# Patient Record
Sex: Female | Born: 1950
Health system: Southern US, Community
[De-identification: ages and names within clinical notes are randomized; demographics above are authoritative.]

## PROBLEM LIST (undated history)

## (undated) DIAGNOSIS — Z955 Presence of coronary angioplasty implant and graft: Secondary | ICD-10-CM

## (undated) DIAGNOSIS — Z9289 Personal history of other medical treatment: Secondary | ICD-10-CM

## (undated) DIAGNOSIS — C801 Malignant (primary) neoplasm, unspecified: Secondary | ICD-10-CM

## (undated) DIAGNOSIS — Z8719 Personal history of other diseases of the digestive system: Secondary | ICD-10-CM

## (undated) DIAGNOSIS — G4730 Sleep apnea, unspecified: Secondary | ICD-10-CM

## (undated) DIAGNOSIS — C3490 Malignant neoplasm of unspecified part of unspecified bronchus or lung: Secondary | ICD-10-CM

## (undated) DIAGNOSIS — I509 Heart failure, unspecified: Secondary | ICD-10-CM

## (undated) DIAGNOSIS — K219 Gastro-esophageal reflux disease without esophagitis: Secondary | ICD-10-CM

## (undated) DIAGNOSIS — M1990 Unspecified osteoarthritis, unspecified site: Secondary | ICD-10-CM

## (undated) DIAGNOSIS — E119 Type 2 diabetes mellitus without complications: Secondary | ICD-10-CM

## (undated) DIAGNOSIS — I214 Non-ST elevation (NSTEMI) myocardial infarction: Secondary | ICD-10-CM

## (undated) DIAGNOSIS — I10 Essential (primary) hypertension: Secondary | ICD-10-CM

## (undated) DIAGNOSIS — E7800 Pure hypercholesterolemia, unspecified: Secondary | ICD-10-CM

## (undated) DIAGNOSIS — I480 Paroxysmal atrial fibrillation: Secondary | ICD-10-CM

## (undated) DIAGNOSIS — I2510 Atherosclerotic heart disease of native coronary artery without angina pectoris: Secondary | ICD-10-CM

## (undated) DIAGNOSIS — I739 Peripheral vascular disease, unspecified: Secondary | ICD-10-CM

## (undated) HISTORY — PX: BREAST EXCISIONAL BIOPSY: SUR124

## (undated) HISTORY — PX: CORONARY ANGIOPLASTY WITH STENT PLACEMENT: SHX49

## (undated) HISTORY — DX: Pure hypercholesterolemia, unspecified: E78.00

## (undated) HISTORY — PX: DILATION AND CURETTAGE OF UTERUS: SHX78

## (undated) HISTORY — PX: JOINT REPLACEMENT: SHX530

## (undated) HISTORY — PX: TONSILLECTOMY: SUR1361

## (undated) HISTORY — PX: LAPAROSCOPIC CHOLECYSTECTOMY: SUR755

## (undated) HISTORY — PX: FRACTURE SURGERY: SHX138

## (undated) HISTORY — DX: Essential (primary) hypertension: I10

## (undated) HISTORY — DX: Heart failure, unspecified: I50.9

## (undated) HISTORY — PX: KNEE ARTHROSCOPY: SHX127

---

## 1988-09-27 HISTORY — PX: ABDOMINAL HYSTERECTOMY: SHX81

## 1991-09-28 HISTORY — PX: HAMMER TOE SURGERY: SHX385

## 1998-03-15 ENCOUNTER — Ambulatory Visit (HOSPITAL_COMMUNITY): Admission: RE | Admit: 1998-03-15 | Discharge: 1998-03-15 | Payer: Self-pay | Admitting: Orthopedic Surgery

## 1998-06-11 ENCOUNTER — Ambulatory Visit (HOSPITAL_COMMUNITY): Admission: RE | Admit: 1998-06-11 | Discharge: 1998-06-11 | Payer: Self-pay | Admitting: *Deleted

## 1999-07-14 ENCOUNTER — Encounter: Payer: Self-pay | Admitting: Internal Medicine

## 1999-07-14 ENCOUNTER — Encounter: Admission: RE | Admit: 1999-07-14 | Discharge: 1999-07-14 | Payer: Self-pay | Admitting: Internal Medicine

## 2000-07-18 ENCOUNTER — Encounter: Payer: Self-pay | Admitting: Gynecology

## 2000-07-18 ENCOUNTER — Encounter: Admission: RE | Admit: 2000-07-18 | Discharge: 2000-07-18 | Payer: Self-pay | Admitting: Gynecology

## 2001-07-19 ENCOUNTER — Encounter: Admission: RE | Admit: 2001-07-19 | Discharge: 2001-07-19 | Payer: Self-pay | Admitting: Gynecology

## 2001-07-19 ENCOUNTER — Encounter: Payer: Self-pay | Admitting: Gynecology

## 2001-07-26 ENCOUNTER — Ambulatory Visit (HOSPITAL_COMMUNITY): Admission: RE | Admit: 2001-07-26 | Discharge: 2001-07-26 | Payer: Self-pay | Admitting: Internal Medicine

## 2001-08-22 ENCOUNTER — Other Ambulatory Visit: Admission: RE | Admit: 2001-08-22 | Discharge: 2001-08-22 | Payer: Self-pay | Admitting: Gynecology

## 2001-09-27 HISTORY — PX: TOTAL KNEE ARTHROPLASTY: SHX125

## 2001-12-18 ENCOUNTER — Encounter: Payer: Self-pay | Admitting: Internal Medicine

## 2001-12-18 ENCOUNTER — Encounter: Admission: RE | Admit: 2001-12-18 | Discharge: 2001-12-18 | Payer: Self-pay | Admitting: Internal Medicine

## 2002-02-20 HISTORY — PX: CARDIAC CATHETERIZATION: SHX172

## 2002-04-02 ENCOUNTER — Ambulatory Visit (HOSPITAL_COMMUNITY): Admission: RE | Admit: 2002-04-02 | Discharge: 2002-04-02 | Payer: Self-pay | Admitting: Gastroenterology

## 2002-04-05 ENCOUNTER — Emergency Department (HOSPITAL_COMMUNITY): Admission: EM | Admit: 2002-04-05 | Discharge: 2002-04-06 | Payer: Self-pay | Admitting: Emergency Medicine

## 2002-04-06 ENCOUNTER — Ambulatory Visit (HOSPITAL_COMMUNITY): Admission: RE | Admit: 2002-04-06 | Discharge: 2002-04-06 | Payer: Self-pay | Admitting: Gastroenterology

## 2002-07-20 ENCOUNTER — Encounter: Admission: RE | Admit: 2002-07-20 | Discharge: 2002-07-20 | Payer: Self-pay | Admitting: Gynecology

## 2002-07-20 ENCOUNTER — Encounter: Payer: Self-pay | Admitting: Gynecology

## 2002-08-15 ENCOUNTER — Other Ambulatory Visit: Admission: RE | Admit: 2002-08-15 | Discharge: 2002-08-15 | Payer: Self-pay | Admitting: Gynecology

## 2002-12-05 ENCOUNTER — Encounter: Payer: Self-pay | Admitting: Gastroenterology

## 2002-12-05 ENCOUNTER — Encounter: Admission: RE | Admit: 2002-12-05 | Discharge: 2002-12-05 | Payer: Self-pay | Admitting: Gastroenterology

## 2003-05-13 ENCOUNTER — Ambulatory Visit (HOSPITAL_BASED_OUTPATIENT_CLINIC_OR_DEPARTMENT_OTHER): Admission: RE | Admit: 2003-05-13 | Discharge: 2003-05-13 | Payer: Self-pay | Admitting: Urology

## 2003-05-24 ENCOUNTER — Encounter: Payer: Self-pay | Admitting: Orthopedic Surgery

## 2003-05-28 ENCOUNTER — Inpatient Hospital Stay (HOSPITAL_COMMUNITY): Admission: RE | Admit: 2003-05-28 | Discharge: 2003-06-04 | Payer: Self-pay | Admitting: Orthopedic Surgery

## 2003-05-28 ENCOUNTER — Encounter: Payer: Self-pay | Admitting: Orthopedic Surgery

## 2003-07-22 ENCOUNTER — Ambulatory Visit (HOSPITAL_COMMUNITY): Admission: RE | Admit: 2003-07-22 | Discharge: 2003-07-22 | Payer: Self-pay | Admitting: Orthopedic Surgery

## 2003-07-24 ENCOUNTER — Encounter: Admission: RE | Admit: 2003-07-24 | Discharge: 2003-07-24 | Payer: Self-pay | Admitting: Gynecology

## 2003-09-09 ENCOUNTER — Other Ambulatory Visit: Admission: RE | Admit: 2003-09-09 | Discharge: 2003-09-09 | Payer: Self-pay | Admitting: Gynecology

## 2003-12-31 ENCOUNTER — Inpatient Hospital Stay (HOSPITAL_COMMUNITY): Admission: RE | Admit: 2003-12-31 | Discharge: 2004-01-02 | Payer: Self-pay | Admitting: Orthopedic Surgery

## 2004-08-17 ENCOUNTER — Encounter: Admission: RE | Admit: 2004-08-17 | Discharge: 2004-08-17 | Payer: Self-pay | Admitting: Gastroenterology

## 2004-08-21 ENCOUNTER — Ambulatory Visit (HOSPITAL_COMMUNITY): Admission: RE | Admit: 2004-08-21 | Discharge: 2004-08-21 | Payer: Self-pay | Admitting: Gynecology

## 2004-09-04 ENCOUNTER — Ambulatory Visit (HOSPITAL_COMMUNITY): Admission: RE | Admit: 2004-09-04 | Discharge: 2004-09-04 | Payer: Self-pay | Admitting: Gastroenterology

## 2004-10-06 ENCOUNTER — Inpatient Hospital Stay (HOSPITAL_COMMUNITY): Admission: RE | Admit: 2004-10-06 | Discharge: 2004-10-09 | Payer: Self-pay | Admitting: Orthopedic Surgery

## 2005-01-21 ENCOUNTER — Emergency Department (HOSPITAL_COMMUNITY): Admission: EM | Admit: 2005-01-21 | Discharge: 2005-01-22 | Payer: Self-pay | Admitting: Emergency Medicine

## 2005-09-27 HISTORY — PX: TOTAL KNEE REVISION: SHX996

## 2005-09-28 ENCOUNTER — Encounter: Admission: RE | Admit: 2005-09-28 | Discharge: 2005-09-28 | Payer: Self-pay | Admitting: Gynecology

## 2005-09-28 ENCOUNTER — Other Ambulatory Visit: Admission: RE | Admit: 2005-09-28 | Discharge: 2005-09-28 | Payer: Self-pay | Admitting: Gynecology

## 2006-01-12 ENCOUNTER — Encounter: Admission: RE | Admit: 2006-01-12 | Discharge: 2006-01-12 | Payer: Self-pay | Admitting: Orthopedic Surgery

## 2006-02-01 ENCOUNTER — Encounter (INDEPENDENT_AMBULATORY_CARE_PROVIDER_SITE_OTHER): Payer: Self-pay | Admitting: Specialist

## 2006-02-01 ENCOUNTER — Inpatient Hospital Stay (HOSPITAL_COMMUNITY): Admission: RE | Admit: 2006-02-01 | Discharge: 2006-02-08 | Payer: Self-pay | Admitting: Orthopedic Surgery

## 2006-02-02 ENCOUNTER — Encounter (INDEPENDENT_AMBULATORY_CARE_PROVIDER_SITE_OTHER): Payer: Self-pay | Admitting: Cardiovascular Disease

## 2006-02-04 ENCOUNTER — Ambulatory Visit: Payer: Self-pay | Admitting: Physical Medicine & Rehabilitation

## 2006-10-04 ENCOUNTER — Encounter: Admission: RE | Admit: 2006-10-04 | Discharge: 2006-10-04 | Payer: Self-pay | Admitting: Gynecology

## 2006-10-31 ENCOUNTER — Other Ambulatory Visit: Admission: RE | Admit: 2006-10-31 | Discharge: 2006-10-31 | Payer: Self-pay | Admitting: Gynecology

## 2006-12-02 ENCOUNTER — Emergency Department (HOSPITAL_COMMUNITY): Admission: EM | Admit: 2006-12-02 | Discharge: 2006-12-02 | Payer: Self-pay | Admitting: Emergency Medicine

## 2007-04-10 ENCOUNTER — Encounter: Admission: RE | Admit: 2007-04-10 | Discharge: 2007-04-10 | Payer: Self-pay | Admitting: Orthopedic Surgery

## 2008-01-03 ENCOUNTER — Ambulatory Visit (HOSPITAL_COMMUNITY): Admission: RE | Admit: 2008-01-03 | Discharge: 2008-01-03 | Payer: Self-pay | Admitting: Gynecology

## 2008-02-14 ENCOUNTER — Emergency Department (HOSPITAL_COMMUNITY): Admission: EM | Admit: 2008-02-14 | Discharge: 2008-02-14 | Payer: Self-pay | Admitting: Family Medicine

## 2008-02-15 ENCOUNTER — Ambulatory Visit: Payer: Self-pay | Admitting: *Deleted

## 2008-04-22 ENCOUNTER — Emergency Department (HOSPITAL_COMMUNITY): Admission: EM | Admit: 2008-04-22 | Discharge: 2008-04-22 | Payer: Self-pay | Admitting: Emergency Medicine

## 2008-05-30 ENCOUNTER — Ambulatory Visit: Payer: Self-pay | Admitting: Internal Medicine

## 2008-05-30 LAB — CONVERTED CEMR LAB
Basophils Absolute: 0 10*3/uL (ref 0.0–0.1)
Hemoglobin: 13.1 g/dL (ref 12.0–15.0)
Lymphocytes Relative: 26 % (ref 12–46)
Neutro Abs: 4.2 10*3/uL (ref 1.7–7.7)
Neutrophils Relative %: 64 % (ref 43–77)
Platelets: 230 10*3/uL (ref 150–400)
RDW: 13.4 % (ref 11.5–15.5)
TSH: 1.261 microintl units/mL (ref 0.350–4.50)

## 2008-05-31 ENCOUNTER — Ambulatory Visit: Payer: Self-pay | Admitting: Internal Medicine

## 2008-07-03 ENCOUNTER — Encounter: Payer: Self-pay | Admitting: Internal Medicine

## 2008-07-03 ENCOUNTER — Ambulatory Visit: Payer: Self-pay | Admitting: Internal Medicine

## 2008-10-10 ENCOUNTER — Ambulatory Visit: Payer: Self-pay | Admitting: Internal Medicine

## 2008-10-10 LAB — CONVERTED CEMR LAB
Cholesterol: 201 mg/dL — ABNORMAL HIGH (ref 0–200)
Potassium: 4 meq/L (ref 3.5–5.3)
Sodium: 141 meq/L (ref 135–145)
Total CHOL/HDL Ratio: 4
Triglycerides: 84 mg/dL (ref <150)
VLDL: 17 mg/dL (ref 0–40)

## 2008-10-11 ENCOUNTER — Encounter (INDEPENDENT_AMBULATORY_CARE_PROVIDER_SITE_OTHER): Payer: Self-pay | Admitting: Internal Medicine

## 2008-11-02 ENCOUNTER — Ambulatory Visit: Payer: Self-pay | Admitting: Pulmonary Disease

## 2008-11-02 ENCOUNTER — Inpatient Hospital Stay (HOSPITAL_COMMUNITY): Admission: EM | Admit: 2008-11-02 | Discharge: 2008-11-05 | Payer: Self-pay | Admitting: Emergency Medicine

## 2008-11-04 ENCOUNTER — Encounter (INDEPENDENT_AMBULATORY_CARE_PROVIDER_SITE_OTHER): Payer: Self-pay | Admitting: Cardiology

## 2008-11-04 HISTORY — PX: CARDIAC CATHETERIZATION: SHX172

## 2008-11-06 ENCOUNTER — Ambulatory Visit: Payer: Self-pay | Admitting: Internal Medicine

## 2008-11-06 ENCOUNTER — Telehealth (INDEPENDENT_AMBULATORY_CARE_PROVIDER_SITE_OTHER): Payer: Self-pay | Admitting: *Deleted

## 2008-11-13 ENCOUNTER — Ambulatory Visit: Payer: Self-pay | Admitting: Internal Medicine

## 2008-11-27 ENCOUNTER — Ambulatory Visit: Payer: Self-pay | Admitting: Pulmonary Disease

## 2008-11-27 ENCOUNTER — Telehealth (INDEPENDENT_AMBULATORY_CARE_PROVIDER_SITE_OTHER): Payer: Self-pay | Admitting: *Deleted

## 2008-11-27 DIAGNOSIS — Z794 Long term (current) use of insulin: Secondary | ICD-10-CM

## 2008-11-27 DIAGNOSIS — IMO0001 Reserved for inherently not codable concepts without codable children: Secondary | ICD-10-CM | POA: Insufficient documentation

## 2008-11-27 DIAGNOSIS — E119 Type 2 diabetes mellitus without complications: Secondary | ICD-10-CM

## 2008-11-27 DIAGNOSIS — I10 Essential (primary) hypertension: Secondary | ICD-10-CM | POA: Insufficient documentation

## 2008-11-27 DIAGNOSIS — J984 Other disorders of lung: Secondary | ICD-10-CM | POA: Insufficient documentation

## 2008-12-16 ENCOUNTER — Ambulatory Visit: Payer: Self-pay | Admitting: Cardiology

## 2008-12-20 ENCOUNTER — Encounter: Payer: Self-pay | Admitting: Pulmonary Disease

## 2008-12-25 ENCOUNTER — Ambulatory Visit (HOSPITAL_COMMUNITY): Admission: RE | Admit: 2008-12-25 | Discharge: 2008-12-25 | Payer: Self-pay | Admitting: Pulmonary Disease

## 2009-01-01 ENCOUNTER — Telehealth: Payer: Self-pay | Admitting: Pulmonary Disease

## 2009-01-02 ENCOUNTER — Ambulatory Visit: Payer: Self-pay | Admitting: Pulmonary Disease

## 2009-01-06 ENCOUNTER — Ambulatory Visit (HOSPITAL_COMMUNITY): Admission: RE | Admit: 2009-01-06 | Discharge: 2009-01-06 | Payer: Self-pay | Admitting: Internal Medicine

## 2009-01-08 ENCOUNTER — Ambulatory Visit (HOSPITAL_COMMUNITY): Admission: RE | Admit: 2009-01-08 | Discharge: 2009-01-08 | Payer: Self-pay | Admitting: Pulmonary Disease

## 2009-01-08 ENCOUNTER — Encounter: Payer: Self-pay | Admitting: Pulmonary Disease

## 2009-01-08 ENCOUNTER — Encounter (INDEPENDENT_AMBULATORY_CARE_PROVIDER_SITE_OTHER): Payer: Self-pay | Admitting: Interventional Radiology

## 2009-01-13 ENCOUNTER — Ambulatory Visit: Payer: Self-pay | Admitting: Pulmonary Disease

## 2009-01-13 DIAGNOSIS — C3492 Malignant neoplasm of unspecified part of left bronchus or lung: Secondary | ICD-10-CM | POA: Insufficient documentation

## 2009-01-17 ENCOUNTER — Ambulatory Visit: Admission: RE | Admit: 2009-01-17 | Discharge: 2009-01-17 | Payer: Self-pay | Admitting: Pulmonary Disease

## 2009-01-17 ENCOUNTER — Encounter: Payer: Self-pay | Admitting: Pulmonary Disease

## 2009-01-22 ENCOUNTER — Ambulatory Visit: Payer: Self-pay | Admitting: Thoracic Surgery

## 2009-01-25 DIAGNOSIS — C801 Malignant (primary) neoplasm, unspecified: Secondary | ICD-10-CM

## 2009-01-25 HISTORY — DX: Malignant (primary) neoplasm, unspecified: C80.1

## 2009-01-25 HISTORY — PX: VIDEO ASSISTED THORACOSCOPY (VATS)/ LOBECTOMY: SHX6169

## 2009-02-11 ENCOUNTER — Encounter: Payer: Self-pay | Admitting: Pulmonary Disease

## 2009-02-11 ENCOUNTER — Encounter: Payer: Self-pay | Admitting: Thoracic Surgery

## 2009-02-11 ENCOUNTER — Inpatient Hospital Stay (HOSPITAL_COMMUNITY): Admission: RE | Admit: 2009-02-11 | Discharge: 2009-02-17 | Payer: Self-pay | Admitting: Thoracic Surgery

## 2009-02-11 ENCOUNTER — Ambulatory Visit: Payer: Self-pay | Admitting: Thoracic Surgery

## 2009-02-26 ENCOUNTER — Ambulatory Visit: Payer: Self-pay | Admitting: Thoracic Surgery

## 2009-02-26 ENCOUNTER — Encounter: Admission: RE | Admit: 2009-02-26 | Discharge: 2009-02-26 | Payer: Self-pay | Admitting: Thoracic Surgery

## 2009-03-05 ENCOUNTER — Ambulatory Visit: Payer: Self-pay | Admitting: Thoracic Surgery

## 2009-03-05 ENCOUNTER — Ambulatory Visit: Payer: Self-pay | Admitting: Internal Medicine

## 2009-03-05 ENCOUNTER — Encounter: Admission: RE | Admit: 2009-03-05 | Discharge: 2009-03-05 | Payer: Self-pay | Admitting: Thoracic Surgery

## 2009-03-19 LAB — CBC WITH DIFFERENTIAL/PLATELET
BASO%: 0.5 % (ref 0.0–2.0)
Eosinophils Absolute: 0.3 10*3/uL (ref 0.0–0.5)
MCHC: 35.4 g/dL (ref 31.5–36.0)
MCV: 84.9 fL (ref 79.5–101.0)
MONO#: 0.5 10*3/uL (ref 0.1–0.9)
MONO%: 6.3 % (ref 0.0–14.0)
NEUT#: 5.4 10*3/uL (ref 1.5–6.5)
RBC: 4.47 10*6/uL (ref 3.70–5.45)
RDW: 13.3 % (ref 11.2–14.5)
WBC: 7.6 10*3/uL (ref 3.9–10.3)

## 2009-03-19 LAB — COMPREHENSIVE METABOLIC PANEL
ALT: 17 U/L (ref 0–35)
Albumin: 3.8 g/dL (ref 3.5–5.2)
Alkaline Phosphatase: 96 U/L (ref 39–117)
Glucose, Bld: 194 mg/dL — ABNORMAL HIGH (ref 70–99)
Potassium: 3.8 mEq/L (ref 3.5–5.3)
Sodium: 138 mEq/L (ref 135–145)
Total Protein: 7.8 g/dL (ref 6.0–8.3)

## 2009-03-26 ENCOUNTER — Encounter: Admission: RE | Admit: 2009-03-26 | Discharge: 2009-03-26 | Payer: Self-pay | Admitting: Thoracic Surgery

## 2009-03-26 ENCOUNTER — Ambulatory Visit: Payer: Self-pay | Admitting: Thoracic Surgery

## 2009-05-28 ENCOUNTER — Encounter: Admission: RE | Admit: 2009-05-28 | Discharge: 2009-05-28 | Payer: Self-pay | Admitting: Thoracic Surgery

## 2009-05-28 ENCOUNTER — Ambulatory Visit: Payer: Self-pay | Admitting: Thoracic Surgery

## 2009-05-28 ENCOUNTER — Ambulatory Visit: Payer: Self-pay | Admitting: Internal Medicine

## 2009-05-29 ENCOUNTER — Encounter (INDEPENDENT_AMBULATORY_CARE_PROVIDER_SITE_OTHER): Payer: Self-pay | Admitting: Internal Medicine

## 2009-07-08 ENCOUNTER — Encounter: Admission: RE | Admit: 2009-07-08 | Discharge: 2009-07-08 | Payer: Self-pay | Admitting: Internal Medicine

## 2009-07-09 ENCOUNTER — Ambulatory Visit: Payer: Self-pay | Admitting: Internal Medicine

## 2009-07-09 ENCOUNTER — Encounter: Payer: Self-pay | Admitting: Internal Medicine

## 2009-07-09 ENCOUNTER — Other Ambulatory Visit: Admission: RE | Admit: 2009-07-09 | Discharge: 2009-07-09 | Payer: Self-pay | Admitting: Internal Medicine

## 2009-07-09 LAB — CONVERTED CEMR LAB
CO2: 23 meq/L (ref 19–32)
Calcium: 9 mg/dL (ref 8.4–10.5)
Creatinine, Ser: 0.69 mg/dL (ref 0.40–1.20)
Glucose, Bld: 174 mg/dL — ABNORMAL HIGH (ref 70–99)
Microalb, Ur: 1.24 mg/dL (ref 0.00–1.89)
Sodium: 141 meq/L (ref 135–145)
Total Bilirubin: 0.5 mg/dL (ref 0.3–1.2)
Total Protein: 7.1 g/dL (ref 6.0–8.3)

## 2009-07-10 ENCOUNTER — Ambulatory Visit: Payer: Self-pay | Admitting: Internal Medicine

## 2009-08-30 ENCOUNTER — Emergency Department (HOSPITAL_COMMUNITY): Admission: EM | Admit: 2009-08-30 | Discharge: 2009-08-31 | Payer: Self-pay | Admitting: Emergency Medicine

## 2009-09-09 ENCOUNTER — Emergency Department (HOSPITAL_COMMUNITY): Admission: EM | Admit: 2009-09-09 | Discharge: 2009-09-09 | Payer: Self-pay | Admitting: Emergency Medicine

## 2009-09-10 ENCOUNTER — Ambulatory Visit: Payer: Self-pay | Admitting: Internal Medicine

## 2009-09-12 LAB — CBC WITH DIFFERENTIAL/PLATELET
Basophils Absolute: 0 10*3/uL (ref 0.0–0.1)
Eosinophils Absolute: 0.1 10*3/uL (ref 0.0–0.5)
HGB: 14.2 g/dL (ref 11.6–15.9)
MCV: 83.7 fL (ref 79.5–101.0)
MONO#: 0.4 10*3/uL (ref 0.1–0.9)
NEUT#: 4.9 10*3/uL (ref 1.5–6.5)
RDW: 12.9 % (ref 11.2–14.5)
lymph#: 1.8 10*3/uL (ref 0.9–3.3)

## 2009-09-12 LAB — COMPREHENSIVE METABOLIC PANEL
Albumin: 3.7 g/dL (ref 3.5–5.2)
CO2: 29 mEq/L (ref 19–32)
Glucose, Bld: 213 mg/dL — ABNORMAL HIGH (ref 70–99)
Potassium: 3.5 mEq/L (ref 3.5–5.3)
Sodium: 138 mEq/L (ref 135–145)
Total Protein: 8.6 g/dL — ABNORMAL HIGH (ref 6.0–8.3)

## 2009-09-30 ENCOUNTER — Encounter: Admission: RE | Admit: 2009-09-30 | Discharge: 2009-09-30 | Payer: Self-pay | Admitting: Thoracic Surgery

## 2009-09-30 ENCOUNTER — Ambulatory Visit: Payer: Self-pay | Admitting: Thoracic Surgery

## 2009-10-02 ENCOUNTER — Ambulatory Visit: Payer: Self-pay | Admitting: Internal Medicine

## 2009-10-02 LAB — CONVERTED CEMR LAB
ALT: 16 units/L (ref 0–35)
AST: 18 units/L (ref 0–37)
Albumin: 4.2 g/dL (ref 3.5–5.2)
Alkaline Phosphatase: 105 units/L (ref 39–117)
BUN: 9 mg/dL (ref 6–23)
Calcium: 9.2 mg/dL (ref 8.4–10.5)
Chloride: 96 meq/L (ref 96–112)
Eosinophils Relative: 1 % (ref 0–5)
HCT: 39.9 % (ref 36.0–46.0)
Lymphocytes Relative: 19 % (ref 12–46)
Lymphs Abs: 1.4 10*3/uL (ref 0.7–4.0)
Neutro Abs: 5.4 10*3/uL (ref 1.7–7.7)
Platelets: 240 10*3/uL (ref 150–400)
Potassium: 3.4 meq/L — ABNORMAL LOW (ref 3.5–5.3)
Sodium: 137 meq/L (ref 135–145)
Total Protein: 7.9 g/dL (ref 6.0–8.3)
WBC: 7.3 10*3/uL (ref 4.0–10.5)

## 2009-10-07 ENCOUNTER — Emergency Department (HOSPITAL_COMMUNITY): Admission: EM | Admit: 2009-10-07 | Discharge: 2009-10-07 | Payer: Self-pay | Admitting: Emergency Medicine

## 2009-10-15 ENCOUNTER — Ambulatory Visit: Payer: Self-pay | Admitting: Internal Medicine

## 2009-11-21 ENCOUNTER — Ambulatory Visit: Payer: Self-pay | Admitting: Internal Medicine

## 2009-11-21 LAB — CONVERTED CEMR LAB
CRP: 2.5 mg/dL — ABNORMAL HIGH (ref <0.6)
LDL Cholesterol: 76 mg/dL (ref 0–99)
VLDL: 16 mg/dL (ref 0–40)

## 2010-01-08 ENCOUNTER — Ambulatory Visit (HOSPITAL_COMMUNITY): Admission: RE | Admit: 2010-01-08 | Discharge: 2010-01-08 | Payer: Self-pay | Admitting: Internal Medicine

## 2010-02-24 ENCOUNTER — Ambulatory Visit: Payer: Self-pay | Admitting: Vascular Surgery

## 2010-02-24 ENCOUNTER — Inpatient Hospital Stay (HOSPITAL_COMMUNITY): Admission: EM | Admit: 2010-02-24 | Discharge: 2010-02-25 | Payer: Self-pay | Admitting: Emergency Medicine

## 2010-02-24 ENCOUNTER — Encounter (INDEPENDENT_AMBULATORY_CARE_PROVIDER_SITE_OTHER): Payer: Self-pay | Admitting: Internal Medicine

## 2010-03-11 ENCOUNTER — Ambulatory Visit: Payer: Self-pay | Admitting: Internal Medicine

## 2010-03-13 LAB — COMPREHENSIVE METABOLIC PANEL
ALT: 18 U/L (ref 0–35)
AST: 21 U/L (ref 0–37)
Albumin: 3.6 g/dL (ref 3.5–5.2)
Alkaline Phosphatase: 77 U/L (ref 39–117)
Calcium: 9.6 mg/dL (ref 8.4–10.5)
Chloride: 99 mEq/L (ref 96–112)
Potassium: 3.9 mEq/L (ref 3.5–5.3)
Sodium: 137 mEq/L (ref 135–145)
Total Protein: 7.7 g/dL (ref 6.0–8.3)

## 2010-03-13 LAB — CBC WITH DIFFERENTIAL/PLATELET
BASO%: 0.5 % (ref 0.0–2.0)
EOS%: 2 % (ref 0.0–7.0)
HCT: 39.2 % (ref 34.8–46.6)
HGB: 13.5 g/dL (ref 11.6–15.9)
MCH: 29 pg (ref 25.1–34.0)
MCHC: 34.4 g/dL (ref 31.5–36.0)
MONO#: 0.4 10*3/uL (ref 0.1–0.9)
NEUT%: 64.1 % (ref 38.4–76.8)
RDW: 12.8 % (ref 11.2–14.5)
WBC: 5.9 10*3/uL (ref 3.9–10.3)
lymph#: 1.5 10*3/uL (ref 0.9–3.3)

## 2010-03-18 ENCOUNTER — Ambulatory Visit: Payer: Self-pay | Admitting: Internal Medicine

## 2010-03-18 LAB — CONVERTED CEMR LAB: Microalb, Ur: 1.29 mg/dL (ref 0.00–1.89)

## 2010-04-22 ENCOUNTER — Ambulatory Visit: Payer: Self-pay | Admitting: Internal Medicine

## 2010-09-11 ENCOUNTER — Ambulatory Visit: Payer: Self-pay | Admitting: Internal Medicine

## 2010-09-14 ENCOUNTER — Ambulatory Visit (HOSPITAL_COMMUNITY)
Admission: RE | Admit: 2010-09-14 | Discharge: 2010-09-14 | Payer: Self-pay | Source: Home / Self Care | Attending: Internal Medicine | Admitting: Internal Medicine

## 2010-09-14 LAB — CBC WITH DIFFERENTIAL/PLATELET
BASO%: 0.6 % (ref 0.0–2.0)
Basophils Absolute: 0 10*3/uL (ref 0.0–0.1)
EOS%: 2.4 % (ref 0.0–7.0)
MCH: 29.9 pg (ref 25.1–34.0)
MCHC: 34.6 g/dL (ref 31.5–36.0)
MCV: 86.3 fL (ref 79.5–101.0)
MONO%: 6.6 % (ref 0.0–14.0)
RBC: 4.41 10*6/uL (ref 3.70–5.45)
RDW: 13.1 % (ref 11.2–14.5)
lymph#: 1.3 10*3/uL (ref 0.9–3.3)

## 2010-09-14 LAB — CMP (CANCER CENTER ONLY)
ALT(SGPT): 18 U/L (ref 10–47)
AST: 23 U/L (ref 11–38)
Albumin: 3.4 g/dL (ref 3.3–5.5)
Alkaline Phosphatase: 93 U/L — ABNORMAL HIGH (ref 26–84)
BUN, Bld: 14 mg/dL (ref 7–22)
Potassium: 3.9 mEq/L (ref 3.3–4.7)
Sodium: 134 mEq/L (ref 128–145)

## 2010-09-16 ENCOUNTER — Encounter: Payer: Self-pay | Admitting: Pulmonary Disease

## 2010-09-30 ENCOUNTER — Encounter (INDEPENDENT_AMBULATORY_CARE_PROVIDER_SITE_OTHER): Payer: Self-pay | Admitting: *Deleted

## 2010-09-30 LAB — CONVERTED CEMR LAB
AST: 15 units/L (ref 0–37)
Albumin: 3.9 g/dL (ref 3.5–5.2)
Alkaline Phosphatase: 82 units/L (ref 39–117)
LDL Cholesterol: 70 mg/dL (ref 0–99)
Potassium: 4.2 meq/L (ref 3.5–5.3)
Sodium: 137 meq/L (ref 135–145)
Total Bilirubin: 0.6 mg/dL (ref 0.3–1.2)
Total Protein: 7.4 g/dL (ref 6.0–8.3)
Triglycerides: 70 mg/dL (ref <150)
VLDL: 14 mg/dL (ref 0–40)

## 2010-10-04 ENCOUNTER — Emergency Department (HOSPITAL_COMMUNITY)
Admission: EM | Admit: 2010-10-04 | Discharge: 2010-10-04 | Payer: Self-pay | Source: Home / Self Care | Admitting: Emergency Medicine

## 2010-10-08 ENCOUNTER — Emergency Department (HOSPITAL_COMMUNITY)
Admission: EM | Admit: 2010-10-08 | Discharge: 2010-10-08 | Payer: Self-pay | Source: Home / Self Care | Admitting: Emergency Medicine

## 2010-10-12 LAB — COMPREHENSIVE METABOLIC PANEL
ALT: 17 U/L (ref 0–35)
AST: 22 U/L (ref 0–37)
Albumin: 3.8 g/dL (ref 3.5–5.2)
Alkaline Phosphatase: 93 U/L (ref 39–117)
BUN: 12 mg/dL (ref 6–23)
CO2: 30 mEq/L (ref 19–32)
Calcium: 9.8 mg/dL (ref 8.4–10.5)
Chloride: 97 mEq/L (ref 96–112)
Creatinine, Ser: 0.95 mg/dL (ref 0.4–1.2)
GFR calc Af Amer: >60 mL/min (ref >60)
GFR calc non Af Amer: >60 mL/min (ref >60)
Glucose, Bld: 217 mg/dL — ABNORMAL HIGH (ref 70–99)
Potassium: 3.7 mEq/L (ref 3.5–5.1)
Sodium: 137 mEq/L (ref 135–145)
Total Bilirubin: 0.6 mg/dL (ref 0.3–1.2)
Total Protein: 8.5 g/dL — ABNORMAL HIGH (ref 6.0–8.3)

## 2010-10-12 LAB — CBC
HCT: 39.9 % (ref 36.0–46.0)
HCT: 42.1 % (ref 36.0–46.0)
Hemoglobin: 13.8 g/dL (ref 12.0–15.0)
Hemoglobin: 14.4 g/dL (ref 12.0–15.0)
MCH: 29.7 pg (ref 26.0–34.0)
MCH: 29.4 pg (ref 26.0–34.0)
MCHC: 34.6 g/dL (ref 30.0–36.0)
MCHC: 34.2 g/dL (ref 30.0–36.0)
MCV: 86 fL (ref 78.0–100.0)
MCV: 85.9 fL (ref 78.0–100.0)
Platelets: 224 10*3/uL (ref 150–400)
RBC: 4.64 MIL/uL (ref 3.87–5.11)
RBC: 4.9 MIL/uL (ref 3.87–5.11)
RDW: 12.5 % (ref 11.5–15.5)
WBC: 9.2 10*3/uL (ref 4.0–10.5)

## 2010-10-12 LAB — BASIC METABOLIC PANEL
BUN: 11 mg/dL (ref 6–23)
CO2: 30 mEq/L (ref 19–32)
Calcium: 9.9 mg/dL (ref 8.4–10.5)
Chloride: 97 mEq/L (ref 96–112)
Creatinine, Ser: 0.79 mg/dL (ref 0.4–1.2)
GFR calc Af Amer: >60 mL/min (ref >60)
GFR calc non Af Amer: >60 mL/min (ref >60)
Glucose, Bld: 254 mg/dL — ABNORMAL HIGH (ref 70–99)
Potassium: 3.7 mEq/L (ref 3.5–5.1)
Sodium: 137 mEq/L (ref 135–145)

## 2010-10-12 LAB — POCT CARDIAC MARKERS
CKMB, poc: 1.5 ng/mL (ref 1.0–8.0)
Myoglobin, poc: 225 ng/mL (ref 12–200)
Troponin i, poc: <0.05 ng/mL (ref 0.00–0.09)

## 2010-10-12 LAB — DIFFERENTIAL
Basophils Absolute: 0 10*3/uL (ref 0.0–0.1)
Basophils Relative: 0 % (ref 0–1)
Basophils Relative: 0 % (ref 0–1)
Eosinophils Absolute: 0.1 10*3/uL (ref 0.0–0.7)
Eosinophils Absolute: 0.1 10*3/uL (ref 0.0–0.7)
Eosinophils Relative: 1 % (ref 0–5)
Eosinophils Relative: 1 % (ref 0–5)
Lymphocytes Relative: 19 % (ref 12–46)
Lymphocytes Relative: 16 % (ref 12–46)
Lymphs Abs: 1.7 10*3/uL (ref 0.7–4.0)
Monocytes Absolute: 0.6 10*3/uL (ref 0.1–1.0)
Monocytes Absolute: 0.5 10*3/uL (ref 0.1–1.0)
Monocytes Relative: 6 % (ref 3–12)
Neutro Abs: 6.8 10*3/uL (ref 1.7–7.7)
Neutro Abs: 6.3 10*3/uL (ref 1.7–7.7)
Neutrophils Relative %: 73 % (ref 43–77)
Neutrophils Relative %: 77 % (ref 43–77)

## 2010-10-12 LAB — URINALYSIS, ROUTINE W REFLEX MICROSCOPIC
Bilirubin Urine: NEGATIVE
Hgb urine dipstick: NEGATIVE
Ketones, ur: NEGATIVE mg/dL
Nitrite: NEGATIVE
Protein, ur: NEGATIVE mg/dL
Specific Gravity, Urine: 1.009 (ref 1.005–1.030)
Urine Glucose, Fasting: 100 mg/dL — AB
Urobilinogen, UA: 0.2 mg/dL (ref 0.0–1.0)
pH: 6 (ref 5.0–8.0)

## 2010-10-12 LAB — LIPASE, BLOOD: Lipase: 21 U/L (ref 11–59)

## 2010-10-12 LAB — GLUCOSE, CAPILLARY: Glucose-Capillary: 242 mg/dL — ABNORMAL HIGH (ref 70–99)

## 2010-10-14 ENCOUNTER — Other Ambulatory Visit: Payer: Self-pay | Admitting: Gynecology

## 2010-10-16 ENCOUNTER — Other Ambulatory Visit: Payer: Self-pay | Admitting: Internal Medicine

## 2010-10-16 DIAGNOSIS — C3490 Malignant neoplasm of unspecified part of unspecified bronchus or lung: Secondary | ICD-10-CM

## 2010-10-18 ENCOUNTER — Encounter: Payer: Self-pay | Admitting: Gynecology

## 2010-10-18 ENCOUNTER — Encounter: Payer: Self-pay | Admitting: Internal Medicine

## 2010-11-27 ENCOUNTER — Other Ambulatory Visit (HOSPITAL_COMMUNITY): Payer: Self-pay | Admitting: Gynecology

## 2010-11-27 DIAGNOSIS — Z1231 Encounter for screening mammogram for malignant neoplasm of breast: Secondary | ICD-10-CM

## 2010-12-08 NOTE — Letter (Signed)
Summary: [facility]  [facility]   Imported By: Lester Menominee 09/30/2010 09:28:01  _____________________________________________________________________  External Attachment:    Type:   Image     Comment:   External Document

## 2010-12-13 LAB — COMPREHENSIVE METABOLIC PANEL
AST: 23 U/L (ref 0–37)
Albumin: 3.3 g/dL — ABNORMAL LOW (ref 3.5–5.2)
Alkaline Phosphatase: 85 U/L (ref 39–117)
Chloride: 97 mEq/L (ref 96–112)
GFR calc Af Amer: >60 mL/min (ref >60)
Potassium: 3.6 mEq/L (ref 3.5–5.1)
Total Bilirubin: 0.5 mg/dL (ref 0.3–1.2)

## 2010-12-13 LAB — URINALYSIS, ROUTINE W REFLEX MICROSCOPIC
Bilirubin Urine: NEGATIVE
Hgb urine dipstick: NEGATIVE
Protein, ur: NEGATIVE mg/dL
Specific Gravity, Urine: 1.009 (ref 1.005–1.030)
Urobilinogen, UA: 1 mg/dL (ref 0.0–1.0)

## 2010-12-13 LAB — DIFFERENTIAL
Basophils Absolute: 0 10*3/uL (ref 0.0–0.1)
Basophils Relative: 0 % (ref 0–1)
Eosinophils Absolute: 0.1 10*3/uL (ref 0.0–0.7)
Eosinophils Relative: 1 % (ref 0–5)
Monocytes Absolute: 0.3 10*3/uL (ref 0.1–1.0)

## 2010-12-13 LAB — GLUCOSE, CAPILLARY: Glucose-Capillary: 233 mg/dL — ABNORMAL HIGH (ref 70–99)

## 2010-12-13 LAB — CBC
Hemoglobin: 13.6 g/dL (ref 12.0–15.0)
MCHC: 34.7 g/dL (ref 30.0–36.0)
Platelets: 188 10*3/uL (ref 150–400)
RDW: 13.3 % (ref 11.5–15.5)

## 2010-12-14 LAB — CBC
HCT: 37.5 % (ref 36.0–46.0)
MCHC: 34.1 g/dL (ref 30.0–36.0)
Platelets: 171 10*3/uL (ref 150–400)
RBC: 4.9 MIL/uL (ref 3.87–5.11)
RDW: 13.4 % (ref 11.5–15.5)
WBC: 6.4 10*3/uL (ref 4.0–10.5)

## 2010-12-14 LAB — LIPASE, BLOOD
Lipase: 24 U/L (ref 11–59)
Lipase: 26 U/L (ref 11–59)

## 2010-12-14 LAB — COMPREHENSIVE METABOLIC PANEL
CO2: 30 mEq/L (ref 19–32)
Calcium: 9.1 mg/dL (ref 8.4–10.5)
Chloride: 99 mEq/L (ref 96–112)
Creatinine, Ser: 0.77 mg/dL (ref 0.4–1.2)
GFR calc non Af Amer: >60 mL/min (ref >60)
Glucose, Bld: 302 mg/dL — ABNORMAL HIGH (ref 70–99)
Total Bilirubin: 0.6 mg/dL (ref 0.3–1.2)

## 2010-12-14 LAB — LIPID PANEL
Cholesterol: 131 mg/dL (ref 0–200)
Total CHOL/HDL Ratio: 2.8 RATIO

## 2010-12-14 LAB — GLUCOSE, CAPILLARY
Glucose-Capillary: 209 mg/dL — ABNORMAL HIGH (ref 70–99)
Glucose-Capillary: 219 mg/dL — ABNORMAL HIGH (ref 70–99)
Glucose-Capillary: 224 mg/dL — ABNORMAL HIGH (ref 70–99)
Glucose-Capillary: 241 mg/dL — ABNORMAL HIGH (ref 70–99)
Glucose-Capillary: 245 mg/dL — ABNORMAL HIGH (ref 70–99)

## 2010-12-14 LAB — PROTIME-INR
INR: 1 (ref 0.00–1.49)
Prothrombin Time: 13.1 seconds (ref 11.6–15.2)

## 2010-12-14 LAB — POCT I-STAT, CHEM 8
Calcium, Ion: 1.08 mmol/L — ABNORMAL LOW (ref 1.12–1.32)
Chloride: 100 mEq/L (ref 96–112)
HCT: 41 % (ref 36.0–46.0)
Hemoglobin: 13.9 g/dL (ref 12.0–15.0)
TCO2: 30 mmol/L (ref 0–100)

## 2010-12-14 LAB — DIFFERENTIAL
Basophils Absolute: 0 10*3/uL (ref 0.0–0.1)
Basophils Relative: 1 % (ref 0–1)
Monocytes Relative: 5 % (ref 3–12)
Neutro Abs: 5.4 10*3/uL (ref 1.7–7.7)
Neutrophils Relative %: 76 % (ref 43–77)

## 2010-12-14 LAB — TSH: TSH: 1.83 u[IU]/mL (ref 0.350–4.500)

## 2010-12-14 LAB — CK TOTAL AND CKMB (NOT AT ARMC)
CK, MB: 1 ng/mL (ref 0.3–4.0)
Relative Index: INVALID (ref 0.0–2.5)
Relative Index: INVALID (ref 0.0–2.5)
Total CK: 70 U/L (ref 7–177)
Total CK: 82 U/L (ref 7–177)

## 2010-12-14 LAB — HEMOGLOBIN A1C: Hgb A1c MFr Bld: 8.2 % — ABNORMAL HIGH (ref <5.7)

## 2010-12-14 LAB — POCT CARDIAC MARKERS: Troponin i, poc: <0.05 ng/mL (ref 0.00–0.09)

## 2010-12-14 LAB — TROPONIN I
Troponin I: 0.01 ng/mL (ref 0.00–0.06)
Troponin I: 0.01 ng/mL (ref 0.00–0.06)
Troponin I: 0.02 ng/mL (ref 0.00–0.06)

## 2010-12-29 LAB — POCT CARDIAC MARKERS
CKMB, poc: 1.5 ng/mL (ref 1.0–8.0)
CKMB, poc: 2 ng/mL (ref 1.0–8.0)
Myoglobin, poc: 105 ng/mL (ref 12–200)
Myoglobin, poc: >500 ng/mL (ref 12–200)
Troponin i, poc: 0.09 ng/mL (ref 0.00–0.09)
Troponin i, poc: 0.09 ng/mL (ref 0.00–0.09)
Troponin i, poc: 0.13 ng/mL — ABNORMAL HIGH (ref 0.00–0.09)
Troponin i, poc: 0.15 ng/mL — ABNORMAL HIGH (ref 0.00–0.09)

## 2010-12-29 LAB — DIFFERENTIAL
Basophils Absolute: 0.1 10*3/uL (ref 0.0–0.1)
Eosinophils Relative: 1 % (ref 0–5)
Lymphocytes Relative: 18 % (ref 12–46)
Lymphs Abs: 1.3 10*3/uL (ref 0.7–4.0)
Monocytes Absolute: 0.3 10*3/uL (ref 0.1–1.0)
Monocytes Relative: 4 % (ref 3–12)
Neutro Abs: 5.9 10*3/uL (ref 1.7–7.7)

## 2010-12-29 LAB — CK TOTAL AND CKMB (NOT AT ARMC)
CK, MB: 1.2 ng/mL (ref 0.3–4.0)
Relative Index: INVALID (ref 0.0–2.5)
Total CK: 73 U/L (ref 7–177)

## 2010-12-29 LAB — BASIC METABOLIC PANEL
Calcium: 9.5 mg/dL (ref 8.4–10.5)
GFR calc Af Amer: >60 mL/min (ref >60)
GFR calc non Af Amer: >60 mL/min (ref >60)
Glucose, Bld: 220 mg/dL — ABNORMAL HIGH (ref 70–99)
Potassium: 4.2 mEq/L (ref 3.5–5.1)
Sodium: 134 mEq/L — ABNORMAL LOW (ref 135–145)

## 2010-12-29 LAB — POCT I-STAT, CHEM 8
BUN: 7 mg/dL (ref 6–23)
Calcium, Ion: 1.06 mmol/L — ABNORMAL LOW (ref 1.12–1.32)
Chloride: 101 mEq/L (ref 96–112)
Creatinine, Ser: 0.7 mg/dL (ref 0.4–1.2)
Potassium: 3.9 mEq/L (ref 3.5–5.1)
Sodium: 138 mEq/L (ref 135–145)
TCO2: 28 mmol/L (ref 0–100)

## 2010-12-29 LAB — D-DIMER, QUANTITATIVE: D-Dimer, Quant: 1.48 ug/mL-FEU — ABNORMAL HIGH (ref 0.00–0.48)

## 2010-12-29 LAB — CBC
HCT: 41.9 % (ref 36.0–46.0)
Hemoglobin: 14.4 g/dL (ref 12.0–15.0)
RBC: 4.91 MIL/uL (ref 3.87–5.11)
RDW: 12.7 % (ref 11.5–15.5)

## 2010-12-29 LAB — TROPONIN I: Troponin I: <0.01 ng/mL (ref 0.00–0.06)

## 2011-01-05 LAB — BLOOD GAS, ARTERIAL
Acid-Base Excess: 2.6 mmol/L — ABNORMAL HIGH (ref 0.0–2.0)
FIO2: 0.21 %
O2 Saturation: 98.8 %
pO2, Arterial: 104 mmHg — ABNORMAL HIGH (ref 80.0–100.0)

## 2011-01-05 LAB — URINALYSIS, ROUTINE W REFLEX MICROSCOPIC
Bilirubin Urine: NEGATIVE
Glucose, UA: NEGATIVE mg/dL
Hgb urine dipstick: NEGATIVE
Ketones, ur: NEGATIVE mg/dL
Nitrite: NEGATIVE
Specific Gravity, Urine: 1.014 (ref 1.005–1.030)
pH: 7 (ref 5.0–8.0)

## 2011-01-05 LAB — TYPE AND SCREEN: Antibody Screen: NEGATIVE

## 2011-01-05 LAB — GLUCOSE, CAPILLARY
Glucose-Capillary: 108 mg/dL — ABNORMAL HIGH (ref 70–99)
Glucose-Capillary: 129 mg/dL — ABNORMAL HIGH (ref 70–99)
Glucose-Capillary: 134 mg/dL — ABNORMAL HIGH (ref 70–99)
Glucose-Capillary: 136 mg/dL — ABNORMAL HIGH (ref 70–99)
Glucose-Capillary: 137 mg/dL — ABNORMAL HIGH (ref 70–99)
Glucose-Capillary: 140 mg/dL — ABNORMAL HIGH (ref 70–99)
Glucose-Capillary: 147 mg/dL — ABNORMAL HIGH (ref 70–99)
Glucose-Capillary: 147 mg/dL — ABNORMAL HIGH (ref 70–99)
Glucose-Capillary: 150 mg/dL — ABNORMAL HIGH (ref 70–99)
Glucose-Capillary: 153 mg/dL — ABNORMAL HIGH (ref 70–99)
Glucose-Capillary: 160 mg/dL — ABNORMAL HIGH (ref 70–99)
Glucose-Capillary: 164 mg/dL — ABNORMAL HIGH (ref 70–99)
Glucose-Capillary: 170 mg/dL — ABNORMAL HIGH (ref 70–99)
Glucose-Capillary: 173 mg/dL — ABNORMAL HIGH (ref 70–99)
Glucose-Capillary: 188 mg/dL — ABNORMAL HIGH (ref 70–99)
Glucose-Capillary: 188 mg/dL — ABNORMAL HIGH (ref 70–99)
Glucose-Capillary: 218 mg/dL — ABNORMAL HIGH (ref 70–99)
Glucose-Capillary: 312 mg/dL — ABNORMAL HIGH (ref 70–99)

## 2011-01-05 LAB — COMPREHENSIVE METABOLIC PANEL
ALT: 14 U/L (ref 0–35)
AST: 18 U/L (ref 0–37)
AST: 27 U/L (ref 0–37)
Albumin: 2.9 g/dL — ABNORMAL LOW (ref 3.5–5.2)
Alkaline Phosphatase: 112 U/L (ref 39–117)
CO2: 26 mEq/L (ref 19–32)
Calcium: 8.3 mg/dL — ABNORMAL LOW (ref 8.4–10.5)
Chloride: 101 mEq/L (ref 96–112)
Creatinine, Ser: 0.76 mg/dL (ref 0.4–1.2)
Creatinine, Ser: 0.69 mg/dL (ref 0.4–1.2)
GFR calc Af Amer: >60 mL/min (ref >60)
GFR calc Af Amer: >60 mL/min (ref >60)
GFR calc non Af Amer: >60 mL/min (ref >60)
Potassium: 4 mEq/L (ref 3.5–5.1)
Sodium: 134 mEq/L — ABNORMAL LOW (ref 135–145)
Total Bilirubin: 0.5 mg/dL (ref 0.3–1.2)
Total Protein: 6.3 g/dL (ref 6.0–8.3)

## 2011-01-05 LAB — BASIC METABOLIC PANEL
BUN: 18 mg/dL (ref 6–23)
CO2: 27 mEq/L (ref 19–32)
Calcium: 8.1 mg/dL — ABNORMAL LOW (ref 8.4–10.5)
Calcium: 8.6 mg/dL (ref 8.4–10.5)
Calcium: 8.6 mg/dL (ref 8.4–10.5)
Chloride: 100 mEq/L (ref 96–112)
Creatinine, Ser: 1 mg/dL (ref 0.4–1.2)
Creatinine, Ser: 1.08 mg/dL (ref 0.4–1.2)
GFR calc Af Amer: >60 mL/min (ref >60)
GFR calc Af Amer: >60 mL/min (ref >60)
GFR calc non Af Amer: 57 mL/min — ABNORMAL LOW (ref >60)
GFR calc non Af Amer: >60 mL/min (ref >60)
GFR calc non Af Amer: >60 mL/min (ref >60)
Glucose, Bld: 135 mg/dL — ABNORMAL HIGH (ref 70–99)
Glucose, Bld: 160 mg/dL — ABNORMAL HIGH (ref 70–99)
Potassium: 3.6 mEq/L (ref 3.5–5.1)
Potassium: 3.8 mEq/L (ref 3.5–5.1)
Potassium: 4 mEq/L (ref 3.5–5.1)
Sodium: 137 mEq/L (ref 135–145)

## 2011-01-05 LAB — CARDIAC PANEL(CRET KIN+CKTOT+MB+TROPI)
CK, MB: 2.8 ng/mL (ref 0.3–4.0)
Relative Index: 0.6 (ref 0.0–2.5)
Total CK: 500 U/L — ABNORMAL HIGH (ref 7–177)

## 2011-01-05 LAB — CBC
HCT: 28.5 % — ABNORMAL LOW (ref 36.0–46.0)
HCT: 29.4 % — ABNORMAL LOW (ref 36.0–46.0)
HCT: 38.5 % (ref 36.0–46.0)
Hemoglobin: 10.2 g/dL — ABNORMAL LOW (ref 12.0–15.0)
Hemoglobin: 10.6 g/dL — ABNORMAL LOW (ref 12.0–15.0)
MCHC: 34.3 g/dL (ref 30.0–36.0)
MCHC: 34.9 g/dL (ref 30.0–36.0)
MCV: 86.3 fL (ref 78.0–100.0)
MCV: 86.3 fL (ref 78.0–100.0)
MCV: 86.3 fL (ref 78.0–100.0)
Platelets: 179 10*3/uL (ref 150–400)
Platelets: 179 10*3/uL (ref 150–400)
RBC: 3.3 MIL/uL — ABNORMAL LOW (ref 3.87–5.11)
RBC: 3.41 MIL/uL — ABNORMAL LOW (ref 3.87–5.11)
RBC: 3.45 MIL/uL — ABNORMAL LOW (ref 3.87–5.11)
RBC: 4.46 MIL/uL (ref 3.87–5.11)
RDW: 13.2 % (ref 11.5–15.5)
RDW: 13.5 % (ref 11.5–15.5)
WBC: 9 10*3/uL (ref 4.0–10.5)
WBC: 9.1 10*3/uL (ref 4.0–10.5)
WBC: 7.1 10*3/uL (ref 4.0–10.5)

## 2011-01-05 LAB — POCT I-STAT 3, ART BLOOD GAS (G3+)
O2 Saturation: 99 %
Patient temperature: 98.3
TCO2: 26 mmol/L (ref 0–100)

## 2011-01-05 LAB — PHOSPHORUS: Phosphorus: 3.9 mg/dL (ref 2.3–4.6)

## 2011-01-05 LAB — APTT: aPTT: 32 seconds (ref 24–37)

## 2011-01-06 LAB — GLUCOSE, CAPILLARY: Glucose-Capillary: 157 mg/dL — ABNORMAL HIGH (ref 70–99)

## 2011-01-06 LAB — PROTIME-INR
INR: 1 (ref 0.00–1.49)
Prothrombin Time: 13 seconds (ref 11.6–15.2)

## 2011-01-06 LAB — CBC
Hemoglobin: 14.1 g/dL (ref 12.0–15.0)
MCHC: 34.4 g/dL (ref 30.0–36.0)
MCV: 86.4 fL (ref 78.0–100.0)
RBC: 4.76 MIL/uL (ref 3.87–5.11)
WBC: 7.7 10*3/uL (ref 4.0–10.5)

## 2011-01-07 LAB — GLUCOSE, CAPILLARY: Glucose-Capillary: 173 mg/dL — ABNORMAL HIGH (ref 70–99)

## 2011-01-11 ENCOUNTER — Ambulatory Visit (HOSPITAL_COMMUNITY)
Admission: RE | Admit: 2011-01-11 | Discharge: 2011-01-11 | Disposition: A | Discharge status: Home / Self Care | Payer: Medicare Other | Source: Ambulatory Visit | Attending: Gynecology | Admitting: Gynecology

## 2011-01-11 DIAGNOSIS — Z1231 Encounter for screening mammogram for malignant neoplasm of breast: Secondary | ICD-10-CM | POA: Insufficient documentation

## 2011-01-12 LAB — GLUCOSE, CAPILLARY
Glucose-Capillary: 109 mg/dL — ABNORMAL HIGH (ref 70–99)
Glucose-Capillary: 117 mg/dL — ABNORMAL HIGH (ref 70–99)
Glucose-Capillary: 133 mg/dL — ABNORMAL HIGH (ref 70–99)
Glucose-Capillary: 148 mg/dL — ABNORMAL HIGH (ref 70–99)
Glucose-Capillary: 168 mg/dL — ABNORMAL HIGH (ref 70–99)
Glucose-Capillary: 245 mg/dL — ABNORMAL HIGH (ref 70–99)
Glucose-Capillary: 266 mg/dL — ABNORMAL HIGH (ref 70–99)

## 2011-01-12 LAB — DIFFERENTIAL
Basophils Absolute: 0 10*3/uL (ref 0.0–0.1)
Basophils Relative: 1 % (ref 0–1)
Eosinophils Relative: 2 % (ref 0–5)
Lymphocytes Relative: 24 % (ref 12–46)
Lymphs Abs: 2.2 10*3/uL (ref 0.7–4.0)
Monocytes Relative: 6 % (ref 3–12)
Monocytes Relative: 6 % (ref 3–12)
Neutro Abs: 6.3 10*3/uL (ref 1.7–7.7)
Neutro Abs: 6.8 10*3/uL (ref 1.7–7.7)
Neutrophils Relative %: 69 % (ref 43–77)

## 2011-01-12 LAB — BASIC METABOLIC PANEL
BUN: 9 mg/dL (ref 6–23)
Calcium: 9.3 mg/dL (ref 8.4–10.5)
Calcium: 9.4 mg/dL (ref 8.4–10.5)
Chloride: 104 mEq/L (ref 96–112)
Creatinine, Ser: 0.75 mg/dL (ref 0.4–1.2)
GFR calc Af Amer: >60 mL/min (ref >60)
GFR calc Af Amer: >60 mL/min (ref >60)
GFR calc non Af Amer: >60 mL/min (ref >60)
Glucose, Bld: 108 mg/dL — ABNORMAL HIGH (ref 70–99)
Potassium: 3.6 mEq/L (ref 3.5–5.1)
Potassium: 3.7 mEq/L (ref 3.5–5.1)
Sodium: 137 mEq/L (ref 135–145)
Sodium: 140 mEq/L (ref 135–145)

## 2011-01-12 LAB — HEMOGLOBIN A1C
Hgb A1c MFr Bld: 7.5 % — ABNORMAL HIGH (ref 4.6–6.1)
Mean Plasma Glucose: 169 mg/dL

## 2011-01-12 LAB — POCT CARDIAC MARKERS
CKMB, poc: 1.2 ng/mL (ref 1.0–8.0)
CKMB, poc: 1.4 ng/mL (ref 1.0–8.0)
Myoglobin, poc: 144 ng/mL (ref 12–200)
Myoglobin, poc: 91.2 ng/mL (ref 12–200)

## 2011-01-12 LAB — CARDIAC PANEL(CRET KIN+CKTOT+MB+TROPI)
CK, MB: 1 ng/mL (ref 0.3–4.0)
Relative Index: 1 (ref 0.0–2.5)
Relative Index: INVALID (ref 0.0–2.5)
Total CK: 101 U/L (ref 7–177)
Total CK: 90 U/L (ref 7–177)
Troponin I: <0.01 ng/mL (ref 0.00–0.06)

## 2011-01-12 LAB — CBC
HCT: 33.2 % — ABNORMAL LOW (ref 36.0–46.0)
Hemoglobin: 11.5 g/dL — ABNORMAL LOW (ref 12.0–15.0)
Hemoglobin: 12.8 g/dL (ref 12.0–15.0)
Hemoglobin: 13.8 g/dL (ref 12.0–15.0)
MCHC: 35.2 g/dL (ref 30.0–36.0)
MCV: 85 fL (ref 78.0–100.0)
MCV: 86.9 fL (ref 78.0–100.0)
RBC: 3.82 MIL/uL — ABNORMAL LOW (ref 3.87–5.11)
RBC: 4.3 MIL/uL (ref 3.87–5.11)
RBC: 4.62 MIL/uL (ref 3.87–5.11)
WBC: 8.1 10*3/uL (ref 4.0–10.5)
WBC: 9.2 10*3/uL (ref 4.0–10.5)

## 2011-01-12 LAB — PROTIME-INR: Prothrombin Time: 13.5 seconds (ref 11.6–15.2)

## 2011-01-12 LAB — TSH: TSH: 1.864 u[IU]/mL (ref 0.350–4.500)

## 2011-01-12 LAB — CK TOTAL AND CKMB (NOT AT ARMC): Relative Index: INVALID (ref 0.0–2.5)

## 2011-01-23 ENCOUNTER — Emergency Department (HOSPITAL_COMMUNITY)
Admission: EM | Admit: 2011-01-23 | Discharge: 2011-01-24 | Disposition: A | Discharge status: Home / Self Care | Payer: Medicare Other | Attending: Emergency Medicine | Admitting: Emergency Medicine

## 2011-01-23 ENCOUNTER — Emergency Department (HOSPITAL_COMMUNITY): Payer: Medicare Other

## 2011-01-23 DIAGNOSIS — R1013 Epigastric pain: Secondary | ICD-10-CM | POA: Insufficient documentation

## 2011-01-23 DIAGNOSIS — Z85118 Personal history of other malignant neoplasm of bronchus and lung: Secondary | ICD-10-CM | POA: Insufficient documentation

## 2011-01-23 DIAGNOSIS — E119 Type 2 diabetes mellitus without complications: Secondary | ICD-10-CM | POA: Insufficient documentation

## 2011-01-23 DIAGNOSIS — M129 Arthropathy, unspecified: Secondary | ICD-10-CM | POA: Insufficient documentation

## 2011-01-23 DIAGNOSIS — E7800 Pure hypercholesterolemia, unspecified: Secondary | ICD-10-CM | POA: Insufficient documentation

## 2011-01-23 DIAGNOSIS — I10 Essential (primary) hypertension: Secondary | ICD-10-CM | POA: Insufficient documentation

## 2011-01-23 DIAGNOSIS — R110 Nausea: Secondary | ICD-10-CM | POA: Insufficient documentation

## 2011-01-23 DIAGNOSIS — K219 Gastro-esophageal reflux disease without esophagitis: Secondary | ICD-10-CM | POA: Insufficient documentation

## 2011-01-23 DIAGNOSIS — Z794 Long term (current) use of insulin: Secondary | ICD-10-CM | POA: Insufficient documentation

## 2011-01-23 LAB — COMPREHENSIVE METABOLIC PANEL
ALT: 18 U/L (ref 0–35)
Calcium: 10 mg/dL (ref 8.4–10.5)
Creatinine, Ser: 0.91 mg/dL (ref 0.4–1.2)
Glucose, Bld: 263 mg/dL — ABNORMAL HIGH (ref 70–99)
Sodium: 137 mEq/L (ref 135–145)
Total Protein: 8.3 g/dL (ref 6.0–8.3)

## 2011-01-23 LAB — CBC
HCT: 39.3 % (ref 36.0–46.0)
MCH: 29.2 pg (ref 26.0–34.0)
MCHC: 35.1 g/dL (ref 30.0–36.0)
RDW: 12.5 % (ref 11.5–15.5)

## 2011-01-23 LAB — POCT CARDIAC MARKERS
CKMB, poc: <1 ng/mL — ABNORMAL LOW (ref 1.0–8.0)
Myoglobin, poc: 108 ng/mL (ref 12–200)
Troponin i, poc: <0.05 ng/mL (ref 0.00–0.09)

## 2011-01-23 LAB — DIFFERENTIAL
Basophils Absolute: 0.1 10*3/uL (ref 0.0–0.1)
Basophils Relative: 1 % (ref 0–1)
Eosinophils Relative: 1 % (ref 0–5)
Monocytes Absolute: 0.5 10*3/uL (ref 0.1–1.0)

## 2011-01-24 ENCOUNTER — Emergency Department (HOSPITAL_COMMUNITY): Payer: Medicare Other

## 2011-01-24 LAB — LIPASE, BLOOD: Lipase: 25 U/L (ref 11–59)

## 2011-01-26 ENCOUNTER — Other Ambulatory Visit (HOSPITAL_COMMUNITY): Payer: Self-pay | Admitting: Gastroenterology

## 2011-01-26 DIAGNOSIS — R110 Nausea: Secondary | ICD-10-CM

## 2011-01-26 DIAGNOSIS — K219 Gastro-esophageal reflux disease without esophagitis: Secondary | ICD-10-CM

## 2011-02-09 ENCOUNTER — Encounter (HOSPITAL_COMMUNITY)
Admission: RE | Admit: 2011-02-09 | Discharge: 2011-02-09 | Disposition: A | Discharge status: Home / Self Care | Payer: Medicare Other | Source: Ambulatory Visit | Attending: Gastroenterology | Admitting: Gastroenterology

## 2011-02-09 DIAGNOSIS — K449 Diaphragmatic hernia without obstruction or gangrene: Secondary | ICD-10-CM | POA: Insufficient documentation

## 2011-02-09 DIAGNOSIS — R110 Nausea: Secondary | ICD-10-CM | POA: Insufficient documentation

## 2011-02-09 DIAGNOSIS — K3189 Other diseases of stomach and duodenum: Secondary | ICD-10-CM | POA: Insufficient documentation

## 2011-02-09 DIAGNOSIS — K219 Gastro-esophageal reflux disease without esophagitis: Secondary | ICD-10-CM | POA: Insufficient documentation

## 2011-02-09 DIAGNOSIS — E119 Type 2 diabetes mellitus without complications: Secondary | ICD-10-CM | POA: Insufficient documentation

## 2011-02-09 MED ORDER — TECHNETIUM TC 99M SULFUR COLLOID
2.0000 | Freq: Once | INTRAVENOUS | Status: AC | PRN
  Administered 2011-02-09: 2 via INTRAVENOUS

## 2011-02-09 NOTE — Op Note (Signed)
[name], [name]            ACCOUNT NO.:  [id number]   MEDICAL RECORD NO.:  [id number]          PATIENT TYPE:  INP   LOCATION:  2302                         FACILITY:  [facility]   PHYSICIAN:  Ines Bloomer, M.D. DATE OF BIRTH:  1950/12/12   DATE OF PROCEDURE:  02/11/2009  DATE OF DISCHARGE:                               OPERATIVE REPORT   PREOPERATIVE DIAGNOSIS:  Left upper lobe non-small cell lung cancer.   POSTOPERATIVE DIAGNOSIS:  Left upper lobe non-small cell lung cancer.   OPERATION PERFORMED:  Left VAT, left thoracotomy, left upper lobectomy  with node dissection.   SURGEON:  Ines Bloomer, MD   FIRST ASSISTANT:  Stephanie Acre Dasovich, [facility]   ANESTHESIA:  General.   PROCEDURE IN DETAIL:  After percutaneous insertion of all monitoring  lines, the patient was turned to the left lateral thoracotomy position.  The left lung was deflated.  A Dual-lumen tube was inserted.  A trocar  site was made in the seventh intercostal space at the anterior axillary  line and a trocar was inserted and the lesion could be seen without  difficulty in the posterior segment of the left upper lobe.  The patient  also had multiple, other small lesions on the lung.  We were worried  that this could possibly be cancer.  A posterolateral thoracotomy was  made and dissection was carried down dividing the latissimus and  partially dividing the serratus.  The fifth interspace was entered and a  portion of the sixth rib was taken posteriorly at the [facility]  was  inserted.  The patient was morbidly obese.  We inquired to the deep  place to insert the Jasper.  Her chest cavity however was very  small.  We first dissected the inferior pulmonary ligament and with  electrocautery, taken a 9 L node and then resected.  We did a wedge  resection of the left lower lobeof whitish nodules that were on the  lung.  Frozen sections of these revealed inflammatory tissue.  We then  proceeded with a left  upper lobectomy, dissected down the superior  pulmonary vein and stapling it with 2 applications of the Autosuture, 2-  mm stapler stapling this.  The lingular branches in one staple and the  apical posterior branches in the other staple.  We then started in the  fissure dissecting out the 2 branches of the lingular artery, ligated,  and approximated with 0 silk, clipping them, and dividing them.  We  dissected out some 11 L nodes.  The nodes were very stuck to the  pulmonary artery and required a very meticulous dissection to get the  nodes off  the pulmonary artery.  We dissected up to an anterior  segment, anterior branch, and again dissected an 11 L node off of it and  divided with 0 silk clip and divided it.   Next, we dissected out a 10 L node and I was able to get around the  apical posterior branch, which was very large and we then stapled and  divided that with the Autosuture 2-mm Roticulator stapler.  We checked  all nodes of the bronchus, both the 10 L nodes and then stapled it with  the Autosuture green stapler, which is 4.5 mm.  Bronchial margins were  negative, the tumors and adenocarcinoma.  A 5 node were dissected free  and then a CoSeal was just applied to the staple line.  The 2 chest  tubes were brought in.  One to the trocar site and one through a  separate stab  wound.  Marcaine block was done in the usual fashion.  Chest was closed  in layers with #1 Vicryl and the muscle layer with 2-0 Vicryl, in the  subcutaneous tissues and Dermabond for the skin.  The patient was  returned to the recovery room in stable condition.      Ines Bloomer, M.D.  Electronically Signed     DPB/MEDQ  D:  02/11/2009  T:  02/12/2009  Job:  [id number]

## 2011-02-09 NOTE — Letter (Signed)
September 30, 2009   Lajuana Matte, MD  [phone] [address]  [place], [place] [address]   Re:  SARAANN, ENNEKING                  DOB:  [birth year]   Dear Dr. Arbutus Ped:   The patient came today, now 6 months since her surgery which was a left  upper lobectomy for a stage I cancer.  She is doing well overall and her  recent CT scan showed no evidence of recurrence for cancer.  She will be  followed by you.  Her blood pressure was 150/80, pulse 76, respirations  18, and sats were 95%.  I will see her back again if she has any future  problem.   Sincerely,   Ines Bloomer, M.D.  Electronically Signed   DPB/MEDQ  D:  09/30/2009  T:  10/01/2009  Job:  [id number]

## 2011-02-09 NOTE — Letter (Signed)
March 27, 2009   Lajuana Matte, MD  [phone] [address]  [place], [place] [address]   Re:  [name], MERGEN                  DOB:  1951-06-02   Dear Dr. Arbutus Ped:   The patient came today on 03/26/2009.  Her blood pressure was 168/81,  pulse 83, respirations 18, and sats were 97%.  Her lungs were clear to  auscultation and percussion.  Her incisions were well healed.  Chest x-  ray was stable, showed normal postoperative change.  She has seen Dr.  Arbutus Ped, who is going to send her tumor off for EGFR.  She was a stage  IA and is doing well overall.  I plan to see her back again in 6 weeks  and I will discuss her case with Dr. Arbutus Ped.   Sincerely,   Ines Bloomer, M.D.  Electronically Signed   DPB/MEDQ  D:  03/27/2009  T:  03/27/2009  Job:  [id number]

## 2011-02-09 NOTE — Letter (Signed)
February 26, 2009   Barbaraann Share, MD,FCCP  [address]  [place], [place] [address]   Re:  PORSCHIA, WILLBANKS                  DOB:  03-Mar-1951   Dear Mellody Dance,   I saw the patient back today and removed her chest tube sutures.  She is  doing reasonably well after a left upper lobectomy for a stage IA non-  small cell lung cancer.  She was a T1b, N0, M0.  Her blood pressure was  132/70, pulse 86, respirations 18, and saturations were 97%.  Chest x-  ray showed normal postoperative changes.  I told her to gradually  increase her activities.  We will see her back again in 1 week to check  on the status of her wound.  Overall, she is making a reasonable  progress considering her morbid obesity and preoperative risk factors.   Ines Bloomer, M.D.  Electronically Signed   DPB/MEDQ  D:  02/26/2009  T:  02/27/2009  Job:  [id number]   cc:   Dineen Kid. Reche Dixon, M.D.

## 2011-02-09 NOTE — H&P (Signed)
[name], [name]            ACCOUNT NO.:  [id number]   MEDICAL RECORD NO.:  [id number]          PATIENT TYPE:  INP   LOCATION:  NA                           FACILITY:  [facility]   PHYSICIAN:  Ines Bloomer, M.D. DATE OF BIRTH:  26-Apr-1951   DATE OF ADMISSION:  DATE OF DISCHARGE:                              HISTORY & PHYSICAL   CHIEF COMPLAINT:  Lung mass.   HISTORY OF PRESENT ILLNESS:  A 60 year old female was found have left  upper lung mass that was felt to be infectious, PPD was negative, PET  scan was negative.  She had a lung biopsy, which revealed adenocarcinoma  of the bronchoalveolar features.  She quit smoking many years ago and  has had no hemoptysis fever, chills, excessive sputum, but does have  morbid obesity.   PAST MEDICAL HISTORY:  Significant for,  1. Diabetes mellitus, type 2.  2. Hypertension.  3. Dyslipidemia.   CURRENT MEDICATIONS:  1. She is on Novolin 70/30.  2. Lisinopril/hydrochlorothiazide 20/25 daily.  3. Crestor 5 mg daily.  4. Aspirin.   She has no allergies.   Family history is positive for emphysema and cardiac disease.   SOCIAL HISTORY:  She quit smoking in 1987.  She smoked half pack a day,  does not drink alcohol on a regular basis.   REVIEW OF SYSTEMS:  Her weight is 272 pounds, 5 feet 3 inches.  Her  weight has been stable.  CARDIAC:  No angina, atrial fibrillation.  PULMONARY:  See history of present illness.  No hemoptysis.  GI:  She  has reflux.  GU:  No kidney disease, dysuria, or frequent urination.  Vas:  No claudication, DVT.  NEUROLOGIC:  No dizziness,  blackouts,  seizures.  MUSCULOSKELETAL:  No arthritis or joint pains.  PSYCHIATRIC:  No depression, no nervousness.  ENT:  No change in her eyesight or  hearing.  HEMATOLOGICAL:  No problems with bleeding, clotting disorders,  or anemia.   PHYSICAL EXAMINATION:  GENERAL:  She is an obese Caucasian female in no  acute distress.  VITAL SIGNS:  Her blood pressure is  179/78, pulse 77, respirations 18,  sats were 98%.  HEENT:  Head is atraumatic.  Eyes, pupils equal and reactive to light  and accommodation.  Extraocular movements normal.  Ears, tympanic  membranes were intact.  Nose, no septal deviation.  Throat, uvula is in  midline.  There are no lesions.  NECK:  Supple without thyromegaly.  There is no subclavicular or  axillary adenopathy.  CHEST:  Clear to auscultation and percussion.  HEART:  Regular sinus rhythm.  No murmurs.  ABDOMEN:  Soft.  No hepatosplenomegaly.  EXTREMITIES:  Pulses are 2+, no clubbing or edema.  NEUROLOGIC:  She is oriented x3.  Sensory and motor intact.  Cranial  nerves intact.   Pulmonary function test showed an FVC of 1.80 with an FEV-1 of 1.47, 7%  predicted and DLCO was 70 and corrected.  She has no obstructive  disease.   IMPRESSION:  1. Left upper lobe non-small cell lung cancer.  2. Morbid obesity.  3. Dyslipidemia.  4.  Diabetes mellitus, type 2.  5. Hypertension.   PLAN:  Left VATS, left thoracotomy, resection of left upper lobe or  segmentectomy.      Ines Bloomer, M.D.  Electronically Signed     DPB/MEDQ  D:  02/08/2009  T:  02/09/2009  Job:  [id number]

## 2011-02-09 NOTE — H&P (Signed)
[name], [name]            ACCOUNT NO.:  [id number]   MEDICAL RECORD NO.:  [id number]          PATIENT TYPE:  INP   LOCATION:  2030                         FACILITY:  [facility]   PHYSICIAN:  Della Goo, M.D. DATE OF BIRTH:  1951-08-16   DATE OF ADMISSION:  11/02/2008  DATE OF DISCHARGE:                              HISTORY & PHYSICAL   PRIMARY CARE PHYSICIAN:  [facility].   CHIEF COMPLAINT:  Chest pain, chest tightness.   HISTORY OF PRESENT ILLNESS:  This is a 60 year old female who presents  to the emergency department with complaints of chest pain described as  chest tightness  which she states has been occurring for the past 2  months but has worsened over the past day.  She states that the episodes  have been coming and going and last about 20 minutes at a time.  She  states that at the worst the pain is rated at a 5/10.  She also states  that she has associated symptoms of lightheadedness.  The discomfort is  located in the center of her chest.  She denies having any nausea,  vomiting, diaphoresis.   PAST MEDICAL HISTORY:  Significant for type 2 diabetes mellitus,  hypertension, hyperlipidemia.   PAST SURGICAL HISTORY:  History of a hysterectomy, cholecystectomy, left  total knee replacement, and 3 surgeries on her feet for correction of  hammertoes, 2 surgeries on the right foot and 1 surgery on the left  foot.   CURRENT MEDICATIONS:  Medications include Novolin 70/30 fifteen units  subcu q.a.m. and 20 units subcu q.p.m., lisinopril/hydrochlorothiazide  20/25 1 p.o. daily and aspirin 81 mg 1 p.o. daily   ALLERGIES:  No known drug allergies.   SOCIAL HISTORY:  The patient is a nonsmoker, nondrinker.   FAMILY HISTORY:  Positive for coronary artery disease, hypertension and  diabetes in her brother.  Her mother and sister also have hypertension,  and there is no history of cancer in her family that she knows of.   REVIEW OF SYSTEMS:  Pertinent as mentioned  above.   PHYSICAL EXAMINATION FINDINGS:  GENERAL APPEARANCE:  This is a 58-year-  old obese female in no discomfort or acute distress currently. VITAL  SIGNS:  Initially temperature 98.1, blood pressure 196/80, heart rate  91, respirations 18, O2 saturation 100%.  HEENT: Normocephalic, atraumatic.  Pupils are equally round reactive to  light.  Extraocular movements are intact. Funduscopic benign.  Nares are  patent bilaterally.  Oropharynx is clear.  Neck is supple with full  range of motion.  No thyromegaly, adenopathy, or jugular venous  distention.  CARDIOVASCULAR:  Regular rate and rhythm.  No murmurs, gallops or rubs.  LUNGS: Clear to auscultation bilaterally.  ABDOMEN: Positive bowel sounds, soft, nontender, nondistended.  EXTREMITIES: Without cyanosis, clubbing or edema.  NEUROLOGIC:  Alert and oriented x3.  There are no focal deficits.   LABORATORY STUDIES:  White blood cell count 9.1, hemoglobin 13.8,  hematocrit 39.3 and platelets 221.  Sodium of 137, potassium 3.7,  chloride 99, bicarb 31, BUN 9, creatinine 0.7, and glucose 108. Point of  care cardiac markers with a  myoglobin of 91.2, CK-MB 1.4, and troponin  less than 0.05. Myoglobin point of care markers set number2 myoglobin  144, CK-MB 1.2, and troponin less than 0.05.  Chest x-ray reveals a  density of the left upper lobe which has progressed. A CT scan of the  chest has been performed.  CT scan of the chest reveals a 2x2.3 cm  density which has indistinct margins, worrisome for a possible neoplasm.  No enlarged hilar or mediastinal lymph nodes are seen.   ASSESSMENT:  A 60 year old female being admitted with  1. Chest pain.  2. Lung mass.  3. Type 2 diabetes mellitus.  4. Hypertension/hypertensive urgency.   PLAN:  The patient will be admitted to telemetry area for monitoring.  Cardiac enzymes will be performed.  The patient will be placed on  nitrates, oxygen, aspirin therapy along with DVT and GI prophylaxis.   Sliding scale insulin coverage has also been ordered.  Further workup  will begin in regard to the lung mass that has been seen.      Della Goo, M.D.  Electronically Signed     HJ/MEDQ  D:  11/02/2008  T:  11/03/2008  Job:  [id number]

## 2011-02-09 NOTE — Discharge Summary (Signed)
[name], KRASZEWSKI NO.:  [id number]   MEDICAL RECORD NO.:  [id number]          PATIENT TYPE:  INP   LOCATION:  3306                         FACILITY:  [facility]   PHYSICIAN:  Ines Bloomer, M.D. DATE OF BIRTH:  02-Jan-1951   DATE OF ADMISSION:  02/11/2009  DATE OF DISCHARGE:  02/17/2009                               DISCHARGE SUMMARY   FINAL DIAGNOSES:  1. Left upper lobe non-small cell lung cancer.  2. Adenocarcinoma T1b N0 Mx.   SECONDARY DIAGNOSES:  1. Diabetes mellitus type 2.  2. Hypertension.  3. Dyslipidemia.   IN-HOSPITAL OPERATIONS AND PROCEDURES:  Left video-assisted  thoracoscopic surgery with left thoracotomy, left upper lobectomy with  lymph node dissection.   HISTORY AND PHYSICAL AND HOSPITAL COURSE:  The patient is a 60 year old  female who was found to have a left upper lung mass, it was felt to be  an infectious.  PPD was negative and PET scan was negative.  The patient  had a lung biopsy, which revealed adenocarcinoma with bronchioalveolar  features.  The patient quit smoking many years ago and has had no  hemoptysis, fever, chills, excessive sputum.  She does have diabetes  with hypertension, dyslipidemia.  The patient was seen and evaluated by  Dr. Edwyna Shell.  Dr. Edwyna Shell discussed with the patient undergoing left upper  lobectomy.  He discussed the risks and benefits with the patient.  The  patient acknowledged understanding and agreed to proceed.  Surgery was  scheduled for Feb 11, 2009.  For further details of patient's past  medical history and physical exam, please see dictated H and P.   The patient was taken to the operating room on Feb 11, 2009, where she  underwent the left video-assisted thoracoscopic surgery with left  thoracotomy, left upper lobectomy, and lymph node dissection.  The  patient's pathology report came back showing  adenocarcinoma T1b N0 Mx.  The patient tolerated this procedure was transferred to the Intensive  Care Unit in stable condition.  Postoperatively, the patient was able to  be extubated.  Postextubation, she was noted to be alert and oriented x4  and neuro intact.  She was hemodynamically stable postoperatively.  Postoperatively, daily chest x-rays obtained.  The patient's chest x-  rays were stable.  She had no air leak postoperatively.  Posterior chest  tube was discontinued on postop day 2 with remaining chest tube  discontinued on postop day 3.  Repeat chest x-ray was stable with no  pneumothorax noted.  During this time, the patient was using her  incentive spirometer.  She was able to weaned off oxygen, sating greater  than 90% on room air.  During the patient's postoperative course daily  vital signs were obtained.  The patient was noted to be afebrile.  Blood  pressure was stable postoperatively.  She was in normal sinus rhythm.  The patient did have low urine output, postoperatively on Foley, stayed  in for several extra days to monitor urine out, this improved.  Foley  was discontinued and the patient did not have any difficulty voiding  postremoval of Foley.  Postoperatively, the  patient was up ambulating  with assistance.  She was progressing slowly.  Case Management was  consulted for discharge planning.  Skilled nurse as well as physical  therapy were arranged prior to discharge home.  The patient was  tolerating diet well.  No nausea, vomiting noted.  All incisions were  clean, dry, intact, and healing well.   On postop day #6 Feb 17, 2009, the patient's vitals noted to be stable.  She is sating greater than 90% on room air.  She is diabetic and had  been restarted on her Novolin 70/30.  Her blood sugars were followed  postoperatively and remained stable.  Most recent blood work shows  sodium of 137, potassium 4.0, chloride of 102, bicarbonate 28, BUN of 7,  creatinine 0.64, glucose 160.  White blood cells 9.0, hemoglobin 10.2,  hematocrit 29.4, platelet count 209.  The  patient was felt to be stable  and ready for discharge home today, postop day #6 Feb 17, 2009.   FOLLOWUP APPOINTMENTS:  Followup appointment will be arranged with Dr.  Edwyna Shell in 1 week.  Our office will contact the patient with this  information.  The patient will need to obtain PA and lateral chest x-ray  45 minutes prior to this appointment.   ACTIVITY:  The patient was instructed no driving, she agrees to do so,  no heavy lifting over 10 pounds.  She was told to ambulate 3-4 times per  day, progress as tolerated and continue her breathing exercises.   INCISIONAL CARE:  The patient was told to shower, wash her incisions  using soap and water.  She is to contact the office if she develops any  drainage or openings from any of her incision sites.   DIET:  The patient is to begin on diet to be low-fat, low-salt as well  as diabetic diet.   DISCHARGE MEDICATIONS:  1. Novolin 70/30, 15 units in the a.m. 20 units in the p.m.  2. Lisinopril/hydrochlorothiazide 20/25 mg daily.  3. Aspirin 81 mg daily.  4. Crestor 5 mg at night.  5. Percocet 5/325 1-2 tablets q.4-6 h. p.r.n. pain.      Brandi Blazing, PA      Ines Bloomer, M.D.  Electronically Signed    KMD/MEDQ  D:  02/17/2009  T:  02/18/2009  Job:  [id number]   cc:   Ines Bloomer, M.D.

## 2011-02-09 NOTE — Discharge Summary (Signed)
NAMECERENITY, GOSHORN            ACCOUNT NO.:  [id number]   MEDICAL RECORD NO.:  [id number]          PATIENT TYPE:  INP   LOCATION:  2030                         FACILITY:  [facility]   PHYSICIAN:  Beckey Rutter, MD  DATE OF BIRTH:  09-11-1951   DATE OF ADMISSION:  11/02/2008  DATE OF DISCHARGE:  11/05/2008                               DISCHARGE SUMMARY   PRIMARY CARE PHYSICIAN:  Camillo Flaming. Reche Dixon, MD   CARDIOLOGIST:  Madaline Savage, MD   CHIEF COMPLAINT:  Chest pain and chest tightness.   BRIEF HISTORY OF PRESENT ILLNESS:  A 60 year old female presented with  above complaint.   HOSPITAL COURSE:  During hospital course, the patient was evaluated for  coronary artery disease.  The patient underwent coronary angiogram on  November 04, 2008.  The impression on the procedure is:  1. Selective coronary angiogram by Judkins technique.  2. Retrograde left heart catheterization  3. Left ventricular angiography.  The impression was:  1. Angiographically patent right coronary artery, left main coronary      artery, and circumflex coronary arteries.  2. Left anterior descending coronary artery with 30% area of narrowing      just beyond the diagonal branch.  3. Normal left ventricular systolic function.   The recommendation after the cath is:  1. Reassurance.  2. Follow up with Dr. Elsie Lincoln in 2-3 months.   Left upper lung nodule that was noticed in the chest x-ray.  The chest x-  ray was followed by CT scan, which showing also a nodule there.  Possibility of malignancy was discussed with the patient and in fact,  biopsy by Interventional Radiology was contemplated, but the radiologist  needed PET scan for further evaluation prior to that.  I consulted  Pulmonary for recommendation in regard of the nodule and the plan  currently is to follow up with [facility] in 3-4 weeks.  PPD was implanted yesterday.  Possibility of PET scan as an outpatient  is  considered.   HOSPITAL PROCEDURES:  Chest x-ray done on November 02, 2008, the  impression was density of left upper lung appears progressive.  CT is  recommended for further evaluation.   On August 02, 2009, the patient has CT chest with contrast, the  impression was the left upper lobe density.  Chest radiography  corresponds to the indistinct 2 x 2.3-cm pulmonary density that his  worrisome for a neoplastic disease as it has developed in an area where  smaller nodule was seen in 2008.  Possibility exists that this could  represent a small focal infiltrate, but the location would be  coincidental with previous seen density.   DISCHARGE DIAGNOSES:  1. Chest pain found to have normal coronaries.  2. Lung mass, worrisome for malignancy.  3. Diabetes type 2.  4. Hypertension.  5. Obesity.   DISCHARGE MEDICATIONS:  1. Novolin 70/30 15 units q.a.m., hold if glucose less than 100.  2. Novolin 70/30 20 units subcutaneously in the p.m., hold if glucose      is less than 100.  3. Lisinopril/hydrochlorothiazide 20/25 mg p.o. daily, hold  if blood      pressure less than 100.  4. Aspirin 81 mg daily.   DISCHARGE PLAN:  The patient will be discharged today to follow up with  [facility] in 3-4 weeks to address the left upper lung nodule as  discussed with her.  The patient is aware of the possibility of  malignancy.   The patient had PPD implanted yesterday and the question now is how is  she going to read this PPD.  We are trying to obtain an appointment for  [facility] tomorrow to read the PPD or the other option will be for the  patient to come back to the floor for the PPD to be read.  The work in  regards to the reading PPD is still in progress and will finally  documented in the discharge instruction and conveyed to the patient.  The patient is aware and agreeable to the plan and also is stressed.  She is aware of the possibility of lung malignancy in the left upper   lung.      Beckey Rutter, MD  Electronically Signed     EME/MEDQ  D:  11/05/2008  T:  11/06/2008  Job:  [id number]   cc:   Dineen Kid. Reche Dixon, M.D.

## 2011-02-09 NOTE — Cardiovascular Report (Signed)
NAMEKHALIS, [name] NO.:  [id number]   MEDICAL RECORD NO.:  [id number]          PATIENT TYPE:  INP   LOCATION:  2030                         FACILITY:  [facility]   PHYSICIAN:  Madaline Savage, M.D.DATE OF BIRTH:  1951-05-26   DATE OF PROCEDURE:  11/04/2008  DATE OF DISCHARGE:                            CARDIAC CATHETERIZATION   PROCEDURES PERFORMED:  1. Selective coronary angiography by Judkins technique.  2. Retrograde left heart catheterization.  3. Left ventricular angiography.   COMPLICATIONS:  None.   ENTRY SITE:  Right femoral.   DYE USED:  Omnipaque.   PATIENT PROFILE:  [name] is an obese African American female who  presented to the emergency room with complaints of chest pain that  clinically sounded like they could possibly be anginal that was not  associated with a bump in cardiac enzymes.  She does have risk factors,  however, that includes type 2 diabetes mellitus, hypertension,  hyperlipidemia, previous total knee replacement, cholecystectomy and  hysterectomy.  The patient's entry blood pressure was 190/80.  She was  also found to have a lung mass.  Today's cardiac catheterization was  performed electively to evaluate her coronary arterial status given her  risk factors.   RESULTS:  Pressures:  The left ventricular pressure was 154/1, end-  diastolic pressure 7.  The central aortic pressure was 140/74.  These  were done non-simultaneously, however, the arterial pressures that were  done on a simultaneous basis showed only a 5-mm gradient cross the  aortic valve by pullback technique.   ANGIOGRAPHIC RESULTS:  The patient's left main coronary artery was  rather small with no lesions noted.   The left anterior descending coronary artery courses to the cardiac apex  giving rise to a bifurcating septal perforator branch and a trifurcation  of the LAD at the apex.  The proximal diagonal branch which was long and  about 2-mm in diameter  showed no evidence of any significant disease.  There was noted to be about a 30% area of narrowing in the mid-LAD.   Right coronary artery was a large and dominant vessel with a 4 or 5  normal-appearing branches throughout the course of this vessel including  a sinus node branch, a pulmonary conus branch, a large right ventricular  branch and a large posterior descending branch and a smaller bifurcating  posterolateral branch.  No lesions were seen.   Left ventricular angiography showed vigorous contractility of all wall  segments with ejection fraction estimated at 60%.  Only catheter induced  mitral regurgitation seen.  No spontaneous MR noted.   Angio-Seal closure was performed on the patient due to her obesity and  to lessen the chances of her having a groin complication.   FINAL IMPRESSIONS:  1. Angiographically patent right coronary artery, left main coronary      artery and circumflex coronary artery.  2. Left anterior descending coronary artery with a 30% area of      narrowing just beyond the diagonal branch.  3. Normal left ventricular systolic function.   PLAN:  The patient will be reassured about her coronary anatomy and  an  Angio-Seal closure device was placed which will allow her to ambulate  earlier this evening if she is able to that would improve the back pain  as she complained about during her cardiac cath..           ______________________________  Madaline Savage, M.D.     WHG/MEDQ  D:  11/04/2008  T:  11/05/2008  Job:  [id number]   cc:   Della Goo, M.D.

## 2011-02-09 NOTE — Letter (Signed)
March 05, 2009   Barbaraann Share, MD,FCCP  [address]  [place], [place] [address]   Re:  VIRA, CHAPLIN                  DOB:  July 18, 1951   Dear Mellody Dance,   I saw the patient back today.  Her one area in her incision that we were  worried about is now healing well.  It has been mentioned she is a stage  IA.  I will refer her to Dr. Si Gaul for observation and follow  up.  Her blood pressure was 136/76, pulse 97, respirations 18, and  saturations were 96%.  Lungs were clear to auscultation and percussion.  Chest x-ray was stable.  We will see her back in 3 weeks with a chest x-  ray.   Ines Bloomer, M.D.  Electronically Signed   DPB/MEDQ  D:  03/05/2009  T:  03/06/2009  Job:  [id number]   cc:   Dineen Kid. Reche Dixon, M.D.  Lajuana Matte, MD

## 2011-02-09 NOTE — Letter (Signed)
January 22, 2009   Barbaraann Share, MD,FCCP  [address]  [place], [place] [address]   Re:  [name], Brandi A                  DOB:  1951-07-22   Dear Mellody Dance:   I appreciate the opportunity of seeing the patient.  This is [name] 60-year-  old African American female who was found to have [name] left upper lobe mass  that was first thought to be possible infectious, but her PPD was  negative.  Although, her PET scan was negative, she underwent [name] lung  biopsy, which revealed adenocarcinoma with bronchioalveolar features.  She is referred here for resection.  She quit smoking many years ago and  her main problem is morbid obesity.  She has had no hemoptysis, fevers,  chills, or excessive sputum.   PAST MEDICAL HISTORY:  She has diabetes.   MEDICATIONS:  1. Novolin 70/30.  2. She also has hypertension and takes lisinopril/hydrochlorothiazide      20-25 daily.  3. Crestor 5 mg [name] day for dyslipidemia.  4. Aspirin.   ALLERGIES:  She has no allergies.   FAMILY HISTORY:  She has had family histories positive for emphysema and  cardiac disease.   SOCIAL HISTORY:  She quit smoking in 1987.  She smoked half [name] pack [name]  day.  She does not drink alcohol on [name] regular basis.   REVIEW OF SYSTEMS:  VITAL SIGNS:  Her weight is 272 pounds.  She is 5  feet 3 inches.  GENERAL:  Her weight has been stable.  CARDIAC:  No  angina or atrial fibrillation.  PULMONARY:  See history of present  illness.  No hemoptysis.  GI:  She has reflux.  GU:  No kidney disease,  dysuria, or frequent urination.  VASCULAR:  No claudication, DVT, or  TIAs.  NEUROLOGICAL:  No dizziness, headaches, blackouts, or seizures.  MUSCULOSKELETAL:  Arthritis or joint pain.  PSYCHIATRIC:  No depression  or nervous.  Eye/ENT:  No changes in her eyesight or hearing.  HEMATOLOGIC:  No problems with bleeding, clotting disorders, or anemia.   PHYSICAL EXAMINATION:  GENERAL:  She is an obese Caucasian female, not  in acute distress.  She comes  in today with her family.  VITAL SIGNS:  Her blood pressure is 179/78, pulse 77, respirations 18,  and sats were 98%.  HEAD, EYES, EARS, NOSE, AND THROAT:  Unremarkable.  NECK:  Supple without thyromegaly.  There is no supraclavicular or  axillary adenopathy.  CHEST:  Clear to auscultation and percussion.  HEART:  Regular sinus rhythm.  No murmurs.  ABDOMEN:  Soft.  There is no hepatosplenomegaly.  EXTREMITY:  Pulses are 2+.  There is no clubbing or edema.  NEUROLOGIC:  She is oriented x3.  Sensory and motor intact.   I think, unfortunately, we did not get her pulmonary function test, now  we will obtain them from [facility].  If that is satisfactory,  I will schedule her for [name] VATS left thoracotomy and left upper lobectomy  at [facility].  I have explained the risks to her and she and her  family agreed.   I appreciate the opportunity of seeing the patient.   Sincerely,   Ines Bloomer, M.D.  Electronically Signed   DPB/MEDQ  D:  01/22/2009  T:  01/23/2009  Job:  [id number]

## 2011-02-09 NOTE — Assessment & Plan Note (Signed)
OFFICE VISIT   SHEREESE, BONNIE A  DOB:  1951/01/02                                        May 28, 2009  CHART #:  [id number]   She comes back today.  Her incisions are well healed.  Her blood  pressure was 140/80, pulse 77, and sats were 98%.  Her chest x-ray  showed normal postoperative changes on left.  She will be seeing Dr.  Arbutus Ped in December and I will see her back in 4 months with a chest x-  ray.   Ines Bloomer, M.D.  Electronically Signed   DPB/MEDQ  D:  05/28/2009  T:  05/28/2009  Job:  [id number]

## 2011-02-12 NOTE — Op Note (Signed)
[name], [name]                      ACCOUNT NO.:  [id number]   MEDICAL RECORD NO.:  [id number]                   PATIENT TYPE:  INP   LOCATION:  2887                                 FACILITY:  [facility]   PHYSICIAN:  Burnard Bunting, M.D.                 DATE OF BIRTH:  [birth year]   DATE OF PROCEDURE:  05/28/2003  DATE OF DISCHARGE:                                 OPERATIVE REPORT   PREOPERATIVE DIAGNOSIS:  Left knee arthritis.   POSTOPERATIVE DIAGNOSIS:  Left knee arthritis.   PROCEDURE:  Left total knee arthroplasty.   SURGEON:  Burnard Bunting, M.D.   ASSISTANT:  Jerolyn Shin. Tresa Res, M.D.   ANESTHESIA:  General endotracheal.   ESTIMATED BLOOD LOSS:  75 mL.   DRAINS:  Hemovac x1.   TOURNIQUET TIME:  2 hours at 327 mmHg.   COMPONENTS:  Cemented posterior stabilized size 7 femur, size 7 tibia, size  3 dome patella, and 10 mm flex insert.   DESCRIPTION OF PROCEDURE:  The patient was brought to the operating room  where general endotracheal anesthesia was induced.  Preoperative IV  antibiotics were administered.  Epidural anesthesia had been induced in  preoperative holding.  The left leg including the foot was prepped with  Duraprep solution and draped in a sterile manner.  [name] was used to cover  the operative field. The leg was elevated and exsanguinated with the Esmarch  wrap.  The tourniquet was inflated.  Anterior approach to the knee was  utilized.  The skin and subcutaneous tissue was sharply divided.  Median  parapatellar approach was made to the knee.  Precise location of the  arthrotomy was marked with a #1 Vicryl suture at the superior medial aspect  of the patella.  The lateral patellofemoral ligament was released.  Soft  tissue was then released from the proximal aspect of the distal anterior  femur in order to visualize the anterolateral femoral flare.  Periosteal  sleeve of tissue was released medially back to the semimembranosus bursa.  The distal  femoral cut was then performed at 12 mm.  The adjustable external  rotation alignment guide was then placed and the femur was cut in  approximately 5 degrees of external rotation parallel to the epicondylar  axis of the femur.  At this time, the anterior, posterior, and chamfer cuts  were made.  The femur was noted to be size 5.  At this time, the tibia was  then prepared.  Intramedullary alignment was utilized.  10 mm resection of  the least effected lateral tibial plateau was made.  The collateral  ligaments were protected during the resection.  Posterior retractor was also  placed for protection during the tibial plateau resection.  Following tibial  plateau resection, the box cut was made on the femur.  Trial components were  then placed.  Size 7 tibia, 5 femur with 10 mm spacer.  Good  collateral  ligament stability was noted.  Patella tracked well with no thumbs  technique.  Range of motion was full with no liftoff past 90 degrees of  flexion.  At this time, the patella was prepared.  It measured 22 mm with  calipers.  A 10 mm resection was performed using free-hand technique and the  oscillating saw.  Flatness of the cut was checked with the edge of a flat  block.  A size 3 patella was noted to be surrounded by bone on all sides.  Holes were then drilled for the size 3 patella.  At this time, the trial  patella was placed and the range of motion and stability remained excellent.  Patella tracking was excellent.  The components were removed at this time.  Tibial keel punch was performed.  Pulsatile irrigation was then performed on  all cut bony surfaces.  Cement components were then cemented into position  beginning with the keel tibia which was cut at a 5 degree posterior slope.  The femur was then placed and excess cement was removed.  The patella was  then placed.  Cement was allowed to harden and excess cement was removed.  At this time, the true 10 mm spacer was placed and again  the patient had  about 1 to 2 degrees of hyperextension, full flexion, and excellent  collateral stability with good patella tracking.  The tourniquet was  released.  Bleeding points were controlled using electrocautery.  Incision  was again irrigated.  Hemovac drain was placed.  Parapatellar arthrotomy was  closed with the knee in about 40 degrees of flexion, by first  reapproximating the Vicryl suture placed initially and then using 0 Vicryl  suture to reapproximate the arthrotomy.  The skin and subcutaneous tissue  was closed using interrupted 0 Vicryl, interrupted and inverted 2-0 Vicryl,  and skin staples.  Hemovac drain was placed prior to closure of the  arthrotomy.  The knee was injected with 30 mL of 0.5% Marcaine without  epinephrine.  1 mL of Clonidine and 8 mg of morphine.  The patient was  wrapped in a bulky wrap and a knee immobilizer. She tolerated the procedure  well without immediate complications.                                               Burnard Bunting, M.D.    GSD/MEDQ  D:  05/28/2003  T:  05/28/2003  Job:  [id number]

## 2011-02-12 NOTE — Op Note (Signed)
NAMEMASEY, SCHEIBER            ACCOUNT NO.:  [id number]   MEDICAL RECORD NO.:  [id number]          PATIENT TYPE:  INP   LOCATION:  2550                         FACILITY:  [facility]   PHYSICIAN:  Burnard Bunting, M.D.    DATE OF BIRTH:  1951/09/26   DATE OF PROCEDURE:  10/06/2004  DATE OF DISCHARGE:                                 OPERATIVE REPORT   PREOPERATIVE DIAGNOSIS:  Left knee arthrofibrosis.   POSTOPERATIVE DIAGNOSIS:  Left knee arthrofibrosis.   PROCEDURE:  Left knee open synovectomy with tibial revision and liner  exchange.   SURGEON:  Burnard Bunting, M.D.   ASSISTANT:  Jerolyn Shin. Tresa Res, M.D.   ANESTHESIA:  General endotracheal.   ESTIMATED BLOOD LOSS:  75 cc.   DRAINS:  Hemovac x1.   CULTURES:  Cultures obtained x2.   TOURNIQUET TIME:  74 minutes at 325 mmHg.   Preoperative range of motion 20 to 60. Postoperative range of motion 0 to  105 with gravity, 0 to 115 with manipulation.   PROCEDURE IN DETAIL:  The patient was brought to the operating room where  general endotracheal anesthesia was induced, and preoperative antibiotics  were administered. The left leg was prepped with Duraprep solution to the  foot and draped in a sterile manner. __________ operative field. Leg was  elevated and exsanguinated with Esmarch. Tourniquet was inflated. Prior  anterior incision was utilized. The skin and subcutaneous tissue were  sharply divided. Median parapatellar arthrotomy was made. The suprapatellar  pouch was completely obliterated. Medial and lateral gutters were  established. Total synovectomy was subsequently performed. The cultures were  obtained of fluid and of tissue from intercondylar notch region.  Intraoperative gram stain was negative with no polys in the field. At this  time, the liner was removed. Posterior dissection was performed with Crego  elevator. Care was taken to avoid injury to the articulating surface of the  total knee replacement. All in all, the  liner had minimal wear. The  components were assessed for stability, and they were stable. The knee was  then irrigated with 3 liters of irrigating solution. Following the soft  tissue release, the knee was trialed with a 10-degree flexed liner and found  to come out to full extension and 115 degrees of flexion with good stability  of varus and valgus stressing 0 and 30 degrees. At this time, the tourniquet  was released. Bleeding points encountered controlled using electrocautery.  Hemovac drain was placed.  Incision was closed. The median parapatellar arthrotomy was closed over a  bolster using #1 Vicryl suture. The skin was closed using interrupted 2-0  Vicryl suture and skin staples. Bulky dressing was placed. The patient  tolerated the procedure well without any complications.       GSD/MEDQ  D:  10/06/2004  T:  10/06/2004  Job:  [id number]

## 2011-02-12 NOTE — Op Note (Signed)
NAMELYSETTE, LINDENBAUM            ACCOUNT NO.:  [id number]   MEDICAL RECORD NO.:  [id number]          PATIENT TYPE:  AMB   LOCATION:  ENDO                         FACILITY:  [facility]   PHYSICIAN:  Jordan Hawks. Elnoria Howard, MD    DATE OF BIRTH:  12-29-1950   DATE OF PROCEDURE:  09/04/2004  DATE OF DISCHARGE:                                 OPERATIVE REPORT   PROCEDURE:  Esophagogastroduodenoscopy.   ENDOSCOPISTS:  Jordan Hawks. Elnoria Howard, M.D. and Anselmo Rod, M.D.   INDICATION:  Dysphagia and abnormal esophagogram revealing a cervical  stricture.   EQUIPMENT USED:  Adult Olympus endoscope.   PHYSICAL EXAMINATION:  CARDIAC:  Regular rate and rhythm.  LUNGS:  Clear to auscultation bilaterally.  ABDOMEN:  Soft, nontender, nondistended.   MEDICATIONS:  Versed 7 mg IV and Demerol 60 mg IV.   DESCRIPTION OF PROCEDURE:  After adequate sedation was achieved, the  endoscope was advanced from the oral cavity to the duodenum under direct  visualization without difficulty.  Upon slow withdrawal of the endoscope,  photo documentation of the duodenum and the distal stomach was obtained  which was normal appearing.  Retroflexion revealed a hiatal hernia;  however, there is no evidence of extrinsic masses or abnormalities in the  cardia region. The endoscope was then straightened and then withdrawn into  the esophagus, and the Z line is noted to be normal, and there is no  evidence of any esophagitis.  The endoscope was then slowly withdrawn and at  approximately 15 cm from the incisors, the cervical stenosis was noted.  It  was benign appearing, and there was heme.  The endoscope was passed  initially without any difficulty, and no resistance was encountered.  There  is no evidence of extrinsic compression.  At this time, it was felt that the  patient will be amenable to dilatation with a Savary dilator.  The endoscope  was then advanced into the stomach again, and the scope was exchanged over a  guide  wire leaving the guide wire in the stomach.  A 10-mm  Savary dilator  was then inserted.  There is no resistance of heme noted.  This was advanced  to 11 mm and then subsequently to 12 mm.  At 12 mm, the Savary dilator did  pass with ease;  however, there was note of some minor heme.  A final  dilatation with 12.8 mm was performed, and again the Savary dilator passed  with ease, and no resistance was encountered, and upon withdrawal of both  the dilator and the wire, there was some more evidence of heme on the  dilator.  No further dilatations were performed, and the procedure was  terminated.  The patient tolerated the procedure well.  Post-procedure  palpation of the neck region was negative for any crepitus.   PLAN:  1.  Continue with Nexium.  2.  Return to clinic in two weeks.  3.  Depending on the patient's clinical status, a repeat dilatation may be      performed.  4.  The patient is to maintain a soft diet for the next  24 hours.       PDH/MEDQ  D:  09/04/2004  T:  09/05/2004  Job:  [id number]

## 2011-02-12 NOTE — Op Note (Signed)
[place]  Patient:    [name], [name] Visit Number: [id number] MRN: [id number]          Service Type: END Location: ENDO Attending Physician:  Charna Elizabeth Dictated by:   Anselmo Rod, M.D. Proc. Date: 04/02/02 Admit Date:  04/02/2002 Discharge Date: 04/02/2002   CC:         Velna Hatchet, M.D.   Operative Report  DATE OF BIRTH:  [birth year]  PROCEDURES PERFORMED:  Screening colonoscopy.  ENDOSCOPIST:  Anselmo Rod, M.D.  INSTRUMENTS USED:  Pediatric adjustable Olympus colonoscope.  INDICATIONS FOR PROCEDURE:  The patient is a 60 year old African-American female undergoing screening colonoscopy to rule out colonic polyps, masses, hemorrhoids, etc.  PREPROCEDURE PREPARATION: Informed consent was procured from the patient and the patient was fasted for eight hours prior to the procedure and prepped with a bottle of magnesium citrate and a gallon of NuLytely the night prior to the procedure.  PREPROCEDURE PHYSICAL: The patient had stable vital signs. Neck was supple. Chest was clear to auscultation. S1, S2 regular. Abdomen soft with normal bowel sounds.  DESCRIPTION OF THE PROCEDURE: The patient was placed in the left lateral decubitus position and sedated with 100 mg of Demerol and 10 mg of Versed intravenously. Once the patient was adequately sedated and maintained on low flow oxygen and continuous cardiac monitoring, the Olympus video colonoscope was advanced from the rectum to the cecum without difficulty. There was some residual stool in the cecum.  Multiple washings were done. The appendiceal orifice and the ileocecal valve were clearly visualized and photographed. No masses, polyps, erosions, ulcerations or diverticula were seen. A small internal hemorrhoid was seen on retroflexion into the rectum. The patient tolerated the procedure well without complications.  IMPRESSION:  Normal colonoscopy except for  small non bleeding internal hemorrhoid seen on retroflexion.  RECOMMENDATIONS: 1. Repeat colorectal cancer screening is recommended in the next 10 years    unless the patient develops any abnormal symptoms in the interim. 2. A high fiber diet has been advised along with liberal fluid intake. 3. Outpatient follow up on a p.r.n. basis. Dictated by:   Anselmo Rod, M.D. Attending Physician:  Charna Elizabeth DD:  04/02/02 TD:  04/04/02 Job: [id number]

## 2011-02-12 NOTE — Op Note (Signed)
[name], [name] NO.:  [id number]   MEDICAL RECORD NO.:  [id number]          PATIENT TYPE:  INP   LOCATION:  5036                         FACILITY:  [facility]   PHYSICIAN:  Burnard Bunting, M.D.    DATE OF BIRTH:  05-31-1951   DATE OF PROCEDURE:  02/01/2006  DATE OF DISCHARGE:  02/08/2006                                 OPERATIVE REPORT   PREOPERATIVE DIAGNOSIS:  Left total knee arthrofibrosis and possible loose  tibial component.   POSTOPERATIVE DIAGNOSIS:  Left total knee arthrofibrosis and possible loose  tibial component.   PROCEDURE:  Left total knee arthroplasty revision, with arthrofibrosis  excision.   SURGEON:  Burnard Bunting, M.D.   ASSISTANT:  Jerolyn Shin. Tresa Res, M.D.   ANESTHESIA:  General endotracheal.   ESTIMATED BLOOD LOSS:  300 mL.   DRAINS:  Hemovac times one.   TOURNIQUET TIME:  Two hours at 300 mmHg, followed by tourniquet release for  approximately 30-45 minutes with placing it back up for a second round for  about 15 minutes at 300 mmHg.   SPECIMENS:  Culture specimens sent times two, as well as soft tissue sent  for immediate Gram stain analysis which showed no acute inflammation.   INDICATIONS:  [name] is a 60 year old patient with painful total  knee arthroplasty, loosening suspected based on presence of pain and  possible loosening on bone scan.   PROCEDURE IN DETAIL:  The patient was brought to the operating room, where  general endotracheal anesthesia was induced preoperatively.  Preoperative  antibiotics were administered.  The left knee was prepped prior to the  procedure and draped in a sterile manner.  Operative field was covered with  Ioban.  The leg was elevated and exsanguinated with Esmarch.  The tourniquet  was inflated.  An anterior approach to the knee was then utilized.  The skin  and subcu tissues were sharply divided.  Median parapatellar approach was  made.  The patient had abundant and completely  filling scar tissue in the  medial and lateral gutters, anterior notch, and suprapatellar pouch.  All  this arthrofibrosis was excised.  The patella was not everted but deflected  laterally.  The tibial component was then fully evaluated and found to be  loose with deep bonding between the cement and the tibial base plate  interface.  The tibial component was removed.  Cement was removed.  Specimen  was sent at this time for Gram stain and acute inflammation analysis which  was negative.  Femoral component was then removed using thin oscillating  saws and curved osteotomes.  There was minimal bone loss with removal of the  component.  The patella was well fixed and did not require revision.  At  this time, the cup-binding surfaces were irrigated.  Freshening cuts were  made on the tibia and subsequently on the femur.  Stem tibial tray was then  constructed and placed in a trial fashion.  In a similar manner, stemmed  femur with augments was placed with 10-mm spacer.  The augments on the femur  were subsequently adjusted in order to  raise the joint line.  This allowed  the patient to achieve full extension-flexion to about 100 degrees with no  lift-off and good collateral ligament stability at 0, 30, and 90 degrees of  flexion.  At this time, trial components were removed.  Components were then  put together on the back table.  No augments were necessary on the tibia.  One augment was required on the femur.  At this time, the tourniquet was  released.  Bleeding points were encountered controlled using  electrocautery.  Thorough irrigation of the knee joint was performed.  Tourniquet was then placed back up after being down for about 30 minutes.  Components were then cemented into position with good stability achieved.  Excess cement was removed.  The same range of motion parameters with good  patellar tracking were maintained.  At this time, the tourniquet was again  released.  Bleeding  points were controlled using electrocautery.  The skin  was thoroughly irrigated.  The incision was then closed over a bolster using  #1 Vicryl suture to reapproximate the median parapatellar arthrotomy,  followed by interrupted inverted 0 Vicryl, 2-0 Vicryl, and skin staples.  The patient tolerated the procedure well without immediate complication.  She was transferred to the recovery room in stable condition with palpable  pulses and good dorsiflexion and plantar flexion of the foot.  It should be  noted that Dr. Lenny Pastel assistance was required in this tedious and  difficult case for protection of neurovascular structures and exposure.           ______________________________  G. Dorene Grebe, M.D.     GSD/MEDQ  D:  02/21/2006  T:  02/21/2006  Job:  [id number]

## 2011-02-12 NOTE — Discharge Summary (Signed)
NAME:  [name], [name]                      ACCOUNT NO.:  [id number]   MEDICAL RECORD NO.:  [id number]                   PATIENT TYPE:  INP   LOCATION:  5028                                 FACILITY:  [facility]   PHYSICIAN:  Burnard Bunting, M.D.                 DATE OF BIRTH:  15-Apr-1951   DATE OF ADMISSION:  12/31/2003  DATE OF DISCHARGE:  01/02/2004                                 DISCHARGE SUMMARY   DISCHARGE DIAGNOSIS:  Left knee arthrofibrosis.   OPERATIONS AND NOTABLE PROCEDURES:  Left total knee manipulation under  anesthesia with arthroscopic debridement performed on December 31, 2003.   HOSPITAL COURSE:  [name] is a 60 year old patient with left knee  arthrofibrosis.  She underwent left knee manipulation under anesthesia and  arthroscopic debridement, December 31, 2003.  She tolerated the procedure well.  The patient required IV pain medicine for 2 days postop.  Her CPM was set on  90 degrees and was increased 5 degrees today while she was in the hospital.  Physical therapy was also started.  The patient had an otherwise uneventful  recovery.  Motor and sensory function to the foot were intact after the  procedure.  There were no fractures.  The patient was discharged home in  good condition.   FOLLOWUP:  She will follow up with me in a week for suture removal.   DISCHARGE MEDICATIONS:  Discharge medications include preadmission  medications of:  1. Percocet 1-2 q.3-4 h. p.r.n. pain.  2. Robaxin 500 mg p.o. q.6 h. p.r.n. spasm.   DISPOSITION:  Discharged home in good condition.                                                Burnard Bunting, M.D.    GSD/MEDQ  D:  02/06/2004  T:  02/07/2004  Job:  [id number]

## 2011-02-12 NOTE — Procedures (Signed)
[place]  Patient:    [name], [name] Visit Number: [id number] MRN: [id number]          Service Type: END Location: ENDO Attending Physician:  Charna Elizabeth Dictated by:   Anselmo Rod, M.D. Proc. Date: 04/06/02 Admit Date:  04/06/2002 Discharge Date: 04/06/2002   CC:         Dorothyann Peng, M.D., Internal Medicine   Procedure Report  ESOPHAGOGASTRODUODENOSCOPY REPORT  DATE OF BIRTH:  1951/04/17  PROCEDURE PERFORMED:  Esophagogastroduodenoscopy.  ENDOSCOPIST:  Nicanor Alcon, M.D.  INSTRUMENT USED:  Olympus video panendoscope.  INDICATIONS FOR PROCEDURE:  A 60 year old African-American female with history of acute dysphagia.  The patient had a piece of meat and could not swallow it down.  The patient was seen in the emergency room at [facility] yesterday and she could swallow liquids.  She was asked to come to the hospital today for further evaluation of her dysphagia.  The food bolus seemed to have passed when the patient presented to the hospital.  PREPROCEDURAL PREPARATION:  Informed consent was procured from the patient. The patient fasted for eight hours prior to the procedure.  PREPROCEDURAL PHYSICAL:  VITAL SIGNS:  Stable.  CHEST:  Clear to auscultation, S1 and S2 regular.  ABDOMEN:  Soft with normal bowel sounds.  DESCRIPTION OF PROCEDURE:  The patient was placed in the left lateral decubitus position and sedated with 100 mg of Demerol and 7 mg of Versed intravenously.  Once the patient was adequately sedated and maintained on low flow oxygen and continuous cardiac monitoring. the Olympus video panendoscope was advanced with the mouthpiece over the tongue into the esophagus under direct vision.  The esophagus was widely patent with no esophagitis, stricture, or Barretts mucosa.  On advancing the scope into the stomach, there was evidence of antral gastritis with erosion.  CLO test was done  with antral biopsies.  The proximal small bowel appeared normal.  A small hiatal hernia was seen on retroflexion.  IMPRESSION: 1. Widely patent esophagus. 2. Antral gastritis with erosions.  CLO test done, results pending. 3. Small hiatal hernia on retroflexion. 4. Normal proximal small bowel.  RECOMMENDATIONS: 1. Prevacid 30 mg, 1 p.o. q.d. has been prescribed for the patient, #30 with    3 refills. 2. Avoid very hot or very cold liquids. 3. Possible Barium swallow if esophagus symptoms persist. Dictated by:   Anselmo Rod, M.D. Attending Physician:  Charna Elizabeth DD:  04/07/02 TD:  04/10/02 Job: [id number]

## 2011-02-12 NOTE — Op Note (Signed)
   NAME:  [name], [name]                      ACCOUNT NO.:  [id number]   MEDICAL RECORD NO.:  [id number]                   PATIENT TYPE:  INP   LOCATION:  2887                                 FACILITY:  [facility]   PHYSICIAN:  Judie Petit, M.D.              DATE OF BIRTH:  [birth year]   DATE OF PROCEDURE:  05/28/2003  DATE OF DISCHARGE:                                 OPERATIVE REPORT   INDICATIONS FOR PROCEDURE:  This is a 60 year old patient of Dr. Diamantina Providence, who  presents today for total knee arthroplasty.  Dr. August Saucer and the patient have  requested epidural placement for postoperative pain relief.  The procedure  was discussed in detail with the patient preoperatively.  Risks were  discussed in detail with the patient particularly that of bleeding, nerve  damage, paralysis, infection, spinal, etc. Questions were answered and  consent given.  The procedure was performed in the preoperative holding area  prior to surgery.   DESCRIPTION OF PROCEDURE:  The patient was placed in the sitting position.  The L3-4 interspace was aseptically prepped and draped with Betadine.  A 17  gauge Tuohy needle was used with loss of resistance technique with  preservative-free normal saline. There was no heme or CSF aspiration.  Test  dose was negative with 3 mL of 1.5% Xylocaine with epinephrine 1:200,000.  The catheter was placed 3 cm without difficulty.  The needle was withdrawn  without difficulty.  The catheter was affixed to the patient's back with  Hypafix.  Initial unsuccessful placement at L4-5 area after local 1%  Xylocaine 5 mL total.  The patient was subsequently taken to the operating  room.  The patient after surgery will be placed on a Marcaine and fentanyl  infusion and will be followed by the anesthesia team.                                               Judie Petit, M.D.    CE/MEDQ  D:  05/28/2003  T:  05/28/2003  Job:  [id number]

## 2011-02-12 NOTE — Op Note (Signed)
   [name], [name]                      ACCOUNT NO.:  [id number]   MEDICAL RECORD NO.:  [id number]                   PATIENT TYPE:  AMB   LOCATION:  NESC                                 [facility]ACILITY:  [facility]   PHYSICIAN:  Lindaann Slough, M.D.               DATE OF BIRTH:  03-14-1951   DATE OF PROCEDURE:  05/13/2003  DATE OF DISCHARGE:                                 OPERATIVE REPORT   PREOPERATIVE DIAGNOSES:  Recurrent urinary tract infections, meatal stenosis  and neurogenic bladder.   POSTOPERATIVE DIAGNOSES:  Recurrent urinary tract infections, meatal  stenosis and neurogenic bladder.   PROCEDURES:  1. Cystoscopy.  2. Urethral dilation.   SURGEON:  Lindaann Slough, M.D.   ANESTHESIA:  General.   INDICATIONS:  The patient is a 60 year old female who has been complaining  of frequency and urgency, and urgency incontinence.  She was treated with  antibiotics without any improvement.  She is scheduled today for cystoscopy.   DESCRIPTION OF PROCEDURE:  Under general anesthesia, the patient was prepped  and draped and placed in the dorsal lithotomy position.  A #22 Wappler  cystoscope was inserted in the bladder.  The bladder mucosa is normal.  There is no stone or tumor in the bladder.  The ureteral orifices are in  normal position and shape, with clear efflux.  There is no evidence of  submucosal hemorrhage.  The cystoscope was then removed.  The urethra was  dilated with 32-French.   The patient tolerated the procedure well.  She left the OR in satisfactory  condition to post-anesthesia care unit.                                                Lindaann Slough, M.D.    MN/MEDQ  D:  05/13/2003  T:  05/13/2003  Job:  [id number]

## 2011-02-12 NOTE — Discharge Summary (Signed)
NAMEMADISIN, HASAN NO.:  [id number]   MEDICAL RECORD NO.:  [id number]          PATIENT TYPE:  INP   LOCATION:  5036                         FACILITY:  [facility]   PHYSICIAN:  Burnard Bunting, M.D.    DATE OF BIRTH:  1951/08/24   DATE OF ADMISSION:  02/01/2006  DATE OF DISCHARGE:  02/08/2006                                 DISCHARGE SUMMARY   DISCHARGE DIAGNOSIS:  Painful total knee replacement.   SECONDARY DIAGNOSES:  1.  Diabetes.  2.  Hypertension.  3.  High cholesterol.   PROCEDURES:  Revision total knee replacement for Feb 01, 2006.   HOSPITAL COURSE:  [name] is a 60 year old patient with painful,  loose total knee replacement.  She underwent revision arthroplasty on Feb 01, 2006.  She tolerated the procedure well without immediate complications.  She was transferred to the recovery room in stable condition.  Patient had  some chest pain in the recovery room.  Cardiology consultation was obtained.  Plan was for telemetry monitoring to rule out MI.  She subsequently ruled  out for MI and was transferred to a telemetry bed.  Neurosensory was intact  in the left foot.  She was transfused 2 units of packed red blood cells on  post-op day #2 for hematocrit of 22.7.  A 2-D echo on post-op day #3 showed  EF of 65% with mild LVH.  Patient was started on Coumadin for DVT  prophylaxis and CPM for range of motion and physical therapy with  weightbearing as tolerated.  She had an otherwise unremarkable recovery.  Her incision was intact on post-op day #3.  She was started on anti-  inflammatory to increase her range of motion.  She was discharged to home in  good condition on Feb 08, 2006, with no fevers, intact incision.  CPM was 0  to 60.   DISCHARGE MEDICATIONS:  Vytorin, insulin, hydrochlorothiazide, as well as  Percocet for pain and Coumadin for DVT prophylaxis.           ______________________________  G. Dorene Grebe, M.D.     GSD/MEDQ  D:   03/29/2006  T:  03/30/2006  Job:  [id number]

## 2011-02-12 NOTE — Op Note (Signed)
NAME:  [name], [name]                      ACCOUNT NO.:  [id number]   MEDICAL RECORD NO.:  [id number]                   PATIENT TYPE:  INP   LOCATION:  5028                                 FACILITY:  [facility]   PHYSICIAN:  Burnard Bunting, M.D.                 DATE OF BIRTH:  Oct 28, 1950   DATE OF PROCEDURE:  12/31/2003  DATE OF DISCHARGE:                                 OPERATIVE REPORT   PREOPERATIVE DIAGNOSIS:  Left knee osteofibrosis.   POSTOPERATIVE DIAGNOSIS:  Left knee osteofibrosis.   PROCEDURE:  Left knee arthroscopic debridement and manipulation under  anesthesia.   SURGEON:  Burnard Bunting, M.D.   ASSISTANT:  Jerolyn Shin. Lavender, M.D.   ESTIMATED BLOOD LOSS:  25 mL.   DRAINS:  None.   TOURNIQUET TIME:  None.   FINDINGS:  Examination under anesthesia premanipulation and debridement; 70  degrees of flexion.  The patient lacked 10 degrees of full extension.  She  has good stability to varus and valgus stress at 0 and 30 degrees.   DESCRIPTION OF PROCEDURE:  At this time, the patient's knee was prepped with  Duraprep and draped in the usual sterile fashion.  [name] was used.  Preoperative IV antibiotics were administered.  Anterior and superior  lateral arthroscopic portal was created.  The suprapatellar pouch was then  recreated with the trocar and sleeve.  The second anterior inferior lateral  portal was then established in the space between the patella tendon and the  anterior medial tubercle was then also reestablished.  At this time, the  knee was manipulated into 110 degrees of flexion.  This was done maintaining  a small fulcrum and level arm placing the hand just below the tibial  tubercle.  Her flexion achieved was only 10 degrees less than the  contralateral extremity.  At this time, the scope was placed in the  suprapatellar pouch and there was found to be good restoration of the pouch.  Instruments were removed from the portals after a thorough irrigation.   The  portals were then closed using interrupted inverted 2-0 Vicryl suture and 3-  0 nylon suture.  A solution of Marcaine, morphine, and Clonidine was then  injected into the knee.  A bulky dressing and Ace wrap was placed.  The  patient was then transferred to the recovery room in stable condition.                                               Burnard Bunting, M.D.    GSD/MEDQ  D:  12/31/2003  T:  01/01/2004  Job:  [id number]

## 2011-02-12 NOTE — Discharge Summary (Signed)
NAMELAIBA, FUERTE NO.:  [id number]   MEDICAL RECORD NO.:  [id number]          PATIENT TYPE:  INP   LOCATION:  5039                         FACILITY:  [facility]   PHYSICIAN:  Burnard Bunting, M.D.    DATE OF BIRTH:  1951/04/14   DATE OF ADMISSION:  10/06/2004  DATE OF DISCHARGE:  10/09/2004                                 DISCHARGE SUMMARY   DISCHARGE DIAGNOSIS:  Left knee arthrofibrosis.   OPERATIONS/NOTABLE PROCEDURES:  Left knee arthrofibrosis excision and liner  exchange performed October 06, 2004.   HOSPITAL COURSE:  [name] is a 60 year old patient who is about a  year out from left knee replacement.  Her course has been complicated by  arthrofibrosis.  The patient underwent open arthrofibrosis, excision and  liner exchanged October 06, 2004.  Dorsiflexion and plantar flexion was  intact.  On postoperative day #1, she was started on CPM immediately.  In  the postoperative period, she was started on weightbearing as tolerated and  physical therapy.  She had an unremarkable postoperative recovery.  She was  on CPM 85 degrees on the day of discharge.  Her incision was intact.   The patient was discharged home in good condition, weightbearing as  tolerated, CPM minimum of six hours a day.   DISCHARGE MEDICATIONS:  Admission medication of Percocet one to two q.3-4h.  p.r.n. pain as well as Robaxin 500 mg p.o. q.6h. p.r.n. spasm.   I will see her back in seven days for suture removal.      GSD/MEDQ  D:  12/04/2004  T:  12/06/2004  Job:  [id number]

## 2011-03-10 ENCOUNTER — Other Ambulatory Visit: Payer: Self-pay | Admitting: Internal Medicine

## 2011-03-10 ENCOUNTER — Ambulatory Visit (HOSPITAL_COMMUNITY)
Admission: RE | Admit: 2011-03-10 | Discharge: 2011-03-10 | Disposition: A | Discharge status: Home / Self Care | Payer: Medicare Other | Source: Ambulatory Visit | Attending: Internal Medicine | Admitting: Internal Medicine

## 2011-03-10 ENCOUNTER — Encounter (HOSPITAL_BASED_OUTPATIENT_CLINIC_OR_DEPARTMENT_OTHER): Payer: Medicare Other | Admitting: Internal Medicine

## 2011-03-10 DIAGNOSIS — I517 Cardiomegaly: Secondary | ICD-10-CM | POA: Insufficient documentation

## 2011-03-10 DIAGNOSIS — C3410 Malignant neoplasm of upper lobe, unspecified bronchus or lung: Secondary | ICD-10-CM

## 2011-03-10 DIAGNOSIS — C3490 Malignant neoplasm of unspecified part of unspecified bronchus or lung: Secondary | ICD-10-CM | POA: Insufficient documentation

## 2011-03-10 LAB — CBC WITH DIFFERENTIAL/PLATELET
Basophils Absolute: 0 10*3/uL (ref 0.0–0.1)
Eosinophils Absolute: 0.1 10*3/uL (ref 0.0–0.5)
HCT: 37.9 % (ref 34.8–46.6)
HGB: 13.1 g/dL (ref 11.6–15.9)
MONO#: 0.4 10*3/uL (ref 0.1–0.9)
NEUT#: 3.6 10*3/uL (ref 1.5–6.5)
NEUT%: 66.6 % (ref 38.4–76.8)
RDW: 12.7 % (ref 11.2–14.5)
lymph#: 1.2 10*3/uL (ref 0.9–3.3)

## 2011-03-10 LAB — COMPREHENSIVE METABOLIC PANEL
ALT: 14 U/L (ref 0–35)
BUN: 15 mg/dL (ref 6–23)
CO2: 29 mEq/L (ref 19–32)
Calcium: 9.7 mg/dL (ref 8.4–10.5)
Chloride: 98 mEq/L (ref 96–112)
Creatinine, Ser: 0.81 mg/dL (ref 0.50–1.10)
Glucose, Bld: 237 mg/dL — ABNORMAL HIGH (ref 70–99)

## 2011-03-17 ENCOUNTER — Other Ambulatory Visit: Payer: Self-pay | Admitting: Internal Medicine

## 2011-03-17 ENCOUNTER — Encounter (HOSPITAL_BASED_OUTPATIENT_CLINIC_OR_DEPARTMENT_OTHER): Payer: Medicare Other | Admitting: Internal Medicine

## 2011-03-17 DIAGNOSIS — C3490 Malignant neoplasm of unspecified part of unspecified bronchus or lung: Secondary | ICD-10-CM

## 2011-03-17 DIAGNOSIS — C3410 Malignant neoplasm of upper lobe, unspecified bronchus or lung: Secondary | ICD-10-CM

## 2011-04-28 DIAGNOSIS — I739 Peripheral vascular disease, unspecified: Secondary | ICD-10-CM

## 2011-04-28 HISTORY — DX: Peripheral vascular disease, unspecified: I73.9

## 2011-05-17 ENCOUNTER — Ambulatory Visit (HOSPITAL_COMMUNITY)
Admission: RE | Admit: 2011-05-17 | Discharge: 2011-05-18 | Disposition: A | Discharge status: Home / Self Care | Payer: Medicare Other | Source: Ambulatory Visit | Attending: Cardiovascular Disease | Admitting: Cardiovascular Disease

## 2011-05-17 DIAGNOSIS — Y9210 Unspecified residential institution as the place of occurrence of the external cause: Secondary | ICD-10-CM | POA: Insufficient documentation

## 2011-05-17 DIAGNOSIS — I70219 Atherosclerosis of native arteries of extremities with intermittent claudication, unspecified extremity: Secondary | ICD-10-CM | POA: Insufficient documentation

## 2011-05-17 DIAGNOSIS — E119 Type 2 diabetes mellitus without complications: Secondary | ICD-10-CM | POA: Insufficient documentation

## 2011-05-17 DIAGNOSIS — IMO0002 Reserved for concepts with insufficient information to code with codable children: Secondary | ICD-10-CM | POA: Insufficient documentation

## 2011-05-17 DIAGNOSIS — I10 Essential (primary) hypertension: Secondary | ICD-10-CM | POA: Insufficient documentation

## 2011-05-17 DIAGNOSIS — I2510 Atherosclerotic heart disease of native coronary artery without angina pectoris: Secondary | ICD-10-CM | POA: Insufficient documentation

## 2011-05-17 DIAGNOSIS — E785 Hyperlipidemia, unspecified: Secondary | ICD-10-CM | POA: Insufficient documentation

## 2011-05-17 HISTORY — PX: LOWER EXTREMITY ANGIOGRAM: SHX5955

## 2011-05-17 LAB — GLUCOSE, CAPILLARY
Glucose-Capillary: 219 mg/dL — ABNORMAL HIGH (ref 70–99)
Glucose-Capillary: 188 mg/dL — ABNORMAL HIGH (ref 70–99)
Glucose-Capillary: 198 mg/dL — ABNORMAL HIGH (ref 70–99)
Glucose-Capillary: 284 mg/dL — ABNORMAL HIGH (ref 70–99)

## 2011-05-18 LAB — POCT ACTIVATED CLOTTING TIME
Activated Clotting Time: 215 seconds
Activated Clotting Time: 259 seconds

## 2011-05-18 LAB — CBC
HCT: 36.3 % (ref 36.0–46.0)
Hemoglobin: 12.5 g/dL (ref 12.0–15.0)
MCHC: 34.4 g/dL (ref 30.0–36.0)

## 2011-05-18 LAB — BASIC METABOLIC PANEL
BUN: 10 mg/dL (ref 6–23)
GFR calc non Af Amer: >60 mL/min (ref >60)
Glucose, Bld: 253 mg/dL — ABNORMAL HIGH (ref 70–99)
Potassium: 3.5 mEq/L (ref 3.5–5.1)

## 2011-05-19 NOTE — Procedures (Signed)
NAMENATHIFA, RITTHALER NO.:  [id number]  MEDICAL RECORD NO.:  [id number]  LOCATION:  6526                         FACILITY:  [facility]  PHYSICIAN:  Nanetta Batty, M.D.   DATE OF BIRTH:  03-11-1951  DATE OF PROCEDURE: DATE OF DISCHARGE:                   PERIPHERAL VASCULAR INVASIVE PROCEDURE   HISTORY OF PRESENT ILLNESS:  Ms. Berkery is a 60 year old moderately overweight African American female with multiple cardiac risk factors including hypertension, hyperlipidemia, and some diabetes.  She recently had a negative Myoview, but has been complaining of left lower extremity claudication.  Dopplers in our office suggested high-grade disease in her proximal left SFA.  She is referred by Dr. Royann Shivers for angiography and potential percutaneous intervention for lifestyle-limiting claudication.  PROCEDURE DESCRIPTION:  The patient is brought to the second floor [facility] angiographic suite in the post absorptive state.  She is premedicated p.o. Valium, IV Versed, and fentanyl.  Her right groin was prepped and shaved in usual sterile fashion, and 1% Xylocaine was used for local anesthesia.  A 5-French sheath was inserted into the right femoral artery using standard Seldinger technique.  A 5-French tennis racquet catheter was used for midstream and distal abdominal aortography with bifemoral runoff using bolus-chase digital subtraction step table technique.  Visipaque dye was used for the entirety of the case. Retrograde aortic pressure is monitored during the case.  ANGIOGRAPHIC RESULTS: 1. Abdominal aorta: 1a:  Renal artery - 50% proximal right renal artery stenosis. 1b:  Infrarenal abdominal aorta - normal. 1. Left lower extremity; 2a.  30% segmental ostial/proximal left SFA stenosis. 2b. Tandem 70-80% proximal left SFA stenosis. 2c. a three-vessel runoff. 1. Right lower extremity; 3a. 40-50% segmental mid right SFA and three-vessel runoff.  PROCEDURE  DESCRIPTION:  A contralateral access was obtained with a crossover catheter, end-hole, and 7-French crossover sheath.  The patient received total 9000 units of heparin intravenously with an ACT of 215.  Total contrast used during the case was 215 mL of contrast.  Using a Spartacore wire, the lesion was crossed.  Distal protection was first obtained with a 6-mm Spider distal protection device, then the Spartacore wire which was then withdrawn.  Directional atherectomy was then performed on the proximal left SFA using a LX -Man TurboHawk directional atherectomy device.  Total of  six cuts were performed in two cutting sessions.  Large/copious amount of white fibrous plaque was then obtained.  Final low pressure balloon angioplasty was performed with a 5 x 120 FoxCross balloon 3 atmospheres, resulting in reduction of tandem 70-80% proximal left SFA stenoses, less than 20% residual without obvious dissection.  There was three-vessel runoff at the end of case documented angiographically.  The Spider distal protection device was then captured and withdrawn from the body. The sheath was then pulled back across the bifurcation.  The patient left lab in stable condition.  She will be treated with aspirin and Plavix, gently hydrated, discharged home in the morning.  She will get follow-up Dopplers and ABIs, in the office.  I will see back in the office in one or two weeks' in follow- up.  She left lab in stable condition.     Nanetta Batty, M.D.     JB/MEDQ  D:  05/17/2011  T:  05/18/2011  Job:  [id number]  cc:   Thurmon Fair, MD Joellyn Rued, PA-C  Electronically Signed by Nanetta Batty M.D. on 05/19/2011 04:54:09 PM

## 2011-05-28 NOTE — Discharge Summary (Signed)
[name], [name] NO.:  [id number]  MEDICAL RECORD NO.:  [id number]  LOCATION:  6526                         FACILITY:  [facility]  PHYSICIAN:  Landry Corporal, MD DATE OF BIRTH:  January 12, 1951  DATE OF ADMISSION:  05/17/2011 DATE OF DISCHARGE:  05/18/2011                              DISCHARGE SUMMARY   DISCHARGE DIAGNOSES: 1. Peripheral vascular disease, status post directional atherectomy     and low-pressure balloon angioplasty to the left superficial     femoral artery. 2. Dyslipidemia, treated. 3. Hypertension, treated. 4. Obesity, morbid. 5. History of coronary atherosclerosis. 6. Diabetes mellitus type 2, treated. 7. Skin tear approximately 3 cm in length, the right inguinal crease     lateral to cath site, treatment with bacitracin.  HOSPITAL COURSE:  [name] is a patient 60 year old obese African American female with history of coronary atherosclerosis, hypertension, dyslipidemia, diabetes mellitus type 2, and obesity.  She undergone peripheral arterial Dopplers to her lower extremities.  This revealed a 50-69% stenosis in the right superficial femoral artery especially the lateral distal segment.  Two-vessel runoff in the cath.  On the left side, there was 70-99% stenosis in the superficial femoral artery, especially prominent proximal portion of the vessel.  There was a loose two-vessel runoff.  ABIs were 1.1 on the right and 1.0 on the left.  She presented for lower extremity peripheral angiogram and subsequently went directional atherectomy to left SFA and low-pressure balloon angioplasty.  Currently, she is stable.  Without hematoma, ecchymosis, or erythema to the cath site.  She had been seen by Dr. Herbie Baltimore and stable for discharge home.  DISCHARGE LABS:  WBC 5.8, hemoglobin 12.5, hematocrit 36.3, platelets 164, sodium 137, potassium 3.5, chloride 101, carbon dioxide 28, BUN 10, creatinine 0.61, glucose 253, and calcium  9.3.  STUDIES/PROCEDURES:  Lower extremity arterial angiogram.  Angiographic results, renal arteries showed 50% proximal right renal artery stenosis. Infrarenal abdominal aorta was normal.  Left lower extremity showed 30% segmental ostial/proximal left SFA stenosis.  Tandem 70 to 80% proximal left SFA stenosis.  A three-vessel runoff.  Right lower extremity showed 40-50% segmental mid right SFA and three-vessel runoff.  Directional atherectomy was performed on the proximal left SFA using LX - M TurboHawk directional atherectomy device and low-pressure balloon angioplasty.  DISCHARGE MEDICATIONS: 1. Plavix 75 mg 1 tablet by mouth daily with meal. 2. Crestor 20 mg 1 tablet by mouth daily. 3. Aspirin 81 mg 1 tablet by mouth daily. 4. Dexilant 60 mg 1 capsule by mouth daily. 5. Hydrochlorothiazide/lisinopril 25/20 mg 1 tablet by mouth daily at     bedtime. 6. NovoLog 5 units subcutaneously 3 times a day before meals. 7. Reglan 10 mg 1 tablet by mouth 4 times a day with meals before     bedtime. 8. Vitamin D over-the-counter 1 tablet by mouth daily.  DISPOSITION:  [name] was discharged home in stable condition. Recommend, she is to increase her activity slowly, she may shower and bathe.  No lifting or driving for 3 days.  Recommend, she is to take a low-carbohydrate heart-healthy diet.  If the catheter site becomes red, painful, swollen, or discharges fluid or pus, she is to  call our office immediately.  She is also recommend she apply bacitracin to skin tear in the right groin twice daily and keep it clean.  She will follow up with for lower extremity ultrasound June 08, 2011 at 3 p.m. and then with Dr. [name] in followup on September 17 at 3:45 p.m.    ______________________________ Wilburt Finlay, PA   ______________________________ Landry Corporal, MD    BH/MEDQ  D:  05/18/2011  T:  05/18/2011  Job:  [id number]  cc:   Georganna Skeans, MD  Electronically Signed  by Wilburt Finlay PA on 05/27/2011 12:21:19 PM Electronically Signed by Bryan Lemma MD on 05/28/2011 11:37:52 PM

## 2011-07-26 ENCOUNTER — Telehealth: Payer: Self-pay | Admitting: Pulmonary Disease

## 2011-07-26 NOTE — Telephone Encounter (Signed)
All Dr.Clance Records faxed to [facility] specialty Surgical @ [phone]  07/26/11/km

## 2011-09-09 ENCOUNTER — Other Ambulatory Visit (HOSPITAL_BASED_OUTPATIENT_CLINIC_OR_DEPARTMENT_OTHER): Payer: Medicare Other | Admitting: Lab

## 2011-09-09 ENCOUNTER — Other Ambulatory Visit: Payer: Self-pay | Admitting: Internal Medicine

## 2011-09-09 ENCOUNTER — Ambulatory Visit (HOSPITAL_COMMUNITY)
Admission: RE | Admit: 2011-09-09 | Discharge: 2011-09-09 | Disposition: A | Discharge status: Home / Self Care | Payer: Medicare Other | Source: Ambulatory Visit | Attending: Internal Medicine | Admitting: Internal Medicine

## 2011-09-09 DIAGNOSIS — C3490 Malignant neoplasm of unspecified part of unspecified bronchus or lung: Secondary | ICD-10-CM | POA: Insufficient documentation

## 2011-09-09 DIAGNOSIS — C3410 Malignant neoplasm of upper lobe, unspecified bronchus or lung: Secondary | ICD-10-CM

## 2011-09-09 LAB — CBC WITH DIFFERENTIAL/PLATELET
BASO%: 0.3 % (ref 0.0–2.0)
EOS%: 1.2 % (ref 0.0–7.0)
Eosinophils Absolute: 0.1 10*3/uL (ref 0.0–0.5)
LYMPH%: 26.6 % (ref 14.0–49.7)
MCH: 28 pg (ref 25.1–34.0)
MCHC: 34.3 g/dL (ref 31.5–36.0)
MCV: 81.7 fL (ref 79.5–101.0)
MONO%: 6.3 % (ref 0.0–14.0)
NEUT#: 3.8 10*3/uL (ref 1.5–6.5)
Platelets: 214 10*3/uL (ref 145–400)
RBC: 4.53 10*6/uL (ref 3.70–5.45)
RDW: 12.5 % (ref 11.2–14.5)
nRBC: 0 % (ref 0–0)

## 2011-09-09 LAB — COMPREHENSIVE METABOLIC PANEL
ALT: 11 U/L (ref 0–35)
Alkaline Phosphatase: 81 U/L (ref 39–117)
Creatinine, Ser: 0.72 mg/dL (ref 0.50–1.10)
Sodium: 138 mEq/L (ref 135–145)
Total Bilirubin: 0.5 mg/dL (ref 0.3–1.2)
Total Protein: 7.3 g/dL (ref 6.0–8.3)

## 2011-09-16 ENCOUNTER — Encounter: Payer: Self-pay | Admitting: Internal Medicine

## 2011-09-16 ENCOUNTER — Ambulatory Visit (HOSPITAL_BASED_OUTPATIENT_CLINIC_OR_DEPARTMENT_OTHER): Payer: Medicare Other | Admitting: Internal Medicine

## 2011-09-16 VITALS — BP 132/74 | HR 81 | Temp 96.9°F | Ht 63.5 in | Wt 263.4 lb

## 2011-09-16 DIAGNOSIS — R0602 Shortness of breath: Secondary | ICD-10-CM

## 2011-09-16 DIAGNOSIS — C3490 Malignant neoplasm of unspecified part of unspecified bronchus or lung: Secondary | ICD-10-CM

## 2011-09-16 DIAGNOSIS — Z85118 Personal history of other malignant neoplasm of bronchus and lung: Secondary | ICD-10-CM

## 2011-09-16 NOTE — Progress Notes (Signed)
[facility] OFFICE PROGRESS NOTE  Georganna Skeans, MD, MD [phone] [address] [place] [address]  DIAGNOSIS: :  Stage IA (T1b N0 MX) non-small cell lung cancer, adenocarcinoma diagnosed in February 2010.  PRIOR THERAPY: Status post left upper lobectomy with node dissection under the care of Dr. Edwyna Shell on Feb 11, 2009.  CURRENT THERAPY: Observation.  INTERVAL HISTORY: [name] 60 y.o. female returns to the clinic today for her routine six-month followup visit. The patient has no complaints today except for shortness of breath with exertion. She denied having any significant chest pain, no cough, no hemoptysis. No weight loss or night sweats. No nausea or vomiting. She has repeat CT scan of the chest performed recently and she is here today for evaluation and discussion of her scan results.  MEDICAL HISTORY: Past Medical History  Diagnosis Date  . Hypertension   . Diabetes mellitus   . Hypercholesteremia     ALLERGIES:   has no known allergies.  MEDICATIONS:  Current Outpatient Prescriptions  Medication Sig Dispense Refill  . aspirin 81 MG tablet Take 81 mg by mouth daily.        . Cholecalciferol (VITAMIN D PO) Take 2,000 Units by mouth daily.        . clopidogrel (PLAVIX) 75 MG tablet Take 75 mg by mouth daily.        Marland Kitchen Dexlansoprazole (DEXILANT PO) Take 1 tablet by mouth daily.        . insulin aspart (NOVOLOG) 100 UNIT/ML injection Inject into the skin 3 (three) times daily before meals.        Marland Kitchen lisinopril-hydrochlorothiazide (PRINZIDE,ZESTORETIC) 20-25 MG per tablet Take 1 tablet by mouth daily.        . Metoclopramide HCl (REGLAN PO) Take by mouth daily.        . rosuvastatin (CRESTOR) 20 MG tablet Take 20 mg by mouth daily.          SURGICAL HISTORY:  Past Surgical History  Procedure Date  . Abdominal hysterectomy   . Cholecystectomy   . Left knee replacement     REVIEW OF SYSTEMS:  A comprehensive review of systems was negative except for:  Respiratory: positive for dyspnea on exertion   PHYSICAL EXAMINATION: General appearance: alert, cooperative and no distress Head: Normocephalic, without obvious abnormality, atraumatic Neck: no adenopathy Lymph nodes: Cervical, supraclavicular, and axillary nodes normal. Resp: clear to auscultation bilaterally Cardio: regular rate and rhythm, S1, S2 normal, no murmur, click, rub or gallop GI: soft, non-tender; bowel sounds normal; no masses,  no organomegaly Extremities: extremities normal, atraumatic, no cyanosis or edema Neurologic: Alert and oriented X 3, normal strength and tone. Normal symmetric reflexes. Normal coordination and gait  ECOG PERFORMANCE STATUS: 1 - Symptomatic but completely ambulatory  Blood pressure 132/74, pulse 81, temperature 96.9 F (36.1 C), temperature source Oral, height 5' 3.5" (1.613 m), weight 263 lb 6.4 oz (119.477 kg).  LABORATORY DATA: Lab Results  Component Value Date   WBC 5.8 09/09/2011   HGB 12.7 09/09/2011   HCT 37.0 09/09/2011   MCV 81.7 09/09/2011   PLT 214 09/09/2011      Chemistry      Component Value Date/Time   NA 138 09/09/2011 0958   NA 134 09/14/2010 0939   K 4.0 09/09/2011 0958   K 3.9 09/14/2010 0939   CL 98 09/09/2011 0958   CL 96* 09/14/2010 0939   CO2 30 09/09/2011 0958   CO2 31 09/14/2010 0939   BUN  10 09/09/2011 0958   BUN 14 09/14/2010 0939   CREATININE 0.72 09/09/2011 0958   CREATININE 0.7 09/14/2010 0939      Component Value Date/Time   CALCIUM 9.9 09/09/2011 0958   CALCIUM 9.3 09/14/2010 0939   ALKPHOS 81 09/09/2011 0958   ALKPHOS 93* 09/14/2010 0939   AST 17 09/09/2011 0958   AST 23 09/14/2010 0939   ALT 11 09/09/2011 0958   BILITOT 0.5 09/09/2011 0958   BILITOT 0.60 09/14/2010 0939       RADIOGRAPHIC STUDIES: Ct Chest Wo Contrast  09/09/2011  *RADIOLOGY REPORT*  Clinical Data: Lung cancer.  CT CHEST WITHOUT CONTRAST  Technique:  Multidetector CT imaging of the chest was performed following the  standard protocol without IV contrast.  Comparison: 03/10/2011.  Findings: Low left paratracheal lymph node measures slightly smaller, 10 mm, compared to 13 mm on 03/10/2011.  Hilar regions are difficult to definitively evaluate without IV contrast.  No axillary adenopathy.  Heart is at the upper limits of normal in size.  Small amount of pericardial fluid may be physiologic.  Tiny hiatal hernia.  Postoperative changes of left upper lobectomy.  Lungs are otherwise clear.  No pleural fluid.  Airway is otherwise unremarkable.  Incidental imaging of the upper abdomen shows no acute findings. No worrisome lytic or sclerotic lesions.  IMPRESSION:  1.  Postoperative changes of left upper lobectomy without evidence of recurrent or metastatic disease. 2.  Low left paratracheal lymph node measures slightly smaller on today's exam.  Original Report Authenticated By: Reyes Ivan, M.D.    ASSESSMENT: This is a very pleasant 60 years old [place] American female with history of stage IA non-small cell lung cancer diagnosed in February of 2010, status post left upper lobectomy with lymph node dissection. The patient is doing fine today and she has no evidence for disease recurrence. I discussed the scan results with the patient.  PLAN: I recommended for her continuous observation for now. I would see her back for followup visit in 6 months with repeat CT scan of the chest.   All questions were answered. The patient knows to call the clinic with any problems, questions or concerns. We can certainly see the patient much sooner if necessary.

## 2011-09-28 HISTORY — PX: CATARACT EXTRACTION W/ INTRAOCULAR LENS  IMPLANT, BILATERAL: SHX1307

## 2011-11-04 DIAGNOSIS — Z713 Dietary counseling and surveillance: Secondary | ICD-10-CM | POA: Diagnosis not present

## 2011-11-04 DIAGNOSIS — Z7182 Exercise counseling: Secondary | ICD-10-CM | POA: Diagnosis not present

## 2011-11-04 DIAGNOSIS — E119 Type 2 diabetes mellitus without complications: Secondary | ICD-10-CM | POA: Diagnosis not present

## 2011-11-04 DIAGNOSIS — E669 Obesity, unspecified: Secondary | ICD-10-CM | POA: Diagnosis not present

## 2011-11-08 DIAGNOSIS — E119 Type 2 diabetes mellitus without complications: Secondary | ICD-10-CM | POA: Diagnosis not present

## 2011-11-18 DIAGNOSIS — M25569 Pain in unspecified knee: Secondary | ICD-10-CM | POA: Diagnosis not present

## 2011-11-18 DIAGNOSIS — M25519 Pain in unspecified shoulder: Secondary | ICD-10-CM | POA: Diagnosis not present

## 2011-11-22 ENCOUNTER — Other Ambulatory Visit: Payer: Self-pay

## 2011-11-22 ENCOUNTER — Emergency Department (HOSPITAL_COMMUNITY): Payer: Medicare Other

## 2011-11-22 ENCOUNTER — Emergency Department (HOSPITAL_COMMUNITY)
Admission: EM | Admit: 2011-11-22 | Discharge: 2011-11-23 | Disposition: A | Discharge status: Home / Self Care | Payer: Medicare Other | Attending: Emergency Medicine | Admitting: Emergency Medicine

## 2011-11-22 ENCOUNTER — Encounter (HOSPITAL_COMMUNITY): Payer: Self-pay | Admitting: Emergency Medicine

## 2011-11-22 DIAGNOSIS — R079 Chest pain, unspecified: Secondary | ICD-10-CM | POA: Diagnosis not present

## 2011-11-22 DIAGNOSIS — I10 Essential (primary) hypertension: Secondary | ICD-10-CM | POA: Diagnosis not present

## 2011-11-22 DIAGNOSIS — Z85118 Personal history of other malignant neoplasm of bronchus and lung: Secondary | ICD-10-CM | POA: Diagnosis not present

## 2011-11-22 DIAGNOSIS — Z794 Long term (current) use of insulin: Secondary | ICD-10-CM | POA: Insufficient documentation

## 2011-11-22 DIAGNOSIS — R0602 Shortness of breath: Secondary | ICD-10-CM | POA: Insufficient documentation

## 2011-11-22 DIAGNOSIS — E119 Type 2 diabetes mellitus without complications: Secondary | ICD-10-CM | POA: Insufficient documentation

## 2011-11-22 DIAGNOSIS — R110 Nausea: Secondary | ICD-10-CM | POA: Insufficient documentation

## 2011-11-22 LAB — DIFFERENTIAL
Eosinophils Absolute: 0.1 10*3/uL (ref 0.0–0.7)
Eosinophils Relative: 1 % (ref 0–5)
Lymphocytes Relative: 23 % (ref 12–46)
Lymphs Abs: 2 10*3/uL (ref 0.7–4.0)
Monocytes Absolute: 0.6 10*3/uL (ref 0.1–1.0)
Monocytes Relative: 7 % (ref 3–12)

## 2011-11-22 LAB — CBC
HCT: 39.4 % (ref 36.0–46.0)
Hemoglobin: 13.8 g/dL (ref 12.0–15.0)
MCH: 28.8 pg (ref 26.0–34.0)
MCV: 82.3 fL (ref 78.0–100.0)
Platelets: 256 10*3/uL (ref 150–400)
RBC: 4.79 MIL/uL (ref 3.87–5.11)
WBC: 9 10*3/uL (ref 4.0–10.5)

## 2011-11-22 LAB — POCT I-STAT, CHEM 8
BUN: 17 mg/dL (ref 6–23)
Creatinine, Ser: 0.8 mg/dL (ref 0.50–1.10)
Glucose, Bld: 139 mg/dL — ABNORMAL HIGH (ref 70–99)
Sodium: 140 mEq/L (ref 135–145)
TCO2: 31 mmol/L (ref 0–100)

## 2011-11-22 MED ORDER — GI COCKTAIL ~~LOC~~
30.0000 mL | Freq: Once | ORAL | Status: AC
  Administered 2011-11-22: 30 mL via ORAL
  Filled 2011-11-22: qty 30

## 2011-11-22 MED ORDER — SODIUM CHLORIDE 0.9 % IV SOLN
Freq: Once | INTRAVENOUS | Status: AC
  Administered 2011-11-22: via INTRAVENOUS

## 2011-11-22 NOTE — ED Provider Notes (Signed)
History     CSN: [id number]  Arrival date & time 11/22/11  2233   First MD Initiated Contact with Patient 11/22/11 2254      Chief Complaint  Patient presents with  . Chest Pain  . Nausea    (Consider location/radiation/quality/duration/timing/severity/associated sxs/prior treatment) HPI Comments: 61 year old female with a history of diabetes, hypertension, left lung cancer as well as high cholesterol, acid reflux, hiatal hernia. She presents with a complaint of chest pain which has been going on for "a long time". She states that it happens approximately 2 times a week, it is always at night and first thing in the morning. It seems to get worse when she lays down, has a burning quality and occasionally has radiation to the left shoulder. She does note that she had a cortisone injection in her left shoulder recently and thinks that the pain is from this etiology. She had a cardiac catheterization in 2010 showing no significant obstructions, a stress test in July 2012 was normal per her report and is currently taking Plavix due 2 and obstructive arterial blockage in her left thigh for which she underwent angioplasty in August of last year. Currently her pain is about 5/10, burning, middle of the chest. She does take daily Protonix but this did not help her tonight.  She does endorse having any acid taste in her mouth in the morning, sore throat when she wakes up in the morning. She denies back pain, shortness of breath, cough, fever, nausea or vomiting, swelling in the legs, travel, trauma  Patient is a 61 y.o. female presenting with chest pain. The history is provided by the patient and medical records.  Chest Pain     Past Medical History  Diagnosis Date  . Hypertension   . Diabetes mellitus   . Hypercholesteremia     Past Surgical History  Procedure Date  . Abdominal hysterectomy   . Cholecystectomy   . Left knee replacement   . Lung surgery     Family History  Problem  Relation Age of Onset  . Hypertension Mother   . Stroke Father     History  Substance Use Topics  . Smoking status: Former Smoker -- 1.0 packs/day for 20 years    Types: Cigarettes  . Smokeless tobacco: Not on file  . Alcohol Use: No    OB History    Grav Para Term Preterm Abortions TAB SAB Ect Mult Living                  Review of Systems  Cardiovascular: Positive for chest pain.  All other systems reviewed and are negative.    Allergies  Levaquin and Morphine and related  Home Medications   Current Outpatient Rx  Name Route Sig Dispense Refill  . ASPIRIN 81 MG PO TABS Oral Take 81 mg by mouth daily.      . ATORVASTATIN CALCIUM 80 MG PO TABS Oral Take 80 mg by mouth daily.    Marland Kitchen VITAMIN D PO Oral Take 2,000 Units by mouth daily.      Marland Kitchen CLOPIDOGREL BISULFATE 75 MG PO TABS Oral Take 75 mg by mouth daily.      . INSULIN ASPART 100 UNIT/ML [place] SOLN Subcutaneous Inject 5-10 Units into the skin 3 (three) times daily before meals.     Marland Kitchen LISINOPRIL-HYDROCHLOROTHIAZIDE 20-25 MG PO TABS Oral Take 1 tablet by mouth daily.      Marland Kitchen METOCLOPRAMIDE HCL 10 MG PO TABS Oral Take 10 mg  by mouth 4 (four) times daily as needed. Nausea    . PANTOPRAZOLE SODIUM 40 MG PO TBEC Oral Take 40 mg by mouth daily.    Marland Kitchen FAMOTIDINE 20 MG PO TABS Oral Take 1 tablet (20 mg total) by mouth 2 (two) times daily. 30 tablet 0    BP 171/59  Pulse 61  Temp 98 F (36.7 C)  Resp 16  SpO2 100%  Physical Exam  Nursing note and vitals reviewed. Constitutional: She appears well-developed and well-nourished. No distress.  HENT:  Head: Normocephalic and atraumatic.  Mouth/Throat: Oropharynx is clear and moist. No oropharyngeal exudate.  Eyes: Conjunctivae and EOM are normal. Pupils are equal, round, and reactive to light. Right eye exhibits no discharge. Left eye exhibits no discharge. No scleral icterus.  Neck: Normal range of motion. Neck supple. No JVD present. No thyromegaly present.  Cardiovascular:  Normal rate, regular rhythm, normal heart sounds and intact distal pulses.  Exam reveals no gallop and no friction rub.   No murmur heard. Pulmonary/Chest: Effort normal and breath sounds normal. No respiratory distress. She has no wheezes. She has no rales.  Abdominal: Soft. Bowel sounds are normal. She exhibits no distension and no mass. There is no tenderness.  Musculoskeletal: Normal range of motion. She exhibits no edema and no tenderness.  Lymphadenopathy:    She has no cervical adenopathy.  Neurological: She is alert. Coordination normal.  Skin: Skin is warm and dry. No rash noted. No erythema.  Psychiatric: She has a normal mood and affect. Her behavior is normal.    ED Course  Procedures (including critical care time)  ED ECG REPORT   Date: 11/23/2011   Rate: 63  Rhythm: normal sinus rhythm  QRS Axis: normal  Intervals: normal  ST/T Wave abnormalities: nonspecific ST/T changes  Conduction Disutrbances:none  Narrative Interpretation:   Old EKG Reviewed: unchanged and 01/23/2011, no significant changes   Labs Reviewed  BASIC METABOLIC PANEL - Abnormal; Notable for the following:    Potassium 3.3 (*)    Glucose, Bld 136 (*)    GFR calc non Af Amer 76 (*)    GFR calc Af Amer 88 (*)    All other components within normal limits  POCT I-STAT, CHEM 8 - Abnormal; Notable for the following:    Potassium 3.3 (*)    Glucose, Bld 139 (*)    All other components within normal limits  CBC  DIFFERENTIAL  POCT I-STAT TROPONIN I   Dg Chest 2 View  11/23/2011  *RADIOLOGY REPORT*  Clinical Data: Chest pain and shortness of breath. History of lung cancer.  CHEST - 2 VIEW  Comparison: 01/24/2011  Findings: Two views of the chest demonstrate volume loss and postsurgical changes in left hemithorax.  There is chronic haziness in the left lower lung related to volume loss.  Right lung is clear.  Trachea is midline.  No evidence for pleural effusions. Stable appearance of the heart.   IMPRESSION: Postsurgical changes in left hemithorax with volume loss.  No acute findings.  Original Report Authenticated By: Richarda Overlie, M.D.     1. Chest pain       MDM  Well appearing, vital signs with mild hypertension at 171/59. EKG is unchanged from prior EKG and shows no acute ischemia. Acid reflux likely scenario, treat with GI cocktail while ruling out cardiac disease with laboratory testing.  Laboratory results unremarkable, this includes troponin, blood counts, electrolytes and renal function. Chest x-ray shows no acute findings, personally reviewed the  x-ray and agree with the findings.  Improve significantly with GI cocktail further lending evidence that this is likely a acid reflux phenomenon and not cardiac. Given unchanged and nonischemic EKG and the above findings will start patient on Pepcid and have followup this week.        Vida Roller, MD 11/23/11 [phone]

## 2011-11-22 NOTE — ED Notes (Signed)
Presents to ED with c/o chest pain, "burning pain" which radiates to neck and left arm; c/o nausea-- states "a little".  Denies dizziness.

## 2011-11-23 LAB — POCT I-STAT TROPONIN I

## 2011-11-23 LAB — BASIC METABOLIC PANEL
CO2: 31 mEq/L (ref 19–32)
Chloride: 97 mEq/L (ref 96–112)
Glucose, Bld: 136 mg/dL — ABNORMAL HIGH (ref 70–99)
Potassium: 3.3 mEq/L — ABNORMAL LOW (ref 3.5–5.1)
Sodium: 137 mEq/L (ref 135–145)

## 2011-11-23 MED ORDER — FAMOTIDINE 20 MG PO TABS: 20.0000 mg | ORAL_TABLET | Freq: Two times a day (BID) | ORAL | Status: DC

## 2011-11-23 NOTE — Discharge Instructions (Signed)
Your testing is all been normal, there is no signs of heart attack today. Please start taking Pepcid twice a day, return to the emergency department for severe or worsening pain or difficulty breathing. Please call or Dr. in the morning to arrange an expedited followup and please call your cardiologist to see them later this week.

## 2011-11-26 ENCOUNTER — Emergency Department (HOSPITAL_COMMUNITY): Payer: Medicare Other

## 2011-11-26 ENCOUNTER — Emergency Department (HOSPITAL_COMMUNITY)
Admission: EM | Admit: 2011-11-26 | Discharge: 2011-11-26 | Disposition: A | Discharge status: Home / Self Care | Payer: Medicare Other | Attending: Emergency Medicine | Admitting: Emergency Medicine

## 2011-11-26 ENCOUNTER — Encounter (HOSPITAL_COMMUNITY): Payer: Self-pay | Admitting: Emergency Medicine

## 2011-11-26 DIAGNOSIS — S8000XA Contusion of unspecified knee, initial encounter: Secondary | ICD-10-CM | POA: Diagnosis not present

## 2011-11-26 DIAGNOSIS — R079 Chest pain, unspecified: Secondary | ICD-10-CM | POA: Insufficient documentation

## 2011-11-26 DIAGNOSIS — E119 Type 2 diabetes mellitus without complications: Secondary | ICD-10-CM | POA: Insufficient documentation

## 2011-11-26 DIAGNOSIS — Z85118 Personal history of other malignant neoplasm of bronchus and lung: Secondary | ICD-10-CM | POA: Insufficient documentation

## 2011-11-26 DIAGNOSIS — T1490XA Injury, unspecified, initial encounter: Secondary | ICD-10-CM | POA: Diagnosis not present

## 2011-11-26 DIAGNOSIS — E7800 Pure hypercholesterolemia, unspecified: Secondary | ICD-10-CM | POA: Insufficient documentation

## 2011-11-26 DIAGNOSIS — S298XXA Other specified injuries of thorax, initial encounter: Secondary | ICD-10-CM | POA: Diagnosis not present

## 2011-11-26 DIAGNOSIS — I10 Essential (primary) hypertension: Secondary | ICD-10-CM | POA: Insufficient documentation

## 2011-11-26 DIAGNOSIS — S79919A Unspecified injury of unspecified hip, initial encounter: Secondary | ICD-10-CM | POA: Diagnosis not present

## 2011-11-26 DIAGNOSIS — M25469 Effusion, unspecified knee: Secondary | ICD-10-CM | POA: Insufficient documentation

## 2011-11-26 DIAGNOSIS — Z043 Encounter for examination and observation following other accident: Secondary | ICD-10-CM | POA: Diagnosis not present

## 2011-11-26 DIAGNOSIS — M25569 Pain in unspecified knee: Secondary | ICD-10-CM | POA: Insufficient documentation

## 2011-11-26 DIAGNOSIS — Z7982 Long term (current) use of aspirin: Secondary | ICD-10-CM | POA: Insufficient documentation

## 2011-11-26 DIAGNOSIS — Z79899 Other long term (current) drug therapy: Secondary | ICD-10-CM | POA: Insufficient documentation

## 2011-11-26 DIAGNOSIS — S8990XA Unspecified injury of unspecified lower leg, initial encounter: Secondary | ICD-10-CM | POA: Diagnosis not present

## 2011-11-26 DIAGNOSIS — S79929A Unspecified injury of unspecified thigh, initial encounter: Secondary | ICD-10-CM | POA: Diagnosis not present

## 2011-11-26 DIAGNOSIS — Z794 Long term (current) use of insulin: Secondary | ICD-10-CM | POA: Insufficient documentation

## 2011-11-26 DIAGNOSIS — M25562 Pain in left knee: Secondary | ICD-10-CM

## 2011-11-26 DIAGNOSIS — S7012XA Contusion of left thigh, initial encounter: Secondary | ICD-10-CM

## 2011-11-26 DIAGNOSIS — S7010XA Contusion of unspecified thigh, initial encounter: Secondary | ICD-10-CM | POA: Diagnosis not present

## 2011-11-26 DIAGNOSIS — IMO0002 Reserved for concepts with insufficient information to code with codable children: Secondary | ICD-10-CM | POA: Diagnosis not present

## 2011-11-26 DIAGNOSIS — S8002XA Contusion of left knee, initial encounter: Secondary | ICD-10-CM

## 2011-11-26 HISTORY — DX: Malignant (primary) neoplasm, unspecified: C80.1

## 2011-11-26 HISTORY — DX: Malignant neoplasm of unspecified part of unspecified bronchus or lung: C34.90

## 2011-11-26 MED ORDER — HYDROCODONE-ACETAMINOPHEN 5-500 MG PO TABS: 1.0000 | ORAL_TABLET | Freq: Four times a day (QID) | ORAL | Status: AC | PRN

## 2011-11-26 MED ORDER — FENTANYL CITRATE 0.05 MG/ML IJ SOLN
50.0000 ug | Freq: Once | INTRAMUSCULAR | Status: AC
  Administered 2011-11-26: 50 ug via INTRAVENOUS
  Filled 2011-11-26: qty 2

## 2011-11-26 MED ORDER — ONDANSETRON HCL 4 MG/2ML IJ SOLN: 4.0000 mg | Freq: Once | INTRAMUSCULAR | Status: DC

## 2011-11-26 MED ORDER — ONDANSETRON HCL 4 MG/2ML IJ SOLN
INTRAMUSCULAR | Status: AC
  Administered 2011-11-26: 4 mg
  Filled 2011-11-26: qty 2

## 2011-11-26 NOTE — ED Notes (Signed)
Pt. Assisted w/ toileting, repositioned in the bed. Pt. C/o pain in left knee. Dr. Juleen China notified of pain complaint. No other needs voiced.

## 2011-11-26 NOTE — ED Notes (Signed)
Pt alert and talking with family; no signs of distress. Pt to ambulate to restroom with RN assistance.
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
## 2011-11-26 NOTE — Progress Notes (Signed)
Orthopedic Tech Progress Note Patient Details:  TAWANNA FUNK Dec 23, 1950 [id number]  Other Ortho Devices Type of Ortho Device: Knee Immobilizer Ortho Device Location: (L) LE Ortho Device Interventions: Application   Jennye Moccasin 11/26/2011, 11:11 PM

## 2011-11-26 NOTE — ED Notes (Signed)
Ortho tech paged  

## 2011-11-26 NOTE — ED Notes (Signed)
Family at bedside. 

## 2011-11-26 NOTE — ED Notes (Signed)
PT left in wheelchair; VSS; no signs of distress; A&Ox3; respirations even and unlabored; skin warm and dry.

## 2011-11-26 NOTE — ED Notes (Signed)
Pharmacy tech at bedside; pt resting comforatbly; no signs of distress.

## 2011-11-26 NOTE — ED Notes (Signed)
NO questions at this time 

## 2011-11-29 DIAGNOSIS — M25519 Pain in unspecified shoulder: Secondary | ICD-10-CM | POA: Diagnosis not present
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
## 2011-12-02 DIAGNOSIS — E119 Type 2 diabetes mellitus without complications: Secondary | ICD-10-CM | POA: Diagnosis not present

## 2011-12-02 DIAGNOSIS — E669 Obesity, unspecified: Secondary | ICD-10-CM | POA: Diagnosis not present

## 2011-12-06 DIAGNOSIS — M79609 Pain in unspecified limb: Secondary | ICD-10-CM | POA: Diagnosis not present

## 2011-12-07 DIAGNOSIS — K219 Gastro-esophageal reflux disease without esophagitis: Secondary | ICD-10-CM | POA: Diagnosis not present

## 2011-12-07 DIAGNOSIS — R1013 Epigastric pain: Secondary | ICD-10-CM | POA: Diagnosis not present

## 2011-12-07 DIAGNOSIS — Z1211 Encounter for screening for malignant neoplasm of colon: Secondary | ICD-10-CM | POA: Diagnosis not present

## 2011-12-07 DIAGNOSIS — R7401 Elevation of levels of liver transaminase levels: Secondary | ICD-10-CM | POA: Diagnosis not present

## 2011-12-07 DIAGNOSIS — K5900 Constipation, unspecified: Secondary | ICD-10-CM | POA: Diagnosis not present

## 2011-12-09 ENCOUNTER — Other Ambulatory Visit (HOSPITAL_COMMUNITY): Payer: Self-pay | Admitting: Gynecology

## 2011-12-09 DIAGNOSIS — Z1231 Encounter for screening mammogram for malignant neoplasm of breast: Secondary | ICD-10-CM

## 2011-12-13 DIAGNOSIS — S838X9A Sprain of other specified parts of unspecified knee, initial encounter: Secondary | ICD-10-CM | POA: Diagnosis not present

## 2012-01-07 DIAGNOSIS — I739 Peripheral vascular disease, unspecified: Secondary | ICD-10-CM | POA: Diagnosis not present

## 2012-01-10 DIAGNOSIS — S838X9A Sprain of other specified parts of unspecified knee, initial encounter: Secondary | ICD-10-CM | POA: Diagnosis not present

## 2012-01-12 ENCOUNTER — Ambulatory Visit (HOSPITAL_COMMUNITY)
Admission: RE | Admit: 2012-01-12 | Discharge: 2012-01-12 | Disposition: A | Discharge status: Home / Self Care | Payer: Medicare Other | Source: Ambulatory Visit | Attending: Gynecology | Admitting: Gynecology

## 2012-01-12 DIAGNOSIS — Z1231 Encounter for screening mammogram for malignant neoplasm of breast: Secondary | ICD-10-CM

## 2012-01-21 DIAGNOSIS — E669 Obesity, unspecified: Secondary | ICD-10-CM | POA: Diagnosis not present

## 2012-01-21 DIAGNOSIS — E119 Type 2 diabetes mellitus without complications: Secondary | ICD-10-CM | POA: Diagnosis not present

## 2012-01-30 ENCOUNTER — Emergency Department (HOSPITAL_COMMUNITY)
Admission: EM | Admit: 2012-01-30 | Discharge: 2012-01-31 | Disposition: A | Discharge status: Home / Self Care | Payer: Medicare Other | Attending: Emergency Medicine | Admitting: Emergency Medicine

## 2012-01-30 ENCOUNTER — Encounter (HOSPITAL_COMMUNITY): Payer: Self-pay | Admitting: Emergency Medicine

## 2012-01-30 DIAGNOSIS — Z85118 Personal history of other malignant neoplasm of bronchus and lung: Secondary | ICD-10-CM | POA: Insufficient documentation

## 2012-01-30 DIAGNOSIS — R110 Nausea: Secondary | ICD-10-CM | POA: Insufficient documentation

## 2012-01-30 DIAGNOSIS — R51 Headache: Secondary | ICD-10-CM | POA: Diagnosis not present

## 2012-01-30 DIAGNOSIS — E119 Type 2 diabetes mellitus without complications: Secondary | ICD-10-CM | POA: Insufficient documentation

## 2012-01-30 DIAGNOSIS — Z7982 Long term (current) use of aspirin: Secondary | ICD-10-CM | POA: Insufficient documentation

## 2012-01-30 DIAGNOSIS — I10 Essential (primary) hypertension: Secondary | ICD-10-CM | POA: Insufficient documentation

## 2012-01-30 DIAGNOSIS — Z794 Long term (current) use of insulin: Secondary | ICD-10-CM | POA: Insufficient documentation

## 2012-01-30 DIAGNOSIS — H9209 Otalgia, unspecified ear: Secondary | ICD-10-CM | POA: Insufficient documentation

## 2012-01-30 DIAGNOSIS — J3489 Other specified disorders of nose and nasal sinuses: Secondary | ICD-10-CM | POA: Insufficient documentation

## 2012-01-30 DIAGNOSIS — Z79899 Other long term (current) drug therapy: Secondary | ICD-10-CM | POA: Insufficient documentation

## 2012-01-30 DIAGNOSIS — E7800 Pure hypercholesterolemia, unspecified: Secondary | ICD-10-CM | POA: Insufficient documentation

## 2012-01-30 NOTE — ED Notes (Signed)
Pt alert, nad, c/o h/a, onset was several days ago, resp even unlabored, skin pwd, speech clear, ambulates to triage ,steady gait noted, denies n/v

## 2012-01-31 ENCOUNTER — Emergency Department (HOSPITAL_COMMUNITY): Payer: Medicare Other

## 2012-01-31 DIAGNOSIS — H9209 Otalgia, unspecified ear: Secondary | ICD-10-CM | POA: Diagnosis not present

## 2012-01-31 DIAGNOSIS — Z79899 Other long term (current) drug therapy: Secondary | ICD-10-CM | POA: Diagnosis not present

## 2012-01-31 DIAGNOSIS — I10 Essential (primary) hypertension: Secondary | ICD-10-CM | POA: Diagnosis not present

## 2012-01-31 DIAGNOSIS — E7800 Pure hypercholesterolemia, unspecified: Secondary | ICD-10-CM | POA: Diagnosis not present

## 2012-01-31 DIAGNOSIS — R51 Headache: Secondary | ICD-10-CM | POA: Diagnosis not present

## 2012-01-31 DIAGNOSIS — J3489 Other specified disorders of nose and nasal sinuses: Secondary | ICD-10-CM | POA: Diagnosis not present

## 2012-01-31 DIAGNOSIS — Z794 Long term (current) use of insulin: Secondary | ICD-10-CM | POA: Diagnosis not present

## 2012-01-31 DIAGNOSIS — E119 Type 2 diabetes mellitus without complications: Secondary | ICD-10-CM | POA: Diagnosis not present

## 2012-01-31 DIAGNOSIS — Z7982 Long term (current) use of aspirin: Secondary | ICD-10-CM | POA: Diagnosis not present

## 2012-01-31 DIAGNOSIS — Z85118 Personal history of other malignant neoplasm of bronchus and lung: Secondary | ICD-10-CM | POA: Diagnosis not present

## 2012-01-31 DIAGNOSIS — R110 Nausea: Secondary | ICD-10-CM | POA: Diagnosis not present

## 2012-01-31 LAB — GLUCOSE, CAPILLARY: Glucose-Capillary: 175 mg/dL — ABNORMAL HIGH (ref 70–99)

## 2012-01-31 MED ORDER — HYDROCODONE-ACETAMINOPHEN 5-325 MG PO TABS
1.0000 | ORAL_TABLET | Freq: Once | ORAL | Status: AC
  Administered 2012-01-31: 1 via ORAL
  Filled 2012-01-31: qty 1

## 2012-01-31 MED ORDER — PROCHLORPERAZINE EDISYLATE 5 MG/ML IJ SOLN
10.0000 mg | Freq: Four times a day (QID) | INTRAMUSCULAR | Status: DC | PRN
  Administered 2012-01-31: 10 mg via INTRAMUSCULAR
  Filled 2012-01-31: qty 2

## 2012-01-31 MED ORDER — HYDROCODONE-ACETAMINOPHEN 5-325 MG PO TABS: 1.0000 | ORAL_TABLET | Freq: Four times a day (QID) | ORAL | Status: AC | PRN

## 2012-01-31 MED ORDER — KETOROLAC TROMETHAMINE 30 MG/ML IJ SOLN
30.0000 mg | Freq: Once | INTRAMUSCULAR | Status: AC
  Administered 2012-01-31: 30 mg via INTRAMUSCULAR
  Filled 2012-01-31: qty 1

## 2012-01-31 NOTE — Discharge Instructions (Signed)
General Headache, Without Cause A general headache has no specific cause. These headaches are not life-threatening. They will not lead to other types of headaches. HOME CARE   Make and keep follow-up visits with your doctor.   Only take medicine as told by your doctor.   Try to relax, get a massage, or use your thoughts to control your body (biofeedback).   Apply cold or heat to the head and neck. Apply 3 or 4 times a day or as needed.  Finding out the results of your test Ask when your test results will be ready. Make sure you get your test results. GET HELP RIGHT AWAY IF:   You have problems with medicine.   Your medicine does not help relieve pain.   Your headache changes or becomes worse.   You feel sick to your stomach (nauseous) or throw up (vomit).   You have a temperature by mouth above 102 F (38.9 C), not controlled by medicine.   Your have a stiff neck.   You have vision loss.   You have muscle weakness.   You lose control of your muscles.   You lose balance or have trouble walking.   You feel like you are going to pass out (faint).  MAKE SURE YOU:   Understand these instructions.   Will watch this condition.   Will get help right away if you are not doing well or get worse.  Document Released: 06/22/2008 Document Revised: 09/02/2011 Document Reviewed: 06/22/2008 [facility] Patient Information 2012 [place], [place]. Please make an appointment to follow up Dr. Andrey Campanile this week

## 2012-01-31 NOTE — ED Provider Notes (Signed)
History     CSN: [id number]  Arrival date & time 01/30/12  2153   First MD Initiated Contact with Patient 01/30/12 2355      Chief Complaint  Patient presents with  . Migraine    (Consider location/radiation/quality/duration/timing/severity/associated sxs/prior treatment) HPI Comments: Patient says that for the last in formalin she's been having headaches that last for one to 2 hours and mostly concentrated on the left side occasionally she will have some nausea but not persistently she has no history of migraine headaches she has seen her primary care doctor but has not discussed this with him she also has poorly controlled diabetes with blood sugars running in the 200-300  Patient is a 61 y.o. female presenting with migraine. The history is provided by the patient.  Migraine This is a recurrent problem. The current episode started today. The problem occurs intermittently. The problem has been unchanged. Associated symptoms include headaches. Pertinent negatives include no chills, coughing, fever, nausea, numbness, rash, sore throat, swollen glands or weakness. The symptoms are aggravated by nothing. She has tried nothing for the symptoms.    Past Medical History  Diagnosis Date  . Hypertension   . Diabetes mellitus   . Hypercholesteremia   . Cancer   . Lung cancer     Past Surgical History  Procedure Date  . Abdominal hysterectomy   . Cholecystectomy   . Left knee replacement   . Lung surgery     Family History  Problem Relation Age of Onset  . Hypertension Mother   . Stroke Father     History  Substance Use Topics  . Smoking status: Former Smoker -- 1.0 packs/day for 20 years    Types: Cigarettes  . Smokeless tobacco: Not on file  . Alcohol Use: No    OB History    Grav Para Term Preterm Abortions TAB SAB Ect Mult Living                  Review of Systems  Constitutional: Negative for fever and chills.  HENT: Positive for ear pain and sinus pressure.  Negative for sore throat, rhinorrhea and ear discharge.   Eyes: Negative for photophobia and visual disturbance.  Respiratory: Negative for cough.   Gastrointestinal: Negative for nausea.  Genitourinary: Negative for dysuria.  Skin: Negative for rash.  Neurological: Positive for headaches. Negative for dizziness, weakness and numbness.    Allergies  Levofloxacin and Morphine and related  Home Medications   Current Outpatient Rx  Name Route Sig Dispense Refill  . ASPIRIN 81 MG PO TABS Oral Take 81 mg by mouth daily.      Marland Kitchen VITAMIN D PO Oral Take 2,000 Units by mouth daily.      Marland Kitchen CLOPIDOGREL BISULFATE 75 MG PO TABS Oral Take 75 mg by mouth daily.      . IBUPROFEN 200 MG PO TABS Oral Take 600 mg by mouth every 6 (six) hours as needed. For pain    . INSULIN ISOPHANE HUMAN 100 UNIT/ML [place] SUSP Subcutaneous Inject 5-10 Units into the skin at bedtime. Sliding scale    . INSULIN REGULAR HUMAN 100 UNIT/ML [place] SOLN Subcutaneous Inject 5 Units into the skin 3 (three) times daily before meals.    Marland Kitchen LISINOPRIL-HYDROCHLOROTHIAZIDE 20-25 MG PO TABS Oral Take 1 tablet by mouth daily.      Marland Kitchen METOCLOPRAMIDE HCL 10 MG PO TABS Oral Take 10 mg by mouth 4 (four) times daily as needed. Nausea    . PANTOPRAZOLE  SODIUM 40 MG PO TBEC Oral Take 40 mg by mouth daily.    Marland Kitchen HYDROCODONE-ACETAMINOPHEN 5-325 MG PO TABS Oral Take 1 tablet by mouth every 6 (six) hours as needed for pain. 10 tablet 0    BP 171/55  Pulse 66  Temp 98 F (36.7 C)  Resp 16  SpO2 98%  Physical Exam  Constitutional: She is oriented to person, place, and time. She appears well-developed and well-nourished.  HENT:  Head: Normocephalic.  Right Ear: External ear normal.  Left Ear: External ear normal.  Nose: Left sinus exhibits maxillary sinus tenderness and frontal sinus tenderness.  Mouth/Throat: Oropharynx is clear and moist. No oropharyngeal exudate.  Eyes: Pupils are equal, round, and reactive to light.  Neck: Normal range of  motion.  Cardiovascular: Normal rate.   Pulmonary/Chest: Effort normal.  Abdominal: Soft.  Musculoskeletal: Normal range of motion.  Neurological: She is alert and oriented to person, place, and time. No cranial nerve deficit.  Skin: Skin is warm. No rash noted. No erythema.    ED Course  Procedures (including critical care time)  Labs Reviewed  GLUCOSE, CAPILLARY - Abnormal; Notable for the following:    Glucose-Capillary 175 (*)    All other components within normal limits   Ct Head Wo Contrast  01/31/2012  *RADIOLOGY REPORT*  Clinical Data: Headache for several days; history of diabetes and lung cancer.  CT HEAD WITHOUT CONTRAST  Technique:  Contiguous axial images were obtained from the base of the skull through the vertex without contrast.  Comparison: CT of the head performed 10/08/2010  Findings: There is no evidence of acute infarction, mass lesion, or intra- or extra-axial hemorrhage on CT.  The posterior fossa, including the cerebellum, brainstem and fourth ventricle, is within normal limits.  The third and lateral ventricles, and basal ganglia are unremarkable in appearance.  The cerebral hemispheres are symmetric in appearance, with normal gray- white differentiation.  No mass effect or midline shift is seen.  There is no evidence of fracture; visualized osseous structures are unremarkable in appearance.  The orbits are within normal limits. The paranasal sinuses and mastoid air cells are well-aerated.  No significant soft tissue abnormalities are seen.  IMPRESSION: Unremarkable noncontrast CT of the head.  Original Report Authenticated By: Tonia Ghent, M.D.     1. Headache     Patient did not give any relief from Toradol or Compazine after she received one tablet of Vicodin her headache resolved to a 1/10 her head CT is negative for any metastatic pathology I recommend that she followup with Dr. Andrey Campanile this week for further evaluation of headaches that have been recurrent for  the past 2-3 months  MDM   After exam I think this patient is having left sinusitis with intermittent episodes of congestion causing her discomfort she also describes a paresthesia of the scalp intermittently with pins and needle limits sensation this is most likely neuropathy due to patient's poorly controlled diabetes        Arman Filter, NP 01/31/12 1610  Arman Filter, NP 01/31/12 [phone]

## 2012-01-31 NOTE — ED Provider Notes (Signed)
Medical screening examination/treatment/procedure(s) were performed by non-physician practitioner and as supervising physician I was immediately available for consultation/collaboration.   Flannery Cavallero, MD 01/31/12 0511 

## 2012-01-31 NOTE — ED Notes (Signed)
Patient transported to CT 

## 2012-02-16 ENCOUNTER — Encounter (HOSPITAL_COMMUNITY): Payer: Self-pay | Admitting: *Deleted

## 2012-02-16 ENCOUNTER — Emergency Department (HOSPITAL_COMMUNITY)
Admission: EM | Admit: 2012-02-16 | Discharge: 2012-02-16 | Disposition: A | Discharge status: Home / Self Care | Payer: Medicare Other | Attending: Emergency Medicine | Admitting: Emergency Medicine

## 2012-02-16 DIAGNOSIS — R110 Nausea: Secondary | ICD-10-CM | POA: Insufficient documentation

## 2012-02-16 DIAGNOSIS — Z7982 Long term (current) use of aspirin: Secondary | ICD-10-CM | POA: Insufficient documentation

## 2012-02-16 DIAGNOSIS — E119 Type 2 diabetes mellitus without complications: Secondary | ICD-10-CM | POA: Insufficient documentation

## 2012-02-16 DIAGNOSIS — Z794 Long term (current) use of insulin: Secondary | ICD-10-CM | POA: Insufficient documentation

## 2012-02-16 DIAGNOSIS — Z85118 Personal history of other malignant neoplasm of bronchus and lung: Secondary | ICD-10-CM | POA: Insufficient documentation

## 2012-02-16 DIAGNOSIS — E7800 Pure hypercholesterolemia, unspecified: Secondary | ICD-10-CM | POA: Insufficient documentation

## 2012-02-16 DIAGNOSIS — H8110 Benign paroxysmal vertigo, unspecified ear: Secondary | ICD-10-CM | POA: Diagnosis not present

## 2012-02-16 DIAGNOSIS — Z79899 Other long term (current) drug therapy: Secondary | ICD-10-CM | POA: Insufficient documentation

## 2012-02-16 DIAGNOSIS — I10 Essential (primary) hypertension: Secondary | ICD-10-CM | POA: Diagnosis not present

## 2012-02-16 LAB — DIFFERENTIAL
Basophils Absolute: 0 10*3/uL (ref 0.0–0.1)
Eosinophils Relative: 1 % (ref 0–5)
Lymphocytes Relative: 20 % (ref 12–46)
Lymphs Abs: 1.3 10*3/uL (ref 0.7–4.0)
Neutro Abs: 4.8 10*3/uL (ref 1.7–7.7)

## 2012-02-16 LAB — BASIC METABOLIC PANEL
CO2: 28 mEq/L (ref 19–32)
Chloride: 97 mEq/L (ref 96–112)
Glucose, Bld: 215 mg/dL — ABNORMAL HIGH (ref 70–99)
Potassium: 3.5 mEq/L (ref 3.5–5.1)
Sodium: 136 mEq/L (ref 135–145)

## 2012-02-16 LAB — URINALYSIS, ROUTINE W REFLEX MICROSCOPIC
Glucose, UA: NEGATIVE mg/dL
Hgb urine dipstick: NEGATIVE
Leukocytes, UA: NEGATIVE
Protein, ur: NEGATIVE mg/dL
Specific Gravity, Urine: 1.013 (ref 1.005–1.030)
pH: 6.5 (ref 5.0–8.0)

## 2012-02-16 LAB — CBC
HCT: 36.5 % (ref 36.0–46.0)
MCV: 83.3 fL (ref 78.0–100.0)
Platelets: 219 10*3/uL (ref 150–400)
RBC: 4.38 MIL/uL (ref 3.87–5.11)
RDW: 12.5 % (ref 11.5–15.5)
WBC: 6.6 10*3/uL (ref 4.0–10.5)

## 2012-02-16 MED ORDER — MECLIZINE HCL 25 MG PO TABS: 25.0000 mg | ORAL_TABLET | Freq: Three times a day (TID) | ORAL | Status: AC | PRN

## 2012-02-16 MED ORDER — MECLIZINE HCL 25 MG PO TABS
25.0000 mg | ORAL_TABLET | Freq: Once | ORAL | Status: AC
  Administered 2012-02-16: 25 mg via ORAL
  Filled 2012-02-16: qty 1

## 2012-02-16 NOTE — ED Notes (Signed)
Pt presenting to ed with c/o dizziness pt states onset "for a while' pt states she was seen at the urgent care and was told she had a migraine. Pt states she hasn't been able to follow up with her doctor. Pt with no slurred speech or facial droop noted at this time. Pt denies shortness of breath and chest pain at this time. Pt is alert and oriented at this time.

## 2012-02-16 NOTE — ED Provider Notes (Addendum)
History     CSN: [id number]  Arrival date & time 02/16/12  0905   First MD Initiated Contact with Patient 02/16/12 1018      Chief Complaint  Patient presents with  . Dizziness    (Consider location/radiation/quality/duration/timing/severity/associated sxs/prior treatment) HPI Comments: Patient presents with a complaint of dizziness.  She states that it's been intermittent in nature over the last several months.  It was worse this morning which is why she presents today.  She notes that it seems to be associated with changing positions are specifically when she first seems to get up from lying down or sitting.  It occurred this morning when she was walking away from the bathroom and had to go sit on her bed.  She has never lost consciousness.  She does not feel as though she will black out during any of these episodes.  She has no focal weakness or changes in speech.  No fevers.  She has some occasional nausea but no vomiting.  No chest pain, shortness of breath or palpitations.  Patient denies a room spinning sensation but does note that she sometimes feels slightly off balance.  She walks with a cane at baseline.  Patient is a 61 y.o. female presenting with neurologic complaint. The history is provided by the patient. No language interpreter was used.  Neurologic Problem The primary symptoms include dizziness. Primary symptoms do not include headaches, fever, nausea or vomiting.  Dizziness does not occur with nausea or vomiting.    Past Medical History  Diagnosis Date  . Hypertension   . Diabetes mellitus   . Hypercholesteremia   . Cancer   . Lung cancer     Past Surgical History  Procedure Date  . Abdominal hysterectomy   . Cholecystectomy   . Left knee replacement   . Lung surgery     Family History  Problem Relation Age of Onset  . Hypertension Mother   . Stroke Father     History  Substance Use Topics  . Smoking status: Former Smoker -- 1.0 packs/day for 20 years     Types: Cigarettes  . Smokeless tobacco: Not on file  . Alcohol Use: No    OB History    Grav Para Term Preterm Abortions TAB SAB Ect Mult Living                  Review of Systems  Constitutional: Negative.  Negative for fever and chills.  HENT: Negative.   Eyes: Negative.  Negative for discharge and redness.  Respiratory: Negative.  Negative for cough and shortness of breath.   Cardiovascular: Negative.  Negative for chest pain.  Gastrointestinal: Negative.  Negative for nausea, vomiting, abdominal pain and diarrhea.  Genitourinary: Negative.  Negative for dysuria and vaginal discharge.  Musculoskeletal: Negative.  Negative for back pain.  Skin: Negative.  Negative for color change and rash.  Neurological: Positive for dizziness and light-headedness. Negative for syncope and headaches.  Hematological: Negative.  Negative for adenopathy.  Psychiatric/Behavioral: Negative.  Negative for confusion.  All other systems reviewed and are negative.    Allergies  Levofloxacin and Morphine and related  Home Medications   Current Outpatient Rx  Name Route Sig Dispense Refill  . ASPIRIN 81 MG PO TABS Oral Take 81 mg by mouth daily.      Marland Kitchen VITAMIN D PO Oral Take 2,000 Units by mouth daily.      Marland Kitchen CLOPIDOGREL BISULFATE 75 MG PO TABS Oral Take 75 mg by  mouth daily.      . IBUPROFEN 200 MG PO TABS Oral Take 600 mg by mouth every 6 (six) hours as needed. For pain    . INSULIN ISOPHANE HUMAN 100 UNIT/ML [place] SUSP Subcutaneous Inject 5-10 Units into the skin at bedtime. Sliding scale    . INSULIN REGULAR HUMAN 100 UNIT/ML IJ SOLN Subcutaneous Inject 5 Units into the skin 3 (three) times daily before meals.    Marland Kitchen LISINOPRIL-HYDROCHLOROTHIAZIDE 20-25 MG PO TABS Oral Take 1 tablet by mouth daily.      Marland Kitchen METOCLOPRAMIDE HCL 10 MG PO TABS Oral Take 10 mg by mouth 4 (four) times daily as needed. Nausea    . PANTOPRAZOLE SODIUM 40 MG PO TBEC Oral Take 40 mg by mouth daily.    Marland Kitchen SIMVASTATIN 40 MG  PO TABS Oral Take 40 mg by mouth every evening.      BP 146/72  Pulse 75  Temp(Src) 98.2 F (36.8 C) (Oral)  Resp 17  SpO2 100%  Physical Exam  Nursing note and vitals reviewed. Constitutional: She is oriented to person, place, and time. She appears well-developed and well-nourished.  Non-toxic appearance. She does not have a sickly appearance.  HENT:  Head: Normocephalic and atraumatic.  Eyes: Conjunctivae, EOM and lids are normal. Pupils are equal, round, and reactive to light. No scleral icterus.  Neck: Trachea normal and normal range of motion. Neck supple.  Cardiovascular: Normal rate, regular rhythm and normal heart sounds.   Pulmonary/Chest: Effort normal and breath sounds normal.  Abdominal: Soft. Normal appearance. There is no tenderness. There is no rebound, no guarding and no CVA tenderness.  Musculoskeletal: Normal range of motion.  Neurological: She is alert and oriented to person, place, and time. She has normal strength.       Cranial nerves II through XII are intact.  Visual fields are intact.  Extraocular eye movements are intact but do cause for the dizzy sensation.  No pronator drift.  Normal finger to nose testing bilaterally.  Symmetric strength in her upper extremities and lower extremities.  Patient can ambulate with her cane with no assistance.  Skin: Skin is warm, dry and intact. No rash noted.  Psychiatric: She has a normal mood and affect. Her behavior is normal. Judgment and thought content normal.    ED Course  Procedures (including critical care time)   Labs Reviewed  CBC  DIFFERENTIAL  BASIC METABOLIC PANEL  URINALYSIS, ROUTINE W REFLEX MICROSCOPIC   No results found.   No diagnosis found.   Date: 02/16/2012  Rate: 56  Rhythm: sinus bradycardia  QRS Axis: normal  Intervals: normal  ST/T Wave abnormalities: normal  Conduction Disutrbances:nonspecific intraventricular conduction delay  Narrative Interpretation:   Old EKG Reviewed:  unchanged from 11/22/2011    MDM  Patient presents with symptoms that appear him to be more consistent with possible vertigo given that they're quite positional in nature.  Patient has no neurologic deficits to suggest TIA or stroke.  I will check orthostatic vital signs as she could potentially have dehydration as a cause for her symptoms but this also seems less likely since her symptoms have been intermittent for several months.  Patient has no chest pain, shortness of breath or palpitations to specifically suggest there is an acute cardiac cause but given the patient's history of hypertension, diabetes and hypercholesterolemia I will check an EKG for possible dysrhythmias.  I will give the patient a dose of meclizine to see if this assists with her symptoms  as well.        Nat Christen, MD 02/16/12 1042  Nat Christen, MD 02/16/12 [phone]

## 2012-02-16 NOTE — ED Notes (Signed)
Pt reports dizziness that started this am-pt also reports h/a.  Pt denies one sided weakness, slurred speech at this time.  Pt is A&Ox 4.  Pt's family at bedside reports that when pt called her this am to report that she is dizzy that her head was spinning.

## 2012-02-16 NOTE — Discharge Instructions (Signed)

## 2012-02-28 DIAGNOSIS — E785 Hyperlipidemia, unspecified: Secondary | ICD-10-CM | POA: Diagnosis not present

## 2012-02-28 DIAGNOSIS — E119 Type 2 diabetes mellitus without complications: Secondary | ICD-10-CM | POA: Diagnosis not present

## 2012-02-28 DIAGNOSIS — I10 Essential (primary) hypertension: Secondary | ICD-10-CM | POA: Diagnosis not present

## 2012-04-03 DIAGNOSIS — D128 Benign neoplasm of rectum: Secondary | ICD-10-CM | POA: Diagnosis not present

## 2012-04-03 DIAGNOSIS — K5900 Constipation, unspecified: Secondary | ICD-10-CM | POA: Diagnosis not present

## 2012-04-03 DIAGNOSIS — K6289 Other specified diseases of anus and rectum: Secondary | ICD-10-CM | POA: Diagnosis not present

## 2012-04-03 DIAGNOSIS — K219 Gastro-esophageal reflux disease without esophagitis: Secondary | ICD-10-CM | POA: Diagnosis not present

## 2012-04-03 DIAGNOSIS — R1013 Epigastric pain: Secondary | ICD-10-CM | POA: Diagnosis not present

## 2012-04-03 DIAGNOSIS — Z1211 Encounter for screening for malignant neoplasm of colon: Secondary | ICD-10-CM | POA: Diagnosis not present

## 2012-04-03 DIAGNOSIS — D129 Benign neoplasm of anus and anal canal: Secondary | ICD-10-CM | POA: Diagnosis not present

## 2012-04-04 DIAGNOSIS — Z961 Presence of intraocular lens: Secondary | ICD-10-CM | POA: Diagnosis not present

## 2012-04-04 DIAGNOSIS — E11319 Type 2 diabetes mellitus with unspecified diabetic retinopathy without macular edema: Secondary | ICD-10-CM | POA: Diagnosis not present

## 2012-04-04 DIAGNOSIS — E1139 Type 2 diabetes mellitus with other diabetic ophthalmic complication: Secondary | ICD-10-CM | POA: Diagnosis not present

## 2012-04-06 ENCOUNTER — Encounter (HOSPITAL_COMMUNITY): Payer: Self-pay

## 2012-04-06 ENCOUNTER — Inpatient Hospital Stay (HOSPITAL_COMMUNITY)
Admission: EM | Admit: 2012-04-06 | Discharge: 2012-04-08 | DRG: 281 | Disposition: A | Discharge status: Home / Self Care | Payer: Medicare Other | Attending: Internal Medicine | Admitting: Internal Medicine

## 2012-04-06 DIAGNOSIS — I2510 Atherosclerotic heart disease of native coronary artery without angina pectoris: Secondary | ICD-10-CM | POA: Diagnosis present

## 2012-04-06 DIAGNOSIS — R079 Chest pain, unspecified: Secondary | ICD-10-CM

## 2012-04-06 DIAGNOSIS — Z7982 Long term (current) use of aspirin: Secondary | ICD-10-CM

## 2012-04-06 DIAGNOSIS — Z7902 Long term (current) use of antithrombotics/antiplatelets: Secondary | ICD-10-CM

## 2012-04-06 DIAGNOSIS — C3492 Malignant neoplasm of unspecified part of left bronchus or lung: Secondary | ICD-10-CM | POA: Diagnosis present

## 2012-04-06 DIAGNOSIS — I214 Non-ST elevation (NSTEMI) myocardial infarction: Principal | ICD-10-CM | POA: Diagnosis present

## 2012-04-06 DIAGNOSIS — Z96659 Presence of unspecified artificial knee joint: Secondary | ICD-10-CM

## 2012-04-06 DIAGNOSIS — C3490 Malignant neoplasm of unspecified part of unspecified bronchus or lung: Secondary | ICD-10-CM

## 2012-04-06 DIAGNOSIS — R002 Palpitations: Secondary | ICD-10-CM | POA: Diagnosis not present

## 2012-04-06 DIAGNOSIS — Z85118 Personal history of other malignant neoplasm of bronchus and lung: Secondary | ICD-10-CM

## 2012-04-06 DIAGNOSIS — I255 Ischemic cardiomyopathy: Secondary | ICD-10-CM | POA: Diagnosis present

## 2012-04-06 DIAGNOSIS — I2589 Other forms of chronic ischemic heart disease: Secondary | ICD-10-CM | POA: Diagnosis present

## 2012-04-06 DIAGNOSIS — I4891 Unspecified atrial fibrillation: Secondary | ICD-10-CM | POA: Diagnosis present

## 2012-04-06 DIAGNOSIS — I739 Peripheral vascular disease, unspecified: Secondary | ICD-10-CM | POA: Diagnosis present

## 2012-04-06 DIAGNOSIS — E119 Type 2 diabetes mellitus without complications: Secondary | ICD-10-CM | POA: Diagnosis present

## 2012-04-06 DIAGNOSIS — IMO0001 Reserved for inherently not codable concepts without codable children: Secondary | ICD-10-CM | POA: Diagnosis present

## 2012-04-06 DIAGNOSIS — Z6841 Body Mass Index (BMI) 40.0 and over, adult: Secondary | ICD-10-CM

## 2012-04-06 DIAGNOSIS — J984 Other disorders of lung: Secondary | ICD-10-CM | POA: Diagnosis not present

## 2012-04-06 DIAGNOSIS — I200 Unstable angina: Secondary | ICD-10-CM | POA: Diagnosis present

## 2012-04-06 DIAGNOSIS — Z87891 Personal history of nicotine dependence: Secondary | ICD-10-CM

## 2012-04-06 DIAGNOSIS — Z794 Long term (current) use of insulin: Secondary | ICD-10-CM

## 2012-04-06 DIAGNOSIS — E876 Hypokalemia: Secondary | ICD-10-CM | POA: Diagnosis present

## 2012-04-06 DIAGNOSIS — I480 Paroxysmal atrial fibrillation: Secondary | ICD-10-CM | POA: Diagnosis present

## 2012-04-06 DIAGNOSIS — I10 Essential (primary) hypertension: Secondary | ICD-10-CM | POA: Diagnosis present

## 2012-04-06 HISTORY — DX: Sleep apnea, unspecified: G47.30

## 2012-04-06 HISTORY — DX: Gastro-esophageal reflux disease without esophagitis: K21.9

## 2012-04-06 HISTORY — DX: Peripheral vascular disease, unspecified: I73.9

## 2012-04-06 HISTORY — DX: Personal history of other diseases of the digestive system: Z87.19

## 2012-04-06 HISTORY — DX: Unspecified osteoarthritis, unspecified site: M19.90

## 2012-04-06 MED ORDER — DILTIAZEM HCL 25 MG/5ML IV SOLN
20.0000 mg | Freq: Once | INTRAVENOUS | Status: AC
  Administered 2012-04-07: 20 mg via INTRAVENOUS
  Filled 2012-04-06: qty 5

## 2012-04-06 MED ORDER — SODIUM CHLORIDE 0.9 % IV SOLN
Freq: Once | INTRAVENOUS | Status: AC
  Administered 2012-04-07: 10 mL via INTRAVENOUS

## 2012-04-06 NOTE — ED Provider Notes (Signed)
History     CSN: [id number]  Arrival date & time 04/06/12  2302   First MD Initiated Contact with Patient 04/06/12 2336      Chief Complaint  Patient presents with  . Chest Pain    (Consider location/radiation/quality/duration/timing/severity/associated sxs/prior treatment) HPI This is a 61 year old black female whose been having palpitations for several months and episodes of chest pain which she attributed to indigestion. She was seen by her primary care physician today and was told that her EKG was abnormal and that she would be scheduled for a cardiac cath. About 9:30 PM today she had the sudden onset of a rapid heartbeat and chest pressure. She rated the chest pressure to be about 8/10. There was no radiation. There was no associated shortness of breath or nausea but she did say she was sweaty. EMS was called and they gave her 324 mg of aspirin and one sublingual nitroglycerin. She states the nitroglycerin relieves the pain. EMS reports her initial rhythm was atrial fibrillation with a rate of 145. Her nurse states that she appears to have been going in and out of atrial fibrillation with RVR since arrival.  Past Medical History  Diagnosis Date  . Hypertension   . Diabetes mellitus   . Hypercholesteremia   . Cancer   . Lung cancer     Past Surgical History  Procedure Date  . Abdominal hysterectomy   . Cholecystectomy   . Left knee replacement   . Lung surgery     Family History  Problem Relation Age of Onset  . Hypertension Mother   . Stroke Father     History  Substance Use Topics  . Smoking status: Former Smoker -- 1.0 packs/day for 20 years    Types: Cigarettes  . Smokeless tobacco: Not on file  . Alcohol Use: No    OB History    Grav Para Term Preterm Abortions TAB SAB Ect Mult Living                  Review of Systems  All other systems reviewed and are negative.    Allergies  Levofloxacin and Morphine and related  Home Medications   Current  Outpatient Rx  Name Route Sig Dispense Refill  . ASPIRIN 81 MG PO TABS Oral Take 81 mg by mouth daily.      Marland Kitchen VITAMIN D PO Oral Take 2,000 Units by mouth daily.      Marland Kitchen CLOPIDOGREL BISULFATE 75 MG PO TABS Oral Take 75 mg by mouth daily.      . INSULIN ISOPHANE HUMAN 100 UNIT/ML [place] SUSP Subcutaneous Inject 5-10 Units into the skin at bedtime. Sliding scale    . INSULIN REGULAR HUMAN 100 UNIT/ML IJ SOLN Subcutaneous Inject 5 Units into the skin 3 (three) times daily before meals.    Marland Kitchen LISINOPRIL-HYDROCHLOROTHIAZIDE 20-25 MG PO TABS Oral Take 1 tablet by mouth daily.      Marland Kitchen METOCLOPRAMIDE HCL 10 MG PO TABS Oral Take 10 mg by mouth 4 (four) times daily - after meals and at bedtime. Nausea    . PANTOPRAZOLE SODIUM 40 MG PO TBEC Oral Take 40 mg by mouth daily.    Marland Kitchen SIMVASTATIN 40 MG PO TABS Oral Take 40 mg by mouth every evening.      BP 118/59  Temp 98.6 F (37 C) (Oral)  Resp 24  SpO2 98%  Physical Exam General: Well-developed, well-nourished female in no acute distress; appearance consistent with age of record HENT: normocephalic,  atraumatic Eyes: pupils equal round and reactive to light; extraocular muscles intact Neck: supple Heart: Periods of atrial fibrillation with rapid ventricular response alternating with sinus rhythm on monitor Lungs: clear to auscultation bilaterally Abdomen: soft; nondistended Extremities: No deformity; full range of motion; pulses normal; no edema Neurologic: Awake, alert and oriented; motor function intact in all extremities and symmetric; no facial droop Skin: Warm and dry Psychiatric: Mildly anxious    ED Course  Procedures (including critical care time)     MDM   Nursing notes and vitals signs, including pulse oximetry, reviewed.  Summary of this visit's results, reviewed by myself:  Labs:  Results for orders placed during the hospital encounter of 04/06/12  CBC WITH DIFFERENTIAL      Component Value Range   WBC 7.7  4.0 - 10.5 K/uL    RBC 4.46  3.87 - 5.11 MIL/uL   Hemoglobin 13.0  12.0 - 15.0 g/dL   HCT 40.9  81.1 - 91.4 %   MCV 82.7  78.0 - 100.0 fL   MCH 29.1  26.0 - 34.0 pg   MCHC 35.2  30.0 - 36.0 g/dL   RDW 78.2  95.6 - 21.3 %   Platelets 234  150 - 400 K/uL   Neutrophils Relative 75  43 - 77 %   Neutro Abs 5.8  1.7 - 7.7 K/uL   Lymphocytes Relative 17  12 - 46 %   Lymphs Abs 1.3  0.7 - 4.0 K/uL   Monocytes Relative 7  3 - 12 %   Monocytes Absolute 0.5  0.1 - 1.0 K/uL   Eosinophils Relative 1  0 - 5 %   Eosinophils Absolute 0.1  0.0 - 0.7 K/uL   Basophils Relative 0  0 - 1 %   Basophils Absolute 0.0  0.0 - 0.1 K/uL  POCT I-STAT, CHEM 8      Component Value Range   Sodium 138  135 - 145 mEq/L   Potassium 4.1  3.5 - 5.1 mEq/L   Chloride 100  96 - 112 mEq/L   BUN 16  6 - 23 mg/dL   Creatinine, Ser 0.86  0.50 - 1.10 mg/dL   Glucose, Bld 578 (*) 70 - 99 mg/dL   Calcium, Ion 4.69  6.29 - 1.30 mmol/L   TCO2 28  0 - 100 mmol/L   Hemoglobin 12.9  12.0 - 15.0 g/dL   HCT 52.8  41.3 - 24.4 %  POCT I-STAT TROPONIN I      Component Value Range   Troponin i, poc 0.04  0.00 - 0.08 ng/mL   Comment 3             Imaging Studies: Dg Chest Port 1 View  04/07/2012  *RADIOLOGY REPORT*  Clinical Data: Chest pain  PORTABLE CHEST - 1 VIEW  Comparison: 11/26/2011  Findings: Low lung volumes.  Bilateral central basilar airspace disease left greater than right.  Under aerated lungs.  No pneumothorax.  IMPRESSION: Bilateral airspace disease left greater than right.  Consider pulmonary edema, ARDS, bilateral pneumonia.  Original Report Authenticated By: Donavan Burnet, M.D.       EKG Interpretation:  Date & Time: 04/06/2012 11:13 PM  Rate: 145  Rhythm: sinus tachycardia  QRS Axis: normal  Intervals: normal  ST/T Wave abnormalities: nonspecific ST/T changes  Conduction Disutrbances:nonspecific intraventricular conduction delay  Narrative Interpretation:   Old EKG Reviewed: Previously sinus bradycardia  EKG  Interpretation:  Date & Time: 04/07/2012 12:13 AM  Rate: 88  Rhythm: atrial fibrillation  QRS Axis: normal  Intervals: normal  ST/T Wave abnormalities: T-wave inversions laterally  Conduction Disutrbances:nonspecific intraventricular conduction delay  Narrative Interpretation:   Old EKG Reviewed: Rate is slower     12:15 AM Rate controlled after Cardizem 20 mg IV        Hanley Seamen, MD 04/07/12 1750

## 2012-04-06 NOTE — ED Notes (Signed)
Was at a baseball game and had an acute onset on chest pain/palpitations. Rated 8/10. Described as pressure. Non-radiating. No sob, diaphoresis, N/V, dizziness or headache. Was seen by PCP 1 day ago. ECG unremarkable with 30% blockage of coronary arteries. Was supposed to be called by cardiology tomorrow to schedule cardiac cath. Per EMS, initially in A-fib on monitor. Then converted to ST with PVCs w/ HR 145. BP 156/88. SPO2 100% on 2L. Was given ASA 324mg  and Nitro x 1. Pain decreased and now to 0/10. AAOx4, resp e/u, NAD.

## 2012-04-07 ENCOUNTER — Encounter (HOSPITAL_COMMUNITY)
Admission: EM | Disposition: A | Discharge status: Home / Self Care | Payer: Self-pay | Source: Home / Self Care | Attending: Internal Medicine

## 2012-04-07 ENCOUNTER — Encounter (HOSPITAL_COMMUNITY): Payer: Self-pay | Admitting: Internal Medicine

## 2012-04-07 ENCOUNTER — Emergency Department (HOSPITAL_COMMUNITY): Payer: Medicare Other

## 2012-04-07 DIAGNOSIS — I480 Paroxysmal atrial fibrillation: Secondary | ICD-10-CM

## 2012-04-07 DIAGNOSIS — I739 Peripheral vascular disease, unspecified: Secondary | ICD-10-CM | POA: Diagnosis present

## 2012-04-07 DIAGNOSIS — I200 Unstable angina: Secondary | ICD-10-CM | POA: Diagnosis present

## 2012-04-07 DIAGNOSIS — I10 Essential (primary) hypertension: Secondary | ICD-10-CM

## 2012-04-07 DIAGNOSIS — I214 Non-ST elevation (NSTEMI) myocardial infarction: Secondary | ICD-10-CM | POA: Diagnosis not present

## 2012-04-07 DIAGNOSIS — Z7982 Long term (current) use of aspirin: Secondary | ICD-10-CM | POA: Diagnosis not present

## 2012-04-07 DIAGNOSIS — Z96659 Presence of unspecified artificial knee joint: Secondary | ICD-10-CM | POA: Diagnosis not present

## 2012-04-07 DIAGNOSIS — R079 Chest pain, unspecified: Secondary | ICD-10-CM

## 2012-04-07 DIAGNOSIS — J984 Other disorders of lung: Secondary | ICD-10-CM | POA: Diagnosis not present

## 2012-04-07 DIAGNOSIS — Z87891 Personal history of nicotine dependence: Secondary | ICD-10-CM | POA: Diagnosis not present

## 2012-04-07 DIAGNOSIS — Z85118 Personal history of other malignant neoplasm of bronchus and lung: Secondary | ICD-10-CM | POA: Diagnosis not present

## 2012-04-07 DIAGNOSIS — I4891 Unspecified atrial fibrillation: Secondary | ICD-10-CM | POA: Diagnosis not present

## 2012-04-07 DIAGNOSIS — E119 Type 2 diabetes mellitus without complications: Secondary | ICD-10-CM

## 2012-04-07 DIAGNOSIS — I2510 Atherosclerotic heart disease of native coronary artery without angina pectoris: Secondary | ICD-10-CM | POA: Diagnosis present

## 2012-04-07 DIAGNOSIS — I2589 Other forms of chronic ischemic heart disease: Secondary | ICD-10-CM | POA: Diagnosis present

## 2012-04-07 DIAGNOSIS — C3490 Malignant neoplasm of unspecified part of unspecified bronchus or lung: Secondary | ICD-10-CM

## 2012-04-07 DIAGNOSIS — I4892 Unspecified atrial flutter: Secondary | ICD-10-CM | POA: Diagnosis not present

## 2012-04-07 DIAGNOSIS — Z7902 Long term (current) use of antithrombotics/antiplatelets: Secondary | ICD-10-CM | POA: Diagnosis not present

## 2012-04-07 DIAGNOSIS — Z955 Presence of coronary angioplasty implant and graft: Secondary | ICD-10-CM

## 2012-04-07 DIAGNOSIS — Z6841 Body Mass Index (BMI) 40.0 and over, adult: Secondary | ICD-10-CM | POA: Diagnosis not present

## 2012-04-07 DIAGNOSIS — E876 Hypokalemia: Secondary | ICD-10-CM | POA: Diagnosis present

## 2012-04-07 DIAGNOSIS — Z794 Long term (current) use of insulin: Secondary | ICD-10-CM | POA: Diagnosis not present

## 2012-04-07 HISTORY — PX: CORONARY ANGIOPLASTY WITH STENT PLACEMENT: SHX49

## 2012-04-07 HISTORY — DX: Non-ST elevation (NSTEMI) myocardial infarction: I21.4

## 2012-04-07 HISTORY — DX: Paroxysmal atrial fibrillation: I48.0

## 2012-04-07 HISTORY — PX: LEFT HEART CATHETERIZATION WITH CORONARY ANGIOGRAM: SHX5451

## 2012-04-07 HISTORY — PX: PERCUTANEOUS CORONARY STENT INTERVENTION (PCI-S): SHX5485

## 2012-04-07 HISTORY — DX: Presence of coronary angioplasty implant and graft: Z95.5

## 2012-04-07 LAB — BASIC METABOLIC PANEL
BUN: 12 mg/dL (ref 6–23)
BUN: 12 mg/dL (ref 6–23)
Chloride: 100 mEq/L (ref 96–112)
Chloride: 99 mEq/L (ref 96–112)
Creatinine, Ser: 0.76 mg/dL (ref 0.50–1.10)
GFR calc Af Amer: >90 mL/min (ref >90)
GFR calc Af Amer: >90 mL/min (ref >90)
GFR calc non Af Amer: 89 mL/min — ABNORMAL LOW (ref >90)
Potassium: 2.9 mEq/L — ABNORMAL LOW (ref 3.5–5.1)

## 2012-04-07 LAB — CBC
HCT: 35.8 % — ABNORMAL LOW (ref 36.0–46.0)
Platelets: 229 10*3/uL (ref 150–400)
RBC: 4.34 MIL/uL (ref 3.87–5.11)
RDW: 12.6 % (ref 11.5–15.5)
WBC: 8.2 10*3/uL (ref 4.0–10.5)

## 2012-04-07 LAB — POCT I-STAT, CHEM 8
BUN: 16 mg/dL (ref 6–23)
Calcium, Ion: 1.19 mmol/L (ref 1.13–1.30)
Chloride: 100 mEq/L (ref 96–112)
Creatinine, Ser: 1 mg/dL (ref 0.50–1.10)

## 2012-04-07 LAB — CBC WITH DIFFERENTIAL/PLATELET
Basophils Absolute: 0 10*3/uL (ref 0.0–0.1)
HCT: 36.9 % (ref 36.0–46.0)
Hemoglobin: 13 g/dL (ref 12.0–15.0)
Lymphocytes Relative: 17 % (ref 12–46)
Lymphs Abs: 1.3 10*3/uL (ref 0.7–4.0)
MCV: 82.7 fL (ref 78.0–100.0)
Monocytes Absolute: 0.5 10*3/uL (ref 0.1–1.0)
Neutro Abs: 5.8 10*3/uL (ref 1.7–7.7)
RBC: 4.46 MIL/uL (ref 3.87–5.11)
RDW: 12.9 % (ref 11.5–15.5)
WBC: 7.7 10*3/uL (ref 4.0–10.5)

## 2012-04-07 LAB — POCT I-STAT TROPONIN I: Troponin i, poc: 0.04 ng/mL (ref 0.00–0.08)

## 2012-04-07 LAB — CARDIAC PANEL(CRET KIN+CKTOT+MB+TROPI)
CK, MB: 3.8 ng/mL (ref 0.3–4.0)
Relative Index: 3.6 — ABNORMAL HIGH (ref 0.0–2.5)
Relative Index: 3.9 — ABNORMAL HIGH (ref 0.0–2.5)
Total CK: 107 U/L (ref 7–177)

## 2012-04-07 LAB — PROTIME-INR
INR: 1.01 (ref 0.00–1.49)
Prothrombin Time: 13.5 seconds (ref 11.6–15.2)

## 2012-04-07 LAB — GLUCOSE, CAPILLARY
Glucose-Capillary: 122 mg/dL — ABNORMAL HIGH (ref 70–99)
Glucose-Capillary: 107 mg/dL — ABNORMAL HIGH (ref 70–99)
Glucose-Capillary: 138 mg/dL — ABNORMAL HIGH (ref 70–99)

## 2012-04-07 LAB — PRO B NATRIURETIC PEPTIDE: Pro B Natriuretic peptide (BNP): 1530 pg/mL — ABNORMAL HIGH (ref 0–125)

## 2012-04-07 SURGERY — LEFT HEART CATHETERIZATION WITH CORONARY ANGIOGRAM
Anesthesia: LOCAL

## 2012-04-07 MED ORDER — METOPROLOL TARTRATE 12.5 MG HALF TABLET
12.5000 mg | ORAL_TABLET | Freq: Two times a day (BID) | ORAL | Status: DC
  Administered 2012-04-07 – 2012-04-08 (×2): 12.5 mg via ORAL
  Filled 2012-04-07 (×3): qty 1

## 2012-04-07 MED ORDER — FAMOTIDINE IN NACL 20-0.9 MG/50ML-% IV SOLN
INTRAVENOUS | Status: AC
  Filled 2012-04-07: qty 50

## 2012-04-07 MED ORDER — SODIUM CHLORIDE 0.9 % IV SOLN: INTRAVENOUS | Status: DC

## 2012-04-07 MED ORDER — POTASSIUM CHLORIDE CRYS ER 20 MEQ PO TBCR
40.0000 meq | EXTENDED_RELEASE_TABLET | ORAL | Status: AC
  Administered 2012-04-07: 40 meq via ORAL
  Filled 2012-04-07: qty 2

## 2012-04-07 MED ORDER — METOPROLOL TARTRATE 25 MG PO TABS
25.0000 mg | ORAL_TABLET | Freq: Two times a day (BID) | ORAL | Status: DC
  Administered 2012-04-07 (×2): 25 mg via ORAL
  Filled 2012-04-07 (×3): qty 1

## 2012-04-07 MED ORDER — SODIUM CHLORIDE 0.9 % IV SOLN
INTRAVENOUS | Status: DC
  Administered 2012-04-07: 18:00:00 via INTRAVENOUS

## 2012-04-07 MED ORDER — SODIUM CHLORIDE 0.9 % IV SOLN
INTRAVENOUS | Status: DC
  Administered 2012-04-07: 12:00:00 via INTRAVENOUS

## 2012-04-07 MED ORDER — PANTOPRAZOLE SODIUM 40 MG PO TBEC
40.0000 mg | DELAYED_RELEASE_TABLET | Freq: Every day | ORAL | Status: DC
  Administered 2012-04-07: 40 mg via ORAL
  Filled 2012-04-07: qty 1

## 2012-04-07 MED ORDER — VERAPAMIL HCL 2.5 MG/ML IV SOLN
INTRAVENOUS | Status: AC
  Filled 2012-04-07: qty 2

## 2012-04-07 MED ORDER — ENOXAPARIN SODIUM 40 MG/0.4ML ~~LOC~~ SOLN
40.0000 mg | SUBCUTANEOUS | Status: DC
  Filled 2012-04-07: qty 0.4

## 2012-04-07 MED ORDER — POTASSIUM CHLORIDE 10 MEQ/100ML IV SOLN
10.0000 meq | INTRAVENOUS | Status: DC
  Administered 2012-04-07: 10 meq via INTRAVENOUS
  Filled 2012-04-07 (×4): qty 100

## 2012-04-07 MED ORDER — HEPARIN BOLUS VIA INFUSION
4000.0000 [IU] | Freq: Once | INTRAVENOUS | Status: AC
  Administered 2012-04-07: 4000 [IU] via INTRAVENOUS
  Filled 2012-04-07: qty 4000

## 2012-04-07 MED ORDER — HEPARIN SODIUM (PORCINE) 1000 UNIT/ML IJ SOLN
INTRAMUSCULAR | Status: AC
  Filled 2012-04-07: qty 1

## 2012-04-07 MED ORDER — HEPARIN (PORCINE) IN NACL 2-0.9 UNIT/ML-% IJ SOLN
INTRAMUSCULAR | Status: AC
  Filled 2012-04-07: qty 2000

## 2012-04-07 MED ORDER — INSULIN ASPART 100 UNIT/ML ~~LOC~~ SOLN: 5.0000 [IU] | Freq: Three times a day (TID) | SUBCUTANEOUS | Status: DC

## 2012-04-07 MED ORDER — ONDANSETRON HCL 4 MG/2ML IJ SOLN: 4.0000 mg | Freq: Four times a day (QID) | INTRAMUSCULAR | Status: DC | PRN

## 2012-04-07 MED ORDER — CLOPIDOGREL BISULFATE 300 MG PO TABS
ORAL_TABLET | ORAL | Status: AC
  Filled 2012-04-07: qty 1

## 2012-04-07 MED ORDER — MIDAZOLAM HCL 2 MG/2ML IJ SOLN
INTRAMUSCULAR | Status: AC
  Filled 2012-04-07: qty 2

## 2012-04-07 MED ORDER — SIMVASTATIN 40 MG PO TABS
40.0000 mg | ORAL_TABLET | Freq: Every evening | ORAL | Status: DC
  Filled 2012-04-07 (×2): qty 1

## 2012-04-07 MED ORDER — CLOPIDOGREL BISULFATE 75 MG PO TABS
75.0000 mg | ORAL_TABLET | Freq: Every day | ORAL | Status: DC
  Administered 2012-04-07: 75 mg via ORAL
  Filled 2012-04-07: qty 1

## 2012-04-07 MED ORDER — NITROGLYCERIN 0.2 MG/ML ON CALL CATH LAB
INTRAVENOUS | Status: AC
  Filled 2012-04-07: qty 1

## 2012-04-07 MED ORDER — INSULIN ASPART 100 UNIT/ML ~~LOC~~ SOLN
0.0000 [IU] | SUBCUTANEOUS | Status: DC
  Administered 2012-04-07: 1 [IU] via SUBCUTANEOUS
  Administered 2012-04-07: 5 [IU] via SUBCUTANEOUS

## 2012-04-07 MED ORDER — DIAZEPAM 5 MG PO TABS
5.0000 mg | ORAL_TABLET | ORAL | Status: AC
  Administered 2012-04-07: 5 mg via ORAL
  Filled 2012-04-07: qty 1

## 2012-04-07 MED ORDER — MORPHINE SULFATE 2 MG/ML IJ SOLN: 1.0000 mg | INTRAMUSCULAR | Status: DC | PRN

## 2012-04-07 MED ORDER — ASPIRIN 325 MG PO TABS
325.0000 mg | ORAL_TABLET | Freq: Every day | ORAL | Status: DC
  Administered 2012-04-07: 325 mg via ORAL
  Filled 2012-04-07: qty 1

## 2012-04-07 MED ORDER — METOCLOPRAMIDE HCL 10 MG PO TABS
10.0000 mg | ORAL_TABLET | Freq: Three times a day (TID) | ORAL | Status: DC
  Administered 2012-04-07 – 2012-04-08 (×4): 10 mg via ORAL
  Filled 2012-04-07 (×9): qty 1

## 2012-04-07 MED ORDER — INSULIN ASPART 100 UNIT/ML ~~LOC~~ SOLN
0.0000 [IU] | Freq: Three times a day (TID) | SUBCUTANEOUS | Status: DC
  Administered 2012-04-08: 09:00:00 2 [IU] via SUBCUTANEOUS

## 2012-04-07 MED ORDER — HEPARIN (PORCINE) IN NACL 100-0.45 UNIT/ML-% IJ SOLN
1000.0000 [IU]/h | INTRAMUSCULAR | Status: DC
  Administered 2012-04-07: 1150 [IU]/h via INTRAVENOUS
  Administered 2012-04-07: 1000 [IU]/h via INTRAVENOUS
  Filled 2012-04-07 (×2): qty 250

## 2012-04-07 MED ORDER — LIDOCAINE HCL (PF) 1 % IJ SOLN
INTRAMUSCULAR | Status: AC
  Filled 2012-04-07: qty 30

## 2012-04-07 MED ORDER — FUROSEMIDE 10 MG/ML IJ SOLN
40.0000 mg | Freq: Every day | INTRAMUSCULAR | Status: DC
  Administered 2012-04-07: 40 mg via INTRAVENOUS
  Filled 2012-04-07 (×2): qty 4

## 2012-04-07 MED ORDER — SODIUM CHLORIDE 0.9 % IV SOLN
0.2500 mg/kg/h | INTRAVENOUS | Status: DC
  Filled 2012-04-07: qty 250

## 2012-04-07 MED ORDER — SODIUM CHLORIDE 0.9 % IJ SOLN: 3.0000 mL | Freq: Two times a day (BID) | INTRAMUSCULAR | Status: DC

## 2012-04-07 MED ORDER — FENTANYL CITRATE 0.05 MG/ML IJ SOLN
25.0000 ug | INTRAMUSCULAR | Status: AC | PRN
  Administered 2012-04-07: 20:00:00 25 ug via INTRAVENOUS
  Filled 2012-04-07: qty 2

## 2012-04-07 MED ORDER — ASPIRIN EC 325 MG PO TBEC
325.0000 mg | DELAYED_RELEASE_TABLET | Freq: Every day | ORAL | Status: DC
  Filled 2012-04-07: qty 1

## 2012-04-07 MED ORDER — ASPIRIN 81 MG PO CHEW: 324.0000 mg | CHEWABLE_TABLET | ORAL | Status: DC

## 2012-04-07 MED ORDER — ACETAMINOPHEN 325 MG PO TABS: 650.0000 mg | ORAL_TABLET | Freq: Four times a day (QID) | ORAL | Status: DC | PRN

## 2012-04-07 MED ORDER — BIVALIRUDIN 250 MG IV SOLR
INTRAVENOUS | Status: AC
  Filled 2012-04-07: qty 250

## 2012-04-07 MED ORDER — INSULIN REGULAR HUMAN 100 UNIT/ML IJ SOLN
5.0000 [IU] | Freq: Three times a day (TID) | INTRAMUSCULAR | Status: DC
  Filled 2012-04-07 (×27): qty 0.05

## 2012-04-07 MED ORDER — ACETAMINOPHEN 650 MG RE SUPP: 650.0000 mg | Freq: Four times a day (QID) | RECTAL | Status: DC | PRN

## 2012-04-07 MED ORDER — ACETAMINOPHEN 325 MG PO TABS: 650.0000 mg | ORAL_TABLET | ORAL | Status: DC | PRN

## 2012-04-07 MED ORDER — SODIUM CHLORIDE 0.9 % IV SOLN: 250.0000 mL | INTRAVENOUS | Status: DC | PRN

## 2012-04-07 MED ORDER — SODIUM CHLORIDE 0.9 % IJ SOLN: 3.0000 mL | INTRAMUSCULAR | Status: DC | PRN

## 2012-04-07 MED ORDER — CLOPIDOGREL BISULFATE 75 MG PO TABS
75.0000 mg | ORAL_TABLET | Freq: Every day | ORAL | Status: DC
  Administered 2012-04-08: 09:00:00 75 mg via ORAL
  Filled 2012-04-07: qty 1

## 2012-04-07 MED ORDER — DIAZEPAM 2 MG PO TABS: 2.0000 mg | ORAL_TABLET | ORAL | Status: DC | PRN

## 2012-04-07 NOTE — H&P (Addendum)
PCP:   Georganna Skeans, MD   Cardiologist: Dr. Royann Shivers, [facility] [place] [facility]  Chief Complaint:  Chest pain and palpitations  HPI: [name] is a 61 year old female with history of diabetes, peripheral vascular disease presents to the ER today with the above-mentioned complaint. Patient reports that she's had intermittent chest pain and palpitation for a few weeks, however tonight started experiencing chest pain which he describes as pressure-like which lasted for about 15 minutes, after that she noted some lightheadedness and palpitations and decided to come to the ER. Earlier in the day she had seen Dr. Royann Shivers in his office where she was noted to have an abnormal EKG and tentatively scheduled for a cardiac catheterization tomorrow. In the emergency room to have atrial fibrillation with heart rate of 145 which improved to 80s range with IV diltiazem and has converted to sinus rhythm since,  Allergies:   Allergies  Allergen Reactions  . Levofloxacin Itching  . Morphine And Related Itching      Past Medical History  Diagnosis Date  . Hypertension   . Diabetes mellitus   . Hypercholesteremia   . Cancer   . Lung cancer   . Peripheral vascular disease     Past Surgical History  Procedure Date  . Abdominal hysterectomy   . Cholecystectomy   . Left knee replacement   . Lung surgery     Prior to Admission medications   Medication Sig Start Date End Date Taking? Authorizing Provider  aspirin 81 MG tablet Take 81 mg by mouth daily.     Yes Historical Provider, MD  Cholecalciferol (VITAMIN D PO) Take 2,000 Units by mouth daily.     Yes Historical Provider, MD  clopidogrel (PLAVIX) 75 MG tablet Take 75 mg by mouth daily.     Yes Historical Provider, MD  insulin NPH (HUMULIN N,NOVOLIN N) 100 UNIT/ML injection Inject 5-10 Units into the skin at bedtime. Sliding scale   Yes Historical Provider, MD  insulin regular (NOVOLIN R,HUMULIN R) 100 units/mL injection Inject 5  Units into the skin 3 (three) times daily before meals.   Yes Historical Provider, MD  lisinopril-hydrochlorothiazide (PRINZIDE,ZESTORETIC) 20-25 MG per tablet Take 1 tablet by mouth daily.     Yes Historical Provider, MD  metoCLOPramide (REGLAN) 10 MG tablet Take 10 mg by mouth 4 (four) times daily - after meals and at bedtime. Nausea   Yes Historical Provider, MD  pantoprazole (PROTONIX) 40 MG tablet Take 40 mg by mouth daily.   Yes Historical Provider, MD  simvastatin (ZOCOR) 40 MG tablet Take 40 mg by mouth every evening.   Yes Historical Provider, MD    Social History:  reports that she has quit smoking. Her smoking use included Cigarettes. She has a 20 pack-year smoking history. She does not have any smokeless tobacco history on file. She reports that she does not drink alcohol or use illicit drugs.  Family History  Problem Relation Age of Onset  . Hypertension Mother   . Stroke Father     Review of Systems:  Constitutional: Denies fever, chills, diaphoresis, appetite change and fatigue.  HEENT: Denies photophobia, eye pain, redness, hearing loss, ear pain, congestion, sore throat, rhinorrhea, sneezing, mouth sores, trouble swallowing, neck pain, neck stiffness and tinnitus.   Respiratory: Denies SOB, DOE, cough, chest tightness,  and wheezing.   Cardiovascular: Denies chest pain, palpitations and leg swelling.  Gastrointestinal: Denies nausea, vomiting, abdominal pain, diarrhea, constipation, blood in stool and abdominal distention.  Genitourinary: Denies dysuria, urgency, frequency,  hematuria, flank pain and difficulty urinating.  Musculoskeletal: Denies myalgias, back pain, joint swelling, arthralgias and gait problem.  Skin: Denies pallor, rash and wound.  Neurological: Denies dizziness, seizures, syncope, weakness, light-headedness, numbness and headaches.  Hematological: Denies adenopathy. Easy bruising, personal or family bleeding history  Psychiatric/Behavioral: Denies  suicidal ideation, mood changes, confusion, nervousness, sleep disturbance and agitation   Physical Exam: Blood pressure 118/59, temperature 98.6 F (37 C), temperature source Oral, resp. rate 24, SpO2 98.00%. General exam alert awake oriented x3 in no acute distress  HEENT pupils equal reactive to light oral mucosa moist and pink neck obese unable to appreciate JVD CVS S1-S2 regular rate and rhythm no murmurs rubs or gallops Lungs faint bibasilar crackles Abdomen soft nontender with normal bowel sounds no organomegaly Extremities no edema clubbing or cyanosis Neuro moves all extremities no localizing signs Skin without any  rashes or skin breakdown Musculoskeletal without evidence of synovitis or joint deformities  Labs on Admission:  Results for orders placed during the hospital encounter of 04/06/12 (from the past 48 hour(s))  CBC WITH DIFFERENTIAL     Status: Normal   Collection Time   04/06/12 11:53 PM      Component Value Range Comment   WBC 7.7  4.0 - 10.5 K/uL    RBC 4.46  3.87 - 5.11 MIL/uL    Hemoglobin 13.0  12.0 - 15.0 g/dL    HCT 30.8  65.7 - 84.6 %    MCV 82.7  78.0 - 100.0 fL    MCH 29.1  26.0 - 34.0 pg    MCHC 35.2  30.0 - 36.0 g/dL    RDW 96.2  95.2 - 84.1 %    Platelets 234  150 - 400 K/uL    Neutrophils Relative 75  43 - 77 %    Neutro Abs 5.8  1.7 - 7.7 K/uL    Lymphocytes Relative 17  12 - 46 %    Lymphs Abs 1.3  0.7 - 4.0 K/uL    Monocytes Relative 7  3 - 12 %    Monocytes Absolute 0.5  0.1 - 1.0 K/uL    Eosinophils Relative 1  0 - 5 %    Eosinophils Absolute 0.1  0.0 - 0.7 K/uL    Basophils Relative 0  0 - 1 %    Basophils Absolute 0.0  0.0 - 0.1 K/uL   POCT I-STAT TROPONIN I     Status: Normal   Collection Time   04/07/12 12:03 AM      Component Value Range Comment   Troponin i, poc 0.04  0.00 - 0.08 ng/mL    Comment 3            POCT I-STAT, CHEM 8     Status: Abnormal   Collection Time   04/07/12 12:05 AM      Component Value Range Comment    Sodium 138  135 - 145 mEq/L    Potassium 4.1  3.5 - 5.1 mEq/L    Chloride 100  96 - 112 mEq/L    BUN 16  6 - 23 mg/dL    Creatinine, Ser 3.24  0.50 - 1.10 mg/dL    Glucose, Bld 401 (*) 70 - 99 mg/dL    Calcium, Ion 0.27  2.53 - 1.30 mmol/L    TCO2 28  0 - 100 mmol/L    Hemoglobin 12.9  12.0 - 15.0 g/dL    HCT 66.4  40.3 - 47.4 %  Radiological Exams on Admission: Dg Chest Port 1 View  04/07/2012  *RADIOLOGY REPORT*  Clinical Data: Chest pain  PORTABLE CHEST - 1 VIEW  Comparison: 11/26/2011  Findings: Low lung volumes.  Bilateral central basilar airspace disease left greater than right.  Under aerated lungs.  No pneumothorax.  IMPRESSION: Bilateral airspace disease left greater than right.  Consider pulmonary edema, ARDS, bilateral pneumonia.  Original Report Authenticated By: Donavan Burnet, M.D.    Assessment/Plan 1. New onset A. fib with RVR Now rate controlled, converted to sinus rhythm Admit to telemetry, start PO metoprolol 25mg  BID Chads2 score is 1, start full dose aspirin for now Check 2-D echocardiogram and TSH Cardiology consultation Also advised to have a repeat Sleep study  2.  Chest pain: Could be secondary to demand from A. fib with RVR Was scheduled to have a cardiac cath as outpatient in a.m. Will notify cardiology and keep her n.p.o.  3. CHF: Could be secondary to A. fib with RVR Check stat BNP, Lasix 40 mg IV, followup echocardiogram  4. DIABETES, TYPE 2: Check hemoglobin A1c, resume NovoLog with meals  5. history of lobectomy for non-small cell lung cancer  6. DVT prophylaxis with Lovenox  CODE STATUS full  Time Spent on Admission:  Wei [place] [facility] Pager: [phone] 04/07/2012, 1:47 AM

## 2012-04-07 NOTE — Consult Note (Signed)
Reason for Consult: Chest pain, palpatations   Requesting Physician: Dr Royann Shivers  HPI: This is a 61 y.o. female with a past medical history significant forDM, HTN, dyslipidemia, morbid obesity, and minor CAD by cath in Feb 2010. She also has PVD and had Lt SFA PTA Aug 2012. She was just seen in the office 04/06/12 with complaints of post parandial chest pain and palpitations. She recently had an endoscopy by Dr [name] which was unremarkable. Her EKG reportedly had some subtile changes and she was set up for an OP cath. Last night she developed SSCP and tachycardia. It's reported she was in rapid AF by EMS and initially in the ER. She is now in NSR. Her Troponin is .92. She is currently pain free.  PMHx:  Past Medical History  Diagnosis Date  . Hypertension   . Hypercholesteremia   . Cancer   . Lung cancer   . Peripheral vascular disease   . Dysrhythmia 04/07/2012    atrial fib  . Sleep apnea     " mild form ,does not use cpap "  . Diabetes mellitus     insulin dependent  . GERD (gastroesophageal reflux disease)   . H/O hiatal hernia   . Arthritis    Past Surgical History  Procedure Date  . Abdominal hysterectomy   . Cholecystectomy   . Left knee replacement   . Lung surgery     FAMHx: Family History  Problem Relation Age of Onset  . Hypertension Mother   . Stroke Father   . Coronary artery disease Brother     SOCHx:  reports that she quit smoking about 27 years ago. Her smoking use included Cigarettes. She has a 20 pack-year smoking history. She has never used smokeless tobacco. She reports that she does not drink alcohol or use illicit drugs.  ALLERGIES: Allergies  Allergen Reactions  . Levofloxacin Itching  . Morphine And Related Itching    ROS: Pertinent items are noted in HPI. She denies claudication. Recent polypectomy by Dr [name]  HOME MEDICATIONS: Prescriptions prior to admission  Medication Sig Dispense Refill  . aspirin 81 MG tablet Take 81 mg by mouth  daily.        . Cholecalciferol (VITAMIN D PO) Take 2,000 Units by mouth daily.        . clopidogrel (PLAVIX) 75 MG tablet Take 75 mg by mouth daily.        . insulin NPH (HUMULIN N,NOVOLIN N) 100 UNIT/ML injection Inject 5-10 Units into the skin at bedtime. Sliding scale      . insulin regular (NOVOLIN R,HUMULIN R) 100 units/mL injection Inject 5 Units into the skin 3 (three) times daily before meals.      Marland Kitchen lisinopril-hydrochlorothiazide (PRINZIDE,ZESTORETIC) 20-25 MG per tablet Take 1 tablet by mouth daily.        . metoCLOPramide (REGLAN) 10 MG tablet Take 10 mg by mouth 4 (four) times daily - after meals and at bedtime. Nausea      . pantoprazole (PROTONIX) 40 MG tablet Take 40 mg by mouth daily.      . simvastatin (ZOCOR) 40 MG tablet Take 40 mg by mouth every evening.        HOSPITAL MEDICATIONS: I have reviewed the patient's current medications.  VITALS: Blood pressure 119/53, pulse 60, temperature 98.1 F (36.7 C), temperature source Oral, resp. rate 20, height 5\' 3"  (1.6 m), weight 111.177 kg (245 lb 1.6 oz), SpO2 99.00%.  PHYSICAL EXAM: General appearance: alert, cooperative, no  distress and morbidly obese Neck: no carotid bruit, no JVD, supple, symmetrical, trachea midline and thyroid not enlarged, symmetric, no tenderness/mass/nodules Lungs: clear to auscultation bilaterally Heart: regularly irregular rhythm Abdomen: morbidly obese Extremities: decreased distal pulses Pulses: diminnished Skin: cool and dry Neurologic: Grossly normal  LABS: Results for orders placed during the hospital encounter of 04/06/12 (from the past 48 hour(s))  CBC WITH DIFFERENTIAL     Status: Normal   Collection Time   04/06/12 11:53 PM      Component Value Range Comment   WBC 7.7  4.0 - 10.5 K/uL    RBC 4.46  3.87 - 5.11 MIL/uL    Hemoglobin 13.0  12.0 - 15.0 g/dL    HCT 40.9  81.1 - 91.4 %    MCV 82.7  78.0 - 100.0 fL    MCH 29.1  26.0 - 34.0 pg    MCHC 35.2  30.0 - 36.0 g/dL    RDW  78.2  95.6 - 21.3 %    Platelets 234  150 - 400 K/uL    Neutrophils Relative 75  43 - 77 %    Neutro Abs 5.8  1.7 - 7.7 K/uL    Lymphocytes Relative 17  12 - 46 %    Lymphs Abs 1.3  0.7 - 4.0 K/uL    Monocytes Relative 7  3 - 12 %    Monocytes Absolute 0.5  0.1 - 1.0 K/uL    Eosinophils Relative 1  0 - 5 %    Eosinophils Absolute 0.1  0.0 - 0.7 K/uL    Basophils Relative 0  0 - 1 %    Basophils Absolute 0.0  0.0 - 0.1 K/uL   POCT I-STAT TROPONIN I     Status: Normal   Collection Time   04/07/12 12:03 AM      Component Value Range Comment   Troponin i, poc 0.04  0.00 - 0.08 ng/mL    Comment 3            POCT I-STAT, CHEM 8     Status: Abnormal   Collection Time   04/07/12 12:05 AM      Component Value Range Comment   Sodium 138  135 - 145 mEq/L    Potassium 4.1  3.5 - 5.1 mEq/L    Chloride 100  96 - 112 mEq/L    BUN 16  6 - 23 mg/dL    Creatinine, Ser 0.86  0.50 - 1.10 mg/dL    Glucose, Bld 578 (*) 70 - 99 mg/dL    Calcium, Ion 4.69  6.29 - 1.30 mmol/L    TCO2 28  0 - 100 mmol/L    Hemoglobin 12.9  12.0 - 15.0 g/dL    HCT 52.8  41.3 - 24.4 %   CARDIAC PANEL(CRET KIN+CKTOT+MB+TROPI)     Status: Abnormal   Collection Time   04/07/12  2:25 AM      Component Value Range Comment   Total CK 107  7 - 177 U/L    CK, MB 3.8  0.3 - 4.0 ng/mL    Troponin I 0.92 (*) <0.30 ng/mL    Relative Index 3.6 (*) 0.0 - 2.5   PRO B NATRIURETIC PEPTIDE     Status: Abnormal   Collection Time   04/07/12  2:25 AM      Component Value Range Comment   Pro B Natriuretic peptide (BNP) 1530.0 (*) 0 - 125 pg/mL   GLUCOSE, CAPILLARY  Status: Abnormal   Collection Time   04/07/12  3:32 AM      Component Value Range Comment   Glucose-Capillary 256 (*) 70 - 99 mg/dL   CBC     Status: Abnormal   Collection Time   04/07/12  6:30 AM      Component Value Range Comment   WBC 8.2  4.0 - 10.5 K/uL    RBC 4.34  3.87 - 5.11 MIL/uL    Hemoglobin 12.5  12.0 - 15.0 g/dL    HCT 16.1 (*) 09.6 - 46.0 %    MCV 82.5   78.0 - 100.0 fL    MCH 28.8  26.0 - 34.0 pg    MCHC 34.9  30.0 - 36.0 g/dL    RDW 04.5  40.9 - 81.1 %    Platelets 229  150 - 400 K/uL   BASIC METABOLIC PANEL     Status: Abnormal   Collection Time   04/07/12  6:30 AM      Component Value Range Comment   Sodium 140  135 - 145 mEq/L    Potassium 2.9 (*) 3.5 - 5.1 mEq/L    Chloride 100  96 - 112 mEq/L    CO2 29  19 - 32 mEq/L    Glucose, Bld 137 (*) 70 - 99 mg/dL    BUN 12  6 - 23 mg/dL    Creatinine, Ser 9.14  0.50 - 1.10 mg/dL    Calcium 9.8  8.4 - 78.2 mg/dL    GFR calc non Af Amer 89 (*) >90 mL/min    GFR calc Af Amer >90  >90 mL/min   GLUCOSE, CAPILLARY     Status: Abnormal   Collection Time   04/07/12  6:36 AM      Component Value Range Comment   Glucose-Capillary 122 (*) 70 - 99 mg/dL   GLUCOSE, CAPILLARY     Status: Abnormal   Collection Time   04/07/12  8:09 AM      Component Value Range Comment   Glucose-Capillary 107 (*) 70 - 99 mg/dL    Comment 1 Notify RN      Comment 2 Documented in Chart       IMAGING: Dg Chest Port 1 View  04/07/2012  *RADIOLOGY REPORT*  Clinical Data: Chest pain  PORTABLE CHEST - 1 VIEW  Comparison: 11/26/2011  Findings: Low lung volumes.  Bilateral central basilar airspace disease left greater than right.  Under aerated lungs.  No pneumothorax.  IMPRESSION: Bilateral airspace disease left greater than right.  Consider pulmonary edema, ARDS, bilateral pneumonia.  Original Report Authenticated By: Donavan Burnet, M.D.    IMPRESSION: Principal Problem:  *Chest pain Active Problems:  NSTEMI (non-ST elevated myocardial infarction)  PAF, new this admission  DIABETES, TYPE 2  OBESITY  HYPERTENSION  PVD (peripheral vascular disease), Lt SFA PTA Aug 2012   RECOMMENDATION: Cath today. If no significant change her symptoms could be from PAF. I suspect she has sleep apnea.  Time Spent Directly with Patient: 40 minutes  Annisa Mazzarella K 04/07/2012, 11:38 AM

## 2012-04-07 NOTE — Progress Notes (Signed)
Subjective: Patient denies chest pain, feeling better. No palpitation. She denies Dyspnea.  She follow with D Mohamed for her history of lung cancer. She has appointment every year now.  Note from Claiborne Billings, NP noticed and reviewed.   Objective: Filed Vitals:   04/07/12 0010 04/07/12 0200 04/07/12 0302 04/07/12 0721  BP: 118/59 152/76 148/61 124/63  Pulse:  76 71 64  Temp:  97.5 F (36.4 C)  98.1 F (36.7 C)  TempSrc:  Oral  Oral  Resp: 24 18  20   Height:  5\' 3"  (1.6 m)    Weight:  113.082 kg (249 lb 4.8 oz)  111.177 kg (245 lb 1.6 oz)  SpO2: 98% 100%  99%   Weight change:    General: Alert, awake, oriented x3, in no acute distress.  HEENT: No bruits, no goiter.  Heart: Regular rate and rhythm, without murmurs, rubs, gallops.  Lungs: CTA, bilateral air movement.  Abdomen: Soft, nontender, nondistended, positive bowel sounds.  Neuro: Grossly intact, nonfocal. Extremities; no edema.   Lab Results:  Basename 04/07/12 0630 04/07/12 0005  NA 140 138  K 2.9* 4.1  CL 100 100  CO2 29 --  GLUCOSE 137* 323*  BUN 12 16  CREATININE 0.75 1.00  CALCIUM 9.8 --  MG -- --  PHOS -- --    Basename 04/07/12 0630 04/07/12 0005 04/06/12 2353  WBC 8.2 -- 7.7  NEUTROABS -- -- 5.8  HGB 12.5 12.9 --  HCT 35.8* 38.0 --  MCV 82.5 -- 82.7  PLT 229 -- 234    Basename 04/07/12 0225  CKTOTAL 107  CKMB 3.8  CKMBINDEX --  TROPONINI 0.92*    No results found for this or any previous visit (from the past 240 hour(s)).  Studies/Results: Dg Chest Port 1 View  04/07/2012  *RADIOLOGY REPORT*  Clinical Data: Chest pain  PORTABLE CHEST - 1 VIEW  Comparison: 11/26/2011  Findings: Low lung volumes.  Bilateral central basilar airspace disease left greater than right.  Under aerated lungs.  No pneumothorax.  IMPRESSION: Bilateral airspace disease left greater than right.  Consider pulmonary edema, ARDS, bilateral pneumonia.  Original Report Authenticated By: Donavan Burnet, M.D.    Medications:  I have reviewed the patient's current medications.  1-NSTEMI: Patient with intermittent chest pain, angina, mild increase troponin. Increase troponin could be related to A fib RVR , rate at 145 in ED.                      Continue with Heparin. Patient on aspirin and plavix, will defer to cardio triple anticoagulation. Metoprolol, Simvastatin. For Possible Cath today. Dr                     [name] aware of patient.   2-Afib AVR, Rate controlled now. Patient received one dose IV Cardizem. Continue with metoprolol. Defer to cardio anticoagulation long term. In mean time patient on heparin Gtt.  3-Diabetes: Novolog 5 units before meals.   4 CHF exacerbation: Chest x ray with Bilateral pulmonary edema vs PNA: Patient afebrile, no leukocytosis. Favor Pulmonary edema. Patient denies cough, SOB. Increase BNP at 1530. Continue with 40 mg IV lasix daily. Monitor for fever. Will repeat Chest x ary at some point. ECHO pending evaluate EF and diastolic function.  5-Hypokalemia: Replace with 4 runs 10 meq IV.     LOS: 1 day   REGALADO,BELKYS M.D.  [facility] 04/07/2012, 8:21 AM

## 2012-04-07 NOTE — Progress Notes (Signed)
Report given to receiving RN.

## 2012-04-07 NOTE — Progress Notes (Signed)
Utilization review completed.  

## 2012-04-07 NOTE — Consult Note (Addendum)
Pt. Seen and examined. Agree with the NP/PA-C note as written. 61 yo female who was seen in the office yesterday for chest pain. EKG shows lateral TW flattening. She was scheduled for outpatient cath. Last night, she had more chest pain aid was actually in A-flutter at 150, with subsequent a-fib in the 80's.  Today she is in sinus rhythm. She did have an NSTEMI, which is likely a Type II MI. There is a history of mild non-obstructive CAD by cath in 2010.  I have recommended cardiac catheterization today with possible intervention. She is agreeable to this approach and has provided informed consent. Will plan hopeful right radial approach.  K+ was 2.9 this morning.  Repleting now. Check STAT BMET. If K+ has improved, will be okay for cath this afternoon.  Appreciate [facility] care, will transfer patient to our service.  Chrystie Nose, MD, [facility] Attending Cardiologist [facility]

## 2012-04-07 NOTE — Progress Notes (Signed)
ANTICOAGULATION CONSULT NOTE - Follow-Up Consult  Pharmacy Consult for Heparin Indication: atrial fibrillation, r/o ACS  Allergies  Allergen Reactions  . Levofloxacin Itching  . Morphine And Related Itching   Labs:  Basename 04/07/12 1120 04/07/12 0630 04/07/12 0225 04/07/12 0005 04/06/12 2353  HGB -- 12.5 -- 12.9 --  HCT -- 35.8* -- 38.0 36.9  PLT -- 229 -- -- 234  APTT -- -- -- -- --  LABPROT 13.5 -- -- -- --  INR 1.01 -- -- -- --  HEPARINUNFRC 0.73* -- -- -- --  CREATININE 0.76 0.75 -- 1.00 --  CKTOTAL 108 -- 107 -- --  CKMB 4.2* -- 3.8 -- --  TROPONINI 1.46* -- 0.92* -- --      Assessment: 61 y.o. Female presents with CP/palpitations. Noted to have afib - now converted to sinus rhythm. Was already scheduled PTA for cardiac cath today due to abnormal EKG.  Heparin level = 0.73  Goal of Therapy:  Heparin level 0.3-0.7 units/ml Monitor platelets by anticoagulation protocol: Yes   Plan:  1) Decrease heparin to 1000 units / hr 2) Follow up after cath  Okey Regal, PharmD [phone]  04/07/2012,12:39 PM

## 2012-04-07 NOTE — Progress Notes (Signed)
[facility] progress note Chief complaint. Abnormal cardiac enzymes. History of present illness. This 61 year old female was admitted through the emergency room after experiencing chest pain which lasted about 15 minutes. She is seeing her cardiologist earlier in the day at [facility] and was told she had an abnormal EKG. She was told that she would require a cardiac catheterization. In the emergency room she was noted to be in atrial fibrillation with a rate of 145. Given IV diltiazem and her rate improved to 80 and sinus rhythm. Her first set of cardiac enzymes have resulted showing total CK 107, MB 3.8, troponin elevated 0.92. I did see the patient at the bedside and she is experiencing no chest pain currently. Vital signs. Temperature 97.5, pulse 71, respiration 18, blood pressure 148/61. O2 sats ration 100%. Cardiac. Regular rate and rhythm. Lungs. Clear and equal. Abdomen. Soft without pain. Impression/plan Problem #1 elevated troponin. I did discuss the case with Dr. [name] on-call for [facility]. We agree to start the patient on a heparin drip per pharmacy consult. Cardiology will see the patient later this a.m. We'll also continue her aspirin and Plavix concurrently as per cardiology recommendation. I will repeat an EKG this a.m. at 07:00.

## 2012-04-07 NOTE — Progress Notes (Signed)
ANTICOAGULATION CONSULT NOTE - Initial Consult  Pharmacy Consult for Heparin Indication: atrial fibrillation, r/o ACS  Allergies  Allergen Reactions  . Levofloxacin Itching  . Morphine And Related Itching    Patient Measurements: Height: 5\' 3"  (160 cm) Weight: 249 lb 4.8 oz (113.082 kg) IBW/kg (Calculated) : 52.4  Heparin Dosing Weight: 80 kg  Vital Signs: Temp: 97.5 F (36.4 C) (07/12 0200) Temp src: Oral (07/12 0200) BP: 148/61 mmHg (07/12 0302) Pulse Rate: 71  (07/12 0302)  Labs:  Basename 04/07/12 0225 04/07/12 0005 04/06/12 2353  HGB -- 12.9 13.0  HCT -- 38.0 36.9  PLT -- -- 234  APTT -- -- --  LABPROT -- -- --  INR -- -- --  HEPARINUNFRC -- -- --  CREATININE -- 1.00 --  CKTOTAL 107 -- --  CKMB 3.8 -- --  TROPONINI 0.92* -- --    Estimated Creatinine Clearance: 70.6 ml/min (by C-G formula based on Cr of 1).   Medical History: Past Medical History  Diagnosis Date  . Hypertension   . Diabetes mellitus   . Hypercholesteremia   . Cancer   . Lung cancer   . Peripheral vascular disease     Medications:  Prescriptions prior to admission  Medication Sig Dispense Refill  . aspirin 81 MG tablet Take 81 mg by mouth daily.        . Cholecalciferol (VITAMIN D PO) Take 2,000 Units by mouth daily.        . clopidogrel (PLAVIX) 75 MG tablet Take 75 mg by mouth daily.        . insulin NPH (HUMULIN N,NOVOLIN N) 100 UNIT/ML injection Inject 5-10 Units into the skin at bedtime. Sliding scale      . insulin regular (NOVOLIN R,HUMULIN R) 100 units/mL injection Inject 5 Units into the skin 3 (three) times daily before meals.      Marland Kitchen lisinopril-hydrochlorothiazide (PRINZIDE,ZESTORETIC) 20-25 MG per tablet Take 1 tablet by mouth daily.        . metoCLOPramide (REGLAN) 10 MG tablet Take 10 mg by mouth 4 (four) times daily - after meals and at bedtime. Nausea      . pantoprazole (PROTONIX) 40 MG tablet Take 40 mg by mouth daily.      . simvastatin (ZOCOR) 40 MG tablet Take  40 mg by mouth every evening.        Assessment: 61 y.o. Female presents with CP/palpitations. Noted to have afib - now converted to sinus rhythm. Was already scheduled PTA for cardiac cath today due to abnormal EKG.  Goal of Therapy:  Heparin level 0.3-0.7 units/ml Monitor platelets by anticoagulation protocol: Yes   Plan:  1. Heparin bolus IV 4000 units 2. Heparin gtt at 1150 units/hr 3. F/u 6 hr heparin level or post cath 4. Daily heparin level and CBC 5. Will d/c lovenox  Christoper Fabian, PharmD, BCPS Clinical pharmacist, pager [phone] 04/07/2012,4:32 AM

## 2012-04-07 NOTE — Progress Notes (Signed)
Patient assigned to Dr. Sunnie Nielsen, I did not see or evaluate the patient.  Brandi Baby A, MD 04/07/2012, 7:41 AM

## 2012-04-07 NOTE — Progress Notes (Deleted)
Echocardiogram 2D Echocardiogram has been performed.  [name] 04/07/2012, 10:33 AM

## 2012-04-07 NOTE — Op Note (Signed)
[name] is a 61 y.o. female    [id number] LOCATION:  FACILITY: [facility]  PHYSICIAN: Nanetta Batty, M.D. April 05, 1951   DATE OF PROCEDURE:  04/07/2012  DATE OF DISCHARGE:  [facility]  CARDIAC CATHETERIZATION     History obtained from chart review. 61 y.o. female with a history of DM2, HTN, dyslipidemia, morbid obesity, and minor CAD by cath in Feb 2010. She also has PVD and had Lt SFA PTA Aug 2012. She was just seen in the office 04/06/12 with complaints of post parandial chest pain and palpitations. She recently had an endoscopy by Dr [name] which was unremarkable. Her EKG reportedly had some subtile changes and she was set up for an OP cath. Last night she developed SSCP and tachycardia. It's reported she was in rapid AF by EMS and initially in the ER. She is now in NSR. Her Troponin was .92 and then rose to 1.4. She is currently pain free and referred for [facility] and possible intervention.    PROCEDURE DESCRIPTION:    The patient was brought to the second floor  [facility] Cardiac cath lab in the postabsorptive state. She was  premedicated with Valium 5 mg by mouth, and IV Versed.. Her right groin was prepped and shaved in usual sterile fashion. Xylocaine 1% was used  for local anesthesia. A 5 upgraded to a 6 [place] sheath was inserted into the right common femoral  artery using standard Seldinger technique.    HEMODYNAMICS:    AO SYSTOLIC/AO DIASTOLIC: 150/70   ANGIOGRAPHIC RESULTS:   The patient received an additional 300 mg of by mouth Plavix. Total of 180 cc was used during the entire case. The patient received Angiomax bolus with an ACT documented at 469. Using a 6 [place] XB 3.0 cm LAD guide catheter with side holes along the .014 /300cm  Asahi soft wire and 2 mm x 12 mm long emerge balloon into plasty was performed. The balloon was inflated at 10 atmospheres. Following this a 2.75 mm x 18 mm long Xience expedition drug-eluting stent was then  carefully positioned angiographically and deployed at 14 atmospheres. 200 mcg of intra-coronary nitroglycerin was then administered. Post dilatation was performed with a 3.0 mm x 15 mm long [place] sprinter balloon at 16 atmospheres (3.2 mm) resulting reduction a 99% proximal LAD to 0% residual with excellent flow. There were no hemodynamic or electrocardiographic sequelae. The guidewire and catheter were removed and the sheath was sewn securely in place. The patient left lateral stable condition. Angiomax was turned off.    IMPRESSION:successful PCI and stenting of high-grade proximal LAD stenosis using Xience expedition drug-eluting stent, aspirin, Plavix and Angiomax  The patient did present with paroxysmal atrial fibrillation and had a non-ST segment elevation myocardial infarction. Certainly possible that her PAF was related to ischemia and therefore she may not need long-term Coumadin anticoagulation but rather simply dual antiplatelet therapy.Runell Gess MD, [facility] 04/07/2012 5:16 PM

## 2012-04-07 NOTE — Progress Notes (Signed)
TR BAND REMOVAL  LOCATION:  right radial  DEFLATED PER PROTOCOL:  yes  TIME BAND OFF / DRESSING APPLIED: 2145   SITE UPON ARRIVAL:   Level 0  SITE AFTER BAND REMOVAL:  Level 0  REVERSE ALLEN'S TEST:    positive  CIRCULATION SENSATION AND MOVEMENT:  Within Normal Limits  yes  COMMENTS:    

## 2012-04-07 NOTE — Progress Notes (Signed)
Site area: right groin  Site Prior to Removal:  Level 0  Pressure Applied For 20 MINUTES    Minutes Beginning at 2020  Manual:   yes  Patient Status During Pull:  stablw  Post Pull Groin Site:  Level 0  Post Pull Instructions Given:  yes  Post Pull Pulses Present:  yes  Dressing Applied:  yes  Comments:

## 2012-04-07 NOTE — CV Procedure (Signed)
[facility]     CARDIAC CATHETERIZATION REPORT  [name]   [id number] August 06, 1951  Performing Cardiologist: Chrystie Nose Primary Physician: Georganna Skeans, MD Primary Cardiologist:  Dr. Royann Shivers  Procedures Performed:  Left Heart Catheterization via 5 Fr right femoral artery access  Left Ventriculography, (RAO/LAO) 15 ml/sec for 30 ml total contrast  Indication(s): NSTEMI  History: 61 y.o. female with a history of DM2, HTN, dyslipidemia, morbid obesity, and minor CAD by cath in Feb 2010. She also has PVD and had Lt SFA PTA Aug 2012. She was just seen in the office 04/06/12 with complaints of post parandial chest pain and palpitations. She recently had an endoscopy by Dr [name] which was unremarkable. Her EKG reportedly had some subtile changes and she was set up for an OP cath. Last night she developed SSCP and tachycardia. It's reported she was in rapid AF by EMS and initially in the ER. She is now in NSR. Her Troponin was .92 and then rose to 1.4. She is currently pain free and referred for [facility] and possible intervention.   Consent: The procedure with Risks/Benefits/Alternatives and Indications was reviewed with the patient (and family).  All questions were answered.    Risks / Complications include, but not limited to: Death, MI, CVA/TIA, VF/VT (with defibrillation), Bradycardia (need for temporary pacer placement), contrast induced nephropathy, bleeding / bruising / hematoma / pseudoaneurysm, vascular or coronary injury (with possible emergent CT or Vascular Surgery), adverse medication reactions, infection.    The patient and family voice understanding and agree to proceed.    Risks of procedure as well as the alternatives and risks of each were explained to the (patient/caregiver).  Consent for procedure obtained. Consent for signed by MD and patient with RN witness -- placed on chart.  Procedure: The patient was brought to the 2nd Floor Deep [facility]  Cardiac Catheterization Lab in the fasting state and prepped and draped in the usual sterile fashion for (Right groin/radial) access. A modified Allen's test with plethysmography was performed on the right wrist demonstrating adequate Ulnar Artery collateral flow.   Sterile technique was used including antiseptics, cap, gloves, gown, hand hygiene, mask and sheet.  Skin prep: Chlorhexidine;  Time Out: Verified patient identification, verified procedure, site/side was marked, verified correct patient position, special equipment/implants available, medications/allergies/relevent history reviewed, required imaging and test results available.  Performed  Initially, the right radial artery was identified and accessed with the seldinger technique. A 22F radial sheath was placed and 10 cc of radial cocktail was given.  5000U of heparin was given, however, the catheter was unable to be manipulated into the left ventricle. Multiple catheters and attempts were made, including with deep breathing and change in position. UItimately it was decided to abort the radial approach.   The right femoral head was identified using tactile and fluoroscopic technique.  The right groin was anesthetized with 1% subcutaneous Lidocaine.  The right Common Femoral Artery was accessed using the Modified Seldinger Technique with placement of a antimicrobial bonded/coated single lumen (5 Fr) sheath was placed using the Seldinger technique.  The sheath was aspirated and flushed.    A 5 Fr JL4 Catheter was advanced of over a Standard J wire into the ascending Aorta.  The catheter was used to engage the left coronary artery.  Multiple cineangiographic views of the leftcoronary artery system(s) were performed. A 5 Fr JR4 and Williams Right Catheter was advanced of over a Safety J wire into the ascending Aorta.  The catheter was used to engage the right coronary artery.  Multiple cineangiographic views of the right coronary artery system(s)  were performed. This catheter was then exchanged over the Standard J wire for an angled Pigtail catheter that was advanced across the Aortic Valve.  LV hemodynamics were measured and Left Ventriculography was performed.  LV hemodynamics were then re-sampled, and the catheter was pulled back across the Aortic Valve for measurement of "pull-back" gradient.  The catheter and the wire was removed completely out of the body.  After it was determined that percutaneous intervention was necessary, Dr. [name] was called in for the additional procedure. Please see his note for details.  The patient  was stable before, during and following the procedure.   Patient did tolerate procedure well. There were not complications. EBL: <10 cc  Medications:  Premedication: 5 mg  Valium  Sedation: 2 mg IV Versed  Contrast:  90 cc Omnipaque  Heparin 5000 IU, 10 cc radial cocktail  Hemodynamics:  Central Aortic Pressure / Mean Aortic Pressure: 155/78  LV Pressure / LV End diastolic Pressure:  24  Left Ventriculography:  EF:  40-45%  Wall Motion: Anterior hypokinesis  Coronary Angiographic Data:  Left Main:  There are separate ostia noted for the LAD/LCx.  Left Anterior Descending (LAD):  Very difficult to engage, however, there is a proximal, discrete 99% stenosis after the first septal perforator. The vessel reaches the apex.  1st diagonal (D1):  No significant disease.  Circumflex (LCx):  Mild luminal irregularities  1st obtuse marginal:  Smaller vessel, no obstructive disease  Right Coronary Artery: Dominant vessel with mild luminal irregularities.  posterior descending artery: 30-40% proximal stenosis, small vessel  posterior lateral branch:  No significant obstruction.    Impression: 1.  Severe, discrete proximal stenosis of the LAD - 99%. 2.  Anterior wall hypokinesis, EF 40-45%. 3.  LVEDP = 24 mmHg  Plan: 1.  Discussed with Dr. [name]. Will plan intervention to the proximal LAD. Given  the vessel size and location, we opted for a DES, despite the fact that she has had A-fib on presentation. We suspect this one episode of a-fib may be ischemically mediated. Given her younger age, will plan ASA and Plavix for anticoagulation and determine her burden of a-fib as an outpatient with a monitor.  The case and results was discussed with the patient (and family). The case and results was not discussed with the patient's PCP. The case and results was discussed with the patient's Cardiologist.  Time Spend Directly with Patient:  60 minutes  Chrystie Nose, MD, [facility] Attending Cardiologist [facility]  Royale Lennartz C 04/07/2012, 4:33 PM

## 2012-04-08 DIAGNOSIS — I255 Ischemic cardiomyopathy: Secondary | ICD-10-CM | POA: Diagnosis present

## 2012-04-08 DIAGNOSIS — I2510 Atherosclerotic heart disease of native coronary artery without angina pectoris: Secondary | ICD-10-CM | POA: Diagnosis present

## 2012-04-08 LAB — BASIC METABOLIC PANEL
BUN: 13 mg/dL (ref 6–23)
Calcium: 9.5 mg/dL (ref 8.4–10.5)
Chloride: 100 mEq/L (ref 96–112)
Creatinine, Ser: 0.82 mg/dL (ref 0.50–1.10)
GFR calc Af Amer: 87 mL/min — ABNORMAL LOW (ref >90)
GFR calc non Af Amer: 75 mL/min — ABNORMAL LOW (ref >90)

## 2012-04-08 LAB — MAGNESIUM: Magnesium: 1.6 mg/dL (ref 1.5–2.5)

## 2012-04-08 LAB — CBC
HCT: 36.9 % (ref 36.0–46.0)
MCH: 28.7 pg (ref 26.0–34.0)
MCHC: 34.4 g/dL (ref 30.0–36.0)
MCV: 83.5 fL (ref 78.0–100.0)
Platelets: 209 10*3/uL (ref 150–400)
RDW: 12.9 % (ref 11.5–15.5)
WBC: 6.7 10*3/uL (ref 4.0–10.5)

## 2012-04-08 LAB — GLUCOSE, CAPILLARY: Glucose-Capillary: 156 mg/dL — ABNORMAL HIGH (ref 70–99)

## 2012-04-08 MED ORDER — METOPROLOL TARTRATE 12.5 MG HALF TABLET: 12.5000 mg | ORAL_TABLET | Freq: Two times a day (BID) | ORAL | Status: DC

## 2012-04-08 NOTE — Progress Notes (Signed)
[facility]  DAILY PROGRESS NOTE   Subjective:  Walked length of hallway with PT without angina or dyspnea. Both the radial and femoral access sites are asymptomatic. No further arrhythmia.  Tiny increase in cTnI consistent with small anterior wall NSTEMI.  Objective:  Temp:  [97.5 F (36.4 C)-98.8 F (37.1 C)] 98 F (36.7 C) (07/13 0816) Pulse Rate:  [55-75] 75  (07/13 0816) Resp:  [18-23] 21  (07/13 0816) BP: (119-141)/(40-65) 141/48 mmHg (07/13 0816) SpO2:  [95 %-100 %] 100 % (07/13 0816) Weight change: -1.905 kg (-4 lb 3.2 oz)  Intake/Output from previous day:    Intake/Output from this shift:    Medications: Current Facility-Administered Medications  Medication Dose Route Frequency Provider Last Rate Last Dose  . 0.9 %  sodium chloride infusion   Intravenous Continuous Runell Gess, MD 75 mL/hr at 04/07/12 1745    . acetaminophen (TYLENOL) tablet 650 mg  650 mg Oral Q6H PRN Zannie Cove, MD       Or  . acetaminophen (TYLENOL) suppository 650 mg  650 mg Rectal Q6H PRN Zannie Cove, MD      . acetaminophen (TYLENOL) tablet 650 mg  650 mg Oral Q4H PRN Runell Gess, MD      . aspirin EC tablet 325 mg  325 mg Oral Daily Runell Gess, MD      . bivalirudin (ANGIOMAX) 250 MG injection           . clopidogrel (PLAVIX) 300 MG tablet           . clopidogrel (PLAVIX) tablet 75 mg  75 mg Oral Q breakfast Runell Gess, MD   75 mg at 04/08/12 0840  . diazepam (VALIUM) tablet 2 mg  2 mg Oral Q4H PRN Runell Gess, MD      . diazepam (VALIUM) tablet 5 mg  5 mg Oral On Call Abelino Derrick, PA   5 mg at 04/07/12 1405  . famotidine (PEPCID) 20-0.9 MG/50ML-% IVPB           . fentaNYL (SUBLIMAZE) injection 25 mcg  25 mcg Intravenous Q4H PRN Marykay Lex, MD   25 mcg at 04/07/12 2012  . furosemide (LASIX) injection 40 mg  40 mg Intravenous Daily Eda Paschal [place], [place]   40 mg at 04/07/12 0304  . heparin 1000 UNIT/ML injection           .  heparin 2-0.9 UNIT/ML-% infusion           . insulin aspart (novoLOG) injection 0-9 Units  0-9 Units Subcutaneous TID WC Runell Gess, MD   2 Units at 04/08/12 [phone]  . insulin aspart (novoLOG) injection 5 Units  5 Units Subcutaneous TID AC Zannie Cove, MD      . lidocaine (XYLOCAINE) 1 % injection           . metoCLOPramide (REGLAN) tablet 10 mg  10 mg Oral TID PC & HS Zannie Cove, MD   10 mg at 04/08/12 0843  . metoprolol tartrate (LOPRESSOR) tablet 12.5 mg  12.5 mg Oral BID Eda Paschal Avis, PA   12.5 mg at 04/07/12 2127  . midazolam (VERSED) 2 MG/2ML injection           . nitroGLYCERIN (NTG ON-CALL) 0.2 mg/mL injection           . ondansetron (ZOFRAN) injection 4 mg  4 mg Intravenous Q6H PRN Runell Gess, MD      .  pantoprazole (PROTONIX) EC tablet 40 mg  40 mg Oral Daily Zannie Cove, MD   40 mg at 04/07/12 1029  . potassium chloride SA (K-DUR,KLOR-CON) CR tablet 40 mEq  40 mEq Oral STAT Chrystie Nose, MD   40 mEq at 04/07/12 1216  . simvastatin (ZOCOR) tablet 40 mg  40 mg Oral QPM Zannie Cove, MD      . verapamil (ISOPTIN) 2.5 MG/ML injection           . DISCONTD: 0.9 %  sodium chloride infusion  250 mL Intravenous PRN Abelino Derrick, PA      . DISCONTD: 0.9 %  sodium chloride infusion   Intravenous Continuous Abelino Derrick, [place] 75 mL/hr at 04/07/12 1216    . DISCONTD: aspirin chewable tablet 324 mg  324 mg Oral [address] [place], [place]      . DISCONTD: aspirin tablet 325 mg  325 mg Oral Daily Zannie Cove, MD   325 mg at 04/07/12 1022  . DISCONTD: bivalirudin (ANGIOMAX) 5 mg/mL in sodium chloride 0.9 % 50 mL infusion  0.25 mg/kg/hr Intravenous To Cath Runell Gess, MD      . DISCONTD: clopidogrel (PLAVIX) tablet 75 mg  75 mg Oral Daily Zannie Cove, MD   75 mg at 04/07/12 1022  . DISCONTD: heparin ADULT infusion 100 units/mL (25000 units/250 mL)  1,000 Units/hr Intravenous Continuous Chrystie Nose, MD 10 mL/hr at 04/07/12 1326 1,000 Units/hr at 04/07/12 1326  .  DISCONTD: insulin aspart (novoLOG) injection 0-9 Units  0-9 Units Subcutaneous Q4H Zannie Cove, MD   1 Units at 04/07/12 1221  . DISCONTD: metoprolol tartrate (LOPRESSOR) tablet 25 mg  25 mg Oral BID Zannie Cove, MD   25 mg at 04/07/12 1022  . DISCONTD: morphine 2 MG/ML injection 1 mg  1 mg Intravenous Q1H PRN Runell Gess, MD      . DISCONTD: potassium chloride 10 mEq in 100 mL IVPB  10 mEq Intravenous Q1 Hr x 4 Belkys A Regalado, MD   10 mEq at 04/07/12 1022  . DISCONTD: sodium chloride 0.9 % injection 3 mL  3 mL Intravenous Q12H Abelino Derrick, [place]      . DISCONTD: sodium chloride 0.9 % injection 3 mL  3 mL Intravenous PRN Abelino Derrick, PA        Physical Exam: General appearance: alert, cooperative and no distress Neck: no adenopathy, no carotid bruit, no JVD, supple, symmetrical, trachea midline and thyroid not enlarged, symmetric, no tenderness/mass/nodules Lungs: scar of lobectomy LLL Heart: regular rate and rhythm, S1, S2 normal, no murmur, click, rub or gallop Abdomen: soft, non-tender; bowel sounds normal; no masses,  no organomegaly Extremities: extremities normal, atraumatic, no cyanosis or edema and no hematoma at right radial or right femoral access sites. Pulses: 2+ and symmetric Skin: Skin color, texture, turgor normal. No rashes or lesions Neurologic: Alert and oriented X 3, normal strength and tone. Normal symmetric reflexes. Normal coordination and gait  Lab Results: Results for orders placed during the hospital encounter of 04/06/12 (from the past 48 hour(s))  CBC WITH DIFFERENTIAL     Status: Normal   Collection Time   04/06/12 11:53 PM      Component Value Range Comment   WBC 7.7  4.0 - 10.5 K/uL    RBC 4.46  3.87 - 5.11 MIL/uL    Hemoglobin 13.0  12.0 - 15.0 g/dL    HCT 16.1  09.6 - 04.5 %  MCV 82.7  78.0 - 100.0 fL    MCH 29.1  26.0 - 34.0 pg    MCHC 35.2  30.0 - 36.0 g/dL    RDW 16.1  09.6 - 04.5 %    Platelets 234  150 - 400 K/uL    Neutrophils  Relative 75  43 - 77 %    Neutro Abs 5.8  1.7 - 7.7 K/uL    Lymphocytes Relative 17  12 - 46 %    Lymphs Abs 1.3  0.7 - 4.0 K/uL    Monocytes Relative 7  3 - 12 %    Monocytes Absolute 0.5  0.1 - 1.0 K/uL    Eosinophils Relative 1  0 - 5 %    Eosinophils Absolute 0.1  0.0 - 0.7 K/uL    Basophils Relative 0  0 - 1 %    Basophils Absolute 0.0  0.0 - 0.1 K/uL   POCT I-STAT TROPONIN I     Status: Normal   Collection Time   04/07/12 12:03 AM      Component Value Range Comment   Troponin i, poc 0.04  0.00 - 0.08 ng/mL    Comment 3            POCT I-STAT, CHEM 8     Status: Abnormal   Collection Time   04/07/12 12:05 AM      Component Value Range Comment   Sodium 138  135 - 145 mEq/L    Potassium 4.1  3.5 - 5.1 mEq/L    Chloride 100  96 - 112 mEq/L    BUN 16  6 - 23 mg/dL    Creatinine, Ser 4.09  0.50 - 1.10 mg/dL    Glucose, Bld 811 (*) 70 - 99 mg/dL    Calcium, Ion 9.14  7.82 - 1.30 mmol/L    TCO2 28  0 - 100 mmol/L    Hemoglobin 12.9  12.0 - 15.0 g/dL    HCT 95.6  21.3 - 08.6 %   CARDIAC PANEL(CRET KIN+CKTOT+MB+TROPI)     Status: Abnormal   Collection Time   04/07/12  2:25 AM      Component Value Range Comment   Total CK 107  7 - 177 U/L    CK, MB 3.8  0.3 - 4.0 ng/mL    Troponin I 0.92 (*) <0.30 ng/mL    Relative Index 3.6 (*) 0.0 - 2.5   PRO B NATRIURETIC PEPTIDE     Status: Abnormal   Collection Time   04/07/12  2:25 AM      Component Value Range Comment   Pro B Natriuretic peptide (BNP) 1530.0 (*) 0 - 125 pg/mL   GLUCOSE, CAPILLARY     Status: Abnormal   Collection Time   04/07/12  3:32 AM      Component Value Range Comment   Glucose-Capillary 256 (*) 70 - 99 mg/dL   TSH     Status: Normal   Collection Time   04/07/12  6:30 AM      Component Value Range Comment   TSH 1.906  0.350 - 4.500 uIU/mL   T4, FREE     Status: Normal   Collection Time   04/07/12  6:30 AM      Component Value Range Comment   Free T4 1.41  0.80 - 1.80 ng/dL   CBC     Status: Abnormal    Collection Time   04/07/12  6:30 AM      Component Value  Range Comment   WBC 8.2  4.0 - 10.5 K/uL    RBC 4.34  3.87 - 5.11 MIL/uL    Hemoglobin 12.5  12.0 - 15.0 g/dL    HCT 16.1 (*) 09.6 - 46.0 %    MCV 82.5  78.0 - 100.0 fL    MCH 28.8  26.0 - 34.0 pg    MCHC 34.9  30.0 - 36.0 g/dL    RDW 04.5  40.9 - 81.1 %    Platelets 229  150 - 400 K/uL   BASIC METABOLIC PANEL     Status: Abnormal   Collection Time   04/07/12  6:30 AM      Component Value Range Comment   Sodium 140  135 - 145 mEq/L    Potassium 2.9 (*) 3.5 - 5.1 mEq/L    Chloride 100  96 - 112 mEq/L    CO2 29  19 - 32 mEq/L    Glucose, Bld 137 (*) 70 - 99 mg/dL    BUN 12  6 - 23 mg/dL    Creatinine, Ser 9.14  0.50 - 1.10 mg/dL    Calcium 9.8  8.4 - 78.2 mg/dL    GFR calc non Af Amer 89 (*) >90 mL/min    GFR calc Af Amer >90  >90 mL/min   GLUCOSE, CAPILLARY     Status: Abnormal   Collection Time   04/07/12  6:36 AM      Component Value Range Comment   Glucose-Capillary 122 (*) 70 - 99 mg/dL   GLUCOSE, CAPILLARY     Status: Abnormal   Collection Time   04/07/12  8:09 AM      Component Value Range Comment   Glucose-Capillary 107 (*) 70 - 99 mg/dL    Comment 1 Notify RN      Comment 2 Documented in Chart     CARDIAC PANEL(CRET KIN+CKTOT+MB+TROPI)     Status: Abnormal   Collection Time   04/07/12 11:20 AM      Component Value Range Comment   Total CK 108  7 - 177 U/L    CK, MB 4.2 (*) 0.3 - 4.0 ng/mL    Troponin I 1.46 (*) <0.30 ng/mL    Relative Index 3.9 (*) 0.0 - 2.5   HEPARIN LEVEL (UNFRACTIONATED)     Status: Abnormal   Collection Time   04/07/12 11:20 AM      Component Value Range Comment   Heparin Unfractionated 0.73 (*) 0.30 - 0.70 IU/mL   PROTIME-INR     Status: Normal   Collection Time   04/07/12 11:20 AM      Component Value Range Comment   Prothrombin Time 13.5  11.6 - 15.2 seconds    INR 1.01  0.00 - 1.49   BASIC METABOLIC PANEL     Status: Abnormal   Collection Time   04/07/12 11:20 AM      Component  Value Range Comment   Sodium 140  135 - 145 mEq/L    Potassium 3.2 (*) 3.5 - 5.1 mEq/L    Chloride 99  96 - 112 mEq/L    CO2 28  19 - 32 mEq/L    Glucose, Bld 166 (*) 70 - 99 mg/dL    BUN 12  6 - 23 mg/dL    Creatinine, Ser 9.56  0.50 - 1.10 mg/dL    Calcium 9.8  8.4 - 21.3 mg/dL    GFR calc non Af Amer 89 (*) >90 mL/min  GFR calc Af Amer >90  >90 mL/min   GLUCOSE, CAPILLARY     Status: Abnormal   Collection Time   04/07/12 11:57 AM      Component Value Range Comment   Glucose-Capillary 138 (*) 70 - 99 mg/dL    Comment 1 Notify RN      Comment 2 Documented in Chart     POCT ACTIVATED CLOTTING TIME     Status: Normal   Collection Time   04/07/12  4:33 PM      Component Value Range Comment   Activated Clotting Time 469     GLUCOSE, CAPILLARY     Status: Abnormal   Collection Time   04/07/12  5:27 PM      Component Value Range Comment   Glucose-Capillary 128 (*) 70 - 99 mg/dL   GLUCOSE, CAPILLARY     Status: Abnormal   Collection Time   04/07/12  9:54 PM      Component Value Range Comment   Glucose-Capillary 129 (*) 70 - 99 mg/dL    Comment 1 Documented in Chart      Comment 2 Notify RN     GLUCOSE, CAPILLARY     Status: Abnormal   Collection Time   04/08/12  7:56 AM      Component Value Range Comment   Glucose-Capillary 156 (*) 70 - 99 mg/dL   CBC     Status: Normal   Collection Time   04/08/12  8:10 AM      Component Value Range Comment   WBC 6.7  4.0 - 10.5 K/uL    RBC 4.42  3.87 - 5.11 MIL/uL    Hemoglobin 12.7  12.0 - 15.0 g/dL    HCT 29.5  62.1 - 30.8 %    MCV 83.5  78.0 - 100.0 fL    MCH 28.7  26.0 - 34.0 pg    MCHC 34.4  30.0 - 36.0 g/dL    RDW 65.7  84.6 - 96.2 %    Platelets 209  150 - 400 K/uL     Imaging: Dg Chest Port 1 View  04/07/2012  *RADIOLOGY REPORT*  Clinical Data: Chest pain  PORTABLE CHEST - 1 VIEW  Comparison: 11/26/2011  Findings: Low lung volumes.  Bilateral central basilar airspace disease left greater than right.  Under aerated lungs.  No  pneumothorax.  IMPRESSION: Bilateral airspace disease left greater than right.  Consider pulmonary edema, ARDS, bilateral pneumonia.  Original Report Authenticated By: Donavan Burnet, M.D.    Assessment:  1. Principal Problem: 2.  *NSTEMI (non-ST elevated myocardial infarction) 3. Active Problems: 4.  DIABETES, TYPE 2 5.  OBESITY 6.  HYPERTENSION 7.  PAF, new this admission 8.  Chest pain 9.  PVD (peripheral vascular disease), Lt SFA PTA Aug 2012 10.   Plan:  Clinically well, without symptoms or signs of ongoing coronary insufficiency or CHF. Will DC home today. Follow-up in 1-2 weeks.  Suspect anterior wall motion abnormality and decreased EF are due to ischemic stunning and will resolve. Overall injury very mild by cardiac enzymes. Add back her ACE inhibitor.  Arrhythmia was likely ischemic in etiology and hopefully will not recur. Embolism prophylaxis with ASA.clopidogrel for now. Warfarin/triple therapy risk appears to exceed benefit at this point.  Time Spent Directly with Patient:  45 minutes  Length of Stay:  LOS: 2 days    [name] 04/08/2012, 8:49 AM

## 2012-04-08 NOTE — Progress Notes (Signed)
CARDIAC REHAB PHASE I   PRE:  Rate/Rhythm: 77SR  BP:  Supine:   Sitting: 1133/52  Standing:    SaO2:   MODE:  Ambulation: 140 ft   POST:  Rate/Rhythem: 92SR  BP:  Supine:   Sitting: 141/48  Standing:     SaO2:   0745-0830 Pt walked 140 ft on RA with rolling walker. Uses cane at home. Difficulty standing upright due to knee problems. Walks little at home per pt. CRP 2 NA due to mobility issues. Education completed. Reviewed diabetic diet and counting carbs. Encouraged pt to watch salt. No CP with walk. Back to sitting on side of bed after walk. [name]

## 2012-04-08 NOTE — Discharge Summary (Signed)
Patient ID: [name],  MRN: [id number], DOB/AGE: April 14, 1951 61 y.o.  Admit date: 04/06/2012 Discharge date: 04/08/2012  Primary Care Provider:  Primary Cardiologist: Dr Royann Shivers  Discharge Diagnoses Principal Problem:  *NSTEMI (non-ST elevated myocardial infarction) Active Problems:  CAD (coronary artery disease), LAD DES 04/07/12  PAF, new this admission  Ischemic cardiomyopathy, EF 40-45% cath 04/07/12  ADENOCARCINOMA, LUNG, LEFT VATS, LUL lobectomy May 2010  DIABETES, TYPE 2  OBESITY  HYPERTENSION  PVD (peripheral vascular disease), Lt SFA PTA Aug 2012    Procedures: Cath/ LAD DES 04/07/12   Hospital Course This is a 61 y.o. female with a past medical history significant forDM, HTN, dyslipidemia, morbid obesity, and minor CAD by cath in Feb 2010. She also has PVD and had Lt SFA PTA Aug 2012. She was just seen in the office 04/06/12 with complaints of post parandial chest pain and palpitations. She recently had an endoscopy by Dr [name] which was unremarkable. Her EKG reportedly had some subtile changes and she was set up for an OP cath. On 04/06/12  she developed SSCP and tachycardia. It's reported she was in rapid AF by EMS and initially in the ER. She is now in NSR. Her Troponin is .92. She underwent cardiac cath 04/07/12 which showed a 99% LAD. She underwent elective LAD DES by Dr [name]. She does has an EF of 40-45%. Dr Royann Shivers saw her on the morning of the 13th and felt she could be discharged home. We are hopeful her EF will recover. Dr Royann Shivers feels her PAF may have been ischemic mediated and hopefully will not recurr. He does not think Coumadin is indicated at this time.    Discharge Vitals:  Blood pressure 141/48, pulse 75, temperature 98 F (36.7 C), temperature source Oral, resp. rate 21, height 5\' 3"  (1.6 m), weight 111.177 kg (245 lb 1.6 oz), SpO2 100.00%.    Labs: Results for orders placed during the hospital encounter of 04/06/12 (from the past 48 hour(s))  CBC  WITH DIFFERENTIAL     Status: Normal   Collection Time   04/06/12 11:53 PM      Component Value Range Comment   WBC 7.7  4.0 - 10.5 K/uL    RBC 4.46  3.87 - 5.11 MIL/uL    Hemoglobin 13.0  12.0 - 15.0 g/dL    HCT 40.9  81.1 - 91.4 %    MCV 82.7  78.0 - 100.0 fL    MCH 29.1  26.0 - 34.0 pg    MCHC 35.2  30.0 - 36.0 g/dL    RDW 78.2  95.6 - 21.3 %    Platelets 234  150 - 400 K/uL    Neutrophils Relative 75  43 - 77 %    Neutro Abs 5.8  1.7 - 7.7 K/uL    Lymphocytes Relative 17  12 - 46 %    Lymphs Abs 1.3  0.7 - 4.0 K/uL    Monocytes Relative 7  3 - 12 %    Monocytes Absolute 0.5  0.1 - 1.0 K/uL    Eosinophils Relative 1  0 - 5 %    Eosinophils Absolute 0.1  0.0 - 0.7 K/uL    Basophils Relative 0  0 - 1 %    Basophils Absolute 0.0  0.0 - 0.1 K/uL   POCT I-STAT TROPONIN I     Status: Normal   Collection Time   04/07/12 12:03 AM      Component Value Range Comment  Troponin i, poc 0.04  0.00 - 0.08 ng/mL    Comment 3            POCT I-STAT, CHEM 8     Status: Abnormal   Collection Time   04/07/12 12:05 AM      Component Value Range Comment   Sodium 138  135 - 145 mEq/L    Potassium 4.1  3.5 - 5.1 mEq/L    Chloride 100  96 - 112 mEq/L    BUN 16  6 - 23 mg/dL    Creatinine, Ser 1.61  0.50 - 1.10 mg/dL    Glucose, Bld 096 (*) 70 - 99 mg/dL    Calcium, Ion 0.45  4.09 - 1.30 mmol/L    TCO2 28  0 - 100 mmol/L    Hemoglobin 12.9  12.0 - 15.0 g/dL    HCT 81.1  91.4 - 78.2 %   CARDIAC PANEL(CRET KIN+CKTOT+MB+TROPI)     Status: Abnormal   Collection Time   04/07/12  2:25 AM      Component Value Range Comment   Total CK 107  7 - 177 U/L    CK, MB 3.8  0.3 - 4.0 ng/mL    Troponin I 0.92 (*) <0.30 ng/mL    Relative Index 3.6 (*) 0.0 - 2.5   PRO B NATRIURETIC PEPTIDE     Status: Abnormal   Collection Time   04/07/12  2:25 AM      Component Value Range Comment   Pro B Natriuretic peptide (BNP) 1530.0 (*) 0 - 125 pg/mL   GLUCOSE, CAPILLARY     Status: Abnormal   Collection Time    04/07/12  3:32 AM      Component Value Range Comment   Glucose-Capillary 256 (*) 70 - 99 mg/dL   TSH     Status: Normal   Collection Time   04/07/12  6:30 AM      Component Value Range Comment   TSH 1.906  0.350 - 4.500 uIU/mL   T4, FREE     Status: Normal   Collection Time   04/07/12  6:30 AM      Component Value Range Comment   Free T4 1.41  0.80 - 1.80 ng/dL   CBC     Status: Abnormal   Collection Time   04/07/12  6:30 AM      Component Value Range Comment   WBC 8.2  4.0 - 10.5 K/uL    RBC 4.34  3.87 - 5.11 MIL/uL    Hemoglobin 12.5  12.0 - 15.0 g/dL    HCT 95.6 (*) 21.3 - 46.0 %    MCV 82.5  78.0 - 100.0 fL    MCH 28.8  26.0 - 34.0 pg    MCHC 34.9  30.0 - 36.0 g/dL    RDW 08.6  57.8 - 46.9 %    Platelets 229  150 - 400 K/uL   BASIC METABOLIC PANEL     Status: Abnormal   Collection Time   04/07/12  6:30 AM      Component Value Range Comment   Sodium 140  135 - 145 mEq/L    Potassium 2.9 (*) 3.5 - 5.1 mEq/L    Chloride 100  96 - 112 mEq/L    CO2 29  19 - 32 mEq/L    Glucose, Bld 137 (*) 70 - 99 mg/dL    BUN 12  6 - 23 mg/dL    Creatinine, Ser 6.29  0.50 -  1.10 mg/dL    Calcium 9.8  8.4 - 09.8 mg/dL    GFR calc non Af Amer 89 (*) >90 mL/min    GFR calc Af Amer >90  >90 mL/min   GLUCOSE, CAPILLARY     Status: Abnormal   Collection Time   04/07/12  6:36 AM      Component Value Range Comment   Glucose-Capillary 122 (*) 70 - 99 mg/dL   GLUCOSE, CAPILLARY     Status: Abnormal   Collection Time   04/07/12  8:09 AM      Component Value Range Comment   Glucose-Capillary 107 (*) 70 - 99 mg/dL    Comment 1 Notify RN      Comment 2 Documented in Chart     CARDIAC PANEL(CRET KIN+CKTOT+MB+TROPI)     Status: Abnormal   Collection Time   04/07/12 11:20 AM      Component Value Range Comment   Total CK 108  7 - 177 U/L    CK, MB 4.2 (*) 0.3 - 4.0 ng/mL    Troponin I 1.46 (*) <0.30 ng/mL    Relative Index 3.9 (*) 0.0 - 2.5   HEPARIN LEVEL (UNFRACTIONATED)     Status: Abnormal    Collection Time   04/07/12 11:20 AM      Component Value Range Comment   Heparin Unfractionated 0.73 (*) 0.30 - 0.70 IU/mL   PROTIME-INR     Status: Normal   Collection Time   04/07/12 11:20 AM      Component Value Range Comment   Prothrombin Time 13.5  11.6 - 15.2 seconds    INR 1.01  0.00 - 1.49   BASIC METABOLIC PANEL     Status: Abnormal   Collection Time   04/07/12 11:20 AM      Component Value Range Comment   Sodium 140  135 - 145 mEq/L    Potassium 3.2 (*) 3.5 - 5.1 mEq/L    Chloride 99  96 - 112 mEq/L    CO2 28  19 - 32 mEq/L    Glucose, Bld 166 (*) 70 - 99 mg/dL    BUN 12  6 - 23 mg/dL    Creatinine, Ser 1.19  0.50 - 1.10 mg/dL    Calcium 9.8  8.4 - 14.7 mg/dL    GFR calc non Af Amer 89 (*) >90 mL/min    GFR calc Af Amer >90  >90 mL/min   GLUCOSE, CAPILLARY     Status: Abnormal   Collection Time   04/07/12 11:57 AM      Component Value Range Comment   Glucose-Capillary 138 (*) 70 - 99 mg/dL    Comment 1 Notify RN      Comment 2 Documented in Chart     POCT ACTIVATED CLOTTING TIME     Status: Normal   Collection Time   04/07/12  4:33 PM      Component Value Range Comment   Activated Clotting Time 469     GLUCOSE, CAPILLARY     Status: Abnormal   Collection Time   04/07/12  5:27 PM      Component Value Range Comment   Glucose-Capillary 128 (*) 70 - 99 mg/dL   GLUCOSE, CAPILLARY     Status: Abnormal   Collection Time   04/07/12  9:54 PM      Component Value Range Comment   Glucose-Capillary 129 (*) 70 - 99 mg/dL    Comment 1 Documented in Chart  Comment 2 Notify RN     GLUCOSE, CAPILLARY     Status: Abnormal   Collection Time   04/08/12  7:56 AM      Component Value Range Comment   Glucose-Capillary 156 (*) 70 - 99 mg/dL   CBC     Status: Normal   Collection Time   04/08/12  8:10 AM      Component Value Range Comment   WBC 6.7  4.0 - 10.5 K/uL    RBC 4.42  3.87 - 5.11 MIL/uL    Hemoglobin 12.7  12.0 - 15.0 g/dL    HCT 27.2  53.6 - 64.4 %    MCV 83.5  78.0  - 100.0 fL    MCH 28.7  26.0 - 34.0 pg    MCHC 34.4  30.0 - 36.0 g/dL    RDW 03.4  74.2 - 59.5 %    Platelets 209  150 - 400 K/uL   BASIC METABOLIC PANEL     Status: Abnormal   Collection Time   04/08/12  8:10 AM      Component Value Range Comment   Sodium 138  135 - 145 mEq/L    Potassium 3.5  3.5 - 5.1 mEq/L    Chloride 100  96 - 112 mEq/L    CO2 29  19 - 32 mEq/L    Glucose, Bld 169 (*) 70 - 99 mg/dL    BUN 13  6 - 23 mg/dL    Creatinine, Ser 6.38  0.50 - 1.10 mg/dL    Calcium 9.5  8.4 - 75.6 mg/dL    GFR calc non Af Amer 75 (*) >90 mL/min    GFR calc Af Amer 87 (*) >90 mL/min   MAGNESIUM     Status: Normal   Collection Time   04/08/12  8:10 AM      Component Value Range Comment   Magnesium 1.6  1.5 - 2.5 mg/dL     Disposition:    Discharge Medications:  Medication List  As of 04/08/2012 10:25 AM   ASK your doctor about these medications         aspirin 81 MG tablet   Take 81 mg by mouth daily.      clopidogrel 75 MG tablet   Commonly known as: PLAVIX   Take 75 mg by mouth daily.      insulin NPH 100 UNIT/ML injection   Commonly known as: HUMULIN N,NOVOLIN N   Inject 5-10 Units into the skin at bedtime. Sliding scale      insulin regular 100 units/mL injection   Commonly known as: NOVOLIN R,HUMULIN R   Inject 5 Units into the skin 3 (three) times daily before meals.      lisinopril-hydrochlorothiazide 20-25 MG per tablet   Commonly known as: PRINZIDE,ZESTORETIC   Take 1 tablet by mouth daily.      metoCLOPramide 10 MG tablet   Commonly known as: REGLAN   Take 10 mg by mouth 4 (four) times daily - after meals and at bedtime. Nausea      pantoprazole 40 MG tablet   Commonly known as: PROTONIX   Take 40 mg by mouth daily.      simvastatin 40 MG tablet   Commonly known as: ZOCOR   Take 40 mg by mouth every evening.      VITAMIN D PO   Take 2,000 Units by mouth daily.             Duration of Discharge  Encounter: Greater than 30 minutes including  physician time.  Jolene Provost PA-C 04/08/2012 10:25 AM

## 2012-04-10 MED FILL — Dextrose Inj 5%: INTRAVENOUS | Qty: 50 | Status: AC

## 2012-04-14 ENCOUNTER — Encounter (HOSPITAL_COMMUNITY): Payer: Self-pay

## 2012-04-14 ENCOUNTER — Ambulatory Visit (HOSPITAL_COMMUNITY): Admit: 2012-04-14 | Payer: Self-pay | Admitting: Cardiovascular Disease

## 2012-04-14 DIAGNOSIS — I2510 Atherosclerotic heart disease of native coronary artery without angina pectoris: Secondary | ICD-10-CM | POA: Diagnosis not present

## 2012-04-14 SURGERY — LEFT HEART CATHETERIZATION WITH CORONARY ANGIOGRAM
Anesthesia: LOCAL

## 2012-04-19 DIAGNOSIS — R002 Palpitations: Secondary | ICD-10-CM | POA: Diagnosis not present

## 2012-04-30 ENCOUNTER — Emergency Department (HOSPITAL_COMMUNITY)
Admission: EM | Admit: 2012-04-30 | Discharge: 2012-04-30 | Disposition: A | Discharge status: Home / Self Care | Payer: Medicare Other | Attending: Emergency Medicine | Admitting: Emergency Medicine

## 2012-04-30 ENCOUNTER — Encounter (HOSPITAL_COMMUNITY): Payer: Self-pay | Admitting: *Deleted

## 2012-04-30 ENCOUNTER — Emergency Department (HOSPITAL_COMMUNITY): Payer: Medicare Other

## 2012-04-30 DIAGNOSIS — G4730 Sleep apnea, unspecified: Secondary | ICD-10-CM | POA: Insufficient documentation

## 2012-04-30 DIAGNOSIS — E119 Type 2 diabetes mellitus without complications: Secondary | ICD-10-CM | POA: Insufficient documentation

## 2012-04-30 DIAGNOSIS — Z85118 Personal history of other malignant neoplasm of bronchus and lung: Secondary | ICD-10-CM | POA: Diagnosis not present

## 2012-04-30 DIAGNOSIS — R059 Cough, unspecified: Secondary | ICD-10-CM | POA: Diagnosis not present

## 2012-04-30 DIAGNOSIS — R05 Cough: Secondary | ICD-10-CM | POA: Diagnosis not present

## 2012-04-30 DIAGNOSIS — I252 Old myocardial infarction: Secondary | ICD-10-CM | POA: Insufficient documentation

## 2012-04-30 DIAGNOSIS — Z79899 Other long term (current) drug therapy: Secondary | ICD-10-CM | POA: Insufficient documentation

## 2012-04-30 DIAGNOSIS — Z794 Long term (current) use of insulin: Secondary | ICD-10-CM | POA: Insufficient documentation

## 2012-04-30 DIAGNOSIS — Z9089 Acquired absence of other organs: Secondary | ICD-10-CM | POA: Insufficient documentation

## 2012-04-30 DIAGNOSIS — J40 Bronchitis, not specified as acute or chronic: Secondary | ICD-10-CM

## 2012-04-30 DIAGNOSIS — I517 Cardiomegaly: Secondary | ICD-10-CM | POA: Diagnosis not present

## 2012-04-30 DIAGNOSIS — J209 Acute bronchitis, unspecified: Secondary | ICD-10-CM | POA: Diagnosis not present

## 2012-04-30 DIAGNOSIS — Z8739 Personal history of other diseases of the musculoskeletal system and connective tissue: Secondary | ICD-10-CM | POA: Insufficient documentation

## 2012-04-30 DIAGNOSIS — E7800 Pure hypercholesterolemia, unspecified: Secondary | ICD-10-CM | POA: Diagnosis not present

## 2012-04-30 DIAGNOSIS — R109 Unspecified abdominal pain: Secondary | ICD-10-CM | POA: Diagnosis not present

## 2012-04-30 DIAGNOSIS — Z7982 Long term (current) use of aspirin: Secondary | ICD-10-CM | POA: Diagnosis not present

## 2012-04-30 DIAGNOSIS — I10 Essential (primary) hypertension: Secondary | ICD-10-CM | POA: Diagnosis not present

## 2012-04-30 LAB — GLUCOSE, CAPILLARY: Glucose-Capillary: 252 mg/dL — ABNORMAL HIGH (ref 70–99)

## 2012-04-30 LAB — POCT I-STAT TROPONIN I: Troponin i, poc: 0 ng/mL (ref 0.00–0.08)

## 2012-04-30 LAB — BASIC METABOLIC PANEL
Chloride: 93 mEq/L — ABNORMAL LOW (ref 96–112)
GFR calc non Af Amer: >90 mL/min (ref >90)
Glucose, Bld: 232 mg/dL — ABNORMAL HIGH (ref 70–99)
Potassium: 3.5 mEq/L (ref 3.5–5.1)
Sodium: 132 mEq/L — ABNORMAL LOW (ref 135–145)

## 2012-04-30 LAB — CBC
HCT: 37.1 % (ref 36.0–46.0)
Hemoglobin: 12.7 g/dL (ref 12.0–15.0)
MCH: 28.9 pg (ref 26.0–34.0)
RBC: 4.4 MIL/uL (ref 3.87–5.11)

## 2012-04-30 MED ORDER — GUAIFENESIN ER 1200 MG PO TB12: 1.0000 | ORAL_TABLET | Freq: Two times a day (BID) | ORAL | Status: DC

## 2012-04-30 MED ORDER — IPRATROPIUM BROMIDE 0.02 % IN SOLN
0.5000 mg | Freq: Once | RESPIRATORY_TRACT | Status: AC
  Administered 2012-04-30: 0.5 mg via RESPIRATORY_TRACT
  Filled 2012-04-30: qty 2.5

## 2012-04-30 MED ORDER — ALBUTEROL SULFATE HFA 108 (90 BASE) MCG/ACT IN AERS: 1.0000 | INHALATION_SPRAY | Freq: Four times a day (QID) | RESPIRATORY_TRACT | Status: DC | PRN

## 2012-04-30 MED ORDER — BENZONATATE 100 MG PO CAPS: 100.0000 mg | ORAL_CAPSULE | Freq: Three times a day (TID) | ORAL | Status: AC

## 2012-04-30 MED ORDER — ALBUTEROL SULFATE (5 MG/ML) 0.5% IN NEBU
5.0000 mg | INHALATION_SOLUTION | Freq: Once | RESPIRATORY_TRACT | Status: AC
  Administered 2012-04-30: 5 mg via RESPIRATORY_TRACT
  Filled 2012-04-30 (×2): qty 0.5

## 2012-04-30 NOTE — ED Notes (Signed)
Patient is alert and oriented x3.  She is complaining of a cough with abdominal pain that started last week. Currently she rates her pain at a 5 of 10.  She states she just "feels bad".  She denies any nausea or vomiting.

## 2012-04-30 NOTE — ED Notes (Signed)
Pt is main complaint is generalize weakness onset 1 week ago with coughing and upper abd discomfort. Pt alert, oriented, no N/V/D, no dizziness. Pt denied any other symptoms.

## 2012-04-30 NOTE — ED Provider Notes (Signed)
History     CSN: [id number] Arrival date & time 04/30/12  1040  First MD Initiated Contact with Patient 04/30/12 1041      Chief Complaint  Patient presents with  . Cough  . Abdominal Pain    Patient is a 61 y.o. female presenting with cough. The history is provided by the patient.  Cough This is a new problem. The current episode started more than 1 week ago. The problem occurs constantly. The problem has been gradually worsening. The cough is non-productive. There has been no fever. Associated symptoms include chest pain, sore throat and myalgias. Pertinent negatives include no chills and no shortness of breath. She has tried nothing for the symptoms. The treatment provided no relief. She is not a smoker. Her past medical history does not include pneumonia. Past medical history comments: Pt has history of Lung CA s/p resection in 2010.  No chemo needed..  Pt denies abdominal pain.  States it is hurting in the lower chest.  No vomiting or diarrhea.    History of heart problems.  This does not feel similar.   Past Medical History  Diagnosis Date  . Hypertension   . Hypercholesteremia   . Cancer   . Lung cancer   . Peripheral vascular disease   . Dysrhythmia 04/07/2012    atrial fib  . Sleep apnea     " mild form ,does not use cpap "  . Diabetes mellitus     insulin dependent  . GERD (gastroesophageal reflux disease)   . H/O hiatal hernia   . Arthritis   . MI (myocardial infarction)   . Stented coronary artery     Past Surgical History  Procedure Date  . Abdominal hysterectomy   . Cholecystectomy   . Left knee replacement   . Lung surgery     Family History  Problem Relation Age of Onset  . Hypertension Mother   . Stroke Father   . Coronary artery disease Brother     History  Substance Use Topics  . Smoking status: Former Smoker -- 1.0 packs/day for 20 years    Types: Cigarettes    Quit date: 09/27/1984  . Smokeless tobacco: Never Used  . Alcohol Use: No     OB History    Grav Para Term Preterm Abortions TAB SAB Ect Mult Living                  Review of Systems  Constitutional: Negative for chills.  HENT: Positive for sore throat.   Respiratory: Positive for cough. Negative for shortness of breath.   Cardiovascular: Positive for chest pain.  Gastrointestinal: Negative for abdominal pain.  Musculoskeletal: Positive for myalgias.       Chronic calf pain, negative dvt study in the past  All other systems reviewed and are negative.    Allergies  Levofloxacin and Morphine and related  Home Medications   Current Outpatient Rx  Name Route Sig Dispense Refill  . ASPIRIN 81 MG PO TABS Oral Take 81 mg by mouth daily.      Marland Kitchen VITAMIN D PO Oral Take 2,000 Units by mouth daily.      Marland Kitchen CLOPIDOGREL BISULFATE 75 MG PO TABS Oral Take 75 mg by mouth daily.      . INSULIN ISOPHANE HUMAN 100 UNIT/ML [place] SUSP Subcutaneous Inject 5-10 Units into the skin at bedtime. Sliding scale    . INSULIN REGULAR HUMAN 100 UNIT/ML IJ SOLN Subcutaneous Inject 5 Units into the skin  3 (three) times daily before meals.    Marland Kitchen LISINOPRIL-HYDROCHLOROTHIAZIDE 20-25 MG PO TABS Oral Take 1 tablet by mouth daily.      Marland Kitchen METOCLOPRAMIDE HCL 10 MG PO TABS Oral Take 10 mg by mouth 4 (four) times daily - after meals and at bedtime. Nausea    . METOPROLOL TARTRATE 12.5 MG HALF TABLET Oral Take 0.5 tablets (12.5 mg total) by mouth 2 (two) times daily. 60 tablet 11  . PANTOPRAZOLE SODIUM 40 MG PO TBEC Oral Take 40 mg by mouth daily.    Marland Kitchen SIMVASTATIN 40 MG PO TABS Oral Take 40 mg by mouth every evening.      BP 136/51  Pulse 75  Temp 98.8 F (37.1 C) (Oral)  Resp 18  SpO2 98%  Physical Exam  Nursing note and vitals reviewed. Constitutional: She appears well-developed and well-nourished. No distress.  HENT:  Head: Normocephalic and atraumatic.  Right Ear: External ear normal.  Left Ear: External ear normal.  Eyes: Conjunctivae are normal. Right eye exhibits no discharge.  Left eye exhibits no discharge. No scleral icterus.  Neck: Neck supple. No tracheal deviation present.  Cardiovascular: Normal rate, regular rhythm and intact distal pulses.   Pulmonary/Chest: Effort normal. No stridor. No respiratory distress. She has no wheezes. She has rales (few crackles left base).  Abdominal: Soft. Bowel sounds are normal. She exhibits no distension. There is no tenderness. There is no rebound and no guarding.  Musculoskeletal: She exhibits tenderness (mild ttp bilateral calf muscles, warm no erythema or edema). She exhibits no edema.  Neurological: She is alert. She has normal strength. No sensory deficit. Cranial nerve deficit:  no gross defecits noted. She exhibits normal muscle tone. She displays no seizure activity. Coordination normal.  Skin: Skin is warm and dry. No rash noted.  Psychiatric: She has a normal mood and affect.    ED Course  Procedures (including critical care time)  Rate: 67  Rhythm: normal sinus rhythm  QRS Axis: normal  Intervals: normal  ST/T Wave abnormalities: nonspecific st changes  Conduction Disutrbances:nonspecific intraventricular conduction delay  Narrative Interpretation:   Old EKG Reviewed: none available  Labs Reviewed  BASIC METABOLIC PANEL - Abnormal; Notable for the following:    Sodium 132 (*)     Chloride 93 (*)     Glucose, Bld 232 (*)     All other components within normal limits  GLUCOSE, CAPILLARY - Abnormal; Notable for the following:    Glucose-Capillary 252 (*)     All other components within normal limits  CBC  POCT I-STAT TROPONIN I   Dg Chest 2 View  04/30/2012  *RADIOLOGY REPORT*  Clinical Data: Cough and abdominal pain  CHEST - 2 VIEW  Comparison: 04/07/2012  Findings: Moderate cardiac enlargement.  Postoperative change involving the left hemithorax is noted with associated volume loss. No pleural effusion or edema identified.  No airspace consolidation.  IMPRESSION:  1.  No acute findings. 2.  Cardiac  enlargement 3.  Postop changes involving the left lung, stable from previous exam.  Original Report Authenticated By: Rosealee Albee, M.D.     1. Bronchitis       MDM  Pt's symptoms suggest an infectious nature, likely viral uri. Her symptoms do not sound cardiac in nature. She's not having any shortness of breath or tachypnea.   I doubt pulmonary embolism. The patient will be treated with symptomatic medications. She will followup with her doctor next week if symptoms persist. She is to return  for worsening symptoms        Celene Kras, MD 04/30/12 1315

## 2012-05-01 ENCOUNTER — Ambulatory Visit: Payer: Medicare Other | Admitting: Cardiovascular Disease

## 2012-05-08 DIAGNOSIS — Z7901 Long term (current) use of anticoagulants: Secondary | ICD-10-CM | POA: Diagnosis not present

## 2012-05-08 DIAGNOSIS — I4891 Unspecified atrial fibrillation: Secondary | ICD-10-CM | POA: Diagnosis not present

## 2012-05-15 DIAGNOSIS — I4891 Unspecified atrial fibrillation: Secondary | ICD-10-CM | POA: Diagnosis not present

## 2012-05-15 DIAGNOSIS — I2510 Atherosclerotic heart disease of native coronary artery without angina pectoris: Secondary | ICD-10-CM | POA: Diagnosis not present

## 2012-05-22 DIAGNOSIS — H908 Mixed conductive and sensorineural hearing loss, unspecified: Secondary | ICD-10-CM | POA: Diagnosis not present

## 2012-05-22 DIAGNOSIS — I10 Essential (primary) hypertension: Secondary | ICD-10-CM | POA: Diagnosis not present

## 2012-05-22 DIAGNOSIS — I4891 Unspecified atrial fibrillation: Secondary | ICD-10-CM | POA: Diagnosis not present

## 2012-05-22 DIAGNOSIS — E782 Mixed hyperlipidemia: Secondary | ICD-10-CM | POA: Diagnosis not present

## 2012-05-22 DIAGNOSIS — M159 Polyosteoarthritis, unspecified: Secondary | ICD-10-CM | POA: Diagnosis not present

## 2012-05-22 DIAGNOSIS — K2100 Gastro-esophageal reflux disease with esophagitis, without bleeding: Secondary | ICD-10-CM | POA: Diagnosis not present

## 2012-05-25 DIAGNOSIS — Z7901 Long term (current) use of anticoagulants: Secondary | ICD-10-CM | POA: Diagnosis not present

## 2012-05-25 DIAGNOSIS — I4891 Unspecified atrial fibrillation: Secondary | ICD-10-CM | POA: Diagnosis not present

## 2012-06-01 DIAGNOSIS — R195 Other fecal abnormalities: Secondary | ICD-10-CM | POA: Diagnosis not present

## 2012-06-01 DIAGNOSIS — I10 Essential (primary) hypertension: Secondary | ICD-10-CM | POA: Diagnosis not present

## 2012-06-01 DIAGNOSIS — I4891 Unspecified atrial fibrillation: Secondary | ICD-10-CM | POA: Diagnosis not present

## 2012-06-01 DIAGNOSIS — Z1212 Encounter for screening for malignant neoplasm of rectum: Secondary | ICD-10-CM | POA: Diagnosis not present

## 2012-06-01 DIAGNOSIS — E782 Mixed hyperlipidemia: Secondary | ICD-10-CM | POA: Diagnosis not present

## 2012-06-02 DIAGNOSIS — I70219 Atherosclerosis of native arteries of extremities with intermittent claudication, unspecified extremity: Secondary | ICD-10-CM | POA: Diagnosis not present

## 2012-06-02 DIAGNOSIS — I739 Peripheral vascular disease, unspecified: Secondary | ICD-10-CM | POA: Diagnosis not present

## 2012-06-07 DIAGNOSIS — Z7901 Long term (current) use of anticoagulants: Secondary | ICD-10-CM | POA: Diagnosis not present

## 2012-06-07 DIAGNOSIS — I4891 Unspecified atrial fibrillation: Secondary | ICD-10-CM | POA: Diagnosis not present

## 2012-06-19 DIAGNOSIS — E782 Mixed hyperlipidemia: Secondary | ICD-10-CM | POA: Diagnosis not present

## 2012-06-19 DIAGNOSIS — I10 Essential (primary) hypertension: Secondary | ICD-10-CM | POA: Diagnosis not present

## 2012-06-19 DIAGNOSIS — I4891 Unspecified atrial fibrillation: Secondary | ICD-10-CM | POA: Diagnosis not present

## 2012-06-19 DIAGNOSIS — E1065 Type 1 diabetes mellitus with hyperglycemia: Secondary | ICD-10-CM | POA: Diagnosis not present

## 2012-06-19 DIAGNOSIS — K2100 Gastro-esophageal reflux disease with esophagitis, without bleeding: Secondary | ICD-10-CM | POA: Diagnosis not present

## 2012-06-26 ENCOUNTER — Emergency Department (HOSPITAL_COMMUNITY)
Admission: EM | Admit: 2012-06-26 | Discharge: 2012-06-26 | Disposition: A | Discharge status: Home / Self Care | Payer: Medicare Other | Attending: Emergency Medicine | Admitting: Emergency Medicine

## 2012-06-26 ENCOUNTER — Encounter (HOSPITAL_COMMUNITY): Payer: Self-pay | Admitting: *Deleted

## 2012-06-26 DIAGNOSIS — Z794 Long term (current) use of insulin: Secondary | ICD-10-CM | POA: Diagnosis not present

## 2012-06-26 DIAGNOSIS — I4891 Unspecified atrial fibrillation: Secondary | ICD-10-CM | POA: Diagnosis not present

## 2012-06-26 DIAGNOSIS — Z79899 Other long term (current) drug therapy: Secondary | ICD-10-CM | POA: Diagnosis not present

## 2012-06-26 DIAGNOSIS — I10 Essential (primary) hypertension: Secondary | ICD-10-CM | POA: Insufficient documentation

## 2012-06-26 DIAGNOSIS — K219 Gastro-esophageal reflux disease without esophagitis: Secondary | ICD-10-CM | POA: Insufficient documentation

## 2012-06-26 DIAGNOSIS — E7800 Pure hypercholesterolemia, unspecified: Secondary | ICD-10-CM | POA: Diagnosis not present

## 2012-06-26 DIAGNOSIS — E119 Type 2 diabetes mellitus without complications: Secondary | ICD-10-CM | POA: Diagnosis not present

## 2012-06-26 DIAGNOSIS — G4730 Sleep apnea, unspecified: Secondary | ICD-10-CM | POA: Insufficient documentation

## 2012-06-26 DIAGNOSIS — Z87891 Personal history of nicotine dependence: Secondary | ICD-10-CM | POA: Diagnosis not present

## 2012-06-26 DIAGNOSIS — R6889 Other general symptoms and signs: Secondary | ICD-10-CM | POA: Diagnosis not present

## 2012-06-26 DIAGNOSIS — M129 Arthropathy, unspecified: Secondary | ICD-10-CM | POA: Insufficient documentation

## 2012-06-26 DIAGNOSIS — Z9861 Coronary angioplasty status: Secondary | ICD-10-CM | POA: Insufficient documentation

## 2012-06-26 DIAGNOSIS — Z85118 Personal history of other malignant neoplasm of bronchus and lung: Secondary | ICD-10-CM | POA: Insufficient documentation

## 2012-06-26 DIAGNOSIS — I252 Old myocardial infarction: Secondary | ICD-10-CM | POA: Insufficient documentation

## 2012-06-26 DIAGNOSIS — R002 Palpitations: Secondary | ICD-10-CM | POA: Diagnosis not present

## 2012-06-26 LAB — CBC
Platelets: 189 10*3/uL (ref 150–400)
RDW: 13 % (ref 11.5–15.5)
WBC: 6.2 10*3/uL (ref 4.0–10.5)

## 2012-06-26 LAB — BASIC METABOLIC PANEL
Calcium: 9.9 mg/dL (ref 8.4–10.5)
Chloride: 101 mEq/L (ref 96–112)
Creatinine, Ser: 0.67 mg/dL (ref 0.50–1.10)
GFR calc Af Amer: >90 mL/min (ref >90)

## 2012-06-26 LAB — POCT I-STAT TROPONIN I: Troponin i, poc: 0.02 ng/mL (ref 0.00–0.08)

## 2012-06-26 NOTE — ED Notes (Signed)
Pt states that she was in the bathroom "washing off" when she noticed that it felt like her heart was racing.  She contacted 911 and [facility] arrived to find in SR.  She denies Chest pain or sob.  But she does state that she feels week.  She has a hx of cardiac stents placed here at [facility] July 12th, 2013.  Her CBG PTA was 212.  No distress noted at the present.  Though she denies any symptoms at the present.

## 2012-06-26 NOTE — ED Notes (Signed)
Phlebotomy at the bedside  

## 2012-06-26 NOTE — ED Provider Notes (Signed)
History     CSN: [id number]  Arrival date & time 06/26/12  1239   First MD Initiated Contact with Patient 06/26/12 1312      Chief Complaint  Patient presents with  . Feels like her heart is "racing"     HPI The patient presented to the emergency room today with complaints of palpitations.  The patient felt like her heart was thumping. She states it occurred while she was washing off in the bathroom this morning. It lasted for about 5 minutes. Patient was concerned and called 911. By the time they arrived her symptoms have resolved and they found her to be in a normal sinus rhythm. She states she did have some generalized weakness with it. I actually seen in the emergency room the last week or so for symptoms associated with cough and congestion. She states the symptoms have subsequently resolved. She denies any chest pain or shortness of breath. She has not had any vomiting or diarrhea. Patient does have history of coronary artery disease and had cardiac stents placed back in July. She's not having any symptoms that she feels is similar to that episode. Past Medical History  Diagnosis Date  . Hypertension   . Hypercholesteremia   . Cancer   . Lung cancer   . Peripheral vascular disease   . Dysrhythmia 04/07/2012    atrial fib  . Sleep apnea     " mild form ,does not use cpap "  . Diabetes mellitus     insulin dependent  . GERD (gastroesophageal reflux disease)   . H/O hiatal hernia   . Arthritis   . MI (myocardial infarction)   . Stented coronary artery   . Atrial fibrillation     Past Surgical History  Procedure Date  . Abdominal hysterectomy   . Cholecystectomy   . Left knee replacement   . Lung surgery     Family History  Problem Relation Age of Onset  . Hypertension Mother   . Coronary artery disease Brother     History  Substance Use Topics  . Smoking status: Former Smoker -- 1.0 packs/day for 20 years    Types: Cigarettes    Quit date: 09/27/1984  .  Smokeless tobacco: Never Used  . Alcohol Use: No    OB History    Grav Para Term Preterm Abortions TAB SAB Ect Mult Living                  Review of Systems  All other systems reviewed and are negative.    Allergies  Levofloxacin and Morphine and related  Home Medications   Current Outpatient Rx  Name Route Sig Dispense Refill  . ALBUTEROL SULFATE HFA 108 (90 BASE) MCG/ACT IN AERS Inhalation Inhale 1-2 puffs into the lungs every 6 (six) hours as needed for wheezing. 1 Inhaler 0  . ASPIRIN EC 81 MG PO TBEC Oral Take 81 mg by mouth daily.    Marland Kitchen VITAMIN D PO Oral Take 2,000 Units by mouth daily.      Marland Kitchen CLOPIDOGREL BISULFATE 75 MG PO TABS Oral Take 75 mg by mouth daily.      . GUAIFENESIN ER 1200 MG PO TB12 Oral Take 1 tablet (1,200 mg total) by mouth 2 (two) times daily at 10 AM and 5 PM. 14 each 0  . INSULIN ISOPHANE HUMAN 100 UNIT/ML [place] SUSP Subcutaneous Inject 5-10 Units into the skin at bedtime. Sliding scale    . INSULIN REGULAR HUMAN 100  UNIT/ML IJ SOLN Subcutaneous Inject 5 Units into the skin 3 (three) times daily before meals.    Marland Kitchen LISINOPRIL-HYDROCHLOROTHIAZIDE 20-25 MG PO TABS Oral Take 1 tablet by mouth daily.      Marland Kitchen METOCLOPRAMIDE HCL 10 MG PO TABS Oral Take 10 mg by mouth 4 (four) times daily - after meals and at bedtime. Nausea    . METOPROLOL TARTRATE 25 MG PO TABS Oral Take 25 mg by mouth 2 (two) times daily.    Marland Kitchen PANTOPRAZOLE SODIUM 40 MG PO TBEC Oral Take 40 mg by mouth daily.    Marland Kitchen SIMVASTATIN 40 MG PO TABS Oral Take 40 mg by mouth every evening.      BP 193/72  Pulse 74  Temp 98.5 F (36.9 C) (Oral)  Resp 18  Ht 5\' 3"  (1.6 m)  Wt 247 lb (112.038 kg)  BMI 43.75 kg/m2  SpO2 100%  Physical Exam  Nursing note and vitals reviewed. Constitutional: She appears well-developed and well-nourished. No distress.  HENT:  Head: Normocephalic and atraumatic.  Right Ear: External ear normal.  Left Ear: External ear normal.  Eyes: Conjunctivae normal are normal.  Right eye exhibits no discharge. Left eye exhibits no discharge. No scleral icterus.  Neck: Neck supple. No tracheal deviation present.  Cardiovascular: Normal rate, regular rhythm and intact distal pulses.   Pulmonary/Chest: Effort normal and breath sounds normal. No stridor. No respiratory distress. She has no wheezes. She has no rales.  Abdominal: Soft. Bowel sounds are normal. She exhibits no distension. There is no tenderness. There is no rebound and no guarding.  Musculoskeletal: She exhibits tenderness (mild tenderness in her extremities). She exhibits no edema.  Neurological: She is alert. She has normal strength. No sensory deficit. Cranial nerve deficit:  no gross defecits noted. She exhibits normal muscle tone. She displays no seizure activity. Coordination normal.  Skin: Skin is warm and dry. No rash noted.  Psychiatric: She has a normal mood and affect.    ED Course  Procedures (including critical care time) EKG Rate 76 SINUS RHYTHM ~ normal P axis, V-rate 50- 99 INCOMPLETE RIGHT BUNDLE BRANCH BLOCK ~ QRSd >105, terminal axis(90,270) Nl rate and intervals, no acute st changes Labs Reviewed  BASIC METABOLIC PANEL - Abnormal; Notable for the following:    Glucose, Bld 215 (*)     All other components within normal limits  CBC - Abnormal; Notable for the following:    HCT 35.9 (*)     All other components within normal limits  POCT I-STAT TROPONIN I   No results found.   1. Palpitation       MDM  Pt without complaints in the ED.  No further palpitations.  She states she recently had an outpatient monitor tests.  She is not sure of the results.  At this time there does not appear to be any evidence of an acute emergency medical condition and the patient appears stable for discharge with appropriate outpatient follow up.         Celene Kras, MD 06/26/12 [phone]

## 2012-06-29 DIAGNOSIS — L608 Other nail disorders: Secondary | ICD-10-CM | POA: Diagnosis not present

## 2012-06-29 DIAGNOSIS — M79609 Pain in unspecified limb: Secondary | ICD-10-CM | POA: Diagnosis not present

## 2012-06-29 DIAGNOSIS — B351 Tinea unguium: Secondary | ICD-10-CM | POA: Diagnosis not present

## 2012-06-29 DIAGNOSIS — E109 Type 1 diabetes mellitus without complications: Secondary | ICD-10-CM | POA: Diagnosis not present

## 2012-07-06 ENCOUNTER — Encounter (HOSPITAL_COMMUNITY): Payer: Self-pay | Admitting: Emergency Medicine

## 2012-07-06 ENCOUNTER — Emergency Department (HOSPITAL_COMMUNITY): Payer: Medicare Other

## 2012-07-06 ENCOUNTER — Inpatient Hospital Stay (HOSPITAL_COMMUNITY)
Admission: EM | Admit: 2012-07-06 | Discharge: 2012-07-12 | DRG: 493 | Disposition: A | Discharge status: Skilled Nursing Facility | Payer: Medicare Other | Attending: Family Medicine | Admitting: Family Medicine

## 2012-07-06 DIAGNOSIS — Z96659 Presence of unspecified artificial knee joint: Secondary | ICD-10-CM

## 2012-07-06 DIAGNOSIS — Y9289 Other specified places as the place of occurrence of the external cause: Secondary | ICD-10-CM

## 2012-07-06 DIAGNOSIS — D62 Acute posthemorrhagic anemia: Secondary | ICD-10-CM | POA: Diagnosis not present

## 2012-07-06 DIAGNOSIS — Z9861 Coronary angioplasty status: Secondary | ICD-10-CM

## 2012-07-06 DIAGNOSIS — S82853A Displaced trimalleolar fracture of unspecified lower leg, initial encounter for closed fracture: Principal | ICD-10-CM | POA: Diagnosis present

## 2012-07-06 DIAGNOSIS — K449 Diaphragmatic hernia without obstruction or gangrene: Secondary | ICD-10-CM | POA: Diagnosis present

## 2012-07-06 DIAGNOSIS — K219 Gastro-esophageal reflux disease without esophagitis: Secondary | ICD-10-CM | POA: Diagnosis present

## 2012-07-06 DIAGNOSIS — I255 Ischemic cardiomyopathy: Secondary | ICD-10-CM | POA: Diagnosis present

## 2012-07-06 DIAGNOSIS — E669 Obesity, unspecified: Secondary | ICD-10-CM

## 2012-07-06 DIAGNOSIS — S82852A Displaced trimalleolar fracture of left lower leg, initial encounter for closed fracture: Secondary | ICD-10-CM

## 2012-07-06 DIAGNOSIS — E119 Type 2 diabetes mellitus without complications: Secondary | ICD-10-CM | POA: Diagnosis not present

## 2012-07-06 DIAGNOSIS — C3490 Malignant neoplasm of unspecified part of unspecified bronchus or lung: Secondary | ICD-10-CM

## 2012-07-06 DIAGNOSIS — Z6841 Body Mass Index (BMI) 40.0 and over, adult: Secondary | ICD-10-CM

## 2012-07-06 DIAGNOSIS — S8990XA Unspecified injury of unspecified lower leg, initial encounter: Secondary | ICD-10-CM | POA: Diagnosis not present

## 2012-07-06 DIAGNOSIS — I2510 Atherosclerotic heart disease of native coronary artery without angina pectoris: Secondary | ICD-10-CM | POA: Diagnosis not present

## 2012-07-06 DIAGNOSIS — M25579 Pain in unspecified ankle and joints of unspecified foot: Secondary | ICD-10-CM | POA: Diagnosis not present

## 2012-07-06 DIAGNOSIS — I10 Essential (primary) hypertension: Secondary | ICD-10-CM

## 2012-07-06 DIAGNOSIS — I480 Paroxysmal atrial fibrillation: Secondary | ICD-10-CM | POA: Diagnosis present

## 2012-07-06 DIAGNOSIS — Z7901 Long term (current) use of anticoagulants: Secondary | ICD-10-CM

## 2012-07-06 DIAGNOSIS — W19XXXA Unspecified fall, initial encounter: Secondary | ICD-10-CM | POA: Diagnosis present

## 2012-07-06 DIAGNOSIS — I252 Old myocardial infarction: Secondary | ICD-10-CM

## 2012-07-06 DIAGNOSIS — I4891 Unspecified atrial fibrillation: Secondary | ICD-10-CM | POA: Diagnosis not present

## 2012-07-06 DIAGNOSIS — S99929A Unspecified injury of unspecified foot, initial encounter: Secondary | ICD-10-CM | POA: Diagnosis not present

## 2012-07-06 DIAGNOSIS — R52 Pain, unspecified: Secondary | ICD-10-CM | POA: Diagnosis not present

## 2012-07-06 DIAGNOSIS — I2589 Other forms of chronic ischemic heart disease: Secondary | ICD-10-CM | POA: Diagnosis present

## 2012-07-06 DIAGNOSIS — M25569 Pain in unspecified knee: Secondary | ICD-10-CM | POA: Diagnosis present

## 2012-07-06 DIAGNOSIS — IMO0001 Reserved for inherently not codable concepts without codable children: Secondary | ICD-10-CM | POA: Diagnosis present

## 2012-07-06 HISTORY — DX: Paroxysmal atrial fibrillation: I48.0

## 2012-07-06 HISTORY — DX: Presence of coronary angioplasty implant and graft: Z95.5

## 2012-07-06 HISTORY — DX: Non-ST elevation (NSTEMI) myocardial infarction: I21.4

## 2012-07-06 MED ORDER — OXYCODONE-ACETAMINOPHEN 5-325 MG PO TABS
2.0000 | ORAL_TABLET | Freq: Once | ORAL | Status: AC
  Administered 2012-07-06: 2 via ORAL
  Filled 2012-07-06: qty 2

## 2012-07-06 NOTE — ED Notes (Signed)
Pt fell getting into car and injured left knee and ankle.  Ankle swollen but no deformity.  Pt heard her knee pop. She has had knee replacement surgery on the left knee.

## 2012-07-06 NOTE — ED Notes (Signed)
Benedict, Ortho Tech, notified that patient needed knee immobilizer applied.

## 2012-07-06 NOTE — ED Provider Notes (Signed)
History     CSN: [id number]  Arrival date & time 07/06/12  2157   First MD Initiated Contact with Patient 07/06/12 2213      Chief Complaint  Patient presents with  . Knee Pain  . Ankle Pain   HPI  History provided by the patient. Patient is a 61 year old female with history of hypertension, diabetes, CAD status post coronary stent, atrial fibrillation, and left knee replacement who presents with complaints of left knee and ankle pain. Patient states that she was getting rate had step into her car and had placed her right foot and so the car when her left knee and leg gave out. Patient reports hearing a pop and twisting her ankle. Patient came down onto her side. She denies any head injury or trauma. There was no LOC. Since that time she has had severe pain to her knee and ankle. Pain is worse with any pressure or movement. Patient was transported by EMS. They applied ice to that area but no other treatment was provided. Patient denies any other associated symptoms.    Past Medical History  Diagnosis Date  . Hypertension   . Hypercholesteremia   . Cancer   . Lung cancer   . Peripheral vascular disease   . Dysrhythmia 04/07/2012    atrial fib  . Sleep apnea     " mild form ,does not use cpap "  . Diabetes mellitus     insulin dependent  . GERD (gastroesophageal reflux disease)   . H/O hiatal hernia   . Arthritis   . MI (myocardial infarction)   . Stented coronary artery   . Atrial fibrillation     Past Surgical History  Procedure Date  . Abdominal hysterectomy   . Cholecystectomy   . Left knee replacement   . Lung surgery     Family History  Problem Relation Age of Onset  . Hypertension Mother   . Coronary artery disease Brother     History  Substance Use Topics  . Smoking status: Former Smoker -- 1.0 packs/day for 20 years    Types: Cigarettes    Quit date: 09/27/1984  . Smokeless tobacco: Never Used  . Alcohol Use: No    OB History    Grav Para Term  Preterm Abortions TAB SAB Ect Mult Living                  Review of Systems  HENT: Negative for neck pain.   Musculoskeletal:       Left knee and ankle pain  Neurological: Negative for syncope, weakness, numbness and headaches.  All other systems reviewed and are negative.    Allergies  Levofloxacin and Morphine and related  Home Medications   Current Outpatient Rx  Name Route Sig Dispense Refill  . ALBUTEROL SULFATE HFA 108 (90 BASE) MCG/ACT IN AERS Inhalation Inhale 1-2 puffs into the lungs every 6 (six) hours as needed for wheezing. 1 Inhaler 0  . VITAMIN D 1000 UNITS PO TABS Oral Take 2,000 Units by mouth at bedtime.    . CLOPIDOGREL BISULFATE 75 MG PO TABS Oral Take 75 mg by mouth every morning.     . INSULIN ISOPHANE HUMAN 100 UNIT/ML [place] SUSP Subcutaneous Inject 5-10 Units into the skin at bedtime. Sliding scale    . INSULIN REGULAR HUMAN 100 UNIT/ML IJ SOLN Subcutaneous Inject 5 Units into the skin 3 (three) times daily before meals.    Marland Kitchen LISINOPRIL-HYDROCHLOROTHIAZIDE 20-25 MG PO TABS Oral Take  1 tablet by mouth at bedtime.     Marland Kitchen METOCLOPRAMIDE HCL 10 MG PO TABS Oral Take 10 mg by mouth 4 (four) times daily as needed. Nausea    . METOPROLOL TARTRATE 25 MG PO TABS Oral Take 37.5 mg by mouth 2 (two) times daily.     Marland Kitchen NAPROXEN SODIUM 220 MG PO TABS Oral Take 440 mg by mouth 2 (two) times daily as needed. For pain    . PANTOPRAZOLE SODIUM 40 MG PO TBEC Oral Take 40 mg by mouth every morning.     Marland Kitchen SIMVASTATIN 40 MG PO TABS Oral Take 40 mg by mouth at bedtime.     . WARFARIN SODIUM 5 MG PO TABS Oral Take 5-7.5 mg by mouth daily. Takes 5 mg ( 1 tablet) on Sunday, Tuesday and Thursday and takes 7.5 mg (1.5 tablets) Monday, Wednesday, Friday and saturday      BP 168/56  Pulse 66  Temp 98.3 F (36.8 C) (Oral)  Resp 20  Wt 247 lb (112.038 kg)  SpO2 97%  Physical Exam  Nursing note and vitals reviewed. Constitutional: She is oriented to person, place, and time. She appears  well-developed and well-nourished. No distress.  HENT:  Head: Normocephalic.  Cardiovascular: Normal rate.   Pulmonary/Chest: Effort normal and breath sounds normal. No respiratory distress. She has no wheezes. She has no rales.  Musculoskeletal: She exhibits edema and tenderness.       Swelling and pain around left ankle. No appreciable gross deformity. Normal dorsal pedal pulses, movement and sensation in toes.  Midline surgical scar over left anterior knee consistent with history of prior surgery. There is diffuse tenderness. Exam is limited given body habitus and pain.  Neurological: She is alert and oriented to person, place, and time.  Skin: Skin is warm and dry. No rash noted.  Psychiatric: She has a normal mood and affect. Her behavior is normal.    ED Course  Procedures   Results for orders placed during the hospital encounter of 07/06/12  PROTIME-INR      Component Value Range   Prothrombin Time 19.3 (*) 11.6 - 15.2 seconds   INR 1.69 (*) 0.00 - 1.49  CBC WITH DIFFERENTIAL      Component Value Range   WBC 11.4 (*) 4.0 - 10.5 K/uL   RBC 4.25  3.87 - 5.11 MIL/uL   Hemoglobin 12.2  12.0 - 15.0 g/dL   HCT 16.1 (*) 09.6 - 04.5 %   MCV 81.9  78.0 - 100.0 fL   MCH 28.7  26.0 - 34.0 pg   MCHC 35.1  30.0 - 36.0 g/dL   RDW 40.9  81.1 - 91.4 %   Platelets 228  150 - 400 K/uL   Neutrophils Relative 80 (*) 43 - 77 %   Neutro Abs 9.1 (*) 1.7 - 7.7 K/uL   Lymphocytes Relative 13  12 - 46 %   Lymphs Abs 1.5  0.7 - 4.0 K/uL   Monocytes Relative 6  3 - 12 %   Monocytes Absolute 0.7  0.1 - 1.0 K/uL   Eosinophils Relative 1  0 - 5 %   Eosinophils Absolute 0.1  0.0 - 0.7 K/uL   Basophils Relative 0  0 - 1 %   Basophils Absolute 0.0  0.0 - 0.1 K/uL  BASIC METABOLIC PANEL      Component Value Range   Sodium 134 (*) 135 - 145 mEq/L   Potassium 3.6  3.5 - 5.1 mEq/L  Chloride 96  96 - 112 mEq/L   CO2 29  19 - 32 mEq/L   Glucose, Bld 161 (*) 70 - 99 mg/dL   BUN 12  6 - 23 mg/dL    Creatinine, Ser 1.61  0.50 - 1.10 mg/dL   Calcium 9.6  8.4 - 09.6 mg/dL   GFR calc non Af Amer >90  >90 mL/min   GFR calc Af Amer >90  >90 mL/min  URINALYSIS, ROUTINE W REFLEX MICROSCOPIC      Component Value Range   Color, Urine YELLOW  YELLOW   APPearance CLEAR  CLEAR   Specific Gravity, Urine 1.015  1.005 - 1.030   pH 7.0  5.0 - 8.0   Glucose, UA NEGATIVE  NEGATIVE mg/dL   Hgb urine dipstick NEGATIVE  NEGATIVE   Bilirubin Urine NEGATIVE  NEGATIVE   Ketones, ur NEGATIVE  NEGATIVE mg/dL   Protein, ur NEGATIVE  NEGATIVE mg/dL   Urobilinogen, UA 1.0  0.0 - 1.0 mg/dL   Nitrite NEGATIVE  NEGATIVE   Leukocytes, UA NEGATIVE  NEGATIVE  GLUCOSE, CAPILLARY      Component Value Range   Glucose-Capillary 142 (*) 70 - 99 mg/dL      Dg Ankle Complete Left  07/06/2012  *RADIOLOGY REPORT*  Clinical Data: Post fall, now with left ankle pain and swelling  LEFT ANKLE COMPLETE - 3+ VIEW  Comparison: None.  Findings: There is a minimally displaced trimalleolar fracture. The medial malleolar fracture appears to extend to the tibial talar articular surface.  The ankle mortise appears preserved.  The distal tibial fracture appears to extend to the distal tibiofibular joint.  There is expected adjacent soft tissue swelling and small ankle joint effusion.  Small plantar calcaneal spur.  Minimal enthesopathic change of Achilles tendon insertion site.  IMPRESSION: Minimally displaced trimalleolar fracture.   Original Report Authenticated By: Waynard Reeds, M.D.    Dg Knee Complete 4 Views Left  07/06/2012  *RADIOLOGY REPORT*  Clinical Data: Post fall, now with left-sided knee pain  LEFT KNEE - COMPLETE 4+ VIEW  Comparison: Left knee radiographs - 11/26/2011  Findings: Stable sequela of redo left total knee replacement. Minimal periprosthetic lucency about both the femoral and tibial shaft components is grossly unchanged.  Diffuse osteopenia without definite fracture.  No definite joint effusion.  Regional  soft tissues are normal.  No radiopaque foreign body.  IMPRESSION: 1. No acute findings.  2.  Stable sequela of redo left total knee replacement.  Unchanged periprosthetic lucency, nonspecific but may be indicative of loosening.   Original Report Authenticated By: Waynard Reeds, M.D.      1. Trimalleolar fracture of left ankle   2. Knee pain   3. ADENOCARCINOMA, LUNG, LEFT   4. Atrial fibrillation   5. CAD (coronary artery disease)   6. Type II or unspecified type diabetes mellitus without mention of complication, not stated as uncontrolled       MDM  10:50PM patient seen and evaluated. Patient in moderate discomfort and pain.   Spoke with Attending Physician.  Will consult Ortho.   Spoke with Dr. Cleophas Dunker  on call for Dr. August Saucer. He agrees with plan posterior and strip as well as knee immobilizer.  Recommends hospital admission for medical problems. He will pass information onto Dr. August Saucer to see patient in the morning   Spoke with [facility]. They will see patient and admit for observation with orthopedic consultation the a.m.     Angus Seller, PA  07/07/12 0422 

## 2012-07-06 NOTE — ED Notes (Signed)
ZOX:[id number]<AV> Expected date:<BR> Expected time:<BR> Means of arrival:Ambulance<BR> Comments:<BR> Fall/Ankle &amp; Knee Pain

## 2012-07-07 ENCOUNTER — Encounter (HOSPITAL_COMMUNITY): Payer: Self-pay | Admitting: Internal Medicine

## 2012-07-07 DIAGNOSIS — S82853A Displaced trimalleolar fracture of unspecified lower leg, initial encounter for closed fracture: Secondary | ICD-10-CM | POA: Diagnosis present

## 2012-07-07 DIAGNOSIS — Z01810 Encounter for preprocedural cardiovascular examination: Secondary | ICD-10-CM | POA: Diagnosis not present

## 2012-07-07 DIAGNOSIS — I4891 Unspecified atrial fibrillation: Secondary | ICD-10-CM | POA: Diagnosis not present

## 2012-07-07 DIAGNOSIS — I252 Old myocardial infarction: Secondary | ICD-10-CM | POA: Diagnosis not present

## 2012-07-07 DIAGNOSIS — Z95818 Presence of other cardiac implants and grafts: Secondary | ICD-10-CM | POA: Diagnosis not present

## 2012-07-07 DIAGNOSIS — I2589 Other forms of chronic ischemic heart disease: Secondary | ICD-10-CM

## 2012-07-07 DIAGNOSIS — I2510 Atherosclerotic heart disease of native coronary artery without angina pectoris: Secondary | ICD-10-CM | POA: Diagnosis not present

## 2012-07-07 LAB — CBC WITH DIFFERENTIAL/PLATELET
Basophils Absolute: 0 K/uL (ref 0.0–0.1)
Basophils Relative: 0 % (ref 0–1)
Basophils Relative: 0 % (ref 0–1)
Eosinophils Absolute: 0.1 10*3/uL (ref 0.0–0.7)
Eosinophils Absolute: 0.1 K/uL (ref 0.0–0.7)
Eosinophils Relative: 1 % (ref 0–5)
Eosinophils Relative: 1 % (ref 0–5)
HCT: 34.8 % — ABNORMAL LOW (ref 36.0–46.0)
HCT: 33 % — ABNORMAL LOW (ref 36.0–46.0)
Hemoglobin: 12.2 g/dL (ref 12.0–15.0)
Hemoglobin: 11.5 g/dL — ABNORMAL LOW (ref 12.0–15.0)
Lymphocytes Relative: 21 % (ref 12–46)
Lymphs Abs: 1.5 10*3/uL (ref 0.7–4.0)
Lymphs Abs: 1.9 K/uL (ref 0.7–4.0)
MCH: 28.7 pg (ref 26.0–34.0)
MCH: 28.6 pg (ref 26.0–34.0)
MCHC: 35.1 g/dL (ref 30.0–36.0)
MCHC: 34.8 g/dL (ref 30.0–36.0)
MCV: 81.9 fL (ref 78.0–100.0)
MCV: 82.1 fL (ref 78.0–100.0)
Monocytes Absolute: 0.7 10*3/uL (ref 0.1–1.0)
Monocytes Absolute: 0.6 K/uL (ref 0.1–1.0)
Monocytes Relative: 6 % (ref 3–12)
Monocytes Relative: 6 % (ref 3–12)
Neutro Abs: 6.4 K/uL (ref 1.7–7.7)
Neutrophils Relative %: 71 % (ref 43–77)
Platelets: 222 K/uL (ref 150–400)
RBC: 4.25 MIL/uL (ref 3.87–5.11)
RBC: 4.02 MIL/uL (ref 3.87–5.11)
RDW: 12.9 % (ref 11.5–15.5)
WBC: 9.1 K/uL (ref 4.0–10.5)

## 2012-07-07 LAB — BASIC METABOLIC PANEL
BUN: 12 mg/dL (ref 6–23)
BUN: 11 mg/dL (ref 6–23)
CO2: 30 mEq/L (ref 19–32)
Calcium: 9.6 mg/dL (ref 8.4–10.5)
Chloride: 98 mEq/L (ref 96–112)
Creatinine, Ser: 0.67 mg/dL (ref 0.50–1.10)
GFR calc Af Amer: >90 mL/min (ref >90)
GFR calc non Af Amer: >90 mL/min (ref >90)
Glucose, Bld: 161 mg/dL — ABNORMAL HIGH (ref 70–99)
Glucose, Bld: 182 mg/dL — ABNORMAL HIGH (ref 70–99)
Potassium: 3.6 mEq/L (ref 3.5–5.1)
Potassium: 3.4 mEq/L — ABNORMAL LOW (ref 3.5–5.1)

## 2012-07-07 LAB — URINALYSIS, ROUTINE W REFLEX MICROSCOPIC
Bilirubin Urine: NEGATIVE
Hgb urine dipstick: NEGATIVE
Ketones, ur: NEGATIVE mg/dL
Specific Gravity, Urine: 1.015 (ref 1.005–1.030)
Urobilinogen, UA: 1 mg/dL (ref 0.0–1.0)

## 2012-07-07 LAB — PROTIME-INR
INR: 1.78 — ABNORMAL HIGH (ref 0.00–1.49)
Prothrombin Time: 20.1 seconds — ABNORMAL HIGH (ref 11.6–15.2)

## 2012-07-07 LAB — GLUCOSE, CAPILLARY
Glucose-Capillary: 209 mg/dL — ABNORMAL HIGH (ref 70–99)
Glucose-Capillary: 214 mg/dL — ABNORMAL HIGH (ref 70–99)

## 2012-07-07 MED ORDER — SODIUM CHLORIDE 0.9 % IJ SOLN
3.0000 mL | Freq: Two times a day (BID) | INTRAMUSCULAR | Status: DC
  Administered 2012-07-07 – 2012-07-11 (×3): 3 mL via INTRAVENOUS

## 2012-07-07 MED ORDER — METOCLOPRAMIDE HCL 10 MG PO TABS: 10.0000 mg | ORAL_TABLET | Freq: Four times a day (QID) | ORAL | Status: DC | PRN

## 2012-07-07 MED ORDER — ACETAMINOPHEN 650 MG RE SUPP: 650.0000 mg | Freq: Four times a day (QID) | RECTAL | Status: DC | PRN

## 2012-07-07 MED ORDER — ONDANSETRON HCL 4 MG/2ML IJ SOLN: 4.0000 mg | Freq: Four times a day (QID) | INTRAMUSCULAR | Status: DC | PRN

## 2012-07-07 MED ORDER — METOPROLOL TARTRATE 25 MG PO TABS
37.5000 mg | ORAL_TABLET | Freq: Two times a day (BID) | ORAL | Status: DC
  Administered 2012-07-07: 37.5 mg via ORAL
  Filled 2012-07-07 (×3): qty 1

## 2012-07-07 MED ORDER — HYDROCHLOROTHIAZIDE 25 MG PO TABS
25.0000 mg | ORAL_TABLET | Freq: Every day | ORAL | Status: DC
  Administered 2012-07-07 – 2012-07-11 (×4): 25 mg via ORAL
  Filled 2012-07-07 (×7): qty 1

## 2012-07-07 MED ORDER — SODIUM CHLORIDE 0.9 % IJ SOLN
3.0000 mL | Freq: Two times a day (BID) | INTRAMUSCULAR | Status: DC
  Administered 2012-07-07 – 2012-07-11 (×4): 3 mL via INTRAVENOUS

## 2012-07-07 MED ORDER — ACETAMINOPHEN 325 MG PO TABS
650.0000 mg | ORAL_TABLET | Freq: Four times a day (QID) | ORAL | Status: DC | PRN
  Administered 2012-07-07: 650 mg via ORAL
  Filled 2012-07-07: qty 2

## 2012-07-07 MED ORDER — SODIUM CHLORIDE 0.9 % IV SOLN
250.0000 mL | INTRAVENOUS | Status: DC | PRN
  Administered 2012-07-07: 250 mL via INTRAVENOUS

## 2012-07-07 MED ORDER — LISINOPRIL-HYDROCHLOROTHIAZIDE 20-25 MG PO TABS: 1.0000 | ORAL_TABLET | Freq: Every day | ORAL | Status: DC

## 2012-07-07 MED ORDER — INSULIN NPH (HUMAN) (ISOPHANE) 100 UNIT/ML ~~LOC~~ SUSP
5.0000 [IU] | Freq: Every day | SUBCUTANEOUS | Status: DC
  Administered 2012-07-07 – 2012-07-08 (×2): 10 [IU] via SUBCUTANEOUS
  Administered 2012-07-09 – 2012-07-11 (×3): 5 [IU] via SUBCUTANEOUS
  Filled 2012-07-07: qty 10

## 2012-07-07 MED ORDER — METOPROLOL TARTRATE 50 MG PO TABS
50.0000 mg | ORAL_TABLET | Freq: Two times a day (BID) | ORAL | Status: DC
  Administered 2012-07-07 – 2012-07-12 (×9): 50 mg via ORAL
  Filled 2012-07-07 (×12): qty 1

## 2012-07-07 MED ORDER — SIMVASTATIN 40 MG PO TABS
40.0000 mg | ORAL_TABLET | Freq: Every day | ORAL | Status: DC
Start: 2012-07-07 — End: 2012-07-12
  Administered 2012-07-07 – 2012-07-11 (×4): 40 mg via ORAL
  Filled 2012-07-07 (×6): qty 1

## 2012-07-07 MED ORDER — WARFARIN SODIUM 4 MG PO TABS
8.0000 mg | ORAL_TABLET | Freq: Once | ORAL | Status: DC
  Filled 2012-07-07: qty 2

## 2012-07-07 MED ORDER — INSULIN ASPART 100 UNIT/ML ~~LOC~~ SOLN
0.0000 [IU] | Freq: Three times a day (TID) | SUBCUTANEOUS | Status: DC
  Administered 2012-07-07 (×3): 3 [IU] via SUBCUTANEOUS
  Administered 2012-07-08 (×2): 2 [IU] via SUBCUTANEOUS
  Administered 2012-07-08 – 2012-07-09 (×4): 1 [IU] via SUBCUTANEOUS

## 2012-07-07 MED ORDER — ALBUTEROL SULFATE HFA 108 (90 BASE) MCG/ACT IN AERS: 1.0000 | INHALATION_SPRAY | Freq: Four times a day (QID) | RESPIRATORY_TRACT | Status: DC | PRN

## 2012-07-07 MED ORDER — LISINOPRIL 20 MG PO TABS
20.0000 mg | ORAL_TABLET | Freq: Every day | ORAL | Status: DC
  Administered 2012-07-07 – 2012-07-11 (×4): 20 mg via ORAL
  Filled 2012-07-07 (×6): qty 1

## 2012-07-07 MED ORDER — VITAMIN D3 25 MCG (1000 UNIT) PO TABS
2000.0000 [IU] | ORAL_TABLET | Freq: Every day | ORAL | Status: DC
  Administered 2012-07-07 – 2012-07-11 (×4): 2000 [IU] via ORAL
  Filled 2012-07-07 (×6): qty 2

## 2012-07-07 MED ORDER — SODIUM CHLORIDE 0.9 % IJ SOLN: 3.0000 mL | INTRAMUSCULAR | Status: DC | PRN

## 2012-07-07 MED ORDER — HYDROMORPHONE HCL PF 1 MG/ML IJ SOLN
1.0000 mg | INTRAMUSCULAR | Status: DC | PRN
  Administered 2012-07-07 – 2012-07-09 (×11): 1 mg via INTRAVENOUS
  Filled 2012-07-07 (×11): qty 1

## 2012-07-07 MED ORDER — PANTOPRAZOLE SODIUM 40 MG PO TBEC
40.0000 mg | DELAYED_RELEASE_TABLET | Freq: Every morning | ORAL | Status: DC
  Administered 2012-07-07 – 2012-07-12 (×6): 40 mg via ORAL
  Filled 2012-07-07 (×6): qty 1

## 2012-07-07 MED ORDER — HEPARIN (PORCINE) IN NACL 100-0.45 UNIT/ML-% IJ SOLN
1100.0000 [IU]/h | INTRAMUSCULAR | Status: DC
  Administered 2012-07-07: 1100 [IU]/h via INTRAVENOUS
  Filled 2012-07-07: qty 250

## 2012-07-07 MED ORDER — ONDANSETRON HCL 4 MG PO TABS: 4.0000 mg | ORAL_TABLET | Freq: Four times a day (QID) | ORAL | Status: DC | PRN

## 2012-07-07 MED ORDER — CLOPIDOGREL BISULFATE 75 MG PO TABS
75.0000 mg | ORAL_TABLET | Freq: Every day | ORAL | Status: DC
  Administered 2012-07-07 – 2012-07-12 (×6): 75 mg via ORAL
  Filled 2012-07-07 (×7): qty 1

## 2012-07-07 MED ORDER — WARFARIN - PHARMACIST DOSING INPATIENT: Freq: Every day | Status: DC

## 2012-07-07 MED ORDER — INFLUENZA VIRUS VACC SPLIT PF IM SUSP
0.5000 mL | INTRAMUSCULAR | Status: AC
  Administered 2012-07-08: 0.5 mL via INTRAMUSCULAR
  Filled 2012-07-07: qty 0.5

## 2012-07-07 NOTE — Progress Notes (Signed)
Called by RN, apparently surgery being considered for this weekend. Will stop Coumadin and start Heparin when INR less than 2. Heparin can be stopped 6 hours pre op and resumed when the surgeon feel sits safe. Continue Plavix.   Corine Shelter PA-C 07/07/2012 5:56 PM

## 2012-07-07 NOTE — ED Provider Notes (Signed)
Medical screening examination/treatment/procedure(s) were performed by non-physician practitioner and as supervising physician I was immediately available for consultation/collaboration.    Nelia Shi, MD 07/07/12 [phone]

## 2012-07-07 NOTE — Progress Notes (Signed)
ANTICOAGULATION CONSULT NOTE - Initial Consult  Pharmacy Consult for Warfarin Indication: atrial fibrillation  Allergies  Allergen Reactions  . Levofloxacin Itching  . Morphine And Related Itching    Patient Measurements: Weight: 247 lb (112.038 kg)   Vital Signs: Temp: 98.3 F (36.8 C) (10/10 2211) Temp src: Oral (10/10 2211) BP: 134/54 mmHg (10/11 0255) Pulse Rate: 66  (10/11 0255)  Labs:  Basename 07/06/12 2350  HGB 12.2  HCT 34.8*  PLT 228  APTT --  LABPROT 19.3*  INR 1.69*  HEPARINUNFRC --  CREATININE 0.67  CKTOTAL --  CKMB --  TROPONINI --    The CrCl is unknown because both a height and weight (above a minimum accepted value) are required for this calculation.   Medical History: Past Medical History  Diagnosis Date  . Hypertension   . Hypercholesteremia   . Cancer   . Lung cancer   . Peripheral vascular disease   . Dysrhythmia 04/07/2012    atrial fib  . Sleep apnea     " mild form ,does not use cpap "  . Diabetes mellitus     insulin dependent  . GERD (gastroesophageal reflux disease)   . H/O hiatal hernia   . Arthritis   . MI (myocardial infarction)   . Stented coronary artery   . Atrial fibrillation     Medications:  Scheduled:    . cholecalciferol  2,000 Units Oral QHS  . clopidogrel  75 mg Oral QAC breakfast  . lisinopril  20 mg Oral QHS   And  . hydrochlorothiazide  25 mg Oral QHS  . insulin aspart  0-9 Units Subcutaneous TID WC  . insulin NPH  5-10 Units Subcutaneous QHS  . metoprolol tartrate  37.5 mg Oral BID  . oxyCODONE-acetaminophen  2 tablet Oral Once  . pantoprazole  40 mg Oral q morning - 10a  . simvastatin  40 mg Oral QHS  . sodium chloride  3 mL Intravenous Q12H  . DISCONTD: lisinopril-hydrochlorothiazide  1 tablet Oral QHS   Infusions:    Assessment: 61 yo female c/o left knee and ankle injury.  S/p knee replacement surgery in past.  Pt takes 5mg  Sun/Tue/Thur and 7.5mg  on M/W/Fri/Sat- last dose 10/10 @  1800. Goal of Therapy:  INR 2-3    Plan:   Daily PT/INR  Education  Susanne Greenhouse R 07/07/2012,3:39 AM

## 2012-07-07 NOTE — Progress Notes (Signed)
[facility]             Progress Note   Subjective: Says her knee and ankle are bothering her.  Objective: Vital signs in last 24 hours: Temp:  [98.3 F (36.8 C)-98.9 F (37.2 C)] 98.6 F (37 C) (10/11 1400) Pulse Rate:  [64-71] 68  (10/11 1400) Resp:  [18-20] 18  (10/11 1400) BP: (134-168)/(54-77) 139/77 mmHg (10/11 1400) SpO2:  [93 %-98 %] 93 % (10/11 1400) Weight:  [112.038 kg (247 lb)] 112.038 kg (247 lb) (10/11 0345) Weight change:  Last BM Date: 07/07/12  Intake/Output from previous day: 10/10 0701 - 10/11 0700 In: 10 [I.V.:10] Out: 300 [Urine:300] Total I/O In: 280.2 [P.O.:240; I.V.:40.2] Out: 350 [Urine:350]   Physical Exam: General: Alert, awake, oriented x3, in no acute distress. HEENT: No bruits, no goiter. Heart: Regular rate and rhythm, without murmurs, rubs, gallops. Lungs: Clear to auscultation bilaterally. Abdomen: Soft, nontender, nondistended, positive bowel sounds. Extremities: No clubbing cyanosis or edema with positive pedal pulses. Neuro: Grossly intact, nonfocal.    Lab Results: Basic Metabolic Panel:  Basename 07/07/12 0432 07/06/12 2350  NA 136 134*  K 3.4* 3.6  CL 98 96  CO2 30 29  GLUCOSE 182* 161*  BUN 11 12  CREATININE 0.74 0.67  CALCIUM 9.1 9.6  MG -- --  PHOS -- --   CBC:  Basename 07/07/12 0432 07/06/12 2350  WBC 9.1 11.4*  NEUTROABS 6.4 9.1*  HGB 11.5* 12.2  HCT 33.0* 34.8*  MCV 82.1 81.9  PLT 222 228   CBG:  Basename 07/07/12 1712 07/07/12 1155 07/07/12 0718 07/07/12 0335  GLUCAP 241* 214* 209* 142*   Coagulation:  Basename 07/07/12 0432 07/06/12 2350  LABPROT 20.1* 19.3*  INR 1.78* 1.69*   Urinalysis:  Basename 07/07/12 0159  COLORURINE YELLOW  LABSPEC 1.015  PHURINE 7.0  GLUCOSEU NEGATIVE  HGBUR NEGATIVE  BILIRUBINUR NEGATIVE  KETONESUR NEGATIVE  PROTEINUR NEGATIVE  UROBILINOGEN 1.0  NITRITE NEGATIVE  LEUKOCYTESUR NEGATIVE    Studies/Results: Dg Ankle Complete  Left  07/06/2012  *RADIOLOGY REPORT*  Clinical Data: Post fall, now with left ankle pain and swelling  LEFT ANKLE COMPLETE - 3+ VIEW  Comparison: None.  Findings: There is a minimally displaced trimalleolar fracture. The medial malleolar fracture appears to extend to the tibial talar articular surface.  The ankle mortise appears preserved.  The distal tibial fracture appears to extend to the distal tibiofibular joint.  There is expected adjacent soft tissue swelling and small ankle joint effusion.  Small plantar calcaneal spur.  Minimal enthesopathic change of Achilles tendon insertion site.  IMPRESSION: Minimally displaced trimalleolar fracture.   Original Report Authenticated By: Waynard Reeds, M.D.    Dg Knee Complete 4 Views Left  07/06/2012  *RADIOLOGY REPORT*  Clinical Data: Post fall, now with left-sided knee pain  LEFT KNEE - COMPLETE 4+ VIEW  Comparison: Left knee radiographs - 11/26/2011  Findings: Stable sequela of redo left total knee replacement. Minimal periprosthetic lucency about both the femoral and tibial shaft components is grossly unchanged.  Diffuse osteopenia without definite fracture.  No definite joint effusion.  Regional soft tissues are normal.  No radiopaque foreign body.  IMPRESSION: 1. No acute findings.  2.  Stable sequela of redo left total knee replacement.  Unchanged periprosthetic lucency, nonspecific but may be indicative of loosening.   Original Report Authenticated By: Waynard Reeds, M.D.     Medications: Scheduled Meds:   . cholecalciferol  2,000 Units Oral QHS  .  clopidogrel  75 mg Oral QAC breakfast  . lisinopril  20 mg Oral QHS   And  . hydrochlorothiazide  25 mg Oral QHS  . influenza  inactive virus vaccine  0.5 mL Intramuscular Tomorrow-1000  . insulin aspart  0-9 Units Subcutaneous TID WC  . insulin NPH  5-10 Units Subcutaneous QHS  . metoprolol tartrate  50 mg Oral BID  . oxyCODONE-acetaminophen  2 tablet Oral Once  . pantoprazole  40 mg Oral q  morning - 10a  . simvastatin  40 mg Oral QHS  . sodium chloride  3 mL Intravenous Q12H  . sodium chloride  3 mL Intravenous Q12H  . DISCONTD: lisinopril-hydrochlorothiazide  1 tablet Oral QHS  . DISCONTD: metoprolol tartrate  37.5 mg Oral BID  . DISCONTD: warfarin  8 mg Oral ONCE-1800  . DISCONTD: Warfarin - Pharmacist Dosing Inpatient   Does not apply q1800   Continuous Infusions:   . heparin     PRN Meds:.sodium chloride, acetaminophen, acetaminophen, albuterol, HYDROmorphone (DILAUDID) injection, metoCLOPramide, ondansetron (ZOFRAN) IV, ondansetron, sodium chloride  Assessment/Plan:  Principal Problem:  *Trimalleolar fracture Active Problems:  DIABETES, TYPE 2  PAF,   CAD (coronary artery disease), LAD DES 04/07/12  Ischemic cardiomyopathy, EF 40-45% cath 04/07/12   Ankle Fracture -Plan for OR as per ortho  CAD/AFib -Stable. -Cardiology involved and adjusting anticoagulation.  Time spent coordinating care: 10 minutes   LOS: 1 day   [facility] Pager: [phone] 07/07/2012, 6:59 PM

## 2012-07-07 NOTE — Consult Note (Signed)
Reason for Consult: Left ankle pain Referring Physician: Dr. Nicoletta Ba is an 61 y.o. female.  HPI: [name] is a 61 year old ambulatory female who injured her left ankle yesterday getting out of the car. She felt a twist and pop and reported left ankle pain and knee pain. She was brought to the emergency room where trimalleolar ankle fracture nondisplaced was diagnosed on radiographs the patient was splinted. Notably she had a heart attack in July and had a stent placed at that time`. She is on Plavix and Coumadin for DVT prophylaxis and because of this stent. She denies any other orthopedic complaints other than left knee pain. She denies any groin pain. Denies any numbness and tingling in the left foot.  Past Medical History  Diagnosis Date  . Hypertension   . Hypercholesteremia   . Cancer   . Lung cancer   . Peripheral vascular disease   . Dysrhythmia 04/07/2012    atrial fib  . Sleep apnea     " mild form ,does not use cpap "  . Diabetes mellitus     insulin dependent  . GERD (gastroesophageal reflux disease)   . H/O hiatal hernia   . Arthritis   . MI (myocardial infarction)   . Stented coronary artery   . Atrial fibrillation     Past Surgical History  Procedure Date  . Abdominal hysterectomy   . Cholecystectomy   . Left knee replacement   . Lung surgery     Family History  Problem Relation Age of Onset  . Hypertension Mother   . Coronary artery disease Brother     Social History:  reports that she quit smoking about 27 years ago. Her smoking use included Cigarettes. She has a 20 pack-year smoking history. She has never used smokeless tobacco. She reports that she does not drink alcohol or use illicit drugs.  Allergies:  Allergies  Allergen Reactions  . Levofloxacin Itching  . Morphine And Related Itching    Medications: I have reviewed the patient's current medications.  Results for orders placed during the hospital encounter of 07/06/12 (from the  past 48 hour(s))  PROTIME-INR     Status: Abnormal   Collection Time   07/06/12 11:50 PM      Component Value Range Comment   Prothrombin Time 19.3 (*) 11.6 - 15.2 seconds    INR 1.69 (*) 0.00 - 1.49   CBC WITH DIFFERENTIAL     Status: Abnormal   Collection Time   07/06/12 11:50 PM      Component Value Range Comment   WBC 11.4 (*) 4.0 - 10.5 K/uL    RBC 4.25  3.87 - 5.11 MIL/uL    Hemoglobin 12.2  12.0 - 15.0 g/dL    HCT 19.1 (*) 47.8 - 46.0 %    MCV 81.9  78.0 - 100.0 fL    MCH 28.7  26.0 - 34.0 pg    MCHC 35.1  30.0 - 36.0 g/dL    RDW 29.5  62.1 - 30.8 %    Platelets 228  150 - 400 K/uL    Neutrophils Relative 80 (*) 43 - 77 %    Neutro Abs 9.1 (*) 1.7 - 7.7 K/uL    Lymphocytes Relative 13  12 - 46 %    Lymphs Abs 1.5  0.7 - 4.0 K/uL    Monocytes Relative 6  3 - 12 %    Monocytes Absolute 0.7  0.1 - 1.0 K/uL  Eosinophils Relative 1  0 - 5 %    Eosinophils Absolute 0.1  0.0 - 0.7 K/uL    Basophils Relative 0  0 - 1 %    Basophils Absolute 0.0  0.0 - 0.1 K/uL   BASIC METABOLIC PANEL     Status: Abnormal   Collection Time   07/06/12 11:50 PM      Component Value Range Comment   Sodium 134 (*) 135 - 145 mEq/L    Potassium 3.6  3.5 - 5.1 mEq/L    Chloride 96  96 - 112 mEq/L    CO2 29  19 - 32 mEq/L    Glucose, Bld 161 (*) 70 - 99 mg/dL    BUN 12  6 - 23 mg/dL    Creatinine, Ser 1.47  0.50 - 1.10 mg/dL    Calcium 9.6  8.4 - 82.9 mg/dL    GFR calc non Af Amer >90  >90 mL/min    GFR calc Af Amer >90  >90 mL/min   URINALYSIS, ROUTINE W REFLEX MICROSCOPIC     Status: Normal   Collection Time   07/07/12  1:59 AM      Component Value Range Comment   Color, Urine YELLOW  YELLOW    APPearance CLEAR  CLEAR    Specific Gravity, Urine 1.015  1.005 - 1.030    pH 7.0  5.0 - 8.0    Glucose, UA NEGATIVE  NEGATIVE mg/dL    Hgb urine dipstick NEGATIVE  NEGATIVE    Bilirubin Urine NEGATIVE  NEGATIVE    Ketones, ur NEGATIVE  NEGATIVE mg/dL    Protein, ur NEGATIVE  NEGATIVE mg/dL      Urobilinogen, UA 1.0  0.0 - 1.0 mg/dL    Nitrite NEGATIVE  NEGATIVE    Leukocytes, UA NEGATIVE  NEGATIVE MICROSCOPIC NOT DONE ON URINES WITH NEGATIVE PROTEIN, BLOOD, LEUKOCYTES, NITRITE, OR GLUCOSE <1000 mg/dL.  GLUCOSE, CAPILLARY     Status: Abnormal   Collection Time   07/07/12  3:35 AM      Component Value Range Comment   Glucose-Capillary 142 (*) 70 - 99 mg/dL   BASIC METABOLIC PANEL     Status: Abnormal   Collection Time   07/07/12  4:32 AM      Component Value Range Comment   Sodium 136  135 - 145 mEq/L    Potassium 3.4 (*) 3.5 - 5.1 mEq/L    Chloride 98  96 - 112 mEq/L    CO2 30  19 - 32 mEq/L    Glucose, Bld 182 (*) 70 - 99 mg/dL    BUN 11  6 - 23 mg/dL    Creatinine, Ser 5.62  0.50 - 1.10 mg/dL    Calcium 9.1  8.4 - 13.0 mg/dL    GFR calc non Af Amer 89 (*) >90 mL/min    GFR calc Af Amer >90  >90 mL/min   CBC WITH DIFFERENTIAL     Status: Abnormal   Collection Time   07/07/12  4:32 AM      Component Value Range Comment   WBC 9.1  4.0 - 10.5 K/uL    RBC 4.02  3.87 - 5.11 MIL/uL    Hemoglobin 11.5 (*) 12.0 - 15.0 g/dL    HCT 86.5 (*) 78.4 - 46.0 %    MCV 82.1  78.0 - 100.0 fL    MCH 28.6  26.0 - 34.0 pg    MCHC 34.8  30.0 - 36.0 g/dL  RDW 12.9  11.5 - 15.5 %    Platelets 222  150 - 400 K/uL    Neutrophils Relative 71  43 - 77 %    Neutro Abs 6.4  1.7 - 7.7 K/uL    Lymphocytes Relative 21  12 - 46 %    Lymphs Abs 1.9  0.7 - 4.0 K/uL    Monocytes Relative 6  3 - 12 %    Monocytes Absolute 0.6  0.1 - 1.0 K/uL    Eosinophils Relative 1  0 - 5 %    Eosinophils Absolute 0.1  0.0 - 0.7 K/uL    Basophils Relative 0  0 - 1 %    Basophils Absolute 0.0  0.0 - 0.1 K/uL   PROTIME-INR     Status: Abnormal   Collection Time   07/07/12  4:32 AM      Component Value Range Comment   Prothrombin Time 20.1 (*) 11.6 - 15.2 seconds    INR 1.78 (*) 0.00 - 1.49   GLUCOSE, CAPILLARY     Status: Abnormal   Collection Time   07/07/12  7:18 AM      Component Value Range Comment    Glucose-Capillary 209 (*) 70 - 99 mg/dL     Dg Ankle Complete Left  07/06/2012  *RADIOLOGY REPORT*  Clinical Data: Post fall, now with left ankle pain and swelling  LEFT ANKLE COMPLETE - 3+ VIEW  Comparison: None.  Findings: There is a minimally displaced trimalleolar fracture. The medial malleolar fracture appears to extend to the tibial talar articular surface.  The ankle mortise appears preserved.  The distal tibial fracture appears to extend to the distal tibiofibular joint.  There is expected adjacent soft tissue swelling and small ankle joint effusion.  Small plantar calcaneal spur.  Minimal enthesopathic change of Achilles tendon insertion site.  IMPRESSION: Minimally displaced trimalleolar fracture.   Original Report Authenticated By: Waynard Reeds, M.D.    Dg Knee Complete 4 Views Left  07/06/2012  *RADIOLOGY REPORT*  Clinical Data: Post fall, now with left-sided knee pain  LEFT KNEE - COMPLETE 4+ VIEW  Comparison: Left knee radiographs - 11/26/2011  Findings: Stable sequela of redo left total knee replacement. Minimal periprosthetic lucency about both the femoral and tibial shaft components is grossly unchanged.  Diffuse osteopenia without definite fracture.  No definite joint effusion.  Regional soft tissues are normal.  No radiopaque foreign body.  IMPRESSION: 1. No acute findings.  2.  Stable sequela of redo left total knee replacement.  Unchanged periprosthetic lucency, nonspecific but may be indicative of loosening.   Original Report Authenticated By: Waynard Reeds, M.D.     Review of Systems  Constitutional: Negative.   HENT: Negative.   Eyes: Negative.   Respiratory: Negative.   Cardiovascular: Negative.   Gastrointestinal: Negative.   Genitourinary: Negative.   Musculoskeletal: Positive for joint pain.  Skin: Negative.   Neurological: Negative.   Endo/Heme/Allergies: Negative.   Psychiatric/Behavioral: Negative.    Blood pressure 144/72, pulse 71, temperature 98.9  F (37.2 C), temperature source Oral, resp. rate 20, height 5\' 3"  (1.6 m), weight 112.038 kg (247 lb), SpO2 98.00%. Physical Exam  Constitutional: She appears well-developed.  HENT:  Head: Normocephalic.  Eyes: Pupils are equal, round, and reactive to light.  Neck: Normal range of motion.  Cardiovascular: Normal rate.   Respiratory: Effort normal.   left foot splinted toes perfused and sensate and mobile. Collaterals are stable around the knee. Compartments soft.  The pain with passive range of motion of toes on the left  Assessment/Plan: Impression is minimally displaced trimalleolar ankle fracture on the left the patient who 3 months ago had a heart attack and had a stent placed. She is on Plavix and Coumadin. The fracture itself is minimally displaced and the mortise is maintained. This is present on both the AP and lateral views. She is at high risk for surgical intervention. My inclination at this time is to keep the ankle splinted in its current position and if it stays in this position I think it would heal is to have a functional ankle. If the ankle becomes unstable and the tails begins to shift medially or laterally the fixation will have to be considered. And atypical circumstance without recent heart attack or anticoagulation fixation is typically performed. However, in this particular circumstance where she just recently had a heart attack and because this fracture is minimally displaced nonoperative therapy becomes an option. Plan to allow the patient to eat today consult cardiology for possible surgical intervention recheck radiographs tomorrow n.p.o. after midnight tomorrow.  DEAN,GREGORY SCOTT 07/07/2012, 11:27 AM

## 2012-07-07 NOTE — Progress Notes (Addendum)
ANTICOAGULATION CONSULT NOTE - Follow Up Consult  Pharmacy Consult for Warfarin  Indication: A.Fib  Allergies  Allergen Reactions  . Levofloxacin Itching  . Morphine And Related Itching    Patient Measurements: Height: 5\' 3"  (160 cm) Weight: 247 lb (112.038 kg) IBW/kg (Calculated) : 52.4   Vital Signs: Temp: 98.9 F (37.2 C) (10/11 0351) Temp src: Oral (10/11 0351) BP: 144/72 mmHg (10/11 0923) Pulse Rate: 71  (10/11 0923)  Labs:  Basename 07/07/12 0432 07/06/12 2350  HGB 11.5* 12.2  HCT 33.0* 34.8*  PLT 222 228  APTT -- --  LABPROT 20.1* 19.3*  INR 1.78* 1.69*  HEPARINUNFRC -- --  CREATININE 0.74 0.67  CKTOTAL -- --  CKMB -- --  TROPONINI -- --    Estimated Creatinine Clearance: 87.7 ml/min (by C-G formula based on Cr of 0.74).   Medications:  Scheduled:    . cholecalciferol  2,000 Units Oral QHS  . clopidogrel  75 mg Oral QAC breakfast  . lisinopril  20 mg Oral QHS   And  . hydrochlorothiazide  25 mg Oral QHS  . influenza  inactive virus vaccine  0.5 mL Intramuscular Tomorrow-1000  . insulin aspart  0-9 Units Subcutaneous TID WC  . insulin NPH  5-10 Units Subcutaneous QHS  . metoprolol tartrate  37.5 mg Oral BID  . oxyCODONE-acetaminophen  2 tablet Oral Once  . pantoprazole  40 mg Oral q morning - 10a  . simvastatin  40 mg Oral QHS  . sodium chloride  3 mL Intravenous Q12H  . sodium chloride  3 mL Intravenous Q12H  . DISCONTD: lisinopril-hydrochlorothiazide  1 tablet Oral QHS   Infusions:   PRN: sodium chloride, acetaminophen, acetaminophen, albuterol, HYDROmorphone (DILAUDID) injection, metoCLOPramide, ondansetron (ZOFRAN) IV, ondansetron, sodium chloride  Assessment:  61 yo F on warfarin for hx A.Fib admitted with ankle fracture, continuing on admission.  Per note, ortho recommended conservative management with no surgery  PTA dose = 5 mg on sun/tues/thurs, and 7.5 mg on all other days.    INR on admission low at 1.69, increasing today.  Last  reported dose PTA was 10/10  Will give slightly higher dose tonight given low INR  Goal of Therapy:  INR 2-3 Monitor platelets by anticoagulation protocol: Yes   Plan:  1.) Coumadin 8 mg po x 1 tonight at 1800 2.) Follow Daily PT/INR 3.) Watch CBC    Jerrye Seebeck, Loma Messing PharmD Pager #: [phone] 9:29 AM 07/07/2012

## 2012-07-07 NOTE — H&P (Addendum)
[name] is an 61 y.o. female.   Patient was seen and examined on July 07, 2012. PCP - Dr. Georgianne Fick. Cardiologist - [facility]. Chief Complaint: Fall with left ankle and knee pain. HPI: 61 year-old female with history of CAD status post stenting in July 2013 after having non-ST elevation MI, ischemic cardiomyopathy EF 40-40%, diabetes mellitus2, hyperlipidemia, lung cancer status post lobectomy had a fall yesterday while trying to get in the car. Patient hurt her left knee and ankle and was brought to the ER. Patient denies having lost consciousness or hit her head. In the ER x-rays revealed left trimalleolar fracture. Dr. August Saucer on-call orthopedic surgeon was consulted by the ER physician and at this time was placed on knee immobilizer and has been admitted as patient has difficulty walking and will need further assessment with help of physical therapy. Patient otherwise denies any chest pain shortness of breath or nausea vomiting dizziness focal deficits or palpitations.  Past Medical History  Diagnosis Date  . Hypertension   . Hypercholesteremia   . Cancer   . Lung cancer   . Peripheral vascular disease   . Dysrhythmia 04/07/2012    atrial fib  . Sleep apnea     " mild form ,does not use cpap "  . Diabetes mellitus     insulin dependent  . GERD (gastroesophageal reflux disease)   . H/O hiatal hernia   . Arthritis   . MI (myocardial infarction)   . Stented coronary artery   . Atrial fibrillation     Past Surgical History  Procedure Date  . Abdominal hysterectomy   . Cholecystectomy   . Left knee replacement   . Lung surgery     Family History  Problem Relation Age of Onset  . Hypertension Mother   . Coronary artery disease Brother    Social History:  reports that she quit smoking about 27 years ago. Her smoking use included Cigarettes. She has a 20 pack-year smoking history. She has never used smokeless tobacco. She reports that she  does not drink alcohol or use illicit drugs.  Allergies:  Allergies  Allergen Reactions  . Levofloxacin Itching  . Morphine And Related Itching     (Not in a hospital admission)  Results for orders placed during the hospital encounter of 07/06/12 (from the past 48 hour(s))  PROTIME-INR     Status: Abnormal   Collection Time   07/06/12 11:50 PM      Component Value Range Comment   Prothrombin Time 19.3 (*) 11.6 - 15.2 seconds    INR 1.69 (*) 0.00 - 1.49   CBC WITH DIFFERENTIAL     Status: Abnormal   Collection Time   07/06/12 11:50 PM      Component Value Range Comment   WBC 11.4 (*) 4.0 - 10.5 K/uL    RBC 4.25  3.87 - 5.11 MIL/uL    Hemoglobin 12.2  12.0 - 15.0 g/dL    HCT 96.0 (*) 45.4 - 46.0 %    MCV 81.9  78.0 - 100.0 fL    MCH 28.7  26.0 - 34.0 pg    MCHC 35.1  30.0 - 36.0 g/dL    RDW 09.8  11.9 - 14.7 %    Platelets 228  150 - 400 K/uL    Neutrophils Relative 80 (*) 43 - 77 %    Neutro Abs 9.1 (*) 1.7 - 7.7 K/uL    Lymphocytes Relative 13  12 - 46 %  Lymphs Abs 1.5  0.7 - 4.0 K/uL    Monocytes Relative 6  3 - 12 %    Monocytes Absolute 0.7  0.1 - 1.0 K/uL    Eosinophils Relative 1  0 - 5 %    Eosinophils Absolute 0.1  0.0 - 0.7 K/uL    Basophils Relative 0  0 - 1 %    Basophils Absolute 0.0  0.0 - 0.1 K/uL   BASIC METABOLIC PANEL     Status: Abnormal   Collection Time   07/06/12 11:50 PM      Component Value Range Comment   Sodium 134 (*) 135 - 145 mEq/L    Potassium 3.6  3.5 - 5.1 mEq/L    Chloride 96  96 - 112 mEq/L    CO2 29  19 - 32 mEq/L    Glucose, Bld 161 (*) 70 - 99 mg/dL    BUN 12  6 - 23 mg/dL    Creatinine, Ser 1.61  0.50 - 1.10 mg/dL    Calcium 9.6  8.4 - 09.6 mg/dL    GFR calc non Af Amer >90  >90 mL/min    GFR calc Af Amer >90  >90 mL/min   URINALYSIS, ROUTINE W REFLEX MICROSCOPIC     Status: Normal   Collection Time   07/07/12  1:59 AM      Component Value Range Comment   Color, Urine YELLOW  YELLOW    APPearance CLEAR  CLEAR     Specific Gravity, Urine 1.015  1.005 - 1.030    pH 7.0  5.0 - 8.0    Glucose, UA NEGATIVE  NEGATIVE mg/dL    Hgb urine dipstick NEGATIVE  NEGATIVE    Bilirubin Urine NEGATIVE  NEGATIVE    Ketones, ur NEGATIVE  NEGATIVE mg/dL    Protein, ur NEGATIVE  NEGATIVE mg/dL    Urobilinogen, UA 1.0  0.0 - 1.0 mg/dL    Nitrite NEGATIVE  NEGATIVE    Leukocytes, UA NEGATIVE  NEGATIVE MICROSCOPIC NOT DONE ON URINES WITH NEGATIVE PROTEIN, BLOOD, LEUKOCYTES, NITRITE, OR GLUCOSE <1000 mg/dL.   Dg Ankle Complete Left  07/06/2012  *RADIOLOGY REPORT*  Clinical Data: Post fall, now with left ankle pain and swelling  LEFT ANKLE COMPLETE - 3+ VIEW  Comparison: None.  Findings: There is a minimally displaced trimalleolar fracture. The medial malleolar fracture appears to extend to the tibial talar articular surface.  The ankle mortise appears preserved.  The distal tibial fracture appears to extend to the distal tibiofibular joint.  There is expected adjacent soft tissue swelling and small ankle joint effusion.  Small plantar calcaneal spur.  Minimal enthesopathic change of Achilles tendon insertion site.  IMPRESSION: Minimally displaced trimalleolar fracture.   Original Report Authenticated By: Waynard Reeds, M.D.    Dg Knee Complete 4 Views Left  07/06/2012  *RADIOLOGY REPORT*  Clinical Data: Post fall, now with left-sided knee pain  LEFT KNEE - COMPLETE 4+ VIEW  Comparison: Left knee radiographs - 11/26/2011  Findings: Stable sequela of redo left total knee replacement. Minimal periprosthetic lucency about both the femoral and tibial shaft components is grossly unchanged.  Diffuse osteopenia without definite fracture.  No definite joint effusion.  Regional soft tissues are normal.  No radiopaque foreign body.  IMPRESSION: 1. No acute findings.  2.  Stable sequela of redo left total knee replacement.  Unchanged periprosthetic lucency, nonspecific but may be indicative of loosening.   Original Report Authenticated By:  Waynard Reeds,  M.D.     Review of Systems  Constitutional: Negative.   HENT: Negative.   Eyes: Negative.   Respiratory: Negative.   Cardiovascular: Negative.   Gastrointestinal: Negative.   Genitourinary: Negative.   Musculoskeletal:       Left ankle and knee pain.  Skin: Negative.   Neurological: Negative.   Endo/Heme/Allergies: Negative.   Psychiatric/Behavioral: Negative.     Blood pressure 134/54, pulse 66, temperature 98.3 F (36.8 C), temperature source Oral, resp. rate 18, weight 112.038 kg (247 lb), SpO2 95.00%. Physical Exam  Constitutional: She is oriented to person, place, and time. She appears well-developed and well-nourished. No distress.  HENT:  Head: Normocephalic and atraumatic.  Right Ear: External ear normal.  Left Ear: External ear normal.  Nose: Nose normal.  Mouth/Throat: Oropharynx is clear and moist. No oropharyngeal exudate.  Eyes: Conjunctivae normal are normal. Pupils are equal, round, and reactive to light. Right eye exhibits no discharge. Left eye exhibits no discharge. No scleral icterus.  Neck: Normal range of motion. Neck supple.  Cardiovascular: Normal rate and regular rhythm.   Respiratory: Effort normal and breath sounds normal. No respiratory distress. She has no wheezes. She has no rales.  GI: Soft. Bowel sounds are normal. She exhibits no distension. There is no tenderness. There is no rebound.  Musculoskeletal:       Left leg in knee immobilizer.  Neurological: She is alert and oriented to person, place, and time.  Skin: Skin is warm and dry. She is not diaphoretic.  Psychiatric: Her behavior is normal.     Assessment/Plan #1. Left ankle trimalleolar fracture and left knee pain - further recommendations per orthopedic. As per the ER physician discussed with orthopedic surgery the plan is to be managed conservatively. Physical therapy consulted. #2. CAD status post stenting and ischemic cardiomyopathy - presently denies any chest pain  or shortness of breath. Continue Plavix, beta blockers, statin and ACE inhibitors. #3. Diabetes mellitus type 2 - continue home medications with sliding-scale coverage. #4. Hypertension - continue home medications. #5. Atrial fibrillation presently rate controlled - Coumadin per pharmacy. #6. History of lung cancer status post lobectomy.  CODE STATUS - full code.  Kolson Chovanec N. 07/07/2012, 3:13 AM

## 2012-07-07 NOTE — Progress Notes (Signed)
ANTICOAGULATION CONSULT NOTE - Initial Consult  Pharmacy Consult for Heparin Indication: atrial fibrillation  Allergies  Allergen Reactions  . Levofloxacin Itching  . Morphine And Related Itching    Patient Measurements: Height: 5\' 3"  (160 cm) Weight: 247 lb (112.038 kg) IBW/kg (Calculated) : 52.4  Heparin Dosing Weight: 76kg  Vital Signs: Temp: 98.6 F (37 C) (10/11 1400) Temp src: Oral (10/11 1400) BP: 139/77 mmHg (10/11 1400) Pulse Rate: 68  (10/11 1400)  Labs:  Basename 07/07/12 0432 07/06/12 2350  HGB 11.5* 12.2  HCT 33.0* 34.8*  PLT 222 228  APTT -- --  LABPROT 20.1* 19.3*  INR 1.78* 1.69*  HEPARINUNFRC -- --  CREATININE 0.74 0.67  CKTOTAL -- --  CKMB -- --  TROPONINI -- --    Estimated Creatinine Clearance: 87.7 ml/min (by C-G formula based on Cr of 0.74).   Medical History: Past Medical History  Diagnosis Date  . Hypertension   . Hypercholesteremia   . Cancer   . Lung cancer   . Peripheral vascular disease   . Sleep apnea     " mild form ,does not use cpap "  . Diabetes mellitus     insulin dependent  . GERD (gastroesophageal reflux disease)   . H/O hiatal hernia   . Arthritis   . Non-STEMI (non-ST elevated myocardial infarction) 04/07/2012    See cath results below  . Presence of stent in LAD coronary artery 04/07/2012    Xience Expedition DES 2.75 mm x 18 mm (dilated to 3.0 mm)  . Paroxysmal atrial fibrillation 04/07/2012    On Warfarin  . CAD (coronary artery disease), native coronary artery 04/07/2012    Left Ventriculography:EF:  40-45%, Anterior hypokinesis; 99% LAD lesion @ SP1., 30-40% rPDA; otherwise angiographically normal    Medications:  Scheduled:    . cholecalciferol  2,000 Units Oral QHS  . clopidogrel  75 mg Oral QAC breakfast  . lisinopril  20 mg Oral QHS   And  . hydrochlorothiazide  25 mg Oral QHS  . influenza  inactive virus vaccine  0.5 mL Intramuscular Tomorrow-1000  . insulin aspart  0-9 Units Subcutaneous TID WC    . insulin NPH  5-10 Units Subcutaneous QHS  . metoprolol tartrate  50 mg Oral BID  . oxyCODONE-acetaminophen  2 tablet Oral Once  . pantoprazole  40 mg Oral q morning - 10a  . simvastatin  40 mg Oral QHS  . sodium chloride  3 mL Intravenous Q12H  . sodium chloride  3 mL Intravenous Q12H  . Warfarin - Pharmacist Dosing Inpatient   Does not apply q1800  . DISCONTD: lisinopril-hydrochlorothiazide  1 tablet Oral QHS  . DISCONTD: metoprolol tartrate  37.5 mg Oral BID  . DISCONTD: warfarin  8 mg Oral ONCE-1800    Assessment:  Coumadin now on hold for possible ankle surgery  Order received to begin heparin for INR < 2.0, hold 6hrs prior to surgery  Today's INR = 1.78, therefore will begin heparin without a bolus  Goal of Therapy:  Heparin level 0.3-0.7 units/ml Monitor platelets by anticoagulation protocol: Yes   Plan:   Heparin infusion 1100 units/hr (no bolus)  Check heparin level in 8hrs  Follow up date/time of surgery to determine when to hold heparin  Daily heparin level, CBC  Loralee Pacas, PharmD, BCPS Pager: [phone] 07/07/2012,6:05 PM

## 2012-07-07 NOTE — ED Notes (Signed)
MD at bedside. 

## 2012-07-07 NOTE — Progress Notes (Signed)
PT Cancellation Note  Patient Details Name: [name] MRN: [id number] DOB: 27-Aug-1951   Cancelled Treatment:     Evaluation cancelled today due to conservative vs. surgical approach to ankle fx.  Plan per ortho MD is to consult cardiology for possible surgical intervention and recheck radiographs tomorrow.  Will await decisions.    Javis Abboud,KATHrine E 07/07/2012, 12:04 PM Pager: [phone]

## 2012-07-07 NOTE — Consult Note (Signed)
Reason for Consult: surgical clearance for ankle surgery with NSTEMI in July with DES stent to LAD and PAF on coumadin, plavix.  Referring Physician: Dr. Kennith Gain is an 61 y.o. female.    Chief Complaint: admitted due to fall and fractured ankle   HPI: 61 year-old female with history of CAD status post Drug eluding stenting in July 2013 after having non-ST elevation MI, ischemic cardiomyopathy EF 40-40%, diabetes mellitus2, hyperlipidemia, lung cancer status post lobectomy had a fall yesterday while trying to get in the car. Patient hurt her left knee and ankle and was brought to the ER. Patient denies having lost consciousness or hit her head. In the ER x-rays revealed left trimalleolar fracture. Dr. August Saucer on-call orthopedic surgeon was consulted by the ER physician and at this time was placed on knee immobilizer and has been admitted as patient has difficulty walking and will need further assessment with help of physical therapy. Patient otherwise denied any chest pain shortness of breath or nausea vomiting dizziness focal deficits or palpitations.  She had DES Xience placed to LAD 04/07/12 with admit for NSTEMI.  She also had PAF on admit, thought to be due to ischemia.  Outpatient monitor with frequent  A. Fib (episodes lasting ~1 hr) -->  pt now on coumadin.   INR now 1.78  Her EF was reduced ~40-45% by LV Gram with anterior hypokinesis on Left Ventriculography.  No post MI Echo performed (would anticipate improvement post PCI)  Cardiovascular ROS: positive for - irregular heartbeat and palpitations negative for - chest pain, dyspnea on exertion, edema, loss of consciousness, murmur, orthopnea, paroxysmal nocturnal dyspnea, rapid heart rate or shortness of breath.  No syncope or near syncope.  No melena hematochezia or hematuria.  No TIA or amaurosis fugax symptoms. No claudication. She at baseline walks a little limp due to her recovery from her left knee surgery earlier this  year.   Past Medical History  Diagnosis Date  . Hypertension   . Hypercholesteremia   . Cancer   . Lung cancer   . Peripheral vascular disease   . Sleep apnea     " mild form ,does not use cpap "  . Diabetes mellitus     insulin dependent  . GERD (gastroesophageal reflux disease)   . H/O hiatal hernia   . Arthritis   . Non-STEMI (non-ST elevated myocardial infarction) 04/07/2012    See cath results below  . Presence of stent in LAD coronary artery 04/07/2012    Xience Expedition DES 2.75 mm x 18 mm (dilated to 3.0 mm)  . Paroxysmal atrial fibrillation 04/07/2012    On Warfarin  . CAD (coronary artery disease), native coronary artery 04/07/2012    Left Ventriculography:EF:  40-45%, Anterior hypokinesis; 99% LAD lesion @ SP1., 30-40% rPDA; otherwise angiographically normal    Past Surgical History  Procedure Date  . Abdominal hysterectomy   . Cholecystectomy   . Left knee replacement   . Lung surgery   . Percutaneous coronary angioplasty and stenting 04/07/2012    Xience Expedition DES 2.42mm x 18 mm (dilated to 3.0 mm)    Family History  Problem Relation Age of Onset  . Hypertension Mother   . Coronary artery disease Brother    Social History:  reports that she quit smoking about 27 years ago. Her smoking use included Cigarettes. She has a 20 pack-year smoking history. She has never used smokeless tobacco. She reports that she does not drink alcohol or  use illicit drugs.  Allergies:  Allergies  Allergen Reactions  . Levofloxacin Itching  . Morphine And Related Itching    Medications Prior to Admission  Medication Sig Dispense Refill  . albuterol (PROVENTIL HFA;VENTOLIN HFA) 108 (90 BASE) MCG/ACT inhaler Inhale 1-2 puffs into the lungs every 6 (six) hours as needed for wheezing.  1 Inhaler  0  . cholecalciferol (VITAMIN D) 1000 UNITS tablet Take 2,000 Units by mouth at bedtime.      . clopidogrel (PLAVIX) 75 MG tablet Take 75 mg by mouth every morning.       . insulin  NPH (HUMULIN N,NOVOLIN N) 100 UNIT/ML injection Inject 5-10 Units into the skin at bedtime. Sliding scale      . insulin regular (NOVOLIN R,HUMULIN R) 100 units/mL injection Inject 5 Units into the skin 3 (three) times daily before meals.      Marland Kitchen lisinopril-hydrochlorothiazide (PRINZIDE,ZESTORETIC) 20-25 MG per tablet Take 1 tablet by mouth at bedtime.       . metoCLOPramide (REGLAN) 10 MG tablet Take 10 mg by mouth 4 (four) times daily as needed. Nausea      . metoprolol tartrate (LOPRESSOR) 25 MG tablet Take 37.5 mg by mouth 2 (two) times daily.       . naproxen sodium (ANAPROX) 220 MG tablet Take 440 mg by mouth 2 (two) times daily as needed. For pain      . pantoprazole (PROTONIX) 40 MG tablet Take 40 mg by mouth every morning.       . simvastatin (ZOCOR) 40 MG tablet Take 40 mg by mouth at bedtime.       Marland Kitchen warfarin (COUMADIN) 5 MG tablet Take 5-7.5 mg by mouth daily. Takes 5 mg ( 1 tablet) on Sunday, Tuesday and Thursday and takes 7.5 mg (1.5 tablets) Monday, Wednesday, Friday and saturday        Results for orders placed during the hospital encounter of 07/06/12 (from the past 48 hour(s))  PROTIME-INR     Status: Abnormal   Collection Time   07/06/12 11:50 PM      Component Value Range Comment   Prothrombin Time 19.3 (*) 11.6 - 15.2 seconds    INR 1.69 (*) 0.00 - 1.49   CBC WITH DIFFERENTIAL     Status: Abnormal   Collection Time   07/06/12 11:50 PM      Component Value Range Comment   WBC 11.4 (*) 4.0 - 10.5 K/uL    RBC 4.25  3.87 - 5.11 MIL/uL    Hemoglobin 12.2  12.0 - 15.0 g/dL    HCT 16.1 (*) 09.6 - 46.0 %    MCV 81.9  78.0 - 100.0 fL    MCH 28.7  26.0 - 34.0 pg    MCHC 35.1  30.0 - 36.0 g/dL    RDW 04.5  40.9 - 81.1 %    Platelets 228  150 - 400 K/uL    Neutrophils Relative 80 (*) 43 - 77 %    Neutro Abs 9.1 (*) 1.7 - 7.7 K/uL    Lymphocytes Relative 13  12 - 46 %    Lymphs Abs 1.5  0.7 - 4.0 K/uL    Monocytes Relative 6  3 - 12 %    Monocytes Absolute 0.7  0.1 - 1.0  K/uL    Eosinophils Relative 1  0 - 5 %    Eosinophils Absolute 0.1  0.0 - 0.7 K/uL    Basophils Relative 0  0 - 1 %  Basophils Absolute 0.0  0.0 - 0.1 K/uL   BASIC METABOLIC PANEL     Status: Abnormal   Collection Time   07/06/12 11:50 PM      Component Value Range Comment   Sodium 134 (*) 135 - 145 mEq/L    Potassium 3.6  3.5 - 5.1 mEq/L    Chloride 96  96 - 112 mEq/L    CO2 29  19 - 32 mEq/L    Glucose, Bld 161 (*) 70 - 99 mg/dL    BUN 12  6 - 23 mg/dL    Creatinine, Ser 8.29  0.50 - 1.10 mg/dL    Calcium 9.6  8.4 - 56.2 mg/dL    GFR calc non Af Amer >90  >90 mL/min    GFR calc Af Amer >90  >90 mL/min   URINALYSIS, ROUTINE W REFLEX MICROSCOPIC     Status: Normal   Collection Time   07/07/12  1:59 AM      Component Value Range Comment   Color, Urine YELLOW  YELLOW    APPearance CLEAR  CLEAR    Specific Gravity, Urine 1.015  1.005 - 1.030    pH 7.0  5.0 - 8.0    Glucose, UA NEGATIVE  NEGATIVE mg/dL    Hgb urine dipstick NEGATIVE  NEGATIVE    Bilirubin Urine NEGATIVE  NEGATIVE    Ketones, ur NEGATIVE  NEGATIVE mg/dL    Protein, ur NEGATIVE  NEGATIVE mg/dL    Urobilinogen, UA 1.0  0.0 - 1.0 mg/dL    Nitrite NEGATIVE  NEGATIVE    Leukocytes, UA NEGATIVE  NEGATIVE MICROSCOPIC NOT DONE ON URINES WITH NEGATIVE PROTEIN, BLOOD, LEUKOCYTES, NITRITE, OR GLUCOSE <1000 mg/dL.  GLUCOSE, CAPILLARY     Status: Abnormal   Collection Time   07/07/12  3:35 AM      Component Value Range Comment   Glucose-Capillary 142 (*) 70 - 99 mg/dL   BASIC METABOLIC PANEL     Status: Abnormal   Collection Time   07/07/12  4:32 AM      Component Value Range Comment   Sodium 136  135 - 145 mEq/L    Potassium 3.4 (*) 3.5 - 5.1 mEq/L    Chloride 98  96 - 112 mEq/L    CO2 30  19 - 32 mEq/L    Glucose, Bld 182 (*) 70 - 99 mg/dL    BUN 11  6 - 23 mg/dL    Creatinine, Ser 1.30  0.50 - 1.10 mg/dL    Calcium 9.1  8.4 - 86.5 mg/dL    GFR calc non Af Amer 89 (*) >90 mL/min    GFR calc Af Amer >90  >90  mL/min   CBC WITH DIFFERENTIAL     Status: Abnormal   Collection Time   07/07/12  4:32 AM      Component Value Range Comment   WBC 9.1  4.0 - 10.5 K/uL    RBC 4.02  3.87 - 5.11 MIL/uL    Hemoglobin 11.5 (*) 12.0 - 15.0 g/dL    HCT 78.4 (*) 69.6 - 46.0 %    MCV 82.1  78.0 - 100.0 fL    MCH 28.6  26.0 - 34.0 pg    MCHC 34.8  30.0 - 36.0 g/dL    RDW 29.5  28.4 - 13.2 %    Platelets 222  150 - 400 K/uL    Neutrophils Relative 71  43 - 77 %    Neutro Abs 6.4  1.7 - 7.7 K/uL    Lymphocytes Relative 21  12 - 46 %    Lymphs Abs 1.9  0.7 - 4.0 K/uL    Monocytes Relative 6  3 - 12 %    Monocytes Absolute 0.6  0.1 - 1.0 K/uL    Eosinophils Relative 1  0 - 5 %    Eosinophils Absolute 0.1  0.0 - 0.7 K/uL    Basophils Relative 0  0 - 1 %    Basophils Absolute 0.0  0.0 - 0.1 K/uL   PROTIME-INR     Status: Abnormal   Collection Time   07/07/12  4:32 AM      Component Value Range Comment   Prothrombin Time 20.1 (*) 11.6 - 15.2 seconds    INR 1.78 (*) 0.00 - 1.49   GLUCOSE, CAPILLARY     Status: Abnormal   Collection Time   07/07/12  7:18 AM      Component Value Range Comment   Glucose-Capillary 209 (*) 70 - 99 mg/dL   GLUCOSE, CAPILLARY     Status: Abnormal   Collection Time   07/07/12 11:55 AM      Component Value Range Comment   Glucose-Capillary 214 (*) 70 - 99 mg/dL    Dg Ankle Complete Left  07/06/2012  *RADIOLOGY REPORT*  Clinical Data: Post fall, now with left ankle pain and swelling  LEFT ANKLE COMPLETE - 3+ VIEW  Comparison: None.  Findings: There is a minimally displaced trimalleolar fracture. The medial malleolar fracture appears to extend to the tibial talar articular surface.  The ankle mortise appears preserved.  The distal tibial fracture appears to extend to the distal tibiofibular joint.  There is expected adjacent soft tissue swelling and small ankle joint effusion.  Small plantar calcaneal spur.  Minimal enthesopathic change of Achilles tendon insertion site.  IMPRESSION:  Minimally displaced trimalleolar fracture.   Original Report Authenticated By: Waynard Reeds, M.D.    Dg Knee Complete 4 Views Left  07/06/2012  *RADIOLOGY REPORT*  Clinical Data: Post fall, now with left-sided knee pain  LEFT KNEE - COMPLETE 4+ VIEW  Comparison: Left knee radiographs - 11/26/2011  Findings: Stable sequela of redo left total knee replacement. Minimal periprosthetic lucency about both the femoral and tibial shaft components is grossly unchanged.  Diffuse osteopenia without definite fracture.  No definite joint effusion.  Regional soft tissues are normal.  No radiopaque foreign body.  IMPRESSION: 1. No acute findings.  2.  Stable sequela of redo left total knee replacement.  Unchanged periprosthetic lucency, nonspecific but may be indicative of loosening.   Original Report Authenticated By: Waynard Reeds, M.D.     ROS: General:no colds, no fevers Skin:no rashes HEENT:no blurred vision CV:no chest pain PUL:no DOE, no orthopnea  GI:no diarrhea constipation, no melena GU:no hematuria, no dysuria MS:+ lt. Leg pain, and rt leg tenderness Neuro:no syncope no lightheadedness Endo: stable diabetes   Blood pressure 144/72, pulse 71, temperature 98.9 F (37.2 C), temperature source Oral, resp. rate 20, height 5\' 3"  (1.6 m), weight 112.038 kg (247 lb), SpO2 98.00%. PE: General:alert and oriented, pleasant affect NAD, though has pain at fracture site Skin:warm and dry, brisk capillary refill HEENT:normocephalic, sclera clear Neck:supple, no JVD, no bruits  Heart:S1S2 RRR no murmur gallup or rub Lungs:clear without rales or wheezes Abd:+ BS, soft nontender Ext:lt. Foot with splint and elevated, rt leg tender but no edema Neuro:alert and oriented X 3 MAE, follows commands  Assessment/Plan Principal Problem:  *Trimalleolar fracture Active Problems:  DIABETES, TYPE 2  PAF,   CAD (coronary artery disease), LAD DES 04/07/12  Ischemic cardiomyopathy, EF 40-45% cath  04/07/12  PLAN: check EKG, coumadin has been held, plavix continues.   MD to see for further recommendations,  ? Heparin coumadin crossover.    INGOLD,LAURA R 07/07/2012, 12:51 PM  ATTENDING ATTESTATION:  I have seen and examined the patient along with Nada Boozer, NP.  I have reviewed the chart, notes and new data.  I agree with Laura's findings, examination & recommendations as noted above.  Brief Description: Very pleasant 61 year old woman is a patient of Dr. Royann Shivers with a history of moderate to severe peripheral artery disease as well as recent non-ST elevation MI found to have a 99%  proximal to midLAD stenosis treated with a drug-eluting stent. She is also noted to be in atrial fibrillation at this time, however initial thought for this would be ischemic related were discounted as she continued to have episodic paroxysmal A. fib over the following month on monitor.  He suddenly started on warfarin plus Plavix with aspirin being held.  She's been relatively stable from cardiac standpoint since her MI in discharge. She feels palpitations but does not notice rapid heart rates associated with A. Fib. Unfortunately she was admitted after a fall with a fractured ankle. We're asked to consult for possible preoperative assessment.   Key new complaints: No cardiac complaints simply that her leg/knee and ankle hurts  Key examination changes: Exam is relatively benign with exception of the left knee and ankle brace/cast; no edema heart is regular rate and rhythm with no murmurs or gallops. Lungs are clear. No JVD.  Key new findings / data: Ankle fracture is noted. INR 1.78  For preoperative risk assessment, her major risk factors include: diabetes on insulin  She does have Coronary artery disease, she was recently revascularized and has not had further complaints of angina which is therefore not counted as a risk factor  She had reduced ejection fraction of 40-45% and pulmonary edema at time  of her non-ST elevation MI, she had no signs and symptoms of congestive heart failure and chest x-ray as of August 2013 showed no evidence of pulmonary edema -- making this also difficult to consider the risk factor without a followup evaluation of Ejection Fraction and chest x-ray The potential surgery is Low Risk  She would be a Low-Intermediate Operative Risk patient should she require surgery.  Adverse perioperative risks include: fatal or non-fatal MI, VF/VT (cardiac arrest), Afib (expeciallly since she has known pAF), acute CHF --> these risk are mitigated (reduced by ~1/2 with patients on stable dose of BB (& statin) perioperatively.  Other potential concerns for possible surgery relate to her anticoagulation regimen:  With recent DES stent to LAD -- she needs to remain on at least one Antiplatelet agent   if unable to operate on Plavix   ==>, we would need to hold x 5 days & bridge with Integrilin  On Warfarin for Afib --> this can be held & bridged with either IV Heparin (preferred perioperatively) or Lovenox.  Other option would be to consider converting to a newer anticoagulation agent (Xarelto, Eliquis or Pradaxa -- would defer this choice to Dr. Royann Shivers).  Recommendation:  Would check ECG, 2 D Echo (to assess EF- post PCI/NSTEMI) & CXR to confirm absence of CHF  I have increased her BB dose to 50mg  PO BID.  For now would continue Plavix &  hold Warfarin if Surgery is a real consideration (in which case, continue IV Heparin).  Continue Statin & ACE-I along with BB.  We will be happy to be of further assistance.  For now she is stable from a cardiac standpoint. We will review th ECG & Echocardiogram when completed & provide additional recommendations if needed after review.  Please do not hesitate to contact us over the weekend if issues (Angina, CHF or Afib) occur over the weekend or if the decision is made to proceed to the OR.     Marykay Lex, M.D., M.S. [facility]  [facility] [address] [place], [place]  [address]  [phone]  07/07/2012 2:49 PM

## 2012-07-08 DIAGNOSIS — I2589 Other forms of chronic ischemic heart disease: Secondary | ICD-10-CM | POA: Diagnosis not present

## 2012-07-08 DIAGNOSIS — M25569 Pain in unspecified knee: Secondary | ICD-10-CM | POA: Diagnosis not present

## 2012-07-08 DIAGNOSIS — S82853A Displaced trimalleolar fracture of unspecified lower leg, initial encounter for closed fracture: Secondary | ICD-10-CM | POA: Diagnosis not present

## 2012-07-08 DIAGNOSIS — I4891 Unspecified atrial fibrillation: Secondary | ICD-10-CM | POA: Diagnosis not present

## 2012-07-08 DIAGNOSIS — I2510 Atherosclerotic heart disease of native coronary artery without angina pectoris: Secondary | ICD-10-CM | POA: Diagnosis not present

## 2012-07-08 LAB — GLUCOSE, CAPILLARY
Glucose-Capillary: 178 mg/dL — ABNORMAL HIGH (ref 70–99)
Glucose-Capillary: 127 mg/dL — ABNORMAL HIGH (ref 70–99)
Glucose-Capillary: 175 mg/dL — ABNORMAL HIGH (ref 70–99)
Glucose-Capillary: 237 mg/dL — ABNORMAL HIGH (ref 70–99)

## 2012-07-08 LAB — URINE CULTURE: Culture: NO GROWTH

## 2012-07-08 LAB — CBC
MCH: 28.5 pg (ref 26.0–34.0)
Platelets: 183 10*3/uL (ref 150–400)
RBC: 3.72 MIL/uL — ABNORMAL LOW (ref 3.87–5.11)
RDW: 12.9 % (ref 11.5–15.5)
WBC: 8.3 10*3/uL (ref 4.0–10.5)

## 2012-07-08 LAB — PROTIME-INR: Prothrombin Time: 18.9 seconds — ABNORMAL HIGH (ref 11.6–15.2)

## 2012-07-08 MED ORDER — DIPHENHYDRAMINE HCL 25 MG PO CAPS
25.0000 mg | ORAL_CAPSULE | Freq: Four times a day (QID) | ORAL | Status: DC | PRN
  Administered 2012-07-08: 25 mg via ORAL
  Filled 2012-07-08: qty 1

## 2012-07-08 MED ORDER — HEPARIN (PORCINE) IN NACL 100-0.45 UNIT/ML-% IJ SOLN
1300.0000 [IU]/h | INTRAMUSCULAR | Status: DC
  Filled 2012-07-08: qty 250

## 2012-07-08 MED ORDER — DIPHENHYDRAMINE HCL 50 MG/ML IJ SOLN: 12.5000 mg | Freq: Four times a day (QID) | INTRAMUSCULAR | Status: DC | PRN

## 2012-07-08 MED ORDER — HEPARIN (PORCINE) IN NACL 100-0.45 UNIT/ML-% IJ SOLN
1400.0000 [IU]/h | INTRAMUSCULAR | Status: DC
  Administered 2012-07-08: 1400 [IU]/h via INTRAVENOUS
  Filled 2012-07-08 (×4): qty 250

## 2012-07-08 NOTE — Progress Notes (Signed)
Events noted Cardiology -consult - low risk from cardiac standpoint - echo and cxr pending On plavix and heparin - inr still elevated to 1.6  It will be hard to maintain alignment of this unstable fracture in a cast Although cardiac risk has been stratified as low her wound complication risk is moderate at least due to DM and anti coag status and necessity to be on plavix. TKA also at risk from wound complication on this side  Ok to eat today Recheck xrays am, npo after midnight tonight Recheck inr am

## 2012-07-08 NOTE — Progress Notes (Signed)
ANTICOAGULATION CONSULT NOTE - Initial Consult  Pharmacy Consult for Heparin Indication: atrial fibrillation  Allergies  Allergen Reactions  . Levofloxacin Itching  . Morphine And Related Itching    Patient Measurements: Height: 5\' 3"  (160 cm) Weight: 247 lb (112.038 kg) IBW/kg (Calculated) : 52.4  Heparin Dosing Weight: 76kg  Vital Signs: Temp: 98.9 F (37.2 C) (10/12 1119) Temp src: Oral (10/12 1119) BP: 117/68 mmHg (10/12 1119) Pulse Rate: 74  (10/12 1119)  Labs:  Basename 07/08/12 1231 07/08/12 0424 07/07/12 0432 07/06/12 2350  HGB -- -- 11.5* 12.2  HCT -- -- 33.0* 34.8*  PLT -- -- 222 228  APTT -- -- -- --  LABPROT -- 18.9* 20.1* 19.3*  INR -- 1.64* 1.78* 1.69*  HEPARINUNFRC 0.31 0.13* -- --  CREATININE -- -- 0.74 0.67  CKTOTAL -- -- -- --  CKMB -- -- -- --  TROPONINI -- -- -- --    Estimated Creatinine Clearance: 87.7 ml/min (by C-G formula based on Cr of 0.74).   Medical History: Past Medical History  Diagnosis Date  . Hypertension   . Hypercholesteremia   . Cancer   . Lung cancer   . Peripheral vascular disease   . Sleep apnea     " mild form ,does not use cpap "  . Diabetes mellitus     insulin dependent  . GERD (gastroesophageal reflux disease)   . H/O hiatal hernia   . Arthritis   . Non-STEMI (non-ST elevated myocardial infarction) 04/07/2012    See cath results below  . Presence of stent in LAD coronary artery 04/07/2012    Xience Expedition DES 2.75 mm x 18 mm (dilated to 3.0 mm)  . Paroxysmal atrial fibrillation 04/07/2012    On Warfarin  . CAD (coronary artery disease), native coronary artery 04/07/2012    Left Ventriculography:EF:  40-45%, Anterior hypokinesis; 99% LAD lesion @ SP1., 30-40% rPDA; otherwise angiographically normal    Medications:  Scheduled:     . cholecalciferol  2,000 Units Oral QHS  . clopidogrel  75 mg Oral QAC breakfast  . lisinopril  20 mg Oral QHS   And  . hydrochlorothiazide  25 mg Oral QHS  . influenza   inactive virus vaccine  0.5 mL Intramuscular Tomorrow-1000  . insulin aspart  0-9 Units Subcutaneous TID WC  . insulin NPH  5-10 Units Subcutaneous QHS  . metoprolol tartrate  50 mg Oral BID  . pantoprazole  40 mg Oral q morning - 10a  . simvastatin  40 mg Oral QHS  . sodium chloride  3 mL Intravenous Q12H  . sodium chloride  3 mL Intravenous Q12H  . DISCONTD: metoprolol tartrate  37.5 mg Oral BID  . DISCONTD: warfarin  8 mg Oral ONCE-1800  . DISCONTD: Warfarin - Pharmacist Dosing Inpatient   Does not apply q1800    Assessment:  Coumadin now on hold for possible ankle surgery  Order received to begin heparin for INR < 2.0, hold 6hrs prior to surgery  First heparin level subtherapeutic  No bleeding complications noted  Goal of Therapy:  Heparin level 0.3-0.7 units/ml Monitor platelets by anticoagulation protocol: Yes   Plan:   Increase Heparin infusion to 1300 units/hr   Check heparin level in 6hrs  Follow up date/time of surgery to determine when to hold heparin  Will order daily CBC  Terrilee Files, PharmD 07/08/2012,5:28 AM   Addendum  Initial Heparin level is 0.31 on 1300 units/hour No bleeding reported No issues with infusion reported  A/P: HL tx but on low end tx. Will increase to 1400 units/hour and recheck heparin level in 8 hours   Gwen Her PharmD  [phone] 07/08/2012 1:49 PM

## 2012-07-08 NOTE — Progress Notes (Signed)
Clarified with Dr. Ophelia Charter flu vaccine administration . MD ok for vaccine to be given. Dr. Ophelia Charter also aware pt c/o burning sensation across lt ankle with numbness in lle. Instructed to elevate lle more.

## 2012-07-08 NOTE — Evaluation (Signed)
Physical Therapy Evaluation Patient Details Name: [name] MRN: [id number] DOB: 1951-05-14 Today's Date: 07/08/2012 Time: 1478-2956 PT Time Calculation (min): 29 min  PT Assessment / Plan / Recommendation Clinical Impression  pt s/p fall resulting in trimalleor fx of left ankle; pt in splint; surgery possibly...pending?; will continue to follow , regardless of surgery or not pt will need SNf    PT Assessment  Patient needs continued PT services    Follow Up Recommendations  Post acute inpatient    Does the patient have the potential to tolerate intense rehabilitation   No, Recommend SNF  Barriers to Discharge Decreased caregiver support      Equipment Recommendations  Rolling walker with 5" wheels    Recommendations for Other Services     Frequency Min 3X/week    Precautions / Restrictions Precautions Precautions: Fall;Other (comment) Precaution Comments: left ankle splint;  Required Braces or Orthoses: Knee Immobilizer - Left (not specified) Restrictions LLE Weight Bearing: Non weight bearing   Pertinent Vitals/Pain       Mobility  Bed Mobility Bed Mobility: Supine to Sit Supine to Sit: 1: +2 Total assist;HOB elevated Supine to Sit: Patient Percentage: 60% Details for Bed Mobility Assistance: +2 trunk, LLE, increased time Transfers Transfers: Sit to Stand;Stand to Sit;Stand Pivot Transfers Sit to Stand: 1: +2 Total assist Sit to Stand: Patient Percentage: 40% Stand to Sit: 1: +2 Total assist Stand to Sit: Patient Percentage: 40% Stand Pivot Transfers: 1: +2 Total assist Stand Pivot Transfers: Patient Percentage: 20% Details for Transfer Assistance: pt requiring +2  to maintain NWB, balance; cues for sequence, safety Ambulation/Gait Ambulation/Gait Assistance: Other (comment) (unable)    Shoulder Instructions     Exercises     PT Diagnosis: Difficulty walking  PT Problem List: Decreased strength;Decreased range of motion;Decreased activity  tolerance;Decreased balance;Decreased mobility;Obesity;Decreased knowledge of use of DME PT Treatment Interventions: DME instruction;Gait training;Functional mobility training;Therapeutic activities;Therapeutic exercise;Balance training;Patient/family education   PT Goals Acute Rehab PT Goals PT Goal Formulation: With patient Time For Goal Achievement: 07/22/12 Potential to Achieve Goals: Good Pt will go Supine/Side to Sit: with mod assist PT Goal: Supine/Side to Sit - Progress: Goal set today Pt will go Sit to Supine/Side: with mod assist PT Goal: Sit to Supine/Side - Progress: Goal set today Pt will go Sit to Stand: with mod assist PT Goal: Sit to Stand - Progress: Goal set today Pt will go Stand to Sit: with mod assist PT Goal: Stand to Sit - Progress: Goal set today Pt will Transfer Bed to Chair/Chair to Bed: with mod assist PT Transfer Goal: Bed to Chair/Chair to Bed - Progress: Goal set today  Visit Information  Last PT Received On: 07/08/12 Assistance Needed: +2    Subjective Data  Subjective: I can try to sit up Patient Stated Goal: none stated   Prior Functioning  Home Living Lives With: Son (works days) Type of Home: House Home Access: Level entry Home Layout: One level Home Adaptive Equipment: Bedside commode/3-in-1;Walker - rolling (unsure if RW or SW) Prior Function Level of Independence: Independent Communication Communication: No difficulties    Cognition  Overall Cognitive Status: Appears within functional limits for tasks assessed/performed Arousal/Alertness: Awake/alert Orientation Level: Appears intact for tasks assessed Behavior During Session: [facility] for tasks performed    Extremity/Trunk Assessment Right Upper Extremity Assessment RUE ROM/Strength/Tone: [facility] for tasks assessed Left Upper Extremity Assessment LUE ROM/Strength/Tone: [facility] for tasks assessed Right Lower Extremity Assessment RLE ROM/Strength/Tone:  (pt states her right leg is bad)  Left  Lower Extremity Assessment LLE ROM/Strength/Tone: Deficits;Unable to fully assess;Due to pain;Due to precautions   Balance    End of Session PT - End of Session Equipment Utilized During Treatment: Gait belt;Left knee immobilizer Activity Tolerance: Patient limited by pain;Patient limited by fatigue Patient left: in chair;with call bell/phone within reach Nurse Communication: Need for lift equipment (use maxi sky pad to get pt back to bed)  GP Functional Assessment Tool Used: clinical judgement Functional Limitation: Mobility: Walking and moving around Mobility: Walking and Moving Around Current Status ([id number]): At least 80 percent but less than 100 percent impaired, limited or restricted Mobility: Walking and Moving Around Goal Status [phone]): At least 40 percent but less than 60 percent impaired, limited or restricted   [facility] 07/08/2012, 4:23 PM

## 2012-07-08 NOTE — Progress Notes (Signed)
ANTICOAGULATION CONSULT NOTE - Initial Consult  Pharmacy Consult for Heparin Indication: atrial fibrillation  Allergies  Allergen Reactions  . Levofloxacin Itching  . Morphine And Related Itching    Patient Measurements: Height: 5\' 3"  (160 cm) Weight: 247 lb (112.038 kg) IBW/kg (Calculated) : 52.4  Heparin Dosing Weight: 76kg  Vital Signs: Temp: 98.9 F (37.2 C) (10/11 2204) Temp src: Oral (10/11 2204) BP: 168/77 mmHg (10/11 2204) Pulse Rate: 72  (10/11 2204)  Labs:  Basename 07/08/12 0424 07/07/12 0432 07/06/12 2350  HGB -- 11.5* 12.2  HCT -- 33.0* 34.8*  PLT -- 222 228  APTT -- -- --  LABPROT 18.9* 20.1* 19.3*  INR 1.64* 1.78* 1.69*  HEPARINUNFRC 0.13* -- --  CREATININE -- 0.74 0.67  CKTOTAL -- -- --  CKMB -- -- --  TROPONINI -- -- --    Estimated Creatinine Clearance: 87.7 ml/min (by C-G formula based on Cr of 0.74).   Medical History: Past Medical History  Diagnosis Date  . Hypertension   . Hypercholesteremia   . Cancer   . Lung cancer   . Peripheral vascular disease   . Sleep apnea     " mild form ,does not use cpap "  . Diabetes mellitus     insulin dependent  . GERD (gastroesophageal reflux disease)   . H/O hiatal hernia   . Arthritis   . Non-STEMI (non-ST elevated myocardial infarction) 04/07/2012    See cath results below  . Presence of stent in LAD coronary artery 04/07/2012    Xience Expedition DES 2.75 mm x 18 mm (dilated to 3.0 mm)  . Paroxysmal atrial fibrillation 04/07/2012    On Warfarin  . CAD (coronary artery disease), native coronary artery 04/07/2012    Left Ventriculography:EF:  40-45%, Anterior hypokinesis; 99% LAD lesion @ SP1., 30-40% rPDA; otherwise angiographically normal    Medications:  Scheduled:     . cholecalciferol  2,000 Units Oral QHS  . clopidogrel  75 mg Oral QAC breakfast  . lisinopril  20 mg Oral QHS   And  . hydrochlorothiazide  25 mg Oral QHS  . influenza  inactive virus vaccine  0.5 mL Intramuscular  Tomorrow-1000  . insulin aspart  0-9 Units Subcutaneous TID WC  . insulin NPH  5-10 Units Subcutaneous QHS  . metoprolol tartrate  50 mg Oral BID  . pantoprazole  40 mg Oral q morning - 10a  . simvastatin  40 mg Oral QHS  . sodium chloride  3 mL Intravenous Q12H  . sodium chloride  3 mL Intravenous Q12H  . DISCONTD: metoprolol tartrate  37.5 mg Oral BID  . DISCONTD: warfarin  8 mg Oral ONCE-1800  . DISCONTD: Warfarin - Pharmacist Dosing Inpatient   Does not apply q1800    Assessment:  Coumadin now on hold for possible ankle surgery  Order received to begin heparin for INR < 2.0, hold 6hrs prior to surgery  First heparin level subtherapeutic  No bleeding complications noted  Goal of Therapy:  Heparin level 0.3-0.7 units/ml Monitor platelets by anticoagulation protocol: Yes   Plan:   Increase Heparin infusion to 1300 units/hr   Check heparin level in 6hrs  Follow up date/time of surgery to determine when to hold heparin  Will order daily CBC  Terrilee Files, PharmD 07/08/2012,5:28 AM

## 2012-07-08 NOTE — Progress Notes (Signed)
*  PRELIMINARY RESULTS* Echocardiogram 2D Echocardiogram has been performed.  [name] 07/08/2012, 10:50 AM

## 2012-07-08 NOTE — Progress Notes (Signed)
[facility]             Progress Note   Subjective: Says her left knee and ankle are bothering her.  Objective: Vital signs in last 24 hours: Temp:  [98.6 F (37 C)-99.7 F (37.6 C)] 98.9 F (37.2 C) (10/12 1119) Pulse Rate:  [68-76] 74  (10/12 1119) Resp:  [17-18] 17  (10/12 1119) BP: (117-168)/(68-77) 117/68 mmHg (10/12 1119) SpO2:  [93 %-97 %] 95 % (10/12 1119) Weight change:  Last BM Date: 07/06/12 (stated pt)  Intake/Output from previous day: 10/11 0701 - 10/12 0700 In: 400.2 [P.O.:240; I.V.:160.2] Out: 1150 [Urine:1150]     Physical Exam: General: Alert, awake, oriented x3, in no acute distress. HEENT: No bruits, no goiter. Heart: Regular rate and rhythm, without murmurs, rubs, gallops. Lungs: Clear to auscultation bilaterally. Abdomen: Soft, nontender, nondistended, positive bowel sounds. Extremities: No clubbing cyanosis or edema with positive pedal pulses. Neuro: Grossly intact, nonfocal.    Lab Results: Basic Metabolic Panel:  Basename 07/07/12 0432 07/06/12 2350  NA 136 134*  K 3.4* 3.6  CL 98 96  CO2 30 29  GLUCOSE 182* 161*  BUN 11 12  CREATININE 0.74 0.67  CALCIUM 9.1 9.6  MG -- --  PHOS -- --   CBC:  Basename 07/07/12 0432 07/06/12 2350  WBC 9.1 11.4*  NEUTROABS 6.4 9.1*  HGB 11.5* 12.2  HCT 33.0* 34.8*  MCV 82.1 81.9  PLT 222 228   CBG:  Basename 07/08/12 1221 07/08/12 0806 07/07/12 2158 07/07/12 1712 07/07/12 1155 07/07/12 0718  GLUCAP 127* 178* 211* 241* 214* 209*   Coagulation:  Basename 07/08/12 0424 07/07/12 0432  LABPROT 18.9* 20.1*  INR 1.64* 1.78*   Urinalysis:  Basename 07/07/12 0159  COLORURINE YELLOW  LABSPEC 1.015  PHURINE 7.0  GLUCOSEU NEGATIVE  HGBUR NEGATIVE  BILIRUBINUR NEGATIVE  KETONESUR NEGATIVE  PROTEINUR NEGATIVE  UROBILINOGEN 1.0  NITRITE NEGATIVE  LEUKOCYTESUR NEGATIVE    Studies/Results: Dg Ankle Complete Left  07/06/2012  *RADIOLOGY REPORT*  Clinical Data: Post fall,  now with left ankle pain and swelling  LEFT ANKLE COMPLETE - 3+ VIEW  Comparison: None.  Findings: There is a minimally displaced trimalleolar fracture. The medial malleolar fracture appears to extend to the tibial talar articular surface.  The ankle mortise appears preserved.  The distal tibial fracture appears to extend to the distal tibiofibular joint.  There is expected adjacent soft tissue swelling and small ankle joint effusion.  Small plantar calcaneal spur.  Minimal enthesopathic change of Achilles tendon insertion site.  IMPRESSION: Minimally displaced trimalleolar fracture.   Original Report Authenticated By: Waynard Reeds, M.D.    Dg Knee Complete 4 Views Left  07/06/2012  *RADIOLOGY REPORT*  Clinical Data: Post fall, now with left-sided knee pain  LEFT KNEE - COMPLETE 4+ VIEW  Comparison: Left knee radiographs - 11/26/2011  Findings: Stable sequela of redo left total knee replacement. Minimal periprosthetic lucency about both the femoral and tibial shaft components is grossly unchanged.  Diffuse osteopenia without definite fracture.  No definite joint effusion.  Regional soft tissues are normal.  No radiopaque foreign body.  IMPRESSION: 1. No acute findings.  2.  Stable sequela of redo left total knee replacement.  Unchanged periprosthetic lucency, nonspecific but may be indicative of loosening.   Original Report Authenticated By: Waynard Reeds, M.D.     Medications: Scheduled Meds:    . cholecalciferol  2,000 Units Oral QHS  . clopidogrel  75 mg Oral QAC  breakfast  . lisinopril  20 mg Oral QHS   And  . hydrochlorothiazide  25 mg Oral QHS  . influenza  inactive virus vaccine  0.5 mL Intramuscular Tomorrow-1000  . insulin aspart  0-9 Units Subcutaneous TID WC  . insulin NPH  5-10 Units Subcutaneous QHS  . metoprolol tartrate  50 mg Oral BID  . pantoprazole  40 mg Oral q morning - 10a  . simvastatin  40 mg Oral QHS  . sodium chloride  3 mL Intravenous Q12H  . sodium chloride  3  mL Intravenous Q12H  . DISCONTD: metoprolol tartrate  37.5 mg Oral BID  . DISCONTD: warfarin  8 mg Oral ONCE-1800  . DISCONTD: Warfarin - Pharmacist Dosing Inpatient   Does not apply q1800   Continuous Infusions:    . heparin 1,300 Units/hr (07/08/12 0559)  . DISCONTD: heparin 1,100 Units/hr (07/07/12 1957)   PRN Meds:.sodium chloride, acetaminophen, acetaminophen, albuterol, HYDROmorphone (DILAUDID) injection, metoCLOPramide, ondansetron (ZOFRAN) IV, ondansetron, sodium chloride  Assessment/Plan:  Principal Problem:  *Trimalleolar fracture Active Problems:  DIABETES, TYPE 2  PAF,   CAD (coronary artery disease), LAD DES 04/07/12  Ischemic cardiomyopathy, EF 40-45% cath 04/07/12   Ankle Fracture -Plan for OR as per ortho. -Appears to be scheduled for tomorrow from what I can infer from Dr. Diamantina Providence note.  CAD/AFib -Stable. -Cardiology involved and adjusting anticoagulation. -INR down to 1.6 after stopping coumadin. -Transitioned to a heparin drip. -Needs to remain on plavix given recent DES placement.  Time spent coordinating care: 25 minutes   LOS: 2 days   [facility] Pager: [phone] 07/08/2012, 12:33 PM

## 2012-07-09 ENCOUNTER — Observation Stay (HOSPITAL_COMMUNITY): Payer: Medicare Other

## 2012-07-09 ENCOUNTER — Encounter (HOSPITAL_COMMUNITY)
Admission: EM | Disposition: A | Discharge status: Skilled Nursing Facility | Payer: Self-pay | Source: Home / Self Care | Attending: Internal Medicine

## 2012-07-09 ENCOUNTER — Observation Stay (HOSPITAL_COMMUNITY): Payer: Medicare Other | Admitting: Registered Nurse

## 2012-07-09 ENCOUNTER — Encounter (HOSPITAL_COMMUNITY): Payer: Self-pay | Admitting: Registered Nurse

## 2012-07-09 DIAGNOSIS — Z6841 Body Mass Index (BMI) 40.0 and over, adult: Secondary | ICD-10-CM | POA: Diagnosis not present

## 2012-07-09 DIAGNOSIS — I2510 Atherosclerotic heart disease of native coronary artery without angina pectoris: Secondary | ICD-10-CM | POA: Diagnosis not present

## 2012-07-09 DIAGNOSIS — Z7901 Long term (current) use of anticoagulants: Secondary | ICD-10-CM | POA: Diagnosis not present

## 2012-07-09 DIAGNOSIS — S82853A Displaced trimalleolar fracture of unspecified lower leg, initial encounter for closed fracture: Secondary | ICD-10-CM | POA: Diagnosis not present

## 2012-07-09 DIAGNOSIS — D62 Acute posthemorrhagic anemia: Secondary | ICD-10-CM | POA: Diagnosis not present

## 2012-07-09 DIAGNOSIS — I10 Essential (primary) hypertension: Secondary | ICD-10-CM | POA: Diagnosis not present

## 2012-07-09 DIAGNOSIS — M25569 Pain in unspecified knee: Secondary | ICD-10-CM | POA: Diagnosis present

## 2012-07-09 DIAGNOSIS — E785 Hyperlipidemia, unspecified: Secondary | ICD-10-CM | POA: Diagnosis not present

## 2012-07-09 DIAGNOSIS — R262 Difficulty in walking, not elsewhere classified: Secondary | ICD-10-CM | POA: Diagnosis not present

## 2012-07-09 DIAGNOSIS — S99929A Unspecified injury of unspecified foot, initial encounter: Secondary | ICD-10-CM | POA: Diagnosis not present

## 2012-07-09 DIAGNOSIS — IMO0002 Reserved for concepts with insufficient information to code with codable children: Secondary | ICD-10-CM | POA: Diagnosis not present

## 2012-07-09 DIAGNOSIS — S8990XA Unspecified injury of unspecified lower leg, initial encounter: Secondary | ICD-10-CM | POA: Diagnosis not present

## 2012-07-09 DIAGNOSIS — M25579 Pain in unspecified ankle and joints of unspecified foot: Secondary | ICD-10-CM | POA: Diagnosis not present

## 2012-07-09 DIAGNOSIS — S8253XA Displaced fracture of medial malleolus of unspecified tibia, initial encounter for closed fracture: Secondary | ICD-10-CM | POA: Diagnosis not present

## 2012-07-09 DIAGNOSIS — E119 Type 2 diabetes mellitus without complications: Secondary | ICD-10-CM | POA: Diagnosis not present

## 2012-07-09 DIAGNOSIS — Z9861 Coronary angioplasty status: Secondary | ICD-10-CM | POA: Diagnosis not present

## 2012-07-09 DIAGNOSIS — I4891 Unspecified atrial fibrillation: Secondary | ICD-10-CM | POA: Diagnosis not present

## 2012-07-09 DIAGNOSIS — K219 Gastro-esophageal reflux disease without esophagitis: Secondary | ICD-10-CM | POA: Diagnosis not present

## 2012-07-09 DIAGNOSIS — I252 Old myocardial infarction: Secondary | ICD-10-CM | POA: Diagnosis not present

## 2012-07-09 DIAGNOSIS — IMO0001 Reserved for inherently not codable concepts without codable children: Secondary | ICD-10-CM | POA: Diagnosis not present

## 2012-07-09 DIAGNOSIS — I2589 Other forms of chronic ischemic heart disease: Secondary | ICD-10-CM | POA: Diagnosis present

## 2012-07-09 DIAGNOSIS — Z96659 Presence of unspecified artificial knee joint: Secondary | ICD-10-CM | POA: Diagnosis not present

## 2012-07-09 DIAGNOSIS — C3490 Malignant neoplasm of unspecified part of unspecified bronchus or lung: Secondary | ICD-10-CM | POA: Diagnosis not present

## 2012-07-09 DIAGNOSIS — K449 Diaphragmatic hernia without obstruction or gangrene: Secondary | ICD-10-CM | POA: Diagnosis present

## 2012-07-09 DIAGNOSIS — M6281 Muscle weakness (generalized): Secondary | ICD-10-CM | POA: Diagnosis not present

## 2012-07-09 DIAGNOSIS — S82899A Other fracture of unspecified lower leg, initial encounter for closed fracture: Secondary | ICD-10-CM | POA: Diagnosis not present

## 2012-07-09 DIAGNOSIS — D649 Anemia, unspecified: Secondary | ICD-10-CM | POA: Diagnosis not present

## 2012-07-09 HISTORY — PX: ORIF ANKLE FRACTURE: SHX5408

## 2012-07-09 LAB — CBC
Hemoglobin: 10.7 g/dL — ABNORMAL LOW (ref 12.0–15.0)
MCH: 28.3 pg (ref 26.0–34.0)
RBC: 3.78 MIL/uL — ABNORMAL LOW (ref 3.87–5.11)
WBC: 8.3 10*3/uL (ref 4.0–10.5)

## 2012-07-09 LAB — GLUCOSE, CAPILLARY
Glucose-Capillary: 150 mg/dL — ABNORMAL HIGH (ref 70–99)
Glucose-Capillary: 135 mg/dL — ABNORMAL HIGH (ref 70–99)

## 2012-07-09 LAB — HEPARIN LEVEL (UNFRACTIONATED): Heparin Unfractionated: 0.69 IU/mL (ref 0.30–0.70)

## 2012-07-09 LAB — PROTIME-INR: INR: 1.32 (ref 0.00–1.49)

## 2012-07-09 SURGERY — OPEN REDUCTION INTERNAL FIXATION (ORIF) ANKLE FRACTURE
Anesthesia: General | Site: Ankle | Laterality: Left | Wound class: Clean

## 2012-07-09 MED ORDER — METOCLOPRAMIDE HCL 5 MG/ML IJ SOLN: 5.0000 mg | Freq: Three times a day (TID) | INTRAMUSCULAR | Status: DC | PRN

## 2012-07-09 MED ORDER — HYDROMORPHONE HCL PF 1 MG/ML IJ SOLN
INTRAMUSCULAR | Status: DC | PRN
  Administered 2012-07-09 (×4): 0.5 mg via INTRAVENOUS

## 2012-07-09 MED ORDER — LACTATED RINGERS IV SOLN
INTRAVENOUS | Status: DC | PRN
  Administered 2012-07-09 (×2): via INTRAVENOUS

## 2012-07-09 MED ORDER — NEOSTIGMINE METHYLSULFATE 1 MG/ML IJ SOLN
INTRAMUSCULAR | Status: DC | PRN
  Administered 2012-07-09: 3 mg via INTRAVENOUS

## 2012-07-09 MED ORDER — HEPARIN (PORCINE) IN NACL 100-0.45 UNIT/ML-% IJ SOLN
1400.0000 [IU]/h | INTRAMUSCULAR | Status: AC
  Administered 2012-07-10 – 2012-07-12 (×3): 1400 [IU]/h via INTRAVENOUS
  Filled 2012-07-09 (×6): qty 250

## 2012-07-09 MED ORDER — WARFARIN - PHARMACIST DOSING INPATIENT: Freq: Every day | Status: DC

## 2012-07-09 MED ORDER — OXYCODONE HCL 5 MG PO TABS
5.0000 mg | ORAL_TABLET | ORAL | Status: DC | PRN
  Administered 2012-07-12 (×2): 10 mg via ORAL
  Filled 2012-07-09 (×2): qty 2

## 2012-07-09 MED ORDER — GLYCOPYRROLATE 0.2 MG/ML IJ SOLN
INTRAMUSCULAR | Status: DC | PRN
  Administered 2012-07-09: 0.4 mg via INTRAVENOUS

## 2012-07-09 MED ORDER — THROMBIN 20000 UNITS EX KIT
20000.0000 [IU] | PACK | Freq: Once | CUTANEOUS | Status: AC
  Administered 2012-07-09: 20000 [IU] via TOPICAL
  Filled 2012-07-09: qty 1

## 2012-07-09 MED ORDER — DOCUSATE SODIUM 100 MG PO CAPS
100.0000 mg | ORAL_CAPSULE | Freq: Two times a day (BID) | ORAL | Status: DC
  Administered 2012-07-10 – 2012-07-12 (×5): 100 mg via ORAL
  Filled 2012-07-09 (×7): qty 1

## 2012-07-09 MED ORDER — CEFAZOLIN SODIUM-DEXTROSE 2-3 GM-% IV SOLR
INTRAVENOUS | Status: DC | PRN
  Administered 2012-07-09: 2 g via INTRAVENOUS

## 2012-07-09 MED ORDER — ONDANSETRON HCL 4 MG PO TABS: 4.0000 mg | ORAL_TABLET | Freq: Four times a day (QID) | ORAL | Status: DC | PRN

## 2012-07-09 MED ORDER — DIPHENHYDRAMINE HCL 50 MG/ML IJ SOLN: 12.5000 mg | Freq: Four times a day (QID) | INTRAMUSCULAR | Status: DC | PRN

## 2012-07-09 MED ORDER — HYDROMORPHONE HCL PF 1 MG/ML IJ SOLN
0.2500 mg | INTRAMUSCULAR | Status: DC | PRN
  Administered 2012-07-09 (×2): 0.5 mg via INTRAVENOUS

## 2012-07-09 MED ORDER — METOCLOPRAMIDE HCL 10 MG PO TABS: 5.0000 mg | ORAL_TABLET | Freq: Three times a day (TID) | ORAL | Status: DC | PRN

## 2012-07-09 MED ORDER — SODIUM CHLORIDE 0.9 % IJ SOLN: 9.0000 mL | INTRAMUSCULAR | Status: DC | PRN

## 2012-07-09 MED ORDER — CEFAZOLIN SODIUM 1-5 GM-% IV SOLN
1.0000 g | Freq: Three times a day (TID) | INTRAVENOUS | Status: AC
  Administered 2012-07-10 (×2): 1 g via INTRAVENOUS
  Filled 2012-07-09 (×2): qty 50

## 2012-07-09 MED ORDER — DIPHENHYDRAMINE HCL 12.5 MG/5ML PO ELIX: 12.5000 mg | ORAL_SOLUTION | Freq: Four times a day (QID) | ORAL | Status: DC | PRN

## 2012-07-09 MED ORDER — WARFARIN SODIUM 10 MG PO TABS
10.0000 mg | ORAL_TABLET | Freq: Once | ORAL | Status: AC
  Administered 2012-07-09: 10 mg via ORAL
  Filled 2012-07-09: qty 1

## 2012-07-09 MED ORDER — SUCCINYLCHOLINE CHLORIDE 20 MG/ML IJ SOLN
INTRAMUSCULAR | Status: DC | PRN
  Administered 2012-07-09: 100 mg via INTRAVENOUS

## 2012-07-09 MED ORDER — HYDROMORPHONE HCL PF 1 MG/ML IJ SOLN: 0.5000 mg | INTRAMUSCULAR | Status: DC | PRN

## 2012-07-09 MED ORDER — LIDOCAINE HCL (CARDIAC) 20 MG/ML IV SOLN
INTRAVENOUS | Status: DC | PRN
  Administered 2012-07-09: 30 mg via INTRAVENOUS

## 2012-07-09 MED ORDER — CISATRACURIUM BESYLATE (PF) 10 MG/5ML IV SOLN
INTRAVENOUS | Status: DC | PRN
  Administered 2012-07-09: 6 mg via INTRAVENOUS

## 2012-07-09 MED ORDER — POTASSIUM CHLORIDE IN NACL 20-0.9 MEQ/L-% IV SOLN
INTRAVENOUS | Status: AC
  Administered 2012-07-09: 23:00:00 via INTRAVENOUS
  Filled 2012-07-09 (×2): qty 1000

## 2012-07-09 MED ORDER — NALOXONE HCL 0.4 MG/ML IJ SOLN: 0.4000 mg | INTRAMUSCULAR | Status: DC | PRN

## 2012-07-09 MED ORDER — PROPOFOL 10 MG/ML IV BOLUS
INTRAVENOUS | Status: DC | PRN
  Administered 2012-07-09: 150 mg via INTRAVENOUS

## 2012-07-09 MED ORDER — LACTATED RINGERS IV SOLN
INTRAVENOUS | Status: DC
  Administered 2012-07-11: 03:00:00 via INTRAVENOUS

## 2012-07-09 MED ORDER — METHOCARBAMOL 500 MG PO TABS
500.0000 mg | ORAL_TABLET | Freq: Four times a day (QID) | ORAL | Status: DC | PRN
  Administered 2012-07-12: 500 mg via ORAL
  Filled 2012-07-09: qty 1

## 2012-07-09 MED ORDER — ONDANSETRON HCL 4 MG/2ML IJ SOLN: 4.0000 mg | Freq: Four times a day (QID) | INTRAMUSCULAR | Status: DC | PRN

## 2012-07-09 MED ORDER — HYDROMORPHONE 0.3 MG/ML IV SOLN
INTRAVENOUS | Status: DC
  Administered 2012-07-09: 22:00:00 via INTRAVENOUS
  Administered 2012-07-10: 1.5 mg via INTRAVENOUS
  Administered 2012-07-10: 0.9 mg via INTRAVENOUS
  Administered 2012-07-11: 2.22 mg via INTRAVENOUS
  Administered 2012-07-11: 2.82 mg via INTRAVENOUS
  Administered 2012-07-11: 0.3 mg via INTRAVENOUS
  Administered 2012-07-11: 0.9 mg via INTRAVENOUS
  Administered 2012-07-11: 1.92 mg via INTRAVENOUS
  Administered 2012-07-12: 1.5 mg via INTRAVENOUS
  Administered 2012-07-12: 1.2 mg via INTRAVENOUS
  Administered 2012-07-12: 0.6 mg via INTRAVENOUS
  Filled 2012-07-09: qty 25

## 2012-07-09 MED ORDER — FENTANYL CITRATE 0.05 MG/ML IJ SOLN
INTRAMUSCULAR | Status: DC | PRN
  Administered 2012-07-09 (×5): 50 ug via INTRAVENOUS

## 2012-07-09 MED ORDER — 0.9 % SODIUM CHLORIDE (POUR BTL) OPTIME
TOPICAL | Status: DC | PRN
  Administered 2012-07-09: 5000 mL

## 2012-07-09 MED ORDER — ACETAMINOPHEN 10 MG/ML IV SOLN
INTRAVENOUS | Status: DC | PRN
  Administered 2012-07-09: 1000 mg via INTRAVENOUS

## 2012-07-09 MED ORDER — METHOCARBAMOL 100 MG/ML IJ SOLN
500.0000 mg | Freq: Four times a day (QID) | INTRAVENOUS | Status: DC | PRN
  Filled 2012-07-09: qty 5

## 2012-07-09 SURGICAL SUPPLY — 56 items
BAG SPEC THK2 15X12 ZIP CLS (MISCELLANEOUS) ×1
BAG ZIPLOCK 12X15 (MISCELLANEOUS) ×2 IMPLANT
BANDAGE ELASTIC 4 VELCRO ST LF (GAUZE/BANDAGES/DRESSINGS) ×2 IMPLANT
BANDAGE ELASTIC 6 VELCRO ST LF (GAUZE/BANDAGES/DRESSINGS) ×2 IMPLANT
BIT DRILL 2.7 QC CANN 155 (BIT) ×2 IMPLANT
BIT DRILL 3.5 QC 155 (BIT) ×2 IMPLANT
BIT DRILL QC 2.7 6.3IN  SHORT (BIT) ×1
BIT DRILL QC 2.7 6.3IN SHORT (BIT) ×1 IMPLANT
BNDG CMPR 9X4 STRL LF SNTH (GAUZE/BANDAGES/DRESSINGS)
BNDG ESMARK 4X9 LF (GAUZE/BANDAGES/DRESSINGS) IMPLANT
CLOTH BEACON ORANGE TIMEOUT ST (SAFETY) ×2 IMPLANT
COVER SURGICAL LIGHT HANDLE (MISCELLANEOUS) ×2 IMPLANT
CUFF TOURN SGL QUICK 34 (TOURNIQUET CUFF) ×2
CUFF TRNQT CYL 34X4X40X1 (TOURNIQUET CUFF) ×1 IMPLANT
DECANTER SPIKE VIAL GLASS SM (MISCELLANEOUS) ×2 IMPLANT
DRAPE C-ARM 42X72 X-RAY (DRAPES) ×2 IMPLANT
DRAPE U-SHAPE 47X51 STRL (DRAPES) ×4 IMPLANT
DRSG PAD ABDOMINAL 8X10 ST (GAUZE/BANDAGES/DRESSINGS) ×2 IMPLANT
ELECT REM PT RETURN 9FT ADLT (ELECTROSURGICAL) ×2
ELECTRODE REM PT RTRN 9FT ADLT (ELECTROSURGICAL) ×1 IMPLANT
GAUZE XEROFORM 1X8 LF (GAUZE/BANDAGES/DRESSINGS) IMPLANT
GAUZE XEROFORM 5X9 LF (GAUZE/BANDAGES/DRESSINGS) ×2 IMPLANT
GLOVE SURG ORTHO 8.0 STRL STRW (GLOVE) ×2 IMPLANT
GOWN STRL NON-REIN LRG LVL3 (GOWN DISPOSABLE) ×2 IMPLANT
GUIDE PIN 1.3 (Pin) ×4 IMPLANT
NEEDLE HYPO 22GX1.5 SAFETY (NEEDLE) IMPLANT
NS IRRIG 1000ML POUR BTL (IV SOLUTION) ×2 IMPLANT
PACK LOWER EXTREMITY WL (CUSTOM PROCEDURE TRAY) ×2 IMPLANT
PAD CAST 4YDX4 CTTN HI CHSV (CAST SUPPLIES) ×3 IMPLANT
PADDING CAST ABS 6INX4YD NS (CAST SUPPLIES) ×1
PADDING CAST ABS COTTON 6X4 NS (CAST SUPPLIES) ×1 IMPLANT
PADDING CAST COTTON 4X4 STRL (CAST SUPPLIES) ×6
PADDING CAST COTTON 6X4 STRL (CAST SUPPLIES) ×2 IMPLANT
PIN GUIDE 1.3 (Pin) ×2 IMPLANT
PLATE FIBULA DISTAL 5 HOLE (Plate) ×2 IMPLANT
POSITIONER SURGICAL ARM (MISCELLANEOUS) ×2 IMPLANT
SCREW CANN 4.0 46MM (Screw) ×4 IMPLANT
SCREW LOCK 10X3.5XST NS (Screw) ×2 IMPLANT
SCREW LOCK 12X3.5XST PRLC (Screw) ×3 IMPLANT
SCREW LOCK 3.5X10 (Screw) ×4 IMPLANT
SCREW LOCK 3.5X12 (Screw) ×6 IMPLANT
SCREW NLCK 16X3.5XST CORT PRLC (Screw) ×2 IMPLANT
SCREW NON LOCK 3.5X12 (Screw) ×4 IMPLANT
SCREW NONLOCK 3.5X10 (Screw) ×4 IMPLANT
SCREW NONLOCK 3.5X16 (Screw) ×4 IMPLANT
SCREW NONLOCK 3.5X26 (Screw) ×2 IMPLANT
SPONGE GAUZE 4X4 12PLY (GAUZE/BANDAGES/DRESSINGS) ×2 IMPLANT
SPONGE LAP 4X18 X RAY DECT (DISPOSABLE) ×2 IMPLANT
STOCKINETTE 8 INCH (MISCELLANEOUS) ×2 IMPLANT
SUT ETHILON 3 0 PS 1 (SUTURE) ×12 IMPLANT
SUT ETHILON 4 0 PS 2 18 (SUTURE) ×2 IMPLANT
SUT VIC AB 2-0 CT1 27 (SUTURE) ×6
SUT VIC AB 2-0 CT1 TAPERPNT 27 (SUTURE) ×3 IMPLANT
SYR CONTROL 10ML LL (SYRINGE) IMPLANT
TOWEL OR 17X26 10 PK STRL BLUE (TOWEL DISPOSABLE) ×4 IMPLANT
WATER STERILE IRR 1500ML POUR (IV SOLUTION) IMPLANT

## 2012-07-09 NOTE — Progress Notes (Signed)
inr 1.3 Ankle fracture alignment changed for the worse Plan or today later - will hold heparin Pt needs consent Stay npo

## 2012-07-09 NOTE — Anesthesia Preprocedure Evaluation (Addendum)
Anesthesia Evaluation  Patient identified by MRN, date of birth, ID band Patient awake    Reviewed: Allergy & Precautions, H&P , NPO status , Patient's Chart, lab work & pertinent test results, reviewed documented beta blocker date and time   Airway Mallampati: III TM Distance: >3 FB Neck ROM: full    Dental  (+) Edentulous Upper and Edentulous Lower   Pulmonary sleep apnea ,  Lung CA.  VATS lobectomy 5/10. breath sounds clear to auscultation  Pulmonary exam normal       Cardiovascular hypertension, Pt. on home beta blockers + CAD, + Past MI and + Cardiac Stents + dysrhythmias Atrial Fibrillation Rhythm:regular Rate:Normal  7/13 non-STEMI.  Stent in LAD (DES).  On coumadin for A.F.  EF 40%.  NSR on ECG 10/11.   Neuro/Psych negative neurological ROS  negative psych ROS   GI/Hepatic Neg liver ROS, hiatal hernia, GERD-  Medicated and Controlled,  Endo/Other  diabetes, Well Controlled, Type 2, Insulin Dependent  Renal/GU negative Renal ROS  negative genitourinary   Musculoskeletal   Abdominal   Peds  Hematology negative hematology ROS (+) Blood dyscrasia, anemia , Hgb. 10.7   Anesthesia Other Findings   Reproductive/Obstetrics negative OB ROS                         Anesthesia Physical Anesthesia Plan  ASA: III  Anesthesia Plan: General   Post-op Pain Management:    Induction: Intravenous  Airway Management Planned: Oral ETT  Additional Equipment:   Intra-op Plan:   Post-operative Plan: Extubation in OR  Informed Consent: I have reviewed the patients History and Physical, chart, labs and discussed the procedure including the risks, benefits and alternatives for the proposed anesthesia with the patient or authorized representative who has indicated his/her understanding and acceptance.   Dental Advisory Given  Plan Discussed with: CRNA and Surgeon  Anesthesia Plan Comments:          Anesthesia Quick Evaluation

## 2012-07-09 NOTE — Progress Notes (Signed)
[facility]             Progress Note   Subjective: To the OR today for repair of left ankle fracture.  Objective: Vital signs in last 24 hours: Temp:  [98.9 F (37.2 C)-99.8 F (37.7 C)] 98.9 F (37.2 C) (10/13 0620) Pulse Rate:  [62-78] 62  (10/13 1016) Resp:  [16-18] 18  (10/13 0620) BP: (122-148)/(58-73) 122/58 mmHg (10/13 1016) SpO2:  [94 %-98 %] 94 % (10/13 0620) Weight change:  Last BM Date: 07/06/12  Intake/Output from previous day: 10/12 0701 - 10/13 0700 In: 1158.4 [P.O.:480; I.V.:678.4] Out: 700 [Urine:700]     Physical Exam: General: Alert, awake, oriented x3. HEENT: No bruits, no goiter. Heart: Regular rate and rhythm, without murmurs, rubs, gallops. Lungs: Clear to auscultation bilaterally. Abdomen: Soft, nontender, nondistended, positive bowel sounds. Extremities: No clubbing cyanosis or edema. Neuro: Grossly intact, nonfocal.    Lab Results: Basic Metabolic Panel:  Basename 07/07/12 0432 07/06/12 2350  NA 136 134*  K 3.4* 3.6  CL 98 96  CO2 30 29  GLUCOSE 182* 161*  BUN 11 12  CREATININE 0.74 0.67  CALCIUM 9.1 9.6  MG -- --  PHOS -- --   CBC:  Basename 07/09/12 0557 07/08/12 2214 07/07/12 0432 07/06/12 2350  WBC 8.3 8.3 -- --  NEUTROABS -- -- 6.4 9.1*  HGB 10.7* 10.6* -- --  HCT 31.5* 31.1* -- --  MCV 83.3 83.6 -- --  PLT 189 183 -- --   CBG:  Basename 07/09/12 0819 07/08/12 2131 07/08/12 1731 07/08/12 1221 07/08/12 0806 07/07/12 2158  GLUCAP 150* 237* 175* 127* 178* 211*   Coagulation:  Basename 07/09/12 0557 07/08/12 0424  LABPROT 16.1* 18.9*  INR 1.32 1.64*   Urinalysis:  Basename 07/07/12 0159  COLORURINE YELLOW  LABSPEC 1.015  PHURINE 7.0  GLUCOSEU NEGATIVE  HGBUR NEGATIVE  BILIRUBINUR NEGATIVE  KETONESUR NEGATIVE  PROTEINUR NEGATIVE  UROBILINOGEN 1.0  NITRITE NEGATIVE  LEUKOCYTESUR NEGATIVE    Studies/Results: Dg Ankle Left Port  07/09/2012  *RADIOLOGY REPORT*  Clinical Data: Follow-up  fracture  PORTABLE LEFT ANKLE - 2 VIEW  Comparison: 07/06/2012  Findings: Cast material is now present about the ankle.  Fracture of the lateral and medial malleoli extending to the ankle joint is stable. Alignment has changed and has worsened.  The ankle mortise is now disrupted.  There is medial migration of the tibial plafond with respect to the talar dome.  There is medial displacement of the superior fibular fragment with respect to the inferior fragment.  There is also displacement of the medial malleolar fracture.  IMPRESSION: There is now significant displacement of the fracture site and disruption of the ankle mortise compared with the original exam.   Original Report Authenticated By: Donavan Burnet, M.D.     Medications: Scheduled Meds:    . cholecalciferol  2,000 Units Oral QHS  . clopidogrel  75 mg Oral QAC breakfast  . lisinopril  20 mg Oral QHS   And  . hydrochlorothiazide  25 mg Oral QHS  . influenza  inactive virus vaccine  0.5 mL Intramuscular Tomorrow-1000  . insulin aspart  0-9 Units Subcutaneous TID WC  . insulin NPH  5-10 Units Subcutaneous QHS  . metoprolol tartrate  50 mg Oral BID  . pantoprazole  40 mg Oral q morning - 10a  . simvastatin  40 mg Oral QHS  . sodium chloride  3 mL Intravenous Q12H  . sodium chloride  3 mL Intravenous Q12H  Continuous Infusions:    . heparin 1,400 Units/hr (07/08/12 1656)  . DISCONTD: heparin 1,300 Units/hr (07/08/12 0559)   PRN Meds:.sodium chloride, acetaminophen, acetaminophen, albuterol, diphenhydrAMINE, diphenhydrAMINE, HYDROmorphone (DILAUDID) injection, metoCLOPramide, ondansetron (ZOFRAN) IV, ondansetron, sodium chloride  Assessment/Plan:  Principal Problem:  *Trimalleolar fracture Active Problems:  DIABETES, TYPE 2  PAF,   CAD (coronary artery disease), LAD DES 04/07/12  Ischemic cardiomyopathy, EF 40-45% cath 04/07/12   Ankle Fracture -Plan for OR as per ortho.  CAD/AFib -Stable. -Cardiology involved and  adjusting anticoagulation. -INR down to 1.3 after stopping coumadin. -Transitioned to a heparin drip that will be held prior to OR. -ORTHO: PLEASE LET us KNOW WHEN IT IS OK TO RESTART COUMADIN AFTER SURGERY. -Needs to remain on plavix given recent DES placement.  DM -Check CBGs. -SSI.  Time spent coordinating care: 25 minutes   LOS: 3 days   HERNANDEZ ACOSTA,ESTELA [facility] Pager: [phone] 07/09/2012, 12:05 PM

## 2012-07-09 NOTE — Progress Notes (Signed)
ANTICOAGULATION CONSULT NOTE -Follow Up Consult  Pharmacy Consult for Heparin (to resume 10/14 at 14:00) and Warfarin (to resume tonight 10/13) Indication: atrial fibrillation  Allergies  Allergen Reactions  . Levofloxacin Itching  . Morphine And Related Itching    Patient Measurements: Height: 5\' 3"  (160 cm) Weight: 247 lb (112.038 kg) IBW/kg (Calculated) : 52.4  Heparin Dosing Weight: 76kg  Vital Signs: Temp: 98.6 F (37 C) (10/13 2206) Temp src: Oral (10/13 1725) BP: 132/39 mmHg (10/13 2206) Pulse Rate: 74  (10/13 2206)  Labs:  Basename 07/09/12 0557 07/08/12 2214 07/08/12 1231 07/08/12 0424 07/07/12 0432 07/06/12 2350  HGB 10.7* 10.6* -- -- -- --  HCT 31.5* 31.1* -- -- 33.0* --  PLT 189 183 -- -- 222 --  APTT -- -- -- -- -- --  LABPROT 16.1* -- -- 18.9* 20.1* --  INR 1.32 -- -- 1.64* 1.78* --  HEPARINUNFRC 0.69 0.48 0.31 -- -- --  CREATININE -- -- -- -- 0.74 0.67  CKTOTAL -- -- -- -- -- --  CKMB -- -- -- -- -- --  TROPONINI -- -- -- -- -- --    Estimated Creatinine Clearance: 87.7 ml/min (by C-G formula based on Cr of 0.74).   Medical History: Past Medical History  Diagnosis Date  . Hypertension   . Hypercholesteremia   . Cancer   . Lung cancer   . Peripheral vascular disease   . Sleep apnea     " mild form ,does not use cpap "  . Diabetes mellitus     insulin dependent  . GERD (gastroesophageal reflux disease)   . H/O hiatal hernia   . Arthritis   . Non-STEMI (non-ST elevated myocardial infarction) 04/07/2012    See cath results below  . Presence of stent in LAD coronary artery 04/07/2012    Xience Expedition DES 2.75 mm x 18 mm (dilated to 3.0 mm)  . Paroxysmal atrial fibrillation 04/07/2012    On Warfarin  . CAD (coronary artery disease), native coronary artery 04/07/2012    Left Ventriculography:EF:  40-45%, Anterior hypokinesis; 99% LAD lesion @ SP1., 30-40% rPDA; otherwise angiographically normal    Medications:  Scheduled:     .   ceFAZolin (ANCEF) IV  1 g Intravenous Q8H  . cholecalciferol  2,000 Units Oral QHS  . clopidogrel  75 mg Oral QAC breakfast  . docusate sodium  100 mg Oral BID  . lisinopril  20 mg Oral QHS   And  . hydrochlorothiazide  25 mg Oral QHS  . HYDROmorphone PCA 0.3 mg/mL   Intravenous Q4H  . insulin NPH  5-10 Units Subcutaneous QHS  . metoprolol tartrate  50 mg Oral BID  . pantoprazole  40 mg Oral q morning - 10a  . simvastatin  40 mg Oral QHS  . sodium chloride  3 mL Intravenous Q12H  . sodium chloride  3 mL Intravenous Q12H  . thrombin  20,000 Units Topical Once  . DISCONTD: insulin aspart  0-9 Units Subcutaneous TID WC    Assessment:  Coumadin PTA, on hold for anticipated ankle surgery  Heparin to be held 6hrs prior to surgery, surgery anticipated today  No bleeding complications noted. No issues with infusion or IV site reported  Heparin level was previously therapeutic on 1400 units/hour (0.69)  Pt is also on Plavix - recent non-ST elevation MI (July 2013) and drug-eluting stent  IV heparin was stopped at 11:00am today 10/13 in anticipation of ortho surgery  Per ortho MD, heparin is  to resume at 14:00 tomorrow at original rate - will follow up with MD tomorrow to see if they desire a bolus when it resumes or not   Order to resume warfarin dosing tonight for hx of Afib. Home dose reported as 5mg  on SunTuTh and 7.5mg  on MWFSat - last dose on 07/06/12  Current INR is subtherapeutic as expected  Will resume at with larger-than-usual home warfarin dose tonight.  Goal of Therapy:  Heparin level 0.3-0.7 units/ml Monitor platelets by anticoagulation protocol: Yes   Plan:   Warfarin 10mg  PO x1 tonight  Resume Heparin infusion @ 1400 units/hr TOMORROW at 14:00 (per MD order)  Daily PT/INR  Clarify with MD in am if heparin bolus is desired  Heparin level at 20:00 tomorrow 10/14 (6 hours after heparin is resumed)  Darrol Angel, PharmD Pager: [phone] 07/09/2012 10:20  PM

## 2012-07-09 NOTE — Brief Op Note (Signed)
07/06/2012 - 07/09/2012  8:40 PM  PATIENT:  [name]  61 y.o. female  PRE-OPERATIVE DIAGNOSIS:  trimalleolar fracture left ankle  POST-OPERATIVE DIAGNOSIS:  trimalleolar fracture left ankle  PROCEDURE:  Procedure(s): OPEN REDUCTION INTERNAL FIXATION (ORIF) ANKLE FRACTURE m and l malleoli SURGEON:  Surgeon(s): Cammy Copa, MD  ASSISTANT:   ANESTHESIA:   general  EBL: 25 ml    Total I/O In: -  Out: 600 [Urine:500; Blood:100]  BLOOD ADMINISTERED: none  DRAINS:  LOCAL MEDICATIONS USED:  none  SPECIMEN:  No Specimen  COUNTS:  YES  TOURNIQUET:  * Missing tourniquet times found for documented tourniquets in log:  [id number] *  DICTATION: .Other Dictation: Dictation Number [id number]   PLAN OF CARE: Admit to inpatient   PATIENT DISPOSITION:  PACU - hemodynamically stable

## 2012-07-09 NOTE — Progress Notes (Signed)
ANTICOAGULATION CONSULT NOTE -Follow Up Consult  Pharmacy Consult for Heparin Indication: atrial fibrillation  Allergies  Allergen Reactions  . Levofloxacin Itching  . Morphine And Related Itching    Patient Measurements: Height: 5\' 3"  (160 cm) Weight: 247 lb (112.038 kg) IBW/kg (Calculated) : 52.4  Heparin Dosing Weight: 76kg  Vital Signs: Temp: 98.9 F (37.2 C) (10/13 0620) Temp src: Oral (10/13 0620) BP: 148/70 mmHg (10/13 0620) Pulse Rate: 75  (10/13 0620)  Labs:  Basename 07/09/12 0557 07/08/12 2214 07/08/12 1231 07/08/12 0424 07/07/12 0432 07/06/12 2350  HGB 10.7* 10.6* -- -- -- --  HCT 31.5* 31.1* -- -- 33.0* --  PLT 189 183 -- -- 222 --  APTT -- -- -- -- -- --  LABPROT 16.1* -- -- 18.9* 20.1* --  INR 1.32 -- -- 1.64* 1.78* --  HEPARINUNFRC 0.69 0.48 0.31 -- -- --  CREATININE -- -- -- -- 0.74 0.67  CKTOTAL -- -- -- -- -- --  CKMB -- -- -- -- -- --  TROPONINI -- -- -- -- -- --    Estimated Creatinine Clearance: 87.7 ml/min (by C-G formula based on Cr of 0.74).   Medical History: Past Medical History  Diagnosis Date  . Hypertension   . Hypercholesteremia   . Cancer   . Lung cancer   . Peripheral vascular disease   . Sleep apnea     " mild form ,does not use cpap "  . Diabetes mellitus     insulin dependent  . GERD (gastroesophageal reflux disease)   . H/O hiatal hernia   . Arthritis   . Non-STEMI (non-ST elevated myocardial infarction) 04/07/2012    See cath results below  . Presence of stent in LAD coronary artery 04/07/2012    Xience Expedition DES 2.75 mm x 18 mm (dilated to 3.0 mm)  . Paroxysmal atrial fibrillation 04/07/2012    On Warfarin  . CAD (coronary artery disease), native coronary artery 04/07/2012    Left Ventriculography:EF:  40-45%, Anterior hypokinesis; 99% LAD lesion @ SP1., 30-40% rPDA; otherwise angiographically normal    Medications:  Scheduled:     . cholecalciferol  2,000 Units Oral QHS  . clopidogrel  75 mg Oral QAC  breakfast  . lisinopril  20 mg Oral QHS   And  . hydrochlorothiazide  25 mg Oral QHS  . influenza  inactive virus vaccine  0.5 mL Intramuscular Tomorrow-1000  . insulin aspart  0-9 Units Subcutaneous TID WC  . insulin NPH  5-10 Units Subcutaneous QHS  . metoprolol tartrate  50 mg Oral BID  . pantoprazole  40 mg Oral q morning - 10a  . simvastatin  40 mg Oral QHS  . sodium chloride  3 mL Intravenous Q12H  . sodium chloride  3 mL Intravenous Q12H    Assessment:  Coumadin PTA, on hold for anticipated ankle surgery  Heparin to be held 6hrs prior to surgery, surgery anticipated today  No bleeding complications noted. No issues with infusion or IV site reported  Heparin level therapeutic on 1400 units/hour (0.69)  Pt is also on Plavix - recent non-ST elevation MI (July 2013) and drug-eluting stent  Goal of Therapy:  Heparin level 0.3-0.7 units/ml Monitor platelets by anticoagulation protocol: Yes   Plan:   Continue Heparin infusion @ 1400 units/hr  F/u periop for anticoag orders  Gwen Her PharmD  [phone] 07/09/2012 7:20 AM

## 2012-07-09 NOTE — Preoperative (Signed)
Beta Blockers   Reason not to administer Beta Blockers:Metoprolol 50mg  taken at 1015 07-09-12

## 2012-07-09 NOTE — Progress Notes (Signed)
Pt for surgery R/b discussed with patient Risks - infxn - nonunion malunion oa Could also infect tka - but the ankle is unstable on xray today Cannot control with cast - outcome poor with non op mngmt Bleeding also a risk All ? Answered will proceed

## 2012-07-09 NOTE — Anesthesia Postprocedure Evaluation (Signed)
  Anesthesia Post-op Note  Patient: [name]  Procedure(s) Performed: Procedure(s) (LRB): OPEN REDUCTION INTERNAL FIXATION (ORIF) ANKLE FRACTURE (Left)  Patient Location: PACU  Anesthesia Type: General  Level of Consciousness: awake and alert   Airway and Oxygen Therapy: Patient Spontanous Breathing  Post-op Pain: mild  Post-op Assessment: Post-op Vital signs reviewed, Patient's Cardiovascular Status Stable, Respiratory Function Stable, Patent Airway and No signs of Nausea or vomiting  Post-op Vital Signs: stable  Complications: No apparent anesthesia complications

## 2012-07-09 NOTE — Transfer of Care (Signed)
Immediate Anesthesia Transfer of Care Note  Patient: [name]  Procedure(s) Performed: Procedure(s) (LRB) with comments: OPEN REDUCTION INTERNAL FIXATION (ORIF) ANKLE FRACTURE (Left) - open reduction internal fixation trimalleolar ankle fracture medial malleolous fixation  Patient Location: PACU  Anesthesia Type: General  Level of Consciousness: sedated  Airway & Oxygen Therapy: Patient Spontanous Breathing and Patient connected to face mask  Post-op Assessment: Report given to PACU RN and Post -op Vital signs reviewed and stable  Post vital signs: Reviewed and stable  Complications: No apparent anesthesia complications

## 2012-07-09 NOTE — Progress Notes (Signed)
Dr. August Saucer called unit earlier this am to inform nurse of plans to go to surgery at 1700 today. See order for informed consent. Dr. August Saucer also ordered for RN to stop heparin gtt at 1100 per pharmacy recommendation. Heparin stopped as ordered at 1100 and pharmacy made aware via phone.

## 2012-07-09 NOTE — Progress Notes (Signed)
ANTICOAGULATION CONSULT NOTE -Follow Up Consult  Pharmacy Consult for Heparin Indication: atrial fibrillation  Allergies  Allergen Reactions  . Levofloxacin Itching  . Morphine And Related Itching    Patient Measurements: Height: 5\' 3"  (160 cm) Weight: 247 lb (112.038 kg) IBW/kg (Calculated) : 52.4  Heparin Dosing Weight: 76kg  Vital Signs: Temp: 99.8 F (37.7 C) (10/12 2100) Temp src: Oral (10/12 2100) BP: 140/70 mmHg (10/12 2100) Pulse Rate: 78  (10/12 2100)  Labs:  Basename 07/08/12 2214 07/08/12 1231 07/08/12 0424 07/07/12 0432 07/06/12 2350  HGB 10.6* -- -- 11.5* --  HCT 31.1* -- -- 33.0* 34.8*  PLT 183 -- -- 222 228  APTT -- -- -- -- --  LABPROT -- -- 18.9* 20.1* 19.3*  INR -- -- 1.64* 1.78* 1.69*  HEPARINUNFRC 0.48 0.31 0.13* -- --  CREATININE -- -- -- 0.74 0.67  CKTOTAL -- -- -- -- --  CKMB -- -- -- -- --  TROPONINI -- -- -- -- --    Estimated Creatinine Clearance: 87.7 ml/min (by C-G formula based on Cr of 0.74).   Medical History: Past Medical History  Diagnosis Date  . Hypertension   . Hypercholesteremia   . Cancer   . Lung cancer   . Peripheral vascular disease   . Sleep apnea     " mild form ,does not use cpap "  . Diabetes mellitus     insulin dependent  . GERD (gastroesophageal reflux disease)   . H/O hiatal hernia   . Arthritis   . Non-STEMI (non-ST elevated myocardial infarction) 04/07/2012    See cath results below  . Presence of stent in LAD coronary artery 04/07/2012    Xience Expedition DES 2.75 mm x 18 mm (dilated to 3.0 mm)  . Paroxysmal atrial fibrillation 04/07/2012    On Warfarin  . CAD (coronary artery disease), native coronary artery 04/07/2012    Left Ventriculography:EF:  40-45%, Anterior hypokinesis; 99% LAD lesion @ SP1., 30-40% rPDA; otherwise angiographically normal    Medications:  Scheduled:     . cholecalciferol  2,000 Units Oral QHS  . clopidogrel  75 mg Oral QAC breakfast  . lisinopril  20 mg Oral QHS   And    . hydrochlorothiazide  25 mg Oral QHS  . influenza  inactive virus vaccine  0.5 mL Intramuscular Tomorrow-1000  . insulin aspart  0-9 Units Subcutaneous TID WC  . insulin NPH  5-10 Units Subcutaneous QHS  . metoprolol tartrate  50 mg Oral BID  . pantoprazole  40 mg Oral q morning - 10a  . simvastatin  40 mg Oral QHS  . sodium chloride  3 mL Intravenous Q12H  . sodium chloride  3 mL Intravenous Q12H    Assessment:  Coumadin now on hold for possible ankle surgery  To hold 6hrs prior to surgery  No bleeding complications noted  Heparin level therapeutic (0.48)  PLTC = 183  Goal of Therapy:  Heparin level 0.3-0.7 units/ml Monitor platelets by anticoagulation protocol: Yes   Plan:   Continue Heparin infusion @ 1400 units/hr   F/U AM Heparin Level & CBC  Follow up date/time of surgery to determine when to hold heparin  Terrilee Files, PharmD 07/09/12 @ 01:05

## 2012-07-10 LAB — CBC
HCT: 29.2 % — ABNORMAL LOW (ref 36.0–46.0)
Hemoglobin: 10.1 g/dL — ABNORMAL LOW (ref 12.0–15.0)
MCH: 28.9 pg (ref 26.0–34.0)
MCV: 83.7 fL (ref 78.0–100.0)
RBC: 3.49 MIL/uL — ABNORMAL LOW (ref 3.87–5.11)

## 2012-07-10 LAB — GLUCOSE, CAPILLARY
Glucose-Capillary: 184 mg/dL — ABNORMAL HIGH (ref 70–99)
Glucose-Capillary: 180 mg/dL — ABNORMAL HIGH (ref 70–99)

## 2012-07-10 MED ORDER — SODIUM CHLORIDE 0.9 % IV BOLUS (SEPSIS)
250.0000 mL | Freq: Once | INTRAVENOUS | Status: AC
  Administered 2012-07-10: 250 mL via INTRAVENOUS

## 2012-07-10 MED ORDER — WARFARIN SODIUM 7.5 MG PO TABS
7.5000 mg | ORAL_TABLET | Freq: Once | ORAL | Status: AC
  Administered 2012-07-10: 7.5 mg via ORAL
  Filled 2012-07-10: qty 1

## 2012-07-10 NOTE — Progress Notes (Signed)
MD notified of pt's diastolic bp being in the 30s since surgery. Orders given. Will continue to monitor .Patsey Berthold

## 2012-07-10 NOTE — Op Note (Signed)
NAMEMISHIKA, [name] NO.:  [id number]  MEDICAL RECORD NO.:  [id number]  LOCATION:  1526                         FACILITY:  [facility]  PHYSICIAN:  Burnard Bunting, M.D.    DATE OF BIRTH:  Apr 25, 1951  DATE OF PROCEDURE: DATE OF DISCHARGE:                              OPERATIVE REPORT   PREOPERATIVE DIAGNOSIS:  Left ankle trimalleolar fracture.  POSTOPERATIVE DIAGNOSIS:  Left ankle trimalleolar fracture.  PROCEDURE:  Left ankle trimalleolar fracture, open reduction and internal fixation of medial and lateral malleoli.  SURGEON:  Burnard Bunting, MD  ASSISTANT:  None.  ANESTHESIA:  General endotracheal.  ESTIMATED BLOOD LOSS:  20 mL.  DRAINS:  None.  INDICATIONS:  [name] is a patient with left ankle fracture, presents now for operative management for explanation of risks and benefits.  Ankle fracture was unstable on subsequent radiographs.  Once her cardiologic issues were maximized and her INR returned to normal, she was posted for surgical intervention after being informed of the multiple risks and benefits.  COMPONENTS UTILIZED:  [employer] 5-hole lateral malleolar plate with two 4-0 cannulated screws placed on the medial malleolus, these were [employer] products.  PROCEDURE IN DETAIL:  The patient was brought to the operating room where general endotracheal anesthesia was achieved.  Preoperative antibiotics were administered.  Time-out was called.  Left neck was prescrubbed with alcohol and Betadine, prepped with DuraPrep and draped in a sterile manner.  [name] was used to cover the operative field. Ankle Esmarch was utilized for approximately 50 minutes.  Lateral incision was made.  Skin and subcutaneous tissue were sharply divided. The periosteum was elevated anterior-posterior off the malleolar fracture.  The fracture was reduced under direct visualization.  Good reduction was confirmed in the AP and lateral planes under  fluoroscopy. Lag screw was placed.  Lag screw placement was somewhat difficult due to her large size.  A 5-hole plate was then applied.  Syndesmosis was stable.  Locking screws were placed distally and nonlocking screws were placed proximally again confirmed in the AP and lateral planes under fluoroscopy.  This incision was then thoroughly irrigated, packed with moist sponge.  Attention was then directed towards the medial side. Incision was made.  In general, this fracture was comminuted.  The periosteum was not incised, but the fracture reduction was very acceptable in the AP, lateral and mortise planes.  Two screws were then placed.  These were 46-mm cannulated 4-0 cancellous screws.  Reasonable purchase was obtained.  Placement was again confirmed in the AP and lateral planes under fluoroscopy.  Ankle was stable.  After fixation, thorough irrigation 2 liters in each incision was performed.  Because of her necessity, be placed back on heparin IV drip, postop, a thrombin spray was used in the incision, this was allowed to settle for 4-5 minutes.  Ankle Esmarch was then released.  Minimal bleeding points were present.  Incisions were then closed using interrupted inverted 2-0 Vicryl suture and followed by 3-0 nylon.  Well-padded posterior splint was applied.  The patient tolerated the procedure well without immediate complication.  Transferred to the recovery room in stable condition.     Burnard Bunting, M.D.  GSD/MEDQ  D:  07/09/2012  T:  07/10/2012  Job:  [id number]

## 2012-07-10 NOTE — Progress Notes (Signed)
Pt stable vss Toes left mobile perfused and sensate Plan for continuation of heparin - mobilize with PT nwb lle snf

## 2012-07-10 NOTE — Progress Notes (Signed)
Physical Therapy Treatment Patient Details Name: [name] MRN: [id number] DOB: Nov 14, 1950 Today's Date: 07/10/2012 Time: 1478-2956 PT Time Calculation (min): 10 min  PT Assessment / Plan / Recommendation Comments on Treatment Session  2nd tx to practice scoot transfer again and to assist pt back to bed. Will need SNF.     Follow Up Recommendations  Post acute inpatient     Does the patient have the potential to tolerate intense rehabilitation  No, Recommend SNF  Barriers to Discharge        Equipment Recommendations  Rolling walker with 5" wheels;Wheelchair cushion (measurements);Wheelchair (measurements)    Recommendations for Other Services    Frequency Min 3X/week   Plan Discharge plan remains appropriate    Precautions / Restrictions Precautions Precautions: Fall Restrictions Weight Bearing Restrictions: Yes LLE Weight Bearing: Non weight bearing   Pertinent Vitals/Pain L ankle/leg-unrated    Mobility  Bed Mobility Bed Mobility: Sit to Supine Sit to Supine: 1: +2 Total assist Sit to Supine: Patient Percentage: 60% Details for Bed Mobility Assistance: Increased time. VCs safety, technique, hand placement. Assist for L LE and trunk.  Transfers Transfers: Lateral/Scoot Transfers Lateral/Scoot Transfers: 1: +2 Total assist Lateral Transfers: Patient Percentage: 40% Details for Transfer Assistance: WC>bed. VCs safety, technique, hand placement. Increased time. Assist to weightshift and scoot. Transferred to R side.  Ambulation/Gait Ambulation/Gait Assistance: Not tested (comment)    Exercises     PT Diagnosis:    PT Problem List:   PT Treatment Interventions:     PT Goals Acute Rehab PT Goals Pt will go Supine/Side to Sit: with mod assist PT Goal: Supine/Side to Sit - Progress: Progressing toward goal Pt will go Sit to Supine/Side: with mod assist PT Goal: Sit to Supine/Side - Progress: Progressing toward goal Pt will Transfer Bed to Chair/Chair to  Bed: with mod assist PT Transfer Goal: Bed to Chair/Chair to Bed - Progress: Progressing toward goal  Visit Information  Last PT Received On: 07/10/12 Assistance Needed: +2    Subjective Data  Subjective: "I sat up for awhile" Patient Stated Goal: Less pain. Move better   Cognition  Overall Cognitive Status: Appears within functional limits for tasks assessed/performed Arousal/Alertness: Awake/alert Orientation Level: Appears intact for tasks assessed Behavior During Session: [facility] for tasks performed    Balance     End of Session PT - End of Session Activity Tolerance: Patient limited by pain;Patient limited by fatigue Patient left: in bed;with call bell/phone within reach   GP     Rebeca Alert [facility] 07/10/2012, 3:40 PM [phone]

## 2012-07-10 NOTE — Progress Notes (Signed)
Physical Therapy Treatment Patient Details Name: [name] MRN: [id number] DOB: Oct 18, 1950 Today's Date: 07/10/2012 Time: 8657-8469 PT Time Calculation (min): 39 min  PT Assessment / Plan / Recommendation Comments on Treatment Session  Pt underwent ORIF L ankle on 07/09/12. Remains NWB on L LE. Will need SNF for rehab.     Follow Up Recommendations  Post acute inpatient     Does the patient have the potential to tolerate intense rehabilitation  No, Recommend SNF  Barriers to Discharge        Equipment Recommendations  Rolling walker with 5" wheels;Wheelchair (measurements);Wheelchair cushion (measurements) (wide)    Recommendations for Other Services    Frequency Min 3X/week   Plan Discharge plan remains appropriate    Precautions / Restrictions Precautions Precautions: Fall Restrictions Weight Bearing Restrictions: Yes LLE Weight Bearing: Non weight bearing   Pertinent Vitals/Pain L LE at rest and with mobility-unrated    Mobility  Bed Mobility Bed Mobility: Supine to Sit Supine to Sit: 1: +2 Total assist Supine to Sit: Patient Percentage: 50% Details for Bed Mobility Assistance: Increased time. VCs safety, technique, hand placement. Assist for L LE off bed and trunk to upright. Utilized bedpad for scooting, positioning.  Transfers Transfers: Lateral/Scoot Transfers Lateral/Scoot Transfers: 1: +2 Total assist;With armrests removed;From elevated surface Lateral Transfers: Patient Percentage: 40% Details for Transfer Assistance: VCs safety, technique, hand placement. Increased time. Pt with difficulty with anterior weightshifting and using UEs to to clear bottom to allow for scoot. Assist needed to weightshift and scoot towards R side.   Ambulation/Gait Ambulation/Gait Assistance: Not tested (comment)    Exercises     PT Diagnosis:    PT Problem List:   PT Treatment Interventions:     PT Goals Acute Rehab PT Goals Pt will go Supine/Side to Sit: with mod  assist PT Goal: Supine/Side to Sit - Progress: Progressing toward goal Pt will Transfer Bed to Chair/Chair to Bed: with mod assist PT Transfer Goal: Bed to Chair/Chair to Bed - Progress: Progressing toward goal  Visit Information  Last PT Received On: 07/10/12 Assistance Needed: +2    Subjective Data  Subjective: "It's burning" Patient Stated Goal: Less pain.    Cognition  Overall Cognitive Status: Appears within functional limits for tasks assessed/performed Arousal/Alertness: Awake/alert Orientation Level: Appears intact for tasks assessed Behavior During Session: [facility] for tasks performed    Balance     End of Session PT - End of Session Activity Tolerance: Patient limited by pain;Patient limited by fatigue Patient left: in chair;with call bell/phone within reach   GP     Rebeca Alert [facility] 07/10/2012, 12:35 PM [phone]

## 2012-07-10 NOTE — Clinical Documentation Improvement (Signed)
BMI DOCUMENTATION CLARIFICATION QUERY  THIS DOCUMENT IS NOT A PERMANENT PART OF THE MEDICAL RECORD  TO RESPOND TO THE THIS QUERY, FOLLOW THE INSTRUCTIONS BELOW:  1. If needed, update documentation for the patient's encounter via the notes activity.  2. Access this query again and click edit on the In [employer].  3. After updating, or not, click F2 to complete all highlighted (required) fields concerning your review. Select "additional documentation in the medical record" OR "no additional documentation provided".  4. Click Sign note button.  5. The deficiency will fall out of your In Basket *Please let us know if you are not able to complete this workflow by phone or e-mail (listed below).         07/10/12  Dear Dr. Philip Aspen  [name]  In an effort to better capture your patient's severity of illness, reflect appropriate length of stay and utilization of resources, a review of the patient medical record has revealed the following indicators.    Based on your clinical judgment, please clarify and document in a progress note and/or discharge summary the clinical condition associated with the following supporting information:  In responding to this query please exercise your independent judgment.  The fact that a query is asked, does not imply that any particular answer is desired or expected. Based on your clinical judgment, please document in the progress notes and discharge summary if condition below provides greater specificity regarding the patient's height and weight: -  Morbid Obesity W/ BMI= 43.8   Other condition___________________  Cannot Clinically determine _____________  Risk Factors: Risk Factors: Diabetes Mellitus2, Hyperlipidemia, Sleep Apnea, HTN, GERD  Signs & Symptoms: Weight: 247lbs    Height:  66ft 3 inches    BMI = 43.8  Diagnostics: Lab: Glucose  on 07/06/12= 161   Treatment Medications: (megace, reglan) -Supplements: NS with KCL 20  mEq @ 27ml/ hr   -Medications: Reglan, prn ;    Protonix  every am,    Zocor    Reviewed: additional documentation in the medical record  Thank You,  Andy Gauss RN  Clinical Documentation Specialist:  Pager [phone] E-mail [email or web address]   Health Information Management [place]

## 2012-07-10 NOTE — Progress Notes (Signed)
[facility]             Progress Note   Subjective: To the OR today for repair of left ankle fracture.  Objective: Vital signs in last 24 hours: Temp:  [98.3 F (36.8 C)-99.1 F (37.3 C)] 99 F (37.2 C) (10/14 1000) Pulse Rate:  [73-99] 99  (10/14 1000) Resp:  [14-24] 24  (10/14 1237) BP: (113-149)/(37-87) 135/87 mmHg (10/14 1000) SpO2:  [95 %-100 %] 100 % (10/14 1237) Weight change:  Last BM Date: 07/07/12  Intake/Output from previous day: 10/13 0701 - 10/14 0700 In: 2713.8 [P.O.:120; I.V.:2343.8; IV Piggyback:250] Out: 1480 [Urine:1380; Blood:100] Total I/O In: 495 [P.O.:120; I.V.:375] Out: 0    Physical Exam: General: Alert, awake, oriented x3. HEENT: No bruits, no goiter. Heart: Regular rate and rhythm, without murmurs, rubs, gallops. Lungs: Clear to auscultation bilaterally. Abdomen: Soft, nontender, nondistended, positive bowel sounds. Extremities: No clubbing cyanosis or edema. Neuro: Grossly intact, nonfocal.    Lab Results: Basic Metabolic Panel: No results found for this basename: NA:2,K:2,CL:2,CO2:2,GLUCOSE:2,BUN:2,CREATININE:2,CALCIUM:2,MG:2,PHOS:2 in the last 72 hours CBC:  Basename 07/10/12 0345 07/09/12 0557  WBC 7.6 8.3  NEUTROABS -- --  HGB 10.1* 10.7*  HCT 29.2* 31.5*  MCV 83.7 83.3  PLT 183 189   CBG:  Basename 07/10/12 1156 07/10/12 0741 07/09/12 2228 07/09/12 2114 07/09/12 1552 07/09/12 1219  GLUCAP 181* 98 181* 175* 122* 135*   Coagulation:  Basename 07/10/12 0345 07/09/12 0557  LABPROT 14.8 16.1*  INR 1.18 1.32   Urinalysis: No results found for this basename: COLORURINE:2,APPERANCEUR:2,LABSPEC:2,PHURINE:2,GLUCOSEU:2,HGBUR:2,BILIRUBINUR:2,KETONESUR:2,PROTEINUR:2,UROBILINOGEN:2,NITRITE:2,LEUKOCYTESUR:2 in the last 72 hours  Studies/Results: Dg Ankle Complete Left  07/09/2012  *RADIOLOGY REPORT*  Clinical Data: ORIF of the left ankle fracture  LEFT ANKLE COMPLETE - 3+ VIEW  Comparison: 07/09/2012 at 6:09 a.m.   Findings: The distal fibular fracture has been reduced with a lateral fixation plate and associated screws.  Two screws reduce the medial malleolar fracture fragment.  The fracture fragments are in anatomic alignment.  The ankle mortise is normally spaced and aligned.  No evidence of an operative complication.  IMPRESSION: ORIF of a left ankle fracture as detailed   Original Report Authenticated By: Domenic Moras, M.D.    Dg Ankle Left Port  07/09/2012  *RADIOLOGY REPORT*  Clinical Data: Follow up post ORIF of a left ankle fracture  PORTABLE LEFT ANKLE - 2 VIEW  Comparison: 07/09/2012 at 1952 hours  Findings: Two screws reduce the medial malleolar fracture fragment into near anatomic alignment.  A distal fibular lateral fixation plate and an additional screw maintained the fibular fracture into anatomic alignment.  The ankle mortise is normally spaced and aligned.  The ankle is encased in a plaster posterior splint.  IMPRESSION: Status post ORIF of a left ankle fracture as detailed.  No evidence of an operative complication.   Original Report Authenticated By: Domenic Moras, M.D.    Dg Ankle Left Port  07/09/2012  *RADIOLOGY REPORT*  Clinical Data: Follow-up fracture  PORTABLE LEFT ANKLE - 2 VIEW  Comparison: 07/06/2012  Findings: Cast material is now present about the ankle.  Fracture of the lateral and medial malleoli extending to the ankle joint is stable. Alignment has changed and has worsened.  The ankle mortise is now disrupted.  There is medial migration of the tibial plafond with respect to the talar dome.  There is medial displacement of the superior fibular fragment with respect to the inferior fragment.  There is also displacement of the medial malleolar fracture.  IMPRESSION:  There is now significant displacement of the fracture site and disruption of the ankle mortise compared with the original exam.   Original Report Authenticated By: Donavan Burnet, M.D.    Dg C-arm 1-60 Min-no  Report  07/09/2012  CLINICAL DATA: fracture left ankle   C-ARM 1-60 MINUTES  Fluoroscopy was utilized by the requesting physician.  No radiographic  interpretation.      Medications: Scheduled Meds:    .  ceFAZolin (ANCEF) IV  1 g Intravenous Q8H  . cholecalciferol  2,000 Units Oral QHS  . clopidogrel  75 mg Oral QAC breakfast  . docusate sodium  100 mg Oral BID  . lisinopril  20 mg Oral QHS   And  . hydrochlorothiazide  25 mg Oral QHS  . HYDROmorphone PCA 0.3 mg/mL   Intravenous Q4H  . insulin NPH  5-10 Units Subcutaneous QHS  . metoprolol tartrate  50 mg Oral BID  . pantoprazole  40 mg Oral q morning - 10a  . simvastatin  40 mg Oral QHS  . sodium chloride  250 mL Intravenous Once  . sodium chloride  3 mL Intravenous Q12H  . sodium chloride  3 mL Intravenous Q12H  . thrombin  20,000 Units Topical Once  . warfarin  10 mg Oral Once  . warfarin  7.5 mg Oral ONCE-1800  . Warfarin - Pharmacist Dosing Inpatient   Does not apply q1800  . DISCONTD: insulin aspart  0-9 Units Subcutaneous TID WC   Continuous Infusions:    . 0.9 % NaCl with KCl 20 mEq / L 75 mL/hr at 07/10/12 1200  . heparin    . lactated ringers    . DISCONTD: heparin 1,400 Units/hr (07/08/12 1656)   PRN Meds:.acetaminophen, acetaminophen, albuterol, diphenhydrAMINE, diphenhydrAMINE, diphenhydrAMINE, HYDROmorphone (DILAUDID) injection, HYDROmorphone (DILAUDID) injection, methocarbamol (ROBAXIN) IV, methocarbamol, metoCLOPramide (REGLAN) injection, metoCLOPramide, naloxone, ondansetron (ZOFRAN) IV, ondansetron, oxyCODONE, sodium chloride, sodium chloride, DISCONTD: sodium chloride, DISCONTD: 0.9 % irrigation (POUR BTL), DISCONTD: diphenhydrAMINE DISCONTD:  HYDROmorphone (DILAUDID) injection, DISCONTD: metoCLOPramide, DISCONTD: ondansetron (ZOFRAN) IV, DISCONTD: ondansetron (ZOFRAN) IV, DISCONTD: ondansetron  Assessment/Plan:  Principal Problem:  *Trimalleolar fracture Active Problems:  DIABETES, TYPE 2  PAF,    CAD (coronary artery disease), LAD DES 04/07/12  Ischemic cardiomyopathy, EF 40-45% cath 04/07/12   Ankle Fracture -s/p repair.  CAD/AFib -Stable. -Back on heparin drip as a bridge. -Coumadin  restarted as well. -Pharmacy dosing heparin and coumadin. -Needs to remain on plavix given recent DES placement.  DM -Fair control. -Continue current regimen.  Disposition -Will alert SW that SNF is needed.  Time spent coordinating care: 25 minutes   LOS: 4 days   HERNANDEZ ACOSTA,ESTELA [facility] Pager: [phone] 07/10/2012, 2:37 PM

## 2012-07-10 NOTE — Care Management Note (Signed)
    Page 1 of 1   07/10/2012     2:50:19 PM   CARE MANAGEMENT NOTE 07/10/2012  Patient:  [name], [name]   Account Number:  [id number]  Date Initiated:  07/07/2012  Documentation initiated by:  Lorenda Ishihara  Subjective/Objective Assessment:   61 yo female admitted s/p fall with ankle fx. PTA lived at home with family.     Action/Plan:   SNF for rehab   Anticipated DC Date:  07/12/2012   Anticipated DC Plan:  SKILLED NURSING FACILITY  In-house referral  Clinical Social Worker      DC Planning Services  CM consult      Choice offered to / List presented to:             Status of service:  Completed, signed off Medicare Important Message given?   (If response is "NO", the following Medicare IM given date fields will be blank) Date Medicare IM given:   Date Additional Medicare IM given:    Discharge Disposition:  SKILLED NURSING FACILITY  Per UR Regulation:  Reviewed for med. necessity/level of care/duration of stay  If discussed at Long Length of Stay Meetings, dates discussed:    Comments:  07-09-12 Lorenda Ishihara RN CM To OR for ORIF of ankle

## 2012-07-10 NOTE — Progress Notes (Addendum)
ANTICOAGULATION CONSULT NOTE -Follow Up Consult  Pharmacy Consult for Heparin/Warfarin Indication: atrial fibrillation  Allergies  Allergen Reactions  . Levofloxacin Itching  . Morphine And Related Itching    Patient Measurements: Height: 5\' 3"  (160 cm) Weight: 247 lb (112.038 kg) IBW/kg (Calculated) : 52.4  Heparin Dosing Weight: 76kg  Vital Signs: Temp: 98.5 F (36.9 C) (10/14 0520) Temp src: Oral (10/14 0520) BP: 149/70 mmHg (10/14 0520) Pulse Rate: 82  (10/14 0520)  Labs:  Basename 07/10/12 0345 07/09/12 0557 07/08/12 2214 07/08/12 1231 07/08/12 0424  HGB 10.1* 10.7* -- -- --  HCT 29.2* 31.5* 31.1* -- --  PLT 183 189 183 -- --  APTT -- -- -- -- --  LABPROT 14.8 16.1* -- -- 18.9*  INR 1.18 1.32 -- -- 1.64*  HEPARINUNFRC -- 0.69 0.48 0.31 --  CREATININE -- -- -- -- --  CKTOTAL -- -- -- -- --  CKMB -- -- -- -- --  TROPONINI -- -- -- -- --    Estimated Creatinine Clearance: 87.7 ml/min (by C-G formula based on Cr of 0.74).   Medical History: Past Medical History  Diagnosis Date  . Hypertension   . Hypercholesteremia   . Cancer   . Lung cancer   . Peripheral vascular disease   . Sleep apnea     " mild form ,does not use cpap "  . Diabetes mellitus     insulin dependent  . GERD (gastroesophageal reflux disease)   . H/O hiatal hernia   . Arthritis   . Non-STEMI (non-ST elevated myocardial infarction) 04/07/2012    See cath results below  . Presence of stent in LAD coronary artery 04/07/2012    Xience Expedition DES 2.75 mm x 18 mm (dilated to 3.0 mm)  . Paroxysmal atrial fibrillation 04/07/2012    On Warfarin  . CAD (coronary artery disease), native coronary artery 04/07/2012    Left Ventriculography:EF:  40-45%, Anterior hypokinesis; 99% LAD lesion @ SP1., 30-40% rPDA; otherwise angiographically normal    Assessment:  Coumadin PTA- held for anticipated ankle surgery and patient was bridged with heparin.  Heparin held 6hrs prior to surgery on  10/13.  Heparin level was previously therapeutic on 1400 units/hour (0.69).  No bleeding complications noted. No issues with infusion or IV site reported  Pt is also on Plavix - recent non-ST elevation MI (July 2013) and drug-eluting stent.  Plavix not held for surgery.  Per ortho MD, heparin is to resume at 14:00 today at original rate.  AET: Sun Jul 09, 2012 2049.  Warfarin resumed 10/13 PM for hx of Afib. Home dose reported as 5mg  on SunTuTh and 7.5mg  on MWFSat - last dose on 07/06/12  Current INR is subtherapeutic as expected.  Goal of Therapy:  Heparin level 0.3-0.7 units/ml Monitor platelets by anticoagulation protocol: Yes   Plan:   Warfarin 7.5 mg PO x1 at 1800.  Resume Heparin infusion @ 1400 units/hr today at 14:00 (per MD order).  No bolus d/t recent surgery and Plavix on board.  Daily PT/INR  Heparin level at 20:00 (6 hours after heparin is resumed)  Clance Boll, PharmD, BCPS Pager: [phone] 07/10/2012 7:40 AM   Addendum:  Assessment:  HL=0.31 units/ml after 1400 units/hr x ~ 6 hours.  No problems reported per RN.  Plan:  Continue drip @ current rate.  Recheck HL in am.   Susanne Greenhouse R 07/11/2012.12:32 AM

## 2012-07-11 ENCOUNTER — Encounter (HOSPITAL_COMMUNITY): Payer: Self-pay | Admitting: Orthopedic Surgery

## 2012-07-11 DIAGNOSIS — E669 Obesity, unspecified: Secondary | ICD-10-CM

## 2012-07-11 LAB — CBC
MCV: 84.6 fL (ref 78.0–100.0)
Platelets: 191 10*3/uL (ref 150–400)
RBC: 3.25 MIL/uL — ABNORMAL LOW (ref 3.87–5.11)
WBC: 7.1 10*3/uL (ref 4.0–10.5)

## 2012-07-11 LAB — GLUCOSE, CAPILLARY: Glucose-Capillary: 184 mg/dL — ABNORMAL HIGH (ref 70–99)

## 2012-07-11 LAB — HEPARIN LEVEL (UNFRACTIONATED)
Heparin Unfractionated: 0.31 IU/mL (ref 0.30–0.70)
Heparin Unfractionated: 0.35 IU/mL (ref 0.30–0.70)

## 2012-07-11 MED ORDER — WARFARIN SODIUM 7.5 MG PO TABS
7.5000 mg | ORAL_TABLET | Freq: Once | ORAL | Status: AC
  Administered 2012-07-11: 7.5 mg via ORAL
  Filled 2012-07-11: qty 1

## 2012-07-11 MED ORDER — INSULIN ASPART 100 UNIT/ML ~~LOC~~ SOLN
4.0000 [IU] | Freq: Three times a day (TID) | SUBCUTANEOUS | Status: DC
  Administered 2012-07-11 – 2012-07-12 (×3): 4 [IU] via SUBCUTANEOUS

## 2012-07-11 MED ORDER — INSULIN ASPART 100 UNIT/ML ~~LOC~~ SOLN
0.0000 [IU] | Freq: Three times a day (TID) | SUBCUTANEOUS | Status: DC
  Administered 2012-07-11 – 2012-07-12 (×2): 8 [IU] via SUBCUTANEOUS
  Administered 2012-07-12: 3 [IU] via SUBCUTANEOUS

## 2012-07-11 NOTE — Progress Notes (Signed)
Physical Therapy Treatment Patient Details Name: [name] MRN: [id number] DOB: [birth year] Today's Date: 07/11/2012 Time: 9147-8295 PT Time Calculation (min): 30 min  PT Assessment / Plan / Recommendation Comments on Treatment Session  pt more uncomfortable today in WC, may have been positioning of leg.    Follow Up Recommendations  Post acute inpatient     Does the patient have the potential to tolerate intense rehabilitation  No, Recommend SNF  Barriers to Discharge        Equipment Recommendations  Rolling walker with 5" wheels;Wheelchair cushion (measurements);Wheelchair (measurements)    Recommendations for Other Services OT consult  Frequency Min 3X/week   Plan Discharge plan remains appropriate;Frequency remains appropriate    Precautions / Restrictions Precautions Precautions: Fall Restrictions LLE Weight Bearing: Non weight bearing   Pertinent Vitals/Pain 10/10 L ankle.    Mobility  Bed Mobility Rolling Right: 3: Mod assist;With rail Rolling Left: 3: Mod assist;With rail  Sit to Supine: 1: +2 Total assist Sit to Supine: Patient Percentage: 50%  Transfers Transfers: Lateral/Scoot Transfers Lateral/Scoot Transfers: 1: +2 Total assist Lateral Transfers: Patient Percentage: 30% Details for Transfer Assistance: use of bed pad to assist scooting onto bed from [facility]    Exercises     PT Diagnosis:    PT Problem List:   PT Treatment Interventions:     PT Goals Acute Rehab PT Goals Pt will go Supine/Side to Sit: with mod assist PT Goal: Supine/Side to Sit - Progress: Progressing toward goal Pt will go Sit to Supine/Side: with mod assist PT Goal: Sit to Supine/Side - Progress: Progressing toward goal Pt will Transfer Bed to Chair/Chair to Bed: with mod assist PT Transfer Goal: Bed to Chair/Chair to Bed - Progress: Progressing toward goal  Visit Information  Last PT Received On: 07/11/12 Assistance Needed: +2    Subjective Data  Subjective: my leg  is hurting really bad in this WC   Cognition  Overall Cognitive Status: Appears within functional limits for tasks assessed/performed Arousal/Alertness: Awake/alert Orientation Level: Appears intact for tasks assessed    Balance  Dynamic Sitting Balance Dynamic Sitting - Comments: Pt is able to weight shift to each side, requires support of L leg to abduct legs to use urinal while sitting on edge  End of Session PT - End of Session Activity Tolerance: Patient limited by pain Patient left: in bed;with call bell/phone within reach Nurse Communication: Mobility status   GP     Rada Hay 07/11/2012, 3:21 PM

## 2012-07-11 NOTE — Progress Notes (Addendum)
[facility]             Progress Note   Subjective: Complains of pain of her left knee and ankle.  Objective: Vital signs in last 24 hours: Temp:  [99.7 F (37.6 C)-99.9 F (37.7 C)] 99.8 F (37.7 C) (10/15 1400) Pulse Rate:  [76-90] 76  (10/15 1400) Resp:  [16-20] 19  (10/15 1635) BP: (106-152)/(43-72) 109/43 mmHg (10/15 1400) SpO2:  [96 %-100 %] 99 % (10/15 1400) FiO2 (%):  [51 %-52 %] 52 % (10/15 0413) Weight change:  Last BM Date: 07/07/12  Intake/Output from previous day: 10/14 0701 - 10/15 0700 In: 2701.6 [P.O.:840; I.V.:1811.6; IV Piggyback:50] Out: 1225 [Urine:1225] Total I/O In: 240 [P.O.:240] Out: 1600 [Urine:1600]   Physical Exam: General: Alert, awake, oriented x3. HEENT: No bruits, no goiter. Heart: Regular rate and rhythm, without murmurs, rubs, gallops. Lungs: Clear to auscultation bilaterally. Abdomen: Soft, nontender, nondistended, positive bowel sounds. Extremities: Left leg in dressing. Neuro: Grossly intact, nonfocal.    CBC:  Basename 07/11/12 0403 07/10/12 0345  WBC 7.1 7.6  NEUTROABS -- --  HGB 9.2* 10.1*  HCT 27.5* 29.2*  MCV 84.6 83.7  PLT 191 183   CBG:  Basename 07/11/12 1543 07/11/12 1148 07/11/12 0811 07/10/12 2212 07/10/12 1920 07/10/12 1156  GLUCAP 285* 303* 184* 180* 184* 181*   Coagulation:  Basename 07/11/12 0403 07/10/12 0345  LABPROT 16.9* 14.8  INR 1.41 1.18    Studies/Results: Dg Ankle Complete Left  07/09/2012  *RADIOLOGY REPORT*  Clinical Data: ORIF of the left ankle fracture  LEFT ANKLE COMPLETE - 3+ VIEW  Comparison: 07/09/2012 at 6:09 a.m.  Findings: The distal fibular fracture has been reduced with a lateral fixation plate and associated screws.  Two screws reduce the medial malleolar fracture fragment.  The fracture fragments are in anatomic alignment.  The ankle mortise is normally spaced and aligned.  No evidence of an operative complication.  IMPRESSION: ORIF of a left ankle fracture as  detailed   Original Report Authenticated By: Domenic Moras, M.D.    Dg Ankle Left Port  07/09/2012  *RADIOLOGY REPORT*  Clinical Data: Follow up post ORIF of a left ankle fracture  PORTABLE LEFT ANKLE - 2 VIEW  Comparison: 07/09/2012 at 1952 hours  Findings: Two screws reduce the medial malleolar fracture fragment into near anatomic alignment.  A distal fibular lateral fixation plate and an additional screw maintained the fibular fracture into anatomic alignment.  The ankle mortise is normally spaced and aligned.  The ankle is encased in a plaster posterior splint.  IMPRESSION: Status post ORIF of a left ankle fracture as detailed.  No evidence of an operative complication.   Original Report Authenticated By: Domenic Moras, M.D.    Dg C-arm 1-60 Min-no Report  07/09/2012  CLINICAL DATA: fracture left ankle   C-ARM 1-60 MINUTES  Fluoroscopy was utilized by the requesting physician.  No radiographic  interpretation.      Medications: Scheduled Meds:    . cholecalciferol  2,000 Units Oral QHS  . clopidogrel  75 mg Oral QAC breakfast  . docusate sodium  100 mg Oral BID  . lisinopril  20 mg Oral QHS   And  . hydrochlorothiazide  25 mg Oral QHS  . HYDROmorphone PCA 0.3 mg/mL   Intravenous Q4H  . insulin aspart  0-15 Units Subcutaneous TID WC  . insulin aspart  4 Units Subcutaneous TID WC  . insulin NPH  5-10 Units Subcutaneous QHS  . metoprolol tartrate  50 mg Oral BID  . pantoprazole  40 mg Oral q morning - 10a  . simvastatin  40 mg Oral QHS  . sodium chloride  3 mL Intravenous Q12H  . sodium chloride  3 mL Intravenous Q12H  . warfarin  7.5 mg Oral ONCE-1800  . Warfarin - Pharmacist Dosing Inpatient   Does not apply q1800   Continuous Infusions:    . 0.9 % NaCl with KCl 20 mEq / L 75 mL/hr at 07/10/12 1700  . heparin 1,400 Units/hr (07/11/12 1041)  . DISCONTD: lactated ringers 125 mL/hr at 07/11/12 0253   PRN Meds:.acetaminophen, acetaminophen, albuterol, diphenhydrAMINE,  diphenhydrAMINE, diphenhydrAMINE, HYDROmorphone (DILAUDID) injection, HYDROmorphone (DILAUDID) injection, methocarbamol (ROBAXIN) IV, methocarbamol, metoCLOPramide (REGLAN) injection, metoCLOPramide, naloxone, ondansetron (ZOFRAN) IV, ondansetron, oxyCODONE, sodium chloride, sodium chloride  Assessment/Plan:  Principal Problem:  *Trimalleolar fracture Active Problems:  DIABETES, TYPE 2  PAF,   CAD (coronary artery disease), LAD DES 04/07/12  Ischemic cardiomyopathy, EF 40-45% cath 04/07/12  Morbid obesity   Ankle Fracture -s/p repair.  CAD/AFib -Stable. -Back on heparin drip as a bridge. -Coumadin  restarted as well. -Pharmacy dosing heparin and coumadin. -Needs to remain on plavix given recent DES placement.  DM -Fair control. -One isolated CBG >300. -Continue current regimen. -May need to increase NPH if trend continues to be high.  Disposition -Will alert SW that SNF is needed. -Is medically ready for DC whenever SNF is available.  Time spent coordinating care: 25 minutes   LOS: 5 days   HERNANDEZ ACOSTA,ESTELA [facility] Pager: [phone] 07/11/2012, 6:50 PM

## 2012-07-11 NOTE — Progress Notes (Signed)
Pt stable vss Toe df pf ok Awaiting snf May be best to be off hep with therapeutic on coumadin

## 2012-07-11 NOTE — Progress Notes (Signed)
Physical Therapy Treatment Patient Details Name: [name] MRN: [id number] DOB: [birth year] Today's Date: 07/11/2012 Time:  -     PT Assessment / Plan / Recommendation Comments on Treatment Session  Pt. takes extra time but does try very hard. Pt. was able to scoot into chair. May consider sliding board although pt is short in stauture and may not be able to use LE to push from floor.plan SNF soon    Follow Up Recommendations  Post acute inpatient     Does the patient have the potential to tolerate intense rehabilitation  No, Recommend SNF  Barriers to Discharge        Equipment Recommendations  Rolling walker with 5" wheels;Wheelchair cushion (measurements);Wheelchair (measurements)    Recommendations for Other Services OT consult  Frequency Min 3X/week   Plan Discharge plan remains appropriate;Frequency remains appropriate    Precautions / Restrictions Precautions Precautions: Fall Restrictions LLE Weight Bearing: Non weight bearing   Pertinent Vitals/Pain 4/10 L ankle, has PCA    Mobility  Bed Mobility Rolling Right: 3: Mod assist;With rail Rolling Left: 3: Mod assist;With rail Supine to Sit: 1: +2 Total assist Supine to Sit: Patient Percentage: 40% Details for Bed Mobility Assistance: requires extra time and a lot of weight shifting to get to edge of bed Transfers Transfers: Lateral/Scoot Transfers Lateral/Scoot Transfers: 1: +2 Total assist Lateral Transfers: Patient Percentage: 40% Details for Transfer Assistance: lateral scoot transfe from bed to [facility]    Exercises     PT Diagnosis:    PT Problem List:   PT Treatment Interventions:     PT Goals Acute Rehab PT Goals Pt will go Supine/Side to Sit: with mod assist PT Goal: Supine/Side to Sit - Progress: Progressing toward goal Pt will Transfer Bed to Chair/Chair to Bed: with mod assist PT Transfer Goal: Bed to Chair/Chair to Bed - Progress: Progressing toward goal  Visit Information  Assistance  Needed: +2    Subjective Data  Subjective: I will try   Cognition  Overall Cognitive Status: Appears within functional limits for tasks assessed/performed Arousal/Alertness: Awake/alert Orientation Level: Appears intact for tasks assessed    Balance  Dynamic Sitting Balance Dynamic Sitting - Comments: Pt is able to weight shift to each side, requires support of L leg to abduct legs to use urinal while sitting on edge  End of Session PT - End of Session Activity Tolerance: Patient limited by fatigue;Patient tolerated treatment well Patient left: in chair;with call bell/phone within reach Nurse Communication: Mobility status   GP     Rada Hay 07/11/2012, 3:17 PM

## 2012-07-11 NOTE — Progress Notes (Signed)
ANTICOAGULATION CONSULT NOTE -Follow Up Consult  Pharmacy Consult for Heparin/Warfarin Indication: atrial fibrillation  Allergies  Allergen Reactions  . Levofloxacin Itching  . Morphine And Related Itching    Patient Measurements: Height: 5\' 3"  (160 cm) Weight: 247 lb (112.038 kg) IBW/kg (Calculated) : 52.4  Heparin Dosing Weight: 76kg  Vital Signs: Temp: 99.9 F (37.7 C) (10/15 0530) Temp src: Oral (10/15 0530) BP: 106/62 mmHg (10/15 0530) Pulse Rate: 83  (10/15 0100)  Labs:  Basename 07/11/12 0403 07/10/12 2253 07/10/12 0345 07/09/12 0557  HGB 9.2* -- 10.1* --  HCT 27.5* -- 29.2* 31.5*  PLT 191 -- 183 189  APTT -- -- -- --  LABPROT 16.9* -- 14.8 16.1*  INR 1.41 -- 1.18 1.32  HEPARINUNFRC 0.35 0.31 -- 0.69  CREATININE -- -- -- --  CKTOTAL -- -- -- --  CKMB -- -- -- --  TROPONINI -- -- -- --    Estimated Creatinine Clearance: 87.7 ml/min (by C-G formula based on Cr of 0.74).   Medical History: Past Medical History  Diagnosis Date  . Hypertension   . Hypercholesteremia   . Cancer   . Lung cancer   . Peripheral vascular disease   . Sleep apnea     " mild form ,does not use cpap "  . Diabetes mellitus     insulin dependent  . GERD (gastroesophageal reflux disease)   . H/O hiatal hernia   . Arthritis   . Non-STEMI (non-ST elevated myocardial infarction) 04/07/2012    See cath results below  . Presence of stent in LAD coronary artery 04/07/2012    Xience Expedition DES 2.75 mm x 18 mm (dilated to 3.0 mm)  . Paroxysmal atrial fibrillation 04/07/2012    On Warfarin  . CAD (coronary artery disease), native coronary artery 04/07/2012    Left Ventriculography:EF:  40-45%, Anterior hypokinesis; 99% LAD lesion @ SP1., 30-40% rPDA; otherwise angiographically normal    Assessment:  Coumadin PTA for h/o afib- held starting 10/11 for anticipated ankle surgery and patient was bridged with heparin.  Heparin held 6hrs prior to surgery on 10/13.  Pt is also on Plavix -  recent non-ST elevation MI (July 2013) and drug-eluting stent.  Plavix not held for surgery.  Per ortho MD, heparin was to resume at 14:00 on 10/14 at original rate (1400 units/hr).  AET: Sun Jul 09, 2012 2049.  Start of heparin drip was delayed when RN realized that pt only had one IV line and PCA was running through it.  IV Team placed another line and heparin was started at 15:37 yesterday.  First HL returned at goal.  HL this morning also therapeutic, confirming current rate of 1400 units/hr.  No bleeding complications noted. No issues with infusion or IV site reported.  Warfarin resumed 10/13 PM for hx of Afib. Home dose reported as 5mg  on SunTuTh and 7.5mg  on MWFSat.  Current INR is subtherapeutic as expected, but increasing.  Goal of Therapy:  Heparin level 0.3-0.7 units/ml Monitor platelets by anticoagulation protocol: Yes   Plan:   Repeat Warfarin 7.5 mg PO x1 at 1800.  Continue Heparin infusion @ 1400 units/hr (14 mL/hr).  Daily PT/INR and CBC.  Heparin level in AM.  Clance Boll, PharmD, BCPS Pager: [phone] 07/11/2012 7:21 AM

## 2012-07-11 NOTE — Clinical Social Work Psychosocial (Signed)
     Clinical Social Work Department BRIEF PSYCHOSOCIAL ASSESSMENT 07/11/2012  Patient:  [name], [name]     Account Number:  [id number]     Admit date:  07/06/2012  Clinical Social Worker:  Robin Searing  Date/Time:  07/11/2012 10:59 AM  Referred by:  RN  Date Referred:  07/11/2012 Referred for  SNF Placement   Other Referral:   Interview type:  Patient Other interview type:    PSYCHOSOCIAL DATA Living Status:  ALONE Admitted from facility:   Level of care:   Primary support name:  family Primary support relationship to patient:   Degree of support available:   minimal    CURRENT CONCERNS Current Concerns  Post-Acute Placement   Other Concerns:    SOCIAL WORK ASSESSMENT / PLAN Patient requesting SNF at d/c- she is familiar with SNF and prefers [facility] at d/c-   Assessment/plan status:  Other - See comment Other assessment/ plan:   Will complete FL2 and PASARR for SNF search and placement asap.   Information/referral to community resources:   SNF    PATIENTS/FAMILYS RESPONSE TO PLAN OF CARE: Patient agreeable to SNF search- will proceed and advise on offers

## 2012-07-11 NOTE — Evaluation (Signed)
Occupational Therapy Evaluation Patient Details Name: [name] MRN: [id number] DOB: 1951/06/14 Today's Date: 07/11/2012 Time: 4098-1191 OT Time Calculation (min): 18 min  OT Assessment / Plan / Recommendation Clinical Impression  This 61 year old female sustained a R trimalleolar fx in a fall and is NWB.  She will benefit from skilled OT to increase independence with adls with min A level goals in acute    OT Assessment  Patient needs continued OT Services    Follow Up Recommendations  Skilled nursing facility    Barriers to Discharge      Equipment Recommendations  Rolling walker with 5" wheels;Wheelchair cushion (measurements);Wheelchair (measurements)    Recommendations for Other Services    Frequency  Min 2X/week    Precautions / Restrictions Precautions Precautions: Fall Restrictions Weight Bearing Restrictions: Yes LLE Weight Bearing: Non weight bearing   Pertinent Vitals/Pain RLE not rated; repositioned in bed    ADL  Grooming: Simulated;Set up Where Assessed - Grooming: Supine, head of bed up Upper Body Bathing: Simulated;Set up Where Assessed - Upper Body Bathing: Supported sitting Lower Body Bathing: Simulated;Moderate assistance (with AE) Where Assessed - Lower Body Bathing: Rolling right and/or left Upper Body Dressing: Simulated;Set up Where Assessed - Upper Body Dressing: Supine, head of bed up Lower Body Dressing: Simulated;Moderate assistance (with AE) Where Assessed - Lower Body Dressing: Rolling right and/or left Toilet Transfer: Simulated;+2 Total assistance, pt 30% Toilet Transfer Method:  (lateral scoot chair to bed) Toileting - Clothing Manipulation and Hygiene: Performed;Maximal assistance Where Assessed - Toileting Clothing Manipulation and Hygiene: Rolling right and/or left Transfers/Ambulation Related to ADLs: cotx with PT.  Pt had been uncomfortable sitting up in chair.  ADL Comments: Rolled in bed for hygiene as pt had used female  urinal and had spillage earlier.  Pt has AE from when she had TKA.      OT Diagnosis: Generalized weakness  OT Problem List: Decreased strength;Decreased activity tolerance;Pain;Decreased knowledge of use of DME or AE OT Treatment Interventions: Self-care/ADL training;DME and/or AE instruction;Therapeutic activities;Patient/family education   OT Goals Acute Rehab OT Goals OT Goal Formulation: With patient Time For Goal Achievement: 07/25/12 Potential to Achieve Goals: Good ADL Goals Pt Will Perform Lower Body Bathing: with min assist;with adaptive equipment;Supine, rolling right and/or left ADL Goal: Lower Body Bathing - Progress: Goal set today Pt Will Perform Lower Body Dressing: with min assist;with adaptive equipment;Supine, rolling right and/or left ADL Goal: Lower Body Dressing - Progress: Goal set today Pt Will Transfer to Toilet: with 2+ total assist;Squat pivot transfer;3-in-1;Maintaining weight bearing status;with cueing (comment type and amount) (pt 50%) ADL Goal: Toilet Transfer - Progress: Goal set today Pt Will Perform Toileting - Hygiene: with mod assist;Leaning right and/or left on 3-in-1/toilet ADL Goal: Toileting - Hygiene - Progress: Goal set today  Visit Information  Last OT Received On: 07/11/12 Assistance Needed: +2    Subjective Data  Subjective: I did so much better sitting in the chair yesterday   Prior Functioning     Home Living Lives With: Son Bathroom Shower/Tub: Tub/shower unit Bathroom Toilet: Standard Home Adaptive Equipment: Bedside commode/3-in-1;Walker - rolling Prior Function Level of Independence: Independent Communication Communication: No difficulties         Vision/Perception     Cognition  Overall Cognitive Status: Appears within functional limits for tasks assessed/performed Arousal/Alertness: Awake/alert Orientation Level: Appears intact for tasks assessed    Extremity/Trunk Assessment Right Upper Extremity  Assessment RUE ROM/Strength/Tone: [facility] for tasks assessed Left Upper Extremity Assessment LUE  ROM/Strength/Tone: [facility] for tasks assessed     Mobility Bed Mobility Rolling Right: 3: Mod assist;With rail Rolling Left: 3: Mod assist;With rail  Transfers Details for Transfer Assistance: lateral scoot transfer:  total A x 2, pt 30%     Shoulder Instructions     Exercise     Balance    End of Session OT - End of Session Equipment Utilized During Treatment:  (used pad to scoot from w/c to bed) Activity Tolerance: Patient limited by pain Patient left: in bed;with call bell/phone within reach  GO     Lucetta Baehr 07/11/2012, 1:23 PM Marica Otter, OTR/L [phone] 07/11/2012

## 2012-07-11 NOTE — Clinical Social Work Placement (Addendum)
     Clinical Social Work Department CLINICAL SOCIAL WORK PLACEMENT NOTE 07/12/2012  Patient:  [name], [name]  Account Number:  [id number] Admit date:  07/06/2012  Clinical Social Worker:  Robin Searing  Date/time:  07/11/2012 11:03 AM  Clinical Social Work is seeking post-discharge placement for this patient at the following level of care:   SKILLED NURSING   (*CSW will update this form in Epic as items are completed)   07/11/2012  Patient/family provided with [facility] Department of Clinical Social Works list of facilities offering this level of care within the geographic area requested by the patient (or if unable, by the patients family).  07/11/2012  Patient/family informed of their freedom to choose among providers that offer the needed level of care, that participate in Medicare, Medicaid or managed care program needed by the patient, have an available bed and are willing to accept the patient.  07/11/2012  Patient/family informed of [facility] ownership interest in [facility], as well as of the fact that they are under no obligation to receive care at this facility.  PASARR submitted to EDS on 07/11/2012 PASARR number received from EDS on 07/12/2012  FL2 transmitted to all facilities in geographic area requested by pt/family on  07/11/2012 FL2 transmitted to all facilities within larger geographic area on   Patient informed that his/her managed care company has contracts with or will negotiate with  certain facilities, including the following:     Patient/family informed of bed offers received:  07/12/2012 Patient chooses bed at [facility] Physician recommends and patient chooses bed at    Patient to be transferred to [facility] on  07/12/2012 Patient to be transferred to facility by [facility]  The following physician request were entered in Epic:   Additional Comments:

## 2012-07-12 DIAGNOSIS — E119 Type 2 diabetes mellitus without complications: Secondary | ICD-10-CM | POA: Diagnosis not present

## 2012-07-12 DIAGNOSIS — T8131XA Disruption of external operation (surgical) wound, not elsewhere classified, initial encounter: Secondary | ICD-10-CM | POA: Diagnosis not present

## 2012-07-12 DIAGNOSIS — I2510 Atherosclerotic heart disease of native coronary artery without angina pectoris: Secondary | ICD-10-CM | POA: Diagnosis not present

## 2012-07-12 DIAGNOSIS — K219 Gastro-esophageal reflux disease without esophagitis: Secondary | ICD-10-CM | POA: Diagnosis not present

## 2012-07-12 DIAGNOSIS — I4891 Unspecified atrial fibrillation: Secondary | ICD-10-CM | POA: Diagnosis not present

## 2012-07-12 DIAGNOSIS — E559 Vitamin D deficiency, unspecified: Secondary | ICD-10-CM | POA: Diagnosis not present

## 2012-07-12 DIAGNOSIS — I739 Peripheral vascular disease, unspecified: Secondary | ICD-10-CM | POA: Diagnosis not present

## 2012-07-12 DIAGNOSIS — I10 Essential (primary) hypertension: Secondary | ICD-10-CM | POA: Diagnosis not present

## 2012-07-12 DIAGNOSIS — S8990XA Unspecified injury of unspecified lower leg, initial encounter: Secondary | ICD-10-CM | POA: Diagnosis not present

## 2012-07-12 DIAGNOSIS — D649 Anemia, unspecified: Secondary | ICD-10-CM | POA: Diagnosis not present

## 2012-07-12 DIAGNOSIS — M533 Sacrococcygeal disorders, not elsewhere classified: Secondary | ICD-10-CM | POA: Diagnosis not present

## 2012-07-12 DIAGNOSIS — M6281 Muscle weakness (generalized): Secondary | ICD-10-CM | POA: Diagnosis not present

## 2012-07-12 DIAGNOSIS — Z7901 Long term (current) use of anticoagulants: Secondary | ICD-10-CM | POA: Diagnosis not present

## 2012-07-12 DIAGNOSIS — M25569 Pain in unspecified knee: Secondary | ICD-10-CM | POA: Diagnosis not present

## 2012-07-12 DIAGNOSIS — E039 Hypothyroidism, unspecified: Secondary | ICD-10-CM | POA: Diagnosis not present

## 2012-07-12 DIAGNOSIS — S82899A Other fracture of unspecified lower leg, initial encounter for closed fracture: Secondary | ICD-10-CM | POA: Diagnosis not present

## 2012-07-12 DIAGNOSIS — R262 Difficulty in walking, not elsewhere classified: Secondary | ICD-10-CM | POA: Diagnosis not present

## 2012-07-12 DIAGNOSIS — D62 Acute posthemorrhagic anemia: Secondary | ICD-10-CM | POA: Diagnosis not present

## 2012-07-12 DIAGNOSIS — D689 Coagulation defect, unspecified: Secondary | ICD-10-CM | POA: Diagnosis not present

## 2012-07-12 DIAGNOSIS — Z79899 Other long term (current) drug therapy: Secondary | ICD-10-CM | POA: Diagnosis not present

## 2012-07-12 DIAGNOSIS — IMO0001 Reserved for inherently not codable concepts without codable children: Secondary | ICD-10-CM | POA: Diagnosis not present

## 2012-07-12 DIAGNOSIS — S82853A Displaced trimalleolar fracture of unspecified lower leg, initial encounter for closed fracture: Secondary | ICD-10-CM | POA: Diagnosis not present

## 2012-07-12 DIAGNOSIS — E785 Hyperlipidemia, unspecified: Secondary | ICD-10-CM | POA: Diagnosis not present

## 2012-07-12 DIAGNOSIS — L02619 Cutaneous abscess of unspecified foot: Secondary | ICD-10-CM | POA: Diagnosis not present

## 2012-07-12 LAB — PROTIME-INR: Prothrombin Time: 17 seconds — ABNORMAL HIGH (ref 11.6–15.2)

## 2012-07-12 LAB — CBC
HCT: 26.6 % — ABNORMAL LOW (ref 36.0–46.0)
Hemoglobin: 9 g/dL — ABNORMAL LOW (ref 12.0–15.0)
MCH: 28.4 pg (ref 26.0–34.0)
MCHC: 33.8 g/dL (ref 30.0–36.0)
MCV: 83.9 fL (ref 78.0–100.0)

## 2012-07-12 LAB — GLUCOSE, CAPILLARY: Glucose-Capillary: 280 mg/dL — ABNORMAL HIGH (ref 70–99)

## 2012-07-12 MED ORDER — OXYCODONE HCL 5 MG PO TABS: 5.0000 mg | ORAL_TABLET | ORAL | Status: DC | PRN

## 2012-07-12 MED ORDER — METOPROLOL TARTRATE 25 MG PO TABS: 50.0000 mg | ORAL_TABLET | Freq: Two times a day (BID) | ORAL | Status: DC

## 2012-07-12 MED ORDER — DSS 100 MG PO CAPS: 100.0000 mg | ORAL_CAPSULE | Freq: Two times a day (BID) | ORAL | Status: DC

## 2012-07-12 MED ORDER — ENOXAPARIN SODIUM 120 MG/0.8ML ~~LOC~~ SOLN
110.0000 mg | Freq: Two times a day (BID) | SUBCUTANEOUS | Status: DC
  Filled 2012-07-12: qty 0.8

## 2012-07-12 MED ORDER — WARFARIN SODIUM 10 MG PO TABS
10.0000 mg | ORAL_TABLET | Freq: Once | ORAL | Status: DC
  Filled 2012-07-12: qty 1

## 2012-07-12 MED ORDER — ENOXAPARIN SODIUM 120 MG/0.8ML ~~LOC~~ SOLN
110.0000 mg | Freq: Once | SUBCUTANEOUS | Status: AC
  Administered 2012-07-12: 110 mg via SUBCUTANEOUS
  Filled 2012-07-12: qty 0.8

## 2012-07-12 MED ORDER — ACETAMINOPHEN 325 MG PO TABS: 650.0000 mg | ORAL_TABLET | Freq: Four times a day (QID) | ORAL | Status: DC | PRN

## 2012-07-12 MED ORDER — ENOXAPARIN SODIUM 100 MG/ML ~~LOC~~ SOLN: 110.0000 mg | Freq: Two times a day (BID) | SUBCUTANEOUS | Status: DC

## 2012-07-12 MED ORDER — METHOCARBAMOL 500 MG PO TABS: 500.0000 mg | ORAL_TABLET | Freq: Four times a day (QID) | ORAL | Status: DC | PRN

## 2012-07-12 NOTE — Progress Notes (Addendum)
ANTICOAGULATION CONSULT NOTE -Follow Up Consult  Pharmacy Consult for Heparin/Warfarin Indication: atrial fibrillation  Allergies  Allergen Reactions  . Levofloxacin Itching  . Morphine And Related Itching    Patient Measurements: Height: 5\' 3"  (160 cm) Weight: 247 lb (112.038 kg) IBW/kg (Calculated) : 52.4  Heparin Dosing Weight: 76kg  Vital Signs: Temp: 98.6 F (37 C) (10/16 1031) Temp src: Oral (10/16 1031) BP: 119/68 mmHg (10/16 1031) Pulse Rate: 73  (10/16 1031)  Labs:  Basename 07/12/12 0427 07/11/12 0403 07/10/12 2253 07/10/12 0345  HGB 9.0* 9.2* -- --  HCT 26.6* 27.5* -- 29.2*  PLT 194 191 -- 183  APTT -- -- -- --  LABPROT 17.0* 16.9* -- 14.8  INR 1.42 1.41 -- 1.18  HEPARINUNFRC 0.30 0.35 0.31 --  CREATININE -- -- -- --  CKTOTAL -- -- -- --  CKMB -- -- -- --  TROPONINI -- -- -- --    Estimated Creatinine Clearance: 87.7 ml/min (by C-G formula based on Cr of 0.74).   Medical History: Past Medical History  Diagnosis Date  . Hypertension   . Hypercholesteremia   . Cancer   . Lung cancer   . Peripheral vascular disease   . Sleep apnea     " mild form ,does not use cpap "  . Diabetes mellitus     insulin dependent  . GERD (gastroesophageal reflux disease)   . H/O hiatal hernia   . Arthritis   . Non-STEMI (non-ST elevated myocardial infarction) 04/07/2012    See cath results below  . Presence of stent in LAD coronary artery 04/07/2012    Xience Expedition DES 2.75 mm x 18 mm (dilated to 3.0 mm)  . Paroxysmal atrial fibrillation 04/07/2012    On Warfarin  . CAD (coronary artery disease), native coronary artery 04/07/2012    Left Ventriculography:EF:  40-45%, Anterior hypokinesis; 99% LAD lesion @ SP1., 30-40% rPDA; otherwise angiographically normal    Assessment:  Coumadin PTA for h/o afib- held starting 10/11 for anticipated ankle surgery and patient was bridged with heparin.  Heparin held 6hrs prior to surgery on 10/13.  Pt is also on Plavix -  recent non-ST elevation MI (July 2013) and drug-eluting stent.  Plavix not held for surgery.  Per ortho MD, heparin was to resume at 14:00 on 10/14 at original rate (1400 units/hr).  AET: Sun Jul 09, 2012 2049.  Start of heparin drip was delayed when RN realized that pt only had one IV line and PCA was running through it.  IV Team placed another line and heparin was started at 15:37 10/14.  HL this morning therapeutic, confirming current rate of 1400 units/hr.  No bleeding complications noted and platelets stable, hgb stable from yday. No issues with infusion or IV site reported.  Warfarin resumed 10/13 PM for hx of Afib. Home dose reported as 5mg  on SunTuTh and 7.5mg  on MWFSat.  Current INR is subtherapeutic as expected, but slowly trending up  Goal of Therapy:  Heparin level 0.3-0.7 units/ml Monitor platelets by anticoagulation protocol: Yes   Plan:   Warfarin 10 mg PO x1 at 1800.  Continue Heparin infusion @ 1400 units/hr (14 mL/hr). Awaiting INR >=2.   Daily PT/INR and CBC.  Heparin level in AM.  Juliette Alcide, PharmD, BCPS.   Pager: [phone] 07/12/2012 11:18 AM   Spoke with Dr. Irene Limbo, plan to transition from heparin gtt to SQ LMWH to facilitate d/c to SNF.    Plan: - D/C heparin gtt then one hour after  heparin off, give 1st dose of lovenox 110mg  SQ q12h -Follow CBC at minimum of q72h  Juliette Alcide, PharmD, BCPS.   Pager: [phone] 07/12/2012 1:14 PM

## 2012-07-12 NOTE — Progress Notes (Signed)
Pt stable - oob to chair Splint ok inr 1.4 snf per med F/u next thursday

## 2012-07-12 NOTE — Discharge Summary (Addendum)
Physician Discharge Summary  [name]:[id number] DOB: 1951-03-08 DOA: 07/06/2012  PCP: Georgianne Fick, MD Cardiologist: Dr. Royann Shivers   Orthopedics: Berdine Addison, MD  Admit date: 07/06/2012 Discharge date: 07/12/2012  Recommendations for Outpatient Follow-up:  1. Follow-up SNF for short-term rehabilitation after ankle fracture 2. Dressing, wound care, activity & mobility instructions per Dr. Diamantina Providence office. 3. Continue Lovenox therapeutic dosing until INR >2. Monitor CBC at least every 72 hours (last CBC 10/16 below). 4. Daily INR and adjustment of warfarin dosing for goal INR 2-3. INRs: 10/16 1.42, 10/15 1.41, 10/14 1.18, 10/13 1.32. 5. Continue to monitor blood sugars, adjust insulin as needed. Suggest sliding scale insulin at facility. 6. Will need follow-up with Dr. Erin Hearing office after discharge from SNF for PT/INR management.   Follow-up Information    Follow up with Cammy Copa, MD. In 9 days.   Contact information:   [facility] [address] [place] [address] [phone]       Follow up with [facility], MD. In 2 weeks.   Contact information:   [address] [place] [address] [phone]       Schedule an appointment as soon as possible for a visit with Thurmon Fair, MD. (after discharge from SNF to follow PT/INR)    Contact information:   [address] [place] [address] [phone]         Discharge Diagnoses:  1. Trimalleolar fracture of left ankle 2. Left knee pain 3. DM 4. Post-operative anemia/ABLA 5. CAD s/p stent DES 6. PAF  Discharge Condition: improved Disposition: SNF for short-term rehabilitation  Diet recommendation: diabetic diet  Filed Weights   07/06/12 2211 07/07/12 0345  Weight: 112.038 kg (247 lb) 112.038 kg (247 lb)    History of present illness:  61 year old woman fell getting into car and injured left knee and ankle. Found to  have trimalleolar fracture of left ankle.  Hospital Course:  [name] was admitted and seen by orthopedics. Initially non-operative management was suggested based on comorbities. Cardiology consultation was obtained and patient was cleared for surgery. Recommendations included: increased dose of metoprolol, continuation of Plavix, heparin bridge peri-operatively, and resumption of warfarin when able. 2-d echocardiogram as below. No post-operative complications were noted except for ABLA which did not require blood products.  1. Trimalleolar fracture of left ankle--s/p surgery 10/13 without complication. Discussed with Dr. August Saucer by telephone--clear for discharge from his standpoint, he concurs with plan for Lovenox at this point as bridge until INR therapeutic (as recommended by cardiology).  2. Left knee pain--per orthopedics.  3. DM--labile. Continue NPH, meal coverage as outpatient.  4. Post-operative anemia/ABLA--stable. Follow-up as outpatient.  5. CAD s/p stent DES--asymptomatic. Continue Plavix, metoprolol at 50mg  BID, statin and lisinopril.  6. PAF--on heparin bridge, warfarin per pharmacy. Discussed with pharmacy and Dr. August Saucer, ok to transition to Lovenox until INR >2. No ASA per cardiology. Follow CBC at minimum of q72h   Consultants:  Orthopedics     Cardiology   PT--SNF   OT--SNF  Procedures:  10/12 2-d echocardiogram--Left ventricle: The cavity size was normal. Wall thickness was increased in a pattern of mild LVH. Systolic function was normal. The estimated ejection fraction was in the range of 55% to 60%. Wall motion was normal; there were no regional wall motion abnormalities. There is minimal evidence of diastolic dysfunction by annulus DTI diastolic velocities.  10/13 Left ankle trimalleolar fracture, open reduction and internal fixation of medial and lateral malleoli.  Discharge Instructions  Discharge Orders    Future Appointments: Provider: Department: Dept  Phone: Center:   09/12/2012 9:45 AM Windell Hummingbird [facility] [phone] None   09/12/2012 10:30 AM [facility] [phone] [place]   09/13/2012 11:00 AM Si Gaul, MD [facility] [phone] None     Future Orders Please Complete By Expires   Diet Carb Modified      Discharge instructions      Comments:   Keep follow-up appointment with Dr. August Saucer. Activity and mobility instructions, wound care and dressing instructions, per Dr. August Saucer. Continue Lovenox until INR >2.       Medication List     As of 07/12/2012  1:32 PM    STOP taking these medications         naproxen sodium 220 MG tablet   Commonly known as: ANAPROX      TAKE these medications         acetaminophen 325 MG tablet   Commonly known as: TYLENOL   Take 2 tablets (650 mg total) by mouth every 6 (six) hours as needed (mild pain).      albuterol 108 (90 BASE) MCG/ACT inhaler   Commonly known as: PROVENTIL HFA;VENTOLIN HFA   Inhale 1-2 puffs into the lungs every 6 (six) hours as needed for wheezing.      cholecalciferol 1000 UNITS tablet   Commonly known as: VITAMIN D   Take 2,000 Units by mouth at bedtime.      clopidogrel 75 MG tablet   Commonly known as: PLAVIX   Take 75 mg by mouth every morning.      DSS 100 MG Caps   Take 100 mg by mouth 2 (two) times daily.      enoxaparin 100 MG/ML injection   Commonly known as: LOVENOX   Inject 1.1 mLs (110 mg total) into the skin every 12 (twelve) hours. First dose 10/17 early AM 0600. Follow CBC at least every 72 hours (last CBC was 10/16). Discontinue when INR >2.      insulin NPH 100 UNIT/ML injection   Commonly known as: HUMULIN N,NOVOLIN N   Inject 5-10 Units into the skin at bedtime. Sliding scale      insulin regular 100 units/mL injection   Commonly known as: NOVOLIN R,HUMULIN R   Inject 5 Units into the skin 3 (three) times daily before meals.      lisinopril-hydrochlorothiazide 20-25 MG per tablet   Commonly  known as: PRINZIDE,ZESTORETIC   Take 1 tablet by mouth at bedtime.      methocarbamol 500 MG tablet   Commonly known as: ROBAXIN   Take 1 tablet (500 mg total) by mouth every 6 (six) hours as needed (muscle spasms).      metoCLOPramide 10 MG tablet   Commonly known as: REGLAN   Take 10 mg by mouth 4 (four) times daily as needed. Nausea      metoprolol tartrate 25 MG tablet   Commonly known as: LOPRESSOR   Take 2 tablets (50 mg total) by mouth 2 (two) times daily.      oxyCODONE 5 MG immediate release tablet   Commonly known as: Oxy IR/ROXICODONE   Take 1-2 tablets (5-10 mg total) by mouth every 4 (four) hours as needed (moderate pain).      pantoprazole 40 MG tablet   Commonly known as: PROTONIX   Take 40 mg by mouth every morning.      simvastatin 40 MG tablet   Commonly known as: ZOCOR  Take 40 mg by mouth at bedtime.      warfarin 5 MG tablet   Commonly known as: COUMADIN   Take 5-7.5 mg by mouth daily. Takes 5 mg ( 1 tablet) on Sunday, Tuesday and Thursday and takes 7.5 mg (1.5 tablets) Monday, Wednesday, Friday and saturday        The results of significant diagnostics from this hospitalization (including imaging, microbiology, ancillary and laboratory) are listed below for reference.    Significant Diagnostic Studies: Dg Ankle Complete Left  07-14-2012  *RADIOLOGY REPORT*  Clinical Data: ORIF of the left ankle fracture  LEFT ANKLE COMPLETE - 3+ VIEW  Comparison: Jul 14, 2012 at 6:09 a.m.  Findings: The distal fibular fracture has been reduced with a lateral fixation plate and associated screws.  Two screws reduce the medial malleolar fracture fragment.  The fracture fragments are in anatomic alignment.  The ankle mortise is normally spaced and aligned.  No evidence of an operative complication.  IMPRESSION: ORIF of a left ankle fracture as detailed   Original Report Authenticated By: Domenic Moras, M.D.    Dg Ankle Complete Left  07/06/2012  *RADIOLOGY REPORT*   Clinical Data: Post fall, now with left ankle pain and swelling  LEFT ANKLE COMPLETE - 3+ VIEW  Comparison: None.  Findings: There is a minimally displaced trimalleolar fracture. The medial malleolar fracture appears to extend to the tibial talar articular surface.  The ankle mortise appears preserved.  The distal tibial fracture appears to extend to the distal tibiofibular joint.  There is expected adjacent soft tissue swelling and small ankle joint effusion.  Small plantar calcaneal spur.  Minimal enthesopathic change of Achilles tendon insertion site.  IMPRESSION: Minimally displaced trimalleolar fracture.   Original Report Authenticated By: Waynard Reeds, M.D.    Dg Knee Complete 4 Views Left  07/06/2012  *RADIOLOGY REPORT*  Clinical Data: Post fall, now with left-sided knee pain  LEFT KNEE - COMPLETE 4+ VIEW  Comparison: Left knee radiographs - 11/26/2011  Findings: Stable sequela of redo left total knee replacement. Minimal periprosthetic lucency about both the femoral and tibial shaft components is grossly unchanged.  Diffuse osteopenia without definite fracture.  No definite joint effusion.  Regional soft tissues are normal.  No radiopaque foreign body.  IMPRESSION: 1. No acute findings.  2.  Stable sequela of redo left total knee replacement.  Unchanged periprosthetic lucency, nonspecific but may be indicative of loosening.   Original Report Authenticated By: Waynard Reeds, M.D.    Dg Ankle Left Port  07-14-2012  *RADIOLOGY REPORT*  Clinical Data: Follow up post ORIF of a left ankle fracture  PORTABLE LEFT ANKLE - 2 VIEW  Comparison: 14-Jul-2012 at 1952 hours  Findings: Two screws reduce the medial malleolar fracture fragment into near anatomic alignment.  A distal fibular lateral fixation plate and an additional screw maintained the fibular fracture into anatomic alignment.  The ankle mortise is normally spaced and aligned.  The ankle is encased in a plaster posterior splint.  IMPRESSION: Status  post ORIF of a left ankle fracture as detailed.  No evidence of an operative complication.   Original Report Authenticated By: Domenic Moras, M.D.    Dg Ankle Left Port  07/14/12  *RADIOLOGY REPORT*  Clinical Data: Follow-up fracture  PORTABLE LEFT ANKLE - 2 VIEW  Comparison: 07/06/2012  Findings: Cast material is now present about the ankle.  Fracture of the lateral and medial malleoli extending to the ankle joint is stable. Alignment has  changed and has worsened.  The ankle mortise is now disrupted.  There is medial migration of the tibial plafond with respect to the talar dome.  There is medial displacement of the superior fibular fragment with respect to the inferior fragment.  There is also displacement of the medial malleolar fracture.  IMPRESSION: There is now significant displacement of the fracture site and disruption of the ankle mortise compared with the original exam.   Original Report Authenticated By: Donavan Burnet, M.D.    Microbiology: Recent Results (from the past 240 hour(s))  URINE CULTURE     Status: Normal   Collection Time   07/07/12  1:59 AM      Component Value Range Status Comment   Specimen Description URINE, CATHETERIZED   Final    Special Requests NONE   Final    Culture  Setup Time 07/07/2012 09:03   Final    Colony Count NO GROWTH   Final    Culture NO GROWTH   Final    Report Status 07/08/2012 FINAL   Final   SURGICAL PCR SCREEN     Status: Normal   Collection Time   07/09/12 12:46 PM      Component Value Range Status Comment   MRSA, PCR NEGATIVE  NEGATIVE Final    Staphylococcus aureus NEGATIVE  NEGATIVE Final      Labs: Basic Metabolic Panel:  Lab 07/07/12 1610 07/06/12 2350  NA 136 134*  K 3.4* 3.6  CL 98 96  CO2 30 29  GLUCOSE 182* 161*  BUN 11 12  CREATININE 0.74 0.67  CALCIUM 9.1 9.6  MG -- --  PHOS -- --   CBC:  Lab 07/12/12 0427 07/11/12 0403 07/10/12 0345 07/09/12 0557 07/08/12 2214 07/07/12 0432 07/06/12 2350  WBC 6.2 7.1 7.6  8.3 8.3 -- --  NEUTROABS -- -- -- -- -- 6.4 9.1*  HGB 9.0* 9.2* 10.1* 10.7* 10.6* -- --  HCT 26.6* 27.5* 29.2* 31.5* 31.1* -- --  MCV 83.9 84.6 83.7 83.3 83.6 -- --  PLT 194 191 183 189 183 -- --   CBG:  Lab 07/12/12 1150 07/12/12 0818 07/11/12 2238 07/11/12 1543 07/11/12 1148  GLUCAP 280* 156* 172* 285* 303*     Principal Problem:  *Trimalleolar fracture Active Problems:  DIABETES, TYPE 2  PAF,   CAD (coronary artery disease), LAD DES 04/07/12  Ischemic cardiomyopathy, EF 40-45% cath 04/07/12  Morbid obesity   Time coordinating discharge: 50 minutes  Signed:  Brendia Sacks, MD [facility] 07/12/2012, 1:32 PM

## 2012-07-12 NOTE — Progress Notes (Signed)
[facility] PROGRESS NOTE  RENETTA SUMAN ZOX:[id number] DOB: 1951/04/25 DOA: 07/06/2012 PCP: Georgianne Fick, MD Cardiologist: Dr. Royann Shivers  Orthopedics: Berdine Addison, MD  Assessment/Plan: 1. Trimalleolar fracture of left ankle--s/p surgery 10/13 without complication. Discussed with Dr. August Saucer by telephone--clear for discharge from his standpoint, he concurs with plan for Lovenox at this point as bridge until INR therapeutic. 2. Left knee pain--per orthopedics. 3. DM--labile. Continue NPH, meal coverage as outpatient. 4. Post-operative anemia/ABLA--stable. Follow-up as outpatient. 5. CAD s/p stent DES--asymptomatic. Continue Plavix, metoprolol at 50mg  BID, statin and lisinopril. 6. PAF--on heparin bridge, warfarin per pharmacy. Discussed with pharmacy and Dr. August Saucer, ok to transition to Lovenox until INR >2. No ASA per cardiology. Follow CBC at minimum of q72h  Code Status: full code Family Communication:  Disposition Plan: SNF today  Brendia Sacks, MD  [facility] Team 6 Pager [phone]. If 8PM-8AM, please contact night-coverage at [email or web address], password [facility] 07/12/2012, 12:35 PM  LOS: 6 days   Brief narrative: 61 year old woman fell getting into car and injured left knee and ankle. Found to have trimalleolar fracture of left ankle.  Consultants:  Orthopedics   Cardiology  PT--SNF  OT--SNF  Procedures:  10/12 2-d echocardiogram--Left ventricle: The cavity size was normal. Wall thickness was increased in a pattern of mild LVH. Systolic function was normal. The estimated ejection fraction was in the range of 55% to 60%. Wall motion was normal; there were no regional wall motion abnormalities. There is minimal evidence of diastolic dysfunction by annulus DTI diastolic velocities.  10/13 Left ankle trimalleolar fracture, open reduction and internal fixation of medial and lateral malleoli.  HPI/Subjective: Feels ok, no n/v/chest pain/SOB. No new issues. Some  ankle pain.  Objective: Filed Vitals:   07/12/12 0555 07/12/12 0811 07/12/12 0903 07/12/12 1031  BP: 131/75  155/83 119/68  Pulse: 68  79 73  Temp: 98.6 F (37 C)   98.6 F (37 C)  TempSrc: Oral   Oral  Resp: 20 24  24   Height:      Weight:      SpO2: 100% 96%  97%    Intake/Output Summary (Last 24 hours) at 07/12/12 1235 Last data filed at 07/12/12 1000  Gross per 24 hour  Intake   2441 ml  Output   3750 ml  Net  -1309 ml   Filed Weights   07/06/12 2211 07/07/12 0345  Weight: 112.038 kg (247 lb) 112.038 kg (247 lb)    Exam:  General:  Appears calm and comfortable Cardiovascular: RRR, no m/r/g. No LE edema. Respiratory: CTA bilaterally, no w/r/r. Normal respiratory effort. Musculoskeletal: LLE wrapped. Left knee appears unremarkable, no erythema, edema or pain with palpation. Psychiatric: grossly normal mood and affect, speech fluent and appropriate  Data Reviewed: Basic Metabolic Panel:  Lab 07/07/12 8295 07/06/12 2350  NA 136 134*  K 3.4* 3.6  CL 98 96  CO2 30 29  GLUCOSE 182* 161*  BUN 11 12  CREATININE 0.74 0.67  CALCIUM 9.1 9.6  MG -- --  PHOS -- --   CBC:  Lab 07/12/12 0427 07/11/12 0403 07/10/12 0345 07/09/12 0557 07/08/12 2214 07/07/12 0432 07/06/12 2350  WBC 6.2 7.1 7.6 8.3 8.3 -- --  NEUTROABS -- -- -- -- -- 6.4 9.1*  HGB 9.0* 9.2* 10.1* 10.7* 10.6* -- --  HCT 26.6* 27.5* 29.2* 31.5* 31.1* -- --  MCV 83.9 84.6 83.7 83.3 83.6 -- --  PLT 194 191 183 189 183 -- --   CBG:  Lab 07/12/12 1150 07/12/12  0818 07/11/12 2238 07/11/12 1543 07/11/12 1148  GLUCAP 280* 156* 172* 285* 303*    Recent Results (from the past 240 hour(s))  URINE CULTURE     Status: Normal   Collection Time   07/07/12  1:59 AM      Component Value Range Status Comment   Specimen Description URINE, CATHETERIZED   Final    Special Requests NONE   Final    Culture  Setup Time 07/07/2012 09:03   Final    Colony Count NO GROWTH   Final    Culture NO GROWTH   Final     Report Status 07/08/2012 FINAL   Final   SURGICAL PCR SCREEN     Status: Normal   Collection Time   07/09/12 12:46 PM      Component Value Range Status Comment   MRSA, PCR NEGATIVE  NEGATIVE Final    Staphylococcus aureus NEGATIVE  NEGATIVE Final      Studies: No results found.  Scheduled Meds:   . cholecalciferol  2,000 Units Oral QHS  . clopidogrel  75 mg Oral QAC breakfast  . docusate sodium  100 mg Oral BID  . lisinopril  20 mg Oral QHS   And  . hydrochlorothiazide  25 mg Oral QHS  . insulin aspart  0-15 Units Subcutaneous TID WC  . insulin aspart  4 Units Subcutaneous TID WC  . insulin NPH  5-10 Units Subcutaneous QHS  . metoprolol tartrate  50 mg Oral BID  . pantoprazole  40 mg Oral q morning - 10a  . simvastatin  40 mg Oral QHS  . sodium chloride  3 mL Intravenous Q12H  . sodium chloride  3 mL Intravenous Q12H  . warfarin  10 mg Oral ONCE-1800  . warfarin  7.5 mg Oral ONCE-1800  . Warfarin - Pharmacist Dosing Inpatient   Does not apply q1800  . DISCONTD: HYDROmorphone PCA 0.3 mg/mL   Intravenous Q4H   Continuous Infusions:   . heparin 1,400 Units/hr (07/12/12 0356)  . DISCONTD: lactated ringers 125 mL/hr at 07/11/12 0253    Principal Problem:  *Trimalleolar fracture Active Problems:  DIABETES, TYPE 2  PAF,   CAD (coronary artery disease), LAD DES 04/07/12  Ischemic cardiomyopathy, EF 40-45% cath 04/07/12  Morbid obesity     Brendia Sacks, MD  [facility] Team 6 Pager [phone]. If 8PM-8AM, please contact night-coverage at [email or web address], password [email or web address] 07/12/2012, 12:35 PM  LOS: 6 days

## 2012-07-14 DIAGNOSIS — M25569 Pain in unspecified knee: Secondary | ICD-10-CM | POA: Diagnosis not present

## 2012-07-14 DIAGNOSIS — I4891 Unspecified atrial fibrillation: Secondary | ICD-10-CM | POA: Diagnosis not present

## 2012-07-14 DIAGNOSIS — E119 Type 2 diabetes mellitus without complications: Secondary | ICD-10-CM | POA: Diagnosis not present

## 2012-07-14 DIAGNOSIS — D649 Anemia, unspecified: Secondary | ICD-10-CM | POA: Diagnosis not present

## 2012-07-14 DIAGNOSIS — IMO0001 Reserved for inherently not codable concepts without codable children: Secondary | ICD-10-CM | POA: Diagnosis not present

## 2012-07-14 DIAGNOSIS — I10 Essential (primary) hypertension: Secondary | ICD-10-CM | POA: Diagnosis not present

## 2012-07-19 DIAGNOSIS — S82853A Displaced trimalleolar fracture of unspecified lower leg, initial encounter for closed fracture: Secondary | ICD-10-CM | POA: Diagnosis not present

## 2012-07-19 DIAGNOSIS — D62 Acute posthemorrhagic anemia: Secondary | ICD-10-CM | POA: Diagnosis not present

## 2012-07-19 DIAGNOSIS — E119 Type 2 diabetes mellitus without complications: Secondary | ICD-10-CM | POA: Diagnosis not present

## 2012-07-24 DIAGNOSIS — S82853A Displaced trimalleolar fracture of unspecified lower leg, initial encounter for closed fracture: Secondary | ICD-10-CM | POA: Diagnosis not present

## 2012-07-26 NOTE — Progress Notes (Signed)
I agree.  Marykay Lex, M.D., M.S. [facility] [address] [place], [place]  [address]  [phone] Pager # [phone] 07/26/2012 1:38 PM

## 2012-07-28 DIAGNOSIS — S82853A Displaced trimalleolar fracture of unspecified lower leg, initial encounter for closed fracture: Secondary | ICD-10-CM | POA: Diagnosis not present

## 2012-07-28 DIAGNOSIS — E119 Type 2 diabetes mellitus without complications: Secondary | ICD-10-CM | POA: Diagnosis not present

## 2012-08-03 DIAGNOSIS — E119 Type 2 diabetes mellitus without complications: Secondary | ICD-10-CM | POA: Diagnosis not present

## 2012-08-03 DIAGNOSIS — S82853A Displaced trimalleolar fracture of unspecified lower leg, initial encounter for closed fracture: Secondary | ICD-10-CM | POA: Diagnosis not present

## 2012-08-03 DIAGNOSIS — T8131XA Disruption of external operation (surgical) wound, not elsewhere classified, initial encounter: Secondary | ICD-10-CM | POA: Diagnosis not present

## 2012-08-08 DIAGNOSIS — I2510 Atherosclerotic heart disease of native coronary artery without angina pectoris: Secondary | ICD-10-CM | POA: Diagnosis not present

## 2012-08-08 DIAGNOSIS — I10 Essential (primary) hypertension: Secondary | ICD-10-CM | POA: Diagnosis not present

## 2012-08-09 DIAGNOSIS — S82853A Displaced trimalleolar fracture of unspecified lower leg, initial encounter for closed fracture: Secondary | ICD-10-CM | POA: Diagnosis not present

## 2012-08-09 DIAGNOSIS — I2510 Atherosclerotic heart disease of native coronary artery without angina pectoris: Secondary | ICD-10-CM | POA: Diagnosis not present

## 2012-08-09 DIAGNOSIS — E119 Type 2 diabetes mellitus without complications: Secondary | ICD-10-CM | POA: Diagnosis not present

## 2012-08-09 DIAGNOSIS — I4891 Unspecified atrial fibrillation: Secondary | ICD-10-CM | POA: Diagnosis not present

## 2012-08-11 DIAGNOSIS — E119 Type 2 diabetes mellitus without complications: Secondary | ICD-10-CM | POA: Diagnosis not present

## 2012-08-11 DIAGNOSIS — IMO0001 Reserved for inherently not codable concepts without codable children: Secondary | ICD-10-CM | POA: Diagnosis not present

## 2012-08-11 DIAGNOSIS — I4891 Unspecified atrial fibrillation: Secondary | ICD-10-CM | POA: Diagnosis not present

## 2012-08-11 DIAGNOSIS — M25569 Pain in unspecified knee: Secondary | ICD-10-CM | POA: Diagnosis not present

## 2012-08-15 DIAGNOSIS — L02619 Cutaneous abscess of unspecified foot: Secondary | ICD-10-CM | POA: Diagnosis not present

## 2012-08-15 DIAGNOSIS — Z7901 Long term (current) use of anticoagulants: Secondary | ICD-10-CM | POA: Diagnosis not present

## 2012-08-15 DIAGNOSIS — I739 Peripheral vascular disease, unspecified: Secondary | ICD-10-CM | POA: Diagnosis not present

## 2012-08-15 DIAGNOSIS — L03119 Cellulitis of unspecified part of limb: Secondary | ICD-10-CM | POA: Diagnosis not present

## 2012-08-15 DIAGNOSIS — T8131XA Disruption of external operation (surgical) wound, not elsewhere classified, initial encounter: Secondary | ICD-10-CM | POA: Diagnosis not present

## 2012-08-18 DIAGNOSIS — E119 Type 2 diabetes mellitus without complications: Secondary | ICD-10-CM | POA: Diagnosis not present

## 2012-08-18 DIAGNOSIS — L02619 Cutaneous abscess of unspecified foot: Secondary | ICD-10-CM | POA: Diagnosis not present

## 2012-08-18 DIAGNOSIS — I739 Peripheral vascular disease, unspecified: Secondary | ICD-10-CM | POA: Diagnosis not present

## 2012-08-18 DIAGNOSIS — T8131XA Disruption of external operation (surgical) wound, not elsewhere classified, initial encounter: Secondary | ICD-10-CM | POA: Diagnosis not present

## 2012-08-18 DIAGNOSIS — L03119 Cellulitis of unspecified part of limb: Secondary | ICD-10-CM | POA: Diagnosis not present

## 2012-08-21 DIAGNOSIS — E119 Type 2 diabetes mellitus without complications: Secondary | ICD-10-CM | POA: Diagnosis not present

## 2012-08-21 DIAGNOSIS — L02619 Cutaneous abscess of unspecified foot: Secondary | ICD-10-CM | POA: Diagnosis not present

## 2012-08-27 DIAGNOSIS — E119 Type 2 diabetes mellitus without complications: Secondary | ICD-10-CM | POA: Diagnosis not present

## 2012-08-27 DIAGNOSIS — R262 Difficulty in walking, not elsewhere classified: Secondary | ICD-10-CM | POA: Diagnosis not present

## 2012-08-27 DIAGNOSIS — I2510 Atherosclerotic heart disease of native coronary artery without angina pectoris: Secondary | ICD-10-CM | POA: Diagnosis not present

## 2012-08-27 DIAGNOSIS — I10 Essential (primary) hypertension: Secondary | ICD-10-CM | POA: Diagnosis not present

## 2012-08-27 DIAGNOSIS — IMO0001 Reserved for inherently not codable concepts without codable children: Secondary | ICD-10-CM | POA: Diagnosis not present

## 2012-08-27 DIAGNOSIS — K219 Gastro-esophageal reflux disease without esophagitis: Secondary | ICD-10-CM | POA: Diagnosis not present

## 2012-08-27 DIAGNOSIS — D649 Anemia, unspecified: Secondary | ICD-10-CM | POA: Diagnosis not present

## 2012-08-27 DIAGNOSIS — I4891 Unspecified atrial fibrillation: Secondary | ICD-10-CM | POA: Diagnosis not present

## 2012-08-27 DIAGNOSIS — M6281 Muscle weakness (generalized): Secondary | ICD-10-CM | POA: Diagnosis not present

## 2012-08-27 DIAGNOSIS — T8131XA Disruption of external operation (surgical) wound, not elsewhere classified, initial encounter: Secondary | ICD-10-CM | POA: Diagnosis not present

## 2012-08-27 DIAGNOSIS — E785 Hyperlipidemia, unspecified: Secondary | ICD-10-CM | POA: Diagnosis not present

## 2012-08-28 DIAGNOSIS — I4891 Unspecified atrial fibrillation: Secondary | ICD-10-CM | POA: Diagnosis not present

## 2012-08-28 DIAGNOSIS — I2510 Atherosclerotic heart disease of native coronary artery without angina pectoris: Secondary | ICD-10-CM | POA: Diagnosis not present

## 2012-08-28 DIAGNOSIS — T8131XA Disruption of external operation (surgical) wound, not elsewhere classified, initial encounter: Secondary | ICD-10-CM | POA: Diagnosis not present

## 2012-08-28 DIAGNOSIS — E119 Type 2 diabetes mellitus without complications: Secondary | ICD-10-CM | POA: Diagnosis not present

## 2012-09-04 DIAGNOSIS — M533 Sacrococcygeal disorders, not elsewhere classified: Secondary | ICD-10-CM | POA: Diagnosis not present

## 2012-09-08 DIAGNOSIS — R269 Unspecified abnormalities of gait and mobility: Secondary | ICD-10-CM | POA: Diagnosis not present

## 2012-09-08 DIAGNOSIS — IMO0001 Reserved for inherently not codable concepts without codable children: Secondary | ICD-10-CM | POA: Diagnosis not present

## 2012-09-08 DIAGNOSIS — E119 Type 2 diabetes mellitus without complications: Secondary | ICD-10-CM | POA: Diagnosis not present

## 2012-09-08 DIAGNOSIS — T8189XA Other complications of procedures, not elsewhere classified, initial encounter: Secondary | ICD-10-CM | POA: Diagnosis not present

## 2012-09-08 DIAGNOSIS — I10 Essential (primary) hypertension: Secondary | ICD-10-CM | POA: Diagnosis not present

## 2012-09-08 DIAGNOSIS — I4891 Unspecified atrial fibrillation: Secondary | ICD-10-CM | POA: Diagnosis not present

## 2012-09-09 DIAGNOSIS — I10 Essential (primary) hypertension: Secondary | ICD-10-CM | POA: Diagnosis not present

## 2012-09-09 DIAGNOSIS — I4891 Unspecified atrial fibrillation: Secondary | ICD-10-CM | POA: Diagnosis not present

## 2012-09-09 DIAGNOSIS — T8189XA Other complications of procedures, not elsewhere classified, initial encounter: Secondary | ICD-10-CM | POA: Diagnosis not present

## 2012-09-09 DIAGNOSIS — IMO0001 Reserved for inherently not codable concepts without codable children: Secondary | ICD-10-CM | POA: Diagnosis not present

## 2012-09-09 DIAGNOSIS — E119 Type 2 diabetes mellitus without complications: Secondary | ICD-10-CM | POA: Diagnosis not present

## 2012-09-09 DIAGNOSIS — R269 Unspecified abnormalities of gait and mobility: Secondary | ICD-10-CM | POA: Diagnosis not present

## 2012-09-11 DIAGNOSIS — E119 Type 2 diabetes mellitus without complications: Secondary | ICD-10-CM | POA: Diagnosis not present

## 2012-09-11 DIAGNOSIS — I4891 Unspecified atrial fibrillation: Secondary | ICD-10-CM | POA: Diagnosis not present

## 2012-09-11 DIAGNOSIS — T8189XA Other complications of procedures, not elsewhere classified, initial encounter: Secondary | ICD-10-CM | POA: Diagnosis not present

## 2012-09-11 DIAGNOSIS — E782 Mixed hyperlipidemia: Secondary | ICD-10-CM | POA: Diagnosis not present

## 2012-09-11 DIAGNOSIS — E1065 Type 1 diabetes mellitus with hyperglycemia: Secondary | ICD-10-CM | POA: Diagnosis not present

## 2012-09-11 DIAGNOSIS — IMO0001 Reserved for inherently not codable concepts without codable children: Secondary | ICD-10-CM | POA: Diagnosis not present

## 2012-09-11 DIAGNOSIS — R269 Unspecified abnormalities of gait and mobility: Secondary | ICD-10-CM | POA: Diagnosis not present

## 2012-09-11 DIAGNOSIS — I10 Essential (primary) hypertension: Secondary | ICD-10-CM | POA: Diagnosis not present

## 2012-09-12 ENCOUNTER — Ambulatory Visit (HOSPITAL_COMMUNITY)
Admission: RE | Admit: 2012-09-12 | Discharge: 2012-09-12 | Disposition: A | Discharge status: Home / Self Care | Payer: Medicare Other | Source: Ambulatory Visit | Attending: Internal Medicine | Admitting: Internal Medicine

## 2012-09-12 ENCOUNTER — Other Ambulatory Visit (HOSPITAL_BASED_OUTPATIENT_CLINIC_OR_DEPARTMENT_OTHER): Payer: Medicare Other | Admitting: Lab

## 2012-09-12 DIAGNOSIS — Z9089 Acquired absence of other organs: Secondary | ICD-10-CM | POA: Insufficient documentation

## 2012-09-12 DIAGNOSIS — IMO0001 Reserved for inherently not codable concepts without codable children: Secondary | ICD-10-CM | POA: Diagnosis not present

## 2012-09-12 DIAGNOSIS — M47814 Spondylosis without myelopathy or radiculopathy, thoracic region: Secondary | ICD-10-CM | POA: Insufficient documentation

## 2012-09-12 DIAGNOSIS — R269 Unspecified abnormalities of gait and mobility: Secondary | ICD-10-CM | POA: Diagnosis not present

## 2012-09-12 DIAGNOSIS — T8189XA Other complications of procedures, not elsewhere classified, initial encounter: Secondary | ICD-10-CM | POA: Diagnosis not present

## 2012-09-12 DIAGNOSIS — I4891 Unspecified atrial fibrillation: Secondary | ICD-10-CM | POA: Diagnosis not present

## 2012-09-12 DIAGNOSIS — E119 Type 2 diabetes mellitus without complications: Secondary | ICD-10-CM | POA: Diagnosis not present

## 2012-09-12 DIAGNOSIS — I2510 Atherosclerotic heart disease of native coronary artery without angina pectoris: Secondary | ICD-10-CM | POA: Insufficient documentation

## 2012-09-12 DIAGNOSIS — I10 Essential (primary) hypertension: Secondary | ICD-10-CM | POA: Diagnosis not present

## 2012-09-12 DIAGNOSIS — C3490 Malignant neoplasm of unspecified part of unspecified bronchus or lung: Secondary | ICD-10-CM

## 2012-09-12 LAB — CBC WITH DIFFERENTIAL/PLATELET
BASO%: 0.4 % (ref 0.0–2.0)
MCHC: 34.1 g/dL (ref 31.5–36.0)
MONO#: 0.4 10*3/uL (ref 0.1–0.9)
RBC: 4.21 10*6/uL (ref 3.70–5.45)
WBC: 7.4 10*3/uL (ref 3.9–10.3)
lymph#: 1.2 10*3/uL (ref 0.9–3.3)

## 2012-09-12 LAB — COMPREHENSIVE METABOLIC PANEL (CC13)
ALT: 17 U/L (ref 0–55)
CO2: 30 mEq/L — ABNORMAL HIGH (ref 22–29)
Calcium: 9.9 mg/dL (ref 8.4–10.4)
Chloride: 99 mEq/L (ref 98–107)
Creatinine: 0.9 mg/dL (ref 0.6–1.1)
Sodium: 139 mEq/L (ref 136–145)
Total Protein: 7.4 g/dL (ref 6.4–8.3)

## 2012-09-13 ENCOUNTER — Ambulatory Visit (HOSPITAL_BASED_OUTPATIENT_CLINIC_OR_DEPARTMENT_OTHER): Payer: Medicare Other | Admitting: Internal Medicine

## 2012-09-13 ENCOUNTER — Encounter: Payer: Self-pay | Admitting: Internal Medicine

## 2012-09-13 ENCOUNTER — Telehealth: Payer: Self-pay | Admitting: Internal Medicine

## 2012-09-13 VITALS — BP 139/63 | HR 66 | Temp 96.7°F | Resp 20 | Ht 63.0 in | Wt 249.8 lb

## 2012-09-13 DIAGNOSIS — C3490 Malignant neoplasm of unspecified part of unspecified bronchus or lung: Secondary | ICD-10-CM

## 2012-09-13 DIAGNOSIS — C3410 Malignant neoplasm of upper lobe, unspecified bronchus or lung: Secondary | ICD-10-CM | POA: Diagnosis not present

## 2012-09-13 NOTE — Progress Notes (Signed)
[facility] [phone]   [phone]  OFFICE PROGRESS NOTE  Georgianne Fick, MD [address] [place] [address]  DIAGNOSIS: : Stage IA (T1b N0 MX) non-small cell lung cancer, adenocarcinoma diagnosed in February 2010.   PRIOR THERAPY: Status post left upper lobectomy with node dissection under the care of Dr. Edwyna Shell on Feb 11, 2009.   CURRENT THERAPY: Observation.  INTERVAL HISTORY: [name] 61 y.o. female returns to the clinic today for routine annual followup visit. The patient is doing fine today with no specific complaints. She was at the rehabilitation center for physical therapy after fracture of her ankle. She went home 2 weeks ago. She is feeling much better today with no significant weight loss or night sweats. She denied having any significant chest pain, shortness breath, cough or hemoptysis. She had repeat CT scan of the chest performed recently and she is here for evaluation and discussion of her scan results.  MEDICAL HISTORY: Past Medical History  Diagnosis Date  . Hypertension   . Hypercholesteremia   . Cancer   . Lung cancer   . Peripheral vascular disease   . Sleep apnea     " mild form ,does not use cpap "  . Diabetes mellitus     insulin dependent  . GERD (gastroesophageal reflux disease)   . H/O hiatal hernia   . Arthritis   . Non-STEMI (non-ST elevated myocardial infarction) 04/07/2012    See cath results below  . Presence of stent in LAD coronary artery 04/07/2012    Xience Expedition DES 2.75 mm x 18 mm (dilated to 3.0 mm)  . Paroxysmal atrial fibrillation 04/07/2012    On Warfarin  . CAD (coronary artery disease), native coronary artery 04/07/2012    Left Ventriculography:EF:  40-45%, Anterior hypokinesis; 99% LAD lesion @ SP1., 30-40% rPDA; otherwise angiographically normal    ALLERGIES:  is allergic to levofloxacin and morphine and related.  MEDICATIONS:  Current Outpatient  Prescriptions  Medication Sig Dispense Refill  . albuterol (PROVENTIL HFA;VENTOLIN HFA) 108 (90 BASE) MCG/ACT inhaler Inhale 1-2 puffs into the lungs every 6 (six) hours as needed for wheezing.  1 Inhaler  0  . cholecalciferol (VITAMIN D) 1000 UNITS tablet Take 2,000 Units by mouth at bedtime.      . clopidogrel (PLAVIX) 75 MG tablet Take 75 mg by mouth every morning.       . insulin glargine (LANTUS) 100 UNIT/ML injection Inject 15 Units into the skin at bedtime.      . insulin regular (NOVOLIN R,HUMULIN R) 100 units/mL injection Inject 5 Units into the skin 3 (three) times daily before meals.      Marland Kitchen lisinopril-hydrochlorothiazide (PRINZIDE,ZESTORETIC) 20-25 MG per tablet Take 1 tablet by mouth at bedtime.       . metoprolol tartrate (LOPRESSOR) 25 MG tablet Take 2 tablets (50 mg total) by mouth 2 (two) times daily.      Marland Kitchen oxyCODONE (OXY IR/ROXICODONE) 5 MG immediate release tablet Take 1-2 tablets (5-10 mg total) by mouth every 4 (four) hours as needed (moderate pain).  30 tablet  0  . pantoprazole (PROTONIX) 40 MG tablet Take 40 mg by mouth every morning.       . simvastatin (ZOCOR) 40 MG tablet Take 40 mg by mouth at bedtime.       Marland Kitchen warfarin (COUMADIN) 5 MG tablet Take 5-7.5 mg by mouth daily. Take as directed by [facility]      .  metoCLOPramide (REGLAN) 10 MG tablet Take 10 mg by mouth 4 (four) times daily as needed. Nausea        SURGICAL HISTORY:  Past Surgical History  Procedure Date  . Abdominal hysterectomy   . Cholecystectomy   . Left knee replacement   . Lung surgery   . Percutaneous coronary angioplasty and stenting 04/07/2012    Xience Expedition DES 2.71mm x 18 mm (dilated to 3.0 mm)  . Orif ankle fracture 07/09/2012    Procedure: OPEN REDUCTION INTERNAL FIXATION (ORIF) ANKLE FRACTURE;  Surgeon: Cammy Copa, MD;  Location: [facility];  Service: Orthopedics;  Laterality: Left;  open reduction internal fixation trimalleolar ankle fracture medial  malleolous fixation    REVIEW OF SYSTEMS:  A comprehensive review of systems was negative except for: Constitutional: positive for fatigue   PHYSICAL EXAMINATION: General appearance: alert, cooperative and no distress Head: Normocephalic, without obvious abnormality, atraumatic Neck: no adenopathy Resp: clear to auscultation bilaterally Cardio: regular rate and rhythm, S1, S2 normal, no murmur, click, rub or gallop GI: soft, non-tender; bowel sounds normal; no masses,  no organomegaly Extremities: extremities normal, atraumatic, no cyanosis or edema  ECOG PERFORMANCE STATUS: 1 - Symptomatic but completely ambulatory  Blood pressure 139/63, pulse 66, temperature 96.7 F (35.9 C), temperature source Oral, resp. rate 20, height 5\' 3"  (1.6 m), weight 249 lb 12.8 oz (113.309 kg).  LABORATORY DATA: Lab Results  Component Value Date   WBC 7.4 09/12/2012   HGB 12.1 09/12/2012   HCT 35.6 09/12/2012   MCV 84.7 09/12/2012   PLT 237 09/12/2012      Chemistry      Component Value Date/Time   NA 139 09/12/2012 0959   NA 136 07/07/2012 0432   NA 134 09/14/2010 0939   K 4.0 09/12/2012 0959   K 3.4* 07/07/2012 0432   K 3.9 09/14/2010 0939   CL 99 09/12/2012 0959   CL 98 07/07/2012 0432   CL 96* 09/14/2010 0939   CO2 30* 09/12/2012 0959   CO2 30 07/07/2012 0432   CO2 31 09/14/2010 0939   BUN 9.0 09/12/2012 0959   BUN 11 07/07/2012 0432   BUN 14 09/14/2010 0939   CREATININE 0.9 09/12/2012 0959   CREATININE 0.74 07/07/2012 0432   CREATININE 0.7 09/14/2010 0939      Component Value Date/Time   CALCIUM 9.9 09/12/2012 0959   CALCIUM 9.1 07/07/2012 0432   CALCIUM 9.3 09/14/2010 0939   ALKPHOS 138 09/12/2012 0959   ALKPHOS 81 09/09/2011 0958   ALKPHOS 93* 09/14/2010 0939   AST 18 09/12/2012 0959   AST 17 09/09/2011 0958   AST 23 09/14/2010 0939   ALT 17 09/12/2012 0959   ALT 11 09/09/2011 0958   BILITOT 0.44 09/12/2012 0959   BILITOT 0.5 09/09/2011 0958   BILITOT 0.60  09/14/2010 0939       RADIOGRAPHIC STUDIES: Ct Chest Wo Contrast  09/12/2012  *RADIOLOGY REPORT*  Clinical Data: Restaging lung cancer  CT CHEST WITHOUT CONTRAST  Technique:  Multidetector CT imaging of the chest was performed following the standard protocol without IV contrast.  Comparison: 09/09/2011  Findings:  Lungs/pleura: No pleural effusion identified.  Postop change from left upper lobectomy identified.  Wedge resection surgery been performed in the left lower lobe.  Right lung is clear.  Heart/Mediastinum:  Mild cardiac enlargement.  Prominent calcifications involving the left main, LAD, left circumflex and RCA coronary arteries noted.  Low left paratracheal lymph node measures 1 cm, image 18/series 2.  Stable from previous exam.  Upper abdomen: Normal appearance of the adrenal glands.  Previous cholecystectomy.  No abnormalities identified.  The  Bones/Musculoskeletal:  Mild spondylosis within the thoracic spine. No worrisome lytic or sclerotic bone lesions identified. Postsurgical changes involving the posterior left ribs noted.  IMPRESSION:  1.  Stable CT of the chest.  No evidence of recurrent or metastatic disease. 2.  Stable low left paratracheal lymph node.   Original Report Authenticated By: Signa Kell, M.D.     ASSESSMENT: This is a very pleasant 61 years old African American female with history of stage IA non-small cell lung cancer status post left upper lobectomy with lymph node dissection and has been observation since may of 2010 with no evidence for disease recurrence.  PLAN: I discussed the scan results with the patient today. I recommended for her to continue on observation with repeat CT scan of the chest without contrast in one year.  She was advised to call me immediately if she has any concerning symptoms in the interval.  All questions were answered. The patient knows to call the clinic with any problems, questions or concerns. We can certainly see the patient much  sooner if necessary.

## 2012-09-13 NOTE — Telephone Encounter (Signed)
appts made and printed for pt aom °

## 2012-09-13 NOTE — Patient Instructions (Signed)
Your scan showed no evidence for disease recurrence. Followup in one year with repeat CT scan of the chest 

## 2012-09-15 DIAGNOSIS — I10 Essential (primary) hypertension: Secondary | ICD-10-CM | POA: Diagnosis not present

## 2012-09-15 DIAGNOSIS — T8189XA Other complications of procedures, not elsewhere classified, initial encounter: Secondary | ICD-10-CM | POA: Diagnosis not present

## 2012-09-15 DIAGNOSIS — I4891 Unspecified atrial fibrillation: Secondary | ICD-10-CM | POA: Diagnosis not present

## 2012-09-15 DIAGNOSIS — IMO0001 Reserved for inherently not codable concepts without codable children: Secondary | ICD-10-CM | POA: Diagnosis not present

## 2012-09-15 DIAGNOSIS — E119 Type 2 diabetes mellitus without complications: Secondary | ICD-10-CM | POA: Diagnosis not present

## 2012-09-15 DIAGNOSIS — R269 Unspecified abnormalities of gait and mobility: Secondary | ICD-10-CM | POA: Diagnosis not present

## 2012-09-18 DIAGNOSIS — E119 Type 2 diabetes mellitus without complications: Secondary | ICD-10-CM | POA: Diagnosis not present

## 2012-09-18 DIAGNOSIS — I4891 Unspecified atrial fibrillation: Secondary | ICD-10-CM | POA: Diagnosis not present

## 2012-09-18 DIAGNOSIS — I10 Essential (primary) hypertension: Secondary | ICD-10-CM | POA: Diagnosis not present

## 2012-09-18 DIAGNOSIS — IMO0001 Reserved for inherently not codable concepts without codable children: Secondary | ICD-10-CM | POA: Diagnosis not present

## 2012-09-18 DIAGNOSIS — R269 Unspecified abnormalities of gait and mobility: Secondary | ICD-10-CM | POA: Diagnosis not present

## 2012-09-18 DIAGNOSIS — T8189XA Other complications of procedures, not elsewhere classified, initial encounter: Secondary | ICD-10-CM | POA: Diagnosis not present

## 2012-09-19 DIAGNOSIS — I4891 Unspecified atrial fibrillation: Secondary | ICD-10-CM | POA: Diagnosis not present

## 2012-09-19 DIAGNOSIS — T8189XA Other complications of procedures, not elsewhere classified, initial encounter: Secondary | ICD-10-CM | POA: Diagnosis not present

## 2012-09-19 DIAGNOSIS — I10 Essential (primary) hypertension: Secondary | ICD-10-CM | POA: Diagnosis not present

## 2012-09-19 DIAGNOSIS — E119 Type 2 diabetes mellitus without complications: Secondary | ICD-10-CM | POA: Diagnosis not present

## 2012-09-19 DIAGNOSIS — R269 Unspecified abnormalities of gait and mobility: Secondary | ICD-10-CM | POA: Diagnosis not present

## 2012-09-19 DIAGNOSIS — IMO0001 Reserved for inherently not codable concepts without codable children: Secondary | ICD-10-CM | POA: Diagnosis not present

## 2012-09-21 DIAGNOSIS — R269 Unspecified abnormalities of gait and mobility: Secondary | ICD-10-CM | POA: Diagnosis not present

## 2012-09-21 DIAGNOSIS — IMO0001 Reserved for inherently not codable concepts without codable children: Secondary | ICD-10-CM | POA: Diagnosis not present

## 2012-09-21 DIAGNOSIS — T8189XA Other complications of procedures, not elsewhere classified, initial encounter: Secondary | ICD-10-CM | POA: Diagnosis not present

## 2012-09-21 DIAGNOSIS — I10 Essential (primary) hypertension: Secondary | ICD-10-CM | POA: Diagnosis not present

## 2012-09-21 DIAGNOSIS — I4891 Unspecified atrial fibrillation: Secondary | ICD-10-CM | POA: Diagnosis not present

## 2012-09-21 DIAGNOSIS — E119 Type 2 diabetes mellitus without complications: Secondary | ICD-10-CM | POA: Diagnosis not present

## 2012-09-25 DIAGNOSIS — E119 Type 2 diabetes mellitus without complications: Secondary | ICD-10-CM | POA: Diagnosis not present

## 2012-09-25 DIAGNOSIS — I10 Essential (primary) hypertension: Secondary | ICD-10-CM | POA: Diagnosis not present

## 2012-09-25 DIAGNOSIS — IMO0001 Reserved for inherently not codable concepts without codable children: Secondary | ICD-10-CM | POA: Diagnosis not present

## 2012-09-25 DIAGNOSIS — I4891 Unspecified atrial fibrillation: Secondary | ICD-10-CM | POA: Diagnosis not present

## 2012-09-25 DIAGNOSIS — R269 Unspecified abnormalities of gait and mobility: Secondary | ICD-10-CM | POA: Diagnosis not present

## 2012-09-25 DIAGNOSIS — T8189XA Other complications of procedures, not elsewhere classified, initial encounter: Secondary | ICD-10-CM | POA: Diagnosis not present

## 2012-09-26 DIAGNOSIS — T8189XA Other complications of procedures, not elsewhere classified, initial encounter: Secondary | ICD-10-CM | POA: Diagnosis not present

## 2012-09-26 DIAGNOSIS — I4891 Unspecified atrial fibrillation: Secondary | ICD-10-CM | POA: Diagnosis not present

## 2012-09-26 DIAGNOSIS — R269 Unspecified abnormalities of gait and mobility: Secondary | ICD-10-CM | POA: Diagnosis not present

## 2012-09-26 DIAGNOSIS — I10 Essential (primary) hypertension: Secondary | ICD-10-CM | POA: Diagnosis not present

## 2012-09-26 DIAGNOSIS — E119 Type 2 diabetes mellitus without complications: Secondary | ICD-10-CM | POA: Diagnosis not present

## 2012-09-26 DIAGNOSIS — IMO0001 Reserved for inherently not codable concepts without codable children: Secondary | ICD-10-CM | POA: Diagnosis not present

## 2012-09-29 DIAGNOSIS — I10 Essential (primary) hypertension: Secondary | ICD-10-CM | POA: Diagnosis not present

## 2012-09-29 DIAGNOSIS — E119 Type 2 diabetes mellitus without complications: Secondary | ICD-10-CM | POA: Diagnosis not present

## 2012-09-29 DIAGNOSIS — R269 Unspecified abnormalities of gait and mobility: Secondary | ICD-10-CM | POA: Diagnosis not present

## 2012-09-29 DIAGNOSIS — IMO0001 Reserved for inherently not codable concepts without codable children: Secondary | ICD-10-CM | POA: Diagnosis not present

## 2012-09-29 DIAGNOSIS — T8189XA Other complications of procedures, not elsewhere classified, initial encounter: Secondary | ICD-10-CM | POA: Diagnosis not present

## 2012-09-29 DIAGNOSIS — I4891 Unspecified atrial fibrillation: Secondary | ICD-10-CM | POA: Diagnosis not present

## 2012-10-02 DIAGNOSIS — E1065 Type 1 diabetes mellitus with hyperglycemia: Secondary | ICD-10-CM | POA: Diagnosis not present

## 2012-10-02 DIAGNOSIS — E782 Mixed hyperlipidemia: Secondary | ICD-10-CM | POA: Diagnosis not present

## 2012-10-02 DIAGNOSIS — I4891 Unspecified atrial fibrillation: Secondary | ICD-10-CM | POA: Diagnosis not present

## 2012-10-02 DIAGNOSIS — I10 Essential (primary) hypertension: Secondary | ICD-10-CM | POA: Diagnosis not present

## 2012-10-02 DIAGNOSIS — R269 Unspecified abnormalities of gait and mobility: Secondary | ICD-10-CM | POA: Diagnosis not present

## 2012-10-02 DIAGNOSIS — T8189XA Other complications of procedures, not elsewhere classified, initial encounter: Secondary | ICD-10-CM | POA: Diagnosis not present

## 2012-10-02 DIAGNOSIS — M25569 Pain in unspecified knee: Secondary | ICD-10-CM | POA: Diagnosis not present

## 2012-10-02 DIAGNOSIS — E119 Type 2 diabetes mellitus without complications: Secondary | ICD-10-CM | POA: Diagnosis not present

## 2012-10-02 DIAGNOSIS — IMO0001 Reserved for inherently not codable concepts without codable children: Secondary | ICD-10-CM | POA: Diagnosis not present

## 2012-10-03 DIAGNOSIS — E119 Type 2 diabetes mellitus without complications: Secondary | ICD-10-CM | POA: Diagnosis not present

## 2012-10-03 DIAGNOSIS — I4891 Unspecified atrial fibrillation: Secondary | ICD-10-CM | POA: Diagnosis not present

## 2012-10-03 DIAGNOSIS — I10 Essential (primary) hypertension: Secondary | ICD-10-CM | POA: Diagnosis not present

## 2012-10-03 DIAGNOSIS — T8189XA Other complications of procedures, not elsewhere classified, initial encounter: Secondary | ICD-10-CM | POA: Diagnosis not present

## 2012-10-03 DIAGNOSIS — IMO0001 Reserved for inherently not codable concepts without codable children: Secondary | ICD-10-CM | POA: Diagnosis not present

## 2012-10-03 DIAGNOSIS — R269 Unspecified abnormalities of gait and mobility: Secondary | ICD-10-CM | POA: Diagnosis not present

## 2012-10-04 DIAGNOSIS — T8189XA Other complications of procedures, not elsewhere classified, initial encounter: Secondary | ICD-10-CM | POA: Diagnosis not present

## 2012-10-04 DIAGNOSIS — IMO0001 Reserved for inherently not codable concepts without codable children: Secondary | ICD-10-CM | POA: Diagnosis not present

## 2012-10-04 DIAGNOSIS — E119 Type 2 diabetes mellitus without complications: Secondary | ICD-10-CM | POA: Diagnosis not present

## 2012-10-04 DIAGNOSIS — I10 Essential (primary) hypertension: Secondary | ICD-10-CM | POA: Diagnosis not present

## 2012-10-04 DIAGNOSIS — R269 Unspecified abnormalities of gait and mobility: Secondary | ICD-10-CM | POA: Diagnosis not present

## 2012-10-04 DIAGNOSIS — I4891 Unspecified atrial fibrillation: Secondary | ICD-10-CM | POA: Diagnosis not present

## 2012-10-05 DIAGNOSIS — E119 Type 2 diabetes mellitus without complications: Secondary | ICD-10-CM | POA: Diagnosis not present

## 2012-10-05 DIAGNOSIS — IMO0001 Reserved for inherently not codable concepts without codable children: Secondary | ICD-10-CM | POA: Diagnosis not present

## 2012-10-05 DIAGNOSIS — R269 Unspecified abnormalities of gait and mobility: Secondary | ICD-10-CM | POA: Diagnosis not present

## 2012-10-05 DIAGNOSIS — T8189XA Other complications of procedures, not elsewhere classified, initial encounter: Secondary | ICD-10-CM | POA: Diagnosis not present

## 2012-10-05 DIAGNOSIS — I4891 Unspecified atrial fibrillation: Secondary | ICD-10-CM | POA: Diagnosis not present

## 2012-10-05 DIAGNOSIS — I10 Essential (primary) hypertension: Secondary | ICD-10-CM | POA: Diagnosis not present

## 2012-10-09 DIAGNOSIS — E119 Type 2 diabetes mellitus without complications: Secondary | ICD-10-CM | POA: Diagnosis not present

## 2012-10-09 DIAGNOSIS — R209 Unspecified disturbances of skin sensation: Secondary | ICD-10-CM | POA: Diagnosis not present

## 2012-10-09 DIAGNOSIS — E782 Mixed hyperlipidemia: Secondary | ICD-10-CM | POA: Diagnosis not present

## 2012-10-09 DIAGNOSIS — I10 Essential (primary) hypertension: Secondary | ICD-10-CM | POA: Diagnosis not present

## 2012-10-09 DIAGNOSIS — R269 Unspecified abnormalities of gait and mobility: Secondary | ICD-10-CM | POA: Diagnosis not present

## 2012-10-09 DIAGNOSIS — E1065 Type 1 diabetes mellitus with hyperglycemia: Secondary | ICD-10-CM | POA: Diagnosis not present

## 2012-10-09 DIAGNOSIS — IMO0001 Reserved for inherently not codable concepts without codable children: Secondary | ICD-10-CM | POA: Diagnosis not present

## 2012-10-09 DIAGNOSIS — I4891 Unspecified atrial fibrillation: Secondary | ICD-10-CM | POA: Diagnosis not present

## 2012-10-09 DIAGNOSIS — T8189XA Other complications of procedures, not elsewhere classified, initial encounter: Secondary | ICD-10-CM | POA: Diagnosis not present

## 2012-10-10 DIAGNOSIS — IMO0001 Reserved for inherently not codable concepts without codable children: Secondary | ICD-10-CM | POA: Diagnosis not present

## 2012-10-10 DIAGNOSIS — T8189XA Other complications of procedures, not elsewhere classified, initial encounter: Secondary | ICD-10-CM | POA: Diagnosis not present

## 2012-10-10 DIAGNOSIS — I4891 Unspecified atrial fibrillation: Secondary | ICD-10-CM | POA: Diagnosis not present

## 2012-10-10 DIAGNOSIS — I10 Essential (primary) hypertension: Secondary | ICD-10-CM | POA: Diagnosis not present

## 2012-10-10 DIAGNOSIS — R269 Unspecified abnormalities of gait and mobility: Secondary | ICD-10-CM | POA: Diagnosis not present

## 2012-10-10 DIAGNOSIS — E119 Type 2 diabetes mellitus without complications: Secondary | ICD-10-CM | POA: Diagnosis not present

## 2012-10-11 DIAGNOSIS — I4891 Unspecified atrial fibrillation: Secondary | ICD-10-CM | POA: Diagnosis not present

## 2012-10-11 DIAGNOSIS — T8189XA Other complications of procedures, not elsewhere classified, initial encounter: Secondary | ICD-10-CM | POA: Diagnosis not present

## 2012-10-11 DIAGNOSIS — I10 Essential (primary) hypertension: Secondary | ICD-10-CM | POA: Diagnosis not present

## 2012-10-11 DIAGNOSIS — IMO0001 Reserved for inherently not codable concepts without codable children: Secondary | ICD-10-CM | POA: Diagnosis not present

## 2012-10-11 DIAGNOSIS — R269 Unspecified abnormalities of gait and mobility: Secondary | ICD-10-CM | POA: Diagnosis not present

## 2012-10-11 DIAGNOSIS — E119 Type 2 diabetes mellitus without complications: Secondary | ICD-10-CM | POA: Diagnosis not present

## 2012-10-12 DIAGNOSIS — T8189XA Other complications of procedures, not elsewhere classified, initial encounter: Secondary | ICD-10-CM | POA: Diagnosis not present

## 2012-10-12 DIAGNOSIS — I4891 Unspecified atrial fibrillation: Secondary | ICD-10-CM | POA: Diagnosis not present

## 2012-10-12 DIAGNOSIS — E119 Type 2 diabetes mellitus without complications: Secondary | ICD-10-CM | POA: Diagnosis not present

## 2012-10-12 DIAGNOSIS — IMO0001 Reserved for inherently not codable concepts without codable children: Secondary | ICD-10-CM | POA: Diagnosis not present

## 2012-10-12 DIAGNOSIS — R269 Unspecified abnormalities of gait and mobility: Secondary | ICD-10-CM | POA: Diagnosis not present

## 2012-10-12 DIAGNOSIS — I10 Essential (primary) hypertension: Secondary | ICD-10-CM | POA: Diagnosis not present

## 2012-10-13 DIAGNOSIS — IMO0001 Reserved for inherently not codable concepts without codable children: Secondary | ICD-10-CM | POA: Diagnosis not present

## 2012-10-13 DIAGNOSIS — I10 Essential (primary) hypertension: Secondary | ICD-10-CM | POA: Diagnosis not present

## 2012-10-13 DIAGNOSIS — R269 Unspecified abnormalities of gait and mobility: Secondary | ICD-10-CM | POA: Diagnosis not present

## 2012-10-13 DIAGNOSIS — T8189XA Other complications of procedures, not elsewhere classified, initial encounter: Secondary | ICD-10-CM | POA: Diagnosis not present

## 2012-10-13 DIAGNOSIS — E119 Type 2 diabetes mellitus without complications: Secondary | ICD-10-CM | POA: Diagnosis not present

## 2012-10-13 DIAGNOSIS — I4891 Unspecified atrial fibrillation: Secondary | ICD-10-CM | POA: Diagnosis not present

## 2012-10-16 DIAGNOSIS — IMO0001 Reserved for inherently not codable concepts without codable children: Secondary | ICD-10-CM | POA: Diagnosis not present

## 2012-10-16 DIAGNOSIS — I10 Essential (primary) hypertension: Secondary | ICD-10-CM | POA: Diagnosis not present

## 2012-10-16 DIAGNOSIS — R269 Unspecified abnormalities of gait and mobility: Secondary | ICD-10-CM | POA: Diagnosis not present

## 2012-10-16 DIAGNOSIS — I4891 Unspecified atrial fibrillation: Secondary | ICD-10-CM | POA: Diagnosis not present

## 2012-10-16 DIAGNOSIS — E119 Type 2 diabetes mellitus without complications: Secondary | ICD-10-CM | POA: Diagnosis not present

## 2012-10-16 DIAGNOSIS — T8189XA Other complications of procedures, not elsewhere classified, initial encounter: Secondary | ICD-10-CM | POA: Diagnosis not present

## 2012-10-17 DIAGNOSIS — IMO0001 Reserved for inherently not codable concepts without codable children: Secondary | ICD-10-CM | POA: Diagnosis not present

## 2012-10-17 DIAGNOSIS — R269 Unspecified abnormalities of gait and mobility: Secondary | ICD-10-CM | POA: Diagnosis not present

## 2012-10-17 DIAGNOSIS — I10 Essential (primary) hypertension: Secondary | ICD-10-CM | POA: Diagnosis not present

## 2012-10-17 DIAGNOSIS — T8189XA Other complications of procedures, not elsewhere classified, initial encounter: Secondary | ICD-10-CM | POA: Diagnosis not present

## 2012-10-17 DIAGNOSIS — E119 Type 2 diabetes mellitus without complications: Secondary | ICD-10-CM | POA: Diagnosis not present

## 2012-10-17 DIAGNOSIS — I4891 Unspecified atrial fibrillation: Secondary | ICD-10-CM | POA: Diagnosis not present

## 2012-10-19 DIAGNOSIS — I4891 Unspecified atrial fibrillation: Secondary | ICD-10-CM | POA: Diagnosis not present

## 2012-10-19 DIAGNOSIS — T8189XA Other complications of procedures, not elsewhere classified, initial encounter: Secondary | ICD-10-CM | POA: Diagnosis not present

## 2012-10-19 DIAGNOSIS — R269 Unspecified abnormalities of gait and mobility: Secondary | ICD-10-CM | POA: Diagnosis not present

## 2012-10-19 DIAGNOSIS — E119 Type 2 diabetes mellitus without complications: Secondary | ICD-10-CM | POA: Diagnosis not present

## 2012-10-19 DIAGNOSIS — I10 Essential (primary) hypertension: Secondary | ICD-10-CM | POA: Diagnosis not present

## 2012-10-19 DIAGNOSIS — IMO0001 Reserved for inherently not codable concepts without codable children: Secondary | ICD-10-CM | POA: Diagnosis not present

## 2012-10-23 DIAGNOSIS — T8189XA Other complications of procedures, not elsewhere classified, initial encounter: Secondary | ICD-10-CM | POA: Diagnosis not present

## 2012-10-23 DIAGNOSIS — E119 Type 2 diabetes mellitus without complications: Secondary | ICD-10-CM | POA: Diagnosis not present

## 2012-10-23 DIAGNOSIS — R269 Unspecified abnormalities of gait and mobility: Secondary | ICD-10-CM | POA: Diagnosis not present

## 2012-10-23 DIAGNOSIS — IMO0001 Reserved for inherently not codable concepts without codable children: Secondary | ICD-10-CM | POA: Diagnosis not present

## 2012-10-23 DIAGNOSIS — I4891 Unspecified atrial fibrillation: Secondary | ICD-10-CM | POA: Diagnosis not present

## 2012-10-23 DIAGNOSIS — I10 Essential (primary) hypertension: Secondary | ICD-10-CM | POA: Diagnosis not present

## 2012-10-24 DIAGNOSIS — I10 Essential (primary) hypertension: Secondary | ICD-10-CM | POA: Diagnosis not present

## 2012-10-24 DIAGNOSIS — T8189XA Other complications of procedures, not elsewhere classified, initial encounter: Secondary | ICD-10-CM | POA: Diagnosis not present

## 2012-10-24 DIAGNOSIS — R269 Unspecified abnormalities of gait and mobility: Secondary | ICD-10-CM | POA: Diagnosis not present

## 2012-10-24 DIAGNOSIS — I4891 Unspecified atrial fibrillation: Secondary | ICD-10-CM | POA: Diagnosis not present

## 2012-10-24 DIAGNOSIS — IMO0001 Reserved for inherently not codable concepts without codable children: Secondary | ICD-10-CM | POA: Diagnosis not present

## 2012-10-24 DIAGNOSIS — E119 Type 2 diabetes mellitus without complications: Secondary | ICD-10-CM | POA: Diagnosis not present

## 2012-10-26 DIAGNOSIS — T8189XA Other complications of procedures, not elsewhere classified, initial encounter: Secondary | ICD-10-CM | POA: Diagnosis not present

## 2012-10-26 DIAGNOSIS — I10 Essential (primary) hypertension: Secondary | ICD-10-CM | POA: Diagnosis not present

## 2012-10-26 DIAGNOSIS — I4891 Unspecified atrial fibrillation: Secondary | ICD-10-CM | POA: Diagnosis not present

## 2012-10-26 DIAGNOSIS — R269 Unspecified abnormalities of gait and mobility: Secondary | ICD-10-CM | POA: Diagnosis not present

## 2012-10-26 DIAGNOSIS — E119 Type 2 diabetes mellitus without complications: Secondary | ICD-10-CM | POA: Diagnosis not present

## 2012-10-26 DIAGNOSIS — IMO0001 Reserved for inherently not codable concepts without codable children: Secondary | ICD-10-CM | POA: Diagnosis not present

## 2012-10-27 DIAGNOSIS — E119 Type 2 diabetes mellitus without complications: Secondary | ICD-10-CM | POA: Diagnosis not present

## 2012-10-27 DIAGNOSIS — I10 Essential (primary) hypertension: Secondary | ICD-10-CM | POA: Diagnosis not present

## 2012-10-27 DIAGNOSIS — R269 Unspecified abnormalities of gait and mobility: Secondary | ICD-10-CM | POA: Diagnosis not present

## 2012-10-27 DIAGNOSIS — IMO0001 Reserved for inherently not codable concepts without codable children: Secondary | ICD-10-CM | POA: Diagnosis not present

## 2012-10-27 DIAGNOSIS — T8189XA Other complications of procedures, not elsewhere classified, initial encounter: Secondary | ICD-10-CM | POA: Diagnosis not present

## 2012-10-27 DIAGNOSIS — I4891 Unspecified atrial fibrillation: Secondary | ICD-10-CM | POA: Diagnosis not present

## 2012-10-30 DIAGNOSIS — R269 Unspecified abnormalities of gait and mobility: Secondary | ICD-10-CM | POA: Diagnosis not present

## 2012-10-30 DIAGNOSIS — IMO0001 Reserved for inherently not codable concepts without codable children: Secondary | ICD-10-CM | POA: Diagnosis not present

## 2012-10-30 DIAGNOSIS — I4891 Unspecified atrial fibrillation: Secondary | ICD-10-CM | POA: Diagnosis not present

## 2012-10-30 DIAGNOSIS — E119 Type 2 diabetes mellitus without complications: Secondary | ICD-10-CM | POA: Diagnosis not present

## 2012-10-30 DIAGNOSIS — T8189XA Other complications of procedures, not elsewhere classified, initial encounter: Secondary | ICD-10-CM | POA: Diagnosis not present

## 2012-10-30 DIAGNOSIS — M25569 Pain in unspecified knee: Secondary | ICD-10-CM | POA: Diagnosis not present

## 2012-10-30 DIAGNOSIS — I10 Essential (primary) hypertension: Secondary | ICD-10-CM | POA: Diagnosis not present

## 2012-10-31 DIAGNOSIS — I10 Essential (primary) hypertension: Secondary | ICD-10-CM | POA: Diagnosis not present

## 2012-10-31 DIAGNOSIS — IMO0001 Reserved for inherently not codable concepts without codable children: Secondary | ICD-10-CM | POA: Diagnosis not present

## 2012-10-31 DIAGNOSIS — R269 Unspecified abnormalities of gait and mobility: Secondary | ICD-10-CM | POA: Diagnosis not present

## 2012-10-31 DIAGNOSIS — E119 Type 2 diabetes mellitus without complications: Secondary | ICD-10-CM | POA: Diagnosis not present

## 2012-10-31 DIAGNOSIS — T8189XA Other complications of procedures, not elsewhere classified, initial encounter: Secondary | ICD-10-CM | POA: Diagnosis not present

## 2012-10-31 DIAGNOSIS — I4891 Unspecified atrial fibrillation: Secondary | ICD-10-CM | POA: Diagnosis not present

## 2012-11-02 DIAGNOSIS — T8189XA Other complications of procedures, not elsewhere classified, initial encounter: Secondary | ICD-10-CM | POA: Diagnosis not present

## 2012-11-02 DIAGNOSIS — I10 Essential (primary) hypertension: Secondary | ICD-10-CM | POA: Diagnosis not present

## 2012-11-02 DIAGNOSIS — I4891 Unspecified atrial fibrillation: Secondary | ICD-10-CM | POA: Diagnosis not present

## 2012-11-02 DIAGNOSIS — IMO0001 Reserved for inherently not codable concepts without codable children: Secondary | ICD-10-CM | POA: Diagnosis not present

## 2012-11-02 DIAGNOSIS — E119 Type 2 diabetes mellitus without complications: Secondary | ICD-10-CM | POA: Diagnosis not present

## 2012-11-02 DIAGNOSIS — R269 Unspecified abnormalities of gait and mobility: Secondary | ICD-10-CM | POA: Diagnosis not present

## 2012-11-22 ENCOUNTER — Encounter (HOSPITAL_COMMUNITY): Payer: Self-pay | Admitting: Emergency Medicine

## 2012-11-22 ENCOUNTER — Observation Stay (HOSPITAL_COMMUNITY): Payer: Medicare Other

## 2012-11-22 ENCOUNTER — Observation Stay (HOSPITAL_COMMUNITY)
Admission: EM | Admit: 2012-11-22 | Discharge: 2012-11-24 | Disposition: A | Discharge status: Home / Self Care | Payer: Medicare Other | Attending: Cardiology | Admitting: Cardiology

## 2012-11-22 DIAGNOSIS — I480 Paroxysmal atrial fibrillation: Secondary | ICD-10-CM | POA: Diagnosis present

## 2012-11-22 DIAGNOSIS — Z85118 Personal history of other malignant neoplasm of bronchus and lung: Secondary | ICD-10-CM | POA: Insufficient documentation

## 2012-11-22 DIAGNOSIS — C3492 Malignant neoplasm of unspecified part of left bronchus or lung: Secondary | ICD-10-CM | POA: Diagnosis present

## 2012-11-22 DIAGNOSIS — R002 Palpitations: Secondary | ICD-10-CM | POA: Diagnosis present

## 2012-11-22 DIAGNOSIS — I4891 Unspecified atrial fibrillation: Secondary | ICD-10-CM | POA: Diagnosis not present

## 2012-11-22 DIAGNOSIS — E1169 Type 2 diabetes mellitus with other specified complication: Secondary | ICD-10-CM | POA: Diagnosis present

## 2012-11-22 DIAGNOSIS — R079 Chest pain, unspecified: Secondary | ICD-10-CM | POA: Diagnosis not present

## 2012-11-22 DIAGNOSIS — I2589 Other forms of chronic ischemic heart disease: Secondary | ICD-10-CM | POA: Diagnosis not present

## 2012-11-22 DIAGNOSIS — Z794 Long term (current) use of insulin: Secondary | ICD-10-CM | POA: Diagnosis not present

## 2012-11-22 DIAGNOSIS — M129 Arthropathy, unspecified: Secondary | ICD-10-CM | POA: Insufficient documentation

## 2012-11-22 DIAGNOSIS — I200 Unstable angina: Secondary | ICD-10-CM | POA: Diagnosis present

## 2012-11-22 DIAGNOSIS — Z7901 Long term (current) use of anticoagulants: Secondary | ICD-10-CM

## 2012-11-22 DIAGNOSIS — I10 Essential (primary) hypertension: Secondary | ICD-10-CM | POA: Diagnosis not present

## 2012-11-22 DIAGNOSIS — R0989 Other specified symptoms and signs involving the circulatory and respiratory systems: Secondary | ICD-10-CM | POA: Diagnosis not present

## 2012-11-22 DIAGNOSIS — I252 Old myocardial infarction: Secondary | ICD-10-CM | POA: Diagnosis not present

## 2012-11-22 DIAGNOSIS — I255 Ischemic cardiomyopathy: Secondary | ICD-10-CM | POA: Diagnosis present

## 2012-11-22 DIAGNOSIS — E785 Hyperlipidemia, unspecified: Secondary | ICD-10-CM | POA: Diagnosis present

## 2012-11-22 DIAGNOSIS — I739 Peripheral vascular disease, unspecified: Secondary | ICD-10-CM | POA: Diagnosis not present

## 2012-11-22 DIAGNOSIS — E119 Type 2 diabetes mellitus without complications: Secondary | ICD-10-CM | POA: Diagnosis not present

## 2012-11-22 DIAGNOSIS — I2510 Atherosclerotic heart disease of native coronary artery without angina pectoris: Secondary | ICD-10-CM | POA: Diagnosis present

## 2012-11-22 DIAGNOSIS — R6889 Other general symptoms and signs: Secondary | ICD-10-CM | POA: Diagnosis not present

## 2012-11-22 DIAGNOSIS — IMO0001 Reserved for inherently not codable concepts without codable children: Secondary | ICD-10-CM | POA: Diagnosis present

## 2012-11-22 DIAGNOSIS — E669 Obesity, unspecified: Secondary | ICD-10-CM | POA: Diagnosis present

## 2012-11-22 LAB — CBC WITH DIFFERENTIAL/PLATELET
Basophils Relative: 0 % (ref 0–1)
Eosinophils Relative: 3 % (ref 0–5)
HCT: 34.6 % — ABNORMAL LOW (ref 36.0–46.0)
Hemoglobin: 12 g/dL (ref 12.0–15.0)
Lymphs Abs: 1.8 10*3/uL (ref 0.7–4.0)
MCH: 28.3 pg (ref 26.0–34.0)
MCV: 81.6 fL (ref 78.0–100.0)
Monocytes Relative: 7 % (ref 3–12)
Neutro Abs: 4.8 10*3/uL (ref 1.7–7.7)
Platelets: 234 10*3/uL (ref 150–400)
RBC: 4.24 MIL/uL (ref 3.87–5.11)
WBC: 7.3 10*3/uL (ref 4.0–10.5)

## 2012-11-22 LAB — BASIC METABOLIC PANEL
CO2: 28 mEq/L (ref 19–32)
Chloride: 100 mEq/L (ref 96–112)
Creatinine, Ser: 0.65 mg/dL (ref 0.50–1.10)
Glucose, Bld: 196 mg/dL — ABNORMAL HIGH (ref 70–99)

## 2012-11-22 LAB — POCT I-STAT TROPONIN I: Troponin i, poc: 0.01 ng/mL (ref 0.00–0.08)

## 2012-11-22 LAB — HEMOGLOBIN A1C: Hgb A1c MFr Bld: 8.8 % — ABNORMAL HIGH (ref <5.7)

## 2012-11-22 LAB — GLUCOSE, CAPILLARY
Glucose-Capillary: 229 mg/dL — ABNORMAL HIGH (ref 70–99)
Glucose-Capillary: 208 mg/dL — ABNORMAL HIGH (ref 70–99)

## 2012-11-22 LAB — TROPONIN I
Troponin I: <0.3 ng/mL (ref <0.30)
Troponin I: <0.3 ng/mL (ref <0.30)

## 2012-11-22 LAB — TSH: TSH: 2.511 u[IU]/mL (ref 0.350–4.500)

## 2012-11-22 LAB — PROTIME-INR: INR: 2.23 — ABNORMAL HIGH (ref 0.00–1.49)

## 2012-11-22 MED ORDER — HYDROCHLOROTHIAZIDE 25 MG PO TABS
25.0000 mg | ORAL_TABLET | Freq: Every day | ORAL | Status: DC
  Administered 2012-11-22 – 2012-11-24 (×3): 25 mg via ORAL
  Filled 2012-11-22 (×3): qty 1

## 2012-11-22 MED ORDER — ONDANSETRON HCL 4 MG/2ML IJ SOLN: 4.0000 mg | Freq: Four times a day (QID) | INTRAMUSCULAR | Status: DC | PRN

## 2012-11-22 MED ORDER — METOPROLOL TARTRATE 25 MG PO TABS
37.5000 mg | ORAL_TABLET | Freq: Two times a day (BID) | ORAL | Status: DC
  Administered 2012-11-22: 37.5 mg via ORAL
  Filled 2012-11-22 (×2): qty 1

## 2012-11-22 MED ORDER — PANTOPRAZOLE SODIUM 40 MG PO TBEC
40.0000 mg | DELAYED_RELEASE_TABLET | Freq: Every morning | ORAL | Status: DC
  Administered 2012-11-22 – 2012-11-24 (×3): 40 mg via ORAL
  Filled 2012-11-22 (×3): qty 1

## 2012-11-22 MED ORDER — ONDANSETRON HCL 4 MG PO TABS: 4.0000 mg | ORAL_TABLET | Freq: Four times a day (QID) | ORAL | Status: DC | PRN

## 2012-11-22 MED ORDER — ASPIRIN 81 MG PO CHEW
324.0000 mg | CHEWABLE_TABLET | Freq: Once | ORAL | Status: AC
  Administered 2012-11-22: 324 mg via ORAL
  Filled 2012-11-22: qty 4

## 2012-11-22 MED ORDER — WARFARIN - PHARMACIST DOSING INPATIENT
Freq: Every day | Status: DC
  Administered 2012-11-23: 18:00:00

## 2012-11-22 MED ORDER — ALPRAZOLAM 0.25 MG PO TABS: 0.2500 mg | ORAL_TABLET | Freq: Three times a day (TID) | ORAL | Status: DC | PRN

## 2012-11-22 MED ORDER — NITROGLYCERIN 0.4 MG SL SUBL: 0.4000 mg | SUBLINGUAL_TABLET | SUBLINGUAL | Status: DC | PRN

## 2012-11-22 MED ORDER — SODIUM CHLORIDE 0.9 % IJ SOLN: 3.0000 mL | INTRAMUSCULAR | Status: DC | PRN

## 2012-11-22 MED ORDER — METOCLOPRAMIDE HCL 10 MG PO TABS
10.0000 mg | ORAL_TABLET | Freq: Four times a day (QID) | ORAL | Status: DC | PRN
  Filled 2012-11-22: qty 1

## 2012-11-22 MED ORDER — LISINOPRIL 20 MG PO TABS
20.0000 mg | ORAL_TABLET | Freq: Every day | ORAL | Status: DC
  Administered 2012-11-22 – 2012-11-24 (×3): 20 mg via ORAL
  Filled 2012-11-22 (×3): qty 1

## 2012-11-22 MED ORDER — SODIUM CHLORIDE 0.9 % IV SOLN: 250.0000 mL | INTRAVENOUS | Status: DC | PRN

## 2012-11-22 MED ORDER — WARFARIN SODIUM 7.5 MG PO TABS
7.5000 mg | ORAL_TABLET | Freq: Every day | ORAL | Status: DC
  Administered 2012-11-22 – 2012-11-24 (×3): 7.5 mg via ORAL
  Filled 2012-11-22 (×3): qty 1

## 2012-11-22 MED ORDER — SIMVASTATIN 40 MG PO TABS
40.0000 mg | ORAL_TABLET | Freq: Every day | ORAL | Status: DC
  Administered 2012-11-22 – 2012-11-23 (×2): 40 mg via ORAL
  Filled 2012-11-22 (×3): qty 1

## 2012-11-22 MED ORDER — SODIUM CHLORIDE 0.9 % IJ SOLN
3.0000 mL | Freq: Two times a day (BID) | INTRAMUSCULAR | Status: DC
  Administered 2012-11-22 – 2012-11-24 (×3): 3 mL via INTRAVENOUS

## 2012-11-22 MED ORDER — ZOLPIDEM TARTRATE 5 MG PO TABS: 5.0000 mg | ORAL_TABLET | Freq: Every evening | ORAL | Status: DC | PRN

## 2012-11-22 MED ORDER — TRAMADOL HCL 50 MG PO TABS: 50.0000 mg | ORAL_TABLET | Freq: Four times a day (QID) | ORAL | Status: DC | PRN

## 2012-11-22 MED ORDER — NITROGLYCERIN 0.3 MG SL SUBL: 0.3000 mg | SUBLINGUAL_TABLET | SUBLINGUAL | Status: DC | PRN

## 2012-11-22 MED ORDER — OXYCODONE-ACETAMINOPHEN 5-325 MG PO TABS
1.0000 | ORAL_TABLET | Freq: Four times a day (QID) | ORAL | Status: DC | PRN
Start: 2012-11-22 — End: 2012-11-24
  Administered 2012-11-22 – 2012-11-24 (×4): 1 via ORAL
  Filled 2012-11-22 (×4): qty 1

## 2012-11-22 MED ORDER — LISINOPRIL-HYDROCHLOROTHIAZIDE 20-25 MG PO TABS: 1.0000 | ORAL_TABLET | Freq: Every day | ORAL | Status: DC

## 2012-11-22 MED ORDER — SODIUM CHLORIDE 0.9 % IJ SOLN
3.0000 mL | Freq: Two times a day (BID) | INTRAMUSCULAR | Status: DC
  Administered 2012-11-22 – 2012-11-23 (×2): 3 mL via INTRAVENOUS

## 2012-11-22 MED ORDER — METOPROLOL TARTRATE 50 MG PO TABS
50.0000 mg | ORAL_TABLET | Freq: Two times a day (BID) | ORAL | Status: DC
  Administered 2012-11-22 – 2012-11-24 (×4): 50 mg via ORAL
  Filled 2012-11-22 (×5): qty 1

## 2012-11-22 MED ORDER — CLOPIDOGREL BISULFATE 75 MG PO TABS
75.0000 mg | ORAL_TABLET | Freq: Every morning | ORAL | Status: DC
  Administered 2012-11-22 – 2012-11-24 (×3): 75 mg via ORAL
  Filled 2012-11-22 (×3): qty 1

## 2012-11-22 MED ORDER — INSULIN ASPART 100 UNIT/ML ~~LOC~~ SOLN
0.0000 [IU] | Freq: Three times a day (TID) | SUBCUTANEOUS | Status: DC
  Administered 2012-11-22 (×2): 3 [IU] via SUBCUTANEOUS
  Administered 2012-11-23 (×2): 2 [IU] via SUBCUTANEOUS
  Administered 2012-11-23 – 2012-11-24 (×3): 3 [IU] via SUBCUTANEOUS

## 2012-11-22 NOTE — Consult Note (Signed)
Reason for Consult: Chest pain  Requesting Physician: [facility]  HPI: This is a 62 y.o.AA female  Followed by Dr Royann Shivers with a history of DM2, HTN, DLD, PAF on Coumadin, PVD (Lt SFA PTA 2012), and NSTEMI with LAD DES placement (7/13).  Last night she experienced 5 min of left sided, non radiating, non reproduceable CP.  Pt states that the pain was like a heaviness and not like her CP from her MI last year.  She did have some palpitations at that time.  Pt denies SOB, Diaphoresis, N/V, lightheadedness, or HA. She did not take anything for her pain at home. She called EMS and was transported to [facility] ER. She was admitted by the hospitalist. I stat is negative, EKG showed no acute changes. She is not currently experiencing any CP.  There are no aggrevating/alleviating factors.  She states that there is some LEE that she attributes to her trimaleolar fracture in 10/13.  PMHx:  Past Medical History  Diagnosis Date  . Hypertension   . Hypercholesteremia   . Cancer   . Lung cancer   . Peripheral vascular disease   . Sleep apnea     " mild form ,does not use cpap "  . Diabetes mellitus     insulin dependent  . GERD (gastroesophageal reflux disease)   . H/O hiatal hernia   . Arthritis   . Non-STEMI (non-ST elevated myocardial infarction) 04/07/2012    See cath results below  . Presence of stent in LAD coronary artery 04/07/2012    Xience Expedition DES 2.75 mm x 18 mm (dilated to 3.0 mm)  . Paroxysmal atrial fibrillation 04/07/2012    On Warfarin  . CAD (coronary artery disease), native coronary artery 04/07/2012    Left Ventriculography:EF:  40-45%, Anterior hypokinesis; 99% LAD lesion @ SP1., 30-40% rPDA; otherwise angiographically normal   Past Surgical History  Procedure Laterality Date  . Abdominal hysterectomy    . Cholecystectomy    . Left knee replacement    . Lung surgery    . Percutaneous coronary angioplasty and stenting  04/07/2012    Xience Expedition DES 2.60mm x 18 mm  (dilated to 3.0 mm)  . Orif ankle fracture  07/09/2012    Procedure: OPEN REDUCTION INTERNAL FIXATION (ORIF) ANKLE FRACTURE;  Surgeon: Cammy Copa, MD;  Location: [facility];  Service: Orthopedics;  Laterality: Left;  open reduction internal fixation trimalleolar ankle fracture medial malleolous fixation    FAMHx: Family History  Problem Relation Age of Onset  . Hypertension Mother   . Coronary artery disease Brother     SOCHx:  reports that she quit smoking about 28 years ago. Her smoking use included Cigarettes. She has a 20 pack-year smoking history. She has never used smokeless tobacco. She reports that she does not drink alcohol or use illicit drugs.  ALLERGIES: Allergies  Allergen Reactions  . Levofloxacin Itching  . Morphine And Related Itching    ROS: Pertinent items are noted in HPI. Some edema Lt LE after ankle surgery Oct 2013. ASA recently stopped- continued on Coumadin and Plavix.  HOME MEDICATIONS: Prescriptions prior to admission  Medication Sig Dispense Refill  . albuterol (PROVENTIL HFA;VENTOLIN HFA) 108 (90 BASE) MCG/ACT inhaler Inhale 1-2 puffs into the lungs every 6 (six) hours as needed for wheezing.  1 Inhaler  0  . cholecalciferol (VITAMIN D) 1000 UNITS tablet Take 1,000 Units by mouth at bedtime.       . clopidogrel (PLAVIX) 75 MG tablet Take  75 mg by mouth every morning.       Marland Kitchen ibuprofen (ADVIL,MOTRIN) 200 MG tablet Take 200-400 mg by mouth every 6 (six) hours as needed for pain or fever.      . insulin glargine (LANTUS) 100 UNIT/ML injection Inject 15 Units into the skin at bedtime.      . insulin regular (NOVOLIN R,HUMULIN R) 100 units/mL injection Inject 5 Units into the skin 3 (three) times daily before meals.      Marland Kitchen lisinopril-hydrochlorothiazide (PRINZIDE,ZESTORETIC) 20-25 MG per tablet Take 1 tablet by mouth at bedtime.       . methocarbamol (ROBAXIN) 500 MG tablet Take 500 mg by mouth every 6 (six) hours as needed. Muscle spasms      .  metoCLOPramide (REGLAN) 10 MG tablet Take 10 mg by mouth 4 (four) times daily as needed. Nausea      . metoprolol tartrate (LOPRESSOR) 25 MG tablet Take 37.5 mg by mouth 2 (two) times daily.      Marland Kitchen oxyCODONE-acetaminophen (PERCOCET/ROXICET) 5-325 MG per tablet Take 1 tablet by mouth every 6 (six) hours as needed for pain.      . pantoprazole (PROTONIX) 40 MG tablet Take 40 mg by mouth every morning.       . simvastatin (ZOCOR) 40 MG tablet Take 40 mg by mouth at bedtime.       Marland Kitchen warfarin (COUMADIN) 7.5 MG tablet Take 7.5 mg by mouth daily.        HOSPITAL MEDICATIONS: I have reviewed the patient's current medications.  VITALS: Blood pressure 179/60, pulse 69, temperature 97.8 F (36.6 C), temperature source Oral, resp. rate 18, height 5\' 3"  (1.6 m), weight 116.4 kg (256 lb 9.9 oz), SpO2 100.00%.  PHYSICAL EXAM: General appearance: alert, cooperative, morbidly obese and BMI 45 Neck: no carotid bruit and no JVD Lungs: clear to auscultation bilaterally Heart: regular rate and rhythm Abdomen: obese Extremities: 1+ bilat edema, surgical scar Lt LE Pulses: 2+ and symmetric Skin: Skin color, texture, turgor normal. No rashes or lesions Neurologic: Grossly normal  LABS: Results for orders placed during the hospital encounter of 11/22/12 (from the past 48 hour(s))  CBC WITH DIFFERENTIAL     Status: Abnormal   Collection Time    11/22/12  5:15 AM      Result Value Range   WBC 7.3  4.0 - 10.5 K/uL   Comment: WHITE COUNT CONFIRMED ON SMEAR   RBC 4.24  3.87 - 5.11 MIL/uL   Hemoglobin 12.0  12.0 - 15.0 g/dL   HCT 96.0 (*) 45.4 - 09.8 %   MCV 81.6  78.0 - 100.0 fL   MCH 28.3  26.0 - 34.0 pg   MCHC 34.7  30.0 - 36.0 g/dL   RDW 11.9  14.7 - 82.9 %   Platelets 234  150 - 400 K/uL   Comment: PLATELET COUNT CONFIRMED BY SMEAR   Neutrophils Relative 66  43 - 77 %   Lymphocytes Relative 24  12 - 46 %   Monocytes Relative 7  3 - 12 %   Eosinophils Relative 3  0 - 5 %   Basophils Relative 0  0  - 1 %   Neutro Abs 4.8  1.7 - 7.7 K/uL   Lymphs Abs 1.8  0.7 - 4.0 K/uL   Monocytes Absolute 0.5  0.1 - 1.0 K/uL   Eosinophils Absolute 0.2  0.0 - 0.7 K/uL   Basophils Absolute 0.0  0.0 - 0.1 K/uL   RBC Morphology  POLYCHROMASIA PRESENT    BASIC METABOLIC PANEL     Status: Abnormal   Collection Time    11/22/12  5:15 AM      Result Value Range   Sodium 137  135 - 145 mEq/L   Potassium 4.2  3.5 - 5.1 mEq/L   Comment: HEMOLYSIS AT THIS LEVEL MAY AFFECT RESULT   Chloride 100  96 - 112 mEq/L   CO2 28  19 - 32 mEq/L   Glucose, Bld 196 (*) 70 - 99 mg/dL   BUN 17  6 - 23 mg/dL   Creatinine, Ser 0.98  0.50 - 1.10 mg/dL   Calcium 9.5  8.4 - 11.9 mg/dL   GFR calc non Af Amer >90  >90 mL/min   GFR calc Af Amer >90  >90 mL/min   Comment:            The eGFR has been calculated     using the CKD EPI equation.     This calculation has not been     validated in all clinical     situations.     eGFR's persistently     <90 mL/min signify     possible Chronic Kidney Disease.  POCT I-STAT TROPONIN I     Status: None   Collection Time    11/22/12  5:22 AM      Result Value Range   Troponin i, poc 0.01  0.00 - 0.08 ng/mL   Comment 3            Comment: Due to the release kinetics of cTnI,     a negative result within the first hours     of the onset of symptoms does not rule out     myocardial infarction with certainty.     If myocardial infarction is still suspected,     repeat the test at appropriate intervals.  PROTIME-INR     Status: Abnormal   Collection Time    11/22/12  5:37 AM      Result Value Range   Prothrombin Time 23.7 (*) 11.6 - 15.2 seconds   INR 2.23 (*) 0.00 - 1.49  GLUCOSE, CAPILLARY     Status: Abnormal   Collection Time    11/22/12 11:21 AM      Result Value Range   Glucose-Capillary 229 (*) 70 - 99 mg/dL   Comment 1 Documented in Chart     Comment 2 Notify RN      IMAGING: Dg Chest 2 View  11/22/2012  *RADIOLOGY REPORT*  Clinical Data: Chest pain.  Left leg  pain and swelling.  CHEST - 2 VIEW  Comparison: 04/30/2012  Findings: Slightly shallow inspiration.  Mild cardiac enlargement with mild pulmonary vascular congestion, increasing since the previous study.  Postoperative changes in the left hilum and lower lung.  No blunting of costophrenic angles. No focal consolidation. No pneumothorax.  Mild degenerative changes in the spine.  IMPRESSION: Cardiac enlargement with mild developing pulmonary vascular congestion since previous study.  No focal consolidation or edema.   Original Report Authenticated By: Burman Nieves, M.D.     IMPRESSION: Active Problems:   CAD (coronary artery disease), LAD DES 04/07/12   PAF,    Palpitations   Chronic anticoagulation   ADENOCARCINOMA, LUNG, LEFT VATS, LUL lobectomy May 2010   DIABETES, TYPE 2   HYPERTENSION   PVD (peripheral vascular disease), Lt SFA PTA Aug 2012   ICM, EF 40-45% cath 04/07/12- improved to 50-60% by echo Oct  2013   Morbid obesity   Dyslipidemia   RECOMMENDATION: MD To see- Continue to cycle enzymes. Continue Coumadin unless Troponin turns positive. .Myoview in am. Hold Lantus tonight as she will be NPO.  Time Spent Directly with Patient: 45 minutes  Jamell Laymon K 11/22/2012, 11:24 AM

## 2012-11-22 NOTE — ED Notes (Signed)
MD at bedside. 

## 2012-11-22 NOTE — H&P (Signed)
[facility] hospitalist  Date: 11/22/2012               Patient Name:  [name] MRN: [id number]  DOB: 11/26/1950 Age / Sex: 62 y.o., female   PCP: [facility]              Chief Complaint: palpitations  History of Present Illness: Patient is a 62 y.o. female with a PMHx of coronary artery disease, diabetes, hypertension, hypercholesterolemia, who presents to [facility] for evaluation of acute onset chest discomfort a/w chest palpitations that began 3.30 AM on the morning of admission while the patient was awake and watching TV in bed. Pain is described as a dull chest pain located over mid chest. The pain is rated as a 3/10 in severity at onset and currently rated 0/10. The pain is notably aggravated by None. It is alleviated by none. has associated palpitations. For the pain, the patient tried nothing for pain relief. The whole episode lasted about 5 minutes. She says that she had similar pain and palpitations when she had a heart attack last year.  The patient has experienced similar symptoms previously. Secondary to her constellation of symptoms, [name] presented to [facility] for further evaluation. Otherwise, the patient denies diaphoresis, nausea, near syncope, shortness of breath and syncope.   Current Outpatient Medications:  (Not in a hospital admission)  Allergies: Allergies  Allergen Reactions  . Levofloxacin Itching  . Morphine And Related Itching     Past Medical History: Past Medical History  Diagnosis Date  . Hypertension   . Hypercholesteremia   . Cancer   . Lung cancer   . Peripheral vascular disease   . Sleep apnea     " mild form ,does not use cpap "  . Diabetes mellitus     insulin dependent  . GERD (gastroesophageal reflux disease)   . H/O hiatal hernia   . Arthritis   . Non-STEMI (non-ST elevated myocardial infarction) 04/07/2012    See cath results below  . Presence of stent in LAD coronary artery 04/07/2012    Xience Expedition DES 2.75 mm  x 18 mm (dilated to 3.0 mm)  . Paroxysmal atrial fibrillation 04/07/2012    On Warfarin  . CAD (coronary artery disease), native coronary artery 04/07/2012    Left Ventriculography:EF:  40-45%, Anterior hypokinesis; 99% LAD lesion @ SP1., 30-40% rPDA; otherwise angiographically normal    Past Surgical History: Past Surgical History  Procedure Laterality Date  . Abdominal hysterectomy    . Cholecystectomy    . Left knee replacement    . Lung surgery    . Percutaneous coronary angioplasty and stenting  04/07/2012    Xience Expedition DES 2.78mm x 18 mm (dilated to 3.0 mm)  . Orif ankle fracture  07/09/2012    Procedure: OPEN REDUCTION INTERNAL FIXATION (ORIF) ANKLE FRACTURE;  Surgeon: Cammy Copa, MD;  Location: [facility];  Service: Orthopedics;  Laterality: Left;  open reduction internal fixation trimalleolar ankle fracture medial malleolous fixation    Family History: Family History  Problem Relation Age of Onset  . Hypertension Mother   . Coronary artery disease Brother     Social History: History   Social History  . Marital Status: Single    Spouse Name: N/A    Number of Children: N/A  . Years of Education: N/A   Occupational History  . Not on file.   Social History Main Topics  . Smoking status: Former Smoker -- 1.00 packs/day for 20  years    Types: Cigarettes    Quit date: 09/27/1984  . Smokeless tobacco: Never Used  . Alcohol Use: No  . Drug Use: No  . Sexually Active: Not Currently    Birth Control/ Protection: Post-menopausal   Other Topics Concern  . Not on file   Social History Narrative  . No narrative on file    Review of Systems: Constitutional:  denies fever, chills, diaphoresis, appetite change and fatigue.  HEENT: denies photophobia, eye pain, redness, hearing loss, ear pain, congestion, sore throat, rhinorrhea, sneezing, neck pain, neck stiffness and tinnitus.  Respiratory: denies SOB, DOE, cough, chest tightness, and wheezing.   Cardiovascular: admits to chest pain, palpitations and leg swelling.  Gastrointestinal: denies nausea, vomiting, abdominal pain, diarrhea, constipation, blood in stool.  Genitourinary: denies dysuria, urgency, frequency, hematuria, flank pain and difficulty urinating.  Musculoskeletal: denies  myalgias, back pain, joint swelling, arthralgias and gait problem.   Skin: denies pallor, rash and wound.  Neurological: denies dizziness, seizures, syncope, weakness, light-headedness, numbness and headaches.   Hematological: denies adenopathy, easy bruising, personal or family bleeding history.  Psychiatric/ Behavioral: denies suicidal ideation, mood changes, confusion, nervousness, sleep disturbance and agitation.    Vital Signs: Blood pressure 138/48, pulse 63, temperature 97.5 F (36.4 C), temperature source Oral, resp. rate 14, SpO2 99.00%.  Physical Exam: General: Vital signs reviewed and noted. Well-developed, well-nourished, in no acute distress; alert, appropriate and cooperative throughout examination.  Head: Normocephalic, atraumatic.  Eyes: PERRL, EOMI, No signs of anemia or jaundince.  Nose: Mucous membranes moist, not inflammed, nonerythematous.  Throat: Oropharynx nonerythematous, no exudate appreciated.   Neck: No deformities, masses, or tenderness noted.Supple, No carotid Bruits, no JVD.  Lungs:  Normal respiratory effort. Clear to auscultation BL without crackles or wheezes.  Heart: RRR. S1 and S2 normal without gallop, murmur, or rubs.  Abdomen:  BS normoactive. Soft, Nondistended, non-tender.  No masses or organomegaly.  Extremities: Left ankle swelling(injury related)  Neurologic: A&O X3, CN II - XII are grossly intact. Motor strength is 5/5 in the all 4 extremities, Sensations intact to light touch, Cerebellar signs negative.  Skin: No visible rashes, scars.    Lab results: Basic Metabolic Panel:  Recent Labs  16/10/96 0515  NA 137  K 4.2  CL 100  CO2 28  GLUCOSE  196*  BUN 17  CREATININE 0.65  CALCIUM 9.5   CBC:  Recent Labs  11/22/12 0515  WBC 7.3  NEUTROABS 4.8  HGB 12.0  HCT 34.6*  MCV 81.6  PLT 234    Recent Labs  11/22/12 0537  LABPROT 23.7*  INR 2.23*   Cardiac enzymes have been negative so far  Imaging results:  No results found.   Other results:  EKG (11/22/2012) - Normal Sinus Rhythm , regular rate of approximately 80 bpm, normal axis, ST segments: ST depressions laterally.      Assessment & Plan: Pt is a 62 y.o. yo female with a PMHX of CAD, hypertension hyperlipidemia and diabetes mellitus, who presented to [facility] on 11/22/2012 with symptoms of epigastric area and mid chest chest pain associated with palpitations which lasted about 5 minutes, with EKG showing changes consistent with lateral wall ischemia and initial troponins negative. Therefore, interventions at this time will be focused on risk stratification and observation.    Chest pain associated with palpitations - patient does have known history of coronary artery disease. She also has cardiac risk factors including the following: diabetic  existing CAD  hypertension  hyperlipidemia  former smoker. Admission EKG showed lateral wall changes, and cardiac enzymes have been negative.   Admit to telemetry  Continue to cycle cardiac enzymes.  Continue beta blocker, statin therapy.  Continue morphine, oxygen, PRN nitroglycerin.  Check HgA1c for risk factor stratification.   Coumadin per pharmacy and Plavix  No aspirin at this time due to risk of bleeding from triple anticoagulation therapy  Patient is a patient of [facility] and the need to consult them in the morning.  I have continued all other medications as per home regimen.     DVT PPX - Coumadin with INR goal 2-3  CODE STATUS - Full  CONSULTS PLACED - None  Signed: Lars Mage, MD  11/22/2012, 6:34 AM

## 2012-11-22 NOTE — ED Notes (Signed)
Pt states was watching tv and she felt like her heart beating really fast. Pt states she was really scared and called EMS due to her history. EMS states patient B/P was 202/100, 98% on RA. Pt arrived with no chest pain, n/v, sweating. Pt states she feels comfortable and relaxed. Pt states she had stents placed in July 2013. Family at bedside. CBG 131. NSR on EKG per EMS

## 2012-11-22 NOTE — Evaluation (Signed)
Physical Therapy Evaluation Patient Details Name: [name] MRN: [id number] DOB: 12/23/1950 Today's Date: 11/22/2012 Time: 0865-7846 PT Time Calculation (min): 26 min  PT Assessment / Plan / Recommendation Clinical Impression  Pt. is 62 yo female admitted 2/26 with palpitations. Pt s s/p stent 7/13, L ankle fx 10/13. Pt. will benefit from PT while in acute care.no c/o chest pain for very short walk in room.     PT Assessment  Patient needs continued PT services    Follow Up Recommendations  Outpatient PT (when MD allows as planned PTA.)    Does the patient have the potential to tolerate intense rehabilitation      Barriers to Discharge        Equipment Recommendations  None recommended by PT    Recommendations for Other Services     Frequency Min 3X/week    Precautions / Restrictions Precautions Precautions: Fall   Pertinent Vitals/Pain No c/o     Mobility  Bed Mobility Bed Mobility: Not assessed Details for Bed Mobility Assistance: sitting on edge of bed. Transfers Transfers: Sit to Stand;Stand to Sit Sit to Stand: 5: Supervision;From bed Stand to Sit: To bed;5: Supervision Details for Transfer Assistance: stood at [facility], leans over fairly fr to stand. Ambulation/Gait Ambulation/Gait Assistance: 4: Min guard Ambulation Distance (Feet): 25 Feet Assistive device: Rolling walker Ambulation/Gait Assistance Details: pt lurches to the R when advancing L. Gait Pattern: Step-through pattern;Trunk flexed;Antalgic;Decreased hip/knee flexion - left;Decreased step length - left Gait velocity: decr.    Exercises     PT Diagnosis: Generalized weakness;Difficulty walking  PT Problem List: Decreased activity tolerance;Decreased mobility PT Treatment Interventions: Gait training;Functional mobility training   PT Goals Acute Rehab PT Goals PT Goal Formulation: With patient Time For Goal Achievement: 12/06/12 Potential to Achieve Goals: Good Pt will Ambulate: 51 - 150  feet;with modified independence PT Goal: Ambulate - Progress: Goal set today  Visit Information  Last PT Received On: 11/22/12 Assistance Needed: +1    Subjective Data  Subjective: I was just to start OPPT tomorrow. My knee is what bothers me Patient Stated Goal: to go home   Prior Functioning  Home Living Lives With: Son Available Help at Discharge: Family Type of Home: House Home Access: Level entry Home Layout: One level Bathroom Shower/Tub: Engineer, manufacturing systems: Standard Home Adaptive Equipment: Bedside commode/3-in-1;Walker - rolling;Quad cane Prior Function Level of Independence: Independent with assistive device(s) Driving: Yes Communication Communication: No difficulties    Cognition  Cognition Overall Cognitive Status: Appears within functional limits for tasks assessed/performed Arousal/Alertness: Awake/alert Orientation Level: Appears intact for tasks assessed Behavior During Session: [facility] for tasks performed    Extremity/Trunk Assessment Right Upper Extremity Assessment RUE ROM/Strength/Tone: [facility] for tasks assessed Left Upper Extremity Assessment LUE ROM/Strength/Tone: WFL for tasks assessed Right Lower Extremity Assessment RLE ROM/Strength/Tone: [facility] for tasks assessed Left Lower Extremity Assessment LLE ROM/Strength/Tone: Deficits LLE ROM/Strength/Tone Deficits: decreased Knee flexion, decreased ankle ROM/   Balance Balance Balance Assessed: Yes Static Sitting Balance Static Sitting - Level of Assistance: 7: Independent  End of Session PT - End of Session Activity Tolerance: Patient limited by fatigue Patient left: in chair;with call bell/phone within reach;with family/visitor present Nurse Communication: Mobility status  GP Functional Assessment Tool Used: clinical judgement. Functional Limitation: Mobility: Walking and moving around Mobility: Walking and Moving Around Current Status [phone]): At least 1 percent but less than 20 percent  impaired, limited or restricted Mobility: Walking and Moving Around Goal Status [phone]): 0 percent impaired, limited  or restricted   Rada Hay 11/22/2012, 3:43 [id number]

## 2012-11-22 NOTE — ED Notes (Signed)
MD at bedside. Consult 

## 2012-11-22 NOTE — Consult Note (Signed)
I have seen and evaluated the patient this AM along with Corine Shelter, PA. I agree with his findings, examination as well as impression recommendations.  62 y/o woman well known to [place] - mild NSTEMI in 03/2012 - DES PCI to LAD, & pAF, PAD.  Her MI Sx included prolonged SSCP with palpitations intermittently, but most notably the constant sever "aching discomfort in her chest".  She awoke early this AM with a thumping in her chest that would come & go.  The episode lasted ~5 min.  She had a similar episode this AM -- that was noted on TELE with frequent PVCs.  She has not had any of the SSCP c/w her prior MI angina.    Otherwise has been done well post MI/PCI, with the exception of her L ankle fxr & prolonged recovery.  Her exam is relatively benign with intermittent ectopy.    I am under-whelmed with her Sx as an anginal equivalent, but with her PMH, I feel it prudent to officially r/o MI.  If her Troponin levels are Negative, I would like to see how she does ambulating the halls -- as resting angina should be exacerbated with exertion.  If r/o MI & no Angina with walking -- Lexiscan Myoview in AM  Will increase BB dose back to 50mg  bid from 37.5 bid -- I think she is feeling her frequent PVCs & may have even had a short run of Afib this AM.  Continue Plavix  Continue coumadin as long as she rules out. - will need to hold if abnormal ST.  [facility] will gladly take over as primary service.  Appreciate [facility] assistance with the admission.  Marykay Lex, M.D., M.S. [facility] [address] [place], [place]  [address]  [phone] Pager # [phone] 11/22/2012 11:56 AM

## 2012-11-22 NOTE — ED Provider Notes (Signed)
Medical screening examination/treatment/procedure(s) were performed by non-physician practitioner and as supervising physician I was immediately available for consultation/collaboration.   Lyanne Co, MD 11/22/12 [phone]

## 2012-11-22 NOTE — ED Provider Notes (Signed)
History     CSN: [id number]  Arrival date & time 11/22/12  0422   First MD Initiated Contact with Patient 11/22/12 0424      Chief Complaint  Patient presents with  . Palpitations    (Consider location/radiation/quality/duration/timing/severity/associated sxs/prior treatment) HPI History provided by pt.   Pt developed non-radiating, mid-line chest fullness while lying in bed this morning.  Resolved spontaneously after 5 minutes.   Associated w/ palpitations, described as heart beating heavily.  Denies fever, cough, SOB, diaphoresis, N/V, lightheadedness.  H/o intermittent atrial fibrillation but this feels different.  Feels similar to NSTEMI in 03/2012.  Per prior chart, pt had PCI and stenting of LAD at that time.    Past Medical History  Diagnosis Date  . Hypertension   . Hypercholesteremia   . Cancer   . Lung cancer   . Peripheral vascular disease   . Sleep apnea     " mild form ,does not use cpap "  . Diabetes mellitus     insulin dependent  . GERD (gastroesophageal reflux disease)   . H/O hiatal hernia   . Arthritis   . Non-STEMI (non-ST elevated myocardial infarction) 04/07/2012    See cath results below  . Presence of stent in LAD coronary artery 04/07/2012    Xience Expedition DES 2.75 mm x 18 mm (dilated to 3.0 mm)  . Paroxysmal atrial fibrillation 04/07/2012    On Warfarin  . CAD (coronary artery disease), native coronary artery 04/07/2012    Left Ventriculography:EF:  40-45%, Anterior hypokinesis; 99% LAD lesion @ SP1., 30-40% rPDA; otherwise angiographically normal    Past Surgical History  Procedure Laterality Date  . Abdominal hysterectomy    . Cholecystectomy    . Left knee replacement    . Lung surgery    . Percutaneous coronary angioplasty and stenting  04/07/2012    Xience Expedition DES 2.40mm x 18 mm (dilated to 3.0 mm)  . Orif ankle fracture  07/09/2012    Procedure: OPEN REDUCTION INTERNAL FIXATION (ORIF) ANKLE FRACTURE;  Surgeon: Cammy Copa,  MD;  Location: [facility];  Service: Orthopedics;  Laterality: Left;  open reduction internal fixation trimalleolar ankle fracture medial malleolous fixation    Family History  Problem Relation Age of Onset  . Hypertension Mother   . Coronary artery disease Brother     History  Substance Use Topics  . Smoking status: Former Smoker -- 1.00 packs/day for 20 years    Types: Cigarettes    Quit date: 09/27/1984  . Smokeless tobacco: Never Used  . Alcohol Use: No    OB History   Grav Para Term Preterm Abortions TAB SAB Ect Mult Living                  Review of Systems  All other systems reviewed and are negative.    Allergies  Levofloxacin and Morphine and related  Home Medications   Current Outpatient Rx  Name  Route  Sig  Dispense  Refill  . albuterol (PROVENTIL HFA;VENTOLIN HFA) 108 (90 BASE) MCG/ACT inhaler   Inhalation   Inhale 1-2 puffs into the lungs every 6 (six) hours as needed for wheezing.   1 Inhaler   0   . cholecalciferol (VITAMIN D) 1000 UNITS tablet   Oral   Take 1,000 Units by mouth at bedtime.          . clopidogrel (PLAVIX) 75 MG tablet   Oral   Take 75 mg by mouth every morning.          Marland Kitchen  ibuprofen (ADVIL,MOTRIN) 200 MG tablet   Oral   Take 200-400 mg by mouth every 6 (six) hours as needed for pain or fever.         . insulin glargine (LANTUS) 100 UNIT/ML injection   Subcutaneous   Inject 15 Units into the skin at bedtime.         . insulin regular (NOVOLIN R,HUMULIN R) 100 units/mL injection   Subcutaneous   Inject 5 Units into the skin 3 (three) times daily before meals.         Marland Kitchen lisinopril-hydrochlorothiazide (PRINZIDE,ZESTORETIC) 20-25 MG per tablet   Oral   Take 1 tablet by mouth at bedtime.          . methocarbamol (ROBAXIN) 500 MG tablet   Oral   Take 500 mg by mouth every 6 (six) hours as needed. Muscle spasms         . metoCLOPramide (REGLAN) 10 MG tablet   Oral   Take 10 mg by mouth 4 (four) times daily as  needed. Nausea         . metoprolol tartrate (LOPRESSOR) 25 MG tablet   Oral   Take 37.5 mg by mouth 2 (two) times daily.         Marland Kitchen oxyCODONE-acetaminophen (PERCOCET/ROXICET) 5-325 MG per tablet   Oral   Take 1 tablet by mouth every 6 (six) hours as needed for pain.         . pantoprazole (PROTONIX) 40 MG tablet   Oral   Take 40 mg by mouth every morning.          . simvastatin (ZOCOR) 40 MG tablet   Oral   Take 40 mg by mouth at bedtime.          Marland Kitchen warfarin (COUMADIN) 7.5 MG tablet   Oral   Take 7.5 mg by mouth daily.           BP 173/49  Pulse 75  Temp(Src) 97.5 F (36.4 C) (Oral)  Resp 16  SpO2 100%  Physical Exam  Nursing note and vitals reviewed. Constitutional: She is oriented to person, place, and time. She appears well-developed and well-nourished. No distress.  HENT:  Head: Normocephalic and atraumatic.  Eyes:  Normal appearance  Neck: Normal range of motion.  Cardiovascular: Normal rate, regular rhythm and intact distal pulses.   Pulmonary/Chest: Effort normal and breath sounds normal. No respiratory distress. She exhibits no tenderness.  No pleuritic pain reported  Abdominal: Soft. Bowel sounds are normal. She exhibits no distension. There is no tenderness. There is no guarding.  obese  Musculoskeletal: Normal range of motion.  No peripheral edema.  Tenderness bilateral calves.   Neurological: She is alert and oriented to person, place, and time.  Skin: Skin is warm and dry. No rash noted.  Psychiatric: She has a normal mood and affect. Her behavior is normal.    ED Course  Procedures (including critical care time)   Date: 11/22/2012  Rate: 80  Rhythm: normal sinus rhythm  QRS Axis: normal  Intervals: normal  ST/T Wave abnormalities: normal  Conduction Disutrbances:incomplete RBBB  Narrative Interpretation:   Old EKG Reviewed: unchanged   Labs Reviewed  CBC WITH DIFFERENTIAL - Abnormal; Notable for the following:    HCT 34.6  (*)    All other components within normal limits  BASIC METABOLIC PANEL - Abnormal; Notable for the following:    Glucose, Bld 196 (*)    All other components within normal limits  PROTIME-INR  POCT I-STAT TROPONIN I   No results found.   1. Chest pain       MDM  62yo F w/ h/o CAD, s/p NSTEMI and PCI w/ stenting of LAD in 03/2012, as well as atrial fib, presents w/ chest fullness and palpitations.  Sx similar to those experienced at time of MI.  Currently asx.  EKG changed w/ lateral ST depression.  Troponin neg.  CXR pending.  Pt has received aspirin.    [facility] consulted for admission.  6:02 AM        Otilio Miu, PA-C 11/22/12 0603  Otilio Miu, PA-C 11/22/12 [phone]

## 2012-11-22 NOTE — Progress Notes (Signed)
Utilization Review Completed.[name] T2/26/2014  

## 2012-11-22 NOTE — Progress Notes (Signed)
ANTICOAGULATION CONSULT NOTE - Initial Consult  Pharmacy Consult for Coumadin Indication: atrial fibrillation  Allergies  Allergen Reactions  . Levofloxacin Itching  . Morphine And Related Itching    Patient Measurements: Height: 5\' 3"  (160 cm) Weight: 256 lb 9.9 oz (116.4 kg) IBW/kg (Calculated) : 52.4  Vital Signs: Temp: 97.8 F (36.6 C) (02/26 0745) Temp src: Oral (02/26 0745) BP: 179/60 mmHg (02/26 0935) Pulse Rate: 69 (02/26 0935)  Labs:  Recent Labs  11/22/12 0515 11/22/12 0537  HGB 12.0  --   HCT 34.6*  --   PLT 234  --   LABPROT  --  23.7*  INR  --  2.23*  CREATININE 0.65  --     Estimated Creatinine Clearance: 88.6 ml/min (by C-G formula based on Cr of 0.65).   Medical History: Past Medical History  Diagnosis Date  . Hypertension   . Hypercholesteremia   . Cancer   . Lung cancer   . Peripheral vascular disease   . Sleep apnea     " mild form ,does not use cpap "  . Diabetes mellitus     insulin dependent  . GERD (gastroesophageal reflux disease)   . H/O hiatal hernia   . Arthritis   . Non-STEMI (non-ST elevated myocardial infarction) 04/07/2012    See cath results below  . Presence of stent in LAD coronary artery 04/07/2012    Xience Expedition DES 2.75 mm x 18 mm (dilated to 3.0 mm)  . Paroxysmal atrial fibrillation 04/07/2012    On Warfarin  . CAD (coronary artery disease), native coronary artery 04/07/2012    Left Ventriculography:EF:  40-45%, Anterior hypokinesis; 99% LAD lesion @ SP1., 30-40% rPDA; otherwise angiographically normal   Assessment:   Chronic Coumadin for afib. INR therapeutic on home regimen of 7.5 mg daily. Patient reports INR of 3.0 ~2 weeks ago, skipped 1 dose, then took 3.75 mg the following day, then resumed 7.5 mg daily.  Next INR check planned for 3/5 at [facility].  Also on Plavix.  Goal of Therapy:  INR 2-3 Monitor platelets by anticoagulation protocol: Yes   Plan:   Continue Coumadin 7.5 mg daily.  PT/INR daily for now.  Marya Landry Pager: [phone] 11/22/2012,12:45 PM

## 2012-11-23 ENCOUNTER — Observation Stay (HOSPITAL_COMMUNITY): Payer: Medicare Other

## 2012-11-23 DIAGNOSIS — R079 Chest pain, unspecified: Secondary | ICD-10-CM | POA: Diagnosis not present

## 2012-11-23 LAB — BASIC METABOLIC PANEL
BUN: 14 mg/dL (ref 6–23)
CO2: 30 mEq/L (ref 19–32)
GFR calc non Af Amer: >90 mL/min (ref >90)
Glucose, Bld: 220 mg/dL — ABNORMAL HIGH (ref 70–99)
Potassium: 3.8 mEq/L (ref 3.5–5.1)
Sodium: 136 mEq/L (ref 135–145)

## 2012-11-23 LAB — CBC
Hemoglobin: 11.4 g/dL — ABNORMAL LOW (ref 12.0–15.0)
MCH: 28.4 pg (ref 26.0–34.0)
MCHC: 34.5 g/dL (ref 30.0–36.0)
MCV: 82.1 fL (ref 78.0–100.0)
RBC: 4.02 MIL/uL (ref 3.87–5.11)

## 2012-11-23 LAB — GLUCOSE, CAPILLARY
Glucose-Capillary: 168 mg/dL — ABNORMAL HIGH (ref 70–99)
Glucose-Capillary: 172 mg/dL — ABNORMAL HIGH (ref 70–99)
Glucose-Capillary: 245 mg/dL — ABNORMAL HIGH (ref 70–99)

## 2012-11-23 LAB — PROTIME-INR: Prothrombin Time: 25.6 seconds — ABNORMAL HIGH (ref 11.6–15.2)

## 2012-11-23 MED ORDER — REGADENOSON 0.4 MG/5ML IV SOLN
0.4000 mg | Freq: Once | INTRAVENOUS | Status: AC
  Administered 2012-11-23: 0.4 mg via INTRAVENOUS
  Filled 2012-11-23: qty 5

## 2012-11-23 NOTE — Progress Notes (Signed)
[facility]  Subjective: No further CP.  No SOB   Objective: Vital signs in last 24 hours: Temp:  [98.3 F (36.8 C)-98.6 F (37 C)] 98.3 F (36.8 C) (02/27 1346) Pulse Rate:  [57-98] 71 (02/27 1346) Resp:  [18-20] 20 (02/27 1346) BP: (121-174)/(48-80) 121/63 mmHg (02/27 1346) SpO2:  [98 %-100 %] 98 % (02/27 1346) Last BM Date: 11/21/12  Intake/Output from previous day: 02/26 0701 - 02/27 0700 In: 240 [P.O.:240] Out: -  Intake/Output this shift: Total I/O In: 360 [P.O.:360] Out: 400 [Urine:400]  Medications Current Facility-Administered Medications  Medication Dose Route Frequency Provider Last Rate Last Dose  . 0.9 %  sodium chloride infusion  250 mL Intravenous PRN Lars Mage, MD      . ALPRAZolam Prudy Feeler) tablet 0.25 mg  0.25 mg Oral TID PRN Abelino Derrick, PA      . clopidogrel (PLAVIX) tablet 75 mg  75 mg Oral q morning - 10a Lars Mage, MD   75 mg at 11/23/12 1149  . hydrochlorothiazide (HYDRODIURIL) tablet 25 mg  25 mg Oral Daily Pamella Pert, MD   25 mg at 11/23/12 1150  . insulin aspart (novoLOG) injection 0-9 Units  0-9 Units Subcutaneous TID WC Lars Mage, MD   2 Units at 11/23/12 1158  . lisinopril (PRINIVIL,ZESTRIL) tablet 20 mg  20 mg Oral Daily Pamella Pert, MD   20 mg at 11/23/12 1149  . metoCLOPramide (REGLAN) tablet 10 mg  10 mg Oral QID PRN Lars Mage, MD      . metoprolol (LOPRESSOR) tablet 50 mg  50 mg Oral BID Marykay Lex, MD   50 mg at 11/23/12 1149  . nitroGLYCERIN (NITROSTAT) SL tablet 0.4 mg  0.4 mg Sublingual Q5 min PRN Marykay Lex, MD      . ondansetron [facility]) tablet 4 mg  4 mg Oral Q6H PRN Lars Mage, MD       Or  . ondansetron (ZOFRAN) injection 4 mg  4 mg Intravenous Q6H PRN Lars Mage, MD      . oxyCODONE-acetaminophen (PERCOCET/ROXICET) 5-325 MG per tablet 1 tablet  1 tablet Oral Q6H PRN Lars Mage, MD   1 tablet at 11/23/12 1335  . pantoprazole (PROTONIX) EC tablet 40 mg  40 mg Oral q morning - 10a  Lars Mage, MD   40 mg at 11/23/12 1150  . simvastatin (ZOCOR) tablet 40 mg  40 mg Oral QHS Lars Mage, MD   40 mg at 11/22/12 2220  . sodium chloride 0.9 % injection 3 mL  3 mL Intravenous Q12H Lars Mage, MD   3 mL at 11/23/12 1150  . sodium chloride 0.9 % injection 3 mL  3 mL Intravenous Q12H Lars Mage, MD   3 mL at 11/22/12 1244  . sodium chloride 0.9 % injection 3 mL  3 mL Intravenous PRN Lars Mage, MD      . traMADol (ULTRAM) tablet 50 mg  50 mg Oral Q6H PRN Abelino Derrick, PA      . warfarin (COUMADIN) tablet 7.5 mg  7.5 mg Oral q1800 Dennie Fetters, RPH   7.5 mg at 11/22/12 1726  . Warfarin - Pharmacist Dosing Inpatient   Does not apply q1800 Dennie Fetters, RPH      . zolpidem (AMBIEN) tablet 5 mg  5 mg Oral QHS PRN Abelino Derrick, PA        PE: General appearance: alert, cooperative and no distress Lungs: clear to auscultation  bilaterally Heart: regular rate and rhythm, S1, S2 normal, no murmur, click, rub or gallop Extremities: 1+ LEE Pulses: 2+ and symmetric Skin: Warm and dry Neurologic: Grossly normal  Lab Results:   Recent Labs  11/22/12 0515 11/23/12 0430  WBC 7.3 5.8  HGB 12.0 11.4*  HCT 34.6* 33.0*  PLT 234 206   BMET  Recent Labs  11/22/12 0515 11/23/12 0430  NA 137 136  K 4.2 3.8  CL 100 100  CO2 28 30  GLUCOSE 196* 220*  BUN 17 14  CREATININE 0.65 0.62  CALCIUM 9.5 9.6   PT/INR  Recent Labs  11/22/12 0537 11/23/12 0430  LABPROT 23.7* 25.6*  INR 2.23* 2.47*    Assessment/Plan  Active Problems:   ADENOCARCINOMA, LUNG, LEFT VATS, LUL lobectomy May 2010   DIABETES, TYPE 2   HYPERTENSION   PAF,    PVD (peripheral vascular disease), Lt SFA PTA Aug 2012   CAD (coronary artery disease), LAD DES 04/07/12   ICM, EF 40-45% cath 04/07/12- improved to 50-60% by echo Oct 2013   Morbid obesity   Palpitations   Dyslipidemia   Chronic anticoagulation  Plan:  She is sitting in the chair talking on the phone.  No further CP.  The  patient ruled out for MI.  SP first part of Lexiscan myoview today.  She reported SOB, HA during the test.  Last BP controlled.  Sinus brady on tele.   LOS: 1 day    HAGER, BRYAN 11/23/2012 4:13 PM  I have seen and examined the patient along with HAGER, BRYAN, PA.  I have reviewed the chart, notes and new data.  I agree with PA's note.  Key new complaints: no further chest pain Key examination changes: no arrhythmia or signs of HF Key new findings / data: negative enzymes  PLAN: Will review results of myocardial perfusion study (which is being performed as a two day study).  Thurmon Fair, MD, [facility] [phone] 11/23/2012, 4:30 PM

## 2012-11-23 NOTE — Progress Notes (Signed)
ANTICOAGULATION CONSULT NOTE - Initial Consult  Pharmacy Consult for Coumadin Indication: atrial fibrillation  Allergies  Allergen Reactions  . Levofloxacin Itching  . Morphine And Related Itching    Patient Measurements: Height: 5\' 3"  (160 cm) Weight: 256 lb 9.9 oz (116.4 kg) IBW/kg (Calculated) : 52.4  Vital Signs: Temp: 98.4 F (36.9 C) (02/27 0359) Temp src: Oral (02/27 0359) BP: 149/68 mmHg (02/27 0930) Pulse Rate: 90 (02/27 0930)  Labs:  Recent Labs  11/22/12 0515 11/22/12 0537 11/22/12 1553 11/22/12 2132 11/23/12 0430  HGB 12.0  --   --   --  11.4*  HCT 34.6*  --   --   --  33.0*  PLT 234  --   --   --  206  LABPROT  --  23.7*  --   --  25.6*  INR  --  2.23*  --   --  2.47*  CREATININE 0.65  --   --   --  0.62  TROPONINI  --   --  <0.30 <0.30  --     Estimated Creatinine Clearance: 88.6 ml/min (by C-G formula based on Cr of 0.62).   Medical History: Past Medical History  Diagnosis Date  . Hypertension   . Hypercholesteremia   . Cancer   . Lung cancer   . Peripheral vascular disease   . Sleep apnea     " mild form ,does not use cpap "  . Diabetes mellitus     insulin dependent  . GERD (gastroesophageal reflux disease)   . H/O hiatal hernia   . Arthritis   . Non-STEMI (non-ST elevated myocardial infarction) 04/07/2012    See cath results below  . Presence of stent in LAD coronary artery 04/07/2012    Xience Expedition DES 2.75 mm x 18 mm (dilated to 3.0 mm)  . Paroxysmal atrial fibrillation 04/07/2012    On Warfarin  . CAD (coronary artery disease), native coronary artery 04/07/2012    Left Ventriculography:EF:  40-45%, Anterior hypokinesis; 99% LAD lesion @ SP1., 30-40% rPDA; otherwise angiographically normal   Assessment: 62 YOF on Chronic Coumadin for afib. INR therapeutic on home regimen of 7.5 mg daily. No bleeding note  Goal of Therapy:  INR 2-3 Monitor platelets by anticoagulation protocol: Yes   Plan:   Continue Coumadin 7.5 mg  daily.  PT/INR daily for now.  Bayard Hugger, PharmD, BCPS  Clinical Pharmacist  Pager: [phone]   11/23/2012,9:41 AM

## 2012-11-23 NOTE — Progress Notes (Signed)
Inpatient Diabetes Program Recommendations  AACE/ADA: New Consensus Statement on Inpatient Glycemic Control (2013)  Target Ranges:  Prepandial:   less than 140 mg/dL      Peak postprandial:   less than 180 mg/dL (1-2 hours)      Critically ill patients:  140 - 180 mg/dL  Results for [name], [name] (MRN [id number]) as of 11/23/2012 13:37  Ref. Range 11/22/2012 11:21 11/22/2012 16:29 11/22/2012 21:12 11/23/2012 06:12 11/23/2012 11:44  Glucose-Capillary Latest Range: 70-99 mg/dL 119 (H) 147 (H) 829 (H) 168 (H) 172 (H)   Inpatient Diabetes Program Recommendations Insulin - Basal: Start home dose Lantus 15 units  Thank you  Piedad Climes BSN, RN,CDE Inpatient Diabetes Coordinator [phone] (team pager)

## 2012-11-24 ENCOUNTER — Encounter (HOSPITAL_COMMUNITY): Payer: Medicare Other | Attending: Cardiology

## 2012-11-24 ENCOUNTER — Encounter (HOSPITAL_COMMUNITY): Payer: Medicare Other

## 2012-11-24 DIAGNOSIS — R079 Chest pain, unspecified: Secondary | ICD-10-CM | POA: Insufficient documentation

## 2012-11-24 DIAGNOSIS — R6889 Other general symptoms and signs: Secondary | ICD-10-CM | POA: Insufficient documentation

## 2012-11-24 LAB — PROTIME-INR
INR: 2.37 — ABNORMAL HIGH (ref 0.00–1.49)
Prothrombin Time: 24.8 seconds — ABNORMAL HIGH (ref 11.6–15.2)

## 2012-11-24 LAB — GLUCOSE, CAPILLARY: Glucose-Capillary: 181 mg/dL — ABNORMAL HIGH (ref 70–99)

## 2012-11-24 MED ORDER — TECHNETIUM TC 99M SESTAMIBI - CARDIOLITE: 30.0000 | Freq: Once | INTRAVENOUS | Status: AC | PRN

## 2012-11-24 MED ORDER — NITROGLYCERIN 0.4 MG SL SUBL: 0.4000 mg | SUBLINGUAL_TABLET | SUBLINGUAL | Status: DC | PRN

## 2012-11-24 MED ORDER — METOPROLOL TARTRATE 50 MG PO TABS: 50.0000 mg | ORAL_TABLET | Freq: Two times a day (BID) | ORAL | Status: DC

## 2012-11-24 MED ORDER — TECHNETIUM TC 99M SESTAMIBI - CARDIOLITE
30.0000 | Freq: Once | INTRAVENOUS | Status: AC | PRN
  Administered 2012-11-24: 30 via INTRAVENOUS

## 2012-11-24 NOTE — Progress Notes (Signed)
Pt. Seen and examined. Agree with the NP/PA-C note as written. I personally reviewed [name]'s nuclear stress test (and did her cardiac catheterization in 03/2012 which revealed a single proximal LAD lesion). There is significant bowel artifact (probably since she has known delayed gastric emptying and slow intestinal transit). There is mis-registration and an apical truncation artifact, which I believe was misinterpreted as a large scar at the apex (however, with normal wall motion and preserved LVEF? Which does not make sense).  Her symptoms are entirely atypical, enzymes have been negative and she is compliant on her medications. I do not feel that there is a need for further hospital work-up of her symptoms which have now disappeared. I discussed this with Dr. Royann Shivers over the phone and he agrees. Will plan discharge home today and follow-up with him or an MLP in the office in 7-10 days.  Chrystie Nose, MD, [facility] Attending Cardiologist [facility]

## 2012-11-24 NOTE — Progress Notes (Signed)
[facility]  Subjective: No complaints. Denies further chest pain.  Objective: Vital signs in last 24 hours: Temp:  [97.3 F (36.3 C)-98.3 F (36.8 C)] 97.8 F (36.6 C) (02/28 0500) Pulse Rate:  [54-71] 54 (02/28 0500) Resp:  [18-20] 18 (02/28 0500) BP: (118-157)/(61-95) 118/95 mmHg (02/28 0500) SpO2:  [97 %-98 %] 97 % (02/28 0500) Last BM Date: 11/23/12  Intake/Output from previous day: 02/27 0701 - 02/28 0700 In: 720 [P.O.:720] Out: 2000 [Urine:2000] Intake/Output this shift:    Medications Current Facility-Administered Medications  Medication Dose Route Frequency Provider Last Rate Last Dose  . 0.9 %  sodium chloride infusion  250 mL Intravenous PRN Lars Mage, MD      . ALPRAZolam Prudy Feeler) tablet 0.25 mg  0.25 mg Oral TID PRN Abelino Derrick, PA      . clopidogrel (PLAVIX) tablet 75 mg  75 mg Oral q morning - 10a Lars Mage, MD   75 mg at 11/23/12 1149  . hydrochlorothiazide (HYDRODIURIL) tablet 25 mg  25 mg Oral Daily Pamella Pert, MD   25 mg at 11/23/12 1150  . insulin aspart (novoLOG) injection 0-9 Units  0-9 Units Subcutaneous TID WC Lars Mage, MD   3 Units at 11/24/12 [phone]  . lisinopril (PRINIVIL,ZESTRIL) tablet 20 mg  20 mg Oral Daily Pamella Pert, MD   20 mg at 11/23/12 1149  . metoCLOPramide (REGLAN) tablet 10 mg  10 mg Oral QID PRN Lars Mage, MD      . metoprolol (LOPRESSOR) tablet 50 mg  50 mg Oral BID Marykay Lex, MD   50 mg at 11/23/12 2257  . nitroGLYCERIN (NITROSTAT) SL tablet 0.4 mg  0.4 mg Sublingual Q5 min PRN Marykay Lex, MD      . ondansetron [facility]) tablet 4 mg  4 mg Oral Q6H PRN Lars Mage, MD       Or  . ondansetron (ZOFRAN) injection 4 mg  4 mg Intravenous Q6H PRN Lars Mage, MD      . oxyCODONE-acetaminophen (PERCOCET/ROXICET) 5-325 MG per tablet 1 tablet  1 tablet Oral Q6H PRN Lars Mage, MD   1 tablet at 11/24/12 0017  . pantoprazole (PROTONIX) EC tablet 40 mg  40 mg Oral q morning - 10a Lars Mage, MD    40 mg at 11/23/12 1150  . simvastatin (ZOCOR) tablet 40 mg  40 mg Oral QHS Lars Mage, MD   40 mg at 11/23/12 2258  . sodium chloride 0.9 % injection 3 mL  3 mL Intravenous Q12H Lars Mage, MD   3 mL at 11/23/12 1150  . sodium chloride 0.9 % injection 3 mL  3 mL Intravenous Q12H Lars Mage, MD   3 mL at 11/23/12 2305  . sodium chloride 0.9 % injection 3 mL  3 mL Intravenous PRN Lars Mage, MD      . traMADol (ULTRAM) tablet 50 mg  50 mg Oral Q6H PRN Abelino Derrick, PA      . warfarin (COUMADIN) tablet 7.5 mg  7.5 mg Oral q1800 Dennie Fetters, RPH   7.5 mg at 11/23/12 1742  . Warfarin - Pharmacist Dosing Inpatient   Does not apply q1800 Dennie Fetters, RPH      . zolpidem (AMBIEN) tablet 5 mg  5 mg Oral QHS PRN Abelino Derrick, PA        PE: General appearance: alert, cooperative, no distress and moderately obese Lungs: clear to auscultation bilaterally Heart: regular rate and  rhythm, S1, S2 normal, no murmur, click, rub or gallop Pulses: 2+ and symmetric Skin: warm and dry Neurologic: Grossly normal  Lab Results:   Recent Labs  11/22/12 0515 11/23/12 0430  WBC 7.3 5.8  HGB 12.0 11.4*  HCT 34.6* 33.0*  PLT 234 206   BMET  Recent Labs  11/22/12 0515 11/23/12 0430  NA 137 136  K 4.2 3.8  CL 100 100  CO2 28 30  GLUCOSE 196* 220*  BUN 17 14  CREATININE 0.65 0.62  CALCIUM 9.5 9.6   PT/INR  Recent Labs  11/22/12 0537 11/23/12 0430 11/24/12 0905  LABPROT 23.7* 25.6* 24.8*  INR 2.23* 2.47* 2.37*    Cardiac Panel (last 3 results)  Recent Labs  11/22/12 1553 11/22/12 2132  TROPONINI <0.30 <0.30    Studies/Results: Lexiscan NST - final result is pending.   Assessment/Plan  Active Problems:   ADENOCARCINOMA, LUNG, LEFT VATS, LUL lobectomy May 2010   DIABETES, TYPE 2   HYPERTENSION   PAF,    PVD (peripheral vascular disease), Lt SFA PTA Aug 2012   CAD (coronary artery disease), LAD DES 04/07/12   ICM, EF 40-45% cath 04/07/12- improved to 50-60%  by echo Oct 2013   Morbid obesity   Palpitations   Dyslipidemia   Chronic anticoagulation  Plan: No further chest pain. Awaiting final NST radiology report. Radiologist and cardiologist interpretation to follow. The patient has known CAD. She had a NSTEMI in July 2013 and underwent PCI and stenting to the LAD with a DES. She is on Plavix, a BB, an ACE-I and statin. She is currently in NSR with a HR of 60. BP is stable at 118/95 Will continue to monitor for now.    LOS: 2 days    [name] [name] 11/24/2012 9:58 AM

## 2012-11-24 NOTE — Progress Notes (Signed)
Physical Therapy Treatment Patient Details Name: [name] MRN: [id number] DOB: Apr 30, 1951 Today's Date: 11/24/2012 Time: 4098-1191 PT Time Calculation (min): 19 min  PT Assessment / Plan / Recommendation Comments on Treatment Session  pt needs OPPT to strip down some of her bad habits, strenthen LE and trunk and re-gait train.  She is safe to go home today with son's help as needed.    Follow Up Recommendations  Outpatient PT     Does the patient have the potential to tolerate intense rehabilitation     Barriers to Discharge        Equipment Recommendations  None recommended by PT    Recommendations for Other Services    Frequency Min 3X/week   Plan Discharge plan remains appropriate;Frequency remains appropriate    Precautions / Restrictions Precautions Precautions: Fall Restrictions Weight Bearing Restrictions: No   Pertinent Vitals/Pain     Mobility  Bed Mobility Bed Mobility: Not assessed Transfers Transfers: Sit to Stand;Stand to Sit Sit to Stand: 5: Supervision;From chair/3-in-1 Stand to Sit: 5: Supervision;To chair/3-in-1 Details for Transfer Assistance: safe mobility Ambulation/Gait Ambulation/Gait Assistance: 5: Supervision Ambulation Distance (Feet): 220 Feet Assistive device: Rolling walker Ambulation/Gait Assistance Details: antalgic appearing with moderately large w/shift tR while L LE swings through.  Flexed posture Gait Pattern: Step-through pattern;Trunk flexed;Antalgic;Decreased hip/knee flexion - left;Decreased step length - left Gait velocity: decr.    Exercises     PT Diagnosis:    PT Problem List:   PT Treatment Interventions:     PT Goals Acute Rehab PT Goals Time For Goal Achievement: 12/06/12 Potential to Achieve Goals: Good Pt will Ambulate: 51 - 150 feet;with modified independence PT Goal: Ambulate - Progress: Progressing toward goal  Visit Information  Last PT Received On: 11/24/12 Assistance Needed: +1    Subjective  Data  Subjective: I'll start my OPPT soon.   Cognition  Cognition Arousal/Alertness: Awake/alert Orientation Level: Appears intact for tasks assessed Behavior During Session: [facility] for tasks performed    Balance     End of Session PT - End of Session Activity Tolerance: Patient tolerated treatment well Patient left: in chair;with call bell/phone within reach;with family/visitor present Nurse Communication: Mobility status   GP     Kamarii Carton, Eliseo Gum 11/24/2012, 3:54 PM  11/24/2012  [place] Bing, PT [phone] (pager)

## 2012-11-24 NOTE — Progress Notes (Signed)
ANTICOAGULATION CONSULT NOTE - Follow Up Consult  Pharmacy Consult for Coumadin Indication: atrial fibrillation  Patient Measurements: Height: 5\' 3"  (160 cm) Weight: 256 lb 9.9 oz (116.4 kg) IBW/kg (Calculated) : 52.4  Vital Signs: Temp: 97.8 F (36.6 C) (02/28 0500) Temp src: Oral (02/28 0500) BP: 118/95 mmHg (02/28 0500) Pulse Rate: 54 (02/28 0500)  Labs:  Recent Labs  11/22/12 0515 11/22/12 0537 11/22/12 1553 11/22/12 2132 11/23/12 0430 11/24/12 0905  HGB 12.0  --   --   --  11.4*  --   HCT 34.6*  --   --   --  33.0*  --   PLT 234  --   --   --  206  --   LABPROT  --  23.7*  --   --  25.6* 24.8*  INR  --  2.23*  --   --  2.47* 2.37*  CREATININE 0.65  --   --   --  0.62  --   TROPONINI  --   --  <0.30 <0.30  --   --     Estimated Creatinine Clearance: 88.6 ml/min (by C-G formula based on Cr of 0.62).  Assessment:  INR therapeutic x 3 consecutive days on home Coumadin regimen of 7.5 mg daily. Also on Plavix. Expecting next outpatient INR 11/29/12 at [facility].  Goal of Therapy:  INR 2-3 Monitor platelets by anticoagulation protocol: Yes   Plan:   Continue Coumadin 7.5 mg daily.  Change PT/INR to MWF.  Next 3/3 if still here.  Dennie Fetters, RPh Pager: [phone] 11/24/2012,11:21 AM

## 2012-11-24 NOTE — Care Management Note (Signed)
    Page 1 of 1   11/24/2012     4:21:24 PM   CARE MANAGEMENT NOTE 11/24/2012  Patient:  OPHELIA, SIPE   Account Number:  [id number]  Date Initiated:  11/24/2012  Documentation initiated by:  Walda Hertzog  Subjective/Objective Assessment:   PT ADM WITH CHEST PAIN ON 11/22/12.  PTA, PT RESIDES AT HOME AND IS INDEPENDENT.     Action/Plan:   PT STATES SHE HAD RECENTLY BEEN RECEIVING HHPT, BUT HAS NOW TRANSITIONED TO OUTPT PHYSICAL THERAPY.  PLANS TO DO THIS AT DR MURPHY/WAINER[facility]S OFFICE.  SHE DENIES ANY NEEDS FOR HOME.   Anticipated DC Date:  11/24/2012   Anticipated DC Plan:  HOME/SELF CARE      DC Planning Services  CM consult      Choice offered to / List presented to:             Status of service:  Completed, signed off Medicare Important Message given?   (If response is "NO", the following Medicare IM given date fields will be blank) Date Medicare IM given:   Date Additional Medicare IM given:    Discharge Disposition:  HOME/SELF CARE  Per UR Regulation:  Reviewed for med. necessity/level of care/duration of stay  If discussed at Long Length of Stay Meetings, dates discussed:    Comments:

## 2012-11-24 NOTE — Progress Notes (Signed)
PT Cancellation Note  Patient Details Name: [name] MRN: [id number] DOB: August 25, 1951   Cancelled Treatment:    Reason Eval/Treat Not Completed: Other (comment) (In Nuc med for test)   INGOLD,Savonna Birchmeier 11/24/2012, 9:01 AM [facility] [phone] (pager)

## 2012-11-24 NOTE — Progress Notes (Signed)
Inpatient Diabetes Program Recommendations  AACE/ADA: New Consensus Statement on Inpatient Glycemic Control (2013)  Target Ranges:  Prepandial:   less than 140 mg/dL      Peak postprandial:   less than 180 mg/dL (1-2 hours)      Critically ill patients:  140 - 180 mg/dL  Results for [name], [name] (MRN [id number]) as of 11/24/2012 14:46  Ref. Range 11/23/2012 06:12 11/23/2012 11:44 11/23/2012 16:50 11/23/2012 21:19 11/24/2012 06:09  Glucose-Capillary Latest Range: 70-99 mg/dL 086 (H) 578 (H) 469 (H) 196 (H) 221 (H)   Inpatient Diabetes Program Recommendations Insulin - Basal: Start home dose Lantus 15 units  Thank you  Piedad Climes BSN, RN,CDE Inpatient Diabetes Coordinator [phone] (team pager)

## 2012-11-28 DIAGNOSIS — R079 Chest pain, unspecified: Secondary | ICD-10-CM | POA: Diagnosis present

## 2012-11-28 NOTE — Discharge Summary (Signed)
Physician Discharge Summary  Patient ID: [name] MRN: [id number] DOB/AGE: 63-Mar-1952 63 y.o.  Admit date: 11/22/2012 Discharge date: 11/24/2012  Admission Diagnoses: Chest Pain  Discharge Diagnoses:  Principal Problem:   Chest pain Active Problems:   ADENOCARCINOMA, LUNG, LEFT VATS, LUL lobectomy May 2010   DIABETES, TYPE 2   HYPERTENSION   PAF,    PVD (peripheral vascular disease), Lt SFA PTA Aug 2012   CAD (coronary artery disease), LAD DES 04/07/12   ICM, EF 40-45% cath 04/07/12- improved to 50-60% by echo Oct 2013   Morbid obesity   Palpitations   Dyslipidemia   Chronic anticoagulation   Discharged Condition: stable  Hospital Course: This is a 62 y.o. AA female followed by Dr Royann Shivers with a history of DM2, HTN, DLD, PAF on Coumadin, PVD (Lt SFA PTA 2012), and NSTEMI with LAD DES placement (7/13). She presented to [facility] on 2/26 with complaints of SSCP, accompanied with palpitations. The chest pain was atypical and different from the angina associated with her MI in the past. Initial EKG was normal with no acute changes and initial troponin was negative. On telemetry, she was noted to have frequent PVCs. She was admitted for observation and to rule of MI.  She ultimately ruled out for MI with negative troponins x 3. She then underwent a NST. This was a two day study.  The study suggested a small infarction involving the left ventricular apex with possible surrounding peri infarct ischemia. There was minimal hypokinesia involving the left ventricular apex, extending towards the inferior wall. The EF was estimated to be 70%. Dr. Rennis Golden personally reviewed the films after the radiologist's interpretation. He concluded that there was mis-registration and an apical truncation artifact, which he believed was misinterpreted as a large scar at the apex. Considering that her symptoms were atypical, enzymes had been negative, and her pain had resolved, Dr. Rennis Golden felt that there was no need  for further hospital work-up. He discussed the situation with Dr. Royann Shivers over the phone and he agreed. The patient was last seen by Dr. Rennis Golden who felt that she was stable for discharge home. She was ordered to continue with her home medications, which included Plavix, a BB, an ACE-I and statin. Follow-up has been arranged at [facility] with Nada Boozer, NP.  Consults: None  Significant Diagnostic Studies: NST  RADIOLOGY REPORT*  Clinical Data: Chest pain  MYOCARDIAL IMAGING WITH SPECT (REST AND PHARMACOLOGIC-STRESS - 2  DAY PROTOCOL)  GATED LEFT VENTRICULAR WALL MOTION STUDY  LEFT VENTRICULAR EJECTION FRACTION  Technique: Standard myocardial SPECT imaging was performed after  intravenous injection of 30 mCi Tc-34m sestamibi at rest. On a  different day, intravenous infusion of lexiscan was performed  under supervision of the Cardiology staff. At peak effect of the  drug, 30 mCi Tc-68m sestamibi was injected intravenously and  standard myocardial SPECT imaging was performed. Quantitative  gated imaging was also performed to evaluate left ventricular wall  motion and estimate left ventricular ejection fraction.  Comparison: Chest radiograph - 11/22/2012; chest CT - 09/12/2012  Findings:  Review of the rotational raw images demonstrates significant breast  attenuation artifact on both the acquired stress and rest images.  Significant GI activity is noted on the rest and stress images.  There is mild patient motion artifact, worse on the provided rest  images.  SPECT imaging demonstrates a small area of non perfusion involving  the left ventricular apex. This finding is associated with moderate  amount of adjacent mismatched hypoperfusion  on the provided stress  images worrisome for peri infarct ischemia.  Quantitative gated analysis shows minimal amount of hypokinesia  within the left ventricular apex extending towards the inferior  wall.  The resting left ventricular ejection fraction is  70% with end-  diastolic volume of 95 ml and end-systolic volume of 28 ml.  IMPRESSION:  1. Multifactorial degraded examination secondary to a combination  of breast attenuation, motion and GI activity.  2. Suspected small infarction involving the left ventricular apex  with possible surrounding peri infarct ischemia.  3. Minimal hypokinesia involving the left ventricular apex  extending towards the inferior wall. Ejection fraction - 70%.  Above findings discussed with Dr. Royann Shivers at the time of procedure  completion.  Original Report Authenticated By: Tacey Ruiz, MD    Treatments: See Hospital Course  Discharge Exam: Blood pressure 134/62, pulse 55, temperature 97.8 F (36.6 C), temperature source Oral, resp. rate 18, height 5\' 3"  (1.6 m), weight 256 lb 9.9 oz (116.4 kg), SpO2 99.00%.   Disposition: 01-Home or Self Care      Discharge Orders   Future Appointments Provider Department Dept Phone   09/12/2013 10:00 AM [facility] Only [facility] [phone]   09/12/2013 11:00 AM [facility] [phone]   Patient to arrive 15 minutes prior to appointment time. No solid food 4 hours prior to exam. Liquids and Medicines are okay.   09/17/2013 11:00 AM Si Gaul, MD [facility] [phone]   Future Orders Complete By Expires     Diet - low sodium heart healthy  As directed     Increase activity slowly  As directed         Medication List    TAKE these medications       albuterol 108 (90 BASE) MCG/ACT inhaler  Commonly known as:  PROVENTIL HFA;VENTOLIN HFA  Inhale 1-2 puffs into the lungs every 6 (six) hours as needed for wheezing.     cholecalciferol 1000 UNITS tablet  Commonly known as:  VITAMIN D  Take 1,000 Units by mouth at bedtime.     clopidogrel 75 MG tablet  Commonly known as:  PLAVIX  Take 75 mg by mouth every morning.     ibuprofen 200 MG tablet   Commonly known as:  ADVIL,MOTRIN  Take 200-400 mg by mouth every 6 (six) hours as needed for pain or fever.     insulin glargine 100 UNIT/ML injection  Commonly known as:  LANTUS  Inject 15 Units into the skin at bedtime.     insulin regular 100 units/mL injection  Commonly known as:  NOVOLIN R,HUMULIN R  Inject 5 Units into the skin 3 (three) times daily before meals.     lisinopril-hydrochlorothiazide 20-25 MG per tablet  Commonly known as:  PRINZIDE,ZESTORETIC  Take 1 tablet by mouth at bedtime.     methocarbamol 500 MG tablet  Commonly known as:  ROBAXIN  Take 500 mg by mouth every 6 (six) hours as needed. Muscle spasms     metoCLOPramide 10 MG tablet  Commonly known as:  REGLAN  Take 10 mg by mouth 4 (four) times daily as needed. Nausea     metoprolol 50 MG tablet  Commonly known as:  LOPRESSOR  Take 1 tablet (50 mg total) by mouth 2 (two) times daily.     nitroGLYCERIN 0.4 MG SL tablet  Commonly known as:  NITROSTAT  Place 1 tablet (0.4 mg  total) under the tongue every 5 (five) minutes as needed for chest pain.     oxyCODONE-acetaminophen 5-325 MG per tablet  Commonly known as:  PERCOCET/ROXICET  Take 1 tablet by mouth every 6 (six) hours as needed for pain.     pantoprazole 40 MG tablet  Commonly known as:  PROTONIX  Take 40 mg by mouth every morning.     simvastatin 40 MG tablet  Commonly known as:  ZOCOR  Take 40 mg by mouth at bedtime.     warfarin 7.5 MG tablet  Commonly known as:  COUMADIN  Take 7.5 mg by mouth daily.       Follow-up Information   Follow up with [facility] R, NP On 12/01/2012. (10:30 am )    Contact information:   [address] [place] [address] [phone]       Follow up with [facility] On 12/01/2012. (INR check 11:10 am )    Contact information:   [address] [place] [address] [phone]      Signed: Allayne Butcher, PA-C 11/28/2012, 5:19 PM

## 2012-11-29 DIAGNOSIS — E1065 Type 1 diabetes mellitus with hyperglycemia: Secondary | ICD-10-CM | POA: Diagnosis not present

## 2012-11-29 DIAGNOSIS — I10 Essential (primary) hypertension: Secondary | ICD-10-CM | POA: Diagnosis not present

## 2012-11-29 DIAGNOSIS — K922 Gastrointestinal hemorrhage, unspecified: Secondary | ICD-10-CM | POA: Diagnosis not present

## 2012-11-29 DIAGNOSIS — K625 Hemorrhage of anus and rectum: Secondary | ICD-10-CM | POA: Diagnosis not present

## 2012-11-29 DIAGNOSIS — E782 Mixed hyperlipidemia: Secondary | ICD-10-CM | POA: Diagnosis not present

## 2012-12-15 ENCOUNTER — Ambulatory Visit: Payer: Self-pay | Admitting: Cardiovascular Disease

## 2012-12-15 DIAGNOSIS — I480 Paroxysmal atrial fibrillation: Secondary | ICD-10-CM

## 2012-12-15 DIAGNOSIS — Z7901 Long term (current) use of anticoagulants: Secondary | ICD-10-CM

## 2012-12-19 DIAGNOSIS — S82899A Other fracture of unspecified lower leg, initial encounter for closed fracture: Secondary | ICD-10-CM | POA: Diagnosis not present

## 2012-12-19 DIAGNOSIS — M25569 Pain in unspecified knee: Secondary | ICD-10-CM | POA: Diagnosis not present

## 2012-12-19 DIAGNOSIS — R269 Unspecified abnormalities of gait and mobility: Secondary | ICD-10-CM | POA: Diagnosis not present

## 2012-12-21 DIAGNOSIS — S82899A Other fracture of unspecified lower leg, initial encounter for closed fracture: Secondary | ICD-10-CM | POA: Diagnosis not present

## 2012-12-21 DIAGNOSIS — M25569 Pain in unspecified knee: Secondary | ICD-10-CM | POA: Diagnosis not present

## 2012-12-21 DIAGNOSIS — R269 Unspecified abnormalities of gait and mobility: Secondary | ICD-10-CM | POA: Diagnosis not present

## 2012-12-26 DIAGNOSIS — R269 Unspecified abnormalities of gait and mobility: Secondary | ICD-10-CM | POA: Diagnosis not present

## 2012-12-26 DIAGNOSIS — S82899A Other fracture of unspecified lower leg, initial encounter for closed fracture: Secondary | ICD-10-CM | POA: Diagnosis not present

## 2012-12-26 DIAGNOSIS — M25569 Pain in unspecified knee: Secondary | ICD-10-CM | POA: Diagnosis not present

## 2012-12-28 DIAGNOSIS — S82899A Other fracture of unspecified lower leg, initial encounter for closed fracture: Secondary | ICD-10-CM | POA: Diagnosis not present

## 2012-12-28 DIAGNOSIS — R269 Unspecified abnormalities of gait and mobility: Secondary | ICD-10-CM | POA: Diagnosis not present

## 2012-12-28 DIAGNOSIS — M25569 Pain in unspecified knee: Secondary | ICD-10-CM | POA: Diagnosis not present

## 2012-12-29 DIAGNOSIS — I10 Essential (primary) hypertension: Secondary | ICD-10-CM | POA: Diagnosis not present

## 2012-12-29 DIAGNOSIS — I4891 Unspecified atrial fibrillation: Secondary | ICD-10-CM | POA: Diagnosis not present

## 2012-12-29 DIAGNOSIS — Z7901 Long term (current) use of anticoagulants: Secondary | ICD-10-CM | POA: Diagnosis not present

## 2012-12-29 DIAGNOSIS — I2510 Atherosclerotic heart disease of native coronary artery without angina pectoris: Secondary | ICD-10-CM | POA: Diagnosis not present

## 2012-12-29 DIAGNOSIS — E119 Type 2 diabetes mellitus without complications: Secondary | ICD-10-CM | POA: Diagnosis not present

## 2013-01-01 ENCOUNTER — Ambulatory Visit (INDEPENDENT_AMBULATORY_CARE_PROVIDER_SITE_OTHER): Payer: Medicare Other | Admitting: Gynecology

## 2013-01-01 ENCOUNTER — Encounter: Payer: Self-pay | Admitting: Gynecology

## 2013-01-01 VITALS — BP 126/84 | Ht 63.0 in | Wt 261.0 lb

## 2013-01-01 DIAGNOSIS — Z23 Encounter for immunization: Secondary | ICD-10-CM | POA: Diagnosis not present

## 2013-01-01 DIAGNOSIS — Z780 Asymptomatic menopausal state: Secondary | ICD-10-CM | POA: Diagnosis not present

## 2013-01-01 DIAGNOSIS — N3281 Overactive bladder: Secondary | ICD-10-CM | POA: Insufficient documentation

## 2013-01-01 DIAGNOSIS — N318 Other neuromuscular dysfunction of bladder: Secondary | ICD-10-CM

## 2013-01-01 DIAGNOSIS — M8580 Other specified disorders of bone density and structure, unspecified site: Secondary | ICD-10-CM | POA: Insufficient documentation

## 2013-01-01 DIAGNOSIS — N952 Postmenopausal atrophic vaginitis: Secondary | ICD-10-CM | POA: Insufficient documentation

## 2013-01-01 DIAGNOSIS — M949 Disorder of cartilage, unspecified: Secondary | ICD-10-CM | POA: Diagnosis not present

## 2013-01-01 DIAGNOSIS — M899 Disorder of bone, unspecified: Secondary | ICD-10-CM | POA: Diagnosis not present

## 2013-01-01 NOTE — Progress Notes (Signed)
[name] 1950-12-15 [id number]   History:    62 y.o.  for annual gyn exam new patient to the practice who was under the care of Dr. Nicholas Lose. Patient stated that in 1990 she had a total abdominal hysterectomy for leiomyomatous uteri. In 2010 she had a left upper lung lobectomy as a result of stage I a non-small cell adenocarcinoma and is being followed by the oncologist on a yearly basis with CT scans. Her primary physician Dr. Claiborne Rigg PCP has been treating her for hypertension, dyslipidemia, and type 2 diabetes. Her cardiologist has been treating her for PVD and had Lt SFA PTA Aug 2012.she status post cardiac catheterization by Dr. York Ram July 2013.  Patient denies any prior history of abnormal Pap smears. Her last mammogram April 2013. Last year she had benign colonic polyps removed. Her last bone density study 2012 demonstrating osteopenia.  Patient is morbidly obese. Patient had received her shingles are the Tdap vaccine.    Past medical history,surgical history, family history and social history were all reviewed and documented in the EPIC chart.  Gynecologic History No LMP recorded. Patient has had a hysterectomy. Contraception: status post hysterectomy Last Pap: 2012. Results were: normal Last mammogram: 2013. Results were: normal  Obstetric History OB History   Grav Para Term Preterm Abortions TAB SAB Ect Mult Living   1 1        1      # Outc Date GA Lbr Len/2nd Wgt Sex Del Anes PTL Lv   1 PAR                ROS: A ROS was performed and pertinent positives and negatives are included in the history.  GENERAL: No fevers or chills. HEENT: No change in vision, no earache, sore throat or sinus congestion. NECK: No pain or stiffness. CARDIOVASCULAR: No chest pain or pressure. No palpitations. PULMONARY: No shortness of breath, cough or wheeze. GASTROINTESTINAL: No abdominal pain, nausea, vomiting or diarrhea, melena or bright red blood per rectum. GENITOURINARY:  No urinary frequency, urgency, hesitancy or dysuria. MUSCULOSKELETAL: No joint or muscle pain, no back pain, no recent trauma. DERMATOLOGIC: No rash, no itching, no lesions. ENDOCRINE: No polyuria, polydipsia, no heat or cold intolerance. No recent change in weight. HEMATOLOGICAL: No anemia or easy bruising or bleeding. NEUROLOGIC: No headache, seizures, numbness, tingling or weakness. PSYCHIATRIC: No depression, no loss of interest in normal activity or change in sleep pattern.     Exam: chaperone present  BP 126/84  Ht 5\' 3"  (1.6 m)  Wt 261 lb (118.389 kg)  BMI 46.25 kg/m2  Body mass index is 46.25 kg/(m^2).  General appearance : Well developed well nourished female. No acute distress HEENT: Neck supple, trachea midline, no carotid bruits, no thyroidmegaly Lungs: Clear to auscultation, no rhonchi or wheezes, or rib retractions  Heart: Regular rate and rhythm, no murmurs or gallops Breast:Examined in sitting and supine position were symmetrical in appearance, no palpable masses or tenderness,  no skin retraction, no nipple inversion, no nipple discharge, no skin discoloration, no axillary or supraclavicular lymphadenopathy Abdomen: no palpable masses or tenderness, no rebound or guarding Extremities: no edema or skin discoloration or tenderness  Pelvic:  Bartholin, Urethra, Skene Glands: Within normal limits             Vagina: No gross lesions or discharge, short  Cervix: absent  Uterus absent  Adnexa difficult to assess due to patient's morbid obesity  Anus and perineum  normal   Rectovaginal  normal sphincter tone without palpated masses or tenderness             Hemoccult patient stated this year her primary physician had given Hemoccult card for testing was negative     Assessment/Plan:  61 y.o. female for annual exam postmenopausal overly obese making it difficult to assess rectum accepts and for this reason she will return to the office next week for an ultrasound. All blood  work was drawn by her primary physician. Due to patient's history of osteopenia we will check only vitamin D level. No Pap smear done today the new screening guidelines discussed. She was counseled for the shingles vaccine as well as for the Tdap vaccine. Literature information was provided on both subject. She did receive the Tdap vaccine in a prescription was given to her to go to her local pharmacy for her shingles vaccine. She'll return in the month of May for bone density study. She will schedule her mammogram the next few weeks. We discussed importance of monthly self breast examinations.    Ok Edwards MD, 2:52 PM 01/01/2013

## 2013-01-01 NOTE — Patient Instructions (Addendum)
Shingles Vaccine What You Need to Know WHAT IS SHINGLES?  Shingles is a painful skin rash, often with blisters. It is also called Herpes Zoster or just Zoster.  A shingles rash usually appears on one side of the face or body and lasts from 2 to 4 weeks. Its main symptom is pain, which can be quite severe. Other symptoms of shingles can include fever, headache, chills, and upset stomach. Very rarely, a shingles infection can lead to pneumonia, hearing problems, blindness, brain inflammation (encephalitis), or death.  For about 1 person in 5, severe pain can continue even after the rash clears up. This is called post-herpetic neuralgia.  Shingles is caused by the Varicella Zoster virus. This is the same virus that causes chickenpox. Only someone who has had a case of chickenpox or rarely, has gotten chickenpox vaccine, can get shingles. The virus stays in your body. It can reappear many years later to cause a case of shingles.  You cannot catch shingles from another person with shingles. However, a person who has never had chickenpox (or chickenpox vaccine) could get chickenpox from someone with shingles. This is not very common.  Shingles is far more common in people 50 and older than in younger people. It is also more common in people whose immune systems are weakened because of a disease such as cancer or drugs such as steroids or chemotherapy.  At least 1 million people get shingles per year in the [place]. SHINGLES VACCINE  A vaccine for shingles was licensed in 2006. In clinical trials, the vaccine reduced the risk of shingles by 50%. It can also reduce the pain in people who still get shingles after being vaccinated.  A single dose of shingles vaccine is recommended for adults 60 years of age and older. SOME PEOPLE SHOULD NOT GET SHINGLES VACCINE OR SHOULD WAIT A person should not get shingles vaccine if he or she:  Has ever had a life-threatening allergic reaction to gelatin, the  antibiotic neomycin, or any other component of shingles vaccine. Tell your caregiver if you have any severe allergies.  Has a weakened immune system because of current:  AIDS or another disease that affects the immune system.  Treatment with drugs that affect the immune system, such as prolonged use of high-dose steroids.  Cancer treatment, such as radiation or chemotherapy.  Cancer affecting the bone marrow or lymphatic system, such as leukemia or lymphoma.  Is pregnant, or might be pregnant. Women should not become pregnant until at least 4 weeks after getting shingles vaccine. Someone with a minor illness, such as a cold, may be vaccinated. Anyone with a moderate or severe acute illness should usually wait until he or she recovers before getting the vaccine. This includes anyone with a temperature of 101.3 F (38 C) or higher. WHAT ARE THE RISKS FROM SHINGLES VACCINE?  A vaccine, like any medicine, could possibly cause serious problems, such as severe allergic reactions. However, the risk of a vaccine causing serious harm, or death, is extremely small.  No serious problems have been identified with shingles vaccine. Mild Problems  Redness, soreness, swelling, or itching at the site of the injection (about 1 person in 3).  Headache (about 1 person in 70). Like all vaccines, shingles vaccine is being closely monitored for unusual or severe problems. WHAT IF THERE IS A MODERATE OR SEVERE REACTION? What should I look for? Any unusual condition, such as a severe allergic reaction or a high fever. If a severe allergic reaction   occurred, it would be within a few minutes to an hour after the shot. Signs of a serious allergic reaction can include difficulty breathing, weakness, hoarseness or wheezing, a fast heartbeat, hives, dizziness, paleness, or swelling of the throat. What should I do?  Call your caregiver, or get the person to a caregiver right away.  Tell the caregiver what  happened, the date and time it happened, and when the vaccination was given.  Ask the caregiver to report the reaction by filing a Vaccine Adverse Event Reporting System (VAERS) form. Or, you can file this report through the VAERS web site at www.vaers.hhs.gov or by calling 1-800-822-7967. VAERS does not provide medical advice. HOW CAN I LEARN MORE?  Ask your caregiver. He or she can give you the vaccine package insert or suggest other sources of information.  Contact the Centers for Disease Control and Prevention (CDC):  Call 1-800-232-4636 (1-800-CDC-INFO).  Visit the CDC website at www.cdc.gov/vaccines CDC Shingles Vaccine VIS (07/02/08) Document Released: 07/11/2006 Document Revised: 12/06/2011 Document Reviewed: 07/02/2008 ExitCare Patient Information 2013 [employer]. Tetanus, Diphtheria, Pertussis (Tdap) Vaccine What You Need to Know WHY GET VACCINATED? Tetanus, diphtheria and pertussis can be very serious diseases, even for adolescents and adults. Tdap vaccine can protect us from these diseases. TETANUS (Lockjaw) causes painful muscle tightening and stiffness, usually all over the body.  It can lead to tightening of muscles in the head and neck so you can't open your mouth, swallow, or sometimes even breathe. Tetanus kills about 1 out of 5 people who are infected. DIPHTHERIA can cause a thick coating to form in the back of the throat.  It can lead to breathing problems, paralysis, heart failure, and death. PERTUSSIS (Whooping Cough) causes severe coughing spells, which can cause difficulty breathing, vomiting and disturbed sleep.  It can also lead to weight loss, incontinence, and rib fractures. Up to 2 in 100 adolescents and 5 in 100 adults with pertussis are hospitalized or have complications, which could include pneumonia and death. These diseases are caused by bacteria. Diphtheria and pertussis are spread from person to person through coughing or sneezing. Tetanus enters  the body through cuts, scratches, or wounds. Before vaccines, the [place] saw as many as 200,000 cases a year of diphtheria and pertussis, and hundreds of cases of tetanus. Since vaccination began, tetanus and diphtheria have dropped by about 99% and pertussis by about 80%. TDAP VACCINE Tdap vaccine can protect adolescents and adults from tetanus, diphtheria, and pertussis. One dose of Tdap is routinely given at age 11 or 12. People who did not get Tdap at that age should get it as soon as possible. Tdap is especially important for health care professionals and anyone having close contact with a baby younger than 12 months. Pregnant women should get a dose of Tdap during every pregnancy, to protect the newborn from pertussis. Infants are most at risk for severe, life-threatening complications from pertussis. A similar vaccine, called Td, protects from tetanus and diphtheria, but not pertussis. A Td booster should be given every 10 years. Tdap may be given as one of these boosters if you have not already gotten a dose. Tdap may also be given after a severe cut or burn to prevent tetanus infection. Your doctor can give you more information. Tdap may safely be given at the same time as other vaccines. SOME PEOPLE SHOULD NOT GET THIS VACCINE  If you ever had a life-threatening allergic reaction after a dose of any tetanus, diphtheria, or pertussis containing   vaccine, OR if you have a severe allergy to any part of this vaccine, you should not get Tdap. Tell your doctor if you have any severe allergies.  If you had a coma, or long or multiple seizures within 7 days after a childhood dose of DTP or DTaP, you should not get Tdap, unless a cause other than the vaccine was found. You can still get Td.  Talk to your doctor if you:  have epilepsy or another nervous system problem,  had severe pain or swelling after any vaccine containing diphtheria, tetanus or pertussis,  ever had Guillain-Barr  Syndrome (GBS),  aren't feeling well on the day the shot is scheduled. RISKS OF A VACCINE REACTION With any medicine, including vaccines, there is a chance of side effects. These are usually mild and go away on their own, but serious reactions are also possible. Brief fainting spells can follow a vaccination, leading to injuries from falling. Sitting or lying down for about 15 minutes can help prevent these. Tell your doctor if you feel dizzy or light-headed, or have vision changes or ringing in the ears. Mild problems following Tdap (Did not interfere with activities)  Pain where the shot was given (about 3 in 4 adolescents or 2 in 3 adults)  Redness or swelling where the shot was given (about 1 person in 5)  Mild fever of at least 100.4F (up to about 1 in 25 adolescents or 1 in 100 adults)  Headache (about 3 or 4 people in 10)  Tiredness (about 1 person in 3 or 4)  Nausea, vomiting, diarrhea, stomach ache (up to 1 in 4 adolescents or 1 in 10 adults)  Chills, body aches, sore joints, rash, swollen glands (uncommon) Moderate problems following Tdap (Interfered with activities, but did not require medical attention)  Pain where the shot was given (about 1 in 5 adolescents or 1 in 100 adults)  Redness or swelling where the shot was given (up to about 1 in 16 adolescents or 1 in 25 adults)  Fever over 102F (about 1 in 100 adolescents or 1 in 250 adults)  Headache (about 3 in 20 adolescents or 1 in 10 adults)  Nausea, vomiting, diarrhea, stomach ache (up to 1 or 3 people in 100)  Swelling of the entire arm where the shot was given (up to about 3 in 100). Severe problems following Tdap (Unable to perform usual activities, required medical attention)  Swelling, severe pain, bleeding and redness in the arm where the shot was given (rare). A severe allergic reaction could occur after any vaccine (estimated less than 1 in a million doses). WHAT IF THERE IS A SERIOUS REACTION? What  should I look for?  Look for anything that concerns you, such as signs of a severe allergic reaction, very high fever, or behavior changes. Signs of a severe allergic reaction can include hives, swelling of the face and throat, difficulty breathing, a fast heartbeat, dizziness, and weakness. These would start a few minutes to a few hours after the vaccination. What should I do?  If you think it is a severe allergic reaction or other emergency that can't wait, call 9-1-1 or get the person to the nearest hospital. Otherwise, call your doctor.  Afterward, the reaction should be reported to the "Vaccine Adverse Event Reporting System" (VAERS). Your doctor might file this report, or you can do it yourself through the VAERS web site at www.vaers.hhs.gov, or by calling 1-800-822-7967. VAERS is only for reporting reactions. They do not give   medical advice.  THE NATIONAL VACCINE INJURY COMPENSATION PROGRAM The National Vaccine Injury Compensation Program (VICP) is a federal program that was created to compensate people who may have been injured by certain vaccines. Persons who believe they may have been injured by a vaccine can learn about the program and about filing a claim by calling 1-800-338-2382 or visiting the VICP website at www.hrsa.gov/vaccinecompensation. HOW CAN I LEARN MORE?  Ask your doctor.  Call your local or state health department.  Contact the Centers for Disease Control and Prevention (CDC):  Call 1-800-232-4636 or visit CDC's website at www.cdc.gov/vaccines CDC Tdap Vaccine VIS (02/03/12) Document Released: 03/14/2012 Document Reviewed: 03/14/2012 ExitCare Patient Information 2013 [employer].  

## 2013-01-02 ENCOUNTER — Other Ambulatory Visit: Payer: Self-pay | Admitting: Gynecology

## 2013-01-02 DIAGNOSIS — Z1231 Encounter for screening mammogram for malignant neoplasm of breast: Secondary | ICD-10-CM

## 2013-01-02 DIAGNOSIS — S82899A Other fracture of unspecified lower leg, initial encounter for closed fracture: Secondary | ICD-10-CM | POA: Diagnosis not present

## 2013-01-02 DIAGNOSIS — R269 Unspecified abnormalities of gait and mobility: Secondary | ICD-10-CM | POA: Diagnosis not present

## 2013-01-02 DIAGNOSIS — M25569 Pain in unspecified knee: Secondary | ICD-10-CM | POA: Diagnosis not present

## 2013-01-02 LAB — VITAMIN D 25 HYDROXY (VIT D DEFICIENCY, FRACTURES): Vit D, 25-Hydroxy: 40 ng/mL (ref 30–89)

## 2013-01-05 DIAGNOSIS — M25569 Pain in unspecified knee: Secondary | ICD-10-CM | POA: Diagnosis not present

## 2013-01-05 DIAGNOSIS — S82899A Other fracture of unspecified lower leg, initial encounter for closed fracture: Secondary | ICD-10-CM | POA: Diagnosis not present

## 2013-01-05 DIAGNOSIS — R269 Unspecified abnormalities of gait and mobility: Secondary | ICD-10-CM | POA: Diagnosis not present

## 2013-01-09 ENCOUNTER — Other Ambulatory Visit: Payer: Self-pay | Admitting: Gynecology

## 2013-01-09 DIAGNOSIS — E65 Localized adiposity: Secondary | ICD-10-CM

## 2013-01-09 DIAGNOSIS — N83339 Acquired atrophy of ovary and fallopian tube, unspecified side: Secondary | ICD-10-CM

## 2013-01-11 DIAGNOSIS — M25569 Pain in unspecified knee: Secondary | ICD-10-CM | POA: Diagnosis not present

## 2013-01-11 DIAGNOSIS — R269 Unspecified abnormalities of gait and mobility: Secondary | ICD-10-CM | POA: Diagnosis not present

## 2013-01-11 DIAGNOSIS — S82899A Other fracture of unspecified lower leg, initial encounter for closed fracture: Secondary | ICD-10-CM | POA: Diagnosis not present

## 2013-01-16 ENCOUNTER — Ambulatory Visit (HOSPITAL_COMMUNITY)
Admission: RE | Admit: 2013-01-16 | Discharge: 2013-01-16 | Disposition: A | Discharge status: Home / Self Care | Payer: Medicare Other | Source: Ambulatory Visit | Attending: Gynecology | Admitting: Gynecology

## 2013-01-16 DIAGNOSIS — M25569 Pain in unspecified knee: Secondary | ICD-10-CM | POA: Diagnosis not present

## 2013-01-16 DIAGNOSIS — Z1231 Encounter for screening mammogram for malignant neoplasm of breast: Secondary | ICD-10-CM | POA: Diagnosis not present

## 2013-01-16 DIAGNOSIS — R269 Unspecified abnormalities of gait and mobility: Secondary | ICD-10-CM | POA: Diagnosis not present

## 2013-01-16 DIAGNOSIS — S82899A Other fracture of unspecified lower leg, initial encounter for closed fracture: Secondary | ICD-10-CM | POA: Diagnosis not present

## 2013-01-17 ENCOUNTER — Ambulatory Visit (INDEPENDENT_AMBULATORY_CARE_PROVIDER_SITE_OTHER): Payer: Medicare Other

## 2013-01-17 ENCOUNTER — Ambulatory Visit (INDEPENDENT_AMBULATORY_CARE_PROVIDER_SITE_OTHER): Payer: Medicare Other | Admitting: Gynecology

## 2013-01-17 DIAGNOSIS — N942 Vaginismus: Secondary | ICD-10-CM

## 2013-01-17 DIAGNOSIS — E669 Obesity, unspecified: Secondary | ICD-10-CM

## 2013-01-17 DIAGNOSIS — N83339 Acquired atrophy of ovary and fallopian tube, unspecified side: Secondary | ICD-10-CM

## 2013-01-17 DIAGNOSIS — E65 Localized adiposity: Secondary | ICD-10-CM

## 2013-01-17 DIAGNOSIS — L909 Atrophic disorder of skin, unspecified: Secondary | ICD-10-CM

## 2013-01-17 DIAGNOSIS — L919 Hypertrophic disorder of the skin, unspecified: Secondary | ICD-10-CM | POA: Diagnosis not present

## 2013-01-17 NOTE — Progress Notes (Signed)
Patient presented to the office today to discuss her pelvic ultrasound. Patient was seen as a new patient on 01/01/2013. Due to patient's BMI of 46.25 (weight 261 pounds) her pelvic exam was somewhat limited and she was asked to come in today for further assessment. Of note her recent vitamin D level was normal.  Ultrasound: Uterus and cervix absent from previous hysterectomy. Both right and left ovary were both atrophic.  Patient was reassured the findings of the ultrasound and her vitamin D level. Her primary physician has been drawn her lab work. She still in the process of obtaining shingles vaccine and Tdap vaccine. We will otherwise see her in one year or when necessary. Last Pap smear was normal 2012.

## 2013-01-18 DIAGNOSIS — S82899A Other fracture of unspecified lower leg, initial encounter for closed fracture: Secondary | ICD-10-CM | POA: Diagnosis not present

## 2013-01-18 DIAGNOSIS — R269 Unspecified abnormalities of gait and mobility: Secondary | ICD-10-CM | POA: Diagnosis not present

## 2013-01-18 DIAGNOSIS — M25569 Pain in unspecified knee: Secondary | ICD-10-CM | POA: Diagnosis not present

## 2013-01-25 DIAGNOSIS — M25569 Pain in unspecified knee: Secondary | ICD-10-CM | POA: Diagnosis not present

## 2013-01-25 DIAGNOSIS — R269 Unspecified abnormalities of gait and mobility: Secondary | ICD-10-CM | POA: Diagnosis not present

## 2013-01-25 DIAGNOSIS — S82899A Other fracture of unspecified lower leg, initial encounter for closed fracture: Secondary | ICD-10-CM | POA: Diagnosis not present

## 2013-01-26 DIAGNOSIS — I4891 Unspecified atrial fibrillation: Secondary | ICD-10-CM | POA: Diagnosis not present

## 2013-01-26 DIAGNOSIS — Z7901 Long term (current) use of anticoagulants: Secondary | ICD-10-CM | POA: Diagnosis not present

## 2013-01-30 DIAGNOSIS — M25569 Pain in unspecified knee: Secondary | ICD-10-CM | POA: Diagnosis not present

## 2013-01-30 DIAGNOSIS — R269 Unspecified abnormalities of gait and mobility: Secondary | ICD-10-CM | POA: Diagnosis not present

## 2013-01-30 DIAGNOSIS — S82899A Other fracture of unspecified lower leg, initial encounter for closed fracture: Secondary | ICD-10-CM | POA: Diagnosis not present

## 2013-02-01 ENCOUNTER — Ambulatory Visit (INDEPENDENT_AMBULATORY_CARE_PROVIDER_SITE_OTHER): Payer: Medicare Other

## 2013-02-01 DIAGNOSIS — M949 Disorder of cartilage, unspecified: Secondary | ICD-10-CM | POA: Diagnosis not present

## 2013-02-01 DIAGNOSIS — M899 Disorder of bone, unspecified: Secondary | ICD-10-CM | POA: Diagnosis not present

## 2013-02-01 DIAGNOSIS — Z780 Asymptomatic menopausal state: Secondary | ICD-10-CM

## 2013-02-01 DIAGNOSIS — M8580 Other specified disorders of bone density and structure, unspecified site: Secondary | ICD-10-CM

## 2013-02-23 ENCOUNTER — Ambulatory Visit: Payer: Medicare Other | Admitting: Pharmacist Clinician (PhC)/ Clinical Pharmacy Specialist

## 2013-02-28 ENCOUNTER — Encounter (HOSPITAL_COMMUNITY): Payer: Self-pay | Admitting: Cardiovascular Disease

## 2013-02-28 ENCOUNTER — Other Ambulatory Visit (HOSPITAL_COMMUNITY): Payer: Self-pay | Admitting: Cardiovascular Disease

## 2013-02-28 ENCOUNTER — Ambulatory Visit (INDEPENDENT_AMBULATORY_CARE_PROVIDER_SITE_OTHER): Payer: Medicare Other | Admitting: Pharmacist Clinician (PhC)/ Clinical Pharmacy Specialist

## 2013-02-28 VITALS — BP 134/70 | HR 64

## 2013-02-28 DIAGNOSIS — I480 Paroxysmal atrial fibrillation: Secondary | ICD-10-CM

## 2013-02-28 DIAGNOSIS — I4891 Unspecified atrial fibrillation: Secondary | ICD-10-CM

## 2013-02-28 DIAGNOSIS — Z7901 Long term (current) use of anticoagulants: Secondary | ICD-10-CM | POA: Diagnosis not present

## 2013-02-28 DIAGNOSIS — I739 Peripheral vascular disease, unspecified: Secondary | ICD-10-CM

## 2013-03-14 ENCOUNTER — Other Ambulatory Visit: Payer: Self-pay | Admitting: *Deleted

## 2013-03-14 ENCOUNTER — Other Ambulatory Visit: Payer: Self-pay | Admitting: Pharmacist Clinician (PhC)/ Clinical Pharmacy Specialist

## 2013-03-14 MED ORDER — WARFARIN SODIUM 5 MG PO TABS: ORAL_TABLET | ORAL | Status: DC

## 2013-03-15 ENCOUNTER — Ambulatory Visit (HOSPITAL_COMMUNITY)
Admission: RE | Admit: 2013-03-15 | Discharge: 2013-03-15 | Disposition: A | Discharge status: Home / Self Care | Payer: Medicare Other | Source: Ambulatory Visit | Attending: Cardiovascular Disease | Admitting: Cardiovascular Disease

## 2013-03-15 DIAGNOSIS — I70219 Atherosclerosis of native arteries of extremities with intermittent claudication, unspecified extremity: Secondary | ICD-10-CM | POA: Diagnosis not present

## 2013-03-15 DIAGNOSIS — I739 Peripheral vascular disease, unspecified: Secondary | ICD-10-CM | POA: Diagnosis not present

## 2013-03-15 NOTE — Progress Notes (Signed)
Arterial Duplex Bilateral Lower Completed. Marilynne Halsted, RDMS, RVT

## 2013-03-27 ENCOUNTER — Encounter: Payer: Self-pay | Admitting: *Deleted

## 2013-03-28 ENCOUNTER — Ambulatory Visit: Payer: Medicare Other | Admitting: Pharmacist Clinician (PhC)/ Clinical Pharmacy Specialist

## 2013-03-29 ENCOUNTER — Encounter: Payer: Self-pay | Admitting: Cardiovascular Disease

## 2013-04-02 ENCOUNTER — Ambulatory Visit: Payer: Medicare Other | Admitting: Pharmacist Clinician (PhC)/ Clinical Pharmacy Specialist

## 2013-04-02 ENCOUNTER — Ambulatory Visit: Payer: Medicare Other | Admitting: Cardiology

## 2013-04-10 ENCOUNTER — Encounter: Payer: Self-pay | Admitting: Cardiology

## 2013-04-10 ENCOUNTER — Ambulatory Visit (INDEPENDENT_AMBULATORY_CARE_PROVIDER_SITE_OTHER): Payer: Medicare Other | Admitting: Cardiology

## 2013-04-10 ENCOUNTER — Ambulatory Visit (INDEPENDENT_AMBULATORY_CARE_PROVIDER_SITE_OTHER): Payer: Medicare Other | Admitting: Pharmacist Clinician (PhC)/ Clinical Pharmacy Specialist

## 2013-04-10 VITALS — BP 116/62 | HR 53 | Ht 63.0 in | Wt 266.3 lb

## 2013-04-10 DIAGNOSIS — Z7901 Long term (current) use of anticoagulants: Secondary | ICD-10-CM

## 2013-04-10 DIAGNOSIS — I480 Paroxysmal atrial fibrillation: Secondary | ICD-10-CM

## 2013-04-10 DIAGNOSIS — M1710 Unilateral primary osteoarthritis, unspecified knee: Secondary | ICD-10-CM | POA: Insufficient documentation

## 2013-04-10 DIAGNOSIS — I4891 Unspecified atrial fibrillation: Secondary | ICD-10-CM

## 2013-04-10 DIAGNOSIS — M179 Osteoarthritis of knee, unspecified: Secondary | ICD-10-CM

## 2013-04-10 DIAGNOSIS — I2510 Atherosclerotic heart disease of native coronary artery without angina pectoris: Secondary | ICD-10-CM

## 2013-04-10 DIAGNOSIS — IMO0002 Reserved for concepts with insufficient information to code with codable children: Secondary | ICD-10-CM

## 2013-04-10 DIAGNOSIS — I739 Peripheral vascular disease, unspecified: Secondary | ICD-10-CM | POA: Diagnosis not present

## 2013-04-10 LAB — POCT INR: INR: 3.6

## 2013-04-10 NOTE — Assessment & Plan Note (Signed)
She has ben on Plavix and Coumadin since her PCI 04/07/12

## 2013-04-10 NOTE — Patient Instructions (Addendum)
Your physician has recommended you make the following change in your medication: STOP PLAVIX. START taking 81mg  Aspirin once daily   Your physician wants you to follow-up in: 6 months with Dr. Royann Shivers. You will receive a reminder letter in the mail two months in advance. If you don't receive a letter, please call our office to schedule the follow-up appointment.

## 2013-04-10 NOTE — Progress Notes (Signed)
04/10/2013 [name]   1951-06-15  [id number]  Primary Physicia Brandi Fick, MD Primary Cardiologist: Dr [name]  HPI:  62 y/o followed by Dr [name] with a history of CAD, PVD, DM, obesity, HTN, and DJD (knee). She has an LAD DES placed 04/07/12. She was admitted in Feb 2014 with palpations and chest pain. She had documented PAF and was put on Coumadin. Myoview was intimally read as abnormal but this was over read by Dr [name] who felt the scan was low risk.           She is in the office today for a follow up visit. She denies chest pain or palpitations. Her main complaint is her Rt knee which constantly hurts her. She is followed by Dr [name]. She has had two prior knee replacements and apparently there are no plans for a third. She also mentioned that Dr [name] mentioned stopping her Plavix after a year (she has been on Plavix and Coumadin).    Current Outpatient Prescriptions  Medication Sig Dispense Refill  . albuterol (PROVENTIL HFA;VENTOLIN HFA) 108 (90 BASE) MCG/ACT inhaler Inhale 1-2 puffs into the lungs every 6 (six) hours as needed for wheezing.  1 Inhaler  0  . cholecalciferol (VITAMIN D) 1000 UNITS tablet Take 1,000 Units by mouth at bedtime.       . clopidogrel (PLAVIX) 75 MG tablet Take 75 mg by mouth every morning.       Marland Kitchen ibuprofen (ADVIL,MOTRIN) 200 MG tablet Take 200-400 mg by mouth every 6 (six) hours as needed for pain or fever.      . insulin regular (NOVOLIN R,HUMULIN R) 100 units/mL injection Inject 5 Units into the skin 3 (three) times daily before meals.      Marland Kitchen lisinopril-hydrochlorothiazide (PRINZIDE,ZESTORETIC) 20-25 MG per tablet Take 1 tablet by mouth at bedtime.       . metoCLOPramide (REGLAN) 10 MG tablet Take 10 mg by mouth 4 (four) times daily as needed. Nausea      . metoprolol (LOPRESSOR) 50 MG tablet Take 1 tablet (50 mg total) by mouth 2 (two) times daily.  60 tablet  5  . oxyCODONE-acetaminophen (PERCOCET/ROXICET) 5-325 MG per tablet Take 1  tablet by mouth every 6 (six) hours as needed for pain.      . pantoprazole (PROTONIX) 40 MG tablet Take 40 mg by mouth every morning.       . simvastatin (ZOCOR) 40 MG tablet Take 40 mg by mouth at bedtime.       Marland Kitchen warfarin (COUMADIN) 5 MG tablet Take 1-1.5 tablets by mouth daily as directed  45 tablet  3  . nitroGLYCERIN (NITROSTAT) 0.4 MG SL tablet Place 1 tablet (0.4 mg total) under the tongue every 5 (five) minutes as needed for chest pain.  25 tablet  5   No current facility-administered medications for this visit.    Allergies  Allergen Reactions  . Levofloxacin Itching  . Morphine And Related Itching    History   Social History  . Marital Status: Single    Spouse Name: N/A    Number of Children: N/A  . Years of Education: N/A   Occupational History  . Not on file.   Social History Main Topics  . Smoking status: Former Smoker -- 1.00 packs/day for 20 years    Types: Cigarettes    Quit date: 09/27/1984  . Smokeless tobacco: Never Used  . Alcohol Use: No  . Drug Use: No  . Sexually Active: Not Currently  Birth Control/ Protection: Post-menopausal   Other Topics Concern  . Not on file   Social History Narrative  . No narrative on file     Review of Systems: General: negative for chills, fever, night sweats or weight changes.  Cardiovascular: negative for chest pain, dyspnea on exertion, edema, orthopnea, palpitations, paroxysmal nocturnal dyspnea or shortness of breath Dermatological: negative for rash Respiratory: negative for cough or wheezing Urologic: negative for hematuria Abdominal: negative for nausea, vomiting, diarrhea, bright red blood per rectum, melena, or hematemesis Neurologic: negative for visual changes, syncope, or dizziness All other systems reviewed and are otherwise negative except as noted above.    Blood pressure 116/62, pulse 53, height 5\' 3"  (1.6 m), weight 266 lb 4.8 oz (120.793 kg).  General appearance: alert, cooperative, no  distress and morbidly obese Lungs: clear to auscultation bilaterally Heart: regular rate and rhythm and 1/6 systolic murmur LSB and AOV area  EKG  EKG: normal EKG, normal sinus rhythm, unchanged from previous tracings, sinus bradycardia.  ASSESSMENT AND PLAN:   CAD, LAD DES 04/07/12 - Low risk Myoview March 2014 No angina  PAF,  NSR/SB today in the office  Chronic anticoagulation She has ben on Plavix and Coumadin since her PCI 04/07/12  PVD (peripheral vascular disease), Lt SFA PTA Aug 2012 Recent ABI's 1.0 Rt, 0.93 Lt 03/26/13. No claudication  DJD (degenerative joint disease) of knee Two no plans for surgery per pt. She has had prior knee replacements '04 and '07. She has an unstable knee. She is followed by Dr [name]  I stopped [name]'s Plavix and asked her to start an 81 mg ASA daily. She will follow up with Dr [name] in 6 months. She is bradycardic but has no symptoms of fatigue, syncope, or dizziness so I did not change her beta blocker dose.    [facility] 04/10/2013 2:44 PM

## 2013-04-10 NOTE — Assessment & Plan Note (Signed)
No angina 

## 2013-04-10 NOTE — Assessment & Plan Note (Signed)
NSR/SB today in the office

## 2013-04-10 NOTE — Assessment & Plan Note (Signed)
Two no plans for surgery per pt. She has had prior knee replacements '04 and '07. She has an unstable knee. She is followed by Dr August Saucer

## 2013-04-10 NOTE — Assessment & Plan Note (Signed)
Recent ABI's 1.0 Rt, 0.93 Lt 03/26/13. No claudication

## 2013-04-11 DIAGNOSIS — I10 Essential (primary) hypertension: Secondary | ICD-10-CM | POA: Diagnosis not present

## 2013-04-11 DIAGNOSIS — E1065 Type 1 diabetes mellitus with hyperglycemia: Secondary | ICD-10-CM | POA: Diagnosis not present

## 2013-04-11 DIAGNOSIS — E782 Mixed hyperlipidemia: Secondary | ICD-10-CM | POA: Diagnosis not present

## 2013-04-25 ENCOUNTER — Other Ambulatory Visit: Payer: Self-pay | Admitting: Cardiovascular Disease

## 2013-04-25 NOTE — Telephone Encounter (Signed)
Rx was sent to pharmacy electronically. 

## 2013-04-26 ENCOUNTER — Ambulatory Visit (INDEPENDENT_AMBULATORY_CARE_PROVIDER_SITE_OTHER): Payer: Medicare Other | Admitting: Pharmacist Clinician (PhC)/ Clinical Pharmacy Specialist

## 2013-04-26 DIAGNOSIS — I4891 Unspecified atrial fibrillation: Secondary | ICD-10-CM | POA: Diagnosis not present

## 2013-04-26 DIAGNOSIS — E1065 Type 1 diabetes mellitus with hyperglycemia: Secondary | ICD-10-CM | POA: Diagnosis not present

## 2013-04-26 DIAGNOSIS — I480 Paroxysmal atrial fibrillation: Secondary | ICD-10-CM

## 2013-04-26 DIAGNOSIS — E782 Mixed hyperlipidemia: Secondary | ICD-10-CM | POA: Diagnosis not present

## 2013-04-26 DIAGNOSIS — I10 Essential (primary) hypertension: Secondary | ICD-10-CM | POA: Diagnosis not present

## 2013-04-26 DIAGNOSIS — Z7901 Long term (current) use of anticoagulants: Secondary | ICD-10-CM | POA: Diagnosis not present

## 2013-04-26 LAB — POCT INR: INR: 2.8

## 2013-05-17 ENCOUNTER — Ambulatory Visit: Payer: Medicare Other | Admitting: Pharmacist Clinician (PhC)/ Clinical Pharmacy Specialist

## 2013-05-23 ENCOUNTER — Ambulatory Visit: Payer: Medicare Other | Admitting: Pharmacist Clinician (PhC)/ Clinical Pharmacy Specialist

## 2013-05-29 ENCOUNTER — Other Ambulatory Visit: Payer: Self-pay | Admitting: *Deleted

## 2013-05-29 MED ORDER — WARFARIN SODIUM 7.5 MG PO TABS: ORAL_TABLET | ORAL | Status: DC

## 2013-05-29 NOTE — Telephone Encounter (Signed)
Rx was sent to pharmacy electronically. 

## 2013-06-11 ENCOUNTER — Ambulatory Visit (INDEPENDENT_AMBULATORY_CARE_PROVIDER_SITE_OTHER): Payer: Medicare Other | Admitting: Pharmacist Clinician (PhC)/ Clinical Pharmacy Specialist

## 2013-06-11 VITALS — BP 122/64 | HR 60

## 2013-06-11 DIAGNOSIS — I4891 Unspecified atrial fibrillation: Secondary | ICD-10-CM | POA: Diagnosis not present

## 2013-06-11 DIAGNOSIS — Z7901 Long term (current) use of anticoagulants: Secondary | ICD-10-CM | POA: Diagnosis not present

## 2013-06-11 DIAGNOSIS — I480 Paroxysmal atrial fibrillation: Secondary | ICD-10-CM

## 2013-06-14 DIAGNOSIS — K3184 Gastroparesis: Secondary | ICD-10-CM | POA: Diagnosis not present

## 2013-06-14 DIAGNOSIS — K449 Diaphragmatic hernia without obstruction or gangrene: Secondary | ICD-10-CM | POA: Diagnosis not present

## 2013-06-14 DIAGNOSIS — K219 Gastro-esophageal reflux disease without esophagitis: Secondary | ICD-10-CM | POA: Diagnosis not present

## 2013-06-22 ENCOUNTER — Other Ambulatory Visit: Payer: Self-pay | Admitting: Cardiovascular Disease

## 2013-06-22 NOTE — Telephone Encounter (Signed)
Rx was sent to pharmacy electronically. 

## 2013-06-28 ENCOUNTER — Ambulatory Visit (INDEPENDENT_AMBULATORY_CARE_PROVIDER_SITE_OTHER): Payer: Medicare Other | Admitting: Cardiovascular Disease

## 2013-06-28 ENCOUNTER — Encounter: Payer: Self-pay | Admitting: Cardiovascular Disease

## 2013-06-28 ENCOUNTER — Ambulatory Visit (INDEPENDENT_AMBULATORY_CARE_PROVIDER_SITE_OTHER): Payer: Medicare Other | Admitting: Pharmacist Clinician (PhC)/ Clinical Pharmacy Specialist

## 2013-06-28 VITALS — BP 130/78 | HR 61 | Ht 63.0 in | Wt 260.9 lb

## 2013-06-28 DIAGNOSIS — E119 Type 2 diabetes mellitus without complications: Secondary | ICD-10-CM

## 2013-06-28 DIAGNOSIS — Z7901 Long term (current) use of anticoagulants: Secondary | ICD-10-CM | POA: Diagnosis not present

## 2013-06-28 DIAGNOSIS — I2510 Atherosclerotic heart disease of native coronary artery without angina pectoris: Secondary | ICD-10-CM

## 2013-06-28 DIAGNOSIS — I739 Peripheral vascular disease, unspecified: Secondary | ICD-10-CM

## 2013-06-28 DIAGNOSIS — I4891 Unspecified atrial fibrillation: Secondary | ICD-10-CM | POA: Diagnosis not present

## 2013-06-28 DIAGNOSIS — I255 Ischemic cardiomyopathy: Secondary | ICD-10-CM

## 2013-06-28 DIAGNOSIS — E785 Hyperlipidemia, unspecified: Secondary | ICD-10-CM

## 2013-06-28 DIAGNOSIS — I10 Essential (primary) hypertension: Secondary | ICD-10-CM

## 2013-06-28 DIAGNOSIS — I498 Other specified cardiac arrhythmias: Secondary | ICD-10-CM | POA: Diagnosis not present

## 2013-06-28 DIAGNOSIS — I2589 Other forms of chronic ischemic heart disease: Secondary | ICD-10-CM

## 2013-06-28 DIAGNOSIS — R001 Bradycardia, unspecified: Secondary | ICD-10-CM

## 2013-06-28 DIAGNOSIS — I480 Paroxysmal atrial fibrillation: Secondary | ICD-10-CM

## 2013-06-28 LAB — POCT INR: INR: 2.6

## 2013-06-28 MED ORDER — METOPROLOL TARTRATE 50 MG PO TABS: 50.0000 mg | ORAL_TABLET | Freq: Two times a day (BID) | ORAL | Status: DC

## 2013-06-28 NOTE — Patient Instructions (Addendum)
Your physician recommends that you schedule a follow-up appointment in: One year.  

## 2013-06-29 ENCOUNTER — Ambulatory Visit: Payer: Medicare Other | Admitting: Pharmacist Clinician (PhC)/ Clinical Pharmacy Specialist

## 2013-06-29 ENCOUNTER — Ambulatory Visit: Payer: Medicare Other | Admitting: Cardiovascular Disease

## 2013-07-01 NOTE — Assessment & Plan Note (Addendum)
No current problems with intermittent claudication. Last duplex ultrasound in June showed favorable findings with a right-sided ABI of 1.0, left-sided ABI 0.93

## 2013-07-01 NOTE — Assessment & Plan Note (Signed)
Her most recent lipid profile from a year ago shows a total cholesterol of 158, triglycerides 60, HDL 55, LDL 89, all generally in the favorable range. However since that time her hemoglobin A1c has deteriorated from 8.2% to 8.8%. I think she has had more recent lab tests performed by Dr. Nicholos Johns and we'll try to retreive those

## 2013-07-01 NOTE — Assessment & Plan Note (Signed)
Glycemic control is mediocre and is a major target for improvement and reduction in her risk of future coronary and peripheral vascular events.

## 2013-07-01 NOTE — Assessment & Plan Note (Addendum)
Maintaining sinus rhythm without any symptomatic recurrences. She should remain on lifelong warfarin anticoagulation. Reminded about the potential risk of GI bleeding with nonsteroidal anti-inflammatory drugs. She should use these sparingly.

## 2013-07-01 NOTE — Assessment & Plan Note (Signed)
No current angina or electrocardiographic abnormalities. She is still in the time range for in-stent restenosis is a possibility, although now less probable.

## 2013-07-01 NOTE — Progress Notes (Signed)
Patient ID: [name], female   DOB: 04-16-1951, 62 y.o.   MRN: [id number]        Reason for office visit Followup CAD, PAD, hyperlipidemia, hypertension, paroxysmal atrial fibrillation  [name] is feeling well. It has been 2 years since her left SFA angioplasty, 15 months since her non-ST segment elevation myocardial infarction treated with placement of a drug-eluting stent to the proximal LAD(when she also had atrial fibrillation), one year since her ankle fracture. There have been no clinical episodes of atrial fibrillation, heart failure and she has not had angina pectoris. Her only complaints today are musculoskeletal.   Allergies  Allergen Reactions  . Levofloxacin Itching  . Morphine And Related Itching    Current Outpatient Prescriptions  Medication Sig Dispense Refill  . aspirin EC 81 MG tablet Take 81 mg by mouth daily.      . cholecalciferol (VITAMIN D) 1000 UNITS tablet Take 1,000 Units by mouth at bedtime.       Marland Kitchen ibuprofen (ADVIL,MOTRIN) 200 MG tablet Take 200-400 mg by mouth every 6 (six) hours as needed for pain or fever.      . insulin regular (NOVOLIN R,HUMULIN R) 100 units/mL injection Inject 5 Units into the skin 3 (three) times daily before meals.      Marland Kitchen lisinopril-hydrochlorothiazide (PRINZIDE,ZESTORETIC) 20-25 MG per tablet Take 1 tablet by mouth at bedtime.       . metoCLOPramide (REGLAN) 10 MG tablet Take 10 mg by mouth 4 (four) times daily as needed. Nausea      . metoprolol (LOPRESSOR) 50 MG tablet Take 1 tablet (50 mg total) by mouth 2 (two) times daily.  60 tablet  5  . nitroGLYCERIN (NITROSTAT) 0.4 MG SL tablet Place 1 tablet (0.4 mg total) under the tongue every 5 (five) minutes as needed for chest pain.  25 tablet  5  . omeprazole (PRILOSEC) 40 MG capsule Take 40 mg by mouth daily.      Marland Kitchen oxyCODONE-acetaminophen (PERCOCET/ROXICET) 5-325 MG per tablet Take 1 tablet by mouth every 6 (six) hours as needed for pain.      . simvastatin (ZOCOR) 40 MG  tablet TAKE 1 TABLET BY MOUTH AT BEDTIME  30 tablet  9  . warfarin (COUMADIN) 7.5 MG tablet Take as directed per INR  30 tablet  1  . albuterol (PROVENTIL HFA;VENTOLIN HFA) 108 (90 BASE) MCG/ACT inhaler Inhale 1-2 puffs into the lungs every 6 (six) hours as needed for wheezing.  1 Inhaler  0   No current facility-administered medications for this visit.    Past Medical History  Diagnosis Date  . Hypertension   . Hypercholesteremia   . Cancer 5/10    [facility]   . Lung cancer   . Peripheral vascular disease 8/12    Lt SFA PTA  . Sleep apnea     " mild form ,does not use cpap "  . Diabetes mellitus     insulin dependent  . GERD (gastroesophageal reflux disease)   . H/O hiatal hernia   . Arthritis   . Non-STEMI (non-ST elevated myocardial infarction) 04/07/2012    See cath results below  . Presence of stent in LAD coronary artery 04/07/2012    Xience Expedition DES 2.75 mm x 18 mm (dilated to 3.0 mm)  . Paroxysmal atrial fibrillation 04/07/2012    On Warfarin  . Heart palpitations     monitor 04/19/12-05/10/12- frequent PAF with RVR- coumadin started    Past Surgical History  Procedure Laterality  Date  . Abdominal hysterectomy  1960  . Cholecystectomy    . Left knee replacement    . Lung surgery  5/10  . Percutaneous coronary angioplasty and stenting  04/07/2012    Xience Expedition DES 2.91mm x 18 mm (dilated to 3.0 mm) place to the prox LAD   . Orif ankle fracture  07/09/2012    Procedure: OPEN REDUCTION INTERNAL FIXATION (ORIF) ANKLE FRACTURE;  Surgeon: Cammy Copa, MD;  Location: [facility];  Service: Orthopedics;  Laterality: Left;  open reduction internal fixation trimalleolar ankle fracture medial malleolous fixation  . Cardiac catheterization  11/04/2008    patent RCA, LM, and Circ, nl EF  . Cardiac catheterization  02/20/2002    patent coronaries with the only abnormality being a smooth luminla irregularity in the mid intermediate ramus branch no felt to be  hemodynamically significant, nl LV  . Lower extremity angiogram  05/17/11    directional atherectomy to the prox L SFA using a LX Man TurboHawk, ballooned with a [employer] balloon   . Coronary angioplasty with stent placement  04/07/12    LAD    Family History  Problem Relation Age of Onset  . Hypertension Mother   . Coronary artery disease Brother   . Hypertension Sister     History   Social History  . Marital Status: Single    Spouse Name: N/A    Number of Children: N/A  . Years of Education: N/A   Occupational History  . Not on file.   Social History Main Topics  . Smoking status: Former Smoker -- 1.00 packs/day for 20 years    Types: Cigarettes    Quit date: 09/27/1984  . Smokeless tobacco: Never Used  . Alcohol Use: No  . Drug Use: No  . Sexual Activity: Not Currently    Birth Control/ Protection: Post-menopausal   Other Topics Concern  . Not on file   Social History Narrative  . No narrative on file    Review of systems: The patient specifically denies any chest pain at rest or with exertion, dyspnea at rest or with exertion, orthopnea, paroxysmal nocturnal dyspnea, syncope, palpitations, focal neurological deficits, intermittent claudication, lower extremity edema, unexplained weight gain, cough, hemoptysis or wheezing.  The patient also denies abdominal pain, nausea, vomiting, dysphagia, diarrhea, constipation, polyuria, polydipsia, dysuria, hematuria, frequency, urgency, abnormal bleeding or bruising, fever, chills, unexpected weight changes, mood swings, change in skin or hair texture, change in voice quality, auditory or visual problems, allergic reactions or rashes, new musculoskeletal complaints other than usual "aches and pains".   PHYSICAL EXAM BP 130/78  Pulse 61  Ht 5\' 3"  (1.6 m)  Wt 260 lb 14.4 oz (118.343 kg)  BMI 46.23 kg/m2  General: Alert, oriented x3, no distress Head: no evidence of trauma, PERRL, EOMI, no exophtalmos or lid lag, no  myxedema, no xanthelasma; normal ears, nose and oropharynx Neck: normal jugular venous pulsations and no hepatojugular reflux; brisk carotid pulses without delay and no carotid bruits Chest: clear to auscultation, no signs of consolidation by percussion or palpation, normal fremitus, symmetrical and full respiratory excursions Cardiovascular: normal position and quality of the apical impulse, regular rhythm, normal first and second heart sounds, no murmurs, rubs or gallops Abdomen: no tenderness or distention, no masses by palpation, no abnormal pulsatility or arterial bruits, normal bowel sounds, no hepatosplenomegaly Extremities: no clubbing, cyanosis or edema; 2+ radial, ulnar and brachial pulses bilaterally; 2+ right femoral, 1+ posterior tibial and 1+ dorsalis pedis pulses;  2+ left femoral, 2+ posterior tibial and 1+ dorsalis pedis pulses; no subclavian or femoral bruits Neurological: grossly nonfocal   EKG: Sinus rhythm, borderline criteria for LVH with nonspecific ST segment abnormalities  Lipid Panel     Component Value Date/Time   CHOL 134 09/30/2010 2026   TRIG 70 09/30/2010 2026   HDL 50 09/30/2010 2026   CHOLHDL 2.7 Ratio 09/30/2010 2026   VLDL 14 09/30/2010 2026   LDLCALC 70 09/30/2010 2026    BMET    Component Value Date/Time   NA 136 11/23/2012 0430   NA 139 09/12/2012 0959   NA 134 09/14/2010 0939   K 3.8 11/23/2012 0430   K 4.0 09/12/2012 0959   K 3.9 09/14/2010 0939   CL 100 11/23/2012 0430   CL 99 09/12/2012 0959   CL 96* 09/14/2010 0939   CO2 30 11/23/2012 0430   CO2 30* 09/12/2012 0959   CO2 31 09/14/2010 0939   GLUCOSE 220* 11/23/2012 0430   GLUCOSE 217* 09/12/2012 0959   GLUCOSE 277* 09/14/2010 0939   BUN 14 11/23/2012 0430   BUN 9.0 09/12/2012 0959   BUN 14 09/14/2010 0939   CREATININE 0.62 11/23/2012 0430   CREATININE 0.9 09/12/2012 0959   CREATININE 0.7 09/14/2010 0939   CALCIUM 9.6 11/23/2012 0430   CALCIUM 9.9 09/12/2012 0959   CALCIUM 9.3 09/14/2010 0939    GFRNONAA >90 11/23/2012 0430   GFRAA >90 11/23/2012 0430     ASSESSMENT AND PLAN PVD (peripheral vascular disease), Lt SFA PTA Aug 2012 No current problems with intermittent claudication. Last duplex ultrasound in June showed favorable findings with a right-sided ABI of 1.0, left-sided ABI 0.93  CAD, LAD DES 04/07/12 - Low risk Myoview March 2014 No current angina or electrocardiographic abnormalities. She is still in the time range for in-stent restenosis is a possibility, although now less probable.  Paroxysmal atrial fibrillation Maintaining sinus rhythm without any symptomatic recurrences. She should remain on lifelong warfarin anticoagulation. Reminded about the potential risk of GI bleeding with nonsteroidal anti-inflammatory drugs. She should use these sparingly.  ICM, EF 40-45% cath 04/07/12- improved to 50-60% by echo Oct 2013 Does not have any signs or symptoms of congestive heart failure. NYHA class I, euvolemic.  DIABETES, TYPE 2 Glycemic control is mediocre and is a major target for improvement and reduction in her risk of future coronary and peripheral vascular events.  HYPERTENSION Well-controlled  Dyslipidemia Her most recent lipid profile from a year ago shows a total cholesterol of 158, triglycerides 60, HDL 55, LDL 89, all generally in the favorable range. However since that time her hemoglobin A1c has deteriorated from 8.2% to 8.8%. I think she has had more recent lab tests performed by Dr. Nicholos Johns and we'll try to retreive those  Orders Placed This Encounter  Procedures  . EKG 12-Lead   Meds ordered this encounter  Medications  . metoprolol (LOPRESSOR) 50 MG tablet    Sig: Take 1 tablet (50 mg total) by mouth 2 (two) times daily.    Dispense:  60 tablet    Refill:  5  . omeprazole (PRILOSEC) 40 MG capsule    Sig: Take 40 mg by mouth daily.    Junious Silk, MD, [facility] [phone] office [phone] pager

## 2013-07-01 NOTE — Assessment & Plan Note (Signed)
Well controlled 

## 2013-07-01 NOTE — Assessment & Plan Note (Signed)
Does not have any signs or symptoms of congestive heart failure. NYHA class I, euvolemic.

## 2013-07-08 NOTE — Progress Notes (Signed)
Patient ID: [name], female   DOB: [birth year], 62 y.o.   MRN: [id number]  duplicate

## 2013-07-19 ENCOUNTER — Ambulatory Visit: Payer: Medicare Other | Admitting: Pharmacist Clinician (PhC)/ Clinical Pharmacy Specialist

## 2013-07-20 ENCOUNTER — Ambulatory Visit (INDEPENDENT_AMBULATORY_CARE_PROVIDER_SITE_OTHER): Payer: Medicare Other | Admitting: Pharmacist Clinician (PhC)/ Clinical Pharmacy Specialist

## 2013-07-20 ENCOUNTER — Ambulatory Visit (INDEPENDENT_AMBULATORY_CARE_PROVIDER_SITE_OTHER): Payer: Medicare Other | Admitting: Cardiovascular Disease

## 2013-07-20 ENCOUNTER — Encounter: Payer: Self-pay | Admitting: Cardiovascular Disease

## 2013-07-20 VITALS — BP 130/80 | HR 60 | Ht 63.0 in | Wt 262.5 lb

## 2013-07-20 DIAGNOSIS — I739 Peripheral vascular disease, unspecified: Secondary | ICD-10-CM | POA: Diagnosis not present

## 2013-07-20 DIAGNOSIS — I480 Paroxysmal atrial fibrillation: Secondary | ICD-10-CM

## 2013-07-20 DIAGNOSIS — Z7901 Long term (current) use of anticoagulants: Secondary | ICD-10-CM

## 2013-07-20 DIAGNOSIS — I4891 Unspecified atrial fibrillation: Secondary | ICD-10-CM | POA: Diagnosis not present

## 2013-07-20 NOTE — Assessment & Plan Note (Signed)
Status post left proximal and mid SFA Turbo Hawk atherectomy for claudication with improved symptoms and Dopplers. She did have moderate segmental right SFA stenosis. Her recent arterial Dopplers performed 03/16/13 revealed a right ABI of 1 with mild right SFA disease and a left ABI of 0.93 with a high-frequency signal in the mid left SFA somewhat progressed compared to the prior study. The patient complains of right greater than left lower extremity discomfort lying and sitting but not walking suggesting pseudo-claudication.

## 2013-07-20 NOTE — Progress Notes (Signed)
07/20/2013 [name]   1951/07/02  [id number]  Primary Physician Georgianne Fick, MD Primary Cardiologist: Runell Gess MD Roseanne Reno   HPI:  [name] is a 62 year old severely overweight African female history of PAT and CAD. I stented her LAD 04/07/12 with a drug-eluting stent. Atherectomized her left SFA back in 2012. Her other problems include hypertension, hypokalemia, diabetes and paroxysmal atrial fibrillation.  She denies chest pain or shortness of breath. She does have right greater than left lower extremity discomfort when lying and sitting but not with walking.   Current Outpatient Prescriptions  Medication Sig Dispense Refill  . aspirin EC 81 MG tablet Take 81 mg by mouth daily.      . cholecalciferol (VITAMIN D) 1000 UNITS tablet Take 1,000 Units by mouth at bedtime.       Marland Kitchen ibuprofen (ADVIL,MOTRIN) 200 MG tablet Take 200-400 mg by mouth every 6 (six) hours as needed for pain or fever.      . insulin regular (NOVOLIN R,HUMULIN R) 100 units/mL injection Inject 5 Units into the skin 3 (three) times daily before meals.      Marland Kitchen lisinopril-hydrochlorothiazide (PRINZIDE,ZESTORETIC) 20-25 MG per tablet Take 1 tablet by mouth at bedtime.       . metoCLOPramide (REGLAN) 10 MG tablet Take 5-10 mg by mouth 2 (two) times daily. 5mg  in AM, 10mg  in PM Nausea      . metoprolol (LOPRESSOR) 50 MG tablet Take 1 tablet (50 mg total) by mouth 2 (two) times daily.  60 tablet  5  . nitroGLYCERIN (NITROSTAT) 0.4 MG SL tablet Place 1 tablet (0.4 mg total) under the tongue every 5 (five) minutes as needed for chest pain.  25 tablet  5  . omeprazole (PRILOSEC) 40 MG capsule Take 40 mg by mouth daily.      . simvastatin (ZOCOR) 40 MG tablet TAKE 1 TABLET BY MOUTH AT BEDTIME  30 tablet  9  . warfarin (COUMADIN) 7.5 MG tablet Take as directed per INR  30 tablet  1  . albuterol (PROVENTIL HFA;VENTOLIN HFA) 108 (90 BASE) MCG/ACT inhaler Inhale 1-2 puffs into the lungs  every 6 (six) hours as needed for wheezing.  1 Inhaler  0   No current facility-administered medications for this visit.    Allergies  Allergen Reactions  . Levofloxacin Itching  . Morphine And Related Itching    History   Social History  . Marital Status: Single    Spouse Name: N/A    Number of Children: N/A  . Years of Education: N/A   Occupational History  . Not on file.   Social History Main Topics  . Smoking status: Former Smoker -- 1.00 packs/day for 20 years    Types: Cigarettes    Quit date: 09/27/1984  . Smokeless tobacco: Never Used  . Alcohol Use: No  . Drug Use: No  . Sexual Activity: Not Currently    Birth Control/ Protection: Post-menopausal   Other Topics Concern  . Not on file   Social History Narrative  . No narrative on file     Review of Systems: General: negative for chills, fever, night sweats or weight changes.  Cardiovascular: negative for chest pain, dyspnea on exertion, edema, orthopnea, palpitations, paroxysmal nocturnal dyspnea or shortness of breath Dermatological: negative for rash Respiratory: negative for cough or wheezing Urologic: negative for hematuria Abdominal: negative for nausea, vomiting, diarrhea, bright red blood per rectum, melena, or hematemesis Neurologic: negative for visual changes, syncope, or dizziness  All other systems reviewed and are otherwise negative except as noted above.    Blood pressure 130/80, pulse 60, height 5\' 3"  (1.6 m), weight 262 lb 8 oz (119.069 kg).  General appearance: alert and no distress Neck: no adenopathy, no carotid bruit, no JVD, supple, symmetrical, trachea midline and thyroid not enlarged, symmetric, no tenderness/mass/nodules Lungs: clear to auscultation bilaterally Heart: regular rate and rhythm, S1, S2 normal, no murmur, click, rub or gallop Extremities: extremities normal, atraumatic, no cyanosis or edema  EKG not performed today  ASSESSMENT AND PLAN:   PVD (peripheral  vascular disease), Lt SFA PTA Aug 2012 Status post left proximal and mid SFA Turbo Hawk atherectomy for claudication with improved symptoms and Dopplers. She did have moderate segmental right SFA stenosis. Her recent arterial Dopplers performed 03/16/13 revealed a right ABI of 1 with mild right SFA disease and a left ABI of 0.93 with a high-frequency signal in the mid left SFA somewhat progressed compared to the prior study. The patient complains of right greater than left lower extremity discomfort lying and sitting but not walking suggesting pseudo-claudication.      Runell Gess MD FACP,FACC,FAHA, [facility] 07/20/2013 12:49 PM

## 2013-07-20 NOTE — Patient Instructions (Signed)
Follow up with Dr [name] as needed and follow up with Dr Royann Shivers as previously scheduled.

## 2013-07-30 ENCOUNTER — Other Ambulatory Visit: Payer: Self-pay | Admitting: Cardiovascular Disease

## 2013-07-30 DIAGNOSIS — E11329 Type 2 diabetes mellitus with mild nonproliferative diabetic retinopathy without macular edema: Secondary | ICD-10-CM | POA: Diagnosis not present

## 2013-07-30 DIAGNOSIS — Z961 Presence of intraocular lens: Secondary | ICD-10-CM | POA: Diagnosis not present

## 2013-07-30 DIAGNOSIS — E1139 Type 2 diabetes mellitus with other diabetic ophthalmic complication: Secondary | ICD-10-CM | POA: Diagnosis not present

## 2013-07-30 DIAGNOSIS — H52209 Unspecified astigmatism, unspecified eye: Secondary | ICD-10-CM | POA: Diagnosis not present

## 2013-08-02 DIAGNOSIS — E1065 Type 1 diabetes mellitus with hyperglycemia: Secondary | ICD-10-CM | POA: Diagnosis not present

## 2013-08-02 DIAGNOSIS — E782 Mixed hyperlipidemia: Secondary | ICD-10-CM | POA: Diagnosis not present

## 2013-08-09 DIAGNOSIS — K2100 Gastro-esophageal reflux disease with esophagitis, without bleeding: Secondary | ICD-10-CM | POA: Diagnosis not present

## 2013-08-09 DIAGNOSIS — I4891 Unspecified atrial fibrillation: Secondary | ICD-10-CM | POA: Diagnosis not present

## 2013-08-09 DIAGNOSIS — I10 Essential (primary) hypertension: Secondary | ICD-10-CM | POA: Diagnosis not present

## 2013-08-09 DIAGNOSIS — E1065 Type 1 diabetes mellitus with hyperglycemia: Secondary | ICD-10-CM | POA: Diagnosis not present

## 2013-08-21 ENCOUNTER — Ambulatory Visit (INDEPENDENT_AMBULATORY_CARE_PROVIDER_SITE_OTHER): Payer: Medicare Other | Admitting: Pharmacist Clinician (PhC)/ Clinical Pharmacy Specialist

## 2013-08-21 VITALS — BP 120/66 | HR 56

## 2013-08-21 DIAGNOSIS — I4891 Unspecified atrial fibrillation: Secondary | ICD-10-CM | POA: Diagnosis not present

## 2013-08-21 DIAGNOSIS — Z7901 Long term (current) use of anticoagulants: Secondary | ICD-10-CM | POA: Diagnosis not present

## 2013-08-21 DIAGNOSIS — I480 Paroxysmal atrial fibrillation: Secondary | ICD-10-CM

## 2013-08-21 LAB — POCT INR: INR: 2.1

## 2013-09-05 ENCOUNTER — Other Ambulatory Visit (HOSPITAL_COMMUNITY): Payer: Self-pay | Admitting: Orthopedic Surgery

## 2013-09-05 DIAGNOSIS — M25569 Pain in unspecified knee: Secondary | ICD-10-CM | POA: Diagnosis not present

## 2013-09-05 DIAGNOSIS — Z96652 Presence of left artificial knee joint: Secondary | ICD-10-CM

## 2013-09-12 ENCOUNTER — Ambulatory Visit (HOSPITAL_COMMUNITY)
Admission: RE | Admit: 2013-09-12 | Discharge: 2013-09-12 | Disposition: A | Discharge status: Home / Self Care | Payer: Medicare Other | Source: Ambulatory Visit | Attending: Internal Medicine | Admitting: Internal Medicine

## 2013-09-12 ENCOUNTER — Other Ambulatory Visit (HOSPITAL_BASED_OUTPATIENT_CLINIC_OR_DEPARTMENT_OTHER): Payer: Medicare Other

## 2013-09-12 DIAGNOSIS — C3410 Malignant neoplasm of upper lobe, unspecified bronchus or lung: Secondary | ICD-10-CM | POA: Diagnosis not present

## 2013-09-12 DIAGNOSIS — R918 Other nonspecific abnormal finding of lung field: Secondary | ICD-10-CM | POA: Diagnosis not present

## 2013-09-12 DIAGNOSIS — K449 Diaphragmatic hernia without obstruction or gangrene: Secondary | ICD-10-CM | POA: Insufficient documentation

## 2013-09-12 DIAGNOSIS — C3490 Malignant neoplasm of unspecified part of unspecified bronchus or lung: Secondary | ICD-10-CM

## 2013-09-12 LAB — COMPREHENSIVE METABOLIC PANEL (CC13)
ALT: 15 U/L (ref 0–55)
AST: 20 U/L (ref 5–34)
Alkaline Phosphatase: 98 U/L (ref 40–150)
Creatinine: 0.9 mg/dL (ref 0.6–1.1)
Glucose: 230 mg/dl — ABNORMAL HIGH (ref 70–140)
Potassium: 3.8 mEq/L (ref 3.5–5.1)
Sodium: 139 mEq/L (ref 136–145)
Total Bilirubin: 0.63 mg/dL (ref 0.20–1.20)
Total Protein: 7.7 g/dL (ref 6.4–8.3)

## 2013-09-12 LAB — CBC WITH DIFFERENTIAL/PLATELET
BASO%: 0.5 % (ref 0.0–2.0)
EOS%: 1.9 % (ref 0.0–7.0)
Eosinophils Absolute: 0.1 10*3/uL (ref 0.0–0.5)
HCT: 39.2 % (ref 34.8–46.6)
HGB: 13.4 g/dL (ref 11.6–15.9)
LYMPH%: 20.3 % (ref 14.0–49.7)
MCH: 29 pg (ref 25.1–34.0)
MCHC: 34.1 g/dL (ref 31.5–36.0)
MONO#: 0.5 10*3/uL (ref 0.1–0.9)
NEUT#: 5.5 10*3/uL (ref 1.5–6.5)
NEUT%: 70.8 % (ref 38.4–76.8)
Platelets: 208 10*3/uL (ref 145–400)
RBC: 4.6 10*6/uL (ref 3.70–5.45)
RDW: 13.2 % (ref 11.2–14.5)
WBC: 7.7 10*3/uL (ref 3.9–10.3)
lymph#: 1.6 10*3/uL (ref 0.9–3.3)

## 2013-09-17 ENCOUNTER — Ambulatory Visit (HOSPITAL_BASED_OUTPATIENT_CLINIC_OR_DEPARTMENT_OTHER): Payer: Medicare Other | Admitting: Internal Medicine

## 2013-09-17 ENCOUNTER — Encounter: Payer: Self-pay | Admitting: Internal Medicine

## 2013-09-17 ENCOUNTER — Telehealth: Payer: Self-pay | Admitting: Internal Medicine

## 2013-09-17 ENCOUNTER — Ambulatory Visit (INDEPENDENT_AMBULATORY_CARE_PROVIDER_SITE_OTHER): Payer: Medicare Other | Admitting: Pharmacist Clinician (PhC)/ Clinical Pharmacy Specialist

## 2013-09-17 VITALS — BP 136/63 | HR 76 | Temp 97.4°F | Resp 20 | Ht 63.0 in | Wt 264.1 lb

## 2013-09-17 VITALS — BP 128/62 | HR 60

## 2013-09-17 DIAGNOSIS — C3490 Malignant neoplasm of unspecified part of unspecified bronchus or lung: Secondary | ICD-10-CM

## 2013-09-17 DIAGNOSIS — Z85118 Personal history of other malignant neoplasm of bronchus and lung: Secondary | ICD-10-CM

## 2013-09-17 DIAGNOSIS — I4891 Unspecified atrial fibrillation: Secondary | ICD-10-CM | POA: Diagnosis not present

## 2013-09-17 DIAGNOSIS — Z7901 Long term (current) use of anticoagulants: Secondary | ICD-10-CM | POA: Diagnosis not present

## 2013-09-17 DIAGNOSIS — I480 Paroxysmal atrial fibrillation: Secondary | ICD-10-CM

## 2013-09-17 LAB — POCT INR: INR: 2.1

## 2013-09-17 NOTE — Telephone Encounter (Signed)
Gave pt appt for lab and MD for december 2015 °

## 2013-09-17 NOTE — Patient Instructions (Signed)
Followup visit in one year with repeat CT scan of the chest. 

## 2013-09-17 NOTE — Progress Notes (Signed)
[facility] [phone]   [phone]  OFFICE PROGRESS NOTE  Georgianne Fick, MD [address] [place] [address]  DIAGNOSIS: : Stage IA (T1b N0 MX) non-small cell lung cancer, adenocarcinoma diagnosed in February 2010.   PRIOR THERAPY: Status post left upper lobectomy with node dissection under the care of Dr. Edwyna Shell on Feb 11, 2009.   CURRENT THERAPY: Observation.  INTERVAL HISTORY: [name] 62 y.o. female returns to the clinic today for routine annual followup visit. The patient is doing fine today with no specific complaints. She is feeling better today with no significant weight loss or night sweats. She denied having any significant chest pain, shortness of breath, cough or hemoptysis. She had repeat CT scan of the chest performed recently and she is here for evaluation and discussion of her scan results.  MEDICAL HISTORY: Past Medical History  Diagnosis Date  . Hypertension   . Hypercholesteremia   . Cancer 5/10    LUL Vats   . Lung cancer   . Peripheral vascular disease 8/12    Lt SFA PTA  . Sleep apnea     " mild form ,does not use cpap "  . Diabetes mellitus     insulin dependent  . GERD (gastroesophageal reflux disease)   . H/O hiatal hernia   . Arthritis   . Non-STEMI (non-ST elevated myocardial infarction) 04/07/2012    See cath results below  . Presence of stent in LAD coronary artery 04/07/2012    Xience Expedition DES 2.75 mm x 18 mm (dilated to 3.0 mm)  . Paroxysmal atrial fibrillation 04/07/2012    On Warfarin  . Heart palpitations     monitor 04/19/12-05/10/12- frequent PAF with RVR- coumadin started    ALLERGIES:  is allergic to levofloxacin and morphine and related.  MEDICATIONS:  Current Outpatient Prescriptions  Medication Sig Dispense Refill  . albuterol (PROVENTIL HFA;VENTOLIN HFA) 108 (90 BASE) MCG/ACT inhaler Inhale 1-2 puffs into the lungs every 6 (six) hours as needed for  wheezing.  1 Inhaler  0  . aspirin EC 81 MG tablet Take 81 mg by mouth daily.      . cholecalciferol (VITAMIN D) 1000 UNITS tablet Take 1,000 Units by mouth at bedtime.       Marland Kitchen ibuprofen (ADVIL,MOTRIN) 200 MG tablet Take 200-400 mg by mouth every 6 (six) hours as needed for pain or fever.      . insulin regular (NOVOLIN R,HUMULIN R) 100 units/mL injection Inject 10 Units into the skin 3 (three) times daily before meals.       Marland Kitchen lisinopril-hydrochlorothiazide (PRINZIDE,ZESTORETIC) 20-25 MG per tablet Take 1 tablet by mouth at bedtime.       . metoCLOPramide (REGLAN) 10 MG tablet Take 5-10 mg by mouth 2 (two) times daily. 5mg  in AM, 10mg  in PM Nausea      . metoprolol (LOPRESSOR) 50 MG tablet Take 1 tablet (50 mg total) by mouth 2 (two) times daily.  60 tablet  5  . nitroGLYCERIN (NITROSTAT) 0.4 MG SL tablet Place 1 tablet (0.4 mg total) under the tongue every 5 (five) minutes as needed for chest pain.  25 tablet  5  . omeprazole (PRILOSEC) 40 MG capsule Take 40 mg by mouth daily.      . simvastatin (ZOCOR) 40 MG tablet TAKE 1 TABLET BY MOUTH AT BEDTIME  30 tablet  9  . warfarin (COUMADIN) 7.5 MG tablet Take 1 tablet by mouth daily  or as directed  30 tablet  5   No current facility-administered medications for this visit.    SURGICAL HISTORY:  Past Surgical History  Procedure Laterality Date  . Abdominal hysterectomy  1960  . Cholecystectomy    . Left knee replacement    . Lung surgery  5/10  . Percutaneous coronary angioplasty and stenting  04/07/2012    Xience Expedition DES 2.51mm x 18 mm (dilated to 3.0 mm) place to the prox LAD   . Orif ankle fracture  07/09/2012    Procedure: OPEN REDUCTION INTERNAL FIXATION (ORIF) ANKLE FRACTURE;  Surgeon: Cammy Copa, MD;  Location: [facility];  Service: Orthopedics;  Laterality: Left;  open reduction internal fixation trimalleolar ankle fracture medial malleolous fixation  . Cardiac catheterization  11/04/2008    patent RCA, LM, and Circ, nl EF  .  Cardiac catheterization  02/20/2002    patent coronaries with the only abnormality being a smooth luminla irregularity in the mid intermediate ramus branch no felt to be hemodynamically significant, nl LV  . Lower extremity angiogram  05/17/11    directional atherectomy to the prox L SFA using a LX Man TurboHawk, ballooned with a [employer] balloon   . Coronary angioplasty with stent placement  04/07/12    LAD    REVIEW OF SYSTEMS:  A comprehensive review of systems was negative except for: Constitutional: positive for fatigue   PHYSICAL EXAMINATION: General appearance: alert, cooperative and no distress Head: Normocephalic, without obvious abnormality, atraumatic Neck: no adenopathy Resp: clear to auscultation bilaterally Cardio: regular rate and rhythm, S1, S2 normal, no murmur, click, rub or gallop GI: soft, non-tender; bowel sounds normal; no masses,  no organomegaly Extremities: extremities normal, atraumatic, no cyanosis or edema  ECOG PERFORMANCE STATUS: 1 - Symptomatic but completely ambulatory  Blood pressure 136/63, pulse 76, temperature 97.4 F (36.3 C), temperature source Oral, resp. rate 20, height 5\' 3"  (1.6 m), weight 264 lb 1.6 oz (119.795 kg), SpO2 100.00%.  LABORATORY DATA: Lab Results  Component Value Date   WBC 7.7 09/12/2013   HGB 13.4 09/12/2013   HCT 39.2 09/12/2013   MCV 85.2 09/12/2013   PLT 208 09/12/2013      Chemistry      Component Value Date/Time   NA 139 09/12/2013 1005   NA 136 11/23/2012 0430   NA 134 09/14/2010 0939   K 3.8 09/12/2013 1005   K 3.8 11/23/2012 0430   K 3.9 09/14/2010 0939   CL 100 11/23/2012 0430   CL 99 09/12/2012 0959   CL 96* 09/14/2010 0939   CO2 29 09/12/2013 1005   CO2 30 11/23/2012 0430   CO2 31 09/14/2010 0939   BUN 11.6 09/12/2013 1005   BUN 14 11/23/2012 0430   BUN 14 09/14/2010 0939   CREATININE 0.9 09/12/2013 1005   CREATININE 0.62 11/23/2012 0430   CREATININE 0.7 09/14/2010 0939      Component Value Date/Time     CALCIUM 9.9 09/12/2013 1005   CALCIUM 9.6 11/23/2012 0430   CALCIUM 9.3 09/14/2010 0939   ALKPHOS 98 09/12/2013 1005   ALKPHOS 81 09/09/2011 0958   ALKPHOS 93* 09/14/2010 0939   AST 20 09/12/2013 1005   AST 17 09/09/2011 0958   AST 23 09/14/2010 0939   ALT 15 09/12/2013 1005   ALT 11 09/09/2011 0958   ALT 18 09/14/2010 0939   BILITOT 0.63 09/12/2013 1005   BILITOT 0.5 09/09/2011 0958   BILITOT 0.60 09/14/2010 4782  RADIOGRAPHIC STUDIES: Ct Chest Wo Contrast  09/12/2013   CLINICAL DATA:  Followup lung carcinoma. Prior left upper lobectomy.  EXAM: CT CHEST WITHOUT CONTRAST  TECHNIQUE: Multidetector CT imaging of the chest was performed following the standard protocol without IV contrast.  COMPARISON:  09/12/2012  FINDINGS: Postop changes in the left hemithorax remain stable. No suspicious pulmonary nodules or masses are identified. There is no evidence of acute infiltrate or central endobronchial obstruction.  10 mm mediastinal lymph node in the AP window remains stable. No other sites of lymphadenopathy identified within the thorax. Small hiatal hernia again noted. No evidence of pleural or pericardial effusion.  No evidence of chest wall mass or suspicious bone lesions. Both adrenal glands remain normal in appearance.  IMPRESSION: Stable exam. No evidence of recurrent or metastatic carcinoma within the thorax.   Electronically Signed   By: Myles Rosenthal M.D.   On: 09/12/2013 12:40   ASSESSMENT AND PLAN: This is a very pleasant 62 years old [place] American female with history of stage IA non-small cell lung cancer status post left upper lobectomy with lymph node dissection and has been observation since May of 2010 with no evidence for disease recurrence. I discussed the scan results with the patient today. I recommended for her to continue on observation with repeat CT scan of the chest without contrast in one year.  She was advised to call me immediately if she has any concerning  symptoms in the interval.  All questions were answered. The patient knows to call the clinic with any problems, questions or concerns. We can certainly see the patient much sooner if necessary.

## 2013-09-19 ENCOUNTER — Encounter (HOSPITAL_COMMUNITY)
Admission: RE | Admit: 2013-09-19 | Discharge: 2013-09-19 | Disposition: A | Discharge status: Home / Self Care | Payer: Medicare Other | Source: Ambulatory Visit | Attending: Orthopedic Surgery | Admitting: Orthopedic Surgery

## 2013-09-19 DIAGNOSIS — M25569 Pain in unspecified knee: Secondary | ICD-10-CM | POA: Diagnosis not present

## 2013-09-19 DIAGNOSIS — Z96659 Presence of unspecified artificial knee joint: Secondary | ICD-10-CM | POA: Diagnosis not present

## 2013-09-19 DIAGNOSIS — Z96652 Presence of left artificial knee joint: Secondary | ICD-10-CM

## 2013-09-19 MED ORDER — TECHNETIUM TC 99M MEDRONATE IV KIT
25.0000 | PACK | Freq: Once | INTRAVENOUS | Status: AC | PRN
  Administered 2013-09-19: 25 via INTRAVENOUS

## 2013-10-18 ENCOUNTER — Ambulatory Visit (INDEPENDENT_AMBULATORY_CARE_PROVIDER_SITE_OTHER): Payer: Medicare Other | Admitting: Pharmacist Clinician (PhC)/ Clinical Pharmacy Specialist

## 2013-10-18 VITALS — BP 122/70 | HR 72

## 2013-10-18 DIAGNOSIS — Z7901 Long term (current) use of anticoagulants: Secondary | ICD-10-CM | POA: Diagnosis not present

## 2013-10-18 DIAGNOSIS — Z5181 Encounter for therapeutic drug level monitoring: Secondary | ICD-10-CM

## 2013-10-18 DIAGNOSIS — M25569 Pain in unspecified knee: Secondary | ICD-10-CM | POA: Diagnosis not present

## 2013-10-18 DIAGNOSIS — I4891 Unspecified atrial fibrillation: Secondary | ICD-10-CM

## 2013-10-18 DIAGNOSIS — I480 Paroxysmal atrial fibrillation: Secondary | ICD-10-CM

## 2013-10-18 LAB — POCT INR: INR: 2.4

## 2013-10-22 NOTE — Progress Notes (Signed)
Dr. Alvan Dame could you please put orders in [facility] as patient has pre-op appointment on 10/30/13 at 2 pm! Thank you!

## 2013-10-23 ENCOUNTER — Telehealth: Payer: Self-pay | Admitting: Cardiovascular Disease

## 2013-10-23 ENCOUNTER — Ambulatory Visit (INDEPENDENT_AMBULATORY_CARE_PROVIDER_SITE_OTHER): Payer: Medicare Other | Admitting: Podiatry

## 2013-10-23 ENCOUNTER — Encounter: Payer: Self-pay | Admitting: Podiatry

## 2013-10-23 VITALS — BP 165/63 | HR 68 | Ht 63.0 in | Wt 264.0 lb

## 2013-10-23 DIAGNOSIS — M79673 Pain in unspecified foot: Secondary | ICD-10-CM | POA: Insufficient documentation

## 2013-10-23 DIAGNOSIS — M79609 Pain in unspecified limb: Secondary | ICD-10-CM | POA: Diagnosis not present

## 2013-10-23 DIAGNOSIS — B351 Tinea unguium: Secondary | ICD-10-CM | POA: Insufficient documentation

## 2013-10-23 NOTE — Telephone Encounter (Signed)
Wants to know if she needs to come into the office to see Dr.Croitoru for cardiac clearance .Marland Kitchen Was blast seen in October .Marland Kitchen Please call to let her know , she is having surgery on 11/05/2013...  thanks

## 2013-10-23 NOTE — Telephone Encounter (Signed)
Clearance form was just signed by Dr. Loletha Grayer.  Form faxed to [facility].  Returned call and pt verified x 2.  Pt informed clearance form was just faxed and that appt not needed.  Informed [facility] should contact her about warfarin.  Pt verbalized understanding and agreed w/ plan.

## 2013-10-23 NOTE — Progress Notes (Signed)
Subjective: 63 year old female patients requesting toe nails trimmed. They are thick and painful. She is not able to bend down to reach her nails.  She is having problem with left knee infection and is scheduled for surgery in February.  Objective: Dystrophic nails x 10. No open skin lesions. No edema or erythema noted.  Neurovascular status are within normal. No gross osseous deformities.  Assessment Onychomycosis x 10. Painful toes.  Plan: Debrided all nails. Return as needed.

## 2013-10-23 NOTE — Patient Instructions (Signed)
Seen for hypertrophic nails. All nails and calluses debrided. Return in 3 months or as needed.

## 2013-10-26 ENCOUNTER — Encounter (HOSPITAL_COMMUNITY): Payer: Self-pay | Admitting: Pharmacy Technician

## 2013-10-30 ENCOUNTER — Encounter (HOSPITAL_COMMUNITY)
Admission: RE | Admit: 2013-10-30 | Discharge: 2013-10-30 | Disposition: A | Discharge status: Home / Self Care | Payer: Medicare Other | Source: Ambulatory Visit | Attending: Orthopedic Surgery | Admitting: Orthopedic Surgery

## 2013-10-30 ENCOUNTER — Encounter (HOSPITAL_COMMUNITY): Payer: Self-pay

## 2013-10-30 DIAGNOSIS — Z01812 Encounter for preprocedural laboratory examination: Secondary | ICD-10-CM | POA: Insufficient documentation

## 2013-10-30 LAB — URINE MICROSCOPIC-ADD ON

## 2013-10-30 LAB — BASIC METABOLIC PANEL
BUN: 13 mg/dL (ref 6–23)
CALCIUM: 9.7 mg/dL (ref 8.4–10.5)
CO2: 30 meq/L (ref 19–32)
Chloride: 94 mEq/L — ABNORMAL LOW (ref 96–112)
Creatinine, Ser: 0.85 mg/dL (ref 0.50–1.10)
GFR calc Af Amer: 83 mL/min — ABNORMAL LOW (ref >90)
GFR calc non Af Amer: 71 mL/min — ABNORMAL LOW (ref >90)
Glucose, Bld: 341 mg/dL — ABNORMAL HIGH (ref 70–99)
POTASSIUM: 3.7 meq/L (ref 3.7–5.3)
Sodium: 136 mEq/L — ABNORMAL LOW (ref 137–147)

## 2013-10-30 LAB — URINALYSIS, ROUTINE W REFLEX MICROSCOPIC
Ketones, ur: NEGATIVE mg/dL
Leukocytes, UA: NEGATIVE
NITRITE: NEGATIVE
PH: 5.5 (ref 5.0–8.0)
Protein, ur: 30 mg/dL — AB
Specific Gravity, Urine: 1.028 (ref 1.005–1.030)
Urobilinogen, UA: 1 mg/dL (ref 0.0–1.0)

## 2013-10-30 LAB — CBC
HCT: 39.6 % (ref 36.0–46.0)
HEMOGLOBIN: 13.7 g/dL (ref 12.0–15.0)
MCH: 29.1 pg (ref 26.0–34.0)
MCHC: 34.6 g/dL (ref 30.0–36.0)
MCV: 84.1 fL (ref 78.0–100.0)
PLATELETS: 236 10*3/uL (ref 150–400)
RBC: 4.71 MIL/uL (ref 3.87–5.11)
RDW: 12.6 % (ref 11.5–15.5)
WBC: 7.3 10*3/uL (ref 4.0–10.5)

## 2013-10-30 LAB — ABO/RH: ABO/RH(D): A POS

## 2013-10-30 LAB — PROTIME-INR
INR: 2.4 — ABNORMAL HIGH (ref 0.00–1.49)
PROTHROMBIN TIME: 25.4 s — AB (ref 11.6–15.2)

## 2013-10-30 LAB — APTT: APTT: 43 s — AB (ref 24–37)

## 2013-10-30 LAB — SURGICAL PCR SCREEN
MRSA, PCR: NEGATIVE
Staphylococcus aureus: NEGATIVE

## 2013-10-30 NOTE — Patient Instructions (Addendum)
Tiffane A Freas  10/30/2013                           YOUR PROCEDURE IS SCHEDULED ON: 11/05/13               PLEASE REPORT TO SHORT STAY CENTER AT : 11:00 AM               CALL THIS NUMBER IF ANY PROBLEMS THE DAY OF SURGERY :               [phone]                   NO VISITORS UNDER AGE 64 DUE TO FLU RESTRICTIONS                REMEMBER:   Do not eat food or drink liquids AFTER MIDNIGHT  May have clear liquids UNTIL 6 HOURS BEFORE SURGERY (8:00 AM)  Clear liquids include soda, tea, black coffee, apple or grape juice, broth.   Take these medicines the morning of surgery with A SIP OF WATER: REGLAN / METOPROLOL / PRILOSEC   Do not wear jewelry, make-up   Do not wear lotions, powders, or perfumes.   Do not shave legs or underarms 12 hrs. before surgery (men may shave face)  Do not bring valuables to the hospital.  Contacts, dentures or bridgework may not be worn into surgery.  Leave suitcase in the car. After surgery it may be brought to your room.  For patients admitted to the hospital more than one night, checkout time is 11:00                          The day of discharge.   Patients discharged the day of surgery will not be allowed to drive home                             If going home same day of surgery, must have someone stay with you first                           24 hrs at home and arrange for some one to drive you home from hospital.    Special Instructions:   Please read over the following fact sheets that you were given:               1. [place]               2. INCENTIVE SPIROMETER               3. MRSA INFORMATION                                                X_____________________________________________________________________        Failure to follow these instructions may result in cancellation of your surgery

## 2013-10-31 ENCOUNTER — Telehealth: Payer: Self-pay | Admitting: *Deleted

## 2013-10-31 NOTE — Telephone Encounter (Signed)
Returned call and pt verified x 2.  Pt stated she is having surgery Monday and she is supposed to stop the Coumadin, but didn't know about the ASA.  Stated she went for her pre-op appt yesterday and they told her to call and ask.  Pt informed Dr. Sallyanne Kuster will be notified for further instructions.  Pt verbalized understanding and agreed w/ plan.  Message forwarded to Dr. Sallyanne Kuster.

## 2013-10-31 NOTE — Telephone Encounter (Signed)
Returned call and informed pt per instructions by MD/PA.  Pt verbalized understanding and agreed w/ plan.  

## 2013-10-31 NOTE — Progress Notes (Signed)
Abnormal BMET / UA faxed to Dr. Alvan Dame

## 2013-10-31 NOTE — Telephone Encounter (Signed)
Pt was calling in regards to stopping aspirin for her surgery. She had some questions.  [place]

## 2013-10-31 NOTE — Telephone Encounter (Signed)
Whether or not her aspirin needs to be stopped is entirely up to her surgeon. As far as I'm concerned it is okay to stop it for 5 days if the surgeon requests that.

## 2013-11-01 NOTE — H&P (Signed)
TOTAL KNEE RESECTION ADMISSION H&P  Patient is being admitted for left total knee arthroplasty resection and placement of antibiotic spacer .  Subjective:  Chief Complaint:    Left knee infection / pain.  HPI: [name], 63 y.o. female, has a history of pain and functional disability in the left knee.   The patient reports left knee symptoms including: pain, weakness and stiffness which began 7 year(s) ago following a specific injury (history of left TKA in 2004, 2005 left knee scope to remove scar tissue, 2006 left knee scope for scar tissue, 2007 left TKA revision with scar tissue removal. 2013 bimalleolar ORIF of left ankle - she states the ankle fracture made her left knee pain worse, prior to ORIF she did not have to use a walker/cane). The patient describes the severity of the symptoms as severe (especially getting in and out of chairs).The patient feels that the symptoms are worsening. Symptoms are reported to be located in the left knee and include knee pain, swelling, decreased range of motion, instability, difficulty bearing weight and difficulty ambulating. The patient describes their pain as sharp. Onset of symptoms was gradual with symptoms now occurring constantly. Symptoms are exacerbated by motion at the knee, weight bearing, walking and squatting. Symptoms are relieved by rest and non-opioid analgesics (Advil, BC powder - she tries not to because she is on warfarin). Current treatment includes use of a walker (and cane).   Risks, benefits and expectations were discussed with the patient.  Risks including but not limited to the risk of anesthesia, blood clots, nerve damage, blood vessel damage, failure of the prosthesis, infection and up to and including death.  Patient understand the risks, benefits and expectations and wishes to proceed with surgery.   D/C Plans:   Home with HHPT/SNF  Post-op Meds:    No Rx given  Tranexamic Acid:   Not to be given - CAD  Decadron:    Not  to be given - DM  FYI:    Coumadin with branching Lovenox post-op  Norco post-op   Patient Active Problem List   Diagnosis Date Noted  . Onychomycosis 10/23/2013  . Pain, foot 10/23/2013  . DJD (degenerative joint disease) of knee 04/10/2013  . Vaginal atrophy 01/01/2013  . OAB (overactive bladder) 01/01/2013  . Osteopenia 01/01/2013  . Chest pain 11/28/2012  . Palpitations 11/22/2012  . Dyslipidemia 11/22/2012  . Chronic anticoagulation 11/22/2012  . Morbid obesity 07/11/2012  . Trimalleolar fracture 07/07/2012  . CAD, LAD DES 04/07/12 - Low risk Myoview March 2014 04/08/2012  . ICM, EF 40-45% cath 04/07/12- improved to 50-60% by echo Oct 2013 04/08/2012  . Paroxysmal atrial fibrillation 04/07/2012  . NSTEMI (non-ST elevated myocardial infarction)- 7/13 04/07/2012  . PVD (peripheral vascular disease), Lt SFA PTA Aug 2012 04/07/2012  . ADENOCARCINOMA, LUNG, LEFT VATS, LUL lobectomy May 2010 01/13/2009  . DIABETES, TYPE 2 11/27/2008  . HYPERTENSION 11/27/2008   Past Medical History  Diagnosis Date  . Hypertension   . Hypercholesteremia   . Cancer 5/10    LUL Vats   . Lung cancer   . Peripheral vascular disease 8/12    Lt SFA PTA  . Sleep apnea     " mild form ,does not use cpap "  . Diabetes mellitus     insulin dependent  . GERD (gastroesophageal reflux disease)   . H/O hiatal hernia   . Arthritis   . Non-STEMI (non-ST elevated myocardial infarction) 04/07/2012    See cath  results below  . Presence of stent in LAD coronary artery 04/07/2012    Xience Expedition DES 2.75 mm x 18 mm (dilated to 3.0 mm)  . Paroxysmal atrial fibrillation 04/07/2012    On Warfarin  . Heart palpitations     monitor 04/19/12-05/10/12- frequent PAF with RVR- coumadin started    Past Surgical History  Procedure Laterality Date  . Abdominal hysterectomy  1960  . Cholecystectomy    . Left knee replacement    . Lung surgery  5/10  . Percutaneous coronary angioplasty and stenting  04/07/2012     Xience Expedition DES 2.39mm x 18 mm (dilated to 3.0 mm) place to the prox LAD   . Orif ankle fracture  07/09/2012    Procedure: OPEN REDUCTION INTERNAL FIXATION (ORIF) ANKLE FRACTURE;  Surgeon: Meredith Pel, MD;  Location: [facility];  Service: Orthopedics;  Laterality: Left;  open reduction internal fixation trimalleolar ankle fracture medial malleolous fixation  . Cardiac catheterization  11/04/2008    patent RCA, LM, and Circ, nl EF  . Cardiac catheterization  02/20/2002    patent coronaries with the only abnormality being a smooth luminla irregularity in the mid intermediate ramus branch no felt to be hemodynamically significant, nl LV  . Lower extremity angiogram  05/17/11    directional atherectomy to the prox L SFA using a LX Man TurboHawk, ballooned with a [employer] balloon   . Coronary angioplasty with stent placement  04/07/12    LAD    No prescriptions prior to admission   Allergies  Allergen Reactions  . Levofloxacin Itching  . Morphine And Related Itching    History  Substance Use Topics  . Smoking status: Former Smoker -- 1.00 packs/day for 20 years    Types: Cigarettes    Quit date: 09/27/1984  . Smokeless tobacco: Never Used  . Alcohol Use: No    Family History  Problem Relation Age of Onset  . Hypertension Mother   . Coronary artery disease Brother   . Hypertension Sister      Review of Systems  Constitutional: Negative.   HENT: Negative.   Eyes: Negative.   Respiratory: Negative.   Cardiovascular: Negative.   Gastrointestinal: Positive for heartburn.  Genitourinary: Negative.   Musculoskeletal: Positive for joint pain.  Skin: Negative.   Neurological: Negative.   Endo/Heme/Allergies: Negative.   Psychiatric/Behavioral: Negative.     Objective:  Physical Exam  Constitutional: She is oriented to person, place, and time. She appears well-developed and well-nourished.  HENT:  Head: Normocephalic and atraumatic.  Mouth/Throat: Oropharynx is clear and  moist.  Eyes: Pupils are equal, round, and reactive to light.  Neck: Neck supple. No JVD present. No tracheal deviation present. No thyromegaly present.  Cardiovascular: Normal rate and intact distal pulses.   Respiratory: Effort normal and breath sounds normal.  GI: Soft. There is no tenderness. There is no guarding.  Musculoskeletal:       Left knee: She exhibits decreased range of motion, swelling, effusion, laceration (healed) and bony tenderness. She exhibits no ecchymosis, no deformity, no erythema and normal alignment. Tenderness found.  Lymphadenopathy:    She has no cervical adenopathy.  Neurological: She is alert and oriented to person, place, and time.  Skin: Skin is warm and dry.  Psychiatric: She has a normal mood and affect.     Labs:  Estimated body mass index is 46.79 kg/(m^2) as calculated from the following:   Height as of 09/17/13: 5\' 3"  (1.6 m).  Weight as of 09/17/13: 119.795 kg (264 lb 1.6 oz).   Imaging Review Plain radiographs demonstrate previous total knee arthroplasty of the left knee(s). The overall alignment is neutral. The bone quality appears to be fair for age and reported activity level.  Assessment/Plan:  Infection of left total knee arthroplasty  The patient history, physical examination, clinical judgment of the provider and imaging studies are consistent with infection of the left total knee arthroplasty and resection of the total knee arthroplasty with implantation of antibiotic spacer is deemed medically necessary. The treatment options including medical management, injection therapy arthroscopy and arthroplasty were discussed at length. The risks and benefits of total knee arthroplasty were presented and reviewed. The risks due to aseptic loosening, infection, stiffness, patella tracking problems, thromboembolic complications and other imponderables were discussed. The patient acknowledged the explanation, agreed to proceed with the plan and  consent was signed. Patient is being admitted for inpatient treatment for surgery, pain control, PT, OT, prophylactic antibiotics, VTE prophylaxis, progressive ambulation and ADL's and discharge planning. The patient is planning to be discharged to skilled nursing facility vs home.    [place] P[place] Gennaro Lizotte   PAC  11/01/2013, 3:14 PM

## 2013-11-02 NOTE — Progress Notes (Signed)
RX [facility] called in for pt per [employer] PA

## 2013-11-05 ENCOUNTER — Encounter (HOSPITAL_COMMUNITY)
Admission: RE | Disposition: A | Discharge status: Skilled Nursing Facility | Payer: Self-pay | Source: Ambulatory Visit | Attending: Orthopedic Surgery

## 2013-11-05 ENCOUNTER — Inpatient Hospital Stay (HOSPITAL_COMMUNITY): Payer: Medicare Other | Admitting: *Deleted

## 2013-11-05 ENCOUNTER — Encounter (HOSPITAL_COMMUNITY): Payer: Medicare Other | Admitting: *Deleted

## 2013-11-05 ENCOUNTER — Inpatient Hospital Stay (HOSPITAL_COMMUNITY)
Admission: RE | Admit: 2013-11-05 | Discharge: 2013-11-08 | DRG: 464 | Disposition: A | Discharge status: Skilled Nursing Facility | Payer: Medicare Other | Source: Ambulatory Visit | Attending: Orthopedic Surgery | Admitting: Orthopedic Surgery

## 2013-11-05 ENCOUNTER — Encounter (HOSPITAL_COMMUNITY): Payer: Self-pay

## 2013-11-05 DIAGNOSIS — D500 Iron deficiency anemia secondary to blood loss (chronic): Secondary | ICD-10-CM

## 2013-11-05 DIAGNOSIS — N318 Other neuromuscular dysfunction of bladder: Secondary | ICD-10-CM | POA: Diagnosis present

## 2013-11-05 DIAGNOSIS — N179 Acute kidney failure, unspecified: Secondary | ICD-10-CM | POA: Diagnosis not present

## 2013-11-05 DIAGNOSIS — G4733 Obstructive sleep apnea (adult) (pediatric): Secondary | ICD-10-CM | POA: Diagnosis present

## 2013-11-05 DIAGNOSIS — M899 Disorder of bone, unspecified: Secondary | ICD-10-CM | POA: Diagnosis present

## 2013-11-05 DIAGNOSIS — Z8249 Family history of ischemic heart disease and other diseases of the circulatory system: Secondary | ICD-10-CM

## 2013-11-05 DIAGNOSIS — Z794 Long term (current) use of insulin: Secondary | ICD-10-CM | POA: Diagnosis not present

## 2013-11-05 DIAGNOSIS — Z885 Allergy status to narcotic agent status: Secondary | ICD-10-CM

## 2013-11-05 DIAGNOSIS — M6281 Muscle weakness (generalized): Secondary | ICD-10-CM | POA: Diagnosis not present

## 2013-11-05 DIAGNOSIS — M1710 Unilateral primary osteoarthritis, unspecified knee: Secondary | ICD-10-CM | POA: Diagnosis present

## 2013-11-05 DIAGNOSIS — I2109 ST elevation (STEMI) myocardial infarction involving other coronary artery of anterior wall: Secondary | ICD-10-CM | POA: Diagnosis not present

## 2013-11-05 DIAGNOSIS — Z9861 Coronary angioplasty status: Secondary | ICD-10-CM

## 2013-11-05 DIAGNOSIS — I2510 Atherosclerotic heart disease of native coronary artery without angina pectoris: Secondary | ICD-10-CM

## 2013-11-05 DIAGNOSIS — Z6841 Body Mass Index (BMI) 40.0 and over, adult: Secondary | ICD-10-CM | POA: Diagnosis not present

## 2013-11-05 DIAGNOSIS — Z96659 Presence of unspecified artificial knee joint: Secondary | ICD-10-CM | POA: Diagnosis not present

## 2013-11-05 DIAGNOSIS — E860 Dehydration: Secondary | ICD-10-CM | POA: Diagnosis present

## 2013-11-05 DIAGNOSIS — E785 Hyperlipidemia, unspecified: Secondary | ICD-10-CM | POA: Diagnosis present

## 2013-11-05 DIAGNOSIS — I739 Peripheral vascular disease, unspecified: Secondary | ICD-10-CM | POA: Diagnosis present

## 2013-11-05 DIAGNOSIS — IMO0001 Reserved for inherently not codable concepts without codable children: Secondary | ICD-10-CM | POA: Diagnosis not present

## 2013-11-05 DIAGNOSIS — T8454XA Infection and inflammatory reaction due to internal left knee prosthesis, initial encounter: Secondary | ICD-10-CM

## 2013-11-05 DIAGNOSIS — I10 Essential (primary) hypertension: Secondary | ICD-10-CM

## 2013-11-05 DIAGNOSIS — G4730 Sleep apnea, unspecified: Secondary | ICD-10-CM | POA: Diagnosis not present

## 2013-11-05 DIAGNOSIS — K219 Gastro-esophageal reflux disease without esophagitis: Secondary | ICD-10-CM | POA: Diagnosis present

## 2013-11-05 DIAGNOSIS — Z87891 Personal history of nicotine dependence: Secondary | ICD-10-CM | POA: Diagnosis not present

## 2013-11-05 DIAGNOSIS — S8990XA Unspecified injury of unspecified lower leg, initial encounter: Secondary | ICD-10-CM | POA: Diagnosis not present

## 2013-11-05 DIAGNOSIS — D62 Acute posthemorrhagic anemia: Secondary | ICD-10-CM | POA: Diagnosis not present

## 2013-11-05 DIAGNOSIS — I252 Old myocardial infarction: Secondary | ICD-10-CM

## 2013-11-05 DIAGNOSIS — Z85118 Personal history of other malignant neoplasm of bronchus and lung: Secondary | ICD-10-CM | POA: Diagnosis not present

## 2013-11-05 DIAGNOSIS — T8450XA Infection and inflammatory reaction due to unspecified internal joint prosthesis, initial encounter: Secondary | ICD-10-CM | POA: Diagnosis not present

## 2013-11-05 DIAGNOSIS — E119 Type 2 diabetes mellitus without complications: Secondary | ICD-10-CM | POA: Diagnosis not present

## 2013-11-05 DIAGNOSIS — Z89529 Acquired absence of unspecified knee: Secondary | ICD-10-CM | POA: Diagnosis not present

## 2013-11-05 DIAGNOSIS — M949 Disorder of cartilage, unspecified: Secondary | ICD-10-CM

## 2013-11-05 DIAGNOSIS — Y831 Surgical operation with implant of artificial internal device as the cause of abnormal reaction of the patient, or of later complication, without mention of misadventure at the time of the procedure: Secondary | ICD-10-CM | POA: Diagnosis present

## 2013-11-05 DIAGNOSIS — Z01812 Encounter for preprocedural laboratory examination: Secondary | ICD-10-CM

## 2013-11-05 DIAGNOSIS — Z7901 Long term (current) use of anticoagulants: Secondary | ICD-10-CM | POA: Diagnosis not present

## 2013-11-05 DIAGNOSIS — I959 Hypotension, unspecified: Secondary | ICD-10-CM | POA: Diagnosis not present

## 2013-11-05 DIAGNOSIS — C3490 Malignant neoplasm of unspecified part of unspecified bronchus or lung: Secondary | ICD-10-CM

## 2013-11-05 DIAGNOSIS — E669 Obesity, unspecified: Secondary | ICD-10-CM | POA: Diagnosis present

## 2013-11-05 DIAGNOSIS — Z5181 Encounter for therapeutic drug level monitoring: Secondary | ICD-10-CM | POA: Diagnosis not present

## 2013-11-05 DIAGNOSIS — M2469 Ankylosis, other specified joint: Secondary | ICD-10-CM | POA: Diagnosis not present

## 2013-11-05 DIAGNOSIS — M24669 Ankylosis, unspecified knee: Secondary | ICD-10-CM | POA: Diagnosis not present

## 2013-11-05 DIAGNOSIS — E7800 Pure hypercholesterolemia, unspecified: Secondary | ICD-10-CM | POA: Diagnosis present

## 2013-11-05 DIAGNOSIS — I4891 Unspecified atrial fibrillation: Secondary | ICD-10-CM | POA: Diagnosis present

## 2013-11-05 DIAGNOSIS — S99919A Unspecified injury of unspecified ankle, initial encounter: Secondary | ICD-10-CM | POA: Diagnosis not present

## 2013-11-05 DIAGNOSIS — E1151 Type 2 diabetes mellitus with diabetic peripheral angiopathy without gangrene: Secondary | ICD-10-CM | POA: Diagnosis present

## 2013-11-05 DIAGNOSIS — Z881 Allergy status to other antibiotic agents status: Secondary | ICD-10-CM

## 2013-11-05 DIAGNOSIS — M25569 Pain in unspecified knee: Secondary | ICD-10-CM | POA: Diagnosis not present

## 2013-11-05 DIAGNOSIS — E1165 Type 2 diabetes mellitus with hyperglycemia: Secondary | ICD-10-CM

## 2013-11-05 DIAGNOSIS — K5900 Constipation, unspecified: Secondary | ICD-10-CM | POA: Diagnosis not present

## 2013-11-05 HISTORY — PX: TOTAL KNEE REVISION: SHX996

## 2013-11-05 LAB — GRAM STAIN: GRAM STAIN: NONE SEEN

## 2013-11-05 LAB — PROTIME-INR
INR: 1.04 (ref 0.00–1.49)
PROTHROMBIN TIME: 13.4 s (ref 11.6–15.2)

## 2013-11-05 LAB — GLUCOSE, CAPILLARY
GLUCOSE-CAPILLARY: 227 mg/dL — AB (ref 70–99)
GLUCOSE-CAPILLARY: 316 mg/dL — AB (ref 70–99)
GLUCOSE-CAPILLARY: 317 mg/dL — AB (ref 70–99)
GLUCOSE-CAPILLARY: 309 mg/dL — AB (ref 70–99)
Glucose-Capillary: 215 mg/dL — ABNORMAL HIGH (ref 70–99)

## 2013-11-05 LAB — TYPE AND SCREEN
ABO/RH(D): A POS
ANTIBODY SCREEN: NEGATIVE

## 2013-11-05 SURGERY — TOTAL KNEE REVISION
Anesthesia: General | Site: Knee | Laterality: Left

## 2013-11-05 MED ORDER — HYDROMORPHONE HCL PF 1 MG/ML IJ SOLN
INTRAMUSCULAR | Status: AC
  Filled 2013-11-05: qty 1

## 2013-11-05 MED ORDER — TOBRAMYCIN SULFATE 1.2 G IJ SOLR
INTRAMUSCULAR | Status: AC
  Filled 2013-11-05: qty 6

## 2013-11-05 MED ORDER — VANCOMYCIN HCL 1000 MG IV SOLR
INTRAVENOUS | Status: DC | PRN
  Administered 2013-11-05: 5 g

## 2013-11-05 MED ORDER — VANCOMYCIN HCL 1000 MG IV SOLR
1000.0000 mg | Freq: Two times a day (BID) | INTRAVENOUS | Status: DC
  Administered 2013-11-05: 1000 mg via INTRAVENOUS
  Filled 2013-11-05: qty 1000

## 2013-11-05 MED ORDER — FENTANYL CITRATE 0.05 MG/ML IJ SOLN
INTRAMUSCULAR | Status: DC | PRN
  Administered 2013-11-05: 50 ug via INTRAVENOUS
  Administered 2013-11-05 (×2): 100 ug via INTRAVENOUS

## 2013-11-05 MED ORDER — METOCLOPRAMIDE HCL 5 MG/ML IJ SOLN
5.0000 mg | Freq: Three times a day (TID) | INTRAMUSCULAR | Status: DC | PRN
  Administered 2013-11-06 – 2013-11-07 (×3): 10 mg via INTRAVENOUS
  Filled 2013-11-05 (×3): qty 2

## 2013-11-05 MED ORDER — DEXAMETHASONE SODIUM PHOSPHATE 10 MG/ML IJ SOLN
INTRAMUSCULAR | Status: AC
  Filled 2013-11-05: qty 1

## 2013-11-05 MED ORDER — BUPIVACAINE-EPINEPHRINE PF 0.25-1:200000 % IJ SOLN
INTRAMUSCULAR | Status: AC
  Filled 2013-11-05: qty 30

## 2013-11-05 MED ORDER — FERROUS SULFATE 325 (65 FE) MG PO TABS
325.0000 mg | ORAL_TABLET | Freq: Three times a day (TID) | ORAL | Status: DC
  Administered 2013-11-06 – 2013-11-08 (×3): 325 mg via ORAL
  Filled 2013-11-05 (×10): qty 1

## 2013-11-05 MED ORDER — VANCOMYCIN HCL 10 G IV SOLR
1500.0000 mg | INTRAVENOUS | Status: AC
  Administered 2013-11-05: 1500 mg via INTRAVENOUS
  Filled 2013-11-05: qty 1500

## 2013-11-05 MED ORDER — POLYETHYLENE GLYCOL 3350 17 G PO PACK
17.0000 g | PACK | Freq: Two times a day (BID) | ORAL | Status: DC
  Administered 2013-11-05 – 2013-11-07 (×3): 17 g via ORAL

## 2013-11-05 MED ORDER — MIDAZOLAM HCL 5 MG/5ML IJ SOLN
INTRAMUSCULAR | Status: DC | PRN
  Administered 2013-11-05: 2 mg via INTRAVENOUS

## 2013-11-05 MED ORDER — PHENYLEPHRINE HCL 10 MG/ML IJ SOLN
INTRAMUSCULAR | Status: DC | PRN
  Administered 2013-11-05: 120 ug via INTRAVENOUS

## 2013-11-05 MED ORDER — VANCOMYCIN HCL 1000 MG IV SOLR
1000.0000 mg | INTRAVENOUS | Status: DC
  Filled 2013-11-05: qty 1000

## 2013-11-05 MED ORDER — MIDAZOLAM HCL 2 MG/2ML IJ SOLN
INTRAMUSCULAR | Status: AC
  Filled 2013-11-05: qty 2

## 2013-11-05 MED ORDER — HYDROMORPHONE HCL PF 2 MG/ML IJ SOLN
INTRAMUSCULAR | Status: AC
  Filled 2013-11-05: qty 1

## 2013-11-05 MED ORDER — LISINOPRIL 20 MG PO TABS
20.0000 mg | ORAL_TABLET | Freq: Every day | ORAL | Status: DC
  Administered 2013-11-05: 20 mg via ORAL
  Filled 2013-11-05 (×3): qty 1

## 2013-11-05 MED ORDER — ONDANSETRON HCL 4 MG PO TABS
4.0000 mg | ORAL_TABLET | Freq: Four times a day (QID) | ORAL | Status: DC | PRN
Start: 2013-11-05 — End: 2013-11-08

## 2013-11-05 MED ORDER — INSULIN ASPART 100 UNIT/ML ~~LOC~~ SOLN
0.0000 [IU] | SUBCUTANEOUS | Status: DC
  Administered 2013-11-05: 11 [IU] via SUBCUTANEOUS
  Administered 2013-11-05: 5 [IU] via SUBCUTANEOUS
  Filled 2013-11-05: qty 1

## 2013-11-05 MED ORDER — FENTANYL CITRATE 0.05 MG/ML IJ SOLN
INTRAMUSCULAR | Status: AC
  Filled 2013-11-05: qty 5

## 2013-11-05 MED ORDER — MENTHOL 3 MG MT LOZG: 1.0000 | LOZENGE | OROMUCOSAL | Status: DC | PRN

## 2013-11-05 MED ORDER — WARFARIN SODIUM 10 MG PO TABS
10.0000 mg | ORAL_TABLET | Freq: Once | ORAL | Status: AC
  Administered 2013-11-05: 10 mg via ORAL
  Filled 2013-11-05: qty 1

## 2013-11-05 MED ORDER — CHLORHEXIDINE GLUCONATE 4 % EX LIQD: 60.0000 mL | Freq: Once | CUTANEOUS | Status: DC

## 2013-11-05 MED ORDER — ONDANSETRON HCL 4 MG/2ML IJ SOLN: 4.0000 mg | Freq: Four times a day (QID) | INTRAMUSCULAR | Status: DC | PRN

## 2013-11-05 MED ORDER — KETOROLAC TROMETHAMINE 30 MG/ML IJ SOLN
INTRAMUSCULAR | Status: AC
  Filled 2013-11-05: qty 1

## 2013-11-05 MED ORDER — HYDROCHLOROTHIAZIDE 25 MG PO TABS
25.0000 mg | ORAL_TABLET | Freq: Every day | ORAL | Status: DC
  Administered 2013-11-05 – 2013-11-06 (×2): 25 mg via ORAL
  Filled 2013-11-05 (×3): qty 1

## 2013-11-05 MED ORDER — ALBUTEROL SULFATE (2.5 MG/3ML) 0.083% IN NEBU: 2.5000 mg | INHALATION_SOLUTION | Freq: Four times a day (QID) | RESPIRATORY_TRACT | Status: DC | PRN

## 2013-11-05 MED ORDER — SODIUM CHLORIDE 0.9 % IR SOLN
Status: DC | PRN
  Administered 2013-11-05: 9000 mL

## 2013-11-05 MED ORDER — PANTOPRAZOLE SODIUM 40 MG PO TBEC
80.0000 mg | DELAYED_RELEASE_TABLET | Freq: Every day | ORAL | Status: DC
  Administered 2013-11-06 – 2013-11-08 (×3): 80 mg via ORAL
  Filled 2013-11-05 (×3): qty 2

## 2013-11-05 MED ORDER — PROPOFOL 10 MG/ML IV BOLUS
INTRAVENOUS | Status: AC
  Filled 2013-11-05: qty 20

## 2013-11-05 MED ORDER — LACTATED RINGERS IV SOLN
INTRAVENOUS | Status: DC
  Administered 2013-11-05 (×2): via INTRAVENOUS
  Administered 2013-11-05: 1000 mL via INTRAVENOUS

## 2013-11-05 MED ORDER — HYDROMORPHONE HCL PF 1 MG/ML IJ SOLN
0.5000 mg | INTRAMUSCULAR | Status: DC | PRN
  Administered 2013-11-05: 0.5 mg via INTRAVENOUS
  Filled 2013-11-05: qty 1

## 2013-11-05 MED ORDER — VITAMIN D3 25 MCG (1000 UNIT) PO TABS
1000.0000 [IU] | ORAL_TABLET | Freq: Every day | ORAL | Status: DC
  Administered 2013-11-05 – 2013-11-07 (×3): 1000 [IU] via ORAL
  Filled 2013-11-05 (×4): qty 1

## 2013-11-05 MED ORDER — SODIUM CHLORIDE 0.9 % IR SOLN
Status: DC | PRN
  Administered 2013-11-05: 1000 mL

## 2013-11-05 MED ORDER — ZOLPIDEM TARTRATE 5 MG PO TABS
5.0000 mg | ORAL_TABLET | Freq: Every evening | ORAL | Status: DC | PRN
Start: 2013-11-05 — End: 2013-11-08

## 2013-11-05 MED ORDER — PROPOFOL 10 MG/ML IV BOLUS
INTRAVENOUS | Status: DC | PRN
  Administered 2013-11-05: 200 mg via INTRAVENOUS

## 2013-11-05 MED ORDER — ONDANSETRON HCL 4 MG/2ML IJ SOLN
INTRAMUSCULAR | Status: DC | PRN
  Administered 2013-11-05: 4 mg via INTRAVENOUS

## 2013-11-05 MED ORDER — ALUM & MAG HYDROXIDE-SIMETH 200-200-20 MG/5ML PO SUSP: 30.0000 mL | ORAL | Status: DC | PRN

## 2013-11-05 MED ORDER — PHENYLEPHRINE 40 MCG/ML (10ML) SYRINGE FOR IV PUSH (FOR BLOOD PRESSURE SUPPORT)
PREFILLED_SYRINGE | INTRAVENOUS | Status: AC
  Filled 2013-11-05: qty 10

## 2013-11-05 MED ORDER — PHENOL 1.4 % MT LIQD: 1.0000 | OROMUCOSAL | Status: DC | PRN

## 2013-11-05 MED ORDER — POTASSIUM CHLORIDE 2 MEQ/ML IV SOLN
INTRAVENOUS | Status: DC
  Administered 2013-11-06 (×2): via INTRAVENOUS
  Filled 2013-11-05 (×9): qty 1000

## 2013-11-05 MED ORDER — INSULIN ASPART 100 UNIT/ML ~~LOC~~ SOLN: 0.0000 [IU] | SUBCUTANEOUS | Status: DC

## 2013-11-05 MED ORDER — INSULIN ASPART 100 UNIT/ML ~~LOC~~ SOLN
SUBCUTANEOUS | Status: AC
  Filled 2013-11-05: qty 1

## 2013-11-05 MED ORDER — CELECOXIB 200 MG PO CAPS
200.0000 mg | ORAL_CAPSULE | Freq: Two times a day (BID) | ORAL | Status: DC
  Administered 2013-11-05 – 2013-11-07 (×5): 200 mg via ORAL
  Filled 2013-11-05 (×7): qty 1

## 2013-11-05 MED ORDER — CEFAZOLIN SODIUM-DEXTROSE 2-3 GM-% IV SOLR
INTRAVENOUS | Status: AC
  Filled 2013-11-05: qty 50

## 2013-11-05 MED ORDER — LISINOPRIL-HYDROCHLOROTHIAZIDE 20-25 MG PO TABS: 1.0000 | ORAL_TABLET | Freq: Every day | ORAL | Status: DC

## 2013-11-05 MED ORDER — ENOXAPARIN SODIUM 40 MG/0.4ML ~~LOC~~ SOLN
40.0000 mg | Freq: Two times a day (BID) | SUBCUTANEOUS | Status: DC
  Administered 2013-11-06 (×2): 40 mg via SUBCUTANEOUS
  Filled 2013-11-05 (×5): qty 0.4

## 2013-11-05 MED ORDER — WARFARIN - PHARMACIST DOSING INPATIENT: Freq: Every day | Status: DC

## 2013-11-05 MED ORDER — CHLORHEXIDINE GLUCONATE 4 % EX LIQD
60.0000 mL | Freq: Once | CUTANEOUS | Status: DC
Start: 2013-11-06 — End: 2013-11-05

## 2013-11-05 MED ORDER — SIMVASTATIN 40 MG PO TABS
40.0000 mg | ORAL_TABLET | Freq: Every evening | ORAL | Status: DC
  Administered 2013-11-05 – 2013-11-07 (×3): 40 mg via ORAL
  Filled 2013-11-05 (×4): qty 1

## 2013-11-05 MED ORDER — SODIUM CHLORIDE 0.9 % IV SOLN
2500.0000 mg | INTRAVENOUS | Status: DC
Start: 2013-11-05 — End: 2013-11-05
  Filled 2013-11-05: qty 2500

## 2013-11-05 MED ORDER — VANCOMYCIN HCL IN DEXTROSE 1-5 GM/200ML-% IV SOLN
1000.0000 mg | Freq: Two times a day (BID) | INTRAVENOUS | Status: DC
  Administered 2013-11-06: 1000 mg via INTRAVENOUS
  Filled 2013-11-05 (×2): qty 200

## 2013-11-05 MED ORDER — CEFAZOLIN SODIUM-DEXTROSE 2-3 GM-% IV SOLR
2.0000 g | INTRAVENOUS | Status: AC
  Administered 2013-11-05: 2 g via INTRAVENOUS

## 2013-11-05 MED ORDER — LACTATED RINGERS IV SOLN
INTRAVENOUS | Status: DC
  Administered 2013-11-05: 1000 mL via INTRAVENOUS

## 2013-11-05 MED ORDER — ONDANSETRON HCL 4 MG/2ML IJ SOLN
INTRAMUSCULAR | Status: AC
  Filled 2013-11-05: qty 2

## 2013-11-05 MED ORDER — METOCLOPRAMIDE HCL 10 MG PO TABS: 5.0000 mg | ORAL_TABLET | Freq: Three times a day (TID) | ORAL | Status: DC | PRN

## 2013-11-05 MED ORDER — SODIUM CHLORIDE 0.9 % IJ SOLN
INTRAMUSCULAR | Status: AC
  Filled 2013-11-05: qty 50

## 2013-11-05 MED ORDER — METHOCARBAMOL 100 MG/ML IJ SOLN
500.0000 mg | Freq: Four times a day (QID) | INTRAVENOUS | Status: DC | PRN
  Administered 2013-11-05: 500 mg via INTRAVENOUS
  Filled 2013-11-05: qty 5

## 2013-11-05 MED ORDER — HYDRALAZINE HCL 20 MG/ML IJ SOLN
INTRAMUSCULAR | Status: DC | PRN
  Administered 2013-11-05: 4 mg via INTRAVENOUS

## 2013-11-05 MED ORDER — METOPROLOL TARTRATE 50 MG PO TABS
50.0000 mg | ORAL_TABLET | Freq: Two times a day (BID) | ORAL | Status: DC
  Administered 2013-11-05 – 2013-11-06 (×2): 50 mg via ORAL
  Filled 2013-11-05 (×5): qty 1

## 2013-11-05 MED ORDER — INSULIN GLARGINE 100 UNIT/ML ~~LOC~~ SOLN
20.0000 [IU] | Freq: Every day | SUBCUTANEOUS | Status: DC
  Administered 2013-11-06: 20 [IU] via SUBCUTANEOUS
  Filled 2013-11-05 (×2): qty 0.2

## 2013-11-05 MED ORDER — INSULIN ASPART 100 UNIT/ML ~~LOC~~ SOLN
7.0000 [IU] | Freq: Three times a day (TID) | SUBCUTANEOUS | Status: DC
  Administered 2013-11-06 (×3): 7 [IU] via SUBCUTANEOUS

## 2013-11-05 MED ORDER — HYDROMORPHONE HCL PF 1 MG/ML IJ SOLN
INTRAMUSCULAR | Status: DC | PRN
  Administered 2013-11-05 (×2): 1 mg via INTRAVENOUS

## 2013-11-05 MED ORDER — BISACODYL 10 MG RE SUPP
10.0000 mg | Freq: Every day | RECTAL | Status: DC | PRN
  Administered 2013-11-07: 10 mg via RECTAL
  Filled 2013-11-05: qty 1

## 2013-11-05 MED ORDER — TOBRAMYCIN SULFATE 1.2 G IJ SOLR
INTRAMUSCULAR | Status: DC | PRN
  Administered 2013-11-05: 6 g

## 2013-11-05 MED ORDER — SUCCINYLCHOLINE CHLORIDE 20 MG/ML IJ SOLN
INTRAMUSCULAR | Status: DC | PRN
  Administered 2013-11-05: 100 mg via INTRAVENOUS

## 2013-11-05 MED ORDER — METHOCARBAMOL 500 MG PO TABS
500.0000 mg | ORAL_TABLET | Freq: Four times a day (QID) | ORAL | Status: DC | PRN
  Administered 2013-11-06: 500 mg via ORAL
  Filled 2013-11-05: qty 1

## 2013-11-05 MED ORDER — HYDROCODONE-ACETAMINOPHEN 7.5-325 MG PO TABS
1.0000 | ORAL_TABLET | ORAL | Status: DC
  Administered 2013-11-05: 1 via ORAL
  Administered 2013-11-06 – 2013-11-07 (×4): 2 via ORAL
  Administered 2013-11-08: 1 via ORAL
  Filled 2013-11-05: qty 2
  Filled 2013-11-05: qty 1
  Filled 2013-11-05 (×3): qty 2
  Filled 2013-11-05: qty 1
  Filled 2013-11-05: qty 2

## 2013-11-05 MED ORDER — FLEET ENEMA 7-19 GM/118ML RE ENEM: 1.0000 | ENEMA | Freq: Once | RECTAL | Status: AC | PRN

## 2013-11-05 MED ORDER — ASPIRIN EC 81 MG PO TBEC
81.0000 mg | DELAYED_RELEASE_TABLET | Freq: Every evening | ORAL | Status: DC
  Administered 2013-11-05 – 2013-11-07 (×3): 81 mg via ORAL
  Filled 2013-11-05 (×4): qty 1

## 2013-11-05 MED ORDER — VANCOMYCIN HCL 1000 MG IV SOLR
INTRAVENOUS | Status: AC
  Filled 2013-11-05: qty 5000

## 2013-11-05 MED ORDER — BUPIVACAINE LIPOSOME 1.3 % IJ SUSP
20.0000 mL | Freq: Once | INTRAMUSCULAR | Status: DC
  Filled 2013-11-05: qty 20

## 2013-11-05 MED ORDER — DIPHENHYDRAMINE HCL 25 MG PO CAPS: 25.0000 mg | ORAL_CAPSULE | Freq: Four times a day (QID) | ORAL | Status: DC | PRN

## 2013-11-05 MED ORDER — HYDROMORPHONE HCL PF 1 MG/ML IJ SOLN
0.2500 mg | INTRAMUSCULAR | Status: DC | PRN
  Administered 2013-11-05: 0.5 mg via INTRAVENOUS
  Administered 2013-11-05: 0.25 mg via INTRAVENOUS

## 2013-11-05 MED ORDER — HYDRALAZINE HCL 20 MG/ML IJ SOLN
INTRAMUSCULAR | Status: AC
  Filled 2013-11-05: qty 1

## 2013-11-05 MED ORDER — INSULIN ASPART 100 UNIT/ML ~~LOC~~ SOLN
0.0000 [IU] | Freq: Three times a day (TID) | SUBCUTANEOUS | Status: DC
  Administered 2013-11-06 (×2): 5 [IU] via SUBCUTANEOUS

## 2013-11-05 MED ORDER — DEXAMETHASONE SODIUM PHOSPHATE 10 MG/ML IJ SOLN
INTRAMUSCULAR | Status: DC | PRN
  Administered 2013-11-05: 10 mg via INTRAVENOUS

## 2013-11-05 MED ORDER — ALBUTEROL SULFATE HFA 108 (90 BASE) MCG/ACT IN AERS: 1.0000 | INHALATION_SPRAY | Freq: Four times a day (QID) | RESPIRATORY_TRACT | Status: DC | PRN

## 2013-11-05 MED ORDER — INSULIN ASPART 100 UNIT/ML ~~LOC~~ SOLN: 0.0000 [IU] | Freq: Every day | SUBCUTANEOUS | Status: DC

## 2013-11-05 MED ORDER — INSULIN ASPART 100 UNIT/ML ~~LOC~~ SOLN
0.0000 [IU] | Freq: Three times a day (TID) | SUBCUTANEOUS | Status: DC
  Administered 2013-11-05: 11 [IU] via SUBCUTANEOUS

## 2013-11-05 MED ORDER — VANCOMYCIN HCL 1000 MG IV SOLR
INTRAVENOUS | Status: AC
Start: 2013-11-05 — End: 2013-11-05
  Filled 2013-11-05: qty 5000

## 2013-11-05 MED ORDER — DOCUSATE SODIUM 100 MG PO CAPS
100.0000 mg | ORAL_CAPSULE | Freq: Two times a day (BID) | ORAL | Status: DC
  Administered 2013-11-05 – 2013-11-08 (×5): 100 mg via ORAL

## 2013-11-05 MED ORDER — VANCOMYCIN HCL IN DEXTROSE 1-5 GM/200ML-% IV SOLN
INTRAVENOUS | Status: AC
  Filled 2013-11-05: qty 200

## 2013-11-05 SURGICAL SUPPLY — 74 items
ADH SKN CLS APL DERMABOND .7 (GAUZE/BANDAGES/DRESSINGS) ×1
BAG SPEC THK2 15X12 ZIP CLS (MISCELLANEOUS) ×1
BAG ZIPLOCK 12X15 (MISCELLANEOUS) ×2 IMPLANT
BANDAGE ELASTIC 6 VELCRO ST LF (GAUZE/BANDAGES/DRESSINGS) ×2 IMPLANT
BANDAGE ESMARK 6X9 LF (GAUZE/BANDAGES/DRESSINGS) ×1 IMPLANT
BLADE SAW SGTL 13.0X1.19X90.0M (BLADE) ×2 IMPLANT
BLADE SAW SGTL 81X20 HD (BLADE) ×2 IMPLANT
BNDG CMPR 9X6 STRL LF SNTH (GAUZE/BANDAGES/DRESSINGS) ×1
BNDG ESMARK 6X9 LF (GAUZE/BANDAGES/DRESSINGS) ×2
BOWL SMART MIX CTS (DISPOSABLE) ×4 IMPLANT
BRUSH FEMORAL CANAL (MISCELLANEOUS) ×2 IMPLANT
CEMENT HV SMART SET (Cement) ×10 IMPLANT
CUFF TOURN SGL QUICK 34 (TOURNIQUET CUFF) ×2
CUFF TRNQT CYL 34X4X40X1 (TOURNIQUET CUFF) ×1 IMPLANT
DERMABOND ADVANCED (GAUZE/BANDAGES/DRESSINGS) ×1
DERMABOND ADVANCED .7 DNX12 (GAUZE/BANDAGES/DRESSINGS) ×1 IMPLANT
DRAPE EXTREMITY T 121X128X90 (DRAPE) ×2 IMPLANT
DRAPE POUCH INSTRU U-SHP 10X18 (DRAPES) ×2 IMPLANT
DRAPE U-SHAPE 47X51 STRL (DRAPES) ×2 IMPLANT
DRSG ADAPTIC 3X8 NADH LF (GAUZE/BANDAGES/DRESSINGS) IMPLANT
DRSG AQUACEL AG ADV 3.5X10 (GAUZE/BANDAGES/DRESSINGS) IMPLANT
DRSG TEGADERM 4X4.75 (GAUZE/BANDAGES/DRESSINGS) IMPLANT
DURAPREP 26ML APPLICATOR (WOUND CARE) ×4 IMPLANT
ELECT REM PT RETURN 9FT ADLT (ELECTROSURGICAL) ×2
ELECTRODE REM PT RTRN 9FT ADLT (ELECTROSURGICAL) ×1 IMPLANT
EVACUATOR 1/8 PVC DRAIN (DRAIN) IMPLANT
FACESHIELD LNG OPTICON STERILE (SAFETY) ×12 IMPLANT
GAUZE SPONGE 2X2 8PLY STRL LF (GAUZE/BANDAGES/DRESSINGS) ×1 IMPLANT
GAUZE XEROFORM 5X9 LF (GAUZE/BANDAGES/DRESSINGS) ×2 IMPLANT
GLOVE BIOGEL PI IND STRL 7.0 (GLOVE) ×1 IMPLANT
GLOVE BIOGEL PI IND STRL 7.5 (GLOVE) ×2 IMPLANT
GLOVE BIOGEL PI IND STRL 8 (GLOVE) ×1 IMPLANT
GLOVE BIOGEL PI INDICATOR 7.0 (GLOVE) ×1
GLOVE BIOGEL PI INDICATOR 7.5 (GLOVE) ×2
GLOVE BIOGEL PI INDICATOR 8 (GLOVE) ×1
GLOVE ECLIPSE 8.0 STRL XLNG CF (GLOVE) ×4 IMPLANT
GLOVE ORTHO TXT STRL SZ7.5 (GLOVE) ×6 IMPLANT
GLOVE SURG SS PI 6.5 STRL IVOR (GLOVE) ×4 IMPLANT
GLOVE SURG SS PI 7.0 STRL IVOR (GLOVE) ×2 IMPLANT
GLOVE SURG SS PI 7.5 STRL IVOR (GLOVE) ×2 IMPLANT
GOWN SPEC L3 XXLG W/TWL (GOWN DISPOSABLE) ×6 IMPLANT
GOWN STRL REUS W/TWL LRG LVL3 (GOWN DISPOSABLE) ×2 IMPLANT
HANDPIECE INTERPULSE COAX TIP (DISPOSABLE) ×2
IMMOBILIZER KNEE 20 (SOFTGOODS) ×2 IMPLANT
IV NS IRRIG 3000ML ARTHROMATIC (IV SOLUTION) ×6 IMPLANT
KIT BASIN OR (CUSTOM PROCEDURE TRAY) ×2 IMPLANT
MANIFOLD NEPTUNE II (INSTRUMENTS) ×2 IMPLANT
MOLD CEMENT FEMORAL 65MM ×2 IMPLANT
MOLD CEMENT TIBIAL 70MM (Cement) ×2 IMPLANT
NDL SAFETY ECLIPSE 18X1.5 (NEEDLE) IMPLANT
NEEDLE HYPO 18GX1.5 SHARP (NEEDLE)
NS IRRIG 1000ML POUR BTL (IV SOLUTION) ×2 IMPLANT
PACK TOTAL JOINT (CUSTOM PROCEDURE TRAY) ×2 IMPLANT
PAD ABD 8X10 STRL (GAUZE/BANDAGES/DRESSINGS) ×2 IMPLANT
PADDING CAST COTTON 6X4 STRL (CAST SUPPLIES) ×2 IMPLANT
POSITIONER SURGICAL ARM (MISCELLANEOUS) ×2 IMPLANT
SET HNDPC FAN SPRY TIP SCT (DISPOSABLE) ×1 IMPLANT
SET PAD KNEE POSITIONER (MISCELLANEOUS) ×2 IMPLANT
SPONGE GAUZE 2X2 STER 10/PKG (GAUZE/BANDAGES/DRESSINGS) ×1
SPONGE GAUZE 4X4 12PLY (GAUZE/BANDAGES/DRESSINGS) ×2 IMPLANT
SPONGE LAP 18X18 X RAY DECT (DISPOSABLE) ×2 IMPLANT
STAPLER VISISTAT 35W (STAPLE) IMPLANT
SUCTION FRAZIER 12FR DISP (SUCTIONS) ×2 IMPLANT
SUT MNCRL AB 3-0 PS2 18 (SUTURE) ×2 IMPLANT
SUT VIC AB 1 CT1 36 (SUTURE) ×2 IMPLANT
SUT VIC AB 2-0 CT1 27 (SUTURE) ×6
SUT VIC AB 2-0 CT1 TAPERPNT 27 (SUTURE) ×3 IMPLANT
SUT VLOC 180 0 24IN GS25 (SUTURE) ×2 IMPLANT
SYR 50ML LL SCALE MARK (SYRINGE) IMPLANT
TOWEL OR 17X26 10 PK STRL BLUE (TOWEL DISPOSABLE) ×4 IMPLANT
TOWER CARTRIDGE SMART MIX (DISPOSABLE) ×4 IMPLANT
TRAY FOLEY CATH 14FRSI W/METER (CATHETERS) ×2 IMPLANT
WATER STERILE IRR 1500ML POUR (IV SOLUTION) ×2 IMPLANT
WRAP KNEE MAXI GEL POST OP (GAUZE/BANDAGES/DRESSINGS) ×2 IMPLANT

## 2013-11-05 NOTE — Progress Notes (Signed)
Pt's CBG 309. Tried paging on call PA, but answering service not connecting through. Alerted AC who was able to get in contact with Dr. Rolena Infante. Spoke with Dr. Rolena Infante who said he would contact his PA Jeneen Rinks. Awaiting call back. Pt is stable and will continue to monitor.

## 2013-11-05 NOTE — Transfer of Care (Signed)
Immediate Anesthesia Transfer of Care Note  Patient: [name]  Procedure(s) Performed: Procedure(s): LEFT TOTAL KNEE RESECTION (Left)  Patient Location: PACU  Anesthesia Type:General  Level of Consciousness: awake, sedated and patient cooperative  Airway & Oxygen Therapy: Patient Spontanous Breathing and Patient connected to face mask oxygen  Post-op Assessment: Report given to PACU RN and Post -op Vital signs reviewed and stable  Post vital signs: Reviewed and stable  Complications: No apparent anesthesia complications

## 2013-11-05 NOTE — Progress Notes (Signed)
Call to Dr Landry Dyke RE: CBG. New orders received

## 2013-11-05 NOTE — Interval H&P Note (Signed)
History and Physical Interval Note:  11/05/2013 3:27 PM  [name]  has presented today for surgery, with the diagnosis of left infected total knee  The various methods of treatment have been discussed with the patient and family. After consideration of risks, benefits and other options for treatment, the patient has consented to  Procedure(s): LEFT TOTAL KNEE RESECTION (Left) as a surgical intervention .  The patient's history has been reviewed, patient examined, no change in status, stable for surgery.  I have reviewed the patient's chart and labs.  Questions were answered to the patient's satisfaction.     Mauri Pole

## 2013-11-05 NOTE — Consult Note (Addendum)
Requesting physician: Dr Alvan Dame  Reason for consultation: Management of uncontrolled blood glucose   History of Present Illness: 63 y/o female with Pmhx of type 2 diabetes mellitus on Novolin 10 units 2 times a day with meals (last A1c of 9 about 4 months back as per patient), hypertension, obesity, obstructive sleep apnea, GERD, CAD with history of NSTEMI status post LAD stent in 2013, paroxysmal A. fib on Coumadin, arthritis of the knee status post left knee replacement was admitted to [facility] for symptoms of pain, weakness and stiffness for several years. She had left total knee arthroplasty done in 2004 followed by revision in 2007. Since symptoms were not relieved with pain control and dressed she was admitted for total knee arthroplasty and placement of antibiotic spacer in the left knee given concern for infection of the knee arthroplasty.. Patient had left total knee dissection with placement of antibiotic spacer (vancomycin and tobramycin) this evening.  Patient tolerated surgery well however are blood glucose was elevated above 300 postop. She was given 11 units of aspart in the PACU. Subsequent blood glucose was still about 100 and received another 11 units of aspart after arriving to the floor. Her fingerstick have still been elevated and [facility] hospitalists consulted for management of her blood glucose. Patient reports her blood glucose to be between 200-250 at home. She reports her last A1c of 9 in October 2014. She is not much compliant with her diet. She denies any polyuria or polydipsia. Denies any change in her weight or appetite.  Allergies:   Allergies  Allergen Reactions  . Levofloxacin Itching  . Morphine And Related Itching      Past Medical History  Diagnosis Date  . Hypertension   . Hypercholesteremia   . Cancer 5/10    LUL Vats   . Lung cancer   . Peripheral vascular disease 8/12    Lt SFA PTA  . Sleep apnea     " mild form ,does not use cpap "  .  Diabetes mellitus     insulin dependent  . GERD (gastroesophageal reflux disease)   . H/O hiatal hernia   . Arthritis   . Non-STEMI (non-ST elevated myocardial infarction) 04/07/2012    See cath results below  . Presence of stent in LAD coronary artery 04/07/2012    Xience Expedition DES 2.75 mm x 18 mm (dilated to 3.0 mm)  . Paroxysmal atrial fibrillation 04/07/2012    On Warfarin  . Heart palpitations     monitor 04/19/12-05/10/12- frequent PAF with RVR- coumadin started    Past Surgical History  Procedure Laterality Date  . Abdominal hysterectomy  1960  . Cholecystectomy    . Left knee replacement    . Lung surgery  5/10  . Percutaneous coronary angioplasty and stenting  04/07/2012    Xience Expedition DES 2.7mm x 18 mm (dilated to 3.0 mm) place to the prox LAD   . Orif ankle fracture  07/09/2012    Procedure: OPEN REDUCTION INTERNAL FIXATION (ORIF) ANKLE FRACTURE;  Surgeon: Meredith Pel, MD;  Location: [facility];  Service: Orthopedics;  Laterality: Left;  open reduction internal fixation trimalleolar ankle fracture medial malleolous fixation  . Cardiac catheterization  11/04/2008    patent RCA, LM, and Circ, nl EF  . Cardiac catheterization  02/20/2002    patent coronaries with the only abnormality being a smooth luminla irregularity in the mid intermediate ramus branch no felt to be hemodynamically significant, nl LV  . Lower extremity  angiogram  05/17/11    directional atherectomy to the prox L SFA using a LX Man TurboHawk, ballooned with a [employer] balloon   . Coronary angioplasty with stent placement  04/07/12    LAD    Medications:  Scheduled Meds: . aspirin EC  81 mg Oral QPM  . celecoxib  200 mg Oral Q12H  . docusate sodium  100 mg Oral BID  . [START ON 11/06/2013] enoxaparin (LOVENOX) injection  40 mg Subcutaneous Q12H  . [START ON 11/06/2013] ferrous sulfate  325 mg Oral TID PC  . HYDROcodone-acetaminophen  1-2 tablet Oral Q4H  . HYDROmorphone      . insulin aspart       . insulin aspart      . [START ON 11/06/2013] insulin aspart  0-15 Units Subcutaneous TID WC  . insulin aspart  0-5 Units Subcutaneous QHS  . metoprolol  50 mg Oral BID  . [START ON 11/06/2013] pantoprazole  80 mg Oral Daily  . polyethylene glycol  17 g Oral BID  . simvastatin  40 mg Oral QPM  . vancomycin  1,500 mg Intravenous STAT  . [START ON 11/06/2013] vancomycin  1,000 mg Intravenous Q12H  . Warfarin - Pharmacist Dosing Inpatient   Does not apply q1800   Continuous Infusions: . sodium chloride 0.9 % 1,000 mL with potassium chloride 10 mEq infusion     PRN Meds:.albuterol, alum & mag hydroxide-simeth, bisacodyl, diphenhydrAMINE, HYDROmorphone (DILAUDID) injection, menthol-cetylpyridinium, methocarbamol (ROBAXIN) IV, methocarbamol, metoCLOPramide (REGLAN) injection, metoCLOPramide, ondansetron (ZOFRAN) IV, ondansetron, phenol, sodium phosphate, zolpidem  Social History:  reports that she quit smoking about 29 years ago. Her smoking use included Cigarettes. She has a 20 pack-year smoking history. She has never used smokeless tobacco. She reports that she does not drink alcohol or use illicit drugs.  Family History  Problem Relation Age of Onset  . Hypertension Mother   . Coronary artery disease Brother   . Hypertension Sister     Review of Systems:  Constitutional: Denies fever, chills, diaphoresis, appetite change and fatigue.  HEENT: Denies photophobia, hearing loss, ear pain, congestion, sore throat, rhinorrhea, sneezing, mouth sores, trouble swallowing, neck pain, neck stiffness and tinnitus.   Respiratory: Denies SOB, DOE, cough, chest tightness,  and wheezing.   Cardiovascular: Denies chest pain, palpitations and leg swelling.  Gastrointestinal: Denies nausea, vomiting, abdominal pain, diarrhea, constipation, blood in stool and abdominal distention.  Genitourinary: Denies dysuria, urgency, frequency, hematuria, flank pain and difficulty urinating.  Endocrine: Denies hot or  cold intolerance, denies polyuria, polydipsia. Musculoskeletal: Denies myalgias, back pain, pain in left knee Skin: Denies pallor, rash and wound.  Neurological: Denies dizziness, seizures, syncope, weakness, light-headedness, numbness and headaches.  Psychiatric/Behavioral: Denies  confusion, nervousness, sleep disturbance and agitation   Physical Exam:  Filed Vitals:   11/05/13 1957 11/05/13 2002 11/05/13 2050 11/05/13 2150  BP: 103/61  125/65 121/71  Pulse: 82  80 81  Temp: 97.9 F (36.6 C)  97.3 F (36.3 C) 97.6 F (36.4 C)  TempSrc:   Oral Oral  Resp: 16  20 16   Height:  5\' 3"  (1.6 m)    Weight:  123.3 kg (271 lb 13.2 oz)    SpO2: 99%  100% 100%     Intake/Output Summary (Last 24 hours) at 11/05/13 2236 Last data filed at 11/05/13 2150  Gross per 24 hour  Intake   3250 ml  Output    850 ml  Net   2400 ml    General: middle  aged obese female  in no acute distress. HEENT:no pallor, moist oral mucosa Heart: Regular rate and rhythm, without murmurs, rubs, gallops. Lungs: Clear to auscultation bilaterally. Abdomen: Soft, nontender, nondistended, positive bowel sounds. Extremities: left knee brace, No edema CNS: CNS: AOX3,nonfocal.  Labs on Admission:  CBC:    Component Value Date/Time   WBC 7.3 10/30/2013 1455   WBC 7.7 09/12/2013 1005   HGB 13.7 10/30/2013 1455   HGB 13.4 09/12/2013 1005   HCT 39.6 10/30/2013 1455   HCT 39.2 09/12/2013 1005   PLT 236 10/30/2013 1455   PLT 208 09/12/2013 1005   MCV 84.1 10/30/2013 1455   MCV 85.2 09/12/2013 1005   NEUTROABS 5.5 09/12/2013 1005   NEUTROABS 4.8 11/22/2012 0515   LYMPHSABS 1.6 09/12/2013 1005   LYMPHSABS 1.8 11/22/2012 0515   MONOABS 0.5 09/12/2013 1005   MONOABS 0.5 11/22/2012 0515   EOSABS 0.1 09/12/2013 1005   EOSABS 0.2 11/22/2012 0515   BASOSABS 0.0 09/12/2013 1005   BASOSABS 0.0 11/22/2012 7017    Basic Metabolic Panel:    Component Value Date/Time   NA 136* 10/30/2013 1455   NA 139 09/12/2013 1005   NA 134  09/14/2010 0939   K 3.7 10/30/2013 1455   K 3.8 09/12/2013 1005   K 3.9 09/14/2010 0939   CL 94* 10/30/2013 1455   CL 99 09/12/2012 0959   CL 96* 09/14/2010 0939   CO2 30 10/30/2013 1455   CO2 29 09/12/2013 1005   CO2 31 09/14/2010 0939   BUN 13 10/30/2013 1455   BUN 11.6 09/12/2013 1005   BUN 14 09/14/2010 0939   CREATININE 0.85 10/30/2013 1455   CREATININE 0.9 09/12/2013 1005   CREATININE 0.7 09/14/2010 0939   GLUCOSE 341* 10/30/2013 1455   GLUCOSE 230* 09/12/2013 1005   GLUCOSE 217* 09/12/2012 0959   GLUCOSE 277* 09/14/2010 0939   CALCIUM 9.7 10/30/2013 1455   CALCIUM 9.9 09/12/2013 1005   CALCIUM 9.3 09/14/2010 0939    Radiological Exams on Admission: No results found.  Assessment/Plan  Principal Problem:  Uncontrolled DM Patient received total 22 units aspart in PACU and after arrival to the floor.  check A1C. fsg in 300s post op. . patient is on Novolin 10 units tid with meals. i will place her on lantus 20 units daily with pre meal aspart 7 units tid. Monitor fsg. continue SSI. Check BMET in am.      Left knee spacer, following removal of joint prosthesis on 2/9 Stable post op. Pain control with prn dilaudid and vicodin per primary. Also on robaxin for muscle spasm On empiric IV vancomycin Coumadin for DVT prophylaxis. Bridging with sq lovenox until INR improved.   CAD with hx of NSTEMI stable  continue ASA, metoprolol and statin  paroxysmal Afib  rate controlled on metoprolol. On chr coumadin. Monitor INR  HTN  continue  HCTZ and lisinopril  GERD  continue PPI  DVT prophylaxis  coumadin ,bridging  with sq lovenox. Monitor INR  Diet: diabetic  Time Spent on consult: 60 minutes   [facility]  hospitalists will continue to follow.   Yarnell Kozloski 11/05/2013, 10:36 PM

## 2013-11-05 NOTE — Progress Notes (Signed)
Spoke with [name]. He is to have medical do a consult. Rec'd verbal order to hold the 4 units of insulin at this time. Will continue to monitor.

## 2013-11-05 NOTE — Preoperative (Signed)
Beta Blockers   Reason not to administer Beta Blockers:Metoprolol taken at 0945 11-05-13

## 2013-11-05 NOTE — Progress Notes (Signed)
CBG 317.  Moderate sliding scale ordered by Dr Alvan Dame: pt to receive 11UNITS NOVOLOG-Dr Fortune notifed and ordered 11 UnitsNovolog  to be given subq.

## 2013-11-05 NOTE — Progress Notes (Signed)
CBG 317- Dr Winfred Leeds notified. Ordered to repeat 11 units NOVOLOG subq x1.

## 2013-11-05 NOTE — Anesthesia Preprocedure Evaluation (Addendum)
Anesthesia Evaluation  Patient identified by MRN, date of birth, ID band Patient awake    Reviewed: Allergy & Precautions, H&P , NPO status , Patient's Chart, lab work & pertinent test results, reviewed documented beta blocker date and time   History of Anesthesia Complications (+) MALIGNANT HYPERTHERMIA  Airway Mallampati: III TM Distance: >3 FB Neck ROM: full    Dental no notable dental hx. (+) Teeth Intact and Dental Advisory Given   Pulmonary sleep apnea , former smoker,  Lung Cancer LUL lobectomy.  Mild sleep apnea breath sounds clear to auscultation  Pulmonary exam normal       Cardiovascular hypertension, Pt. on home beta blockers + CAD, + Past MI and + Peripheral Vascular Disease + dysrhythmias Atrial Fibrillation Rhythm:regular Rate:Normal  Palpitations. EF 55% echo 10/13.  Low risk myoview 3/14.  NSTEMI 7/13   Neuro/Psych negative neurological ROS  negative psych ROS   GI/Hepatic negative GI ROS, Neg liver ROS,   Endo/Other  diabetes, Well Controlled, Type 2, Insulin DependentMorbid obesity  Renal/GU negative Renal ROS  negative genitourinary   Musculoskeletal   Abdominal (+) + obese,   Peds  Hematology negative hematology ROS (+)   Anesthesia Other Findings   Reproductive/Obstetrics negative OB ROS                         Anesthesia Physical Anesthesia Plan  ASA: III  Anesthesia Plan: General   Post-op Pain Management:    Induction: Intravenous  Airway Management Planned: Oral ETT  Additional Equipment:   Intra-op Plan:   Post-operative Plan: Extubation in OR  Informed Consent: I have reviewed the patients History and Physical, chart, labs and discussed the procedure including the risks, benefits and alternatives for the proposed anesthesia with the patient or authorized representative who has indicated his/her understanding and acceptance.   Dental Advisory  Given  Plan Discussed with: CRNA and Surgeon  Anesthesia Plan Comments:         Anesthesia Quick Evaluation

## 2013-11-05 NOTE — Anesthesia Postprocedure Evaluation (Signed)
Anesthesia Post Note  Patient: [name]  Procedure(s) Performed: Procedure(s) (LRB): LEFT TOTAL KNEE RESECTION (Left)  Anesthesia type: General  Patient location: PACU  Post pain: Pain level controlled  Post assessment: Post-op Vital signs reviewed  Last Vitals:  Filed Vitals:   11/05/13 2050  BP: 125/65  Pulse: 80  Temp: 36.3 C  Resp: 20    Post vital signs: Reviewed  Level of consciousness: sedated  Complications: No apparent anesthesia complications

## 2013-11-05 NOTE — Progress Notes (Addendum)
ANTIBIOTIC CONSULT NOTE - INITIAL  Pharmacy Consult for Vancomycin  Indication: Possible PJI  Allergies  Allergen Reactions  . Levofloxacin Itching  . Morphine And Related Itching    Patient Measurements:   Adjusted Body Weight:   Vital Signs: Temp: 97.9 F (36.6 C) (02/09 1957) Temp src: Oral (02/09 1131) BP: 103/61 mmHg (02/09 1957) Pulse Rate: 82 (02/09 1957) Intake/Output from previous day:   Intake/Output from this shift: Total I/O In: 600 [I.V.:600] Out: 50 [Urine:50]  Labs: No results found for this basename: WBC, HGB, PLT, LABCREA, CREATININE,  in the last 72 hours The CrCl is unknown because both a height and weight (above a minimum accepted value) are required for this calculation. No results found for this basename: VANCOTROUGH, Corlis Leak, VANCORANDOM, GENTTROUGH, GENTPEAK, GENTRANDOM, TOBRATROUGH, TOBRAPEAK, TOBRARND, AMIKACINPEAK, AMIKACINTROU, AMIKACIN,  in the last 72 hours   Microbiology: Recent Results (from the past 720 hour(s))  SURGICAL PCR SCREEN     Status: None   Collection Time    10/30/13  2:52 PM      Result Value Range Status   MRSA, PCR NEGATIVE  NEGATIVE Final   Staphylococcus aureus NEGATIVE  NEGATIVE Final   Comment:            The Xpert SA Assay (FDA     approved for NASAL specimens     in patients over 35 years of age),     is one component of     a comprehensive surveillance     program.  Test performance has     been validated by [employer] for patients greater     than or equal to 51 year old.     It is not intended     to diagnose infection nor to     guide or monitor treatment.  GRAM STAIN     Status: None   Collection Time    11/05/13  4:15 PM      Result Value Range Status   Specimen Description SYNOVIAL LEFT KNEE   Final   Special Requests NONE   Final   Gram Stain     Final   Value: NO ORGANISMS SEEN     NO WBC SEEN     NO SQUAMOUS EPITHELIAL CELLS SEEN     Gram Stain Report Called to,Read Back By and  Verified With: ANDERSON,C AT 1816 ON [id number] BY POTEAT,S   Report Status 11/05/2013 FINAL   Final    Medical History: Past Medical History  Diagnosis Date  . Hypertension   . Hypercholesteremia   . Cancer 5/10    LUL Vats   . Lung cancer   . Peripheral vascular disease 8/12    Lt SFA PTA  . Sleep apnea     " mild form ,does not use cpap "  . Diabetes mellitus     insulin dependent  . GERD (gastroesophageal reflux disease)   . H/O hiatal hernia   . Arthritis   . Non-STEMI (non-ST elevated myocardial infarction) 04/07/2012    See cath results below  . Presence of stent in LAD coronary artery 04/07/2012    Xience Expedition DES 2.75 mm x 18 mm (dilated to 3.0 mm)  . Paroxysmal atrial fibrillation 04/07/2012    On Warfarin  . Heart palpitations     monitor 04/19/12-05/10/12- frequent PAF with RVR- coumadin started    Assessment: 63 yoF with infected left TKR with arthrofibrosis s/p resection and antibiotic spacer placement 2/9.  Pharmacy consulted to dose vancomycin.  Ancef and vancomycin given preop.    D1 Antibiotics 2/9 >> Ancef >> 2/9  2/9 >> Vancomycin >>  Tm24h:  Renal: SCr 0.85, CrCl ~79 (N 64) WBC: 7.3K  Microbiology: 2/9: Gram Stain Synovial fluid left knee: No organisms seen 2/9: Body fluid collected:  2/9: Anaerobic culture collected:   Last weight = 118kg (2/3) Allergies: Levofloxacin (itching)  Goal of Therapy:  Vancomycin trough level 15-20 mcg/ml  Plan:  Vancomycin 1500 x 1 (to make total 2500mg  today), then 1g IV q12h starting tomorrow.  F/u renal fxn, cultures, clinical course.   Ralene Bathe, PharmD, BCPS 11/05/2013, 8:26 PM  Pager: [phone]

## 2013-11-05 NOTE — Brief Op Note (Signed)
11/05/2013  6:11 PM  PATIENT:  [name]  63 y.o. 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  PROCEDURE:  Procedure(s): LEFT TOTAL KNEE RESECTION (Left), placement of antibiotic spacer (Vanco and Tobra 1:1 with cement X 5)  SURGEON:  Surgeon(s) and Role:    * Mauri Pole, MD - Primary  PHYSICIAN ASSISTANT: Danae Orleans, PA-C  ANESTHESIA:   general  EBL:  Total I/O In: 2000 [I.V.:2000] Out: 800 [Urine:400; Blood:400]  BLOOD ADMINISTERED:none  DRAINS: none   LOCAL MEDICATIONS USED:  NONE  SPECIMEN:  Source of Specimen:  left knee  DISPOSITION OF SPECIMEN:  PATHOLOGY  COUNTS:  YES  TOURNIQUET:   Total Tourniquet Time Documented: Thigh (Left) - 80 minutes Total: Thigh (Left) - 80 minutes   DICTATION: .Other Dictation: Dictation Number [id number]  PLAN OF CARE: Admit to inpatient   PATIENT DISPOSITION:  PACU - hemodynamically stable.   Delay start of Pharmacological VTE agent (>24hrs) due to surgical blood loss or risk of bleeding: no

## 2013-11-05 NOTE — Progress Notes (Signed)
ANTICOAGULATION CONSULT NOTE - Initial Consult  Pharmacy Consult for Warfarin Indication: PAF  Allergies  Allergen Reactions  . Levofloxacin Itching  . Morphine And Related Itching    Patient Measurements: Height: 5\' 3"  (160 cm) Weight: 271 lb 13.2 oz (123.3 kg) IBW/kg (Calculated) : 52.4 Heparin Dosing Weight:   Vital Signs: Temp: 97.9 F (36.6 C) (02/09 1957) Temp src: Oral (02/09 1131) BP: 103/61 mmHg (02/09 1957) Pulse Rate: 82 (02/09 1957)  Labs:  Recent Labs  11/05/13 1140  LABPROT 13.4  INR 1.04    Estimated Creatinine Clearance: 85.3 ml/min (by C-G formula based on Cr of 0.85).   Medical History: Past Medical History  Diagnosis Date  . Hypertension   . Hypercholesteremia   . Cancer 5/10    LUL Vats   . Lung cancer   . Peripheral vascular disease 8/12    Lt SFA PTA  . Sleep apnea     " mild form ,does not use cpap "  . Diabetes mellitus     insulin dependent  . GERD (gastroesophageal reflux disease)   . H/O hiatal hernia   . Arthritis   . Non-STEMI (non-ST elevated myocardial infarction) 04/07/2012    See cath results below  . Presence of stent in LAD coronary artery 04/07/2012    Xience Expedition DES 2.75 mm x 18 mm (dilated to 3.0 mm)  . Paroxysmal atrial fibrillation 04/07/2012    On Warfarin  . Heart palpitations     monitor 04/19/12-05/10/12- frequent PAF with RVR- coumadin started    Assessment: 83 yoF admitted for infected TKA s/p resection and antibiotic spacer placement 2/9.  Pt on chronic coumadin for hx PAF and pharmacy consulted to dose inpatient with lovenox bridge until INR >1.8.  Note pt also on ASA 81mg  and PRN ibuprofen PTA.    Home dose warfarin: 7.5mg  qday except 3.75mg  on M/F.  Last dose 10/30/13. INR today =  1.04.  CBC 2/3: no issues noted.    Goal of Therapy:  INR 2-3 Monitor platelets by anticoagulation protocol: Yes   Plan:  Lovenox 40mg  q 12 h until INR > 1.8 Warfarin 10mg  po x 1 tonight.  Daily PT/INR,  CBC  Ralene Bathe, PharmD, BCPS 11/05/2013, 8:38 PM  Pager: [phone]

## 2013-11-06 ENCOUNTER — Encounter (HOSPITAL_COMMUNITY): Payer: Self-pay | Admitting: Orthopedic Surgery

## 2013-11-06 DIAGNOSIS — T8450XA Infection and inflammatory reaction due to unspecified internal joint prosthesis, initial encounter: Secondary | ICD-10-CM | POA: Diagnosis not present

## 2013-11-06 DIAGNOSIS — D500 Iron deficiency anemia secondary to blood loss (chronic): Secondary | ICD-10-CM | POA: Diagnosis not present

## 2013-11-06 DIAGNOSIS — I10 Essential (primary) hypertension: Secondary | ICD-10-CM | POA: Diagnosis not present

## 2013-11-06 DIAGNOSIS — Z96659 Presence of unspecified artificial knee joint: Secondary | ICD-10-CM

## 2013-11-06 DIAGNOSIS — I2510 Atherosclerotic heart disease of native coronary artery without angina pectoris: Secondary | ICD-10-CM | POA: Diagnosis not present

## 2013-11-06 DIAGNOSIS — Z89529 Acquired absence of unspecified knee: Secondary | ICD-10-CM | POA: Diagnosis not present

## 2013-11-06 DIAGNOSIS — Z5181 Encounter for therapeutic drug level monitoring: Secondary | ICD-10-CM | POA: Diagnosis not present

## 2013-11-06 LAB — GLUCOSE, CAPILLARY
GLUCOSE-CAPILLARY: 222 mg/dL — AB (ref 70–99)
Glucose-Capillary: 103 mg/dL — ABNORMAL HIGH (ref 70–99)
Glucose-Capillary: 224 mg/dL — ABNORMAL HIGH (ref 70–99)
Glucose-Capillary: 226 mg/dL — ABNORMAL HIGH (ref 70–99)
Glucose-Capillary: 241 mg/dL — ABNORMAL HIGH (ref 70–99)
Glucose-Capillary: 244 mg/dL — ABNORMAL HIGH (ref 70–99)

## 2013-11-06 LAB — BASIC METABOLIC PANEL
BUN: 15 mg/dL (ref 6–23)
CHLORIDE: 97 meq/L (ref 96–112)
CO2: 26 meq/L (ref 19–32)
Calcium: 8.8 mg/dL (ref 8.4–10.5)
Creatinine, Ser: 0.8 mg/dL (ref 0.50–1.10)
GFR calc Af Amer: 88 mL/min — ABNORMAL LOW (ref >90)
GFR calc non Af Amer: 76 mL/min — ABNORMAL LOW (ref >90)
Glucose, Bld: 261 mg/dL — ABNORMAL HIGH (ref 70–99)
Potassium: 4.5 mEq/L (ref 3.7–5.3)
SODIUM: 135 meq/L — AB (ref 137–147)

## 2013-11-06 LAB — PROTIME-INR
INR: 1.12 (ref 0.00–1.49)
PROTHROMBIN TIME: 14.2 s (ref 11.6–15.2)

## 2013-11-06 LAB — CBC
HCT: 33.9 % — ABNORMAL LOW (ref 36.0–46.0)
Hemoglobin: 11.7 g/dL — ABNORMAL LOW (ref 12.0–15.0)
MCH: 29.2 pg (ref 26.0–34.0)
MCHC: 34.5 g/dL (ref 30.0–36.0)
MCV: 84.5 fL (ref 78.0–100.0)
Platelets: 203 10*3/uL (ref 150–400)
RBC: 4.01 MIL/uL (ref 3.87–5.11)
RDW: 12.9 % (ref 11.5–15.5)
WBC: 15.7 10*3/uL — AB (ref 4.0–10.5)

## 2013-11-06 LAB — HEMOGLOBIN A1C
Hgb A1c MFr Bld: 8.7 % — ABNORMAL HIGH
Mean Plasma Glucose: 203 mg/dL — ABNORMAL HIGH

## 2013-11-06 MED ORDER — INSULIN ASPART 100 UNIT/ML ~~LOC~~ SOLN
0.0000 [IU] | Freq: Every day | SUBCUTANEOUS | Status: DC
  Administered 2013-11-06: 2 [IU] via SUBCUTANEOUS

## 2013-11-06 MED ORDER — DEXTROSE 5 % IV SOLN
2.0000 g | INTRAVENOUS | Status: DC
  Administered 2013-11-06 – 2013-11-07 (×2): 2 g via INTRAVENOUS
  Filled 2013-11-06 (×3): qty 2

## 2013-11-06 MED ORDER — INSULIN ASPART PROT & ASPART (70-30 MIX) 100 UNIT/ML ~~LOC~~ SUSP
25.0000 [IU] | Freq: Two times a day (BID) | SUBCUTANEOUS | Status: DC
  Administered 2013-11-07 – 2013-11-08 (×3): 25 [IU] via SUBCUTANEOUS
  Filled 2013-11-06 (×2): qty 10

## 2013-11-06 MED ORDER — SODIUM CHLORIDE 0.9 % IJ SOLN
10.0000 mL | INTRAMUSCULAR | Status: DC | PRN
  Administered 2013-11-07: 10 mL

## 2013-11-06 MED ORDER — VANCOMYCIN HCL 10 G IV SOLR
1250.0000 mg | Freq: Two times a day (BID) | INTRAVENOUS | Status: DC
  Administered 2013-11-06: 1250 mg via INTRAVENOUS
  Filled 2013-11-06 (×2): qty 1250

## 2013-11-06 MED ORDER — INSULIN ASPART 100 UNIT/ML ~~LOC~~ SOLN
0.0000 [IU] | Freq: Three times a day (TID) | SUBCUTANEOUS | Status: DC
  Administered 2013-11-07: 3 [IU] via SUBCUTANEOUS
  Administered 2013-11-07: 2 [IU] via SUBCUTANEOUS
  Administered 2013-11-08 (×2): 1 [IU] via SUBCUTANEOUS

## 2013-11-06 MED ORDER — WARFARIN SODIUM 10 MG PO TABS
10.0000 mg | ORAL_TABLET | Freq: Once | ORAL | Status: AC
  Administered 2013-11-06: 10 mg via ORAL
  Filled 2013-11-06: qty 1

## 2013-11-06 NOTE — Op Note (Signed)
[name], [name] NO.:  [id number]  MEDICAL RECORD NO.:  [id number]  LOCATION:  51                         FACILITY:  [facility]  PHYSICIAN:  Pietro Cassis. Alvan Dame, M.D.  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  PROCEDURE:  Resection, left total knee arthroplasty, placement of antibiotic spacer utilizing a 1:1 ratio vancomycin and tobramycin for batch of cement for 5 in total.  SURGEON:  Pietro Cassis. Alvan Dame, MD  ASSISTANT:  Danae Orleans, PA-C.  Note that [name] was present for the entirety of the case from preoperative positioning, perioperative management of the operative extremity, general facilitation of the case and primary wound closure.  ANESTHESIA:  General.  SPECIMENS:  I did send some synovial fluid off for pathology, evaluation of Gram-stain culture pending.  The patient was noted to have elevated sedimentation rate and C-reactive protein in addition to the findings by bone scan of a loose total knee.  DRAINS:  None.  BLOOD LOSS:  Probably about 200 mL.  TOURNIQUET:  Up for 77 minutes at 300 mmHg.  COMPLICATIONS:  None apparent.  INDICATIONS FOR PROCEDURE:  [name] is a 63 year old female was referred for second opinion evaluation of her left total knee.  She had a longstanding history of multiple surgical procedures in the left knee with the most recent procedure performed and revision components, she had persistent pain, postoperative stiffness.  She was seen and evaluated and lab work revealed concerns for infection, bone scan revealed concerns for loosening of the components.  Given all these findings, as well as the  challenges she has had in the postoperative period, the plan and recommendation was to proceed with a resection of her left total knee arthroplasty, placement of antibiotic spacer with the treatment with reimplantation when labs were normalized.  Risks, benefits, and necessity of the procedure were reviewed and consent was obtained for benefit of treatment for this problem.  PROCEDURE IN DETAIL:  The patient was brought to operative theater. Once adequate anesthesia preoperative antibiotics, Ancef 2 g and vancomycin 1 g administered, she was positioned supine.  A left thigh tourniquet placed, left lower extremity was then prepped and draped in sterile fashion.  Time-out was performed identifying the patient, planned procedure, and extremity.  Leg was placed in DeMayo leg holder.  Leg was exsanguinated, tourniquet was elevated to 300 mmHg.  The patient's old incision was excised and extended slightly proximal and distal, soft tissue planes created through a significant amount of adipose tissue.  An arthrotomy was made, without significant purulence identified, culture swabs taken unless. Following initial exposure including a banana peel-type exposure of the proximal tibia as well as extending the incision into the quad tendon proximally, I was able to expose the knee in flexion.  Her initial flexion arc was noted to only be about 30 degrees total.  I was able to flex her knee up, removed the old polyethylene insert.  Following this, we worked first  on the femoral side using thin ACL saw and osteotomes and were able to remove the femoral component without insignificant more bone loss than was already there.  At this point, I worked on the tibia.  The patient's tibia was noted to be significantly internally rotated, and the medial and lateral aspect tibias were fairly eroded.  Nonetheless, fortunately using a thin ACL saw and osteotomes, I was able to remove the tibial component in  total, removing the entire component which had been configured preop for the first procedure.  I then used the [place] osteotomes for the cement removal to remove the remaining cement.  The knee was brought to extension and the patellar button was removed.  At this point, I used 3 L normal saline solution and irrigated the canal with a canal brush irrigator.  During the time that I was doing this, [name] was on the back table making spacer molds measured and selected based on in comparison to the components that were removed.  I mixed 2 batches of cement with 2 g of vancomycin and 2.4 g of tobramycin for the femoral component, one batch of cement with 1 g of vancomycin and one of tobramycin for the tibial component.  Two other batches of cement were mixed with tobramycin and vancomycin in same ratio to cement the components in place.  Following irrigation of the entire knee with another 3 L normal saline solution, pulse lavaged, placed some cement into the proximal tibia in the distal femur and then mixed the other batch of cement in the tibial tray and the femoral components were then cemented on the end of the bone into the femur and proximal tibia and the knee was brought to extension.  We irrigated the knee again as this was settling out.  Once this was completed, I reapproximated the extensor mechanism using a #1 PDS suture in figure-of-eight fashion.  Once this was completed, the subcutaneous layer was reapproximated using 2-0 Vicryl.  The remaining of the wound was closed with 2 staples.  The skin was cleaned, dried, and dressed sterilely using Xeroform and a bulky dressing.  She was then brought to the recovery room in stable condition, tolerating the procedure well.  We will consult Infectious Disease postoperatively. She will be touchdown weightbearing.  The plan is for reimplantation, will be determined based on a return to normal of her sedimentation C- reactive protein.   Most likely, based on lack of identification of significant purulence, this will be considered culture negative infection, treated with vancomycin.  Infectious Disease will be consulted nonetheless.     Pietro Cassis Alvan Dame, M.D.     MDO/MEDQ  D:  11/05/2013  T:  11/06/2013  Job:  [id number]

## 2013-11-06 NOTE — Progress Notes (Signed)
Clinical Social Work Department BRIEF PSYCHOSOCIAL ASSESSMENT 11/06/2013  Patient:  [name], [name]     Account Number:  [id number]     Admit date:  11/05/2013  Clinical Social Worker:  Lacie Scotts  Date/Time:  11/06/2013 01:42 PM  Referred by:  Physician  Date Referred:  11/06/2013 Referred for  SNF Placement   Other Referral:   Interview type:  Patient Other interview type:    PSYCHOSOCIAL DATA Living Status:  FAMILY Admitted from facility:   Level of care:   Primary support name:  Valari Taylor Primary support relationship to patient:  CHILD, ADULT Degree of support available:   unclear    CURRENT CONCERNS Current Concerns  Post-Acute Placement   Other Concerns:    SOCIAL WORK ASSESSMENT / PLAN Pt is a 63 yr old female living at home prior to hospitalization. CSW met with pt to assist with d/c planning. Pt has made prior arrangements to have ST Rehab at [facility] following hospital d/c. CSW has contacted SNF and d/c plan has been confirmed. CSW will continue to follow to assist with d/c planning.   Assessment/plan status:  Psychosocial Support/Ongoing Assessment of Needs Other assessment/ plan:   Information/referral to community resources:   Insurance coverage for SNF placement reviewed.    PATIENT'S/FAMILY'S RESPONSE TO PLAN OF CARE: Pt was hoping to d/c home instead of having rehab at d/c. " I thought I could go home this time and have rehab in 8 wks when come back for my next procedure. PT feels I need SNF now. " Pt is disappointed but willing to accept SNF placement.   Werner Lean LCSW [phone]

## 2013-11-06 NOTE — Evaluation (Signed)
Physical Therapy Evaluation Patient Details Name: [name] MRN: [id number] DOB: 03/19/1951 Today's Date: 11/06/2013 Time: 8676-7209 PT Time Calculation (min): 24 min  PT Assessment / Plan / Recommendation History of Present Illness  63 yo female s/p L knee resection and spacer 11/05/13. Hx of L TKA 2004, 2007 L knee revision with scar tissue removal, L ankle fx, DJD, osteopenia, obesity.   Clinical Impression  On eval, pt required Mod assist of 2 for mobility-able to ambulate ~15 feet with walker. Demonstrates decreased strength, decreased activity tolerance, and impaired gait and balance. Recommend ST rehab at [facility] for continued rehab after hospital stay.     PT Assessment  Patient needs continued PT services    Follow Up Recommendations  SNF    Does the patient have the potential to tolerate intense rehabilitation      Barriers to Discharge        Equipment Recommendations  None recommended by PT    Recommendations for Other Services     Frequency Min 4X/week    Precautions / Restrictions Precautions Precautions: Fall;Knee Required Braces or Orthoses: Knee Immobilizer - Left Knee Immobilizer - Left: On when out of bed or walking Restrictions Weight Bearing Restrictions: Yes LLE Weight Bearing: Partial weight bearing LLE Partial Weight Bearing Percentage or Pounds: 25%   Pertinent Vitals/Pain 5/10 L knee with activity.       Mobility  Bed Mobility Overal bed mobility: Needs Assistance Bed Mobility: Supine to Sit Supine to sit: +2 for safety/equipment;+2 for physical assistance;Mod assist General bed mobility comments: Increased time. Assist for trunk and bil LEs. VCs safety, technique, hand placement Transfers Overall transfer level: Needs assistance Transfers: Sit to/from Stand Sit to Stand: +2 safety/equipment;+2 physical assistance;Mod assist;From elevated surface General transfer comment: Assist to rise, stabilize, control descent. VCs safety,  technique, hand placement. Ambulation/Gait Ambulation/Gait assistance: +2 safety/equipment;+2 physical assistance;Mod assist Ambulation Distance (Feet): 15 Feet Assistive device: Rolling walker (2 wheeled) General Gait Details: VCs safety, technique, posture, adherence to PWB status. Followed with recliner. Fatigues easily. Pt stated pain was "not that bad " with ambulation    Exercises     PT Diagnosis: Difficulty walking;Abnormality of gait;Generalized weakness;Acute pain  PT Problem List: Decreased strength;Decreased range of motion;Decreased activity tolerance;Decreased balance;Decreased mobility;Pain;Decreased knowledge of precautions;Decreased knowledge of use of DME;Obesity PT Treatment Interventions: DME instruction;Gait training;Functional mobility training;Therapeutic activities;Therapeutic exercise;Patient/family education;Balance training     PT Goals(Current goals can be found in the care plan section) Acute Rehab PT Goals Patient Stated Goal: to be done with knee surgeries PT Goal Formulation: With patient Time For Goal Achievement: 11/20/13 Potential to Achieve Goals: Good  Visit Information  Last PT Received On: 11/06/13 Assistance Needed: +2 History of Present Illness: 63 yo female s/p L knee resection and spacer 11/05/13. Hx of L TKA 2004, 2007 L knee revision with scar tissue removal, L ankle fx, DJD, osteopenia, obesity.        Prior [place] expects to be discharged to:: Skilled nursing facility Living Arrangements: Children Prior Function Level of Independence: Independent with assistive device(s) Communication Communication: No difficulties    Cognition  Cognition Arousal/Alertness: Awake/alert Behavior During Therapy: WFL for tasks assessed/performed Overall Cognitive Status: Within Functional Limits for tasks assessed    Extremity/Trunk Assessment Upper Extremity Assessment Upper Extremity Assessment: Generalized  weakness Lower Extremity Assessment Lower Extremity Assessment: Generalized weakness;LLE deficits/detail LLE: Unable to fully assess due to pain;Unable to fully assess due to immobilization Cervical / Trunk Assessment Cervical /  Trunk Assessment: Normal   Balance    End of Session PT - End of Session Activity Tolerance: Patient limited by fatigue;Patient limited by pain Patient left: in chair;with call bell/phone within reach;with nursing/sitter in room Nurse Communication: Mobility status  GP     Weston Anna, MPT Pager: [phone]

## 2013-11-06 NOTE — Progress Notes (Signed)
   Subjective: 1 Day Post-Op Procedure(s) (LRB): LEFT TOTAL KNEE RESECTION (Left)   Patient reports pain as mild, pain controlled. No events throughout the night.   Objective:   VITALS:   Filed Vitals:   11/06/13  BP: 146/62  Pulse: 77  Temp: 97.5 F (36.4 C)   Resp: 16    Neurovascular intact Dorsiflexion/Plantar flexion intact Incision: dressing C/D/I No cellulitis present Compartment soft  LABS  Recent Labs  11/06/13 0410  HGB 11.7*  HCT 33.9*  WBC 15.7*  PLT 203     Recent Labs  11/06/13 0410  NA 135*  K 4.5  BUN 15  CREATININE 0.80  GLUCOSE 261*     Assessment/Plan: 1 Day Post-Op Procedure(s) (LRB): LEFT TOTAL KNEE RESECTION (Left) Foley cath d/c'ed Advance diet Up with therapy D/C IV fluids Discharge to SNF eventually, when ready ID consult placed.  Discussion was had with Dr. Carlyle Basques, who has agreed to assist in patient treatment.  Expected ABLA  Treated with iron and will observe  Morbid Obesity (BMI >40)  Estimated body mass index is 48.16 kg/(m^2) as calculated from the following:   Height as of this encounter: 5\' 3"  (1.6 m).   Weight as of this encounter: 123.3 kg (271 lb 13.2 oz). Patient also counseled that weight may inhibit the healing process Patient counseled that losing weight will help with future health issues      [name]   PAC  11/06/2013, 8:42 AM

## 2013-11-06 NOTE — Progress Notes (Signed)
ANTICOAGULATION CONSULT NOTE - Initial Consult  Pharmacy Consult for Warfarin Indication: PAF  Allergies  Allergen Reactions  . Levofloxacin Itching  . Morphine And Related Itching    Patient Measurements: Height: 5\' 3"  (160 cm) Weight: 271 lb 13.2 oz (123.3 kg) IBW/kg (Calculated) : 52.4   Vital Signs: Temp: 98.4 F (36.9 C) (02/10 0952) Temp src: Oral (02/10 0415) BP: 100/56 mmHg (02/10 0952) Pulse Rate: 76 (02/10 0952)  Labs:  Recent Labs  11/05/13 1140 11/06/13 0410  HGB  --  11.7*  HCT  --  33.9*  PLT  --  203  LABPROT 13.4 14.2  INR 1.04 1.12  CREATININE  --  0.80    Estimated Creatinine Clearance: 90.6 ml/min (by C-G formula based on Cr of 0.8).   Medical History: Past Medical History  Diagnosis Date  . Hypertension   . Hypercholesteremia   . Cancer 5/10    LUL [facility]   . Lung cancer   . Peripheral vascular disease 8/12    Lt SFA PTA  . Sleep apnea     " mild form ,does not use cpap "  . Diabetes mellitus     insulin dependent  . GERD (gastroesophageal reflux disease)   . H/O hiatal hernia   . Arthritis   . Non-STEMI (non-ST elevated myocardial infarction) 04/07/2012    See cath results below  . Presence of stent in LAD coronary artery 04/07/2012    Xience Expedition DES 2.75 mm x 18 mm (dilated to 3.0 mm)  . Paroxysmal atrial fibrillation 04/07/2012    On Warfarin  . Heart palpitations     monitor 04/19/12-05/10/12- frequent PAF with RVR- coumadin started    Assessment: 59 yoF admitted for infected TKA s/p resection and antibiotic spacer placement 2/9.  Pt on chronic coumadin for hx PAF, Held prior to admission for procedure and pharmacy consulted to dose inpatient with lovenox bridge until INR >1.8.  Note pt also on ASA 81mg  and PRN ibuprofen PTA.    Home dose warfarin: 7.5mg  qday except 3.75mg  on M/F: Last dose PTA 10/30/13.  INR today remains low after 1 dose for re-initiation of warfarin  H/H and Plt okay    Goal of Therapy:  INR  2-3 Monitor platelets by anticoagulation protocol: Yes   Plan:   Continue Lovenox 40mg  q 12 h until INR > 1.8  Repeat Warfarin 10mg  po x 1 tonight.   Daily PT/INR, CBC  Abdur Hoglund, Gaye Alken PharmD Pager #: [phone] 1:03 PM 11/06/2013

## 2013-11-06 NOTE — Progress Notes (Signed)
OT Cancellation Note  Patient Details Name: [name] MRN: [id number] DOB: 01-18-1951   Cancelled Treatment:    Reason Eval/Treat Not Completed: Other (comment)  Plan is for pt to have rehab in SNF; OT eval is not needed for admission.  Will defer OT to next venue.    [name] 11/06/2013, 11:49 AM Lesle Chris, OTR/L [phone] 11/06/2013

## 2013-11-06 NOTE — Progress Notes (Signed)
Utilization review completed.  

## 2013-11-06 NOTE — Progress Notes (Signed)
Clinical Social Work Department CLINICAL SOCIAL WORK PLACEMENT NOTE 11/06/2013  Patient:  [name], [name]  Account Number:  [id number] Admit date:  11/05/2013  Clinical Social Worker:  Werner Lean, LCSW  Date/time:  11/06/2013 01:51 PM  Clinical Social Work is seeking post-discharge placement for this patient at the following level of care:   SKILLED NURSING   (*CSW will update this form in Epic as items are completed)     Patient/family provided with [place] Department of Clinical Social Work's list of facilities offering this level of care within the geographic area requested by the patient (or if unable, by the patient's family).  11/06/2013  Patient/family informed of their freedom to choose among providers that offer the needed level of care, that participate in Medicare, Medicaid or managed care program needed by the patient, have an available bed and are willing to accept the patient.    Patient/family informed of [facility]' ownership interest in [facility], as well as of the fact that they are under no obligation to receive care at this facility.  PASARR submitted to [employer] on  PASARR number received from [employer] on 07/11/2012  FL2 transmitted to all facilities in geographic area requested by pt/family on  11/06/2013 FL2 transmitted to all facilities within larger geographic area on 11/06/2013  Patient informed that his/her managed care company has contracts with or will negotiate with  certain facilities, including the following:     Patient/family informed of bed offers received:  11/06/2013 Patient chooses bed at [facility] Physician recommends and patient chooses bed at    Patient to be transferred to [place] on   Patient to be transferred to facility by   The following physician request were entered in Epic:   Additional Comments:  Werner Lean LCSW [phone]

## 2013-11-06 NOTE — Progress Notes (Signed)
Arrived to insert PICC but patient is just getting up in chair.  Explained that it will probably be tomorrow morning before PICC will be placed.  Patient is agreeable

## 2013-11-06 NOTE — Plan of Care (Signed)
Problem: Consults Goal: Diagnosis- Total Joint Replacement Left Total Knee Resection

## 2013-11-06 NOTE — Progress Notes (Signed)
Peripherally Inserted Central Catheter/Midline Placement  The IV Nurse has discussed with the patient and/or persons authorized to consent for the patient, the purpose of this procedure and the potential benefits and risks involved with this procedure.  The benefits include less needle sticks, lab draws from the catheter and patient may be discharged home with the catheter.  Risks include, but not limited to, infection, bleeding, blood clot (thrombus formation), and puncture of an artery; nerve damage and irregular heat beat.  Alternatives to this procedure were also discussed.  PICC/Midline Placement Documentation  PICC / Midline Single Lumen 62/26/33 PICC Right Basilic 44 cm 2 cm (Active)       [name] 11/06/2013, 8:39 PM

## 2013-11-06 NOTE — Progress Notes (Signed)
Physical Therapy Treatment Patient Details Name: [name] MRN: [id number] DOB: [birth year] Today's Date: 11/06/2013 Time: 0630-1601 PT Time Calculation (min): 25 min  PT Assessment / Plan / Recommendation  History of Present Illness 63 yo female s/p L knee resection and spacer 11/05/13. Hx of L TKA 2004, 2007 L knee revision with scar tissue removal, L ankle fx, DJD, osteopenia, obesity.    PT Comments   Progressing slowly with mobility. Pt c/o increased pain L lower leg while sitting in chair-likely due to positioning of LE. Pt also c/o compression stocking feeling as if it is too tight-RN made aware. Assisted pt back to bed and applied ice pack.   Follow Up Recommendations  SNF     Does the patient have the potential to tolerate intense rehabilitation     Barriers to Discharge        Equipment Recommendations  None recommended by PT    Recommendations for Other Services    Frequency Min 4X/week   Progress towards PT Goals Progress towards PT goals: Progressing toward goals  Plan Current plan remains appropriate    Precautions / Restrictions Precautions Precautions: Knee;Fall Required Braces or Orthoses: Knee Immobilizer - Left Knee Immobilizer - Left: On when out of bed or walking Restrictions Weight Bearing Restrictions: Yes LLE Weight Bearing: Partial weight bearing LLE Partial Weight Bearing Percentage or Pounds: 25%   Pertinent Vitals/Pain 7/10 L LE. Repositioned and applied ice pack. Pt reports some relief in pain but still requested PT mention to nursing that she feels compression stockings are too tight.     Mobility  Bed Mobility Overal bed mobility: Needs Assistance Bed Mobility: Supine to Sit Supine to sit: Mod assist General bed mobility comments: Assist for bil LEs onto bed. Increased time.  Transfers Overall transfer level: Needs assistance Transfers: Sit to/from Stand;Stand Pivot Transfers Sit to Stand: +2 safety/equipment;+2 physical  assistance;Mod assist General transfer comment: Assist to rise, stabilize, control descent. VCs safety, technique, hand placement. Ambulation/Gait Ambulation/Gait assistance: +2 safety/equipment;+2 physical assistance;Mod assist Ambulation Distance (Feet): 4 Feet Assistive device: Rolling walker (2 wheeled) Gait Pattern/deviations: Step-to pattern;Antalgic;Decreased stride length;Decreased step length - left General Gait Details: 4 steps backwards and 2 steps sideways to [facility] with walker. VCs safety, technique, adherence to precautions. Increased time.     Exercises     PT Diagnosis: Difficulty walking;Abnormality of gait;Generalized weakness;Acute pain  PT Problem List: Decreased strength;Decreased range of motion;Decreased activity tolerance;Decreased balance;Decreased mobility;Pain;Decreased knowledge of precautions;Decreased knowledge of use of DME;Obesity PT Treatment Interventions: DME instruction;Gait training;Functional mobility training;Therapeutic activities;Therapeutic exercise;Patient/family education;Balance training   PT Goals (current goals can now be found in the care plan section) Acute Rehab PT Goals Patient Stated Goal: to be done with knee surgeries PT Goal Formulation: With patient Time For Goal Achievement: 11/20/13 Potential to Achieve Goals: Good  Visit Information  Last PT Received On: 11/06/13 Assistance Needed: +2 History of Present Illness: 63 yo female s/p L knee resection and spacer 11/05/13. Hx of L TKA 2004, 2007 L knee revision with scar tissue removal, L ankle fx, DJD, osteopenia, obesity.     Subjective Data  Patient Stated Goal: to be done with knee surgeries   Cognition  Cognition Arousal/Alertness: Awake/alert Behavior During Therapy: WFL for tasks assessed/performed Overall Cognitive Status: Within Functional Limits for tasks assessed    Balance     End of Session PT - End of Session Activity Tolerance: Patient limited by pain Patient left:  in bed;with call bell/phone within reach Nurse  Communication: Mobility status   GP     Weston Anna, MPT Pager: [phone]

## 2013-11-06 NOTE — Consult Note (Signed)
[facility] for Infectious Disease  Total days of antibiotics 2        Day 2 vanco        (cefazolin, tobra)               Reason for Consult: chronic left knee pji POD#1 s/p HW removal, and antibiotic spacer    Referring Physician: olin  Principal Problem:   Left knee spacer, following removal of joint prosthesis Active Problems:   Morbid obesity   Type II or unspecified type diabetes mellitus without mention of complication, uncontrolled   Expected blood loss anemia    HPI: [name] is a 63 y.o. female with history of OA of left knee s/p left TKA in 2004, 2005 left knee scope to remove scar tissue, 2006 left knee scope for scar tissue, 2007 left TKA revision with scar tissue removal. 2013 bimalleolar ORIF of left ankle - she states the ankle fracture made her left knee pain worse, prior to ORIF she did not have to use a walker/cane). Over the past year, she has had severe pain to her left knee concerning for loosening of hardware, ? Chronic infection. Due to inflammatory markers being elevated, there was concern for subacute infection. She was admitted for [facility] removal and antibiotic spacer on 11/05/13 for presumed left knee pji. She was started on vancomycin post operatively while OR cultures are pending.     Past Medical History  Diagnosis Date  . Hypertension   . Hypercholesteremia   . Cancer 5/10    LUL Vats   . Lung cancer   . Peripheral vascular disease 8/12    Lt SFA PTA  . Sleep apnea     " mild form ,does not use cpap "  . Diabetes mellitus     insulin dependent  . GERD (gastroesophageal reflux disease)   . H/O hiatal hernia   . Arthritis   . Non-STEMI (non-ST elevated myocardial infarction) 04/07/2012    See cath results below  . Presence of stent in LAD coronary artery 04/07/2012    [employer] Expedition DES 2.75 mm x 18 mm (dilated to 3.0 mm)  . Paroxysmal atrial fibrillation 04/07/2012    On Warfarin  . Heart palpitations     monitor  04/19/12-05/10/12- frequent PAF with RVR- coumadin started    Allergies:  Allergies  Allergen Reactions  . Levofloxacin Itching  . Morphine And Related Itching    MEDICATIONS: . aspirin EC  81 mg Oral QPM  . celecoxib  200 mg Oral Q12H  . cholecalciferol  1,000 Units Oral QHS  . docusate sodium  100 mg Oral BID  . enoxaparin (LOVENOX) injection  40 mg Subcutaneous Q12H  . ferrous sulfate  325 mg Oral TID PC  . hydrochlorothiazide  25 mg Oral Daily  . HYDROcodone-acetaminophen  1-2 tablet Oral Q4H  . insulin aspart  0-15 Units Subcutaneous TID WC  . insulin aspart  0-5 Units Subcutaneous QHS  . insulin aspart  7 Units Subcutaneous TID WC  . insulin glargine  20 Units Subcutaneous QHS  . lisinopril  20 mg Oral Daily  . metoprolol  50 mg Oral BID  . pantoprazole  80 mg Oral Daily  . polyethylene glycol  17 g Oral BID  . simvastatin  40 mg Oral QPM  . vancomycin  1,250 mg Intravenous Q12H  . warfarin  10 mg Oral ONCE-1800  . Warfarin - Pharmacist Dosing Inpatient   Does not apply [phone]  History  Substance Use Topics  . Smoking status: Former Smoker -- 1.00 packs/day for 20 years    Types: Cigarettes    Quit date: 09/27/1984  . Smokeless tobacco: Never Used  . Alcohol Use: No    Family History  Problem Relation Age of Onset  . Hypertension Mother   . Coronary artery disease Brother   . Hypertension Sister      Review of Systems  Constitutional: Negative for fever, chills, diaphoresis, activity change, appetite change, fatigue and unexpected weight change.  HENT: Negative for congestion, sore throat, rhinorrhea, sneezing, trouble swallowing and sinus pressure.  Eyes: Negative for photophobia and visual disturbance.  Respiratory: Negative for cough, chest tightness, shortness of breath, wheezing and stridor.  Cardiovascular: Negative for chest pain, palpitations and leg swelling.  Gastrointestinal: Negative for nausea, vomiting, abdominal pain, diarrhea, constipation,  blood in stool, abdominal distention and anal bleeding.  Genitourinary: Negative for dysuria, hematuria, flank pain and difficulty urinating.  Musculoskeletal: left knee pain Skin: Negative for color change, pallor, rash and wound.  Neurological: Negative for dizziness, tremors, weakness and light-headedness.  Hematological: Negative for adenopathy. Does not bruise/bleed easily.  Psychiatric/Behavioral: Negative for behavioral problems, confusion, sleep disturbance, dysphoric mood, decreased concentration and agitation.     OBJECTIVE: Temp:  [97 F (36.1 C)-98.4 F (36.9 C)] 97.6 F (36.4 C) (02/10 1330) Pulse Rate:  [71-93] 71 (02/10 1330) Resp:  [16-25] 16 (02/10 1330) BP: (100-162)/(43-73) 111/64 mmHg (02/10 1330) SpO2:  [99 %-100 %] 100 % (02/10 1330) Weight:  [271 lb 13.2 oz (123.3 kg)] 271 lb 13.2 oz (123.3 kg) (02/09 2002) Physical Exam  Constitutional:  oriented to person, place, and time. appears well-developed and well-nourished. No distress.  HENT:  Mouth/Throat: Oropharynx is clear and moist. No oropharyngeal exudate.  Cardiovascular: Normal rate, regular rhythm and normal heart sounds. Exam reveals no gallop and no friction rub.  No murmur heard.  Pulmonary/Chest: Effort normal and breath sounds normal. No respiratory distress.  has no wheezes.  Abdominal: Soft. Bowel sounds are normal.  exhibits no distension. There is no tenderness.  Lymphadenopathy: no cervical adenopathy.  Neurological: alert and oriented to person, place, and time.  Ext: left knee is wrapped from surgery Skin: Skin is warm and dry. No rash noted. No erythema.  Psychiatric: a normal mood and affect. behavior is normal.   LABS: Results for orders placed during the hospital encounter of 11/05/13 (from the past 48 hour(s))  PROTIME-INR     Status: None   Collection Time    11/05/13 11:40 AM      Result Value Range   Prothrombin Time 13.4  11.6 - 15.2 seconds   INR 1.04  0.00 - 1.49  GLUCOSE,  CAPILLARY     Status: Abnormal   Collection Time    11/05/13 11:58 AM      Result Value Range   Glucose-Capillary 227 (*) 70 - 99 mg/dL  GLUCOSE, CAPILLARY     Status: Abnormal   Collection Time    11/05/13  1:43 PM      Result Value Range   Glucose-Capillary 215 (*) 70 - 99 mg/dL   Comment 1 Documented in Chart    GRAM STAIN     Status: None   Collection Time    11/05/13  4:15 PM      Result Value Range   Specimen Description SYNOVIAL LEFT KNEE     Special Requests NONE     Gram Stain  Value: NO ORGANISMS SEEN     NO WBC SEEN     NO SQUAMOUS EPITHELIAL CELLS SEEN     Gram Stain Report Called to,Read Back By and Verified With: ANDERSON,C AT 1816 ON 020915 BY POTEAT,S   Report Status 11/05/2013 FINAL    ANAEROBIC CULTURE     Status: None   Collection Time    11/05/13  4:15 PM      Result Value Range   Specimen Description SYNOVIAL LEFT KNEE     Special Requests NONE     Gram Stain       Value: RARE WBC PRESENT, PREDOMINANTLY PMN     NO ORGANISMS SEEN     Performed at [facility]   Culture       Value: NO ANAEROBES ISOLATED; CULTURE IN PROGRESS FOR 5 DAYS     Performed at [facility]   Report Status PENDING    BODY FLUID CULTURE     Status: None   Collection Time    11/05/13  4:15 PM      Result Value Range   Specimen Description SYNOVIAL LEFT KNEE     Special Requests NONE     Gram Stain       Value: RARE WBC PRESENT, PREDOMINANTLY PMN     NO ORGANISMS SEEN     Performed at [facility]   Culture       Value: NO GROWTH     Performed at [facility]   Report Status PENDING    GLUCOSE, CAPILLARY     Status: Abnormal   Collection Time    11/05/13  6:43 PM      Result Value Range   Glucose-Capillary 317 (*) 70 - 99 mg/dL   Comment 1 Notify RN    GLUCOSE, CAPILLARY     Status: Abnormal   Collection Time    11/05/13  7:29 PM      Result Value Range   Glucose-Capillary 316 (*) 70 - 99 mg/dL  GLUCOSE, CAPILLARY     Status:  Abnormal   Collection Time    11/05/13  9:11 PM      Result Value Range   Glucose-Capillary 309 (*) 70 - 99 mg/dL   Comment 1 Documented in Chart     Comment 2 Notify RN    GLUCOSE, CAPILLARY     Status: Abnormal   Collection Time    11/05/13 11:57 PM      Result Value Range   Glucose-Capillary 244 (*) 70 - 99 mg/dL   Comment 1 Documented in Chart     Comment 2 Notify RN    CBC     Status: Abnormal   Collection Time    11/06/13  4:10 AM      Result Value Range   WBC 15.7 (*) 4.0 - 10.5 K/uL   RBC 4.01  3.87 - 5.11 MIL/uL   Hemoglobin 11.7 (*) 12.0 - 15.0 g/dL   HCT 33.9 (*) 36.0 - 46.0 %   MCV 84.5  78.0 - 100.0 fL   MCH 29.2  26.0 - 34.0 pg   MCHC 34.5  30.0 - 36.0 g/dL   RDW 12.9  11.5 - 15.5 %   Platelets 203  150 - 400 K/uL  BASIC METABOLIC PANEL     Status: Abnormal   Collection Time    11/06/13  4:10 AM      Result Value Range   Sodium 135 (*) 137 - 147  mEq/L   Potassium 4.5  3.7 - 5.3 mEq/L   Chloride 97  96 - 112 mEq/L   CO2 26  19 - 32 mEq/L   Glucose, Bld 261 (*) 70 - 99 mg/dL   BUN 15  6 - 23 mg/dL   Creatinine, Ser 0.80  0.50 - 1.10 mg/dL   Calcium 8.8  8.4 - 10.5 mg/dL   GFR calc non Af Amer 76 (*) >90 mL/min   GFR calc Af Amer 88 (*) >90 mL/min   Comment: (NOTE)     The eGFR has been calculated using the CKD EPI equation.     This calculation has not been validated in all clinical situations.     eGFR's persistently <90 mL/min signify possible Chronic Kidney     Disease.  PROTIME-INR     Status: None   Collection Time    11/06/13  4:10 AM      Result Value Range   Prothrombin Time 14.2  11.6 - 15.2 seconds   INR 1.12  0.00 - 1.49  HEMOGLOBIN A1C     Status: Abnormal   Collection Time    11/06/13  4:10 AM      Result Value Range   Hemoglobin A1C 8.7 (*) <5.7 %   Comment: (NOTE)                                                                               According to the ADA Clinical Practice Recommendations for 2011, when     HbA1c is used as  a screening test:      >=6.5%   Diagnostic of Diabetes Mellitus               (if abnormal result is confirmed)     5.7-6.4%   Increased risk of developing Diabetes Mellitus     References:Diagnosis and Classification of Diabetes Mellitus,Diabetes     OXBD,5329,92(EQAST 1):S62-S69 and Standards of Medical Care in             Diabetes - 2011,Diabetes Care,2011,34 (Suppl 1):S11-S61.   Mean Plasma Glucose 203 (*) <117 mg/dL   Comment: Performed at [facility], CAPILLARY     Status: Abnormal   Collection Time    11/06/13  4:11 AM      Result Value Range   Glucose-Capillary 241 (*) 70 - 99 mg/dL   Comment 1 Documented in Chart     Comment 2 Notify RN    GLUCOSE, CAPILLARY     Status: Abnormal   Collection Time    11/06/13  7:25 AM      Result Value Range   Glucose-Capillary 226 (*) 70 - 99 mg/dL  GLUCOSE, CAPILLARY     Status: Abnormal   Collection Time    11/06/13 11:52 AM      Result Value Range   Glucose-Capillary 224 (*) 70 - 99 mg/dL   Comment 1 Notify RN     Comment 2 Documented in Chart      MICRO: 2/9 body fluid culture pending IMAGING: No results found.  HISTORICAL MICRO/IMAGING  Assessment/Plan:  63yo F with presumed chronic left knee pji POD#1 s/p HW removal and antibiotic spacer  placement  - recommend to treat as culture negative pji with vancomycin plus ceftriaxone 2gm IV daily - will check sed rate and crp on morning labs - will get picc line for long term antibiotics in the morning - plan to treat for 6 wks. - will need weekly cbc, bmp, and vanco trough goal of 15-20. If vanco dose changes, then trough should be checked before 4th dose of new dosage change and not wait for weekly blood draw. - will have her follow up in the ID clinic in 4-6 wks.  [facility] [phone]

## 2013-11-06 NOTE — Progress Notes (Signed)
PROGRESS NOTE    [name] ZJQ:[id number] DOB: [birth year] DOA: 11/05/2013 PCP: [name], MD  HPI/Brief narrative 63 year old female with history of type II DM/IDDM, HTN, obesity, OSA, GERD, CAD status post stent, PAF on Coumadin, status post several left knee procedures, admitted by orthopedic service for chronic left knee prosthetic joint infection, status post hardware removal and antibiotic spacer placement and Hospitalists were consulted for assistance in diabetes management.  Assessment/Plan:  Uncontrolled type II DM/IDDM - Patient states that she was on Humalog mix 75/25 in the past which was eventually switched to Lantus which she could not afford. Prior to admission she was only on short acting insulins 3 times a day. Currently placed on Lantus 20 units each bedtime, NovoLog mealtime 7 units 3 times a day and sliding scale insulin and her blood sugars are ranging mostly in the 200s. She will not be able to afford Lantus. Diabetes coordinator input appreciated - Will switch to 70/30 insulin at 25 units twice a day and sensitive SSI. This will need further adjustment. Patient agreeable. Hemoglobin A1c: 8.7  History of CAD - Asymptomatic. Continue aspirin, beta blockers and statins.  Paroxysmal A. Fib - Controlled ventricular rate. Continue beta blockers and anticoagulation with Coumadin  Hypertension - Controlled. Continue HCTZ and lisinopril.   GERD - Continue PPI  Acute blood loss anemia  - Secondary to surgery. Follow CBC     Subjective: Appropriate left knee pain. Denies any other complaints. Was seen working with PT this morning.   Objective: Filed Vitals:   11/06/13 0952 11/06/13 1200 11/06/13 1330 11/06/13 1600  BP: 100/56  111/64   Pulse: 76  71   Temp: 98.4 F (36.9 C)  97.6 F (36.4 C)   TempSrc:      Resp: 16 16 16 16   Height:      Weight:      SpO2: 100% 100% 100% 100%    Intake/Output Summary (Last 24 hours) at 11/06/13 1814 Last  data filed at 11/06/13 1715  Gross per 24 hour  Intake 3265.83 ml  Output   1050 ml  Net 2215.83 ml   Filed Weights   11/05/13 2002  Weight: 123.3 kg (271 lb 13.2 oz)     Exam:  General exam: Middle-aged female sitting up at edge of bed working with physical therapy. Appears in mild pain.  Respiratory system: Clear. No increased work of breathing. Cardiovascular system: S1 & S2 heard, RRR. No JVD, murmurs, gallops, clicks or pedal edema. Gastrointestinal system: Abdomen is nondistended, soft and nontender. Normal bowel sounds heard. Central nervous system: Alert and oriented. No focal neurological deficits. Extremities: Symmetric 5 x 5 power.   Data Reviewed: Basic Metabolic Panel:  Recent Labs Lab 11/06/13 0410  NA 135*  K 4.5  CL 97  CO2 26  GLUCOSE 261*  BUN 15  CREATININE 0.80  CALCIUM 8.8   Liver Function Tests: No results found for this basename: AST, ALT, ALKPHOS, BILITOT, PROT, ALBUMIN,  in the last 168 hours No results found for this basename: LIPASE, AMYLASE,  in the last 168 hours No results found for this basename: AMMONIA,  in the last 168 hours CBC:  Recent Labs Lab 11/06/13 0410  WBC 15.7*  HGB 11.7*  HCT 33.9*  MCV 84.5  PLT 203   Cardiac Enzymes: No results found for this basename: CKTOTAL, CKMB, CKMBINDEX, TROPONINI,  in the last 168 hours BNP (last 3 results) No results found for this basename: PROBNP,  in the last  8760 hours CBG:  Recent Labs Lab 11/05/13 2357 11/06/13 0411 11/06/13 0725 11/06/13 1152 11/06/13 1730  GLUCAP 244* 241* 226* 224* 103*    Recent Results (from the past 240 hour(s))  SURGICAL PCR SCREEN     Status: None   Collection Time    10/30/13  2:52 PM      Result Value Range Status   MRSA, PCR NEGATIVE  NEGATIVE Final   Staphylococcus aureus NEGATIVE  NEGATIVE Final   Comment:            The Xpert SA Assay (FDA     approved for NASAL specimens     in patients over 72 years of age),     is one component  of     a comprehensive surveillance     program.  Test performance has     been validated by [employer] for patients greater     than or equal to 29 year old.     It is not intended     to diagnose infection nor to     guide or monitor treatment.  GRAM STAIN     Status: None   Collection Time    11/05/13  4:15 PM      Result Value Range Status   Specimen Description SYNOVIAL LEFT KNEE   Final   Special Requests NONE   Final   Gram Stain     Final   Value: NO ORGANISMS SEEN     NO WBC SEEN     NO SQUAMOUS EPITHELIAL CELLS SEEN     Gram Stain Report Called to,Read Back By and Verified With: ANDERSON,C AT 1816 ON 020915 BY POTEAT,S   Report Status 11/05/2013 FINAL   Final  ANAEROBIC CULTURE     Status: None   Collection Time    11/05/13  4:15 PM      Result Value Range Status   Specimen Description SYNOVIAL LEFT KNEE   Final   Special Requests NONE   Final   Gram Stain     Final   Value: RARE WBC PRESENT, PREDOMINANTLY PMN     NO ORGANISMS SEEN     Performed at [facility]   Culture     Final   Value: NO ANAEROBES ISOLATED; CULTURE IN PROGRESS FOR 5 DAYS     Performed at [facility]   Report Status PENDING   Incomplete  BODY FLUID CULTURE     Status: None   Collection Time    11/05/13  4:15 PM      Result Value Range Status   Specimen Description SYNOVIAL LEFT KNEE   Final   Special Requests NONE   Final   Gram Stain     Final   Value: RARE WBC PRESENT, PREDOMINANTLY PMN     NO ORGANISMS SEEN     Performed at [facility]   Culture     Final   Value: NO GROWTH     Performed at [facility]   Report Status PENDING   Incomplete       Studies: No results found.      Scheduled Meds: . aspirin EC  81 mg Oral QPM  . cefTRIAXone (ROCEPHIN)  IV  2 g Intravenous Q24H  . celecoxib  200 mg Oral Q12H  . cholecalciferol  1,000 Units Oral QHS  . docusate sodium  100 mg Oral BID  . enoxaparin (LOVENOX) injection  40 mg  Subcutaneous Q12H  . ferrous sulfate  325 mg Oral TID PC  . hydrochlorothiazide  25 mg Oral Daily  . HYDROcodone-acetaminophen  1-2 tablet Oral Q4H  . insulin aspart  0-15 Units Subcutaneous TID WC  . insulin aspart  0-5 Units Subcutaneous QHS  . insulin aspart  7 Units Subcutaneous TID WC  . insulin glargine  20 Units Subcutaneous QHS  . lisinopril  20 mg Oral Daily  . metoprolol  50 mg Oral BID  . pantoprazole  80 mg Oral Daily  . polyethylene glycol  17 g Oral BID  . simvastatin  40 mg Oral QPM  . vancomycin  1,250 mg Intravenous Q12H  . warfarin  10 mg Oral ONCE-1800  . Warfarin - Pharmacist Dosing Inpatient   Does not apply q1800   Continuous Infusions: . sodium chloride 0.9 % 1,000 mL with potassium chloride 10 mEq infusion 100 mL/hr at 11/06/13 0745    Principal Problem:   Left knee spacer, following removal of joint prosthesis Active Problems:   Morbid obesity   Type II or unspecified type diabetes mellitus without mention of complication, uncontrolled   Expected blood loss anemia    Time spent: 30 minutes.    Vernell Leep, MD, FACP, FHM. [facility] Pager [phone]  If 7PM-7AM, please contact night-coverage [email or web address] Password [email or web address] 11/06/2013, 6:14 PM    LOS: 1 day

## 2013-11-06 NOTE — Progress Notes (Signed)
Inpatient Diabetes Program Recommendations  AACE/ADA: New Consensus Statement on Inpatient Glycemic Control (2013)  Target Ranges:  Prepandial:   less than 140 mg/dL      Peak postprandial:   less than 180 mg/dL (1-2 hours)      Critically ill patients:  140 - 180 mg/dL   Reason for Visit: MD Consult Regarding low-cost insulin regimen for discharge  Diabetes history: Type 2 Outpatient Diabetes medications: Regular Insulin 10 units tid with meals Current orders for Inpatient glycemic control: Lantus 20 units at HS, Novolog meal coverage 7 units tid, Novolog Moderate correction  Note: Poor glycemic control as reflected by A1C of 8.8.  Patient's insurance does not seem to cover newer insulins well.  Patient could best afford generic Novolin/ReliOn 70/30 insulin from [facility].  Started on Lantus last night.  Has gotten meal coverage twice today.  So far today has reguired 10 units of additional correction Novolog with CBG's in the 200's.  Could switch patient to 70/30 starting with 25 units bid at breakfast and supper and titrate upwards according to CBG's.  Checked with the Education officer, museum, and [employer] will fill Rx through [employer]; however, she will probably be better off if a friend or family gets her insulin at [facility] so it will be about $25/vial.  Thank you for the consult.  Kailynne Ferrington S. Marcelline Mates, RN, CNS, CDE Inpatient Diabetes Program, team pager [phone]

## 2013-11-06 NOTE — Progress Notes (Signed)
Pharmacy: Brief Note - Vancomycin   Weight updated to 123 kg.  Will increase vancomycin dose to 1250 mg IV q12h, check vancomycin trough at [facility].  Montior renal function.  F/u cultures.   BorgerdingGaye Alken PharmD Pager #: [phone] 1:15 PM 11/06/2013

## 2013-11-07 DIAGNOSIS — I10 Essential (primary) hypertension: Secondary | ICD-10-CM | POA: Diagnosis not present

## 2013-11-07 DIAGNOSIS — E119 Type 2 diabetes mellitus without complications: Secondary | ICD-10-CM | POA: Diagnosis not present

## 2013-11-07 DIAGNOSIS — D500 Iron deficiency anemia secondary to blood loss (chronic): Secondary | ICD-10-CM | POA: Diagnosis not present

## 2013-11-07 DIAGNOSIS — K5900 Constipation, unspecified: Secondary | ICD-10-CM

## 2013-11-07 DIAGNOSIS — Z89529 Acquired absence of unspecified knee: Secondary | ICD-10-CM | POA: Diagnosis not present

## 2013-11-07 DIAGNOSIS — Z5181 Encounter for therapeutic drug level monitoring: Secondary | ICD-10-CM | POA: Diagnosis not present

## 2013-11-07 DIAGNOSIS — T8450XA Infection and inflammatory reaction due to unspecified internal joint prosthesis, initial encounter: Secondary | ICD-10-CM | POA: Diagnosis not present

## 2013-11-07 DIAGNOSIS — Z96659 Presence of unspecified artificial knee joint: Secondary | ICD-10-CM | POA: Diagnosis not present

## 2013-11-07 LAB — CBC
HEMATOCRIT: 26.4 % — AB (ref 36.0–46.0)
HEMOGLOBIN: 8.9 g/dL — AB (ref 12.0–15.0)
MCH: 28.6 pg (ref 26.0–34.0)
MCHC: 33.7 g/dL (ref 30.0–36.0)
MCV: 84.9 fL (ref 78.0–100.0)
Platelets: 154 10*3/uL (ref 150–400)
RBC: 3.11 MIL/uL — ABNORMAL LOW (ref 3.87–5.11)
RDW: 13.1 % (ref 11.5–15.5)
WBC: 8 10*3/uL (ref 4.0–10.5)

## 2013-11-07 LAB — BASIC METABOLIC PANEL
BUN: 20 mg/dL (ref 6–23)
CHLORIDE: 97 meq/L (ref 96–112)
CO2: 25 meq/L (ref 19–32)
Calcium: 8.6 mg/dL (ref 8.4–10.5)
Creatinine, Ser: 1.36 mg/dL — ABNORMAL HIGH (ref 0.50–1.10)
GFR calc Af Amer: 47 mL/min — ABNORMAL LOW (ref >90)
GFR calc non Af Amer: 40 mL/min — ABNORMAL LOW (ref >90)
GLUCOSE: 197 mg/dL — AB (ref 70–99)
Potassium: 3.7 mEq/L (ref 3.7–5.3)
Sodium: 134 mEq/L — ABNORMAL LOW (ref 137–147)

## 2013-11-07 LAB — SEDIMENTATION RATE: SED RATE: 70 mm/h — AB (ref 0–22)

## 2013-11-07 LAB — PROTIME-INR
INR: 1.74 — ABNORMAL HIGH (ref 0.00–1.49)
Prothrombin Time: 19.8 seconds — ABNORMAL HIGH (ref 11.6–15.2)

## 2013-11-07 LAB — VANCOMYCIN, TROUGH: Vancomycin Tr: 31.1 ug/mL (ref 10.0–20.0)

## 2013-11-07 LAB — GLUCOSE, CAPILLARY
GLUCOSE-CAPILLARY: 147 mg/dL — AB (ref 70–99)
Glucose-Capillary: 121 mg/dL — ABNORMAL HIGH (ref 70–99)
Glucose-Capillary: 168 mg/dL — ABNORMAL HIGH (ref 70–99)
Glucose-Capillary: 185 mg/dL — ABNORMAL HIGH (ref 70–99)
Glucose-Capillary: 214 mg/dL — ABNORMAL HIGH (ref 70–99)

## 2013-11-07 LAB — C-REACTIVE PROTEIN: CRP: 5.2 mg/dL — ABNORMAL HIGH (ref <0.60)

## 2013-11-07 MED ORDER — METOCLOPRAMIDE HCL 5 MG PO TABS
5.0000 mg | ORAL_TABLET | Freq: Every day | ORAL | Status: DC
  Administered 2013-11-08: 5 mg via ORAL
  Filled 2013-11-07 (×2): qty 1

## 2013-11-07 MED ORDER — SENNA 8.6 MG PO TABS: 2.0000 | ORAL_TABLET | Freq: Every day | ORAL | Status: DC

## 2013-11-07 MED ORDER — METOPROLOL TARTRATE 25 MG PO TABS
25.0000 mg | ORAL_TABLET | Freq: Two times a day (BID) | ORAL | Status: DC
  Administered 2013-11-07 – 2013-11-08 (×2): 25 mg via ORAL
  Filled 2013-11-07 (×3): qty 1

## 2013-11-07 MED ORDER — SODIUM CHLORIDE 0.9 % IV SOLN
INTRAVENOUS | Status: DC
  Administered 2013-11-07: 18:00:00 via INTRAVENOUS

## 2013-11-07 MED ORDER — WARFARIN SODIUM 2.5 MG PO TABS
2.5000 mg | ORAL_TABLET | Freq: Once | ORAL | Status: AC
  Administered 2013-11-07: 2.5 mg via ORAL
  Filled 2013-11-07: qty 1

## 2013-11-07 MED ORDER — MAGNESIUM CITRATE PO SOLN
1.0000 | Freq: Once | ORAL | Status: AC
  Administered 2013-11-07: 1 via ORAL

## 2013-11-07 NOTE — Progress Notes (Signed)
ANTICOAGULATION CONSULT NOTE - Follow Up Consult  Pharmacy Consult for warfarin Indication: atrial fibrillation  Allergies  Allergen Reactions  . Levofloxacin Itching  . Morphine And Related Itching   Labs:  Recent Labs  11/05/13 1140 11/06/13 0410 11/07/13 0505  HGB  --  11.7* 8.9*  HCT  --  33.9* 26.4*  PLT  --  203 154  LABPROT 13.4 14.2 19.8*  INR 1.04 1.12 1.74*  CREATININE  --  0.80 1.36*    Assessment: 35 yoF admitted for infected TKA s/p resection and antibiotic spacer placement 2/9. Pt on chronic coumadin for hx PAF and pharmacy consulted to dose inpatient with lovenox bridge until INR >1.8. Note pt also on ASA 81mg  and PRN ibuprofen PTA. INR 1.04 day of OR. Pt was reportedly on warfarin 7.5mg  daily except 3.75mg  on Mon/Fri PTA.   INR trended up to 1.74, Hgb down to 8.9, Scr up, ptls dropping but still wnl range. Lovenox 40mg  q12h until INR > 1.8. No bleeding noted.  Goal of Therapy:  INR 2-3 Monitor platelets by anticoagulation protocol: Yes   Plan:   Warfarin 2.5mg  tonight (low dose given large rise in INR)  MD, although comment noted to d/c Lovenox when INR > 1.8, pharmacy will d/c Lovenox today with INR of 1.74 given expected rise, fairly close to goal, and current CBC  Daily PT/INR  Pharmacy will f/u  Vanessa Garrison, PharmD, BCPS Pager: [phone] 8:32 AM Pharmacy #: [phone]

## 2013-11-07 NOTE — Progress Notes (Signed)
[facility]    Date of Admission:  11/05/2013   Total days of antibiotics 3        Day 3 vanco        Day 2 ceftriaxone           ID: [name] is a 63 y.o. female with culture negative PJI s/p HW removal and antibiotic spacer placement Principal Problem:   Left knee spacer, following removal of joint prosthesis Active Problems:   Morbid obesity   Type II or unspecified type diabetes mellitus without mention of complication, uncontrolled   Expected blood loss anemia    Subjective: Afebrile . Reported to have nausea today  24hr: cr elevated this morning and vanco trough supratherapeutic at 31  Medications:  . aspirin EC  81 mg Oral QPM  . cefTRIAXone (ROCEPHIN)  IV  2 g Intravenous Q24H  . celecoxib  200 mg Oral Q12H  . cholecalciferol  1,000 Units Oral QHS  . docusate sodium  100 mg Oral BID  . ferrous sulfate  325 mg Oral TID PC  . hydrochlorothiazide  25 mg Oral Daily  . HYDROcodone-acetaminophen  1-2 tablet Oral Q4H  . insulin aspart  0-5 Units Subcutaneous QHS  . insulin aspart  0-9 Units Subcutaneous TID WC  . insulin aspart protamine- aspart  25 Units Subcutaneous BID WC  . lisinopril  20 mg Oral Daily  . metoprolol  50 mg Oral BID  . pantoprazole  80 mg Oral Daily  . polyethylene glycol  17 g Oral BID  . simvastatin  40 mg Oral QPM  . warfarin  2.5 mg Oral ONCE-1800  . Warfarin - Pharmacist Dosing Inpatient   Does not apply q1800    Objective: Vital signs in last 24 hours: Temp:  [97.6 F (36.4 C)-98 F (36.7 C)] 98 F (36.7 C) (02/11 0533) Pulse Rate:  [71-81] 81 (02/11 0533) Resp:  [15-18] 18 (02/11 0800) BP: (97-152)/(60-72) 97/60 mmHg (02/11 0533) SpO2:  [95 %-100 %] 95 % (02/11 0533) Physical Exam  Constitutional:  oriented to person, place, and time. appears well-developed and well-nourished. No distress.  HENT:  Mouth/Throat: Oropharynx is clear and moist. No oropharyngeal exudate.  Cardiovascular: Normal  rate, regular rhythm and normal heart sounds. Exam reveals no gallop and no friction rub.  No murmur heard.  Pulmonary/Chest: Effort normal and breath sounds normal. No respiratory distress.  has no wheezes.  Abdominal: Soft. Bowel sounds are normal.  exhibits no distension. There is no tenderness.  Lymphadenopathy: no cervical adenopathy.  Neurological: alert and oriented to person, place, and time.  Skin: Skin is warm and dry. No rash noted. No erythema.      Lab Results  Recent Labs  11/06/13 0410 11/07/13 0505  WBC 15.7* 8.0  HGB 11.7* 8.9*  HCT 33.9* 26.4*  NA 135* 134*  K 4.5 3.7  CL 97 97  CO2 26 25  BUN 15 20  CREATININE 0.80 1.36*   Liver Panel No results found for this basename: PROT, ALBUMIN, AST, ALT, ALKPHOS, BILITOT, BILIDIR, IBILI,  in the last 72 hours Sedimentation Rate  Recent Labs  11/07/13 0505  ESRSEDRATE 70*   C-Reactive Protein  Recent Labs  11/07/13 0505  CRP 5.2*    Microbiology: 2/9 or culture: NGTD Studies/Results: No results found.   Assessment/Plan: Culture negative prosthetic joint infection = continue with vancomycin and ceftriaxone  aki = currently vancomycin on hold due to being supratherapeutic, which could also  be causing the aki. Avoid nephrotoxic meds  supratherapeutic vanco level = random level at 31, optimally would have it<20. Pharmacy is monitoring. Continue vanco to be on hold  [facility] for Infectious Diseases Cell: [phone] Pager: [phone]  11/07/2013, 12:24 PM

## 2013-11-07 NOTE — Progress Notes (Signed)
Patient complaining of "feeling yucky", and has been nauseated this am. IV 10mg  Reglan given this morning.   Patient did not eat breakfast, and only ate a small portion of her lunch.   RN called PA to make him aware of patients complaints, and nursing objective assessment.  PA informed RN to keep monitoring patient for now and see if one more dose of reglan helps her, and to try and increase the patients mobility.

## 2013-11-07 NOTE — Care Management Note (Signed)
    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   (If response is "NO", the following Medicare IM given date fields will be blank) Date Medicare IM given:   Date Additional Medicare IM given:    Discharge Disposition:    Per UR Regulation:    If discussed at Long Length of Stay Meetings, dates discussed:    Comments:

## 2013-11-07 NOTE — Progress Notes (Signed)
[facility]   [facility] is providing the following services: Patient declined rw and commode.  Already has at home.   If patient discharges after hours, please call [phone].   Linward Headland 11/07/2013, 11:09 AM

## 2013-11-07 NOTE — Progress Notes (Signed)
ANTIBIOTIC CONSULT NOTE - FOLLOW UP  Pharmacy Consult for Vancomycin Indication: presumed prosthetic joint infection  Allergies  Allergen Reactions  . Levofloxacin Itching  . Morphine And Related Itching    Recent Labs  11/06/13 0410 11/07/13 0505  WBC 15.7* 8.0  HGB 11.7* 8.9*  PLT 203 154  CREATININE 0.80 1.36*   Estimated Creatinine Clearance: 53.3 ml/min (by C-G formula based on Cr of 1.36).  Recent Labs  11/07/13 0755  VANCOTROUGH 31.1*     Assessment: 37 yoF with infected left TKR with arthrofibrosis s/p resection and antibiotic spacer placement 2/9. Pharmacy consulted to dose vancomycin. Ancef and vancomycin given preop.   2/9 Ancef, Vanc x 1 pre-op 2/9 Tobra 6g + Vanc 5g (abx spacer) 2/9 >> Vancomycin >> 2/10 >> Ceftriaxone (ID) >>  Tmax: Afeb WBC: Wnl starting 2/11 Renal: Scr up to 1.36, CG 53, N 47  2/9 Synovial L Knee GS >> No organisms seen 2/9 Synovial L knee >> NGTD  2/9 Synovial L knee (anaerobic) >> NGTD  Today is D3 Vancomycin, D2 Rocephin 2g daily per ID for presumed prosthetic join infection. ID recommended to treat as culture negative PJI, PICC placed, plan for 6 weeks of antibiotics. Scr bumped this AM, vancomycin was discontinued from [facility] and a trough (not at steady state) resulted to be 31.1, supratherapeutic.   Goal of Therapy:  Vancomycin trough level 15-20 mcg/ml  Plan:   Continue to hold Vancomycin  Random vancomycin level in AM and redose from there  F/u plans for discharge   Vanessa [place], PharmD, BCPS Pager: [phone] 9:06 AM Pharmacy #: [id number]

## 2013-11-07 NOTE — Progress Notes (Signed)
CRITICAL VALUE ALERT  Critical value received: Vanc Trough 31.1 (normal 10.1-20.1)  Date of notification:  11/07/2013  Time of notification: 0854  Critical value read back:yes  Nurse who received alert:  Benedetto Goad, RN  MD notified (1st page):  Adrian Prince, PA  Time of first page:  [phone]  MD notified (2nd page):NA  Time of second page:NA  Responding MD:  Adrian Prince, PA  Time MD responded: 0900

## 2013-11-07 NOTE — Progress Notes (Signed)
PT Cancellation Note  Patient Details Name: [name] MRN: [id number] DOB: April 12, 1951   Cancelled Treatment:     Pt declined to participate at this time due to nausea. Will check back later today as schedule permits. Thanks.    Weston Anna, MPT Pager: [phone]

## 2013-11-07 NOTE — Progress Notes (Signed)
PT Cancellation Note  Patient Details Name: [name] MRN: [id number] DOB: 1950/11/19   Cancelled Treatment:    Reason Eval/Treat Not Completed: Pt declined to participate today-not feeling well/up to therapy. Requested PT check back tomorrow.    Weston Anna, MPT Pager: [phone]

## 2013-11-07 NOTE — Progress Notes (Signed)
CSW assisting with d/c planning. [name] is unable to accept pt for [facility]. Pt is aware . CSW has initiated new SNF search and will provide bed offers this afternoon. Pt does not have a secondary insurance and will require more than 20 days at [facility]. Pt will need SNF that will arrange payment plan for copayment days at facility.  Werner Lean LCSW [phone]

## 2013-11-07 NOTE — Progress Notes (Signed)
[facility] PROGRESS NOTE  [name] WGN:[id number] DOB: 08-11-1951 DOA: 11/05/2013 PCP: Georgianne Fick, MD  HPI/Brief narrative  63 year old female with history of type II DM/IDDM, HTN, obesity, OSA, GERD, CAD status post stent, PAF on Coumadin, status post several left knee procedures, admitted by orthopedic service for chronic left knee prosthetic joint infection, status post hardware removal and antibiotic spacer placement and Hospitalists were consulted for assistance in diabetes management.  Assessment/Plan  Uncontrolled type II DM/IDDM  Hemoglobin A1c: 8.7   AKI, likely secondary to dehydration.  Vancomycin level only moderately elevated -  Hold vancomycin and dose by level per pharmacy -  Hold ACEI and HCTZ -  IVF -  further evaluation with renal ultrasound [place] tomorrow if creatinine not improving with IV fluids  CAD stable.  Continue aspirin, beta blockers and statins.   Paroxysmal A. Fib, rate controlled -  Continue beta blockers and anticoagulation with Coumadin, dosed per pharmacy  Hypertension, currently hypotensive -  Hold HCTZ and lisinopril -  Decrease metoprolol and place hold parameter  GERD stable, Continue PPI   Acute blood loss anemia, hgb trending down, likely post-operative - repeat CBC in AM -  tx for Hgb < 7 -  Occult stool -  Continue iron   Diet:  Diabetic Access:  PIV IVF:  Yes  Proph:  Coumadin  Code Status: Full Family Communication: Patient alone Disposition Plan: Pending improvement in nausea, blood pressure, kidney function   Consultants:  Orthopedics, Dr. Charlann Boxer  Procedures:  Left total knee arthroplasty resection with placement of antibiotic spacer 2/9  Antibiotics:  Vancomycin from 2/9   Cefazolin on 2/9 followed by Ceftriaxone on 2/10  HPI/Subjective:  Patient states that she is constipated and has some nausea without vomiting. She denies fevers and chills. Her left knee is still painful particularly  with movement. She denies cough, dysuria  Objective: Filed Vitals:   11/06/13 2122 11/07/13 0533 11/07/13 0800 11/07/13 1200  BP: 152/72 97/60    Pulse: 81 81    Temp: 98 F (36.7 C) 98 F (36.7 C)    TempSrc: Oral Oral    Resp: 16 15 18 16   Height:      Weight:      SpO2: 95% 95%      Intake/Output Summary (Last 24 hours) at 11/07/13 1406 Last data filed at 11/07/13 0900  Gross per 24 hour  Intake    720 ml  Output    401 ml  Net    319 ml   Filed Weights   11/05/13 2002  Weight: 123.3 kg (271 lb 13.2 oz)    Exam:   General:  BF, No acute distress  HEENT:  NCAT, MMM  Cardiovascular:  RRR, nl S1, S2 no mrg, 2+ pulses, warm extremities  Respiratory:  CTAB, no increased WOB  Abdomen:   NABS, soft, NT/ND  MSK:   Normal tone and bulk, left knee with Ace bandage clean, dry, and intact  Neuro:  Grossly intact  Data Reviewed: Basic Metabolic Panel:  Recent Labs Lab 11/06/13 0410 11/07/13 0505  NA 135* 134*  K 4.5 3.7  CL 97 97  CO2 26 25  GLUCOSE 261* 197*  BUN 15 20  CREATININE 0.80 1.36*  CALCIUM 8.8 8.6   Liver Function Tests: No results found for this basename: AST, ALT, ALKPHOS, BILITOT, PROT, ALBUMIN,  in the last 168 hours No results found for this basename: LIPASE, AMYLASE,  in the last 168 hours No results found for this  basename: AMMONIA,  in the last 168 hours CBC:  Recent Labs Lab 11/06/13 0410 11/07/13 0505  WBC 15.7* 8.0  HGB 11.7* 8.9*  HCT 33.9* 26.4*  MCV 84.5 84.9  PLT 203 154   Cardiac Enzymes: No results found for this basename: CKTOTAL, CKMB, CKMBINDEX, TROPONINI,  in the last 168 hours BNP (last 3 results) No results found for this basename: PROBNP,  in the last 8760 hours CBG:  Recent Labs Lab 11/06/13 1152 11/06/13 1730 11/06/13 2117 11/07/13 0718 11/07/13 1212  GLUCAP 224* 103* 222* 185* 214*    Recent Results (from the past 240 hour(s))  SURGICAL PCR SCREEN     Status: None   Collection Time     10/30/13  2:52 PM      Result Value Ref Range Status   MRSA, PCR NEGATIVE  NEGATIVE Final   Staphylococcus aureus NEGATIVE  NEGATIVE Final   Comment:            The Xpert SA Assay (FDA     approved for NASAL specimens     in patients over 54 years of age),     is one component of     a comprehensive surveillance     program.  Test performance has     been validated by [employer] for patients greater     than or equal to 59 year old.     It is not intended     to diagnose infection nor to     guide or monitor treatment.  GRAM STAIN     Status: None   Collection Time    11/05/13  4:15 PM      Result Value Ref Range Status   Specimen Description SYNOVIAL LEFT KNEE   Final   Special Requests NONE   Final   Gram Stain     Final   Value: NO ORGANISMS SEEN     NO WBC SEEN     NO SQUAMOUS EPITHELIAL CELLS SEEN     Gram Stain Report Called to,Read Back By and Verified With: ANDERSON,C AT 1816 ON [id number] BY POTEAT,S   Report Status 11/05/2013 FINAL   Final  ANAEROBIC CULTURE     Status: None   Collection Time    11/05/13  4:15 PM      Result Value Ref Range Status   Specimen Description SYNOVIAL LEFT KNEE   Final   Special Requests NONE   Final   Gram Stain     Final   Value: RARE WBC PRESENT, PREDOMINANTLY PMN     NO ORGANISMS SEEN     Performed at [facility]   Culture     Final   Value: NO ANAEROBES ISOLATED; CULTURE IN PROGRESS FOR 5 DAYS     Performed at [facility]   Report Status PENDING   Incomplete  BODY FLUID CULTURE     Status: None   Collection Time    11/05/13  4:15 PM      Result Value Ref Range Status   Specimen Description SYNOVIAL LEFT KNEE   Final   Special Requests NONE   Final   Gram Stain     Final   Value: RARE WBC PRESENT, PREDOMINANTLY PMN     NO ORGANISMS SEEN     Performed at [facility]   Culture     Final   Value: NO GROWTH 1 DAY     Performed at [facility]   Report Status PENDING   Incomplete      Studies: No results found.  Scheduled Meds: . aspirin EC  81 mg Oral QPM  . cefTRIAXone (ROCEPHIN)  IV  2 g Intravenous Q24H  . celecoxib  200 mg Oral Q12H  . cholecalciferol  1,000 Units Oral QHS  . docusate sodium  100 mg Oral BID  . ferrous sulfate  325 mg Oral TID PC  . hydrochlorothiazide  25 mg Oral Daily  . HYDROcodone-acetaminophen  1-2 tablet Oral Q4H  . insulin aspart  0-5 Units Subcutaneous QHS  . insulin aspart  0-9 Units Subcutaneous TID WC  . insulin aspart protamine- aspart  25 Units Subcutaneous BID WC  . lisinopril  20 mg Oral Daily  . metoprolol  50 mg Oral BID  . pantoprazole  80 mg Oral Daily  . polyethylene glycol  17 g Oral BID  . simvastatin  40 mg Oral QPM  . warfarin  2.5 mg Oral ONCE-1800  . Warfarin - Pharmacist Dosing Inpatient   Does not apply q1800   Continuous Infusions: . sodium chloride      Principal Problem:   Left knee spacer, following removal of joint prosthesis Active Problems:   Morbid obesity   Type II or unspecified type diabetes mellitus without mention of complication, uncontrolled   Expected blood loss anemia    Time spent: 30 min    Richy Spradley, [facility]  [facility] Pager [phone]. If 7PM-7AM, please contact night-coverage at [email or web address], password [facility] 11/07/2013, 2:06 PM  LOS: 2 days

## 2013-11-08 DIAGNOSIS — G4730 Sleep apnea, unspecified: Secondary | ICD-10-CM | POA: Diagnosis present

## 2013-11-08 DIAGNOSIS — Z885 Allergy status to narcotic agent status: Secondary | ICD-10-CM | POA: Diagnosis not present

## 2013-11-08 DIAGNOSIS — I252 Old myocardial infarction: Secondary | ICD-10-CM | POA: Diagnosis not present

## 2013-11-08 DIAGNOSIS — M25569 Pain in unspecified knee: Secondary | ICD-10-CM | POA: Diagnosis not present

## 2013-11-08 DIAGNOSIS — S99919A Unspecified injury of unspecified ankle, initial encounter: Secondary | ICD-10-CM | POA: Diagnosis not present

## 2013-11-08 DIAGNOSIS — N179 Acute kidney failure, unspecified: Secondary | ICD-10-CM | POA: Diagnosis not present

## 2013-11-08 DIAGNOSIS — R262 Difficulty in walking, not elsewhere classified: Secondary | ICD-10-CM | POA: Diagnosis not present

## 2013-11-08 DIAGNOSIS — Z89529 Acquired absence of unspecified knee: Secondary | ICD-10-CM | POA: Diagnosis not present

## 2013-11-08 DIAGNOSIS — Z452 Encounter for adjustment and management of vascular access device: Secondary | ICD-10-CM | POA: Diagnosis not present

## 2013-11-08 DIAGNOSIS — Z79899 Other long term (current) drug therapy: Secondary | ICD-10-CM | POA: Diagnosis not present

## 2013-11-08 DIAGNOSIS — R110 Nausea: Secondary | ICD-10-CM | POA: Diagnosis not present

## 2013-11-08 DIAGNOSIS — M129 Arthropathy, unspecified: Secondary | ICD-10-CM | POA: Diagnosis present

## 2013-11-08 DIAGNOSIS — R918 Other nonspecific abnormal finding of lung field: Secondary | ICD-10-CM | POA: Diagnosis not present

## 2013-11-08 DIAGNOSIS — S8990XA Unspecified injury of unspecified lower leg, initial encounter: Secondary | ICD-10-CM | POA: Diagnosis not present

## 2013-11-08 DIAGNOSIS — I10 Essential (primary) hypertension: Secondary | ICD-10-CM | POA: Diagnosis not present

## 2013-11-08 DIAGNOSIS — I739 Peripheral vascular disease, unspecified: Secondary | ICD-10-CM | POA: Diagnosis present

## 2013-11-08 DIAGNOSIS — D649 Anemia, unspecified: Secondary | ICD-10-CM | POA: Diagnosis not present

## 2013-11-08 DIAGNOSIS — Z01812 Encounter for preprocedural laboratory examination: Secondary | ICD-10-CM | POA: Diagnosis not present

## 2013-11-08 DIAGNOSIS — R002 Palpitations: Secondary | ICD-10-CM | POA: Diagnosis present

## 2013-11-08 DIAGNOSIS — D62 Acute posthemorrhagic anemia: Secondary | ICD-10-CM | POA: Diagnosis not present

## 2013-11-08 DIAGNOSIS — K219 Gastro-esophageal reflux disease without esophagitis: Secondary | ICD-10-CM | POA: Diagnosis present

## 2013-11-08 DIAGNOSIS — E039 Hypothyroidism, unspecified: Secondary | ICD-10-CM | POA: Diagnosis not present

## 2013-11-08 DIAGNOSIS — I4891 Unspecified atrial fibrillation: Secondary | ICD-10-CM | POA: Diagnosis not present

## 2013-11-08 DIAGNOSIS — Z96659 Presence of unspecified artificial knee joint: Secondary | ICD-10-CM | POA: Diagnosis not present

## 2013-11-08 DIAGNOSIS — M009 Pyogenic arthritis, unspecified: Secondary | ICD-10-CM | POA: Diagnosis not present

## 2013-11-08 DIAGNOSIS — Z794 Long term (current) use of insulin: Secondary | ICD-10-CM | POA: Diagnosis not present

## 2013-11-08 DIAGNOSIS — Z85118 Personal history of other malignant neoplasm of bronchus and lung: Secondary | ICD-10-CM | POA: Diagnosis not present

## 2013-11-08 DIAGNOSIS — R700 Elevated erythrocyte sedimentation rate: Secondary | ICD-10-CM | POA: Diagnosis not present

## 2013-11-08 DIAGNOSIS — K5900 Constipation, unspecified: Secondary | ICD-10-CM | POA: Diagnosis not present

## 2013-11-08 DIAGNOSIS — IMO0001 Reserved for inherently not codable concepts without codable children: Secondary | ICD-10-CM | POA: Diagnosis not present

## 2013-11-08 DIAGNOSIS — D500 Iron deficiency anemia secondary to blood loss (chronic): Secondary | ICD-10-CM | POA: Diagnosis not present

## 2013-11-08 DIAGNOSIS — M21869 Other specified acquired deformities of unspecified lower leg: Secondary | ICD-10-CM | POA: Diagnosis not present

## 2013-11-08 DIAGNOSIS — E7800 Pure hypercholesterolemia, unspecified: Secondary | ICD-10-CM | POA: Diagnosis present

## 2013-11-08 DIAGNOSIS — Z5181 Encounter for therapeutic drug level monitoring: Secondary | ICD-10-CM | POA: Diagnosis not present

## 2013-11-08 DIAGNOSIS — Z7901 Long term (current) use of anticoagulants: Secondary | ICD-10-CM | POA: Diagnosis not present

## 2013-11-08 DIAGNOSIS — M6281 Muscle weakness (generalized): Secondary | ICD-10-CM | POA: Diagnosis not present

## 2013-11-08 DIAGNOSIS — Z881 Allergy status to other antibiotic agents status: Secondary | ICD-10-CM | POA: Diagnosis not present

## 2013-11-08 DIAGNOSIS — M869 Osteomyelitis, unspecified: Secondary | ICD-10-CM | POA: Diagnosis not present

## 2013-11-08 DIAGNOSIS — Z8249 Family history of ischemic heart disease and other diseases of the circulatory system: Secondary | ICD-10-CM | POA: Diagnosis not present

## 2013-11-08 DIAGNOSIS — Z87891 Personal history of nicotine dependence: Secondary | ICD-10-CM | POA: Diagnosis not present

## 2013-11-08 DIAGNOSIS — I2109 ST elevation (STEMI) myocardial infarction involving other coronary artery of anterior wall: Secondary | ICD-10-CM | POA: Diagnosis not present

## 2013-11-08 DIAGNOSIS — Z6841 Body Mass Index (BMI) 40.0 and over, adult: Secondary | ICD-10-CM | POA: Diagnosis not present

## 2013-11-08 DIAGNOSIS — I2510 Atherosclerotic heart disease of native coronary artery without angina pectoris: Secondary | ICD-10-CM | POA: Diagnosis not present

## 2013-11-08 DIAGNOSIS — L089 Local infection of the skin and subcutaneous tissue, unspecified: Secondary | ICD-10-CM | POA: Diagnosis not present

## 2013-11-08 DIAGNOSIS — T8450XA Infection and inflammatory reaction due to unspecified internal joint prosthesis, initial encounter: Secondary | ICD-10-CM | POA: Diagnosis not present

## 2013-11-08 DIAGNOSIS — E119 Type 2 diabetes mellitus without complications: Secondary | ICD-10-CM | POA: Diagnosis not present

## 2013-11-08 LAB — BASIC METABOLIC PANEL
BUN: 21 mg/dL (ref 6–23)
CHLORIDE: 98 meq/L (ref 96–112)
CO2: 26 meq/L (ref 19–32)
Calcium: 8.4 mg/dL (ref 8.4–10.5)
Creatinine, Ser: 1.25 mg/dL — ABNORMAL HIGH (ref 0.50–1.10)
GFR calc Af Amer: 52 mL/min — ABNORMAL LOW (ref >90)
GFR calc non Af Amer: 44 mL/min — ABNORMAL LOW (ref >90)
GLUCOSE: 140 mg/dL — AB (ref 70–99)
Potassium: 3.5 mEq/L — ABNORMAL LOW (ref 3.7–5.3)
SODIUM: 133 meq/L — AB (ref 137–147)

## 2013-11-08 LAB — CBC
HEMATOCRIT: 26.1 % — AB (ref 36.0–46.0)
HEMOGLOBIN: 8.9 g/dL — AB (ref 12.0–15.0)
MCH: 28.9 pg (ref 26.0–34.0)
MCHC: 34.1 g/dL (ref 30.0–36.0)
MCV: 84.7 fL (ref 78.0–100.0)
Platelets: 170 10*3/uL (ref 150–400)
RBC: 3.08 MIL/uL — AB (ref 3.87–5.11)
RDW: 13.3 % (ref 11.5–15.5)
WBC: 7.9 10*3/uL (ref 4.0–10.5)

## 2013-11-08 LAB — GLUCOSE, CAPILLARY
GLUCOSE-CAPILLARY: 112 mg/dL — AB (ref 70–99)
GLUCOSE-CAPILLARY: 121 mg/dL — AB (ref 70–99)
Glucose-Capillary: 121 mg/dL — ABNORMAL HIGH (ref 70–99)

## 2013-11-08 LAB — PROTIME-INR
INR: 2 — AB (ref 0.00–1.49)
Prothrombin Time: 22.1 seconds — ABNORMAL HIGH (ref 11.6–15.2)

## 2013-11-08 LAB — OCCULT BLOOD X 1 CARD TO LAB, STOOL: Fecal Occult Bld: POSITIVE — AB

## 2013-11-08 LAB — VANCOMYCIN, RANDOM: Vancomycin Rm: 16.4 ug/mL

## 2013-11-08 MED ORDER — FERROUS SULFATE 325 (65 FE) MG PO TABS: 325.0000 mg | ORAL_TABLET | Freq: Three times a day (TID) | ORAL | Status: DC

## 2013-11-08 MED ORDER — POLYETHYLENE GLYCOL 3350 17 G PO PACK: 17.0000 g | PACK | Freq: Two times a day (BID) | ORAL | Status: DC

## 2013-11-08 MED ORDER — DEXTROSE 5 % IV SOLN: 2.0000 g | INTRAVENOUS | Status: DC

## 2013-11-08 MED ORDER — WARFARIN SODIUM 5 MG PO TABS: 5.0000 mg | ORAL_TABLET | Freq: Once | ORAL | Status: DC

## 2013-11-08 MED ORDER — VANCOMYCIN HCL 10 G IV SOLR: 1250.0000 mg | INTRAVENOUS | Status: DC

## 2013-11-08 MED ORDER — INSULIN ASPART PROT & ASPART (70-30 MIX) 100 UNIT/ML ~~LOC~~ SUSP: 25.0000 [IU] | Freq: Two times a day (BID) | SUBCUTANEOUS | Status: DC

## 2013-11-08 MED ORDER — HYDROCODONE-ACETAMINOPHEN 7.5-325 MG PO TABS: 1.0000 | ORAL_TABLET | ORAL | Status: DC | PRN

## 2013-11-08 MED ORDER — WARFARIN SODIUM 5 MG PO TABS
5.0000 mg | ORAL_TABLET | Freq: Once | ORAL | Status: DC
  Filled 2013-11-08: qty 1

## 2013-11-08 MED ORDER — VANCOMYCIN HCL 10 G IV SOLR
1250.0000 mg | INTRAVENOUS | Status: DC
  Administered 2013-11-08: 1250 mg via INTRAVENOUS
  Filled 2013-11-08: qty 1250

## 2013-11-08 MED ORDER — DSS 100 MG PO CAPS: 100.0000 mg | ORAL_CAPSULE | Freq: Two times a day (BID) | ORAL | Status: DC

## 2013-11-08 MED ORDER — TIZANIDINE HCL 4 MG PO TABS: 4.0000 mg | ORAL_TABLET | Freq: Three times a day (TID) | ORAL | Status: DC | PRN

## 2013-11-08 NOTE — Progress Notes (Signed)
Clinical Social Work Department CLINICAL SOCIAL WORK PLACEMENT NOTE 11/08/2013  Patient:  [name], [name]  Account Number:  [id number] Admit date:  11/05/2013  Clinical Social Worker:  Werner Lean, LCSW  Date/time:  11/06/2013 01:51 PM  Clinical Social Work is seeking post-discharge placement for this patient at the following level of care:   SKILLED NURSING   (*CSW will update this form in Epic as items are completed)     Patient/family provided with [facility] Department of Clinical Social Work's list of facilities offering this level of care within the geographic area requested by the patient (or if unable, by the patient's family).  11/06/2013  Patient/family informed of their freedom to choose among providers that offer the needed level of care, that participate in Medicare, Medicaid or managed care program needed by the patient, have an available bed and are willing to accept the patient.    Patient/family informed of [facility]' ownership interest in [facility], as well as of the fact that they are under no obligation to receive care at this facility.  PASARR submitted to EDS on  PASARR number received from EDS on 07/11/2012  FL2 transmitted to all facilities in geographic area requested by pt/family on  11/06/2013 FL2 transmitted to all facilities within larger geographic area on 11/06/2013  Patient informed that his/her managed care company has contracts with or will negotiate with  certain facilities, including the following:     Patient/family informed of bed offers received:  11/06/2013 Patient chooses bed at [facility] Physician recommends and patient chooses bed at    Patient to be transferred to [facility] on  11/08/2013 Patient to be transferred to facility by [facility]  The following physician request were entered in Epic:   Additional Comments:  Werner Lean LCSW [phone]

## 2013-11-08 NOTE — Progress Notes (Addendum)
[facility] PROGRESS NOTE  Brandi PETTREY OAC:[id number] DOB: 12/01/1950 DOA: 11/05/2013 PCP: Georgianne Fick, MD   Discharge recommendations: Vancomycin 1250mg  IV q24h, last dose given 10AM 2/12 -  Check vancomycin trough level at 9:30AM on 2/13, goal vanc trough of 15-20 -  If vanco dose changes, then trough should be checked before 4th dose of new dosage change and not wait for weekly blood draw. -  F/u in ID clinic in 4-6 weeks (prior to cessation of antibiotics) -  Twice weekly creatinine and once weekly vancomycin troughs or more frequently if needed   Assessment/Plan  Septic arthritis of the left knee s/p removal of hardware 2/9.  Infectious disease recommending 6 weeks of IV therapy via PICC line, through 12/16/13, then stop.   -  Continue vancomycin and ceftriaxone  -  Vancomycin 1250mg  IV q24h, last dose given 10AM 2/12 -  Check vancomycin trough level at 9:30AM on 2/13, goal vanc trough of 15-20 -  If vanco dose changes, then trough should be checked before 4th dose of new dosage change and not wait for weekly blood draw. -  F/u in ID clinic in 4-6 weeks (prior to cessation of antibiotics) -  F/u with orthopedics per their recommendations  Uncontrolled type II DM/IDDM  Hemoglobin A1c: 8.7.  -  Continue 70/30 insulin 25 units BID -  Continue CBG ACHS and titrate insulin dose as needed for hypoglycemia or CBG > 250  AKI, likely secondary to dehydration and possibly vancomycin.  Creatinine trending down with IVF. -  Continue to hold ACEI until creatinine at baseline for at least 1 week -  Repeat Creatinine 2/13 and then twice weekly thereafter  CAD stable.  Continue aspirin, beta blockers and statins.   Paroxysmal A. Fib, rate controlled -  Continue beta blockers and anticoagulation with Coumadin  Hypertension, currently hypotensive -  Hold HCTZ and lisinopril -  Decrease metoprolol and place hold parameter  GERD stable, Continue PPI   Acute blood loss  anemia, likely post-operative, however occult stool was positive -  Continue iron -  Recommend f/u with gastroenterology for colonoscopy within 1 month  Diet:  Diabetic Access:  PIV IVF:  Yes  Proph:  Coumadin  Code Status: Full Family Communication: Patient alone Disposition Plan: Pending improvement in nausea, blood pressure, kidney function   Consultants:  Orthopedics, Dr. Charlann Boxer  Procedures:  Left total knee arthroplasty resection with placement of antibiotic spacer 2/9  Antibiotics:  Vancomycin from 2/9   Cefazolin on 2/9 followed by Ceftriaxone on 2/10  HPI/Subjective:  Patient states that she is no longer constipated and feels better  Objective: Filed Vitals:   11/07/13 1401 11/07/13 1500 11/07/13 2246 11/08/13 0620  BP: 91/54 91/58 136/74 104/63  Pulse: 68 66 94 98  Temp:  97.3 F (36.3 C) 99.8 F (37.7 C) 98.9 F (37.2 C)  TempSrc:  Oral Oral Oral  Resp:   16 16  Height:      Weight:      SpO2:   94% 96%    Intake/Output Summary (Last 24 hours) at 11/08/13 1148 Last data filed at 11/08/13 0900  Gross per 24 hour  Intake   1355 ml  Output    900 ml  Net    455 ml   Filed Weights   11/05/13 2002  Weight: 123.3 kg (271 lb 13.2 oz)    Exam:   General:  BF, No acute distress  HEENT:  NCAT, MMM  Cardiovascular:  RRR, nl S1, S2  no mrg, 2+ pulses, warm extremities  Respiratory:  CTAB, no increased WOB  Abdomen:   NABS, soft, NT/ND  MSK:   Normal tone and bulk, left knee dressing clean, dry, and intact  Neuro:  Grossly intact  Data Reviewed: Basic Metabolic Panel:  Recent Labs Lab 11/06/13 0410 11/07/13 0505 11/08/13 0507  NA 135* 134* 133*  K 4.5 3.7 3.5*  CL 97 97 98  CO2 26 25 26   GLUCOSE 261* 197* 140*  BUN 15 20 21   CREATININE 0.80 1.36* 1.25*  CALCIUM 8.8 8.6 8.4   Liver Function Tests: No results found for this basename: AST, ALT, ALKPHOS, BILITOT, PROT, ALBUMIN,  in the last 168 hours No results found for this  basename: LIPASE, AMYLASE,  in the last 168 hours No results found for this basename: AMMONIA,  in the last 168 hours CBC:  Recent Labs Lab 11/06/13 0410 11/07/13 0505 11/08/13 0507  WBC 15.7* 8.0 7.9  HGB 11.7* 8.9* 8.9*  HCT 33.9* 26.4* 26.1*  MCV 84.5 84.9 84.7  PLT 203 154 170   Cardiac Enzymes: No results found for this basename: CKTOTAL, CKMB, CKMBINDEX, TROPONINI,  in the last 168 hours BNP (last 3 results) No results found for this basename: PROBNP,  in the last 8760 hours CBG:  Recent Labs Lab 11/07/13 1735 11/07/13 2122 11/07/13 2359 11/08/13 0406 11/08/13 0716  GLUCAP 168* 147* 121* 112* 121*    Recent Results (from the past 240 hour(s))  SURGICAL PCR SCREEN     Status: None   Collection Time    10/30/13  2:52 PM      Result Value Ref Range Status   MRSA, PCR NEGATIVE  NEGATIVE Final   Staphylococcus aureus NEGATIVE  NEGATIVE Final   Comment:            The Xpert SA Assay (FDA     approved for NASAL specimens     in patients over 65 years of age),     is one component of     a comprehensive surveillance     program.  Test performance has     been validated by [employer] for patients greater     than or equal to 18 year old.     It is not intended     to diagnose infection nor to     guide or monitor treatment.  GRAM STAIN     Status: None   Collection Time    11/05/13  4:15 PM      Result Value Ref Range Status   Specimen Description SYNOVIAL LEFT KNEE   Final   Special Requests NONE   Final   Gram Stain     Final   Value: NO ORGANISMS SEEN     NO WBC SEEN     NO SQUAMOUS EPITHELIAL CELLS SEEN     Gram Stain Report Called to,Read Back By and Verified With: ANDERSON,C AT 1816 ON [id number] BY POTEAT,S   Report Status 11/05/2013 FINAL   Final  ANAEROBIC CULTURE     Status: None   Collection Time    11/05/13  4:15 PM      Result Value Ref Range Status   Specimen Description SYNOVIAL LEFT KNEE   Final   Special Requests NONE   Final   Gram  Stain     Final   Value: RARE WBC PRESENT, PREDOMINANTLY PMN     NO ORGANISMS SEEN     Performed  at [facility]     Final   Value: NO ANAEROBES ISOLATED; CULTURE IN PROGRESS FOR 5 DAYS     Performed at [facility]   Report Status PENDING   Incomplete  BODY FLUID CULTURE     Status: None   Collection Time    11/05/13  4:15 PM      Result Value Ref Range Status   Specimen Description SYNOVIAL LEFT KNEE   Final   Special Requests NONE   Final   Gram Stain     Final   Value: RARE WBC PRESENT, PREDOMINANTLY PMN     NO ORGANISMS SEEN     Performed at [facility]   Culture     Final   Value: NO GROWTH 1 DAY     Performed at [facility]   Report Status PENDING   Incomplete     Studies: No results found.  Scheduled Meds: . aspirin EC  81 mg Oral QPM  . cefTRIAXone (ROCEPHIN)  IV  2 g Intravenous Q24H  . cholecalciferol  1,000 Units Oral QHS  . docusate sodium  100 mg Oral BID  . ferrous sulfate  325 mg Oral TID PC  . HYDROcodone-acetaminophen  1-2 tablet Oral Q4H  . insulin aspart  0-5 Units Subcutaneous QHS  . insulin aspart  0-9 Units Subcutaneous TID WC  . insulin aspart protamine- aspart  25 Units Subcutaneous BID WC  . metoCLOPramide  5 mg Oral Q breakfast  . metoprolol  25 mg Oral BID  . pantoprazole  80 mg Oral Daily  . polyethylene glycol  17 g Oral BID  . senna  2 tablet Oral QHS  . simvastatin  40 mg Oral QPM  . vancomycin  1,250 mg Intravenous Q24H  . warfarin  5 mg Oral ONCE-1800  . Warfarin - Pharmacist Dosing Inpatient   Does not apply q1800   Continuous Infusions: . sodium chloride 75 mL/hr at 11/07/13 1818    Principal Problem:   Left knee spacer, following removal of joint prosthesis Active Problems:   Morbid obesity   Type II or unspecified type diabetes mellitus without mention of complication, uncontrolled   Expected blood loss anemia   Unspecified constipation    Time spent: 30 min    [name],  [facility]  [facility] Pager [phone]. If 7PM-7AM, please contact night-coverage at [email or web address], password [facility] 11/08/2013, 11:48 AM  LOS: 3 days

## 2013-11-08 NOTE — Progress Notes (Signed)
ANTIBIOTIC CONSULT NOTE - FOLLOW UP  Pharmacy Consult for Vancomycin Indication: presumed prosthetic joint infection  Allergies  Allergen Reactions  . Levofloxacin Itching  . Morphine And Related Itching    Recent Labs  11/06/13 0410 11/07/13 0505 11/08/13 0507  WBC 15.7* 8.0 7.9  HGB 11.7* 8.9* 8.9*  PLT 203 154 170  CREATININE 0.80 1.36* 1.25*   Estimated Creatinine Clearance: 58 ml/min (by C-G formula based on Cr of 1.25).  Recent Labs  11/07/13 0755 11/08/13 0507  VANCOTROUGH 31.1*  --   VANCORANDOM  --  16.4     Assessment: 28 yoF with infected left TKR with arthrofibrosis s/p resection and antibiotic spacer placement 2/9. Pharmacy consulted to dose vancomycin. Ancef and vancomycin given preop.   2/9 Ancef, Vanc x 1 pre-op 2/9 Tobra 6g + Vanc 5g (abx spacer) 2/9 >> Vancomycin >> 2/10 >> Ceftriaxone (ID) >>  Tmax: 99.8 WBC: WNL starting 2/11 Renal: Scr improved to 1.25;  CrCl 58 mL/min , normalized CrCl 52 mL/min/72 kg.  2/9 Synovial L Knee GS >> No organisms seen 2/9 Synovial L knee >> NGTD  2/9 Synovial L knee (anaerobic) >> NGTD  Vancomycin trough therapeutic (16.4) after temporarily holding drug.  Most recent dosage regimen was1250 mg IV q12h, last given on 2/10 at 2046.  Goal of Therapy:  Vancomycin trough level 15-20 mcg/ml  Plan:   Restart vancomycin at 1250 mg IV q24h, next dose today at 10am  Follow labs closely:  check serum creatinine at least twice a week and vancomycin trough at least once a week unless ordered otherwise by ID physician.     Suggest considering discontinuing celecoxib given potential effects on renal function.  Clayburn Pert, PharmD, BCPS Pager: [phone] 11/08/2013  6:39 AM

## 2013-11-08 NOTE — Progress Notes (Signed)
   Subjective: 2 Days Post-Op Procedure(s) (LRB): LEFT TOTAL KNEE RESECTION (Left)   Patient reports pain as mild, pain controlled. Not feeling well today.  She has been passing gas, but no BM as of yet. No events throughout the night.   Objective:   VITALS:   Filed Vitals:   11/08/13 0533  BP: 97/60  Pulse: 81  Temp: 98 F (36.7 C)  Resp: 15    Neurovascular intact Dorsiflexion/Plantar flexion intact Incision: dressing C/D/I No cellulitis present Compartment soft  LABS  Recent Labs  11/06/13 0410 11/07/13 0505  HGB 11.7* 8.9*  HCT 33.9* 26.4*  WBC 15.7* 8.0  PLT 203 154     Recent Labs  11/06/13 0410 11/07/13 0505  NA 135* 134*  K 4.5 3.7  BUN 15 20  CREATININE 0.80 1.36*  GLUCOSE 261* 197*     Assessment/Plan: 2 Days Post-Op Procedure(s) (LRB): LEFT TOTAL KNEE RESECTION (Left) Vanco trough ~30, vanco stopped today and pharmacy watching. Up with therapy Discharge to SNF eventually, when ready  Expected ABLA  Treated with iron and will observe   Morbid Obesity (BMI >40)  Estimated body mass index is 48.16 kg/(m^2) as calculated from the following:      Height as of this encounter: 5\' 3"  (1.6 m).      Weight as of this encounter: 123.3 kg (271 lb 13.2 oz).  Patient also counseled that weight may inhibit the healing process  Patient counseled that losing weight will help with future health issues       West Pugh. Donita Newland   PAC  11/08/2013, 8:42 AM

## 2013-11-08 NOTE — Progress Notes (Signed)
    [facility] for Infectious Disease    Date of Admission:  11/05/2013   Total days of antibiotics 4        Day 4 vanco        Day 3 ceftriaxone           ID: [name] is a 63 y.o. female with culture negative PJI s/p HW removal and antibiotic spacer placement Principal Problem:   Left knee spacer, following removal of joint prosthesis Active Problems:   Morbid obesity   Type II or unspecified type diabetes mellitus without mention of complication, uncontrolled   Expected blood loss anemia   Unspecified constipation    Subjective: Afebrile   24hr: cr improved from yesterday at 1.25 today. vanco random level at 16  Medications:  . aspirin EC  81 mg Oral QPM  . cefTRIAXone (ROCEPHIN)  IV  2 g Intravenous Q24H  . cholecalciferol  1,000 Units Oral QHS  . docusate sodium  100 mg Oral BID  . ferrous sulfate  325 mg Oral TID PC  . HYDROcodone-acetaminophen  1-2 tablet Oral Q4H  . insulin aspart  0-5 Units Subcutaneous QHS  . insulin aspart  0-9 Units Subcutaneous TID WC  . insulin aspart protamine- aspart  25 Units Subcutaneous BID WC  . metoCLOPramide  5 mg Oral Q breakfast  . metoprolol  25 mg Oral BID  . pantoprazole  80 mg Oral Daily  . polyethylene glycol  17 g Oral BID  . senna  2 tablet Oral QHS  . simvastatin  40 mg Oral QPM  . vancomycin  1,250 mg Intravenous Q24H  . warfarin  5 mg Oral ONCE-1800  . Warfarin - Pharmacist Dosing Inpatient   Does not apply q1800    Objective: Vital signs in last 24 hours: Temp:  [97.3 F (36.3 C)-99.8 F (37.7 C)] 98.9 F (37.2 C) (02/12 0620) Pulse Rate:  [57-98] 98 (02/12 0620) Resp:  [16] 16 (02/12 0620) BP: (91-136)/(54-74) 104/63 mmHg (02/12 0620) SpO2:  [94 %-96 %] 96 % (02/12 0620) Did not examine  Lab Results  Recent Labs  11/07/13 0505 11/08/13 0507  WBC 8.0 7.9  HGB 8.9* 8.9*  HCT 26.4* 26.1*  NA 134* 133*  K 3.7 3.5*  CL 97 98  CO2 25 26  BUN 20 21  CREATININE 1.36* 1.25*   Liver  Panel No results found for this basename: PROT, ALBUMIN, AST, ALT, ALKPHOS, BILITOT, BILIDIR, IBILI,  in the last 72 hours Sedimentation Rate  Recent Labs  11/07/13 0505  ESRSEDRATE 70*   C-Reactive Protein  Recent Labs  11/07/13 0505  CRP 5.2*    Microbiology: 2/9 or culture: NGTD Studies/Results: No results found.   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 vanco dosed at once a day to meet criteria of vanco trough 15-20.  aki = currently improving but not back to baseline.  vanco now dosed at once a day. Avoid nephrotoxic meds. Agree with pharm rec that for discharge, she will need twice a week BMP while on vanco. She will need weekly cbc and picc line dressing changes  supratherapeutic vanco level = resolved. Now in therapeutic range.  [name]  [facility] Cell: [phone] Pager: [phone]  11/08/2013, 8:37 AM

## 2013-11-08 NOTE — Progress Notes (Signed)
ANTICOAGULATION CONSULT NOTE - Follow Up Consult  Pharmacy Consult for warfarin Indication: atrial fibrillation  Allergies  Allergen Reactions  . Levofloxacin Itching  . Morphine And Related Itching   Labs:  Recent Labs  11/06/13 0410 11/07/13 0505 11/08/13 0507  HGB 11.7* 8.9* 8.9*  HCT 33.9* 26.4* 26.1*  PLT 203 154 170  LABPROT 14.2 19.8* 22.1*  INR 1.12 1.74* 2.00*  CREATININE 0.80 1.36* 1.25*    Assessment: 43 yoF admitted for infected TKA s/p resection and antibiotic spacer placement 2/9. Pt on chronic Coumadin for hx PAF and pharmacy consulted to dose inpatient with Lovenox bridge until INR >1.8. Note pt also on ASA 81mg  and PRN ibuprofen PTA. INR 1.04 day of OR. Pt was reportedly on warfarin 7.5mg  daily except 3.75mg  on Mon/Fri PTA. 2/12:   INR therapeutic (2.0) after inpatient warfarin doses of 10, 10, 2.5 mg on 2/9 - 2/11.  Hgb stable, pltc improved.  On regular diet  Goal of Therapy:  INR 2-3    Plan:   Warfarin 5 mg PO today.  Continue daily PT/INR while inpatient.  Clayburn Pert, PharmD, BCPS Pager: [phone] 11/08/2013  6:51 AM

## 2013-11-08 NOTE — Progress Notes (Signed)
   Subjective: 3 Days Post-Op Procedure(s) (LRB): LEFT TOTAL KNEE RESECTION (Left)   Patient reports pain as mild, pain controlled.  Feeling much better this morning, nausea resolved after finally having a BM yesterday. No event throughout the night.  Ready to be discharged to SNF when ready.  Objective:   VITALS:   Filed Vitals:   11/08/13 0620  BP: 104/63  Pulse: 98  Temp: 98.9 F (37.2 C)  Resp: 16    Neurovascular intact Dorsiflexion/Plantar flexion intact Incision: dressing C/D/I No cellulitis present Compartment soft  LABS  Recent Labs  11/06/13 0410 11/07/13 0505 11/08/13 0507  HGB 11.7* 8.9* 8.9*  HCT 33.9* 26.4* 26.1*  WBC 15.7* 8.0 7.9  PLT 203 154 170     Recent Labs  11/06/13 0410 11/07/13 0505 11/08/13 0507  NA 135* 134* 133*  K 4.5 3.7 3.5*  BUN 15 20 21   CREATININE 0.80 1.36* 1.25*  GLUCOSE 261* 197* 140*     Assessment/Plan: 3 Days Post-Op Procedure(s) (LRB): LEFT TOTAL KNEE RESECTION (Left) Vanco restarted, now with a trough level of now 16. Up with therapy Discharge to SNF Follow up in 2 weeks at [facility]. Follow up with OLIN,Sarah-Jane Nazario D in 2 weeks.  Contact information:  [facility] [address] [place] [phone]    Expected ABLA  Treated with iron and will observe   Morbid Obesity (BMI >40)  Estimated body mass index is 48.16 kg/(m^2) as calculated from the following:      Height as of this encounter: 5\' 3"  (1.6 m).      Weight as of this encounter: 123.3 kg (271 lb 13.2 oz).  Patient also counseled that weight may inhibit the healing process  Patient counseled that losing weight will help with future health issues        West Pugh. Heavenleigh Petruzzi   PAC  11/08/2013, 8:33 AM

## 2013-11-08 NOTE — Progress Notes (Signed)
Physical Therapy Treatment Patient Details Name: [name] MRN: [id number] DOB: 11-24-1950 Today's Date: 11/08/2013 Time: 7672-0947 PT Time Calculation (min): 18 min  PT Assessment / Plan / Recommendation  History of Present Illness 63 yo female s/p L knee resection and spacer 11/05/13. Hx of L TKA 2004, 2007 L knee revision with scar tissue removal, L ankle fx, DJD, osteopenia, obesity.    PT Comments   Progressing with mobility. Continued to educate pt on PWB status. Recommend ST rehab at [facility]   Follow Up Recommendations  SNF     Does the patient have the potential to tolerate intense rehabilitation     Barriers to Discharge        Equipment Recommendations  None recommended by PT    Recommendations for Other Services    Frequency Min 4X/week   Progress towards PT Goals Progress towards PT goals: Progressing toward goals  Plan Current plan remains appropriate    Precautions / Restrictions Precautions Precautions: Knee;Fall Required Braces or Orthoses: Knee Immobilizer - Left Knee Immobilizer - Left: On when out of bed or walking Restrictions Weight Bearing Restrictions: Yes LLE Weight Bearing: Partial weight bearing LLE Partial Weight Bearing Percentage or Pounds: 25%   Pertinent Vitals/Pain 3/10 L LE    Mobility  Bed Mobility General bed mobility comments: pt sitting in recliner Transfers Overall transfer level: Needs assistance Transfers: Sit to/from Stand Sit to Stand: Mod assist General transfer comment: Assist to rise, stabilize, control descent. VCs safety, technique, hand placement. Ambulation/Gait Ambulation/Gait assistance: Mod assist Ambulation Distance (Feet): 25 Feet Assistive device: Rolling walker (2 wheeled) General Gait Details: VCs safety, technique, sequence, adherence to WB status. slow gait speed. Limited distance to aid pt in maintaining WBing. Followed with recliner.     Exercises     PT Diagnosis:    PT Problem List:   PT  Treatment Interventions:     PT Goals (current goals can now be found in the care plan section)    Visit Information  Last PT Received On: 11/08/13 Assistance Needed: +2 History of Present Illness: 63 yo female s/p L knee resection and spacer 11/05/13. Hx of L TKA 2004, 2007 L knee revision with scar tissue removal, L ankle fx, DJD, osteopenia, obesity.     Subjective Data      Cognition  Cognition Arousal/Alertness: Awake/alert Behavior During Therapy: WFL for tasks assessed/performed Overall Cognitive Status: Within Functional Limits for tasks assessed    Balance     End of Session PT - End of Session Activity Tolerance: Patient tolerated treatment well Patient left: in chair;with call bell/phone within reach   GP     Weston Anna, MPT Pager: [phone]

## 2013-11-08 NOTE — Discharge Summary (Signed)
Physician Discharge Summary  Patient ID: [name] MRN: [id number] DOB/AGE: [birth year] 63 y.o.  Admit date: 11/05/2013 Discharge date:  11/08/2013  Procedures:  Procedure(s) (LRB): LEFT TOTAL KNEE RESECTION (Left)  Attending Physician:  Dr. Durene Romans   Admission Diagnoses:   Left knee infection / pain  Discharge Diagnoses:  Principal Problem:   Left knee spacer, following removal of joint prosthesis Active Problems:   Morbid obesity   Type II or unspecified type diabetes mellitus without mention of complication, uncontrolled   Expected blood loss anemia   Unspecified constipation  Past Medical History  Diagnosis Date  . Hypertension   . Hypercholesteremia   . Cancer 5/10    LUL Vats   . Lung cancer   . Peripheral vascular disease 8/12    Lt SFA PTA  . Sleep apnea     " mild form ,does not use cpap "  . Diabetes mellitus     insulin dependent  . GERD (gastroesophageal reflux disease)   . H/O hiatal hernia   . Arthritis   . Non-STEMI (non-ST elevated myocardial infarction) 04/07/2012    See cath results below  . Presence of stent in LAD coronary artery 04/07/2012    Xience Expedition DES 2.75 mm x 18 mm (dilated to 3.0 mm)  . Paroxysmal atrial fibrillation 04/07/2012    On Warfarin  . Heart palpitations     monitor 04/19/12-05/10/12- frequent PAF with RVR- coumadin started    HPI: [name], 63 y.o. female, has a history of pain and functional disability in the left knee. The patient reports left knee symptoms including: pain, weakness and stiffness which began 7 year(s) ago following a specific injury (history of left TKA in 2004, 2005 left knee scope to remove scar tissue, 2006 left knee scope for scar tissue, 2007 left TKA revision with scar tissue removal. 2013 bimalleolar ORIF of left ankle - she states the ankle fracture made her left knee pain worse, prior to ORIF she did not have to use a walker/cane). The patient describes the severity of the  symptoms as severe (especially getting in and out of chairs).The patient feels that the symptoms are worsening. Symptoms are reported to be located in the left knee and include knee pain, swelling, decreased range of motion, instability, difficulty bearing weight and difficulty ambulating. The patient describes their pain as sharp. Onset of symptoms was gradual with symptoms now occurring constantly. Symptoms are exacerbated by motion at the knee, weight bearing, walking and squatting. Symptoms are relieved by rest and non-opioid analgesics (Advil, BC powder - she tries not to because she is on warfarin). Current treatment includes use of a walker (and cane). Risks, benefits and expectations were discussed with the patient. Risks including but not limited to the risk of anesthesia, blood clots, nerve damage, blood vessel damage, failure of the prosthesis, infection and up to and including death. Patient understand the risks, benefits and expectations and wishes to proceed with surgery.   PCP: Georgianne Fick, MD   Discharged Condition: good  Hospital Course:  Patient underwent the above stated procedure on 11/05/2013. Patient tolerated the procedure well and brought to the recovery room in good condition and subsequently to the floor.  POD #1 BP: 146/62 ; Pulse: 77 ; Temp: 97.5 F (36.4 C) ; Resp: 16  Patient reports pain as mild, pain controlled. No events throughout the night.  Neurovascular intact, dorsiflexion/plantar flexion intact, incision: dressing C/D/I, no cellulitis present and compartment soft.   LABS  Basename    HGB  11.7  HCT  33.9   POD #2  BP: 97/60 ; Pulse: 81 ; Temp: 98 F (36.7 C) ; Resp: 15 Patient reports pain as mild, pain controlled. Not feeling well today. She has been passing gas, but no BM as of yet. No events throughout the night.  Neurovascular intact, dorsiflexion/plantar flexion intact, incision: dressing C/D/I, no cellulitis present and compartment soft.   LABS   Basename    HGB  8.9  HCT  26.4   POD #3  BP: 104/63 ; Pulse: 98 ; Temp: 98.9 F (37.2 C) ; Resp: 16  Patient reports pain as mild, pain controlled. Feeling much better this morning, nausea resolved after finally having a BM yesterday. No event throughout the night. Ready to be discharged to SNF. Neurovascular intact, dorsiflexion/plantar flexion intact, incision: dressing C/D/I, no cellulitis present and compartment soft.   LABS   No new labs  Discharge Exam: General appearance: alert, cooperative and no distress Extremities: Homans sign is negative, no sign of DVT, no edema, redness or tenderness in the calves or thighs and no ulcers, gangrene or trophic changes  Disposition: Skilled nursing facility with follow up in 2 weeks   Follow-up Information   Follow up with Shelda Pal, MD. Schedule an appointment as soon as possible for a visit in 2 weeks.   Specialty:  Orthopedic Surgery   Contact information:   [address] [place] [address] [phone]       Discharge Orders   Future Appointments Provider Department Dept Phone   11/29/2013 12:20 PM Phillips Hay, RPH-CPP [facility] [phone]   09/10/2014 9:45 AM [facility] Medical Oncology [phone]   09/10/2014 10:30 AM [facility] 2 [facility] [phone]   Liquids only 4 hours prior to your exam. Any medications can be taken as usual. Please arrive 15 min prior to your scheduled exam time.   09/17/2014 11:00 AM Si Gaul, MD [facility] Medical Oncology [phone]   Future Orders Complete By Expires   Call MD / Call 911  As directed    Comments:     If you experience chest pain or shortness of breath, CALL 911 and be transported to the hospital emergency room.  If you develope a fever above 101 F, pus (white drainage) or increased drainage or redness at the wound, or calf pain, call your surgeon's  office.   Constipation Prevention  As directed    Comments:     Drink plenty of fluids.  Prune juice may be helpful.  You may use a stool softener, such as Colace (over the counter) 100 mg twice a day.  Use MiraLax (over the counter) for constipation as needed.   Diet - low sodium heart healthy  As directed    Discharge instructions  As directed    Comments:     Daily dressing changes with 4x4 guaze and tape.  Keep the area clean and dry.  Follow up in 2 weeks at [facility]. Call with any questions or concerns. Check BMP at least twice a week and vancomycin trough at least once a week.  Pharmacy to monitor and adjust Vancomycin with  trough to be maintained between 15-20.  Results to be shared with Dr. Judyann Munson at [facility] for Infectious Diseases.   Touch down weight bearing  As directed    Questions:     Laterality:  left  Extremity:  Lower        Medication List    STOP taking these medications       ibuprofen 200 MG tablet  Commonly known as:  ADVIL,MOTRIN     insulin regular 100 units/mL injection  Commonly known as:  NOVOLIN R,HUMULIN R     lisinopril-hydrochlorothiazide 20-25 MG per tablet  Commonly known as:  PRINZIDE,ZESTORETIC      TAKE these medications       albuterol 108 (90 BASE) MCG/ACT inhaler  Commonly known as:  PROVENTIL HFA;VENTOLIN HFA  Inhale 1-2 puffs into the lungs every 6 (six) hours as needed for wheezing.     aspirin EC 81 MG tablet  Take 81 mg by mouth every evening.     cholecalciferol 1000 UNITS tablet  Commonly known as:  VITAMIN D  Take 1,000 Units by mouth at bedtime.     dextrose 5 % SOLN 50 mL with cefTRIAXone 2 G SOLR 2 g  Inject 2 g into the vein daily.     DSS 100 MG Caps  Take 100 mg by mouth 2 (two) times daily.     ferrous sulfate 325 (65 FE) MG tablet  Take 1 tablet (325 mg total) by mouth 3 (three) times daily after meals.     HYDROcodone-acetaminophen 7.5-325 MG per tablet  Commonly known as:   NORCO  Take 1-2 tablets by mouth every 4 (four) hours as needed for moderate pain.     insulin aspart protamine- aspart (70-30) 100 UNIT/ML injection  Commonly known as:  NOVOLOG MIX 70/30  Inject 0.25 mLs (25 Units total) into the skin 2 (two) times daily with a meal.     metoCLOPramide 10 MG tablet  Commonly known as:  REGLAN  Take 5-10 mg by mouth 2 (two) times daily. 5mg  in AM, 10mg  in PM Nausea     metoprolol 50 MG tablet  Commonly known as:  LOPRESSOR  Take 1 tablet (50 mg total) by mouth 2 (two) times daily.     omeprazole 40 MG capsule  Commonly known as:  PRILOSEC  Take 40 mg by mouth daily.     polyethylene glycol packet  Commonly known as:  MIRALAX / GLYCOLAX  Take 17 g by mouth 2 (two) times daily.     simvastatin 40 MG tablet  Commonly known as:  ZOCOR  Take 40 mg by mouth every evening.     sodium chloride 0.9 % SOLN 250 mL with vancomycin 10 G SOLR 1,250 mg  Inject 1,250 mg into the vein daily.     tiZANidine 4 MG tablet  Commonly known as:  ZANAFLEX  Take 1 tablet (4 mg total) by mouth every 8 (eight) hours as needed.     warfarin 5 MG tablet  Commonly known as:  COUMADIN  Take 1 tablet (5 mg total) by mouth one time only at 6 PM.         Signed: Anastasio Auerbach. Carlton Sweaney   PAC  11/08/2013, 9:10 AM

## 2013-11-09 LAB — BODY FLUID CULTURE: Culture: NO GROWTH

## 2013-11-10 LAB — ANAEROBIC CULTURE

## 2013-11-13 ENCOUNTER — Non-Acute Institutional Stay (SKILLED_NURSING_FACILITY): Payer: Medicare Other | Admitting: Internal Medicine

## 2013-11-13 DIAGNOSIS — S80259A Superficial foreign body, unspecified knee, initial encounter: Principal | ICD-10-CM

## 2013-11-13 DIAGNOSIS — S80859A Superficial foreign body, unspecified lower leg, initial encounter: Secondary | ICD-10-CM

## 2013-11-13 DIAGNOSIS — K5900 Constipation, unspecified: Secondary | ICD-10-CM | POA: Diagnosis not present

## 2013-11-13 DIAGNOSIS — L089 Local infection of the skin and subcutaneous tissue, unspecified: Secondary | ICD-10-CM

## 2013-11-13 DIAGNOSIS — D500 Iron deficiency anemia secondary to blood loss (chronic): Secondary | ICD-10-CM | POA: Diagnosis not present

## 2013-11-13 DIAGNOSIS — S90559A Superficial foreign body, unspecified ankle, initial encounter: Secondary | ICD-10-CM

## 2013-11-13 DIAGNOSIS — E119 Type 2 diabetes mellitus without complications: Secondary | ICD-10-CM

## 2013-11-13 DIAGNOSIS — S70359A Superficial foreign body, unspecified thigh, initial encounter: Secondary | ICD-10-CM

## 2013-11-13 DIAGNOSIS — S70259A Superficial foreign body, unspecified hip, initial encounter: Secondary | ICD-10-CM

## 2013-11-14 ENCOUNTER — Other Ambulatory Visit: Payer: Self-pay | Admitting: *Deleted

## 2013-11-14 ENCOUNTER — Non-Acute Institutional Stay (SKILLED_NURSING_FACILITY): Payer: Medicare Other | Admitting: Family

## 2013-11-14 DIAGNOSIS — L089 Local infection of the skin and subcutaneous tissue, unspecified: Secondary | ICD-10-CM

## 2013-11-14 DIAGNOSIS — E119 Type 2 diabetes mellitus without complications: Secondary | ICD-10-CM | POA: Diagnosis not present

## 2013-11-14 DIAGNOSIS — S80859A Superficial foreign body, unspecified lower leg, initial encounter: Secondary | ICD-10-CM

## 2013-11-14 DIAGNOSIS — S70359A Superficial foreign body, unspecified thigh, initial encounter: Secondary | ICD-10-CM

## 2013-11-14 DIAGNOSIS — S70259A Superficial foreign body, unspecified hip, initial encounter: Secondary | ICD-10-CM

## 2013-11-14 DIAGNOSIS — S80259A Superficial foreign body, unspecified knee, initial encounter: Principal | ICD-10-CM

## 2013-11-14 DIAGNOSIS — S90559A Superficial foreign body, unspecified ankle, initial encounter: Secondary | ICD-10-CM

## 2013-11-14 MED ORDER — TRAMADOL HCL 50 MG PO TABS: ORAL_TABLET | ORAL | Status: DC

## 2013-11-14 NOTE — Telephone Encounter (Signed)
[facility] 

## 2013-11-18 DIAGNOSIS — L089 Local infection of the skin and subcutaneous tissue, unspecified: Secondary | ICD-10-CM | POA: Insufficient documentation

## 2013-11-18 DIAGNOSIS — S80259A Superficial foreign body, unspecified knee, initial encounter: Principal | ICD-10-CM

## 2013-11-18 NOTE — Progress Notes (Signed)
HISTORY & PHYSICAL  DATE: 11/13/2013   FACILITY: [facility]  LEVEL OF CARE: SNF (31)  ALLERGIES:  Allergies  Allergen Reactions  . Levofloxacin Itching  . Morphine And Related Itching    CHIEF COMPLAINT:  Manage left knee infection, acute blood loss anemia and diabetes mellitus  HISTORY OF PRESENT ILLNESS: Patient is a 63 year old African American female.  LEFT KNEE INFECTION: The patient was having severe left knee pain, weakness and stiffness along with functional disability. She was diagnosed with a left knee infection and therefore underwent left total knee resection and placement of a left knee spacer. She tolerated the procedure well. Patient currently denies knee pain. She is admitted to this facility for short-term rehabilitation.  ANEMIA: The anemia has been stable. The patient denies fatigue, melena or hematochezia. No complications from the medications currently being used. Last hemoglobins are 11.7, 8.9.  DM:pt's DM remains stable.  Pt denies polyuria, polydipsia, polyphagia, changes in vision or hypoglycemic episodes.  No complications noted from the medication presently being used.  Last hemoglobin A1c is: not Available.  PAST MEDICAL HISTORY :  Past Medical History  Diagnosis Date  . Hypertension   . Hypercholesteremia   . Cancer 5/10    LUL Vats   . Lung cancer   . Peripheral vascular disease 8/12    Lt SFA PTA  . Sleep apnea     " mild form ,does not use cpap "  . Diabetes mellitus     insulin dependent  . GERD (gastroesophageal reflux disease)   . H/O hiatal hernia   . Arthritis   . Non-STEMI (non-ST elevated myocardial infarction) 04/07/2012    See cath results below  . Presence of stent in LAD coronary artery 04/07/2012    Xience Expedition DES 2.75 mm x 18 mm (dilated to 3.0 mm)  . Paroxysmal atrial fibrillation 04/07/2012    On Warfarin  . Heart palpitations     monitor 04/19/12-05/10/12- frequent PAF with RVR- coumadin  started    PAST SURGICAL HISTORY: Past Surgical History  Procedure Laterality Date  . Abdominal hysterectomy  1960  . Cholecystectomy    . Left knee replacement    . Lung surgery  5/10  . Percutaneous coronary angioplasty and stenting  04/07/2012    Xience Expedition DES 2.60mm x 18 mm (dilated to 3.0 mm) place to the prox LAD   . Orif ankle fracture  07/09/2012    Procedure: OPEN REDUCTION INTERNAL FIXATION (ORIF) ANKLE FRACTURE;  Surgeon: Meredith Pel, MD;  Location: [facility];  Service: Orthopedics;  Laterality: Left;  open reduction internal fixation trimalleolar ankle fracture medial malleolous fixation  . Cardiac catheterization  11/04/2008    patent RCA, LM, and Circ, nl EF  . Cardiac catheterization  02/20/2002    patent coronaries with the only abnormality being a smooth luminla irregularity in the mid intermediate ramus branch no felt to be hemodynamically significant, nl LV  . Lower extremity angiogram  05/17/11    directional atherectomy to the prox L SFA using a LX Man TurboHawk, ballooned with a [employer] balloon   . Coronary angioplasty with stent placement  04/07/12    LAD  . Total knee revision Left 11/05/2013    Procedure: LEFT TOTAL KNEE RESECTION;  Surgeon: Mauri Pole, MD;  Location: [facility];  Service: Orthopedics;  Laterality: Left;    SOCIAL HISTORY:  reports that she quit smoking about 29 years ago.  Her smoking use included Cigarettes. She has a 20 pack-year smoking history. She has never used smokeless tobacco. She reports that she does not drink alcohol or use illicit drugs.  FAMILY HISTORY:  Family History  Problem Relation Age of Onset  . Hypertension Mother   . Coronary artery disease Brother   . Hypertension Sister     CURRENT MEDICATIONS: Reviewed per [facility]  REVIEW OF SYSTEMS:  See HPI otherwise 14 point ROS is negative.  PHYSICAL EXAMINATION  VS:  See VS section  GENERAL: no acute distress, morbidly obese body habitus EYES: conjunctivae normal,  sclerae normal, normal eye lids MOUTH/THROAT: lips without lesions,no lesions in the mouth,tongue is without lesions,uvula elevates in midline NECK: supple, trachea midline, no neck masses, no thyroid tenderness, no thyromegaly LYMPHATICS: no LAN in the neck, no supraclavicular LAN RESPIRATORY: breathing is even & unlabored, BS CTAB CARDIAC: RRR, no murmur,no extra heart sounds, left lower extremity +4 edema and right lower extremity +2 edema GI:  ABDOMEN: abdomen soft, normal BS, no masses, no tenderness  LIVER/SPLEEN: no hepatomegaly, no splenomegaly MUSCULOSKELETAL: HEAD: normal to inspection & palpation BACK: no kyphosis, scoliosis or spinal processes tenderness EXTREMITIES: LEFT UPPER EXTREMITY: full range of motion, normal strength & tone RIGHT UPPER EXTREMITY:  full range of motion, normal strength & tone LEFT LOWER EXTREMITY:   range of motion not tested due to surgery, normal strength & tone RIGHT LOWER EXTREMITY:  Minimal range of motion, normal strength & tone PSYCHIATRIC: the patient is alert & oriented to person, affect & behavior appropriate  LABS/RADIOLOGY:  Labs reviewed: Basic Metabolic Panel:  Recent Labs  11/06/13 0410 11/07/13 0505 11/08/13 0507  NA 135* 134* 133*  K 4.5 3.7 3.5*  CL 97 97 98  CO2 26 25 26   GLUCOSE 261* 197* 140*  BUN 15 20 21   CREATININE 0.80 1.36* 1.25*  CALCIUM 8.8 8.6 8.4   Liver Function Tests:  Recent Labs  09/12/13 1005  AST 20  ALT 15  ALKPHOS 98  BILITOT 0.63  PROT 7.7  ALBUMIN 3.2*   CBC:  Recent Labs  11/22/12 0515  09/12/13 1005  11/06/13 0410 11/07/13 0505 11/08/13 0507  WBC 7.3  < > 7.7  < > 15.7* 8.0 7.9  NEUTROABS 4.8  --  5.5  --   --   --   --   HGB 12.0  < > 13.4  < > 11.7* 8.9* 8.9*  HCT 34.6*  < > 39.2  < > 33.9* 26.4* 26.1*  MCV 81.6  < > 85.2  < > 84.5 84.9 84.7  PLT 234  < > 208  < > 203 154 170  < > = values in this interval not displayed.  Cardiac Enzymes:  Recent Labs  11/22/12 1553  11/22/12 2132  TROPONINI <0.30 <0.30   CBG:  Recent Labs  11/08/13 0406 11/08/13 0716 11/08/13 1146  GLUCAP 112* 121* 121*   ASSESSMENT/PLAN:  Left knee infection-status post left total knee resection. Continue rehabilitation. Acute blood loss anemia-continue iron. Recheck hemoglobin level Diabetes mellitus-continue current medications Constipation-well-controlled Hypertension-blood pressure borderline.  Check CBC and BMP  I have reviewed patient's medical records received at admission/from hospitalization.  CPT CODE: 42595  Gayani Y Dasanayaka, [place] [phone]

## 2013-11-21 ENCOUNTER — Encounter: Payer: Self-pay | Admitting: Infectious Diseases

## 2013-11-21 ENCOUNTER — Ambulatory Visit (INDEPENDENT_AMBULATORY_CARE_PROVIDER_SITE_OTHER): Payer: Medicare Other | Admitting: Infectious Diseases

## 2013-11-21 VITALS — BP 146/73 | HR 62 | Temp 97.9°F

## 2013-11-21 DIAGNOSIS — M869 Osteomyelitis, unspecified: Secondary | ICD-10-CM | POA: Diagnosis not present

## 2013-11-21 DIAGNOSIS — E119 Type 2 diabetes mellitus without complications: Secondary | ICD-10-CM

## 2013-11-21 DIAGNOSIS — I2510 Atherosclerotic heart disease of native coronary artery without angina pectoris: Secondary | ICD-10-CM

## 2013-11-21 NOTE — Assessment & Plan Note (Signed)
Appears to be well controlled per pt. PCP/SNF to f/u. Will be key to healing.

## 2013-11-21 NOTE — Progress Notes (Signed)
   Subjective:    Patient ID: [name], female    DOB: 18-Dec-1950, 63 y.o.   MRN: [id number]  HPI 63 y.o. female with history of oesity, DM2,a-fib (on coumadin) and OA of left knee s/p left TKA in 2004, 2005 and 2006 left knee arthroscopy to remove scar tissue, and then 2007 left TKA revision with scar tissue removal.  Over the past year, she has had severe pain to her left knee concerning for loosening of hardware. Due to inflammatory markers being elevated, there was concern for subacute infection. She was admitted to [facility] on 11-05-13 and underwent removal L TKR and antibiotic spacer placement.. She was started on vancomycin and ceftriaxone. Her Cx were (-). Her hospital course was complicated by ARF .She was d/c to SNF on 11-08-13 . Has 5/10 pain in her leg. Has been getting PT. No fever or chills. No problems with PIC, no erythema, no d/c.  FSG have been 105 in AM, 79-90 in the pm.   FHx/Sochx reviewed  Review of Systems  Constitutional: Negative for fever, chills, appetite change and unexpected weight change.  Eyes: Negative for visual disturbance.  Respiratory: Negative for cough and shortness of breath.   Cardiovascular: Positive for leg swelling.  Gastrointestinal: Negative for diarrhea and constipation.  Genitourinary: Negative for difficulty urinating.  Musculoskeletal: Positive for arthralgias.  Neurological: Positive for numbness.  Has ophtho appt in May 2015.      Objective:   Physical Exam  Constitutional: She appears well-developed and well-nourished.  HENT:  Mouth/Throat: No oropharyngeal exudate.  Eyes: EOM are normal. Pupils are equal, round, and reactive to light.  Neck: Neck supple.  Cardiovascular: Normal rate, regular rhythm and normal heart sounds.   Pulmonary/Chest: Effort normal and breath sounds normal.  Abdominal: Soft. Bowel sounds are normal. There is no tenderness.  Musculoskeletal: She exhibits edema.       Arms:      Legs: Lymphadenopathy:   She has no cervical adenopathy.          Assessment & Plan:

## 2013-11-21 NOTE — Assessment & Plan Note (Addendum)
Given her issues with ARF, we need to continue to follow her labs from home health. She appears to be doing well. Will check her ESR and CRP in f/u. She is at day 15 of therapy. Will see her back in 27 days.  Vanco 18.1 (11-18-13) Cr 1.04 (11-19-13)

## 2013-11-22 ENCOUNTER — Other Ambulatory Visit: Payer: Self-pay | Admitting: *Deleted

## 2013-11-22 MED ORDER — OXYCODONE-ACETAMINOPHEN 5-325 MG PO TABS: ORAL_TABLET | ORAL | Status: DC

## 2013-11-22 NOTE — Telephone Encounter (Signed)
[facility] 

## 2013-11-27 ENCOUNTER — Other Ambulatory Visit: Payer: Self-pay | Admitting: *Deleted

## 2013-11-27 MED ORDER — OXYCODONE-ACETAMINOPHEN 5-325 MG PO TABS: ORAL_TABLET | ORAL | Status: DC

## 2013-11-27 NOTE — Telephone Encounter (Signed)
[facility] 

## 2013-11-29 ENCOUNTER — Ambulatory Visit: Payer: Medicare Other | Admitting: Pharmacist Clinician (PhC)/ Clinical Pharmacy Specialist

## 2013-11-30 ENCOUNTER — Non-Acute Institutional Stay (SKILLED_NURSING_FACILITY): Payer: Medicare Other | Admitting: Internal Medicine

## 2013-11-30 DIAGNOSIS — IMO0001 Reserved for inherently not codable concepts without codable children: Secondary | ICD-10-CM

## 2013-11-30 DIAGNOSIS — M009 Pyogenic arthritis, unspecified: Secondary | ICD-10-CM

## 2013-11-30 DIAGNOSIS — I4891 Unspecified atrial fibrillation: Secondary | ICD-10-CM | POA: Diagnosis not present

## 2013-11-30 DIAGNOSIS — E1165 Type 2 diabetes mellitus with hyperglycemia: Principal | ICD-10-CM

## 2013-12-01 NOTE — Progress Notes (Addendum)
Patient ID: [name], female   DOB: 05-06-1951, 63 y.o.   MRN: [id number]                  PROGRESS NOTE  DATE:  11/30/2013    FAC[facility]: [facility]     LEVEL OF CARE:   SNF   Acute Visit   CHIEF COMPLAINT:  Follow up diabetes, AFib, on Coumadin, and infected left total knee prosthesis.    HISTORY OF PRESENT ILLNESS:  This is a patient who had a left total knee replacement in 2004 and then a revision in 2007.  She was developing increasing pain in her knee and due to inflammatory markers being elevated, there was a concern for infection.  She underwent removal of the left total knee replacement and then an antibiotic spacer, and she was started on vanc and Rocephin.  She is in SNF completing a six-week course.  Her cultures were negative.    She has been back to see Dr. Johnnye Sima.  Her sed rate on 11/07/2013 was 70.  Her C-reactive protein was 5.2 on 11/07/2013 and 2.5 on 11/21/2013.    REVIEW OF SYSTEMS:   GENERAL:  The patient feels well.  She is not in pain.  She is not having any fevers.   ENDOCRINE:  CBGs are reviewed.   Yesterday's were 125, 240, 330.  This is a fairly consistent pattern.  She becomes much higher later in the day to over 300 a.c. supper and at h.s.  She currently receives insulin 70/30, 25 U two times a day.    PHYSICAL EXAMINATION:   GENERAL APPEARANCE:  The patient looks well.  She is not running a fever.   MUSCULOSKELETAL:   EXTREMITIES:   LEFT LOWER EXTREMITY:  Her knee is in an immobilizer.    ASSESSMENT/PLAN:  Type 2 diabetes.  This is poorly controlled.  I do not see a recent hemoglobin A1c.  However, I am going to increase her 70/30 to 33 U in the morning and leave 25 U at night.    Septic arthritis.  She is completing vancomycin and Rocephin.  Her BUN and creatinine were last checked on 11/22/2013 at 11 and 1.13, respectively.  Vancomycin level on 11/23/2013 was 19.   Atrial fibrillation.  On Coumadin.  Her INR yesterday was 2.2.

## 2013-12-14 ENCOUNTER — Encounter (HOSPITAL_COMMUNITY): Payer: Self-pay | Admitting: Pharmacy Technician

## 2013-12-17 ENCOUNTER — Telehealth: Payer: Self-pay | Admitting: *Deleted

## 2013-12-17 ENCOUNTER — Ambulatory Visit (INDEPENDENT_AMBULATORY_CARE_PROVIDER_SITE_OTHER): Payer: Medicare Other | Admitting: Infectious Diseases

## 2013-12-17 ENCOUNTER — Encounter: Payer: Self-pay | Admitting: Infectious Diseases

## 2013-12-17 VITALS — BP 163/86 | HR 73 | Temp 98.0°F

## 2013-12-17 DIAGNOSIS — M869 Osteomyelitis, unspecified: Secondary | ICD-10-CM | POA: Diagnosis not present

## 2013-12-17 DIAGNOSIS — I2510 Atherosclerotic heart disease of native coronary artery without angina pectoris: Secondary | ICD-10-CM

## 2013-12-17 NOTE — Telephone Encounter (Signed)
Verbal order per Dr. Johnnye Sima given to [place] to continue IV antibiotics until patient's surgery on 12/31/13. Phone # [phone] Myrtis Hopping

## 2013-12-17 NOTE — Assessment & Plan Note (Signed)
She appears to be doing fairly well. I would not stop her anbx (at least) until she has surgery. She has significant warmth and some tenderness in her knee. I have asked her to let us know when she goes to OR so that we can see her in hospital. Her OR findings will be crucial to helping to determine how much longer to give her anbx.  Will ask her SNF to check ESR and CRP at next lab draw.

## 2013-12-17 NOTE — Progress Notes (Signed)
   Subjective:    Patient ID: [name], female    DOB: [birth year], 63 y.o.   MRN: [id number]  HPI 63 y.o. female with history of oesity, DM2,a-fib (on coumadin) and OA of left knee s/p left TKA in 2004, 2005 and 2006 left knee arthroscopy to remove scar tissue, and then 2007 left TKA revision with scar tissue removal.  Over the past year, she has had severe pain to her left knee concerning for loosening of hardware. Due to inflammatory markers being elevated, there was concern for subacute infection. She was admitted to [facility] on 11-05-13 and underwent removal L TKR and antibiotic spacer placement.. She was started on vancomycin and ceftriaxone. Her Cx were (-). Her hospital course was complicated by ARF. She was d/c to SNF on 11-08-13 . Today feels like her knee is better, not as bed. No fever or chills. Feels like her leg is warm. No d/c from her knee, wound well healed.  No problems with PIC or anbx. Has been having difficulty getting blood back from her [facility]. BUN/Cr normal on 12-10-13 She si scheduled for knee re-implantation on 12-31-13.  Review of Systems     Objective:   Physical Exam  Constitutional: She appears well-developed and well-nourished.  Musculoskeletal:       Arms:      Legs:         Assessment & Plan:

## 2013-12-18 NOTE — Progress Notes (Signed)
    Patient ID: [name], female   DOB: 01/02/1951, 63 y.o.   MRN: [id number]  Date: 11/14/13 Facility: [facility]  Code Status:  Full  Chief Complaint  Patient presents with  . Acute Visit    HPI: NP notified of pt experiencing fever x 1. Pt endorses generalized weakness, nausea, and vomiting; onset >12 hours. Pt denies diarrhea, chills, cough, HA, or CP. Pt reports receiving Acetaminophen to relieve fever. Pt and health care team denies further issues/concerns at present time.       Allergies  Allergen Reactions  . Levofloxacin Itching  . Morphine And Related Itching   Medications Reviewed  DATA REVIEWED  Laboratory Studies:Reviewed     Past Medical History  Diagnosis Date  . Hypertension   . Hypercholesteremia   . Cancer 5/10    LUL Vats   . Lung cancer   . Peripheral vascular disease 8/12    Lt SFA PTA  . Sleep apnea     " mild form ,does not use cpap "  . Diabetes mellitus     insulin dependent  . GERD (gastroesophageal reflux disease)   . H/O hiatal hernia   . Arthritis   . Non-STEMI (non-ST elevated myocardial infarction) 04/07/2012    See cath results below  . Presence of stent in LAD coronary artery 04/07/2012    Xience Expedition DES 2.75 mm x 18 mm (dilated to 3.0 mm)  . Paroxysmal atrial fibrillation 04/07/2012    On Warfarin  . Heart palpitations     monitor 04/19/12-05/10/12- frequent PAF with RVR- coumadin started    Review of Systems  Constitutional: Positive for fever.  Respiratory: Negative.   Cardiovascular: Negative.   Gastrointestinal: Positive for nausea.  Genitourinary: Negative.   Musculoskeletal: Negative.   Neurological: Positive for weakness.      Physical Exam  Constitutional: She is oriented to person, place, and time. She appears well-nourished. No distress.  HENT:  Head: Normocephalic.  Eyes: Pupils are equal, round, and reactive to light.  Neck: No JVD present. No thyromegaly present.  Cardiovascular: Normal  rate and regular rhythm.   Pulmonary/Chest: Effort normal and breath sounds normal.  Abdominal: Soft. Bowel sounds are normal.  Lymphadenopathy:    She has no cervical adenopathy.  Neurological: She is alert and oriented to person, place, and time.    ASSESSMENT/PLAN  Flu PCR Blood Cultures x 2 UA/ C&S CBG now V/S q 4 hours w/ Oxygen sats x 48 hours/ Maintain oxygen sats > 93% w/ supplemental oxygen/room air  Follow up:prn

## 2013-12-19 ENCOUNTER — Ambulatory Visit: Payer: Medicare Other | Admitting: Infectious Diseases

## 2013-12-19 ENCOUNTER — Telehealth: Payer: Self-pay | Admitting: *Deleted

## 2013-12-19 NOTE — Telephone Encounter (Signed)
Labs from 3/24 are resulted.  ESR = 75, CRP = 13.1 Results faxed to Dr. Johnnye Sima. [name], RN

## 2013-12-20 NOTE — H&P (Signed)
[name] is an 63 y.o. female.    Procedure:   Reimplantation of left total knee arthroplasty  Chief Complaint:   S/P resection of left total knee arthroplasty do to infection   HPI:    [name], 63 y.o. female, has a history of pain and functional disability in the left knee. The patient reports left knee symptoms including: pain, weakness and stiffness which began 7 year(s) ago following a specific injury (history of left TKA in 2004 per Dr. Marlou [name], 2005 left knee scope to remove scar tissue, 2006 left knee scope for scar tissue, 2007 left TKA revision with scar tissue removal. 2013 bimalleolar ORIF of left ankle - she states the ankle fracture made her left knee pain worse, prior to ORIF she did not have to use a walker/cane).  She presented to the clinic and with suspicion of infection her total knee arthroplasty was resected and an antibiotic spacer was placed on 11/05/2013.  She subsequently had a PICC line placed and was antibiotic for 8 weeks, Dr. Johnnye [name] was following.  Upon return to the clinic we have discussed a reimplantation of a total knee arthroplasty. Risks, benefits and expectations were discussed with the patient.  Risks including but not limited to the risk of anesthesia, blood clots, nerve damage, blood vessel damage, failure of the prosthesis, infection and up to and including death.  We have also discussed possibility that as the surgery progress that if there is still infection in the knee we may wash out the knee and repeat an antibiotic spacer.  We would also need to continue treatment with the PICC line and antibiotic, only if infection was still found in the knee. Patient understand the risks, benefits and expectations and wishes to proceed with surgery.    PCP:  Brandi Seashore, MD  D/C Plans: Home with HHPT/SNF   Post-op Meds:   No Rx given   Tranexamic Acid:     Not to be given - CAD   Decadron:     Not to be given - DM   FYI:  Coumadin with  branching Lovenox post-op   Norco post-op   PMH: Past Medical History  Diagnosis Date  . Hypertension   . Hypercholesteremia   . Cancer 5/10    LUL Vats   . Lung cancer   . Peripheral vascular disease 8/12    Lt SFA PTA  . Sleep apnea     " mild form ,does not use cpap "  . Diabetes mellitus     insulin dependent  . GERD (gastroesophageal reflux disease)   . H/O hiatal hernia   . Arthritis   . Non-STEMI (non-ST elevated myocardial infarction) 04/07/2012    See cath results below  . Presence of stent in LAD coronary artery 04/07/2012    Xience Expedition DES 2.75 mm x 18 mm (dilated to 3.0 mm)  . Paroxysmal atrial fibrillation 04/07/2012    On Warfarin  . Heart palpitations     monitor 04/19/12-05/10/12- frequent PAF with RVR- coumadin started    PSH: Past Surgical History  Procedure Laterality Date  . Abdominal hysterectomy  1960  . Cholecystectomy    . Left knee replacement    . Lung surgery  5/10  . Percutaneous coronary angioplasty and stenting  04/07/2012    Xience Expedition DES 2.34mm x 18 mm (dilated to 3.0 mm) place to the prox LAD   . Orif ankle fracture  07/09/2012    Procedure: OPEN REDUCTION INTERNAL  FIXATION (ORIF) ANKLE FRACTURE;  Surgeon: Brandi Pel, MD;  Location: [facility];  Service: Orthopedics;  Laterality: Left;  open reduction internal fixation trimalleolar ankle fracture medial malleolous fixation  . Cardiac catheterization  11/04/2008    patent RCA, LM, and Circ, nl EF  . Cardiac catheterization  02/20/2002    patent coronaries with the only abnormality being a smooth luminla irregularity in the mid intermediate ramus branch no felt to be hemodynamically significant, nl LV  . Lower extremity angiogram  05/17/11    directional atherectomy to the prox L SFA using a LX Man TurboHawk, ballooned with a [employer] balloon   . Coronary angioplasty with stent placement  04/07/12    LAD  . Total knee revision Left 11/05/2013    Procedure: LEFT TOTAL KNEE  RESECTION;  Surgeon: Mauri Pole, MD;  Location: [facility];  Service: Orthopedics;  Laterality: Left;    Social History:  reports that she quit smoking about 29 years ago. Her smoking use included Cigarettes. She has a 20 pack-year smoking history. She has never used smokeless tobacco. She reports that she does not drink alcohol or use illicit drugs.  Allergies:  Allergies  Allergen Reactions  . Levofloxacin Itching  . Morphine And Related Itching    Medications: No current facility-administered medications for this encounter.   Current Outpatient Prescriptions  Medication Sig Dispense Refill  . albuterol (PROVENTIL HFA;VENTOLIN HFA) 108 (90 BASE) MCG/ACT inhaler Inhale 1-2 puffs into the lungs every 6 (six) hours as needed for wheezing.  1 Inhaler  0  . aspirin 81 MG chewable tablet Chew 81 mg by mouth daily.      Marland Kitchen aspirin EC 81 MG tablet Take 81 mg by mouth every evening.       . cefTRIAXone (ROCEPHIN) 2 G SOLR injection Inject 2 g into the vein daily. For 6 weeks. Started 11/09/13      . cholecalciferol (VITAMIN D) 1000 UNITS tablet Take 1,000 Units by mouth at bedtime.       . docusate sodium 100 MG CAPS Take 100 mg by mouth 2 (two) times daily.  10 capsule  0  . ferrous sulfate 325 (65 FE) MG tablet Take 1 tablet (325 mg total) by mouth 3 (three) times daily after meals.    3  . Heparin Sodium, Porcine, (HEPARIN LOCK FLUSH IJ) 3 mLs as directed. 100/ml 7ml flush. In picc line after ABT      . HYDROcodone-acetaminophen (NORCO) 7.5-325 MG per tablet Take 1-2 tablets by mouth every 4 (four) hours as needed for moderate pain.      Marland Kitchen insulin aspart protamine- aspart (NOVOLOG MIX 70/30) (70-30) 100 UNIT/ML injection Inject 33 Units into the skin daily with breakfast.      . insulin aspart protamine- aspart (NOVOLOG MIX 70/30) (70-30) 100 UNIT/ML injection Inject 25 Units into the skin daily with supper.      . metoCLOPramide (REGLAN) 10 MG tablet Take 5-10 mg by mouth as directed. 5 mg in  the morning then 10mg  in at bedtime if needed for nausea      . metoprolol (LOPRESSOR) 50 MG tablet Take 1 tablet (50 mg total) by mouth 2 (two) times daily.  60 tablet  5  . omeprazole (PRILOSEC) 40 MG capsule Take 40 mg by mouth daily.      . polyethylene glycol (MIRALAX / GLYCOLAX) packet Take 17 g by mouth 2 (two) times daily.  14 each  0  . simvastatin (ZOCOR) 40  MG tablet Take 40 mg by mouth every evening.      Marland Kitchen tiZANidine (ZANAFLEX) 4 MG tablet Take 1 tablet (4 mg total) by mouth every 8 (eight) hours as needed.  30 tablet  0  . traMADol (ULTRAM) 50 MG tablet Take one tablet by mouth four times daily as needed for pain  120 tablet  5  . VANCOMYCIN HCL IV Inject 1,750 mg into the vein daily. For 6 weeks. Started 11/09/13      . warfarin (COUMADIN) 10 MG tablet Take 10 mg by mouth daily. Taken with 2.5mg  to equal 12.5mg       . warfarin (COUMADIN) 2.5 MG tablet Take 2.5 mg by mouth daily. Taken with 10mg  to equal 12.5mg         Review of Systems  Constitutional: Negative.   HENT: Negative.   Eyes: Negative.   Respiratory: Negative.   Cardiovascular: Negative.   Gastrointestinal: Positive for heartburn.  Genitourinary: Negative.   Musculoskeletal: Positive for joint pain.  Skin: Negative.   Neurological: Negative.   Endo/Heme/Allergies: Negative.   Psychiatric/Behavioral: Negative.       Physical Exam  Constitutional: She is oriented to person, place, and time. She appears well-developed and well-nourished.  HENT:  Head: Normocephalic and atraumatic.  Mouth/Throat: Oropharynx is clear and moist.  Eyes: Pupils are equal, round, and reactive to light.  Neck: Neck supple. No JVD present. No tracheal deviation present. No thyromegaly present.  Cardiovascular: Normal rate, regular rhythm, normal heart sounds and intact distal pulses.   Respiratory: Effort normal and breath sounds normal. No stridor. No respiratory distress. She has no wheezes.  GI: Soft. There is no tenderness.  There is no guarding.  Musculoskeletal:       Left knee: She exhibits decreased range of motion, swelling, laceration (healed) and bony tenderness. She exhibits no ecchymosis, no deformity and no erythema. Tenderness found.  Lymphadenopathy:    She has no cervical adenopathy.  Neurological: She is alert and oriented to person, place, and time.  Skin: Skin is warm and dry.  Psychiatric: She has a normal mood and affect.       Assessment/Plan Assessment:    S/P resection of left total knee arthroplasty do to infection  Plan: Patient will undergo a reimplantation of left total knee arthroplasty on 12/31/2013 per Dr. Alvan Dame at [facility]. Risks benefits and expectations were discussed with the patient. Patient understand risks, benefits and expectations and wishes to proceed.      [place]   PAC  12/20/2013, 3:57 PM

## 2013-12-21 ENCOUNTER — Encounter (HOSPITAL_COMMUNITY)
Admission: RE | Admit: 2013-12-21 | Discharge: 2013-12-21 | Disposition: A | Discharge status: Home / Self Care | Payer: Medicare Other | Source: Ambulatory Visit | Attending: Orthopedic Surgery | Admitting: Orthopedic Surgery

## 2013-12-21 ENCOUNTER — Encounter (HOSPITAL_COMMUNITY): Payer: Self-pay

## 2013-12-21 DIAGNOSIS — Z01812 Encounter for preprocedural laboratory examination: Secondary | ICD-10-CM | POA: Insufficient documentation

## 2013-12-21 LAB — URINALYSIS, ROUTINE W REFLEX MICROSCOPIC
BILIRUBIN URINE: NEGATIVE
Glucose, UA: NEGATIVE mg/dL
HGB URINE DIPSTICK: NEGATIVE
Ketones, ur: NEGATIVE mg/dL
Leukocytes, UA: NEGATIVE
Nitrite: NEGATIVE
Protein, ur: NEGATIVE mg/dL
Specific Gravity, Urine: 1.014 (ref 1.005–1.030)
UROBILINOGEN UA: 0.2 mg/dL (ref 0.0–1.0)
pH: 6.5 (ref 5.0–8.0)

## 2013-12-21 LAB — PROTIME-INR
INR: 1.85 — AB (ref 0.00–1.49)
Prothrombin Time: 20.8 seconds — ABNORMAL HIGH (ref 11.6–15.2)

## 2013-12-21 LAB — CBC
HCT: 35.2 % — ABNORMAL LOW (ref 36.0–46.0)
Hemoglobin: 11.5 g/dL — ABNORMAL LOW (ref 12.0–15.0)
MCH: 28.4 pg (ref 26.0–34.0)
MCHC: 32.7 g/dL (ref 30.0–36.0)
MCV: 86.9 fL (ref 78.0–100.0)
Platelets: 270 10*3/uL (ref 150–400)
RBC: 4.05 MIL/uL (ref 3.87–5.11)
RDW: 13.1 % (ref 11.5–15.5)
WBC: 7.4 10*3/uL (ref 4.0–10.5)

## 2013-12-21 LAB — SURGICAL PCR SCREEN
MRSA, PCR: NEGATIVE
Staphylococcus aureus: NEGATIVE

## 2013-12-21 LAB — BASIC METABOLIC PANEL
BUN: 12 mg/dL (ref 6–23)
CALCIUM: 10.2 mg/dL (ref 8.4–10.5)
CO2: 28 mEq/L (ref 19–32)
Chloride: 97 mEq/L (ref 96–112)
Creatinine, Ser: 0.81 mg/dL (ref 0.50–1.10)
GFR, EST AFRICAN AMERICAN: 87 mL/min — AB (ref >90)
GFR, EST NON AFRICAN AMERICAN: 75 mL/min — AB (ref >90)
Glucose, Bld: 162 mg/dL — ABNORMAL HIGH (ref 70–99)
Potassium: 4.2 mEq/L (ref 3.7–5.3)
Sodium: 135 mEq/L — ABNORMAL LOW (ref 137–147)

## 2013-12-21 LAB — APTT: aPTT: 36 seconds (ref 24–37)

## 2013-12-21 NOTE — Patient Instructions (Signed)
[place]  12/21/2013   Your procedure is scheduled on: 12/31/13  Report to [facility] at 10:30 AM.  Call this number if you have problems the morning of surgery [phone]: [phone]   Remember: please bring inhaler on day of surgery, Please do NOT take insulin on day of surgery.    Do not eat food After Midnight, clear liquids from midnight until 7:05 am on 12/31/13 then nothing.      Take these medicines the morning of surgery with A SIP OF WATER: hydrocodone if needed, prilosec, lopressor   Do not wear jewelry, make-up or nail polish.  Do not wear lotions, powders, or perfumes. You may wear deodorant.  Do not shave 48 hours prior to surgery. Men may shave face and neck.  Do not bring valuables to the hospital.  Contacts, dentures or bridgework may not be worn into surgery.  Leave suitcase in the car. After surgery it may be brought to your room.  For patients admitted to the hospital, checkout time is 11:00 AM the day of discharge.    Please read over the following fact sheets that you were given:[facility] preparing for surgery sheet, MRSA Information, incentive spirometry fact sheet, blood fact sheet  Paulette Blanch, RN  pre op nurse call if needed [phone]    FAILURE TO [place]   Patient Signature: ___________________________________________

## 2013-12-21 NOTE — Progress Notes (Signed)
CT chest 09/12/13 on EPIC, EKG 06/28/13 on EPIC

## 2013-12-26 DIAGNOSIS — M25569 Pain in unspecified knee: Secondary | ICD-10-CM | POA: Diagnosis not present

## 2013-12-26 DIAGNOSIS — E119 Type 2 diabetes mellitus without complications: Secondary | ICD-10-CM | POA: Diagnosis not present

## 2013-12-26 DIAGNOSIS — R262 Difficulty in walking, not elsewhere classified: Secondary | ICD-10-CM | POA: Diagnosis not present

## 2013-12-26 DIAGNOSIS — I2109 ST elevation (STEMI) myocardial infarction involving other coronary artery of anterior wall: Secondary | ICD-10-CM | POA: Diagnosis not present

## 2013-12-26 DIAGNOSIS — I4891 Unspecified atrial fibrillation: Secondary | ICD-10-CM | POA: Diagnosis not present

## 2013-12-26 DIAGNOSIS — D62 Acute posthemorrhagic anemia: Secondary | ICD-10-CM | POA: Diagnosis not present

## 2013-12-26 DIAGNOSIS — M21869 Other specified acquired deformities of unspecified lower leg: Secondary | ICD-10-CM | POA: Diagnosis not present

## 2013-12-26 DIAGNOSIS — R918 Other nonspecific abnormal finding of lung field: Secondary | ICD-10-CM | POA: Diagnosis not present

## 2013-12-26 DIAGNOSIS — M6281 Muscle weakness (generalized): Secondary | ICD-10-CM | POA: Diagnosis not present

## 2013-12-26 DIAGNOSIS — G4730 Sleep apnea, unspecified: Secondary | ICD-10-CM | POA: Diagnosis not present

## 2013-12-26 DIAGNOSIS — Z6841 Body Mass Index (BMI) 40.0 and over, adult: Secondary | ICD-10-CM | POA: Diagnosis not present

## 2013-12-26 DIAGNOSIS — T8450XA Infection and inflammatory reaction due to unspecified internal joint prosthesis, initial encounter: Secondary | ICD-10-CM | POA: Diagnosis not present

## 2013-12-26 DIAGNOSIS — Z96659 Presence of unspecified artificial knee joint: Secondary | ICD-10-CM | POA: Diagnosis not present

## 2013-12-26 DIAGNOSIS — I10 Essential (primary) hypertension: Secondary | ICD-10-CM | POA: Diagnosis not present

## 2013-12-26 DIAGNOSIS — Z452 Encounter for adjustment and management of vascular access device: Secondary | ICD-10-CM | POA: Diagnosis not present

## 2013-12-30 MED ORDER — DEXTROSE 5 % IV SOLN
3.0000 g | INTRAVENOUS | Status: AC
  Administered 2013-12-31: 3 g via INTRAVENOUS
  Filled 2013-12-30: qty 3000

## 2013-12-31 ENCOUNTER — Inpatient Hospital Stay (HOSPITAL_COMMUNITY)
Admission: RE | Admit: 2013-12-31 | Discharge: 2014-01-03 | DRG: 467 | Disposition: A | Discharge status: Skilled Nursing Facility | Payer: Medicare Other | Source: Ambulatory Visit | Attending: Orthopedic Surgery | Admitting: Orthopedic Surgery

## 2013-12-31 ENCOUNTER — Inpatient Hospital Stay (HOSPITAL_COMMUNITY): Payer: Medicare Other | Admitting: Anesthesiology

## 2013-12-31 ENCOUNTER — Inpatient Hospital Stay (HOSPITAL_COMMUNITY): Payer: Medicare Other

## 2013-12-31 ENCOUNTER — Encounter (HOSPITAL_COMMUNITY)
Admission: RE | Disposition: A | Discharge status: Skilled Nursing Facility | Payer: Self-pay | Source: Ambulatory Visit | Attending: Orthopedic Surgery

## 2013-12-31 ENCOUNTER — Encounter (HOSPITAL_COMMUNITY): Payer: Medicare Other | Admitting: Anesthesiology

## 2013-12-31 ENCOUNTER — Encounter (HOSPITAL_COMMUNITY): Payer: Self-pay | Admitting: *Deleted

## 2013-12-31 DIAGNOSIS — G4730 Sleep apnea, unspecified: Secondary | ICD-10-CM | POA: Diagnosis present

## 2013-12-31 DIAGNOSIS — I10 Essential (primary) hypertension: Secondary | ICD-10-CM | POA: Diagnosis present

## 2013-12-31 DIAGNOSIS — R110 Nausea: Secondary | ICD-10-CM | POA: Diagnosis not present

## 2013-12-31 DIAGNOSIS — M129 Arthropathy, unspecified: Secondary | ICD-10-CM | POA: Diagnosis present

## 2013-12-31 DIAGNOSIS — Z881 Allergy status to other antibiotic agents status: Secondary | ICD-10-CM

## 2013-12-31 DIAGNOSIS — Z01812 Encounter for preprocedural laboratory examination: Secondary | ICD-10-CM

## 2013-12-31 DIAGNOSIS — I4891 Unspecified atrial fibrillation: Secondary | ICD-10-CM | POA: Diagnosis present

## 2013-12-31 DIAGNOSIS — Z85118 Personal history of other malignant neoplasm of bronchus and lung: Secondary | ICD-10-CM

## 2013-12-31 DIAGNOSIS — Z6841 Body Mass Index (BMI) 40.0 and over, adult: Secondary | ICD-10-CM

## 2013-12-31 DIAGNOSIS — M25569 Pain in unspecified knee: Secondary | ICD-10-CM | POA: Diagnosis not present

## 2013-12-31 DIAGNOSIS — Y831 Surgical operation with implant of artificial internal device as the cause of abnormal reaction of the patient, or of later complication, without mention of misadventure at the time of the procedure: Secondary | ICD-10-CM | POA: Diagnosis present

## 2013-12-31 DIAGNOSIS — R002 Palpitations: Secondary | ICD-10-CM | POA: Diagnosis present

## 2013-12-31 DIAGNOSIS — M21869 Other specified acquired deformities of unspecified lower leg: Principal | ICD-10-CM | POA: Diagnosis present

## 2013-12-31 DIAGNOSIS — R918 Other nonspecific abnormal finding of lung field: Secondary | ICD-10-CM | POA: Diagnosis not present

## 2013-12-31 DIAGNOSIS — S8990XA Unspecified injury of unspecified lower leg, initial encounter: Secondary | ICD-10-CM | POA: Diagnosis not present

## 2013-12-31 DIAGNOSIS — K219 Gastro-esophageal reflux disease without esophagitis: Secondary | ICD-10-CM | POA: Diagnosis present

## 2013-12-31 DIAGNOSIS — Z885 Allergy status to narcotic agent status: Secondary | ICD-10-CM

## 2013-12-31 DIAGNOSIS — Z471 Aftercare following joint replacement surgery: Secondary | ICD-10-CM | POA: Diagnosis not present

## 2013-12-31 DIAGNOSIS — E7800 Pure hypercholesterolemia, unspecified: Secondary | ICD-10-CM | POA: Diagnosis present

## 2013-12-31 DIAGNOSIS — Z87891 Personal history of nicotine dependence: Secondary | ICD-10-CM

## 2013-12-31 DIAGNOSIS — I2510 Atherosclerotic heart disease of native coronary artery without angina pectoris: Secondary | ICD-10-CM | POA: Diagnosis present

## 2013-12-31 DIAGNOSIS — I252 Old myocardial infarction: Secondary | ICD-10-CM

## 2013-12-31 DIAGNOSIS — R262 Difficulty in walking, not elsewhere classified: Secondary | ICD-10-CM | POA: Diagnosis not present

## 2013-12-31 DIAGNOSIS — Z8249 Family history of ischemic heart disease and other diseases of the circulatory system: Secondary | ICD-10-CM | POA: Diagnosis not present

## 2013-12-31 DIAGNOSIS — Z7982 Long term (current) use of aspirin: Secondary | ICD-10-CM

## 2013-12-31 DIAGNOSIS — D500 Iron deficiency anemia secondary to blood loss (chronic): Secondary | ICD-10-CM | POA: Diagnosis present

## 2013-12-31 DIAGNOSIS — E119 Type 2 diabetes mellitus without complications: Secondary | ICD-10-CM | POA: Diagnosis present

## 2013-12-31 DIAGNOSIS — S99919A Unspecified injury of unspecified ankle, initial encounter: Secondary | ICD-10-CM | POA: Diagnosis not present

## 2013-12-31 DIAGNOSIS — Z452 Encounter for adjustment and management of vascular access device: Secondary | ICD-10-CM | POA: Diagnosis not present

## 2013-12-31 DIAGNOSIS — E669 Obesity, unspecified: Secondary | ICD-10-CM | POA: Diagnosis present

## 2013-12-31 DIAGNOSIS — IMO0001 Reserved for inherently not codable concepts without codable children: Secondary | ICD-10-CM | POA: Diagnosis not present

## 2013-12-31 DIAGNOSIS — Z7901 Long term (current) use of anticoagulants: Secondary | ICD-10-CM | POA: Diagnosis not present

## 2013-12-31 DIAGNOSIS — D62 Acute posthemorrhagic anemia: Secondary | ICD-10-CM | POA: Diagnosis not present

## 2013-12-31 DIAGNOSIS — T8450XA Infection and inflammatory reaction due to unspecified internal joint prosthesis, initial encounter: Secondary | ICD-10-CM | POA: Diagnosis not present

## 2013-12-31 DIAGNOSIS — I739 Peripheral vascular disease, unspecified: Secondary | ICD-10-CM | POA: Diagnosis present

## 2013-12-31 DIAGNOSIS — M6281 Muscle weakness (generalized): Secondary | ICD-10-CM | POA: Diagnosis not present

## 2013-12-31 DIAGNOSIS — Z794 Long term (current) use of insulin: Secondary | ICD-10-CM | POA: Diagnosis not present

## 2013-12-31 DIAGNOSIS — Z96659 Presence of unspecified artificial knee joint: Secondary | ICD-10-CM | POA: Diagnosis not present

## 2013-12-31 DIAGNOSIS — Z79899 Other long term (current) drug therapy: Secondary | ICD-10-CM

## 2013-12-31 HISTORY — PX: TOTAL KNEE REVISION: SHX996

## 2013-12-31 LAB — GLUCOSE, CAPILLARY
GLUCOSE-CAPILLARY: 125 mg/dL — AB (ref 70–99)
Glucose-Capillary: 230 mg/dL — ABNORMAL HIGH (ref 70–99)

## 2013-12-31 LAB — PROTIME-INR
INR: 1.04 (ref 0.00–1.49)
PROTHROMBIN TIME: 13.4 s (ref 11.6–15.2)

## 2013-12-31 SURGERY — TOTAL KNEE REVISION
Anesthesia: General | Site: Knee | Laterality: Left

## 2013-12-31 MED ORDER — INSULIN ASPART 100 UNIT/ML ~~LOC~~ SOLN
SUBCUTANEOUS | Status: AC
  Filled 2013-12-31: qty 1

## 2013-12-31 MED ORDER — HYDROMORPHONE HCL PF 1 MG/ML IJ SOLN
INTRAMUSCULAR | Status: DC | PRN
  Administered 2013-12-31 (×2): 1 mg via INTRAVENOUS

## 2013-12-31 MED ORDER — HYDROMORPHONE HCL PF 2 MG/ML IJ SOLN
INTRAMUSCULAR | Status: AC
Start: 2013-12-31 — End: 2013-12-31
  Filled 2013-12-31: qty 1

## 2013-12-31 MED ORDER — FENTANYL CITRATE 0.05 MG/ML IJ SOLN
INTRAMUSCULAR | Status: AC
  Filled 2013-12-31: qty 2

## 2013-12-31 MED ORDER — FENTANYL CITRATE 0.05 MG/ML IJ SOLN
INTRAMUSCULAR | Status: DC | PRN
  Administered 2013-12-31: 50 ug via INTRAVENOUS
  Administered 2013-12-31 (×2): 100 ug via INTRAVENOUS
  Administered 2013-12-31 (×3): 50 ug via INTRAVENOUS

## 2013-12-31 MED ORDER — PROPOFOL 10 MG/ML IV BOLUS
INTRAVENOUS | Status: DC | PRN
  Administered 2013-12-31: 200 mg via INTRAVENOUS

## 2013-12-31 MED ORDER — PANTOPRAZOLE SODIUM 40 MG PO TBEC
80.0000 mg | DELAYED_RELEASE_TABLET | Freq: Every day | ORAL | Status: DC
  Administered 2014-01-01 – 2014-01-03 (×3): 80 mg via ORAL
  Filled 2013-12-31 (×3): qty 2

## 2013-12-31 MED ORDER — SIMVASTATIN 40 MG PO TABS
40.0000 mg | ORAL_TABLET | Freq: Every evening | ORAL | Status: DC
  Administered 2014-01-01 – 2014-01-03 (×3): 40 mg via ORAL
  Filled 2013-12-31 (×3): qty 1

## 2013-12-31 MED ORDER — LIDOCAINE HCL (CARDIAC) 20 MG/ML IV SOLN
INTRAVENOUS | Status: DC | PRN
  Administered 2013-12-31: 100 mg via INTRAVENOUS

## 2013-12-31 MED ORDER — SODIUM CHLORIDE 0.9 % IV SOLN
1500.0000 mg | Freq: Once | INTRAVENOUS | Status: AC
Start: 2013-12-31 — End: 2013-12-31
  Administered 2013-12-31: 1500 mg via INTRAVENOUS
  Filled 2013-12-31: qty 1500

## 2013-12-31 MED ORDER — ROCURONIUM BROMIDE 100 MG/10ML IV SOLN
INTRAVENOUS | Status: DC | PRN
  Administered 2013-12-31: 20 mg via INTRAVENOUS

## 2013-12-31 MED ORDER — BUPIVACAINE-EPINEPHRINE PF 0.25-1:200000 % IJ SOLN
INTRAMUSCULAR | Status: AC
  Filled 2013-12-31: qty 30

## 2013-12-31 MED ORDER — ALUM & MAG HYDROXIDE-SIMETH 200-200-20 MG/5ML PO SUSP
30.0000 mL | ORAL | Status: DC | PRN
Start: 2013-12-31 — End: 2014-01-04
  Administered 2014-01-02: 30 mL via ORAL
  Filled 2013-12-31 (×2): qty 30

## 2013-12-31 MED ORDER — MEPERIDINE HCL 50 MG/ML IJ SOLN: 6.2500 mg | INTRAMUSCULAR | Status: DC | PRN

## 2013-12-31 MED ORDER — CEFAZOLIN SODIUM-DEXTROSE 2-3 GM-% IV SOLR
2.0000 g | Freq: Four times a day (QID) | INTRAVENOUS | Status: AC
  Administered 2013-12-31 – 2014-01-01 (×2): 2 g via INTRAVENOUS
  Filled 2013-12-31 (×2): qty 50

## 2013-12-31 MED ORDER — INSULIN ASPART PROT & ASPART (70-30 MIX) 100 UNIT/ML ~~LOC~~ SUSP
25.0000 [IU] | Freq: Every day | SUBCUTANEOUS | Status: DC
  Administered 2014-01-02: 25 [IU] via SUBCUTANEOUS
  Administered 2014-01-03: 10 [IU] via SUBCUTANEOUS
  Filled 2013-12-31: qty 10

## 2013-12-31 MED ORDER — GLYCOPYRROLATE 0.2 MG/ML IJ SOLN
INTRAMUSCULAR | Status: DC | PRN
  Administered 2013-12-31: 0.6 mg via INTRAVENOUS

## 2013-12-31 MED ORDER — INSULIN ASPART PROT & ASPART (70-30 MIX) 100 UNIT/ML ~~LOC~~ SUSP
33.0000 [IU] | Freq: Every day | SUBCUTANEOUS | Status: DC
  Administered 2014-01-01 – 2014-01-02 (×2): 33 [IU] via SUBCUTANEOUS

## 2013-12-31 MED ORDER — GLYCOPYRROLATE 0.2 MG/ML IJ SOLN
INTRAMUSCULAR | Status: AC
  Filled 2013-12-31: qty 3

## 2013-12-31 MED ORDER — HYDROMORPHONE HCL PF 1 MG/ML IJ SOLN
0.5000 mg | INTRAMUSCULAR | Status: DC | PRN
  Administered 2013-12-31 – 2014-01-01 (×2): 1 mg via INTRAVENOUS
  Filled 2013-12-31: qty 1

## 2013-12-31 MED ORDER — PHENOL 1.4 % MT LIQD
1.0000 | OROMUCOSAL | Status: DC | PRN
  Filled 2013-12-31: qty 177

## 2013-12-31 MED ORDER — LABETALOL HCL 5 MG/ML IV SOLN
INTRAVENOUS | Status: DC | PRN
  Administered 2013-12-31 (×2): 5 mg via INTRAVENOUS

## 2013-12-31 MED ORDER — ENOXAPARIN SODIUM 40 MG/0.4ML ~~LOC~~ SOLN
40.0000 mg | Freq: Two times a day (BID) | SUBCUTANEOUS | Status: DC
  Administered 2014-01-01 – 2014-01-03 (×5): 40 mg via SUBCUTANEOUS
  Filled 2013-12-31 (×8): qty 0.4

## 2013-12-31 MED ORDER — FERROUS SULFATE 325 (65 FE) MG PO TABS
325.0000 mg | ORAL_TABLET | Freq: Three times a day (TID) | ORAL | Status: DC
  Administered 2014-01-01 – 2014-01-03 (×7): 325 mg via ORAL
  Filled 2013-12-31 (×10): qty 1

## 2013-12-31 MED ORDER — SODIUM CHLORIDE 0.9 % IJ SOLN
INTRAMUSCULAR | Status: AC
  Filled 2013-12-31: qty 50

## 2013-12-31 MED ORDER — LIDOCAINE HCL (CARDIAC) 20 MG/ML IV SOLN
INTRAVENOUS | Status: AC
  Filled 2013-12-31: qty 5

## 2013-12-31 MED ORDER — DIPHENHYDRAMINE HCL 25 MG PO CAPS: 25.0000 mg | ORAL_CAPSULE | Freq: Four times a day (QID) | ORAL | Status: DC | PRN

## 2013-12-31 MED ORDER — POLYETHYLENE GLYCOL 3350 17 G PO PACK
17.0000 g | PACK | Freq: Two times a day (BID) | ORAL | Status: DC
  Administered 2014-01-01 – 2014-01-03 (×4): 17 g via ORAL

## 2013-12-31 MED ORDER — ONDANSETRON HCL 4 MG PO TABS
4.0000 mg | ORAL_TABLET | Freq: Four times a day (QID) | ORAL | Status: DC | PRN
Start: 2013-12-31 — End: 2014-01-04
  Administered 2014-01-03: 4 mg via ORAL
  Filled 2013-12-31 (×2): qty 1

## 2013-12-31 MED ORDER — KETAMINE HCL 10 MG/ML IJ SOLN
INTRAMUSCULAR | Status: AC
  Filled 2013-12-31: qty 1

## 2013-12-31 MED ORDER — MIDAZOLAM HCL 5 MG/5ML IJ SOLN
INTRAMUSCULAR | Status: DC | PRN
  Administered 2013-12-31: 2 mg via INTRAVENOUS

## 2013-12-31 MED ORDER — HYDROCODONE-ACETAMINOPHEN 7.5-325 MG PO TABS
1.0000 | ORAL_TABLET | ORAL | Status: DC
  Administered 2014-01-01: 1 via ORAL
  Administered 2014-01-01 – 2014-01-02 (×3): 2 via ORAL
  Filled 2013-12-31 (×5): qty 2
  Filled 2013-12-31: qty 1

## 2013-12-31 MED ORDER — BUPIVACAINE LIPOSOME 1.3 % IJ SUSP
20.0000 mL | Freq: Once | INTRAMUSCULAR | Status: DC
  Filled 2013-12-31: qty 20

## 2013-12-31 MED ORDER — BUPIVACAINE-EPINEPHRINE PF 0.25-1:200000 % IJ SOLN
INTRAMUSCULAR | Status: DC | PRN
  Administered 2013-12-31: 25 mL

## 2013-12-31 MED ORDER — SUCCINYLCHOLINE CHLORIDE 20 MG/ML IJ SOLN
INTRAMUSCULAR | Status: DC | PRN
  Administered 2013-12-31: 100 mg via INTRAVENOUS

## 2013-12-31 MED ORDER — METOCLOPRAMIDE HCL 10 MG PO TABS: 5.0000 mg | ORAL_TABLET | Freq: Three times a day (TID) | ORAL | Status: DC | PRN

## 2013-12-31 MED ORDER — METHOCARBAMOL 100 MG/ML IJ SOLN
500.0000 mg | Freq: Four times a day (QID) | INTRAVENOUS | Status: DC | PRN
  Administered 2013-12-31: 500 mg via INTRAVENOUS
  Filled 2013-12-31: qty 5

## 2013-12-31 MED ORDER — KETAMINE HCL 10 MG/ML IJ SOLN
INTRAMUSCULAR | Status: DC | PRN
  Administered 2013-12-31: 10 mg via INTRAVENOUS
  Administered 2013-12-31: 5 mg via INTRAVENOUS
  Administered 2013-12-31: 10 mg via INTRAVENOUS

## 2013-12-31 MED ORDER — LABETALOL HCL 5 MG/ML IV SOLN
INTRAVENOUS | Status: AC
  Filled 2013-12-31: qty 4

## 2013-12-31 MED ORDER — SODIUM CHLORIDE 0.9 % IJ SOLN
INTRAMUSCULAR | Status: DC | PRN
  Administered 2013-12-31: 14 mL

## 2013-12-31 MED ORDER — ACETAMINOPHEN 10 MG/ML IV SOLN
1000.0000 mg | Freq: Once | INTRAVENOUS | Status: AC
  Administered 2013-12-31: 1000 mg via INTRAVENOUS
  Filled 2013-12-31: qty 100

## 2013-12-31 MED ORDER — ALBUTEROL SULFATE (2.5 MG/3ML) 0.083% IN NEBU: 3.0000 mL | INHALATION_SOLUTION | Freq: Four times a day (QID) | RESPIRATORY_TRACT | Status: DC | PRN

## 2013-12-31 MED ORDER — ONDANSETRON HCL 4 MG/2ML IJ SOLN
INTRAMUSCULAR | Status: AC
  Filled 2013-12-31: qty 2

## 2013-12-31 MED ORDER — METOCLOPRAMIDE HCL 5 MG/ML IJ SOLN
5.0000 mg | Freq: Three times a day (TID) | INTRAMUSCULAR | Status: DC | PRN
  Administered 2013-12-31 – 2014-01-01 (×2): 10 mg via INTRAVENOUS
  Filled 2013-12-31 (×2): qty 2

## 2013-12-31 MED ORDER — PROMETHAZINE HCL 25 MG/ML IJ SOLN: 6.2500 mg | INTRAMUSCULAR | Status: DC | PRN

## 2013-12-31 MED ORDER — NEOSTIGMINE METHYLSULFATE 1 MG/ML IJ SOLN
INTRAMUSCULAR | Status: AC
  Filled 2013-12-31: qty 10

## 2013-12-31 MED ORDER — ONDANSETRON HCL 4 MG/2ML IJ SOLN
4.0000 mg | Freq: Four times a day (QID) | INTRAMUSCULAR | Status: DC | PRN
Start: 2013-12-31 — End: 2014-01-04
  Administered 2013-12-31 – 2014-01-01 (×2): 4 mg via INTRAVENOUS
  Filled 2013-12-31 (×2): qty 2

## 2013-12-31 MED ORDER — MIDAZOLAM HCL 2 MG/2ML IJ SOLN
INTRAMUSCULAR | Status: AC
  Filled 2013-12-31: qty 2

## 2013-12-31 MED ORDER — ONDANSETRON HCL 4 MG/2ML IJ SOLN
INTRAMUSCULAR | Status: DC | PRN
  Administered 2013-12-31: 4 mg via INTRAVENOUS

## 2013-12-31 MED ORDER — BUPIVACAINE LIPOSOME 1.3 % IJ SUSP
INTRAMUSCULAR | Status: DC | PRN
  Administered 2013-12-31: 20 mL

## 2013-12-31 MED ORDER — EPHEDRINE SULFATE 50 MG/ML IJ SOLN
INTRAMUSCULAR | Status: DC | PRN
  Administered 2013-12-31: 10 mg via INTRAVENOUS

## 2013-12-31 MED ORDER — SODIUM CHLORIDE 0.9 % IR SOLN
Status: DC | PRN
  Administered 2013-12-31: 4000 mL

## 2013-12-31 MED ORDER — KETOROLAC TROMETHAMINE 30 MG/ML IJ SOLN
INTRAMUSCULAR | Status: DC | PRN
  Administered 2013-12-31: 40 mg

## 2013-12-31 MED ORDER — WARFARIN - PHARMACIST DOSING INPATIENT: Freq: Every day | Status: DC

## 2013-12-31 MED ORDER — METHOCARBAMOL 500 MG PO TABS
500.0000 mg | ORAL_TABLET | Freq: Four times a day (QID) | ORAL | Status: DC | PRN
  Administered 2014-01-01 – 2014-01-03 (×4): 500 mg via ORAL
  Filled 2013-12-31 (×4): qty 1

## 2013-12-31 MED ORDER — FENTANYL CITRATE 0.05 MG/ML IJ SOLN
25.0000 ug | INTRAMUSCULAR | Status: DC | PRN
  Administered 2013-12-31: 25 ug via INTRAVENOUS
  Administered 2013-12-31: 50 ug via INTRAVENOUS

## 2013-12-31 MED ORDER — BISACODYL 10 MG RE SUPP: 10.0000 mg | Freq: Every day | RECTAL | Status: DC | PRN

## 2013-12-31 MED ORDER — DOCUSATE SODIUM 100 MG PO CAPS
100.0000 mg | ORAL_CAPSULE | Freq: Two times a day (BID) | ORAL | Status: DC
  Administered 2014-01-01 – 2014-01-03 (×4): 100 mg via ORAL

## 2013-12-31 MED ORDER — MENTHOL 3 MG MT LOZG
1.0000 | LOZENGE | OROMUCOSAL | Status: DC | PRN
  Filled 2013-12-31: qty 9

## 2013-12-31 MED ORDER — POTASSIUM CHLORIDE 2 MEQ/ML IV SOLN
INTRAVENOUS | Status: DC
  Administered 2013-12-31 – 2014-01-02 (×4): via INTRAVENOUS
  Filled 2013-12-31 (×12): qty 1000

## 2013-12-31 MED ORDER — ASPIRIN 81 MG PO CHEW
81.0000 mg | CHEWABLE_TABLET | Freq: Every day | ORAL | Status: DC
  Administered 2014-01-01 – 2014-01-03 (×3): 81 mg via ORAL
  Filled 2013-12-31 (×4): qty 1

## 2013-12-31 MED ORDER — METOPROLOL TARTRATE 50 MG PO TABS
50.0000 mg | ORAL_TABLET | Freq: Two times a day (BID) | ORAL | Status: DC
  Administered 2014-01-01 – 2014-01-03 (×6): 50 mg via ORAL
  Filled 2013-12-31 (×7): qty 1

## 2013-12-31 MED ORDER — INSULIN ASPART 100 UNIT/ML ~~LOC~~ SOLN
0.0000 [IU] | Freq: Three times a day (TID) | SUBCUTANEOUS | Status: DC
  Administered 2013-12-31: 5 [IU] via SUBCUTANEOUS
  Administered 2014-01-01: 8 [IU] via SUBCUTANEOUS
  Administered 2014-01-03: 2 [IU] via SUBCUTANEOUS

## 2013-12-31 MED ORDER — KETOROLAC TROMETHAMINE 30 MG/ML IJ SOLN
INTRAMUSCULAR | Status: AC
  Filled 2013-12-31: qty 1

## 2013-12-31 MED ORDER — PROPOFOL 10 MG/ML IV BOLUS
INTRAVENOUS | Status: AC
  Filled 2013-12-31: qty 20

## 2013-12-31 MED ORDER — NEOSTIGMINE METHYLSULFATE 1 MG/ML IJ SOLN
INTRAMUSCULAR | Status: DC | PRN
  Administered 2013-12-31: 4 mg via INTRAVENOUS

## 2013-12-31 MED ORDER — MAGNESIUM CITRATE PO SOLN: 1.0000 | Freq: Once | ORAL | Status: AC | PRN

## 2013-12-31 MED ORDER — LACTATED RINGERS IV SOLN
INTRAVENOUS | Status: DC
  Administered 2013-12-31: 16:00:00 via INTRAVENOUS
  Administered 2013-12-31 (×2): 1000 mL via INTRAVENOUS

## 2013-12-31 MED ORDER — WARFARIN SODIUM 2.5 MG PO TABS
12.5000 mg | ORAL_TABLET | Freq: Once | ORAL | Status: AC
  Filled 2013-12-31: qty 1

## 2013-12-31 MED ORDER — CELECOXIB 200 MG PO CAPS
200.0000 mg | ORAL_CAPSULE | Freq: Two times a day (BID) | ORAL | Status: DC
  Filled 2013-12-31 (×3): qty 1

## 2013-12-31 SURGICAL SUPPLY — 92 items
ADAPTER BOLT FEMORAL +2/-2 (Knees) ×2 IMPLANT
ADH SKN CLS APL DERMABOND .7 (GAUZE/BANDAGES/DRESSINGS)
ADPR FEM +2/-2 OFST BOLT (Knees) ×1 IMPLANT
ADPR FEM 5D STRL KN PFC SGM (Orthopedic Implant) ×1 IMPLANT
AUG FEM SZ3 4 CMB POST STRL LF (Knees) ×1 IMPLANT
AUG FEM SZ3 4 STRL LF KN LT TI (Knees) ×1 IMPLANT
AUG FEM SZ3 8 CMB POST STRL LF (Knees) ×1 IMPLANT
AUG FEM SZ3 8 DIST STRL KN LT (Knees) ×1 IMPLANT
AUG TIB SZ2 10 REV STP WDG (Knees) ×2 IMPLANT
AUGMENT DIST PFC 8MM (Knees) ×1 IMPLANT
BAG SPEC THK2 15X12 ZIP CLS (MISCELLANEOUS)
BAG ZIPLOCK 12X15 (MISCELLANEOUS) IMPLANT
BANDAGE ELASTIC 6 VELCRO ST LF (GAUZE/BANDAGES/DRESSINGS) ×2 IMPLANT
BANDAGE ESMARK 6X9 LF (GAUZE/BANDAGES/DRESSINGS) ×1 IMPLANT
BLADE SAW SGTL 13.0X1.19X90.0M (BLADE) ×2 IMPLANT
BLADE SAW SGTL 81X20 HD (BLADE) ×2 IMPLANT
BNDG CMPR 9X6 STRL LF SNTH (GAUZE/BANDAGES/DRESSINGS) ×1
BNDG ESMARK 6X9 LF (GAUZE/BANDAGES/DRESSINGS) ×2
BONE CEMENT GENTAMICIN (Cement) ×10 IMPLANT
BOWL SMART MIX CTS (DISPOSABLE) IMPLANT
BRUSH FEMORAL CANAL (MISCELLANEOUS) ×2 IMPLANT
CEMENT BONE GENTAMICIN 40 (Cement) ×5 IMPLANT
CEMENT RESTRICTOR DEPUY SZ 4 (Cement) ×2 IMPLANT
CUFF TOURN SGL QUICK 34 (TOURNIQUET CUFF) ×2
CUFF TRNQT CYL 34X4X40X1 (TOURNIQUET CUFF) ×1 IMPLANT
DERMABOND ADVANCED (GAUZE/BANDAGES/DRESSINGS)
DERMABOND ADVANCED .7 DNX12 (GAUZE/BANDAGES/DRESSINGS) IMPLANT
DISAL AUG PFC 8MM (Knees) ×2 IMPLANT
DISTAL WEDGE PFC 4MM LEFT (Knees) ×2 IMPLANT
DRAPE EXTREMITY T 121X128X90 (DRAPE) ×2 IMPLANT
DRAPE POUCH INSTRU U-SHP 10X18 (DRAPES) ×2 IMPLANT
DRAPE U-SHAPE 47X51 STRL (DRAPES) ×2 IMPLANT
DRSG ADAPTIC 3X8 NADH LF (GAUZE/BANDAGES/DRESSINGS) IMPLANT
DRSG AQUACEL AG ADV 3.5X10 (GAUZE/BANDAGES/DRESSINGS) IMPLANT
DRSG AQUACEL AG ADV 3.5X14 (GAUZE/BANDAGES/DRESSINGS) ×2 IMPLANT
DRSG PAD ABDOMINAL 8X10 ST (GAUZE/BANDAGES/DRESSINGS) IMPLANT
DRSG TEGADERM 4X4.75 (GAUZE/BANDAGES/DRESSINGS) IMPLANT
DURAPREP 26ML APPLICATOR (WOUND CARE) ×4 IMPLANT
ELECT REM PT RETURN 9FT ADLT (ELECTROSURGICAL) ×2
ELECTRODE REM PT RTRN 9FT ADLT (ELECTROSURGICAL) ×1 IMPLANT
EVACUATOR 1/8 PVC DRAIN (DRAIN) IMPLANT
FACESHIELD WRAPAROUND (MASK) ×12 IMPLANT
FEM TC3 PFC SZ3 LEFT (Orthopedic Implant) ×2 IMPLANT
FEMORAL ADAPTER (Orthopedic Implant) ×2 IMPLANT
FEMORAL TC3 PFC SZ3 LEFT (Orthopedic Implant) ×1 IMPLANT
GAUZE SPONGE 2X2 8PLY STRL LF (GAUZE/BANDAGES/DRESSINGS) IMPLANT
GLOVE BIOGEL PI IND STRL 7.5 (GLOVE) ×1 IMPLANT
GLOVE BIOGEL PI IND STRL 8 (GLOVE) ×1 IMPLANT
GLOVE BIOGEL PI INDICATOR 7.5 (GLOVE) ×1
GLOVE BIOGEL PI INDICATOR 8 (GLOVE) ×1
GLOVE ECLIPSE 8.0 STRL XLNG CF (GLOVE) ×2 IMPLANT
GLOVE ORTHO TXT STRL SZ7.5 (GLOVE) ×26 IMPLANT
GOWN SPEC L3 XXLG W/TWL (GOWN DISPOSABLE) ×4 IMPLANT
GOWN STRL REUS W/TWL LRG LVL3 (GOWN DISPOSABLE) ×6 IMPLANT
HANDPIECE INTERPULSE COAX TIP (DISPOSABLE) ×2
IMMOBILIZER KNEE 20 (SOFTGOODS) IMPLANT
INSERT TC3 TIBIAL 3.0 (Knees) ×2 IMPLANT
KIT BASIN OR (CUSTOM PROCEDURE TRAY) ×2 IMPLANT
MANIFOLD NEPTUNE II (INSTRUMENTS) ×2 IMPLANT
NDL SAFETY ECLIPSE 18X1.5 (NEEDLE) ×1 IMPLANT
NEEDLE HYPO 18GX1.5 SHARP (NEEDLE) ×2
NS IRRIG 1000ML POUR BTL (IV SOLUTION) ×2 IMPLANT
PACK TOTAL JOINT (CUSTOM PROCEDURE TRAY) ×2 IMPLANT
PADDING CAST COTTON 6X4 STRL (CAST SUPPLIES) IMPLANT
PATELLA DOME PFC 38MM (Knees) ×2 IMPLANT
POSITIONER SURGICAL ARM (MISCELLANEOUS) ×2 IMPLANT
POST AVE PFC 4MM (Knees) ×2 IMPLANT
POST AVE PFC 8MM (Knees) ×2 IMPLANT
RESTRICTOR CEMENT SZ 5 C-STEM (Cement) ×2 IMPLANT
SET HNDPC FAN SPRY TIP SCT (DISPOSABLE) ×1 IMPLANT
SET PAD KNEE POSITIONER (MISCELLANEOUS) ×2 IMPLANT
SPONGE GAUZE 2X2 STER 10/PKG (GAUZE/BANDAGES/DRESSINGS)
SPONGE GAUZE 4X4 12PLY (GAUZE/BANDAGES/DRESSINGS) IMPLANT
SPONGE LAP 18X18 X RAY DECT (DISPOSABLE) ×2 IMPLANT
STAPLER VISISTAT 35W (STAPLE) IMPLANT
STEM TIBIA PFC 13X60MM (Stem) ×4 IMPLANT
SUCTION FRAZIER 12FR DISP (SUCTIONS) ×2 IMPLANT
SUT MNCRL AB 3-0 PS2 18 (SUTURE) ×2 IMPLANT
SUT VIC AB 1 CT1 36 (SUTURE) ×6 IMPLANT
SUT VIC AB 2-0 CT1 27 (SUTURE) ×6
SUT VIC AB 2-0 CT1 TAPERPNT 27 (SUTURE) ×3 IMPLANT
SUT VLOC 180 0 24IN GS25 (SUTURE) ×2 IMPLANT
SYR 50ML LL SCALE MARK (SYRINGE) ×2 IMPLANT
TOWEL OR 17X26 10 PK STRL BLUE (TOWEL DISPOSABLE) ×2 IMPLANT
TOWER CARTRIDGE SMART MIX (DISPOSABLE) IMPLANT
TRAY FOLEY CATH 14FRSI W/METER (CATHETERS) ×2 IMPLANT
TRAY REVISION SZ 2.5 (Knees) ×2 IMPLANT
TRAY SLEEVE CEM ML (Knees) ×2 IMPLANT
WATER STERILE IRR 1500ML POUR (IV SOLUTION) ×2 IMPLANT
WEDGE DISTAL PFC LEFT 4MM (Knees) ×1 IMPLANT
WEDGE SZ 2.0MM (Knees) ×4 IMPLANT
WRAP KNEE MAXI GEL POST OP (GAUZE/BANDAGES/DRESSINGS) ×2 IMPLANT

## 2013-12-31 NOTE — Progress Notes (Signed)
IV team notified that pt has preexisting PICC line in R arm and OR CRNA had reported as clotted off.  IV team plans to see pt in floor room.

## 2013-12-31 NOTE — Anesthesia Postprocedure Evaluation (Signed)
  Anesthesia Post-op Note  Patient: [name]  Procedure(s) Performed: Procedure(s) (LRB): RE-INPLANTATION LEFT TOTAL KNEE  (Left)  Patient Location: PACU  Anesthesia Type: General  Level of Consciousness: awake and alert   Airway and Oxygen Therapy: Patient Spontanous Breathing  Post-op Pain: mild  Post-op Assessment: Post-op Vital signs reviewed, Patient's Cardiovascular Status Stable, Respiratory Function Stable, Patent Airway and No signs of Nausea or vomiting  Last Vitals:  Filed Vitals:   12/31/13 1930  BP: 110/47  Pulse: 77  Temp: 36.5 C  Resp: 18    Post-op Vital Signs: stable   Complications: No apparent anesthesia complications

## 2013-12-31 NOTE — Interval H&P Note (Signed)
History and Physical Interval Note:  12/31/2013 12:29 PM  [name]  has presented today for surgery, with the diagnosis of STATUS POST RESECTION INFECTED LEFT TOTAL KNEE  The various methods of treatment have been discussed with the patient and family. After consideration of risks, benefits and other options for treatment, the patient has consented to  Procedure(s): RE-INPLANTATION LEFT TOTAL KNEE  (Left) versus repeat Excisional debridement placement of antibiotic spacer as a surgical intervention .  The patient's history has been reviewed, patient examined, no change in status, stable for surgery.  I have reviewed the patient's chart and labs.  Questions were answered to the patient's satisfaction.     Mauri Pole

## 2013-12-31 NOTE — Anesthesia Preprocedure Evaluation (Addendum)
Anesthesia Evaluation  Patient identified by MRN, date of birth, ID band Patient awake    Reviewed: Allergy & Precautions, H&P , NPO status , Patient's Chart, lab work & pertinent test results  History of Anesthesia Complications Negative for: history of anesthetic complications  Airway Mallampati: II TM Distance: >3 FB Neck ROM: Full    Dental no notable dental hx. (+) Edentulous Upper, Edentulous Lower   Pulmonary sleep apnea , former smoker,  breath sounds clear to auscultation  Pulmonary exam normal       Cardiovascular hypertension, + CAD, + Past MI, + Cardiac Stents (2013) and + Peripheral Vascular Disease + dysrhythmias Atrial Fibrillation Rhythm:Regular Rate:Normal     Neuro/Psych negative neurological ROS  negative psych ROS   GI/Hepatic Neg liver ROS, hiatal hernia,   Endo/Other  diabetes, Insulin DependentMorbid obesity  Renal/GU negative Renal ROS  negative genitourinary   Musculoskeletal negative musculoskeletal ROS (+)   Abdominal   Peds negative pediatric ROS (+)  Hematology negative hematology ROS (+)   Anesthesia Other Findings   Reproductive/Obstetrics negative OB ROS                        Anesthesia Physical Anesthesia Plan  ASA: III  Anesthesia Plan: General   Post-op Pain Management:    Induction: Intravenous  Airway Management Planned: Oral ETT  Additional Equipment:   Intra-op Plan:   Post-operative Plan: Extubation in OR  Informed Consent: I have reviewed the patients History and Physical, chart, labs and discussed the procedure including the risks, benefits and alternatives for the proposed anesthesia with the patient or authorized representative who has indicated his/her understanding and acceptance.   Dental advisory given  Plan Discussed with: CRNA  Anesthesia Plan Comments:         Anesthesia Quick Evaluation                                   Anesthesia Evaluation  Patient identified by MRN, date of birth, ID band Patient awake    Reviewed: Allergy & Precautions, H&P , NPO status , Patient's Chart, lab work & pertinent test results, reviewed documented beta blocker date and time   History of Anesthesia Complications (+) MALIGNANT HYPERTHERMIA  Airway Mallampati: III TM Distance: >3 FB Neck ROM: full    Dental no notable dental hx. (+) Teeth Intact and Dental Advisory Given   Pulmonary sleep apnea , former smoker,  Lung Cancer LUL lobectomy.  Mild sleep apnea breath sounds clear to auscultation  Pulmonary exam normal       Cardiovascular hypertension, Pt. on home beta blockers + CAD, + Past MI and + Peripheral Vascular Disease + dysrhythmias Atrial Fibrillation Rhythm:regular Rate:Normal  Palpitations. EF 55% echo 10/13.  Low risk myoview 3/14.  NSTEMI 7/13   Neuro/Psych negative neurological ROS  negative psych ROS   GI/Hepatic negative GI ROS, Neg liver ROS,   Endo/Other  diabetes, Well Controlled, Type 2, Insulin DependentMorbid obesity  Renal/GU negative Renal ROS  negative genitourinary   Musculoskeletal   Abdominal (+) + obese,   Peds  Hematology negative hematology ROS (+)   Anesthesia Other Findings   Reproductive/Obstetrics negative OB ROS                         Anesthesia Physical Anesthesia Plan  ASA: III  Anesthesia Plan: General   Post-op Pain  Management:    Induction: Intravenous  Airway Management Planned: Oral ETT  Additional Equipment:   Intra-op Plan:   Post-operative Plan: Extubation in OR  Informed Consent: I have reviewed the patients History and Physical, chart, labs and discussed the procedure including the risks, benefits and alternatives for the proposed anesthesia with the patient or authorized representative who has indicated his/her understanding and acceptance.   Dental Advisory Given  Plan Discussed with: CRNA and  Surgeon  Anesthesia Plan Comments:         Anesthesia Quick Evaluation

## 2013-12-31 NOTE — Transfer of Care (Signed)
Immediate Anesthesia Transfer of Care Note  Patient: [name]  Procedure(s) Performed: Procedure(s): RE-INPLANTATION LEFT TOTAL KNEE  (Left)  Patient Location: PACU  Anesthesia Type:General  Level of Consciousness: awake, alert , oriented and patient cooperative  Airway & Oxygen Therapy: Patient Spontanous Breathing and Patient connected to face mask oxygen  Post-op Assessment: Report given to PACU RN, Post -op Vital signs reviewed and stable and Patient moving all extremities X 4  Post vital signs: stable  Complications: No apparent anesthesia complications

## 2013-12-31 NOTE — Progress Notes (Signed)
pts measured legs are larger than the largest size of TED'S we have so none are placed

## 2013-12-31 NOTE — Progress Notes (Signed)
ANTICOAGULATION CONSULT NOTE - Initial Consult  Pharmacy Consult for Warfarin  Indication Hx A.Fib, VTE px s/p L TKR  Allergies  Allergen Reactions  . Levofloxacin Itching  . Morphine And Related Itching    Patient Measurements:   Vital Signs: Temp: 98.3 F (36.8 C) (04/06 1835) Temp src: Oral (04/06 1051) BP: 120/41 mmHg (04/06 1835) Pulse Rate: 71 (04/06 1835)  Labs:  Recent Labs  12/31/13 1155  LABPROT 13.4  INR 1.04    The CrCl is unknown because both a height and weight (above a minimum accepted value) are required for this calculation.   Medical History: Past Medical History  Diagnosis Date  . Hypertension   . Hypercholesteremia   . Cancer 5/10    LUL Vats   . Lung cancer   . Peripheral vascular disease 8/12    Lt SFA PTA  . Sleep apnea     " mild form ,does not use cpap "  . Diabetes mellitus     insulin dependent  . GERD (gastroesophageal reflux disease)   . H/O hiatal hernia   . Arthritis   . Non-STEMI (non-ST elevated myocardial infarction) 04/07/2012    See cath results below  . Presence of stent in LAD coronary artery 04/07/2012    Xience Expedition DES 2.75 mm x 18 mm (dilated to 3.0 mm)  . Paroxysmal atrial fibrillation 04/07/2012    On Warfarin  . Heart palpitations     monitor 04/19/12-05/10/12- frequent PAF with RVR- coumadin started    Medications:  Scheduled:  . [START ON 01/01/2014] aspirin  81 mg Oral Daily  .  ceFAZolin (ANCEF) IV  2 g Intravenous Q6H  . celecoxib  200 mg Oral Q12H  . docusate sodium  100 mg Oral BID  . [START ON 01/01/2014] enoxaparin (LOVENOX) injection  40 mg Subcutaneous Q12H  . fentaNYL      . [START ON 01/01/2014] ferrous sulfate  325 mg Oral TID PC  . HYDROcodone-acetaminophen  1-2 tablet Oral Q4H  . insulin aspart      . [START ON 01/01/2014] insulin aspart  0-15 Units Subcutaneous TID WC  . [START ON 01/01/2014] insulin aspart protamine- aspart  25 Units Subcutaneous Q supper  . [START ON 01/01/2014] insulin  aspart protamine- aspart  33 Units Subcutaneous Q breakfast  . metoprolol  50 mg Oral BID  . pantoprazole  80 mg Oral Daily  . polyethylene glycol  17 g Oral BID  . simvastatin  40 mg Oral QPM   Infusions:  . sodium chloride 0.9 % 1,000 mL with potassium chloride 10 mEq infusion     PRN: albuterol, alum & mag hydroxide-simeth, bisacodyl, diphenhydrAMINE, HYDROmorphone (DILAUDID) injection, magnesium citrate, menthol-cetylpyridinium, methocarbamol (ROBAXIN) IV, methocarbamol, metoCLOPramide (REGLAN) injection, metoCLOPramide, ondansetron (ZOFRAN) IV, ondansetron, phenol  Assessment:  63 yo F on chronic anticoagulation with warfarin for A.Fib and VTE px s/p re-inplantation of left total knee on 4/6.    Home warfarin dose = 12.5 mg daily, last dose on 3/31   INR today is subtherapeutic as expected after holding warfarin x 6 days   Pre-op CBC okay   Lovenox 40 mg BID starting 4/7 until INR >/= 1.8   Goal of Therapy:  INR 2-3 Monitor platelets by anticoagulation protocol: Yes   Plan:  1.) warfarin 12.5 mg po x 1 tonight at 1800 2.) Daily PT/INR 3.) Lovenox 40 mg BID until INR >/= 1.8 per MD, pharmacy will f/u 4.) Watch drug interaction with warfarin//aspirin/celebrex - can increase  risk of bleeding   Robey Massmann, Gaye Alken PharmD Pager #: [phone] 7:26 PM 12/31/2013

## 2013-12-31 NOTE — Brief Op Note (Signed)
12/31/2013  4:38 PM  PATIENT:  [name]  63 y.o. female  PRE-OPERATIVE DIAGNOSIS:  STATUS POST RESECTION of an INFECTED LEFT TOTAL KNEE replacement  POST-OPERATIVE DIAGNOSIS:   STATUS POST RESECTION of an INFECTED LEFT TOTAL KNEE replacement  PROCEDURE:  Procedure(s): RE-INPLANTATION LEFT TOTAL KNEE  (Left) Depuy  SURGEON:  Surgeon(s) and Role:    * Mauri Pole, MD - Primary  PHYSICIAN ASSISTANT: Danae Orleans, PA-C  ANESTHESIA:   general  EBL:  Total I/O In: 1000 [I.V.:1000] Out: 1100 [Urine:300; Blood:800]  BLOOD ADMINISTERED:none  DRAINS: none   LOCAL MEDICATIONS USED:  Exparel, marcaine combination  SPECIMEN:  No Specimen  DISPOSITION OF SPECIMEN:  N/A  COUNTS:  YES  TOURNIQUET:   Total Tourniquet Time Documented: Thigh (Left) - 7 minutes Thigh (Left) - -20 minutes Total: Thigh (Left) - -13 minutes   DICTATION: .Other Dictation: Dictation Number [id number]  PLAN OF CARE: Admit to inpatient   PATIENT DISPOSITION:  PACU - hemodynamically stable.   Delay start of Pharmacological VTE agent (>24hrs) due to surgical blood loss or risk of bleeding: no

## 2014-01-01 ENCOUNTER — Inpatient Hospital Stay (HOSPITAL_COMMUNITY): Payer: Medicare Other

## 2014-01-01 DIAGNOSIS — R918 Other nonspecific abnormal finding of lung field: Secondary | ICD-10-CM | POA: Diagnosis not present

## 2014-01-01 DIAGNOSIS — Z452 Encounter for adjustment and management of vascular access device: Secondary | ICD-10-CM | POA: Diagnosis not present

## 2014-01-01 LAB — GLUCOSE, CAPILLARY
Glucose-Capillary: 196 mg/dL — ABNORMAL HIGH (ref 70–99)
Glucose-Capillary: 220 mg/dL — ABNORMAL HIGH (ref 70–99)
Glucose-Capillary: 258 mg/dL — ABNORMAL HIGH (ref 70–99)
Glucose-Capillary: 128 mg/dL — ABNORMAL HIGH (ref 70–99)
Glucose-Capillary: 92 mg/dL (ref 70–99)
Glucose-Capillary: 167 mg/dL — ABNORMAL HIGH (ref 70–99)

## 2014-01-01 LAB — BASIC METABOLIC PANEL
BUN: 14 mg/dL (ref 6–23)
CO2: 26 meq/L (ref 19–32)
Calcium: 9.5 mg/dL (ref 8.4–10.5)
Chloride: 97 mEq/L (ref 96–112)
Creatinine, Ser: 0.96 mg/dL (ref 0.50–1.10)
GFR calc Af Amer: 71 mL/min — ABNORMAL LOW (ref >90)
GFR, EST NON AFRICAN AMERICAN: 61 mL/min — AB (ref >90)
GLUCOSE: 261 mg/dL — AB (ref 70–99)
POTASSIUM: 4.9 meq/L (ref 3.7–5.3)
Sodium: 137 mEq/L (ref 137–147)

## 2014-01-01 LAB — CBC
HEMATOCRIT: 30.6 % — AB (ref 36.0–46.0)
HEMOGLOBIN: 10.2 g/dL — AB (ref 12.0–15.0)
MCH: 28.9 pg (ref 26.0–34.0)
MCHC: 33.3 g/dL (ref 30.0–36.0)
MCV: 86.7 fL (ref 78.0–100.0)
Platelets: 201 10*3/uL (ref 150–400)
RBC: 3.53 MIL/uL — ABNORMAL LOW (ref 3.87–5.11)
RDW: 13.3 % (ref 11.5–15.5)
WBC: 8.8 10*3/uL (ref 4.0–10.5)

## 2014-01-01 LAB — PROTIME-INR
INR: 1.14 (ref 0.00–1.49)
Prothrombin Time: 14.4 seconds (ref 11.6–15.2)

## 2014-01-01 MED ORDER — SODIUM CHLORIDE 0.9 % IJ SOLN: 10.0000 mL | Freq: Two times a day (BID) | INTRAMUSCULAR | Status: DC

## 2014-01-01 MED ORDER — WARFARIN SODIUM 2.5 MG PO TABS
12.5000 mg | ORAL_TABLET | Freq: Once | ORAL | Status: AC
  Administered 2014-01-01: 12.5 mg via ORAL
  Filled 2014-01-01: qty 1

## 2014-01-01 MED ORDER — PROMETHAZINE HCL 25 MG/ML IJ SOLN
12.5000 mg | Freq: Four times a day (QID) | INTRAMUSCULAR | Status: DC | PRN
  Administered 2014-01-01: 25 mg via INTRAVENOUS
  Filled 2014-01-01: qty 1

## 2014-01-01 MED ORDER — SODIUM CHLORIDE 0.9 % IJ SOLN
10.0000 mL | INTRAMUSCULAR | Status: DC | PRN
  Administered 2014-01-01: 10 mL

## 2014-01-01 MED ORDER — ALTEPLASE 2 MG IJ SOLR
2.0000 mg | Freq: Once | INTRAMUSCULAR | Status: AC
  Administered 2014-01-01: 2 mg
  Filled 2014-01-01: qty 2

## 2014-01-01 NOTE — Progress Notes (Signed)
ANTICOAGULATION CONSULT NOTE - Follow up  Pharmacy Consult for Warfarin  Indication Hx A.Fib, VTE px s/p L TKR  Allergies  Allergen Reactions  . Levofloxacin Itching  . Morphine And Related Itching   Patient Measurements: TBW 131.5 kg  Labs:  Recent Labs  12/31/13 1155 01/01/14 0820  HGB  --  10.2*  HCT  --  30.6*  PLT  --  201  LABPROT 13.4 14.4  INR 1.04 1.14  CREATININE  --  0.96   Medications:  Scheduled:  . aspirin  81 mg Oral Daily  . docusate sodium  100 mg Oral BID  . enoxaparin (LOVENOX) injection  40 mg Subcutaneous Q12H  . ferrous sulfate  325 mg Oral TID PC  . HYDROcodone-acetaminophen  1-2 tablet Oral Q4H  . insulin aspart  0-15 Units Subcutaneous TID WC  . insulin aspart protamine- aspart  25 Units Subcutaneous Q supper  . insulin aspart protamine- aspart  33 Units Subcutaneous Q breakfast  . metoprolol  50 mg Oral BID  . pantoprazole  80 mg Oral Daily  . polyethylene glycol  17 g Oral BID  . simvastatin  40 mg Oral QPM  . warfarin  12.5 mg Oral ONCE-1800  . Warfarin - Pharmacist Dosing Inpatient   Does not apply q1800   Assessment:  63 yo F on chronic anticoagulation with warfarin for A.Fib and VTE px s/p re-inplantation of left total knee on 4/6. Home warfarin dose = 12.5 mg daily, last dose on 3/31   INR today 1.14 after 12.5mg  Warfarin  Lovenox 40 mg BID starting 4/7 until INR >/= 1.8   Goal of Therapy:  INR 2-3 Monitor platelets by anticoagulation protocol: Yes   Plan:  1.) warfarin 12.5 mg po x 1 tonight at 1800 2.) Daily PT/INR 3.) Lovenox 40 mg BID until INR >/= 1.8 per MD, pharmacy will f/u 4.) Watch drug interaction with warfarin//aspirin/celebrex - can increase risk of bleeding   Minda Ditto PharmD Pager [phone] 01/01/2014, 11:53 AM

## 2014-01-01 NOTE — Progress Notes (Signed)
Utilization review completed.  

## 2014-01-01 NOTE — Evaluation (Signed)
Physical Therapy Evaluation Patient Details Name: [name] MRN: [id number] DOB: 1951-01-26 Today's Date: 01/01/2014   History of Present Illness  63 yo female s/p L knee resection and spacer 11/05/13, admitted for reimplantation.  Clinical Impression  Pt had difficulty AM with nausea. Did ambulate this PM  X 25'. Pt plans to return to SNF. Pt will bebefit from PT to address problems listed.    Follow Up Recommendations SNF    Equipment Recommendations  None recommended by PT    Recommendations for Other Services       Precautions / Restrictions Precautions Precautions: Fall;Knee Required Braces or Orthoses: Knee Immobilizer - Left Knee Immobilizer - Left: On when out of bed or walking      Mobility  Bed Mobility Overal bed mobility: Needs Assistance;+ 2 for safety/equipment Bed Mobility: Supine to Sit     Supine to sit: Mod assist;+2 for safety/equipment;HOB elevated     General bed mobility comments: extra time to move to edge, use of rail  Transfers Overall transfer level: Needs assistance Equipment used: Rolling walker (2 wheeled) Transfers: Sit to/from Stand Sit to Stand: +2 safety/equipment;+2 physical assistance;Mod assist;From elevated surface         General transfer comment: cues for hand placement,  Ambulation/Gait Ambulation/Gait assistance: +2 safety/equipment;Min assist Ambulation Distance (Feet): 25 Feet Assistive device: Rolling walker (2 wheeled) Gait Pattern/deviations: Step-to pattern;Antalgic;Decreased step length - left;Decreased stance time - left     General Gait Details: extra time to work through pain and nausea  Stairs            Wheelchair Mobility    Modified Rankin (Stroke Patients Only)       Balance                                             Pertinent Vitals/Pain Pain <5 ice applied.    Home Living Family/patient expects to be discharged to:: Skilled nursing facility                      Prior Function Level of Independence: Needs assistance   Gait / Transfers Assistance Needed: was TDWB PTA.           Hand Dominance        Extremity/Trunk Assessment   Upper Extremity Assessment: Overall WFL for tasks assessed           Lower Extremity Assessment: LLE deficits/detail   LLE Deficits / Details: assistance to lift Leg from bed, assist to edge, extra time to work through nausea and pain.     Communication      Cognition Arousal/Alertness: Awake/alert Behavior During Therapy: WFL for tasks assessed/performed Overall Cognitive Status: Within Functional Limits for tasks assessed                      General Comments      Exercises Total Joint Exercises Quad Sets: AAROM;Both;10 reps;Supine      Assessment/Plan    PT Assessment Patient needs continued PT services  PT Diagnosis Difficulty walking;Acute pain   PT Problem List Decreased strength;Decreased range of motion;Decreased activity tolerance;Decreased mobility;Decreased knowledge of precautions;Decreased safety awareness;Decreased knowledge of use of DME  PT Treatment Interventions DME instruction;Gait training;Functional mobility training;Therapeutic activities;Therapeutic exercise;Patient/family education   PT Goals (Current goals can be found in the Care Plan section) Acute Rehab  PT Goals Patient Stated Goal: I want to get this over. PT Goal Formulation: With patient Time For Goal Achievement: 01/08/14 Potential to Achieve Goals: Good    Frequency 7X/week   Barriers to discharge        Co-evaluation               End of Session Equipment Utilized During Treatment: Gait belt Activity Tolerance: Patient limited by fatigue;Treatment limited secondary to medical complications (Comment) (dizziness)             Time: 1401-1440 PT Time Calculation (min): 39 min   Charges:   PT Evaluation $Initial PT Evaluation Tier I: 1 Procedure PT Treatments $Gait  Training: 23-37 mins $Therapeutic Exercise: 8-22 mins   PT G Codes:          Claretha Cooper 01/01/2014, 3:24 PM Tresa Endo PT [phone]

## 2014-01-01 NOTE — Op Note (Signed)
NAMENIESHIA, LARMON NO.:  [id number]  MEDICAL RECORD NO.:  [id number]  LOCATION:  8416                         FACILITY:  [facility]  PHYSICIAN:  Pietro Cassis. Alvan Dame, M.D.  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  PROCEDURE:  Reimplantation/revision left total knee arthroplasty. Components used DePuy TC3 rotating platform system with a size 3 TC3 femur with +2 bolt 5 degree adapter with a 13 x 60 cemented stem, size 8 mm posterior medial augment, size 4 mm posterolateral augment, size 8 mm distal lateral augment, and size 4 mm distal medial augment.  Tibia side was a size 2.5 MBT revision tray with a 29 cemented sleeve, a 10 mm medial and lateral augments and a 13 x 60 cemented stem.  This insert was a size 12.5, TC3 insert and a 38 patellar button utilized.  SURGEON:  Pietro Cassis. Alvan Dame, M.D.  ASSISTANT:  Danae Orleans, PA-C.  Note that Mr. [name] was present for the entirety of the case from perioperative positioning, perioperative management, operative extremity general facilitation, and primary wound closure.  ANESTHESIA:  General.  SPECIMENS:  None.  FINDINGS:  The patient has clear synovial fluid.  No signs of infection. She had an extremely limited range of motion preresection of her 1st knee as well as now there was significant bone loss both from the tibia and the femoral side from her previous surgeries, but no obvious sign of infection.  Thus, we proceed with the planned procedure of reimplantation.  DRAINS:  None.  ESTIMATED BLOOD LOSS:  Probably 200 mL.  TOURNIQUET TIME:  105 minutes; 350 mmHg.  COMPLICATION:  None apparent.  INDICATION OF PROCEDURE:  Ms. Das is a  63 year old female with an index of left total knee replacement had failed and subsequent revision surgery.  She presented to the office for evaluation of persistently painful knee with revision components in place.  She was identified to have infectious process.  The components were removed.  She was treated with 8 weeks of antibiotics and planned reimplantation.  She was followed by infectious disease.  Risks of recurrent infection and potential issues with fusion versus amputation were all discussed and reviewed in the setting of reimplantation.  Consent was obtained for benefit of pain relief and improved function.  PROCEDURE IN DETAIL:  The patient was brought to the operative theater. Once adequate anesthesia, preoperative antibiotics, Ancef, and vancomycin was administered; she was positioned supine with a left thigh tourniquet placed.  Left lower extremity was then prepped and draped in sterile fashion.  The left leg placed in De[employer]o leg holder.  Time-out was performed identifying the patient, planned procedure, and extremity.  The patient's old midline incision was excised and soft tissue planes created.  Median arthrotomy was made encountering clear synovial fluid. The decision was then made to proceed with total knee reimplantation.  Following initial exposure including further scar debridement and debridement including proximal medial peel as well as banana peel  type exposure laterally, the knee was exposed.  The cement and spacer was removed both on the femoral tibial side without complication.  Once this was carried out, I reamed the femoral and tibial canals up to 15 mm in diameter.  I then used a curette to remove remaining fibrous tissue within the canals.  The canals were both irrigated with pulse lavage and normal saline solution 500 mL/h.  Canal.  At this point, attention was directed 1st to  the tibia side.  The extramedullary guide was positioned with 2 degrees  of posterior slope and parallel to the tibial shaft.  Only minimal amount of bone was resected to maintain bone.  There was significant bone loss from previous procedures that she has had as well as removal of the cemented infected components.  We made decision to augment my initial trial.  I placed a size 3 tibial tray in place, with a 29 cemented sleeve, a 5 mm augment onto a broached and prepared proximal tibia.  Based on the alignment rod, the component appeared to be perpendicular in the coronal plane.  Once this was in place, I attended to the femoral side.  I placed an intramedullary rod into the canal and then evaluated the distal cut removing only minimal amount of bone distal medially.  I decided I would use at least a 4-mm asymmetric difference on the lateral distal femur compared to the medial side.  At this point, after revisiting all necessary cuts and again noting significant bone loss in the distal femur, we selected a size 3 femoral component with a 4-mm distal lateral augment, 8 mm posteromedial and 4 mm posterolateral. With this in place and 60 mm cemented stem, we did a trial reduction. We identified and still needed some help with extension and minimal flexion gap appeared to be relatively secure.  For the reasons noted to my trial reductions, final components were selected.  At this point, all trial components were removed from the knee.  Final cement restrictor were placed in the distal femur, proximal tibia, and final preparation of bone was carried out with a repeat irrigation and debridement.  The final components were opened as noted above.  The tibial component and the femoral component were configured on the back table under my direct supervision.  Please note that while the trial femoral and tibial components were placed, the attention was directed to the patella and resected minimal amount of bone and I cleaned up the cut surfaces of the patella,  used a 38 patellar button to cover the cut surface.  Lug holes were drilled for this.  Once the tibial and femoral components were prepared, the 4 batches of cement, gentamicin impregnated cement were mixed and prepared.  The final components were then cemented into place and the knee was brought to extension with a 12.5 insert.  Extruded cement was removed.  The knee was held in extension until the cement fully cured.  The base of the fixation at this point can be predominantly based on cement fixation distal on the femur and proximal tibia.  Once the cement had fully cured, excess cement was removed from that. The knee we selected as 12.5 TC3 insert and meshed with 3 femur, which was then placed into the tibial tray.  The knee was reduced.  At this point, the extensor mechanism was reapproximated using a combination of #1 Vicryl interrupted sutures.  Then a 0 V-Loc suture. The remaining wound was closed with 2-0 Vicryl and  running 3-0 Monocryl. The knee was then cleaned dried and dressed sterilely using Dermabond and Aquacel dressing.  She was then brought to the recovery room in stable condition tolerating the procedure well and extubated.     Pietro Cassis Alvan Dame, M.D.     MDO/MEDQ  D:  12/31/2013  T:  01/01/2014  Job:  [id number]

## 2014-01-01 NOTE — Progress Notes (Signed)
   Subjective: 1 Day Post-Op Procedure(s) (LRB): RE-INPLANTATION LEFT TOTAL KNEE  (Left)   Patient reports pain as mild, pain controlled. Complaining of some nausea this morning.   Objective:   VITALS:   Filed Vitals:   01/01/14 0639  BP: 136/56  Pulse: 102  Temp: 99.6 F (37.6 C)  Resp: 20    Neurovascular intact Dorsiflexion/Plantar flexion intact Incision: dressing C/D/I No cellulitis present Compartment soft  LABS No labs drawn as of yet, difficulty pulling from PICC line.  Will have the PICC line checked and change if needed / draw labs.   Assessment/Plan: 1 Day Post-Op Procedure(s) (LRB): RE-INPLANTATION LEFT TOTAL KNEE  (Left) Added phenergan order for nausea Foley cath d/c'ed Will have ID see the patient while in the hospital. Dr. Alvan Dame would like to continue the patient on antibiotics for another 4-6 weeks. Advance diet Up with therapy D/C IV fluids Discharge to SNF eventually, when ready        West Pugh. Nariyah Osias   PAC  01/01/2014, 7:40 AM

## 2014-01-01 NOTE — Progress Notes (Signed)
Clinical Social Work Department BRIEF PSYCHOSOCIAL ASSESSMENT 01/01/2014  Patient:  [name], [name]     Account Number:  [id number]     Admit date:  12/31/2013  Clinical Social Worker:  Lacie Scotts  Date/Time:  01/01/2014 01:26 PM  Referred by:  RN  Date Referred:  01/01/2014 Referred for  SNF Placement   Other Referral:   Interview type:  Patient Other interview type:    PSYCHOSOCIAL DATA Living Status:  FACILITY Admitted from facility:  [facility] Level of care:  [place] Primary support name:  Lailani Tool Primary support relationship to patient:  CHILD, ADULT Degree of support available:   unclear    CURRENT CONCERNS Current Concerns  Post-Acute Placement   Other Concerns:    SOCIAL WORK ASSESSMENT / PLAN Pt is a 63 yr old female admitted from [facility] for a planned surgery. CSW met with pt to assist with d/c planning. CSW had assisted pt with her placement at [facility] during her last hospitalization. Pt states she plans to return to [facility] following hospital d/c. CSW has contacted SNF and d/c plan has been confirmed. Pt has medicare only. CSW has confirmed with SNF that a medicaid application has been initiated.   Assessment/plan status:  Psychosocial Support/Ongoing Assessment of Needs Other assessment/ plan:   Information/referral to community resources:   None needed at this time.    PATIENT'S/FAMILY'S RESPONSE TO PLAN OF CARE: Pt was feeling poorly this am. Her pain is controlled but she is experiencing some nausea. Pt plans to return to [facility] and complete her rehab at d/c.   Werner Lean LCSW [phone]

## 2014-01-01 NOTE — Progress Notes (Signed)
OT Cancellation Note  Patient Details Name: [name] MRN: [id number] DOB: 01-Oct-1950   Cancelled Treatment:    Reason Eval/Treat Not Completed: Other (comment).  Pt plans snf for rehab and OT eval is not needed for admission.  Will defer OT to that venue.  [name] 01/01/2014, 8:37 AM Lesle Chris, OTR/L [phone] 01/01/2014

## 2014-01-02 ENCOUNTER — Encounter (HOSPITAL_COMMUNITY): Payer: Self-pay | Admitting: Orthopedic Surgery

## 2014-01-02 DIAGNOSIS — Y831 Surgical operation with implant of artificial internal device as the cause of abnormal reaction of the patient, or of later complication, without mention of misadventure at the time of the procedure: Secondary | ICD-10-CM

## 2014-01-02 DIAGNOSIS — Z96659 Presence of unspecified artificial knee joint: Secondary | ICD-10-CM | POA: Diagnosis not present

## 2014-01-02 DIAGNOSIS — T8450XA Infection and inflammatory reaction due to unspecified internal joint prosthesis, initial encounter: Secondary | ICD-10-CM

## 2014-01-02 LAB — PROTIME-INR
INR: 1.14 (ref 0.00–1.49)
Prothrombin Time: 14.4 seconds (ref 11.6–15.2)

## 2014-01-02 LAB — C-REACTIVE PROTEIN: CRP: 12.5 mg/dL — ABNORMAL HIGH (ref <0.60)

## 2014-01-02 LAB — GLUCOSE, CAPILLARY
GLUCOSE-CAPILLARY: 150 mg/dL — AB (ref 70–99)
GLUCOSE-CAPILLARY: 122 mg/dL — AB (ref 70–99)
Glucose-Capillary: 152 mg/dL — ABNORMAL HIGH (ref 70–99)
Glucose-Capillary: 123 mg/dL — ABNORMAL HIGH (ref 70–99)

## 2014-01-02 LAB — BASIC METABOLIC PANEL
BUN: 14 mg/dL (ref 6–23)
CO2: 26 mEq/L (ref 19–32)
Calcium: 9.1 mg/dL (ref 8.4–10.5)
Chloride: 103 mEq/L (ref 96–112)
Creatinine, Ser: 0.85 mg/dL (ref 0.50–1.10)
GFR calc non Af Amer: 71 mL/min — ABNORMAL LOW (ref >90)
GFR, EST AFRICAN AMERICAN: 82 mL/min — AB (ref >90)
Glucose, Bld: 165 mg/dL — ABNORMAL HIGH (ref 70–99)
POTASSIUM: 4.3 meq/L (ref 3.7–5.3)
Sodium: 137 mEq/L (ref 137–147)

## 2014-01-02 LAB — CBC
HCT: 23.9 % — ABNORMAL LOW (ref 36.0–46.0)
Hemoglobin: 7.8 g/dL — ABNORMAL LOW (ref 12.0–15.0)
MCH: 28.9 pg (ref 26.0–34.0)
MCHC: 32.6 g/dL (ref 30.0–36.0)
MCV: 88.5 fL (ref 78.0–100.0)
PLATELETS: 168 10*3/uL (ref 150–400)
RBC: 2.7 MIL/uL — ABNORMAL LOW (ref 3.87–5.11)
RDW: 13.6 % (ref 11.5–15.5)
WBC: 7.6 10*3/uL (ref 4.0–10.5)

## 2014-01-02 LAB — SEDIMENTATION RATE: Sed Rate: 98 mm/hr — ABNORMAL HIGH (ref 0–22)

## 2014-01-02 MED ORDER — SODIUM CHLORIDE 0.9 % IV SOLN
2000.0000 mg | Freq: Once | INTRAVENOUS | Status: AC
  Administered 2014-01-02: 2000 mg via INTRAVENOUS
  Filled 2014-01-02: qty 2000

## 2014-01-02 MED ORDER — WARFARIN SODIUM 7.5 MG PO TABS
15.0000 mg | ORAL_TABLET | Freq: Once | ORAL | Status: AC
  Administered 2014-01-02: 15 mg via ORAL
  Filled 2014-01-02 (×2): qty 2

## 2014-01-02 MED ORDER — SODIUM CHLORIDE 0.9 % IV SOLN
1750.0000 mg | INTRAVENOUS | Status: DC
  Administered 2014-01-03: 1750 mg via INTRAVENOUS
  Filled 2014-01-02: qty 1750

## 2014-01-02 MED ORDER — OXYCODONE HCL 5 MG PO TABS
5.0000 mg | ORAL_TABLET | ORAL | Status: DC
  Administered 2014-01-02 – 2014-01-03 (×6): 5 mg via ORAL
  Filled 2014-01-02 (×2): qty 1
  Filled 2014-01-02: qty 2
  Filled 2014-01-02: qty 1
  Filled 2014-01-02: qty 2
  Filled 2014-01-02: qty 1

## 2014-01-02 MED ORDER — CEFTRIAXONE SODIUM 2 G IJ SOLR
2.0000 g | INTRAMUSCULAR | Status: DC
  Administered 2014-01-02 – 2014-01-03 (×2): 2 g via INTRAVENOUS
  Filled 2014-01-02 (×2): qty 2

## 2014-01-02 NOTE — Progress Notes (Signed)
[facility]  Patient Status: Impatient  [facility] is providing the following services: patient has both rw and commode at home.   If patient discharges after hours, please call [phone].   Linward Headland 01/02/2014, 10:55 AM

## 2014-01-02 NOTE — Progress Notes (Signed)
ANTICOAGULATION CONSULT NOTE - Follow up  Pharmacy Consult for Warfarin  Indication Hx A.Fib: VTE prophylaxis s/p reimplantation L TKR  Allergies  Allergen Reactions  . Levofloxacin Itching  . Morphine And Related Itching   Patient Measurements: TBW 131.5 kg  Labs:  Recent Labs  12/31/13 1155 01/01/14 0820 01/02/14 0450  HGB  --  10.2* 7.8*  HCT  --  30.6* 23.9*  PLT  --  201 168  LABPROT 13.4 14.4 14.4  INR 1.04 1.14 1.14  CREATININE  --  0.96 0.85   Medications:  Scheduled:  . aspirin  81 mg Oral Daily  . docusate sodium  100 mg Oral BID  . enoxaparin (LOVENOX) injection  40 mg Subcutaneous Q12H  . ferrous sulfate  325 mg Oral TID PC  . HYDROcodone-acetaminophen  1-2 tablet Oral Q4H  . insulin aspart  0-15 Units Subcutaneous TID WC  . insulin aspart protamine- aspart  25 Units Subcutaneous Q supper  . insulin aspart protamine- aspart  33 Units Subcutaneous Q breakfast  . metoprolol  50 mg Oral BID  . pantoprazole  80 mg Oral Daily  . polyethylene glycol  17 g Oral BID  . simvastatin  40 mg Oral QPM  . sodium chloride  10-40 mL Intracatheter Q12H  . Warfarin - Pharmacist Dosing Inpatient   Does not apply q1800   Assessment:  63 yo F on chronic anticoagulation with warfarin for A.Fib now s/p re-inplantation of left total knee arthroplasty on 4/6. Warfarin dosage PTA = 12.5 mg daily; last dose was on 3/31.  4/8: POD#2 Inpatient warfarin doses administered: 4/6: -- 4/7: 12.5mg   INR 1.14 (unchanged) Hgb 7.8; no overt bleeding reported Pltc WNL SCr WNL, estimated CrCl ~ 90 mL/min Concomitant ASA noted (h/o NSTEMI) Concomitant celecoxib noted (component of multimodal analgesic regimen)  Goal of Therapy:  INR 2-3 Monitor platelets by anticoagulation protocol: Yes  Plan: 1. Boost with warfarin 15 mg PO x 1 today at 1800 2. Continue Lovenox as ordered by ortho (40 mg SQ BID). 3. PT/INR daily while inpatient.   Clayburn Pert, PharmD, BCPS Pager:  [phone] 01/02/2014  8:41 AM

## 2014-01-02 NOTE — Consult Note (Signed)
[facility]  Total days of antibiotics 2        Day 2 vanco               Reason for Consult: hx of prosthetic joint infection s/p new implant    Referring Physician: olin  Principal Problem:   S/P left TK revision    HPI: [name] is a 63 y.o. female with PMHX of htn, DM, afib, lung ca, PVD who has had long standing problems with left knee. Initially had L-TKA 2004, revised in 2007 but most recently concerned for septic prosthetic joint. She underwent 2 staged revision with L-TKA resection and antibiotic spacer placed on 11/05/13. Cx negative, placed on 8 wks of vancomycin and ceftriaxone until her surgery on 12/31/13 where she had re-implantation/revision of left TKA. Her inflammatory markers were still elevated prior to surgery however, on clinical inspection in the OR it did not appear that she had ongoing infection since she had clear synovial fluid. She does have significant bone loss of both tibia and femoral side from previous surgeries. Due to her inflammatory markers being elevated, Dr. Alvan Dame would like to continue antibiotics for a few weeks post surgery to ensure no smoldering infection and give new implant best chance of successful implantation/retention given there will limited option if this fails. Patient denies fever, chills, nightsweats.  Past Medical History  Diagnosis Date  . Hypertension   . Hypercholesteremia   . Cancer 5/10    LUL Vats   . Lung cancer   . Peripheral vascular disease 8/12    Lt SFA PTA  . Sleep apnea     " mild form ,does not use cpap "  . Diabetes mellitus     insulin dependent  . GERD (gastroesophageal reflux disease)   . H/O hiatal hernia   . Arthritis   . Non-STEMI (non-ST elevated myocardial infarction) 04/07/2012    See cath results below  . Presence of stent in LAD coronary artery 04/07/2012    Xience Expedition DES 2.75 mm x 18 mm (dilated to 3.0 mm)  . Paroxysmal atrial fibrillation 04/07/2012    On  Warfarin  . Heart palpitations     monitor 04/19/12-05/10/12- frequent PAF with RVR- coumadin started    Allergies:  Allergies  Allergen Reactions  . Levofloxacin Itching  . Morphine And Related Itching      MEDICATIONS: . aspirin  81 mg Oral Daily  . docusate sodium  100 mg Oral BID  . enoxaparin (LOVENOX) injection  40 mg Subcutaneous Q12H  . ferrous sulfate  325 mg Oral TID PC  . insulin aspart  0-15 Units Subcutaneous TID WC  . insulin aspart protamine- aspart  25 Units Subcutaneous Q supper  . insulin aspart protamine- aspart  33 Units Subcutaneous Q breakfast  . metoprolol  50 mg Oral BID  . oxyCODONE  5-15 mg Oral Q4H  . pantoprazole  80 mg Oral Daily  . polyethylene glycol  17 g Oral BID  . simvastatin  40 mg Oral QPM  . sodium chloride  10-40 mL Intracatheter Q12H  . [START ON 01/03/2014] vancomycin  1,750 mg Intravenous Q24H  . vancomycin  2,000 mg Intravenous Once  . warfarin  15 mg Oral ONCE-1800  . Warfarin - Pharmacist Dosing Inpatient   Does not apply q1800    History  Substance Use Topics  . Smoking status: Former Smoker -- 1.00 packs/day for 20 years    Types: Cigarettes  Quit date: 09/27/1984  . Smokeless tobacco: Never Used  . Alcohol Use: No    Family History  Problem Relation Age of Onset  . Hypertension Mother   . Coronary artery disease Brother   . Hypertension Sister      Review of Systems  Constitutional: Negative for fever, chills, diaphoresis, activity change, appetite change, fatigue and unexpected weight change.  HENT: Negative for congestion, sore throat, rhinorrhea, sneezing, trouble swallowing and sinus pressure.  Eyes: Negative for photophobia and visual disturbance.  Respiratory: Negative for cough, chest tightness, shortness of breath, wheezing and stridor.  Cardiovascular: Negative for chest pain, palpitations and leg swelling.  Gastrointestinal: Negative for nausea, vomiting, abdominal pain, diarrhea, constipation, blood in  stool, abdominal distention and anal bleeding.  Genitourinary: Negative for dysuria, hematuria, flank pain and difficulty urinating.  Musculoskeletal: Negative for myalgias, back pain, joint swelling, arthralgias and gait problem.  Skin: Negative for color change, pallor, rash and wound.  Neurological: Negative for dizziness, tremors, weakness and light-headedness.  Hematological: Negative for adenopathy. Does not bruise/bleed easily.  Psychiatric/Behavioral: Negative for behavioral problems, confusion, sleep disturbance, dysphoric mood, decreased concentration and agitation.     OBJECTIVE: Temp:  [98.1 F (36.7 C)-99.5 F (37.5 C)] 99.5 F (37.5 C) (04/08 0532) Pulse Rate:  [78-94] 89 (04/08 0532) Resp:  [16-20] 20 (04/08 0532) BP: (114-157)/(58-80) 157/80 mmHg (04/08 0532) SpO2:  [96 %-100 %] 99 % (04/08 0532) Physical Exam  Constitutional:  oriented to person, place, and time. appears well-developed and well-nourished. No distress.  HENT:  Mouth/Throat: Oropharynx is clear and moist. No oropharyngeal exudate.  Cardiovascular: Normal rate, regular rhythm and normal heart sounds. Exam reveals no gallop and no friction rub.  No murmur heard.  Pulmonary/Chest: Effort normal and breath sounds normal. No respiratory distress.  has no wheezes.  Abdominal: Soft. Bowel sounds are normal.  exhibits no distension. There is no tenderness.  Lymphadenopathy: no cervical adenopathy.  Neurological: alert and oriented to person, place, and time.  Skin: Skin is warm and dry. No rash noted. No erythema.  Ext: left knee wrapped from surgery Psychiatric: a normal mood and affect.  behavior is normal.   LABS: Results for orders placed during the hospital encounter of 12/31/13 (from the past 48 hour(s))  GLUCOSE, CAPILLARY     Status: Abnormal   Collection Time    12/31/13 11:39 AM      Result Value Ref Range   Glucose-Capillary 125 (*) 70 - 99 mg/dL   Comment 1 Documented in Chart    TYPE AND  SCREEN     Status: None   Collection Time    12/31/13 11:55 AM      Result Value Ref Range   ABO/RH(D) A POS     Antibody Screen NEG     Sample Expiration 01/03/2014    PROTIME-INR     Status: None   Collection Time    12/31/13 11:55 AM      Result Value Ref Range   Prothrombin Time 13.4  11.6 - 15.2 seconds   INR 1.04  0.00 - 1.49  GLUCOSE, CAPILLARY     Status: Abnormal   Collection Time    12/31/13  3:09 PM      Result Value Ref Range   Glucose-Capillary 196 (*) 70 - 99 mg/dL  GLUCOSE, CAPILLARY     Status: Abnormal   Collection Time    12/31/13  5:09 PM      Result Value Ref Range   Glucose-Capillary 230 (*)  70 - 99 mg/dL   Comment 1 Documented in Chart     Comment 2 Notify RN    GLUCOSE, CAPILLARY     Status: Abnormal   Collection Time    12/31/13  9:57 PM      Result Value Ref Range   Glucose-Capillary 220 (*) 70 - 99 mg/dL  GLUCOSE, CAPILLARY     Status: Abnormal   Collection Time    01/01/14  7:22 AM      Result Value Ref Range   Glucose-Capillary 258 (*) 70 - 99 mg/dL   Comment 1 Notify RN     Comment 2 Documented in Chart    CBC     Status: Abnormal   Collection Time    01/01/14  8:20 AM      Result Value Ref Range   WBC 8.8  4.0 - 10.5 K/uL   RBC 3.53 (*) 3.87 - 5.11 MIL/uL   Hemoglobin 10.2 (*) 12.0 - 15.0 g/dL   HCT 30.6 (*) 36.0 - 46.0 %   MCV 86.7  78.0 - 100.0 fL   MCH 28.9  26.0 - 34.0 pg   MCHC 33.3  30.0 - 36.0 g/dL   RDW 13.3  11.5 - 15.5 %   Platelets 201  150 - 400 K/uL  BASIC METABOLIC PANEL     Status: Abnormal   Collection Time    01/01/14  8:20 AM      Result Value Ref Range   Sodium 137  137 - 147 mEq/L   Potassium 4.9  3.7 - 5.3 mEq/L   Chloride 97  96 - 112 mEq/L   CO2 26  19 - 32 mEq/L   Glucose, Bld 261 (*) 70 - 99 mg/dL   BUN 14  6 - 23 mg/dL   Creatinine, Ser 0.96  0.50 - 1.10 mg/dL   Calcium 9.5  8.4 - 10.5 mg/dL   GFR calc non Af Amer 61 (*) >90 mL/min   GFR calc Af Amer 71 (*) >90 mL/min   Comment: (NOTE)     The  eGFR has been calculated using the CKD EPI equation.     This calculation has not been validated in all clinical situations.     eGFR's persistently <90 mL/min signify possible Chronic Kidney     Disease.  PROTIME-INR     Status: None   Collection Time    01/01/14  8:20 AM      Result Value Ref Range   Prothrombin Time 14.4  11.6 - 15.2 seconds   INR 1.14  0.00 - 1.49  GLUCOSE, CAPILLARY     Status: Abnormal   Collection Time    01/01/14 12:39 PM      Result Value Ref Range   Glucose-Capillary 128 (*) 70 - 99 mg/dL  GLUCOSE, CAPILLARY     Status: None   Collection Time    01/01/14  5:01 PM      Result Value Ref Range   Glucose-Capillary 92  70 - 99 mg/dL   Comment 1 Notify RN     Comment 2 Documented in Chart    GLUCOSE, CAPILLARY     Status: Abnormal   Collection Time    01/01/14  9:23 PM      Result Value Ref Range   Glucose-Capillary 167 (*) 70 - 99 mg/dL   Comment 1 Documented in Chart     Comment 2 Notify RN    CBC     Status: Abnormal  Collection Time    01/02/14  4:50 AM      Result Value Ref Range   WBC 7.6  4.0 - 10.5 K/uL   RBC 2.70 (*) 3.87 - 5.11 MIL/uL   Hemoglobin 7.8 (*) 12.0 - 15.0 g/dL   Comment: DELTA CHECK NOTED     REPEATED TO VERIFY   HCT 23.9 (*) 36.0 - 46.0 %   MCV 88.5  78.0 - 100.0 fL   MCH 28.9  26.0 - 34.0 pg   MCHC 32.6  30.0 - 36.0 g/dL   RDW 13.6  11.5 - 15.5 %   Platelets 168  150 - 400 K/uL  BASIC METABOLIC PANEL     Status: Abnormal   Collection Time    01/02/14  4:50 AM      Result Value Ref Range   Sodium 137  137 - 147 mEq/L   Potassium 4.3  3.7 - 5.3 mEq/L   Chloride 103  96 - 112 mEq/L   CO2 26  19 - 32 mEq/L   Glucose, Bld 165 (*) 70 - 99 mg/dL   BUN 14  6 - 23 mg/dL   Creatinine, Ser 0.85  0.50 - 1.10 mg/dL   Calcium 9.1  8.4 - 10.5 mg/dL   GFR calc non Af Amer 71 (*) >90 mL/min   GFR calc Af Amer 82 (*) >90 mL/min   Comment: (NOTE)     The eGFR has been calculated using the CKD EPI equation.     This calculation has  not been validated in all clinical situations.     eGFR's persistently <90 mL/min signify possible Chronic Kidney     Disease.  PROTIME-INR     Status: None   Collection Time    01/02/14  4:50 AM      Result Value Ref Range   Prothrombin Time 14.4  11.6 - 15.2 seconds   INR 1.14  0.00 - 1.49  GLUCOSE, CAPILLARY     Status: Abnormal   Collection Time    01/02/14  7:15 AM      Result Value Ref Range   Glucose-Capillary 152 (*) 70 - 99 mg/dL   Comment 1 Notify RN     Comment 2 Documented in Chart      MICRO: none IMAGING: Dg Chest Port 1 View  01/01/2014   CLINICAL DATA:  PICC placement  EXAM: PORTABLE CHEST - 1 VIEW  COMPARISON:  12/31/2013  FINDINGS: Right arm PICC tip at the cavoatrial junction, having been withdrawn since yesterday study.  Improvement and bilateral airspace disease since yesterday. Surgical clips left hilar region  IMPRESSION: PICC tip at the cavoatrial junction  Improvement in bilateral airspace disease suggestive of pulmonary edema.   Electronically Signed   By: Franchot Gallo M.D.   On: 01/01/2014 12:12   Dg Chest Port 1 View  01/01/2014   ADDENDUM REPORT: 01/01/2014 11:29  ADDENDUM: It is recommended that the PICC line be withdrawn approximately 3-4 cm and repeat chest radiograph be performed to reconfirm position.   Electronically Signed   By: Sabino Dick M.D.   On: 01/01/2014 11:29   01/01/2014   CLINICAL DATA:  PICC line placement.  EXAM: PORTABLE CHEST - 1 VIEW  COMPARISON:  01/23/2011  FINDINGS: Right-sided PICC line is present with tip overlying the right atrium. Lungs are hypoinflated with mild perihilar opacification compatible with interstitial edema slightly worse. There is likely a small amount a left pleural fluid unchanged. Mild stable cardiomegaly. Remainder the  exam is unchanged.  IMPRESSION: Hypoinflation with perihilar opacification likely mild interstitial edema slightly worse. Possible small amount left pleural fluid.  Right-sided PICC line with tip  overlying the region of the right atrium.  Electronically Signed: By: Marin Olp M.D. On: 12/31/2013 20:11    Assessment/Plan:  63yo F with culture negative prosthetic joint infection s/p resection and antibiotic spacer who is POD#2 from new reimplantation/revision of L-TKA  - will check sed rate and crp - continue on vancomycin and ceftriaxone 2gm IV daily. Will need weekly vancomycin trough (goal 15-20), weekly bmp x 2 wk until she is seen in ID office  [facility] [phone]

## 2014-01-02 NOTE — Progress Notes (Signed)
Physical Therapy Treatment Patient Details Name: [name] MRN: [id number] DOB: [birth year] Today's Date: 01/02/2014    History of Present Illness 63 yo female s/p L knee resection and spacer 11/05/13, admitted for reimplantation.    PT Comments    Pt c/o feeling dizzy while ambulating. BP 97/50, sats 88 RA, HR 100  returned to room via recliner, feet elevated w/ BP158/70, HR 87, sats 100% 2 l.   RN notified.  Follow Up Recommendations        Equipment Recommendations  None recommended by PT    Recommendations for Other Services       Precautions / Restrictions Precautions Precautions: Fall;Knee Required Braces or Orthoses: Knee Immobilizer - Left Knee Immobilizer - Left: On when out of bed or walking    Mobility  Bed Mobility Overal bed mobility: Needs Assistance Bed Mobility: Supine to Sit     Supine to sit: Mod assist     General bed mobility comments: extra time to move to edge, use of rail  Transfers Overall transfer level: Needs assistance Equipment used: Rolling walker (2 wheeled) Transfers: Sit to/from Stand Sit to Stand: +2 safety/equipment;+2 physical assistance;Mod assist;From elevated surface         General transfer comment: cues for hand placement,  Ambulation/Gait Ambulation/Gait assistance: +2 safety/equipment;Mod assist Ambulation Distance (Feet): 25 Feet Assistive device: Rolling walker (2 wheeled) Gait Pattern/deviations: Step-to pattern;Antalgic     General Gait Details: extra time to work through pain and nausea, became dizzy, chair brought up.   Stairs            Wheelchair Mobility    Modified Rankin (Stroke Patients Only)       Balance                                    Cognition Arousal/Alertness: Awake/alert                          Exercises      General Comments        Pertinent Vitals/Pain Pain is mild, VS in PT comments.    Home Living                       Prior Function            PT Goals (current goals can now be found in the care plan section) Progress towards PT goals: Progressing toward goals    Frequency  7X/week    PT Plan Current plan remains appropriate    Co-evaluation             End of Session Equipment Utilized During Treatment: Gait belt Activity Tolerance: Treatment limited secondary to medical complications (Comment) (hypotension) Patient left: in chair;with call bell/phone within reach     Time: 1225-1249 PT Time Calculation (min): 24 min  Charges:  $Gait Training: 23-37 mins                    G Codes:      Claretha Cooper 01/02/2014, 2:31 PM

## 2014-01-02 NOTE — Progress Notes (Signed)
Physical Therapy Treatment Patient Details Name: [name] MRN: [id number] DOB: 1951/01/10 Today's Date: 01/02/2014    History of Present Illness 63 yo female s/p L knee resection and spacer 11/05/13, admitted for reimplantation.    PT Comments    Pt requires extra time for mobilizing, 2 assist to stand and pivot. BP 154/75. RN aware.  Follow Up Recommendations  SNF     Equipment Recommendations  None recommended by PT    Recommendations for Other Services       Precautions / Restrictions Precautions Precautions: Fall;Knee Required Braces or Orthoses: Knee Immobilizer - Left Knee Immobilizer - Left: On when out of bed or walking    Mobility  Bed Mobility Overal bed mobility: Needs Assistance Bed Mobility: Sit to Supine     Supine to sit: Mod assist Sit to supine: +2 for physical assistance;Max assist;+2 for safety/equipment   General bed mobility comments: support of LLE onto bed.  Transfers Overall transfer level: Needs assistance Equipment used: Rolling walker (2 wheeled) Transfers: Sit to/from [facility] Sit to Stand: Total assist Stand pivot transfers: +2 physical assistance;+2 safety/equipment;Max assist       General transfer comment: from recliner without the KI was much more difficult.  Ambulation/Gait Ambulation/Gait assistance: +2 safety/equipment;Mod assist Ambulation Distance (Feet): 25 Feet Assistive device: Rolling walker (2 wheeled) Gait Pattern/deviations: Step-to pattern;Antalgic     General Gait Details: extra time to work through pain and nausea, became dizzy, chair brought up.   Stairs            Wheelchair Mobility    Modified Rankin (Stroke Patients Only)       Balance                                    Cognition Arousal/Alertness: Awake/alert                          Exercises Total Joint Exercises Short Arc QuadSinclair Ship;Left;15 reps;Seated Heel Slides:  AAROM;Left;15 reps;Supine Hip ABduction/ADduction: AAROM;Left;15 reps;Seated Straight Leg Raises: AAROM;Left;15 reps;Supine Long Arc Quad: AAROM;Left;15 reps;Seated Knee Flexion: AAROM;Left;15 reps;Seated Goniometric ROM: 10-50    General Comments        Pertinent Vitals/Pain min c./o pain except with seated flexion.    Home Living                      Prior Function            PT Goals (current goals can now be found in the care plan section) Progress towards PT goals: Progressing toward goals    Frequency  7X/week    PT Plan Current plan remains appropriate    Co-evaluation             End of Session Equipment Utilized During Treatment: Gait belt Activity Tolerance: Patient limited by fatigue Patient left: in bed;with call bell/phone within reach;with family/visitor present     Time: 9485-4627 PT Time Calculation (min): 41 min  Charges:  $Gait Training: 23-37 mins $Therapeutic Exercise: 23-37 mins $Therapeutic Activity: 8-22 mins                    G Codes:      Claretha Cooper 01/02/2014, 4:20 PM

## 2014-01-02 NOTE — Progress Notes (Signed)
   Subjective: 2 Days Post-Op Procedure(s) (LRB): RE-INPLANTATION LEFT TOTAL KNEE  (Left)   Patient reports pain as moderate, pain with movement of the knee.  States that she has worked with PT, but hasn't done much. No events throughout the night.  Objective:   VITALS:   Filed Vitals:   01/02/14 0532  BP: 157/80  Pulse: 89  Temp: 99.5 F (37.5 C)  Resp: 20    Neurovascular intact Dorsiflexion/Plantar flexion intact Incision: dressing C/D/I No cellulitis present Compartment soft  LABS  Recent Labs  01/01/14 0820 01/02/14 0450  HGB 10.2* 7.8*  HCT 30.6* 23.9*  WBC 8.8 7.6  PLT 201 168     Recent Labs  01/01/14 0820 01/02/14 0450  NA 137 137  K 4.9 4.3  BUN 14 14  CREATININE 0.96 0.85  GLUCOSE 261* 165*     Assessment/Plan: 2 Days Post-Op Procedure(s) (LRB): RE-INPLANTATION LEFT TOTAL KNEE  (Left) Foley was maintained do to monitoring of I&O's  Norco was changed to Oxycodone to try to better control pain. ID to see patient for suggesting antibiotic for the patient to remain on for 4-6 weeks. Up with therapy Discharge to SNF eventually, when ready  Expected ABLA  Treated with iron and will observe  Morbid Obesity (BMI >40)  Patient also counseled that weight may inhibit the healing process Patient counseled that losing weight will help with future health issues     [place]. Benino Korinek   PAC  01/02/2014, 9:40 AM

## 2014-01-02 NOTE — Progress Notes (Signed)
I did not take pt's foley cath out due to low hgb of 7.8 this morning from 10.2 yesterday. Thanks Jamila.

## 2014-01-02 NOTE — Care Management Note (Signed)
    Page 1 of 1   01/02/2014     11:00:47 AM   CARE MANAGEMENT NOTE 01/02/2014  Patient:  [name], [name]   Account Number:  [id number]  Date Initiated:  01/02/2014  Documentation initiated by:  [facility]  Subjective/Objective Assessment:   63 year old female admitted s/p Status post resection of infected left total  knee arthroplasty.     Action/Plan:   Will return to [facility] SN[facility] at d/c.   Anticipated DC Date:  01/05/2014   Anticipated DC Plan:  SKILLED NURSING FACILITY  In-house referral  Clinical Social Worker      DC Planning Services  CM consult      Choice offered to / List presented to:             Status of service:  Completed, signed off Medicare Important Message given?  NA - LOS <3 / Initial given by admissions (If response is "NO", the following Medicare IM given date fields will be blank) Date Medicare IM given:   Date Additional Medicare IM given:    Discharge Disposition:  [facility]  Per UR Regulation:  Reviewed for med. necessity/level of care/duration of stay  If discussed at [place] of Stay Meetings, dates discussed:    Comments:

## 2014-01-02 NOTE — Progress Notes (Signed)
ANTIBIOTIC CONSULT NOTE - INITIAL  Pharmacy Consult for vancomycin Indication: Hx of infected TKA, now s/p re-implantation of new prosthesis  Allergies  Allergen Reactions  . Levofloxacin Itching  . Morphine And Related Itching    Patient Measurements:   12/21/13: Ht:   160 cm Wt:  131.5 kg  Vital Signs: Temp: 99.5 F (37.5 C) (04/08 0532) Temp src: Oral (04/08 0532) BP: 157/80 mmHg (04/08 0532) Pulse Rate: 89 (04/08 0532) Intake/Output from previous day: 04/07 0701 - 04/08 0700 In: 3070 [P.O.:360; I.V.:2710] Out: 825 [Urine:825] Intake/Output from this shift:    Labs:  Recent Labs  01/01/14 0820 01/02/14 0450  WBC 8.8 7.6  HGB 10.2* 7.8*  PLT 201 168  CREATININE 0.96 0.85   The CrCl is unknown because both a height and weight (above a minimum accepted value) are required for this calculation. No results found for this basename: VANCOTROUGH, Corlis Leak, VANCORANDOM, GENTTROUGH, GENTPEAK, GENTRANDOM, TOBRATROUGH, TOBRAPEAK, TOBRARND, AMIKACINPEAK, AMIKACINTROU, AMIKACIN,  in the last 72 hours   Microbiology: Recent Results (from the past 720 hour(s))  SURGICAL PCR SCREEN     Status: None   Collection Time    12/21/13 11:26 AM      Result Value Ref Range Status   MRSA, PCR NEGATIVE  NEGATIVE Final   Staphylococcus aureus NEGATIVE  NEGATIVE Final   Comment:            The Xpert SA Assay (FDA     approved for NASAL specimens     in patients over 66 years of age),     is one component of     a comprehensive surveillance     program.  Test performance has     been validated by [employer] for patients greater     than or equal to 51 year old.     It is not intended     to diagnose infection nor to     guide or monitor treatment.    Medical History: Past Medical History  Diagnosis Date  . Hypertension   . Hypercholesteremia   . Cancer 5/10    LUL Vats   . Lung cancer   . Peripheral vascular disease 8/12    Lt SFA PTA  . Sleep apnea     " mild  form ,does not use cpap "  . Diabetes mellitus     insulin dependent  . GERD (gastroesophageal reflux disease)   . H/O hiatal hernia   . Arthritis   . Non-STEMI (non-ST elevated myocardial infarction) 04/07/2012    See cath results below  . Presence of stent in LAD coronary artery 04/07/2012    Xience Expedition DES 2.75 mm x 18 mm (dilated to 3.0 mm)  . Paroxysmal atrial fibrillation 04/07/2012    On Warfarin  . Heart palpitations     monitor 04/19/12-05/10/12- frequent PAF with RVR- coumadin started    Medications:  Scheduled:  . aspirin  81 mg Oral Daily  . docusate sodium  100 mg Oral BID  . enoxaparin (LOVENOX) injection  40 mg Subcutaneous Q12H  . ferrous sulfate  325 mg Oral TID PC  . insulin aspart  0-15 Units Subcutaneous TID WC  . insulin aspart protamine- aspart  25 Units Subcutaneous Q supper  . insulin aspart protamine- aspart  33 Units Subcutaneous Q breakfast  . metoprolol  50 mg Oral BID  . oxyCODONE  5-15 mg Oral Q4H  . pantoprazole  80 mg Oral Daily  .  polyethylene glycol  17 g Oral BID  . simvastatin  40 mg Oral QPM  . sodium chloride  10-40 mL Intracatheter Q12H  . warfarin  15 mg Oral ONCE-1800  . Warfarin - Pharmacist Dosing Inpatient   Does not apply q1800   Infusions:  . sodium chloride 0.9 % 1,000 mL with potassium chloride 10 mEq infusion 100 mL/hr at 01/01/14 2354   PRN: albuterol, alum & mag hydroxide-simeth, bisacodyl, diphenhydrAMINE, HYDROmorphone (DILAUDID) injection, menthol-cetylpyridinium, methocarbamol (ROBAXIN) IV, methocarbamol, metoCLOPramide (REGLAN) injection, metoCLOPramide, ondansetron (ZOFRAN) IV, ondansetron, phenol, promethazine, sodium chloride  Assessment: 63 y/o F with previous infection of L TKA, s/p resection 11/05/13 with subsequent treatment x 8 weeks with IV vancomycin, now s/p reimplantation of new prosthesis 12/31/13.   Orders received to resume IV vancomycin with pharmacy dosing assistance.  Prior vancomycin dosing records  reviewed.  Patient was initially treated with the nomogram-recommended dosage of 2500 mg loading dose then 1250 mg q12h, but this lead to a supratherapeutic vancomycin level and an increased SCr.   A discharge dosage of 1250 mg q24h was recommended with further adjustments post-discharge.  According to records from [place] office and SNF, most recent vancomycin dosage prior to admission was 1750 mg IV q24h.     Serum creatinine today is 0.85.  Estimated normalized CrCl ~ 75 mL/min/72kg  Vancomycin 1500 mg IV x 1 was administered preoperatively on 4/6.  Goal of Therapy:  Appropriate antibiotic dosing for renal function; eradication of infection. Vancomycin trough 15-20   Plan:  1. Vancomycin 2000 mg IV x 1 dose today. 2. Vancomycin 1750 mg IV q24h starting tomorrow.  3. Follow serum creatinine and clinical course. 4. Check vancomycin trough at steady-state and adjust dosage as needed. 5. Await recommendations from ID physician on duration of therapy.  Clayburn Pert, PharmD, BCPS Pager: [phone] 01/02/2014  10:11 AM

## 2014-01-03 DIAGNOSIS — D500 Iron deficiency anemia secondary to blood loss (chronic): Secondary | ICD-10-CM | POA: Diagnosis not present

## 2014-01-03 DIAGNOSIS — K219 Gastro-esophageal reflux disease without esophagitis: Secondary | ICD-10-CM | POA: Diagnosis not present

## 2014-01-03 DIAGNOSIS — IMO0001 Reserved for inherently not codable concepts without codable children: Secondary | ICD-10-CM | POA: Diagnosis not present

## 2014-01-03 DIAGNOSIS — I2510 Atherosclerotic heart disease of native coronary artery without angina pectoris: Secondary | ICD-10-CM | POA: Diagnosis not present

## 2014-01-03 DIAGNOSIS — S99919A Unspecified injury of unspecified ankle, initial encounter: Secondary | ICD-10-CM | POA: Diagnosis not present

## 2014-01-03 DIAGNOSIS — Z471 Aftercare following joint replacement surgery: Secondary | ICD-10-CM | POA: Diagnosis not present

## 2014-01-03 DIAGNOSIS — K5900 Constipation, unspecified: Secondary | ICD-10-CM | POA: Diagnosis not present

## 2014-01-03 DIAGNOSIS — L089 Local infection of the skin and subcutaneous tissue, unspecified: Secondary | ICD-10-CM | POA: Diagnosis not present

## 2014-01-03 DIAGNOSIS — I10 Essential (primary) hypertension: Secondary | ICD-10-CM | POA: Diagnosis not present

## 2014-01-03 DIAGNOSIS — S8990XA Unspecified injury of unspecified lower leg, initial encounter: Secondary | ICD-10-CM | POA: Diagnosis not present

## 2014-01-03 DIAGNOSIS — M6281 Muscle weakness (generalized): Secondary | ICD-10-CM | POA: Diagnosis not present

## 2014-01-03 DIAGNOSIS — M25569 Pain in unspecified knee: Secondary | ICD-10-CM | POA: Diagnosis not present

## 2014-01-03 DIAGNOSIS — R262 Difficulty in walking, not elsewhere classified: Secondary | ICD-10-CM | POA: Diagnosis not present

## 2014-01-03 DIAGNOSIS — T148XXA Other injury of unspecified body region, initial encounter: Secondary | ICD-10-CM | POA: Diagnosis not present

## 2014-01-03 DIAGNOSIS — Z96659 Presence of unspecified artificial knee joint: Secondary | ICD-10-CM | POA: Diagnosis not present

## 2014-01-03 DIAGNOSIS — M869 Osteomyelitis, unspecified: Secondary | ICD-10-CM | POA: Diagnosis not present

## 2014-01-03 DIAGNOSIS — Z5181 Encounter for therapeutic drug level monitoring: Secondary | ICD-10-CM | POA: Diagnosis not present

## 2014-01-03 DIAGNOSIS — D649 Anemia, unspecified: Secondary | ICD-10-CM | POA: Diagnosis not present

## 2014-01-03 DIAGNOSIS — M009 Pyogenic arthritis, unspecified: Secondary | ICD-10-CM | POA: Diagnosis not present

## 2014-01-03 DIAGNOSIS — R710 Precipitous drop in hematocrit: Secondary | ICD-10-CM | POA: Diagnosis not present

## 2014-01-03 DIAGNOSIS — I4891 Unspecified atrial fibrillation: Secondary | ICD-10-CM | POA: Diagnosis not present

## 2014-01-03 LAB — GLUCOSE, CAPILLARY
GLUCOSE-CAPILLARY: 92 mg/dL (ref 70–99)
Glucose-Capillary: 149 mg/dL — ABNORMAL HIGH (ref 70–99)
Glucose-Capillary: 185 mg/dL — ABNORMAL HIGH (ref 70–99)

## 2014-01-03 LAB — BASIC METABOLIC PANEL
BUN: 10 mg/dL (ref 6–23)
CO2: 28 meq/L (ref 19–32)
Calcium: 8.9 mg/dL (ref 8.4–10.5)
Chloride: 104 mEq/L (ref 96–112)
Creatinine, Ser: 0.71 mg/dL (ref 0.50–1.10)
GFR calc Af Amer: >90 mL/min (ref >90)
GFR calc non Af Amer: 89 mL/min — ABNORMAL LOW (ref >90)
Glucose, Bld: 94 mg/dL (ref 70–99)
POTASSIUM: 4 meq/L (ref 3.7–5.3)
SODIUM: 140 meq/L (ref 137–147)

## 2014-01-03 LAB — PREPARE RBC (CROSSMATCH)

## 2014-01-03 LAB — PROTIME-INR
INR: 1.4 (ref 0.00–1.49)
Prothrombin Time: 16.8 seconds — ABNORMAL HIGH (ref 11.6–15.2)

## 2014-01-03 LAB — CBC
HCT: 20.8 % — ABNORMAL LOW (ref 36.0–46.0)
Hemoglobin: 6.7 g/dL — CL (ref 12.0–15.0)
MCH: 28.5 pg (ref 26.0–34.0)
MCHC: 32.2 g/dL (ref 30.0–36.0)
MCV: 88.5 fL (ref 78.0–100.0)
PLATELETS: 169 10*3/uL (ref 150–400)
RBC: 2.35 MIL/uL — AB (ref 3.87–5.11)
RDW: 13.4 % (ref 11.5–15.5)
WBC: 7.8 10*3/uL (ref 4.0–10.5)

## 2014-01-03 MED ORDER — TIZANIDINE HCL 4 MG PO TABS: 4.0000 mg | ORAL_TABLET | Freq: Three times a day (TID) | ORAL | Status: DC | PRN

## 2014-01-03 MED ORDER — WARFARIN SODIUM 5 MG PO TABS: 2.5000 mg | ORAL_TABLET | Freq: Every day | ORAL | Status: DC

## 2014-01-03 MED ORDER — ENOXAPARIN SODIUM 40 MG/0.4ML ~~LOC~~ SOLN: 40.0000 mg | Freq: Two times a day (BID) | SUBCUTANEOUS | Status: DC

## 2014-01-03 MED ORDER — ACETAMINOPHEN 500 MG PO TABS
1000.0000 mg | ORAL_TABLET | Freq: Once | ORAL | Status: AC
  Administered 2014-01-03: 1000 mg via ORAL
  Filled 2014-01-03: qty 2

## 2014-01-03 MED ORDER — VANCOMYCIN HCL 10 G IV SOLR: 1750.0000 mg | INTRAVENOUS | Status: DC

## 2014-01-03 MED ORDER — DEXTROSE 5 % IV SOLN: 2.0000 g | INTRAVENOUS | Status: DC

## 2014-01-03 MED ORDER — WARFARIN SODIUM 2.5 MG PO TABS
12.5000 mg | ORAL_TABLET | Freq: Once | ORAL | Status: AC
  Administered 2014-01-03: 12.5 mg via ORAL
  Filled 2014-01-03: qty 1

## 2014-01-03 MED ORDER — FUROSEMIDE 20 MG PO TABS
10.0000 mg | ORAL_TABLET | Freq: Once | ORAL | Status: AC
  Administered 2014-01-03: 10 mg via ORAL
  Filled 2014-01-03: qty 0.5

## 2014-01-03 MED ORDER — HEPARIN SOD (PORK) LOCK FLUSH 100 UNIT/ML IV SOLN
250.0000 [IU] | INTRAVENOUS | Status: AC | PRN
  Administered 2014-01-03: 20:00:00

## 2014-01-03 MED ORDER — OXYCODONE HCL 5 MG PO TABS
5.0000 mg | ORAL_TABLET | ORAL | Status: DC | PRN
Start: 2014-01-03 — End: 2014-01-04

## 2014-01-03 NOTE — Progress Notes (Signed)
Pt is planning to return to [facility] today. D/C summary has been faxed to SNF. [facility] will arrange transportation for pt when treatment is completed. Pt is aware of pending d/c and is in agreement with d/c plan.  Werner Lean LCSW [phone]

## 2014-01-03 NOTE — Progress Notes (Addendum)
Physical Therapy Treatment Patient Details Name: [name] MRN: [id number] DOB: [birth year] Today's Date: 01/03/2014    History of Present Illness 63 yo female s/p L knee resection and spacer 11/05/13, admitted for reimplantation.    PT Comments    Pt tolerated there ex in bed. Getting 2 units blood, ? DC to snf today.  Follow Up Recommendations  SNF     Equipment Recommendations  None recommended by PT    Recommendations for Other Services       Precautions / Restrictions Precautions Precautions: Fall;Knee Required Braces or Orthoses: Knee Immobilizer - Left Knee Immobilizer - Left: On when out of bed or walking    Mobility  Bed Mobility Overal bed mobility: Needs Assistance Bed Mobility: Rolling Rolling: Mod assist         General bed mobility comments: support of R leg to roll, use of rails.  Transfers                    Ambulation/Gait                 Stairs            Wheelchair Mobility    Modified Rankin (Stroke Patients Only)       Balance                                    Cognition Arousal/Alertness: Awake/alert                          Exercises Total Joint Exercises Ankle Circles/Pumps: AROM;Both;Supine;20 reps Quad Sets: AROM;Both;20 reps;Supine Short Arc Quad: AAROM;Left;20 reps;Supine Heel Slides: AAROM;Left;20 reps;Supine Hip ABduction/ADduction: AAROM;Left;20 reps;Supine Straight Leg Raises: AAROM;Left;20 reps;Supine    General Comments        Pertinent Vitals/Pain Pt reports pain is mostly Lateral posterior Knee L, able to palpate what feels like Hamstring tendon.    Home Living                      Prior Function            PT Goals (current goals can now be found in the care plan section) Progress towards PT goals: Progressing toward goals    Frequency  7X/week    PT Plan Current plan remains appropriate    Co-evaluation             End of  Session           Time: 1117-1150 PT Time Calculation (min): 33 min  Charges:  $Therapeutic Exercise: 8-22 mins $Therapeutic Activity: 8-22 mins                    G Codes:      Claretha Cooper 01/03/2014, 12:16 PM

## 2014-01-03 NOTE — Progress Notes (Signed)
   Subjective: 3 Days Post-Op Procedure(s) (LRB): RE-INPLANTATION LEFT TOTAL KNEE  (Left)   Patient reports pain as mild, pain controlled. H&H is 6.7/20.8. Otherwise no events throughout the night. Discussed her lab results and transfusing blood. She realizes that it is under 7 and ok with receiving blood.  Denies any CP or SOB.  Ready to be discharged to SNF after receiving blood.  Objective:   VITALS:   Filed Vitals:   01/03/14  BP: 156/78  Pulse: 102  Temp: 99.6 F (37.6 C)   Resp: 18    Neurovascular intact Dorsiflexion/Plantar flexion intact Incision: dressing C/D/I No cellulitis present Compartment soft  LABS  Recent Labs  01/01/14 0820 01/02/14 0450 01/03/14 0430  HGB 10.2* 7.8* 6.7*  HCT 30.6* 23.9* 20.8*  WBC 8.8 7.6 7.8  PLT 201 168 169     Recent Labs  01/01/14 0820 01/02/14 0450 01/03/14 0430  NA 137 137 140  K 4.9 4.3 4.0  BUN 14 14 10   CREATININE 0.96 0.85 0.71  GLUCOSE 261* 165* 94     Assessment/Plan: 3 Days Post-Op Procedure(s) (LRB): RE-INPLANTATION LEFT TOTAL KNEE  (Left) ACE bandage removed Advance diet Up with therapy Discharge to SNF Follow up in 2 weeks at [facility]. Follow up with OLIN,Nekesha Font D in 2 weeks.  Contact information:  [facility] [address] [phone]    ABLA  Treated with 2 units of blood.  Morbid Obesity (BMI >40)  Estimated body mass index is 51.38 kg/(m^2) as calculated from the following:   Height as of this encounter: 5\' 3"  (1.6 m).   Weight as of this encounter: 131.543 kg (290 lb). Patient also counseled that weight may inhibit the healing process Patient counseled that losing weight will help with future health issues        West Pugh. Dewanda Fennema   PAC  01/03/2014, 8:03 AM

## 2014-01-03 NOTE — Progress Notes (Signed)
ANTICOAGULATION CONSULT NOTE - Follow up  Pharmacy Consult for Warfarin  Indication Hx A.Fib: VTE prophylaxis s/p reimplantation L TKR  Allergies  Allergen Reactions  . Levofloxacin Itching  . Morphine And Related Itching   Patient Measurements: TBW 131.5 kg  Labs:  Recent Labs  01/01/14 0820 01/02/14 0450 01/03/14 0430  HGB 10.2* 7.8* 6.7*  HCT 30.6* 23.9* 20.8*  PLT 201 168 169  LABPROT 14.4 14.4 16.8*  INR 1.14 1.14 1.40  CREATININE 0.96 0.85 0.71   Medications:  Scheduled:  . aspirin  81 mg Oral Daily  . cefTRIAXone (ROCEPHIN)  IV  2 g Intravenous Q24H  . docusate sodium  100 mg Oral BID  . enoxaparin (LOVENOX) injection  40 mg Subcutaneous Q12H  . ferrous sulfate  325 mg Oral TID PC  . insulin aspart  0-15 Units Subcutaneous TID WC  . insulin aspart protamine- aspart  25 Units Subcutaneous Q supper  . insulin aspart protamine- aspart  33 Units Subcutaneous Q breakfast  . metoprolol  50 mg Oral BID  . oxyCODONE  5-15 mg Oral Q4H  . pantoprazole  80 mg Oral Daily  . polyethylene glycol  17 g Oral BID  . simvastatin  40 mg Oral QPM  . sodium chloride  10-40 mL Intracatheter Q12H  . vancomycin  1,750 mg Intravenous Q24H  . Warfarin - Pharmacist Dosing Inpatient   Does not apply q1800   Assessment:  63 yo F on chronic anticoagulation with warfarin for A.Fib now s/p re-inplantation of left total knee arthroplasty on 4/6. Warfarin dosage PTA = 12.5 mg daily; last dose was on 3/31.  4/9: POD#3 Inpatient warfarin doses administered: 4/6: -- 4/7: 12.5mg  4/8: 15mg   INR 1.4 Hgb 6.7; no overt bleeding reported Pltc WNL SCr WNL, estimated CrCl ~ 90 mL/min Concomitant ASA noted (h/o NSTEMI) Concomitant celecoxib noted (component of multimodal analgesic regimen)  Goal of Therapy:  INR 2-3 Monitor platelets by anticoagulation protocol: Yes  Plan: 1. Warfarin 12.5 mg PO x 1 today at 1800 2. Continue Lovenox as ordered by ortho (40 mg SQ BID). 3. PT/INR daily  while inpatient. 4. Monitor Hgb and bleeding - to get PRBC today 5. Likely discharge to SNF today   Doreene Eland, PharmD, BCPS.   Pager: [phone] 01/03/2014  7:13 AM

## 2014-01-03 NOTE — Progress Notes (Signed)
CRITICAL VALUE ALERT  Critical value received:  HGB 6.7    Date of notification:  01/03/14   Time of notification:  0512  Critical value read back:Yes  Nurse who received alert:  Archie Balboa   MD notified (1st page): Lacie Draft  Time of first page:  0518  Responding MD:  Lacie Draft   Time MD responded:  [phone]

## 2014-01-03 NOTE — Progress Notes (Signed)
Sed rate and crp significantly elevated. May need to extend abtx for 4-6wk rather than just 2 wks. Will decide in follow up appt the need to extend antibiotics.

## 2014-01-03 NOTE — Discharge Summary (Signed)
Physician Discharge Summary  Patient ID: [name] MRN: [id number] DOB/AGE: 05-26-1951 63 y.o.  Admit date: 12/31/2013 Discharge date:  01/03/2014  Procedures:  Procedure(s) (LRB): RE-INPLANTATION LEFT TOTAL KNEE  (Left)  Attending Physician:  Dr. Durene Romans   Admission Diagnoses:   S/P resection of left total knee arthroplasty do to infection   Discharge Diagnoses:  Principal Problem:   S/P left TK revision Active Problems:   Morbid obesity   Expected blood loss anemia  Past Medical History  Diagnosis Date  . Hypertension   . Hypercholesteremia   . Cancer 5/10    LUL Vats   . Lung cancer   . Peripheral vascular disease 8/12    Lt SFA PTA  . Sleep apnea     " mild form ,does not use cpap "  . Diabetes mellitus     insulin dependent  . GERD (gastroesophageal reflux disease)   . H/O hiatal hernia   . Arthritis   . Non-STEMI (non-ST elevated myocardial infarction) 04/07/2012    See cath results below  . Presence of stent in LAD coronary artery 04/07/2012    Xience Expedition DES 2.75 mm x 18 mm (dilated to 3.0 mm)  . Paroxysmal atrial fibrillation 04/07/2012    On Warfarin  . Heart palpitations     monitor 04/19/12-05/10/12- frequent PAF with RVR- coumadin started    HPI: [name], 63 y.o. female, has a history of pain and functional disability in the left knee. The patient reports left knee symptoms including: pain, weakness and stiffness which began 7 year(s) ago following a specific injury (history of left TKA in 2004 per Dr. August Saucer, 2005 left knee scope to remove scar tissue, 2006 left knee scope for scar tissue, 2007 left TKA revision with scar tissue removal. 2013 bimalleolar ORIF of left ankle - she states the ankle fracture made her left knee pain worse, prior to ORIF she did not have to use a walker/cane). She presented to the clinic and with suspicion of infection her total knee arthroplasty was resected and an antibiotic spacer was placed on  11/05/2013. She subsequently had a PICC line placed and was antibiotic for 8 weeks, Dr. Ninetta Lights was following. Upon return to the clinic we have discussed a reimplantation of a total knee arthroplasty. Risks, benefits and expectations were discussed with the patient. Risks including but not limited to the risk of anesthesia, blood clots, nerve damage, blood vessel damage, failure of the prosthesis, infection and up to and including death. We have also discussed possibility that as the surgery progress that if there is still infection in the knee we may wash out the knee and repeat an antibiotic spacer. We would also need to continue treatment with the PICC line and antibiotic, only if infection was still found in the knee. Patient understand the risks, benefits and expectations and wishes to proceed with surgery.   PCP: Georgianne Fick, MD   Discharged Condition: good  Hospital Course:  Patient underwent the above stated procedure on 12/31/2013. Patient tolerated the procedure well and brought to the recovery room in good condition and subsequently to the floor.  POD #1 BP: 136/56 ; Pulse: 102 ; Temp: 99.6 F (37.6 C) ; Resp: 20 Patient reports pain as mild, pain controlled. Complaining of some nausea this morning. Neurovascular intact, dorsiflexion/plantar flexion intact, incision: dressing C/D/I, no cellulitis present and compartment soft.   LABS   Not drawn to do failure of PICC line  POD #2  BP:  157/80 ; Pulse: 89 ; Temp: 99.5 F (37.5 C) ; Resp: 20  Patient reports pain as moderate, pain with movement of the knee. States that she has worked with PT, but hasn't done much. No events throughout the night. Neurovascular intact, dorsiflexion/plantar flexion intact, incision: dressing C/D/I, no cellulitis present and compartment soft.   LABS  Basename    HGB  7.8  HCT  23.9   POD #3  BP: 156/78 ; Pulse: 102 ; Temp: 99.6 F (37.6 C) ; Resp: 18  Patient reports pain as mild, pain controlled.  H&H is 6.7/20.8. Otherwise no events throughout the night. Discussed her lab results and transfusing blood. She realizes that it is under 7 and ok with receiving blood. Denies any CP or SOB. Ready to be discharged to SNF after receiving blood. Neurovascular intact, dorsiflexion/plantar flexion intact, incision: dressing C/D/I, no cellulitis present and compartment soft.   LABS  Basename    HGB  6.7  HCT  20.8   Discharge Exam: General appearance: alert, cooperative and no distress Extremities: Homans sign is negative, no sign of DVT, no edema, redness or tenderness in the calves or thighs and no ulcers, gangrene or trophic changes  Disposition:    Skilled Nursing Facility with follow up in 2 weeks   Follow-up Information   Follow up with Shelda Pal, MD. Schedule an appointment as soon as possible for a visit in 2 weeks.   Specialty:  Orthopedic Surgery   Contact information:   [address] [place] [address] [phone]       Follow up with Judyann Munson, MD. Schedule an appointment as soon as possible for a visit in 2 weeks.   Specialty:  Infectious Diseases   Contact information:   [address] [place] [address] [phone]       Discharge Orders   Future Appointments Provider Department Dept Phone   09/10/2014 9:45 AM [facility] Medical Oncology [phone]   09/10/2014 10:30 AM [facility] [phone]   Liquids only 4 hours prior to your exam. Any medications can be taken as usual. Please arrive 15 min prior to your scheduled exam time.   09/17/2014 11:00 AM Si Gaul, MD [facility] Medical Oncology [phone]   Future Orders Complete By Expires   Call MD / Call 911  As directed    Change dressing  As directed    Constipation Prevention  As directed    Diet - low sodium heart healthy  As directed    Discharge instructions  As  directed    Driving restrictions  As directed    Increase activity slowly as tolerated  As directed    TED hose  As directed    Weight bearing as tolerated  As directed    Questions:     Laterality:     Extremity:          Medication List    STOP taking these medications       cefTRIAXone 2 G Solr injection  Commonly known as:  ROCEPHIN     HYDROcodone-acetaminophen 7.5-325 MG per tablet  Commonly known as:  NORCO     traMADol 50 MG tablet  Commonly known as:  ULTRAM      TAKE these medications       albuterol 108 (90 BASE) MCG/ACT inhaler  Commonly known as:  PROVENTIL HFA;VENTOLIN HFA  Inhale 1-2 puffs  into the lungs every 6 (six) hours as needed for wheezing.     aspirin 81 MG chewable tablet  Chew 81 mg by mouth daily.     cefTRIAXone 2 g in dextrose 5 % 50 mL  Inject 2 g into the vein daily.     cholecalciferol 1000 UNITS tablet  Commonly known as:  VITAMIN D  Take 1,000 Units by mouth at bedtime.     DSS 100 MG Caps  Take 100 mg by mouth 2 (two) times daily.     enoxaparin 40 MG/0.4ML injection  Commonly known as:  LOVENOX  Inject 0.4 mLs (40 mg total) into the skin every 12 (twelve) hours.     ferrous sulfate 325 (65 FE) MG tablet  Take 1 tablet (325 mg total) by mouth 3 (three) times daily after meals.     HEPARIN LOCK FLUSH IJ  3 mLs as directed. 100/ml 3ml flush. In picc line after ABT     insulin aspart protamine- aspart (70-30) 100 UNIT/ML injection  Commonly known as:  NOVOLOG MIX 70/30  Inject 33 Units into the skin daily with breakfast.     insulin aspart protamine- aspart (70-30) 100 UNIT/ML injection  Commonly known as:  NOVOLOG MIX 70/30  Inject 25 Units into the skin daily with supper.     metoCLOPramide 10 MG tablet  Commonly known as:  REGLAN  Take 5-10 mg by mouth as directed. 5 mg in the morning then 10mg  in at bedtime if needed for nausea     metoprolol 50 MG tablet  Commonly known as:  LOPRESSOR  Take 1 tablet (50 mg total)  by mouth 2 (two) times daily.     omeprazole 40 MG capsule  Commonly known as:  PRILOSEC  Take 40 mg by mouth daily.     oxyCODONE 5 MG immediate release tablet  Commonly known as:  Oxy IR/ROXICODONE  Take 1-3 tablets (5-15 mg total) by mouth every 4 (four) hours as needed for severe pain.     polyethylene glycol packet  Commonly known as:  MIRALAX / GLYCOLAX  Take 17 g by mouth 2 (two) times daily.     simvastatin 40 MG tablet  Commonly known as:  ZOCOR  Take 40 mg by mouth every evening.     tiZANidine 4 MG tablet  Commonly known as:  ZANAFLEX  Take 1 tablet (4 mg total) by mouth every 8 (eight) hours as needed.     vancomycin 1,750 mg in sodium chloride 0.9 % 500 mL  Inject 1,750 mg into the vein daily.     VANCOMYCIN HCL IV  Inject 1,750 mg into the vein daily. For 6 weeks. Started 11/09/13     warfarin 5 MG tablet  Commonly known as:  COUMADIN  Take 0.5 tablets (2.5 mg total) by mouth daily. Taken with 10mg  to equal 12.5mg          Signed: Anastasio Auerbach. Antonella Upson   PAC  01/03/2014, 8:48 AM

## 2014-01-04 ENCOUNTER — Other Ambulatory Visit: Payer: Self-pay | Admitting: *Deleted

## 2014-01-04 ENCOUNTER — Non-Acute Institutional Stay (SKILLED_NURSING_FACILITY): Payer: Medicare Other | Admitting: Internal Medicine

## 2014-01-04 DIAGNOSIS — IMO0001 Reserved for inherently not codable concepts without codable children: Secondary | ICD-10-CM

## 2014-01-04 DIAGNOSIS — M009 Pyogenic arthritis, unspecified: Secondary | ICD-10-CM | POA: Diagnosis not present

## 2014-01-04 DIAGNOSIS — D500 Iron deficiency anemia secondary to blood loss (chronic): Secondary | ICD-10-CM | POA: Diagnosis not present

## 2014-01-04 DIAGNOSIS — E1165 Type 2 diabetes mellitus with hyperglycemia: Secondary | ICD-10-CM

## 2014-01-04 LAB — TYPE AND SCREEN
ABO/RH(D): A POS
Antibody Screen: NEGATIVE
UNIT DIVISION: 0
Unit division: 0

## 2014-01-04 MED ORDER — OXYCODONE HCL 5 MG PO TABS: 5.0000 mg | ORAL_TABLET | ORAL | Status: DC | PRN

## 2014-01-04 NOTE — Telephone Encounter (Signed)
Rx faxed to [facility] @ [phone]

## 2014-01-09 ENCOUNTER — Encounter: Payer: Self-pay | Admitting: Infectious Diseases

## 2014-01-11 ENCOUNTER — Non-Acute Institutional Stay (SKILLED_NURSING_FACILITY): Payer: Medicare Other | Admitting: Internal Medicine

## 2014-01-11 DIAGNOSIS — IMO0001 Reserved for inherently not codable concepts without codable children: Secondary | ICD-10-CM

## 2014-01-11 DIAGNOSIS — E1165 Type 2 diabetes mellitus with hyperglycemia: Principal | ICD-10-CM

## 2014-01-13 NOTE — Progress Notes (Signed)
Patient ID: [name], female   DOB: 03-02-1951, 63 y.o.   MRN:                   PROGRESS NOTE  DATE:  01/04/2014   FACILITY: [facility]     LEVEL OF CARE:   SNF   Acute Visit   CHIEF COMPLAINT:  Follow up diabetes, AFib, on Coumadin, and infected left total knee prosthesis.  Admitted to [facility]/6 through 4/9 for reimplantation of the left total knee  HISTORY OF PRESENT ILLNESS:  This is a patient who had a left total knee replacement in 2004 and then a revision in 2007.  She was developing increasing pain in her knee and due to inflammatory markers being elevated, there was a concern for infection.  She underwent removal of the left total knee replacement and then an antibiotic spacer, and she was started on vanc and Rocephin.  She is in SNF completing a six-week course.  Her cultures were negative.    She has now been readmitted for reimplantation of the left total knee prosthesis. There does not seem to have been any major postop issues her hemoglobin dropped to a low of 6.7 at some point and this will need to be rechecked  Discharge medication Albuterol inhaler 2 puffs every 6 hours when necessary Aspirin 81 daily Rocephin 2 g daily Vitamin D 8000 units at bedtime Lovenox 40 mg every 12 Ferrous sulfate 325 3 times daily  Colace 100 twice a day  NovoLog 7030 33 units in the morning 25 units at supper Reglan 5 mg in the morning 10 at bedtime when necessary nausea Lopressor 50 twice a day Prilosec 40 daily Roxicodone every 4 when necessary MiraLax 17 g twice a day Zocor 42 evening Zanaflex 4 mg every 8 hours as needed Vancomycin 1750 daily Coumadin 2.5 daily along with 10 mg the equal 12.5 daily   REVIEW OF SYSTEMS:   GENERAL:  The patient feels well.  She is not in pain.  She is not having any fevers.   ENDOCRINE:  CBGs are reviewed.   Yesterday's were 125, 240, 330.  This is a fairly consistent pattern.  She becomes much higher later in the day to over  300 a.c. supper and at h.s.  She currently receives insulin 70/30, 25 U two times a day.    PHYSICAL EXAMINATION:   GENERAL APPEARANCE:  The patient looks well.  She is not running a fever.   MUSCULOSKELETAL:   EXTREMITIES:   LEFT LOWER EXTREMITY:  Her knee is in an immobilizer.    ASSESSMENT/PLAN:  Type 2 diabetes.  This is poorly controlled.  I do not see a recent hemoglobin A1c.  However, I am going to increase her 70/30 to 33 U in the morning and leave 25 U at night.    Postoperative anemia this will need to be rechecked  Septic arthritis now post reimplantation of her total knee prosthesis. We'll need to see exactly when her antibiotics her supposed to stop

## 2014-01-14 NOTE — Progress Notes (Signed)
Patient ID: [name], female   DOB: 02/02/1951, 63 y.o.   MRN: [id number]                   PROGRESS NOTE  DATE:  01/11/2014    FACILITY: [facility]    LEVEL OF CARE:   SNF   Acute Visit   CHIEF COMPLAINT:  Follow up diabetes.    HISTORY OF PRESENT ILLNESS:  [name] is here recuperating from her left total knee re-implantation.    She is a type 2 diabetic who has been on insulin, but  apparently only a small dose of a short-acting insulin a.c. meals, 5 U.   During one of her hospitalizations, it was noted that her  hemoglobin A1c was 8.7 in February.  It was suggested that she needed long-acting insulin.  However, the cost of Lantus insulin is prohibitive.  Therefore, she was put on 70/30 insulin, 33 U in the morning and 25 U in the evening.    In the facility, however, she has been refusing the morning insulin as her blood sugars are generally in the 90s to 120 range.  As a result, her blood sugars are 200 the rest of the day and I have been asked to talk to her about this.     PHYSICAL EXAMINATION:   GENERAL APPEARANCE:  She generally looks well.    MUSCULOSKELETAL:   EXTREMITIES:   LEFT LOWER EXTREMITY:  She continues to have significant swelling in the left total knee.  However, I do not believe there is active infection here.    ASSESSMENT/PLAN:  Poorly controlled type 2 diabetes.  I have talked to her at some length about the necessity of using 70/30 insulin twice a day.  I will drop the dose down to 25 U to see if we can ensure compliance.   As her hemoglobin A1c in February was 8.7 and seems to have been going up over the years in [facility], I suspect she is going to need much better glucose control.  It is true that the 70/30 insulin is much cheaper than Lantus.  However, we are going to need to make sure that she has twice-daily dosing.    CPT CODE: 11657

## 2014-01-17 ENCOUNTER — Ambulatory Visit (INDEPENDENT_AMBULATORY_CARE_PROVIDER_SITE_OTHER): Payer: Medicare Other | Admitting: Infectious Diseases

## 2014-01-17 ENCOUNTER — Encounter: Payer: Self-pay | Admitting: Infectious Diseases

## 2014-01-17 VITALS — BP 133/74 | HR 76 | Temp 98.2°F | Ht 63.0 in | Wt 283.0 lb

## 2014-01-17 DIAGNOSIS — M869 Osteomyelitis, unspecified: Secondary | ICD-10-CM

## 2014-01-17 DIAGNOSIS — I2510 Atherosclerotic heart disease of native coronary artery without angina pectoris: Secondary | ICD-10-CM | POA: Diagnosis not present

## 2014-01-17 DIAGNOSIS — IMO0001 Reserved for inherently not codable concepts without codable children: Secondary | ICD-10-CM | POA: Diagnosis not present

## 2014-01-17 DIAGNOSIS — E1165 Type 2 diabetes mellitus with hyperglycemia: Secondary | ICD-10-CM

## 2014-01-17 DIAGNOSIS — D649 Anemia, unspecified: Secondary | ICD-10-CM | POA: Diagnosis not present

## 2014-01-17 NOTE — Progress Notes (Signed)
   Subjective:    Patient ID: [name], female    DOB: 20-Apr-1951, 63 y.o.   MRN: [id number]  HPI 63 y.o. female with history of oesity, DM2,a-fib (on coumadin) and OA of left knee s/p left TKA in 2004, 2005 and 2006 left knee arthroscopy to remove scar tissue, and then 2007 left TKA revision with scar tissue removal.  Over 2014, she has had severe pain to her left knee concerning for loosening of hardware. Due to inflammatory markers being elevated, there was concern for subacute infection. She was admitted to [facility] on 11-05-13 and underwent removal L TKR and antibiotic spacer placement.. She was started on vancomycin and ceftriaxone. Her Cx were (-). Her hospital course was complicated by ARF. She was d/c to SNF on 11-08-13. She was placed on 8 wks of vancomycin and ceftriaxone until her re-implantation/revision of left TKA 12/31/13. Her inflammatory markers were still elevated prior to surgery however, on inspection in the OR it did not appear that she had ongoing infection. She does have significant bone loss of both tibia and femoral side from previous surgeries. Due to continued elevation of inflammatory markers, she was continued on her IV anbx post-op.  Today feels like her L knee is getting better. A little sore. PT is going well- Has been bearing wt well but still has pain with 100%.  No f/c.  FSG have been under 200 with few exceptions.   Review of Systems  Constitutional: Negative for fever and chills.  Gastrointestinal: Negative for diarrhea and constipation.  Genitourinary: Negative for difficulty urinating.       Objective:   Physical Exam  Constitutional: She appears well-developed and well-nourished.  Musculoskeletal:       Arms:      Legs:         Assessment & Plan:

## 2014-01-17 NOTE — Assessment & Plan Note (Signed)
Her last h/h is better than in hospital (although still low). 9.6/27.7.  This needs to be addressed by her PCP, will impair wound healing.

## 2014-01-17 NOTE — Assessment & Plan Note (Addendum)
Will check her ESR and CRP at her next blood draw at SNF. I suggested she continue 1 more month of IV anbx (she was not pleased with this).  Will plan on her getting switched to po anbx at the completion of her IV. Her Vanco Tr was 24.6 on 01-15-14, this will be addressed by her home health pharmacy.will plan for her to stop IV anbx 02-16-14 and have her PIC pulled then.   Overall, the outlook for her knee seems less than favorable. Will see her back in 5-6 weeks.  

## 2014-01-17 NOTE — Assessment & Plan Note (Signed)
Good control is key to her healing, she will f/u with her PCP.

## 2014-01-18 ENCOUNTER — Telehealth: Payer: Self-pay | Admitting: *Deleted

## 2014-01-18 NOTE — Telephone Encounter (Signed)
Patient called concerned that she has to remain on the IV antibiotics for another month until 02/16/14. Explained that this in to insure that her infection has resolved. She has a follow up appt 02/26/14 with Dr. Johnnye Sima.

## 2014-01-22 ENCOUNTER — Non-Acute Institutional Stay (SKILLED_NURSING_FACILITY): Payer: Medicare Other | Admitting: Internal Medicine

## 2014-01-22 DIAGNOSIS — M009 Pyogenic arthritis, unspecified: Secondary | ICD-10-CM | POA: Diagnosis not present

## 2014-01-24 NOTE — Progress Notes (Signed)
Patient ID: [name], female   DOB: 1951/07/05, 63 y.o.   MRN: [id number]                  PROGRESS NOTE  DATE:  01/22/2014      FACILITY: [facility]    LEVEL OF CARE:   SNF   Acute Visit   CHIEF COMPLAINT:  Follow up drainage, left knee.    HISTORY OF PRESENT ILLNESS:  This is a patient who had re-implantation of an infected left total knee replacement after a time with an antibiotic spacer.   She has been on vancomycin and Rocephin.  As far as I can see, her cultures were negative.  She went back to see Dr. Johnnye Sima of Infectious Disease.  He noted continued draining from the left knee through a small wound in the mid part of the incision.  This is still draining and he has extended both her vancomycin and Rocephin until 02/16/2014.  Lab work from 01/21/2014 showed a sedimentation rate of 80 and a C.reactive protein of 3.5.  She has also followed up with Orthopaedics within the last week.    PHYSICAL EXAMINATION:   GENERAL APPEARANCE:   The patient is not running a fever.   MUSCULOSKELETAL:   EXTREMITIES:   LEFT LOWER EXTREMITY:  The incision is closed.  However, there is a small draining area roughly 2/3 of the way down the incision towards her foot.  This is draining a sanguineous drainage.  The knee itself is warm, but not overtly tender.   ASSESSMENT/PLAN:  Septic arthritis.  She continues to have elevated inflammatory markers.  Her antibiotics have been extended through 02/16/2014.  I wonder whether she needs an aspirate.  However, she has just seen Orthopaedics.  The overall presentation is somewhat concerning.    CPT CODE: 38882

## 2014-01-25 DIAGNOSIS — K219 Gastro-esophageal reflux disease without esophagitis: Secondary | ICD-10-CM | POA: Diagnosis not present

## 2014-01-25 DIAGNOSIS — I10 Essential (primary) hypertension: Secondary | ICD-10-CM | POA: Diagnosis not present

## 2014-01-25 DIAGNOSIS — I4891 Unspecified atrial fibrillation: Secondary | ICD-10-CM | POA: Diagnosis not present

## 2014-01-25 DIAGNOSIS — K5900 Constipation, unspecified: Secondary | ICD-10-CM | POA: Diagnosis not present

## 2014-01-25 DIAGNOSIS — Z96659 Presence of unspecified artificial knee joint: Secondary | ICD-10-CM | POA: Diagnosis not present

## 2014-01-25 DIAGNOSIS — M6281 Muscle weakness (generalized): Secondary | ICD-10-CM | POA: Diagnosis not present

## 2014-01-25 DIAGNOSIS — R262 Difficulty in walking, not elsewhere classified: Secondary | ICD-10-CM | POA: Diagnosis not present

## 2014-01-25 DIAGNOSIS — Z471 Aftercare following joint replacement surgery: Secondary | ICD-10-CM | POA: Diagnosis not present

## 2014-01-25 DIAGNOSIS — M25569 Pain in unspecified knee: Secondary | ICD-10-CM | POA: Diagnosis not present

## 2014-02-13 ENCOUNTER — Non-Acute Institutional Stay (SKILLED_NURSING_FACILITY): Payer: Medicare Other | Admitting: Internal Medicine

## 2014-02-13 DIAGNOSIS — I4891 Unspecified atrial fibrillation: Secondary | ICD-10-CM

## 2014-02-13 DIAGNOSIS — K219 Gastro-esophageal reflux disease without esophagitis: Secondary | ICD-10-CM | POA: Diagnosis not present

## 2014-02-13 DIAGNOSIS — I480 Paroxysmal atrial fibrillation: Secondary | ICD-10-CM

## 2014-02-13 DIAGNOSIS — M25569 Pain in unspecified knee: Secondary | ICD-10-CM | POA: Diagnosis not present

## 2014-02-13 DIAGNOSIS — Z96659 Presence of unspecified artificial knee joint: Secondary | ICD-10-CM | POA: Diagnosis not present

## 2014-02-13 DIAGNOSIS — I10 Essential (primary) hypertension: Secondary | ICD-10-CM | POA: Diagnosis not present

## 2014-02-13 DIAGNOSIS — K5900 Constipation, unspecified: Secondary | ICD-10-CM

## 2014-02-15 DIAGNOSIS — K219 Gastro-esophageal reflux disease without esophagitis: Secondary | ICD-10-CM | POA: Insufficient documentation

## 2014-02-15 NOTE — Progress Notes (Signed)
Patient ID: [name], female   DOB: [birth year], 63 y.o.   MRN: [id number]                 PROGRESS NOTE  DATE: 02/13/2014        FACILITY: [facility]  LEVEL OF CARE: SNF (31)  Discharge Visit  CHIEF COMPLAINT:  Manage hypertension.    HISTORY OF PRESENT ILLNESS: I was requested by the social worker to perform face-to-face evaluation for discharge:  Patient was admitted to this facility for short-term rehabilitation after the patient's recent hospitalization.  Patient has completed SNF rehabilitation and therapy has cleared the patient for discharge.  Reassessment of ongoing problem(s):  HTN: Pt 's HTN remains stable.  Denies CP, sob, DOE, pedal edema, headaches, dizziness or visual disturbances.  No complications from the medications currently being used.  Last BP :   134/57.    PAST MEDICAL HISTORY : Reviewed.  No changes/see problem list  CURRENT MEDICATIONS: Reviewed per MAR/see medication list  REVIEW OF SYSTEMS:  GENERAL: no change in appetite, no fatigue, no weight changes, no fever, chills or weakness RESPIRATORY: no cough, SOB, DOE, wheezing, hemoptysis CARDIAC: no chest pain, edema or palpitations GI: no abdominal pain, diarrhea, constipation, heart burn, nausea or vomiting  PHYSICAL EXAMINATION  VS:  T 99.2       P 76      RR 20      BP 134/57     POX 98%       WT (Lb) 278       GENERAL: no acute distress, morbidly obese body habitus NECK: supple, trachea midline, no neck masses, no thyroid tenderness, no thyromegaly RESPIRATORY: breathing is even & unlabored, BS CTAB CARDIAC: RRR, no murmur,no extra heart sounds, +3 bilateral lower extremity edema    GI: abdomen soft, normal BS, no masses, no tenderness, no hepatomegaly, no splenomegaly PSYCHIATRIC: the patient is alert & oriented to person, affect & behavior appropriate  LABS/RADIOLOGY: In 12/2013:  BUN 9, creatinine 0.82.    In 11/2013:  TSH 1.48.     ASSESSMENT/PLAN:  Hypertension.  Well controlled.    GERD.  Continue PPI.    Atrial fibrillation.  Rate controlled.    Constipation.  Well controlled.    Acute blood loss anemia.  Continue iron.     Hyperlipidemia.  Continue Zocor.    Status post left total knee revision.  Patient is cleared by Physical Therapy.     I have filled out patient's discharge paperwork and written prescriptions.  Patient will receive home health PT, OT, and RN.   DME provided:   None.    Total discharge time: Less than 30 minutes Discharge time involved coordination of the discharge process with Education officer, museum, nursing staff and therapy department. Medical justification for home health services/DME verified.  CPT CODE: 17711       [name], [place] [phone]

## 2014-02-21 DIAGNOSIS — I4891 Unspecified atrial fibrillation: Secondary | ICD-10-CM | POA: Diagnosis not present

## 2014-02-21 DIAGNOSIS — I10 Essential (primary) hypertension: Secondary | ICD-10-CM | POA: Diagnosis not present

## 2014-02-21 DIAGNOSIS — IMO0002 Reserved for concepts with insufficient information to code with codable children: Secondary | ICD-10-CM | POA: Diagnosis not present

## 2014-02-21 DIAGNOSIS — E1065 Type 1 diabetes mellitus with hyperglycemia: Secondary | ICD-10-CM | POA: Diagnosis not present

## 2014-02-25 DIAGNOSIS — I10 Essential (primary) hypertension: Secondary | ICD-10-CM | POA: Diagnosis not present

## 2014-02-25 DIAGNOSIS — Z471 Aftercare following joint replacement surgery: Secondary | ICD-10-CM | POA: Diagnosis not present

## 2014-02-25 DIAGNOSIS — E119 Type 2 diabetes mellitus without complications: Secondary | ICD-10-CM | POA: Diagnosis not present

## 2014-02-25 DIAGNOSIS — I4891 Unspecified atrial fibrillation: Secondary | ICD-10-CM | POA: Diagnosis not present

## 2014-02-25 DIAGNOSIS — Z794 Long term (current) use of insulin: Secondary | ICD-10-CM | POA: Diagnosis not present

## 2014-02-25 DIAGNOSIS — Z7901 Long term (current) use of anticoagulants: Secondary | ICD-10-CM | POA: Diagnosis not present

## 2014-02-25 DIAGNOSIS — Z96659 Presence of unspecified artificial knee joint: Secondary | ICD-10-CM | POA: Diagnosis not present

## 2014-02-26 ENCOUNTER — Encounter: Payer: Self-pay | Admitting: Infectious Diseases

## 2014-02-26 ENCOUNTER — Ambulatory Visit (INDEPENDENT_AMBULATORY_CARE_PROVIDER_SITE_OTHER): Payer: Medicare Other | Admitting: Infectious Diseases

## 2014-02-26 VITALS — BP 144/72 | HR 77 | Temp 98.3°F | Wt 266.0 lb

## 2014-02-26 DIAGNOSIS — I2510 Atherosclerotic heart disease of native coronary artery without angina pectoris: Secondary | ICD-10-CM | POA: Diagnosis not present

## 2014-02-26 DIAGNOSIS — IMO0001 Reserved for inherently not codable concepts without codable children: Secondary | ICD-10-CM

## 2014-02-26 DIAGNOSIS — M869 Osteomyelitis, unspecified: Secondary | ICD-10-CM

## 2014-02-26 DIAGNOSIS — E1165 Type 2 diabetes mellitus with hyperglycemia: Secondary | ICD-10-CM

## 2014-02-26 MED ORDER — DOXYCYCLINE CALCIUM 50 MG/5ML PO SYRP: 100.0000 mg | ORAL_SOLUTION | Freq: Two times a day (BID) | ORAL | Status: DC

## 2014-02-26 NOTE — Progress Notes (Signed)
   Subjective:    Patient ID: [name], female    DOB: 1951-04-30, 63 y.o.   MRN: [id number]  HPI 63 y.o. female with history of oesity, DM2,a-fib (on coumadin) and OA of left knee s/p left TKA in 2004, 2005 and 2006 left knee arthroscopy to remove scar tissue, and then 2007 left TKA revision with scar tissue removal.  Over 2014, she has had severe pain to her left knee concerning for loosening of hardware. Due to inflammatory markers being elevated, there was concern for subacute infection. She was admitted to [facility] on 11-05-13 and underwent removal L TKR and antibiotic spacer placement.. She was started on vancomycin and ceftriaxone. Her Cx were (-). Her hospital course was complicated by ARF. She was d/c to SNF on 11-08-13. She was placed on 8 wks of vancomycin and ceftriaxone until her re-implantation/revision of left TKA 12/31/13. Her inflammatory markers were still elevated prior to surgery however, on inspection in the OR it did not appear that she had ongoing infection. She does have significant bone loss of both tibia and femoral side from previous surgeries. Due to continued elevation of inflammatory markers, she was continued on her IV anbx post-op. CRP 13.5, ESR 80 on 01-21-14.  She completed her IV anbx 02-15-14, PIC pulled on 02-17-14, and was d/c from SNF on 02-19-14. She continues to have significant soreness of her knee. Is affecting her sleep. Can bear weight but still needs walker. Will start home PT 02-27-14.  Swelling has improved, using ice on her leg. No fever or chills.  Has f/u with dr Alvan Dame in July.    Review of Systems  Gastrointestinal: Positive for constipation. Negative for diarrhea.  Genitourinary: Negative for difficulty urinating.  no yeast infections.      Objective:   Physical Exam  Constitutional: She appears well-developed and well-nourished.  HENT:  Mouth/Throat: No oropharyngeal exudate.  Eyes: EOM are normal. Pupils are equal, round, and reactive to light.    Cardiovascular: Normal rate, regular rhythm and normal heart sounds.   Pulmonary/Chest: Effort normal and breath sounds normal.  Abdominal: Soft. Bowel sounds are normal. She exhibits no distension. There is no tenderness.  Musculoskeletal:       Arms:      Legs:         Assessment & Plan:

## 2014-02-26 NOTE — Assessment & Plan Note (Addendum)
Will check her ESR and CRP. Will start her on oral anbx (she wants small pills or liquid).  Will see her back in ~ 2 months to see how she is tolerating po anbx, f/u her inflammatory markers and see what her ortho eval showed.  I doubt that she has a DVT in her RUE- she is on coumadin. Will hold on checking u/s.  Ask her to check in with her PCP regarding starting doxy with her coumadin.  

## 2014-02-26 NOTE — Assessment & Plan Note (Signed)
She states that her FSG have been better since her insulin has been changed.

## 2014-02-26 NOTE — Addendum Note (Signed)
Addended by: Dolan Amen D on: 02/26/2014 04:36 PM   Modules accepted: Orders

## 2014-02-27 ENCOUNTER — Ambulatory Visit (INDEPENDENT_AMBULATORY_CARE_PROVIDER_SITE_OTHER): Payer: Medicare Other | Admitting: Pharmacist Clinician (PhC)/ Clinical Pharmacy Specialist

## 2014-02-27 DIAGNOSIS — Z7901 Long term (current) use of anticoagulants: Secondary | ICD-10-CM | POA: Diagnosis not present

## 2014-02-27 DIAGNOSIS — I4891 Unspecified atrial fibrillation: Secondary | ICD-10-CM

## 2014-02-27 DIAGNOSIS — I480 Paroxysmal atrial fibrillation: Secondary | ICD-10-CM

## 2014-02-27 LAB — SEDIMENTATION RATE: Sed Rate: 61 mm/hr — ABNORMAL HIGH (ref 0–22)

## 2014-02-27 LAB — C-REACTIVE PROTEIN: CRP: 1.1 mg/dL — ABNORMAL HIGH (ref <0.60)

## 2014-02-27 LAB — POCT INR: INR: 5.3

## 2014-02-27 MED ORDER — WARFARIN SODIUM 7.5 MG PO TABS: ORAL_TABLET | ORAL | Status: DC

## 2014-02-28 ENCOUNTER — Other Ambulatory Visit: Payer: Self-pay | Admitting: Gynecology

## 2014-02-28 DIAGNOSIS — Z794 Long term (current) use of insulin: Secondary | ICD-10-CM | POA: Diagnosis not present

## 2014-02-28 DIAGNOSIS — E119 Type 2 diabetes mellitus without complications: Secondary | ICD-10-CM | POA: Diagnosis not present

## 2014-02-28 DIAGNOSIS — I4891 Unspecified atrial fibrillation: Secondary | ICD-10-CM | POA: Diagnosis not present

## 2014-02-28 DIAGNOSIS — Z471 Aftercare following joint replacement surgery: Secondary | ICD-10-CM | POA: Diagnosis not present

## 2014-02-28 DIAGNOSIS — I10 Essential (primary) hypertension: Secondary | ICD-10-CM | POA: Diagnosis not present

## 2014-02-28 DIAGNOSIS — Z1231 Encounter for screening mammogram for malignant neoplasm of breast: Secondary | ICD-10-CM

## 2014-02-28 DIAGNOSIS — Z96659 Presence of unspecified artificial knee joint: Secondary | ICD-10-CM | POA: Diagnosis not present

## 2014-03-01 DIAGNOSIS — I10 Essential (primary) hypertension: Secondary | ICD-10-CM | POA: Diagnosis not present

## 2014-03-01 DIAGNOSIS — Z794 Long term (current) use of insulin: Secondary | ICD-10-CM | POA: Diagnosis not present

## 2014-03-01 DIAGNOSIS — E119 Type 2 diabetes mellitus without complications: Secondary | ICD-10-CM | POA: Diagnosis not present

## 2014-03-01 DIAGNOSIS — Z96659 Presence of unspecified artificial knee joint: Secondary | ICD-10-CM | POA: Diagnosis not present

## 2014-03-01 DIAGNOSIS — Z471 Aftercare following joint replacement surgery: Secondary | ICD-10-CM | POA: Diagnosis not present

## 2014-03-01 DIAGNOSIS — I4891 Unspecified atrial fibrillation: Secondary | ICD-10-CM | POA: Diagnosis not present

## 2014-03-04 DIAGNOSIS — Z794 Long term (current) use of insulin: Secondary | ICD-10-CM | POA: Diagnosis not present

## 2014-03-04 DIAGNOSIS — I4891 Unspecified atrial fibrillation: Secondary | ICD-10-CM | POA: Diagnosis not present

## 2014-03-04 DIAGNOSIS — Z96659 Presence of unspecified artificial knee joint: Secondary | ICD-10-CM | POA: Diagnosis not present

## 2014-03-04 DIAGNOSIS — E119 Type 2 diabetes mellitus without complications: Secondary | ICD-10-CM | POA: Diagnosis not present

## 2014-03-04 DIAGNOSIS — I10 Essential (primary) hypertension: Secondary | ICD-10-CM | POA: Diagnosis not present

## 2014-03-04 DIAGNOSIS — Z471 Aftercare following joint replacement surgery: Secondary | ICD-10-CM | POA: Diagnosis not present

## 2014-03-05 DIAGNOSIS — Z471 Aftercare following joint replacement surgery: Secondary | ICD-10-CM | POA: Diagnosis not present

## 2014-03-05 DIAGNOSIS — Z794 Long term (current) use of insulin: Secondary | ICD-10-CM | POA: Diagnosis not present

## 2014-03-05 DIAGNOSIS — I4891 Unspecified atrial fibrillation: Secondary | ICD-10-CM | POA: Diagnosis not present

## 2014-03-05 DIAGNOSIS — Z96659 Presence of unspecified artificial knee joint: Secondary | ICD-10-CM | POA: Diagnosis not present

## 2014-03-05 DIAGNOSIS — E119 Type 2 diabetes mellitus without complications: Secondary | ICD-10-CM | POA: Diagnosis not present

## 2014-03-05 DIAGNOSIS — I10 Essential (primary) hypertension: Secondary | ICD-10-CM | POA: Diagnosis not present

## 2014-03-06 ENCOUNTER — Ambulatory Visit (INDEPENDENT_AMBULATORY_CARE_PROVIDER_SITE_OTHER): Payer: Medicare Other | Admitting: Pharmacist Clinician (PhC)/ Clinical Pharmacy Specialist

## 2014-03-06 DIAGNOSIS — Z7901 Long term (current) use of anticoagulants: Secondary | ICD-10-CM | POA: Diagnosis not present

## 2014-03-06 DIAGNOSIS — Z471 Aftercare following joint replacement surgery: Secondary | ICD-10-CM | POA: Diagnosis not present

## 2014-03-06 DIAGNOSIS — Z96659 Presence of unspecified artificial knee joint: Secondary | ICD-10-CM | POA: Diagnosis not present

## 2014-03-06 DIAGNOSIS — I480 Paroxysmal atrial fibrillation: Secondary | ICD-10-CM

## 2014-03-06 DIAGNOSIS — I10 Essential (primary) hypertension: Secondary | ICD-10-CM | POA: Diagnosis not present

## 2014-03-06 DIAGNOSIS — I4891 Unspecified atrial fibrillation: Secondary | ICD-10-CM

## 2014-03-06 DIAGNOSIS — Z794 Long term (current) use of insulin: Secondary | ICD-10-CM | POA: Diagnosis not present

## 2014-03-06 DIAGNOSIS — E119 Type 2 diabetes mellitus without complications: Secondary | ICD-10-CM | POA: Diagnosis not present

## 2014-03-06 LAB — POCT INR: INR: 1.6

## 2014-03-07 DIAGNOSIS — Z471 Aftercare following joint replacement surgery: Secondary | ICD-10-CM | POA: Diagnosis not present

## 2014-03-07 DIAGNOSIS — Z794 Long term (current) use of insulin: Secondary | ICD-10-CM | POA: Diagnosis not present

## 2014-03-07 DIAGNOSIS — I10 Essential (primary) hypertension: Secondary | ICD-10-CM | POA: Diagnosis not present

## 2014-03-07 DIAGNOSIS — E119 Type 2 diabetes mellitus without complications: Secondary | ICD-10-CM | POA: Diagnosis not present

## 2014-03-07 DIAGNOSIS — I4891 Unspecified atrial fibrillation: Secondary | ICD-10-CM | POA: Diagnosis not present

## 2014-03-07 DIAGNOSIS — Z96659 Presence of unspecified artificial knee joint: Secondary | ICD-10-CM | POA: Diagnosis not present

## 2014-03-08 DIAGNOSIS — Z471 Aftercare following joint replacement surgery: Secondary | ICD-10-CM | POA: Diagnosis not present

## 2014-03-08 DIAGNOSIS — E119 Type 2 diabetes mellitus without complications: Secondary | ICD-10-CM | POA: Diagnosis not present

## 2014-03-08 DIAGNOSIS — Z794 Long term (current) use of insulin: Secondary | ICD-10-CM | POA: Diagnosis not present

## 2014-03-08 DIAGNOSIS — I10 Essential (primary) hypertension: Secondary | ICD-10-CM | POA: Diagnosis not present

## 2014-03-08 DIAGNOSIS — I4891 Unspecified atrial fibrillation: Secondary | ICD-10-CM | POA: Diagnosis not present

## 2014-03-08 DIAGNOSIS — Z96659 Presence of unspecified artificial knee joint: Secondary | ICD-10-CM | POA: Diagnosis not present

## 2014-03-11 ENCOUNTER — Ambulatory Visit (HOSPITAL_COMMUNITY): Payer: Medicare Other

## 2014-03-11 DIAGNOSIS — E119 Type 2 diabetes mellitus without complications: Secondary | ICD-10-CM | POA: Diagnosis not present

## 2014-03-11 DIAGNOSIS — I4891 Unspecified atrial fibrillation: Secondary | ICD-10-CM | POA: Diagnosis not present

## 2014-03-11 DIAGNOSIS — Z96659 Presence of unspecified artificial knee joint: Secondary | ICD-10-CM | POA: Diagnosis not present

## 2014-03-11 DIAGNOSIS — Z794 Long term (current) use of insulin: Secondary | ICD-10-CM | POA: Diagnosis not present

## 2014-03-11 DIAGNOSIS — I10 Essential (primary) hypertension: Secondary | ICD-10-CM | POA: Diagnosis not present

## 2014-03-11 DIAGNOSIS — Z471 Aftercare following joint replacement surgery: Secondary | ICD-10-CM | POA: Diagnosis not present

## 2014-03-12 DIAGNOSIS — Z471 Aftercare following joint replacement surgery: Secondary | ICD-10-CM | POA: Diagnosis not present

## 2014-03-12 DIAGNOSIS — I10 Essential (primary) hypertension: Secondary | ICD-10-CM | POA: Diagnosis not present

## 2014-03-12 DIAGNOSIS — E119 Type 2 diabetes mellitus without complications: Secondary | ICD-10-CM | POA: Diagnosis not present

## 2014-03-12 DIAGNOSIS — I4891 Unspecified atrial fibrillation: Secondary | ICD-10-CM | POA: Diagnosis not present

## 2014-03-12 DIAGNOSIS — Z96659 Presence of unspecified artificial knee joint: Secondary | ICD-10-CM | POA: Diagnosis not present

## 2014-03-12 DIAGNOSIS — Z794 Long term (current) use of insulin: Secondary | ICD-10-CM | POA: Diagnosis not present

## 2014-03-13 DIAGNOSIS — Z794 Long term (current) use of insulin: Secondary | ICD-10-CM | POA: Diagnosis not present

## 2014-03-13 DIAGNOSIS — E119 Type 2 diabetes mellitus without complications: Secondary | ICD-10-CM | POA: Diagnosis not present

## 2014-03-13 DIAGNOSIS — I10 Essential (primary) hypertension: Secondary | ICD-10-CM | POA: Diagnosis not present

## 2014-03-13 DIAGNOSIS — I4891 Unspecified atrial fibrillation: Secondary | ICD-10-CM | POA: Diagnosis not present

## 2014-03-13 DIAGNOSIS — Z471 Aftercare following joint replacement surgery: Secondary | ICD-10-CM | POA: Diagnosis not present

## 2014-03-13 DIAGNOSIS — Z96659 Presence of unspecified artificial knee joint: Secondary | ICD-10-CM | POA: Diagnosis not present

## 2014-03-14 DIAGNOSIS — Z471 Aftercare following joint replacement surgery: Secondary | ICD-10-CM | POA: Diagnosis not present

## 2014-03-14 DIAGNOSIS — Z794 Long term (current) use of insulin: Secondary | ICD-10-CM | POA: Diagnosis not present

## 2014-03-14 DIAGNOSIS — I4891 Unspecified atrial fibrillation: Secondary | ICD-10-CM | POA: Diagnosis not present

## 2014-03-14 DIAGNOSIS — Z96659 Presence of unspecified artificial knee joint: Secondary | ICD-10-CM | POA: Diagnosis not present

## 2014-03-14 DIAGNOSIS — I10 Essential (primary) hypertension: Secondary | ICD-10-CM | POA: Diagnosis not present

## 2014-03-14 DIAGNOSIS — E119 Type 2 diabetes mellitus without complications: Secondary | ICD-10-CM | POA: Diagnosis not present

## 2014-03-18 DIAGNOSIS — Z471 Aftercare following joint replacement surgery: Secondary | ICD-10-CM | POA: Diagnosis not present

## 2014-03-18 DIAGNOSIS — Z96659 Presence of unspecified artificial knee joint: Secondary | ICD-10-CM | POA: Diagnosis not present

## 2014-03-18 DIAGNOSIS — I4891 Unspecified atrial fibrillation: Secondary | ICD-10-CM | POA: Diagnosis not present

## 2014-03-18 DIAGNOSIS — Z794 Long term (current) use of insulin: Secondary | ICD-10-CM | POA: Diagnosis not present

## 2014-03-18 DIAGNOSIS — E119 Type 2 diabetes mellitus without complications: Secondary | ICD-10-CM | POA: Diagnosis not present

## 2014-03-18 DIAGNOSIS — I10 Essential (primary) hypertension: Secondary | ICD-10-CM | POA: Diagnosis not present

## 2014-03-19 DIAGNOSIS — I4891 Unspecified atrial fibrillation: Secondary | ICD-10-CM | POA: Diagnosis not present

## 2014-03-19 DIAGNOSIS — Z96659 Presence of unspecified artificial knee joint: Secondary | ICD-10-CM | POA: Diagnosis not present

## 2014-03-19 DIAGNOSIS — Z471 Aftercare following joint replacement surgery: Secondary | ICD-10-CM | POA: Diagnosis not present

## 2014-03-19 DIAGNOSIS — E119 Type 2 diabetes mellitus without complications: Secondary | ICD-10-CM | POA: Diagnosis not present

## 2014-03-19 DIAGNOSIS — I10 Essential (primary) hypertension: Secondary | ICD-10-CM | POA: Diagnosis not present

## 2014-03-19 DIAGNOSIS — Z794 Long term (current) use of insulin: Secondary | ICD-10-CM | POA: Diagnosis not present

## 2014-03-20 ENCOUNTER — Ambulatory Visit (INDEPENDENT_AMBULATORY_CARE_PROVIDER_SITE_OTHER): Payer: Medicare Other | Admitting: Pharmacist Clinician (PhC)/ Clinical Pharmacy Specialist

## 2014-03-20 DIAGNOSIS — I4891 Unspecified atrial fibrillation: Secondary | ICD-10-CM | POA: Diagnosis not present

## 2014-03-20 DIAGNOSIS — Z7901 Long term (current) use of anticoagulants: Secondary | ICD-10-CM | POA: Diagnosis not present

## 2014-03-20 DIAGNOSIS — I480 Paroxysmal atrial fibrillation: Secondary | ICD-10-CM

## 2014-03-20 DIAGNOSIS — Z96659 Presence of unspecified artificial knee joint: Secondary | ICD-10-CM | POA: Diagnosis not present

## 2014-03-20 DIAGNOSIS — I10 Essential (primary) hypertension: Secondary | ICD-10-CM | POA: Diagnosis not present

## 2014-03-20 DIAGNOSIS — Z471 Aftercare following joint replacement surgery: Secondary | ICD-10-CM | POA: Diagnosis not present

## 2014-03-20 DIAGNOSIS — E119 Type 2 diabetes mellitus without complications: Secondary | ICD-10-CM | POA: Diagnosis not present

## 2014-03-20 DIAGNOSIS — Z794 Long term (current) use of insulin: Secondary | ICD-10-CM | POA: Diagnosis not present

## 2014-03-20 LAB — POCT INR: INR: 2.5

## 2014-03-21 DIAGNOSIS — Z471 Aftercare following joint replacement surgery: Secondary | ICD-10-CM | POA: Diagnosis not present

## 2014-03-21 DIAGNOSIS — Z96659 Presence of unspecified artificial knee joint: Secondary | ICD-10-CM | POA: Diagnosis not present

## 2014-03-21 DIAGNOSIS — E119 Type 2 diabetes mellitus without complications: Secondary | ICD-10-CM | POA: Diagnosis not present

## 2014-03-21 DIAGNOSIS — I4891 Unspecified atrial fibrillation: Secondary | ICD-10-CM | POA: Diagnosis not present

## 2014-03-21 DIAGNOSIS — Z794 Long term (current) use of insulin: Secondary | ICD-10-CM | POA: Diagnosis not present

## 2014-03-21 DIAGNOSIS — I10 Essential (primary) hypertension: Secondary | ICD-10-CM | POA: Diagnosis not present

## 2014-03-27 DIAGNOSIS — E119 Type 2 diabetes mellitus without complications: Secondary | ICD-10-CM | POA: Diagnosis not present

## 2014-03-27 DIAGNOSIS — I4891 Unspecified atrial fibrillation: Secondary | ICD-10-CM | POA: Diagnosis not present

## 2014-03-27 DIAGNOSIS — Z471 Aftercare following joint replacement surgery: Secondary | ICD-10-CM | POA: Diagnosis not present

## 2014-03-27 DIAGNOSIS — Z96659 Presence of unspecified artificial knee joint: Secondary | ICD-10-CM | POA: Diagnosis not present

## 2014-03-27 DIAGNOSIS — I10 Essential (primary) hypertension: Secondary | ICD-10-CM | POA: Diagnosis not present

## 2014-03-27 DIAGNOSIS — Z794 Long term (current) use of insulin: Secondary | ICD-10-CM | POA: Diagnosis not present

## 2014-04-09 ENCOUNTER — Ambulatory Visit (HOSPITAL_COMMUNITY): Payer: Medicare Other

## 2014-04-12 ENCOUNTER — Ambulatory Visit (HOSPITAL_COMMUNITY)
Admission: RE | Admit: 2014-04-12 | Discharge: 2014-04-12 | Disposition: A | Discharge status: Home / Self Care | Payer: Medicare Other | Source: Ambulatory Visit | Attending: Gynecology | Admitting: Gynecology

## 2014-04-12 DIAGNOSIS — Z1231 Encounter for screening mammogram for malignant neoplasm of breast: Secondary | ICD-10-CM | POA: Diagnosis not present

## 2014-04-12 DIAGNOSIS — E1139 Type 2 diabetes mellitus with other diabetic ophthalmic complication: Secondary | ICD-10-CM | POA: Diagnosis not present

## 2014-04-12 DIAGNOSIS — E11329 Type 2 diabetes mellitus with mild nonproliferative diabetic retinopathy without macular edema: Secondary | ICD-10-CM | POA: Diagnosis not present

## 2014-04-15 DIAGNOSIS — M25569 Pain in unspecified knee: Secondary | ICD-10-CM | POA: Diagnosis not present

## 2014-04-17 ENCOUNTER — Ambulatory Visit: Payer: Medicare Other | Admitting: Pharmacist Clinician (PhC)/ Clinical Pharmacy Specialist

## 2014-04-19 ENCOUNTER — Ambulatory Visit (INDEPENDENT_AMBULATORY_CARE_PROVIDER_SITE_OTHER): Payer: Medicare Other | Admitting: Pharmacist Clinician (PhC)/ Clinical Pharmacy Specialist

## 2014-04-19 DIAGNOSIS — Z7901 Long term (current) use of anticoagulants: Secondary | ICD-10-CM

## 2014-04-19 DIAGNOSIS — I4891 Unspecified atrial fibrillation: Secondary | ICD-10-CM

## 2014-04-19 DIAGNOSIS — I480 Paroxysmal atrial fibrillation: Secondary | ICD-10-CM

## 2014-04-19 DIAGNOSIS — M25569 Pain in unspecified knee: Secondary | ICD-10-CM | POA: Diagnosis not present

## 2014-04-19 LAB — POCT INR: INR: 2.4

## 2014-04-23 DIAGNOSIS — M25569 Pain in unspecified knee: Secondary | ICD-10-CM | POA: Diagnosis not present

## 2014-04-25 DIAGNOSIS — M25569 Pain in unspecified knee: Secondary | ICD-10-CM | POA: Diagnosis not present

## 2014-04-30 ENCOUNTER — Ambulatory Visit (HOSPITAL_COMMUNITY)
Admission: RE | Admit: 2014-04-30 | Discharge: 2014-04-30 | Disposition: A | Discharge status: Home / Self Care | Payer: Medicare Other | Source: Ambulatory Visit | Attending: Internal Medicine | Admitting: Internal Medicine

## 2014-04-30 ENCOUNTER — Other Ambulatory Visit (HOSPITAL_COMMUNITY): Payer: Self-pay | Admitting: Orthopedic Surgery

## 2014-04-30 DIAGNOSIS — Z96659 Presence of unspecified artificial knee joint: Secondary | ICD-10-CM

## 2014-04-30 NOTE — Progress Notes (Signed)
Left Lower Extremity Venous Duplex Completed. No evidence for DVT or SVT. °Brianna L Mazza,RVT °

## 2014-05-07 DIAGNOSIS — M25569 Pain in unspecified knee: Secondary | ICD-10-CM | POA: Diagnosis not present

## 2014-05-08 ENCOUNTER — Telehealth (HOSPITAL_COMMUNITY): Payer: Self-pay | Admitting: *Deleted

## 2014-05-09 ENCOUNTER — Other Ambulatory Visit: Payer: Self-pay | Admitting: Cardiovascular Disease

## 2014-05-09 NOTE — Telephone Encounter (Signed)
RX E-SENT

## 2014-05-10 DIAGNOSIS — M25569 Pain in unspecified knee: Secondary | ICD-10-CM | POA: Diagnosis not present

## 2014-05-13 DIAGNOSIS — M25569 Pain in unspecified knee: Secondary | ICD-10-CM | POA: Diagnosis not present

## 2014-05-15 ENCOUNTER — Ambulatory Visit: Payer: Medicare Other | Admitting: Infectious Diseases

## 2014-05-16 ENCOUNTER — Ambulatory Visit: Payer: Medicare Other | Admitting: Infectious Diseases

## 2014-05-17 ENCOUNTER — Ambulatory Visit: Payer: Medicare Other | Admitting: Pharmacist Clinician (PhC)/ Clinical Pharmacy Specialist

## 2014-05-17 ENCOUNTER — Encounter: Payer: Self-pay | Admitting: Cardiology

## 2014-05-17 ENCOUNTER — Ambulatory Visit (INDEPENDENT_AMBULATORY_CARE_PROVIDER_SITE_OTHER): Payer: Medicare Other | Admitting: Pharmacist Clinician (PhC)/ Clinical Pharmacy Specialist

## 2014-05-17 ENCOUNTER — Ambulatory Visit (INDEPENDENT_AMBULATORY_CARE_PROVIDER_SITE_OTHER): Payer: Medicare Other | Admitting: Cardiology

## 2014-05-17 VITALS — BP 138/78 | HR 70 | Ht 63.0 in | Wt 260.1 lb

## 2014-05-17 DIAGNOSIS — I480 Paroxysmal atrial fibrillation: Secondary | ICD-10-CM

## 2014-05-17 DIAGNOSIS — R1013 Epigastric pain: Secondary | ICD-10-CM | POA: Diagnosis not present

## 2014-05-17 DIAGNOSIS — I4891 Unspecified atrial fibrillation: Secondary | ICD-10-CM

## 2014-05-17 DIAGNOSIS — R9431 Abnormal electrocardiogram [ECG] [EKG]: Secondary | ICD-10-CM | POA: Diagnosis not present

## 2014-05-17 DIAGNOSIS — Z7901 Long term (current) use of anticoagulants: Secondary | ICD-10-CM

## 2014-05-17 DIAGNOSIS — R079 Chest pain, unspecified: Secondary | ICD-10-CM

## 2014-05-17 DIAGNOSIS — K3189 Other diseases of stomach and duodenum: Secondary | ICD-10-CM | POA: Diagnosis not present

## 2014-05-17 DIAGNOSIS — K30 Functional dyspepsia: Secondary | ICD-10-CM

## 2014-05-17 DIAGNOSIS — I2510 Atherosclerotic heart disease of native coronary artery without angina pectoris: Secondary | ICD-10-CM

## 2014-05-17 DIAGNOSIS — M25569 Pain in unspecified knee: Secondary | ICD-10-CM | POA: Diagnosis not present

## 2014-05-17 LAB — POCT INR: INR: 3.5

## 2014-05-17 MED ORDER — PANTOPRAZOLE SODIUM 40 MG PO TBEC: 40.0000 mg | DELAYED_RELEASE_TABLET | Freq: Every day | ORAL | Status: DC

## 2014-05-17 NOTE — Patient Instructions (Signed)
Your physician has requested that you have a lexiscan myoview. For further information please visit [email or web address]. Please follow instruction sheet, as given.   STOP PRILOSEC START PROTONIX (PANTOPRAZOLE ) 40 MG ONCE A DAY IN MORNING.  Keep appointment with DR Croitoru , if test is abnormal we will schedule a earlier appointment

## 2014-05-17 NOTE — Progress Notes (Signed)
05/17/2014 [name]   28-Jun-1951  [id number]  Primary Physicia Georgianne Fick, MD Primary Cardiologist: Margo Aye  HPI:  The patient is a 63 year old African American female, followed by Dr. [name], who presents to clinic today for evaluation of chest pain. She has a known history of CAD, status post PCI plus drug-eluting stenting to her LAD on 04/07/2012. She also has PVD, status post left proximal and mid SFA [place] atherectomy   in 2012. Her other problems include hypertension, obesity, diabetes, GERD and paroxysmal atrial fibrillation for which she takes warfarin. Her INRs are followed in our clinic.  She notes her recent symptoms started roughly 1 month ago. There is some similarity in her discomfort compared to her angina in 2013, however symptoms are not as severe. Described as substernal to left chest discomfort. Pressure like. Not worse with exertion. Worse after eating fried foods. No assocaited dsypnea, diaphoresis, palpitations, dizziness, syncope/near syncope. She does note associated belching. 2/10 in maximum intensity. Last for 1 min or so before spontaneously resolving. Compliant with medications. On Prilosec for GERD.  Her EKG shows notable Twave changes compared to prior EKG in 2014. She had notable Twave flattening in the lateral leads. BP and HR both are stable.    Current Outpatient Prescriptions  Medication Sig Dispense Refill  . aspirin 81 MG chewable tablet Chew 81 mg by mouth daily.      . cholecalciferol (VITAMIN D) 1000 UNITS tablet Take 1,000 Units by mouth at bedtime.       Marland Kitchen doxycycline (VIBRAMYCIN) 50 MG/5ML SYRP Take 10 mLs (100 mg total) by mouth 2 (two) times daily.  400 mL  6  . insulin aspart protamine- aspart (NOVOLOG MIX 70/30) (70-30) 100 UNIT/ML injection 25 UNITS IN AM AND 25 UNITS INEVENING      . lisinopril-hydrochlorothiazide (PRINZIDE,ZESTORETIC) 20-25 MG per tablet Take 1 tablet by mouth daily.      . metoCLOPramide (REGLAN) 10 MG  tablet Take 5-10 mg by mouth as directed. 5 mg in the morning then 10mg  in at bedtime if needed for nausea      . metoprolol (LOPRESSOR) 50 MG tablet Take 1 tablet (50 mg total) by mouth 2 (two) times daily.  60 tablet  5  . omeprazole (PRILOSEC) 40 MG capsule Take 40 mg by mouth daily.      . simvastatin (ZOCOR) 40 MG tablet Take 40 mg by mouth every evening.      . warfarin (COUMADIN) 7.5 MG tablet TAKE 1 TO 1.5 TABLETS BY MOUTH DAILY AS DIRECTED  40 tablet  3  . albuterol (PROVENTIL HFA;VENTOLIN HFA) 108 (90 BASE) MCG/ACT inhaler Inhale 1-2 puffs into the lungs every 6 (six) hours as needed for wheezing.  1 Inhaler  0   No current facility-administered medications for this visit.    Allergies  Allergen Reactions  . Levofloxacin Itching  . Morphine And Related Itching    History   Social History  . Marital Status: Single    Spouse Name: N/A    Number of Children: N/A  . Years of Education: N/A   Occupational History  . Not on file.   Social History Main Topics  . Smoking status: Former Smoker -- 1.00 packs/day for 20 years    Types: Cigarettes    Quit date: 09/27/1984  . Smokeless tobacco: Never Used  . Alcohol Use: No  . Drug Use: No  . Sexual Activity: Not on file   Other Topics Concern  . Not on  file   Social History Narrative  . No narrative on file     Review of Systems: General: negative for chills, fever, night sweats or weight changes.  Cardiovascular: negative for chest pain, dyspnea on exertion, edema, orthopnea, palpitations, paroxysmal nocturnal dyspnea or shortness of breath Dermatological: negative for rash Respiratory: negative for cough or wheezing Urologic: negative for hematuria Abdominal: negative for nausea, vomiting, diarrhea, bright red blood per rectum, melena, or hematemesis Neurologic: negative for visual changes, syncope, or dizziness All other systems reviewed and are otherwise negative except as noted above.    Blood pressure  138/78, pulse 70, height 5\' 3"  (1.6 m), weight 260 lb 1.6 oz (117.981 kg).  General appearance: alert, cooperative, no distress and morbidly obese Neck: no carotid bruit, no JVD Lungs: clear to auscultation bilaterally Heart: regular rate and rhythm, S1, S2 normal, no murmur, click, rub or gallop Extremities: no LEE Pulses: 2+ and symmetric Skin: Skin color, texture, turgor normal. No rashes or lesions Neurologic: Grossly normal  EKG NSR with nonspecific ST and T wave abnormality (notable Twave flattening in V4-V5. New compared to prior EKG.   ASSESSMENT AND PLAN:   1. Chest pain/ indigestion: she is endorsing mixed typical and atypical features. She has a known history of CAD status post PCI to the LAD in 2013. She also has other multiple cardiac risk factors including obesity, hypertension, hyperlipidemia and diabetes. Given her history, we will risk stratify with a  Lexiscan NST to r/o ischemia.  Several components of her symptoms also mimic possible GI etiologies, particularly GERD. She takes Prilosec daily. I have recommended that we switch her to Protonix, 40 mg daily.    2. HTN: Well controlled. Continue lisinopril-hydrochlorothiazide, metoprolol  3. Hyperlipidemia: Continue statin therapy with simvastatin  4. Diabetes: Management per PCP  PLAN: F/U post stress test to review results and to reevaluate symptoms. Change PPI to Protonix, 40 mg daily.    [name], BRITTAINYPA-C 05/17/2014 9:07 AM

## 2014-05-20 ENCOUNTER — Encounter: Payer: Self-pay | Admitting: Infectious Diseases

## 2014-05-20 ENCOUNTER — Ambulatory Visit (INDEPENDENT_AMBULATORY_CARE_PROVIDER_SITE_OTHER): Payer: Medicare Other | Admitting: Infectious Diseases

## 2014-05-20 VITALS — BP 124/72 | HR 57 | Temp 98.0°F | Ht 63.0 in | Wt 258.0 lb

## 2014-05-20 DIAGNOSIS — M869 Osteomyelitis, unspecified: Secondary | ICD-10-CM | POA: Diagnosis not present

## 2014-05-20 DIAGNOSIS — I2510 Atherosclerotic heart disease of native coronary artery without angina pectoris: Secondary | ICD-10-CM

## 2014-05-20 DIAGNOSIS — Z23 Encounter for immunization: Secondary | ICD-10-CM | POA: Diagnosis not present

## 2014-05-20 DIAGNOSIS — Z7901 Long term (current) use of anticoagulants: Secondary | ICD-10-CM | POA: Diagnosis not present

## 2014-05-20 DIAGNOSIS — M25569 Pain in unspecified knee: Secondary | ICD-10-CM | POA: Diagnosis not present

## 2014-05-20 MED ORDER — DOXYCYCLINE HYCLATE 100 MG PO CAPS: 100.0000 mg | ORAL_CAPSULE | Freq: Two times a day (BID) | ORAL | Status: DC

## 2014-05-20 NOTE — Assessment & Plan Note (Signed)
Reminded pt that she needs to alert her PCP that she is on anbx and coumadin and will need closer monitoring.

## 2014-05-20 NOTE — Progress Notes (Signed)
   Subjective:    Patient ID: [name], female    DOB: May 15, 1951, 63 y.o.   MRN: [id number]  HPI 63 y.o. female with history of oesity, DM2,a-fib (on coumadin) and OA of left knee s/p left TKA in 2004, 2005 and 2006 left knee arthroscopy to remove scar tissue, and then 2007 left TKA revision with scar tissue removal.  Over 2014, she has had severe pain to her left knee concerning for loosening of hardware. Due to inflammatory markers being elevated, there was concern for subacute infection. She was admitted to [facility] on 11-05-13 and underwent removal L TKR and antibiotic spacer placement.. She was started on vancomycin and ceftriaxone. Her Cx were (-). Her hospital course was complicated by ARF. She was d/c to SNF on 11-08-13. She was placed on 8 wks of vancomycin and ceftriaxone until her re-implantation/revision of left TKA 12/31/13. Her inflammatory markers were still elevated prior to surgery however, on inspection in the OR it did not appear that she had ongoing infection. She does have significant bone loss of both tibia and femoral side from previous surgeries. Due to continued elevation of inflammatory markers, she was continued on her IV anbx post-op.  CRP 13.5, ESR 80 on 01-21-14.  She completed her IV anbx 02-15-14, PIC pulled on 02-17-14, and was d/c from SNF on 02-19-14.  She returned to ID clinic on 02-26-14, was started on oral doxy. Her ESR was 61 and her CRP was 1.1.  She has since not been on her doxy as she was not able to get the liquid due to cost. States that she called to get pill but got no return call.  Has been able to bear wt. Still using a walker. No swelling unless left "hanging down all day". Still in PT. No fever or chills.  Has appt with Dr Alvan Dame 05-22-14.   Review of Systems  Constitutional: Negative for fever, chills, appetite change and unexpected weight change.  HENT: Negative for nosebleeds.   Gastrointestinal: Negative for diarrhea and constipation.  Genitourinary:  Negative for difficulty urinating.  Hematological: Does not bruise/bleed easily.       Objective:   Physical Exam  Constitutional: She appears well-developed and well-nourished.  HENT:  Mouth/Throat: No oropharyngeal exudate.  Eyes: EOM are normal. Pupils are equal, round, and reactive to light.  Neck: Neck supple.  Cardiovascular: Normal rate, regular rhythm and normal heart sounds.   Pulmonary/Chest: Effort normal and breath sounds normal.  Abdominal: Soft. Bowel sounds are normal. There is no tenderness.  Musculoskeletal:       Legs: Lymphadenopathy:    She has no cervical adenopathy.          Assessment & Plan:

## 2014-05-21 ENCOUNTER — Telehealth (HOSPITAL_COMMUNITY): Payer: Self-pay

## 2014-05-21 LAB — C-REACTIVE PROTEIN: CRP: 1.1 mg/dL — AB (ref <0.60)

## 2014-05-21 LAB — SEDIMENTATION RATE: SED RATE: 58 mm/h — AB (ref 0–22)

## 2014-05-21 NOTE — Telephone Encounter (Signed)
Encounter complete. 

## 2014-05-22 ENCOUNTER — Ambulatory Visit (HOSPITAL_COMMUNITY)
Admission: RE | Admit: 2014-05-22 | Discharge: 2014-05-22 | Disposition: A | Discharge status: Home / Self Care | Payer: Medicare Other | Source: Ambulatory Visit | Attending: Cardiovascular Disease | Admitting: Cardiovascular Disease

## 2014-05-22 DIAGNOSIS — R079 Chest pain, unspecified: Secondary | ICD-10-CM | POA: Insufficient documentation

## 2014-05-22 DIAGNOSIS — R9431 Abnormal electrocardiogram [ECG] [EKG]: Secondary | ICD-10-CM | POA: Diagnosis not present

## 2014-05-22 DIAGNOSIS — M25569 Pain in unspecified knee: Secondary | ICD-10-CM | POA: Diagnosis not present

## 2014-05-22 DIAGNOSIS — Z96659 Presence of unspecified artificial knee joint: Secondary | ICD-10-CM | POA: Diagnosis not present

## 2014-05-22 MED ORDER — AMINOPHYLLINE 25 MG/ML IV SOLN
75.0000 mg | Freq: Once | INTRAVENOUS | Status: AC
  Administered 2014-05-22: 75 mg via INTRAVENOUS

## 2014-05-22 MED ORDER — REGADENOSON 0.4 MG/5ML IV SOLN
0.4000 mg | Freq: Once | INTRAVENOUS | Status: AC
  Administered 2014-05-22: 0.4 mg via INTRAVENOUS

## 2014-05-22 MED ORDER — TECHNETIUM TC 99M SESTAMIBI GENERIC - CARDIOLITE
30.5000 | Freq: Once | INTRAVENOUS | Status: AC | PRN
Start: 2014-05-22 — End: 2014-05-22
  Administered 2014-05-22: 31 via INTRAVENOUS

## 2014-05-22 NOTE — Procedures (Addendum)
[facility] [address] [place] [address] [phone]  Cardiology Nuclear Med Study  [name] is a 63 y.o. female     MRN : [id number]     DOB: 1951-09-25  Procedure Date: 05/22/2014  Nuclear Med Background Indication for Stress Test:  Evaluation for Ischemia and Abnormal EKG History:  CAD;MI-03/2012;STENT/PTCA-03/2012;PAF;Last NUC MPI on 11/24/2012-nonischemic;EF=70%;ECHO on 07/08/2012-LVEF=55-60% Cardiac Risk Factors: Family History - CAD, History of Smoking, Hypertension, IDDM Type 2, Lipids, Obesity and PVD  Symptoms:  Chest Pain, Palpitations and indigestion   Nuclear Pre-Procedure Caffeine/Decaff Intake:  7:00pm NPO After: 5:00am   IV Site: R Forearm  IV 0.9% NS with Angio Cath:  22g  Chest Size (in):  n/a IV Started by: Rolene Course, RN  Height: 5\' 3"  (1.6 m)  Cup Size: D  BMI:  Body mass index is 46.07 kg/(m^2). Weight:  260 lb (117.935 kg)   Tech Comments:  n/a    Nuclear Med Study 1 or 2 day study: 2 day  Stress Test Type:  [place] Provider:  Sanda Klein, MD   Resting Radionuclide: Technetium 12m Sestamibi  Resting Radionuclide Dose: 30.8 mCi   Stress Radionuclide:  Technetium 104m Sestamibi  Stress Radionuclide Dose: 30.5 mCi           Stress Protocol Rest HR: 56 Stress HR: 96  Rest BP: 162/78 Stress BP: 162/73  Exercise Time (min): n/a METS: n/a   Predicted Max HR: 156 bpm % Max HR: 61.54 bpm Rate Pressure Product: 16416  Dose of Adenosine (mg):  n/a Dose of Lexiscan: 0.4 mg  Dose of Atropine (mg): n/a Dose of Dobutamine: n/a mcg/kg/min (at max HR)  Stress Test Technologist: Leane Para, CCT Nuclear Technologist: Imagene Riches, CNMT   Rest Procedure:  Myocardial perfusion imaging was performed at rest 45 minutes following the intravenous administration of Technetium 68m Sestamibi. Stress Procedure:  The patient received IV Lexiscan 0.4 mg over 15-seconds.   Technetium 45m Sestamibi injected at 30-seconds. Patient had SOB and nausea and 75 mg of Aminophylline IV was administered. There were no significant changes with Lexiscan.  Quantitative spect images were obtained after a 45 minute delay.  Transient Ischemic Dilatation (Normal <1.22):  0.82  QGS EDV:  69 ml QGS ESV:  23 ml LV Ejection Fraction: 67%      Rest ECG: NSR-LVH with approximately 1 mm horizontal ST depression at baseline  Stress ECG: Significant ST abnormalities consistent with ischemia. Additional 2 mm ST depression is seen, downsloping  QPS Raw Data Images:  Moderate breast attenuation. Adequate image quality. Possible processing artifact Stress Images:  Markedly reduced uptake in the entire apex, more extensive in the lateral segment Rest Images:  Marked improvement in the apical abnormality, with moderate residual defect Subtraction (SDS):  These findings are consistent with ischemia. LV Wall Motion:  NL LV Function; NL Wall Motion  Impression Exercise Capacity:  Lexiscan with no exercise. BP Response:  Hypertensive blood pressure response. Clinical Symptoms:  No significant symptoms noted. ECG Impression:  Significant ST abnormalities consistent with ischemia. Comparison with Prior Nuclear Study: Compared to 2013, the apical ischemia is a new finding   Overall Impression:  High risk stress nuclear study suggestive of apical ischemia, possibly a component of nontransmural scar.   Sanda Klein, MD  05/23/2014 12:47 PM

## 2014-05-23 ENCOUNTER — Ambulatory Visit (HOSPITAL_COMMUNITY)
Admission: RE | Admit: 2014-05-23 | Discharge: 2014-05-23 | Disposition: A | Discharge status: Home / Self Care | Payer: Medicare Other | Source: Ambulatory Visit | Attending: Cardiovascular Disease | Admitting: Cardiovascular Disease

## 2014-05-23 ENCOUNTER — Telehealth: Payer: Self-pay | Admitting: *Deleted

## 2014-05-23 ENCOUNTER — Encounter (HOSPITAL_COMMUNITY): Payer: Self-pay | Admitting: Pharmacy Technician

## 2014-05-23 ENCOUNTER — Other Ambulatory Visit: Payer: Self-pay | Admitting: *Deleted

## 2014-05-23 DIAGNOSIS — Z01818 Encounter for other preprocedural examination: Secondary | ICD-10-CM

## 2014-05-23 DIAGNOSIS — R9439 Abnormal result of other cardiovascular function study: Secondary | ICD-10-CM | POA: Diagnosis not present

## 2014-05-23 DIAGNOSIS — R079 Chest pain, unspecified: Secondary | ICD-10-CM

## 2014-05-23 DIAGNOSIS — D689 Coagulation defect, unspecified: Secondary | ICD-10-CM | POA: Diagnosis not present

## 2014-05-23 DIAGNOSIS — R5383 Other fatigue: Secondary | ICD-10-CM | POA: Diagnosis not present

## 2014-05-23 DIAGNOSIS — Z79899 Other long term (current) drug therapy: Secondary | ICD-10-CM

## 2014-05-23 DIAGNOSIS — R5381 Other malaise: Secondary | ICD-10-CM

## 2014-05-23 MED ORDER — TECHNETIUM TC 99M SESTAMIBI GENERIC - CARDIOLITE
30.8000 | Freq: Once | INTRAVENOUS | Status: AC | PRN
  Administered 2014-05-23: 30.8 via INTRAVENOUS

## 2014-05-23 NOTE — Progress Notes (Signed)
High risk nuclear stress test. Scheduled for cardiac cath on Monday, August 31. Instructed to stop warfarin today. Check INR on arrival on Monday. This procedure has been reviewed with the patient and informed consent has been obtained. Sanda Klein, MD, [facility] [phone] office [phone] pager

## 2014-05-23 NOTE — Telephone Encounter (Signed)
Ms [name]'s nuclear study was abnormal and Dr C wanted patient to proceed with LHC by Dr [name] next week.  Dr C called patient and explained procedure.  Patient agreeable to proceed.

## 2014-05-24 LAB — CBC
HCT: 39.2 % (ref 36.0–46.0)
Hemoglobin: 13.2 g/dL (ref 12.0–15.0)
MCH: 28.3 pg (ref 26.0–34.0)
MCHC: 33.7 g/dL (ref 30.0–36.0)
MCV: 83.9 fL (ref 78.0–100.0)
PLATELETS: 249 10*3/uL (ref 150–400)
RBC: 4.67 MIL/uL (ref 3.87–5.11)
RDW: 14.3 % (ref 11.5–15.5)
WBC: 8.6 10*3/uL (ref 4.0–10.5)

## 2014-05-25 LAB — BASIC METABOLIC PANEL
BUN: 11 mg/dL (ref 6–23)
CO2: 30 mEq/L (ref 19–32)
Calcium: 9.7 mg/dL (ref 8.4–10.5)
Chloride: 98 mEq/L (ref 96–112)
Creat: 0.91 mg/dL (ref 0.50–1.10)
GLUCOSE: 258 mg/dL — AB (ref 70–99)
Potassium: 4 mEq/L (ref 3.5–5.3)
Sodium: 139 mEq/L (ref 135–145)

## 2014-05-25 LAB — PROTIME-INR
INR: 2.22 — AB (ref <1.50)
Prothrombin Time: 24.6 seconds — ABNORMAL HIGH (ref 11.6–15.2)

## 2014-05-25 LAB — TSH: TSH: 1.125 u[IU]/mL (ref 0.350–4.500)

## 2014-05-25 LAB — APTT: aPTT: 40 seconds — ABNORMAL HIGH (ref 24–37)

## 2014-05-27 ENCOUNTER — Ambulatory Visit (HOSPITAL_COMMUNITY)
Admission: RE | Admit: 2014-05-27 | Discharge: 2014-05-28 | Disposition: A | Discharge status: Home / Self Care | Payer: Medicare Other | Source: Ambulatory Visit | Attending: Cardiovascular Disease | Admitting: Cardiovascular Disease

## 2014-05-27 ENCOUNTER — Encounter (HOSPITAL_COMMUNITY)
Admission: RE | Disposition: A | Discharge status: Home / Self Care | Payer: Medicare Other | Source: Ambulatory Visit | Attending: Cardiovascular Disease

## 2014-05-27 DIAGNOSIS — I10 Essential (primary) hypertension: Secondary | ICD-10-CM | POA: Diagnosis not present

## 2014-05-27 DIAGNOSIS — Z955 Presence of coronary angioplasty implant and graft: Secondary | ICD-10-CM

## 2014-05-27 DIAGNOSIS — Y849 Medical procedure, unspecified as the cause of abnormal reaction of the patient, or of later complication, without mention of misadventure at the time of the procedure: Secondary | ICD-10-CM | POA: Diagnosis not present

## 2014-05-27 DIAGNOSIS — Z794 Long term (current) use of insulin: Secondary | ICD-10-CM | POA: Insufficient documentation

## 2014-05-27 DIAGNOSIS — E669 Obesity, unspecified: Secondary | ICD-10-CM | POA: Diagnosis not present

## 2014-05-27 DIAGNOSIS — I25110 Atherosclerotic heart disease of native coronary artery with unstable angina pectoris: Secondary | ICD-10-CM

## 2014-05-27 DIAGNOSIS — E119 Type 2 diabetes mellitus without complications: Secondary | ICD-10-CM | POA: Insufficient documentation

## 2014-05-27 DIAGNOSIS — Z7982 Long term (current) use of aspirin: Secondary | ICD-10-CM | POA: Diagnosis not present

## 2014-05-27 DIAGNOSIS — Z7901 Long term (current) use of anticoagulants: Secondary | ICD-10-CM

## 2014-05-27 DIAGNOSIS — Z01818 Encounter for other preprocedural examination: Secondary | ICD-10-CM

## 2014-05-27 DIAGNOSIS — R072 Precordial pain: Secondary | ICD-10-CM

## 2014-05-27 DIAGNOSIS — Z9861 Coronary angioplasty status: Secondary | ICD-10-CM | POA: Insufficient documentation

## 2014-05-27 DIAGNOSIS — K219 Gastro-esophageal reflux disease without esophagitis: Secondary | ICD-10-CM | POA: Diagnosis not present

## 2014-05-27 DIAGNOSIS — Z87891 Personal history of nicotine dependence: Secondary | ICD-10-CM | POA: Insufficient documentation

## 2014-05-27 DIAGNOSIS — E785 Hyperlipidemia, unspecified: Secondary | ICD-10-CM

## 2014-05-27 DIAGNOSIS — I2510 Atherosclerotic heart disease of native coronary artery without angina pectoris: Secondary | ICD-10-CM | POA: Diagnosis present

## 2014-05-27 DIAGNOSIS — I4891 Unspecified atrial fibrillation: Secondary | ICD-10-CM | POA: Diagnosis not present

## 2014-05-27 DIAGNOSIS — I255 Ischemic cardiomyopathy: Secondary | ICD-10-CM

## 2014-05-27 HISTORY — PX: PERCUTANEOUS CORONARY STENT INTERVENTION (PCI-S): SHX5485

## 2014-05-27 HISTORY — PX: LEFT HEART CATHETERIZATION WITH CORONARY ANGIOGRAM: SHX5451

## 2014-05-27 LAB — GLUCOSE, CAPILLARY
Glucose-Capillary: 197 mg/dL — ABNORMAL HIGH (ref 70–99)
Glucose-Capillary: 168 mg/dL — ABNORMAL HIGH (ref 70–99)
Glucose-Capillary: 217 mg/dL — ABNORMAL HIGH (ref 70–99)

## 2014-05-27 LAB — PROTIME-INR
INR: 1.12 (ref 0.00–1.49)
PROTHROMBIN TIME: 14.4 s (ref 11.6–15.2)

## 2014-05-27 LAB — POCT ACTIVATED CLOTTING TIME: Activated Clotting Time: 410 s

## 2014-05-27 SURGERY — LEFT HEART CATHETERIZATION WITH CORONARY ANGIOGRAM
Anesthesia: LOCAL

## 2014-05-27 MED ORDER — ONDANSETRON HCL 4 MG/2ML IJ SOLN
4.0000 mg | Freq: Four times a day (QID) | INTRAMUSCULAR | Status: DC | PRN
  Administered 2014-05-27: 4 mg via INTRAVENOUS
  Filled 2014-05-27: qty 2

## 2014-05-27 MED ORDER — VITAMIN D3 25 MCG (1000 UNIT) PO TABS
1000.0000 [IU] | ORAL_TABLET | Freq: Every day | ORAL | Status: DC
  Filled 2014-05-27 (×2): qty 1

## 2014-05-27 MED ORDER — HYDROCHLOROTHIAZIDE 25 MG PO TABS
25.0000 mg | ORAL_TABLET | Freq: Every day | ORAL | Status: DC
  Filled 2014-05-27: qty 1

## 2014-05-27 MED ORDER — SODIUM CHLORIDE 0.9 % IV SOLN
INTRAVENOUS | Status: DC
  Administered 2014-05-27: 14:00:00 via INTRAVENOUS

## 2014-05-27 MED ORDER — METOCLOPRAMIDE HCL 5 MG PO TABS
5.0000 mg | ORAL_TABLET | Freq: Two times a day (BID) | ORAL | Status: DC | PRN
  Administered 2014-05-28: 5 mg via ORAL
  Filled 2014-05-27: qty 2

## 2014-05-27 MED ORDER — ASPIRIN 81 MG PO CHEW
CHEWABLE_TABLET | ORAL | Status: AC
  Administered 2014-05-27: 81 mg via ORAL
  Filled 2014-05-27: qty 1

## 2014-05-27 MED ORDER — MIDAZOLAM HCL 2 MG/2ML IJ SOLN
INTRAMUSCULAR | Status: AC
  Filled 2014-05-27: qty 2

## 2014-05-27 MED ORDER — LISINOPRIL-HYDROCHLOROTHIAZIDE 20-25 MG PO TABS: 1.0000 | ORAL_TABLET | Freq: Every day | ORAL | Status: DC

## 2014-05-27 MED ORDER — CLOPIDOGREL BISULFATE 300 MG PO TABS
ORAL_TABLET | ORAL | Status: AC
  Filled 2014-05-27: qty 2

## 2014-05-27 MED ORDER — FENTANYL CITRATE 0.05 MG/ML IJ SOLN
INTRAMUSCULAR | Status: AC
  Filled 2014-05-27: qty 2

## 2014-05-27 MED ORDER — PROMETHAZINE HCL 25 MG/ML IJ SOLN
12.5000 mg | INTRAMUSCULAR | Status: DC | PRN
  Administered 2014-05-27: 23:00:00 12.5 mg via INTRAVENOUS
  Filled 2014-05-27: qty 1

## 2014-05-27 MED ORDER — HEPARIN (PORCINE) IN NACL 2-0.9 UNIT/ML-% IJ SOLN
INTRAMUSCULAR | Status: AC
  Filled 2014-05-27: qty 1500

## 2014-05-27 MED ORDER — HEPARIN SODIUM (PORCINE) 1000 UNIT/ML IJ SOLN
INTRAMUSCULAR | Status: AC
  Filled 2014-05-27: qty 1

## 2014-05-27 MED ORDER — BIVALIRUDIN 250 MG IV SOLR
INTRAVENOUS | Status: AC
  Filled 2014-05-27: qty 250

## 2014-05-27 MED ORDER — PRASUGREL HCL 10 MG PO TABS
ORAL_TABLET | ORAL | Status: AC
  Filled 2014-05-27: qty 6

## 2014-05-27 MED ORDER — LIDOCAINE HCL (PF) 1 % IJ SOLN
INTRAMUSCULAR | Status: AC
  Filled 2014-05-27: qty 30

## 2014-05-27 MED ORDER — SODIUM CHLORIDE 0.9 % IV SOLN: INTRAVENOUS | Status: AC

## 2014-05-27 MED ORDER — SIMVASTATIN 40 MG PO TABS
40.0000 mg | ORAL_TABLET | Freq: Every evening | ORAL | Status: DC
  Filled 2014-05-27 (×2): qty 1

## 2014-05-27 MED ORDER — LISINOPRIL 20 MG PO TABS
20.0000 mg | ORAL_TABLET | Freq: Every day | ORAL | Status: DC
  Filled 2014-05-27: qty 1

## 2014-05-27 MED ORDER — SODIUM CHLORIDE 0.9 % IV SOLN: 0.2500 mg/kg/h | INTRAVENOUS | Status: AC

## 2014-05-27 MED ORDER — CLOPIDOGREL BISULFATE 75 MG PO TABS
ORAL_TABLET | ORAL | Status: AC
  Filled 2014-05-27: qty 1

## 2014-05-27 MED ORDER — ACETAMINOPHEN 325 MG PO TABS: 650.0000 mg | ORAL_TABLET | ORAL | Status: DC | PRN

## 2014-05-27 MED ORDER — SODIUM CHLORIDE 0.9 % IJ SOLN: 3.0000 mL | INTRAMUSCULAR | Status: DC | PRN

## 2014-05-27 MED ORDER — NITROGLYCERIN 1 MG/10 ML FOR IR/CATH LAB
INTRA_ARTERIAL | Status: AC
  Filled 2014-05-27: qty 10

## 2014-05-27 MED ORDER — CLOPIDOGREL BISULFATE 75 MG PO TABS
75.0000 mg | ORAL_TABLET | Freq: Every day | ORAL | Status: DC
  Administered 2014-05-28: 75 mg via ORAL
  Filled 2014-05-27: qty 1

## 2014-05-27 MED ORDER — METOPROLOL TARTRATE 50 MG PO TABS
50.0000 mg | ORAL_TABLET | Freq: Two times a day (BID) | ORAL | Status: DC
  Administered 2014-05-27 – 2014-05-28 (×2): 50 mg via ORAL
  Filled 2014-05-27: qty 1
  Filled 2014-05-27: qty 2
  Filled 2014-05-27 (×2): qty 1

## 2014-05-27 MED ORDER — INSULIN ASPART PROT & ASPART (70-30 MIX) 100 UNIT/ML ~~LOC~~ SUSP
25.0000 [IU] | Freq: Two times a day (BID) | SUBCUTANEOUS | Status: DC
  Administered 2014-05-28: 25 [IU] via SUBCUTANEOUS
  Filled 2014-05-27: qty 10

## 2014-05-27 MED ORDER — ASPIRIN 81 MG PO CHEW: 81.0000 mg | CHEWABLE_TABLET | Freq: Every day | ORAL | Status: DC

## 2014-05-27 MED ORDER — ASPIRIN 81 MG PO CHEW
81.0000 mg | CHEWABLE_TABLET | ORAL | Status: AC
  Administered 2014-05-27: 81 mg via ORAL

## 2014-05-27 MED ORDER — PANTOPRAZOLE SODIUM 40 MG PO TBEC
40.0000 mg | DELAYED_RELEASE_TABLET | Freq: Every day | ORAL | Status: DC
  Administered 2014-05-28: 40 mg via ORAL
  Filled 2014-05-27: qty 1

## 2014-05-27 MED ORDER — PANTOPRAZOLE SODIUM 40 MG IV SOLR
40.0000 mg | Freq: Once | INTRAVENOUS | Status: AC
  Administered 2014-05-27: 40 mg via INTRAVENOUS
  Filled 2014-05-27: qty 40

## 2014-05-27 MED ORDER — HYDRALAZINE HCL 20 MG/ML IJ SOLN
10.0000 mg | INTRAMUSCULAR | Status: DC | PRN
  Administered 2014-05-27: 19:00:00 10 mg via INTRAVENOUS
  Filled 2014-05-27: qty 1

## 2014-05-27 NOTE — H&P (View-Only) (Signed)
05/17/2014 [name]   28-Jun-1951  [id number]  Primary Physicia Georgianne Fick, MD Primary Cardiologist: Margo Aye  HPI:  The patient is a 63 year old African American female, followed by Dr. [name], who presents to clinic today for evaluation of chest pain. She has a known history of CAD, status post PCI plus drug-eluting stenting to her LAD on 04/07/2012. She also has PVD, status post left proximal and mid SFA Turbo Hawk atherectomy   in 2012. Her other problems include hypertension, obesity, diabetes, GERD and paroxysmal atrial fibrillation for which she takes warfarin. Her INRs are followed in our clinic.  She notes her recent symptoms started roughly 1 month ago. There is some similarity in her discomfort compared to her angina in 2013, however symptoms are not as severe. Described as substernal to left chest discomfort. Pressure like. Not worse with exertion. Worse after eating fried foods. No assocaited dsypnea, diaphoresis, palpitations, dizziness, syncope/near syncope. She does note associated belching. 2/10 in maximum intensity. Last for 1 min or so before spontaneously resolving. Compliant with medications. On Prilosec for GERD.  Her EKG shows notable Twave changes compared to prior EKG in 2014. She had notable Twave flattening in the lateral leads. BP and HR both are stable.    Current Outpatient Prescriptions  Medication Sig Dispense Refill  . aspirin 81 MG chewable tablet Chew 81 mg by mouth daily.      . cholecalciferol (VITAMIN D) 1000 UNITS tablet Take 1,000 Units by mouth at bedtime.       Marland Kitchen doxycycline (VIBRAMYCIN) 50 MG/5ML SYRP Take 10 mLs (100 mg total) by mouth 2 (two) times daily.  400 mL  6  . insulin aspart protamine- aspart (NOVOLOG MIX 70/30) (70-30) 100 UNIT/ML injection 25 UNITS IN AM AND 25 UNITS INEVENING      . lisinopril-hydrochlorothiazide (PRINZIDE,ZESTORETIC) 20-25 MG per tablet Take 1 tablet by mouth daily.      . metoCLOPramide (REGLAN) 10 MG  tablet Take 5-10 mg by mouth as directed. 5 mg in the morning then 10mg  in at bedtime if needed for nausea      . metoprolol (LOPRESSOR) 50 MG tablet Take 1 tablet (50 mg total) by mouth 2 (two) times daily.  60 tablet  5  . omeprazole (PRILOSEC) 40 MG capsule Take 40 mg by mouth daily.      . simvastatin (ZOCOR) 40 MG tablet Take 40 mg by mouth every evening.      . warfarin (COUMADIN) 7.5 MG tablet TAKE 1 TO 1.5 TABLETS BY MOUTH DAILY AS DIRECTED  40 tablet  3  . albuterol (PROVENTIL HFA;VENTOLIN HFA) 108 (90 BASE) MCG/ACT inhaler Inhale 1-2 puffs into the lungs every 6 (six) hours as needed for wheezing.  1 Inhaler  0   No current facility-administered medications for this visit.    Allergies  Allergen Reactions  . Levofloxacin Itching  . Morphine And Related Itching    History   Social History  . Marital Status: Single    Spouse Name: N/A    Number of Children: N/A  . Years of Education: N/A   Occupational History  . Not on file.   Social History Main Topics  . Smoking status: Former Smoker -- 1.00 packs/day for 20 years    Types: Cigarettes    Quit date: 09/27/1984  . Smokeless tobacco: Never Used  . Alcohol Use: No  . Drug Use: No  . Sexual Activity: Not on file   Other Topics Concern  . Not on  file   Social History Narrative  . No narrative on file     Review of Systems: General: negative for chills, fever, night sweats or weight changes.  Cardiovascular: negative for chest pain, dyspnea on exertion, edema, orthopnea, palpitations, paroxysmal nocturnal dyspnea or shortness of breath Dermatological: negative for rash Respiratory: negative for cough or wheezing Urologic: negative for hematuria Abdominal: negative for nausea, vomiting, diarrhea, bright red blood per rectum, melena, or hematemesis Neurologic: negative for visual changes, syncope, or dizziness All other systems reviewed and are otherwise negative except as noted above.    Blood pressure  138/78, pulse 70, height 5\' 3"  (1.6 m), weight 260 lb 1.6 oz (117.981 kg).  General appearance: alert, cooperative, no distress and morbidly obese Neck: no carotid bruit, no JVD Lungs: clear to auscultation bilaterally Heart: regular rate and rhythm, S1, S2 normal, no murmur, click, rub or gallop Extremities: no LEE Pulses: 2+ and symmetric Skin: Skin color, texture, turgor normal. No rashes or lesions Neurologic: Grossly normal  EKG NSR with nonspecific ST and T wave abnormality (notable Twave flattening in V4-V5. New compared to prior EKG.   ASSESSMENT AND PLAN:   1. Chest pain/ indigestion: she is endorsing mixed typical and atypical features. She has a known history of CAD status post PCI to the LAD in 2013. She also has other multiple cardiac risk factors including obesity, hypertension, hyperlipidemia and diabetes. Given her history, we will risk stratify with a  Lexiscan NST to r/o ischemia.  Several components of her symptoms also mimic possible GI etiologies, particularly GERD. She takes Prilosec daily. I have recommended that we switch her to Protonix, 40 mg daily.    2. HTN: Well controlled. Continue lisinopril-hydrochlorothiazide, metoprolol  3. Hyperlipidemia: Continue statin therapy with simvastatin  4. Diabetes: Management per PCP  PLAN: F/U post stress test to review results and to reevaluate symptoms. Change PPI to Protonix, 40 mg daily.    [name], BRITTAINYPA-C 05/17/2014 9:07 AM

## 2014-05-27 NOTE — Progress Notes (Signed)
Patient's blood pressure elevated, she did not take any medications this morning. 1850 noted that heart rate and blood pressure elevated and given metoprolol 50mg  po and hydralazine 10mg  IV. Monitored and noted good effect from medications at 1935 Blood pressure 110/25 and heart rate 81. Patient remained in sinus, no episodes of atrial fibrillation noted. 2000 blood pressure 105/33 and heart rate 70's in sinus rhythm.

## 2014-05-27 NOTE — CV Procedure (Addendum)
[name] is a 63 y.o. female    [id number] LOCATION:  FACILITY: [facility]  PHYSICIAN: Quay Burow, M.D. [birth year]   DATE OF PROCEDURE:  05/27/2014  DATE OF DISCHARGE:     CARDIAC CATHETERIZATION / Intervention    History obtained from chart review.The patient is a 63 year old African American female, followed by Dr. [name], who presents to clinic today for evaluation of chest pain. She has a known history of CAD, status post PCI plus drug-eluting stenting to her LAD on 04/07/2012. She also has PVD, status post left proximal and mid SFA Turbo Hawk atherectomy  in 2012. Her other problems include hypertension, obesity, diabetes, GERD and paroxysmal atrial fibrillation for which she takes warfarin. Her INRs are followed in our clinic.  She notes her recent symptoms started roughly 1 month ago. There is some similarity in her discomfort compared to her angina in 2013, however symptoms are not as severe. Described as substernal to left chest discomfort. Pressure like. Not worse with exertion. Worse after eating fried foods. No assocaited dsypnea, diaphoresis, palpitations, dizziness, syncope/near syncope. She does note associated belching. 2/10 in maximum intensity. Last for 1 min or so before spontaneously resolving. Compliant with medications. On Prilosec for GERD.  Her EKG shows notable Twave changes compared to prior EKG in 2014. She had notable Twave flattening in the lateral leads. BP and HR both are stable.    PROCEDURE DESCRIPTION:   The patient was brought to the second floor [facility] cath lab in the postabsorptive state. She was premedicated with Valium 5 mg by mouth, IV Versed and fentanyl. Her right groinwas prepped and shaved in usual sterile fashion. Xylocaine 1% was used for local anesthesia. A 6 French sheath was inserted into the right common femoral artery using standard Seldinger technique.5 French right and left Judkins diagnostic catheters (JL 4.0 for  circumflex and JL 3.5 for LAD, double-barreled left main), and pigtail catheters were used for selective coronary angiography, subselective left internal memory artery angiography and left ventriculography. Visipaque dye was used for the entirety of the case (180 cc administered pacing). Retrograde aortic, left ventricular and pullback pressures were recorded.   HEMODYNAMICS:    AO SYSTOLIC/AO DIASTOLIC: 768/11   LV SYSTOLIC/LV DIASTOLIC: 572/62  ANGIOGRAPHIC RESULTS:   1. Left main; normal  2. LAD; 99% "in-stent restenosis within the proximal LAD stent. 3. Left circumflex; 50-60% mid AV groove stenosis. The circumflex also gave off a ramus intermedius branch..  4. Right coronary artery; dominant and free of significant disease 5.LIMA was subselectively visualized a widely patent. It was suitable for use during coronary artery bypass grafting if necessary 6. Left ventriculography; RAO left ventriculogram was performed using  25 mL of Visipaque dye at 12 mL/second. The overall LVEF estimated  60 %  Without wall motion abnormalities  IMPRESSION:[name] has high-grade "in-stent restenosis and pilot in the proximal LAD stent. We'll proceed with endoscopic PTCA plus or minus restenting. We will use Angiomax and dual antibiotic therapy. The patient was unable to take Plavix because of the size of the pill and therefore we will do her with Effient  with the intent to treat her with Plavix afterwards.   Procedure description: The patient received Angiomax bolus and ACT of 410. Using a 6 [place] XB 3.0 cm LAD guide catheter with side holes along with a 0.14/190 cm long [place] wire guidewire and a 2 mm x 15 mm long angiography limb predilatation was performed. Following this, a 3 mm x 15  mm and is felt atherotomy balloon was then used to dilate within the stented segment at 12 atmospheres. There did appear to be a small proximal edge dissection with some moderate disease in this area which was  stented with a 3 mm x 12 mm long Xience Xpedition drug-eluting stent at 16 atmospheres (3.26 L). The final result reduction of a 9 he 9% in-stent restenosis to 0% residual with excellent flow. It should be noted that with balloon inflation the patient did have ST segment depression.  Final impression: Successful endoscopic angioplasty and drug-eluting stenting of proximal LAD with Angiomax and the length of therapy. The Angiomax will be continued for 4 hours at reduced dose. She be removed in several hours impressionably held. The patient will be hydrated overnight and discharged home in the morning on aspirin and Plavix.   [name] MD, [facility] 05/27/2014 4:33 PM

## 2014-05-27 NOTE — Interval H&P Note (Signed)
Cath Lab Visit (complete for each Cath Lab visit)  Clinical Evaluation Leading to the Procedure:   ACS: No.  Non-ACS:    Anginal Classification: CCS III  Anti-ischemic medical therapy: Minimal Therapy (1 class of medications)  Non-Invasive Test Results: High-risk stress test findings: cardiac mortality >3%/year  Prior CABG: No previous CABG      History and Physical Interval Note:  05/27/2014 2:57 PM  [name]  has presented today for surgery, with the diagnosis of abnormal nuc  The various methods of treatment have been discussed with the patient and family. After consideration of risks, benefits and other options for treatment, the patient has consented to  Procedure(s): LEFT HEART CATHETERIZATION WITH CORONARY ANGIOGRAM (N/A) as a surgical intervention .  The patient's history has been reviewed, patient examined, no change in status, stable for surgery.  I have reviewed the patient's chart and labs.  Questions were answered to the patient's satisfaction.     [name]

## 2014-05-28 ENCOUNTER — Telehealth: Payer: Self-pay | Admitting: Cardiovascular Disease

## 2014-05-28 ENCOUNTER — Encounter (HOSPITAL_COMMUNITY): Payer: Self-pay | Admitting: *Deleted

## 2014-05-28 DIAGNOSIS — I200 Unstable angina: Secondary | ICD-10-CM

## 2014-05-28 DIAGNOSIS — K219 Gastro-esophageal reflux disease without esophagitis: Secondary | ICD-10-CM | POA: Diagnosis not present

## 2014-05-28 DIAGNOSIS — I10 Essential (primary) hypertension: Secondary | ICD-10-CM

## 2014-05-28 DIAGNOSIS — Z01818 Encounter for other preprocedural examination: Secondary | ICD-10-CM

## 2014-05-28 DIAGNOSIS — E785 Hyperlipidemia, unspecified: Secondary | ICD-10-CM | POA: Diagnosis not present

## 2014-05-28 DIAGNOSIS — Z7901 Long term (current) use of anticoagulants: Secondary | ICD-10-CM

## 2014-05-28 DIAGNOSIS — E669 Obesity, unspecified: Secondary | ICD-10-CM | POA: Diagnosis not present

## 2014-05-28 DIAGNOSIS — E119 Type 2 diabetes mellitus without complications: Secondary | ICD-10-CM | POA: Diagnosis not present

## 2014-05-28 DIAGNOSIS — I2510 Atherosclerotic heart disease of native coronary artery without angina pectoris: Secondary | ICD-10-CM | POA: Diagnosis not present

## 2014-05-28 LAB — BASIC METABOLIC PANEL
ANION GAP: 14 (ref 5–15)
BUN: 15 mg/dL (ref 6–23)
CALCIUM: 9.3 mg/dL (ref 8.4–10.5)
CO2: 24 meq/L (ref 19–32)
CREATININE: 0.9 mg/dL (ref 0.50–1.10)
Chloride: 100 mEq/L (ref 96–112)
GFR calc Af Amer: 77 mL/min — ABNORMAL LOW (ref >90)
GFR, EST NON AFRICAN AMERICAN: 66 mL/min — AB (ref >90)
Glucose, Bld: 276 mg/dL — ABNORMAL HIGH (ref 70–99)
Potassium: 4.2 mEq/L (ref 3.7–5.3)
Sodium: 138 mEq/L (ref 137–147)

## 2014-05-28 LAB — CBC
HEMATOCRIT: 33.6 % — AB (ref 36.0–46.0)
Hemoglobin: 11.4 g/dL — ABNORMAL LOW (ref 12.0–15.0)
MCH: 28 pg (ref 26.0–34.0)
MCHC: 33.9 g/dL (ref 30.0–36.0)
MCV: 82.6 fL (ref 78.0–100.0)
PLATELETS: 199 10*3/uL (ref 150–400)
RBC: 4.07 MIL/uL (ref 3.87–5.11)
RDW: 13.2 % (ref 11.5–15.5)
WBC: 8.6 10*3/uL (ref 4.0–10.5)

## 2014-05-28 LAB — GLUCOSE, CAPILLARY
GLUCOSE-CAPILLARY: 197 mg/dL — AB (ref 70–99)
Glucose-Capillary: 125 mg/dL — ABNORMAL HIGH (ref 70–99)

## 2014-05-28 MED ORDER — CLOPIDOGREL BISULFATE 75 MG PO TABS: 75.0000 mg | ORAL_TABLET | Freq: Every day | ORAL | Status: DC

## 2014-05-28 MED FILL — Sodium Chloride IV Soln 0.9%: INTRAVENOUS | Qty: 50 | Status: AC

## 2014-05-28 MED FILL — Clopidogrel Bisulfate Tab 300 MG (Base Equiv): ORAL | Qty: 2 | Status: AC

## 2014-05-28 NOTE — Progress Notes (Signed)
CARDIAC REHAB PHASE I   PRE:  Rate/Rhythm: 109 ST  BP:  Supine:   Sitting: 153/54  Standing:    SaO2:   MODE:  Ambulation: 100 ft   POST:  Rate/Rhythm: 142 ST  Quickly to 120 with rest  BP:  Supine:   Sitting: 192/54,    141/40 after lying in bed for few minutes  Standing:    SaO2:  0805-0900 Pt walked 100 ft on RA with rolling walker independently. Gait slow and steady with pt leaning forward. Only walks short distances due to knee surgeries. Heart rate and BP elevated with activity but decreased with rest. Told pt about CRP 2 and she stated she would like to try program. Will refer to [place]. Discussed carb counting as pt not doing that but does have some knowledge of how to do. Gave diabetic diet. Discussed NTG use and plavix. RN aware of elevated HR and BP with activity. To bed after walk.   Graylon Good, RN BSN  05/28/2014 8:53 AM

## 2014-05-28 NOTE — Discharge Summary (Signed)
Physician Discharge Summary      Patient ID: [name] MRN: [id number] DOB/AGE: [birth year] 63 y.o.  Admit date: 05/27/2014 Discharge date: 05/28/2014  Admission Diagnoses:  CAD,  Discharge Diagnoses:  Active Problems:   CAD, LAD DES 04/07/12 - Low risk Myoview March 2014   HTN   HLD   DM    Obesity  Discharged Condition: stable  Hospital Course:   The patient is a 63 year old Obese African American female, followed by Dr. [name], who has a known history of CAD, status post PCI plus drug-eluting stenting to her LAD on 04/07/2012. She also has PVD, status post left proximal and mid SFA Turbo Hawk atherectomy in 2012. Her other problems include hypertension, diabetes, GERD and paroxysmal atrial fibrillation for which she takes warfarin. Her INRs are followed in our clinic.   She notes her recent symptoms started roughly 1 month ago. There is some similarity in her discomfort compared to her angina in 2013, however symptoms are not as severe. Described as substernal to left chest discomfort. Pressure like. Not worse with exertion. Worse after eating fried foods. No assocaited dsypnea, diaphoresis, palpitations, dizziness, syncope/near syncope. She does note associated belching. 2/10 in maximum intensity. Last for 1 min or so before spontaneously resolving. Compliant with medications. On Prilosec for GERD.  Her EKG shows notable Twave changes compared to prior EKG in 2014. She had notable Twave flattening in the lateral leads. BP and HR both are stable.  She under went nuclear stress testing which was high risk and suggestive of apical ischemia, possibly a component of nontransmural scar.  She was scheduled for, and underwent, left heart cath and successful angioplasty and drug-eluting stenting of proximal LAD.   She is on ASA and plavix.  She ambulated with cardiac rehab using a rolling walker.  Restart coumadin The patient was seen by Dr. Eldridge Dace who felt she was stable for DC  home.    Consults: None  Significant Diagnostic Studies: CARDIAC CATHETERIZATION / Intervention  History obtained from chart review.The patient is a 63 year old African American female, followed by Dr. [name], who presents to clinic today for evaluation of chest pain. She has a known history of CAD, status post PCI plus drug-eluting stenting to her LAD on 04/07/2012. She also has PVD, status post left proximal and mid SFA Turbo Hawk atherectomy  in 2012. Her other problems include hypertension, obesity, diabetes, GERD and paroxysmal atrial fibrillation for which she takes warfarin. Her INRs are followed in our clinic.  She notes her recent symptoms started roughly 1 month ago. There is some similarity in her discomfort compared to her angina in 2013, however symptoms are not as severe. Described as substernal to left chest discomfort. Pressure like. Not worse with exertion. Worse after eating fried foods. No assocaited dsypnea, diaphoresis, palpitations, dizziness, syncope/near syncope. She does note associated belching. 2/10 in maximum intensity. Last for 1 min or so before spontaneously resolving. Compliant with medications. On Prilosec for GERD.  Her EKG shows notable Twave changes compared to prior EKG in 2014. She had notable Twave flattening in the lateral leads. BP and HR both are stable.  PROCEDURE DESCRIPTION:  The patient was brought to the second floor [facility] cath lab in the postabsorptive state. She was premedicated with Valium 5 mg by mouth, IV Versed and fentanyl. Her right groinwas prepped and shaved in usual sterile fashion. Xylocaine 1% was used for local anesthesia. A 6 French sheath was inserted into the right common femoral artery  using standard Seldinger technique.5 French right and left Judkins diagnostic catheters (JL 4.0 for circumflex and JL 3.5 for LAD, double-barreled left main), and pigtail catheters were used for selective coronary angiography, subselective left  internal memory artery angiography and left ventriculography. Visipaque dye was used for the entirety of the case (180 cc administered pacing). Retrograde aortic, left ventricular and pullback pressures were recorded.  HEMODYNAMICS:  AO SYSTOLIC/AO DIASTOLIC: 190/80  LV SYSTOLIC/LV DIASTOLIC: 198/17  ANGIOGRAPHIC RESULTS:  1. Left main; normal  2. LAD; 99% "in-stent restenosis within the proximal LAD stent.  3. Left circumflex; 50-60% mid AV groove stenosis. The circumflex also gave off a ramus intermedius branch..  4. Right coronary artery; dominant and free of significant disease  5.LIMA was subselectively visualized a widely patent. It was suitable for use during coronary artery bypass grafting if necessary  6. Left ventriculography; RAO left ventriculogram was performed using  25 mL of Visipaque dye at 12 mL/second. The overall LVEF estimated  60 % Without wall motion abnormalities  IMPRESSION:Ms. Imani has high-grade "in-stent restenosis and pilot in the proximal LAD stent. We'll proceed with endoscopic PTCA plus or minus restenting. We will use Angiomax and dual antibiotic therapy. The patient was unable to take Plavix because of the size of the pill and therefore we will do her with Effient with the intent to treat her with Plavix afterwards.  Procedure description: The patient received Angiomax bolus and ACT of 410. Using a 6 [place] XB 3.0 cm LAD guide catheter with side holes along with a 0.14/190 cm long [place]ater wire guidewire and a 2 mm x 15 mm long angiography limb predilatation was performed. Following this, a 3 mm x 15 mm and is felt atherotomy balloon was then used to dilate within the stented segment at 12 atmospheres. There did appear to be a small proximal edge dissection with some moderate disease in this area which was stented with a 3 mm x 12 mm long Xience Xpedition drug-eluting stent at 16 atmospheres (3.26 L). The final result reduction of a 9 he 9% in-stent restenosis to  0% residual with excellent flow. It should be noted that with balloon inflation the patient did have ST segment depression.   Final impression: Successful endoscopic angioplasty and drug-eluting stenting of proximal LAD with Angiomax and the length of therapy. The Angiomax will be continued for 4 hours at reduced dose. She be removed in several hours impressionably held. The patient will be hydrated overnight and discharged home in the morning on aspirin and Plavix.  Runell Gess MD, [facility]  05/27/2014  Treatments: See above  Discharge Exam: Blood pressure 139/48, pulse 113, temperature 98.4 F (36.9 C), temperature source Oral, resp. rate 18, height 5\' 3"  (1.6 m), weight 257 lb 8 oz (116.8 kg), SpO2 95.00%. General appearance: alert, cooperative and no distress Resp: clear to auscultation bilaterally Cardio: regular rate and rhythm, S1, S2 normal, no murmur, click, rub or gallop Extremities: No LEE Pulses: 2+ and symmetric Skin: Warm and dry.  No ecchymosis, hematoma or tenderness in right groin Neurologic: Grossly normal  Disposition: 03-Skilled Nursing Facility      Discharge Instructions   Amb Referral to Cardiac Rehabilitation    Complete by:  As directed      Diet - low sodium heart healthy    Complete by:  As directed      Discharge instructions    Complete by:  As directed   No lifting more than a half gallon of milk or  driving for three days.     Increase activity slowly    Complete by:  As directed             Medication List         aspirin 81 MG chewable tablet  Chew 81 mg by mouth daily.     cholecalciferol 1000 UNITS tablet  Commonly known as:  VITAMIN D  Take 1,000 Units by mouth at bedtime.     clopidogrel 75 MG tablet  Commonly known as:  PLAVIX  Take 1 tablet (75 mg total) by mouth daily with breakfast.     doxycycline 100 MG capsule  Commonly known as:  VIBRAMYCIN  Take 1 capsule (100 mg total) by mouth 2 (two) times daily.     insulin  aspart protamine- aspart (70-30) 100 UNIT/ML injection  Commonly known as:  NOVOLOG MIX 70/30  Inject 25 Units into the skin 2 (two) times daily.     lisinopril-hydrochlorothiazide 20-25 MG per tablet  Commonly known as:  PRINZIDE,ZESTORETIC  Take 1 tablet by mouth daily.     metoCLOPramide 10 MG tablet  Commonly known as:  REGLAN  Take 5-10 mg by mouth 2 (two) times daily as needed (for nausea). 5 mg in the morning then 10mg  in at bedtime     metoprolol 50 MG tablet  Commonly known as:  LOPRESSOR  Take 1 tablet (50 mg total) by mouth 2 (two) times daily.     pantoprazole 40 MG tablet  Commonly known as:  PROTONIX  Take 1 tablet (40 mg total) by mouth daily.     simvastatin 40 MG tablet  Commonly known as:  ZOCOR  Take 40 mg by mouth every evening.     warfarin 7.5 MG tablet  Commonly known as:  COUMADIN  Take 3.75-7.5 mg by mouth daily. Taking 7.5 mg daily except take 3.75 mg on Monday       Follow-up Information   Follow up with Runell Gess, MD.   Specialty:  Cardiology   Contact information:   [address] [place] [address] [phone]       Follow up with Phillips Hay, RPH-CPP On 05/31/2014. (INR check.  The office will call you with the time.)    Contact information:   [address] [place] [address] [phone]      Greater than 30 minutes was spent completing the patient's discharge.   SignedWilburt Finlay, PA-C 05/28/2014, 9:53 AM  I have examined the patient and reviewed assessment and plan and discussed with patient.  Agree with above as stated.  Right groin stable.  1+ right DP pulse.  Stressed importance of plavix.  Would stop aspirin when she is therapeutic on her coumadin.  Taylr Meuth S.

## 2014-05-28 NOTE — Care Management Note (Addendum)
  Page 1 of 1   05/28/2014     10:21:09 AM CARE MANAGEMENT NOTE 05/28/2014  Patient:  PARYS, ELENBAAS   Account Number:  [id number]  Date Initiated:  05/28/2014  Documentation initiated by:  Daniil Labarge  Subjective/Objective Assessment:   CAD     Action/Plan:   CM to follow for dispositon needs   Anticipated DC Date:  05/28/2014   Anticipated DC Plan:  HOME/SELF CARE         Choice offered to / List presented to:             Status of service:  Completed, signed off Medicare Important Message given?   (If response is "NO", the following Medicare IM given date fields will be blank) Date Medicare IM given:   Medicare IM given by:   Date Additional Medicare IM given:   Additional Medicare IM given by:    Discharge Disposition:  HOME/SELF CARE  Per UR Regulation:    If discussed at Long Length of Stay Meetings, dates discussed:    Comments:  Abbi Mancini RN, BSN, MSHL, CCM  Nurse - Case Manager,  (Unit [phone]  05/28/2014 Med review:  Plavix Dispo:  home / self care.

## 2014-05-28 NOTE — Telephone Encounter (Signed)
Returned call to patient. Informed her plavix was sent to pharmacy this morning. She also stated she needs an INR check on Friday 9/4 - appmt scheduled for 2:10.   Patient needs post-cath f/up with Dr. [name] or extender - message forwarded to scheduler

## 2014-05-28 NOTE — Telephone Encounter (Signed)
Patient states that she has just had a stent placed and wants to know if she should be on Plavix?  If so, can we call the pharmacy?

## 2014-05-28 NOTE — Progress Notes (Signed)
Site area: right groin  Site Prior to Removal:  Level 0  Pressure Applied For 20 MINUTES    Minutes Beginning at 22:35  Manual:   Yes.    Patient Status During Pull:  AXOX3  Post Pull Groin Site:  Level 0  Post Pull Instructions Given:  Yes.    Post Pull Pulses Present:  Yes.    Dressing Applied:  Yes.    Comments:  Pt tolerated removal of sheath without complications nor distress, will continue to monitor patient

## 2014-05-31 ENCOUNTER — Ambulatory Visit (INDEPENDENT_AMBULATORY_CARE_PROVIDER_SITE_OTHER): Payer: Medicare Other | Admitting: Pharmacist Clinician (PhC)/ Clinical Pharmacy Specialist

## 2014-05-31 DIAGNOSIS — Z7901 Long term (current) use of anticoagulants: Secondary | ICD-10-CM | POA: Diagnosis not present

## 2014-05-31 DIAGNOSIS — I4891 Unspecified atrial fibrillation: Secondary | ICD-10-CM | POA: Diagnosis not present

## 2014-05-31 DIAGNOSIS — I480 Paroxysmal atrial fibrillation: Secondary | ICD-10-CM

## 2014-05-31 LAB — POCT INR: INR: 1.3

## 2014-06-07 ENCOUNTER — Ambulatory Visit (INDEPENDENT_AMBULATORY_CARE_PROVIDER_SITE_OTHER): Payer: Medicare Other | Admitting: Pharmacist Clinician (PhC)/ Clinical Pharmacy Specialist

## 2014-06-07 DIAGNOSIS — Z7901 Long term (current) use of anticoagulants: Secondary | ICD-10-CM | POA: Diagnosis not present

## 2014-06-07 DIAGNOSIS — I480 Paroxysmal atrial fibrillation: Secondary | ICD-10-CM

## 2014-06-07 DIAGNOSIS — I4891 Unspecified atrial fibrillation: Secondary | ICD-10-CM | POA: Diagnosis not present

## 2014-06-07 LAB — POCT INR: INR: 2.4

## 2014-06-21 ENCOUNTER — Ambulatory Visit (INDEPENDENT_AMBULATORY_CARE_PROVIDER_SITE_OTHER): Payer: Medicare Other | Admitting: Physician Assistant

## 2014-06-21 ENCOUNTER — Encounter: Payer: Self-pay | Admitting: Physician Assistant

## 2014-06-21 VITALS — BP 140/70 | HR 52 | Ht 63.0 in | Wt 257.7 lb

## 2014-06-21 DIAGNOSIS — I255 Ischemic cardiomyopathy: Secondary | ICD-10-CM

## 2014-06-21 DIAGNOSIS — I4891 Unspecified atrial fibrillation: Secondary | ICD-10-CM

## 2014-06-21 DIAGNOSIS — I2510 Atherosclerotic heart disease of native coronary artery without angina pectoris: Secondary | ICD-10-CM

## 2014-06-21 DIAGNOSIS — I2589 Other forms of chronic ischemic heart disease: Secondary | ICD-10-CM

## 2014-06-21 DIAGNOSIS — I10 Essential (primary) hypertension: Secondary | ICD-10-CM

## 2014-06-21 DIAGNOSIS — I480 Paroxysmal atrial fibrillation: Secondary | ICD-10-CM

## 2014-06-21 DIAGNOSIS — I739 Peripheral vascular disease, unspecified: Secondary | ICD-10-CM

## 2014-06-21 DIAGNOSIS — Z7901 Long term (current) use of anticoagulants: Secondary | ICD-10-CM

## 2014-06-21 DIAGNOSIS — E119 Type 2 diabetes mellitus without complications: Secondary | ICD-10-CM

## 2014-06-21 DIAGNOSIS — E785 Hyperlipidemia, unspecified: Secondary | ICD-10-CM

## 2014-06-21 NOTE — Assessment & Plan Note (Signed)
Appears euvolemic  

## 2014-06-21 NOTE — Progress Notes (Signed)
Date:  06/21/2014   ID:  [name], DOB 1951/01/10, MRN [id number]  PCP:  [name], MD  Primary Cardiologist:  Croitoru/Berry(PAD)    History of Present Illness: [name] is a 63 y.o. female The patient is a 63 year old Obese African American female, followed by Dr. [name], who has a known history of CAD, status post PCI plus drug-eluting stenting to her LAD on 04/07/2012. She also has PVD, status post left proximal and mid SFA Turbo Hawk atherectomy in 2012. Her other problems include hypertension, diabetes, GERD and paroxysmal atrial fibrillation for which she takes warfarin. Her INRs are followed in our clinic.   She under went nuclear stress testing which was high risk and suggestive of apical ischemia, possibly a component of nontransmural scar. She was scheduled for, and underwent, left heart cath on 05/27/2014 and subsequent successful angioplasty and drug-eluting stenting of proximal LAD. She is on ASA and plavix, Coumadin  He presents today for posthospital evaluation.  She reports doing well and currently denies nausea, vomiting, fever, chest pain, shortness of breath, orthopnea, dizziness, PND, cough, congestion, abdominal pain, hematochezia, melena, lower extremity edema, claudication.  Wt Readings from Last 3 Encounters:  06/21/14 257 lb 11.2 oz (116.892 kg)  05/28/14 257 lb 8 oz (116.8 kg)  05/28/14 257 lb 8 oz (116.8 kg)     Past Medical History  Diagnosis Date  . Hypertension   . Hypercholesteremia   . Cancer 5/10    [facility]ats   . Lung cancer   . Peripheral vascular disease 8/12    Lt SFA PTA  . Sleep apnea     " mild form ,does not use cpap "  . Diabetes mellitus     insulin dependent  . GERD (gastroesophageal reflux disease)   . H/O hiatal hernia   . Arthritis   . Non-STEMI (non-ST elevated myocardial infarction) 04/07/2012    See cath results below  . Presence of stent in LAD coronary artery 04/07/2012    Xience Expedition DES 2.75  mm x 18 mm (dilated to 3.0 mm)  . Paroxysmal atrial fibrillation 04/07/2012    On Warfarin  . Heart palpitations     monitor 04/19/12-05/10/12- frequent PAF with RVR- coumadin started    Current Outpatient Prescriptions  Medication Sig Dispense Refill  . aspirin 81 MG chewable tablet Chew 81 mg by mouth daily.      . cholecalciferol (VITAMIN D) 1000 UNITS tablet Take 1,000 Units by mouth at bedtime.       . clopidogrel (PLAVIX) 75 MG tablet Take 1 tablet (75 mg total) by mouth daily with breakfast.  30 tablet  11  . doxycycline (VIBRAMYCIN) 100 MG capsule Take 1 capsule (100 mg total) by mouth 2 (two) times daily.  120 capsule  3  . insulin NPH-regular Human (NOVOLIN 70/30) (70-30) 100 UNIT/ML injection Inject 25 Units into the skin 2 (two) times daily with a meal.      . lisinopril-hydrochlorothiazide (PRINZIDE,ZESTORETIC) 20-25 MG per tablet Take 1 tablet by mouth daily.      . metoCLOPramide (REGLAN) 10 MG tablet Take 5-10 mg by mouth 2 (two) times daily as needed (for nausea). 5 mg in the morning then 10mg  in at bedtime      . metoprolol (LOPRESSOR) 50 MG tablet Take 1 tablet (50 mg total) by mouth 2 (two) times daily.  60 tablet  5  . pantoprazole (PROTONIX) 40 MG tablet Take 1 tablet (40 mg total) by mouth daily.  30 tablet  11  . simvastatin (ZOCOR) 40 MG tablet Take 40 mg by mouth every evening.      . warfarin (COUMADIN) 7.5 MG tablet Take 3.75-7.5 mg by mouth daily. Taking 7.5 mg daily except take 3.75 mg on Monday       No current facility-administered medications for this visit.    Allergies:    Allergies  Allergen Reactions  . Levofloxacin Itching  . Morphine And Related Itching    Social History:  The patient  reports that she quit smoking about 29 years ago. Her smoking use included Cigarettes. She has a 20 pack-year smoking history. She has never used smokeless tobacco. She reports that she does not drink alcohol or use illicit drugs.   Family history:   Family History    Problem Relation Age of Onset  . Hypertension Mother   . Coronary artery disease Brother   . Hypertension Sister     ROS:  Please see the history of present illness.  All other systems reviewed and negative.   PHYSICAL EXAM: VS:  BP 140/70  Pulse 52  Ht 5\' 3"  (1.6 m)  Wt 257 lb 11.2 oz (116.892 kg)  BMI 45.66 kg/m2 Morbidly obese, well developed, in no acute distress HEENT: Pupils are equal round react to light accommodation extraocular movements are intact.  Neck: no JVDNo cervical lymphadenopathy. Cardiac: Regular rate and rhythm without murmurs rubs or gallops. Lungs:  clear to auscultation bilaterally, no wheezing, rhonchi or rales Ext: Mild Left greater than right lower extremity edema.  2+ radial and 1+ posterior tibial pulses. Skin: warm and dry Neuro:  Grossly normal  EKG:  Sinus bradycardia rate 52 beats per minute   ASSESSMENT AND PLAN:  Problem List Items Addressed This Visit   DIABETES, TYPE 2 (Chronic)   Relevant Medications      insulin NPH-regular Human (NOVOLIN 70/30) (70-30) 100 UNIT/ML injection   HYPERTENSION (Chronic)     No changes at this time. Continue Lopressor, lisinopril, HCTZ    Paroxysmal atrial fibrillation (Chronic)     Maintaining sinus bradycardia    PVD (peripheral vascular disease), Lt SFA PTA Aug 2012 (Chronic)     Not complaining of claudication. However, she is not very active    CAD, LAD DES 04/07/12 - Low risk Myoview March 2014 - Primary (Chronic)     She is on aspirin, Plavix, Coumadin. Coumadin but will be rechecked next Friday if she is therapeutic we'll discontinue aspirin. That will have been one month with triple therapy. She is not steady on her feet would not recommend continued physical therapy.    Relevant Orders      EKG 12-Lead   Morbid obesity (Chronic)     Offered referral to register dietitian. Contact information given    Relevant Medications      insulin NPH-regular Human (NOVOLIN 70/30) (70-30) 100 UNIT/ML  injection   Dyslipidemia (Chronic)     On a statin    Chronic anticoagulation (Chronic)     INR check scheduled for next Friday    ICM, EF 40-45% cath 04/07/12- improved to 50-60% by echo Oct 2013     Appears euvolemic.

## 2014-06-21 NOTE — Assessment & Plan Note (Signed)
Not complaining of claudication. However, she is not very active

## 2014-06-21 NOTE — Patient Instructions (Signed)
1.  Stop aspirin if INR is therapeutic next Friday when checked by Erasmo Downer. 2.  Follow up with Dr. [name] in 3 months.

## 2014-06-21 NOTE — Assessment & Plan Note (Signed)
She is on aspirin, Plavix, Coumadin. Coumadin but will be rechecked next Friday if she is therapeutic we'll discontinue aspirin. That will have been one month with triple therapy. She is not steady on her feet would not recommend continued physical therapy.

## 2014-06-21 NOTE — Assessment & Plan Note (Signed)
Offered referral to register dietitian. Contact information given

## 2014-06-21 NOTE — Assessment & Plan Note (Signed)
On a statin

## 2014-06-21 NOTE — Assessment & Plan Note (Signed)
INR check scheduled for next Friday

## 2014-06-21 NOTE — Assessment & Plan Note (Signed)
No changes at this time. Continue Lopressor, lisinopril, HCTZ

## 2014-06-21 NOTE — Assessment & Plan Note (Signed)
Maintaining sinus bradycardia

## 2014-06-28 ENCOUNTER — Ambulatory Visit (INDEPENDENT_AMBULATORY_CARE_PROVIDER_SITE_OTHER): Payer: Commercial Managed Care - HMO | Admitting: Pharmacist Clinician (PhC)/ Clinical Pharmacy Specialist

## 2014-06-28 DIAGNOSIS — Z7901 Long term (current) use of anticoagulants: Secondary | ICD-10-CM

## 2014-06-28 DIAGNOSIS — I480 Paroxysmal atrial fibrillation: Secondary | ICD-10-CM

## 2014-06-28 LAB — POCT INR: INR: 2.8

## 2014-07-01 DIAGNOSIS — E1065 Type 1 diabetes mellitus with hyperglycemia: Secondary | ICD-10-CM | POA: Diagnosis not present

## 2014-07-07 ENCOUNTER — Other Ambulatory Visit: Payer: Self-pay | Admitting: Cardiovascular Disease

## 2014-07-08 NOTE — Telephone Encounter (Signed)
Rx was sent to pharmacy electronically. 

## 2014-07-22 ENCOUNTER — Ambulatory Visit (INDEPENDENT_AMBULATORY_CARE_PROVIDER_SITE_OTHER): Payer: Commercial Managed Care - HMO | Admitting: Pharmacist Clinician (PhC)/ Clinical Pharmacy Specialist

## 2014-07-22 ENCOUNTER — Encounter: Payer: Self-pay | Admitting: Cardiovascular Disease

## 2014-07-22 ENCOUNTER — Ambulatory Visit (INDEPENDENT_AMBULATORY_CARE_PROVIDER_SITE_OTHER): Payer: Commercial Managed Care - HMO | Admitting: Cardiovascular Disease

## 2014-07-22 VITALS — BP 122/54 | HR 64 | Resp 20 | Ht 63.0 in | Wt 262.2 lb

## 2014-07-22 DIAGNOSIS — E782 Mixed hyperlipidemia: Secondary | ICD-10-CM

## 2014-07-22 DIAGNOSIS — I2510 Atherosclerotic heart disease of native coronary artery without angina pectoris: Secondary | ICD-10-CM

## 2014-07-22 DIAGNOSIS — I214 Non-ST elevation (NSTEMI) myocardial infarction: Secondary | ICD-10-CM

## 2014-07-22 DIAGNOSIS — I739 Peripheral vascular disease, unspecified: Secondary | ICD-10-CM

## 2014-07-22 DIAGNOSIS — I480 Paroxysmal atrial fibrillation: Secondary | ICD-10-CM

## 2014-07-22 DIAGNOSIS — I10 Essential (primary) hypertension: Secondary | ICD-10-CM

## 2014-07-22 DIAGNOSIS — R5383 Other fatigue: Secondary | ICD-10-CM

## 2014-07-22 DIAGNOSIS — Z7901 Long term (current) use of anticoagulants: Secondary | ICD-10-CM

## 2014-07-22 LAB — POCT INR: INR: 3.8

## 2014-07-22 MED ORDER — METOPROLOL TARTRATE 25 MG PO TABS
25.0000 mg | ORAL_TABLET | Freq: Two times a day (BID) | ORAL | Status: DC
Start: 2014-07-22 — End: 2015-06-25

## 2014-07-22 NOTE — Patient Instructions (Signed)
Cancel appointment with Dr. [name] in January 2016.  DECREASE Metoprolol Tartrate to 25mg  twice a day.  Your physician recommends that you return for lab work in: [place].  Dr. Sallyanne Kuster recommends that you schedule a follow-up appointment in: 6 months.

## 2014-07-23 ENCOUNTER — Encounter: Payer: Self-pay | Admitting: Cardiovascular Disease

## 2014-07-23 LAB — CBC
HCT: 36.3 % (ref 36.0–46.0)
Hemoglobin: 12.1 g/dL (ref 12.0–15.0)
MCH: 29.1 pg (ref 26.0–34.0)
MCHC: 33.3 g/dL (ref 30.0–36.0)
MCV: 87.3 fL (ref 78.0–100.0)
Platelets: 250 10*3/uL (ref 150–400)
RBC: 4.16 MIL/uL (ref 3.87–5.11)
RDW: 13.9 % (ref 11.5–15.5)
WBC: 7.4 10*3/uL (ref 4.0–10.5)

## 2014-07-23 LAB — LIPID PANEL
Cholesterol: 135 mg/dL (ref 0–200)
HDL: 46 mg/dL (ref >39)
LDL CALC: 72 mg/dL (ref 0–99)
Total CHOL/HDL Ratio: 2.9 Ratio
Triglycerides: 83 mg/dL (ref <150)
VLDL: 17 mg/dL (ref 0–40)

## 2014-07-23 NOTE — Telephone Encounter (Signed)
Patient saw Gaspar Bidding, [place] on 06/21/14 and Dr. Sallyanne Kuster on 07/22/14

## 2014-07-23 NOTE — Progress Notes (Signed)
Patient ID: [name], female   DOB: Jul 05, 1951, 63 y.o.   MRN: [id number]     Reason for office visit CAD, PAD, hypertension, hyperlipidemia  [name] returns for follow-up for extensive history of coronary prefer to prescribe disease. In 2013 she received a drug-eluting stent to the LAD artery. In July she began developing angina pectoris and a nuclear stress test led to repeat cardiac catheterization in August 2015, demonstrating high-grade in-stent restenosis treated with balloon angioplasty and placement of a new 312 mm drug-eluting Xience expedition stents to the distal edge of the previous stent. Incidental note made of a 50-60 percent mid AV groove left circumflex stenosis She has been angina free since. She has normal left ventricular systolic function and no congestive heart failure.  In addition she has history of PAD status post atherectomy of the left superficial femoral artery and paroxysmal atrial fibrillation on chronic warfarin anticoagulation. Additional comorbid conditions include hypertension, obesity, type 2 diabetes mellitus and gastroesophageal reflux disease. She has a history of left upper lobe lobectomy for adenocarcinoma of the lung with curative intent February 2010.  She is currently on clopidogrel and warfarin but is no longer taking aspirin. No overt bleeding problems. Hemoglobin checked today 12.1.  Her major complaint is fatigue. She denies shortness of breath or angina pectoris also has problems with chronic back pain. She rarely has ankle edema. Overall class II functional status  Glycemic control has deteriorated, most recent hemoglobin A1c was 7.7% her blood pressure is well controlled and she is tolerating simvastatin. Excellent lipid profile.  Allergies  Allergen Reactions  . Levofloxacin Itching  . Morphine And Related Itching    Current Outpatient Prescriptions  Medication Sig Dispense Refill  . cholecalciferol (VITAMIN D) 1000 UNITS  tablet Take 1,000 Units by mouth at bedtime.       . clopidogrel (PLAVIX) 75 MG tablet Take 1 tablet (75 mg total) by mouth daily with breakfast.  30 tablet  11  . insulin NPH-regular Human (NOVOLIN 70/30) (70-30) 100 UNIT/ML injection Inject 25 Units into the skin 2 (two) times daily with a meal.      . lisinopril-hydrochlorothiazide (PRINZIDE,ZESTORETIC) 20-25 MG per tablet Take 1 tablet by mouth daily.      . metoCLOPramide (REGLAN) 10 MG tablet Take 5-10 mg by mouth 2 (two) times daily as needed (for nausea). 5 mg in the morning then 10mg  in at bedtime      . metoprolol (LOPRESSOR) 25 MG tablet Take 1 tablet (25 mg total) by mouth 2 (two) times daily.  60 tablet  10  . pantoprazole (PROTONIX) 40 MG tablet Take 1 tablet (40 mg total) by mouth daily.  30 tablet  11  . simvastatin (ZOCOR) 40 MG tablet Take 40 mg by mouth every evening.      . warfarin (COUMADIN) 7.5 MG tablet Take 3.75-7.5 mg by mouth daily. Taking 7.5 mg daily except take 3.75 mg on Monday       No current facility-administered medications for this visit.    Past Medical History  Diagnosis Date  . Hypertension   . Hypercholesteremia   . Cancer 5/10    LUL Vats   . Lung cancer   . Peripheral vascular disease 8/12    Lt SFA PTA  . Sleep apnea     " mild form ,does not use cpap "  . Diabetes mellitus     insulin dependent  . GERD (gastroesophageal reflux disease)   . H/O hiatal hernia   .  Arthritis   . Non-STEMI (non-ST elevated myocardial infarction) 04/07/2012    See cath results below  . Presence of stent in LAD coronary artery 04/07/2012    Xience Expedition DES 2.75 mm x 18 mm (dilated to 3.0 mm)  . Paroxysmal atrial fibrillation 04/07/2012    On Warfarin  . Heart palpitations     monitor 04/19/12-05/10/12- frequent PAF with RVR- coumadin started    Past Surgical History  Procedure Laterality Date  . Abdominal hysterectomy  1960  . Cholecystectomy    . Lung surgery  5/10  . Percutaneous coronary angioplasty  and stenting  04/07/2012    Xience Expedition DES 2.79mm x 18 mm (dilated to 3.0 mm) place to the prox LAD   . Orif ankle fracture  07/09/2012    Procedure: OPEN REDUCTION INTERNAL FIXATION (ORIF) ANKLE FRACTURE;  Surgeon: Meredith Pel, MD;  Location: [facility];  Service: Orthopedics;  Laterality: Left;  open reduction internal fixation trimalleolar ankle fracture medial malleolous fixation  . Cardiac catheterization  11/04/2008    patent RCA, LM, and Circ, nl EF  . Cardiac catheterization  02/20/2002    patent coronaries with the only abnormality being a smooth luminla irregularity in the mid intermediate ramus branch no felt to be hemodynamically significant, nl LV  . Lower extremity angiogram  05/17/11    directional atherectomy to the prox L SFA using a LX Man TurboHawk, ballooned with a [employer] balloon   . Coronary angioplasty with stent placement  04/07/12    LAD  . Total knee revision Left 11/05/2013    Procedure: LEFT TOTAL KNEE RESECTION;  Surgeon: Mauri Pole, MD;  Location: [facility];  Service: Orthopedics;  Laterality: Left;  . Joint replacement      left knee 2003  . Foot surgery Bilateral 1993    with screws, on screw removed  . Knee arthroscopy Bilateral     left x2, right x1  . Total knee revision Left 2007    opened in 2006 and cleaned out, 2007 revision  . Total knee revision Left 12/31/2013    Procedure: RE-INPLANTATION LEFT TOTAL KNEE ;  Surgeon: Mauri Pole, MD;  Location: [facility];  Service: Orthopedics;  Laterality: Left;  . Cardiac catheterization  05/27/14    DES to proximal LAD    Family History  Problem Relation Age of Onset  . Hypertension Mother   . Coronary artery disease Brother   . Hypertension Sister     History   Social History  . Marital Status: Single    Spouse Name: N/A    Number of Children: N/A  . Years of Education: N/A   Occupational History  . Not on file.   Social History Main Topics  . Smoking status: Former Smoker -- 1.00  packs/day for 20 years    Types: Cigarettes    Quit date: 09/27/1984  . Smokeless tobacco: Never Used  . Alcohol Use: No  . Drug Use: No  . Sexual Activity: Not on file   Other Topics Concern  . Not on file   Social History Narrative  . No narrative on file    Review of systems: Fatigue, class II shortness of breath on exertion. The patient specifically denies any chest pain at rest or with exertion, dyspnea at rest or with exertion, orthopnea, paroxysmal nocturnal dyspnea, syncope, palpitations, focal neurological deficits, intermittent claudication, lower extremity edema, unexplained weight gain, cough, hemoptysis or wheezing.  The patient also denies abdominal pain, nausea,  vomiting, dysphagia, diarrhea, constipation, polyuria, polydipsia, dysuria, hematuria, frequency, urgency, abnormal bleeding or bruising, fever, chills, unexpected weight changes, mood swings, change in skin or hair texture, change in voice quality, auditory or visual problems, allergic reactions or rashes, new musculoskeletal complaints other than usual "aches and pains".   PHYSICAL EXAM BP 122/54  Pulse 64  Ht 5\' 3"  (1.6 m)  Wt 262 lb 3.2 oz (118.933 kg)  BMI 46.46 kg/m2  General: Alert, oriented x3, no distress Head: no evidence of trauma, PERRL, EOMI, no exophtalmos or lid lag, no myxedema, no xanthelasma; normal ears, nose and oropharynx Neck: normal jugular venous pulsations and no hepatojugular reflux; brisk carotid pulses without delay and no carotid bruits Chest: clear to auscultation, no signs of consolidation by percussion or palpation, normal fremitus, symmetrical and full respiratory excursions Cardiovascular: normal position and quality of the apical impulse, regular rhythm, normal first and second heart sounds, no murmurs, rubs or gallops Abdomen: no tenderness or distention, no masses by palpation, no abnormal pulsatility or arterial bruits, normal bowel sounds, no  hepatosplenomegaly Extremities: no clubbing, cyanosis or edema; 2+ radial, ulnar and brachial pulses bilaterally; 2+ right femoral, posterior tibial and dorsalis pedis pulses; 2+ left femoral, posterior tibial and dorsalis pedis pulses; no subclavian or femoral bruits Neurological: grossly nonfocal   Lipid Panel     Component Value Date/Time   CHOL 135 07/22/2014 1435   TRIG 83 07/22/2014 1435   HDL 46 07/22/2014 1435   CHOLHDL 2.9 07/22/2014 1435   VLDL 17 07/22/2014 1435   LDLCALC 72 07/22/2014 1435   LDLDIRECT 143* 10/10/2008 2042    BMET    Component Value Date/Time   NA 138 05/28/2014 0419   NA 139 09/12/2013 1005   NA 134 09/14/2010 0939   K 4.2 05/28/2014 0419   K 3.8 09/12/2013 1005   K 3.9 09/14/2010 0939   CL 100 05/28/2014 0419   CL 99 09/12/2012 0959   CL 96* 09/14/2010 0939   CO2 24 05/28/2014 0419   CO2 29 09/12/2013 1005   CO2 31 09/14/2010 0939   GLUCOSE 276* 05/28/2014 0419   GLUCOSE 230* 09/12/2013 1005   GLUCOSE 217* 09/12/2012 0959   GLUCOSE 277* 09/14/2010 0939   BUN 15 05/28/2014 0419   BUN 11.6 09/12/2013 1005   BUN 14 09/14/2010 0939   CREATININE 0.90 05/28/2014 0419   CREATININE 0.91 05/23/2014 1515   CREATININE 0.9 09/12/2013 1005   CALCIUM 9.3 05/28/2014 0419   CALCIUM 9.9 09/12/2013 1005   CALCIUM 9.3 09/14/2010 0939   GFRNONAA 66* 05/28/2014 0419   GFRAA 77* 05/28/2014 0419     ASSESSMENT AND PLAN  [name] is at substantial risk for recurrent in-stent restenosis in the LAD artery in the setting of uncontrolled diabetes mellitus, previous restenosis and a relatively small caliber vessel. Aggressive risk factor modification is in order. Her lipid profile is excellent, but glucose control needs to improve. Hemoglobin checked today because of her complaints of fatigue was essentially normal. She'll continue on combination clopidogrel and warfarin for at least 12 months following the stent. Cannot exclude beta blocker-related fatigue, but I believe this medication  is important for her. We'll reduce the dose in half.  Patient Instructions   DECREASE Metoprolol Tartrate to 25mg  twice a day.  Dr. Sallyanne Kuster recommends that you schedule a follow-up appointment in: 6 months.        Orders Placed This Encounter  Procedures  . CBC  . Lipid panel   Meds ordered this  encounter  Medications  . metoprolol (LOPRESSOR) 25 MG tablet    Sig: Take 1 tablet (25 mg total) by mouth 2 (two) times daily.    Dispense:  60 tablet    Refill:  [address]  Sanda Klein, MD, [facility] [phone] office [phone] pager

## 2014-07-29 ENCOUNTER — Encounter: Payer: Self-pay | Admitting: Cardiovascular Disease

## 2014-08-05 ENCOUNTER — Ambulatory Visit (INDEPENDENT_AMBULATORY_CARE_PROVIDER_SITE_OTHER): Payer: Commercial Managed Care - HMO | Admitting: Pharmacist Clinician (PhC)/ Clinical Pharmacy Specialist

## 2014-08-05 DIAGNOSIS — Z7901 Long term (current) use of anticoagulants: Secondary | ICD-10-CM

## 2014-08-05 DIAGNOSIS — I480 Paroxysmal atrial fibrillation: Secondary | ICD-10-CM

## 2014-08-05 LAB — POCT INR: INR: 3.6

## 2014-08-21 ENCOUNTER — Telehealth: Payer: Self-pay | Admitting: Cardiovascular Disease

## 2014-08-21 NOTE — Telephone Encounter (Signed)
Close encounter 

## 2014-08-26 ENCOUNTER — Ambulatory Visit: Payer: Commercial Managed Care - HMO | Admitting: Pharmacist Clinician (PhC)/ Clinical Pharmacy Specialist

## 2014-08-28 ENCOUNTER — Other Ambulatory Visit: Payer: Self-pay | Admitting: Cardiovascular Disease

## 2014-08-30 ENCOUNTER — Ambulatory Visit (INDEPENDENT_AMBULATORY_CARE_PROVIDER_SITE_OTHER): Payer: Commercial Managed Care - HMO | Admitting: Pharmacist Clinician (PhC)/ Clinical Pharmacy Specialist

## 2014-08-30 DIAGNOSIS — I480 Paroxysmal atrial fibrillation: Secondary | ICD-10-CM

## 2014-08-30 DIAGNOSIS — Z7901 Long term (current) use of anticoagulants: Secondary | ICD-10-CM

## 2014-08-30 LAB — POCT INR: INR: 2.6

## 2014-09-05 ENCOUNTER — Encounter (HOSPITAL_COMMUNITY): Payer: Self-pay | Admitting: Cardiovascular Disease

## 2014-09-10 ENCOUNTER — Other Ambulatory Visit (HOSPITAL_BASED_OUTPATIENT_CLINIC_OR_DEPARTMENT_OTHER): Payer: Commercial Managed Care - HMO

## 2014-09-10 ENCOUNTER — Ambulatory Visit (HOSPITAL_COMMUNITY)
Admission: RE | Admit: 2014-09-10 | Discharge: 2014-09-10 | Disposition: A | Discharge status: Home / Self Care | Payer: Commercial Managed Care - HMO | Source: Ambulatory Visit | Attending: Internal Medicine | Admitting: Internal Medicine

## 2014-09-10 ENCOUNTER — Encounter (HOSPITAL_COMMUNITY): Payer: Self-pay

## 2014-09-10 DIAGNOSIS — Z48813 Encounter for surgical aftercare following surgery on the respiratory system: Secondary | ICD-10-CM | POA: Diagnosis not present

## 2014-09-10 DIAGNOSIS — Z85118 Personal history of other malignant neoplasm of bronchus and lung: Secondary | ICD-10-CM | POA: Diagnosis not present

## 2014-09-10 DIAGNOSIS — C3490 Malignant neoplasm of unspecified part of unspecified bronchus or lung: Secondary | ICD-10-CM

## 2014-09-10 LAB — COMPREHENSIVE METABOLIC PANEL (CC13)
ALT: 15 U/L (ref 0–55)
AST: 15 U/L (ref 5–34)
Albumin: 3.3 g/dL — ABNORMAL LOW (ref 3.5–5.0)
Alkaline Phosphatase: 118 U/L (ref 40–150)
Anion Gap: 12 mEq/L — ABNORMAL HIGH (ref 3–11)
BUN: 15.6 mg/dL (ref 7.0–26.0)
CO2: 27 mEq/L (ref 22–29)
Calcium: 9.8 mg/dL (ref 8.4–10.4)
Chloride: 99 mEq/L (ref 98–109)
Creatinine: 1 mg/dL (ref 0.6–1.1)
EGFR: 67 mL/min/{1.73_m2} — AB (ref >90)
GLUCOSE: 191 mg/dL — AB (ref 70–140)
POTASSIUM: 4.1 meq/L (ref 3.5–5.1)
Sodium: 139 mEq/L (ref 136–145)
Total Bilirubin: 0.44 mg/dL (ref 0.20–1.20)
Total Protein: 7.8 g/dL (ref 6.4–8.3)

## 2014-09-10 LAB — CBC WITH DIFFERENTIAL/PLATELET
BASO%: 0.4 % (ref 0.0–2.0)
Basophils Absolute: 0 10*3/uL (ref 0.0–0.1)
EOS ABS: 0.2 10*3/uL (ref 0.0–0.5)
EOS%: 2.7 % (ref 0.0–7.0)
HCT: 39 % (ref 34.8–46.6)
HGB: 12.8 g/dL (ref 11.6–15.9)
LYMPH%: 22 % (ref 14.0–49.7)
MCH: 28.7 pg (ref 25.1–34.0)
MCHC: 32.9 g/dL (ref 31.5–36.0)
MCV: 87.1 fL (ref 79.5–101.0)
MONO#: 0.5 10*3/uL (ref 0.1–0.9)
MONO%: 7.1 % (ref 0.0–14.0)
NEUT%: 67.8 % (ref 38.4–76.8)
NEUTROS ABS: 4.9 10*3/uL (ref 1.5–6.5)
Platelets: 243 10*3/uL (ref 145–400)
RBC: 4.47 10*6/uL (ref 3.70–5.45)
RDW: 13.2 % (ref 11.2–14.5)
WBC: 7.2 10*3/uL (ref 3.9–10.3)
lymph#: 1.6 10*3/uL (ref 0.9–3.3)

## 2014-09-17 ENCOUNTER — Encounter: Payer: Self-pay | Admitting: Internal Medicine

## 2014-09-17 ENCOUNTER — Telehealth: Payer: Self-pay | Admitting: Internal Medicine

## 2014-09-17 ENCOUNTER — Ambulatory Visit (HOSPITAL_BASED_OUTPATIENT_CLINIC_OR_DEPARTMENT_OTHER): Payer: Commercial Managed Care - HMO | Admitting: Internal Medicine

## 2014-09-17 VITALS — BP 127/58 | HR 71 | Temp 97.9°F | Resp 18 | Ht 63.0 in | Wt 268.5 lb

## 2014-09-17 DIAGNOSIS — Z85118 Personal history of other malignant neoplasm of bronchus and lung: Secondary | ICD-10-CM

## 2014-09-17 DIAGNOSIS — C3492 Malignant neoplasm of unspecified part of left bronchus or lung: Secondary | ICD-10-CM

## 2014-09-17 NOTE — Telephone Encounter (Signed)
gv adn printed appt sched and avs for pt for DEC 2016

## 2014-09-17 NOTE — Progress Notes (Signed)
[place] Telephone:(336) [phone]   Fax:(336) [place] [address]  DIAGNOSIS: : Stage IA (T1b N0 MX) non-small cell lung cancer, adenocarcinoma diagnosed in February 2010.   PRIOR THERAPY: Status post left upper lobectomy with node dissection under the care of Dr. Arlyce Dice on Feb 11, 2009.   CURRENT THERAPY: Observation.  INTERVAL HISTORY: [name] 63 y.o. female returns to the clinic today for routine annual followup visit. The patient is doing fine today with no specific complaints. She had a cardiac stent placed a few months ago and she is currently on Coumadin and Plavix. She is feeling better today with no significant weight loss or night sweats. She denied having any significant chest pain, shortness of breath, cough or hemoptysis. She had repeat CT scan of the chest performed recently and she is here for evaluation and discussion of her scan results.  MEDICAL HISTORY: Past Medical History  Diagnosis Date  . Hypertension   . Hypercholesteremia   . Cancer 5/10    LUL Vats   . Lung cancer   . Peripheral vascular disease 8/12    Lt SFA PTA  . Sleep apnea     " mild form ,does not use cpap "  . Diabetes mellitus     insulin dependent  . GERD (gastroesophageal reflux disease)   . H/O hiatal hernia   . Arthritis   . Non-STEMI (non-ST elevated myocardial infarction) 04/07/2012    See cath results below  . Presence of stent in LAD coronary artery 04/07/2012    Xience Expedition DES 2.75 mm x 18 mm (dilated to 3.0 mm)  . Paroxysmal atrial fibrillation 04/07/2012    On Warfarin  . Heart palpitations     monitor 04/19/12-05/10/12- frequent PAF with RVR- coumadin started    ALLERGIES:  is allergic to levofloxacin and morphine and related.  MEDICATIONS:  Current Outpatient Prescriptions  Medication Sig Dispense Refill  . cholecalciferol (VITAMIN D) 1000 UNITS  tablet Take 1,000 Units by mouth at bedtime.     . clopidogrel (PLAVIX) 75 MG tablet Take 1 tablet (75 mg total) by mouth daily with breakfast. 30 tablet 11  . insulin NPH-regular Human (NOVOLIN 70/30) (70-30) 100 UNIT/ML injection Inject 25 Units into the skin 2 (two) times daily with a meal.    . lisinopril-hydrochlorothiazide (PRINZIDE,ZESTORETIC) 20-25 MG per tablet Take 1 tablet by mouth daily.    . metoCLOPramide (REGLAN) 10 MG tablet Take 5-10 mg by mouth 2 (two) times daily as needed (for nausea). 5 mg in the morning then 10mg  in at bedtime    . metoprolol (LOPRESSOR) 25 MG tablet Take 1 tablet (25 mg total) by mouth 2 (two) times daily. 60 tablet 10  . pantoprazole (PROTONIX) 40 MG tablet Take 1 tablet (40 mg total) by mouth daily. 30 tablet 11  . simvastatin (ZOCOR) 40 MG tablet Take 40 mg by mouth every evening.    . warfarin (COUMADIN) 7.5 MG tablet Take 3.75-7.5 mg by mouth daily. Taking 7.5 mg daily except take 3.75 mg on Monday and Friday     No current facility-administered medications for this visit.    SURGICAL HISTORY:  Past Surgical History  Procedure Laterality Date  . Abdominal hysterectomy  1960  . Cholecystectomy    . Lung surgery  5/10  . Percutaneous coronary angioplasty and stenting  04/07/2012    Xience Expedition DES 2.43mm x  18 mm (dilated to 3.0 mm) place to the prox LAD   . Orif ankle fracture  07/09/2012    Procedure: OPEN REDUCTION INTERNAL FIXATION (ORIF) ANKLE FRACTURE;  Surgeon: Meredith Pel, MD;  Location: [facility];  Service: Orthopedics;  Laterality: Left;  open reduction internal fixation trimalleolar ankle fracture medial malleolous fixation  . Cardiac catheterization  11/04/2008    patent RCA, LM, and Circ, nl EF  . Cardiac catheterization  02/20/2002    patent coronaries with the only abnormality being a smooth luminla irregularity in the mid intermediate ramus branch no felt to be hemodynamically significant, nl LV  . Lower extremity angiogram   05/17/11    directional atherectomy to the prox L SFA using a LX Man TurboHawk, ballooned with a [employer] balloon   . Coronary angioplasty with stent placement  04/07/12    LAD  . Total knee revision Left 11/05/2013    Procedure: LEFT TOTAL KNEE RESECTION;  Surgeon: Mauri Pole, MD;  Location: [facility];  Service: Orthopedics;  Laterality: Left;  . Joint replacement      left knee 2003  . Foot surgery Bilateral 1993    with screws, on screw removed  . Knee arthroscopy Bilateral     left x2, right x1  . Total knee revision Left 2007    opened in 2006 and cleaned out, 2007 revision  . Total knee revision Left 12/31/2013    Procedure: RE-INPLANTATION LEFT TOTAL KNEE ;  Surgeon: Mauri Pole, MD;  Location: [facility];  Service: Orthopedics;  Laterality: Left;  . Cardiac catheterization  05/27/14    DES to proximal LAD  . Left heart catheterization with coronary angiogram N/A 04/07/2012    Procedure: LEFT HEART CATHETERIZATION WITH CORONARY ANGIOGRAM;  Surgeon: [name], MD;  Location: [facility] CATH LAB;  Service: Cardiovascular;  Laterality: N/A;  . Percutaneous coronary stent intervention (pci-s) N/A 04/07/2012    Procedure: PERCUTANEOUS CORONARY STENT INTERVENTION (PCI-S);  Surgeon: [name], MD;  Location[facility];  Service: Cardiovascular;  Laterality: N/A;  . Left heart catheterization with coronary angiogram N/A 05/27/2014    Procedure: LEFT HEART CATHETERIZATION WITH CORONARY ANGIOGRAM;  Surgeon: [name], MD;  Location: [facility] CATH LAB;  Service: Cardiovascular;  Laterality: N/A;  . Percutaneous coronary stent intervention (pci-s)  05/27/2014    Procedure: PERCUTANEOUS CORONARY STENT INTERVENTION (PCI-S);  Surgeon: [name], MD;  Location: [facility] CATH LAB;  Service: Cardiovascular;;  prox lad    REVIEW OF SYSTEMS:  A comprehensive review of systems was negative except for: Constitutional: positive for fatigue   PHYSICAL EXAMINATION: General appearance: alert, cooperative  and no distress Head: Normocephalic, without obvious abnormality, atraumatic Neck: no adenopathy Resp: clear to auscultation bilaterally Cardio: regular rate and rhythm, S1, S2 normal, no murmur, click, rub or gallop GI: soft, non-tender; bowel sounds normal; no masses,  no organomegaly Extremities: extremities normal, atraumatic, no cyanosis or edema  ECOG PERFORMANCE STATUS: 1 - Symptomatic but completely ambulatory  Blood pressure 127/58, pulse 71, temperature 97.9 F (36.6 C), temperature source Oral, resp. rate 18, height 5\' 3"  (1.6 m), weight 268 lb 8 oz (121.791 kg), SpO2 97 %.  LABORATORY DATA: Lab Results  Component Value Date   WBC 7.2 09/10/2014   HGB 12.8 09/10/2014   HCT 39.0 09/10/2014   MCV 87.1 09/10/2014   PLT 243 09/10/2014      Chemistry      Component Value Date/Time   NA 139  09/10/2014 0958   NA 138 05/28/2014 0419   NA 134 09/14/2010 0939   K 4.1 09/10/2014 0958   K 4.2 05/28/2014 0419   K 3.9 09/14/2010 0939   CL 100 05/28/2014 0419   CL 99 09/12/2012 0959   CL 96* 09/14/2010 0939   CO2 27 09/10/2014 0958   CO2 24 05/28/2014 0419   CO2 31 09/14/2010 0939   BUN 15.6 09/10/2014 0958   BUN 15 05/28/2014 0419   BUN 14 09/14/2010 0939   CREATININE 1.0 09/10/2014 0958   CREATININE 0.90 05/28/2014 0419   CREATININE 0.91 05/23/2014 1515      Component Value Date/Time   CALCIUM 9.8 09/10/2014 0958   CALCIUM 9.3 05/28/2014 0419   CALCIUM 9.3 09/14/2010 0939   ALKPHOS 118 09/10/2014 0958   ALKPHOS 81 09/09/2011 0958   ALKPHOS 93* 09/14/2010 0939   AST 15 09/10/2014 0958   AST 17 09/09/2011 0958   AST 23 09/14/2010 0939   ALT 15 09/10/2014 0958   ALT 11 09/09/2011 0958   ALT 18 09/14/2010 0939   BILITOT 0.44 09/10/2014 0958   BILITOT 0.5 09/09/2011 0958   BILITOT 0.60 09/14/2010 0939       RADIOGRAPHIC STUDIES: Ct Chest Wo Contrast  09/10/2014   CLINICAL DATA:  Lung cancer diagnosed 2010.  Partial lung resection.  EXAM: CT CHEST WITHOUT  CONTRAST  TECHNIQUE: Multidetector CT imaging of the chest was performed following the standard protocol without IV contrast.  COMPARISON:  09/12/2013  FINDINGS: Stable postoperative changes in the left upper hemithorax. Scarring noted in the left lung base. No suspicious pulmonary nodules. No pleural effusions.  Heart is mildly enlarged. Dense coronary artery calcifications noted. Cardiac stent noted in the left anterior descending coronary artery. Aorta is normal caliber.  Stable AP window lymph node measuring 11 mm compared with 10 mm previously. No new or enlarging mediastinal, hilar or axillary lymph nodes. Chest wall soft tissues are unremarkable. Imaging into the upper abdomen shows no acute findings. Small hiatal hernia.  No acute bony abnormality or focal bone lesion.  IMPRESSION: Stable exam. No acute findings. No evidence of recurrent or metastatic disease.   Electronically Signed   By: Rolm Baptise M.D.   On: 09/10/2014 11:22   ASSESSMENT AND PLAN: This is a very pleasant 63 years old [place] American female with history of stage IA non-small cell lung cancer status post left upper lobectomy with lymph node dissection and has been observation since May of 2010. The recent CT scan of the chest showed no evidence for disease recurrence or metastatic disease. I discussed the scan results with the patient today. I recommended for her to continue on observation with repeat CT scan of the chest without contrast in one year.  She was advised to call me immediately if she has any concerning symptoms in the interval.  All questions were answered. The patient knows to call the clinic with any problems, questions or concerns. We can certainly see the patient much sooner if necessary.  Disclaimer: This note was dictated with voice recognition software. Similar sounding words can inadvertently be transcribed and may be missed upon review.

## 2014-09-25 ENCOUNTER — Ambulatory Visit (INDEPENDENT_AMBULATORY_CARE_PROVIDER_SITE_OTHER): Payer: Commercial Managed Care - HMO | Admitting: Pharmacist Clinician (PhC)/ Clinical Pharmacy Specialist

## 2014-09-25 DIAGNOSIS — Z7901 Long term (current) use of anticoagulants: Secondary | ICD-10-CM

## 2014-09-25 DIAGNOSIS — I480 Paroxysmal atrial fibrillation: Secondary | ICD-10-CM

## 2014-09-25 LAB — POCT INR: INR: 2.3

## 2014-10-04 DIAGNOSIS — E1065 Type 1 diabetes mellitus with hyperglycemia: Secondary | ICD-10-CM | POA: Diagnosis not present

## 2014-10-04 DIAGNOSIS — M159 Polyosteoarthritis, unspecified: Secondary | ICD-10-CM | POA: Diagnosis not present

## 2014-10-04 DIAGNOSIS — I482 Chronic atrial fibrillation: Secondary | ICD-10-CM | POA: Diagnosis not present

## 2014-10-23 ENCOUNTER — Ambulatory Visit: Payer: Commercial Managed Care - HMO | Admitting: Pharmacist Clinician (PhC)/ Clinical Pharmacy Specialist

## 2014-10-30 ENCOUNTER — Ambulatory Visit (INDEPENDENT_AMBULATORY_CARE_PROVIDER_SITE_OTHER): Payer: Commercial Managed Care - HMO | Admitting: Pharmacist Clinician (PhC)/ Clinical Pharmacy Specialist

## 2014-10-30 DIAGNOSIS — I480 Paroxysmal atrial fibrillation: Secondary | ICD-10-CM

## 2014-10-30 DIAGNOSIS — Z7901 Long term (current) use of anticoagulants: Secondary | ICD-10-CM

## 2014-10-30 LAB — POCT INR: INR: 2.3

## 2014-11-15 DIAGNOSIS — H52203 Unspecified astigmatism, bilateral: Secondary | ICD-10-CM | POA: Diagnosis not present

## 2014-11-15 DIAGNOSIS — Z961 Presence of intraocular lens: Secondary | ICD-10-CM | POA: Diagnosis not present

## 2014-11-15 DIAGNOSIS — E11349 Type 2 diabetes mellitus with severe nonproliferative diabetic retinopathy without macular edema: Secondary | ICD-10-CM | POA: Diagnosis not present

## 2014-11-28 DIAGNOSIS — E1065 Type 1 diabetes mellitus with hyperglycemia: Secondary | ICD-10-CM | POA: Diagnosis not present

## 2014-11-29 ENCOUNTER — Telehealth: Payer: Self-pay | Admitting: Internal Medicine

## 2014-11-29 NOTE — Telephone Encounter (Signed)
[place] F/U FROM 12/26 TO 12/27. S/W PT SHE IS AWARE - OTHER APPTS REMAIN THE SAME.

## 2014-12-11 ENCOUNTER — Ambulatory Visit (INDEPENDENT_AMBULATORY_CARE_PROVIDER_SITE_OTHER): Payer: Commercial Managed Care - HMO | Admitting: Pharmacist Clinician (PhC)/ Clinical Pharmacy Specialist

## 2014-12-11 DIAGNOSIS — Z7901 Long term (current) use of anticoagulants: Secondary | ICD-10-CM | POA: Diagnosis not present

## 2014-12-11 DIAGNOSIS — I480 Paroxysmal atrial fibrillation: Secondary | ICD-10-CM

## 2014-12-11 LAB — POCT INR: INR: 2.2

## 2014-12-26 ENCOUNTER — Other Ambulatory Visit: Payer: Self-pay | Admitting: Cardiovascular Disease

## 2015-01-06 ENCOUNTER — Other Ambulatory Visit: Payer: Self-pay | Admitting: Cardiovascular Disease

## 2015-01-20 ENCOUNTER — Ambulatory Visit (INDEPENDENT_AMBULATORY_CARE_PROVIDER_SITE_OTHER): Payer: Commercial Managed Care - HMO | Admitting: Pharmacist Clinician (PhC)/ Clinical Pharmacy Specialist

## 2015-01-20 ENCOUNTER — Ambulatory Visit (INDEPENDENT_AMBULATORY_CARE_PROVIDER_SITE_OTHER): Payer: Commercial Managed Care - HMO | Admitting: Cardiovascular Disease

## 2015-01-20 ENCOUNTER — Encounter: Payer: Self-pay | Admitting: Cardiovascular Disease

## 2015-01-20 VITALS — BP 120/60 | HR 62 | Ht 63.5 in | Wt 276.9 lb

## 2015-01-20 DIAGNOSIS — I480 Paroxysmal atrial fibrillation: Secondary | ICD-10-CM | POA: Diagnosis not present

## 2015-01-20 DIAGNOSIS — E785 Hyperlipidemia, unspecified: Secondary | ICD-10-CM

## 2015-01-20 DIAGNOSIS — I739 Peripheral vascular disease, unspecified: Secondary | ICD-10-CM | POA: Diagnosis not present

## 2015-01-20 DIAGNOSIS — Z7901 Long term (current) use of anticoagulants: Secondary | ICD-10-CM

## 2015-01-20 DIAGNOSIS — I2510 Atherosclerotic heart disease of native coronary artery without angina pectoris: Secondary | ICD-10-CM | POA: Diagnosis not present

## 2015-01-20 DIAGNOSIS — I10 Essential (primary) hypertension: Secondary | ICD-10-CM | POA: Diagnosis not present

## 2015-01-20 LAB — POCT INR: INR: 2.6

## 2015-01-20 NOTE — Progress Notes (Signed)
Patient ID: [name], female   DOB: May 04, 1951, 64 y.o.   MRN: [id number] .     Cardiology Office Note   Date:  01/20/2015   ID:  [name], DOB Oct 13, 1950, MRN [id number]  PCP:  [name], MD  Cardiologist:   Brandi Klein, MD   Chief Complaint  Patient presents with  . ROV 6 months    patient c/o shortness of breath on exertion, left ankle swelling.      History of Present Illness: [name] is a 64 y.o. female who presents for CAD, PAD, hypertension, hyperlipidemia  [name] returns for follow-up for extensive history of CAD and PAD. In 2013 she received a drug-eluting stent to the LAD artery. In [id number] she had high-grade in-stent restenosis treated with balloon angioplasty and placement of a new 312 mm drug-eluting Xience expedition stent to the distal edge of the previous stent. Incidental note made of a 50-60 percent mid AV groove left circumflex stenosis She has been angina free since. She has normal left ventricular systolic function and no congestive heart failure.  In addition she has history of PAD status post atherectomy of the left superficial femoral artery and paroxysmal atrial fibrillation (on chronic warfarin anticoagulation). Additional comorbid conditions include hypertension, obesity, type 2 diabetes mellitus and gastroesophageal reflux disease. She has a history of left upper lobe lobectomy for adenocarcinoma of the lung with curative intent February 2010.  She is currently on clopidogrel and warfarin but is no longer taking aspirin. No overt bleeding problems. She was switched to Protonix due to the risk of omeprazole interaction with clopidogrel, but has no relief of her symptoms from Protonix and wants to switch back to omeprazole  She denies shortness of breath or angina pectoris. Mostly has problems with chronic knee and back pain. She rarely has ankle edema. Overall class II functional status.  Glycemic control has  deteriorated, most recent hemoglobin A1c was 7.7% her blood pressure is well controlled and she is tolerating simvastatin. Excellent lipid profile.    Past Medical History  Diagnosis Date  . Hypertension   . Hypercholesteremia   . Cancer 5/10    LUL Vats   . Lung cancer   . Peripheral vascular disease 8/12    Lt SFA PTA  . Sleep apnea     " mild form ,does not use cpap "  . Diabetes mellitus     insulin dependent  . GERD (gastroesophageal reflux disease)   . H/O hiatal hernia   . Arthritis   . Non-STEMI (non-ST elevated myocardial infarction) 04/07/2012    See cath results below  . Presence of stent in LAD coronary artery 04/07/2012    Xience Expedition DES 2.75 mm x 18 mm (dilated to 3.0 mm)  . Paroxysmal atrial fibrillation 04/07/2012    On Warfarin  . Heart palpitations     monitor 04/19/12-05/10/12- frequent PAF with RVR- coumadin started    Past Surgical History  Procedure Laterality Date  . Abdominal hysterectomy  1960  . Cholecystectomy    . Lung surgery  5/10  . Percutaneous coronary angioplasty and stenting  04/07/2012    Xience Expedition DES 2.47m x 18 mm (dilated to 3.0 mm) place to the prox LAD   . Orif ankle fracture  07/09/2012    Procedure: OPEN REDUCTION INTERNAL FIXATION (ORIF) ANKLE FRACTURE;  Surgeon: GMeredith Pel MD;  Location: [facility];  Service: Orthopedics;  Laterality: Left;  open reduction internal fixation trimalleolar ankle fracture medial malleolous  fixation  . Cardiac catheterization  11/04/2008    patent RCA, LM, and Circ, nl EF  . Cardiac catheterization  02/20/2002    patent coronaries with the only abnormality being a smooth luminla irregularity in the mid intermediate ramus branch no felt to be hemodynamically significant, nl LV  . Lower extremity angiogram  05/17/11    directional atherectomy to the prox L SFA using a LX Man TurboHawk, ballooned with a [employer] balloon   . Coronary angioplasty with stent placement  04/07/12    LAD  .  Total knee revision Left 11/05/2013    Procedure: LEFT TOTAL KNEE RESECTION;  Surgeon: Mauri Pole, MD;  Location: [facility];  Service: Orthopedics;  Laterality: Left;  . Joint replacement      left knee 2003  . Foot surgery Bilateral 1993    with screws, on screw removed  . Knee arthroscopy Bilateral     left x2, right x1  . Total knee revision Left 2007    opened in 2006 and cleaned out, 2007 revision  . Total knee revision Left 12/31/2013    Procedure: RE-INPLANTATION LEFT TOTAL KNEE ;  Surgeon: Mauri Pole, MD;  Location: [facility];  Service: Orthopedics;  Laterality: Left;  . Cardiac catheterization  05/27/14    DES to proximal LAD  . Left heart catheterization with coronary angiogram N/A 04/07/2012    Procedure: LEFT HEART CATHETERIZATION WITH CORONARY ANGIOGRAM;  Surgeon: [name], MD;  Location: [facility] CATH LAB;  Service: Cardiovascular;  Laterality: N/A;  . Percutaneous coronary stent intervention (pci-s) N/A 04/07/2012    Procedure: PERCUTANEOUS CORONARY STENT INTERVENTION (PCI-S);  Surgeon: [name], MD;  Location: [facility] CATH LAB;  Service: Cardiovascular;  Laterality: N/A;  . Left heart catheterization with coronary angiogram N/A 05/27/2014    Procedure: LEFT HEART CATHETERIZATION WITH CORONARY ANGIOGRAM;  Surgeon: [name], MD;  Location: [facility] CATH LAB;  Service: Cardiovascular;  Laterality: N/A;  . Percutaneous coronary stent intervention (pci-s)  05/27/2014    Procedure: PERCUTANEOUS CORONARY STENT INTERVENTION (PCI-S);  Surgeon: [name], MD;  Location: [facility] CATH LAB;  Service: Cardiovascular;;  prox lad     Current Outpatient Prescriptions  Medication Sig Dispense Refill  . cholecalciferol (VITAMIN D) 1000 UNITS tablet Take 1,000 Units by mouth at bedtime.     . clopidogrel (PLAVIX) 75 MG tablet Take 1 tablet (75 mg total) by mouth daily with breakfast. 30 tablet 11  . insulin NPH-regular Human (NOVOLIN 70/30) (70-30) 100 UNIT/ML injection Inject 25 Units  into the skin 2 (two) times daily with a meal.    . lisinopril-hydrochlorothiazide (PRINZIDE,ZESTORETIC) 20-25 MG per tablet Take 1 tablet by mouth daily.    . metoCLOPramide (REGLAN) 10 MG tablet Take 5-10 mg by mouth 2 (two) times daily as needed (for nausea). 5 mg in the morning then '10mg'$  in at bedtime    . metoprolol (LOPRESSOR) 25 MG tablet Take 1 tablet (25 mg total) by mouth 2 (two) times daily. 60 tablet 10  . pantoprazole (PROTONIX) 40 MG tablet Take 1 tablet (40 mg total) by mouth daily. 30 tablet 11  . simvastatin (ZOCOR) 40 MG tablet Take 40 mg by mouth every evening.    . warfarin (COUMADIN) 7.5 MG tablet Take 1 tablet by mouth daily or as directed by coumadin clinic (Patient taking differently: Take 1 tablet by mouth daily or as directed by coumadin clinic. Takes 1 tablet (7.5 mg total) by mouth daily except for Mondays  and Fridays she takes 0.5 tablet (3.25 mg total).) 30 tablet 3   No current facility-administered medications for this visit.    Allergies:   Levofloxacin and Morphine and related    Social History:  The patient  reports that she quit smoking about 30 years ago. Her smoking use included Cigarettes. She has a 20 pack-year smoking history. She has never used smokeless tobacco. She reports that she does not drink alcohol or use illicit drugs.   Family History:  The patient's family history includes Coronary artery disease in her brother; Hypertension in her mother and sister.    ROS:  Please see the history of present illness.    Otherwise, review of systems positive for knee pain, fatigue, class II shortness of breath on exertion. The patient specifically denies any chest pain at rest or with exertion, dyspnea at rest or with exertion, orthopnea, paroxysmal nocturnal dyspnea, syncope, palpitations, focal neurological deficits, intermittent claudication, lower extremity edema, unexplained weight gain, cough, hemoptysis or wheezing.  The patient also denies abdominal  pain, nausea, vomiting, dysphagia, diarrhea, constipation, polyuria, polydipsia, dysuria, hematuria, frequency, urgency, abnormal bleeding or bruising, fever, chills, unexpected weight changes, mood swings, change in skin or hair texture, change in voice quality, auditory or visual problems, allergic reactions or rashes, new musculoskeletal complaints other than usual "aches and pains". All other systems are reviewed and negative.    PHYSICAL EXAM: VS:  BP 120/60 mmHg  Pulse 62  Ht 5' 3.5" (1.613 m)  Wt 276 lb 14.4 oz (125.601 kg)  BMI 48.28 kg/m2 , BMI Body mass index is 48.28 kg/(m^2).  General: Alert, oriented x3, no distress Head: no evidence of trauma, PERRL, EOMI, no exophtalmos or lid lag, no myxedema, no xanthelasma; normal ears, nose and oropharynx Neck: normal jugular venous pulsations and no hepatojugular reflux; brisk carotid pulses without delay and no carotid bruits Chest: clear to auscultation, no signs of consolidation by percussion or palpation, normal fremitus, symmetrical and full respiratory excursions Cardiovascular: normal position and quality of the apical impulse, regular rhythm, normal first and second heart sounds, no murmurs, rubs or gallops Abdomen: no tenderness or distention, no masses by palpation, no abnormal pulsatility or arterial bruits, normal bowel sounds, no hepatosplenomegaly Extremities: no clubbing, cyanosis or edema; 2+ radial, ulnar and brachial pulses bilaterally; 2+ right femoral, posterior tibial and dorsalis pedis pulses; 2+ left femoral, posterior tibial and dorsalis pedis pulses; no subclavian or femoral bruits Neurological: grossly nonfocal Psych: euthymic mood, full affect   EKG:  EKG is ordered today. The ekg ordered today demonstrates NSR, borderline LVH, nonspecific lateral T wave changes   Recent Labs: 05/23/2014: TSH 1.125 09/10/2014: ALT 15; BUN 15.6; Creatinine 1.0; Hemoglobin 12.8; Platelets 243; Potassium 4.1; Sodium 139     Lipid Panel    Component Value Date/Time   CHOL 135 07/22/2014 1435   TRIG 83 07/22/2014 1435   HDL 46 07/22/2014 1435   CHOLHDL 2.9 07/22/2014 1435   VLDL 17 07/22/2014 1435   LDLCALC 72 07/22/2014 1435   LDLDIRECT 143* 10/10/2008 2042      Wt Readings from Last 3 Encounters:  01/20/15 276 lb 14.4 oz (125.601 kg)  09/17/14 268 lb 8 oz (121.791 kg)  07/22/14 262 lb 3.2 oz (118.933 kg)     ASSESSMENT AND PLAN:  1.  CAD s/p LAD DES August 2016 - substantial risk for recurrent in-stent restenosis in the LAD artery in the setting of uncontrolled diabetes mellitus, previous restenosis and a relatively small caliber vessel.  Plan to stop clopidogrel in August  2. Hyperlipidemia - lipid profile is excellent, but DM/glucose control needs to improve.   3. Beta blocker-related fatigue, improved after we reduced the dose in half.  4. PAD (followed by Dr. [name]) - asymptomatic (but sedentary due to knee arthritis, possibly covering up claudication)  5. Paroxysmal atrial fibrillation  On warfarin   Current medicines are reviewed at length with the patient today.  The patient has concerns regarding medicines. No relief from pantoprazole. Over 6 months have passed since DES. OK to switch back to omeprazole.  The following changes have been made:  Omeprazole 40 mg daily instead of protonix  Labs/ tests ordered today include:  No orders of the defined types were placed in this encounter.    Patient Instructions  Dr Sallyanne Kuster recommends that you schedule a follow-up appointment in 5 months. You will receive a reminder letter in the mail two months in advance. If you don't receive a letter, please call our office to schedule the follow-up appointment.     Brandi Spray, MD  01/20/2015 2:20 PM    Brandi Klein, MD, [facility] [phone] office [phone] pager

## 2015-01-20 NOTE — Patient Instructions (Signed)
Dr Sallyanne Kuster recommends that you schedule a follow-up appointment in 5 months. You will receive a reminder letter in the mail two months in advance. If you don't receive a letter, please call our office to schedule the follow-up appointment.

## 2015-02-28 DIAGNOSIS — E1065 Type 1 diabetes mellitus with hyperglycemia: Secondary | ICD-10-CM | POA: Diagnosis not present

## 2015-03-05 ENCOUNTER — Ambulatory Visit (INDEPENDENT_AMBULATORY_CARE_PROVIDER_SITE_OTHER): Payer: Commercial Managed Care - HMO | Admitting: Pharmacist Clinician (PhC)/ Clinical Pharmacy Specialist

## 2015-03-05 DIAGNOSIS — I480 Paroxysmal atrial fibrillation: Secondary | ICD-10-CM

## 2015-03-05 DIAGNOSIS — Z7901 Long term (current) use of anticoagulants: Secondary | ICD-10-CM | POA: Diagnosis not present

## 2015-03-05 LAB — POCT INR: INR: 2.9

## 2015-03-11 ENCOUNTER — Telehealth: Payer: Self-pay | Admitting: *Deleted

## 2015-03-11 NOTE — Telephone Encounter (Signed)
-----   Message from Rockne Menghini, [place] sent at 03/05/2015 11:32 AM EDT ----- [name] was in office for INR today.  Is complaining about swelling in her L ankle.  States has been a problem for several weeks, improves overnight but worse in day.  Is obviously swollen, although not warm to touch or tight.  Does not elevate feet during day.   Please call patient with advice

## 2015-03-11 NOTE — Telephone Encounter (Signed)
LM to see how her swelling is doing.  Any better or worse,decreasing salt in diet,keeping feet elevated during the day?  Asked that she call to let us know how she is dong.

## 2015-03-17 ENCOUNTER — Other Ambulatory Visit: Payer: Self-pay | Admitting: Gynecology

## 2015-03-17 DIAGNOSIS — Z1231 Encounter for screening mammogram for malignant neoplasm of breast: Secondary | ICD-10-CM

## 2015-04-14 ENCOUNTER — Ambulatory Visit (HOSPITAL_COMMUNITY): Payer: Commercial Managed Care - HMO

## 2015-04-16 ENCOUNTER — Ambulatory Visit (HOSPITAL_COMMUNITY)
Admission: RE | Admit: 2015-04-16 | Discharge: 2015-04-16 | Disposition: A | Discharge status: Home / Self Care | Payer: Commercial Managed Care - HMO | Source: Ambulatory Visit | Attending: Gynecology | Admitting: Gynecology

## 2015-04-16 ENCOUNTER — Other Ambulatory Visit: Payer: Self-pay | Admitting: Gynecology

## 2015-04-16 ENCOUNTER — Ambulatory Visit: Payer: Commercial Managed Care - HMO | Admitting: Pharmacist Clinician (PhC)/ Clinical Pharmacy Specialist

## 2015-04-16 DIAGNOSIS — Z1231 Encounter for screening mammogram for malignant neoplasm of breast: Secondary | ICD-10-CM | POA: Diagnosis not present

## 2015-04-24 ENCOUNTER — Other Ambulatory Visit: Payer: Self-pay | Admitting: Cardiovascular Disease

## 2015-04-24 NOTE — Telephone Encounter (Signed)
REFILL 

## 2015-04-28 ENCOUNTER — Other Ambulatory Visit: Payer: Self-pay | Admitting: Cardiovascular Disease

## 2015-04-29 NOTE — Telephone Encounter (Signed)
Rx(s) sent to pharmacy electronically.  

## 2015-04-30 ENCOUNTER — Ambulatory Visit (INDEPENDENT_AMBULATORY_CARE_PROVIDER_SITE_OTHER): Payer: Commercial Managed Care - HMO | Admitting: Pharmacist Clinician (PhC)/ Clinical Pharmacy Specialist

## 2015-04-30 DIAGNOSIS — Z7901 Long term (current) use of anticoagulants: Secondary | ICD-10-CM

## 2015-04-30 DIAGNOSIS — I480 Paroxysmal atrial fibrillation: Secondary | ICD-10-CM

## 2015-04-30 LAB — POCT INR: INR: 3.9

## 2015-05-01 ENCOUNTER — Other Ambulatory Visit: Payer: Self-pay | Admitting: Cardiovascular Disease

## 2015-05-01 DIAGNOSIS — R1310 Dysphagia, unspecified: Secondary | ICD-10-CM | POA: Diagnosis not present

## 2015-05-01 DIAGNOSIS — K219 Gastro-esophageal reflux disease without esophagitis: Secondary | ICD-10-CM | POA: Diagnosis not present

## 2015-05-02 ENCOUNTER — Other Ambulatory Visit: Payer: Self-pay | Admitting: Gastroenterology

## 2015-05-02 DIAGNOSIS — R1310 Dysphagia, unspecified: Secondary | ICD-10-CM

## 2015-05-07 ENCOUNTER — Ambulatory Visit
Admission: RE | Admit: 2015-05-07 | Discharge: 2015-05-07 | Disposition: A | Discharge status: Home / Self Care | Payer: Commercial Managed Care - HMO | Source: Ambulatory Visit | Attending: Gastroenterology | Admitting: Gastroenterology

## 2015-05-07 DIAGNOSIS — K219 Gastro-esophageal reflux disease without esophagitis: Secondary | ICD-10-CM | POA: Diagnosis not present

## 2015-05-07 DIAGNOSIS — R1310 Dysphagia, unspecified: Secondary | ICD-10-CM

## 2015-05-07 DIAGNOSIS — K449 Diaphragmatic hernia without obstruction or gangrene: Secondary | ICD-10-CM | POA: Diagnosis not present

## 2015-05-07 DIAGNOSIS — K222 Esophageal obstruction: Secondary | ICD-10-CM | POA: Diagnosis not present

## 2015-05-12 ENCOUNTER — Encounter: Payer: Self-pay | Admitting: Pharmacist Clinician (PhC)/ Clinical Pharmacy Specialist

## 2015-05-12 ENCOUNTER — Other Ambulatory Visit: Payer: Self-pay | Admitting: Cardiology

## 2015-05-12 ENCOUNTER — Other Ambulatory Visit: Payer: Self-pay | Admitting: Gastroenterology

## 2015-05-12 ENCOUNTER — Telehealth: Payer: Self-pay | Admitting: Cardiovascular Disease

## 2015-05-12 NOTE — Telephone Encounter (Signed)
Letter faxed to [place] at [phone]

## 2015-05-12 NOTE — Telephone Encounter (Signed)
Rx(s) sent to pharmacy electronically.  

## 2015-05-12 NOTE — Telephone Encounter (Signed)
[name] is calling  from Dr. Ulyses Amor office to get clearance for her coumadin , she is having endoscopy dilation on 05/23/15. Needs clearance to be off coumadin for 5 days. Please Call  Thanks

## 2015-05-13 NOTE — Telephone Encounter (Signed)
Fax failed.   Letter routed to Dr. Benson Norway via [facility]

## 2015-05-16 ENCOUNTER — Ambulatory Visit: Payer: Commercial Managed Care - HMO | Admitting: Pharmacist Clinician (PhC)/ Clinical Pharmacy Specialist

## 2015-05-19 ENCOUNTER — Ambulatory Visit: Payer: Commercial Managed Care - HMO | Admitting: Pharmacist Clinician (PhC)/ Clinical Pharmacy Specialist

## 2015-05-22 NOTE — H&P (Signed)
[name] HPI: This 64 year old black female presents to the office for evaluation of difficulty swallowing. This has been an issue when she tries to swallows pills. Her symptoms started when she was recovering in a Nursing home from an infection in her left knee after a repeat TKR. She has been crushing her pills and putting them in applesauce. Since then her symptoms worsened 3 weeks ago. She has a sensation like something is stuck in her throat. She is currently taking Pantoprazole for acid reflux. She was taking Metoclopramide for gastroparesis but has been out of it for a month. She has not used any recent NSAID's because she is on Plavix and Coumadin [after she had revision of a recalled cardiac stent done last fall]. She has 1 BM every other day with no obvious blood or mucus in the stool. She has good appetite and her weight has been stable. She denies having any abdominal pain, nausea, vomiting, acid reflux or odynophagia. She denies having a family history of colon cancer.  Past Medical History  Diagnosis Date  . Hypertension   . Hypercholesteremia   . Cancer 5/10    LUL Vats   . Lung cancer   . Peripheral vascular disease 8/12    Lt SFA PTA  . Sleep apnea     " mild form ,does not use cpap "  . Diabetes mellitus     insulin dependent  . GERD (gastroesophageal reflux disease)   . H/O hiatal hernia   . Arthritis   . Non-STEMI (non-ST elevated myocardial infarction) 04/07/2012    See cath results below  . Presence of stent in LAD coronary artery 04/07/2012    Xience Expedition DES 2.75 mm x 18 mm (dilated to 3.0 mm)  . Paroxysmal atrial fibrillation 04/07/2012    On Warfarin  . Heart palpitations     monitor 04/19/12-05/10/12- frequent PAF with RVR- coumadin started    Past Surgical History  Procedure Laterality Date  . Abdominal hysterectomy  1960  . Cholecystectomy    . Lung surgery  5/10  . Percutaneous coronary angioplasty and stenting  04/07/2012    Xience  Expedition DES 2.13m x 18 mm (dilated to 3.0 mm) place to the prox LAD   . Orif ankle fracture  07/09/2012    Procedure: OPEN REDUCTION INTERNAL FIXATION (ORIF) ANKLE FRACTURE;  Surgeon: GMeredith Pel MD;  Location: [facility];  Service: Orthopedics;  Laterality: Left;  open reduction internal fixation trimalleolar ankle fracture medial malleolous fixation  . Cardiac catheterization  11/04/2008    patent RCA, LM, and Circ, nl EF  . Cardiac catheterization  02/20/2002    patent coronaries with the only abnormality being a smooth luminla irregularity in the mid intermediate ramus branch no felt to be hemodynamically significant, nl LV  . Lower extremity angiogram  05/17/11    directional atherectomy to the prox L SFA using a LX Man TurboHawk, ballooned with a F[employer] Technologiesballoon   . Coronary angioplasty with stent placement  04/07/12    LAD  . Total knee revision Left 11/05/2013    Procedure: LEFT TOTAL KNEE RESECTION;  Surgeon: MMauri Pole MD;  Location: [facility];  Service: Orthopedics;  Laterality: Left;  . Joint replacement      left knee 2003  . Foot surgery Bilateral 1993    with screws, on screw removed  . Knee arthroscopy Bilateral     left x2, right x1  . Total knee revision Left  2007    opened in 2006 and cleaned out, 2007 revision  . Total knee revision Left 12/31/2013    Procedure: RE-INPLANTATION LEFT TOTAL KNEE ;  Surgeon: Mauri Pole, MD;  Location: [facility];  Service: Orthopedics;  Laterality: Left;  . Cardiac catheterization  05/27/14    DES to proximal LAD  . Left heart catheterization with coronary angiogram N/A 04/07/2012    Procedure: LEFT HEART CATHETERIZATION WITH CORONARY ANGIOGRAM;  Surgeon: [name], MD;  Location: [facility] CATH LAB;  Service: Cardiovascular;  Laterality: N/A;  . Percutaneous coronary stent intervention (pci-s) N/A 04/07/2012    Procedure: PERCUTANEOUS CORONARY STENT INTERVENTION (PCI-S);  Surgeon: [name], MD;  Location: [facility] CATH LAB;  Service:  Cardiovascular;  Laterality: N/A;  . Left heart catheterization with coronary angiogram N/A 05/27/2014    Procedure: LEFT HEART CATHETERIZATION WITH CORONARY ANGIOGRAM;  Surgeon: [name], MD;  Location: [facility] CATH LAB;  Service: Cardiovascular;  Laterality: N/A;  . Percutaneous coronary stent intervention (pci-s)  05/27/2014    Procedure: PERCUTANEOUS CORONARY STENT INTERVENTION (PCI-S);  Surgeon: [name], MD;  Location: [facility] CATH LAB;  Service: Cardiovascular;;  prox lad    Family History  Problem Relation Age of Onset  . Hypertension Mother   . Coronary artery disease Brother   . Hypertension Sister     Social History:  reports that she quit smoking about 30 years ago. Her smoking use included Cigarettes. She has a 20 pack-year smoking history. She has never used smokeless tobacco. She reports that she does not drink alcohol or use illicit drugs.  Allergies:  Allergies  Allergen Reactions  . Levofloxacin Itching  . Morphine And Related Itching    Medications: Scheduled: Continuous:  No results found for this or any previous visit (from the past 24 hour(s)).   No results found.  ROS:  As stated above in the HPI otherwise negative.  There were no vitals taken for this visit.    PE: Gen: NAD, Alert and Oriented HEENT:  [place]/AT, EOMI Neck: Supple, no LAD Lungs: CTA Bilaterally CV: RRR without M/G/R ABM: Soft, NTND, +BS Ext: No C/C/E  Assessment/Plan: 1) Dysphagia - EGD with dilation.  Caitlin Ainley D 05/22/2015, 7:39 AM

## 2015-05-23 ENCOUNTER — Ambulatory Visit (HOSPITAL_COMMUNITY)
Admission: RE | Admit: 2015-05-23 | Discharge: 2015-05-23 | Disposition: A | Discharge status: Home / Self Care | Payer: Commercial Managed Care - HMO | Source: Ambulatory Visit | Attending: Gastroenterology | Admitting: Gastroenterology

## 2015-05-23 ENCOUNTER — Encounter (HOSPITAL_COMMUNITY)
Admission: RE | Disposition: A | Discharge status: Home / Self Care | Payer: Self-pay | Source: Ambulatory Visit | Attending: Gastroenterology

## 2015-05-23 ENCOUNTER — Encounter (HOSPITAL_COMMUNITY): Payer: Self-pay | Admitting: *Deleted

## 2015-05-23 DIAGNOSIS — Z7901 Long term (current) use of anticoagulants: Secondary | ICD-10-CM | POA: Diagnosis not present

## 2015-05-23 DIAGNOSIS — I10 Essential (primary) hypertension: Secondary | ICD-10-CM | POA: Diagnosis not present

## 2015-05-23 DIAGNOSIS — Q394 Esophageal web: Secondary | ICD-10-CM | POA: Diagnosis not present

## 2015-05-23 DIAGNOSIS — Z955 Presence of coronary angioplasty implant and graft: Secondary | ICD-10-CM | POA: Insufficient documentation

## 2015-05-23 DIAGNOSIS — K3184 Gastroparesis: Secondary | ICD-10-CM | POA: Insufficient documentation

## 2015-05-23 DIAGNOSIS — K222 Esophageal obstruction: Secondary | ICD-10-CM | POA: Insufficient documentation

## 2015-05-23 DIAGNOSIS — K219 Gastro-esophageal reflux disease without esophagitis: Secondary | ICD-10-CM | POA: Insufficient documentation

## 2015-05-23 DIAGNOSIS — Z96652 Presence of left artificial knee joint: Secondary | ICD-10-CM | POA: Diagnosis not present

## 2015-05-23 DIAGNOSIS — Z7902 Long term (current) use of antithrombotics/antiplatelets: Secondary | ICD-10-CM | POA: Insufficient documentation

## 2015-05-23 DIAGNOSIS — E780 Pure hypercholesterolemia: Secondary | ICD-10-CM | POA: Insufficient documentation

## 2015-05-23 DIAGNOSIS — Z79899 Other long term (current) drug therapy: Secondary | ICD-10-CM | POA: Diagnosis not present

## 2015-05-23 DIAGNOSIS — I480 Paroxysmal atrial fibrillation: Secondary | ICD-10-CM | POA: Diagnosis not present

## 2015-05-23 DIAGNOSIS — Z87891 Personal history of nicotine dependence: Secondary | ICD-10-CM | POA: Diagnosis not present

## 2015-05-23 DIAGNOSIS — I739 Peripheral vascular disease, unspecified: Secondary | ICD-10-CM | POA: Insufficient documentation

## 2015-05-23 DIAGNOSIS — I252 Old myocardial infarction: Secondary | ICD-10-CM | POA: Insufficient documentation

## 2015-05-23 DIAGNOSIS — E1143 Type 2 diabetes mellitus with diabetic autonomic (poly)neuropathy: Secondary | ICD-10-CM | POA: Diagnosis not present

## 2015-05-23 DIAGNOSIS — Z794 Long term (current) use of insulin: Secondary | ICD-10-CM | POA: Diagnosis not present

## 2015-05-23 DIAGNOSIS — G4730 Sleep apnea, unspecified: Secondary | ICD-10-CM | POA: Diagnosis not present

## 2015-05-23 DIAGNOSIS — R1310 Dysphagia, unspecified: Secondary | ICD-10-CM | POA: Diagnosis not present

## 2015-05-23 DIAGNOSIS — M1990 Unspecified osteoarthritis, unspecified site: Secondary | ICD-10-CM | POA: Diagnosis not present

## 2015-05-23 HISTORY — PX: ESOPHAGOGASTRODUODENOSCOPY: SHX5428

## 2015-05-23 LAB — GLUCOSE, CAPILLARY: Glucose-Capillary: 106 mg/dL — ABNORMAL HIGH (ref 65–99)

## 2015-05-23 SURGERY — EGD (ESOPHAGOGASTRODUODENOSCOPY)
Anesthesia: Moderate Sedation

## 2015-05-23 MED ORDER — BUTAMBEN-TETRACAINE-BENZOCAINE 2-2-14 % EX AERO
INHALATION_SPRAY | CUTANEOUS | Status: DC | PRN
  Administered 2015-05-23: 2 via TOPICAL

## 2015-05-23 MED ORDER — DIPHENHYDRAMINE HCL 50 MG/ML IJ SOLN
INTRAMUSCULAR | Status: AC
  Filled 2015-05-23: qty 1

## 2015-05-23 MED ORDER — FENTANYL CITRATE (PF) 100 MCG/2ML IJ SOLN
INTRAMUSCULAR | Status: DC | PRN
  Administered 2015-05-23 (×2): 25 ug via INTRAVENOUS

## 2015-05-23 MED ORDER — MIDAZOLAM HCL 10 MG/2ML IJ SOLN
INTRAMUSCULAR | Status: DC | PRN
  Administered 2015-05-23: 1 mg via INTRAVENOUS
  Administered 2015-05-23 (×2): 2 mg via INTRAVENOUS

## 2015-05-23 MED ORDER — MIDAZOLAM HCL 5 MG/ML IJ SOLN
INTRAMUSCULAR | Status: AC
  Filled 2015-05-23: qty 2

## 2015-05-23 MED ORDER — FENTANYL CITRATE (PF) 100 MCG/2ML IJ SOLN
INTRAMUSCULAR | Status: AC
  Filled 2015-05-23: qty 2

## 2015-05-23 MED ORDER — SODIUM CHLORIDE 0.9 % IV SOLN
INTRAVENOUS | Status: DC
Start: 2015-05-23 — End: 2015-05-23
  Administered 2015-05-23: 500 mL via INTRAVENOUS

## 2015-05-23 NOTE — Op Note (Signed)
[facility] [place], [address]   ENDOSCOPY PROCEDURE REPORT  PATIENT: Brandi, [name]  MR#: [id number] BIRTHDATE: 09/15/1951 , 41  yrs. old GENDER: female ENDOSCOPIST:Ladarian Bonczek Benson Norway, MD REFERRED BY: PROCEDURE DATE:  Jun 12, 2015 PROCEDURE:   EGD, diagnostic ASA CLASS:    Class III INDICATIONS: dysphagia. MEDICATION: Versed 5 mg IV and Fentanyl 50 mcg IV TOPICAL ANESTHETIC:   none  DESCRIPTION OF PROCEDURE:   After the risks and benefits of the procedure were explained, informed consent was obtained.  The Pentax Gastroscope [id number]  endoscope was introduced through the mouth  and advanced to the second portion of the duodenum .  The instrument was slowly withdrawn as the mucosa was fully examined. Estimated blood loss is zero unless otherwise noted in this procedure report.   FINDINGS: Upon initial entry a cervical web was identified. Mild/moderate resistance was encountered, but the endoscope was able to pass through the stricture.  In the distal esophagus there were two areas of discrete stenosis.  One was at the GE junction and the other was 2-3 cm proximal.  No abnormalities noted in the gastric or duodenal lumens.  Dilation was not performed as the patient did not hold her Plavix, however, the cervical web was dilated with passage of the endoscope.  Close examination of the area at the end of the procedure did not reveal any signifcant bleeding.    Retroflexed views revealed no abnormalities.    The scope was then withdrawn from the patient and the procedure completed.  COMPLICATIONS: There were no immediate complications.  ENDOSCOPIC IMPRESSION: 1) Cervical esophageal web s/p dilation with passage of the endoscope. 2) Two nonobstructing distal esophageal strictures. RECOMMENDATIONS: 1) Repeat EGD with dilation off of Plavix and coumadin. _______________________________ eSignedCarol Ada, MD Jun 12, 2015 8:41 AM   cc: CPT  CODES: ICD CODES:  The ICD and CPT codes recommended by this software are interpretations from the data that the clinical staff has captured with the software.  The verification of the translation of this report to the ICD and CPT codes and modifiers is the sole responsibility of the health care institution and practicing physician where this report was generated.  [employer]. will not be held responsible for the validity of the ICD and CPT codes included on this report.  AMA assumes no liability for data contained or not contained herein. CPT is a Designer, television/film set of the [employer].

## 2015-05-23 NOTE — Discharge Instructions (Signed)
Esophagogastroduodenoscopy °Care After °Refer to this sheet in the next few weeks. These instructions provide you with information on caring for yourself after your procedure. Your caregiver may also give you more specific instructions. Your treatment has been planned according to current medical practices, but problems sometimes occur. Call your caregiver if you have any problems or questions after your procedure.  °HOME CARE INSTRUCTIONS °· Do not eat or drink anything until the numbing medicine (local anesthetic) has worn off and your gag reflex has returned. You will know that the local anesthetic has worn off when you can swallow comfortably. °· Do not drive for 12 hours after the procedure or as directed by your caregiver. °· Only take medicines as directed by your caregiver. °SEEK MEDICAL CARE IF:  °· You cannot stop coughing. °· You are not urinating at all or less than usual. °SEEK IMMEDIATE MEDICAL CARE IF: °· You have difficulty swallowing. °· You cannot eat or drink. °· You have worsening throat or chest pain. °· You have dizziness, lightheadedness, or you faint. °· You have nausea or vomiting. °· You have chills. °· You have a fever. °· You have severe abdominal pain. °· You have black, tarry, or bloody stools. °Document Released: 08/30/2012 Document Reviewed: 08/30/2012 °Exit[employer]® Patient Information ©2015 [employer]. This information is not intended to replace advice given to you by your health care provider. Make sure you discuss any questions you have with your health care provider. ° °

## 2015-05-27 ENCOUNTER — Encounter (HOSPITAL_COMMUNITY): Payer: Self-pay | Admitting: Gastroenterology

## 2015-05-30 ENCOUNTER — Ambulatory Visit: Payer: Commercial Managed Care - HMO | Admitting: Pharmacist Clinician (PhC)/ Clinical Pharmacy Specialist

## 2015-06-03 DIAGNOSIS — E1065 Type 1 diabetes mellitus with hyperglycemia: Secondary | ICD-10-CM | POA: Diagnosis not present

## 2015-06-04 DIAGNOSIS — E782 Mixed hyperlipidemia: Secondary | ICD-10-CM | POA: Diagnosis not present

## 2015-06-04 DIAGNOSIS — E1065 Type 1 diabetes mellitus with hyperglycemia: Secondary | ICD-10-CM | POA: Diagnosis not present

## 2015-06-04 DIAGNOSIS — I10 Essential (primary) hypertension: Secondary | ICD-10-CM | POA: Diagnosis not present

## 2015-06-06 ENCOUNTER — Other Ambulatory Visit: Payer: Self-pay | Admitting: Gastroenterology

## 2015-06-10 ENCOUNTER — Encounter (HOSPITAL_COMMUNITY): Payer: Self-pay | Admitting: *Deleted

## 2015-06-12 NOTE — H&P (View-Only) (Signed)
[name] HPI: This 64 year old black female presents to the office for evaluation of difficulty swallowing. This has been an issue when she tries to swallows pills. Her symptoms started when she was recovering in a Nursing home from an infection in her left knee after a repeat TKR. She has been crushing her pills and putting them in applesauce. Since then her symptoms worsened 3 weeks ago. She has a sensation [name] something is stuck in her throat. She is currently taking Pantoprazole for acid reflux. She was taking Metoclopramide for gastroparesis but has been out of it for a month. She has not used any recent NSAID's because she is on Plavix and Coumadin [after she had revision of a recalled cardiac stent done last fall]. She has 1 BM every other day with no obvious blood or mucus in the stool. She has good appetite and her weight has been stable. She denies having any abdominal pain, nausea, vomiting, acid reflux or odynophagia. She denies having a family history of colon cancer.  Past Medical History  Diagnosis Date  . Hypertension   . Hypercholesteremia   . Cancer 5/10    LUL Vats   . Lung cancer   . Peripheral vascular disease 8/12    Lt SFA PTA  . Sleep apnea     " mild form ,does not use cpap "  . Diabetes mellitus     insulin dependent  . GERD (gastroesophageal reflux disease)   . H/O hiatal hernia   . Arthritis   . Non-STEMI (non-ST elevated myocardial infarction) 04/07/2012    See cath results below  . Presence of stent in LAD coronary artery 04/07/2012    Xience Expedition DES 2.75 mm x 18 mm (dilated to 3.0 mm)  . Paroxysmal atrial fibrillation 04/07/2012    On Warfarin  . Heart palpitations     monitor 04/19/12-05/10/12- frequent PAF with RVR- coumadin started    Past Surgical History  Procedure Laterality Date  . Abdominal hysterectomy  1960  . Cholecystectomy    . Lung surgery  5/10  . Percutaneous coronary angioplasty and stenting  04/07/2012    Xience  Expedition DES 2.38m x 18 mm (dilated to 3.0 mm) place to the prox LAD   . Orif ankle fracture  07/09/2012    Procedure: OPEN REDUCTION INTERNAL FIXATION (ORIF) ANKLE FRACTURE;  Surgeon: GMeredith Pel MD;  Location: [facility];  Service: Orthopedics;  Laterality: Left;  open reduction internal fixation trimalleolar ankle fracture medial malleolous fixation  . Cardiac catheterization  11/04/2008    patent RCA, LM, and Circ, nl EF  . Cardiac catheterization  02/20/2002    patent coronaries with the only abnormality being a smooth luminla irregularity in the mid intermediate ramus branch no felt to be hemodynamically significant, nl LV  . Lower extremity angiogram  05/17/11    directional atherectomy to the prox L SFA using a LX Man TurboHawk, ballooned with a F[employer]ilent Technologiesballoon   . Coronary angioplasty with stent placement  04/07/12    LAD  . Total knee revision Left 11/05/2013    Procedure: LEFT TOTAL KNEE RESECTION;  Surgeon: MMauri Pole MD;  Location: [facility];  Service: Orthopedics;  Laterality: Left;  . Joint replacement      left knee 2003  . Foot surgery Bilateral 1993    with screws, on screw removed  . Knee arthroscopy Bilateral     left x2, right x1  . Total knee revision Left  2007    opened in 2006 and cleaned out, 2007 revision  . Total knee revision Left 12/31/2013    Procedure: RE-INPLANTATION LEFT TOTAL KNEE ;  Surgeon: Mauri Pole, MD;  Location: [facility];  Service: Orthopedics;  Laterality: Left;  . Cardiac catheterization  05/27/14    DES to proximal LAD  . Left heart catheterization with coronary angiogram N/A 04/07/2012    Procedure: LEFT HEART CATHETERIZATION WITH CORONARY ANGIOGRAM;  Surgeon: [name], MD;  Location: [facility] CATH LAB;  Service: Cardiovascular;  Laterality: N/A;  . Percutaneous coronary stent intervention (pci-s) N/A 04/07/2012    Procedure: PERCUTANEOUS CORONARY STENT INTERVENTION (PCI-S);  Surgeon: [name], MD;  Location: [facility] CATH LAB;  Service:  Cardiovascular;  Laterality: N/A;  . Left heart catheterization with coronary angiogram N/A 05/27/2014    Procedure: LEFT HEART CATHETERIZATION WITH CORONARY ANGIOGRAM;  Surgeon: [name], MD;  Location: [facility] CATH LAB;  Service: Cardiovascular;  Laterality: N/A;  . Percutaneous coronary stent intervention (pci-s)  05/27/2014    Procedure: PERCUTANEOUS CORONARY STENT INTERVENTION (PCI-S);  Surgeon: [name], MD;  Location: [facility] CATH LAB;  Service: Cardiovascular;;  prox lad    Family History  Problem Relation Age of Onset  . Hypertension Mother   . Coronary artery disease Brother   . Hypertension Sister     Social History:  reports that she quit smoking about 30 years ago. Her smoking use included Cigarettes. She has a 20 pack-year smoking history. She has never used smokeless tobacco. She reports that she does not drink alcohol or use illicit drugs.  Allergies:  Allergies  Allergen Reactions  . Levofloxacin Itching  . Morphine And Related Itching    Medications: Scheduled: Continuous:  No results found for this or any previous visit (from the past 24 hour(s)).   No results found.  ROS:  As stated above in the HPI otherwise negative.  There were no vitals taken for this visit.    PE: Gen: NAD, Alert and Oriented HEENT:  [place]/AT, EOMI Neck: Supple, no LAD Lungs: CTA Bilaterally CV: RRR without M/G/R ABM: Soft, NTND, +BS Ext: No C/C/E  Assessment/Plan: 1) Dysphagia - EGD with dilation.  [name] D 05/22/2015, 7:39 AM

## 2015-06-12 NOTE — Interval H&P Note (Signed)
History and Physical Interval Note:  06/12/2015 11:04 AM  [name]  has presented today for surgery, with the diagnosis of dysphagia  The various methods of treatment have been discussed with the patient and family. After consideration of risks, benefits and other options for treatment, the patient has consented to  Procedure(s): ESOPHAGOGASTRODUODENOSCOPY (EGD) WITH PROPOFOL (N/A) as a surgical intervention .  The patient's history has been reviewed, patient examined, no change in status, stable for surgery.  I have reviewed the patient's chart and labs.  Questions were answered to the patient's satisfaction.     [name]

## 2015-06-13 ENCOUNTER — Encounter (HOSPITAL_COMMUNITY): Payer: Self-pay | Admitting: *Deleted

## 2015-06-13 ENCOUNTER — Ambulatory Visit (HOSPITAL_COMMUNITY): Payer: Commercial Managed Care - HMO | Admitting: Anesthesiology

## 2015-06-13 ENCOUNTER — Encounter (HOSPITAL_COMMUNITY)
Admission: RE | Disposition: A | Discharge status: Home / Self Care | Payer: Self-pay | Source: Ambulatory Visit | Attending: Gastroenterology

## 2015-06-13 ENCOUNTER — Ambulatory Visit (HOSPITAL_COMMUNITY)
Admission: RE | Admit: 2015-06-13 | Discharge: 2015-06-13 | Disposition: A | Discharge status: Home / Self Care | Payer: Commercial Managed Care - HMO | Source: Ambulatory Visit | Attending: Gastroenterology | Admitting: Gastroenterology

## 2015-06-13 DIAGNOSIS — Z794 Long term (current) use of insulin: Secondary | ICD-10-CM | POA: Diagnosis not present

## 2015-06-13 DIAGNOSIS — Z7901 Long term (current) use of anticoagulants: Secondary | ICD-10-CM | POA: Diagnosis not present

## 2015-06-13 DIAGNOSIS — Z87891 Personal history of nicotine dependence: Secondary | ICD-10-CM | POA: Diagnosis not present

## 2015-06-13 DIAGNOSIS — I2510 Atherosclerotic heart disease of native coronary artery without angina pectoris: Secondary | ICD-10-CM | POA: Insufficient documentation

## 2015-06-13 DIAGNOSIS — R1310 Dysphagia, unspecified: Secondary | ICD-10-CM | POA: Diagnosis not present

## 2015-06-13 DIAGNOSIS — E1151 Type 2 diabetes mellitus with diabetic peripheral angiopathy without gangrene: Secondary | ICD-10-CM | POA: Diagnosis not present

## 2015-06-13 DIAGNOSIS — K209 Esophagitis, unspecified: Secondary | ICD-10-CM | POA: Diagnosis not present

## 2015-06-13 DIAGNOSIS — K449 Diaphragmatic hernia without obstruction or gangrene: Secondary | ICD-10-CM | POA: Insufficient documentation

## 2015-06-13 DIAGNOSIS — K222 Esophageal obstruction: Secondary | ICD-10-CM | POA: Insufficient documentation

## 2015-06-13 DIAGNOSIS — Z955 Presence of coronary angioplasty implant and graft: Secondary | ICD-10-CM | POA: Diagnosis not present

## 2015-06-13 DIAGNOSIS — E780 Pure hypercholesterolemia: Secondary | ICD-10-CM | POA: Diagnosis not present

## 2015-06-13 DIAGNOSIS — I10 Essential (primary) hypertension: Secondary | ICD-10-CM | POA: Insufficient documentation

## 2015-06-13 DIAGNOSIS — M1990 Unspecified osteoarthritis, unspecified site: Secondary | ICD-10-CM | POA: Insufficient documentation

## 2015-06-13 DIAGNOSIS — Q394 Esophageal web: Secondary | ICD-10-CM | POA: Diagnosis not present

## 2015-06-13 DIAGNOSIS — Z7902 Long term (current) use of antithrombotics/antiplatelets: Secondary | ICD-10-CM | POA: Insufficient documentation

## 2015-06-13 DIAGNOSIS — Z96652 Presence of left artificial knee joint: Secondary | ICD-10-CM | POA: Diagnosis not present

## 2015-06-13 DIAGNOSIS — I252 Old myocardial infarction: Secondary | ICD-10-CM | POA: Insufficient documentation

## 2015-06-13 DIAGNOSIS — G4730 Sleep apnea, unspecified: Secondary | ICD-10-CM | POA: Diagnosis not present

## 2015-06-13 DIAGNOSIS — I480 Paroxysmal atrial fibrillation: Secondary | ICD-10-CM | POA: Diagnosis not present

## 2015-06-13 DIAGNOSIS — K219 Gastro-esophageal reflux disease without esophagitis: Secondary | ICD-10-CM | POA: Insufficient documentation

## 2015-06-13 DIAGNOSIS — K3184 Gastroparesis: Secondary | ICD-10-CM | POA: Insufficient documentation

## 2015-06-13 HISTORY — PX: ESOPHAGOGASTRODUODENOSCOPY (EGD) WITH PROPOFOL: SHX5813

## 2015-06-13 HISTORY — PX: SAVORY DILATION: SHX5439

## 2015-06-13 SURGERY — ESOPHAGOGASTRODUODENOSCOPY (EGD) WITH PROPOFOL
Anesthesia: Monitor Anesthesia Care

## 2015-06-13 MED ORDER — PROPOFOL 10 MG/ML IV BOLUS
INTRAVENOUS | Status: DC | PRN
  Administered 2015-06-13: 20 mg via INTRAVENOUS
  Administered 2015-06-13: 30 mg via INTRAVENOUS

## 2015-06-13 MED ORDER — PROPOFOL 10 MG/ML IV BOLUS
INTRAVENOUS | Status: AC
  Filled 2015-06-13: qty 20

## 2015-06-13 MED ORDER — PROPOFOL 10 MG/ML IV BOLUS
INTRAVENOUS | Status: AC
Start: 2015-06-13 — End: 2015-06-13
  Filled 2015-06-13: qty 20

## 2015-06-13 MED ORDER — LACTATED RINGERS IV SOLN
INTRAVENOUS | Status: DC
  Administered 2015-06-13: 1000 mL via INTRAVENOUS

## 2015-06-13 MED ORDER — SODIUM CHLORIDE 0.9 % IV SOLN: INTRAVENOUS | Status: DC

## 2015-06-13 MED ORDER — PROPOFOL INFUSION 10 MG/ML OPTIME
INTRAVENOUS | Status: DC | PRN
  Administered 2015-06-13: 150 ug/kg/min via INTRAVENOUS

## 2015-06-13 SURGICAL SUPPLY — 14 items

## 2015-06-13 NOTE — Transfer of Care (Signed)
Immediate Anesthesia Transfer of Care Note  Patient: [name]  Procedure(s) Performed: Procedure(s): ESOPHAGOGASTRODUODENOSCOPY (EGD) WITH PROPOFOL (N/A) SAVORY DILATION (N/A)  Patient Location: PACU and Endoscopy Unit  Anesthesia Type:MAC  Level of Consciousness: awake, alert  and patient cooperative  Airway & Oxygen Therapy: Patient Spontanous Breathing and Patient connected to nasal cannula oxygen  Post-op Assessment: Report given to RN and Post -op Vital signs reviewed and stable  Post vital signs: Reviewed and stable  Last Vitals:  Filed Vitals:   06/13/15 1115  BP: 219/81  Temp: 36.4 C  Resp: 17    Complications: No apparent anesthesia complications

## 2015-06-13 NOTE — Anesthesia Preprocedure Evaluation (Addendum)
Anesthesia Evaluation  Patient identified by MRN, date of birth, ID band Patient awake    Reviewed: Allergy & Precautions, NPO status , Patient's Chart, lab work & pertinent test results  History of Anesthesia Complications Negative for: history of anesthetic complications  Airway Mallampati: III  TM Distance: >3 FB Neck ROM: Full    Dental no notable dental hx. (+) Dental Advisory Given   Pulmonary neg pulmonary ROS, sleep apnea , former smoker,    Pulmonary exam normal breath sounds clear to auscultation       Cardiovascular hypertension, Pt. on medications + CAD, + Past MI and + Peripheral Vascular Disease  Normal cardiovascular exam Rhythm:Regular Rate:Normal  Clearance from cardiology   Neuro/Psych negative neurological ROS  negative psych ROS   GI/Hepatic Neg liver ROS, GERD  Medicated and Controlled,  Endo/Other  diabetesMorbid obesity  Renal/GU negative Renal ROS  negative genitourinary   Musculoskeletal  (+) Arthritis ,   Abdominal (+) + obese,   Peds negative pediatric ROS (+)  Hematology negative hematology ROS (+) anemia ,   Anesthesia Other Findings   Reproductive/Obstetrics negative OB ROS                            Anesthesia Physical Anesthesia Plan  ASA: III  Anesthesia Plan: MAC   Post-op Pain Management:    Induction: Intravenous  Airway Management Planned: Nasal Cannula  Additional Equipment:   Intra-op Plan:   Post-operative Plan:   Informed Consent:   Plan Discussed with: CRNA  Anesthesia Plan Comments:         Anesthesia Quick Evaluation

## 2015-06-13 NOTE — Anesthesia Postprocedure Evaluation (Signed)
  Anesthesia Post-op Note  Patient: [name]  Procedure(s) Performed: Procedure(s) (LRB): ESOPHAGOGASTRODUODENOSCOPY (EGD) WITH PROPOFOL (N/A) SAVORY DILATION (N/A)  Patient Location: PACU  Anesthesia Type: MAC  Level of Consciousness: awake and alert   Airway and Oxygen Therapy: Patient Spontanous Breathing  Post-op Pain: mild  Post-op Assessment: Post-op Vital signs reviewed, Patient's Cardiovascular Status Stable, Respiratory Function Stable, Patent Airway and No signs of Nausea or vomiting  Last Vitals:  Filed Vitals:   06/13/15 1240  BP:   Pulse: 68  Temp:   Resp: 18    Post-op Vital Signs: stable   Complications: No apparent anesthesia complications

## 2015-06-13 NOTE — Interval H&P Note (Signed)
History and Physical Interval Note:  06/13/2015 11:41 AM  [name]  has presented today for surgery, with the diagnosis of dysphagia  The various methods of treatment have been discussed with the patient and family. After consideration of risks, benefits and other options for treatment, the patient has consented to  Procedure(s): ESOPHAGOGASTRODUODENOSCOPY (EGD) WITH PROPOFOL (N/A) as a surgical intervention .  The patient's history has been reviewed, patient examined, no change in status, stable for surgery.  I have reviewed the patient's chart and labs.  Questions were answered to the patient's satisfaction.     Mare Ludtke D

## 2015-06-13 NOTE — Discharge Instructions (Signed)
Esophagogastroduodenoscopy °Care After °Refer to this sheet in the next few weeks. These instructions provide you with information on caring for yourself after your procedure. Your caregiver may also give you more specific instructions. Your treatment has been planned according to current medical practices, but problems sometimes occur. Call your caregiver if you have any problems or questions after your procedure.  °HOME CARE INSTRUCTIONS °· Do not eat or drink anything until the numbing medicine (local anesthetic) has worn off and your gag reflex has returned. You will know that the local anesthetic has worn off when you can swallow comfortably. °· Do not drive for 12 hours after the procedure or as directed by your caregiver. °· Only take medicines as directed by your caregiver. °SEEK MEDICAL CARE IF:  °· You cannot stop coughing. °· You are not urinating at all or less than usual. °SEEK IMMEDIATE MEDICAL CARE IF: °· You have difficulty swallowing. °· You cannot eat or drink. °· You have worsening throat or chest pain. °· You have dizziness, lightheadedness, or you faint. °· You have nausea or vomiting. °· You have chills. °· You have a fever. °· You have severe abdominal pain. °· You have black, tarry, or bloody stools. °Document Released: 08/30/2012 Document Reviewed: 08/30/2012 °Exit[employer]® Patient Information ©2015 [employer]. This information is not intended to replace advice given to you by your health care provider. Make sure you discuss any questions you have with your health care provider. ° °

## 2015-06-13 NOTE — Op Note (Signed)
[facility] [place], [id number]   ENDOSCOPY PROCEDURE REPORT  PATIENT: [name], [name]  MR#: [id number] BIRTHDATE: [birth year] , 23  yrs. old GENDER: female ENDOSCOPIST:Patrick Benson Norway, MD REFERRED BY: PROCEDURE DATE:  07-05-2015 PROCEDURE:   EGD w/ Savary dilation and EGD w/ biopsy ASA CLASS:    Class III INDICATIONS:  Esophageal stricture MEDICATION: Monitored anesthesia care TOPICAL ANESTHETIC:   none  DESCRIPTION OF PROCEDURE:   After the risks and benefits of the procedure were explained, informed consent was obtained.  The endoscope was introduced through the mouth  and advanced to the second portion of the duodenum .  The instrument was slowly withdrawn as the mucosa was fully examined. Estimated blood loss is zero unless otherwise noted in this procedure report.   FINDINGS: The cervical esophageal web was again encountered.  Mild resistance was noted with passage of the endoscope.  In the distal esophagus there was evidence of a benign stricture.  Concentric rings were also found during this procedure.  A 3 cm hiatal hernia was also identified.  The gastric lumen and duodenal lumen were normal.  Over a guidwire a one time 15 mm Savary dilation was performed.  A relook endoscopy revealed the expected mucosal tears in the distal esophagus and the cervical esophagus.  Cold biopsies of the mid to upper esophagus were obtained to evaluate for EoE. Retroflexed views revealed no abnormalities.    The scope was then withdrawn from the patient and the procedure completed.  COMPLICATIONS: There were no immediate complications.  ENDOSCOPIC IMPRESSION: 1) Cervical web. 2) distal esophageal stricture. 3) ? EoE. 4) Hiatal hernia.  RECOMMENDATIONS: 1) Follow up in the office in one month. 2) Resume ASA and Plavix tomorrow.   _______________________________ eSignedCarol Ada, MD 07/05/2015 12:12 PM     cc:  CPT CODES: ICD  CODES:  The ICD and CPT codes recommended by this software are interpretations from the data that the clinical staff has captured with the software.  The verification of the translation of this report to the ICD and CPT codes and modifiers is the sole responsibility of the health care institution and practicing physician where this report was generated.  [employer] will not be held responsible for the validity of the ICD and CPT codes included on this report.  AMA assumes no liability for data contained or not contained herein. CPT is a Designer, television/film set of the [employer].  PATIENT NAME:  [name], [name] MR#: [id number]

## 2015-06-16 ENCOUNTER — Encounter (HOSPITAL_COMMUNITY): Payer: Self-pay | Admitting: Gastroenterology

## 2015-06-16 DIAGNOSIS — R1310 Dysphagia, unspecified: Secondary | ICD-10-CM | POA: Diagnosis not present

## 2015-06-16 DIAGNOSIS — K222 Esophageal obstruction: Secondary | ICD-10-CM | POA: Diagnosis not present

## 2015-06-16 LAB — GLUCOSE, CAPILLARY: GLUCOSE-CAPILLARY: 219 mg/dL — AB (ref 65–99)

## 2015-06-25 ENCOUNTER — Other Ambulatory Visit: Payer: Self-pay | Admitting: Cardiovascular Disease

## 2015-06-25 NOTE — Telephone Encounter (Signed)
Rx(s) sent to pharmacy electronically.  

## 2015-06-26 ENCOUNTER — Ambulatory Visit (INDEPENDENT_AMBULATORY_CARE_PROVIDER_SITE_OTHER): Payer: Commercial Managed Care - HMO | Admitting: Pharmacist Clinician (PhC)/ Clinical Pharmacy Specialist

## 2015-06-26 ENCOUNTER — Encounter: Payer: Self-pay | Admitting: Cardiovascular Disease

## 2015-06-26 ENCOUNTER — Ambulatory Visit (INDEPENDENT_AMBULATORY_CARE_PROVIDER_SITE_OTHER): Payer: Commercial Managed Care - HMO | Admitting: Cardiovascular Disease

## 2015-06-26 ENCOUNTER — Other Ambulatory Visit: Payer: Self-pay | Admitting: Cardiovascular Disease

## 2015-06-26 VITALS — BP 116/60 | HR 68 | Resp 16 | Ht 63.0 in | Wt 271.0 lb

## 2015-06-26 DIAGNOSIS — I5032 Chronic diastolic (congestive) heart failure: Secondary | ICD-10-CM

## 2015-06-26 DIAGNOSIS — I2510 Atherosclerotic heart disease of native coronary artery without angina pectoris: Secondary | ICD-10-CM

## 2015-06-26 DIAGNOSIS — I480 Paroxysmal atrial fibrillation: Secondary | ICD-10-CM

## 2015-06-26 DIAGNOSIS — Z7901 Long term (current) use of anticoagulants: Secondary | ICD-10-CM | POA: Diagnosis not present

## 2015-06-26 DIAGNOSIS — I739 Peripheral vascular disease, unspecified: Secondary | ICD-10-CM | POA: Diagnosis not present

## 2015-06-26 DIAGNOSIS — I255 Ischemic cardiomyopathy: Secondary | ICD-10-CM | POA: Diagnosis not present

## 2015-06-26 LAB — POCT INR: INR: 2.3

## 2015-06-26 NOTE — Patient Instructions (Signed)
Your physician has recommended you make the following change in your medication: STOP CLOPIDOGREL (PLAVIX).  Dr. Sallyanne Kuster recommends that you schedule a follow-up appointment in: 6 MONTHS

## 2015-06-26 NOTE — Progress Notes (Signed)
Patient ID: [name], female   DOB: 10-03-1950, 64 y.o.   MRN: [id number]     Cardiology Office Note   Date:  06/28/2015   ID:  [name], DOB Feb 06, 1951, MRN [id number]  PCP:  [name], MD  Cardiologist:   Sanda Klein, MD   Chief Complaint  Patient presents with  . 5 MONTHS  . Edema    ANKLES      History of Present Illness: [name] is a 64 y.o. female who presents for CAD and PAD, ischemic cardiomyopathy and paroxysmal atrial fibrillation  In 2013 she received a drug-eluting stent to the LAD artery. In [id number] she had high-grade in-stent restenosis treated with balloon angioplasty and placement of a new 312 mm drug-eluting Xience expedition stent to the distal edge of the previous stent. Incidental note made of a 50-60 percent mid AV groove left circumflex stenosis She has been angina free since. She has normal left ventricular systolic function and no congestive heart failure.  In addition she has history of PAD status post atherectomy of the left superficial femoral artery and paroxysmal atrial fibrillation (on chronic warfarin anticoagulation). Additional comorbid conditions include hypertension, obesity, type 2 diabetes mellitus and gastroesophageal reflux disease. She has a history of left upper lobe lobectomy for adenocarcinoma of the lung with curative intent February 2010.  She is currently on clopidogrel and warfarin but is no longer taking aspirin. No overt bleeding problems.   EGD (Dr. Benson Norway) earlier this month showed inflammatory esophagitis (noneosinophilic), stricture (which was dilated) and cervical web.  She denies shortness of breath or angina pectoris. Mostly has problems with chronic knee and back pain. She rarely has ankle edema. Overall class II functional status.  Her blood pressure is well controlled and she is tolerating simvastatin. Excellent lipid profile October 2015. Not sure what her most recent hemoglobin A1c  was.   Past Medical History  Diagnosis Date  . Hypertension   . Hypercholesteremia   . Peripheral vascular disease 8/12    Lt SFA PTA  . H/O hiatal hernia   . Arthritis   . Non-STEMI (non-ST elevated myocardial infarction) 04/07/2012    See cath results below  . Presence of stent in LAD coronary artery 04/07/2012    Xience Expedition DES 2.75 mm x 18 mm (dilated to 3.0 mm)  . Paroxysmal atrial fibrillation 04/07/2012    On Warfarin  . Heart palpitations     monitor 04/19/12-05/10/12- frequent PAF with RVR- coumadin started  . Sleep apnea   . Diabetes mellitus     insulin dependent  . GERD (gastroesophageal reflux disease)     tx meds  . Cancer 5/10    LUL Vats   . Lung cancer     2010, surgery 18% left lung no radiation or chemo  . Transfusion history     last april 2015 s/p knee surgery    Past Surgical History  Procedure Laterality Date  . Abdominal hysterectomy  1960  . Cholecystectomy    . Lung surgery  5/10  . Percutaneous coronary angioplasty and stenting  04/07/2012    Xience Expedition DES 2.41m x 18 mm (dilated to 3.0 mm) place to the prox LAD   . Orif ankle fracture  07/09/2012    Procedure: OPEN REDUCTION INTERNAL FIXATION (ORIF) ANKLE FRACTURE;  Surgeon: GMeredith Pel MD;  Location: [facility];  Service: Orthopedics;  Laterality: Left;  open reduction internal fixation trimalleolar ankle fracture medial malleolous fixation  . Cardiac catheterization  11/04/2008    patent RCA, LM, and Circ, nl EF  . Cardiac catheterization  02/20/2002    patent coronaries with the only abnormality being a smooth luminla irregularity in the mid intermediate ramus branch no felt to be hemodynamically significant, nl LV  . Lower extremity angiogram  05/17/11    directional atherectomy to the prox L SFA using a LX Man TurboHawk, ballooned with a [employer] balloon   . Coronary angioplasty with stent placement  04/07/12    LAD  . Total knee revision Left 11/05/2013    Procedure: LEFT  TOTAL KNEE RESECTION;  Surgeon: Mauri Pole, MD;  Location: [facility];  Service: Orthopedics;  Laterality: Left;  . Joint replacement      left knee 2003  . Foot surgery Bilateral 1993    with screws, on screw removed  . Knee arthroscopy Bilateral     left x2, right x1  . Total knee revision Left 2007    opened in 2006 and cleaned out, 2007 revision  . Total knee revision Left 12/31/2013    Procedure: RE-INPLANTATION LEFT TOTAL KNEE ;  Surgeon: Mauri Pole, MD;  Location: [facility];  Service: Orthopedics;  Laterality: Left;  . Cardiac catheterization  05/27/14    DES to proximal LAD  . Left heart catheterization with coronary angiogram N/A 04/07/2012    Procedure: LEFT HEART CATHETERIZATION WITH CORONARY ANGIOGRAM;  Surgeon: [name], MD;  Location: [facility] CATH LAB;  Service: Cardiovascular;  Laterality: N/A;  . Percutaneous coronary stent intervention (pci-s) N/A 04/07/2012    Procedure: PERCUTANEOUS CORONARY STENT INTERVENTION (PCI-S);  Surgeon: [name], MD;  Location: [facility] CATH LAB;  Service: Cardiovascular;  Laterality: N/A;  . Left heart catheterization with coronary angiogram N/A 05/27/2014    Procedure: LEFT HEART CATHETERIZATION WITH CORONARY ANGIOGRAM;  Surgeon: [name], MD;  Location: [facility] CATH LAB;  Service: Cardiovascular;  Laterality: N/A;  . Percutaneous coronary stent intervention (pci-s)  05/27/2014    Procedure: PERCUTANEOUS CORONARY STENT INTERVENTION (PCI-S);  Surgeon: [name], MD;  Location: [facility] CATH LAB;  Service: Cardiovascular;;  prox lad  . Esophagogastroduodenoscopy N/A 05/23/2015    Procedure: ESOPHAGOGASTRODUODENOSCOPY (EGD);  Surgeon: Carol Ada, MD;  Location: [facility];  Service: Endoscopy;  Laterality: N/A;  . Tonsillectomy    . Eye surgery      catraactes 2013  . Esophagogastroduodenoscopy (egd) with propofol N/A 06/13/2015    Procedure: ESOPHAGOGASTRODUODENOSCOPY (EGD) WITH PROPOFOL;  Surgeon: Carol Ada, MD;  Location: [facility];   Service: Endoscopy;  Laterality: N/A;  . Savory dilation N/A 06/13/2015    Procedure: SAVORY DILATION;  Surgeon: Carol Ada, MD;  Location: [facility] ENDOSCOPY;  Service: Endoscopy;  Laterality: N/A;     Current Outpatient Prescriptions  Medication Sig Dispense Refill  . cholecalciferol (VITAMIN D) 1000 UNITS tablet Take 1,000 Units by mouth at bedtime.     . insulin NPH-regular Human (NOVOLIN 70/30) (70-30) 100 UNIT/ML injection Inject 25 Units into the skin 2 (two) times daily with a meal.    . lisinopril-hydrochlorothiazide (PRINZIDE,ZESTORETIC) 20-25 MG per tablet Take 1 tablet by mouth daily.    . metoCLOPramide (REGLAN) 10 MG tablet Take 5-10 mg by mouth 2 (two) times daily as needed (for nausea). 5 mg in the morning then '10mg'$  in at bedtime    . metoprolol tartrate (LOPRESSOR) 25 MG tablet TAKE 1 TABLET BY MOUTH TWICE A DAY 60 tablet 7  . pantoprazole (PROTONIX) 40 MG tablet TAKE 1 TABLET BY  MOUTH EVERY DAY 30 tablet 8  . simvastatin (ZOCOR) 40 MG tablet TAKE 1 TABLET BY MOUTH AT BEDTIME 30 tablet 2  . warfarin (COUMADIN) 7.5 MG tablet Take 1 tablet by mouth daily or as directed by coumadin clinic (Patient taking differently: Take 3.75-7.5 mg by mouth daily. Takes 1 tablet (7.5 mg total) by mouth daily except for Mondays and Fridays she takes 0.5 tablet (3.75 mg total)) 30 tablet 3   No current facility-administered medications for this visit.    Allergies:   Levofloxacin and Morphine and related    Social History:  The patient  reports that she quit smoking about 30 years ago. Her smoking use included Cigarettes. She has a 20 pack-year smoking history. She has never used smokeless tobacco. She reports that she does not drink alcohol or use illicit drugs.   Family History:  The patient's family history includes Coronary artery disease in her brother; Hypertension in her mother and sister.    ROS:  Please see the history of present illness.    Otherwise, review of systems positive for  none.   All other systems are reviewed and negative.    PHYSICAL EXAM: VS:  BP 116/60 mmHg  Pulse 68  Ht '5\' 3"'$  (1.6 m)  Wt 271 lb (122.925 kg)  BMI 48.02 kg/m2 , BMI Body mass index is 48.02 kg/(m^2).  General: Alert, oriented x3, no distress Head: no evidence of trauma, PERRL, EOMI, no exophtalmos or lid lag, no myxedema, no xanthelasma; normal ears, nose and oropharynx Neck: normal jugular venous pulsations and no hepatojugular reflux; brisk carotid pulses without delay and no carotid bruits Chest: clear to auscultation, no signs of consolidation by percussion or palpation, normal fremitus, symmetrical and full respiratory excursions Cardiovascular: normal position and quality of the apical impulse, regular rhythm, normal first and second heart sounds, no murmurs, rubs or gallops Abdomen: no tenderness or distention, no masses by palpation, no abnormal pulsatility or arterial bruits, normal bowel sounds, no hepatosplenomegaly Extremities: no clubbing, cyanosis or edema; 2+ radial, ulnar and brachial pulses bilaterally; 2+ right femoral, posterior tibial and dorsalis pedis pulses; 2+ left femoral, posterior tibial and dorsalis pedis pulses; no subclavian or femoral bruits Neurological: grossly nonfocal Psych: euthymic mood, full affect   EKG:  EKG is not ordered today.   Recent Labs: 09/10/2014: ALT 15; BUN 15.6; Creatinine 1.0; HGB 12.8; Platelets 243; Potassium 4.1; Sodium 139    Lipid Panel    Component Value Date/Time   CHOL 135 07/22/2014 1435   TRIG 83 07/22/2014 1435   HDL 46 07/22/2014 1435   CHOLHDL 2.9 07/22/2014 1435   VLDL 17 07/22/2014 1435   LDLCALC 72 07/22/2014 1435   LDLDIRECT 143* 10/10/2008 2042      Wt Readings from Last 3 Encounters:  06/26/15 271 lb (122.925 kg)  05/23/15 276 lb (125.193 kg)  01/20/15 276 lb 14.4 oz (125.601 kg)      ASSESSMENT AND PLAN:  1. CAD s/p LAD DES August 2016 - no symptoms of in-stent restenosis in the LAD artery in  the setting of uncontrolled diabetes mellitus, despite previous restenosis and a relatively small caliber vessel. Stop clopidogrel.  2. Hyperlipidemia - lipid profile is excellent, but DM/glucose control needs to improve. Has labs planned for Oct 12 with Dr. Ashby Dawes  3. Beta blocker-related fatigue, improved after we reduced the dose in half.  4. PAD (followed by Dr. [name]) - asymptomatic (but sedentary due to knee arthritis, possibly covering up claudication)  5. Paroxysmal  atrial fibrillation On warfarin, no clinically apparent events   Current medicines are reviewed at length with the patient today.  The patient does not have concerns regarding medicines.  The following changes have been made:  Stop clopidogrel  Labs/ tests ordered today include:  No orders of the defined types were placed in this encounter.    Patient Instructions  Your physician has recommended you make the following change in your medication: STOP CLOPIDOGREL (PLAVIX).  Dr. Sallyanne Kuster recommends that you schedule a follow-up appointment in: [place], CROITORU,MIHAI, MD  06/28/2015 3:51 PM    Sanda Klein, MD, [place] [phone] office [phone] pager

## 2015-06-26 NOTE — Telephone Encounter (Signed)
Metoprolol tartrate refilled 06/25/15

## 2015-06-28 ENCOUNTER — Encounter: Payer: Self-pay | Admitting: Cardiovascular Disease

## 2015-06-28 DIAGNOSIS — I5032 Chronic diastolic (congestive) heart failure: Secondary | ICD-10-CM | POA: Insufficient documentation

## 2015-07-09 DIAGNOSIS — I10 Essential (primary) hypertension: Secondary | ICD-10-CM | POA: Diagnosis not present

## 2015-07-09 DIAGNOSIS — E782 Mixed hyperlipidemia: Secondary | ICD-10-CM | POA: Diagnosis not present

## 2015-07-09 DIAGNOSIS — E1065 Type 1 diabetes mellitus with hyperglycemia: Secondary | ICD-10-CM | POA: Diagnosis not present

## 2015-07-16 ENCOUNTER — Ambulatory Visit: Payer: Commercial Managed Care - HMO | Admitting: Pharmacist Clinician (PhC)/ Clinical Pharmacy Specialist

## 2015-07-16 DIAGNOSIS — E1065 Type 1 diabetes mellitus with hyperglycemia: Secondary | ICD-10-CM | POA: Diagnosis not present

## 2015-07-16 DIAGNOSIS — Z23 Encounter for immunization: Secondary | ICD-10-CM | POA: Diagnosis not present

## 2015-07-16 DIAGNOSIS — I10 Essential (primary) hypertension: Secondary | ICD-10-CM | POA: Diagnosis not present

## 2015-07-16 DIAGNOSIS — E782 Mixed hyperlipidemia: Secondary | ICD-10-CM | POA: Diagnosis not present

## 2015-07-22 ENCOUNTER — Encounter: Payer: Self-pay | Admitting: Cardiovascular Disease

## 2015-08-13 ENCOUNTER — Telehealth: Payer: Self-pay | Admitting: Pharmacist Clinician (PhC)/ Clinical Pharmacy Specialist

## 2015-08-13 NOTE — Telephone Encounter (Signed)
Patient called--appointment for 08/18/15 at 3:20 pm

## 2015-08-18 ENCOUNTER — Ambulatory Visit (INDEPENDENT_AMBULATORY_CARE_PROVIDER_SITE_OTHER): Payer: Commercial Managed Care - HMO | Admitting: Pharmacist Clinician (PhC)/ Clinical Pharmacy Specialist

## 2015-08-18 DIAGNOSIS — Z7901 Long term (current) use of anticoagulants: Secondary | ICD-10-CM

## 2015-08-18 DIAGNOSIS — I480 Paroxysmal atrial fibrillation: Secondary | ICD-10-CM

## 2015-08-18 LAB — POCT INR: INR: 3.4

## 2015-09-02 ENCOUNTER — Ambulatory Visit: Payer: Commercial Managed Care - HMO | Admitting: Pharmacist Clinician (PhC)/ Clinical Pharmacy Specialist

## 2015-09-05 DIAGNOSIS — E1065 Type 1 diabetes mellitus with hyperglycemia: Secondary | ICD-10-CM | POA: Diagnosis not present

## 2015-09-15 ENCOUNTER — Other Ambulatory Visit (HOSPITAL_BASED_OUTPATIENT_CLINIC_OR_DEPARTMENT_OTHER): Payer: Commercial Managed Care - HMO

## 2015-09-15 ENCOUNTER — Other Ambulatory Visit: Payer: Self-pay | Admitting: Internal Medicine

## 2015-09-15 ENCOUNTER — Ambulatory Visit (HOSPITAL_COMMUNITY)
Admission: RE | Admit: 2015-09-15 | Discharge: 2015-09-15 | Disposition: A | Discharge status: Home / Self Care | Payer: Commercial Managed Care - HMO | Source: Ambulatory Visit | Attending: Internal Medicine | Admitting: Internal Medicine

## 2015-09-15 DIAGNOSIS — Z9889 Other specified postprocedural states: Secondary | ICD-10-CM | POA: Insufficient documentation

## 2015-09-15 DIAGNOSIS — R590 Localized enlarged lymph nodes: Secondary | ICD-10-CM | POA: Diagnosis not present

## 2015-09-15 DIAGNOSIS — C3492 Malignant neoplasm of unspecified part of left bronchus or lung: Secondary | ICD-10-CM | POA: Diagnosis not present

## 2015-09-15 LAB — COMPREHENSIVE METABOLIC PANEL
ALBUMIN: 3.4 g/dL — AB (ref 3.5–5.0)
ALK PHOS: 98 U/L (ref 40–150)
ALT: 13 U/L (ref 0–55)
ANION GAP: 10 meq/L (ref 3–11)
AST: 16 U/L (ref 5–34)
BILIRUBIN TOTAL: 0.6 mg/dL (ref 0.20–1.20)
BUN: 16.1 mg/dL (ref 7.0–26.0)
CO2: 27 meq/L (ref 22–29)
Calcium: 9.9 mg/dL (ref 8.4–10.4)
Chloride: 100 mEq/L (ref 98–109)
Creatinine: 1.2 mg/dL — ABNORMAL HIGH (ref 0.6–1.1)
EGFR: 57 mL/min/{1.73_m2} — AB (ref >90)
GLUCOSE: 206 mg/dL — AB (ref 70–140)
POTASSIUM: 4 meq/L (ref 3.5–5.1)
SODIUM: 137 meq/L (ref 136–145)
TOTAL PROTEIN: 7.8 g/dL (ref 6.4–8.3)

## 2015-09-15 LAB — CBC WITH DIFFERENTIAL/PLATELET
BASO%: 0.6 % (ref 0.0–2.0)
BASOS ABS: 0 10*3/uL (ref 0.0–0.1)
EOS%: 2.1 % (ref 0.0–7.0)
Eosinophils Absolute: 0.2 10*3/uL (ref 0.0–0.5)
HCT: 38.8 % (ref 34.8–46.6)
HEMOGLOBIN: 13 g/dL (ref 11.6–15.9)
LYMPH#: 1.4 10*3/uL (ref 0.9–3.3)
LYMPH%: 20.3 % (ref 14.0–49.7)
MCH: 28.7 pg (ref 25.1–34.0)
MCHC: 33.5 g/dL (ref 31.5–36.0)
MCV: 85.7 fL (ref 79.5–101.0)
MONO#: 0.6 10*3/uL (ref 0.1–0.9)
MONO%: 8.5 % (ref 0.0–14.0)
NEUT%: 68.5 % (ref 38.4–76.8)
NEUTROS ABS: 4.9 10*3/uL (ref 1.5–6.5)
NRBC: 0 % (ref 0–0)
Platelets: 164 10*3/uL (ref 145–400)
RBC: 4.53 10*6/uL (ref 3.70–5.45)
RDW: 12.9 % (ref 11.2–14.5)
WBC: 7.1 10*3/uL (ref 3.9–10.3)

## 2015-09-17 ENCOUNTER — Ambulatory Visit: Payer: Commercial Managed Care - HMO | Admitting: Pharmacist Clinician (PhC)/ Clinical Pharmacy Specialist

## 2015-09-22 ENCOUNTER — Ambulatory Visit: Payer: Commercial Managed Care - HMO | Admitting: Internal Medicine

## 2015-09-23 ENCOUNTER — Encounter: Payer: Self-pay | Admitting: Internal Medicine

## 2015-09-23 ENCOUNTER — Ambulatory Visit (HOSPITAL_BASED_OUTPATIENT_CLINIC_OR_DEPARTMENT_OTHER): Payer: Commercial Managed Care - HMO | Admitting: Internal Medicine

## 2015-09-23 VITALS — BP 148/54 | HR 70 | Temp 98.3°F | Resp 18 | Ht 63.0 in | Wt 267.8 lb

## 2015-09-23 DIAGNOSIS — C3492 Malignant neoplasm of unspecified part of left bronchus or lung: Secondary | ICD-10-CM

## 2015-09-23 DIAGNOSIS — Z85118 Personal history of other malignant neoplasm of bronchus and lung: Secondary | ICD-10-CM

## 2015-09-23 NOTE — Progress Notes (Signed)
[name] [phone]   Fax:([phone]) [place], [place] [address]  DIAGNOSIS: : Stage IA (T1b N0 MX) non-small cell lung cancer, adenocarcinoma diagnosed in February 2010.   PRIOR THERAPY: Status post left upper lobectomy with node dissection under the care of Dr. Arlyce Dice on Feb 11, 2009.   CURRENT THERAPY: Observation.  INTERVAL HISTORY: [name] 64 y.o. female returns to the clinic today for routine annual followup visit. The patient is doing fine today with no specific complaints. She is feeling better today with no significant weight loss or night sweats. She denied having any significant chest pain, shortness of breath, cough or hemoptysis. She had repeat CT scan of the chest performed recently and she is here for evaluation and discussion of her scan results.  MEDICAL HISTORY: Past Medical History  Diagnosis Date  . Hypertension   . Hypercholesteremia   . Peripheral vascular disease ([place]) 8/12    Lt SFA PTA  . H/O hiatal hernia   . Arthritis   . Non-STEMI (non-ST elevated myocardial infarction) ([place]) 04/07/2012    See cath results below  . Presence of stent in LAD coronary artery 04/07/2012    Xience Expedition DES 2.75 mm x 18 mm (dilated to 3.0 mm)  . Paroxysmal atrial fibrillation ([place]) 04/07/2012    On Warfarin  . Heart palpitations     monitor 04/19/12-05/10/12- frequent PAF with RVR- coumadin started  . Sleep apnea   . Diabetes mellitus     insulin dependent  . GERD (gastroesophageal reflux disease)     tx meds  . Cancer ([place]) 5/10    LUL Vats   . Lung cancer ([place])     2010, surgery 18% left lung no radiation or chemo  . Transfusion history     last april 2015 s/p knee surgery    ALLERGIES:  is allergic to levofloxacin and morphine and related.  MEDICATIONS:  Current Outpatient Prescriptions  Medication Sig Dispense Refill  .  cholecalciferol (VITAMIN D) 1000 UNITS tablet Take 1,000 Units by mouth at bedtime.     . insulin NPH-regular Human (NOVOLIN 70/30) (70-30) 100 UNIT/ML injection Inject 25 Units into the skin 2 (two) times daily with a meal.    . lisinopril-hydrochlorothiazide (PRINZIDE,ZESTORETIC) 20-25 MG per tablet Take 1 tablet by mouth daily.    . metoCLOPramide (REGLAN) 10 MG tablet Take 5-10 mg by mouth 2 (two) times daily as needed (for nausea). 5 mg in the morning then '10mg'$  in at bedtime    . metoprolol tartrate (LOPRESSOR) 25 MG tablet TAKE 1 TABLET BY MOUTH TWICE A DAY 60 tablet 7  . pantoprazole (PROTONIX) 40 MG tablet TAKE 1 TABLET BY MOUTH EVERY DAY 30 tablet 8  . simvastatin (ZOCOR) 40 MG tablet TAKE 1 TABLET BY MOUTH AT BEDTIME 30 tablet 2  . warfarin (COUMADIN) 7.5 MG tablet Take 1 tablet by mouth daily or as directed by coumadin clinic (Patient taking differently: Take 3.75-7.5 mg by mouth daily. Takes 1 tablet (7.5 mg total) by mouth daily except for Mondays and Fridays she takes 0.5 tablet (3.75 mg total)) 30 tablet 3   No current facility-administered medications for this visit.    SURGICAL HISTORY:  Past Surgical History  Procedure Laterality Date  . Abdominal hysterectomy  1960  . Cholecystectomy    . Lung surgery  5/10  . Percutaneous coronary angioplasty and stenting  04/07/2012  Xience Expedition DES 2.67m x 18 mm (dilated to 3.0 mm) place to the prox LAD   . Orif ankle fracture  07/09/2012    Procedure: OPEN REDUCTION INTERNAL FIXATION (ORIF) ANKLE FRACTURE;  Surgeon: GMeredith Pel MD;  [facility]: [facility];  Service: Orthopedics;  Laterality: Left;  open reduction internal fixation trimalleolar ankle fracture medial malleolous fixation  . Cardiac catheterization  11/04/2008    patent RCA, LM, and Circ, nl EF  . Cardiac catheterization  02/20/2002    patent coronaries with the only abnormality being a smooth luminla irregularity in the mid intermediate ramus branch no felt to be  hemodynamically significant, nl LV  . Lower extremity angiogram  05/17/11    directional atherectomy to the prox L SFA using a LX Man TurboHawk, ballooned with a FAgilent Technologiesballoon   . Coronary angioplasty with stent placement  04/07/12    LAD  . Total knee revision Left 11/05/2013    Procedure: LEFT TOTAL KNEE RESECTION;  Surgeon: MMauri Pole MD;  [facility];  Service: Orthopedics;  Laterality: Left;  . Joint replacement      left knee 2003  . Foot surgery Bilateral 1993    with screws, on screw removed  . Knee arthroscopy Bilateral     left x2, right x1  . Total knee revision Left 2007    opened in 2006 and cleaned out, 2007 revision  . Total knee revision Left 12/31/2013    Procedure: RE-INPLANTATION LEFT TOTAL KNEE ;  Surgeon: MMauri Pole MD;  [facility];  Service: Orthopedics;  Laterality: Left;  . Cardiac catheterization  05/27/14    DES to proximal LAD  . Left heart catheterization with coronary angiogram N/A 04/07/2012    Procedure: LEFT HEART CATHETERIZATION WITH CORONARY ANGIOGRAM;  Surgeon: JLorretta Harp MD;  [facility];  Service: Cardiovascular;  Laterality: N/A;  . Percutaneous coronary stent intervention (pci-s) N/A 04/07/2012    Procedure: PERCUTANEOUS CORONARY STENT INTERVENTION (PCI-S);  Surgeon: JLorretta Harp MD;  [facility];  Service: Cardiovascular;  Laterality: N/A;  . Left heart catheterization with coronary angiogram N/A 05/27/2014    Procedure: LEFT HEART CATHETERIZATION WITH CORONARY ANGIOGRAM;  Surgeon: JLorretta Harp MD;  [facility];  Service: Cardiovascular;  Laterality: N/A;  . Percutaneous coronary stent intervention (pci-s)  05/27/2014    Procedure: PERCUTANEOUS CORONARY STENT INTERVENTION (PCI-S);  Surgeon: JLorretta Harp MD;  [facility];  Service: Cardiovascular;;  prox lad  . Esophagogastroduodenoscopy N/A 05/23/2015    Procedure: ESOPHAGOGASTRODUODENOSCOPY (EGD);  Surgeon: PCarol Ada MD;   [facility];  Service: Endoscopy;  Laterality: N/A;  . Tonsillectomy    . Eye surgery      catraactes 2013  . Esophagogastroduodenoscopy (egd) with propofol N/A 06/13/2015    Procedure: ESOPHAGOGASTRODUODENOSCOPY (EGD) WITH PROPOFOL;  Surgeon: PCarol Ada MD;  [facility];  Service: Endoscopy;  Laterality: N/A;  . Savory dilation N/A 06/13/2015    Procedure: SAVORY DILATION;  Surgeon: PCarol Ada MD;  [facility];  Service: Endoscopy;  Laterality: N/A;    REVIEW OF SYSTEMS:  A comprehensive review of systems was negative except for: Constitutional: positive for fatigue   PHYSICAL EXAMINATION: General appearance: alert, cooperative and no distress Head: Normocephalic, without obvious abnormality, atraumatic Neck: no adenopathy Resp: clear to auscultation bilaterally Cardio: regular rate and rhythm, S1, S2 normal, no murmur, click, rub or gallop GI: soft, non-tender; bowel sounds normal; no masses,  no organomegaly Extremities: extremities normal, atraumatic, no cyanosis or edema  ECOG PERFORMANCE STATUS: 1 - Symptomatic but completely ambulatory  Blood pressure 148/54, pulse 70, temperature 98.3 F (36.8 C), temperature source Oral, resp. rate 18, height '5\' 3"'$  (1.6 m), weight 267 lb 12.8 oz (121.473 kg), SpO2 100 %.  LABORATORY DATA: Lab Results  Component Value Date   WBC 7.1 09/15/2015   HGB 13.0 09/15/2015   HCT 38.8 09/15/2015   MCV 85.7 09/15/2015   PLT 164 09/15/2015      Chemistry      Component Value Date/Time   NA 137 09/15/2015 0959   NA 138 05/28/2014 0419   NA 134 09/14/2010 0939   K 4.0 09/15/2015 0959   K 4.2 05/28/2014 0419   K 3.9 09/14/2010 0939   CL 100 05/28/2014 0419   CL 99 09/12/2012 0959   CL 96* 09/14/2010 0939   CO2 27 09/15/2015 0959   CO2 24 05/28/2014 0419   CO2 31 09/14/2010 0939   BUN 16.1 09/15/2015 0959   BUN 15 05/28/2014 0419   BUN 14 09/14/2010 0939   CREATININE 1.2* 09/15/2015 0959    CREATININE 0.90 05/28/2014 0419   CREATININE 0.91 05/23/2014 1515      Component Value Date/Time   CALCIUM 9.9 09/15/2015 0959   CALCIUM 9.3 05/28/2014 0419   CALCIUM 9.3 09/14/2010 0939   ALKPHOS 98 09/15/2015 0959   ALKPHOS 81 09/09/2011 0958   ALKPHOS 93* 09/14/2010 0939   AST 16 09/15/2015 0959   AST 17 09/09/2011 0958   AST 23 09/14/2010 0939   ALT 13 09/15/2015 0959   ALT 11 09/09/2011 0958   ALT 18 09/14/2010 0939   BILITOT 0.60 09/15/2015 0959   BILITOT 0.5 09/09/2011 0958   BILITOT 0.60 09/14/2010 0939       RADIOGRAPHIC STUDIES: Ct Chest Wo Contrast  09/15/2015  CLINICAL DATA:  Left lung cancer diagnosed 2010, status post surgery EXAM: CT CHEST WITHOUT CONTRAST TECHNIQUE: Multidetector CT imaging of the chest was performed following the standard protocol without IV contrast. COMPARISON:  CT chest dated 09/10/2014 FINDINGS: Mediastinum/Nodes: The heart is normal in size. No pericardial effusion. Coronary atherosclerosis.  Coronary stents in the LAD. Small mediastinal lymph nodes, including a 10 mm short axis AP window node, previously 11 mm. Visualized thyroid is unremarkable. Lungs/Pleura: Status post left upper lobectomy. No suspicious pulmonary nodules. No focal consolidation. Postsurgical changes/ scarring at the left lung base. No pleural effusion or pneumothorax. Upper abdomen: Visualized upper abdomen is notable for cholecystectomy clips, vascular calcifications, and a tiny hiatal hernia. Musculoskeletal: Mild degenerative changes of the visualized thoracolumbar spine. Postsurgical changes from left thoracotomy. IMPRESSION: Postsurgical changes in the left hemithorax from a left upper lobectomy. Small mediastinal lymph nodes measuring up to 10 mm short axis, stable versus mildly decreased. No evidence of recurrent or metastatic disease in the chest. Electronically Signed   By: Julian Hy M.D.   On: 09/15/2015 11:51   ASSESSMENT AND PLAN: This is a very pleasant 64  years old [place] American female with history of stage IA non-small cell lung cancer status post left upper lobectomy with lymph node dissection and has been observation since May of 2010. The recent CT scan of the chest showed no evidence for disease recurrence or metastatic disease. I discussed the scan results with the patient today. It has been almost 7 years since her diagnosis. I recommended for the patient to continue routine follow-up visit with her primary care physician at  this point. I would see her on as needed basis in the future. She was advised to call me immediately if she has any concerning symptoms in the interval.  All questions were answered. The patient knows to call the clinic with any problems, questions or concerns. We can certainly see the patient much sooner if necessary.  Disclaimer: This note was dictated with voice recognition software. Similar sounding words can inadvertently be transcribed and may be missed upon review.

## 2015-09-30 ENCOUNTER — Ambulatory Visit (INDEPENDENT_AMBULATORY_CARE_PROVIDER_SITE_OTHER): Payer: Commercial Managed Care - HMO | Admitting: Pharmacist Clinician (PhC)/ Clinical Pharmacy Specialist

## 2015-09-30 DIAGNOSIS — I480 Paroxysmal atrial fibrillation: Secondary | ICD-10-CM

## 2015-09-30 DIAGNOSIS — Z7901 Long term (current) use of anticoagulants: Secondary | ICD-10-CM | POA: Diagnosis not present

## 2015-09-30 LAB — POCT INR: INR: 2.8

## 2015-11-03 ENCOUNTER — Ambulatory Visit (INDEPENDENT_AMBULATORY_CARE_PROVIDER_SITE_OTHER): Payer: Commercial Managed Care - HMO | Admitting: Pharmacist Clinician (PhC)/ Clinical Pharmacy Specialist

## 2015-11-03 DIAGNOSIS — I480 Paroxysmal atrial fibrillation: Secondary | ICD-10-CM

## 2015-11-03 DIAGNOSIS — Z7901 Long term (current) use of anticoagulants: Secondary | ICD-10-CM | POA: Diagnosis not present

## 2015-11-03 LAB — POCT INR: INR: 3.4

## 2015-11-04 DIAGNOSIS — I10 Essential (primary) hypertension: Secondary | ICD-10-CM | POA: Diagnosis not present

## 2015-11-04 DIAGNOSIS — E1065 Type 1 diabetes mellitus with hyperglycemia: Secondary | ICD-10-CM | POA: Diagnosis not present

## 2015-11-04 DIAGNOSIS — E782 Mixed hyperlipidemia: Secondary | ICD-10-CM | POA: Diagnosis not present

## 2015-11-04 DIAGNOSIS — I482 Chronic atrial fibrillation: Secondary | ICD-10-CM | POA: Diagnosis not present

## 2015-11-24 ENCOUNTER — Ambulatory Visit (INDEPENDENT_AMBULATORY_CARE_PROVIDER_SITE_OTHER): Payer: Commercial Managed Care - HMO | Admitting: Pharmacist Clinician (PhC)/ Clinical Pharmacy Specialist

## 2015-11-24 DIAGNOSIS — I480 Paroxysmal atrial fibrillation: Secondary | ICD-10-CM | POA: Diagnosis not present

## 2015-11-24 DIAGNOSIS — Z7901 Long term (current) use of anticoagulants: Secondary | ICD-10-CM | POA: Diagnosis not present

## 2015-11-24 LAB — POCT INR: INR: 3

## 2015-12-08 DIAGNOSIS — E1065 Type 1 diabetes mellitus with hyperglycemia: Secondary | ICD-10-CM | POA: Diagnosis not present

## 2015-12-16 DIAGNOSIS — I482 Chronic atrial fibrillation: Secondary | ICD-10-CM | POA: Diagnosis not present

## 2015-12-16 DIAGNOSIS — E1065 Type 1 diabetes mellitus with hyperglycemia: Secondary | ICD-10-CM | POA: Diagnosis not present

## 2015-12-16 DIAGNOSIS — I10 Essential (primary) hypertension: Secondary | ICD-10-CM | POA: Diagnosis not present

## 2015-12-16 DIAGNOSIS — Z0000 Encounter for general adult medical examination without abnormal findings: Secondary | ICD-10-CM | POA: Diagnosis not present

## 2015-12-16 DIAGNOSIS — E782 Mixed hyperlipidemia: Secondary | ICD-10-CM | POA: Diagnosis not present

## 2015-12-23 DIAGNOSIS — Z23 Encounter for immunization: Secondary | ICD-10-CM | POA: Diagnosis not present

## 2015-12-23 DIAGNOSIS — E782 Mixed hyperlipidemia: Secondary | ICD-10-CM | POA: Diagnosis not present

## 2015-12-23 DIAGNOSIS — I482 Chronic atrial fibrillation: Secondary | ICD-10-CM | POA: Diagnosis not present

## 2015-12-23 DIAGNOSIS — E1065 Type 1 diabetes mellitus with hyperglycemia: Secondary | ICD-10-CM | POA: Diagnosis not present

## 2015-12-23 DIAGNOSIS — I10 Essential (primary) hypertension: Secondary | ICD-10-CM | POA: Diagnosis not present

## 2015-12-24 ENCOUNTER — Ambulatory Visit (INDEPENDENT_AMBULATORY_CARE_PROVIDER_SITE_OTHER): Payer: Commercial Managed Care - HMO | Admitting: Cardiovascular Disease

## 2015-12-24 ENCOUNTER — Encounter: Payer: Self-pay | Admitting: Cardiovascular Disease

## 2015-12-24 ENCOUNTER — Ambulatory Visit (INDEPENDENT_AMBULATORY_CARE_PROVIDER_SITE_OTHER): Payer: Commercial Managed Care - HMO | Admitting: *Deleted

## 2015-12-24 VITALS — BP 120/64 | HR 64 | Ht 63.0 in | Wt 262.0 lb

## 2015-12-24 DIAGNOSIS — I2510 Atherosclerotic heart disease of native coronary artery without angina pectoris: Secondary | ICD-10-CM

## 2015-12-24 DIAGNOSIS — I5032 Chronic diastolic (congestive) heart failure: Secondary | ICD-10-CM

## 2015-12-24 DIAGNOSIS — I480 Paroxysmal atrial fibrillation: Secondary | ICD-10-CM | POA: Diagnosis not present

## 2015-12-24 DIAGNOSIS — I739 Peripheral vascular disease, unspecified: Secondary | ICD-10-CM | POA: Diagnosis not present

## 2015-12-24 DIAGNOSIS — Z7901 Long term (current) use of anticoagulants: Secondary | ICD-10-CM | POA: Diagnosis not present

## 2015-12-24 DIAGNOSIS — I255 Ischemic cardiomyopathy: Secondary | ICD-10-CM | POA: Diagnosis not present

## 2015-12-24 DIAGNOSIS — E1151 Type 2 diabetes mellitus with diabetic peripheral angiopathy without gangrene: Secondary | ICD-10-CM

## 2015-12-24 DIAGNOSIS — I10 Essential (primary) hypertension: Secondary | ICD-10-CM

## 2015-12-24 DIAGNOSIS — E785 Hyperlipidemia, unspecified: Secondary | ICD-10-CM

## 2015-12-24 LAB — POCT INR: INR: 2.8

## 2015-12-24 NOTE — Patient Instructions (Signed)
Medication Instructions:  NO MEDICATION CHANGES  Labwork: NONE  Testing/Procedures: NONE  Follow-Up: IN ONE YEAR WITH DR CROITORU  Any Other Special Instructions Will Be Listed Below (If Applicable).     If you need a refill on your cardiac medications before your next appointment, please call your pharmacy.

## 2015-12-24 NOTE — Progress Notes (Signed)
Patient ID: [name], female   DOB: [birth year], 65 y.o.   MRN: [id number]    Cardiology Office Note    Date:  12/25/2015   ID:  [name], DOB [birth year], MRN [id number]  PCP:  Georgianne Fick, MD  Cardiologist:   Thurmon Fair, MD   Chief Complaint  Patient presents with  . Follow-up    no chest pain, no shortness of breath, has edema in Left ankle, no lightheaded or dizziness    History of Present Illness:  [name] is a 65 y.o. female with CAD and PAD, ischemic cardiomyopathy and paroxysmal atrial fibrillation.  She feels well. She denies any chest pain at rest exertion, dyspnea at rest or with exertion, orthopnea, paroxysmal nocturnal dyspnea, syncope, palpitations, focal neurological deficits, intermittent claudication, lower extremity edema, unexplained weight gain, cough, hemoptysis or wheezing. Activity is limited by left knee pain. She uses a cane.  Labs checked very recently with Dr. Nicholos Johns. Told her lipid profile was very good, unfortunately hemoglobin A1c was elevated at 8.9%. Today INR 2.8. No bleeding problems on chronic warfarin anticoagulation.  In 2013 she received a drug-eluting stent to the LAD artery. In [id number] she had high-grade in-stent restenosis treated with balloon angioplasty and placement of a new 312 mm drug-eluting Xience expedition stent to the distal edge of the previous stent. Incidental note made of a 50-60 percent mid AV groove left circumflex stenosis She has been angina free since. She has normal left ventricular systolic function and no congestive heart failure.  In addition she has history of PAD status post atherectomy of the left superficial femoral artery and paroxysmal atrial fibrillation (on chronic warfarin anticoagulation). Additional comorbid conditions include hypertension, obesity, type 2 diabetes mellitus and gastroesophageal reflux disease. She has a history of left upper lobe lobectomy for adenocarcinoma  of the lung with curative intent February 2010.  She has a history of inflammatory esophagitis (noneosinophilic), stricture (which was dilated) and cervical web. (Dr. Elnoria Howard, EGD 2016)  Past Medical History  Diagnosis Date  . Hypertension   . Hypercholesteremia   . Peripheral vascular disease ([facility]) 8/12    Lt SFA PTA  . H/O hiatal hernia   . Arthritis   . Non-STEMI (non-ST elevated myocardial infarction) ([facility]) 04/07/2012    See cath results below  . Presence of stent in LAD coronary artery 04/07/2012    Xience Expedition DES 2.75 mm x 18 mm (dilated to 3.0 mm)  . Paroxysmal atrial fibrillation ([facility]) 04/07/2012    On Warfarin  . Heart palpitations     monitor 04/19/12-05/10/12- frequent PAF with RVR- coumadin started  . Sleep apnea   . Diabetes mellitus     insulin dependent  . GERD (gastroesophageal reflux disease)     tx meds  . Cancer ([facility]) 5/10    LUL Vats   . Lung cancer ([facility])     2010, surgery 18% left lung no radiation or chemo  . Transfusion history     last april 2015 s/p knee surgery    Past Surgical History  Procedure Laterality Date  . Abdominal hysterectomy  1960  . Cholecystectomy    . Lung surgery  5/10  . Percutaneous coronary angioplasty and stenting  04/07/2012    Xience Expedition DES 2.68mm x 18 mm (dilated to 3.0 mm) place to the prox LAD   . Orif ankle fracture  07/09/2012    Procedure: OPEN REDUCTION INTERNAL FIXATION (ORIF) ANKLE FRACTURE;  Surgeon: Cammy Copa, MD;  Location: [facility];  Service: Orthopedics;  Laterality: Left;  open reduction internal fixation trimalleolar ankle fracture medial malleolous fixation  . Cardiac catheterization  11/04/2008    patent RCA, LM, and Circ, nl EF  . Cardiac catheterization  02/20/2002    patent coronaries with the only abnormality being a smooth luminla irregularity in the mid intermediate ramus branch no felt to be hemodynamically significant, nl LV  . Lower extremity angiogram  05/17/11    directional  atherectomy to the prox L SFA using a LX Man TurboHawk, ballooned with a [employer] balloon   . Coronary angioplasty with stent placement  04/07/12    LAD  . Total knee revision Left 11/05/2013    Procedure: LEFT TOTAL KNEE RESECTION;  Surgeon: Shelda Pal, MD;  [facility];  Service: Orthopedics;  Laterality: Left;  . Joint replacement      left knee 2003  . Foot surgery Bilateral 1993    with screws, on screw removed  . Knee arthroscopy Bilateral     left x2, right x1  . Total knee revision Left 2007    opened in 2006 and cleaned out, 2007 revision  . Total knee revision Left 12/31/2013    Procedure: RE-INPLANTATION LEFT TOTAL KNEE ;  Surgeon: Shelda Pal, MD;  [facility]: [facility];  Service: Orthopedics;  Laterality: Left;  . Cardiac catheterization  05/27/14    DES to proximal LAD  . Left heart catheterization with coronary angiogram N/A 04/07/2012    Procedure: LEFT HEART CATHETERIZATION WITH CORONARY ANGIOGRAM;  Surgeon: Runell Gess, MD;  Location: [facility] CATH LAB;  Service: Cardiovascular;  Laterality: N/A;  . Percutaneous coronary stent intervention (pci-s) N/A 04/07/2012    Procedure: PERCUTANEOUS CORONARY STENT INTERVENTION (PCI-S);  Surgeon: Runell Gess, MD;  Location: [facility] CATH LAB;  Service: Cardiovascular;  Laterality: N/A;  . Left heart catheterization with coronary angiogram N/A 05/27/2014    Procedure: LEFT HEART CATHETERIZATION WITH CORONARY ANGIOGRAM;  Surgeon: Runell Gess, MD;  Location: [facility] CATH LAB;  Service: Cardiovascular;  Laterality: N/A;  . Percutaneous coronary stent intervention (pci-s)  05/27/2014    Procedure: PERCUTANEOUS CORONARY STENT INTERVENTION (PCI-S);  Surgeon: Runell Gess, MD;  Location: [facility] CATH LAB;  Service: Cardiovascular;;  prox lad  . Esophagogastroduodenoscopy N/A 05/23/2015    Procedure: ESOPHAGOGASTRODUODENOSCOPY (EGD);  Surgeon: Jeani Hawking, MD;  Location: [facility];  Service: Endoscopy;  Laterality: N/A;  . Tonsillectomy    .  Eye surgery      catraactes 2013  . Esophagogastroduodenoscopy (egd) with propofol N/A 06/13/2015    Procedure: ESOPHAGOGASTRODUODENOSCOPY (EGD) WITH PROPOFOL;  Surgeon: Jeani Hawking, MD;  [facility];  Service: Endoscopy;  Laterality: N/A;  . Savory dilation N/A 06/13/2015    Procedure: SAVORY DILATION;  Surgeon: Jeani Hawking, MD;  [facility];  Service: Endoscopy;  Laterality: N/A;    Current Medications: Outpatient Prescriptions Prior to Visit  Medication Sig Dispense Refill  . cholecalciferol (VITAMIN D) 1000 UNITS tablet Take 1,000 Units by mouth at bedtime.     . insulin NPH-regular Human (NOVOLIN 70/30) (70-30) 100 UNIT/ML injection Inject into the skin 2 (two) times daily with a meal. Take 32 units in the morning 28 units in the evening    . lisinopril-hydrochlorothiazide (PRINZIDE,ZESTORETIC) 20-25 MG per tablet Take 1 tablet by mouth daily.    . metoCLOPramide (REGLAN) 10 MG tablet Take 5-10 mg by mouth 2 (two) times daily as needed (for nausea). 5 mg in the morning then 10mg   in at bedtime    . metoprolol tartrate (LOPRESSOR) 25 MG tablet TAKE 1 TABLET BY MOUTH TWICE A DAY 60 tablet 7  . pantoprazole (PROTONIX) 40 MG tablet TAKE 1 TABLET BY MOUTH EVERY DAY 30 tablet 8  . simvastatin (ZOCOR) 40 MG tablet TAKE 1 TABLET BY MOUTH AT BEDTIME 30 tablet 2  . warfarin (COUMADIN) 7.5 MG tablet Take 1 tablet by mouth daily or as directed by coumadin clinic (Patient taking differently: Take 3.75-7.5 mg by mouth daily. Takes 1 tablet (7.5 mg total) by mouth daily except for Mondays and Fridays she takes 0.5 tablet (3.75 mg total)) 30 tablet 3   No facility-administered medications prior to visit.     Allergies:   Levofloxacin and Morphine and related   Social History   Social History  . Marital Status: Single    Spouse Name: N/A  . Number of Children: N/A  . Years of Education: N/A   Social History Main Topics  . Smoking status: Former Smoker -- 1.00 packs/day for  20 years    Types: Cigarettes    Quit date: 09/27/1984  . Smokeless tobacco: Never Used  . Alcohol Use: No  . Drug Use: No  . Sexual Activity: Not Asked   Other Topics Concern  . None   Social History Narrative     Family History:  The patient's family history includes Coronary artery disease in her brother; Hypertension in her mother and sister.   ROS:   Please see the history of present illness.    ROS All other systems reviewed and are negative.   PHYSICAL EXAM:   VS:  BP 120/64 mmHg  Pulse 64  Ht 5\' 3"  (1.6 m)  Wt 118.842 kg (262 lb)  BMI 46.42 kg/m2   GEN: Well nourished, well developed, in no acute distress HEENT: normal Neck: no JVD, carotid bruits, or masses Cardiac: RRR; no murmurs, rubs, or gallops,no edema  Respiratory:  clear to auscultation bilaterally, normal work of breathing GI: soft, nontender, nondistended, + BS MS: no deformity or atrophy Skin: warm and dry, no rash Neuro:  Alert and Oriented x 3, Strength and sensation are intact Psych: euthymic mood, full affect  Wt Readings from Last 3 Encounters:  12/24/15 118.842 kg (262 lb)  09/23/15 121.473 kg (267 lb 12.8 oz)  06/26/15 122.925 kg (271 lb)      Studies/Labs Reviewed:   EKG:  EKG is ordered today.  The ekg ordered today demonstrates NSR, RBBB, leftward axis  Recent Labs: 09/15/2015: ALT 13; BUN 16.1; Creatinine 1.2*; HGB 13.0; Platelets 164; Potassium 4.0; Sodium 137   Lipid Panel    Component Value Date/Time   CHOL 135 07/22/2014 1435   TRIG 83 07/22/2014 1435   HDL 46 07/22/2014 1435   CHOLHDL 2.9 07/22/2014 1435   VLDL 17 07/22/2014 1435   LDLCALC 72 07/22/2014 1435   LDLDIRECT 143* 10/10/2008 2042    ASSESSMENT:    1. Chronic diastolic heart failure, NYHA class 2 (HCC)   2. ICM, EF 40-45% cath 04/07/12- improved to 50-60% by echo Oct 2013   3. Coronary artery disease involving native coronary artery of native heart without angina pectoris   4. PVD (peripheral vascular  disease), Lt SFA PTA Aug 2012   5. Essential hypertension   6. DM (diabetes mellitus), type 2 with peripheral vascular complications (HCC)   7. Dyslipidemia   8. Morbid obesity due to excess calories (HCC)   9. Paroxysmal atrial fibrillation  PLAN:  In order of problems listed above:  1. CHF: Appears euvolemic. Limited by knee problems, not dyspnea. 2. Ischemic CMP, improved 3. CAD: asymptomatic. 4. PAD: no claudication at her level of activity 5. HTN: well controlled 6. DM, poorly controlled:  A1c 8.9% 7. HLP:  She reports a good lipid profile, will get the results 8. Obesity: discussed calorie restriction, avoiding carbs with high glycemic index. 9. PAF: no clinical events; on warfarin without bleeding complications, INR 2.8 today. CHADSVasc 6 (age, gender, DM, CHF, CAD/PAD, HTN).    Medication Adjustments/Labs and Tests Ordered: Current medicines are reviewed at length with the patient today.  Concerns regarding medicines are outlined above.  Medication changes, Labs and Tests ordered today are listed in the Patient Instructions below. Patient Instructions  Medication Instructions:  NO MEDICATION CHANGES  Labwork: NONE  Testing/Procedures: NONE  Follow-Up: IN ONE YEAR WITH DR Trea Carnegie  Any Other Special Instructions Will Be Listed Below (If Applicable).     If you need a refill on your cardiac medications before your next appointment, please call your pharmacy.       Joie Bimler, MD  12/25/2015 6:23 PM    [facility] [address] [place], [place], [place]  [address] Phone: [phone]; Fax: [phone]

## 2016-01-05 ENCOUNTER — Emergency Department (HOSPITAL_COMMUNITY): Payer: Commercial Managed Care - HMO

## 2016-01-05 ENCOUNTER — Encounter (HOSPITAL_COMMUNITY): Payer: Self-pay | Admitting: Emergency Medicine

## 2016-01-05 ENCOUNTER — Observation Stay (HOSPITAL_COMMUNITY)
Admission: EM | Admit: 2016-01-05 | Discharge: 2016-01-06 | Disposition: A | Discharge status: Home / Self Care | Payer: Commercial Managed Care - HMO | Attending: Internal Medicine | Admitting: Internal Medicine

## 2016-01-05 DIAGNOSIS — K219 Gastro-esophageal reflux disease without esophagitis: Secondary | ICD-10-CM | POA: Diagnosis not present

## 2016-01-05 DIAGNOSIS — Z7984 Long term (current) use of oral hypoglycemic drugs: Secondary | ICD-10-CM | POA: Diagnosis not present

## 2016-01-05 DIAGNOSIS — I739 Peripheral vascular disease, unspecified: Secondary | ICD-10-CM | POA: Diagnosis not present

## 2016-01-05 DIAGNOSIS — I129 Hypertensive chronic kidney disease with stage 1 through stage 4 chronic kidney disease, or unspecified chronic kidney disease: Secondary | ICD-10-CM | POA: Insufficient documentation

## 2016-01-05 DIAGNOSIS — Z85118 Personal history of other malignant neoplasm of bronchus and lung: Secondary | ICD-10-CM | POA: Insufficient documentation

## 2016-01-05 DIAGNOSIS — E1122 Type 2 diabetes mellitus with diabetic chronic kidney disease: Secondary | ICD-10-CM | POA: Diagnosis not present

## 2016-01-05 DIAGNOSIS — Z79899 Other long term (current) drug therapy: Secondary | ICD-10-CM | POA: Diagnosis not present

## 2016-01-05 DIAGNOSIS — E7800 Pure hypercholesterolemia, unspecified: Secondary | ICD-10-CM | POA: Diagnosis not present

## 2016-01-05 DIAGNOSIS — Z87891 Personal history of nicotine dependence: Secondary | ICD-10-CM | POA: Diagnosis not present

## 2016-01-05 DIAGNOSIS — G4730 Sleep apnea, unspecified: Secondary | ICD-10-CM | POA: Diagnosis not present

## 2016-01-05 DIAGNOSIS — I252 Old myocardial infarction: Secondary | ICD-10-CM | POA: Insufficient documentation

## 2016-01-05 DIAGNOSIS — Z7901 Long term (current) use of anticoagulants: Secondary | ICD-10-CM | POA: Diagnosis not present

## 2016-01-05 DIAGNOSIS — Z96652 Presence of left artificial knee joint: Secondary | ICD-10-CM | POA: Insufficient documentation

## 2016-01-05 DIAGNOSIS — Z794 Long term (current) use of insulin: Secondary | ICD-10-CM | POA: Insufficient documentation

## 2016-01-05 DIAGNOSIS — I480 Paroxysmal atrial fibrillation: Secondary | ICD-10-CM | POA: Diagnosis present

## 2016-01-05 DIAGNOSIS — R079 Chest pain, unspecified: Secondary | ICD-10-CM | POA: Diagnosis not present

## 2016-01-05 DIAGNOSIS — E1169 Type 2 diabetes mellitus with other specified complication: Secondary | ICD-10-CM | POA: Diagnosis present

## 2016-01-05 DIAGNOSIS — E785 Hyperlipidemia, unspecified: Secondary | ICD-10-CM | POA: Diagnosis present

## 2016-01-05 DIAGNOSIS — I10 Essential (primary) hypertension: Secondary | ICD-10-CM | POA: Diagnosis present

## 2016-01-05 DIAGNOSIS — R0789 Other chest pain: Secondary | ICD-10-CM | POA: Diagnosis not present

## 2016-01-05 DIAGNOSIS — N183 Chronic kidney disease, stage 3 unspecified: Secondary | ICD-10-CM | POA: Diagnosis present

## 2016-01-05 DIAGNOSIS — I2510 Atherosclerotic heart disease of native coronary artery without angina pectoris: Secondary | ICD-10-CM | POA: Diagnosis not present

## 2016-01-05 DIAGNOSIS — E119 Type 2 diabetes mellitus without complications: Secondary | ICD-10-CM

## 2016-01-05 DIAGNOSIS — I517 Cardiomegaly: Secondary | ICD-10-CM | POA: Diagnosis not present

## 2016-01-05 DIAGNOSIS — Z955 Presence of coronary angioplasty implant and graft: Secondary | ICD-10-CM | POA: Insufficient documentation

## 2016-01-05 DIAGNOSIS — IMO0001 Reserved for inherently not codable concepts without codable children: Secondary | ICD-10-CM

## 2016-01-05 DIAGNOSIS — I4510 Unspecified right bundle-branch block: Secondary | ICD-10-CM | POA: Diagnosis not present

## 2016-01-05 DIAGNOSIS — I255 Ischemic cardiomyopathy: Secondary | ICD-10-CM | POA: Insufficient documentation

## 2016-01-05 DIAGNOSIS — Z9049 Acquired absence of other specified parts of digestive tract: Secondary | ICD-10-CM | POA: Insufficient documentation

## 2016-01-05 LAB — CBC
HEMATOCRIT: 39.9 % (ref 36.0–46.0)
HEMOGLOBIN: 13.7 g/dL (ref 12.0–15.0)
MCH: 28.7 pg (ref 26.0–34.0)
MCHC: 34.3 g/dL (ref 30.0–36.0)
MCV: 83.6 fL (ref 78.0–100.0)
Platelets: 237 10*3/uL (ref 150–400)
RBC: 4.77 MIL/uL (ref 3.87–5.11)
RDW: 12.6 % (ref 11.5–15.5)
WBC: 9.3 10*3/uL (ref 4.0–10.5)

## 2016-01-05 LAB — BASIC METABOLIC PANEL
Anion gap: 10 (ref 5–15)
BUN: 15 mg/dL (ref 6–20)
CALCIUM: 10.2 mg/dL (ref 8.9–10.3)
CO2: 29 mmol/L (ref 22–32)
Chloride: 99 mmol/L — ABNORMAL LOW (ref 101–111)
Creatinine, Ser: 1.28 mg/dL — ABNORMAL HIGH (ref 0.44–1.00)
GFR calc Af Amer: 49 mL/min — ABNORMAL LOW (ref >60)
GFR, EST NON AFRICAN AMERICAN: 43 mL/min — AB (ref >60)
GLUCOSE: 182 mg/dL — AB (ref 65–99)
Potassium: 4 mmol/L (ref 3.5–5.1)
Sodium: 138 mmol/L (ref 135–145)

## 2016-01-05 LAB — I-STAT TROPONIN, ED: TROPONIN I, POC: 0 ng/mL (ref 0.00–0.08)

## 2016-01-05 MED ORDER — ASPIRIN 81 MG PO CHEW
324.0000 mg | CHEWABLE_TABLET | Freq: Once | ORAL | Status: AC
  Administered 2016-01-06: 324 mg via ORAL
  Filled 2016-01-05: qty 4

## 2016-01-05 NOTE — ED Provider Notes (Signed)
CSN: [id number]     Arrival date & time 01/05/16  2051 History   First MD Initiated Contact with Patient 01/05/16 2313     Chief Complaint  Patient presents with  . Chest Pain     (Consider location/radiation/quality/duration/timing/severity/associated sxs/prior Treatment) HPI Comments: Patient with PMH of NSTEMI, stent in LAD (2013), HTN, DM, HL, presents to the ED with a chief complaint of CP.  She states that the pain started early this afternoon.  The pain started at rest. She reports that it lasted several minutes, then went away. She has had several episodes of this intermittent pain all day. Last episode was approximately half an hour ago. She denies any associated shortness breath or radiating pain. She states that she has felt slightly nauseated, but denies any diaphoresis.  There is no exertional component. She is currently pain free.  The history is provided by the patient. No language interpreter was used.    Past Medical History  Diagnosis Date  . Hypertension   . Hypercholesteremia   . Peripheral vascular disease ([place]) 8/12    Lt SFA PTA  . H/O hiatal hernia   . Arthritis   . Non-STEMI (non-ST elevated myocardial infarction) ([place]) 04/07/2012    See cath results below  . Presence of stent in LAD coronary artery 04/07/2012    Xience Expedition DES 2.75 mm x 18 mm (dilated to 3.0 mm)  . Paroxysmal atrial fibrillation ([place]) 04/07/2012    On Warfarin  . Heart palpitations     monitor 04/19/12-05/10/12- frequent PAF with RVR- coumadin started  . Sleep apnea   . Diabetes mellitus     insulin dependent  . GERD (gastroesophageal reflux disease)     tx meds  . Cancer ([place]) 5/10    LUL Vats   . Lung cancer ([place])     2010, surgery 18% left lung no radiation or chemo  . Transfusion history     last april 2015 s/p knee surgery   Past Surgical History  Procedure Laterality Date  . Abdominal hysterectomy  1960  . Cholecystectomy    . Lung surgery  5/10  . Percutaneous  coronary angioplasty and stenting  04/07/2012    Xience Expedition DES 2.25m x 18 mm (dilated to 3.0 mm) place to the prox LAD   . Orif ankle fracture  07/09/2012    Procedure: OPEN REDUCTION INTERNAL FIXATION (ORIF) ANKLE FRACTURE;  Surgeon: GMeredith Pel MD;  Location: [facility];  Service: Orthopedics;  Laterality: Left;  open reduction internal fixation trimalleolar ankle fracture medial malleolous fixation  . Cardiac catheterization  11/04/2008    patent RCA, LM, and Circ, nl EF  . Cardiac catheterization  02/20/2002    patent coronaries with the only abnormality being a smooth luminla irregularity in the mid intermediate ramus branch no felt to be hemodynamically significant, nl LV  . Lower extremity angiogram  05/17/11    directional atherectomy to the prox L SFA using a LX Man TurboHawk, ballooned with a F[employer] Technologiesballoon   . Coronary angioplasty with stent placement  04/07/12    LAD  . Total knee revision Left 11/05/2013    Procedure: LEFT TOTAL KNEE RESECTION;  Surgeon: MMauri Pole MD;  Location: [facility];  Service: Orthopedics;  Laterality: Left;  . Joint replacement      left knee 2003  . Foot surgery Bilateral 1993    with screws, on screw removed  . Knee arthroscopy Bilateral     left x2,  right x1  . Total knee revision Left 2007    opened in 2006 and cleaned out, 2007 revision  . Total knee revision Left 12/31/2013    Procedure: RE-INPLANTATION LEFT TOTAL KNEE ;  Surgeon: Mauri Pole, MD;  Location: [facility];  Service: Orthopedics;  Laterality: Left;  . Cardiac catheterization  05/27/14    DES to proximal LAD  . Left heart catheterization with coronary angiogram N/A 04/07/2012    Procedure: LEFT HEART CATHETERIZATION WITH CORONARY ANGIOGRAM;  Surgeon: [name], MD;  Location: [facility] CATH LAB;  Service: Cardiovascular;  Laterality: N/A;  . Percutaneous coronary stent intervention (pci-s) N/A 04/07/2012    Procedure: PERCUTANEOUS CORONARY STENT INTERVENTION (PCI-S);  Surgeon:  [name], MD;  Location: [facility] CATH LAB;  Service: Cardiovascular;  Laterality: N/A;  . Left heart catheterization with coronary angiogram N/A 05/27/2014    Procedure: LEFT HEART CATHETERIZATION WITH CORONARY ANGIOGRAM;  Surgeon: [name], MD;  Location: [facility] CATH LAB;  Service: Cardiovascular;  Laterality: N/A;  . Percutaneous coronary stent intervention (pci-s)  05/27/2014    Procedure: PERCUTANEOUS CORONARY STENT INTERVENTION (PCI-S);  Surgeon: [name], MD;  Location: [facility] CATH LAB;  Service: Cardiovascular;;  prox lad  . Esophagogastroduodenoscopy N/A 05/23/2015    Procedure: ESOPHAGOGASTRODUODENOSCOPY (EGD);  Surgeon: Carol Ada, MD;  Location: [facility];  Service: Endoscopy;  Laterality: N/A;  . Tonsillectomy    . Eye surgery      catraactes 2013  . Esophagogastroduodenoscopy (egd) with propofol N/A 06/13/2015    Procedure: ESOPHAGOGASTRODUODENOSCOPY (EGD) WITH PROPOFOL;  Surgeon: Carol Ada, MD;  Location: [facility];  Service: Endoscopy;  Laterality: N/A;  . Savory dilation N/A 06/13/2015    Procedure: SAVORY DILATION;  Surgeon: Carol Ada, MD;  Location: [facility];  Service: Endoscopy;  Laterality: N/A;   Family History  Problem Relation Age of Onset  . Hypertension Mother   . Coronary artery disease Brother   . Hypertension Sister    Social History  Substance Use Topics  . Smoking status: Former Smoker -- 1.00 packs/day for 20 years    Types: Cigarettes    Quit date: 09/27/1984  . Smokeless tobacco: Never Used  . Alcohol Use: No   OB History    Gravida Para Term Preterm AB TAB SAB Ectopic Multiple Living   '1 1        1     '$ Review of Systems  Constitutional: Negative for fever and chills.  Respiratory: Negative for shortness of breath.   Cardiovascular: Negative for chest pain.  Gastrointestinal: Negative for nausea, vomiting, diarrhea and constipation.  Genitourinary: Negative for dysuria.  All other systems reviewed and are  negative.     Allergies  Levofloxacin and Morphine and related  Home Medications   Prior to Admission medications   Medication Sig Start Date End Date Taking? Authorizing Provider  cholecalciferol (VITAMIN D) 1000 UNITS tablet Take 1,000 Units by mouth at bedtime.    Yes Historical Provider, MD  glimepiride (AMARYL) 2 MG tablet Take 1 tablet by mouth daily.  12/23/15  Yes Historical Provider, MD  insulin NPH-regular Human (NOVOLIN 70/30) (70-30) 100 UNIT/ML injection Inject 28-32 Units into the skin 2 (two) times daily with a meal. Take 32 units in the morning 28 units in the evening   Yes Historical Provider, MD  lisinopril-hydrochlorothiazide (PRINZIDE,ZESTORETIC) 20-25 MG per tablet Take 1 tablet by mouth daily.   Yes Historical Provider, MD  metoCLOPramide (REGLAN) 10 MG tablet Take 10 mg by mouth  2 (two) times daily. 5 mg in the morning then '10mg'$  in at bedtime   Yes Historical Provider, MD  metoprolol tartrate (LOPRESSOR) 25 MG tablet TAKE 1 TABLET BY MOUTH TWICE A DAY 06/25/15  Yes Mihai Croitoru, MD  pantoprazole (PROTONIX) 40 MG tablet TAKE 1 TABLET BY MOUTH EVERY DAY 05/12/15  Yes Mihai Croitoru, MD  simvastatin (ZOCOR) 40 MG tablet TAKE 1 TABLET BY MOUTH AT BEDTIME 04/24/15  Yes Mihai Croitoru, MD  warfarin (COUMADIN) 7.5 MG tablet Take 1 tablet by mouth daily or as directed by coumadin clinic Patient taking differently: Take 3.75-7.5 mg by mouth daily. Takes 1 tablet (7.5 mg total) by mouth daily except for Mondays Wednesday and Fridays she takes 0.5 tablet (3.75 mg total) 12/29/14  Yes Mihai Croitoru, MD   BP 192/69 mmHg  Pulse 63  Temp(Src) 98.2 F (36.8 C) (Oral)  Resp 15  Ht '5\' 3"'$  (1.6 m)  Wt 118.842 kg  BMI 46.42 kg/m2  SpO2 100% Physical Exam  Constitutional: She is oriented to person, place, and time. She appears well-developed and well-nourished.  HENT:  Head: Normocephalic and atraumatic.  Eyes: Conjunctivae and EOM are normal. Pupils are equal, round, and reactive  to light.  Neck: Normal range of motion. Neck supple.  Cardiovascular: Normal rate and regular rhythm.  Exam reveals no gallop and no friction rub.   No murmur heard. Pulmonary/Chest: Effort normal and breath sounds normal. No respiratory distress. She has no wheezes. She has no rales. She exhibits no tenderness.  Abdominal: Soft. Bowel sounds are normal. She exhibits no distension and no mass. There is no tenderness. There is no rebound and no guarding.  Musculoskeletal: Normal range of motion. She exhibits no edema or tenderness.  Neurological: She is alert and oriented to person, place, and time.  Skin: Skin is warm and dry.  Psychiatric: She has a normal mood and affect. Her behavior is normal. Judgment and thought content normal.  Nursing note and vitals reviewed.   ED Course  Procedures (including critical care time) Labs Review Labs Reviewed  BASIC METABOLIC PANEL - Abnormal; Notable for the following:    Chloride 99 (*)    Glucose, Bld 182 (*)    Creatinine, Ser 1.28 (*)    GFR calc non Af Amer 43 (*)    GFR calc Af Amer 49 (*)    All other components within normal limits  CBC  PROTIME-INR  I-STAT TROPOININ, ED    Imaging Review Dg Chest 2 View  01/05/2016  CLINICAL DATA:  Chest pain radiating to the back, onset tonight. EXAM: CHEST  2 VIEW COMPARISON:  01/01/2014 FINDINGS: There is unchanged moderate cardiomegaly. The lungs are clear. There is no pleural effusion. Hilar and mediastinal contours are unremarkable and unchanged. IMPRESSION: Stable cardiomegaly.  No acute cardiopulmonary findings. Electronically Signed   By: Andreas [place] M.D.   On: 01/05/2016 21:25   I have personally reviewed and evaluated these images and lab results as part of my medical decision-making.   EKG Interpretation   Date/Time:  Monday January 05 2016 21:08:47 EDT Ventricular Rate:  54 PR Interval:  152 QRS Duration: 168 QT Interval:  459 QTC Calculation: 435 R Axis:     Text  Interpretation:  Sinus rhythm Right bundle branch block Left  ventricular hypertrophy When compared with ECG of 05/28/2014, Right bundle  branch block has replaced Incomplete right bundle branch block Confirmed  by [facility]  MD, DAVID ([id number]) on 01/05/2016 11:00:49 PM  MDM   Final diagnoses:  Chest pain, unspecified chest pain type    Patient with intermittent episodes of central CP tonight.  Hx of NSTEMI and stent in LAD.  No exertional or reproducible symptoms.  Chest pain free now.  Patient discussed with Dr. Roxanne Mins, who agrees with plan for observation admission for chest pain rule out.   12:43 AM Appreciate Dr. Myna Hidalgo for admitting the patient to obs.   Montine Circle, PA-C 05/10/87 [id number]  Delora Fuel, MD 97/47/18 5501

## 2016-01-05 NOTE — ED Notes (Signed)
Patients call bell not working. Facilities was notified.

## 2016-01-05 NOTE — ED Notes (Signed)
Pt from home with complaints of left sided chest pain that radiates to her back. Pt states that rest makes the pain feel better. Pt states she has a history of high cholesterol, hypertension, and DM. Pt states she has a family history of cardiac problems. Pt states that she had a stent placed 2013 that was replaced in 2015

## 2016-01-06 ENCOUNTER — Encounter (HOSPITAL_COMMUNITY): Payer: Self-pay | Admitting: Family Medicine

## 2016-01-06 ENCOUNTER — Other Ambulatory Visit: Payer: Self-pay

## 2016-01-06 ENCOUNTER — Other Ambulatory Visit: Payer: Self-pay | Admitting: Physician Assistant

## 2016-01-06 DIAGNOSIS — Z794 Long term (current) use of insulin: Secondary | ICD-10-CM

## 2016-01-06 DIAGNOSIS — K219 Gastro-esophageal reflux disease without esophagitis: Secondary | ICD-10-CM | POA: Diagnosis not present

## 2016-01-06 DIAGNOSIS — I2510 Atherosclerotic heart disease of native coronary artery without angina pectoris: Secondary | ICD-10-CM

## 2016-01-06 DIAGNOSIS — I480 Paroxysmal atrial fibrillation: Secondary | ICD-10-CM

## 2016-01-06 DIAGNOSIS — E119 Type 2 diabetes mellitus without complications: Secondary | ICD-10-CM

## 2016-01-06 DIAGNOSIS — I10 Essential (primary) hypertension: Secondary | ICD-10-CM

## 2016-01-06 DIAGNOSIS — R079 Chest pain, unspecified: Principal | ICD-10-CM

## 2016-01-06 DIAGNOSIS — E785 Hyperlipidemia, unspecified: Secondary | ICD-10-CM | POA: Diagnosis not present

## 2016-01-06 DIAGNOSIS — N183 Chronic kidney disease, stage 3 unspecified: Secondary | ICD-10-CM | POA: Diagnosis present

## 2016-01-06 LAB — TROPONIN I
Troponin I: <0.03 ng/mL (ref <0.031)
Troponin I: <0.03 ng/mL (ref <0.031)

## 2016-01-06 LAB — GLUCOSE, CAPILLARY
GLUCOSE-CAPILLARY: 161 mg/dL — AB (ref 65–99)
GLUCOSE-CAPILLARY: 195 mg/dL — AB (ref 65–99)
Glucose-Capillary: 138 mg/dL — ABNORMAL HIGH (ref 65–99)
Glucose-Capillary: 184 mg/dL — ABNORMAL HIGH (ref 65–99)

## 2016-01-06 LAB — PROTIME-INR
INR: 2.94 — ABNORMAL HIGH (ref 0.00–1.49)
INR: 3.1 — ABNORMAL HIGH (ref 0.00–1.49)
Prothrombin Time: 29.2 seconds — ABNORMAL HIGH (ref 11.6–15.2)
Prothrombin Time: 30.4 seconds — ABNORMAL HIGH (ref 11.6–15.2)

## 2016-01-06 LAB — LIPID PANEL
CHOL/HDL RATIO: 2.8 ratio
CHOLESTEROL: 119 mg/dL (ref 0–200)
HDL: 43 mg/dL (ref >40)
LDL Cholesterol: 58 mg/dL (ref 0–99)
TRIGLYCERIDES: 90 mg/dL (ref <150)
VLDL: 18 mg/dL (ref 0–40)

## 2016-01-06 MED ORDER — LISINOPRIL 20 MG PO TABS
20.0000 mg | ORAL_TABLET | Freq: Every day | ORAL | Status: DC
  Administered 2016-01-06: 20 mg via ORAL
  Filled 2016-01-06: qty 1

## 2016-01-06 MED ORDER — SODIUM CHLORIDE 0.9 % IV SOLN: 250.0000 mL | INTRAVENOUS | Status: DC | PRN

## 2016-01-06 MED ORDER — NITROGLYCERIN 0.4 MG SL SUBL: 0.4000 mg | SUBLINGUAL_TABLET | SUBLINGUAL | Status: DC | PRN

## 2016-01-06 MED ORDER — ASPIRIN EC 81 MG PO TBEC: 81.0000 mg | DELAYED_RELEASE_TABLET | Freq: Every day | ORAL | Status: DC

## 2016-01-06 MED ORDER — SODIUM CHLORIDE 0.9% FLUSH: 3.0000 mL | INTRAVENOUS | Status: DC | PRN

## 2016-01-06 MED ORDER — SODIUM CHLORIDE 0.9% FLUSH
3.0000 mL | Freq: Two times a day (BID) | INTRAVENOUS | Status: DC
  Administered 2016-01-06 (×2): 3 mL via INTRAVENOUS

## 2016-01-06 MED ORDER — METOPROLOL TARTRATE 1 MG/ML IV SOLN
5.0000 mg | Freq: Once | INTRAVENOUS | Status: AC
  Administered 2016-01-06: 5 mg via INTRAVENOUS
  Filled 2016-01-06: qty 5

## 2016-01-06 MED ORDER — ASPIRIN 81 MG PO TBEC: 81.0000 mg | DELAYED_RELEASE_TABLET | Freq: Every day | ORAL | Status: DC

## 2016-01-06 MED ORDER — HYDROCHLOROTHIAZIDE 25 MG PO TABS
25.0000 mg | ORAL_TABLET | Freq: Every day | ORAL | Status: DC
  Administered 2016-01-06: 25 mg via ORAL
  Filled 2016-01-06: qty 1

## 2016-01-06 MED ORDER — ENOXAPARIN SODIUM 40 MG/0.4ML ~~LOC~~ SOLN: 40.0000 mg | SUBCUTANEOUS | Status: DC

## 2016-01-06 MED ORDER — LISINOPRIL-HYDROCHLOROTHIAZIDE 20-25 MG PO TABS: 1.0000 | ORAL_TABLET | Freq: Every day | ORAL | Status: DC

## 2016-01-06 MED ORDER — METOCLOPRAMIDE HCL 10 MG PO TABS
10.0000 mg | ORAL_TABLET | Freq: Two times a day (BID) | ORAL | Status: DC
  Administered 2016-01-06 (×2): 10 mg via ORAL
  Filled 2016-01-06 (×3): qty 1

## 2016-01-06 MED ORDER — PANTOPRAZOLE SODIUM 40 MG PO TBEC
40.0000 mg | DELAYED_RELEASE_TABLET | Freq: Every day | ORAL | Status: DC
  Administered 2016-01-06: 40 mg via ORAL
  Filled 2016-01-06: qty 1

## 2016-01-06 MED ORDER — INSULIN ASPART 100 UNIT/ML ~~LOC~~ SOLN
0.0000 [IU] | Freq: Three times a day (TID) | SUBCUTANEOUS | Status: DC
  Administered 2016-01-06 (×3): 3 [IU] via SUBCUTANEOUS

## 2016-01-06 MED ORDER — SIMVASTATIN 40 MG PO TABS
40.0000 mg | ORAL_TABLET | Freq: Every day | ORAL | Status: DC
  Administered 2016-01-06: 40 mg via ORAL
  Filled 2016-01-06 (×2): qty 1

## 2016-01-06 MED ORDER — METOPROLOL TARTRATE 25 MG PO TABS
25.0000 mg | ORAL_TABLET | Freq: Two times a day (BID) | ORAL | Status: DC
  Administered 2016-01-06: 25 mg via ORAL
  Filled 2016-01-06: qty 1

## 2016-01-06 MED ORDER — VITAMIN D3 25 MCG (1000 UNIT) PO TABS
1000.0000 [IU] | ORAL_TABLET | Freq: Every day | ORAL | Status: DC
  Filled 2016-01-06 (×2): qty 1

## 2016-01-06 MED ORDER — ACETAMINOPHEN 325 MG PO TABS: 650.0000 mg | ORAL_TABLET | ORAL | Status: DC | PRN

## 2016-01-06 MED ORDER — INSULIN GLARGINE 100 UNIT/ML ~~LOC~~ SOLN
20.0000 [IU] | Freq: Every day | SUBCUTANEOUS | Status: DC
  Filled 2016-01-06: qty 0.2

## 2016-01-06 MED ORDER — ONDANSETRON HCL 4 MG/2ML IJ SOLN: 4.0000 mg | Freq: Four times a day (QID) | INTRAMUSCULAR | Status: DC | PRN

## 2016-01-06 NOTE — Progress Notes (Signed)
Patient seen and examined. Admitted after midnight secondary to CP. Patient with heart score of 5-6 and similar presentation in the past when she was found to have restenosis of DES. Currently CP free, troponin neg X 2. Her EKG is demonstrating RBBB which is new from last EKG recorded. Please refer to H&P written by Dr. Myna Hidalgo for further info/details on admission. Cardiology has been consulted and will see patient this morning.  Barton Dubois [phone]

## 2016-01-06 NOTE — Discharge Summary (Signed)
Physician Discharge Summary  ERICHA [name] IOX:[id number] DOB: 11-05-1950 DOA: 01/05/2016  PCP: Brandi Seashore, MD  Admit date: 01/05/2016 Discharge date: 01/06/2016  Time spent: 35 minutes  Recommendations for Outpatient Follow-up:  Repeat BMET to follow electrolytes and renal function Reassess BP and adjust medications as needed  Outpatient follow up with cardiology for Myoview.   Discharge Diagnoses:  Principal Problem:   Chest pain Active Problems:   Insulin dependent diabetes mellitus (HCC)   Essential hypertension   Paroxysmal atrial fibrillation   CAD s/p LAD DES 2013 and 2015   Dyslipidemia   GERD (gastroesophageal reflux disease)   CKD (chronic kidney disease) stage 3, GFR 30-59 ml/min   Discharge Condition: stable and improved. Will discharge home with follow with PCP in 1-2 weeks and outpatient follow up with cardiology.  Diet recommendation: heart healthy diet and low modified carbohydrates   Filed Weights   01/05/16 2101 01/06/16 0149  Weight: 118.842 kg (262 lb) 116.348 kg (256 lb 8 oz)    History of present illness:  As per H&P written by Dr. Myna [name] on 01/06/16 65 y.o. female with PMH of CAD with stent, paroxysmal atrial fibrillation on Coumadin, hypertension, insulin-dependent diabetes mellitus, hyperlipidemia, and GERD who presents to the ED with chest pain. Patient reports pain in her usual state until earlier in the night of her presentation when she developed pain in her right chest while seated on her couch watching TV. The pain subsided spontaneously after approximately 10 minutes and later recurred. Again, there was spontaneous resolution but she decided to come into the ED for evaluation. She describes the pain as burning in character, moderate in intensity, onset at rest, resolving spontaneously, and radiating from the right chest through to her back. She reports the symptoms as very similar to those that she has attributed to GERD, but also notes that  she thought she was experiencing GERD the last time she had a non-STEMI. There has been no fevers, chills, palpitations, dyspnea, nausea, or diaphoresis. She has not attempted any interventions for her symptoms prior to coming to the ED.  In ED, patient was found to be afebrile, saturating well on room air, and with vital signs stable. EKG features a sinus rhythm with right bundle branch block. Chest x-ray is notable for stable cardiomegaly but negative for acute cardiopulmonary disease. BMP features a serum creatinine of 1.28 which appears stable from prior measurement. CBC is within the normal limits and initial troponin is undetectable. Patient was given a 324 mg aspirin chew in the emergency department, remained hemodynamically stable and chest pain-free  Hospital Course:  1. Chest pain  - No pain since her arrival  - troponin X 3; no abnormalities suggesting ischemia on telemetry -EKG with RBBB, partially new from before and w/o other abnormalities -per cardiology rec's continue medication management and pursuit outpatient myoview -patient discharge in stable condition   2. CAD - DES placed to LAD in 2013; critical in-stent stenosis noted in July 2015 and tx with angioplasty and additional DES  - Has been free of CP since the procedure in July 2015  - Continue home-dose Lopressor BID, Zocor qHS, lisinopril  - Control DM and HTN  -outpatient follow up with cardiology service   3. Type II DM  -continue home hypoglycemic regimen and outpatient follow up with PCP for further medication adjustments   4. Hypertension - BP is stable at discharge - Continue home-dose lisinopril-HCTZ and metoprolol BID  -advise to follow heart healthy diet   5.  Hyperlipidemia  - Last LDL 72, HDL 46 (October 2015)  - Continue home-dose Zocor 40 mg qHS  6. Paroxysmal atrial fibrillation  - Currently in sinus rhythm  - INR is therapeutic  - CHADS-VASc 5 (age, gender, HTN, DM, CAD/PAD)  -  Continue AC with warfarin -rate control with metoprolol   7. CKD stage -stable and at baseline -monitor trend as an outpatient   8. GERD -continue PPI  Procedures:  See below for x-ray reports   Consultations:  Cardiology   Discharge Exam: Filed Vitals:   01/06/16 0614 01/06/16 1323  BP: 146/45 123/43  Pulse: 58 66  Temp: 97.7 F (36.5 C) 97.8 F (36.6 C)  Resp: 18 20    General: afebrile, no further CP, no SOB, no nausea or any other acute complaints Cardiovascular: S1 and S2, no rubs, no gallops Respiratory: CTA bilaterally Abd: soft, NT, ND, positive BS Extremities: trace edema, no cyanosis   Discharge Instructions   Discharge Instructions    Diet - low sodium heart healthy    Complete by:  As directed      Discharge instructions    Complete by:  As directed   Take medications as prescribed Maintain adequate hydration Outpatient follow up with cardiology as instructed for further evaluation of CP Arrange follow up with PCP in 1-2 weeks (hospital follow up visit) Follow heart healthy diet          Current Discharge Medication List    START taking these medications   Details  aspirin EC 81 MG EC tablet Take 1 tablet (81 mg total) by mouth daily.      CONTINUE these medications which have NOT CHANGED   Details  cholecalciferol (VITAMIN D) 1000 UNITS tablet Take 1,000 Units by mouth at bedtime.     glimepiride (AMARYL) 2 MG tablet Take 1 tablet by mouth daily.     insulin NPH-regular Human (NOVOLIN 70/30) (70-30) 100 UNIT/ML injection Inject 28-32 Units into the skin 2 (two) times daily with a meal. Take 32 units in the morning 28 units in the evening    lisinopril-hydrochlorothiazide (PRINZIDE,ZESTORETIC) 20-25 MG per tablet Take 1 tablet by mouth daily.    metoCLOPramide (REGLAN) 10 MG tablet Take 10 mg by mouth 2 (two) times daily. 5 mg in the morning then '10mg'$  in at bedtime    metoprolol tartrate (LOPRESSOR) 25 MG tablet TAKE 1 TABLET BY MOUTH  TWICE A DAY Qty: 60 tablet, Refills: 7    pantoprazole (PROTONIX) 40 MG tablet TAKE 1 TABLET BY MOUTH EVERY DAY Qty: 30 tablet, Refills: 8    simvastatin (ZOCOR) 40 MG tablet TAKE 1 TABLET BY MOUTH AT BEDTIME Qty: 30 tablet, Refills: 2    warfarin (COUMADIN) 7.5 MG tablet Take 1 tablet by mouth daily or as directed by coumadin clinic Qty: 30 tablet, Refills: 3       Allergies  Allergen Reactions  . Levofloxacin Itching  . Morphine And Related Itching   Follow-up Information    Follow up with [facility] On 01/22/2016.   Specialty:  Cardiology   Why:  Stress test at 12:30 pm, see instructions.   Contact information:   [address] [place] [phone]      Follow up with [facility] On 01/23/2016.   Specialty:  Cardiology   Why:  Get the second half of the stress test at 12:30 pm.    Contact information:   [place]  [place] [phone]      Follow up with [facility] On 01/22/2016.   Specialty:  Cardiology   Why:  Coumadin check at 1:30 pm   Contact information:   [address] [place] [phone]      Follow up with Sanda Klein, MD On 01/30/2016.   Specialty:  Cardiology   Why:  Please arrive at 9:15 am for a 9:30 appointment.   Contact information:   [address] [place] [address] [phone]       Follow up with [facility], [place]. Schedule an appointment as soon as possible for a visit in 1 week.   Specialty:  Internal Medicine   Contact information:   [address] [place] [address] [phone]       The results of significant diagnostics from this hospitalization (including imaging, microbiology, ancillary and laboratory) are listed below for reference.    Significant Diagnostic Studies: Dg Chest 2 View  01/05/2016  CLINICAL DATA:  Chest pain  radiating to the back, onset tonight. EXAM: CHEST  2 VIEW COMPARISON:  01/01/2014 FINDINGS: There is unchanged moderate cardiomegaly. The lungs are clear. There is no pleural effusion. Hilar and mediastinal contours are unremarkable and unchanged. IMPRESSION: Stable cardiomegaly.  No acute cardiopulmonary findings. Electronically Signed   By: Andreas [place] M.D.   On: 01/05/2016 21:25   Labs: Basic Metabolic Panel:  Recent Labs Lab 01/05/16 2124  NA 138  K 4.0  CL 99*  CO2 29  GLUCOSE 182*  BUN 15  CREATININE 1.28*  CALCIUM 10.2   CBC:  Recent Labs Lab 01/05/16 2124  WBC 9.3  HGB 13.7  HCT 39.9  MCV 83.6  PLT 237   Cardiac Enzymes:  Recent Labs Lab 01/06/16 0122 01/06/16 0648 01/06/16 1305  TROPONINI <0.03 <0.03 <0.03   CBG:  Recent Labs Lab 01/06/16 0214 01/06/16 0727 01/06/16 1202 01/06/16 1719  GLUCAP 138* 184* 161* 195*     Signed:  Barton Dubois MD.  [facility] 01/06/2016, 5:38 PM

## 2016-01-06 NOTE — Consult Note (Signed)
CARDIOLOGY CONSULT NOTE   Patient ID: [name] MRN: [id number] DOB/AGE: 02-10-1951 65 y.o.  Admit date: 01/05/2016  Primary Physician   Georgianne Fick, MD Primary Cardiologist   Dr Royann Shivers Reason for Consultation   Chest pain  XBM:[id number] A Buenafe is a 65 y.o. year old female with a history of CAD (LAD stent 2015)  and PAD, ischemic cardiomyopathy and paroxysmal atrial fibrillation on coumadin.   03/29, seen by Dr Royann Shivers and was doing well from a cardiac standpoint. Her ECG was reviewed and showed a right bundle branch block and left axis that were new from 2015.  Yesterday, about 6 PM, [name] was sitting watching TV when she had sudden onset of left lower chest pain below her left breast that radiated across the xiphoid area. It reached a 5/10. She describes it as an ache. It was uncomfortable but not severe. She had not eaten since breakfast. She denies shortness of breath, nausea, vomiting, or diaphoresis. When it did not resolve, she became worried about it and came to the emergency room. In the emergency room, her pain resolved as they were doing the ECG, without further intervention. She did not receive nitroglycerin.  Her blood pressure was elevated and she got IV metoprolol plus her home medications. Her chest pain has not returned.   This pain did not remind her of her previous angina. In 2015, when she got a stent to her LAD, she had chest tightness and burning in the mid sternal area which started with exertion and got worse with increased exertion. It was decreased by rest. She also had a lightheaded feeling and diaphoresis. She has not been having these symptoms recently and had none of them with the episode of pain yesterday which started at rest.  She has had no palpitations and does not think she has had any atrial fibrillation. Her mobility is very poor because of musculoskeletal issues and she does not walk or exert herself very much at  all.   Past Medical History  Diagnosis Date  . Hypertension   . Hypercholesteremia   . Peripheral vascular disease (HCC) 8/12    Lt SFA PTA  . H/O hiatal hernia   . Arthritis   . Non-STEMI (non-ST elevated myocardial infarction) (HCC) 04/07/2012    See cath results below  . Presence of stent in LAD coronary artery 04/07/2012    Xience Expedition DES 2.75 mm x 18 mm (dilated to 3.0 mm)  . Paroxysmal atrial fibrillation (HCC) 04/07/2012    On Warfarin  . Heart palpitations     monitor 04/19/12-05/10/12- frequent PAF with RVR- coumadin started  . Sleep apnea   . Diabetes mellitus     insulin dependent  . GERD (gastroesophageal reflux disease)     tx meds  . Cancer (HCC) 5/10    LUL Vats   . Lung cancer (HCC)     2010, surgery 18% left lung no radiation or chemo  . Transfusion history     last april 2015 s/p knee surgery    Past Surgical History  Procedure Laterality Date  . Abdominal hysterectomy  1960  . Cholecystectomy    . Lung surgery  5/10  . Percutaneous coronary angioplasty and stenting  04/07/2012    Xience Expedition DES 2.70mm x 18 mm (dilated to 3.0 mm) to the prox LAD   . Orif ankle fracture  07/09/2012    Procedure: OPEN REDUCTION INTERNAL FIXATION (ORIF) ANKLE FRACTURE;  Surgeon: Corrie Mckusick  August Saucer, MD;  Location: [facility];  Service: Orthopedics;  Laterality: Left;  open reduction internal fixation trimalleolar ankle fracture medial malleolous fixation  . Cardiac catheterization  11/04/2008    patent RCA, LM, and Circ, nl EF  . Cardiac catheterization  02/20/2002    patent coronaries with the only abnormality being a smooth luminla irregularity in the mid intermediate ramus branch no felt to be hemodynamically significant, nl LV  . Lower extremity angiogram  05/17/11    directional atherectomy to the prox L SFA using a LX Man Turbo[employer]awk, ballooned with a [employer] balloon   . Total knee revision Left 11/05/2013    Procedure: LEFT TOTAL KNEE RESECTION;  Surgeon: Shelda Pal, MD;  Location: [facility];  Service: Orthopedics;  Laterality: Left;  . Joint replacement Left 2003    Knee  . Foot surgery Bilateral 1993    with screws, on screw removed  . Knee arthroscopy Bilateral     left x2, right x1  . Total knee revision Left 2007    opened in 2006 and cleaned out, 2007 revision  . Total knee revision Left 12/31/2013    Procedure: RE-INPLANTATION LEFT TOTAL KNEE ;  Surgeon: Shelda Pal, MD;  Location: [facility];  Service: Orthopedics;  Laterality: Left;  . Left heart catheterization with coronary angiogram N/A 04/07/2012    Procedure: LEFT HEART CATHETERIZATION WITH CORONARY ANGIOGRAM;  Surgeon: Runell Gess, MD;  Location: [facility] CATH LAB;  Service: Cardiovascular;  Laterality: N/A;  . Percutaneous coronary stent intervention (pci-s) N/A 04/07/2012    Procedure: PERCUTANEOUS CORONARY STENT INTERVENTION (PCI-S);  Surgeon: Runell Gess, MD;  Location: [facility] CATH LAB;  Service: Cardiovascular;  Laterality: N/A;  . Left heart catheterization with coronary angiogram N/A 05/27/2014    Procedure: LEFT HEART CATHETERIZATION WITH CORONARY ANGIOGRAM;  Surgeon: Runell Gess, MD; LAD 99% ISR, CFX 50-60%, RCA (dominant) no sig dz, EF 60%  . Percutaneous coronary stent intervention (pci-s)  05/27/2014    Procedure: PERCUTANEOUS CORONARY STENT INTERVENTION (PCI-S);  Surgeon: Runell Gess, MD; 3 mm x 12 mm long Xience Xpedition DES to the proximal LAD  . Esophagogastroduodenoscopy N/A 05/23/2015    Procedure: ESOPHAGOGASTRODUODENOSCOPY (EGD);  Surgeon: Jeani Hawking, MD;  Location: [facility];  Service: Endoscopy;  Laterality: N/A;  . Tonsillectomy    . Eye surgery      catraactes 2013  . Esophagogastroduodenoscopy (egd) with propofol N/A 06/13/2015    Procedure: ESOPHAGOGASTRODUODENOSCOPY (EGD) WITH PROPOFOL;  Surgeon: Jeani Hawking, MD;  Location: [facility];  Service: Endoscopy;  Laterality: N/A;  . Savory dilation N/A 06/13/2015    Procedure: SAVORY DILATION;  Surgeon:  Jeani Hawking, MD;  Location: [facility];  Service: Endoscopy;  Laterality: N/A;    Allergies  Allergen Reactions  . Levofloxacin Itching  . Morphine And Related Itching    I have reviewed the patient's current medications . [START ON 01/07/2016] aspirin EC  81 mg Oral Daily  . cholecalciferol  1,000 Units Oral QHS  . lisinopril  20 mg Oral Daily   And  . hydrochlorothiazide  25 mg Oral Daily  . insulin aspart  0-15 Units Subcutaneous TID WC  . insulin glargine  20 Units Subcutaneous QHS  . metoCLOPramide  10 mg Oral BID  . metoprolol tartrate  25 mg Oral BID  . pantoprazole  40 mg Oral Daily  . simvastatin  40 mg Oral QHS  . sodium chloride flush  3 mL Intravenous Q12H  sodium chloride, acetaminophen, nitroGLYCERIN, ondansetron (ZOFRAN) IV, sodium chloride flush  Prior to Admission medications   Medication Sig Start Date End Date Taking? Authorizing Provider  cholecalciferol (VITAMIN D) 1000 UNITS tablet Take 1,000 Units by mouth at bedtime.    Yes Historical Provider, MD  glimepiride (AMARYL) 2 MG tablet Take 1 tablet by mouth daily.  12/23/15  Yes Historical Provider, MD  insulin NPH-regular Human (NOVOLIN 70/30) (70-30) 100 UNIT/ML injection Inject 28-32 Units into the skin 2 (two) times daily with a meal. Take 32 units in the morning 28 units in the evening   Yes Historical Provider, MD  lisinopril-hydrochlorothiazide (PRINZIDE,ZESTORETIC) 20-25 MG per tablet Take 1 tablet by mouth daily.   Yes Historical Provider, MD  metoCLOPramide (REGLAN) 10 MG tablet Take 10 mg by mouth 2 (two) times daily. 5 mg in the morning then 10mg  in at bedtime   Yes Historical Provider, MD  metoprolol tartrate (LOPRESSOR) 25 MG tablet TAKE 1 TABLET BY MOUTH TWICE A DAY 06/25/15  Yes Mihai Croitoru, MD  pantoprazole (PROTONIX) 40 MG tablet TAKE 1 TABLET BY MOUTH EVERY DAY 05/12/15  Yes Mihai Croitoru, MD  simvastatin (ZOCOR) 40 MG tablet TAKE 1 TABLET BY MOUTH AT BEDTIME 04/24/15  Yes Mihai Croitoru,  MD  warfarin (COUMADIN) 7.5 MG tablet Take 1 tablet by mouth daily or as directed by coumadin clinic Patient taking differently: Take 3.75-7.5 mg by mouth daily. Takes 1 tablet (7.5 mg total) by mouth daily except for Mondays Wednesday and Fridays she takes 0.5 tablet (3.75 mg total) 12/29/14  Yes Thurmon Fair, MD     Social History   Social History  . Marital Status: Single    Spouse Name: N/A  . Number of Children: N/A  . Years of Education: N/A   Occupational History  . Retired    Social History Main Topics  . Smoking status: Former Smoker -- 1.00 packs/day for 20 years    Types: Cigarettes    Quit date: 09/27/1984  . Smokeless tobacco: Never Used  . Alcohol Use: No  . Drug Use: No  . Sexual Activity: Not on file   Other Topics Concern  . Not on file   Social History Narrative   Patient lives alone.    Family Status  Relation Status Death Age  . Mother Deceased   . Father Deceased    Family History  Problem Relation Age of Onset  . Hypertension Mother   . Coronary artery disease Brother   . Hypertension Sister      ROS:  Full 14 point review of systems complete and found to be negative unless listed above.  Physical Exam: Blood pressure 146/45, pulse 58, temperature 97.7 F (36.5 C), temperature source Oral, resp. rate 18, height 5\' 3"  (1.6 m), weight 256 lb 8 oz (116.348 kg), SpO2 100 %.  General: Well developed, well nourished, female in no acute distress Head: Eyes PERRLA, No xanthomas.   Normocephalic and atraumatic, oropharynx without edema or exudate. Dentition:  good  Lungs: few rales, good air exchange  Heart: HRRR S1 S2, no rub/gallop, no murmur. pulses are 2+  both upper extremities, palpable in both lower extrem.   Neck: No carotid bruits. No lymphadenopathy.  JVD not elevated but difficult to assess secondary to body habitus . Abdomen: Bowel sounds present, abdomen soft and non-tender without masses or hernias noted. Msk:  No spine or cva  tenderness. No weakness, no joint deformities or effusions. Extremities: No clubbing or cyanosis.  trace  edema.  Neuro: Alert and oriented X 3. No focal deficits noted. Psych:  Good affect, responds appropriately Skin: No rashes or lesions noted.  Labs:   Lab Results  Component Value Date   WBC 9.3 01/05/2016   HGB 13.7 01/05/2016   HCT 39.9 01/05/2016   MCV 83.6 01/05/2016   PLT 237 01/05/2016    Recent Labs  01/06/16 0455  INR 3.10*     Recent Labs Lab 01/05/16 2124  NA 138  K 4.0  CL 99*  CO2 29  BUN 15  CREATININE 1.28*  CALCIUM 10.2  GLUCOSE 182*    Recent Labs  01/06/16 0122 01/06/16 0648  TROPONINI <0.03 <0.03    Recent Labs  01/05/16 2132  TROPIPOC 0.00   No results found for: BNP Lab Results  Component Value Date   CHOL 119 01/06/2016   HDL 43 01/06/2016   LDLCALC 58 01/06/2016   TRIG 90 01/06/2016   Echo:  07/08/2012 - Left ventricle: The cavity size was normal. Wall thickness was increased in a pattern of mild LVH. Systolic function was normal. The estimated ejection fraction was in the range of 55% to 60%. Wall motion was normal; there were no regional wall motion abnormalities. There is minimal evidence of diastolic dysfunction by annulus DTI diastolic velocities. - Right ventricle: The cavity size was mildly dilated.   ECG:   01/06/2016 Sinus rhythm, right bundle branch block was described in Dr. Erin Hearing note from 03/29   Radiology:  Dg Chest 2 View 01/05/2016  CLINICAL DATA:  Chest pain radiating to the back, onset tonight. EXAM: CHEST  2 VIEW COMPARISON:  01/01/2014 FINDINGS: There is unchanged moderate cardiomegaly. The lungs are clear. There is no pleural effusion. Hilar and mediastinal contours are unremarkable and unchanged. IMPRESSION: Stable cardiomegaly.  No acute cardiopulmonary findings. Electronically Signed   By: Ellery Plunk M.D.   On: 01/05/2016 21:25    ASSESSMENT AND PLAN:   The patient was  seen today by Dr. Antoine Poche, the patient evaluated and the data reviewed.   Principal Problem: 1.  Chest pain, moderate risk for cardiac etiology - Her cardiac enzymes have been negative so far - Her pain does not remind her of her previous angina. - She has not been having angina recently although her activity level is admittedly very poor. - Her ECG is unchanged from a recent ECG performed by Dr. Royann Shivers - INR is therapeutic so no cath for several days unless vitamin K given  - M.D. advise on Myoview and if that should be done as an inpatient or an outpatient  - Would not be able to get anything today, the study would have to be a 2 day study regardless so will resume diet   Otherwise, per Internal Medicine. With her history of PAF, would keep on telemetry Active Problems:   Insulin dependent diabetes mellitus (HCC)   Essential hypertension   Paroxysmal atrial fibrillation   CAD s/p LAD DES 2013 and 2015   Dyslipidemia   GERD (gastroesophageal reflux disease)   CKD (chronic kidney disease) stage 3, GFR 30-59 ml/min   Signed: Leanna Battles 01/06/2016 11:11 AM Beeper [phone]  Co-Sign MD  History and all data above reviewed.  Patient examined.  I agree with the findings as above.  The patient has had chest pain that is right sided and under her breast.  She says to me that it is different than her previous angina.  No objective evidence of ischemia.  She does  have a past history of CAD.   The patient exam reveals COR:RRR  ,  Lungs: Clear  ,  Abd: Positive bowel sounds, no rebound no guarding, Ext no edema   .  All available labs, radiology testing, previous records reviewed. Agree with documented assessment and plan. Chest pain:  I think that the pretest probability of her pain being obstructive CAD is relatively low.  OK to discharge with out patient Lexiscan Myoview.  We will arrange.    Fayrene Fearing Gary Gabrielsen  2:16 PM  01/06/2016

## 2016-01-06 NOTE — Discharge Instructions (Signed)
You have a Stress Test scheduled at [place] at the [facility] office on 04/27 at 12:30 pm  No food after midnight before. Can have clear liquids such as water or broth up to 4 hours before. No caffeine/decaf products 24hr before, including meds such as Excedrin or Goody-Powders. Call if there are any questions. OK to take am meds with a sip of water. Arrive about 15 min early for paperwork. Wear comfortable clothes and shoes.  Do NOT take: Beta blockers such as metoprolol/lopressor/Toprol XL or calcium channel blockers such as cardizem/Diltiazem or verapmil/Calan for 24 hours before the test.   Remove nitroglycerin patches and do not take nitrate preparations such as Imdur/isosorbide. No Persantine/Theophylline or Aggrenox meds should be used within 24 hours of the test. Discuss with MD what to do about diabetes meds if you take these.  You will be prepped for the stress portion of the test. With the stress (medical or treadmill), a small amount of radioactive tracer will be injected.  You will get a second set of pictures after a waiting period.   The next day, 04/28 come back to the [facility] office at 12:30 pm, you can have clear liquids that morning. When you arrive in the lab, the technician will inject a small amount of radioactive tracer. After a waiting period, resting pictures will be obtained.   The whole test will take several hours.

## 2016-01-06 NOTE — Progress Notes (Signed)
ANTICOAGULATION CONSULT NOTE - Initial Consult  Pharmacy Consult for Warfarin Indication: atrial fibrillation  Allergies  Allergen Reactions  . Levofloxacin Itching  . Morphine And Related Itching    Patient Measurements: Height: '5\' 3"'$  (160 cm) Weight: 262 lb (118.842 kg) IBW/kg (Calculated) : 52.4   Vital Signs: Temp: 98.2 F (36.8 C) (04/10 2212) Temp Source: Oral (04/10 2212) BP: 188/74 mmHg (04/10 2330) Pulse Rate: 76 (04/10 2330)  Labs:  Recent Labs  01/05/16 2124 01/05/16 2335  HGB 13.7  --   HCT 39.9  --   PLT 237  --   LABPROT  --  29.2*  INR  --  2.94*  CREATININE 1.28*  --     Estimated Creatinine Clearance: 53.9 mL/min (by C-G formula based on Cr of 1.28).   Medical History: Past Medical History  Diagnosis Date  . Hypertension   . Hypercholesteremia   . Peripheral vascular disease ([employer]) 8/12    Lt SFA PTA  . H/O hiatal hernia   . Arthritis   . Non-STEMI (non-ST elevated myocardial infarction) ([place]) 04/07/2012    See cath results below  . Presence of stent in LAD coronary artery 04/07/2012    Xience Expedition DES 2.75 mm x 18 mm (dilated to 3.0 mm)  . Paroxysmal atrial fibrillation ([place]) 04/07/2012    On Warfarin  . Heart palpitations     monitor 04/19/12-05/10/12- frequent PAF with RVR- coumadin started  . Sleep apnea   . Diabetes mellitus     insulin dependent  . GERD (gastroesophageal reflux disease)     tx meds  . Cancer ([place]) 5/10    LUL Vats   . Lung cancer ([place])     2010, surgery 18% left lung no radiation or chemo  . Transfusion history     last april 2015 s/p knee surgery    Medications:   (Not in a hospital admission) Scheduled:  . [START ON 01/07/2016] aspirin EC  81 mg Oral Daily  . cholecalciferol  1,000 Units Oral QHS  . insulin aspart  0-15 Units Subcutaneous TID WC  . insulin glargine  20 Units Subcutaneous QHS  . lisinopril-hydrochlorothiazide  1 tablet Oral Daily  . metoCLOPramide  10 mg Oral BID  . metoprolol  5  mg Intravenous Once  . metoprolol tartrate  25 mg Oral BID  . pantoprazole  40 mg Oral Daily  . simvastatin  40 mg Oral QHS  . sodium chloride flush  3 mL Intravenous Q12H   Infusions:  . sodium chloride      Assessment: 9 yoF with hx of coronary stent c/o chest discomfort.  Pt on chronic warfarin for A-fib.  HD 7.5 mg daily except 3.75 mg M/W/F. INR=2.94 on admission.  Goal of Therapy:  INR 2-3    Plan:   Pharmacy d/c'd Lovenox for DVT prophylaxis, since INR therapeutic  Daily PT/INR  Education  Dorrene German 01/06/2016,1:28 AM

## 2016-01-06 NOTE — Progress Notes (Signed)
[facility] for Warfarin Indication: atrial fibrillation  Allergies  Allergen Reactions  . Levofloxacin Itching  . Morphine And Related Itching   Patient Measurements: Height: '5\' 3"'$  (160 cm) Weight: 256 lb 8 oz (116.348 kg) IBW/kg (Calculated) : 52.4   Vital Signs: Temp: 97.7 F (36.5 C) (04/11 0614) Temp Source: Oral (04/11 0614) BP: 146/45 mmHg (04/11 0614) Pulse Rate: 58 (04/11 0614)  Labs:  Recent Labs  01/05/16 2124 01/05/16 2335 01/06/16 0122 01/06/16 0455 01/06/16 0648  HGB 13.7  --   --   --   --   HCT 39.9  --   --   --   --   PLT 237  --   --   --   --   LABPROT  --  29.2*  --  30.4*  --   INR  --  2.94*  --  3.10*  --   CREATININE 1.28*  --   --   --   --   TROPONINI  --   --  <0.03  --  <0.03   Estimated Creatinine Clearance: 53.2 mL/min (by C-G formula based on Cr of 1.28).  Medical History: Past Medical History  Diagnosis Date  . Hypertension   . Hypercholesteremia   . Peripheral vascular disease ([place]) 8/12    Lt SFA PTA  . H/O hiatal hernia   . Arthritis   . Non-STEMI (non-ST elevated myocardial infarction) ([place]) 04/07/2012    See cath results below  . Presence of stent in LAD coronary artery 04/07/2012    Xience Expedition DES 2.75 mm x 18 mm (dilated to 3.0 mm)  . Paroxysmal atrial fibrillation ([place]) 04/07/2012    On Warfarin  . Heart palpitations     monitor 04/19/12-05/10/12- frequent PAF with RVR- coumadin started  . Sleep apnea   . Diabetes mellitus     insulin dependent  . GERD (gastroesophageal reflux disease)     tx meds  . Cancer ([place]) 5/10    LUL Vats   . Lung cancer ([place])     2010, surgery 18% left lung no radiation or chemo  . Transfusion history     last april 2015 s/p knee surgery   Scheduled:  . [START ON 01/07/2016] aspirin EC  81 mg Oral Daily  . cholecalciferol  1,000 Units Oral QHS  . lisinopril  20 mg Oral Daily   And  . hydrochlorothiazide  25 mg Oral Daily  . insulin aspart   0-15 Units Subcutaneous TID WC  . insulin glargine  20 Units Subcutaneous QHS  . metoCLOPramide  10 mg Oral BID  . metoprolol tartrate  25 mg Oral BID  . pantoprazole  40 mg Oral Daily  . simvastatin  40 mg Oral QHS  . sodium chloride flush  3 mL Intravenous Q12H   Assessment: 67 yoF with hx of coronary stent c/o chest discomfort.  Pt on chronic warfarin for A-fib.  HD 7.5 mg daily except 3.75 mg M/W/F. INR=2.94 on admission.  - Pharmacy d/c'd Lovenox for DVT prophylaxis, since INR therapeutic - CHO modified diet - Admit CBC wnl  Today, 01/06/2016 INR this am increased to 3.10, last dose Warfarin at home 4/10  Goal of Therapy:  INR 2-3   Plan:   Daily PT/INR  No Warfarin today  Minda Ditto PharmD Pager [phone] 01/06/2016, 12:45 PM

## 2016-01-06 NOTE — H&P (Signed)
[facility] History and Physical  BUFFY EHLER AST:[id number] DOB: 27-May-1951 DOA: 01/05/2016  Referring physician: ED physician PCP: [name], MD  Specialists: Dr. Orene Desanctis (cardiology), Dr. Julien Nordmann (oncology)   Chief Complaint:  Chest pain   HPI: [name] is a 65 y.o. female with PMH of CAD with stent, paroxysmal atrial fibrillation on Coumadin, hypertension, insulin-dependent diabetes mellitus, hyperlipidemia, and GERD who presents to the ED with chest pain. Patient reports pain in her usual state until earlier in the night of her presentation when she developed pain in her right chest while seated on her couch watching TV. The pain subsided spontaneously after approximately 10 minutes and later recurred. Again, there was spontaneous resolution but she decided to come into the ED for evaluation. She describes the pain as burning in character, moderate in intensity, onset at rest, resolving spontaneously, and radiating from the right chest through to her back. She reports the symptoms as very similar to those that she has attributed to GERD, but also notes that she thought she was experiencing GERD the last time she had a non-STEMI. There has been no fevers, chills, palpitations, dyspnea, nausea, or diaphoresis. She has not attempted any interventions for her symptoms prior to coming to the ED.  In ED, patient was found to be afebrile, saturating well on room air, and with vital signs stable. EKG features a sinus rhythm with right bundle branch block. Chest x-ray is notable for stable cardiomegaly but negative for acute cardiopulmonary disease. BMP features a serum creatinine of 1.28 which appears stable from prior measurement. CBC is within the normal limits and initial troponin is undetectable. Patient was given a 324 mg aspirin chew in the emergency department, remained hemodynamically stable and chest pain-free, and will be admitted to the hospital for ongoing  evaluation and management of chest pain concerning for possible unstable angina.  Where does patient live?   At home    Can patient participate in ADLs?  Yes      Review of Systems:   General: no fevers, chills, sweats, weight change, poor appetite, or fatigue HEENT: no blurry vision, hearing changes or sore throat Pulm: no dyspnea, cough, or wheeze CV: no palpitations. CP as described in HPI.  Abd: no nausea, vomiting, abdominal pain, diarrhea, or constipation GU: no dysuria, hematuria, increased urinary frequency, or urgency  Ext: no leg edema Neuro: no focal weakness, numbness, or tingling, no vision change or hearing loss Skin: no rash, no wounds MSK: No muscle spasm, no deformity, no red, hot, or swollen joint Heme: No easy bruising or bleeding Travel history: No recent long distant travel    Allergy:  Allergies  Allergen Reactions  . Levofloxacin Itching  . Morphine And Related Itching    Past Medical History  Diagnosis Date  . Hypertension   . Hypercholesteremia   . Peripheral vascular disease ([place]) 8/12    Lt SFA PTA  . H/O hiatal hernia   . Arthritis   . Non-STEMI (non-ST elevated myocardial infarction) ([place]) 04/07/2012    See cath results below  . Presence of stent in LAD coronary artery 04/07/2012    Xience Expedition DES 2.75 mm x 18 mm (dilated to 3.0 mm)  . Paroxysmal atrial fibrillation ([place]) 04/07/2012    On Warfarin  . Heart palpitations     monitor 04/19/12-05/10/12- frequent PAF with RVR- coumadin started  . Sleep apnea   . Diabetes mellitus     insulin dependent  . GERD (gastroesophageal reflux disease)  tx meds  . Cancer ([place]) 5/10    LUL Vats   . Lung cancer ([place])     2010, surgery 18% left lung no radiation or chemo  . Transfusion history     last april 2015 s/p knee surgery    Past Surgical History  Procedure Laterality Date  . Abdominal hysterectomy  1960  . Cholecystectomy    . Lung surgery  5/10  . Percutaneous coronary angioplasty  and stenting  04/07/2012    Xience Expedition DES 2.11m x 18 mm (dilated to 3.0 mm) place to the prox LAD   . Orif ankle fracture  07/09/2012    Procedure: OPEN REDUCTION INTERNAL FIXATION (ORIF) ANKLE FRACTURE;  Surgeon: GMeredith Pel MD;  Location: [facility];  Service: Orthopedics;  Laterality: Left;  open reduction internal fixation trimalleolar ankle fracture medial malleolous fixation  . Cardiac catheterization  11/04/2008    patent RCA, LM, and Circ, nl EF  . Cardiac catheterization  02/20/2002    patent coronaries with the only abnormality being a smooth luminla irregularity in the mid intermediate ramus branch no felt to be hemodynamically significant, nl LV  . Lower extremity angiogram  05/17/11    directional atherectomy to the prox L SFA using a LX Man TurboHawk, ballooned with a FAgilent Technologiesballoon   . Coronary angioplasty with stent placement  04/07/12    LAD  . Total knee revision Left 11/05/2013    Procedure: LEFT TOTAL KNEE RESECTION;  Surgeon: MMauri Pole MD;  Location: [facility];  Service: Orthopedics;  Laterality: Left;  . Joint replacement      left knee 2003  . Foot surgery Bilateral 1993    with screws, on screw removed  . Knee arthroscopy Bilateral     left x2, right x1  . Total knee revision Left 2007    opened in 2006 and cleaned out, 2007 revision  . Total knee revision Left 12/31/2013    Procedure: RE-INPLANTATION LEFT TOTAL KNEE ;  Surgeon: MMauri Pole MD;  Location: [facility];  Service: Orthopedics;  Laterality: Left;  . Cardiac catheterization  05/27/14    DES to proximal LAD  . Left heart catheterization with coronary angiogram N/A 04/07/2012    Procedure: LEFT HEART CATHETERIZATION WITH CORONARY ANGIOGRAM;  Surgeon: JLorretta Harp MD;  [facility];  Service: Cardiovascular;  Laterality: N/A;  . Percutaneous coronary stent intervention (pci-s) N/A 04/07/2012    Procedure: PERCUTANEOUS CORONARY STENT INTERVENTION (PCI-S);  Surgeon: JLorretta Harp MD;   [facility];  Service: Cardiovascular;  Laterality: N/A;  . Left heart catheterization with coronary angiogram N/A 05/27/2014    Procedure: LEFT HEART CATHETERIZATION WITH CORONARY ANGIOGRAM;  Surgeon: JLorretta Harp MD;  [facility];  Service: Cardiovascular;  Laterality: N/A;  . Percutaneous coronary stent intervention (pci-s)  05/27/2014    Procedure: PERCUTANEOUS CORONARY STENT INTERVENTION (PCI-S);  Surgeon: JLorretta Harp MD;  [facility];  Service: Cardiovascular;;  prox lad  . Esophagogastroduodenoscopy N/A 05/23/2015    Procedure: ESOPHAGOGASTRODUODENOSCOPY (EGD);  Surgeon: PCarol Ada MD;  [facility];  Service: Endoscopy;  Laterality: N/A;  . Tonsillectomy    . Eye surgery      catraactes 2013  . Esophagogastroduodenoscopy (egd) with propofol N/A 06/13/2015    Procedure: ESOPHAGOGASTRODUODENOSCOPY (EGD) WITH PROPOFOL;  Surgeon: PCarol Ada MD;  [facility];  Service: Endoscopy;  Laterality: N/A;  . Savory dilation N/A 06/13/2015    Procedure:  SAVORY DILATION;  Surgeon: Carol Ada, MD;  [facility];  Service: Endoscopy;  Laterality: N/A;    Social History:  reports that she quit smoking about 31 years ago. Her smoking use included Cigarettes. She has a 20 pack-year smoking history. She has never used smokeless tobacco. She reports that she does not drink alcohol or use illicit drugs.  Family History:  Family History  Problem Relation Age of Onset  . Hypertension Mother   . Coronary artery disease Brother   . Hypertension Sister      Prior to Admission medications   Medication Sig Start Date End Date Taking? Authorizing Provider  cholecalciferol (VITAMIN D) 1000 UNITS tablet Take 1,000 Units by mouth at bedtime.    Yes Historical Provider, MD  glimepiride (AMARYL) 2 MG tablet Take 1 tablet by mouth daily.  12/23/15  Yes Historical Provider, MD  insulin NPH-regular Human (NOVOLIN 70/30) (70-30) 100 UNIT/ML  injection Inject 28-32 Units into the skin 2 (two) times daily with a meal. Take 32 units in the morning 28 units in the evening   Yes Historical Provider, MD  lisinopril-hydrochlorothiazide (PRINZIDE,ZESTORETIC) 20-25 MG per tablet Take 1 tablet by mouth daily.   Yes Historical Provider, MD  metoCLOPramide (REGLAN) 10 MG tablet Take 10 mg by mouth 2 (two) times daily. 5 mg in the morning then '10mg'$  in at bedtime   Yes Historical Provider, MD  metoprolol tartrate (LOPRESSOR) 25 MG tablet TAKE 1 TABLET BY MOUTH TWICE A DAY 06/25/15  Yes Mihai Croitoru, MD  pantoprazole (PROTONIX) 40 MG tablet TAKE 1 TABLET BY MOUTH EVERY DAY 05/12/15  Yes Mihai Croitoru, MD  simvastatin (ZOCOR) 40 MG tablet TAKE 1 TABLET BY MOUTH AT BEDTIME 04/24/15  Yes Mihai Croitoru, MD  warfarin (COUMADIN) 7.5 MG tablet Take 1 tablet by mouth daily or as directed by coumadin clinic Patient taking differently: Take 3.75-7.5 mg by mouth daily. Takes 1 tablet (7.5 mg total) by mouth daily except for Mondays Wednesday and Fridays she takes 0.5 tablet (3.75 mg total) 12/29/14  Yes Sanda Klein, MD    Physical Exam: Filed Vitals:   01/05/16 2212 01/05/16 2230 01/05/16 2300 01/05/16 2330  BP: 192/69 176/63 172/68 188/74  Pulse: 63 58 52 76  Temp: 98.2 F (36.8 C)     TempSrc: Oral     Resp: '15 16 18 29  '$ Height:      Weight:      SpO2: 100% 100% 99% 100%   General: Not in acute distress HEENT:       Eyes: PERRL, EOMI, no scleral icterus or conjunctival pallor.       ENT: No discharge from the ears or nose, no pharyngeal ulcers.        Neck: No JVD, no bruit, no appreciable mass Heme: No cervical adenopathy, no pallor Cardiac: S1/S2, RRR, No murmurs, No gallops or rubs. Pulm: Good air movement bilaterally. No rales, wheezing, rhonchi or rubs. Abd: Soft, nondistended, nontender, no rebound pain or gaurding, BS present. Ext: No LE edema bilaterally. 2+DP/PT pulse bilaterally. Musculoskeletal: No gross deformity, no red, hot,  swollen joints   Skin: No rashes or wounds on exposed surfaces  Neuro: Alert, oriented X3, cranial nerves II-XII grossly intact. No focal findings Psych: Patient is not overtly psychotic, appropriate mood and affect.  Labs on Admission:  Basic Metabolic Panel:  Recent Labs Lab 01/05/16 2124  NA 138  K 4.0  CL 99*  CO2 29  GLUCOSE 182*  BUN 15  CREATININE  1.28*  CALCIUM 10.2   Liver Function Tests: No results for input(s): AST, ALT, ALKPHOS, BILITOT, PROT, ALBUMIN in the last 168 hours. No results for input(s): LIPASE, AMYLASE in the last 168 hours. No results for input(s): AMMONIA in the last 168 hours. CBC:  Recent Labs Lab 01/05/16 2124  WBC 9.3  HGB 13.7  HCT 39.9  MCV 83.6  PLT 237   Cardiac Enzymes: No results for input(s): CKTOTAL, CKMB, CKMBINDEX, TROPONINI in the last 168 hours.  BNP (last 3 results) No results for input(s): BNP in the last 8760 hours.  ProBNP (last 3 results) No results for input(s): PROBNP in the last 8760 hours.  CBG: No results for input(s): GLUCAP in the last 168 hours.  Radiological Exams on Admission: Dg Chest 2 View  01/05/2016  CLINICAL DATA:  Chest pain radiating to the back, onset tonight. EXAM: CHEST  2 VIEW COMPARISON:  01/01/2014 FINDINGS: There is unchanged moderate cardiomegaly. The lungs are clear. There is no pleural effusion. Hilar and mediastinal contours are unremarkable and unchanged. IMPRESSION: Stable cardiomegaly.  No acute cardiopulmonary findings. Electronically Signed   By: Andreas Newport M.D.   On: 01/05/2016 21:25    EKG: Independently reviewed.  Abnormal findings:  Sinus rhythm, RBBB   Assessment/Plan  1. Chest pain  - No pain since her arrival  - Initial troponin 0.00, initial EKG without acute ischemic changes  - ASA 324 mg given in ED; given Lopressor 5 mg IVP and Zocor 40 mg at time of admission  - Monitor on telemetry with serial troponin measurements overnight; repeat EKG in am  - Further  management will be dependent on overnight events    2. CAD - DES placed to LAD in 2013; critical in-stent stenosis noted in July 2015 and tx with angioplasty and additional DES   - Has been free of CP since the procedure in July 2015  - Continue home-dose Lopressor BID, Zocor qHS, lisinopril  - Control DM and HTN as below    3. Type II DM  - Managed with Novolin 70/30 (32 units qAM, 28 units qPM), and glimepiride at home  - A1c was 8.7% in February 2015, reflecting poor control at that time  - Hold home medications while admitted  - Check CBG with meals and qHS - Start with reduced-dose basal coverage, and moderate-intensity SSI correctional, adjust prn  - Update A1c, pending  - Carb-modified diet when appropriate   4. Hypertension - BP is above goal currently  - Given '5mg'$  IVP Lopressor now in setting of potential unstable angina  - Continue home-dose lisinopril-HCTZ, metoprolol BID    5. Hyperlipidemia  - LDL 72, HDL 46 in October 2015  - Continue home-dose Zocor 40 mg qHS - Update fasting lipid panel in am   6. Paroxysmal atrial fibrillation  - Currently in sinus rhythm  - INR is therapeutic at 2.94 on admission  - CHADS-VASc 5 (age, gender, HTN, DM, CAD/PAD)  - Continue AC with warfarin - Continue home-dose Lopressor BID  - Monitor on telemetry    DVT ppx: SQ Lovenox     Code Status: Full code Family Communication:  Yes, patient's husband and children at bed side Disposition Plan: Admit to inpatient   Date of Service 01/06/2016    Vianne Bulls, MD [facility] Pager [phone]  If 7PM-7AM, please contact night-coverage [email or web address] Password [email or web address] 01/06/2016, 1:06 AM

## 2016-01-07 LAB — HEMOGLOBIN A1C
Hgb A1c MFr Bld: 9.2 % — ABNORMAL HIGH (ref 4.8–5.6)
MEAN PLASMA GLUCOSE: 217 mg/dL

## 2016-01-15 DIAGNOSIS — E1065 Type 1 diabetes mellitus with hyperglycemia: Secondary | ICD-10-CM | POA: Diagnosis not present

## 2016-01-20 ENCOUNTER — Telehealth (HOSPITAL_COMMUNITY): Payer: Self-pay

## 2016-01-20 DIAGNOSIS — Z780 Asymptomatic menopausal state: Secondary | ICD-10-CM | POA: Diagnosis not present

## 2016-01-20 DIAGNOSIS — E1065 Type 1 diabetes mellitus with hyperglycemia: Secondary | ICD-10-CM | POA: Diagnosis not present

## 2016-01-20 DIAGNOSIS — I10 Essential (primary) hypertension: Secondary | ICD-10-CM | POA: Diagnosis not present

## 2016-01-20 DIAGNOSIS — E782 Mixed hyperlipidemia: Secondary | ICD-10-CM | POA: Diagnosis not present

## 2016-01-20 NOTE — Telephone Encounter (Signed)
Encounter complete. 

## 2016-01-22 ENCOUNTER — Ambulatory Visit (HOSPITAL_COMMUNITY)
Admission: RE | Admit: 2016-01-22 | Discharge: 2016-01-22 | Disposition: A | Discharge status: Home / Self Care | Payer: Commercial Managed Care - HMO | Source: Ambulatory Visit | Attending: Physician Assistant | Admitting: Physician Assistant

## 2016-01-22 ENCOUNTER — Ambulatory Visit (INDEPENDENT_AMBULATORY_CARE_PROVIDER_SITE_OTHER): Payer: Commercial Managed Care - HMO | Admitting: Pharmacist

## 2016-01-22 DIAGNOSIS — I739 Peripheral vascular disease, unspecified: Secondary | ICD-10-CM | POA: Insufficient documentation

## 2016-01-22 DIAGNOSIS — Z7901 Long term (current) use of anticoagulants: Secondary | ICD-10-CM

## 2016-01-22 DIAGNOSIS — R079 Chest pain, unspecified: Secondary | ICD-10-CM | POA: Diagnosis not present

## 2016-01-22 DIAGNOSIS — K219 Gastro-esophageal reflux disease without esophagitis: Secondary | ICD-10-CM | POA: Insufficient documentation

## 2016-01-22 DIAGNOSIS — Z8249 Family history of ischemic heart disease and other diseases of the circulatory system: Secondary | ICD-10-CM | POA: Insufficient documentation

## 2016-01-22 DIAGNOSIS — E119 Type 2 diabetes mellitus without complications: Secondary | ICD-10-CM | POA: Diagnosis not present

## 2016-01-22 DIAGNOSIS — Z87891 Personal history of nicotine dependence: Secondary | ICD-10-CM | POA: Diagnosis not present

## 2016-01-22 DIAGNOSIS — I10 Essential (primary) hypertension: Secondary | ICD-10-CM | POA: Insufficient documentation

## 2016-01-22 DIAGNOSIS — Z6841 Body Mass Index (BMI) 40.0 and over, adult: Secondary | ICD-10-CM | POA: Diagnosis not present

## 2016-01-22 DIAGNOSIS — R9439 Abnormal result of other cardiovascular function study: Secondary | ICD-10-CM | POA: Insufficient documentation

## 2016-01-22 DIAGNOSIS — E669 Obesity, unspecified: Secondary | ICD-10-CM | POA: Insufficient documentation

## 2016-01-22 DIAGNOSIS — I480 Paroxysmal atrial fibrillation: Secondary | ICD-10-CM | POA: Diagnosis not present

## 2016-01-22 LAB — POCT INR: INR: 2.7

## 2016-01-22 MED ORDER — AMINOPHYLLINE 25 MG/ML IV SOLN
100.0000 mg | Freq: Once | INTRAVENOUS | Status: AC
  Administered 2016-01-22: 100 mg via INTRAVENOUS

## 2016-01-22 MED ORDER — REGADENOSON 0.4 MG/5ML IV SOLN
0.4000 mg | Freq: Once | INTRAVENOUS | Status: AC
Start: 2016-01-22 — End: 2016-01-22
  Administered 2016-01-22: 0.4 mg via INTRAVENOUS

## 2016-01-22 MED ORDER — TECHNETIUM TC 99M SESTAMIBI GENERIC - CARDIOLITE
30.3000 | Freq: Once | INTRAVENOUS | Status: AC | PRN
  Administered 2016-01-22: 30.3 via INTRAVENOUS

## 2016-01-23 ENCOUNTER — Ambulatory Visit (HOSPITAL_COMMUNITY)
Admit: 2016-01-23 | Discharge: 2016-01-23 | Disposition: A | Discharge status: Home / Self Care | Payer: Commercial Managed Care - HMO | Attending: Urology | Admitting: Urology

## 2016-01-23 LAB — MYOCARDIAL PERFUSION IMAGING
CHL CUP NUCLEAR SDS: 5
CHL CUP NUCLEAR SRS: 17
CHL CUP NUCLEAR SSS: 20
CHL CUP RESTING HR STRESS: 59 {beats}/min
LV sys vol: 30 mL
LVDIAVOL: 76 mL (ref 46–106)
Peak HR: 97 {beats}/min
TID: 1.28

## 2016-01-23 MED ORDER — TECHNETIUM TC 99M SESTAMIBI GENERIC - CARDIOLITE
30.1000 | Freq: Once | INTRAVENOUS | Status: AC | PRN
  Administered 2016-01-23: 30.1 via INTRAVENOUS

## 2016-01-25 ENCOUNTER — Other Ambulatory Visit: Payer: Self-pay | Admitting: Cardiovascular Disease

## 2016-01-26 NOTE — Progress Notes (Signed)
Yes, I will talk to her on the 5th. Thanks (I read the study, btw) MCr

## 2016-01-27 ENCOUNTER — Other Ambulatory Visit: Payer: Self-pay | Admitting: Cardiovascular Disease

## 2016-01-27 NOTE — Telephone Encounter (Signed)
REFILL 

## 2016-01-28 NOTE — Telephone Encounter (Signed)
Rx Refill

## 2016-01-30 ENCOUNTER — Encounter: Payer: Self-pay | Admitting: *Deleted

## 2016-01-30 ENCOUNTER — Encounter: Payer: Self-pay | Admitting: Cardiovascular Disease

## 2016-01-30 ENCOUNTER — Ambulatory Visit (INDEPENDENT_AMBULATORY_CARE_PROVIDER_SITE_OTHER): Payer: Commercial Managed Care - HMO | Admitting: Cardiovascular Disease

## 2016-01-30 VITALS — BP 145/62 | HR 62 | Ht 63.0 in | Wt 260.0 lb

## 2016-01-30 DIAGNOSIS — I739 Peripheral vascular disease, unspecified: Secondary | ICD-10-CM

## 2016-01-30 DIAGNOSIS — I10 Essential (primary) hypertension: Secondary | ICD-10-CM

## 2016-01-30 DIAGNOSIS — R931 Abnormal findings on diagnostic imaging of heart and coronary circulation: Secondary | ICD-10-CM

## 2016-01-30 DIAGNOSIS — I2510 Atherosclerotic heart disease of native coronary artery without angina pectoris: Secondary | ICD-10-CM

## 2016-01-30 DIAGNOSIS — E1151 Type 2 diabetes mellitus with diabetic peripheral angiopathy without gangrene: Secondary | ICD-10-CM

## 2016-01-30 DIAGNOSIS — R5383 Other fatigue: Secondary | ICD-10-CM

## 2016-01-30 DIAGNOSIS — I5032 Chronic diastolic (congestive) heart failure: Secondary | ICD-10-CM

## 2016-01-30 DIAGNOSIS — R9439 Abnormal result of other cardiovascular function study: Secondary | ICD-10-CM | POA: Diagnosis not present

## 2016-01-30 DIAGNOSIS — I480 Paroxysmal atrial fibrillation: Secondary | ICD-10-CM

## 2016-01-30 DIAGNOSIS — D689 Coagulation defect, unspecified: Secondary | ICD-10-CM

## 2016-01-30 DIAGNOSIS — Z01812 Encounter for preprocedural laboratory examination: Secondary | ICD-10-CM

## 2016-01-30 DIAGNOSIS — E785 Hyperlipidemia, unspecified: Secondary | ICD-10-CM

## 2016-01-30 LAB — CBC
HEMATOCRIT: 38.8 % (ref 35.0–45.0)
Hemoglobin: 13 g/dL (ref 11.7–15.5)
MCH: 29.1 pg (ref 27.0–33.0)
MCHC: 33.5 g/dL (ref 32.0–36.0)
MCV: 87 fL (ref 80.0–100.0)
MPV: 10.9 fL (ref 7.5–12.5)
Platelets: 260 10*3/uL (ref 140–400)
RBC: 4.46 MIL/uL (ref 3.80–5.10)
RDW: 13.5 % (ref 11.0–15.0)
WBC: 8 10*3/uL (ref 3.8–10.8)

## 2016-01-30 LAB — APTT: aPTT: 40 seconds — ABNORMAL HIGH (ref 24–37)

## 2016-01-30 LAB — PROTIME-INR
INR: 2.04 — ABNORMAL HIGH (ref <1.50)
Prothrombin Time: 23.3 seconds — ABNORMAL HIGH (ref 11.6–15.2)

## 2016-01-30 LAB — BASIC METABOLIC PANEL
BUN: 10 mg/dL (ref 7–25)
CALCIUM: 9.8 mg/dL (ref 8.6–10.4)
CHLORIDE: 98 mmol/L (ref 98–110)
CO2: 25 mmol/L (ref 20–31)
CREATININE: 1.03 mg/dL — AB (ref 0.50–0.99)
GLUCOSE: 275 mg/dL — AB (ref 65–99)
Potassium: 4.1 mmol/L (ref 3.5–5.3)
Sodium: 136 mmol/L (ref 135–146)

## 2016-01-30 NOTE — Patient Instructions (Signed)
Your physician has requested that you have a cardiac catheterization with Dr Glenetta Hew on Thursday, May 11th. Cardiac catheterization is used to diagnose and/or treat various heart conditions. Doctors may recommend this procedure for a number of different reasons. The most common reason is to evaluate chest pain. Chest pain can be a symptom of coronary artery disease (CAD), and cardiac catheterization can show whether plaque is narrowing or blocking your heart's arteries. This procedure is also used to evaluate the valves, as well as measure the blood flow and oxygen levels in different parts of your heart. For further information please visit [email or web address].   Following your catheterization, you will not be allowed to drive for 3 days. No lifting, pushing, or pulling greater that 10 pounds is allowed for 1 week.  You will be required to have the following tests prior to the procedure:  1. Blood work-the blood work can be done no more than 7 days prior to the procedure. It can be done at any [facility] lab. There is one downstairs on the first floor of this building and one in the [facility] building [phone]).  2. Chest Xray-the chest xray order has already been placed at the [facility].   MEDICATION INSTRUCTIONS: 1. Take Warfarin tomorrow, then HOLD 2. DO NOT TAKE Lisinipril-HCT the DAY OF THE PROCEDURE 3. The morning of the procedure, take your diabetes medications at half dose

## 2016-01-30 NOTE — Progress Notes (Signed)
Patient ID: [name], female   DOB: 09-05-1951, 65 y.o.   MRN: [id number]  Patient ID: [name], female   DOB: September 28, 1950, 65 y.o.   MRN: [id number]    Cardiology Office Note    Date:  01/30/2016   ID:  [name], DOB 1950-11-27, MRN [id number]  PCP:  [name], MD  Cardiologist:   Sanda Klein, MD   Chief Complaint  Patient presents with  . Follow-up    follow up from a stress test    History of Present Illness:  [name] is a 65 y.o. female with CAD and PAD, ischemic cardiomyopathy and paroxysmal atrial fibrillation.  After her March 29 office visit, she went to the emergency room with complaints of chest pain on April 10. She was briefly hospitalized. Her chest discomfort was quite different from her previous angina and her cardiac enzymes are normal. She was scheduled for outpatient nuclear study which was performed on April 27. The images are rather poor quality, but nevertheless it is a very abnormal test. The study suggests severe resting ischemia (possibly a component of infarction) in the entire anteroapical distribution. The images actually look remarkably similar to the study she had done before her last LAD percutaneous revascularization in 2015. She has not had any recurrence of the chest discomfort.  Labs checked very recently with Dr. Ashby Dawes. Told her lipid profile was very good, unfortunately hemoglobin A1c was elevated at 8.9%. Today INR 2.8. No bleeding problems on chronic warfarin anticoagulation.  In 2013 she received a drug-eluting stent to the LAD artery. In July 2015 she had high-grade in-stent restenosis treated with balloon angioplasty and placement of a new 312 mm drug-eluting Xience expedition stent to the distal edge of the previous stent. Incidental note made of a 50-60 percent mid AV groove left circumflex stenosis She has been angina free since. She has normal left ventricular systolic function and no congestive  heart failure.  In addition she has history of PAD status post atherectomy of the left superficial femoral artery and paroxysmal atrial fibrillation (on chronic warfarin anticoagulation). Additional comorbid conditions include hypertension, obesity, type 2 diabetes mellitus and gastroesophageal reflux disease. She has a history of left upper lobe lobectomy for adenocarcinoma of the lung with curative intent February 2010. She has a history of inflammatory esophagitis (noneosinophilic), stricture (which was dilated) and cervical web. (Dr. Benson Norway, EGD 2016)  Labs checked very recently with Dr. Ashby Dawes. Told her lipid profile was very good, unfortunately hemoglobin A1c was elevated at 8.9%. Today INR 2.8. No bleeding problems on chronic warfarin anticoagulation.  Past Medical History  Diagnosis Date  . Hypertension   . Hypercholesteremia   . Peripheral vascular disease ([place]) 8/12    Lt SFA PTA  . H/O hiatal hernia   . Arthritis   . Non-STEMI (non-ST elevated myocardial infarction) ([place]) 04/07/2012    See cath results below  . Presence of stent in LAD coronary artery 04/07/2012    Xience Expedition DES 2.75 mm x 18 mm (dilated to 3.0 mm)  . Paroxysmal atrial fibrillation ([place]) 04/07/2012    On Warfarin  . Heart palpitations     monitor 04/19/12-05/10/12- frequent PAF with RVR- coumadin started  . Sleep apnea   . Diabetes mellitus     insulin dependent  . GERD (gastroesophageal reflux disease)     tx meds  . Cancer ([place]) 5/10    [facility]   . Lung cancer ([place])     2010, surgery 18%  left lung no radiation or chemo  . Transfusion history     last april 2015 s/p knee surgery    Past Surgical History  Procedure Laterality Date  . Abdominal hysterectomy  1960  . Cholecystectomy    . Lung surgery  5/10  . Percutaneous coronary angioplasty and stenting  04/07/2012    Xience Expedition DES 2.48m x 18 mm (dilated to 3.0 mm) to the prox LAD   . Orif ankle fracture  07/09/2012    Procedure:  OPEN REDUCTION INTERNAL FIXATION (ORIF) ANKLE FRACTURE;  Surgeon: GMeredith Pel MD;  Location: [facility];  Service: Orthopedics;  Laterality: Left;  open reduction internal fixation trimalleolar ankle fracture medial malleolous fixation  . Cardiac catheterization  11/04/2008    patent RCA, LM, and Circ, nl EF  . Cardiac catheterization  02/20/2002    patent coronaries with the only abnormality being a smooth luminla irregularity in the mid intermediate ramus branch no felt to be hemodynamically significant, nl LV  . Lower extremity angiogram  05/17/11    directional atherectomy to the prox L SFA using a LX Man TurboHawk, ballooned with a FAgilent Technologiesballoon   . Total knee revision Left 11/05/2013    Procedure: LEFT TOTAL KNEE RESECTION;  Surgeon: MMauri Pole MD;  Location: [facility];  Service: Orthopedics;  Laterality: Left;  . Joint replacement Left 2003    Knee  . Foot surgery Bilateral 1993    with screws, on screw removed  . Knee arthroscopy Bilateral     left x2, right x1  . Total knee revision Left 2007    opened in 2006 and cleaned out, 2007 revision  . Total knee revision Left 12/31/2013    Procedure: RE-INPLANTATION LEFT TOTAL KNEE ;  Surgeon: MMauri Pole MD;  Location: [facility];  Service: Orthopedics;  Laterality: Left;  . Left heart catheterization with coronary angiogram N/A 04/07/2012    Procedure: LEFT HEART CATHETERIZATION WITH CORONARY ANGIOGRAM;  Surgeon: JLorretta Harp MD;  [facility];  Service: Cardiovascular;  Laterality: N/A;  . Percutaneous coronary stent intervention (pci-s) N/A 04/07/2012    Procedure: PERCUTANEOUS CORONARY STENT INTERVENTION (PCI-S);  Surgeon: JLorretta Harp MD;  [facility];  Service: Cardiovascular;  Laterality: N/A;  . Left heart catheterization with coronary angiogram N/A 05/27/2014    Procedure: LEFT HEART CATHETERIZATION WITH CORONARY ANGIOGRAM;  Surgeon: JLorretta Harp MD; LAD 99% ISR, CFX 50-60%, RCA (dominant) no sig  dz, EF 60%  . Percutaneous coronary stent intervention (pci-s)  05/27/2014    Procedure: PERCUTANEOUS CORONARY STENT INTERVENTION (PCI-S);  Surgeon: JLorretta Harp MD; 3 mm x 12 mm long Xience Xpedition DES to the proximal LAD  . Esophagogastroduodenoscopy N/A 05/23/2015    Procedure: ESOPHAGOGASTRODUODENOSCOPY (EGD);  Surgeon: PCarol Ada MD;  [facility];  Service: Endoscopy;  Laterality: N/A;  . Tonsillectomy    . Eye surgery      catraactes 2013  . Esophagogastroduodenoscopy (egd) with propofol N/A 06/13/2015    Procedure: ESOPHAGOGASTRODUODENOSCOPY (EGD) WITH PROPOFOL;  Surgeon: PCarol Ada MD;  [facility];  Service: Endoscopy;  Laterality: N/A;  . Savory dilation N/A 06/13/2015    Procedure: SAVORY DILATION;  Surgeon: PCarol Ada MD;  Location: [facility];  Service: Endoscopy;  Laterality: N/A;    Current Medications: Outpatient Prescriptions Prior to Visit  Medication Sig Dispense Refill  . aspirin EC 81 MG EC tablet Take 1 tablet (81 mg total) by mouth daily.    .Marland Kitchen  cholecalciferol (VITAMIN D) 1000 UNITS tablet Take 1,000 Units by mouth at bedtime.     Marland Kitchen glimepiride (AMARYL) 2 MG tablet Take 1 tablet by mouth daily.     . insulin NPH-regular Human (NOVOLIN 70/30) (70-30) 100 UNIT/ML injection Inject 28-32 Units into the skin 2 (two) times daily with a meal. Take 32 units in the morning 28 units in the evening    . lisinopril-hydrochlorothiazide (PRINZIDE,ZESTORETIC) 20-25 MG per tablet Take 1 tablet by mouth daily.    . metoCLOPramide (REGLAN) 10 MG tablet Take 10 mg by mouth 2 (two) times daily. 5 mg in the morning then '10mg'$  in at bedtime    . metoprolol tartrate (LOPRESSOR) 25 MG tablet TAKE 1 TABLET BY MOUTH TWICE A DAY 60 tablet 7  . pantoprazole (PROTONIX) 40 MG tablet TAKE 1 TABLET BY MOUTH EVERY DAY 30 tablet 8  . simvastatin (ZOCOR) 40 MG tablet TAKE 1 TABLET BY MOUTH AT BEDTIME 30 tablet 2  . warfarin (COUMADIN) 7.5 MG tablet TAKE 1 TABLET BY  MOUTH EVERY DAY OR AS DIRECTED BY COUMADIN CLINIC 90 tablet 1  . metoprolol tartrate (LOPRESSOR) 25 MG tablet TAKE 1 TABLET BY MOUTH TWICE A DAY (Patient not taking: Reported on 01/30/2016) 60 tablet 1  . warfarin (COUMADIN) 7.5 MG tablet Take 1 tablet by mouth daily or as directed by coumadin clinic (Patient not taking: Reported on 01/30/2016) 30 tablet 3   No facility-administered medications prior to visit.     Allergies:   Levofloxacin and Morphine and related   Social History   Social History  . Marital Status: Single    Spouse Name: N/A  . Number of Children: N/A  . Years of Education: N/A   Occupational History  . Retired    Social History Main Topics  . Smoking status: Former Smoker -- 1.00 packs/day for 20 years    Types: Cigarettes    Quit date: 09/27/1984  . Smokeless tobacco: Never Used  . Alcohol Use: No  . Drug Use: No  . Sexual Activity: Not Asked   Other Topics Concern  . None   Social History Narrative   Patient lives alone.     Family History:  The patient's family history includes Coronary artery disease in her brother; Hypertension in her mother and sister.   ROS:   Please see the history of present illness.    ROS All other systems reviewed and are negative.   PHYSICAL EXAM:   VS:  BP 145/62 mmHg  Pulse 62  Ht '5\' 3"'$  (1.6 m)  Wt 117.935 kg (260 lb)  BMI 46.07 kg/m2   GEN: Well nourished, well developed, in no acute distress HEENT: normal Neck: no JVD, carotid bruits, or masses Cardiac: RRR; no murmurs, rubs, or gallops,no edema  Respiratory:  clear to auscultation bilaterally, normal work of breathing GI: soft, nontender, nondistended, + BS MS: no deformity or atrophy Skin: warm and dry, no rash Neuro:  Alert and Oriented x 3, Strength and sensation are intact Psych: euthymic mood, full affect  Wt Readings from Last 3 Encounters:  01/30/16 117.935 kg (260 lb)  01/22/16 118.842 kg (262 lb)  01/06/16 116.348 kg (256 lb 8 oz)       Studies/Labs Reviewed:   EKG:  EKG is ordered today.  The ekg ordered today demonstrates NSR, RBBB, leftward axis  Recent Labs: 09/15/2015: ALT 13 01/05/2016: BUN 15; Creatinine, Ser 1.28*; Hemoglobin 13.7; Platelets 237; Potassium 4.0; Sodium 138   Lipid Panel  Component Value Date/Time   CHOL 119 01/06/2016 0455   TRIG 90 01/06/2016 0455   HDL 43 01/06/2016 0455   CHOLHDL 2.8 01/06/2016 0455   VLDL 18 01/06/2016 0455   LDLCALC 58 01/06/2016 0455   LDLDIRECT 143* 10/10/2008 2042    ASSESSMENT:    1. Abnormal nuclear cardiac imaging test   2. Coronary artery disease involving native coronary artery of native heart without angina pectoris   3. Pre-procedural laboratory examination   4. Blood clotting disorder (HCC)   5. Other fatigue   6. Chronic diastolic heart failure, NYHA class 2 ([place])   7. PVD (peripheral vascular disease), Lt SFA PTA Aug 2012   8. Essential hypertension   9. DM (diabetes mellitus), type 2 with peripheral vascular complications ([place])   10. Dyslipidemia   11. Morbid obesity due to excess calories ([place])   12. Paroxysmal atrial fibrillation      PLAN:  In order of problems listed above:  CAD: asymptomatic now, but with a markedly abnormal nuclear stress test. I think she should undergo coronary angiography, there is a very high likelihood that she has again severe LAD in-stent restenosis, possibly even a vessel occlusion. Since she is on warfarin, will delay the procedure until next week. She does not have a history of stroke and will not probably require "bridging". She should hold her lisinopril-hydrochlorothiazide on the day of the procedure.CHF: Appears euvolemic. Limited by knee problems, not dyspnea.   1. CHF: Appears euvolemic. Limited by knee problems, not dyspnea. 2. PAD: no claudication at her level of activity 3. HTN: well controlled 4. DM, poorly controlled:  A1c 8.9% 5. HLP:  She reports a good lipid profile, will get the  results 6. Obesity: discussed calorie restriction, avoiding carbs with high glycemic index. 7. PAF: no clinical events; on warfarin without bleeding complications, INR 2.8 today. CHADSVasc 6 (age, gender, DM, CHF, CAD/PAD, HTN).    Medication Adjustments/Labs and Tests Ordered: Current medicines are reviewed at length with the patient today.  Concerns regarding medicines are outlined above.  Medication changes, Labs and Tests ordered today are listed in the Patient Instructions below. Patient Instructions  Your physician has requested that you have a cardiac catheterization with Dr Glenetta Hew on Thursday, May 11th. Cardiac catheterization is used to diagnose and/or treat various heart conditions. Doctors may recommend this procedure for a number of different reasons. The most common reason is to evaluate chest pain. Chest pain can be a symptom of coronary artery disease (CAD), and cardiac catheterization can show whether plaque is narrowing or blocking your heart's arteries. This procedure is also used to evaluate the valves, as well as measure the blood flow and oxygen levels in different parts of your heart. For further information please visit [email or web address].   Following your catheterization, you will not be allowed to drive for 3 days. No lifting, pushing, or pulling greater that 10 pounds is allowed for 1 week.  You will be required to have the following tests prior to the procedure:  1. Blood work-the blood work can be done no more than 7 days prior to the procedure. It can be done at any [facility] lab. There is one downstairs on the first floor of this building and one in the [facility] building [phone]).  2. Chest Xray-the chest xray order has already been placed at the [facility].   MEDICATION INSTRUCTIONS: 1. Take Warfarin tomorrow, then HOLD 2. DO NOT TAKE  Lisinipril-HCT the DAY OF THE PROCEDURE 3. The morning of the  procedure, take your diabetes medications at half dose     Mikael Spray, MD  01/30/2016 12:58 PM    [facility] [address], [place], [place]  [address] Phone: [phone]; Fax: [phone]

## 2016-01-31 LAB — TSH: TSH: 1.79 mIU/L

## 2016-02-04 ENCOUNTER — Other Ambulatory Visit: Payer: Self-pay | Admitting: Cardiology

## 2016-02-04 DIAGNOSIS — R9439 Abnormal result of other cardiovascular function study: Secondary | ICD-10-CM

## 2016-02-05 ENCOUNTER — Encounter (HOSPITAL_COMMUNITY)
Admission: RE | Disposition: A | Discharge status: Home / Self Care | Payer: Self-pay | Source: Ambulatory Visit | Attending: Cardiology

## 2016-02-05 ENCOUNTER — Ambulatory Visit (HOSPITAL_COMMUNITY)
Admission: RE | Admit: 2016-02-05 | Discharge: 2016-02-06 | Disposition: A | Discharge status: Home / Self Care | Payer: Commercial Managed Care - HMO | Source: Ambulatory Visit | Attending: Cardiology | Admitting: Cardiology

## 2016-02-05 ENCOUNTER — Encounter (HOSPITAL_COMMUNITY): Payer: Self-pay | Admitting: Cardiology

## 2016-02-05 DIAGNOSIS — E785 Hyperlipidemia, unspecified: Secondary | ICD-10-CM | POA: Diagnosis not present

## 2016-02-05 DIAGNOSIS — I252 Old myocardial infarction: Secondary | ICD-10-CM | POA: Diagnosis not present

## 2016-02-05 DIAGNOSIS — Z6841 Body Mass Index (BMI) 40.0 and over, adult: Secondary | ICD-10-CM | POA: Insufficient documentation

## 2016-02-05 DIAGNOSIS — E1151 Type 2 diabetes mellitus with diabetic peripheral angiopathy without gangrene: Secondary | ICD-10-CM | POA: Diagnosis not present

## 2016-02-05 DIAGNOSIS — Z7902 Long term (current) use of antithrombotics/antiplatelets: Secondary | ICD-10-CM | POA: Diagnosis not present

## 2016-02-05 DIAGNOSIS — Z85118 Personal history of other malignant neoplasm of bronchus and lung: Secondary | ICD-10-CM | POA: Insufficient documentation

## 2016-02-05 DIAGNOSIS — Z79899 Other long term (current) drug therapy: Secondary | ICD-10-CM | POA: Diagnosis not present

## 2016-02-05 DIAGNOSIS — Z7982 Long term (current) use of aspirin: Secondary | ICD-10-CM | POA: Diagnosis not present

## 2016-02-05 DIAGNOSIS — I255 Ischemic cardiomyopathy: Secondary | ICD-10-CM | POA: Diagnosis not present

## 2016-02-05 DIAGNOSIS — Y838 Other surgical procedures as the cause of abnormal reaction of the patient, or of later complication, without mention of misadventure at the time of the procedure: Secondary | ICD-10-CM | POA: Insufficient documentation

## 2016-02-05 DIAGNOSIS — Z955 Presence of coronary angioplasty implant and graft: Secondary | ICD-10-CM

## 2016-02-05 DIAGNOSIS — I110 Hypertensive heart disease with heart failure: Secondary | ICD-10-CM | POA: Diagnosis not present

## 2016-02-05 DIAGNOSIS — I480 Paroxysmal atrial fibrillation: Secondary | ICD-10-CM | POA: Diagnosis present

## 2016-02-05 DIAGNOSIS — Z9861 Coronary angioplasty status: Secondary | ICD-10-CM

## 2016-02-05 DIAGNOSIS — Z7901 Long term (current) use of anticoagulants: Secondary | ICD-10-CM | POA: Insufficient documentation

## 2016-02-05 DIAGNOSIS — T82858A Stenosis of vascular prosthetic devices, implants and grafts, initial encounter: Secondary | ICD-10-CM | POA: Diagnosis not present

## 2016-02-05 DIAGNOSIS — Z87891 Personal history of nicotine dependence: Secondary | ICD-10-CM | POA: Insufficient documentation

## 2016-02-05 DIAGNOSIS — I5032 Chronic diastolic (congestive) heart failure: Secondary | ICD-10-CM | POA: Diagnosis not present

## 2016-02-05 DIAGNOSIS — I2510 Atherosclerotic heart disease of native coronary artery without angina pectoris: Secondary | ICD-10-CM | POA: Diagnosis not present

## 2016-02-05 DIAGNOSIS — I25118 Atherosclerotic heart disease of native coronary artery with other forms of angina pectoris: Secondary | ICD-10-CM | POA: Diagnosis not present

## 2016-02-05 DIAGNOSIS — R9439 Abnormal result of other cardiovascular function study: Secondary | ICD-10-CM | POA: Diagnosis present

## 2016-02-05 DIAGNOSIS — Z794 Long term (current) use of insulin: Secondary | ICD-10-CM | POA: Insufficient documentation

## 2016-02-05 DIAGNOSIS — R079 Chest pain, unspecified: Secondary | ICD-10-CM | POA: Diagnosis present

## 2016-02-05 DIAGNOSIS — I10 Essential (primary) hypertension: Secondary | ICD-10-CM | POA: Diagnosis present

## 2016-02-05 HISTORY — PX: CARDIAC CATHETERIZATION: SHX172

## 2016-02-05 HISTORY — DX: Personal history of other medical treatment: Z92.89

## 2016-02-05 HISTORY — DX: Type 2 diabetes mellitus without complications: E11.9

## 2016-02-05 HISTORY — DX: Atherosclerotic heart disease of native coronary artery without angina pectoris: I25.10

## 2016-02-05 LAB — GLUCOSE, CAPILLARY
GLUCOSE-CAPILLARY: 213 mg/dL — AB (ref 65–99)
GLUCOSE-CAPILLARY: 222 mg/dL — AB (ref 65–99)
GLUCOSE-CAPILLARY: 153 mg/dL — AB (ref 65–99)
Glucose-Capillary: 214 mg/dL — ABNORMAL HIGH (ref 65–99)
Glucose-Capillary: 209 mg/dL — ABNORMAL HIGH (ref 65–99)

## 2016-02-05 LAB — POCT ACTIVATED CLOTTING TIME: Activated Clotting Time: 513 seconds

## 2016-02-05 LAB — PROTIME-INR
INR: 1.26 (ref 0.00–1.49)
PROTHROMBIN TIME: 16 s — AB (ref 11.6–15.2)

## 2016-02-05 SURGERY — LEFT HEART CATH AND CORONARY ANGIOGRAPHY

## 2016-02-05 MED ORDER — HYDRALAZINE HCL 20 MG/ML IJ SOLN
INTRAMUSCULAR | Status: AC
  Filled 2016-02-05: qty 1

## 2016-02-05 MED ORDER — WARFARIN SODIUM 7.5 MG PO TABS
7.5000 mg | ORAL_TABLET | Freq: Once | ORAL | Status: AC
  Administered 2016-02-05: 18:00:00 7.5 mg via ORAL
  Filled 2016-02-05: qty 1

## 2016-02-05 MED ORDER — FAMOTIDINE IN NACL 20-0.9 MG/50ML-% IV SOLN
INTRAVENOUS | Status: DC | PRN
  Administered 2016-02-05: 20 mg via INTRAVENOUS

## 2016-02-05 MED ORDER — FENTANYL CITRATE (PF) 100 MCG/2ML IJ SOLN
INTRAMUSCULAR | Status: DC | PRN
  Administered 2016-02-05 (×2): 25 ug via INTRAVENOUS

## 2016-02-05 MED ORDER — LISINOPRIL-HYDROCHLOROTHIAZIDE 20-25 MG PO TABS: 1.0000 | ORAL_TABLET | Freq: Every day | ORAL | Status: DC

## 2016-02-05 MED ORDER — LISINOPRIL 10 MG PO TABS
20.0000 mg | ORAL_TABLET | Freq: Every day | ORAL | Status: DC
  Administered 2016-02-05 – 2016-02-06 (×2): 20 mg via ORAL
  Filled 2016-02-05 (×2): qty 2

## 2016-02-05 MED ORDER — SODIUM CHLORIDE 0.9 % WEIGHT BASED INFUSION: 3.0000 mL/kg/h | INTRAVENOUS | Status: AC

## 2016-02-05 MED ORDER — BIVALIRUDIN 250 MG IV SOLR
1.7500 mg/kg/h | INTRAVENOUS | Status: AC
  Filled 2016-02-05: qty 250

## 2016-02-05 MED ORDER — SODIUM CHLORIDE 0.9% FLUSH: 3.0000 mL | INTRAVENOUS | Status: DC | PRN

## 2016-02-05 MED ORDER — LABETALOL HCL 5 MG/ML IV SOLN
INTRAVENOUS | Status: DC | PRN
  Administered 2016-02-05: 10 mg via INTRAVENOUS

## 2016-02-05 MED ORDER — SODIUM CHLORIDE 0.9 % IV SOLN
250.0000 mg | INTRAVENOUS | Status: DC | PRN
  Administered 2016-02-05 (×2): 1.75 mg/kg/h via INTRAVENOUS

## 2016-02-05 MED ORDER — BIVALIRUDIN BOLUS VIA INFUSION - CUPID
INTRAVENOUS | Status: DC | PRN
  Administered 2016-02-05: 88.425 mg via INTRAVENOUS

## 2016-02-05 MED ORDER — ACETAMINOPHEN 325 MG PO TABS: 650.0000 mg | ORAL_TABLET | ORAL | Status: DC | PRN

## 2016-02-05 MED ORDER — IOPAMIDOL (ISOVUE-370) INJECTION 76%
INTRAVENOUS | Status: AC
  Filled 2016-02-05: qty 50

## 2016-02-05 MED ORDER — SIMVASTATIN 20 MG PO TABS
40.0000 mg | ORAL_TABLET | Freq: Every day | ORAL | Status: DC
  Administered 2016-02-05: 40 mg via ORAL
  Filled 2016-02-05: qty 2

## 2016-02-05 MED ORDER — HYDROCHLOROTHIAZIDE 25 MG PO TABS
25.0000 mg | ORAL_TABLET | Freq: Every day | ORAL | Status: DC
  Administered 2016-02-05 – 2016-02-06 (×2): 25 mg via ORAL
  Filled 2016-02-05 (×2): qty 1

## 2016-02-05 MED ORDER — HEPARIN SODIUM (PORCINE) 1000 UNIT/ML IJ SOLN
INTRAMUSCULAR | Status: AC
  Filled 2016-02-05: qty 1

## 2016-02-05 MED ORDER — CLOPIDOGREL BISULFATE 75 MG PO TABS
75.0000 mg | ORAL_TABLET | Freq: Every day | ORAL | Status: DC
  Administered 2016-02-06: 75 mg via ORAL
  Filled 2016-02-05: qty 1

## 2016-02-05 MED ORDER — SODIUM CHLORIDE 0.9 % WEIGHT BASED INFUSION: 1.0000 mL/kg/h | INTRAVENOUS | Status: DC

## 2016-02-05 MED ORDER — CLOPIDOGREL BISULFATE 300 MG PO TABS
ORAL_TABLET | ORAL | Status: DC | PRN
Start: 2016-02-05 — End: 2016-02-05
  Administered 2016-02-05: 600 mg via ORAL

## 2016-02-05 MED ORDER — MIDAZOLAM HCL 2 MG/2ML IJ SOLN
INTRAMUSCULAR | Status: DC | PRN
  Administered 2016-02-05: 1 mg via INTRAVENOUS

## 2016-02-05 MED ORDER — METOCLOPRAMIDE HCL 10 MG PO TABS
10.0000 mg | ORAL_TABLET | Freq: Two times a day (BID) | ORAL | Status: DC
  Administered 2016-02-05 – 2016-02-06 (×3): 10 mg via ORAL
  Filled 2016-02-05 (×3): qty 1

## 2016-02-05 MED ORDER — LIDOCAINE HCL (PF) 1 % IJ SOLN
INTRAMUSCULAR | Status: AC
  Filled 2016-02-05: qty 30

## 2016-02-05 MED ORDER — PANTOPRAZOLE SODIUM 40 MG PO TBEC
40.0000 mg | DELAYED_RELEASE_TABLET | Freq: Every day | ORAL | Status: DC
  Administered 2016-02-05 – 2016-02-06 (×2): 40 mg via ORAL
  Filled 2016-02-05 (×2): qty 1

## 2016-02-05 MED ORDER — ASPIRIN 81 MG PO CHEW
81.0000 mg | CHEWABLE_TABLET | ORAL | Status: AC
  Administered 2016-02-05: 81 mg via ORAL

## 2016-02-05 MED ORDER — INSULIN ASPART PROT & ASPART (70-30 MIX) 100 UNIT/ML ~~LOC~~ SUSP
20.0000 [IU] | Freq: Two times a day (BID) | SUBCUTANEOUS | Status: DC
  Administered 2016-02-05 – 2016-02-06 (×2): 20 [IU] via SUBCUTANEOUS
  Filled 2016-02-05: qty 10

## 2016-02-05 MED ORDER — FENTANYL CITRATE (PF) 100 MCG/2ML IJ SOLN
INTRAMUSCULAR | Status: AC
  Filled 2016-02-05: qty 2

## 2016-02-05 MED ORDER — HYDRALAZINE HCL 20 MG/ML IJ SOLN
INTRAMUSCULAR | Status: DC | PRN
  Administered 2016-02-05: 10 mg via INTRAVENOUS

## 2016-02-05 MED ORDER — SODIUM CHLORIDE 0.9 % IV SOLN: 250.0000 mL | INTRAVENOUS | Status: DC | PRN

## 2016-02-05 MED ORDER — ONDANSETRON HCL 4 MG/2ML IJ SOLN: 4.0000 mg | Freq: Four times a day (QID) | INTRAMUSCULAR | Status: DC | PRN

## 2016-02-05 MED ORDER — IOPAMIDOL (ISOVUE-370) INJECTION 76%
INTRAVENOUS | Status: AC
  Filled 2016-02-05: qty 100

## 2016-02-05 MED ORDER — MIDAZOLAM HCL 2 MG/2ML IJ SOLN
INTRAMUSCULAR | Status: AC
  Filled 2016-02-05: qty 2

## 2016-02-05 MED ORDER — ASPIRIN 81 MG PO CHEW
CHEWABLE_TABLET | ORAL | Status: AC
  Administered 2016-02-05: 81 mg via ORAL
  Filled 2016-02-05: qty 1

## 2016-02-05 MED ORDER — SODIUM CHLORIDE 0.9 % WEIGHT BASED INFUSION
3.0000 mL/kg/h | INTRAVENOUS | Status: DC
  Administered 2016-02-05: 3 mL/kg/h via INTRAVENOUS

## 2016-02-05 MED ORDER — LABETALOL HCL 5 MG/ML IV SOLN
INTRAVENOUS | Status: AC
  Filled 2016-02-05: qty 4

## 2016-02-05 MED ORDER — WARFARIN - PHYSICIAN DOSING INPATIENT
Freq: Every day | Status: DC
  Administered 2016-02-05: 18:00:00

## 2016-02-05 MED ORDER — LIDOCAINE HCL (PF) 1 % IJ SOLN
INTRAMUSCULAR | Status: DC | PRN
  Administered 2016-02-05: 2 mL via SUBCUTANEOUS

## 2016-02-05 MED ORDER — FAMOTIDINE IN NACL 20-0.9 MG/50ML-% IV SOLN
INTRAVENOUS | Status: AC
  Filled 2016-02-05: qty 50

## 2016-02-05 MED ORDER — SODIUM CHLORIDE 0.9% FLUSH
3.0000 mL | Freq: Two times a day (BID) | INTRAVENOUS | Status: DC
  Administered 2016-02-05 (×2): 3 mL via INTRAVENOUS

## 2016-02-05 MED ORDER — METOPROLOL TARTRATE 25 MG PO TABS
25.0000 mg | ORAL_TABLET | Freq: Two times a day (BID) | ORAL | Status: DC
  Administered 2016-02-05 – 2016-02-06 (×2): 25 mg via ORAL
  Filled 2016-02-05 (×2): qty 1

## 2016-02-05 MED ORDER — BIVALIRUDIN 250 MG IV SOLR
INTRAVENOUS | Status: AC
  Filled 2016-02-05: qty 250

## 2016-02-05 MED ORDER — HEPARIN SODIUM (PORCINE) 1000 UNIT/ML IJ SOLN
INTRAMUSCULAR | Status: DC | PRN
  Administered 2016-02-05: 6000 [IU] via INTRAVENOUS

## 2016-02-05 MED ORDER — CLOPIDOGREL BISULFATE 300 MG PO TABS
ORAL_TABLET | ORAL | Status: AC
  Filled 2016-02-05: qty 2

## 2016-02-05 MED ORDER — HEPARIN (PORCINE) IN NACL 2-0.9 UNIT/ML-% IJ SOLN
INTRAMUSCULAR | Status: DC | PRN
  Administered 2016-02-05: 08:00:00 via INTRA_ARTERIAL

## 2016-02-05 MED ORDER — INSULIN ASPART 100 UNIT/ML ~~LOC~~ SOLN
0.0000 [IU] | Freq: Three times a day (TID) | SUBCUTANEOUS | Status: DC
  Administered 2016-02-05 (×2): 5 [IU] via SUBCUTANEOUS

## 2016-02-05 MED ORDER — VERAPAMIL HCL 2.5 MG/ML IV SOLN
INTRAVENOUS | Status: AC
  Filled 2016-02-05: qty 2

## 2016-02-05 MED ORDER — HEPARIN (PORCINE) IN NACL 2-0.9 UNIT/ML-% IJ SOLN
INTRAMUSCULAR | Status: DC | PRN
  Administered 2016-02-05: 1000 mL

## 2016-02-05 MED ORDER — HEPARIN (PORCINE) IN NACL 2-0.9 UNIT/ML-% IJ SOLN
INTRAMUSCULAR | Status: AC
  Filled 2016-02-05: qty 1000

## 2016-02-05 MED ORDER — ASPIRIN EC 81 MG PO TBEC
81.0000 mg | DELAYED_RELEASE_TABLET | Freq: Every day | ORAL | Status: DC
  Administered 2016-02-06: 81 mg via ORAL
  Filled 2016-02-05: qty 1

## 2016-02-05 MED ORDER — ANGIOPLASTY BOOK
Freq: Once | Status: AC
  Administered 2016-02-05: 22:00:00
  Filled 2016-02-05: qty 1

## 2016-02-05 MED ORDER — IOPAMIDOL (ISOVUE-370) INJECTION 76%
INTRAVENOUS | Status: DC | PRN
  Administered 2016-02-05: 180 mL via INTRA_ARTERIAL

## 2016-02-05 MED ORDER — GLIMEPIRIDE 4 MG PO TABS
2.0000 mg | ORAL_TABLET | Freq: Every day | ORAL | Status: DC
  Administered 2016-02-06: 2 mg via ORAL
  Filled 2016-02-05: qty 1

## 2016-02-05 SURGICAL SUPPLY — 22 items
BALLN ANGIOSCULPT RX 2.5X15 (BALLOONS) ×2
BALLN TREK RX 2.5X15 (BALLOONS) ×2
BALLN ~~LOC~~ EUPHORA RX 3.0X27 (BALLOONS) ×2
BALLN ~~LOC~~ EUPHORA RX 3.25X15 (BALLOONS) ×2
BALLOON ANGIOSCULPT RX 2.5X15 (BALLOONS) ×1 IMPLANT
BALLOON TREK RX 2.5X15 (BALLOONS) ×1 IMPLANT
BALLOON ~~LOC~~ EUPHORA RX 3.0X27 (BALLOONS) ×1 IMPLANT
BALLOON ~~LOC~~ EUPHORA RX 3.25X15 (BALLOONS) ×1 IMPLANT
CATH INFINITI 5FR MULTPACK ANG (CATHETERS) ×2 IMPLANT
CATH VISTA GUIDE 6FR JL4 (CATHETERS) ×2 IMPLANT
CATH VISTA GUIDE 6FR XBLAD4 (CATHETERS) ×2 IMPLANT
DEVICE RAD COMP TR BAND LRG (VASCULAR PRODUCTS) ×2 IMPLANT
ELECT DEFIB PAD ADLT CADENCE (PAD) ×2 IMPLANT
GLIDESHEATH SLEND A-KIT 6F 22G (SHEATH) ×2 IMPLANT
KIT ENCORE 26 ADVANTAGE (KITS) ×2 IMPLANT
KIT HEART LEFT (KITS) ×2 IMPLANT
PACK CARDIAC CATHETERIZATION (CUSTOM PROCEDURE TRAY) ×2 IMPLANT
STENT SYNERGY DES 2.75X32 (Permanent Stent) ×2 IMPLANT
TRANSDUCER W/STOPCOCK (MISCELLANEOUS) ×2 IMPLANT
TUBING CIL FLEX 10 FLL-RA (TUBING) ×2 IMPLANT
WIRE RUNTHROUGH .014X180CM (WIRE) ×2 IMPLANT
WIRE SAFE-T 1.5MM-J .035X260CM (WIRE) ×2 IMPLANT

## 2016-02-05 NOTE — Interval H&P Note (Signed)
History and Physical Interval Note:  02/05/2016 7:28 AM  [name]  has presented today for surgery, with the diagnosis of abnormal nuc with known LAD disease s/p PCI. The various methods of treatment have been discussed with the patient and family. After consideration of risks, benefits and other options for treatment, the patient has consented to  Procedure(s): Left Heart Cath and Coronary Angiography (N/A) with possible Percutaneous Coronary Intervention as a surgical intervention .  The patient's history has been reviewed, patient examined, no change in status, stable for surgery.  I have reviewed the patient's chart and labs.  Questions were answered to the patient's satisfaction.     Cath Lab Visit (complete for each Cath Lab visit)  Clinical Evaluation Leading to the Procedure:   ACS: No.  Non-ACS:    Anginal Classification: CCS II  Anti-ischemic medical therapy: Minimal Therapy (1 class of medications)  Non-Invasive Test Results: Intermediate-risk stress test findings: cardiac mortality 1-3%/year  Prior CABG: No previous CABG  Ischemic Symptoms? CCS II (Slight limitation of ordinary activity) Anti-ischemic Medical Therapy? Minimal Therapy (1 class of medications) Non-invasive Test Results? Intermediate-risk stress test findings: cardiac mortality 1-3%/year Prior CABG? No Previous CABG   Patient Information:   1-2V CAD, no prox LAD  U (5)  Indication: 16; Score: 5   Patient Information:   CTO of 1 vessel, no other CAD  U (4)  Indication: 26; Score: 4   Patient Information:   1V CAD with prox LAD  U (6)  Indication: 32; Score: 6   Patient Information:   2V-CAD with prox LAD  A (7)  Indication: 38; Score: 7   Patient Information:   3V-CAD without LMCA  A (7)  Indication: 44; Score: 7   Patient Information:   3V-CAD without LMCA With Abnormal LV systolic function  A (9)  Indication: 48; Score: 9   Patient Information:    LMCA-CAD  A (9)  Indication: 49; Score: 9   Patient Information:   2V-CAD with prox LAD PCI  A (7)  Indication: 62; Score: 7   Patient Information:   2V-CAD with prox LAD CABG  A (8)  Indication: 62; Score: 8   Patient Information:   3V-CAD without LMCA With Low CAD burden(i.e., 3 focal stenoses, low SYNTAX score) PCI  A (7)  Indication: 63; Score: 7   Patient Information:   3V-CAD without LMCA With Low CAD burden(i.e., 3 focal stenoses, low SYNTAX score) CABG  A (9)  Indication: 63; Score: 9   Patient Information:   3V-CAD without LMCA E06c - Intermediate-high CAD burden (i.e., multiple diffuse lesions, presence of CTO, or high SYNTAX score) PCI  U (4)  Indication: 64; Score: 4   Patient Information:   3V-CAD without LMCA E06c - Intermediate-high CAD burden (i.e., multiple diffuse lesions, presence of CTO, or high SYNTAX score) CABG  A (9)  Indication: 64; Score: 9   Patient Information:   LMCA-CAD With Isolated LMCA stenosis  PCI  U (6)  Indication: 65; Score: 6   Patient Information:   LMCA-CAD With Isolated LMCA stenosis  CABG  A (9)  Indication: 65; Score: 9   Patient Information:   LMCA-CAD Additional CAD, low CAD burden (i.e., 1- to 2-vessel additional involvement, low SYNTAX score) PCI  U (5)  Indication: 66; Score: 5   Patient Information:   LMCA-CAD Additional CAD, low CAD burden (i.e., 1- to 2-vessel additional involvement, low SYNTAX score) CABG  A (9)  Indication: 66; Score:  9   Patient Information:   LMCA-CAD Additional CAD, intermediate-high CAD burden (i.e., 3-vessel involvement, presence of CTO, or high SYNTAX score) PCI  I (3)  Indication: 67; Score: 3   Patient Information:   LMCA-CAD Additional CAD, intermediate-high CAD burden (i.e., 3-vessel involvement, presence of CTO, or high SYNTAX score) CABG  A (9)  Indication: 67; Score: 9   Yuval Nolet W

## 2016-02-05 NOTE — Progress Notes (Signed)
TR BAND REMOVAL  LOCATION:    left radial  DEFLATED PER PROTOCOL:    Yes.    TIME BAND OFF / DRESSING APPLIED:    1330   SITE UPON ARRIVAL:    Level 0  SITE AFTER BAND REMOVAL:    Level 0  CIRCULATION SENSATION AND MOVEMENT:    Within Normal Limits   Yes.    COMMENTS:   Tolerated procedure well , post TRB instructions given

## 2016-02-05 NOTE — Discharge Planning (Signed)
No review due to OIB status.

## 2016-02-05 NOTE — Research (Signed)
LEADERS FREE II RESEARCH Informed Consent   Subject Name: [name]  Subject met inclusion and exclusion criteria.  The informed consent form, study requirements and expectations were reviewed with the subject and questions and concerns were addressed prior to the signing of the consent form.  The subject verbalized understanding of the trial requirements.  The subject agreed to participate in the LEADERS FREE II trial and signed the informed consent.  The informed consent was obtained prior to performance of any protocol-specific procedures for the subject.  A copy of the signed informed consent was given to the subject and a copy was placed in the subject's medical record.  Desmond Dike H 02/05/2016, 7:00 AM

## 2016-02-05 NOTE — H&P (View-Only) (Signed)
Patient ID: [name], female   DOB: November 04, 1950, 65 y.o.   MRN: [id number]  Patient ID: [name], female   DOB: May 15, 1951, 65 y.o.   MRN: [id number]    Cardiology Office Note    Date:  01/30/2016   ID:  [name], DOB [birth year], MRN [id number]  PCP:  [name], MD  Cardiologist:   Sanda Klein, MD   Chief Complaint  Patient presents with  . Follow-up    follow up from a stress test    History of Present Illness:  [name] is a 65 y.o. female with CAD and PAD, ischemic cardiomyopathy and paroxysmal atrial fibrillation.  After her March 29 office visit, she went to the emergency room with complaints of chest pain on April 10. She was briefly hospitalized. Her chest discomfort was quite different from her previous angina and her cardiac enzymes are normal. She was scheduled for outpatient nuclear study which was performed on April 27. The images are rather poor quality, but nevertheless it is a very abnormal test. The study suggests severe resting ischemia (possibly a component of infarction) in the entire anteroapical distribution. The images actually look remarkably similar to the study she had done before her last LAD percutaneous revascularization in 2015. She has not had any recurrence of the chest discomfort.  Labs checked very recently with Dr. Ashby Dawes. Told her lipid profile was very good, unfortunately hemoglobin A1c was elevated at 8.9%. Today INR 2.8. No bleeding problems on chronic warfarin anticoagulation.  In 2013 she received a drug-eluting stent to the LAD artery. In July 2015 she had high-grade in-stent restenosis treated with balloon angioplasty and placement of a new 312 mm drug-eluting Xience expedition stent to the distal edge of the previous stent. Incidental note made of a 50-60 percent mid AV groove left circumflex stenosis She has been angina free since. She has normal left ventricular systolic function and no congestive  heart failure.  In addition she has history of PAD status post atherectomy of the left superficial femoral artery and paroxysmal atrial fibrillation (on chronic warfarin anticoagulation). Additional comorbid conditions include hypertension, obesity, type 2 diabetes mellitus and gastroesophageal reflux disease. She has a history of left upper lobe lobectomy for adenocarcinoma of the lung with curative intent February 2010. She has a history of inflammatory esophagitis (noneosinophilic), stricture (which was dilated) and cervical web. (Dr. Benson Norway, EGD 2016)  Labs checked very recently with Dr. Ashby Dawes. Told her lipid profile was very good, unfortunately hemoglobin A1c was elevated at 8.9%. Today INR 2.8. No bleeding problems on chronic warfarin anticoagulation.  Past Medical History  Diagnosis Date  . Hypertension   . Hypercholesteremia   . Peripheral vascular disease ([place]) 8/12    Lt SFA PTA  . H/O hiatal hernia   . Arthritis   . Non-STEMI (non-ST elevated myocardial infarction) ([place]) 04/07/2012    See cath results below  . Presence of stent in LAD coronary artery 04/07/2012    Xience Expedition DES 2.75 mm x 18 mm (dilated to 3.0 mm)  . Paroxysmal atrial fibrillation ([place]) 04/07/2012    On Warfarin  . Heart palpitations     monitor 04/19/12-05/10/12- frequent PAF with RVR- coumadin started  . Sleep apnea   . Diabetes mellitus     insulin dependent  . GERD (gastroesophageal reflux disease)     tx meds  . Cancer ([place]) 5/10    [facility]   . Lung cancer ([place])     2010, surgery 18%  left lung no radiation or chemo  . Transfusion history     last april 2015 s/p knee surgery    Past Surgical History  Procedure Laterality Date  . Abdominal hysterectomy  1960  . Cholecystectomy    . Lung surgery  5/10  . Percutaneous coronary angioplasty and stenting  04/07/2012    Xience Expedition DES 2.21m x 18 mm (dilated to 3.0 mm) to the prox LAD   . Orif ankle fracture  07/09/2012    Procedure:  OPEN REDUCTION INTERNAL FIXATION (ORIF) ANKLE FRACTURE;  Surgeon: GMeredith Pel MD;  Location: [facility];  Service: Orthopedics;  Laterality: Left;  open reduction internal fixation trimalleolar ankle fracture medial malleolous fixation  . Cardiac catheterization  11/04/2008    patent RCA, LM, and Circ, nl EF  . Cardiac catheterization  02/20/2002    patent coronaries with the only abnormality being a smooth luminla irregularity in the mid intermediate ramus branch no felt to be hemodynamically significant, nl LV  . Lower extremity angiogram  05/17/11    directional atherectomy to the prox L SFA using a LX Man TurboHawk, ballooned with a FAgilent Technologiesballoon   . Total knee revision Left 11/05/2013    Procedure: LEFT TOTAL KNEE RESECTION;  Surgeon: MMauri Pole MD;  Location: [facility];  Service: Orthopedics;  Laterality: Left;  . Joint replacement Left 2003    Knee  . Foot surgery Bilateral 1993    with screws, on screw removed  . Knee arthroscopy Bilateral     left x2, right x1  . Total knee revision Left 2007    opened in 2006 and cleaned out, 2007 revision  . Total knee revision Left 12/31/2013    Procedure: RE-INPLANTATION LEFT TOTAL KNEE ;  Surgeon: MMauri Pole MD;  [facility]: [facility];  Service: Orthopedics;  Laterality: Left;  . Left heart catheterization with coronary angiogram N/A 04/07/2012    Procedure: LEFT HEART CATHETERIZATION WITH CORONARY ANGIOGRAM;  Surgeon: JLorretta Harp MD;  [facility];  Service: Cardiovascular;  Laterality: N/A;  . Percutaneous coronary stent intervention (pci-s) N/A 04/07/2012    Procedure: PERCUTANEOUS CORONARY STENT INTERVENTION (PCI-S);  Surgeon: JLorretta Harp MD;  [facility];  Service: Cardiovascular;  Laterality: N/A;  . Left heart catheterization with coronary angiogram N/A 05/27/2014    Procedure: LEFT HEART CATHETERIZATION WITH CORONARY ANGIOGRAM;  Surgeon: JLorretta Harp MD; LAD 99% ISR, CFX 50-60%, RCA (dominant) no sig  dz, EF 60%  . Percutaneous coronary stent intervention (pci-s)  05/27/2014    Procedure: PERCUTANEOUS CORONARY STENT INTERVENTION (PCI-S);  Surgeon: JLorretta Harp MD; 3 mm x 12 mm long Xience Xpedition DES to the proximal LAD  . Esophagogastroduodenoscopy N/A 05/23/2015    Procedure: ESOPHAGOGASTRODUODENOSCOPY (EGD);  Surgeon: PCarol Ada MD;  [facility];  Service: Endoscopy;  Laterality: N/A;  . Tonsillectomy    . Eye surgery      catraactes 2013  . Esophagogastroduodenoscopy (egd) with propofol N/A 06/13/2015    Procedure: ESOPHAGOGASTRODUODENOSCOPY (EGD) WITH PROPOFOL;  Surgeon: PCarol Ada MD;  [facility];  Service: Endoscopy;  Laterality: N/A;  . Savory dilation N/A 06/13/2015    Procedure: SAVORY DILATION;  Surgeon: PCarol Ada MD;  [facility];  Service: Endoscopy;  Laterality: N/A;    Current Medications: Outpatient Prescriptions Prior to Visit  Medication Sig Dispense Refill  . aspirin EC 81 MG EC tablet Take 1 tablet (81 mg total) by mouth daily.    .Marland Kitchen  cholecalciferol (VITAMIN D) 1000 UNITS tablet Take 1,000 Units by mouth at bedtime.     Marland Kitchen glimepiride (AMARYL) 2 MG tablet Take 1 tablet by mouth daily.     . insulin NPH-regular Human (NOVOLIN 70/30) (70-30) 100 UNIT/ML injection Inject 28-32 Units into the skin 2 (two) times daily with a meal. Take 32 units in the morning 28 units in the evening    . lisinopril-hydrochlorothiazide (PRINZIDE,ZESTORETIC) 20-25 MG per tablet Take 1 tablet by mouth daily.    . metoCLOPramide (REGLAN) 10 MG tablet Take 10 mg by mouth 2 (two) times daily. 5 mg in the morning then '10mg'$  in at bedtime    . metoprolol tartrate (LOPRESSOR) 25 MG tablet TAKE 1 TABLET BY MOUTH TWICE A DAY 60 tablet 7  . pantoprazole (PROTONIX) 40 MG tablet TAKE 1 TABLET BY MOUTH EVERY DAY 30 tablet 8  . simvastatin (ZOCOR) 40 MG tablet TAKE 1 TABLET BY MOUTH AT BEDTIME 30 tablet 2  . warfarin (COUMADIN) 7.5 MG tablet TAKE 1 TABLET BY  MOUTH EVERY DAY OR AS DIRECTED BY COUMADIN CLINIC 90 tablet 1  . metoprolol tartrate (LOPRESSOR) 25 MG tablet TAKE 1 TABLET BY MOUTH TWICE A DAY (Patient not taking: Reported on 01/30/2016) 60 tablet 1  . warfarin (COUMADIN) 7.5 MG tablet Take 1 tablet by mouth daily or as directed by coumadin clinic (Patient not taking: Reported on 01/30/2016) 30 tablet 3   No facility-administered medications prior to visit.     Allergies:   Levofloxacin and Morphine and related   Social History   Social History  . Marital Status: Single    Spouse Name: N/A  . Number of Children: N/A  . Years of Education: N/A   Occupational History  . Retired    Social History Main Topics  . Smoking status: Former Smoker -- 1.00 packs/day for 20 years    Types: Cigarettes    Quit date: 09/27/1984  . Smokeless tobacco: Never Used  . Alcohol Use: No  . Drug Use: No  . Sexual Activity: Not Asked   Other Topics Concern  . None   Social History Narrative   Patient lives alone.     Family History:  The patient's family history includes Coronary artery disease in her brother; Hypertension in her mother and sister.   ROS:   Please see the history of present illness.    ROS All other systems reviewed and are negative.   PHYSICAL EXAM:   VS:  BP 145/62 mmHg  Pulse 62  Ht '5\' 3"'$  (1.6 m)  Wt 117.935 kg (260 lb)  BMI 46.07 kg/m2   GEN: Well nourished, well developed, in no acute distress HEENT: normal Neck: no JVD, carotid bruits, or masses Cardiac: RRR; no murmurs, rubs, or gallops,no edema  Respiratory:  clear to auscultation bilaterally, normal work of breathing GI: soft, nontender, nondistended, + BS MS: no deformity or atrophy Skin: warm and dry, no rash Neuro:  Alert and Oriented x 3, Strength and sensation are intact Psych: euthymic mood, full affect  Wt Readings from Last 3 Encounters:  01/30/16 117.935 kg (260 lb)  01/22/16 118.842 kg (262 lb)  01/06/16 116.348 kg (256 lb 8 oz)       Studies/Labs Reviewed:   EKG:  EKG is ordered today.  The ekg ordered today demonstrates NSR, RBBB, leftward axis  Recent Labs: 09/15/2015: ALT 13 01/05/2016: BUN 15; Creatinine, Ser 1.28*; Hemoglobin 13.7; Platelets 237; Potassium 4.0; Sodium 138   Lipid Panel  Component Value Date/Time   CHOL 119 01/06/2016 0455   TRIG 90 01/06/2016 0455   HDL 43 01/06/2016 0455   CHOLHDL 2.8 01/06/2016 0455   VLDL 18 01/06/2016 0455   LDLCALC 58 01/06/2016 0455   LDLDIRECT 143* 10/10/2008 2042    ASSESSMENT:    1. Abnormal nuclear cardiac imaging test   2. Coronary artery disease involving native coronary artery of native heart without angina pectoris   3. Pre-procedural laboratory examination   4. Blood clotting disorder (HCC)   5. Other fatigue   6. Chronic diastolic heart failure, NYHA class 2 ([place])   7. PVD (peripheral vascular disease), Lt SFA PTA Aug 2012   8. Essential hypertension   9. DM (diabetes mellitus), type 2 with peripheral vascular complications ([place])   10. Dyslipidemia   11. Morbid obesity due to excess calories ([place])   12. Paroxysmal atrial fibrillation      PLAN:  In order of problems listed above:  CAD: asymptomatic now, but with a markedly abnormal nuclear stress test. I think she should undergo coronary angiography, there is a very high likelihood that she has again severe LAD in-stent restenosis, possibly even a vessel occlusion. Since she is on warfarin, will delay the procedure until next week. She does not have a history of stroke and will not probably require "bridging". She should hold her lisinopril-hydrochlorothiazide on the day of the procedure.CHF: Appears euvolemic. Limited by knee problems, not dyspnea.   1. CHF: Appears euvolemic. Limited by knee problems, not dyspnea. 2. PAD: no claudication at her level of activity 3. HTN: well controlled 4. DM, poorly controlled:  A1c 8.9% 5. HLP:  She reports a good lipid profile, will get the  results 6. Obesity: discussed calorie restriction, avoiding carbs with high glycemic index. 7. PAF: no clinical events; on warfarin without bleeding complications, INR 2.8 today. CHADSVasc 6 (age, gender, DM, CHF, CAD/PAD, HTN).    Medication Adjustments/Labs and Tests Ordered: Current medicines are reviewed at length with the patient today.  Concerns regarding medicines are outlined above.  Medication changes, Labs and Tests ordered today are listed in the Patient Instructions below. Patient Instructions  Your physician has requested that you have a cardiac catheterization with Dr Glenetta Hew on Thursday, May 11th. Cardiac catheterization is used to diagnose and/or treat various heart conditions. Doctors may recommend this procedure for a number of different reasons. The most common reason is to evaluate chest pain. Chest pain can be a symptom of coronary artery disease (CAD), and cardiac catheterization can show whether plaque is narrowing or blocking your heart's arteries. This procedure is also used to evaluate the valves, as well as measure the blood flow and oxygen levels in different parts of your heart. For further information please visit [email or web address].   Following your catheterization, you will not be allowed to drive for 3 days. No lifting, pushing, or pulling greater that 10 pounds is allowed for 1 week.  You will be required to have the following tests prior to the procedure:  1. Blood work-the blood work can be done no more than 7 days prior to the procedure. It can be done at any [facility] lab. There is one downstairs on the first floor of this building and one in the [facility] building [phone]).  2. Chest Xray-the chest xray order has already been placed at the [place].   MEDICATION INSTRUCTIONS: 1. Take Warfarin tomorrow, then HOLD 2. DO NOT TAKE  Lisinipril-HCT the DAY OF THE PROCEDURE 3. The morning of the  procedure, take your diabetes medications at half dose     Mikael Spray, MD  01/30/2016 12:58 PM    [facility] [address], [place], [place]  [address] Phone: [phone]; Fax: [phone]

## 2016-02-06 DIAGNOSIS — I25118 Atherosclerotic heart disease of native coronary artery with other forms of angina pectoris: Secondary | ICD-10-CM

## 2016-02-06 DIAGNOSIS — I110 Hypertensive heart disease with heart failure: Secondary | ICD-10-CM | POA: Diagnosis not present

## 2016-02-06 DIAGNOSIS — T82858A Stenosis of vascular prosthetic devices, implants and grafts, initial encounter: Secondary | ICD-10-CM | POA: Diagnosis not present

## 2016-02-06 DIAGNOSIS — R931 Abnormal findings on diagnostic imaging of heart and coronary circulation: Secondary | ICD-10-CM | POA: Diagnosis not present

## 2016-02-06 DIAGNOSIS — I480 Paroxysmal atrial fibrillation: Secondary | ICD-10-CM | POA: Diagnosis not present

## 2016-02-06 DIAGNOSIS — I5032 Chronic diastolic (congestive) heart failure: Secondary | ICD-10-CM | POA: Diagnosis not present

## 2016-02-06 DIAGNOSIS — Z7902 Long term (current) use of antithrombotics/antiplatelets: Secondary | ICD-10-CM | POA: Diagnosis not present

## 2016-02-06 DIAGNOSIS — I2510 Atherosclerotic heart disease of native coronary artery without angina pectoris: Secondary | ICD-10-CM | POA: Diagnosis not present

## 2016-02-06 DIAGNOSIS — Z7901 Long term (current) use of anticoagulants: Secondary | ICD-10-CM | POA: Diagnosis not present

## 2016-02-06 DIAGNOSIS — Z79899 Other long term (current) drug therapy: Secondary | ICD-10-CM | POA: Diagnosis not present

## 2016-02-06 DIAGNOSIS — Z7982 Long term (current) use of aspirin: Secondary | ICD-10-CM | POA: Diagnosis not present

## 2016-02-06 LAB — BASIC METABOLIC PANEL
ANION GAP: 11 (ref 5–15)
BUN: 9 mg/dL (ref 6–20)
CO2: 26 mmol/L (ref 22–32)
Calcium: 9.2 mg/dL (ref 8.9–10.3)
Chloride: 103 mmol/L (ref 101–111)
Creatinine, Ser: 1.01 mg/dL — ABNORMAL HIGH (ref 0.44–1.00)
GFR calc Af Amer: >60 mL/min (ref >60)
GFR, EST NON AFRICAN AMERICAN: 57 mL/min — AB (ref >60)
Glucose, Bld: 102 mg/dL — ABNORMAL HIGH (ref 65–99)
POTASSIUM: 3.6 mmol/L (ref 3.5–5.1)
Sodium: 140 mmol/L (ref 135–145)

## 2016-02-06 LAB — GLUCOSE, CAPILLARY
GLUCOSE-CAPILLARY: 98 mg/dL (ref 65–99)
GLUCOSE-CAPILLARY: 198 mg/dL — AB (ref 65–99)

## 2016-02-06 LAB — CBC
HEMATOCRIT: 32.8 % — AB (ref 36.0–46.0)
HEMOGLOBIN: 10.6 g/dL — AB (ref 12.0–15.0)
MCH: 27.6 pg (ref 26.0–34.0)
MCHC: 32.3 g/dL (ref 30.0–36.0)
MCV: 85.4 fL (ref 78.0–100.0)
Platelets: 195 10*3/uL (ref 150–400)
RBC: 3.84 MIL/uL — ABNORMAL LOW (ref 3.87–5.11)
RDW: 13 % (ref 11.5–15.5)
WBC: 7.8 10*3/uL (ref 4.0–10.5)

## 2016-02-06 LAB — PROTIME-INR
INR: 1.36 (ref 0.00–1.49)
PROTHROMBIN TIME: 16.9 s — AB (ref 11.6–15.2)

## 2016-02-06 MED ORDER — CLOPIDOGREL BISULFATE 75 MG PO TABS: 75.0000 mg | ORAL_TABLET | Freq: Every day | ORAL | Status: DC

## 2016-02-06 NOTE — Progress Notes (Signed)
CARDIAC REHAB PHASE I   PRE:  Rate/Rhythm: 75 SR  BP:  Supine: 134/42  Sitting:   Standing:    SaO2:   MODE:  Ambulation: 100 ft   POST:  Rate/Rhythm: 114 ST  BP:  Supine:   Sitting: 133/37  Standing:    SaO2: 98%RA [id number] Pt walked 100 ft with her cane with slow steady gait. Pt is limited in mobility due to her knee. Has had 5 surgeries. No CP. Education completed with pt who voiced understanding. Reviewed NTG use, encouraged walking as tolerated with knee, plavix, carb counting and heart healthy diets, and CRP 2. Will refer to [place] and pt will consider.   Graylon Good, RN BSN  02/06/2016 8:44 AM

## 2016-02-06 NOTE — Discharge Summary (Signed)
Discharge Summary    Patient ID: [name],  MRN: [id number], DOB/AGE: 1951/08/07 65 y.o.  Admit date: 02/05/2016 Discharge date: 02/06/2016  Primary Care Provider: [facility] Primary Cardiologist: Dr. Sallyanne Kuster  Discharge Diagnoses    Principal Problem:   Abnormal nuclear stress test Active Problems:   Essential hypertension   Paroxysmal atrial fibrillation   CAD s/p LAD DES 2013 and 2015   ICM, EF 40-45% cath 04/07/12- improved to 50-60% by echo Oct 2013   DM (diabetes mellitus), type 2 with peripheral vascular complications (HCC)   Chronic diastolic heart failure, NYHA class 2 (HCC)   Chest pain with high risk for cardiac etiology   CAD S/P percutaneous coronary angioplasty   Allergies Allergies  Allergen Reactions  . Levofloxacin Itching  . Morphine And Related Itching    Diagnostic Studies/Procedures    Procedures    Coronary Stent Intervention   Left Heart Cath and Coronary Angiography    Conclusion    1. Ost - Proximal LAD lesion, 80% and 99% in-stent restenosis. Post intervention with scoring balloon angioplasty followed by a Synergy DES stent covering the entire stented segment., there is a 0% residual stenosis. The lesion was previously treated with a 2 overlapping DES stents in 2013 and 2015. 2. Prox Cx lesion, 55% stenosed. Stable 3. Ramus lesion, 20% stenosed. 4. The left ventricular systolic function is normal.   The patient has now had 2 episodes of in-stent restenosis in a DES stent. My recommendation would be to continue Plavix lifelong if possible. She is on Coumadin for Afib. Would be OK to stop ASA after ~1 month & continue Plavix.       History of Present Illness   [name] is a 65 y.o. female with CAD and PAD (status post left SFA atherectomy), ischemic cardiomyopathy and paroxysmal atrial fibrillation. She initially had PCI with a Xience DES 2.75 mm x 18 mm in 2013, followed by relook catheterization in August  2015 showing severe in-stent restenosis with upstream lesion as well. The ISR was treated with Cutting Balloon PTCA, and the upstream lesion was treated with a second Xience DES 3.0 mm x 12 mm. She also has a roughly 50-60% mid circumflex lesion.  She was doing well as of March 2017, however on April 10, she presented to the emergency room with chest pain that was somewhat atypical for her previous anginal equivalent. However she was evaluated with a nuclear stress test which showed stents of LAD distribution possible infarct with peri-infarct ischemia. This was read as at least intermediate risk. She is now referred for invasive evaluation to assess for occlusion of the LAD.   Hospital Course     The patient presented to [facility] on [facility]/05/16 for the planned procedure, performed by Dr. Ellyn Hack. She was found to have 80-90% ISR of the previously placed proximal LAD stent. She underwent successful scoring balloon angioplasty, followed by a synergy DES covering the entire stented segment.  There was also a stable 55% proximal circumflex lesion and a 20% Ramus lesion, both treated medically. LVF was normal. She tolerated the procedure well and left th cath lab in stable condition. She was placed on triple therapy with ASA, Plavix and Coumadin. She was also continued on simvastatin, lisinopril and metoprolol. She was monitored overnight and had no post cath complications. She had no recurrent CP. Vital signs, renal function and cath site remained stable. She ambulated w/o difficulty. She was last seen and examined by Dr. Acie Fredrickson  who determined she was stable for discharge home. She will f/u with Dr. Sallyanne Kuster. Plan is to continue triple therapy with ASA, Plavix and Coumadin x 1 month. After 1 month, ASA can be discontinued.   Consultants: none.   Discharge Vitals Blood pressure 133/37, pulse 82, temperature 97.3 F (36.3 C), temperature source Oral, resp. rate 20, height '5\' 3"'$  (1.6 m), weight 268 lb 1.3 oz (121.6  kg), SpO2 98 %.  Filed Weights   02/05/16 0556 02/06/16 0443  Weight: 260 lb (117.935 kg) 268 lb 1.3 oz (121.6 kg)    Labs & Radiologic Studies    CBC  Recent Labs  02/06/16 0449  WBC 7.8  HGB 10.6*  HCT 32.8*  MCV 85.4  PLT 161   Basic Metabolic Panel  Recent Labs  02/06/16 0449  NA 140  K 3.6  CL 103  CO2 26  GLUCOSE 102*  BUN 9  CREATININE 1.01*  CALCIUM 9.2   Liver Function Tests No results for input(s): AST, ALT, ALKPHOS, BILITOT, PROT, ALBUMIN in the last 72 hours. No results for input(s): LIPASE, AMYLASE in the last 72 hours. Cardiac Enzymes No results for input(s): CKTOTAL, CKMB, CKMBINDEX, TROPONINI in the last 72 hours. BNP Invalid input(s): POCBNP D-Dimer No results for input(s): DDIMER in the last 72 hours. Hemoglobin A1C No results for input(s): HGBA1C in the last 72 hours. Fasting Lipid Panel No results for input(s): CHOL, HDL, LDLCALC, TRIG, CHOLHDL, LDLDIRECT in the last 72 hours. Thyroid Function Tests No results for input(s): TSH, T4TOTAL, T3FREE, THYROIDAB in the last 72 hours.  Invalid input(s): FREET3 _____________  No results found. Disposition   Pt is being discharged home today in good condition.  Follow-up Plans & Appointments    Follow-up Information    Follow up with Sanda Klein, MD.   Specialty:  Cardiology   Why:  our office will call you with a follow-up appointment    Contact information:   [address] [place] [address] [phone]      Discharge Instructions    Amb Referral to Cardiac Rehabilitation    Complete by:  As directed   Diagnosis:  Coronary Stents     Diet - low sodium heart healthy    Complete by:  As directed      Increase activity slowly    Complete by:  As directed            Discharge Medications   Current Discharge Medication List    START taking these medications   Details  clopidogrel (PLAVIX) 75 MG tablet Take 1 tablet (75 mg total) by mouth daily with  breakfast. Qty: 30 tablet, Refills: 5      CONTINUE these medications which have NOT CHANGED   Details  aspirin EC 81 MG EC tablet Take 1 tablet (81 mg total) by mouth daily.    cholecalciferol (VITAMIN D) 1000 UNITS tablet Take 1,000 Units by mouth at bedtime.     glimepiride (AMARYL) 2 MG tablet Take 1 tablet by mouth daily.     insulin NPH-regular Human (NOVOLIN 70/30) (70-30) 100 UNIT/ML injection Inject 30-34 Units into the skin 2 (two) times daily with a meal. Take 34 units in the morning 30 units in the evening    lisinopril-hydrochlorothiazide (PRINZIDE,ZESTORETIC) 20-25 MG per tablet Take 1 tablet by mouth daily.    metoCLOPramide (REGLAN) 10 MG tablet Take 10 mg by mouth 2 (two) times daily.     metoprolol tartrate (LOPRESSOR) 25 MG tablet  TAKE 1 TABLET BY MOUTH TWICE A DAY Qty: 60 tablet, Refills: 7    pantoprazole (PROTONIX) 40 MG tablet TAKE 1 TABLET BY MOUTH EVERY DAY Qty: 30 tablet, Refills: 8    simvastatin (ZOCOR) 40 MG tablet TAKE 1 TABLET BY MOUTH AT BEDTIME Qty: 30 tablet, Refills: 2    !! warfarin (COUMADIN) 7.5 MG tablet Take 3.75-7.5 mg by mouth daily. 3.'75mg'$  on Monday, Wednesday, Friday.  7.'5mg'$  all other days.    !! warfarin (COUMADIN) 7.5 MG tablet TAKE 1 TABLET BY MOUTH EVERY DAY OR AS DIRECTED BY COUMADIN CLINIC Qty: 90 tablet, Refills: 1     !! - Potential duplicate medications found. Please discuss with provider.       Aspirin prescribed at discharge?  Yes High Intensity Statin Prescribed? (Lipitor 40-'80mg'$  or Crestor 20-'40mg'$ ): Yes Beta Blocker Prescribed? Yes For EF <40%, was ACEI/ARB Prescribed? Yes ADP Receptor Inhibitor Prescribed? (i.e. Plavix etc.-Includes Medically Managed Patients): Yes For EF <40%, Aldosterone Inhibitor Prescribed? No:  Was EF assessed during THIS hospitalization? Yes Was Cardiac Rehab II ordered? (Included Medically managed Patients): No:    Outstanding Labs/Studies   None.   Duration of Discharge Encounter    Greater than 30 minutes including physician time.  Signed, Lyda Jester PA-C 02/06/2016, 9:43 AM  Attending Note:   The patient was seen and examined.  Agree with assessment and plan as noted above.  Changes made to the above note as needed.  Pt is doing well s/p PCI. No angina. Continue current meds. Will follow up with Dr. Marin Olp, Brooke Bonito., MD, [facility] 02/06/2016, 6:51 PM [address]  [place] Pager [phone]

## 2016-02-17 ENCOUNTER — Other Ambulatory Visit: Payer: Self-pay | Admitting: Cardiovascular Disease

## 2016-02-17 NOTE — Telephone Encounter (Signed)
Rx(s) sent to pharmacy electronically.  

## 2016-02-25 ENCOUNTER — Other Ambulatory Visit: Payer: Self-pay | Admitting: Cardiovascular Disease

## 2016-02-25 NOTE — Telephone Encounter (Signed)
Pantoprazole Refilled #30 tablets with 8 refills on 02/17/2016

## 2016-02-26 ENCOUNTER — Ambulatory Visit (INDEPENDENT_AMBULATORY_CARE_PROVIDER_SITE_OTHER): Payer: Commercial Managed Care - HMO | Admitting: Physician Assistant

## 2016-02-26 ENCOUNTER — Ambulatory Visit (INDEPENDENT_AMBULATORY_CARE_PROVIDER_SITE_OTHER): Payer: Commercial Managed Care - HMO | Admitting: Pharmacist Clinician (PhC)/ Clinical Pharmacy Specialist

## 2016-02-26 ENCOUNTER — Encounter: Payer: Self-pay | Admitting: Physician Assistant

## 2016-02-26 VITALS — BP 142/84 | HR 60 | Ht 63.0 in | Wt 258.0 lb

## 2016-02-26 DIAGNOSIS — I2510 Atherosclerotic heart disease of native coronary artery without angina pectoris: Secondary | ICD-10-CM

## 2016-02-26 DIAGNOSIS — I480 Paroxysmal atrial fibrillation: Secondary | ICD-10-CM | POA: Diagnosis not present

## 2016-02-26 DIAGNOSIS — R0989 Other specified symptoms and signs involving the circulatory and respiratory systems: Secondary | ICD-10-CM

## 2016-02-26 DIAGNOSIS — Z9861 Coronary angioplasty status: Secondary | ICD-10-CM

## 2016-02-26 DIAGNOSIS — I255 Ischemic cardiomyopathy: Secondary | ICD-10-CM | POA: Diagnosis not present

## 2016-02-26 DIAGNOSIS — Z7901 Long term (current) use of anticoagulants: Secondary | ICD-10-CM | POA: Diagnosis not present

## 2016-02-26 LAB — POCT INR: INR: 2.4

## 2016-02-26 NOTE — Progress Notes (Signed)
Cardiology Office Note   Date:  02/26/2016   ID:  [name], DOB 1951/03/09, MRN [id number]  PCP:  [name], MD  Cardiologist:  Dr Juanetta Snow, PA-C   No chief complaint on file.   History of Present Illness: [name] is a 65 y.o. female with a history of DES LAD 2013 & 2015, DM, HTN, HLD, PAD w/ L-SFA PTA, PAF on coumadin, Lung CA.  D/c 05/12 after admit for abnl MV>>cath>>DES LAD for ISR, EF NL.  [name] presents for post hospital follow-up  Since discharge from the hospital, she has not had any chest pain. She is not having any bleeding issues on the combination of baby aspirin, Plavix and Coumadin. She is aware that she is supposed to stop the aspirin a month after the stent was inserted, on June 12.  Her blood pressure is up slightly today and she does not check it at home, so she does not know if it runs up at home or not. She is not having any GI issues. She does not have headaches or blurred vision. She does not know she has ever had a test on her carotid arteries, but she does not remember anything.   Past Medical History  Diagnosis Date  . Hypertension   . Hypercholesteremia   . Peripheral vascular disease ([place]) 8/12    Lt SFA PTA  . H/O hiatal hernia   . Non-STEMI (non-ST elevated myocardial infarction) ([place]) 04/07/2012    See cath results below  . Presence of stent in LAD coronary artery 04/07/2012    Xience Expedition DES 2.75 mm x 18 mm (dilated to 3.0 mm)  . Paroxysmal atrial fibrillation ([place]) 04/07/2012    On Warfarin  . Heart palpitations     monitor 04/19/12-05/10/12- frequent PAF with RVR- coumadin started  . Sleep apnea   . GERD (gastroesophageal reflux disease)     tx meds  . History of blood transfusion "several"    last april 2015 s/p knee surgery (02/05/2016)  . Coronary artery disease   . Type II diabetes mellitus ([facility])     insulin dependent  . Arthritis     "knees" (02/05/2016)  . Cancer  ([place]) 5/10    [facility] Vats   . Lung cancer ([place])     2010, surgery 18% left lung no radiation or chemo    Past Surgical History  Procedure Laterality Date  . Video assisted thoracoscopy (vats)/ lobectomy  01/2009  . Coronary angioplasty with stent placement  04/07/2012    Xience Expedition DES 2.11m x 18 mm (dilated to 3.0 mm) to the prox LAD   . Orif ankle fracture  07/09/2012    Procedure: OPEN REDUCTION INTERNAL FIXATION (ORIF) ANKLE FRACTURE;  Surgeon: GMeredith Pel MD;  Location: [facility];  Service: Orthopedics;  Laterality: Left;  open reduction internal fixation trimalleolar ankle fracture medial malleolous fixation  . Lower extremity angiogram  05/17/11    directional atherectomy to the prox L SFA using a LX Man TurboHawk, ballooned with a F[employer] Technologiesballoon   . Total knee revision Left 11/05/2013    Procedure: LEFT TOTAL KNEE RESECTION;  Surgeon: MMauri Pole MD;  Location: [facility];  Service: Orthopedics;  Laterality: Left;  . Joint replacement    . Hammer toe surgery Bilateral 1993    with screws, on screw removed  . Knee arthroscopy Bilateral     left x2, right x1  . Total knee revision  Left 2007    opened in 2006 and cleaned out, 2007 revision  . Total knee revision Left 12/31/2013    Procedure: RE-INPLANTATION LEFT TOTAL KNEE ;  Surgeon: Mauri Pole, MD;  Location: [facility];  Service: Orthopedics;  Laterality: Left;  . Left heart catheterization with coronary angiogram N/A 04/07/2012    Procedure: LEFT HEART CATHETERIZATION WITH CORONARY ANGIOGRAM;  Surgeon: [name], MD;  Location: [facility];  Service: Cardiovascular;  Laterality: N/A;  . Percutaneous coronary stent intervention (pci-s) N/A 04/07/2012    Procedure: PERCUTANEOUS CORONARY STENT INTERVENTION (PCI-S);  Surgeon: [name], MD;  Location: [facility] CATH LAB;  Service: Cardiovascular;  Laterality: N/A;  . Left heart catheterization with coronary angiogram N/A 05/27/2014    Procedure: LEFT HEART  CATHETERIZATION WITH CORONARY ANGIOGRAM;  Surgeon: [name], MD; LAD 99% ISR, CFX 50-60%, RCA (dominant) no sig dz, EF 60%  . Percutaneous coronary stent intervention (pci-s)  05/27/2014    Procedure: PERCUTANEOUS CORONARY STENT INTERVENTION (PCI-S);  Surgeon: [name], MD; 3 mm x 12 mm long Xience Xpedition DES to the proximal LAD  . Esophagogastroduodenoscopy N/A 05/23/2015    Procedure: ESOPHAGOGASTRODUODENOSCOPY (EGD);  Surgeon: Carol Ada, MD;  Location: [facility];  Service: Endoscopy;  Laterality: N/A;  . Tonsillectomy    . Cataract extraction w/ intraocular lens  implant, bilateral  2013  . Esophagogastroduodenoscopy (egd) with propofol N/A 06/13/2015    Procedure: ESOPHAGOGASTRODUODENOSCOPY (EGD) WITH PROPOFOL;  Surgeon: Carol Ada, MD;  Location: [facility];  Service: Endoscopy;  Laterality: N/A;  . Savory dilation N/A 06/13/2015    Procedure: SAVORY DILATION;  Surgeon: Carol Ada, MD;  Location: [facility];  Service: Endoscopy;  Laterality: N/A;  . Cardiac catheterization  11/04/2008    patent RCA, LM, and Circ, nl EF  . Cardiac catheterization  02/20/2002    patent coronaries with the only abnormality being a smooth luminla irregularity in the mid intermediate ramus branch no felt to be hemodynamically significant, nl LV  . Cardiac catheterization N/A 02/05/2016    Procedure: Left Heart Cath and Coronary Angiography;  Surgeon: Leonie Man, MD;  Location: [facility];  Service: Cardiovascular;  Laterality: N/A;  . Cardiac catheterization N/A 02/05/2016    Procedure: Coronary Stent Intervention;  Surgeon: Leonie Man, MD;  Location: [place] [facility];  Service: Cardiovascular;  Laterality: N/A;  . Coronary angioplasty with stent placement  2013; 2015  . Total knee arthroplasty Left 2003  . Laparoscopic cholecystectomy    . Fracture surgery    . Abdominal hysterectomy  1990  . Dilation and curettage of uterus  <1990    Current Outpatient  Prescriptions  Medication Sig Dispense Refill  . aspirin EC 81 MG EC tablet Take 1 tablet (81 mg total) by mouth daily.    . cholecalciferol (VITAMIN D) 1000 UNITS tablet Take 1,000 Units by mouth at bedtime.     . clopidogrel (PLAVIX) 75 MG tablet Take 1 tablet (75 mg total) by mouth daily with breakfast. 30 tablet 5  . glimepiride (AMARYL) 2 MG tablet Take 1 tablet by mouth daily.     . insulin NPH-regular Human (NOVOLIN 70/30) (70-30) 100 UNIT/ML injection Inject 30-34 Units into the skin 2 (two) times daily with a meal. Take 34 units in the morning 30 units in the evening    . lisinopril-hydrochlorothiazide (PRINZIDE,ZESTORETIC) 20-25 MG per tablet Take 1 tablet by mouth daily.    . metoCLOPramide (REGLAN) 10 MG tablet Take  10 mg by mouth 2 (two) times daily.     . metoprolol tartrate (LOPRESSOR) 25 MG tablet TAKE 1 TABLET BY MOUTH TWICE A DAY 60 tablet 7  . pantoprazole (PROTONIX) 40 MG tablet TAKE 1 TABLET BY MOUTH EVERY DAY 30 tablet 8  . simvastatin (ZOCOR) 40 MG tablet TAKE 1 TABLET BY MOUTH AT BEDTIME 30 tablet 2  . warfarin (COUMADIN) 7.5 MG tablet TAKE 1 TABLET BY MOUTH EVERY DAY OR AS DIRECTED BY COUMADIN CLINIC (Patient not taking: Reported on 02/03/2016) 90 tablet 1  . warfarin (COUMADIN) 7.5 MG tablet Take 3.75-7.5 mg by mouth daily. 3.'75mg'$  on Monday, Wednesday, Friday.  7.'5mg'$  all other days.     No current facility-administered medications for this visit.    Allergies:   Levofloxacin and Morphine and related    Social History:  The patient  reports that she quit smoking about 31 years ago. Her smoking use included Cigarettes. She has a 20 pack-year smoking history. She has never used smokeless tobacco. She reports that she does not drink alcohol or use illicit drugs.   Family History:  The patient's family history includes Coronary artery disease in her brother; Hypertension in her mother and sister.    ROS:  Please see the history of present illness. All other systems are  reviewed and negative.    PHYSICAL EXAM: VS:  BP 142/84 mmHg  Pulse 60  Ht '5\' 3"'$  (1.6 m)  Wt 258 lb (117.028 kg)  BMI 45.71 kg/m2 , BMI Body mass index is 45.71 kg/(m^2). GEN: Well nourished, well developed, female in no acute distress HEENT: normal for age  Neck: no JVD, right carotid bruit, no masses Cardiac: RRR; faint murmur, no rubs, or gallops Respiratory:  clear to auscultation bilaterally, normal work of breathing GI: soft, nontender, nondistended, + BS MS: no deformity or atrophy; trace edema; distal pulses are 2+ in all 4 extremities  Skin: warm and dry, no rash Neuro:  Strength and sensation are intact Psych: euthymic mood, full affect   EKG:  EKG is ordered today. The ekg ordered today demonstrates sinus rhythm, right bundle branch block which is old.  CATH: 1. Ost - Proximal LAD lesion, 80% and 99% in-stent restenosis. Post intervention with scoring balloon angioplasty followed by a 2.75x32 mm Synergy DES covering the entire stented segment., there is a 0% residual stenosis. The lesion was previously treated with a 2 overlapping DES stents in 2013 and 2015. 2. Prox Cx lesion, 55% stenosed. Stable 3. Ramus lesion, 20% stenosed. 4. The left ventricular systolic function is normal. The patient has now had 2 episodes of in-stent restenosis in a DES stent. My recommendation would be to continue Plavix lifelong if possible. She is on Coumadin for Afib. Would be OK to stop ASA after ~1 month & continue Plavix.  Recent Labs: 09/15/2015: ALT 13 01/30/2016: TSH 1.79 02/06/2016: BUN 9; Creatinine, Ser 1.01*; Hemoglobin 10.6*; Platelets 195; Potassium 3.6; Sodium 140    Lipid Panel    Component Value Date/Time   CHOL 119 01/06/2016 0455   TRIG 90 01/06/2016 0455   HDL 43 01/06/2016 0455   CHOLHDL 2.8 01/06/2016 0455   VLDL 18 01/06/2016 0455   LDLCALC 58 01/06/2016 0455   LDLDIRECT 143* 10/10/2008 2042     Wt Readings from Last 3 Encounters:  02/26/16 258 lb  (117.028 kg)  02/06/16 268 lb 1.3 oz (121.6 kg)  01/30/16 260 lb (117.935 kg)     Other studies Reviewed: Additional studies/ records that  were reviewed today include: Hospital records, cath report and other available data.  ASSESSMENT AND PLAN:  1.  CAD: She has had no ischemic symptoms and has done well since she went home the hospital. It was reinforced that she is to stop the aspirin after 30 days, but continue the Plavix. She is on an ACE inhibitor, beta blocker, and a statin. Continue current medical therapy.  2. PAF on Coumadin: Her Coumadin is followed by the Coumadin clinic here and is therapeutic. She is currently maintaining sinus rhythm is new is having no palpitations. Continue current doses of beta blocker.   3. Chronic anticoagulation: CHADS2VASC=5, continue Coumadin per Coumadin clinic  4. Right carotid bruit: We will check carotid Dopplers and follow up on the results.   Current medicines are reviewed at length with the patient today.  The patient does not have concerns regarding medicines.  The following changes have been made:  Stop aspirin at 30 days  Labs/ tests ordered today include:   Orders Placed This Encounter  Procedures  . EKG 12-Lead     Disposition:   FU with Dr Sallyanne Kuster  Signed, Rosaria Ferries, PA-C  02/26/2016 5:29 PM    [facility] Phone: [phone]; Fax: [phone]  This note was written with the assistance of speech recognition software. Please excuse any transcriptional errors.

## 2016-02-26 NOTE — Patient Instructions (Signed)
Medication Instructions: Rosaria Ferries, PA-C, has recommended making the following medication changes: 1. STOP Aspirin on June 11th 2. CONTINUE Clopidogrel (Plavix)  Labwork: NONE ORDERED  Testing/Procedures: 1. Carotid Doppler - Your physician has requested that you have a carotid duplex. This test is an ultrasound of the carotid arteries in your neck. It looks at blood flow through these arteries that supply the brain with blood. Allow one hour for this exam. There are no restrictions or special instructions.  Follow-up: Suanne Marker recommends that you schedule a follow-up appointment in 3 months with Dr Sallyanne Kuster.  If you need a refill on your cardiac medications before your next appointment, please call your pharmacy.

## 2016-03-08 ENCOUNTER — Ambulatory Visit (HOSPITAL_COMMUNITY)
Admission: RE | Admit: 2016-03-08 | Discharge: 2016-03-08 | Disposition: A | Discharge status: Home / Self Care | Payer: Commercial Managed Care - HMO | Source: Ambulatory Visit | Attending: Cardiovascular Disease | Admitting: Cardiovascular Disease

## 2016-03-08 DIAGNOSIS — I2510 Atherosclerotic heart disease of native coronary artery without angina pectoris: Secondary | ICD-10-CM | POA: Insufficient documentation

## 2016-03-08 DIAGNOSIS — I10 Essential (primary) hypertension: Secondary | ICD-10-CM | POA: Insufficient documentation

## 2016-03-08 DIAGNOSIS — I6523 Occlusion and stenosis of bilateral carotid arteries: Secondary | ICD-10-CM | POA: Insufficient documentation

## 2016-03-08 DIAGNOSIS — K219 Gastro-esophageal reflux disease without esophagitis: Secondary | ICD-10-CM | POA: Insufficient documentation

## 2016-03-08 DIAGNOSIS — E7800 Pure hypercholesterolemia, unspecified: Secondary | ICD-10-CM | POA: Diagnosis not present

## 2016-03-08 DIAGNOSIS — E119 Type 2 diabetes mellitus without complications: Secondary | ICD-10-CM | POA: Diagnosis not present

## 2016-03-08 DIAGNOSIS — R0989 Other specified symptoms and signs involving the circulatory and respiratory systems: Secondary | ICD-10-CM | POA: Diagnosis not present

## 2016-03-08 DIAGNOSIS — G4730 Sleep apnea, unspecified: Secondary | ICD-10-CM | POA: Diagnosis not present

## 2016-03-09 DIAGNOSIS — E1065 Type 1 diabetes mellitus with hyperglycemia: Secondary | ICD-10-CM | POA: Diagnosis not present

## 2016-03-24 ENCOUNTER — Other Ambulatory Visit: Payer: Self-pay | Admitting: Cardiovascular Disease

## 2016-03-24 NOTE — Telephone Encounter (Signed)
Rx(s) sent to pharmacy electronically.  

## 2016-03-25 ENCOUNTER — Other Ambulatory Visit: Payer: Self-pay | Admitting: Orthopedic Surgery

## 2016-03-25 ENCOUNTER — Other Ambulatory Visit: Payer: Self-pay | Admitting: Internal Medicine

## 2016-03-25 DIAGNOSIS — Z1231 Encounter for screening mammogram for malignant neoplasm of breast: Secondary | ICD-10-CM

## 2016-04-19 ENCOUNTER — Ambulatory Visit
Admission: RE | Admit: 2016-04-19 | Discharge: 2016-04-19 | Disposition: A | Discharge status: Home / Self Care | Payer: Commercial Managed Care - HMO | Source: Ambulatory Visit | Attending: Orthopedic Surgery | Admitting: Orthopedic Surgery

## 2016-04-19 DIAGNOSIS — Z1231 Encounter for screening mammogram for malignant neoplasm of breast: Secondary | ICD-10-CM | POA: Diagnosis not present

## 2016-05-08 ENCOUNTER — Other Ambulatory Visit: Payer: Self-pay | Admitting: Cardiovascular Disease

## 2016-05-10 ENCOUNTER — Other Ambulatory Visit: Payer: Self-pay

## 2016-05-10 ENCOUNTER — Other Ambulatory Visit: Payer: Self-pay | Admitting: Cardiovascular Disease

## 2016-05-10 MED ORDER — CLOPIDOGREL BISULFATE 75 MG PO TABS: 75.0000 mg | ORAL_TABLET | Freq: Every day | ORAL | 2 refills | Status: DC

## 2016-05-11 ENCOUNTER — Telehealth: Payer: Self-pay | Admitting: Pharmacist

## 2016-05-11 NOTE — Telephone Encounter (Signed)
Calling to schedule Coumadin check.

## 2016-05-28 ENCOUNTER — Ambulatory Visit (INDEPENDENT_AMBULATORY_CARE_PROVIDER_SITE_OTHER): Payer: Commercial Managed Care - HMO | Admitting: Cardiovascular Disease

## 2016-05-28 ENCOUNTER — Encounter: Payer: Self-pay | Admitting: Cardiovascular Disease

## 2016-05-28 ENCOUNTER — Ambulatory Visit (INDEPENDENT_AMBULATORY_CARE_PROVIDER_SITE_OTHER): Payer: Commercial Managed Care - HMO | Admitting: Pharmacist

## 2016-05-28 VITALS — BP 137/57 | HR 67 | Ht 63.0 in | Wt 258.1 lb

## 2016-05-28 DIAGNOSIS — I5032 Chronic diastolic (congestive) heart failure: Secondary | ICD-10-CM

## 2016-05-28 DIAGNOSIS — E1151 Type 2 diabetes mellitus with diabetic peripheral angiopathy without gangrene: Secondary | ICD-10-CM

## 2016-05-28 DIAGNOSIS — I480 Paroxysmal atrial fibrillation: Secondary | ICD-10-CM | POA: Diagnosis not present

## 2016-05-28 DIAGNOSIS — I739 Peripheral vascular disease, unspecified: Secondary | ICD-10-CM

## 2016-05-28 DIAGNOSIS — I10 Essential (primary) hypertension: Secondary | ICD-10-CM | POA: Diagnosis not present

## 2016-05-28 DIAGNOSIS — I25118 Atherosclerotic heart disease of native coronary artery with other forms of angina pectoris: Secondary | ICD-10-CM

## 2016-05-28 DIAGNOSIS — E785 Hyperlipidemia, unspecified: Secondary | ICD-10-CM

## 2016-05-28 DIAGNOSIS — Z7901 Long term (current) use of anticoagulants: Secondary | ICD-10-CM

## 2016-05-28 LAB — POCT INR: INR: 2.5

## 2016-05-28 NOTE — Progress Notes (Signed)
Patient ID: [name], female   DOB: 09-05-1951, 65 y.o.   MRN: [id number]  Patient ID: [name], female   DOB: 01-16-1951, 65 y.o.   MRN: [id number]    Cardiology Office Note    Date:  05/28/2016   ID:  [name], DOB 10-24-1950, MRN [id number]  PCP:  [name], MD  Cardiologist:   Sanda Klein, MD   Chief Complaint  Patient presents with  . Follow-up    3 month check up, pt has no complaints    History of Present Illness:  [name] is a 65 y.o. female with CAD and PAD, ischemic cardiomyopathy and paroxysmal atrial fibrillation.  She is now almost 4 months status post her most recent percutaneous intervention when she received a drug-eluting stent in the proximal LAD artery, where there were 2 older stents with in-stent restenosis. She therefore has a fairly lengthy segment of "stent sandwich" in that vessel.  Since the procedure she has not had problems with chest pain at rest or with exertion or any issues with exertional dyspnea. She has an occasional left calf cramp but this occurs when she lies in bed, not when she walks. She is unable to walk fast due to orthopedic problems and uses a cane. Has not had recent palpitations or focal neurological complaints. Denies any bleeding issues.    Post-Intervention Diagram     Implants     Permanent Stent  Stent Synergy Des [id number] - [id number] - Implanted    Inventory item: Stent Synergy Des 2.75x32 Model/Cat number: [id number]  Manufacturer: [employer] Lot number: [id number]  Device identifier: [id number] Device identifier type: GS1       In 2013 she received a drug-eluting stent to the LAD artery. In July 2015 she had high-grade in-stent restenosis treated with balloon angioplasty and placement of a new 312 mm drug-eluting Xience expedition stent to the distal edge of the previous stent. Incidental note made of a 50-60 percent mid AV groove left circumflex stenosis. In  April 2017 she presented with chest pain and a nuclear study showed reversible ischemia after which she received another drug-eluting stent covering the 2 previous stents in the proximal LAD. She has normal left ventricular systolic function and no congestive heart failure.  In addition she has history of PAD status post atherectomy of the left superficial femoral artery and paroxysmal atrial fibrillation (on chronic warfarin anticoagulation). Additional comorbid conditions include hypertension, obesity, type 2 diabetes mellitus and gastroesophageal reflux disease. She has a history of left upper lobe lobectomy for adenocarcinoma of the lung with curative intent February 2010. She has a history of inflammatory esophagitis (noneosinophilic), stricture (which was dilated) and cervical web. (Dr. Benson Norway, EGD 2016)    Past Medical History:  Diagnosis Date  . Arthritis    "knees" (02/05/2016)  . Cancer ([place]) 5/10   [facility]   . Coronary artery disease   . GERD (gastroesophageal reflux disease)    tx meds  . H/O hiatal hernia   . Heart palpitations    monitor 04/19/12-05/10/12- frequent PAF with RVR- coumadin started  . History of blood transfusion "several"   last april 2015 s/p knee surgery (02/05/2016)  . Hypercholesteremia   . Hypertension   . Lung cancer ([place])    2010, surgery 18% left lung no radiation or chemo  . Non-STEMI (non-ST elevated myocardial infarction) ([place]) 04/07/2012   See cath results below  . Paroxysmal atrial fibrillation ([place]) 04/07/2012   On Warfarin  .  Peripheral vascular disease ([place]) 8/12   Lt SFA PTA  . Presence of stent in LAD coronary artery 04/07/2012   Xience Expedition DES 2.75 mm x 18 mm (dilated to 3.0 mm)  . Sleep apnea   . Type II diabetes mellitus (HCC)    insulin dependent    Past Surgical History:  Procedure Laterality Date  . ABDOMINAL HYSTERECTOMY  1990  . CARDIAC CATHETERIZATION  11/04/2008   patent RCA, LM, and Circ, nl EF  . CARDIAC CATHETERIZATION   02/20/2002   patent coronaries with the only abnormality being a smooth luminla irregularity in the mid intermediate ramus branch no felt to be hemodynamically significant, nl LV  . CARDIAC CATHETERIZATION N/A 02/05/2016   Procedure: Left Heart Cath and Coronary Angiography;  Surgeon: Leonie Man, MD;  Location: [facility];  Service: Cardiovascular;  Laterality: N/A;  . CARDIAC CATHETERIZATION N/A 02/05/2016   Procedure: Coronary Stent Intervention;  Surgeon: Leonie Man, MD;  Location: [place] [facility];  Service: Cardiovascular;  Laterality: N/A;  . CATARACT EXTRACTION W/ INTRAOCULAR LENS  IMPLANT, BILATERAL  2013  . CORONARY ANGIOPLASTY WITH STENT PLACEMENT  04/07/2012   Xience Expedition DES 2.13m x 18 mm (dilated to 3.0 mm) to the prox LAD   . CORONARY ANGIOPLASTY WITH STENT PLACEMENT  2013; 2015  . DILATION AND CURETTAGE OF UTERUS  <1990  . ESOPHAGOGASTRODUODENOSCOPY N/A 05/23/2015   Procedure: ESOPHAGOGASTRODUODENOSCOPY (EGD);  Surgeon: PCarol Ada MD;  [facility];  Service: Endoscopy;  Laterality: N/A;  . ESOPHAGOGASTRODUODENOSCOPY (EGD) WITH PROPOFOL N/A 06/13/2015   Procedure: ESOPHAGOGASTRODUODENOSCOPY (EGD) WITH PROPOFOL;  Surgeon: PCarol Ada MD;  [facility]: [facility];  Service: Endoscopy;  Laterality: N/A;  . FRACTURE SURGERY    . HAMMER TOE SURGERY Bilateral 1993   with screws, on screw removed  . JOINT REPLACEMENT    . KNEE ARTHROSCOPY Bilateral    left x2, right x1  . LAPAROSCOPIC CHOLECYSTECTOMY    . LEFT HEART CATHETERIZATION WITH CORONARY ANGIOGRAM N/A 04/07/2012   Procedure: LEFT HEART CATHETERIZATION WITH CORONARY ANGIOGRAM;  Surgeon: JLorretta Harp MD;  [facility];  Service: Cardiovascular;  Laterality: N/A;  . LEFT HEART CATHETERIZATION WITH CORONARY ANGIOGRAM N/A 05/27/2014   Procedure: LEFT HEART CATHETERIZATION WITH CORONARY ANGIOGRAM;  Surgeon: JLorretta Harp MD; LAD 99% ISR, CFX 50-60%, RCA (dominant) no sig dz, EF  60%  . LOWER EXTREMITY ANGIOGRAM  05/17/11   directional atherectomy to the prox L SFA using a LX Man TurboHawk, ballooned with a FAgilent Technologiesballoon   . ORIF ANKLE FRACTURE  07/09/2012   Procedure: OPEN REDUCTION INTERNAL FIXATION (ORIF) ANKLE FRACTURE;  Surgeon: GMeredith Pel MD;  Location: [facility];  Service: Orthopedics;  Laterality: Left;  open reduction internal fixation trimalleolar ankle fracture medial malleolous fixation  . PERCUTANEOUS CORONARY STENT INTERVENTION (PCI-S) N/A 04/07/2012   Procedure: PERCUTANEOUS CORONARY STENT INTERVENTION (PCI-S);  Surgeon: JLorretta Harp MD;  [facility];  Service: Cardiovascular;  Laterality: N/A;  . PERCUTANEOUS CORONARY STENT INTERVENTION (PCI-S)  05/27/2014   Procedure: PERCUTANEOUS CORONARY STENT INTERVENTION (PCI-S);  Surgeon: JLorretta Harp MD; 3 mm x 12 mm long Xience Xpedition DES to the proximal LAD  . SAVORY DILATION N/A 06/13/2015   Procedure: SAVORY DILATION;  Surgeon: PCarol Ada MD;  [facility];  Service: Endoscopy;  Laterality: N/A;  . TONSILLECTOMY    . TOTAL KNEE ARTHROPLASTY Left 2003  . TOTAL KNEE REVISION Left 11/05/2013   Procedure:  LEFT TOTAL KNEE RESECTION;  Surgeon: Mauri Pole, MD;  Location: [facility];  Service: Orthopedics;  Laterality: Left;  . TOTAL KNEE REVISION Left 2007   opened in 2006 and cleaned out, 2007 revision  . TOTAL KNEE REVISION Left 12/31/2013   Procedure: RE-INPLANTATION LEFT TOTAL KNEE ;  Surgeon: Mauri Pole, MD;  Location: [facility];  Service: Orthopedics;  Laterality: Left;  Marland Kitchen VIDEO ASSISTED THORACOSCOPY (VATS)/ LOBECTOMY  01/2009    Current Medications: Outpatient Medications Prior to Visit  Medication Sig Dispense Refill  . cholecalciferol (VITAMIN D) 1000 UNITS tablet Take 1,000 Units by mouth at bedtime.     . clopidogrel (PLAVIX) 75 MG tablet Take 1 tablet (75 mg total) by mouth daily. 90 tablet 2  . glimepiride (AMARYL) 2 MG tablet Take 1 tablet by mouth daily.       . insulin NPH-regular Human (NOVOLIN 70/30) (70-30) 100 UNIT/ML injection Inject 30-34 Units into the skin 2 (two) times daily with a meal. Take 34 units in the morning 30 units in the evening    . lisinopril-hydrochlorothiazide (PRINZIDE,ZESTORETIC) 20-25 MG per tablet Take 1 tablet by mouth daily.    . metoCLOPramide (REGLAN) 10 MG tablet Take 10 mg by mouth 2 (two) times daily.     . metoprolol tartrate (LOPRESSOR) 25 MG tablet TAKE 1 TABLET BY MOUTH TWICE A DAY 60 tablet 7  . pantoprazole (PROTONIX) 40 MG tablet TAKE 1 TABLET BY MOUTH EVERY DAY 30 tablet 8  . simvastatin (ZOCOR) 40 MG tablet TAKE 1 TABLET BY MOUTH AT BEDTIME 30 tablet 2  . warfarin (COUMADIN) 7.5 MG tablet TAKE 1 TABLET BY MOUTH EVERY DAY OR AS DIRECTED BY COUMADIN CLINIC 90 tablet 0  . aspirin EC 81 MG EC tablet Take 1 tablet (81 mg total) by mouth daily. (Patient not taking: Reported on 05/28/2016)    . simvastatin (ZOCOR) 40 MG tablet Take 1 tablet (40 mg total) by mouth at bedtime. (Patient not taking: Reported on 05/28/2016) 90 tablet 2   No facility-administered medications prior to visit.      Allergies:   Levofloxacin and Morphine and related   Social History   Social History  . Marital status: Single    Spouse name: N/A  . Number of children: N/A  . Years of education: N/A   Occupational History  . Retired    Social History Main Topics  . Smoking status: Former Smoker    Packs/day: 1.00    Years: 20.00    Types: Cigarettes    Quit date: 09/27/1984  . Smokeless tobacco: Never Used  . Alcohol use No  . Drug use: No  . Sexual activity: No   Other Topics Concern  . None   Social History Narrative   Patient lives alone.     Family History:  The patient's family history includes Coronary artery disease in her brother; Hypertension in her mother and sister.   ROS:   Please see the history of present illness.    ROS All other systems reviewed and are negative.   PHYSICAL EXAM:   VS:  BP (!) 137/57  (BP Location: Left Arm, Patient Position: Sitting, Cuff Size: Large)   Pulse 67   Ht '5\' 3"'$  (1.6 m)   Wt 258 lb 1.6 oz (117.1 kg)   SpO2 97%   BMI 45.72 kg/m    GEN: Well nourished, well developed, in no acute distress  HEENT: normal  Neck: no JVD, carotid bruits, or masses Cardiac:  RRR; no murmurs, rubs, or gallops,no edema  Respiratory:  clear to auscultation bilaterally, normal work of breathing GI: soft, nontender, nondistended, + BS MS: no deformity or atrophy  Skin: warm and dry, no rash Neuro:  Alert and Oriented x 3, Strength and sensation are intact Psych: euthymic mood, full affect  Wt Readings from Last 3 Encounters:  05/28/16 258 lb 1.6 oz (117.1 kg)  02/26/16 258 lb (117 kg)  02/06/16 268 lb 1.3 oz (121.6 kg)      Studies/Labs Reviewed:   EKG:  EKG is not ordered today.    Recent Labs: 09/15/2015: ALT 13 01/30/2016: TSH 1.79 02/06/2016: BUN 9; Creatinine, Ser 1.01; Hemoglobin 10.6; Platelets 195; Potassium 3.6; Sodium 140   Lipid Panel    Component Value Date/Time   CHOL 119 01/06/2016 0455   TRIG 90 01/06/2016 0455   HDL 43 01/06/2016 0455   CHOLHDL 2.8 01/06/2016 0455   VLDL 18 01/06/2016 0455   LDLCALC 58 01/06/2016 0455   LDLDIRECT 143 (H) 10/10/2008 2042    ASSESSMENT:    1. Coronary artery disease involving native coronary artery with other forms of angina pectoris ([place])   2. Chronic diastolic heart failure, NYHA class 2 ([place])   3. Essential hypertension   4. DM (diabetes mellitus), type 2 with peripheral vascular complications ([place])   5. Dyslipidemia   6. Morbid obesity due to excess calories (HCC)   7. Paroxysmal atrial fibrillation   8. PVD (peripheral vascular disease), Lt SFA PTA Aug 2012      PLAN:  In order of problems listed above:  1. CAD: asymptomatic now. She has had 3 separate percutaneous interventions the proximal LAD, twice complicated by restenosis. The likelihood of another episode of restenosis is high and she understands  that if this occurs she will be best suited for bypass surgery with single vessel LIMA to LAD. It is possible that this can be performed without cardiac pulmonary bypass. Interestingly, her previous pattern of restenosis has been a little delayed, occurring 18-24 months after the index procedure. 2. CHF: Appears euvolemic. Limited by knee problems, not dyspnea. 3. HTN: well controlled 4. DM, poorly controlled:  A1c 8.5% per her report, which is an improvement 5. HLP:  good lipid profile. 6. Obesity: discussed calorie restriction, avoiding carbs with high glycemic index. 7. PAF: no clinical events; on warfarin without bleeding complications, INR therapeutic today. CHADSVasc 6 (age, gender, DM, CHF, CAD/PAD, HTN). 8. PAD: No clear evidence of intermittent claudication, although ischemia may be masked by her sedentary lifestyle.    Medication Adjustments/Labs and Tests Ordered: Current medicines are reviewed at length with the patient today.  Concerns regarding medicines are outlined above.  Medication changes, Labs and Tests ordered today are listed in the Patient Instructions below. Patient Instructions  Dr Sallyanne Kuster recommends that you schedule a follow-up appointment in MAY 2018. You will receive a reminder letter in the mail two months in advance. If you don't receive a letter, please call our office to schedule the follow-up appointment.  If you need a refill on your cardiac medications before your next appointment, please call your pharmacy.      Signed, Sanda Klein, MD  05/28/2016 2:00 PM    [address], [place], [place]  [address] Phone: [phone]; Fax: [phone]

## 2016-05-28 NOTE — Patient Instructions (Signed)
Dr Sallyanne Kuster recommends that you schedule a follow-up appointment in MAY 2018. You will receive a reminder letter in the mail two months in advance. If you don't receive a letter, please call our office to schedule the follow-up appointment.  If you need a refill on your cardiac medications before your next appointment, please call your pharmacy.

## 2016-06-01 ENCOUNTER — Other Ambulatory Visit: Payer: Self-pay

## 2016-06-01 MED ORDER — PANTOPRAZOLE SODIUM 40 MG PO TBEC: 40.0000 mg | DELAYED_RELEASE_TABLET | Freq: Every day | ORAL | 3 refills | Status: DC

## 2016-06-09 DIAGNOSIS — E1065 Type 1 diabetes mellitus with hyperglycemia: Secondary | ICD-10-CM | POA: Diagnosis not present

## 2016-06-21 ENCOUNTER — Other Ambulatory Visit: Payer: Self-pay | Admitting: Pharmacist

## 2016-06-21 NOTE — Patient Outreach (Signed)
Outreach call to [employer] regarding her request for follow up from the [facility] Medication Adherence Campaign. Called to speak with patient, but patient does not answer and no voicemail picks up.  Harlow Asa, PharmD Clinical Pharmacist [facility] [phone]

## 2016-07-01 DIAGNOSIS — E782 Mixed hyperlipidemia: Secondary | ICD-10-CM | POA: Diagnosis not present

## 2016-07-01 DIAGNOSIS — E1065 Type 1 diabetes mellitus with hyperglycemia: Secondary | ICD-10-CM | POA: Diagnosis not present

## 2016-07-06 DIAGNOSIS — I10 Essential (primary) hypertension: Secondary | ICD-10-CM | POA: Diagnosis not present

## 2016-07-06 DIAGNOSIS — I482 Chronic atrial fibrillation: Secondary | ICD-10-CM | POA: Diagnosis not present

## 2016-07-06 DIAGNOSIS — Z23 Encounter for immunization: Secondary | ICD-10-CM | POA: Diagnosis not present

## 2016-07-06 DIAGNOSIS — E1065 Type 1 diabetes mellitus with hyperglycemia: Secondary | ICD-10-CM | POA: Diagnosis not present

## 2016-07-06 DIAGNOSIS — E782 Mixed hyperlipidemia: Secondary | ICD-10-CM | POA: Diagnosis not present

## 2016-07-09 ENCOUNTER — Ambulatory Visit (INDEPENDENT_AMBULATORY_CARE_PROVIDER_SITE_OTHER): Payer: Commercial Managed Care - HMO | Admitting: Pharmacist Clinician (PhC)/ Clinical Pharmacy Specialist

## 2016-07-09 DIAGNOSIS — I480 Paroxysmal atrial fibrillation: Secondary | ICD-10-CM | POA: Diagnosis not present

## 2016-07-09 DIAGNOSIS — Z7901 Long term (current) use of anticoagulants: Secondary | ICD-10-CM | POA: Diagnosis not present

## 2016-07-09 LAB — POCT INR: INR: 1.8

## 2016-07-22 ENCOUNTER — Other Ambulatory Visit: Payer: Self-pay | Admitting: Cardiovascular Disease

## 2016-08-06 ENCOUNTER — Ambulatory Visit (INDEPENDENT_AMBULATORY_CARE_PROVIDER_SITE_OTHER): Payer: Commercial Managed Care - HMO | Admitting: Pharmacist

## 2016-08-06 DIAGNOSIS — Z7901 Long term (current) use of anticoagulants: Secondary | ICD-10-CM

## 2016-08-06 DIAGNOSIS — I480 Paroxysmal atrial fibrillation: Secondary | ICD-10-CM | POA: Diagnosis not present

## 2016-08-06 LAB — POCT INR: INR: 2

## 2016-08-25 ENCOUNTER — Other Ambulatory Visit: Payer: Self-pay | Admitting: Cardiovascular Disease

## 2016-09-08 DIAGNOSIS — E1065 Type 1 diabetes mellitus with hyperglycemia: Secondary | ICD-10-CM | POA: Diagnosis not present

## 2016-09-30 ENCOUNTER — Ambulatory Visit (INDEPENDENT_AMBULATORY_CARE_PROVIDER_SITE_OTHER): Payer: Commercial Managed Care - HMO | Admitting: Pharmacist Clinician (PhC)/ Clinical Pharmacy Specialist

## 2016-09-30 DIAGNOSIS — I480 Paroxysmal atrial fibrillation: Secondary | ICD-10-CM | POA: Diagnosis not present

## 2016-09-30 DIAGNOSIS — Z7901 Long term (current) use of anticoagulants: Secondary | ICD-10-CM | POA: Diagnosis not present

## 2016-09-30 LAB — POCT INR: INR: 2.3

## 2016-10-19 DIAGNOSIS — E782 Mixed hyperlipidemia: Secondary | ICD-10-CM | POA: Diagnosis not present

## 2016-10-19 DIAGNOSIS — E1065 Type 1 diabetes mellitus with hyperglycemia: Secondary | ICD-10-CM | POA: Diagnosis not present

## 2016-10-22 ENCOUNTER — Other Ambulatory Visit: Payer: Self-pay | Admitting: Cardiovascular Disease

## 2016-10-22 NOTE — Telephone Encounter (Signed)
Rx(s) sent to pharmacy electronically.  

## 2016-10-26 DIAGNOSIS — E1065 Type 1 diabetes mellitus with hyperglycemia: Secondary | ICD-10-CM | POA: Diagnosis not present

## 2016-10-26 DIAGNOSIS — E782 Mixed hyperlipidemia: Secondary | ICD-10-CM | POA: Diagnosis not present

## 2016-10-26 DIAGNOSIS — I10 Essential (primary) hypertension: Secondary | ICD-10-CM | POA: Diagnosis not present

## 2016-10-29 DIAGNOSIS — E113493 Type 2 diabetes mellitus with severe nonproliferative diabetic retinopathy without macular edema, bilateral: Secondary | ICD-10-CM | POA: Diagnosis not present

## 2016-10-29 DIAGNOSIS — E11349 Type 2 diabetes mellitus with severe nonproliferative diabetic retinopathy without macular edema: Secondary | ICD-10-CM | POA: Diagnosis not present

## 2016-10-29 DIAGNOSIS — H26493 Other secondary cataract, bilateral: Secondary | ICD-10-CM | POA: Diagnosis not present

## 2016-10-29 DIAGNOSIS — H52203 Unspecified astigmatism, bilateral: Secondary | ICD-10-CM | POA: Diagnosis not present

## 2016-11-08 ENCOUNTER — Ambulatory Visit (INDEPENDENT_AMBULATORY_CARE_PROVIDER_SITE_OTHER): Payer: Medicare HMO | Admitting: Pharmacist

## 2016-11-08 DIAGNOSIS — Z7901 Long term (current) use of anticoagulants: Secondary | ICD-10-CM | POA: Diagnosis not present

## 2016-11-08 DIAGNOSIS — I480 Paroxysmal atrial fibrillation: Secondary | ICD-10-CM

## 2016-11-08 LAB — POCT INR: INR: 2.3

## 2016-11-22 ENCOUNTER — Other Ambulatory Visit: Payer: Self-pay | Admitting: Cardiovascular Disease

## 2016-12-10 DIAGNOSIS — E1065 Type 1 diabetes mellitus with hyperglycemia: Secondary | ICD-10-CM | POA: Diagnosis not present

## 2016-12-27 ENCOUNTER — Ambulatory Visit (INDEPENDENT_AMBULATORY_CARE_PROVIDER_SITE_OTHER): Payer: Medicare HMO | Admitting: Pharmacist Clinician (PhC)/ Clinical Pharmacy Specialist

## 2016-12-27 DIAGNOSIS — Z7901 Long term (current) use of anticoagulants: Secondary | ICD-10-CM

## 2016-12-27 DIAGNOSIS — I480 Paroxysmal atrial fibrillation: Secondary | ICD-10-CM | POA: Diagnosis not present

## 2016-12-27 LAB — POCT INR: INR: 2.4

## 2017-02-04 ENCOUNTER — Other Ambulatory Visit: Payer: Self-pay | Admitting: Cardiovascular Disease

## 2017-02-04 NOTE — Telephone Encounter (Signed)
REFILL 

## 2017-02-08 ENCOUNTER — Ambulatory Visit (INDEPENDENT_AMBULATORY_CARE_PROVIDER_SITE_OTHER): Payer: Medicare HMO | Admitting: Pharmacist Clinician (PhC)/ Clinical Pharmacy Specialist

## 2017-02-08 DIAGNOSIS — Z7901 Long term (current) use of anticoagulants: Secondary | ICD-10-CM

## 2017-02-08 DIAGNOSIS — I480 Paroxysmal atrial fibrillation: Secondary | ICD-10-CM

## 2017-02-08 LAB — POCT INR: INR: 2.5

## 2017-02-09 ENCOUNTER — Encounter: Payer: Self-pay | Admitting: Gynecology

## 2017-02-15 DIAGNOSIS — Z0000 Encounter for general adult medical examination without abnormal findings: Secondary | ICD-10-CM | POA: Diagnosis not present

## 2017-02-15 DIAGNOSIS — E1065 Type 1 diabetes mellitus with hyperglycemia: Secondary | ICD-10-CM | POA: Diagnosis not present

## 2017-02-15 DIAGNOSIS — E782 Mixed hyperlipidemia: Secondary | ICD-10-CM | POA: Diagnosis not present

## 2017-02-15 DIAGNOSIS — Z23 Encounter for immunization: Secondary | ICD-10-CM | POA: Diagnosis not present

## 2017-02-17 ENCOUNTER — Other Ambulatory Visit: Payer: Self-pay | Admitting: Cardiovascular Disease

## 2017-02-18 ENCOUNTER — Other Ambulatory Visit: Payer: Self-pay | Admitting: Cardiovascular Disease

## 2017-02-18 NOTE — Telephone Encounter (Signed)
Rx(s) sent to pharmacy electronically.  

## 2017-02-20 NOTE — Progress Notes (Signed)
Patient ID: [name], female   DOB: 24-Jul-1951, 66 y.o.   MRN: [id number]  Patient ID: VICTORIAN GUNN, female   DOB: 04-Sep-1951, 66 y.o.   MRN: [id number]    Cardiology Office Note    Date:  02/22/2017   ID:  DHYANA BASTONE, DOB 09/12/1951, MRN [id number]  PCP:  [name], MD  Cardiologist:   Sanda Klein, MD   Chief Complaint  Patient presents with  . Follow-up    pt c/o swelling in ankle    History of Present Illness:  [name] is a 66 y.o. female with CAD and PAD, ischemic cardiomyopathy, CHF and paroxysmal atrial fibrillation.  She is now 12 months status post her most recent percutaneous intervention when she received a drug-eluting stent in the proximal LAD artery, where there were 2 older stents with in-stent restenosis. She therefore has a fairly lengthy segment of "stent sandwich" in that vessel.  Since the procedure she has not had problems with chest pain at rest or with exertion or any issues with exertional dyspnea. She has an occasional left calf cramp but this occurs when she lies in bed, not when she walks. She is unable to walk fast due to orthopedic problems and uses a cane. She has had 5 surgeries on the left knee and has significant arthritis on the right as well. Has not had recent palpitations or focal neurological complaints. Denies any bleeding issues.  CATH 02/05/2016   Post-Intervention Diagram     Implants     Permanent Stent  Stent Synergy Des 2.75x32 - [id number] - Implanted    Inventory item: Stent Synergy Des 2.75x32 Model/Cat number: [id number]  Manufacturer: [employer] Lot number: [id number]  Device identifier: [id number] Device identifier type: GS1       In 2013 she received a drug-eluting stent to the LAD artery. In July 2015 she had high-grade in-stent restenosis treated with balloon angioplasty and placement of a new 312 mm drug-eluting Xience expedition stent to the distal edge of the  previous stent. Incidental note made of a 50-60 percent mid AV groove left circumflex stenosis. In April 2017 she presented with chest pain and a nuclear study showed reversible ischemia after which she received another drug-eluting stent covering the 2 previous stents in the proximal LAD. She has normal left ventricular systolic function and no congestive heart failure.  In addition she has history of PAD status post atherectomy of the left superficial femoral artery and paroxysmal atrial fibrillation (on chronic warfarin anticoagulation). Additional comorbid conditions include hypertension, obesity, type 2 diabetes mellitus and gastroesophageal reflux disease. She has a history of left upper lobe lobectomy for adenocarcinoma of the lung with curative intent February 2010. She has a history of inflammatory esophagitis (noneosinophilic), stricture (which was dilated) and cervical web. (Dr. Benson Norway, EGD 2016)    Past Medical History:  Diagnosis Date  . Arthritis    "knees" (02/05/2016)  . Cancer ([place]) 5/10   [facility]   . Coronary artery disease   . GERD (gastroesophageal reflux disease)    tx meds  . H/O hiatal hernia   . Heart palpitations    monitor 04/19/12-05/10/12- frequent PAF with RVR- coumadin started  . History of blood transfusion "several"   last april 2015 s/p knee surgery (02/05/2016)  . Hypercholesteremia   . Hypertension   . Lung cancer ([place])    2010, surgery 18% left lung no radiation or chemo  . Non-STEMI (non-ST elevated myocardial infarction) ([place])  04/07/2012   See cath results below  . Paroxysmal atrial fibrillation ([place]) 04/07/2012   On Warfarin  . Peripheral vascular disease ([place]) 8/12   Lt SFA PTA  . Presence of stent in LAD coronary artery 04/07/2012   Xience Expedition DES 2.75 mm x 18 mm (dilated to 3.0 mm)  . Sleep apnea   . Type II diabetes mellitus (HCC)    insulin dependent    Past Surgical History:  Procedure Laterality Date  . ABDOMINAL HYSTERECTOMY  1990    . CARDIAC CATHETERIZATION  11/04/2008   patent RCA, LM, and Circ, nl EF  . CARDIAC CATHETERIZATION  02/20/2002   patent coronaries with the only abnormality being a smooth luminla irregularity in the mid intermediate ramus branch no felt to be hemodynamically significant, nl LV  . CARDIAC CATHETERIZATION N/A 02/05/2016   Procedure: Left Heart Cath and Coronary Angiography;  Surgeon: Leonie Man, MD;  Location: [place] [facility];  Service: Cardiovascular;  Laterality: N/A;  . CARDIAC CATHETERIZATION N/A 02/05/2016   Procedure: Coronary Stent Intervention;  Surgeon: Leonie Man, MD;  Location: [place] [facility];  Service: Cardiovascular;  Laterality: N/A;  . CATARACT EXTRACTION W/ INTRAOCULAR LENS  IMPLANT, BILATERAL  2013  . CORONARY ANGIOPLASTY WITH STENT PLACEMENT  04/07/2012   Xience Expedition DES 2.62mm x 18 mm (dilated to 3.0 mm) to the prox LAD   . CORONARY ANGIOPLASTY WITH STENT PLACEMENT  2013; 2015  . DILATION AND CURETTAGE OF UTERUS  <1990  . ESOPHAGOGASTRODUODENOSCOPY N/A 05/23/2015   Procedure: ESOPHAGOGASTRODUODENOSCOPY (EGD);  Surgeon: Carol Ada, MD;  Location: [facility];  Service: Endoscopy;  Laterality: N/A;  . ESOPHAGOGASTRODUODENOSCOPY (EGD) WITH PROPOFOL N/A 06/13/2015   Procedure: ESOPHAGOGASTRODUODENOSCOPY (EGD) WITH PROPOFOL;  Surgeon: Carol Ada, MD;  Location: [facility];  Service: Endoscopy;  Laterality: N/A;  . FRACTURE SURGERY    . HAMMER TOE SURGERY Bilateral 1993   with screws, on screw removed  . JOINT REPLACEMENT    . KNEE ARTHROSCOPY Bilateral    left x2, right x1  . LAPAROSCOPIC CHOLECYSTECTOMY    . LEFT HEART CATHETERIZATION WITH CORONARY ANGIOGRAM N/A 04/07/2012   Procedure: LEFT HEART CATHETERIZATION WITH CORONARY ANGIOGRAM;  Surgeon: [name], MD;  Location: [facility];  Service: Cardiovascular;  Laterality: N/A;  . LEFT HEART CATHETERIZATION WITH CORONARY ANGIOGRAM N/A 05/27/2014   Procedure: LEFT HEART CATHETERIZATION WITH  CORONARY ANGIOGRAM;  Surgeon: [name], MD; LAD 99% ISR, CFX 50-60%, RCA (dominant) no sig dz, EF 60%  . LOWER EXTREMITY ANGIOGRAM  05/17/11   directional atherectomy to the prox L SFA using a LX Man TurboHawk, ballooned with a [employer] balloon   . ORIF ANKLE FRACTURE  07/09/2012   Procedure: OPEN REDUCTION INTERNAL FIXATION (ORIF) ANKLE FRACTURE;  Surgeon: Meredith Pel, MD;  Location: [facility];  Service: Orthopedics;  Laterality: Left;  open reduction internal fixation trimalleolar ankle fracture medial malleolous fixation  . PERCUTANEOUS CORONARY STENT INTERVENTION (PCI-S) N/A 04/07/2012   Procedure: PERCUTANEOUS CORONARY STENT INTERVENTION (PCI-S);  Surgeon: [name], MD;  Location: [facility] CATH [facility];  Service: Cardiovascular;  Laterality: N/A;  . PERCUTANEOUS CORONARY STENT INTERVENTION (PCI-S)  05/27/2014   Procedure: PERCUTANEOUS CORONARY STENT INTERVENTION (PCI-S);  Surgeon: [name], MD; 3 mm x 12 mm long Xience Xpedition DES to the proximal LAD  . SAVORY DILATION N/A 06/13/2015   Procedure: SAVORY DILATION;  Surgeon: Carol Ada, MD;  Location: [facility];  Service: Endoscopy;  Laterality: N/A;  .  TONSILLECTOMY    . TOTAL KNEE ARTHROPLASTY Left 2003  . TOTAL KNEE REVISION Left 11/05/2013   Procedure: LEFT TOTAL KNEE RESECTION;  Surgeon: Mauri Pole, MD;  Location: [facility];  Service: Orthopedics;  Laterality: Left;  . TOTAL KNEE REVISION Left 2007   opened in 2006 and cleaned out, 2007 revision  . TOTAL KNEE REVISION Left 12/31/2013   Procedure: RE-INPLANTATION LEFT TOTAL KNEE ;  Surgeon: Mauri Pole, MD;  Location: [facility];  Service: Orthopedics;  Laterality: Left;  Marland Kitchen VIDEO ASSISTED THORACOSCOPY (VATS)/ LOBECTOMY  01/2009    Current Medications: Outpatient Medications Prior to Visit  Medication Sig Dispense Refill  . cholecalciferol (VITAMIN D) 1000 UNITS tablet Take 1,000 Units by mouth at bedtime.     . clopidogrel (PLAVIX) 75 MG tablet TAKE 1 TABLET BY MOUTH  DAILY 90 tablet 0  . glimepiride (AMARYL) 2 MG tablet Take 1 tablet by mouth daily.     . insulin NPH-regular Human (NOVOLIN 70/30) (70-30) 100 UNIT/ML injection Inject 30-34 Units into the skin 2 (two) times daily with a meal. Take 34 units in the morning 30 units in the evening    . lisinopril-hydrochlorothiazide (PRINZIDE,ZESTORETIC) 20-25 MG per tablet Take 1 tablet by mouth daily.    . metoCLOPramide (REGLAN) 10 MG tablet Take 10 mg by mouth 2 (two) times daily.     . metoprolol tartrate (LOPRESSOR) 25 MG tablet TAKE 1 TABLET BY MOUTH TWICE A DAY 60 tablet 7  . pantoprazole (PROTONIX) 40 MG tablet Take 1 tablet (40 mg total) by mouth daily. 90 tablet 3  . simvastatin (ZOCOR) 40 MG tablet TAKE 1 TABLET BY MOUTH AT BEDTIME. 90 tablet 2  . warfarin (COUMADIN) 7.5 MG tablet Take 1/2 to 1 tablet daily as directed by coumadin clinic 90 tablet 0   No facility-administered medications prior to visit.      Allergies:   Levofloxacin and Morphine and related   Social History   Social History  . Marital status: Single    Spouse name: N/A  . Number of children: N/A  . Years of education: N/A   Occupational History  . Retired    Social History Main Topics  . Smoking status: Former Smoker    Packs/day: 1.00    Years: 20.00    Types: Cigarettes    Quit date: 09/27/1984  . Smokeless tobacco: Never Used  . Alcohol use No  . Drug use: No  . Sexual activity: No   Other Topics Concern  . Not on file   Social History Narrative   Patient lives alone.     Family History:  The patient's family history includes Coronary artery disease in her brother; Hypertension in her mother and sister.   ROS:   Please see the history of present illness.    ROS All other systems reviewed and are negative.   PHYSICAL EXAM:   VS:  BP 110/80   Pulse 68   Ht 5\' 3"  (1.6 m)   Wt 269 lb (122 kg)   BMI 47.65 kg/m    GEN: Well nourished, well developed, in no acute distress  HEENT: normal  Neck: no JVD,  carotid bruits, or masses Cardiac: RRR; no murmurs, rubs, or gallops,no edema  Respiratory:  clear to auscultation bilaterally, normal work of breathing GI: soft, nontender, nondistended, + BS MS: no deformity or atrophy  Skin: warm and dry, no rash Neuro:  Alert and Oriented x 3, Strength and sensation are intact Psych: euthymic  mood, full affect  Wt Readings from Last 3 Encounters:  02/22/17 269 lb (122 kg)  05/28/16 258 lb 1.6 oz (117.1 kg)  02/26/16 258 lb (117 kg)      Studies/Labs Reviewed:   EKG:  EKG is ordered today. It shows sinus rhythm with right bundle branch block and no evidence of acute ischemic repolarization abnormalities. QTC 467 ms  Recent Labs: 10/19/2016 Potassium 3.9, creatinine 1.0, normal LFTs, normal TSH, hemoglobin 10.6 (02/06/2016  Lipid Panel    Component Value Date/Time   CHOL 119 01/06/2016 0455   TRIG 90 01/06/2016 0455   HDL 43 01/06/2016 0455   CHOLHDL 2.8 01/06/2016 0455   VLDL 18 01/06/2016 0455   LDLCALC 58 01/06/2016 0455   LDLDIRECT 143 (H) 10/10/2008 2042   10/19/2016 LDL 64, HDL 49, total cholesterol 130, triglycerides 83   ASSESSMENT:    1. Coronary artery disease of native artery of native heart with stable angina pectoris ([place])   2. Chronic combined systolic and diastolic heart failure ([place])   3. Essential hypertension   4. DM (diabetes mellitus), type 2 with peripheral vascular complications ([place])   5. Dyslipidemia   6. Morbid obesity (HCC)   7. Paroxysmal atrial fibrillation   8. PVD (peripheral vascular disease), Lt SFA PTA Aug 2012      PLAN:  In order of problems listed above:  1. CAD: asymptomatic now. She has had 3 separate percutaneous interventions the proximal LAD, twice complicated by restenosis. The likelihood of another episode of restenosis is high and she understands that if this occurs she will be best suited for bypass surgery with single vessel LIMA to LAD. It is possible that this can be performed  without cardiac pulmonary bypass. Interestingly, her previous pattern of restenosis has been a little delayed, occurring 18-24 months after the index procedure. Plan to continue clopidogrel indefinitely 2. CHF: Appears euvolemic. Limited by knee problems, not dyspnea. 3. HTN: well controlled 4. DM, poorly controlled:  A1c 7.7% , which is an improvement, but not quite at target yet 5. HLP:  good lipid profile. Continue statin 6. Obesity: discussed calorie restriction, avoiding carbs with high glycemic index. 7. PAF: no clinical events; on warfarin without bleeding complications, INR therapeutic without interruption since October 2017, in range over 90% of the time.. CHADSVasc 6 (age, gender, DM, CHF, CAD/PAD, HTN). 8. PAD: No clear evidence of intermittent claudication, although ischemia may be masked by her sedentary lifestyle.    Medication Adjustments/Labs and Tests Ordered: Current medicines are reviewed at length with the patient today.  Concerns regarding medicines are outlined above.  Medication changes, Labs and Tests ordered today are listed in the Patient Instructions below. Patient Instructions  Dr Sallyanne Kuster recommends that you schedule a follow-up appointment in 12 months. You will receive a reminder letter in the mail two months in advance. If you don't receive a letter, please call our office to schedule the follow-up appointment.  If you need a refill on your cardiac medications before your next appointment, please call your pharmacy.    Signed, Sanda Klein, MD  02/22/2017 9:07 AM    [facility] [place], [place],   [address] Phone: [phone]; Fax: [phone]

## 2017-02-22 ENCOUNTER — Encounter: Payer: Self-pay | Admitting: Cardiovascular Disease

## 2017-02-22 ENCOUNTER — Ambulatory Visit (INDEPENDENT_AMBULATORY_CARE_PROVIDER_SITE_OTHER): Payer: Medicare HMO | Admitting: Cardiovascular Disease

## 2017-02-22 VITALS — BP 110/80 | HR 68 | Ht 63.0 in | Wt 269.0 lb

## 2017-02-22 DIAGNOSIS — I739 Peripheral vascular disease, unspecified: Secondary | ICD-10-CM

## 2017-02-22 DIAGNOSIS — I25118 Atherosclerotic heart disease of native coronary artery with other forms of angina pectoris: Secondary | ICD-10-CM | POA: Diagnosis not present

## 2017-02-22 DIAGNOSIS — I2510 Atherosclerotic heart disease of native coronary artery without angina pectoris: Secondary | ICD-10-CM | POA: Diagnosis not present

## 2017-02-22 DIAGNOSIS — I10 Essential (primary) hypertension: Secondary | ICD-10-CM | POA: Diagnosis not present

## 2017-02-22 DIAGNOSIS — I5042 Chronic combined systolic (congestive) and diastolic (congestive) heart failure: Secondary | ICD-10-CM | POA: Diagnosis not present

## 2017-02-22 DIAGNOSIS — I480 Paroxysmal atrial fibrillation: Secondary | ICD-10-CM

## 2017-02-22 DIAGNOSIS — I482 Chronic atrial fibrillation: Secondary | ICD-10-CM | POA: Diagnosis not present

## 2017-02-22 DIAGNOSIS — E1151 Type 2 diabetes mellitus with diabetic peripheral angiopathy without gangrene: Secondary | ICD-10-CM

## 2017-02-22 DIAGNOSIS — I700 Atherosclerosis of aorta: Secondary | ICD-10-CM | POA: Diagnosis not present

## 2017-02-22 DIAGNOSIS — E785 Hyperlipidemia, unspecified: Secondary | ICD-10-CM | POA: Diagnosis not present

## 2017-02-22 DIAGNOSIS — E782 Mixed hyperlipidemia: Secondary | ICD-10-CM | POA: Diagnosis not present

## 2017-02-22 DIAGNOSIS — E1065 Type 1 diabetes mellitus with hyperglycemia: Secondary | ICD-10-CM | POA: Diagnosis not present

## 2017-02-22 DIAGNOSIS — Z85118 Personal history of other malignant neoplasm of bronchus and lung: Secondary | ICD-10-CM | POA: Diagnosis not present

## 2017-02-22 NOTE — Patient Instructions (Signed)
Dr Croitoru recommends that you schedule a follow-up appointment in 12 months. You will receive a reminder letter in the mail two months in advance. If you don't receive a letter, please call our office to schedule the follow-up appointment.  If you need a refill on your cardiac medications before your next appointment, please call your pharmacy. 

## 2017-02-24 ENCOUNTER — Other Ambulatory Visit: Payer: Self-pay | Admitting: Cardiovascular Disease

## 2017-02-24 NOTE — Telephone Encounter (Signed)
REFILL 

## 2017-03-01 DIAGNOSIS — E1065 Type 1 diabetes mellitus with hyperglycemia: Secondary | ICD-10-CM | POA: Diagnosis not present

## 2017-03-10 ENCOUNTER — Other Ambulatory Visit: Payer: Self-pay | Admitting: Internal Medicine

## 2017-03-10 DIAGNOSIS — Z1231 Encounter for screening mammogram for malignant neoplasm of breast: Secondary | ICD-10-CM

## 2017-03-11 DIAGNOSIS — E1065 Type 1 diabetes mellitus with hyperglycemia: Secondary | ICD-10-CM | POA: Diagnosis not present

## 2017-03-29 ENCOUNTER — Ambulatory Visit (INDEPENDENT_AMBULATORY_CARE_PROVIDER_SITE_OTHER): Payer: Medicare HMO | Admitting: Pharmacist

## 2017-03-29 DIAGNOSIS — Z7901 Long term (current) use of anticoagulants: Secondary | ICD-10-CM | POA: Diagnosis not present

## 2017-03-29 DIAGNOSIS — I480 Paroxysmal atrial fibrillation: Secondary | ICD-10-CM

## 2017-03-29 LAB — POCT INR: INR: 2.4

## 2017-04-20 ENCOUNTER — Ambulatory Visit
Admission: RE | Admit: 2017-04-20 | Discharge: 2017-04-20 | Disposition: A | Discharge status: Home / Self Care | Payer: Medicare HMO | Source: Ambulatory Visit | Attending: Internal Medicine | Admitting: Internal Medicine

## 2017-04-20 DIAGNOSIS — Z1231 Encounter for screening mammogram for malignant neoplasm of breast: Secondary | ICD-10-CM | POA: Diagnosis not present

## 2017-05-02 DIAGNOSIS — E113493 Type 2 diabetes mellitus with severe nonproliferative diabetic retinopathy without macular edema, bilateral: Secondary | ICD-10-CM | POA: Diagnosis not present

## 2017-05-05 ENCOUNTER — Other Ambulatory Visit: Payer: Self-pay | Admitting: Cardiovascular Disease

## 2017-05-08 ENCOUNTER — Other Ambulatory Visit: Payer: Self-pay | Admitting: Cardiovascular Disease

## 2017-05-22 ENCOUNTER — Other Ambulatory Visit: Payer: Self-pay | Admitting: Cardiovascular Disease

## 2017-06-10 DIAGNOSIS — E1065 Type 1 diabetes mellitus with hyperglycemia: Secondary | ICD-10-CM | POA: Diagnosis not present

## 2017-06-15 DIAGNOSIS — E1065 Type 1 diabetes mellitus with hyperglycemia: Secondary | ICD-10-CM | POA: Diagnosis not present

## 2017-06-15 DIAGNOSIS — I482 Chronic atrial fibrillation: Secondary | ICD-10-CM | POA: Diagnosis not present

## 2017-06-29 ENCOUNTER — Ambulatory Visit (INDEPENDENT_AMBULATORY_CARE_PROVIDER_SITE_OTHER): Payer: Medicare HMO | Admitting: Pharmacist

## 2017-06-29 ENCOUNTER — Ambulatory Visit (INDEPENDENT_AMBULATORY_CARE_PROVIDER_SITE_OTHER): Payer: Medicare HMO | Admitting: Podiatry

## 2017-06-29 ENCOUNTER — Encounter: Payer: Self-pay | Admitting: Podiatry

## 2017-06-29 DIAGNOSIS — E1065 Type 1 diabetes mellitus with hyperglycemia: Secondary | ICD-10-CM | POA: Diagnosis not present

## 2017-06-29 DIAGNOSIS — N183 Chronic kidney disease, stage 3 (moderate): Secondary | ICD-10-CM | POA: Diagnosis not present

## 2017-06-29 DIAGNOSIS — L600 Ingrowing nail: Secondary | ICD-10-CM

## 2017-06-29 DIAGNOSIS — B351 Tinea unguium: Secondary | ICD-10-CM

## 2017-06-29 DIAGNOSIS — M79672 Pain in left foot: Secondary | ICD-10-CM | POA: Diagnosis not present

## 2017-06-29 DIAGNOSIS — Z7901 Long term (current) use of anticoagulants: Secondary | ICD-10-CM | POA: Diagnosis not present

## 2017-06-29 DIAGNOSIS — I129 Hypertensive chronic kidney disease with stage 1 through stage 4 chronic kidney disease, or unspecified chronic kidney disease: Secondary | ICD-10-CM | POA: Diagnosis not present

## 2017-06-29 DIAGNOSIS — M79671 Pain in right foot: Secondary | ICD-10-CM

## 2017-06-29 DIAGNOSIS — I480 Paroxysmal atrial fibrillation: Secondary | ICD-10-CM

## 2017-06-29 DIAGNOSIS — E1059 Type 1 diabetes mellitus with other circulatory complications: Secondary | ICD-10-CM | POA: Diagnosis not present

## 2017-06-29 LAB — POCT INR: INR: 2.7

## 2017-06-29 NOTE — Progress Notes (Signed)
SUBJECTIVE: 66 y.o. year old female presents complaining of painful toes from thick and ingrown nails. Her last visit to this office was January 2015 for routine foot care.  HPI: Stated that left knee surgery resulted in infection, had stent put in twice, and had to be in nursing home for 4 months. Last A1c yesterday was 8.5.  REVIEW OF SYSTEMS: Constitutional: negative for chills, fatigue, fevers, night sweats and weight loss Ears, nose, mouth, throat, and face: negative Respiratory: negative Cardiovascular: negative, Recent history of stent put in twice. Musculoskeletal:Stiffness of left knee following the knee surgery and walks with cane.  OBJECTIVE: DERMATOLOGIC EXAMINATION: Nails: Thick dystrophic nails x 10. Ingrown hallucal nails both great toe nails without infection.   VASCULAR EXAMINATION OF LOWER LIMBS: All pedal pulses are palpable with normal pulsation.  No edema or erythema noted. Temperature gradient from tibial crest to dorsum of foot is within normal bilateral.  NEUROLOGIC EXAMINATION OF THE LOWER LIMBS: All epicritic and tactile sensations grossly intact. Sharp and Dull discriminatory sensations at the plantar ball of hallux is intact bilateral.   MUSCULOSKELETAL EXAMINATION: No gross deformities.  ASSESSMENT: Ingrown hallucal nails symptomatic. Mycotic nails x 10. Pain with ambulation. Diabetic not controlled.  PLAN: Reviewed findings and available treatment options. All nails debrided. Return in 3 months or sooner if needed.

## 2017-06-29 NOTE — Patient Instructions (Signed)
Seen for hypertrophic nails. All nails debrided. Return in 3 months or as needed.  

## 2017-08-02 DIAGNOSIS — Z23 Encounter for immunization: Secondary | ICD-10-CM | POA: Diagnosis not present

## 2017-08-09 ENCOUNTER — Encounter (HOSPITAL_COMMUNITY): Payer: Self-pay

## 2017-08-09 ENCOUNTER — Other Ambulatory Visit: Payer: Self-pay

## 2017-08-09 ENCOUNTER — Inpatient Hospital Stay (HOSPITAL_COMMUNITY)
Admission: EM | Admit: 2017-08-09 | Discharge: 2017-08-23 | DRG: 234 | Disposition: A | Discharge status: Skilled Nursing Facility | Payer: Medicare HMO | Attending: Thoracic Surgery (Cardiothoracic Vascular Surgery) | Admitting: Thoracic Surgery (Cardiothoracic Vascular Surgery)

## 2017-08-09 ENCOUNTER — Emergency Department (HOSPITAL_COMMUNITY): Payer: Medicare HMO

## 2017-08-09 DIAGNOSIS — E871 Hypo-osmolality and hyponatremia: Secondary | ICD-10-CM | POA: Diagnosis present

## 2017-08-09 DIAGNOSIS — I259 Chronic ischemic heart disease, unspecified: Secondary | ICD-10-CM | POA: Diagnosis not present

## 2017-08-09 DIAGNOSIS — I10 Essential (primary) hypertension: Secondary | ICD-10-CM | POA: Diagnosis present

## 2017-08-09 DIAGNOSIS — T148XXA Other injury of unspecified body region, initial encounter: Secondary | ICD-10-CM

## 2017-08-09 DIAGNOSIS — E1122 Type 2 diabetes mellitus with diabetic chronic kidney disease: Secondary | ICD-10-CM | POA: Diagnosis present

## 2017-08-09 DIAGNOSIS — Z961 Presence of intraocular lens: Secondary | ICD-10-CM | POA: Diagnosis present

## 2017-08-09 DIAGNOSIS — E1151 Type 2 diabetes mellitus with diabetic peripheral angiopathy without gangrene: Secondary | ICD-10-CM | POA: Diagnosis present

## 2017-08-09 DIAGNOSIS — I454 Nonspecific intraventricular block: Secondary | ICD-10-CM | POA: Diagnosis present

## 2017-08-09 DIAGNOSIS — Z85118 Personal history of other malignant neoplasm of bronchus and lung: Secondary | ICD-10-CM

## 2017-08-09 DIAGNOSIS — N183 Chronic kidney disease, stage 3 unspecified: Secondary | ICD-10-CM | POA: Diagnosis present

## 2017-08-09 DIAGNOSIS — I208 Other forms of angina pectoris: Secondary | ICD-10-CM | POA: Diagnosis not present

## 2017-08-09 DIAGNOSIS — Z6841 Body Mass Index (BMI) 40.0 and over, adult: Secondary | ICD-10-CM

## 2017-08-09 DIAGNOSIS — Z780 Asymptomatic menopausal state: Secondary | ICD-10-CM

## 2017-08-09 DIAGNOSIS — Z79899 Other long term (current) drug therapy: Secondary | ICD-10-CM

## 2017-08-09 DIAGNOSIS — R41841 Cognitive communication deficit: Secondary | ICD-10-CM | POA: Diagnosis not present

## 2017-08-09 DIAGNOSIS — R918 Other nonspecific abnormal finding of lung field: Secondary | ICD-10-CM | POA: Diagnosis not present

## 2017-08-09 DIAGNOSIS — J9811 Atelectasis: Secondary | ICD-10-CM | POA: Diagnosis not present

## 2017-08-09 DIAGNOSIS — Y831 Surgical operation with implant of artificial internal device as the cause of abnormal reaction of the patient, or of later complication, without mention of misadventure at the time of the procedure: Secondary | ICD-10-CM | POA: Diagnosis present

## 2017-08-09 DIAGNOSIS — I130 Hypertensive heart and chronic kidney disease with heart failure and stage 1 through stage 4 chronic kidney disease, or unspecified chronic kidney disease: Secondary | ICD-10-CM | POA: Diagnosis present

## 2017-08-09 DIAGNOSIS — E785 Hyperlipidemia, unspecified: Secondary | ICD-10-CM | POA: Diagnosis present

## 2017-08-09 DIAGNOSIS — I2510 Atherosclerotic heart disease of native coronary artery without angina pectoris: Secondary | ICD-10-CM

## 2017-08-09 DIAGNOSIS — E1143 Type 2 diabetes mellitus with diabetic autonomic (poly)neuropathy: Secondary | ICD-10-CM | POA: Diagnosis present

## 2017-08-09 DIAGNOSIS — R002 Palpitations: Secondary | ICD-10-CM | POA: Diagnosis present

## 2017-08-09 DIAGNOSIS — Z951 Presence of aortocoronary bypass graft: Secondary | ICD-10-CM | POA: Diagnosis not present

## 2017-08-09 DIAGNOSIS — Y840 Cardiac catheterization as the cause of abnormal reaction of the patient, or of later complication, without mention of misadventure at the time of the procedure: Secondary | ICD-10-CM | POA: Diagnosis not present

## 2017-08-09 DIAGNOSIS — D62 Acute posthemorrhagic anemia: Secondary | ICD-10-CM | POA: Diagnosis not present

## 2017-08-09 DIAGNOSIS — S299XXA Unspecified injury of thorax, initial encounter: Secondary | ICD-10-CM | POA: Diagnosis not present

## 2017-08-09 DIAGNOSIS — R079 Chest pain, unspecified: Secondary | ICD-10-CM | POA: Diagnosis not present

## 2017-08-09 DIAGNOSIS — E8881 Metabolic syndrome: Secondary | ICD-10-CM | POA: Diagnosis present

## 2017-08-09 DIAGNOSIS — K3184 Gastroparesis: Secondary | ICD-10-CM | POA: Diagnosis present

## 2017-08-09 DIAGNOSIS — R072 Precordial pain: Secondary | ICD-10-CM

## 2017-08-09 DIAGNOSIS — Z4682 Encounter for fitting and adjustment of non-vascular catheter: Secondary | ICD-10-CM | POA: Diagnosis not present

## 2017-08-09 DIAGNOSIS — I088 Other rheumatic multiple valve diseases: Secondary | ICD-10-CM | POA: Diagnosis not present

## 2017-08-09 DIAGNOSIS — I25118 Atherosclerotic heart disease of native coronary artery with other forms of angina pectoris: Secondary | ICD-10-CM

## 2017-08-09 DIAGNOSIS — T82855A Stenosis of coronary artery stent, initial encounter: Secondary | ICD-10-CM | POA: Diagnosis present

## 2017-08-09 DIAGNOSIS — I5042 Chronic combined systolic (congestive) and diastolic (congestive) heart failure: Secondary | ICD-10-CM | POA: Diagnosis present

## 2017-08-09 DIAGNOSIS — Z881 Allergy status to other antibiotic agents status: Secondary | ICD-10-CM

## 2017-08-09 DIAGNOSIS — Z87891 Personal history of nicotine dependence: Secondary | ICD-10-CM

## 2017-08-09 DIAGNOSIS — R000 Tachycardia, unspecified: Secondary | ICD-10-CM | POA: Diagnosis present

## 2017-08-09 DIAGNOSIS — R262 Difficulty in walking, not elsewhere classified: Secondary | ICD-10-CM | POA: Diagnosis not present

## 2017-08-09 DIAGNOSIS — Z48812 Encounter for surgical aftercare following surgery on the circulatory system: Secondary | ICD-10-CM | POA: Diagnosis not present

## 2017-08-09 DIAGNOSIS — I25110 Atherosclerotic heart disease of native coronary artery with unstable angina pectoris: Secondary | ICD-10-CM | POA: Diagnosis present

## 2017-08-09 DIAGNOSIS — Z9841 Cataract extraction status, right eye: Secondary | ICD-10-CM

## 2017-08-09 DIAGNOSIS — I200 Unstable angina: Secondary | ICD-10-CM

## 2017-08-09 DIAGNOSIS — N3281 Overactive bladder: Secondary | ICD-10-CM | POA: Diagnosis present

## 2017-08-09 DIAGNOSIS — Z7902 Long term (current) use of antithrombotics/antiplatelets: Secondary | ICD-10-CM

## 2017-08-09 DIAGNOSIS — I480 Paroxysmal atrial fibrillation: Secondary | ICD-10-CM | POA: Diagnosis present

## 2017-08-09 DIAGNOSIS — Z8249 Family history of ischemic heart disease and other diseases of the circulatory system: Secondary | ICD-10-CM

## 2017-08-09 DIAGNOSIS — R0789 Other chest pain: Secondary | ICD-10-CM | POA: Diagnosis present

## 2017-08-09 DIAGNOSIS — M1712 Unilateral primary osteoarthritis, left knee: Secondary | ICD-10-CM | POA: Diagnosis present

## 2017-08-09 DIAGNOSIS — M6281 Muscle weakness (generalized): Secondary | ICD-10-CM | POA: Diagnosis not present

## 2017-08-09 DIAGNOSIS — Z794 Long term (current) use of insulin: Secondary | ICD-10-CM

## 2017-08-09 DIAGNOSIS — E1169 Type 2 diabetes mellitus with other specified complication: Secondary | ICD-10-CM | POA: Diagnosis present

## 2017-08-09 DIAGNOSIS — K219 Gastro-esophageal reflux disease without esophagitis: Secondary | ICD-10-CM | POA: Diagnosis present

## 2017-08-09 DIAGNOSIS — Z90710 Acquired absence of both cervix and uterus: Secondary | ICD-10-CM

## 2017-08-09 DIAGNOSIS — Z7901 Long term (current) use of anticoagulants: Secondary | ICD-10-CM | POA: Diagnosis not present

## 2017-08-09 DIAGNOSIS — R600 Localized edema: Secondary | ICD-10-CM | POA: Diagnosis not present

## 2017-08-09 DIAGNOSIS — E8770 Fluid overload, unspecified: Secondary | ICD-10-CM | POA: Diagnosis not present

## 2017-08-09 DIAGNOSIS — I252 Old myocardial infarction: Secondary | ICD-10-CM

## 2017-08-09 DIAGNOSIS — G4733 Obstructive sleep apnea (adult) (pediatric): Secondary | ICD-10-CM | POA: Diagnosis present

## 2017-08-09 DIAGNOSIS — Z885 Allergy status to narcotic agent status: Secondary | ICD-10-CM

## 2017-08-09 DIAGNOSIS — R791 Abnormal coagulation profile: Secondary | ICD-10-CM | POA: Diagnosis present

## 2017-08-09 DIAGNOSIS — Z01810 Encounter for preprocedural cardiovascular examination: Secondary | ICD-10-CM | POA: Diagnosis not present

## 2017-08-09 DIAGNOSIS — Z96652 Presence of left artificial knee joint: Secondary | ICD-10-CM | POA: Diagnosis present

## 2017-08-09 DIAGNOSIS — Z9842 Cataract extraction status, left eye: Secondary | ICD-10-CM

## 2017-08-09 DIAGNOSIS — E119 Type 2 diabetes mellitus without complications: Secondary | ICD-10-CM | POA: Diagnosis not present

## 2017-08-09 LAB — CBC
HEMATOCRIT: 38.3 % (ref 36.0–46.0)
HEMOGLOBIN: 12.7 g/dL (ref 12.0–15.0)
MCH: 28.7 pg (ref 26.0–34.0)
MCHC: 33.2 g/dL (ref 30.0–36.0)
MCV: 86.5 fL (ref 78.0–100.0)
Platelets: 275 10*3/uL (ref 150–400)
RBC: 4.43 MIL/uL (ref 3.87–5.11)
RDW: 13.3 % (ref 11.5–15.5)
WBC: 8.4 10*3/uL (ref 4.0–10.5)

## 2017-08-09 LAB — BASIC METABOLIC PANEL
Anion gap: 7 (ref 5–15)
BUN: 16 mg/dL (ref 6–20)
CHLORIDE: 99 mmol/L — AB (ref 101–111)
CO2: 27 mmol/L (ref 22–32)
Calcium: 9.6 mg/dL (ref 8.9–10.3)
Creatinine, Ser: 1.1 mg/dL — ABNORMAL HIGH (ref 0.44–1.00)
GFR calc Af Amer: 59 mL/min — ABNORMAL LOW (ref >60)
GFR, EST NON AFRICAN AMERICAN: 51 mL/min — AB (ref >60)
GLUCOSE: 256 mg/dL — AB (ref 65–99)
POTASSIUM: 4.4 mmol/L (ref 3.5–5.1)
SODIUM: 133 mmol/L — AB (ref 135–145)

## 2017-08-09 LAB — I-STAT TROPONIN, ED: Troponin i, poc: 0 ng/mL (ref 0.00–0.08)

## 2017-08-09 LAB — CBG MONITORING, ED: GLUCOSE-CAPILLARY: 169 mg/dL — AB (ref 65–99)

## 2017-08-09 LAB — PROTIME-INR
INR: 2.38
PROTHROMBIN TIME: 25.8 s — AB (ref 11.4–15.2)

## 2017-08-09 MED ORDER — ASPIRIN 81 MG PO CHEW
324.0000 mg | CHEWABLE_TABLET | Freq: Once | ORAL | Status: AC
  Administered 2017-08-09: 324 mg via ORAL
  Filled 2017-08-09: qty 4

## 2017-08-09 MED ORDER — NITROGLYCERIN 0.4 MG SL SUBL
0.4000 mg | SUBLINGUAL_TABLET | Freq: Once | SUBLINGUAL | Status: AC
  Administered 2017-08-09: 0.4 mg via SUBLINGUAL
  Filled 2017-08-09: qty 1

## 2017-08-09 NOTE — ED Provider Notes (Signed)
[facility] EMERGENCY DEPARTMENT Provider Note   CSN: [id number] Arrival date & time: 08/09/17  1315     History   Chief Complaint Chief Complaint  Patient presents with  . Chest Pain    HPI [name] is a 66 y.o. female.  HPI 66 year old female who presents with chest pain.  She has a history of coronary artery disease status post stenting complicated by 2 episodes of in-stent stenosis last in May 2017.  She is on lifelong Plavix and Coumadin therapy.  She also has a history of paroxysmal atrial fibrillation, diabetes, retention, hyperlipidemia and obesity.  States onset of chest pain while at lying down at 11:30 AM.  States that it felt like an indigestion pain, that is very hard to describe.  States that it was in the center of her chest, very nagging in nature.  Associated with mild nausea but denies vomiting, shortness of breath, dizziness, diaphoresis, abdominal pain or diarrhea.  States that this feels similar to when she has had her re-in-stent stenosis in the past.  Pain not changed with activity, eating, or change in positions.  No other alleviating or aggravating factors. Past Medical History:  Diagnosis Date  . Arthritis    "knees" (02/05/2016)  . Cancer ([place]) 5/10   LUL Vats   . Coronary artery disease   . GERD (gastroesophageal reflux disease)    tx meds  . H/O hiatal hernia   . Heart palpitations    monitor 04/19/12-05/10/12- frequent PAF with RVR- coumadin started  . History of blood transfusion "several"   last april 2015 s/p knee surgery (02/05/2016)  . Hypercholesteremia   . Hypertension   . Lung cancer ([place])    2010, surgery 18% left lung no radiation or chemo  . Non-STEMI (non-ST elevated myocardial infarction) ([place]) 04/07/2012   See cath results below  . Paroxysmal atrial fibrillation ([place]) 04/07/2012   On Warfarin  . Peripheral vascular disease ([place]) 8/12   Lt SFA PTA  . Presence of stent in LAD coronary artery 04/07/2012   Xience Expedition DES 2.75 mm x 18 mm (dilated to 3.0 mm)  . Sleep apnea   . Type II diabetes mellitus (HCC)    insulin dependent    Patient Active Problem List   Diagnosis Date Noted  . Abnormal nuclear stress test 02/05/2016  . Chest pain with high risk for cardiac etiology 02/05/2016  . CAD S/P percutaneous coronary angioplasty 02/05/2016  . GERD (gastroesophageal reflux disease) 01/06/2016  . CKD (chronic kidney disease) stage 3, GFR 30-59 ml/min (HCC) 01/06/2016  . Chronic combined systolic and diastolic heart failure ([place]) 06/28/2015  . Esophageal reflux 02/15/2014  . Anemia 01/17/2014  . S/P left TK revision 12/31/2013  . Knee osteomyelitis, left ([employer]) 11/21/2013  . Foreign body of knee with infection 11/18/2013  . Unspecified constipation 11/07/2013  . Expected blood loss anemia 11/06/2013  . Left knee spacer, following removal of joint prosthesis 11/05/2013  . DM (diabetes mellitus), type 2 with peripheral vascular complications ([place]) 40/98/1191  . Onychomycosis 10/23/2013  . Pain, foot 10/23/2013  . DJD (degenerative joint disease) of knee 04/10/2013  . Vaginal atrophy 01/01/2013  . OAB (overactive bladder) 01/01/2013  . Osteopenia 01/01/2013  . Chest pain 11/28/2012  . Palpitations 11/22/2012  . Dyslipidemia 11/22/2012  . Chronic anticoagulation 11/22/2012  . Morbid obesity ([place]) 07/11/2012  . Trimalleolar fracture 07/07/2012  . CAD s/p LAD DES 2013, 2015 and 2017 04/08/2012  . ICM, EF 40-45% cath 04/07/12-  improved to 50-60% by echo Oct 2013 04/08/2012  . Paroxysmal atrial fibrillation 04/07/2012  . NSTEMI (non-ST elevated myocardial infarction)- 7/13 04/07/2012  . PVD (peripheral vascular disease), Lt SFA PTA Aug 2012 04/07/2012  . Cancer of left lung parenchyma ([place]) 01/13/2009  . Insulin dependent diabetes mellitus ([place]) 11/27/2008  . Essential hypertension 11/27/2008    Past Surgical History:  Procedure Laterality Date  . ABDOMINAL HYSTERECTOMY  1990  .  BREAST EXCISIONAL BIOPSY Right   . CARDIAC CATHETERIZATION  11/04/2008   patent RCA, LM, and Circ, nl EF  . CARDIAC CATHETERIZATION  02/20/2002   patent coronaries with the only abnormality being a smooth luminla irregularity in the mid intermediate ramus branch no felt to be hemodynamically significant, nl LV  . CATARACT EXTRACTION W/ INTRAOCULAR LENS  IMPLANT, BILATERAL  2013  . CORONARY ANGIOPLASTY WITH STENT PLACEMENT  04/07/2012   Xience Expedition DES 2.69mm x 18 mm (dilated to 3.0 mm) to the prox LAD   . CORONARY ANGIOPLASTY WITH STENT PLACEMENT  2013; 2015  . DILATION AND CURETTAGE OF UTERUS  <1990  . FRACTURE SURGERY    . HAMMER TOE SURGERY Bilateral 1993   with screws, on screw removed  . JOINT REPLACEMENT    . KNEE ARTHROSCOPY Bilateral    left x2, right x1  . LAPAROSCOPIC CHOLECYSTECTOMY    . LOWER EXTREMITY ANGIOGRAM  05/17/11   directional atherectomy to the prox L SFA using a LX Man TurboHawk, ballooned with a [employer] balloon   . TONSILLECTOMY    . TOTAL KNEE ARTHROPLASTY Left 2003  . TOTAL KNEE REVISION Left 2007   opened in 2006 and cleaned out, 2007 revision  . VIDEO ASSISTED THORACOSCOPY (VATS)/ LOBECTOMY  01/2009    OB History    Gravida Para Term Preterm AB Living   1 1       1    SAB TAB Ectopic Multiple Live Births                   Home Medications    Prior to Admission medications   Medication Sig Start Date End Date Taking? Authorizing Provider  cholecalciferol (VITAMIN D) 1000 UNITS tablet Take 1,000 Units by mouth at bedtime.    Yes [provider]  clopidogrel (PLAVIX) 75 MG tablet TAKE 1 TABLET BY MOUTH EVERY DAY Patient taking differently: TAKE 75 mg TABLET BY MOUTH EVERY DAY 02/24/17  Yes Croitoru, Mihai, MD  glimepiride (AMARYL) 2 MG tablet Take 2 mg daily by mouth.  12/23/15  Yes [provider]  insulin NPH-regular Human (NOVOLIN 70/30) (70-30) 100 UNIT/ML injection Inject 30-34 Units 2 (two) times daily with a meal into the  skin. Take 42 units in the morning 20 units in the evening   Yes [provider]  lisinopril-hydrochlorothiazide (PRINZIDE,ZESTORETIC) 20-25 MG per tablet Take 1 tablet by mouth daily.   Yes [provider]  metoprolol tartrate (LOPRESSOR) 25 MG tablet Take 1 tablet (25 mg total) by mouth 2 (two) times daily. 05/05/17  Yes Croitoru, Mihai, MD  pantoprazole (PROTONIX) 40 MG tablet Take 1 tablet (40 mg total) by mouth daily. 06/01/16  Yes Croitoru, Mihai, MD  simvastatin (ZOCOR) 40 MG tablet TAKE 1 TABLET BY MOUTH AT BEDTIME. Patient taking differently: TAKE 40 mg TABLET BY MOUTH AT BEDTIME. 02/18/17  Yes Croitoru, Mihai, MD  warfarin (COUMADIN) 7.5 MG tablet TAKE 1/2 TO 1 TABLET DAILY AS DIRECTED BY COUMADIN CLINIC Patient taking differently: Take 3.75 on monday, wednesday and  Friday. Take 7.5 on tuesday thursday saturday and Sunday 05/23/17  Yes Croitoru, Mihai, MD  metoCLOPramide (REGLAN) 10 MG tablet Take 10 mg by mouth 2 (two) times daily.     [provider]    Family History Family History  Problem Relation Age of Onset  . Hypertension Mother   . Coronary artery disease Brother   . Hypertension Sister     Social History Social History   Tobacco Use  . Smoking status: Former Smoker    Packs/day: 1.00    Years: 20.00    Pack years: 20.00    Types: Cigarettes    Last attempt to quit: 09/27/1984    Years since quitting: 32.8  . Smokeless tobacco: Never Used  Substance Use Topics  . Alcohol use: No  . Drug use: No     Allergies   Levofloxacin and Morphine and related   Review of Systems Review of Systems  Constitutional: Negative for fever.  Respiratory: Negative for cough.   Cardiovascular: Positive for chest pain.  Gastrointestinal: Negative for abdominal pain.  All other systems reviewed and are negative.    Physical Exam Updated Vital Signs BP (!) 128/56   Pulse 74   Temp 98.4 F (36.9 C) (Oral)   Resp (!) 23   SpO2 100%   Physical  Exam Physical Exam  Nursing note and vitals reviewed. Constitutional: Well developed, well nourished, non-toxic, and in no acute distress Head: Normocephalic and atraumatic.  Mouth/Throat: Oropharynx is clear and moist.  Neck: Normal range of motion. Neck supple.  Cardiovascular: Normal rate and regular rhythm.   Pulmonary/Chest: Effort normal and breath sounds normal.  Abdominal: Soft. There is no tenderness. There is no rebound and no guarding.  Musculoskeletal: Normal range of motion.  Neurological: Alert, no facial droop, fluent speech, moves all extremities symmetrically Skin: Skin is warm and dry.  Psychiatric: Cooperative   ED Treatments / Results  Labs (all labs ordered are listed, but only abnormal results are displayed) Labs Reviewed  BASIC METABOLIC PANEL - Abnormal; Notable for the following components:      Result Value   Sodium 133 (*)    Chloride 99 (*)    Glucose, Bld 256 (*)    Creatinine, Ser 1.10 (*)    GFR calc non Af Amer 51 (*)    GFR calc Af Amer 59 (*)    All other components within normal limits  PROTIME-INR - Abnormal; Notable for the following components:   Prothrombin Time 25.8 (*)    All other components within normal limits  CBC  I-STAT TROPONIN, ED    EKG  EKG Interpretation  Date/Time:  Tuesday August 09 2017 13:19:19 EST Ventricular Rate:  69 PR Interval:  158 QRS Duration: 162 QT Interval:  446 QTC Calculation: 477 R Axis:   -2 Text Interpretation:  Normal sinus rhythm Right bundle branch block Minimal voltage criteria for LVH, may be normal variant Abnormal ECG similar EKG to previous  Confirmed by Brantley Stage [id number]) on 08/09/2017 7:15:04 PM       Radiology Dg Chest 2 View  Result Date: 08/09/2017 CLINICAL DATA:  Chest pain EXAM: CHEST  2 VIEW COMPARISON:  01/05/2016 FINDINGS: Low lung volumes. Mild cardiomegaly with central congestion. Mild increased opacity at the left lung base may partially relate to overlying soft tissue.  Coronary stent. Aortic atherosclerosis. No pleural effusion. Surgical clips in the abdomen. IMPRESSION: 1. Low lung volumes with hazy opacity at the left lung base, likely augmented by  overlying soft tissue. 2. Cardiomegaly with mild central congestion. Electronically Signed   By: Donavan Foil M.D.   On: 08/09/2017 14:32    Procedures Procedures (including critical care time)  Medications Ordered in ED Medications  aspirin chewable tablet 324 mg (324 mg Oral Given 08/09/17 2039)  nitroGLYCERIN (NITROSTAT) SL tablet 0.4 mg (0.4 mg Sublingual Given 08/09/17 2039)     Initial Impression / Assessment and Plan / ED Course  I have reviewed the triage vital signs and the nursing notes.  Pertinent labs & imaging results that were available during my care of the patient were reviewed by me and considered in my medical decision making (see chart for details).       I reviewed the available records in [facility] and also Care Everywhere, updated the chart. Last cath 01/2016 report copy and pasted below:  Procedures    Coronary Stent Intervention   Left Heart Cath and Coronary Angiography    Conclusion    1. Ost - Proximal LAD lesion, 80% and 99% in-stent restenosis. Post intervention with scoring balloon angioplasty followed by a Synergy DES stent covering the entire stented segment., there is a 0% residual stenosis. The lesion was previously treated with a 2 overlapping DES stents in 2013 and 2015. 2. Prox Cx lesion, 55% stenosed. Stable 3. Ramus lesion, 20% stenosed. 4. The left ventricular systolic function is normal.   The patient has now had 2 episodes of in-stent restenosis in a DES stent. My recommendation would be to continue Plavix lifelong if possible. She is on Coumadin for Afib. Would be OK to stop ASA after ~1 month & continue Plavix.    Presents with indigestion and nonspecific chest pain.  However this is similar to when she has had her in-stent restenosis in the past.  Did  receive aspirin and nitroglycerin in ED, and states that chest pain has gone but still feels indigestion that she cannot explain.  EKG is not acutely ischemic.  Troponin x1 is normal.  She has a therapeutic INR.  Chest x-ray visualized and shows no acute cardiopulmonary processes.  Concern for underlying ACS given her symptoms and history.  Low suspicion for other serious causes such as dissection or PE.  Did discuss with Dr. Lorin Mercy who will admit for ongoing management.   Final Clinical Impressions(s) / ED Diagnoses   Final diagnoses:  Precordial chest pain    ED Discharge Orders    None       Forde Dandy, MD 08/09/17 2139

## 2017-08-09 NOTE — ED Notes (Signed)
Pt states that she feels a little better. States that it never really was chest pain, just indigestion feeling, but the nitro did help her feel better.

## 2017-08-09 NOTE — ED Notes (Signed)
Patient denies pain and is resting comfortably.  

## 2017-08-09 NOTE — ED Triage Notes (Signed)
Pt reports central non-radiating CP that began ~30 min PTA. Pt endorses nausea. Denies sob, vomiting, dizziness, diaphoresis. A&Ox4 in triage. NAD

## 2017-08-09 NOTE — H&P (Signed)
History and Physical    [name] QPY:[id number] DOB: 04-Feb-1951 DOA: 08/09/2017  PCP: Brandi Seashore, MD Consultants:  [facility] - cardiology; [facility] - GI Patient coming from:  Home - lives with son; [name]: son, [phone]  Chief Complaint: chest pain  HPI: [name] is a 66 y.o. female with medical history significant of IDDM; OSA not on CPAP; CAD; PVD; afib on Coumadin; remote lung cancer; HTN; and HLD presenting with chest pain starting at about 12pm today.  She was sitting down when it started.  Substernal in nature.  The pain lasted about 2 minutes.  It resolved spontaneously.  After that she felt like she indigestion.  No SOB, diaphoresis.  She has had similar symptoms before when she had a clogged artery.  Last saw cardiology in May of this year.  Last cath was in 2017  -Ost - Proximal LAD lesion, 80% and 99% in-stent restenosis. Post intervention with scoring balloon angioplasty followed by a Synergy DES stent covering the entire stented segment., there is a 0% residual stenosis. The lesion was previously treated with a 2 overlapping DES stents in 2013 and 2015. -Prox Cx lesion, 55% stenosed. Stable -Ramus lesion, 20% stenosed. -The left ventricular systolic function is normal. .  ED Course:  ASA and NTG in ER, no further chest pain but ongoing indigestion feeling.  Negative EKG and troponin.  Therapeutic INR.  CXR negative.    Review of Systems: As per HPI; otherwise review of systems reviewed and negative.   Ambulatory Status:  Ambulates with cane or a walker  Past Medical History:  Diagnosis Date  . Arthritis    "knees" (02/05/2016)  . Cancer ([place]) 5/10   LUL Vats   . Coronary artery disease   . GERD (gastroesophageal reflux disease)    tx meds  . H/O hiatal hernia   . Heart palpitations    monitor 04/19/12-05/10/12- frequent PAF with RVR- coumadin started  . History of blood transfusion "several"   last april 2015 s/p knee surgery (02/05/2016)  .  Hypercholesteremia   . Hypertension   . Lung cancer ([place])    2010, surgery 18% left lung no radiation or chemo  . Non-STEMI (non-ST elevated myocardial infarction) ([place]) 04/07/2012   See cath results below  . Paroxysmal atrial fibrillation ([place]) 04/07/2012   On Warfarin  . Peripheral vascular disease ([place]) 8/12   Lt SFA PTA  . Presence of stent in LAD coronary artery 04/07/2012   Xience Expedition DES 2.75 mm x 18 mm (dilated to 3.0 mm)  . Sleep apnea    does not wear CPAP  . Type II diabetes mellitus (HCC)    insulin dependent    Past Surgical History:  Procedure Laterality Date  . ABDOMINAL HYSTERECTOMY  1990  . BREAST EXCISIONAL BIOPSY Right   . CARDIAC CATHETERIZATION  11/04/2008   patent RCA, LM, and Circ, nl EF  . CARDIAC CATHETERIZATION  02/20/2002   patent coronaries with the only abnormality being a smooth luminla irregularity in the mid intermediate ramus branch no felt to be hemodynamically significant, nl LV  . CATARACT EXTRACTION W/ INTRAOCULAR LENS  IMPLANT, BILATERAL  2013  . CORONARY ANGIOPLASTY WITH STENT PLACEMENT  04/07/2012   Xience Expedition DES 2.32mm x 18 mm (dilated to 3.0 mm) to the prox LAD   . CORONARY ANGIOPLASTY WITH STENT PLACEMENT  2013; 2015  . DILATION AND CURETTAGE OF UTERUS  <1990  . FRACTURE SURGERY    . HAMMER TOE SURGERY Bilateral  1993   with screws, on screw removed  . JOINT REPLACEMENT    . KNEE ARTHROSCOPY Bilateral    left x2, right x1  . LAPAROSCOPIC CHOLECYSTECTOMY    . LOWER EXTREMITY ANGIOGRAM  05/17/11   directional atherectomy to the prox L SFA using a LX Man TurboHawk, ballooned with a [employer] balloon   . TONSILLECTOMY    . TOTAL KNEE ARTHROPLASTY Left 2003  . TOTAL KNEE REVISION Left 2007   opened in 2006 and cleaned out, 2007 revision  . VIDEO ASSISTED THORACOSCOPY (VATS)/ LOBECTOMY  01/2009    Social History   Socioeconomic History  . Marital status: Single    Spouse name: Not on file  . Number of children: Not on file    . Years of education: Not on file  . Highest education level: Not on file  Social Needs  . Financial resource strain: Not on file  . Food insecurity - worry: Not on file  . Food insecurity - inability: Not on file  . Transportation needs - medical: Not on file  . Transportation needs - non-medical: Not on file  Occupational History  . Occupation: Retired  Tobacco Use  . Smoking status: Former Smoker    Packs/day: 1.00    Years: 20.00    Pack years: 20.00    Types: Cigarettes    Last attempt to quit: 09/27/1984    Years since quitting: 32.8  . Smokeless tobacco: Never Used  Substance and Sexual Activity  . Alcohol use: No  . Drug use: No  . Sexual activity: No    Birth control/protection: Post-menopausal  Other Topics Concern  . Not on file  Social History Narrative   Patient lives alone.    Allergies  Allergen Reactions  . Levofloxacin Itching  . Morphine And Related Itching    Family History  Problem Relation Age of Onset  . Hypertension Mother   . Coronary artery disease Brother   . Hypertension Sister     Prior to Admission medications   Medication Sig Start Date End Date Taking? Authorizing Provider  cholecalciferol (VITAMIN D) 1000 UNITS tablet Take 1,000 Units by mouth at bedtime.    Yes [provider]  clopidogrel (PLAVIX) 75 MG tablet TAKE 1 TABLET BY MOUTH EVERY DAY Patient taking differently: TAKE 75 mg TABLET BY MOUTH EVERY DAY 02/24/17  Yes Croitoru, Mihai, MD  glimepiride (AMARYL) 2 MG tablet Take 2 mg daily by mouth.  12/23/15  Yes [provider]  insulin NPH-regular Human (NOVOLIN 70/30) (70-30) 100 UNIT/ML injection Inject 30-34 Units 2 (two) times daily with a meal into the skin. Take 42 units in the morning 20 units in the evening   Yes [provider]  lisinopril-hydrochlorothiazide (PRINZIDE,ZESTORETIC) 20-25 MG per tablet Take 1 tablet by mouth daily.   Yes [provider]  metoprolol tartrate (LOPRESSOR) 25  MG tablet Take 1 tablet (25 mg total) by mouth 2 (two) times daily. 05/05/17  Yes Croitoru, Mihai, MD  pantoprazole (PROTONIX) 40 MG tablet Take 1 tablet (40 mg total) by mouth daily. 06/01/16  Yes Croitoru, Mihai, MD  simvastatin (ZOCOR) 40 MG tablet TAKE 1 TABLET BY MOUTH AT BEDTIME. Patient taking differently: TAKE 40 mg TABLET BY MOUTH AT BEDTIME. 02/18/17  Yes Croitoru, Mihai, MD  warfarin (COUMADIN) 7.5 MG tablet TAKE 1/2 TO 1 TABLET DAILY AS DIRECTED BY COUMADIN CLINIC Patient taking differently: Take 3.75 on monday, wednesday and Friday. Take 7.5 on tuesday thursday saturday and Sunday 05/23/17  Yes Croitoru, Mihai, MD  metoCLOPramide (REGLAN) 10 MG tablet Take 10 mg by mouth 2 (two) times daily.     [provider]    Physical Exam: Vitals:   08/09/17 2130 08/09/17 2145 08/09/17 2200 08/09/17 2240  BP: (!) 119/49 (!) 108/48 (!) 111/45 133/60  Pulse: 72 69 71 72  Resp:      Temp:      TempSrc:      SpO2: 100% 100% 100% 100%     General:  Appears calm and comfortable and is NAD Eyes:  PERRL, EOMI, normal lids, iris ENT:  grossly normal hearing, lips & tongue, mmm; appropriate dentition Neck:  no LAD, masses or thyromegaly; no carotid bruits Cardiovascular:  RRR, no m/r/g. No LE edema.  Respiratory:   CTA bilaterally with no wheezes/rales/rhonchi.  Normal respiratory effort. Abdomen:  soft, NT, ND, NABS Back:   normal alignment, no CVAT Skin:  no rash or induration seen on limited exam Musculoskeletal:  grossly normal tone BUE/BLE, good ROM, no bony abnormality Lower extremity:  No LE edema.  Limited foot exam with no ulcerations.  2+ distal pulses. Psychiatric:  grossly normal mood and affect, speech fluent and appropriate, AOx3 Neurologic:  CN 2-12 grossly intact, moves all extremities in coordinated fashion, sensation intact    Radiological Exams on Admission: Dg Chest 2 View  Result Date: 08/09/2017 CLINICAL DATA:  Chest pain EXAM: CHEST  2 VIEW COMPARISON:   01/05/2016 FINDINGS: Low lung volumes. Mild cardiomegaly with central congestion. Mild increased opacity at the left lung base may partially relate to overlying soft tissue. Coronary stent. Aortic atherosclerosis. No pleural effusion. Surgical clips in the abdomen. IMPRESSION: 1. Low lung volumes with hazy opacity at the left lung base, likely augmented by overlying soft tissue. 2. Cardiomegaly with mild central congestion. Electronically Signed   By: Donavan Foil M.D.   On: 08/09/2017 14:32    EKG: Independently reviewed.  NSR with rate 69; RBBB and LVH with no evidence of acute ischemia   Labs on Admission: I have personally reviewed the available labs and imaging studies at the time of the admission.  Pertinent labs:   Glucose 256, 169 BUN 16/Creatinine 1.10/GFR 29; prior 9/1.01/>60 in 5/17 Troponin 0.00 Normal CBC INR 2.38   Assessment/Plan Principal Problem:   Chest pain Active Problems:   Essential hypertension   Paroxysmal atrial fibrillation   CAD s/p LAD DES 2013, 2015 and 2017   Dyslipidemia   Chronic anticoagulation   DM (diabetes mellitus), type 2 with peripheral vascular complications (HCC)   CKD (chronic kidney disease) stage 3, GFR 30-59 ml/min (HCC)   Chest pain -Patient with substernal chest pain that has resolved but she continues to feel a sensation of indigestion; this is consistent with prior episode of CAD requiring intervention. -1/3 typical symptoms suggestive of noncardiac chest pain.  -CXR unremarkable.   -Initial cardiac troponin negative.  -EKG not indicative of acute ischemia.   -GRACE score is 108; which predicts an in-hospital death rate of 1%.  -Will plan to place in observation status on telemetry to rule out ACS by overnight observation.  -cycle troponin q6h x 3 and repeat EKG in AM -Start ASA 81 mg  Daily along with her daily Plavix -Risk factor stratification with HgbA1c and FLP; will also check TSH -Cardiology consultation in AM (request  for consultation placed via [employer] message) - NPO for possible stress test  -Will plan to start Heparin drip if enzymes are positive and/or chest pain recurs  HTN -Takes Zestoretic and lopressor at home -Patient with good control while in the ER -Continue home meds  HLD -Change Zocor to Lipitor -Lipids were checked in 4/17 (TC 119, HDL 43, LDL 58, TG 90)  -Repeat lipid panel  DM -Last A1c was 9.2 in 4/17 -Patient is on 70/30; will hold and cover with Lantus since patient will be NPO after midnight -Will cover with moderate-scale SSI   PAF on Coumadin -Rate controlled with Lopressor -On Coumadin, will have pharmacy to dose -INR is appropriate  CKD -Appears to be relatively stable -Will follow with BMP in AM    DVT prophylaxis: Coumadin Code Status:  Full - confirmed with patient/family Family Communication: Son and another family member/friend present throughout evaluation  Disposition Plan:  Home once clinically improved Consults called: Cardiology via [place] message  Admission status: It is my clinical opinion that referral for OBSERVATION is reasonable and necessary in this patient based on the above information provided. The aforementioned taken together are felt to place the patient at high risk for further clinical deterioration. However it is anticipated that the patient may be medically stable for discharge from the hospital within 24 to 48 hours.    Karmen Bongo MD [facility]  If note is complete, please contact covering daytime or nighttime physician. [email or web address] Password [email or web address]  08/09/2017, 11:40 PM

## 2017-08-10 DIAGNOSIS — Z7901 Long term (current) use of anticoagulants: Secondary | ICD-10-CM | POA: Diagnosis not present

## 2017-08-10 DIAGNOSIS — R079 Chest pain, unspecified: Secondary | ICD-10-CM | POA: Diagnosis not present

## 2017-08-10 DIAGNOSIS — E1151 Type 2 diabetes mellitus with diabetic peripheral angiopathy without gangrene: Secondary | ICD-10-CM

## 2017-08-10 DIAGNOSIS — N183 Chronic kidney disease, stage 3 (moderate): Secondary | ICD-10-CM | POA: Diagnosis not present

## 2017-08-10 DIAGNOSIS — I480 Paroxysmal atrial fibrillation: Secondary | ICD-10-CM | POA: Diagnosis not present

## 2017-08-10 DIAGNOSIS — I200 Unstable angina: Secondary | ICD-10-CM | POA: Diagnosis not present

## 2017-08-10 DIAGNOSIS — I10 Essential (primary) hypertension: Secondary | ICD-10-CM | POA: Diagnosis not present

## 2017-08-10 DIAGNOSIS — E785 Hyperlipidemia, unspecified: Secondary | ICD-10-CM | POA: Diagnosis not present

## 2017-08-10 DIAGNOSIS — I259 Chronic ischemic heart disease, unspecified: Secondary | ICD-10-CM | POA: Diagnosis not present

## 2017-08-10 LAB — GLUCOSE, CAPILLARY
GLUCOSE-CAPILLARY: 209 mg/dL — AB (ref 65–99)
GLUCOSE-CAPILLARY: 151 mg/dL — AB (ref 65–99)

## 2017-08-10 LAB — CBG MONITORING, ED
GLUCOSE-CAPILLARY: 284 mg/dL — AB (ref 65–99)
GLUCOSE-CAPILLARY: 133 mg/dL — AB (ref 65–99)
Glucose-Capillary: 188 mg/dL — ABNORMAL HIGH (ref 65–99)

## 2017-08-10 LAB — TSH: TSH: 1.992 u[IU]/mL (ref 0.350–4.500)

## 2017-08-10 LAB — TROPONIN I
Troponin I: <0.03 ng/mL (ref <0.03)
Troponin I: <0.03 ng/mL (ref <0.03)

## 2017-08-10 LAB — LIPID PANEL
CHOLESTEROL: 133 mg/dL (ref 0–200)
HDL: 46 mg/dL (ref >40)
LDL Cholesterol: 71 mg/dL (ref 0–99)
TRIGLYCERIDES: 78 mg/dL (ref <150)
Total CHOL/HDL Ratio: 2.9 RATIO
VLDL: 16 mg/dL (ref 0–40)

## 2017-08-10 LAB — HEMOGLOBIN A1C
Hgb A1c MFr Bld: 8.1 % — ABNORMAL HIGH (ref 4.8–5.6)
Mean Plasma Glucose: 185.77 mg/dL

## 2017-08-10 LAB — PROTIME-INR
INR: 1.86
PROTHROMBIN TIME: 21.3 s — AB (ref 11.4–15.2)

## 2017-08-10 MED ORDER — HEPARIN (PORCINE) IN NACL 100-0.45 UNIT/ML-% IJ SOLN
800.0000 [IU]/h | INTRAMUSCULAR | Status: DC
  Administered 2017-08-10: 800 [IU]/h via INTRAVENOUS
  Filled 2017-08-10: qty 250

## 2017-08-10 MED ORDER — INSULIN GLARGINE 100 UNIT/ML ~~LOC~~ SOLN
20.0000 [IU] | Freq: Every day | SUBCUTANEOUS | Status: DC
  Filled 2017-08-10 (×2): qty 0.2

## 2017-08-10 MED ORDER — WARFARIN SODIUM 7.5 MG PO TABS: 7.5000 mg | ORAL_TABLET | ORAL | Status: DC

## 2017-08-10 MED ORDER — ONDANSETRON HCL 4 MG/2ML IJ SOLN: 4.0000 mg | Freq: Four times a day (QID) | INTRAMUSCULAR | Status: DC | PRN

## 2017-08-10 MED ORDER — HYDROCHLOROTHIAZIDE 25 MG PO TABS
25.0000 mg | ORAL_TABLET | Freq: Every day | ORAL | Status: DC
  Administered 2017-08-10 – 2017-08-16 (×7): 25 mg via ORAL
  Filled 2017-08-10 (×7): qty 1

## 2017-08-10 MED ORDER — PANTOPRAZOLE SODIUM 40 MG PO TBEC
40.0000 mg | DELAYED_RELEASE_TABLET | Freq: Every day | ORAL | Status: DC
  Administered 2017-08-10 – 2017-08-16 (×7): 40 mg via ORAL
  Filled 2017-08-10 (×7): qty 1

## 2017-08-10 MED ORDER — LISINOPRIL 20 MG PO TABS
20.0000 mg | ORAL_TABLET | Freq: Every day | ORAL | Status: DC
  Administered 2017-08-10 – 2017-08-16 (×7): 20 mg via ORAL
  Filled 2017-08-10 (×7): qty 1

## 2017-08-10 MED ORDER — SODIUM CHLORIDE 0.9 % WEIGHT BASED INFUSION
3.0000 mL/kg/h | INTRAVENOUS | Status: DC
  Administered 2017-08-11: 3 mL/kg/h via INTRAVENOUS

## 2017-08-10 MED ORDER — SODIUM CHLORIDE 0.9 % IV SOLN: 250.0000 mL | INTRAVENOUS | Status: DC | PRN

## 2017-08-10 MED ORDER — ACETAMINOPHEN 325 MG PO TABS: 650.0000 mg | ORAL_TABLET | ORAL | Status: DC | PRN

## 2017-08-10 MED ORDER — CLOPIDOGREL BISULFATE 75 MG PO TABS
75.0000 mg | ORAL_TABLET | Freq: Every day | ORAL | Status: DC
  Administered 2017-08-10: 75 mg via ORAL
  Filled 2017-08-10: qty 1

## 2017-08-10 MED ORDER — INSULIN ASPART 100 UNIT/ML ~~LOC~~ SOLN
0.0000 [IU] | Freq: Three times a day (TID) | SUBCUTANEOUS | Status: DC
Start: 2017-08-10 — End: 2017-08-17
  Administered 2017-08-10: 2 [IU] via SUBCUTANEOUS
  Administered 2017-08-10: 3 [IU] via SUBCUTANEOUS
  Administered 2017-08-10 – 2017-08-11 (×2): 5 [IU] via SUBCUTANEOUS
  Administered 2017-08-11: 3 [IU] via SUBCUTANEOUS
  Administered 2017-08-12: 2 [IU] via SUBCUTANEOUS
  Administered 2017-08-12 (×2): 5 [IU] via SUBCUTANEOUS
  Administered 2017-08-13 (×2): 3 [IU] via SUBCUTANEOUS
  Administered 2017-08-13: 8 [IU] via SUBCUTANEOUS
  Administered 2017-08-14 (×2): 5 [IU] via SUBCUTANEOUS
  Administered 2017-08-14 – 2017-08-15 (×2): 3 [IU] via SUBCUTANEOUS
  Administered 2017-08-15 (×2): 5 [IU] via SUBCUTANEOUS
  Administered 2017-08-16: 3 [IU] via SUBCUTANEOUS
  Administered 2017-08-16: 5 [IU] via SUBCUTANEOUS
  Administered 2017-08-16: 3 [IU] via SUBCUTANEOUS
  Filled 2017-08-10 (×2): qty 1

## 2017-08-10 MED ORDER — WARFARIN SODIUM 7.5 MG PO TABS: 3.7500 mg | ORAL_TABLET | ORAL | Status: DC

## 2017-08-10 MED ORDER — WARFARIN - PHARMACIST DOSING INPATIENT: Freq: Every day | Status: DC

## 2017-08-10 MED ORDER — METOPROLOL TARTRATE 25 MG PO TABS
25.0000 mg | ORAL_TABLET | Freq: Two times a day (BID) | ORAL | Status: DC
  Administered 2017-08-10 – 2017-08-16 (×14): 25 mg via ORAL
  Filled 2017-08-10 (×14): qty 1

## 2017-08-10 MED ORDER — SODIUM CHLORIDE 0.9 % WEIGHT BASED INFUSION
1.0000 mL/kg/h | INTRAVENOUS | Status: DC
  Administered 2017-08-11 (×2): 1 mL/kg/h via INTRAVENOUS

## 2017-08-10 MED ORDER — ATORVASTATIN CALCIUM 40 MG PO TABS
40.0000 mg | ORAL_TABLET | Freq: Every day | ORAL | Status: DC
  Administered 2017-08-10 – 2017-08-20 (×10): 40 mg via ORAL
  Filled 2017-08-10 (×11): qty 1

## 2017-08-10 MED ORDER — ASPIRIN 81 MG PO CHEW
81.0000 mg | CHEWABLE_TABLET | ORAL | Status: AC
  Administered 2017-08-11: 81 mg via ORAL
  Filled 2017-08-10: qty 1

## 2017-08-10 MED ORDER — SODIUM CHLORIDE 0.9% FLUSH
3.0000 mL | Freq: Two times a day (BID) | INTRAVENOUS | Status: DC
  Administered 2017-08-10 – 2017-08-11 (×2): 3 mL via INTRAVENOUS

## 2017-08-10 MED ORDER — INSULIN ASPART 100 UNIT/ML ~~LOC~~ SOLN
0.0000 [IU] | Freq: Every day | SUBCUTANEOUS | Status: DC
  Administered 2017-08-10: 3 [IU] via SUBCUTANEOUS
  Administered 2017-08-11 – 2017-08-15 (×2): 2 [IU] via SUBCUTANEOUS
  Filled 2017-08-10: qty 1

## 2017-08-10 MED ORDER — GI COCKTAIL ~~LOC~~: 30.0000 mL | Freq: Four times a day (QID) | ORAL | Status: DC | PRN

## 2017-08-10 MED ORDER — SODIUM CHLORIDE 0.9% FLUSH: 3.0000 mL | INTRAVENOUS | Status: DC | PRN

## 2017-08-10 MED ORDER — LISINOPRIL-HYDROCHLOROTHIAZIDE 20-25 MG PO TABS: 1.0000 | ORAL_TABLET | Freq: Every day | ORAL | Status: DC

## 2017-08-10 MED ORDER — ASPIRIN EC 81 MG PO TBEC
81.0000 mg | DELAYED_RELEASE_TABLET | Freq: Every day | ORAL | Status: DC
  Administered 2017-08-10 – 2017-08-16 (×6): 81 mg via ORAL
  Filled 2017-08-10 (×6): qty 1

## 2017-08-10 NOTE — ED Notes (Signed)
CBG information did not transfer - CBG 284

## 2017-08-10 NOTE — ED Notes (Signed)
Pt to go to [facility] room 8.

## 2017-08-10 NOTE — ED Notes (Signed)
Pt reports she does not take Lantus, she will talk to the provider in the morning about starting it.

## 2017-08-10 NOTE — Consult Note (Signed)
Cardiology Consult    Patient ID: [name] MRN: [id number], DOB/AGE: [birth year]   Admit date: 08/09/2017 Date of Consult: 08/10/2017  Primary Physician: [name], MD Primary Cardiologist: Dr. Sallyanne Kuster Requesting Provider: Dr. Eliseo Squires  Reason for Consult: Chest pain  Patient Profile   [name] is a 66 y.o. female with significant PMH of CAD s/p LAD stent with 2 episodes of re-stenosis, HTN, HLD, T2DM, GERD with esophageal stricture presenting to [facility] with chest pain.  Past Medical History   Past Medical History:  Diagnosis Date  . Arthritis    "knees" (02/05/2016)  . Cancer ([place]) 5/10   LUL Vats   . Coronary artery disease   . GERD (gastroesophageal reflux disease)    tx meds  . H/O hiatal hernia   . Heart palpitations    monitor 04/19/12-05/10/12- frequent PAF with RVR- coumadin started  . History of blood transfusion "several"   last april 2015 s/p knee surgery (02/05/2016)  . Hypercholesteremia   . Hypertension   . Lung cancer ([place])    2010, surgery 18% left lung no radiation or chemo  . Non-STEMI (non-ST elevated myocardial infarction) ([place]) 04/07/2012   See cath results below  . Paroxysmal atrial fibrillation ([place]) 04/07/2012   On Warfarin  . Peripheral vascular disease ([place]) 8/12   Lt SFA PTA  . Presence of stent in LAD coronary artery 04/07/2012   Xience Expedition DES 2.75 mm x 18 mm (dilated to 3.0 mm)  . Sleep apnea    does not wear CPAP  . Type II diabetes mellitus (HCC)    insulin dependent    Past Surgical History:  Procedure Laterality Date  . ABDOMINAL HYSTERECTOMY  1990  . BREAST EXCISIONAL BIOPSY Right   . CARDIAC CATHETERIZATION  11/04/2008   patent RCA, LM, and Circ, nl EF  . CARDIAC CATHETERIZATION  02/20/2002   patent coronaries with the only abnormality being a smooth luminla irregularity in the mid intermediate ramus branch no felt to be hemodynamically significant, nl LV  . CATARACT EXTRACTION W/ INTRAOCULAR LENS   IMPLANT, BILATERAL  2013  . CORONARY ANGIOPLASTY WITH STENT PLACEMENT  04/07/2012   Xience Expedition DES 2.53mm x 18 mm (dilated to 3.0 mm) to the prox LAD   . CORONARY ANGIOPLASTY WITH STENT PLACEMENT  2013; 2015  . DILATION AND CURETTAGE OF UTERUS  <1990  . FRACTURE SURGERY    . HAMMER TOE SURGERY Bilateral 1993   with screws, on screw removed  . JOINT REPLACEMENT    . KNEE ARTHROSCOPY Bilateral    left x2, right x1  . LAPAROSCOPIC CHOLECYSTECTOMY    . LOWER EXTREMITY ANGIOGRAM  05/17/11   directional atherectomy to the prox L SFA using a LX Man TurboHawk, ballooned with a [employer] balloon   . TONSILLECTOMY    . TOTAL KNEE ARTHROPLASTY Left 2003  . TOTAL KNEE REVISION Left 2007   opened in 2006 and cleaned out, 2007 revision  . VIDEO ASSISTED THORACOSCOPY (VATS)/ LOBECTOMY  01/2009     Allergies  Allergies  Allergen Reactions  . Levofloxacin Itching  . Morphine And Related Itching    History of Present Illness    [name] is a 66 y.o. female with significant PMH of CAD s/p LAD stent with 2 episodes of re-stenosis, HTN, HLD, T2DM, h/o lung cancer s/p partial LLL resection, GERD with esophageal stricture presenting to [facility] with chest pain. Patient states that she has had months of substernal discomfort mostly following  meals, however yesterday it was a more intense and associated with some radiation to both right and left chest, and some palpitations. She states that yesterday she did have some nausea but attributed it to being out of Reglan for her gastroparesis. Patient endorses the discomfort as an indigestion and also endorses uncontrolled GERD symptoms. The discomfort is not relieved with anything at home and not associated with worsening with exertion, shortness of breath, diaphoresis, nausea (except for yesterday), vomiting, dizziness, syncope. By the time patient presented to the ED, she states the pain had improved; and it further improved with ASA and nitro. She  reports recurrence of it during my evaluation with no associated symptoms and just pin-point, substernal discomfort.   Patient states that this discomfort is similar to one prior re-stenosis of her LAD stent. She reports compliance with her medicines. She does report occasional palpitations but states these are usually well controlled with her metoprolol. She does report left ankle swelling which resolves with leg elevation. She denies hematochezia, melena, hematuria.   Patient is mobility limited due to her L knee pain for which she has had multiple surgeries.  Patient is a prior 15 pack year smoker but quit 32 years ago. Her father died at age of 61 from MI.  Inpatient Medications    . aspirin EC  81 mg Oral Daily  . atorvastatin  40 mg Oral q1800  . clopidogrel  75 mg Oral Daily  . lisinopril  20 mg Oral Daily   And  . hydrochlorothiazide  25 mg Oral Daily  . insulin aspart  0-15 Units Subcutaneous TID WC  . insulin aspart  0-5 Units Subcutaneous QHS  . insulin glargine  20 Units Subcutaneous Daily  . metoprolol tartrate  25 mg Oral BID  . pantoprazole  40 mg Oral Daily  . [START ON 08/11/2017] warfarin  7.5 mg Oral Q T,Th,S,Su-1800   And  . warfarin  3.75 mg Oral Q M,W,F-1800  . Warfarin - Pharmacist Dosing Inpatient   Does not apply q1800   Outpatient Medications    Prior to Admission medications   Medication Sig Start Date End Date Taking? Authorizing Provider  cholecalciferol (VITAMIN D) 1000 UNITS tablet Take 1,000 Units by mouth at bedtime.    Yes [provider]  clopidogrel (PLAVIX) 75 MG tablet TAKE 1 TABLET BY MOUTH EVERY DAY Patient taking differently: TAKE 75 mg TABLET BY MOUTH EVERY DAY 02/24/17  Yes Croitoru, Mihai, MD  glimepiride (AMARYL) 2 MG tablet Take 2 mg daily by mouth.  12/23/15  Yes [provider]  insulin NPH-regular Human (NOVOLIN 70/30) (70-30) 100 UNIT/ML injection Inject 30-34 Units 2 (two) times daily with a meal into the skin. Take  42 units in the morning 20 units in the evening   Yes [provider]  lisinopril-hydrochlorothiazide (PRINZIDE,ZESTORETIC) 20-25 MG per tablet Take 1 tablet by mouth daily.   Yes [provider]  metoprolol tartrate (LOPRESSOR) 25 MG tablet Take 1 tablet (25 mg total) by mouth 2 (two) times daily. 05/05/17  Yes Croitoru, Mihai, MD  pantoprazole (PROTONIX) 40 MG tablet Take 1 tablet (40 mg total) by mouth daily. 06/01/16  Yes Croitoru, Mihai, MD  simvastatin (ZOCOR) 40 MG tablet TAKE 1 TABLET BY MOUTH AT BEDTIME. Patient taking differently: TAKE 40 mg TABLET BY MOUTH AT BEDTIME. 02/18/17  Yes Croitoru, Mihai, MD  warfarin (COUMADIN) 7.5 MG tablet TAKE 1/2 TO 1 TABLET DAILY AS DIRECTED BY COUMADIN CLINIC Patient taking differently: Take 3.75 on  monday, wednesday and Friday. Take 7.5 on tuesday thursday saturday and Sunday 05/23/17  Yes Croitoru, Dani Gobble, MD   Family History   Family History  Problem Relation Age of Onset  . Hypertension Mother   . Coronary artery disease Brother   . Hypertension Sister    Social History    Social History   Socioeconomic History  . Marital status: Single    Spouse name: Not on file  . Number of children: Not on file  . Years of education: Not on file  . Highest education level: Not on file  Social Needs  . Financial resource strain: Not on file  . Food insecurity - worry: Not on file  . Food insecurity - inability: Not on file  . Transportation needs - medical: Not on file  . Transportation needs - non-medical: Not on file  Occupational History  . Occupation: Retired  Tobacco Use  . Smoking status: Former Smoker    Packs/day: 1.00    Years: 20.00    Pack years: 20.00    Types: Cigarettes    Last attempt to quit: 09/27/1984    Years since quitting: 32.8  . Smokeless tobacco: Never Used  Substance and Sexual Activity  . Alcohol use: No  . Drug use: No  . Sexual activity: No    Birth control/protection: Post-menopausal  Other  Topics Concern  . Not on file  Social History Narrative   Patient lives alone.     Review of Systems   All other systems reviewed and are otherwise negative except as noted above.  Physical Exam    Blood pressure 134/68, pulse 68, temperature 98.4 F (36.9 C), temperature source Oral, resp. rate (!) 22, SpO2 100 %.  General: Pleasant, NAD, sitting up in recliner Psych: Normal mood and affect. Neuro: Alert and oriented X 3. Moves all extremities spontaneously. CN 2-12 grossly intact HEENT: Normal, moist mucous membranes  Neck: Supple, no JVD. Lungs:  Resp regular and unlabored, CTAB with no rales, wheezes or rhonchi. Heart: RRR, no murmurs, rubs or gallops. Abdomen: Soft, non-tender, non-distended, +BS.  Extremities: No clubbing, cyanosis. Mild dependent edema in bil LEs; no calf tenderness or erythema. PT 2+ and equal bilaterally.  Labs    Troponin [facility] of Care Test) Recent Labs    08/09/17 1354  TROPIPOC 0.00   No results for input(s): CKTOTAL, CKMB, TROPONINI in the last 72 hours. Lab Results  Component Value Date   WBC 8.4 08/09/2017   HGB 12.7 08/09/2017   HCT 38.3 08/09/2017   MCV 86.5 08/09/2017   PLT 275 08/09/2017    Recent Labs  Lab 08/09/17 1341  NA 133*  K 4.4  CL 99*  CO2 27  BUN 16  CREATININE 1.10*  CALCIUM 9.6  GLUCOSE 256*   Lab Results  Component Value Date   CHOL 119 01/06/2016   HDL 43 01/06/2016   LDLCALC 58 01/06/2016   TRIG 90 01/06/2016   Lab Results  Component Value Date   DDIMER (H) 08/31/2009    1.48        AT THE INHOUSE ESTABLISHED CUTOFF VALUE OF 0.48 ug/mL FEU, THIS ASSAY HAS BEEN DOCUMENTED IN THE LITERATURE TO HAVE A SENSITIVITY AND NEGATIVE PREDICTIVE VALUE OF AT LEAST 98 TO 99%.  THE TEST RESULT SHOULD BE CORRELATED WITH AN ASSESSMENT OF THE CLINICAL PROBABILITY OF DVT / VTE.     Radiology Studies    Dg Chest 2 View  Result Date: 08/09/2017 CLINICAL DATA:  Chest  pain EXAM: CHEST  2 VIEW COMPARISON:   01/05/2016 FINDINGS: Low lung volumes. Mild cardiomegaly with central congestion. Mild increased opacity at the left lung base may partially relate to overlying soft tissue. Coronary stent. Aortic atherosclerosis. No pleural effusion. Surgical clips in the abdomen. IMPRESSION: 1. Low lung volumes with hazy opacity at the left lung base, likely augmented by overlying soft tissue. 2. Cardiomegaly with mild central congestion. Electronically Signed   By: Donavan Foil M.D.   On: 08/09/2017 14:32    ECG & Cardiac Imaging   LHC 02/05/2016: 1. Ost - Proximal LAD lesion, 80% and 99% in-stent restenosis. Post intervention with scoring balloon angioplasty followed by a Synergy DES stent covering the entire stented segment., there is a 0% residual stenosis. The lesion was previously treated with a 2 overlapping DES stents in 2013 and 2015. 2. Prox Cx lesion, 55% stenosed. Stable 3. Ramus lesion, 20% stenosed. 4. The left ventricular systolic function is normal Recommendation for lifelong Plavix due to 2 episodes of instent restenosis.  EKG, personally reviewed - SR, RBBB, no acute ST or T wave changes concerning for ischemia.  Telemetry, personally reviewed - SR  Assessment & Plan   Chest pain: Patient with significant CAD history, RF. She received 324mg  ASA on arrival to ED and nitro with improvement in her symptoms. Initial troponin in ED was negative and no further ones phlebotomy has not been able to draw further labs yet. EKG is nonischemic. Notably, patient also has significant GI history of cervical esophageal web and stricture with self-reported uncontrolled GERD. HEART score of 5, TIMI score of 5. Will pursue cath in this high risk patient - scheduled for tomorrow.  CAD s/p prox LAD stent with prior in-stent restenosis: On plavix 75mg  daily - lifelong therapy; lisinopril 20mg  daily, metoprolol 25mg  BID  PAF: In sinus rhythm currently. On warfarin for anticoagulation; will change to heparin when  INR <2 for cath - pharmacy dosing.  HTN: Continue current regimen.  T2DM: Per pt, A1c 8.5 in Oct 2018. Poorly controlled on home regimen of mixed insulin.  CKD stage 2/3: Stable. Continue monitoring with cath.  Signed, Alphonzo Grieve, MD 08/10/2017, 10:28 AM   Patient examined chart reviewed Discussed care with resident and patient. Likely recurrent angina. Needs cath And plan would be for off pump LIMA to LAD if she has restenosis of multiple LAD stents again. Hold coumadin Continue plavix for now but will need to hold as well and check P2Y if surgery needed. Have called lab and put on For Dr Martinique at 1:30 tomorrow pending INR. Caths have been done radially and good pulse. Continue medical Rx no acute ECG changes troponin negative Exam with obese elderly black female soft SEM clear lungs and trace LE edema.   Jenkins Rouge

## 2017-08-10 NOTE — Progress Notes (Signed)
[name]  ZHG:[id number] DOB: Mar 21, 1951 DOA: 08/09/2017 PCP: [name], MD   Outpatient Specialists:     Brief Narrative:  [name] is a 66 y.o. female with medical history significant of IDDM; OSA not on CPAP; CAD; PVD; afib on Coumadin; remote lung cancer; HTN; and HLD presenting with chest pain starting at about 12pm today.  She was sitting down when it started.  Substernal in nature.  The pain lasted about 2 minutes.  It resolved spontaneously.  After that she felt like she indigestion.  No SOB, diaphoresis.  She has had similar symptoms before when she had a clogged artery.  Last saw cardiology in May of this year.  Last cath was in 2017  -Ost - Proximal LAD lesion, 80% and 99% in-stent restenosis. Post intervention with scoring balloon angioplasty followed by a Synergy DES stent covering the entire stented segment., there is a 0% residual stenosis. The lesion was previously treated with a 2 overlapping DES stents in 2013 and 2015. -Prox Cx lesion, 55% stenosed. Stable -Ramus lesion, 20% stenosed. -The left ventricular systolic function is normal. .     Assessment & Plan:   Principal Problem:   Chest pain Active Problems:   Essential hypertension   Paroxysmal atrial fibrillation   CAD s/p LAD DES 2013, 2015 and 2017   Dyslipidemia   Chronic anticoagulation   DM (diabetes mellitus), type 2 with peripheral vascular complications (HCC)   CKD (chronic kidney disease) stage 3, GFR 30-59 ml/min (HCC)   Chest pain -cardiology consult: plan for cath in the AM  HTN -Takes Zestoretic and lopressor at home -Patient with good control while in the ER -Continue home meds  HLD -Change Zocor to Lipitor -LDL:71  DM -Last A1c was 9.2 in 4/17 -Patient is on 70/30; will hold and cover with Lantus since patient will be NPO after midnight -Will cover with moderate-scale SSI   PAF on Coumadin -Rate controlled with  Lopressor -coumadin to heparin when INR <2  CKD -Appears to be relatively stable -Will follow with BMP in AM      DVT prophylaxis:  Coumadin to heparin  Code Status: Full Code   Family Communication:   Disposition Plan:     Consultants:   cards    Subjective: No SOB  Objective: Vitals:   08/10/17 1115 08/10/17 1208 08/10/17 1230 08/10/17 1240  BP: (!) 159/66 (!) 149/55    Pulse: 77  64 60  Resp: 13 17 15 19   Temp:      TempSrc:      SpO2: 100%  100% 100%   No intake or output data in the 24 hours ending 08/10/17 1314 There were no vitals filed for this visit.  Examination:  General exam: Appears calm and comfortable  Respiratory system: Clear to auscultation. Respiratory effort normal. Cardiovascular system: S1 & S2 heard, RRR. No JVD, murmurs, rubs, gallops or clicks. No pedal edema. Gastrointestinal system: Abdomen is nondistended, soft and nontender. No organomegaly or masses felt. Normal bowel sounds heard. Central nervous system: Alert and oriented. No focal neurological deficits. Extremities: Symmetric 5 x 5 power. Skin: No rashes, lesions or ulcers Psychiatry: Judgement and insight appear normal. Mood & affect appropriate.     Data Reviewed: I have personally reviewed following labs and imaging studies  CBC: Recent Labs  Lab 08/09/17 1341  WBC 8.4  HGB 12.7  HCT 38.3  MCV 86.5  PLT 196   Basic Metabolic Panel: Recent  Labs  Lab 08/09/17 1341  NA 133*  K 4.4  CL 99*  CO2 27  GLUCOSE 256*  BUN 16  CREATININE 1.10*  CALCIUM 9.6   GFR: CrCl cannot be calculated (Unknown ideal weight.). Liver Function Tests: No results for input(s): AST, ALT, ALKPHOS, BILITOT, PROT, ALBUMIN in the last 168 hours. No results for input(s): LIPASE, AMYLASE in the last 168 hours. No results for input(s): AMMONIA in the last 168 hours. Coagulation Profile: Recent Labs  Lab 08/09/17 1341  INR 2.38   Cardiac Enzymes: Recent Labs  Lab  08/10/17 1141  TROPONINI <0.03   BNP (last 3 results) No results for input(s): PROBNP in the last 8760 hours. HbA1C: Recent Labs    08/10/17 1141  HGBA1C 8.1*   CBG: Recent Labs  Lab 08/09/17 2221 08/10/17 0242 08/10/17 0852  GLUCAP 169* 284* 133*   Lipid Profile: Recent Labs    08/10/17 1141  CHOL 133  HDL 46  LDLCALC 71  TRIG 78  CHOLHDL 2.9   Thyroid Function Tests: Recent Labs    08/10/17 1141  TSH 1.992   Anemia Panel: No results for input(s): VITAMINB12, FOLATE, FERRITIN, TIBC, IRON, RETICCTPCT in the last 72 hours. Urine analysis:    Component Value Date/Time   COLORURINE YELLOW 12/21/2013 1129   APPEARANCEUR CLEAR 12/21/2013 1129   LABSPEC 1.014 12/21/2013 1129   PHURINE 6.5 12/21/2013 1129   GLUCOSEU NEGATIVE 12/21/2013 1129   HGBUR NEGATIVE 12/21/2013 1129   BILIRUBINUR NEGATIVE 12/21/2013 1129   KETONESUR NEGATIVE 12/21/2013 1129   PROTEINUR NEGATIVE 12/21/2013 1129   UROBILINOGEN 0.2 12/21/2013 1129   NITRITE NEGATIVE 12/21/2013 1129   LEUKOCYTESUR NEGATIVE 12/21/2013 1129    )No results found for this or any previous visit (from the past 240 hour(s)).    Anti-infectives (From admission, onward)   None       Radiology Studies: Dg Chest 2 View  Result Date: 08/09/2017 CLINICAL DATA:  Chest pain EXAM: CHEST  2 VIEW COMPARISON:  01/05/2016 FINDINGS: Low lung volumes. Mild cardiomegaly with central congestion. Mild increased opacity at the left lung base may partially relate to overlying soft tissue. Coronary stent. Aortic atherosclerosis. No pleural effusion. Surgical clips in the abdomen. IMPRESSION: 1. Low lung volumes with hazy opacity at the left lung base, likely augmented by overlying soft tissue. 2. Cardiomegaly with mild central congestion. Electronically Signed   By: Donavan Foil M.D.   On: 08/09/2017 14:32        Scheduled Meds: . aspirin EC  81 mg Oral Daily  . atorvastatin  40 mg Oral q1800  . clopidogrel  75 mg Oral  Daily  . lisinopril  20 mg Oral Daily   And  . hydrochlorothiazide  25 mg Oral Daily  . insulin aspart  0-15 Units Subcutaneous TID WC  . insulin aspart  0-5 Units Subcutaneous QHS  . insulin glargine  20 Units Subcutaneous Daily  . metoprolol tartrate  25 mg Oral BID  . pantoprazole  40 mg Oral Daily   Continuous Infusions:   LOS: 0 days    Time spent: 25 min    [place], DO [facility] Pager [phone]  If 7PM-7AM, please contact night-coverage [email or web address] Password [email or web address] 08/10/2017, 1:14 PM

## 2017-08-10 NOTE — Progress Notes (Signed)
ANTICOAGULATION CONSULT NOTE -Follow up  Pharmacy Consult for heparin Indication: atrial fibrillation  Allergies  Allergen Reactions  . Levofloxacin Itching  . Morphine And Related Itching    Patient Measurements: Height: 5\' 3"  (160 cm) Weight: 274 lb 8 oz (124.5 kg) IBW/kg (Calculated) : 52.4  Heparin dosing weight:  83.2 kg  Vital Signs: Temp: 97.6 F (36.4 C) (11/14 1635) Temp Source: Oral (11/14 1635) BP: 135/52 (11/14 1635) Pulse Rate: 64 (11/14 1635)  Labs: Recent Labs    08/09/17 1341 08/10/17 1141 08/10/17 1629  HGB 12.7  --   --   HCT 38.3  --   --   PLT 275  --   --   LABPROT 25.8*  --  21.3*  INR 2.38  --  1.86  CREATININE 1.10*  --   --   TROPONINI  --  <0.03 <0.03    Estimated Creatinine Clearance: 63.6 mL/min (A) (by C-G formula based on SCr of 1.1 mg/dL (H)).  Assessment: 66 y.o. female admitted with chest pain, h/o Afib. Plan was to continue warfarin, but now cards would like to start heparin when INR <2.  INR 1.86 this evening, thus we will start IV heparin bridge now.  CBC this AM was within normal limits. No bleeding noted  Goal of Therapy:  INR 2-3 Monitor platelets by anticoagulation protocol: Yes   Plan:  INR is < 2.0 thus we will start heparin tonight. Heparin drip 800 units/hr. No bolus.  Heparin level 6-8 hours after start of IV heparin drip. Daily heparin level, INR and CBC.   Nicole Cella, RPh Clinical Pharmacist Pager: [phone]-[phone] [phone] p-[phone] phone [phone] or [phone] Main pharmacy [phone] 08/10/2017 6:41 PM

## 2017-08-10 NOTE — Progress Notes (Signed)
ANTICOAGULATION CONSULT NOTE - Initial Consult  Pharmacy Consult for Coumadin Indication: atrial fibrillation  Allergies  Allergen Reactions  . Levofloxacin Itching  . Morphine And Related Itching    Patient Measurements:    Vital Signs: BP: 138/53 (11/14 0715) Pulse Rate: 66 (11/14 0715)  Labs: Recent Labs    08/09/17 1341  HGB 12.7  HCT 38.3  PLT 275  LABPROT 25.8*  INR 2.38  CREATININE 1.10*    CrCl cannot be calculated (Unknown ideal weight.).   Medical History: Past Medical History:  Diagnosis Date  . Arthritis    "knees" (02/05/2016)  . Cancer ([place]) 5/10   LUL Vats   . Coronary artery disease   . GERD (gastroesophageal reflux disease)    tx meds  . H/O hiatal hernia   . Heart palpitations    monitor 04/19/12-05/10/12- frequent PAF with RVR- coumadin started  . History of blood transfusion "several"   last april 2015 s/p knee surgery (02/05/2016)  . Hypercholesteremia   . Hypertension   . Lung cancer ([place])    2010, surgery 18% left lung no radiation or chemo  . Non-STEMI (non-ST elevated myocardial infarction) ([place]) 04/07/2012   See cath results below  . Paroxysmal atrial fibrillation ([place]) 04/07/2012   On Warfarin  . Peripheral vascular disease ([place]) 8/12   Lt SFA PTA  . Presence of stent in LAD coronary artery 04/07/2012   Xience Expedition DES 2.75 mm x 18 mm (dilated to 3.0 mm)  . Sleep apnea    does not wear CPAP  . Type II diabetes mellitus (HCC)    insulin dependent    Medications:  No current facility-administered medications on file prior to encounter.    Current Outpatient Medications on File Prior to Encounter  Medication Sig Dispense Refill  . cholecalciferol (VITAMIN D) 1000 UNITS tablet Take 1,000 Units by mouth at bedtime.     . clopidogrel (PLAVIX) 75 MG tablet TAKE 1 TABLET BY MOUTH EVERY DAY (Patient taking differently: TAKE 75 mg TABLET BY MOUTH EVERY DAY) 90 tablet 3  . glimepiride (AMARYL) 2 MG tablet Take 2 mg daily by  mouth.     . insulin NPH-regular Human (NOVOLIN 70/30) (70-30) 100 UNIT/ML injection Inject 30-34 Units 2 (two) times daily with a meal into the skin. Take 42 units in the morning 20 units in the evening    . lisinopril-hydrochlorothiazide (PRINZIDE,ZESTORETIC) 20-25 MG per tablet Take 1 tablet by mouth daily.    . metoprolol tartrate (LOPRESSOR) 25 MG tablet Take 1 tablet (25 mg total) by mouth 2 (two) times daily. 60 tablet 11  . pantoprazole (PROTONIX) 40 MG tablet Take 1 tablet (40 mg total) by mouth daily. 90 tablet 3  . simvastatin (ZOCOR) 40 MG tablet TAKE 1 TABLET BY MOUTH AT BEDTIME. (Patient taking differently: TAKE 40 mg TABLET BY MOUTH AT BEDTIME.) 90 tablet 2  . warfarin (COUMADIN) 7.5 MG tablet TAKE 1/2 TO 1 TABLET DAILY AS DIRECTED BY COUMADIN CLINIC (Patient taking differently: Take 3.75 on monday, wednesday and Friday. Take 7.5 on tuesday thursday saturday and Sunday) 90 tablet 0    Assessment: 66 y.o. female admitted with chest pain, h/o Afib, to continue Coumadin Goal of Therapy:  INR 2-3 Monitor platelets by anticoagulation protocol: Yes   Plan:  Continue home Coumadin regimen  [name], Bronson Curb 08/10/2017,7:57 AM

## 2017-08-10 NOTE — ED Notes (Signed)
Pt given sandwich, crackers and drink. Pt to be NPO after midnight. Pt aware.

## 2017-08-10 NOTE — Progress Notes (Signed)
ANTICOAGULATION CONSULT NOTE - Initial Consult  Pharmacy Consult for heparin Indication: atrial fibrillation  Allergies  Allergen Reactions  . Levofloxacin Itching  . Morphine And Related Itching    Patient Measurements:    Vital Signs: BP: 150/74 (11/14 1100) Pulse Rate: 73 (11/14 1100)  Labs: Recent Labs    08/09/17 1341  HGB 12.7  HCT 38.3  PLT 275  LABPROT 25.8*  INR 2.38  CREATININE 1.10*    CrCl cannot be calculated (Unknown ideal weight.).  Assessment: 66 y.o. female admitted with chest pain, h/o Afib. Plan was to continue warfarin, but now cards would like to start heparin when INR <2.  INR 2.38. Hgb 12.7, plts 275- no bleeding noted.  Goal of Therapy:  INR 2-3 Monitor platelets by anticoagulation protocol: Yes   Plan:  INR at 1800 tonight  Start heparin when INR <2  Kema Santaella D. Cephas Revard, PharmD, BCPS Clinical Pharmacist Clinical Phone for 08/10/2017 until 3:30pm: [phone] If after 3:30pm, please call main pharmacy at [phone] 08/10/2017 11:19 AM

## 2017-08-11 ENCOUNTER — Encounter (HOSPITAL_COMMUNITY)
Admission: EM | Disposition: A | Discharge status: Skilled Nursing Facility | Payer: Self-pay | Source: Home / Self Care | Attending: Thoracic Surgery (Cardiothoracic Vascular Surgery)

## 2017-08-11 DIAGNOSIS — T82855A Stenosis of coronary artery stent, initial encounter: Secondary | ICD-10-CM | POA: Diagnosis present

## 2017-08-11 DIAGNOSIS — I5042 Chronic combined systolic (congestive) and diastolic (congestive) heart failure: Secondary | ICD-10-CM | POA: Diagnosis present

## 2017-08-11 DIAGNOSIS — K3184 Gastroparesis: Secondary | ICD-10-CM | POA: Diagnosis present

## 2017-08-11 DIAGNOSIS — I25110 Atherosclerotic heart disease of native coronary artery with unstable angina pectoris: Principal | ICD-10-CM

## 2017-08-11 DIAGNOSIS — Z01810 Encounter for preprocedural cardiovascular examination: Secondary | ICD-10-CM | POA: Diagnosis not present

## 2017-08-11 DIAGNOSIS — N3281 Overactive bladder: Secondary | ICD-10-CM | POA: Diagnosis present

## 2017-08-11 DIAGNOSIS — E871 Hypo-osmolality and hyponatremia: Secondary | ICD-10-CM | POA: Diagnosis present

## 2017-08-11 DIAGNOSIS — E1151 Type 2 diabetes mellitus with diabetic peripheral angiopathy without gangrene: Secondary | ICD-10-CM | POA: Diagnosis present

## 2017-08-11 DIAGNOSIS — R000 Tachycardia, unspecified: Secondary | ICD-10-CM | POA: Diagnosis present

## 2017-08-11 DIAGNOSIS — R0789 Other chest pain: Secondary | ICD-10-CM | POA: Diagnosis present

## 2017-08-11 DIAGNOSIS — I480 Paroxysmal atrial fibrillation: Secondary | ICD-10-CM | POA: Diagnosis present

## 2017-08-11 DIAGNOSIS — J9811 Atelectasis: Secondary | ICD-10-CM | POA: Diagnosis not present

## 2017-08-11 DIAGNOSIS — Y831 Surgical operation with implant of artificial internal device as the cause of abnormal reaction of the patient, or of later complication, without mention of misadventure at the time of the procedure: Secondary | ICD-10-CM | POA: Diagnosis present

## 2017-08-11 DIAGNOSIS — K219 Gastro-esophageal reflux disease without esophagitis: Secondary | ICD-10-CM | POA: Diagnosis present

## 2017-08-11 DIAGNOSIS — I25118 Atherosclerotic heart disease of native coronary artery with other forms of angina pectoris: Secondary | ICD-10-CM | POA: Diagnosis not present

## 2017-08-11 DIAGNOSIS — E1143 Type 2 diabetes mellitus with diabetic autonomic (poly)neuropathy: Secondary | ICD-10-CM | POA: Diagnosis present

## 2017-08-11 DIAGNOSIS — Z6841 Body Mass Index (BMI) 40.0 and over, adult: Secondary | ICD-10-CM | POA: Diagnosis not present

## 2017-08-11 DIAGNOSIS — E8770 Fluid overload, unspecified: Secondary | ICD-10-CM | POA: Diagnosis not present

## 2017-08-11 DIAGNOSIS — R079 Chest pain, unspecified: Secondary | ICD-10-CM

## 2017-08-11 DIAGNOSIS — E8881 Metabolic syndrome: Secondary | ICD-10-CM | POA: Diagnosis present

## 2017-08-11 DIAGNOSIS — I200 Unstable angina: Secondary | ICD-10-CM | POA: Diagnosis not present

## 2017-08-11 DIAGNOSIS — R791 Abnormal coagulation profile: Secondary | ICD-10-CM | POA: Diagnosis present

## 2017-08-11 DIAGNOSIS — R002 Palpitations: Secondary | ICD-10-CM | POA: Diagnosis present

## 2017-08-11 DIAGNOSIS — I130 Hypertensive heart and chronic kidney disease with heart failure and stage 1 through stage 4 chronic kidney disease, or unspecified chronic kidney disease: Secondary | ICD-10-CM | POA: Diagnosis present

## 2017-08-11 DIAGNOSIS — E785 Hyperlipidemia, unspecified: Secondary | ICD-10-CM | POA: Diagnosis present

## 2017-08-11 DIAGNOSIS — T148XXA Other injury of unspecified body region, initial encounter: Secondary | ICD-10-CM | POA: Diagnosis not present

## 2017-08-11 DIAGNOSIS — E1122 Type 2 diabetes mellitus with diabetic chronic kidney disease: Secondary | ICD-10-CM | POA: Diagnosis present

## 2017-08-11 DIAGNOSIS — N183 Chronic kidney disease, stage 3 (moderate): Secondary | ICD-10-CM | POA: Diagnosis present

## 2017-08-11 DIAGNOSIS — I10 Essential (primary) hypertension: Secondary | ICD-10-CM | POA: Diagnosis not present

## 2017-08-11 DIAGNOSIS — I208 Other forms of angina pectoris: Secondary | ICD-10-CM | POA: Diagnosis not present

## 2017-08-11 DIAGNOSIS — D62 Acute posthemorrhagic anemia: Secondary | ICD-10-CM | POA: Diagnosis not present

## 2017-08-11 DIAGNOSIS — I2510 Atherosclerotic heart disease of native coronary artery without angina pectoris: Secondary | ICD-10-CM | POA: Diagnosis not present

## 2017-08-11 DIAGNOSIS — I454 Nonspecific intraventricular block: Secondary | ICD-10-CM | POA: Diagnosis present

## 2017-08-11 DIAGNOSIS — R072 Precordial pain: Secondary | ICD-10-CM | POA: Diagnosis not present

## 2017-08-11 DIAGNOSIS — Z7901 Long term (current) use of anticoagulants: Secondary | ICD-10-CM | POA: Diagnosis not present

## 2017-08-11 HISTORY — PX: LEFT HEART CATH AND CORONARY ANGIOGRAPHY: CATH118249

## 2017-08-11 LAB — CBC
HCT: 32.6 % — ABNORMAL LOW (ref 36.0–46.0)
Hemoglobin: 10.7 g/dL — ABNORMAL LOW (ref 12.0–15.0)
MCH: 28.1 pg (ref 26.0–34.0)
MCHC: 32.8 g/dL (ref 30.0–36.0)
MCV: 85.6 fL (ref 78.0–100.0)
Platelets: 217 10*3/uL (ref 150–400)
RBC: 3.81 MIL/uL — AB (ref 3.87–5.11)
RDW: 13.2 % (ref 11.5–15.5)
WBC: 8.9 10*3/uL (ref 4.0–10.5)

## 2017-08-11 LAB — BASIC METABOLIC PANEL
ANION GAP: 8 (ref 5–15)
BUN: 19 mg/dL (ref 6–20)
CALCIUM: 8.6 mg/dL — AB (ref 8.9–10.3)
CO2: 24 mmol/L (ref 22–32)
Chloride: 101 mmol/L (ref 101–111)
Creatinine, Ser: 1.15 mg/dL — ABNORMAL HIGH (ref 0.44–1.00)
GFR calc Af Amer: 56 mL/min — ABNORMAL LOW (ref >60)
GFR, EST NON AFRICAN AMERICAN: 48 mL/min — AB (ref >60)
Glucose, Bld: 219 mg/dL — ABNORMAL HIGH (ref 65–99)
POTASSIUM: 4.1 mmol/L (ref 3.5–5.1)
SODIUM: 133 mmol/L — AB (ref 135–145)

## 2017-08-11 LAB — HEPARIN LEVEL (UNFRACTIONATED)
HEPARIN UNFRACTIONATED: 0.16 [IU]/mL — AB (ref 0.30–0.70)
Heparin Unfractionated: <0.1 IU/mL — ABNORMAL LOW (ref 0.30–0.70)

## 2017-08-11 LAB — GLUCOSE, CAPILLARY
GLUCOSE-CAPILLARY: 213 mg/dL — AB (ref 65–99)
GLUCOSE-CAPILLARY: 111 mg/dL — AB (ref 65–99)
Glucose-Capillary: 165 mg/dL — ABNORMAL HIGH (ref 65–99)
Glucose-Capillary: 213 mg/dL — ABNORMAL HIGH (ref 65–99)

## 2017-08-11 LAB — PROTIME-INR
INR: 2.1
INR: 1.81
PROTHROMBIN TIME: 23.4 s — AB (ref 11.4–15.2)
PROTHROMBIN TIME: 20.8 s — AB (ref 11.4–15.2)

## 2017-08-11 SURGERY — LEFT HEART CATH AND CORONARY ANGIOGRAPHY
Anesthesia: LOCAL

## 2017-08-11 MED ORDER — CLOPIDOGREL BISULFATE 75 MG PO TABS
75.0000 mg | ORAL_TABLET | ORAL | Status: AC
  Administered 2017-08-11: 75 mg via ORAL
  Filled 2017-08-11: qty 1

## 2017-08-11 MED ORDER — IOPAMIDOL (ISOVUE-370) INJECTION 76%
INTRAVENOUS | Status: DC | PRN
  Administered 2017-08-11: 85 mL via INTRA_ARTERIAL

## 2017-08-11 MED ORDER — VERAPAMIL HCL 2.5 MG/ML IV SOLN
INTRAVENOUS | Status: DC | PRN
  Administered 2017-08-11: 10 mL via INTRA_ARTERIAL

## 2017-08-11 MED ORDER — INSULIN GLARGINE 100 UNIT/ML ~~LOC~~ SOLN
10.0000 [IU] | Freq: Every day | SUBCUTANEOUS | Status: DC
  Administered 2017-08-12 – 2017-08-16 (×3): 10 [IU] via SUBCUTANEOUS
  Filled 2017-08-11 (×5): qty 0.1

## 2017-08-11 MED ORDER — LIDOCAINE HCL (PF) 1 % IJ SOLN
INTRAMUSCULAR | Status: DC | PRN
  Administered 2017-08-11: 2 mL via INTRADERMAL

## 2017-08-11 MED ORDER — HEPARIN (PORCINE) IN NACL 100-0.45 UNIT/ML-% IJ SOLN
1100.0000 [IU]/h | INTRAMUSCULAR | Status: DC
  Administered 2017-08-12: 800 [IU]/h via INTRAVENOUS
  Administered 2017-08-12: 1000 [IU]/h via INTRAVENOUS
  Administered 2017-08-13 – 2017-08-16 (×4): 1100 [IU]/h via INTRAVENOUS
  Filled 2017-08-11 (×5): qty 250

## 2017-08-11 MED ORDER — MIDAZOLAM HCL 2 MG/2ML IJ SOLN
INTRAMUSCULAR | Status: AC
  Filled 2017-08-11: qty 2

## 2017-08-11 MED ORDER — SODIUM CHLORIDE 0.9 % IV SOLN: 250.0000 mL | INTRAVENOUS | Status: DC | PRN

## 2017-08-11 MED ORDER — HEPARIN SODIUM (PORCINE) 1000 UNIT/ML IJ SOLN
INTRAMUSCULAR | Status: DC | PRN
  Administered 2017-08-11: 6000 [IU] via INTRAVENOUS

## 2017-08-11 MED ORDER — SODIUM CHLORIDE 0.9% FLUSH
3.0000 mL | Freq: Two times a day (BID) | INTRAVENOUS | Status: DC
Start: 2017-08-12 — End: 2017-08-17
  Administered 2017-08-12 – 2017-08-16 (×7): 3 mL via INTRAVENOUS

## 2017-08-11 MED ORDER — IOPAMIDOL (ISOVUE-370) INJECTION 76%
INTRAVENOUS | Status: AC
  Filled 2017-08-11: qty 100

## 2017-08-11 MED ORDER — SODIUM CHLORIDE 0.9% FLUSH: 3.0000 mL | INTRAVENOUS | Status: DC | PRN

## 2017-08-11 MED ORDER — HEPARIN SODIUM (PORCINE) 1000 UNIT/ML IJ SOLN
INTRAMUSCULAR | Status: AC
  Filled 2017-08-11: qty 1

## 2017-08-11 MED ORDER — HEPARIN (PORCINE) IN NACL 2-0.9 UNIT/ML-% IJ SOLN
INTRAMUSCULAR | Status: AC | PRN
  Administered 2017-08-11: 1000 mL

## 2017-08-11 MED ORDER — VERAPAMIL HCL 2.5 MG/ML IV SOLN
INTRAVENOUS | Status: AC
  Filled 2017-08-11: qty 2

## 2017-08-11 MED ORDER — SODIUM CHLORIDE 0.9 % WEIGHT BASED INFUSION: 1.0000 mL/kg/h | INTRAVENOUS | Status: AC

## 2017-08-11 MED ORDER — HEPARIN (PORCINE) IN NACL 2-0.9 UNIT/ML-% IJ SOLN
INTRAMUSCULAR | Status: AC
  Filled 2017-08-11: qty 1000

## 2017-08-11 MED ORDER — FENTANYL CITRATE (PF) 100 MCG/2ML IJ SOLN
INTRAMUSCULAR | Status: DC | PRN
  Administered 2017-08-11: 25 ug via INTRAVENOUS

## 2017-08-11 MED ORDER — MIDAZOLAM HCL 2 MG/2ML IJ SOLN
INTRAMUSCULAR | Status: DC | PRN
  Administered 2017-08-11: 1 mg via INTRAVENOUS

## 2017-08-11 MED ORDER — LIDOCAINE HCL (PF) 1 % IJ SOLN
INTRAMUSCULAR | Status: AC
  Filled 2017-08-11: qty 30

## 2017-08-11 MED ORDER — FENTANYL CITRATE (PF) 100 MCG/2ML IJ SOLN
INTRAMUSCULAR | Status: AC
  Filled 2017-08-11: qty 2

## 2017-08-11 SURGICAL SUPPLY — 10 items

## 2017-08-11 NOTE — Progress Notes (Signed)
PROGRESS NOTE    [name]  HOZ:[id number] DOB: 07/12/1951 DOA: 08/09/2017 PCP: [name], MD   Outpatient Specialists:     Brief Narrative:  [name] is a 66 y.o. female with medical history significant of IDDM; OSA not on CPAP; CAD; PVD; afib on Coumadin; remote lung cancer; HTN; and HLD presenting with chest pain starting at about 12pm today.  She was sitting down when it started.  Substernal in nature.  The pain lasted about 2 minutes.  It resolved spontaneously.  After that she felt like she indigestion.  No SOB, diaphoresis.  She has had similar symptoms before when she had a clogged artery.  Last saw cardiology in May of this year. Last cath was in 2017  -Ost - Proximal LAD lesion, 80% and 99% in-stent restenosis. Post intervention with scoring balloon angioplasty followed by a Synergy DES stent covering the entire stented segment., there is a 0% residual stenosis. The lesion was previously treated with a 2 overlapping DES stents in 2013 and 2015. -Prox Cx lesion, 55% stenosed. Stable -Ramus lesion, 20% stenosed. -The left ventricular systolic function is normal. .  Once INR acceptable, plan is for heart cath   Assessment & Plan:   Principal Problem:   Chest pain Active Problems:   Essential hypertension   Paroxysmal atrial fibrillation   CAD s/p LAD DES 2013, 2015 and 2017   Dyslipidemia   Chronic anticoagulation   DM (diabetes mellitus), type 2 with peripheral vascular complications (HCC)   CKD (chronic kidney disease) stage 3, GFR 30-59 ml/min (HCC)   Chest pain -cardiology consult: plan for cath once INR stable  HTN -Takes Zestoretic and lopressor at home -Continue home meds  HLD -Change Zocor to Lipitor -LDL:71  DM -Last A1c was 9.2 in 4/17 -change 70/30 to lantus while here and NPO  PAF on Coumadin -Rate controlled with Lopressor -coumadin to heparin when INR <2  CKD -Appears to be relatively stable -Will follow  with BMP in AM      DVT prophylaxis:  Coumadin to heparin once INR < 2  Code Status: Full Code   Family Communication:   Disposition Plan:  After cath   Consultants:   cards    Subjective: No overnight events-- INR > 2 today  Objective: Vitals:   08/10/17 1932 08/11/17 0015 08/11/17 0531 08/11/17 0700  BP: (!) 122/46 (!) 116/40 (!) 158/54 (!) 149/45  Pulse: 74 74 87 73  Resp: 18 20 18    Temp: 97.9 F (36.6 C) 97.7 F (36.5 C) 98.1 F (36.7 C) 98 F (36.7 C)  TempSrc: Oral Oral Oral Oral  SpO2: 100% 95% 100% 97%  Weight:   125.2 kg (276 lb 1.6 oz)   Height:        Intake/Output Summary (Last 24 hours) at 08/11/2017 1151 Last data filed at 08/11/2017 0932 Gross per 24 hour  Intake 536.47 ml  Output 1250 ml  Net -713.53 ml   Filed Weights   08/10/17 1635 08/11/17 0531  Weight: 124.5 kg (274 lb 8 oz) 125.2 kg (276 lb 1.6 oz)    Examination:  General exam: in bed, NAD Respiratory system: Clear to auscultation. Respiratory effort normal. Cardiovascular system: rrr Gastrointestinal system: +BS, soft Central nervous system: alert Extremities: moves all 4 ext Skin: no rashes Psychiatry: mood normal    Data Reviewed: I have personally reviewed following labs and imaging studies  CBC: Recent Labs  Lab 08/09/17 1341 08/11/17 0228  WBC 8.4 8.9  HGB 12.7 10.7*  HCT 38.3 32.6*  MCV 86.5 85.6  PLT 275 509   Basic Metabolic Panel: Recent Labs  Lab 08/09/17 1341 08/11/17 0228  NA 133* 133*  K 4.4 4.1  CL 99* 101  CO2 27 24  GLUCOSE 256* 219*  BUN 16 19  CREATININE 1.10* 1.15*  CALCIUM 9.6 8.6*   GFR: Estimated Creatinine Clearance: 61.1 mL/min (A) (by C-G formula based on SCr of 1.15 mg/dL (H)). Liver Function Tests: No results for input(s): AST, ALT, ALKPHOS, BILITOT, PROT, ALBUMIN in the last 168 hours. No results for input(s): LIPASE, AMYLASE in the last 168 hours. No results for input(s): AMMONIA in the last 168  hours. Coagulation Profile: Recent Labs  Lab 08/09/17 1341 08/10/17 1629 08/11/17 0228  INR 2.38 1.86 2.10   Cardiac Enzymes: Recent Labs  Lab 08/10/17 1141 08/10/17 1629 08/10/17 2216  TROPONINI <0.03 <0.03 <0.03   BNP (last 3 results) No results for input(s): PROBNP in the last 8760 hours. HbA1C: Recent Labs    08/10/17 1141  HGBA1C 8.1*   CBG: Recent Labs  Lab 08/10/17 0852 08/10/17 1359 08/10/17 1635 08/10/17 2235 08/11/17 0719  GLUCAP 133* 188* 209* 151* 213*   Lipid Profile: Recent Labs    08/10/17 1141  CHOL 133  HDL 46  LDLCALC 71  TRIG 78  CHOLHDL 2.9   Thyroid Function Tests: Recent Labs    08/10/17 1141  TSH 1.992   Anemia Panel: No results for input(s): VITAMINB12, FOLATE, FERRITIN, TIBC, IRON, RETICCTPCT in the last 72 hours. Urine analysis:    Component Value Date/Time   COLORURINE YELLOW 12/21/2013 1129   APPEARANCEUR CLEAR 12/21/2013 1129   LABSPEC 1.014 12/21/2013 1129   PHURINE 6.5 12/21/2013 1129   GLUCOSEU NEGATIVE 12/21/2013 1129   HGBUR NEGATIVE 12/21/2013 1129   BILIRUBINUR NEGATIVE 12/21/2013 1129   KETONESUR NEGATIVE 12/21/2013 1129   PROTEINUR NEGATIVE 12/21/2013 1129   UROBILINOGEN 0.2 12/21/2013 1129   NITRITE NEGATIVE 12/21/2013 1129   LEUKOCYTESUR NEGATIVE 12/21/2013 1129    )No results found for this or any previous visit (from the past 240 hour(s)).    Anti-infectives (From admission, onward)   None       Radiology Studies: Dg Chest 2 View  Result Date: 08/09/2017 CLINICAL DATA:  Chest pain EXAM: CHEST  2 VIEW COMPARISON:  01/05/2016 FINDINGS: Low lung volumes. Mild cardiomegaly with central congestion. Mild increased opacity at the left lung base may partially relate to overlying soft tissue. Coronary stent. Aortic atherosclerosis. No pleural effusion. Surgical clips in the abdomen. IMPRESSION: 1. Low lung volumes with hazy opacity at the left lung base, likely augmented by overlying soft tissue. 2.  Cardiomegaly with mild central congestion. Electronically Signed   By: Donavan Foil M.D.   On: 08/09/2017 14:32        Scheduled Meds: . aspirin EC  81 mg Oral Daily  . atorvastatin  40 mg Oral q1800  . clopidogrel  75 mg Oral Daily  . lisinopril  20 mg Oral Daily   And  . hydrochlorothiazide  25 mg Oral Daily  . insulin aspart  0-15 Units Subcutaneous TID WC  . insulin aspart  0-5 Units Subcutaneous QHS  . [START ON 08/12/2017] insulin glargine  10 Units Subcutaneous QHS  . metoprolol tartrate  25 mg Oral BID  . pantoprazole  40 mg Oral Daily  . sodium chloride flush  3 mL Intravenous Q12H   Continuous Infusions: . sodium chloride    . sodium  chloride 1 mL/kg/hr (08/11/17 0732)  . heparin 800 Units/hr (08/10/17 2135)     LOS: 0 days    Time spent: 25 min    [place], DO [facility] Pager [phone]  If 7PM-7AM, please contact night-coverage [email or web address] Password [email or web address] 08/11/2017, 11:51 AM

## 2017-08-11 NOTE — Progress Notes (Signed)
Progress Note  Patient Name: [name] Date of Encounter: 08/11/2017  Primary Cardiologist:  Dr. Sallyanne Kuster  Subjective   No chest pain  Inpatient Medications    Scheduled Meds: . aspirin EC  81 mg Oral Daily  . atorvastatin  40 mg Oral q1800  . clopidogrel  75 mg Oral Daily  . lisinopril  20 mg Oral Daily   And  . hydrochlorothiazide  25 mg Oral Daily  . insulin aspart  0-15 Units Subcutaneous TID WC  . insulin aspart  0-5 Units Subcutaneous QHS  . insulin glargine  20 Units Subcutaneous Daily  . metoprolol tartrate  25 mg Oral BID  . pantoprazole  40 mg Oral Daily  . sodium chloride flush  3 mL Intravenous Q12H   Continuous Infusions: . sodium chloride    . sodium chloride 1 mL/kg/hr (08/11/17 0732)  . heparin 800 Units/hr (08/10/17 2135)   PRN Meds: sodium chloride, acetaminophen, gi cocktail, ondansetron (ZOFRAN) IV, sodium chloride flush   Vital Signs    Vitals:   08/10/17 1635 08/10/17 1932 08/11/17 0015 08/11/17 0531  BP: (!) 135/52 (!) 122/46 (!) 116/40 (!) 158/54  Pulse: 64 74 74 87  Resp: 18 18 20 18   Temp: 97.6 F (36.4 C) 97.9 F (36.6 C) 97.7 F (36.5 C) 98.1 F (36.7 C)  TempSrc: Oral Oral Oral Oral  SpO2: 100% 100% 95% 100%  Weight: 274 lb 8 oz (124.5 kg)   276 lb 1.6 oz (125.2 kg)  Height: 5\' 3"  (1.6 m)       Intake/Output Summary (Last 24 hours) at 08/11/2017 0742 Last data filed at 08/11/2017 0543 Gross per 24 hour  Intake 533.47 ml  Output 700 ml  Net -166.53 ml   Filed Weights   08/10/17 1635 08/11/17 0531  Weight: 274 lb 8 oz (124.5 kg) 276 lb 1.6 oz (125.2 kg)    Telemetry    NSR 08/11/2017  - Personally Reviewed  ECG    N/a today  Physical Exam  BP (!) 149/45 (BP Location: Left Arm)   Pulse 73   Temp 98 F (36.7 C) (Oral)   Resp 18   Ht 5\' 3"  (1.6 m)   Wt 276 lb 1.6 oz (125.2 kg)   SpO2 97%   BMI 48.91 kg/m   GEN: Obese black female Neck: No JVD Cardiac: RRR, no murmurs, rubs, or gallops.    Respiratory: Clear to auscultation bilaterally. GI: Soft, nontender, non-distended  MS: No edema; No deformity. Neuro:  Nonfocal  Psych: Normal affect   Labs    Chemistry Recent Labs  Lab 08/09/17 1341 08/11/17 0228  NA 133* 133*  K 4.4 4.1  CL 99* 101  CO2 27 24  GLUCOSE 256* 219*  BUN 16 19  CREATININE 1.10* 1.15*  CALCIUM 9.6 8.6*  GFRNONAA 51* 48*  GFRAA 59* 56*  ANIONGAP 7 8     Hematology Recent Labs  Lab 08/09/17 1341 08/11/17 0228  WBC 8.4 8.9  RBC 4.43 3.81*  HGB 12.7 10.7*  HCT 38.3 32.6*  MCV 86.5 85.6  MCH 28.7 28.1  MCHC 33.2 32.8  RDW 13.3 13.2  PLT 275 217    Cardiac Enzymes Recent Labs  Lab 08/10/17 1141 08/10/17 1629 08/10/17 2216  TROPONINI <0.03 <0.03 <0.03    Recent Labs  Lab 08/09/17 1354  TROPIPOC 0.00     BNPNo results for input(s): BNP, PROBNP in the last 168 hours.   DDimer No results for input(s): DDIMER in  the last 168 hours.   Radiology    Dg Chest 2 View  Result Date: 08/09/2017 CLINICAL DATA:  Chest pain EXAM: CHEST  2 VIEW COMPARISON:  01/05/2016 FINDINGS: Low lung volumes. Mild cardiomegaly with central congestion. Mild increased opacity at the left lung base may partially relate to overlying soft tissue. Coronary stent. Aortic atherosclerosis. No pleural effusion. Surgical clips in the abdomen. IMPRESSION: 1. Low lung volumes with hazy opacity at the left lung base, likely augmented by overlying soft tissue. 2. Cardiomegaly with mild central congestion. Electronically Signed   By: Donavan Foil M.D.   On: 08/09/2017 14:32    Cardiac Studies   LHC 02/05/2016: 1. Ost - Proximal LAD lesion, 80% and 99% in-stent restenosis. Post intervention with scoring balloon angioplasty followed by a Synergy DES stent covering the entire stented segment., there is a 0% residual stenosis. The lesion was previously treated with a 2 overlapping DES stents in 2013 and 2015. 2. Prox Cx lesion, 55% stenosed. Stable 3. Ramus lesion,  20% stenosed. 4. The left ventricular systolic function is normal Recommendation for lifelong Plavix due to 2 episodes of instent restenosis.  Patient Profile     [name] is a 66 y.o. female with significant PMH of CAD s/p LAD stent with 2 episodes of re-stenosis, HTN, HLD, T2DM, GERD with esophageal stricture presenting to [facility] with chest pain similar to prior episodes of stent re-stenosis; she has had negative troponins and an unchanged EKG from priors.  Assessment & Plan    Chest pain: Troponins negative x3. Cath today; Cr. 1.15, INR 2.1, hgb 10.7. Will repeat INR prior to cath. She should be able to be done radially  Hold plavix this am in case she needs LIMA  CAD s/p prox LAD stent with prior in-stent restenosis: On plavix 75mg  daily - lifelong therapy; lisinopril 20mg  daily, metoprolol 25mg  BID  PAF: In sinus rhythm currently. On warfarin for anticoagulation; will change to heparin when INR <2 for cath - pharmacy dosing.  HTN: Continue current regimen.  T2DM: A1c 8.1 this admission. On insulin.  CKD stage 2/3: Stable.  For questions or updates, please contact [place] Please consult [email or web address] for contact info under Cardiology/STEMI.   Signed, Alphonzo Grieve, MD  08/11/2017, 7:42 AM

## 2017-08-11 NOTE — Interval H&P Note (Signed)
History and Physical Interval Note:  08/11/2017 3:41 PM  [name]  has presented today for surgery, with the diagnosis of cp  The various methods of treatment have been discussed with the patient and family. After consideration of risks, benefits and other options for treatment, the patient has consented to  Procedure(s): LEFT HEART CATH AND CORONARY ANGIOGRAPHY (N/A) as a surgical intervention .  The patient's history has been reviewed, patient examined, no change in status, stable for surgery.  I have reviewed the patient's chart and labs.  Questions were answered to the patient's satisfaction.    Cath Lab Visit (complete for each Cath Lab visit)  Clinical Evaluation Leading to the Procedure:   ACS: Yes.    Non-ACS:    Anginal Classification: CCS III  Anti-ischemic medical therapy: Minimal Therapy (1 class of medications)  Non-Invasive Test Results: No non-invasive testing performed  Prior CABG: No previous CABG       [name] [facility] 08/11/2017 3:41 PM

## 2017-08-11 NOTE — Progress Notes (Signed)
Chaplain Note:  Consult for addressing Advance Directive. Pt. Indicated she waiting to be taken for a Cardiac Cath. And requested someone return tomorrow to discuss and assist.  I have passed on this referral to a chaplain working tomorrow for follow up.   [name] [phone]

## 2017-08-11 NOTE — Consult Note (Signed)
   [facility] CM Inpatient Consult   08/11/2017  [name] Apr 01, 1951 [id number]   Met with the patient who is in the [place].  Noted that the [facility] Pharmacist has attempted outreaches regarding medication adherence follow up.  Patient states her primary care provider is Dr. [name]. This provider is listed to provide the post hospital follow up needs.   She states she and her son [name] live together.  She states she does not cook and eats out all of the time because she has to use a walker and she doesn't do anything in the kitchen because of her knees.    She denies issues with getting to appointments as she states she drives herself.  She denies any current medication needs or issues. Patient did accept the brochure, 24 hour nurse advise line magnet,  and contact information.   For questions, please contact:  Natividad Brood, RN BSN [facility] Liaison  [phone] business mobile phone Toll free office [phone]

## 2017-08-11 NOTE — Progress Notes (Signed)
ANTICOAGULATION CONSULT NOTE - Initial Consult  Pharmacy Consult for Heparin Indication: ACS, Restart 8 hours post sheath  Allergies  Allergen Reactions  . Levofloxacin Itching  . Morphine And Related Itching    Patient Measurements: Height: 5\' 3"  (160 cm) Weight: 276 lb 1.6 oz (125.2 kg) IBW/kg (Calculated) : 52.4 Heparin Dosing Weight: 83.2 kg  Vital Signs: Temp: 98.2 F (36.8 C) (11/15 1955) Temp Source: Oral (11/15 1955) BP: 138/53 (11/15 2202) Pulse Rate: 73 (11/15 2202)  Labs: Recent Labs    08/09/17 1341 08/10/17 1141 08/10/17 1629 08/10/17 2216 08/11/17 0228 08/11/17 1322  HGB 12.7  --   --   --  10.7*  --   HCT 38.3  --   --   --  32.6*  --   PLT 275  --   --   --  217  --   LABPROT 25.8*  --  21.3*  --  23.4* 20.8*  INR 2.38  --  1.86  --  2.10 1.81  HEPARINUNFRC  --   --   --   --  <0.10* 0.16*  CREATININE 1.10*  --   --   --  1.15*  --   TROPONINI  --  <0.03 <0.03 <0.03  --   --     Estimated Creatinine Clearance: 61.1 mL/min (A) (by C-G formula based on SCr of 1.15 mg/dL (H)).   Medical History: Past Medical History:  Diagnosis Date  . Arthritis    "knees" (02/05/2016)  . Cancer ([place]) 5/10   [facility]   . Coronary artery disease   . GERD (gastroesophageal reflux disease)    tx meds  . H/O hiatal hernia   . Heart palpitations    monitor 04/19/12-05/10/12- frequent PAF with RVR- coumadin started  . History of blood transfusion "several"   last april 2015 s/p knee surgery (02/05/2016)  . Hypercholesteremia   . Hypertension   . Lung cancer ([place])    2010, surgery 18% left lung no radiation or chemo  . Non-STEMI (non-ST elevated myocardial infarction) ([place]) 04/07/2012   See cath results below  . Paroxysmal atrial fibrillation ([place]) 04/07/2012   On Warfarin  . Peripheral vascular disease ([place]) 8/12   Lt SFA PTA  . Presence of stent in LAD coronary artery 04/07/2012   Xience Expedition DES 2.75 mm x 18 mm (dilated to 3.0 mm)  . Sleep apnea    does not wear CPAP  . Type II diabetes mellitus (HCC)    insulin dependent    Assessment: 66 year old female s/p cardiac cath with plan for possible CABG to restart IV heparin 8 hours post-sheath removal. Sheath was removed at 16:22 PM. TR band in place but down to 1cc of air. Will plan to resume IV heparin at 00:30PM (~8hrs) post sheath. Discussed with RN to communicate with pharmacy if any bleeding.   Goal of Therapy:  Heparin level 0.3-0.7 units/ml Monitor platelets by anticoagulation protocol: Yes   Plan:  Restart IV Heparin at 800 units/hr at 00:30 AM - no bolus with recent procedure.  Recheck Heparin level 6 hours post restart.  Daily heparin level and CBC while on therapy.   Sloan Leiter, PharmD, BCPS, BCCCP Clinical Pharmacist Clinical phone 08/11/2017 until 11PM [phone] After hours, please call #[phone] 08/11/2017,10:18 PM

## 2017-08-11 NOTE — Progress Notes (Signed)
Thank you, Elliot Dally

## 2017-08-11 NOTE — H&P (View-Only) (Signed)
Progress Note  Patient Name: [name] Date of Encounter: 08/11/2017  Primary Cardiologist:  Dr. Sallyanne Kuster  Subjective   No chest pain  Inpatient Medications    Scheduled Meds: . aspirin EC  81 mg Oral Daily  . atorvastatin  40 mg Oral q1800  . clopidogrel  75 mg Oral Daily  . lisinopril  20 mg Oral Daily   And  . hydrochlorothiazide  25 mg Oral Daily  . insulin aspart  0-15 Units Subcutaneous TID WC  . insulin aspart  0-5 Units Subcutaneous QHS  . insulin glargine  20 Units Subcutaneous Daily  . metoprolol tartrate  25 mg Oral BID  . pantoprazole  40 mg Oral Daily  . sodium chloride flush  3 mL Intravenous Q12H   Continuous Infusions: . sodium chloride    . sodium chloride 1 mL/kg/hr (08/11/17 0732)  . heparin 800 Units/hr (08/10/17 2135)   PRN Meds: sodium chloride, acetaminophen, gi cocktail, ondansetron (ZOFRAN) IV, sodium chloride flush   Vital Signs    Vitals:   08/10/17 1635 08/10/17 1932 08/11/17 0015 08/11/17 0531  BP: (!) 135/52 (!) 122/46 (!) 116/40 (!) 158/54  Pulse: 64 74 74 87  Resp: 18 18 20 18   Temp: 97.6 F (36.4 C) 97.9 F (36.6 C) 97.7 F (36.5 C) 98.1 F (36.7 C)  TempSrc: Oral Oral Oral Oral  SpO2: 100% 100% 95% 100%  Weight: 274 lb 8 oz (124.5 kg)   276 lb 1.6 oz (125.2 kg)  Height: 5\' 3"  (1.6 m)       Intake/Output Summary (Last 24 hours) at 08/11/2017 0742 Last data filed at 08/11/2017 0543 Gross per 24 hour  Intake 533.47 ml  Output 700 ml  Net -166.53 ml   Filed Weights   08/10/17 1635 08/11/17 0531  Weight: 274 lb 8 oz (124.5 kg) 276 lb 1.6 oz (125.2 kg)    Telemetry    NSR 08/11/2017  - Personally Reviewed  ECG    N/a today  Physical Exam  BP (!) 149/45 (BP Location: Left Arm)   Pulse 73   Temp 98 F (36.7 C) (Oral)   Resp 18   Ht 5\' 3"  (1.6 m)   Wt 276 lb 1.6 oz (125.2 kg)   SpO2 97%   BMI 48.91 kg/m   GEN: Obese black female Neck: No JVD Cardiac: RRR, no murmurs, rubs, or gallops.    Respiratory: Clear to auscultation bilaterally. GI: Soft, nontender, non-distended  MS: No edema; No deformity. Neuro:  Nonfocal  Psych: Normal affect   Labs    Chemistry Recent Labs  Lab 08/09/17 1341 08/11/17 0228  NA 133* 133*  K 4.4 4.1  CL 99* 101  CO2 27 24  GLUCOSE 256* 219*  BUN 16 19  CREATININE 1.10* 1.15*  CALCIUM 9.6 8.6*  GFRNONAA 51* 48*  GFRAA 59* 56*  ANIONGAP 7 8     Hematology Recent Labs  Lab 08/09/17 1341 08/11/17 0228  WBC 8.4 8.9  RBC 4.43 3.81*  HGB 12.7 10.7*  HCT 38.3 32.6*  MCV 86.5 85.6  MCH 28.7 28.1  MCHC 33.2 32.8  RDW 13.3 13.2  PLT 275 217    Cardiac Enzymes Recent Labs  Lab 08/10/17 1141 08/10/17 1629 08/10/17 2216  TROPONINI <0.03 <0.03 <0.03    Recent Labs  Lab 08/09/17 1354  TROPIPOC 0.00     BNPNo results for input(s): BNP, PROBNP in the last 168 hours.   DDimer No results for input(s): DDIMER in  the last 168 hours.   Radiology    Dg Chest 2 View  Result Date: 08/09/2017 CLINICAL DATA:  Chest pain EXAM: CHEST  2 VIEW COMPARISON:  01/05/2016 FINDINGS: Low lung volumes. Mild cardiomegaly with central congestion. Mild increased opacity at the left lung base may partially relate to overlying soft tissue. Coronary stent. Aortic atherosclerosis. No pleural effusion. Surgical clips in the abdomen. IMPRESSION: 1. Low lung volumes with hazy opacity at the left lung base, likely augmented by overlying soft tissue. 2. Cardiomegaly with mild central congestion. Electronically Signed   By: Donavan Foil M.D.   On: 08/09/2017 14:32    Cardiac Studies   LHC 02/05/2016: 1. Ost - Proximal LAD lesion, 80% and 99% in-stent restenosis. Post intervention with scoring balloon angioplasty followed by a Synergy DES stent covering the entire stented segment., there is a 0% residual stenosis. The lesion was previously treated with a 2 overlapping DES stents in 2013 and 2015. 2. Prox Cx lesion, 55% stenosed. Stable 3. Ramus lesion,  20% stenosed. 4. The left ventricular systolic function is normal Recommendation for lifelong Plavix due to 2 episodes of instent restenosis.  Patient Profile     [name] is a 66 y.o. female with significant PMH of CAD s/p LAD stent with 2 episodes of re-stenosis, HTN, HLD, T2DM, GERD with esophageal stricture presenting to [facility] with chest pain similar to prior episodes of stent re-stenosis; she has had negative troponins and an unchanged EKG from priors.  Assessment & Plan    Chest pain: Troponins negative x3. Cath today; Cr. 1.15, INR 2.1, hgb 10.7. Will repeat INR prior to cath. She should be able to be done radially  Hold plavix this am in case she needs LIMA  CAD s/p prox LAD stent with prior in-stent restenosis: On plavix 75mg  daily - lifelong therapy; lisinopril 20mg  daily, metoprolol 25mg  BID  PAF: In sinus rhythm currently. On warfarin for anticoagulation; will change to heparin when INR <2 for cath - pharmacy dosing.  HTN: Continue current regimen.  T2DM: A1c 8.1 this admission. On insulin.  CKD stage 2/3: Stable.  For questions or updates, please contact [place] Please consult [email or web address] for contact info under Cardiology/STEMI.   Signed, Alphonzo Grieve, MD  08/11/2017, 7:42 AM

## 2017-08-11 NOTE — Progress Notes (Signed)
Inpatient Diabetes Program Recommendations  AACE/ADA: New Consensus Statement on Inpatient Glycemic Control (2015)  Target Ranges:  Prepandial:   less than 140 mg/dL      Peak postprandial:   less than 180 mg/dL (1-2 hours)      Critically ill patients:  140 - 180 mg/dL   Results for VINETTA, BRACH (MRN [id number]) as of 08/11/2017 10:13  Ref. Range 08/10/2017 08:52 08/10/2017 13:59 08/10/2017 16:35 08/10/2017 22:35 08/11/2017 07:19  Glucose-Capillary Latest Ref Range: 65 - 99 mg/dL 133 (H) 188 (H) 209 (H) 151 (H) 213 (H)   Review of Glycemic Control  Diabetes history: DM2 Outpatient Diabetes medications: 70/30 42 units QAM, 70/30 20 units QPM, Amaryl 2 mg daily Current orders for Inpatient glycemic control: Lantus 20 units daily, Novolog 0-15 units TID with meals, Novolog 0-5 units QHS  Inpatient Diabetes Program Recommendations: Insulin - Basal: In reviewing chart, noted patient has NOT received any Lantus since being admitted. Fasting glucose 213 mg/dl today. Please consider decreasing Lantus to 10 units and change administration time to bedtime.   NOTE: Patient is currently NPO for heart cath this afternoon. Fasting glucose 213 mg/dl this morning. No basal insulin has been given since admitted. Talked with Ander Purpura, RN regarding current insulin orders. Informed [name], RN that text page would be sent to Dr. Eliseo Squires to ask to decrease Lantus to 10 units and to change administration time to bedtime.  Thanks, Barnie Alderman, RN, MSN, CDE Diabetes Coordinator Inpatient Diabetes Program [phone] (Team Pager from 8am to 5pm)

## 2017-08-12 ENCOUNTER — Encounter (HOSPITAL_COMMUNITY): Payer: Self-pay | Admitting: Cardiology

## 2017-08-12 ENCOUNTER — Inpatient Hospital Stay (HOSPITAL_COMMUNITY): Payer: Medicare HMO

## 2017-08-12 DIAGNOSIS — I2510 Atherosclerotic heart disease of native coronary artery without angina pectoris: Secondary | ICD-10-CM

## 2017-08-12 DIAGNOSIS — R072 Precordial pain: Secondary | ICD-10-CM

## 2017-08-12 DIAGNOSIS — T148XXA Other injury of unspecified body region, initial encounter: Secondary | ICD-10-CM

## 2017-08-12 DIAGNOSIS — T82855A Stenosis of coronary artery stent, initial encounter: Secondary | ICD-10-CM

## 2017-08-12 DIAGNOSIS — I200 Unstable angina: Secondary | ICD-10-CM

## 2017-08-12 LAB — HEPARIN LEVEL (UNFRACTIONATED)
Heparin Unfractionated: 0.17 IU/mL — ABNORMAL LOW (ref 0.30–0.70)
Heparin Unfractionated: 0.24 IU/mL — ABNORMAL LOW (ref 0.30–0.70)

## 2017-08-12 LAB — PULMONARY FUNCTION TEST
DL/VA % pred: 113 %
DL/VA: 5.32 ml/min/mmHg/L
DLCO cor % pred: 61 %
DLCO cor: 14.13 ml/min/mmHg
DLCO unc % pred: 56 %
DLCO unc: 12.97 ml/min/mmHg
FEF 25-75 Post: 0.69 L/sec
FEF 25-75 Pre: 1.34 L/sec
FEF2575-%Change-Post: -48 %
FEF2575-%Pred-Post: 40 %
FEF2575-%Pred-Pre: 78 %
FEV1-%Change-Post: -17 %
FEV1-%Pred-Post: 60 %
FEV1-%Pred-Pre: 73 %
FEV1-Post: 1.09 L
FEV1-Pre: 1.32 L
FEV1FVC-%Change-Post: -5 %
FEV1FVC-%Pred-Pre: 107 %
FEV6-%Change-Post: -12 %
FEV6-%Pred-Post: 61 %
FEV6-%Pred-Pre: 70 %
FEV6-Post: 1.38 L
FEV6-Pre: 1.57 L
FEV6FVC-%Pred-Post: 104 %
FEV6FVC-%Pred-Pre: 104 %
FVC-%Change-Post: -12 %
FVC-%Pred-Post: 59 %
FVC-%Pred-Pre: 67 %
FVC-Post: 1.38 L
FVC-Pre: 1.57 L
Post FEV1/FVC ratio: 79 %
Post FEV6/FVC ratio: 100 %
Pre FEV1/FVC ratio: 84 %
Pre FEV6/FVC Ratio: 100 %

## 2017-08-12 LAB — CBC
HCT: 32.8 % — ABNORMAL LOW (ref 36.0–46.0)
HEMOGLOBIN: 11 g/dL — AB (ref 12.0–15.0)
MCH: 28.5 pg (ref 26.0–34.0)
MCHC: 33.5 g/dL (ref 30.0–36.0)
MCV: 85 fL (ref 78.0–100.0)
Platelets: 217 10*3/uL (ref 150–400)
RBC: 3.86 MIL/uL — AB (ref 3.87–5.11)
RDW: 13.1 % (ref 11.5–15.5)
WBC: 8.2 10*3/uL (ref 4.0–10.5)

## 2017-08-12 LAB — GLUCOSE, CAPILLARY
GLUCOSE-CAPILLARY: 138 mg/dL — AB (ref 65–99)
GLUCOSE-CAPILLARY: 250 mg/dL — AB (ref 65–99)
GLUCOSE-CAPILLARY: 134 mg/dL — AB (ref 65–99)
Glucose-Capillary: 236 mg/dL — ABNORMAL HIGH (ref 65–99)
Glucose-Capillary: 175 mg/dL — ABNORMAL HIGH (ref 65–99)

## 2017-08-12 LAB — PROTIME-INR
INR: 1.59
Prothrombin Time: 18.8 seconds — ABNORMAL HIGH (ref 11.4–15.2)

## 2017-08-12 MED ORDER — ALBUTEROL SULFATE (2.5 MG/3ML) 0.083% IN NEBU
2.5000 mg | INHALATION_SOLUTION | Freq: Once | RESPIRATORY_TRACT | Status: AC
Start: 2017-08-12 — End: 2017-08-12
  Administered 2017-08-12: 2.5 mg via RESPIRATORY_TRACT

## 2017-08-12 NOTE — Progress Notes (Signed)
Responding to [place] for Advanced Directive for this patient.  Patient is asking if she can wait/postpone completing right now.  Her surgery/procedure has been postponed until next week and she would like to wait through the weekend before completing HCPOA.  I am happy to assist patient with the completion of AD and once it is completed, please place consult or page [place] and we will provide notary.    08/12/17 1314  Clinical Encounter Type  Visited With Patient  Visit Type Follow-up;Psychological support;Spiritual support  Spiritual Encounters  Spiritual Needs Literature

## 2017-08-12 NOTE — Progress Notes (Signed)
PROGRESS NOTE  [name] QMV:[id number] DOB: 1950-11-06 DOA: 08/09/2017 PCP: [name], MD  HPI/Recap of past 24 hours: Brandi A Farringtonis a 66 y.o.femalewith significant PMH of CAD s/p LAD stent with 2 episodes of re-stenosis, HTN, HLD, T2DM, GERD with esophageal stricture presents with chest pain similar to prior episodes of stent re-stenosis. Negative troponins and unchanged EKG from priors. Cardiology consulted, s/p left cath with coronary angiography 08/11/17. Cardiology recommends CTS evaluation for possible CABG. Holding off coumadin and plavix due to possible surgical intervention on Monday or Tuesday.  This morning patient had no complaints. However early this afternoon patient complained of swelling in her right upper arm that appears larger. Possible hematoma at site of previous venous puncture. Duplex U/S ordered to r/o DVT. Currently on heparin drip and will continue.    Assessment/Plan: Principal Problem:   Chest pain Active Problems:   Essential hypertension   Paroxysmal atrial fibrillation   Unstable angina (HCC)   CAD s/p LAD DES 2013, 2015 and 2017   Dyslipidemia   Chronic anticoagulation   DM (diabetes mellitus), type 2 with peripheral vascular complications (HCC)   CKD (chronic kidney disease) stage 3, GFR 30-59 ml/min (HCC)   Chest pain r/o ACS -ACS ruled in -cardiology following -s/p left cath and coronary angiography 08/11/17 -Single vessel obstructive CAD with severe in stent restenosis in the proximal LAD -Normal LV function -Normal LVEDP -patient has recurrent restenosis in a large proximal LAD which has been stented multiple times in the past. -Cardiology recommends CTS to evaluate for CABG.  -Will hold Plavix and coumadin. Resume IV heparin. -Cardio will consult CT surgery.  CAD s/p prox LAD stent with recurrent in-stent restenosis: -hold plavix 75mg  daily and coumadin due to possible surgical intervention CABG -continue  lisinopril 20mg  daily, metoprolol 25mg  BID  Paroxismal A-fib -sinus rhythm currently -previously on warfarin for anticoagulation; changed to heparin due to possible surgical intervention on Monday or Tuesday  Subtherapeutic INR -INR 1.59 -continue heparin drip for possible CABG on Monday or tuesday  Hyponatremia -mild Na+ 133 -BMP am  HTN: -Stable -Continue lisinopril, HCTZ, metoprolol  T2DM: -A1c 8.1 this admission. - insulin.  CKD stage 2/3: Stable.   Code Status: Full  Family Communication: No family members at bedside  Disposition Plan: Will stay another midnight to continue close monitoring and heparin infusion with anticipation of possible CABG on Monday.   Consultants:  Cardiology  Procedures:  Left cardiac cath through right radial access  Antimicrobials:  none  DVT prophylaxis:  heparin drip   Objective: Vitals:   08/12/17 0043 08/12/17 0100 08/12/17 0135 08/12/17 0419  BP: (!) 137/50 (!) 137/58 (!) 143/57 (!) 147/70  Pulse: 67 64 68 79  Resp: 18   18  Temp: 98.2 F (36.8 C)   98.4 F (36.9 C)  TempSrc: Oral   Oral  SpO2: 100%   100%  Weight:    126.6 kg (279 lb 1.6 oz)  Height:        Intake/Output Summary (Last 24 hours) at 08/12/2017 0721 Last data filed at 08/12/2017 0600 Gross per 24 hour  Intake 1869.27 ml  Output 2350 ml  Net -480.73 ml   Filed Weights   08/10/17 1635 08/11/17 0531 08/12/17 0419  Weight: 124.5 kg (274 lb 8 oz) 125.2 kg (276 lb 1.6 oz) 126.6 kg (279 lb 1.6 oz)    Exam:   General:  66 yo AAF WD WN in NAD   Cardiovascular: RRR with no rubs or  gallops   Respiratory: CTA with no wheezes or rales  Abdomen: soft obese NT ND with NBS x 4 quadrants  Musculoskeletal: moves all limbs. Right upper arm hematoma from previous venous puncture site. Right radial access appears clean with no drainage noted  Skin: bruising noted from venous puncture sites and hematoma as stated above  Psychiatry: Mood is  anxious   Data Reviewed: CBC: Recent Labs  Lab 08/09/17 1341 08/11/17 0228 08/12/17 0450  WBC 8.4 8.9 8.2  HGB 12.7 10.7* 11.0*  HCT 38.3 32.6* 32.8*  MCV 86.5 85.6 85.0  PLT 275 217 749   Basic Metabolic Panel: Recent Labs  Lab 08/09/17 1341 08/11/17 0228  NA 133* 133*  K 4.4 4.1  CL 99* 101  CO2 27 24  GLUCOSE 256* 219*  BUN 16 19  CREATININE 1.10* 1.15*  CALCIUM 9.6 8.6*   GFR: Estimated Creatinine Clearance: 61.5 mL/min (A) (by C-G formula based on SCr of 1.15 mg/dL (H)). Liver Function Tests: No results for input(s): AST, ALT, ALKPHOS, BILITOT, PROT, ALBUMIN in the last 168 hours. No results for input(s): LIPASE, AMYLASE in the last 168 hours. No results for input(s): AMMONIA in the last 168 hours. Coagulation Profile: Recent Labs  Lab 08/09/17 1341 08/10/17 1629 08/11/17 0228 08/11/17 1322 08/12/17 0450  INR 2.38 1.86 2.10 1.81 1.59   Cardiac Enzymes: Recent Labs  Lab 08/10/17 1141 08/10/17 1629 08/10/17 2216  TROPONINI <0.03 <0.03 <0.03   BNP (last 3 results) No results for input(s): PROBNP in the last 8760 hours. HbA1C: Recent Labs    08/10/17 1141  HGBA1C 8.1*   CBG: Recent Labs  Lab 08/11/17 0719 08/11/17 1203 08/11/17 1709 08/11/17 2135 08/12/17 0710  GLUCAP 213* 165* 111* 213* 138*   Lipid Profile: Recent Labs    08/10/17 1141  CHOL 133  HDL 46  LDLCALC 71  TRIG 78  CHOLHDL 2.9   Thyroid Function Tests: Recent Labs    08/10/17 1141  TSH 1.992   Anemia Panel: No results for input(s): VITAMINB12, FOLATE, FERRITIN, TIBC, IRON, RETICCTPCT in the last 72 hours. Urine analysis:    Component Value Date/Time   COLORURINE YELLOW 12/21/2013 1129   APPEARANCEUR CLEAR 12/21/2013 1129   LABSPEC 1.014 12/21/2013 1129   PHURINE 6.5 12/21/2013 1129   GLUCOSEU NEGATIVE 12/21/2013 1129   HGBUR NEGATIVE 12/21/2013 1129   BILIRUBINUR NEGATIVE 12/21/2013 1129   KETONESUR NEGATIVE 12/21/2013 1129   PROTEINUR NEGATIVE  12/21/2013 1129   UROBILINOGEN 0.2 12/21/2013 1129   NITRITE NEGATIVE 12/21/2013 1129   LEUKOCYTESUR NEGATIVE 12/21/2013 1129   Sepsis Labs: @LABRCNTIP (procalcitonin:4,lacticidven:4)  )No results found for this or any previous visit (from the past 240 hour(s)).    Studies: No results found.  Scheduled Meds: . aspirin EC  81 mg Oral Daily  . atorvastatin  40 mg Oral q1800  . lisinopril  20 mg Oral Daily   And  . hydrochlorothiazide  25 mg Oral Daily  . insulin aspart  0-15 Units Subcutaneous TID WC  . insulin aspart  0-5 Units Subcutaneous QHS  . insulin glargine  10 Units Subcutaneous QHS  . metoprolol tartrate  25 mg Oral BID  . pantoprazole  40 mg Oral Daily  . sodium chloride flush  3 mL Intravenous Q12H    Continuous Infusions: . sodium chloride    . heparin 800 Units/hr (08/12/17 0216)     LOS: 1 day     Kayleen Memos, MD [facility] Pager [phone]  If 7PM-7AM, please  contact night-coverage [email or web address] Password [email or web address] 08/12/2017, 7:21 AM

## 2017-08-12 NOTE — Progress Notes (Signed)
1 Day Post-Op Procedure(s) (LRB): LEFT HEART CATH AND CORONARY ANGIOGRAPHY (N/A) Subjective: Patient examined, coronary angiogram images personally reviewed and discussed with patient Very nice 66 year old morbidly obese diabetic female with history of PCI to the LAD on 3 separate occasions.  Now with severe in-stent stenosis and recurrent angina.  LV function appears to be preserved by echocardiogram is pending.  Patient is status post left thoracotomy for wedge.  Resection of lung cancer by Dr. Arlyce Dice in 2010.  Her PFTs show adequate pulmonary reserve for sternotomy.  CT scan of chest and echocardiogram are pending.  Would recommend Plavix washout, keeping patient in hospital on heparin.  P2 Y 12 platelet assay has been ordered.  Objective: Vital signs in last 24 hours: Temp:  [97.6 F (36.4 C)-98.4 F (36.9 C)] 98.3 F (36.8 C) (11/16 2035) Pulse Rate:  [64-81] 73 (11/16 2035) Cardiac Rhythm: Normal sinus rhythm (11/16 2007) Resp:  [18] 18 (11/16 2035) BP: (133-157)/(45-74) 138/47 (11/16 2035) SpO2:  [99 %-100 %] 99 % (11/16 2035) Weight:  [279 lb 1.6 oz (126.6 kg)] 279 lb 1.6 oz (126.6 kg) (11/16 0419)  Hemodynamic parameters for last 24 hours:    Intake/Output from previous day: 11/15 0701 - 11/16 0700 In: 1869.3 [P.O.:940; I.V.:929.3] Out: 2350 [Urine:2350] Intake/Output this shift: Total I/O In: -  Out: 300 [Urine:300]       Exam    General- alert and comfortable   Lungs- clear without rales, wheezes   Cor- regular rate and rhythm, no murmur , gallop   Abdomen- soft, non-tender   Extremities - warm, non-tender, minimal edema   Neuro- oriented, appropriate, no focal weakness   Lab Results: Recent Labs    08/11/17 0228 08/12/17 0450  WBC 8.9 8.2  HGB 10.7* 11.0*  HCT 32.6* 32.8*  PLT 217 217   BMET:  Recent Labs    08/11/17 0228  NA 133*  K 4.1  CL 101  CO2 24  GLUCOSE 219*  BUN 19  CREATININE 1.15*  CALCIUM 8.6*    PT/INR:  Recent Labs   08/12/17 0450  LABPROT 18.8*  INR 1.59   ABG    Component Value Date/Time   PHART 7.316 (L) 02/12/2009 0421   HCO3 25.0 (H) 02/12/2009 0421   TCO2 28 04/07/2012 0005   ACIDBASEDEF 2.0 02/12/2009 0421   O2SAT 99.0 02/12/2009 0421   CBG (last 3)  Recent Labs    08/12/17 1142 08/12/17 1628 08/12/17 2111  GLUCAP 236* 134* 175*    Assessment/Plan: S/P Procedure(s) (LRB): LEFT HEART CATH AND CORONARY ANGIOGRAPHY (N/A) Recurrent stenosis of the LAD after multiple PCI.  No other significant CAD.  LV function appears adequate.  Will evaluate pulmonary status since patient  had previous left thoracotomy and wedge resection by Dr. Arlyce Dice for early stage cancer in 2010.  Plavix has been stopped and platelet function test ordered.  Will follow.   LOS: 1 day    Tharon Aquas Trigt III 08/12/2017

## 2017-08-12 NOTE — Progress Notes (Addendum)
[place] for Heparin Indication: ACS, awaiting CVTS workup  Allergies  Allergen Reactions  . Levofloxacin Itching  . Morphine And Related Itching    Patient Measurements: Height: 5\' 3"  (160 cm) Weight: 279 lb 1.6 oz (126.6 kg) IBW/kg (Calculated) : 52.4 Heparin Dosing Weight: 83.2 kg  Vital Signs: Temp: 98.1 F (36.7 C) (11/16 0842) Temp Source: Oral (11/16 0842) BP: 140/45 (11/16 0952) Pulse Rate: 81 (11/16 0952)  Labs: Recent Labs    08/09/17 1341 08/10/17 1141 08/10/17 1629 08/10/17 2216 08/11/17 0228 08/11/17 1322 08/12/17 0450 08/12/17 0920  HGB 12.7  --   --   --  10.7*  --  11.0*  --   HCT 38.3  --   --   --  32.6*  --  32.8*  --   PLT 275  --   --   --  217  --  217  --   LABPROT 25.8*  --  21.3*  --  23.4* 20.8* 18.8*  --   INR 2.38  --  1.86  --  2.10 1.81 1.59  --   HEPARINUNFRC  --   --   --   --  <0.10* 0.16*  --  0.17*  CREATININE 1.10*  --   --   --  1.15*  --   --   --   TROPONINI  --  <0.03 <0.03 <0.03  --   --   --   --     Estimated Creatinine Clearance: 61.5 mL/min (A) (by C-G formula based on SCr of 1.15 mg/dL (H)).  Assessment: 66 year old female s/p cardiac cath with plan for possible CABG- IV heparin restarted earlier this morning. Heparin level low at 0.17 units/mL. CBC is stable.  Per RN, patient with bruise on upper R arm from IV site that patient thinks has gotten bigger- does feel hard, but is warm- MD aware and getting ultrasound. Will continue to follow for changes in size/pain or clinical status  Home warfarin on hold as is home Plavix in anticipation of procedures. INR today 1.59.  Goal of Therapy:  Heparin level 0.3-0.7 units/ml Monitor platelets by anticoagulation protocol: Yes   Plan:  Increase heparin to 1000 units/hr Heparin level in 6 hours Daily heparin level and CBC Follow for surgery plans  Laqueena Hinchey D. Keslee Harrington, PharmD, BCPS Clinical Pharmacist Clinical Phone for 08/12/2017  until 3:30pm: [phone] If after 3:30pm, please call main pharmacy at [phone] 08/12/2017 10:33 AM

## 2017-08-12 NOTE — Progress Notes (Signed)
[place] for Heparin Indication: ACS, awaiting CVTS workup  Allergies  Allergen Reactions  . Levofloxacin Itching  . Morphine And Related Itching    Patient Measurements: Height: 5\' 3"  (160 cm) Weight: 279 lb 1.6 oz (126.6 kg) IBW/kg (Calculated) : 52.4 Heparin Dosing Weight: 83.2 kg  Vital Signs: Temp: 97.6 F (36.4 C) (11/16 1245) Temp Source: Oral (11/16 1245) BP: 133/49 (11/16 1245) Pulse Rate: 69 (11/16 1245)  Labs: Recent Labs    08/10/17 1141  08/10/17 1629 08/10/17 2216  08/11/17 0228 08/11/17 1322 08/12/17 0450 08/12/17 0920 08/12/17 1457  HGB  --   --   --   --   --  10.7*  --  11.0*  --   --   HCT  --   --   --   --   --  32.6*  --  32.8*  --   --   PLT  --   --   --   --   --  217  --  217  --   --   LABPROT  --    < > 21.3*  --   --  23.4* 20.8* 18.8*  --   --   INR  --    < > 1.86  --   --  2.10 1.81 1.59  --   --   HEPARINUNFRC  --   --   --   --    < > <0.10* 0.16*  --  0.17* 0.24*  CREATININE  --   --   --   --   --  1.15*  --   --   --   --   TROPONINI <0.03  --  <0.03 <0.03  --   --   --   --   --   --    < > = values in this interval not displayed.    Estimated Creatinine Clearance: 61.5 mL/min (A) (by C-G formula based on SCr of 1.15 mg/dL (H)).  Assessment: 66 year old female s/p cardiac cath with plan for possible CABG- IV heparin restarted earlier this morning.  Heparin level this PM is 0.24 but this level was drawn early is only a 4 hour level (not 6 hour as ordered).   Per RN, patient with bruise on upper R arm from IV site that patient thinks has gotten bigger- does feel hard, but is warm- MD aware and suspects hematoma but getting ultrasound. Per discussion with RN this PM, no increased bruising and no over bleeding noted. No issues with the infusion.  Home warfarin on hold as is home Plavix in anticipation of procedures. INR today 1.59.  Goal of Therapy:  Heparin level 0.3-0.7  units/ml Monitor platelets by anticoagulation protocol: Yes   Plan:  Increase heparin to 1100 units/hr Repeat Heparin level in 6 hours Daily heparin level and CBC Follow for surgery plans  Sloan Leiter, PharmD, BCPS, BCCCP Clinical Pharmacist Clinical phone 08/12/2017 until 11PM- #[phone] After hours, please call [phone] 08/12/2017 4:58 PM

## 2017-08-12 NOTE — Progress Notes (Signed)
Paged doctor regarding hematoma on right arm.  Pt stated thinks it has gotten bigger.  Marked area for monitoring.   Also called pharmacist- patient is on heparin drip.  Per [name] continue to monitor.   Doctor called back- place order for U/S bilateral upper extremity.

## 2017-08-12 NOTE — Progress Notes (Signed)
Progress Note  Patient Name: [name] Date of Encounter: 08/12/2017  Primary Cardiologist:  Dr. Sallyanne Kuster  Subjective   No chest pain cath sight feels fine   Inpatient Medications    Scheduled Meds: . aspirin EC  81 mg Oral Daily  . atorvastatin  40 mg Oral q1800  . lisinopril  20 mg Oral Daily   And  . hydrochlorothiazide  25 mg Oral Daily  . insulin aspart  0-15 Units Subcutaneous TID WC  . insulin aspart  0-5 Units Subcutaneous QHS  . insulin glargine  10 Units Subcutaneous QHS  . metoprolol tartrate  25 mg Oral BID  . pantoprazole  40 mg Oral Daily  . sodium chloride flush  3 mL Intravenous Q12H   Continuous Infusions: . sodium chloride    . heparin 800 Units/hr (08/12/17 0216)   PRN Meds: sodium chloride, acetaminophen, gi cocktail, ondansetron (ZOFRAN) IV, sodium chloride flush   Vital Signs    Vitals:   08/12/17 0043 08/12/17 0100 08/12/17 0135 08/12/17 0419  BP: (!) 137/50 (!) 137/58 (!) 143/57 (!) 147/70  Pulse: 67 64 68 79  Resp: 18   18  Temp: 98.2 F (36.8 C)   98.4 F (36.9 C)  TempSrc: Oral   Oral  SpO2: 100%   100%  Weight:    279 lb 1.6 oz (126.6 kg)  Height:        Intake/Output Summary (Last 24 hours) at 08/12/2017 0824 Last data filed at 08/12/2017 0600 Gross per 24 hour  Intake 1869.27 ml  Output 2150 ml  Net -280.73 ml   Filed Weights   08/10/17 1635 08/11/17 0531 08/12/17 0419  Weight: 274 lb 8 oz (124.5 kg) 276 lb 1.6 oz (125.2 kg) 279 lb 1.6 oz (126.6 kg)    Telemetry    NSR 08/12/2017  - Personally Reviewed  ECG    N/a today  Physical Exam  BP (!) 147/70 (BP Location: Left Arm)   Pulse 79   Temp 98.4 F (36.9 C) (Oral)   Resp 18   Ht 5\' 3"  (1.6 m)   Wt 279 lb 1.6 oz (126.6 kg)   SpO2 100%   BMI 49.44 kg/m   GEN: Obese black female Neck: No JVD Cardiac: RRR, no murmurs, rubs, or gallops.  Respiratory: Clear to auscultation bilaterally. GI: Soft, nontender, non-distended  MS: No edema; No  deformity. Neuro:  Nonfocal  Psych: Normal affect  Right radial cath sight A no pain or hematoma   Labs    Chemistry Recent Labs  Lab 08/09/17 1341 08/11/17 0228  NA 133* 133*  K 4.4 4.1  CL 99* 101  CO2 27 24  GLUCOSE 256* 219*  BUN 16 19  CREATININE 1.10* 1.15*  CALCIUM 9.6 8.6*  GFRNONAA 51* 48*  GFRAA 59* 56*  ANIONGAP 7 8     Hematology Recent Labs  Lab 08/09/17 1341 08/11/17 0228 08/12/17 0450  WBC 8.4 8.9 8.2  RBC 4.43 3.81* 3.86*  HGB 12.7 10.7* 11.0*  HCT 38.3 32.6* 32.8*  MCV 86.5 85.6 85.0  MCH 28.7 28.1 28.5  MCHC 33.2 32.8 33.5  RDW 13.3 13.2 13.1  PLT 275 217 217    Cardiac Enzymes Recent Labs  Lab 08/10/17 1141 08/10/17 1629 08/10/17 2216  TROPONINI <0.03 <0.03 <0.03    Recent Labs  Lab 08/09/17 1354  TROPIPOC 0.00     BNPNo results for input(s): BNP, PROBNP in the last 168 hours.   DDimer No results for  input(s): DDIMER in the last 168 hours.   Radiology    No results found.  Cardiac Studies   [facility]HC 02/05/2016: 1. Ost - Proximal LAD lesion, 80% and 99% in-stent restenosis. Post intervention with scoring balloon angioplasty followed by a Synergy DES stent covering the entire stented segment., there is a 0% residual stenosis. The lesion was previously treated with a 2 overlapping DES stents in 2013 and 2015. 2. Prox Cx lesion, 55% stenosed. Stable 3. Ramus lesion, 20% stenosed. 4. The left ventricular systolic function is normal Recommendation for lifelong Plavix due to 2 episodes of instent restenosis.  Patient Profile     [name] is a 66 y.o. female with significant PMH of CAD s/p LAD stent with 2 episodes of re-stenosis, HTN, HLD, T2DM, GERD with esophageal stricture presenting [facility] with chest pain similar to prior episodes of stent re-stenosis; she has had negative troponins and an unchanged EKG from priors.  Assessment & Plan    Chest pain: Troponins negative x3. Cath today; Cr. 1.15, INR 2.1, hgb 10.7. Will  repeat INR prior to cath. She should be able to be done radially  Hold plavix this am in case she needs LIMA  CAD s/p prox LAD stent with prior in-stent restenosis: CAth with instent restenosis needs off pump LIMA to LAD  CVTS to see Plavix held Given issues with coumadin as well would be nice To keep in hospital and operate on Monday or Tuesday   PAF: In sinus rhythm currently. On warfarin for anticoagulation; Given CAD and PAF will Keep on heparin   HTN: Continue current regimen.  T2DM: A1c 8.1 this admission. On insulin.  CKD stage 2/3: Stable.  For questions or updates, please contact [place] Please consult [email or web address] for contact info under Cardiology/STEMI.   Signed, Jenkins Rouge, MD  08/12/2017, 8:24 AM

## 2017-08-12 NOTE — Plan of Care (Signed)
  Progressing Pain Managment: General experience of comfort will improve 08/12/2017 0248 - Progressing by Ruben Im, RN Safety: Ability to remain free from injury will improve 08/12/2017 0248 - Progressing by Ruben Im, RN Skin Integrity: Risk for impaired skin integrity will decrease 08/12/2017 0248 - Progressing by Ruben Im, RN

## 2017-08-13 ENCOUNTER — Inpatient Hospital Stay (HOSPITAL_COMMUNITY): Payer: Medicare HMO

## 2017-08-13 LAB — BLOOD GAS, ARTERIAL
Acid-Base Excess: 1.7 mmol/L (ref 0.0–2.0)
Bicarbonate: 25.8 mmol/L (ref 20.0–28.0)
Drawn by: 518311
FIO2: 21
O2 Saturation: 92.1 %
Patient temperature: 98.6
pCO2 arterial: 41 mmHg (ref 32.0–48.0)
pH, Arterial: 7.415 (ref 7.350–7.450)
pO2, Arterial: 59.8 mmHg — ABNORMAL LOW (ref 83.0–108.0)

## 2017-08-13 LAB — CBC
HCT: 32.6 % — ABNORMAL LOW (ref 36.0–46.0)
HEMOGLOBIN: 10.8 g/dL — AB (ref 12.0–15.0)
MCH: 28.2 pg (ref 26.0–34.0)
MCHC: 33.1 g/dL (ref 30.0–36.0)
MCV: 85.1 fL (ref 78.0–100.0)
Platelets: 216 10*3/uL (ref 150–400)
RBC: 3.83 MIL/uL — ABNORMAL LOW (ref 3.87–5.11)
RDW: 13.1 % (ref 11.5–15.5)
WBC: 8.2 10*3/uL (ref 4.0–10.5)

## 2017-08-13 LAB — COMPREHENSIVE METABOLIC PANEL
ALT: 15 U/L (ref 14–54)
AST: 17 U/L (ref 15–41)
Albumin: 2.9 g/dL — ABNORMAL LOW (ref 3.5–5.0)
Alkaline Phosphatase: 74 U/L (ref 38–126)
Anion gap: 7 (ref 5–15)
BUN: 12 mg/dL (ref 6–20)
CO2: 25 mmol/L (ref 22–32)
Calcium: 9 mg/dL (ref 8.9–10.3)
Chloride: 104 mmol/L (ref 101–111)
Creatinine, Ser: 0.98 mg/dL (ref 0.44–1.00)
GFR calc Af Amer: >60 mL/min (ref >60)
GFR calc non Af Amer: 58 mL/min — ABNORMAL LOW (ref >60)
Glucose, Bld: 202 mg/dL — ABNORMAL HIGH (ref 65–99)
Potassium: 3.7 mmol/L (ref 3.5–5.1)
Sodium: 136 mmol/L (ref 135–145)
Total Bilirubin: 0.5 mg/dL (ref 0.3–1.2)
Total Protein: 6.6 g/dL (ref 6.5–8.1)

## 2017-08-13 LAB — URINALYSIS, ROUTINE W REFLEX MICROSCOPIC
Bilirubin Urine: NEGATIVE
Glucose, UA: 150 mg/dL — AB
Ketones, ur: NEGATIVE mg/dL
Leukocytes, UA: NEGATIVE
Nitrite: NEGATIVE
Protein, ur: NEGATIVE mg/dL
Specific Gravity, Urine: 1.013 (ref 1.005–1.030)
Squamous Epithelial / LPF: NONE SEEN
pH: 6 (ref 5.0–8.0)

## 2017-08-13 LAB — HEPARIN LEVEL (UNFRACTIONATED)
HEPARIN UNFRACTIONATED: 0.5 [IU]/mL (ref 0.30–0.70)
HEPARIN UNFRACTIONATED: 0.49 [IU]/mL (ref 0.30–0.70)

## 2017-08-13 LAB — SURGICAL PCR SCREEN
MRSA, PCR: NEGATIVE
Staphylococcus aureus: NEGATIVE

## 2017-08-13 LAB — GLUCOSE, CAPILLARY
GLUCOSE-CAPILLARY: 184 mg/dL — AB (ref 65–99)
GLUCOSE-CAPILLARY: 75 mg/dL (ref 65–99)
Glucose-Capillary: 169 mg/dL — ABNORMAL HIGH (ref 65–99)
Glucose-Capillary: 277 mg/dL — ABNORMAL HIGH (ref 65–99)
Glucose-Capillary: 101 mg/dL — ABNORMAL HIGH (ref 65–99)

## 2017-08-13 LAB — PROTIME-INR
INR: 1.4
Prothrombin Time: 17 seconds — ABNORMAL HIGH (ref 11.4–15.2)

## 2017-08-13 LAB — PLATELET INHIBITION P2Y12: Platelet Function  P2Y12: 216 [PRU] (ref 194–418)

## 2017-08-13 LAB — BRAIN NATRIURETIC PEPTIDE: B NATRIURETIC PEPTIDE 5: 81.8 pg/mL (ref 0.0–100.0)

## 2017-08-13 LAB — HEMOGLOBIN A1C
Hgb A1c MFr Bld: 7.9 % — ABNORMAL HIGH (ref 4.8–5.6)
Mean Plasma Glucose: 180.03 mg/dL

## 2017-08-13 LAB — TSH: TSH: 1.744 u[IU]/mL (ref 0.350–4.500)

## 2017-08-13 MED ORDER — ALUM & MAG HYDROXIDE-SIMETH 200-200-20 MG/5ML PO SUSP
15.0000 mL | Freq: Four times a day (QID) | ORAL | Status: DC | PRN
  Administered 2017-08-13: 15 mL via ORAL
  Filled 2017-08-13: qty 30

## 2017-08-13 NOTE — Progress Notes (Signed)
[place] for Heparin Indication: ACS, awaiting CABG  Allergies  Allergen Reactions  . Levofloxacin Itching  . Morphine And Related Itching   Patient Measurements: Height: 5\' 3"  (160 cm) Weight: 276 lb 12.8 oz (125.6 kg) IBW/kg (Calculated) : 52.4 Heparin Dosing Weight: 83.2 kg  Vital Signs: Temp: 98.2 F (36.8 C) (11/17 1154) Temp Source: Oral (11/17 1154) BP: 157/73 (11/17 1154) Pulse Rate: 84 (11/17 1154)  Labs: Recent Labs    08/10/17 1629 08/10/17 2216  08/11/17 0228 08/11/17 1322 08/12/17 0450  08/12/17 1457 08/13/17 0331 08/13/17 1306  HGB  --   --    < > 10.7*  --  11.0*  --   --  10.8*  --   HCT  --   --   --  32.6*  --  32.8*  --   --  32.6*  --   PLT  --   --   --  217  --  217  --   --  216  --   LABPROT 21.3*  --   --  23.4* 20.8* 18.8*  --   --  17.0*  --   INR 1.86  --   --  2.10 1.81 1.59  --   --  1.40  --   HEPARINUNFRC  --   --    < > <0.10* 0.16*  --    < > 0.24* 0.50 0.49  CREATININE  --   --   --  1.15*  --   --   --   --  0.98  --   TROPONINI <0.03 <0.03  --   --   --   --   --   --   --   --    < > = values in this interval not displayed.    Estimated Creatinine Clearance: 71.8 mL/min (by C-G formula based on SCr of 0.98 mg/dL).  Assessment: 66 year old female s/p cardiac cath with plan for possible CABG- IV heparin resumed per pharmacy.  Heparin level remains therapeutic. CBC stable. No bleeding documented.  PTA warfarin and Plavix on hold. INR this AM is 1.4.   Goal of Therapy:  Heparin level 0.3-0.7 units/ml Monitor platelets by anticoagulation protocol: Yes   Plan:  Continue heparin gtt at 1100 units/hr Daily heparin level and CBC Monitor for s/s bleeding Possibly CABG early next week Warfarin and Plavix on hold   Elicia Lamp, PharmD, BCPS Clinical Pharmacist Rx Phone # for today: [phone] After 3:30PM, please call Main Rx: [phone] 08/13/2017 1:45 PM

## 2017-08-13 NOTE — Progress Notes (Signed)
[id number] Helped pt pivot to [facility]. Stated she has bad knees. Pt barely able to move legs to get to [facility]. Needs PT consult. Pt stated she uses wheelchair when shopping and only walks in house. Discussed sternal precautions. It is going to be hard for pt to adhere to sternal precautions with her bad knees. Need PT to help evaluate so we can get baseline prior to surgery. Gave IS and she correctly demonstrated 1000 ml. Gave [facility] booklet, care guide and wrote down how to view pre op video. Discussed with pt that she will need someone with her 24/7 first week home. Graylon Good RN BSN 08/13/2017 3:21 PM

## 2017-08-13 NOTE — Progress Notes (Signed)
Pt is stable in AM shift, vitals stable, denies CP and SOB, heparin continue @11cc /hr, surgical PCR and UA sent pending from yesterday, plan surgery early next week, pt is aware about treatment plan, will continue to monitor

## 2017-08-13 NOTE — Progress Notes (Signed)
PROGRESS NOTE  [name] DTO:[id number] DOB: [birth year] DOA: 08/09/2017 PCP: [name], MD  HPI/Recap of past 24 hours: Brandi A Farringtonis a 66 y.o.femalewith significant PMH of CAD s/p LAD stent with 2 episodes of re-stenosis, HTN, HLD, T2DM, GERD with esophageal stricture presents with chest pain similar to prior episodes of stent re-stenosis. Negative troponins and unchanged EKG from priors. Cardiology consulted, s/p left cath with coronary angiography 08/11/17. Cardiology recommends CTS evaluation for possible CABG. Holding off coumadin and plavix due to possible surgical intervention on Monday or Tuesday.  This morning patient had no complaints. Denies chest pain, dyspnea or palpitations. Currently on heparin drip and will continue. Possible CABG planned early next week.    Assessment/Plan: Principal Problem:   Chest pain Active Problems:   Essential hypertension   Paroxysmal atrial fibrillation   Unstable angina (HCC)   CAD s/p LAD DES 2013, 2015 and 2017   Dyslipidemia   Chronic anticoagulation   DM (diabetes mellitus), type 2 with peripheral vascular complications (HCC)   CKD (chronic kidney disease) stage 3, GFR 30-59 ml/min (HCC)   Chest pain 2/2 ACS -ACS ruled in -cardiology following -s/p left cath and coronary angiography 08/11/17 -Single vessel obstructive CAD with severe in stent restenosis in the proximal LAD -Normal LV function -Normal LVEDP -patient has recurrent restenosis in a large proximal LAD which has been stented multiple times in the past. -Cardiology recommends CTS to evaluate for CABG.  -Will hold Plavix and coumadin.  -Resume IV heparin. Pharmacy team management appreciated  CAD s/p prox LAD stent with recurrent in-stent restenosis: -hold plavix 75mg  daily and coumadin due to possible surgical intervention CABG early next week -continue lisinopril 20mg  daily, metoprolol 25mg  BID  Paroxismal A-fib -sinus rhythm  currently -previously on warfarin for anticoagulation; changed to heparin due to possible surgical intervention on Monday, Tuesday, or wednesday  Subtherapeutic INR -INR 1.59 -continue heparin drip for possible CABG early next week  Hyponatremia -resolved Na+ 136 -BMP am on Monday 08/15/17  HTN: -Stable -Continue lisinopril, HCTZ, metoprolol  T2DM: -A1c 7.9 (08/13/17) - insulin SS with hypoglycemia protocol  CKD stage 2 -Stable. -cr 0.98   Code Status: Full  Family Communication: No family members at bedside. Has a son who works in the [place] at [facility].  Disposition Plan: Will stay another midnight to continue close monitoring and heparin infusion with anticipation of possible CABG on Monday.   Consultants:  Cardiology  Procedures:  Left cardiac cath through right radial access  Antimicrobials:  none  DVT prophylaxis:  heparin drip   Objective: Vitals:   08/12/17 1700 08/12/17 2035 08/13/17 0611 08/13/17 1154  BP: (!) 140/48 (!) 138/47 (!) 137/49 (!) 157/73  Pulse: 68 73 71 84  Resp: 18 18 18 18   Temp: 97.7 F (36.5 C) 98.3 F (36.8 C) 98.1 F (36.7 C) 98.2 F (36.8 C)  TempSrc: Oral Oral Oral Oral  SpO2: 100% 99% 98% 100%  Weight:   125.6 kg (276 lb 12.8 oz)   Height:        Intake/Output Summary (Last 24 hours) at 08/13/2017 1337 Last data filed at 08/13/2017 0910 Gross per 24 hour  Intake 429.4 ml  Output 550 ml  Net -120.6 ml   Filed Weights   08/11/17 0531 08/12/17 0419 08/13/17 0611  Weight: 125.2 kg (276 lb 1.6 oz) 126.6 kg (279 lb 1.6 oz) 125.6 kg (276 lb 12.8 oz)    Exam:   General:  66 yo AAF WD  WN in NAD   Cardiovascular: RRR with no rubs or gallops   Respiratory: CTA with no wheezes or rales  Abdomen: soft obese NT ND with NBS x 4 quadrants  Musculoskeletal: moves all limbs. Right upper arm hematoma from previous venous puncture site. Right radial access appears clean with no drainage noted  Skin: bruising  noted from venous puncture sites and hematoma as stated above  Psychiatry: Mood is anxious   Data Reviewed: CBC: Recent Labs  Lab 08/09/17 1341 08/11/17 0228 08/12/17 0450 08/13/17 0331  WBC 8.4 8.9 8.2 8.2  HGB 12.7 10.7* 11.0* 10.8*  HCT 38.3 32.6* 32.8* 32.6*  MCV 86.5 85.6 85.0 85.1  PLT 275 217 217 767   Basic Metabolic Panel: Recent Labs  Lab 08/09/17 1341 08/11/17 0228 08/13/17 0331  NA 133* 133* 136  K 4.4 4.1 3.7  CL 99* 101 104  CO2 27 24 25   GLUCOSE 256* 219* 202*  BUN 16 19 12   CREATININE 1.10* 1.15* 0.98  CALCIUM 9.6 8.6* 9.0   GFR: Estimated Creatinine Clearance: 71.8 mL/min (by C-G formula based on SCr of 0.98 mg/dL). Liver Function Tests: Recent Labs  Lab 08/13/17 0331  AST 17  ALT 15  ALKPHOS 74  BILITOT 0.5  PROT 6.6  ALBUMIN 2.9*   No results for input(s): LIPASE, AMYLASE in the last 168 hours. No results for input(s): AMMONIA in the last 168 hours. Coagulation Profile: Recent Labs  Lab 08/10/17 1629 08/11/17 0228 08/11/17 1322 08/12/17 0450 08/13/17 0331  INR 1.86 2.10 1.81 1.59 1.40   Cardiac Enzymes: Recent Labs  Lab 08/10/17 1141 08/10/17 1629 08/10/17 2216  TROPONINI <0.03 <0.03 <0.03   BNP (last 3 results) No results for input(s): PROBNP in the last 8760 hours. HbA1C: Recent Labs    08/13/17 0331  HGBA1C 7.9*   CBG: Recent Labs  Lab 08/12/17 1142 08/12/17 1628 08/12/17 2111 08/13/17 0722 08/13/17 1124  GLUCAP 236* 134* 175* 169* 184*   Lipid Profile: No results for input(s): CHOL, HDL, LDLCALC, TRIG, CHOLHDL, LDLDIRECT in the last 72 hours. Thyroid Function Tests: Recent Labs    08/13/17 0331  TSH 1.744   Anemia Panel: No results for input(s): VITAMINB12, FOLATE, FERRITIN, TIBC, IRON, RETICCTPCT in the last 72 hours. Urine analysis:    Component Value Date/Time   COLORURINE YELLOW 12/21/2013 1129   APPEARANCEUR CLEAR 12/21/2013 1129   LABSPEC 1.014 12/21/2013 1129   PHURINE 6.5 12/21/2013  1129   GLUCOSEU NEGATIVE 12/21/2013 1129   HGBUR NEGATIVE 12/21/2013 1129   BILIRUBINUR NEGATIVE 12/21/2013 1129   KETONESUR NEGATIVE 12/21/2013 1129   PROTEINUR NEGATIVE 12/21/2013 1129   UROBILINOGEN 0.2 12/21/2013 1129   NITRITE NEGATIVE 12/21/2013 1129   LEUKOCYTESUR NEGATIVE 12/21/2013 1129   Sepsis Labs: @LABRCNTIP (procalcitonin:4,lacticidven:4)  )No results found for this or any previous visit (from the past 240 hour(s)).    Studies: Dg Chest 2 View  Result Date: 08/13/2017 CLINICAL DATA:  Chest pain EXAM: CHEST  2 VIEW COMPARISON:  08/09/2017 FINDINGS: Cardiomegaly with vascular congestion mild interstitial prominence could reflect mild interstitial edema. No visible effusions or acute bony abnormality. Surgical changes in the left hilum. Left cardiac stent noted. IMPRESSION: Cardiomegaly with vascular congestion and interstitial prominence, possibly interstitial edema. Electronically Signed   By: Rolm Baptise M.D.   On: 08/13/2017 11:01   [place] Rt Upper Extrem Ltd Soft Tissue Non Vascular  Result Date: 08/13/2017 CLINICAL DATA:  Hematoma. EXAM: ULTRASOUND RIGHT UPPER EXTREMITY LIMITED TECHNIQUE: Ultrasound examination of the upper extremity  soft tissues was performed in the area of clinical concern. COMPARISON:  None. FINDINGS: Edema within the right upper arm surrounding site of prior venous puncture. No discrete hematoma. No drainable fluid collection. IMPRESSION: Edema within the right upper arm without discrete hematoma. Electronically Signed   By: Titus Dubin M.D.   On: 08/13/2017 09:37    Scheduled Meds: . aspirin EC  81 mg Oral Daily  . atorvastatin  40 mg Oral q1800  . lisinopril  20 mg Oral Daily   And  . hydrochlorothiazide  25 mg Oral Daily  . insulin aspart  0-15 Units Subcutaneous TID WC  . insulin aspart  0-5 Units Subcutaneous QHS  . insulin glargine  10 Units Subcutaneous QHS  . metoprolol tartrate  25 mg Oral BID  . pantoprazole  40 mg Oral Daily  .  sodium chloride flush  3 mL Intravenous Q12H    Continuous Infusions: . sodium chloride    . heparin 1,100 Units/hr (08/13/17 1218)     LOS: 2 days     Kayleen Memos, MD [facility] Pager [phone]  If 7PM-7AM, please contact night-coverage [email or web address] Password [email or web address] 08/13/2017, 1:37 PM

## 2017-08-13 NOTE — Progress Notes (Signed)
2 Days Post-Op Procedure(s) (LRB): LEFT HEART CATH AND CORONARY ANGIOGRAPHY (N/A) Subjective:  P2 Y12 assay shows plt fx depressed - plavix on hold. Repeat p2y12  in 48 hrs Earliest opening in OR schedule next wed She needs LIMA to LAD- no vein avail for conduit Since she has had L thoracotomy in past wil need CARDIAC CTA to document patency of LIMA prior to surgery  Objective: Vital signs in last 24 hours: Temp:  [97.7 F (36.5 C)-98.3 F (36.8 C)] 98.2 F (36.8 C) (11/17 1154) Pulse Rate:  [68-84] 84 (11/17 1154) Cardiac Rhythm: Normal sinus rhythm (11/17 0759) Resp:  [18] 18 (11/17 1154) BP: (137-157)/(47-73) 157/73 (11/17 1154) SpO2:  [98 %-100 %] 100 % (11/17 1154) Weight:  [276 lb 12.8 oz (125.6 kg)] 276 lb 12.8 oz (125.6 kg) (11/17 8413)  Hemodynamic parameters for last 24 hours:    Intake/Output from previous day: 11/16 0701 - 11/17 0700 In: 909.4 [P.O.:840; I.V.:69.4] Out: 1050 [Urine:1050] Intake/Output this shift: Total I/O In: 240 [P.O.:240] Out: -   Resting comfortably  Lab Results: Recent Labs    08/12/17 0450 08/13/17 0331  WBC 8.2 8.2  HGB 11.0* 10.8*  HCT 32.8* 32.6*  PLT 217 216   BMET:  Recent Labs    08/11/17 0228 08/13/17 0331  NA 133* 136  K 4.1 3.7  CL 101 104  CO2 24 25  GLUCOSE 219* 202*  BUN 19 12  CREATININE 1.15* 0.98  CALCIUM 8.6* 9.0    PT/INR:  Recent Labs    08/13/17 0331  LABPROT 17.0*  INR 1.40   ABG    Component Value Date/Time   PHART 7.415 08/13/2017 0514   HCO3 25.8 08/13/2017 0514   TCO2 28 04/07/2012 0005   ACIDBASEDEF 2.0 02/12/2009 0421   O2SAT 92.1 08/13/2017 0514   CBG (last 3)  Recent Labs    08/12/17 2111 08/13/17 0722 08/13/17 1124  GLUCAP 175* 169* 184*    Assessment/Plan: S/P Procedure(s) (LRB): LEFT HEART CATH AND CORONARY ANGIOGRAPHY (N/A) Need cardiac CT next week Will recheck plt function assay monday   LOS: 2 days    Tharon Aquas Trigt III 08/13/2017

## 2017-08-13 NOTE — Progress Notes (Signed)
[place] for Heparin Indication: ACS, awaiting CVTS workup  Allergies  Allergen Reactions  . Levofloxacin Itching  . Morphine And Related Itching   Patient Measurements: Height: 5\' 3"  (160 cm) Weight: 279 lb 1.6 oz (126.6 kg) IBW/kg (Calculated) : 52.4 Heparin Dosing Weight: 83.2 kg  Vital Signs: Temp: 98.3 F (36.8 C) (11/16 2035) Temp Source: Oral (11/16 2035) BP: 138/47 (11/16 2035) Pulse Rate: 73 (11/16 2035)  Labs: Recent Labs    08/10/17 1141  08/10/17 1629 08/10/17 2216  08/11/17 0228 08/11/17 1322 08/12/17 0450 08/12/17 0920 08/12/17 1457 08/13/17 0331  HGB  --   --   --   --    < > 10.7*  --  11.0*  --   --  10.8*  HCT  --   --   --   --   --  32.6*  --  32.8*  --   --  32.6*  PLT  --   --   --   --   --  217  --  217  --   --  216  LABPROT  --    < > 21.3*  --   --  23.4* 20.8* 18.8*  --   --  17.0*  INR  --    < > 1.86  --   --  2.10 1.81 1.59  --   --  1.40  HEPARINUNFRC  --   --   --   --    < > <0.10* 0.16*  --  0.17* 0.24* 0.50  CREATININE  --   --   --   --   --  1.15*  --   --   --   --   --   TROPONINI <0.03  --  <0.03 <0.03  --   --   --   --   --   --   --    < > = values in this interval not displayed.    Estimated Creatinine Clearance: 61.5 mL/min (A) (by C-G formula based on SCr of 1.15 mg/dL (H)).  Assessment: 66 year old female s/p cardiac cath with plan for possible CABG- IV heparin resumed per pharmacy.   Heparin level is therapeutic at 0.5. No new overt s/s bleeding per RN.   Noted heparin infusion rate was never charted as increased from 1000 units/hr > 1100 units/hr (rate change due at 1700 on 11/16). Spoke with nightshift RN who states heparin gtt has been running at 1100 units/hr overnight. RN to update MAR.   PTA warfarin and Plavix on hold. INR this AM is 1.4.   Goal of Therapy:  Heparin level 0.3-0.7 units/ml Monitor platelets by anticoagulation protocol: Yes   Plan:  Continue  heparin gtt at 1100 units/hr Confirm heparin level in 6 hrs Daily heparin level and CBC Monitor for s/s bleeding   Lavonda Jumbo, PharmD Clinical Pharmacist 08/13/17 4:30 AM

## 2017-08-14 ENCOUNTER — Inpatient Hospital Stay (HOSPITAL_COMMUNITY): Payer: Medicare HMO

## 2017-08-14 LAB — HEPARIN LEVEL (UNFRACTIONATED): Heparin Unfractionated: 0.36 IU/mL (ref 0.30–0.70)

## 2017-08-14 LAB — GLUCOSE, CAPILLARY
GLUCOSE-CAPILLARY: 152 mg/dL — AB (ref 65–99)
GLUCOSE-CAPILLARY: 220 mg/dL — AB (ref 65–99)
GLUCOSE-CAPILLARY: 210 mg/dL — AB (ref 65–99)
Glucose-Capillary: 219 mg/dL — ABNORMAL HIGH (ref 65–99)
Glucose-Capillary: 132 mg/dL — ABNORMAL HIGH (ref 65–99)

## 2017-08-14 LAB — CBC
HEMATOCRIT: 30.7 % — AB (ref 36.0–46.0)
Hemoglobin: 10.1 g/dL — ABNORMAL LOW (ref 12.0–15.0)
MCH: 28 pg (ref 26.0–34.0)
MCHC: 32.9 g/dL (ref 30.0–36.0)
MCV: 85 fL (ref 78.0–100.0)
PLATELETS: 207 10*3/uL (ref 150–400)
RBC: 3.61 MIL/uL — ABNORMAL LOW (ref 3.87–5.11)
RDW: 13 % (ref 11.5–15.5)
WBC: 8.7 10*3/uL (ref 4.0–10.5)

## 2017-08-14 LAB — PROTIME-INR
INR: 1.22
Prothrombin Time: 15.3 seconds — ABNORMAL HIGH (ref 11.4–15.2)

## 2017-08-14 NOTE — Progress Notes (Signed)
[place] for Heparin Indication: ACS, awaiting CABG  Allergies  Allergen Reactions  . Levofloxacin Itching  . Morphine And Related Itching   Patient Measurements: Height: 5\' 3"  (160 cm) Weight: 276 lb 6.4 oz (125.4 kg) IBW/kg (Calculated) : 52.4 Heparin Dosing Weight: 83.2 kg  Vital Signs: Temp: 98.1 F (36.7 C) (11/18 0547) Temp Source: Oral (11/18 0547) Pulse Rate: 63 (11/18 0547)  Labs: Recent Labs    08/12/17 0450  08/13/17 0331 08/13/17 1306 08/14/17 0519  HGB 11.0*  --  10.8*  --  10.1*  HCT 32.8*  --  32.6*  --  30.7*  PLT 217  --  216  --  207  LABPROT 18.8*  --  17.0*  --  15.3*  INR 1.59  --  1.40  --  1.22  HEPARINUNFRC  --    < > 0.50 0.49 0.36  CREATININE  --   --  0.98  --   --    < > = values in this interval not displayed.    Estimated Creatinine Clearance: 71.8 mL/min (by C-G formula based on SCr of 0.98 mg/dL).  Assessment: 67 year old female s/p cardiac cath with plan for possible CABG- IV heparin resumed per pharmacy.  Heparin level remains therapeutic. CBC stable. No bleeding documented.  PTA warfarin and Plavix on hold. INR this AM is 1.22.   Goal of Therapy:  Heparin level 0.3-0.7 units/ml Monitor platelets by anticoagulation protocol: Yes   Plan:  Continue heparin gtt at 1100 units/hr Daily heparin level and CBC Monitor for s/s bleeding Possible CABG next week Warfarin and Plavix on hold   Elicia Lamp, PharmD, BCPS Clinical Pharmacist Rx Phone # for today: [phone] After 3:30PM, please call Main Rx: #[id number] 08/14/2017 10:40 AM

## 2017-08-14 NOTE — H&P (View-Only) (Signed)
[address]       [place] [address]             [phone]        [name] [facility] Medical Record #[id number] Date of Birth: 1951-08-28  Referring:Brandi Zarazua [place] MD Primary Care: Georgianne Fick, MD  Chief Complaint:    Chief Complaint  Patient presents with  . Chest Pain  Patient examined, most recent coronary angiogram images personally reviewed and counseled with patient  History of Present Illness:     [name] is a 66 y.o. female with medical history significant of IDDM; OSA not on CPAP; CAD; PVD; afib on Coumadin; remote lung cancer; HTN; and HLD presenting with chest pain starting at about 12pm today.  She was sitting down when it started.  Substernal in nature.  The pain lasted about 2 minutes.  It resolved spontaneously.  After that she felt like she indigestion.  No SOB, diaphoresis.  She has had similar symptoms before when she had restenosed artery.  Last saw cardiology in May of this year.   Last cath prior to this admission was 2017 -Ost - Proximal LAD lesion, 80% and 99% in-stent restenosis. Post intervention with scoring balloon angioplasty followed by a Synergy DES stent covering the entire stented segment., there is a 0% residual stenosis. The lesion was previously treated with a 2 overlapping DES stents in 2013 and 2015. -Prox Cx lesion, 50% stenosed. Stable -Ramus lesion, 20% stenosed. -The left ventricular systolic function was normal  The patient has been on long-term Plavix.  She has had LAD procedures in 2013, 2015, 2017 and now returns with 90% in-stent stenosis.  With injected nitroglycerin the distal LAD shows adequate size and improved filling.  In 2010 the patient had left thoracotomy and wedge resection of lingular and left lower lobe small adenocarcinoma stage I which further therapy was not performed.  Lymph nodes were negative.  Last CT scan of chest was 2017 without evidence of recurrence, no abnormal  mediastinal adenopathy, with post surgical changes at the left lung base  Patient has had left total knee replacement with several revisions and uses a rolling walker to get around her home.  Echocardiogram is pending but LV function is normal by ventriculogram and LVEDP is 14  PFTs have been performed showing adequate mechanics and diffusion capacity for sternotomy.  Room air PO2 60, PCO2 41, pH 7.4  Initial P2 Y 12 assay is 216, hemoglobin A1c 7.9, creatinine 0.9, hemoglobin 10.8  Current Activity/ Functional Status: Functional status is limited by her obesity and left knee arthritis   Zubrod Score: At the time of surgery this patient's most appropriate activity status/level should be described as: []     0    Normal activity, no symptoms []     1    Restricted in physical strenuous activity but ambulatory, able to do out light work [x]     2    Ambulatory and capable of self care, unable to do work activities, up and about                 more than 50%  Of the time                            []     3    Only limited self care, in bed greater than 50% of waking hours []     4  Completely disabled, no self care, confined to bed or chair []     5    Moribund  Past Medical History:  Diagnosis Date  . Arthritis    "knees" (02/05/2016)  . Cancer (HCC) 5/10   LUL Vats   . Coronary artery disease   . GERD (gastroesophageal reflux disease)    tx meds  . H/O hiatal hernia   . Heart palpitations    monitor 04/19/12-05/10/12- frequent PAF with RVR- coumadin started  . History of blood transfusion "several"   last april 2015 s/p knee surgery (02/05/2016)  . Hypercholesteremia   . Hypertension   . Lung cancer (HCC)    2010, surgery 18% left lung no radiation or chemo  . Non-STEMI (non-ST elevated myocardial infarction) (HCC) 04/07/2012   See cath results below  . Paroxysmal atrial fibrillation (HCC) 04/07/2012   On Warfarin  . Peripheral vascular disease (HCC) 8/12   Lt SFA PTA  . Presence  of stent in LAD coronary artery 04/07/2012   Xience Expedition DES 2.75 mm x 18 mm (dilated to 3.0 mm)  . Sleep apnea    does not wear CPAP  . Type II diabetes mellitus (HCC)    insulin dependent    Past Surgical History:  Procedure Laterality Date  . ABDOMINAL HYSTERECTOMY  1990  . BREAST EXCISIONAL BIOPSY Right   . CARDIAC CATHETERIZATION  11/04/2008   patent RCA, LM, and Circ, nl EF  . CARDIAC CATHETERIZATION  02/20/2002   patent coronaries with the only abnormality being a smooth luminla irregularity in the mid intermediate ramus branch no felt to be hemodynamically significant, nl LV  . CATARACT EXTRACTION W/ INTRAOCULAR LENS  IMPLANT, BILATERAL  2013  . CORONARY ANGIOPLASTY WITH STENT PLACEMENT  04/07/2012   Xience Expedition DES 2.85mm x 18 mm (dilated to 3.0 mm) to the prox LAD   . CORONARY ANGIOPLASTY WITH STENT PLACEMENT  2013; 2015  . Coronary Stent Intervention N/A 02/05/2016   Performed by Marykay Lex, MD at [facility] INVASIVE CV LAB  . DILATION AND CURETTAGE OF UTERUS  <1990  . ESOPHAGOGASTRODUODENOSCOPY (EGD) N/A 05/23/2015   Performed by Jeani Hawking, MD at [facility] ENDOSCOPY  . ESOPHAGOGASTRODUODENOSCOPY (EGD) WITH PROPOFOL N/A 06/13/2015   Performed by Jeani Hawking, MD at [facility] ENDOSCOPY  . FRACTURE SURGERY    . HAMMER TOE SURGERY Bilateral 1993   with screws, on screw removed  . JOINT REPLACEMENT    . KNEE ARTHROSCOPY Bilateral    left x2, right x1  . LAPAROSCOPIC CHOLECYSTECTOMY    . LEFT HEART CATH AND CORONARY ANGIOGRAPHY N/A 08/11/2017   Performed by [name], Chais Fehringer M, MD at [facility] INVASIVE CV LAB  . Left Heart Cath and Coronary Angiography N/A 02/05/2016   Performed by Marykay Lex, MD at [facility] INVASIVE CV LAB  . LEFT HEART CATHETERIZATION WITH CORONARY ANGIOGRAM N/A 05/27/2014   Performed by Runell Gess, MD at [facility] CATH LAB  . LEFT HEART CATHETERIZATION WITH CORONARY ANGIOGRAM N/A 04/07/2012   Performed by Runell Gess, MD at [facility] CATH LAB  . LEFT TOTAL KNEE RESECTION  Left 11/05/2013   Performed by Shelda Pal, MD at [facility] ORS  . LOWER EXTREMITY ANGIOGRAM  05/17/11   directional atherectomy to the prox L SFA using a LX Man TurboHawk, ballooned with a [employer] balloon   . OPEN REDUCTION INTERNAL FIXATION (ORIF) ANKLE FRACTURE Left 07/09/2012   Performed by Cammy Copa, MD at [facility] ORS  . PERCUTANEOUS CORONARY  STENT INTERVENTION (PCI-S)  05/27/2014   Performed by Runell Gess, MD at [facility] CATH LAB  . PERCUTANEOUS CORONARY STENT INTERVENTION (PCI-S) N/A 04/07/2012   Performed by Runell Gess, MD at [facility] CATH LAB  . RE-INPLANTATION LEFT TOTAL KNEE Left 12/31/2013   Performed by Shelda Pal, MD at [facility] ORS  . SAVORY DILATION N/A 06/13/2015   Performed by Jeani Hawking, MD at [facility] ENDOSCOPY  . TONSILLECTOMY    . TOTAL KNEE ARTHROPLASTY Left 2003  . TOTAL KNEE REVISION Left 2007   opened in 2006 and cleaned out, 2007 revision  . VIDEO ASSISTED THORACOSCOPY (VATS)/ LOBECTOMY  01/2009    Social History   Tobacco Use  Smoking Status Former Smoker  . Packs/day: 1.00  . Years: 20.00  . Pack years: 20.00  . Types: Cigarettes  . Last attempt to quit: 09/27/1984  . Years since quitting: 32.9  Smokeless Tobacco Never Used    Social History   Substance and Sexual Activity  Alcohol Use No    Social History   Socioeconomic History  . Marital status: Single    Spouse name: Not on file  . Number of children: Not on file  . Years of education: Not on file  . Highest education level: Not on file  Social Needs  . Financial resource strain: Not on file  . Food insecurity - worry: Not on file  . Food insecurity - inability: Not on file  . Transportation needs - medical: Not on file  . Transportation needs - non-medical: Not on file  Occupational History  . Occupation: Retired  Tobacco Use  . Smoking status: Former Smoker    Packs/day: 1.00    Years: 20.00    Pack years: 20.00    Types: Cigarettes    Last attempt to quit: 09/27/1984    Years  since quitting: 32.9  . Smokeless tobacco: Never Used  Substance and Sexual Activity  . Alcohol use: No  . Drug use: No  . Sexual activity: No    Birth control/protection: Post-menopausal  Other Topics Concern  . Not on file  Social History Narrative   Patient lives alone.    Allergies  Allergen Reactions  . Levofloxacin Itching  . Morphine And Related Itching    Current Facility-Administered Medications  Medication Dose Route Frequency Provider Last Rate Last Dose  . 0.9 %  sodium chloride infusion  250 mL Intravenous PRN [name], Thereasa Iannello M, MD      . acetaminophen (TYLENOL) tablet 650 mg  650 mg Oral Q4H PRN Jonah Blue, MD      . alum & mag hydroxide-simeth (MAALOX/MYLANTA) 200-200-20 MG/5ML suspension 15 mL  15 mL Oral Q6H PRN Dow Adolph N, DO   15 mL at 08/13/17 2342  . aspirin EC tablet 81 mg  81 mg Oral Daily Jonah Blue, MD   81 mg at 08/14/17 2952  . atorvastatin (LIPITOR) tablet 40 mg  40 mg Oral q1800 Jonah Blue, MD   40 mg at 08/13/17 1715  . gi cocktail (Maalox,Lidocaine,Donnatal)  30 mL Oral QID PRN Jonah Blue, MD      . heparin ADULT infusion 100 units/mL (25000 units/211mL sodium chloride 0.45%)  1,100 Units/hr Intravenous Continuous Quenton Fetter, RPH 11 mL/hr at 08/14/17 0929 1,100 Units/hr at 08/14/17 0929  . lisinopril (PRINIVIL,ZESTRIL) tablet 20 mg  20 mg Oral Daily Jonah Blue, MD   20 mg at 08/14/17 8413   And  . hydrochlorothiazide (HYDRODIURIL) tablet 25 mg  25 mg Oral Daily Jonah Blue, MD   25 mg at 08/14/17 7829  . insulin aspart (novoLOG) injection 0-15 Units  0-15 Units Subcutaneous TID WC Jonah Blue, MD   5 Units at 08/14/17 1151  . insulin aspart (novoLOG) injection 0-5 Units  0-5 Units Subcutaneous QHS Jonah Blue, MD   2 Units at 08/11/17 2245  . insulin glargine (LANTUS) injection 10 Units  10 Units Subcutaneous QHS Marlin Canary U, DO   10 Units at 08/12/17 2221  . metoprolol tartrate (LOPRESSOR) tablet 25 mg   25 mg Oral BID Jonah Blue, MD   25 mg at 08/14/17 5621  . ondansetron (ZOFRAN) injection 4 mg  4 mg Intravenous Q6H PRN Jonah Blue, MD      . pantoprazole (PROTONIX) EC tablet 40 mg  40 mg Oral Daily Jonah Blue, MD   40 mg at 08/14/17 3086  . sodium chloride flush (NS) 0.9 % injection 3 mL  3 mL Intravenous Q12H [name], Deborra Phegley M, MD   3 mL at 08/14/17 5784  . sodium chloride flush (NS) 0.9 % injection 3 mL  3 mL Intravenous PRN [name], Thinh Cuccaro M, MD        Medications Prior to Admission  Medication Sig Dispense Refill Last Dose  . cholecalciferol (VITAMIN D) 1000 UNITS tablet Take 1,000 Units by mouth at bedtime.    08/08/2017 at Unknown time  . clopidogrel (PLAVIX) 75 MG tablet TAKE 1 TABLET BY MOUTH EVERY DAY (Patient taking differently: TAKE 75 mg TABLET BY MOUTH EVERY DAY) 90 tablet 3 08/09/2017 at 0915  . glimepiride (AMARYL) 2 MG tablet Take 2 mg daily by mouth.    08/09/2017 at Unknown time  . insulin NPH-regular Human (NOVOLIN 70/30) (70-30) 100 UNIT/ML injection Inject 30-34 Units 2 (two) times daily with a meal into the skin. Take 42 units in the morning 20 units in the evening   08/08/2017 at Unknown time  . lisinopril-hydrochlorothiazide (PRINZIDE,ZESTORETIC) 20-25 MG per tablet Take 1 tablet by mouth daily.   08/09/2017 at Unknown time  . metoprolol tartrate (LOPRESSOR) 25 MG tablet Take 1 tablet (25 mg total) by mouth 2 (two) times daily. 60 tablet 11 08/09/2017 at Unknown time  . pantoprazole (PROTONIX) 40 MG tablet Take 1 tablet (40 mg total) by mouth daily. 90 tablet 3 08/09/2017 at Unknown time  . simvastatin (ZOCOR) 40 MG tablet TAKE 1 TABLET BY MOUTH AT BEDTIME. (Patient taking differently: TAKE 40 mg TABLET BY MOUTH AT BEDTIME.) 90 tablet 2 08/08/2017 at Unknown time  . warfarin (COUMADIN) 7.5 MG tablet TAKE 1/2 TO 1 TABLET DAILY AS DIRECTED BY COUMADIN CLINIC (Patient taking differently: Take 3.75 on monday, wednesday and Friday. Take 7.5 on tuesday thursday  saturday and Sunday) 90 tablet 0 08/08/2017 at 1945    Family History  Problem Relation Age of Onset  . Hypertension Mother   . Coronary artery disease Brother   . Hypertension Sister      Review of Systems:       Cardiac Review of Systems: Y or N  Chest Pain yes]  Resting SOB [no   ] Exertional SOB  [yes]  Orthopnea [no]   Pedal Edema [yes]    Palpitations [yes] Syncope  [yes]   Presyncope [yes]  General Review of Systems: [Y] = yes [  ]=no Constitional: recent weight change [  ]; anorexia [  ]; fatigue [yes]; nausea [  ]; night sweats [  ]; fever [  ]; or chills [  ]  Dental: poor dentition[  ]; Last Dentist visit: One year  Eye : blurred vision [  ]; diplopia [   ]; vision changes [mild from diabetes];  Amaurosis fugax[  ]; Resp: cough [  ];  wheezing[  ];  hemoptysis[  ]; shortness of breath[yes]; paroxysmal nocturnal dyspnea[  ]; dyspnea on exertion[  ]; or orthopnea[  ];  GI:  gallstones[  ], vomiting[  ];  dysphagia[  ]; melena[  ];  hematochezia [  ]; heartburn[yes GERD history of dysphagia];   Hx of  Colonoscopy[  ]; GU: kidney stones [  ]; hematuria[  ];   dysuria [  ];  nocturia[  ];  history of     obstruction [  ]; urinary frequency [yes]             Skin: rash, swelling[  ];, hair loss[  ];  peripheral edema[  ];  or itching[  ]; Musculosketetal: myalgias[  ];  joint swelling[  ];  joint erythema[  ];  joint pain[yes left knee chronic arthritis];  back pain[  ];  Heme/Lymph: bruising[  ];  bleeding[  ];  anemia[  ];  Neuro: TIA[  ];  headaches[  ];  stroke[  ];  vertigo[  ];  seizures[  ];   paresthesias[  ];  difficulty walking[  ]; no stroke history carotid Dopplers pending  Psych:depression[  ]; anxiety[  ];  Endocrine: diabetes[yes];  thyroid dysfunction[  ];  Immunizations: Flu [  ]; Pneumococcal[  ];  Other: Right-hand-dominant  Physical Exam: BP (!) 118/43 (BP Location: Left Arm)   Pulse 61   Temp  97.8 F (36.6 C) (Oral)   Resp 20   Ht 5\' 3"  (1.6 m)   Wt 276 lb 6.4 oz (125.4 kg)   SpO2 98%   BMI 48.96 kg/m         Exam    General- alert and comfortable    Neck-no JVD mass or bruit   Lungs- clear without rales, wheezes   Cor- regular rate and rhythm, no murmur , gallop   Abdomen- soft, non-tender   Extremities - warm, non-tender, mild bilateral edema, large surgical scar over left knee   Neuro- oriented, appropriate, no focal weakness   Diagnostic Studies & Laboratory data:     Recent Radiology Findings:   Dg Chest 2 View  Result Date: 08/13/2017 CLINICAL DATA:  Chest pain EXAM: CHEST  2 VIEW COMPARISON:  08/09/2017 FINDINGS: Cardiomegaly with vascular congestion mild interstitial prominence could reflect mild interstitial edema. No visible effusions or acute bony abnormality. Surgical changes in the left hilum. Left cardiac stent noted. IMPRESSION: Cardiomegaly with vascular congestion and interstitial prominence, possibly interstitial edema. Electronically Signed   By: Charlett Nose M.D.   On: 08/13/2017 11:01   [place] Rt Upper Extrem Ltd Soft Tissue Non Vascular  Result Date: 08/13/2017 CLINICAL DATA:  Hematoma. EXAM: ULTRASOUND RIGHT UPPER EXTREMITY LIMITED TECHNIQUE: Ultrasound examination of the upper extremity soft tissues was performed in the area of clinical concern. COMPARISON:  None. FINDINGS: Edema within the right upper arm surrounding site of prior venous puncture. No discrete hematoma. No drainable fluid collection. IMPRESSION: Edema within the right upper arm without discrete hematoma. Electronically Signed   By: Obie Dredge M.D.   On: 08/13/2017 09:37     I have independently reviewed the above radiologic studies.  Recent Lab Findings: Lab Results  Component Value Date   WBC 8.7 08/14/2017   HGB 10.1 (L) 08/14/2017  HCT 30.7 (L) 08/14/2017   PLT 207 08/14/2017   GLUCOSE 202 (H) 08/13/2017   CHOL 133 08/10/2017   TRIG 78 08/10/2017   HDL 46  08/10/2017   LDLDIRECT 143 (H) 10/10/2008   LDLCALC 71 08/10/2017   ALT 15 08/13/2017   AST 17 08/13/2017   NA 136 08/13/2017   K 3.7 08/13/2017   CL 104 08/13/2017   CREATININE 0.98 08/13/2017   BUN 12 08/13/2017   CO2 25 08/13/2017   TSH 1.744 08/13/2017   INR 1.22 08/14/2017   HGBA1C 7.9 (H) 08/13/2017      Assessment / Plan:        P2 Y12 assay shows plt fx depressed - plavix on hold. Repeat p2y12  11-19 hrs Coumadin on hold for chronic PAF, INR 1.4 Earliest opening in OR schedule next wed 11-21 She needs LIMA to LAD- no vein avail for conduit Since she has had L thoracotomy in past wil need CARDIAC CTA to document patency of LIMA prior to surgery  Physical therapy to assess patient's mobility prior to surgery       @ME1 @ 08/14/2017 12:25 PM

## 2017-08-14 NOTE — Plan of Care (Signed)
  Coping: Level of anxiety will decrease 08/14/2017 2336 - Progressing by Ruben Im, RN

## 2017-08-14 NOTE — Plan of Care (Signed)
  Progressing Clinical Measurements: Ability to maintain clinical measurements within normal limits will improve 08/14/2017 0031 - Progressing by Ruben Im, RN Diagnostic test results will improve 08/14/2017 0031 - Progressing by Ruben Im, RN Cardiovascular complication will be avoided 08/14/2017 0031 - Progressing by Ruben Im, RN

## 2017-08-14 NOTE — Evaluation (Addendum)
Physical Therapy Evaluation Patient Details Name: [name] MRN: [id number] DOB: [birth year] Today's Date: 08/14/2017   History of Present Illness  Pt is a 66 y.o. female with medical history significant of IDDM; OSA not on CPAP; CAD; PVD; afib on Coumadin; remote lung cancer; HTN; HLD; and s/p L TKA. She was admitted with chest pain. Pt is tentatively scheduled to undergo CABG 08-17-17.    Clinical Impression  Pt admitted with above diagnosis. Pt currently with functional limitations due to the deficits listed below (see PT Problem List). On eval, pt required min guard assist transfers and ambulation 200 feet with RW. Max HR 102 during mobility. Pt will benefit from skilled PT to increase their independence and safety with mobility to allow discharge to the venue listed below.  Post-surgical goals established. Follow up recommendations to be established after surgery (CABG).     Follow Up Recommendations Other (comment)(TBD following CABG)    Equipment Recommendations  None recommended by PT    Recommendations for Other Services       Precautions / Restrictions Precautions Precautions: None      Mobility  Bed Mobility Overal bed mobility: Modified Independent             General bed mobility comments: +rail, increased time and effort  Transfers Overall transfer level: Needs assistance Equipment used: Rolling walker (2 wheeled) Transfers: Sit to/from Stand Sit to Stand: Min guard         General transfer comment: min guard assist for safety. Pt utilized rocking technique EOB for momentum to power up to stance. Increased time to stabilize initial standing balance.  Ambulation/Gait Ambulation/Gait assistance: Min guard Ambulation Distance (Feet): 200 Feet Assistive device: Rolling walker (2 wheeled) Gait Pattern/deviations: Step-through pattern;Antalgic;Trunk flexed;Wide base of support Gait velocity: decreased Gait velocity interpretation: Below normal  speed for age/gender General Gait Details: Wide BOS and flexed trunk due to body habitus. Max HR 102 during ambulation.  Stairs            Wheelchair Mobility    Modified Rankin (Stroke Patients Only)       Balance Overall balance assessment: Needs assistance Sitting-balance support: No upper extremity supported;Feet supported Sitting balance-Leahy Scale: Good     Standing balance support: Bilateral upper extremity supported;During functional activity Standing balance-Leahy Scale: Fair Standing balance comment: RW needed for ambulation.                             Pertinent Vitals/Pain Pain Assessment: 0-10 Pain Score: 10-Worst pain ever Pain Location: L knee Pain Descriptors / Indicators: Sore Pain Intervention(s): Monitored during session(Pt reports pain 'eased off' with ambulation.)    Home Living Family/patient expects to be discharged to:: Private residence Living Arrangements: Children(son) Available Help at Discharge: Family;Available 24 hours/day Type of Home: House Home Access: Level entry     Home Layout: One level Home Equipment: Bedside commode;Walker - 2 wheels;Cane - quad;Shower seat;Grab bars - tub/shower      Prior Function Level of Independence: Needs assistance   Gait / Transfers Assistance Needed: Ambulates mod I with RW. Drives herself to MD appts.  ADL's / Homemaking Assistance Needed: Mod I with self care. Assist for IADLs. Son does cooking and cleaning.        Hand Dominance        Extremity/Trunk Assessment   Upper Extremity Assessment Upper Extremity Assessment: Overall WFL for tasks assessed    Lower Extremity Assessment Lower Extremity  Assessment: Overall WFL for tasks assessed    Cervical / Trunk Assessment Cervical / Trunk Assessment: Normal  Communication   Communication: No difficulties  Cognition Arousal/Alertness: Awake/alert Behavior During Therapy: WFL for tasks assessed/performed Overall  Cognitive Status: Within Functional Limits for tasks assessed                                        General Comments      Exercises     Assessment/Plan    PT Assessment Patient needs continued PT services  PT Problem List Decreased mobility;Obesity;Cardiopulmonary status limiting activity;Decreased activity tolerance;Decreased balance;Pain       PT Treatment Interventions Gait training;Therapeutic activities;Therapeutic exercise;Patient/family education;Balance training;Functional mobility training    PT Goals (Current goals can be found in the Care Plan section)  Acute Rehab PT Goals Patient Stated Goal: not stated PT Goal Formulation: With patient Time For Goal Achievement: 08/28/17 Potential to Achieve Goals: Good    Frequency Min 3X/week   Barriers to discharge        Co-evaluation               AM-PAC PT "6 Clicks" Daily Activity  Outcome Measure Difficulty turning over in bed (including adjusting bedclothes, sheets and blankets)?: A Little Difficulty moving from lying on back to sitting on the side of the bed? : A Little Difficulty sitting down on and standing up from a chair with arms (e.g., wheelchair, bedside commode, etc,.)?: A Lot Help needed moving to and from a bed to chair (including a wheelchair)?: None Help needed walking in hospital room?: None Help needed climbing 3-5 steps with a railing? : A Lot 6 Click Score: 18    End of Session Equipment Utilized During Treatment: Gait belt Activity Tolerance: Patient tolerated treatment well Patient left: in bed;with call bell/phone within reach Nurse Communication: Mobility status PT Visit Diagnosis: Difficulty in walking, not elsewhere classified (R26.2);Pain Pain - Right/Left: Left Pain - part of body: Knee    Time: 1275-1700 PT Time Calculation (min) (ACUTE ONLY): 16 min   Charges:   PT Evaluation $PT Eval Moderate Complexity: 1 Mod     PT G Codes:        Lorrin Goodell,  PT  Office # [phone] Pager [phone]   Lorriane Shire 08/14/2017, 3:42 PM

## 2017-08-14 NOTE — Consult Note (Signed)
[address]       [place] [address]             [phone]        [name] [facility] Medical Record #[id number] Date of Birth: 1951-08-28  Referring:Karington Zarazua [place] MD Primary Care: Georgianne Fick, MD  Chief Complaint:    Chief Complaint  Patient presents with  . Chest Pain  Patient examined, most recent coronary angiogram images personally reviewed and counseled with patient  History of Present Illness:     [name] is a 66 y.o. female with medical history significant of IDDM; OSA not on CPAP; CAD; PVD; afib on Coumadin; remote lung cancer; HTN; and HLD presenting with chest pain starting at about 12pm today.  She was sitting down when it started.  Substernal in nature.  The pain lasted about 2 minutes.  It resolved spontaneously.  After that she felt like she indigestion.  No SOB, diaphoresis.  She has had similar symptoms before when she had restenosed artery.  Last saw cardiology in May of this year.   Last cath prior to this admission was 2017 -Ost - Proximal LAD lesion, 80% and 99% in-stent restenosis. Post intervention with scoring balloon angioplasty followed by a Synergy DES stent covering the entire stented segment., there is a 0% residual stenosis. The lesion was previously treated with a 2 overlapping DES stents in 2013 and 2015. -Prox Cx lesion, 50% stenosed. Stable -Ramus lesion, 20% stenosed. -The left ventricular systolic function was normal  The patient has been on long-term Plavix.  She has had LAD procedures in 2013, 2015, 2017 and now returns with 90% in-stent stenosis.  With injected nitroglycerin the distal LAD shows adequate size and improved filling.  In 2010 the patient had left thoracotomy and wedge resection of lingular and left lower lobe small adenocarcinoma stage I which further therapy was not performed.  Lymph nodes were negative.  Last CT scan of chest was 2017 without evidence of recurrence, no abnormal  mediastinal adenopathy, with post surgical changes at the left lung base  Patient has had left total knee replacement with several revisions and uses a rolling walker to get around her home.  Echocardiogram is pending but LV function is normal by ventriculogram and LVEDP is 14  PFTs have been performed showing adequate mechanics and diffusion capacity for sternotomy.  Room air PO2 60, PCO2 41, pH 7.4  Initial P2 Y 12 assay is 216, hemoglobin A1c 7.9, creatinine 0.9, hemoglobin 10.8  Current Activity/ Functional Status: Functional status is limited by her obesity and left knee arthritis   Zubrod Score: At the time of surgery this patient's most appropriate activity status/level should be described as: []     0    Normal activity, no symptoms []     1    Restricted in physical strenuous activity but ambulatory, able to do out light work [x]     2    Ambulatory and capable of self care, unable to do work activities, up and about                 more than 50%  Of the time                            []     3    Only limited self care, in bed greater than 50% of waking hours []     4  Completely disabled, no self care, confined to bed or chair []     5    Moribund  Past Medical History:  Diagnosis Date  . Arthritis    "knees" (02/05/2016)  . Cancer (HCC) 5/10   LUL Vats   . Coronary artery disease   . GERD (gastroesophageal reflux disease)    tx meds  . H/O hiatal hernia   . Heart palpitations    monitor 04/19/12-05/10/12- frequent PAF with RVR- coumadin started  . History of blood transfusion "several"   last april 2015 s/p knee surgery (02/05/2016)  . Hypercholesteremia   . Hypertension   . Lung cancer (HCC)    2010, surgery 18% left lung no radiation or chemo  . Non-STEMI (non-ST elevated myocardial infarction) (HCC) 04/07/2012   See cath results below  . Paroxysmal atrial fibrillation (HCC) 04/07/2012   On Warfarin  . Peripheral vascular disease (HCC) 8/12   Lt SFA PTA  . Presence  of stent in LAD coronary artery 04/07/2012   Xience Expedition DES 2.75 mm x 18 mm (dilated to 3.0 mm)  . Sleep apnea    does not wear CPAP  . Type II diabetes mellitus (HCC)    insulin dependent    Past Surgical History:  Procedure Laterality Date  . ABDOMINAL HYSTERECTOMY  1990  . BREAST EXCISIONAL BIOPSY Right   . CARDIAC CATHETERIZATION  11/04/2008   patent RCA, LM, and Circ, nl EF  . CARDIAC CATHETERIZATION  02/20/2002   patent coronaries with the only abnormality being a smooth luminla irregularity in the mid intermediate ramus branch no felt to be hemodynamically significant, nl LV  . CATARACT EXTRACTION W/ INTRAOCULAR LENS  IMPLANT, BILATERAL  2013  . CORONARY ANGIOPLASTY WITH STENT PLACEMENT  04/07/2012   Xience Expedition DES 2.85mm x 18 mm (dilated to 3.0 mm) to the prox LAD   . CORONARY ANGIOPLASTY WITH STENT PLACEMENT  2013; 2015  . Coronary Stent Intervention N/A 02/05/2016   Performed by Marykay Lex, MD at [facility] INVASIVE CV LAB  . DILATION AND CURETTAGE OF UTERUS  <1990  . ESOPHAGOGASTRODUODENOSCOPY (EGD) N/A 05/23/2015   Performed by Jeani Hawking, MD at [facility] ENDOSCOPY  . ESOPHAGOGASTRODUODENOSCOPY (EGD) WITH PROPOFOL N/A 06/13/2015   Performed by Jeani Hawking, MD at [facility] ENDOSCOPY  . FRACTURE SURGERY    . HAMMER TOE SURGERY Bilateral 1993   with screws, on screw removed  . JOINT REPLACEMENT    . KNEE ARTHROSCOPY Bilateral    left x2, right x1  . LAPAROSCOPIC CHOLECYSTECTOMY    . LEFT HEART CATH AND CORONARY ANGIOGRAPHY N/A 08/11/2017   Performed by [place], Chais Fehringer M, MD at [facility] INVASIVE CV LAB  . Left Heart Cath and Coronary Angiography N/A 02/05/2016   Performed by Marykay Lex, MD at [facility] INVASIVE CV LAB  . LEFT HEART CATHETERIZATION WITH CORONARY ANGIOGRAM N/A 05/27/2014   Performed by Runell Gess, MD at [facility] CATH LAB  . LEFT HEART CATHETERIZATION WITH CORONARY ANGIOGRAM N/A 04/07/2012   Performed by Runell Gess, MD at [facility] CATH LAB  . LEFT TOTAL KNEE RESECTION  Left 11/05/2013   Performed by Shelda Pal, MD at [facility] ORS  . LOWER EXTREMITY ANGIOGRAM  05/17/11   directional atherectomy to the prox L SFA using a LX Man TurboHawk, ballooned with a [employer] balloon   . OPEN REDUCTION INTERNAL FIXATION (ORIF) ANKLE FRACTURE Left 07/09/2012   Performed by Cammy Copa, MD at [facility] ORS  . PERCUTANEOUS CORONARY  STENT INTERVENTION (PCI-S)  05/27/2014   Performed by Runell Gess, MD at [facility] CATH LAB  . PERCUTANEOUS CORONARY STENT INTERVENTION (PCI-S) N/A 04/07/2012   Performed by Runell Gess, MD at [facility] CATH LAB  . RE-INPLANTATION LEFT TOTAL KNEE Left 12/31/2013   Performed by Shelda Pal, MD at [facility] ORS  . SAVORY DILATION N/A 06/13/2015   Performed by Jeani Hawking, MD at [facility] ENDOSCOPY  . TONSILLECTOMY    . TOTAL KNEE ARTHROPLASTY Left 2003  . TOTAL KNEE REVISION Left 2007   opened in 2006 and cleaned out, 2007 revision  . VIDEO ASSISTED THORACOSCOPY (VATS)/ LOBECTOMY  01/2009    Social History   Tobacco Use  Smoking Status Former Smoker  . Packs/day: 1.00  . Years: 20.00  . Pack years: 20.00  . Types: Cigarettes  . Last attempt to quit: 09/27/1984  . Years since quitting: 32.9  Smokeless Tobacco Never Used    Social History   Substance and Sexual Activity  Alcohol Use No    Social History   Socioeconomic History  . Marital status: Single    Spouse name: Not on file  . Number of children: Not on file  . Years of education: Not on file  . Highest education level: Not on file  Social Needs  . Financial resource strain: Not on file  . Food insecurity - worry: Not on file  . Food insecurity - inability: Not on file  . Transportation needs - medical: Not on file  . Transportation needs - non-medical: Not on file  Occupational History  . Occupation: Retired  Tobacco Use  . Smoking status: Former Smoker    Packs/day: 1.00    Years: 20.00    Pack years: 20.00    Types: Cigarettes    Last attempt to quit: 09/27/1984    Years  since quitting: 32.9  . Smokeless tobacco: Never Used  Substance and Sexual Activity  . Alcohol use: No  . Drug use: No  . Sexual activity: No    Birth control/protection: Post-menopausal  Other Topics Concern  . Not on file  Social History Narrative   Patient lives alone.    Allergies  Allergen Reactions  . Levofloxacin Itching  . Morphine And Related Itching    Current Facility-Administered Medications  Medication Dose Route Frequency Provider Last Rate Last Dose  . 0.9 %  sodium chloride infusion  250 mL Intravenous PRN [place], Thereasa Iannello M, MD      . acetaminophen (TYLENOL) tablet 650 mg  650 mg Oral Q4H PRN Jonah Blue, MD      . alum & mag hydroxide-simeth (MAALOX/MYLANTA) 200-200-20 MG/5ML suspension 15 mL  15 mL Oral Q6H PRN Dow Adolph N, DO   15 mL at 08/13/17 2342  . aspirin EC tablet 81 mg  81 mg Oral Daily Jonah Blue, MD   81 mg at 08/14/17 2952  . atorvastatin (LIPITOR) tablet 40 mg  40 mg Oral q1800 Jonah Blue, MD   40 mg at 08/13/17 1715  . gi cocktail (Maalox,Lidocaine,Donnatal)  30 mL Oral QID PRN Jonah Blue, MD      . heparin ADULT infusion 100 units/mL (25000 units/211mL sodium chloride 0.45%)  1,100 Units/hr Intravenous Continuous Quenton Fetter, RPH 11 mL/hr at 08/14/17 0929 1,100 Units/hr at 08/14/17 0929  . lisinopril (PRINIVIL,ZESTRIL) tablet 20 mg  20 mg Oral Daily Jonah Blue, MD   20 mg at 08/14/17 8413   And  . hydrochlorothiazide (HYDRODIURIL) tablet 25 mg  25 mg Oral Daily Jonah Blue, MD   25 mg at 08/14/17 7829  . insulin aspart (novoLOG) injection 0-15 Units  0-15 Units Subcutaneous TID WC Jonah Blue, MD   5 Units at 08/14/17 1151  . insulin aspart (novoLOG) injection 0-5 Units  0-5 Units Subcutaneous QHS Jonah Blue, MD   2 Units at 08/11/17 2245  . insulin glargine (LANTUS) injection 10 Units  10 Units Subcutaneous QHS Marlin Canary U, DO   10 Units at 08/12/17 2221  . metoprolol tartrate (LOPRESSOR) tablet 25 mg   25 mg Oral BID Jonah Blue, MD   25 mg at 08/14/17 5621  . ondansetron (ZOFRAN) injection 4 mg  4 mg Intravenous Q6H PRN Jonah Blue, MD      . pantoprazole (PROTONIX) EC tablet 40 mg  40 mg Oral Daily Jonah Blue, MD   40 mg at 08/14/17 3086  . sodium chloride flush (NS) 0.9 % injection 3 mL  3 mL Intravenous Q12H [place], Deborra Phegley M, MD   3 mL at 08/14/17 5784  . sodium chloride flush (NS) 0.9 % injection 3 mL  3 mL Intravenous PRN [place], Thinh Cuccaro M, MD        Medications Prior to Admission  Medication Sig Dispense Refill Last Dose  . cholecalciferol (VITAMIN D) 1000 UNITS tablet Take 1,000 Units by mouth at bedtime.    08/08/2017 at Unknown time  . clopidogrel (PLAVIX) 75 MG tablet TAKE 1 TABLET BY MOUTH EVERY DAY (Patient taking differently: TAKE 75 mg TABLET BY MOUTH EVERY DAY) 90 tablet 3 08/09/2017 at 0915  . glimepiride (AMARYL) 2 MG tablet Take 2 mg daily by mouth.    08/09/2017 at Unknown time  . insulin NPH-regular Human (NOVOLIN 70/30) (70-30) 100 UNIT/ML injection Inject 30-34 Units 2 (two) times daily with a meal into the skin. Take 42 units in the morning 20 units in the evening   08/08/2017 at Unknown time  . lisinopril-hydrochlorothiazide (PRINZIDE,ZESTORETIC) 20-25 MG per tablet Take 1 tablet by mouth daily.   08/09/2017 at Unknown time  . metoprolol tartrate (LOPRESSOR) 25 MG tablet Take 1 tablet (25 mg total) by mouth 2 (two) times daily. 60 tablet 11 08/09/2017 at Unknown time  . pantoprazole (PROTONIX) 40 MG tablet Take 1 tablet (40 mg total) by mouth daily. 90 tablet 3 08/09/2017 at Unknown time  . simvastatin (ZOCOR) 40 MG tablet TAKE 1 TABLET BY MOUTH AT BEDTIME. (Patient taking differently: TAKE 40 mg TABLET BY MOUTH AT BEDTIME.) 90 tablet 2 08/08/2017 at Unknown time  . warfarin (COUMADIN) 7.5 MG tablet TAKE 1/2 TO 1 TABLET DAILY AS DIRECTED BY COUMADIN CLINIC (Patient taking differently: Take 3.75 on monday, wednesday and Friday. Take 7.5 on tuesday thursday  saturday and Sunday) 90 tablet 0 08/08/2017 at 1945    Family History  Problem Relation Age of Onset  . Hypertension Mother   . Coronary artery disease Brother   . Hypertension Sister      Review of Systems:       Cardiac Review of Systems: Y or N  Chest Pain yes]  Resting SOB [no   ] Exertional SOB  [yes]  Orthopnea [no]   Pedal Edema [yes]    Palpitations [yes] Syncope  [yes]   Presyncope [yes]  General Review of Systems: [Y] = yes [  ]=no Constitional: recent weight change [  ]; anorexia [  ]; fatigue [yes]; nausea [  ]; night sweats [  ]; fever [  ]; or chills [  ]  Dental: poor dentition[  ]; Last Dentist visit: One year  Eye : blurred vision [  ]; diplopia [   ]; vision changes [mild from diabetes];  Amaurosis fugax[  ]; Resp: cough [  ];  wheezing[  ];  hemoptysis[  ]; shortness of breath[yes]; paroxysmal nocturnal dyspnea[  ]; dyspnea on exertion[  ]; or orthopnea[  ];  GI:  gallstones[  ], vomiting[  ];  dysphagia[  ]; melena[  ];  hematochezia [  ]; heartburn[yes GERD history of dysphagia];   Hx of  Colonoscopy[  ]; GU: kidney stones [  ]; hematuria[  ];   dysuria [  ];  nocturia[  ];  history of     obstruction [  ]; urinary frequency [yes]             Skin: rash, swelling[  ];, hair loss[  ];  peripheral edema[  ];  or itching[  ]; Musculosketetal: myalgias[  ];  joint swelling[  ];  joint erythema[  ];  joint pain[yes left knee chronic arthritis];  back pain[  ];  Heme/Lymph: bruising[  ];  bleeding[  ];  anemia[  ];  Neuro: TIA[  ];  headaches[  ];  stroke[  ];  vertigo[  ];  seizures[  ];   paresthesias[  ];  difficulty walking[  ]; no stroke history carotid Dopplers pending  Psych:depression[  ]; anxiety[  ];  Endocrine: diabetes[yes];  thyroid dysfunction[  ];  Immunizations: Flu [  ]; Pneumococcal[  ];  Other: Right-hand-dominant  Physical Exam: BP (!) 118/43 (BP Location: Left Arm)   Pulse 61   Temp  97.8 F (36.6 C) (Oral)   Resp 20   Ht 5\' 3"  (1.6 m)   Wt 276 lb 6.4 oz (125.4 kg)   SpO2 98%   BMI 48.96 kg/m         Exam    General- alert and comfortable    Neck-no JVD mass or bruit   Lungs- clear without rales, wheezes   Cor- regular rate and rhythm, no murmur , gallop   Abdomen- soft, non-tender   Extremities - warm, non-tender, mild bilateral edema, large surgical scar over left knee   Neuro- oriented, appropriate, no focal weakness   Diagnostic Studies & Laboratory data:     Recent Radiology Findings:   Dg Chest 2 View  Result Date: 08/13/2017 CLINICAL DATA:  Chest pain EXAM: CHEST  2 VIEW COMPARISON:  08/09/2017 FINDINGS: Cardiomegaly with vascular congestion mild interstitial prominence could reflect mild interstitial edema. No visible effusions or acute bony abnormality. Surgical changes in the left hilum. Left cardiac stent noted. IMPRESSION: Cardiomegaly with vascular congestion and interstitial prominence, possibly interstitial edema. Electronically Signed   By: Charlett Nose M.D.   On: 08/13/2017 11:01   [place] Rt Upper Extrem Ltd Soft Tissue Non Vascular  Result Date: 08/13/2017 CLINICAL DATA:  Hematoma. EXAM: ULTRASOUND RIGHT UPPER EXTREMITY LIMITED TECHNIQUE: Ultrasound examination of the upper extremity soft tissues was performed in the area of clinical concern. COMPARISON:  None. FINDINGS: Edema within the right upper arm surrounding site of prior venous puncture. No discrete hematoma. No drainable fluid collection. IMPRESSION: Edema within the right upper arm without discrete hematoma. Electronically Signed   By: Obie Dredge M.D.   On: 08/13/2017 09:37     I have independently reviewed the above radiologic studies.  Recent Lab Findings: Lab Results  Component Value Date   WBC 8.7 08/14/2017   HGB 10.1 (L) 08/14/2017  HCT 30.7 (L) 08/14/2017   PLT 207 08/14/2017   GLUCOSE 202 (H) 08/13/2017   CHOL 133 08/10/2017   TRIG 78 08/10/2017   HDL 46  08/10/2017   LDLDIRECT 143 (H) 10/10/2008   LDLCALC 71 08/10/2017   ALT 15 08/13/2017   AST 17 08/13/2017   NA 136 08/13/2017   K 3.7 08/13/2017   CL 104 08/13/2017   CREATININE 0.98 08/13/2017   BUN 12 08/13/2017   CO2 25 08/13/2017   TSH 1.744 08/13/2017   INR 1.22 08/14/2017   HGBA1C 7.9 (H) 08/13/2017      Assessment / Plan:        P2 Y12 assay shows plt fx depressed - plavix on hold. Repeat p2y12  11-19 hrs Coumadin on hold for chronic PAF, INR 1.4 Earliest opening in OR schedule next wed 11-21 She needs LIMA to LAD- no vein avail for conduit Since she has had L thoracotomy in past wil need CARDIAC CTA to document patency of LIMA prior to surgery  Physical therapy to assess patient's mobility prior to surgery       @ME1 @ 08/14/2017 12:25 PM

## 2017-08-14 NOTE — Progress Notes (Signed)
PROGRESS NOTE  ANALYNN [name] ELF:[id number] DOB: July 19, 1951 DOA: 08/09/2017 PCP: [name], MD  HPI/Recap of past 24 hours: Brandi A Farringtonis a 66 y.o.femalewith significant PMH of CAD s/p LAD stent with 2 episodes of re-stenosis, HTN, HLD, T2DM, GERD with esophageal stricture presents with chest pain similar to prior episodes of stent re-stenosis. Negative troponins and unchanged EKG from priors. Cardiology consulted, s/p left cath with coronary angiography 08/11/17. Cardiology recommends CTS evaluation for possible CABG. Holding off coumadin and plavix due to possible surgical intervention on Monday or Tuesday.  Currently on heparin drip and will continue. Possible CABG planned on Wednesday 08/17/17. Patient seen and examined at her bedside. No acute events overnight. Denies any chest pain, dyspnea or palpitations.     Assessment/Plan: Principal Problem:   Chest pain Active Problems:   Essential hypertension   Paroxysmal atrial fibrillation   Unstable angina (HCC)   CAD s/p LAD DES 2013, 2015 and 2017   Dyslipidemia   Chronic anticoagulation   DM (diabetes mellitus), type 2 with peripheral vascular complications (HCC)   CKD (chronic kidney disease) stage 3, GFR 30-59 ml/min (HCC)   Chest pain 2/2 ACS -ACS ruled in -cardiology following -s/p left cath and coronary angiography 08/11/17 -Single vessel obstructive CAD with severe in stent restenosis in the proximal LAD -Normal LV function -Normal LVEDP -patient has recurrent restenosis in a large proximal LAD which has been stented multiple times in the past. -possible CABG on Wednesday 08/17/17 -continue to hold Plavix and coumadin.  -continue IV heparin. Pharmacy team management appreciated  CAD s/p prox LAD stent with recurrent in-stent restenosis: -hold plavix 75mg  daily and coumadin due to possible surgical intervention CABG 08/17/17 -continue lisinopril 20mg  daily, metoprolol 25mg  BID, lipitor 40 mg  daily  Paroxismal A-fib -sinus rhythm currently -previously on warfarin for anticoagulation; changed to heparin due to possible surgical intervention on wednesday  Subtherapeutic INR -previous on coumadin which is being held. -continue heparin drip for possible CABG   Chronic normocytic anemia -stable -hg 10.1 -cbc am  Hyponatremia -resolved Na+ 136 -BMP am on Monday 08/15/17  HTN: -Stable -Continue lisinopril, HCTZ, metoprolol  T2DM: -A1c 7.9 (08/13/17) - insulin SS with hypoglycemia protocol  CKD stage 2 -Stable. -cr 0.98  GERD -stable -protonix -GI cocktail prn   Code Status: Full  Family Communication: No family members at bedside. Has a son who works in the [employer] at [employer].  Disposition Plan: Will stay another midnight to continue close monitoring and heparin infusion with anticipation of possible CABG on Wednesday 08/17/17.   Consultants:  Cardiology  Procedures:  Left cardiac cath through right radial access  Antimicrobials:  none  DVT prophylaxis:  heparin drip   Objective: Vitals:   08/13/17 1154 08/13/17 1700 08/13/17 1932 08/14/17 0547  BP: (!) 157/73 130/70 (!) 125/51   Pulse: 84 86 67 63  Resp: 18 18 18 18   Temp: 98.2 F (36.8 C)  98.5 F (36.9 C) 98.1 F (36.7 C)  TempSrc: Oral  Oral Oral  SpO2: 100% 100% 100% 99%  Weight:    125.4 kg (276 lb 6.4 oz)  Height:        Intake/Output Summary (Last 24 hours) at 08/14/2017 0738 Last data filed at 08/14/2017 0500 Gross per 24 hour  Intake 1189.13 ml  Output 2575 ml  Net -1385.87 ml   Filed Weights   08/12/17 0419 08/13/17 0611 08/14/17 0547  Weight: 126.6 kg (279 lb 1.6 oz) 125.6 kg (276 lb 12.8  oz) 125.4 kg (276 lb 6.4 oz)    Exam:   General:  66 yo AAF WD WN in NAD   Cardiovascular: RRR with no rubs or gallops   Respiratory: CTA with no wheezes or rales  Abdomen: soft obese NT ND with NBS x 4 quadrants  Musculoskeletal: moves all limbs. Right  upper arm hematoma from previous venous puncture site. Right radial access appears clean with no drainage noted  Skin: bruising noted from venous puncture sites and hematoma as stated above  Psychiatry: Mood is appropriate for condition and setting  Data Reviewed: CBC: Recent Labs  Lab 08/09/17 1341 08/11/17 0228 08/12/17 0450 08/13/17 0331 08/14/17 0519  WBC 8.4 8.9 8.2 8.2 8.7  HGB 12.7 10.7* 11.0* 10.8* 10.1*  HCT 38.3 32.6* 32.8* 32.6* 30.7*  MCV 86.5 85.6 85.0 85.1 85.0  PLT 275 217 217 216 025   Basic Metabolic Panel: Recent Labs  Lab 08/09/17 1341 08/11/17 0228 08/13/17 0331  NA 133* 133* 136  K 4.4 4.1 3.7  CL 99* 101 104  CO2 27 24 25   GLUCOSE 256* 219* 202*  BUN 16 19 12   CREATININE 1.10* 1.15* 0.98  CALCIUM 9.6 8.6* 9.0   GFR: Estimated Creatinine Clearance: 71.8 mL/min (by C-G formula based on SCr of 0.98 mg/dL). Liver Function Tests: Recent Labs  Lab 08/13/17 0331  AST 17  ALT 15  ALKPHOS 74  BILITOT 0.5  PROT 6.6  ALBUMIN 2.9*   No results for input(s): LIPASE, AMYLASE in the last 168 hours. No results for input(s): AMMONIA in the last 168 hours. Coagulation Profile: Recent Labs  Lab 08/11/17 0228 08/11/17 1322 08/12/17 0450 08/13/17 0331 08/14/17 0519  INR 2.10 1.81 1.59 1.40 1.22   Cardiac Enzymes: Recent Labs  Lab 08/10/17 1141 08/10/17 1629 08/10/17 2216  TROPONINI <0.03 <0.03 <0.03   BNP (last 3 results) No results for input(s): PROBNP in the last 8760 hours. HbA1C: Recent Labs    08/13/17 0331  HGBA1C 7.9*   CBG: Recent Labs  Lab 08/13/17 1625 08/13/17 2106 08/13/17 2157 08/14/17 0206 08/14/17 0723  GLUCAP 277* 75 101* 219* 152*   Lipid Profile: No results for input(s): CHOL, HDL, LDLCALC, TRIG, CHOLHDL, LDLDIRECT in the last 72 hours. Thyroid Function Tests: Recent Labs    08/13/17 0331  TSH 1.744   Anemia Panel: No results for input(s): VITAMINB12, FOLATE, FERRITIN, TIBC, IRON, RETICCTPCT in the last  72 hours. Urine analysis:    Component Value Date/Time   COLORURINE YELLOW 08/13/2017 [place] 08/13/2017 1744   LABSPEC 1.013 08/13/2017 1744   PHURINE 6.0 08/13/2017 1744   GLUCOSEU 150 (A) 08/13/2017 1744   HGBUR SMALL (A) 08/13/2017 1744   BILIRUBINUR NEGATIVE 08/13/2017 1744   KETONESUR NEGATIVE 08/13/2017 1744   PROTEINUR NEGATIVE 08/13/2017 1744   UROBILINOGEN 0.2 12/21/2013 1129   NITRITE NEGATIVE 08/13/2017 1744   LEUKOCYTESUR NEGATIVE 08/13/2017 1744   Sepsis Labs: @LABRCNTIP (procalcitonin:4,lacticidven:4)  ) Recent Results (from the past 240 hour(s))  Surgical PCR screen     Status: None   Collection Time: 08/13/17  4:08 PM  Result Value Ref Range Status   MRSA, PCR NEGATIVE NEGATIVE Final   Staphylococcus aureus NEGATIVE NEGATIVE Final    Comment: (NOTE) The Xpert SA Assay (FDA approved for NASAL specimens in patients 32 years of age and older), is one component of a comprehensive surveillance program. It is not intended to diagnose infection nor to guide or monitor treatment.  Studies: Dg Chest 2 View  Result Date: 08/13/2017 CLINICAL DATA:  Chest pain EXAM: CHEST  2 VIEW COMPARISON:  08/09/2017 FINDINGS: Cardiomegaly with vascular congestion mild interstitial prominence could reflect mild interstitial edema. No visible effusions or acute bony abnormality. Surgical changes in the left hilum. Left cardiac stent noted. IMPRESSION: Cardiomegaly with vascular congestion and interstitial prominence, possibly interstitial edema. Electronically Signed   By: Rolm Baptise M.D.   On: 08/13/2017 11:01    Scheduled Meds: . aspirin EC  81 mg Oral Daily  . atorvastatin  40 mg Oral q1800  . lisinopril  20 mg Oral Daily   And  . hydrochlorothiazide  25 mg Oral Daily  . insulin aspart  0-15 Units Subcutaneous TID WC  . insulin aspart  0-5 Units Subcutaneous QHS  . insulin glargine  10 Units Subcutaneous QHS  . metoprolol tartrate  25 mg Oral BID    . pantoprazole  40 mg Oral Daily  . sodium chloride flush  3 mL Intravenous Q12H    Continuous Infusions: . sodium chloride    . heparin 1,100 Units/hr (08/13/17 1218)     LOS: 3 days     Kayleen Memos, MD [facility] Pager [phone]  If 7PM-7AM, please contact night-coverage [email or web address] Password [facility] 08/14/2017, 7:38 AM

## 2017-08-14 NOTE — Progress Notes (Signed)
IV team tried to give her 18 gaze needle for cardiac CTA but they were not successful to give exactly in the place where it supposed to go for the procedure, Pt is stable, vitals stable, IV heparin continue @11 , will continue to monitor

## 2017-08-15 ENCOUNTER — Inpatient Hospital Stay (HOSPITAL_COMMUNITY): Payer: Medicare HMO

## 2017-08-15 ENCOUNTER — Encounter (HOSPITAL_COMMUNITY): Payer: Medicare HMO

## 2017-08-15 DIAGNOSIS — I2510 Atherosclerotic heart disease of native coronary artery without angina pectoris: Secondary | ICD-10-CM

## 2017-08-15 LAB — GLUCOSE, CAPILLARY
GLUCOSE-CAPILLARY: 177 mg/dL — AB (ref 65–99)
GLUCOSE-CAPILLARY: 213 mg/dL — AB (ref 65–99)
Glucose-Capillary: 229 mg/dL — ABNORMAL HIGH (ref 65–99)
Glucose-Capillary: 213 mg/dL — ABNORMAL HIGH (ref 65–99)

## 2017-08-15 LAB — PROTIME-INR
INR: 1.11
Prothrombin Time: 14.2 seconds (ref 11.4–15.2)

## 2017-08-15 LAB — BASIC METABOLIC PANEL
ANION GAP: 6 (ref 5–15)
BUN: 15 mg/dL (ref 6–20)
CALCIUM: 9.4 mg/dL (ref 8.9–10.3)
CO2: 26 mmol/L (ref 22–32)
Chloride: 104 mmol/L (ref 101–111)
Creatinine, Ser: 0.98 mg/dL (ref 0.44–1.00)
GFR calc Af Amer: >60 mL/min (ref >60)
GFR, EST NON AFRICAN AMERICAN: 58 mL/min — AB (ref >60)
GLUCOSE: 188 mg/dL — AB (ref 65–99)
POTASSIUM: 4.1 mmol/L (ref 3.5–5.1)
Sodium: 136 mmol/L (ref 135–145)

## 2017-08-15 LAB — CBC
HEMATOCRIT: 32.9 % — AB (ref 36.0–46.0)
Hemoglobin: 10.8 g/dL — ABNORMAL LOW (ref 12.0–15.0)
MCH: 28.2 pg (ref 26.0–34.0)
MCHC: 32.8 g/dL (ref 30.0–36.0)
MCV: 85.9 fL (ref 78.0–100.0)
PLATELETS: 231 10*3/uL (ref 150–400)
RBC: 3.83 MIL/uL — ABNORMAL LOW (ref 3.87–5.11)
RDW: 13.4 % (ref 11.5–15.5)
WBC: 9 10*3/uL (ref 4.0–10.5)

## 2017-08-15 LAB — ECHOCARDIOGRAM COMPLETE
Height: 63 in
Weight: 4427.2 oz

## 2017-08-15 LAB — HEPARIN LEVEL (UNFRACTIONATED): Heparin Unfractionated: 0.56 IU/mL (ref 0.30–0.70)

## 2017-08-15 LAB — PLATELET INHIBITION P2Y12: Platelet Function  P2Y12: 268 [PRU] (ref 194–418)

## 2017-08-15 MED ORDER — SENNOSIDES-DOCUSATE SODIUM 8.6-50 MG PO TABS
1.0000 | ORAL_TABLET | Freq: Two times a day (BID) | ORAL | Status: DC
  Administered 2017-08-15 – 2017-08-16 (×3): 1 via ORAL
  Filled 2017-08-15 (×3): qty 1

## 2017-08-15 MED ORDER — IOPAMIDOL (ISOVUE-370) INJECTION 76%
INTRAVENOUS | Status: AC
  Administered 2017-08-15: 80 mL via INTRAVENOUS
  Filled 2017-08-15: qty 100

## 2017-08-15 NOTE — Plan of Care (Signed)
  Progressing Clinical Measurements: Ability to maintain clinical measurements within normal limits will improve 08/15/2017 0936 - Progressing by Harlin Heys, RN Will remain free from infection 08/15/2017 0936 - Progressing by Harlin Heys, RN Diagnostic test results will improve 08/15/2017 0936 - Progressing by Harlin Heys, RN Respiratory complications will improve 08/15/2017 0936 - Progressing by Harlin Heys, RN Cardiovascular complication will be avoided 08/15/2017 0936 - Progressing by Harlin Heys, RN Coping: Level of anxiety will decrease 08/15/2017 0936 - Progressing by Harlin Heys, RN Elimination: Will not experience complications related to bowel motility 08/15/2017 0936 - Progressing by Harlin Heys, RN Will not experience complications related to urinary retention 08/15/2017 0936 - Progressing by Harlin Heys, RN Pain Managment: General experience of comfort will improve 08/15/2017 0936 - Progressing by Harlin Heys, RN Safety: Ability to remain free from injury will improve 08/15/2017 0936 - Progressing by Harlin Heys, RN Skin Integrity: Risk for impaired skin integrity will decrease 08/15/2017 0936 - Progressing by Harlin Heys, RN Spiritual Needs Ability to function at adequate level 08/15/2017 0936 - Progressing by Harlin Heys, RN

## 2017-08-15 NOTE — Progress Notes (Signed)
PROGRESS NOTE  [name]:[id number] DOB: 03/27/1951 DOA: 08/09/2017 PCP: [name], MD  HPI/Recap of past 24 hours: Brandi A Farringtonis a 66 y.o.femalewith significant PMH of CAD s/p LAD stent with 2 episodes of re-stenosis, HTN, HLD, T2DM, GERD with esophageal stricture presents with chest pain similar to prior episodes of stent re-stenosis. Negative troponins and unchanged EKG from priors. Cardiology consulted, s/p left cath with coronary angiography 08/11/17. Cardiology recommends CTS evaluation for possible CABG. Holding off coumadin and plavix due to possible surgical intervention on Wednesday 08/17/17.  Currently on heparin drip and will continue. No acute events overnight. PT evaluated on 08/14/16 with no needs so far for PT in the outpatient setting.  This morning, the patient has no complaints. She denies chest pain, palpitations or dyspnea. Also denies weakness or dizziness when she ambulates.  Assessment/Plan: Principal Problem:   Chest pain Active Problems:   Essential hypertension   Paroxysmal atrial fibrillation   Unstable angina (HCC)   CAD s/p LAD DES 2013, 2015 and 2017   Dyslipidemia   Chronic anticoagulation   DM (diabetes mellitus), type 2 with peripheral vascular complications (HCC)   CKD (chronic kidney disease) stage 3, GFR 30-59 ml/min (HCC)   Chest pain 2/2 ACS -ACS ruled in -cardiology following -s/p left cath and coronary angiography 08/11/17 -Single vessel obstructive CAD with severe in stent restenosis in the proximal LAD -Normal LV function. Normal LVEDP -patient has recurrent restenosis in a large proximal LAD which has been stented multiple times in the past. -possible CABG on Wednesday 08/17/17 -continue to hold Plavix and coumadin.  -continue IV heparin. Pharmacy team management appreciated  CAD s/p prox LAD stent with recurrent in-stent restenosis: -hold plavix 75mg  daily and coumadin due to possible surgical  intervention CABG 08/17/17 -continue lisinopril 20mg  daily, metoprolol 25mg  BID, lipitor 40 mg daily  Paroxismal A-fib -sinus rhythm currently -previously on warfarin for anticoagulation; changed to heparin due to possible surgical intervention on wednesday  Subtherapeutic INR -previous on coumadin which is being held. -continue heparin drip for possible CABG   Chronic normocytic anemia -stable -hg 10.8 -cbc am  Hyponatremia -resolved  -Na+ 136 -BMP am on Monday 08/15/17  HTN: -Stable -Continue lisinopril, HCTZ, metoprolol  T2DM: -A1c 7.9 (08/13/17) - insulin SS with hypoglycemia protocol  CKD stage 2 -Stable. -cr 0.98  GERD -stable -protonix -GI cocktail prn   Code Status: Full  Family Communication: No family members at bedside. Has a son who works in the [place] at [employer].  Disposition Plan: Will stay another midnight to continue close monitoring and heparin infusion with anticipation of possible CABG on Wednesday 08/17/17.   Consultants:  Cardiology  Procedures:  Left cardiac cath through right radial access  Antimicrobials:  none  DVT prophylaxis:  heparin drip   Objective: Vitals:   08/14/17 1157 08/14/17 2000 08/14/17 2200 08/15/17 0538  BP: (!) 118/43 (!) 129/33 (!) 145/45 (!) 143/50  Pulse: 61 60  (!) 59  Resp: 20 18  18   Temp: 97.8 F (36.6 C) 98.4 F (36.9 C)  98.5 F (36.9 C)  TempSrc: Oral Oral  Oral  SpO2: 98% 100%  100%  Weight:    125.5 kg (276 lb 11.2 oz)  Height:        Intake/Output Summary (Last 24 hours) at 08/15/2017 0743 Last data filed at 08/15/2017 0539 Gross per 24 hour  Intake 985 ml  Output 1500 ml  Net -515 ml   Filed Weights   08/13/17  0611 08/14/17 0547 08/15/17 0538  Weight: 125.6 kg (276 lb 12.8 oz) 125.4 kg (276 lb 6.4 oz) 125.5 kg (276 lb 11.2 oz)    Exam:   General:  66 yo AAF WD WN in NAD   Cardiovascular: RRR with no rubs or gallops   Respiratory: CTA with no wheezes or  rales  Abdomen: soft obese NT ND with NBS x 4 quadrants  Musculoskeletal: moves all limbs. Some bruising noted in upper arms from venous punctures.  Skin: bruising noted from venous puncture sites and hematoma as stated above  Psychiatry: Mood is appropriate for condition and setting  Data Reviewed: CBC: Recent Labs  Lab 08/11/17 0228 08/12/17 0450 08/13/17 0331 08/14/17 0519 08/15/17 0540  WBC 8.9 8.2 8.2 8.7 9.0  HGB 10.7* 11.0* 10.8* 10.1* 10.8*  HCT 32.6* 32.8* 32.6* 30.7* 32.9*  MCV 85.6 85.0 85.1 85.0 85.9  PLT 217 217 216 207 415   Basic Metabolic Panel: Recent Labs  Lab 08/09/17 1341 08/11/17 0228 08/13/17 0331 08/15/17 0540  NA 133* 133* 136 136  K 4.4 4.1 3.7 4.1  CL 99* 101 104 104  CO2 27 24 25 26   GLUCOSE 256* 219* 202* 188*  BUN 16 19 12 15   CREATININE 1.10* 1.15* 0.98 0.98  CALCIUM 9.6 8.6* 9.0 9.4   GFR: Estimated Creatinine Clearance: 71.8 mL/min (by C-G formula based on SCr of 0.98 mg/dL). Liver Function Tests: Recent Labs  Lab 08/13/17 0331  AST 17  ALT 15  ALKPHOS 74  BILITOT 0.5  PROT 6.6  ALBUMIN 2.9*   No results for input(s): LIPASE, AMYLASE in the last 168 hours. No results for input(s): AMMONIA in the last 168 hours. Coagulation Profile: Recent Labs  Lab 08/11/17 1322 08/12/17 0450 08/13/17 0331 08/14/17 0519 08/15/17 0540  INR 1.81 1.59 1.40 1.22 1.11   Cardiac Enzymes: Recent Labs  Lab 08/10/17 1141 08/10/17 1629 08/10/17 2216  TROPONINI <0.03 <0.03 <0.03   BNP (last 3 results) No results for input(s): PROBNP in the last 8760 hours. HbA1C: Recent Labs    08/13/17 0331  HGBA1C 7.9*   CBG: Recent Labs  Lab 08/14/17 0206 08/14/17 0723 08/14/17 1127 08/14/17 1628 08/14/17 2109  GLUCAP 219* 152* 220* 210* 132*   Lipid Profile: No results for input(s): CHOL, HDL, LDLCALC, TRIG, CHOLHDL, LDLDIRECT in the last 72 hours. Thyroid Function Tests: Recent Labs    08/13/17 0331  TSH 1.744   Anemia  Panel: No results for input(s): VITAMINB12, FOLATE, FERRITIN, TIBC, IRON, RETICCTPCT in the last 72 hours. Urine analysis:    Component Value Date/Time   COLORURINE YELLOW 08/13/2017 [place] 08/13/2017 1744   LABSPEC 1.013 08/13/2017 1744   PHURINE 6.0 08/13/2017 1744   GLUCOSEU 150 (A) 08/13/2017 1744   HGBUR SMALL (A) 08/13/2017 1744   BILIRUBINUR NEGATIVE 08/13/2017 1744   KETONESUR NEGATIVE 08/13/2017 1744   PROTEINUR NEGATIVE 08/13/2017 1744   UROBILINOGEN 0.2 12/21/2013 1129   NITRITE NEGATIVE 08/13/2017 1744   LEUKOCYTESUR NEGATIVE 08/13/2017 1744   Sepsis Labs: @LABRCNTIP (procalcitonin:4,lacticidven:4)  ) Recent Results (from the past 240 hour(s))  Surgical PCR screen     Status: None   Collection Time: 08/13/17  4:08 PM  Result Value Ref Range Status   MRSA, PCR NEGATIVE NEGATIVE Final   Staphylococcus aureus NEGATIVE NEGATIVE Final    Comment: (NOTE) The Xpert SA Assay (FDA approved for NASAL specimens in patients 56 years of age and older), is one component of a comprehensive surveillance program.  It is not intended to diagnose infection nor to guide or monitor treatment.       Studies: No results found.  Scheduled Meds: . aspirin EC  81 mg Oral Daily  . atorvastatin  40 mg Oral q1800  . lisinopril  20 mg Oral Daily   And  . hydrochlorothiazide  25 mg Oral Daily  . insulin aspart  0-15 Units Subcutaneous TID WC  . insulin aspart  0-5 Units Subcutaneous QHS  . insulin glargine  10 Units Subcutaneous QHS  . metoprolol tartrate  25 mg Oral BID  . pantoprazole  40 mg Oral Daily  . sodium chloride flush  3 mL Intravenous Q12H    Continuous Infusions: . sodium chloride    . heparin 1,100 Units/hr (08/14/17 0929)     LOS: 4 days     Kayleen Memos, MD [facility] Pager [phone]  If 7PM-7AM, please contact night-coverage [email or web address] Password [email or web address] 08/15/2017, 7:43 AM

## 2017-08-15 NOTE — Care Management Important Message (Signed)
Important Message  Patient Details  Name: [name] MRN: [id number] Date of Birth: [birth year]   Medicare Important Message Given:  Yes    Hina Gupta [address] 08/15/2017, 12:46 PM

## 2017-08-15 NOTE — Progress Notes (Signed)
[facility] for Heparin Indication: ACS, awaiting CABG  Allergies  Allergen Reactions  . Levofloxacin Itching  . Morphine And Related Itching   Patient Measurements: Height: 5\' 3"  (160 cm) Weight: 276 lb 11.2 oz (125.5 kg) IBW/kg (Calculated) : 52.4 Heparin Dosing Weight: 83.2 kg  Vital Signs: Temp: 98.5 F (36.9 C) (11/19 0538) Temp Source: Oral (11/19 0538) BP: 143/50 (11/19 0538) Pulse Rate: 59 (11/19 0538)  Labs: Recent Labs    08/13/17 0331 08/13/17 1306 08/14/17 0519 08/15/17 0540  HGB 10.8*  --  10.1* 10.8*  HCT 32.6*  --  30.7* 32.9*  PLT 216  --  207 231  LABPROT 17.0*  --  15.3* 14.2  INR 1.40  --  1.22 1.11  HEPARINUNFRC 0.50 0.49 0.36 0.56  CREATININE 0.98  --   --  0.98    Estimated Creatinine Clearance: 71.8 mL/min (by C-G formula based on SCr of 0.98 mg/dL).  Assessment: 66 year old female s/p cardiac cath with plan for possible CABG- IV heparin resumed per pharmacy.  Heparin level remains therapeutic. CBC stable. No bleeding documented.  PTA warfarin and Plavix on hold. INR this AM is 1.11  Goal of Therapy:  Heparin level 0.3-0.7 units/ml Monitor platelets by anticoagulation protocol: Yes   Plan:  Continue heparin gtt at 1100 units/hr Daily heparin level and CBC Monitor for s/s bleeding Possible CABG next week Warfarin and Plavix on hold  Thank you Anette Guarneri, PharmD [phone] 08/15/2017 8:11 AM

## 2017-08-15 NOTE — Progress Notes (Signed)
  Echocardiogram 2D Echocardiogram has been performed.  [name] F 08/15/2017, 4:03 PM

## 2017-08-15 NOTE — Progress Notes (Signed)
Inpatient Diabetes Program Recommendations  AACE/ADA: New Consensus Statement on Inpatient Glycemic Control (2015)  Target Ranges:  Prepandial:   less than 140 mg/dL      Peak postprandial:   less than 180 mg/dL (1-2 hours)      Critically ill patients:  140 - 180 mg/dL  Results for NYLA, CREASON (MRN [id number]) as of 08/15/2017 08:55  Ref. Range 08/14/2017 07:23 08/14/2017 11:27 08/14/2017 16:28 08/14/2017 21:09 08/15/2017 07:57  Glucose-Capillary Latest Ref Range: 65 - 99 mg/dL 152 (H) 220 (H) 210 (H) 132 (H) 177 (H)    Review of Glycemic Control Diabetes history: DM2 Outpatient Diabetes medications: 70/30 42 units QAM, 70/30 20 units QPM, Amaryl 2 mg daily Current orders for Inpatient glycemic control: Lantus 10 units QHS, Novolog 0-15 units TID with meals, Novolog 0-5 units QHS  Inpatient Diabetes Program Recommendations: Insulin - Meal Coverage: Please consider ordering Novolog 3 units TID with meals for meal coverage if patient east at least 50% of meals.  Thanks, Barnie Alderman, RN, MSN, CDE Diabetes Coordinator Inpatient Diabetes Program [phone] (Team Pager from 8am to 5pm)

## 2017-08-15 NOTE — Progress Notes (Signed)
CARDIAC REHAB PHASE I   PRE:  Rate/Rhythm: 67 SR  BP:  Supine:   Sitting: 136/52  Standing:    SaO2: 96%RA  MODE:  Ambulation: 200 ft   POST:  Rate/Rhythm: 106 ST  BP:  Supine: 156/52  Sitting:   Standing:    SaO2: 99%RA 1428-1510 Got pt to rock to stand and needed some assistance. Once up, pt able to walk 200 ft with rolling walker and asst x 2. Back to bed for ECHO. No CP.    Graylon Good, RN BSN  08/15/2017 3:05 PM

## 2017-08-16 ENCOUNTER — Encounter (HOSPITAL_COMMUNITY): Payer: Self-pay | Admitting: Certified Registered Nurse Anesthetist

## 2017-08-16 ENCOUNTER — Inpatient Hospital Stay (HOSPITAL_COMMUNITY): Payer: Medicare HMO

## 2017-08-16 DIAGNOSIS — Z01810 Encounter for preprocedural cardiovascular examination: Secondary | ICD-10-CM

## 2017-08-16 DIAGNOSIS — I208 Other forms of angina pectoris: Secondary | ICD-10-CM

## 2017-08-16 LAB — BASIC METABOLIC PANEL
Anion gap: 7 (ref 5–15)
BUN: 17 mg/dL (ref 6–20)
CO2: 24 mmol/L (ref 22–32)
Calcium: 9.1 mg/dL (ref 8.9–10.3)
Chloride: 103 mmol/L (ref 101–111)
Creatinine, Ser: 1.03 mg/dL — ABNORMAL HIGH (ref 0.44–1.00)
GFR calc Af Amer: >60 mL/min (ref >60)
GFR calc non Af Amer: 55 mL/min — ABNORMAL LOW (ref >60)
Glucose, Bld: 199 mg/dL — ABNORMAL HIGH (ref 65–99)
POTASSIUM: 4 mmol/L (ref 3.5–5.1)
SODIUM: 134 mmol/L — AB (ref 135–145)

## 2017-08-16 LAB — CBC
HEMATOCRIT: 31 % — AB (ref 36.0–46.0)
HEMOGLOBIN: 10 g/dL — AB (ref 12.0–15.0)
MCH: 27.6 pg (ref 26.0–34.0)
MCHC: 32.3 g/dL (ref 30.0–36.0)
MCV: 85.6 fL (ref 78.0–100.0)
Platelets: 210 10*3/uL (ref 150–400)
RBC: 3.62 MIL/uL — ABNORMAL LOW (ref 3.87–5.11)
RDW: 13.1 % (ref 11.5–15.5)
WBC: 9.2 10*3/uL (ref 4.0–10.5)

## 2017-08-16 LAB — GLUCOSE, CAPILLARY
GLUCOSE-CAPILLARY: 176 mg/dL — AB (ref 65–99)
GLUCOSE-CAPILLARY: 163 mg/dL — AB (ref 65–99)
Glucose-Capillary: 193 mg/dL — ABNORMAL HIGH (ref 65–99)
Glucose-Capillary: 247 mg/dL — ABNORMAL HIGH (ref 65–99)

## 2017-08-16 LAB — PROTIME-INR
INR: 1.11
Prothrombin Time: 14.2 seconds (ref 11.4–15.2)

## 2017-08-16 LAB — HEPARIN LEVEL (UNFRACTIONATED): HEPARIN UNFRACTIONATED: 0.44 [IU]/mL (ref 0.30–0.70)

## 2017-08-16 MED ORDER — PERFLUTREN LIPID MICROSPHERE
INTRAVENOUS | Status: AC
  Filled 2017-08-16: qty 10

## 2017-08-16 MED ORDER — SODIUM CHLORIDE 0.9 % IV SOLN
INTRAVENOUS | Status: DC
  Administered 2017-08-16 (×2): via INTRAVENOUS

## 2017-08-16 MED ORDER — DIAZEPAM 5 MG PO TABS
5.0000 mg | ORAL_TABLET | Freq: Once | ORAL | Status: AC
  Administered 2017-08-17: 5 mg via ORAL
  Filled 2017-08-16: qty 1

## 2017-08-16 MED ORDER — EPINEPHRINE PF 1 MG/ML IJ SOLN
0.0000 ug/min | INTRAVENOUS | Status: DC
  Filled 2017-08-16: qty 4

## 2017-08-16 MED ORDER — CHLORHEXIDINE GLUCONATE CLOTH 2 % EX PADS
6.0000 | MEDICATED_PAD | Freq: Once | CUTANEOUS | Status: AC
  Administered 2017-08-17: 6 via TOPICAL

## 2017-08-16 MED ORDER — DEXMEDETOMIDINE HCL IN NACL 400 MCG/100ML IV SOLN
0.1000 ug/kg/h | INTRAVENOUS | Status: DC
  Filled 2017-08-16: qty 100

## 2017-08-16 MED ORDER — TRANEXAMIC ACID (OHS) BOLUS VIA INFUSION
15.0000 mg/kg | INTRAVENOUS | Status: AC
  Administered 2017-08-17: 1881 mg via INTRAVENOUS
  Filled 2017-08-16: qty 1881

## 2017-08-16 MED ORDER — TRANEXAMIC ACID 1000 MG/10ML IV SOLN
1.5000 mg/kg/h | INTRAVENOUS | Status: DC
  Filled 2017-08-16: qty 25

## 2017-08-16 MED ORDER — NITROGLYCERIN IN D5W 200-5 MCG/ML-% IV SOLN
2.0000 ug/min | INTRAVENOUS | Status: AC
  Administered 2017-08-17: 16.6 ug/min via INTRAVENOUS
  Filled 2017-08-16: qty 250

## 2017-08-16 MED ORDER — DOPAMINE-DEXTROSE 3.2-5 MG/ML-% IV SOLN
0.0000 ug/kg/min | INTRAVENOUS | Status: DC
  Filled 2017-08-16: qty 250

## 2017-08-16 MED ORDER — CHLORHEXIDINE GLUCONATE CLOTH 2 % EX PADS
6.0000 | MEDICATED_PAD | Freq: Once | CUTANEOUS | Status: AC
  Administered 2017-08-16: 6 via TOPICAL

## 2017-08-16 MED ORDER — MAGNESIUM SULFATE 50 % IJ SOLN
40.0000 meq | INTRAMUSCULAR | Status: DC
  Filled 2017-08-16: qty 9.85

## 2017-08-16 MED ORDER — TEMAZEPAM 15 MG PO CAPS: 15.0000 mg | ORAL_CAPSULE | Freq: Once | ORAL | Status: DC | PRN

## 2017-08-16 MED ORDER — CHLORHEXIDINE GLUCONATE 0.12 % MT SOLN
15.0000 mL | Freq: Once | OROMUCOSAL | Status: AC
  Administered 2017-08-17: 15 mL via OROMUCOSAL
  Filled 2017-08-16: qty 15

## 2017-08-16 MED ORDER — DEXTROSE 5 % IV SOLN
1.5000 g | INTRAVENOUS | Status: AC
  Administered 2017-08-17: 1.5 g via INTRAVENOUS
  Filled 2017-08-16: qty 1.5

## 2017-08-16 MED ORDER — HEPARIN SODIUM (PORCINE) 1000 UNIT/ML IJ SOLN
INTRAMUSCULAR | Status: DC
  Filled 2017-08-16: qty 30

## 2017-08-16 MED ORDER — PLASMA-LYTE 148 IV SOLN
INTRAVENOUS | Status: DC
  Filled 2017-08-16: qty 2.5

## 2017-08-16 MED ORDER — SODIUM CHLORIDE 0.9 % IV SOLN
INTRAVENOUS | Status: DC
  Filled 2017-08-16: qty 1

## 2017-08-16 MED ORDER — TRANEXAMIC ACID (OHS) PUMP PRIME SOLUTION
2.0000 mg/kg | INTRAVENOUS | Status: DC
  Filled 2017-08-16: qty 2.51

## 2017-08-16 MED ORDER — VANCOMYCIN HCL 10 G IV SOLR
1250.0000 mg | INTRAVENOUS | Status: AC
  Administered 2017-08-17: 1250 mg via INTRAVENOUS
  Filled 2017-08-16: qty 1250

## 2017-08-16 MED ORDER — BISACODYL 5 MG PO TBEC
5.0000 mg | DELAYED_RELEASE_TABLET | Freq: Once | ORAL | Status: AC
  Administered 2017-08-16: 5 mg via ORAL
  Filled 2017-08-16: qty 1

## 2017-08-16 MED ORDER — DEXTROSE 5 % IV SOLN
750.0000 mg | INTRAVENOUS | Status: DC
  Filled 2017-08-16: qty 750

## 2017-08-16 MED ORDER — METOPROLOL TARTRATE 12.5 MG HALF TABLET
12.5000 mg | ORAL_TABLET | Freq: Once | ORAL | Status: AC
  Administered 2017-08-17: 12.5 mg via ORAL
  Filled 2017-08-16: qty 1

## 2017-08-16 MED ORDER — PHENYLEPHRINE HCL 10 MG/ML IJ SOLN
30.0000 ug/min | INTRAMUSCULAR | Status: DC
  Filled 2017-08-16: qty 2

## 2017-08-16 MED ORDER — POTASSIUM CHLORIDE 2 MEQ/ML IV SOLN
80.0000 meq | INTRAVENOUS | Status: DC
Start: 2017-08-17 — End: 2017-08-17
  Filled 2017-08-16: qty 40

## 2017-08-16 NOTE — Progress Notes (Signed)
5 Days Post-Op Procedure(s) (LRB): LEFT HEART CATH AND CORONARY ANGIOGRAPHY (N/A) Subjective: No complaints  Objective: Vital signs in last 24 hours: Temp:  [98 F (36.7 C)-98.1 F (36.7 C)] 98 F (36.7 C) (11/20 0530) Pulse Rate:  [57-69] 57 (11/20 0530) Cardiac Rhythm: Normal sinus rhythm;Bundle branch block (11/20 0701) Resp:  [18] 18 (11/20 0530) BP: (130-148)/(61-68) 143/64 (11/20 0530) SpO2:  [98 %-100 %] 100 % (11/20 0530) Weight:  [276 lb 8 oz (125.4 kg)] 276 lb 8 oz (125.4 kg) (11/20 0530)  Hemodynamic parameters for last 24 hours:    Intake/Output from previous day: 11/19 0701 - 11/20 0700 In: 360 [P.O.:360] Out: 1050 [Urine:1050] Intake/Output this shift: No intake/output data recorded.  General appearance: morbidly obese Neurologic: intact Heart: regular rate and rhythm Lungs: diminished breath sounds bibasilar Abdomen: normal findings: soft, non-tender  Lab Results: Recent Labs    08/15/17 0540 08/16/17 0517  WBC 9.0 9.2  HGB 10.8* 10.0*  HCT 32.9* 31.0*  PLT 231 210   BMET:  Recent Labs    08/15/17 0540 08/16/17 0517  NA 136 134*  K 4.1 4.0  CL 104 103  CO2 26 24  GLUCOSE 188* 199*  BUN 15 17  CREATININE 0.98 1.03*  CALCIUM 9.4 9.1    PT/INR:  Recent Labs    08/16/17 0517  LABPROT 14.2  INR 1.11   ABG    Component Value Date/Time   PHART 7.415 08/13/2017 0514   HCO3 25.8 08/13/2017 0514   TCO2 28 04/07/2012 0005   ACIDBASEDEF 2.0 02/12/2009 0421   O2SAT 92.1 08/13/2017 0514   CBG (last 3)  Recent Labs    08/15/17 1709 08/15/17 2033 08/16/17 0801  GLUCAP 213* 213* 193*    Assessment/Plan: S/P Procedure(s) (LRB): LEFT HEART CATH AND CORONARY ANGIOGRAPHY (N/A)  I reviewed Mrs. Rashid's chart and cath images and obtained history and exam 66 yo woman with known CAD who has had 3 prior interventions on LAD most recently in 2017. Now has recurrent angina with a restenosis. No hemodynamically significant stenosis in RCA  or circumflex. Hx also significant for morbid obesity, sleep apnea, hx of lung cancer resected in 2010, hypertension, hypercholesterolemia, paroxysmal atrial fib and type II DM.  She needs a LIMA to LAD for relief of angina.  I discussed the general nature of the procedure, the need for general anesthesia, and the incisions to be used with Mrs. Magdalen Spatz. We will attempt to do off pump but given body habitus will have a low threshold to go on bypass. We discussed the expected hospital stay, overall recovery and short and long term outcomes. I informed her of the indications, risks, benefits and alternatives. She understands the risks include but are not limited to death, stroke, MI, DVT/PE, bleeding, possible need for transfusion, infections, as well as other organ system dysfunction including respiratory, renal, or GI complications.   She accepts the risks and agrees to proceed.   For CABG in AM    LOS: 5 days    Melrose Nakayama 08/16/2017

## 2017-08-16 NOTE — Progress Notes (Signed)
[place] for Heparin Indication: ACS, awaiting CABG  Allergies  Allergen Reactions  . Levofloxacin Itching  . Morphine And Related Itching   Patient Measurements: Height: 5\' 3"  (160 cm) Weight: 276 lb 8 oz (125.4 kg) IBW/kg (Calculated) : 52.4 Heparin Dosing Weight: 83.2 kg  Vital Signs: Temp: 98 F (36.7 C) (11/20 0530) Temp Source: Oral (11/20 0530) BP: 143/64 (11/20 0530) Pulse Rate: 57 (11/20 0530)  Labs: Recent Labs    08/14/17 0519 08/15/17 0540 08/16/17 0517  HGB 10.1* 10.8* 10.0*  HCT 30.7* 32.9* 31.0*  PLT 207 231 210  LABPROT 15.3* 14.2 14.2  INR 1.22 1.11 1.11  HEPARINUNFRC 0.36 0.56 0.44  CREATININE  --  0.98 1.03*    Estimated Creatinine Clearance: 68.3 mL/min (A) (by C-G formula based on SCr of 1.03 mg/dL (H)).  Assessment: 66 year old female s/p cardiac cath with plan for possible CABG- IV heparin resumed per pharmacy.  Heparin level remains therapeutic. CBC stable. No bleeding documented.  PTA warfarin and Plavix on hold. INR this AM is 1.11  Goal of Therapy:  Heparin level 0.3-0.7 units/ml Monitor platelets by anticoagulation protocol: Yes   Plan:  Continue heparin gtt at 1100 units/hr Daily heparin level and CBC Monitor for s/s bleeding Possible CABG Wednesday Warfarin and Plavix on hold  Thank you Anette Guarneri, PharmD [phone] 08/16/2017 8:48 AM

## 2017-08-16 NOTE — Progress Notes (Signed)
Inpatient Diabetes Program Recommendations  AACE/ADA: New Consensus Statement on Inpatient Glycemic Control (2015)  Target Ranges:  Prepandial:   less than 140 mg/dL      Peak postprandial:   less than 180 mg/dL (1-2 hours)      Critically ill patients:  140 - 180 mg/dL   Results for [name], [name] (MRN [id number]) as of 08/16/2017 07:46  Ref. Range 08/16/2017 05:17  Glucose Latest Ref Range: 65 - 99 mg/dL 199 (H)   Results for [name], [name] (MRN [id number]) as of 08/16/2017 07:46  Ref. Range 08/15/2017 07:57 08/15/2017 11:40 08/15/2017 17:09 08/15/2017 20:33  Glucose-Capillary Latest Ref Range: 65 - 99 mg/dL 177 (H) 229 (H) 213 (H) 213 (H)    Review of Glycemic Control  Diabetes history:DM2 Outpatient Diabetes medications:70/30 42 units QAM, 70/30 20 units QPM, Amaryl 2 mg daily Current orders for Inpatient glycemic control:Lantus 10 units QHS, Novolog 0-15 units TID with meals, Novolog 0-5 units QHS  Inpatient Diabetes Program Recommendations: Insulin - Meal Coverage: Post prandial glucose is consistently elevated. Please consider ordering Novolog 3 units TID with meals for meal coverage if patient east at least 50% of meals  Thanks, Barnie Alderman, RN, MSN, CDE Diabetes Coordinator Inpatient Diabetes Program [phone] (Team Pager from 8am to 5pm)

## 2017-08-16 NOTE — Progress Notes (Signed)
Physical Therapy Treatment Patient Details Name: [name] MRN: [id number] DOB: Feb 10, 1951 Today's Date: 08/16/2017    History of Present Illness Pt is a 66 y.o. female with medical history significant of IDDM; OSA not on CPAP; CAD; PVD; afib on Coumadin; remote lung cancer; HTN; HLD; and s/p L TKA. She was admitted with chest pain. Pt is tentatively scheduled to undergo CABG 08-17-17.      PT Comments    Pt sitting with nursing upon arrival. Pt motivated for therapy. Educated pt on upcoming sternal precautions after scheduled surgery. Pt verbalized understanding. Pt able to perform proper sit to stand from elevated surface using momentum to help with power up. VC for erect posture during gait. Pt reported increased pain in leg at end of session. Assisted pt with pericare after using restroom ~67min. Pt will continue to benefit to PT to help increase strength and independence with mobility.  Follow Up Recommendations  Other (comment)(TBD following CABG)     Equipment Recommendations  None recommended by PT    Recommendations for Other Services       Precautions / Restrictions Precautions Precautions: None Restrictions Weight Bearing Restrictions: No    Mobility  Bed Mobility Overal bed mobility: Needs Assistance Bed Mobility: Sit to Sidelying;Sidelying to Sit;Rolling Rolling: Min assist(vc for proper technique to follow upcoming sternal precautions) Sidelying to sit: Mod assist     Sit to sidelying: Mod assist General bed mobility comments: mod A to get BLE onto bed and neutral positioning once in bed. Min A for rolling supine to sidelying. Mod A for sidelying to sitting without using BUE VC for proper technique  Transfers Overall transfer level: Needs assistance Equipment used: Rolling walker (2 wheeled) Transfers: Sit to/from Stand Sit to Stand: Min guard         General transfer comment: min guard A for safety from elevated surface. Pt relies on momentum to  power up into standing. Mod A for sit to stand from lower surface VC to try to stand without BUE. Pt unable to stand from [facility] without using BUE min guard for safety  Ambulation/Gait Ambulation/Gait assistance: Min guard Ambulation Distance (Feet): 320 Feet Assistive device: Rolling walker (2 wheeled) Gait Pattern/deviations: Step-through pattern;Antalgic;Trunk flexed;Wide base of support Gait velocity: decreased   General Gait Details: Vc for upright posture and to avoid heavy WB through BUE when using RW.   Stairs            Wheelchair Mobility    Modified Rankin (Stroke Patients Only)       Balance Overall balance assessment: Needs assistance Sitting-balance support: No upper extremity supported;Feet supported Sitting balance-Leahy Scale: Good Sitting balance - Comments: pt able to sit EOB without UE support   Standing balance support: Bilateral upper extremity supported;During functional activity Standing balance-Leahy Scale: Fair Standing balance comment: RW needed for ambulation.                            Cognition Arousal/Alertness: Awake/alert Behavior During Therapy: WFL for tasks assessed/performed Overall Cognitive Status: Within Functional Limits for tasks assessed                                        Exercises      General Comments        Pertinent Vitals/Pain Pain Assessment: 0-10 Pain Score: 10-Worst pain ever Pain  Location: L knee Pain Descriptors / Indicators: Sore Pain Intervention(s): Monitored during session;Repositioned    Home Living                      Prior Function            PT Goals (current goals can now be found in the care plan section) Acute Rehab PT Goals Patient Stated Goal: not discussed    Frequency    Min 3X/week      PT Plan Current plan remains appropriate    Co-evaluation              AM-PAC PT "6 Clicks" Daily Activity  Outcome Measure  Difficulty  turning over in bed (including adjusting bedclothes, sheets and blankets)?: Unable Difficulty moving from lying on back to sitting on the side of the bed? : Unable Difficulty sitting down on and standing up from a chair with arms (e.g., wheelchair, bedside commode, etc,.)?: Unable Help needed moving to and from a bed to chair (including a wheelchair)?: A Little Help needed walking in hospital room?: A Little Help needed climbing 3-5 steps with a railing? : A Lot 6 Click Score: 11    End of Session Equipment Utilized During Treatment: Gait belt Activity Tolerance: Patient tolerated treatment well Patient left: in chair;with call bell/phone within reach Nurse Communication: Mobility status PT Visit Diagnosis: Difficulty in walking, not elsewhere classified (R26.2);Pain Pain - part of body: Knee     Time: 4540-9811 PT Time Calculation (min) (ACUTE ONLY): 38 min  Charges:   $Gait Training   $Therapeutic Activity (23-16min)                   G Codes:  Functional Assessment Tool Used: AM-PAC 6 Clicks Basic Mobility    Fransisca Connors, SPTA    Fransisca Connors 08/16/2017, 11:35 AM

## 2017-08-16 NOTE — Progress Notes (Signed)
Pre-op Cardiac Surgery  Carotid Findings: Bilateral 1-39% ICA stenosis, antegrade vertebral flow.   Upper Extremity Right Left  Brachial Pressures 163, tri 164, Tri  Radial Waveforms tri tri  Ulnar Waveforms tri tri  Palmar Arch (Allen's Test) Waveform decreases greater than 50% with radial and ulnar compression. Waveform increases greater than 50% with radial compression and decreases greater than 50% with ulnar compression.    Lower  Extremity Right Left  Dorsalis Pedis 145, mono 255, mono  Posterior Tibial 255, mono 255, mono  Ankle/Brachial Indices 0.88 noncompressible    Lita Mains- RDMS, RVT 1:16 PM  08/16/2017

## 2017-08-16 NOTE — Anesthesia Preprocedure Evaluation (Addendum)
Anesthesia Evaluation  Patient identified by MRN, date of birth, ID band Patient awake    Reviewed: Allergy & Precautions, NPO status , Patient's Chart, lab work & pertinent test results  Airway Mallampati: II  TM Distance: >3 FB Neck ROM: Full    Dental  (+) Dental Advisory Given   Pulmonary sleep apnea , former smoker,    breath sounds clear to auscultation       Cardiovascular hypertension, Pt. on medications and Pt. on home beta blockers + angina + CAD, + Past MI, + Cardiac Stents and + Peripheral Vascular Disease  + dysrhythmias Atrial Fibrillation  Rhythm:Regular Rate:Normal     Neuro/Psych negative neurological ROS     GI/Hepatic Neg liver ROS, GERD  ,  Endo/Other  diabetes, Type 2, Insulin DependentMorbid obesity  Renal/GU Renal disease     Musculoskeletal  (+) Arthritis ,   Abdominal   Peds  Hematology  (+) anemia ,   Anesthesia Other Findings   Reproductive/Obstetrics                            Lab Results  Component Value Date   WBC 9.2 08/16/2017   HGB 10.0 (L) 08/16/2017   HCT 31.0 (L) 08/16/2017   MCV 85.6 08/16/2017   PLT 210 08/16/2017   Lab Results  Component Value Date   CREATININE 1.03 (H) 08/16/2017   BUN 17 08/16/2017   NA 134 (L) 08/16/2017   K 4.0 08/16/2017   CL 103 08/16/2017   CO2 24 08/16/2017    Anesthesia Physical Anesthesia Plan  ASA: IV  Anesthesia Plan: General   Post-op Pain Management:    Induction: Intravenous  PONV Risk Score and Plan: 3 and Dexamethasone, Ondansetron and Treatment may vary due to age or medical condition  Airway Management Planned: Oral ETT  Additional Equipment: Arterial line, CVP, PA Cath, TEE and Ultrasound Guidance Line Placement  Intra-op Plan:   Post-operative Plan: Post-operative intubation/ventilation  Informed Consent: I have reviewed the patients History and Physical, chart, labs and discussed the  procedure including the risks, benefits and alternatives for the proposed anesthesia with the patient or authorized representative who has indicated his/her understanding and acceptance.   Dental advisory given  Plan Discussed with: CRNA  Anesthesia Plan Comments:        Anesthesia Quick Evaluation

## 2017-08-16 NOTE — Progress Notes (Addendum)
Pt slept well during the night, Vitals stable, no any sign of SOB and distress noted, no any complain of pain, IV heparin continue @ 11 cc/hr, will continue to monitor the patient.

## 2017-08-16 NOTE — Progress Notes (Signed)
PROGRESS NOTE  [name] NTI:[id number] DOB: December 07, 1950 DOA: 08/09/2017 PCP: [name], MD  HPI/Recap of past 24 hours: Brandi A Farringtonis a 66 y.o.femalewith significant PMH of CAD s/p LAD stent with 2 episodes of re-stenosis, HTN, HLD, T2DM, GERD with esophageal stricture presents with chest pain similar to prior episodes of stent re-stenosis. Negative troponins and unchanged EKG from priors. Cardiology consulted, s/p left cath with coronary angiography 08/11/17. Cardiology recommends CTS evaluation for possible CABG. Holding off coumadin and plavix due to possible surgical intervention on Wednesday 08/17/17.  Currently on heparin drip and will continue. No acute events overnight. PT evaluated on 08/14/16 with no needs so far for PT in the outpatient setting.  Pt seen and examined at her bedside. Denies chest pain, dyspnea or palpitations. Admits to lower extremity edema bilaterally which is chronic. Will attempt to keep her legs elevated. On heparin drip.   Assessment/Plan: Principal Problem:   Chest pain Active Problems:   Essential hypertension   Paroxysmal atrial fibrillation   Unstable angina (HCC)   CAD s/p LAD DES 2013, 2015 and 2017   Dyslipidemia   Chronic anticoagulation   DM (diabetes mellitus), type 2 with peripheral vascular complications (HCC)   CKD (chronic kidney disease) stage 3, GFR 30-59 ml/min (HCC)   Chest pain 2/2 ACS -ACS ruled in -cardiology following -s/p left cath and coronary angiography 08/11/17 -Single vessel obstructive CAD with severe in stent restenosis in the proximal LAD -Normal LV function. Normal LVEDP -patient has recurrent restenosis in a large proximal LAD which has been stented multiple times in the past. -possible CABG on Wednesday 08/17/17 -continue to hold Plavix and coumadin.  -continue IV heparin. Pharmacy team management appreciated  CAD s/p prox LAD stent with recurrent in-stent restenosis: -hold plavix  75mg  daily and coumadin due to possible surgical intervention CABG 08/17/17 -continue lisinopril 20mg  daily, metoprolol 25mg  BID, lipitor 40 mg daily  Paroxismal A-fib -sinus rhythm currently -previously on warfarin for anticoagulation; changed to heparin due to possible surgical intervention on wednesday  Subtherapeutic INR -previous on coumadin which is being held. -continue heparin drip for possible CABG   Chronic normocytic anemia -stable -hg 10.8 -cbc am  Hyponatremia -resolved  -Na+ 136 -BMP am on Monday 08/15/17  HTN: -Stable -Continue lisinopril, HCTZ, metoprolol  T2DM: -A1c 7.9 (08/13/17) - insulin SS with hypoglycemia protocol  CKD stage 2 -Stable. -cr 0.98  GERD -stable -protonix -GI cocktail prn   Code Status: Full  Family Communication: No family members at bedside. Has a son who works in the [place] at [facility].  Disposition Plan: Will stay another midnight to continue close monitoring and heparin infusion with anticipation of possible CABG on Wednesday 08/17/17.   Consultants:  Cardiology  Procedures:  Left cardiac cath through right radial access  Antimicrobials:  none  DVT prophylaxis:  heparin drip   Objective: Vitals:   08/15/17 0538 08/15/17 1223 08/15/17 2056 08/16/17 0530  BP: (!) 143/50 (!) 148/68 130/61 (!) 143/64  Pulse: (!) 59 60 69 (!) 57  Resp: 18 18 18 18   Temp: 98.5 F (36.9 C) 98 F (36.7 C) 98.1 F (36.7 C) 98 F (36.7 C)  TempSrc: Oral Oral Oral Oral  SpO2: 100% 98% 100% 100%  Weight: 125.5 kg (276 lb 11.2 oz)   125.4 kg (276 lb 8 oz)  Height:        Intake/Output Summary (Last 24 hours) at 08/16/2017 0723 Last data filed at 08/16/2017 0543 Gross per 24 hour  Intake 360 ml  Output 1050 ml  Net -690 ml   Filed Weights   08/14/17 0547 08/15/17 0538 08/16/17 0530  Weight: 125.4 kg (276 lb 6.4 oz) 125.5 kg (276 lb 11.2 oz) 125.4 kg (276 lb 8 oz)    Exam:   General:  67 yo AAF WD WN in  NAD   Cardiovascular: RRR with no rubs or gallops   Respiratory: CTA with no wheezes or rales  Abdomen: soft obese NT ND with NBS x 4 quadrants  Musculoskeletal: moves all limbs. Some bruising noted in upper arms from venous punctures. 1+ pitting edema LE bilaterally.  Skin: bruising noted from venous puncture sites and hematoma as stated above  Psychiatry: Mood is appropriate for condition and setting  Data Reviewed: CBC: Recent Labs  Lab 08/12/17 0450 08/13/17 0331 08/14/17 0519 08/15/17 0540 08/16/17 0517  WBC 8.2 8.2 8.7 9.0 9.2  HGB 11.0* 10.8* 10.1* 10.8* 10.0*  HCT 32.8* 32.6* 30.7* 32.9* 31.0*  MCV 85.0 85.1 85.0 85.9 85.6  PLT 217 216 207 231 308   Basic Metabolic Panel: Recent Labs  Lab 08/09/17 1341 08/11/17 0228 08/13/17 0331 08/15/17 0540 08/16/17 0517  NA 133* 133* 136 136 134*  K 4.4 4.1 3.7 4.1 4.0  CL 99* 101 104 104 103  CO2 27 24 25 26 24   GLUCOSE 256* 219* 202* 188* 199*  BUN 16 19 12 15 17   CREATININE 1.10* 1.15* 0.98 0.98 1.03*  CALCIUM 9.6 8.6* 9.0 9.4 9.1   GFR: Estimated Creatinine Clearance: 68.3 mL/min (A) (by C-G formula based on SCr of 1.03 mg/dL (H)). Liver Function Tests: Recent Labs  Lab 08/13/17 0331  AST 17  ALT 15  ALKPHOS 74  BILITOT 0.5  PROT 6.6  ALBUMIN 2.9*   No results for input(s): LIPASE, AMYLASE in the last 168 hours. No results for input(s): AMMONIA in the last 168 hours. Coagulation Profile: Recent Labs  Lab 08/12/17 0450 08/13/17 0331 08/14/17 0519 08/15/17 0540 08/16/17 0517  INR 1.59 1.40 1.22 1.11 1.11   Cardiac Enzymes: Recent Labs  Lab 08/10/17 1141 08/10/17 1629 08/10/17 2216  TROPONINI <0.03 <0.03 <0.03   BNP (last 3 results) No results for input(s): PROBNP in the last 8760 hours. HbA1C: No results for input(s): HGBA1C in the last 72 hours. CBG: Recent Labs  Lab 08/14/17 2109 08/15/17 0757 08/15/17 1140 08/15/17 1709 08/15/17 2033  GLUCAP 132* 177* 229* 213* 213*   Lipid  Profile: No results for input(s): CHOL, HDL, LDLCALC, TRIG, CHOLHDL, LDLDIRECT in the last 72 hours. Thyroid Function Tests: No results for input(s): TSH, T4TOTAL, FREET4, T3FREE, THYROIDAB in the last 72 hours. Anemia Panel: No results for input(s): VITAMINB12, FOLATE, FERRITIN, TIBC, IRON, RETICCTPCT in the last 72 hours. Urine analysis:    Component Value Date/Time   COLORURINE YELLOW 08/13/2017 [place] 08/13/2017 1744   LABSPEC 1.013 08/13/2017 1744   PHURINE 6.0 08/13/2017 1744   GLUCOSEU 150 (A) 08/13/2017 1744   HGBUR SMALL (A) 08/13/2017 1744   BILIRUBINUR NEGATIVE 08/13/2017 1744   KETONESUR NEGATIVE 08/13/2017 1744   PROTEINUR NEGATIVE 08/13/2017 1744   UROBILINOGEN 0.2 12/21/2013 1129   NITRITE NEGATIVE 08/13/2017 1744   LEUKOCYTESUR NEGATIVE 08/13/2017 1744   Sepsis Labs: @LABRCNTIP (procalcitonin:4,lacticidven:4)  ) Recent Results (from the past 240 hour(s))  Surgical PCR screen     Status: None   Collection Time: 08/13/17  4:08 PM  Result Value Ref Range Status   MRSA, PCR NEGATIVE NEGATIVE Final  Staphylococcus aureus NEGATIVE NEGATIVE Final    Comment: (NOTE) The Xpert SA Assay (FDA approved for NASAL specimens in patients 33 years of age and older), is one component of a comprehensive surveillance program. It is not intended to diagnose infection nor to guide or monitor treatment.       Studies: Ct Coronary Morph W/cta Cor W/score W/ca W/cm &/or Wo/cm  Result Date: 08/15/2017 EXAM: OVER-READ INTERPRETATION  CT CHEST The following report is an over-read performed by radiologist Dr. Rebekah Chesterfield [facility] [address] [facility] [place] on 08/15/2017. This over-read does not include interpretation of cardiac or coronary anatomy or pathology. The calcium score and cardiac CTA interpretation by the cardiologist is attached. COMPARISON:  Chest CT 09/15/2015. FINDINGS: Aortic atherosclerosis (mild), without evidence of aneurysm or dissection. Status post  left upper lobectomy. Compensatory hyperexpansion of the left lower lobe. Mild linear scarring in the anterior aspect of the left lower lobe. 2 mm right lower lobe pulmonary nodule (axial image 50 of series 13). No suspicious appearing pulmonary nodules or masses. No acute consolidative airspace disease. No pleural effusions. No pneumothorax. Small hiatal hernia. No lymphadenopathy noted in mediastinal are hilar nodal stations. Visualized portions of the upper abdomen are unremarkable. There are no aggressive appearing lytic or blastic lesions noted in the visualized portions of the skeleton. IMPRESSION: 1.  Aortic Atherosclerosis (ICD10-I70.0). 2. Status post left upper lobectomy. 3. 2 mm right lower lobe nodule, highly nonspecific but statistically likely benign. No follow-up needed if patient is low-risk. Non-contrast chest CT can be considered in 12 months if patient is high-risk. This recommendation follows the consensus statement: Guidelines for Management of Incidental Pulmonary Nodules Detected on CT Images: From the Fleischner Society 2017; Radiology 2017; 284:228-243. Electronically Signed   By: Vinnie Langton M.D.   On: 08/15/2017 13:26    Scheduled Meds: . aspirin EC  81 mg Oral Daily  . atorvastatin  40 mg Oral q1800  . lisinopril  20 mg Oral Daily   And  . hydrochlorothiazide  25 mg Oral Daily  . insulin aspart  0-15 Units Subcutaneous TID WC  . insulin aspart  0-5 Units Subcutaneous QHS  . insulin glargine  10 Units Subcutaneous QHS  . metoprolol tartrate  25 mg Oral BID  . pantoprazole  40 mg Oral Daily  . senna-docusate  1 tablet Oral BID  . sodium chloride flush  3 mL Intravenous Q12H    Continuous Infusions: . sodium chloride    . heparin 1,100 Units/hr (08/16/17 0543)     LOS: 5 days     Kayleen Memos, MD [facility] Pager [phone]  If 7PM-7AM, please contact night-coverage [email or web address] Password [email or web address] 08/16/2017, 7:23 AM

## 2017-08-17 ENCOUNTER — Inpatient Hospital Stay (HOSPITAL_COMMUNITY): Payer: Medicare HMO

## 2017-08-17 ENCOUNTER — Inpatient Hospital Stay (HOSPITAL_COMMUNITY): Payer: Medicare HMO | Admitting: Certified Registered Nurse Anesthetist

## 2017-08-17 ENCOUNTER — Inpatient Hospital Stay (HOSPITAL_COMMUNITY)
Admission: EM | Disposition: A | Discharge status: Skilled Nursing Facility | Payer: Self-pay | Source: Home / Self Care | Attending: Thoracic Surgery (Cardiothoracic Vascular Surgery)

## 2017-08-17 DIAGNOSIS — I2510 Atherosclerotic heart disease of native coronary artery without angina pectoris: Secondary | ICD-10-CM

## 2017-08-17 DIAGNOSIS — Z951 Presence of aortocoronary bypass graft: Secondary | ICD-10-CM

## 2017-08-17 HISTORY — PX: TEE WITHOUT CARDIOVERSION: SHX5443

## 2017-08-17 HISTORY — PX: CORONARY ARTERY BYPASS GRAFT: SHX141

## 2017-08-17 LAB — BASIC METABOLIC PANEL
ANION GAP: 3 — AB (ref 5–15)
BUN: 13 mg/dL (ref 6–20)
CHLORIDE: 105 mmol/L (ref 101–111)
CO2: 27 mmol/L (ref 22–32)
Calcium: 9.1 mg/dL (ref 8.9–10.3)
Creatinine, Ser: 0.94 mg/dL (ref 0.44–1.00)
GFR calc non Af Amer: >60 mL/min (ref >60)
GLUCOSE: 263 mg/dL — AB (ref 65–99)
POTASSIUM: 3.8 mmol/L (ref 3.5–5.1)
Sodium: 135 mmol/L (ref 135–145)

## 2017-08-17 LAB — POCT I-STAT, CHEM 8
BUN: 13 mg/dL (ref 6–20)
BUN: 11 mg/dL (ref 6–20)
BUN: 9 mg/dL (ref 6–20)
BUN: 9 mg/dL (ref 6–20)
CALCIUM ION: 1.31 mmol/L (ref 1.15–1.40)
CALCIUM ION: 1.07 mmol/L — AB (ref 1.15–1.40)
CALCIUM ION: 1.3 mmol/L (ref 1.15–1.40)
CALCIUM ION: 1.22 mmol/L (ref 1.15–1.40)
CHLORIDE: 103 mmol/L (ref 101–111)
CHLORIDE: 105 mmol/L (ref 101–111)
CREATININE: 0.6 mg/dL (ref 0.44–1.00)
CREATININE: 0.6 mg/dL (ref 0.44–1.00)
Chloride: 101 mmol/L (ref 101–111)
Chloride: 107 mmol/L (ref 101–111)
Creatinine, Ser: 0.7 mg/dL (ref 0.44–1.00)
Creatinine, Ser: 0.5 mg/dL (ref 0.44–1.00)
GLUCOSE: 143 mg/dL — AB (ref 65–99)
Glucose, Bld: 187 mg/dL — ABNORMAL HIGH (ref 65–99)
Glucose, Bld: 143 mg/dL — ABNORMAL HIGH (ref 65–99)
Glucose, Bld: 167 mg/dL — ABNORMAL HIGH (ref 65–99)
HCT: 30 % — ABNORMAL LOW (ref 36.0–46.0)
HEMATOCRIT: 22 % — AB (ref 36.0–46.0)
HEMATOCRIT: 28 % — AB (ref 36.0–46.0)
HEMATOCRIT: 27 % — AB (ref 36.0–46.0)
HEMOGLOBIN: 7.5 g/dL — AB (ref 12.0–15.0)
HEMOGLOBIN: 9.5 g/dL — AB (ref 12.0–15.0)
Hemoglobin: 10.2 g/dL — ABNORMAL LOW (ref 12.0–15.0)
Hemoglobin: 9.2 g/dL — ABNORMAL LOW (ref 12.0–15.0)
POTASSIUM: 3.9 mmol/L (ref 3.5–5.1)
POTASSIUM: 3.7 mmol/L (ref 3.5–5.1)
Potassium: 3.6 mmol/L (ref 3.5–5.1)
Potassium: 4.6 mmol/L (ref 3.5–5.1)
SODIUM: 139 mmol/L (ref 135–145)
SODIUM: 142 mmol/L (ref 135–145)
Sodium: 140 mmol/L (ref 135–145)
Sodium: 138 mmol/L (ref 135–145)
TCO2: 28 mmol/L (ref 22–32)
TCO2: 27 mmol/L (ref 22–32)
TCO2: 28 mmol/L (ref 22–32)
TCO2: 24 mmol/L (ref 22–32)

## 2017-08-17 LAB — CBC
HCT: 28.3 % — ABNORMAL LOW (ref 36.0–46.0)
HEMATOCRIT: 32.4 % — AB (ref 36.0–46.0)
HEMATOCRIT: 28.2 % — AB (ref 36.0–46.0)
HEMOGLOBIN: 10.6 g/dL — AB (ref 12.0–15.0)
HEMOGLOBIN: 9.5 g/dL — AB (ref 12.0–15.0)
Hemoglobin: 9.4 g/dL — ABNORMAL LOW (ref 12.0–15.0)
MCH: 28.3 pg (ref 26.0–34.0)
MCH: 28.4 pg (ref 26.0–34.0)
MCH: 28.2 pg (ref 26.0–34.0)
MCHC: 32.7 g/dL (ref 30.0–36.0)
MCHC: 33.6 g/dL (ref 30.0–36.0)
MCHC: 33.3 g/dL (ref 30.0–36.0)
MCV: 86.4 fL (ref 78.0–100.0)
MCV: 84.5 fL (ref 78.0–100.0)
MCV: 84.7 fL (ref 78.0–100.0)
PLATELETS: 203 10*3/uL (ref 150–400)
PLATELETS: 164 10*3/uL (ref 150–400)
Platelets: 150 10*3/uL (ref 150–400)
RBC: 3.75 MIL/uL — AB (ref 3.87–5.11)
RBC: 3.35 MIL/uL — ABNORMAL LOW (ref 3.87–5.11)
RBC: 3.33 MIL/uL — ABNORMAL LOW (ref 3.87–5.11)
RDW: 13.6 % (ref 11.5–15.5)
RDW: 13.2 % (ref 11.5–15.5)
RDW: 13.1 % (ref 11.5–15.5)
WBC: 9.1 10*3/uL (ref 4.0–10.5)
WBC: 12.3 10*3/uL — ABNORMAL HIGH (ref 4.0–10.5)
WBC: 11.8 10*3/uL — ABNORMAL HIGH (ref 4.0–10.5)

## 2017-08-17 LAB — POCT I-STAT 3, ART BLOOD GAS (G3+)
ACID-BASE DEFICIT: 2 mmol/L (ref 0.0–2.0)
Acid-Base Excess: 4 mmol/L — ABNORMAL HIGH (ref 0.0–2.0)
Acid-base deficit: 1 mmol/L (ref 0.0–2.0)
Acid-base deficit: 2 mmol/L (ref 0.0–2.0)
BICARBONATE: 24 mmol/L (ref 20.0–28.0)
BICARBONATE: 22.9 mmol/L (ref 20.0–28.0)
BICARBONATE: 23.7 mmol/L (ref 20.0–28.0)
Bicarbonate: 26.9 mmol/L (ref 20.0–28.0)
O2 SAT: 100 %
O2 SAT: 100 %
O2 Saturation: 99 %
O2 Saturation: 100 %
PCO2 ART: 32.9 mmHg (ref 32.0–48.0)
PCO2 ART: 35.6 mmHg (ref 32.0–48.0)
PCO2 ART: 34.8 mmHg (ref 32.0–48.0)
PH ART: 7.52 — AB (ref 7.350–7.450)
PH ART: 7.422 (ref 7.350–7.450)
PO2 ART: 135 mmHg — AB (ref 83.0–108.0)
PO2 ART: 180 mmHg — AB (ref 83.0–108.0)
PO2 ART: 195 mmHg — AB (ref 83.0–108.0)
Patient temperature: 34.5
Patient temperature: 36
Patient temperature: 36
TCO2: 28 mmol/L (ref 22–32)
TCO2: 25 mmol/L (ref 22–32)
TCO2: 24 mmol/L (ref 22–32)
TCO2: 25 mmol/L (ref 22–32)
pCO2 arterial: 41.2 mmHg (ref 32.0–48.0)
pH, Arterial: 7.426 (ref 7.350–7.450)
pH, Arterial: 7.363 (ref 7.350–7.450)
pO2, Arterial: 363 mmHg — ABNORMAL HIGH (ref 83.0–108.0)

## 2017-08-17 LAB — POCT ACTIVATED CLOTTING TIME: ACTIVATED CLOTTING TIME: 252 s

## 2017-08-17 LAB — GLUCOSE, CAPILLARY
GLUCOSE-CAPILLARY: 191 mg/dL — AB (ref 65–99)
GLUCOSE-CAPILLARY: 91 mg/dL (ref 65–99)
GLUCOSE-CAPILLARY: 75 mg/dL (ref 65–99)
Glucose-Capillary: 99 mg/dL (ref 65–99)
Glucose-Capillary: 84 mg/dL (ref 65–99)
Glucose-Capillary: 126 mg/dL — ABNORMAL HIGH (ref 65–99)
Glucose-Capillary: 145 mg/dL — ABNORMAL HIGH (ref 65–99)
Glucose-Capillary: 156 mg/dL — ABNORMAL HIGH (ref 65–99)
Glucose-Capillary: 159 mg/dL — ABNORMAL HIGH (ref 65–99)
Glucose-Capillary: 128 mg/dL — ABNORMAL HIGH (ref 65–99)

## 2017-08-17 LAB — POCT I-STAT 4, (NA,K, GLUC, HGB,HCT)
Glucose, Bld: 170 mg/dL — ABNORMAL HIGH (ref 65–99)
Glucose, Bld: 121 mg/dL — ABNORMAL HIGH (ref 65–99)
HEMATOCRIT: 26 % — AB (ref 36.0–46.0)
HEMATOCRIT: 27 % — AB (ref 36.0–46.0)
HEMOGLOBIN: 8.8 g/dL — AB (ref 12.0–15.0)
HEMOGLOBIN: 9.2 g/dL — AB (ref 12.0–15.0)
Potassium: 3.7 mmol/L (ref 3.5–5.1)
Potassium: 3.8 mmol/L (ref 3.5–5.1)
SODIUM: 141 mmol/L (ref 135–145)
Sodium: 143 mmol/L (ref 135–145)

## 2017-08-17 LAB — HEMOGLOBIN AND HEMATOCRIT, BLOOD
HEMATOCRIT: 23 % — AB (ref 36.0–46.0)
HEMOGLOBIN: 7.9 g/dL — AB (ref 12.0–15.0)

## 2017-08-17 LAB — MAGNESIUM: MAGNESIUM: 2.7 mg/dL — AB (ref 1.7–2.4)

## 2017-08-17 LAB — PROTIME-INR
INR: 1.04
INR: 1.34
PROTHROMBIN TIME: 16.4 s — AB (ref 11.4–15.2)
Prothrombin Time: 13.5 seconds (ref 11.4–15.2)

## 2017-08-17 LAB — CREATININE, SERUM
Creatinine, Ser: 0.8 mg/dL (ref 0.44–1.00)
GFR calc Af Amer: >60 mL/min
GFR calc non Af Amer: >60 mL/min

## 2017-08-17 LAB — PLATELET COUNT: Platelets: 189 10*3/uL (ref 150–400)

## 2017-08-17 LAB — HEPARIN LEVEL (UNFRACTIONATED): Heparin Unfractionated: 0.31 IU/mL (ref 0.30–0.70)

## 2017-08-17 LAB — APTT: aPTT: 33 seconds (ref 24–36)

## 2017-08-17 SURGERY — CORONARY ARTERY BYPASS GRAFTING (CABG)
Anesthesia: General | Site: Chest

## 2017-08-17 MED ORDER — DEXTROSE 5 % IV SOLN
1.5000 g | Freq: Two times a day (BID) | INTRAVENOUS | Status: AC
  Administered 2017-08-17 – 2017-08-19 (×4): 1.5 g via INTRAVENOUS
  Filled 2017-08-17 (×4): qty 1.5

## 2017-08-17 MED ORDER — PLASMA-LYTE 148 IV SOLN
INTRAVENOUS | Status: AC | PRN
  Administered 2017-08-17: 500 mL via INTRAVENOUS

## 2017-08-17 MED ORDER — ROCURONIUM BROMIDE 100 MG/10ML IV SOLN
INTRAVENOUS | Status: DC | PRN
  Administered 2017-08-17: 60 mg via INTRAVENOUS
  Administered 2017-08-17: 40 mg via INTRAVENOUS
  Administered 2017-08-17: 50 mg via INTRAVENOUS

## 2017-08-17 MED ORDER — MIDAZOLAM HCL 10 MG/2ML IJ SOLN
INTRAMUSCULAR | Status: AC
  Filled 2017-08-17: qty 2

## 2017-08-17 MED ORDER — PROPOFOL 10 MG/ML IV BOLUS
INTRAVENOUS | Status: AC
  Filled 2017-08-17: qty 20

## 2017-08-17 MED ORDER — FENTANYL CITRATE (PF) 250 MCG/5ML IJ SOLN
INTRAMUSCULAR | Status: AC
  Filled 2017-08-17: qty 25

## 2017-08-17 MED ORDER — ACETAMINOPHEN 500 MG PO TABS
1000.0000 mg | ORAL_TABLET | Freq: Four times a day (QID) | ORAL | Status: AC
  Administered 2017-08-18 – 2017-08-22 (×15): 1000 mg via ORAL
  Filled 2017-08-17 (×17): qty 2

## 2017-08-17 MED ORDER — PROPOFOL 10 MG/ML IV BOLUS
INTRAVENOUS | Status: DC | PRN
  Administered 2017-08-17: 100 mg via INTRAVENOUS

## 2017-08-17 MED ORDER — VANCOMYCIN HCL IN DEXTROSE 1-5 GM/200ML-% IV SOLN
1000.0000 mg | Freq: Once | INTRAVENOUS | Status: AC
  Administered 2017-08-17: 1000 mg via INTRAVENOUS
  Filled 2017-08-17: qty 200

## 2017-08-17 MED ORDER — 0.9 % SODIUM CHLORIDE (POUR BTL) OPTIME
TOPICAL | Status: DC | PRN
  Administered 2017-08-17: 6000 mL

## 2017-08-17 MED ORDER — PROTAMINE SULFATE 10 MG/ML IV SOLN
INTRAVENOUS | Status: DC | PRN
  Administered 2017-08-17: 350 mg via INTRAVENOUS

## 2017-08-17 MED ORDER — FENTANYL CITRATE (PF) 250 MCG/5ML IJ SOLN
INTRAMUSCULAR | Status: DC | PRN
  Administered 2017-08-17: 400 ug via INTRAVENOUS
  Administered 2017-08-17: 50 ug via INTRAVENOUS
  Administered 2017-08-17: 100 ug via INTRAVENOUS
  Administered 2017-08-17: 150 ug via INTRAVENOUS
  Administered 2017-08-17 (×2): 50 ug via INTRAVENOUS
  Administered 2017-08-17 (×2): 100 ug via INTRAVENOUS
  Administered 2017-08-17: 250 ug via INTRAVENOUS

## 2017-08-17 MED ORDER — ASPIRIN EC 325 MG PO TBEC
325.0000 mg | DELAYED_RELEASE_TABLET | Freq: Every day | ORAL | Status: DC
  Administered 2017-08-18 – 2017-08-19 (×2): 325 mg via ORAL
  Filled 2017-08-17 (×2): qty 1

## 2017-08-17 MED ORDER — SODIUM CHLORIDE 0.9 % IV SOLN
INTRAVENOUS | Status: DC
  Administered 2017-08-17: 13:00:00 via INTRAVENOUS

## 2017-08-17 MED ORDER — CHLORHEXIDINE GLUCONATE 0.12 % MT SOLN
15.0000 mL | OROMUCOSAL | Status: AC
  Administered 2017-08-17: 15 mL via OROMUCOSAL

## 2017-08-17 MED ORDER — LACTATED RINGERS IV SOLN
INTRAVENOUS | Status: DC | PRN
  Administered 2017-08-17 (×2): via INTRAVENOUS

## 2017-08-17 MED ORDER — SODIUM CHLORIDE 0.9% FLUSH: 3.0000 mL | INTRAVENOUS | Status: DC | PRN

## 2017-08-17 MED ORDER — FENTANYL CITRATE (PF) 100 MCG/2ML IJ SOLN
50.0000 ug | INTRAMUSCULAR | Status: DC | PRN
  Administered 2017-08-18 – 2017-08-19 (×3): 50 ug via INTRAVENOUS
  Filled 2017-08-17 (×2): qty 2

## 2017-08-17 MED ORDER — PANTOPRAZOLE SODIUM 40 MG PO TBEC
40.0000 mg | DELAYED_RELEASE_TABLET | Freq: Every day | ORAL | Status: DC
  Administered 2017-08-19 – 2017-08-20 (×2): 40 mg via ORAL
  Filled 2017-08-17 (×3): qty 1

## 2017-08-17 MED ORDER — SODIUM CHLORIDE 0.9 % IV SOLN
0.0000 ug/min | INTRAVENOUS | Status: DC
  Administered 2017-08-17: 5 ug/min via INTRAVENOUS
  Administered 2017-08-18: 30 ug/min via INTRAVENOUS
  Filled 2017-08-17 (×2): qty 2

## 2017-08-17 MED ORDER — TRAMADOL HCL 50 MG PO TABS: 50.0000 mg | ORAL_TABLET | ORAL | Status: DC | PRN

## 2017-08-17 MED ORDER — BISACODYL 5 MG PO TBEC
10.0000 mg | DELAYED_RELEASE_TABLET | Freq: Every day | ORAL | Status: DC
  Administered 2017-08-18 – 2017-08-23 (×6): 10 mg via ORAL
  Filled 2017-08-17 (×6): qty 2

## 2017-08-17 MED ORDER — SODIUM CHLORIDE 0.9 % IV SOLN
INTRAVENOUS | Status: DC | PRN
  Administered 2017-08-17: 0.2 ug/kg/h via INTRAVENOUS

## 2017-08-17 MED ORDER — HEPARIN SODIUM (PORCINE) 1000 UNIT/ML IJ SOLN
INTRAMUSCULAR | Status: DC | PRN
  Administered 2017-08-17: 26 mL via INTRAVENOUS
  Administered 2017-08-17: 12 mL via INTRAVENOUS

## 2017-08-17 MED ORDER — LACTATED RINGERS IV SOLN: INTRAVENOUS | Status: DC

## 2017-08-17 MED ORDER — METOPROLOL TARTRATE 25 MG/10 ML ORAL SUSPENSION: 12.5000 mg | Freq: Two times a day (BID) | ORAL | Status: DC

## 2017-08-17 MED ORDER — LACTATED RINGERS IV SOLN
INTRAVENOUS | Status: DC | PRN
  Administered 2017-08-17: 08:00:00 via INTRAVENOUS

## 2017-08-17 MED ORDER — ACETAMINOPHEN 160 MG/5ML PO SOLN: 650.0000 mg | Freq: Once | ORAL | Status: AC

## 2017-08-17 MED ORDER — INSULIN REGULAR BOLUS VIA INFUSION
0.0000 [IU] | Freq: Three times a day (TID) | INTRAVENOUS | Status: DC
  Filled 2017-08-17: qty 10

## 2017-08-17 MED ORDER — SODIUM CHLORIDE 0.9 % IV SOLN
INTRAVENOUS | Status: DC | PRN
  Administered 2017-08-17: 1.5 mg/kg/h via INTRAVENOUS

## 2017-08-17 MED ORDER — OXYCODONE HCL 5 MG PO TABS
5.0000 mg | ORAL_TABLET | ORAL | Status: DC | PRN
  Administered 2017-08-19 – 2017-08-23 (×7): 10 mg via ORAL
  Filled 2017-08-17 (×7): qty 2

## 2017-08-17 MED ORDER — SODIUM CHLORIDE 0.9% FLUSH
3.0000 mL | Freq: Two times a day (BID) | INTRAVENOUS | Status: DC
  Administered 2017-08-18 – 2017-08-20 (×4): 3 mL via INTRAVENOUS

## 2017-08-17 MED ORDER — ONDANSETRON HCL 4 MG/2ML IJ SOLN
4.0000 mg | Freq: Four times a day (QID) | INTRAMUSCULAR | Status: DC | PRN
  Administered 2017-08-18 – 2017-08-19 (×2): 4 mg via INTRAVENOUS
  Filled 2017-08-17 (×3): qty 2

## 2017-08-17 MED ORDER — MIDAZOLAM HCL 2 MG/2ML IJ SOLN
2.0000 mg | INTRAMUSCULAR | Status: DC | PRN
Start: 2017-08-17 — End: 2017-08-18

## 2017-08-17 MED ORDER — METOCLOPRAMIDE HCL 5 MG/ML IJ SOLN
10.0000 mg | Freq: Four times a day (QID) | INTRAMUSCULAR | Status: AC
  Administered 2017-08-17 – 2017-08-18 (×4): 10 mg via INTRAVENOUS
  Filled 2017-08-17 (×3): qty 2

## 2017-08-17 MED ORDER — METOPROLOL TARTRATE 12.5 MG HALF TABLET
12.5000 mg | ORAL_TABLET | Freq: Two times a day (BID) | ORAL | Status: DC
  Filled 2017-08-17: qty 1

## 2017-08-17 MED ORDER — ASPIRIN 81 MG PO CHEW: 324.0000 mg | CHEWABLE_TABLET | Freq: Every day | ORAL | Status: DC

## 2017-08-17 MED ORDER — ALBUMIN HUMAN 5 % IV SOLN
250.0000 mL | INTRAVENOUS | Status: AC | PRN
  Administered 2017-08-17 – 2017-08-18 (×4): 250 mL via INTRAVENOUS
  Filled 2017-08-17 (×2): qty 250

## 2017-08-17 MED ORDER — LIDOCAINE HCL (CARDIAC) 20 MG/ML IV SOLN
INTRAVENOUS | Status: DC | PRN
Start: 2017-08-17 — End: 2017-08-17
  Administered 2017-08-17: 100 mg via INTRAVENOUS

## 2017-08-17 MED ORDER — MIDAZOLAM HCL 5 MG/5ML IJ SOLN
INTRAMUSCULAR | Status: DC | PRN
  Administered 2017-08-17: 1 mg via INTRAVENOUS
  Administered 2017-08-17: 3 mg via INTRAVENOUS
  Administered 2017-08-17 (×2): 1 mg via INTRAVENOUS

## 2017-08-17 MED ORDER — LACTATED RINGERS IV SOLN: 500.0000 mL | Freq: Once | INTRAVENOUS | Status: DC | PRN

## 2017-08-17 MED ORDER — ACETAMINOPHEN 160 MG/5ML PO SOLN
1000.0000 mg | Freq: Four times a day (QID) | ORAL | Status: DC
  Administered 2017-08-18: 1000 mg
  Filled 2017-08-17: qty 40.6

## 2017-08-17 MED ORDER — PHENYLEPHRINE HCL 10 MG/ML IJ SOLN
INTRAMUSCULAR | Status: DC | PRN
  Administered 2017-08-17: 20 ug/min via INTRAVENOUS

## 2017-08-17 MED ORDER — ACETAMINOPHEN 650 MG RE SUPP
650.0000 mg | Freq: Once | RECTAL | Status: AC
  Administered 2017-08-17: 650 mg via RECTAL

## 2017-08-17 MED ORDER — SODIUM CHLORIDE 0.45 % IV SOLN
INTRAVENOUS | Status: DC | PRN
  Administered 2017-08-17: 13:00:00 via INTRAVENOUS

## 2017-08-17 MED ORDER — PHENYLEPHRINE HCL 10 MG/ML IJ SOLN
INTRAMUSCULAR | Status: DC | PRN
  Administered 2017-08-17: 120 ug via INTRAVENOUS

## 2017-08-17 MED ORDER — TRANEXAMIC ACID 1000 MG/10ML IV SOLN
1.5000 mg/kg/h | INTRAVENOUS | Status: DC
  Filled 2017-08-17: qty 10

## 2017-08-17 MED ORDER — BISACODYL 10 MG RE SUPP: 10.0000 mg | Freq: Every day | RECTAL | Status: DC

## 2017-08-17 MED ORDER — MAGNESIUM SULFATE 4 GM/100ML IV SOLN
4.0000 g | Freq: Once | INTRAVENOUS | Status: AC
  Administered 2017-08-17: 4 g via INTRAVENOUS
  Filled 2017-08-17: qty 100

## 2017-08-17 MED ORDER — SODIUM CHLORIDE 0.9 % IV SOLN
INTRAVENOUS | Status: DC | PRN
  Administered 2017-08-17: 1 [IU]/h via INTRAVENOUS

## 2017-08-17 MED ORDER — METOPROLOL TARTRATE 5 MG/5ML IV SOLN: 2.5000 mg | INTRAVENOUS | Status: DC | PRN

## 2017-08-17 MED ORDER — SODIUM CHLORIDE 0.9 % IJ SOLN
OROMUCOSAL | Status: DC | PRN
  Administered 2017-08-17 (×3): via TOPICAL

## 2017-08-17 MED ORDER — POTASSIUM CHLORIDE 10 MEQ/50ML IV SOLN
10.0000 meq | INTRAVENOUS | Status: AC
  Administered 2017-08-17 (×3): 10 meq via INTRAVENOUS

## 2017-08-17 MED ORDER — SODIUM CHLORIDE 0.9 % IV SOLN
250.0000 mL | INTRAVENOUS | Status: DC
  Administered 2017-08-18: 250 mL via INTRAVENOUS

## 2017-08-17 MED ORDER — DOCUSATE SODIUM 100 MG PO CAPS
200.0000 mg | ORAL_CAPSULE | Freq: Every day | ORAL | Status: DC
  Administered 2017-08-18 – 2017-08-23 (×6): 200 mg via ORAL
  Filled 2017-08-17 (×6): qty 2

## 2017-08-17 MED ORDER — HEMOSTATIC AGENTS (NO CHARGE) OPTIME
TOPICAL | Status: DC | PRN
  Administered 2017-08-17: 1 via TOPICAL

## 2017-08-17 MED ORDER — DEXMEDETOMIDINE HCL 200 MCG/2ML IV SOLN
0.0000 ug/kg/h | INTRAVENOUS | Status: DC
  Administered 2017-08-17: 0.5 ug/kg/h via INTRAVENOUS
  Filled 2017-08-17: qty 2

## 2017-08-17 MED ORDER — SODIUM CHLORIDE 0.9 % IV SOLN: INTRAVENOUS | Status: DC

## 2017-08-17 MED ORDER — NITROGLYCERIN IN D5W 200-5 MCG/ML-% IV SOLN: 0.0000 ug/min | INTRAVENOUS | Status: DC

## 2017-08-17 MED ORDER — FAMOTIDINE IN NACL 20-0.9 MG/50ML-% IV SOLN
20.0000 mg | Freq: Two times a day (BID) | INTRAVENOUS | Status: DC
  Administered 2017-08-17: 20 mg via INTRAVENOUS

## 2017-08-17 SURGICAL SUPPLY — 103 items
BAG DECANTER FOR FLEXI CONT (MISCELLANEOUS) ×3 IMPLANT
BANDAGE ACE 4X5 VEL STRL LF (GAUZE/BANDAGES/DRESSINGS) IMPLANT
BANDAGE ACE 6X5 VEL STRL LF (GAUZE/BANDAGES/DRESSINGS) IMPLANT
BASKET HEART (ORDER IN 25'S) (MISCELLANEOUS) ×1
BASKET HEART (ORDER IN 25S) (MISCELLANEOUS) ×2 IMPLANT
BLADE NEEDLE 3 SS STRL (BLADE) ×3 IMPLANT
BLADE STERNUM SYSTEM 6 (BLADE) ×3 IMPLANT
BNDG GAUZE ELAST 4 BULKY (GAUZE/BANDAGES/DRESSINGS) IMPLANT
CANISTER SUCT 3000ML PPV (MISCELLANEOUS) ×3 IMPLANT
CANNULA EZ GLIDE AORTIC 21FR (CANNULA) ×3 IMPLANT
CATH CPB KIT HENDRICKSON (MISCELLANEOUS) ×3 IMPLANT
CATH ROBINSON RED A/P 18FR (CATHETERS) ×3 IMPLANT
CATH THORACIC 36FR (CATHETERS) ×3 IMPLANT
CATH THORACIC 36FR RT ANG (CATHETERS) ×3 IMPLANT
CLIP VESOCCLUDE MED 24/CT (CLIP) IMPLANT
CLIP VESOCCLUDE SM WIDE 24/CT (CLIP) ×6 IMPLANT
CRADLE DONUT ADULT HEAD (MISCELLANEOUS) ×3 IMPLANT
DRAPE CARDIOVASCULAR INCISE (DRAPES) ×3
DRAPE SLUSH/WARMER DISC (DRAPES) ×3 IMPLANT
DRAPE SRG 135X102X78XABS (DRAPES) ×2 IMPLANT
DRSG AQUACEL AG ADV 3.5X14 (GAUZE/BANDAGES/DRESSINGS) ×3 IMPLANT
DRSG COVADERM 4X14 (GAUZE/BANDAGES/DRESSINGS) ×3 IMPLANT
ELECT REM PT RETURN 9FT ADLT (ELECTROSURGICAL) ×6
ELECTRODE REM PT RTRN 9FT ADLT (ELECTROSURGICAL) ×4 IMPLANT
FELT TEFLON 1X6 (MISCELLANEOUS) ×3 IMPLANT
GAUZE SPONGE 4X4 12PLY STRL (GAUZE/BANDAGES/DRESSINGS) ×3 IMPLANT
GAUZE SPONGE 4X4 12PLY STRL LF (GAUZE/BANDAGES/DRESSINGS) ×3 IMPLANT
GLOVE BIO SURGEON STRL SZ 6.5 (GLOVE) ×15 IMPLANT
GLOVE BIOGEL PI IND STRL 6 (GLOVE) ×4 IMPLANT
GLOVE BIOGEL PI IND STRL 6.5 (GLOVE) ×4 IMPLANT
GLOVE BIOGEL PI IND STRL 7.0 (GLOVE) ×8 IMPLANT
GLOVE BIOGEL PI INDICATOR 6 (GLOVE) ×2
GLOVE BIOGEL PI INDICATOR 6.5 (GLOVE) ×2
GLOVE BIOGEL PI INDICATOR 7.0 (GLOVE) ×4
GLOVE SURG SIGNA 7.5 PF LTX (GLOVE) ×6 IMPLANT
GOWN STRL REUS W/ TWL LRG LVL3 (GOWN DISPOSABLE) ×10 IMPLANT
GOWN STRL REUS W/ TWL XL LVL3 (GOWN DISPOSABLE) ×4 IMPLANT
GOWN STRL REUS W/TWL LRG LVL3 (GOWN DISPOSABLE) ×15
GOWN STRL REUS W/TWL XL LVL3 (GOWN DISPOSABLE) ×6
HEMOSTAT POWDER SURGIFOAM 1G (HEMOSTASIS) ×9 IMPLANT
HEMOSTAT SURGICEL 2X14 (HEMOSTASIS) ×3 IMPLANT
INSERT FOGARTY XLG (MISCELLANEOUS) IMPLANT
KIT BASIN OR (CUSTOM PROCEDURE TRAY) ×3 IMPLANT
KIT ROOM TURNOVER OR (KITS) ×3 IMPLANT
KIT SUCTION CATH 14FR (SUCTIONS) ×6 IMPLANT
KIT VASOVIEW HEMOPRO VH 3000 (KITS) IMPLANT
MARKER GRAFT CORONARY BYPASS (MISCELLANEOUS) ×9 IMPLANT
NS IRRIG 1000ML POUR BTL (IV SOLUTION) ×18 IMPLANT
PACK OPEN HEART (CUSTOM PROCEDURE TRAY) ×3 IMPLANT
PAD ARMBOARD 7.5X6 YLW CONV (MISCELLANEOUS) ×6 IMPLANT
PAD ELECT DEFIB RADIOL ZOLL (MISCELLANEOUS) ×3 IMPLANT
PENCIL BUTTON HOLSTER BLD 10FT (ELECTRODE) ×3 IMPLANT
PUNCH AORTIC ROTATE 4.0MM (MISCELLANEOUS) IMPLANT
PUNCH AORTIC ROTATE 4.5MM 8IN (MISCELLANEOUS) IMPLANT
PUNCH AORTIC ROTATE 5MM 8IN (MISCELLANEOUS) IMPLANT
SET CARDIOPLEGIA MPS 5001102 (MISCELLANEOUS) ×3 IMPLANT
SPONGE LAP 4X18 X RAY DECT (DISPOSABLE) ×3 IMPLANT
SUT BONE WAX W31G (SUTURE) ×3 IMPLANT
SUT ETHIBOND 2 0 SH (SUTURE) ×12
SUT ETHIBOND 2 0 SH 36X2 (SUTURE) ×8 IMPLANT
SUT MNCRL AB 4-0 PS2 18 (SUTURE) IMPLANT
SUT PROLENE 3 0 SH DA (SUTURE) ×3 IMPLANT
SUT PROLENE 4 0 RB 1 (SUTURE) ×6
SUT PROLENE 4 0 SH DA (SUTURE) IMPLANT
SUT PROLENE 4-0 RB1 .5 CRCL 36 (SUTURE) ×4 IMPLANT
SUT PROLENE 5 0 C 1 36 (SUTURE) ×12 IMPLANT
SUT PROLENE 6 0 C 1 30 (SUTURE) ×9 IMPLANT
SUT PROLENE 7 0 BV1 MDA (SUTURE) ×3 IMPLANT
SUT PROLENE 8 0 BV175 6 (SUTURE) ×6 IMPLANT
SUT SILK  1 MH (SUTURE) ×3
SUT SILK 1 MH (SUTURE) ×6 IMPLANT
SUT SILK 1 TIES 10X30 (SUTURE) ×3 IMPLANT
SUT SILK 2 0 SH CR/8 (SUTURE) ×6 IMPLANT
SUT SILK 2 0 TIES 10X30 (SUTURE) ×3 IMPLANT
SUT SILK 2 0 TIES 17X18 (SUTURE) ×3
SUT SILK 2-0 18XBRD TIE BLK (SUTURE) ×2 IMPLANT
SUT SILK 3 0 SH CR/8 (SUTURE) ×3 IMPLANT
SUT SILK 4 0 TIE 10X30 (SUTURE) ×6 IMPLANT
SUT STEEL 6MS V (SUTURE) ×3 IMPLANT
SUT STEEL STERNAL CCS#1 18IN (SUTURE) IMPLANT
SUT STEEL SZ 6 DBL 3X14 BALL (SUTURE) ×3 IMPLANT
SUT TEM PAC WIRE 2 0 SH (SUTURE) ×9 IMPLANT
SUT VIC AB 1 CTX 36 (SUTURE) ×3
SUT VIC AB 1 CTX36XBRD ANBCTR (SUTURE) ×2 IMPLANT
SUT VIC AB 2-0 CT1 27 (SUTURE)
SUT VIC AB 2-0 CT1 TAPERPNT 27 (SUTURE) IMPLANT
SUT VIC AB 2-0 CTX 27 (SUTURE) ×6 IMPLANT
SUT VIC AB 3-0 SH 27 (SUTURE)
SUT VIC AB 3-0 SH 27X BRD (SUTURE) IMPLANT
SUT VIC AB 3-0 X1 27 (SUTURE) ×6 IMPLANT
SUT VICRYL 4-0 PS2 18IN ABS (SUTURE) IMPLANT
SUTURE E-PAK OPEN HEART (SUTURE) IMPLANT
SYSTEM SAHARA CHEST DRAIN ATS (WOUND CARE) ×3 IMPLANT
TAPE CLOTH SURG 4X10 WHT LF (GAUZE/BANDAGES/DRESSINGS) ×3 IMPLANT
TOWEL GREEN STERILE (TOWEL DISPOSABLE) ×3 IMPLANT
TOWEL GREEN STERILE FF (TOWEL DISPOSABLE) IMPLANT
TOWEL OR 17X24 6PK STRL BLUE (TOWEL DISPOSABLE) ×3 IMPLANT
TOWEL OR 17X26 10 PK STRL BLUE (TOWEL DISPOSABLE) ×3 IMPLANT
TRAY FOLEY SILVER 16FR TEMP (SET/KITS/TRAYS/PACK) ×3 IMPLANT
TUBE FEEDING 8FR 16IN STR KANG (MISCELLANEOUS) ×3 IMPLANT
TUBING INSUFFLATION (TUBING) IMPLANT
UNDERPAD 30X30 (UNDERPADS AND DIAPERS) ×6 IMPLANT
WATER STERILE IRR 1000ML POUR (IV SOLUTION) ×6 IMPLANT

## 2017-08-17 NOTE — Anesthesia Procedure Notes (Signed)
Arterial Line Insertion Start/End11/21/2018 7:55 AM, 08/17/2017 8:05 AM Performed by: Gwenlyn Hottinger T, Immunologist, CRNA  Patient location: Pre-op. Preanesthetic checklist: patient identified, IV checked, site marked, risks and benefits discussed, surgical consent, monitors and equipment checked and pre-op evaluation Lidocaine 1% used for infiltration and patient sedated Left, radial was placed Catheter size: 20 G Hand hygiene performed  and maximum sterile barriers used   Attempts: 1 Procedure performed without using ultrasound guided technique. Following insertion, dressing applied and Biopatch. Post procedure assessment: normal and unchanged  Patient tolerated the procedure well with no immediate complications.

## 2017-08-17 NOTE — Transfer of Care (Signed)
Immediate Anesthesia Transfer of Care Note  Patient: [name]  Procedure(s) Performed: CORONARY ARTERY BYPASS GRAFTING (CABG) TIMES 1 USING LEFT INTERNAL MAMMARY ARTERY (N/A Chest) TRANSESOPHAGEAL ECHOCARDIOGRAM (TEE) (N/A )  Patient Location: ICU  Anesthesia Type:General  Level of Consciousness: Patient remains intubated per anesthesia plan  Airway & Oxygen Therapy: Patient remains intubated per anesthesia plan and Patient placed on Ventilator (see vital sign flow sheet for setting)  Post-op Assessment: Report given to RN and Post -op Vital signs reviewed and stable  Post vital signs: Reviewed and stable  Last Vitals:  Vitals:   08/17/17 0557 08/17/17 1305  BP: (!) 156/54   Pulse: 84   Resp: 18   Temp: 36.7 C   SpO2: 100% 100%    Last Pain:  Vitals:   08/17/17 0557  TempSrc: Oral  PainSc:       Patients Stated Pain Goal: 0 (87/56/43 3295)  Complications: No apparent anesthesia complications

## 2017-08-17 NOTE — Op Note (Signed)
NAMETALULAH, SCHIRMER NO.:  [id number]  MEDICAL RECORD NO.:  [id number]  LOCATION:  2H13C                        FACILITY:  [facility]  PHYSICIAN:  Revonda Standard. Roxan Hockey, M.D.DATE OF BIRTH:  1951-06-23  DATE OF PROCEDURE:  08/17/2017 DATE OF DISCHARGE:                              OPERATIVE REPORT   PREOPERATIVE DIAGNOSIS:  Severe single-vessel coronary artery disease with in-stent restenosis.  POSTOPERATIVE DIAGNOSIS:  Severe single-vessel coronary artery disease with in-stent restenosis.  PROCEDURES:   Median sternotomy, extracorporeal circulation, Coronary artery bypass grafting x 1  Left internal mammary artery to LAD.  SURGEON:  Revonda Standard. Roxan Hockey, MD.  ASSISTANTEllwood Handler, PA.  ANESTHESIA:  General.  FINDINGS:  Morbid obesity.  Left mammary relatively adherent to the chest wall, but good quality with excellent flow when divided distally. Distal LAD was a good target.  Transesophageal echocardiography showed preserved left ventricular wall motion with no significant valvular abnormalities.  CLINICAL NOTE:  Mrs. Zuba is a morbidly obese 66 year old woman with a history of coronary artery disease.  She has had 3 previous interventions to her LAD, most recently in 2017.  She now presents with recurrent angina and has in-stent restenoses.  She was referred for coronary artery bypass grafting with plan for left internal mammary artery to LAD for relief of the angina.  The indications, risks, benefits, and alternatives were discussed in detail with the patient and she understood and accepted the risks and agreed to proceed.  OPERATIVE NOTE:  Ms. Medellin was brought to the preoperative holding area on August 17, 2017.  Anesthesia placed a Swan-Ganz catheter and an arterial blood pressure monitoring line.  She was taken to the operating room, anesthetized, and intubated.  Intravenous antibiotics were administered.  A Foley catheter was  placed.  The chest, abdomen, and legs were prepped and draped in usual sterile fashion.  After performing a time-out, a median sternotomy was performed and the left internal mammary artery was harvested using standard technique. This was difficult due to the patient's body habitus as well as the mammary being relatively adherent to the chest wall.  The dose of heparin for potential off-pump grafting, 12000 units, was given prior to dividing the distal end of the left mammary.  There was excellent flow through the graft.  A sternal retractor was placed and gently opened over time.  The mediastinal fat was dissected off the pericardium.  The pericardium was opened.  On inspecting the heart, attempting to do a bypass off pump would have been difficult and decision was made to do this with the heart arrested on cardiopulmonary bypass.  The remainder of the full heparin dose was given.  After confirming adequate anticoagulation with ACT measurement, the aorta was cannulated via concentric 2-0 Ethibond pledgeted pursestring sutures.  A dual-stage venous cannula was placed via pursestring suture in the right atrial appendage.  Cardiopulmonary bypass was initiated. Flows were maintained per protocol.  The patient was maintained at normothermia.  The LAD was inspected and anastomotic site was chosen. The LIMA was prepared.  The cardioplegia cannula was placed in the ascending aorta and a temperature probe was placed in the myocardial septum.  The aorta was crossclamped.  Cardiac arrest then was achieved with 1 L of cold blood antegrade cardioplegia.  There was a rapid diastolic arrest and septal cooling to 10 degrees Celsius.  The left internal mammary artery then was anastomosed end-to-side to the LAD with a running 8-0 Prolene suture.  At the completion of anastomosis, the bulldog clamp was removed.  There was good hemostasis. The mammary pedicle was tacked to the epicardial surface of the  heart with 6-0 Prolene sutures.  Both the mammary artery and LAD were good quality vessels at the site of the anastomosis.  The patient was placed in Trendelenburg position.  Lidocaine was administered.  The aortic crossclamp was removed.  The total crossclamp time was 15 minutes.  She required a single defibrillation with 10 joules and then was in heart block thereafter.  Epicardial pacing wires were placed on the right ventricle and right atrium, DDD pacing was initiated.  She then was weaned from cardiopulmonary bypass on the first attempt without difficulty.  The total bypass time was 35 minutes.  Post-bypass transesophageal echocardiography was unchanged from the prebypass study with preserved left ventricular wall motion with no significant valvular pathology.  A test dose of protamine was administered and it was well tolerated.  The atrial and aortic cannulae were removed.  The remainder of the protamine was administered without incident.  The chest was irrigated with warm saline.  Hemostasis was achieved.  The pericardium was reapproximated over the ascending aorta and heart with interrupted 3- 0 silk sutures.  It came together easily without tension.  Left pleural mediastinal chest tubes were placed through separate subcostal incisions.  The sternum was closed with a combination of single and double heavy gauge stainless steel wires.  The pectoralis fascia, subcutaneous tissue, and skin were closed in a standard fashion.  All sponge, needle, and instrument counts were correct at the end of the procedure.  The patient was taken from the operating room to the Surgical Intensive Care Unit intubated and in good condition.     Revonda Standard Roxan Hockey, M.D.     SCH/MEDQ  D:  08/17/2017  T:  08/17/2017  Job:  [id number]

## 2017-08-17 NOTE — Anesthesia Procedure Notes (Signed)
Central Venous Catheter Insertion Performed by: Effie Berkshire, MD, anesthesiologist Start/End11/21/2018 8:00 AM, 08/17/2017 8:05 AM Patient location: Pre-op. Preanesthetic checklist: patient identified, IV checked, site marked, risks and benefits discussed, surgical consent, monitors and equipment checked, pre-op evaluation, timeout performed and anesthesia consent Hand hygiene performed  and maximum sterile barriers used  PA cath was placed.Swan type:thermodilution Procedure performed without using ultrasound guided technique. Attempts: 1 Patient tolerated the procedure well with no immediate complications.

## 2017-08-17 NOTE — Anesthesia Procedure Notes (Signed)
Central Venous Catheter Insertion Performed by: Effie Berkshire, MD, anesthesiologist Start/End11/21/2018 7:50 AM, 08/17/2017 8:00 AM Patient location: Pre-op. Preanesthetic checklist: patient identified, IV checked, site marked, risks and benefits discussed, surgical consent, monitors and equipment checked, pre-op evaluation, timeout performed and anesthesia consent Position: Trendelenburg Lidocaine 1% used for infiltration and patient sedated Hand hygiene performed , maximum sterile barriers used  and Seldinger technique used Catheter size: 9 Fr Total catheter length 10. Central line was placed.Sheath introducer Swan type:thermodilution PA Cath depth:50 Procedure performed using ultrasound guided technique. Ultrasound Notes:anatomy identified, needle tip was noted to be adjacent to the nerve/plexus identified, no ultrasound evidence of intravascular and/or intraneural injection and image(s) printed for medical record Attempts: 1 Following insertion, line sutured and dressing applied. Post procedure assessment: blood return through all ports, free fluid flow and no air  Patient tolerated the procedure well with no immediate complications.

## 2017-08-17 NOTE — Brief Op Note (Addendum)
08/09/2017 - 08/17/2017  5:35 PM  PATIENT:  [name]  66 y.o. female  PRE-OPERATIVE DIAGNOSIS:  CAD  POST-OPERATIVE DIAGNOSIS:  CAD  PROCEDURE:  Procedure(s):  CORONARY ARTERY BYPASS GRAFTING x 1 -LIMA to LAD  TRANSESOPHAGEAL ECHOCARDIOGRAM (TEE) (N/A)  SURGEON:  Surgeon(s) and Role:    * Melrose Nakayama, MD - Primary  PHYSICIAN ASSISTANT: Erin Barrett PA-C   ANESTHESIA:   general  EBL:  600 ml  BLOOD ADMINISTERED:CELLSAVER  DRAINS: Left Pleural Chest Tube, Mediastinal Chest drains   LOCAL MEDICATIONS USED:  NONE  SPECIMEN:  No Specimen  DISPOSITION OF SPECIMEN:  N/A  COUNTS:  YES  TOURNIQUET:  * No tourniquets in log *  DICTATION: .Dragon Dictation  PLAN OF CARE: Admit to inpatient   PATIENT DISPOSITION:  ICU - intubated and hemodynamically stable.   Delay start of Pharmacological VTE agent (>24hrs) due to surgical blood loss or risk of bleeding: yes

## 2017-08-17 NOTE — Anesthesia Postprocedure Evaluation (Addendum)
Anesthesia Post Note  Patient: [name]  Procedure(s) Performed: CORONARY ARTERY BYPASS GRAFTING (CABG) TIMES 1 USING LEFT INTERNAL MAMMARY ARTERY (N/A Chest) TRANSESOPHAGEAL ECHOCARDIOGRAM (TEE) (N/A )     Patient location during evaluation: SICU Anesthesia Type: General Level of consciousness: sedated Pain management: pain level controlled Vital Signs Assessment: post-procedure vital signs reviewed and stable Respiratory status: patient remains intubated per anesthesia plan Cardiovascular status: stable Postop Assessment: no apparent nausea or vomiting Anesthetic complications: no    Last Vitals:  Vitals:   08/17/17 1530 08/17/17 1545  BP:    Pulse: 88 88  Resp: 12 (!) 34  Temp: (!) 35.6 C (!) 35.8 C  SpO2: 100% 100%    Last Pain:  Vitals:   08/17/17 1400  TempSrc: Core  PainSc:                  Tiajuana Amass

## 2017-08-17 NOTE — Progress Notes (Signed)
  Echocardiogram Echocardiogram Transesophageal has been performed.  Bobbye Charleston 08/17/2017, 10:02 AM

## 2017-08-17 NOTE — Progress Notes (Signed)
RT note-starting wean at this time.

## 2017-08-17 NOTE — Anesthesia Procedure Notes (Signed)
Procedure Name: Intubation Date/Time: 08/17/2017 8:57 AM Performed by: Tajia Szeliga T, CRNA Pre-anesthesia Checklist: Patient identified, Emergency Drugs available, Suction available and Patient being monitored Patient Re-evaluated:Patient Re-evaluated prior to induction Oxygen Delivery Method: Circle system utilized Preoxygenation: Pre-oxygenation with 100% oxygen Induction Type: IV induction Ventilation: Mask ventilation without difficulty and Oral airway inserted - appropriate to patient size Laryngoscope Size: Miller and 2 Grade View: Grade I Tube type: Subglottic suction tube Tube size: 7.5 mm Number of attempts: 1 Airway Equipment and Method: Patient positioned with wedge pillow and Stylet Placement Confirmation: ETT inserted through vocal cords under direct vision,  positive ETCO2 and breath sounds checked- equal and bilateral Secured at: 21 cm Tube secured with: Tape Dental Injury: Teeth and Oropharynx as per pre-operative assessment

## 2017-08-17 NOTE — Procedures (Signed)
Extubation Procedure Note  Patient Details:   Name: [name] DOB: December 20, 1950 MRN: [id number]   Airway Documentation:     Evaluation  O2 sats: stable throughout Complications: No apparent complications Patient did tolerate procedure well. Bilateral Breath Sounds: Clear, Diminished   Yes  4l/min Terry sp02 100% Incentive spirometer performed.  Revonda Standard 08/17/2017, 6:04 PM

## 2017-08-17 NOTE — Addendum Note (Signed)
Addendum  created 08/17/17 1647 by Suzette Battiest, MD   Diagnosis association updated

## 2017-08-17 NOTE — Interval H&P Note (Signed)
History and Physical Interval Note:  08/17/2017 8:17 AM  [name]  has presented today for surgery, with the diagnosis of CAD  The various methods of treatment have been discussed with the patient and family. After consideration of risks, benefits and other options for treatment, the patient has consented to  Procedure(s): CORONARY ARTERY BYPASS GRAFTING (CABG) (N/A) TRANSESOPHAGEAL ECHOCARDIOGRAM (TEE) (N/A) as a surgical intervention .  The patient's history has been reviewed, patient examined, no change in status, stable for surgery.  I have reviewed the patient's chart and labs.  Questions were answered to the patient's satisfaction.     Melrose Nakayama

## 2017-08-17 NOTE — Progress Notes (Signed)
Patient ID: [name], female   DOB: 1951-09-06, 66 y.o.   MRN: [id number]  TCTS Evening Rounds:   Hemodynamically stable  CI = 2.6  Extubated   Urine output good  CT output low  CBC    Component Value Date/Time   WBC 12.3 (H) 08/17/2017 1300   RBC 3.35 (L) 08/17/2017 1300   HGB 9.5 (L) 08/17/2017 1300   HGB 13.0 09/15/2015 0958   HCT 28.3 (L) 08/17/2017 1300   HCT 38.8 09/15/2015 0958   PLT 164 08/17/2017 1300   PLT 164 09/15/2015 0958   MCV 84.5 08/17/2017 1300   MCV 85.7 09/15/2015 0958   MCH 28.4 08/17/2017 1300   MCHC 33.6 08/17/2017 1300   RDW 13.2 08/17/2017 1300   RDW 12.9 09/15/2015 0958   LYMPHSABS 1.4 09/15/2015 0958   MONOABS 0.6 09/15/2015 0958   EOSABS 0.2 09/15/2015 0958   BASOSABS 0.0 09/15/2015 0958     BMET    Component Value Date/Time   NA 142 08/17/2017 1118   NA 137 09/15/2015 0959   K 3.6 08/17/2017 1118   K 4.0 09/15/2015 0959   CL 101 08/17/2017 1118   CL 99 09/12/2012 0959   CO2 27 08/17/2017 0501   CO2 27 09/15/2015 0959   GLUCOSE 143 (H) 08/17/2017 1118   GLUCOSE 206 (H) 09/15/2015 0959   GLUCOSE 217 (H) 09/12/2012 0959   BUN 9 08/17/2017 1118   BUN 16.1 09/15/2015 0959   CREATININE 0.50 08/17/2017 1118   CREATININE 1.03 (H) 01/30/2016 1058   CREATININE 1.2 (H) 09/15/2015 0959   CALCIUM 9.1 08/17/2017 0501   CALCIUM 9.9 09/15/2015 0959   GFRNONAA >60 08/17/2017 0501   GFRAA >60 08/17/2017 0501     A/P:  Stable postop course. Continue current plans

## 2017-08-18 ENCOUNTER — Inpatient Hospital Stay (HOSPITAL_COMMUNITY): Payer: Medicare HMO

## 2017-08-18 LAB — GLUCOSE, CAPILLARY
GLUCOSE-CAPILLARY: 119 mg/dL — AB (ref 65–99)
GLUCOSE-CAPILLARY: 123 mg/dL — AB (ref 65–99)
GLUCOSE-CAPILLARY: 125 mg/dL — AB (ref 65–99)
GLUCOSE-CAPILLARY: 126 mg/dL — AB (ref 65–99)
GLUCOSE-CAPILLARY: 127 mg/dL — AB (ref 65–99)
GLUCOSE-CAPILLARY: 143 mg/dL — AB (ref 65–99)
Glucose-Capillary: 117 mg/dL — ABNORMAL HIGH (ref 65–99)
Glucose-Capillary: 121 mg/dL — ABNORMAL HIGH (ref 65–99)
Glucose-Capillary: 128 mg/dL — ABNORMAL HIGH (ref 65–99)
Glucose-Capillary: 127 mg/dL — ABNORMAL HIGH (ref 65–99)
Glucose-Capillary: 161 mg/dL — ABNORMAL HIGH (ref 65–99)
Glucose-Capillary: 127 mg/dL — ABNORMAL HIGH (ref 65–99)
Glucose-Capillary: 167 mg/dL — ABNORMAL HIGH (ref 65–99)

## 2017-08-18 LAB — BASIC METABOLIC PANEL
ANION GAP: 6 (ref 5–15)
BUN: 11 mg/dL (ref 6–20)
CHLORIDE: 109 mmol/L (ref 101–111)
CO2: 22 mmol/L (ref 22–32)
Calcium: 8.6 mg/dL — ABNORMAL LOW (ref 8.9–10.3)
Creatinine, Ser: 1.04 mg/dL — ABNORMAL HIGH (ref 0.44–1.00)
GFR calc Af Amer: >60 mL/min (ref >60)
GFR, EST NON AFRICAN AMERICAN: 54 mL/min — AB (ref >60)
GLUCOSE: 120 mg/dL — AB (ref 65–99)
POTASSIUM: 4.3 mmol/L (ref 3.5–5.1)
Sodium: 137 mmol/L (ref 135–145)

## 2017-08-18 LAB — MAGNESIUM
Magnesium: 2.4 mg/dL (ref 1.7–2.4)
Magnesium: 2.5 mg/dL — ABNORMAL HIGH (ref 1.7–2.4)

## 2017-08-18 LAB — CBC
HCT: 25.8 % — ABNORMAL LOW (ref 36.0–46.0)
HEMATOCRIT: 26.9 % — AB (ref 36.0–46.0)
HEMOGLOBIN: 8.9 g/dL — AB (ref 12.0–15.0)
HEMOGLOBIN: 8.4 g/dL — AB (ref 12.0–15.0)
MCH: 28.5 pg (ref 26.0–34.0)
MCH: 28.1 pg (ref 26.0–34.0)
MCHC: 33.1 g/dL (ref 30.0–36.0)
MCHC: 32.6 g/dL (ref 30.0–36.0)
MCV: 86.2 fL (ref 78.0–100.0)
MCV: 86.3 fL (ref 78.0–100.0)
PLATELETS: 184 10*3/uL (ref 150–400)
PLATELETS: 152 10*3/uL (ref 150–400)
RBC: 3.12 MIL/uL — AB (ref 3.87–5.11)
RBC: 2.99 MIL/uL — AB (ref 3.87–5.11)
RDW: 14 % (ref 11.5–15.5)
RDW: 14.1 % (ref 11.5–15.5)
WBC: 15.6 10*3/uL — AB (ref 4.0–10.5)
WBC: 11.3 10*3/uL — AB (ref 4.0–10.5)

## 2017-08-18 LAB — CREATININE, SERUM
Creatinine, Ser: 1.21 mg/dL — ABNORMAL HIGH (ref 0.44–1.00)
GFR calc non Af Amer: 45 mL/min — ABNORMAL LOW (ref >60)
GFR, EST AFRICAN AMERICAN: 52 mL/min — AB (ref >60)

## 2017-08-18 MED ORDER — METOPROLOL TARTRATE 25 MG PO TABS
25.0000 mg | ORAL_TABLET | Freq: Two times a day (BID) | ORAL | Status: DC
  Administered 2017-08-19 – 2017-08-23 (×9): 25 mg via ORAL
  Filled 2017-08-18 (×10): qty 1

## 2017-08-18 MED ORDER — CHLORHEXIDINE GLUCONATE CLOTH 2 % EX PADS
6.0000 | MEDICATED_PAD | Freq: Every day | CUTANEOUS | Status: DC
  Administered 2017-08-18 – 2017-08-22 (×5): 6 via TOPICAL

## 2017-08-18 MED ORDER — ENOXAPARIN SODIUM 30 MG/0.3ML ~~LOC~~ SOLN: 30.0000 mg | Freq: Every day | SUBCUTANEOUS | Status: DC

## 2017-08-18 MED ORDER — FUROSEMIDE 10 MG/ML IJ SOLN
40.0000 mg | Freq: Once | INTRAMUSCULAR | Status: AC
  Administered 2017-08-18: 40 mg via INTRAVENOUS
  Filled 2017-08-18: qty 4

## 2017-08-18 MED ORDER — INSULIN ASPART 100 UNIT/ML ~~LOC~~ SOLN
0.0000 [IU] | SUBCUTANEOUS | Status: DC
  Administered 2017-08-18: 2 [IU] via SUBCUTANEOUS
  Administered 2017-08-18: 4 [IU] via SUBCUTANEOUS
  Administered 2017-08-18 – 2017-08-19 (×3): 2 [IU] via SUBCUTANEOUS
  Administered 2017-08-19: 4 [IU] via SUBCUTANEOUS

## 2017-08-18 MED ORDER — INSULIN DETEMIR 100 UNIT/ML ~~LOC~~ SOLN
20.0000 [IU] | Freq: Two times a day (BID) | SUBCUTANEOUS | Status: DC
  Administered 2017-08-18 – 2017-08-22 (×10): 20 [IU] via SUBCUTANEOUS
  Filled 2017-08-18 (×13): qty 0.2

## 2017-08-18 NOTE — Progress Notes (Signed)
TCTS BRIEF SICU PROGRESS NOTE  1 Day Post-Op  S/P Procedure(s) (LRB): CORONARY ARTERY BYPASS GRAFTING (CABG) TIMES 1 USING LEFT INTERNAL MAMMARY ARTERY (N/A) TRANSESOPHAGEAL ECHOCARDIOGRAM (TEE) (N/A)   Stable day NSR w/ stable BP although somewhat hypertensive Breathing comfortably on room air UOP adequate  Plan: Increase metoprolol to preop dose.  Consider restarting ACE-I tomorrow  Rexene Alberts, MD 08/18/2017 6:06 PM

## 2017-08-18 NOTE — Progress Notes (Signed)
Called Dr. Cyndia Bent, discussed pt's low UOP and high PAP in setting of low BP on 54mcg Neo. CI >2, pt resting comfortably, no acute distress or respiratory issues. H&H stable this AM. Given one-time order of 40mg  IV Lasix.  Will administer and continue to monitor closely.  Henreitta Leber, RN 08/18/17

## 2017-08-18 NOTE — Progress Notes (Signed)
[address]       [place] [address]             [phone]        CARDIOTHORACIC SURGERY PROGRESS NOTE   R1 Day Post-Op Procedure(s) (LRB): CORONARY ARTERY BYPASS GRAFTING (CABG) TIMES 1 USING LEFT INTERNAL MAMMARY ARTERY (N/A) TRANSESOPHAGEAL ECHOCARDIOGRAM (TEE) (N/A)  Subjective: Feels pretty well.  Mild soreness in chest.  Objective: Vital signs: BP Readings from Last 1 Encounters:  08/18/17 (!) 113/48   Pulse Readings from Last 1 Encounters:  08/18/17 86   Resp Readings from Last 1 Encounters:  08/18/17 (!) 25   Temp Readings from Last 1 Encounters:  08/18/17 99 F (37.2 C)    Hemodynamics: PAP: (23-70)/(10-27) 66/24 CO:  [3.3 L/min-5.9 L/min] 5.9 L/min CI:  [1.5 L/min/m2-2.6 L/min/m2] 2.6 L/min/m2  Physical Exam:  Rhythm:   sinus  Breath sounds: clear  Heart sounds:  RRR   Incisions:  Dressings dry, intact  Abdomen:  Soft, non-distended, non-tender  Extremities:  Warm, well-perfused  Chest tubes:  low volume thin serosanguinous output, no air leak     Intake/Output from previous day: 11/21 0701 - 11/22 0700 In: 5068 [P.O.:70; I.V.:3147; Blood:251; IV Piggyback:1600] Out: 0354 [Urine:2130; Blood:1200; Chest Tube:610] Intake/Output this shift: Total I/O In: 236.3 [P.O.:120; I.V.:66.3; IV Piggyback:50] Out: 60 [Urine:60]  Lab Results:  CBC: Recent Labs    08/17/17 1835 08/17/17 1847 08/18/17 0446  WBC 11.8*  --  15.6*  HGB 9.4* 9.2* 8.9*  HCT 28.2* 27.0* 26.9*  PLT 150  --  184    BMET:  Recent Labs    08/17/17 0501  08/17/17 1847 08/18/17 0446  NA 135   < > 138 137  K 3.8   < > 4.6 4.3  CL 105   < > 107 109  CO2 27  --   --  22  GLUCOSE 263*   < > 143* 120*  BUN 13   < > 9 11  CREATININE 0.94   < > 0.60 1.04*  CALCIUM 9.1  --   --  8.6*   < > = values in this interval not displayed.     PT/INR:   Recent Labs    08/17/17 1300  LABPROT 16.4*  INR 1.34    CBG (last 3)  Recent Labs     08/18/17 0700 08/18/17 0756 08/18/17 0928  GLUCAP 127* 161* 143*    ABG    Component Value Date/Time   PHART 7.363 08/17/2017 1843   PCO2ART 41.2 08/17/2017 1843   PO2ART 195.0 (H) 08/17/2017 1843   HCO3 23.7 08/17/2017 1843   TCO2 24 08/17/2017 1847   ACIDBASEDEF 2.0 08/17/2017 1843   O2SAT 100.0 08/17/2017 1843    CXR: PORTABLE CHEST 1 VIEW  COMPARISON:  08/18/2007  FINDINGS: Endotracheal tube has been removed. NG tube is been removed. Swan-Ganz catheter remains in the main pulmonary artery. Left chest tube in place.  Negative for pneumothorax  Mild bibasilar atelectasis unchanged.  Progression of vascular congestion and mild edema.  IMPRESSION: Progression of vascular congestion and mild edema  Bibasilar atelectasis unchanged. No pneumothorax. Endotracheal tube and NG tube removed.   Electronically Signed   By: Franchot Gallo M.D.   On: 08/18/2017 09:06  EKG: NSR w/out acute ischemic changes, RBBB (old)   Assessment/Plan: S/P Procedure(s) (LRB): CORONARY ARTERY BYPASS GRAFTING (CABG) TIMES 1 USING LEFT INTERNAL MAMMARY ARTERY (N/A) TRANSESOPHAGEAL ECHOCARDIOGRAM (TEE) (N/A)  Overall doing well  POD1 Maintaining NSR w/ stable hemodynamics on low dose Neo for BP support Breathing comfortably w/ O2 sats 96-100% on RA Expected post op acute blood loss anemia, stable Expected post op volume excess, weight reportedly 12 lbs > preop Type II diabetes mellitus, excellent glycemic control on insulin drip   Mobilize  D/C lines and tubes  Hold diuretics until BP improves  Lovenox for DVT prophylaxsis  Add levemir and wean insulin drip   Rexene Alberts, MD 08/18/2017 10:04 AM

## 2017-08-19 ENCOUNTER — Inpatient Hospital Stay (HOSPITAL_COMMUNITY): Payer: Medicare HMO

## 2017-08-19 ENCOUNTER — Encounter (HOSPITAL_COMMUNITY): Payer: Self-pay | Admitting: Thoracic Surgery (Cardiothoracic Vascular Surgery)

## 2017-08-19 LAB — GLUCOSE, CAPILLARY
GLUCOSE-CAPILLARY: 144 mg/dL — AB (ref 65–99)
GLUCOSE-CAPILLARY: 151 mg/dL — AB (ref 65–99)
GLUCOSE-CAPILLARY: 165 mg/dL — AB (ref 65–99)
GLUCOSE-CAPILLARY: 172 mg/dL — AB (ref 65–99)
Glucose-Capillary: 168 mg/dL — ABNORMAL HIGH (ref 65–99)
Glucose-Capillary: 190 mg/dL — ABNORMAL HIGH (ref 65–99)

## 2017-08-19 LAB — CBC
HEMATOCRIT: 25.5 % — AB (ref 36.0–46.0)
HEMOGLOBIN: 8.4 g/dL — AB (ref 12.0–15.0)
MCH: 28.7 pg (ref 26.0–34.0)
MCHC: 32.9 g/dL (ref 30.0–36.0)
MCV: 87 fL (ref 78.0–100.0)
Platelets: 147 10*3/uL — ABNORMAL LOW (ref 150–400)
RBC: 2.93 MIL/uL — AB (ref 3.87–5.11)
RDW: 14.1 % (ref 11.5–15.5)
WBC: 11.5 10*3/uL — ABNORMAL HIGH (ref 4.0–10.5)

## 2017-08-19 LAB — BASIC METABOLIC PANEL
ANION GAP: 3 — AB (ref 5–15)
BUN: 14 mg/dL (ref 6–20)
CALCIUM: 8.6 mg/dL — AB (ref 8.9–10.3)
CO2: 26 mmol/L (ref 22–32)
Chloride: 107 mmol/L (ref 101–111)
Creatinine, Ser: 1.22 mg/dL — ABNORMAL HIGH (ref 0.44–1.00)
GFR, EST AFRICAN AMERICAN: 52 mL/min — AB (ref >60)
GFR, EST NON AFRICAN AMERICAN: 45 mL/min — AB (ref >60)
Glucose, Bld: 146 mg/dL — ABNORMAL HIGH (ref 65–99)
POTASSIUM: 4.1 mmol/L (ref 3.5–5.1)
SODIUM: 136 mmol/L (ref 135–145)

## 2017-08-19 MED ORDER — WARFARIN SODIUM 5 MG PO TABS
5.0000 mg | ORAL_TABLET | Freq: Once | ORAL | Status: AC
  Administered 2017-08-19: 5 mg via ORAL
  Filled 2017-08-19: qty 1

## 2017-08-19 MED ORDER — FUROSEMIDE 40 MG PO TABS
40.0000 mg | ORAL_TABLET | Freq: Every day | ORAL | Status: DC
Start: 2017-08-19 — End: 2017-08-23
  Administered 2017-08-19 – 2017-08-23 (×5): 40 mg via ORAL
  Filled 2017-08-19 (×5): qty 1

## 2017-08-19 MED ORDER — INSULIN ASPART 100 UNIT/ML ~~LOC~~ SOLN: 0.0000 [IU] | Freq: Every day | SUBCUTANEOUS | Status: DC

## 2017-08-19 MED ORDER — MAGNESIUM SULFATE 4 GM/100ML IV SOLN
INTRAVENOUS | Status: AC
  Filled 2017-08-19: qty 100

## 2017-08-19 MED ORDER — WARFARIN - PHYSICIAN DOSING INPATIENT
Freq: Every day | Status: DC
  Administered 2017-08-20 – 2017-08-23 (×3)

## 2017-08-19 MED ORDER — POTASSIUM CHLORIDE CRYS ER 10 MEQ PO TBCR
20.0000 meq | EXTENDED_RELEASE_TABLET | Freq: Every day | ORAL | Status: DC
  Administered 2017-08-19 – 2017-08-23 (×5): 20 meq via ORAL
  Filled 2017-08-19 (×11): qty 2

## 2017-08-19 MED ORDER — GLIMEPIRIDE 4 MG PO TABS
2.0000 mg | ORAL_TABLET | Freq: Every day | ORAL | Status: DC
  Administered 2017-08-19 – 2017-08-23 (×5): 2 mg via ORAL
  Filled 2017-08-19 (×5): qty 1

## 2017-08-19 MED ORDER — INSULIN ASPART 100 UNIT/ML ~~LOC~~ SOLN
0.0000 [IU] | Freq: Three times a day (TID) | SUBCUTANEOUS | Status: DC
Start: 2017-08-19 — End: 2017-08-23
  Administered 2017-08-19 – 2017-08-21 (×5): 4 [IU] via SUBCUTANEOUS
  Administered 2017-08-21: 7 [IU] via SUBCUTANEOUS
  Administered 2017-08-23: 4 [IU] via SUBCUTANEOUS

## 2017-08-19 NOTE — Clinical Social Work Note (Signed)
Clinical Social Work Assessment  Patient Details  Name: [name] MRN: [id number] Date of Birth: 01-25-1951  Date of referral:  08/19/17               Reason for consult:  Facility Placement                Permission sought to share information with:  Facility Sport and exercise psychologist, Family Supports Permission granted to share information::  Yes, Verbal Permission Granted  Name::        Agency::  SNF  Relationship::  brother  Contact Information:     Housing/Transportation Living arrangements for the past 2 months:  Single Family Home Source of Information:  Patient Patient Interpreter Needed:  None Criminal Activity/Legal Involvement Pertinent to Current Situation/Hospitalization:  No - Comment as needed Significant Relationships:  Adult Children Lives with:  Adult Children Do you feel safe going back to the place where you live?  No Need for family participation in patient care:  No (Coment)  Care giving concerns:  Pt lives at home with son but son works during the day- has no other supports that could be with her during they day at [place].   Social Worker assessment / plan:  CSW spoke with pt and pt brother at bedside concerning PT recommendations for SNF.  Pt has been to SNF in the past for long lengths of time (last stay 2015) and is familiar with SNF and referral process.  Employment status:  Retired Nurse, adult PT Recommendations:  [place] / Referral to community resources:  [place]  Patient/Family's Response to care:  Pt agreeable to SNF stay while she is in recovery.  Patient/Family's Understanding of and Emotional Response to Diagnosis, Current Treatment, and Prognosis:  No questions or concerns hopeful that she will only need short time at SNF prior to return home.  Emotional Assessment Appearance:  Appears stated age Attitude/Demeanor/Rapport:    Affect (typically observed):   Appropriate, Pleasant Orientation:  Oriented to Self, Oriented to Situation, Oriented to Place, Oriented to  Time Alcohol / Substance use:  Not Applicable Psych involvement (Current and /or in the community):  No (Comment)  Discharge Needs  Concerns to be addressed:  Care Coordination Readmission within the last 30 days:  No Current discharge risk:  Physical Impairment Barriers to Discharge:  Continued Medical Work up   Jorge Ny, LCSW 08/19/2017, 2:24 PM

## 2017-08-19 NOTE — Progress Notes (Signed)
Attempt Rt PICC insertion unsuccessful.  Difficulty obtaining vein access; obtain after 4 attempts by two PICC nurses.  Unable to thread PICC centrally meeting resistance about the area of distal subclavian.  Different positions and techniques attempted without success.  If PICC still desired may consider IR placement.

## 2017-08-19 NOTE — Progress Notes (Signed)
TCTS BRIEF SICU PROGRESS NOTE  2 Days Post-Op  S/P Procedure(s) (LRB): CORONARY ARTERY BYPASS GRAFTING (CABG) TIMES 1 USING LEFT INTERNAL MAMMARY ARTERY (N/A) TRANSESOPHAGEAL ECHOCARDIOGRAM (TEE) (N/A)   Stable day NSR w/ stable BP Breathing comfortably w/ O2 sats 95-100% on RA  Plan: Continue current plan  Rexene Alberts, MD 08/19/2017 6:01 PM

## 2017-08-19 NOTE — Progress Notes (Signed)
Instructed patient on use of flutter valve. Left by bedside. Will continue to monitor use.

## 2017-08-19 NOTE — Progress Notes (Signed)
Physical Therapy Treatment Patient Details Name: [name] MRN: [id number] DOB: May 11, 1951 Today's Date: 08/19/2017    History of Present Illness Pt is a 66 y.o. female with medical history significant of IDDM; OSA not on CPAP; CAD; PVD; afib on Coumadin; remote lung cancer; HTN; HLD; and s/p L TKA. She was admitted with chest pain. Pt underwent CABG 08-17-17.      PT Comments    Pt now s/p CABG with decr in mobility post op. Goals re-set. Mobility currently poor. Recommend ST-SNF.   Follow Up Recommendations  SNF     Equipment Recommendations  Rolling walker with 5" wheels    Recommendations for Other Services       Precautions / Restrictions Precautions Precautions: Sternal    Mobility  Bed Mobility Overal bed mobility: Needs Assistance Bed Mobility: Sit to Supine       Sit to supine: +2 for physical assistance;Total assist   General bed mobility comments: Assist to lower trunk and to bring legs up into the bed  Transfers Overall transfer level: Needs assistance Equipment used: None(EVA walker) Transfers: Sit to/from Stand Sit to Stand: +2 physical assistance;Max assist         General transfer comment: Assist to bring up hips. Used rocking momentum. Took two attempts.  Ambulation/Gait Ambulation/Gait assistance: +2 physical assistance;Mod assist Ambulation Distance (Feet): 2 Feet Assistive device: None(EVA walker) Gait Pattern/deviations: Step-to pattern;Decreased step length - right;Decreased step length - left;Shuffle;Trunk flexed Gait velocity: decr Gait velocity interpretation: Below normal speed for age/gender General Gait Details: Assist for balance and support. pt in flexed posture   Stairs            Wheelchair Mobility    Modified Rankin (Stroke Patients Only)       Balance Overall balance assessment: Needs assistance Sitting-balance support: Bilateral upper extremity supported;Feet supported Sitting balance-Leahy Scale:  Poor Sitting balance - Comments: UE support   Standing balance support: Bilateral upper extremity supported Standing balance-Leahy Scale: Zero Standing balance comment: Eva walker and +2 assist                            Cognition Arousal/Alertness: Awake/alert Behavior During Therapy: WFL for tasks assessed/performed Overall Cognitive Status: Within Functional Limits for tasks assessed                                        Exercises      General Comments        Pertinent Vitals/Pain Pain Assessment: Faces Faces Pain Scale: Hurts little more Pain Location: incisional Pain Descriptors / Indicators: Grimacing;Guarding Pain Intervention(s): Limited activity within patient's tolerance;Monitored during session    Home Living Family/patient expects to be discharged to:: Private residence Living Arrangements: Children Available Help at Discharge: Family;Available 24 hours/day Type of Home: House Home Access: Level entry   Home Layout: One level Home Equipment: Bedside commode;Walker - 2 wheels;Cane - quad;Shower seat;Grab bars - tub/shower      Prior Function Level of Independence: Needs assistance  Gait / Transfers Assistance Needed: Ambulates mod I with RW. Drives herself to MD appts. ADL's / Homemaking Assistance Needed: Mod I with self care. Assist for IADLs. Son does cooking and cleaning.     PT Goals (current goals can now be found in the care plan section) Acute Rehab PT Goals PT Goal Formulation: With patient Time  For Goal Achievement: 09/02/17 Potential to Achieve Goals: Fair Progress towards PT goals: Goals downgraded-see care plan    Frequency    Min 3X/week      PT Plan Discharge plan needs to be updated    Co-evaluation              AM-PAC PT "6 Clicks" Daily Activity  Outcome Measure  Difficulty turning over in bed (including adjusting bedclothes, sheets and blankets)?: Unable Difficulty moving from lying on  back to sitting on the side of the bed? : Unable Difficulty sitting down on and standing up from a chair with arms (e.g., wheelchair, bedside commode, etc,.)?: Unable Help needed moving to and from a bed to chair (including a wheelchair)?: Total Help needed walking in hospital room?: Total Help needed climbing 3-5 steps with a railing? : Total 6 Click Score: 6    End of Session   Activity Tolerance: Patient limited by fatigue Patient left: in bed;with call bell/phone within reach;with nursing/sitter in room Nurse Communication: Mobility status PT Visit Diagnosis: Other abnormalities of gait and mobility (R26.89);Muscle weakness (generalized) (M62.81);Difficulty in walking, not elsewhere classified (R26.2)     Time: 3500-9381 PT Time Calculation (min) (ACUTE ONLY): 21 min  Charges:                       G Codes:       [facility] PT [place] 08/19/2017, 10:59 AM

## 2017-08-19 NOTE — NC FL2 (Signed)
[place] LEVEL OF CARE SCREENING TOOL     IDENTIFICATION  Patient Name: [name] Birthdate: 1950-12-31 Sex: female Admission Date (Current Location): 08/09/2017  [facility] and [place] Number:  Herbalist and Address:  [employer]. [facility], [place] [address], [place], [place] [address]      Provider Number: [id number]  Attending Physician Name and Address:  Melrose Nakayama, MD  Relative Name and Phone Number:       Current Level of Care: Hospital Recommended Level of Care: [place] Prior Approval Number:    Date Approved/Denied:   PASRR Number: [id number]  Discharge Plan: SNF    Current Diagnoses: Patient Active Problem List   Diagnosis Date Noted  . S/P CABG x 1 08/17/2017  . Abnormal nuclear stress test 02/05/2016  . Chest pain with high risk for cardiac etiology 02/05/2016  . CAD S/P percutaneous coronary angioplasty 02/05/2016  . GERD (gastroesophageal reflux disease) 01/06/2016  . CKD (chronic kidney disease) stage 3, GFR 30-59 ml/min (HCC) 01/06/2016  . Chronic combined systolic and diastolic heart failure ([place]) 06/28/2015  . Esophageal reflux 02/15/2014  . Anemia 01/17/2014  . S/P left TK revision 12/31/2013  . Knee osteomyelitis, left ([place]) 11/21/2013  . Foreign body of knee with infection 11/18/2013  . Unspecified constipation 11/07/2013  . Expected blood loss anemia 11/06/2013  . Left knee spacer, following removal of joint prosthesis 11/05/2013  . DM (diabetes mellitus), type 2 with peripheral vascular complications ([place]) 36/64/4034  . Onychomycosis 10/23/2013  . Pain, foot 10/23/2013  . DJD (degenerative joint disease) of knee 04/10/2013  . Vaginal atrophy 01/01/2013  . OAB (overactive bladder) 01/01/2013  . Osteopenia 01/01/2013  . Chest pain 11/28/2012  . Palpitations 11/22/2012  . Dyslipidemia 11/22/2012  . Chronic anticoagulation 11/22/2012  . Morbid obesity ([place]) 07/11/2012   . Trimalleolar fracture 07/07/2012  . CAD s/p LAD DES 2013, 2015 and 2017 04/08/2012  . ICM, EF 40-45% cath 04/07/12- improved to 50-60% by echo Oct 2013 04/08/2012  . Paroxysmal atrial fibrillation 04/07/2012  . NSTEMI (non-ST elevated myocardial infarction)- 7/13 04/07/2012  . Unstable angina ([place]) 04/07/2012  . PVD (peripheral vascular disease), Lt SFA PTA Aug 2012 04/07/2012  . Cancer of left lung parenchyma ([place]) 01/13/2009  . Insulin dependent diabetes mellitus ([place]) 11/27/2008  . Essential hypertension 11/27/2008    Orientation RESPIRATION BLADDER Height & Weight     Self, Time, Place, Situation  Normal Incontinent, External catheter Weight: 282 lb 13.6 oz (128.3 kg) Height:  5\' 3"  (160 cm)  BEHAVIORAL SYMPTOMS/MOOD NEUROLOGICAL BOWEL NUTRITION STATUS      Continent Diet(carb modified)  AMBULATORY STATUS COMMUNICATION OF NEEDS Skin   Extensive Assist Verbally Surgical wounds(chest wound silver hydrofiber dressing)                       Personal Care Assistance Level of Assistance  Bathing, Dressing Bathing Assistance: Maximum assistance   Dressing Assistance: Maximum assistance     Functional Limitations Info             SPECIAL CARE FACTORS FREQUENCY  PT (By licensed PT), OT (By licensed OT)     PT Frequency: 5/wk OT Frequency: 5/wk            Contractures      Additional Factors Info  Code Status, Allergies, Insulin Sliding Scale Code Status Info: FULL Allergies Info: Levofloxacin, Morphine And Related   Insulin Sliding Scale Info: 6/day  Current Medications (08/19/2017):  This is the current hospital active medication list Current Facility-Administered Medications  Medication Dose Route Frequency Provider Last Rate Last Dose  . acetaminophen (TYLENOL) tablet 1,000 mg  1,000 mg Oral Q6H Barrett, Erin R, PA-C   1,000 mg at 08/19/17 1159  . aspirin EC tablet 325 mg  325 mg Oral Daily Barrett, Erin R, PA-C   325 mg at 08/19/17 0859  .  atorvastatin (LIPITOR) tablet 40 mg  40 mg Oral q1800 Karmen Bongo, MD   40 mg at 08/18/17 1738  . bisacodyl (DULCOLAX) EC tablet 10 mg  10 mg Oral Daily Barrett, Erin R, PA-C   10 mg at 08/19/17 0858  . Chlorhexidine Gluconate Cloth 2 % PADS 6 each  6 each Topical Daily Melrose Nakayama, MD   6 each at 08/19/17 [phone]  . docusate sodium (COLACE) capsule 200 mg  200 mg Oral Daily Barrett, Erin R, PA-C   200 mg at 08/19/17 0857  . fentaNYL (SUBLIMAZE) injection 50-100 mcg  50-100 mcg Intravenous Q1H PRN Barrett, Erin R, PA-C   50 mcg at 08/19/17 0357  . furosemide (LASIX) tablet 40 mg  40 mg Oral Daily Melrose Nakayama, MD   40 mg at 08/19/17 0858  . glimepiride (AMARYL) tablet 2 mg  2 mg Oral Daily Melrose Nakayama, MD   2 mg at 08/19/17 0900  . insulin aspart (novoLOG) injection 0-20 Units  0-20 Units Subcutaneous TID WC Melrose Nakayama, MD   4 Units at 08/19/17 1159  . insulin aspart (novoLOG) injection 0-5 Units  0-5 Units Subcutaneous QHS Melrose Nakayama, MD      . insulin detemir (LEVEMIR) injection 20 Units  20 Units Subcutaneous BID Rexene Alberts, MD   20 Units at 08/19/17 0900  . lactated ringers infusion   Intravenous Continuous Barrett, Erin R, PA-C 20 mL/hr at 08/19/17 1400    . magnesium sulfate 4 GM/100ML IVPB           . metoprolol tartrate (LOPRESSOR) injection 2.5-5 mg  2.5-5 mg Intravenous Q2H PRN Barrett, Erin R, PA-C      . metoprolol tartrate (LOPRESSOR) tablet 25 mg  25 mg Oral BID Rexene Alberts, MD   25 mg at 08/19/17 0859  . ondansetron (ZOFRAN) injection 4 mg  4 mg Intravenous Q6H PRN Barrett, Erin R, PA-C   4 mg at 08/19/17 0347  . oxyCODONE (Oxy IR/ROXICODONE) immediate release tablet 5-10 mg  5-10 mg Oral Q3H PRN Barrett, Erin R, PA-C   10 mg at 08/19/17 0754  . pantoprazole (PROTONIX) EC tablet 40 mg  40 mg Oral Daily Barrett, Erin R, PA-C   40 mg at 08/19/17 0900  . potassium chloride (K-DUR) CR tablet 20 mEq  20 mEq Oral Daily  Melrose Nakayama, MD   20 mEq at 08/19/17 0900  . sodium chloride flush (NS) 0.9 % injection 3 mL  3 mL Intravenous Q12H Barrett, Erin R, PA-C   3 mL at 08/18/17 2106  . sodium chloride flush (NS) 0.9 % injection 3 mL  3 mL Intravenous PRN Barrett, Erin R, PA-C      . traMADol (ULTRAM) tablet 50-100 mg  50-100 mg Oral Q4H PRN Barrett, Erin R, PA-C      . warfarin (COUMADIN) tablet 5 mg  5 mg Oral ONCE-1800 Melrose Nakayama, MD      . Warfarin - Physician Dosing Inpatient   Does not apply [id number] Melrose Nakayama, MD  Discharge Medications: Please see discharge summary for a list of discharge medications.  Relevant Imaging Results:  Relevant Lab Results:   Additional Information SS: [id number]  Jorge Ny, LCSW

## 2017-08-19 NOTE — Plan of Care (Signed)
IV team unable to place midline IV, discussed with Dr. Roxan Hockey earlier about inserting PICC

## 2017-08-19 NOTE — Plan of Care (Signed)
  Progressing Clinical Measurements: Ability to maintain clinical measurements within normal limits will improve 08/19/2017 1015 - Progressing by Hillard Danker, RN Will remain free from infection 08/19/2017 1015 - Progressing by Hillard Danker, RN Diagnostic test results will improve 08/19/2017 1015 - Progressing by Hillard Danker, RN Respiratory complications will improve 08/19/2017 1015 - Progressing by Hillard Danker, RN Cardiovascular complication will be avoided 08/19/2017 1015 - Progressing by Hillard Danker, RN Elimination: Will not experience complications related to bowel motility 08/19/2017 1015 - Progressing by Hillard Danker, RN Will not experience complications related to urinary retention 08/19/2017 1015 - Progressing by Hillard Danker, RN Pain Managment: General experience of comfort will improve 08/19/2017 1015 - Progressing by Hillard Danker, RN Skin Integrity: Risk for impaired skin integrity will decrease 08/19/2017 1015 - Progressing by Hillard Danker, RN Spiritual Needs Ability to function at adequate level 08/19/2017 1015 - Progressing by Hillard Danker, RN Cardiac: Hemodynamic stability will improve 08/19/2017 1015 - Progressing by Hillard Danker, RN Respiratory: Respiratory status will improve 08/19/2017 1015 - Progressing by Hillard Danker, RN Urinary Elimination: Ability to achieve and maintain adequate renal perfusion and functioning will improve 08/19/2017 1015 - Progressing by Hillard Danker, RN

## 2017-08-19 NOTE — Progress Notes (Signed)
2 Days Post-Op Procedure(s) (LRB): CORONARY ARTERY BYPASS GRAFTING (CABG) TIMES 1 USING LEFT INTERNAL MAMMARY ARTERY (N/A) TRANSESOPHAGEAL ECHOCARDIOGRAM (TEE) (N/A) Subjective: Some nausea, "sore"  Objective: Vital signs in last 24 hours: Temp:  [98.2 F (36.8 C)-99.2 F (37.3 C)] 98.7 F (37.1 C) (11/23 0412) Pulse Rate:  [81-105] 105 (11/23 0700) Cardiac Rhythm: Normal sinus rhythm;Sinus tachycardia (11/23 0400) Resp:  [19-29] 23 (11/23 0700) BP: (110-135)/(41-68) 116/55 (11/23 0700) SpO2:  [92 %-100 %] 97 % (11/23 0700) Arterial Line BP: (107-163)/(45-55) 150/53 (11/22 1700) Weight:  [282 lb 13.6 oz (128.3 kg)] 282 lb 13.6 oz (128.3 kg) (11/23 0630)  Hemodynamic parameters for last 24 hours: PAP: (59-66)/(21-24) 59/21  Intake/Output from previous day: 11/22 0701 - 11/23 0700 In: 1161 [P.O.:360; I.V.:701; IV Piggyback:100] Out: 590 [Urine:530; Chest Tube:60] Intake/Output this shift: No intake/output data recorded.  General appearance: alert, cooperative and no distress Neurologic: intact Heart: tachy,regular Lungs: diminished breath sounds bibasilar Abdomen: normal findings: soft, non-tender  Lab Results: Recent Labs    08/18/17 1752 08/19/17 0200  WBC 11.3* 11.5*  HGB 8.4* 8.4*  HCT 25.8* 25.5*  PLT 152 147*   BMET:  Recent Labs    08/18/17 0446 08/18/17 1752 08/19/17 0200  NA 137  --  136  K 4.3  --  4.1  CL 109  --  107  CO2 22  --  26  GLUCOSE 120*  --  146*  BUN 11  --  14  CREATININE 1.04* 1.21* 1.22*  CALCIUM 8.6*  --  8.6*    PT/INR:  Recent Labs    08/17/17 1300  LABPROT 16.4*  INR 1.34   ABG    Component Value Date/Time   PHART 7.363 08/17/2017 1843   HCO3 23.7 08/17/2017 1843   TCO2 24 08/17/2017 1847   ACIDBASEDEF 2.0 08/17/2017 1843   O2SAT 100.0 08/17/2017 1843   CBG (last 3)  Recent Labs    08/18/17 2023 08/18/17 2359 08/19/17 0410  GLUCAP 167* 144* 151*    Assessment/Plan: S/P Procedure(s) (LRB): CORONARY  ARTERY BYPASS GRAFTING (CABG) TIMES 1 USING LEFT INTERNAL MAMMARY ARTERY (N/A) TRANSESOPHAGEAL ECHOCARDIOGRAM (TEE) (N/A) POD # 2 CABG x 1  CV- mildly tachy- lopressor dose increased yesterday- has not had it yet today  Warfarin- Was on 3.75 alternating with 7.5 mg at home, will start 5 mg daily  RESP_ atelectasis- IS, will add flutter  RENAL- creatinine stable  Weight up a little postop- will give PO lasix  Dc Foley  ENDO- CBG reasonably well controlled  Restart amaryl, change to AC/HS  Deconditioning and lack of mobility is a major issue. PT consulted  Limited IV access- will see if IV team can place a midline     LOS: 8 days    Melrose Nakayama 08/19/2017

## 2017-08-20 ENCOUNTER — Inpatient Hospital Stay (HOSPITAL_COMMUNITY): Payer: Medicare HMO

## 2017-08-20 DIAGNOSIS — Z951 Presence of aortocoronary bypass graft: Secondary | ICD-10-CM

## 2017-08-20 LAB — TYPE AND SCREEN
ABO/RH(D): A POS
Antibody Screen: NEGATIVE
UNIT DIVISION: 0
Unit division: 0
Unit division: 0
Unit division: 0

## 2017-08-20 LAB — BASIC METABOLIC PANEL
Anion gap: 6 (ref 5–15)
BUN: 23 mg/dL — AB (ref 6–20)
CHLORIDE: 107 mmol/L (ref 101–111)
CO2: 23 mmol/L (ref 22–32)
Calcium: 8.6 mg/dL — ABNORMAL LOW (ref 8.9–10.3)
Creatinine, Ser: 1.5 mg/dL — ABNORMAL HIGH (ref 0.44–1.00)
GFR calc non Af Amer: 35 mL/min — ABNORMAL LOW (ref >60)
GFR, EST AFRICAN AMERICAN: 40 mL/min — AB (ref >60)
GLUCOSE: 196 mg/dL — AB (ref 65–99)
POTASSIUM: 4.9 mmol/L (ref 3.5–5.1)
SODIUM: 136 mmol/L (ref 135–145)

## 2017-08-20 LAB — GLUCOSE, CAPILLARY
GLUCOSE-CAPILLARY: 145 mg/dL — AB (ref 65–99)
GLUCOSE-CAPILLARY: 172 mg/dL — AB (ref 65–99)
GLUCOSE-CAPILLARY: 115 mg/dL — AB (ref 65–99)
Glucose-Capillary: 200 mg/dL — ABNORMAL HIGH (ref 65–99)
Glucose-Capillary: 128 mg/dL — ABNORMAL HIGH (ref 65–99)
Glucose-Capillary: 162 mg/dL — ABNORMAL HIGH (ref 65–99)

## 2017-08-20 LAB — PROTIME-INR
INR: 1.24
Prothrombin Time: 15.5 seconds — ABNORMAL HIGH (ref 11.4–15.2)

## 2017-08-20 LAB — CBC
HEMATOCRIT: 26.2 % — AB (ref 36.0–46.0)
Hemoglobin: 8.5 g/dL — ABNORMAL LOW (ref 12.0–15.0)
MCH: 28.6 pg (ref 26.0–34.0)
MCHC: 32.4 g/dL (ref 30.0–36.0)
MCV: 88.2 fL (ref 78.0–100.0)
PLATELETS: 172 10*3/uL (ref 150–400)
RBC: 2.97 MIL/uL — ABNORMAL LOW (ref 3.87–5.11)
RDW: 14.6 % (ref 11.5–15.5)
WBC: 11.9 10*3/uL — AB (ref 4.0–10.5)

## 2017-08-20 LAB — BPAM RBC
Blood Product Expiration Date: 201812102359
Blood Product Expiration Date: 201812122359
Blood Product Expiration Date: 201812122359
Blood Product Expiration Date: 201812122359
ISSUE DATE / TIME: 201811210908
ISSUE DATE / TIME: 201811231020
UNIT TYPE AND RH: 6200
UNIT TYPE AND RH: 6200
UNIT TYPE AND RH: 6200
Unit Type and Rh: 6200

## 2017-08-20 MED ORDER — MOVING RIGHT ALONG BOOK
Freq: Once | Status: AC
  Administered 2017-08-20: 10:00:00
  Filled 2017-08-20: qty 1

## 2017-08-20 MED ORDER — ASPIRIN EC 81 MG PO TBEC
81.0000 mg | DELAYED_RELEASE_TABLET | Freq: Every day | ORAL | Status: DC
  Administered 2017-08-20 – 2017-08-23 (×4): 81 mg via ORAL
  Filled 2017-08-20 (×4): qty 1

## 2017-08-20 MED ORDER — SODIUM CHLORIDE 0.9 % IV SOLN: 250.0000 mL | INTRAVENOUS | Status: DC | PRN

## 2017-08-20 MED ORDER — SODIUM CHLORIDE 0.9% FLUSH
3.0000 mL | Freq: Two times a day (BID) | INTRAVENOUS | Status: DC
  Administered 2017-08-20 – 2017-08-21 (×3): 3 mL via INTRAVENOUS

## 2017-08-20 MED ORDER — WARFARIN SODIUM 5 MG PO TABS
5.0000 mg | ORAL_TABLET | Freq: Every day | ORAL | Status: DC
  Administered 2017-08-20 – 2017-08-23 (×4): 5 mg via ORAL
  Filled 2017-08-20 (×4): qty 1

## 2017-08-20 MED ORDER — SODIUM CHLORIDE 0.9% FLUSH: 3.0000 mL | INTRAVENOUS | Status: DC | PRN

## 2017-08-20 NOTE — Progress Notes (Signed)
      [address]       [place], [address]             [phone]        CARDIOTHORACIC SURGERY PROGRESS NOTE   R3 Days Post-Op Procedure(s) (LRB): CORONARY ARTERY BYPASS GRAFTING (CABG) TIMES 1 USING LEFT INTERNAL MAMMARY ARTERY (N/A) TRANSESOPHAGEAL ECHOCARDIOGRAM (TEE) (N/A)  Subjective: No complaints  Objective: Vital signs: BP Readings from Last 1 Encounters:  08/20/17 (!) 89/44   Pulse Readings from Last 1 Encounters:  08/20/17 96   Resp Readings from Last 1 Encounters:  08/20/17 (!) 23   Temp Readings from Last 1 Encounters:  08/20/17 98.5 F (36.9 C)    Hemodynamics:    Physical Exam:  Rhythm:   sinus  Breath sounds: clear  Heart sounds:  RRR  Incisions:  Dressing dry, intact  Abdomen:  Soft, non-distended, non-tender  Extremities:  Warm, well-perfused    Intake/Output from previous day: 11/23 0701 - 11/24 0700 In: 1430 [P.O.:900; I.V.:480; IV Piggyback:50] Out: 227 [Urine:226; Stool:1] Intake/Output this shift: Total I/O In: 20 [I.V.:20] Out: 0   Lab Results:  CBC: Recent Labs    08/19/17 0200 08/20/17 0403  WBC 11.5* 11.9*  HGB 8.4* 8.5*  HCT 25.5* 26.2*  PLT 147* 172    BMET:  Recent Labs    08/19/17 0200 08/20/17 0403  NA 136 136  K 4.1 4.9  CL 107 107  CO2 26 23  GLUCOSE 146* 196*  BUN 14 23*  CREATININE 1.22* 1.50*  CALCIUM 8.6* 8.6*     PT/INR:   Recent Labs    08/20/17 0403  LABPROT 15.5*  INR 1.24    CBG (last 3)  Recent Labs    08/19/17 1628 08/19/17 2018 08/20/17 0738  GLUCAP 190* 172* 200*    ABG    Component Value Date/Time   PHART 7.363 08/17/2017 1843   PCO2ART 41.2 08/17/2017 1843   PO2ART 195.0 (H) 08/17/2017 1843   HCO3 23.7 08/17/2017 1843   TCO2 24 08/17/2017 1847   ACIDBASEDEF 2.0 08/17/2017 1843   O2SAT 100.0 08/17/2017 1843    CXR: PORTABLE CHEST 1 VIEW  COMPARISON:  08/19/2017  FINDINGS: The patient is rotated to the left which limits assessment of  the mediastinal contours. Sequelae of CABG are again identified. A right jugular sheath remains in place. Pulmonary vascular congestion is again noted with worsening opacity in the right lung base. Left basilar opacity may have also mildly increased. There is likely a small persistent left pleural effusion. No pneumothorax is identified.  IMPRESSION: Increasing basilar predominant lung opacities which may reflect worsening edema and atelectasis.   Electronically Signed   By: Logan Bores M.D.   On: 08/20/2017 07:21   Assessment/Plan: S/P Procedure(s) (LRB): CORONARY ARTERY BYPASS GRAFTING (CABG) TIMES 1 USING LEFT INTERNAL MAMMARY ARTERY (N/A) TRANSESOPHAGEAL ECHOCARDIOGRAM (TEE) (N/A)  Doing well POD3 Maintaining NSR w/ stable BP Breathing comfortably on room air Will likely need short term SNF placement at D/C since patient does not have anyone who can help her at home PT to assist w/ mobility Transfer 4E Anticipate likely ready for D/C soon  Rexene Alberts, MD 08/20/2017 9:34 AM

## 2017-08-20 NOTE — Progress Notes (Signed)
Pacing wire removed per MD order. Patient tolerated very well vi   08/20/17 1515 08/20/17 1530 08/20/17 1545  Vitals  BP 130/60 113/63 123/60  MAP (mmHg) 77 77 79  Pulse Rate 88 88 91  ECG Heart Rate 89 86 89  Resp (!) 23 (!) 22 (!) 21  Oxygen Therapy  SpO2 100 % 99 % 100 %     08/20/17 1600 08/20/17 1615  Vitals  BP 122/65 127/60  MAP (mmHg) 79 74  Pulse Rate 87 88  ECG Heart Rate 87 87  Resp 19 18  Oxygen Therapy  SpO2 100 % 99 %  tal signs stable.

## 2017-08-21 LAB — CBC
HCT: 24.7 % — ABNORMAL LOW (ref 36.0–46.0)
Hemoglobin: 8.1 g/dL — ABNORMAL LOW (ref 12.0–15.0)
MCH: 28.9 pg (ref 26.0–34.0)
MCHC: 32.8 g/dL (ref 30.0–36.0)
MCV: 88.2 fL (ref 78.0–100.0)
PLATELETS: 197 10*3/uL (ref 150–400)
RBC: 2.8 MIL/uL — ABNORMAL LOW (ref 3.87–5.11)
RDW: 14.5 % (ref 11.5–15.5)
WBC: 10.3 10*3/uL (ref 4.0–10.5)

## 2017-08-21 LAB — BASIC METABOLIC PANEL
ANION GAP: 7 (ref 5–15)
BUN: 34 mg/dL — AB (ref 6–20)
CALCIUM: 8.6 mg/dL — AB (ref 8.9–10.3)
CO2: 23 mmol/L (ref 22–32)
CREATININE: 1.32 mg/dL — AB (ref 0.44–1.00)
Chloride: 105 mmol/L (ref 101–111)
GFR calc Af Amer: 47 mL/min — ABNORMAL LOW (ref >60)
GFR, EST NON AFRICAN AMERICAN: 41 mL/min — AB (ref >60)
GLUCOSE: 120 mg/dL — AB (ref 65–99)
Potassium: 4.7 mmol/L (ref 3.5–5.1)
Sodium: 135 mmol/L (ref 135–145)

## 2017-08-21 LAB — GLUCOSE, CAPILLARY
GLUCOSE-CAPILLARY: 246 mg/dL — AB (ref 65–99)
Glucose-Capillary: 96 mg/dL (ref 65–99)
Glucose-Capillary: 137 mg/dL — ABNORMAL HIGH (ref 65–99)

## 2017-08-21 LAB — PROTIME-INR
INR: 1.27
Prothrombin Time: 15.8 seconds — ABNORMAL HIGH (ref 11.4–15.2)

## 2017-08-21 MED ORDER — LISINOPRIL-HYDROCHLOROTHIAZIDE 20-25 MG PO TABS: 1.0000 | ORAL_TABLET | Freq: Every day | ORAL | Status: DC

## 2017-08-21 MED ORDER — SIMVASTATIN 40 MG PO TABS
40.0000 mg | ORAL_TABLET | Freq: Every day | ORAL | Status: DC
  Administered 2017-08-21 – 2017-08-22 (×2): 40 mg via ORAL
  Filled 2017-08-21 (×2): qty 1

## 2017-08-21 MED ORDER — HYDROCHLOROTHIAZIDE 25 MG PO TABS
25.0000 mg | ORAL_TABLET | Freq: Every day | ORAL | Status: DC
  Administered 2017-08-21 – 2017-08-23 (×3): 25 mg via ORAL
  Filled 2017-08-21 (×3): qty 1

## 2017-08-21 MED ORDER — LISINOPRIL 10 MG PO TABS
20.0000 mg | ORAL_TABLET | Freq: Every day | ORAL | Status: DC
  Administered 2017-08-21 – 2017-08-23 (×3): 20 mg via ORAL
  Filled 2017-08-21: qty 2
  Filled 2017-08-21: qty 1
  Filled 2017-08-21: qty 2

## 2017-08-21 MED ORDER — PANTOPRAZOLE SODIUM 40 MG PO TBEC
40.0000 mg | DELAYED_RELEASE_TABLET | Freq: Every day | ORAL | Status: DC
  Administered 2017-08-21 – 2017-08-23 (×3): 40 mg via ORAL
  Filled 2017-08-21 (×2): qty 1

## 2017-08-21 NOTE — Progress Notes (Signed)
Late entry Received patient in room 4E11  By chair @ 1:00pm      From Unit 2 Heart, alert and oriented, B/P  127/58  Placed on tele box 4E11  NSR,  O2 sats 100% on room air,  Complaints with surgical pain and oxycodone given.  Family in room with patient  At this time.  Mervyn Skeeters, RN

## 2017-08-21 NOTE — Progress Notes (Signed)
      [address]       [place], [address]             [phone]        CARDIOTHORACIC SURGERY PROGRESS NOTE   R4 Days Post-Op Procedure(s) (LRB): CORONARY ARTERY BYPASS GRAFTING (CABG) TIMES 1 USING LEFT INTERNAL MAMMARY ARTERY (N/A) TRANSESOPHAGEAL ECHOCARDIOGRAM (TEE) (N/A)  Subjective: No complaints except mild soreness in chest.  No SOB.  Good appetite.  No BM since surgery.  Nobody has come to take her for a walk in last 12 hours  Objective: Vital signs: BP Readings from Last 1 Encounters:  08/21/17 (!) 132/55   Pulse Readings from Last 1 Encounters:  08/21/17 94   Resp Readings from Last 1 Encounters:  08/21/17 (!) 23   Temp Readings from Last 1 Encounters:  08/20/17 98.5 F (36.9 C) (Oral)    Hemodynamics:    Physical Exam:  Rhythm:   sinus  Breath sounds: clear  Heart sounds:  RRR  Incisions:  Clean and dry  Abdomen:  Soft, non-distended, non-tender  Extremities:  Warm, well-perfused    Intake/Output from previous day: 11/24 0701 - 11/25 0700 In: 500 [P.O.:120; I.V.:380] Out: 450 [Urine:450] Intake/Output this shift: No intake/output data recorded.  Lab Results:  CBC: Recent Labs    08/20/17 0403 08/21/17 0409  WBC 11.9* 10.3  HGB 8.5* 8.1*  HCT 26.2* 24.7*  PLT 172 197    BMET:  Recent Labs    08/20/17 0403 08/21/17 0409  NA 136 135  K 4.9 4.7  CL 107 105  CO2 23 23  GLUCOSE 196* 120*  BUN 23* 34*  CREATININE 1.50* 1.32*  CALCIUM 8.6* 8.6*     PT/INR:   Recent Labs    08/21/17 0409  LABPROT 15.8*  INR 1.27    CBG (last 3)  Recent Labs    08/20/17 2034 08/20/17 2144 08/21/17 0823  GLUCAP 128* 162* 96    ABG    Component Value Date/Time   PHART 7.363 08/17/2017 1843   PCO2ART 41.2 08/17/2017 1843   PO2ART 195.0 (H) 08/17/2017 1843   HCO3 23.7 08/17/2017 1843   TCO2 24 08/17/2017 1847   ACIDBASEDEF 2.0 08/17/2017 1843   O2SAT 100.0 08/17/2017 1843     CXR: n/a  Assessment/Plan: S/P Procedure(s) (LRB): CORONARY ARTERY BYPASS GRAFTING (CABG) TIMES 1 USING LEFT INTERNAL MAMMARY ARTERY (N/A) TRANSESOPHAGEAL ECHOCARDIOGRAM (TEE) (N/A)  Clinically stable Waiting for bed to transfer Needs to ambulate Will need SNF placement for D/C Potentially could be ready for D/C tomorrow but patient remains in ICU and has not had any D/C planning or education  Rexene Alberts, MD 08/21/2017 10:06 AM

## 2017-08-22 ENCOUNTER — Other Ambulatory Visit: Payer: Self-pay | Admitting: *Deleted

## 2017-08-22 ENCOUNTER — Other Ambulatory Visit: Payer: Self-pay | Admitting: Pharmacist Clinician (PhC)/ Clinical Pharmacy Specialist

## 2017-08-22 LAB — PROTIME-INR
INR: 1.24
PROTHROMBIN TIME: 15.5 s — AB (ref 11.4–15.2)

## 2017-08-22 LAB — GLUCOSE, CAPILLARY
GLUCOSE-CAPILLARY: 76 mg/dL (ref 65–99)
GLUCOSE-CAPILLARY: 95 mg/dL (ref 65–99)
GLUCOSE-CAPILLARY: 108 mg/dL — AB (ref 65–99)
Glucose-Capillary: 178 mg/dL — ABNORMAL HIGH (ref 65–99)
Glucose-Capillary: 97 mg/dL (ref 65–99)

## 2017-08-22 MED ORDER — SIMVASTATIN 40 MG PO TABS: 40.0000 mg | ORAL_TABLET | Freq: Every day | ORAL | 0 refills | Status: DC

## 2017-08-22 MED ORDER — WARFARIN SODIUM 5 MG PO TABS: ORAL_TABLET | ORAL | 1 refills | Status: DC

## 2017-08-22 MED FILL — Magnesium Sulfate Inj 50%: INTRAMUSCULAR | Qty: 10 | Status: AC

## 2017-08-22 MED FILL — Mannitol IV Soln 20%: INTRAVENOUS | Qty: 500 | Status: AC

## 2017-08-22 MED FILL — Potassium Chloride Inj 2 mEq/ML: INTRAVENOUS | Qty: 20 | Status: AC

## 2017-08-22 MED FILL — Sodium Chloride IV Soln 0.9%: INTRAVENOUS | Qty: 2000 | Status: AC

## 2017-08-22 MED FILL — Heparin Sodium (Porcine) Inj 1000 Unit/ML: INTRAMUSCULAR | Qty: 30 | Status: AC

## 2017-08-22 MED FILL — Heparin Sodium (Porcine) Inj 1000 Unit/ML: INTRAMUSCULAR | Qty: 10 | Status: AC

## 2017-08-22 MED FILL — Sodium Bicarbonate IV Soln 8.4%: INTRAVENOUS | Qty: 50 | Status: AC

## 2017-08-22 MED FILL — Heparin Sodium (Porcine) Inj 1000 Unit/ML: INTRAMUSCULAR | Qty: 2500 | Status: AC

## 2017-08-22 MED FILL — Electrolyte-R (PH 7.4) Solution: INTRAVENOUS | Qty: 3000 | Status: AC

## 2017-08-22 MED FILL — Dexmedetomidine HCl in NaCl 0.9% IV Soln 400 MCG/100ML: INTRAVENOUS | Qty: 100 | Status: AC

## 2017-08-22 MED FILL — Cefuroxime Sodium For Inj 750 MG: INTRAMUSCULAR | Qty: 1 | Status: AC

## 2017-08-22 MED FILL — Lidocaine HCl IV Inj 20 MG/ML: INTRAVENOUS | Qty: 5 | Status: AC

## 2017-08-22 NOTE — Care Management Important Message (Signed)
Important Message  Patient Details  Name: [name] MRN: [id number] Date of Birth: [birth year]   Medicare Important Message Given:  Yes    Mikko Lewellen Abena 08/22/2017, 10:04 AM

## 2017-08-22 NOTE — Progress Notes (Addendum)
Patient without pacing wires, per patient/ chart they were removed on Saturday 11/24 .  will monitor patient. Sybel Standish, Bettina Gavia rN

## 2017-08-22 NOTE — Progress Notes (Signed)
CARDIAC REHAB PHASE I   PRE:  Rate/Rhythm: 86 SR  BP:  Supine:   Sitting: 111/69  Standing:    SaO2: 100%RA  MODE:  Ambulation: BSC ft   POST:  Rate/Rhythm: 101 ST  BP:  Supine:   Sitting: 105/62  Standing:    SaO2: 97%RA 1318-1402 Pt encouraged to walk. Pt willing. Pt had purewick but it had dislodged and pt's gown and legs wet. Helped pt get cleaned up and to [facility]. Pt exhausted after using BSC and getting cleaned up. She c/o thigh hurting. We assisted back to recliner and notified RN that [place] out. We stood twice which took much effort and pt afraid she is going to fall.  Call light within reach. Encouraged IS.   Graylon Good, RN BSN  08/22/2017 1:55 PM

## 2017-08-22 NOTE — Progress Notes (Signed)
Ed completed. Will refer pt to [place]. Voiced understanding.  [place] CES, ACSM 3:37 PM 08/22/2017

## 2017-08-22 NOTE — Progress Notes (Addendum)
      [address]       [place] [address]             [phone]      5 Days Post-Op Procedure(s) (LRB): CORONARY ARTERY BYPASS GRAFTING (CABG) TIMES 1 USING LEFT INTERNAL MAMMARY ARTERY (N/A) TRANSESOPHAGEAL ECHOCARDIOGRAM (TEE) (N/A)   Subjective:  No new complaints.  Continues to have pain at incision site.  No BM yet  Objective: Vital signs in last 24 hours: Temp:  [98.4 F (36.9 C)-98.8 F (37.1 C)] 98.6 F (37 C) (11/26 0505) Pulse Rate:  [88-100] 89 (11/26 0505) Cardiac Rhythm: Normal sinus rhythm;Bundle branch block (11/26 0700) Resp:  [21-28] 21 (11/26 0505) BP: (112-157)/(54-58) 112/54 (11/26 0505) SpO2:  [98 %-100 %] 99 % (11/26 0505)  Intake/Output from previous day: 11/25 0701 - 11/26 0700 In: 560 [P.O.:480; I.V.:80] Out: 175 [Urine:175]  General appearance: alert, cooperative and no distress Heart: regular rate and rhythm Lungs: clear to auscultation bilaterally Abdomen: soft, non-tender; bowel sounds normal; no masses,  no organomegaly Extremities: edema trace Wound: clean and dry  Lab Results: Recent Labs    08/20/17 0403 08/21/17 0409  WBC 11.9* 10.3  HGB 8.5* 8.1*  HCT 26.2* 24.7*  PLT 172 197   BMET:  Recent Labs    08/20/17 0403 08/21/17 0409  NA 136 135  K 4.9 4.7  CL 107 105  CO2 23 23  GLUCOSE 196* 120*  BUN 23* 34*  CREATININE 1.50* 1.32*  CALCIUM 8.6* 8.6*    PT/INR:  Recent Labs    08/21/17 0409  LABPROT 15.8*  INR 1.27   ABG    Component Value Date/Time   PHART 7.363 08/17/2017 1843   HCO3 23.7 08/17/2017 1843   TCO2 24 08/17/2017 1847   ACIDBASEDEF 2.0 08/17/2017 1843   O2SAT 100.0 08/17/2017 1843   CBG (last 3)  Recent Labs    08/21/17 1622 08/21/17 2218 08/22/17 0601  GLUCAP 246* 137* 76    Assessment/Plan: S/P Procedure(s) (LRB): CORONARY ARTERY BYPASS GRAFTING (CABG) TIMES 1 USING LEFT INTERNAL MAMMARY ARTERY (N/A) TRANSESOPHAGEAL ECHOCARDIOGRAM (TEE) (N/A)  1. CV- Sinus  Tach, BP ranges from 110s-150s- continue Lopressor, Lisinopril/HCTZ...  2. Pulm- no acute issues, off oxygen, continue IS 3. INR- not checked today, will put orders in continue coumadin per home dosing 4. Renal- creatinine stable, weight is inaccurate today, minimal lower extremity edema, continue Lasix 5. DM- sugars are mostly controlled, isolated sugar above 200, continue current regimen 6. Deconditioning- patient needs SNF 7. Dispo- patient stable, d/c EPW today, continue coumadin per home regimen, to SNF once bed available   LOS: 11 days    Ellwood Handler 08/22/2017 Patient seen and examined, agree with above Ready for SNF when bed available  Remo Lipps C. Roxan Hockey, MD [facility] [phone]

## 2017-08-22 NOTE — Care Management Note (Signed)
Case Management Note Marvetta Gibbons RN, BSN Unit 4E-Case Manager [phone]  Patient Details  Name: JAIRA CANADY MRN: [id number] Date of Birth: Apr 30, 1951  Subjective/Objective:   Pt admitted with chest pain-  s/p CABGx1 on 11/21                Action/Plan: PTA pt lived at home- per PT eval SNF recommended- CSW consulted for SNF placement pending insurance auth.   Expected Discharge Date:                  Expected Discharge Plan:  Skilled Nursing Facility  In-House Referral:  Clinical Social Work  Discharge planning Services  CM Consult  Post Acute Care Choice:    Choice offered to:     DME Arranged:    DME Agency:     HH Arranged:    [place] Agency:     Status of Service:  In process, will continue to follow  If discussed at Long Length of Stay Meetings, dates discussed:    Discharge Disposition: skilled facility   Additional Comments:  Dawayne Patricia, RN 08/22/2017, 11:44 AM

## 2017-08-22 NOTE — Discharge Summary (Signed)
Physician Discharge Summary  Patient ID: [name] MRN: [id number] DOB/AGE: [birth year] 66 y.o.  Admit date: 08/09/2017 Discharge date: 08/23/2017  Admission Diagnoses:  Discharge Diagnoses:  Principal Problem:   Chest pain Active Problems:   Essential hypertension   Paroxysmal atrial fibrillation   Unstable angina (HCC)   CAD s/p LAD DES 2013, 2015 and 2017   Dyslipidemia   Chronic anticoagulation   DM (diabetes mellitus), type 2 with peripheral vascular complications (HCC)   CKD (chronic kidney disease) stage 3, GFR 30-59 ml/min (HCC)   S/P CABG x 1  Patient Active Problem List   Diagnosis Date Noted  . S/P CABG x 1 08/17/2017  . Abnormal nuclear stress test 02/05/2016  . Chest pain with high risk for cardiac etiology 02/05/2016  . CAD S/P percutaneous coronary angioplasty 02/05/2016  . GERD (gastroesophageal reflux disease) 01/06/2016  . CKD (chronic kidney disease) stage 3, GFR 30-59 ml/min (HCC) 01/06/2016  . Chronic combined systolic and diastolic heart failure ([place]) 06/28/2015  . Esophageal reflux 02/15/2014  . Anemia 01/17/2014  . S/P left TK revision 12/31/2013  . Knee osteomyelitis, left ([place]) 11/21/2013  . Foreign body of knee with infection 11/18/2013  . Unspecified constipation 11/07/2013  . Expected blood loss anemia 11/06/2013  . Left knee spacer, following removal of joint prosthesis 11/05/2013  . DM (diabetes mellitus), type 2 with peripheral vascular complications ([place]) 82/70/7867  . Onychomycosis 10/23/2013  . Pain, foot 10/23/2013  . DJD (degenerative joint disease) of knee 04/10/2013  . Vaginal atrophy 01/01/2013  . OAB (overactive bladder) 01/01/2013  . Osteopenia 01/01/2013  . Chest pain 11/28/2012  . Palpitations 11/22/2012  . Dyslipidemia 11/22/2012  . Chronic anticoagulation 11/22/2012  . Morbid obesity ([place]) 07/11/2012  . Trimalleolar fracture 07/07/2012  . CAD s/p LAD DES 2013, 2015 and 2017 04/08/2012  . ICM, EF 40-45% cath  04/07/12- improved to 50-60% by echo Oct 2013 04/08/2012  . Paroxysmal atrial fibrillation 04/07/2012  . NSTEMI (non-ST elevated myocardial infarction)- 7/13 04/07/2012  . Unstable angina ([place]) 04/07/2012  . PVD (peripheral vascular disease), Lt SFA PTA Aug 2012 04/07/2012  . Cancer of left lung parenchyma ([place]) 01/13/2009  . Insulin dependent diabetes mellitus ([place]) 11/27/2008  . Essential hypertension 11/27/2008   History of Present Illness:    at time of consultation: Brandi A Farringtonis a 66 y.o.femalewith medical history significant ofIDDM; OSA not on CPAP; CAD; PVD; afib on Coumadin; remote lung cancer; HTN; and HLD presenting with chest pain starting atabout 12pm today. She was sitting down when it started. Substernal in nature. The pain lasted about 2 minutes. It resolved spontaneously. After that she felt like she indigestion. No SOB, diaphoresis. She has had similar symptomsbefore when she had restenosed artery. Last saw cardiology in May of this year.  Last cath prior to this admission was 2017 -Ost - Proximal LAD lesion, 80% and 99% in-stent restenosis. Post intervention with scoring balloon angioplasty followed by a Synergy DES stent covering the entire stented segment., there is a 0% residual stenosis. The lesion was previously treated with a 2 overlapping DES stents in 2013 and 2015. -Prox Cx lesion, 50% stenosed. Stable -[place] lesion, 20% stenosed. -The left ventricular systolic function was normal  The patient has been on long-term Plavix.  She has had LAD procedures in 2013, 2015, 2017 and now returns with 90% in-stent stenosis.  With injected nitroglycerin the distal LAD shows adequate size and improved filling.  In 2010 the patient had left thoracotomy and wedge resection  of lingular and left lower lobe small adenocarcinoma stage I which further therapy was not performed.  Lymph nodes were negative.  Last CT scan of chest was 2017 without evidence of  recurrence, no abnormal mediastinal adenopathy, with post surgical changes at the left lung base  Patient has had left total knee replacement with several revisions and uses a rolling walker to get around her home.  Echocardiogram is pending but LV function is normal by ventriculogram and LVEDP is 14  PFTs have been performed showing adequate mechanics and diffusion capacity for sternotomy.  Room air PO2 60, PCO2 41, pH 7.4  Initial P2 Y 12 assay is 216, hemoglobin A1c 7.9, creatinine 0.9, hemoglobin 10.8   Discharged Condition: good  Hospital Course: The patient presented to the emergency department with chest pain and was felt to require admission for further evaluation and treatment due to her significant previous history of coronary artery disease with previous stents.  Cardiac catheterization was done on 08/11/2017.    Prox Cx lesion is 50% stenosed.  Mid RCA lesion is 25% stenosed.  Ost LAD to Prox LAD lesion is 90% stenosed.  The left ventricular systolic function is normal.  LV end diastolic pressure is normal.  The left ventricular ejection fraction is 55-65% by visual estimate.   1. Single vessel obstructive CAD with severe in stent restenosis in the proximal LAD 2. Normal LV function 3. Normal LVEDPThis revealed the following:  Due to these findings cardiothoracic surgical consultation was obtained and recommendations were made for coronary artery bypass grafting. A plavix washout period was recommended.  The patient remained medically stable.  On 08/17/2017 the patient was taken to the operating room where she underwent the below described procedure by Dr. Roxan Hockey.  She tolerated it well and was taken to the surgical intensive care unit in stable condition.  Postoperative hospital course:  Overall the patient has progressed nicely.  He has remained hemodynamically stable in normal sinus rhythm.  He was weaned from the ventilator without difficulties using  standard protocols.  All routine lines, monitors and drainage devices have been discontinued in the standard fashion. The patient is a poorly controlled diabetic blood sugars have been managed aggressively with usual protocols initially with the [place] with transition to sliding scale and insulin dosing.  The patient has been restarted on Coumadin.  PT and INR have been monitored daily. PT and INR as of 08/23/2017 are 15.4 and 1.23. She will be sent on Coumadin 5 mg daily and her PT and INR need to be checked by the facility on Thursday 08/25/2017. Oxygen has been weaned and she maintains good saturations on room air.  Physical therapy is assisting with activity progression and she will require skilled nursing facility at time of discharge for ongoing rehabilitation.  Patient does have some postoperative volume overload but is responding well to diuretics.  Most recent creatinine is 1.32 with a BUN of 34 on 08/21/2017. Her potassium on this date is 4.7 and she has been given oral potassium. I will not give her a potassium supplementation,but recommend the SNF check a potassium in a few days and if it is 4 or less, would give potassium 20 meq daily and recheck.  All incisions are noted to be healing well without she is tolerating diet.  Per Jadene Pierini PA-C and Dr. Roxan Hockey,  the patient is felt to be stable for discharge to SNF today.   Consults: cardiology  Significant Diagnostic Studies: angiography:   Treatments: surgery:  DATE OF PROCEDURE:  08/17/2017 DATE OF DISCHARGE:                              OPERATIVE REPORT   PREOPERATIVE DIAGNOSIS:  Severe single-vessel coronary artery disease with in-stent restenosis.  POSTOPERATIVE DIAGNOSIS:  Severe single-vessel coronary artery disease with in-stent restenosis.  PROCEDURES:  Median sternotomy, extracorporeal circulation, coronary artery bypass grafting x1 with left internal mammary artery to LAD.  SURGEON:  Revonda Standard.  Roxan Hockey, MD.  ASSISTANTEllwood Handler, PA.  ANESTHESIA:  General.   Discharge Exam: Blood pressure (!) 119/58, pulse 97, temperature 99.1 F (37.3 C), temperature source Oral, resp. rate (!) 23, height 5\' 3"  (1.6 m), weight 253 lb 8 oz (115 kg), SpO2 100 %.  General appearance: alert, cooperative and no distress Heart: regular rate and rhythm Lungs: clear to auscultation bilaterally Abdomen: benign Extremities: + edema Wound: incis healing well   Disposition: To SNF  Discharge Instructions    Amb Referral to Cardiac Rehabilitation   Complete by:  As directed    Diagnosis:  CABG   CABG X ___:  1     Allergies as of 08/23/2017      Reactions   Levofloxacin Itching   Morphine And Related Itching      Medication List    STOP taking these medications   clopidogrel 75 MG tablet Commonly known as:  PLAVIX     TAKE these medications   aspirin 81 MG EC tablet Take 1 tablet (81 mg total) by mouth daily. Start taking on:  08/24/2017   cholecalciferol 1000 units tablet Commonly known as:  VITAMIN D Take 1,000 Units by mouth at bedtime.   furosemide 40 MG tablet Commonly known as:  LASIX Take 1 tablet (40 mg total) by mouth daily. Start taking on:  08/24/2017   glimepiride 2 MG tablet Commonly known as:  AMARYL Take 2 mg daily by mouth.   insulin NPH-regular Human (70-30) 100 UNIT/ML injection Commonly known as:  NOVOLIN 70/30 Inject 30-34 Units into the skin 2 (two) times daily with a meal. Prior to surgery, she took 42 units in the morning 20 units in the evening. As of 08/23/2017, she is on 20 units in am and 20 units in the pm. Her Insulin gradually needs to be increased to pre surgery doses as glucose levels allow. What changed:  additional instructions   lisinopril-hydrochlorothiazide 20-25 MG tablet Commonly known as:  PRINZIDE,ZESTORETIC Take 1 tablet by mouth daily.   metoprolol tartrate 25 MG tablet Commonly known as:  LOPRESSOR Take 1 tablet  (25 mg total) by mouth 2 (two) times daily.   oxyCODONE 5 MG immediate release tablet Commonly known as:  Oxy IR/ROXICODONE Take 5 mg by mouth every 4-6 hours PRN severe pain   pantoprazole 40 MG tablet Commonly known as:  PROTONIX TAKE 1 TABLET BY MOUTH EVERY DAY   simvastatin 40 MG tablet Commonly known as:  ZOCOR Take 1 tablet (40 mg total) by mouth at bedtime. What changed:    how much to take  how to take this  when to take this   warfarin 5 MG tablet Commonly known as:  COUMADIN Take as directed. If you are unsure how to take this medication, talk to your nurse or doctor. Original instructions:  Take 5 mg by mouth or as directed.     The patient has been discharged on:   1.Beta Blocker:  Yes [  y  ]                              No   [   ]                              If No, reason:  2.Ace Inhibitor/ARB: Yes [ y  ]                                     No  [    ]                                     If No, reason:  3.Statin:   Yes Blue.Reese   ]                  No  [   ]                  If No, reason:  4.Ecasa:  Yes  Blue.Reese   ]                  No   [   ]                  If No, reason:   Follow-up Information    Melrose Nakayama, MD. Go to.   Specialty:  Cardiothoracic Surgery Why:  Appointment to see Dr. Roxan Hockey on 09/13/2017 at 11 AM.  Please obtain a chest x-ray at [facility] at 10:30 AM.  [facility] imaging is located in the same office complex on the first floor. Contact information: [place] [address] [phone]        SNF Follow up.   Why:  Please draw a PT and INR on THURSDAY 08/25/2017 and call or fax results to Dr. Victorino December office. INR goal 2-2.5       Barrett, Evelene Croon, PA-C. Go on 09/05/2017.   Specialties:  Cardiology, Radiology Why:  Appointment time is at 1:30 pm Contact information: [address] [place] [address] [phone]        [name], MD. Call.    Specialty:  Internal Medicine Why:  for a follow up appointment regarding further diabetes management and HGA1C 7.9 Contact information: [address] [place] [address] [phone]            Signed: Nani Skillern PA-C 08/23/2017, 4:28 PM

## 2017-08-22 NOTE — Progress Notes (Signed)
Physical Therapy Treatment Patient Details Name: [name] MRN: [id number] DOB: [birth year] Today's Date: 08/22/2017    History of Present Illness Pt is a 66 y.o. female with medical history significant of IDDM; OSA not on CPAP; CAD; PVD; afib on Coumadin; remote lung cancer; HTN; HLD; and s/p L TKA. She was admitted with chest pain. Pt underwent CABG 08-17-17.      PT Comments    Pt making slow, steady progress. Transition from sit to stand very difficult do to body habitus.   Follow Up Recommendations  SNF     Equipment Recommendations  Rolling walker with 5" wheels    Recommendations for Other Services       Precautions / Restrictions Precautions Precautions: Sternal    Mobility  Bed Mobility               General bed mobility comments: Pt sitting up on EOB  Transfers Overall transfer level: Needs assistance Equipment used: Rolling walker (2 wheeled) Transfers: Sit to/from Stand Sit to Stand: +2 physical assistance;Mod assist         General transfer comment: Assist to bring hips up using bed pad under hips. Pt comes up flexed at the hips to almost 90 degrees and then brings trunk more upright.  Ambulation/Gait Ambulation/Gait assistance: Min assist;+2 safety/equipment Ambulation Distance (Feet): 40 Feet(10' x 1, 40' x 1) Assistive device: Rolling walker (2 wheeled) Gait Pattern/deviations: Decreased step length - right;Decreased step length - left;Shuffle;Trunk flexed;Step-through pattern;Wide base of support Gait velocity: decr Gait velocity interpretation: Below normal speed for age/gender General Gait Details: Assist for balance and support. Close chair follow. Pt in flexed posture leaning over walker. Verbal cues to stand more erect. HR to 120's.    Stairs            Wheelchair Mobility    Modified Rankin (Stroke Patients Only)       Balance Overall balance assessment: Needs assistance Sitting-balance support: Feet supported;No  upper extremity supported Sitting balance-Leahy Scale: Fair     Standing balance support: Bilateral upper extremity supported Standing balance-Leahy Scale: Poor Standing balance comment: walker and min assist for static standing                            Cognition Arousal/Alertness: Awake/alert Behavior During Therapy: WFL for tasks assessed/performed Overall Cognitive Status: Within Functional Limits for tasks assessed                                        Exercises      General Comments        Pertinent Vitals/Pain Pain Assessment: Faces Faces Pain Scale: Hurts even more Pain Location: incisional and kneees Pain Descriptors / Indicators: Grimacing;Guarding Pain Intervention(s): Limited activity within patient's tolerance;Monitored during session    Home Living                      Prior Function            PT Goals (current goals can now be found in the care plan section) Progress towards PT goals: Progressing toward goals    Frequency    Min 3X/week      PT Plan Current plan remains appropriate    Co-evaluation              AM-PAC PT "6 Clicks"  Daily Activity  Outcome Measure  Difficulty turning over in bed (including adjusting bedclothes, sheets and blankets)?: Unable Difficulty moving from lying on back to sitting on the side of the bed? : Unable Difficulty sitting down on and standing up from a chair with arms (e.g., wheelchair, bedside commode, etc,.)?: Unable Help needed moving to and from a bed to chair (including a wheelchair)?: A Lot Help needed walking in hospital room?: A Lot Help needed climbing 3-5 steps with a railing? : Total 6 Click Score: 8    End of Session Equipment Utilized During Treatment: Gait belt Activity Tolerance: Patient limited by fatigue Patient left: with call bell/phone within reach;in chair Nurse Communication: Mobility status PT Visit Diagnosis: Other abnormalities of  gait and mobility (R26.89);Muscle weakness (generalized) (M62.81);Difficulty in walking, not elsewhere classified (R26.2);Pain Pain - Right/Left: Left Pain - part of body: Knee(chest incision)     Time: 9485-4627 PT Time Calculation (min) (ACUTE ONLY): 25 min  Charges:  $Gait Training: 23-37 mins                    G Codes:       [facility] PT [place] 08/22/2017, 10:05 AM

## 2017-08-23 ENCOUNTER — Other Ambulatory Visit: Payer: Self-pay | Admitting: Cardiovascular Disease

## 2017-08-23 DIAGNOSIS — I5033 Acute on chronic diastolic (congestive) heart failure: Secondary | ICD-10-CM | POA: Diagnosis not present

## 2017-08-23 DIAGNOSIS — Z951 Presence of aortocoronary bypass graft: Secondary | ICD-10-CM | POA: Diagnosis not present

## 2017-08-23 DIAGNOSIS — I200 Unstable angina: Secondary | ICD-10-CM | POA: Diagnosis not present

## 2017-08-23 DIAGNOSIS — E785 Hyperlipidemia, unspecified: Secondary | ICD-10-CM | POA: Diagnosis not present

## 2017-08-23 DIAGNOSIS — I10 Essential (primary) hypertension: Secondary | ICD-10-CM | POA: Diagnosis not present

## 2017-08-23 DIAGNOSIS — I4891 Unspecified atrial fibrillation: Secondary | ICD-10-CM | POA: Diagnosis not present

## 2017-08-23 DIAGNOSIS — E119 Type 2 diabetes mellitus without complications: Secondary | ICD-10-CM | POA: Diagnosis not present

## 2017-08-23 DIAGNOSIS — I2510 Atherosclerotic heart disease of native coronary artery without angina pectoris: Secondary | ICD-10-CM | POA: Diagnosis not present

## 2017-08-23 DIAGNOSIS — S299XXA Unspecified injury of thorax, initial encounter: Secondary | ICD-10-CM | POA: Diagnosis not present

## 2017-08-23 DIAGNOSIS — I480 Paroxysmal atrial fibrillation: Secondary | ICD-10-CM | POA: Diagnosis not present

## 2017-08-23 DIAGNOSIS — Z79899 Other long term (current) drug therapy: Secondary | ICD-10-CM | POA: Diagnosis not present

## 2017-08-23 DIAGNOSIS — M6281 Muscle weakness (generalized): Secondary | ICD-10-CM | POA: Diagnosis not present

## 2017-08-23 DIAGNOSIS — R262 Difficulty in walking, not elsewhere classified: Secondary | ICD-10-CM | POA: Diagnosis not present

## 2017-08-23 DIAGNOSIS — R079 Chest pain, unspecified: Secondary | ICD-10-CM | POA: Diagnosis not present

## 2017-08-23 DIAGNOSIS — Z48812 Encounter for surgical aftercare following surgery on the circulatory system: Secondary | ICD-10-CM | POA: Diagnosis not present

## 2017-08-23 DIAGNOSIS — Y840 Cardiac catheterization as the cause of abnormal reaction of the patient, or of later complication, without mention of misadventure at the time of the procedure: Secondary | ICD-10-CM | POA: Diagnosis not present

## 2017-08-23 DIAGNOSIS — R41841 Cognitive communication deficit: Secondary | ICD-10-CM | POA: Diagnosis not present

## 2017-08-23 LAB — GLUCOSE, CAPILLARY
GLUCOSE-CAPILLARY: 170 mg/dL — AB (ref 65–99)
GLUCOSE-CAPILLARY: 117 mg/dL — AB (ref 65–99)
Glucose-Capillary: 83 mg/dL (ref 65–99)

## 2017-08-23 LAB — PROTIME-INR
INR: 1.23
Prothrombin Time: 15.4 seconds — ABNORMAL HIGH (ref 11.4–15.2)

## 2017-08-23 MED ORDER — WARFARIN SODIUM 5 MG PO TABS: ORAL_TABLET | ORAL | 1 refills | Status: DC

## 2017-08-23 MED ORDER — ASPIRIN 81 MG PO TBEC: 81.0000 mg | DELAYED_RELEASE_TABLET | Freq: Every day | ORAL | Status: DC

## 2017-08-23 MED ORDER — OXYCODONE HCL 5 MG PO TABS: ORAL_TABLET | ORAL | 0 refills | Status: DC

## 2017-08-23 MED ORDER — FUROSEMIDE 40 MG PO TABS: 40.0000 mg | ORAL_TABLET | Freq: Every day | ORAL | Status: DC

## 2017-08-23 MED ORDER — INSULIN NPH ISOPHANE & REGULAR (70-30) 100 UNIT/ML ~~LOC~~ SUSP: 30.0000 [IU] | Freq: Two times a day (BID) | SUBCUTANEOUS | 11 refills | Status: DC

## 2017-08-23 NOTE — Progress Notes (Signed)
Pt discharged with all her belongings via [facility]. AVS given and report called to Facility by day shift nurse. Isac Caddy. RN

## 2017-08-23 NOTE — Discharge Instructions (Signed)
Coronary Artery Bypass Grafting, Care After °These instructions give you information on caring for yourself after your procedure. Your doctor may also give you more specific instructions. Call your doctor if you have any problems or questions after your procedure. °Follow these instructions at home: °· Only take medicine as told by your doctor. Take medicines exactly as told. Do not stop taking medicines or start any new medicines without talking to your doctor first. °· Take your pulse as told by your doctor. °· Do deep breathing as told by your doctor. Use your breathing device (incentive spirometer), if given, to practice deep breathing several times a day. Support your chest with a pillow or your arms when you take deep breaths or cough. °· Keep the area clean, dry, and protected where the surgery cuts (incisions) were made. Remove bandages (dressings) only as told by your doctor. If strips were applied to surgical area, do not take them off. They fall off on their own. °· Check the surgery area daily for puffiness (swelling), redness, or leaking fluid. °· If surgery cuts were made in your legs: °? Avoid crossing your legs. °? Avoid sitting for long periods of time. Change positions every 30 minutes. °? Raise your legs when you are sitting. Place them on pillows. °· Wear stockings that help keep blood clots from forming in your legs (compression stockings). °· Only take sponge baths until your doctor says it is okay to take showers. Pat the surgery area dry. Do not rub the surgery area with a washcloth or towel. Do not bathe, swim, or use a hot tub until your doctor says it is okay. °· Eat foods that are high in fiber. These include raw fruits and vegetables, whole grains, beans, and nuts. Choose lean meats. Avoid canned, processed, and fried foods. °· Drink enough fluids to keep your pee (urine) clear or pale yellow. °· Weigh yourself every day. °· Rest and limit activity as told by your doctor. You may be told  to: °? Stop any activity if you have chest pain, shortness of breath, changes in heartbeat, or dizziness. Get help right away if this happens. °? Move around often for short amounts of time or take short walks as told by your doctor. Gradually become more active. You may need help to strengthen your muscles and build endurance. °? Avoid lifting, pushing, or pulling anything heavier than 10 pounds (4.5 kg) for at least 6 weeks after surgery. °· Do not drive until your doctor says it is okay. °· Ask your doctor when you can go back to work. °· Ask your doctor when you can begin sexual activity again. °· Follow up with your doctor as told. °Contact a doctor if: °· You have puffiness, redness, more pain, or fluid draining from the incision site. °· You have a fever. °· You have puffiness in your ankles or legs. °· You have pain in your legs. °· You gain 2 or more pounds (0.9 kg) a day. °· You feel sick to your stomach (nauseous) or throw up (vomit). °· You have watery poop (diarrhea). °Get help right away if: °· You have chest pain that goes to your jaw or arms. °· You have shortness of breath. °· You have a fast or irregular heartbeat. °· You notice a "clicking" in your breastbone when you move. °· You have numbness or weakness in your arms or legs. °· You feel dizzy or light-headed. °This information is not intended to replace advice given to you by   your health care provider. Make sure you discuss any questions you have with your health care provider. Document Released: 09/18/2013 Document Revised: 02/19/2016 Document Reviewed: 02/20/2013 Elsevier Interactive Patient Education  2017 [employer].  -----------------------------------------------------------------------------------------------------------------------------------------------------------------------------------   Information on my medicine - Coumadin   (Warfarin)  This medication education was reviewed with me or my healthcare representative as  part of my discharge preparation.   Why was Coumadin prescribed for you? Coumadin was prescribed for you because you have a blood clot or a medical condition that can cause an increased risk of forming blood clots. Blood clots can cause serious health problems by blocking the flow of blood to the heart, lung, or brain. Coumadin can prevent harmful blood clots from forming. As a reminder your indication for Coumadin is:   Stroke Prevention Because Of Atrial Fibrillation  What test will check on my response to Coumadin? While on Coumadin (warfarin) you will need to have an INR test regularly to ensure that your dose is keeping you in the desired range. The INR (international normalized ratio) number is calculated from the result of the laboratory test called prothrombin time (PT).  If an INR APPOINTMENT HAS NOT ALREADY BEEN MADE FOR YOU please schedule an appointment to have this lab work done by your health care provider within 7 days. Your INR goal is usually a number between:  2 to 3 or your provider may give you a more narrow range like 2-2.5.  Ask your health care provider during an office visit what your goal INR is.  What  do you need to  know  About  COUMADIN? Take Coumadin (warfarin) exactly as prescribed by your healthcare provider about the same time each day.  DO NOT stop taking without talking to the doctor who prescribed the medication.  Stopping without other blood clot prevention medication to take the place of Coumadin may increase your risk of developing a new clot or stroke.  Get refills before you run out.  What do you do if you miss a dose? If you miss a dose, take it as soon as you remember on the same day then continue your regularly scheduled regimen the next day.  Do not take two doses of Coumadin at the same time.  Important Safety Information A possible side effect of Coumadin (Warfarin) is an increased risk of bleeding. You should call your healthcare provider right away  if you experience any of the following: ? Bleeding from an injury or your nose that does not stop. ? Unusual colored urine (red or dark brown) or unusual colored stools (red or black). ? Unusual bruising for unknown reasons. ? A serious fall or if you hit your head (even if there is no bleeding).  Some foods or medicines interact with Coumadin (warfarin) and might alter your response to warfarin. To help avoid this: ? Eat a balanced diet, maintaining a consistent amount of Vitamin K. ? Notify your provider about major diet changes you plan to make. ? Avoid alcohol or limit your intake to 1 drink for women and 2 drinks for men per day. (1 drink is 5 oz. wine, 12 oz. beer, or 1.5 oz. liquor.)  Make sure that ANY health care provider who prescribes medication for you knows that you are taking Coumadin (warfarin).  Also make sure the healthcare provider who is monitoring your Coumadin knows when you have started a new medication including herbals and non-prescription products.  Coumadin (Warfarin)  Major Drug Interactions  Increased Warfarin  Effect Decreased Warfarin Effect  Alcohol (large quantities) Antibiotics (esp. Septra/Bactrim, Flagyl, Cipro) Amiodarone (Cordarone) Aspirin (ASA) Cimetidine (Tagamet) Megestrol (Megace) NSAIDs (ibuprofen, naproxen, etc.) Piroxicam (Feldene) Propafenone (Rythmol SR) Propranolol (Inderal) Isoniazid (INH) Posaconazole (Noxafil) Barbiturates (Phenobarbital) Carbamazepine (Tegretol) Chlordiazepoxide (Librium) Cholestyramine (Questran) Griseofulvin Oral Contraceptives Rifampin Sucralfate (Carafate) Vitamin K   Coumadin (Warfarin) Major Herbal Interactions  Increased Warfarin Effect Decreased Warfarin Effect  Garlic Ginseng Ginkgo biloba Coenzyme Q10 Green tea St. Johns wort    Coumadin (Warfarin) FOOD Interactions  Eat a consistent number of servings per week of foods HIGH in Vitamin K (1 serving =  cup)  Collards (cooked, or boiled &  drained) Kale (cooked, or boiled & drained) Mustard greens (cooked, or boiled & drained) Parsley *serving size only =  cup Spinach (cooked, or boiled & drained) Swiss chard (cooked, or boiled & drained) Turnip greens (cooked, or boiled & drained)  Eat a consistent number of servings per week of foods MEDIUM-HIGH in Vitamin K (1 serving = 1 cup)  Asparagus (cooked, or boiled & drained) Broccoli (cooked, boiled & drained, or raw & chopped) Brussel sprouts (cooked, or boiled & drained) *serving size only =  cup Lettuce, raw (green leaf, endive, romaine) Spinach, raw Turnip greens, raw & chopped   These websites have more information on Coumadin (warfarin):  FailFactory.se; VeganReport.com.au;

## 2017-08-23 NOTE — Progress Notes (Addendum)
[id number]       [place] [id number]             [phone]      6 Days Post-Op Procedure(s) (LRB): CORONARY ARTERY BYPASS GRAFTING (CABG) TIMES 1 USING LEFT INTERNAL MAMMARY ARTERY (N/A) TRANSESOPHAGEAL ECHOCARDIOGRAM (TEE) (N/A) Subjective: Feels well, had a BM  Objective: Vital signs in last 24 hours: Temp:  [98.3 F (36.8 C)-99.1 F (37.3 C)] 99.1 F (37.3 C) (11/27 0618) Pulse Rate:  [87-105] 102 (11/27 0618) Cardiac Rhythm: Normal sinus rhythm;Bundle branch block (11/26 1940) Resp:  [20-30] 26 (11/27 0618) BP: (105-149)/(55-67) 139/67 (11/27 0618) SpO2:  [97 %-100 %] 97 % (11/27 0618) Weight:  [253 lb 8 oz (115 kg)] 253 lb 8 oz (115 kg) (11/27 0618)  Hemodynamic parameters for last 24 hours:    Intake/Output from previous day: 11/26 0701 - 11/27 0700 In: 840 [P.O.:840] Out: 1300 [Urine:1300] Intake/Output this shift: No intake/output data recorded.  General appearance: alert, cooperative and no distress Heart: regular rate and rhythm Lungs: clear to auscultation bilaterally Abdomen: benign Extremities: + edema Wound: incis healing well  Lab Results: Recent Labs    08/21/17 0409  WBC 10.3  HGB 8.1*  HCT 24.7*  PLT 197   BMET:  Recent Labs    08/21/17 0409  NA 135  K 4.7  CL 105  CO2 23  GLUCOSE 120*  BUN 34*  CREATININE 1.32*  CALCIUM 8.6*    PT/INR:  Recent Labs    08/22/17 1156  LABPROT 15.5*  INR 1.24   ABG    Component Value Date/Time   PHART 7.363 08/17/2017 1843   HCO3 23.7 08/17/2017 1843   TCO2 24 08/17/2017 1847   ACIDBASEDEF 2.0 08/17/2017 1843   O2SAT 100.0 08/17/2017 1843   CBG (last 3)  Recent Labs    08/22/17 1654 08/22/17 2141 08/23/17 0638  GLUCAP 95 108* 83    Meds Scheduled Meds: . aspirin EC  81 mg Oral Daily  . bisacodyl  10 mg Oral Daily  . Chlorhexidine Gluconate Cloth  6 each Topical Daily  . docusate sodium  200 mg Oral Daily  . furosemide  40 mg Oral Daily  .  glimepiride  2 mg Oral Daily  . hydrochlorothiazide  25 mg Oral Daily  . insulin aspart  0-20 Units Subcutaneous TID WC  . insulin aspart  0-5 Units Subcutaneous QHS  . insulin detemir  20 Units Subcutaneous BID  . lisinopril  20 mg Oral Daily  . metoprolol tartrate  25 mg Oral BID  . pantoprazole  40 mg Oral Daily  . potassium chloride  20 mEq Oral Daily  . simvastatin  40 mg Oral QHS  . sodium chloride flush  3 mL Intravenous Q12H  . warfarin  5 mg Oral q1800  . Warfarin - Physician Dosing Inpatient   Does not apply q1800   Continuous Infusions: . sodium chloride    . lactated ringers 10 mL/hr at 08/21/17 0400   PRN Meds:.sodium chloride, metoprolol tartrate, ondansetron (ZOFRAN) IV, oxyCODONE, sodium chloride flush, traMADol  Xrays No results found.  Assessment/Plan: S/P Procedure(s) (LRB): CORONARY ARTERY BYPASS GRAFTING (CABG) TIMES 1 USING LEFT INTERNAL MAMMARY ARTERY (N/A) TRANSESOPHAGEAL ECHOCARDIOGRAM (TEE) (N/A)  1 steady progress conts 2 inr 1.2 cont coumadin 3 SR/ST- cont current meds- deconditioning affects HR 4 BP control reasonable on current meds with some variability- cont to monitor 5 BS control pretty good on current RX, severe  metabolic syndrome 6 SNF when bed available  LOS: 12 days    John Giovanni 08/23/2017 Patient seen and examined, agree with above Ready for SNF  Wilber Fini C. Roxan Hockey, MD [facility] [phone]

## 2017-08-23 NOTE — Care Management Note (Signed)
Case Management Note Marvetta Gibbons RN, BSN Unit 4E-Case Manager [phone]  Patient Details  Name: [name] MRN: [id number] Date of Birth: [birth year]  Subjective/Objective:   Pt admitted with chest pain-  s/p CABGx1 on 11/21                Action/Plan: PTA pt lived at home- per PT eval SNF recommended- CSW consulted for SNF placement pending insurance auth.   Expected Discharge Date:  08/23/17               Expected Discharge Plan:  [place]  In-House Referral:  Clinical Social Work  Discharge planning Services  CM Consult  Post Acute Care Choice:    Choice offered to:     DME Arranged:    DME Agency:     HH Arranged:    [place] Agency:     Status of Service:  Completed, signed off  If discussed at [employer], dates discussed:    Discharge Disposition: skilled facility   Additional Comments:  08/23/17- [place]- per CSW pt has insurance auth for SNF today- plan to d/c to SNF- CSW following for placement needs  Dawayne Patricia, RN 08/23/2017, 4:48 PM

## 2017-08-23 NOTE — Telephone Encounter (Signed)
REFILL 

## 2017-08-23 NOTE — Progress Notes (Signed)
Clinical Social Worker facilitated patient discharge including contacting patient family and facility to confirm patient discharge plans.  Clinical information faxed to facility and family agreeable with plan.  CSW arranged ambulance transport via [place] to [facility] .  RN Lattie Haw to call [phone] (pt will go in room 107) for  report prior to discharge.  Clinical Social Worker will sign off for now as social work intervention is no longer needed. Please consult [place] again if new need arises.  Rhea Pink, MSW, [place]

## 2017-08-23 NOTE — Progress Notes (Signed)
Removed CT sutures and applied benzoin and 1/2 " steri strips.  Pt tolerated procedure well. Pt given signs and symptoms of infection. Payton Emerald, RN

## 2017-08-23 NOTE — Progress Notes (Signed)
Called report to nurse Cloyd Stagers at [facility] for admission to [facility].  Vital signs stable, patient is comfortable and all needs are met, awaiting [facility] transport.

## 2017-08-23 NOTE — Progress Notes (Signed)
Removed right IJ introducer and applied petroleum dressing after holding pressure. Payton Emerald, RN

## 2017-08-24 DIAGNOSIS — E785 Hyperlipidemia, unspecified: Secondary | ICD-10-CM | POA: Diagnosis not present

## 2017-08-24 DIAGNOSIS — I4891 Unspecified atrial fibrillation: Secondary | ICD-10-CM | POA: Diagnosis not present

## 2017-08-24 DIAGNOSIS — E119 Type 2 diabetes mellitus without complications: Secondary | ICD-10-CM | POA: Diagnosis not present

## 2017-08-24 DIAGNOSIS — I10 Essential (primary) hypertension: Secondary | ICD-10-CM | POA: Diagnosis not present

## 2017-08-24 DIAGNOSIS — I2510 Atherosclerotic heart disease of native coronary artery without angina pectoris: Secondary | ICD-10-CM | POA: Diagnosis not present

## 2017-08-25 ENCOUNTER — Encounter: Payer: Self-pay | Admitting: *Deleted

## 2017-08-25 ENCOUNTER — Other Ambulatory Visit: Payer: Self-pay

## 2017-08-25 ENCOUNTER — Telehealth (HOSPITAL_COMMUNITY): Payer: Self-pay

## 2017-08-25 DIAGNOSIS — Z951 Presence of aortocoronary bypass graft: Secondary | ICD-10-CM

## 2017-08-25 NOTE — Telephone Encounter (Signed)
Patients insurance is active and benefits verified through [facility] - $10.00 co-pay, no deductible, out of pocket amount of $5,900/$347.72 has been met, no co-insurance, and no pre-authorization is required. Passport/reference [id number]  Patient will be contacted and scheduled after their follow up appt with the Cardiologist office upon review by the RN Navigator.

## 2017-08-26 ENCOUNTER — Other Ambulatory Visit: Payer: Self-pay | Admitting: *Deleted

## 2017-08-26 ENCOUNTER — Encounter: Payer: Self-pay | Admitting: *Deleted

## 2017-08-26 NOTE — Patient Outreach (Signed)
[facility]) Care Management  08/26/2017  [name] [birth year] [id number]   CSW was able to make contact with patient today to perform the initial assessment, as well as assess and assist with social work needs and services, when CSW met with patient at [facility] where patient currently resides to receive short-term rehabilitative services.  CSW introduced self, explained role and types of services provided through [facility] Care Management [facility] Care Management).  CSW further explained to patient that CSW works with Charlesetta Shanks, [facility], also with [facility] Care Management. CSW then explained the reason for the visit, indicating that [name] thought that patient would benefit from social work services and resources to assist with discharge planning needs and services from the skilled nursing facility.  CSW obtained two HIPAA compliant identifiers from patient, which included patient's name and date of birth. Patient reported that she plans to return home to live with her son, [name] at time of discharge from [facility].  Patient does not have a tentative discharge date yet, but reports she will more than likely be at the facility for an additional three weeks.  Patient is interested in having a home health aide assist her with bathing and meal preparation, for a brief period of time, upon returning home.  Patient reported that Mr. Tack works during the day so she is home alone in the mornings and afternoons.  Patient believes she will need the home health aide for about a month, just while she continues to get stronger and more mobile. CSW explained to patient that patient is entitled to receive a home health aide as long as there is a skilled need in the home.  For example, when home health nursing, physical therapy and occupational therapy is arranged for patient at time of discharge, CSW will request that  patient also receive an order to have home health aide services.  Patient voiced understanding and was agreeable to this plan.  Patient will decide on a home health agency of choice and report these findings to CSW within the next three weeks.  Patient is not interested in receiving the home health aide long-term, nor is patient interested in receiving a private agency sitter list. Patient reported that she already has durable medical equipment in the home, which includes all of the following: Grab Bars in [employer] No additional durable medical equipment has been identified at this time.  CSW agreed to follow-up with patient in two weeks to assess and assist with discharge planning needs and services. [facility] CM Care Plan Problem One     Most Recent Value  Care Plan Problem One  Level of care issues.  Role Documenting the Problem One  Clinical Social Worker  Care Plan for Problem One  Active  [facility] Long Term Goal   Patient will have home health services and durable medical equipment in place, within the next 45 days.  [facility] Long Term Goal Start Date  08/26/17  Interventions for Problem One Long Term Goal  CSW will assist patient and discharge planning coordinator at the skilled facility with arranging home health services and durable medical equipment for patient in the home.  [facility] CM Short Term Goal #1   Patient will decide on a home health agency of choice, with regards to home care services, within the next three weeks.  [facility] CM Short Term Goal #1 Start Date  08/26/17  Interventions for Short Term  Goal #1  CSW will provide patient with a list of home health agencies offering home care services.  [facility] CM Short Term Goal #2   Patient will decide on an agency of choice, with regards to durable medical equipment, within the next three weeks.  [facility] CM Short Term Goal #2 Start Date  08/26/17  Interventions for Short Term Goal #2  CSW will provide patient with a list of agencies  offering durable medical equipment.     Danford Bad, BSW, MSW, LCSW  Licensed Restaurant manager, [employer] [facility]  [place] [address] [place], [place] [address] Physical Address-[address], [place], [place] [address] Toll Free Main # [phone] Fax # [phone] Cell # [phone]  Office # [phone] [email or web address]

## 2017-08-30 ENCOUNTER — Encounter: Payer: Self-pay | Admitting: Physician Assistant

## 2017-08-30 ENCOUNTER — Ambulatory Visit (INDEPENDENT_AMBULATORY_CARE_PROVIDER_SITE_OTHER): Payer: Medicare HMO | Admitting: Physician Assistant

## 2017-08-30 VITALS — BP 108/61 | HR 82 | Ht 63.0 in | Wt 273.2 lb

## 2017-08-30 DIAGNOSIS — I5033 Acute on chronic diastolic (congestive) heart failure: Secondary | ICD-10-CM | POA: Diagnosis not present

## 2017-08-30 DIAGNOSIS — Z951 Presence of aortocoronary bypass graft: Secondary | ICD-10-CM

## 2017-08-30 DIAGNOSIS — I200 Unstable angina: Secondary | ICD-10-CM

## 2017-08-30 DIAGNOSIS — I480 Paroxysmal atrial fibrillation: Secondary | ICD-10-CM

## 2017-08-30 NOTE — Patient Instructions (Signed)
Medication Instructions:  INCREASE LASIX 80MG  DAILY X 1 WEEK THEN BACK TO 40MG  DAILY If you need a refill on your cardiac medications before your next appointment, please call your pharmacy.  LAB: BMET TODAY AND AT THE FACILITY ON 09-08-17 AND FAX LAB RESULTS TO [phone].  Follow-Up: Your physician wants you to follow-up in: [place], PA-C.  Thank you for choosing [facility]!!

## 2017-08-30 NOTE — Progress Notes (Signed)
Cardiology Office Note   Date:  08/30/2017   ID:  [name], DOB 1951/04/09, MRN [id number]  PCP:  [name], MD  Cardiologist: Dr. Sallyanne Kuster, 02/22/2017 Rosaria Ferries, PA-C   Chief Complaint  Patient presents with  . Follow-up    History of Present Illness: [name] is a 66 y.o. female with a history of LAD stent with 2 episodes of re-stenosis, HTN, HLD, T2DM, GERD with esophageal stricture, lung CA s/p LUL VATS, HH, PAF on Coumadin, NSTEMI 2013, OSA not on CPAP  Admitted 11/13- 08/23/2017 for chest pain, LAD ISR at cath>>CABG w/ LIMA-LAD. She went to [facility] for rehab.  [name] presents for cardiology follow up.  She is working with rehab. She is very sore. She is trying to walk as much as she can. She struggles to get up, has to have assistance. She uses a walker to get to a wheelchair.   She has continued to have LE edema. The L leg is much more edematous than the R.  She is not on a low Na diet. Has not been weighed since arrival at [facility].   She denies PND, has orthopnea. She has significant DOE, has not changed since admission.   She is still having soreness in her L chest, especially when she coughs. She is not wearing a bra, PT is ordering her a sternal pad. She coughs when she talks a lot. Seldom coughs at night.   They are following her coumadin. The last reading was 1.7.   Past Medical History:  Diagnosis Date  . Arthritis    "knees" (02/05/2016)  . Cancer ([place]) 5/10   LUL Vats   . Coronary artery disease   . GERD (gastroesophageal reflux disease)    tx meds  . H/O hiatal hernia   . History of blood transfusion "several"   last april 2015 s/p knee surgery (02/05/2016)  . Hypercholesteremia   . Hypertension   . Lung cancer ([place])    2010, surgery 18% left lung no radiation or chemo  . Non-STEMI (non-ST elevated myocardial infarction) ([place]) 04/07/2012   See cath results below  . Paroxysmal atrial  fibrillation ([place]) 04/07/2012   On Warfarin  . Peripheral vascular disease ([place]) 8/12   Lt SFA PTA  . Presence of stent in LAD coronary artery 04/07/2012   Xience Expedition DES 2.75 mm x 18 mm (dilated to 3.0 mm)  . Sleep apnea    does not wear CPAP  . Type II diabetes mellitus (HCC)    insulin dependent    Past Surgical History:  Procedure Laterality Date  . ABDOMINAL HYSTERECTOMY  1990  . BREAST EXCISIONAL BIOPSY Right   . CARDIAC CATHETERIZATION  11/04/2008   patent RCA, LM, and Circ, nl EF  . CARDIAC CATHETERIZATION  02/20/2002   patent coronaries with the only abnormality being a smooth luminla irregularity in the mid intermediate ramus branch no felt to be hemodynamically significant, nl LV  . CARDIAC CATHETERIZATION N/A 02/05/2016   Procedure: Left Heart Cath and Coronary Angiography;  Surgeon: Leonie Man, MD;  Location: [facility] CV LAB;  Service: Cardiovascular;  Laterality: N/A;  . CARDIAC CATHETERIZATION N/A 02/05/2016   Procedure: Coronary Stent Intervention;  Surgeon: Leonie Man, MD;  Location: [facility] CV LAB;  Service: Cardiovascular;  Laterality: N/A;  . CATARACT EXTRACTION W/ INTRAOCULAR LENS  IMPLANT, BILATERAL  2013  . CORONARY ANGIOPLASTY WITH STENT PLACEMENT  04/07/2012   Xience [employer]  DES 2.75mm x 18 mm (dilated to 3.0 mm) to the prox LAD   . CORONARY ANGIOPLASTY WITH STENT PLACEMENT  2013; 2015  . CORONARY ARTERY BYPASS GRAFT N/A 08/17/2017   Procedure: CORONARY ARTERY BYPASS GRAFTING (CABG) TIMES 1 USING LEFT INTERNAL MAMMARY ARTERY;  Surgeon: Melrose Nakayama, MD;  Location: [facility];  Service: Open Heart Surgery;  Laterality: N/A;  . DILATION AND CURETTAGE OF UTERUS  <1990  . ESOPHAGOGASTRODUODENOSCOPY N/A 05/23/2015   Procedure: ESOPHAGOGASTRODUODENOSCOPY (EGD);  Surgeon: Carol Ada, MD;  Location: [facility];  Service: Endoscopy;  Laterality: N/A;  . ESOPHAGOGASTRODUODENOSCOPY (EGD) WITH PROPOFOL N/A 06/13/2015   Procedure:  ESOPHAGOGASTRODUODENOSCOPY (EGD) WITH PROPOFOL;  Surgeon: Carol Ada, MD;  Location: [facility];  Service: Endoscopy;  Laterality: N/A;  . FRACTURE SURGERY    . HAMMER TOE SURGERY Bilateral 1993   with screws, on screw removed  . JOINT REPLACEMENT    . KNEE ARTHROSCOPY Bilateral    left x2, right x1  . LAPAROSCOPIC CHOLECYSTECTOMY    . LEFT HEART CATH AND CORONARY ANGIOGRAPHY N/A 08/11/2017   Procedure: LEFT HEART CATH AND CORONARY ANGIOGRAPHY;  Surgeon: Martinique, Peter M, MD;  Location: [facility] CV LAB;  Service: Cardiovascular;  Laterality: N/A;  . LEFT HEART CATHETERIZATION WITH CORONARY ANGIOGRAM N/A 04/07/2012   Procedure: LEFT HEART CATHETERIZATION WITH CORONARY ANGIOGRAM;  Surgeon: [name], MD;  Location: [facility] CATH LAB;  Service: Cardiovascular;  Laterality: N/A;  . LEFT HEART CATHETERIZATION WITH CORONARY ANGIOGRAM N/A 05/27/2014   Procedure: LEFT HEART CATHETERIZATION WITH CORONARY ANGIOGRAM;  Surgeon: [name], MD; LAD 99% ISR, CFX 50-60%, RCA (dominant) no sig dz, EF 60%  . LOWER EXTREMITY ANGIOGRAM  05/17/11   directional atherectomy to the prox L SFA using a LX Man TurboHawk, ballooned with a [employer] balloon   . ORIF ANKLE FRACTURE  07/09/2012   Procedure: OPEN REDUCTION INTERNAL FIXATION (ORIF) ANKLE FRACTURE;  Surgeon: Meredith Pel, MD;  [facility];  Service: Orthopedics;  Laterality: Left;  open reduction internal fixation trimalleolar ankle fracture medial malleolous fixation  . PERCUTANEOUS CORONARY STENT INTERVENTION (PCI-S) N/A 04/07/2012   Procedure: PERCUTANEOUS CORONARY STENT INTERVENTION (PCI-S);  Surgeon: [name], MD;  Location: [facility] CATH LAB;  Service: Cardiovascular;  Laterality: N/A;  . PERCUTANEOUS CORONARY STENT INTERVENTION (PCI-S)  05/27/2014   Procedure: PERCUTANEOUS CORONARY STENT INTERVENTION (PCI-S);  Surgeon: [name], MD; 3 mm x 12 mm long Xience Xpedition DES to the proximal LAD  . SAVORY DILATION N/A 06/13/2015     Procedure: SAVORY DILATION;  Surgeon: Carol Ada, MD;  [facility];  Service: Endoscopy;  Laterality: N/A;  . TEE WITHOUT CARDIOVERSION N/A 08/17/2017   Procedure: TRANSESOPHAGEAL ECHOCARDIOGRAM (TEE);  Surgeon: Melrose Nakayama, MD;  Location: [facility];  Service: Open Heart Surgery;  Laterality: N/A;  . TONSILLECTOMY    . TOTAL KNEE ARTHROPLASTY Left 2003  . TOTAL KNEE REVISION Left 11/05/2013   Procedure: LEFT TOTAL KNEE RESECTION;  Surgeon: Mauri Pole, MD;  [facility];  Service: Orthopedics;  Laterality: Left;  . TOTAL KNEE REVISION Left 2007   opened in 2006 and cleaned out, 2007 revision  . TOTAL KNEE REVISION Left 12/31/2013   Procedure: RE-INPLANTATION LEFT TOTAL KNEE ;  Surgeon: Mauri Pole, MD;  [facility];  Service: Orthopedics;  Laterality: Left;  Marland Kitchen VIDEO ASSISTED THORACOSCOPY (VATS)/ LOBECTOMY  01/2009    Current Outpatient Medications  Medication Sig Dispense Refill  . furosemide (LASIX) 40  MG tablet Take 40 mg by mouth daily.    Marland Kitchen glimepiride (AMARYL) 2 MG tablet Take 2 mg by mouth daily with breakfast.    . insulin NPH-regular Human (NOVOLIN 70/30) (70-30) 100 UNIT/ML injection Inject 30-34 Units into the skin 2 (two) times daily with a meal. Prior to surgery, she took 42 units in the morning 20 units in the evening. As of 08/23/2017, she is on 20 units in am and 20 units in the pm. Her Insulin gradually needs to be increased to pre surgery doses as glucose levels allow. 10 mL 11  . lisinopril-hydrochlorothiazide (PRINZIDE,ZESTORETIC) 20-25 MG per tablet Take 1 tablet by mouth daily.    . metoprolol tartrate (LOPRESSOR) 25 MG tablet Take 1 tablet (25 mg total) by mouth 2 (two) times daily. 60 tablet 11  . oxyCODONE (OXY IR/ROXICODONE) 5 MG immediate release tablet Take 5 mg by mouth every 4-6 hours PRN severe pain 30 tablet 0  . pantoprazole (PROTONIX) 40 MG tablet TAKE 1 TABLET BY MOUTH EVERY DAY 90 tablet 2  . simvastatin (ZOCOR) 40 MG tablet  Take 40 mg by mouth daily.    . Vitamin D, Cholecalciferol, 1000 units TABS Take 1 tablet by mouth every morning.    . warfarin (COUMADIN) 5 MG tablet Take 5 mg by mouth or as directed. 90 tablet 1   No current facility-administered medications for this visit.     Allergies:   Levofloxacin and Morphine and related    Social History:  The patient  reports that she quit smoking about 32 years ago. Her smoking use included cigarettes. She has a 20.00 pack-year smoking history. she has never used smokeless tobacco. She reports that she does not drink alcohol or use drugs.   Family History:  The patient's family history includes Coronary artery disease in her brother; Hypertension in her mother and sister.    ROS:  Please see the history of present illness. All other systems are reviewed and negative.    PHYSICAL EXAM: VS:  BP 108/61   Pulse 82   Ht 5\' 3"  (1.6 m)   Wt 273 lb 3.2 oz (123.9 kg)   BMI 48.40 kg/m  , BMI Body mass index is 48.4 kg/m. GEN: Well nourished, well developed, female in no acute distress  HEENT: normal for age  Neck: JVD at 10 cm, no carotid bruit, no masses Cardiac: RRR; soft murmur, no rubs, or gallops Respiratory:  Decreased BS bases bilaterally with a few rales, normal work of breathing GI: soft, nontender, nondistended, + BS MS: no deformity or atrophy; 2+ L, 1+ R LE edema; distal pulses are 2+ in all 4 extremities   Skin: warm and dry, no rash; Incision is healing well.  Neuro:  Strength and sensation are intact Psych: euthymic mood, full affect   EKG:  EKG is ordered today. The ekg ordered today demonstrates sinus rhythm, heart rate 82, right bundle branch block is old, no new ischemic changes  Cardiac catheterization, 08/11/2017.    Prox Cx lesion is 50% stenosed.  Mid RCA lesion is 25% stenosed.  Ost LAD to Prox LAD lesion is 90% stenosed.  The left ventricular systolic function is normal.  LV end diastolic pressure is normal.  The left  ventricular ejection fraction is 55-65% by visual estimate. 1. Single vessel obstructive CAD with severe in stent restenosis in the proximal LAD 2. Normal LV function 3. Normal LVEDPThis revealed the following: Due to these findings cardiothoracic surgical consultation was obtained and  recommendations were made for coronary artery bypass grafting. A plavix washout period was recommended.   SURGERY: 08/17/2017 POSTOPERATIVE DIAGNOSIS: Severe single-vessel coronary artery disease with in-stent restenosis. PROCEDURES: Median sternotomy, extracorporeal circulation, coronary artery bypass grafting x1 with left internal mammary artery to LAD.  ECHO: 08/15/2017 - Left ventricle: The cavity size was normal. Systolic function was   normal. The estimated ejection fraction was in the range of 55%   to 60%. Wall motion was normal; there were no regional wall   motion abnormalities. Features are consistent with a pseudonormal   left ventricular filling pattern, with concomitant abnormal   relaxation and increased filling pressure (grade 2 diastolic   dysfunction). - Left atrium: The atrium was mildly dilated.  Recent Labs: 08/13/2017: ALT 15; B Natriuretic Peptide 81.8; TSH 1.744 08/18/2017: Magnesium 2.5 08/21/2017: BUN 34; Creatinine, Ser 1.32; Hemoglobin 8.1; Platelets 197; Potassium 4.7; Sodium 135    Lipid Panel    Component Value Date/Time   CHOL 133 08/10/2017 1141   TRIG 78 08/10/2017 1141   HDL 46 08/10/2017 1141   CHOLHDL 2.9 08/10/2017 1141   VLDL 16 08/10/2017 1141   LDLCALC 71 08/10/2017 1141   LDLDIRECT 143 (H) 10/10/2008 2042     Wt Readings from Last 3 Encounters:  08/30/17 273 lb 3.2 oz (123.9 kg)  08/23/17 253 lb 8 oz (115 kg)  02/22/17 269 lb (122 kg)     Other studies Reviewed: Additional studies/ records that were reviewed today include: Office notes, hospital records and testing.  ASSESSMENT AND PLAN:  1.  Unstable anginal pain: s/p CABG w/ LIMA-LAD.   She is not on aspirin because of the Coumadin.  She is on a beta-blocker and a statin.  Continue current therapy.  Her blood pressure is well controlled.  A sports bra might help some of her chest pain, it is okay to wear one.  Her incisions are healing well.  2.  Acute on chronic diastolic CHF: Her weight is up 20 pounds since discharge.  She has significant volume overload by exam.  We will increase her Lasix to 80 mg daily for 1 week and then back to 40 mg daily.  [name] was given instructions on a low-sodium diet and daily weights.  She is to get a BMET in 1 week at the facility.  3.  Hypertension: Her blood pressures on the low side of normal today.  I do not wish to have her on a high dose of Lasix and the HCTZ 25 mg.  I am also concerned about her kidney function.  Therefore, I will hold the lisinopril HCTZ for now.  If her renal function remains stable when her volume status is improved, restart it.   Current medicines are reviewed at length with the patient today.  The patient does not have concerns regarding medicines.  The following changes have been made: Increase Lasix, hold lisinopril HCTZ  Labs/ tests ordered today include:   Orders Placed This Encounter  Procedures  . Basic metabolic panel  . EKG 12-Lead     Disposition:   FU with Dr. Sallyanne Kuster  Signed, Rosaria Ferries, PA-C  08/30/2017 6:01 PM    [place] Phone: [phone]; Fax: [phone]  This note was written with the assistance of speech recognition software. Please excuse any transcriptional errors.

## 2017-08-31 LAB — BASIC METABOLIC PANEL
BUN / CREAT RATIO: 16 (ref 12–28)
BUN: 22 mg/dL (ref 8–27)
CHLORIDE: 98 mmol/L (ref 96–106)
CO2: 25 mmol/L (ref 20–29)
Calcium: 9.5 mg/dL (ref 8.7–10.3)
Creatinine, Ser: 1.4 mg/dL — ABNORMAL HIGH (ref 0.57–1.00)
GFR calc non Af Amer: 39 mL/min/{1.73_m2} — ABNORMAL LOW (ref >59)
GFR, EST AFRICAN AMERICAN: 45 mL/min/{1.73_m2} — AB (ref >59)
GLUCOSE: 187 mg/dL — AB (ref 65–99)
POTASSIUM: 5.3 mmol/L — AB (ref 3.5–5.2)
SODIUM: 138 mmol/L (ref 134–144)

## 2017-09-05 ENCOUNTER — Ambulatory Visit: Payer: Medicare HMO | Admitting: Physician Assistant

## 2017-09-06 ENCOUNTER — Telehealth: Payer: Self-pay | Admitting: Pharmacist

## 2017-09-06 NOTE — Patient Outreach (Signed)
[facility]) Care Management  [place]   09/06/2017  [name] [birth year] [id number]  Subjective: 66 y.o. year old female referred to [facility] for Transitions Of Care (Pharmacy telephone outreach)   PMH s/f: arthritis, coronary artery disease s/p stent to LAD, h/o NSTEMI and CABG, peripheral vascular disease, CKD III, gastroesophageal reflux disease, hypercholesterolemia, hypertension, remote lung cancer, paroxysmal afib, sleep apnea, type 2 diabetes, morbid obesity  Patient with [facility] Advantage insurance  Patient was recently discharged from the hospital after admission for chest pain, found to have significant in-stent restenosis, normal LV function. During this hospitalization, patient underwent plavix and coumadin washout and had a CABG. She was discharged to skilled nursing facility for rehabilitation ([facility]).   Today, patient gives verbal consent to speak with me over the phone. She identifies herself with two HIPAA-identifiers. She reports 100% adherence to prescribed medications and is very knowledgeable about her prescribed regimen. She reports her furosemide was increased to 80 mg daily x1 week. Notes good diuresis with this dose. She reports that she has stopped taking clopidogrel and was instructed to start back on aspirin instead. She denies taking brillinta or effient. She is currently on coumadin 5 mg with last INR 12/10 at 2.2 per patient report. No s/sx bleeding. Patient verbalizes understanding of bleeding signs to watch for and s/sx of stroke. She is currently on Novolin 70/30 20 units BID and sugars are "doing good". Denies any hypoglycemia. Verbalizes appropriate management of hypoglycemia. Post-operative pain from CABG is lessening and she takes 0-1 oxycodone 5 mg tablets daily. Denies any issues with constipation.    Objective:  Last cathprior to this admission was 2017 -Ost - Proximal LAD lesion, 80%  and 99% in-stent restenosis. Post intervention with scoring balloon angioplasty followed by a Synergy DES stent covering the entire stented segment., there is a 0% residual stenosis. The lesion was previously treated with a 2 overlapping DES stents in 2013 and 2015. -Prox Cx lesion, 50% stenosed. Stable -Ramus lesion, 20% stenosed. -The left ventricular systolicfunction was normal  Cath this admission on 08/11/2017 revealed:   Prox Cx lesion is 50% stenosed.  Mid RCA lesion is 25% stenosed.  Ost LAD to Prox LAD lesion is 90% stenosed.  The left ventricular systolic function is normal.  LV end diastolic pressure is normal.  The left ventricular ejection fraction is 55-65% by visual estimate.  P2Y12 level found to be 216 on plavix  Lipid Panel     Component Value Date/Time   CHOL 133 08/10/2017 1141   TRIG 78 08/10/2017 1141   HDL 46 08/10/2017 1141   CHOLHDL 2.9 08/10/2017 1141   VLDL 16 08/10/2017 1141   LDLCALC 71 08/10/2017 1141   LDLDIRECT 143 (H) 10/10/2008 2042    Encounter Medications: Outpatient Encounter Medications as of 09/06/2017  Medication Sig Note  . aspirin EC 81 MG tablet Take 81 mg by mouth daily.   . furosemide (LASIX) 40 MG tablet Take 40 mg by mouth daily. 09/06/2017: "upped it to 80 mg daily x1 week"   . glimepiride (AMARYL) 2 MG tablet Take 2 mg by mouth daily with breakfast.   . insulin NPH-regular Human (NOVOLIN 70/30) (70-30) 100 UNIT/ML injection Inject 30-34 Units into the skin 2 (two) times daily with a meal. Prior to surgery, she took 42 units in the morning 20 units in the evening. As of 08/23/2017, she is on 20 units in am and 20 units in the pm.  Her Insulin gradually needs to be increased to pre surgery doses as glucose levels allow. (Patient taking differently: Inject 30-34 Units into the skin 2 (two) times daily with a meal. 20 units under the skin twice daily with breakfast and supper.) 09/06/2017: Patient states she is on 20 units BID at SNF    . lisinopril-hydrochlorothiazide (PRINZIDE,ZESTORETIC) 20-25 MG per tablet Take 1 tablet by mouth daily.   . metoprolol tartrate (LOPRESSOR) 25 MG tablet Take 1 tablet (25 mg total) by mouth 2 (two) times daily.   Marland Kitchen oxyCODONE (OXY IR/ROXICODONE) 5 MG immediate release tablet Take 5 mg by mouth every 4-6 hours PRN severe pain   . pantoprazole (PROTONIX) 40 MG tablet TAKE 1 TABLET BY MOUTH EVERY DAY   . simvastatin (ZOCOR) 40 MG tablet Take 40 mg by mouth daily.   . Vitamin D, Cholecalciferol, 1000 units TABS Take 1 tablet by mouth every morning.   . warfarin (COUMADIN) 5 MG tablet Take 5 mg by mouth or as directed.    No facility-administered encounter medications on file as of 09/06/2017.     Functional Status: In your present state of health, do you have any difficulty performing the following activities: 08/26/2017 08/10/2017  Hearing? N N  Vision? N N  Difficulty concentrating or making decisions? N N  Walking or climbing stairs? Y N  Dressing or bathing? N N  Doing errands, shopping? N N  Preparing Food and eating ? Y -  Using the Toilet? N -  In the past six months, have you accidently leaked urine? Y -  Do you have problems with loss of bowel control? N -  Managing your Medications? N -  Managing your Finances? N -  Housekeeping or managing your Housekeeping? Y -  Some recent data might be hidden    Fall/Depression Screening: Fall Risk  08/26/2017 09/17/2014 02/26/2014  Falls in the past year? Yes No No  Number falls in past yr: 1 - -  Injury with Fall? No - -  Risk for fall due to : History of fall(s);Impaired balance/gait;Impaired mobility Impaired mobility;Impaired balance/gait Impaired mobility  Follow up Education provided;Falls prevention discussed - -   PHQ 2/9 Scores 08/26/2017 02/26/2014 01/17/2014 12/17/2013 11/21/2013  PHQ - 2 Score 0 0 0 0 0    ASSESSMENT: Date Discharged from Hospital: 08/23/2017 Date Medication Reconciliation Performed:  09/06/2017  Medications Discontinued at Discharge:   Clopidogrel 75 mg daily  New/Changed Medications at Discharge:   Aspirin 81 mg daily  Furosemide 40 mg daily  Oxycodone 5 mg every 4-6 hours as needed for severe pain  Warfarin 5 mg daily  Novolin 70/30 - dose adjusted to 30-34 units twice daily with meals   Patient was recently discharged from hospital and all medications have been reviewed  Drugs sorted by system:  Cardiovascular: aspirin, simvastatin, metoprolol tartrate, lisinopril - HCTZ, furosemide, warfarin  Gastrointestinal: pantoprazole  Endocrine: glimepiride, Novolin 70/30  Pain: oxycodone  Miscellaneous: vitamin D  Duplications in therapy: none Gaps in therapy: P2Y12 inhibitor (borderline non-responder to plavix), high intensity statin Medications to avoid in the elderly: glimepiride + insulin Drug interactions: none clinically significant Other issues noted: discharge summary instructs patient to start aspirin 81 mg daily but at f/u visit 12/4 with cardiology it is noted that she is not on aspirin because of the coumadin.    PLAN: -Instructed patient to take new medications as prescribed and discontinue old medications as prescribed -Counseled on s/sx of stroke and proper hypoglycemia management as  reinforcement -Will send inbasket message to cards providers Rosaria Ferries, PA-C and Dr. Sallyanne Kuster regarding aspirin clarification, P2Y12 inhibitor clarification, and recommendation for high-intensity statin -No concern for hypoglycemia at this time with Novolin 70/30 and glimepiride. Will route this note to primary care provider to alert them of potential need to d/c the sulfonylurea in the future  Carlean Jews, Pharm.D., BCPS PGY2 Ambulatory Care Pharmacy Resident Phone: [phone]

## 2017-09-07 NOTE — Telephone Encounter (Signed)
As reviewed - restart ASA 81 mg, no other antiplatelet at this time, switch to atorvastatin 80 mg daily (and I will defer to PCP on her diabetes meds, but would love to have her on SGLT-2 inhibitor or GLP-1 agonist instead). MCr

## 2017-09-08 ENCOUNTER — Other Ambulatory Visit: Payer: Self-pay | Admitting: *Deleted

## 2017-09-08 NOTE — Patient Outreach (Signed)
[facility]) Care Management  09/08/2017  [name] 12/16/1950 [id number]   CSW was able to meet with patient today at [facility] [place] where patient currently resides to receive short-term rehabilitative services, to perform a routine visit.  Patient reported that she is scheduled to be discharged home today; however, she has filed an appeal with her insurance company [employer]) and is currently awaiting a response. Patient does not feel that she is ready for discharge and believes that she would greatly benefit from another week of therapies (both physical and occupational).  Patient reported that home health services and durable medical equipment have already been arranged for her through [facility].  Patient agreed to contact CSW as soon as she receives word from the appeal process.  Otherwise, CSW will plan to follow-up with patient again in one week to assess and assist with social work needs and services. [facility] CM Care Plan Problem One     Most Recent Value  Care Plan Problem One  Level of care issues.  Role Documenting the Problem One  Clinical Social Worker  Care Plan for Problem One  Active  [facility] Long Term Goal   Patient will have home health services and durable medical equipment in place, within the next 45 days.  [facility] Long Term Goal Start Date  08/26/17  Interventions for Problem One Long Term Goal  CSW will assist patient and discharge planning coordinator at the skilled facility with arranging home health services and durable medical equipment for patient in the home.  [facility] CM Short Term Goal #1   Patient will decide on a home health agency of choice, with regards to home care services, within the next three weeks.  [facility] CM Short Term Goal #1 Start Date  08/26/17  [facility] CM Short Term Goal #1 Met Date  09/08/17  Interventions for Short Term Goal #1  CSW will provide patient with a list of home health agencies offering home care  services.  [facility] CM Short Term Goal #2   Patient will decide on an agency of choice, with regards to durable medical equipment, within the next three weeks.  [facility] CM Short Term Goal #2 Start Date  08/26/17  [facility] CM Short Term Goal #2 Met Date  09/08/17  Interventions for Short Term Goal #2  CSW will provide patient with a list of agencies offering durable medical equipment.     Nat Christen, BSW, MSW, LCSW  Licensed Education officer, [facility]  [place] [address], [place], [place] [address] Physical Address-[address], [place], [place] [address] Toll Free Main # [phone] Fax # [phone] Cell # [phone]  Office # [phone] [email or web address]

## 2017-09-09 ENCOUNTER — Ambulatory Visit: Payer: Self-pay | Admitting: *Deleted

## 2017-09-12 ENCOUNTER — Other Ambulatory Visit: Payer: Self-pay | Admitting: *Deleted

## 2017-09-12 DIAGNOSIS — E1065 Type 1 diabetes mellitus with hyperglycemia: Secondary | ICD-10-CM | POA: Diagnosis not present

## 2017-09-12 DIAGNOSIS — Z951 Presence of aortocoronary bypass graft: Secondary | ICD-10-CM

## 2017-09-13 ENCOUNTER — Encounter: Payer: Self-pay | Admitting: Thoracic Surgery (Cardiothoracic Vascular Surgery)

## 2017-09-13 ENCOUNTER — Other Ambulatory Visit: Payer: Self-pay

## 2017-09-13 ENCOUNTER — Ambulatory Visit (INDEPENDENT_AMBULATORY_CARE_PROVIDER_SITE_OTHER): Payer: Self-pay | Admitting: Thoracic Surgery (Cardiothoracic Vascular Surgery)

## 2017-09-13 ENCOUNTER — Ambulatory Visit
Admission: RE | Admit: 2017-09-13 | Discharge: 2017-09-13 | Disposition: A | Discharge status: Home / Self Care | Payer: Medicare HMO | Source: Ambulatory Visit | Attending: Thoracic Surgery (Cardiothoracic Vascular Surgery) | Admitting: Thoracic Surgery (Cardiothoracic Vascular Surgery)

## 2017-09-13 VITALS — BP 128/65 | HR 78 | Resp 18 | Ht 63.0 in | Wt 271.2 lb

## 2017-09-13 DIAGNOSIS — Z951 Presence of aortocoronary bypass graft: Secondary | ICD-10-CM

## 2017-09-13 DIAGNOSIS — R0602 Shortness of breath: Secondary | ICD-10-CM | POA: Diagnosis not present

## 2017-09-13 NOTE — Progress Notes (Signed)
[address]       [place] [address]             [phone]     HPI: [name] returns for scheduled postoperative follow-up visit  She is a 66 year old woman with a history of coronary disease, paroxysmal atrial fibrillation, reflux, arthritis with multiple operations, wedge resection for left upper lobe cancer, morbid obesity, and type 2 diabetes.  She presented with unstable angina and was found to have an in-stent restenosis of the proximal LAD stent.  She underwent CABG x1 with LIMA to LAD on November 21.  She did not have any major complications postoperatively but her progress was slow due to her morbid obesity and limited mobility.  She was discharged to a SNF on 08/23/2017.  She now is back at home.  She is not having nearly as much pain and soreness in her sternum as she was previously.  [name] of cardiology had increased her Lasix to 80 mg for a week and now she is back on 40 mg.  Her swelling improved with the 80 mg dose and has remained stable since going back down to 40 mg.  She is only walking 2-3 minutes at a time a couple of times a day, essentially from the bedroom to her living room.  Past Medical History:  Diagnosis Date  . Arthritis    "knees" (02/05/2016)  . Cancer ([place]) 5/10   LUL Vats   . Coronary artery disease   . GERD (gastroesophageal reflux disease)    tx meds  . H/O hiatal hernia   . History of blood transfusion "several"   last april 2015 s/p knee surgery (02/05/2016)  . Hypercholesteremia   . Hypertension   . Lung cancer ([facility])    2010, surgery 18% left lung no radiation or chemo  . Non-STEMI (non-ST elevated myocardial infarction) ([facility]) 04/07/2012   See cath results below  . Paroxysmal atrial fibrillation ([place]) 04/07/2012   On Warfarin  . Peripheral vascular disease ([place]) 8/12   Lt SFA PTA  . Presence of stent in LAD coronary artery 04/07/2012   Xience Expedition DES 2.75 mm x 18 mm (dilated to 3.0 mm)  . Sleep  apnea    does not wear CPAP  . Type II diabetes mellitus ([facility])    insulin dependent    Current Outpatient Medications  Medication Sig Dispense Refill  . aspirin EC 81 MG tablet Take 81 mg by mouth daily.    . furosemide (LASIX) 40 MG tablet Take 40 mg by mouth daily.    Marland Kitchen glimepiride (AMARYL) 2 MG tablet Take 2 mg by mouth daily with breakfast.    . insulin NPH-regular Human (NOVOLIN 70/30) (70-30) 100 UNIT/ML injection Inject 30-34 Units into the skin 2 (two) times daily with a meal. Prior to surgery, she took 42 units in the morning 20 units in the evening. As of 08/23/2017, she is on 20 units in am and 20 units in the pm. Her Insulin gradually needs to be increased to pre surgery doses as glucose levels allow. (Patient taking differently: Inject 30-34 Units into the skin 2 (two) times daily with a meal. 20 units under the skin twice daily with breakfast and supper.) 10 mL 11  . lisinopril-hydrochlorothiazide (PRINZIDE,ZESTORETIC) 20-25 MG per tablet Take 1 tablet by mouth daily.    . metoprolol tartrate (LOPRESSOR) 25 MG tablet Take 1 tablet (25 mg total) by mouth 2 (two) times daily. [place]  tablet 11  . oxyCODONE (OXY IR/ROXICODONE) 5 MG immediate release tablet Take 5 mg by mouth every 4-6 hours PRN severe pain 30 tablet 0  . pantoprazole (PROTONIX) 40 MG tablet TAKE 1 TABLET BY MOUTH EVERY DAY 90 tablet 2  . simvastatin (ZOCOR) 40 MG tablet Take 40 mg by mouth daily.    . Vitamin D, Cholecalciferol, 1000 units TABS Take 1 tablet by mouth every morning.    . warfarin (COUMADIN) 5 MG tablet Take 5 mg by mouth or as directed. 90 tablet 1   No current facility-administered medications for this visit.     Physical Exam BP 128/65   Pulse 78   Resp 18   Ht 5\' 3"  (1.6 m)   Wt 271 lb 3.2 oz (123 kg)   SpO2 97% Comment: on RA  BMI 48.04 kg/m  Morbidly obese woman in no acute distress Alert and oriented x3 with no focal deficits Lungs clear bilaterally Cardiac regular rate and rhythm normal  S1-S2 Sternum stable, incision well-healed Chronic venous stasis changes in legs with 1+ edema  Diagnostic Tests: CHEST  2 VIEW  COMPARISON:  Single-view of the chest 08/20/2017 08/19/2017. PA and lateral chest 08/09/2017. CT chest 08/15/2017.  FINDINGS: Six intact median sternotomy wires are unchanged. There is marked cardiomegaly with vascular congestion. No consolidative process, pneumothorax or effusion. No acute bony abnormality.  IMPRESSION: Cardiomegaly and pulmonary vascular congestion. No acute abnormality.   Electronically Signed   By: Inge Rise M.D.   On: 09/13/2017 10:55 I personally reviewed the chest x-ray and concur with the findings noted above.  Impression: [name] is a 66 year old woman with numerous medical problems including morbid obesity, type 2 diabetes, paroxysmal atrial fibrillation, degenerative joint disease with multiple joint replacements, deconditioning, and coronary artery disease.  She underwent coronary bypass grafting x1 with a LIMA to LAD for an in-stent restenosis of a proximal LAD stent on 08/17/2017.  She is now about a month out from surgery and is doing well.  She is very happy with how she feels at this point.  She had a rough first couple of weeks.  She says her pain is much better she has not had any recurrent angina.  Her swelling is better as well.  She denies any shortness of breath.  Her mobility is still extremely limited.  I encouraged her to gradually start increasing her mobility adding 1-2 minutes at a time to her walking and doing that at least twice a day.  She should not lift anything over 10 pounds for another 2 weeks.  Plan: Return in 1 month with PA and lateral chest x-ray to check on progress  Melrose Nakayama, MD [facility] [phone]

## 2017-09-14 ENCOUNTER — Encounter: Payer: Self-pay | Admitting: *Deleted

## 2017-09-14 ENCOUNTER — Other Ambulatory Visit: Payer: Self-pay | Admitting: *Deleted

## 2017-09-14 ENCOUNTER — Ambulatory Visit: Payer: Self-pay | Admitting: *Deleted

## 2017-09-14 DIAGNOSIS — I2510 Atherosclerotic heart disease of native coronary artery without angina pectoris: Secondary | ICD-10-CM | POA: Diagnosis not present

## 2017-09-14 DIAGNOSIS — G4733 Obstructive sleep apnea (adult) (pediatric): Secondary | ICD-10-CM | POA: Diagnosis not present

## 2017-09-14 DIAGNOSIS — E1151 Type 2 diabetes mellitus with diabetic peripheral angiopathy without gangrene: Secondary | ICD-10-CM | POA: Diagnosis not present

## 2017-09-14 DIAGNOSIS — N183 Chronic kidney disease, stage 3 (moderate): Secondary | ICD-10-CM | POA: Diagnosis not present

## 2017-09-14 DIAGNOSIS — I5042 Chronic combined systolic (congestive) and diastolic (congestive) heart failure: Secondary | ICD-10-CM | POA: Diagnosis not present

## 2017-09-14 DIAGNOSIS — I480 Paroxysmal atrial fibrillation: Secondary | ICD-10-CM | POA: Diagnosis not present

## 2017-09-14 DIAGNOSIS — I130 Hypertensive heart and chronic kidney disease with heart failure and stage 1 through stage 4 chronic kidney disease, or unspecified chronic kidney disease: Secondary | ICD-10-CM | POA: Diagnosis not present

## 2017-09-14 DIAGNOSIS — D631 Anemia in chronic kidney disease: Secondary | ICD-10-CM | POA: Diagnosis not present

## 2017-09-14 DIAGNOSIS — E1122 Type 2 diabetes mellitus with diabetic chronic kidney disease: Secondary | ICD-10-CM | POA: Diagnosis not present

## 2017-09-14 NOTE — Patient Outreach (Signed)
[facility]) Care Management  09/14/2017  [name] 1950-10-06 [id number]   CSW was able to make contact with patient today via phone to confirm that she was discharged from the skilled nursing facility last Friday, September 09, 2017, prior to [place] driving out to [facility], [place] where she was receiving short-term rehabilitative services.  Patient reported that she is transitioning back home very well and that her home health nursing and physical therapy services have already started through [facility].  Patient was unable to identify any social work specific needs at present; therefore, CSW was able to confirm that patient has the correct contact information for CSW, encouraging patient to contact CSW directly if social work needs arise in the near future. CSW will perform a case closure on patient, as all goals of treatment have been met from social work standpoint and no additional social work needs have been identified at this time.  CSW will notify patient's RNCM with [place] [facility], of CSW's plans to close patient's case.  CSW will fax an update to patient's Primary Care Physician, Dr. [name] to ensure that they are aware of CSW's involvement with patient's plan of care.  CSW will submit a case closure request to Alycia Rossetti, Care Management Assistant with [place] [facility], in the form of an [employer].   THN CM Care Plan Problem One     Most Recent Value  Care Plan Problem One  Level of care issues.  Role Documenting the Problem One  Clinical Social Worker  Care Plan for Problem One  Active  [facility] Long Term Goal   Patient will have home health services and durable medical equipment in place, within the next 45 days.  THN Long Term Goal Start Date  08/26/17  THN Long Term Goal Met Date  09/14/17  Interventions for Problem One Long Term Goal  CSW will assist patient and  discharge planning coordinator at the skilled facility with arranging home health services and durable medical equipment for patient in the home.  THN CM Short Term Goal #1   Patient will decide on a home health agency of choice, with regards to home care services, within the next three weeks.  THN CM Short Term Goal #1 Start Date  08/26/17  [facility] CM Short Term Goal #1 Met Date  09/08/17  Interventions for Short Term Goal #1  CSW will provide patient with a list of home health agencies offering home care services.  THN CM Short Term Goal #2   Patient will decide on an agency of choice, with regards to durable medical equipment, within the next three weeks.  THN CM Short Term Goal #2 Start Date  08/26/17  [facility] CM Short Term Goal #2 Met Date  09/08/17  Interventions for Short Term Goal #2  CSW will provide patient with a list of agencies offering durable medical equipment.      Nat Christen, BSW, MSW, LCSW  Licensed Education officer, [facility]  [place] [address] [place], [place] [address] Physical Address-[address], [place], [place] [address] Toll Free Main # [phone] Fax # [phone] Cell # [phone]  Office # [phone] [email or web address]

## 2017-09-14 NOTE — Progress Notes (Signed)
Thank you, Betsy Pries

## 2017-09-15 ENCOUNTER — Other Ambulatory Visit: Payer: Self-pay | Admitting: *Deleted

## 2017-09-15 DIAGNOSIS — G4733 Obstructive sleep apnea (adult) (pediatric): Secondary | ICD-10-CM | POA: Diagnosis not present

## 2017-09-15 DIAGNOSIS — D631 Anemia in chronic kidney disease: Secondary | ICD-10-CM | POA: Diagnosis not present

## 2017-09-15 DIAGNOSIS — E1122 Type 2 diabetes mellitus with diabetic chronic kidney disease: Secondary | ICD-10-CM | POA: Diagnosis not present

## 2017-09-15 DIAGNOSIS — I5042 Chronic combined systolic (congestive) and diastolic (congestive) heart failure: Secondary | ICD-10-CM | POA: Diagnosis not present

## 2017-09-15 DIAGNOSIS — I130 Hypertensive heart and chronic kidney disease with heart failure and stage 1 through stage 4 chronic kidney disease, or unspecified chronic kidney disease: Secondary | ICD-10-CM | POA: Diagnosis not present

## 2017-09-15 DIAGNOSIS — E1151 Type 2 diabetes mellitus with diabetic peripheral angiopathy without gangrene: Secondary | ICD-10-CM | POA: Diagnosis not present

## 2017-09-15 DIAGNOSIS — I2510 Atherosclerotic heart disease of native coronary artery without angina pectoris: Secondary | ICD-10-CM | POA: Diagnosis not present

## 2017-09-15 DIAGNOSIS — I480 Paroxysmal atrial fibrillation: Secondary | ICD-10-CM | POA: Diagnosis not present

## 2017-09-15 DIAGNOSIS — N183 Chronic kidney disease, stage 3 (moderate): Secondary | ICD-10-CM | POA: Diagnosis not present

## 2017-09-15 NOTE — Patient Outreach (Signed)
[facility]) Care Management  09/15/2017  [name] 09/01/1951 [id number]   Referral received from LCSW as member was recently discharged from rehab facility after CABG.  She was admitted to hospital on 11/13 for chest pain, had open heart surgery on 11/21.  She was discharged to rehab on 11/27 and discharged home on 12/14.  Per chart, she also has history of atrial fibrillation, peripheral vascular disease, hypertension, diabetes, heart failure, and GERD.    Call placed to member for transition of care, identity verified.  This care manager introduces self and purpose of call.  She report she has been doing well.  Has the support of her family as well as home health for nursing and PT.  She report she has been compliant with medications and activity restrictions.  Patient was recently discharged from hospital and all medications have been reviewed.  She has already had follow up appointments with cardiologist and cardiac surgeon, has not seen primary MD yet.  She denies any urgent concerns at this time, provided with this care manager's contact information.  Advised to contact with questions.  Will follow up next week telephonically, initial home visit scheduled for within the next 2 weeks.  [facility] CM Care Plan Problem One     Most Recent Value  Care Plan Problem One  Risk of readmission related to complications of CABG as evidenced by recent surgery requiring short state at rehab facility  Role Documenting the Problem One  Care Management [place] for Problem One  Active  [facility] Long Term Goal   Member will not be readmitted to acute care facility within 31 days of discharge  [facility] Long Term Goal Start Date  09/15/17  Interventions for Problem One Long Term Goal  Discussed with member the importance of following discharge instructions, including follow up appointments, medications, diet, and home health involvement, to decrease the risk of readmission  THN CM  Short Term Goal #1   Member will report compliance with heart healthy diet over the next 4 weeks  THN CM Short Term Goal #1 Start Date  09/15/17  Interventions for Short Term Goal #1  Member educated on foods appropriate for heart health diet, such as foods low in salt and saturated fats.  THN CM Short Term Goal #2   Memer will keep and attend appointment with cardiologist within the next 2 weeks  THN CM Short Term Goal #2 Start Date  09/15/17  Interventions for Short Term Goal #2  Verified that member has appointment post facility discharge.  Reminded of date/time and importance of attending appointment.  Verified member has transportation.     [name], [place], MSN [place] [phone]

## 2017-09-16 ENCOUNTER — Ambulatory Visit: Payer: Self-pay | Admitting: *Deleted

## 2017-09-22 ENCOUNTER — Encounter: Payer: Self-pay | Admitting: *Deleted

## 2017-09-23 ENCOUNTER — Emergency Department (HOSPITAL_COMMUNITY): Payer: Medicare HMO

## 2017-09-23 ENCOUNTER — Encounter (HOSPITAL_COMMUNITY): Payer: Self-pay | Admitting: Emergency Medicine

## 2017-09-23 ENCOUNTER — Telehealth: Payer: Self-pay | Admitting: Cardiovascular Disease

## 2017-09-23 ENCOUNTER — Other Ambulatory Visit: Payer: Self-pay | Admitting: Cardiovascular Disease

## 2017-09-23 ENCOUNTER — Emergency Department (HOSPITAL_COMMUNITY)
Admission: EM | Admit: 2017-09-23 | Discharge: 2017-09-23 | Disposition: A | Discharge status: Home / Self Care | Payer: Medicare HMO | Attending: Emergency Medicine | Admitting: Emergency Medicine

## 2017-09-23 ENCOUNTER — Other Ambulatory Visit: Payer: Self-pay | Admitting: *Deleted

## 2017-09-23 DIAGNOSIS — I252 Old myocardial infarction: Secondary | ICD-10-CM | POA: Diagnosis not present

## 2017-09-23 DIAGNOSIS — E1151 Type 2 diabetes mellitus with diabetic peripheral angiopathy without gangrene: Secondary | ICD-10-CM | POA: Diagnosis not present

## 2017-09-23 DIAGNOSIS — Z955 Presence of coronary angioplasty implant and graft: Secondary | ICD-10-CM | POA: Insufficient documentation

## 2017-09-23 DIAGNOSIS — I130 Hypertensive heart and chronic kidney disease with heart failure and stage 1 through stage 4 chronic kidney disease, or unspecified chronic kidney disease: Secondary | ICD-10-CM | POA: Insufficient documentation

## 2017-09-23 DIAGNOSIS — N183 Chronic kidney disease, stage 3 (moderate): Secondary | ICD-10-CM | POA: Diagnosis not present

## 2017-09-23 DIAGNOSIS — R000 Tachycardia, unspecified: Secondary | ICD-10-CM | POA: Diagnosis not present

## 2017-09-23 DIAGNOSIS — E1122 Type 2 diabetes mellitus with diabetic chronic kidney disease: Secondary | ICD-10-CM | POA: Insufficient documentation

## 2017-09-23 DIAGNOSIS — Z96653 Presence of artificial knee joint, bilateral: Secondary | ICD-10-CM | POA: Insufficient documentation

## 2017-09-23 DIAGNOSIS — I5042 Chronic combined systolic (congestive) and diastolic (congestive) heart failure: Secondary | ICD-10-CM | POA: Insufficient documentation

## 2017-09-23 DIAGNOSIS — I480 Paroxysmal atrial fibrillation: Secondary | ICD-10-CM | POA: Diagnosis not present

## 2017-09-23 DIAGNOSIS — D631 Anemia in chronic kidney disease: Secondary | ICD-10-CM | POA: Diagnosis not present

## 2017-09-23 DIAGNOSIS — Z87891 Personal history of nicotine dependence: Secondary | ICD-10-CM | POA: Diagnosis not present

## 2017-09-23 DIAGNOSIS — Z794 Long term (current) use of insulin: Secondary | ICD-10-CM | POA: Insufficient documentation

## 2017-09-23 DIAGNOSIS — R030 Elevated blood-pressure reading, without diagnosis of hypertension: Secondary | ICD-10-CM | POA: Diagnosis not present

## 2017-09-23 DIAGNOSIS — Z951 Presence of aortocoronary bypass graft: Secondary | ICD-10-CM | POA: Diagnosis not present

## 2017-09-23 DIAGNOSIS — I2510 Atherosclerotic heart disease of native coronary artery without angina pectoris: Secondary | ICD-10-CM | POA: Insufficient documentation

## 2017-09-23 DIAGNOSIS — Z7901 Long term (current) use of anticoagulants: Secondary | ICD-10-CM | POA: Diagnosis not present

## 2017-09-23 DIAGNOSIS — Z85118 Personal history of other malignant neoplasm of bronchus and lung: Secondary | ICD-10-CM | POA: Insufficient documentation

## 2017-09-23 DIAGNOSIS — Z7982 Long term (current) use of aspirin: Secondary | ICD-10-CM | POA: Diagnosis not present

## 2017-09-23 DIAGNOSIS — G4733 Obstructive sleep apnea (adult) (pediatric): Secondary | ICD-10-CM | POA: Diagnosis not present

## 2017-09-23 LAB — BASIC METABOLIC PANEL
Anion gap: 11 (ref 5–15)
BUN: 20 mg/dL (ref 6–20)
CALCIUM: 9.3 mg/dL (ref 8.9–10.3)
CO2: 27 mmol/L (ref 22–32)
CREATININE: 1.2 mg/dL — AB (ref 0.44–1.00)
Chloride: 95 mmol/L — ABNORMAL LOW (ref 101–111)
GFR, EST AFRICAN AMERICAN: 53 mL/min — AB (ref >60)
GFR, EST NON AFRICAN AMERICAN: 46 mL/min — AB (ref >60)
Glucose, Bld: 240 mg/dL — ABNORMAL HIGH (ref 65–99)
Potassium: 3.5 mmol/L (ref 3.5–5.1)
SODIUM: 133 mmol/L — AB (ref 135–145)

## 2017-09-23 LAB — URINALYSIS, ROUTINE W REFLEX MICROSCOPIC
BILIRUBIN URINE: NEGATIVE
GLUCOSE, UA: NEGATIVE mg/dL
KETONES UR: NEGATIVE mg/dL
LEUKOCYTES UA: NEGATIVE
Nitrite: NEGATIVE
PH: 6 (ref 5.0–8.0)
PROTEIN: NEGATIVE mg/dL
Specific Gravity, Urine: 1.01 (ref 1.005–1.030)
WBC UA: NONE SEEN WBC/hpf (ref 0–5)

## 2017-09-23 LAB — CBC WITH DIFFERENTIAL/PLATELET
BASOS PCT: 0 %
Basophils Absolute: 0 10*3/uL (ref 0.0–0.1)
EOS ABS: 0.1 10*3/uL (ref 0.0–0.7)
EOS PCT: 1 %
HCT: 32.7 % — ABNORMAL LOW (ref 36.0–46.0)
Hemoglobin: 10.3 g/dL — ABNORMAL LOW (ref 12.0–15.0)
LYMPHS ABS: 1.3 10*3/uL (ref 0.7–4.0)
Lymphocytes Relative: 12 %
MCH: 27 pg (ref 26.0–34.0)
MCHC: 31.5 g/dL (ref 30.0–36.0)
MCV: 85.6 fL (ref 78.0–100.0)
MONOS PCT: 6 %
Monocytes Absolute: 0.6 10*3/uL (ref 0.1–1.0)
NEUTROS PCT: 81 %
Neutro Abs: 8.3 10*3/uL — ABNORMAL HIGH (ref 1.7–7.7)
PLATELETS: 290 10*3/uL (ref 150–400)
RBC: 3.82 MIL/uL — ABNORMAL LOW (ref 3.87–5.11)
RDW: 14.1 % (ref 11.5–15.5)
WBC: 10.2 10*3/uL (ref 4.0–10.5)

## 2017-09-23 LAB — TROPONIN I

## 2017-09-23 LAB — BRAIN NATRIURETIC PEPTIDE: B NATRIURETIC PEPTIDE 5: 136.1 pg/mL — AB (ref 0.0–100.0)

## 2017-09-23 MED ORDER — OXYCODONE-ACETAMINOPHEN 5-325 MG PO TABS
1.0000 | ORAL_TABLET | Freq: Once | ORAL | Status: AC
  Administered 2017-09-23: 1 via ORAL
  Filled 2017-09-23: qty 1

## 2017-09-23 NOTE — ED Notes (Signed)
ED Provider at bedside. 

## 2017-09-23 NOTE — Telephone Encounter (Signed)
New Message  [place] from [facility] call to inform pts heart rate is 114. Please call back to discuss

## 2017-09-23 NOTE — Patient Outreach (Signed)
[facility]) Care Management  09/23/2017  [name] 09-Jan-1951 [id number]   Weekly transition of care call placed to member. She report she is doing well, denies any chest pain or discomfort.  Denies any signs/symptoms of infection.  Report she has been taking medications as prescribed, took last Lasix today.  State she does not have any more refills.  She has appointment with cardiologist next week, but advised to contact office today to request refill.  Verbalizes understanding.  State she has not started weighing herself, report she was not told to perform daily weights.  Does not have a scale, but state she will purchase one this weekend.  State she has continued to be active with home health and watching her sodium intake.  Denies any urgent concerns at this time, home visit scheduled for within the next 2 weeks.  Will have covering care manager follow up telephonically next week.   THN CM Care Plan Problem One     Most Recent Value  Care Plan Problem One  Risk of readmission related to complications of CABG as evidenced by recent surgery requiring short state at rehab facility  Role Documenting the Problem One  Care Management [place] for Problem One  Active  [facility] Long Term Goal   Member will not be readmitted to acute care facility within 31 days of discharge  [facility] Long Term Goal Start Date  09/15/17  Interventions for Problem One Long Term Goal  Complications of open heart surgery discussed.  Educated on appropriate care plan for CAD and heart failure.  Educated on importance of weighing self to monitor fluid daily status  THN CM Short Term Goal #1   Member will report compliance with heart healthy diet over the next 4 weeks  THN CM Short Term Goal #1 Start Date  09/15/17  Interventions for Short Term Goal #1  Member educated on effect of foods high in sodium and fat/cholesterol on the heart.    THN CM Short Term Goal #2   Memer will keep and attend  appointment with cardiologist within the next 2 weeks  THN CM Short Term Goal #2 Start Date  09/15/17  Interventions for Short Term Goal #2  Confirmed member has transportation to appointment.  Edcuated on importance of follow up with cardiology to continue to develop plan of care for chronic health conditions     [name], Therapist, sports, MSN [place] Manager [phone]

## 2017-09-23 NOTE — Telephone Encounter (Signed)
Pt calling requesting a refill on furosemide. Pt would like for medication to be sent to [facility]. Please address

## 2017-09-23 NOTE — ED Notes (Signed)
Pt verbalizes understanding of d/c instructions. Pt taken out to lobby in wheelchair with all belongings.

## 2017-09-23 NOTE — ED Triage Notes (Signed)
Pt arrives via [facility] from home, hx of recent bipass 5 wks ago, home health nurse today found pts hr to be 130. Ems reports pt had initial HR of 130 with them, bp of 216/110, en route pts HR decreased to 60s-70s and bp 136/50. Pt denies pain or sob. Pt a/ox4, nad.

## 2017-09-23 NOTE — ED Notes (Signed)
Unable to obtain all labs with iv start

## 2017-09-23 NOTE — ED Provider Notes (Signed)
[facility] EMERGENCY DEPARTMENT Provider Note   CSN: [id number] Arrival date & time: 09/23/17  1759     History   Chief Complaint Chief Complaint  Patient presents with  . Tachycardia    HPI [name] is a 66 y.o. female.  The history is provided by the patient, the EMS personnel and medical records.  Illness  This is a new problem. The current episode started 1 to 2 hours ago. Episode frequency: Single episode. The problem has been resolved. Pertinent negatives include no chest pain, no abdominal pain, no headaches and no shortness of breath. Nothing aggravates the symptoms. Nothing relieves the symptoms. She has tried nothing (Nothing needed) for the symptoms.    Past Medical History:  Diagnosis Date  . Arthritis    "knees" (02/05/2016)  . Cancer ([place]) 5/10   LUL Vats   . Coronary artery disease   . GERD (gastroesophageal reflux disease)    tx meds  . H/O hiatal hernia   . History of blood transfusion "several"   last april 2015 s/p knee surgery (02/05/2016)  . Hypercholesteremia   . Hypertension   . Lung cancer ([place])    2010, surgery 18% left lung no radiation or chemo  . Non-STEMI (non-ST elevated myocardial infarction) ([place]) 04/07/2012   See cath results below  . Paroxysmal atrial fibrillation ([place]) 04/07/2012   On Warfarin  . Peripheral vascular disease ([place]) 8/12   Lt SFA PTA  . Presence of stent in LAD coronary artery 04/07/2012   Xience Expedition DES 2.75 mm x 18 mm (dilated to 3.0 mm)  . Sleep apnea    does not wear CPAP  . Type II diabetes mellitus (HCC)    insulin dependent    Patient Active Problem List   Diagnosis Date Noted  . S/P CABG x 1 08/17/2017  . Abnormal nuclear stress test 02/05/2016  . Chest pain with high risk for cardiac etiology 02/05/2016  . CAD S/P percutaneous coronary angioplasty 02/05/2016  . GERD (gastroesophageal reflux disease) 01/06/2016  . CKD (chronic kidney disease) stage 3, GFR 30-59 ml/min  (HCC) 01/06/2016  . Chronic combined systolic and diastolic heart failure ([place]) 06/28/2015  . Esophageal reflux 02/15/2014  . Anemia 01/17/2014  . S/P left TK revision 12/31/2013  . Knee osteomyelitis, left ([place]) 11/21/2013  . Foreign body of knee with infection 11/18/2013  . Unspecified constipation 11/07/2013  . Expected blood loss anemia 11/06/2013  . Left knee spacer, following removal of joint prosthesis 11/05/2013  . DM (diabetes mellitus), type 2 with peripheral vascular complications ([place]) 32/95/1884  . Onychomycosis 10/23/2013  . Pain, foot 10/23/2013  . DJD (degenerative joint disease) of knee 04/10/2013  . Vaginal atrophy 01/01/2013  . OAB (overactive bladder) 01/01/2013  . Osteopenia 01/01/2013  . Chest pain 11/28/2012  . Palpitations 11/22/2012  . Dyslipidemia 11/22/2012  . Chronic anticoagulation 11/22/2012  . Morbid obesity ([place]) 07/11/2012  . Trimalleolar fracture 07/07/2012  . CAD s/p LAD DES 2013, 2015 and 2017 04/08/2012  . ICM, EF 40-45% cath 04/07/12- improved to 50-60% by echo Oct 2013 04/08/2012  . Paroxysmal atrial fibrillation 04/07/2012  . NSTEMI (non-ST elevated myocardial infarction)- 7/13 04/07/2012  . Unstable angina ([place]) 04/07/2012  . PVD (peripheral vascular disease), Lt SFA PTA Aug 2012 04/07/2012  . Cancer of left lung parenchyma ([place]) 01/13/2009  . Insulin dependent diabetes mellitus ([place]) 11/27/2008  . Essential hypertension 11/27/2008    Past Surgical History:  Procedure Laterality Date  . ABDOMINAL HYSTERECTOMY  1990  . BREAST EXCISIONAL BIOPSY Right   . CARDIAC CATHETERIZATION  11/04/2008   patent RCA, LM, and Circ, nl EF  . CARDIAC CATHETERIZATION  02/20/2002   patent coronaries with the only abnormality being a smooth luminla irregularity in the mid intermediate ramus branch no felt to be hemodynamically significant, nl LV  . CARDIAC CATHETERIZATION N/A 02/05/2016   Procedure: Left Heart Cath and Coronary Angiography;  Surgeon: Leonie Man, MD;  Location: [facility];  Service: Cardiovascular;  Laterality: N/A;  . CARDIAC CATHETERIZATION N/A 02/05/2016   Procedure: Coronary Stent Intervention;  Surgeon: Leonie Man, MD;  Location: [facility];  Service: Cardiovascular;  Laterality: N/A;  . CATARACT EXTRACTION W/ INTRAOCULAR LENS  IMPLANT, BILATERAL  2013  . CORONARY ANGIOPLASTY WITH STENT PLACEMENT  04/07/2012   Xience Expedition DES 2.80mm x 18 mm (dilated to 3.0 mm) to the prox LAD   . CORONARY ANGIOPLASTY WITH STENT PLACEMENT  2013; 2015  . CORONARY ARTERY BYPASS GRAFT N/A 08/17/2017   Procedure: CORONARY ARTERY BYPASS GRAFTING (CABG) TIMES 1 USING LEFT INTERNAL MAMMARY ARTERY;  Surgeon: Melrose Nakayama, MD;  Location: [facility];  Service: Open Heart Surgery;  Laterality: N/A;  . DILATION AND CURETTAGE OF UTERUS  <1990  . ESOPHAGOGASTRODUODENOSCOPY N/A 05/23/2015   Procedure: ESOPHAGOGASTRODUODENOSCOPY (EGD);  Surgeon: Carol Ada, MD;  Location: [facility];  Service: Endoscopy;  Laterality: N/A;  . ESOPHAGOGASTRODUODENOSCOPY (EGD) WITH PROPOFOL N/A 06/13/2015   Procedure: ESOPHAGOGASTRODUODENOSCOPY (EGD) WITH PROPOFOL;  Surgeon: Carol Ada, MD;  Location: [facility];  Service: Endoscopy;  Laterality: N/A;  . FRACTURE SURGERY    . HAMMER TOE SURGERY Bilateral 1993   with screws, on screw removed  . JOINT REPLACEMENT    . KNEE ARTHROSCOPY Bilateral    left x2, right x1  . LAPAROSCOPIC CHOLECYSTECTOMY    . LEFT HEART CATH AND CORONARY ANGIOGRAPHY N/A 08/11/2017   Procedure: LEFT HEART CATH AND CORONARY ANGIOGRAPHY;  Surgeon: Martinique, Peter M, MD;  Location: [facility];  Service: Cardiovascular;  Laterality: N/A;  . LEFT HEART CATHETERIZATION WITH CORONARY ANGIOGRAM N/A 04/07/2012   Procedure: LEFT HEART CATHETERIZATION WITH CORONARY ANGIOGRAM;  Surgeon: [name], MD;  Location: [facility] CATH LAB;  Service: Cardiovascular;  Laterality: N/A;  . LEFT HEART CATHETERIZATION WITH CORONARY  ANGIOGRAM N/A 05/27/2014   Procedure: LEFT HEART CATHETERIZATION WITH CORONARY ANGIOGRAM;  Surgeon: [name], MD; LAD 99% ISR, CFX 50-60%, RCA (dominant) no sig dz, EF 60%  . LOWER EXTREMITY ANGIOGRAM  05/17/11   directional atherectomy to the prox L SFA using a LX Man TurboHawk, ballooned with a [employer] balloon   . ORIF ANKLE FRACTURE  07/09/2012   Procedure: OPEN REDUCTION INTERNAL FIXATION (ORIF) ANKLE FRACTURE;  Surgeon: Meredith Pel, MD;  Location: [facility];  Service: Orthopedics;  Laterality: Left;  open reduction internal fixation trimalleolar ankle fracture medial malleolous fixation  . PERCUTANEOUS CORONARY STENT INTERVENTION (PCI-S) N/A 04/07/2012   Procedure: PERCUTANEOUS CORONARY STENT INTERVENTION (PCI-S);  Surgeon: [name], MD;  Location[facility] C[facility] LAB;  Service: Cardiovascular;  Laterality: N/A;  . PERCUTANEOUS CORONARY STENT INTERVENTION (PCI-S)  05/27/2014   Procedure: PERCUTANEOUS CORONARY STENT INTERVENTION (PCI-S);  Surgeon: [name], MD; 3 mm x 12 mm long Xience Xpedition DES to the proximal LAD  . SAVORY DILATION N/A 06/13/2015   Procedure: SAVORY DILATION;  Surgeon: Carol Ada, MD;  Location[facility];  Service: Endoscopy;  Laterality: N/A;  . TEE  WITHOUT CARDIOVERSION N/A 08/17/2017   Procedure: TRANSESOPHAGEAL ECHOCARDIOGRAM (TEE);  Surgeon: Melrose Nakayama, MD;  Location: [facility];  Service: Open Heart Surgery;  Laterality: N/A;  . TONSILLECTOMY    . TOTAL KNEE ARTHROPLASTY Left 2003  . TOTAL KNEE REVISION Left 11/05/2013   Procedure: LEFT TOTAL KNEE RESECTION;  Surgeon: Mauri Pole, MD;  Location: [facility];  Service: Orthopedics;  Laterality: Left;  . TOTAL KNEE REVISION Left 2007   opened in 2006 and cleaned out, 2007 revision  . TOTAL KNEE REVISION Left 12/31/2013   Procedure: RE-INPLANTATION LEFT TOTAL KNEE ;  Surgeon: Mauri Pole, MD;  Location: [facility];  Service: Orthopedics;  Laterality: Left;  Mar[facility] Kitchen VIDEO ASSISTED THORACOSCOPY  (VATS)/ LOBECTOMY  01/2009    OB History    Gravida Para Term Preterm AB Living   1 1       1    SAB TAB Ectopic Multiple Live Births                   Home Medications    Prior to Admission medications   Medication Sig Start Date End Date Taking? Authorizing Provider  aspirin EC 81 MG tablet Take 81 mg by mouth daily.   Yes [provider]  furosemide (LASIX) 40 MG tablet Take 40 mg by mouth daily.   Yes [provider]  glimepiride (AMARYL) 2 MG tablet Take 2 mg by mouth daily with breakfast.   Yes [provider]  insulin NPH-regular Human (NOVOLIN 70/30) (70-30) 100 UNIT/ML injection Inject 30-34 Units into the skin 2 (two) times daily with a meal. Prior to surgery, she took 42 units in the morning 20 units in the evening. As of 08/23/2017, she is on 20 units in am and 20 units in the pm. Her Insulin gradually needs to be increased to pre surgery doses as glucose levels allow. Patient taking differently: Inject 20 Units into the skin 2 (two) times daily before a meal.  08/23/17  Yes Tacy Dura, Donielle M, PA-C  lisinopril-hydrochlorothiazide (PRINZIDE,ZESTORETIC) 20-25 MG per tablet Take 1 tablet by mouth daily.   Yes [provider]  metoprolol tartrate (LOPRESSOR) 25 MG tablet Take 1 tablet (25 mg total) by mouth 2 (two) times daily. 05/05/17  Yes Croitoru, Mihai, MD  pantoprazole (PROTONIX) 40 MG tablet TAKE 1 TABLET BY MOUTH EVERY DAY Patient taking differently: Take 40 mg by mouth once a day 08/23/17  Yes Croitoru, Mihai, MD  simvastatin (ZOCOR) 40 MG tablet Take 40 mg by mouth at bedtime.    Yes [provider]  Vitamin D, Cholecalciferol, 1000 units TABS Take 1,000 Units by mouth every morning.    Yes [provider]  warfarin (COUMADIN) 7.5 MG tablet Take 3.75-7.5 mg by mouth See admin instructions. 7.5 mg in the evening on Sun/Tues/Thurs/Sat and 3.75 mg on Mon/Wed/Fri   Yes [provider]  oxyCODONE (OXY  IR/ROXICODONE) 5 MG immediate release tablet Take 5 mg by mouth every 4-6 hours PRN severe pain Patient not taking: Reported on 09/23/2017 08/23/17   Nani Skillern, PA-C  warfarin (COUMADIN) 5 MG tablet Take 5 mg by mouth or as directed. Patient not taking: Reported on 09/23/2017 08/23/17   Nani Skillern, PA-C    Family History Family History  Problem Relation Age of Onset  . Hypertension Mother   . Coronary artery disease Brother   . Hypertension Sister     Social History Social History   Tobacco Use  . Smoking  status: Former Smoker    Packs/day: 1.00    Years: 20.00    Pack years: 20.00    Types: Cigarettes    Last attempt to quit: 09/27/1984    Years since quitting: 33.0  . Smokeless tobacco: Never Used  Substance Use Topics  . Alcohol use: No  . Drug use: No     Allergies   Levofloxacin and Morphine and related   Review of Systems Review of Systems  Constitutional: Negative for chills and fever.  HENT: Negative for ear pain and sore throat.   Eyes: Negative for pain and visual disturbance.  Respiratory: Negative for cough, shortness of breath and wheezing.   Cardiovascular: Negative for chest pain, palpitations and leg swelling.  Gastrointestinal: Negative for abdominal pain and vomiting.  Genitourinary: Negative for dysuria.       Right groin pain  Musculoskeletal: Negative for arthralgias and back pain.  Skin: Negative for rash.  Neurological: Negative for seizures, syncope and headaches.  All other systems reviewed and are negative.    Physical Exam Updated Vital Signs BP (!) 114/54   Pulse 89   Resp (!) 24   SpO2 100%   Physical Exam  Constitutional: She is oriented to person, place, and time. She appears well-developed and well-nourished. No distress.  HENT:  Head: Normocephalic and atraumatic.  Eyes: Conjunctivae are normal.  Neck: Neck supple.  Cardiovascular: Normal rate and regular rhythm.  No murmur  heard. Pulmonary/Chest: Effort normal and breath sounds normal. No respiratory distress.  Abdominal: Soft. There is no tenderness.  Musculoskeletal: Normal range of motion. She exhibits no edema.  Neurological: She is alert and oriented to person, place, and time.  Skin: Skin is warm and dry.  Psychiatric: She has a normal mood and affect.  Nursing note and vitals reviewed.    ED Treatments / Results  Labs (all labs ordered are listed, but only abnormal results are displayed) Labs Reviewed  CBC WITH DIFFERENTIAL/PLATELET - Abnormal; Notable for the following components:      Result Value   RBC 3.82 (*)    Hemoglobin 10.3 (*)    HCT 32.7 (*)    Neutro Abs 8.3 (*)    All other components within normal limits  BASIC METABOLIC PANEL - Abnormal; Notable for the following components:   Sodium 133 (*)    Chloride 95 (*)    Glucose, Bld 240 (*)    Creatinine, Ser 1.20 (*)    GFR calc non Af Amer 46 (*)    GFR calc Af Amer 53 (*)    All other components within normal limits  BRAIN NATRIURETIC PEPTIDE - Abnormal; Notable for the following components:   B Natriuretic Peptide 136.1 (*)    All other components within normal limits  TROPONIN I  URINALYSIS, ROUTINE W REFLEX MICROSCOPIC    EKG  EKG Interpretation  Date/Time:  Friday September 23 2017 18:35:02 EST Ventricular Rate:  88 PR Interval:    QRS Duration: 166 QT Interval:  421 QTC Calculation: 510 R Axis:   -32 Text Interpretation:  Sinus rhythm IVCD, consider atypical RBBB LVH with IVCD and secondary repol abnrm Prolonged QT interval IVCD new from 12/4 Confirmed by Isla Pence ([id number]) on 09/23/2017 6:49:28 PM       Radiology Dg Chest Portable 1 View  Result Date: 09/23/2017 CLINICAL DATA:  66 year old female with tachycardia. EXAM: PORTABLE CHEST 1 VIEW COMPARISON:  Chest radiograph dated 09/13/2017 FINDINGS: There is shallow inspiration with bibasilar atelectasis. No focal consolidation,  pleural effusion, or  pneumothorax. There is mild cardiomegaly with mild vascular congestion. Median sternotomy wires and CABG vascular clips. Coronary vascular stent noted. No acute osseous pathology. IMPRESSION: 1. Mild cardiomegaly and vascular congestion. 2. No focal consolidation. Electronically Signed   By: Anner Crete M.D.   On: 09/23/2017 19:27    Procedures Procedures (including critical care time)  Medications Ordered in ED Medications  oxyCODONE-acetaminophen (PERCOCET/ROXICET) 5-325 MG per tablet 1 tablet (1 tablet Oral Given 09/23/17 2140)     Initial Impression / Assessment and Plan / ED Course  I have reviewed the triage vital signs and the nursing notes.  Pertinent labs & imaging results that were available during my care of the patient were reviewed by me and considered in my medical decision making (see chart for details).     Pt with h/o CAD (s/p CABD 5wks ago), HTN, HLD, DM2, GERD presents with tachcycardia. Pt says she had a home health nurse visit today, and as part of her assessment, she noted the Pt to be tachycardic to 114bpm; the nurse called the Pt's doctor's office & told them she was sending the Pt to the ED. When EMS arrived, the Pt's HR had reportedly increased to 130bpm, but then returned to normal w/o intervention; Pt says she felt "funny" during the episode, but denies HA, lightheadedness/dizziness, pre-syncope, CP, SOB, N/V. Says she has been in her normal state of health, and was told at two post-op appointments that she is healing well.  VS & exam as above. EKG: NSR @ 88bpm w/LVH, IVCD; prolonged QTc (514ms) and RSR' morphology in I & aVL no present on tracing from 12/4. CXR w/mild cardiomegaly & vascular congestion. Labs remarkable for BNP 136, undetectable troponin.  Discussed the case with CTS (Dr. Cyndia Bent) who said the Pt was suitable for d/c home.  Explained all results to the Pt. Will discharge the Pt home. Recommending follow-up with CTS. ED return precautions  provided. Pt acknowledged understanding of, and concurrence with the plan. All questions answered to her satisfaction. In stable condition at the time of discharge.  Final Clinical Impressions(s) / ED Diagnoses   Final diagnoses:  Tachycardia    ED Discharge Orders    None       Jenny Reichmann, MD 09/23/17 [id number]    Isla Pence, MD 09/23/17 2230

## 2017-09-23 NOTE — Telephone Encounter (Signed)
Returned call regarding tachycardia. Spoke w Colletta [place], [facility] who voices she is at patient home for nurse visit today. She obtained reading of HR 114.  Did not report a BP, however states that in addition to the fast heart rate, the patient feels weak, is having some right sided chest discomfort, and experiencing visual changes.  She advised the patient to go to ED, and is arranging transport. I stated affirmation that ED visit was best recommendation. Informed her I would relay information to Dr. Sallyanne Kuster.

## 2017-09-25 ENCOUNTER — Encounter: Payer: Self-pay | Admitting: *Deleted

## 2017-09-26 DIAGNOSIS — G4733 Obstructive sleep apnea (adult) (pediatric): Secondary | ICD-10-CM | POA: Diagnosis not present

## 2017-09-26 DIAGNOSIS — D631 Anemia in chronic kidney disease: Secondary | ICD-10-CM | POA: Diagnosis not present

## 2017-09-26 DIAGNOSIS — I130 Hypertensive heart and chronic kidney disease with heart failure and stage 1 through stage 4 chronic kidney disease, or unspecified chronic kidney disease: Secondary | ICD-10-CM | POA: Diagnosis not present

## 2017-09-26 DIAGNOSIS — E1151 Type 2 diabetes mellitus with diabetic peripheral angiopathy without gangrene: Secondary | ICD-10-CM | POA: Diagnosis not present

## 2017-09-26 DIAGNOSIS — I5042 Chronic combined systolic (congestive) and diastolic (congestive) heart failure: Secondary | ICD-10-CM | POA: Diagnosis not present

## 2017-09-26 DIAGNOSIS — N183 Chronic kidney disease, stage 3 (moderate): Secondary | ICD-10-CM | POA: Diagnosis not present

## 2017-09-26 DIAGNOSIS — I480 Paroxysmal atrial fibrillation: Secondary | ICD-10-CM | POA: Diagnosis not present

## 2017-09-26 DIAGNOSIS — E1122 Type 2 diabetes mellitus with diabetic chronic kidney disease: Secondary | ICD-10-CM | POA: Diagnosis not present

## 2017-09-26 DIAGNOSIS — I2510 Atherosclerotic heart disease of native coronary artery without angina pectoris: Secondary | ICD-10-CM | POA: Diagnosis not present

## 2017-09-27 DIAGNOSIS — I480 Paroxysmal atrial fibrillation: Secondary | ICD-10-CM | POA: Diagnosis not present

## 2017-09-27 DIAGNOSIS — N183 Chronic kidney disease, stage 3 (moderate): Secondary | ICD-10-CM | POA: Diagnosis not present

## 2017-09-27 DIAGNOSIS — I2510 Atherosclerotic heart disease of native coronary artery without angina pectoris: Secondary | ICD-10-CM | POA: Diagnosis not present

## 2017-09-27 DIAGNOSIS — D631 Anemia in chronic kidney disease: Secondary | ICD-10-CM | POA: Diagnosis not present

## 2017-09-27 DIAGNOSIS — G4733 Obstructive sleep apnea (adult) (pediatric): Secondary | ICD-10-CM | POA: Diagnosis not present

## 2017-09-27 DIAGNOSIS — I5042 Chronic combined systolic (congestive) and diastolic (congestive) heart failure: Secondary | ICD-10-CM | POA: Diagnosis not present

## 2017-09-27 DIAGNOSIS — E1122 Type 2 diabetes mellitus with diabetic chronic kidney disease: Secondary | ICD-10-CM | POA: Diagnosis not present

## 2017-09-27 DIAGNOSIS — E1151 Type 2 diabetes mellitus with diabetic peripheral angiopathy without gangrene: Secondary | ICD-10-CM | POA: Diagnosis not present

## 2017-09-27 DIAGNOSIS — I130 Hypertensive heart and chronic kidney disease with heart failure and stage 1 through stage 4 chronic kidney disease, or unspecified chronic kidney disease: Secondary | ICD-10-CM | POA: Diagnosis not present

## 2017-09-28 ENCOUNTER — Other Ambulatory Visit: Payer: Self-pay | Admitting: *Deleted

## 2017-09-28 DIAGNOSIS — N183 Chronic kidney disease, stage 3 (moderate): Secondary | ICD-10-CM | POA: Diagnosis not present

## 2017-09-28 DIAGNOSIS — E1151 Type 2 diabetes mellitus with diabetic peripheral angiopathy without gangrene: Secondary | ICD-10-CM | POA: Diagnosis not present

## 2017-09-28 DIAGNOSIS — I5042 Chronic combined systolic (congestive) and diastolic (congestive) heart failure: Secondary | ICD-10-CM | POA: Diagnosis not present

## 2017-09-28 DIAGNOSIS — E1122 Type 2 diabetes mellitus with diabetic chronic kidney disease: Secondary | ICD-10-CM | POA: Diagnosis not present

## 2017-09-28 DIAGNOSIS — I130 Hypertensive heart and chronic kidney disease with heart failure and stage 1 through stage 4 chronic kidney disease, or unspecified chronic kidney disease: Secondary | ICD-10-CM | POA: Diagnosis not present

## 2017-09-28 DIAGNOSIS — I480 Paroxysmal atrial fibrillation: Secondary | ICD-10-CM | POA: Diagnosis not present

## 2017-09-28 DIAGNOSIS — G4733 Obstructive sleep apnea (adult) (pediatric): Secondary | ICD-10-CM | POA: Diagnosis not present

## 2017-09-28 DIAGNOSIS — D631 Anemia in chronic kidney disease: Secondary | ICD-10-CM | POA: Diagnosis not present

## 2017-09-28 DIAGNOSIS — I2510 Atherosclerotic heart disease of native coronary artery without angina pectoris: Secondary | ICD-10-CM | POA: Diagnosis not present

## 2017-09-28 NOTE — Patient Outreach (Signed)
[name]) Care Management  09/28/2017  [name] 07-21-1951 [id number]    COVERING [name]  RN spoke with pt today and introduced case Freight forwarder as covering for [employer]. RN inquired on her recent ED visit on 12/28 as pt states it was tachycardia with no additional encountered symptoms. Also inquired on weights as pt states she will obtain with her paycheck tomorrow. RN strongly encouraged adherent and educated pt on the HF zones and the importance of daily weights related to fluid retention. Pt denies any swelling with no breathing issues and states she has an appointment with her CAD provider on 1/4.  Also discussed tachycardia and verified no additional symptom however if encountered pt is aware of what to do by contact her provider with such symptoms duration and intensity of her symptoms. Plan of care reviewed with goal and interventions adjusted accordingly. Will update [name],RN  accordingly on this pt.   [name], RN Care Management Coordinator [facility] Office [phone]

## 2017-09-29 ENCOUNTER — Ambulatory Visit: Payer: Self-pay | Admitting: *Deleted

## 2017-09-29 DIAGNOSIS — E1151 Type 2 diabetes mellitus with diabetic peripheral angiopathy without gangrene: Secondary | ICD-10-CM | POA: Diagnosis not present

## 2017-09-29 DIAGNOSIS — E1122 Type 2 diabetes mellitus with diabetic chronic kidney disease: Secondary | ICD-10-CM | POA: Diagnosis not present

## 2017-09-29 DIAGNOSIS — N183 Chronic kidney disease, stage 3 (moderate): Secondary | ICD-10-CM | POA: Diagnosis not present

## 2017-09-29 DIAGNOSIS — D631 Anemia in chronic kidney disease: Secondary | ICD-10-CM | POA: Diagnosis not present

## 2017-09-29 DIAGNOSIS — I5042 Chronic combined systolic (congestive) and diastolic (congestive) heart failure: Secondary | ICD-10-CM | POA: Diagnosis not present

## 2017-09-29 DIAGNOSIS — I130 Hypertensive heart and chronic kidney disease with heart failure and stage 1 through stage 4 chronic kidney disease, or unspecified chronic kidney disease: Secondary | ICD-10-CM | POA: Diagnosis not present

## 2017-09-29 DIAGNOSIS — I2510 Atherosclerotic heart disease of native coronary artery without angina pectoris: Secondary | ICD-10-CM | POA: Diagnosis not present

## 2017-09-29 DIAGNOSIS — G4733 Obstructive sleep apnea (adult) (pediatric): Secondary | ICD-10-CM | POA: Diagnosis not present

## 2017-09-29 DIAGNOSIS — I480 Paroxysmal atrial fibrillation: Secondary | ICD-10-CM | POA: Diagnosis not present

## 2017-09-30 ENCOUNTER — Ambulatory Visit: Payer: Medicare HMO | Admitting: Physician Assistant

## 2017-09-30 ENCOUNTER — Ambulatory Visit (INDEPENDENT_AMBULATORY_CARE_PROVIDER_SITE_OTHER): Payer: Medicare HMO | Admitting: Pharmacist Clinician (PhC)/ Clinical Pharmacy Specialist

## 2017-09-30 ENCOUNTER — Encounter: Payer: Self-pay | Admitting: Physician Assistant

## 2017-09-30 VITALS — BP 118/62 | HR 72 | Ht 63.0 in | Wt 270.4 lb

## 2017-09-30 DIAGNOSIS — I5042 Chronic combined systolic (congestive) and diastolic (congestive) heart failure: Secondary | ICD-10-CM | POA: Diagnosis not present

## 2017-09-30 DIAGNOSIS — I2510 Atherosclerotic heart disease of native coronary artery without angina pectoris: Secondary | ICD-10-CM

## 2017-09-30 DIAGNOSIS — Z7901 Long term (current) use of anticoagulants: Secondary | ICD-10-CM | POA: Diagnosis not present

## 2017-09-30 DIAGNOSIS — I480 Paroxysmal atrial fibrillation: Secondary | ICD-10-CM

## 2017-09-30 DIAGNOSIS — N183 Chronic kidney disease, stage 3 unspecified: Secondary | ICD-10-CM

## 2017-09-30 DIAGNOSIS — Z9861 Coronary angioplasty status: Secondary | ICD-10-CM

## 2017-09-30 DIAGNOSIS — D631 Anemia in chronic kidney disease: Secondary | ICD-10-CM | POA: Diagnosis not present

## 2017-09-30 DIAGNOSIS — I130 Hypertensive heart and chronic kidney disease with heart failure and stage 1 through stage 4 chronic kidney disease, or unspecified chronic kidney disease: Secondary | ICD-10-CM | POA: Diagnosis not present

## 2017-09-30 DIAGNOSIS — I5032 Chronic diastolic (congestive) heart failure: Secondary | ICD-10-CM

## 2017-09-30 DIAGNOSIS — K219 Gastro-esophageal reflux disease without esophagitis: Secondary | ICD-10-CM | POA: Diagnosis not present

## 2017-09-30 DIAGNOSIS — G4733 Obstructive sleep apnea (adult) (pediatric): Secondary | ICD-10-CM | POA: Diagnosis not present

## 2017-09-30 DIAGNOSIS — E1151 Type 2 diabetes mellitus with diabetic peripheral angiopathy without gangrene: Secondary | ICD-10-CM | POA: Diagnosis not present

## 2017-09-30 DIAGNOSIS — E1122 Type 2 diabetes mellitus with diabetic chronic kidney disease: Secondary | ICD-10-CM | POA: Diagnosis not present

## 2017-09-30 MED ORDER — POTASSIUM CHLORIDE ER 10 MEQ PO TBCR: 10.0000 meq | EXTENDED_RELEASE_TABLET | Freq: Every day | ORAL | 3 refills | Status: DC

## 2017-09-30 MED ORDER — FUROSEMIDE 40 MG PO TABS: 40.0000 mg | ORAL_TABLET | Freq: Every day | ORAL | 3 refills | Status: DC

## 2017-09-30 NOTE — Progress Notes (Signed)
Cardiology Office Note   Date:  10/02/2017   ID:  [name], DOB Nov 17, 1950, MRN [id number]  PCP:  Brandi Seashore, MD  Cardiologist: Dr. Sallyanne [name], 02/22/2017 Brandi Ferries, PA-C 08/30/2017  No chief complaint on file.   History of Present Illness: [name] is a 67 y.o. female with a history of  LAD stent with 2 episodes of re-stenosis>>CABG w/ LIMA-LAD 07/2017, HTN, HLD, T2DM, GERD with esophageal stricture, lung CA s/p LUL VATS, HH, PAF on Coumadin, NSTEMI 2013, OSA not on CPAP, RBBB  12/04 office visit, patient with volume overload.  Lasix increased to 80 mg daily for 1 week, BMET in 1 week, lisinopril HCTZ held, weight 273 pounds 12/18 TCTS visit, symptomatically improved, weight 271 pounds, follow-up in 1 month with an x-ray 12/28 ER visit for tachycardia, heart rate 114 and possibly 130 bpm, HR normalized prior to arrival in the ER, ECG sinus rhythm with a right bundle which is old, CXR w/ mild vasc congestion  [name] presents for cardiology follow up.  Pt HHRN was with her on 12/28 and noted her HR to be 111 and then 114. No symptoms. RN called the office and then called EMS as requested. HR was 130 by FD and EMS suggested tx. HR 137 by EMS, but down to 90 by arrival at the ER. HR remained good throughout the ER stay.  She has ordered a scale, is willing to use it once she gets it. Believes she will be able to manage brief wt gains with extra rx. Was d/c'd from rehab SNF with Lasix x 14 days, ran out 12/30.   Since then, no problems. She worked with PT today and was walking, HR 101 after that. No sx. Minimal LE edema, chronic orthopnea, denies PND  Pleased to learn her weight is down another lb.   She is concerned about the Protonix, side effects from long-term use, wonders if she can use something else.   She has a hx of esophageal stricture, takes all rx w/ applesauce.     Past Medical History:  Diagnosis Date  . Arthritis    "knees"  (02/05/2016)  . Cancer ([place]) 5/10   LUL Vats   . Coronary artery disease   . GERD (gastroesophageal reflux disease)    tx meds  . H/O hiatal hernia   . History of blood transfusion "several"   last april 2015 s/p knee surgery (02/05/2016)  . Hypercholesteremia   . Hypertension   . Lung cancer ([place])    2010, surgery 18% left lung no radiation or chemo  . Non-STEMI (non-ST elevated myocardial infarction) ([place]) 04/07/2012   See cath results below  . Paroxysmal atrial fibrillation ([place]) 04/07/2012   On Warfarin  . Peripheral vascular disease ([place]) 8/12   Lt SFA PTA  . Presence of stent in LAD coronary artery 04/07/2012   Xience Expedition DES 2.75 mm x 18 mm (dilated to 3.0 mm)  . Sleep apnea    does not wear CPAP  . Type II diabetes mellitus ([facility])    insulin dependent    Past Surgical History:  Procedure Laterality Date  . ABDOMINAL HYSTERECTOMY  1990  . BREAST EXCISIONAL BIOPSY Right   . CARDIAC CATHETERIZATION  11/04/2008   patent RCA, LM, and Circ, nl EF  . CARDIAC CATHETERIZATION  02/20/2002   patent coronaries with the only abnormality being a smooth luminla irregularity in the mid intermediate ramus branch no felt to be hemodynamically significant, nl  LV  . CARDIAC CATHETERIZATION N/A 02/05/2016   Procedure: Left Heart Cath and Coronary Angiography;  Surgeon: Leonie Man, MD;  Location: [facility];  Service: Cardiovascular;  Laterality: N/A;  . CARDIAC CATHETERIZATION N/A 02/05/2016   Procedure: Coronary Stent Intervention;  Surgeon: Leonie Man, MD;  Location: [facility];  Service: Cardiovascular;  Laterality: N/A;  . CATARACT EXTRACTION W/ INTRAOCULAR LENS  IMPLANT, BILATERAL  2013  . CORONARY ANGIOPLASTY WITH STENT PLACEMENT  04/07/2012   Xience Expedition DES 2.41mm x 18 mm (dilated to 3.0 mm) to the prox LAD   . CORONARY ANGIOPLASTY WITH STENT PLACEMENT  2013; 2015  . CORONARY ARTERY BYPASS GRAFT N/A 08/17/2017   Procedure: CORONARY ARTERY BYPASS  GRAFTING (CABG) TIMES 1 USING LEFT INTERNAL MAMMARY ARTERY;  Surgeon: Melrose Nakayama, MD;  Location: [facility];  Service: Open Heart Surgery;  Laterality: N/A;  . DILATION AND CURETTAGE OF UTERUS  <1990  . ESOPHAGOGASTRODUODENOSCOPY N/A 05/23/2015   Procedure: ESOPHAGOGASTRODUODENOSCOPY (EGD);  Surgeon: Carol Ada, MD;  Location: [facility];  Service: Endoscopy;  Laterality: N/A;  . ESOPHAGOGASTRODUODENOSCOPY (EGD) WITH PROPOFOL N/A 06/13/2015   Procedure: ESOPHAGOGASTRODUODENOSCOPY (EGD) WITH PROPOFOL;  Surgeon: Carol Ada, MD;  Location: [facility];  Service: Endoscopy;  Laterality: N/A;  . FRACTURE SURGERY    . HAMMER TOE SURGERY Bilateral 1993   with screws, on screw removed  . JOINT REPLACEMENT    . KNEE ARTHROSCOPY Bilateral    left x2, right x1  . LAPAROSCOPIC CHOLECYSTECTOMY    . LEFT HEART CATH AND CORONARY ANGIOGRAPHY N/A 08/11/2017   Procedure: LEFT HEART CATH AND CORONARY ANGIOGRAPHY;  Surgeon: Martinique, Peter M, MD;  Location: [facility];  Service: Cardiovascular;  Laterality: N/A;  . LEFT HEART CATHETERIZATION WITH CORONARY ANGIOGRAM N/A 04/07/2012   Procedure: LEFT HEART CATHETERIZATION WITH CORONARY ANGIOGRAM;  Surgeon: [name], MD;  Location: [facility];  Service: Cardiovascular;  Laterality: N/A;  . LEFT HEART CATHETERIZATION WITH CORONARY ANGIOGRAM N/A 05/27/2014   Procedure: LEFT HEART CATHETERIZATION WITH CORONARY ANGIOGRAM;  Surgeon: [name], MD; LAD 99% ISR, CFX 50-60%, RCA (dominant) no sig dz, EF 60%  . LOWER EXTREMITY ANGIOGRAM  05/17/11   directional atherectomy to the prox L SFA using a LX Man TurboHawk, ballooned with a [employer] balloon   . ORIF ANKLE FRACTURE  07/09/2012   Procedure: OPEN REDUCTION INTERNAL FIXATION (ORIF) ANKLE FRACTURE;  Surgeon: Meredith Pel, MD;  Location: [facility];  Service: Orthopedics;  Laterality: Left;  open reduction internal fixation trimalleolar ankle fracture medial malleolous fixation  .  PERCUTANEOUS CORONARY STENT INTERVENTION (PCI-S) N/A 04/07/2012   Procedure: PERCUTANEOUS CORONARY STENT INTERVENTION (PCI-S);  Surgeon: [name], MD;  Location: [facility];  Service: Cardiovascular;  Laterality: N/A;  . PERCUTANEOUS CORONARY STENT INTERVENTION (PCI-S)  05/27/2014   Procedure: PERCUTANEOUS CORONARY STENT INTERVENTION (PCI-S);  Surgeon: [name], MD; 3 mm x 12 mm long Xience Xpedition DES to the proximal LAD  . SAVORY DILATION N/A 06/13/2015   Procedure: SAVORY DILATION;  Surgeon: Carol Ada, MD;  Location: [facility];  Service: Endoscopy;  Laterality: N/A;  . TEE WITHOUT CARDIOVERSION N/A 08/17/2017   Procedure: TRANSESOPHAGEAL ECHOCARDIOGRAM (TEE);  Surgeon: Melrose Nakayama, MD;  Location: [facility];  Service: Open Heart Surgery;  Laterality: N/A;  . TONSILLECTOMY    . TOTAL KNEE ARTHROPLASTY Left 2003  . TOTAL KNEE REVISION Left 11/05/2013   Procedure: LEFT TOTAL KNEE RESECTION;  Surgeon: Mauri Pole, MD;  Location: [facility];  Service: Orthopedics;  Laterality: Left;  . TOTAL KNEE REVISION Left 2007   opened in 2006 and cleaned out, 2007 revision  . TOTAL KNEE REVISION Left 12/31/2013   Procedure: RE-INPLANTATION LEFT TOTAL KNEE ;  Surgeon: Mauri Pole, MD;  Location: [facility];  Service: Orthopedics;  Laterality: Left;  Marland Kitchen VIDEO ASSISTED THORACOSCOPY (VATS)/ LOBECTOMY  01/2009    Current Outpatient Medications  Medication Sig Dispense Refill  . aspirin EC 81 MG tablet Take 81 mg by mouth daily.    . furosemide (LASIX) 40 MG tablet Take 40 mg by mouth daily.    Marland Kitchen glimepiride (AMARYL) 2 MG tablet Take 2 mg by mouth daily with breakfast.    . insulin NPH-regular Human (NOVOLIN 70/30) (70-30) 100 UNIT/ML injection Inject 30-34 Units into the skin 2 (two) times daily with a meal. Prior to surgery, she took 42 units in the morning 20 units in the evening. As of 08/23/2017, she is on 20 units in am and 20 units in the pm. Her Insulin gradually needs to be increased  to pre surgery doses as glucose levels allow. (Patient taking differently: Inject 20 Units into the skin 2 (two) times daily before a meal. ) 10 mL 11  . lisinopril-hydrochlorothiazide (PRINZIDE,ZESTORETIC) 20-25 MG per tablet Take 1 tablet by mouth daily.    . metoprolol tartrate (LOPRESSOR) 25 MG tablet Take 1 tablet (25 mg total) by mouth 2 (two) times daily. 60 tablet 11  . oxyCODONE (OXY IR/ROXICODONE) 5 MG immediate release tablet Take 5 mg by mouth every 4-6 hours PRN severe pain 30 tablet 0  . pantoprazole (PROTONIX) 40 MG tablet TAKE 1 TABLET BY MOUTH EVERY DAY (Patient taking differently: Take 40 mg by mouth once a day) 90 tablet 2  . simvastatin (ZOCOR) 40 MG tablet Take 40 mg by mouth at bedtime.     . Vitamin D, Cholecalciferol, 1000 units TABS Take 1,000 Units by mouth every morning.     . warfarin (COUMADIN) 5 MG tablet Take 5 mg by mouth or as directed. 90 tablet 1  . warfarin (COUMADIN) 7.5 MG tablet Take 3.75-7.5 mg by mouth See admin instructions. 7.5 mg in the evening on Sun/Tues/Thurs/Sat and 3.75 mg on Mon/Wed/Fri     No current facility-administered medications for this visit.     Allergies:   Levofloxacin and Morphine and related    Social History:  The patient  reports that she quit smoking about 33 years ago. Her smoking use included cigarettes. She has a 20.00 pack-year smoking history. she has never used smokeless tobacco. She reports that she does not drink alcohol or use drugs.   Family History:  The patient's family history includes Coronary artery disease in her brother; Hypertension in her mother and sister.    ROS:  Please see the history of present illness. All other systems are reviewed and negative.    PHYSICAL EXAM: VS:  BP 118/62   Pulse 72   Ht 5\' 3"  (1.6 m)   Wt 270 lb 6.4 oz (122.7 kg)   BMI 47.90 kg/m  , BMI Body mass index is 47.9 kg/m. GEN: Well nourished, obese, female in no acute distress  HEENT: normal for age  Neck: no JVD seen,  difficult to assess 2nd body habitus, no carotid bruit, no masses Cardiac: RRR; soft murmur, no rubs, or gallops Respiratory:  Decreased BS bases bilaterally, normal work of breathing GI: soft, nontender, nondistended, +  BS MS: no deformity or atrophy; trace edema; distal pulses are 2+ in all 4 extremities   Skin: warm and dry, no rash Neuro:  Strength and sensation are intact Psych: euthymic mood, full affect   EKG:  EKG is not ordered today.  Cardiac catheterization, 08/11/2017.   Prox Cx lesion is 50% stenosed.  Mid RCA lesion is 25% stenosed.  Ost LAD to Prox LAD lesion is 90% stenosed.  The left ventricular systolic function is normal.  LV end diastolic pressure is normal.  The left ventricular ejection fraction is 55-65% by visual estimate. 1. Single vessel obstructive CAD with severe in stent restenosis in the proximal LAD 2. Normal LV function 3. Normal LVEDPThis revealed the following: Due to these findings cardiothoracic surgical consultation was obtained and recommendations were made for coronary artery bypass grafting.A plavix washout period was recommended.  SURGERY: 08/17/2017 POSTOPERATIVE DIAGNOSIS: Severe single-vessel coronary artery disease with in-stent restenosis. PROCEDURES: Median sternotomy, extracorporeal circulation, coronary artery bypass grafting x1 with left internal mammary artery to LAD.  ECHO: 08/15/2017 - Left ventricle: The cavity size was normal. Systolic function was normal. The estimated ejection fraction was in the range of 55% to 60%. Wall motion was normal; there were no regional wall motion abnormalities. Features are consistent with a pseudonormal left ventricular filling pattern, with concomitant abnormal relaxation and increased filling pressure (grade 2 diastolic dysfunction).   Recent Labs: 08/13/2017: ALT 15; TSH 1.744 08/18/2017: Magnesium 2.5 09/23/2017: B Natriuretic Peptide 136.1; BUN 20;  Creatinine, Ser 1.20; Hemoglobin 10.3; Platelets 290; Potassium 3.5; Sodium 133    Lipid Panel    Component Value Date/Time   CHOL 133 08/10/2017 1141   TRIG 78 08/10/2017 1141   HDL 46 08/10/2017 1141   CHOLHDL 2.9 08/10/2017 1141   VLDL 16 08/10/2017 1141   LDLCALC 71 08/10/2017 1141   LDLDIRECT 143 (H) 10/10/2008 2042     Wt Readings from Last 3 Encounters:  09/30/17 270 lb 6.4 oz (122.7 kg)  09/13/17 271 lb 3.2 oz (123 kg)  08/30/17 273 lb 3.2 oz (123.9 kg)     Other studies Reviewed: Additional studies/ records that were reviewed today include: office notes, hospital records and testing.  ASSESSMENT AND PLAN:  1.  Chronic diastolic CHF: EF normal w/ grade 2 dd on 07/2017 echo. Pt believes she can largely manage her volume w/ dietary compliance and lisinopril/HCTZ. Will have her take Lasix and K+ on M-W-F, take other days as needed.   2. GERD: OK to try Pepcid or Tagamet instead of Protonix. If they work, f/u w/ PCP for rx.  3. CKD III: Pt GFR has been < 60 for over a year. Follow BMET on current Lasix dose, check at next appt.   4. CAD: No ongoing ischemic sx, continue ASA, BB, statin, ACE   5. Tachycardia: rare episodes and HR not that high. If she continues to have episodes once her volume is stabilized, consider event monitor.    Current medicines are reviewed at length with the patient today.  The patient has concerns regarding medicines. Concerns were addressed.  The following changes have been made:  See above for Lasix, Kdur and H2 blocker instructions  Labs/ tests ordered today include:   Orders Placed This Encounter  Procedures  . Basic metabolic panel     Disposition:   FU with Dr. Sallyanne [name]  Signed, Brandi Ferries, PA-C  10/02/2017 4:17 PM    [place] Phone: [phone]; Fax: [phone]  This note was written with the assistance of speech recognition software. Please excuse any transcriptional errors.

## 2017-09-30 NOTE — Patient Instructions (Signed)
Description   Continue with 1/2 tablet each Monday, Wednesday and Friday, 1 tablet all other days, repeat INR in 4 weeks.

## 2017-09-30 NOTE — Patient Instructions (Addendum)
MEDICATIONS INSTRUCTIONS  FUROSEMIDE 40 MG  AND K-DUR ( POTASSIUM ) 10 MEQ  Each  by mouth together on Monday - Wednesday- Fridays  Okay take an additional furosemide and potassium tablets if gain 3 lbs in one day or 5 lbs in one week.      MAY TRY PEPCID UP TO 400 MG A DAY  OR TAGAMET UP TO 300 MG A DAY AS THE PHARMACIST TO GENERIC EQUIVALENT    Diet instructions  salt( sodium intake) 2,000 mg daily  Water ,liquids  -- 1 to 1 and 1/2 quarts daily   Labs  OKAY TO HAVE LABS WHEN YOU SEE PULM. BMP    Your physician recommends that you schedule a follow-up appointment in [place].    If you need a refill on your cardiac medications before your next appointment, please call your pharmacy.

## 2017-10-02 ENCOUNTER — Ambulatory Visit (HOSPITAL_COMMUNITY)
Admission: EM | Admit: 2017-10-02 | Discharge: 2017-10-02 | Disposition: A | Discharge status: Home / Self Care | Payer: Medicare HMO | Attending: Internal Medicine | Admitting: Internal Medicine

## 2017-10-02 ENCOUNTER — Other Ambulatory Visit: Payer: Self-pay

## 2017-10-02 ENCOUNTER — Encounter (HOSPITAL_COMMUNITY): Payer: Self-pay | Admitting: *Deleted

## 2017-10-02 DIAGNOSIS — H60501 Unspecified acute noninfective otitis externa, right ear: Secondary | ICD-10-CM | POA: Diagnosis not present

## 2017-10-02 MED ORDER — NEOMYCIN-POLYMYXIN-HC 3.5-10000-1 OT SUSP: 3.0000 [drp] | Freq: Four times a day (QID) | OTIC | 0 refills | Status: AC

## 2017-10-02 NOTE — ED Triage Notes (Signed)
C/O right ear pain x approx 1 wk.  Denies fevers/

## 2017-10-02 NOTE — Discharge Instructions (Signed)
Mild redness and discomfort to your ear concerning for otitis externa therefore we will try ear drops, 4 times a day. If symptoms have resolved in 7 days may stop drops. If no improvement at all of symptoms or if worsening in the next 3- 5 days please visit your primary care provider for a recheck.

## 2017-10-02 NOTE — ED Provider Notes (Signed)
[place]    CSN: [id number] Arrival date & time: 10/02/17  1544     History   Chief Complaint Chief Complaint  Patient presents with  . Otalgia    HPI [name] is a 67 y.o. female.   [name] presents with complaints of right ear pain which has been present for approximately 1 week. The pain is external, pain if she touches around ear as well as shooting pain internally. Without fevers. Without runny nose, cough, previous similar illness or known ill contacts. Has not taken any medications for symptoms. Does have extensive medical history which includes htn, afib, diabetes, coronary artery disease and bypass surgery, and MI. She is on coumadin. Rates pain 5/10. Denies change or loss of hearing.    ROS per HPI.       Past Medical History:  Diagnosis Date  . Arthritis    "knees" (02/05/2016)  . Cancer ([place]) 5/10   LUL Vats   . Coronary artery disease   . GERD (gastroesophageal reflux disease)    tx meds  . H/O hiatal hernia   . History of blood transfusion "several"   last april 2015 s/p knee surgery (02/05/2016)  . Hypercholesteremia   . Hypertension   . Lung cancer ([place])    2010, surgery 18% left lung no radiation or chemo  . Non-STEMI (non-ST elevated myocardial infarction) ([place]) 04/07/2012   See cath results below  . Paroxysmal atrial fibrillation ([place]) 04/07/2012   On Warfarin  . Peripheral vascular disease ([place]) 8/12   Lt SFA PTA  . Presence of stent in LAD coronary artery 04/07/2012   Xience Expedition DES 2.75 mm x 18 mm (dilated to 3.0 mm)  . Sleep apnea    does not wear CPAP  . Type II diabetes mellitus (HCC)    insulin dependent    Patient Active Problem List   Diagnosis Date Noted  . S/P CABG x 1 08/17/2017  . Abnormal nuclear stress test 02/05/2016  . Chest pain with high risk for cardiac etiology 02/05/2016  . CAD S/P percutaneous coronary angioplasty 02/05/2016  . GERD (gastroesophageal reflux disease) 01/06/2016  . CKD  (chronic kidney disease) stage 3, GFR 30-59 ml/min (HCC) 01/06/2016  . Chronic combined systolic and diastolic heart failure ([place]) 06/28/2015  . Esophageal reflux 02/15/2014  . Anemia 01/17/2014  . S/P left TK revision 12/31/2013  . Knee osteomyelitis, left ([place]) 11/21/2013  . Foreign body of knee with infection 11/18/2013  . Unspecified constipation 11/07/2013  . Expected blood loss anemia 11/06/2013  . Left knee spacer, following removal of joint prosthesis 11/05/2013  . DM (diabetes mellitus), type 2 with peripheral vascular complications ([place]) 78/29/5621  . Onychomycosis 10/23/2013  . Pain, foot 10/23/2013  . DJD (degenerative joint disease) of knee 04/10/2013  . Vaginal atrophy 01/01/2013  . OAB (overactive bladder) 01/01/2013  . Osteopenia 01/01/2013  . Chest pain 11/28/2012  . Palpitations 11/22/2012  . Dyslipidemia 11/22/2012  . Chronic anticoagulation 11/22/2012  . Morbid obesity ([place]) 07/11/2012  . Trimalleolar fracture 07/07/2012  . CAD s/p LAD DES 2013, 2015 and 2017 04/08/2012  . ICM, EF 40-45% cath 04/07/12- improved to 50-60% by echo Oct 2013 04/08/2012  . Paroxysmal atrial fibrillation 04/07/2012  . NSTEMI (non-ST elevated myocardial infarction)- 7/13 04/07/2012  . Unstable angina ([place]) 04/07/2012  . PVD (peripheral vascular disease), Lt SFA PTA Aug 2012 04/07/2012  . Cancer of left lung parenchyma ([place]) 01/13/2009  . Insulin dependent diabetes mellitus ([place]) 11/27/2008  .  Essential hypertension 11/27/2008    Past Surgical History:  Procedure Laterality Date  . ABDOMINAL HYSTERECTOMY  1990  . BREAST EXCISIONAL BIOPSY Right   . CARDIAC CATHETERIZATION  11/04/2008   patent RCA, LM, and Circ, nl EF  . CARDIAC CATHETERIZATION  02/20/2002   patent coronaries with the only abnormality being a smooth luminla irregularity in the mid intermediate ramus branch no felt to be hemodynamically significant, nl LV  . CARDIAC CATHETERIZATION N/A 02/05/2016   Procedure: Left Heart  Cath and Coronary Angiography;  Surgeon: Leonie Man, MD;  Location: [place] [facility];  Service: Cardiovascular;  Laterality: N/A;  . CARDIAC CATHETERIZATION N/A 02/05/2016   Procedure: Coronary Stent Intervention;  Surgeon: Leonie Man, MD;  Location: [place] [facility];  Service: Cardiovascular;  Laterality: N/A;  . CATARACT EXTRACTION W/ INTRAOCULAR LENS  IMPLANT, BILATERAL  2013  . CORONARY ANGIOPLASTY WITH STENT PLACEMENT  04/07/2012   Xience Expedition DES 2.1mm x 18 mm (dilated to 3.0 mm) to the prox LAD   . CORONARY ANGIOPLASTY WITH STENT PLACEMENT  2013; 2015  . CORONARY ARTERY BYPASS GRAFT N/A 08/17/2017   Procedure: CORONARY ARTERY BYPASS GRAFTING (CABG) TIMES 1 USING LEFT INTERNAL MAMMARY ARTERY;  Surgeon: Melrose Nakayama, MD;  Location: [place];  Service: Open Heart Surgery;  Laterality: N/A;  . DILATION AND CURETTAGE OF UTERUS  <1990  . ESOPHAGOGASTRODUODENOSCOPY N/A 05/23/2015   Procedure: ESOPHAGOGASTRODUODENOSCOPY (EGD);  Surgeon: Carol Ada, MD;  Location: [facility];  Service: Endoscopy;  Laterality: N/A;  . ESOPHAGOGASTRODUODENOSCOPY (EGD) WITH PROPOFOL N/A 06/13/2015   Procedure: ESOPHAGOGASTRODUODENOSCOPY (EGD) WITH PROPOFOL;  Surgeon: Carol Ada, MD;  [facility];  Service: Endoscopy;  Laterality: N/A;  . FRACTURE SURGERY    . HAMMER TOE SURGERY Bilateral 1993   with screws, on screw removed  . JOINT REPLACEMENT    . KNEE ARTHROSCOPY Bilateral    left x2, right x1  . LAPAROSCOPIC CHOLECYSTECTOMY    . LEFT HEART CATH AND CORONARY ANGIOGRAPHY N/A 08/11/2017   Procedure: LEFT HEART CATH AND CORONARY ANGIOGRAPHY;  Surgeon: Martinique, Peter M, MD;  Location: [place] [facility];  Service: Cardiovascular;  Laterality: N/A;  . LEFT HEART CATHETERIZATION WITH CORONARY ANGIOGRAM N/A 04/07/2012   Procedure: LEFT HEART CATHETERIZATION WITH CORONARY ANGIOGRAM;  Surgeon: [name], MD;  Location: [facility];  Service: Cardiovascular;  Laterality: N/A;    . LEFT HEART CATHETERIZATION WITH CORONARY ANGIOGRAM N/A 05/27/2014   Procedure: LEFT HEART CATHETERIZATION WITH CORONARY ANGIOGRAM;  Surgeon: [name], MD; LAD 99% ISR, CFX 50-60%, RCA (dominant) no sig dz, EF 60%  . LOWER EXTREMITY ANGIOGRAM  05/17/11   directional atherectomy to the prox L SFA using a LX Man TurboHawk, ballooned with a [employer] balloon   . ORIF ANKLE FRACTURE  07/09/2012   Procedure: OPEN REDUCTION INTERNAL FIXATION (ORIF) ANKLE FRACTURE;  Surgeon: Meredith Pel, MD;  [facility];  Service: Orthopedics;  Laterality: Left;  open reduction internal fixation trimalleolar ankle fracture medial malleolous fixation  . PERCUTANEOUS CORONARY STENT INTERVENTION (PCI-S) N/A 04/07/2012   Procedure: PERCUTANEOUS CORONARY STENT INTERVENTION (PCI-S);  Surgeon: [name], MD;  Location: [facility] C[facility] LAB;  Service: Cardiovascular;  Laterality: N/A;  . PERCUTANEOUS CORONARY STENT INTERVENTION (PCI-S)  05/27/2014   Procedure: PERCUTANEOUS CORONARY STENT INTERVENTION (PCI-S);  Surgeon: [name], MD; 3 mm x 12 mm long Xience Xpedition DES to the proximal LAD  . SAVORY DILATION N/A 06/13/2015   Procedure: SAVORY  DILATION;  Surgeon: Carol Ada, MD;  [facility];  Service: Endoscopy;  Laterality: N/A;  . TEE WITHOUT CARDIOVERSION N/A 08/17/2017   Procedure: TRANSESOPHAGEAL ECHOCARDIOGRAM (TEE);  Surgeon: Melrose Nakayama, MD;  Location: [place];  Service: Open Heart Surgery;  Laterality: N/A;  . TONSILLECTOMY    . TOTAL KNEE ARTHROPLASTY Left 2003  . TOTAL KNEE REVISION Left 11/05/2013   Procedure: LEFT TOTAL KNEE RESECTION;  Surgeon: Mauri Pole, MD;  [facility];  Service: Orthopedics;  Laterality: Left;  . TOTAL KNEE REVISION Left 2007   opened in 2006 and cleaned out, 2007 revision  . TOTAL KNEE REVISION Left 12/31/2013   Procedure: RE-INPLANTATION LEFT TOTAL KNEE ;  Surgeon: Mauri Pole, MD;  [facility];  Service: Orthopedics;  Laterality:  Left;  Marland Kitchen VIDEO ASSISTED THORACOSCOPY (VATS)/ LOBECTOMY  01/2009    OB History    Gravida Para Term Preterm AB Living   1 1       1    SAB TAB Ectopic Multiple Live Births                   Home Medications    Prior to Admission medications   Medication Sig Start Date End Date Taking? Authorizing Provider  aspirin EC 81 MG tablet Take 81 mg by mouth daily.   Yes [provider]  furosemide (LASIX) 40 MG tablet Take 1 tablet (40 mg total) by mouth daily. 09/30/17  Yes Barrett, Evelene Croon, PA-C  glimepiride (AMARYL) 2 MG tablet Take 2 mg by mouth daily with breakfast.   Yes [provider]  insulin NPH-regular Human (NOVOLIN 70/30) (70-30) 100 UNIT/ML injection Inject 30-34 Units into the skin 2 (two) times daily with a meal. Prior to surgery, she took 42 units in the morning 20 units in the evening. As of 08/23/2017, she is on 20 units in am and 20 units in the pm. Her Insulin gradually needs to be increased to pre surgery doses as glucose levels allow. Patient taking differently: Inject 20 Units into the skin 2 (two) times daily before a meal.  08/23/17  Yes Tacy Dura, Donielle M, PA-C  lisinopril-hydrochlorothiazide (PRINZIDE,ZESTORETIC) 20-25 MG per tablet Take 1 tablet by mouth daily.   Yes [provider]  metoprolol tartrate (LOPRESSOR) 25 MG tablet Take 1 tablet (25 mg total) by mouth 2 (two) times daily. 05/05/17  Yes Croitoru, Mihai, MD  pantoprazole (PROTONIX) 40 MG tablet TAKE 1 TABLET BY MOUTH EVERY DAY Patient taking differently: Take 40 mg by mouth once a day 08/23/17  Yes Croitoru, Mihai, MD  potassium chloride (K-DUR) 10 MEQ tablet Take 1 tablet (10 mEq total) by mouth daily. 09/30/17 12/29/17 Yes Barrett, Evelene Croon, PA-C  simvastatin (ZOCOR) 40 MG tablet Take 40 mg by mouth at bedtime.    Yes [provider]  Vitamin D, Cholecalciferol, 1000 units TABS Take 1,000 Units by mouth every morning.    Yes [provider]  warfarin (COUMADIN) 7.5  MG tablet Take 7.5 mg by mouth daily. 7.5 mg in the evening on Sun/Tues/Thurs/Sat and 3.75 mg on Mon/Wed/Fri   Yes [provider]  neomycin-polymyxin-hydrocortisone (CORTISPORIN) 3.5-10000-1 OTIC suspension Place 3 drops into the right ear 4 (four) times daily for 10 days. 10/02/17 10/12/17  Zigmund Gottron, NP    Family History Family History  Problem Relation Age of Onset  . Hypertension Mother   . Coronary artery disease Brother   . Hypertension Sister  Social History Social History   Tobacco Use  . Smoking status: Former Smoker    Packs/day: 1.00    Years: 20.00    Pack years: 20.00    Types: Cigarettes    Last attempt to quit: 09/27/1984    Years since quitting: 33.0  . Smokeless tobacco: Never Used  Substance Use Topics  . Alcohol use: No  . Drug use: No     Allergies   Levofloxacin and Morphine and related   Review of Systems Review of Systems   Physical Exam Triage Vital Signs ED Triage Vitals  Enc Vitals Group     BP 10/02/17 1657 (!) 136/54     Pulse Rate 10/02/17 1657 80     Resp 10/02/17 1657 20     Temp 10/02/17 1657 98.4 F (36.9 C)     Temp Source 10/02/17 1657 Oral     SpO2 10/02/17 1657 100 %     Weight --      Height --      Head Circumference --      Peak Flow --      Pain Score 10/02/17 1658 5     Pain Loc --      Pain Edu? --      Excl. in [place]? --    No data found.  Updated Vital Signs BP (!) 136/54   Pulse 80   Temp 98.4 F (36.9 C) (Oral)   Resp 20   SpO2 100%   Visual Acuity Right Eye Distance:   Left Eye Distance:   Bilateral Distance:    Right Eye Near:   Left Eye Near:    Bilateral Near:     Physical Exam  Constitutional: She is oriented to person, place, and time. She appears well-developed and well-nourished. No distress.  HENT:  Head: Normocephalic and atraumatic.  Right Ear: Tympanic membrane and external ear normal. There is tenderness.  Left Ear: Tympanic membrane, external ear and ear canal  normal.  Nose: Nose normal.  Mouth/Throat: Uvula is midline, oropharynx is clear and moist and mucous membranes are normal. No tonsillar exudate.  Mild redness to right ear canal with pain with manipulation of pinna to internal ear; without pinna redness swelling or lesion  Eyes: Conjunctivae and EOM are normal. Pupils are equal, round, and reactive to light.  Cardiovascular: Normal rate, regular rhythm and normal heart sounds.  Pulmonary/Chest: Effort normal and breath sounds normal.  Neurological: She is alert and oriented to person, place, and time.  Skin: Skin is warm and dry.     UC Treatments / Results  Labs (all labs ordered are listed, but only abnormal results are displayed) Labs Reviewed - No data to display  EKG  EKG Interpretation None       Radiology No results found.  Procedures Procedures (including critical care time)  Medications Ordered in UC Medications - No data to display   Initial Impression / Assessment and Plan / UC Course  I have reviewed the triage vital signs and the nursing notes.  Pertinent labs & imaging results that were available during my care of the patient were reviewed by me and considered in my medical decision making (see chart for details).     Concern for mild otitis externa, cortisporin drops initiated at this time. Follow up with PCP in the next 5 days if symptoms do not improve or if worsen. Patient verbalized understanding and agreeable to plan.    Final Clinical Impressions(s) / UC Diagnoses  Final diagnoses:  Acute otitis externa of right ear, unspecified type    ED Discharge Orders        Ordered    neomycin-polymyxin-hydrocortisone (CORTISPORIN) 3.5-10000-1 OTIC suspension  4 times daily     10/02/17 1737       Controlled Substance Prescriptions [place] Controlled Substance Registry consulted? Not Applicable   Zigmund Gottron, NP 10/02/17 1751    Zigmund Gottron, NP 10/02/17 1751

## 2017-10-03 ENCOUNTER — Other Ambulatory Visit: Payer: Self-pay | Admitting: *Deleted

## 2017-10-03 ENCOUNTER — Encounter: Payer: Self-pay | Admitting: *Deleted

## 2017-10-03 DIAGNOSIS — I130 Hypertensive heart and chronic kidney disease with heart failure and stage 1 through stage 4 chronic kidney disease, or unspecified chronic kidney disease: Secondary | ICD-10-CM | POA: Diagnosis not present

## 2017-10-03 DIAGNOSIS — E1151 Type 2 diabetes mellitus with diabetic peripheral angiopathy without gangrene: Secondary | ICD-10-CM | POA: Diagnosis not present

## 2017-10-03 DIAGNOSIS — I2510 Atherosclerotic heart disease of native coronary artery without angina pectoris: Secondary | ICD-10-CM | POA: Diagnosis not present

## 2017-10-03 DIAGNOSIS — E1122 Type 2 diabetes mellitus with diabetic chronic kidney disease: Secondary | ICD-10-CM | POA: Diagnosis not present

## 2017-10-03 DIAGNOSIS — N183 Chronic kidney disease, stage 3 (moderate): Secondary | ICD-10-CM | POA: Diagnosis not present

## 2017-10-03 DIAGNOSIS — D631 Anemia in chronic kidney disease: Secondary | ICD-10-CM | POA: Diagnosis not present

## 2017-10-03 DIAGNOSIS — I5042 Chronic combined systolic (congestive) and diastolic (congestive) heart failure: Secondary | ICD-10-CM | POA: Diagnosis not present

## 2017-10-03 DIAGNOSIS — G4733 Obstructive sleep apnea (adult) (pediatric): Secondary | ICD-10-CM | POA: Diagnosis not present

## 2017-10-03 DIAGNOSIS — I480 Paroxysmal atrial fibrillation: Secondary | ICD-10-CM | POA: Diagnosis not present

## 2017-10-03 NOTE — Progress Notes (Signed)
Thanks MCr 

## 2017-10-03 NOTE — Patient Outreach (Addendum)
[facility]) Care Management   10/03/2017  [name] 10/01/1950 [id number]  [name] is an 67 y.o. female  Subjective:   Member alert and oriented x3, denies pain or discomfort at this time.  She does report some soreness at the incision site, no signs of infection.  Report she was seen in the urgent care yesterday for ear pain, state she was digging in it with a pen top due to itching.  Has not obtained medication prescribed for ear pain due to the cost, state she had to pay $45 copay for urgent care.    Objective:   Review of Systems  Constitutional: Negative.   HENT: Negative.   Eyes: Negative.   Respiratory: Negative.   Cardiovascular: Negative.   Gastrointestinal: Negative.   Genitourinary: Negative.   Musculoskeletal: Negative.   Skin: Negative.   Neurological: Negative.   Endo/Heme/Allergies: Negative.   Psychiatric/Behavioral: Negative.     Physical Exam  Constitutional: She is oriented to person, place, and time. She appears well-developed and well-nourished.  Neck: Normal range of motion.  Cardiovascular: Normal rate, regular rhythm and normal heart sounds.  Respiratory: Effort normal and breath sounds normal.  GI: Soft. Bowel sounds are normal.  Musculoskeletal: Normal range of motion.  Neurological: She is alert and oriented to person, place, and time.  Skin: Skin is warm and dry.   BP 122/76 (BP Location: Left Arm, Patient Position: Sitting, Cuff Size: Normal)   Pulse 64   Resp 18   Ht 1.6 m (5' 3")   Wt 268 lb 12.8 oz (121.9 kg)   SpO2 98%   BMI 47.62 kg/m   Encounter Medications:   Outpatient Encounter Medications as of 10/03/2017  Medication Sig Note  . aspirin EC 81 MG tablet Take 81 mg by mouth daily.   . furosemide (LASIX) 40 MG tablet Take 1 tablet (40 mg total) by mouth daily.   Marland Kitchen glimepiride (AMARYL) 2 MG tablet Take 2 mg by mouth daily with breakfast.   . insulin NPH-regular Human (NOVOLIN 70/30) (70-30) 100  UNIT/ML injection Inject 30-34 Units into the skin 2 (two) times daily with a meal. Prior to surgery, she took 42 units in the morning 20 units in the evening. As of 08/23/2017, she is on 20 units in am and 20 units in the pm. Her Insulin gradually needs to be increased to pre surgery doses as glucose levels allow. (Patient taking differently: Inject 20 Units into the skin 2 (two) times daily before a meal. ) 09/23/2017: Units endorsed by the patient  . lisinopril-hydrochlorothiazide (PRINZIDE,ZESTORETIC) 20-25 MG per tablet Take 1 tablet by mouth daily.   . metoprolol tartrate (LOPRESSOR) 25 MG tablet Take 1 tablet (25 mg total) by mouth 2 (two) times daily.   . pantoprazole (PROTONIX) 40 MG tablet TAKE 1 TABLET BY MOUTH EVERY DAY (Patient taking differently: Take 40 mg by mouth once a day)   . potassium chloride (K-DUR) 10 MEQ tablet Take 1 tablet (10 mEq total) by mouth daily.   . simvastatin (ZOCOR) 40 MG tablet Take 40 mg by mouth at bedtime.    . Vitamin D, Cholecalciferol, 1000 units TABS Take 1,000 Units by mouth every morning.    . warfarin (COUMADIN) 7.5 MG tablet Take 7.5 mg by mouth daily. 7.5 mg in the evening on Sun/Tues/Thurs/Sat and 3.75 mg on Mon/Wed/Fri 09/23/2017: Regimen endorsed by the patient (she even verified the color of the tablet)  . neomycin-polymyxin-hydrocortisone (CORTISPORIN) 3.5-10000-1 OTIC suspension Place  3 drops into the right ear 4 (four) times daily for 10 days. (Patient not taking: Reported on 10/03/2017)    No facility-administered encounter medications on file as of 10/03/2017.     Functional Status:   In your present state of health, do you have any difficulty performing the following activities: 10/03/2017 08/26/2017  Hearing? N N  Vision? N N  Difficulty concentrating or making decisions? N N  Walking or climbing stairs? Y Y  Dressing or bathing? N N  Doing errands, shopping? Y N  Comment Family helps -  Preparing Food and eating ? Y Y  Using the Toilet?  N N  In the past six months, have you accidently leaked urine? N Y  Do you have problems with loss of bowel control? N N  Managing your Medications? N N  Managing your Finances? N N  Housekeeping or managing your Housekeeping? Y Y  Some recent data might be hidden    Fall/Depression Screening:    Fall Risk  10/03/2017 08/26/2017 09/17/2014  Falls in the past year? No Yes No  Number falls in past yr: - 1 -  Injury with Fall? - No -  Risk for fall due to : - History of fall(s);Impaired balance/gait;Impaired mobility Impaired mobility;Impaired balance/gait  Follow up - Education provided;Falls prevention discussed -   PHQ 2/9 Scores 10/03/2017 08/26/2017 02/26/2014 01/17/2014 12/17/2013 11/21/2013  PHQ - 2 Score 0 0 0 0 0 0    Assessment:    Met with member at scheduled time.  Assessment complete, consent signed.  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  State she is still recovering well, not back to 100% yet.  She is familiar with her extensive cardiac history as she has had multiple stents in the past 5 years, had to have bypass surgery due to stent failures.    She has scale, started weighing herself yesterday.  She has pulse oximeter to monitor oxygen level and heart rate.  Will have family order blood pressure monitor.  Medication reviewed, state she is having problems swallowing potassium pills.  She has tried to allow them to dissolve in applesauce, however tablets are coated and does not dissolve completely.  She will try again, if unable to swallow will request liquid.  Next appointment with surgeon on 1/22, primary MD next week.  Denies any concerns at this time, provided with this care manager's contact information.  Advised to contact with questions.  Plan:   Will follow up telephonically next week.  THN CM Care Plan Problem One     Most Recent Value  Care Plan Problem One  Risk of readmission related to complications of CABG as evidenced by recent surgery  requiring short state at rehab facility  Role Documenting the Problem One  Care Management [place] for Problem One  Active  [facility] Long Term Goal   Member will not be readmitted to acute care facility within 31 days of discharge  [facility] Long Term Goal Start Date  09/15/17  Interventions for Problem One Long Term Goal  Member educated on signs/symptoms of tachycardia/atrial fibrillation.  Reviewed a-fib action plan with member.  THN CM Short Term Goal #1   Member will report compliance with heart healthy diet over the next 4 weeks  THN CM Short Term Goal #1 Start Date  09/15/17  Interventions for Short Term Goal #1  Provided member with handout of foods high in sodium and appropriate foods for  low sodium diet.    THN CM Short Term Goal #2   Memer will keep and attend appointment with cardiologist within the next 2 weeks  THN CM Short Term Goal #2 Start Date  09/15/17  [facility] CM Short Term Goal #2 Met Date  10/04/17  Interventions for Short Term Goal #2  Will remind pt on her upcoing CAD appointment on 1/4 and again verirfy transportation resource,  [facility] CM Short Term Goal #3  Member will weigh and record readings daily over the next 4 weeks  THN CM Short Term Goal #3 Start Date  10/03/17  Interventions for Short Tern Goal #3  Provided member with [facility] calendar tool book including weight logs.  Verified member has working scale.     [name], [place], MSN [place] [phone]

## 2017-10-04 ENCOUNTER — Telehealth: Payer: Self-pay | Admitting: Physician Assistant

## 2017-10-04 MED ORDER — POTASSIUM CHLORIDE ER 10 MEQ PO CPCR: 10.0000 meq | ORAL_CAPSULE | Freq: Every day | ORAL | 6 refills | Status: DC

## 2017-10-04 NOTE — Telephone Encounter (Signed)
UNABLE TO SWALLOW TABLET -  WE WILL E-SENT  IN CAPSULE  FORM IN STEAD 30 DAY SUPPLY  X 6 REFILLS PER PATIENT.

## 2017-10-04 NOTE — Telephone Encounter (Signed)
Attempt to return call, continues to ring without answer or going to VM

## 2017-10-04 NOTE — Telephone Encounter (Signed)
Pt c/o medication issue:  1. Name of Medication: potassium chloride (K-DUR) 10 MEQ tablet  2. How are you currently taking this medication (dosage and times per day)? Take 1 tablet (10 mEq total) by mouth daily.  3. Are you having a reaction (difficulty breathing--STAT)? no  4. What is your medication issue? Pt verbalized that she can not swallow the pill and want something else

## 2017-10-05 ENCOUNTER — Ambulatory Visit: Payer: Medicare HMO | Admitting: Podiatry

## 2017-10-05 DIAGNOSIS — I2510 Atherosclerotic heart disease of native coronary artery without angina pectoris: Secondary | ICD-10-CM | POA: Diagnosis not present

## 2017-10-05 DIAGNOSIS — I5042 Chronic combined systolic (congestive) and diastolic (congestive) heart failure: Secondary | ICD-10-CM | POA: Diagnosis not present

## 2017-10-05 DIAGNOSIS — E1122 Type 2 diabetes mellitus with diabetic chronic kidney disease: Secondary | ICD-10-CM | POA: Diagnosis not present

## 2017-10-05 DIAGNOSIS — I130 Hypertensive heart and chronic kidney disease with heart failure and stage 1 through stage 4 chronic kidney disease, or unspecified chronic kidney disease: Secondary | ICD-10-CM | POA: Diagnosis not present

## 2017-10-05 DIAGNOSIS — I480 Paroxysmal atrial fibrillation: Secondary | ICD-10-CM | POA: Diagnosis not present

## 2017-10-05 DIAGNOSIS — R2689 Other abnormalities of gait and mobility: Secondary | ICD-10-CM | POA: Diagnosis not present

## 2017-10-05 DIAGNOSIS — E1151 Type 2 diabetes mellitus with diabetic peripheral angiopathy without gangrene: Secondary | ICD-10-CM | POA: Diagnosis not present

## 2017-10-05 DIAGNOSIS — N183 Chronic kidney disease, stage 3 (moderate): Secondary | ICD-10-CM | POA: Diagnosis not present

## 2017-10-05 DIAGNOSIS — G4733 Obstructive sleep apnea (adult) (pediatric): Secondary | ICD-10-CM | POA: Diagnosis not present

## 2017-10-05 DIAGNOSIS — D631 Anemia in chronic kidney disease: Secondary | ICD-10-CM | POA: Diagnosis not present

## 2017-10-06 DIAGNOSIS — I2510 Atherosclerotic heart disease of native coronary artery without angina pectoris: Secondary | ICD-10-CM | POA: Diagnosis not present

## 2017-10-06 DIAGNOSIS — E1151 Type 2 diabetes mellitus with diabetic peripheral angiopathy without gangrene: Secondary | ICD-10-CM | POA: Diagnosis not present

## 2017-10-06 DIAGNOSIS — I5042 Chronic combined systolic (congestive) and diastolic (congestive) heart failure: Secondary | ICD-10-CM | POA: Diagnosis not present

## 2017-10-06 DIAGNOSIS — N183 Chronic kidney disease, stage 3 (moderate): Secondary | ICD-10-CM | POA: Diagnosis not present

## 2017-10-06 DIAGNOSIS — D631 Anemia in chronic kidney disease: Secondary | ICD-10-CM | POA: Diagnosis not present

## 2017-10-06 DIAGNOSIS — I130 Hypertensive heart and chronic kidney disease with heart failure and stage 1 through stage 4 chronic kidney disease, or unspecified chronic kidney disease: Secondary | ICD-10-CM | POA: Diagnosis not present

## 2017-10-06 DIAGNOSIS — G4733 Obstructive sleep apnea (adult) (pediatric): Secondary | ICD-10-CM | POA: Diagnosis not present

## 2017-10-06 DIAGNOSIS — E1122 Type 2 diabetes mellitus with diabetic chronic kidney disease: Secondary | ICD-10-CM | POA: Diagnosis not present

## 2017-10-06 DIAGNOSIS — I480 Paroxysmal atrial fibrillation: Secondary | ICD-10-CM | POA: Diagnosis not present

## 2017-10-07 DIAGNOSIS — N183 Chronic kidney disease, stage 3 (moderate): Secondary | ICD-10-CM | POA: Diagnosis not present

## 2017-10-07 DIAGNOSIS — I5042 Chronic combined systolic (congestive) and diastolic (congestive) heart failure: Secondary | ICD-10-CM | POA: Diagnosis not present

## 2017-10-07 DIAGNOSIS — E1122 Type 2 diabetes mellitus with diabetic chronic kidney disease: Secondary | ICD-10-CM | POA: Diagnosis not present

## 2017-10-07 DIAGNOSIS — I130 Hypertensive heart and chronic kidney disease with heart failure and stage 1 through stage 4 chronic kidney disease, or unspecified chronic kidney disease: Secondary | ICD-10-CM | POA: Diagnosis not present

## 2017-10-07 DIAGNOSIS — D631 Anemia in chronic kidney disease: Secondary | ICD-10-CM | POA: Diagnosis not present

## 2017-10-07 DIAGNOSIS — G4733 Obstructive sleep apnea (adult) (pediatric): Secondary | ICD-10-CM | POA: Diagnosis not present

## 2017-10-07 DIAGNOSIS — I480 Paroxysmal atrial fibrillation: Secondary | ICD-10-CM | POA: Diagnosis not present

## 2017-10-07 DIAGNOSIS — E1151 Type 2 diabetes mellitus with diabetic peripheral angiopathy without gangrene: Secondary | ICD-10-CM | POA: Diagnosis not present

## 2017-10-07 DIAGNOSIS — I2510 Atherosclerotic heart disease of native coronary artery without angina pectoris: Secondary | ICD-10-CM | POA: Diagnosis not present

## 2017-10-11 ENCOUNTER — Other Ambulatory Visit: Payer: Self-pay | Admitting: *Deleted

## 2017-10-11 DIAGNOSIS — E1122 Type 2 diabetes mellitus with diabetic chronic kidney disease: Secondary | ICD-10-CM | POA: Diagnosis not present

## 2017-10-11 DIAGNOSIS — I5042 Chronic combined systolic (congestive) and diastolic (congestive) heart failure: Secondary | ICD-10-CM | POA: Diagnosis not present

## 2017-10-11 DIAGNOSIS — I130 Hypertensive heart and chronic kidney disease with heart failure and stage 1 through stage 4 chronic kidney disease, or unspecified chronic kidney disease: Secondary | ICD-10-CM | POA: Diagnosis not present

## 2017-10-11 DIAGNOSIS — D631 Anemia in chronic kidney disease: Secondary | ICD-10-CM | POA: Diagnosis not present

## 2017-10-11 DIAGNOSIS — I480 Paroxysmal atrial fibrillation: Secondary | ICD-10-CM | POA: Diagnosis not present

## 2017-10-11 DIAGNOSIS — G4733 Obstructive sleep apnea (adult) (pediatric): Secondary | ICD-10-CM | POA: Diagnosis not present

## 2017-10-11 DIAGNOSIS — I2510 Atherosclerotic heart disease of native coronary artery without angina pectoris: Secondary | ICD-10-CM | POA: Diagnosis not present

## 2017-10-11 DIAGNOSIS — N183 Chronic kidney disease, stage 3 (moderate): Secondary | ICD-10-CM | POA: Diagnosis not present

## 2017-10-11 DIAGNOSIS — E1151 Type 2 diabetes mellitus with diabetic peripheral angiopathy without gangrene: Secondary | ICD-10-CM | POA: Diagnosis not present

## 2017-10-11 NOTE — Patient Outreach (Signed)
[place] [facility]) Care Management  10/11/2017  [name] Jan 19, 1951 [id number]   Weekly transition of care call placed to member.  She denies any chest pain or discomfort.  Report she has been "doing good."  State she now has potassium capsules where she can open and mix with applesauce rather than swallowing tablet.  Remains compliant with medications and weights, denies concerns or shortness of breath.  Denies any urgent concerns at this time, will follow up next week.   THN CM Care Plan Problem One     Most Recent Value  Care Plan Problem One  Risk of readmission related to complications of CABG as evidenced by recent surgery requiring short state at rehab facility  Role Documenting the Problem One  Care Management [place] for Problem One  Active  [facility] Long Term Goal   Member will not be readmitted to acute care facility within 31 days of discharge  [facility] Long Term Goal Start Date  09/15/17  Interventions for Problem One Long Term Goal  Verified member has follow up appointment with surgeon next week. Confirmed she has transportation.  Advised of importance attending appointment.  THN CM Short Term Goal #1   Member will report compliance with heart healthy diet over the next 4 weeks  THN CM Short Term Goal #1 Start Date  09/15/17  [facility] CM Short Term Goal #1 Met Date  10/11/17  Interventions for Short Term Goal #1  Provided member with handout of foods high in sodium and appropriate foods for low sodium diet.    THN CM Short Term Goal #2   Memer will keep and attend appointment with cardiologist within the next 2 weeks  THN CM Short Term Goal #2 Start Date  09/15/17  [facility] CM Short Term Goal #2 Met Date  10/04/17  Interventions for Short Term Goal #2  Will remind pt on her upcoing CAD appointment on 1/4 and again verirfy transportation resource,  [facility] CM Short Term Goal #3  Member will weigh and record readings daily over the next 4 weeks  THN CM Short Term  Goal #3 Start Date  10/03/17  Interventions for Short Tern Goal #3  Provided member with [facility] calendar tool book including weight logs.  Verified member has working scale.     [name], [place], MSN [place] [phone]

## 2017-10-12 DIAGNOSIS — I2510 Atherosclerotic heart disease of native coronary artery without angina pectoris: Secondary | ICD-10-CM | POA: Diagnosis not present

## 2017-10-12 DIAGNOSIS — I10 Essential (primary) hypertension: Secondary | ICD-10-CM | POA: Diagnosis not present

## 2017-10-12 DIAGNOSIS — E1059 Type 1 diabetes mellitus with other circulatory complications: Secondary | ICD-10-CM | POA: Diagnosis not present

## 2017-10-12 DIAGNOSIS — G4733 Obstructive sleep apnea (adult) (pediatric): Secondary | ICD-10-CM | POA: Diagnosis not present

## 2017-10-12 DIAGNOSIS — I130 Hypertensive heart and chronic kidney disease with heart failure and stage 1 through stage 4 chronic kidney disease, or unspecified chronic kidney disease: Secondary | ICD-10-CM | POA: Diagnosis not present

## 2017-10-12 DIAGNOSIS — N183 Chronic kidney disease, stage 3 (moderate): Secondary | ICD-10-CM | POA: Diagnosis not present

## 2017-10-12 DIAGNOSIS — I5042 Chronic combined systolic (congestive) and diastolic (congestive) heart failure: Secondary | ICD-10-CM | POA: Diagnosis not present

## 2017-10-12 DIAGNOSIS — I480 Paroxysmal atrial fibrillation: Secondary | ICD-10-CM | POA: Diagnosis not present

## 2017-10-12 DIAGNOSIS — E1122 Type 2 diabetes mellitus with diabetic chronic kidney disease: Secondary | ICD-10-CM | POA: Diagnosis not present

## 2017-10-12 DIAGNOSIS — D631 Anemia in chronic kidney disease: Secondary | ICD-10-CM | POA: Diagnosis not present

## 2017-10-12 DIAGNOSIS — E1151 Type 2 diabetes mellitus with diabetic peripheral angiopathy without gangrene: Secondary | ICD-10-CM | POA: Diagnosis not present

## 2017-10-12 DIAGNOSIS — E1065 Type 1 diabetes mellitus with hyperglycemia: Secondary | ICD-10-CM | POA: Diagnosis not present

## 2017-10-13 ENCOUNTER — Telehealth: Payer: Self-pay | Admitting: Cardiovascular Disease

## 2017-10-13 DIAGNOSIS — I480 Paroxysmal atrial fibrillation: Secondary | ICD-10-CM | POA: Diagnosis not present

## 2017-10-13 DIAGNOSIS — E1122 Type 2 diabetes mellitus with diabetic chronic kidney disease: Secondary | ICD-10-CM | POA: Diagnosis not present

## 2017-10-13 DIAGNOSIS — E1151 Type 2 diabetes mellitus with diabetic peripheral angiopathy without gangrene: Secondary | ICD-10-CM | POA: Diagnosis not present

## 2017-10-13 DIAGNOSIS — I130 Hypertensive heart and chronic kidney disease with heart failure and stage 1 through stage 4 chronic kidney disease, or unspecified chronic kidney disease: Secondary | ICD-10-CM | POA: Diagnosis not present

## 2017-10-13 DIAGNOSIS — I2510 Atherosclerotic heart disease of native coronary artery without angina pectoris: Secondary | ICD-10-CM | POA: Diagnosis not present

## 2017-10-13 DIAGNOSIS — I5042 Chronic combined systolic (congestive) and diastolic (congestive) heart failure: Secondary | ICD-10-CM | POA: Diagnosis not present

## 2017-10-13 DIAGNOSIS — G4733 Obstructive sleep apnea (adult) (pediatric): Secondary | ICD-10-CM | POA: Diagnosis not present

## 2017-10-13 DIAGNOSIS — N183 Chronic kidney disease, stage 3 (moderate): Secondary | ICD-10-CM | POA: Diagnosis not present

## 2017-10-13 DIAGNOSIS — D631 Anemia in chronic kidney disease: Secondary | ICD-10-CM | POA: Diagnosis not present

## 2017-10-13 NOTE — Telephone Encounter (Signed)
Spoke with Tillie Rung PT and patients heart rate went up to 109-134 and irregular when you go up moving around. After resting down to 50's. Patient asymptomatic and was unaware HR elevated. Will forward to Joshua who saw patient 09/30/17 for review

## 2017-10-13 NOTE — Telephone Encounter (Signed)
°  New Prob  Patient c/o Palpitations:  High priority if patient c/o lightheadedness, shortness of breath, or chest pain  1) How long have you had palpitations/irregular HR/ Afib? Last 10 minutes  2) Are you having the symptoms now? Yes  3) Are you currently experiencing lightheadedness, SOB or CP? No  4) Do you have a history of afib (atrial fibrillation) or irregular heart rhythm? Has a history of A-Fib  5) Have you checked your BP or HR? (document readings if available):   BP: 158/82 HR: 109-137 and irregular taken at 1:20PM  6) Are you experiencing any other symptoms? No

## 2017-10-17 ENCOUNTER — Other Ambulatory Visit: Payer: Self-pay | Admitting: Thoracic Surgery (Cardiothoracic Vascular Surgery)

## 2017-10-17 DIAGNOSIS — E1151 Type 2 diabetes mellitus with diabetic peripheral angiopathy without gangrene: Secondary | ICD-10-CM | POA: Diagnosis not present

## 2017-10-17 DIAGNOSIS — E1122 Type 2 diabetes mellitus with diabetic chronic kidney disease: Secondary | ICD-10-CM | POA: Diagnosis not present

## 2017-10-17 DIAGNOSIS — N183 Chronic kidney disease, stage 3 (moderate): Secondary | ICD-10-CM | POA: Diagnosis not present

## 2017-10-17 DIAGNOSIS — I130 Hypertensive heart and chronic kidney disease with heart failure and stage 1 through stage 4 chronic kidney disease, or unspecified chronic kidney disease: Secondary | ICD-10-CM | POA: Diagnosis not present

## 2017-10-17 DIAGNOSIS — Z951 Presence of aortocoronary bypass graft: Secondary | ICD-10-CM

## 2017-10-17 DIAGNOSIS — I2510 Atherosclerotic heart disease of native coronary artery without angina pectoris: Secondary | ICD-10-CM | POA: Diagnosis not present

## 2017-10-17 DIAGNOSIS — I5042 Chronic combined systolic (congestive) and diastolic (congestive) heart failure: Secondary | ICD-10-CM | POA: Diagnosis not present

## 2017-10-17 DIAGNOSIS — G4733 Obstructive sleep apnea (adult) (pediatric): Secondary | ICD-10-CM | POA: Diagnosis not present

## 2017-10-17 DIAGNOSIS — I480 Paroxysmal atrial fibrillation: Secondary | ICD-10-CM | POA: Diagnosis not present

## 2017-10-17 DIAGNOSIS — D631 Anemia in chronic kidney disease: Secondary | ICD-10-CM | POA: Diagnosis not present

## 2017-10-17 NOTE — Telephone Encounter (Signed)
Please call her and see if she is continuing to have frequent episodes of tachycardia. If so, she will need a 2 week event monitor to see her afib burden or if there is anything else going on.  Thanks

## 2017-10-18 ENCOUNTER — Ambulatory Visit
Admission: RE | Admit: 2017-10-18 | Discharge: 2017-10-18 | Disposition: A | Discharge status: Home / Self Care | Payer: Medicare HMO | Source: Ambulatory Visit | Attending: Thoracic Surgery (Cardiothoracic Vascular Surgery) | Admitting: Thoracic Surgery (Cardiothoracic Vascular Surgery)

## 2017-10-18 ENCOUNTER — Ambulatory Visit (INDEPENDENT_AMBULATORY_CARE_PROVIDER_SITE_OTHER): Payer: Self-pay | Admitting: Thoracic Surgery (Cardiothoracic Vascular Surgery)

## 2017-10-18 VITALS — BP 121/66 | HR 85 | Resp 20 | Ht 63.0 in | Wt 267.0 lb

## 2017-10-18 DIAGNOSIS — Z951 Presence of aortocoronary bypass graft: Secondary | ICD-10-CM

## 2017-10-18 DIAGNOSIS — R0602 Shortness of breath: Secondary | ICD-10-CM | POA: Diagnosis not present

## 2017-10-18 MED ORDER — METOPROLOL TARTRATE 25 MG PO TABS: 25.0000 mg | ORAL_TABLET | Freq: Three times a day (TID) | ORAL | 11 refills | Status: DC

## 2017-10-18 NOTE — Patient Instructions (Signed)
Increase lopressor to 25 mg 3 times daily

## 2017-10-18 NOTE — Progress Notes (Signed)
[address]       [place] [address]             [phone]       HPI: [name] returns for a scheduled follow-up visit  [name] is a 68 year old woman with a past history of morbid obesity, lung cancer, paroxysmal atrial fibrillation, coronary disease, non-ST elevation MI, peripheral arterial disease, hypertension, hyperlipidemia, arthritis and type 2 diabetes.  She underwent coronary bypass grafting x1 with a left internal mammary artery to LAD on 08/17/2017.  I last saw her in the office on December 18.  At that time she was doing reasonably well but still had very limited mobility mainly due to arthritis in her knees.  She was seen in the emergency room on 12/28 with tachycardia.  By the time she got there she was in sinus rhythm.  She has had a couple of other episodes of palpitations and says on one occasion she was told to take an extra dose of Lopressor.  It resolved after that.  She has some pain in the sternal area but is not taking any narcotics for that.  Past Medical History:  Diagnosis Date  . Arthritis    "knees" (02/05/2016)  . Cancer ([place]) 5/10   LUL Vats   . Coronary artery disease   . GERD (gastroesophageal reflux disease)    tx meds  . H/O hiatal hernia   . History of blood transfusion "several"   last april 2015 s/p knee surgery (02/05/2016)  . Hypercholesteremia   . Hypertension   . Lung cancer ([place])    2010, surgery 18% left lung no radiation or chemo  . Non-STEMI (non-ST elevated myocardial infarction) ([place]) 04/07/2012   See cath results below  . Paroxysmal atrial fibrillation ([place]) 04/07/2012   On Warfarin  . Peripheral vascular disease ([place]) 8/12   Lt SFA PTA  . Presence of stent in LAD coronary artery 04/07/2012   Xience Expedition DES 2.75 mm x 18 mm (dilated to 3.0 mm)  . Sleep apnea    does not wear CPAP  . Type II diabetes mellitus (HCC)    insulin dependent    Current Outpatient Medications  Medication  Sig Dispense Refill  . aspirin EC 81 MG tablet Take 81 mg by mouth daily.    . furosemide (LASIX) 40 MG tablet Take 1 tablet (40 mg total) by mouth daily. 90 tablet 3  . glimepiride (AMARYL) 2 MG tablet Take 2 mg by mouth daily with breakfast.    . insulin NPH-regular Human (NOVOLIN 70/30) (70-30) 100 UNIT/ML injection Inject 30-34 Units into the skin 2 (two) times daily with a meal. Prior to surgery, she took 42 units in the morning 20 units in the evening. As of 08/23/2017, she is on 20 units in am and 20 units in the pm. Her Insulin gradually needs to be increased to pre surgery doses as glucose levels allow. (Patient taking differently: Inject 20 Units into the skin 2 (two) times daily before a meal. ) 10 mL 11  . lisinopril-hydrochlorothiazide (PRINZIDE,ZESTORETIC) 20-25 MG per tablet Take 1 tablet by mouth daily.    . metoprolol tartrate (LOPRESSOR) 25 MG tablet Take 1 tablet (25 mg total) by mouth 3 (three) times daily. 60 tablet 11  . pantoprazole (PROTONIX) 40 MG tablet TAKE 1 TABLET BY MOUTH EVERY DAY (Patient taking differently: Take 40 mg by mouth once a day) 90 tablet 2  . potassium chloride (MICRO-K)  10 MEQ CR capsule Take 1 capsule (10 mEq total) by mouth daily. 30 capsule 6  . simvastatin (ZOCOR) 40 MG tablet Take 40 mg by mouth at bedtime.     . Vitamin D, Cholecalciferol, 1000 units TABS Take 1,000 Units by mouth every morning.     . warfarin (COUMADIN) 7.5 MG tablet Take 7.5 mg by mouth daily. 7.5 mg in the evening on Sun/Tues/Thurs/Sat and 3.75 mg on Mon/Wed/Fri     No current facility-administered medications for this visit.     Physical Exam BP 121/66   Pulse 85   Resp 20   Ht 5\' 3"  (1.6 m)   Wt 267 lb (121.1 kg)   SpO2 98% Comment: RA  BMI 47.30 kg/m  Morbidly obese 67 year old woman in no acute distress Alert and oriented x3 with no focal deficits Lungs diminished breath sounds both bases Cardiac regular rate and rhythm Sternal incision well-healed, sternum  stable  Diagnostic Tests: CHEST  2 VIEW  COMPARISON:  Portable chest x-ray of 09/23/2017  FINDINGS: The lungs are poorly aerated and there is cardiomegaly present with probable pulmonary vascular congestion. No definite pleural effusion is seen. Median sternotomy sutures are present and there appears to be a coronary artery stent noted. No bony abnormality is seen.  IMPRESSION: Poor inspiration with cardiomegaly and probable mild pulmonary vascular congestion.   Electronically Signed   By: Ivar Drape M.D.   On: 10/18/2017 11:46 I personally reviewed the chest x-ray images and concur with the findings noted above  Impression: [name] is a 68 year old woman with multiple medical problems who underwent coronary bypass grafting x1 with a mammary to LAD on 08/17/2017.  She is now about 2 months out from surgery.  She seems to be recovering well from the surgery.  Her activity is still extremely limited due to arthritis in her knees.  She has been having some palpitations recently.  She is on 25 of Lopressor twice daily.  She does have a history of paroxysmal atrial fibrillation.  I recommended she increase the Lopressor to 3 times daily until she sees Dr. Sallyanne Kuster on February 8.  There are no restrictions on her activities from a surgical standpoint  Plan: Follow up with Dr. Recardo Evangelist  I will be happy to see her back anytime in the future if I can be of any further assistance with her care  Melrose Nakayama, MD [facility] [phone]

## 2017-10-18 NOTE — Telephone Encounter (Signed)
Spoke with [name] she had an appt with Dr Roxan Hockey today and she stated he has increase her Metoprolol 75 mg(3 tablets) daily until f/u appt with Kerin Ransom on February 6th, advised pt to monitor blood pressure and heart rate and keep appt with Lurena Joiner

## 2017-10-19 ENCOUNTER — Other Ambulatory Visit: Payer: Self-pay | Admitting: *Deleted

## 2017-10-19 DIAGNOSIS — D631 Anemia in chronic kidney disease: Secondary | ICD-10-CM | POA: Diagnosis not present

## 2017-10-19 DIAGNOSIS — I2510 Atherosclerotic heart disease of native coronary artery without angina pectoris: Secondary | ICD-10-CM | POA: Diagnosis not present

## 2017-10-19 DIAGNOSIS — E1065 Type 1 diabetes mellitus with hyperglycemia: Secondary | ICD-10-CM | POA: Diagnosis not present

## 2017-10-19 DIAGNOSIS — G4733 Obstructive sleep apnea (adult) (pediatric): Secondary | ICD-10-CM | POA: Diagnosis not present

## 2017-10-19 DIAGNOSIS — I480 Paroxysmal atrial fibrillation: Secondary | ICD-10-CM | POA: Diagnosis not present

## 2017-10-19 DIAGNOSIS — E1151 Type 2 diabetes mellitus with diabetic peripheral angiopathy without gangrene: Secondary | ICD-10-CM | POA: Diagnosis not present

## 2017-10-19 DIAGNOSIS — I10 Essential (primary) hypertension: Secondary | ICD-10-CM | POA: Diagnosis not present

## 2017-10-19 DIAGNOSIS — I5042 Chronic combined systolic (congestive) and diastolic (congestive) heart failure: Secondary | ICD-10-CM | POA: Diagnosis not present

## 2017-10-19 DIAGNOSIS — R918 Other nonspecific abnormal finding of lung field: Secondary | ICD-10-CM | POA: Diagnosis not present

## 2017-10-19 DIAGNOSIS — E782 Mixed hyperlipidemia: Secondary | ICD-10-CM | POA: Diagnosis not present

## 2017-10-19 DIAGNOSIS — E1122 Type 2 diabetes mellitus with diabetic chronic kidney disease: Secondary | ICD-10-CM | POA: Diagnosis not present

## 2017-10-19 DIAGNOSIS — I130 Hypertensive heart and chronic kidney disease with heart failure and stage 1 through stage 4 chronic kidney disease, or unspecified chronic kidney disease: Secondary | ICD-10-CM | POA: Diagnosis not present

## 2017-10-19 DIAGNOSIS — N183 Chronic kidney disease, stage 3 (moderate): Secondary | ICD-10-CM | POA: Diagnosis not present

## 2017-10-19 DIAGNOSIS — I482 Chronic atrial fibrillation: Secondary | ICD-10-CM | POA: Diagnosis not present

## 2017-10-19 NOTE — Patient Outreach (Signed)
[facility]) Care Management  10/19/2017  BRIDEY BROOKOVER Mar 24, 1951 [id number]   Weekly transition of care call placed to member, no answer.  Unable to leave a message as phone continues to ring.  Will await call back, if no call back will follow up next week.  [name], [place], MSN [place] [phone]

## 2017-10-21 ENCOUNTER — Institutional Professional Consult (permissible substitution): Payer: Medicare HMO | Admitting: Emergency Medicine

## 2017-10-24 ENCOUNTER — Telehealth (HOSPITAL_COMMUNITY): Payer: Self-pay

## 2017-10-24 NOTE — Telephone Encounter (Signed)
Called to speak with patient in regards to Cardiac Rehab - Scheduled orientation on 12/01/2017 at 8:45am. Patient will attend the 11:15am exc class.

## 2017-10-26 DIAGNOSIS — I5042 Chronic combined systolic (congestive) and diastolic (congestive) heart failure: Secondary | ICD-10-CM | POA: Diagnosis not present

## 2017-10-26 DIAGNOSIS — E1151 Type 2 diabetes mellitus with diabetic peripheral angiopathy without gangrene: Secondary | ICD-10-CM | POA: Diagnosis not present

## 2017-10-26 DIAGNOSIS — N183 Chronic kidney disease, stage 3 (moderate): Secondary | ICD-10-CM | POA: Diagnosis not present

## 2017-10-26 DIAGNOSIS — G4733 Obstructive sleep apnea (adult) (pediatric): Secondary | ICD-10-CM | POA: Diagnosis not present

## 2017-10-26 DIAGNOSIS — I130 Hypertensive heart and chronic kidney disease with heart failure and stage 1 through stage 4 chronic kidney disease, or unspecified chronic kidney disease: Secondary | ICD-10-CM | POA: Diagnosis not present

## 2017-10-26 DIAGNOSIS — I2510 Atherosclerotic heart disease of native coronary artery without angina pectoris: Secondary | ICD-10-CM | POA: Diagnosis not present

## 2017-10-26 DIAGNOSIS — D631 Anemia in chronic kidney disease: Secondary | ICD-10-CM | POA: Diagnosis not present

## 2017-10-26 DIAGNOSIS — E1122 Type 2 diabetes mellitus with diabetic chronic kidney disease: Secondary | ICD-10-CM | POA: Diagnosis not present

## 2017-10-26 DIAGNOSIS — I480 Paroxysmal atrial fibrillation: Secondary | ICD-10-CM | POA: Diagnosis not present

## 2017-10-28 ENCOUNTER — Other Ambulatory Visit: Payer: Self-pay | Admitting: *Deleted

## 2017-10-28 DIAGNOSIS — I480 Paroxysmal atrial fibrillation: Secondary | ICD-10-CM | POA: Diagnosis not present

## 2017-10-28 DIAGNOSIS — I130 Hypertensive heart and chronic kidney disease with heart failure and stage 1 through stage 4 chronic kidney disease, or unspecified chronic kidney disease: Secondary | ICD-10-CM | POA: Diagnosis not present

## 2017-10-28 DIAGNOSIS — E1122 Type 2 diabetes mellitus with diabetic chronic kidney disease: Secondary | ICD-10-CM | POA: Diagnosis not present

## 2017-10-28 DIAGNOSIS — E1151 Type 2 diabetes mellitus with diabetic peripheral angiopathy without gangrene: Secondary | ICD-10-CM | POA: Diagnosis not present

## 2017-10-28 DIAGNOSIS — N183 Chronic kidney disease, stage 3 (moderate): Secondary | ICD-10-CM | POA: Diagnosis not present

## 2017-10-28 DIAGNOSIS — I2510 Atherosclerotic heart disease of native coronary artery without angina pectoris: Secondary | ICD-10-CM | POA: Diagnosis not present

## 2017-10-28 DIAGNOSIS — I5042 Chronic combined systolic (congestive) and diastolic (congestive) heart failure: Secondary | ICD-10-CM | POA: Diagnosis not present

## 2017-10-28 DIAGNOSIS — G4733 Obstructive sleep apnea (adult) (pediatric): Secondary | ICD-10-CM | POA: Diagnosis not present

## 2017-10-28 DIAGNOSIS — D631 Anemia in chronic kidney disease: Secondary | ICD-10-CM | POA: Diagnosis not present

## 2017-10-28 NOTE — Patient Outreach (Signed)
[facility]) Care Management  10/28/2017  IVIANA BLASINGAME [birth year] [id number]   Call placed to member to follow up on current health status and to complete transition of care program, no answer.  Unable to leave a message, phone continues to ring.  Will follow up next week.  [name], [place], MSN [place] [phone]

## 2017-10-31 ENCOUNTER — Ambulatory Visit: Payer: Medicare HMO | Admitting: Cardiology

## 2017-10-31 ENCOUNTER — Ambulatory Visit (INDEPENDENT_AMBULATORY_CARE_PROVIDER_SITE_OTHER): Payer: Medicare HMO | Admitting: Pharmacist Clinician (PhC)/ Clinical Pharmacy Specialist

## 2017-10-31 ENCOUNTER — Encounter: Payer: Self-pay | Admitting: Cardiology

## 2017-10-31 VITALS — BP 142/70 | HR 74 | Ht 63.0 in | Wt 272.0 lb

## 2017-10-31 DIAGNOSIS — Z951 Presence of aortocoronary bypass graft: Secondary | ICD-10-CM | POA: Diagnosis not present

## 2017-10-31 DIAGNOSIS — Z7901 Long term (current) use of anticoagulants: Secondary | ICD-10-CM

## 2017-10-31 DIAGNOSIS — I480 Paroxysmal atrial fibrillation: Secondary | ICD-10-CM

## 2017-10-31 DIAGNOSIS — E119 Type 2 diabetes mellitus without complications: Secondary | ICD-10-CM | POA: Diagnosis not present

## 2017-10-31 DIAGNOSIS — IMO0001 Reserved for inherently not codable concepts without codable children: Secondary | ICD-10-CM

## 2017-10-31 DIAGNOSIS — Z794 Long term (current) use of insulin: Secondary | ICD-10-CM

## 2017-10-31 DIAGNOSIS — I4510 Unspecified right bundle-branch block: Secondary | ICD-10-CM

## 2017-10-31 LAB — POCT INR: INR: 2.6

## 2017-10-31 NOTE — Assessment & Plan Note (Signed)
BMI 48. I discussed considering a sleep study but she tells me she had one 10 yrs ago and was told she only had "mild" sleep apnea.

## 2017-10-31 NOTE — Assessment & Plan Note (Signed)
CABG x 1 08/17/17 secondary to LAD ISR

## 2017-10-31 NOTE — Progress Notes (Signed)
Thanks MCr 

## 2017-10-31 NOTE — Assessment & Plan Note (Signed)
Some recurrent episodes by her history since CABG, NSR today.

## 2017-10-31 NOTE — Progress Notes (Signed)
10/31/2017 [name]   1951/01/18  [id number]  Primary Physician [name], MD Primary Cardiologist: Dr Sallyanne Kuster  HPI:  Pleasant 67 y/o obese AA female with a history of CAD, s/p prior LAD PCI. She ultimately had CABG x 1 with an LIMA-LAD on 08/17/17 for ISR. Her EF was 55-60% by echo. She has a history of PAF and has had a couple episodes of tachycardia we presume is secondary to PAF although the rhythm has not been documented. Her beta blocker was increased for this reason a few weeks ago and she is seen today for follow up. She tells me she has been doing well since her beat blocker was increased.    Current Outpatient Medications  Medication Sig Dispense Refill  . aspirin EC 81 MG tablet Take 81 mg by mouth daily.    . furosemide (LASIX) 40 MG tablet Take 1 tablet (40 mg total) by mouth daily. 90 tablet 3  . glimepiride (AMARYL) 2 MG tablet Take 2 mg by mouth daily with breakfast.    . insulin NPH-regular Human (NOVOLIN 70/30) (70-30) 100 UNIT/ML injection Inject 30-34 Units into the skin 2 (two) times daily with a meal. Prior to surgery, she took 42 units in the morning 20 units in the evening. As of 08/23/2017, she is on 20 units in am and 20 units in the pm. Her Insulin gradually needs to be increased to pre surgery doses as glucose levels allow. (Patient taking differently: Inject 20 Units into the skin 2 (two) times daily before a meal. ) 10 mL 11  . lisinopril-hydrochlorothiazide (PRINZIDE,ZESTORETIC) 20-25 MG per tablet Take 1 tablet by mouth daily.    . metoprolol tartrate (LOPRESSOR) 25 MG tablet Take 1 tablet (25 mg total) by mouth 3 (three) times daily. 60 tablet 11  . pantoprazole (PROTONIX) 40 MG tablet TAKE 1 TABLET BY MOUTH EVERY DAY (Patient taking differently: Take 40 mg by mouth once a day) 90 tablet 2  . potassium chloride (MICRO-K) 10 MEQ CR capsule Take 1 capsule (10 mEq total) by mouth daily. 30 capsule 6  . simvastatin (ZOCOR) 40 MG tablet Take 40  mg by mouth at bedtime.     . Vitamin D, Cholecalciferol, 1000 units TABS Take 1,000 Units by mouth every morning.     . warfarin (COUMADIN) 7.5 MG tablet Take 7.5 mg by mouth daily. 7.5 mg in the evening on Sun/Tues/Thurs/Sat and 3.75 mg on Mon/Wed/Fri     No current facility-administered medications for this visit.     Allergies  Allergen Reactions  . Levofloxacin Itching  . Morphine And Related Itching    Past Medical History:  Diagnosis Date  . Arthritis    "knees" (02/05/2016)  . Cancer ([place]) 5/10   LUL Vats   . Coronary artery disease   . GERD (gastroesophageal reflux disease)    tx meds  . H/O hiatal hernia   . History of blood transfusion "several"   last april 2015 s/p knee surgery (02/05/2016)  . Hypercholesteremia   . Hypertension   . Lung cancer ([place])    2010, surgery 18% left lung no radiation or chemo  . Non-STEMI (non-ST elevated myocardial infarction) ([place]) 04/07/2012   See cath results below  . Paroxysmal atrial fibrillation ([place]) 04/07/2012   On Warfarin  . Peripheral vascular disease ([place]) 8/12   Lt SFA PTA  . Presence of stent in LAD coronary artery 04/07/2012   Xience Expedition DES 2.75 mm x 18 mm (dilated to  3.0 mm)  . Sleep apnea    does not wear CPAP  . Type II diabetes mellitus (HCC)    insulin dependent    Social History   Socioeconomic History  . Marital status: Single    Spouse name: Not on file  . Number of children: Not on file  . Years of education: Not on file  . Highest education level: Not on file  Social Needs  . Financial resource strain: Not on file  . Food insecurity - worry: Not on file  . Food insecurity - inability: Not on file  . Transportation needs - medical: Not on file  . Transportation needs - non-medical: Not on file  Occupational History  . Occupation: Retired  Tobacco Use  . Smoking status: Former Smoker    Packs/day: 1.00    Years: 20.00    Pack years: 20.00    Types: Cigarettes    Last attempt to quit:  09/27/1984    Years since quitting: 33.1  . Smokeless tobacco: Never Used  Substance and Sexual Activity  . Alcohol use: No  . Drug use: No  . Sexual activity: Not on file  Other Topics Concern  . Not on file  Social History Narrative   Patient lives alone.     Family History  Problem Relation Age of Onset  . Hypertension Mother   . Coronary artery disease Brother   . Hypertension Sister      Review of Systems: General: negative for chills, fever, night sweats or weight changes.  Cardiovascular: negative for chest pain, dyspnea on exertion, edema, orthopnea, palpitations, paroxysmal nocturnal dyspnea or shortness of breath Dermatological: negative for rash Respiratory: negative for cough or wheezing Urologic: negative for hematuria Abdominal: negative for nausea, vomiting, diarrhea, bright red blood per rectum, melena, or hematemesis Neurologic: negative for visual changes, syncope, or dizziness All other systems reviewed and are otherwise negative except as noted above.    Blood pressure (!) 142/70, pulse 74, height 5\' 3"  (1.6 m), weight 272 lb (123.4 kg).  General appearance: alert, cooperative, no distress and morbidly obese Neck: no carotid bruit and no JVD Lungs: clear to auscultation bilaterally Heart: regular rate and rhythm Extremities: no edema Skin: Skin color, texture, turgor normal. No rashes or lesions Neurologic: Grossly normal  EKG NSR, RBBB  ASSESSMENT AND PLAN:   Paroxysmal atrial fibrillation Some recurrent episodes by her history since CABG, NSR today.  S/P CABG x 1 CABG x 1 08/17/17 secondary to LAD ISR  Chronic anticoagulation Coumadin rx  Morbid obesity BMI 48. I discussed considering a sleep study but she tells me she had one 10 yrs ago and was told she only had "mild" sleep apnea.   Insulin dependent diabetes mellitus ([place]) .  RBBB .   PLAN  Continue current beta blocker dose. I told her this may be secondary to her CABG and may  calm down over the next few months.   I suspect she has sleep apnea but she did not seem interested in pursuing this.   I reviewed labs done at her PCP recently- lytes WNL. Her TSH was normal in Nov 2018.   She'll keep her follow up with Dr Sallyanne Kuster in April.   Kerin Ransom PA-C 10/31/2017 9:57 AM

## 2017-10-31 NOTE — Assessment & Plan Note (Signed)
Coumadin rx

## 2017-10-31 NOTE — Patient Instructions (Addendum)
Your physician recommends that you continue on your current medications as directed. Please refer to the Current Medication list given to you today.  Your physician recommends that you schedule a follow-up appointment as scheduled with Dr. Sallyanne Kuster - April 15 @ 2:20pm  Happy Birthday!

## 2017-11-01 ENCOUNTER — Other Ambulatory Visit: Payer: Self-pay | Admitting: *Deleted

## 2017-11-01 NOTE — Patient Outreach (Signed)
[facility]) Care Management  11/01/2017  AZRIELLA MATTIA Aug 21, 1951 [id number]   Weekly transition of care call placed to member, she report she is doing "ok."  Denies any chest pain or discomfort.  Report some medication changes, denies having any palpitations/atrial fibrillation episodes since changes.  Report compliance with medications and daily weights.  Still have not gotten a blood pressure machine for self monitoring.  She report she will start cardiac rehab in March.  Agrees to home visit next week.  If no further needs for community nurse, will transition to health coach.  THN CM Care Plan Problem One     Most Recent Value  Care Plan Problem One  Risk of readmission related to complications of CABG as evidenced by recent surgery requiring short state at rehab facility  Role Documenting the Problem One  Care Management [place] for Problem One  Not Active  [facility] Long Term Goal   Member will not be readmitted to acute care facility within 31 days of discharge  [facility] Long Term Goal Start Date  09/15/17  [facility] Long Term Goal Met Date  11/01/17  Interventions for Problem One Long Term Goal  Verified member has follow up appointment with surgeon next week. Confirmed she has transportation.  Advised of importance attending appointment.  THN CM Short Term Goal #3  Member will weigh and record readings daily over the next 4 weeks  THN CM Short Term Goal #3 Start Date  10/03/17  [facility] CM Short Term Goal #3 Met Date  11/01/17  Interventions for Short [place] Goal #3  Provided member with [facility] calendar tool book including weight logs.  Verified member has working scale.     [name], [place], MSN [place] [phone]

## 2017-11-02 ENCOUNTER — Ambulatory Visit: Payer: Medicare HMO | Admitting: Cardiology

## 2017-11-03 DIAGNOSIS — I2510 Atherosclerotic heart disease of native coronary artery without angina pectoris: Secondary | ICD-10-CM | POA: Diagnosis not present

## 2017-11-03 DIAGNOSIS — E1122 Type 2 diabetes mellitus with diabetic chronic kidney disease: Secondary | ICD-10-CM | POA: Diagnosis not present

## 2017-11-03 DIAGNOSIS — E1151 Type 2 diabetes mellitus with diabetic peripheral angiopathy without gangrene: Secondary | ICD-10-CM | POA: Diagnosis not present

## 2017-11-03 DIAGNOSIS — I5042 Chronic combined systolic (congestive) and diastolic (congestive) heart failure: Secondary | ICD-10-CM | POA: Diagnosis not present

## 2017-11-03 DIAGNOSIS — D631 Anemia in chronic kidney disease: Secondary | ICD-10-CM | POA: Diagnosis not present

## 2017-11-03 DIAGNOSIS — I130 Hypertensive heart and chronic kidney disease with heart failure and stage 1 through stage 4 chronic kidney disease, or unspecified chronic kidney disease: Secondary | ICD-10-CM | POA: Diagnosis not present

## 2017-11-03 DIAGNOSIS — I480 Paroxysmal atrial fibrillation: Secondary | ICD-10-CM | POA: Diagnosis not present

## 2017-11-03 DIAGNOSIS — N183 Chronic kidney disease, stage 3 (moderate): Secondary | ICD-10-CM | POA: Diagnosis not present

## 2017-11-03 DIAGNOSIS — G4733 Obstructive sleep apnea (adult) (pediatric): Secondary | ICD-10-CM | POA: Diagnosis not present

## 2017-11-04 ENCOUNTER — Ambulatory Visit: Payer: Self-pay | Admitting: *Deleted

## 2017-11-04 DIAGNOSIS — E1151 Type 2 diabetes mellitus with diabetic peripheral angiopathy without gangrene: Secondary | ICD-10-CM | POA: Diagnosis not present

## 2017-11-04 DIAGNOSIS — I5042 Chronic combined systolic (congestive) and diastolic (congestive) heart failure: Secondary | ICD-10-CM | POA: Diagnosis not present

## 2017-11-04 DIAGNOSIS — G4733 Obstructive sleep apnea (adult) (pediatric): Secondary | ICD-10-CM | POA: Diagnosis not present

## 2017-11-04 DIAGNOSIS — D631 Anemia in chronic kidney disease: Secondary | ICD-10-CM | POA: Diagnosis not present

## 2017-11-04 DIAGNOSIS — I480 Paroxysmal atrial fibrillation: Secondary | ICD-10-CM | POA: Diagnosis not present

## 2017-11-04 DIAGNOSIS — E1122 Type 2 diabetes mellitus with diabetic chronic kidney disease: Secondary | ICD-10-CM | POA: Diagnosis not present

## 2017-11-04 DIAGNOSIS — N183 Chronic kidney disease, stage 3 (moderate): Secondary | ICD-10-CM | POA: Diagnosis not present

## 2017-11-04 DIAGNOSIS — I2510 Atherosclerotic heart disease of native coronary artery without angina pectoris: Secondary | ICD-10-CM | POA: Diagnosis not present

## 2017-11-04 DIAGNOSIS — I130 Hypertensive heart and chronic kidney disease with heart failure and stage 1 through stage 4 chronic kidney disease, or unspecified chronic kidney disease: Secondary | ICD-10-CM | POA: Diagnosis not present

## 2017-11-07 DIAGNOSIS — N183 Chronic kidney disease, stage 3 (moderate): Secondary | ICD-10-CM | POA: Diagnosis not present

## 2017-11-07 DIAGNOSIS — I480 Paroxysmal atrial fibrillation: Secondary | ICD-10-CM | POA: Diagnosis not present

## 2017-11-07 DIAGNOSIS — I5042 Chronic combined systolic (congestive) and diastolic (congestive) heart failure: Secondary | ICD-10-CM | POA: Diagnosis not present

## 2017-11-07 DIAGNOSIS — E1151 Type 2 diabetes mellitus with diabetic peripheral angiopathy without gangrene: Secondary | ICD-10-CM | POA: Diagnosis not present

## 2017-11-07 DIAGNOSIS — I130 Hypertensive heart and chronic kidney disease with heart failure and stage 1 through stage 4 chronic kidney disease, or unspecified chronic kidney disease: Secondary | ICD-10-CM | POA: Diagnosis not present

## 2017-11-07 DIAGNOSIS — G4733 Obstructive sleep apnea (adult) (pediatric): Secondary | ICD-10-CM | POA: Diagnosis not present

## 2017-11-07 DIAGNOSIS — D631 Anemia in chronic kidney disease: Secondary | ICD-10-CM | POA: Diagnosis not present

## 2017-11-07 DIAGNOSIS — I2510 Atherosclerotic heart disease of native coronary artery without angina pectoris: Secondary | ICD-10-CM | POA: Diagnosis not present

## 2017-11-07 DIAGNOSIS — E1122 Type 2 diabetes mellitus with diabetic chronic kidney disease: Secondary | ICD-10-CM | POA: Diagnosis not present

## 2017-11-09 DIAGNOSIS — I2510 Atherosclerotic heart disease of native coronary artery without angina pectoris: Secondary | ICD-10-CM | POA: Diagnosis not present

## 2017-11-09 DIAGNOSIS — G4733 Obstructive sleep apnea (adult) (pediatric): Secondary | ICD-10-CM | POA: Diagnosis not present

## 2017-11-09 DIAGNOSIS — E1151 Type 2 diabetes mellitus with diabetic peripheral angiopathy without gangrene: Secondary | ICD-10-CM | POA: Diagnosis not present

## 2017-11-09 DIAGNOSIS — I130 Hypertensive heart and chronic kidney disease with heart failure and stage 1 through stage 4 chronic kidney disease, or unspecified chronic kidney disease: Secondary | ICD-10-CM | POA: Diagnosis not present

## 2017-11-09 DIAGNOSIS — D631 Anemia in chronic kidney disease: Secondary | ICD-10-CM | POA: Diagnosis not present

## 2017-11-09 DIAGNOSIS — N183 Chronic kidney disease, stage 3 (moderate): Secondary | ICD-10-CM | POA: Diagnosis not present

## 2017-11-09 DIAGNOSIS — E1122 Type 2 diabetes mellitus with diabetic chronic kidney disease: Secondary | ICD-10-CM | POA: Diagnosis not present

## 2017-11-09 DIAGNOSIS — I480 Paroxysmal atrial fibrillation: Secondary | ICD-10-CM | POA: Diagnosis not present

## 2017-11-09 DIAGNOSIS — I5042 Chronic combined systolic (congestive) and diastolic (congestive) heart failure: Secondary | ICD-10-CM | POA: Diagnosis not present

## 2017-11-10 ENCOUNTER — Other Ambulatory Visit: Payer: Self-pay | Admitting: *Deleted

## 2017-11-10 NOTE — Patient Outreach (Signed)
[facility]) Care Management   11/10/2017  [name] Oct 17, 1950 [id number]  [name] is an 67 y.o. female  Subjective:   Member alert and oriented x3, denies chest pain or discomfort.  Report compliance with medications.  State she was told by cardiology to only take Potassium and Lasix on Monday/Wednesday/Friday.  Scheduled to start cardiac rehab on 3/7.    Objective:   Review of Systems  Constitutional: Negative.   HENT: Negative.   Eyes: Negative.   Respiratory: Negative.   Cardiovascular: Negative.   Gastrointestinal: Negative.   Genitourinary: Negative.   Musculoskeletal: Negative.   Skin: Negative.   Neurological: Negative.   Endo/Heme/Allergies: Negative.   Psychiatric/Behavioral: Negative.     Physical Exam  Constitutional: She is oriented to person, place, and time. She appears well-developed and well-nourished.  Cardiovascular: Normal heart sounds.  Irregular  Musculoskeletal: Normal range of motion.  Neurological: She is alert and oriented to person, place, and time.  Skin: Skin is warm and dry.   BP (!) 136/58   Pulse 60   Resp 18   Wt 268 lb 6.4 oz (121.7 kg)   SpO2 98%   BMI 47.54 kg/m   Encounter Medications:   Outpatient Encounter Medications as of 11/10/2017  Medication Sig Note  . aspirin EC 81 MG tablet Take 81 mg by mouth daily.   . furosemide (LASIX) 40 MG tablet Take 1 tablet (40 mg total) by mouth daily.   Marland Kitchen glimepiride (AMARYL) 2 MG tablet Take 2 mg by mouth daily with breakfast.   . insulin NPH-regular Human (NOVOLIN 70/30) (70-30) 100 UNIT/ML injection Inject 30-34 Units into the skin 2 (two) times daily with a meal. Prior to surgery, she took 42 units in the morning 20 units in the evening. As of 08/23/2017, she is on 20 units in am and 20 units in the pm. Her Insulin gradually needs to be increased to pre surgery doses as glucose levels allow. (Patient taking differently: Inject 20 Units into the skin 2  (two) times daily before a meal. ) 09/23/2017: Units endorsed by the patient  . lisinopril-hydrochlorothiazide (PRINZIDE,ZESTORETIC) 20-25 MG per tablet Take 1 tablet by mouth daily.   . metoprolol tartrate (LOPRESSOR) 25 MG tablet Take 1 tablet (25 mg total) by mouth 3 (three) times daily.   . pantoprazole (PROTONIX) 40 MG tablet TAKE 1 TABLET BY MOUTH EVERY DAY (Patient taking differently: Take 40 mg by mouth once a day)   . potassium chloride (MICRO-K) 10 MEQ CR capsule Take 1 capsule (10 mEq total) by mouth daily.   . simvastatin (ZOCOR) 40 MG tablet Take 40 mg by mouth at bedtime.    . Vitamin D, Cholecalciferol, 1000 units TABS Take 1,000 Units by mouth every morning.    . warfarin (COUMADIN) 7.5 MG tablet Take 7.5 mg by mouth daily. 7.5 mg in the evening on Sun/Tues/Thurs/Sat and 3.75 mg on Mon/Wed/Fri 09/23/2017: Regimen endorsed by the patient (she even verified the color of the tablet)   No facility-administered encounter medications on file as of 11/10/2017.     Functional Status:   In your present state of health, do you have any difficulty performing the following activities: 10/03/2017 08/26/2017  Hearing? N N  Vision? N N  Difficulty concentrating or making decisions? N N  Walking or climbing stairs? Y Y  Dressing or bathing? N N  Doing errands, shopping? Y N  Comment Family helps -  Preparing Food and eating ? Tempie Donning  Using the Toilet? N N  In the past six months, have you accidently leaked urine? N Y  Do you have problems with loss of bowel control? N N  Managing your Medications? N N  Managing your Finances? N N  Housekeeping or managing your Housekeeping? Y Y  Some recent data might be hidden    Fall/Depression Screening:    Fall Risk  10/03/2017 08/26/2017 09/17/2014  Falls in the past year? No Yes No  Number falls in past yr: - 1 -  Injury with Fall? - No -  Risk for fall due to : - History of fall(s);Impaired balance/gait;Impaired mobility Impaired mobility;Impaired  balance/gait  Follow up - Education provided;Falls prevention discussed -   PHQ 2/9 Scores 10/03/2017 08/26/2017 02/26/2014 01/17/2014 12/17/2013 11/21/2013  PHQ - 2 Score 0 0 0 0 0 0    Assessment:    Met with member at scheduled time.  She continues to improve, no signs/symptoms of complications of surgery.  Denies any palpitations of increased heart rate.  Home health PT and nursing completed their programs this week.  She reports daily weights, denies obtaining blood pressure machine to record blood pressure as well as heart rate.  Aware that this care manager will provide.  This care manager discussed member progress, inquired about involvement with health coach.  She agrees.  Denies any urgent concerns at this time.  Plan:   Will provide member with blood pressure monitor and follow up within 2 weeks to confirm understanding of use.  If remain stable at that time, will place referral to Health Coach.  THN CM Care Plan Problem One     Most Recent Value  Care Plan Problem One  Risk for ED visit related to A-fib as evidenced by history of irregular heart rate  Role Documenting the Problem One  Care Management [place] for Problem One  Active  THN CM Short Term Goal #1   Member will have blood pressure monitor within the next 2 weeks  THN CM Short Term Goal #1 Start Date  11/10/17  Interventions for Short Term Goal #1  This care manager will provide member with blood pressure monitor.  Advised of importance of monitoring blood pressure and heart rate on a regular basis     [name], Therapist, sports, MSN [place] Manager [phone]

## 2017-11-23 ENCOUNTER — Encounter: Payer: Self-pay | Admitting: Emergency Medicine

## 2017-11-23 ENCOUNTER — Ambulatory Visit: Payer: Medicare HMO | Admitting: Emergency Medicine

## 2017-11-23 ENCOUNTER — Other Ambulatory Visit: Payer: Self-pay | Admitting: *Deleted

## 2017-11-23 DIAGNOSIS — R911 Solitary pulmonary nodule: Secondary | ICD-10-CM | POA: Diagnosis not present

## 2017-11-23 DIAGNOSIS — R918 Other nonspecific abnormal finding of lung field: Secondary | ICD-10-CM | POA: Insufficient documentation

## 2017-11-23 NOTE — Patient Outreach (Signed)
[facility]) Care Management  11/23/2017  [name] [birth year] [id number]   Blood pressure monitor delivered to member for continued self monitoring.  She now has blood pressure monitor, glucose meter, and scale.  Continues to deny any further community needs.  She agrees to transition to Engineer, maintenance.    Referral placed to health coach, discipline closure letter sent to primary MD.   [facility] [name] Care Plan Problem One     Most Recent Value  Care Plan Problem One  Risk for ED visit related to A-fib as evidenced by history of irregular heart rate  Role Documenting the Problem One  Care Management [name] for Problem One  Not Active  [name] Short Term Goal #1   Member will have blood pressure monitor within the next 2 weeks  [name] Short Term Goal #1 Start Date  11/10/17  [name] [name] Short Term Goal #1 Met Date  11/23/17  Interventions for Short Term Goal #1  This care manager will provide member with blood pressure monitor.  Advised of importance of monitoring blood pressure and heart rate on a regular basis      [name], Therapist, sports, MSN [name] Manager [phone]

## 2017-11-23 NOTE — Patient Instructions (Addendum)
We should repeat your CT scan of the chest without contrast in November 2019 to look for interval change in your small pulmonary nodule.  Follow with Dr Lamonte Sakai in November after the CT scan to review the results together.

## 2017-11-23 NOTE — Progress Notes (Signed)
Subjective:    Patient ID: [name], female    DOB: [birth year], 67 y.o.   MRN: [id number]  HPI 67 year old former smoker (20 pk-yrs) gt with a history of obesity, coronary disease, GERD with a hiatal hernia, atrial fibrillation, untreated obstructive sleep apnea, diabetes.  She has a history of left upper lobe lobectomy 2010 for stage I a non-small cell lung cancer, has been seen by Dr Gwenette Greet before, surgery by Dr Arlyce Dice. She underwent single vessel CABG in 07/2017 w Dr Roxan Hockey.   She underwent a cardiac CT prior to her CABG 07/2017 that I reviewed.  The lung windows show some bibasilar atelectasis and a 2 mm right lower lobe nodule.     Review of Systems  Constitutional: Negative for fever and unexpected weight change.  HENT: Negative for congestion, dental problem, ear pain, nosebleeds, postnasal drip, rhinorrhea, sinus pressure, sneezing, sore throat and trouble swallowing.   Eyes: Negative for redness and itching.  Respiratory: Positive for shortness of breath. Negative for cough, chest tightness and wheezing.   Cardiovascular: Positive for palpitations. Negative for leg swelling.  Gastrointestinal: Negative for nausea and vomiting.  Genitourinary: Negative for dysuria.  Musculoskeletal: Negative for joint swelling.  Skin: Negative for rash.  Neurological: Negative for headaches.  Hematological: Does not bruise/bleed easily.  Psychiatric/Behavioral: Negative for dysphoric mood. The patient is not nervous/anxious.    Past Medical History:  Diagnosis Date  . Arthritis    "knees" (02/05/2016)  . Cancer ([place]) 5/10   LUL Vats   . Coronary artery disease   . GERD (gastroesophageal reflux disease)    tx meds  . H/O hiatal hernia   . History of blood transfusion "several"   last april 2015 s/p knee surgery (02/05/2016)  . Hypercholesteremia   . Hypertension   . Lung cancer ([place])    2010, surgery 18% left lung no radiation or chemo  . Non-STEMI (non-ST elevated  myocardial infarction) ([address] [place]) 04/07/2012   See cath results below  . Paroxysmal atrial fibrillation ([place]) 04/07/2012   On Warfarin  . Peripheral vascular disease ([place]) 8/12   Lt SFA PTA  . Presence of stent in LAD coronary artery 04/07/2012   [employer]ience [employer]ition DES 2.75 mm x 18 mm (dilated to 3.0 mm)  . Sleep apnea    does not wear CPAP  . Type II diabetes mellitus ([facility])    insulin dependent     Family History  Problem Relation Age of Onset  . Hypertension Mother   . Coronary artery disease Brother   . Hypertension Sister      Social History   Socioeconomic History  . Marital status: Single    Spouse name: Not on file  . Number of children: Not on file  . Years of education: Not on file  . Highest education level: Not on file  Social Needs  . Financial resource strain: Not on file  . Food insecurity - worry: Not on file  . Food insecurity - inability: Not on file  . Transportation needs - medical: Not on file  . Transportation needs - non-medical: Not on file  Occupational History  . Occupation: Retired  Tobacco Use  . Smoking status: Former Smoker    Packs/day: 1.00    Years: 20.00    Pack years: 20.00    Types: Cigarettes    Last attempt to quit: 09/27/1984    Years since quitting: 33.1  . Smokeless tobacco: Never Used  Substance and Sexual Activity  . Alcohol  use: No  . Drug use: No  . Sexual activity: Not on file  Other Topics Concern  . Not on file  Social History Narrative   Patient lives alone.  worked Orthoptist, the [employer] [place]   Allergies  Allergen Reactions  . Levofloxacin Itching  . Morphine And Related Itching     Outpatient Medications Prior to Visit  Medication Sig Dispense Refill  . aspirin EC 81 MG tablet Take 81 mg by mouth daily.    . furosemide (LASIX) 40 MG tablet Take 1 tablet (40 mg total) by mouth daily. 90 tablet 3  . glimepiride (AMARYL) 2 MG tablet Take 2 mg by mouth daily with breakfast.    . insulin  NPH-regular Human (NOVOLIN 70/30) (70-30) 100 UNIT/ML injection Inject 30-34 Units into the skin 2 (two) times daily with a meal. Prior to surgery, she took 42 units in the morning 20 units in the evening. As of 08/23/2017, she is on 20 units in am and 20 units in the pm. Her Insulin gradually needs to be increased to pre surgery doses as glucose levels allow. (Patient taking differently: Inject 20 Units into the skin 2 (two) times daily before a meal. ) 10 mL 11  . lisinopril-hydrochlorothiazide (PRINZIDE,ZESTORETIC) 20-25 MG per tablet Take 1 tablet by mouth daily.    . metoprolol tartrate (LOPRESSOR) 25 MG tablet Take 1 tablet (25 mg total) by mouth 3 (three) times daily. 60 tablet 11  . pantoprazole (PROTONIX) 40 MG tablet TAKE 1 TABLET BY MOUTH EVERY DAY (Patient taking differently: Take 40 mg by mouth once a day) 90 tablet 2  . potassium chloride (MICRO-K) 10 MEQ CR capsule Take 1 capsule (10 mEq total) by mouth daily. 30 capsule 6  . simvastatin (ZOCOR) 40 MG tablet Take 40 mg by mouth at bedtime.     . Vitamin D, Cholecalciferol, 1000 units TABS Take 1,000 Units by mouth every morning.     . warfarin (COUMADIN) 7.5 MG tablet Take 7.5 mg by mouth daily. 7.5 mg in the evening on Sun/Tues/Thurs/Sat and 3.75 mg on Mon/Wed/Fri     No facility-administered medications prior to visit.         Objective:   Physical Exam Vitals:   11/23/17 1526  BP: 132/86  Pulse: 68  SpO2: 100%  Weight: 277 lb (125.6 kg)  Height: 5\' 3"  (1.6 m)   Gen: Pleasant, obese woman, in no distress,  normal affect  ENT: No lesions,  mouth clear,  oropharynx clear, no postnasal drip  Neck: No JVD, no stridor  Lungs: No use of accessory muscles, clear bilaterally  Cardiovascular: RRR, heart sounds normal, no murmur or gallops, no peripheral edema  Musculoskeletal: No deformities, no cyanosis or clubbing  Neuro: alert, non focal  Skin: Warm, no lesions or rash      Assessment & Plan:  Pulmonary  nodule/lesion, solitary 2 mm right lower lobe nodule without calcification.  Because she stopped smoking over 30 years ago an argument can be made to defer serial CT scans.  I am not comfortable with this plan since she has had a history of a stage I a lung cancer before.  I believe she needs serial follow-up.  We will follow her annually for interval change.  Next scan to be done  November 2019.  We should repeat your CT scan of the chest without contrast in November 2019 to look for interval change in your small pulmonary nodule.  Follow with Dr Lamonte Sakai  in November after the CT scan to review the results together.   Baltazar Apo, MD, PhD 11/23/2017, 4:10 PM [facility] [phone] or if no answer [phone]

## 2017-11-23 NOTE — Assessment & Plan Note (Signed)
2 mm right lower lobe nodule without calcification.  Because she stopped smoking over 30 years ago an argument can be made to defer serial CT scans.  I am not comfortable with this plan since she has had a history of a stage I a lung cancer before.  I believe she needs serial follow-up.  We will follow her annually for interval change.  Next scan to be done  November 2019.  We should repeat your CT scan of the chest without contrast in November 2019 to look for interval change in your small pulmonary nodule.  Follow with Dr Lamonte Sakai in November after the CT scan to review the results together.

## 2017-11-24 ENCOUNTER — Encounter: Payer: Self-pay | Admitting: *Deleted

## 2017-11-28 ENCOUNTER — Other Ambulatory Visit: Payer: Self-pay | Admitting: Cardiovascular Disease

## 2017-11-29 ENCOUNTER — Other Ambulatory Visit: Payer: Self-pay | Admitting: *Deleted

## 2017-11-29 NOTE — Patient Outreach (Signed)
[facility] Care Management  11/29/2017  ZAN TRISKA 1951/07/01 [id number]   RN Health Coach Introductory Call  Referral Date:  11/23/2017 Referral Source:  Transfer from [facility] Reason for Referral:  Continued Disease Management Education Insurance:  [facility] Medicare   Outreach Attempt:  Successful telephone outreach to patient for introductory call.  HIPAA verified with patient.  RN Health Coach introduced self and explained Health Coach role.  Patient verbally agreed to monthly telephone outreaches.  Plan:  RN Health Coach will make outreach attempt for initial telephone assessment within the next 10 business days.  [place] [phone] [email or web address]

## 2017-11-30 ENCOUNTER — Telehealth (HOSPITAL_COMMUNITY): Payer: Self-pay

## 2017-11-30 NOTE — Telephone Encounter (Signed)
Cardiac Rehab Medication Review by a Pharmacist  Does the patient  feel that his/her medications are working for him/her?  yes  Has the patient been experiencing any side effects to the medications prescribed?  no  Does the patient measure his/her own blood pressure or blood glucose at home?  yes   Does the patient have any problems obtaining medications due to transportation or finances?   no  Understanding of regimen: good Understanding of indications: good Potential of compliance: good    Pharmacist comments: Patient was aware of medication indications and timing throughout the day. She reports taking her BP at home. Coumadin clinic monitors her warfarin regimen.    [name] Damiana Berrian 11/30/2017 5:43 PM

## 2017-12-01 ENCOUNTER — Encounter (HOSPITAL_COMMUNITY): Payer: Self-pay

## 2017-12-01 ENCOUNTER — Encounter (HOSPITAL_COMMUNITY)
Admission: RE | Admit: 2017-12-01 | Discharge: 2017-12-01 | Disposition: A | Discharge status: Home / Self Care | Payer: Medicare HMO | Source: Ambulatory Visit | Attending: Cardiovascular Disease | Admitting: Cardiovascular Disease

## 2017-12-01 VITALS — Ht 63.0 in | Wt 272.9 lb

## 2017-12-01 DIAGNOSIS — Z951 Presence of aortocoronary bypass graft: Secondary | ICD-10-CM

## 2017-12-01 DIAGNOSIS — Z48812 Encounter for surgical aftercare following surgery on the circulatory system: Secondary | ICD-10-CM | POA: Diagnosis not present

## 2017-12-01 NOTE — Progress Notes (Signed)
Cardiac Individual Treatment Plan  Patient Details  Name: [name] MRN: [id number] Date of Birth: 1951/04/05 Referring Provider:     CARDIAC REHAB PHASE II ORIENTATION from 12/01/2017 in [facility]  Referring Provider  Croitoru, Dani Gobble MD      Initial Encounter Date:    [place] from 12/01/2017 in [facility]  Date  12/01/17  Referring Provider  Croitoru, Mihai MD      Visit Diagnosis: S/P CABG x 1  Patient's Home Medications on Admission:  Current Outpatient Medications:  .  aspirin EC 81 MG tablet, Take 81 mg by mouth daily., Disp: , Rfl:  .  furosemide (LASIX) 40 MG tablet, Take 1 tablet (40 mg total) by mouth daily., Disp: 90 tablet, Rfl: 3 .  glimepiride (AMARYL) 2 MG tablet, Take 2 mg by mouth daily with breakfast., Disp: , Rfl:  .  insulin NPH-regular Human (NOVOLIN 70/30) (70-30) 100 UNIT/ML injection, Inject 30-34 Units into the skin 2 (two) times daily with a meal. Prior to surgery, she took 42 units in the morning 20 units in the evening. As of 08/23/2017, she is on 20 units in am and 20 units in the pm. Her Insulin gradually needs to be increased to pre surgery doses as glucose levels allow. (Patient taking differently: Inject 20 Units into the skin 2 (two) times daily before a meal. ), Disp: 10 mL, Rfl: 11 .  lisinopril-hydrochlorothiazide (PRINZIDE,ZESTORETIC) 20-25 MG per tablet, Take 1 tablet by mouth daily., Disp: , Rfl:  .  metoprolol tartrate (LOPRESSOR) 25 MG tablet, Take 1 tablet (25 mg total) by mouth 3 (three) times daily., Disp: 60 tablet, Rfl: 11 .  pantoprazole (PROTONIX) 40 MG tablet, TAKE 1 TABLET BY MOUTH EVERY DAY (Patient taking differently: Take 40 mg by mouth once a day), Disp: 90 tablet, Rfl: 2 .  potassium chloride (MICRO-K) 10 MEQ CR capsule, Take 1 capsule (10 mEq total) by mouth daily., Disp: 30 capsule, Rfl: 6 .  simvastatin (ZOCOR) 40 MG tablet, Take 40 mg  by mouth at bedtime. , Disp: , Rfl:  .  Vitamin D, Cholecalciferol, 1000 units TABS, Take 1,000 Units by mouth every morning. , Disp: , Rfl:  .  warfarin (COUMADIN) 7.5 MG tablet, Take 7.5 mg by mouth daily. 7.5 mg in the evening on Sun/Tues/Thurs/Sat and 3.75 mg on Mon/Wed/Fri, Disp: , Rfl:  .  warfarin (COUMADIN) 7.5 MG tablet, TAKE 1/2 TO 1 TABLET DAILY AS DIRECTED BY COUMADIN CLINIC, Disp: 90 tablet, Rfl: 0  Past Medical History: Past Medical History:  Diagnosis Date  . Arthritis    "knees" (02/05/2016)  . Cancer ([place]) 5/10   LUL [facility]ats   . Coronary artery disease   . GERD (gastroesophageal reflux disease)    tx meds  . H/O hiatal hernia   . History of blood transfusion "several"   last april 2015 s/p knee surgery (02/05/2016)  . Hypercholesteremia   . Hypertension   . Lung cancer ([place])    2010, surgery 18% left lung no radiation or chemo  . Non-STEMI (non-ST elevated myocardial infarction) ([place]) 04/07/2012   See cath results below  . Paroxysmal atrial fibrillation ([place]) 04/07/2012   On Warfarin  . Peripheral vascular disease ([place]) 8/12   Lt SFA PTA  . Presence of stent in LAD coronary artery 04/07/2012   [employer]ition DES 2.75 mm x 18 mm (dilated to 3.0 mm)  . Sleep apnea  does not wear CPAP  . Type II diabetes mellitus ([facility])    insulin dependent    Tobacco Use: Social History   Tobacco Use  Smoking Status Former Smoker  . Packs/day: 1.00  . Years: 20.00  . Pack years: 20.00  . Types: Cigarettes  . Last attempt to quit: 09/27/1984  . Years since quitting: 33.2  Smokeless Tobacco Never Used    Labs: Recent Review Flowsheet Data    Labs for ITP Cardiac and Pulmonary Rehab Latest Ref Rng & Units 08/17/2017 08/17/2017 08/17/2017 08/17/2017 08/17/2017   Cholestrol 0 - 200 mg/dL - - - - -   LDLCALC 0 - 99 mg/dL - - - - -   LDLDIRECT mg/dL - - - - -   HDL >40 mg/dL - - - - -   Trlycerides <150 mg/dL - - - - -   Hemoglobin A1c 4.8 - 5.6 % - - - - -   PHART 7.350 -  7.450 7.520(H) 7.426 7.422 7.363 -   PCO2ART 32.0 - 48.0 mmHg 32.9 35.6 34.8 41.2 -   HCO3 20.0 - 28.0 mmol/L 26.9 24.0 22.9 23.7 -   TCO2 22 - 32 mmol/L 28 25 24 25 24    ACIDBASEDEF 0.0 - 2.0 mmol/L - 1.0 2.0 2.0 -   O2SAT % 100.0 99.0 100.0 100.0 -      Capillary Blood Glucose: Lab Results  Component Value Date   GLUCAP 117 (H) 08/23/2017   GLUCAP 170 (H) 08/23/2017   GLUCAP 83 08/23/2017   GLUCAP 108 (H) 08/22/2017   GLUCAP 95 08/22/2017     Exercise Target Goals: Date: 12/01/17  Exercise Program Goal: Individual exercise prescription set using results from initial 6 min walk test and THRR while considering  patient's activity barriers and safety.   Exercise Prescription Goal: Initial exercise prescription builds to 30-45 minutes a day of aerobic activity, 2-3 days per week.  Home exercise guidelines will be given to patient during program as part of exercise prescription that the participant will acknowledge.  Activity Barriers & Risk Stratification: Activity Barriers & Cardiac Risk Stratification - 12/01/17 1149      Activity Barriers & Cardiac Risk Stratification   Activity Barriers  Arthritis;Joint Problems;Muscular Weakness;Deconditioning;Left Knee Replacement;Other (comment)    Comments  Severe Knee Pain and Severe Deconditioning     Cardiac Risk Stratification  High       6 Minute Walk: 6 Minute Walk    Row Name 12/01/17 1141         6 Minute Walk   Phase  Initial     Distance  151 feet     Walk Time  2.25 minutes     # of Rest Breaks  1     MPH  0.29     METS  -0.27     RPE  15     Perceived Dyspnea   3     VO2 Peak  -0.94     Symptoms  Yes (comment)     Comments  WheelChair and Gait Belt was used to assist pt with walk. Pt walked for a total of 2 mins and 25 secs. Pt took 1 rest break for 35 seconds. Pt rated dyspnea 3/4 and had fatigued.      Resting HR  68 bpm     Resting BP  108/62     Resting Oxygen Saturation   99 %     Exercise Oxygen  Saturation  during 6 min walk  100 %     Max Ex. HR  96 bpm     Max Ex. BP  118/64     2 Minute Post BP  116/58        Oxygen Initial Assessment:   Oxygen Re-Evaluation:   Oxygen Discharge (Final Oxygen Re-Evaluation):   Initial Exercise Prescription: Initial Exercise Prescription - 12/01/17 1100      Date of Initial Exercise RX and Referring Provider   Date  12/01/17    Referring Provider  Croitoru, Mihai MD      NuStep   Level  1    SPM  70    Minutes  30    METs  1      Prescription Details   Frequency (times per week)  3x    Duration  Progress to 30 minutes of continuous aerobic without signs/symptoms of physical distress      Intensity   THRR 40-80% of Max Heartrate  61-122    Ratings of Perceived Exertion  11-13    Perceived Dyspnea  0-4      Progression   Progression  Continue progressive overload as per policy without signs/symptoms or physical distress.      Resistance Training   Training Prescription  Yes    Weight  2lbs    Reps  10-15       Perform Capillary Blood Glucose checks as needed.  Exercise Prescription Changes:   Exercise Comments:   Exercise Goals and Review: Exercise Goals    Row Name 12/01/17 1155             Exercise Goals   Increase Physical Activity  Yes       Intervention  Provide advice, education, support and counseling about physical activity/exercise needs.;Develop an individualized exercise prescription for aerobic and resistive training based on initial evaluation findings, risk stratification, comorbidities and participant's personal goals.       Expected Outcomes  Short Term: Attend rehab on a regular basis to increase amount of physical activity.;Long Term: Add in home exercise to make exercise part of routine and to increase amount of physical activity.;Long Term: Exercising regularly at least 3-5 days a week.       Increase Strength and Stamina  Yes       Intervention  Provide advice, education, support and  counseling about physical activity/exercise needs.;Develop an individualized exercise prescription for aerobic and resistive training based on initial evaluation findings, risk stratification, comorbidities and participant's personal goals.       Expected Outcomes  Short Term: Increase workloads from initial exercise prescription for resistance, speed, and METs.;Short Term: Perform resistance training exercises routinely during rehab and add in resistance training at home;Long Term: Improve cardiorespiratory fitness, muscular endurance and strength as measured by increased METs and functional capacity (6MWT)       Able to understand and use rate of perceived exertion (RPE) scale  Yes       Intervention  Provide education and explanation on how to use RPE scale       Expected Outcomes  Short Term: Able to use RPE daily in rehab to express subjective intensity level;Long Term:  Able to use RPE to guide intensity level when exercising independently       Able to understand and use Dyspnea scale  Yes       Intervention  Provide education and explanation on how to use Dyspnea scale       Expected Outcomes  Short Term: Able to use  Dyspnea scale daily in rehab to express subjective sense of shortness of breath during exertion;Long Term: Able to use Dyspnea scale to guide intensity level when exercising independently       Knowledge and understanding of Target Heart Rate Range (THRR)  Yes       Intervention  Provide education and explanation of THRR including how the numbers were predicted and where they are located for reference       Expected Outcomes  Short Term: Able to state/look up THRR;Short Term: Able to use daily as guideline for intensity in rehab;Long Term: Able to use THRR to govern intensity when exercising independently       Able to check pulse independently  Yes       Intervention  Provide education and demonstration on how to check pulse in carotid and radial arteries.;Review the importance of  being able to check your own pulse for safety during independent exercise       Expected Outcomes  Short Term: Able to explain why pulse checking is important during independent exercise;Long Term: Able to check pulse independently and accurately       Understanding of Exercise Prescription  Yes       Intervention  Provide education, explanation, and written materials on patient's individual exercise prescription       Expected Outcomes  Short Term: Able to explain program exercise prescription;Long Term: Able to explain home exercise prescription to exercise independently          Exercise Goals Re-Evaluation :    Discharge Exercise Prescription (Final Exercise Prescription Changes):   Nutrition:  Target Goals: Understanding of nutrition guidelines, daily intake of sodium 1500mg , cholesterol 200mg , calories 30% from fat and 7% or less from saturated fats, daily to have 5 or more servings of fruits and vegetables.  Biometrics: Pre Biometrics - 12/01/17 1150      Pre Biometrics   Height  5\' 3"  (1.6 m)    Weight  272 lb 14.9 oz (123.8 kg)    Waist Circumference  48.5 inches    Hip Circumference  59 inches    Waist to Hip Ratio  0.82 %    BMI (Calculated)  48.36    Triceps Skinfold  25 mm    % Body Fat  54.8 %    Grip Strength  24 kg    Flexibility  0 in    Single Leg Stand  0 seconds        Nutrition Therapy Plan and Nutrition Goals:   Nutrition Assessments:   Nutrition Goals Re-Evaluation:   Nutrition Goals Re-Evaluation:   Nutrition Goals Discharge (Final Nutrition Goals Re-Evaluation):   Psychosocial: Target Goals: Acknowledge presence or absence of significant depression and/or stress, maximize coping skills, provide positive support system. Participant is able to verbalize types and ability to use techniques and skills needed for reducing stress and depression.  Initial Review & Psychosocial Screening: Initial Psych Review & Screening - 12/01/17 1308       Initial Review   Current issues with  None Identified      Family Dynamics   Good Support System?  Yes    Comments  Recently loss of brother - MI      Barriers   Psychosocial barriers to participate in program  The patient should benefit from training in stress management and relaxation.      Screening Interventions   Interventions  Encouraged to exercise    Expected Outcomes  Short Term goal: Identification and  review with participant of any Quality of Life or Depression concerns found by scoring the questionnaire.;Long Term goal: The participant improves quality of Life and PHQ9 Scores as seen by post scores and/or verbalization of changes       Quality of Life Scores: Quality of Life - 12/01/17 1151      Quality of Life Scores   Health/Function Pre  25.2 %    Socioeconomic Pre  29.29 %    Psych/Spiritual Pre  30 %    Family Pre  30 %    GLOBAL Pre  27.67 %      Scores of 19 and below usually indicate a poorer quality of life in these areas.  A difference of  2-3 points is a clinically meaningful difference.  A difference of 2-3 points in the total score of the Quality of Life Index has been associated with significant improvement in overall quality of life, self-image, physical symptoms, and general health in studies assessing change in quality of life.  PHQ-9: Recent Review Flowsheet Data    Depression screen [facility] 2/9 10/03/2017 08/26/2017 02/26/2014 01/17/2014 12/17/2013   Decreased Interest 0 0 0 0 0   Down, Depressed, Hopeless 0 0 0 0 0   PHQ - 2 Score 0 0 0 0 0     Interpretation of Total Score  Total Score Depression Severity:  1-4 = Minimal depression, 5-9 = Mild depression, 10-14 = Moderate depression, 15-19 = Moderately severe depression, 20-27 = Severe depression   Psychosocial Evaluation and Intervention:   Psychosocial Re-Evaluation:   Psychosocial Discharge (Final Psychosocial Re-Evaluation):   Vocational Rehabilitation: Provide vocational rehab  assistance to qualifying candidates.   Vocational Rehab Evaluation & Intervention: Vocational Rehab - 12/01/17 1311      Initial Vocational Rehab Evaluation & Intervention   Assessment shows need for Vocational Rehabilitation  No Pt is retired       Education: Education Goals: Education classes will be provided on a weekly basis, covering required topics. Participant will state understanding/return demonstration of topics presented.  Learning Barriers/Preferences: Learning Barriers/Preferences - 12/01/17 1150      Learning Barriers/Preferences   Learning Barriers  Sight    Learning Preferences  Verbal Instruction;Video;Written Material;Skilled Demonstration       Education Topics: Count Your Pulse:  -Group instruction provided by verbal instruction, demonstration, patient participation and written materials to support subject.  Instructors address importance of being able to find your pulse and how to count your pulse when at home without a heart monitor.  Patients get hands on experience counting their pulse with staff help and individually.   Heart Attack, Angina, and Risk Factor Modification:  -Group instruction provided by verbal instruction, video, and written materials to support subject.  Instructors address signs and symptoms of angina and heart attacks.    Also discuss risk factors for heart disease and how to make changes to improve heart health risk factors.   Functional Fitness:  -Group instruction provided by verbal instruction, demonstration, patient participation, and written materials to support subject.  Instructors address safety measures for doing things around the house.  Discuss how to get up and down off the floor, how to pick things up properly, how to safely get out of a chair without assistance, and balance training.   Meditation and Mindfulness:  -Group instruction provided by verbal instruction, patient participation, and written materials to support  subject.  Instructor addresses importance of mindfulness and meditation practice to help reduce stress and improve awareness.  Instructor also leads participants through a meditation exercise.    Stretching for Flexibility and Mobility:  -Group instruction provided by verbal instruction, patient participation, and written materials to support subject.  Instructors lead participants through series of stretches that are designed to increase flexibility thus improving mobility.  These stretches are additional exercise for major muscle groups that are typically performed during regular warm up and cool down.   Hands Only CPR:  -Group verbal, video, and participation provides a basic overview of AHA guidelines for community CPR. Role-play of emergencies allow participants the opportunity to practice calling for help and chest compression technique with discussion of AED use.   Hypertension: -Group verbal and written instruction that provides a basic overview of hypertension including the most recent diagnostic guidelines, risk factor reduction with self-care instructions and medication management.    Nutrition I class: Heart Healthy Eating:  -Group instruction provided by PowerPoint slides, verbal discussion, and written materials to support subject matter. The instructor gives an explanation and review of the Therapeutic Lifestyle Changes diet recommendations, which includes a discussion on lipid goals, dietary fat, sodium, fiber, plant stanol/sterol esters, sugar, and the components of a well-balanced, healthy diet.   Nutrition II class: Lifestyle Skills:  -Group instruction provided by PowerPoint slides, verbal discussion, and written materials to support subject matter. The instructor gives an explanation and review of label reading, grocery shopping for heart health, heart healthy recipe modifications, and ways to make healthier choices when eating out.   Diabetes Question & Answer:  -Group  instruction provided by PowerPoint slides, verbal discussion, and written materials to support subject matter. The instructor gives an explanation and review of diabetes co-morbidities, pre- and post-prandial blood glucose goals, pre-exercise blood glucose goals, signs, symptoms, and treatment of hypoglycemia and hyperglycemia, and foot care basics.   Diabetes Blitz:  -Group instruction provided by PowerPoint slides, verbal discussion, and written materials to support subject matter. The instructor gives an explanation and review of the physiology behind type 1 and type 2 diabetes, diabetes medications and rational behind using different medications, pre- and post-prandial blood glucose recommendations and Hemoglobin A1c goals, diabetes diet, and exercise including blood glucose guidelines for exercising safely.    Portion Distortion:  -Group instruction provided by PowerPoint slides, verbal discussion, written materials, and food models to support subject matter. The instructor gives an explanation of serving size versus portion size, changes in portions sizes over the last 20 years, and what consists of a serving from each food group.   Stress Management:  -Group instruction provided by verbal instruction, video, and written materials to support subject matter.  Instructors review role of stress in heart disease and how to cope with stress positively.     Exercising on Your Own:  -Group instruction provided by verbal instruction, power point, and written materials to support subject.  Instructors discuss benefits of exercise, components of exercise, frequency and intensity of exercise, and end points for exercise.  Also discuss use of nitroglycerin and activating EMS.  Review options of places to exercise outside of rehab.  Review guidelines for sex with heart disease.   Cardiac Drugs I:  -Group instruction provided by verbal instruction and written materials to support subject.  Instructor  reviews cardiac drug classes: antiplatelets, anticoagulants, beta blockers, and statins.  Instructor discusses reasons, side effects, and lifestyle considerations for each drug class.   Cardiac Drugs II:  -Group instruction provided by verbal instruction and written materials to support subject.  Instructor reviews cardiac drug classes:  angiotensin converting enzyme inhibitors (ACE-I), angiotensin II receptor blockers (ARBs), nitrates, and calcium channel blockers.  Instructor discusses reasons, side effects, and lifestyle considerations for each drug class.   Anatomy and Physiology of the Circulatory System:  Group verbal and written instruction and models provide basic cardiac anatomy and physiology, with the coronary electrical and arterial systems. Review of: AMI, Angina, Valve disease, Heart Failure, Peripheral Artery Disease, Cardiac Arrhythmia, Pacemakers, and the ICD.   Other Education:  -Group or individual verbal, written, or video instructions that support the educational goals of the cardiac rehab program.   Holiday Eating Survival Tips:  -Group instruction provided by PowerPoint slides, verbal discussion, and written materials to support subject matter. The instructor gives patients tips, tricks, and techniques to help them not only survive but enjoy the holidays despite the onslaught of food that accompanies the holidays.   Knowledge Questionnaire Score: Knowledge Questionnaire Score - 12/01/17 1151      Knowledge Questionnaire Score   Pre Score  22/24       Core Components/Risk Factors/Patient Goals at Admission: Personal Goals and Risk Factors at Admission - 12/01/17 1138      Core Components/Risk Factors/Patient Goals on Admission    Weight Management  Yes;Obesity    Intervention  Weight Management: Develop a combined nutrition and exercise program designed to reach desired caloric intake, while maintaining appropriate intake of nutrient and fiber, sodium and fats,  and appropriate energy expenditure required for the weight goal.;Weight Management: Provide education and appropriate resources to help participant work on and attain dietary goals.;Weight Management/Obesity: Establish reasonable short term and long term weight goals.;Obesity: Provide education and appropriate resources to help participant work on and attain dietary goals.    Admit Weight  272 lb 14.9 oz (123.8 kg)    Goal Weight: Short Term  265 lb (120.2 kg)    Goal Weight: Long Term  257 lb (116.6 kg)    Expected Outcomes  Short Term: Continue to assess and modify interventions until short term weight is achieved;Long Term: Adherence to nutrition and physical activity/exercise program aimed toward attainment of established weight goal;Weight Loss: Understanding of general recommendations for a balanced deficit meal plan, which promotes 1-2 lb weight loss per week and includes a negative energy balance of [phone] kcal/d;Understanding recommendations for meals to include 15-35% energy as protein, 25-35% energy from fat, 35-60% energy from carbohydrates, less than 200mg  of dietary cholesterol, 20-35 gm of total fiber daily;Understanding of distribution of calorie intake throughout the day with the consumption of 4-5 meals/snacks    Diabetes  Yes    Intervention  Provide education about signs/symptoms and action to take for hypo/hyperglycemia.;Provide education about proper nutrition, including hydration, and aerobic/resistive exercise prescription along with prescribed medications to achieve blood glucose in normal ranges: Fasting glucose 65-99 mg/dL    Expected Outcomes  Short Term: Participant verbalizes understanding of the signs/symptoms and immediate care of hyper/hypoglycemia, proper foot care and importance of medication, aerobic/resistive exercise and nutrition plan for blood glucose control.;Long Term: Attainment of HbA1C < 7%.    Hypertension  Yes    Intervention  Monitor prescription use  compliance.;Provide education on lifestyle modifcations including regular physical activity/exercise, weight management, moderate sodium restriction and increased consumption of fresh fruit, vegetables, and low fat dairy, alcohol moderation, and smoking cessation.    Expected Outcomes  Long Term: Maintenance of blood pressure at goal levels.;Short Term: Continued assessment and intervention until BP is < 140/47mm HG in hypertensive participants. < 130/43mm HG in hypertensive  participants with diabetes, heart failure or chronic kidney disease.    Lipids  Yes    Intervention  Provide education and support for participant on nutrition & aerobic/resistive exercise along with prescribed medications to achieve LDL 70mg , HDL >40mg .    Expected Outcomes  Short Term: Participant states understanding of desired cholesterol values and is compliant with medications prescribed. Participant is following exercise prescription and nutrition guidelines.;Long Term: Cholesterol controlled with medications as prescribed, with individualized exercise RX and with personalized nutrition plan. Value goals: LDL < 70mg , HDL > 40 mg.       Core Components/Risk Factors/Patient Goals Review:    Core Components/Risk Factors/Patient Goals at Discharge (Final Review):    ITP Comments: ITP Comments    Row Name 12/01/17 1138           ITP Comments  Dr. Fransico Him, Medical Director           Comments:  Patient attended orientation from [phone] to 1032 to review rules and guidelines for program. Completed 6 minute walk test, Intitial ITP, and exercise prescription.  VSS. Telemetry-SR with negative QRS and BBB.   It is difficult for pt to ambulate.  Pt has minimal mobility due to severe deconditioning and repeated orthopedic surgeries.  Pt does not move her left leg due to left knee discomfort.  Pt encouraged to do chair exercise at home to increase strength and endurance.  Pt tolerated the walk test fair. Pt went minimal  distance with use of gait belt, chair and rehab staff.  Pt took 2 seated rest breaks during 151 feet.  Pt is motivated to increase activity and admits she has not done all she could to help herself.  Brief Psychosocial Assessment reveals no immediate barriers to participating in cardiac rehab.  Pt does not drive and depends upon family to get her to her appointments.  Pt recently loss her brother to MI/cardiac arrest. Pt is dealing with this day by day but understands he is is in a better place. Pt is looking forward to participating in cardiac rehab.  Cherre Huger, BSN Cardiac and Training and development officer

## 2017-12-01 NOTE — Progress Notes (Signed)
[name] 67 y.o. female DOB: Apr 12, 1951 MRN: [id number]      Nutrition Note  Dx: s/p CABG x 1 Past Medical History:  Diagnosis Date  . Arthritis    "knees" (02/05/2016)  . Cancer ([place]) 5/10   LUL Vats   . Coronary artery disease   . GERD (gastroesophageal reflux disease)    tx meds  . H/O hiatal hernia   . History of blood transfusion "several"   last april 2015 s/p knee surgery (02/05/2016)  . Hypercholesteremia   . Hypertension   . Lung cancer ([place])    2010, surgery 18% left lung no radiation or chemo  . Non-STEMI (non-ST elevated myocardial infarction) ([place]) 04/07/2012   See cath results below  . Paroxysmal atrial fibrillation ([place]) 04/07/2012   On Warfarin  . Peripheral vascular disease ([place]) 8/12   Lt SFA PTA  . Presence of stent in LAD coronary artery 04/07/2012   Xience Expedition DES 2.75 mm x 18 mm (dilated to 3.0 mm)  . Sleep apnea    does not wear CPAP  . Type II diabetes mellitus ([facility])    insulin dependent   Meds reviewed. Coumadin, Glimepiride, Novolin 70/30 noted  HT: Ht Readings from Last 1 Encounters:  11/23/17 5\' 3"  (1.6 m)    WT: Wt Readings from Last 3 Encounters:  11/23/17 277 lb (125.6 kg)  11/10/17 268 lb 6.4 oz (121.7 kg)  10/31/17 272 lb (123.4 kg)     BMI 49.1   Current tobacco use? No   Labs:  Lipid Panel     Component Value Date/Time   CHOL 133 08/10/2017 1141   TRIG 78 08/10/2017 1141   HDL 46 08/10/2017 1141   CHOLHDL 2.9 08/10/2017 1141   VLDL 16 08/10/2017 1141   LDLCALC 71 08/10/2017 1141   LDLDIRECT 143 (H) 10/10/2008 2042    Lab Results  Component Value Date   HGBA1C 7.9 (H) 08/13/2017   CBG (last 3)  No results for input(s): GLUCAP in the last 72 hours.  Nutrition Note Spoke with pt. Nutrition plan and goals reviewed with pt. Pt is not following the Therapeutic Lifestyle Changes diet at this time. Pt wants to lose wt. Pt has not been actively trying to lose wt. Wt loss tips reviewed. Pt is diabetic. Last A1c  indicates blood glucose not optimally controlled. Pt reports A1c recheck in January 2019 her A1c was 8.4. Pt checks CBG's 3-5 times a day. Per discussion, barriers to blood glucose control include pt drinks regular sodas daily, eats out 2 meals/day, and reports not always taking insulin as prescribed. Pt states she gives herself insulin based on what her fasting CBG is in the morning. Pt with dx of CHF. Pt eats out 2 meals/day every day because "I don't cook." Pt expressed understanding of the information reviewed. Pt aware of nutrition education classes offered.  Nutrition Diagnosis ? Food-and nutrition-related knowledge deficit related to lack of exposure to information as related to diagnosis of: ? CVD ? DM ? Obesity related to excessive energy intake as evidenced by a BMI of 49.1  Nutrition Intervention ? Pt's individual nutrition plan and goals reviewed with pt. ? Pt given handouts for: ? Nutrition I class ? Nutrition II class ? Diabetes Blitz Class  Nutrition Goal(s):  ? Pt to identify and limit food sources of saturated fat, trans fat, and sodium ? Pt to identify food quantities necessary to achieve weight loss of 6-24 lb (2.7-10.9 kg) at graduation from cardiac rehab. ?  Improved blood glucose control as evidenced by pt's A1c trending from 7.9 toward less than 7.0.  Plan:  Pt to attend nutrition classes ? Nutrition I ? Nutrition II ? Portion Distortion ? Diabetes Blitz ? Diabetes Q & A Will provide client-centered nutrition education as part of interdisciplinary care.   Monitor and evaluate progress toward nutrition goal with team.  Derek Mound, M.Ed, RD, LDN, CDE 12/01/2017 11:07 AM

## 2017-12-05 ENCOUNTER — Encounter (HOSPITAL_COMMUNITY): Payer: Medicare HMO

## 2017-12-05 ENCOUNTER — Telehealth (HOSPITAL_COMMUNITY): Payer: Self-pay | Admitting: Internal Medicine

## 2017-12-05 DIAGNOSIS — E1065 Type 1 diabetes mellitus with hyperglycemia: Secondary | ICD-10-CM | POA: Diagnosis not present

## 2017-12-06 ENCOUNTER — Other Ambulatory Visit: Payer: Self-pay | Admitting: *Deleted

## 2017-12-06 ENCOUNTER — Encounter: Payer: Self-pay | Admitting: *Deleted

## 2017-12-06 NOTE — Patient Outreach (Signed)
[facility]) Care Management  [facility] Care Manager  12/06/2017   [name] Feb 20, 1951 [id number]   RN Health Coach Initial Assessment   Referral Date:  11/23/2017 Referral Source:  Transfer from [facility] Reason for Referral:  Continued Disease Management Education Insurance:  [employer]   Outreach Attempt:   Successful telephone outreach to patient for initial telephone assessment.  HIPAA verified with patient.  Patient completed initial telephone assessment.  Social:  Lives at home with son.  Reports being independent with ADLs and dependent on son and sister for IADLs.  States she ambulates with a walker at all times.  Patient reports not driving since surgery but plans to start driving next week.  Currently her brother transports her to her medical appointments.  Denies any falls in the last year.  DME in the home include: rolling walker, Rolator, upper and lower dentures, quad cane, glasses, blood pressure cuff, CBG meter, bedside commode, grab bars in shower, scale, pulse oximeter, and a wheelchair.    Conditions:  Per chart review and discussion with patient, PMH include:  Diabetes, lung cancer, atrial fibrillation, peripheral vascular disease, peripheral angioplasty dyslipidemia, degenerative joint disease, left know osteoarthritis, left knee osteomyelitis, unstable angina, coronary artery disease, coronary artery disease, left total knee revision, congestive heart failure, GERD, renal insufficiency, hypertension, and left upper lobectomy.  Patient with recent Coronary Artery Bypass Grafting in November 2018.  States incisions are completely healed.  Patient stating she is do to start her outpatient Cardiac Rehab on tomorrow, 12/07/2017.  States she monitors her blood pressure about every other day with ranges of 120/60's.  Monitors her weight about 5x a week, with ranges of 270-271.  Did not weight this morning.  Patient encouraged to weigh daily.  Reports  monitoring blood sugars about 4-5 times a day, with fasting ranges less than 150.  Report a couple of hypoglycemic episodes of 70-90's.  Patient does report CBG readings greater than 200's on a daily basis.  Last Hgb A1C 8.4 10/12/2017 and patient stating goal is below 7.  Verbalizes seeing a nutritionist on 12/01/2017 to discuss diet and exercise.  Medications:   Patient reports taking about 11 medications that she manages herself.  Denies any difficulties managing medications.  Encounter Medications:  Outpatient Encounter Medications as of 12/06/2017  Medication Sig Note  . aspirin EC 81 MG tablet Take 81 mg by mouth daily.   . furosemide (LASIX) 40 MG tablet Take 1 tablet (40 mg total) by mouth daily. 11/10/2017: Taking Monday/Wednesday/Friday  . glimepiride (AMARYL) 2 MG tablet Take 2 mg by mouth daily with breakfast.   . insulin NPH-regular Human (NOVOLIN 70/30) (70-30) 100 UNIT/ML injection Inject 30-34 Units into the skin 2 (two) times daily with a meal. Prior to surgery, she took 42 units in the morning 20 units in the evening. As of 08/23/2017, she is on 20 units in am and 20 units in the pm. Her Insulin gradually needs to be increased to pre surgery doses as glucose levels allow. (Patient taking differently: Inject 20 Units into the skin 2 (two) times daily before a meal. ) 09/23/2017: Units endorsed by the patient  . lisinopril-hydrochlorothiazide (PRINZIDE,ZESTORETIC) 20-25 MG per tablet Take 1 tablet by mouth daily.   . metoprolol tartrate (LOPRESSOR) 25 MG tablet Take 1 tablet (25 mg total) by mouth 3 (three) times daily.   . pantoprazole (PROTONIX) 40 MG tablet TAKE 1 TABLET BY MOUTH EVERY DAY (Patient taking differently: Take 40 mg by mouth  once a day)   . potassium chloride (MICRO-K) 10 MEQ CR capsule Take 1 capsule (10 mEq total) by mouth daily. 11/10/2017: Taking Monday/Wednesday/Friday  . simvastatin (ZOCOR) 40 MG tablet Take 40 mg by mouth at bedtime.    . Vitamin D, Cholecalciferol,  1000 units TABS Take 1,000 Units by mouth every morning.    . warfarin (COUMADIN) 7.5 MG tablet Take 7.5 mg by mouth daily. 7.5 mg in the evening on Sun/Tues/Thurs/Sat and 3.75 mg on Mon/Wed/Fri 09/23/2017: Regimen endorsed by the patient (she even verified the color of the tablet)  . warfarin (COUMADIN) 7.5 MG tablet TAKE 1/2 TO 1 TABLET DAILY AS DIRECTED BY COUMADIN CLINIC    No facility-administered encounter medications on file as of 12/06/2017.     Functional Status:  In your present state of health, do you have any difficulty performing the following activities: 12/06/2017 10/03/2017  Hearing? N N  Vision? N N  Difficulty concentrating or making decisions? N N  Walking or climbing stairs? Y Y  Dressing or bathing? N N  Doing errands, shopping? Y Y  Comment - Family helps  Preparing Food and eating ? Y Y  Using the Toilet? N N  In the past six months, have you accidently leaked urine? Y N  Do you have problems with loss of bowel control? N N  Managing your Medications? N N  Managing your Finances? N N  Housekeeping or managing your Housekeeping? Y Y  Some recent data might be hidden    Fall/Depression Screening: Fall Risk  12/06/2017 10/03/2017 08/26/2017  Falls in the past year? No No Yes  Number falls in past yr: - - 1  Injury with Fall? - - No  Risk for fall due to : - - History of fall(s);Impaired balance/gait;Impaired mobility  Follow up - - Education provided;Falls prevention discussed   PHQ 2/9 Scores 12/06/2017 10/03/2017 08/26/2017 02/26/2014 01/17/2014 12/17/2013 11/21/2013  PHQ - 2 Score 0 0 0 0 0 0 0    THN CM Care Plan Problem One     Most Recent Value  Care Plan Problem One  Knowledge deficiet related to self care management of diabetes  Role Documenting the Problem One  Health Coach  Care Plan for Problem One  Active  THN Long Term Goal   Patient will verbalize reduction in Hgb A1C by 0.5 points in the next 90 days.  THN Long Term Goal Start Date  12/06/17   Interventions for Problem One Long Term Goal  Current care plan reviewed and discussed with patient, patient encouraged to keep and attend medical appointments, medication compliance encouraged, encouraged patient to review Living Well with Diabetes Booklet, encouraged patient to document CBG readings, encouraged patient to utilize 2019 Calendar booklet to document daily weights, CBG readings, pulse readings, and blood pressure readings,  encouraged patient to participate in Outpatient Cardiac Rehab for stregnthening and weight loss, discussed healthier meal options for patient  THN CM Short Term Goal #1   Patient will verbalize a weight loss of 2 pounds in the next 30 days.  THN CM Short Term Goal #1 Start Date  12/06/17  Interventions for Short Term Goal #1  Patient encouraged to weigh self daily, encouraged participation in Outpatient Cardiac Rehab, discussed healtier meal options, encouraged patient to cook more than eating out for meals, encouraged patient to reduce the use of sodas  THN CM Short Term Goal #2   Patient will report eating less fried foods in the next 30 days.  THN CM Short Term Goal #2 Start Date  12/06/17  Interventions for Short Term Goal #2  Reviewed heart healthy low sodium diabetic diet with patient, discussed healthier meal options with patient, encouraged patient to review material from recent visit to nutritionist, encouraged patient to review Living Well with Diabetes and Living Better with Heart Failure booklets      Appointments:  States last seen Dr. Nicholos Johns on 10/19/2017.  Has scheduled follow up appointments 01/18/2018 for lab work and follow up appointment the following week May 2019.  Has scheduled appointment with Dr. Royann Shivers, Cardiologist on 01/09/2018.  Patient stating she see her eye doctor on 01/09/2018 also.  Advanced Directives:  Denies having an Advance Directive in place and does not wish to create one at this time.   Consent:  [facility] services reviewed and  discussed with patient.  Patient verbally agrees to monthly telephone outreaches.  Plan: Patient will utilize 2019 Calendar Booklet to record blood pressure, blood sugar, pulse, and daily weight readings. Patient will verify what educational material she already has, i.e. Diabetes booklet, heart failure booklet. RN Health Coach will send primary MD barriers letter. RN Health Coach will route initial assessment note to primary care provider. RN Health Coach will make next monthly outreach to patient in the month of April.  Rhae Lerner RN [facility] Care Management  RN Health Coach [phone] [email or web address]

## 2017-12-07 ENCOUNTER — Encounter (HOSPITAL_COMMUNITY)
Admission: RE | Admit: 2017-12-07 | Discharge: 2017-12-07 | Disposition: A | Discharge status: Home / Self Care | Payer: Medicare HMO | Source: Ambulatory Visit | Attending: Cardiovascular Disease | Admitting: Cardiovascular Disease

## 2017-12-07 DIAGNOSIS — Z951 Presence of aortocoronary bypass graft: Secondary | ICD-10-CM | POA: Diagnosis not present

## 2017-12-07 DIAGNOSIS — Z48812 Encounter for surgical aftercare following surgery on the circulatory system: Secondary | ICD-10-CM | POA: Diagnosis not present

## 2017-12-07 LAB — GLUCOSE, CAPILLARY
GLUCOSE-CAPILLARY: 118 mg/dL — AB (ref 65–99)
Glucose-Capillary: 136 mg/dL — ABNORMAL HIGH (ref 65–99)

## 2017-12-07 NOTE — Progress Notes (Signed)
Daily Session Note  Patient Details  Name: Ivorie A Weyland MRN: [id number] Date of Birth: 05/25/1951 Referring Provider:     CARDIAC REHAB PHASE II ORIENTATION from 12/01/2017 in [facility]  Referring Provider  Croitoru, Mihai MD      Encounter Date: 12/07/2017  Check In: Session Check In - 12/07/17 1156      Check-In   Location  [facility]    Staff Present  Amber Fair, MS, ACSM RCEP, Exercise Physiologist;Joann Rion, RN, BSN;Maria Whitaker, RN, BSN;Tyara Nevels, MS,ACSM CEP, Exercise Physiologist    Supervising physician immediately available to respond to emergencies  [facility] immediately available    Physician(s)  Dr. Vega     Medication changes reported      No    Fall or balance concerns reported     No    Tobacco Cessation  No Change    Warm-up and Cool-down  Performed as group-led instruction    Resistance Training Performed  No    VAD Patient?  No      Pain Assessment   Currently in Pain?  No/denies       Capillary Blood Glucose: Results for orders placed or performed during the hospital encounter of 12/07/17 (from the past 24 hour(s))  Glucose, capillary     Status: Abnormal   Collection Time: 12/07/17 11:20 AM  Result Value Ref Range   Glucose-Capillary 118 (H) 65 - 99 mg/dL      Social History   Tobacco Use  Smoking Status Former Smoker  . Packs/day: 1.00  . Years: 20.00  . Pack years: 20.00  . Types: Cigarettes  . Last attempt to quit: 09/27/1984  . Years since quitting: 33.2  Smokeless Tobacco Never Used    Goals Met:  No report of cardiac concerns or symptoms  Goals Unmet:  Not Applicable  Comments: [place] started cardiac rehab today.  Pt tolerated light exercise . Neva is extremely deconditioned and has poor posture. Patient uses a rolling walker for stability . VSS, telemetry-Sinus Rhtyhm, asymptomatic.  Medication list reconciled. Pt denies barriers to medicaiton compliance.   PSYCHOSOCIAL ASSESSMENT:  PHQ-0. Pt exhibits positive coping skills, hopeful outlook with supportive family. No psychosocial needs identified at this time, no psychosocial interventions necessary.    Pt enjoys going to church.   Pt oriented to exercise equipment and routine.    Understanding verbalized. Lulamae is only able to walk short distances as she does not do a lot of walking at home. Ambar will use the nustep only as Casi has problems with her knees.Will continue to monitor the patient throughout  the program.Maria Whitaker, RN,BSN 12/07/2017 5:12 PM    Dr. Traci Turner is Medical Director for Cardiac Rehab at [facility]. 

## 2017-12-09 ENCOUNTER — Encounter (HOSPITAL_COMMUNITY)
Admission: RE | Admit: 2017-12-09 | Discharge: 2017-12-09 | Disposition: A | Discharge status: Home / Self Care | Payer: Medicare HMO | Source: Ambulatory Visit | Attending: Cardiovascular Disease | Admitting: Cardiovascular Disease

## 2017-12-09 DIAGNOSIS — Z951 Presence of aortocoronary bypass graft: Secondary | ICD-10-CM | POA: Diagnosis not present

## 2017-12-09 DIAGNOSIS — Z48812 Encounter for surgical aftercare following surgery on the circulatory system: Secondary | ICD-10-CM | POA: Diagnosis not present

## 2017-12-09 LAB — GLUCOSE, CAPILLARY
GLUCOSE-CAPILLARY: 160 mg/dL — AB (ref 65–99)
GLUCOSE-CAPILLARY: 214 mg/dL — AB (ref 65–99)

## 2017-12-12 ENCOUNTER — Ambulatory Visit (INDEPENDENT_AMBULATORY_CARE_PROVIDER_SITE_OTHER): Payer: Medicare HMO | Admitting: Pharmacist

## 2017-12-12 ENCOUNTER — Encounter (HOSPITAL_COMMUNITY)
Admission: RE | Admit: 2017-12-12 | Discharge: 2017-12-12 | Disposition: A | Discharge status: Home / Self Care | Payer: Medicare HMO | Source: Ambulatory Visit | Attending: Cardiovascular Disease | Admitting: Cardiovascular Disease

## 2017-12-12 DIAGNOSIS — Z48812 Encounter for surgical aftercare following surgery on the circulatory system: Secondary | ICD-10-CM | POA: Diagnosis not present

## 2017-12-12 DIAGNOSIS — I480 Paroxysmal atrial fibrillation: Secondary | ICD-10-CM | POA: Diagnosis not present

## 2017-12-12 DIAGNOSIS — Z951 Presence of aortocoronary bypass graft: Secondary | ICD-10-CM | POA: Diagnosis not present

## 2017-12-12 DIAGNOSIS — Z7901 Long term (current) use of anticoagulants: Secondary | ICD-10-CM | POA: Diagnosis not present

## 2017-12-12 LAB — GLUCOSE, CAPILLARY
GLUCOSE-CAPILLARY: 147 mg/dL — AB (ref 65–99)
GLUCOSE-CAPILLARY: 142 mg/dL — AB (ref 65–99)

## 2017-12-12 LAB — POCT INR: INR: 1.9

## 2017-12-13 DIAGNOSIS — E1065 Type 1 diabetes mellitus with hyperglycemia: Secondary | ICD-10-CM | POA: Diagnosis not present

## 2017-12-14 ENCOUNTER — Encounter (HOSPITAL_COMMUNITY)
Admission: RE | Admit: 2017-12-14 | Discharge: 2017-12-14 | Disposition: A | Discharge status: Home / Self Care | Payer: Medicare HMO | Source: Ambulatory Visit | Attending: Cardiovascular Disease | Admitting: Cardiovascular Disease

## 2017-12-14 DIAGNOSIS — Z951 Presence of aortocoronary bypass graft: Secondary | ICD-10-CM

## 2017-12-14 DIAGNOSIS — Z48812 Encounter for surgical aftercare following surgery on the circulatory system: Secondary | ICD-10-CM | POA: Diagnosis not present

## 2017-12-14 LAB — GLUCOSE, CAPILLARY
GLUCOSE-CAPILLARY: 181 mg/dL — AB (ref 65–99)
GLUCOSE-CAPILLARY: 198 mg/dL — AB (ref 65–99)

## 2017-12-15 NOTE — Progress Notes (Signed)
Cardiac Individual Treatment Plan  Patient Details  Name: [name] MRN: [id number] Date of Birth: 05/26/1951 Referring Provider:     CARDIAC REHAB PHASE II ORIENTATION from 12/01/2017 in [facility]  Referring Provider  Croitoru, Dani Gobble MD      Initial Encounter Date:    [facility] from 12/01/2017 in [facility]  Date  12/01/17  Referring Provider  Croitoru, Mihai MD      Visit Diagnosis: S/P CABG x 1  Patient's Home Medications on Admission:  Current Outpatient Medications:  .  aspirin EC 81 MG tablet, Take 81 mg by mouth daily., Disp: , Rfl:  .  furosemide (LASIX) 40 MG tablet, Take 1 tablet (40 mg total) by mouth daily., Disp: 90 tablet, Rfl: 3 .  glimepiride (AMARYL) 2 MG tablet, Take 2 mg by mouth daily with breakfast., Disp: , Rfl:  .  insulin NPH-regular Human (NOVOLIN 70/30) (70-30) 100 UNIT/ML injection, Inject 30-34 Units into the skin 2 (two) times daily with a meal. Prior to surgery, she took 42 units in the morning 20 units in the evening. As of 08/23/2017, she is on 20 units in am and 20 units in the pm. Her Insulin gradually needs to be increased to pre surgery doses as glucose levels allow. (Patient taking differently: Inject 20 Units into the skin 2 (two) times daily before a meal. ), Disp: 10 mL, Rfl: 11 .  lisinopril-hydrochlorothiazide (PRINZIDE,ZESTORETIC) 20-25 MG per tablet, Take 1 tablet by mouth daily., Disp: , Rfl:  .  metoprolol tartrate (LOPRESSOR) 25 MG tablet, Take 1 tablet (25 mg total) by mouth 3 (three) times daily., Disp: 60 tablet, Rfl: 11 .  pantoprazole (PROTONIX) 40 MG tablet, TAKE 1 TABLET BY MOUTH EVERY DAY (Patient taking differently: Take 40 mg by mouth once a day), Disp: 90 tablet, Rfl: 2 .  potassium chloride (MICRO-K) 10 MEQ CR capsule, Take 1 capsule (10 mEq total) by mouth daily., Disp: 30 capsule, Rfl: 6 .  simvastatin (ZOCOR) 40 MG tablet, Take 40 mg  by mouth at bedtime. , Disp: , Rfl:  .  Vitamin D, Cholecalciferol, 1000 units TABS, Take 1,000 Units by mouth every morning. , Disp: , Rfl:  .  warfarin (COUMADIN) 7.5 MG tablet, Take 7.5 mg by mouth daily. 7.5 mg in the evening on Sun/Tues/Thurs/Sat and 3.75 mg on Mon/Wed/Fri, Disp: , Rfl:  .  warfarin (COUMADIN) 7.5 MG tablet, TAKE 1/2 TO 1 TABLET DAILY AS DIRECTED BY COUMADIN CLINIC, Disp: 90 tablet, Rfl: 0  Past Medical History: Past Medical History:  Diagnosis Date  . Arthritis    "knees" (02/05/2016)  . Cancer ([place]) 5/10   L[facility]   . Coronary artery disease   . GERD (gastroesophageal reflux disease)    tx meds  . H/O hiatal hernia   . History of blood transfusion "several"   last april 2015 s/p knee surgery (02/05/2016)  . Hypercholesteremia   . Hypertension   . Lung cancer ([place])    2010, surgery 18% left lung no radiation or chemo  . Non-STEMI (non-ST elevated myocardial infarction) ([place]) 04/07/2012   See cath results below  . Paroxysmal atrial fibrillation ([place]) 04/07/2012   On Warfarin  . Peripheral vascular disease ([place]) 8/12   Lt SFA PTA  . Presence of stent in LAD coronary artery 04/07/2012   Xience Expedition DES 2.75 mm x 18 mm (dilated to 3.0 mm)  . Sleep apnea  does not wear CPAP  . Type II diabetes mellitus ([facility])    insulin dependent    Tobacco Use: Social History   Tobacco Use  Smoking Status Former Smoker  . Packs/day: 1.00  . Years: 20.00  . Pack years: 20.00  . Types: Cigarettes  . Last attempt to quit: 09/27/1984  . Years since quitting: 33.2  Smokeless Tobacco Never Used    Labs: Recent Review Flowsheet Data    Labs for ITP Cardiac and Pulmonary Rehab Latest Ref Rng & Units 08/17/2017 08/17/2017 08/17/2017 08/17/2017 08/17/2017   Cholestrol 0 - 200 mg/dL - - - - -   LDLCALC 0 - 99 mg/dL - - - - -   LDLDIRECT mg/dL - - - - -   HDL >40 mg/dL - - - - -   Trlycerides <150 mg/dL - - - - -   Hemoglobin A1c 4.8 - 5.6 % - - - - -   PHART 7.350 -  7.450 7.520(H) 7.426 7.422 7.363 -   PCO2ART 32.0 - 48.0 mmHg 32.9 35.6 34.8 41.2 -   HCO3 20.0 - 28.0 mmol/L 26.9 24.0 22.9 23.7 -   TCO2 22 - 32 mmol/L 28 25 24 25 24    ACIDBASEDEF 0.0 - 2.0 mmol/L - 1.0 2.0 2.0 -   O2SAT % 100.0 99.0 100.0 100.0 -      Capillary Blood Glucose: Lab Results  Component Value Date   GLUCAP 198 (H) 12/14/2017   GLUCAP 181 (H) 12/14/2017   GLUCAP 142 (H) 12/12/2017   GLUCAP 147 (H) 12/12/2017   GLUCAP 214 (H) 12/09/2017     Exercise Target Goals:    Exercise Program Goal: Individual exercise prescription set using results from initial 6 min walk test and THRR while considering  patient's activity barriers and safety.   Exercise Prescription Goal: Initial exercise prescription builds to 30-45 minutes a day of aerobic activity, 2-3 days per week.  Home exercise guidelines will be given to patient during program as part of exercise prescription that the participant will acknowledge.  Activity Barriers & Risk Stratification: Activity Barriers & Cardiac Risk Stratification - 12/01/17 1149      Activity Barriers & Cardiac Risk Stratification   Activity Barriers  Arthritis;Joint Problems;Muscular Weakness;Deconditioning;Left Knee Replacement;Other (comment)    Comments  Severe Knee Pain and Severe Deconditioning     Cardiac Risk Stratification  High       6 Minute Walk: 6 Minute Walk    Row Name 12/01/17 1141         6 Minute Walk   Phase  Initial     Distance  151 feet     Walk Time  2.25 minutes     # of Rest Breaks  1     MPH  0.29     METS  -0.27     RPE  15     Perceived Dyspnea   3     VO2 Peak  -0.94     Symptoms  Yes (comment)     Comments  WheelChair and Gait Belt was used to assist pt with walk. Pt walked for a total of 2 mins and 25 secs. Pt took 1 rest break for 35 seconds. Pt rated dyspnea 3/4 and had fatigued.      Resting HR  68 bpm     Resting BP  108/62     Resting Oxygen Saturation   99 %     Exercise Oxygen  Saturation  during 6 min  walk  100 %     Max Ex. HR  96 bpm     Max Ex. BP  118/64     2 Minute Post BP  116/58        Oxygen Initial Assessment:   Oxygen Re-Evaluation:   Oxygen Discharge (Final Oxygen Re-Evaluation):   Initial Exercise Prescription: Initial Exercise Prescription - 12/01/17 1100      Date of Initial Exercise RX and Referring Provider   Date  12/01/17    Referring Provider  Croitoru, Mihai MD      NuStep   Level  1    SPM  70    Minutes  30    METs  1      Prescription Details   Frequency (times per week)  3x    Duration  Progress to 30 minutes of continuous aerobic without signs/symptoms of physical distress      Intensity   THRR 40-80% of Max Heartrate  61-122    Ratings of Perceived Exertion  11-13    Perceived Dyspnea  0-4      Progression   Progression  Continue progressive overload as per policy without signs/symptoms or physical distress.      Resistance Training   Training Prescription  Yes    Weight  2lbs    Reps  10-15       Perform Capillary Blood Glucose checks as needed.  Exercise Prescription Changes: Exercise Prescription Changes    Row Name 12/07/17 1440 12/14/17 1600           Response to Exercise   Blood Pressure (Admit)  114/80  118/62      Blood Pressure (Exercise)  122/70  122/84      Blood Pressure (Exit)  122/60  120/68      Heart Rate (Admit)  74 bpm  94 bpm      Heart Rate (Exercise)  87 bpm  109 bpm      Heart Rate (Exit)  74 bpm  90 bpm      Rating of Perceived Exertion (Exercise)  13  11      Perceived Dyspnea (Exercise)  0  0      Symptoms  None  -      Comments  Pt oriented to exercise equipment   -      Duration  Progress to 30 minutes of  aerobic without signs/symptoms of physical distress  Continue with 30 min of aerobic exercise without signs/symptoms of physical distress.      Intensity  THRR New  THRR unchanged        Progression   Progression  Continue to progress workloads to maintain  intensity without signs/symptoms of physical distress.  Continue to progress workloads to maintain intensity without signs/symptoms of physical distress.      Average METs  1.8  1.5        Resistance Training   Training Prescription  No  Yes      Weight  -  2lbs      Reps  -  10-15      Time  -  10 Minutes        Interval Training   Interval Training  No  No        NuStep   Level  1  1      SPM  70  75      Minutes  30  30      METs  1.8  1.5         Exercise Comments: Exercise Comments    Row Name 12/07/17 1442 12/14/17 1639         Exercise Comments  Pt off to a good start with exercise. Pt was oriented to exercise equipment.   Pt is tolerating exercise workloads depsite severe limitations due to knee problems. Will continue to work with pt on improving functional fitness and improving stamina.         Exercise Goals and Review: Exercise Goals    Row Name 12/01/17 1155             Exercise Goals   Increase Physical Activity  Yes       Intervention  Provide advice, education, support and counseling about physical activity/exercise needs.;Develop an individualized exercise prescription for aerobic and resistive training based on initial evaluation findings, risk stratification, comorbidities and participant's personal goals.       Expected Outcomes  Short Term: Attend rehab on a regular basis to increase amount of physical activity.;Long Term: Add in home exercise to make exercise part of routine and to increase amount of physical activity.;Long Term: Exercising regularly at least 3-5 days a week.       Increase Strength and Stamina  Yes       Intervention  Provide advice, education, support and counseling about physical activity/exercise needs.;Develop an individualized exercise prescription for aerobic and resistive training based on initial evaluation findings, risk stratification, comorbidities and participant's personal goals.       Expected Outcomes  Short Term:  Increase workloads from initial exercise prescription for resistance, speed, and METs.;Short Term: Perform resistance training exercises routinely during rehab and add in resistance training at home;Long Term: Improve cardiorespiratory fitness, muscular endurance and strength as measured by increased METs and functional capacity (6MWT)       Able to understand and use rate of perceived exertion (RPE) scale  Yes       Intervention  Provide education and explanation on how to use RPE scale       Expected Outcomes  Short Term: Able to use RPE daily in rehab to express subjective intensity level;Long Term:  Able to use RPE to guide intensity level when exercising independently       Able to understand and use Dyspnea scale  Yes       Intervention  Provide education and explanation on how to use Dyspnea scale       Expected Outcomes  Short Term: Able to use Dyspnea scale daily in rehab to express subjective sense of shortness of breath during exertion;Long Term: Able to use Dyspnea scale to guide intensity level when exercising independently       Knowledge and understanding of Target Heart Rate Range (THRR)  Yes       Intervention  Provide education and explanation of THRR including how the numbers were predicted and where they are located for reference       Expected Outcomes  Short Term: Able to state/look up THRR;Short Term: Able to use daily as guideline for intensity in rehab;Long Term: Able to use THRR to govern intensity when exercising independently       Able to check pulse independently  Yes       Intervention  Provide education and demonstration on how to check pulse in carotid and radial arteries.;Review the importance of being able to check your own pulse for safety during independent exercise       Expected Outcomes  Short Term: Able to explain why pulse checking is important during independent exercise;Long Term: Able to check pulse independently and accurately       Understanding of Exercise  Prescription  Yes       Intervention  Provide education, explanation, and written materials on patient's individual exercise prescription       Expected Outcomes  Short Term: Able to explain program exercise prescription;Long Term: Able to explain home exercise prescription to exercise independently          Exercise Goals Re-Evaluation : Exercise Goals Re-Evaluation    Row Name 12/14/17 1644             Exercise Goal Re-Evaluation   Exercise Goals Review  Increase Physical Activity;Able to understand and use rate of perceived exertion (RPE) scale;Understanding of Exercise Prescription;Knowledge and understanding of Target Heart Rate Range (THRR);Increase Strength and Stamina;Able to check pulse independently       Comments  Pt is severely limited by chronic knee pain and deconditioned. However, pt is consistenly coming to exercise at rehab and puts forth great effort. Will continue to work with pt to improve strength and stamina. Pt is tolerating workloads well and is able to exercise on Nustep for 30 minutes with minimal difficullty.        Expected Outcomes  Will follow up with pt about plans for exercise at home. Will suggest to pt chair exercises.  Pt will continue to increase cardiorespiratory fitness. Will continue to monitor and progress pt as tolerated.            Discharge Exercise Prescription (Final Exercise Prescription Changes): Exercise Prescription Changes - 12/14/17 1600      Response to Exercise   Blood Pressure (Admit)  118/62    Blood Pressure (Exercise)  122/84    Blood Pressure (Exit)  120/68    Heart Rate (Admit)  94 bpm    Heart Rate (Exercise)  109 bpm    Heart Rate (Exit)  90 bpm    Rating of Perceived Exertion (Exercise)  11    Perceived Dyspnea (Exercise)  0    Duration  Continue with 30 min of aerobic exercise without signs/symptoms of physical distress.    Intensity  THRR unchanged      Progression   Progression  Continue to progress workloads to  maintain intensity without signs/symptoms of physical distress.    Average METs  1.5      Resistance Training   Training Prescription  Yes    Weight  2lbs    Reps  10-15    Time  10 Minutes      Interval Training   Interval Training  No      NuStep   Level  1    SPM  75    Minutes  30    METs  1.5       Nutrition:  Target Goals: Understanding of nutrition guidelines, daily intake of sodium 1500mg , cholesterol 200mg , calories 30% from fat and 7% or less from saturated fats, daily to have 5 or more servings of fruits and vegetables.  Biometrics: Pre Biometrics - 12/01/17 1150      Pre Biometrics   Height  5\' 3"  (1.6 m)    Weight  272 lb 14.9 oz (123.8 kg)    Waist Circumference  48.5 inches    Hip Circumference  59 inches    Waist to Hip Ratio  0.82 %    BMI (Calculated)  48.36  Triceps Skinfold  25 mm    % Body Fat  54.8 %    Grip Strength  24 kg    Flexibility  0 in    Single Leg Stand  0 seconds        Nutrition Therapy Plan and Nutrition Goals: Nutrition Therapy & Goals - 12/01/17 1346      Nutrition Therapy   Diet  Carb Modified, Heart Healthy    Drug/Food Interactions  Coumadin/Vit K      Personal Nutrition Goals   Nutrition Goal  Pt to identify and limit food sources of saturated fat, trans fat, and sodium    Personal Goal #2  Pt to identify food quantities necessary to achieve weight loss of 6-24 lb (2.7-10.9 kg) at graduation from cardiac rehab.    Personal Goal #3  Improved blood glucose control as evidenced by pt's A1c trending from 7.9 toward less than 7.0.      Intervention Plan   Intervention  Prescribe, educate and counsel regarding individualized specific dietary modifications aiming towards targeted core components such as weight, hypertension, lipid management, diabetes, heart failure and other comorbidities.    Expected Outcomes  Short Term Goal: Understand basic principles of dietary content, such as calories, fat, sodium, cholesterol and  nutrients.;Long Term Goal: Adherence to prescribed nutrition plan.       Nutrition Assessments: Nutrition Assessments - 12/01/17 1346      MEDFICTS Scores   Pre Score  93       Nutrition Goals Re-Evaluation:   Nutrition Goals Re-Evaluation:   Nutrition Goals Discharge (Final Nutrition Goals Re-Evaluation):   Psychosocial: Target Goals: Acknowledge presence or absence of significant depression and/or stress, maximize coping skills, provide positive support system. Participant is able to verbalize types and ability to use techniques and skills needed for reducing stress and depression.  Initial Review & Psychosocial Screening: Initial Psych Review & Screening - 12/01/17 1308      Initial Review   Current issues with  None Identified      Family Dynamics   Good Support System?  Yes    Comments  Recently loss of brother - MI      Barriers   Psychosocial barriers to participate in program  The patient should benefit from training in stress management and relaxation.      Screening Interventions   Interventions  Encouraged to exercise    Expected Outcomes  Short Term goal: Identification and review with participant of any Quality of Life or Depression concerns found by scoring the questionnaire.;Long Term goal: The participant improves quality of Life and PHQ9 Scores as seen by post scores and/or verbalization of changes       Quality of Life Scores: Quality of Life - 12/01/17 1151      Quality of Life Scores   Health/Function Pre  25.2 %    Socioeconomic Pre  29.29 %    Psych/Spiritual Pre  30 %    Family Pre  30 %    GLOBAL Pre  27.67 %      Scores of 19 and below usually indicate a poorer quality of life in these areas.  A difference of  2-3 points is a clinically meaningful difference.  A difference of 2-3 points in the total score of the Quality of Life Index has been associated with significant improvement in overall quality of life, self-image, physical  symptoms, and general health in studies assessing change in quality of life.  PHQ-9: Recent Review Flowsheet Data  Depression screen [facility] 2/9 12/06/2017 10/03/2017 08/26/2017 02/26/2014 01/17/2014   Decreased Interest 0 0 0 0 0   Down, Depressed, Hopeless 0 0 0 0 0   PHQ - 2 Score 0 0 0 0 0     Interpretation of Total Score  Total Score Depression Severity:  1-4 = Minimal depression, 5-9 = Mild depression, 10-14 = Moderate depression, 15-19 = Moderately severe depression, 20-27 = Severe depression   Psychosocial Evaluation and Intervention:   Psychosocial Re-Evaluation: Psychosocial Re-Evaluation    [place] Name 12/15/17 1330             Psychosocial Re-Evaluation   Current issues with  None Identified       Interventions  Encouraged to attend Cardiac Rehabilitation for the exercise;Stress management education       Continue Psychosocial Services   No Follow up required          Psychosocial Discharge (Final Psychosocial Re-Evaluation): Psychosocial Re-Evaluation - 12/15/17 1330      Psychosocial Re-Evaluation   Current issues with  None Identified    Interventions  Encouraged to attend Cardiac Rehabilitation for the exercise;Stress management education    Continue Psychosocial Services   No Follow up required       Vocational Rehabilitation: Provide vocational rehab assistance to qualifying candidates.   Vocational Rehab Evaluation & Intervention: Vocational Rehab - 12/01/17 1311      Initial Vocational Rehab Evaluation & Intervention   Assessment shows need for Vocational Rehabilitation  No Pt is retired       Education: Education Goals: Education classes will be provided on a weekly basis, covering required topics. Participant will state understanding/return demonstration of topics presented.  Learning Barriers/Preferences: Learning Barriers/Preferences - 12/01/17 1150      Learning Barriers/Preferences   Learning Barriers  Sight    Learning Preferences  Verbal  Instruction;Video;Written Material;Skilled Demonstration       Education Topics: Count Your Pulse:  -Group instruction provided by verbal instruction, demonstration, patient participation and written materials to support subject.  Instructors address importance of being able to find your pulse and how to count your pulse when at home without a heart monitor.  Patients get hands on experience counting their pulse with staff help and individually.   Heart Attack, Angina, and Risk Factor Modification:  -Group instruction provided by verbal instruction, video, and written materials to support subject.  Instructors address signs and symptoms of angina and heart attacks.    Also discuss risk factors for heart disease and how to make changes to improve heart health risk factors.   Functional Fitness:  -Group instruction provided by verbal instruction, demonstration, patient participation, and written materials to support subject.  Instructors address safety measures for doing things around the house.  Discuss how to get up and down off the floor, how to pick things up properly, how to safely get out of a chair without assistance, and balance training.   Meditation and Mindfulness:  -Group instruction provided by verbal instruction, patient participation, and written materials to support subject.  Instructor addresses importance of mindfulness and meditation practice to help reduce stress and improve awareness.  Instructor also leads participants through a meditation exercise.    CARDIAC REHAB PHASE II EXERCISE from 12/14/2017 in [facility]  Date  12/14/17  Educator  Jeanella Craze  Instruction Review Code  2- Demonstrated Understanding      Stretching for Flexibility and Mobility:  -Group instruction provided by verbal instruction, patient participation,  and written materials to support subject.  Instructors lead participants through series of stretches that are  designed to increase flexibility thus improving mobility.  These stretches are additional exercise for major muscle groups that are typically performed during regular warm up and cool down.   Hands Only CPR:  -Group verbal, video, and participation provides a basic overview of AHA guidelines for community CPR. Role-play of emergencies allow participants the opportunity to practice calling for help and chest compression technique with discussion of AED use.   Hypertension: -Group verbal and written instruction that provides a basic overview of hypertension including the most recent diagnostic guidelines, risk factor reduction with self-care instructions and medication management.    Nutrition I class: Heart Healthy Eating:  -Group instruction provided by PowerPoint slides, verbal discussion, and written materials to support subject matter. The instructor gives an explanation and review of the Therapeutic Lifestyle Changes diet recommendations, which includes a discussion on lipid goals, dietary fat, sodium, fiber, plant stanol/sterol esters, sugar, and the components of a well-balanced, healthy diet.   Nutrition II class: Lifestyle Skills:  -Group instruction provided by PowerPoint slides, verbal discussion, and written materials to support subject matter. The instructor gives an explanation and review of label reading, grocery shopping for heart health, heart healthy recipe modifications, and ways to make healthier choices when eating out.   Diabetes Question & Answer:  -Group instruction provided by PowerPoint slides, verbal discussion, and written materials to support subject matter. The instructor gives an explanation and review of diabetes co-morbidities, pre- and post-prandial blood glucose goals, pre-exercise blood glucose goals, signs, symptoms, and treatment of hypoglycemia and hyperglycemia, and foot care basics.   Diabetes Blitz:  -Group instruction provided by PowerPoint slides,  verbal discussion, and written materials to support subject matter. The instructor gives an explanation and review of the physiology behind type 1 and type 2 diabetes, diabetes medications and rational behind using different medications, pre- and post-prandial blood glucose recommendations and Hemoglobin A1c goals, diabetes diet, and exercise including blood glucose guidelines for exercising safely.    Portion Distortion:  -Group instruction provided by PowerPoint slides, verbal discussion, written materials, and food models to support subject matter. The instructor gives an explanation of serving size versus portion size, changes in portions sizes over the last 20 years, and what consists of a serving from each food group.   Stress Management:  -Group instruction provided by verbal instruction, video, and written materials to support subject matter.  Instructors review role of stress in heart disease and how to cope with stress positively.     Exercising on Your Own:  -Group instruction provided by verbal instruction, power point, and written materials to support subject.  Instructors discuss benefits of exercise, components of exercise, frequency and intensity of exercise, and end points for exercise.  Also discuss use of nitroglycerin and activating EMS.  Review options of places to exercise outside of rehab.  Review guidelines for sex with heart disease.   Cardiac Drugs I:  -Group instruction provided by verbal instruction and written materials to support subject.  Instructor reviews cardiac drug classes: antiplatelets, anticoagulants, beta blockers, and statins.  Instructor discusses reasons, side effects, and lifestyle considerations for each drug class.   Cardiac Drugs II:  -Group instruction provided by verbal instruction and written materials to support subject.  Instructor reviews cardiac drug classes: angiotensin converting enzyme inhibitors (ACE-I), angiotensin II receptor blockers  (ARBs), nitrates, and calcium channel blockers.  Instructor discusses reasons, side effects, and lifestyle considerations  for each drug class.   Anatomy and Physiology of the Circulatory System:  Group verbal and written instruction and models provide basic cardiac anatomy and physiology, with the coronary electrical and arterial systems. Review of: AMI, Angina, Valve disease, Heart Failure, Peripheral Artery Disease, Cardiac Arrhythmia, Pacemakers, and the ICD.   Other Education:  -Group or individual verbal, written, or video instructions that support the educational goals of the cardiac rehab program.   CARDIAC REHAB PHASE II EXERCISE from 12/14/2017 in [facility]  Date  12/07/17  Educator  CVA video  Instruction Review Code  2- Demonstrated Understanding      Holiday Eating Survival Tips:  -Group instruction provided by PowerPoint slides, verbal discussion, and written materials to support subject matter. The instructor gives patients tips, tricks, and techniques to help them not only survive but enjoy the holidays despite the onslaught of food that accompanies the holidays.   Knowledge Questionnaire Score: Knowledge Questionnaire Score - 12/01/17 1151      Knowledge Questionnaire Score   Pre Score  22/24       Core Components/Risk Factors/Patient Goals at Admission: Personal Goals and Risk Factors at Admission - 12/01/17 1138      Core Components/Risk Factors/Patient Goals on Admission    Weight Management  Yes;Obesity    Intervention  Weight Management: Develop a combined nutrition and exercise program designed to reach desired caloric intake, while maintaining appropriate intake of nutrient and fiber, sodium and fats, and appropriate energy expenditure required for the weight goal.;Weight Management: Provide education and appropriate resources to help participant work on and attain dietary goals.;Weight Management/Obesity: Establish reasonable  short term and long term weight goals.;Obesity: Provide education and appropriate resources to help participant work on and attain dietary goals.    Admit Weight  272 lb 14.9 oz (123.8 kg)    Goal Weight: Short Term  265 lb (120.2 kg)    Goal Weight: Long Term  257 lb (116.6 kg)    Expected Outcomes  Short Term: Continue to assess and modify interventions until short term weight is achieved;Long Term: Adherence to nutrition and physical activity/exercise program aimed toward attainment of established weight goal;Weight Loss: Understanding of general recommendations for a balanced deficit meal plan, which promotes 1-2 lb weight loss per week and includes a negative energy balance of [id number] kcal/d;Understanding recommendations for meals to include 15-35% energy as protein, 25-35% energy from fat, 35-60% energy from carbohydrates, less than 200mg  of dietary cholesterol, 20-35 gm of total fiber daily;Understanding of distribution of calorie intake throughout the day with the consumption of 4-5 meals/snacks    Diabetes  Yes    Intervention  Provide education about signs/symptoms and action to take for hypo/hyperglycemia.;Provide education about proper nutrition, including hydration, and aerobic/resistive exercise prescription along with prescribed medications to achieve blood glucose in normal ranges: Fasting glucose 65-99 mg/dL    Expected Outcomes  Short Term: Participant verbalizes understanding of the signs/symptoms and immediate care of hyper/hypoglycemia, proper foot care and importance of medication, aerobic/resistive exercise and nutrition plan for blood glucose control.;Long Term: Attainment of HbA1C < 7%.    Hypertension  Yes    Intervention  Monitor prescription use compliance.;Provide education on lifestyle modifcations including regular physical activity/exercise, weight management, moderate sodium restriction and increased consumption of fresh fruit, vegetables, and low fat dairy, alcohol  moderation, and smoking cessation.    Expected Outcomes  Long Term: Maintenance of blood pressure at goal levels.;Short Term: Continued assessment and intervention until BP  is < 140/35mm HG in hypertensive participants. < 130/87mm HG in hypertensive participants with diabetes, heart failure or chronic kidney disease.    Lipids  Yes    Intervention  Provide education and support for participant on nutrition & aerobic/resistive exercise along with prescribed medications to achieve LDL 70mg , HDL >40mg .    Expected Outcomes  Short Term: Participant states understanding of desired cholesterol values and is compliant with medications prescribed. Participant is following exercise prescription and nutrition guidelines.;Long Term: Cholesterol controlled with medications as prescribed, with individualized exercise RX and with personalized nutrition plan. Value goals: LDL < 70mg , HDL > 40 mg.       Core Components/Risk Factors/Patient Goals Review:  Goals and Risk Factor Review    Row Name 12/15/17 1324             Core Components/Risk Factors/Patient Goals Review   Personal Goals Review  Weight Management/Obesity;Lipids;Hypertension;Diabetes       Review  - Joselin is off to a good start to exercise. Due to [place]'s decondtioning and inability to walk long distances. Latrenda is only exercising on the nustep at this time       Expected Outcomes  Kyrstin will continue to take her medications as presribed and come to exercise at cardiac rehab for exercise.          Core Components/Risk Factors/Patient Goals at Discharge (Final Review):  Goals and Risk Factor Review - 12/15/17 1324      Core Components/Risk Factors/Patient Goals Review   Personal Goals Review  Weight Management/Obesity;Lipids;Hypertension;Diabetes    Review  -- Daesha is off to a good start to exercise. Due to [place]'s decondtioning and inability to walk long distances. Carmelina is only exercising on the nustep at this time    Expected Outcomes   Quintessa will continue to take her medications as presribed and come to exercise at cardiac rehab for exercise.       ITP Comments: ITP Comments    Row Name 12/01/17 1138 12/15/17 1322         ITP Comments  Dr. Fransico Him, Medical Director   30 day ITP Review. Patient with good attendance and participation in phase 2 cardiac rehab. [place] is off to a good slow start to exercise.         Comments: See ITP comments.Barnet Pall, RN,BSN 12/15/2017 1:34 PM

## 2017-12-16 ENCOUNTER — Encounter (HOSPITAL_COMMUNITY)
Admission: RE | Admit: 2017-12-16 | Discharge: 2017-12-16 | Disposition: A | Discharge status: Home / Self Care | Payer: Medicare HMO | Source: Ambulatory Visit | Attending: Cardiovascular Disease | Admitting: Cardiovascular Disease

## 2017-12-16 DIAGNOSIS — Z48812 Encounter for surgical aftercare following surgery on the circulatory system: Secondary | ICD-10-CM | POA: Diagnosis not present

## 2017-12-16 DIAGNOSIS — Z951 Presence of aortocoronary bypass graft: Secondary | ICD-10-CM

## 2017-12-19 ENCOUNTER — Encounter (HOSPITAL_COMMUNITY)
Admission: RE | Admit: 2017-12-19 | Discharge: 2017-12-19 | Disposition: A | Discharge status: Home / Self Care | Payer: Medicare HMO | Source: Ambulatory Visit | Attending: Cardiovascular Disease | Admitting: Cardiovascular Disease

## 2017-12-19 DIAGNOSIS — Z48812 Encounter for surgical aftercare following surgery on the circulatory system: Secondary | ICD-10-CM | POA: Diagnosis not present

## 2017-12-19 DIAGNOSIS — Z951 Presence of aortocoronary bypass graft: Secondary | ICD-10-CM | POA: Diagnosis not present

## 2017-12-19 NOTE — Progress Notes (Addendum)
[name] 67 y.o. female DOB: [birth year] MRN: [id number]      Nutrition Note  Dx: s/p CABG x 1  Meds reviewed. Coumadin, Glimepiride, Novolin 70/30 noted  Labs:   Lab Results  Component Value Date   HGBA1C 7.9 (H) 08/13/2017   Nutrition Note Spoke with pt. Nutrition plan survey reviewed with pt. Pt is not following the Therapeutic Lifestyle Changes diet at this time. Pt eats out daily and is not interested in cooking at home. Pt is diabetic. Last A1c indicates blood glucose not optimally controlled. Pt continues to check CBG's 3-5 times a day. Per discussion, pt is no longer drinking regular sodas due to giving up caffeine for [place]. Pt is now drinking diet gingerale. Barriers to blood glucose control include pt  eats out 2 meals/day and pt reports not always taking insulin as prescribed. Pt is on Coumadin and is aware of the need for consistent vitamin K intake. Pt states she avoids leafy greens because "I know I won't consistently eat greens." Pt states she does not add sodium to food. Pt educated re: sodium content of restaurant foods independent of adding salt. Pt expressed understanding of the information reviewed. Pt aware of nutrition education classes offered.  Nutrition Diagnosis ? Food-and nutrition-related knowledge deficit related to lack of exposure to information as related to diagnosis of: ? CVD ? DM ? Obesity related to excessive energy intake as evidenced by a BMI of 49.1  Nutrition Intervention ? Pt's individual nutrition plan reviewed with pt. ? Benefits of adopting Heart Healthy diet discussed when [employer] reviewed.    Nutrition Goal(s):  ? Pt to identify and limit food sources of saturated fat, trans fat, and sodium ? Pt to identify food quantities necessary to achieve weight loss of 6-24 lb (2.7-10.9 kg) at graduation from cardiac rehab. ? Improved blood glucose control as evidenced by pt's A1c trending from 7.9 toward less than 7.0.  Plan:  Pt to attend  nutrition classes ? Nutrition I ? Nutrition II ? Portion Distortion ? Diabetes Blitz ? Diabetes Q & A Will provide client-centered nutrition education as part of interdisciplinary care.   Monitor and evaluate progress toward nutrition goal with team.  Derek Mound, M.Ed, RD, LDN, CDE 12/19/2017 12:02 PM

## 2017-12-21 ENCOUNTER — Encounter (HOSPITAL_COMMUNITY)
Admission: RE | Admit: 2017-12-21 | Discharge: 2017-12-21 | Disposition: A | Discharge status: Home / Self Care | Payer: Medicare HMO | Source: Ambulatory Visit | Attending: Cardiovascular Disease | Admitting: Cardiovascular Disease

## 2017-12-21 DIAGNOSIS — Z951 Presence of aortocoronary bypass graft: Secondary | ICD-10-CM | POA: Diagnosis not present

## 2017-12-21 DIAGNOSIS — Z48812 Encounter for surgical aftercare following surgery on the circulatory system: Secondary | ICD-10-CM | POA: Diagnosis not present

## 2017-12-23 ENCOUNTER — Encounter (HOSPITAL_COMMUNITY)
Admission: RE | Admit: 2017-12-23 | Discharge: 2017-12-23 | Disposition: A | Discharge status: Home / Self Care | Payer: Medicare HMO | Source: Ambulatory Visit | Attending: Cardiovascular Disease | Admitting: Cardiovascular Disease

## 2017-12-23 DIAGNOSIS — Z48812 Encounter for surgical aftercare following surgery on the circulatory system: Secondary | ICD-10-CM | POA: Diagnosis not present

## 2017-12-23 DIAGNOSIS — Z951 Presence of aortocoronary bypass graft: Secondary | ICD-10-CM | POA: Diagnosis not present

## 2017-12-26 ENCOUNTER — Encounter (HOSPITAL_COMMUNITY)
Admission: RE | Admit: 2017-12-26 | Discharge: 2017-12-26 | Disposition: A | Discharge status: Home / Self Care | Payer: Medicare HMO | Source: Ambulatory Visit | Attending: Cardiovascular Disease | Admitting: Cardiovascular Disease

## 2017-12-26 DIAGNOSIS — Z951 Presence of aortocoronary bypass graft: Secondary | ICD-10-CM | POA: Diagnosis not present

## 2017-12-26 DIAGNOSIS — Z48812 Encounter for surgical aftercare following surgery on the circulatory system: Secondary | ICD-10-CM | POA: Diagnosis not present

## 2017-12-26 NOTE — Progress Notes (Signed)
I have reviewed a Home Exercise Prescription with [name] . [name] is not currently exercising at home.  The patient was advised to do chair exercises 2-3 days a week for 15 minutes 2x a day.  [name] and I discussed how to progress their exercise prescription. The patient stated that they understand the exercise prescription. We reviewed exercise guidelines, target heart rate during exercise, weather, and endpoints for exercise.  Patient is encouraged to come to me with any questions. I will continue to follow up with the patient to assist them with progression and safety.    Brandi Lair MS, ACSM CEP 12/26/2017 4:30 PM

## 2017-12-27 ENCOUNTER — Ambulatory Visit (INDEPENDENT_AMBULATORY_CARE_PROVIDER_SITE_OTHER): Payer: Medicare HMO | Admitting: Pharmacist

## 2017-12-27 DIAGNOSIS — I480 Paroxysmal atrial fibrillation: Secondary | ICD-10-CM | POA: Diagnosis not present

## 2017-12-27 DIAGNOSIS — Z7901 Long term (current) use of anticoagulants: Secondary | ICD-10-CM

## 2017-12-27 LAB — POCT INR: INR: 2.1

## 2017-12-28 ENCOUNTER — Encounter (HOSPITAL_COMMUNITY)
Admission: RE | Admit: 2017-12-28 | Discharge: 2017-12-28 | Disposition: A | Discharge status: Home / Self Care | Payer: Medicare HMO | Source: Ambulatory Visit | Attending: Cardiovascular Disease | Admitting: Cardiovascular Disease

## 2017-12-28 DIAGNOSIS — Z951 Presence of aortocoronary bypass graft: Secondary | ICD-10-CM

## 2017-12-28 DIAGNOSIS — Z48812 Encounter for surgical aftercare following surgery on the circulatory system: Secondary | ICD-10-CM | POA: Diagnosis not present

## 2017-12-29 ENCOUNTER — Other Ambulatory Visit: Payer: Self-pay | Admitting: Cardiovascular Disease

## 2017-12-30 ENCOUNTER — Encounter (HOSPITAL_COMMUNITY)
Admission: RE | Admit: 2017-12-30 | Discharge: 2017-12-30 | Disposition: A | Discharge status: Home / Self Care | Payer: Medicare HMO | Source: Ambulatory Visit | Attending: Cardiovascular Disease | Admitting: Cardiovascular Disease

## 2017-12-30 DIAGNOSIS — Z951 Presence of aortocoronary bypass graft: Secondary | ICD-10-CM | POA: Diagnosis not present

## 2017-12-30 DIAGNOSIS — Z48812 Encounter for surgical aftercare following surgery on the circulatory system: Secondary | ICD-10-CM | POA: Diagnosis not present

## 2018-01-02 ENCOUNTER — Encounter (HOSPITAL_COMMUNITY)
Admission: RE | Admit: 2018-01-02 | Discharge: 2018-01-02 | Disposition: A | Discharge status: Home / Self Care | Payer: Medicare HMO | Source: Ambulatory Visit | Attending: Cardiovascular Disease | Admitting: Cardiovascular Disease

## 2018-01-02 DIAGNOSIS — Z951 Presence of aortocoronary bypass graft: Secondary | ICD-10-CM

## 2018-01-02 DIAGNOSIS — Z48812 Encounter for surgical aftercare following surgery on the circulatory system: Secondary | ICD-10-CM | POA: Diagnosis not present

## 2018-01-04 ENCOUNTER — Encounter (HOSPITAL_COMMUNITY)
Admission: RE | Admit: 2018-01-04 | Discharge: 2018-01-04 | Disposition: A | Discharge status: Home / Self Care | Payer: Medicare HMO | Source: Ambulatory Visit | Attending: Cardiovascular Disease | Admitting: Cardiovascular Disease

## 2018-01-04 DIAGNOSIS — Z951 Presence of aortocoronary bypass graft: Secondary | ICD-10-CM

## 2018-01-04 DIAGNOSIS — Z48812 Encounter for surgical aftercare following surgery on the circulatory system: Secondary | ICD-10-CM | POA: Diagnosis not present

## 2018-01-06 ENCOUNTER — Encounter (HOSPITAL_COMMUNITY)
Admission: RE | Admit: 2018-01-06 | Discharge: 2018-01-06 | Disposition: A | Discharge status: Home / Self Care | Payer: Medicare HMO | Source: Ambulatory Visit | Attending: Cardiovascular Disease | Admitting: Cardiovascular Disease

## 2018-01-06 DIAGNOSIS — Z951 Presence of aortocoronary bypass graft: Secondary | ICD-10-CM

## 2018-01-06 DIAGNOSIS — Z48812 Encounter for surgical aftercare following surgery on the circulatory system: Secondary | ICD-10-CM | POA: Diagnosis not present

## 2018-01-09 ENCOUNTER — Encounter (HOSPITAL_COMMUNITY)
Admission: RE | Admit: 2018-01-09 | Discharge: 2018-01-09 | Disposition: A | Discharge status: Home / Self Care | Payer: Medicare HMO | Source: Ambulatory Visit | Attending: Cardiovascular Disease | Admitting: Cardiovascular Disease

## 2018-01-09 ENCOUNTER — Ambulatory Visit: Payer: Medicare HMO | Admitting: Cardiovascular Disease

## 2018-01-09 ENCOUNTER — Encounter: Payer: Self-pay | Admitting: Cardiovascular Disease

## 2018-01-09 VITALS — BP 130/60 | HR 72 | Ht 63.0 in | Wt 276.0 lb

## 2018-01-09 DIAGNOSIS — E7800 Pure hypercholesterolemia, unspecified: Secondary | ICD-10-CM | POA: Diagnosis not present

## 2018-01-09 DIAGNOSIS — I480 Paroxysmal atrial fibrillation: Secondary | ICD-10-CM

## 2018-01-09 DIAGNOSIS — E1169 Type 2 diabetes mellitus with other specified complication: Secondary | ICD-10-CM

## 2018-01-09 DIAGNOSIS — I10 Essential (primary) hypertension: Secondary | ICD-10-CM | POA: Diagnosis not present

## 2018-01-09 DIAGNOSIS — I2510 Atherosclerotic heart disease of native coronary artery without angina pectoris: Secondary | ICD-10-CM

## 2018-01-09 DIAGNOSIS — I739 Peripheral vascular disease, unspecified: Secondary | ICD-10-CM

## 2018-01-09 DIAGNOSIS — Z951 Presence of aortocoronary bypass graft: Secondary | ICD-10-CM

## 2018-01-09 DIAGNOSIS — E669 Obesity, unspecified: Secondary | ICD-10-CM

## 2018-01-09 DIAGNOSIS — Z48812 Encounter for surgical aftercare following surgery on the circulatory system: Secondary | ICD-10-CM | POA: Diagnosis not present

## 2018-01-09 DIAGNOSIS — I5032 Chronic diastolic (congestive) heart failure: Secondary | ICD-10-CM | POA: Diagnosis not present

## 2018-01-09 NOTE — Patient Instructions (Signed)
Dr Croitoru recommends that you schedule a follow-up appointment in 6 months. You will receive a reminder letter in the mail two months in advance. If you don't receive a letter, please call our office to schedule the follow-up appointment.  If you need a refill on your cardiac medications before your next appointment, please call your pharmacy. 

## 2018-01-09 NOTE — Progress Notes (Signed)
Patient ID: [name], female   DOB: 06-17-1951, 66 y.o.   MRN: [id number]  Patient ID: [name], female   DOB: Sep 14, 1951, 66 y.o.   MRN: [id number]    Cardiology Office Note    Date:  01/11/2018   ID:  [name], DOB [birth year], MRN [id number]  PCP:  [name], MD  Cardiologist:   Sanda Klein, MD   Chief Complaint  Patient presents with  . Follow-up    3 months  . Shortness of Breath  . Edema    Legs.    History of Present Illness:  [name] is a 67 y.o. female with CAD and PAD, ischemic cardiomyopathy, CHF and paroxysmal atrial fibrillation.  She is now roughly 5 months following single-vessel LIMA to LAD bypass for recurrent in-stent restenosis multiple PCI.  She has a history of recurrent paroxysmal atrial fibrillation, diabetes mellitus requiring insulin, morbid obesity.  She is still actively engaged in cardiac rehab and generally feels well.  She has no cardiovascular complaints.  The patient specifically denies any chest pain at rest or with exertion, dyspnea at rest or with exertion, orthopnea, paroxysmal nocturnal dyspnea, syncope, palpitations, focal neurological deficits, intermittent claudication, lower extremity edema, unexplained weight gain, cough, hemoptysis or wheezing.  Unfortunately, glycemic control remains difficult.  Her last hemoglobin A1c was 8.5%.  She has an appointment with Dr. Ashby Dawes on May 2.   In 2013 she received a drug-eluting stent to the LAD artery. In July 2015 she had high-grade in-stent restenosis treated with balloon angioplasty and placement of a new 312 mm drug-eluting Xience expedition stent to the distal edge of the previous stent. Incidental note made of a 50-60% mid AV groove left circumflex stenosis. In April 2017 she presented with chest pain and a nuclear study showed reversible ischemia after which she received another drug-eluting stent covering the 2 previous stents in the proximal  LAD. In November 2018 she returned again with in-stent restenosis and was referred for single-vessel LIMA to LAD bypass surgery.She has normal left ventricular systolic function and no congestive heart failure.  In addition she has history of PAD status post atherectomy of the left superficial femoral artery and paroxysmal atrial fibrillation (on chronic warfarin anticoagulation). Additional comorbid conditions include hypertension, obesity, type 2 diabetes mellitus and gastroesophageal reflux disease. She has a history of left upper lobe lobectomy for adenocarcinoma of the lung with curative intent February 2010. She has a history of inflammatory esophagitis (noneosinophilic), stricture (which was dilated) and cervical web. (Dr. Benson Norway, EGD 2016)    Past Medical History:  Diagnosis Date  . Arthritis    "knees" (02/05/2016)  . Cancer ([place]) 5/10   LUL Vats   . Coronary artery disease   . GERD (gastroesophageal reflux disease)    tx meds  . H/O hiatal hernia   . History of blood transfusion "several"   last april 2015 s/p knee surgery (02/05/2016)  . Hypercholesteremia   . Hypertension   . Lung cancer ([place])    2010, surgery 18% left lung no radiation or chemo  . Non-STEMI (non-ST elevated myocardial infarction) ([place]) 04/07/2012   See cath results below  . Paroxysmal atrial fibrillation ([place]) 04/07/2012   On Warfarin  . Peripheral vascular disease ([place]) 8/12   Lt SFA PTA  . Presence of stent in LAD coronary artery 04/07/2012   Xience Expedition DES 2.75 mm x 18 mm (dilated to 3.0 mm)  . Sleep apnea    does not wear  CPAP  . Type II diabetes mellitus (HCC)    insulin dependent    Past Surgical History:  Procedure Laterality Date  . ABDOMINAL HYSTERECTOMY  1990  . BREAST EXCISIONAL BIOPSY Right   . CARDIAC CATHETERIZATION  11/04/2008   patent RCA, LM, and Circ, nl EF  . CARDIAC CATHETERIZATION  02/20/2002   patent coronaries with the only abnormality being a smooth luminla irregularity in  the mid intermediate ramus branch no felt to be hemodynamically significant, nl LV  . CARDIAC CATHETERIZATION N/A 02/05/2016   Procedure: Left Heart Cath and Coronary Angiography;  Surgeon: Leonie Man, MD;  Location: [facility];  Service: Cardiovascular;  Laterality: N/A;  . CARDIAC CATHETERIZATION N/A 02/05/2016   Procedure: Coronary Stent Intervention;  Surgeon: Leonie Man, MD;  Location: [facility];  Service: Cardiovascular;  Laterality: N/A;  . CATARACT EXTRACTION W/ INTRAOCULAR LENS  IMPLANT, BILATERAL  2013  . CORONARY ANGIOPLASTY WITH STENT PLACEMENT  04/07/2012   Xience Expedition DES 2.55mm x 18 mm (dilated to 3.0 mm) to the prox LAD   . CORONARY ANGIOPLASTY WITH STENT PLACEMENT  2013; 2015  . CORONARY ARTERY BYPASS GRAFT N/A 08/17/2017   Procedure: CORONARY ARTERY BYPASS GRAFTING (CABG) TIMES 1 USING LEFT INTERNAL MAMMARY ARTERY;  Surgeon: Melrose Nakayama, MD;  Location: [facility];  Service: Open Heart Surgery;  Laterality: N/A;  . DILATION AND CURETTAGE OF UTERUS  <1990  . ESOPHAGOGASTRODUODENOSCOPY N/A 05/23/2015   Procedure: ESOPHAGOGASTRODUODENOSCOPY (EGD);  Surgeon: Carol Ada, MD;  Location: [facility];  Service: Endoscopy;  Laterality: N/A;  . ESOPHAGOGASTRODUODENOSCOPY (EGD) WITH PROPOFOL N/A 06/13/2015   Procedure: ESOPHAGOGASTRODUODENOSCOPY (EGD) WITH PROPOFOL;  Surgeon: Carol Ada, MD;  Location: [facility];  Service: Endoscopy;  Laterality: N/A;  . FRACTURE SURGERY    . HAMMER TOE SURGERY Bilateral 1993   with screws, on screw removed  . JOINT REPLACEMENT    . KNEE ARTHROSCOPY Bilateral    left x2, right x1  . LAPAROSCOPIC CHOLECYSTECTOMY    . LEFT HEART CATH AND CORONARY ANGIOGRAPHY N/A 08/11/2017   Procedure: LEFT HEART CATH AND CORONARY ANGIOGRAPHY;  Surgeon: Martinique, Peter M, MD;  Location: [facility];  Service: Cardiovascular;  Laterality: N/A;  . LEFT HEART CATHETERIZATION WITH CORONARY ANGIOGRAM N/A 04/07/2012   Procedure:  LEFT HEART CATHETERIZATION WITH CORONARY ANGIOGRAM;  Surgeon: [name], MD;  Location: [facility];  Service: Cardiovascular;  Laterality: N/A;  . LEFT HEART CATHETERIZATION WITH CORONARY ANGIOGRAM N/A 05/27/2014   Procedure: LEFT HEART CATHETERIZATION WITH CORONARY ANGIOGRAM;  Surgeon: [name], MD; LAD 99% ISR, CFX 50-60%, RCA (dominant) no sig dz, EF 60%  . LOWER EXTREMITY ANGIOGRAM  05/17/11   directional atherectomy to the prox L SFA using a LX Man TurboHawk, ballooned with a [employer] balloon   . ORIF ANKLE FRACTURE  07/09/2012   Procedure: OPEN REDUCTION INTERNAL FIXATION (ORIF) ANKLE FRACTURE;  Surgeon: Meredith Pel, MD;  Location: [facility];  Service: Orthopedics;  Laterality: Left;  open reduction internal fixation trimalleolar ankle fracture medial malleolous fixation  . PERCUTANEOUS CORONARY STENT INTERVENTION (PCI-S) N/A 04/07/2012   Procedure: PERCUTANEOUS CORONARY STENT INTERVENTION (PCI-S);  Surgeon: [name], MD;  Location: [facility];  Service: Cardiovascular;  Laterality: N/A;  . PERCUTANEOUS CORONARY STENT INTERVENTION (PCI-S)  05/27/2014   Procedure: PERCUTANEOUS CORONARY STENT INTERVENTION (PCI-S);  Surgeon: [name], MD; 3 mm x 12 mm long Xience Xpedition DES to the proximal LAD  .  SAVORY DILATION N/A 06/13/2015   Procedure: SAVORY DILATION;  Surgeon: Carol Ada, MD;  [facility];  Service: Endoscopy;  Laterality: N/A;  . TEE WITHOUT CARDIOVERSION N/A 08/17/2017   Procedure: TRANSESOPHAGEAL ECHOCARDIOGRAM (TEE);  Surgeon: Melrose Nakayama, MD;  Location: [facility];  Service: Open Heart Surgery;  Laterality: N/A;  . TONSILLECTOMY    . TOTAL KNEE ARTHROPLASTY Left 2003  . TOTAL KNEE REVISION Left 11/05/2013   Procedure: LEFT TOTAL KNEE RESECTION;  Surgeon: Mauri Pole, MD;  Location: [facility];  Service: Orthopedics;  Laterality: Left;  . TOTAL KNEE REVISION Left 2007   opened in 2006 and cleaned out, 2007 revision  . TOTAL KNEE  REVISION Left 12/31/2013   Procedure: RE-INPLANTATION LEFT TOTAL KNEE ;  Surgeon: Mauri Pole, MD;  Location: [facility];  Service: Orthopedics;  Laterality: Left;  [facility] VIDEO ASSISTED THORACOSCOPY (VATS)/ LOBECTOMY  01/2009    Current Medications: Outpatient Medications Prior to Visit  Medication Sig Dispense Refill  . aspirin EC 81 MG tablet Take 81 mg by mouth daily.    . furosemide (LASIX) 40 MG tablet Take 1 tablet (40 mg total) by mouth daily. (Patient taking differently: Take 40 mg by mouth 3 (three) times a week. Take 40 mg Monday, Wednesday, and Friday each week.) 90 tablet 3  . glimepiride (AMARYL) 2 MG tablet Take 2 mg by mouth daily with breakfast.    . insulin NPH-regular Human (NOVOLIN 70/30) (70-30) 100 UNIT/ML injection Inject 30-34 Units into the skin 2 (two) times daily with a meal. Prior to surgery, she took 42 units in the morning 20 units in the evening. As of 08/23/2017, she is on 20 units in am and 20 units in the pm. Her Insulin gradually needs to be increased to pre surgery doses as glucose levels allow. (Patient taking differently: Inject 20 Units into the skin 2 (two) times daily before a meal. ) 10 mL 11  . lisinopril-hydrochlorothiazide (PRINZIDE,ZESTORETIC) 20-25 MG per tablet Take 1 tablet by mouth daily.    . metoprolol tartrate (LOPRESSOR) 25 MG tablet Take 1 tablet (25 mg total) by mouth 3 (three) times daily. 60 tablet 11  . pantoprazole (PROTONIX) 40 MG tablet TAKE 1 TABLET BY MOUTH EVERY DAY (Patient taking differently: Take 40 mg by mouth once a day) 90 tablet 2  . potassium chloride (MICRO-K) 10 MEQ CR capsule Take 1 capsule (10 mEq total) by mouth daily. 30 capsule 6  . simvastatin (ZOCOR) 40 MG tablet TAKE 1 TABLET BY MOUTH EVERYDAY AT BEDTIME 90 tablet 0  . Vitamin D, Cholecalciferol, 1000 units TABS Take 1,000 Units by mouth every morning.     . warfarin (COUMADIN) 7.5 MG tablet Take 7.5 mg by mouth daily. 7.5 mg in the evening on Sun/Tues/Thurs/Sat and 3.75 mg  on Mon/Wed/Fri    . warfarin (COUMADIN) 7.5 MG tablet TAKE 1/2 TO 1 TABLET DAILY AS DIRECTED BY COUMADIN CLINIC 90 tablet 0   No facility-administered medications prior to visit.      Allergies:   Levofloxacin and Morphine and related   Social History   Socioeconomic History  . Marital status: Single    Spouse name: Not on file  . Number of children: Not on file  . Years of education: Not on file  . Highest education level: Not on file  Occupational History  . Occupation: Retired  Scientific laboratory technician  . Financial resource strain: Not on file  . Food insecurity:    Worry: Not on  file    Inability: Not on file  . Transportation needs:    Medical: Not on file    Non-medical: Not on file  Tobacco Use  . Smoking status: Former Smoker    Packs/day: 1.00    Years: 20.00    Pack years: 20.00    Types: Cigarettes    Last attempt to quit: 09/27/1984    Years since quitting: 33.3  . Smokeless tobacco: Never Used  Substance and Sexual Activity  . Alcohol use: No  . Drug use: No  . Sexual activity: Not on file  Lifestyle  . Physical activity:    Days per week: Not on file    Minutes per session: Not on file  . Stress: Not on file  Relationships  . Social connections:    Talks on phone: Not on file    Gets together: Not on file    Attends religious service: Not on file    Active member of club or organization: Not on file    Attends meetings of clubs or organizations: Not on file    Relationship status: Not on file  Other Topics Concern  . Not on file  Social History Narrative   Patient lives alone.     Family History:  The patient's family history includes Coronary artery disease in her brother; Hypertension in her mother and sister.   ROS:   Please see the history of present illness.    ROS All other systems reviewed and are negative.   PHYSICAL EXAM:   VS:  BP 130/60 (BP Location: Left Arm, Patient Position: Sitting, Cuff Size: Large)   Pulse 72   Ht 5\' 3"  (1.6 m)    Wt 276 lb (125.2 kg)   BMI 48.89 kg/m     General: Alert, oriented x3, no distress, morbid obesity limits her exam Head: no evidence of trauma, PERRL, EOMI, no exophtalmos or lid lag, no myxedema, no xanthelasma; normal ears, nose and oropharynx Neck: normal jugular venous pulsations and no hepatojugular reflux; brisk carotid pulses without delay and no carotid bruits Chest: clear to auscultation, no signs of consolidation by percussion or palpation, normal fremitus, symmetrical and full respiratory excursions Cardiovascular: normal position and quality of the apical impulse, regular rhythm, normal first and widely split second heart sounds, no murmurs, rubs or gallops Abdomen: no tenderness or distention, no masses by palpation, no abnormal pulsatility or arterial bruits, normal bowel sounds, no hepatosplenomegaly Extremities: no clubbing, cyanosis or edema; 2+ radial, ulnar and brachial pulses bilaterally; 2+ right femoral, posterior tibial and dorsalis pedis pulses; 2+ left femoral, posterior tibial and dorsalis pedis pulses; no subclavian or femoral bruits Neurological: grossly nonfocal Psych: Normal mood and affect   Wt Readings from Last 3 Encounters:  01/09/18 276 lb (125.2 kg)  12/01/17 272 lb 14.9 oz (123.8 kg)  11/23/17 277 lb (125.6 kg)      Studies/Labs Reviewed:   EKG:  EKG is ordered today. It shows sinus rhythm with old right bundle branch block and lateral T wave inversion, unchanged from previous tracings, QTC 487 ms  Recent Labs: 10/19/2016 Potassium 3.9, creatinine 1.0, normal LFTs, normal TSH, hemoglobin 10.6 (02/06/2016  Lipid Panel    Component Value Date/Time   CHOL 133 08/10/2017 1141   TRIG 78 08/10/2017 1141   HDL 46 08/10/2017 1141   CHOLHDL 2.9 08/10/2017 1141   VLDL 16 08/10/2017 1141   LDLCALC 71 08/10/2017 1141   LDLDIRECT 143 (H) 10/10/2008 2042   10/19/2016 LDL 64,  HDL 49, total cholesterol 130, triglycerides 83   ASSESSMENT:    1.  Coronary artery disease involving native coronary artery of native heart without angina pectoris   2. Chronic diastolic heart failure ([place])   3. Essential hypertension   4. Diabetes mellitus type 2 in obese (HCC)   5. Paroxysmal atrial fibrillation ([place])   6. Hypercholesterolemia   7. Morbid obesity ([place])   8. PAD (peripheral artery disease) (HCC)      PLAN:  In order of problems listed above:  1. CAD: Asymptomatic 5 months after single-vessel LIMA bypass to the LAD.  Engaged in cardiac rehab. 2. CHF: Appears euvolemic.  Not have exercise limiting dyspnea.  Her knee problems are a bigger issue. 3. HTN: Good control 4. DM, poorly controlled: Glycemic control has actually deteriorated.  I wonder if she would be felt to be a good candidate for SGLT2 inhibitors like Jardiance.  I suggested she discuss that with Dr. Ashby Dawes at their upcoming appointment in a few days. 5. HLP: LDL at target, generally favorable lipid profile 6. Obesity: Obviously this has a lot to do with her poor glycemic control and insulin resistance.  I think SGLT2 inhibitors might assist with weight loss. 7. PAF: She has not had any clinically evident episodes since her bypass procedure.  Appropriately anticoagulated and usually in target range on warfarin.  CHADSVasc 6 (age, gender, DM, CHF, CAD/PAD, HTN). 8. PAD: Denies intermittent claudication, possibly because her knee problems are more severe than the vascular problems.    Medication Adjustments/Labs and Tests Ordered: Current medicines are reviewed at length with the patient today.  Concerns regarding medicines are outlined above.  Medication changes, Labs and Tests ordered today are listed in the Patient Instructions below. Patient Instructions  Dr Sallyanne Kuster recommends that you schedule a follow-up appointment in 6 months. You will receive a reminder letter in the mail two months in advance. If you don't receive a letter, please call our office to schedule the  follow-up appointment.  If you need a refill on your cardiac medications before your next appointment, please call your pharmacy.    Signed, Sanda Klein, MD  01/11/2018 4:39 PM    [address], [place], [place]  [address] Phone: [phone]; Fax: [phone]

## 2018-01-10 ENCOUNTER — Other Ambulatory Visit: Payer: Self-pay | Admitting: *Deleted

## 2018-01-10 ENCOUNTER — Encounter: Payer: Self-pay | Admitting: *Deleted

## 2018-01-10 NOTE — Patient Outreach (Signed)
[facility]) Care Management  01/10/2018  [name] Aug 18, 1951 [id number]   RN Health Coach Monthly Outreach  Referral Date:  11/23/2017 Referral Source:  Transfer from [facility] Nurse Reason for Referral:  Continued Disease Management Education Insurance:  [facility] Medicare   Outreach Attempt:  Successful telephone outreach to patient for monthly follow up.  HIPAA verified with patient.  Patient stating she is doing well.  Denies any sick days.  Reports this mornings fasting blood sugar was 135 with morning ranges of 100-130's.  Does report some blood sugars in 200's pending what she has eaten.  Denies any hypoglycemic episodes.  Reports she is attending Outpatient Cardiac Rehab without any difficulties.  Appointments:  Patient reports last seeing her primary care provider, Dr. Ashby [name] on 1/23/219 and has scheduled follow up appointment on 01/26/2018 with labs scheduled the week prior.  Plan:  RN Health Coach will make next monthly telephone outreach to patient in the month of May.   [place] [phone] [name]

## 2018-01-11 ENCOUNTER — Encounter (HOSPITAL_COMMUNITY)
Admission: RE | Admit: 2018-01-11 | Discharge: 2018-01-11 | Disposition: A | Discharge status: Home / Self Care | Payer: Medicare HMO | Source: Ambulatory Visit | Attending: Cardiovascular Disease | Admitting: Cardiovascular Disease

## 2018-01-11 DIAGNOSIS — Z951 Presence of aortocoronary bypass graft: Secondary | ICD-10-CM | POA: Diagnosis not present

## 2018-01-11 DIAGNOSIS — Z48812 Encounter for surgical aftercare following surgery on the circulatory system: Secondary | ICD-10-CM | POA: Diagnosis not present

## 2018-01-11 DIAGNOSIS — E669 Obesity, unspecified: Secondary | ICD-10-CM

## 2018-01-11 DIAGNOSIS — E1169 Type 2 diabetes mellitus with other specified complication: Secondary | ICD-10-CM | POA: Insufficient documentation

## 2018-01-11 DIAGNOSIS — E7800 Pure hypercholesterolemia, unspecified: Secondary | ICD-10-CM | POA: Insufficient documentation

## 2018-01-12 ENCOUNTER — Encounter (HOSPITAL_COMMUNITY): Payer: Self-pay

## 2018-01-12 NOTE — Progress Notes (Signed)
Cardiac Individual Treatment Plan  Patient Details  Name: [name] MRN: [id number] Date of Birth: [birth year] Referring Provider:     CARDIAC REHAB PHASE II ORIENTATION from 12/01/2017 in [facility]  Referring Provider  Croitoru, Dani Gobble MD      Initial Encounter Date:    Long from 12/01/2017 in [place]  Date  12/01/17  Referring Provider  Croitoru, Mihai MD      Visit Diagnosis: S/P CABG x 1  Patient's Home Medications on Admission:  Current Outpatient Medications:  .  aspirin EC 81 MG tablet, Take 81 mg by mouth daily., Disp: , Rfl:  .  furosemide (LASIX) 40 MG tablet, Take 1 tablet (40 mg total) by mouth daily. (Patient taking differently: Take 40 mg by mouth 3 (three) times a week. Take 40 mg Monday, Wednesday, and Friday each week.), Disp: 90 tablet, Rfl: 3 .  glimepiride (AMARYL) 2 MG tablet, Take 2 mg by mouth daily with breakfast., Disp: , Rfl:  .  insulin NPH-regular Human (NOVOLIN 70/30) (70-30) 100 UNIT/ML injection, Inject 30-34 Units into the skin 2 (two) times daily with a meal. Prior to surgery, she took 42 units in the morning 20 units in the evening. As of 08/23/2017, she is on 20 units in am and 20 units in the pm. Her Insulin gradually needs to be increased to pre surgery doses as glucose levels allow. (Patient taking differently: Inject 20 Units into the skin 2 (two) times daily before a meal. ), Disp: 10 mL, Rfl: 11 .  lisinopril-hydrochlorothiazide (PRINZIDE,ZESTORETIC) 20-25 MG per tablet, Take 1 tablet by mouth daily., Disp: , Rfl:  .  metoprolol tartrate (LOPRESSOR) 25 MG tablet, Take 1 tablet (25 mg total) by mouth 3 (three) times daily., Disp: 60 tablet, Rfl: 11 .  pantoprazole (PROTONIX) 40 MG tablet, TAKE 1 TABLET BY MOUTH EVERY DAY (Patient taking differently: Take 40 mg by mouth once a day), Disp: 90 tablet, Rfl: 2 .  potassium chloride (MICRO-K) 10 MEQ CR  capsule, Take 1 capsule (10 mEq total) by mouth daily., Disp: 30 capsule, Rfl: 6 .  simvastatin (ZOCOR) 40 MG tablet, TAKE 1 TABLET BY MOUTH EVERYDAY AT BEDTIME, Disp: 90 tablet, Rfl: 0 .  Vitamin D, Cholecalciferol, 1000 units TABS, Take 1,000 Units by mouth every morning. , Disp: , Rfl:  .  warfarin (COUMADIN) 7.5 MG tablet, Take 7.5 mg by mouth daily. 7.5 mg in the evening on Sun/Tues/Thurs/Sat and 3.75 mg on Mon/Wed/Fri, Disp: , Rfl:  .  warfarin (COUMADIN) 7.5 MG tablet, TAKE 1/2 TO 1 TABLET DAILY AS DIRECTED BY COUMADIN CLINIC, Disp: 90 tablet, Rfl: 0  Past Medical History: Past Medical History:  Diagnosis Date  . Arthritis    "knees" (02/05/2016)  . Cancer ([place]) 5/10   [facility]   . Coronary artery disease   . GERD (gastroesophageal reflux disease)    tx meds  . H/O hiatal hernia   . History of blood transfusion "several"   last april 2015 s/p knee surgery (02/05/2016)  . Hypercholesteremia   . Hypertension   . Lung cancer ([place])    2010, surgery 18% left lung no radiation or chemo  . Non-STEMI (non-ST elevated myocardial infarction) ([place]) 04/07/2012   See cath results below  . Paroxysmal atrial fibrillation ([place]) 04/07/2012   On Warfarin  . Peripheral vascular disease ([place]) 8/12   Lt SFA PTA  . Presence of stent in LAD  coronary artery 04/07/2012   Xience Expedition DES 2.75 mm x 18 mm (dilated to 3.0 mm)  . Sleep apnea    does not wear CPAP  . Type II diabetes mellitus ([facility])    insulin dependent    Tobacco Use: Social History   Tobacco Use  Smoking Status Former Smoker  . Packs/day: 1.00  . Years: 20.00  . Pack years: 20.00  . Types: Cigarettes  . Last attempt to quit: 09/27/1984  . Years since quitting: 33.3  Smokeless Tobacco Never Used    Labs: Recent Chemical engineer    Labs for ITP Cardiac and Pulmonary Rehab Latest Ref Rng & Units 08/17/2017 08/17/2017 08/17/2017 08/17/2017 08/17/2017   Cholestrol 0 - 200 mg/dL - - - - -   LDLCALC 0 - 99 mg/dL - - - -  -   LDLDIRECT mg/dL - - - - -   HDL >40 mg/dL - - - - -   Trlycerides <150 mg/dL - - - - -   Hemoglobin A1c 4.8 - 5.6 % - - - - -   PHART 7.350 - 7.450 7.520(H) 7.426 7.422 7.363 -   PCO2ART 32.0 - 48.0 mmHg 32.9 35.6 34.8 41.2 -   HCO3 20.0 - 28.0 mmol/L 26.9 24.0 22.9 23.7 -   TCO2 22 - 32 mmol/L _0 ACIDBASEDEF 0.0 - 2.0 mmol/L - 1.0 2.0 2.0 -   O2SAT % 100.0 99.0 100.0 100.0 -      Capillary Blood Glucose: Lab Results  Component Value Date   GLUCAP 198 (H) 12/14/2017   GLUCAP 181 (H) 12/14/2017   GLUCAP 142 (H) 12/12/2017   GLUCAP 147 (H) 12/12/2017   GLUCAP 214 (H) 12/09/2017     Exercise Target Goals:    Exercise Program Goal: Individual exercise prescription set using results from initial 6 min walk test and THRR while considering  patient's activity barriers and safety.   Exercise Prescription Goal: Initial exercise prescription builds to 30-45 minutes a day of aerobic activity, 2-3 days per week.  Home exercise guidelines will be given to patient during program as part of exercise prescription that the participant will acknowledge.  Activity Barriers & Risk Stratification: Activity Barriers & Cardiac Risk Stratification - 12/01/17 1149      Activity Barriers & Cardiac Risk Stratification   Activity Barriers  Arthritis;Joint Problems;Muscular Weakness;Deconditioning;Left Knee Replacement;Other (comment)    Comments  Severe Knee Pain and Severe Deconditioning     Cardiac Risk Stratification  High       6 Minute Walk: 6 Minute Walk    Row Name 12/01/17 1141         6 Minute Walk   Phase  Initial     Distance  151 feet     Walk Time  2.25 minutes     # of Rest Breaks  1     MPH  0.29     METS  -0.27     RPE  15     Perceived Dyspnea   3     VO2 Peak  -0.94     Symptoms  Yes (comment)     Comments  WheelChair and Gait Belt was used to assist pt with walk. Pt walked for a total of 2 mins and 25 secs. Pt took 1 rest break for 35 seconds. Pt  rated dyspnea 3/4 and had fatigued.      Resting HR  68 bpm     Resting BP  108/62     Resting Oxygen Saturation   99 %     Exercise Oxygen Saturation  during 6 min walk  100 %     Max Ex. HR  96 bpm     Max Ex. BP  118/64     2 Minute Post BP  116/58        Oxygen Initial Assessment:   Oxygen Re-Evaluation:   Oxygen Discharge (Final Oxygen Re-Evaluation):   Initial Exercise Prescription: Initial Exercise Prescription - 12/01/17 1100      Date of Initial Exercise RX and Referring Provider   Date  12/01/17    Referring Provider  Croitoru, Mihai MD      NuStep   Level  1    SPM  70    Minutes  30    METs  1      Prescription Details   Frequency (times per week)  3x    Duration  Progress to 30 minutes of continuous aerobic without signs/symptoms of physical distress      Intensity   THRR 40-80% of Max Heartrate  61-122    Ratings of Perceived Exertion  11-13    Perceived Dyspnea  0-4      Progression   Progression  Continue progressive overload as per policy without signs/symptoms or physical distress.      Resistance Training   Training Prescription  Yes    Weight  2lbs    Reps  10-15       Perform Capillary Blood Glucose checks as needed.  Exercise Prescription Changes: Exercise Prescription Changes    Row Name 12/07/17 1440 12/14/17 1600 12/28/17 0926 01/09/18 1712       Response to Exercise   Blood Pressure (Admit)  114/80  118/62  132/78  122/60    Blood Pressure (Exercise)  122/70  122/84  140/80  128/70    Blood Pressure (Exit)  122/60  120/68  124/68  128/62    Heart Rate (Admit)  74 bpm  94 bpm  75 bpm  71 bpm    Heart Rate (Exercise)  87 bpm  109 bpm  91 bpm  87 bpm    Heart Rate (Exit)  74 bpm  90 bpm  75 bpm  71 bpm    Rating of Perceived Exertion (Exercise)  13  11  11  12     Perceived Dyspnea (Exercise)  0  0  0  0    Symptoms  None  -  None  None     Comments  Pt oriented to exercise equipment   -  -  -    Duration  Progress to 30  minutes of  aerobic without signs/symptoms of physical distress  Continue with 30 min of aerobic exercise without signs/symptoms of physical distress.  Continue with 30 min of aerobic exercise without signs/symptoms of physical distress.  Continue with 30 min of aerobic exercise without signs/symptoms of physical distress.    Intensity  THRR New  THRR unchanged  THRR unchanged  THRR unchanged      Progression   Progression  Continue to progress workloads to maintain intensity without signs/symptoms of physical distress.  Continue to progress workloads to maintain intensity without signs/symptoms of physical distress.  Continue to progress workloads to maintain intensity without signs/symptoms of physical distress.  Continue to progress workloads to maintain intensity without signs/symptoms of physical distress.    Average METs  1.8  1.5  1.9  1.8  Resistance Training   Training Prescription  No  Yes  No  Yes    Weight  -  2lbs  -  3lbs    Reps  -  10-15  -  10-15    Time  -  10 Minutes  -  10 Minutes      Interval Training   Interval Training  No  No  No  No      NuStep   Level  1  1  3  3     SPM  70  75  80  80    Minutes  30  30  30  30     METs  1.8  1.5  1.9  1.8      Home Exercise Plan   Plans to continue exercise at  -  -  Home (comment) Chair Exercises  Home (comment) Chair Exercises     Frequency  -  -  Add 2 additional days to program exercise sessions.  Add 2 additional days to program exercise sessions.    Initial Home Exercises Provided  -  -  12/26/17  12/26/17       Exercise Comments: Exercise Comments    Row Name 12/07/17 1442 12/14/17 1639 12/26/17 1635 01/11/18 1714     Exercise Comments  Pt off to a good start with exercise. Pt was oriented to exercise equipment.   Pt is tolerating exercise workloads depsite severe limitations due to knee problems. Will continue to work with pt on improving functional fitness and improving stamina.  Reviewed HEP with pt. Pt  encouraged start chair exercises 2-3x a week. Will continue to monitor and follow pt.   Reviewed METs and Goals with pt. Pt has major orthopedic limitations and chronic knee pain. Despite these limitations, pt put forth effort with exercise and does what she can. Will continue to work with pt to increase average on Nustep.        Exercise Goals and Review: Exercise Goals    Row Name 12/01/17 1155             Exercise Goals   Increase Physical Activity  Yes       Intervention  Provide advice, education, support and counseling about physical activity/exercise needs.;Develop an individualized exercise prescription for aerobic and resistive training based on initial evaluation findings, risk stratification, comorbidities and participant's personal goals.       Expected Outcomes  Short Term: Attend rehab on a regular basis to increase amount of physical activity.;Long Term: Add in home exercise to make exercise part of routine and to increase amount of physical activity.;Long Term: Exercising regularly at least 3-5 days a week.       Increase Strength and Stamina  Yes       Intervention  Provide advice, education, support and counseling about physical activity/exercise needs.;Develop an individualized exercise prescription for aerobic and resistive training based on initial evaluation findings, risk stratification, comorbidities and participant's personal goals.       Expected Outcomes  Short Term: Increase workloads from initial exercise prescription for resistance, speed, and METs.;Short Term: Perform resistance training exercises routinely during rehab and add in resistance training at home;Long Term: Improve cardiorespiratory fitness, muscular endurance and strength as measured by increased METs and functional capacity (6MWT)       Able to understand and use rate of perceived exertion (RPE) scale  Yes       Intervention  Provide education and explanation on how to use RPE  scale       Expected  Outcomes  Short Term: Able to use RPE daily in rehab to express subjective intensity level;Long Term:  Able to use RPE to guide intensity level when exercising independently       Able to understand and use Dyspnea scale  Yes       Intervention  Provide education and explanation on how to use Dyspnea scale       Expected Outcomes  Short Term: Able to use Dyspnea scale daily in rehab to express subjective sense of shortness of breath during exertion;Long Term: Able to use Dyspnea scale to guide intensity level when exercising independently       Knowledge and understanding of Target Heart Rate Range (THRR)  Yes       Intervention  Provide education and explanation of THRR including how the numbers were predicted and where they are located for reference       Expected Outcomes  Short Term: Able to state/look up THRR;Short Term: Able to use daily as guideline for intensity in rehab;Long Term: Able to use THRR to govern intensity when exercising independently       Able to check pulse independently  Yes       Intervention  Provide education and demonstration on how to check pulse in carotid and radial arteries.;Review the importance of being able to check your own pulse for safety during independent exercise       Expected Outcomes  Short Term: Able to explain why pulse checking is important during independent exercise;Long Term: Able to check pulse independently and accurately       Understanding of Exercise Prescription  Yes       Intervention  Provide education, explanation, and written materials on patient's individual exercise prescription       Expected Outcomes  Short Term: Able to explain program exercise prescription;Long Term: Able to explain home exercise prescription to exercise independently          Exercise Goals Re-Evaluation : Exercise Goals Re-Evaluation    Row Name 12/14/17 1644 12/26/17 1637 01/11/18 1715         Exercise Goal Re-Evaluation   Exercise Goals Review  Increase  Physical Activity;Able to understand and use rate of perceived exertion (RPE) scale;Understanding of Exercise Prescription;Knowledge and understanding of Target Heart Rate Range (THRR);Increase Strength and Stamina;Able to check pulse independently  Increase Physical Activity;Able to understand and use rate of perceived exertion (RPE) scale;Knowledge and understanding of Target Heart Rate Range (THRR);Understanding of Exercise Prescription;Increase Strength and Stamina;Able to check pulse independently  Increase Physical Activity;Increase Strength and Stamina;Understanding of Exercise Prescription     Comments  Pt is severely limited by chronic knee pain and deconditioned. However, pt is consistenly coming to exercise at rehab and puts forth great effort. Will continue to work with pt to improve strength and stamina. Pt is tolerating workloads well and is able to exercise on Nustep for 30 minutes with minimal difficullty.   Reviewed HEP with pt. Reviewed RPE Scale, THRR, endpoints of exercises, weather conditions, importance of warmup and cool down.   Despite pt's limitations with knees, pt states the exercise is helping her. Pt has increased to 3lbs weights. Pt is still averaging about 1.8 METs. Will work with pt to try and improve that to 2.0 METs.      Expected Outcomes  Will follow up with pt about plans for exercise at home. Will suggest to pt chair exercises.  Pt will continue to  increase cardiorespiratory fitness. Will continue to monitor and progress pt as tolerated.   Pt will begin to do chair exercises 2-3x a week in addition to exericse at Cardiac Rehab. Pt will continue to monitor and follow up with pt.  Pt has been doing chair exercises 2 days a week in addition to cardiac rehab. Will continue to improve cardiovascular strength and funtional mobility.          Discharge Exercise Prescription (Final Exercise Prescription Changes): Exercise Prescription Changes - 01/09/18 1712      Response to  Exercise   Blood Pressure (Admit)  122/60    Blood Pressure (Exercise)  128/70    Blood Pressure (Exit)  128/62    Heart Rate (Admit)  71 bpm    Heart Rate (Exercise)  87 bpm    Heart Rate (Exit)  71 bpm    Rating of Perceived Exertion (Exercise)  12    Perceived Dyspnea (Exercise)  0    Symptoms  None     Duration  Continue with 30 min of aerobic exercise without signs/symptoms of physical distress.    Intensity  THRR unchanged      Progression   Progression  Continue to progress workloads to maintain intensity without signs/symptoms of physical distress.    Average METs  1.8      Resistance Training   Training Prescription  Yes    Weight  3lbs    Reps  10-15    Time  10 Minutes      Interval Training   Interval Training  No      NuStep   Level  3    SPM  80    Minutes  30    METs  1.8      Home Exercise Plan   Plans to continue exercise at  Home (comment) Chair Exercises     Frequency  Add 2 additional days to program exercise sessions.    Initial Home Exercises Provided  12/26/17       Nutrition:  Target Goals: Understanding of nutrition guidelines, daily intake of sodium 1500mg , cholesterol 200mg , calories 30% from fat and 7% or less from saturated fats, daily to have 5 or more servings of fruits and vegetables.  Biometrics: Pre Biometrics - 12/01/17 1150      Pre Biometrics   Height  5\' 3"  (1.6 m)    Weight  272 lb 14.9 oz (123.8 kg)    Waist Circumference  48.5 inches    Hip Circumference  59 inches    Waist to Hip Ratio  0.82 %    BMI (Calculated)  48.36    Triceps Skinfold  25 mm    % Body Fat  54.8 %    Grip Strength  24 kg    Flexibility  0 in    Single Leg Stand  0 seconds        Nutrition Therapy Plan and Nutrition Goals: Nutrition Therapy & Goals - 12/01/17 1346      Nutrition Therapy   Diet  Carb Modified, Heart Healthy    Drug/Food Interactions  Coumadin/Vit K      Personal Nutrition Goals   Nutrition Goal  Pt to identify and limit  food sources of saturated fat, trans fat, and sodium    Personal Goal #2  Pt to identify food quantities necessary to achieve weight loss of 6-24 lb (2.7-10.9 kg) at graduation from cardiac rehab.    Personal Goal #3  Improved blood glucose  control as evidenced by pt's A1c trending from 7.9 toward less than 7.0.      Intervention Plan   Intervention  Prescribe, educate and counsel regarding individualized specific dietary modifications aiming towards targeted core components such as weight, hypertension, lipid management, diabetes, heart failure and other comorbidities.    Expected Outcomes  Short Term Goal: Understand basic principles of dietary content, such as calories, fat, sodium, cholesterol and nutrients.;Long Term Goal: Adherence to prescribed nutrition plan.       Nutrition Assessments: Nutrition Assessments - 12/01/17 1346      MEDFICTS Scores   Pre Score  93       Nutrition Goals Re-Evaluation:   Nutrition Goals Re-Evaluation:   Nutrition Goals Discharge (Final Nutrition Goals Re-Evaluation):   Psychosocial: Target Goals: Acknowledge presence or absence of significant depression and/or stress, maximize coping skills, provide positive support system. Participant is able to verbalize types and ability to use techniques and skills needed for reducing stress and depression.  Initial Review & Psychosocial Screening: Initial Psych Review & Screening - 12/01/17 1308      Initial Review   Current issues with  None Identified      Family Dynamics   Good Support System?  Yes    Comments  Recently loss of brother - MI      Barriers   Psychosocial barriers to participate in program  The patient should benefit from training in stress management and relaxation.      Screening Interventions   Interventions  Encouraged to exercise    Expected Outcomes  Short Term goal: Identification and review with participant of any Quality of Life or Depression concerns found by scoring the  questionnaire.;Long Term goal: The participant improves quality of Life and PHQ9 Scores as seen by post scores and/or verbalization of changes       Quality of Life Scores: Quality of Life - 12/01/17 1151      Quality of Life Scores   Health/Function Pre  25.2 %    Socioeconomic Pre  29.29 %    Psych/Spiritual Pre  30 %    Family Pre  30 %    GLOBAL Pre  27.67 %      Scores of 19 and below usually indicate a poorer quality of life in these areas.  A difference of  2-3 points is a clinically meaningful difference.  A difference of 2-3 points in the total score of the Quality of Life Index has been associated with significant improvement in overall quality of life, [name]-image, physical symptoms, and general health in studies assessing change in quality of life.  PHQ-9: Recent Review Flowsheet Data    Depression screen [facility] 2/9 12/06/2017 10/03/2017 08/26/2017 02/26/2014 01/17/2014   Decreased Interest 0 0 0 0 0   Down, Depressed, Hopeless 0 0 0 0 0   PHQ - 2 Score 0 0 0 0 0     Interpretation of Total Score  Total Score Depression Severity:  1-4 = Minimal depression, 5-9 = Mild depression, 10-14 = Moderate depression, 15-19 = Moderately severe depression, 20-27 = Severe depression   Psychosocial Evaluation and Intervention:   Psychosocial Re-Evaluation: Psychosocial Re-Evaluation    Row Name 12/15/17 1330 01/12/18 0748           Psychosocial Re-Evaluation   Current issues with  None Identified  None Identified      Comments  -  No psychosocial needs identified. No intervention necessary.       Expected Outcomes  -  Pt will continue to have a positive outlook with good coping skills.       Interventions  Encouraged to attend Cardiac Rehabilitation for the exercise;Stress management education  Encouraged to attend Cardiac Rehabilitation for the exercise;Stress management education      Continue Psychosocial Services   No Follow up required  No Follow up required          Psychosocial Discharge (Final Psychosocial Re-Evaluation): Psychosocial Re-Evaluation - 01/12/18 0748      Psychosocial Re-Evaluation   Current issues with  None Identified    Comments  No psychosocial needs identified. No intervention necessary.     Expected Outcomes  Pt will continue to have a positive outlook with good coping skills.     Interventions  Encouraged to attend Cardiac Rehabilitation for the exercise;Stress management education    Continue Psychosocial Services   No Follow up required       Vocational Rehabilitation: Provide vocational rehab assistance to qualifying candidates.   Vocational Rehab Evaluation & Intervention: Vocational Rehab - 12/01/17 1311      Initial Vocational Rehab Evaluation & Intervention   Assessment shows need for Vocational Rehabilitation  No Pt is retired       Education: Education Goals: Education classes will be provided on a weekly basis, covering required topics. Participant will state understanding/return demonstration of topics presented.  Learning Barriers/Preferences: Learning Barriers/Preferences - 12/01/17 1150      Learning Barriers/Preferences   Learning Barriers  Sight    Learning Preferences  Verbal Instruction;Video;Written Material;Skilled Demonstration       Education Topics: Count Your Pulse:  -Group instruction provided by verbal instruction, demonstration, patient participation and written materials to support subject.  Instructors address importance of being able to find your pulse and how to count your pulse when at home without a heart monitor.  Patients get hands on experience counting their pulse with staff help and individually.   CARDIAC REHAB PHASE II EXERCISE from 01/11/2018 in [place]  Date  01/06/18  Instruction Review Code  1- Verbalizes Understanding      Heart Attack, Angina, and Risk Factor Modification:  -Group instruction provided by verbal instruction,  video, and written materials to support subject.  Instructors address signs and symptoms of angina and heart attacks.    Also discuss risk factors for heart disease and how to make changes to improve heart health risk factors.   Functional Fitness:  -Group instruction provided by verbal instruction, demonstration, patient participation, and written materials to support subject.  Instructors address safety measures for doing things around the house.  Discuss how to get up and down off the floor, how to pick things up properly, how to safely get out of a chair without assistance, and balance training.   Meditation and Mindfulness:  -Group instruction provided by verbal instruction, patient participation, and written materials to support subject.  Instructor addresses importance of mindfulness and meditation practice to help reduce stress and improve awareness.  Instructor also leads participants through a meditation exercise.    CARDIAC REHAB PHASE II EXERCISE from 01/11/2018 in [facility]  Date  12/14/17  Educator  Jeanella Craze  Instruction Review Code  2- Demonstrated Understanding      Stretching for Flexibility and Mobility:  -Group instruction provided by verbal instruction, patient participation, and written materials to support subject.  Instructors lead participants through series of stretches that are designed to increase flexibility thus improving mobility.  These  stretches are additional exercise for major muscle groups that are typically performed during regular warm up and cool down.   Hands Only CPR:  -Group verbal, video, and participation provides a basic overview of AHA guidelines for community CPR. Role-play of emergencies allow participants the opportunity to practice calling for help and chest compression technique with discussion of AED use.   Hypertension: -Group verbal and written instruction that provides a basic overview of hypertension  including the most recent diagnostic guidelines, risk factor reduction with [name]-care instructions and medication management.    Nutrition I class: Heart Healthy Eating:  -Group instruction provided by PowerPoint slides, verbal discussion, and written materials to support subject matter. The instructor gives an explanation and review of the Therapeutic Lifestyle Changes diet recommendations, which includes a discussion on lipid goals, dietary fat, sodium, fiber, plant stanol/sterol esters, sugar, and the components of a well-balanced, healthy diet.   CARDIAC REHAB PHASE II EXERCISE from 01/11/2018 in [place]  Date  12/19/17  Educator  RD      Nutrition II class: Lifestyle Skills:  -Group instruction provided by PowerPoint slides, verbal discussion, and written materials to support subject matter. The instructor gives an explanation and review of label reading, grocery shopping for heart health, heart healthy recipe modifications, and ways to make healthier choices when eating out.   CARDIAC REHAB PHASE II EXERCISE from 01/11/2018 in [place]  Date  12/19/17  Educator  RD      Diabetes Question & Answer:  -Group instruction provided by PowerPoint slides, verbal discussion, and written materials to support subject matter. The instructor gives an explanation and review of diabetes co-morbidities, pre- and post-prandial blood glucose goals, pre-exercise blood glucose goals, signs, symptoms, and treatment of hypoglycemia and hyperglycemia, and foot care basics.   CARDIAC REHAB PHASE II EXERCISE from 01/11/2018 in [place]  Date  12/30/17  Educator  RD  Instruction Review Code  2- Demonstrated Understanding      Diabetes Blitz:  -Group instruction provided by PowerPoint slides, verbal discussion, and written materials to support subject matter. The instructor gives an explanation and review of  the physiology behind type 1 and type 2 diabetes, diabetes medications and rational behind using different medications, pre- and post-prandial blood glucose recommendations and Hemoglobin A1c goals, diabetes diet, and exercise including blood glucose guidelines for exercising safely.    CARDIAC REHAB PHASE II EXERCISE from 01/11/2018 in [place]  Date  12/19/17  Educator  RD      Portion Distortion:  -Group instruction provided by PowerPoint slides, verbal discussion, written materials, and food models to support subject matter. The instructor gives an explanation of serving size versus portion size, changes in portions sizes over the last 20 years, and what consists of a serving from each food group.   CARDIAC REHAB PHASE II EXERCISE from 01/11/2018 in [place]  Date  12/21/17  Educator  RD  Instruction Review Code  2- Demonstrated Understanding      Stress Management:  -Group instruction provided by verbal instruction, video, and written materials to support subject matter.  Instructors review role of stress in heart disease and how to cope with stress positively.     CARDIAC REHAB PHASE II EXERCISE from 01/11/2018 in [place]  Date  12/28/17  Instruction Review Code  2- Demonstrated Understanding  Exercising on Your Own:  -Group instruction provided by verbal instruction, power point, and written materials to support subject.  Instructors discuss benefits of exercise, components of exercise, frequency and intensity of exercise, and end points for exercise.  Also discuss use of nitroglycerin and activating EMS.  Review options of places to exercise outside of rehab.  Review guidelines for sex with heart disease.   Cardiac Drugs I:  -Group instruction provided by verbal instruction and written materials to support subject.  Instructor reviews cardiac drug classes: antiplatelets,  anticoagulants, beta blockers, and statins.  Instructor discusses reasons, side effects, and lifestyle considerations for each drug class.   Cardiac Drugs II:  -Group instruction provided by verbal instruction and written materials to support subject.  Instructor reviews cardiac drug classes: angiotensin converting enzyme inhibitors (ACE-I), angiotensin II receptor blockers (ARBs), nitrates, and calcium channel blockers.  Instructor discusses reasons, side effects, and lifestyle considerations for each drug class.   CARDIAC REHAB PHASE II EXERCISE from 01/11/2018 in [place]  Date  01/04/18  Instruction Review Code  2- Demonstrated Understanding      Anatomy and Physiology of the Circulatory System:  Group verbal and written instruction and models provide basic cardiac anatomy and physiology, with the coronary electrical and arterial systems. Review of: AMI, Angina, Valve disease, Heart Failure, Peripheral Artery Disease, Cardiac Arrhythmia, Pacemakers, and the ICD.   CARDIAC REHAB PHASE II EXERCISE from 01/11/2018 in [place]  Date  01/11/18  Educator  Barnet Pall  Instruction Review Code  2- Demonstrated Understanding      Other Education:  -Group or individual verbal, written, or video instructions that support the educational goals of the cardiac rehab program.   CARDIAC REHAB PHASE II EXERCISE from 01/11/2018 in [place]  Date  12/07/17  Educator  [facility] video  Instruction Review Code  2- Demonstrated Understanding      Holiday Eating Survival Tips:  -Group instruction provided by PowerPoint slides, verbal discussion, and written materials to support subject matter. The instructor gives patients tips, tricks, and techniques to help them not only survive but enjoy the holidays despite the onslaught of food that accompanies the holidays.   Knowledge Questionnaire Score: Knowledge  Questionnaire Score - 12/01/17 1151      Knowledge Questionnaire Score   Pre Score  22/24       Core Components/Risk Factors/Patient Goals at Admission: Personal Goals and Risk Factors at Admission - 12/01/17 1138      Core Components/Risk Factors/Patient Goals on Admission    Weight Management  Yes;Obesity    Intervention  Weight Management: Develop a combined nutrition and exercise program designed to reach desired caloric intake, while maintaining appropriate intake of nutrient and fiber, sodium and fats, and appropriate energy expenditure required for the weight goal.;Weight Management: Provide education and appropriate resources to help participant work on and attain dietary goals.;Weight Management/Obesity: Establish reasonable short term and long term weight goals.;Obesity: Provide education and appropriate resources to help participant work on and attain dietary goals.    Admit Weight  272 lb 14.9 oz (123.8 kg)    Goal Weight: Short Term  265 lb (120.2 kg)    Goal Weight: Long Term  257 lb (116.6 kg)    Expected Outcomes  Short Term: Continue to assess and modify interventions until short term weight is achieved;Long Term: Adherence to nutrition and physical activity/exercise program aimed toward attainment of established weight goal;Weight Loss:  Understanding of general recommendations for a balanced deficit meal plan, which promotes 1-2 lb weight loss per week and includes a negative energy balance of [phone] kcal/d;Understanding recommendations for meals to include 15-35% energy as protein, 25-35% energy from fat, 35-60% energy from carbohydrates, less than 200mg  of dietary cholesterol, 20-35 gm of total fiber daily;Understanding of distribution of calorie intake throughout the day with the consumption of 4-5 meals/snacks    Diabetes  Yes    Intervention  Provide education about signs/symptoms and action to take for hypo/hyperglycemia.;Provide education about proper nutrition, including  hydration, and aerobic/resistive exercise prescription along with prescribed medications to achieve blood glucose in normal ranges: Fasting glucose 65-99 mg/dL    Expected Outcomes  Short Term: Participant verbalizes understanding of the signs/symptoms and immediate care of hyper/hypoglycemia, proper foot care and importance of medication, aerobic/resistive exercise and nutrition plan for blood glucose control.;Long Term: Attainment of HbA1C < 7%.    Hypertension  Yes    Intervention  Monitor prescription use compliance.;Provide education on lifestyle modifcations including regular physical activity/exercise, weight management, moderate sodium restriction and increased consumption of fresh fruit, vegetables, and low fat dairy, alcohol moderation, and smoking cessation.    Expected Outcomes  Long Term: Maintenance of blood pressure at goal levels.;Short Term: Continued assessment and intervention until BP is < 140/40mm HG in hypertensive participants. < 130/39mm HG in hypertensive participants with diabetes, heart failure or chronic kidney disease.    Lipids  Yes    Intervention  Provide education and support for participant on nutrition & aerobic/resistive exercise along with prescribed medications to achieve LDL 70mg , HDL >40mg .    Expected Outcomes  Short Term: Participant states understanding of desired cholesterol values and is compliant with medications prescribed. Participant is following exercise prescription and nutrition guidelines.;Long Term: Cholesterol controlled with medications as prescribed, with individualized exercise RX and with personalized nutrition plan. Value goals: LDL < 70mg , HDL > 40 mg.       Core Components/Risk Factors/Patient Goals Review:  Goals and Risk Factor Review    Row Name 12/15/17 1324 01/12/18 0746           Core Components/Risk Factors/Patient Goals Review   Personal Goals Review  Weight Management/Obesity;Lipids;Hypertension;Diabetes  Weight  Management/Obesity;Lipids;Hypertension;Diabetes      Review  - Vassie is off to a good start to exercise. Due to Elaiza's decondtioning and inability to walk long distances. Carolynn is only exercising on the nustep at this time  Pt with multiple CAD RF. Pt continues to exercise despite mobility limitations.  She attends nutrtion and RF modification opportunities.       Expected Outcomes  Keeley will continue to take her medications as presribed and come to exercise at cardiac rehab for exercise.  Bethania will continue to participate in CR Program and RF modification opportunities.          Core Components/Risk Factors/Patient Goals at Discharge (Final Review):  Goals and Risk Factor Review - 01/12/18 0746      Core Components/Risk Factors/Patient Goals Review   Personal Goals Review  Weight Management/Obesity;Lipids;Hypertension;Diabetes    Review  Pt with multiple CAD RF. Pt continues to exercise despite mobility limitations.  She attends nutrtion and RF modification opportunities.     Expected Outcomes  Delena will continue to participate in CR Program and RF modification opportunities.        ITP Comments: ITP Comments    Row Name 12/01/17 1138 12/15/17 1322 01/12/18 0745       ITP Comments  Dr. Fransico Him, Medical Director   30 day ITP Review. Patient with good attendance and participation in phase 2 cardiac rehab. Kirstin is off to a good slow start to exercise.  30 Day ITP Review. Pt continues to attend CR Program.  Although Jullianna's mobility limits her at times, she is motivated to exercise.         Comments: See ITP Comments.

## 2018-01-13 ENCOUNTER — Encounter (HOSPITAL_COMMUNITY)
Admission: RE | Admit: 2018-01-13 | Discharge: 2018-01-13 | Disposition: A | Discharge status: Home / Self Care | Payer: Medicare HMO | Source: Ambulatory Visit | Attending: Cardiovascular Disease | Admitting: Cardiovascular Disease

## 2018-01-13 DIAGNOSIS — Z48812 Encounter for surgical aftercare following surgery on the circulatory system: Secondary | ICD-10-CM | POA: Diagnosis not present

## 2018-01-13 DIAGNOSIS — Z951 Presence of aortocoronary bypass graft: Secondary | ICD-10-CM

## 2018-01-16 ENCOUNTER — Encounter (HOSPITAL_COMMUNITY)
Admission: RE | Admit: 2018-01-16 | Discharge: 2018-01-16 | Disposition: A | Discharge status: Home / Self Care | Payer: Medicare HMO | Source: Ambulatory Visit | Attending: Cardiovascular Disease | Admitting: Cardiovascular Disease

## 2018-01-16 DIAGNOSIS — Z951 Presence of aortocoronary bypass graft: Secondary | ICD-10-CM

## 2018-01-16 DIAGNOSIS — Z48812 Encounter for surgical aftercare following surgery on the circulatory system: Secondary | ICD-10-CM | POA: Diagnosis not present

## 2018-01-18 ENCOUNTER — Encounter (HOSPITAL_COMMUNITY)
Admission: RE | Admit: 2018-01-18 | Discharge: 2018-01-18 | Disposition: A | Discharge status: Home / Self Care | Payer: Medicare HMO | Source: Ambulatory Visit | Attending: Cardiovascular Disease | Admitting: Cardiovascular Disease

## 2018-01-18 DIAGNOSIS — Z48812 Encounter for surgical aftercare following surgery on the circulatory system: Secondary | ICD-10-CM | POA: Diagnosis not present

## 2018-01-18 DIAGNOSIS — Z951 Presence of aortocoronary bypass graft: Secondary | ICD-10-CM | POA: Diagnosis not present

## 2018-01-19 DIAGNOSIS — E1065 Type 1 diabetes mellitus with hyperglycemia: Secondary | ICD-10-CM | POA: Diagnosis not present

## 2018-01-19 DIAGNOSIS — E782 Mixed hyperlipidemia: Secondary | ICD-10-CM | POA: Diagnosis not present

## 2018-01-20 ENCOUNTER — Encounter (HOSPITAL_COMMUNITY): Admission: RE | Admit: 2018-01-20 | Payer: Medicare HMO | Source: Ambulatory Visit

## 2018-01-23 ENCOUNTER — Encounter (HOSPITAL_COMMUNITY)
Admission: RE | Admit: 2018-01-23 | Discharge: 2018-01-23 | Disposition: A | Discharge status: Home / Self Care | Payer: Medicare HMO | Source: Ambulatory Visit | Attending: Cardiovascular Disease | Admitting: Cardiovascular Disease

## 2018-01-23 DIAGNOSIS — Z951 Presence of aortocoronary bypass graft: Secondary | ICD-10-CM

## 2018-01-23 DIAGNOSIS — Z48812 Encounter for surgical aftercare following surgery on the circulatory system: Secondary | ICD-10-CM | POA: Diagnosis not present

## 2018-01-25 ENCOUNTER — Encounter (HOSPITAL_COMMUNITY)
Admission: RE | Admit: 2018-01-25 | Discharge: 2018-01-25 | Disposition: A | Discharge status: Home / Self Care | Payer: Medicare HMO | Source: Ambulatory Visit | Attending: Cardiovascular Disease | Admitting: Cardiovascular Disease

## 2018-01-25 DIAGNOSIS — Z48812 Encounter for surgical aftercare following surgery on the circulatory system: Secondary | ICD-10-CM | POA: Insufficient documentation

## 2018-01-25 DIAGNOSIS — Z951 Presence of aortocoronary bypass graft: Secondary | ICD-10-CM | POA: Diagnosis not present

## 2018-01-26 DIAGNOSIS — I10 Essential (primary) hypertension: Secondary | ICD-10-CM | POA: Diagnosis not present

## 2018-01-26 DIAGNOSIS — E782 Mixed hyperlipidemia: Secondary | ICD-10-CM | POA: Diagnosis not present

## 2018-01-26 DIAGNOSIS — E1065 Type 1 diabetes mellitus with hyperglycemia: Secondary | ICD-10-CM | POA: Diagnosis not present

## 2018-01-27 ENCOUNTER — Encounter (HOSPITAL_COMMUNITY)
Admission: RE | Admit: 2018-01-27 | Discharge: 2018-01-27 | Disposition: A | Discharge status: Home / Self Care | Payer: Medicare HMO | Source: Ambulatory Visit | Attending: Cardiovascular Disease | Admitting: Cardiovascular Disease

## 2018-01-27 DIAGNOSIS — Z951 Presence of aortocoronary bypass graft: Secondary | ICD-10-CM | POA: Diagnosis not present

## 2018-01-27 DIAGNOSIS — Z48812 Encounter for surgical aftercare following surgery on the circulatory system: Secondary | ICD-10-CM | POA: Diagnosis not present

## 2018-01-30 ENCOUNTER — Encounter (HOSPITAL_COMMUNITY)
Admission: RE | Admit: 2018-01-30 | Discharge: 2018-01-30 | Disposition: A | Discharge status: Home / Self Care | Payer: Medicare HMO | Source: Ambulatory Visit | Attending: Cardiovascular Disease | Admitting: Cardiovascular Disease

## 2018-01-30 DIAGNOSIS — Z951 Presence of aortocoronary bypass graft: Secondary | ICD-10-CM | POA: Diagnosis not present

## 2018-01-30 DIAGNOSIS — Z48812 Encounter for surgical aftercare following surgery on the circulatory system: Secondary | ICD-10-CM | POA: Diagnosis not present

## 2018-02-01 ENCOUNTER — Encounter (HOSPITAL_COMMUNITY)
Admission: RE | Admit: 2018-02-01 | Discharge: 2018-02-01 | Disposition: A | Discharge status: Home / Self Care | Payer: Medicare HMO | Source: Ambulatory Visit | Attending: Cardiovascular Disease | Admitting: Cardiovascular Disease

## 2018-02-01 DIAGNOSIS — Z951 Presence of aortocoronary bypass graft: Secondary | ICD-10-CM | POA: Diagnosis not present

## 2018-02-01 DIAGNOSIS — Z48812 Encounter for surgical aftercare following surgery on the circulatory system: Secondary | ICD-10-CM | POA: Diagnosis not present

## 2018-02-03 ENCOUNTER — Encounter (HOSPITAL_COMMUNITY)
Admission: RE | Admit: 2018-02-03 | Discharge: 2018-02-03 | Disposition: A | Discharge status: Home / Self Care | Payer: Medicare HMO | Source: Ambulatory Visit | Attending: Cardiovascular Disease | Admitting: Cardiovascular Disease

## 2018-02-03 DIAGNOSIS — Z951 Presence of aortocoronary bypass graft: Secondary | ICD-10-CM

## 2018-02-03 DIAGNOSIS — Z48812 Encounter for surgical aftercare following surgery on the circulatory system: Secondary | ICD-10-CM | POA: Diagnosis not present

## 2018-02-06 ENCOUNTER — Encounter (HOSPITAL_COMMUNITY): Payer: Medicare HMO

## 2018-02-08 ENCOUNTER — Encounter (HOSPITAL_COMMUNITY)
Admission: RE | Admit: 2018-02-08 | Discharge: 2018-02-08 | Disposition: A | Discharge status: Home / Self Care | Payer: Medicare HMO | Source: Ambulatory Visit | Attending: Cardiovascular Disease | Admitting: Cardiovascular Disease

## 2018-02-08 DIAGNOSIS — Z48812 Encounter for surgical aftercare following surgery on the circulatory system: Secondary | ICD-10-CM | POA: Diagnosis not present

## 2018-02-08 DIAGNOSIS — Z951 Presence of aortocoronary bypass graft: Secondary | ICD-10-CM

## 2018-02-09 ENCOUNTER — Encounter (HOSPITAL_COMMUNITY): Payer: Self-pay

## 2018-02-09 NOTE — Progress Notes (Signed)
Cardiac Individual Treatment Plan  Patient Details  Name: [name] MRN: [id number] Date of Birth: [birth year] Referring Provider:     CARDIAC REHAB PHASE II ORIENTATION from 12/01/2017 in [facility]  Referring Provider  Croitoru, Dani Gobble MD      Initial Encounter Date:    Long from 12/01/2017 in [place]  Date  12/01/17  Referring Provider  Croitoru, Mihai MD      Visit Diagnosis: S/P CABG x 1  Patient's Home Medications on Admission:  Current Outpatient Medications:  .  aspirin EC 81 MG tablet, Take 81 mg by mouth daily., Disp: , Rfl:  .  furosemide (LASIX) 40 MG tablet, Take 1 tablet (40 mg total) by mouth daily. (Patient taking differently: Take 40 mg by mouth 3 (three) times a week. Take 40 mg Monday, Wednesday, and Friday each week.), Disp: 90 tablet, Rfl: 3 .  glimepiride (AMARYL) 2 MG tablet, Take 2 mg by mouth daily with breakfast., Disp: , Rfl:  .  insulin NPH-regular Human (NOVOLIN 70/30) (70-30) 100 UNIT/ML injection, Inject 30-34 Units into the skin 2 (two) times daily with a meal. Prior to surgery, she took 42 units in the morning 20 units in the evening. As of 08/23/2017, she is on 20 units in am and 20 units in the pm. Her Insulin gradually needs to be increased to pre surgery doses as glucose levels allow. (Patient taking differently: Inject 20 Units into the skin 2 (two) times daily before a meal. ), Disp: 10 mL, Rfl: 11 .  lisinopril-hydrochlorothiazide (PRINZIDE,ZESTORETIC) 20-25 MG per tablet, Take 1 tablet by mouth daily., Disp: , Rfl:  .  metoprolol tartrate (LOPRESSOR) 25 MG tablet, Take 1 tablet (25 mg total) by mouth 3 (three) times daily., Disp: 60 tablet, Rfl: 11 .  pantoprazole (PROTONIX) 40 MG tablet, TAKE 1 TABLET BY MOUTH EVERY DAY (Patient taking differently: Take 40 mg by mouth once a day), Disp: 90 tablet, Rfl: 2 .  potassium chloride (MICRO-K) 10 MEQ CR  capsule, Take 1 capsule (10 mEq total) by mouth daily., Disp: 30 capsule, Rfl: 6 .  simvastatin (ZOCOR) 40 MG tablet, TAKE 1 TABLET BY MOUTH EVERYDAY AT BEDTIME, Disp: 90 tablet, Rfl: 0 .  Vitamin D, Cholecalciferol, 1000 units TABS, Take 1,000 Units by mouth every morning. , Disp: , Rfl:  .  warfarin (COUMADIN) 7.5 MG tablet, Take 7.5 mg by mouth daily. 7.5 mg in the evening on Sun/Tues/Thurs/Sat and 3.75 mg on Mon/Wed/Fri, Disp: , Rfl:  .  warfarin (COUMADIN) 7.5 MG tablet, TAKE 1/2 TO 1 TABLET DAILY AS DIRECTED BY COUMADIN CLINIC, Disp: 90 tablet, Rfl: 0  Past Medical History: Past Medical History:  Diagnosis Date  . Arthritis    "knees" (02/05/2016)  . Cancer ([place]) 5/10   [facility]   . Coronary artery disease   . GERD (gastroesophageal reflux disease)    tx meds  . H/O hiatal hernia   . History of blood transfusion "several"   last april 2015 s/p knee surgery (02/05/2016)  . Hypercholesteremia   . Hypertension   . Lung cancer ([place])    2010, surgery 18% left lung no radiation or chemo  . Non-STEMI (non-ST elevated myocardial infarction) ([place]) 04/07/2012   See cath results below  . Paroxysmal atrial fibrillation ([place]) 04/07/2012   On Warfarin  . Peripheral vascular disease ([place]) 8/12   Lt SFA PTA  . Presence of stent in LAD  coronary artery 04/07/2012   Xience Expedition DES 2.75 mm x 18 mm (dilated to 3.0 mm)  . Sleep apnea    does not wear CPAP  . Type II diabetes mellitus ([facility])    insulin dependent    Tobacco Use: Social History   Tobacco Use  Smoking Status Former Smoker  . Packs/day: 1.00  . Years: 20.00  . Pack years: 20.00  . Types: Cigarettes  . Last attempt to quit: 09/27/1984  . Years since quitting: 33.3  Smokeless Tobacco Never Used    Labs: Recent Chemical engineer    Labs for ITP Cardiac and Pulmonary Rehab Latest Ref Rng & Units 08/17/2017 08/17/2017 08/17/2017 08/17/2017 08/17/2017   Cholestrol 0 - 200 mg/dL - - - - -   LDLCALC 0 - 99 mg/dL - - - -  -   LDLDIRECT mg/dL - - - - -   HDL >40 mg/dL - - - - -   Trlycerides <150 mg/dL - - - - -   Hemoglobin A1c 4.8 - 5.6 % - - - - -   PHART 7.350 - 7.450 7.520(H) 7.426 7.422 7.363 -   PCO2ART 32.0 - 48.0 mmHg 32.9 35.6 34.8 41.2 -   HCO3 20.0 - 28.0 mmol/L 26.9 24.0 22.9 23.7 -   TCO2 22 - 32 mmol/L _0 ACIDBASEDEF 0.0 - 2.0 mmol/L - 1.0 2.0 2.0 -   O2SAT % 100.0 99.0 100.0 100.0 -      Capillary Blood Glucose: Lab Results  Component Value Date   GLUCAP 198 (H) 12/14/2017   GLUCAP 181 (H) 12/14/2017   GLUCAP 142 (H) 12/12/2017   GLUCAP 147 (H) 12/12/2017   GLUCAP 214 (H) 12/09/2017     Exercise Target Goals:    Exercise Program Goal: Individual exercise prescription set using results from initial 6 min walk test and THRR while considering  patient's activity barriers and safety.   Exercise Prescription Goal: Initial exercise prescription builds to 30-45 minutes a day of aerobic activity, 2-3 days per week.  Home exercise guidelines will be given to patient during program as part of exercise prescription that the participant will acknowledge.  Activity Barriers & Risk Stratification: Activity Barriers & Cardiac Risk Stratification - 12/01/17 1149      Activity Barriers & Cardiac Risk Stratification   Activity Barriers  Arthritis;Joint Problems;Muscular Weakness;Deconditioning;Left Knee Replacement;Other (comment)    Comments  Severe Knee Pain and Severe Deconditioning     Cardiac Risk Stratification  High       6 Minute Walk: 6 Minute Walk    Row Name 12/01/17 1141         6 Minute Walk   Phase  Initial     Distance  151 feet     Walk Time  2.25 minutes     # of Rest Breaks  1     MPH  0.29     METS  -0.27     RPE  15     Perceived Dyspnea   3     VO2 Peak  -0.94     Symptoms  Yes (comment)     Comments  WheelChair and Gait Belt was used to assist pt with walk. Pt walked for a total of 2 mins and 25 secs. Pt took 1 rest break for 35 seconds. Pt  rated dyspnea 3/4 and had fatigued.      Resting HR  68 bpm     Resting BP  108/62     Resting Oxygen Saturation   99 %     Exercise Oxygen Saturation  during 6 min walk  100 %     Max Ex. HR  96 bpm     Max Ex. BP  118/64     2 Minute Post BP  116/58        Oxygen Initial Assessment:   Oxygen Re-Evaluation:   Oxygen Discharge (Final Oxygen Re-Evaluation):   Initial Exercise Prescription: Initial Exercise Prescription - 12/01/17 1100      Date of Initial Exercise RX and Referring Provider   Date  12/01/17    Referring Provider  Croitoru, Mihai MD      NuStep   Level  1    SPM  70    Minutes  30    METs  1      Prescription Details   Frequency (times per week)  3x    Duration  Progress to 30 minutes of continuous aerobic without signs/symptoms of physical distress      Intensity   THRR 40-80% of Max Heartrate  61-122    Ratings of Perceived Exertion  11-13    Perceived Dyspnea  0-4      Progression   Progression  Continue progressive overload as per policy without signs/symptoms or physical distress.      Resistance Training   Training Prescription  Yes    Weight  2lbs    Reps  10-15       Perform Capillary Blood Glucose checks as needed.  Exercise Prescription Changes: Exercise Prescription Changes    Row Name 12/07/17 1440 12/14/17 1600 12/28/17 0926 01/09/18 1712 02/01/18 0730     Response to Exercise   Blood Pressure (Admit)  114/80  118/62  132/78  122/60  122/70   Blood Pressure (Exercise)  122/70  122/84  140/80  128/70  128/72   Blood Pressure (Exit)  122/60  120/68  124/68  128/62  118/60   Heart Rate (Admit)  74 bpm  94 bpm  75 bpm  71 bpm  65 bpm   Heart Rate (Exercise)  87 bpm  109 bpm  91 bpm  87 bpm  78 bpm   Heart Rate (Exit)  74 bpm  90 bpm  75 bpm  71 bpm  62 bpm   Rating of Perceived Exertion (Exercise)  _0 Perceived Dyspnea (Exercise)  0  0  0  0  0   Symptoms  None  -  None  None   None   Comments  Pt oriented to  exercise equipment   -  -  -  -   Duration  Progress to 30 minutes of  aerobic without signs/symptoms of physical distress  Continue with 30 min of aerobic exercise without signs/symptoms of physical distress.  Continue with 30 min of aerobic exercise without signs/symptoms of physical distress.  Continue with 30 min of aerobic exercise without signs/symptoms of physical distress.  Continue with 30 min of aerobic exercise without signs/symptoms of physical distress.   Intensity  THRR New  THRR unchanged  THRR unchanged  THRR unchanged  THRR unchanged     Progression   Progression  Continue to progress workloads to maintain intensity without signs/symptoms of physical distress.  Continue to progress workloads to maintain intensity without signs/symptoms of physical distress.  Continue to progress workloads to maintain intensity without signs/symptoms of physical distress.  Continue  to progress workloads to maintain intensity without signs/symptoms of physical distress.  Continue to progress workloads to maintain intensity without signs/symptoms of physical distress.   Average METs  1.8  1.5  1.9  1.8  1.8     Resistance Training   Training Prescription  No  Yes  No  Yes  No   Weight  -  2lbs  -  3lbs  -   Reps  -  10-15  -  10-15  -   Time  -  10 Minutes  -  10 Minutes  -     Interval Training   Interval Training  No  No  No  No  No     NuStep   Level  _0 SPM  70  75  80  80  80   Minutes  _1 METs  1.8  1.5  1.9  1.8  1.8     Home Exercise Plan   Plans to continue exercise at  -  -  Home (comment) Chair Exercises  Home (comment) Chair Exercises   Home (comment) Chair Exercises   Frequency  -  -  Add 2 additional days to program exercise sessions.  Add 2 additional days to program exercise sessions.  Add 2 additional days to program exercise sessions.   Initial Home Exercises Provided  -  -  12/26/17  12/26/17  12/26/17   Row Name 02/08/18 1352              Response to Exercise   Blood Pressure (Admit)  120/70       Blood Pressure (Exercise)  114/60       Blood Pressure (Exit)  106/60       Heart Rate (Admit)  77 bpm       Heart Rate (Exercise)  80 bpm       Heart Rate (Exit)  61 bpm       Rating of Perceived Exertion (Exercise)  12       Perceived Dyspnea (Exercise)  0       Symptoms  None       Duration  Continue with 30 min of aerobic exercise without signs/symptoms of physical distress.       Intensity  THRR unchanged         Progression   Progression  Continue to progress workloads to maintain intensity without signs/symptoms of physical distress.       Average METs  1.8         Resistance Training   Training Prescription  No         Interval Training   Interval Training  No         NuStep   Level  3       SPM  85       Minutes  30       METs  1.8         Home Exercise Plan   Plans to continue exercise at  Home (comment) Chair Exercises       Frequency  Add 2 additional days to program exercise sessions.       Initial Home Exercises Provided  12/26/17          Exercise Comments: Exercise Comments    Row Name 12/07/17 1442 12/14/17 1639 12/26/17 1635 01/11/18 1714 02/09/18 1354   Exercise  Comments  Pt off to a good start with exercise. Pt was oriented to exercise equipment.   Pt is tolerating exercise workloads depsite severe limitations due to knee problems. Will continue to work with pt on improving functional fitness and improving stamina.  Reviewed HEP with pt. Pt encouraged start chair exercises 2-3x a week. Will continue to monitor and follow pt.   Reviewed METs and Goals with pt. Pt has major orthopedic limitations and chronic knee pain. Despite these limitations, pt put forth effort with exercise and does what she can. Will continue to work with pt to increase average on Nustep.   Reviewed METs and Goals with pt. Pt is still averaging 1.8 METs on Nustep, but functional mobility has greatly increased. Will continue  to work with pt to increase strength and stamina.       Exercise Goals and Review: Exercise Goals    Row Name 12/01/17 1155             Exercise Goals   Increase Physical Activity  Yes       Intervention  Provide advice, education, support and counseling about physical activity/exercise needs.;Develop an individualized exercise prescription for aerobic and resistive training based on initial evaluation findings, risk stratification, comorbidities and participant's personal goals.       Expected Outcomes  Short Term: Attend rehab on a regular basis to increase amount of physical activity.;Long Term: Add in home exercise to make exercise part of routine and to increase amount of physical activity.;Long Term: Exercising regularly at least 3-5 days a week.       Increase Strength and Stamina  Yes       Intervention  Provide advice, education, support and counseling about physical activity/exercise needs.;Develop an individualized exercise prescription for aerobic and resistive training based on initial evaluation findings, risk stratification, comorbidities and participant's personal goals.       Expected Outcomes  Short Term: Increase workloads from initial exercise prescription for resistance, speed, and METs.;Short Term: Perform resistance training exercises routinely during rehab and add in resistance training at home;Long Term: Improve cardiorespiratory fitness, muscular endurance and strength as measured by increased METs and functional capacity (6MWT)       Able to understand and use rate of perceived exertion (RPE) scale  Yes       Intervention  Provide education and explanation on how to use RPE scale       Expected Outcomes  Short Term: Able to use RPE daily in rehab to express subjective intensity level;Long Term:  Able to use RPE to guide intensity level when exercising independently       Able to understand and use Dyspnea scale  Yes       Intervention  Provide education and  explanation on how to use Dyspnea scale       Expected Outcomes  Short Term: Able to use Dyspnea scale daily in rehab to express subjective sense of shortness of breath during exertion;Long Term: Able to use Dyspnea scale to guide intensity level when exercising independently       Knowledge and understanding of Target Heart Rate Range (THRR)  Yes       Intervention  Provide education and explanation of THRR including how the numbers were predicted and where they are located for reference       Expected Outcomes  Short Term: Able to state/look up THRR;Short Term: Able to use daily as guideline for intensity in rehab;Long Term: Able to use THRR to govern  intensity when exercising independently       Able to check pulse independently  Yes       Intervention  Provide education and demonstration on how to check pulse in carotid and radial arteries.;Review the importance of being able to check your own pulse for safety during independent exercise       Expected Outcomes  Short Term: Able to explain why pulse checking is important during independent exercise;Long Term: Able to check pulse independently and accurately       Understanding of Exercise Prescription  Yes       Intervention  Provide education, explanation, and written materials on patient's individual exercise prescription       Expected Outcomes  Short Term: Able to explain program exercise prescription;Long Term: Able to explain home exercise prescription to exercise independently          Exercise Goals Re-Evaluation : Exercise Goals Re-Evaluation    Row Name 12/14/17 1644 12/26/17 1637 01/11/18 1715 02/09/18 1356       Exercise Goal Re-Evaluation   Exercise Goals Review  Increase Physical Activity;Able to understand and use rate of perceived exertion (RPE) scale;Understanding of Exercise Prescription;Knowledge and understanding of Target Heart Rate Range (THRR);Increase Strength and Stamina;Able to check pulse independently  Increase  Physical Activity;Able to understand and use rate of perceived exertion (RPE) scale;Knowledge and understanding of Target Heart Rate Range (THRR);Understanding of Exercise Prescription;Increase Strength and Stamina;Able to check pulse independently  Increase Physical Activity;Increase Strength and Stamina;Understanding of Exercise Prescription  Increase Physical Activity;Increase Strength and Stamina;Understanding of Exercise Prescription    Comments  Pt is severely limited by chronic knee pain and deconditioned. However, pt is consistenly coming to exercise at rehab and puts forth great effort. Will continue to work with pt to improve strength and stamina. Pt is tolerating workloads well and is able to exercise on Nustep for 30 minutes with minimal difficullty.   Reviewed HEP with pt. Reviewed RPE Scale, THRR, endpoints of exercises, weather conditions, importance of warmup and cool down.   Despite pt's limitations with knees, pt states the exercise is helping her. Pt has increased to 3lbs weights. Pt is still averaging about 1.8 METs. Will work with pt to try and improve that to 2.0 METs.   Pt has not shown an increase in MET level, but is showing that exercise is helping with her functional mobility. Pt is now able to drive herself to rehab regain her independence since increasing her strength. Will work with pt to continue to progress.     Expected Outcomes  Will follow up with pt about plans for exercise at home. Will suggest to pt chair exercises.  Pt will continue to increase cardiorespiratory fitness. Will continue to monitor and progress pt as tolerated.   Pt will begin to do chair exercises 2-3x a week in addition to exericse at Cardiac Rehab. Pt will continue to monitor and follow up with pt.  Pt has been doing chair exercises 2 days a week in addition to cardiac rehab. Will continue to improve cardiovascular strength and funtional mobility.   Pt will continue to do chair exercises 2 days a week. Pt is  considering participating in water aerobics at [facility] again through [employer]. This was highly suggested due to pt's major orthopedic limitations. Will continue to monitor and follow up with pt.         Discharge Exercise Prescription (Final Exercise Prescription Changes): Exercise Prescription Changes - 02/08/18 1352  Response to Exercise   Blood Pressure (Admit)  120/70    Blood Pressure (Exercise)  114/60    Blood Pressure (Exit)  106/60    Heart Rate (Admit)  77 bpm    Heart Rate (Exercise)  80 bpm    Heart Rate (Exit)  61 bpm    Rating of Perceived Exertion (Exercise)  12    Perceived Dyspnea (Exercise)  0    Symptoms  None    Duration  Continue with 30 min of aerobic exercise without signs/symptoms of physical distress.    Intensity  THRR unchanged      Progression   Progression  Continue to progress workloads to maintain intensity without signs/symptoms of physical distress.    Average METs  1.8      Resistance Training   Training Prescription  No      Interval Training   Interval Training  No      NuStep   Level  3    SPM  85    Minutes  30    METs  1.8      Home Exercise Plan   Plans to continue exercise at  Home (comment) Chair Exercises    Frequency  Add 2 additional days to program exercise sessions.    Initial Home Exercises Provided  12/26/17       Nutrition:  Target Goals: Understanding of nutrition guidelines, daily intake of sodium <1546m, cholesterol <201m calories 30% from fat and 7% or less from saturated fats, daily to have 5 or more servings of fruits and vegetables.  Biometrics: Pre Biometrics - 12/01/17 1150      Pre Biometrics   Height  _0  (1.6 m)    Weight  272 lb 14.9 oz (123.8 kg)    Waist Circumference  48.5 inches    Hip Circumference  59 inches    Waist to Hip Ratio  0.82 %    BMI (Calculated)  48.36    Triceps Skinfold  25 mm    % Body Fat  54.8 %    Grip Strength  24 kg    Flexibility  0 in    Single  Leg Stand  0 seconds        Nutrition Therapy Plan and Nutrition Goals: Nutrition Therapy & Goals - 12/01/17 1346      Nutrition Therapy   Diet  Carb Modified, Heart Healthy    Drug/Food Interactions  Coumadin/Vit K      Personal Nutrition Goals   Nutrition Goal  Pt to identify and limit food sources of saturated fat, trans fat, and sodium    Personal Goal #2  Pt to identify food quantities necessary to achieve weight loss of 6-24 lb (2.7-10.9 kg) at graduation from cardiac rehab.    Personal Goal #3  Improved blood glucose control as evidenced by pt's A1c trending from 7.9 toward less than 7.0.      Intervention Plan   Intervention  Prescribe, educate and counsel regarding individualized specific dietary modifications aiming towards targeted core components such as weight, hypertension, lipid management, diabetes, heart failure and other comorbidities.    Expected Outcomes  Short Term Goal: Understand basic principles of dietary content, such as calories, fat, sodium, cholesterol and nutrients.;Long Term Goal: Adherence to prescribed nutrition plan.       Nutrition Assessments: Nutrition Assessments - 12/01/17 1346      MEDFICTS Scores   Pre Score  93       Nutrition Goals Re-Evaluation:  Nutrition Goals Re-Evaluation:   Nutrition Goals Discharge (Final Nutrition Goals Re-Evaluation):   Psychosocial: Target Goals: Acknowledge presence or absence of significant depression and/or stress, maximize coping skills, provide positive support system. Participant is able to verbalize types and ability to use techniques and skills needed for reducing stress and depression.  Initial Review & Psychosocial Screening: Initial Psych Review & Screening - 12/01/17 1308      Initial Review   Current issues with  None Identified      Family Dynamics   Good Support System?  Yes    Comments  Recently loss of brother - [place]      Barriers   Psychosocial barriers to participate in  program  The patient should benefit from training in stress management and relaxation.      Screening Interventions   Interventions  Encouraged to exercise    Expected Outcomes  Short Term goal: Identification and review with participant of any Quality of Life or Depression concerns found by scoring the questionnaire.;Long Term goal: The participant improves quality of Life and PHQ9 Scores as seen by post scores and/or verbalization of changes       Quality of Life Scores: Quality of Life - 12/01/17 1151      Quality of Life Scores   Health/Function Pre  25.2 %    Socioeconomic Pre  29.29 %    Psych/Spiritual Pre  30 %    Family Pre  30 %    GLOBAL Pre  27.67 %      Scores of 19 and below usually indicate a poorer quality of life in these areas.  A difference of  2-3 points is a clinically meaningful difference.  A difference of 2-3 points in the total score of the Quality of Life Index has been associated with significant improvement in overall quality of life, [name]-image, physical symptoms, and general health in studies assessing change in quality of life.  PHQ-9: Recent Review Flowsheet Data    Depression screen [facility] 2/9 12/06/2017 10/03/2017 08/26/2017 02/26/2014 01/17/2014   Decreased Interest 0 0 0 0 0   Down, Depressed, Hopeless 0 0 0 0 0   PHQ - 2 Score 0 0 0 0 0     Interpretation of Total Score  Total Score Depression Severity:  1-4 = Minimal depression, 5-9 = Mild depression, 10-14 = Moderate depression, 15-19 = Moderately severe depression, 20-27 = Severe depression   Psychosocial Evaluation and Intervention:   Psychosocial Re-Evaluation: Psychosocial Re-Evaluation    [place] Name 12/15/17 1330 01/12/18 0748 02/09/18 1415         Psychosocial Re-Evaluation   Current issues with  None Identified  None Identified  None Identified     Comments  -  No psychosocial needs identified. No intervention necessary.   No psychosocial needs identified. No intervention necessary.       Expected Outcomes  -  Pt will continue to have a positive outlook with good coping skills.   Pt will continue to have a positive outlook with good coping skills.      Interventions  Encouraged to attend Cardiac Rehabilitation for the exercise;Stress management education  Encouraged to attend Cardiac Rehabilitation for the exercise;Stress management education  Encouraged to attend Cardiac Rehabilitation for the exercise;Stress management education     Continue Psychosocial Services   No Follow up required  No Follow up required  No Follow up required        Psychosocial Discharge (Final Psychosocial Re-Evaluation): Psychosocial Re-Evaluation - 02/09/18  1415      Psychosocial Re-Evaluation   Current issues with  None Identified    Comments  No psychosocial needs identified. No intervention necessary.     Expected Outcomes  Pt will continue to have a positive outlook with good coping skills.     Interventions  Encouraged to attend Cardiac Rehabilitation for the exercise;Stress management education    Continue Psychosocial Services   No Follow up required       Vocational Rehabilitation: Provide vocational rehab assistance to qualifying candidates.   Vocational Rehab Evaluation & Intervention: Vocational Rehab - 12/01/17 1311      Initial Vocational Rehab Evaluation & Intervention   Assessment shows need for Vocational Rehabilitation  No Pt is retired       Education: Education Goals: Education classes will be provided on a weekly basis, covering required topics. Participant will state understanding/return demonstration of topics presented.  Learning Barriers/Preferences: Learning Barriers/Preferences - 12/01/17 1150      Learning Barriers/Preferences   Learning Barriers  Sight    Learning Preferences  Verbal Instruction;Video;Written Material;Skilled Demonstration       Education Topics: Count Your Pulse:  -Group instruction provided by verbal instruction, demonstration,  patient participation and written materials to support subject.  Instructors address importance of being able to find your pulse and how to count your pulse when at home without a heart monitor.  Patients get hands on experience counting their pulse with staff help and individually.   CARDIAC REHAB PHASE II EXERCISE from 02/08/2018 in [place]  Date  01/06/18  Instruction Review Code  1- Verbalizes Understanding      Heart Attack, Angina, and Risk Factor Modification:  -Group instruction provided by verbal instruction, video, and written materials to support subject.  Instructors address signs and symptoms of angina and heart attacks.    Also discuss risk factors for heart disease and how to make changes to improve heart health risk factors.   CARDIAC REHAB PHASE II EXERCISE from 02/08/2018 in [place]  Date  01/18/18  Instruction Review Code  2- Demonstrated Understanding      Functional Fitness:  -Group instruction provided by verbal instruction, demonstration, patient participation, and written materials to support subject.  Instructors address safety measures for doing things around the house.  Discuss how to get up and down off the floor, how to pick things up properly, how to safely get out of a chair without assistance, and balance training.   Meditation and Mindfulness:  -Group instruction provided by verbal instruction, patient participation, and written materials to support subject.  Instructor addresses importance of mindfulness and meditation practice to help reduce stress and improve awareness.  Instructor also leads participants through a meditation exercise.    CARDIAC REHAB PHASE II EXERCISE from 02/08/2018 in [place]  Date  02/08/18  Educator  Jeanella Craze  Instruction Review Code  2- Demonstrated Understanding      Stretching for Flexibility and Mobility:  -Group  instruction provided by verbal instruction, patient participation, and written materials to support subject.  Instructors lead participants through series of stretches that are designed to increase flexibility thus improving mobility.  These stretches are additional exercise for major muscle groups that are typically performed during regular warm up and cool down.   Hands Only CPR:  -Group verbal, video, and participation provides a basic overview of AHA guidelines for community CPR. Role-play of emergencies allow participants the  opportunity to practice calling for help and chest compression technique with discussion of AED use.   Hypertension: -Group verbal and written instruction that provides a basic overview of hypertension including the most recent diagnostic guidelines, risk factor reduction with [name]-care instructions and medication management.    Nutrition I class: Heart Healthy Eating:  -Group instruction provided by PowerPoint slides, verbal discussion, and written materials to support subject matter. The instructor gives an explanation and review of the Therapeutic Lifestyle Changes diet recommendations, which includes a discussion on lipid goals, dietary fat, sodium, fiber, plant stanol/sterol esters, sugar, and the components of a well-balanced, healthy diet.   CARDIAC REHAB PHASE II EXERCISE from 02/08/2018 in [place]  Date  12/19/17  Educator  RD      Nutrition II class: Lifestyle Skills:  -Group instruction provided by PowerPoint slides, verbal discussion, and written materials to support subject matter. The instructor gives an explanation and review of label reading, grocery shopping for heart health, heart healthy recipe modifications, and ways to make healthier choices when eating out.   CARDIAC REHAB PHASE II EXERCISE from 02/08/2018 in [place]  Date  12/19/17  Educator  RD      Diabetes Question &  Answer:  -Group instruction provided by PowerPoint slides, verbal discussion, and written materials to support subject matter. The instructor gives an explanation and review of diabetes co-morbidities, pre- and post-prandial blood glucose goals, pre-exercise blood glucose goals, signs, symptoms, and treatment of hypoglycemia and hyperglycemia, and foot care basics.   CARDIAC REHAB PHASE II EXERCISE from 02/08/2018 in [place]  Date  02/03/18  Educator  RD  Instruction Review Code  2- Demonstrated Understanding      Diabetes Blitz:  -Group instruction provided by PowerPoint slides, verbal discussion, and written materials to support subject matter. The instructor gives an explanation and review of the physiology behind type 1 and type 2 diabetes, diabetes medications and rational behind using different medications, pre- and post-prandial blood glucose recommendations and Hemoglobin A1c goals, diabetes diet, and exercise including blood glucose guidelines for exercising safely.    CARDIAC REHAB PHASE II EXERCISE from 02/08/2018 in [place]  Date  12/19/17  Educator  RD      Portion Distortion:  -Group instruction provided by PowerPoint slides, verbal discussion, written materials, and food models to support subject matter. The instructor gives an explanation of serving size versus portion size, changes in portions sizes over the last 20 years, and what consists of a serving from each food group.   CARDIAC REHAB PHASE II EXERCISE from 02/08/2018 in [place]  Date  12/21/17  Educator  RD  Instruction Review Code  2- Demonstrated Understanding      Stress Management:  -Group instruction provided by verbal instruction, video, and written materials to support subject matter.  Instructors review role of stress in heart disease and how to cope with stress positively.     CARDIAC REHAB PHASE II  EXERCISE from 02/08/2018 in [place]  Date  12/28/17  Instruction Review Code  2- Demonstrated Understanding      Exercising on Your Own:  -Group instruction provided by verbal instruction, power point, and written materials to support subject.  Instructors discuss benefits of exercise, components of exercise, frequency and intensity of exercise, and end points for exercise.  Also discuss use of nitroglycerin and activating  EMS.  Review options of places to exercise outside of rehab.  Review guidelines for sex with heart disease.   Cardiac Drugs I:  -Group instruction provided by verbal instruction and written materials to support subject.  Instructor reviews cardiac drug classes: antiplatelets, anticoagulants, beta blockers, and statins.  Instructor discusses reasons, side effects, and lifestyle considerations for each drug class.   CARDIAC REHAB PHASE II EXERCISE from 02/08/2018 in [place]  Date  02/01/18  Instruction Review Code  2- Demonstrated Understanding      Cardiac Drugs II:  -Group instruction provided by verbal instruction and written materials to support subject.  Instructor reviews cardiac drug classes: angiotensin converting enzyme inhibitors (ACE-I), angiotensin II receptor blockers (ARBs), nitrates, and calcium channel blockers.  Instructor discusses reasons, side effects, and lifestyle considerations for each drug class.   CARDIAC REHAB PHASE II EXERCISE from 02/08/2018 in [place]  Date  01/04/18  Instruction Review Code  2- Demonstrated Understanding      Anatomy and Physiology of the Circulatory System:  Group verbal and written instruction and models provide basic cardiac anatomy and physiology, with the coronary electrical and arterial systems. Review of: AMI, Angina, Valve disease, Heart Failure, Peripheral Artery Disease, Cardiac Arrhythmia, Pacemakers, and the  ICD.   CARDIAC REHAB PHASE II EXERCISE from 02/08/2018 in [place]  Date  01/11/18  Educator  Barnet Pall  Instruction Review Code  2- Demonstrated Understanding      Other Education:  -Group or individual verbal, written, or video instructions that support the educational goals of the cardiac rehab program.   CARDIAC REHAB PHASE II EXERCISE from 02/08/2018 in [place]  Date  12/07/17  Educator  CV[facility] video  Instruction Review Code  2- Demonstrated Understanding      Holiday Eating Survival Tips:  -Group instruction provided by PowerPoint slides, verbal discussion, and written materials to support subject matter. The instructor gives patients tips, tricks, and techniques to help them not only survive but enjoy the holidays despite the onslaught of food that accompanies the holidays.   Knowledge Questionnaire Score: Knowledge Questionnaire Score - 12/01/17 1151      Knowledge Questionnaire Score   Pre Score  22/24       Core Components/Risk Factors/Patient Goals at Admission: Personal Goals and Risk Factors at Admission - 12/01/17 1138      Core Components/Risk Factors/Patient Goals on Admission    Weight Management  Yes;Obesity    Intervention  Weight Management: Develop a combined nutrition and exercise program designed to reach desired caloric intake, while maintaining appropriate intake of nutrient and fiber, sodium and fats, and appropriate energy expenditure required for the weight goal.;Weight Management: Provide education and appropriate resources to help participant work on and attain dietary goals.;Weight Management/Obesity: Establish reasonable short term and long term weight goals.;Obesity: Provide education and appropriate resources to help participant work on and attain dietary goals.    Admit Weight  272 lb 14.9 oz (123.8 kg)    Goal Weight: Short Term  265 lb (120.2 kg)    Goal Weight: Long Term   257 lb (116.6 kg)    Expected Outcomes  Short Term: Continue to assess and modify interventions until short term weight is achieved;Long Term: Adherence to nutrition and physical activity/exercise program aimed toward attainment of established weight goal;Weight Loss: Understanding of general recommendations for a balanced deficit meal plan, which promotes 1-2 lb weight  loss per week and includes a negative energy balance of [phone] kcal/d;Understanding recommendations for meals to include 15-35% energy as protein, 25-35% energy from fat, 35-60% energy from carbohydrates, less than 221m of dietary cholesterol, 20-35 gm of total fiber daily;Understanding of distribution of calorie intake throughout the day with the consumption of 4-5 meals/snacks    Diabetes  Yes    Intervention  Provide education about signs/symptoms and action to take for hypo/hyperglycemia.;Provide education about proper nutrition, including hydration, and aerobic/resistive exercise prescription along with prescribed medications to achieve blood glucose in normal ranges: Fasting glucose 65-99 mg/dL    Expected Outcomes  Short Term: Participant verbalizes understanding of the signs/symptoms and immediate care of hyper/hypoglycemia, proper foot care and importance of medication, aerobic/resistive exercise and nutrition plan for blood glucose control.;Long Term: Attainment of HbA1C < 7%.    Hypertension  Yes    Intervention  Monitor prescription use compliance.;Provide education on lifestyle modifcations including regular physical activity/exercise, weight management, moderate sodium restriction and increased consumption of fresh fruit, vegetables, and low fat dairy, alcohol moderation, and smoking cessation.    Expected Outcomes  Long Term: Maintenance of blood pressure at goal levels.;Short Term: Continued assessment and intervention until BP is < 140/985mHG in hypertensive participants. < 130/8011mG in hypertensive participants with  diabetes, heart failure or chronic kidney disease.    Lipids  Yes    Intervention  Provide education and support for participant on nutrition & aerobic/resistive exercise along with prescribed medications to achieve LDL <85m38mDL >40mg85m Expected Outcomes  Short Term: Participant states understanding of desired cholesterol values and is compliant with medications prescribed. Participant is following exercise prescription and nutrition guidelines.;Long Term: Cholesterol controlled with medications as prescribed, with individualized exercise RX and with personalized nutrition plan. Value goals: LDL < 85mg,80m > 40 mg.       Core Components/Risk Factors/Patient Goals Review:  Goals and Risk Factor Review    Row Name 12/15/17 1324 01/12/18 0746 02/09/18 1414         Core Components/Risk Factors/Patient Goals Review   Personal Goals Review  Weight Management/Obesity;Lipids;Hypertension;Diabetes  Weight Management/Obesity;Lipids;Hypertension;Diabetes  Weight Management/Obesity;Lipids;Hypertension;Diabetes     Review  - Shanin Rakelf to a good start to exercise. Due to Taisia's decondtioning and inability to walk long distances. Fanchon Shawnettaly exercising on the nustep at this time  Pt with multiple CAD RF. Pt continues to exercise despite mobility limitations.  She attends nutrtion and RF modification opportunities.   Pt with multiple CAD RF. Pt continues to exercise despite mobility limitations.  She has great attendance for exercise and educational opportunities.  Mazella's vitals have remained stable.      Expected Outcomes  Shannyn will continue to take her medications as presribed and come to exercise at cardiac rehab for exercise.  Maybelle Saylorcontinue to participate in CR Program and RF modification opportunities.   Nayellie Lillyonacontinue to participate in CR Program and RF modification opportunities.         Core Components/Risk Factors/Patient Goals at Discharge (Final Review):  Goals and Risk Factor  Review - 02/09/18 1414      Core Components/Risk Factors/Patient Goals Review   Personal Goals Review  Weight Management/Obesity;Lipids;Hypertension;Diabetes    Review  Pt with multiple CAD RF. Pt continues to exercise despite mobility limitations.  She has great attendance for exercise and educational opportunities.  Ayline's vitals have remained stable.     Expected Outcomes  Shellie will continue to participate  in CR Program and RF modification opportunities.        ITP Comments: ITP Comments    Row Name 12/01/17 1138 12/15/17 1322 01/12/18 0745 02/09/18 1413     ITP Comments  Dr. Fransico Him, Medical Director   30 day ITP Review. Patient with good attendance and participation in phase 2 cardiac rehab. Takeria is off to a good slow start to exercise.  30 Day ITP Review. Pt continues to attend CR Program.  Although Myriam's mobility limits her at times, she is motivated to exercise.   30 Day ITP Review. Daylee's mobility continues to limit her, but she is motivated to exercise. She has remained consistent with her average MET level.        Comments: See ITP Comments.

## 2018-02-10 ENCOUNTER — Encounter (HOSPITAL_COMMUNITY)
Admission: RE | Admit: 2018-02-10 | Discharge: 2018-02-10 | Disposition: A | Discharge status: Home / Self Care | Payer: Medicare HMO | Source: Ambulatory Visit | Attending: Cardiovascular Disease | Admitting: Cardiovascular Disease

## 2018-02-10 DIAGNOSIS — Z48812 Encounter for surgical aftercare following surgery on the circulatory system: Secondary | ICD-10-CM | POA: Diagnosis not present

## 2018-02-10 DIAGNOSIS — Z951 Presence of aortocoronary bypass graft: Secondary | ICD-10-CM | POA: Diagnosis not present

## 2018-02-13 ENCOUNTER — Encounter (HOSPITAL_COMMUNITY)
Admission: RE | Admit: 2018-02-13 | Discharge: 2018-02-13 | Disposition: A | Discharge status: Home / Self Care | Payer: Medicare HMO | Source: Ambulatory Visit | Attending: Cardiovascular Disease | Admitting: Cardiovascular Disease

## 2018-02-13 DIAGNOSIS — Z951 Presence of aortocoronary bypass graft: Secondary | ICD-10-CM

## 2018-02-13 DIAGNOSIS — Z48812 Encounter for surgical aftercare following surgery on the circulatory system: Secondary | ICD-10-CM | POA: Diagnosis not present

## 2018-02-14 ENCOUNTER — Other Ambulatory Visit: Payer: Self-pay | Admitting: *Deleted

## 2018-02-14 ENCOUNTER — Encounter: Payer: Self-pay | Admitting: *Deleted

## 2018-02-14 NOTE — Patient Outreach (Signed)
[facility]) Care Management  02/14/2018  [name] Dec 13, 1950 [id number]   RN Health Coach Monthly Outreach  Referral Date:  11/23/2017 Referral Source:  Transfer from [facility] Nurse Reason for Referral:  Continued Disease Management Education Insurance:  [facility] Medicare   Outreach Attempt:  Successful telephone outreach to patient for monthly follow up. HIPAA verified with patient.  Patient states she is doing ok.  A little discouraged with Hgb A1C not decreasing.  A1C maintained at 8.4 on 01/19/2018.  Reports her morning dose of insulin was increased and she can see a reduction in her fasting morning blood sugars.  This mornings blood sugar was 146 with fasting ranges of 80-140's.  Patient reports about 2 hypoglycemic episodes in the last few weeks.  Denies any sick days.  Appointments:  Patient reports seeing primary care provider on 01/26/2018 and will have follow up appointment in August 2019.  Verbalizes she still needs to arrange this follow up appointment.  Patient encouraged to arrange appointment.  Plan:  RN Health Coach will make next monthly outreach to patient in the month of June.   [place] [phone] [name]

## 2018-02-15 ENCOUNTER — Encounter (HOSPITAL_COMMUNITY)
Admission: RE | Admit: 2018-02-15 | Discharge: 2018-02-15 | Disposition: A | Discharge status: Home / Self Care | Payer: Medicare HMO | Source: Ambulatory Visit | Attending: Cardiovascular Disease | Admitting: Cardiovascular Disease

## 2018-02-15 DIAGNOSIS — Z48812 Encounter for surgical aftercare following surgery on the circulatory system: Secondary | ICD-10-CM | POA: Diagnosis not present

## 2018-02-15 DIAGNOSIS — Z951 Presence of aortocoronary bypass graft: Secondary | ICD-10-CM

## 2018-02-17 ENCOUNTER — Encounter (HOSPITAL_COMMUNITY)
Admission: RE | Admit: 2018-02-17 | Discharge: 2018-02-17 | Disposition: A | Discharge status: Home / Self Care | Payer: Medicare HMO | Source: Ambulatory Visit | Attending: Cardiovascular Disease | Admitting: Cardiovascular Disease

## 2018-02-17 ENCOUNTER — Ambulatory Visit (INDEPENDENT_AMBULATORY_CARE_PROVIDER_SITE_OTHER): Payer: Medicare HMO | Admitting: Pharmacist Clinician (PhC)/ Clinical Pharmacy Specialist

## 2018-02-17 DIAGNOSIS — Z48812 Encounter for surgical aftercare following surgery on the circulatory system: Secondary | ICD-10-CM | POA: Diagnosis not present

## 2018-02-17 DIAGNOSIS — Z7901 Long term (current) use of anticoagulants: Secondary | ICD-10-CM | POA: Diagnosis not present

## 2018-02-17 DIAGNOSIS — Z951 Presence of aortocoronary bypass graft: Secondary | ICD-10-CM | POA: Diagnosis not present

## 2018-02-17 DIAGNOSIS — I480 Paroxysmal atrial fibrillation: Secondary | ICD-10-CM

## 2018-02-17 LAB — POCT INR: INR: 2.9 (ref 2.0–3.0)

## 2018-02-21 ENCOUNTER — Other Ambulatory Visit: Payer: Self-pay | Admitting: Cardiovascular Disease

## 2018-02-22 ENCOUNTER — Encounter (HOSPITAL_COMMUNITY)
Admission: RE | Admit: 2018-02-22 | Discharge: 2018-02-22 | Disposition: A | Discharge status: Home / Self Care | Payer: Medicare HMO | Source: Ambulatory Visit | Attending: Cardiovascular Disease | Admitting: Cardiovascular Disease

## 2018-02-22 DIAGNOSIS — Z951 Presence of aortocoronary bypass graft: Secondary | ICD-10-CM

## 2018-02-22 DIAGNOSIS — Z48812 Encounter for surgical aftercare following surgery on the circulatory system: Secondary | ICD-10-CM | POA: Diagnosis not present

## 2018-02-24 ENCOUNTER — Encounter (HOSPITAL_COMMUNITY)
Admission: RE | Admit: 2018-02-24 | Discharge: 2018-02-24 | Disposition: A | Discharge status: Home / Self Care | Payer: Medicare HMO | Source: Ambulatory Visit | Attending: Cardiovascular Disease | Admitting: Cardiovascular Disease

## 2018-02-24 DIAGNOSIS — Z951 Presence of aortocoronary bypass graft: Secondary | ICD-10-CM

## 2018-02-24 DIAGNOSIS — Z48812 Encounter for surgical aftercare following surgery on the circulatory system: Secondary | ICD-10-CM | POA: Diagnosis not present

## 2018-02-27 ENCOUNTER — Encounter (HOSPITAL_COMMUNITY)
Admission: RE | Admit: 2018-02-27 | Discharge: 2018-02-27 | Disposition: A | Discharge status: Home / Self Care | Payer: Medicare HMO | Source: Ambulatory Visit | Attending: Cardiovascular Disease | Admitting: Cardiovascular Disease

## 2018-02-27 DIAGNOSIS — Z48812 Encounter for surgical aftercare following surgery on the circulatory system: Secondary | ICD-10-CM | POA: Insufficient documentation

## 2018-02-27 DIAGNOSIS — Z951 Presence of aortocoronary bypass graft: Secondary | ICD-10-CM | POA: Diagnosis not present

## 2018-03-01 ENCOUNTER — Encounter (HOSPITAL_COMMUNITY): Payer: Medicare HMO

## 2018-03-03 ENCOUNTER — Encounter (HOSPITAL_COMMUNITY)
Admission: RE | Admit: 2018-03-03 | Discharge: 2018-03-03 | Disposition: A | Discharge status: Home / Self Care | Payer: Medicare HMO | Source: Ambulatory Visit | Attending: Cardiovascular Disease | Admitting: Cardiovascular Disease

## 2018-03-03 DIAGNOSIS — Z951 Presence of aortocoronary bypass graft: Secondary | ICD-10-CM | POA: Diagnosis not present

## 2018-03-03 DIAGNOSIS — Z48812 Encounter for surgical aftercare following surgery on the circulatory system: Secondary | ICD-10-CM | POA: Diagnosis not present

## 2018-03-06 ENCOUNTER — Encounter (HOSPITAL_COMMUNITY): Payer: Self-pay

## 2018-03-06 ENCOUNTER — Encounter (HOSPITAL_COMMUNITY)
Admission: RE | Admit: 2018-03-06 | Discharge: 2018-03-06 | Disposition: A | Discharge status: Home / Self Care | Payer: Medicare HMO | Source: Ambulatory Visit | Attending: Cardiovascular Disease | Admitting: Cardiovascular Disease

## 2018-03-06 VITALS — Ht 63.0 in | Wt 277.1 lb

## 2018-03-06 DIAGNOSIS — Z48812 Encounter for surgical aftercare following surgery on the circulatory system: Secondary | ICD-10-CM | POA: Diagnosis not present

## 2018-03-06 DIAGNOSIS — Z951 Presence of aortocoronary bypass graft: Secondary | ICD-10-CM | POA: Diagnosis not present

## 2018-03-07 DIAGNOSIS — E1065 Type 1 diabetes mellitus with hyperglycemia: Secondary | ICD-10-CM | POA: Diagnosis not present

## 2018-03-08 ENCOUNTER — Encounter (HOSPITAL_COMMUNITY): Payer: Medicare HMO

## 2018-03-09 ENCOUNTER — Ambulatory Visit: Payer: Self-pay | Admitting: *Deleted

## 2018-03-10 ENCOUNTER — Other Ambulatory Visit: Payer: Self-pay | Admitting: *Deleted

## 2018-03-10 NOTE — Patient Outreach (Signed)
[facility] [facility]) Care Management  03/10/2018  [name] Dec 31, 1950 [id number]   RN Health Coach Monthly Outreach  Referral Date:11/23/2017 Referral Source:Transfer from [facility] Nurse Reason for Referral:Continued Disease Management Education Insurance:[employer]   Outreach Attempt:  Outreach attempt #1 to patient for monthly follow up. No answer. RN Health Coach left HIPAA compliant voicemail message along with contact information.  Plan:  RN Health Coach will send unsuccessful outreach letter to patient.  RN Health Coach will make another outreach attempt to patient within 3-4 business days if no return call back from patient.  [name] [phone] [name]

## 2018-03-13 ENCOUNTER — Other Ambulatory Visit: Payer: Self-pay | Admitting: Internal Medicine

## 2018-03-13 DIAGNOSIS — Z1231 Encounter for screening mammogram for malignant neoplasm of breast: Secondary | ICD-10-CM

## 2018-03-14 ENCOUNTER — Other Ambulatory Visit: Payer: Self-pay | Admitting: Gastroenterology

## 2018-03-14 DIAGNOSIS — R1310 Dysphagia, unspecified: Secondary | ICD-10-CM

## 2018-03-14 DIAGNOSIS — Z86010 Personal history of colonic polyps: Secondary | ICD-10-CM | POA: Diagnosis not present

## 2018-03-14 DIAGNOSIS — K219 Gastro-esophageal reflux disease without esophagitis: Secondary | ICD-10-CM | POA: Diagnosis not present

## 2018-03-15 ENCOUNTER — Other Ambulatory Visit: Payer: Self-pay | Admitting: *Deleted

## 2018-03-15 NOTE — Progress Notes (Signed)
Discharge Progress Report  Patient Details  Name: [name] MRN: [id number] Date of Birth: Feb 01, 1951 Referring Provider:     CARDIAC REHAB PHASE II ORIENTATION from 12/01/2017 in [place]  Referring Provider  Croitoru, Mihai MD       Number of Visits: 2  Reason for Discharge:  Patient reached a stable level of exercise. Patient has met program and personal goals.  Smoking History:  Social History   Tobacco Use  Smoking Status Former Smoker  . Packs/day: 1.00  . Years: 20.00  . Pack years: 20.00  . Types: Cigarettes  . Last attempt to quit: 09/27/1984  . Years since quitting: 33.4  Smokeless Tobacco Never Used    Diagnosis:  S/P CABG x 1  ADL UCSD:   Initial Exercise Prescription: Initial Exercise Prescription - 12/01/17 1100      Date of Initial Exercise RX and Referring Provider   Date  12/01/17    Referring Provider  Croitoru, Mihai MD      NuStep   Level  1    SPM  70    Minutes  30    METs  1      Prescription Details   Frequency (times per week)  3x    Duration  Progress to 30 minutes of continuous aerobic without signs/symptoms of physical distress      Intensity   THRR 40-80% of Max Heartrate  61-122    Ratings of Perceived Exertion  11-13    Perceived Dyspnea  0-4      Progression   Progression  Continue progressive overload as per policy without signs/symptoms or physical distress.      Resistance Training   Training Prescription  Yes    Weight  2lbs    Reps  10-15       Discharge Exercise Prescription (Final Exercise Prescription Changes): Exercise Prescription Changes - 03/06/18 1300      Response to Exercise   Blood Pressure (Admit)  114/72    Blood Pressure (Exercise)  124/60    Blood Pressure (Exit)  120/60    Heart Rate (Admit)  64 bpm    Heart Rate (Exercise)  97 bpm    Heart Rate (Exit)  64 bpm    Rating of Perceived Exertion (Exercise)  12    Perceived Dyspnea (Exercise)  0     Symptoms  None     Duration  Continue with 30 min of aerobic exercise without signs/symptoms of physical distress.    Intensity  THRR unchanged      Progression   Progression  Continue to progress workloads to maintain intensity without signs/symptoms of physical distress.    Average METs  1.9      Resistance Training   Training Prescription  Yes    Weight  3lbs    Reps  10-15    Time  10 Minutes      Interval Training   Interval Training  No      NuStep   Level  3    SPM  95    Minutes  15    METs  1.9      Track   Laps  2    Minutes  15    METs  1.35      Home Exercise Plan   Plans to continue exercise at  [employer] (comment)    Frequency  Add 3 additional days to program exercise sessions.    Initial  Home Exercises Provided  12/26/17       Functional Capacity: 6 Minute Walk    Row Name 12/01/17 1141 03/08/18 1445       6 Minute Walk   Phase  Initial  Discharge    Distance  151 feet  301 feet    Distance % Change  -  99.34 %    Distance Feet Change  -  150 ft    Walk Time  2.25 minutes  6 minutes    # of Rest Breaks  1  0    MPH  0.29  0.57    METS  -0.27  -0.2    RPE  15  12    Perceived Dyspnea   3  2    VO2 Peak  -0.94  -0.7    Symptoms  Yes (comment)  Yes (comment)    Comments  WheelChair and Gait Belt was used to assist pt with walk. Pt walked for a total of 2 mins and 25 secs. Pt took 1 rest break for 35 seconds. Pt rated dyspnea 3/4 and had fatigued.   Knee Pain +5, Moderate SOB +2, 1 minute rest break at 3 minutes.     Resting HR  68 bpm  66 bpm    Resting BP  108/62  112/60    Resting Oxygen Saturation   99 %  -    Exercise Oxygen Saturation  during 6 min walk  100 %  -    Max Ex. HR  96 bpm  97 bpm    Max Ex. BP  118/64  138/70    2 Minute Post BP  116/58  108/70       Psychological, QOL, Others - Outcomes: PHQ 2/9: Depression screen [facility] 2/9 03/06/2018 12/06/2017 10/03/2017 08/26/2017 02/26/2014  Decreased Interest 0 0 0 0 0  Down,  Depressed, Hopeless 0 0 0 0 0  PHQ - 2 Score 0 0 0 0 0  Some recent data might be hidden    Quality of Life: Quality of Life - 03/06/18 1643      Quality of Life Scores   Health/Function Pre  25.2 %    Health/Function Post  25.77 %    Health/Function % Change  2.26 %    Socioeconomic Pre  29.29 %    Socioeconomic Post  28.21 %    Socioeconomic % Change   -3.69 %    Psych/Spiritual Pre  30 %    Psych/Spiritual Post  28 %    Psych/Spiritual % Change  -6.67 %    Family Pre  30 %    Family Post  26.88 %    Family % Change  -10.4 %    GLOBAL Pre  27.67 %    GLOBAL Post  26.85 %    GLOBAL % Change  -2.96 %       Personal Goals: Goals established at orientation with interventions provided to work toward goal. Personal Goals and Risk Factors at Admission - 12/01/17 1138      Core Components/Risk Factors/Patient Goals on Admission    Weight Management  Yes;Obesity    Intervention  Weight Management: Develop a combined nutrition and exercise program designed to reach desired caloric intake, while maintaining appropriate intake of nutrient and fiber, sodium and fats, and appropriate energy expenditure required for the weight goal.;Weight Management: Provide education and appropriate resources to help participant work on and attain dietary goals.;Weight Management/Obesity: Establish reasonable short term and long term  weight goals.;Obesity: Provide education and appropriate resources to help participant work on and attain dietary goals.    Admit Weight  272 lb 14.9 oz (123.8 kg)    Goal Weight: Short Term  265 lb (120.2 kg)    Goal Weight: Long Term  257 lb (116.6 kg)    Expected Outcomes  Short Term: Continue to assess and modify interventions until short term weight is achieved;Long Term: Adherence to nutrition and physical activity/exercise program aimed toward attainment of established weight goal;Weight Loss: Understanding of general recommendations for a balanced deficit meal plan,  which promotes 1-2 lb weight loss per week and includes a negative energy balance of [phone] kcal/d;Understanding recommendations for meals to include 15-35% energy as protein, 25-35% energy from fat, 35-60% energy from carbohydrates, less than 240m of dietary cholesterol, 20-35 gm of total fiber daily;Understanding of distribution of calorie intake throughout the day with the consumption of 4-5 meals/snacks    Diabetes  Yes    Intervention  Provide education about signs/symptoms and action to take for hypo/hyperglycemia.;Provide education about proper nutrition, including hydration, and aerobic/resistive exercise prescription along with prescribed medications to achieve blood glucose in normal ranges: Fasting glucose 65-99 mg/dL    Expected Outcomes  Short Term: Participant verbalizes understanding of the signs/symptoms and immediate care of hyper/hypoglycemia, proper foot care and importance of medication, aerobic/resistive exercise and nutrition plan for blood glucose control.;Long Term: Attainment of HbA1C < 7%.    Hypertension  Yes    Intervention  Monitor prescription use compliance.;Provide education on lifestyle modifcations including regular physical activity/exercise, weight management, moderate sodium restriction and increased consumption of fresh fruit, vegetables, and low fat dairy, alcohol moderation, and smoking cessation.    Expected Outcomes  Long Term: Maintenance of blood pressure at goal levels.;Short Term: Continued assessment and intervention until BP is < 140/918mHG in hypertensive participants. < 130/8024mG in hypertensive participants with diabetes, heart failure or chronic kidney disease.    Lipids  Yes    Intervention  Provide education and support for participant on nutrition & aerobic/resistive exercise along with prescribed medications to achieve LDL <68m4mDL >40mg4m Expected Outcomes  Short Term: Participant states understanding of desired cholesterol values and is  compliant with medications prescribed. Participant is following exercise prescription and nutrition guidelines.;Long Term: Cholesterol controlled with medications as prescribed, with individualized exercise RX and with personalized nutrition plan. Value goals: LDL < 68mg,68m > 40 mg.        Personal Goals Discharge: Goals and Risk Factor Review    Row Name 12/15/17 1324 01/12/18 0746 02/09/18 1414 03/06/18 1412       Core Components/Risk Factors/Patient Goals Review   Personal Goals Review  Weight Management/Obesity;Lipids;Hypertension;Diabetes  Weight Management/Obesity;Lipids;Hypertension;Diabetes  Weight Management/Obesity;Lipids;Hypertension;Diabetes  Weight Management/Obesity;Lipids;Hypertension;Diabetes    Review  - Shallen Meilyf to a good start to exercise. Due to Xzandria's decondtioning and inability to walk long distances. Wandalene Latyshaly exercising on the nustep at this time  Pt with multiple CAD RF. Pt continues to exercise despite mobility limitations.  She attends nutrtion and RF modification opportunities.   Pt with multiple CAD RF. Pt continues to exercise despite mobility limitations.  She has great attendance for exercise and educational opportunities.  Exie's vitals have remained stable.   Pt with multiple CAD RF. Pt exercise ability has improved throughout the program that is visible with her ability to walk.  [facility]     Expected Outcomes  Lakashia will continue to take her medications as presribed and come to exercise at cardiac rehab for exercise.  Iolanda will continue to participate in CR Program and RF modification opportunities.   Sharee will continue to participate in CR Program and RF modification opportunities.   Virdia will continue to participate in exercise and RF modification opportunities.        Exercise Goals and Review: Exercise Goals    Row Name 12/01/17 1155             Exercise Goals   Increase  Physical Activity  Yes       Intervention  Provide advice, education, support and counseling about physical activity/exercise needs.;Develop an individualized exercise prescription for aerobic and resistive training based on initial evaluation findings, risk stratification, comorbidities and participant's personal goals.       Expected Outcomes  Short Term: Attend rehab on a regular basis to increase amount of physical activity.;Long Term: Add in home exercise to make exercise part of routine and to increase amount of physical activity.;Long Term: Exercising regularly at least 3-5 days a week.       Increase Strength and Stamina  Yes       Intervention  Provide advice, education, support and counseling about physical activity/exercise needs.;Develop an individualized exercise prescription for aerobic and resistive training based on initial evaluation findings, risk stratification, comorbidities and participant's personal goals.       Expected Outcomes  Short Term: Increase workloads from initial exercise prescription for resistance, speed, and METs.;Short Term: Perform resistance training exercises routinely during rehab and add in resistance training at home;Long Term: Improve cardiorespiratory fitness, muscular endurance and strength as measured by increased METs and functional capacity (6MWT)       Able to understand and use rate of perceived exertion (RPE) scale  Yes       Intervention  Provide education and explanation on how to use RPE scale       Expected Outcomes  Short Term: Able to use RPE daily in rehab to express subjective intensity level;Long Term:  Able to use RPE to guide intensity level when exercising independently       Able to understand and use Dyspnea scale  Yes       Intervention  Provide education and explanation on how to use Dyspnea scale       Expected Outcomes  Short Term: Able to use Dyspnea scale daily in rehab to express subjective sense of shortness of breath during  exertion;Long Term: Able to use Dyspnea scale to guide intensity level when exercising independently       Knowledge and understanding of Target Heart Rate Range (THRR)  Yes       Intervention  Provide education and explanation of THRR including how the numbers were predicted and where they are located for reference       Expected Outcomes  Short Term: Able to state/look up THRR;Short Term: Able to use daily as guideline for intensity in rehab;Long Term: Able to use THRR to govern intensity when exercising independently       Able to check pulse independently  Yes       Intervention  Provide education and demonstration on how to check pulse in carotid and radial arteries.;Review the importance of being able to check your own pulse for safety during independent exercise       Expected Outcomes  Short Term: Able to explain why pulse checking  is important during independent exercise;Long Term: Able to check pulse independently and accurately       Understanding of Exercise Prescription  Yes       Intervention  Provide education, explanation, and written materials on patient's individual exercise prescription       Expected Outcomes  Short Term: Able to explain program exercise prescription;Long Term: Able to explain home exercise prescription to exercise independently          Nutrition & Weight - Outcomes: Pre Biometrics - 12/01/17 1150      Pre Biometrics   Height  _0  (1.6 m)    Weight  272 lb 14.9 oz (123.8 kg)    Waist Circumference  48.5 inches    Hip Circumference  59 inches    Waist to Hip Ratio  0.82 %    BMI (Calculated)  48.36    Triceps Skinfold  25 mm    % Body Fat  54.8 %    Grip Strength  24 kg    Flexibility  0 in    Single Leg Stand  0 seconds      Post Biometrics - 03/06/18 1645       Post  Biometrics   Height  _1  (1.6 m)    Weight  277 lb 1.9 oz (125.7 kg)    Waist Circumference  48.5 inches    Hip Circumference  60 inches    Waist to Hip Ratio  0.81 %     BMI (Calculated)  49.1    Triceps Skinfold  26 mm    % Body Fat  55.4 %    Grip Strength  26 kg    Flexibility  0 in    Single Leg Stand  0 seconds       Nutrition: Nutrition Therapy & Goals - 12/01/17 1346      Nutrition Therapy   Diet  Carb Modified, Heart Healthy    Drug/Food Interactions  Coumadin/Vit K      Personal Nutrition Goals   Nutrition Goal  Pt to identify and limit food sources of saturated fat, trans fat, and sodium    Personal Goal #2  Pt to identify food quantities necessary to achieve weight loss of 6-24 lb (2.7-10.9 kg) at graduation from cardiac rehab.    Personal Goal #3  Improved blood glucose control as evidenced by pt's A1c trending from 7.9 toward less than 7.0.      Intervention Plan   Intervention  Prescribe, educate and counsel regarding individualized specific dietary modifications aiming towards targeted core components such as weight, hypertension, lipid management, diabetes, heart failure and other comorbidities.    Expected Outcomes  Short Term Goal: Understand basic principles of dietary content, such as calories, fat, sodium, cholesterol and nutrients.;Long Term Goal: Adherence to prescribed nutrition plan.       Nutrition Discharge: Nutrition Assessments - 03/10/18 1129      MEDFICTS Scores   Pre Score  93    Post Score  60    Score Difference  -33       Education Questionnaire Score: Knowledge Questionnaire Score - 03/06/18 1643      Knowledge Questionnaire Score   Pre Score  22/24    Post Score  24/24       Goals reviewed with patient; copy given to patient.

## 2018-03-15 NOTE — Patient Outreach (Signed)
[facility]) Care Management  03/15/2018  BEKAH IGOE [birth year] [id number]   RN Health Coach Monthly Outreach  Referral Date:11/23/2017 Referral Source:Transfer from [facility] Nurse Reason for Referral:Continued Disease Management Education Insurance:[employer]   Outreach Attempt:  Outreach attempt #2 to patient for monthly follow up. No answer and unable to leave voicemail message due to voicemail not picking up.  Plan:  RN Health Coach will make another outreach attempt to patient within 3-4 business days if no return call back from patient.  [place] [phone] [email or web address]

## 2018-03-17 ENCOUNTER — Other Ambulatory Visit: Payer: Self-pay | Admitting: *Deleted

## 2018-03-17 DIAGNOSIS — E1065 Type 1 diabetes mellitus with hyperglycemia: Secondary | ICD-10-CM | POA: Diagnosis not present

## 2018-03-17 NOTE — Patient Outreach (Signed)
[facility]) Care Management  03/17/2018  ANALYSE ANGST [birth year] [id number]   RN Health Coach Monthly Outreach  Referral Date:11/23/2017 Referral Source:Transfer from [facility] Nurse Reason for Referral:Continued Disease Management Education Insurance:[employer]   Outreach Attempt:  Outreach attempt #3 to patient for monthly follow up. No answer. RN Health Coach left HIPAA compliant voicemail message along with contact information.  Plan:  RN Health Coach will close case if no return call from patient within 10 day time period from Unsuccessful Letter being mailed to patient.  [place] [phone] [email or web address]

## 2018-03-21 DIAGNOSIS — H26493 Other secondary cataract, bilateral: Secondary | ICD-10-CM | POA: Diagnosis not present

## 2018-03-21 DIAGNOSIS — H52202 Unspecified astigmatism, left eye: Secondary | ICD-10-CM | POA: Diagnosis not present

## 2018-03-21 DIAGNOSIS — E113493 Type 2 diabetes mellitus with severe nonproliferative diabetic retinopathy without macular edema, bilateral: Secondary | ICD-10-CM | POA: Diagnosis not present

## 2018-03-22 ENCOUNTER — Ambulatory Visit
Admission: RE | Admit: 2018-03-22 | Discharge: 2018-03-22 | Disposition: A | Discharge status: Home / Self Care | Payer: Medicare HMO | Source: Ambulatory Visit | Attending: Gastroenterology | Admitting: Gastroenterology

## 2018-03-22 DIAGNOSIS — R1310 Dysphagia, unspecified: Secondary | ICD-10-CM

## 2018-03-22 DIAGNOSIS — K449 Diaphragmatic hernia without obstruction or gangrene: Secondary | ICD-10-CM | POA: Diagnosis not present

## 2018-03-23 ENCOUNTER — Telehealth: Payer: Self-pay | Admitting: Cardiovascular Disease

## 2018-03-23 ENCOUNTER — Telehealth: Payer: Self-pay | Admitting: *Deleted

## 2018-03-23 ENCOUNTER — Other Ambulatory Visit: Payer: Self-pay | Admitting: Gastroenterology

## 2018-03-23 NOTE — Telephone Encounter (Signed)
   [facility] Pre-operative Risk Assessment    Request for surgical clearance:  1. What type of surgery is being performed? endoscopy   2. When is this surgery scheduled? March 31, 2018   3. What type of clearance is required (medical clearance vs. Pharmacy clearance to hold med vs. Both)? Pharmacy   4. Are there any medications that need to be held prior to surgery and how long? Warfarin - 5 days   5. Practice name and name of physician performing surgery? Dr. Juanita Craver   6. What is your office phone number [phone]    7.   What is your office fax number (unknown - call came to DOD and MD spoke with office staff)  8.   Anesthesia type (None, local, MAC, general) ? Not specified    [name] 03/23/2018, 8:44 AM  _________________________________________________________________   (provider comments below)

## 2018-03-23 NOTE — Telephone Encounter (Signed)
Error

## 2018-03-23 NOTE — Telephone Encounter (Signed)
   Primary Cardiologist: Sanda Klein, MD  Chart reviewed as part of pre-operative protocol coverage. Patient was contacted 03/23/2018 in reference to pre-operative risk assessment for pending surgery as outlined below.  [name] was last seen on 12/2017 by Dr. Sallyanne Kuster. H/o CAD (prior stenting, single vessel CABG 07/2017), PAD s/p LE intervention, ICM, CHF (?diastolic - EF 08-02% in 2336), PAF, DM, lung CA, inflammatory esophagitis, HLD. Will route to pharm for input on Coumadin then plan to call pt.  Charlie Pitter, PA-C 03/23/2018, 3:20 PM

## 2018-03-24 NOTE — Telephone Encounter (Signed)
Pt takes warfarin for afib with CHADS2VASc score of 6 (age, sex, HTN, CHF, DM, CAD). Ok to hold warfarin for 5 days prior to procedure per protocol.

## 2018-03-24 NOTE — Telephone Encounter (Signed)
   Primary Cardiologist: Sanda Klein, MD  Chart reviewed as part of pre-operative protocol coverage by Melina Copa, PAC. Patient was contacted 03/23/2018 in reference to pre-operative risk assessment for pending surgery as outlined below.  [name] was last seen on 12/2017 by Dr. Sallyanne Kuster. H/o CAD (prior stenting, single vessel CABG 07/2017), PAD s/p LE intervention, ICM, CHF (?diastolic - EF 24-46% in 9507), PAF, DM, lung CA, inflammatory esophagitis, HLD.   Pt takes warfarin for afib with CHADS2VASc score of 6 (age, sex, HTN, CHF, DM, CAD). Ok to hold warfarin for 5 days prior to procedure per protocol.   Now that the pharmacist as weighed in, will route this to the requesting physician and removed from the preop pool.  Rosaria Ferries, PA-C 03/24/2018 9:19 AM Beeper [phone]     Dr Juanita Craver Phone Fax  [phone]

## 2018-03-27 DIAGNOSIS — R52 Pain, unspecified: Secondary | ICD-10-CM | POA: Diagnosis not present

## 2018-03-27 DIAGNOSIS — M19042 Primary osteoarthritis, left hand: Secondary | ICD-10-CM | POA: Diagnosis not present

## 2018-03-31 ENCOUNTER — Other Ambulatory Visit: Payer: Self-pay | Admitting: *Deleted

## 2018-03-31 NOTE — Patient Outreach (Signed)
[facility]) Care Management  03/31/2018  [name] [birth year] [id number]   RN Health Coach Monthly Outreach  Referral Date:11/23/2017 Referral Source:Transfer from [facility] Nurse Reason for Referral:Continued Disease Management Education Insurance:[employer]   Outreach Attempt: Multiple attempts to establish contact with patient without success. No response from letter mailed to patient. Case is being closed at this time.   Plan: RN Health Coach will close case at this time due to inability to maintain contact with patient. RN Health Coach will send MD case closure letter. RN Health Coach will send patient case closure letter.  [place] [phone] [email or web address]

## 2018-04-01 ENCOUNTER — Other Ambulatory Visit: Payer: Self-pay | Admitting: Cardiovascular Disease

## 2018-04-03 NOTE — Telephone Encounter (Signed)
Rx sent to pharmacy   

## 2018-04-04 ENCOUNTER — Ambulatory Visit: Payer: Medicare HMO | Admitting: Podiatry

## 2018-04-04 DIAGNOSIS — M79671 Pain in right foot: Secondary | ICD-10-CM

## 2018-04-04 DIAGNOSIS — L600 Ingrowing nail: Secondary | ICD-10-CM | POA: Diagnosis not present

## 2018-04-04 DIAGNOSIS — B351 Tinea unguium: Secondary | ICD-10-CM

## 2018-04-04 DIAGNOSIS — M79672 Pain in left foot: Secondary | ICD-10-CM | POA: Diagnosis not present

## 2018-04-04 NOTE — Patient Instructions (Signed)
Seen for hypertrophic nails. All nails debrided. Return in 3 months or as needed.  

## 2018-04-05 ENCOUNTER — Ambulatory Visit (INDEPENDENT_AMBULATORY_CARE_PROVIDER_SITE_OTHER): Payer: Medicare HMO | Admitting: Pharmacist Clinician (PhC)/ Clinical Pharmacy Specialist

## 2018-04-05 ENCOUNTER — Encounter: Payer: Self-pay | Admitting: Podiatry

## 2018-04-05 DIAGNOSIS — I480 Paroxysmal atrial fibrillation: Secondary | ICD-10-CM

## 2018-04-05 DIAGNOSIS — Z7901 Long term (current) use of anticoagulants: Secondary | ICD-10-CM

## 2018-04-05 LAB — POCT INR: INR: 1.2 — AB (ref 2.0–3.0)

## 2018-04-05 NOTE — Patient Instructions (Signed)
Description   Trake 1 tablet Friday, 1.5 tablets Saturday and Sunday then resume previous dose.  Repeat INR in 2 weeks

## 2018-04-05 NOTE — Progress Notes (Signed)
Subjective: 67 y.o. year old female patient presents complaining of painful nails. Patient requests toe nails trimmed. Patient has severe painful knee problem and unable to bend down to reach to her feet.  Objective: Dermatologic: Thick yellow deformed nails x 10. Severe deformed and ingrown nails on both great toes. Vascular: Pedal pulses are all palpable. Orthopedic: Contracted lesser digits bilateral. Neurologic: All epicritic and tactile sensations grossly intact.  Assessment: Dystrophic mycotic nails x 10. Ingrown hallucal nails painful.  Treatment: All mycotic nails debrided.  Return in 3 months or as needed.

## 2018-04-07 ENCOUNTER — Other Ambulatory Visit: Payer: Self-pay

## 2018-04-07 ENCOUNTER — Encounter (HOSPITAL_COMMUNITY): Payer: Self-pay | Admitting: *Deleted

## 2018-04-07 ENCOUNTER — Encounter (HOSPITAL_COMMUNITY)
Admission: RE | Disposition: A | Discharge status: Home / Self Care | Payer: Self-pay | Source: Ambulatory Visit | Attending: Gastroenterology

## 2018-04-07 ENCOUNTER — Ambulatory Visit (HOSPITAL_COMMUNITY)
Admission: RE | Admit: 2018-04-07 | Discharge: 2018-04-07 | Disposition: A | Discharge status: Home / Self Care | Payer: Medicare HMO | Source: Ambulatory Visit | Attending: Gastroenterology | Admitting: Gastroenterology

## 2018-04-07 DIAGNOSIS — E1151 Type 2 diabetes mellitus with diabetic peripheral angiopathy without gangrene: Secondary | ICD-10-CM | POA: Insufficient documentation

## 2018-04-07 DIAGNOSIS — K222 Esophageal obstruction: Secondary | ICD-10-CM | POA: Insufficient documentation

## 2018-04-07 DIAGNOSIS — I252 Old myocardial infarction: Secondary | ICD-10-CM | POA: Diagnosis not present

## 2018-04-07 DIAGNOSIS — I480 Paroxysmal atrial fibrillation: Secondary | ICD-10-CM | POA: Diagnosis not present

## 2018-04-07 DIAGNOSIS — Z885 Allergy status to narcotic agent status: Secondary | ICD-10-CM | POA: Insufficient documentation

## 2018-04-07 DIAGNOSIS — Z955 Presence of coronary angioplasty implant and graft: Secondary | ICD-10-CM | POA: Diagnosis not present

## 2018-04-07 DIAGNOSIS — Z881 Allergy status to other antibiotic agents status: Secondary | ICD-10-CM | POA: Insufficient documentation

## 2018-04-07 DIAGNOSIS — G4730 Sleep apnea, unspecified: Secondary | ICD-10-CM | POA: Diagnosis not present

## 2018-04-07 DIAGNOSIS — Z961 Presence of intraocular lens: Secondary | ICD-10-CM | POA: Diagnosis not present

## 2018-04-07 DIAGNOSIS — Z9841 Cataract extraction status, right eye: Secondary | ICD-10-CM | POA: Diagnosis not present

## 2018-04-07 DIAGNOSIS — Z87891 Personal history of nicotine dependence: Secondary | ICD-10-CM | POA: Diagnosis not present

## 2018-04-07 DIAGNOSIS — Z951 Presence of aortocoronary bypass graft: Secondary | ICD-10-CM | POA: Insufficient documentation

## 2018-04-07 DIAGNOSIS — Z794 Long term (current) use of insulin: Secondary | ICD-10-CM | POA: Insufficient documentation

## 2018-04-07 DIAGNOSIS — I10 Essential (primary) hypertension: Secondary | ICD-10-CM | POA: Insufficient documentation

## 2018-04-07 DIAGNOSIS — I2510 Atherosclerotic heart disease of native coronary artery without angina pectoris: Secondary | ICD-10-CM | POA: Diagnosis not present

## 2018-04-07 DIAGNOSIS — Z85118 Personal history of other malignant neoplasm of bronchus and lung: Secondary | ICD-10-CM | POA: Insufficient documentation

## 2018-04-07 DIAGNOSIS — R1310 Dysphagia, unspecified: Secondary | ICD-10-CM | POA: Diagnosis not present

## 2018-04-07 DIAGNOSIS — K449 Diaphragmatic hernia without obstruction or gangrene: Secondary | ICD-10-CM | POA: Insufficient documentation

## 2018-04-07 DIAGNOSIS — Z9842 Cataract extraction status, left eye: Secondary | ICD-10-CM | POA: Diagnosis not present

## 2018-04-07 HISTORY — PX: SAVORY DILATION: SHX5439

## 2018-04-07 HISTORY — PX: ESOPHAGOGASTRODUODENOSCOPY (EGD) WITH PROPOFOL: SHX5813

## 2018-04-07 LAB — GLUCOSE, CAPILLARY: Glucose-Capillary: 193 mg/dL — ABNORMAL HIGH (ref 70–99)

## 2018-04-07 SURGERY — ESOPHAGOGASTRODUODENOSCOPY (EGD) WITH PROPOFOL
Anesthesia: Moderate Sedation

## 2018-04-07 MED ORDER — FENTANYL CITRATE (PF) 100 MCG/2ML IJ SOLN
INTRAMUSCULAR | Status: AC
  Filled 2018-04-07: qty 2

## 2018-04-07 MED ORDER — MIDAZOLAM HCL 10 MG/2ML IJ SOLN
INTRAMUSCULAR | Status: DC | PRN
  Administered 2018-04-07 (×5): 2 mg via INTRAVENOUS

## 2018-04-07 MED ORDER — MIDAZOLAM HCL 5 MG/ML IJ SOLN
INTRAMUSCULAR | Status: AC
  Filled 2018-04-07: qty 2

## 2018-04-07 MED ORDER — BUTAMBEN-TETRACAINE-BENZOCAINE 2-2-14 % EX AERO
INHALATION_SPRAY | CUTANEOUS | Status: DC | PRN
  Administered 2018-04-07: 2 via TOPICAL

## 2018-04-07 MED ORDER — FENTANYL CITRATE (PF) 100 MCG/2ML IJ SOLN
INTRAMUSCULAR | Status: DC | PRN
  Administered 2018-04-07 (×4): 25 ug via INTRAVENOUS

## 2018-04-07 MED ORDER — SODIUM CHLORIDE 0.9 % IV SOLN
INTRAVENOUS | Status: DC
  Administered 2018-04-07: 500 mL via INTRAVENOUS

## 2018-04-07 SURGICAL SUPPLY — 14 items

## 2018-04-07 NOTE — H&P (Signed)
[name] HPI: This 67 year old black female presents to the office for a history of difficulty swallowing. This has been an issue when she tries to swallows pills, solids and liquids. She has a sensation of the pills and solids stuck in her throat. She has 1 BM per day with no obvious blood or mucus in the stool. She has nausea in the mornings. She denies any vomiting. She takes Pantoprazole for acid reflux with some relief. She denies any odynophagia. She has good appetite and her weight is stable. She denies any complaints of abdominal pain. She denies having a family history of colon cancer, celiac sprue, or IBD. Her last colonoscopy was done 04/03/12 that revealed diverticula in the sigmoid colon and a hyperplastic polyp was removed.   Past Medical History:  Diagnosis Date  . Arthritis    "knees" (02/05/2016)  . Cancer ([place]) 5/10   [facility] V[facility]   . Coronary artery disease   . GERD (gastroesophageal reflux disease)    tx meds  . H/O hiatal hernia   . History of blood transfusion "several"   last april 2015 s/p knee surgery (02/05/2016)  . Hypercholesteremia   . Hypertension   . Lung cancer ([place])    2010, surgery 18% left lung no radiation or chemo  . Non-STEMI (non-ST elevated myocardial infarction) ([place]) 04/07/2012   See cath results below  . Paroxysmal atrial fibrillation ([place]) 04/07/2012   On Warfarin  . Peripheral vascular disease ([place]) 8/12   Lt SFA PTA  . Presence of stent in LAD coronary artery 04/07/2012   Xience Expedition DES 2.75 mm x 18 mm (dilated to 3.0 mm)  . Sleep apnea    does not wear CPAP  . Type II diabetes mellitus (HCC)    insulin dependent    Past Surgical History:  Procedure Laterality Date  . ABDOMINAL HYSTERECTOMY  1990  . BREAST EXCISIONAL BIOPSY Right   . CARDIAC CATHETERIZATION  11/04/2008   patent RCA, LM, and Circ, nl EF  . CARDIAC CATHETERIZATION  02/20/2002   patent coronaries with the only abnormality being a smooth luminla irregularity in  the mid intermediate ramus branch no felt to be hemodynamically significant, nl LV  . CARDIAC CATHETERIZATION N/A 02/05/2016   Procedure: Left Heart Cath and Coronary Angiography;  Surgeon: Leonie Man, MD;  Location: [facility];  Service: Cardiovascular;  Laterality: N/A;  . CARDIAC CATHETERIZATION N/A 02/05/2016   Procedure: Coronary Stent Intervention;  Surgeon: Leonie Man, MD;  Location: [facility];  Service: Cardiovascular;  Laterality: N/A;  . CATARACT EXTRACTION W/ INTRAOCULAR LENS  IMPLANT, BILATERAL  2013  . CORONARY ANGIOPLASTY WITH STENT PLACEMENT  04/07/2012   Xience Expedition DES 2.27mm x 18 mm (dilated to 3.0 mm) to the prox LAD   . CORONARY ANGIOPLASTY WITH STENT PLACEMENT  2013; 2015  . CORONARY ARTERY BYPASS GRAFT N/A 08/17/2017   Procedure: CORONARY ARTERY BYPASS GRAFTING (CABG) TIMES 1 USING LEFT INTERNAL MAMMARY ARTERY;  Surgeon: Melrose Nakayama, MD;  Location: [facility];  Service: Open Heart Surgery;  Laterality: N/A;  . DILATION AND CURETTAGE OF UTERUS  <1990  . ESOPHAGOGASTRODUODENOSCOPY N/A 05/23/2015   Procedure: ESOPHAGOGASTRODUODENOSCOPY (EGD);  Surgeon: Carol Ada, MD;  Location: [facility];  Service: Endoscopy;  Laterality: N/A;  . ESOPHAGOGASTRODUODENOSCOPY (EGD) WITH PROPOFOL N/A 06/13/2015   Procedure: ESOPHAGOGASTRODUODENOSCOPY (EGD) WITH PROPOFOL;  Surgeon: Carol Ada, MD;  Location: [facility];  Service: Endoscopy;  Laterality: N/A;  . FRACTURE  SURGERY    . HAMMER TOE SURGERY Bilateral 1993   with screws, on screw removed  . JOINT REPLACEMENT    . KNEE ARTHROSCOPY Bilateral    left x2, right x1  . LAPAROSCOPIC CHOLECYSTECTOMY    . LEFT HEART CATH AND CORONARY ANGIOGRAPHY N/A 08/11/2017   Procedure: LEFT HEART CATH AND CORONARY ANGIOGRAPHY;  Surgeon: Martinique, Peter M, MD;  Location: [facility];  Service: Cardiovascular;  Laterality: N/A;  . LEFT HEART CATHETERIZATION WITH CORONARY ANGIOGRAM N/A 04/07/2012   Procedure:  LEFT HEART CATHETERIZATION WITH CORONARY ANGIOGRAM;  Surgeon: [name], MD;  Location: [facility];  Service: Cardiovascular;  Laterality: N/A;  . LEFT HEART CATHETERIZATION WITH CORONARY ANGIOGRAM N/A 05/27/2014   Procedure: LEFT HEART CATHETERIZATION WITH CORONARY ANGIOGRAM;  Surgeon: [name], MD; LAD 99% ISR, CFX 50-60%, RCA (dominant) no sig dz, EF 60%  . LOWER EXTREMITY ANGIOGRAM  05/17/11   directional atherectomy to the prox L SFA using a LX Man TurboHawk, ballooned with a [employer] balloon   . ORIF ANKLE FRACTURE  07/09/2012   Procedure: OPEN REDUCTION INTERNAL FIXATION (ORIF) ANKLE FRACTURE;  Surgeon: Meredith Pel, MD;  Location: [facility];  Service: Orthopedics;  Laterality: Left;  open reduction internal fixation trimalleolar ankle fracture medial malleolous fixation  . PERCUTANEOUS CORONARY STENT INTERVENTION (PCI-S) N/A 04/07/2012   Procedure: PERCUTANEOUS CORONARY STENT INTERVENTION (PCI-S);  Surgeon: [name], MD;  Location: [facility];  Service: Cardiovascular;  Laterality: N/A;  . PERCUTANEOUS CORONARY STENT INTERVENTION (PCI-S)  05/27/2014   Procedure: PERCUTANEOUS CORONARY STENT INTERVENTION (PCI-S);  Surgeon: [name], MD; 3 mm x 12 mm long Xience Xpedition DES to the proximal LAD  . SAVORY DILATION N/A 06/13/2015   Procedure: SAVORY DILATION;  Surgeon: Carol Ada, MD;  [facility];  Service: Endoscopy;  Laterality: N/A;  . TEE WITHOUT CARDIOVERSION N/A 08/17/2017   Procedure: TRANSESOPHAGEAL ECHOCARDIOGRAM (TEE);  Surgeon: Melrose Nakayama, MD;  Location: [facility];  Service: Open Heart Surgery;  Laterality: N/A;  . TONSILLECTOMY    . TOTAL KNEE ARTHROPLASTY Left 2003  . TOTAL KNEE REVISION Left 11/05/2013   Procedure: LEFT TOTAL KNEE RESECTION;  Surgeon: Mauri Pole, MD;  Location: [facility];  Service: Orthopedics;  Laterality: Left;  . TOTAL KNEE REVISION Left 2007   opened in 2006 and cleaned out, 2007 revision  . TOTAL KNEE  REVISION Left 12/31/2013   Procedure: RE-INPLANTATION LEFT TOTAL KNEE ;  Surgeon: Mauri Pole, MD;  Location: [facility];  Service: Orthopedics;  Laterality: Left;  Marland Kitchen VIDEO ASSISTED THORACOSCOPY (VATS)/ LOBECTOMY  01/2009    Family History  Problem Relation Age of Onset  . Hypertension Mother   . Coronary artery disease Brother   . Hypertension Sister     Social History:  reports that she quit smoking about 33 years ago. Her smoking use included cigarettes. She has a 20.00 pack-year smoking history. She has never used smokeless tobacco. She reports that she does not drink alcohol or use drugs.  Allergies:  Allergies  Allergen Reactions  . Levofloxacin Itching  . Morphine And Related Itching    Pt reports oxycodone history with no allergy.    Medications:  Scheduled:  Continuous: . sodium chloride 500 mL (04/07/18 1039)    Results for orders placed or performed during the hospital encounter of 04/07/18 (from the past 24 hour(s))  Glucose, capillary     Status: Abnormal   Collection Time: 04/07/18 10:38  AM  Result Value Ref Range   Glucose-Capillary 193 (H) 70 - 99 mg/dL     No results found.  ROS:  As stated above in the HPI otherwise negative.  Blood pressure (!) 176/54, pulse 72, temperature 98.3 F (36.8 C), temperature source Oral, resp. rate 20, height 5\' 3"  (1.6 m), weight 125.6 kg (277 lb), SpO2 98 %.    PE: Gen: NAD, Alert and Oriented HEENT:  [place]/AT, EOMI Neck: Supple, no LAD Lungs: CTA Bilaterally CV: RRR without M/G/R ABM: Soft, NTND, +BS Ext: No C/C/E  Assessment/Plan: 1) Dysphagia - EGD with dilation.  Dontrae Morini D 04/07/2018, 10:57 AM

## 2018-04-07 NOTE — Op Note (Addendum)
[facility] Patient Name: [name] Procedure Date: 04/07/2018 MRN: [id number] Attending MD: Carol Ada , MD Date of Birth: 01/14/1951 CSN: [id number] Age: 67 Admit Type: Outpatient Procedure:                Upper GI endoscopy Indications:              Dysphagia Providers:                Carol Ada, MD, Kingsley Plan, RN, Elspeth Cho Tech., Technician Referring MD:              Medicines:                Fentanyl 100 micrograms IV, Midazolam 10 mg IV Complications:            No immediate complications. Estimated Blood Loss:     Estimated blood loss was minimal. Procedure:                Pre-Anesthesia Assessment:                           - Prior to the procedure, a History and Physical                            was performed, and patient medications and                            allergies were reviewed. The patient's tolerance of                            previous anesthesia was also reviewed. The risks                            and benefits of the procedure and the sedation                            options and risks were discussed with the patient.                            All questions were answered, and informed consent                            was obtained. Prior Anticoagulants: The patient has                            taken Coumadin (warfarin), last dose was 5 days                            prior to procedure. ASA Grade Assessment: III - A                            patient with severe systemic disease. After  reviewing the risks and benefits, the patient was                            deemed in satisfactory condition to undergo the                            procedure.                           - Sedation was administered by an endoscopy nurse.                            The sedation level attained was moderate.                           After obtaining informed consent, the  endoscope was                            passed under direct vision. Throughout the                            procedure, the patient's blood pressure, pulse, and                            oxygen saturations were monitored continuously. The                            [id number]) scope was introduced through the                            mouth, and advanced to the second part of duodenum.                            The upper GI endoscopy was accomplished without                            difficulty. The patient tolerated the procedure                            well. Scope In: Scope Out: Findings:      Two benign-appearing, intrinsic mild stenoses were found at the cervical       esophagus and the distal esophagus. The narrowest stenosis measured 1.4       cm (inner diameter) x less than one cm (in length). The stenoses were       traversed. A guidewire was placed and the scope was withdrawn. Dilation       was performed with a Savary dilator with no resistance at 15 mm and 16       mm. The dilation site was examined following endoscope reinsertion and       showed complete resolution of luminal narrowing, however, the distal       esophageal mucosal breaks were 2 cm in the distal esophagus. Estimated       blood loss was minimal.      A 2 cm hiatal hernia was present.  The stomach was normal.      The examined duodenum was normal. Impression:               - Benign-appearing esophageal stenoses. Dilated.                           - 2 cm hiatal hernia.                           - Normal stomach.                           - Normal examined duodenum.                           - No specimens collected. Moderate Sedation:      Moderate (conscious) sedation was administered by the endoscopy nurse       and supervised by the endoscopist. The following parameters were       monitored: oxygen saturation, heart rate, blood pressure, and response       to care. Recommendation:            - Patient has a contact number available for                            emergencies. The signs and symptoms of potential                            delayed complications were discussed with the                            patient. Return to normal activities tomorrow.                            Written discharge instructions were provided to the                            patient.                           - Resume previous diet.                           - Continue present medications.                           - Repeat upper endoscopy PRN for dysphagia.                           - Restart coumadin. Procedure Code(s):        --- Professional ---                           [phone], Esophagogastroduodenoscopy, flexible,                            transoral; with insertion of guide wire followed by  passage of dilator(s) through esophagus over guide                            wire Diagnosis Code(s):        --- Professional ---                           K22.2, Esophageal obstruction                           K44.9, Diaphragmatic hernia without obstruction or                            gangrene                           R13.10, Dysphagia, unspecified CPT copyright 2017 [employer]. All rights reserved. The codes documented in this report are preliminary and upon coder review may  be revised to meet current compliance requirements. Carol Ada, MD Carol Ada, MD 04/07/2018 11:41:48 AM This report has been signed electronically. Number of Addenda: 0

## 2018-04-07 NOTE — Discharge Instructions (Signed)

## 2018-04-24 ENCOUNTER — Ambulatory Visit
Admission: RE | Admit: 2018-04-24 | Discharge: 2018-04-24 | Disposition: A | Discharge status: Home / Self Care | Payer: Medicare HMO | Source: Ambulatory Visit | Attending: Internal Medicine | Admitting: Internal Medicine

## 2018-04-24 DIAGNOSIS — Z1231 Encounter for screening mammogram for malignant neoplasm of breast: Secondary | ICD-10-CM | POA: Diagnosis not present

## 2018-04-26 ENCOUNTER — Telehealth: Payer: Self-pay | Admitting: Pharmacist Clinician (PhC)/ Clinical Pharmacy Specialist

## 2018-04-26 ENCOUNTER — Ambulatory Visit (INDEPENDENT_AMBULATORY_CARE_PROVIDER_SITE_OTHER): Payer: Medicare HMO | Admitting: Pharmacist Clinician (PhC)/ Clinical Pharmacy Specialist

## 2018-04-26 DIAGNOSIS — I480 Paroxysmal atrial fibrillation: Secondary | ICD-10-CM | POA: Diagnosis not present

## 2018-04-26 DIAGNOSIS — Z7901 Long term (current) use of anticoagulants: Secondary | ICD-10-CM | POA: Diagnosis not present

## 2018-04-26 LAB — POCT INR: INR: 2.1 (ref 2.0–3.0)

## 2018-04-26 NOTE — Telephone Encounter (Signed)
Patient complaining about increased swelling in her ankles recently.  She currently takes lasix 40 mg three times weekly and states it is not enough.    Reviewed her diet - she admits to snacking on crackers and eating heavy salad dressings [facility]) regularly.     Advised her to take Lasix 40 mg daily for 4 days (with potassium) and stop eating salty snacks.  She is to call next week to let Dr. Loletha Grayer know if she has improved or not.

## 2018-04-27 ENCOUNTER — Other Ambulatory Visit: Payer: Self-pay | Admitting: Cardiovascular Disease

## 2018-04-27 NOTE — Telephone Encounter (Signed)
Rx request sent to pharmacy.  

## 2018-05-09 DIAGNOSIS — I10 Essential (primary) hypertension: Secondary | ICD-10-CM | POA: Diagnosis not present

## 2018-05-09 DIAGNOSIS — E1065 Type 1 diabetes mellitus with hyperglycemia: Secondary | ICD-10-CM | POA: Diagnosis not present

## 2018-05-09 DIAGNOSIS — Z0000 Encounter for general adult medical examination without abnormal findings: Secondary | ICD-10-CM | POA: Diagnosis not present

## 2018-05-09 DIAGNOSIS — E782 Mixed hyperlipidemia: Secondary | ICD-10-CM | POA: Diagnosis not present

## 2018-05-15 DIAGNOSIS — E782 Mixed hyperlipidemia: Secondary | ICD-10-CM | POA: Diagnosis not present

## 2018-05-15 DIAGNOSIS — I2510 Atherosclerotic heart disease of native coronary artery without angina pectoris: Secondary | ICD-10-CM | POA: Diagnosis not present

## 2018-05-15 DIAGNOSIS — Z0000 Encounter for general adult medical examination without abnormal findings: Secondary | ICD-10-CM | POA: Diagnosis not present

## 2018-05-15 DIAGNOSIS — Z85118 Personal history of other malignant neoplasm of bronchus and lung: Secondary | ICD-10-CM | POA: Diagnosis not present

## 2018-05-15 DIAGNOSIS — D500 Iron deficiency anemia secondary to blood loss (chronic): Secondary | ICD-10-CM | POA: Diagnosis not present

## 2018-05-15 DIAGNOSIS — E1059 Type 1 diabetes mellitus with other circulatory complications: Secondary | ICD-10-CM | POA: Diagnosis not present

## 2018-05-15 DIAGNOSIS — I482 Chronic atrial fibrillation: Secondary | ICD-10-CM | POA: Diagnosis not present

## 2018-05-15 DIAGNOSIS — I129 Hypertensive chronic kidney disease with stage 1 through stage 4 chronic kidney disease, or unspecified chronic kidney disease: Secondary | ICD-10-CM | POA: Diagnosis not present

## 2018-05-15 DIAGNOSIS — D638 Anemia in other chronic diseases classified elsewhere: Secondary | ICD-10-CM | POA: Diagnosis not present

## 2018-05-15 DIAGNOSIS — E1065 Type 1 diabetes mellitus with hyperglycemia: Secondary | ICD-10-CM | POA: Diagnosis not present

## 2018-05-15 DIAGNOSIS — I5032 Chronic diastolic (congestive) heart failure: Secondary | ICD-10-CM | POA: Diagnosis not present

## 2018-05-21 IMAGING — CR DG CHEST 2V
2 series · 2 of 2 positions shown · non-contrast
Comparison: Single-view of the chest 08/20/2017 08/19/2017. PA and
lateral chest 08/09/2017. CT chest 08/15/2017.

CLINICAL DATA: Status post CABG 08/17/2017.  Shortness of breath.

EXAM:
CHEST  2 VIEW

[w chest lat]
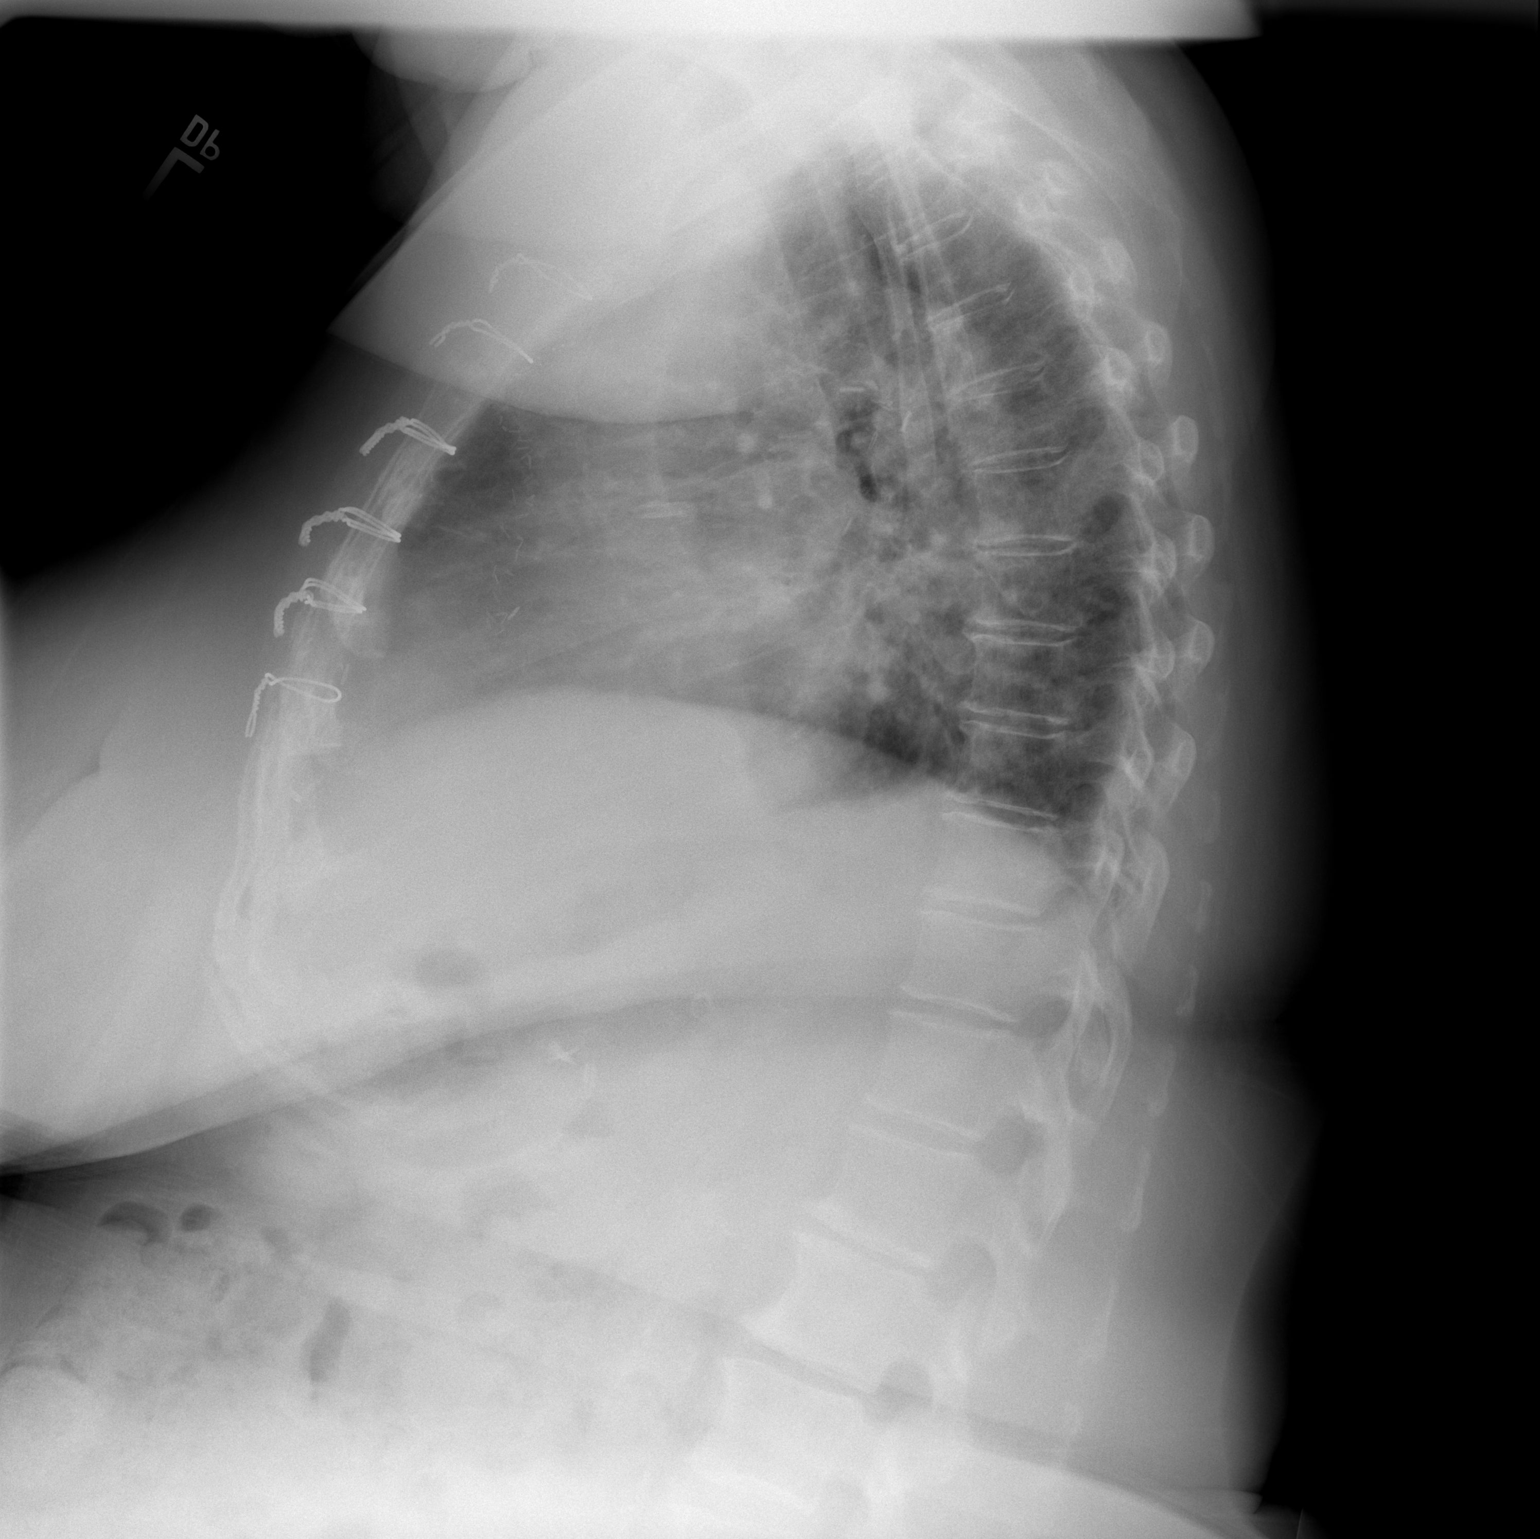

[w chest ap]
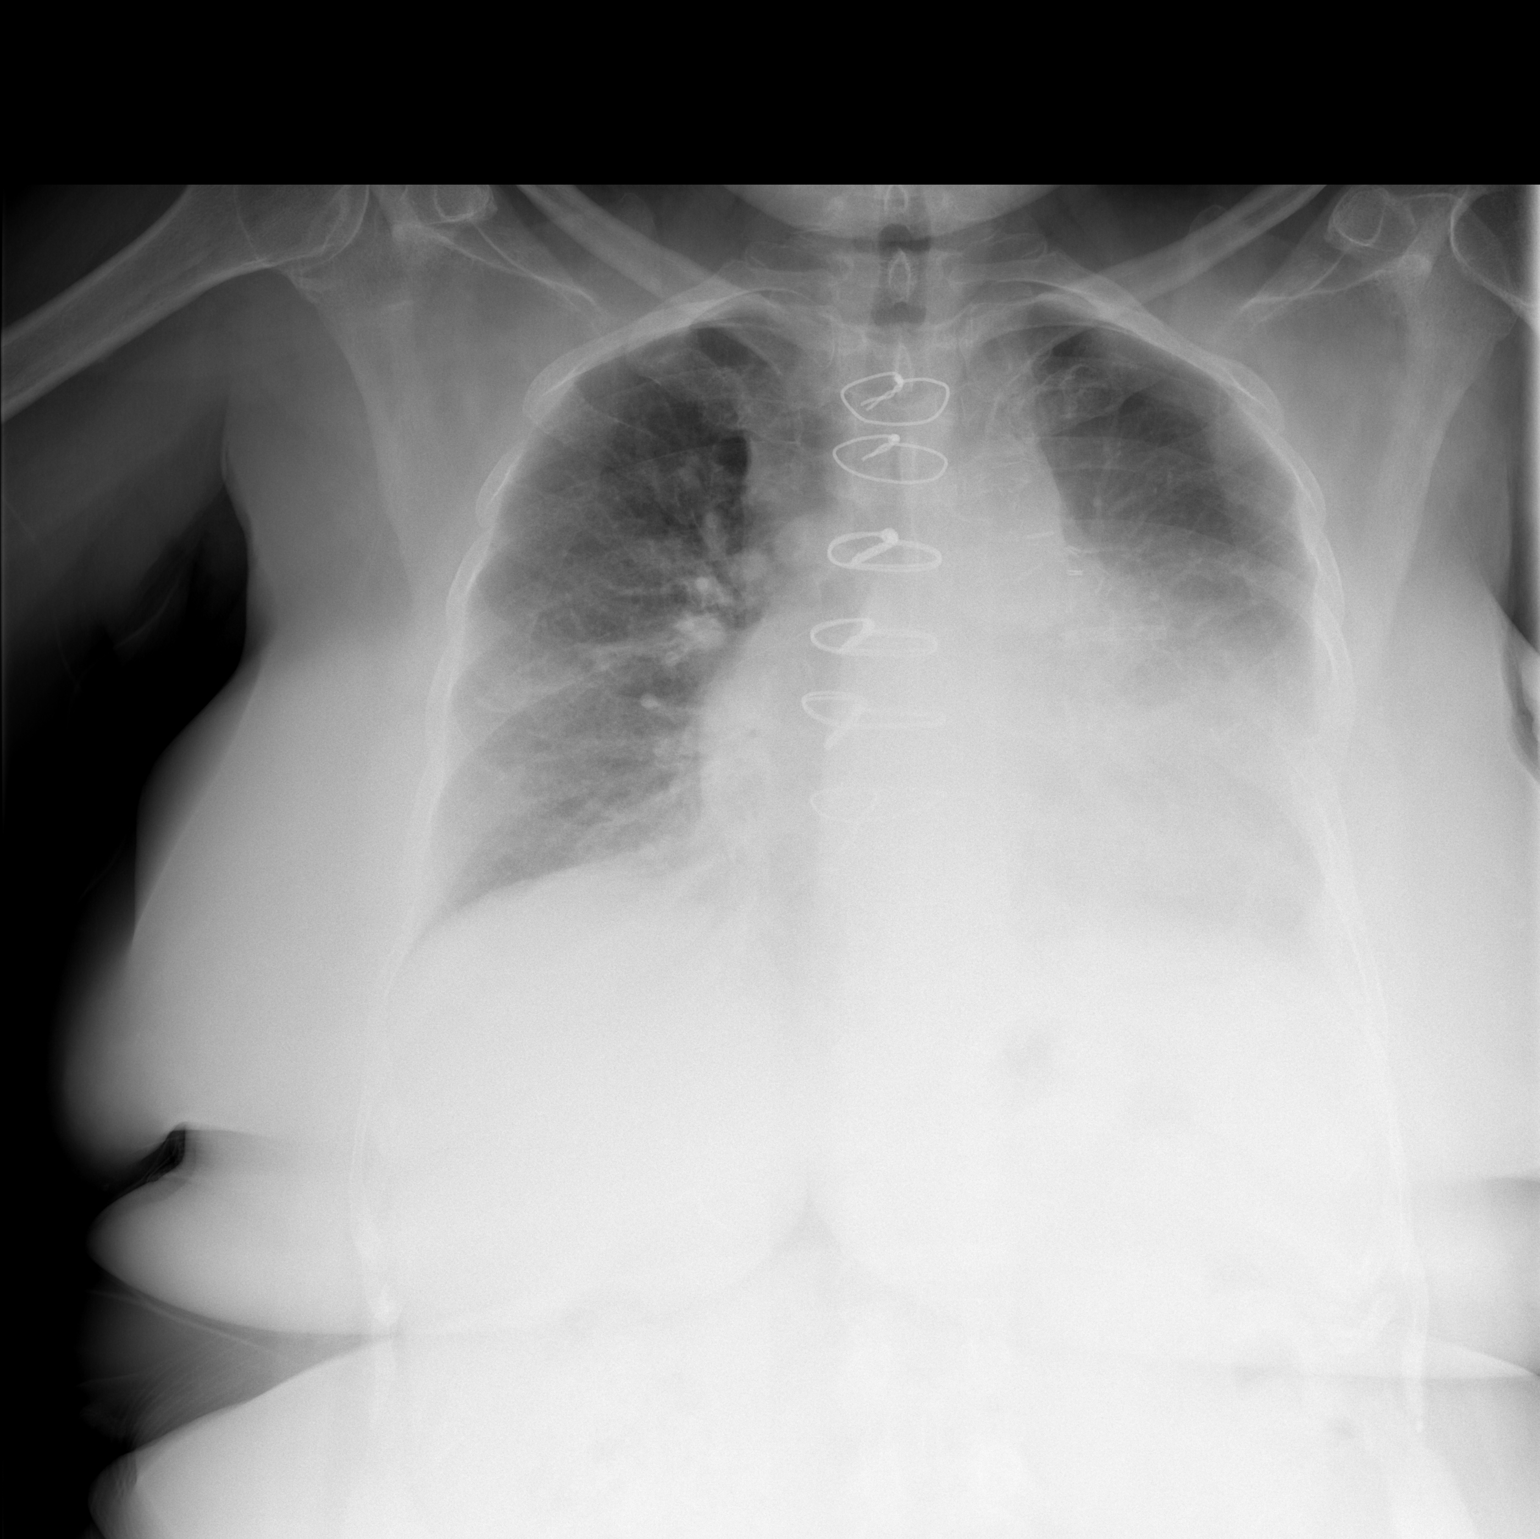

[2 of 2 positions shown; findings below may reference images not displayed]

FINDINGS: Six intact median sternotomy wires are unchanged. There is marked
cardiomegaly with vascular congestion. No consolidative process,
pneumothorax or effusion. No acute bony abnormality.
IMPRESSION: Cardiomegaly and pulmonary vascular congestion. No acute
abnormality.

## 2018-05-26 ENCOUNTER — Ambulatory Visit (INDEPENDENT_AMBULATORY_CARE_PROVIDER_SITE_OTHER): Payer: Medicare HMO | Admitting: Pharmacist

## 2018-05-26 DIAGNOSIS — I480 Paroxysmal atrial fibrillation: Secondary | ICD-10-CM

## 2018-05-26 DIAGNOSIS — Z7901 Long term (current) use of anticoagulants: Secondary | ICD-10-CM | POA: Diagnosis not present

## 2018-05-26 LAB — POCT INR: INR: 1.7 — AB (ref 2.0–3.0)

## 2018-06-13 DIAGNOSIS — E1065 Type 1 diabetes mellitus with hyperglycemia: Secondary | ICD-10-CM | POA: Diagnosis not present

## 2018-06-16 ENCOUNTER — Ambulatory Visit (INDEPENDENT_AMBULATORY_CARE_PROVIDER_SITE_OTHER): Payer: Medicare HMO | Admitting: Pharmacist Clinician (PhC)/ Clinical Pharmacy Specialist

## 2018-06-16 DIAGNOSIS — Z7901 Long term (current) use of anticoagulants: Secondary | ICD-10-CM

## 2018-06-16 DIAGNOSIS — I480 Paroxysmal atrial fibrillation: Secondary | ICD-10-CM

## 2018-06-16 LAB — POCT INR: INR: 1.9 — AB (ref 2.0–3.0)

## 2018-06-27 DIAGNOSIS — H04123 Dry eye syndrome of bilateral lacrimal glands: Secondary | ICD-10-CM | POA: Diagnosis not present

## 2018-06-27 DIAGNOSIS — H0100B Unspecified blepharitis left eye, upper and lower eyelids: Secondary | ICD-10-CM | POA: Diagnosis not present

## 2018-06-27 DIAGNOSIS — H0100A Unspecified blepharitis right eye, upper and lower eyelids: Secondary | ICD-10-CM | POA: Diagnosis not present

## 2018-07-05 ENCOUNTER — Ambulatory Visit: Payer: Medicare HMO | Admitting: Podiatry

## 2018-07-08 ENCOUNTER — Other Ambulatory Visit: Payer: Self-pay | Admitting: Cardiovascular Disease

## 2018-07-10 ENCOUNTER — Encounter: Payer: Self-pay | Admitting: Cardiovascular Disease

## 2018-07-10 ENCOUNTER — Ambulatory Visit: Payer: Medicare HMO | Admitting: Cardiovascular Disease

## 2018-07-10 ENCOUNTER — Ambulatory Visit (INDEPENDENT_AMBULATORY_CARE_PROVIDER_SITE_OTHER): Payer: Medicare HMO | Admitting: Pharmacist

## 2018-07-10 VITALS — BP 128/58 | HR 74 | Ht 63.0 in | Wt 289.4 lb

## 2018-07-10 DIAGNOSIS — I739 Peripheral vascular disease, unspecified: Secondary | ICD-10-CM

## 2018-07-10 DIAGNOSIS — N183 Chronic kidney disease, stage 3 unspecified: Secondary | ICD-10-CM

## 2018-07-10 DIAGNOSIS — I480 Paroxysmal atrial fibrillation: Secondary | ICD-10-CM

## 2018-07-10 DIAGNOSIS — E669 Obesity, unspecified: Secondary | ICD-10-CM | POA: Diagnosis not present

## 2018-07-10 DIAGNOSIS — E7800 Pure hypercholesterolemia, unspecified: Secondary | ICD-10-CM

## 2018-07-10 DIAGNOSIS — E1169 Type 2 diabetes mellitus with other specified complication: Secondary | ICD-10-CM

## 2018-07-10 DIAGNOSIS — I2510 Atherosclerotic heart disease of native coronary artery without angina pectoris: Secondary | ICD-10-CM

## 2018-07-10 DIAGNOSIS — Z7901 Long term (current) use of anticoagulants: Secondary | ICD-10-CM

## 2018-07-10 DIAGNOSIS — I10 Essential (primary) hypertension: Secondary | ICD-10-CM | POA: Diagnosis not present

## 2018-07-10 DIAGNOSIS — I5032 Chronic diastolic (congestive) heart failure: Secondary | ICD-10-CM | POA: Diagnosis not present

## 2018-07-10 LAB — POCT INR: INR: 2.5 (ref 2.0–3.0)

## 2018-07-10 NOTE — Progress Notes (Signed)
Patient ID: [name], female   DOB: 1951-01-25, 67 y.o.   MRN: [id number]  Patient ID: [name], female   DOB: 09/04/1951, 67 y.o.   MRN: [id number]    Cardiology Office Note    Date:  07/15/2018   ID:  [name], DOB 1951/03/27, MRN [id number]  PCP:  [name], MD  Cardiologist:   Sanda Klein, MD   Chief Complaint  Patient presents with  . Coronary Artery Disease    History of Present Illness:  [name] is a 67 y.o. female with CAD and PAD, ischemic cardiomyopathy, CHF and paroxysmal atrial fibrillation.  She is now roughly 11 months following single-vessel LIMA to LAD bypass for recurrent in-stent restenosis multiple PCI.  She has a history of recurrent paroxysmal atrial fibrillation, diabetes mellitus requiring insulin, morbid obesity.    She generally feels well and does not have any cardiovascular complaints.  She seems to be walking better after cardiac rehab, although she still has a lot of knee pain.  She had labs with Dr. Ashby Dawes but I do not yet have a copy of them.  They were "good".  The patient specifically denies any chest pain at rest exertion, dyspnea at rest or with exertion, orthopnea, paroxysmal nocturnal dyspnea, syncope, palpitations, focal neurological deficits, intermittent claudication, lower extremity edema, unexplained weight gain, cough, hemoptysis or wheezing.  In 2013 she received a drug-eluting stent to the LAD artery. In July 2015 she had high-grade in-stent restenosis treated with balloon angioplasty and placement of a new 312 mm drug-eluting Xience expedition stent to the distal edge of the previous stent. Incidental note made of a 50-60% mid AV groove left circumflex stenosis. In April 2017 she presented with chest pain and a nuclear study showed reversible ischemia after which she received another drug-eluting stent covering the 2 previous stents in the proximal LAD. In November 2018 she returned again  with in-stent restenosis and was referred for single-vessel LIMA to LAD bypass surgery.She has normal left ventricular systolic function and no congestive heart failure.  In addition she has history of PAD status post atherectomy of the left superficial femoral artery and paroxysmal atrial fibrillation (on chronic warfarin anticoagulation). Additional comorbid conditions include hypertension, obesity, type 2 diabetes mellitus and gastroesophageal reflux disease. She has a history of left upper lobe lobectomy for adenocarcinoma of the lung with curative intent February 2010. She has a history of inflammatory esophagitis (noneosinophilic), stricture (which was dilated) and cervical web. (Dr. Benson Norway, EGD 2016 and re-dilated July 2019).   Past Medical History:  Diagnosis Date  . Arthritis    "knees" (02/05/2016)  . Cancer ([place]) 5/10   [facility] Vats   . Coronary artery disease   . GERD (gastroesophageal reflux disease)    tx meds  . H/O hiatal hernia   . History of blood transfusion "several"   last april 2015 s/p knee surgery (02/05/2016)  . Hypercholesteremia   . Hypertension   . Lung cancer ([place])    2010, surgery 18% left lung no radiation or chemo  . Non-STEMI (non-ST elevated myocardial infarction) ([place]) 04/07/2012   See cath results below  . Paroxysmal atrial fibrillation ([place]) 04/07/2012   On Warfarin  . Peripheral vascular disease ([place]) 8/12   Lt SFA PTA  . Presence of stent in LAD coronary artery 04/07/2012   Xience Expedition DES 2.75 mm x 18 mm (dilated to 3.0 mm)  . Sleep apnea    does not wear CPAP  . Type  II diabetes mellitus ([place])    insulin dependent    Past Surgical History:  Procedure Laterality Date  . ABDOMINAL HYSTERECTOMY  1990  . BREAST EXCISIONAL BIOPSY Right   . CARDIAC CATHETERIZATION  11/04/2008   patent RCA, LM, and Circ, nl EF  . CARDIAC CATHETERIZATION  02/20/2002   patent coronaries with the only abnormality being a smooth luminla irregularity in the mid  intermediate ramus branch no felt to be hemodynamically significant, nl LV  . CARDIAC CATHETERIZATION N/A 02/05/2016   Procedure: Left Heart Cath and Coronary Angiography;  Surgeon: Leonie Man, MD;  Location: [facility];  Service: Cardiovascular;  Laterality: N/A;  . CARDIAC CATHETERIZATION N/A 02/05/2016   Procedure: Coronary Stent Intervention;  Surgeon: Leonie Man, MD;  Location: [facility];  Service: Cardiovascular;  Laterality: N/A;  . CATARACT EXTRACTION W/ INTRAOCULAR LENS  IMPLANT, BILATERAL  2013  . CORONARY ANGIOPLASTY WITH STENT PLACEMENT  04/07/2012   Xience Expedition DES 2.57mm x 18 mm (dilated to 3.0 mm) to the prox LAD   . CORONARY ANGIOPLASTY WITH STENT PLACEMENT  2013; 2015  . CORONARY ARTERY BYPASS GRAFT N/A 08/17/2017   Procedure: CORONARY ARTERY BYPASS GRAFTING (CABG) TIMES 1 USING LEFT INTERNAL MAMMARY ARTERY;  Surgeon: Melrose Nakayama, MD;  Location: [facility];  Service: Open Heart Surgery;  Laterality: N/A;  . DILATION AND CURETTAGE OF UTERUS  <1990  . ESOPHAGOGASTRODUODENOSCOPY N/A 05/23/2015   Procedure: ESOPHAGOGASTRODUODENOSCOPY (EGD);  Surgeon: Carol Ada, MD;  Location: [facility];  Service: Endoscopy;  Laterality: N/A;  . ESOPHAGOGASTRODUODENOSCOPY (EGD) WITH PROPOFOL N/A 06/13/2015   Procedure: ESOPHAGOGASTRODUODENOSCOPY (EGD) WITH PROPOFOL;  Surgeon: Carol Ada, MD;  Location: [facility];  Service: Endoscopy;  Laterality: N/A;  . ESOPHAGOGASTRODUODENOSCOPY (EGD) WITH PROPOFOL N/A 04/07/2018   Procedure: ESOPHAGOGASTRODUODENOSCOPY (EGD) WITH PROPOFOL;  Surgeon: Carol Ada, MD;  Location: [facility];  Service: Endoscopy;  Laterality: N/A;  fluoroscopy is needed  . FRACTURE SURGERY    . HAMMER TOE SURGERY Bilateral 1993   with screws, on screw removed  . JOINT REPLACEMENT    . KNEE ARTHROSCOPY Bilateral    left x2, right x1  . LAPAROSCOPIC CHOLECYSTECTOMY    . LEFT HEART CATH AND CORONARY ANGIOGRAPHY N/A 08/11/2017    Procedure: LEFT HEART CATH AND CORONARY ANGIOGRAPHY;  Surgeon: Martinique, Peter M, MD;  Location: [facility];  Service: Cardiovascular;  Laterality: N/A;  . LEFT HEART CATHETERIZATION WITH CORONARY ANGIOGRAM N/A 04/07/2012   Procedure: LEFT HEART CATHETERIZATION WITH CORONARY ANGIOGRAM;  Surgeon: [name], MD;  Location: [facility];  Service: Cardiovascular;  Laterality: N/A;  . LEFT HEART CATHETERIZATION WITH CORONARY ANGIOGRAM N/A 05/27/2014   Procedure: LEFT HEART CATHETERIZATION WITH CORONARY ANGIOGRAM;  Surgeon: [name], MD; LAD 99% ISR, CFX 50-60%, RCA (dominant) no sig dz, EF 60%  . LOWER EXTREMITY ANGIOGRAM  05/17/11   directional atherectomy to the prox L SFA using a LX Man TurboHawk, ballooned with a [employer] balloon   . ORIF ANKLE FRACTURE  07/09/2012   Procedure: OPEN REDUCTION INTERNAL FIXATION (ORIF) ANKLE FRACTURE;  Surgeon: Meredith Pel, MD;  Location: [facility];  Service: Orthopedics;  Laterality: Left;  open reduction internal fixation trimalleolar ankle fracture medial malleolous fixation  . PERCUTANEOUS CORONARY STENT INTERVENTION (PCI-S) N/A 04/07/2012   Procedure: PERCUTANEOUS CORONARY STENT INTERVENTION (PCI-S);  Surgeon: [name], MD;  Location: [facility] C[facility];  Service: Cardiovascular;  Laterality: N/A;  . PERCUTANEOUS CORONARY STENT INTERVENTION (PCI-S)  05/27/2014   Procedure: PERCUTANEOUS CORONARY STENT INTERVENTION (PCI-S);  Surgeon: [name], MD; 3 mm x 12 mm long Xience Xpedition DES to the proximal LAD  . SAVORY DILATION N/A 06/13/2015   Procedure: SAVORY DILATION;  Surgeon: Carol Ada, MD;  [facility];  Service: Endoscopy;  Laterality: N/A;  . SAVORY DILATION N/A 04/07/2018   Procedure: SAVORY DILATION;  Surgeon: Carol Ada, MD;  [facility];  Service: Endoscopy;  Laterality: N/A;  . TEE WITHOUT CARDIOVERSION N/A 08/17/2017   Procedure: TRANSESOPHAGEAL ECHOCARDIOGRAM (TEE);  Surgeon: Melrose Nakayama,  MD;  Location: [facility];  Service: Open Heart Surgery;  Laterality: N/A;  . TONSILLECTOMY    . TOTAL KNEE ARTHROPLASTY Left 2003  . TOTAL KNEE REVISION Left 11/05/2013   Procedure: LEFT TOTAL KNEE RESECTION;  Surgeon: Mauri Pole, MD;  [facility];  Service: Orthopedics;  Laterality: Left;  . TOTAL KNEE REVISION Left 2007   opened in 2006 and cleaned out, 2007 revision  . TOTAL KNEE REVISION Left 12/31/2013   Procedure: RE-INPLANTATION LEFT TOTAL KNEE ;  Surgeon: Mauri Pole, MD;  Location: [facility];  Service: Orthopedics;  Laterality: Left;  Marland Kitchen VIDEO ASSISTED THORACOSCOPY (VATS)/ LOBECTOMY  01/2009    Current Medications: Outpatient Medications Prior to Visit  Medication Sig Dispense Refill  . aspirin EC 81 MG tablet Take 81 mg by mouth daily.    . fluticasone (FLONASE) 50 MCG/ACT nasal spray Place 2 sprays into both nostrils daily.     . furosemide (LASIX) 40 MG tablet Take 1 tablet (40 mg total) by mouth daily. (Patient taking differently: Take 40 mg by mouth 3 (three) times a week. Take 40 mg Monday, Wednesday, and Friday each week.) 90 tablet 3  . glimepiride (AMARYL) 2 MG tablet Take 2 mg by mouth daily with breakfast.    . insulin NPH-regular Human (NOVOLIN 70/30) (70-30) 100 UNIT/ML injection Inject 30-34 Units into the skin 2 (two) times daily with a meal. Prior to surgery, she took 42 units in the morning 20 units in the evening. As of 08/23/2017, she is on 20 units in am and 20 units in the pm. Her Insulin gradually needs to be increased to pre surgery doses as glucose levels allow. (Patient taking differently: Inject 20 Units into the skin 2 (two) times daily before a meal. ) 10 mL 11  . lisinopril-hydrochlorothiazide (PRINZIDE,ZESTORETIC) 20-25 MG per tablet Take 1 tablet by mouth daily.    . metoprolol tartrate (LOPRESSOR) 25 MG tablet Take 1 tablet (25 mg total) by mouth 3 (three) times daily. 60 tablet 11  . metoprolol tartrate (LOPRESSOR) 25 MG tablet TAKE 1 TABLET BY MOUTH  TWICE A DAY 180 tablet 3  . NONFORMULARY OR COMPOUNDED ITEM Med Name: Anti-Inflammatory Cream: Diclofenac 3%/ Baclofen 2%/ Cyclobenzaprine 2%/ Lidocaine 2%    . pantoprazole (PROTONIX) 40 MG tablet TAKE 1 TABLET BY MOUTH EVERY DAY (Patient taking differently: Take 40 mg by mouth once a day) 90 tablet 2  . potassium chloride (MICRO-K) 10 MEQ CR capsule Take 1 capsule (10 mEq total) by mouth daily. 30 capsule 6  . simvastatin (ZOCOR) 40 MG tablet TAKE 1 TABLET BY MOUTH EVERYDAY AT BEDTIME 90 tablet 0  . Vitamin D, Cholecalciferol, 1000 units TABS Take 1,000 Units by mouth every morning.     . warfarin (COUMADIN) 7.5 MG tablet Take 7.5 mg by mouth daily. 7.5 mg in the evening on Sun/Tues/Thurs/Sat and 3.75 mg on Mon/Wed/Fri    .  cephALEXin (KEFLEX) 500 MG capsule Take 500 mg by mouth 3 (three) times daily.  0   No facility-administered medications prior to visit.      Allergies:   Levofloxacin and Morphine and related   Social History   Socioeconomic History  . Marital status: Single    Spouse name: Not on file  . Number of children: Not on file  . Years of education: Not on file  . Highest education level: Not on file  Occupational History  . Occupation: Retired  Scientific laboratory technician  . Financial resource strain: Not on file  . Food insecurity:    Worry: Not on file    Inability: Not on file  . Transportation needs:    Medical: Not on file    Non-medical: Not on file  Tobacco Use  . Smoking status: Former Smoker    Packs/day: 1.00    Years: 20.00    Pack years: 20.00    Types: Cigarettes    Last attempt to quit: 09/27/1984    Years since quitting: 33.8  . Smokeless tobacco: Never Used  Substance and Sexual Activity  . Alcohol use: No  . Drug use: No  . Sexual activity: Not on file  Lifestyle  . Physical activity:    Days per week: Not on file    Minutes per session: Not on file  . Stress: Not on file  Relationships  . Social connections:    Talks on phone: Not on file    Gets  together: Not on file    Attends religious service: Not on file    Active member of club or organization: Not on file    Attends meetings of clubs or organizations: Not on file    Relationship status: Not on file  Other Topics Concern  . Not on file  Social History Narrative   Patient lives alone.     Family History:  The patient's family history includes Coronary artery disease in her brother; Hypertension in her mother and sister.   ROS:   Please see the history of present illness.    ROS all other systems are reviewed and are negative   PHYSICAL EXAM:   VS:  BP (!) 128/58   Pulse 74   Ht 5\' 3"  (1.6 m)   Wt 289 lb 6.4 oz (131.3 kg)   BMI 51.26 kg/m      General: Alert, oriented x3, no distress, super obesity limits her exam Head: no evidence of trauma, PERRL, EOMI, no exophtalmos or lid lag, no myxedema, no xanthelasma; normal ears, nose and oropharynx Neck: normal jugular venous pulsations and no hepatojugular reflux; brisk carotid pulses without delay and no carotid bruits Chest: clear to auscultation, no signs of consolidation by percussion or palpation, normal fremitus, symmetrical and full respiratory excursions Cardiovascular: normal position and quality of the apical impulse, regular rhythm, normal first and second heart sounds, no murmurs, rubs or gallops Abdomen: no tenderness or distention, no masses by palpation, no abnormal pulsatility or arterial bruits, normal bowel sounds, no hepatosplenomegaly Extremities: no clubbing, cyanosis or edema; 2+ radial, ulnar and brachial pulses bilaterally; 2+ right femoral, posterior tibial and dorsalis pedis pulses; 2+ left femoral, posterior tibial and dorsalis pedis pulses; no subclavian or femoral bruits Neurological: grossly nonfocal Psych: Normal mood and affect    Wt Readings from Last 3 Encounters:  07/10/18 289 lb 6.4 oz (131.3 kg)  04/07/18 277 lb (125.6 kg)  03/06/18 277 lb 1.9 oz (125.7 kg)  Studies/Labs  Reviewed:   EKG:  EKG is ordered today.  Shows normal sinus rhythm with old right bundle branch block, probably LVH with repolarization changes, QRS 160 ms, QTC 472 ms  Recent Labs: 10/19/2016 Potassium 3.9, creatinine 1.0, normal LFTs, normal TSH, hemoglobin 10.6 (02/06/2016 BMET    Component Value Date/Time   NA 133 (L) 09/23/2017 2016   NA 138 08/30/2017 1607   NA 137 09/15/2015 0959   K 3.5 09/23/2017 2016   K 4.0 09/15/2015 0959   CL 95 (L) 09/23/2017 2016   CL 99 09/12/2012 0959   CO2 27 09/23/2017 2016   CO2 27 09/15/2015 0959   GLUCOSE 240 (H) 09/23/2017 2016   GLUCOSE 206 (H) 09/15/2015 0959   GLUCOSE 217 (H) 09/12/2012 0959   BUN 20 09/23/2017 2016   BUN 22 08/30/2017 1607   BUN 16.1 09/15/2015 0959   CREATININE 1.20 (H) 09/23/2017 2016   CREATININE 1.03 (H) 01/30/2016 1058   CREATININE 1.2 (H) 09/15/2015 0959   CALCIUM 9.3 09/23/2017 2016   CALCIUM 9.9 09/15/2015 0959   GFRNONAA 46 (L) 09/23/2017 2016   GFRAA 53 (L) 09/23/2017 2016    Lipid Panel    Component Value Date/Time   CHOL 133 08/10/2017 1141   TRIG 78 08/10/2017 1141   HDL 46 08/10/2017 1141   CHOLHDL 2.9 08/10/2017 1141   VLDL 16 08/10/2017 1141   LDLCALC 71 08/10/2017 1141   LDLDIRECT 143 (H) 10/10/2008 2042   10/19/2016 LDL 64, HDL 49, total cholesterol 130, triglycerides 83   ASSESSMENT:    1. Chronic diastolic heart failure (HCC)   2. Paroxysmal atrial fibrillation ([place])   3. Coronary artery disease involving native coronary artery of native heart without angina pectoris   4. Essential hypertension   5. Diabetes mellitus type 2 in obese ([place])   6. Hypercholesterolemia   7. Super obese   8. PAD (peripheral artery disease) ([place])   9. CKD (chronic kidney disease) stage 3, GFR 30-59 ml/min (HCC)      PLAN:  In order of problems listed above:  1. CHF: Clinically euvolemic, without overt evidence of volume overload, although her exam is very challenging. 2. PAF: No clinically  evident episodes of atrial fibrillation in the last several months.  This diagnosis precedes the bypass procedure.  CHADSVasc 6 (age, gender, DM, CHF, CAD/PAD, HTN). 3. CAD s/p CABG: Asymptomatic approximately 1 year after single-vessel LIMA bypass to the LAD.  Has completed cardiac rehab.  Discussed ways to continue exercising.  She is planning to go to the pool or Silver sneakers. 4. HTN: Excellent control 5. DM: Followed by Dr. Ashby Dawes, she is on a sulfonylurea and I still think it might be a good idea to consider SGLT2 inhibitors for their positive cardiovascular profile. 6. HLP: LDL at target under 70 on statin 7. Obesity: Obviously this has a lot to do with her poor glycemic control and insulin resistance.  I think SGLT2 inhibitors might assist with weight loss. 8. PAD: Per the level of activity that she can perform she does not develop claudication 9. CKD 3: Request updated labs   Medication Adjustments/Labs and Tests Ordered: Current medicines are reviewed at length with the patient today.  Concerns regarding medicines are outlined above.  Medication changes, Labs and Tests ordered today are listed in the Patient Instructions below. Patient Instructions  Medication Instructions:  Dr Sallyanne Kuster recommends that you continue on your current medications as directed. Please refer to the Current Medication list given to  you today.  If you need a refill on your cardiac medications before your next appointment, please call your pharmacy.   Lab work: NONE ORDERED  If you have labs (blood work) drawn today and your tests are completely normal, you will receive your results only by: Marland Kitchen MyChart Message (if you have MyChart) OR . A paper copy in the mail If you have any lab test that is abnormal or we need to change your treatment, we will call you to review the results.  Testing/Procedures: NONE ORDERED  Follow-Up: At [facility], you and your health needs are our priority.  As part of  our continuing mission to provide you with exceptional heart care, we have created designated Provider Care Teams.  These Care Teams include your primary Cardiologist (physician) and Advanced Practice Providers (APPs -  Physician Assistants and Nurse Practitioners) who all work together to provide you with the care you need, when you need it. You will need a follow up appointment in 12 months.  Please call our office 2 months in advance to schedule this appointment.  You may see Sanda Klein, MD or one of the following Advanced Practice Providers on your designated Care Team: [place], [place] . Fabian Sharp, PA-C    Signed, Sanda Klein, MD  07/15/2018 12:21 PM    [facility] [address], [place], [place]  [address] Phone: [phone]; Fax: [phone]

## 2018-07-10 NOTE — Patient Instructions (Signed)
Medication Instructions:  Dr Sallyanne Kuster recommends that you continue on your current medications as directed. Please refer to the Current Medication list given to you today.  If you need a refill on your cardiac medications before your next appointment, please call your pharmacy.   Lab work: NONE ORDERED  If you have labs (blood work) drawn today and your tests are completely normal, you will receive your results only by: Marland Kitchen MyChart Message (if you have MyChart) OR . A paper copy in the mail If you have any lab test that is abnormal or we need to change your treatment, we will call you to review the results.  Testing/Procedures: NONE ORDERED  Follow-Up: At [facility], you and your health needs are our priority.  As part of our continuing mission to provide you with exceptional heart care, we have created designated Provider Care Teams.  These Care Teams include your primary Cardiologist (physician) and Advanced Practice Providers (APPs -  Physician Assistants and Nurse Practitioners) who all work together to provide you with the care you need, when you need it. You will need a follow up appointment in 12 months.  Please call our office 2 months in advance to schedule this appointment.  You may see Sanda Klein, MD or one of the following Advanced Practice Providers on your designated Care Team: [place], [place] . Fabian Sharp, PA-C

## 2018-07-15 ENCOUNTER — Encounter: Payer: Self-pay | Admitting: Cardiovascular Disease

## 2018-07-22 ENCOUNTER — Other Ambulatory Visit: Payer: Self-pay | Admitting: Cardiovascular Disease

## 2018-07-24 NOTE — Telephone Encounter (Signed)
Rx request sent to pharmacy.  

## 2018-07-25 ENCOUNTER — Other Ambulatory Visit: Payer: Self-pay | Admitting: Cardiovascular Disease

## 2018-08-03 ENCOUNTER — Ambulatory Visit (INDEPENDENT_AMBULATORY_CARE_PROVIDER_SITE_OTHER)
Admission: RE | Admit: 2018-08-03 | Discharge: 2018-08-03 | Disposition: A | Discharge status: Home / Self Care | Payer: Medicare HMO | Source: Ambulatory Visit | Attending: Emergency Medicine | Admitting: Emergency Medicine

## 2018-08-03 DIAGNOSIS — R911 Solitary pulmonary nodule: Secondary | ICD-10-CM | POA: Diagnosis not present

## 2018-08-03 DIAGNOSIS — R918 Other nonspecific abnormal finding of lung field: Secondary | ICD-10-CM | POA: Diagnosis not present

## 2018-08-15 ENCOUNTER — Ambulatory Visit (INDEPENDENT_AMBULATORY_CARE_PROVIDER_SITE_OTHER): Payer: Medicare HMO | Admitting: Pharmacist

## 2018-08-15 DIAGNOSIS — E1065 Type 1 diabetes mellitus with hyperglycemia: Secondary | ICD-10-CM | POA: Diagnosis not present

## 2018-08-15 DIAGNOSIS — Z7901 Long term (current) use of anticoagulants: Secondary | ICD-10-CM

## 2018-08-15 DIAGNOSIS — D500 Iron deficiency anemia secondary to blood loss (chronic): Secondary | ICD-10-CM | POA: Diagnosis not present

## 2018-08-15 DIAGNOSIS — I2510 Atherosclerotic heart disease of native coronary artery without angina pectoris: Secondary | ICD-10-CM | POA: Diagnosis not present

## 2018-08-15 DIAGNOSIS — E10319 Type 1 diabetes mellitus with unspecified diabetic retinopathy without macular edema: Secondary | ICD-10-CM | POA: Diagnosis not present

## 2018-08-15 DIAGNOSIS — I480 Paroxysmal atrial fibrillation: Secondary | ICD-10-CM | POA: Diagnosis not present

## 2018-08-15 LAB — POCT INR: INR: 2.4 (ref 2.0–3.0)

## 2018-08-21 DIAGNOSIS — I5032 Chronic diastolic (congestive) heart failure: Secondary | ICD-10-CM | POA: Diagnosis not present

## 2018-08-21 DIAGNOSIS — I2510 Atherosclerotic heart disease of native coronary artery without angina pectoris: Secondary | ICD-10-CM | POA: Diagnosis not present

## 2018-08-21 DIAGNOSIS — I129 Hypertensive chronic kidney disease with stage 1 through stage 4 chronic kidney disease, or unspecified chronic kidney disease: Secondary | ICD-10-CM | POA: Diagnosis not present

## 2018-08-21 DIAGNOSIS — E1065 Type 1 diabetes mellitus with hyperglycemia: Secondary | ICD-10-CM | POA: Diagnosis not present

## 2018-08-21 DIAGNOSIS — D509 Iron deficiency anemia, unspecified: Secondary | ICD-10-CM | POA: Diagnosis not present

## 2018-08-21 DIAGNOSIS — E782 Mixed hyperlipidemia: Secondary | ICD-10-CM | POA: Diagnosis not present

## 2018-08-29 ENCOUNTER — Encounter: Payer: Self-pay | Admitting: Emergency Medicine

## 2018-08-29 ENCOUNTER — Ambulatory Visit: Payer: Medicare HMO | Admitting: Emergency Medicine

## 2018-08-29 DIAGNOSIS — R911 Solitary pulmonary nodule: Secondary | ICD-10-CM | POA: Diagnosis not present

## 2018-08-29 NOTE — Progress Notes (Signed)
Subjective:    Patient ID: [name], female    DOB: Jun 23, 1951, 67 y.o.   MRN: [id number]  HPI 67 year old former smoker (20 pk-yrs) gt with a history of obesity, coronary disease, GERD with a hiatal hernia, atrial fibrillation, untreated obstructive sleep apnea, diabetes.  She has a history of left upper lobe lobectomy 2010 for stage I a non-small cell lung cancer, has been seen by Dr Gwenette Greet before, surgery by Dr Arlyce Dice. She underwent single vessel CABG in 07/2017 w Dr Roxan Hockey.   She underwent a cardiac CT prior to her CABG 07/2017 that I reviewed.  The lung windows show some bibasilar atelectasis and a 2 mm right lower lobe nodule.    ROV 08/29/18 --this is a follow-up visit for patient with history of tobacco use, obesity, coronary disease/CABG, GERD, held hernia, A. fib, untreated sleep apnea, diabetes as outlined above.  Importantly she has a history of a left upper lobe lobectomy in 2010 for stage Ia non-small cell lung cancer.  She is noted to have a noncalcified 2 mm right lower lobe nodule on cardiac CT scan.  Given her history of non-small cell lung cancer I recommended follow-up scanning.  This was done on 08/03/2018 and I have reviewed.  The 2 mm right lower lobe nodule is completely unchanged in size or appearance there are no other nodules seen.  There was some bilateral lower lobe atelectasis. She has stable exertional SOB. She benefited from cardiac rehab.    Review of Systems  Constitutional: Negative for fever and unexpected weight change.  HENT: Negative for congestion, dental problem, ear pain, nosebleeds, postnasal drip, rhinorrhea, sinus pressure, sneezing, sore throat and trouble swallowing.   Eyes: Negative for redness and itching.  Respiratory: Positive for shortness of breath. Negative for cough, chest tightness and wheezing.   Cardiovascular: Positive for palpitations. Negative for leg swelling.  Gastrointestinal: Negative for nausea and vomiting.    Genitourinary: Negative for dysuria.  Musculoskeletal: Negative for joint swelling.  Skin: Negative for rash.  Neurological: Negative for headaches.  Hematological: Does not bruise/bleed easily.  Psychiatric/Behavioral: Negative for dysphoric mood. The patient is not nervous/anxious.    Past Medical History:  Diagnosis Date  . Arthritis    "knees" (02/05/2016)  . Cancer ([place]) 5/10   [facility]   . Coronary artery disease   . GERD (gastroesophageal reflux disease)    tx meds  . H/O hiatal hernia   . History of blood transfusion "several"   last april 2015 s/p knee surgery (02/05/2016)  . Hypercholesteremia   . Hypertension   . Lung cancer ([place])    2010, surgery 18% left lung no radiation or chemo  . Non-STEMI (non-ST elevated myocardial infarction) ([place]) 04/07/2012   See cath results below  . Paroxysmal atrial fibrillation ([place]) 04/07/2012   On Warfarin  . Peripheral vascular disease ([place]) 8/12   Lt SFA PTA  . Presence of stent in LAD coronary artery 04/07/2012   Xience [employer]ition DES 2.75 mm x 18 mm (dilated to 3.0 mm)  . Sleep apnea    does not wear CPAP  . Type II diabetes mellitus ([facility])    insulin dependent     Family History  Problem Relation Age of Onset  . Hypertension Mother   . Coronary artery disease Brother   . Hypertension Sister      Social History   Socioeconomic History  . Marital status: Single    Spouse name: Not on file  . Number of  children: Not on file  . Years of education: Not on file  . Highest education level: Not on file  Occupational History  . Occupation: Retired  Scientific laboratory technician  . Financial resource strain: Not on file  . Food insecurity:    Worry: Not on file    Inability: Not on file  . Transportation needs:    Medical: Not on file    Non-medical: Not on file  Tobacco Use  . Smoking status: Former Smoker    Packs/day: 1.00    Years: 20.00    Pack years: 20.00    Types: Cigarettes    Last attempt to quit: 09/27/1984    Years  since quitting: 33.9  . Smokeless tobacco: Never Used  Substance and Sexual Activity  . Alcohol use: No  . Drug use: No  . Sexual activity: Not on file  Lifestyle  . Physical activity:    Days per week: Not on file    Minutes per session: Not on file  . Stress: Not on file  Relationships  . Social connections:    Talks on phone: Not on file    Gets together: Not on file    Attends religious service: Not on file    Active member of club or organization: Not on file    Attends meetings of clubs or organizations: Not on file    Relationship status: Not on file  . Intimate partner violence:    Fear of current or ex partner: Not on file    Emotionally abused: Not on file    Physically abused: Not on file    Forced sexual activity: Not on file  Other Topics Concern  . Not on file  Social History Narrative   Patient lives alone.  worked Orthoptist, the [employer] [place]   Allergies  Allergen Reactions  . Levofloxacin Itching  . Morphine And Related Itching    Pt reports oxycodone history with no allergy.     Outpatient Medications Prior to Visit  Medication Sig Dispense Refill  . aspirin EC 81 MG tablet Take 81 mg by mouth daily.    . Fe Fum-FePoly-Vit C-Vit B3 (INTEGRA) 62.5-62.5-40-3 MG CAPS Take 1 capsule by mouth daily.    . furosemide (LASIX) 40 MG tablet Take 1 tablet (40 mg total) by mouth daily. (Patient taking differently: Take 40 mg by mouth 3 (three) times a week. Take 40 mg Monday, Wednesday, and Friday each week.) 90 tablet 3  . glimepiride (AMARYL) 2 MG tablet Take 4 mg by mouth daily with breakfast.     . insulin NPH-regular Human (NOVOLIN 70/30) (70-30) 100 UNIT/ML injection Inject 30-34 Units into the skin 2 (two) times daily with a meal. Prior to surgery, she took 42 units in the morning 20 units in the evening. As of 08/23/2017, she is on 20 units in am and 20 units in the pm. Her Insulin gradually needs to be increased to pre surgery doses as  glucose levels allow. (Patient taking differently: Inject 20 Units into the skin 2 (two) times daily before a meal. 50 qam, 22 qpm) 10 mL 11  . lisinopril-hydrochlorothiazide (PRINZIDE,ZESTORETIC) 20-25 MG per tablet Take 1 tablet by mouth daily.    . metoprolol tartrate (LOPRESSOR) 25 MG tablet TAKE 1 TABLET BY MOUTH TWICE A DAY 180 tablet 3  . pantoprazole (PROTONIX) 40 MG tablet Take 1 tablet (40 mg total) by mouth daily. Take 40 mg by mouth once a day 90 tablet 1  .  potassium chloride (MICRO-K) 10 MEQ CR capsule Take 1 capsule (10 mEq total) by mouth daily. 30 capsule 6  . simvastatin (ZOCOR) 40 MG tablet TAKE 1 TABLET BY MOUTH EVERYDAY AT BEDTIME 90 tablet 0  . Vitamin D, Cholecalciferol, 1000 units TABS Take 1,000 Units by mouth every morning.     . metoprolol tartrate (LOPRESSOR) 25 MG tablet Take 1 tablet (25 mg total) by mouth 3 (three) times daily. 60 tablet 11  . warfarin (COUMADIN) 7.5 MG tablet TAKE 1/2 TO 1 TABLET DAILY AS DIRECTED BY COUMADIN CLINIC (Patient taking differently: Take 1/2 on M/W/F, 1 tablet T/T/S/S) 90 tablet 0  . fluticasone (FLONASE) 50 MCG/ACT nasal spray Place 2 sprays into both nostrils daily.     . NONFORMULARY OR COMPOUNDED ITEM Med Name: Anti-Inflammatory Cream: Diclofenac 3%/ Baclofen 2%/ Cyclobenzaprine 2%/ Lidocaine 2%    . warfarin (COUMADIN) 7.5 MG tablet Take 7.5 mg by mouth daily. 7.5 mg in the evening on Sun/Tues/Thurs/Sat and 3.75 mg on Mon/Wed/Fri     No facility-administered medications prior to visit.         Objective:   Physical Exam Vitals:   08/29/18 1128  BP: (!) 146/62  Pulse: 74  SpO2: 99%  Weight: 290 lb (131.5 kg)  Height: 5\' 3"  (1.6 m)   Gen: Pleasant, obese woman, in no distress,  normal affect  ENT: No lesions,  mouth clear,  oropharynx clear, no postnasal drip  Neck: No JVD, no stridor  Lungs: No use of accessory muscles, clear bilaterally  Cardiovascular: RRR, heart sounds normal, no murmur or gallops, no peripheral  edema  Musculoskeletal: No deformities, no cyanosis or clubbing  Neuro: alert, non focal  Skin: Warm, no lesions or rash      Assessment & Plan:  Pulmonary nodule/lesion, solitary 2 mm pulmonary nodule which I decided to follow despite its small size given her history of non-small cell lung cancer.  The nodules completely unchanged on the most recent CT I think we can forego any further scans unless she clinically changes.  She will already be screened with annual chest x-rays given her history of lung cancer.  Baltazar Apo, MD, PhD 08/29/2018, 11:50 AM [facility] [phone] or if no answer [phone]

## 2018-08-29 NOTE — Assessment & Plan Note (Signed)
2 mm pulmonary nodule which I decided to follow despite its small size given her history of non-small cell lung cancer.  The nodules completely unchanged on the most recent CT I think we can forego any further scans unless she clinically changes.  She will already be screened with annual chest x-rays given her history of lung cancer.

## 2018-08-29 NOTE — Patient Instructions (Signed)
Your CT scan of the chest shows that your very small right lower lobe pulmonary nodule is unchanged in size or appearance.  This is good news.  There is no evidence for recurrent lung cancer. Follow-up with Dr. Lamonte Sakai in 1 year or sooner if you have any changes in your breathing or other problems.

## 2018-08-31 ENCOUNTER — Ambulatory Visit: Payer: Medicare HMO | Admitting: Podiatry

## 2018-09-05 ENCOUNTER — Ambulatory Visit: Payer: Medicare HMO | Admitting: Podiatry

## 2018-09-05 ENCOUNTER — Encounter: Payer: Self-pay | Admitting: Podiatry

## 2018-09-05 VITALS — BP 134/71

## 2018-09-05 DIAGNOSIS — M79674 Pain in right toe(s): Secondary | ICD-10-CM | POA: Diagnosis not present

## 2018-09-05 DIAGNOSIS — E1151 Type 2 diabetes mellitus with diabetic peripheral angiopathy without gangrene: Secondary | ICD-10-CM | POA: Diagnosis not present

## 2018-09-05 DIAGNOSIS — B351 Tinea unguium: Secondary | ICD-10-CM

## 2018-09-05 DIAGNOSIS — M79675 Pain in left toe(s): Secondary | ICD-10-CM | POA: Diagnosis not present

## 2018-09-05 DIAGNOSIS — L84 Corns and callosities: Secondary | ICD-10-CM | POA: Diagnosis not present

## 2018-09-05 NOTE — Patient Instructions (Addendum)
Diabetes and Foot Care Diabetes may cause you to have problems because of poor blood supply (circulation) to your feet and legs. This may cause the skin on your feet to become thinner, break easier, and heal more slowly. Your skin may become dry, and the skin may peel and crack. You may also have nerve damage in your legs and feet causing decreased feeling in them. You may not notice minor injuries to your feet that could lead to infections or more serious problems. Taking care of your feet is one of the most important things you can do for yourself. Follow these instructions at home:  Wear shoes at all times, even in the house. Do not go barefoot. Bare feet are easily injured.  Check your feet daily for blisters, cuts, and redness. If you cannot see the bottom of your feet, use a mirror or ask someone for help.  Wash your feet with warm water (do not use hot water) and mild soap. Then pat your feet and the areas between your toes until they are completely dry. Do not soak your feet as this can dry your skin.  Apply a moisturizing lotion or petroleum jelly (that does not contain alcohol and is unscented) to the skin on your feet and to dry, brittle toenails. Do not apply lotion between your toes.  Trim your toenails straight across. Do not dig under them or around the cuticle. File the edges of your nails with an emery board or nail file.  Do not cut corns or calluses or try to remove them with medicine.  Wear clean socks or stockings every day. Make sure they are not too tight. Do not wear knee-high stockings since they may decrease blood flow to your legs.  Wear shoes that fit properly and have enough cushioning. To break in new shoes, wear them for just a few hours a day. This prevents you from injuring your feet. Always look in your shoes before you put them on to be sure there are no objects inside.  Do not cross your legs. This may decrease the blood flow to your feet.  If you find a  minor scrape, cut, or break in the skin on your feet, keep it and the skin around it clean and dry. These areas may be cleansed with mild soap and water. Do not cleanse the area with peroxide, alcohol, or iodine.  When you remove an adhesive bandage, be sure not to damage the skin around it.  If you have a wound, look at it several times a day to make sure it is healing.  Do not use heating pads or hot water bottles. They may burn your skin. If you have lost feeling in your feet or legs, you may not know it is happening until it is too late.  Make sure your health care provider performs a complete foot exam at least annually or more often if you have foot problems. Report any cuts, sores, or bruises to your health care provider immediately. Contact a health care provider if:  You have an injury that is not healing.  You have cuts or breaks in the skin.  You have an ingrown nail.  You notice redness on your legs or feet.  You feel burning or tingling in your legs or feet.  You have pain or cramps in your legs and feet.  Your legs or feet are numb.  Your feet always feel cold. Get help right away if:  There is increasing   redness, swelling, or pain in or around a wound.  There is a red line that goes up your leg.  Pus is coming from a wound.  You develop a fever or as directed by your health care provider.  You notice a bad smell coming from an ulcer or wound. This information is not intended to replace advice given to you by your health care provider. Make sure you discuss any questions you have with your health care provider. Document Released: 09/10/2000 Document Revised: 02/19/2016 Document Reviewed: 02/20/2013 Elsevier Interactive Patient Education  2017 [place] are small areas of thickened skin that occur on the top, sides, or tip of a toe. They contain a cone-shaped core with a point that can press on a nerve below. This causes pain.  Calluses are areas of thickened skin that can occur anywhere on the body including hands, fingers, palms, soles of the feet, and heels.Calluses are usually larger than corns. What are the causes? Corns and calluses are caused by rubbing (friction) or pressure, such as from shoes that are too tight or do not fit properly. What increases the risk? Corns are more likely to develop in people who have toe deformities, such as hammer toes. Since calluses can occur with friction to any area of the skin, calluses are more likely to develop in people who: Work with their hands. Wear shoes that fit poorly, shoes that are too tight, or shoes that are high-heeled. Have toes deformities.  What are the signs or symptoms? Symptoms of a corn or callus include: A hard growth on the skin. Pain or tenderness under the skin. Redness and swelling. Increased discomfort while wearing tight-fitting shoes.  How is this diagnosed? Corns and calluses may be diagnosed with a medical history and physical exam. How is this treated? Corns and calluses may be treated with: Removing the cause of the friction or pressure. This may include: Changing your shoes. Wearing shoe inserts (orthotics) or other protective layers in your shoes, such as a corn pad. Wearing gloves. Medicines to help soften skin in the hardened, thickened areas. Reducing the size of the corn or callus by removing the dead layers of skin. Antibiotic medicines to treat infection. Surgery, if a toe deformity is the cause.  Follow these instructions at home: Take medicines only as directed by your health care provider. If you were prescribed an antibiotic, finish all of it even if you start to feel better. Wear shoes that fit well. Avoid wearing high-heeled shoes and shoes that are too tight or too loose. Wear any padding, protective layers, gloves, or orthotics as directed by your health care provider. Soak your hands or feet and then use a file  or pumice stone to soften your corn or callus. Do this as directed by your health care provider. Check your corn or callus every day for signs of infection. Watch for: Redness, swelling, or pain. Fluid, blood, or pus. Contact a health care provider if: Your symptoms do not improve with treatment. You have increased redness, swelling, or pain at the site of your corn or callus. You have fluid, blood, or pus coming from your corn or callus. You have new symptoms. This information is not intended to replace advice given to you by your health care provider. Make sure you discuss any questions you have with your health care provider. Document Released: 06/19/2004 Document Revised: 04/02/2016 Document Reviewed: 09/09/2014 Elsevier Interactive Patient Education  [employer].

## 2018-09-12 DIAGNOSIS — E1065 Type 1 diabetes mellitus with hyperglycemia: Secondary | ICD-10-CM | POA: Diagnosis not present

## 2018-09-26 ENCOUNTER — Telehealth: Payer: Self-pay

## 2018-09-26 NOTE — Telephone Encounter (Signed)
Called to let pt know that the appt for 1/10 needs to be rescheduled due to pharm d being summoned to a meeting however the pt doesn't have a voicemail set up.

## 2018-10-04 ENCOUNTER — Other Ambulatory Visit: Payer: Self-pay | Admitting: Cardiovascular Disease

## 2018-10-07 ENCOUNTER — Encounter: Payer: Self-pay | Admitting: Podiatry

## 2018-10-07 NOTE — [place] Notes (Signed)
Subjective: [name] presents today referred by [name], MD with cc of painful, discolored, thick toenails which interfere with daily activities. Duration has been for a couple of months.   Pain is aggravated when wearing enclosed shoe gear. Pain is relieved with periodic professional debridement. She has had podiatric care in the past with Dr. Caffie Pinto in [facility].  Past Medical History:  Diagnosis Date  . Arthritis    "knees" (02/05/2016)  . Cancer ([place]) 5/10   LUL Vats   . Coronary artery disease   . GERD (gastroesophageal reflux disease)    tx meds  . H/O hiatal hernia   . History of blood transfusion "several"   last april 2015 s/p knee surgery (02/05/2016)  . Hypercholesteremia   . Hypertension   . Lung cancer ([place])    2010, surgery 18% left lung no radiation or chemo  . Non-STEMI (non-ST elevated myocardial infarction) ([place]) 04/07/2012   See cath results below  . Paroxysmal atrial fibrillation ([place]) 04/07/2012   On Warfarin  . Peripheral vascular disease ([place]) 8/12   Lt SFA PTA  . Presence of stent in LAD coronary artery 04/07/2012   Xience Expedition DES 2.75 mm x 18 mm (dilated to 3.0 mm)  . Sleep apnea    does not wear CPAP  . Type II diabetes mellitus (HCC)    insulin dependent     Patient Active Problem List   Diagnosis Date Noted  . Hypercholesterolemia 01/11/2018  . Diabetes mellitus type 2 in obese ([place]) 01/11/2018  . Pulmonary nodule/lesion, solitary 11/23/2017  . RBBB 10/31/2017  . S/P CABG x 1 08/17/2017  . Abnormal nuclear stress test 02/05/2016  . Chest pain with high risk for cardiac etiology 02/05/2016  . CAD S/P percutaneous coronary angioplasty 02/05/2016  . GERD (gastroesophageal reflux disease) 01/06/2016  . CKD (chronic kidney disease) stage 3, GFR 30-59 ml/min (HCC) 01/06/2016  . Chronic diastolic heart failure ([place]) 06/28/2015  . Esophageal reflux 02/15/2014  . Anemia 01/17/2014  . S/P left TK revision 12/31/2013  . Knee  osteomyelitis, left ([place]) 11/21/2013  . Foreign body of knee with infection 11/18/2013  . Unspecified constipation 11/07/2013  . Expected blood loss anemia 11/06/2013  . Left knee spacer, following removal of joint prosthesis 11/05/2013  . DM (diabetes mellitus), type 2 with peripheral vascular complications ([place]) 16/06/9603  . Onychomycosis 10/23/2013  . Pain, foot 10/23/2013  . DJD (degenerative joint disease) of knee 04/10/2013  . Vaginal atrophy 01/01/2013  . OAB (overactive bladder) 01/01/2013  . Osteopenia 01/01/2013  . Chest pain 11/28/2012  . Palpitations 11/22/2012  . Dyslipidemia 11/22/2012  . Chronic anticoagulation 11/22/2012  . Super obese 07/11/2012  . Trimalleolar fracture 07/07/2012  . Coronary artery disease involving native coronary artery of native heart without angina pectoris 04/08/2012  . ICM, EF 40-45% cath 04/07/12- improved to 50-60% by echo Oct 2013 04/08/2012  . Paroxysmal atrial fibrillation ([place]) 04/07/2012  . NSTEMI (non-ST elevated myocardial infarction)- 7/13 04/07/2012  . Unstable angina ([place]) 04/07/2012  . PVD (peripheral vascular disease), Lt SFA PTA Aug 2012 04/07/2012  . Cancer of left lung parenchyma ([place]) 01/13/2009  . Insulin dependent diabetes mellitus ([place]) 11/27/2008  . Essential hypertension 11/27/2008     Past Surgical History:  Procedure Laterality Date  . ABDOMINAL HYSTERECTOMY  1990  . BREAST EXCISIONAL BIOPSY Right   . CARDIAC CATHETERIZATION  11/04/2008   patent RCA, LM, and Circ, nl EF  . CARDIAC CATHETERIZATION  02/20/2002   patent coronaries  with the only abnormality being a smooth luminla irregularity in the mid intermediate ramus branch no felt to be hemodynamically significant, nl LV  . CARDIAC CATHETERIZATION N/A 02/05/2016   Procedure: Left Heart Cath and Coronary Angiography;  Surgeon: Leonie Man, MD;  Location: [facility];  Service: Cardiovascular;  Laterality: N/A;  . CARDIAC CATHETERIZATION N/A 02/05/2016    Procedure: Coronary Stent Intervention;  Surgeon: Leonie Man, MD;  Location: [facility];  Service: Cardiovascular;  Laterality: N/A;  . CATARACT EXTRACTION W/ INTRAOCULAR LENS  IMPLANT, BILATERAL  2013  . CORONARY ANGIOPLASTY WITH STENT PLACEMENT  04/07/2012   Xience Expedition DES 2.7mm x 18 mm (dilated to 3.0 mm) to the prox LAD   . CORONARY ANGIOPLASTY WITH STENT PLACEMENT  2013; 2015  . CORONARY ARTERY BYPASS GRAFT N/A 08/17/2017   Procedure: CORONARY ARTERY BYPASS GRAFTING (CABG) TIMES 1 USING LEFT INTERNAL MAMMARY ARTERY;  Surgeon: Melrose Nakayama, MD;  Location: [facility];  Service: Open Heart Surgery;  Laterality: N/A;  . DILATION AND CURETTAGE OF UTERUS  <1990  . ESOPHAGOGASTRODUODENOSCOPY N/A 05/23/2015   Procedure: ESOPHAGOGASTRODUODENOSCOPY (EGD);  Surgeon: Carol Ada, MD;  Location: [facility];  Service: Endoscopy;  Laterality: N/A;  . ESOPHAGOGASTRODUODENOSCOPY (EGD) WITH PROPOFOL N/A 06/13/2015   Procedure: ESOPHAGOGASTRODUODENOSCOPY (EGD) WITH PROPOFOL;  Surgeon: Carol Ada, MD;  Location: [facility];  Service: Endoscopy;  Laterality: N/A;  . ESOPHAGOGASTRODUODENOSCOPY (EGD) WITH PROPOFOL N/A 04/07/2018   Procedure: ESOPHAGOGASTRODUODENOSCOPY (EGD) WITH PROPOFOL;  Surgeon: Carol Ada, MD;  Location: [facility];  Service: Endoscopy;  Laterality: N/A;  fluoroscopy is needed  . FRACTURE SURGERY    . HAMMER TOE SURGERY Bilateral 1993   with screws, on screw removed  . JOINT REPLACEMENT    . KNEE ARTHROSCOPY Bilateral    left x2, right x1  . LAPAROSCOPIC CHOLECYSTECTOMY    . LEFT HEART CATH AND CORONARY ANGIOGRAPHY N/A 08/11/2017   Procedure: LEFT HEART CATH AND CORONARY ANGIOGRAPHY;  Surgeon: Martinique, Peter M, MD;  Location: [facility];  Service: Cardiovascular;  Laterality: N/A;  . LEFT HEART CATHETERIZATION WITH CORONARY ANGIOGRAM N/A 04/07/2012   Procedure: LEFT HEART CATHETERIZATION WITH CORONARY ANGIOGRAM;  Surgeon: [name], MD;   Location: [facility];  Service: Cardiovascular;  Laterality: N/A;  . LEFT HEART CATHETERIZATION WITH CORONARY ANGIOGRAM N/A 05/27/2014   Procedure: LEFT HEART CATHETERIZATION WITH CORONARY ANGIOGRAM;  Surgeon: [name], MD; LAD 99% ISR, CFX 50-60%, RCA (dominant) no sig dz, EF 60%  . LOWER EXTREMITY ANGIOGRAM  05/17/11   directional atherectomy to the prox L SFA using a LX Man TurboHawk, ballooned with a [employer] balloon   . ORIF ANKLE FRACTURE  07/09/2012   Procedure: OPEN REDUCTION INTERNAL FIXATION (ORIF) ANKLE FRACTURE;  Surgeon: Meredith Pel, MD;  Location: [facility];  Service: Orthopedics;  Laterality: Left;  open reduction internal fixation trimalleolar ankle fracture medial malleolous fixation  . PERCUTANEOUS CORONARY STENT INTERVENTION (PCI-S) N/A 04/07/2012   Procedure: PERCUTANEOUS CORONARY STENT INTERVENTION (PCI-S);  Surgeon: [name], MD;  Location: [facility];  Service: Cardiovascular;  Laterality: N/A;  . PERCUTANEOUS CORONARY STENT INTERVENTION (PCI-S)  05/27/2014   Procedure: PERCUTANEOUS CORONARY STENT INTERVENTION (PCI-S);  Surgeon: [name], MD; 3 mm x 12 mm long Xience Xpedition DES to the proximal LAD  . SAVORY DILATION N/A 06/13/2015   Procedure: SAVORY DILATION;  Surgeon: Carol Ada, MD;  Location: [facility];  Service: Endoscopy;  Laterality: N/A;  .  SAVORY DILATION N/A 04/07/2018   Procedure: SAVORY DILATION;  Surgeon: Carol Ada, MD;  Location: [facility];  Service: Endoscopy;  Laterality: N/A;  . TEE WITHOUT CARDIOVERSION N/A 08/17/2017   Procedure: TRANSESOPHAGEAL ECHOCARDIOGRAM (TEE);  Surgeon: Melrose Nakayama, MD;  Location: [facility];  Service: Open Heart Surgery;  Laterality: N/A;  . TONSILLECTOMY    . TOTAL KNEE ARTHROPLASTY Left 2003  . TOTAL KNEE REVISION Left 11/05/2013   Procedure: LEFT TOTAL KNEE RESECTION;  Surgeon: Mauri Pole, MD;  Location: [facility];  Service: Orthopedics;  Laterality: Left;  . TOTAL KNEE REVISION Left  2007   opened in 2006 and cleaned out, 2007 revision  . TOTAL KNEE REVISION Left 12/31/2013   Procedure: RE-INPLANTATION LEFT TOTAL KNEE ;  Surgeon: Mauri Pole, MD;  Location: [facility];  Service: Orthopedics;  Laterality: Left;  Marland Kitchen VIDEO ASSISTED THORACOSCOPY (VATS)/ LOBECTOMY  01/2009      Current Outpatient Medications:  .  aspirin EC 81 MG tablet, Take 81 mg by mouth daily., Disp: , Rfl:  .  Fe Fum-FePoly-Vit C-Vit B3 (INTEGRA) 62.5-62.5-40-3 MG CAPS, Take 1 capsule by mouth daily., Disp: , Rfl:  .  furosemide (LASIX) 40 MG tablet, Take 1 tablet (40 mg total) by mouth daily. (Patient taking differently: Take 40 mg by mouth 3 (three) times a week. Take 40 mg Monday, Wednesday, and Friday each week.), Disp: 90 tablet, Rfl: 3 .  glimepiride (AMARYL) 2 MG tablet, Take 4 mg by mouth daily with breakfast. , Disp: , Rfl:  .  insulin NPH-regular Human (NOVOLIN 70/30) (70-30) 100 UNIT/ML injection, Inject 30-34 Units into the skin 2 (two) times daily with a meal. Prior to surgery, she took 42 units in the morning 20 units in the evening. As of 08/23/2017, she is on 20 units in am and 20 units in the pm. Her Insulin gradually needs to be increased to pre surgery doses as glucose levels allow. (Patient taking differently: Inject 20 Units into the skin 2 (two) times daily before a meal. 50 qam, 22 qpm), Disp: 10 mL, Rfl: 11 .  lisinopril-hydrochlorothiazide (PRINZIDE,ZESTORETIC) 20-25 MG per tablet, Take 1 tablet by mouth daily., Disp: , Rfl:  .  metoprolol tartrate (LOPRESSOR) 25 MG tablet, TAKE 1 TABLET BY MOUTH TWICE A DAY, Disp: 180 tablet, Rfl: 3 .  pantoprazole (PROTONIX) 40 MG tablet, Take 1 tablet (40 mg total) by mouth daily. Take 40 mg by mouth once a day, Disp: 90 tablet, Rfl: 1 .  potassium chloride (MICRO-K) 10 MEQ CR capsule, Take 1 capsule (10 mEq total) by mouth daily., Disp: 30 capsule, Rfl: 6 .  Vitamin D, Cholecalciferol, 1000 units TABS, Take 1,000 Units by mouth every morning. , Disp: ,  Rfl:  .  warfarin (COUMADIN) 7.5 MG tablet, TAKE 1/2 TO 1 TABLET DAILY AS DIRECTED BY COUMADIN CLINIC (Patient taking differently: Take 1/2 on M/W/F, 1 tablet T/T/S/S), Disp: 90 tablet, Rfl: 0 .  simvastatin (ZOCOR) 40 MG tablet, TAKE 1 TABLET BY MOUTH EVERYDAY AT BEDTIME, Disp: 90 tablet, Rfl: 2   Allergies  Allergen Reactions  . Levofloxacin Itching  . Morphine And Related Itching    Pt reports oxycodone history with no allergy.     Social History   Occupational History  . Occupation: Retired  Tobacco Use  . Smoking status: Former Smoker    Packs/day: 1.00    Years: 20.00    Pack years: 20.00    Types: Cigarettes    Last attempt to  quit: 09/27/1984    Years since quitting: 34.0  . Smokeless tobacco: Never Used  Substance and Sexual Activity  . Alcohol use: No  . Drug use: No  . Sexual activity: Not on file     Family History  Problem Relation Age of Onset  . Hypertension Mother   . Coronary artery disease Brother   . Hypertension Sister      Immunization History  Administered Date(s) Administered  . Influenza Split 07/08/2012  . Influenza, High Dose Seasonal PF 07/28/2018  . Influenza,inj,Quad PF,6+ Mos 05/20/2014  . Influenza-Unspecified 06/27/2013  . Pneumococcal-Unspecified 11/26/2015  . Tdap 01/01/2013     Review of systems: Positive Findings in bold print.  Constitutional:  chills, fatigue, fever, sweats, weight change Communication: Optometrist, sign Ecologist, hand writing, iPad/Android device Head: headaches, head injury Eyes: changes in vision, eye pain, glaucoma, cataracts, macular degeneration, diplopia, glare,  light sensitivity, eyeglasses or contacts, blindness Ears nose mouth throat: Hard of hearing, ringing in ears, deaf, sign language,  vertigo,   nosebleeds,  rhinitis,  cold sores, snoring, swollen glands Cardiovascular: HTN, edema, arrhythmia, pacemaker in place, defibrillator in place,  chest pain/tightness, chronic anticoagulation,  blood clot, heart failure Peripheral Vascular: leg cramps, varicose veins, blood clots, lymphedema Respiratory:  difficulty breathing, denies congestion, SOB, wheezing, cough, emphysema Gastrointestinal: change in appetite or weight, abdominal pain, constipation, diarrhea, nausea, vomiting, vomiting blood, change in bowel habits, abdominal pain, jaundice, rectal bleeding, hemorrhoids, Genitourinary:  nocturia,  pain on urination,  blood in urine, Foley catheter, urinary urgency Musculoskeletal: uses mobility aid,  cramping, stiff joints, painful joints, decreased joint motion, fractures, OA, gout Skin: +changes in toenails, color change, dryness, itching, mole changes,  rash  Neurological: headaches, numbness in feet, paresthesias in feet, burning in feet, fainting,  seizures, change in speech. denies headaches, memory problems/poor historian, cerebral palsy, weakness, paralysis Endocrine: diabetes, hypothyroidism, hyperthyroidism,  goiter, dry mouth, flushing, heat intolerance,  cold intolerance,  excessive thirst, denies polyuria,  nocturia Hematological:  easy bleeding, excessive bleeding, easy bruising, enlarged lymph nodes, on long term blood thinner, history of past transusions Allergy/immunological:  hives, eczema, frequent infections, multiple drug allergies, seasonal allergies, transplant recipient Psychiatric:  anxiety, depression, mood disorder, suicidal ideations, hallucinations   Objective: Vascular Examination: Capillary refill time immediate x 10 digits Dorsalis pedis faintly palpable b/l Posterior tibial pulses present nonpalpable b/l No digital hair x 10 digits Skin temperature gradient WNL b/l  Dermatological Examination: Skin with normal turgor, texture and tone b/l  Toenails 1-5 b/l discolored, thick, dystrophic with subungual debris and pain with palpation to nailbeds due to thickness of nails.  Pincer nail deformity noted left great toe with no erythema, no edema, no  drainage.  Hyperkeratotic lesion noted submetatarsal head 5 b/l with no erythema, no flocculence, no drainage  Well healed surgical scar medial and lateral aspect of the left ankle.   Musculoskeletal: Muscle strength 5/5 to all LE muscle groups  Neurological: Sensation intact with 10 gram monofilament Vibratory sensation intact.  Assessment: 1. Painful onychomycosis toenails 1-5 b/l  2. NIDDM   Plan: 1. Discussed onychomycosis and treatment options.  Literature dispensed on today. 2. Toenails 1-5 b/l were debrided in length and girth without iatrogenic bleeding. 3. Hyperkeratotic lesions debrided submet head 5 b/l 4. Patient to continue soft, supportive shoe gear 5. Patient to report any pedal injuries to medical professional immediately. 6. Follow up 3 months. Patient/POA to call should there be a concern in the interim.

## 2018-10-23 ENCOUNTER — Other Ambulatory Visit: Payer: Self-pay | Admitting: Physician Assistant

## 2018-10-31 DIAGNOSIS — Z961 Presence of intraocular lens: Secondary | ICD-10-CM | POA: Diagnosis not present

## 2018-10-31 DIAGNOSIS — E113411 Type 2 diabetes mellitus with severe nonproliferative diabetic retinopathy with macular edema, right eye: Secondary | ICD-10-CM | POA: Diagnosis not present

## 2018-10-31 DIAGNOSIS — E113492 Type 2 diabetes mellitus with severe nonproliferative diabetic retinopathy without macular edema, left eye: Secondary | ICD-10-CM | POA: Diagnosis not present

## 2018-10-31 DIAGNOSIS — H26493 Other secondary cataract, bilateral: Secondary | ICD-10-CM | POA: Diagnosis not present

## 2018-11-03 ENCOUNTER — Ambulatory Visit (INDEPENDENT_AMBULATORY_CARE_PROVIDER_SITE_OTHER): Payer: Medicare HMO | Admitting: Pharmacist Clinician (PhC)/ Clinical Pharmacy Specialist

## 2018-11-03 DIAGNOSIS — I480 Paroxysmal atrial fibrillation: Secondary | ICD-10-CM | POA: Diagnosis not present

## 2018-11-03 DIAGNOSIS — Z7901 Long term (current) use of anticoagulants: Secondary | ICD-10-CM

## 2018-11-03 LAB — POCT INR: INR: 2.4 (ref 2.0–3.0)

## 2018-11-27 ENCOUNTER — Ambulatory Visit: Payer: Medicare HMO | Admitting: Podiatry

## 2018-11-27 ENCOUNTER — Ambulatory Visit (INDEPENDENT_AMBULATORY_CARE_PROVIDER_SITE_OTHER): Payer: Medicare HMO

## 2018-11-27 DIAGNOSIS — B351 Tinea unguium: Secondary | ICD-10-CM

## 2018-11-27 DIAGNOSIS — M7662 Achilles tendinitis, left leg: Secondary | ICD-10-CM

## 2018-11-27 DIAGNOSIS — M79674 Pain in right toe(s): Secondary | ICD-10-CM | POA: Diagnosis not present

## 2018-11-27 DIAGNOSIS — M25472 Effusion, left ankle: Secondary | ICD-10-CM

## 2018-11-27 DIAGNOSIS — M216X2 Other acquired deformities of left foot: Secondary | ICD-10-CM

## 2018-11-27 DIAGNOSIS — M79675 Pain in left toe(s): Secondary | ICD-10-CM | POA: Diagnosis not present

## 2018-11-27 IMAGING — RF DG ESOPHAGUS
5 series · 14 of 19 positions shown · non-contrast
Comparison: None.

CLINICAL DATA: Dysphagia.  History of esophageal dilatation

EXAM:
ESOPHOGRAM/BARIUM SWALLOW
TECHNIQUE: Single contrast examination was performed using  thin barium.
FLUOROSCOPY TIME:  Fluoroscopy Time:  1 minutes 36 second
Radiation Exposure Index (if provided by the fluoroscopic device):
Number of Acquired Spot Images: 0

[Series 1: sequence · 0.28mm/px · 3 of 34 frames shown (1 of 4)]
[frame 6/34]
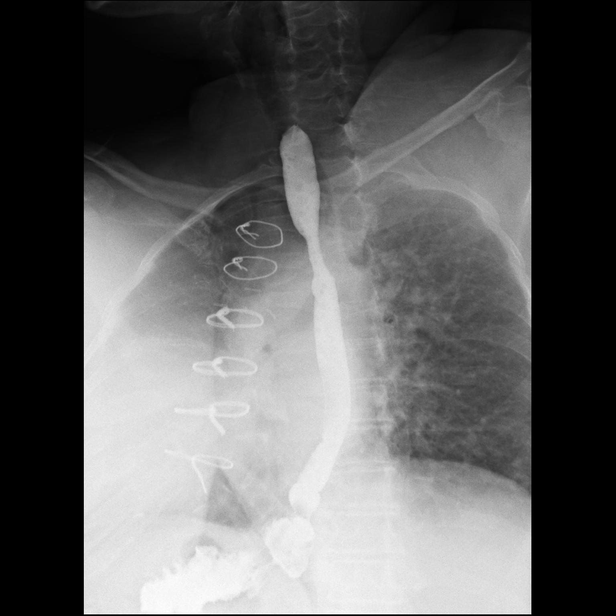
[frame 18/34]
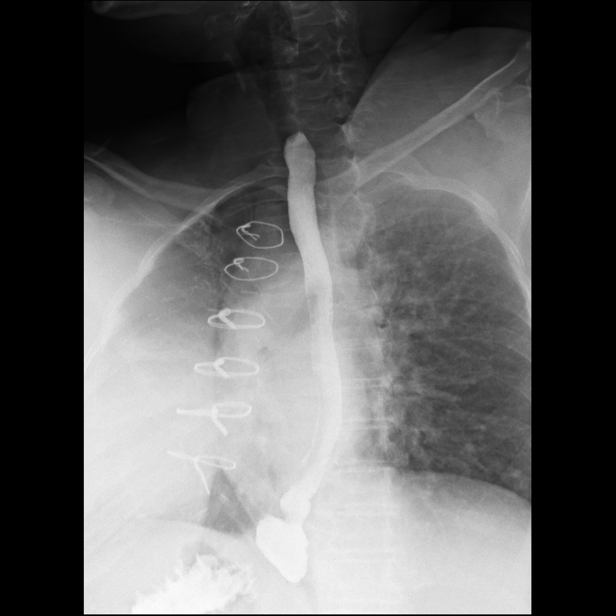
[frame 29/34]
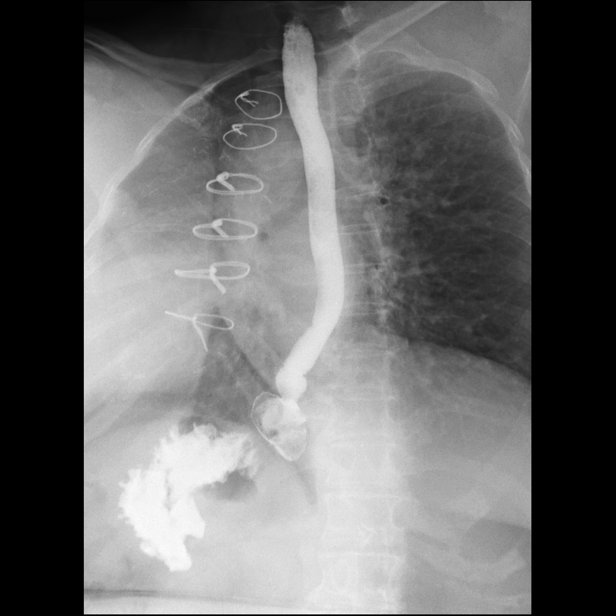

[Series 2: sequence · 0.31mm/px · 3 of 26 frames shown (2 of 4)]
[frame 4/26]
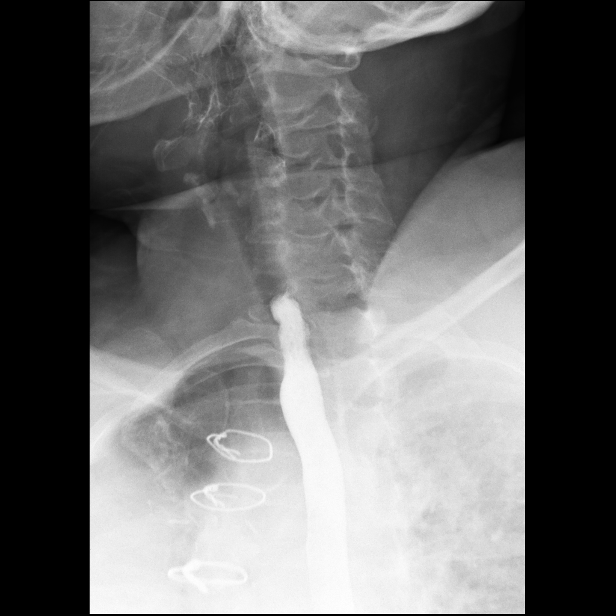
[frame 14/26]
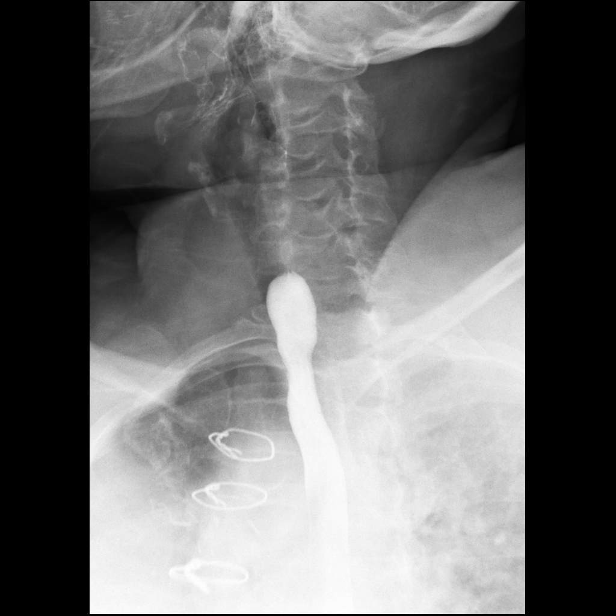
[frame 23/26]
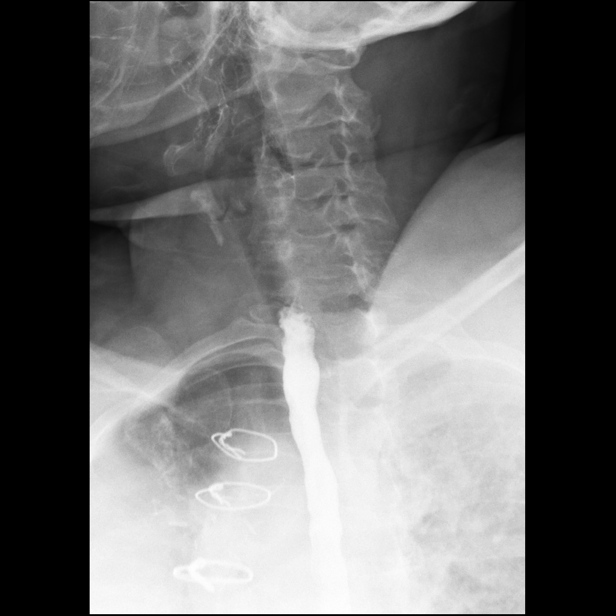

[Series 3: sequence · 0.31mm/px · 3 of 29 frames shown (3 of 4)]
[frame 5/29]
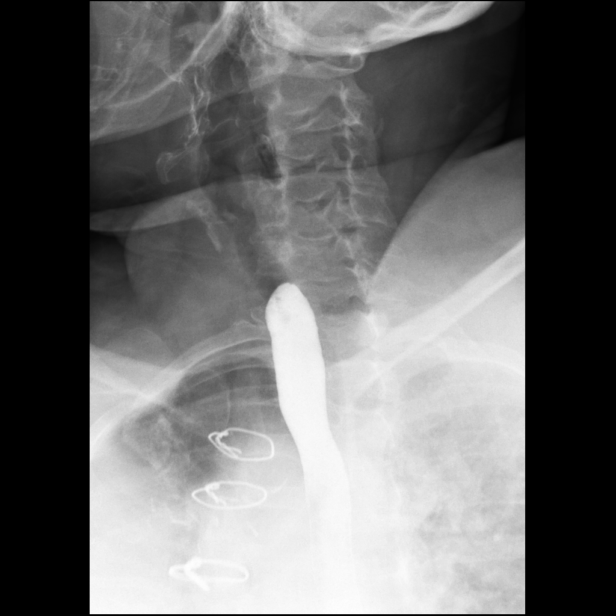
[frame 15/29]
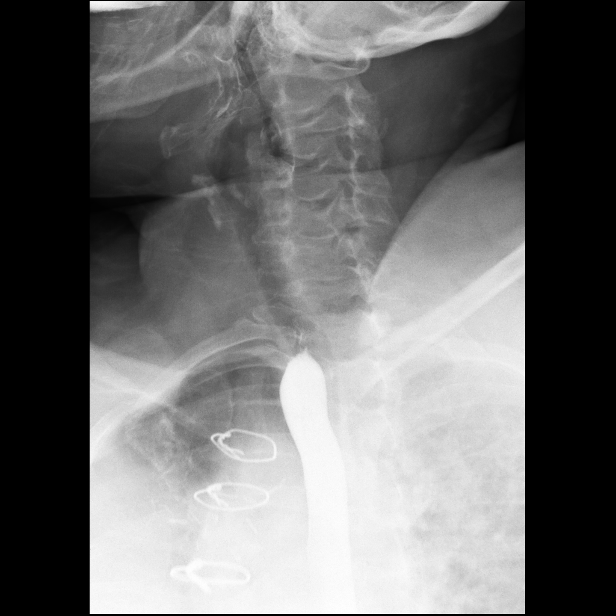
[frame 25/29]
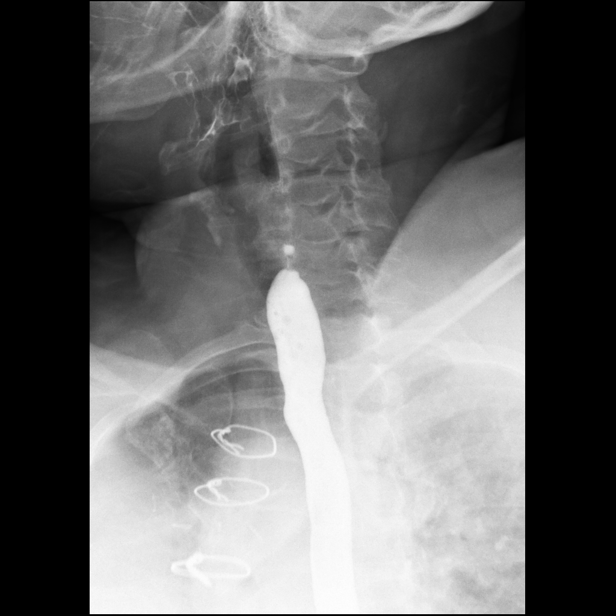

[Series 4: sequence · 0.31mm/px · 3 of 14 frames shown (4 of 4)]
[frame 3/14]
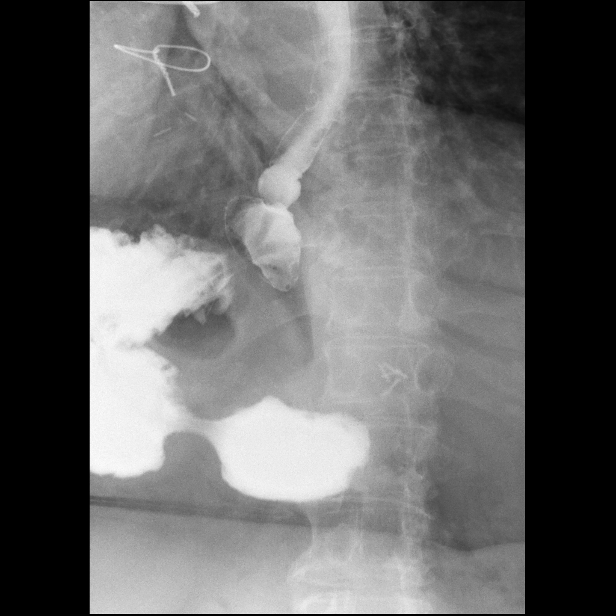
[frame 12/14]
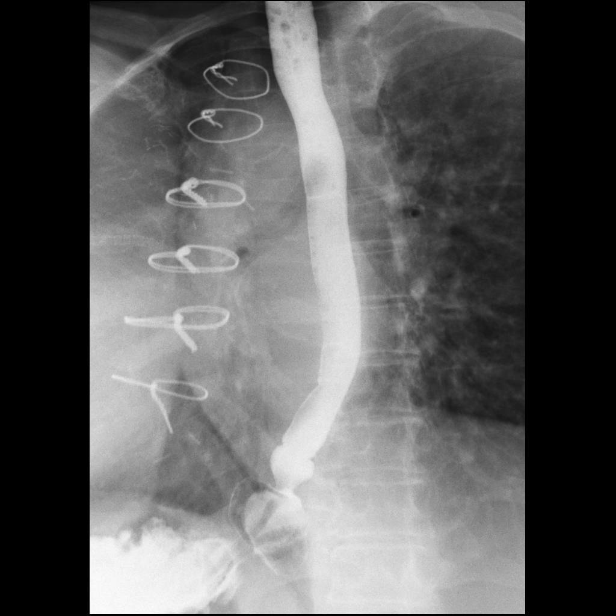
[frame 13/14]
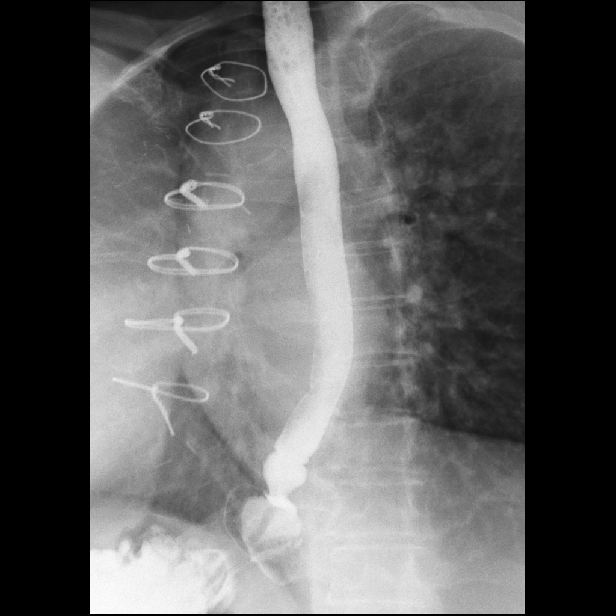

[Series 5: one shot · 2 of 3 slices shown]
[im 1/3]
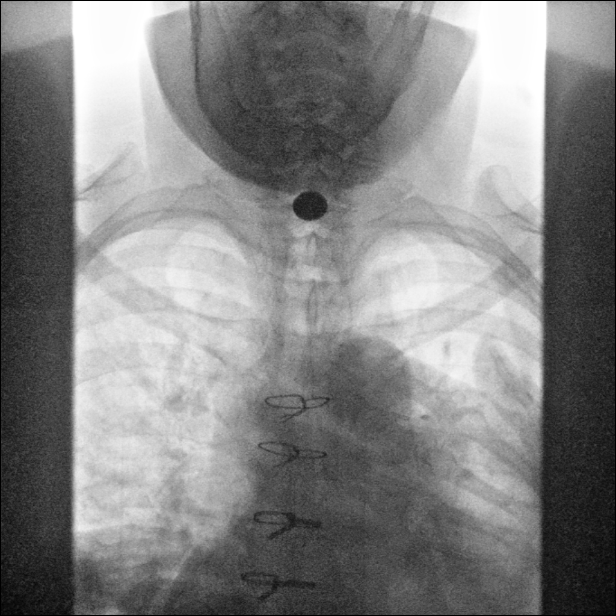
[im 3/3]
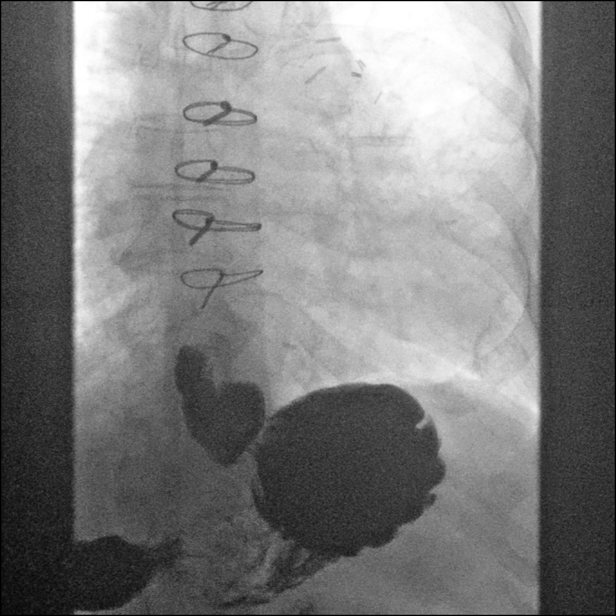

[14 of 19 positions shown; findings below may reference images not displayed]

FINDINGS: Single contrast study was performed given the patient's limited
mobility.

Severe focal stricture proximal esophagus at the C5-6 level. This
appears benign. Barium tablet did not pass through this area

Mild decrease in esophageal motility. Small hiatal hernia. Mild
stricture at the GE junction. Mild to moderate gastroesophageal
reflux with the patient supine
IMPRESSION: Severe focal stricture in the proximal esophagus at the C5-6 level
appears benign. Barium tablet did not pass through the stricture.

Small hiatal hernia with mild stricture at the GE junction.
Mild-to-moderate gastroesophageal reflux.

## 2018-11-27 NOTE — Patient Instructions (Addendum)
Achilles Tendinitis  Achilles tendinitis is inflammation of the tough, cord-like band that attaches the lower leg muscles to the heel bone (Achilles tendon). This is usually caused by overusing the tendon and the ankle joint. Achilles tendinitis usually gets better over time with treatment and caring for yourself at home. It can take weeks or months to heal completely. What are the causes? This condition may be caused by:  A sudden increase in exercise or activity, such as running.  Doing the same exercises or activities (such as jumping) over and over.  Not warming up calf muscles before exercising.  Exercising in shoes that are worn out or not made for exercise.  Having arthritis or a bone growth (spur) on the back of the heel bone. This can rub against the tendon and hurt it.  Age-related wear and tear. Tendons become less flexible with age and more likely to be injured. What are the signs or symptoms? Common symptoms of this condition include:  Pain in the Achilles tendon or in the back of the leg, just above the heel. The pain usually gets worse with exercise.  Stiffness or soreness in the back of the leg, especially in the morning.  Swelling of the skin over the Achilles tendon.  Thickening of the tendon.  Bone spurs at the bottom of the Achilles tendon, near the heel.  Trouble standing on tiptoe. How is this diagnosed? This condition is diagnosed based on your symptoms and a physical exam. You may have tests, including:  X-rays.  MRI. How is this treated? The goal of treatment is to relieve symptoms and help your injury heal. Treatment may include:  Decreasing or stopping activities that caused the tendinitis. This may mean switching to low-impact exercises like biking or swimming.  Icing the injured area.  Doing physical therapy, including strengthening and stretching exercises.  NSAIDs to help relieve pain and swelling.  Using supportive shoes, wraps, heel  lifts, or a walking boot (air cast).  Surgery. This may be done if your symptoms do not improve after 6 months.  Using high-energy shock wave impulses to stimulate the healing process (extracorporeal shock wave therapy). This is rare.  Injection of medicines to help relieve inflammation (corticosteroids). This is rare. Follow these instructions at home: If you have an air cast:  Wear the cast as told by your health care provider. Remove it only as told by your health care provider.  Loosen the cast if your toes tingle, become numb, or turn cold and blue. Activity  Gradually return to your normal activities once your health care provider approves. Do not do activities that cause pain. ? Consider doing low-impact exercises, like cycling or swimming.  If you have an air cast, ask your health care provider when it is safe for you to drive.  If physical therapy was prescribed, do exercises as told by your health care provider or physical therapist. Managing pain, stiffness, and swelling   Raise (elevate) your foot above the level of your heart while you are sitting or lying down.  Move your toes often to avoid stiffness and to lessen swelling.  If directed, put ice on the injured area: ? Put ice in a plastic bag. ? Place a towel between your skin and the bag. ? Leave the ice on for 20 minutes, 2-3 times a day General instructions  If directed, wrap your foot with an elastic bandage or other wrap. This can help keep your tendon from moving too much while it  heals. Your health care provider will show you how to wrap your foot correctly.  Wear supportive shoes or heel lifts only as told by your health care provider.  Take over-the-counter and prescription medicines only as told by your health care provider.  Keep all follow-up visits as told by your health care provider. This is important. Contact a health care provider if:  You have symptoms that gets worse.  You have pain that  does not get better with medicine.  You develop new, unexplained symptoms.  You develop warmth and swelling in your foot.  You have a fever. Get help right away if:  You have a sudden popping sound or sensation in your Achilles tendon followed by severe pain.  You cannot move your toes or foot.  You cannot put any weight on your foot. Summary  Achilles tendinitis is inflammation of the tough, cord-like band that attaches the lower leg muscles to the heel bone (Achilles tendon).  This condition is usually caused by overusing the tendon and the ankle joint. It can also be caused by arthritis or normal aging.  The most common symptoms of this condition include pain, swelling, or stiffness in the Achilles tendon or in the back of the leg.  This condition is usually treated with rest, NSAIDs, and physical therapy. This information is not intended to replace advice given to you by your health care provider. Make sure you discuss any questions you have with your health care provider. Document Released: 06/23/2005 Document Revised: 08/02/2016 Document Reviewed: 08/02/2016 Elsevier Interactive Patient Education  2019 [employer].

## 2018-11-28 ENCOUNTER — Encounter: Payer: Self-pay | Admitting: Podiatry

## 2018-11-28 NOTE — Progress Notes (Signed)
Subjective: [name] presents today for preventative diabetic foot care. She is seen for  painful, elongated toenails which interfere with daily activities. When elongated, pain is aggravated when wearing enclosed shoe gear and is relieved with periodic professional debridement.   Today, she relates new concern of painful left ankle. She has h/o ankle fx left LE with ORIF. She denies any h/o falls/trauma to the extremity.  Past Medical History:  Diagnosis Date  . Arthritis    "knees" (02/05/2016)  . Cancer ([place]) 5/10   L[facility] Vats   . Coronary artery disease   . GERD (gastroesophageal reflux disease)    tx meds  . H/O hiatal hernia   . History of blood transfusion "several"   last april 2015 s/p knee surgery (02/05/2016)  . Hypercholesteremia   . Hypertension   . Lung cancer ([place])    2010, surgery 18% left lung no radiation or chemo  . Non-STEMI (non-ST elevated myocardial infarction) ([place]) 04/07/2012   See cath results below  . Paroxysmal atrial fibrillation ([place]) 04/07/2012   On Warfarin  . Peripheral vascular disease ([place]) 8/12   Lt SFA PTA  . Presence of stent in LAD coronary artery 04/07/2012   Xience Expedition DES 2.75 mm x 18 mm (dilated to 3.0 mm)  . Sleep apnea    does not wear CPAP  . Type II diabetes mellitus (HCC)    insulin dependent     Patient Active Problem List   Diagnosis Date Noted  . Hypercholesterolemia 01/11/2018  . Diabetes mellitus type 2 in obese ([place]) 01/11/2018  . Pulmonary nodule/lesion, solitary 11/23/2017  . RBBB 10/31/2017  . S/P CABG x 1 08/17/2017  . Abnormal nuclear stress test 02/05/2016  . Chest pain with high risk for cardiac etiology 02/05/2016  . CAD S/P percutaneous coronary angioplasty 02/05/2016  . GERD (gastroesophageal reflux disease) 01/06/2016  . CKD (chronic kidney disease) stage 3, GFR 30-59 ml/min (HCC) 01/06/2016  . Chronic diastolic heart failure ([place]) 06/28/2015  . Esophageal reflux 02/15/2014  . Anemia 01/17/2014   . S/P left TK revision 12/31/2013  . Knee osteomyelitis, left ([place]) 11/21/2013  . Foreign body of knee with infection 11/18/2013  . Unspecified constipation 11/07/2013  . Expected blood loss anemia 11/06/2013  . Left knee spacer, following removal of joint prosthesis 11/05/2013  . DM (diabetes mellitus), type 2 with peripheral vascular complications ([place]) 17/61/6073  . Onychomycosis 10/23/2013  . Pain, foot 10/23/2013  . DJD (degenerative joint disease) of knee 04/10/2013  . Vaginal atrophy 01/01/2013  . OAB (overactive bladder) 01/01/2013  . Osteopenia 01/01/2013  . Chest pain 11/28/2012  . Palpitations 11/22/2012  . Dyslipidemia 11/22/2012  . Chronic anticoagulation 11/22/2012  . Super obese 07/11/2012  . Trimalleolar fracture 07/07/2012  . Coronary artery disease involving native coronary artery of native heart without angina pectoris 04/08/2012  . ICM, EF 40-45% cath 04/07/12- improved to 50-60% by echo Oct 2013 04/08/2012  . Paroxysmal atrial fibrillation ([place]) 04/07/2012  . NSTEMI (non-ST elevated myocardial infarction)- 7/13 04/07/2012  . Unstable angina ([place]) 04/07/2012  . PVD (peripheral vascular disease), Lt SFA PTA Aug 2012 04/07/2012  . Cancer of left lung parenchyma ([place]) 01/13/2009  . Insulin dependent diabetes mellitus ([place]) 11/27/2008  . Essential hypertension 11/27/2008     Past Surgical History:  Procedure Laterality Date  . ABDOMINAL HYSTERECTOMY  1990  . BREAST EXCISIONAL BIOPSY Right   . CARDIAC CATHETERIZATION  11/04/2008   patent RCA, LM, and Circ, nl EF  . CARDIAC  CATHETERIZATION  02/20/2002   patent coronaries with the only abnormality being a smooth luminla irregularity in the mid intermediate ramus branch no felt to be hemodynamically significant, nl LV  . CARDIAC CATHETERIZATION N/A 02/05/2016   Procedure: Left Heart Cath and Coronary Angiography;  Surgeon: Leonie Man, MD;  Location: [facility];  Service: Cardiovascular;  Laterality: N/A;   . CARDIAC CATHETERIZATION N/A 02/05/2016   Procedure: Coronary Stent Intervention;  Surgeon: Leonie Man, MD;  Location: [facility];  Service: Cardiovascular;  Laterality: N/A;  . CATARACT EXTRACTION W/ INTRAOCULAR LENS  IMPLANT, BILATERAL  2013  . CORONARY ANGIOPLASTY WITH STENT PLACEMENT  04/07/2012   Xience Expedition DES 2.9mm x 18 mm (dilated to 3.0 mm) to the prox LAD   . CORONARY ANGIOPLASTY WITH STENT PLACEMENT  2013; 2015  . CORONARY ARTERY BYPASS GRAFT N/A 08/17/2017   Procedure: CORONARY ARTERY BYPASS GRAFTING (CABG) TIMES 1 USING LEFT INTERNAL MAMMARY ARTERY;  Surgeon: Melrose Nakayama, MD;  Location: [facility];  Service: Open Heart Surgery;  Laterality: N/A;  . DILATION AND CURETTAGE OF UTERUS  <1990  . ESOPHAGOGASTRODUODENOSCOPY N/A 05/23/2015   Procedure: ESOPHAGOGASTRODUODENOSCOPY (EGD);  Surgeon: Carol Ada, MD;  Location: [facility];  Service: Endoscopy;  Laterality: N/A;  . ESOPHAGOGASTRODUODENOSCOPY (EGD) WITH PROPOFOL N/A 06/13/2015   Procedure: ESOPHAGOGASTRODUODENOSCOPY (EGD) WITH PROPOFOL;  Surgeon: Carol Ada, MD;  Location: [facility];  Service: Endoscopy;  Laterality: N/A;  . ESOPHAGOGASTRODUODENOSCOPY (EGD) WITH PROPOFOL N/A 04/07/2018   Procedure: ESOPHAGOGASTRODUODENOSCOPY (EGD) WITH PROPOFOL;  Surgeon: Carol Ada, MD;  Location: [facility];  Service: Endoscopy;  Laterality: N/A;  fluoroscopy is needed  . FRACTURE SURGERY    . HAMMER TOE SURGERY Bilateral 1993   with screws, on screw removed  . JOINT REPLACEMENT    . KNEE ARTHROSCOPY Bilateral    left x2, right x1  . LAPAROSCOPIC CHOLECYSTECTOMY    . LEFT HEART CATH AND CORONARY ANGIOGRAPHY N/A 08/11/2017   Procedure: LEFT HEART CATH AND CORONARY ANGIOGRAPHY;  Surgeon: Martinique, Peter M, MD;  Location: [facility];  Service: Cardiovascular;  Laterality: N/A;  . LEFT HEART CATHETERIZATION WITH CORONARY ANGIOGRAM N/A 04/07/2012   Procedure: LEFT HEART CATHETERIZATION WITH CORONARY  ANGIOGRAM;  Surgeon: [name], MD;  Location: [facility];  Service: Cardiovascular;  Laterality: N/A;  . LEFT HEART CATHETERIZATION WITH CORONARY ANGIOGRAM N/A 05/27/2014   Procedure: LEFT HEART CATHETERIZATION WITH CORONARY ANGIOGRAM;  Surgeon: [name], MD; LAD 99% ISR, CFX 50-60%, RCA (dominant) no sig dz, EF 60%  . LOWER EXTREMITY ANGIOGRAM  05/17/11   directional atherectomy to the prox L SFA using a LX Man TurboHawk, ballooned with a [employer] balloon   . ORIF ANKLE FRACTURE  07/09/2012   Procedure: OPEN REDUCTION INTERNAL FIXATION (ORIF) ANKLE FRACTURE;  Surgeon: Meredith Pel, MD;  Location: [facility];  Service: Orthopedics;  Laterality: Left;  open reduction internal fixation trimalleolar ankle fracture medial malleolous fixation  . PERCUTANEOUS CORONARY STENT INTERVENTION (PCI-S) N/A 04/07/2012   Procedure: PERCUTANEOUS CORONARY STENT INTERVENTION (PCI-S);  Surgeon: [name], MD;  Location: [facility];  Service: Cardiovascular;  Laterality: N/A;  . PERCUTANEOUS CORONARY STENT INTERVENTION (PCI-S)  05/27/2014   Procedure: PERCUTANEOUS CORONARY STENT INTERVENTION (PCI-S);  Surgeon: [name], MD; 3 mm x 12 mm long Xience Xpedition DES to the proximal LAD  . SAVORY DILATION N/A 06/13/2015   Procedure: SAVORY DILATION;  Surgeon: Carol Ada, MD;  Location: [facility];  Service: Endoscopy;  Laterality: N/A;  . SAVORY DILATION N/A 04/07/2018   Procedure: SAVORY DILATION;  Surgeon: Carol Ada, MD;  Location: [facility];  Service: Endoscopy;  Laterality: N/A;  . TEE WITHOUT CARDIOVERSION N/A 08/17/2017   Procedure: TRANSESOPHAGEAL ECHOCARDIOGRAM (TEE);  Surgeon: Melrose Nakayama, MD;  Location: [facility];  Service: Open Heart Surgery;  Laterality: N/A;  . TONSILLECTOMY    . TOTAL KNEE ARTHROPLASTY Left 2003  . TOTAL KNEE REVISION Left 11/05/2013   Procedure: LEFT TOTAL KNEE RESECTION;  Surgeon: Mauri Pole, MD;  Location: [facility];  Service: Orthopedics;   Laterality: Left;  . TOTAL KNEE REVISION Left 2007   opened in 2006 and cleaned out, 2007 revision  . TOTAL KNEE REVISION Left 12/31/2013   Procedure: RE-INPLANTATION LEFT TOTAL KNEE ;  Surgeon: Mauri Pole, MD;  Location: [facility];  Service: Orthopedics;  Laterality: Left;  Marland Kitchen VIDEO ASSISTED THORACOSCOPY (VATS)/ LOBECTOMY  01/2009      Current Outpatient Medications:  .  aspirin EC 81 MG tablet, Take 81 mg by mouth daily., Disp: , Rfl:  .  Fe Fum-FePoly-Vit C-Vit B3 (INTEGRA) 62.5-62.5-40-3 MG CAPS, Take 1 capsule by mouth daily., Disp: , Rfl:  .  furosemide (LASIX) 40 MG tablet, Take 1 tablet (40 mg total) by mouth daily. (Patient taking differently: Take 40 mg by mouth 3 (three) times a week. Take 40 mg Monday, Wednesday, and Friday each week.), Disp: 90 tablet, Rfl: 3 .  glimepiride (AMARYL) 2 MG tablet, Take 4 mg by mouth daily with breakfast. , Disp: , Rfl:  .  insulin NPH-regular Human (NOVOLIN 70/30) (70-30) 100 UNIT/ML injection, Inject 30-34 Units into the skin 2 (two) times daily with a meal. Prior to surgery, she took 42 units in the morning 20 units in the evening. As of 08/23/2017, she is on 20 units in am and 20 units in the pm. Her Insulin gradually needs to be increased to pre surgery doses as glucose levels allow. (Patient taking differently: Inject 20 Units into the skin 2 (two) times daily before a meal. 50 qam, 22 qpm), Disp: 10 mL, Rfl: 11 .  lisinopril-hydrochlorothiazide (PRINZIDE,ZESTORETIC) 20-25 MG per tablet, Take 1 tablet by mouth daily., Disp: , Rfl:  .  metoprolol tartrate (LOPRESSOR) 25 MG tablet, TAKE 1 TABLET BY MOUTH TWICE A DAY, Disp: 180 tablet, Rfl: 3 .  pantoprazole (PROTONIX) 40 MG tablet, Take 1 tablet (40 mg total) by mouth daily. Take 40 mg by mouth once a day, Disp: 90 tablet, Rfl: 1 .  potassium chloride (MICRO-K) 10 MEQ CR capsule, TAKE 1 CAPSULE BY MOUTH EVERY DAY, Disp: 90 capsule, Rfl: 1 .  simvastatin (ZOCOR) 40 MG tablet, TAKE 1 TABLET BY MOUTH  EVERYDAY AT BEDTIME, Disp: 90 tablet, Rfl: 2 .  Vitamin D, Cholecalciferol, 1000 units TABS, Take 1,000 Units by mouth every morning. , Disp: , Rfl:  .  warfarin (COUMADIN) 7.5 MG tablet, TAKE 1/2 TO 1 TABLET DAILY AS DIRECTED BY COUMADIN CLINIC (Patient taking differently: Take 1/2 on M/W/F, 1 tablet T/T/S/S), Disp: 90 tablet, Rfl: 0   Allergies  Allergen Reactions  . Levofloxacin Itching  . Morphine And Related Itching    Pt reports oxycodone history with no allergy.     Social History   Occupational History  . Occupation: Retired  Tobacco Use  . Smoking status: Former Smoker    Packs/day: 1.00    Years: 20.00    Pack years: 20.00    Types: Cigarettes  Last attempt to quit: 09/27/1984    Years since quitting: 34.1  . Smokeless tobacco: Never Used  Substance and Sexual Activity  . Alcohol use: No  . Drug use: No  . Sexual activity: Not on file     Family History  Problem Relation Age of Onset  . Hypertension Mother   . Coronary artery disease Brother   . Hypertension Sister      Immunization History  Administered Date(s) Administered  . Influenza Split 07/08/2012  . Influenza, High Dose Seasonal PF 07/28/2018  . Influenza,inj,Quad PF,6+ Mos 05/20/2014  . Influenza-Unspecified 06/27/2013  . Pneumococcal-Unspecified 11/26/2015  . Tdap 01/01/2013     Review of systems: Positive Findings in bold print.  Constitutional:  chills, fatigue, fever, sweats, weight change Communication: Optometrist, sign Ecologist, hand writing, iPad/Android device Head: headaches, head injury Eyes: changes in vision, eye pain, glaucoma, cataracts, macular degeneration, diplopia, glare,  light sensitivity, eyeglasses or contacts, blindness Ears nose mouth throat: Hard of hearing, ringing in ears, deaf, sign language,  vertigo,   nosebleeds,  rhinitis,  cold sores, snoring, swollen glands Cardiovascular: HTN, edema, arrhythmia, pacemaker in place, defibrillator in place,  chest  pain/tightness, chronic anticoagulation, blood clot, heart failure Peripheral Vascular: leg cramps, varicose veins, blood clots, lymphedema Respiratory:  difficulty breathing, denies congestion, SOB, wheezing, cough, emphysema Gastrointestinal: change in appetite or weight, abdominal pain, constipation, diarrhea, nausea, vomiting, vomiting blood, change in bowel habits, abdominal pain, jaundice, rectal bleeding, hemorrhoids, Genitourinary:  nocturia,  pain on urination,  blood in urine, Foley catheter, urinary urgency Musculoskeletal: uses mobility aid,  cramping, stiff joints, painful joints, decreased joint motion, fracture(s), OA, gout Skin: +changes in toenails, color change, dryness, itching, mole changes,  rash  Neurological: headaches, numbness in feet, paresthesias in feet, burning in feet, fainting,  seizures, change in speech. denies headaches, memory problems/poor historian, cerebral palsy, weakness, paralysis Endocrine: diabetes, hypothyroidism, hyperthyroidism,  goiter, dry mouth, flushing, heat intolerance,  cold intolerance,  excessive thirst, denies polyuria,  nocturia Hematological:  easy bleeding, excessive bleeding, easy bruising, enlarged lymph nodes, on long term blood thinner, history of past transusions Allergy/immunological:  hives, eczema, frequent infections, multiple drug allergies, seasonal allergies, transplant recipient Psychiatric:  anxiety, depression, mood disorder, suicidal ideations, hallucinations   Objective: Vascular Examination: Capillary refill time immediate x 10 digits Dorsalis pedis faintly palpable b/l Posterior tibial pulses present nonpalpable b/l No digital hair x 10 digits Skin temperature gradient WNL b/l Edema noted left ankle, nonpitting in nature  Dermatological Examination: Skin with normal turgor, texture and tone b/l  Toenails 1-5 b/l discolored, thick, dystrophic with subungual debris and pain with palpation to nailbeds due to thickness  of nails.  Well-healed surgical scars noted on the medial and lateral aspect of the left ankle from prior ORIF.  Pincer nail deformity noted left great toe with no erythema, no edema, no drainage.  Hyperkeratotic lesion noted submetatarsal head 5 b/l with no erythema, no flocculence, no drainage  Well healed surgical scar medial and lateral aspect of the left ankle.   Musculoskeletal: Muscle strength 5/5 to all LE muscle groups  Moderate equinus noted left lower extremity with tenderness with medial to lateral palpation of Achilles tendon.  Neurological: Sensation intact with 10 gram monofilament  Vibratory sensation intact.  X-ray left ankle reveals evidence of ORIF.  She has 2 screws transversing the tibial malleolus.  She has a plate noted on the fibular shaft of the malleolus.  There is one screw noted to possibly be out  of place.  Also noted plantar and posterior calcaneal spurs.  Assessment: 1. Painful onychomycosis toenails 1-5 b/l  2. Achilles tendinitis left lower extremity 3. Equinus deformity left lower extremity 4. NIDDM   Plan: 1. X-rays taken left lower extremity reviewed with patient.  Patient did want a copy of the x-rays to take to her former Psychologist, sport and exercise. 2. Toenails 1-5 b/l were debrided in length and girth without iatrogenic bleeding. 3. Dispensed night splint for wear when in bed. 4. Referred to physical therapy for evaluation and treatment equinus and Achilles tendinitis left lower extremity.  I will defer any anti-inflammatories due to her use of blood thinners. 5. Patient to continue soft, supportive shoe gear. 6. Patient to report any pedal injuries to medical professional immediately. 7. Follow up 3 months for routine diabetic foot care.  Follow-up 3 weeks for Achilles tendinitis to check progress night splint therapy and physical therapy. 8. Patient/POA to call should there be a concern in the interim.

## 2018-11-29 ENCOUNTER — Telehealth: Payer: Self-pay | Admitting: *Deleted

## 2018-11-29 DIAGNOSIS — M7662 Achilles tendinitis, left leg: Secondary | ICD-10-CM

## 2018-11-29 DIAGNOSIS — M25472 Effusion, left ankle: Secondary | ICD-10-CM

## 2018-11-29 NOTE — Telephone Encounter (Signed)
Referral to [facility] delivered.

## 2018-11-30 ENCOUNTER — Ambulatory Visit (INDEPENDENT_AMBULATORY_CARE_PROVIDER_SITE_OTHER): Payer: Medicare HMO | Admitting: Orthopedic Surgery

## 2018-11-30 ENCOUNTER — Encounter (INDEPENDENT_AMBULATORY_CARE_PROVIDER_SITE_OTHER): Payer: Self-pay | Admitting: Orthopedic Surgery

## 2018-11-30 DIAGNOSIS — M25572 Pain in left ankle and joints of left foot: Secondary | ICD-10-CM | POA: Diagnosis not present

## 2018-11-30 NOTE — Progress Notes (Signed)
Office Visit Note   Patient: [name]           Date of Birth: March 07, 1951           MRN: [id number] Visit Date: 11/30/2018 Requested by: [name], [address], [place] [id number] PCP: [name], MD  Subjective: Chief Complaint  Patient presents with  . Left Ankle - Pain    HPI: [name] is a patient with left ankle and foot pain.  She had previous bimalleolar ankle fracture treated in 2013.  She saw the podiatrist to was concerned about some of the hardware placement.  She is having lateral sided pain only but also has significant Achilles tendinitis.  She has been ambulating with a walker.  She has gone to the foot center and was advised that she has some spurring and tendinitis.  I reviewed her outside x-rays and the hardware is in perfect position.  The screw that the podiatrist is talking about is actually a traversing screw perpendicular to the plate which actually acts as a lag screw for the oblique fracture.  The plate is a stabilizing force.  There are 0 issues with hardware placement.  The fracture has healed there is no evidence of screw loosening or malposition.              ROS: All systems reviewed are negative as they relate to the chief complaint within the history of present illness.  Patient denies  fevers or chills.   Assessment & Plan: Visit Diagnoses:  1. Pain in left ankle and joints of left foot     Plan: Impression is left ankle pain with significant Achilles tendinitis.  I think that is the main issue here.  Her hemoglobin A1c is 8.4 and thus no real surgical interventions indicated at this time for that ankle.  I would not recommend hardware removal at this time based on examination.  Follow-up with me as needed.  Follow-Up Instructions: Return if symptoms worsen or fail to improve.   Orders:  No orders of the defined types were placed in this encounter.  No orders of the defined types were placed in this  encounter.     Procedures: No procedures performed   Clinical Data: No additional findings.  Objective: Vital Signs: There were no vitals taken for this visit.  Physical Exam:   Constitutional: Patient appears well-developed HEENT:  Head: Normocephalic Eyes:EOM are normal Neck: Normal range of motion Cardiovascular: Normal rate Pulmonary/chest: Effort normal Neurologic: Patient is alert Skin: Skin is warm Psychiatric: Patient has normal mood and affect    Ortho Exam: Ortho exam demonstrates tight Achilles tendon on the left.  She has palpable pedal pulses.  Well-healed surgical incisions without erythema warmth.  She does have some diminished ankle dorsiflexion and plantarflexion consistent with mild arthritis in the tibiotalar joint.  Palpable intact nontender anterior to posterior tib peroneal tendons with mild tenderness on the Achilles tendon.  Specialty Comments:  No specialty comments available.  Imaging: No results found.   PMFS History: Patient Active Problem List   Diagnosis Date Noted  . Hypercholesterolemia 01/11/2018  . Diabetes mellitus type 2 in obese ([place]) 01/11/2018  . Pulmonary nodule/lesion, solitary 11/23/2017  . RBBB 10/31/2017  . S/P CABG x 1 08/17/2017  . Abnormal nuclear stress test 02/05/2016  . Chest pain with high risk for cardiac etiology 02/05/2016  . CAD S/P percutaneous coronary angioplasty 02/05/2016  . GERD (gastroesophageal reflux disease) 01/06/2016  . CKD (  chronic kidney disease) stage 3, GFR 30-59 ml/min (HCC) 01/06/2016  . Chronic diastolic heart failure ([place]) 06/28/2015  . Esophageal reflux 02/15/2014  . Anemia 01/17/2014  . S/P left TK revision 12/31/2013  . Knee osteomyelitis, left ([place]) 11/21/2013  . Foreign body of knee with infection 11/18/2013  . Unspecified constipation 11/07/2013  . Expected blood loss anemia 11/06/2013  . Left knee spacer, following removal of joint prosthesis 11/05/2013  . DM (diabetes  mellitus), type 2 with peripheral vascular complications ([place]) 50/05/3817  . Onychomycosis 10/23/2013  . Pain, foot 10/23/2013  . DJD (degenerative joint disease) of knee 04/10/2013  . Vaginal atrophy 01/01/2013  . OAB (overactive bladder) 01/01/2013  . Osteopenia 01/01/2013  . Chest pain 11/28/2012  . Palpitations 11/22/2012  . Dyslipidemia 11/22/2012  . Chronic anticoagulation 11/22/2012  . Super obese 07/11/2012  . Trimalleolar fracture 07/07/2012  . Coronary artery disease involving native coronary artery of native heart without angina pectoris 04/08/2012  . ICM, EF 40-45% cath 04/07/12- improved to 50-60% by echo Oct 2013 04/08/2012  . Paroxysmal atrial fibrillation ([place]) 04/07/2012  . NSTEMI (non-ST elevated myocardial infarction)- 7/13 04/07/2012  . Unstable angina ([place]) 04/07/2012  . PVD (peripheral vascular disease), Lt SFA PTA Aug 2012 04/07/2012  . Cancer of left lung parenchyma ([place]) 01/13/2009  . Insulin dependent diabetes mellitus ([place]) 11/27/2008  . Essential hypertension 11/27/2008   Past Medical History:  Diagnosis Date  . Arthritis    "knees" (02/05/2016)  . Cancer ([place]) 5/10   LUL Vats   . Coronary artery disease   . GERD (gastroesophageal reflux disease)    tx meds  . H/O hiatal hernia   . History of blood transfusion "several"   last april 2015 s/p knee surgery (02/05/2016)  . Hypercholesteremia   . Hypertension   . Lung cancer ([place])    2010, surgery 18% left lung no radiation or chemo  . Non-STEMI (non-ST elevated myocardial infarction) ([place]) 04/07/2012   See cath results below  . Paroxysmal atrial fibrillation ([place]) 04/07/2012   On Warfarin  . Peripheral vascular disease ([place]) 8/12   Lt SFA PTA  . Presence of stent in LAD coronary artery 04/07/2012   Xience Expedition DES 2.75 mm x 18 mm (dilated to 3.0 mm)  . Sleep apnea    does not wear CPAP  . Type II diabetes mellitus (HCC)    insulin dependent    Family History  Problem Relation Age of Onset    . Hypertension Mother   . Coronary artery disease Brother   . Hypertension Sister     Past Surgical History:  Procedure Laterality Date  . ABDOMINAL HYSTERECTOMY  1990  . BREAST EXCISIONAL BIOPSY Right   . CARDIAC CATHETERIZATION  11/04/2008   patent RCA, LM, and Circ, nl EF  . CARDIAC CATHETERIZATION  02/20/2002   patent coronaries with the only abnormality being a smooth luminla irregularity in the mid intermediate ramus branch no felt to be hemodynamically significant, nl LV  . CARDIAC CATHETERIZATION N/A 02/05/2016   Procedure: Left Heart Cath and Coronary Angiography;  Surgeon: Leonie Man, MD;  Location: [facility];  Service: Cardiovascular;  Laterality: N/A;  . CARDIAC CATHETERIZATION N/A 02/05/2016   Procedure: Coronary Stent Intervention;  Surgeon: Leonie Man, MD;  Location: [facility];  Service: Cardiovascular;  Laterality: N/A;  . CATARACT EXTRACTION W/ INTRAOCULAR LENS  IMPLANT, BILATERAL  2013  . CORONARY ANGIOPLASTY WITH STENT PLACEMENT  04/07/2012   Xience Expedition DES  2.11mm x 18 mm (dilated to 3.0 mm) to the prox LAD   . CORONARY ANGIOPLASTY WITH STENT PLACEMENT  2013; 2015  . CORONARY ARTERY BYPASS GRAFT N/A 08/17/2017   Procedure: CORONARY ARTERY BYPASS GRAFTING (CABG) TIMES 1 USING LEFT INTERNAL MAMMARY ARTERY;  Surgeon: Melrose Nakayama, MD;  Location: [facility];  Service: Open Heart Surgery;  Laterality: N/A;  . DILATION AND CURETTAGE OF UTERUS  <1990  . ESOPHAGOGASTRODUODENOSCOPY N/A 05/23/2015   Procedure: ESOPHAGOGASTRODUODENOSCOPY (EGD);  Surgeon: Carol Ada, MD;  Location: [facility];  Service: Endoscopy;  Laterality: N/A;  . ESOPHAGOGASTRODUODENOSCOPY (EGD) WITH PROPOFOL N/A 06/13/2015   Procedure: ESOPHAGOGASTRODUODENOSCOPY (EGD) WITH PROPOFOL;  Surgeon: Carol Ada, MD;  Location: [facility];  Service: Endoscopy;  Laterality: N/A;  . ESOPHAGOGASTRODUODENOSCOPY (EGD) WITH PROPOFOL N/A 04/07/2018   Procedure:  ESOPHAGOGASTRODUODENOSCOPY (EGD) WITH PROPOFOL;  Surgeon: Carol Ada, MD;  [facility];  Service: Endoscopy;  Laterality: N/A;  fluoroscopy is needed  . FRACTURE SURGERY    . HAMMER TOE SURGERY Bilateral 1993   with screws, on screw removed  . JOINT REPLACEMENT    . KNEE ARTHROSCOPY Bilateral    left x2, right x1  . LAPAROSCOPIC CHOLECYSTECTOMY    . LEFT HEART CATH AND CORONARY ANGIOGRAPHY N/A 08/11/2017   Procedure: LEFT HEART CATH AND CORONARY ANGIOGRAPHY;  Surgeon: Martinique, Peter M, MD;  Location: [facility];  Service: Cardiovascular;  Laterality: N/A;  . LEFT HEART CATHETERIZATION WITH CORONARY ANGIOGRAM N/A 04/07/2012   Procedure: LEFT HEART CATHETERIZATION WITH CORONARY ANGIOGRAM;  Surgeon: [name], MD;  Location: [facility];  Service: Cardiovascular;  Laterality: N/A;  . LEFT HEART CATHETERIZATION WITH CORONARY ANGIOGRAM N/A 05/27/2014   Procedure: LEFT HEART CATHETERIZATION WITH CORONARY ANGIOGRAM;  Surgeon: [name], MD; LAD 99% ISR, CFX 50-60%, RCA (dominant) no sig dz, EF 60%  . LOWER EXTREMITY ANGIOGRAM  05/17/11   directional atherectomy to the prox L SFA using a LX Man TurboHawk, ballooned with a [employer] balloon   . ORIF ANKLE FRACTURE  07/09/2012   Procedure: OPEN REDUCTION INTERNAL FIXATION (ORIF) ANKLE FRACTURE;  Surgeon: Meredith Pel, MD;  [facility];  Service: Orthopedics;  Laterality: Left;  open reduction internal fixation trimalleolar ankle fracture medial malleolous fixation  . PERCUTANEOUS CORONARY STENT INTERVENTION (PCI-S) N/A 04/07/2012   Procedure: PERCUTANEOUS CORONARY STENT INTERVENTION (PCI-S);  Surgeon: [name], MD;  Location: [facility];  Service: Cardiovascular;  Laterality: N/A;  . PERCUTANEOUS CORONARY STENT INTERVENTION (PCI-S)  05/27/2014   Procedure: PERCUTANEOUS CORONARY STENT INTERVENTION (PCI-S);  Surgeon: [name], MD; 3 mm x 12 mm long Xience Xpedition DES to the proximal LAD  . SAVORY  DILATION N/A 06/13/2015   Procedure: SAVORY DILATION;  Surgeon: Carol Ada, MD;  [facility];  Service: Endoscopy;  Laterality: N/A;  . SAVORY DILATION N/A 04/07/2018   Procedure: SAVORY DILATION;  Surgeon: Carol Ada, MD;  [facility];  Service: Endoscopy;  Laterality: N/A;  . TEE WITHOUT CARDIOVERSION N/A 08/17/2017   Procedure: TRANSESOPHAGEAL ECHOCARDIOGRAM (TEE);  Surgeon: Melrose Nakayama, MD;  Location: [facility];  Service: Open Heart Surgery;  Laterality: N/A;  . TONSILLECTOMY    . TOTAL KNEE ARTHROPLASTY Left 2003  . TOTAL KNEE REVISION Left 11/05/2013   Procedure: LEFT TOTAL KNEE RESECTION;  Surgeon: Mauri Pole, MD;  [facility];  Service: Orthopedics;  Laterality: Left;  . TOTAL KNEE REVISION Left 2007   opened in 2006 and cleaned out,  2007 revision  . TOTAL KNEE REVISION Left 12/31/2013   Procedure: RE-INPLANTATION LEFT TOTAL KNEE ;  Surgeon: Mauri Pole, MD;  [facility];  Service: Orthopedics;  Laterality: Left;  Marland Kitchen VIDEO ASSISTED THORACOSCOPY (VATS)/ LOBECTOMY  01/2009   Social History   Occupational History  . Occupation: Retired  Tobacco Use  . Smoking status: Former Smoker    Packs/day: 1.00    Years: 20.00    Pack years: 20.00    Types: Cigarettes    Last attempt to quit: 09/27/1984    Years since quitting: 34.1  . Smokeless tobacco: Never Used  Substance and Sexual Activity  . Alcohol use: No  . Drug use: No  . Sexual activity: Not on file

## 2018-12-01 DIAGNOSIS — I2510 Atherosclerotic heart disease of native coronary artery without angina pectoris: Secondary | ICD-10-CM | POA: Diagnosis not present

## 2018-12-01 DIAGNOSIS — I129 Hypertensive chronic kidney disease with stage 1 through stage 4 chronic kidney disease, or unspecified chronic kidney disease: Secondary | ICD-10-CM | POA: Diagnosis not present

## 2018-12-01 DIAGNOSIS — E1065 Type 1 diabetes mellitus with hyperglycemia: Secondary | ICD-10-CM | POA: Diagnosis not present

## 2018-12-01 DIAGNOSIS — I5032 Chronic diastolic (congestive) heart failure: Secondary | ICD-10-CM | POA: Diagnosis not present

## 2018-12-01 DIAGNOSIS — E782 Mixed hyperlipidemia: Secondary | ICD-10-CM | POA: Diagnosis not present

## 2018-12-04 DIAGNOSIS — D509 Iron deficiency anemia, unspecified: Secondary | ICD-10-CM | POA: Diagnosis not present

## 2018-12-04 DIAGNOSIS — E1065 Type 1 diabetes mellitus with hyperglycemia: Secondary | ICD-10-CM | POA: Diagnosis not present

## 2018-12-04 DIAGNOSIS — Z794 Long term (current) use of insulin: Secondary | ICD-10-CM | POA: Diagnosis not present

## 2018-12-04 DIAGNOSIS — E1059 Type 1 diabetes mellitus with other circulatory complications: Secondary | ICD-10-CM | POA: Diagnosis not present

## 2018-12-05 ENCOUNTER — Ambulatory Visit: Payer: Medicare HMO | Admitting: Podiatry

## 2018-12-09 ENCOUNTER — Other Ambulatory Visit: Payer: Self-pay | Admitting: Cardiovascular Disease

## 2018-12-12 DIAGNOSIS — E1065 Type 1 diabetes mellitus with hyperglycemia: Secondary | ICD-10-CM | POA: Diagnosis not present

## 2018-12-21 ENCOUNTER — Ambulatory Visit: Payer: Medicare HMO | Admitting: Podiatry

## 2019-01-04 ENCOUNTER — Telehealth: Payer: Self-pay | Admitting: Pharmacist

## 2019-01-04 NOTE — Telephone Encounter (Signed)
LMOM at daughter cellphone.  Patient to call back and re-schedule INR check for [facility] drive-up.

## 2019-01-04 NOTE — Telephone Encounter (Signed)
Calling to change appt for INR @[facility]  to drive -up clinic at [facility] location. Unable to leave message.

## 2019-01-08 ENCOUNTER — Telehealth: Payer: Self-pay

## 2019-01-08 NOTE — Telephone Encounter (Signed)

## 2019-01-09 ENCOUNTER — Ambulatory Visit (INDEPENDENT_AMBULATORY_CARE_PROVIDER_SITE_OTHER): Payer: Medicare HMO

## 2019-01-09 ENCOUNTER — Other Ambulatory Visit: Payer: Self-pay

## 2019-01-09 DIAGNOSIS — I480 Paroxysmal atrial fibrillation: Secondary | ICD-10-CM | POA: Diagnosis not present

## 2019-01-09 DIAGNOSIS — Z7901 Long term (current) use of anticoagulants: Secondary | ICD-10-CM | POA: Diagnosis not present

## 2019-01-09 LAB — POCT INR: INR: 3.8 — AB (ref 2.0–3.0)

## 2019-01-09 NOTE — Patient Instructions (Signed)
Description   Called spoke with pt, advised to skip today's dosage of Coumadin, then resume same dosage 1 tablet daily except 1/2 tablet each Mondays, Wednesdays and Fridays.  Repeat INR in 3 weeks , pt usually a 6 weeker.

## 2019-01-18 DIAGNOSIS — H34811 Central retinal vein occlusion, right eye, with macular edema: Secondary | ICD-10-CM | POA: Diagnosis not present

## 2019-01-18 DIAGNOSIS — H35033 Hypertensive retinopathy, bilateral: Secondary | ICD-10-CM | POA: Diagnosis not present

## 2019-01-18 DIAGNOSIS — H26221 Cataract secondary to ocular disorders (degenerative) (inflammatory), right eye: Secondary | ICD-10-CM | POA: Diagnosis not present

## 2019-01-18 DIAGNOSIS — E113292 Type 2 diabetes mellitus with mild nonproliferative diabetic retinopathy without macular edema, left eye: Secondary | ICD-10-CM | POA: Diagnosis not present

## 2019-01-18 DIAGNOSIS — E113311 Type 2 diabetes mellitus with moderate nonproliferative diabetic retinopathy with macular edema, right eye: Secondary | ICD-10-CM | POA: Diagnosis not present

## 2019-01-18 DIAGNOSIS — Z961 Presence of intraocular lens: Secondary | ICD-10-CM | POA: Diagnosis not present

## 2019-01-18 DIAGNOSIS — H35011 Changes in retinal vascular appearance, right eye: Secondary | ICD-10-CM | POA: Diagnosis not present

## 2019-01-22 ENCOUNTER — Other Ambulatory Visit: Payer: Self-pay

## 2019-01-22 MED ORDER — PANTOPRAZOLE SODIUM 40 MG PO TBEC: 40.0000 mg | DELAYED_RELEASE_TABLET | Freq: Every day | ORAL | 1 refills | Status: DC

## 2019-01-25 DIAGNOSIS — K3184 Gastroparesis: Secondary | ICD-10-CM | POA: Diagnosis not present

## 2019-01-25 DIAGNOSIS — K5730 Diverticulosis of large intestine without perforation or abscess without bleeding: Secondary | ICD-10-CM | POA: Diagnosis not present

## 2019-01-25 DIAGNOSIS — D509 Iron deficiency anemia, unspecified: Secondary | ICD-10-CM | POA: Diagnosis not present

## 2019-01-25 DIAGNOSIS — K449 Diaphragmatic hernia without obstruction or gangrene: Secondary | ICD-10-CM | POA: Diagnosis not present

## 2019-01-25 DIAGNOSIS — R140 Abdominal distension (gaseous): Secondary | ICD-10-CM | POA: Diagnosis not present

## 2019-01-25 DIAGNOSIS — K219 Gastro-esophageal reflux disease without esophagitis: Secondary | ICD-10-CM | POA: Diagnosis not present

## 2019-01-25 DIAGNOSIS — R1033 Periumbilical pain: Secondary | ICD-10-CM | POA: Diagnosis not present

## 2019-01-29 ENCOUNTER — Telehealth: Payer: Self-pay

## 2019-01-29 NOTE — Telephone Encounter (Signed)

## 2019-01-30 ENCOUNTER — Other Ambulatory Visit: Payer: Self-pay

## 2019-01-30 ENCOUNTER — Ambulatory Visit (INDEPENDENT_AMBULATORY_CARE_PROVIDER_SITE_OTHER): Payer: Medicare HMO | Admitting: Pharmacist

## 2019-01-30 DIAGNOSIS — I480 Paroxysmal atrial fibrillation: Secondary | ICD-10-CM | POA: Diagnosis not present

## 2019-01-30 DIAGNOSIS — Z7901 Long term (current) use of anticoagulants: Secondary | ICD-10-CM | POA: Diagnosis not present

## 2019-01-30 LAB — POCT INR: INR: 3.6 — AB (ref 2.0–3.0)

## 2019-02-16 ENCOUNTER — Telehealth: Payer: Self-pay

## 2019-02-16 NOTE — Telephone Encounter (Signed)

## 2019-02-20 ENCOUNTER — Other Ambulatory Visit: Payer: Self-pay

## 2019-02-20 ENCOUNTER — Ambulatory Visit (INDEPENDENT_AMBULATORY_CARE_PROVIDER_SITE_OTHER): Payer: Medicare HMO | Admitting: Pharmacist

## 2019-02-20 DIAGNOSIS — Z7901 Long term (current) use of anticoagulants: Secondary | ICD-10-CM

## 2019-02-20 DIAGNOSIS — I480 Paroxysmal atrial fibrillation: Secondary | ICD-10-CM | POA: Diagnosis not present

## 2019-02-20 LAB — POCT INR: INR: 2.5 (ref 2.0–3.0)

## 2019-03-07 ENCOUNTER — Other Ambulatory Visit: Payer: Self-pay | Admitting: Cardiovascular Disease

## 2019-03-12 DIAGNOSIS — H3561 Retinal hemorrhage, right eye: Secondary | ICD-10-CM | POA: Diagnosis not present

## 2019-03-12 DIAGNOSIS — H34811 Central retinal vein occlusion, right eye, with macular edema: Secondary | ICD-10-CM | POA: Diagnosis not present

## 2019-03-12 DIAGNOSIS — Z961 Presence of intraocular lens: Secondary | ICD-10-CM | POA: Diagnosis not present

## 2019-03-12 DIAGNOSIS — E113311 Type 2 diabetes mellitus with moderate nonproliferative diabetic retinopathy with macular edema, right eye: Secondary | ICD-10-CM | POA: Diagnosis not present

## 2019-03-12 DIAGNOSIS — H26221 Cataract secondary to ocular disorders (degenerative) (inflammatory), right eye: Secondary | ICD-10-CM | POA: Diagnosis not present

## 2019-03-12 DIAGNOSIS — H35033 Hypertensive retinopathy, bilateral: Secondary | ICD-10-CM | POA: Diagnosis not present

## 2019-03-12 DIAGNOSIS — E113292 Type 2 diabetes mellitus with mild nonproliferative diabetic retinopathy without macular edema, left eye: Secondary | ICD-10-CM | POA: Diagnosis not present

## 2019-03-12 DIAGNOSIS — H35371 Puckering of macula, right eye: Secondary | ICD-10-CM | POA: Diagnosis not present

## 2019-03-13 ENCOUNTER — Telehealth: Payer: Self-pay

## 2019-03-13 NOTE — Telephone Encounter (Signed)

## 2019-03-14 DIAGNOSIS — E1065 Type 1 diabetes mellitus with hyperglycemia: Secondary | ICD-10-CM | POA: Diagnosis not present

## 2019-03-15 ENCOUNTER — Other Ambulatory Visit: Payer: Self-pay | Admitting: Internal Medicine

## 2019-03-15 DIAGNOSIS — Z1231 Encounter for screening mammogram for malignant neoplasm of breast: Secondary | ICD-10-CM

## 2019-03-16 ENCOUNTER — Other Ambulatory Visit: Payer: Self-pay | Admitting: *Deleted

## 2019-03-16 ENCOUNTER — Other Ambulatory Visit: Payer: Self-pay

## 2019-03-16 MED ORDER — WARFARIN SODIUM 7.5 MG PO TABS: ORAL_TABLET | ORAL | 0 refills | Status: DC

## 2019-03-16 MED ORDER — PANTOPRAZOLE SODIUM 40 MG PO TBEC: 40.0000 mg | DELAYED_RELEASE_TABLET | Freq: Every day | ORAL | 1 refills | Status: DC

## 2019-03-16 MED ORDER — METOPROLOL TARTRATE 25 MG PO TABS: 25.0000 mg | ORAL_TABLET | Freq: Two times a day (BID) | ORAL | 1 refills | Status: DC

## 2019-03-16 MED ORDER — SIMVASTATIN 40 MG PO TABS: 40.0000 mg | ORAL_TABLET | Freq: Every day | ORAL | 1 refills | Status: DC

## 2019-03-22 ENCOUNTER — Other Ambulatory Visit: Payer: Self-pay

## 2019-03-22 ENCOUNTER — Telehealth: Payer: Self-pay

## 2019-03-22 MED ORDER — PANTOPRAZOLE SODIUM 40 MG PO TBEC: 40.0000 mg | DELAYED_RELEASE_TABLET | Freq: Every day | ORAL | 1 refills | Status: DC

## 2019-03-22 MED ORDER — WARFARIN SODIUM 7.5 MG PO TABS: ORAL_TABLET | ORAL | 0 refills | Status: DC

## 2019-03-22 MED ORDER — SIMVASTATIN 40 MG PO TABS: 40.0000 mg | ORAL_TABLET | Freq: Every day | ORAL | 1 refills | Status: DC

## 2019-03-22 MED ORDER — METOPROLOL TARTRATE 25 MG PO TABS: 25.0000 mg | ORAL_TABLET | Freq: Two times a day (BID) | ORAL | 1 refills | Status: DC

## 2019-03-22 NOTE — Telephone Encounter (Signed)

## 2019-03-23 ENCOUNTER — Ambulatory Visit (INDEPENDENT_AMBULATORY_CARE_PROVIDER_SITE_OTHER): Payer: Medicare HMO | Admitting: *Deleted

## 2019-03-23 ENCOUNTER — Other Ambulatory Visit: Payer: Self-pay

## 2019-03-23 DIAGNOSIS — Z7901 Long term (current) use of anticoagulants: Secondary | ICD-10-CM | POA: Diagnosis not present

## 2019-03-23 DIAGNOSIS — I480 Paroxysmal atrial fibrillation: Secondary | ICD-10-CM | POA: Diagnosis not present

## 2019-03-23 LAB — POCT INR: INR: 2.2 (ref 2.0–3.0)

## 2019-03-23 NOTE — Patient Instructions (Signed)
Description   Continue taking 1 tablet daily except 1/2 tablet each Mondays, Wednesdays and Fridays. Repeat INR in 4 weeks.

## 2019-04-18 ENCOUNTER — Telehealth: Payer: Self-pay

## 2019-04-18 NOTE — Telephone Encounter (Signed)
Unable to lmom for prescreen no vmbox set up

## 2019-04-21 ENCOUNTER — Other Ambulatory Visit: Payer: Self-pay | Admitting: Cardiovascular Disease

## 2019-05-01 ENCOUNTER — Ambulatory Visit: Payer: Medicare HMO

## 2019-05-09 ENCOUNTER — Other Ambulatory Visit: Payer: Self-pay

## 2019-05-09 ENCOUNTER — Ambulatory Visit
Admission: RE | Admit: 2019-05-09 | Discharge: 2019-05-09 | Disposition: A | Discharge status: Home / Self Care | Payer: Medicare HMO | Source: Ambulatory Visit | Attending: Internal Medicine | Admitting: Internal Medicine

## 2019-05-09 DIAGNOSIS — Z1231 Encounter for screening mammogram for malignant neoplasm of breast: Secondary | ICD-10-CM | POA: Diagnosis not present

## 2019-05-10 ENCOUNTER — Ambulatory Visit (INDEPENDENT_AMBULATORY_CARE_PROVIDER_SITE_OTHER): Payer: Medicare HMO | Admitting: Pharmacist

## 2019-05-10 DIAGNOSIS — Z7901 Long term (current) use of anticoagulants: Secondary | ICD-10-CM

## 2019-05-10 DIAGNOSIS — I480 Paroxysmal atrial fibrillation: Secondary | ICD-10-CM

## 2019-05-10 LAB — POCT INR: INR: 3 (ref 2.0–3.0)

## 2019-05-29 DIAGNOSIS — Z23 Encounter for immunization: Secondary | ICD-10-CM | POA: Diagnosis not present

## 2019-05-29 DIAGNOSIS — I10 Essential (primary) hypertension: Secondary | ICD-10-CM | POA: Diagnosis not present

## 2019-05-29 DIAGNOSIS — Z7189 Other specified counseling: Secondary | ICD-10-CM | POA: Diagnosis not present

## 2019-05-29 DIAGNOSIS — E1059 Type 1 diabetes mellitus with other circulatory complications: Secondary | ICD-10-CM | POA: Diagnosis not present

## 2019-05-29 DIAGNOSIS — D509 Iron deficiency anemia, unspecified: Secondary | ICD-10-CM | POA: Diagnosis not present

## 2019-05-29 DIAGNOSIS — I129 Hypertensive chronic kidney disease with stage 1 through stage 4 chronic kidney disease, or unspecified chronic kidney disease: Secondary | ICD-10-CM | POA: Diagnosis not present

## 2019-05-29 DIAGNOSIS — Z0000 Encounter for general adult medical examination without abnormal findings: Secondary | ICD-10-CM | POA: Diagnosis not present

## 2019-05-29 DIAGNOSIS — E1065 Type 1 diabetes mellitus with hyperglycemia: Secondary | ICD-10-CM | POA: Diagnosis not present

## 2019-06-01 DIAGNOSIS — I482 Chronic atrial fibrillation, unspecified: Secondary | ICD-10-CM | POA: Diagnosis not present

## 2019-06-01 DIAGNOSIS — E10319 Type 1 diabetes mellitus with unspecified diabetic retinopathy without macular edema: Secondary | ICD-10-CM | POA: Diagnosis not present

## 2019-06-01 DIAGNOSIS — E1065 Type 1 diabetes mellitus with hyperglycemia: Secondary | ICD-10-CM | POA: Diagnosis not present

## 2019-06-01 DIAGNOSIS — I130 Hypertensive heart and chronic kidney disease with heart failure and stage 1 through stage 4 chronic kidney disease, or unspecified chronic kidney disease: Secondary | ICD-10-CM | POA: Diagnosis not present

## 2019-06-01 DIAGNOSIS — I700 Atherosclerosis of aorta: Secondary | ICD-10-CM | POA: Diagnosis not present

## 2019-06-01 DIAGNOSIS — I2510 Atherosclerotic heart disease of native coronary artery without angina pectoris: Secondary | ICD-10-CM | POA: Diagnosis not present

## 2019-06-01 DIAGNOSIS — I5032 Chronic diastolic (congestive) heart failure: Secondary | ICD-10-CM | POA: Diagnosis not present

## 2019-06-01 DIAGNOSIS — Z0000 Encounter for general adult medical examination without abnormal findings: Secondary | ICD-10-CM | POA: Diagnosis not present

## 2019-06-14 DIAGNOSIS — E1065 Type 1 diabetes mellitus with hyperglycemia: Secondary | ICD-10-CM | POA: Diagnosis not present

## 2019-07-05 ENCOUNTER — Ambulatory Visit (INDEPENDENT_AMBULATORY_CARE_PROVIDER_SITE_OTHER): Payer: Medicare HMO | Admitting: Pharmacist

## 2019-07-05 ENCOUNTER — Other Ambulatory Visit: Payer: Self-pay

## 2019-07-05 DIAGNOSIS — I480 Paroxysmal atrial fibrillation: Secondary | ICD-10-CM | POA: Diagnosis not present

## 2019-07-05 DIAGNOSIS — Z7901 Long term (current) use of anticoagulants: Secondary | ICD-10-CM

## 2019-07-05 LAB — POCT INR: INR: 2.6 (ref 2.0–3.0)

## 2019-07-17 ENCOUNTER — Ambulatory Visit: Payer: Medicare HMO | Admitting: Cardiovascular Disease

## 2019-07-19 ENCOUNTER — Telehealth: Payer: Self-pay | Admitting: Emergency Medicine

## 2019-07-19 DIAGNOSIS — Z20828 Contact with and (suspected) exposure to other viral communicable diseases: Secondary | ICD-10-CM | POA: Diagnosis not present

## 2019-07-19 NOTE — Telephone Encounter (Signed)
Call returned to patient, she states she usually gets a yearly CT. I  Made her aware it appears she has only been seen twice so did she have it done yearly prior to seeing [place]. She reports no she thought she was supposed to get it done yearly. I made her aware of RB documentation for last OV however she still requests to get CT. She denies any symptoms and states she would rather have one to be sure. I made her aware RB is not in clinic but I would get the message to [place] for his recommendations.   RB please advise if this patient needs to repeat her CT.

## 2019-07-20 NOTE — Telephone Encounter (Signed)
Called and spoke w/ pt with RB's recommendations. Pt verbalized understanding and inquired if she still needed a regular ROV. According to last office notes from 08/29/2018, pt needs a 1 year f/u appt. Pt agreed to scheduling an appt for 09/04/2019 at 2:30 PM with RB for a yearly f/u. Appt has been scheduled. Nothing further needed at this time.

## 2019-07-20 NOTE — Telephone Encounter (Signed)
Based on the size of her nodule (22mm) we don't usually follow serial scans. By our official criteria she shouldn't need one. Her lung cancer was 10 years ago, and she quit smoking over 30 years ago - so she is in the clear unless something changes.

## 2019-07-28 ENCOUNTER — Other Ambulatory Visit: Payer: Self-pay | Admitting: Cardiovascular Disease

## 2019-08-09 ENCOUNTER — Other Ambulatory Visit: Payer: Self-pay | Admitting: Cardiovascular Disease

## 2019-08-20 ENCOUNTER — Other Ambulatory Visit: Payer: Self-pay | Admitting: Cardiovascular Disease

## 2019-08-20 MED ORDER — METOPROLOL TARTRATE 25 MG PO TABS: 25.0000 mg | ORAL_TABLET | Freq: Two times a day (BID) | ORAL | 0 refills | Status: DC

## 2019-08-20 NOTE — Telephone Encounter (Signed)
Pt's medication was sent to [facility] order pharmacy as requested. Confirmation received.

## 2019-09-03 ENCOUNTER — Other Ambulatory Visit: Payer: Self-pay

## 2019-09-03 ENCOUNTER — Ambulatory Visit (INDEPENDENT_AMBULATORY_CARE_PROVIDER_SITE_OTHER): Payer: Medicare HMO | Admitting: Pharmacist

## 2019-09-03 DIAGNOSIS — I480 Paroxysmal atrial fibrillation: Secondary | ICD-10-CM | POA: Diagnosis not present

## 2019-09-03 DIAGNOSIS — Z7901 Long term (current) use of anticoagulants: Secondary | ICD-10-CM | POA: Diagnosis not present

## 2019-09-03 LAB — POCT INR: INR: 2.6 (ref 2.0–3.0)

## 2019-09-04 ENCOUNTER — Ambulatory Visit: Payer: Medicare HMO | Admitting: Emergency Medicine

## 2019-09-04 DIAGNOSIS — H26493 Other secondary cataract, bilateral: Secondary | ICD-10-CM | POA: Diagnosis not present

## 2019-09-04 DIAGNOSIS — H524 Presbyopia: Secondary | ICD-10-CM | POA: Diagnosis not present

## 2019-09-04 DIAGNOSIS — E113493 Type 2 diabetes mellitus with severe nonproliferative diabetic retinopathy without macular edema, bilateral: Secondary | ICD-10-CM | POA: Diagnosis not present

## 2019-09-13 DIAGNOSIS — E1065 Type 1 diabetes mellitus with hyperglycemia: Secondary | ICD-10-CM | POA: Diagnosis not present

## 2019-10-09 ENCOUNTER — Ambulatory Visit: Payer: HMO | Admitting: Emergency Medicine

## 2019-10-09 ENCOUNTER — Other Ambulatory Visit: Payer: Self-pay

## 2019-10-09 ENCOUNTER — Encounter: Payer: Self-pay | Admitting: Emergency Medicine

## 2019-10-09 DIAGNOSIS — R911 Solitary pulmonary nodule: Secondary | ICD-10-CM | POA: Diagnosis not present

## 2019-10-09 NOTE — Assessment & Plan Note (Signed)
2 mm nodule that was stable on a 1 year interval film in 2019.  Based on size criteria she should not need any repeat scans unless there is a clinical change.  We held off on a repeat CT this year.  She is outside the window for lung cancer screening having stopped smoking over 30 years ago.  I reassured her about this.  We will continue to follow annually to ensure no clinical change or indication to repeat her scan.

## 2019-10-09 NOTE — Progress Notes (Signed)
Subjective:    Patient ID: [name], female    DOB: Feb 17, 1951, 69 y.o.   MRN: [id number]  HPI 69 year old former smoker (20 pk-yrs) gt with a history of obesity, coronary disease, GERD with a hiatal hernia, atrial fibrillation, untreated obstructive sleep apnea, diabetes.  She has a history of left upper lobe lobectomy 2010 for stage I a non-small cell lung cancer, has been seen by Dr Gwenette Greet before, surgery by Dr Arlyce Dice. She underwent single vessel CABG in 07/2017 w Dr Roxan Hockey.   She underwent a cardiac CT prior to her CABG 07/2017 that I reviewed.  The lung windows show some bibasilar atelectasis and a 2 mm right lower lobe nodule.    ROV 08/29/18 --this is a follow-up visit for patient with history of tobacco use, obesity, coronary disease/CABG, GERD, held hernia, A. fib, untreated sleep apnea, diabetes as outlined above.  Importantly she has a history of a left upper lobe lobectomy in 2010 for stage Ia non-small cell lung cancer.  She is noted to have a noncalcified 2 mm right lower lobe nodule on cardiac CT scan.  Given her history of non-small cell lung cancer I recommended follow-up scanning.  This was done on 08/03/2018 and I have reviewed.  The 2 mm right lower lobe nodule is completely unchanged in size or appearance there are no other nodules seen.  There was some bilateral lower lobe atelectasis. She has stable exertional SOB. She benefited from cardiac rehab.   ROV 10/09/19 --69 year old woman with a history of CAD/CABG, GERD, hiatal hernia, atrial fibrillation, untreated OSA, diabetes.  She underwent left upper lobe lobectomy in 2010 for stage Ia non-small cell lung cancer, has 2 mm right lower lobe nodule that was stable on serial scanning, last 08/03/2018.  She has restriction, possible mixed obstruction on pulmonary function testing, last 2018. Not on any BD meds. She is somewhat limited, uses a rolling walker, but not by dyspnea. She does not have frequent cough, denies any  fevers, sweats or wt loss.    Review of Systems  Constitutional: Negative for fever and unexpected weight change.  HENT: Negative for congestion, dental problem, ear pain, nosebleeds, postnasal drip, rhinorrhea, sinus pressure, sneezing, sore throat and trouble swallowing.   Eyes: Negative for redness and itching.  Respiratory: Positive for shortness of breath. Negative for cough, chest tightness and wheezing.   Cardiovascular: Positive for palpitations. Negative for leg swelling.  Gastrointestinal: Negative for nausea and vomiting.  Genitourinary: Negative for dysuria.  Musculoskeletal: Negative for joint swelling.  Skin: Negative for rash.  Neurological: Negative for headaches.  Hematological: Does not bruise/bleed easily.  Psychiatric/Behavioral: Negative for dysphoric mood. The patient is not nervous/anxious.    Past Medical History:  Diagnosis Date  . Arthritis    "knees" (02/05/2016)  . Cancer ([place]) 5/10   LUL Vats   . Coronary artery disease   . GERD (gastroesophageal reflux disease)    tx meds  . H/O hiatal hernia   . History of blood transfusion "several"   last april 2015 s/p knee surgery (02/05/2016)  . Hypercholesteremia   . Hypertension   . Lung cancer ([place])    2010, surgery 18% left lung no radiation or chemo  . Non-STEMI (non-ST elevated myocardial infarction) ([place]) 04/07/2012   See cath results below  . Paroxysmal atrial fibrillation ([place]) 04/07/2012   On Warfarin  . Peripheral vascular disease ([place]) 8/12   Lt SFA PTA  . Presence of stent in LAD coronary artery 04/07/2012  Xience Expedition DES 2.75 mm x 18 mm (dilated to 3.0 mm)  . Sleep apnea    does not wear CPAP  . Type II diabetes mellitus (HCC)    insulin dependent     Family History  Problem Relation Age of Onset  . Hypertension Mother   . Coronary artery disease Brother   . Hypertension Sister      Social History   Socioeconomic History  . Marital status: Single    Spouse name: Not on file   . Number of children: Not on file  . Years of education: Not on file  . Highest education level: Not on file  Occupational History  . Occupation: Retired  Tobacco Use  . Smoking status: Former Smoker    Packs/day: 1.00    Years: 20.00    Pack years: 20.00    Types: Cigarettes    Quit date: 09/27/1984    Years since quitting: 35.0  . Smokeless tobacco: Never Used  Substance and Sexual Activity  . Alcohol use: No  . Drug use: No  . Sexual activity: Not on file  Other Topics Concern  . Not on file  Social History Narrative   Patient lives alone.   Social Determinants of Health   Financial Resource Strain:   . Difficulty of Paying Living Expenses: Not on file  Food Insecurity:   . Worried About Charity fundraiser in the Last Year: Not on file  . Ran Out of Food in the Last Year: Not on file  Transportation Needs:   . Lack of Transportation (Medical): Not on file  . Lack of Transportation (Non-Medical): Not on file  Physical Activity:   . Days of Exercise per Week: Not on file  . Minutes of Exercise per Session: Not on file  Stress:   . Feeling of Stress : Not on file  Social Connections:   . Frequency of Communication with Friends and Family: Not on file  . Frequency of Social Gatherings with Friends and Family: Not on file  . Attends Religious Services: Not on file  . Active Member of Clubs or Organizations: Not on file  . Attends Archivist Meetings: Not on file  . Marital Status: Not on file  Intimate Partner Violence:   . Fear of Current or Ex-Partner: Not on file  . Emotionally Abused: Not on file  . Physically Abused: Not on file  . 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 Levofloxacin Itching  . Morphine And Related Itching    Pt reports oxycodone history with no allergy.     Outpatient Medications Prior to Visit  Medication Sig Dispense Refill  . aspirin EC 81 MG  tablet Take 81 mg by mouth daily.    Marland Kitchen glimepiride (AMARYL) 2 MG tablet Take 4 mg by mouth daily with breakfast.     . insulin NPH-regular Human (NOVOLIN 70/30) (70-30) 100 UNIT/ML injection Inject 30-34 Units into the skin 2 (two) times daily with a meal. Prior to surgery, she took 42 units in the morning 20 units in the evening. As of 08/23/2017, she is on 20 units in am and 20 units in the pm. Her Insulin gradually needs to be increased to pre surgery doses as glucose levels allow. (Patient taking differently: Inject 20 Units into the skin 2 (two) times daily before a meal. 50 qam, 22 qpm) 10 mL 11  . lisinopril-hydrochlorothiazide (  PRINZIDE,ZESTORETIC) 20-25 MG per tablet Take 1 tablet by mouth daily.    . metoCLOPramide (REGLAN) 10 MG tablet Take 10 mg by mouth 2 (two) times daily. Per patient    . metoprolol tartrate (LOPRESSOR) 25 MG tablet Take 1 tablet (25 mg total) by mouth 2 (two) times daily. Please keep upcoming appt in January with Dr. Sallyanne Kuster before anymore refills. Thank you 180 tablet 0  . pantoprazole (PROTONIX) 40 MG tablet TAKE 1 TABLET EVERY DAY 90 tablet 1  . simvastatin (ZOCOR) 40 MG tablet Take 1 tablet (40 mg total) by mouth daily at 6 PM. 90 tablet 1  . Vitamin D, Cholecalciferol, 1000 units TABS Take 1,000 Units by mouth every morning.     . warfarin (COUMADIN) 7.5 MG tablet TAKE 1/2 TO 1 TABLET DAILY AS DIRECTED BY COUMADIN CLINIC 90 tablet 0  . Fe Fum-FePoly-Vit C-Vit B3 (INTEGRA) 62.5-62.5-40-3 MG CAPS Take 1 capsule by mouth daily.    . furosemide (LASIX) 40 MG tablet Take 1 tablet (40 mg total) by mouth daily. (Patient taking differently: Take 40 mg by mouth 3 (three) times a week. Take 40 mg Monday, Wednesday, and Friday each week.) 90 tablet 3  . potassium chloride (MICRO-K) 10 MEQ CR capsule TAKE 1 CAPSULE BY MOUTH EVERY DAY 90 capsule 1   No facility-administered medications prior to visit.        Objective:   Physical Exam Vitals:   10/09/19 1143  BP:  122/80  Pulse: 68  SpO2: 99%  Weight: 290 lb (131.5 kg)  Height: 5\' 3"  (1.6 m)   Gen: Pleasant, obese woman, in no distress,  normal affect, in a wheelchair  ENT: No lesions,  mouth clear,  oropharynx clear, no postnasal drip  Neck: No JVD, no stridor  Lungs: No use of accessory muscles, clear bilaterally  Cardiovascular: RRR, heart sounds normal, no murmur or gallops, no peripheral edema  Musculoskeletal: No deformities, no cyanosis or clubbing  Neuro: alert, non focal  Skin: Warm, no lesions or rash      Assessment & Plan:  Pulmonary nodule/lesion, solitary 2 mm nodule that was stable on a 1 year interval film in 2019.  Based on size criteria she should not need any repeat scans unless there is a clinical change.  We held off on a repeat CT this year.  She is outside the window for lung cancer screening having stopped smoking over 30 years ago.  I reassured her about this.  We will continue to follow annually to ensure no clinical change or indication to repeat her scan.  Baltazar Apo, MD, PhD 10/09/2019, 12:00 PM [facility] [phone] or if no answer [phone]

## 2019-10-09 NOTE — Patient Instructions (Signed)
We can hold off on repeating your CT scan of the chest at this time.  You should not need any formal lung cancer screening unless you begin to experience new symptoms like shortness of breath, persistent cough, persistent chest discomfort or pain.  Please call us if you develop any such symptoms. Follow with Dr. Lamonte Sakai in 12 months or sooner if you have any problems.

## 2019-10-12 DIAGNOSIS — E1065 Type 1 diabetes mellitus with hyperglycemia: Secondary | ICD-10-CM | POA: Diagnosis not present

## 2019-10-19 DIAGNOSIS — E10319 Type 1 diabetes mellitus with unspecified diabetic retinopathy without macular edema: Secondary | ICD-10-CM | POA: Diagnosis not present

## 2019-10-19 DIAGNOSIS — I5032 Chronic diastolic (congestive) heart failure: Secondary | ICD-10-CM | POA: Diagnosis not present

## 2019-10-19 DIAGNOSIS — E1065 Type 1 diabetes mellitus with hyperglycemia: Secondary | ICD-10-CM | POA: Diagnosis not present

## 2019-10-19 DIAGNOSIS — E782 Mixed hyperlipidemia: Secondary | ICD-10-CM | POA: Diagnosis not present

## 2019-10-23 ENCOUNTER — Other Ambulatory Visit: Payer: Self-pay | Admitting: *Deleted

## 2019-10-23 NOTE — Patient Outreach (Signed)
  [facility]) Care Management Chronic Special Needs Program    10/23/2019  Name: [name], DOB: 07/11/1951  MRN: [id number]   [name] is enrolled in a chronic special needs plan for Diabetes. Attempted to reach [name] at contact number to complete initial telephone assessment.  No answer and unable to leave voice mail as recording states the voice mailbox has not been set up.  Plan: Will attempt another outreach within one week.  Kelli Churn RN, CCM, [place] Management Coordinator [facility] Care Management [phone]

## 2019-10-24 ENCOUNTER — Ambulatory Visit (INDEPENDENT_AMBULATORY_CARE_PROVIDER_SITE_OTHER): Payer: HMO | Admitting: Pharmacist

## 2019-10-24 ENCOUNTER — Ambulatory Visit (INDEPENDENT_AMBULATORY_CARE_PROVIDER_SITE_OTHER): Payer: HMO | Admitting: Cardiovascular Disease

## 2019-10-24 ENCOUNTER — Other Ambulatory Visit: Payer: Self-pay

## 2019-10-24 VITALS — BP 150/86 | HR 91 | Temp 97.2°F | Ht 63.0 in | Wt 300.4 lb

## 2019-10-24 DIAGNOSIS — I2510 Atherosclerotic heart disease of native coronary artery without angina pectoris: Secondary | ICD-10-CM | POA: Diagnosis not present

## 2019-10-24 DIAGNOSIS — E669 Obesity, unspecified: Secondary | ICD-10-CM | POA: Diagnosis not present

## 2019-10-24 DIAGNOSIS — E7800 Pure hypercholesterolemia, unspecified: Secondary | ICD-10-CM

## 2019-10-24 DIAGNOSIS — I5033 Acute on chronic diastolic (congestive) heart failure: Secondary | ICD-10-CM

## 2019-10-24 DIAGNOSIS — I10 Essential (primary) hypertension: Secondary | ICD-10-CM

## 2019-10-24 DIAGNOSIS — I480 Paroxysmal atrial fibrillation: Secondary | ICD-10-CM

## 2019-10-24 DIAGNOSIS — E1169 Type 2 diabetes mellitus with other specified complication: Secondary | ICD-10-CM | POA: Diagnosis not present

## 2019-10-24 DIAGNOSIS — Z7901 Long term (current) use of anticoagulants: Secondary | ICD-10-CM

## 2019-10-24 DIAGNOSIS — I739 Peripheral vascular disease, unspecified: Secondary | ICD-10-CM

## 2019-10-24 DIAGNOSIS — N1831 Chronic kidney disease, stage 3a: Secondary | ICD-10-CM

## 2019-10-24 LAB — POCT INR: INR: 3.4 — AB (ref 2.0–3.0)

## 2019-10-24 MED ORDER — FUROSEMIDE 40 MG PO TABS: 40.0000 mg | ORAL_TABLET | Freq: Every day | ORAL | 3 refills | Status: DC

## 2019-10-24 MED ORDER — SPIRONOLACTONE 25 MG PO TABS: 25.0000 mg | ORAL_TABLET | Freq: Every day | ORAL | 3 refills | Status: DC

## 2019-10-24 NOTE — Progress Notes (Signed)
Patient ID: [name], female   DOB: [birth year], 69 y.o.   MRN: [id number]  Patient ID: [name], female   DOB: 1951-03-18, 69 y.o.   MRN: [id number]    Cardiology Office Note    Date:  10/26/2019   ID:  DENESIA DONELAN, DOB 09-01-1951, MRN [id number]  PCP:  [name], MD  Cardiologist:   Sanda Klein, MD   Chief Complaint  Patient presents with  . Congestive Heart Failure    History of Present Illness:  [name] is a 69 y.o. female with CAD and PAD, ischemic cardiomyopathy, CHF and paroxysmal atrial fibrillation.  She is now roughly 18 months following single-vessel LIMA to LAD bypass for recurrent in-stent restenosis multiple PCI.  She has a history of recurrent paroxysmal atrial fibrillation, diabetes mellitus requiring insulin, morbid obesity.    Recently she has had a little more problem with shortness of breath and has NYHA functional class III symptoms.  She becomes dyspneic dressing.  She has bilateral edema, little more prominent on the left.  Her weight is about 10 pounds higher than her average weight over the last year.  It sounds like she sometimes has orthopnea.  She denies angina and has not had dizziness or syncope.  Her blood pressure is higher than usual recently.  She reports that she had labs with Dr. Ashby Dawes recently and that her hemoglobin A1c had increased to 8.7%, but her cholesterol was good.  She remembers that the HDL and LDL were 49 and 55 (although she is not sure which was which).  She is doing her best to avoid salty foods, but is eating prepared meals more frequently recently, in the setting of the pandemic.  In 2013 she received a drug-eluting stent to the LAD artery. In July 2015 she had high-grade in-stent restenosis treated with balloon angioplasty and placement of a new 312 mm drug-eluting Xience expedition stent to the distal edge of the previous stent. Incidental note made of a 50-60% mid AV groove left  circumflex stenosis. In April 2017 she presented with chest pain and a nuclear study showed reversible ischemia after which she received another drug-eluting stent covering the 2 previous stents in the proximal LAD. In November 2018 she returned again with in-stent restenosis and was referred for single-vessel LIMA to LAD bypass surgery.She has normal left ventricular systolic function and no congestive heart failure.  In addition she has history of PAD status post atherectomy of the left superficial femoral artery and paroxysmal atrial fibrillation (on chronic warfarin anticoagulation). Additional comorbid conditions include hypertension, obesity, type 2 diabetes mellitus and gastroesophageal reflux disease. She has a history of left upper lobe lobectomy for adenocarcinoma of the lung with curative intent February 2010. She has a history of inflammatory esophagitis (noneosinophilic), stricture (which was dilated) and cervical web. (Dr. Benson Norway, EGD 2016 and re-dilated July 2019).   Past Medical History:  Diagnosis Date  . Arthritis    "knees" (02/05/2016)  . Cancer ([place]) 5/10   LUL Vats   . Coronary artery disease   . GERD (gastroesophageal reflux disease)    tx meds  . H/O hiatal hernia   . History of blood transfusion "several"   last april 2015 s/p knee surgery (02/05/2016)  . Hypercholesteremia   . Hypertension   . Lung cancer ([place])    2010, surgery 18% left lung no radiation or chemo  . Non-STEMI (non-ST elevated myocardial infarction) ([place]) 04/07/2012   See cath results below  .  Paroxysmal atrial fibrillation ([place]) 04/07/2012   On Warfarin  . Peripheral vascular disease ([place]) 8/12   Lt SFA PTA  . Presence of stent in LAD coronary artery 04/07/2012   Xience Expedition DES 2.75 mm x 18 mm (dilated to 3.0 mm)  . Sleep apnea    does not wear CPAP  . Type II diabetes mellitus (HCC)    insulin dependent    Past Surgical History:  Procedure Laterality Date  . ABDOMINAL HYSTERECTOMY  1990   . BREAST EXCISIONAL BIOPSY Right   . CARDIAC CATHETERIZATION  11/04/2008   patent RCA, LM, and Circ, nl EF  . CARDIAC CATHETERIZATION  02/20/2002   patent coronaries with the only abnormality being a smooth luminla irregularity in the mid intermediate ramus branch no felt to be hemodynamically significant, nl LV  . CARDIAC CATHETERIZATION N/A 02/05/2016   Procedure: Left Heart Cath and Coronary Angiography;  Surgeon: Leonie Man, MD;  Location: [facility];  Service: Cardiovascular;  Laterality: N/A;  . CARDIAC CATHETERIZATION N/A 02/05/2016   Procedure: Coronary Stent Intervention;  Surgeon: Leonie Man, MD;  Location: [facility];  Service: Cardiovascular;  Laterality: N/A;  . CATARACT EXTRACTION W/ INTRAOCULAR LENS  IMPLANT, BILATERAL  2013  . CORONARY ANGIOPLASTY WITH STENT PLACEMENT  04/07/2012   Xience Expedition DES 2.82mm x 18 mm (dilated to 3.0 mm) to the prox LAD   . CORONARY ANGIOPLASTY WITH STENT PLACEMENT  2013; 2015  . CORONARY ARTERY BYPASS GRAFT N/A 08/17/2017   Procedure: CORONARY ARTERY BYPASS GRAFTING (CABG) TIMES 1 USING LEFT INTERNAL MAMMARY ARTERY;  Surgeon: Melrose Nakayama, MD;  Location: [facility];  Service: Open Heart Surgery;  Laterality: N/A;  . DILATION AND CURETTAGE OF UTERUS  <1990  . ESOPHAGOGASTRODUODENOSCOPY N/A 05/23/2015   Procedure: ESOPHAGOGASTRODUODENOSCOPY (EGD);  Surgeon: Carol Ada, MD;  Location: [facility];  Service: Endoscopy;  Laterality: N/A;  . ESOPHAGOGASTRODUODENOSCOPY (EGD) WITH PROPOFOL N/A 06/13/2015   Procedure: ESOPHAGOGASTRODUODENOSCOPY (EGD) WITH PROPOFOL;  Surgeon: Carol Ada, MD;  [facility];  Service: Endoscopy;  Laterality: N/A;  . ESOPHAGOGASTRODUODENOSCOPY (EGD) WITH PROPOFOL N/A 04/07/2018   Procedure: ESOPHAGOGASTRODUODENOSCOPY (EGD) WITH PROPOFOL;  Surgeon: Carol Ada, MD;  [facility];  Service: Endoscopy;  Laterality: N/A;  fluoroscopy is needed  . FRACTURE SURGERY    . HAMMER  TOE SURGERY Bilateral 1993   with screws, on screw removed  . JOINT REPLACEMENT    . KNEE ARTHROSCOPY Bilateral    left x2, right x1  . LAPAROSCOPIC CHOLECYSTECTOMY    . LEFT HEART CATH AND CORONARY ANGIOGRAPHY N/A 08/11/2017   Procedure: LEFT HEART CATH AND CORONARY ANGIOGRAPHY;  Surgeon: Martinique, Peter M, MD;  Location: [facility];  Service: Cardiovascular;  Laterality: N/A;  . LEFT HEART CATHETERIZATION WITH CORONARY ANGIOGRAM N/A 04/07/2012   Procedure: LEFT HEART CATHETERIZATION WITH CORONARY ANGIOGRAM;  Surgeon: [name], MD;  Location: [facility] C[facility] LAB;  Service: Cardiovascular;  Laterality: N/A;  . LEFT HEART CATHETERIZATION WITH CORONARY ANGIOGRAM N/A 05/27/2014   Procedure: LEFT HEART CATHETERIZATION WITH CORONARY ANGIOGRAM;  Surgeon: [name], MD; LAD 99% ISR, CFX 50-60%, RCA (dominant) no sig dz, EF 60%  . LOWER EXTREMITY ANGIOGRAM  05/17/11   directional atherectomy to the prox L SFA using a LX Man TurboHawk, ballooned with a [employer] balloon   . ORIF ANKLE FRACTURE  07/09/2012   Procedure: OPEN REDUCTION INTERNAL FIXATION (ORIF) ANKLE FRACTURE;  Surgeon: Meredith Pel, MD;  Location: [facility];  Service: Orthopedics;  Laterality: Left;  open reduction internal fixation trimalleolar ankle fracture medial malleolous fixation  . PERCUTANEOUS CORONARY STENT INTERVENTION (PCI-S) N/A 04/07/2012   Procedure: PERCUTANEOUS CORONARY STENT INTERVENTION (PCI-S);  Surgeon: [name], MD;  Location: [facility] CATH LAB;  Service: Cardiovascular;  Laterality: N/A;  . PERCUTANEOUS CORONARY STENT INTERVENTION (PCI-S)  05/27/2014   Procedure: PERCUTANEOUS CORONARY STENT INTERVENTION (PCI-S);  Surgeon: [name], MD; 3 mm x 12 mm long Xience Xpedition DES to the proximal LAD  . SAVORY DILATION N/A 06/13/2015   Procedure: SAVORY DILATION;  Surgeon: Carol Ada, MD;  [facility];  Service: Endoscopy;  Laterality: N/A;  . SAVORY DILATION N/A 04/07/2018   Procedure:  SAVORY DILATION;  Surgeon: Carol Ada, MD;  [facility];  Service: Endoscopy;  Laterality: N/A;  . TEE WITHOUT CARDIOVERSION N/A 08/17/2017   Procedure: TRANSESOPHAGEAL ECHOCARDIOGRAM (TEE);  Surgeon: Melrose Nakayama, MD;  Location: [facility];  Service: Open Heart Surgery;  Laterality: N/A;  . TONSILLECTOMY    . TOTAL KNEE ARTHROPLASTY Left 2003  . TOTAL KNEE REVISION Left 11/05/2013   Procedure: LEFT TOTAL KNEE RESECTION;  Surgeon: Mauri Pole, MD;  [facility];  Service: Orthopedics;  Laterality: Left;  . TOTAL KNEE REVISION Left 2007   opened in 2006 and cleaned out, 2007 revision  . TOTAL KNEE REVISION Left 12/31/2013   Procedure: RE-INPLANTATION LEFT TOTAL KNEE ;  Surgeon: Mauri Pole, MD;  [facility];  Service: Orthopedics;  Laterality: Left;  Marland Kitchen VIDEO ASSISTED THORACOSCOPY (VATS)/ LOBECTOMY  01/2009    Current Medications: Outpatient Medications Prior to Visit  Medication Sig Dispense Refill  . glimepiride (AMARYL) 2 MG tablet Take 4 mg by mouth daily with breakfast.     . insulin NPH-regular Human (NOVOLIN 70/30) (70-30) 100 UNIT/ML injection Inject 30-34 Units into the skin 2 (two) times daily with a meal. Prior to surgery, she took 42 units in the morning 20 units in the evening. As of 08/23/2017, she is on 20 units in am and 20 units in the pm. Her Insulin gradually needs to be increased to pre surgery doses as glucose levels allow. (Patient taking differently: Inject 20 Units into the skin 2 (two) times daily before a meal. 50 qam, 22 qpm) 10 mL 11  . lisinopril-hydrochlorothiazide (PRINZIDE,ZESTORETIC) 20-25 MG per tablet Take 1 tablet by mouth daily.    . metoCLOPramide (REGLAN) 10 MG tablet Take 10 mg by mouth 2 (two) times daily. Per patient    . metoprolol tartrate (LOPRESSOR) 25 MG tablet Take 1 tablet (25 mg total) by mouth 2 (two) times daily. Please keep upcoming appt in January with Dr. Sallyanne Kuster before anymore refills. Thank you 180 tablet 0  .  pantoprazole (PROTONIX) 40 MG tablet TAKE 1 TABLET EVERY DAY 90 tablet 1  . simvastatin (ZOCOR) 40 MG tablet Take 1 tablet (40 mg total) by mouth daily at 6 PM. 90 tablet 1  . Vitamin D, Cholecalciferol, 1000 units TABS Take 1,000 Units by mouth every morning.     . warfarin (COUMADIN) 7.5 MG tablet TAKE 1/2 TO 1 TABLET DAILY AS DIRECTED BY COUMADIN CLINIC 90 tablet 0  . aspirin EC 81 MG tablet Take 81 mg by mouth daily.     No facility-administered medications prior to visit.     Allergies:   Levofloxacin and Morphine and related   Social History   Socioeconomic History  . Marital status: Single    Spouse name:  Not on file  . Number of children: Not on file  . Years of education: Not on file  . Highest education level: Not on file  Occupational History  . Occupation: Retired  Tobacco Use  . Smoking status: Former Smoker    Packs/day: 1.00    Years: 20.00    Pack years: 20.00    Types: Cigarettes    Quit date: 09/27/1984    Years since quitting: 35.1  . Smokeless tobacco: Never Used  Substance and Sexual Activity  . Alcohol use: No  . Drug use: No  . Sexual activity: Not on file  Other Topics Concern  . Not on file  Social History Narrative   Patient lives alone.   Social Determinants of Health   Financial Resource Strain:   . Difficulty of Paying Living Expenses: Not on file  Food Insecurity:   . Worried About Charity fundraiser in the Last Year: Not on file  . Ran Out of Food in the Last Year: Not on file  Transportation Needs:   . Lack of Transportation (Medical): Not on file  . Lack of Transportation (Non-Medical): Not on file  Physical Activity:   . Days of Exercise per Week: Not on file  . Minutes of Exercise per Session: Not on file  Stress:   . Feeling of Stress : Not on file  Social Connections:   . Frequency of Communication with Friends and Family: Not on file  . Frequency of Social Gatherings with Friends and Family: Not on file  . Attends Religious  Services: Not on file  . Active Member of Clubs or Organizations: Not on file  . Attends Archivist Meetings: Not on file  . Marital Status: Not on file     Family History:  The patient's family history includes Coronary artery disease in her brother; Hypertension in her mother and sister.   ROS:   Please see the history of present illness.    All other systems are reviewed and are negative.   PHYSICAL EXAM:   VS:  BP (!) 150/86   Pulse 91   Temp (!) 97.2 F (36.2 C)   Ht 5\' 3"  (1.6 m)   Wt (!) 300 lb 6.4 oz (136.3 kg)   SpO2 99%   BMI 53.21 kg/m      General: Alert, oriented x3, no distress, she is super obese, which seriously limits the physical exam Head: no evidence of trauma, PERRL, EOMI, no exophtalmos or lid lag, no myxedema, no xanthelasma; normal ears, nose and oropharynx Neck: I cannot evaluate her jugular venous pulsations and her hepatojugular reflux; brisk carotid pulses without delay and no carotid bruits Chest: clear to auscultation, no signs of consolidation by percussion or palpation, normal fremitus, symmetrical and full respiratory excursions Cardiovascular: normal position and quality of the apical impulse, regular rhythm, normal first and second heart sounds, no murmurs, rubs or gallops Abdomen: no tenderness or distention, no masses by palpation, no abnormal pulsatility or arterial bruits, normal bowel sounds, no hepatosplenomegaly Extremities: no clubbing, cyanosis, she has 2+ ankle swelling a little more prominent on the left than on the right; 2+ radial, ulnar and brachial pulses bilaterally; 2+ right femoral, posterior tibial and dorsalis pedis pulses; 2+ left femoral, posterior tibial and dorsalis pedis pulses; no subclavian or femoral bruits Neurological: grossly nonfocal Psych: Normal mood and affect    Wt Readings from Last 3 Encounters:  10/24/19 (!) 300 lb 6.4 oz (136.3 kg)  10/09/19 290  lb (131.5 kg)  08/29/18 290 lb (131.5 kg)       Studies/Labs Reviewed:   EKG:  EKG is ordered today.  There appears to be an underlying regular rhythm with occasional irregularity.  It probably represents sinus rhythm with occasional PACs (but I am not entirely sure this is not atrial flutter with variable AV block), old right bundle branch block and left anterior fascicular block.  QRS 152 ms, QTC 484 ms.  Recent Labs: 10/19/2016 Potassium 3.9, creatinine 1.0, normal LFTs, normal TSH, hemoglobin 10.6 (02/06/2016 BMET    Component Value Date/Time   NA 133 (L) 09/23/2017 2016   NA 138 08/30/2017 1607   NA 137 09/15/2015 0959   K 3.5 09/23/2017 2016   K 4.0 09/15/2015 0959   CL 95 (L) 09/23/2017 2016   CL 99 09/12/2012 0959   CO2 27 09/23/2017 2016   CO2 27 09/15/2015 0959   GLUCOSE 240 (H) 09/23/2017 2016   GLUCOSE 206 (H) 09/15/2015 0959   GLUCOSE 217 (H) 09/12/2012 0959   BUN 20 09/23/2017 2016   BUN 22 08/30/2017 1607   BUN 16.1 09/15/2015 0959   CREATININE 1.20 (H) 09/23/2017 2016   CREATININE 1.03 (H) 01/30/2016 1058   CREATININE 1.2 (H) 09/15/2015 0959   CALCIUM 9.3 09/23/2017 2016   CALCIUM 9.9 09/15/2015 0959   GFRNONAA 46 (L) 09/23/2017 2016   GFRAA 53 (L) 09/23/2017 2016    Lipid Panel    Component Value Date/Time   CHOL 133 08/10/2017 1141   TRIG 78 08/10/2017 1141   HDL 46 08/10/2017 1141   CHOLHDL 2.9 08/10/2017 1141   VLDL 16 08/10/2017 1141   LDLCALC 71 08/10/2017 1141   LDLDIRECT 143 (H) 10/10/2008 2042   10/19/2016 LDL 64, HDL 49, total cholesterol 130, triglycerides 83   ASSESSMENT:    1. Acute on chronic diastolic (congestive) heart failure ([place])   2. PAF (paroxysmal atrial fibrillation) ([place])   3. Coronary artery disease involving native coronary artery of native heart without angina pectoris   4. Essential hypertension   5. Diabetes mellitus type 2 in obese ([place])   6. Hypercholesterolemia   7. Super obese   8. PAD (peripheral artery disease) (HCC)   9. Stage 3a chronic kidney  disease      PLAN:  In order of problems listed above:  1. CHF: She has signs and symptoms of heart failure exacerbation/hypervolemia.  We will add a loop diuretic and spironolactone and check labs in a few weeks.  Target weight 290 pounds or less. 2. PAF: A little difficult to tell what her rhythm is today.  I think it sinus rhythm with PACs but cannot entirely exclude atrial flutter.  Regardless it is well rate controlled and she is not aware of any palpitations.  In the past the arrhythmia did not appear to be the cause of heart failure exacerbation.  This diagnosis precedes the bypass procedure.  CHADSVasc 6 (age, gender, DM, CHF, CAD/PAD, HTN). 3. Anticoagulation: On warfarin with PT/INR usually very well controlled in therapeutic range, mildly supratherapeutic today.  Stop aspirin to reduce bleeding risk. 4. CAD s/p CABG: She does not have angina, roughly 1-1/2 years after single-vessel LIMA bypass to the LAD.  She is very sedentary.   5. HTN: Previously well controlled on the current medications, now hypertensive.  Should improve with diuresis and spironolactone. 6. DM: We will get her most recent labs from Dr. Ashby Dawes.  I still think she would be an excellent candidate for  SGLT2 inhibitors if he agrees. 7. HLP: If she recalls correctly that her LDL was somewhere in the 49-55 range, it is under excellent control and would continue current statin dose. 8. Obesity: Super obese, this is associated with many of her other comorbid conditions. 9. PAD: No claudication for her activity level. 10. CKD 3: Request labs from Dr. Ashby Dawes.  Recheck renal function parameters and potassium on spironolactone and furosemide.  The most recent creatinine that I have on file was not bad at all at 1.09 and potassium was 4.3   Medication Adjustments/Labs and Tests Ordered: Current medicines are reviewed at length with the patient today.  Concerns regarding medicines are outlined above.  Medication  changes, Labs and Tests ordered today are listed in the Patient Instructions below. Patient Instructions  Medication Instructions:  STOP the Aspirin START Furosemide 40 mg once daily START Spironolactone 25 mg once daily  *If you need a refill on your cardiac medications before your next appointment, please call your pharmacy*  Lab Work: Your provider would like for you to return in one month to have the following labs drawn: BNP and BMET. You do not need an appointment for the lab. Once in our office lobby there is a podium where you can sign in and ring the doorbell to alert [place] that you are here. The lab is open from 8:00 am to 4:30 pm; closed for lunch from 12:45pm-1:45pm.  If you have labs (blood work) drawn today and your tests are completely normal, you will receive your results only by: Marland Kitchen MyChart Message (if you have MyChart) OR . A paper copy in the mail If you have any lab test that is abnormal or we need to change your treatment, we will call you to review the results.  Testing/Procedures: None ordered  Follow-Up: At [facility], you and your health needs are our priority.  As part of our continuing mission to provide you with exceptional heart care, we have created designated Provider Care Teams.  These Care Teams include your primary Cardiologist (physician) and Advanced Practice Providers (APPs -  Physician Assistants and Nurse Practitioners) who all work together to provide you with the care you need, when you need it.  Your next appointment:   1 month(s)  The format for your next appointment:   In Person  Provider:   You may see Sanda Klein, MD or one of the following Advanced Practice Providers on your designated Care Team:    Almyra Deforest, PA-C  Fabian Sharp, [place] or   Roby Lofts, PA-C     Signed, Sanda Klein, MD  10/26/2019 6:29 PM    [address], [place], [place]  [address] Phone: [phone]; Fax: [phone]

## 2019-10-24 NOTE — Patient Instructions (Signed)
Medication Instructions:  STOP the Aspirin START Furosemide 40 mg once daily START Spironolactone 25 mg once daily  *If you need a refill on your cardiac medications before your next appointment, please call your pharmacy*  Lab Work: Your provider would like for you to return in one month to have the following labs drawn: BNP and BMET. You do not need an appointment for the lab. Once in our office lobby there is a podium where you can sign in and ring the doorbell to alert [place] that you are here. The lab is open from 8:00 am to 4:30 pm; closed for lunch from 12:45pm-1:45pm.  If you have labs (blood work) drawn today and your tests are completely normal, you will receive your results only by: Marland Kitchen MyChart Message (if you have MyChart) OR . A paper copy in the mail If you have any lab test that is abnormal or we need to change your treatment, we will call you to review the results.  Testing/Procedures: None ordered  Follow-Up: At [facility], you and your health needs are our priority.  As part of our continuing mission to provide you with exceptional heart care, we have created designated Provider Care Teams.  These Care Teams include your primary Cardiologist (physician) and Advanced Practice Providers (APPs -  Physician Assistants and Nurse Practitioners) who all work together to provide you with the care you need, when you need it.  Your next appointment:   1 month(s)  The format for your next appointment:   In Person  Provider:   You may see Sanda Klein, MD or one of the following Advanced Practice Providers on your designated Care Team:    Almyra Deforest, PA-C  Fabian Sharp, PA-C or   Roby Lofts, [place]

## 2019-10-26 ENCOUNTER — Encounter: Payer: Self-pay | Admitting: Cardiovascular Disease

## 2019-10-30 ENCOUNTER — Ambulatory Visit: Payer: Self-pay | Admitting: *Deleted

## 2019-10-31 ENCOUNTER — Ambulatory Visit: Payer: Self-pay | Admitting: *Deleted

## 2019-10-31 ENCOUNTER — Other Ambulatory Visit: Payer: Self-pay | Admitting: *Deleted

## 2019-10-31 NOTE — Patient Outreach (Signed)
  [facility]) Care Management Chronic Special Needs Program    10/31/2019  Name: [name], DOB: [birth year]  MRN: [id number]   [name] is enrolled in a chronic special needs plan for Diabetes. Second attempt to reach [name] at contact number to complete initial telephone assessment.  No answer and unable to leave voice mail as recording states the voice mailbox has not been set up.  Plan: Will attempt third outreach within one week.  Kelli Churn RN, CCM, [place] Management Coordinator [facility] Care Management [phone]

## 2019-11-07 ENCOUNTER — Other Ambulatory Visit: Payer: Self-pay | Admitting: *Deleted

## 2019-11-07 ENCOUNTER — Ambulatory Visit: Payer: Self-pay | Admitting: *Deleted

## 2019-11-07 NOTE — Patient Outreach (Addendum)
  [facility]) Care Management Chronic Special Needs Program   11/07/2019  Name: JIALI LINNEY, DOB: 05-01-1951  MRN: [id number]  The client was discussed in today's interdisciplinary care team meeting.  The following issues were discussed:  Key risk triggers/risk stratification and Issues/barriers to care  Participants present:  Thea Silversmith, MSN, RN, CCM  Melissa Sandlin RN,BSN,CCM, CDCES  Kelli Churn, RN, CCM, CDCES Quinn Plowman RN, BSN, CCM  Maryella Shivers, MD  Bary Castilla, RN, BSN, MS, CCM Coralie Carpen, MD [place] Team: Babs Bertin, RN; Teresa Pelton, RN, BSN  Recommendations:  Per Dr. Nyra Capes' suggestion, secure e-mail sent to [employer] Concierge requesting assistance in contacting client so that initial assessment may be completed.   Plan: Develop individualized care plan using available data and send unsuccessful outreach letter to client if unable to successfully contact client.   Follow-up: RNCM will follow up as scheduled as per [place] tier level  Kelli Churn RN, CCM, [place] Management Coordinator [place] Management [phone]

## 2019-11-07 NOTE — Patient Outreach (Signed)
  [facility]) Care Management Chronic Special Needs Program    11/07/2019  Name: CHARLAYNE VULTAGGIO, DOB: Apr 26, 1951  MRN: [id number]   Ms. Juliet Vasbinder is enrolled in a chronic special needs plan for Diabetes. Third attempt to reach Mrs. Schweiger at contact number to complete initial telephone assessment.  No answer and unable to leave voice mail as recording states the voice mailbox has not been set up.  Plan: Will contact client's  [employer] concierge to elicit help in contacting the client. If unsuccessful in reaching client, will develop care plan based on available data.   Kelli Churn RN, CCM, [place] Management Coordinator [facility] Care Management [phone]

## 2019-11-09 ENCOUNTER — Other Ambulatory Visit: Payer: Self-pay | Admitting: *Deleted

## 2019-11-09 NOTE — Patient Outreach (Signed)
  [place] [facility]) Care Management Chronic Special Needs Program    11/09/2019  Name: [name], DOB: 1951-08-30  MRN: [id number]   [name] is enrolled in a chronic special needs plan for Diabetes. Received reply secure e-mail from [place] in response to a secure e-mail sent to her by this RNCM on 11/07/19.  Loma Sousa states she made 3 attempts to reach the member and was unsuccessful.   Plan: Will proceed with developing client's individualized care plan base on available data.  Kelli Churn RN, CCM, [place] Management Coordinator [facility] Care Management [phone]

## 2019-11-14 ENCOUNTER — Other Ambulatory Visit: Payer: Self-pay | Admitting: *Deleted

## 2019-11-14 ENCOUNTER — Encounter: Payer: Self-pay | Admitting: *Deleted

## 2019-11-14 NOTE — Patient Outreach (Signed)
  [place] [facility]) Care Management Chronic Special Needs Program  11/14/2019  Name: [name] DOB: 03/22/1951  MRN: [id number]  [name] is enrolled in a chronic special needs plan for Diabetes. Client has not responded to three outreach attempts. [facility] [employer] Concierge also made 3 attempts to reach client and was unsuccessful.   The client's individualized care plan was developed based upon the completed health risk assessment. Plan:   . Send unsuccessful outreach letter with a copy of individualized care plan to client . Send individualized care plan to provider  Chronic care management coordinator will attempt outreach in 3 Months. Referrals were made to social worker for stress management, transportation, meal assistance and AD/IADL assistance and to pharmacist for medication reconciliation, assisting client with getting medication from pharmacy. Will mail educational material on heart failure, diabetes and blood pressure, how to use a walker and cane, and heart disease in people with diabetes to client's home address..  Will also mail a spiral bound [employer] C-[employer] calendar to client's home address.    Kelli Churn RN, CCM, [place] Management Coordinator [facility] Care Management [phone]

## 2019-11-16 ENCOUNTER — Other Ambulatory Visit: Payer: Self-pay

## 2019-11-16 ENCOUNTER — Ambulatory Visit: Payer: HMO | Admitting: Physician Assistant

## 2019-11-16 ENCOUNTER — Encounter (INDEPENDENT_AMBULATORY_CARE_PROVIDER_SITE_OTHER): Payer: Self-pay

## 2019-11-16 ENCOUNTER — Ambulatory Visit (INDEPENDENT_AMBULATORY_CARE_PROVIDER_SITE_OTHER): Payer: HMO | Admitting: Pharmacist Clinician (PhC)/ Clinical Pharmacy Specialist

## 2019-11-16 DIAGNOSIS — Z7901 Long term (current) use of anticoagulants: Secondary | ICD-10-CM | POA: Diagnosis not present

## 2019-11-16 DIAGNOSIS — I5032 Chronic diastolic (congestive) heart failure: Secondary | ICD-10-CM | POA: Diagnosis not present

## 2019-11-16 DIAGNOSIS — I480 Paroxysmal atrial fibrillation: Secondary | ICD-10-CM

## 2019-11-16 LAB — POCT INR: INR: 3.4 — AB (ref 2.0–3.0)

## 2019-11-17 LAB — BASIC METABOLIC PANEL
BUN/Creatinine Ratio: 10 — ABNORMAL LOW (ref 12–28)
BUN: 12 mg/dL (ref 8–27)
CO2: 21 mmol/L (ref 20–29)
Calcium: 9.9 mg/dL (ref 8.7–10.3)
Chloride: 97 mmol/L (ref 96–106)
Creatinine, Ser: 1.21 mg/dL — ABNORMAL HIGH (ref 0.57–1.00)
GFR calc Af Amer: 52 mL/min/{1.73_m2} — ABNORMAL LOW (ref >59)
GFR calc non Af Amer: 45 mL/min/{1.73_m2} — ABNORMAL LOW (ref >59)
Glucose: 204 mg/dL — ABNORMAL HIGH (ref 65–99)
Potassium: 4.2 mmol/L (ref 3.5–5.2)
Sodium: 137 mmol/L (ref 134–144)

## 2019-11-17 LAB — BRAIN NATRIURETIC PEPTIDE: BNP: 79.6 pg/mL (ref 0.0–100.0)

## 2019-11-19 ENCOUNTER — Telehealth: Payer: Self-pay | Admitting: *Deleted

## 2019-11-19 NOTE — Telephone Encounter (Signed)
Attempted to reach the patient. Voicemail has not been set up.

## 2019-11-19 NOTE — Telephone Encounter (Signed)
Patient made aware of results and verbalized understanding.  She would like to know if she still needs to follow up this month or if she can come in later. She would also like to know if she should continue the Furosemide 40 mg daily and Spironolactone 25 mg daily. She currently denies any shortness of breath or edema. She does not weight over self on a regular basis so was unsure of her current weight.

## 2019-11-19 NOTE — Telephone Encounter (Signed)
He feels like her breathing improved after taking those two medications?  If so yes she should continue taking the furosemide and spironolactone.

## 2019-11-19 NOTE — Telephone Encounter (Signed)
-----   Message from Sanda Klein, MD sent at 11/17/2019  6:03 PM EST ----- No evidence of heart failure exacerbation/fluid overload by BNP

## 2019-11-22 NOTE — Telephone Encounter (Signed)
Attempted to reach the patient. Voicemail has not been set up.

## 2019-11-28 NOTE — Telephone Encounter (Signed)
Patient made aware and verbalized her understanding. 

## 2019-12-07 ENCOUNTER — Other Ambulatory Visit: Payer: Self-pay

## 2019-12-07 ENCOUNTER — Encounter (INDEPENDENT_AMBULATORY_CARE_PROVIDER_SITE_OTHER): Payer: Self-pay

## 2019-12-07 ENCOUNTER — Ambulatory Visit (INDEPENDENT_AMBULATORY_CARE_PROVIDER_SITE_OTHER): Payer: HMO | Admitting: Pharmacist

## 2019-12-07 DIAGNOSIS — I480 Paroxysmal atrial fibrillation: Secondary | ICD-10-CM | POA: Diagnosis not present

## 2019-12-07 DIAGNOSIS — Z7901 Long term (current) use of anticoagulants: Secondary | ICD-10-CM

## 2019-12-07 LAB — POCT INR: INR: 3.5 — AB (ref 2.0–3.0)

## 2019-12-28 ENCOUNTER — Other Ambulatory Visit: Payer: Self-pay

## 2019-12-28 ENCOUNTER — Ambulatory Visit (INDEPENDENT_AMBULATORY_CARE_PROVIDER_SITE_OTHER): Payer: HMO | Admitting: Pharmacist Clinician (PhC)/ Clinical Pharmacy Specialist

## 2019-12-28 DIAGNOSIS — I480 Paroxysmal atrial fibrillation: Secondary | ICD-10-CM | POA: Diagnosis not present

## 2019-12-28 DIAGNOSIS — Z7901 Long term (current) use of anticoagulants: Secondary | ICD-10-CM | POA: Diagnosis not present

## 2019-12-28 LAB — POCT INR: INR: 2.8 (ref 2.0–3.0)

## 2020-01-14 ENCOUNTER — Other Ambulatory Visit: Payer: Self-pay | Admitting: Cardiovascular Disease

## 2020-01-14 NOTE — Telephone Encounter (Signed)
*  STAT* If patient is at the pharmacy, call can be transferred to refill team.   1. Which medications need to be refilled? (please list name of each medication and dose if known) simvastatin (ZOCOR) 40 MG tablet  2. Which pharmacy/location (including street and city if local pharmacy) is medication to be sent to? [facility]  3. Do they need a 30 day or 90 day supply? 90 day supply

## 2020-01-16 ENCOUNTER — Other Ambulatory Visit: Payer: Self-pay | Admitting: Cardiovascular Disease

## 2020-01-17 MED ORDER — SIMVASTATIN 40 MG PO TABS: 40.0000 mg | ORAL_TABLET | Freq: Every day | ORAL | 2 refills | Status: DC

## 2020-01-17 NOTE — Telephone Encounter (Signed)
Rx(s) sent to pharmacy electronically.  

## 2020-01-21 ENCOUNTER — Other Ambulatory Visit: Payer: Self-pay | Admitting: Cardiovascular Disease

## 2020-01-21 MED ORDER — SIMVASTATIN 40 MG PO TABS: 40.0000 mg | ORAL_TABLET | Freq: Every day | ORAL | 1 refills | Status: DC

## 2020-01-21 NOTE — Telephone Encounter (Signed)
New Message    *STAT* If patient is at the pharmacy, call can be transferred to refill team.   1. Which medications need to be refilled? (please list name of each medication and dose if known) simvastatin (ZOCOR) 40 MG tablet  2. Which pharmacy/location (including street and city if local pharmacy) is medication to be sent to? [id number] - [place], [place] [address]  3. Do they need a 30 day or 90 day supply? Emergency supply, Pt says she has 2 pills left and has not gotten her mail order yet     Please call pt when the script has been sent

## 2020-01-21 NOTE — Telephone Encounter (Signed)
Rx request sent to pharmacy.  Pt informed and verbalized thanks.

## 2020-01-30 ENCOUNTER — Ambulatory Visit (INDEPENDENT_AMBULATORY_CARE_PROVIDER_SITE_OTHER): Payer: HMO | Admitting: *Deleted

## 2020-01-30 ENCOUNTER — Other Ambulatory Visit: Payer: Self-pay

## 2020-01-30 DIAGNOSIS — Z5181 Encounter for therapeutic drug level monitoring: Secondary | ICD-10-CM | POA: Diagnosis not present

## 2020-01-30 DIAGNOSIS — I480 Paroxysmal atrial fibrillation: Secondary | ICD-10-CM

## 2020-01-30 DIAGNOSIS — Z7901 Long term (current) use of anticoagulants: Secondary | ICD-10-CM | POA: Diagnosis not present

## 2020-01-30 LAB — POCT INR: INR: 2.5 (ref 2.0–3.0)

## 2020-01-30 NOTE — Patient Instructions (Signed)
Description   Continue with 1/2 tablet daily except 1 tablet each Tuesday,and Thursday.  Repeat INR in 5 weeks

## 2020-01-31 ENCOUNTER — Ambulatory Visit: Payer: Self-pay

## 2020-02-01 ENCOUNTER — Other Ambulatory Visit: Payer: Self-pay | Admitting: Cardiovascular Disease

## 2020-02-01 ENCOUNTER — Other Ambulatory Visit: Payer: Self-pay

## 2020-02-01 NOTE — Patient Outreach (Signed)
  [place] [facility]) Care Management Chronic Special Needs Program    02/01/2020  Name: [name], DOB: 08/28/1951  MRN: [id number]   [name] is enrolled in a chronic special needs plan for Diabetes. Telephone call to client for assessment follow up. Unable to reach or leave message. Message states client's voice mailbox is not set up.   PLAN; RNCM will attempt 2nd telephone outreach to client within 2 weeks.   Quinn Plowman RN,BSN,CCM [facility] Care Management [phone]

## 2020-02-04 ENCOUNTER — Ambulatory Visit: Payer: HMO

## 2020-02-05 ENCOUNTER — Ambulatory Visit: Payer: Self-pay

## 2020-02-06 ENCOUNTER — Other Ambulatory Visit: Payer: Self-pay

## 2020-02-06 NOTE — Patient Outreach (Signed)
  [place] [facility]) Care Management Chronic Special Needs Program    02/06/2020  Name: ZETTA STONEMAN, DOB: 1951-08-08  MRN: [id number]   Ms. Caylee Vlachos is enrolled in a chronic special needs plan for Diabetes. Second telephone call to client for assessment follow up. Unable to reach client or leave voice message. Mailbox is not set up.   PLAN; RNCM will attempt 3rd telephone outreach to patient within 2 weeks.   Quinn Plowman RN,BSN,CCM [facility] Care Management [phone]

## 2020-02-07 ENCOUNTER — Other Ambulatory Visit: Payer: Self-pay

## 2020-02-07 ENCOUNTER — Ambulatory Visit: Payer: Self-pay

## 2020-02-07 NOTE — Patient Outreach (Signed)
[place] [facility]) Care Management Chronic Special Needs Program  02/07/2020  Name: [name] DOB: June 12, 1951  MRN: [id number]  [name] is enrolled in a chronic special needs plan for  Reviewed and updated care plan diabetes. Third telephone call to client for assessment follow up. Unable to reach client or leave voice mail due to voice mail box not being set up. Email sent to [employer] concierge to request assistance with engaging client. Individualized care plan updated based on available data.    Goals Addressed            This Visit's Progress   . "I need help with warfarin injections, hard to get to MD office and would like nurse to come to my home" (pt-stated)   On track    Please contact your assigned RN case manager [phone] if assistance is needed with transportation to doctor office and/ or nurse follow up at home.     . Client understands the importance of follow-up with providers by attending scheduled visits   On track    Most recent primary care provider visit 10/19/19, 05/31/20 Cardiology visit 10/24/19 Pulmonary  10/09/19 Continue to maintain and keep follow up visits with your providers    . Client will have ADL/IADL needs addressed in next 3 months   On track    Please contact your assigned RN case manager if assistance is still needed with eating, walking meal preparation, housekeeping, shopping and errands, transportation and money management    . Client will report no worsening of symptoms related to heart disease within the next 3 months   On track    Mailed Emmi educational articles "Heart Failure, Adult" Take your medication as prescribed.  Call your doctor if you have questions Keep your follow up visits with your doctor.     . Client will use Assistive Devices as needed and verbalize understanding of device use   On track    Continue to use your cane/ walker for ambulation.  For safety when using your cane/ walker in your home  make sure walking areas are clear of clutter and removable rugs and areas are well lit.      . Client will verbalize knowledge of self management of Hypertension as evidences by BP reading of 140/90 or less; or as defined by provider   On track    Most recent blood pressure reading at cardiology appointment on 10/24/19 was 150/ 80 Continue to take your medication as prescribed.  RN case manager will send client education article: About high blood pressure Call your doctor if you have questions.      Marland Kitchen HEMOGLOBIN A1C < 7.0       Most recent Hgb A1C= 8.7% Review quarterly mailings on diabetes self management education Diabetes self management actions:  Glucose monitoring per provider recommendation  Check feet daily  Visit provider every 3-6 months as directed  Hbg A1C level every 3-6 months.  Eye Exam yearly  Carbohydrate controlled meal planning  Taking diabetes medication as prescribed by provider  Physical activity     . Maintain timely refills of diabetic medication as prescribed within the year .   On track    Take medications as prescribed.  Contact your assigned RN case manager if you have difficulty obtaining your medications Review of medical record indicates client maintains timely refills of diabetic medications     . Obtain annual  Lipid Profile, LDL-C   On track    lipid  profile was completed on 10/12/19, The goal for LDL is less than 70 mg/ dl as you are at high risk for complications try to avoid saturated fats, trans-fats, and eat more fiber. RN case manager will send client education article: Heart healthy diet.     . Obtain Annual Eye (retinal)  Exam    On track     Continue to schedule your eye exam yearlyM Most recent eye exam 09/04/19 Also, per K[employer] ([employer] -point of care tool) Client also saw her ophthalmologist on 10/31/18 and a retinal specialist on 03/12/19    . Obtain Annual Foot Exam   On track    diabetic foot exam was completed  06/01/19 It is important that your doctor check your feet at least 1 time per year.  Diabetes foot care - Check feet daily at home (look for skin color changes, cuts, sores or cracks in the skin, swelling of feet or ankles, ingrown or fungal toenails, corn or calluses). Report these findings to your doctor - Wash feet with soap and water, dry feet well especially between toes - Moisturize your feet but not between the toes - Always wear shoes that protect your whole feet.      . Obtain annual screen for micro albuminuria (urine) , nephropathy (kidney problems)   On track    Microalbumin/creatinine ratio was completed on 05/29/19   Attend yearly physicals and follow up visits with your providers and complete labs as recommended.     Illa Level Hemoglobin A1C at least 2 times per year   On track    Hgb A1c completed 08/18/20 and 05/31/20 Continue to keep your follow up appointments with your provider and have lab work completed as recommended.     . Visit Primary Care Provider or Endocrinologist at least 2 times per year    On track    Per chart review, client completed visits with primary care provider on 12/04/18 and 06/01/19 Continue to see your primary care provider as recommended.        Plan: RNCM will send client and primary care provider updated individualized care plan.  Education articles sent to client  Chronic care management coordinator will outreach in:  3 Months     Quinn Plowman RN,BSN,CCM [place] Management [phone]    .

## 2020-02-12 ENCOUNTER — Ambulatory Visit: Payer: Self-pay

## 2020-03-05 ENCOUNTER — Other Ambulatory Visit: Payer: Self-pay | Admitting: Pharmacist Clinician (PhC)/ Clinical Pharmacy Specialist

## 2020-03-12 DIAGNOSIS — E782 Mixed hyperlipidemia: Secondary | ICD-10-CM | POA: Diagnosis not present

## 2020-03-12 DIAGNOSIS — E10319 Type 1 diabetes mellitus with unspecified diabetic retinopathy without macular edema: Secondary | ICD-10-CM | POA: Diagnosis not present

## 2020-03-12 DIAGNOSIS — E1065 Type 1 diabetes mellitus with hyperglycemia: Secondary | ICD-10-CM | POA: Diagnosis not present

## 2020-03-14 ENCOUNTER — Other Ambulatory Visit: Payer: Self-pay

## 2020-03-14 ENCOUNTER — Ambulatory Visit (INDEPENDENT_AMBULATORY_CARE_PROVIDER_SITE_OTHER): Payer: HMO | Admitting: Pharmacist

## 2020-03-14 DIAGNOSIS — Z7901 Long term (current) use of anticoagulants: Secondary | ICD-10-CM

## 2020-03-14 DIAGNOSIS — I480 Paroxysmal atrial fibrillation: Secondary | ICD-10-CM

## 2020-03-14 LAB — POCT INR: INR: 2.6 (ref 2.0–3.0)

## 2020-03-19 DIAGNOSIS — I2510 Atherosclerotic heart disease of native coronary artery without angina pectoris: Secondary | ICD-10-CM | POA: Diagnosis not present

## 2020-03-19 DIAGNOSIS — E1065 Type 1 diabetes mellitus with hyperglycemia: Secondary | ICD-10-CM | POA: Diagnosis not present

## 2020-03-19 DIAGNOSIS — E782 Mixed hyperlipidemia: Secondary | ICD-10-CM | POA: Diagnosis not present

## 2020-03-19 DIAGNOSIS — I130 Hypertensive heart and chronic kidney disease with heart failure and stage 1 through stage 4 chronic kidney disease, or unspecified chronic kidney disease: Secondary | ICD-10-CM | POA: Diagnosis not present

## 2020-03-19 DIAGNOSIS — I700 Atherosclerosis of aorta: Secondary | ICD-10-CM | POA: Diagnosis not present

## 2020-03-19 DIAGNOSIS — I129 Hypertensive chronic kidney disease with stage 1 through stage 4 chronic kidney disease, or unspecified chronic kidney disease: Secondary | ICD-10-CM | POA: Diagnosis not present

## 2020-03-19 DIAGNOSIS — E10319 Type 1 diabetes mellitus with unspecified diabetic retinopathy without macular edema: Secondary | ICD-10-CM | POA: Diagnosis not present

## 2020-03-19 DIAGNOSIS — I5032 Chronic diastolic (congestive) heart failure: Secondary | ICD-10-CM | POA: Diagnosis not present

## 2020-03-19 DIAGNOSIS — E1059 Type 1 diabetes mellitus with other circulatory complications: Secondary | ICD-10-CM | POA: Diagnosis not present

## 2020-03-19 DIAGNOSIS — I482 Chronic atrial fibrillation, unspecified: Secondary | ICD-10-CM | POA: Diagnosis not present

## 2020-03-27 ENCOUNTER — Other Ambulatory Visit: Payer: Self-pay | Admitting: Internal Medicine

## 2020-03-27 DIAGNOSIS — Z1231 Encounter for screening mammogram for malignant neoplasm of breast: Secondary | ICD-10-CM

## 2020-03-28 ENCOUNTER — Encounter: Payer: Self-pay | Admitting: Cardiovascular Disease

## 2020-03-28 ENCOUNTER — Ambulatory Visit: Payer: HMO | Admitting: Cardiovascular Disease

## 2020-03-28 ENCOUNTER — Other Ambulatory Visit: Payer: Self-pay

## 2020-03-28 VITALS — BP 161/85 | HR 115 | Ht 63.0 in | Wt 290.0 lb

## 2020-03-28 DIAGNOSIS — I480 Paroxysmal atrial fibrillation: Secondary | ICD-10-CM | POA: Diagnosis not present

## 2020-03-28 DIAGNOSIS — E7800 Pure hypercholesterolemia, unspecified: Secondary | ICD-10-CM | POA: Diagnosis not present

## 2020-03-28 DIAGNOSIS — Z7901 Long term (current) use of anticoagulants: Secondary | ICD-10-CM | POA: Diagnosis not present

## 2020-03-28 DIAGNOSIS — I10 Essential (primary) hypertension: Secondary | ICD-10-CM | POA: Diagnosis not present

## 2020-03-28 DIAGNOSIS — N1831 Chronic kidney disease, stage 3a: Secondary | ICD-10-CM

## 2020-03-28 DIAGNOSIS — I5032 Chronic diastolic (congestive) heart failure: Secondary | ICD-10-CM

## 2020-03-28 DIAGNOSIS — E669 Obesity, unspecified: Secondary | ICD-10-CM

## 2020-03-28 DIAGNOSIS — E1151 Type 2 diabetes mellitus with diabetic peripheral angiopathy without gangrene: Secondary | ICD-10-CM | POA: Diagnosis not present

## 2020-03-28 DIAGNOSIS — I739 Peripheral vascular disease, unspecified: Secondary | ICD-10-CM

## 2020-03-28 DIAGNOSIS — I2510 Atherosclerotic heart disease of native coronary artery without angina pectoris: Secondary | ICD-10-CM

## 2020-03-28 MED ORDER — METOPROLOL TARTRATE 50 MG PO TABS: 50.0000 mg | ORAL_TABLET | Freq: Two times a day (BID) | ORAL | Status: DC

## 2020-03-28 MED ORDER — SIMVASTATIN 40 MG PO TABS: 40.0000 mg | ORAL_TABLET | Freq: Every day | ORAL | 0 refills | Status: DC

## 2020-03-28 MED ORDER — METOPROLOL TARTRATE 50 MG PO TABS: 50.0000 mg | ORAL_TABLET | Freq: Two times a day (BID) | ORAL | 1 refills | Status: DC

## 2020-03-28 NOTE — Patient Instructions (Signed)
Medication Instructions:  METOPROLOL TARTRATE: Increase the Metoprolol to 50 mg in the morning and 25 mg in the evening. Prescription has been sent for Metoprolol 50 mg twice daily but for now please take 50 mg in the morning and half a tablet (25 mg) in the evening.   *If you need a refill on your cardiac medications before your next appointment, please call your pharmacy*   Lab Work: None ordered If you have labs (blood work) drawn today and your tests are completely normal, you will receive your results only by: Marland Kitchen MyChart Message (if you have MyChart) OR . A paper copy in the mail If you have any lab test that is abnormal or we need to change your treatment, we will call you to review the results.   Testing/Procedures: None ordered   Follow-Up: At [facility], you and your health needs are our priority.  As part of our continuing mission to provide you with exceptional heart care, we have created designated Provider Care Teams.  These Care Teams include your primary Cardiologist (physician) and Advanced Practice Providers (APPs -  Physician Assistants and Nurse Practitioners) who all work together to provide you with the care you need, when you need it.  We recommend signing up for the patient portal called "MyChart".  Sign up information is provided on this After Visit Summary.  MyChart is used to connect with patients for Virtual Visits (Telemedicine).  Patients are able to view lab/test results, encounter notes, upcoming appointments, etc.  Non-urgent messages can be sent to your provider as well.   To learn more about what you can do with MyChart, go to [email or web address].    Your next appointment:   Follow up with an APP in 6 weeks Follow up with Dr. Sallyanne Kuster in 6 months

## 2020-03-28 NOTE — Progress Notes (Signed)
Patient ID: [name], female   DOB: [birth year], 69 y.o.   MRN: [id number]  Patient ID: [name], female   DOB: December 31, 1950, 69 y.o.   MRN: [id number]    Cardiology Office Note    Date:  03/28/2020   ID:  [name], DOB December 22, 1950, MRN [id number]  PCP:  [name], MD  Cardiologist:   Sanda Klein, MD   No chief complaint on file.   History of Present Illness:  [name] is a 69 y.o. female with CAD s/p CABG and PAD, chronic diastolic CHF and paroxysmal atrial fibrillation.  She is now roughly 2-1/2 years following single-vessel LIMA to LAD bypass for recurrent in-stent restenosis multiple PCI.  She has a history of recurrent paroxysmal atrial fibrillation, diabetes mellitus requiring insulin, morbid obesity.  After increasing diuretic dose she has lost the 10 pounds that she has gained and has less problems with dyspnea and edema.  She always has a little swelling in the left ankle where she has had multiple surgeries.  Back to baseline weight of 290 pounds.  Was a little tachycardic when she came in today, after getting up to weigh on the scales but heart rate normalized after a few minutes of sitting down.  She continues to have a lot of difficulty ambulating due to her knee problems and was in a wheelchair today.  ECG is a little hard to read but I believe she has atrial flutter with variable AV block.  She denies angina, orthopnea, PND, dizziness or syncope or palpitations.  She has not had any falls.  Most recent labs show hemoglobin A1c of 8.7% in January, but favorable lipid profile in June (total cholesterol 121, HDL 40, triglycerides 102)   In 2013 she received a drug-eluting stent to the LAD artery. In July 2015 she had high-grade in-stent restenosis treated with balloon angioplasty and placement of a new 312 mm drug-eluting Xience expedition stent to the distal edge of the previous stent. Incidental note made of a 50-60% mid AV groove left  circumflex stenosis. In April 2017 she presented with chest pain and a nuclear study showed reversible ischemia after which she received another drug-eluting stent covering the 2 previous stents in the proximal LAD. In November 2018 she returned again with in-stent restenosis and was referred for single-vessel LIMA to LAD bypass surgery.She has normal left ventricular systolic function and no congestive heart failure.  In addition she has history of PAD status post atherectomy of the left superficial femoral artery and paroxysmal atrial fibrillation (on chronic warfarin anticoagulation). Additional comorbid conditions include hypertension, obesity, type 2 diabetes mellitus and gastroesophageal reflux disease. She has a history of left upper lobe lobectomy for adenocarcinoma of the lung with curative intent February 2010. She has a history of inflammatory esophagitis (noneosinophilic), stricture (which was dilated) and cervical web. (Dr. Benson Norway, EGD 2016 and re-dilated July 2019).   Past Medical History:  Diagnosis Date  . Arthritis    "knees" (02/05/2016)  . Cancer ([place]) 5/10   [facility] V[facility]   . Coronary artery disease   . GERD (gastroesophageal reflux disease)    tx meds  . H/O hiatal hernia   . History of blood transfusion "several"   last april 2015 s/p knee surgery (02/05/2016)  . Hypercholesteremia   . Hypertension   . Lung cancer ([facility])    2010, surgery 18% left lung no radiation or chemo  . Non-STEMI (non-ST elevated myocardial infarction) ([place]) 04/07/2012   See cath  results below  . Paroxysmal atrial fibrillation ([place]) 04/07/2012   On Warfarin  . Peripheral vascular disease ([employer] [place]) 8/12   Lt SFA PTA  . Presence of stent in LAD coronary artery 04/07/2012   Xience Expedition DES 2.75 mm x 18 mm (dilated to 3.0 mm)  . Sleep apnea    does not wear CPAP  . Type II diabetes mellitus ([facility])    insulin dependent    Past Surgical History:  Procedure Laterality Date  . ABDOMINAL HYSTERECTOMY  1990   . BREAST EXCISIONAL BIOPSY Right   . CARDIAC CATHETERIZATION  11/04/2008   patent RCA, LM, and Circ, nl EF  . CARDIAC CATHETERIZATION  02/20/2002   patent coronaries with the only abnormality being a smooth luminla irregularity in the mid intermediate ramus branch no felt to be hemodynamically significant, nl LV  . CARDIAC CATHETERIZATION N/A 02/05/2016   Procedure: Left Heart Cath and Coronary Angiography;  Surgeon: Leonie Man, MD;  Location: [facility];  Service: Cardiovascular;  Laterality: N/A;  . CARDIAC CATHETERIZATION N/A 02/05/2016   Procedure: Coronary Stent Intervention;  Surgeon: Leonie Man, MD;  Location: [facility];  Service: Cardiovascular;  Laterality: N/A;  . CATARACT EXTRACTION W/ INTRAOCULAR LENS  IMPLANT, BILATERAL  2013  . CORONARY ANGIOPLASTY WITH STENT PLACEMENT  04/07/2012   Xience Expedition DES 2.26mm x 18 mm (dilated to 3.0 mm) to the prox LAD   . CORONARY ANGIOPLASTY WITH STENT PLACEMENT  2013; 2015  . CORONARY ARTERY BYPASS GRAFT N/A 08/17/2017   Procedure: CORONARY ARTERY BYPASS GRAFTING (CABG) TIMES 1 USING LEFT INTERNAL MAMMARY ARTERY;  Surgeon: Melrose Nakayama, MD;  Location: [facility];  Service: Open Heart Surgery;  Laterality: N/A;  . DILATION AND CURETTAGE OF UTERUS  <1990  . ESOPHAGOGASTRODUODENOSCOPY N/A 05/23/2015   Procedure: ESOPHAGOGASTRODUODENOSCOPY (EGD);  Surgeon: Carol Ada, MD;  Location: [facility];  Service: Endoscopy;  Laterality: N/A;  . ESOPHAGOGASTRODUODENOSCOPY (EGD) WITH PROPOFOL N/A 06/13/2015   Procedure: ESOPHAGOGASTRODUODENOSCOPY (EGD) WITH PROPOFOL;  Surgeon: Carol Ada, MD;  Location: [facility];  Service: Endoscopy;  Laterality: N/A;  . ESOPHAGOGASTRODUODENOSCOPY (EGD) WITH PROPOFOL N/A 04/07/2018   Procedure: ESOPHAGOGASTRODUODENOSCOPY (EGD) WITH PROPOFOL;  Surgeon: Carol Ada, MD;  [facility];  Service: Endoscopy;  Laterality: N/A;  fluoroscopy is needed  . FRACTURE SURGERY    . HAMMER  TOE SURGERY Bilateral 1993   with screws, on screw removed  . JOINT REPLACEMENT    . KNEE ARTHROSCOPY Bilateral    left x2, right x1  . LAPAROSCOPIC CHOLECYSTECTOMY    . LEFT HEART CATH AND CORONARY ANGIOGRAPHY N/A 08/11/2017   Procedure: LEFT HEART CATH AND CORONARY ANGIOGRAPHY;  Surgeon: Martinique, Peter M, MD;  Location: [facility];  Service: Cardiovascular;  Laterality: N/A;  . LEFT HEART CATHETERIZATION WITH CORONARY ANGIOGRAM N/A 04/07/2012   Procedure: LEFT HEART CATHETERIZATION WITH CORONARY ANGIOGRAM;  Surgeon: [name], MD;  Location: [facility];  Service: Cardiovascular;  Laterality: N/A;  . LEFT HEART CATHETERIZATION WITH CORONARY ANGIOGRAM N/A 05/27/2014   Procedure: LEFT HEART CATHETERIZATION WITH CORONARY ANGIOGRAM;  Surgeon: [name], MD; LAD 99% ISR, CFX 50-60%, RCA (dominant) no sig dz, EF 60%  . LOWER EXTREMITY ANGIOGRAM  05/17/11   directional atherectomy to the prox L SFA using a LX Man TurboHawk, ballooned with a [employer] balloon   . ORIF ANKLE FRACTURE  07/09/2012   Procedure: OPEN REDUCTION INTERNAL FIXATION (ORIF) ANKLE FRACTURE;  Surgeon: Meredith Pel,  MD;  [facility];  Service: Orthopedics;  Laterality: Left;  open reduction internal fixation trimalleolar ankle fracture medial malleolous fixation  . PERCUTANEOUS CORONARY STENT INTERVENTION (PCI-S) N/A 04/07/2012   Procedure: PERCUTANEOUS CORONARY STENT INTERVENTION (PCI-S);  Surgeon: [name], MD;  Location: [facility];  Service: Cardiovascular;  Laterality: N/A;  . PERCUTANEOUS CORONARY STENT INTERVENTION (PCI-S)  05/27/2014   Procedure: PERCUTANEOUS CORONARY STENT INTERVENTION (PCI-S);  Surgeon: [name], MD; 3 mm x 12 mm long Xience Xpedition DES to the proximal LAD  . SAVORY DILATION N/A 06/13/2015   Procedure: SAVORY DILATION;  Surgeon: Carol Ada, MD;  [facility];  Service: Endoscopy;  Laterality: N/A;  . SAVORY DILATION N/A 04/07/2018   Procedure:  SAVORY DILATION;  Surgeon: Carol Ada, MD;  [facility];  Service: Endoscopy;  Laterality: N/A;  . TEE WITHOUT CARDIOVERSION N/A 08/17/2017   Procedure: TRANSESOPHAGEAL ECHOCARDIOGRAM (TEE);  Surgeon: Melrose Nakayama, MD;  Location: [facility];  Service: Open Heart Surgery;  Laterality: N/A;  . TONSILLECTOMY    . TOTAL KNEE ARTHROPLASTY Left 2003  . TOTAL KNEE REVISION Left 11/05/2013   Procedure: LEFT TOTAL KNEE RESECTION;  Surgeon: Mauri Pole, MD;  [facility];  Service: Orthopedics;  Laterality: Left;  . TOTAL KNEE REVISION Left 2007   opened in 2006 and cleaned out, 2007 revision  . TOTAL KNEE REVISION Left 12/31/2013   Procedure: RE-INPLANTATION LEFT TOTAL KNEE ;  Surgeon: Mauri Pole, MD;  [facility];  Service: Orthopedics;  Laterality: Left;  Marland Kitchen VIDEO ASSISTED THORACOSCOPY (VATS)/ LOBECTOMY  01/2009    Current Medications: Outpatient Medications Prior to Visit  Medication Sig Dispense Refill  . furosemide (LASIX) 40 MG tablet TAKE 1 TABLET BY MOUTH EVERY DAY 90 tablet 2  . glimepiride (AMARYL) 2 MG tablet Take 4 mg by mouth daily with breakfast.     . insulin NPH-regular Human (NOVOLIN 70/30) (70-30) 100 UNIT/ML injection Inject 30-34 Units into the skin 2 (two) times daily with a meal. Prior to surgery, she took 42 units in the morning 20 units in the evening. As of 08/23/2017, she is on 20 units in am and 20 units in the pm. Her Insulin gradually needs to be increased to pre surgery doses as glucose levels allow. (Patient taking differently: Inject 20 Units into the skin 2 (two) times daily before a meal. 50 qam, 22 qpm) 10 mL 11  . lisinopril-hydrochlorothiazide (PRINZIDE,ZESTORETIC) 20-25 MG per tablet Take 1 tablet by mouth daily.    . metoCLOPramide (REGLAN) 10 MG tablet Take 10 mg by mouth 2 (two) times daily. Per patient    . metoprolol tartrate (LOPRESSOR) 25 MG tablet Take 1 tablet (25 mg total) by mouth 2 (two) times daily. Please keep upcoming appt  in January with Dr. Sallyanne Kuster before anymore refills. Thank you 180 tablet 0  . pantoprazole (PROTONIX) 40 MG tablet TAKE 1 TABLET BY MOUTH DAILY. 90 tablet 1  . simvastatin (ZOCOR) 40 MG tablet Take 1 tablet (40 mg total) by mouth daily at 6 PM. 30 tablet 1  . spironolactone (ALDACTONE) 25 MG tablet TAKE 1 TABLET BY MOUTH EVERY DAY 90 tablet 2  . Vitamin D, Cholecalciferol, 1000 units TABS Take 1,000 Units by mouth every morning.     . warfarin (COUMADIN) 7.5 MG tablet TAKE 1/2 TO 1 TABLET DAILY AS DIRECTED BY COUMADIN CLINIC 90 tablet 0   No facility-administered medications prior to visit.     Allergies:  Levofloxacin and Morphine and related   Social History   Socioeconomic History  . Marital status: Single    Spouse name: Not on file  . Number of children: Not on file  . Years of education: Not on file  . Highest education level: Not on file  Occupational History  . Occupation: Retired  Tobacco Use  . Smoking status: Former Smoker    Packs/day: 1.00    Years: 20.00    Pack years: 20.00    Types: Cigarettes    Quit date: 09/27/1984    Years since quitting: 35.5  . Smokeless tobacco: Never Used  Vaping Use  . Vaping Use: Never used  Substance and Sexual Activity  . Alcohol use: No  . Drug use: No  . Sexual activity: Not on file  Other Topics Concern  . Not on file  Social History Narrative   Patient lives alone.   Social Determinants of Health   Financial Resource Strain:   . Difficulty of Paying Living Expenses:   Food Insecurity:   . Worried About Charity fundraiser in the Last Year:   . Arboriculturist in the Last Year:   Transportation Needs: Unmet Transportation Needs  . Lack of Transportation (Medical): Yes  . Lack of Transportation (Non-Medical): Yes  Physical Activity:   . Days of Exercise per Week:   . Minutes of Exercise per Session:   Stress: Stress Concern Present  . Feeling of Stress : Very much  Social Connections:   . Frequency of  Communication with Friends and Family:   . Frequency of Social Gatherings with Friends and Family:   . Attends Religious Services:   . Active Member of Clubs or Organizations:   . Attends Archivist Meetings:   Marland Kitchen Marital Status:      Family History:  The patient's family history includes Coronary artery disease in her brother; Hypertension in her mother and sister.   ROS:   Please see the history of present illness.    All other systems are reviewed and are negative.  PHYSICAL EXAM:   VS:  BP (!) 161/85   Pulse (!) 115   Ht 5\' 3"  (1.6 m)   Wt 290 lb (131.5 kg)   SpO2 98%   BMI 51.37 kg/m      General: Alert, oriented x3, no distress, super obese Head: no evidence of trauma, PERRL, EOMI, no exophtalmos or lid lag, no myxedema, no xanthelasma; normal ears, nose and oropharynx Neck: cannot evaluate jugular venous pulsations and hepatojugular reflux; brisk carotid pulses without delay and no carotid bruits Chest: clear to auscultation, no signs of consolidation by percussion or palpation, normal fremitus, symmetrical and full respiratory excursions Cardiovascular: normal position and quality of the apical impulse, regular rhythm, normal first and widely split second heart sounds, no murmurs, rubs or gallops Abdomen: no tenderness or distention, no masses by palpation, no abnormal pulsatility or arterial bruits, normal bowel sounds, no hepatosplenomegaly Extremities: no clubbing, cyanosis or edema R foot; 1-2 edema L ankle, scars of left ankle and L knee surgery; 2+ radial, ulnar and brachial pulses bilaterally; 2+ right femoral, posterior tibial and dorsalis pedis pulses; 2+ left femoral, posterior tibial and dorsalis pedis pulses; no subclavian or femoral bruits Neurological: grossly nonfocal Psych: Normal mood and affect    Wt Readings from Last 3 Encounters:  03/28/20 290 lb (131.5 kg)  10/24/19 (!) 300 lb 6.4 oz (136.3 kg)  10/09/19 290 lb (131.5 kg)  Studies/Labs Reviewed:   EKG:  EKG is ordered today.  It probably represents atrial flutter with variable AV block, old RBBB (QRS 162 ms, QTC appropriately prolonged 492 ms), no ischemic changes.  Recent Labs: 10/19/2016 Potassium 3.9, creatinine 1.0, normal LFTs, normal TSH, hemoglobin 10.6 (02/06/2016 BMET    Component Value Date/Time   NA 137 11/16/2019 1552   NA 137 09/15/2015 0959   K 4.2 11/16/2019 1552   K 4.0 09/15/2015 0959   CL 97 11/16/2019 1552   CL 99 09/12/2012 0959   CO2 21 11/16/2019 1552   CO2 27 09/15/2015 0959   GLUCOSE 204 (H) 11/16/2019 1552   GLUCOSE 240 (H) 09/23/2017 2016   GLUCOSE 206 (H) 09/15/2015 0959   GLUCOSE 217 (H) 09/12/2012 0959   BUN 12 11/16/2019 1552   BUN 16.1 09/15/2015 0959   CREATININE 1.21 (H) 11/16/2019 1552   CREATININE 1.03 (H) 01/30/2016 1058   CREATININE 1.2 (H) 09/15/2015 0959   CALCIUM 9.9 11/16/2019 1552   CALCIUM 9.9 09/15/2015 0959   GFRNONAA 45 (L) 11/16/2019 1552   GFRAA 52 (L) 11/16/2019 1552    Lipid Panel    Component Value Date/Time   CHOL 133 08/10/2017 1141   TRIG 78 08/10/2017 1141   HDL 46 08/10/2017 1141   CHOLHDL 2.9 08/10/2017 1141   VLDL 16 08/10/2017 1141   LDLCALC 71 08/10/2017 1141   LDLDIRECT 143 (H) 10/10/2008 2042   10/19/2016 LDL 64, HDL 49, total cholesterol 130, triglycerides 83   ASSESSMENT:    No diagnosis found.   PLAN:  In order of problems listed above:  1. CHF: Improved volume status after increased dose of diuretics.  Back to target weight 290 pounds or less.  Preserved LV systolic function. 2. PAF: Current rhythm appears to be some type of atypical atrial flutter with variable block.  It sounds like rate control is poor during physical activity.  We will increase the morning dose of beta-blocker to 50 mg.  This diagnosis precedes the bypass procedure.  CHADSVasc 6 (age, gender, DM, CHF, CAD/PAD, HTN). 3. Anticoagulation: On warfarin monitored by her primary care provider.   Aspirin has been stopped to reduce bleeding risk. 4. CAD s/p CABG: Had single-vessel LIMA bypass to the LAD due to recurrent in-stent restenosis.  Off aspirin.  On statin.  Angina free. 5. HTN: Blood pressure still little high.  Increasing the dose of beta-blocker. 6. DM: Labs monitored by Dr. Ashby Dawes.  Would suggest switching to an SGLT2 inhibitor such as Jardiance or Wilder Glade if he agrees. 7. HLP: On statin with acceptable lipid parameters. 8. Obesity: Super obese, this is associated with many of her other comorbid conditions and significantly worsens her prognosis. 9. PAD: remote PTA L SFA 2012. No claudication, but this could be masked by the fact that she is so sedentary. 10. CKD 3: Stable, at baseline GFR around 50.   Medication Adjustments/Labs and Tests Ordered: Current medicines are reviewed at length with the patient today.  Concerns regarding medicines are outlined above.  Medication changes, Labs and Tests ordered today are listed in the Patient Instructions below. There are no Patient Instructions on file for this visit.   Signed, Sanda Klein, MD  03/28/2020 11:42 AM    [facility] [address], [place], [place]  [address] Phone: [phone]; Fax: [phone]

## 2020-03-30 ENCOUNTER — Other Ambulatory Visit: Payer: Self-pay | Admitting: Cardiovascular Disease

## 2020-03-31 ENCOUNTER — Encounter: Payer: Self-pay | Admitting: Cardiovascular Disease

## 2020-04-01 ENCOUNTER — Telehealth: Payer: Self-pay | Admitting: Cardiovascular Disease

## 2020-04-01 ENCOUNTER — Other Ambulatory Visit: Payer: Self-pay | Admitting: Cardiovascular Disease

## 2020-04-01 MED ORDER — SIMVASTATIN 40 MG PO TABS: 40.0000 mg | ORAL_TABLET | Freq: Every day | ORAL | 3 refills | Status: DC

## 2020-04-01 NOTE — Telephone Encounter (Signed)
[facility] has been made aware to fill the Metoprolol Tartrate 50 mg twice daily.

## 2020-04-01 NOTE — Telephone Encounter (Signed)
New Message  Pt c/o medication issue:  1. Name of Medication: metoprolol tartrate (LOPRESSOR) 50 MG tablet  2. How are you currently taking this medication (dosage and times per day)? 50 mg twice daily  3. Are you having a reaction (difficulty breathing--STAT)? no  4. What is your medication issue? Idelle Jo from [facility] is calling in to confirm dosage amount for patient. Please give a call back to confirm.

## 2020-04-01 NOTE — Telephone Encounter (Signed)
New Message   *STAT* If patient is at the pharmacy, call can be transferred to refill team.   1. Which medications need to be refilled? (please list name of each medication and dose if known) simvastatin (ZOCOR) 40 MG tablet  2. Which pharmacy/location (including street and city if local pharmacy) is medication to be sent to? [facility] - [place], [place]  3. Do they need a 30 day or 90 day supply? 90 day

## 2020-04-02 NOTE — Telephone Encounter (Signed)
Ardelle Lesches, RN 21 hours ago (1:34 PM)     [facility] has been made aware to fill the Metoprolol Tartrate 50 mg twice daily.      Pierre called back to confirm Metoprolol dosage. I have confirmed patient should be on Metoprolol Tartrate 50 mg twice daily

## 2020-04-02 NOTE — Telephone Encounter (Signed)
Follow Up  [name] is calling in from [facility] to confirm medication dosage again. Please give a call back .

## 2020-04-21 ENCOUNTER — Other Ambulatory Visit: Payer: Self-pay

## 2020-05-01 ENCOUNTER — Ambulatory Visit: Payer: Self-pay

## 2020-05-07 ENCOUNTER — Other Ambulatory Visit: Payer: Self-pay

## 2020-05-07 NOTE — Patient Outreach (Signed)
[place] [facility]) Care Management Chronic Special Needs Program  05/07/2020  Name: WINSLOW VERRILL DOB: May 16, 1951  MRN: [id number]  Ms. Devanee Pomplun is enrolled in a chronic special needs plan for Diabetes.  Telephone call to client for CSNP assessment follow up. HIPAA verified. Client reports she is doing good.  She states she has a follow up visit with her cardiologist on 05/09/20 and recently saw her primary care provider who adjusted her insulin. Client states her blood sugars have ranged from 100's to 300's. She denies any low blood sugars. Client reports she has seen some improvement in her blood sugars since her doctor adjusted her insulin.  Client states she takes her medications as prescribed and is able to afford them at this time. Client states her brother takes her to her appointments. She reports having assistance with her ADL's/ IADL's.      Goals Addressed              This Visit's Progress   .  COMPLETED: "I need help with warfarin injections, hard to get to MD office and would like nurse to come to my home" (pt-stated)   On track     Client reports she is now taking oral warfarin and her brother takes her to her doctors appointments. She denies any further needs.     .  COMPLETED: Client understands the importance of follow-up with providers by attending scheduled visits   On track     Most recent primary care provider visit 10/19/19, and 03/19/20 Cardiology visit 04/04/20 Pulmonary  10/09/19 Continue to maintain and keep follow up visits with your providers    .  Client verbalize knowledge of Heart Failure disease self management skills in 6 months.   On track     RN case manager will send client education article: Low salt diet and Heart failure ED.  Review [employer] calendar sent in the mail for heart failure action plan It is important to weigh daily and write down your weights. Pay attention to your body if you have any shortness of breath,  swelling in your feet, ankles, legs or your waistband gets tight. Report these symptoms to your doctor as soon as possible.  Follow a low salt meal plan.     .  COMPLETED: Client will have ADL/IADL needs addressed in next 3 months   On track     Client reports she has assistance with her daily care needs.     .  Client will report no worsening of symptoms related to heart disease within the next 3 months   On track     Mailed Emmi educational articles: Heart disease in Diabetics . Notify provider for symptoms of chest pain, sweating, nausea/vomiting, irregular heartbeat, palpitations, rapid heart rate, shortness of breath or dizziness or fainting. . Call 911 for severe symptoms of chest pain or shortness of breath. Client reported no signs or symptoms of cardiac issues.  Client reports her next follow up visit with her cardiologist is 05/09/20      .  Client will use Assistive Devices as needed and verbalize understanding of device use   On track     Client reports using a rollator device for ambulation . Make sure there is good lighting throughout your home . Make sure walkways are clear of clutter, cords and throw rugs . Make sure furniture is secure, sturdy and does not swivel . Grab bars are suggested near the toilet, tub and shower .  Turn on the lights when you go into a dark area.  Use night- lights . Keep items that you use often in easy to reach places.     .  Client will verbalize knowledge of self management of Hypertension as evidences by BP reading of 140/90 or less; or as defined by provider   On track     Most recent blood pressure reading at cardiology appointment on 03/28/20 was 161/85 Continue to check your blood pressure regularly and report elevated blood pressures to your doctor.  Contact to take your blood pressure medication as prescribed.  Plan to eat low salt and heart healthy meals with  fruits, vegetables, whole grains, lean protein and limit fat and sugars.  RN  case manager will send client education articles: Low Salt diet, Heart healthy diet, and High blood pressure in adults.       .  client will verbalize management of hypoglycemic blood sugars in 6 months.        Regular exercise is often an effective way to control your blood sugar.  Discuss this with your doctor at your next appointment to make sure this is safe for you.  Take your medication as directed.  Follow your diabetes eating plan Check your blood sugars regularly or as recommended by your doctor. Minette Brine your doctor if you: (having ongoing difficulty maintaining normal blood sugar levels, develop blurred vision, urinate frequently, feel excessive thirst, Get frequent headaches. )     .  HEMOGLOBIN A1C < 7        Most recent Hgb A1C= 8.7% Continue to review quarterly mailings on diabetes self management education Re-discussed Diabetes self management actions:  Glucose monitoring per provider recommendation  Check feet daily  Visit provider every 3-6 months as directed  Hbg A1C level every 3-6 months.  Eye Exam yearly  Carbohydrate controlled meal planning  Taking diabetes medication as prescribed by provider  Physical activity     .  Maintain timely refills of diabetic medication as prescribed within the year .   On track     Client reports she maintains timely refills of her medications and she is able to afford her medications at this time.  Continue to take your medicines as prescribed  Contact your doctor if you have questions.  Contact your RN case manager if you are not able to obtain your medications.     .  COMPLETED: Obtain annual  Lipid Profile, LDL-C   On track     lipid profile was completed on 03/12/20     .  Obtain Annual Eye (retinal)  Exam    On track     Most recent eye exam 09/04/19 Diabetes can affect your vision. Plan to have a dilated eye exam every year.     .  Obtain Annual Foot Exam   On track     diabetic foot exam was completed  06/01/19 . Check your feet and in-between toes daily for cuts, bruises, redness, blisters or sores. If you can't reach them, use a mirror.  . Wash feet with soap and water, dry feet well especially between toes. Don't use too much lotion.  Wear shoes that are not too tight and don't walk barefoot    .  Obtain annual screen for micro albuminuria (urine) , nephropathy (kidney problems)   On track     Microalbumin/creatinine ratio was completed on 05/29/19  Continue to attend yearly physicals and follow up visits with your providers and  complete labs as recommended.     Illa Level Hemoglobin A1C at least 2 times per year   On track     Hgb A1c completed 08/19/19 and 10/12/19 Continue to keep your follow up appointments with your provider and have lab work completed as recommended.     .  Visit Primary Care Provider or Endocrinologist at least 2 times per year    On track     Most recent primary care provider visit 10/19/19, and 03/19/20 Client reports next follow up visit with her primary care provider is October 2021 Continue to maintain and keep follow up visits with your provider      Assessment:  Client verbalizes management of her medication and affordability. She reports having her transportation needs and daily care needs met. Client denies being overwhelmed with her health care at this time. Client tier level changed to 2.   Plan:  Send successful outreach letter with a copy of their individualized care plan, Send individual care plan to provider and Send educational material  Chronic care management coordinator will outreach in:  6 Months   Quinn Plowman RN,BSN,CCM [place] [facility] [phone]    .

## 2020-05-09 ENCOUNTER — Ambulatory Visit (INDEPENDENT_AMBULATORY_CARE_PROVIDER_SITE_OTHER): Payer: HMO

## 2020-05-09 ENCOUNTER — Telehealth: Payer: Self-pay | Admitting: Cardiovascular Disease

## 2020-05-09 ENCOUNTER — Ambulatory Visit: Payer: HMO | Admitting: Adult Health

## 2020-05-09 ENCOUNTER — Other Ambulatory Visit: Payer: Self-pay

## 2020-05-09 DIAGNOSIS — Z7901 Long term (current) use of anticoagulants: Secondary | ICD-10-CM

## 2020-05-09 DIAGNOSIS — Z5181 Encounter for therapeutic drug level monitoring: Secondary | ICD-10-CM | POA: Diagnosis not present

## 2020-05-09 DIAGNOSIS — I480 Paroxysmal atrial fibrillation: Secondary | ICD-10-CM | POA: Diagnosis not present

## 2020-05-09 LAB — POCT INR: INR: 2.4 (ref 2.0–3.0)

## 2020-05-09 NOTE — Patient Instructions (Signed)
Continue with 1/2 tablet daily except 1 tablet each Tuesday,and Thursday.  Repeat INR in 6 weeks

## 2020-05-09 NOTE — Telephone Encounter (Signed)
Error

## 2020-05-13 ENCOUNTER — Ambulatory Visit
Admission: RE | Admit: 2020-05-13 | Discharge: 2020-05-13 | Disposition: A | Discharge status: Home / Self Care | Payer: HMO | Source: Ambulatory Visit | Attending: Internal Medicine | Admitting: Internal Medicine

## 2020-05-13 ENCOUNTER — Other Ambulatory Visit: Payer: Self-pay

## 2020-05-13 DIAGNOSIS — Z1231 Encounter for screening mammogram for malignant neoplasm of breast: Secondary | ICD-10-CM | POA: Diagnosis not present

## 2020-05-20 NOTE — Progress Notes (Signed)
Virtual Visit via Telephone Note   This visit type was conducted due to national recommendations for restrictions regarding the COVID-19 Pandemic (e.g. social distancing) in an effort to limit this patient's exposure and mitigate transmission in our community.  Due to her co-morbid illnesses, this patient is at least at moderate risk for complications without adequate follow up.  This format is felt to be most appropriate for this patient at this time.  The patient did not have access to video technology/had technical difficulties with video requiring transitioning to audio format only (telephone).  All issues noted in this document were discussed and addressed.  No physical exam could be performed with this format.  Please refer to the patient's chart for her  consent to telehealth for [facility]  Evaluation Performed:  Follow-up visit  This visit type was conducted due to national recommendations for restrictions regarding the COVID-19 Pandemic (e.g. social distancing).  This format is felt to be most appropriate for this patient at this time.  All issues noted in this document were discussed and addressed.  No physical exam was performed (except for noted visual exam findings with Video Visits).  Please refer to the patient's chart (MyChart message for video visits and phone note for telephone visits) for the patient's consent to telehealth for [facility]  Date:  05/21/2020   ID:  [name], DOB 08/16/1951, MRN [id number]  Patient Location:  [address] [place] [address]   Provider location:     [facility] [address] Office [phone] Fax [phone]   PCP:  Georgianne Fick, MD  Cardiologist:  Thurmon Fair, MD  Electrophysiologist:  None   Chief Complaint: Follow-up CHF/CAD  History of Present Illness:    [name] is a 69 y.o. female who presents via audio/video conferencing for a  telehealth visit today.  Patient verified DOB and address.  She has a PMH of CABG (LIMA-LAD 11/18), PAD, chronic diastolic CHF, and paroxysmal atrial fibrillation.  Her PMH also includes type 2 diabetes on insulin, and morbid obesity.  She underwent CABG November 2018 for recurrent in-stent restenosis with multiple PCI.  (PCI with DES to LAD 2013, 2015 DES to distal LAD, 4/17 received DES in proximal LAD, 11/18 again with 3 stenosis and was referred for single-vessel CABG).  She is also status post left superficial femoral artery arthrectomy.  She was last seen by Dr. Royann Shivers on 03/28/2020.  During that time she had diuresed around 10 pounds and was having less dyspnea and edema.  She is back to her baseline weight of 290 pounds.  She was slightly tachycardic after getting up to weigh and ambulating to the exam room.  Her heart rate returned to normal after resting for a few minutes.  She complained of difficulty ambulating due to knee problems and used a wheelchair.  EKG showed atrial flutter with variable AV block.  She denied angina, orthopnea, PND, presyncope/syncope and denied falls.  She is seen virtually today in follow-up and states she feels well.  She states she does not regularly check her blood pressure.  She has been taking all her medications as prescribed.  She has been somewhat limited in her physical activity due to her knee pain.  She does have some left ankle swelling but she states this not any worse than it has been.  She does try to do some activity in her wheelchair including leg extensions and pointing her  toes.  I did recommend that she incorporate upper body exercises such as arm rotations, bicep curls, and arm raises.  She has been maintaining a low-salt diet and her weight has been stable.  Her blood pressure slightly elevated today at 145/82.  I will have her keep blood pressure log and follow-up in 1 month for further evaluation.  I will give her the salty 6 diet sheet.  Today  she denies chest pain, shortness of breath, lower extremity edema, fatigue, palpitations, melena, hematuria, hemoptysis, diaphoresis, weakness, presyncope, syncope, orthopnea, and PND.  The patient does not symptoms concerning for COVID-19 infection (fever, chills, cough, or new SHORTNESS OF BREATH).    Prior CV studies:   The following studies were reviewed today:  EKG 03/28/20 Atrial flutter with 2 1 AV block 84 bpm  Echocardiogram 08/15/2017 Study Conclusions   - Left ventricle: The cavity size was normal. Systolic function was  normal. The estimated ejection fraction was in the range of 55%  to 60%. Wall motion was normal; there were no regional wall  motion abnormalities. Features are consistent with a pseudonormal  left ventricular filling pattern, with concomitant abnormal  relaxation and increased filling pressure (grade 2 diastolic  dysfunction).  - Left atrium: The atrium was mildly dilated.   Past Medical History:  Diagnosis Date   Arthritis    "knees" (02/05/2016)   Cancer (HCC) 5/10   [facility] Vats    CHF (congestive heart failure) (HCC)    Coronary artery disease    GERD (gastroesophageal reflux disease)    tx meds   H/O hiatal hernia    History of blood transfusion "several"   last april 2015 s/p knee surgery (02/05/2016)   Hypercholesteremia    Hypertension    Lung cancer (HCC)    2010, surgery 18% left lung no radiation or chemo   Non-STEMI (non-ST elevated myocardial infarction) (HCC) 04/07/2012   See cath results below   Paroxysmal atrial fibrillation (HCC) 04/07/2012   On Warfarin   Peripheral vascular disease (HCC) 8/12   Lt SFA PTA   Presence of stent in LAD coronary artery 04/07/2012   Xience Expedition DES 2.75 mm x 18 mm (dilated to 3.0 mm)   Sleep apnea    does not wear CPAP   Type II diabetes mellitus (HCC)    insulin dependent   Past Surgical History:  Procedure Laterality Date   ABDOMINAL HYSTERECTOMY  1990    BREAST EXCISIONAL BIOPSY Right    CARDIAC CATHETERIZATION  11/04/2008   patent RCA, LM, and Circ, nl EF   CARDIAC CATHETERIZATION  02/20/2002   patent coronaries with the only abnormality being a smooth luminla irregularity in the mid intermediate ramus branch no felt to be hemodynamically significant, nl LV   CARDIAC CATHETERIZATION N/A 02/05/2016   Procedure: Left Heart Cath and Coronary Angiography;  Surgeon: Marykay Lex, MD;  Location: [facility] INVASIVE CV LAB;  Service: Cardiovascular;  Laterality: N/A;   CARDIAC CATHETERIZATION N/A 02/05/2016   Procedure: Coronary Stent Intervention;  Surgeon: Marykay Lex, MD;  Location: [facility] INVASIVE CV LAB;  Service: Cardiovascular;  Laterality: N/A;   CATARACT EXTRACTION W/ INTRAOCULAR LENS  IMPLANT, BILATERAL  2013   CORONARY ANGIOPLASTY WITH STENT PLACEMENT  04/07/2012   Xience Expedition DES 2.80mm x 18 mm (dilated to 3.0 mm) to the prox LAD    CORONARY ANGIOPLASTY WITH STENT PLACEMENT  2013; 2015   CORONARY ARTERY BYPASS GRAFT N/A 08/17/2017   Procedure: CORONARY ARTERY BYPASS GRAFTING (CABG)  TIMES 1 USING LEFT INTERNAL MAMMARY ARTERY;  Surgeon: Loreli Slot, MD;  Location: [facility] OR;  Service: Open Heart Surgery;  Laterality: N/A;   DILATION AND CURETTAGE OF UTERUS  <1990   ESOPHAGOGASTRODUODENOSCOPY N/A 05/23/2015   Procedure: ESOPHAGOGASTRODUODENOSCOPY (EGD);  Surgeon: Jeani Hawking, MD;  Location: [facility];  Service: Endoscopy;  Laterality: N/A;   ESOPHAGOGASTRODUODENOSCOPY (EGD) WITH PROPOFOL N/A 06/13/2015   Procedure: ESOPHAGOGASTRODUODENOSCOPY (EGD) WITH PROPOFOL;  Surgeon: Jeani Hawking, MD;  Location: [facility] ENDOSCOPY;  Service: Endoscopy;  Laterality: N/A;   ESOPHAGOGASTRODUODENOSCOPY (EGD) WITH PROPOFOL N/A 04/07/2018   Procedure: ESOPHAGOGASTRODUODENOSCOPY (EGD) WITH PROPOFOL;  Surgeon: Jeani Hawking, MD;  Location: [facility] ENDOSCOPY;  Service: Endoscopy;  Laterality: N/A;  fluoroscopy is needed   FRACTURE SURGERY     HAMMER TOE  SURGERY Bilateral 1993   with screws, on screw removed   JOINT REPLACEMENT     KNEE ARTHROSCOPY Bilateral    left x2, right x1   LAPAROSCOPIC CHOLECYSTECTOMY     LEFT HEART CATH AND CORONARY ANGIOGRAPHY N/A 08/11/2017   Procedure: LEFT HEART CATH AND CORONARY ANGIOGRAPHY;  Surgeon: Swaziland, Peter M, MD;  Location: [facility] INVASIVE CV LAB;  Service: Cardiovascular;  Laterality: N/A;   LEFT HEART CATHETERIZATION WITH CORONARY ANGIOGRAM N/A 04/07/2012   Procedure: LEFT HEART CATHETERIZATION WITH CORONARY ANGIOGRAM;  Surgeon: Runell Gess, MD;  Location: [facility] CATH LAB;  Service: Cardiovascular;  Laterality: N/A;   LEFT HEART CATHETERIZATION WITH CORONARY ANGIOGRAM N/A 05/27/2014   Procedure: LEFT HEART CATHETERIZATION WITH CORONARY ANGIOGRAM;  Surgeon: Runell Gess, MD; LAD 99% ISR, CFX 50-60%, RCA (dominant) no sig dz, EF 60%   LOWER EXTREMITY ANGIOGRAM  05/17/11   directional atherectomy to the prox L SFA using a LX Man TurboHawk, ballooned with a [place] balloon    ORIF ANKLE FRACTURE  07/09/2012   Procedure: OPEN REDUCTION INTERNAL FIXATION (ORIF) ANKLE FRACTURE;  Surgeon: Cammy Copa, MD;  Location: [facility] ORS;  Service: Orthopedics;  Laterality: Left;  open reduction internal fixation trimalleolar ankle fracture medial malleolous fixation   PERCUTANEOUS CORONARY STENT INTERVENTION (PCI-S) N/A 04/07/2012   Procedure: PERCUTANEOUS CORONARY STENT INTERVENTION (PCI-S);  Surgeon: Runell Gess, MD;  Location: [facility] CATH LAB;  Service: Cardiovascular;  Laterality: N/A;   PERCUTANEOUS CORONARY STENT INTERVENTION (PCI-S)  05/27/2014   Procedure: PERCUTANEOUS CORONARY STENT INTERVENTION (PCI-S);  Surgeon: Runell Gess, MD; 3 mm x 12 mm long Xience Xpedition DES to the proximal LAD   SAVORY DILATION N/A 06/13/2015   Procedure: SAVORY DILATION;  Surgeon: Jeani Hawking, MD;  Location: [facility];  Service: Endoscopy;  Laterality: N/A;   SAVORY DILATION N/A 04/07/2018   Procedure: SAVORY  DILATION;  Surgeon: Jeani Hawking, MD;  Location: [facility];  Service: Endoscopy;  Laterality: N/A;   TEE WITHOUT CARDIOVERSION N/A 08/17/2017   Procedure: TRANSESOPHAGEAL ECHOCARDIOGRAM (TEE);  Surgeon: Loreli Slot, MD;  Location: [facility] OR;  Service: Open Heart Surgery;  Laterality: N/A;   TONSILLECTOMY     TOTAL KNEE ARTHROPLASTY Left 2003   TOTAL KNEE REVISION Left 11/05/2013   Procedure: LEFT TOTAL KNEE RESECTION;  Surgeon: Shelda Pal, MD;  Location: [facility];  Service: Orthopedics;  Laterality: Left;   TOTAL KNEE REVISION Left 2007   opened in 2006 and cleaned out, 2007 revision   TOTAL KNEE REVISION Left 12/31/2013   Procedure: RE-INPLANTATION LEFT TOTAL KNEE ;  Surgeon: Shelda Pal, MD;  Location: [facility];  Service: Orthopedics;  Laterality: Left;   VIDEO ASSISTED THORACOSCOPY (VATS)/  LOBECTOMY  01/2009     Current Meds  Medication Sig   furosemide (LASIX) 40 MG tablet Take 40 mg by mouth as needed.   insulin NPH-regular Human (70-30) 100 UNIT/ML injection Inject into the skin. 60 units in the morning and 20 units at bedtme   lisinopril-hydrochlorothiazide (PRINZIDE,ZESTORETIC) 20-25 MG per tablet Take 1 tablet by mouth daily.   metoCLOPramide (REGLAN) 10 MG tablet Take 10 mg by mouth 2 (two) times daily. Per patient   metoprolol tartrate (LOPRESSOR) 50 MG tablet 50 mg in the morning and 25 mg at bedtime   pantoprazole (PROTONIX) 40 MG tablet TAKE 1 TABLET BY MOUTH DAILY.   simvastatin (ZOCOR) 40 MG tablet Take 1 tablet (40 mg total) by mouth daily at 6 PM.   spironolactone (ALDACTONE) 25 MG tablet Take 25 mg by mouth as needed.   Vitamin D, Cholecalciferol, 1000 units TABS Take 1,000 Units by mouth every morning.    warfarin (COUMADIN) 7.5 MG tablet TAKE 1/2 TO 1 TABLET DAILY AS DIRECTED BY COUMADIN CLINIC     Allergies:   Levofloxacin and Morphine and related   Social History   Tobacco Use   Smoking status: Former Smoker    Packs/day: 1.00     Years: 20.00    Pack years: 20.00    Types: Cigarettes    Quit date: 09/27/1984    Years since quitting: 35.6   Smokeless tobacco: Never Used  Vaping Use   Vaping Use: Never used  Substance Use Topics   Alcohol use: No   Drug use: No     Family Hx: The patient's family history includes Coronary artery disease in her brother; Hypertension in her mother and sister.  ROS:   Please see the history of present illness.     All other systems reviewed and are negative.   Labs/Other Tests and Data Reviewed:    Recent Labs: 11/16/2019: BNP 79.6; BUN 12; Creatinine, Ser 1.21; Potassium 4.2; Sodium 137   Recent Lipid Panel Lab Results  Component Value Date/Time   CHOL 133 08/10/2017 11:41 AM   TRIG 78 08/10/2017 11:41 AM   HDL 46 08/10/2017 11:41 AM   CHOLHDL 2.9 08/10/2017 11:41 AM   LDLCALC 71 08/10/2017 11:41 AM   LDLDIRECT 143 (H) 10/10/2008 08:42 PM    Wt Readings from Last 3 Encounters:  05/21/20 290 lb (131.5 kg)  03/28/20 290 lb (131.5 kg)  10/24/19 (!) 300 lb 6.4 oz (136.3 kg)     Exam:    Vital Signs:  BP (!) 145/82    Pulse 81    Ht 5\' 3"  (1.6 m)    Wt 290 lb (131.5 kg)    BMI 51.37 kg/m    Well nourished, well developed female in no  acute distress.   ASSESSMENT & PLAN:    1.  Chronic diastolic CHF-euvolemic today.  Weight today 290 pounds.  No increased activity intolerance or DOE. Continue furosemide, metoprolol, Prinzide, spironolactone Heart healthy low-sodium diet-salty 6 given Increase physical activity as tolerated  PAF-heart rate today 81 bpm.  Better heart rate control at home. Continue metoprolol, warfarin-no aspirin. Heart healthy low-sodium diet-salty 6 given Increase physical activity as tolerated Avoid triggers caffeine, chocolate, EtOH etc.  Coronary artery disease status post CABG-no chest pain today.  Single-vessel CABG 11/18 Continue metoprolol, simvastatin, Prinzide, Heart healthy low-sodium diet-salty 6 given Increase physical  activity as tolerated   Essential hypertension-BP today 145/82.  Has not been checking regularly at home. Continue metoprolol, spironolactone, Prinzide  Heart healthy low-sodium diet-salty 6 given-salty 6 given Increase physical activity as tolerated Keep blood pressure log  Disposition: Follow-up with Dr. Salena Saner or myself in 1 month for blood pressure reevaluation.  COVID-19 Education: The signs and symptoms of COVID-19 were discussed with the patient and how to seek care for testing (follow up with PCP or arrange E-visit).  The importance of social distancing was discussed today.  Patient Risk:   After full review of this patients clinical status, I feel that they are at least moderate risk at this time.  Time:   Today, I have spent 12 minutes minutes with the patient with telehealth technology discussing greater than 20 minutes reviewing patient's chart, labs, medications, and cardiac studies.     Medication Adjustments/Labs and Tests Ordered: Current medicines are reviewed at length with the patient today.  Concerns regarding medicines are outlined above.   Tests Ordered: No orders of the defined types were placed in this encounter.  Medication Changes: No orders of the defined types were placed in this encounter.   Disposition:  in 1 month(s)  Signed, Thomasene Ripple. Sennie Borden NP-C    05/01/2019 11:58 AM    [facility] [address] Office [phone] Fax [phone]

## 2020-05-21 ENCOUNTER — Encounter: Payer: Self-pay | Admitting: General Practice

## 2020-05-21 ENCOUNTER — Telehealth (INDEPENDENT_AMBULATORY_CARE_PROVIDER_SITE_OTHER): Payer: HMO | Admitting: General Practice

## 2020-05-21 VITALS — BP 145/82 | HR 81 | Ht 63.0 in | Wt 290.0 lb

## 2020-05-21 DIAGNOSIS — I480 Paroxysmal atrial fibrillation: Secondary | ICD-10-CM | POA: Diagnosis not present

## 2020-05-21 DIAGNOSIS — I10 Essential (primary) hypertension: Secondary | ICD-10-CM | POA: Diagnosis not present

## 2020-05-21 DIAGNOSIS — I5032 Chronic diastolic (congestive) heart failure: Secondary | ICD-10-CM

## 2020-05-21 DIAGNOSIS — I2510 Atherosclerotic heart disease of native coronary artery without angina pectoris: Secondary | ICD-10-CM

## 2020-05-21 NOTE — Progress Notes (Signed)
Thanks, Denyse Amass.  if needed we can increase lisinopril to 40.  Unfortunately, there is no 40/25 lisinop/HCT combo, though. Will need separate Rx for lisinop 40 and HCT 25.

## 2020-05-21 NOTE — Patient Instructions (Signed)
Medication Instructions:  Continue current medications  *If you need a refill on your cardiac medications before your next appointment, please call your pharmacy*   Lab Work: None Ordered   Testing/Procedures: None Ordered   Follow-Up: At [facility], you and your health needs are our priority.  As part of our continuing mission to provide you with exceptional heart care, we have created designated Provider Care Teams.  These Care Teams include your primary Cardiologist (physician) and Advanced Practice Providers (APPs -  Physician Assistants and Nurse Practitioners) who all work together to provide you with the care you need, when you need it.  We recommend signing up for the patient portal called "MyChart".  Sign up information is provided on this After Visit Summary.  MyChart is used to connect with patients for Virtual Visits (Telemedicine).  Patients are able to view lab/test results, encounter notes, upcoming appointments, etc.  Non-urgent messages can be sent to your provider as well.   To learn more about what you can do with MyChart, go to [email or web address].    Your next appointment:   1 Month  The format for your next appointment:   In Person  Provider:   Avanell Shackleton or Dr Sallyanne Kuster   Other Instructions Keep daily blood pressure log and increase physical activites

## 2020-06-12 ENCOUNTER — Telehealth: Payer: Self-pay | Admitting: Cardiovascular Disease

## 2020-06-12 NOTE — Telephone Encounter (Signed)
*  STAT* If patient is at the pharmacy, call can be transferred to refill team.   1. Which medications need to be refilled? (please list name of each medication and dose if known) pantoprazole (PROTONIX) 40 MG tablet / warfarin (COUMADIN) 7.5 MG tablet  2. Which pharmacy/location (including street and city if local pharmacy) is medication to be sent to? [facility] - [place], [place]  3. Do they need a 30 day or 90 day supply? [place]

## 2020-06-13 ENCOUNTER — Other Ambulatory Visit: Payer: Self-pay

## 2020-06-13 MED ORDER — PANTOPRAZOLE SODIUM 40 MG PO TBEC
40.0000 mg | DELAYED_RELEASE_TABLET | Freq: Every day | ORAL | 3 refills | Status: DC
Start: 2020-06-13 — End: 2021-06-17

## 2020-06-13 MED ORDER — WARFARIN SODIUM 7.5 MG PO TABS: ORAL_TABLET | ORAL | 0 refills | Status: DC

## 2020-06-18 NOTE — Progress Notes (Signed)
Cardiology Clinic Note   Patient Name: CARRISSA TAITANO Date of Encounter: 06/20/2020  Primary Care Provider:  [name], MD Primary Cardiologist:  Sanda Klein, MD  Patient Profile    [name] is a 69 y.o. female who presents for a follow-up evaluation of her CHF and CAD.  Past Medical History    Past Medical History:  Diagnosis Date  . Arthritis    "knees" (02/05/2016)  . Cancer ([place]) 5/10   [facility] V[facility]   . CHF (congestive heart failure) ([place])   . Coronary artery disease   . GERD (gastroesophageal reflux disease)    tx meds  . H/O hiatal hernia   . History of blood transfusion "several"   last april 2015 s/p knee surgery (02/05/2016)  . Hypercholesteremia   . Hypertension   . Lung cancer ([place])    2010, surgery 18% left lung no radiation or chemo  . Non-STEMI (non-ST elevated myocardial infarction) ([place]) 04/07/2012   See cath results below  . Paroxysmal atrial fibrillation ([place]) 04/07/2012   On Warfarin  . Peripheral vascular disease ([place]) 8/12   Lt SFA PTA  . Presence of stent in LAD coronary artery 04/07/2012   Xience Expedition DES 2.75 mm x 18 mm (dilated to 3.0 mm)  . Sleep apnea    does not wear CPAP  . Type II diabetes mellitus (HCC)    insulin dependent   Past Surgical History:  Procedure Laterality Date  . ABDOMINAL HYSTERECTOMY  1990  . BREAST EXCISIONAL BIOPSY Right   . CARDIAC CATHETERIZATION  11/04/2008   patent RCA, LM, and Circ, nl EF  . CARDIAC CATHETERIZATION  02/20/2002   patent coronaries with the only abnormality being a smooth luminla irregularity in the mid intermediate ramus branch no felt to be hemodynamically significant, nl LV  . CARDIAC CATHETERIZATION N/A 02/05/2016   Procedure: Left Heart Cath and Coronary Angiography;  Surgeon: Leonie Man, MD;  Location: [facility];  Service: Cardiovascular;  Laterality: N/A;  . CARDIAC CATHETERIZATION N/A 02/05/2016   Procedure: Coronary Stent Intervention;  Surgeon:  Leonie Man, MD;  Location: [facility];  Service: Cardiovascular;  Laterality: N/A;  . CATARACT EXTRACTION W/ INTRAOCULAR LENS  IMPLANT, BILATERAL  2013  . CORONARY ANGIOPLASTY WITH STENT PLACEMENT  04/07/2012   Xience Expedition DES 2.28mm x 18 mm (dilated to 3.0 mm) to the prox LAD   . CORONARY ANGIOPLASTY WITH STENT PLACEMENT  2013; 2015  . CORONARY ARTERY BYPASS GRAFT N/A 08/17/2017   Procedure: CORONARY ARTERY BYPASS GRAFTING (CABG) TIMES 1 USING LEFT INTERNAL MAMMARY ARTERY;  Surgeon: Melrose Nakayama, MD;  Location: [facility];  Service: Open Heart Surgery;  Laterality: N/A;  . DILATION AND CURETTAGE OF UTERUS  <1990  . ESOPHAGOGASTRODUODENOSCOPY N/A 05/23/2015   Procedure: ESOPHAGOGASTRODUODENOSCOPY (EGD);  Surgeon: Carol Ada, MD;  Location: [facility];  Service: Endoscopy;  Laterality: N/A;  . ESOPHAGOGASTRODUODENOSCOPY (EGD) WITH PROPOFOL N/A 06/13/2015   Procedure: ESOPHAGOGASTRODUODENOSCOPY (EGD) WITH PROPOFOL;  Surgeon: Carol Ada, MD;  [facility];  Service: Endoscopy;  Laterality: N/A;  . ESOPHAGOGASTRODUODENOSCOPY (EGD) WITH PROPOFOL N/A 04/07/2018   Procedure: ESOPHAGOGASTRODUODENOSCOPY (EGD) WITH PROPOFOL;  Surgeon: Carol Ada, MD;  [facility];  Service: Endoscopy;  Laterality: N/A;  fluoroscopy is needed  . FRACTURE SURGERY    . HAMMER TOE SURGERY Bilateral 1993   with screws, on screw removed  . JOINT REPLACEMENT    . KNEE ARTHROSCOPY Bilateral    left x2,  right x1  . LAPAROSCOPIC CHOLECYSTECTOMY    . LEFT HEART CATH AND CORONARY ANGIOGRAPHY N/A 08/11/2017   Procedure: LEFT HEART CATH AND CORONARY ANGIOGRAPHY;  Surgeon: Martinique, Peter M, MD;  Location: [facility];  Service: Cardiovascular;  Laterality: N/A;  . LEFT HEART CATHETERIZATION WITH CORONARY ANGIOGRAM N/A 04/07/2012   Procedure: LEFT HEART CATHETERIZATION WITH CORONARY ANGIOGRAM;  Surgeon: [name], MD;  Location: [facility] LAB;  Service: Cardiovascular;   Laterality: N/A;  . LEFT HEART CATHETERIZATION WITH CORONARY ANGIOGRAM N/A 05/27/2014   Procedure: LEFT HEART CATHETERIZATION WITH CORONARY ANGIOGRAM;  Surgeon: [name], MD; LAD 99% ISR, CFX 50-60%, RCA (dominant) no sig dz, EF 60%  . LOWER EXTREMITY ANGIOGRAM  05/17/11   directional atherectomy to the prox L SFA using a LX Man TurboHawk, ballooned with a [employer] balloon   . ORIF ANKLE FRACTURE  07/09/2012   Procedure: OPEN REDUCTION INTERNAL FIXATION (ORIF) ANKLE FRACTURE;  Surgeon: Meredith Pel, MD;  [facility];  Service: Orthopedics;  Laterality: Left;  open reduction internal fixation trimalleolar ankle fracture medial malleolous fixation  . PERCUTANEOUS CORONARY STENT INTERVENTION (PCI-S) N/A 04/07/2012   Procedure: PERCUTANEOUS CORONARY STENT INTERVENTION (PCI-S);  Surgeon: [name], MD;  [facility] CATH LAB;  Service: Cardiovascular;  Laterality: N/A;  . PERCUTANEOUS CORONARY STENT INTERVENTION (PCI-S)  05/27/2014   Procedure: PERCUTANEOUS CORONARY STENT INTERVENTION (PCI-S);  Surgeon: [name], MD; 3 mm x 12 mm long Xience Xpedition DES to the proximal LAD  . SAVORY DILATION N/A 06/13/2015   Procedure: SAVORY DILATION;  Surgeon: Carol Ada, MD;  [facility];  Service: Endoscopy;  Laterality: N/A;  . SAVORY DILATION N/A 04/07/2018   Procedure: SAVORY DILATION;  Surgeon: Carol Ada, MD;  [facility];  Service: Endoscopy;  Laterality: N/A;  . TEE WITHOUT CARDIOVERSION N/A 08/17/2017   Procedure: TRANSESOPHAGEAL ECHOCARDIOGRAM (TEE);  Surgeon: Melrose Nakayama, MD;  Location: [facility];  Service: Open Heart Surgery;  Laterality: N/A;  . TONSILLECTOMY    . TOTAL KNEE ARTHROPLASTY Left 2003  . TOTAL KNEE REVISION Left 11/05/2013   Procedure: LEFT TOTAL KNEE RESECTION;  Surgeon: Mauri Pole, MD;  [facility];  Service: Orthopedics;  Laterality: Left;  . TOTAL KNEE REVISION Left 2007   opened in 2006 and cleaned out, 2007  revision  . TOTAL KNEE REVISION Left 12/31/2013   Procedure: RE-INPLANTATION LEFT TOTAL KNEE ;  Surgeon: Mauri Pole, MD;  [facility];  Service: Orthopedics;  Laterality: Left;  Marland Kitchen VIDEO ASSISTED THORACOSCOPY (VATS)/ LOBECTOMY  01/2009    Allergies  Allergies  Allergen Reactions  . Levofloxacin Itching  . Morphine And Related Itching    Pt reports oxycodone history with no allergy.    History of Present Illness    Ms. Lewis PMH of CABG (LIMA-LAD 11/18), PAD, chronic diastolic CHF, and paroxysmal atrial fibrillation.  Her PMH also includes type 2 diabetes on insulin, and morbid obesity.  She underwent CABG November 2018 for recurrent in-stent restenosis with multiple PCI.  (PCI with DES to LAD 2013, 2015 DES to distal LAD, 4/17 received DES in proximal LAD, 11/18 again with 3 stenosis and was referred for single-vessel CABG).  She is also status post left superficial femoral artery arthrectomy.  She was last seen by Dr. Sallyanne Kuster on 03/28/2020.  During that time she had diuresed around 10 pounds and was having less dyspnea and edema.  She is back to her baseline weight of  290 pounds.  She was slightly tachycardic after getting up to weigh and ambulating to the exam room.  Her heart rate returned to normal after resting for a few minutes.  She complained of difficulty ambulating due to knee problems and used a wheelchair.  EKG showed atrial flutter with variable AV block.  She denied angina, orthopnea, PND, presyncope/syncope and denied falls.  She was seen virtually 05/21/2020 in follow-up and stated she felt well.  She stated she did not regularly check her blood pressure.  She had been taking all her medications as prescribed.  She had been somewhat limited in her physical activity due to her knee pain.  She did have some left ankle swelling but she stated it was not any worse than it had been.  She did try to do some activity in her wheelchair including leg extensions and pointing her  toes.  I recommended that she incorporate upper body exercises such as arm rotations, bicep curls, and arm raises.  She had been maintaining a low-salt diet and her weight had been stable.  Her blood pressure was slightly elevated  at 145/82.  I recommended she  keep a blood pressure log and follow-up in 1 month for further evaluation.  I  gave her the salty 6 diet sheet.  She presents to the clinic today for follow-up evaluation states she feels well today.  She has increased her physical activity and is now doing upper arm exercises as well as light walking.  She states that she does notice some increased heart rate with increased physical activity.  She monitor her blood pressure since we spoke last virtually.  Her blood pressures are well controlled on 120s-130s over 70s.  I will give her the salty 6 diet sheet, have her maintain increase physical activity, and have her follow-up in 6 months.  Today she denies chest pain, shortness of breath, lower extremity edema, fatigue, palpitations, melena, hematuria, hemoptysis, diaphoresis, weakness, presyncope, syncope, orthopnea, and PND.  Home Medications    Prior to Admission medications   Medication Sig Start Date End Date Taking? Authorizing Provider  furosemide (LASIX) 40 MG tablet Take 40 mg by mouth as needed.    [provider]  insulin NPH-regular Human (70-30) 100 UNIT/ML injection Inject into the skin. 60 units in the morning and 20 units at bedtme    [provider]  lisinopril-hydrochlorothiazide (PRINZIDE,ZESTORETIC) 20-25 MG per tablet Take 1 tablet by mouth daily.    [provider]  metoCLOPramide (REGLAN) 10 MG tablet Take 10 mg by mouth 2 (two) times daily. Per patient    [provider]  metoprolol tartrate (LOPRESSOR) 50 MG tablet 50 mg in the morning and 25 mg at bedtime    [provider]  pantoprazole (PROTONIX) 40 MG tablet Take 1 tablet (40 mg total) by mouth daily. 06/13/20    Croitoru, Mihai, MD  simvastatin (ZOCOR) 40 MG tablet Take 1 tablet (40 mg total) by mouth daily at 6 PM. 04/01/20   Croitoru, Mihai, MD  spironolactone (ALDACTONE) 25 MG tablet Take 25 mg by mouth as needed.    [provider]  Vitamin D, Cholecalciferol, 1000 units TABS Take 1,000 Units by mouth every morning.     [provider]  warfarin (COUMADIN) 7.5 MG tablet TAKE 1/2 TO 1 TABLET DAILY AS DIRECTED BY COUMADIN CLINIC 06/13/20   Croitoru, Dani Gobble, MD    Family History    Family History  Problem Relation Age of Onset  .  Hypertension Mother   . Coronary artery disease Brother   . Hypertension Sister    She indicated that her mother is deceased. She indicated that her father is deceased. She indicated that the status of her sister is unknown. She indicated that the status of her brother is unknown.  Social History    Social History   Socioeconomic History  . Marital status: Single    Spouse name: Not on file  . Number of children: Not on file  . Years of education: Not on file  . Highest education level: Not on file  Occupational History  . Occupation: Retired  Tobacco Use  . Smoking status: Former Smoker    Packs/day: 1.00    Years: 20.00    Pack years: 20.00    Types: Cigarettes    Quit date: 09/27/1984    Years since quitting: 35.7  . Smokeless tobacco: Never Used  Vaping Use  . Vaping Use: Never used  Substance and Sexual Activity  . Alcohol use: No  . Drug use: No  . Sexual activity: Not on file  Other Topics Concern  . Not on file  Social History Narrative   Patient lives alone.   Social Determinants of Health   Financial Resource Strain:   . Difficulty of Paying Living Expenses: Not on file  Food Insecurity: No Food Insecurity  . Worried About Charity fundraiser in the Last Year: Never true  . Ran Out of Food in the Last Year: Never true  Transportation Needs: No Transportation Needs  . Lack of Transportation (Medical): No  . Lack of  Transportation (Non-Medical): No  Physical Activity:   . Days of Exercise per Week: Not on file  . Minutes of Exercise per Session: Not on file  Stress: Stress Concern Present  . Feeling of Stress : Very much  Social Connections:   . Frequency of Communication with Friends and Family: Not on file  . Frequency of Social Gatherings with Friends and Family: Not on file  . Attends Religious Services: Not on file  . Active Member of Clubs or Organizations: Not on file  . Attends Archivist Meetings: Not on file  . Marital Status: Not on file  Intimate Partner Violence:   . Fear of Current or Ex-Partner: Not on file  . Emotionally Abused: Not on file  . Physically Abused: Not on file  . Sexually Abused: Not on file     Review of Systems    General:  No chills, fever, night sweats or weight changes.  Cardiovascular:  No chest pain, dyspnea on exertion, edema, orthopnea, palpitations, paroxysmal nocturnal dyspnea. Dermatological: No rash, lesions/masses Respiratory: No cough, dyspnea Urologic: No hematuria, dysuria Abdominal:   No nausea, vomiting, diarrhea, bright red blood per rectum, melena, or hematemesis Neurologic:  No visual changes, wkns, changes in mental status. All other systems reviewed and are otherwise negative except as noted above.  Physical Exam    VS:  BP 132/80 (BP Location: Left Arm, Patient Position: Sitting, Cuff Size: Large)   Pulse 86   Temp (!) 97 F (36.1 C) (Tympanic)   Resp 16   Ht 5\' 3"  (1.6 m)   Wt 292 lb (132.5 kg)   SpO2 98%   BMI 51.73 kg/m  , BMI Body mass index is 51.73 kg/m. GEN: Well nourished, well developed, in no acute distress. HEENT: normal. Neck: Supple, no JVD, carotid bruits, or masses. Cardiac: RRR, no murmurs, rubs, or gallops. No clubbing, cyanosis,  edema.  Radials/DP/PT 2+ and equal bilaterally.  Respiratory:  Respirations regular and unlabored, clear to auscultation bilaterally. GI: Soft, nontender, nondistended, BS  + x 4. MS: no deformity or atrophy. Skin: warm and dry, no rash. Neuro:  Strength and sensation are intact. Psych: Normal affect.  Accessory Clinical Findings    Recent Labs: 11/16/2019: BNP 79.6; BUN 12; Creatinine, Ser 1.21; Potassium 4.2; Sodium 137   Recent Lipid Panel    Component Value Date/Time   CHOL 133 08/10/2017 1141   TRIG 78 08/10/2017 1141   HDL 46 08/10/2017 1141   CHOLHDL 2.9 08/10/2017 1141   VLDL 16 08/10/2017 1141   LDLCALC 71 08/10/2017 1141   LDLDIRECT 143 (H) 10/10/2008 2042    ECG personally reviewed by me today-none today.  EKG 03/28/2020 Atrial flutter with 2 1 AV block 84 bpm  Echocardiogram 08/15/2017 Study Conclusions   - Left ventricle: The cavity size was normal. Systolic function was  normal. The estimated ejection fraction was in the range of 55%  to 60%. Wall motion was normal; there were no regional wall  motion abnormalities. Features are consistent with a pseudonormal  left ventricular filling pattern, with concomitant abnormal  relaxation and increased filling pressure (grade 2 diastolic  dysfunction).  - Left atrium: The atrium was mildly dilated.   Assessment & Plan   1.  Essential hypertension-BP today  132/80.  Has not been checking regularly at home. Continue metoprolol, spironolactone, Prinzide Heart healthy low-sodium diet-reviewed salty 6 given-salty 6 given Increase physical activity as tolerated Continue blood pressure log  Chronic diastolic CHF-continues to be euvolemic today.  Weight today 292 pounds.  No increased activity intolerance or DOE. Continue furosemide, metoprolol, Prinzide, spironolactone Heart healthy low-sodium diet-salty 6 given Increase physical activity as tolerated  PAF-heart rate today  86 bpm.  Better heart rate control at home. Continue metoprolol, warfarin-no aspirin. Heart healthy low-sodium diet-salty 6 given Increase physical activity as tolerated Avoid triggers caffeine,  chocolate, EtOH etc.  Coronary artery disease status post CABG-no chest pain today.  Single-vessel CABG 11/18 Continue metoprolol, simvastatin, Prinzide, Heart healthy low-sodium diet-salty 6 given Increase physical activity as tolerated  Disposition: Follow-up with Dr. Sallyanne Kuster or me in 6 months.  Jossie Ng. Tiawanna Luchsinger NP-C    06/20/2020, 11:11 AM [address] Office [phone] Fax [phone]  Notice: This dictation was prepared with Dragon dictation along with smaller phrase technology. Any transcriptional errors that result from this process are unintentional and may not be corrected upon review.

## 2020-06-20 ENCOUNTER — Encounter: Payer: Self-pay | Admitting: General Practice

## 2020-06-20 ENCOUNTER — Ambulatory Visit: Payer: HMO | Admitting: General Practice

## 2020-06-20 ENCOUNTER — Ambulatory Visit (INDEPENDENT_AMBULATORY_CARE_PROVIDER_SITE_OTHER): Payer: HMO

## 2020-06-20 ENCOUNTER — Other Ambulatory Visit: Payer: Self-pay

## 2020-06-20 VITALS — BP 132/80 | HR 86 | Temp 97.0°F | Resp 16 | Ht 63.0 in | Wt 292.0 lb

## 2020-06-20 DIAGNOSIS — I2510 Atherosclerotic heart disease of native coronary artery without angina pectoris: Secondary | ICD-10-CM

## 2020-06-20 DIAGNOSIS — Z5181 Encounter for therapeutic drug level monitoring: Secondary | ICD-10-CM

## 2020-06-20 DIAGNOSIS — I480 Paroxysmal atrial fibrillation: Secondary | ICD-10-CM

## 2020-06-20 DIAGNOSIS — I5032 Chronic diastolic (congestive) heart failure: Secondary | ICD-10-CM | POA: Diagnosis not present

## 2020-06-20 DIAGNOSIS — I10 Essential (primary) hypertension: Secondary | ICD-10-CM

## 2020-06-20 DIAGNOSIS — Z7901 Long term (current) use of anticoagulants: Secondary | ICD-10-CM

## 2020-06-20 LAB — POCT INR: INR: 2.4 (ref 2.0–3.0)

## 2020-06-20 NOTE — Patient Instructions (Signed)
Medication Instructions:  The current medical regimen is effective;  continue present plan and medications as directed. Please refer to the Current Medication list given to you today. *If you need a refill on your cardiac medications before your next appointment, please call your pharmacy*  Lab Work:   Testing/Procedures:  NONE    NONE  Special Instructions TAKE AND LOG YOUR BLOOD PRESSURE AT LEAST TWICE A WEEK  PLEASE READ AND FOLLOW SALTY 6-ATTACHED  PLEASE CONTINUE PHYSICAL ACTIVITY AND CHAIR EXERCISES  Follow-Up: Your next appointment:  6 month(s) Virtual Visit   with Sanda Klein, MD -[place], FNP-C  At [facility], you and your health needs are our priority.  As part of our continuing mission to provide you with exceptional heart care, we have created designated Provider Care Teams.  These Care Teams include your primary Cardiologist (physician) and Advanced Practice Providers (APPs -  Physician Assistants and Nurse Practitioners) who all work together to provide you with the care you need, when you need it.

## 2020-06-20 NOTE — Patient Instructions (Signed)
Continue with 1/2 tablet daily except 1 tablet each Tuesday,and Thursday.  Repeat INR in 6 weeks

## 2020-07-02 DIAGNOSIS — E1065 Type 1 diabetes mellitus with hyperglycemia: Secondary | ICD-10-CM | POA: Diagnosis not present

## 2020-07-02 DIAGNOSIS — I129 Hypertensive chronic kidney disease with stage 1 through stage 4 chronic kidney disease, or unspecified chronic kidney disease: Secondary | ICD-10-CM | POA: Diagnosis not present

## 2020-07-02 DIAGNOSIS — I2510 Atherosclerotic heart disease of native coronary artery without angina pectoris: Secondary | ICD-10-CM | POA: Diagnosis not present

## 2020-07-02 DIAGNOSIS — Z23 Encounter for immunization: Secondary | ICD-10-CM | POA: Diagnosis not present

## 2020-07-02 DIAGNOSIS — I700 Atherosclerosis of aorta: Secondary | ICD-10-CM | POA: Diagnosis not present

## 2020-07-02 DIAGNOSIS — E1059 Type 1 diabetes mellitus with other circulatory complications: Secondary | ICD-10-CM | POA: Diagnosis not present

## 2020-07-02 DIAGNOSIS — Z0000 Encounter for general adult medical examination without abnormal findings: Secondary | ICD-10-CM | POA: Diagnosis not present

## 2020-07-08 DIAGNOSIS — N1831 Chronic kidney disease, stage 3a: Secondary | ICD-10-CM | POA: Diagnosis not present

## 2020-07-08 DIAGNOSIS — I4821 Permanent atrial fibrillation: Secondary | ICD-10-CM | POA: Diagnosis not present

## 2020-07-08 DIAGNOSIS — E782 Mixed hyperlipidemia: Secondary | ICD-10-CM | POA: Diagnosis not present

## 2020-07-08 DIAGNOSIS — I2510 Atherosclerotic heart disease of native coronary artery without angina pectoris: Secondary | ICD-10-CM | POA: Diagnosis not present

## 2020-07-08 DIAGNOSIS — I129 Hypertensive chronic kidney disease with stage 1 through stage 4 chronic kidney disease, or unspecified chronic kidney disease: Secondary | ICD-10-CM | POA: Diagnosis not present

## 2020-07-08 DIAGNOSIS — E10319 Type 1 diabetes mellitus with unspecified diabetic retinopathy without macular edema: Secondary | ICD-10-CM | POA: Diagnosis not present

## 2020-07-08 DIAGNOSIS — I700 Atherosclerosis of aorta: Secondary | ICD-10-CM | POA: Diagnosis not present

## 2020-07-08 DIAGNOSIS — I5032 Chronic diastolic (congestive) heart failure: Secondary | ICD-10-CM | POA: Diagnosis not present

## 2020-07-08 DIAGNOSIS — I130 Hypertensive heart and chronic kidney disease with heart failure and stage 1 through stage 4 chronic kidney disease, or unspecified chronic kidney disease: Secondary | ICD-10-CM | POA: Diagnosis not present

## 2020-07-08 DIAGNOSIS — Z0000 Encounter for general adult medical examination without abnormal findings: Secondary | ICD-10-CM | POA: Diagnosis not present

## 2020-07-08 DIAGNOSIS — E1065 Type 1 diabetes mellitus with hyperglycemia: Secondary | ICD-10-CM | POA: Diagnosis not present

## 2020-07-23 DIAGNOSIS — Z961 Presence of intraocular lens: Secondary | ICD-10-CM | POA: Diagnosis not present

## 2020-07-23 DIAGNOSIS — H26493 Other secondary cataract, bilateral: Secondary | ICD-10-CM | POA: Diagnosis not present

## 2020-07-23 DIAGNOSIS — E113393 Type 2 diabetes mellitus with moderate nonproliferative diabetic retinopathy without macular edema, bilateral: Secondary | ICD-10-CM | POA: Diagnosis not present

## 2020-07-23 DIAGNOSIS — H524 Presbyopia: Secondary | ICD-10-CM | POA: Diagnosis not present

## 2020-08-01 ENCOUNTER — Ambulatory Visit (INDEPENDENT_AMBULATORY_CARE_PROVIDER_SITE_OTHER): Payer: HMO

## 2020-08-01 ENCOUNTER — Other Ambulatory Visit: Payer: Self-pay

## 2020-08-01 DIAGNOSIS — Z7901 Long term (current) use of anticoagulants: Secondary | ICD-10-CM | POA: Diagnosis not present

## 2020-08-01 DIAGNOSIS — Z5181 Encounter for therapeutic drug level monitoring: Secondary | ICD-10-CM | POA: Diagnosis not present

## 2020-08-01 DIAGNOSIS — I480 Paroxysmal atrial fibrillation: Secondary | ICD-10-CM | POA: Diagnosis not present

## 2020-08-01 LAB — POCT INR: INR: 2.4 (ref 2.0–3.0)

## 2020-08-01 NOTE — Patient Instructions (Signed)
Continue with 1/2 tablet daily except 1 tablet each Tuesday,and Thursday.  Repeat INR in 6 weeks

## 2020-08-11 ENCOUNTER — Other Ambulatory Visit: Payer: Self-pay

## 2020-08-11 ENCOUNTER — Other Ambulatory Visit: Payer: Self-pay | Admitting: *Deleted

## 2020-08-11 MED ORDER — METOPROLOL TARTRATE 50 MG PO TABS: ORAL_TABLET | ORAL | 3 refills | Status: DC

## 2020-08-11 NOTE — Patient Outreach (Signed)
  [facility] [employer] [employer]) Care Management Chronic Special Needs Program    08/11/2020  Name: [name], DOB: 22-Feb-1951  MRN: [id number]   [name] is enrolled in a chronic special needs plan for Heart Failure  [facility] will continue to provide services for this member through 09/26/20.  The [employer] care management team will assume care 09/27/2020.   Quinn Plowman RN,BSN,CCM Chronic Care Management Coordinator [facility] [phone]  .

## 2020-08-13 ENCOUNTER — Other Ambulatory Visit: Payer: Self-pay | Admitting: Cardiovascular Disease

## 2020-08-13 NOTE — Telephone Encounter (Signed)
°*  STAT* If patient is at the pharmacy, call can be transferred to refill team.   1. Which medications need to be refilled? (please list name of each medication and dose if known)  metoprolol tartrate (LOPRESSOR) 50 MG tablet  2. Which pharmacy/location (including street and city if local pharmacy) is medication to be sent to? [facility] - [place], [place]  3. Do they need a 30 day or 90 day supply?  90 day   [facility] called in to verify the dose of this medication. They want to verify if the quantity of of tablets represents the dose decrease to the 50 mg tablet in the morning and 25 mg tablet at bedtime. [facility] can be reached at [phone] reference number [id number]

## 2020-08-14 MED ORDER — METOPROLOL TARTRATE 50 MG PO TABS: ORAL_TABLET | ORAL | 3 refills | Status: DC

## 2020-08-14 NOTE — Telephone Encounter (Signed)
Can you tell, please?

## 2020-08-14 NOTE — Telephone Encounter (Signed)
Spoke with the pharmacist at [facility]. The patient's prescription refill for Metoprolol Tartrate 50 mg in the AM and 25 mg in the PM was sent in for too many tablets a month (180 tablets). They were calling for clarification.   They will send the patient 135 tablets for one and a half tablets a month (3 month supply).

## 2020-08-28 ENCOUNTER — Ambulatory Visit: Payer: HMO | Attending: Internal Medicine

## 2020-08-28 DIAGNOSIS — Z23 Encounter for immunization: Secondary | ICD-10-CM

## 2020-08-28 NOTE — Progress Notes (Signed)
   Covid-19 Vaccination Clinic  Name:  [name]    MRN: [id number] DOB: 14-Jun-1951  08/28/2020  [name] was observed post Covid-19 immunization for 15 minutes without incident. She was provided with Vaccine Information Sheet and instruction to access the V-Safe system.   [name] was instructed to call 911 with any severe reactions post vaccine: [place] Kitchen Difficulty breathing  . Swelling of face and throat  . A fast heartbeat  . A bad rash all over body  . Dizziness and weakness   Immunizations Administered    Name Date Dose VIS Date Route   Pfizer COVID-19 Vaccine 08/28/2020  1:37 PM 0.3 mL 07/16/2020 Intramuscular   Manufacturer: [place]   Lot: [id number]   [place]: [id number]

## 2020-09-03 ENCOUNTER — Telehealth: Payer: Self-pay | Admitting: Cardiovascular Disease

## 2020-09-03 NOTE — Telephone Encounter (Signed)
Advised patient, verbalized understanding  

## 2020-09-03 NOTE — Telephone Encounter (Signed)
Spoke with patient regarding indigestion. Patient stated she has been having indigestion for about 1 week with reflux when she belches. Indigestion occurs about 30 minutes after she eats and last about 30-60 minutes Confirmed she is taking her Pantoprazole  Denies any shortness of breath but does have "warm" feeling in both arms above elbow at times.  Advised patient to contact her gastroenterologist and will forward to Dr Sallyanne Kuster for review

## 2020-09-03 NOTE — Telephone Encounter (Signed)
Thanks. Meal-related symptoms are not likely to be cardiac in origin.

## 2020-09-03 NOTE — Telephone Encounter (Signed)
Patient states for the past week she has been experiencing indigestion every time she eats. She states the occurrences are always a few hours after eating, but she has not experienced any additional symptoms. Please call.

## 2020-09-04 DIAGNOSIS — H26492 Other secondary cataract, left eye: Secondary | ICD-10-CM | POA: Diagnosis not present

## 2020-09-15 ENCOUNTER — Other Ambulatory Visit: Payer: Self-pay

## 2020-09-15 ENCOUNTER — Ambulatory Visit (INDEPENDENT_AMBULATORY_CARE_PROVIDER_SITE_OTHER): Payer: HMO

## 2020-09-15 DIAGNOSIS — Z7901 Long term (current) use of anticoagulants: Secondary | ICD-10-CM | POA: Diagnosis not present

## 2020-09-15 DIAGNOSIS — Z5181 Encounter for therapeutic drug level monitoring: Secondary | ICD-10-CM | POA: Diagnosis not present

## 2020-09-15 DIAGNOSIS — I480 Paroxysmal atrial fibrillation: Secondary | ICD-10-CM | POA: Diagnosis not present

## 2020-09-15 LAB — POCT INR: INR: 2.8 (ref 2.0–3.0)

## 2020-09-15 NOTE — Patient Instructions (Signed)
Continue with 1/2 tablet daily except 1 tablet each Tuesday,and Thursday.  Repeat INR in 6 weeks

## 2020-09-23 ENCOUNTER — Telehealth: Payer: Self-pay | Admitting: Cardiovascular Disease

## 2020-09-23 NOTE — Telephone Encounter (Signed)
°*  STAT* If patient is at the pharmacy, call can be transferred to refill team.   1. Which medications need to be refilled? (please list name of each medication and dose if known)  warfarin (COUMADIN) 7.5 MG tablet  2. Which pharmacy/location (including street and city if local pharmacy) is medication to be sent to? [facility] ([facility] - [place], [place]  3. Do they need a 30 day or 90 day supply? [place]

## 2020-09-24 MED ORDER — WARFARIN SODIUM 7.5 MG PO TABS: ORAL_TABLET | ORAL | 0 refills | Status: DC

## 2020-09-30 ENCOUNTER — Other Ambulatory Visit: Payer: Self-pay

## 2020-10-27 ENCOUNTER — Ambulatory Visit (INDEPENDENT_AMBULATORY_CARE_PROVIDER_SITE_OTHER): Payer: HMO

## 2020-10-27 ENCOUNTER — Other Ambulatory Visit: Payer: Self-pay

## 2020-10-27 DIAGNOSIS — Z7901 Long term (current) use of anticoagulants: Secondary | ICD-10-CM

## 2020-10-27 DIAGNOSIS — Z5181 Encounter for therapeutic drug level monitoring: Secondary | ICD-10-CM

## 2020-10-27 DIAGNOSIS — I480 Paroxysmal atrial fibrillation: Secondary | ICD-10-CM

## 2020-10-27 LAB — POCT INR: INR: 2.4 (ref 2.0–3.0)

## 2020-10-27 NOTE — Patient Instructions (Signed)
Continue with 1/2 tablet daily except 1 tablet each Tuesday,and Thursday.  Repeat INR in 6 weeks

## 2020-10-28 ENCOUNTER — Ambulatory Visit: Payer: HMO

## 2020-11-11 DIAGNOSIS — I5032 Chronic diastolic (congestive) heart failure: Secondary | ICD-10-CM | POA: Diagnosis not present

## 2020-11-11 DIAGNOSIS — I130 Hypertensive heart and chronic kidney disease with heart failure and stage 1 through stage 4 chronic kidney disease, or unspecified chronic kidney disease: Secondary | ICD-10-CM | POA: Diagnosis not present

## 2020-11-11 DIAGNOSIS — E1065 Type 1 diabetes mellitus with hyperglycemia: Secondary | ICD-10-CM | POA: Diagnosis not present

## 2020-11-11 DIAGNOSIS — E1059 Type 1 diabetes mellitus with other circulatory complications: Secondary | ICD-10-CM | POA: Diagnosis not present

## 2020-11-18 DIAGNOSIS — E1065 Type 1 diabetes mellitus with hyperglycemia: Secondary | ICD-10-CM | POA: Diagnosis not present

## 2020-11-18 DIAGNOSIS — E782 Mixed hyperlipidemia: Secondary | ICD-10-CM | POA: Diagnosis not present

## 2020-11-18 DIAGNOSIS — I700 Atherosclerosis of aorta: Secondary | ICD-10-CM | POA: Diagnosis not present

## 2020-12-05 ENCOUNTER — Ambulatory Visit: Payer: HMO | Admitting: Emergency Medicine

## 2020-12-05 ENCOUNTER — Other Ambulatory Visit: Payer: Self-pay

## 2020-12-05 ENCOUNTER — Encounter: Payer: Self-pay | Admitting: Emergency Medicine

## 2020-12-05 DIAGNOSIS — R911 Solitary pulmonary nodule: Secondary | ICD-10-CM

## 2020-12-05 DIAGNOSIS — J449 Chronic obstructive pulmonary disease, unspecified: Secondary | ICD-10-CM | POA: Diagnosis not present

## 2020-12-05 NOTE — Assessment & Plan Note (Signed)
2 mm right lower lobe pulmonary nodule that was stable after 1 year.  Last scan 2019.  Probably benign.  She is concerned and wants to follow for interval change.  I will order 1 more CT scan to confirm stable.  If so then we will not need to do any further.

## 2020-12-05 NOTE — Assessment & Plan Note (Signed)
Probable mild obstruction superimposed on restriction.  She is willing to do a therapeutic trial of albuterol to see if she gets benefit.  If so we could consider starting scheduled bronchodilator therapy at some point in the future.

## 2020-12-05 NOTE — Patient Instructions (Signed)
We will repeat your CT scan of the chest to ensure that your pulmonary nodule is stable. We will do a trial of albuterol to see if you benefit.  Try using 2 puffs if needed for shortness of breath.  You could also try using 2 puffs prior to exertion.  Keep track of whether the medication helps you.  If so then we might consider changing to a longer acting inhaler. Follow Dr. Lamonte Sakai next available after your CT scan so that we can review the results together

## 2020-12-05 NOTE — Progress Notes (Signed)
Subjective:    Patient ID: [name], female    DOB: Oct 14, 1950, 70 y.o.   MRN: [id number]  HPI  ROV 10/09/19 --70 year old woman with a history of CAD/CABG, GERD, hiatal hernia, atrial fibrillation, untreated OSA, diabetes.  She underwent left upper lobe lobectomy in 2010 for stage Ia non-small cell lung cancer, has 2 mm right lower lobe nodule that was stable on serial scanning, last 08/03/2018.  She has restriction, possible mixed obstruction on pulmonary function testing, last 2018. Not on any BD meds. She is somewhat limited, uses a rolling walker, but not by dyspnea. She does not have frequent cough, denies any fevers, sweats or wt loss.   ROV 12/05/20 --[name] is 6 who has a history of left upper lobectomy (2010) for stage I non-small cell lung cancer.  Probable COPD with mixed obstruction and restriction by pulmonary function testing.  Past medical history significant for CAD/CABG, GERD, hiatal hernia, A. fib, diabetes and untreated OSA We have been followed a 2 mm solitary pulmonary nodule in the right lower lobe, stable after 1 year and deemed benign.   She has exertional SOB with walking, no cough, no wheeze. She does have increased HR w exertion. No pred or flares. She doesn't believe she can tolerate CPAP, notes that her OSA was mild.    Review of Systems  Constitutional: Negative for fever and unexpected weight change.  HENT: Negative for congestion, dental problem, ear pain, nosebleeds, postnasal drip, rhinorrhea, sinus pressure, sneezing, sore throat and trouble swallowing.   Eyes: Negative for redness and itching.  Respiratory: Positive for shortness of breath. Negative for cough, chest tightness and wheezing.   Cardiovascular: Positive for palpitations. Negative for leg swelling.  Gastrointestinal: Negative for nausea and vomiting.  Genitourinary: Negative for dysuria.  Musculoskeletal: Negative for joint swelling.  Skin: Negative for rash.  Neurological:  Negative for headaches.  Hematological: Does not bruise/bleed easily.  Psychiatric/Behavioral: Negative for dysphoric mood. The patient is not nervous/anxious.    Past Medical History:  Diagnosis Date  . Arthritis    "knees" (02/05/2016)  . Cancer ([place]) 5/10   LUL Vats   . CHF (congestive heart failure) ([place])   . Coronary artery disease   . GERD (gastroesophageal reflux disease)    tx meds  . H/O hiatal hernia   . History of blood transfusion "several"   last april 2015 s/p knee surgery (02/05/2016)  . Hypercholesteremia   . Hypertension   . Lung cancer ([place])    2010, surgery 18% left lung no radiation or chemo  . Non-STEMI (non-ST elevated myocardial infarction) ([place]) 04/07/2012   See cath results below  . Paroxysmal atrial fibrillation ([place]) 04/07/2012   On Warfarin  . Peripheral vascular disease ([place]) 8/12   Lt SFA PTA  . Presence of stent in LAD coronary artery 04/07/2012   Xience Expedition DES 2.75 mm x 18 mm (dilated to 3.0 mm)  . Sleep apnea    does not wear CPAP  . Type II diabetes mellitus (HCC)    insulin dependent     Family History  Problem Relation Age of Onset  . Hypertension Mother   . Coronary artery disease Brother   . Hypertension Sister      Social History   Socioeconomic History  . Marital status: Single    Spouse name: Not on file  . Number of children: Not on file  . Years of education: Not on file  . Highest education level: Not on file  Occupational History  . Occupation: Retired  Tobacco Use  . Smoking status: Former Smoker    Packs/day: 1.00    Years: 20.00    Pack years: 20.00    Types: Cigarettes    Quit date: 09/27/1984    Years since quitting: 36.2  . Smokeless tobacco: Never Used  Vaping Use  . Vaping Use: Never used  Substance and Sexual Activity  . Alcohol use: No  . Drug use: No  . Sexual activity: Not on file  Other Topics Concern  . Not on file  Social History Narrative   Patient lives alone.   Social Determinants  of Health   Financial Resource Strain: Not on file  Food Insecurity: No Food Insecurity  . Worried About Charity fundraiser in the Last Year: Never true  . Ran Out of Food in the Last Year: Never true  Transportation Needs: No Transportation Needs  . Lack of Transportation (Medical): No  . Lack of Transportation (Non-Medical): No  Physical Activity: Not on file  Stress: Not on file  Social Connections: Not on file  Intimate Partner Violence: Not on file  worked telephone company, the [employer] [place]   Allergies  Allergen Reactions  . Levofloxacin Itching  . Morphine And Related Itching    Pt reports oxycodone history with no allergy.     Outpatient Medications Prior to Visit  Medication Sig Dispense Refill  . furosemide (LASIX) 40 MG tablet Take 40 mg by mouth as needed.    . insulin NPH-regular Human (70-30) 100 UNIT/ML injection Inject into the skin. 60 units in the morning and 20 units at bedtme    . lisinopril-hydrochlorothiazide (PRINZIDE,ZESTORETIC) 20-25 MG per tablet Take 1 tablet by mouth daily.    . metoCLOPramide (REGLAN) 10 MG tablet Take 10 mg by mouth 2 (two) times daily. Per patient    . metoprolol tartrate (LOPRESSOR) 50 MG tablet 50 mg in the morning and 25 mg at bedtime 180 tablet 3  . pantoprazole (PROTONIX) 40 MG tablet Take 1 tablet (40 mg total) by mouth daily. 90 tablet 3  . simvastatin (ZOCOR) 40 MG tablet Take 1 tablet (40 mg total) by mouth daily at 6 PM. 90 tablet 3  . spironolactone (ALDACTONE) 25 MG tablet Take 25 mg by mouth as needed.    . Vitamin D, Cholecalciferol, 1000 units TABS Take 1,000 Units by mouth every morning.     . warfarin (COUMADIN) 7.5 MG tablet TAKE 1/2 TO 1 TABLET DAILY AS DIRECTED BY COUMADIN CLINIC 90 tablet 0   No facility-administered medications prior to visit.        Objective:   Physical Exam Vitals:   12/05/20 1153  BP: 110/74  Pulse: 85  Temp: (!) 97.3 F (36.3 C)  TempSrc: Temporal  SpO2: 100%   Weight: 298 lb 4.8 oz (135.3 kg)  Height: 5\' 3"  (1.6 m)   Gen: Pleasant, obese woman, in no distress,  normal affect, in a wheelchair  ENT: No lesions,  mouth clear,  oropharynx clear, no postnasal drip  Neck: No JVD, no stridor  Lungs: No use of accessory muscles, clear bilaterally  Cardiovascular: RRR, heart sounds normal, no murmur or gallops, no peripheral edema  Musculoskeletal: No deformities, no cyanosis or clubbing  Neuro: alert, non focal  Skin: Warm, no lesions or rash      Assessment & Plan:  Pulmonary nodule/lesion, solitary 2 mm right lower lobe pulmonary nodule that was stable after 1 year.  Last  scan 2019.  Probably benign.  She is concerned and wants to follow for interval change.  I will order 1 more CT scan to confirm stable.  If so then we will not need to do any further.  COPD (chronic obstructive pulmonary disease) (HCC) Probable mild obstruction superimposed on restriction.  She is willing to do a therapeutic trial of albuterol to see if she gets benefit.  If so we could consider starting scheduled bronchodilator therapy at some point in the future.  Baltazar Apo, MD, PhD 12/05/2020, 12:38 PM  Pulmonary and Critical Care [phone] or if no answer [phone]

## 2020-12-12 ENCOUNTER — Ambulatory Visit (INDEPENDENT_AMBULATORY_CARE_PROVIDER_SITE_OTHER): Payer: HMO

## 2020-12-12 ENCOUNTER — Other Ambulatory Visit: Payer: Self-pay

## 2020-12-12 DIAGNOSIS — I480 Paroxysmal atrial fibrillation: Secondary | ICD-10-CM | POA: Diagnosis not present

## 2020-12-12 DIAGNOSIS — Z7901 Long term (current) use of anticoagulants: Secondary | ICD-10-CM | POA: Diagnosis not present

## 2020-12-12 DIAGNOSIS — Z5181 Encounter for therapeutic drug level monitoring: Secondary | ICD-10-CM

## 2020-12-12 LAB — POCT INR: INR: 2.5 (ref 2.0–3.0)

## 2020-12-12 NOTE — Patient Instructions (Signed)
Continue with 1/2 tablet daily except 1 tablet each Tuesday,and Thursday.  Repeat INR in 6 weeks

## 2020-12-18 ENCOUNTER — Other Ambulatory Visit: Payer: Self-pay

## 2020-12-18 ENCOUNTER — Telehealth (INDEPENDENT_AMBULATORY_CARE_PROVIDER_SITE_OTHER): Payer: HMO | Admitting: Cardiovascular Disease

## 2020-12-18 VITALS — BP 113/75 | HR 77 | Ht 63.0 in | Wt 293.0 lb

## 2020-12-18 DIAGNOSIS — I739 Peripheral vascular disease, unspecified: Secondary | ICD-10-CM

## 2020-12-18 DIAGNOSIS — I10 Essential (primary) hypertension: Secondary | ICD-10-CM | POA: Diagnosis not present

## 2020-12-18 DIAGNOSIS — I2510 Atherosclerotic heart disease of native coronary artery without angina pectoris: Secondary | ICD-10-CM | POA: Diagnosis not present

## 2020-12-18 DIAGNOSIS — I5032 Chronic diastolic (congestive) heart failure: Secondary | ICD-10-CM | POA: Diagnosis not present

## 2020-12-18 DIAGNOSIS — Z7901 Long term (current) use of anticoagulants: Secondary | ICD-10-CM

## 2020-12-18 DIAGNOSIS — I480 Paroxysmal atrial fibrillation: Secondary | ICD-10-CM

## 2020-12-18 DIAGNOSIS — E1151 Type 2 diabetes mellitus with diabetic peripheral angiopathy without gangrene: Secondary | ICD-10-CM | POA: Diagnosis not present

## 2020-12-18 MED ORDER — WARFARIN SODIUM 7.5 MG PO TABS: ORAL_TABLET | ORAL | 0 refills | Status: DC

## 2020-12-18 MED ORDER — FUROSEMIDE 40 MG PO TABS: 40.0000 mg | ORAL_TABLET | ORAL | 0 refills | Status: DC | PRN

## 2020-12-18 MED ORDER — SPIRONOLACTONE 25 MG PO TABS: 25.0000 mg | ORAL_TABLET | Freq: Every day | ORAL | 0 refills | Status: DC | PRN

## 2020-12-18 MED ORDER — FUROSEMIDE 40 MG PO TABS: 40.0000 mg | ORAL_TABLET | Freq: Every day | ORAL | 0 refills | Status: DC | PRN

## 2020-12-18 MED ORDER — SPIRONOLACTONE 25 MG PO TABS: 25.0000 mg | ORAL_TABLET | ORAL | 0 refills | Status: DC | PRN

## 2020-12-18 NOTE — Patient Instructions (Signed)

## 2020-12-18 NOTE — Progress Notes (Signed)
Cardiology telemedicine note   Patient Name: [name] Date of Encounter: 12/18/2020  Primary Care Provider:  [name], MD Primary Cardiologist:  Sanda Klein, MD  Patient Profile    [name] is a 70 y.o. female who presents for a follow-up evaluation of her CHF and CAD. We tried unsuccessfully to establish a video link and performed a telephone consultation. Total visit duration 21 minutes.  Past Medical History    Past Medical History:  Diagnosis Date  . Arthritis    "knees" (02/05/2016)  . Cancer ([place]) 5/10   LUL Vats   . CHF (congestive heart failure) ([place])   . Coronary artery disease   . GERD (gastroesophageal reflux disease)    tx meds  . H/O hiatal hernia   . History of blood transfusion "several"   last april 2015 s/p knee surgery (02/05/2016)  . Hypercholesteremia   . Hypertension   . Lung cancer ([place])    2010, surgery 18% left lung no radiation or chemo  . Non-STEMI (non-ST elevated myocardial infarction) ([place]) 04/07/2012   See cath results below  . Paroxysmal atrial fibrillation ([place]) 04/07/2012   On Warfarin  . Peripheral vascular disease ([place]) 8/12   Lt SFA PTA  . Presence of stent in LAD coronary artery 04/07/2012   Xience Expedition DES 2.75 mm x 18 mm (dilated to 3.0 mm)  . Sleep apnea    does not wear CPAP  . Type II diabetes mellitus (HCC)    insulin dependent   Past Surgical History:  Procedure Laterality Date  . ABDOMINAL HYSTERECTOMY  1990  . BREAST EXCISIONAL BIOPSY Right   . CARDIAC CATHETERIZATION  11/04/2008   patent RCA, LM, and Circ, nl EF  . CARDIAC CATHETERIZATION  02/20/2002   patent coronaries with the only abnormality being a smooth luminla irregularity in the mid intermediate ramus branch no felt to be hemodynamically significant, nl LV  . CARDIAC CATHETERIZATION N/A 02/05/2016   Procedure: Left Heart Cath and Coronary Angiography;  Surgeon: Leonie Man, MD;  Location: [facility];  Service:  Cardiovascular;  Laterality: N/A;  . CARDIAC CATHETERIZATION N/A 02/05/2016   Procedure: Coronary Stent Intervention;  Surgeon: Leonie Man, MD;  Location: [facility];  Service: Cardiovascular;  Laterality: N/A;  . CATARACT EXTRACTION W/ INTRAOCULAR LENS  IMPLANT, BILATERAL  2013  . CORONARY ANGIOPLASTY WITH STENT PLACEMENT  04/07/2012   Xience Expedition DES 2.10mm x 18 mm (dilated to 3.0 mm) to the prox LAD   . CORONARY ANGIOPLASTY WITH STENT PLACEMENT  2013; 2015  . CORONARY ARTERY BYPASS GRAFT N/A 08/17/2017   Procedure: CORONARY ARTERY BYPASS GRAFTING (CABG) TIMES 1 USING LEFT INTERNAL MAMMARY ARTERY;  Surgeon: Melrose Nakayama, MD;  Location: [facility];  Service: Open Heart Surgery;  Laterality: N/A;  . DILATION AND CURETTAGE OF UTERUS  <1990  . ESOPHAGOGASTRODUODENOSCOPY N/A 05/23/2015   Procedure: ESOPHAGOGASTRODUODENOSCOPY (EGD);  Surgeon: Carol Ada, MD;  Location: [facility];  Service: Endoscopy;  Laterality: N/A;  . ESOPHAGOGASTRODUODENOSCOPY (EGD) WITH PROPOFOL N/A 06/13/2015   Procedure: ESOPHAGOGASTRODUODENOSCOPY (EGD) WITH PROPOFOL;  Surgeon: Carol Ada, MD;  Location: [facility];  Service: Endoscopy;  Laterality: N/A;  . ESOPHAGOGASTRODUODENOSCOPY (EGD) WITH PROPOFOL N/A 04/07/2018   Procedure: ESOPHAGOGASTRODUODENOSCOPY (EGD) WITH PROPOFOL;  Surgeon: Carol Ada, MD;  [facility];  Service: Endoscopy;  Laterality: N/A;  fluoroscopy is needed  . FRACTURE SURGERY    . HAMMER TOE SURGERY Bilateral 1993   with screws, on  screw removed  . JOINT REPLACEMENT    . KNEE ARTHROSCOPY Bilateral    left x2, right x1  . LAPAROSCOPIC CHOLECYSTECTOMY    . LEFT HEART CATH AND CORONARY ANGIOGRAPHY N/A 08/11/2017   Procedure: LEFT HEART CATH AND CORONARY ANGIOGRAPHY;  Surgeon: Martinique, Peter M, MD;  Location: [facility];  Service: Cardiovascular;  Laterality: N/A;  . LEFT HEART CATHETERIZATION WITH CORONARY ANGIOGRAM N/A 04/07/2012   Procedure: LEFT HEART  CATHETERIZATION WITH CORONARY ANGIOGRAM;  Surgeon: [name], MD;  Location: [facility] C[facility];  Service: Cardiovascular;  Laterality: N/A;  . LEFT HEART CATHETERIZATION WITH CORONARY ANGIOGRAM N/A 05/27/2014   Procedure: LEFT HEART CATHETERIZATION WITH CORONARY ANGIOGRAM;  Surgeon: [name], MD; LAD 99% ISR, CFX 50-60%, RCA (dominant) no sig dz, EF 60%  . LOWER EXTREMITY ANGIOGRAM  05/17/11   directional atherectomy to the prox L SFA using a LX Man TurboHawk, ballooned with a [employer] balloon   . ORIF ANKLE FRACTURE  07/09/2012   Procedure: OPEN REDUCTION INTERNAL FIXATION (ORIF) ANKLE FRACTURE;  Surgeon: Meredith Pel, MD;  [facility];  Service: Orthopedics;  Laterality: Left;  open reduction internal fixation trimalleolar ankle fracture medial malleolous fixation  . PERCUTANEOUS CORONARY STENT INTERVENTION (PCI-S) N/A 04/07/2012   Procedure: PERCUTANEOUS CORONARY STENT INTERVENTION (PCI-S);  Surgeon: [name], MD;  Location: [facility] CATH LAB;  Service: Cardiovascular;  Laterality: N/A;  . PERCUTANEOUS CORONARY STENT INTERVENTION (PCI-S)  05/27/2014   Procedure: PERCUTANEOUS CORONARY STENT INTERVENTION (PCI-S);  Surgeon: [name], MD; 3 mm x 12 mm long Xience Xpedition DES to the proximal LAD  . SAVORY DILATION N/A 06/13/2015   Procedure: SAVORY DILATION;  Surgeon: Carol Ada, MD;  [facility];  Service: Endoscopy;  Laterality: N/A;  . SAVORY DILATION N/A 04/07/2018   Procedure: SAVORY DILATION;  Surgeon: Carol Ada, MD;  [facility];  Service: Endoscopy;  Laterality: N/A;  . TEE WITHOUT CARDIOVERSION N/A 08/17/2017   Procedure: TRANSESOPHAGEAL ECHOCARDIOGRAM (TEE);  Surgeon: Melrose Nakayama, MD;  Location: [facility];  Service: Open Heart Surgery;  Laterality: N/A;  . TONSILLECTOMY    . TOTAL KNEE ARTHROPLASTY Left 2003  . TOTAL KNEE REVISION Left 11/05/2013   Procedure: LEFT TOTAL KNEE RESECTION;  Surgeon: Mauri Pole, MD;  [facility];  Service: Orthopedics;  Laterality: Left;  . TOTAL KNEE REVISION Left 2007   opened in 2006 and cleaned out, 2007 revision  . TOTAL KNEE REVISION Left 12/31/2013   Procedure: RE-INPLANTATION LEFT TOTAL KNEE ;  Surgeon: Mauri Pole, MD;  [facility];  Service: Orthopedics;  Laterality: Left;  Marland Kitchen VIDEO ASSISTED THORACOSCOPY (VATS)/ LOBECTOMY  01/2009    Allergies  Allergies  Allergen Reactions  . Levofloxacin Itching  . Morphine And Related Itching    Pt reports oxycodone history with no allergy.    History of Present Illness    Ms. Eutsler PMH of CABG (LIMA-LAD 11/18), PAD, chronic diastolic CHF, and paroxysmal atrial fibrillation.  Her PMH also includes type 2 diabetes on insulin, and morbid obesity.  She underwent CABG November 2018 for recurrent in-stent restenosis with multiple PCI.  (PCI with DES to LAD 2013, 2015 DES to distal LAD, 4/17 received DES in proximal LAD, 11/18 again with 3 stenosis and was referred for single-vessel CABG).  She is also status post left superficial femoral artery atherectomy.    She was last seen in our office she has been doing generally well.  Her  weight has been very stable at around 290 pounds and she rarely has to take diuretics.  Her most recent labs showed improved lipid profile with LDL of 46 and an HDL of 46.  Glucose control is a little better to come with hemoglobin A1c decreasing from 9.2% down to 8.7%.  Activity continues to be limited by her serious knee problems.  Even with this she does develop dyspnea exertion when walking in her house.  She recently saw a pulmonary specialist who prescribed albuterol, but she is still waiting to hear from insurance whether this will be approved but has not started it.  She is morbidly obese.  She does not have orthopnea, PND or significant lower extremity edema at this time (she always has a little bit of swelling in one ankle that has previously been injured).  She does not have chest  discomfort at rest or with activity.  She denies dizziness, palpitations or syncope.  She is compliant with warfarin anticoagulation and prothrombin time monitoring and has not had falls, injuries or serious bleeding complications.   Home Medications    Prior to Admission medications   Medication Sig Start Date End Date Taking? Authorizing Provider  furosemide (LASIX) 40 MG tablet Take 40 mg by mouth as needed.    [provider]  insulin NPH-regular Human (70-30) 100 UNIT/ML injection Inject into the skin. 60 units in the morning and 20 units at bedtme    [provider]  lisinopril-hydrochlorothiazide (PRINZIDE,ZESTORETIC) 20-25 MG per tablet Take 1 tablet by mouth daily.    [provider]  metoCLOPramide (REGLAN) 10 MG tablet Take 10 mg by mouth 2 (two) times daily. Per patient    [provider]  metoprolol tartrate (LOPRESSOR) 50 MG tablet 50 mg in the morning and 25 mg at bedtime    [provider]  pantoprazole (PROTONIX) 40 MG tablet Take 1 tablet (40 mg total) by mouth daily. 06/13/20   Croitoru, Mihai, MD  simvastatin (ZOCOR) 40 MG tablet Take 1 tablet (40 mg total) by mouth daily at 6 PM. 04/01/20   Croitoru, Mihai, MD  spironolactone (ALDACTONE) 25 MG tablet Take 25 mg by mouth as needed.    [provider]  Vitamin D, Cholecalciferol, 1000 units TABS Take 1,000 Units by mouth every morning.     [provider]  warfarin (COUMADIN) 7.5 MG tablet TAKE 1/2 TO 1 TABLET DAILY AS DIRECTED BY COUMADIN CLINIC 06/13/20   Croitoru, Dani Gobble, MD    Family History    Family History  Problem Relation Age of Onset  . Hypertension Mother   . Coronary artery disease Brother   . Hypertension Sister    She indicated that her mother is deceased. She indicated that her father is deceased. She indicated that the status of her sister is unknown. She indicated that the status of her brother is unknown.  Social History    Social History    Socioeconomic History  . Marital status: Single    Spouse name: Not on file  . Number of children: Not on file  . Years of education: Not on file  . Highest education level: Not on file  Occupational History  . Occupation: Retired  Tobacco Use  . Smoking status: Former Smoker    Packs/day: 1.00    Years: 20.00    Pack years: 20.00    Types: Cigarettes    Quit date: 09/27/1984    Years since quitting: 36.2  . Smokeless tobacco: Never Used  Vaping Use  . Vaping Use: Never used  Substance and Sexual Activity  . Alcohol use: No  . Drug use: No  . Sexual activity: Not on file  Other Topics Concern  . Not on file  Social History Narrative   Patient lives alone.   Social Determinants of Health   Financial Resource Strain: Not on file  Food Insecurity: No Food Insecurity  . Worried About Charity fundraiser in the Last Year: Never true  . Ran Out of Food in the Last Year: Never true  Transportation Needs: No Transportation Needs  . Lack of Transportation (Medical): No  . Lack of Transportation (Non-Medical): No  Physical Activity: Not on file  Stress: Not on file  Social Connections: Not on file  Intimate Partner Violence: Not on file     Review of Systems    General:  No chills, fever, night sweats or weight changes.  Cardiovascular:  No chest pain, dyspnea on exertion, edema, orthopnea, palpitations, paroxysmal nocturnal dyspnea. Dermatological: No rash, lesions/masses Respiratory: No cough, dyspnea Urologic: No hematuria, dysuria Abdominal:   No nausea, vomiting, diarrhea, bright red blood per rectum, melena, or hematemesis Neurologic:  No visual changes, wkns, changes in mental status. All other systems reviewed and are otherwise negative except as noted above.  Physical Exam    VS:  BP 113/75   Pulse 77   Ht 5\' 3"  (1.6 m)   Wt 293 lb (132.9 kg)   BMI 51.90 kg/m  , BMI Body mass index is 51.9 kg/m.  Vital signs reviewed unable to examine. Accessory  Clinical Findings    Recent Labs: No results found for requested labs within last 8760 hours.   Labs from Dr. Ashby Dawes from 11/11/2020 Hemoglobin 12.0, creatinine 1.23 (GFR 44), hemoglobin A1c 8.7%, normal liver function tests except for mildly elevated alkaline phosphatase Total cholesterol 107, HDL 46, LDL 46, triglycerides 70  Recent Lipid Panel    Component Value Date/Time   CHOL 133 08/10/2017 1141   TRIG 78 08/10/2017 1141   HDL 46 08/10/2017 1141   CHOLHDL 2.9 08/10/2017 1141   VLDL 16 08/10/2017 1141   LDLCALC 71 08/10/2017 1141   LDLDIRECT 143 (H) 10/10/2008 2042    ECG personally reviewed by me today-none today.  EKG 03/28/2020 Atrial flutter with 2 1 AV block 84 bpm  Echocardiogram 08/15/2017 Study Conclusions   - Left ventricle: The cavity size was normal. Systolic function was  normal. The estimated ejection fraction was in the range of 55%  to 60%. Wall motion was normal; there were no regional wall  motion abnormalities. Features are consistent with a pseudonormal  left ventricular filling pattern, with concomitant abnormal  relaxation and increased filling pressure (grade 2 diastolic  dysfunction).  - Left atrium: The atrium was mildly dilated.   Assessment & Plan    1. Chronic diastolic CHF: As far as I can tell based on a phone conversation she is euvolemic and her weight is unchanged from her baseline.  Instructed her to use her diuretic prescription as needed if her weight exceeds 295 pounds. 2.  Paroxysmal/persistent AFib: Sometimes presents with an atypical atrial flutter with variable degrees of AV block, seems to have good ventricular rate control.  CHA2DS2-VASc 6 (age, gender, diabetes, heart failure, CAD/PAD, hypertension) 3.  Anticoagulation: INR is consistently within normal range at home over the last 12 months. 4. Coronary artery disease status post CABG: Asymptomatic.  Other than hyperglycemia and superobesity, her risk factors  all appear  to be reasonably well controlled. 5. DM: Recent improvement in glycemic control, but not yet at goal. 6. PAD: Denies claudication but this could well be masked by her sedentary lifestyle. 7.  Essential hypertension: Good blood pressure control, no changes in medications.  Patient Instructions  Medication Instructions:  No changes *If you need a refill on your cardiac medications before your next appointment, please call your pharmacy*   Lab Work: None ordered If you have labs (blood work) drawn today and your tests are completely normal, you will receive your results only by: Marland Kitchen MyChart Message (if you have MyChart) OR . A paper copy in the mail If you have any lab test that is abnormal or we need to change your treatment, we will call you to review the results.   Testing/Procedures: None ordered   Follow-Up: At [facility], you and your health needs are our priority.  As part of our continuing mission to provide you with exceptional heart care, we have created designated Provider Care Teams.  These Care Teams include your primary Cardiologist (physician) and Advanced Practice Providers (APPs -  Physician Assistants and Nurse Practitioners) who all work together to provide you with the care you need, when you need it.  We recommend signing up for the patient portal called "MyChart".  Sign up information is provided on this After Visit Summary.  MyChart is used to connect with patients for Virtual Visits (Telemedicine).  Patients are able to view lab/test results, encounter notes, upcoming appointments, etc.  Non-urgent messages can be sent to your provider as well.   To learn more about what you can do with MyChart, go to [email or web address].    Your next appointment:   12 month(s)  The format for your next appointment:   In Person  Provider:   You may see Sanda Klein, MD or one of the following Advanced Practice Providers on your designated Care Team:    Almyra Deforest, PA-C  Fabian Sharp, PA-C or   Roby Lofts, [place]

## 2020-12-19 ENCOUNTER — Telehealth: Payer: Self-pay | Admitting: Emergency Medicine

## 2020-12-19 DIAGNOSIS — R911 Solitary pulmonary nodule: Secondary | ICD-10-CM

## 2020-12-19 NOTE — Telephone Encounter (Signed)
At 12/05/20 OV pt was advised that we would be scheduling a follow up CT chest.  This CT chest was never ordered.. New order placed for CT chest. Routing to [facility] for scheduling.  Thanks!

## 2020-12-19 NOTE — Telephone Encounter (Signed)
Pt aware of appt.

## 2020-12-29 ENCOUNTER — Other Ambulatory Visit: Payer: Self-pay

## 2020-12-29 ENCOUNTER — Ambulatory Visit (INDEPENDENT_AMBULATORY_CARE_PROVIDER_SITE_OTHER)
Admission: RE | Admit: 2020-12-29 | Discharge: 2020-12-29 | Disposition: A | Discharge status: Home / Self Care | Payer: HMO | Source: Ambulatory Visit | Attending: Emergency Medicine | Admitting: Emergency Medicine

## 2020-12-29 DIAGNOSIS — R911 Solitary pulmonary nodule: Secondary | ICD-10-CM

## 2020-12-29 DIAGNOSIS — C3490 Malignant neoplasm of unspecified part of unspecified bronchus or lung: Secondary | ICD-10-CM | POA: Diagnosis not present

## 2020-12-29 DIAGNOSIS — R918 Other nonspecific abnormal finding of lung field: Secondary | ICD-10-CM | POA: Diagnosis not present

## 2020-12-29 DIAGNOSIS — J929 Pleural plaque without asbestos: Secondary | ICD-10-CM | POA: Diagnosis not present

## 2021-01-01 DIAGNOSIS — Z86010 Personal history of colonic polyps: Secondary | ICD-10-CM | POA: Diagnosis not present

## 2021-01-01 DIAGNOSIS — K219 Gastro-esophageal reflux disease without esophagitis: Secondary | ICD-10-CM | POA: Diagnosis not present

## 2021-01-01 DIAGNOSIS — K3184 Gastroparesis: Secondary | ICD-10-CM | POA: Diagnosis not present

## 2021-01-19 ENCOUNTER — Telehealth: Payer: Self-pay | Admitting: Emergency Medicine

## 2021-01-19 DIAGNOSIS — R911 Solitary pulmonary nodule: Secondary | ICD-10-CM

## 2021-01-19 NOTE — Telephone Encounter (Signed)
Called and spoke with patient. She verbalized understanding of results. CT scan has been ordered. Recall placed. Nothing further needed at time of call.

## 2021-01-19 NOTE — Telephone Encounter (Signed)
Please let her know that I reviewed her CT. No significant change in her previously identified pulmonary nodule. There was a new small nodule seen that will need to be followed. She needs a repeat Ct chest in 6 months (super D CT chest) and then an OV to review .

## 2021-01-19 NOTE — Telephone Encounter (Signed)
Pt is calling about her CT results:   RB please advise. Thanks     CLINICAL DATA:  Follow-up pulmonary nodule. Short of breath. History of lung cancer diagnosed 2010 with LEFT lobectomy.  EXAM: CT CHEST WITHOUT CONTRAST  TECHNIQUE: Multidetector CT imaging of the chest was performed following the standard protocol without IV contrast.  COMPARISON:  CT 08/03/2018  FINDINGS: Cardiovascular: Post CABG  Mediastinum/Nodes: No axillary or supraclavicular adenopathy. No mediastinal or hilar adenopathy. No pericardial fluid. Esophagus normal.  Lungs/Pleura: Small nodule identified in the RIGHT lower lobe on comparison CT measures 3 mm (image 71/3). This is slightly larger than 2 mm on comparison exam however this may relate to slice selection of this very small nodule.  3 mm nodule in the RIGHT peripheral upper lobe (image 31/3) is not clearly identified on comparison exam.  Postsurgical change consistent with LEFT lower lobectomy. Expected mild pleural thickening following surgery. No suspicious nodularity.  Upper Abdomen: Limited view of the liver, kidneys, pancreas are unremarkable. Normal adrenal glands.  Musculoskeletal: No aggressive osseous lesion.  IMPRESSION: 1. Essentially stable small RIGHT lobe pulmonary nodule compared to CT 08/03/2018. 2. New small 3 mm nodule in the RIGHT upper lobe. Recommend follow-up CT in 3-6 months in patient with prior lung cancer. 3. Stable postsurgical change in the LEFT hemithorax.

## 2021-01-21 ENCOUNTER — Ambulatory Visit (INDEPENDENT_AMBULATORY_CARE_PROVIDER_SITE_OTHER): Payer: HMO

## 2021-01-21 ENCOUNTER — Other Ambulatory Visit: Payer: Self-pay

## 2021-01-21 DIAGNOSIS — Z5181 Encounter for therapeutic drug level monitoring: Secondary | ICD-10-CM

## 2021-01-21 DIAGNOSIS — I480 Paroxysmal atrial fibrillation: Secondary | ICD-10-CM | POA: Diagnosis not present

## 2021-01-21 DIAGNOSIS — Z7901 Long term (current) use of anticoagulants: Secondary | ICD-10-CM

## 2021-01-21 LAB — POCT INR: INR: 2 (ref 2.0–3.0)

## 2021-01-21 NOTE — Patient Instructions (Signed)
Continue with 1/2 tablet daily except 1 tablet each Tuesday and Thursday.  Repeat INR in 6 weeks

## 2021-01-24 DIAGNOSIS — N183 Chronic kidney disease, stage 3 unspecified: Secondary | ICD-10-CM | POA: Diagnosis not present

## 2021-01-24 DIAGNOSIS — I130 Hypertensive heart and chronic kidney disease with heart failure and stage 1 through stage 4 chronic kidney disease, or unspecified chronic kidney disease: Secondary | ICD-10-CM | POA: Diagnosis not present

## 2021-01-24 DIAGNOSIS — I5032 Chronic diastolic (congestive) heart failure: Secondary | ICD-10-CM | POA: Diagnosis not present

## 2021-01-24 DIAGNOSIS — E1122 Type 2 diabetes mellitus with diabetic chronic kidney disease: Secondary | ICD-10-CM | POA: Diagnosis not present

## 2021-02-24 DIAGNOSIS — E782 Mixed hyperlipidemia: Secondary | ICD-10-CM | POA: Diagnosis not present

## 2021-02-24 DIAGNOSIS — I700 Atherosclerosis of aorta: Secondary | ICD-10-CM | POA: Diagnosis not present

## 2021-02-24 DIAGNOSIS — I4821 Permanent atrial fibrillation: Secondary | ICD-10-CM | POA: Diagnosis not present

## 2021-02-24 DIAGNOSIS — N1831 Chronic kidney disease, stage 3a: Secondary | ICD-10-CM | POA: Diagnosis not present

## 2021-02-24 DIAGNOSIS — E1065 Type 1 diabetes mellitus with hyperglycemia: Secondary | ICD-10-CM | POA: Diagnosis not present

## 2021-03-04 ENCOUNTER — Other Ambulatory Visit: Payer: Self-pay

## 2021-03-04 ENCOUNTER — Ambulatory Visit (INDEPENDENT_AMBULATORY_CARE_PROVIDER_SITE_OTHER): Payer: HMO

## 2021-03-04 DIAGNOSIS — Z7901 Long term (current) use of anticoagulants: Secondary | ICD-10-CM

## 2021-03-04 DIAGNOSIS — I480 Paroxysmal atrial fibrillation: Secondary | ICD-10-CM | POA: Diagnosis not present

## 2021-03-04 DIAGNOSIS — Z5181 Encounter for therapeutic drug level monitoring: Secondary | ICD-10-CM

## 2021-03-04 LAB — POCT INR: INR: 1.7 — AB (ref 2.0–3.0)

## 2021-03-04 NOTE — Patient Instructions (Signed)
Take 1.5 tablets today only and then Continue with 1/2 tablet daily except 1 tablet each Tuesday and Thursday.  Repeat INR in 6 weeks

## 2021-03-26 DIAGNOSIS — E1122 Type 2 diabetes mellitus with diabetic chronic kidney disease: Secondary | ICD-10-CM | POA: Diagnosis not present

## 2021-03-26 DIAGNOSIS — I130 Hypertensive heart and chronic kidney disease with heart failure and stage 1 through stage 4 chronic kidney disease, or unspecified chronic kidney disease: Secondary | ICD-10-CM | POA: Diagnosis not present

## 2021-03-26 DIAGNOSIS — N183 Chronic kidney disease, stage 3 unspecified: Secondary | ICD-10-CM | POA: Diagnosis not present

## 2021-03-26 DIAGNOSIS — I5032 Chronic diastolic (congestive) heart failure: Secondary | ICD-10-CM | POA: Diagnosis not present

## 2021-04-03 ENCOUNTER — Other Ambulatory Visit: Payer: Self-pay | Admitting: Internal Medicine

## 2021-04-03 DIAGNOSIS — Z1231 Encounter for screening mammogram for malignant neoplasm of breast: Secondary | ICD-10-CM

## 2021-04-15 ENCOUNTER — Other Ambulatory Visit: Payer: Self-pay

## 2021-04-15 ENCOUNTER — Ambulatory Visit (INDEPENDENT_AMBULATORY_CARE_PROVIDER_SITE_OTHER): Payer: HMO

## 2021-04-15 DIAGNOSIS — I480 Paroxysmal atrial fibrillation: Secondary | ICD-10-CM | POA: Diagnosis not present

## 2021-04-15 DIAGNOSIS — Z7901 Long term (current) use of anticoagulants: Secondary | ICD-10-CM

## 2021-04-15 DIAGNOSIS — Z5181 Encounter for therapeutic drug level monitoring: Secondary | ICD-10-CM | POA: Diagnosis not present

## 2021-04-15 LAB — POCT INR: INR: 2 (ref 2.0–3.0)

## 2021-04-15 NOTE — Patient Instructions (Addendum)
Continue with 1/2 tablet daily except 1 tablet each Tuesday and Thursday.  Repeat INR in 5 weeks

## 2021-05-04 ENCOUNTER — Telehealth: Payer: Self-pay | Admitting: Emergency Medicine

## 2021-05-05 NOTE — Telephone Encounter (Signed)
I checked pt's chart and RB put in order for CT to be done in Oct.  I called pt & made her aware we will be calling her next month to set up CT for Oct & will make her appt with RB.  She stated ok.  Nothing further needed.

## 2021-05-07 DIAGNOSIS — L03116 Cellulitis of left lower limb: Secondary | ICD-10-CM | POA: Diagnosis not present

## 2021-05-08 DIAGNOSIS — M722 Plantar fascial fibromatosis: Secondary | ICD-10-CM | POA: Diagnosis not present

## 2021-05-08 DIAGNOSIS — M2041 Other hammer toe(s) (acquired), right foot: Secondary | ICD-10-CM | POA: Diagnosis not present

## 2021-05-08 DIAGNOSIS — M71572 Other bursitis, not elsewhere classified, left ankle and foot: Secondary | ICD-10-CM | POA: Diagnosis not present

## 2021-05-08 DIAGNOSIS — M71571 Other bursitis, not elsewhere classified, right ankle and foot: Secondary | ICD-10-CM | POA: Diagnosis not present

## 2021-05-08 DIAGNOSIS — M79671 Pain in right foot: Secondary | ICD-10-CM | POA: Diagnosis not present

## 2021-05-08 DIAGNOSIS — L603 Nail dystrophy: Secondary | ICD-10-CM | POA: Diagnosis not present

## 2021-05-08 DIAGNOSIS — M79672 Pain in left foot: Secondary | ICD-10-CM | POA: Diagnosis not present

## 2021-05-08 DIAGNOSIS — I739 Peripheral vascular disease, unspecified: Secondary | ICD-10-CM | POA: Diagnosis not present

## 2021-05-08 DIAGNOSIS — E1151 Type 2 diabetes mellitus with diabetic peripheral angiopathy without gangrene: Secondary | ICD-10-CM | POA: Diagnosis not present

## 2021-05-12 ENCOUNTER — Other Ambulatory Visit: Payer: Self-pay

## 2021-05-12 MED ORDER — METOPROLOL TARTRATE 50 MG PO TABS: ORAL_TABLET | ORAL | 3 refills | Status: DC

## 2021-05-13 DIAGNOSIS — L03116 Cellulitis of left lower limb: Secondary | ICD-10-CM | POA: Diagnosis not present

## 2021-05-13 DIAGNOSIS — I130 Hypertensive heart and chronic kidney disease with heart failure and stage 1 through stage 4 chronic kidney disease, or unspecified chronic kidney disease: Secondary | ICD-10-CM | POA: Diagnosis not present

## 2021-05-13 DIAGNOSIS — R600 Localized edema: Secondary | ICD-10-CM | POA: Diagnosis not present

## 2021-05-13 DIAGNOSIS — N1831 Chronic kidney disease, stage 3a: Secondary | ICD-10-CM | POA: Diagnosis not present

## 2021-05-13 DIAGNOSIS — E1065 Type 1 diabetes mellitus with hyperglycemia: Secondary | ICD-10-CM | POA: Diagnosis not present

## 2021-05-22 DIAGNOSIS — M7661 Achilles tendinitis, right leg: Secondary | ICD-10-CM | POA: Diagnosis not present

## 2021-05-22 DIAGNOSIS — M722 Plantar fascial fibromatosis: Secondary | ICD-10-CM | POA: Diagnosis not present

## 2021-05-26 ENCOUNTER — Other Ambulatory Visit: Payer: Self-pay

## 2021-05-26 MED ORDER — WARFARIN SODIUM 7.5 MG PO TABS: ORAL_TABLET | ORAL | 0 refills | Status: DC

## 2021-05-27 ENCOUNTER — Ambulatory Visit: Payer: HMO

## 2021-05-27 ENCOUNTER — Telehealth: Payer: Self-pay | Admitting: Cardiovascular Disease

## 2021-05-27 DIAGNOSIS — I5032 Chronic diastolic (congestive) heart failure: Secondary | ICD-10-CM | POA: Diagnosis not present

## 2021-05-27 DIAGNOSIS — I130 Hypertensive heart and chronic kidney disease with heart failure and stage 1 through stage 4 chronic kidney disease, or unspecified chronic kidney disease: Secondary | ICD-10-CM | POA: Diagnosis not present

## 2021-05-27 DIAGNOSIS — N183 Chronic kidney disease, stage 3 unspecified: Secondary | ICD-10-CM | POA: Diagnosis not present

## 2021-05-27 DIAGNOSIS — E1122 Type 2 diabetes mellitus with diabetic chronic kidney disease: Secondary | ICD-10-CM | POA: Diagnosis not present

## 2021-05-27 MED ORDER — SIMVASTATIN 40 MG PO TABS: 40.0000 mg | ORAL_TABLET | Freq: Every day | ORAL | 0 refills | Status: DC

## 2021-05-27 NOTE — Telephone Encounter (Signed)
*  STAT* If patient is at the pharmacy, call can be transferred to refill team.   1. Which medications need to be refilled? (please list name of each medication and dose if known) simvastatin (ZOCOR) 40 MG tablet  2. Which pharmacy/location (including street and city if local pharmacy) is medication to be sent to? [facility] - [place], [address]  3. Do they need a 30 day or 90 day supply? 90ds

## 2021-06-09 DIAGNOSIS — I130 Hypertensive heart and chronic kidney disease with heart failure and stage 1 through stage 4 chronic kidney disease, or unspecified chronic kidney disease: Secondary | ICD-10-CM | POA: Diagnosis not present

## 2021-06-09 DIAGNOSIS — E782 Mixed hyperlipidemia: Secondary | ICD-10-CM | POA: Diagnosis not present

## 2021-06-09 DIAGNOSIS — I5032 Chronic diastolic (congestive) heart failure: Secondary | ICD-10-CM | POA: Diagnosis not present

## 2021-06-09 DIAGNOSIS — N1831 Chronic kidney disease, stage 3a: Secondary | ICD-10-CM | POA: Diagnosis not present

## 2021-06-09 DIAGNOSIS — R600 Localized edema: Secondary | ICD-10-CM | POA: Diagnosis not present

## 2021-06-09 DIAGNOSIS — E1065 Type 1 diabetes mellitus with hyperglycemia: Secondary | ICD-10-CM | POA: Diagnosis not present

## 2021-06-09 DIAGNOSIS — I2510 Atherosclerotic heart disease of native coronary artery without angina pectoris: Secondary | ICD-10-CM | POA: Diagnosis not present

## 2021-06-10 ENCOUNTER — Other Ambulatory Visit: Payer: Self-pay

## 2021-06-10 ENCOUNTER — Ambulatory Visit: Payer: HMO

## 2021-06-10 DIAGNOSIS — I480 Paroxysmal atrial fibrillation: Secondary | ICD-10-CM | POA: Diagnosis not present

## 2021-06-10 DIAGNOSIS — Z7901 Long term (current) use of anticoagulants: Secondary | ICD-10-CM

## 2021-06-10 DIAGNOSIS — Z5181 Encounter for therapeutic drug level monitoring: Secondary | ICD-10-CM | POA: Diagnosis not present

## 2021-06-10 LAB — POCT INR: INR: 1.9 — AB (ref 2.0–3.0)

## 2021-06-10 NOTE — Patient Instructions (Signed)
Take 1 tablet tonight only and then Continue with 1/2 tablet daily except 1 tablet each Tuesday and Thursday.  Repeat INR in 4 weeks

## 2021-06-17 ENCOUNTER — Telehealth: Payer: Self-pay | Admitting: Cardiovascular Disease

## 2021-06-17 MED ORDER — PANTOPRAZOLE SODIUM 40 MG PO TBEC: 40.0000 mg | DELAYED_RELEASE_TABLET | Freq: Every day | ORAL | 3 refills | Status: DC

## 2021-06-17 NOTE — Telephone Encounter (Signed)
Refills has been sent to the pharmacy. 

## 2021-06-17 NOTE — Telephone Encounter (Signed)
*  STAT* If patient is at the pharmacy, call can be transferred to refill team.   1. Which medications need to be refilled? (please list name of each medication and dose if known) pantoprazole (PROTONIX) 40 MG tablet  2. Which pharmacy/location (including street and city if local pharmacy) is medication to be sent to? [facility] - [place], [place]  3. Do they need a 30 day or 90 day supply? 90 day   Patient has 2 tablets left.

## 2021-06-24 ENCOUNTER — Ambulatory Visit: Payer: HMO

## 2021-06-29 ENCOUNTER — Ambulatory Visit: Payer: HMO

## 2021-07-03 NOTE — Telephone Encounter (Signed)
*  STAT* If patient is at the pharmacy, call can be transferred to refill team.   1. Which medications need to be refilled? (please list name of each medication and dose if known)  simvastatin (ZOCOR) 40 MG tablet  2. Which pharmacy/location (including street and city if local pharmacy) is medication to be sent to?  [id number] - [place], [place] - [place]  3. Do they need a 30 day or 90 day supply? 90 day supply

## 2021-07-07 ENCOUNTER — Ambulatory Visit
Admission: RE | Admit: 2021-07-07 | Discharge: 2021-07-07 | Disposition: A | Discharge status: Home / Self Care | Payer: HMO | Source: Ambulatory Visit | Attending: Emergency Medicine | Admitting: Emergency Medicine

## 2021-07-07 ENCOUNTER — Telehealth: Payer: Self-pay | Admitting: Cardiovascular Disease

## 2021-07-07 DIAGNOSIS — I700 Atherosclerosis of aorta: Secondary | ICD-10-CM | POA: Diagnosis not present

## 2021-07-07 DIAGNOSIS — R911 Solitary pulmonary nodule: Secondary | ICD-10-CM | POA: Diagnosis not present

## 2021-07-07 DIAGNOSIS — R918 Other nonspecific abnormal finding of lung field: Secondary | ICD-10-CM | POA: Diagnosis not present

## 2021-07-07 NOTE — Telephone Encounter (Signed)
*  STAT* If patient is at the pharmacy, call can be transferred to refill team.   1. Which medications need to be refilled? (please list name of each medication and dose if known) new prescription to local pharmacy for Simvastatin  2. Which pharmacy/location (including street and city if local pharmacy) is medication to be sent to? [facility], [place]  3. Do they need a 30 day or 90 day supply? 90 days and refills

## 2021-07-09 ENCOUNTER — Encounter: Payer: Self-pay | Admitting: Emergency Medicine

## 2021-07-09 ENCOUNTER — Ambulatory Visit: Payer: HMO | Admitting: Emergency Medicine

## 2021-07-09 ENCOUNTER — Other Ambulatory Visit: Payer: Self-pay

## 2021-07-09 VITALS — BP 142/67 | HR 82 | Temp 98.6°F | Resp 16 | Ht 63.0 in

## 2021-07-09 DIAGNOSIS — J449 Chronic obstructive pulmonary disease, unspecified: Secondary | ICD-10-CM

## 2021-07-09 DIAGNOSIS — R911 Solitary pulmonary nodule: Secondary | ICD-10-CM | POA: Diagnosis not present

## 2021-07-09 MED ORDER — SIMVASTATIN 40 MG PO TABS: 40.0000 mg | ORAL_TABLET | Freq: Every day | ORAL | 3 refills | Status: DC

## 2021-07-09 MED ORDER — SIMVASTATIN 40 MG PO TABS: 40.0000 mg | ORAL_TABLET | Freq: Every day | ORAL | 0 refills | Status: DC

## 2021-07-09 NOTE — Telephone Encounter (Signed)
Spoke to patient . 30 day supply sent to local pharmacy  patient is out   And 90 day supply sent to [facility] for refills   Patient was seen 11/2020 televisit  RN schedule patient for 12 moth visit for 12/04/21

## 2021-07-09 NOTE — Assessment & Plan Note (Signed)
Plan to repeat her super D CT scan of the chest in 06/2022 to ensure interval stability given her history of prior non-small cell lung cancer.  Both of her pulmonary nodules, right upper lobe and right lower lobe are stable in size and appearance.

## 2021-07-09 NOTE — Assessment & Plan Note (Signed)
Probable mild COPD.  Unclear how much obstruction is actually contributing to her functional capacity.  She wants to work on weight loss and exercise before trying bronchodilator therapy.

## 2021-07-09 NOTE — Progress Notes (Signed)
Subjective:    Patient ID: [name], female    DOB: 02/01/1951, 70 y.o.   MRN: [id number]  HPI 70 year old former smoker (20 pk-yrs) gt with a history of obesity, coronary disease, GERD with a hiatal hernia, atrial fibrillation, untreated obstructive sleep apnea, diabetes.  She has a history of left upper lobe lobectomy 2010 for stage I a non-small cell lung cancer, has been seen by Dr Gwenette Greet before, surgery by Dr Arlyce Dice. She underwent single vessel CABG in 07/2017 w Dr Roxan Hockey.     ROV 07/09/21 --follow-up visit 70 year old woman with history of CAD/CABG, GERD with hiatal hernia, A. fib, OSA (untreated), diabetes.  She also has a history of a left upper lobe lobectomy (2010) for a non-small cell lung cancer.  Also with mild COPD.  I had planned for her try albuterol to see if she would get benefit and her last visit in March 2022 - she never received it.  She had a small 2 mm right lower lobe pulmonary nodule that was followed for 2 years and deemed benign.  She had a CT scan of the chest 12/29/20 that confirmed this but also identified a new 3 mm right upper lobe nodule.  She returns after follow-up.  CT scan of the chest on 07/07/2021 and reviewed by me, shows that her right lower lobe nodule remains unchanged.  Likewise the 3 mm right upper lobe nodule is unchanged   Review of Systems  Constitutional:  Negative for fever and unexpected weight change.  HENT:  Negative for congestion, dental problem, ear pain, nosebleeds, postnasal drip, rhinorrhea, sinus pressure, sneezing, sore throat and trouble swallowing.   Eyes:  Negative for redness and itching.  Respiratory:  Positive for shortness of breath. Negative for cough, chest tightness and wheezing.   Cardiovascular:  Positive for palpitations. Negative for leg swelling.  Gastrointestinal:  Negative for nausea and vomiting.  Genitourinary:  Negative for dysuria.  Musculoskeletal:  Negative for joint swelling.  Skin:  Negative for  rash.  Neurological:  Negative for headaches.  Hematological:  Does not bruise/bleed easily.  Psychiatric/Behavioral:  Negative for dysphoric mood. The patient is not nervous/anxious.   Past Medical History:  Diagnosis Date   Arthritis    "knees" (02/05/2016)   Cancer ([place]) 5/10   LUL Vats    CHF (congestive heart failure) (HCC)    Coronary artery disease    GERD (gastroesophageal reflux disease)    tx meds   H/O hiatal hernia    History of blood transfusion "several"   last april 2015 s/p knee surgery (02/05/2016)   Hypercholesteremia    Hypertension    Lung cancer ([place])    2010, surgery 18% left lung no radiation or chemo   Non-STEMI (non-ST elevated myocardial infarction) ([place]) 04/07/2012   See cath results below   Paroxysmal atrial fibrillation ([place]) 04/07/2012   On Warfarin   Peripheral vascular disease ([place]) 8/12   Lt SFA PTA   Presence of stent in LAD coronary artery 04/07/2012   Xience Expedition DES 2.75 mm x 18 mm (dilated to 3.0 mm)   Sleep apnea    does not wear CPAP   Type II diabetes mellitus (HCC)    insulin dependent     Family History  Problem Relation Age of Onset   Hypertension Mother    Coronary artery disease Brother    Hypertension Sister      Social History   Socioeconomic History   Marital status: Single  Spouse name: Not on file   Number of children: Not on file   Years of education: Not on file   Highest education level: Not on file  Occupational History   Occupation: Retired  Tobacco Use   Smoking status: Former    Packs/day: 1.00    Years: 20.00    Pack years: 20.00    Types: Cigarettes    Quit date: 09/27/1984    Years since quitting: 36.8   Smokeless tobacco: Never  Vaping Use   Vaping Use: Never used  Substance and Sexual Activity   Alcohol use: No   Drug use: No   Sexual activity: Not on file  Other Topics Concern   Not on file  Social History Narrative   Patient lives alone.   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     Outpatient Medications Prior to Visit  Medication Sig Dispense Refill   Blood Glucose Monitoring Suppl ([place]) w/Device KIT      Continuous Blood Gluc Receiver (FREESTYLE LIBRE 2 READER) DEVI See admin instructions.     furosemide (LASIX) 40 MG tablet Take 1 tablet (40 mg total) by mouth daily as needed for edema (For a weight over 295 lbs). 90 tablet 0   insulin NPH-regular Human (70-30) 100 UNIT/ML injection Inject into the skin. 60 units in the morning and 20 units at bedtme     Lancets (ONETOUCH DELICA PLUS [id number]) MISC Apply topically.     lisinopril-hydrochlorothiazide (PRINZIDE,ZESTORETIC) 20-25 MG per tablet Take 1 tablet by mouth daily.     metoCLOPramide (REGLAN) 10 MG tablet Take 10 mg by mouth 2 (two) times daily. Per patient     metoprolol tartrate (LOPRESSOR) 50 MG tablet 50 mg in the morning and 25 mg at bedtime 180 tablet 3   ONETOUCH VERIO test strip 1 each 2 (two) times daily.     pantoprazole (PROTONIX) 40 MG tablet Take 1 tablet (40 mg total) by mouth daily. 90 tablet 3   simvastatin (ZOCOR) 40 MG tablet Take 1 tablet (40 mg total) by mouth daily at 6 PM. 30 tablet 0   spironolactone (ALDACTONE) 25 MG tablet Take 1 tablet (25 mg total) by mouth daily as needed (For a weight over 295 lbs). 90 tablet 0   Vitamin D, Cholecalciferol, 1000 units TABS Take 1,000 Units by mouth every morning.      warfarin (COUMADIN) 7.5 MG tablet TAKE 1/2 TO 1 TABLET DAILY AS DIRECTED BY COUMADIN CLINIC 90 tablet 0   No facility-administered  medications prior to visit.        Objective:   Physical Exam Vitals:   07/09/21 1144  BP: (!) 142/67  Pulse: 82  Resp: 16  Temp: 98.6 F (37 C)  TempSrc: Oral  SpO2: 100%  Height: _0  (1.6 m)   Gen: Pleasant, obese woman, in no distress,  normal affect, in a wheelchair  ENT: No lesions,  mouth clear,  oropharynx clear, no postnasal drip  Neck: No JVD, no stridor  Lungs: No use of  accessory muscles, clear bilaterally  Cardiovascular: RRR, heart sounds normal, no murmur or gallops, no peripheral edema  Musculoskeletal: No deformities, no cyanosis or clubbing  Neuro: alert, non focal  Skin: Warm, no lesions or rash      Assessment & Plan:  Pulmonary nodule/lesion, solitary Plan to repeat her super D CT scan of the chest in 06/2022 to ensure interval stability given her history of prior non-small cell lung cancer.  Both of her pulmonary nodules, right upper lobe and right lower lobe are stable in size and appearance.  COPD (chronic obstructive pulmonary disease) (HCC) Probable mild COPD.  Unclear how much obstruction is actually contributing to her functional capacity.  She wants to work on weight loss and exercise before trying bronchodilator therapy.  Baltazar Apo, MD, PhD 07/09/2021, 12:09 PM [facility] [phone] or if no answer [phone]

## 2021-07-09 NOTE — Patient Instructions (Signed)
We reviewed the CT scan of the chest from 06/2021.  Her pulmonary nodules are stable.  Good news. We will plan to repeat your CT scan of the chest in 06/2022 to ensure stability. Please call our office if you develop any difficulty with breathing or any other respiratory symptoms. Follow Dr. Lamonte Sakai in October 2023 to review the results of your scan.  Call sooner if you have any problems.

## 2021-07-10 ENCOUNTER — Ambulatory Visit: Payer: HMO

## 2021-07-10 DIAGNOSIS — Z7901 Long term (current) use of anticoagulants: Secondary | ICD-10-CM

## 2021-07-10 DIAGNOSIS — I480 Paroxysmal atrial fibrillation: Secondary | ICD-10-CM | POA: Diagnosis not present

## 2021-07-10 DIAGNOSIS — Z5181 Encounter for therapeutic drug level monitoring: Secondary | ICD-10-CM

## 2021-07-10 LAB — POCT INR: INR: 2.1 (ref 2.0–3.0)

## 2021-07-10 NOTE — Patient Instructions (Signed)
Continue with 1/2 tablet daily except 1 tablet each Tuesday and Thursday.  Repeat INR in 7 weeks

## 2021-07-14 ENCOUNTER — Emergency Department (HOSPITAL_COMMUNITY): Payer: HMO

## 2021-07-14 ENCOUNTER — Observation Stay (HOSPITAL_COMMUNITY)
Admission: EM | Admit: 2021-07-14 | Discharge: 2021-07-17 | Disposition: A | Discharge status: Home / Self Care | Payer: HMO | Attending: Cardiology | Admitting: Cardiology

## 2021-07-14 DIAGNOSIS — Z23 Encounter for immunization: Secondary | ICD-10-CM | POA: Insufficient documentation

## 2021-07-14 DIAGNOSIS — I480 Paroxysmal atrial fibrillation: Secondary | ICD-10-CM | POA: Diagnosis not present

## 2021-07-14 DIAGNOSIS — E1122 Type 2 diabetes mellitus with diabetic chronic kidney disease: Secondary | ICD-10-CM | POA: Diagnosis not present

## 2021-07-14 DIAGNOSIS — Z794 Long term (current) use of insulin: Secondary | ICD-10-CM | POA: Insufficient documentation

## 2021-07-14 DIAGNOSIS — Z87891 Personal history of nicotine dependence: Secondary | ICD-10-CM | POA: Diagnosis not present

## 2021-07-14 DIAGNOSIS — I5032 Chronic diastolic (congestive) heart failure: Secondary | ICD-10-CM | POA: Diagnosis not present

## 2021-07-14 DIAGNOSIS — I2510 Atherosclerotic heart disease of native coronary artery without angina pectoris: Secondary | ICD-10-CM | POA: Diagnosis not present

## 2021-07-14 DIAGNOSIS — I4892 Unspecified atrial flutter: Principal | ICD-10-CM

## 2021-07-14 DIAGNOSIS — Z85118 Personal history of other malignant neoplasm of bronchus and lung: Secondary | ICD-10-CM | POA: Diagnosis not present

## 2021-07-14 DIAGNOSIS — R000 Tachycardia, unspecified: Secondary | ICD-10-CM | POA: Diagnosis not present

## 2021-07-14 DIAGNOSIS — Z20822 Contact with and (suspected) exposure to covid-19: Secondary | ICD-10-CM | POA: Diagnosis not present

## 2021-07-14 DIAGNOSIS — I484 Atypical atrial flutter: Secondary | ICD-10-CM | POA: Diagnosis not present

## 2021-07-14 DIAGNOSIS — Z96652 Presence of left artificial knee joint: Secondary | ICD-10-CM | POA: Diagnosis not present

## 2021-07-14 DIAGNOSIS — R002 Palpitations: Secondary | ICD-10-CM | POA: Diagnosis not present

## 2021-07-14 DIAGNOSIS — R079 Chest pain, unspecified: Secondary | ICD-10-CM | POA: Diagnosis present

## 2021-07-14 DIAGNOSIS — Z7901 Long term (current) use of anticoagulants: Secondary | ICD-10-CM | POA: Insufficient documentation

## 2021-07-14 DIAGNOSIS — E119 Type 2 diabetes mellitus without complications: Secondary | ICD-10-CM | POA: Diagnosis not present

## 2021-07-14 DIAGNOSIS — Z79899 Other long term (current) drug therapy: Secondary | ICD-10-CM | POA: Insufficient documentation

## 2021-07-14 DIAGNOSIS — Z951 Presence of aortocoronary bypass graft: Secondary | ICD-10-CM | POA: Insufficient documentation

## 2021-07-14 DIAGNOSIS — I517 Cardiomegaly: Secondary | ICD-10-CM | POA: Diagnosis not present

## 2021-07-14 DIAGNOSIS — I130 Hypertensive heart and chronic kidney disease with heart failure and stage 1 through stage 4 chronic kidney disease, or unspecified chronic kidney disease: Secondary | ICD-10-CM | POA: Diagnosis not present

## 2021-07-14 DIAGNOSIS — N183 Chronic kidney disease, stage 3 unspecified: Secondary | ICD-10-CM | POA: Insufficient documentation

## 2021-07-14 LAB — TROPONIN I (HIGH SENSITIVITY)
Troponin I (High Sensitivity): 7 ng/L (ref <18)
Troponin I (High Sensitivity): 10 ng/L (ref <18)

## 2021-07-14 LAB — CBC
HCT: 41.5 % (ref 36.0–46.0)
Hemoglobin: 13 g/dL (ref 12.0–15.0)
MCH: 28.4 pg (ref 26.0–34.0)
MCHC: 31.3 g/dL (ref 30.0–36.0)
MCV: 90.6 fL (ref 80.0–100.0)
Platelets: 219 10*3/uL (ref 150–400)
RBC: 4.58 MIL/uL (ref 3.87–5.11)
RDW: 13.2 % (ref 11.5–15.5)
WBC: 8.3 10*3/uL (ref 4.0–10.5)
nRBC: 0 % (ref 0.0–0.2)

## 2021-07-14 LAB — RESP PANEL BY RT-PCR (FLU A&B, COVID) ARPGX2
Influenza A by PCR: NEGATIVE
Influenza B by PCR: NEGATIVE
SARS Coronavirus 2 by RT PCR: NEGATIVE

## 2021-07-14 LAB — BASIC METABOLIC PANEL
Anion gap: 13 (ref 5–15)
BUN: 16 mg/dL (ref 8–23)
CO2: 24 mmol/L (ref 22–32)
Calcium: 10 mg/dL (ref 8.9–10.3)
Chloride: 99 mmol/L (ref 98–111)
Creatinine, Ser: 1.15 mg/dL — ABNORMAL HIGH (ref 0.44–1.00)
GFR, Estimated: 51 mL/min — ABNORMAL LOW (ref >60)
Glucose, Bld: 214 mg/dL — ABNORMAL HIGH (ref 70–99)
Potassium: 3.5 mmol/L (ref 3.5–5.1)
Sodium: 136 mmol/L (ref 135–145)

## 2021-07-14 LAB — D-DIMER, QUANTITATIVE: D-Dimer, Quant: 0.34 ug/mL-FEU (ref 0.00–0.50)

## 2021-07-14 LAB — PROTIME-INR
INR: 1.5 — ABNORMAL HIGH (ref 0.8–1.2)
Prothrombin Time: 18.1 seconds — ABNORMAL HIGH (ref 11.4–15.2)

## 2021-07-14 LAB — BRAIN NATRIURETIC PEPTIDE: B Natriuretic Peptide: 163.5 pg/mL — ABNORMAL HIGH (ref 0.0–100.0)

## 2021-07-14 LAB — TSH: TSH: 2.181 u[IU]/mL (ref 0.350–4.500)

## 2021-07-14 LAB — CBG MONITORING, ED: Glucose-Capillary: 284 mg/dL — ABNORMAL HIGH (ref 70–99)

## 2021-07-14 MED ORDER — INSULIN ASPART PROT & ASPART (70-30 MIX) 100 UNIT/ML ~~LOC~~ SUSP
62.0000 [IU] | Freq: Every day | SUBCUTANEOUS | Status: DC
  Administered 2021-07-15 – 2021-07-17 (×2): 62 [IU] via SUBCUTANEOUS
  Filled 2021-07-14 (×2): qty 10

## 2021-07-14 MED ORDER — ONDANSETRON HCL 4 MG/2ML IJ SOLN: 4.0000 mg | Freq: Four times a day (QID) | INTRAMUSCULAR | Status: DC | PRN

## 2021-07-14 MED ORDER — ALPRAZOLAM 0.25 MG PO TABS: 0.2500 mg | ORAL_TABLET | Freq: Two times a day (BID) | ORAL | Status: DC | PRN

## 2021-07-14 MED ORDER — PANTOPRAZOLE SODIUM 40 MG PO TBEC: 40.0000 mg | DELAYED_RELEASE_TABLET | Freq: Every day | ORAL | Status: DC

## 2021-07-14 MED ORDER — SODIUM CHLORIDE 0.9 % IV BOLUS
1000.0000 mL | Freq: Once | INTRAVENOUS | Status: AC
  Administered 2021-07-14: 1000 mL via INTRAVENOUS

## 2021-07-14 MED ORDER — INSULIN ASPART PROT & ASPART (70-30 MIX) 100 UNIT/ML ~~LOC~~ SUSP: 62.0000 [IU] | Freq: Every day | SUBCUTANEOUS | Status: DC

## 2021-07-14 MED ORDER — NITROGLYCERIN 0.4 MG SL SUBL
0.4000 mg | SUBLINGUAL_TABLET | SUBLINGUAL | Status: DC | PRN
Start: 2021-07-14 — End: 2021-07-17

## 2021-07-14 MED ORDER — ACETAMINOPHEN 325 MG PO TABS: 650.0000 mg | ORAL_TABLET | ORAL | Status: DC | PRN

## 2021-07-14 MED ORDER — SIMVASTATIN 20 MG PO TABS
40.0000 mg | ORAL_TABLET | Freq: Every day | ORAL | Status: DC
  Administered 2021-07-15 – 2021-07-16 (×2): 40 mg via ORAL
  Filled 2021-07-14 (×2): qty 2

## 2021-07-14 MED ORDER — METOPROLOL TARTRATE 50 MG PO TABS
50.0000 mg | ORAL_TABLET | Freq: Every morning | ORAL | Status: DC
  Administered 2021-07-15 – 2021-07-17 (×3): 50 mg via ORAL
  Filled 2021-07-14 (×2): qty 1
  Filled 2021-07-14: qty 2

## 2021-07-14 MED ORDER — WARFARIN - PHARMACIST DOSING INPATIENT: Freq: Every day | Status: DC

## 2021-07-14 MED ORDER — PANTOPRAZOLE 2 MG/ML SUSPENSION
40.0000 mg | Freq: Every day | ORAL | Status: DC
  Administered 2021-07-14 – 2021-07-17 (×4): 40 mg via ORAL
  Filled 2021-07-14 (×4): qty 20

## 2021-07-14 MED ORDER — ENOXAPARIN SODIUM 40 MG/0.4ML IJ SOSY
40.0000 mg | PREFILLED_SYRINGE | INTRAMUSCULAR | Status: DC
  Administered 2021-07-14: 40 mg via SUBCUTANEOUS
  Filled 2021-07-14: qty 0.4

## 2021-07-14 MED ORDER — INSULIN ASPART PROT & ASPART (70-30 MIX) 100 UNIT/ML ~~LOC~~ SUSP
20.0000 [IU] | Freq: Every day | SUBCUTANEOUS | Status: DC
  Administered 2021-07-16: 20 [IU] via SUBCUTANEOUS
  Filled 2021-07-14: qty 10

## 2021-07-14 MED ORDER — METOCLOPRAMIDE HCL 5 MG PO TABS
10.0000 mg | ORAL_TABLET | Freq: Two times a day (BID) | ORAL | Status: DC
  Administered 2021-07-14 – 2021-07-17 (×6): 10 mg via ORAL
  Filled 2021-07-14: qty 2
  Filled 2021-07-14: qty 1
  Filled 2021-07-14 (×2): qty 2
  Filled 2021-07-14: qty 1
  Filled 2021-07-14: qty 2

## 2021-07-14 MED ORDER — ZOLPIDEM TARTRATE 5 MG PO TABS: 5.0000 mg | ORAL_TABLET | Freq: Every evening | ORAL | Status: DC | PRN

## 2021-07-14 MED ORDER — FUROSEMIDE 20 MG PO TABS
80.0000 mg | ORAL_TABLET | Freq: Once | ORAL | Status: AC
  Administered 2021-07-14: 80 mg via ORAL
  Filled 2021-07-14: qty 4

## 2021-07-14 MED ORDER — INSULIN ASPART 100 UNIT/ML IJ SOLN
0.0000 [IU] | Freq: Three times a day (TID) | INTRAMUSCULAR | Status: DC
  Administered 2021-07-15: 5 [IU] via SUBCUTANEOUS
  Administered 2021-07-16 – 2021-07-17 (×3): 3 [IU] via SUBCUTANEOUS

## 2021-07-14 MED ORDER — LISINOPRIL 20 MG PO TABS
20.0000 mg | ORAL_TABLET | Freq: Once | ORAL | Status: AC
  Administered 2021-07-14: 20 mg via ORAL
  Filled 2021-07-14: qty 1

## 2021-07-14 MED ORDER — SPIRONOLACTONE 25 MG PO TABS
25.0000 mg | ORAL_TABLET | Freq: Every day | ORAL | Status: DC | PRN
  Administered 2021-07-15: 25 mg via ORAL
  Filled 2021-07-14: qty 1

## 2021-07-14 MED ORDER — SODIUM CHLORIDE 0.9 % IV SOLN: 250.0000 mL | INTRAVENOUS | Status: DC | PRN

## 2021-07-14 MED ORDER — VITAMIN D 25 MCG (1000 UNIT) PO TABS
1000.0000 [IU] | ORAL_TABLET | ORAL | Status: DC
  Administered 2021-07-15 – 2021-07-17 (×3): 1000 [IU] via ORAL
  Filled 2021-07-14 (×3): qty 1

## 2021-07-14 MED ORDER — METOPROLOL TARTRATE 25 MG PO TABS
50.0000 mg | ORAL_TABLET | Freq: Once | ORAL | Status: AC
  Administered 2021-07-14: 50 mg via ORAL
  Filled 2021-07-14: qty 2

## 2021-07-14 MED ORDER — SODIUM CHLORIDE 0.9% FLUSH
3.0000 mL | Freq: Two times a day (BID) | INTRAVENOUS | Status: DC
  Administered 2021-07-14 – 2021-07-17 (×3): 3 mL via INTRAVENOUS

## 2021-07-14 MED ORDER — WARFARIN SODIUM 10 MG PO TABS
10.0000 mg | ORAL_TABLET | Freq: Once | ORAL | Status: AC
  Administered 2021-07-14: 10 mg via ORAL
  Filled 2021-07-14: qty 1

## 2021-07-14 MED ORDER — INSULIN ASPART 100 UNIT/ML IJ SOLN
0.0000 [IU] | Freq: Every day | INTRAMUSCULAR | Status: DC
  Administered 2021-07-14: 3 [IU] via SUBCUTANEOUS
  Administered 2021-07-15: 2 [IU] via SUBCUTANEOUS
  Administered 2021-07-16: 3 [IU] via SUBCUTANEOUS

## 2021-07-14 MED ORDER — HYDROCHLOROTHIAZIDE 25 MG PO TABS
25.0000 mg | ORAL_TABLET | Freq: Every day | ORAL | Status: DC
  Administered 2021-07-14 – 2021-07-17 (×4): 25 mg via ORAL
  Filled 2021-07-14 (×4): qty 1

## 2021-07-14 MED ORDER — SODIUM CHLORIDE 0.9% FLUSH: 3.0000 mL | INTRAVENOUS | Status: DC | PRN

## 2021-07-14 NOTE — Progress Notes (Signed)
ANTICOAGULATION CONSULT NOTE - Initial Consult  Pharmacy Consult for warfarin dosing Indication: atrial fibrillation   Allergies  Allergen Reactions   Levofloxacin Itching   Morphine And Related Itching    Pt reports oxycodone history with no allergy.    Vital Signs: Temp: 98 F (36.7 C) (10/18 1223) Temp Source: Oral (10/18 1223) BP: 129/61 (10/18 2100) Pulse Rate: 96 (10/18 2100)  Labs: Recent Labs    07/14/21 0710 07/14/21 1322 07/14/21 1410  HGB 13.0  --   --   HCT 41.5  --   --   PLT 219  --   --   LABPROT  --   --  18.1*  INR  --   --  1.5*  CREATININE 1.15*  --   --   TROPONINIHS 7 10  --     CrCl cannot be calculated (Unknown ideal weight.).   Medical History: Past Medical History:  Diagnosis Date   Arthritis    "knees" (02/05/2016)   Cancer ([place]) 5/10   LUL Vats    CHF (congestive heart failure) (HCC)    Coronary artery disease    GERD (gastroesophageal reflux disease)    tx meds   H/O hiatal hernia    History of blood transfusion "several"   last april 2015 s/p knee surgery (02/05/2016)   Hypercholesteremia    Hypertension    Lung cancer ([place])    2010, surgery 18% left lung no radiation or chemo   Non-STEMI (non-ST elevated myocardial infarction) ([facility]) 04/07/2012   See cath results below   Paroxysmal atrial fibrillation ([facility]) 04/07/2012   On Warfarin   Peripheral vascular disease ([facility]) 8/12   Lt SFA PTA   Presence of stent in LAD coronary artery 04/07/2012   Xience Expedition DES 2.75 mm x 18 mm (dilated to 3.0 mm)   Sleep apnea    does not wear CPAP   Type II diabetes mellitus (HCC)    insulin dependent    Medications:  (Not in a hospital admission)  Scheduled:   hydrochlorothiazide  25 mg Oral Daily   Infusions:   Assessment: Patient presents with tachycardia (HR up to 121) but no chest pain. On warfarin PTA for a.fib. 7.5 mg T and Th, 3.75 mg all other days. Last dose 10/18 at 2000. No missed doses in past week, no dietary changes,  and no medications started that would affect INR. Subtherapeutic INR of 1.5 today. CBC WNL, no signs of bleeding.   Goal of Therapy:  INR 2-3 Monitor platelets by anticoagulation protocol: Yes   Plan:  Give warfarin 10mg  today (~1.5x home dose) Daily INR to determine subsequent doses  Donald Pore 07/14/2021,9:15 PM

## 2021-07-14 NOTE — H&P (Signed)
Cardiology Admission History and Physical:   Patient ID: [name] MRN: [id number]; DOB: 12-02-1950   Admission date: 07/14/2021  PCP:  [name], MD   [facility] Providers Cardiologist:  Sanda Klein, MD        Chief Complaint:  Atrial fibrillation/Atrial flutter, presents with palpitations  Patient Profile:   [name] is a 70 y.o. female with history of CAD s/p CABG, PAD, HFpEF, PAF who is being seen 07/14/2021 for the evaluation of atrial fibrillation.  History of Present Illness:   [name] has a PMH of CABG (LIMA-LAD 11/18), PAD, chronic diastolic CHF, and paroxysmal atrial fibrillation.  Her PMH also includes type 2 diabetes on insulin, and morbid obesity. She underwent CABG November 2018 for recurrent in-stent restenosis with multiple PCI.  (PCI with DES to LAD 2013, 2015 DES to distal LAD, 4/17 received DES in proximal LAD, 11/18 again with 3 stenosis and was referred for single-vessel CABG).  She is also status post left superficial femoral artery atherectomy.    She woke up early this morning to go check on the furnace which was not working and noticed that she felt sick with palpitations.  Denies chest pain.  Used her pulse oximeter to check her heart rate and noted the pulse was 240 beats a minute.  We discussed inaccuracies of pulse oximeter in irregular rhythm, and she agrees that a few minutes later she took her heart rate again and noticed it was in the 120s.  It stayed sustained in the 120s and she continued to have palpitations therefore she presented to the ER early this morning.  She has a known history of paroxysmal/persistent atrial fibrillation with atypical atrial flutter at times and variable AV block.  She overall has good rate control.  CHA2DS2-VASc is 78 (age, gender, diabetes, heart failure, CAD, hypertension).  She is anticoagulated with warfarin.  Some mild fluctuations in her INR over the last 12 months, however she notes  no change in her diet or medications to cause this.  Today her INR is 1.5 on Friday it was 2.1.  She has not taken her home medications this morning due to being in the ER and had just received them not too long ago.  She also feels she has not received her evening medications.  This may be contributing to less than optimal rate control.  Telemetry shows coarse atrial fibrillation versus atypical atrial flutter.  EKG shows the same with right bundle branch block.  Vital signs reviewed, blood pressure stable.  Heart rate around 100-105.  Pertinent laboratory studies include sodium 136, potassium 3.5, creatinine 1.15, glucose 214, troponin 7, 10, BNP 163, D-dimer negative, INR 1.5, TSH 2.1, hemoglobin 13, platelets 219,000, white blood cell count 8300.  Past Medical History:  Diagnosis Date   Arthritis    "knees" (02/05/2016)   Cancer ([place]) 5/10   LUL Vats    CHF (congestive heart failure) ([place])    Coronary artery disease    GERD (gastroesophageal reflux disease)    tx meds   H/O hiatal hernia    History of blood transfusion "several"   last april 2015 s/p knee surgery (02/05/2016)   Hypercholesteremia    Hypertension    Lung cancer ([place])    2010, surgery 18% left lung no radiation or chemo   Non-STEMI (non-ST elevated myocardial infarction) ([place]) 04/07/2012   See cath results below   Paroxysmal atrial fibrillation ([place]) 04/07/2012   On Warfarin   Peripheral vascular disease ([place])  8/12   Lt SFA PTA   Presence of stent in LAD coronary artery 04/07/2012   Xience Expedition DES 2.75 mm x 18 mm (dilated to 3.0 mm)   Sleep apnea    does not wear CPAP   Type II diabetes mellitus (HCC)    insulin dependent    Past Surgical History:  Procedure Laterality Date   ABDOMINAL HYSTERECTOMY  1990   BREAST EXCISIONAL BIOPSY Right    CARDIAC CATHETERIZATION  11/04/2008   patent RCA, LM, and Circ, nl EF   CARDIAC CATHETERIZATION  02/20/2002   patent coronaries with the only abnormality being a  smooth luminla irregularity in the mid intermediate ramus branch no felt to be hemodynamically significant, nl LV   CARDIAC CATHETERIZATION N/A 02/05/2016   Procedure: Left Heart Cath and Coronary Angiography;  Surgeon: Leonie Man, MD;  Location: [place] [facility];  Service: Cardiovascular;  Laterality: N/A;   CARDIAC CATHETERIZATION N/A 02/05/2016   Procedure: Coronary Stent Intervention;  Surgeon: Leonie Man, MD;  Location: [place] [facility];  Service: Cardiovascular;  Laterality: N/A;   CATARACT EXTRACTION W/ INTRAOCULAR LENS  IMPLANT, BILATERAL  2013   CORONARY ANGIOPLASTY WITH STENT PLACEMENT  04/07/2012   Xience Expedition DES 2.36m x 18 mm (dilated to 3.0 mm) to the prox LAD    CORONARY ANGIOPLASTY WITH STENT PLACEMENT  2013; 2015   CORONARY ARTERY BYPASS GRAFT N/A 08/17/2017   Procedure: CORONARY ARTERY BYPASS GRAFTING (CABG) TIMES 1 USING LEFT INTERNAL MAMMARY ARTERY;  Surgeon: HMelrose Nakayama MD;  [facility]  Service: Open Heart Surgery;  Laterality: N/A;   DILATION AND CURETTAGE OF UTERUS  <1990   ESOPHAGOGASTRODUODENOSCOPY N/A 05/23/2015   Procedure: ESOPHAGOGASTRODUODENOSCOPY (EGD);  Surgeon: PCarol Ada MD;  [facility];  Service: Endoscopy;  Laterality: N/A;   ESOPHAGOGASTRODUODENOSCOPY (EGD) WITH PROPOFOL N/A 06/13/2015   Procedure: ESOPHAGOGASTRODUODENOSCOPY (EGD) WITH PROPOFOL;  Surgeon: PCarol Ada MD;  [facility];  Service: Endoscopy;  Laterality: N/A;   ESOPHAGOGASTRODUODENOSCOPY (EGD) WITH PROPOFOL N/A 04/07/2018   Procedure: ESOPHAGOGASTRODUODENOSCOPY (EGD) WITH PROPOFOL;  Surgeon: HCarol Ada MD;  [facility];  Service: Endoscopy;  Laterality: N/A;  fluoroscopy is needed   FRACTURE SURGERY     HAMMER TOE SURGERY Bilateral 1993   with screws, on screw removed   JOINT REPLACEMENT     KNEE ARTHROSCOPY Bilateral    left x2, right x1   LAPAROSCOPIC CHOLECYSTECTOMY     LEFT HEART CATH AND CORONARY ANGIOGRAPHY N/A  08/11/2017   Procedure: LEFT HEART CATH AND CORONARY ANGIOGRAPHY;  Surgeon: JMartinique Peter M, MD;  [facility];  Service: Cardiovascular;  Laterality: N/A;   LEFT HEART CATHETERIZATION WITH CORONARY ANGIOGRAM N/A 04/07/2012   Procedure: LEFT HEART CATHETERIZATION WITH CORONARY ANGIOGRAM;  Surgeon: JLorretta Harp MD;  [facility];  Service: Cardiovascular;  Laterality: N/A;   LEFT HEART CATHETERIZATION WITH CORONARY ANGIOGRAM N/A 05/27/2014   Procedure: LEFT HEART CATHETERIZATION WITH CORONARY ANGIOGRAM;  Surgeon: JLorretta Harp MD; LAD 99% ISR, CFX 50-60%, RCA (dominant) no sig dz, EF 60%   LOWER EXTREMITY ANGIOGRAM  05/17/11   directional atherectomy to the prox L SFA using a LX Man TurboHawk, ballooned with a [place] balloon    ORIF ANKLE FRACTURE  07/09/2012   Procedure: OPEN REDUCTION INTERNAL FIXATION (ORIF) ANKLE FRACTURE;  Surgeon: GMeredith Pel MD;  [facility];  Service: Orthopedics;  Laterality: Left;  open reduction internal fixation trimalleolar ankle fracture  medial malleolous fixation   PERCUTANEOUS CORONARY STENT INTERVENTION (PCI-S) N/A 04/07/2012   Procedure: PERCUTANEOUS CORONARY STENT INTERVENTION (PCI-S);  Surgeon: [name], MD;  [facility];  Service: Cardiovascular;  Laterality: N/A;   PERCUTANEOUS CORONARY STENT INTERVENTION (PCI-S)  05/27/2014   Procedure: PERCUTANEOUS CORONARY STENT INTERVENTION (PCI-S);  Surgeon: [name], MD; 3 mm x 12 mm long Xience Xpedition DES to the proximal LAD   SAVORY DILATION N/A 06/13/2015   Procedure: SAVORY DILATION;  Surgeon: Carol Ada, MD;  [facility];  Service: Endoscopy;  Laterality: N/A;   SAVORY DILATION N/A 04/07/2018   Procedure: SAVORY DILATION;  Surgeon: Carol Ada, MD;  [facility];  Service: Endoscopy;  Laterality: N/A;   TEE WITHOUT CARDIOVERSION N/A 08/17/2017   Procedure: TRANSESOPHAGEAL ECHOCARDIOGRAM (TEE);  Surgeon: Melrose Nakayama, MD;  Location: [place];  Service: Open Heart Surgery;  Laterality: N/A;   TONSILLECTOMY     TOTAL KNEE ARTHROPLASTY Left 2003   TOTAL KNEE REVISION Left 11/05/2013   Procedure: LEFT TOTAL KNEE RESECTION;  Surgeon: Mauri Pole, MD;  [facility];  Service: Orthopedics;  Laterality: Left;   TOTAL KNEE REVISION Left 2007   opened in 2006 and cleaned out, 2007 revision   TOTAL KNEE REVISION Left 12/31/2013   Procedure: RE-INPLANTATION LEFT TOTAL KNEE ;  Surgeon: Mauri Pole, MD;  [facility];  Service: Orthopedics;  Laterality: Left;   VIDEO ASSISTED THORACOSCOPY (VATS)/ LOBECTOMY  01/2009     Medications Prior to Admission: Prior to Admission medications   Medication Sig Start Date End Date Taking? Authorizing Provider  furosemide (LASIX) 40 MG tablet Take 1 tablet (40 mg total) by mouth daily as needed for edema (For a weight over 295 lbs). 12/18/20  Yes Croitoru, Mihai, MD  insulin NPH-regular Human (70-30) 100 UNIT/ML injection Inject 20-62 Units into the skin 2 (two) times daily with a meal. 62 units in the morning and 20 units at bedtme   Yes [provider]  lisinopril-hydrochlorothiazide (PRINZIDE,ZESTORETIC) 20-25 MG per tablet Take 1 tablet by mouth daily.   Yes [provider]  metoCLOPramide (REGLAN) 10 MG tablet Take 10 mg by mouth 2 (two) times daily. Per patient   Yes [provider]  metoprolol tartrate (LOPRESSOR) 50 MG tablet 50 mg in the morning and 25 mg at bedtime 05/12/21  Yes Croitoru, Mihai, MD  pantoprazole (PROTONIX) 40 MG tablet Take 1 tablet (40 mg total) by mouth daily. 06/17/21  Yes Croitoru, Mihai, MD  simvastatin (ZOCOR) 40 MG tablet Take 1 tablet (40 mg total) by mouth daily at 6 PM. 07/09/21  Yes Croitoru, Mihai, MD  spironolactone (ALDACTONE) 25 MG tablet Take 1 tablet (25 mg total) by mouth daily as needed (For a weight over 295 lbs). 12/18/20  Yes Croitoru, Mihai, MD  Vitamin D, Cholecalciferol, 1000 units TABS Take  1,000 Units by mouth every morning.    Yes [provider]  warfarin (COUMADIN) 7.5 MG tablet TAKE 1/2 TO 1 TABLET DAILY AS DIRECTED BY COUMADIN CLINIC Patient taking differently: Take 3.75-7.5 mg by mouth as directed. TAKE 1 TABLET (7.5 MG) ON (TUES & THURS) & TAKE 1/2 TABLET (3.75 MG) ALL OTHER DAYS AS DIRECTED BY COUMADIN CLINIC 05/26/21  Yes Cleaver, Jossie Ng, NP  Blood Glucose Monitoring Suppl ([place]) w/Device KIT  07/23/20   [provider]  Continuous Blood Gluc Receiver (FREESTYLE LIBRE 2 READER) [place] See admin instructions. 11/18/20   [provider]  Lancets (ONETOUCH DELICA PLUS [id number]) MISC Apply topically. 08/13/20   [provider]  Glory Rosebush VERIO test strip 1 each 2 (two) times daily. 10/11/20   [provider]  simvastatin (ZOCOR) 40 MG tablet Take 1 tablet (40 mg total) by mouth at bedtime. 07/09/21 10/07/21  Croitoru, Dani Gobble, MD     Allergies:    Allergies  Allergen Reactions   Levofloxacin Itching   Morphine And Related Itching    Pt reports oxycodone history with no allergy.    Social History:   Social History   Socioeconomic History   Marital status: Single    Spouse name: Not on file   Number of children: Not on file   Years of education: Not on file   Highest education level: Not on file  Occupational History   Occupation: Retired  Tobacco Use   Smoking status: Former    Packs/day: 1.00    Years: 20.00    Pack years: 20.00    Types: Cigarettes    Quit date: 09/27/1984    Years since quitting: 36.8   Smokeless tobacco: Never  Vaping Use   Vaping Use: Never used  Substance and Sexual Activity   Alcohol use: No   Drug use: No   Sexual activity: Not on file  Other Topics Concern   Not on file  Social History Narrative   Patient lives alone.   Social Determinants of Health   Financial Resource Strain: Not on file  Food Insecurity: Not on file  Transportation Needs: Not on file   Physical Activity: Not on file  Stress: Not on file  Social Connections: Not on file  Intimate Partner Violence: Not on file    Family History:   The patient's family history includes Coronary artery disease in her brother; Hypertension in her mother and sister.    ROS:  Please see the history of present illness.  All other ROS reviewed and negative.     Physical Exam/Data:   Vitals:   07/14/21 1900 07/14/21 1930 07/14/21 2030 07/14/21 2100  BP: (!) 151/58 (!) 111/54 (!) 122/59 129/61  Pulse: 85 (!) 102 95 96  Resp: (!) 28 (!) 21 (!) 28 (!) 26  Temp:      TempSrc:      SpO2: 100% 99% 98% 100%    Intake/Output Summary (Last 24 hours) at 07/14/2021 2133 Last data filed at 07/14/2021 1620 Gross per 24 hour  Intake 1000 ml  Output --  Net 1000 ml   Last 3 Weights 12/18/2020 12/05/2020 06/20/2020  Weight (lbs) 293 lb 298 lb 4.8 oz 292 lb  Weight (kg) 132.904 kg 135.308 kg 132.45 kg     There is no height or weight on file to calculate BMI.  Constitutional: No acute distress Eyes: sclera non-icteric, normal conjunctiva and lids ENMT: normal dentition, moist mucous membranes Cardiovascular: Irregular rhythm, mildly tachycardic rate, no murmur. S1 and S2 normal. No jugular venous distention.  Respiratory: clear to auscultation bilaterally GI : normal bowel sounds, soft and nontender. No distention.   MSK: extremities warm, well perfused. No edema.  Warm extremities NEURO: grossly nonfocal exam, moves all extremities. PSYCH: alert and oriented x 3, normal mood and affect.     EKG:  The ECG that was done today was personally reviewed and demonstrates atrial flutter with variable A-V block and right bundle branch block.  Relevant CV Studies: None this admission  Laboratory Data:  High Sensitivity Troponin:   Recent Labs  Lab  07/14/21 0710 07/14/21 1322  TROPONINIHS 7 10      Chemistry Recent Labs  Lab 07/14/21 0710  NA 136  K 3.5  CL 99  CO2 24  GLUCOSE 214*   BUN 16  CREATININE 1.15*  CALCIUM 10.0  GFRNONAA 51*  ANIONGAP 13    No results for input(s): PROT, ALBUMIN, AST, ALT, ALKPHOS, BILITOT in the last 168 hours. Lipids No results for input(s): CHOL, TRIG, HDL, LABVLDL, LDLCALC, CHOLHDL in the last 168 hours. Hematology Recent Labs  Lab 07/14/21 0710  WBC 8.3  RBC 4.58  HGB 13.0  HCT 41.5  MCV 90.6  MCH 28.4  MCHC 31.3  RDW 13.2  PLT 219   Thyroid  Recent Labs  Lab 07/14/21 1410  TSH 2.181   BNP Recent Labs  Lab 07/14/21 1410  BNP 163.5*    DDimer  Recent Labs  Lab 07/14/21 1610  DDIMER 0.34     Radiology/Studies:  DG Chest 2 View  Result Date: 07/14/2021 CLINICAL DATA:  70 year old female with tachycardia. EXAM: CHEST - 2 VIEW COMPARISON:  Recent chest CT 07/07/2021 and earlier. FINDINGS: Semi upright AP and lateral views of the chest. Prior sternotomy, CABG. Stable cardiomegaly and mediastinal contours. Stable chronically somewhat low lung volumes and increased pulmonary interstitial markings. Previous left upper lobectomy. No pneumothorax, pleural effusion, consolidation, or acute pulmonary opacity identified. No acute osseous abnormality identified. Negative visible bowel gas pattern. IMPRESSION: No acute cardiopulmonary abnormality. Chronic cardiomegaly, prior left upper lobectomy. Electronically Signed   By: Brandi Ann M.D.   On: 07/14/2021 07:40     Assessment and Plan:   Active Problems:   Atrial flutter ([place])  She appears to be in atrial flutter with variable AV conduction and relatively good rate control with a heart rate around 100.  Patient was offered to be discharged from the ER and follow-up closely in cardiology clinic, however patient feels more comfortable staying for further assessment.  We discussed resuming her home medications, and evaluating her symptoms in the morning.  Could consider increasing the dose of her metoprolol, currently she takes 50 mg in the morning and 25 mg at night.  Could  also consider the addition of diltiazem.  She has had an erratic schedule of home medications today therefore we will just resume these and administer tonight and evaluate rate control in the morning.  We have briefly discussed TEE and cardioversion, however with reasonably good rate control and subtherapeutic INR, she may do better as an outpatient DCCV in 3 to 4 weeks.  No strong indication to initiate heparin, would just resume her warfarin at home dose and continue her other home medications.  BNP mildly elevated, this may reflect why she went into atrial flutter if she has mild decompensation of her heart failure.  I will give her a one-time dose of furosemide 80 mg p.o. and monitor for response in the morning, if more aggressive diuresis felt to be necessary, can consider IV doses tomorrow.  She normally takes 40 mg of Lasix daily. No weight has been obtained thus far this admission.  Her dry weight is somewhere between 290 pounds and 295 pounds.  She denies any heart failure symptoms, and currently does not appear decompensated. Risk Assessment/Risk Scores:         CHA2DS2-VASc Score = 6   This indicates a 9.7% annual risk of stroke. The patient's score is based upon: CHF History: 1 HTN History: 1 Diabetes History: 1 Stroke History: 0 Vascular Disease  History: 1 Age Score: 1 Gender Score: 1     Severity of Illness: The appropriate patient status for this patient is OBSERVATION. Observation status is judged to be reasonable and necessary in order to provide the required intensity of service to ensure the patient's safety. The patient's presenting symptoms, physical exam findings, and initial radiographic and laboratory data in the context of their medical condition is felt to place them at decreased risk for further clinical deterioration. Furthermore, it is anticipated that the patient will be medically stable for discharge from the hospital within 2 midnights of admission.    For  questions or updates, please contact [place] Please consult [email or web address] for contact info under     Signed, Elouise Munroe, MD  07/14/2021 9:33 PM

## 2021-07-14 NOTE — ED Provider Notes (Signed)
[facility] Provider Note   CSN: [id number] Arrival date & time: 07/14/21  7616     History Chief Complaint  Patient presents with   Palpitations    [name] is a 70 y.o. female.  70 year old female who presents emergency department with a chief complaint of racing heart.  Patient states that she had onset of racing heart about an hour before she arrived at the emergency department.  She had palpitations as well.  She states that this has resolved however she appears to have continued elevated heart rate here in the emergency department.  She denies fevers, chills, nausea, vomiting, chest pain, shortness of breath, urinary symptoms, abdominal pain or diarrhea.  She denies any increased exertional dyspnea.  She has chronic peripheral edema.  The history is provided by the patient. No language interpreter was used.  Palpitations Palpitations quality:  Fast Onset quality:  Sudden Duration:  1 hour Timing:  Constant Progression:  Resolved Chronicity:  New Context: not anxiety, not appetite suppressants, not blood loss, not bronchodilators, not caffeine, not dehydration, not exercise, not hyperventilation, not illicit drugs, not nicotine and not stimulant use   Relieved by:  Nothing Worsened by:  Nothing Ineffective treatments:  None tried Associated symptoms: no back pain, no chest pain, no chest pressure, no cough, no diaphoresis, no dizziness, no hemoptysis, no leg pain, no lower extremity edema, no malaise/fatigue, no nausea, no near-syncope, no numbness, no orthopnea, no PND, no shortness of breath, no syncope, no vomiting and no weakness   Risk factors: hx of atrial fibrillation       Past Medical History:  Diagnosis Date   Arthritis    "knees" (02/05/2016)   Cancer ([place]) 5/10   LUL Vats    CHF (congestive heart failure) (HCC)    Coronary artery disease    GERD (gastroesophageal reflux disease)    tx meds   H/O hiatal  hernia    History of blood transfusion "several"   last april 2015 s/p knee surgery (02/05/2016)   Hypercholesteremia    Hypertension    Lung cancer ([place])    2010, surgery 18% left lung no radiation or chemo   Non-STEMI (non-ST elevated myocardial infarction) ([place]) 04/07/2012   See cath results below   Paroxysmal atrial fibrillation ([place]) 04/07/2012   On Warfarin   Peripheral vascular disease ([place]) 8/12   Lt SFA PTA   Presence of stent in LAD coronary artery 04/07/2012   Xience Expedition DES 2.75 mm x 18 mm (dilated to 3.0 mm)   Sleep apnea    does not wear CPAP   Type II diabetes mellitus (HCC)    insulin dependent    Patient Active Problem List   Diagnosis Date Noted   COPD (chronic obstructive pulmonary disease) ([place]) 12/05/2020   Hypercholesterolemia 01/11/2018   Diabetes mellitus type 2 in obese ([place]) 01/11/2018   Pulmonary nodule/lesion, solitary 11/23/2017   RBBB 10/31/2017   S/P CABG x 1 08/17/2017   Abnormal nuclear stress test 02/05/2016   Chest pain with high risk for cardiac etiology 02/05/2016   CAD S/P percutaneous coronary angioplasty 02/05/2016   GERD (gastroesophageal reflux disease) 01/06/2016   CKD (chronic kidney disease) stage 3, GFR 30-59 ml/min (HCC) 01/06/2016   Chronic diastolic heart failure ([place]) 06/28/2015   Esophageal reflux 02/15/2014   Anemia 01/17/2014   S/P left TK revision 12/31/2013   Knee osteomyelitis, left ([place]) 11/21/2013   Foreign body of knee with infection 11/18/2013  Unspecified constipation 11/07/2013   Expected blood loss anemia 11/06/2013   Left knee spacer, following removal of joint prosthesis 11/05/2013   DM (diabetes mellitus), type 2 with peripheral vascular complications ([place]) 34/19/3790   Onychomycosis 10/23/2013   Pain, foot 10/23/2013   DJD (degenerative joint disease) of knee 04/10/2013   Vaginal atrophy 01/01/2013   OAB (overactive bladder) 01/01/2013   Osteopenia 01/01/2013   Chest pain 11/28/2012    Palpitations 11/22/2012   Dyslipidemia 11/22/2012   Chronic anticoagulation 11/22/2012   Super obese 07/11/2012   Trimalleolar fracture 07/07/2012   Coronary artery disease involving native coronary artery of native heart without angina pectoris 04/08/2012   ICM, EF 40-45% cath 04/07/12- improved to 50-60% by echo Oct 2013 04/08/2012   Paroxysmal atrial fibrillation ([place]) 04/07/2012   NSTEMI (non-ST elevated myocardial infarction)- 7/13 04/07/2012   Unstable angina ([facility]) 04/07/2012   PVD (peripheral vascular disease), Lt SFA PTA Aug 2012 04/07/2012   Cancer of left lung parenchyma ([place]) 01/13/2009   Insulin dependent diabetes mellitus ([place]) 11/27/2008   Essential hypertension 11/27/2008    Past Surgical History:  Procedure Laterality Date   ABDOMINAL HYSTERECTOMY  1990   BREAST EXCISIONAL BIOPSY Right    CARDIAC CATHETERIZATION  11/04/2008   patent RCA, LM, and Circ, nl EF   CARDIAC CATHETERIZATION  02/20/2002   patent coronaries with the only abnormality being a smooth luminla irregularity in the mid intermediate ramus branch no felt to be hemodynamically significant, nl LV   CARDIAC CATHETERIZATION N/A 02/05/2016   Procedure: Left Heart Cath and Coronary Angiography;  Surgeon: Leonie Man, MD;  Location: [facility];  Service: Cardiovascular;  Laterality: N/A;   CARDIAC CATHETERIZATION N/A 02/05/2016   Procedure: Coronary Stent Intervention;  Surgeon: Leonie Man, MD;  Location: [facility];  Service: Cardiovascular;  Laterality: N/A;   CATARACT EXTRACTION W/ INTRAOCULAR LENS  IMPLANT, BILATERAL  2013   CORONARY ANGIOPLASTY WITH STENT PLACEMENT  04/07/2012   Xience Expedition DES 2.74m x 18 mm (dilated to 3.0 mm) to the prox LAD    CORONARY ANGIOPLASTY WITH STENT PLACEMENT  2013; 2015   CORONARY ARTERY BYPASS GRAFT N/A 08/17/2017   Procedure: CORONARY ARTERY BYPASS GRAFTING (CABG) TIMES 1 USING LEFT INTERNAL MAMMARY ARTERY;  Surgeon: HMelrose Nakayama MD;   Location: [facility]  Service: Open Heart Surgery;  Laterality: N/A;   DILATION AND CURETTAGE OF UTERUS  <1990   ESOPHAGOGASTRODUODENOSCOPY N/A 05/23/2015   Procedure: ESOPHAGOGASTRODUODENOSCOPY (EGD);  Surgeon: PCarol Ada MD;  [facility];  Service: Endoscopy;  Laterality: N/A;   ESOPHAGOGASTRODUODENOSCOPY (EGD) WITH PROPOFOL N/A 06/13/2015   Procedure: ESOPHAGOGASTRODUODENOSCOPY (EGD) WITH PROPOFOL;  Surgeon: PCarol Ada MD;  [facility];  Service: Endoscopy;  Laterality: N/A;   ESOPHAGOGASTRODUODENOSCOPY (EGD) WITH PROPOFOL N/A 04/07/2018   Procedure: ESOPHAGOGASTRODUODENOSCOPY (EGD) WITH PROPOFOL;  Surgeon: HCarol Ada MD;  [facility];  Service: Endoscopy;  Laterality: N/A;  fluoroscopy is needed   FRACTURE SURGERY     HAMMER TOE SURGERY Bilateral 1993   with screws, on screw removed   JOINT REPLACEMENT     KNEE ARTHROSCOPY Bilateral    left x2, right x1   LAPAROSCOPIC CHOLECYSTECTOMY     LEFT HEART CATH AND CORONARY ANGIOGRAPHY N/A 08/11/2017   Procedure: LEFT HEART CATH AND CORONARY ANGIOGRAPHY;  Surgeon: JMartinique Peter M, MD;  [facility];  Service: Cardiovascular;  Laterality: N/A;   LEFT HEART CATHETERIZATION WITH CORONARY ANGIOGRAM N/A 04/07/2012   Procedure:  LEFT HEART CATHETERIZATION WITH CORONARY ANGIOGRAM;  Surgeon: [name], MD;  Location: [facility] CATH LAB;  Service: Cardiovascular;  Laterality: N/A;   LEFT HEART CATHETERIZATION WITH CORONARY ANGIOGRAM N/A 05/27/2014   Procedure: LEFT HEART CATHETERIZATION WITH CORONARY ANGIOGRAM;  Surgeon: [name], MD; LAD 99% ISR, CFX 50-60%, RCA (dominant) no sig dz, EF 60%   LOWER EXTREMITY ANGIOGRAM  05/17/11   directional atherectomy to the prox L SFA using a LX Man TurboHawk, ballooned with a [place] balloon    ORIF ANKLE FRACTURE  07/09/2012   Procedure: OPEN REDUCTION INTERNAL FIXATION (ORIF) ANKLE FRACTURE;  Surgeon: Meredith Pel, MD;  [facility];  Service:  Orthopedics;  Laterality: Left;  open reduction internal fixation trimalleolar ankle fracture medial malleolous fixation   PERCUTANEOUS CORONARY STENT INTERVENTION (PCI-S) N/A 04/07/2012   Procedure: PERCUTANEOUS CORONARY STENT INTERVENTION (PCI-S);  Surgeon: [name], MD;  [facility];  Service: Cardiovascular;  Laterality: N/A;   PERCUTANEOUS CORONARY STENT INTERVENTION (PCI-S)  05/27/2014   Procedure: PERCUTANEOUS CORONARY STENT INTERVENTION (PCI-S);  Surgeon: [name], MD; 3 mm x 12 mm long Xience Xpedition DES to the proximal LAD   SAVORY DILATION N/A 06/13/2015   Procedure: SAVORY DILATION;  Surgeon: Carol Ada, MD;  [facility];  Service: Endoscopy;  Laterality: N/A;   SAVORY DILATION N/A 04/07/2018   Procedure: SAVORY DILATION;  Surgeon: Carol Ada, MD;  [facility];  Service: Endoscopy;  Laterality: N/A;   TEE WITHOUT CARDIOVERSION N/A 08/17/2017   Procedure: TRANSESOPHAGEAL ECHOCARDIOGRAM (TEE);  Surgeon: Melrose Nakayama, MD;  Location: [facility];  Service: Open Heart Surgery;  Laterality: N/A;   TONSILLECTOMY     TOTAL KNEE ARTHROPLASTY Left 2003   TOTAL KNEE REVISION Left 11/05/2013   Procedure: LEFT TOTAL KNEE RESECTION;  Surgeon: Mauri Pole, MD;  [facility];  Service: Orthopedics;  Laterality: Left;   TOTAL KNEE REVISION Left 2007   opened in 2006 and cleaned out, 2007 revision   TOTAL KNEE REVISION Left 12/31/2013   Procedure: RE-INPLANTATION LEFT TOTAL KNEE ;  Surgeon: Mauri Pole, MD;  [facility];  Service: Orthopedics;  Laterality: Left;   VIDEO ASSISTED THORACOSCOPY (VATS)/ LOBECTOMY  01/2009     OB History     Gravida  1   Para  1   Term      Preterm      AB      Living  1      SAB      IAB      Ectopic      Multiple      Live Births              Family History  Problem Relation Age of Onset   Hypertension Mother    Coronary artery disease Brother    Hypertension Sister      Social History   Tobacco Use   Smoking status: Former    Packs/day: 1.00    Years: 20.00    Pack years: 20.00    Types: Cigarettes    Quit date: 09/27/1984    Years since quitting: 36.8   Smokeless tobacco: Never  Vaping Use   Vaping Use: Never used  Substance Use Topics   Alcohol use: No   Drug use: No    Home Medications Prior to Admission medications   Medication Sig Start Date End Date Taking? Authorizing Provider  furosemide (LASIX) 40 MG tablet Take 1  tablet (40 mg total) by mouth daily as needed for edema (For a weight over 295 lbs). 12/18/20  Yes Croitoru, Mihai, MD  insulin NPH-regular Human (70-30) 100 UNIT/ML injection Inject 20-62 Units into the skin 2 (two) times daily with a meal. 62 units in the morning and 20 units at bedtme   Yes [provider]  lisinopril-hydrochlorothiazide (PRINZIDE,ZESTORETIC) 20-25 MG per tablet Take 1 tablet by mouth daily.   Yes [provider]  metoCLOPramide (REGLAN) 10 MG tablet Take 10 mg by mouth 2 (two) times daily. Per patient   Yes [provider]  metoprolol tartrate (LOPRESSOR) 50 MG tablet 50 mg in the morning and 25 mg at bedtime 05/12/21  Yes Croitoru, Mihai, MD  pantoprazole (PROTONIX) 40 MG tablet Take 1 tablet (40 mg total) by mouth daily. 06/17/21  Yes Croitoru, Mihai, MD  simvastatin (ZOCOR) 40 MG tablet Take 1 tablet (40 mg total) by mouth daily at 6 PM. 07/09/21  Yes Croitoru, Mihai, MD  spironolactone (ALDACTONE) 25 MG tablet Take 1 tablet (25 mg total) by mouth daily as needed (For a weight over 295 lbs). 12/18/20  Yes Croitoru, Mihai, MD  Vitamin D, Cholecalciferol, 1000 units TABS Take 1,000 Units by mouth every morning.    Yes [provider]  warfarin (COUMADIN) 7.5 MG tablet TAKE 1/2 TO 1 TABLET DAILY AS DIRECTED BY COUMADIN CLINIC Patient taking differently: Take 3.75-7.5 mg by mouth as directed. TAKE 1 TABLET (7.5 MG) ON (TUES & THURS) & TAKE 1/2 TABLET (3.75 MG) ALL OTHER DAYS AS  DIRECTED BY COUMADIN CLINIC 05/26/21  Yes Cleaver, Jossie Ng, NP  Blood Glucose Monitoring Suppl ([place]) w/Device KIT  07/23/20   [provider]  Continuous Blood Gluc Receiver (FREESTYLE LIBRE 2 READER) [place] See admin instructions. 11/18/20   [provider]  Lancets (ONETOUCH DELICA PLUS [id number]) [place] Apply topically. 08/13/20   [provider]  Glory Rosebush VERIO test strip 1 each 2 (two) times daily. 10/11/20   [provider]  simvastatin (ZOCOR) 40 MG tablet Take 1 tablet (40 mg total) by mouth at bedtime. 07/09/21 10/07/21  Croitoru, Mihai, MD    Allergies    Levofloxacin and Morphine and related  Review of Systems   Review of Systems  Constitutional:  Negative for diaphoresis and malaise/fatigue.  Respiratory:  Negative for cough, hemoptysis and shortness of breath.   Cardiovascular:  Positive for palpitations. Negative for chest pain, orthopnea, syncope, PND and near-syncope.  Gastrointestinal:  Negative for nausea and vomiting.  Musculoskeletal:  Negative for back pain.  Neurological:  Negative for dizziness, weakness and numbness.  Ten systems reviewed and are negative for acute change, except as noted in the HPI.   Physical Exam Updated Vital Signs BP (!) 168/88   Pulse (!) 121   Temp 98 F (36.7 C) (Oral)   Resp (!) 23   SpO2 100%   Physical Exam Vitals and nursing note reviewed.  Constitutional:      General: She is not in acute distress.    Appearance: She is well-developed. She is not diaphoretic.  HENT:     Head: Normocephalic and atraumatic.     Right Ear: External ear normal.     Left Ear: External ear normal.     Nose: Nose normal.     Mouth/Throat:     Mouth: Mucous membranes are moist.  Eyes:     General: No scleral icterus.    Conjunctiva/sclera: Conjunctivae normal.  Cardiovascular:  Rate and Rhythm: Regular rhythm. Tachycardia present.     Heart sounds: Normal heart sounds. No murmur heard.    No friction rub. No gallop.  Pulmonary:     Effort: Pulmonary effort is normal. No respiratory distress.     Breath sounds: Normal breath sounds.  Abdominal:     General: Bowel sounds are normal. There is no distension.     Palpations: Abdomen is soft. There is no mass.     Tenderness: There is no abdominal tenderness. There is no guarding.  Musculoskeletal:     Cervical back: Normal range of motion.     Right lower leg: Edema present.     Left lower leg: Edema present.     Comments: Bilateral peripheral edema, mild erythema, ulcer on the right medial lower extremity, scaly skin, mild erythema, mild tenderness  Skin:    General: Skin is warm and dry.  Neurological:     Mental Status: She is alert and oriented to person, place, and time.  Psychiatric:        Behavior: Behavior normal.    ED Results / Procedures / Treatments   Labs (all labs ordered are listed, but only abnormal results are displayed) Labs Reviewed  BASIC METABOLIC PANEL - Abnormal; Notable for the following components:      Result Value   Glucose, Bld 214 (*)    Creatinine, Ser 1.15 (*)    GFR, Estimated 51 (*)    All other components within normal limits  CBC  TSH  BRAIN NATRIURETIC PEPTIDE  PROTIME-INR  TROPONIN I (HIGH SENSITIVITY)  TROPONIN I (HIGH SENSITIVITY)    EKG None  Radiology DG Chest 2 View  Result Date: 07/14/2021 CLINICAL DATA:  70 year old female with tachycardia. EXAM: CHEST - 2 VIEW COMPARISON:  Recent chest CT 07/07/2021 and earlier. FINDINGS: Semi upright AP and lateral views of the chest. Prior sternotomy, CABG. Stable cardiomegaly and mediastinal contours. Stable chronically somewhat low lung volumes and increased pulmonary interstitial markings. Previous left upper lobectomy. No pneumothorax, pleural effusion, consolidation, or acute pulmonary opacity identified. No acute osseous abnormality identified. Negative visible bowel gas pattern. IMPRESSION: No acute cardiopulmonary  abnormality. Chronic cardiomegaly, prior left upper lobectomy. Electronically Signed   By: Genevie Ann M.D.   On: 07/14/2021 07:40    Procedures Procedures   Medications Ordered in ED Medications  sodium chloride 0.9 % bolus 1,000 mL (1,000 mLs Intravenous New Bag/Given 07/14/21 1416)    ED Course  I have reviewed the triage vital signs and the nursing notes.  Pertinent labs & imaging results that were available during my care of the patient were reviewed by me and considered in my medical decision making (see chart for details).    MDM Rules/Calculators/A&P                          Patient here with palpitations and racing heart. The differential diagnosis for palpitations includes cardiac arrhythmias, PVC/PAC, ACS, Cardiomyopathy, CHF, MVP, pericarditis, valvular disease, Panic/Anxiety, Somatic disorder, ETOH, Caffeine,  Stimulant use, medication side effect, Anemia, Hyperthyroidism, pulmonary embolism.  I reviewed labs ordered in triage that includes Cbc, troponin x2 WNL BMP- mild hyperglycemia and elevated creatinine PT/INR subtherapeutic  EKG shows sinus tachycardia  I have ordered a BNP, D-Dimer and TSH which are currently pending.   CXR shows no acute abnormalities  Fluids ordered.  Sign out at shift change to Franchot Heidelberg, PA-C Final Clinical Impression(s) / ED Diagnoses Final diagnoses:  None    Rx / DC Orders ED Discharge Orders     None        Margarita Mail, PA-C 07/14/21 1614    Valarie Merino, MD 07/19/21 [phone]

## 2021-07-14 NOTE — ED Notes (Signed)
Pt ambulated in the hallway with walker.  Denies SOB. HR remained about 120 bpm.

## 2021-07-14 NOTE — ED Triage Notes (Signed)
Pt reports her heart has been racing for an hour. No symptoms or chest pain at this time.

## 2021-07-14 NOTE — ED Provider Notes (Signed)
  Physical Exam  BP (!) 156/79   Pulse (!) 102   Temp 98 F (36.7 C) (Oral)   Resp (!) 25   SpO2 100%   Physical Exam Vitals and nursing note reviewed.  Constitutional:      General: She is not in acute distress.    Appearance: She is well-developed. She is obese.  HENT:     Head: Normocephalic and atraumatic.  Eyes:     Extraocular Movements: Extraocular movements intact.  Cardiovascular:     Rate and Rhythm: Tachycardia present. Rhythm irregular.     Comments: Tachycardic and irregularly irregular.  Pulmonary:     Effort: Pulmonary effort is normal.  Abdominal:     General: There is no distension.  Musculoskeletal:        General: Normal range of motion.     Cervical back: Normal range of motion.  Skin:    General: Skin is warm.     Findings: No rash.  Neurological:     Mental Status: She is alert and oriented to person, place, and time.    ED Course/Procedures     Procedures  MDM   Pt signed out to me by A Harris, PA-C.  Please see previous notes for further history.  A brief, patient presented for evaluation of 1 hour of palpitations prior to arrival.  Symptoms resolved before evaluation.  She remains tachycardic, however states she feels fine.  No dizziness, lightheadedness, chest pain, shortness of breath, nausea, vomiting.  Lab work so far shows that she is subtherapeutic on her INR.  She does have a history of A. fib with RVR.  Labs interpreted by me, shows mildly elevated BNP but not far from baseline.  Dimer negative.  TSH is normal.  Troponin is negative x2.  Chest x-ray viewed and independently turbid by me, no pneumonia pneumothorax, effusion.  On reevaluation, patient states she is feeling well.  Heart rate in the low 100s, however when she talks it goes up to 120.  However then she states she has not taken her metoprolol or any of her blood pressure medications today.  Will give her home medications and reassess.  On reassessment, patient remains  tachycardic.  Will call for admission.  Discussed with hospitalist, who recommends cardiology consult.  Patient ambulated, heart rate remained in the 120s with ambulation.  At rest, heart rate slightly improved in the upper 90s, however patient still does not feel comfortable going home considering her high heart rate.  Discussed with Dr. Margaretann Loveless from cardiology, they will admit the pt.       Franchot Heidelberg, PA-C 07/14/21 2005    Regan Lemming, MD 07/14/21 2012

## 2021-07-15 ENCOUNTER — Observation Stay (HOSPITAL_BASED_OUTPATIENT_CLINIC_OR_DEPARTMENT_OTHER): Payer: HMO

## 2021-07-15 ENCOUNTER — Other Ambulatory Visit (HOSPITAL_COMMUNITY): Payer: HMO

## 2021-07-15 ENCOUNTER — Other Ambulatory Visit: Payer: Self-pay

## 2021-07-15 DIAGNOSIS — I429 Cardiomyopathy, unspecified: Secondary | ICD-10-CM | POA: Diagnosis not present

## 2021-07-15 DIAGNOSIS — I484 Atypical atrial flutter: Secondary | ICD-10-CM | POA: Diagnosis not present

## 2021-07-15 DIAGNOSIS — I4891 Unspecified atrial fibrillation: Secondary | ICD-10-CM | POA: Diagnosis not present

## 2021-07-15 LAB — ECHOCARDIOGRAM COMPLETE
AR max vel: 2.3 cm2
AV Area VTI: 2.5 cm2
AV Area mean vel: 2.02 cm2
AV Mean grad: 3 mmHg
AV Peak grad: 4.9 mmHg
Ao pk vel: 1.11 m/s
Height: 63 in
S' Lateral: 3.2 cm
Weight: 4576 oz

## 2021-07-15 LAB — CBG MONITORING, ED
Glucose-Capillary: 183 mg/dL — ABNORMAL HIGH (ref 70–99)
Glucose-Capillary: 219 mg/dL — ABNORMAL HIGH (ref 70–99)

## 2021-07-15 LAB — COMPREHENSIVE METABOLIC PANEL
ALT: 26 U/L (ref 0–44)
AST: 25 U/L (ref 15–41)
Albumin: 2.9 g/dL — ABNORMAL LOW (ref 3.5–5.0)
Alkaline Phosphatase: 169 U/L — ABNORMAL HIGH (ref 38–126)
Anion gap: 11 (ref 5–15)
BUN: 14 mg/dL (ref 8–23)
CO2: 24 mmol/L (ref 22–32)
Calcium: 9.5 mg/dL (ref 8.9–10.3)
Chloride: 99 mmol/L (ref 98–111)
Creatinine, Ser: 1.1 mg/dL — ABNORMAL HIGH (ref 0.44–1.00)
GFR, Estimated: 54 mL/min — ABNORMAL LOW (ref >60)
Glucose, Bld: 195 mg/dL — ABNORMAL HIGH (ref 70–99)
Potassium: 3.9 mmol/L (ref 3.5–5.1)
Sodium: 134 mmol/L — ABNORMAL LOW (ref 135–145)
Total Bilirubin: 1.1 mg/dL (ref 0.3–1.2)
Total Protein: 7.1 g/dL (ref 6.5–8.1)

## 2021-07-15 LAB — CBC
HCT: 34.6 % — ABNORMAL LOW (ref 36.0–46.0)
Hemoglobin: 10.9 g/dL — ABNORMAL LOW (ref 12.0–15.0)
MCH: 27.9 pg (ref 26.0–34.0)
MCHC: 31.5 g/dL (ref 30.0–36.0)
MCV: 88.7 fL (ref 80.0–100.0)
Platelets: 226 10*3/uL (ref 150–400)
RBC: 3.9 MIL/uL (ref 3.87–5.11)
RDW: 13.4 % (ref 11.5–15.5)
WBC: 8.6 10*3/uL (ref 4.0–10.5)
nRBC: 0 % (ref 0.0–0.2)

## 2021-07-15 LAB — GLUCOSE, CAPILLARY
Glucose-Capillary: 100 mg/dL — ABNORMAL HIGH (ref 70–99)
Glucose-Capillary: 240 mg/dL — ABNORMAL HIGH (ref 70–99)

## 2021-07-15 LAB — LIPID PANEL
Cholesterol: 115 mg/dL (ref 0–200)
HDL: 44 mg/dL (ref >40)
LDL Cholesterol: 59 mg/dL (ref 0–99)
Total CHOL/HDL Ratio: 2.6 RATIO
Triglycerides: 59 mg/dL (ref <150)
VLDL: 12 mg/dL (ref 0–40)

## 2021-07-15 LAB — PROTIME-INR
INR: 1.7 — ABNORMAL HIGH (ref 0.8–1.2)
Prothrombin Time: 19.9 seconds — ABNORMAL HIGH (ref 11.4–15.2)

## 2021-07-15 MED ORDER — WARFARIN SODIUM 7.5 MG PO TABS
7.5000 mg | ORAL_TABLET | Freq: Once | ORAL | Status: AC
  Administered 2021-07-15: 7.5 mg via ORAL
  Filled 2021-07-15 (×2): qty 1

## 2021-07-15 MED ORDER — INFLUENZA VAC A&B SA ADJ QUAD 0.5 ML IM PRSY
0.5000 mL | PREFILLED_SYRINGE | INTRAMUSCULAR | Status: AC
  Administered 2021-07-17: 0.5 mL via INTRAMUSCULAR
  Filled 2021-07-15 (×2): qty 0.5

## 2021-07-15 MED ORDER — SODIUM CHLORIDE 0.9 % IV SOLN: INTRAVENOUS | Status: DC

## 2021-07-15 MED ORDER — PERFLUTREN LIPID MICROSPHERE
1.0000 mL | INTRAVENOUS | Status: AC | PRN
  Administered 2021-07-15: 4 mL via INTRAVENOUS
  Filled 2021-07-15: qty 10

## 2021-07-15 MED ORDER — ENOXAPARIN SODIUM 120 MG/0.8ML IJ SOSY
120.0000 mg | PREFILLED_SYRINGE | Freq: Two times a day (BID) | INTRAMUSCULAR | Status: DC
  Administered 2021-07-15 – 2021-07-17 (×3): 120 mg via SUBCUTANEOUS
  Filled 2021-07-15 (×9): qty 0.8

## 2021-07-15 NOTE — Progress Notes (Signed)
  Echocardiogram 2D Echocardiogram has been performed.  [name] 07/15/2021, 10:31 AM

## 2021-07-15 NOTE — ED Notes (Signed)
Lunch ordered 

## 2021-07-15 NOTE — Progress Notes (Signed)
ANTICOAGULATION CONSULT NOTE - Initial Consult  Pharmacy Consult for warfarin + therapeutic enoxaparin  Indication: atrial fibrillation   Allergies  Allergen Reactions   Levofloxacin Itching   Morphine And Related Itching    Pt reports oxycodone history with no allergy.    Vital Signs: Temp: 98.7 F (37.1 C) (10/19 0600) Temp Source: Oral (10/19 0600) BP: 112/52 (10/19 1030) Pulse Rate: 104 (10/19 1030)  Labs: Recent Labs    07/14/21 0710 07/14/21 1322 07/14/21 1410 07/15/21 0348  HGB 13.0  --   --  10.9*  HCT 41.5  --   --  34.6*  PLT 219  --   --  226  LABPROT  --   --  18.1* 19.9*  INR  --   --  1.5* 1.7*  CREATININE 1.15*  --   --  1.10*  TROPONINIHS 7 10  --   --      Estimated Creatinine Clearance: 61.7 mL/min (A) (by C-G formula based on SCr of 1.1 mg/dL (H)).   Medical History: Past Medical History:  Diagnosis Date   Arthritis    "knees" (02/05/2016)   Cancer ([place]) 5/10   LUL Vats    CHF (congestive heart failure) ([place])    Coronary artery disease    GERD (gastroesophageal reflux disease)    tx meds   H/O hiatal hernia    History of blood transfusion "several"   last april 2015 s/p knee surgery (02/05/2016)   Hypercholesteremia    Hypertension    Lung cancer ([place])    2010, surgery 18% left lung no radiation or chemo   Non-STEMI (non-ST elevated myocardial infarction) ([place]) 04/07/2012   See cath results below   Paroxysmal atrial fibrillation ([place]) 04/07/2012   On Warfarin   Peripheral vascular disease ([place]) 8/12   Lt SFA PTA   Presence of stent in LAD coronary artery 04/07/2012   Xience Expedition DES 2.75 mm x 18 mm (dilated to 3.0 mm)   Sleep apnea    does not wear CPAP   Type II diabetes mellitus (HCC)    insulin dependent    Medications:  (Not in a hospital admission) Scheduled:   cholecalciferol  1,000 Units Oral BH-q7a   enoxaparin (LOVENOX) injection  120 mg Subcutaneous BID   hydrochlorothiazide  25 mg Oral Daily   insulin aspart   0-15 Units Subcutaneous TID WC   insulin aspart  0-5 Units Subcutaneous QHS   insulin aspart protamine- aspart  20 Units Subcutaneous Q supper   insulin aspart protamine- aspart  62 Units Subcutaneous Q breakfast   metoCLOPramide  10 mg Oral BID   metoprolol tartrate  50 mg Oral q morning   pantoprazole sodium  40 mg Oral Daily   simvastatin  40 mg Oral q1800   sodium chloride flush  3 mL Intravenous Q12H   Warfarin - Pharmacist Dosing Inpatient   Does not apply q1600   Infusions:   sodium chloride      Assessment: Patient presents with tachycardia (HR up to 121) but no chest pain. On warfarin PTA for a.fib. 7.5 mg T and Th, 3.75 mg all other days. Last dose 10/18 at 2000.   Contacted by Dr Harrell Gave with Cardiology service to convert enoxaparin 40mg  SQ to therapeutic dosing for bridging, INR remains subtherapeutic at 1.7. Plan to do TEE-CV tomorrow. CBC wnl. No s/sx of bleeding.  Goal of Therapy: INR 2-3 Monitor platelets by anticoagulation protocol: Yes   Plan: Give warfarin 7.5mg  today x1 dose  to give the 10mg  from yesterday time to catch up and to avoid overshooting.  Start lovenox 120mg  SubQ BID for bridging. F/u daily INR and CBC  Joetta Manners, PharmD, [facility] Emergency Medicine Clinical Pharmacist ED RPh Phone: [place]: [phone]

## 2021-07-15 NOTE — Care Management Obs Status (Signed)
[facility] NOTIFICATION   Patient Details  Name: [name] MRN: [id number] Date of Birth: 03-23-1951   Medicare Observation Status Notification Given:  Yes    Carles Collet, RN 07/15/2021, 5:14 PM

## 2021-07-15 NOTE — ED Notes (Signed)
Breakfast Order placed ?

## 2021-07-15 NOTE — Progress Notes (Signed)
Progress Note  Patient Name: [name] Date of Encounter: 07/15/2021  [facility] Cardiologist: Sanda Klein, MD   Subjective   Remains in atrial flutter, rates 80s-120s. She is asymptomatic. We discussed options for management, see below.  Inpatient Medications    Scheduled Meds:  cholecalciferol  1,000 Units Oral BH-q7a   enoxaparin (LOVENOX) injection  120 mg Subcutaneous BID   hydrochlorothiazide  25 mg Oral Daily   insulin aspart  0-15 Units Subcutaneous TID WC   insulin aspart  0-5 Units Subcutaneous QHS   insulin aspart protamine- aspart  20 Units Subcutaneous Q supper   insulin aspart protamine- aspart  62 Units Subcutaneous Q breakfast   metoCLOPramide  10 mg Oral BID   metoprolol tartrate  50 mg Oral q morning   pantoprazole sodium  40 mg Oral Daily   simvastatin  40 mg Oral q1800   sodium chloride flush  3 mL Intravenous Q12H   Warfarin - Pharmacist Dosing Inpatient   Does not apply q1600   Continuous Infusions:  sodium chloride     PRN Meds: sodium chloride, acetaminophen, ALPRAZolam, nitroGLYCERIN, ondansetron (ZOFRAN) IV, sodium chloride flush, spironolactone, zolpidem   Vital Signs    Vitals:   07/15/21 0819 07/15/21 0900 07/15/21 1000 07/15/21 1030  BP: 129/62 (!) 111/57 108/63 (!) 112/52  Pulse: (!) 124 (!) 124 (!) 102 (!) 104  Resp: (!) 22 (!) 25 (!) 28 (!) 26  Temp:      TempSrc:      SpO2: 96% 97% 100% 98%  Weight: 129.7 kg     Height: 5\' 3"  (1.6 m)       Intake/Output Summary (Last 24 hours) at 07/15/2021 1235 Last data filed at 07/14/2021 1620 Gross per 24 hour  Intake 1000 ml  Output --  Net 1000 ml   Last 3 Weights 07/15/2021 12/18/2020 12/05/2020  Weight (lbs) 286 lb 293 lb 298 lb 4.8 oz  Weight (kg) 129.729 kg 132.904 kg 135.308 kg      Telemetry    Rates 100s-brief 130s - Personally Reviewed  ECG    Atrial flutter, RBBB - Personally Reviewed  Physical Exam   GEN: No acute distress.   Neck: No JVD  apparent but difficult body habitus Cardiac: RRR, no murmurs, rubs, or gallops.  Respiratory: Clear to auscultation bilaterally. GI: Soft, nontender, non-distended  MS: Chronic LE skin changes, mild bilateral LE edema; No deformity. Neuro:  Nonfocal  Psych: Normal affect   Labs    High Sensitivity Troponin:   Recent Labs  Lab 07/14/21 0710 07/14/21 1322  TROPONINIHS 7 10     Chemistry Recent Labs  Lab 07/14/21 0710 07/15/21 0348  NA 136 134*  K 3.5 3.9  CL 99 99  CO2 24 24  GLUCOSE 214* 195*  BUN 16 14  CREATININE 1.15* 1.10*  CALCIUM 10.0 9.5  PROT  --  7.1  ALBUMIN  --  2.9*  AST  --  25  ALT  --  26  ALKPHOS  --  169*  BILITOT  --  1.1  GFRNONAA 51* 54*  ANIONGAP 13 11    Lipids  Recent Labs  Lab 07/15/21 0348  CHOL 115  TRIG 59  HDL 44  LDLCALC 59  CHOLHDL 2.6    Hematology Recent Labs  Lab 07/14/21 0710 07/15/21 0348  WBC 8.3 8.6  RBC 4.58 3.90  HGB 13.0 10.9*  HCT 41.5 34.6*  MCV 90.6 88.7  MCH 28.4 27.9  MCHC 31.3  31.5  RDW 13.2 13.4  PLT 219 226   Thyroid  Recent Labs  Lab 07/14/21 1410  TSH 2.181    BNP Recent Labs  Lab 07/14/21 1410  BNP 163.5*    DDimer  Recent Labs  Lab 07/14/21 1610  DDIMER 0.34     Radiology    DG Chest 2 View  Result Date: 07/14/2021 CLINICAL DATA:  70 year old female with tachycardia. EXAM: CHEST - 2 VIEW COMPARISON:  Recent chest CT 07/07/2021 and earlier. FINDINGS: Semi upright AP and lateral views of the chest. Prior sternotomy, CABG. Stable cardiomegaly and mediastinal contours. Stable chronically somewhat low lung volumes and increased pulmonary interstitial markings. Previous left upper lobectomy. No pneumothorax, pleural effusion, consolidation, or acute pulmonary opacity identified. No acute osseous abnormality identified. Negative visible bowel gas pattern. IMPRESSION: No acute cardiopulmonary abnormality. Chronic cardiomegaly, prior left upper lobectomy. Electronically Signed   By: Genevie Ann M.D.   On: 07/14/2021 07:40    Cardiac Studies   Echo pending today  Patient Profile     70 y.o. female with history of CAD s/p CABG, PAD, HFpEF, PAF who presented with atrial flutter  Assessment & Plan    Atrial flutter -chadsvasc=6 -subtherapeutic INR on presentation, INR 1.7 today -we discussed options. I personally reviewed her echo (formal read pending); technically challenging, but definity images suggest that EF slightly reduced compared to prior -we discussed rate control, close INR monitoring, and outpatient cardioversion vs. Inpatient TEE-CV with bridging with lovenox. After shared decision making, we will proceed with TEE-CV tomorrow -she has had esophageal dilation in the past, not in years, and no issues with food getting stuck. Had TEE for CABG in 2018 without issue -appreciate pharmacy assistance, will dose with lovenox to bridge INR -NPO at midnight  Shared Decision Making/Informed Consent The risks [stroke, cardiac arrhythmias rarely resulting in the need for a temporary or permanent pacemaker, skin irritation or burns, esophageal damage, perforation (1:10,000 risk), bleeding, pharyngeal hematoma as well as other potential complications associated with conscious sedation including aspiration, arrhythmia, respiratory failure and death], benefits (treatment guidance, restoration of normal sinus rhythm, diagnostic support) and alternatives of a transesophageal echocardiogram guided cardioversion were discussed in detail with Ms. Goodness and she is willing to proceed.   For questions or updates, please contact [place] Please consult [email or web address] for contact info under        Signed, Buford Dresser, MD  07/15/2021, 12:36 PM

## 2021-07-16 ENCOUNTER — Encounter (HOSPITAL_COMMUNITY)
Admission: EM | Disposition: A | Discharge status: Home / Self Care | Payer: Self-pay | Source: Home / Self Care | Attending: Emergency Medicine

## 2021-07-16 ENCOUNTER — Encounter (HOSPITAL_COMMUNITY): Payer: Self-pay | Admitting: Internal Medicine

## 2021-07-16 ENCOUNTER — Observation Stay (HOSPITAL_COMMUNITY): Payer: HMO | Admitting: Anesthesiology

## 2021-07-16 ENCOUNTER — Observation Stay (HOSPITAL_BASED_OUTPATIENT_CLINIC_OR_DEPARTMENT_OTHER): Payer: HMO

## 2021-07-16 DIAGNOSIS — I361 Nonrheumatic tricuspid (valve) insufficiency: Secondary | ICD-10-CM

## 2021-07-16 DIAGNOSIS — I130 Hypertensive heart and chronic kidney disease with heart failure and stage 1 through stage 4 chronic kidney disease, or unspecified chronic kidney disease: Secondary | ICD-10-CM | POA: Diagnosis not present

## 2021-07-16 DIAGNOSIS — Z951 Presence of aortocoronary bypass graft: Secondary | ICD-10-CM | POA: Diagnosis not present

## 2021-07-16 DIAGNOSIS — N183 Chronic kidney disease, stage 3 unspecified: Secondary | ICD-10-CM | POA: Diagnosis not present

## 2021-07-16 DIAGNOSIS — Z96652 Presence of left artificial knee joint: Secondary | ICD-10-CM | POA: Diagnosis not present

## 2021-07-16 DIAGNOSIS — Z794 Long term (current) use of insulin: Secondary | ICD-10-CM | POA: Diagnosis not present

## 2021-07-16 DIAGNOSIS — I484 Atypical atrial flutter: Secondary | ICD-10-CM | POA: Diagnosis not present

## 2021-07-16 DIAGNOSIS — Z23 Encounter for immunization: Secondary | ICD-10-CM | POA: Diagnosis not present

## 2021-07-16 DIAGNOSIS — I429 Cardiomyopathy, unspecified: Secondary | ICD-10-CM | POA: Diagnosis not present

## 2021-07-16 DIAGNOSIS — E119 Type 2 diabetes mellitus without complications: Secondary | ICD-10-CM | POA: Diagnosis not present

## 2021-07-16 DIAGNOSIS — I4892 Unspecified atrial flutter: Secondary | ICD-10-CM

## 2021-07-16 DIAGNOSIS — I2510 Atherosclerotic heart disease of native coronary artery without angina pectoris: Secondary | ICD-10-CM | POA: Diagnosis not present

## 2021-07-16 DIAGNOSIS — I5032 Chronic diastolic (congestive) heart failure: Secondary | ICD-10-CM | POA: Diagnosis not present

## 2021-07-16 DIAGNOSIS — I480 Paroxysmal atrial fibrillation: Secondary | ICD-10-CM | POA: Diagnosis not present

## 2021-07-16 DIAGNOSIS — Z20822 Contact with and (suspected) exposure to covid-19: Secondary | ICD-10-CM | POA: Diagnosis not present

## 2021-07-16 DIAGNOSIS — J449 Chronic obstructive pulmonary disease, unspecified: Secondary | ICD-10-CM | POA: Diagnosis not present

## 2021-07-16 DIAGNOSIS — I081 Rheumatic disorders of both mitral and tricuspid valves: Secondary | ICD-10-CM | POA: Diagnosis not present

## 2021-07-16 HISTORY — PX: TEE WITHOUT CARDIOVERSION: SHX5443

## 2021-07-16 HISTORY — PX: CARDIOVERSION: SHX1299

## 2021-07-16 LAB — CBC
HCT: 33.1 % — ABNORMAL LOW (ref 36.0–46.0)
Hemoglobin: 10.9 g/dL — ABNORMAL LOW (ref 12.0–15.0)
MCH: 28.7 pg (ref 26.0–34.0)
MCHC: 32.9 g/dL (ref 30.0–36.0)
MCV: 87.1 fL (ref 80.0–100.0)
Platelets: 226 10*3/uL (ref 150–400)
RBC: 3.8 MIL/uL — ABNORMAL LOW (ref 3.87–5.11)
RDW: 13.4 % (ref 11.5–15.5)
WBC: 8.5 10*3/uL (ref 4.0–10.5)
nRBC: 0 % (ref 0.0–0.2)

## 2021-07-16 LAB — PROTIME-INR
INR: 2 — ABNORMAL HIGH (ref 0.8–1.2)
Prothrombin Time: 22.8 seconds — ABNORMAL HIGH (ref 11.4–15.2)

## 2021-07-16 LAB — HEMOGLOBIN A1C
Hgb A1c MFr Bld: 8.9 % — ABNORMAL HIGH (ref 4.8–5.6)
Mean Plasma Glucose: 208.73 mg/dL

## 2021-07-16 LAB — GLUCOSE, CAPILLARY
Glucose-Capillary: 163 mg/dL — ABNORMAL HIGH (ref 70–99)
Glucose-Capillary: 172 mg/dL — ABNORMAL HIGH (ref 70–99)
Glucose-Capillary: 197 mg/dL — ABNORMAL HIGH (ref 70–99)
Glucose-Capillary: 195 mg/dL — ABNORMAL HIGH (ref 70–99)
Glucose-Capillary: 260 mg/dL — ABNORMAL HIGH (ref 70–99)

## 2021-07-16 SURGERY — ECHOCARDIOGRAM, TRANSESOPHAGEAL
Anesthesia: Monitor Anesthesia Care

## 2021-07-16 MED ORDER — WARFARIN SODIUM 2.5 MG PO TABS
3.7500 mg | ORAL_TABLET | Freq: Once | ORAL | Status: AC
  Administered 2021-07-16: 3.75 mg via ORAL
  Filled 2021-07-16 (×2): qty 1

## 2021-07-16 MED ORDER — PROPOFOL 10 MG/ML IV BOLUS
INTRAVENOUS | Status: DC | PRN
  Administered 2021-07-16: 20 mg via INTRAVENOUS

## 2021-07-16 MED ORDER — HYDROMORPHONE HCL 1 MG/ML IJ SOLN: 0.2500 mg | INTRAMUSCULAR | Status: DC | PRN

## 2021-07-16 MED ORDER — PROPOFOL 500 MG/50ML IV EMUL
INTRAVENOUS | Status: DC | PRN
  Administered 2021-07-16: 100 ug/kg/min via INTRAVENOUS

## 2021-07-16 MED ORDER — OXYCODONE HCL 5 MG/5ML PO SOLN: 5.0000 mg | Freq: Once | ORAL | Status: DC | PRN

## 2021-07-16 MED ORDER — OXYCODONE HCL 5 MG PO TABS: 5.0000 mg | ORAL_TABLET | Freq: Once | ORAL | Status: DC | PRN

## 2021-07-16 MED ORDER — PROMETHAZINE HCL 25 MG/ML IJ SOLN: 6.2500 mg | INTRAMUSCULAR | Status: DC | PRN

## 2021-07-16 MED ORDER — BUTAMBEN-TETRACAINE-BENZOCAINE 2-2-14 % EX AERO
INHALATION_SPRAY | CUTANEOUS | Status: DC | PRN
  Administered 2021-07-16: 2 via TOPICAL

## 2021-07-16 MED ORDER — PERFLUTREN LIPID MICROSPHERE
INTRAVENOUS | Status: DC | PRN
  Administered 2021-07-16: 1.5 mL via INTRAVENOUS

## 2021-07-16 NOTE — Progress Notes (Signed)
  Echocardiogram Echocardiogram Transesophageal has been performed.  [name] 07/16/2021, 1:54 PM

## 2021-07-16 NOTE — Transfer of Care (Signed)
Immediate Anesthesia Transfer of Care Note  Patient: [name]  Procedure(s) Performed: TRANSESOPHAGEAL ECHOCARDIOGRAM (TEE) CARDIOVERSION  Patient Location: PACU  Anesthesia Type:MAC  Level of Consciousness: awake, alert  and oriented  Airway & Oxygen Therapy: Patient Spontanous Breathing and Patient connected to nasal cannula oxygen  Post-op Assessment: Report given to RN and Post -op Vital signs reviewed and stable  Post vital signs: Reviewed and stable  Last Vitals:  Vitals Value Taken Time  BP 102/42 07/16/21 1338  Temp    Pulse 78 07/16/21 1340  Resp 22 07/16/21 1340  SpO2 100 % 07/16/21 1340  Vitals shown include unvalidated device data.  Last Pain:  Vitals:   07/16/21 1214  TempSrc: Temporal  PainSc: 0-No pain      Patients Stated Pain Goal: 2 (16/10/96 0454)  Complications: No notable events documented.

## 2021-07-16 NOTE — Progress Notes (Signed)
[place] for warfarin + therapeutic enoxaparin  Indication: atrial fibrillation   Allergies  Allergen Reactions   Levofloxacin Itching   Morphine And Related Itching    Pt reports oxycodone history with no allergy.    Vital Signs: Temp: 98.7 F (37.1 C) (10/20 0747) Temp Source: Oral (10/20 0747) BP: 107/57 (10/20 0747) Pulse Rate: 90 (10/20 0747)  Labs: Recent Labs    07/14/21 0710 07/14/21 1322 07/14/21 1410 07/15/21 0348 07/16/21 0408  HGB 13.0  --   --  10.9* 10.9*  HCT 41.5  --   --  34.6* 33.1*  PLT 219  --   --  226 226  LABPROT  --   --  18.1* 19.9* 22.8*  INR  --   --  1.5* 1.7* 2.0*  CREATININE 1.15*  --   --  1.10*  --   TROPONINIHS 7 10  --   --   --      Estimated Creatinine Clearance: 62.8 mL/min (A) (by C-G formula based on SCr of 1.1 mg/dL (H)).   Medical History: Past Medical History:  Diagnosis Date   Arthritis    "knees" (02/05/2016)   Cancer ([place]) 5/10   [facility]    CHF (congestive heart failure) (HCC)    Coronary artery disease    GERD (gastroesophageal reflux disease)    tx meds   H/O hiatal hernia    History of blood transfusion "several"   last april 2015 s/p knee surgery (02/05/2016)   Hypercholesteremia    Hypertension    Lung cancer ([place])    2010, surgery 18% left lung no radiation or chemo   Non-STEMI (non-ST elevated myocardial infarction) ([place]) 04/07/2012   See cath results below   Paroxysmal atrial fibrillation ([place]) 04/07/2012   On Warfarin   Peripheral vascular disease ([place]) 8/12   Lt SFA PTA   Presence of stent in LAD coronary artery 04/07/2012   Xience Expedition DES 2.75 mm x 18 mm (dilated to 3.0 mm)   Sleep apnea    does not wear CPAP   Type II diabetes mellitus (HCC)    insulin dependent    Medications:  Medications Prior to Admission  Medication Sig Dispense Refill Last Dose   furosemide (LASIX) 40 MG tablet Take 1 tablet (40 mg total) by mouth daily as needed for edema  (For a weight over 295 lbs). 90 tablet 0 unknown   insulin NPH-regular Human (70-30) 100 UNIT/ML injection Inject 20-62 Units into the skin 2 (two) times daily with a meal. 62 units in the morning and 20 units at bedtme   07/13/2021   lisinopril-hydrochlorothiazide (PRINZIDE,ZESTORETIC) 20-25 MG per tablet Take 1 tablet by mouth daily.   07/13/2021   metoCLOPramide (REGLAN) 10 MG tablet Take 10 mg by mouth 2 (two) times daily. Per patient   07/13/2021   metoprolol tartrate (LOPRESSOR) 50 MG tablet 50 mg in the morning and 25 mg at bedtime 180 tablet 3 07/13/2021 at 2000   pantoprazole (PROTONIX) 40 MG tablet Take 1 tablet (40 mg total) by mouth daily. 90 tablet 3 07/13/2021   simvastatin (ZOCOR) 40 MG tablet Take 1 tablet (40 mg total) by mouth daily at 6 PM. 30 tablet 0 07/13/2021   spironolactone (ALDACTONE) 25 MG tablet Take 1 tablet (25 mg total) by mouth daily as needed (For a weight over 295 lbs). 90 tablet 0 Past Month   Vitamin D, Cholecalciferol, 1000 units TABS Take 1,000 Units by mouth every  morning.    07/13/2021   warfarin (COUMADIN) 7.5 MG tablet TAKE 1/2 TO 1 TABLET DAILY AS DIRECTED BY COUMADIN CLINIC (Patient taking differently: Take 3.75-7.5 mg by mouth as directed. TAKE 1 TABLET (7.5 MG) ON (TUES & THURS) & TAKE 1/2 TABLET (3.75 MG) ALL OTHER DAYS AS DIRECTED BY COUMADIN CLINIC) 90 tablet 0 07/13/2021 at 2000   Blood Glucose Monitoring Suppl ([place]) w/Device KIT       Continuous Blood Gluc Receiver (FREESTYLE LIBRE 2 READER) DEVI See admin instructions.      Lancets (ONETOUCH DELICA PLUS [id number]) MISC Apply topically.      ONETOUCH VERIO test strip 1 each 2 (two) times daily.      simvastatin (ZOCOR) 40 MG tablet Take 1 tablet (40 mg total) by mouth at bedtime. 90 tablet 3    Scheduled:   cholecalciferol  1,000 Units Oral BH-q7a   enoxaparin (LOVENOX) injection  120 mg Subcutaneous BID   hydrochlorothiazide  25 mg Oral Daily   influenza vaccine adjuvanted   0.5 mL Intramuscular Tomorrow-1000   insulin aspart  0-15 Units Subcutaneous TID WC   insulin aspart  0-5 Units Subcutaneous QHS   insulin aspart protamine- aspart  20 Units Subcutaneous Q supper   insulin aspart protamine- aspart  62 Units Subcutaneous Q breakfast   metoCLOPramide  10 mg Oral BID   metoprolol tartrate  50 mg Oral q morning   pantoprazole sodium  40 mg Oral Daily   simvastatin  40 mg Oral q1800   sodium chloride flush  3 mL Intravenous Q12H   Warfarin - Pharmacist Dosing Inpatient   Does not apply q1600   Infusions:   sodium chloride     sodium chloride 20 mL/hr at 07/15/21 1608    Assessment: Patient presents with tachycardia (HR up to 121) but no chest pain. On warfarin PTA for a.fib. 7.5 mg T and Th, 3.75 mg all other days. Last dose 10/18 at 2000. INR was 1.5 at admission. Plan for TEE-CV today Pharmacy dosing lovenox and warfarin.  -INR= 2.0 (trend up), hg= 10.9  Goal of Therapy: INR 2-3 Monitor platelets by anticoagulation protocol: Yes   Plan:  Give warfarin 3.75m  With DCCV today, would consider lovenox until 10/21 Daily PT/INR  AHildred Laser PharmD Clinical Pharmacist **Pharmacist phone directory can now be found on [email or web address] (PW [email or web address]).  Listed under [place]

## 2021-07-16 NOTE — Progress Notes (Signed)
Progress Note  Patient Name: [name] Date of Encounter: 07/16/2021  [facility] Cardiologist: Sanda Klein, MD   Subjective   Remains in atrial flutter, rates 80s-120s. She is NPO for TEE-CV early this afternoon. INR 2 today, being bridged with lovenox.  Inpatient Medications    Scheduled Meds:  cholecalciferol  1,000 Units Oral BH-q7a   enoxaparin (LOVENOX) injection  120 mg Subcutaneous BID   hydrochlorothiazide  25 mg Oral Daily   influenza vaccine adjuvanted  0.5 mL Intramuscular Tomorrow-1000   insulin aspart  0-15 Units Subcutaneous TID WC   insulin aspart  0-5 Units Subcutaneous QHS   insulin aspart protamine- aspart  20 Units Subcutaneous Q supper   insulin aspart protamine- aspart  62 Units Subcutaneous Q breakfast   metoCLOPramide  10 mg Oral BID   metoprolol tartrate  50 mg Oral q morning   pantoprazole sodium  40 mg Oral Daily   simvastatin  40 mg Oral q1800   sodium chloride flush  3 mL Intravenous Q12H   warfarin  3.75 mg Oral ONCE-1600   Warfarin - Pharmacist Dosing Inpatient   Does not apply q1600   Continuous Infusions:  sodium chloride     sodium chloride 20 mL/hr at 07/15/21 1608   PRN Meds: sodium chloride, acetaminophen, ALPRAZolam, nitroGLYCERIN, ondansetron (ZOFRAN) IV, sodium chloride flush, spironolactone, zolpidem   Vital Signs    Vitals:   07/15/21 1532 07/15/21 2146 07/16/21 0500 07/16/21 0747  BP: 128/63 (!) 149/81 (!) 101/52 (!) 107/57  Pulse: 82 99  90  Resp: 20 (!) 24 18 20   Temp: (!) 97.5 F (36.4 C) 98.8 F (37.1 C) 98.8 F (37.1 C) 98.7 F (37.1 C)  TempSrc: Oral Oral Oral Oral  SpO2: 100% 99% 100% 99%  Weight:   133.4 kg   Height:        Intake/Output Summary (Last 24 hours) at 07/16/2021 1149 Last data filed at 07/16/2021 1100 Gross per 24 hour  Intake 809.43 ml  Output 1250 ml  Net -440.57 ml   Last 3 Weights 07/16/2021 07/15/2021 07/15/2021  Weight (lbs) 294 lb 1.5 oz 290 lb 286 lb  Weight (kg)  133.4 kg 131.543 kg 129.729 kg      Telemetry    Rates 80s-brief 130s - Personally Reviewed  ECG    Atrial flutter, RBBB - Personally Reviewed  Physical Exam   GEN: Well nourished, well developed in no acute distress NECK: No JVD appreciated but difficult body habitus CARDIAC: largely regular rhythm, normal S1 and S2, no rubs or gallops. No murmur. VASCULAR: Radial pulses 2+ bilaterally.  RESPIRATORY:  Clear to auscultation without rales, wheezing or rhonchi  ABDOMEN: Soft, non-tender, non-distended MUSCULOSKELETAL:  Moves all 4 limbs independently SKIN: Warm and dry, no pitting edema, chronic venous stasis skin changes NEUROLOGIC:  No focal neuro deficits noted. PSYCHIATRIC:  Normal affect    Labs    High Sensitivity Troponin:   Recent Labs  Lab 07/14/21 0710 07/14/21 1322  TROPONINIHS 7 10     Chemistry Recent Labs  Lab 07/14/21 0710 07/15/21 0348  NA 136 134*  K 3.5 3.9  CL 99 99  CO2 24 24  GLUCOSE 214* 195*  BUN 16 14  CREATININE 1.15* 1.10*  CALCIUM 10.0 9.5  PROT  --  7.1  ALBUMIN  --  2.9*  AST  --  25  ALT  --  26  ALKPHOS  --  169*  BILITOT  --  1.1  GFRNONAA 51*  54*  ANIONGAP 13 11    Lipids  Recent Labs  Lab 07/15/21 0348  CHOL 115  TRIG 59  HDL 44  LDLCALC 59  CHOLHDL 2.6    Hematology Recent Labs  Lab 07/14/21 0710 07/15/21 0348 07/16/21 0408  WBC 8.3 8.6 8.5  RBC 4.58 3.90 3.80*  HGB 13.0 10.9* 10.9*  HCT 41.5 34.6* 33.1*  MCV 90.6 88.7 87.1  MCH 28.4 27.9 28.7  MCHC 31.3 31.5 32.9  RDW 13.2 13.4 13.4  PLT 219 226 226   Thyroid  Recent Labs  Lab 07/14/21 1410  TSH 2.181    BNP Recent Labs  Lab 07/14/21 1410  BNP 163.5*    DDimer  Recent Labs  Lab 07/14/21 1610  DDIMER 0.34     Radiology    ECHOCARDIOGRAM COMPLETE  Result Date: 07/15/2021    ECHOCARDIOGRAM REPORT   Patient Name:   [name] Date of Exam: 07/15/2021 Medical Rec #:  [id number]          Height:       63.0 in Accession #:     [id number]         Weight:       293.0 lb Date of Birth:  1951/06/29           BSA:          2.274 m Patient Age:    70 years           BP:           103/69 mmHg Patient Gender: F                  HR:           119 bpm. Exam Location:  Inpatient Procedure: 2D Echo, Cardiac Doppler and Color Doppler Indications:    Atrial fibrillation  History:        Patient has prior history of Echocardiogram examinations, most                 recent 08/15/2017. CAD, Prior CABG, Arrythmias:Atrial                 Fibrillation; Risk Factors:Hypertension, Diabetes and                 Dyslipidemia. PAD. HFpEF.  Sonographer:    Clayton Lefort RDCS (AE) Referring Phys: 1993 [facility] G BARRETT  Sonographer Comments: Technically difficult study due to poor echo windows and patient is morbidly obese. Image acquisition challenging due to patient body habitus. IMPRESSIONS  1. Left ventricular ejection fraction, by estimation, is 55 to 60%. The left ventricle has normal function. The left ventricle has no regional wall motion abnormalities. There is moderate asymmetric left ventricular hypertrophy of the septal segment. Left ventricular diastolic parameters are indeterminate.  2. Right ventricular systolic function is mildly reduced. The right ventricular size is mildly enlarged.  3. Right atrial size was mildly dilated.  4. The mitral valve is normal in structure. No evidence of mitral valve regurgitation. No evidence of mitral stenosis.  5. Tricuspid valve regurgitation is moderate.  6. The aortic valve is normal in structure. Aortic valve regurgitation is not visualized. Mild aortic valve sclerosis is present, with no evidence of aortic valve stenosis.  7. The inferior vena cava is normal in size with greater than 50% respiratory variability, suggesting right atrial pressure of 3 mmHg. FINDINGS  Left Ventricle: Left ventricular ejection fraction, by estimation, is 55 to 60%. The left ventricle has  normal function. The left ventricle has no  regional wall motion abnormalities. The left ventricular internal cavity size was normal in size. There is  moderate asymmetric left ventricular hypertrophy of the septal segment. Left ventricular diastolic parameters are indeterminate. Right Ventricle: The right ventricular size is mildly enlarged. No increase in right ventricular wall thickness. Right ventricular systolic function is mildly reduced. Left Atrium: Left atrial size was normal in size. Right Atrium: Right atrial size was mildly dilated. Pericardium: There is no evidence of pericardial effusion. Mitral Valve: The mitral valve is normal in structure. Mild mitral annular calcification. No evidence of mitral valve regurgitation. No evidence of mitral valve stenosis. Tricuspid Valve: The tricuspid valve is normal in structure. Tricuspid valve regurgitation is moderate . No evidence of tricuspid stenosis. Aortic Valve: The aortic valve is normal in structure. Aortic valve regurgitation is not visualized. Mild aortic valve sclerosis is present, with no evidence of aortic valve stenosis. Aortic valve mean gradient measures 3.0 mmHg. Aortic valve peak gradient measures 4.9 mmHg. Aortic valve area, by VTI measures 2.50 cm. Pulmonic Valve: The pulmonic valve was normal in structure. Pulmonic valve regurgitation is not visualized. No evidence of pulmonic stenosis. Aorta: The aortic root is normal in size and structure. Venous: The inferior vena cava is normal in size with greater than 50% respiratory variability, suggesting right atrial pressure of 3 mmHg. IAS/Shunts: No atrial level shunt detected by color flow Doppler.  LEFT VENTRICLE PLAX 2D LVIDd:         4.50 cm LVIDs:         3.20 cm LV PW:         1.10 cm LV IVS:        1.50 cm LVOT diam:     2.00 cm LV SV:         44 LV SV Index:   19 LVOT Area:     3.14 cm  RIGHT VENTRICLE            IVC RV Basal diam:  4.50 cm    IVC diam: 2.20 cm RV Mid diam:    3.10 cm RV S prime:     7.35 cm/s TAPSE (M-mode): 0.6  cm LEFT ATRIUM           Index        RIGHT ATRIUM           Index LA diam:      4.00 cm 1.76 cm/m   RA Area:     22.30 cm LA Vol (A2C): 27.4 ml 12.05 ml/m  RA Volume:   77.00 ml  33.86 ml/m LA Vol (A4C): 44.7 ml 19.65 ml/m  AORTIC VALVE AV Area (Vmax):    2.30 cm AV Area (Vmean):   2.02 cm AV Area (VTI):     2.50 cm AV Vmax:           111.00 cm/s AV Vmean:          79.100 cm/s AV VTI:            0.176 m AV Peak Grad:      4.9 mmHg AV Mean Grad:      3.0 mmHg LVOT Vmax:         81.40 cm/s LVOT Vmean:        50.900 cm/s LVOT VTI:          0.140 m LVOT/AV VTI ratio: 0.80  AORTA Ao Root diam: 2.90 cm Ao Asc diam:  2.70 cm TRICUSPID VALVE  TR Peak grad:   47.6 mmHg TR Vmax:        345.00 cm/s  SHUNTS Systemic VTI:  0.14 m Systemic Diam: 2.00 cm Godfrey Pick Tobb DO Electronically signed by Berniece Salines DO Signature Date/Time: 07/15/2021/4:46:33 PM    Final     Cardiac Studies   Echo 07/15/21 1. Left ventricular ejection fraction, by estimation, is 55 to 60%. The  left ventricle has normal function. The left ventricle has no regional  wall motion abnormalities. There is moderate asymmetric left ventricular  hypertrophy of the septal segment.  Left ventricular diastolic parameters are indeterminate.   2. Right ventricular systolic function is mildly reduced. The right  ventricular size is mildly enlarged.   3. Right atrial size was mildly dilated.   4. The mitral valve is normal in structure. No evidence of mitral valve  regurgitation. No evidence of mitral stenosis.   5. Tricuspid valve regurgitation is moderate.   6. The aortic valve is normal in structure. Aortic valve regurgitation is  not visualized. Mild aortic valve sclerosis is present, with no evidence  of aortic valve stenosis.   7. The inferior vena cava is normal in size with greater than 50%  respiratory variability, suggesting right atrial pressure of 3 mmHg.   Patient Profile     70 y.o. female with history of CAD s/p CABG, PAD,  HFpEF, PAF who presented with atrial flutter  Assessment & Plan    Atrial flutter -chadsvasc=6 -subtherapeutic INR on presentation, INR 2.0  today -we discussed options. I personally reviewed her echo; to me, EF appears mildly reduced -we discussed rate control, close INR monitoring, and outpatient cardioversion vs. Inpatient TEE-CV with bridging with lovenox. After shared decision making, we will proceed with TEE-CV, scheduled for today -she has had esophageal dilation in the past, not in years, and no issues with food getting stuck. Had TEE for CABG in 2018 without issue -appreciate pharmacy assistance, dosing with lovenox to bridge INR -continue metoprolol  Type II diabetes on insulin Hyperlipidemia -A1c 8.9 this admission -Continue current insulin regimen, CBGS 100-284. Currently NPO -on reglan -cholesterol controlled on simvastatin -no aspirin as she is on coumadin  Hypertension -on lisinopril, metoprolol, HCTZ, spironolactone at home  For questions or updates, please contact [place] Please consult [email or web address] for contact info under     Signed, Buford Dresser, MD  07/16/2021, 11:49 AM

## 2021-07-16 NOTE — Anesthesia Postprocedure Evaluation (Signed)
Anesthesia Post Note  Patient: [name]  Procedure(s) Performed: TRANSESOPHAGEAL ECHOCARDIOGRAM (TEE) CARDIOVERSION     Patient location during evaluation: PACU Anesthesia Type: MAC Level of consciousness: awake and alert Pain management: pain level controlled Vital Signs Assessment: post-procedure vital signs reviewed and stable Respiratory status: spontaneous breathing, nonlabored ventilation and respiratory function stable Cardiovascular status: blood pressure returned to baseline and stable Postop Assessment: no apparent nausea or vomiting Anesthetic complications: no   No notable events documented.  Last Vitals:  Vitals:   07/16/21 1350 07/16/21 1405  BP: (!) 102/47 (!) 102/48  Pulse: 80 87  Resp: 19 (!) 22  Temp:  37.1 C  SpO2: 98% 98%    Last Pain:  Vitals:   07/16/21 1350  TempSrc:   PainSc: 0-No pain                 Lynda Rainwater

## 2021-07-16 NOTE — Anesthesia Preprocedure Evaluation (Signed)
Anesthesia Evaluation  Patient identified by MRN, date of birth, ID band Patient awake    Reviewed: Allergy & Precautions, NPO status , Patient's Chart, lab work & pertinent test results  Airway Mallampati: II  TM Distance: >3 FB Neck ROM: Full    Dental  (+) Dental Advisory Given   Pulmonary sleep apnea , COPD, former smoker,    breath sounds clear to auscultation       Cardiovascular hypertension, Pt. on medications and Pt. on home beta blockers + angina + CAD, + Past MI, + Cardiac Stents and + Peripheral Vascular Disease  + dysrhythmias Atrial Fibrillation  Rhythm:Regular Rate:Normal     Neuro/Psych negative neurological ROS     GI/Hepatic Neg liver ROS, GERD  ,  Endo/Other  diabetes, Type 2, Insulin DependentMorbid obesity  Renal/GU Renal disease     Musculoskeletal  (+) Arthritis ,   Abdominal   Peds  Hematology  (+) anemia ,   Anesthesia Other Findings   Reproductive/Obstetrics                             Lab Results  Component Value Date   WBC 8.5 07/16/2021   HGB 10.9 (L) 07/16/2021   HCT 33.1 (L) 07/16/2021   MCV 87.1 07/16/2021   PLT 226 07/16/2021   Lab Results  Component Value Date   CREATININE 1.10 (H) 07/15/2021   BUN 14 07/15/2021   NA 134 (L) 07/15/2021   K 3.9 07/15/2021   CL 99 07/15/2021   CO2 24 07/15/2021    Anesthesia Physical  Anesthesia Plan  ASA: IV  Anesthesia Plan: MAC   Post-op Pain Management:    Induction: Intravenous  PONV Risk Score and Plan: 2 and Ondansetron, Treatment may vary due to age or medical condition and Midazolam  Airway Management Planned: Nasal Cannula  Additional Equipment:   Intra-op Plan:   Post-operative Plan:   Informed Consent: I have reviewed the patients History and Physical, chart, labs and discussed the procedure including the risks, benefits and alternatives for the proposed anesthesia with the patient  or authorized representative who has indicated his/her understanding and acceptance.     Dental advisory given  Plan Discussed with: CRNA  Anesthesia Plan Comments:         Anesthesia Quick Evaluation

## 2021-07-16 NOTE — CV Procedure (Signed)
   TRANSESOPHAGEAL ECHOCARDIOGRAM GUIDED DIRECT CURRENT CARDIOVERSION  NAME:  [name]    MRN: [id number] DOB:  Jan 28, 1951    ADMIT DATE: 07/14/2021  INDICATIONS: Symptomatic atrial flutter  PROCEDURE:   Informed consent was obtained prior to the procedure. The risks, benefits and alternatives for the procedure were discussed and the patient comprehended these risks.  Risks include, but are not limited to, cough, sore throat, vomiting, nausea, somnolence, esophageal and stomach trauma or perforation, bleeding, low blood pressure, aspiration, pneumonia, infection, trauma to the teeth and death.    After a procedural time-out, the oropharynx was anesthetized and the patient was sedated by the anesthesia service. The transesophageal probe was inserted in the esophagus and stomach without difficulty and multiple views were obtained. Anesthesia was monitored by Dr. Ermalene Postin and team.   COMPLICATIONS:    Complications: No complications Patient tolerated procedure well.  KEY FINDINGS:  Full Report to follow.   CARDIOVERSION:     Indications:  Symptomatic Atrial Flutter  Procedure Details:  Once the TEE was complete, the patient had the defibrillator pads placed in the anterior and posterior position. Once an appropriate level of sedation was confirmed, the patient was cardioverted x 1 with 200J of biphasic synchronized energy.  The patient converted to NSR.  There were no apparent complications.  The patient had normal neuro status and respiratory status post procedure with vitals stable as recorded elsewhere.  Adequate airway was maintained throughout and vital signs monitored per protocol.  Rudean Haskell, MD [place]  1:34 PM

## 2021-07-17 ENCOUNTER — Other Ambulatory Visit (HOSPITAL_COMMUNITY): Payer: Self-pay

## 2021-07-17 ENCOUNTER — Encounter (HOSPITAL_COMMUNITY): Payer: Self-pay | Admitting: Internal Medicine

## 2021-07-17 DIAGNOSIS — Z20822 Contact with and (suspected) exposure to covid-19: Secondary | ICD-10-CM | POA: Diagnosis not present

## 2021-07-17 DIAGNOSIS — I484 Atypical atrial flutter: Secondary | ICD-10-CM | POA: Diagnosis not present

## 2021-07-17 DIAGNOSIS — I480 Paroxysmal atrial fibrillation: Secondary | ICD-10-CM | POA: Diagnosis not present

## 2021-07-17 DIAGNOSIS — N183 Chronic kidney disease, stage 3 unspecified: Secondary | ICD-10-CM | POA: Diagnosis not present

## 2021-07-17 DIAGNOSIS — I4892 Unspecified atrial flutter: Secondary | ICD-10-CM | POA: Diagnosis not present

## 2021-07-17 DIAGNOSIS — I5032 Chronic diastolic (congestive) heart failure: Secondary | ICD-10-CM | POA: Diagnosis not present

## 2021-07-17 DIAGNOSIS — I130 Hypertensive heart and chronic kidney disease with heart failure and stage 1 through stage 4 chronic kidney disease, or unspecified chronic kidney disease: Secondary | ICD-10-CM | POA: Diagnosis not present

## 2021-07-17 DIAGNOSIS — I2510 Atherosclerotic heart disease of native coronary artery without angina pectoris: Secondary | ICD-10-CM | POA: Diagnosis not present

## 2021-07-17 DIAGNOSIS — Z794 Long term (current) use of insulin: Secondary | ICD-10-CM | POA: Diagnosis not present

## 2021-07-17 DIAGNOSIS — E119 Type 2 diabetes mellitus without complications: Secondary | ICD-10-CM | POA: Diagnosis not present

## 2021-07-17 DIAGNOSIS — Z23 Encounter for immunization: Secondary | ICD-10-CM | POA: Diagnosis not present

## 2021-07-17 DIAGNOSIS — Z96652 Presence of left artificial knee joint: Secondary | ICD-10-CM | POA: Diagnosis not present

## 2021-07-17 DIAGNOSIS — I429 Cardiomyopathy, unspecified: Secondary | ICD-10-CM | POA: Diagnosis not present

## 2021-07-17 DIAGNOSIS — Z951 Presence of aortocoronary bypass graft: Secondary | ICD-10-CM | POA: Diagnosis not present

## 2021-07-17 LAB — CBC
HCT: 34.3 % — ABNORMAL LOW (ref 36.0–46.0)
Hemoglobin: 10.9 g/dL — ABNORMAL LOW (ref 12.0–15.0)
MCH: 28.1 pg (ref 26.0–34.0)
MCHC: 31.8 g/dL (ref 30.0–36.0)
MCV: 88.4 fL (ref 80.0–100.0)
Platelets: 234 10*3/uL (ref 150–400)
RBC: 3.88 MIL/uL (ref 3.87–5.11)
RDW: 13.5 % (ref 11.5–15.5)
WBC: 8.9 10*3/uL (ref 4.0–10.5)
nRBC: 0 % (ref 0.0–0.2)

## 2021-07-17 LAB — PROTIME-INR
INR: 2.3 — ABNORMAL HIGH (ref 0.8–1.2)
Prothrombin Time: 25 seconds — ABNORMAL HIGH (ref 11.4–15.2)

## 2021-07-17 LAB — GLUCOSE, CAPILLARY: Glucose-Capillary: 158 mg/dL — ABNORMAL HIGH (ref 70–99)

## 2021-07-17 MED ORDER — METOPROLOL TARTRATE 25 MG PO TABS
ORAL_TABLET | ORAL | 6 refills | Status: DC
Start: 2021-07-17 — End: 2022-11-09

## 2021-07-17 MED ORDER — METOPROLOL SUCCINATE ER 50 MG PO TB24
50.0000 mg | ORAL_TABLET | Freq: Every day | ORAL | 3 refills | Status: DC
  Filled 2021-07-17: qty 90, 90d supply, fill #0

## 2021-07-17 MED ORDER — WARFARIN SODIUM 2.5 MG PO TABS
3.7500 mg | ORAL_TABLET | Freq: Once | ORAL | Status: DC
  Filled 2021-07-17: qty 1

## 2021-07-17 NOTE — Progress Notes (Signed)
[place] for warfarin + therapeutic enoxaparin  Indication: atrial fibrillation   Allergies  Allergen Reactions   Levofloxacin Itching   Morphine And Related Itching    Pt reports oxycodone history with no allergy.    Vital Signs: Temp: 99.3 F (37.4 C) (10/21 0519) Temp Source: Oral (10/21 0519) BP: 119/49 (10/21 0519) Pulse Rate: 88 (10/21 0519)  Labs: Recent Labs    07/14/21 1322 07/14/21 1410 07/15/21 0348 07/16/21 0408 07/17/21 0133  HGB  --    < > 10.9* 10.9* 10.9*  HCT  --   --  34.6* 33.1* 34.3*  PLT  --   --  226 226 234  LABPROT  --    < > 19.9* 22.8* 25.0*  INR  --    < > 1.7* 2.0* 2.3*  CREATININE  --   --  1.10*  --   --   TROPONINIHS 10  --   --   --   --    < > = values in this interval not displayed.     Estimated Creatinine Clearance: 63 mL/min (A) (by C-G formula based on SCr of 1.1 mg/dL (H)).   Medical History: Past Medical History:  Diagnosis Date   Arthritis    "knees" (02/05/2016)   Cancer ([place]) 5/10   [facility] [place]    CHF (congestive heart failure) (HCC)    Coronary artery disease    GERD (gastroesophageal reflux disease)    tx meds   H/O hiatal hernia    History of blood transfusion "several"   last april 2015 s/p knee surgery (02/05/2016)   Hypercholesteremia    Hypertension    Lung cancer ([place])    2010, surgery 18% left lung no radiation or chemo   Non-STEMI (non-ST elevated myocardial infarction) ([place]) 04/07/2012   See cath results below   Paroxysmal atrial fibrillation ([place]) 04/07/2012   On Warfarin   Peripheral vascular disease ([place]) 8/12   Lt SFA PTA   Presence of stent in LAD coronary artery 04/07/2012   [employer] DES 2.75 mm x 18 mm (dilated to 3.0 mm)   Sleep apnea    does not wear CPAP   Type II diabetes mellitus (HCC)    insulin dependent    Medications:  Medications Prior to Admission  Medication Sig Dispense Refill Last Dose   furosemide (LASIX) 40 MG tablet Take 1  tablet (40 mg total) by mouth daily as needed for edema (For a weight over 295 lbs). 90 tablet 0 unknown   insulin NPH-regular Human (70-30) 100 UNIT/ML injection Inject 20-62 Units into the skin 2 (two) times daily with a meal. 62 units in the morning and 20 units at bedtme   07/13/2021   lisinopril-hydrochlorothiazide (PRINZIDE,ZESTORETIC) 20-25 MG per tablet Take 1 tablet by mouth daily.   07/13/2021   metoCLOPramide (REGLAN) 10 MG tablet Take 10 mg by mouth 2 (two) times daily. Per patient   07/13/2021   metoprolol tartrate (LOPRESSOR) 50 MG tablet 50 mg in the morning and 25 mg at bedtime 180 tablet 3 07/13/2021 at 2000   pantoprazole (PROTONIX) 40 MG tablet Take 1 tablet (40 mg total) by mouth daily. 90 tablet 3 07/13/2021   simvastatin (ZOCOR) 40 MG tablet Take 1 tablet (40 mg total) by mouth daily at 6 PM. 30 tablet 0 07/13/2021   spironolactone (ALDACTONE) 25 MG tablet Take 1 tablet (25 mg total) by mouth daily as needed (For a weight over 295 lbs). [place]  tablet 0 Past Month   Vitamin D, Cholecalciferol, 1000 units TABS Take 1,000 Units by mouth every morning.    07/13/2021   warfarin (COUMADIN) 7.5 MG tablet TAKE 1/2 TO 1 TABLET DAILY AS DIRECTED BY COUMADIN CLINIC (Patient taking differently: Take 3.75-7.5 mg by mouth as directed. TAKE 1 TABLET (7.5 MG) ON (TUES & THURS) & TAKE 1/2 TABLET (3.75 MG) ALL OTHER DAYS AS DIRECTED BY COUMADIN CLINIC) 90 tablet 0 07/13/2021 at 2000   Blood Glucose Monitoring Suppl ([place]) w/Device KIT       Continuous Blood Gluc Receiver (FREESTYLE LIBRE 2 READER) DEVI See admin instructions.      Lancets (ONETOUCH DELICA PLUS [id number]) MISC Apply topically.      ONETOUCH VERIO test strip 1 each 2 (two) times daily.      simvastatin (ZOCOR) 40 MG tablet Take 1 tablet (40 mg total) by mouth at bedtime. 90 tablet 3    Scheduled:   cholecalciferol  1,000 Units Oral BH-q7a   enoxaparin (LOVENOX) injection  120 mg Subcutaneous BID    hydrochlorothiazide  25 mg Oral Daily   influenza vaccine adjuvanted  0.5 mL Intramuscular Tomorrow-1000   insulin aspart  0-15 Units Subcutaneous TID WC   insulin aspart  0-5 Units Subcutaneous QHS   insulin aspart protamine- aspart  20 Units Subcutaneous Q supper   insulin aspart protamine- aspart  62 Units Subcutaneous Q breakfast   metoCLOPramide  10 mg Oral BID   metoprolol tartrate  50 mg Oral q morning   pantoprazole sodium  40 mg Oral Daily   simvastatin  40 mg Oral q1800   sodium chloride flush  3 mL Intravenous Q12H   Warfarin - Pharmacist Dosing Inpatient   Does not apply q1600   Infusions:   sodium chloride      Assessment: Patient presents with tachycardia (HR up to 121) but no chest pain. On warfarin PTA for a.fib. 7.5 mg T and Th, 3.75 mg all other days. Last dose 10/18 at 2000. INR was 1.5 at admission. She is now s/p TEE-CV 10/20 Pharmacy dosing lovenox and warfarin.  -INR= 2.3 (trend up), hg= 10.9  Home warfarin dose: 7.5 mg TuTh; 3.75 mg all other days  Goal of Therapy: INR 2-3 Monitor platelets by anticoagulation protocol: Yes   Plan:  -Give warfarin 3.10m  -Would discontinue lovenox -INR was 2.1 at the last Clinic visit on 10/14. Would discharge on her home regimen  AHildred Laser PharmD Clinical Pharmacist **Pharmacist phone directory can now be found on [email or web address] ([email or web address]).  Listed under [place]

## 2021-07-17 NOTE — Discharge Summary (Signed)
Discharge Summary    Patient ID: [name] MRN: [id number]; DOB: 1951/04/06  Admit date: 07/14/2021 Discharge date: 07/17/2021  PCP:  [name], MD   [facility] Providers Cardiologist:  Sanda Klein, MD   {   Discharge Diagnoses    Active Problems:   Atrial flutter [facility])   Chest pain with moderate risk for cardiac etiology  CAD s/p CABG   PAD   Chronic diastolic CHF   DM    Diagnostic Studies/Procedures    Echo 07/15/2021 1. Left ventricular ejection fraction, by estimation, is 55 to 60%. The  left ventricle has normal function. The left ventricle has no regional  wall motion abnormalities. There is moderate asymmetric left ventricular  hypertrophy of the septal segment.  Left ventricular diastolic parameters are indeterminate.   2. Right ventricular systolic function is mildly reduced. The right  ventricular size is mildly enlarged.   3. Right atrial size was mildly dilated.   4. The mitral valve is normal in structure. No evidence of mitral valve  regurgitation. No evidence of mitral stenosis.   5. Tricuspid valve regurgitation is moderate.   6. The aortic valve is normal in structure. Aortic valve regurgitation is  not visualized. Mild aortic valve sclerosis is present, with no evidence  of aortic valve stenosis.   7. The inferior vena cava is normal in size with greater than 50%  respiratory variability, suggesting right atrial pressure of 3 mmHg.   TEE with cardioversion  1. Left ventricular ejection fraction, by estimation, is 55 to 60%. The  left ventricle has normal function.   2. Right ventricular systolic function is normal. The right ventricular  size is normal.   3. Left atrial size was moderately dilated. No left atrial/left atrial  appendage thrombus was detected.   4. The mitral valve is normal in structure. Trivial mitral valve  regurgitation.   5. Tricuspid valve regurgitation is moderate.   6. The aortic valve is  tricuspid. Aortic valve regurgitation is not  visualized.   7. Limited study performed: history of esophageal stricture s/p prior  dilation. Slight resistance when attempting gastric imaging so this was  deferred.   Once the TEE was complete, the patient had the defibrillator pads placed in the anterior and posterior position. Once an appropriate level of sedation was confirmed, the patient was cardioverted x 1 with 200J of biphasic synchronized energy.  The patient converted to NSR.  There were no apparent complications.  The patient had normal neuro status and respiratory status post procedure with vitals stable as recorded elsewhere.  Adequate airway was maintained throughout and vital signs monitored per protocol.   History of Present Illness     [name] is a 70 y.o. female with PMH of CAD, PAD, chronic diastolic CHF, paroxysmal atrial fibrillation, type 2 diabetes on insulin, and morbid obesity presented for palpitations.   She underwent CABG November 2018 for recurrent in-stent restenosis with multiple PCI.  (PCI with DES to LAD 2013, 2015 DES to distal LAD, 4/17 received DES in proximal LAD, 11/18 again with 3 stenosis and was referred for single-vessel CABG).  She is also status post left superficial femoral artery atherectomy.    She woke up early morning of 10/18 to go check on the furnace which was not working and noticed that she felt sick with palpitations.  Denied chest pain.  Used her pulse oximeter to check her heart rate and noted the pulse was 240 beats a minute.  We  discussed inaccuracies of pulse oximeter in irregular rhythm, and she agrees that a few minutes later she took her heart rate again and noticed it was in the 120s.  It stayed sustained in the 120s and she continued to have palpitations therefore she presented to the ER..     She also feels she has not received her evening medications.  This may be contributing to less than optimal rate control.   Telemetry  shows coarse atrial fibrillation versus atypical atrial flutter.  EKG shows the same with right bundle branch block.   Sodium 136, potassium 3.5, creatinine 1.15, glucose 214, troponin 7, 10, BNP 163, D-dimer negative, INR 1.5, TSH 2.1, hemoglobin 13, platelets 219,000, white blood cell count 8300.   Hospital Course     Consultants: None   Atrial flutter  -She appears to be in atrial flutter with variable AV conduction and relatively good rate control with a heart rate around 100s. INR was 1.5 on admit. She was bridged with Lovenox. Normal EF by echo. HR remained stable on home does of metoprolol at 57m daily. She underwent successful TEE guided cardioversion. Maintain sinus rhythm overnight. INR improved to 2.3. Hgb 10.9 (stable). Discontinued Lovenox and plan to resume home dose of warfarin. Will change  metoprolol tartrate 572mdaily to Toprol XL 5065md at discharge. HR stable at 80s. Advised to use metoprolol tartrate 28m59mN for elevated heart rate.   2. Chronic diastolic CHF -BNP mildly elevated, this may reflect why she went into atrial flutter if she has mild decompensation of her heart failure. She was given po lasix 80mg34m( home dose of lasix  40mg 30mRN). Echo with normal LVEF at 55-60% and intermediate diastolic parameter. Moderate TR. Continued home HCTZ.   3. DM - Treated with SSI while admitted.  - Resume home meds - HgbA1c 8.9  Did the patient have an acute coronary syndrome (MI, NSTEMI, STEMI, etc) this admission?:  No                               Did the patient have a percutaneous coronary intervention (stent / angioplasty)?:  No.    Discharge Vitals Blood pressure (!) 119/49, pulse 88, temperature 99.3 F (37.4 C), temperature source Oral, resp. rate (!) 23, height _0  (1.6 m), weight 134.2 kg, SpO2 98 %.  Filed Weights   07/15/21 1418 07/16/21 0500 07/17/21 0519  Weight: 131.5 kg 133.4 kg 134.2 kg   Physical Exam Constitutional:      Appearance: Normal  appearance.  HENT:     Nose: Nose normal.  Eyes:     Extraocular Movements: Extraocular movements intact.     Pupils: Pupils are equal, round, and reactive to light.  Cardiovascular:     Rate and Rhythm: Normal rate and regular rhythm.  Pulmonary:     Effort: Pulmonary effort is normal.     Breath sounds: Normal breath sounds.  Abdominal:     General: Abdomen is flat.     Palpations: Abdomen is soft.  Musculoskeletal:        General: Normal range of motion.     Cervical back: Normal range of motion and neck supple.  Skin:    General: Skin is warm and dry.  Neurological:     General: No focal deficit present.     Mental Status: She is alert and oriented to person, place, and time.  Psychiatric:  Mood and Affect: Mood normal.        Behavior: Behavior normal.    Labs & Radiologic Studies    CBC Recent Labs    07/16/21 0408 07/17/21 0133  WBC 8.5 8.9  HGB 10.9* 10.9*  HCT 33.1* 34.3*  MCV 87.1 88.4  PLT 226 732   Basic Metabolic Panel Recent Labs    07/15/21 0348  NA 134*  K 3.9  CL 99  CO2 24  GLUCOSE 195*  BUN 14  CREATININE 1.10*  CALCIUM 9.5   Liver Function Tests Recent Labs    07/15/21 0348  AST 25  ALT 26  ALKPHOS 169*  BILITOT 1.1  PROT 7.1  ALBUMIN 2.9*    High Sensitivity Troponin:   Recent Labs  Lab 07/14/21 0710 07/14/21 1322  TROPONINIHS 7 10   D-Dimer Recent Labs    07/14/21 1610  DDIMER 0.34   Hemoglobin A1C Recent Labs    07/16/21 0408  HGBA1C 8.9*   Fasting Lipid Panel Recent Labs    07/15/21 0348  CHOL 115  HDL 44  LDLCALC 59  TRIG 59  CHOLHDL 2.6   Thyroid Function Tests Recent Labs    07/14/21 1410  TSH 2.181   _____________  DG Chest 2 View  Result Date: 07/14/2021 CLINICAL DATA:  70 year old female with tachycardia. EXAM: CHEST - 2 VIEW COMPARISON:  Recent chest CT 07/07/2021 and earlier. FINDINGS: Semi upright AP and lateral views of the chest. Prior sternotomy, CABG. Stable cardiomegaly  and mediastinal contours. Stable chronically somewhat low lung volumes and increased pulmonary interstitial markings. Previous left upper lobectomy. No pneumothorax, pleural effusion, consolidation, or acute pulmonary opacity identified. No acute osseous abnormality identified. Negative visible bowel gas pattern. IMPRESSION: No acute cardiopulmonary abnormality. Chronic cardiomegaly, prior left upper lobectomy. Electronically Signed   By: Genevie Ann M.D.   On: 07/14/2021 07:40   ECHOCARDIOGRAM COMPLETE  Result Date: 07/15/2021    ECHOCARDIOGRAM REPORT   Patient Name:   [name] Date of Exam: 07/15/2021 Medical Rec #:  [id number]          Height:       63.0 in Accession #:    [id number]         Weight:       293.0 lb Date of Birth:  [birth year]           BSA:          2.274 m Patient Age:    15 years           BP:           103/69 mmHg Patient Gender: F                  HR:           119 bpm. Exam Location:  Inpatient Procedure: 2D Echo, Cardiac Doppler and Color Doppler Indications:    Atrial fibrillation  History:        Patient has prior history of Echocardiogram examinations, most                 recent 08/15/2017. CAD, Prior CABG, Arrythmias:Atrial                 Fibrillation; Risk Factors:Hypertension, Diabetes and                 Dyslipidemia. PAD. HFpEF.  Sonographer:    Clayton Lefort RDCS (AE) Referring Phys: [id number] [facility] G BARRETT  Sonographer Comments: Technically difficult  study due to poor echo windows and patient is morbidly obese. Image acquisition challenging due to patient body habitus. IMPRESSIONS  1. Left ventricular ejection fraction, by estimation, is 55 to 60%. The left ventricle has normal function. The left ventricle has no regional wall motion abnormalities. There is moderate asymmetric left ventricular hypertrophy of the septal segment. Left ventricular diastolic parameters are indeterminate.  2. Right ventricular systolic function is mildly reduced. The right ventricular size is  mildly enlarged.  3. Right atrial size was mildly dilated.  4. The mitral valve is normal in structure. No evidence of mitral valve regurgitation. No evidence of mitral stenosis.  5. Tricuspid valve regurgitation is moderate.  6. The aortic valve is normal in structure. Aortic valve regurgitation is not visualized. Mild aortic valve sclerosis is present, with no evidence of aortic valve stenosis.  7. The inferior vena cava is normal in size with greater than 50% respiratory variability, suggesting right atrial pressure of 3 mmHg. FINDINGS  Left Ventricle: Left ventricular ejection fraction, by estimation, is 55 to 60%. The left ventricle has normal function. The left ventricle has no regional wall motion abnormalities. The left ventricular internal cavity size was normal in size. There is  moderate asymmetric left ventricular hypertrophy of the septal segment. Left ventricular diastolic parameters are indeterminate. Right Ventricle: The right ventricular size is mildly enlarged. No increase in right ventricular wall thickness. Right ventricular systolic function is mildly reduced. Left Atrium: Left atrial size was normal in size. Right Atrium: Right atrial size was mildly dilated. Pericardium: There is no evidence of pericardial effusion. Mitral Valve: The mitral valve is normal in structure. Mild mitral annular calcification. No evidence of mitral valve regurgitation. No evidence of mitral valve stenosis. Tricuspid Valve: The tricuspid valve is normal in structure. Tricuspid valve regurgitation is moderate . No evidence of tricuspid stenosis. Aortic Valve: The aortic valve is normal in structure. Aortic valve regurgitation is not visualized. Mild aortic valve sclerosis is present, with no evidence of aortic valve stenosis. Aortic valve mean gradient measures 3.0 mmHg. Aortic valve peak gradient measures 4.9 mmHg. Aortic valve area, by VTI measures 2.50 cm. Pulmonic Valve: The pulmonic valve was normal in  structure. Pulmonic valve regurgitation is not visualized. No evidence of pulmonic stenosis. Aorta: The aortic root is normal in size and structure. Venous: The inferior vena cava is normal in size with greater than 50% respiratory variability, suggesting right atrial pressure of 3 mmHg. IAS/Shunts: No atrial level shunt detected by color flow Doppler.  LEFT VENTRICLE PLAX 2D LVIDd:         4.50 cm LVIDs:         3.20 cm LV PW:         1.10 cm LV IVS:        1.50 cm LVOT diam:     2.00 cm LV SV:         44 LV SV Index:   19 LVOT Area:     3.14 cm  RIGHT VENTRICLE            IVC RV Basal diam:  4.50 cm    IVC diam: 2.20 cm RV Mid diam:    3.10 cm RV S prime:     7.35 cm/s TAPSE (M-mode): 0.6 cm LEFT ATRIUM           Index        RIGHT ATRIUM           Index LA diam:  4.00 cm 1.76 cm/m   RA Area:     22.30 cm LA Vol (A2C): 27.4 ml 12.05 ml/m  RA Volume:   77.00 ml  33.86 ml/m LA Vol (A4C): 44.7 ml 19.65 ml/m  AORTIC VALVE AV Area (Vmax):    2.30 cm AV Area (Vmean):   2.02 cm AV Area (VTI):     2.50 cm AV Vmax:           111.00 cm/s AV Vmean:          79.100 cm/s AV VTI:            0.176 m AV Peak Grad:      4.9 mmHg AV Mean Grad:      3.0 mmHg LVOT Vmax:         81.40 cm/s LVOT Vmean:        50.900 cm/s LVOT VTI:          0.140 m LVOT/AV VTI ratio: 0.80  AORTA Ao Root diam: 2.90 cm Ao Asc diam:  2.70 cm TRICUSPID VALVE TR Peak grad:   47.6 mmHg TR Vmax:        345.00 cm/s  SHUNTS Systemic VTI:  0.14 m Systemic Diam: 2.00 cm Kardie Tobb DO Electronically signed by Berniece Salines DO Signature Date/Time: 07/15/2021/4:46:33 PM    Final    ECHO TEE  Result Date: 07/16/2021    TRANSESOPHOGEAL ECHO REPORT   Patient Name:   [name] Date of Exam: 07/16/2021 Medical Rec #:  [id number]          Height:       63.0 in Accession #:    [id number]         Weight:       294.1 lb Date of Birth:  [birth year]           BSA:          2.278 m Patient Age:    19 years           BP:           102/42 mmHg Patient  Gender: F                  HR:           118 bpm. Exam Location:  Inpatient Procedure: Transesophageal Echo, Cardiac Doppler and Color Doppler Indications:     Atrial flutter  History:         Patient has prior history of Echocardiogram examinations, most                  recent 07/15/2021. CAD, Prior CABG, Arrythmias:Atrial                  Fibrillation and Atrial Flutter; Risk Factors:Hypertension,                  Diabetes and Dyslipidemia. PAD. HFpEF.  Sonographer:     Clayton Lefort RDCS (AE) Referring Phys:  [id number] BRIDGETTE CHRISTOPHER Diagnosing Phys: Rudean Haskell MD PROCEDURE: After discussion of the risks and benefits of a TEE, an informed consent was obtained from the patient. The transesophogeal probe was passed without difficulty through the esophogus of the patient. Local oropharyngeal anesthetic was provided with Cetacaine. Sedation performed by different physician. The patient was monitored while under deep sedation. Anesthestetic sedation was provided intravenously by Anesthesiology: 296.17m of Propofol. The patient developed no complications during the procedure. A successful direct current cardioversion was performed at 200 joules with  1 attempt. IMPRESSIONS  1. Left ventricular ejection fraction, by estimation, is 55 to 60%. The left ventricle has normal function.  2. Right ventricular systolic function is normal. The right ventricular size is normal.  3. Left atrial size was moderately dilated. No left atrial/left atrial appendage thrombus was detected.  4. The mitral valve is normal in structure. Trivial mitral valve regurgitation.  5. Tricuspid valve regurgitation is moderate.  6. The aortic valve is tricuspid. Aortic valve regurgitation is not visualized.  7. Limited study performed: history of esophageal stricture s/p prior dilation. Slight resistance when attempting gastric imaging so this was deferred. FINDINGS  Left Ventricle: Left ventricular ejection fraction, by estimation, is 55  to 60%. The left ventricle has normal function. The left ventricular internal cavity size was normal in size. Right Ventricle: The right ventricular size is normal. Right vetricular wall thickness was not assessed. Right ventricular systolic function is normal. Left Atrium: Left atrial size was moderately dilated. No left atrial/left atrial appendage thrombus was detected. Right Atrium: Right atrial size was normal in size. Pericardium: There is no evidence of pericardial effusion. Mitral Valve: The mitral valve is normal in structure. Trivial mitral valve regurgitation. Tricuspid Valve: The tricuspid valve is normal in structure. Tricuspid valve regurgitation is moderate. Aortic Valve: The aortic valve is tricuspid. Aortic valve regurgitation is not visualized. Pulmonic Valve: The pulmonic valve was grossly normal. Pulmonic valve regurgitation is trivial. Aorta: The aortic root, ascending aorta, aortic arch and descending aorta are all structurally normal, with no evidence of dilitation or obstruction. IAS/Shunts: There is left bowing of the interatrial septum, suggestive of elevated right atrial pressure. The atrial septum is grossly normal.  TRICUSPID VALVE TR Peak grad:   53.3 mmHg TR Vmax:        365.00 cm/s Rudean Haskell MD Electronically signed by Rudean Haskell MD Signature Date/Time: 07/16/2021/9:10:50 PM    Final    CT Super D Chest Wo Contrast  Result Date: 07/08/2021 CLINICAL DATA:  Lung nodules. EXAM: CT CHEST WITHOUT CONTRAST TECHNIQUE: Multidetector CT imaging of the chest was performed using thin slice collimation for electromagnetic bronchoscopy planning purposes, without intravenous contrast. COMPARISON:  12/29/2020 and 08/03/2018. FINDINGS: Cardiovascular: Atherosclerotic calcification of the aorta and aortic valve. Pulmonic trunk and heart are enlarged. No pericardial effusion. Mediastinum/Nodes: Mediastinal lymph nodes measure up to 11 mm in the low left paratracheal station,  unchanged. Hilar regions are difficult to definitively evaluate without IV contrast. No axillary adenopathy. Esophagus is grossly unremarkable. Lungs/Pleura: Image quality is degraded by expiratory phase imaging and respiratory motion, creating increased density in the lungs bilaterally. 3 mm peripheral right lower lobe nodule (8/81), unchanged and benign. Left upper lobectomy. Additional postoperative scarring in the left lower lobe. No pleural fluid. Airway is otherwise unremarkable. Upper Abdomen: Liver margin may be slightly irregular. Cholecystectomy. Visualized portions of the adrenal glands, kidneys, spleen, pancreas, stomach and bowel are unremarkable with the exception of a tiny hiatal hernia. Musculoskeletal: Degenerative changes in the spine. Left thoracotomy changes. No worrisome lytic or sclerotic lesions. IMPRESSION: 1. Left upper lobectomy. No evidence of recurrent or metastatic disease. 2. Liver margin appears slightly irregular, raising suspicion for cirrhosis. 3.  Aortic atherosclerosis (ICD10-I70.0). 4. Enlarged pulmonic trunk, indicative of pulmonary arterial hypertension. Electronically Signed   By: Lorin Picket M.D.   On: 07/08/2021 13:39   Disposition   Pt is being discharged home today in good condition.  Follow-up Plans & Appointments     Follow-up Information  [facility] Follow up on 07/23/2021.   Specialty: Cardiology Why: _0 :30pm for coumadin managment / INR check Contact information: [place] [phone]        Deberah Pelton, NP Follow up on 08/04/2021.   Specialty: Cardiology Why: _1  for hospital follow up with Dr. Victorino December PA Contact information: [address] [place] [address] [phone]                Discharge Instructions     Diet - low sodium heart healthy   Complete by: As directed    Increase activity slowly   Complete by: As directed         Discharge Medications   Allergies as of 07/17/2021       Reactions   Levofloxacin Itching   Morphine And Related Itching   Pt reports oxycodone history with no allergy.        Medication List     TAKE these medications    FreeStyle Libre 2 Reader Kerrin Mo See admin instructions.   furosemide 40 MG tablet Commonly known as: LASIX Take 1 tablet (40 mg total) by mouth daily as needed for edema (For a weight over 295 lbs).   insulin NPH-regular Human (70-30) 100 UNIT/ML injection Inject 20-62 Units into the skin 2 (two) times daily with a meal. 62 units in the morning and 20 units at bedtme   lisinopril-hydrochlorothiazide 20-25 MG tablet Commonly known as: ZESTORETIC Take 1 tablet by mouth daily.   metoCLOPramide 10 MG tablet Commonly known as: REGLAN Take 10 mg by mouth 2 (two) times daily. Per patient   metoprolol succinate 50 MG 24 hr tablet Commonly known as: Toprol XL Take 1 tablet (50 mg total) by mouth daily.   metoprolol tartrate 25 MG tablet Commonly known as: LOPRESSOR Take 1 table as needed for heart rate above 120bpm. What changed:  medication strength additional instructions   OneTouch Delica Plus [id number] Misc Apply topically.   OneTouch Verio Flex System w/Device Kit   OneTouch Verio test strip Generic drug: glucose blood 1 each 2 (two) times daily.   pantoprazole 40 MG tablet Commonly known as: PROTONIX Take 1 tablet (40 mg total) by mouth daily.   simvastatin 40 MG tablet Commonly known as: ZOCOR Take 1 tablet (40 mg total) by mouth daily at 6 PM.   simvastatin 40 MG tablet Commonly known as: ZOCOR Take 1 tablet (40 mg total) by mouth at bedtime.   spironolactone 25 MG tablet Commonly known as: ALDACTONE Take 1 tablet (25 mg total) by mouth daily as needed (For a weight over 295 lbs).   Vitamin D (Cholecalciferol) 25 MCG (1000 UT) Tabs Take 1,000 Units by mouth every morning.   warfarin 7.5 MG tablet Commonly known as:  COUMADIN Take as directed. If you are unsure how to take this medication, talk to your nurse or doctor. Original instructions: TAKE 1/2 TO 1 TABLET DAILY AS DIRECTED BY COUMADIN CLINIC What changed:  how much to take how to take this when to take this additional instructions           Outstanding Labs/Studies   None  Duration of Discharge Encounter   Greater than 30 minutes including physician time.  Jarrett Soho, PA 07/17/2021, 10:06 AM

## 2021-07-21 ENCOUNTER — Telehealth: Payer: Self-pay | Admitting: Cardiovascular Disease

## 2021-07-21 NOTE — Telephone Encounter (Signed)
Pharmacy reached out about the two prescriptions of the metoprolol listed for the patient and would like to clarify. Per Dr. Loletha Grayer:   Both prescriptions are correct and both should be filled.  There is a prescription for metoprolol succinate 50 mg daily to be taken as a scheduled medication.  There is also a prescription for metoprolol tartrate 25 mg that only needs to be taken <<as needed>> for excessively fast heart rates (over 120 bpm)

## 2021-07-23 ENCOUNTER — Ambulatory Visit: Payer: HMO

## 2021-07-23 ENCOUNTER — Telehealth: Payer: Self-pay | Admitting: Cardiovascular Disease

## 2021-07-23 NOTE — Telephone Encounter (Signed)
Spoke with mail order pharmacy. Advised that metoprolol succiante 50mg  QD is correct (was changed from tartrate) and that meto tartrate is used PRN now  Patient's Rx were sent to a local pharmacy on 10/21 after hospital discharge

## 2021-07-23 NOTE — Telephone Encounter (Signed)
Pt c/o medication issue:  1. Name of Medication: metoprolol succinate (TOPROL XL) 50 MG 24 hr tablet  2. How are you currently taking this medication (dosage and times per day)? 1 tablet in the morning, half a tablet in afternoon  3. Are you having a reaction (difficulty breathing--STAT)? no  4. What is your medication issue? Melissa from [facility] calling to clarify the patient's current prescription. She states they have on file for the patient to take one 50 mg tablet in the morning and a half tablet in the afternoon. She says they also received a 25 mg prescription from another provider with instructions to take one 25 mg tablet daily.  Ref [id number], Phone: [phone]

## 2021-07-27 ENCOUNTER — Other Ambulatory Visit: Payer: Self-pay

## 2021-07-27 ENCOUNTER — Ambulatory Visit
Admission: RE | Admit: 2021-07-27 | Discharge: 2021-07-27 | Disposition: A | Discharge status: Home / Self Care | Payer: HMO | Source: Ambulatory Visit | Attending: Internal Medicine | Admitting: Internal Medicine

## 2021-07-27 DIAGNOSIS — Z1231 Encounter for screening mammogram for malignant neoplasm of breast: Secondary | ICD-10-CM | POA: Diagnosis not present

## 2021-07-28 ENCOUNTER — Ambulatory Visit: Payer: HMO

## 2021-07-31 ENCOUNTER — Other Ambulatory Visit: Payer: Self-pay | Admitting: Cardiovascular Disease

## 2021-08-03 NOTE — Progress Notes (Signed)
Cardiology Clinic Note   Patient Name: [name] Date of Encounter: 08/04/2021  Primary Care Provider:  [name], MD Primary Cardiologist:  Sanda Klein, MD  Patient Profile    [name] 70 year old female presents the clinic today for follow-up evaluation of her tachycardia  Past Medical History    Past Medical History:  Diagnosis Date   Arthritis    "knees" (02/05/2016)   Cancer ([place]) 5/10   LUL Vats    CHF (congestive heart failure) ([place])    Coronary artery disease    GERD (gastroesophageal reflux disease)    tx meds   H/O hiatal hernia    History of blood transfusion "several"   last april 2015 s/p knee surgery (02/05/2016)   Hypercholesteremia    Hypertension    Lung cancer ([place])    2010, surgery 18% left lung no radiation or chemo   Non-STEMI (non-ST elevated myocardial infarction) ([place]) 04/07/2012   See cath results below   Paroxysmal atrial fibrillation ([place]) 04/07/2012   On Warfarin   Peripheral vascular disease ([place]) 8/12   Lt SFA PTA   Presence of stent in LAD coronary artery 04/07/2012   Xience Expedition DES 2.75 mm x 18 mm (dilated to 3.0 mm)   Sleep apnea    does not wear CPAP   Type II diabetes mellitus (HCC)    insulin dependent   Past Surgical History:  Procedure Laterality Date   ABDOMINAL HYSTERECTOMY  1990   BREAST EXCISIONAL BIOPSY Right    CARDIAC CATHETERIZATION  11/04/2008   patent RCA, LM, and Circ, nl EF   CARDIAC CATHETERIZATION  02/20/2002   patent coronaries with the only abnormality being a smooth luminla irregularity in the mid intermediate ramus branch no felt to be hemodynamically significant, nl LV   CARDIAC CATHETERIZATION N/A 02/05/2016   Procedure: Left Heart Cath and Coronary Angiography;  Surgeon: Leonie Man, MD;  Location: [place] [facility];  Service: Cardiovascular;  Laterality: N/A;   CARDIAC CATHETERIZATION N/A 02/05/2016   Procedure: Coronary Stent Intervention;  Surgeon: Leonie Man,  MD;  Location: [place] [facility];  Service: Cardiovascular;  Laterality: N/A;   CARDIOVERSION N/A 07/16/2021   Procedure: CARDIOVERSION;  Surgeon: Werner Lean, MD;  Location: [facility] [facility];  Service: Cardiovascular;  Laterality: N/A;   CATARACT EXTRACTION W/ INTRAOCULAR LENS  IMPLANT, BILATERAL  2013   CORONARY ANGIOPLASTY WITH STENT PLACEMENT  04/07/2012   Xience Expedition DES 2.63m x 18 mm (dilated to 3.0 mm) to the prox LAD    CORONARY ANGIOPLASTY WITH STENT PLACEMENT  2013; 2015   CORONARY ARTERY BYPASS GRAFT N/A 08/17/2017   Procedure: CORONARY ARTERY BYPASS GRAFTING (CABG) TIMES 1 USING LEFT INTERNAL MAMMARY ARTERY;  Surgeon: HMelrose Nakayama MD;  [place]  Service: Open Heart Surgery;  Laterality: N/A;   DILATION AND CURETTAGE OF UTERUS  <1990   ESOPHAGOGASTRODUODENOSCOPY N/A 05/23/2015   Procedure: ESOPHAGOGASTRODUODENOSCOPY (EGD);  Surgeon: PCarol Ada MD;  [facility];  Service: Endoscopy;  Laterality: N/A;   ESOPHAGOGASTRODUODENOSCOPY (EGD) WITH PROPOFOL N/A 06/13/2015   Procedure: ESOPHAGOGASTRODUODENOSCOPY (EGD) WITH PROPOFOL;  Surgeon: PCarol Ada MD;  [facility];  Service: Endoscopy;  Laterality: N/A;   ESOPHAGOGASTRODUODENOSCOPY (EGD) WITH PROPOFOL N/A 04/07/2018   Procedure: ESOPHAGOGASTRODUODENOSCOPY (EGD) WITH PROPOFOL;  Surgeon: HCarol Ada MD;  [facility];  Service: Endoscopy;  Laterality: N/A;  fluoroscopy is needed   FRACTURE SURGERY     HAMMER TOE SURGERY Bilateral 1993  with screws, on screw removed   JOINT REPLACEMENT     KNEE ARTHROSCOPY Bilateral    left x2, right x1   LAPAROSCOPIC CHOLECYSTECTOMY     LEFT HEART CATH AND CORONARY ANGIOGRAPHY N/A 08/11/2017   Procedure: LEFT HEART CATH AND CORONARY ANGIOGRAPHY;  Surgeon: Martinique, Peter M, MD;  Location: [facility];  Service: Cardiovascular;  Laterality: N/A;   LEFT HEART CATHETERIZATION WITH CORONARY ANGIOGRAM N/A 04/07/2012   Procedure: LEFT  HEART CATHETERIZATION WITH CORONARY ANGIOGRAM;  Surgeon: [name], MD;  Location: [facility];  Service: Cardiovascular;  Laterality: N/A;   LEFT HEART CATHETERIZATION WITH CORONARY ANGIOGRAM N/A 05/27/2014   Procedure: LEFT HEART CATHETERIZATION WITH CORONARY ANGIOGRAM;  Surgeon: [name], MD; LAD 99% ISR, CFX 50-60%, RCA (dominant) no sig dz, EF 60%   LOWER EXTREMITY ANGIOGRAM  05/17/11   directional atherectomy to the prox L SFA using a LX Man TurboHawk, ballooned with a [place] balloon    ORIF ANKLE FRACTURE  07/09/2012   Procedure: OPEN REDUCTION INTERNAL FIXATION (ORIF) ANKLE FRACTURE;  Surgeon: Meredith Pel, MD;  [facility];  Service: Orthopedics;  Laterality: Left;  open reduction internal fixation trimalleolar ankle fracture medial malleolous fixation   PERCUTANEOUS CORONARY STENT INTERVENTION (PCI-S) N/A 04/07/2012   Procedure: PERCUTANEOUS CORONARY STENT INTERVENTION (PCI-S);  Surgeon: [name], MD;  Location: [facility] CATH LAB;  Service: Cardiovascular;  Laterality: N/A;   PERCUTANEOUS CORONARY STENT INTERVENTION (PCI-S)  05/27/2014   Procedure: PERCUTANEOUS CORONARY STENT INTERVENTION (PCI-S);  Surgeon: [name], MD; 3 mm x 12 mm long Xience Xpedition DES to the proximal LAD   SAVORY DILATION N/A 06/13/2015   Procedure: SAVORY DILATION;  Surgeon: Carol Ada, MD;  [facility];  Service: Endoscopy;  Laterality: N/A;   SAVORY DILATION N/A 04/07/2018   Procedure: SAVORY DILATION;  Surgeon: Carol Ada, MD;  [facility];  Service: Endoscopy;  Laterality: N/A;   TEE WITHOUT CARDIOVERSION N/A 08/17/2017   Procedure: TRANSESOPHAGEAL ECHOCARDIOGRAM (TEE);  Surgeon: Melrose Nakayama, MD;  Location: [place];  Service: Open Heart Surgery;  Laterality: N/A;   TEE WITHOUT CARDIOVERSION N/A 07/16/2021   Procedure: TRANSESOPHAGEAL ECHOCARDIOGRAM (TEE);  Surgeon: Werner Lean, MD;  Location: [facility] ENDOSCOPY;  Service:  Cardiovascular;  Laterality: N/A;   TONSILLECTOMY     TOTAL KNEE ARTHROPLASTY Left 2003   TOTAL KNEE REVISION Left 11/05/2013   Procedure: LEFT TOTAL KNEE RESECTION;  Surgeon: Mauri Pole, MD;  [facility];  Service: Orthopedics;  Laterality: Left;   TOTAL KNEE REVISION Left 2007   opened in 2006 and cleaned out, 2007 revision   TOTAL KNEE REVISION Left 12/31/2013   Procedure: RE-INPLANTATION LEFT TOTAL KNEE ;  Surgeon: Mauri Pole, MD;  [facility];  Service: Orthopedics;  Laterality: Left;   VIDEO ASSISTED THORACOSCOPY (VATS)/ LOBECTOMY  01/2009    Allergies  Allergies  Allergen Reactions   Levofloxacin Itching   Morphine And Related Itching    Pt reports oxycodone history with no allergy.    History of Present Illness    [name] has a PMH of coronary artery disease, peripheral arterial disease, chronic diastolic CHF, paroxysmal atrial fibrillation, type 2 diabetes on insulin, palpitations, and morbid obesity.  She underwent CABG 11/18 for recurrent in-stent restenosis with multiple PCI.  Her PCI with DES to LAD 2013, DES to distal LAD 2015, 4/17 received DES to proximal LAD, 11/18 was noted to have 3 stenosis and  referred for single-vessel CABG.  She also underwent left superficial femoral artery arthrectomy.  She woke up early on 10/18 to check on her furnace which was not working.  She noticed that she felt sick and had palpitations.  She denied chest pain.  She checked her pulse using her pulse oximeter and noted that her pulse was in the 240 bpm range.  She checked her pulse again a few minutes later and noticed that her pulse was 120s.  It continued to sustain in the 120s and she presented to the emergency department for evaluation.  Her EKG showed atrial fibrillation versus atypical atrial flutter and the same pattern was noted on her telemetry.  She reported that she had not taken her/received her medications the previous evening.  The  discrepancies/inaccuracies with pulse oximetry and the importance of medication compliance were discussed.  Her INR was checked and noted to be 1.5 on admission.  She was bridged with Lovenox.  Her echocardiogram showed normal EF.  She remained stable on her metoprolol 50 mg daily.  She underwent successful TEE and cardioversion.  She maintained sinus rhythm overnight and her INR improved to 2.3 and her hemoglobin was noted to be 10.9.  Her Lovenox was discontinued with plan to continue her home warfarin.  Her metoprolol tartrate was changed to metoprolol succinate.  At discharge her heart rate was stable in the 80s.  She was advised to use an extra metoprolol tartrate 25 mg as needed.  Her BNP was mildly elevated which was felt to be a possible cause of her converting to atrial flutter and her mild decompensation.  She was given p.o. Lasix 80 mg and given home dose of 40 mg as needed.  Her HCTZ was continued.  She received sliding scale insulin during her hospital admission.  She was asked to resume her home medication regimen at discharge.  She presents to the clinic today for follow-up evaluation states she feels tired this morning.  She is not used to getting up this early.  She reports that she has not been taking her furosemide or spironolactone daily.  Her weight is stable today.  I recommended that she take her spironolactone and furosemide 3 days/week, Monday Wednesday Friday.  Her EKG today shows normal sinus rhythm 84 bpm.  Her blood pressure is well controlled at 134/66.  She reports compliance with her warfarin and denies bleeding issues.  She reports that she has a appointment with the wound center for continued evaluation and treatment of her lower extremities.  I will give her the salty 6 diet sheet, give her the [place] support stocking sheet, have her increase her physical activity as tolerated, and plan follow-up in 3 to 4 months.  Today she denies chest pain, shortness of breath, lower  extremity edema, fatigue, palpitations, melena, hematuria, hemoptysis, diaphoresis, weakness, presyncope, syncope, orthopnea, and PND.   Home Medications    Prior to Admission medications   Medication Sig Start Date End Date Taking? Authorizing Provider  Blood Glucose Monitoring Suppl ([place]) w/Device KIT  07/23/20   [provider]  Continuous Blood Gluc Receiver (FREESTYLE LIBRE 2 READER) [place] See admin instructions. 11/18/20   [provider]  furosemide (LASIX) 40 MG tablet Take 1 tablet (40 mg total) by mouth daily as needed for edema (For a weight over 295 lbs). 12/18/20   Croitoru, Mihai, MD  insulin NPH-regular Human (70-30) 100 UNIT/ML injection Inject 20-62 Units into the skin 2 (two) times daily with  a meal. 62 units in the morning and 20 units at bedtme    [provider]  Lancets (ONETOUCH DELICA PLUS [id number]) [place] Apply topically. 08/13/20   [provider]  lisinopril-hydrochlorothiazide (PRINZIDE,ZESTORETIC) 20-25 MG per tablet Take 1 tablet by mouth daily.    [provider]  metoCLOPramide (REGLAN) 10 MG tablet Take 10 mg by mouth 2 (two) times daily. Per patient    [provider]  metoprolol succinate (TOPROL XL) 50 MG 24 hr tablet Take 1 tablet (50 mg total) by mouth daily. 07/17/21 07/17/22  Leanor Kail, PA  metoprolol tartrate (LOPRESSOR) 25 MG tablet Take 1 table as needed for heart rate above 120bpm. 07/17/21   Bhagat, Bhavinkumar, PA  ONETOUCH VERIO test strip 1 each 2 (two) times daily. 10/11/20   [provider]  pantoprazole (PROTONIX) 40 MG tablet Take 1 tablet (40 mg total) by mouth daily. 06/17/21   Croitoru, Mihai, MD  simvastatin (ZOCOR) 40 MG tablet TAKE 1 TABLET BY MOUTH DAILY AT 6 PM. 07/31/21   Croitoru, Dani Gobble, MD  spironolactone (ALDACTONE) 25 MG tablet Take 1 tablet (25 mg total) by mouth daily as needed (For a weight over 295 lbs). 12/18/20   Croitoru, Mihai, MD   Vitamin D, Cholecalciferol, 1000 units TABS Take 1,000 Units by mouth every morning.     [provider]  warfarin (COUMADIN) 7.5 MG tablet TAKE 1/2 TO 1 TABLET DAILY AS DIRECTED BY COUMADIN CLINIC Patient taking differently: Take 3.75-7.5 mg by mouth as directed. TAKE 1 TABLET (7.5 MG) ON (TUES & THURS) & TAKE 1/2 TABLET (3.75 MG) ALL OTHER DAYS AS DIRECTED BY COUMADIN CLINIC 05/26/21   Deberah Pelton, NP    Family History    Family History  Problem Relation Age of Onset   Hypertension Mother    Coronary artery disease Brother    Hypertension Sister    She indicated that her mother is deceased. She indicated that her father is deceased. She indicated that the status of her sister is unknown. She indicated that the status of her brother is unknown.  Social History    Social History   Socioeconomic History   Marital status: Single    Spouse name: Not on file   Number of children: Not on file   Years of education: Not on file   Highest education level: Not on file  Occupational History   Occupation: Retired  Tobacco Use   Smoking status: Former    Packs/day: 1.00    Years: 20.00    Pack years: 20.00    Types: Cigarettes    Quit date: 09/27/1984    Years since quitting: 36.8   Smokeless tobacco: Never  Vaping Use   Vaping Use: Never used  Substance and Sexual Activity   Alcohol use: No   Drug use: No   Sexual activity: Not on file  Other Topics Concern   Not on file  Social History Narrative   Patient lives alone.   Social Determinants of Health   Financial Resource Strain: Not on file  Food Insecurity: Not on file  Transportation Needs: Not on file  Physical Activity: Not on file  Stress: Not on file  Social Connections: Not on file  Intimate Partner Violence: Not on file     Review of Systems    General:  No chills, fever, night sweats or weight changes.  Cardiovascular:  No chest pain, dyspnea on exertion, edema, orthopnea, palpitations,  paroxysmal nocturnal dyspnea. Dermatological: No rash,  lesions/masses Respiratory: No cough, dyspnea Urologic: No hematuria, dysuria Abdominal:   No nausea, vomiting, diarrhea, bright red blood per rectum, melena, or hematemesis Neurologic:  No visual changes, wkns, changes in mental status. All other systems reviewed and are otherwise negative except as noted above.  Physical Exam    VS:  BP 134/66 (BP Location: Left Arm, Patient Position: Sitting, Cuff Size: Large)   Pulse 86   Ht $R'5\' 3"'bc$  (1.6 m)   Wt 282 lb 9.6 oz (128.2 kg)   SpO2 96%   BMI 50.06 kg/m  , BMI Body mass index is 50.06 kg/m. GEN: Well nourished, well developed, in no acute distress. HEENT: normal. Neck: Supple, no JVD, carotid bruits, or masses. Cardiac: RRR, no murmurs, rubs, or gallops. No clubbing, cyanosis, edema.  Radials/DP/PT 2+ and equal bilaterally.  Respiratory:  Respirations regular and unlabored, clear to auscultation bilaterally. GI: Soft, nontender, nondistended, BS + x 4. MS: no deformity or atrophy. Skin: warm and dry, no rash.  1 cm x 1 cm blister left anterior shin Neuro:  Strength and sensation are intact. Psych: Normal affect.  Accessory Clinical Findings    Recent Labs: 07/14/2021: B Natriuretic Peptide 163.5; TSH 2.181 07/15/2021: ALT 26; BUN 14; Creatinine, Ser 1.10; Potassium 3.9; Sodium 134 07/17/2021: Hemoglobin 10.9; Platelets 234   Recent Lipid Panel    Component Value Date/Time   CHOL 115 07/15/2021 0348   TRIG 59 07/15/2021 0348   HDL 44 07/15/2021 0348   CHOLHDL 2.6 07/15/2021 0348   VLDL 12 07/15/2021 0348   LDLCALC 59 07/15/2021 0348   LDLDIRECT 143 (H) 10/10/2008 2042    ECG personally reviewed by me today- normal sinus rhythm left axis deviation right bundle branch block LVH 84 BPM  Assessment & Plan   1.  Atrial flutter-EKG today shows normal sinus rhythm left axis deviation right bundle branch block LVH 84 BPM.  Reports compliance with warfarin and denies  bleeding issues.  Recently admitted to the hospital on 07/15/2021 and discharged on 07/17/2021.  She was noted to be in atrial flutter versus coarse atrial fibrillation on telemetry and EKG.  She underwent TEE and DCCV.  She converted to sinus rhythm. Continue metoprolol, warfarin Heart healthy low-sodium diet-salty 6 given Increase physical activity as tolerated Avoid triggers caffeine, chocolate, EtOH, dehydration etc.  Chronic diastolic CHF-euvolemic today.  No increased DOE or activity intolerance.  Weight stable.  1 cm x 1 cm anterior left shin blister, nondraining Continue metoprolol, furosemide, spironolactone, furosimide Heart healthy low-sodium diet-salty 6 given Increase physical activity as tolerated Follow-up with wound care-has made an appointment. Lower extremity support stockings-[place] support stocking sheet given  Essential hypertension-BP today 134/66.  Well-controlled at home. Continue metoprolol, lisinopril, HCTZ, spironolactone Heart healthy low-sodium diet-salty 6 given Increase physical activity as tolerated  Diabetes mellitus type 2-A1c 8.9 on 07/16/2021 Continue insulin Heart healthy low-sodium carb modified diet Increase physical activity as tolerated Follows with PCP  Disposition: Follow-up with Dr. Sallyanne Kuster in 3-4 months.  Jossie Ng. Stelios Kirby NP-C    08/04/2021, 8:28 AM [place] [address] Office [phone] Fax [phone]  Notice: This dictation was prepared with Dragon dictation along with smaller phrase technology. Any transcriptional errors that result from this process are unintentional and may not be corrected upon review.  I spent 14 minutes examining this patient, reviewing medications, and using patient centered shared decision making involving her cardiac care.  Prior to her visit I spent greater than 20 minutes reviewing  her past medical history,  medications, and prior cardiac tests.

## 2021-08-04 ENCOUNTER — Other Ambulatory Visit: Payer: Self-pay

## 2021-08-04 ENCOUNTER — Encounter: Payer: Self-pay | Admitting: General Practice

## 2021-08-04 ENCOUNTER — Ambulatory Visit (INDEPENDENT_AMBULATORY_CARE_PROVIDER_SITE_OTHER): Payer: HMO | Admitting: *Deleted

## 2021-08-04 ENCOUNTER — Ambulatory Visit: Payer: HMO | Admitting: General Practice

## 2021-08-04 VITALS — BP 134/66 | HR 86 | Ht 63.0 in | Wt 282.6 lb

## 2021-08-04 DIAGNOSIS — I10 Essential (primary) hypertension: Secondary | ICD-10-CM

## 2021-08-04 DIAGNOSIS — I480 Paroxysmal atrial fibrillation: Secondary | ICD-10-CM

## 2021-08-04 DIAGNOSIS — E1151 Type 2 diabetes mellitus with diabetic peripheral angiopathy without gangrene: Secondary | ICD-10-CM

## 2021-08-04 DIAGNOSIS — Z7901 Long term (current) use of anticoagulants: Secondary | ICD-10-CM | POA: Diagnosis not present

## 2021-08-04 DIAGNOSIS — I5032 Chronic diastolic (congestive) heart failure: Secondary | ICD-10-CM | POA: Diagnosis not present

## 2021-08-04 LAB — POCT INR: INR: 1.7 — AB (ref 2.0–3.0)

## 2021-08-04 MED ORDER — FUROSEMIDE 40 MG PO TABS: 40.0000 mg | ORAL_TABLET | ORAL | 3 refills | Status: DC

## 2021-08-04 MED ORDER — SPIRONOLACTONE 25 MG PO TABS: 25.0000 mg | ORAL_TABLET | ORAL | 6 refills | Status: DC

## 2021-08-04 MED ORDER — SPIRONOLACTONE 25 MG PO TABS: 25.0000 mg | ORAL_TABLET | ORAL | 3 refills | Status: DC

## 2021-08-04 MED ORDER — FUROSEMIDE 40 MG PO TABS: 40.0000 mg | ORAL_TABLET | ORAL | 6 refills | Status: DC

## 2021-08-04 NOTE — Patient Instructions (Signed)
Medication Instructions:  TAKE SPIONOLACTONE AND FUROSEMIDE Monday, [facility] AND FRIDAY  *If you need a refill on your cardiac medications before your next appointment, please call your pharmacy*  Lab Work:   Testing/Procedures:  none    none  Special Instructions PLEASE SEE HEART HEALTHY DIET-ATTACHED   Please try to avoid these triggers: Do not use any products that have nicotine or tobacco in them. These include cigarettes, e-cigarettes, and chewing tobacco. If you need help quitting, ask your doctor. Eat heart-healthy foods. Talk with your doctor about the right eating plan for you. Exercise regularly as told by your doctor. Stay hydrated Do not drink alcohol, Caffeine or chocolate. Lose weight if you are overweight. Do not use drugs, including cannabis PLEASE PURCHASE AND WEAR COMPRESSION STOCKINGS DAILY AND TAKE OFF AT BEDTIME. Compression stockings are elastic socks that squeeze the legs. They help to increase blood flow to the legs and to decrease swelling in the legs from fluid retention, and reduce the chance of developing blood clots in the lower legs. Please put on in the AM when dressing and off at night when dressing for bed.  LET THEM KNOW THAT YOU NEED KNEE HIGH'S WITH COMPRESSION OF 15-20 mmhg.  [employer]  [place] ([place] [phone]); [place], [place] [address]; [phone]  EMAIL   [email or web address].  PLEASE MAKE SURE TO ELEVATE YOUR FEET & LEGS WHILE SITTING, THIS WILL HELP WITH THE SWELLING ALSO.   Follow-Up: Your next appointment:  3-4 month(s) In Person with Sanda Klein, MD  OR IF UNAVAILABLE JESSE CLEAVER, FNP-C   At [facility], you and your health needs are our priority.  As part of our continuing mission to provide you with exceptional heart care, we have created designated Provider Care Teams.  These Care Teams include your primary Cardiologist (physician) and Advanced Practice Providers (APPs -  Physician Assistants and Nurse  Practitioners) who all work together to provide you with the care you need, when you need it.  We recommend signing up for the patient portal called "MyChart".  Sign up information is provided on this After Visit Summary.  MyChart is used to connect with patients for Virtual Visits (Telemedicine).  Patients are able to view lab/test results, encounter notes, upcoming appointments, etc.  Non-urgent messages can be sent to your provider as well.   To learn more about what you can do with MyChart, go to [email or web address].            Heart-Healthy Eating Plan Heart-healthy meal planning includes: Eating less unhealthy fats. Eating more healthy fats. Making other changes in your diet. Talk with your doctor or a diet specialist (dietitian) to create an eating plan that is right for you. What is my plan? Your doctor may recommend an eating plan that includes: Total fat: ______% or less of total calories a day. Saturated fat: ______% or less of total calories a day. Cholesterol: less than _________mg a day. What are tips for following this plan? Cooking Avoid frying your food. Try to bake, boil, grill, or broil it instead. You can also reduce fat by: Removing the skin from poultry. Removing all visible fats from meats. Steaming vegetables in water or broth. Meal planning  At meals, divide your plate into four equal parts: Fill one-half of your plate with vegetables and green salads. Fill one-fourth of your plate with whole grains. Fill one-fourth of your plate with lean protein foods. Eat 4-5 servings of vegetables per day. A serving of vegetables  is: 1 cup of raw or cooked vegetables. 2 cups of raw leafy greens. Eat 4-5 servings of fruit per day. A serving of fruit is: 1 medium whole fruit.  cup of dried fruit.  cup of fresh, frozen, or canned fruit.  cup of 100% fruit juice. Eat more foods that have soluble fiber. These are apples, broccoli, carrots, beans, peas, and barley.  Try to get 20-30 g of fiber per day. Eat 4-5 servings of nuts, legumes, and seeds per week: 1 serving of dried beans or legumes equals  cup after being cooked. 1 serving of nuts is  cup. 1 serving of seeds equals 1 tablespoon. General information Eat more home-cooked food. Eat less restaurant, buffet, and fast food. Limit or avoid alcohol. Limit foods that are high in starch and sugar. Avoid fried foods. Lose weight if you are overweight. Keep track of how much salt (sodium) you eat. This is important if you have high blood pressure. Ask your doctor to tell you more about this. Try to add vegetarian meals each week. Fats Choose healthy fats. These include olive oil and canola oil, flaxseeds, walnuts, almonds, and seeds. Eat more omega-3 fats. These include salmon, mackerel, sardines, tuna, flaxseed oil, and ground flaxseeds. Try to eat fish at least 2 times each week. Check food labels. Avoid foods with trans fats or high amounts of saturated fat. Limit saturated fats. These are often found in animal products, such as meats, butter, and cream. These are also found in plant foods, such as palm oil, palm kernel oil, and coconut oil. Avoid foods with partially hydrogenated oils in them. These have trans fats. Examples are stick margarine, some tub margarines, cookies, crackers, and other baked goods. What foods can I eat? Fruits All fresh, canned (in natural juice), or frozen fruits. Vegetables Fresh or frozen vegetables (raw, steamed, roasted, or grilled). Green salads. Grains Most grains. Choose whole wheat and whole grains most of the time. Rice and pasta, including brown rice and pastas made with whole wheat. Meats and other proteins Lean, well-trimmed beef, veal, pork, and lamb. Chicken and [place] without skin. All fish and shellfish. Wild duck, rabbit, pheasant, and venison. Egg whites or low-cholesterol egg substitutes. Dried beans, peas, lentils, and tofu. Seeds and most  nuts. Dairy Low-fat or nonfat cheeses, including ricotta and mozzarella. Skim or 1% milk that is liquid, powdered, or evaporated. Buttermilk that is made with low-fat milk. Nonfat or low-fat yogurt. Fats and oils Non-hydrogenated (trans-free) margarines. Vegetable oils, including soybean, sesame, sunflower, olive, peanut, safflower, corn, canola, and cottonseed. Salad dressings or mayonnaise made with a vegetable oil. Beverages Mineral water. Coffee and tea. Diet carbonated beverages. Sweets and desserts Sherbet, gelatin, and fruit ice. Small amounts of dark chocolate. Limit all sweets and desserts. Seasonings and condiments All seasonings and condiments. The items listed above may not be a complete list of foods and drinks you can eat. Contact a dietitian for more options. What foods should I avoid? Fruits Canned fruit in heavy syrup. Fruit in cream or butter sauce. Fried fruit. Limit coconut. Vegetables Vegetables cooked in cheese, cream, or butter sauce. Fried vegetables. Grains Breads that are made with saturated or trans fats, oils, or whole milk. Croissants. Sweet rolls. Donuts. High-fat crackers, such as cheese crackers. Meats and other proteins Fatty meats, such as hot dogs, ribs, sausage, bacon, rib-eye roast or steak. High-fat deli meats, such as salami and bologna. Caviar. Domestic duck and goose. Organ meats, such as liver. Dairy Cream, sour cream, cream  cheese, and creamed cottage cheese. Whole-milk cheeses. Whole or 2% milk that is liquid, evaporated, or condensed. Whole buttermilk. Cream sauce or high-fat cheese sauce. Yogurt that is made from whole milk. Fats and oils Meat fat, or shortening. Cocoa butter, hydrogenated oils, palm oil, coconut oil, palm kernel oil. Solid fats and shortenings, including bacon fat, salt pork, lard, and butter. Nondairy cream substitutes. Salad dressings with cheese or sour cream. Beverages Regular sodas and juice drinks with added  sugar. Sweets and desserts Frosting. Pudding. Cookies. Cakes. Pies. Milk chocolate or white chocolate. Buttered syrups. Full-fat ice cream or ice cream drinks. The items listed above may not be a complete list of foods and drinks to avoid. Contact a dietitian for more information. Summary Heart-healthy meal planning includes eating less unhealthy fats, eating more healthy fats, and making other changes in your diet. Eat a balanced diet. This includes fruits and vegetables, low-fat or nonfat dairy, lean protein, nuts and legumes, whole grains, and heart-healthy oils and fats. This information is not intended to replace advice given to you by your health care provider. Make sure you discuss any questions you have with your health care provider. Document Revised: 01/22/2021 Document Reviewed: 01/22/2021 Elsevier Patient Education  2022 [employer].

## 2021-08-04 NOTE — Addendum Note (Signed)
Addended by: Waylan Rocher on: 08/04/2021 08:50 AM   Modules accepted: Orders

## 2021-08-04 NOTE — Patient Instructions (Addendum)
Description   -Take 1 tablet of warfarin today and 1 tablet of warfarin tomorrow.   -Then continue to take warfarin 1/2 a tablet daily except for 1 tablet on Tuesday and Thursdays. Recheck INR in 3 weeks. [facility] [phone]

## 2021-08-05 ENCOUNTER — Encounter (HOSPITAL_BASED_OUTPATIENT_CLINIC_OR_DEPARTMENT_OTHER): Payer: HMO | Attending: Internal Medicine | Admitting: Physician Assistant

## 2021-08-05 DIAGNOSIS — Z7901 Long term (current) use of anticoagulants: Secondary | ICD-10-CM | POA: Insufficient documentation

## 2021-08-05 DIAGNOSIS — L97822 Non-pressure chronic ulcer of other part of left lower leg with fat layer exposed: Secondary | ICD-10-CM | POA: Diagnosis not present

## 2021-08-05 DIAGNOSIS — L97812 Non-pressure chronic ulcer of other part of right lower leg with fat layer exposed: Secondary | ICD-10-CM | POA: Diagnosis not present

## 2021-08-05 DIAGNOSIS — I10 Essential (primary) hypertension: Secondary | ICD-10-CM | POA: Insufficient documentation

## 2021-08-05 DIAGNOSIS — I890 Lymphedema, not elsewhere classified: Secondary | ICD-10-CM | POA: Diagnosis not present

## 2021-08-05 DIAGNOSIS — I872 Venous insufficiency (chronic) (peripheral): Secondary | ICD-10-CM | POA: Insufficient documentation

## 2021-08-05 DIAGNOSIS — Z79899 Other long term (current) drug therapy: Secondary | ICD-10-CM | POA: Insufficient documentation

## 2021-08-05 DIAGNOSIS — E1151 Type 2 diabetes mellitus with diabetic peripheral angiopathy without gangrene: Secondary | ICD-10-CM | POA: Insufficient documentation

## 2021-08-05 DIAGNOSIS — E11622 Type 2 diabetes mellitus with other skin ulcer: Secondary | ICD-10-CM | POA: Insufficient documentation

## 2021-08-05 NOTE — Progress Notes (Signed)
Etiology: Wound Location: Left, Anterior Lower Leg Wound Open Wounding Event:  Blister Status: Date Acquired: 06/23/2021 Comorbid Cataracts, Sleep Apnea, Arrhythmia, Congestive Heart Failure, Weeks Of Treatment: 0 History: Coronary Artery Disease, Hypertension, Peripheral Venous Clustered Wound: No Disease, Type II Diabetes, Osteoarthritis, Neuropathy Photos Wound Measurements Length: (cm) 4.5 Width: (cm) 2.5 Depth: (cm) 0.1 Area: (cm) 8.836 Volume: (cm) 0.884 % Reduction in Area: 0% % Reduction in Volume: 0% Epithelialization: None Tunneling: No Undermining: No Wound Description Classification: Full Thickness Without Exposed Support Structures Wound Margin: Distinct, outline attached Exudate Amount: Medium Exudate Type: Serous Exudate Color: amber Foul Odor After Cleansing: No Slough/Fibrino Yes Wound Bed Granulation Amount: Large (67-100%) Exposed Structure Granulation Quality: Red Fascia Exposed: No Necrotic Amount: Small (1-33%) Fat Layer (Subcutaneous Tissue) Exposed: Yes Necrotic Quality: Adherent Slough Tendon Exposed: No Muscle Exposed: No Joint Exposed: No Bone Exposed: No Treatment Notes Wound #1 (Lower Leg) Wound Laterality: Left, Anterior Cleanser Soap and Water Discharge Instruction: May shower and wash wound with dial antibacterial soap and water prior to dressing change. Wound Cleanser Discharge Instruction: Cleanse the wound with wound cleanser prior to applying a clean dressing using gauze sponges, not tissue or cotton balls. Peri-Wound Care Topical Primary Dressing KerraCel Ag Gelling Fiber Dressing, 4x5 in (silver alginate) Discharge Instruction: Apply silver alginate to wound bed as instructed Secondary Dressing ABD Pad, 5x9 Discharge Instruction: Apply over primary dressing as directed. Secured With Compression Wrap Unnaboot w/Calamine, 4x10 (in/yd) Discharge Instruction: Apply Unnaboot as directed. Compression Stockings Add-Ons Electronic Signature(s) Signed: 08/05/2021 5:39:20 PM By: Lorrin Jackson Entered By:  Lorrin Jackson on 08/05/2021 08:35:45 -------------------------------------------------------------------------------- Wound Assessment Details Patient Name: Date of Service: Brandi Morgans A. 08/05/2021 7:30 A M Medical Record Number: [id number] Patient Account Number: [id number] Date of Birth/Sex: Treating RN: [birth year] (70 y.o. Female) Lorrin Jackson Primary Care Tudor Chandley: [name] Other Clinician: Referring Lavenia Stumpo: Treating Sheldon Sem/Extender: Sharalyn Ink in Treatment: 0 Wound Status Wound Number: 2 Primary Lymphedema Etiology: Wound Location: Right, Medial Lower Leg Wound Open Wounding Event: Blister Status: Date Acquired: 06/23/2021 Comorbid Cataracts, Sleep Apnea, Arrhythmia, Congestive Heart Failure, Weeks Of Treatment: 0 History: Coronary Artery Disease, Hypertension, Peripheral Venous Clustered Wound: No Disease, Type II Diabetes, Osteoarthritis, Neuropathy Photos Wound Measurements Length: (cm) 0.7 Width: (cm) 0.5 Depth: (cm) 0.1 Area: (cm) 0.275 Volume: (cm) 0.027 % Reduction in Area: 0% % Reduction in Volume: 0% Epithelialization: None Tunneling: No Undermining: No Wound Description Classification: Full Thickness Without Exposed Support Structures Wound Margin: Distinct, outline attached Exudate Amount: Medium Exudate Type: Serous Exudate Color: amber Foul Odor After Cleansing: No Slough/Fibrino No Wound Bed Granulation Amount: Large (67-100%) Exposed Structure Granulation Quality: Red Fascia Exposed: No Necrotic Amount: None Present (0%) Fat Layer (Subcutaneous Tissue) Exposed: Yes Tendon Exposed: No Muscle Exposed: No Joint Exposed: No Bone Exposed: No Treatment Notes Wound #2 (Lower Leg) Wound Laterality: Right, Medial Cleanser Soap and Water Discharge Instruction: May shower and wash wound with dial antibacterial soap and water prior to dressing change. Wound Cleanser Discharge Instruction: Cleanse the wound with  wound cleanser prior to applying a clean dressing using gauze sponges, not tissue or cotton balls. Peri-Wound Care Topical Primary Dressing KerraCel Ag Gelling Fiber Dressing, 4x5 in (silver alginate) Discharge Instruction: Apply silver alginate to wound bed as instructed Secondary Dressing ABD Pad, 5x9 Discharge Instruction: Apply over primary dressing as directed. Secured With Compression Wrap Unnaboot w/Calamine, 4x10 (in/yd) Discharge Instruction: Apply Unnaboot as directed. Compression Stockings Add-Ons Electronic Signature(s) Signed: 08/05/2021 5:39:20 PM By: Onnie Boer,  Etiology: Wound Location: Left, Anterior Lower Leg Wound Open Wounding Event:  Blister Status: Date Acquired: 06/23/2021 Comorbid Cataracts, Sleep Apnea, Arrhythmia, Congestive Heart Failure, Weeks Of Treatment: 0 History: Coronary Artery Disease, Hypertension, Peripheral Venous Clustered Wound: No Disease, Type II Diabetes, Osteoarthritis, Neuropathy Photos Wound Measurements Length: (cm) 4.5 Width: (cm) 2.5 Depth: (cm) 0.1 Area: (cm) 8.836 Volume: (cm) 0.884 % Reduction in Area: 0% % Reduction in Volume: 0% Epithelialization: None Tunneling: No Undermining: No Wound Description Classification: Full Thickness Without Exposed Support Structures Wound Margin: Distinct, outline attached Exudate Amount: Medium Exudate Type: Serous Exudate Color: amber Foul Odor After Cleansing: No Slough/Fibrino Yes Wound Bed Granulation Amount: Large (67-100%) Exposed Structure Granulation Quality: Red Fascia Exposed: No Necrotic Amount: Small (1-33%) Fat Layer (Subcutaneous Tissue) Exposed: Yes Necrotic Quality: Adherent Slough Tendon Exposed: No Muscle Exposed: No Joint Exposed: No Bone Exposed: No Treatment Notes Wound #1 (Lower Leg) Wound Laterality: Left, Anterior Cleanser Soap and Water Discharge Instruction: May shower and wash wound with dial antibacterial soap and water prior to dressing change. Wound Cleanser Discharge Instruction: Cleanse the wound with wound cleanser prior to applying a clean dressing using gauze sponges, not tissue or cotton balls. Peri-Wound Care Topical Primary Dressing KerraCel Ag Gelling Fiber Dressing, 4x5 in (silver alginate) Discharge Instruction: Apply silver alginate to wound bed as instructed Secondary Dressing ABD Pad, 5x9 Discharge Instruction: Apply over primary dressing as directed. Secured With Compression Wrap Unnaboot w/Calamine, 4x10 (in/yd) Discharge Instruction: Apply Unnaboot as directed. Compression Stockings Add-Ons Electronic Signature(s) Signed: 08/05/2021 5:39:20 PM By: Lorrin Jackson Entered By:  Lorrin Jackson on 08/05/2021 08:35:45 -------------------------------------------------------------------------------- Wound Assessment Details Patient Name: Date of Service: Brandi Morgans A. 08/05/2021 7:30 A M Medical Record Number: [id number] Patient Account Number: [id number] Date of Birth/Sex: Treating RN: [birth year] (70 y.o. Female) Lorrin Jackson Primary Care Tudor Chandley: [name] Other Clinician: Referring Lavenia Stumpo: Treating Sheldon Sem/Extender: Sharalyn Ink in Treatment: 0 Wound Status Wound Number: 2 Primary Lymphedema Etiology: Wound Location: Right, Medial Lower Leg Wound Open Wounding Event: Blister Status: Date Acquired: 06/23/2021 Comorbid Cataracts, Sleep Apnea, Arrhythmia, Congestive Heart Failure, Weeks Of Treatment: 0 History: Coronary Artery Disease, Hypertension, Peripheral Venous Clustered Wound: No Disease, Type II Diabetes, Osteoarthritis, Neuropathy Photos Wound Measurements Length: (cm) 0.7 Width: (cm) 0.5 Depth: (cm) 0.1 Area: (cm) 0.275 Volume: (cm) 0.027 % Reduction in Area: 0% % Reduction in Volume: 0% Epithelialization: None Tunneling: No Undermining: No Wound Description Classification: Full Thickness Without Exposed Support Structures Wound Margin: Distinct, outline attached Exudate Amount: Medium Exudate Type: Serous Exudate Color: amber Foul Odor After Cleansing: No Slough/Fibrino No Wound Bed Granulation Amount: Large (67-100%) Exposed Structure Granulation Quality: Red Fascia Exposed: No Necrotic Amount: None Present (0%) Fat Layer (Subcutaneous Tissue) Exposed: Yes Tendon Exposed: No Muscle Exposed: No Joint Exposed: No Bone Exposed: No Treatment Notes Wound #2 (Lower Leg) Wound Laterality: Right, Medial Cleanser Soap and Water Discharge Instruction: May shower and wash wound with dial antibacterial soap and water prior to dressing change. Wound Cleanser Discharge Instruction: Cleanse the wound with  wound cleanser prior to applying a clean dressing using gauze sponges, not tissue or cotton balls. Peri-Wound Care Topical Primary Dressing KerraCel Ag Gelling Fiber Dressing, 4x5 in (silver alginate) Discharge Instruction: Apply silver alginate to wound bed as instructed Secondary Dressing ABD Pad, 5x9 Discharge Instruction: Apply over primary dressing as directed. Secured With Compression Wrap Unnaboot w/Calamine, 4x10 (in/yd) Discharge Instruction: Apply Unnaboot as directed. Compression Stockings Add-Ons Electronic Signature(s) Signed: 08/05/2021 5:39:20 PM By: Onnie Boer,  Assessment Pulses: Dorsalis Pedis Palpable: [Left:No Yes] [Right:No Yes] Notes Bilat ABI's Non Compressible Electronic Signature(s) Signed: 08/05/2021 5:39:20 PM By: Lorrin Jackson Entered By: Lorrin Jackson on 08/05/2021 08:36:48 -------------------------------------------------------------------------------- Multi-Disciplinary Care Plan Details Patient Name: Date of Service: Brandi Morgans A. 08/05/2021 7:30 A M Medical Record Number: [id number] Patient Account Number: [id number] Date of Birth/Sex: Treating RN: [birth year] (70 y.o. Female) Lorrin Jackson Primary Care Darron Stuck: [name] Other Clinician: Referring Oreoluwa Aigner: Treating Caralina Nop/Extender: Sharalyn Ink in Treatment: 0 Active Inactive Venous Leg Ulcer Nursing Diagnoses: Actual venous Insuffiency (use after diagnosis is confirmed) Goals: Patient will maintain optimal edema control Date Initiated: 08/05/2021 Target Resolution Date: 09/02/2021 Goal Status: Active Interventions: Assess peripheral edema status every visit. Compression as ordered Provide education on venous  insufficiency Treatment Activities: Therapeutic compression applied : 08/05/2021 Notes: Wound/Skin Impairment Nursing Diagnoses: Impaired tissue integrity Goals: Patient/caregiver will verbalize understanding of skin care regimen Date Initiated: 08/05/2021 Target Resolution Date: 09/02/2021 Goal Status: Active Ulcer/skin breakdown will have a volume reduction of 30% by week 4 Date Initiated: 08/05/2021 Target Resolution Date: 09/02/2021 Goal Status: Active Interventions: Assess patient/caregiver ability to obtain necessary supplies Assess patient/caregiver ability to perform ulcer/skin care regimen upon admission and as needed Assess ulceration(s) every visit Provide education on ulcer and skin care Treatment Activities: Topical wound management initiated : 08/05/2021 Notes: Electronic Signature(s) Signed: 08/05/2021 5:39:20 PM By: Lorrin Jackson Entered By: Lorrin Jackson on 08/05/2021 08:38:40 -------------------------------------------------------------------------------- Pain Assessment Details Patient Name: Date of Service: Brandi Morgans A. 08/05/2021 7:30 A M Medical Record Number: [id number] Patient Account Number: [id number] Date of Birth/Sex: Treating RN: [birth year] (70 y.o. Female) Lorrin Jackson Primary Care Loyce Flaming: [name] Other Clinician: Referring Guy Seese: Treating Aryaa Bunting/Extender: Sharalyn Ink in Treatment: 0 Active Problems Location of Pain Severity and Description of Pain Patient Has Paino Yes Site Locations Pain Location: Pain in Ulcers With Dressing Change: Yes Duration of the Pain. Constant / Intermittento Intermittent Rate the pain. Current Pain Level: 4 Character of Pain Describe the Pain: Burning, Tender Pain Management and Medication Current Pain Management: Medication: Yes Cold Application: No Rest: Yes Massage: No Activity: No T.E.N.S.: No Heat Application: No Leg drop or elevation: No Is the Current Pain  Management Adequate: Adequate How does your wound impact your activities of daily livingo Sleep: No Bathing: No Appetite: No Relationship With Others: No Bladder Continence: No Emotions: No Bowel Continence: No Work: No Toileting: No Drive: No Dressing: No Hobbies: No Electronic Signature(s) Signed: 08/05/2021 5:39:20 PM By: Lorrin Jackson Entered By: Lorrin Jackson on 08/05/2021 08:37:35 -------------------------------------------------------------------------------- Patient/Caregiver Education Details Patient Name: Date of Service: Brandi Darrin Luis A. 11/9/2022andnbsp7:30 A M Medical Record Number: [id number] Patient Account Number: [id number] Date of Birth/Gender: Treating RN: [birth year] (70 y.o. Female) Lorrin Jackson Primary Care Physician: [name] Other Clinician: Referring Physician: Treating Physician/Extender: Sharalyn Ink in Treatment: 0 Education Assessment Education Provided To: Patient Education Topics Provided Venous: Methods: Demonstration, Explain/Verbal, Printed Responses: State content correctly Wound/Skin Impairment: Methods: Demonstration, Explain/Verbal, Printed Responses: State content correctly Electronic Signature(s) Signed: 08/05/2021 5:39:20 PM By: Lorrin Jackson Entered By: Lorrin Jackson on 08/05/2021 08:39:05 -------------------------------------------------------------------------------- Wound Assessment Details Patient Name: Date of Service: Brandi Morgans A. 08/05/2021 7:30 A M Medical Record Number: [id number] Patient Account Number: [id number] Date of Birth/Sex: Treating RN: Dec 29, 1950 (70 y.o. Female) Lorrin Jackson Primary Care Laynee Lockamy: [name] Other Clinician: Referring Autumnrose Yore: Treating Aunisty Reali/Extender: Sharalyn Ink in Treatment: 0 Wound Status Wound Number: 1 Primary Lymphedema  Assessment Pulses: Dorsalis Pedis Palpable: [Left:No Yes] [Right:No Yes] Notes Bilat ABI's Non Compressible Electronic Signature(s) Signed: 08/05/2021 5:39:20 PM By: Lorrin Jackson Entered By: Lorrin Jackson on 08/05/2021 08:36:48 -------------------------------------------------------------------------------- Multi-Disciplinary Care Plan Details Patient Name: Date of Service: Brandi Morgans A. 08/05/2021 7:30 A M Medical Record Number: [id number] Patient Account Number: [id number] Date of Birth/Sex: Treating RN: [birth year] (70 y.o. Female) Lorrin Jackson Primary Care Darron Stuck: [name] Other Clinician: Referring Oreoluwa Aigner: Treating Caralina Nop/Extender: Sharalyn Ink in Treatment: 0 Active Inactive Venous Leg Ulcer Nursing Diagnoses: Actual venous Insuffiency (use after diagnosis is confirmed) Goals: Patient will maintain optimal edema control Date Initiated: 08/05/2021 Target Resolution Date: 09/02/2021 Goal Status: Active Interventions: Assess peripheral edema status every visit. Compression as ordered Provide education on venous  insufficiency Treatment Activities: Therapeutic compression applied : 08/05/2021 Notes: Wound/Skin Impairment Nursing Diagnoses: Impaired tissue integrity Goals: Patient/caregiver will verbalize understanding of skin care regimen Date Initiated: 08/05/2021 Target Resolution Date: 09/02/2021 Goal Status: Active Ulcer/skin breakdown will have a volume reduction of 30% by week 4 Date Initiated: 08/05/2021 Target Resolution Date: 09/02/2021 Goal Status: Active Interventions: Assess patient/caregiver ability to obtain necessary supplies Assess patient/caregiver ability to perform ulcer/skin care regimen upon admission and as needed Assess ulceration(s) every visit Provide education on ulcer and skin care Treatment Activities: Topical wound management initiated : 08/05/2021 Notes: Electronic Signature(s) Signed: 08/05/2021 5:39:20 PM By: Lorrin Jackson Entered By: Lorrin Jackson on 08/05/2021 08:38:40 -------------------------------------------------------------------------------- Pain Assessment Details Patient Name: Date of Service: Brandi Morgans A. 08/05/2021 7:30 A M Medical Record Number: [id number] Patient Account Number: [id number] Date of Birth/Sex: Treating RN: [birth year] (70 y.o. Female) Lorrin Jackson Primary Care Loyce Flaming: [name] Other Clinician: Referring Guy Seese: Treating Aryaa Bunting/Extender: Sharalyn Ink in Treatment: 0 Active Problems Location of Pain Severity and Description of Pain Patient Has Paino Yes Site Locations Pain Location: Pain in Ulcers With Dressing Change: Yes Duration of the Pain. Constant / Intermittento Intermittent Rate the pain. Current Pain Level: 4 Character of Pain Describe the Pain: Burning, Tender Pain Management and Medication Current Pain Management: Medication: Yes Cold Application: No Rest: Yes Massage: No Activity: No T.E.N.S.: No Heat Application: No Leg drop or elevation: No Is the Current Pain  Management Adequate: Adequate How does your wound impact your activities of daily livingo Sleep: No Bathing: No Appetite: No Relationship With Others: No Bladder Continence: No Emotions: No Bowel Continence: No Work: No Toileting: No Drive: No Dressing: No Hobbies: No Electronic Signature(s) Signed: 08/05/2021 5:39:20 PM By: Lorrin Jackson Entered By: Lorrin Jackson on 08/05/2021 08:37:35 -------------------------------------------------------------------------------- Patient/Caregiver Education Details Patient Name: Date of Service: Brandi Darrin Luis A. 11/9/2022andnbsp7:30 A M Medical Record Number: [id number] Patient Account Number: [id number] Date of Birth/Gender: Treating RN: [birth year] (70 y.o. Female) Lorrin Jackson Primary Care Physician: [name] Other Clinician: Referring Physician: Treating Physician/Extender: Sharalyn Ink in Treatment: 0 Education Assessment Education Provided To: Patient Education Topics Provided Venous: Methods: Demonstration, Explain/Verbal, Printed Responses: State content correctly Wound/Skin Impairment: Methods: Demonstration, Explain/Verbal, Printed Responses: State content correctly Electronic Signature(s) Signed: 08/05/2021 5:39:20 PM By: Lorrin Jackson Entered By: Lorrin Jackson on 08/05/2021 08:39:05 -------------------------------------------------------------------------------- Wound Assessment Details Patient Name: Date of Service: Brandi Morgans A. 08/05/2021 7:30 A M Medical Record Number: [id number] Patient Account Number: [id number] Date of Birth/Sex: Treating RN: Dec 29, 1950 (70 y.o. Female) Lorrin Jackson Primary Care Laynee Lockamy: [name] Other Clinician: Referring Autumnrose Yore: Treating Aunisty Reali/Extender: Sharalyn Ink in Treatment: 0 Wound Status Wound Number: 1 Primary Lymphedema  Assessment Pulses: Dorsalis Pedis Palpable: [Left:No Yes] [Right:No Yes] Notes Bilat ABI's Non Compressible Electronic Signature(s) Signed: 08/05/2021 5:39:20 PM By: Lorrin Jackson Entered By: Lorrin Jackson on 08/05/2021 08:36:48 -------------------------------------------------------------------------------- Multi-Disciplinary Care Plan Details Patient Name: Date of Service: Brandi Morgans A. 08/05/2021 7:30 A M Medical Record Number: [id number] Patient Account Number: [id number] Date of Birth/Sex: Treating RN: [birth year] (70 y.o. Female) Lorrin Jackson Primary Care Darron Stuck: [name] Other Clinician: Referring Oreoluwa Aigner: Treating Caralina Nop/Extender: Sharalyn Ink in Treatment: 0 Active Inactive Venous Leg Ulcer Nursing Diagnoses: Actual venous Insuffiency (use after diagnosis is confirmed) Goals: Patient will maintain optimal edema control Date Initiated: 08/05/2021 Target Resolution Date: 09/02/2021 Goal Status: Active Interventions: Assess peripheral edema status every visit. Compression as ordered Provide education on venous  insufficiency Treatment Activities: Therapeutic compression applied : 08/05/2021 Notes: Wound/Skin Impairment Nursing Diagnoses: Impaired tissue integrity Goals: Patient/caregiver will verbalize understanding of skin care regimen Date Initiated: 08/05/2021 Target Resolution Date: 09/02/2021 Goal Status: Active Ulcer/skin breakdown will have a volume reduction of 30% by week 4 Date Initiated: 08/05/2021 Target Resolution Date: 09/02/2021 Goal Status: Active Interventions: Assess patient/caregiver ability to obtain necessary supplies Assess patient/caregiver ability to perform ulcer/skin care regimen upon admission and as needed Assess ulceration(s) every visit Provide education on ulcer and skin care Treatment Activities: Topical wound management initiated : 08/05/2021 Notes: Electronic Signature(s) Signed: 08/05/2021 5:39:20 PM By: Lorrin Jackson Entered By: Lorrin Jackson on 08/05/2021 08:38:40 -------------------------------------------------------------------------------- Pain Assessment Details Patient Name: Date of Service: Brandi Morgans A. 08/05/2021 7:30 A M Medical Record Number: [id number] Patient Account Number: [id number] Date of Birth/Sex: Treating RN: [birth year] (70 y.o. Female) Lorrin Jackson Primary Care Loyce Flaming: [name] Other Clinician: Referring Guy Seese: Treating Aryaa Bunting/Extender: Sharalyn Ink in Treatment: 0 Active Problems Location of Pain Severity and Description of Pain Patient Has Paino Yes Site Locations Pain Location: Pain in Ulcers With Dressing Change: Yes Duration of the Pain. Constant / Intermittento Intermittent Rate the pain. Current Pain Level: 4 Character of Pain Describe the Pain: Burning, Tender Pain Management and Medication Current Pain Management: Medication: Yes Cold Application: No Rest: Yes Massage: No Activity: No T.E.N.S.: No Heat Application: No Leg drop or elevation: No Is the Current Pain  Management Adequate: Adequate How does your wound impact your activities of daily livingo Sleep: No Bathing: No Appetite: No Relationship With Others: No Bladder Continence: No Emotions: No Bowel Continence: No Work: No Toileting: No Drive: No Dressing: No Hobbies: No Electronic Signature(s) Signed: 08/05/2021 5:39:20 PM By: Lorrin Jackson Entered By: Lorrin Jackson on 08/05/2021 08:37:35 -------------------------------------------------------------------------------- Patient/Caregiver Education Details Patient Name: Date of Service: Brandi Darrin Luis A. 11/9/2022andnbsp7:30 A M Medical Record Number: [id number] Patient Account Number: [id number] Date of Birth/Gender: Treating RN: [birth year] (70 y.o. Female) Lorrin Jackson Primary Care Physician: [name] Other Clinician: Referring Physician: Treating Physician/Extender: Sharalyn Ink in Treatment: 0 Education Assessment Education Provided To: Patient Education Topics Provided Venous: Methods: Demonstration, Explain/Verbal, Printed Responses: State content correctly Wound/Skin Impairment: Methods: Demonstration, Explain/Verbal, Printed Responses: State content correctly Electronic Signature(s) Signed: 08/05/2021 5:39:20 PM By: Lorrin Jackson Entered By: Lorrin Jackson on 08/05/2021 08:39:05 -------------------------------------------------------------------------------- Wound Assessment Details Patient Name: Date of Service: Brandi Morgans A. 08/05/2021 7:30 A M Medical Record Number: [id number] Patient Account Number: [id number] Date of Birth/Sex: Treating RN: Dec 29, 1950 (70 y.o. Female) Lorrin Jackson Primary Care Laynee Lockamy: [name] Other Clinician: Referring Autumnrose Yore: Treating Aunisty Reali/Extender: Sharalyn Ink in Treatment: 0 Wound Status Wound Number: 1 Primary Lymphedema

## 2021-08-05 NOTE — Progress Notes (Signed)
[name], [name] ([id number]) Visit Report for 08/05/2021 Abuse/Suicide Risk Screen Details Patient Name: Date of Service: [name], [name] 08/05/2021 7:30 A M Medical Record Number: [id number] Patient Account Number: [id number] Date of Birth/Sex: Treating RN: 1951-06-14 (70 y.o. Female) Antonieta Iba Primary Care [name]: Georgianne Fick Other Clinician: Referring Satomi Buda: Treating Earmon Sherrow/Extender: Skeet Simmer in Treatment: 0 Abuse/Suicide Risk Screen Items Answer ABUSE RISK SCREEN: Has anyone close to you tried to hurt or harm you recentlyo No Do you feel uncomfortable with anyone in your familyo No Has anyone forced you do things that you didnt want to doo No Electronic Signature(s) Signed: 08/05/2021 5:39:20 PM By: Antonieta Iba Entered By: Antonieta Iba on 08/05/2021 08:21:30 -------------------------------------------------------------------------------- Activities of Daily Living Details Patient Name: Date of Service: [name], [name] 08/05/2021 7:30 A M Medical Record Number: [id number] Patient Account Number: [id number] Date of Birth/Sex: Treating RN: 1951/02/27 (70 y.o. Female) Antonieta Iba Primary Care Eniyah Eastmond: Georgianne Fick Other Clinician: Referring Harsha Yusko: Treating Devon Pretty/Extender: Skeet Simmer in Treatment: 0 Activities of Daily Living Items Answer Activities of Daily Living (Please select one for each item) Drive Automobile Not Able T Medications ake Completely Able Use T elephone Completely Able Care for Appearance Completely Able Use T oilet Completely Able Bath / Shower Need Assistance Dress Self Completely Able Feed Self Completely Able Walk Need Assistance Get In / Out Bed Completely Able Housework Need Assistance Prepare Meals Completely Able Handle Money Completely Able Shop for Self Completely Able Electronic Signature(s) Signed: 08/05/2021 5:39:20 PM By: Antonieta Iba Entered By: Antonieta Iba on 08/05/2021 08:22:15 -------------------------------------------------------------------------------- Education Screening Details Patient Name: Date of Service: Brandi Huff A. 08/05/2021 7:30 A M Medical Record Number: [id number] Patient Account Number: [id number] Date of Birth/Sex: Treating RN: 05/24/1951 (70 y.o. Female) Antonieta Iba Primary Care Vallerie Hentz: Georgianne Fick Other Clinician: Referring Mansour Balboa: Treating Matthews Franks/Extender: Skeet Simmer in Treatment: 0 Primary Learner Assessed: Patient Learning Preferences/Education Level/Primary Language Learning Preference: Explanation, Demonstration, Printed Material Highest Education Level: High School Preferred Language: English Cognitive Barrier Language Barrier: No Translator Needed: No Memory Deficit: No Emotional Barrier: No Cultural/Religious Beliefs Affecting Medical Care: No Physical Barrier Impaired Vision: Yes Glasses Impaired Hearing: No Decreased Hand dexterity: No Knowledge/Comprehension Knowledge Level: Medium Comprehension Level: High Ability to understand written instructions: High Ability to understand verbal instructions: High Motivation Anxiety Level: Calm Cooperation: Cooperative Education Importance: Acknowledges Need Interest in Health Problems: Asks Questions Perception: Coherent Willingness to Engage in Self-Management High Activities: Readiness to Engage in Self-Management High Activities: Electronic Signature(s) Signed: 08/05/2021 5:39:20 PM By: Antonieta Iba Entered By: Antonieta Iba on 08/05/2021 08:22:54 -------------------------------------------------------------------------------- Fall Risk Assessment Details Patient Name: Date of Service: Brandi Huff A. 08/05/2021 7:30 A M Medical Record Number: [id number] Patient Account Number: [id number] Date of Birth/Sex: Treating RN: [birth year] (70 y.o. Female) Antonieta Iba Primary Care Ajia Chadderdon:  Georgianne Fick Other Clinician: Referring Recia Sons: Treating Myrl Bynum/Extender: Skeet Simmer in Treatment: 0 Fall Risk Assessment Items Have you had 2 or more falls in the last 12 monthso 0 No Have you had any fall that resulted in injury in the last 12 monthso 0 No FALLS RISK SCREEN History of falling - immediate or within 3 months 0 No Secondary diagnosis (Do you have 2 or more medical diagnoseso) 15 Yes Ambulatory aid None/bed rest/wheelchair/nurse 0 No Crutches/cane/walker 15 Yes Furniture 0 No Intravenous therapy Access/Saline/Heparin Lock 0 No Gait/Transferring Normal/ bed rest/ wheelchair 0 Yes Weak (short steps with or  without shuffle, stooped but able to lift head while walking, may seek 0 No support from furniture) Impaired (short steps with shuffle, may have difficulty arising from chair, head down, impaired 0 No balance) Mental Status Oriented to own ability 0 Yes Electronic Signature(s) Signed: 08/05/2021 5:39:20 PM By: Antonieta Iba Entered By: Antonieta Iba on 08/05/2021 08:23:13 -------------------------------------------------------------------------------- Foot Assessment Details Patient Name: Date of Service: Brandi Huff A. 08/05/2021 7:30 A M Medical Record Number: [id number] Patient Account Number: [id number] Date of Birth/Sex: Treating RN: 1951-01-31 (70 y.o. Female) Antonieta Iba Primary Care Krystall Kruckenberg: Georgianne Fick Other Clinician: Referring Clarissa Laird: Treating Jaryah Aracena/Extender: Skeet Simmer in Treatment: 0 Foot Assessment Items Site Locations + = Sensation present, - = Sensation absent, C = Callus, U = Ulcer R = Redness, W = Warmth, M = Maceration, PU = Pre-ulcerative lesion F = Fissure, S = Swelling, D = Dryness Assessment Right: Left: Other Deformity: No No Prior Foot Ulcer: No No Prior Amputation: No No Charcot Joint: No No Ambulatory Status: Ambulatory With Help Assistance Device: Walker Gait:  Steady Electronic Signature(s) Signed: 08/05/2021 5:39:20 PM By: Antonieta Iba Entered By: Antonieta Iba on 08/05/2021 08:24:50 -------------------------------------------------------------------------------- Nutrition Risk Screening Details Patient Name: Date of Service: [name], [name] 08/05/2021 7:30 A M Medical Record Number: [id number] Patient Account Number: [id number] Date of Birth/Sex: Treating RN: 06-17-1951 (70 y.o. Female) Antonieta Iba Primary Care Tag Wurtz: Georgianne Fick Other Clinician: Referring Lauris Keepers: Treating Elisandro Jarrett/Extender: Skeet Simmer in Treatment: 0 Height (in): 63 Weight (lbs): 282 Body Mass Index (BMI): 49.9 Nutrition Risk Screening Items Score Screening NUTRITION RISK SCREEN: I have an illness or condition that made me change the kind and/or amount of food I eat 0 No I eat fewer than two meals per day 0 No I eat few fruits and vegetables, or milk products 0 No I have three or more drinks of beer, liquor or wine almost every day 0 No I have tooth or mouth problems that make it hard for me to eat 0 No I don't always have enough money to buy the food I need 0 No I eat alone most of the time 0 No I take three or more different prescribed or over-the-counter drugs a day 1 Yes Without wanting to, I have lost or gained 10 pounds in the last six months 0 No I am not always physically able to shop, cook and/or feed myself 0 No Nutrition Protocols Good Risk Protocol 0 No interventions needed Moderate Risk Protocol High Risk Proctocol Risk Level: Good Risk Score: 1 Electronic Signature(s) Signed: 08/05/2021 5:39:20 PM By: Antonieta Iba Entered By: Antonieta Iba on 08/05/2021 52:84:13

## 2021-08-11 ENCOUNTER — Other Ambulatory Visit: Payer: Self-pay

## 2021-08-11 DIAGNOSIS — I739 Peripheral vascular disease, unspecified: Secondary | ICD-10-CM

## 2021-08-12 ENCOUNTER — Other Ambulatory Visit: Payer: Self-pay

## 2021-08-12 ENCOUNTER — Encounter (HOSPITAL_BASED_OUTPATIENT_CLINIC_OR_DEPARTMENT_OTHER): Payer: HMO | Admitting: Physician Assistant

## 2021-08-12 DIAGNOSIS — I890 Lymphedema, not elsewhere classified: Secondary | ICD-10-CM | POA: Diagnosis not present

## 2021-08-12 DIAGNOSIS — I10 Essential (primary) hypertension: Secondary | ICD-10-CM | POA: Diagnosis not present

## 2021-08-12 DIAGNOSIS — L97822 Non-pressure chronic ulcer of other part of left lower leg with fat layer exposed: Secondary | ICD-10-CM | POA: Diagnosis not present

## 2021-08-12 DIAGNOSIS — L97812 Non-pressure chronic ulcer of other part of right lower leg with fat layer exposed: Secondary | ICD-10-CM | POA: Diagnosis not present

## 2021-08-12 NOTE — Progress Notes (Signed)
NUHA, DEGNER ([id number]) Visit Report for 08/12/2021 Chief Complaint Document Details Patient Name: Date of Service: ANGELES, [name] 08/12/2021 8:30 A M Medical Record Number: [id number] Patient Account Number: [id number] Date of Birth/Sex: Treating RN: [birth year] (70 y.o. Elam Dutch Primary Care Provider: [name] Other Clinician: Referring Provider: Treating Provider/Extender: [name], [name] in Treatment: 1 Information Obtained from: Patient Chief Complaint Bilateral LE ULcers Electronic Signature(s) Signed: 08/12/2021 9:28:07 AM By: Worthy Keeler PA-C Entered By: Worthy Keeler on 08/12/2021 09:28:07 -------------------------------------------------------------------------------- HPI Details Patient Name: Date of Service: Brandi Morgans A. 08/12/2021 8:30 A M Medical Record Number: [id number] Patient Account Number: [id number] Date of Birth/Sex: Treating RN: [birth year] (70 y.o. Elam Dutch Primary Care Provider: [name] Other Clinician: Referring Provider: Treating Provider/Extender: Angelica Pou in Treatment: 1 History of Present Illness HPI Description: 08/05/2021 upon evaluation today patient has bilateral wounds on her lower extremities. She has been tolerating the dressing changes without complication at home but unfortunately is not really seeing signs of improvement like she would like to see. She does have what appears to be bilateral lymphedema due to chronic venous insufficiency most likely. She is on fluid pills and takes them regularly. She really does not sit with her legs elevated much she has a recliner but again does not use that often. I think she should be using this more to get her feet up. Subsequently she tells me this has been going on for several weeks to even months. She finally decided to make a referral herself to come in to be seen. Patient  does have a history of diabetes mellitus type 2, some evidence of potential peripheral vascular disease as she is noncompressible with her ABIs in the office today, hypertension, and she is on long-term anticoagulant therapy. 08/12/2021 upon evaluation today patient's wounds appear to be doing well in regard to her legs currently. Actually very pleased with where things stand and I do not see any signs of active infection which is great news. No fevers, chills, nausea, vomiting, or diarrhea. Electronic Signature(s) Signed: 08/12/2021 10:31:51 AM By: Worthy Keeler PA-C Entered By: Worthy Keeler on 08/12/2021 10:31:50 -------------------------------------------------------------------------------- Physical Exam Details Patient Name: Date of Service: [name], [name] 08/12/2021 8:30 A M Medical Record Number: [id number] Patient Account Number: [id number] Date of Birth/Sex: Treating RN: [birth year] (70 y.o. Elam Dutch Primary Care Provider: [name] Other Clinician: Referring Provider: Treating Provider/Extender: [name], [name] in Treatment: 1 Constitutional Obese and well-hydrated in no acute distress. Respiratory normal breathing without difficulty. Psychiatric this patient is able to make decisions and demonstrates good insight into disease process. Alert and Oriented x 3. pleasant and cooperative. Notes Patient's wounds currently showed signs of good granulation epithelization at this point. I do not see any evidence of active infection systemically which is great news and overall I am extremely pleased with where we stand. I do believe that we are headed in the appropriate direction as far as the patient's wounds are concerned overall. We do need to see about ordering her some Velcro compression wraps she is getting need this ongoing and I discussed that in detail with her today as well. Electronic Signature(s) Signed: 08/12/2021  10:32:25 AM By: Worthy Keeler PA-C Entered By: Worthy Keeler on 08/12/2021 10:32:25 -------------------------------------------------------------------------------- Physician Orders Details Patient Name: Date of Service: Brandi Morgans A. 08/12/2021 8:30 A M Medical  Record Number: [id number] Patient Account Number: [id number] Date of Birth/Sex: Treating RN: [birth year] (70 y.o. Elam Dutch Primary Care Provider: [name] Other Clinician: Referring Provider: Treating Provider/Extender: Angelica Pou in Treatment: 1 Verbal / Phone Orders: No Diagnosis Coding Follow-up Appointments ppointment in 1 week. - with Margarita Grizzle Return A Bathing/ Shower/ Hygiene May shower with protection but do not get wound dressing(s) wet. - Can use cast protector bag, can find it at [facility] or [facility]. Edema Control - Lymphedema / SCD / Other Elevate legs to the level of the heart or above for 30 minutes daily and/or when sitting, a frequency of: - Use your recliner, do not sit with legs hanging down. Avoid standing for long periods of time. Moisturize legs daily. Additional Orders / Instructions Follow Nutritious Diet - Monitor your blood sugar regularly Wound Treatment Wound #1 - Lower Leg Wound Laterality: Left, Anterior Cleanser: Soap and Water 1 x Per Week/15 Days Discharge Instructions: May shower and wash wound with dial antibacterial soap and water prior to dressing change. Cleanser: Wound Cleanser 1 x Per Week/15 Days Discharge Instructions: Cleanse the wound with wound cleanser prior to applying a clean dressing using gauze sponges, not tissue or cotton balls. Secondary Dressing: ABD Pad, 5x9 1 x Per Week/15 Days Discharge Instructions: Apply over primary dressing as directed. Compression Wrap: Unnaboot w/Calamine, 4x10 (in/yd) 1 x Per Week/15 Days Discharge Instructions: Apply Unnaboot as directed. Compression Stockings: Circaid Juxta Lite  Compression Wrap (DME) Left Leg Compression Amount: 30-40 mmHG Discharge Instructions: Apply Circaid Juxta Lite Compression Wrap daily as instructed. Apply first thing in the morning, remove at night before bed. Wound #2 - Lower Leg Wound Laterality: Right, Medial Cleanser: Soap and Water 1 x Per Week/15 Days Discharge Instructions: May shower and wash wound with dial antibacterial soap and water prior to dressing change. Cleanser: Wound Cleanser 1 x Per Week/15 Days Discharge Instructions: Cleanse the wound with wound cleanser prior to applying a clean dressing using gauze sponges, not tissue or cotton balls. Compression Wrap: Unnaboot w/Calamine, 4x10 (in/yd) 1 x Per Week/15 Days Discharge Instructions: Apply Unnaboot as directed. Compression Stockings: Circaid Juxta Lite Compression Wrap (DME) Right Leg Compression Amount: 30-40 mmHG Discharge Instructions: Apply Circaid Juxta Lite Compression Wrap daily as instructed. Apply first thing in the morning, remove at night before bed. Electronic Signature(s) Signed: 08/12/2021 4:59:49 PM By: Worthy Keeler PA-C Signed: 08/12/2021 5:55:37 PM By: Baruch Gouty RN, BSN Entered By: Baruch Gouty on 08/12/2021 09:25:41 -------------------------------------------------------------------------------- Problem List Details Patient Name: Date of Service: Brandi Morgans A. 08/12/2021 8:30 A M Medical Record Number: [id number] Patient Account Number: [id number] Date of Birth/Sex: Treating RN: 1950/11/16 (70 y.o. Martyn Malay, Linda Primary Care Provider: [name] Other Clinician: Referring Provider: Treating Provider/Extender: Angelica Pou in Treatment: 1 Active Problems ICD-10 Encounter Code Description Active Date MDM Diagnosis I89.0 Lymphedema, not elsewhere classified 08/05/2021 No Yes I87.2 Venous insufficiency (chronic) (peripheral) 08/05/2021 No Yes L97.822 Non-pressure chronic ulcer of  other part of left lower leg with fat layer exposed11/05/2021 No Yes L97.812 Non-pressure chronic ulcer of other part of right lower leg with fat layer 08/05/2021 No Yes exposed E11.622 Type 2 diabetes mellitus with other skin ulcer 08/05/2021 No Yes I73.89 Other specified peripheral vascular diseases 08/05/2021 No Yes I10 Essential (primary) hypertension 08/05/2021 No Yes Z79.01 Long term (current) use of anticoagulants 08/05/2021 No Yes Inactive Problems Resolved Problems Electronic Signature(s) Signed: 08/12/2021 9:27:46 AM By: Worthy Keeler  PA-C Entered By: Worthy Keeler on 08/12/2021 09:27:46 -------------------------------------------------------------------------------- Progress Note Details Patient Name: Date of Service: [name], [name] 08/12/2021 8:30 A M Medical Record Number: [id number] Patient Account Number: [id number] Date of Birth/Sex: Treating RN: May 11, 1951 (70 y.o. Elam Dutch Primary Care Provider: [name] Other Clinician: Referring Provider: Treating Provider/Extender: Angelica Pou in Treatment: 1 Subjective Chief Complaint Information obtained from Patient Bilateral LE ULcers History of Present Illness (HPI) 08/05/2021 upon evaluation today patient has bilateral wounds on her lower extremities. She has been tolerating the dressing changes without complication at home but unfortunately is not really seeing signs of improvement like she would like to see. She does have what appears to be bilateral lymphedema due to chronic venous insufficiency most likely. She is on fluid pills and takes them regularly. She really does not sit with her legs elevated much she has a recliner but again does not use that often. I think she should be using this more to get her feet up. Subsequently she tells me this has been going on for several weeks to even months. She finally decided to make a referral herself to come in to be  seen. Patient does have a history of diabetes mellitus type 2, some evidence of potential peripheral vascular disease as she is noncompressible with her ABIs in the office today, hypertension, and she is on long-term anticoagulant therapy. 08/12/2021 upon evaluation today patient's wounds appear to be doing well in regard to her legs currently. Actually very pleased with where things stand and I do not see any signs of active infection which is great news. No fevers, chills, nausea, vomiting, or diarrhea. Objective Constitutional Obese and well-hydrated in no acute distress. Vitals Time Taken: 8:37 AM, Height: 63 in, Source: Stated, Weight: 282 lbs, Source: Stated, BMI: 49.9, Temperature: 98.0 F, Pulse: 93 bpm, Respiratory Rate: 20 breaths/min, Blood Pressure: 168/85 mmHg, Capillary Blood Glucose: 181 mg/dl. General Notes: glucose per pt report this am Respiratory normal breathing without difficulty. Psychiatric this patient is able to make decisions and demonstrates good insight into disease process. Alert and Oriented x 3. pleasant and cooperative. General Notes: Patient's wounds currently showed signs of good granulation epithelization at this point. I do not see any evidence of active infection systemically which is great news and overall I am extremely pleased with where we stand. I do believe that we are headed in the appropriate direction as far as the patient's wounds are concerned overall. We do need to see about ordering her some Velcro compression wraps she is getting need this ongoing and I discussed that in detail with her today as well. Integumentary (Hair, Skin) Wound #1 status is Open. Original cause of wound was Blister. The date acquired was: 06/23/2021. The wound has been in treatment 1 weeks. The wound is located on the Left,Anterior Lower Leg. The wound measures 0.1cm length x 0.1cm width x 0.1cm depth; 0.008cm^2 area and 0.001cm^3 volume. There is Fat Layer (Subcutaneous  Tissue) exposed. There is no tunneling or undermining noted. There is a small amount of serous drainage noted. The wound margin is distinct with the outline attached to the wound base. There is small (1-33%) red granulation within the wound bed. There is no necrotic tissue within the wound bed. Wound #2 status is Open. Original cause of wound was Blister. The date acquired was: 06/23/2021. The wound has been in treatment 1 weeks. The wound is located on the Right,Medial Lower Leg. The wound  measures 0.1cm length x 0.1cm width x 0.1cm depth; 0.008cm^2 area and 0.001cm^3 volume. There is Fat Layer (Subcutaneous Tissue) exposed. There is no tunneling or undermining noted. There is a small amount of serous drainage noted. The wound margin is distinct with the outline attached to the wound base. There is small (1-33%) red granulation within the wound bed. There is no necrotic tissue within the wound bed. Assessment Active Problems ICD-10 Lymphedema, not elsewhere classified Venous insufficiency (chronic) (peripheral) Non-pressure chronic ulcer of other part of left lower leg with fat layer exposed Non-pressure chronic ulcer of other part of right lower leg with fat layer exposed Type 2 diabetes mellitus with other skin ulcer Other specified peripheral vascular diseases Essential (primary) hypertension Long term (current) use of anticoagulants Procedures Wound #1 Pre-procedure diagnosis of Wound #1 is a Lymphedema located on the Left,Anterior Lower Leg . There was a Haematologist Compression Therapy Procedure by Baruch Gouty, RN. Post procedure Diagnosis Wound #1: Same as Pre-Procedure Wound #2 Pre-procedure diagnosis of Wound #2 is a Lymphedema located on the Right,Medial Lower Leg . There was a Haematologist Compression Therapy Procedure by Baruch Gouty, RN. Post procedure Diagnosis Wound #2: Same as Pre-Procedure Plan Follow-up Appointments: Return Appointment in 1 week. - with Glynn Octave  Shower/ Hygiene: May shower with protection but do not get wound dressing(s) wet. - Can use cast protector bag, can find it at [facility] or [facility]. Edema Control - Lymphedema / SCD / Other: Elevate legs to the level of the heart or above for 30 minutes daily and/or when sitting, a frequency of: - Use your recliner, do not sit with legs hanging down. Avoid standing for long periods of time. Moisturize legs daily. Additional Orders / Instructions: Follow Nutritious Diet - Monitor your blood sugar regularly WOUND #1: - Lower Leg Wound Laterality: Left, Anterior Cleanser: Soap and Water 1 x Per Week/15 Days Discharge Instructions: May shower and wash wound with dial antibacterial soap and water prior to dressing change. Cleanser: Wound Cleanser 1 x Per Week/15 Days Discharge Instructions: Cleanse the wound with wound cleanser prior to applying a clean dressing using gauze sponges, not tissue or cotton balls. Secondary Dressing: ABD Pad, 5x9 1 x Per Week/15 Days Discharge Instructions: Apply over primary dressing as directed. Com pression Wrap: Unnaboot w/Calamine, 4x10 (in/yd) 1 x Per Week/15 Days Discharge Instructions: Apply Unnaboot as directed. Com pression Stockings: Circaid Juxta Lite Compression Wrap (DME) Compression Amount: 30-40 mmHg (left) Discharge Instructions: Apply Circaid Juxta Lite Compression Wrap daily as instructed. Apply first thing in the morning, remove at night before bed. WOUND #2: - Lower Leg Wound Laterality: Right, Medial Cleanser: Soap and Water 1 x Per Week/15 Days Discharge Instructions: May shower and wash wound with dial antibacterial soap and water prior to dressing change. Cleanser: Wound Cleanser 1 x Per Week/15 Days Discharge Instructions: Cleanse the wound with wound cleanser prior to applying a clean dressing using gauze sponges, not tissue or cotton balls. Com pression Wrap: Unnaboot w/Calamine, 4x10 (in/yd) 1 x Per Week/15 Days Discharge Instructions:  Apply Unnaboot as directed. Com pression Stockings: Circaid Juxta Lite Compression Wrap (DME) Compression Amount: 30-40 mmHg (right) Discharge Instructions: Apply Circaid Juxta Lite Compression Wrap daily as instructed. Apply first thing in the morning, remove at night before bed. 1. Would recommend currently that we going to continue with the wound care measures as before and the patient is in agreement with the plan this includes the use of the Unna boot wraps although I  do not think we need any dressings as things seem to be doing quite well. 2. I am going to recommend we have the patient continue to monitor for any signs of worsening or infection. Obviously if anything changes we need to know as soon as possible. Otherwise my plan is good to be for [place] to probably discharge her next week with the Velcro compression wraps. We will see patient back for reevaluation in 1 week here in the clinic. If anything worsens or changes patient will contact our office for additional recommendations. Electronic Signature(s) Signed: 08/12/2021 10:33:07 AM By: Worthy Keeler PA-C Entered By: Worthy Keeler on 08/12/2021 10:33:07 -------------------------------------------------------------------------------- SuperBill Details Patient Name: Date of Service: Brandi Morgans A. 08/12/2021 Medical Record Number: [id number] Patient Account Number: [id number] Date of Birth/Sex: Treating RN: August 28, 1951 (70 y.o. Martyn Malay, Linda Primary Care Provider: [name] Other Clinician: Referring Provider: Treating Provider/Extender: [name], [name] in Treatment: 1 Diagnosis Coding ICD-10 Codes Code Description I89.0 Lymphedema, not elsewhere classified I87.2 Venous insufficiency (chronic) (peripheral) L97.822 Non-pressure chronic ulcer of other part of left lower leg with fat layer exposed L97.812 Non-pressure chronic ulcer of other part of right lower leg with fat layer  exposed E11.622 Type 2 diabetes mellitus with other skin ulcer I73.89 Other specified peripheral vascular diseases I10 Essential (primary) hypertension Z79.01 Long term (current) use of anticoagulants Facility Procedures CPT4 Code: [id number] Description: 29580 - APPLY UNNA BOOT/PROFO BILATERAL Modifier: Quantity: 1 Physician Procedures : CPT4 Code Description Modifier [id number] - WC PHYS LEVEL 4 - EST PT ICD-10 Diagnosis Description I89.0 Lymphedema, not elsewhere classified I87.2 Venous insufficiency (chronic) (peripheral) L97.822 Non-pressure chronic ulcer of other part of left  lower leg with fat layer exposed L97.812 Non-pressure chronic ulcer of other part of right lower leg with fat layer exposed Quantity: 1 Electronic Signature(s) Signed: 08/12/2021 10:33:27 AM By: Worthy Keeler PA-C Entered By: Worthy Keeler on 08/12/2021 10:33:25

## 2021-08-12 NOTE — Progress Notes (Signed)
[name], [name] ([id number]) Visit Report for 08/12/2021 Arrival Information Details Patient Name: Date of Service: [name], [name] 08/12/2021 8:30 A M Medical Record Number: [id number] Patient Account Number: [id number] Date of Birth/Sex: Treating RN: May 07, 1951 (70 y.o. [name] Primary Care Sherril Shipman: [name] Other Clinician: Referring Kada Friesen: Treating Cheridan Kibler/Extender: Angelica Pou in Treatment: 1 Visit Information History Since Last Visit Added or deleted any medications: No Patient Arrived: Wheel Chair Any new allergies or adverse reactions: No Arrival Time: 08:31 Had a fall or experienced change in No Accompanied By: son activities of daily living that may affect Transfer Assistance: Manual risk of falls: Patient Identification Verified: Yes Signs or symptoms of abuse/neglect since last visito No Secondary Verification Process Completed: Yes Hospitalized since last visit: No Patient Requires Transmission-Based Precautions: No Implantable device outside of the clinic excluding No Patient Has Alerts: Yes cellular tissue based products placed in the center Patient Alerts: Patient on Blood Thinner since last visit: ABI's Bilat =March ARB Has Dressing in Place as Prescribed: Yes Has Compression in Place as Prescribed: Yes Pain Present Now: Yes Electronic Signature(s) Signed: 08/12/2021 5:55:37 PM By: Baruch Gouty RN, BSN Entered By: Baruch Gouty on 08/12/2021 08:37:33 -------------------------------------------------------------------------------- Compression Therapy Details Patient Name: Date of Service: Brandi Morgans A. 08/12/2021 8:30 A M Medical Record Number: [id number] Patient Account Number: [id number] Date of Birth/Sex: Treating RN: 1951/04/23 (70 y.o. [name] Primary Care Laquetta Racey: [name] Other Clinician: Referring Katia Hannen: Treating Dawnyel Leven/Extender: [name], [name] in Treatment: 1 Compression Therapy Performed for Wound Assessment: Wound #1 Left,Anterior Lower Leg Performed By: Clinician Baruch Gouty, RN Compression Type: Rolena Infante Post Procedure Diagnosis Same as Pre-procedure Electronic Signature(s) Signed: 08/12/2021 5:55:37 PM By: Baruch Gouty RN, BSN Entered By: Baruch Gouty on 08/12/2021 09:21:55 -------------------------------------------------------------------------------- Compression Therapy Details Patient Name: Date of Service: Brandi Morgans A. 08/12/2021 8:30 A M Medical Record Number: [id number] Patient Account Number: [id number] Date of Birth/Sex: Treating RN: 1951/07/28 (70 y.o. [name] Primary Care Lakia Gritton: [name] Other Clinician: Referring Babatunde Seago: Treating Jemari Hallum/Extender: [name], [name] in Treatment: 1 Compression Therapy Performed for Wound Assessment: Wound #2 Right,Medial Lower Leg Performed By: Clinician Baruch Gouty, RN Compression Type: Rolena Infante Post Procedure Diagnosis Same as Pre-procedure Electronic Signature(s) Signed: 08/12/2021 5:55:37 PM By: Baruch Gouty RN, BSN Entered By: Baruch Gouty on 08/12/2021 09:21:55 -------------------------------------------------------------------------------- Encounter Discharge Information Details Patient Name: Date of Service: Brandi Morgans A. 08/12/2021 8:30 A M Medical Record Number: [id number] Patient Account Number: [id number] Date of Birth/Sex: Treating RN: 10-20-1950 (70 y.o. [name] Primary Care Latese Dufault: [name] Other Clinician: Referring Brooklynne Pereida: Treating Shaunte Weissinger/Extender: Angelica Pou in Treatment: 1 Encounter Discharge Information Items Discharge Condition: Stable Ambulatory Status: Wheelchair Discharge Destination: Home Transportation: Private Auto Accompanied By: son Schedule  Follow-up Appointment: Yes Clinical Summary of Care: Patient Declined Electronic Signature(s) Signed: 08/12/2021 5:55:37 PM By: Baruch Gouty RN, BSN Entered By: Baruch Gouty on 08/12/2021 09:51:58 -------------------------------------------------------------------------------- Lower Extremity Assessment Details Patient Name: Date of Service: Brandi Morgans A. 08/12/2021 8:30 A M Medical Record Number: [id number] Patient Account Number: [id number] Date of Birth/Sex: Treating RN: 11-25-1950 (70 y.o. [name] Primary Care Suriya Kovarik: [name] Other Clinician: Referring Kaylee Trivett: Treating Mayeli Bornhorst/Extender: [name], [name] in Treatment: 1 Edema Assessment Assessed: [Left: No] [Right: No] Edema: [Left: Yes] [Right: Yes] Calf Left: Right: Point of Measurement: 29 cm From  Medial Instep 46 cm 48.5 cm Ankle Left: Right: Point of Measurement: 8 cm From Medial Instep 28.5 cm 27 cm Knee To Floor Left: Right: From Medial Instep 36 cm 36 cm Vascular Assessment Pulses: Dorsalis Pedis Palpable: [Left:No] [Right:Yes] Electronic Signature(s) Signed: 08/12/2021 5:55:37 PM By: Baruch Gouty RN, BSN Entered By: Baruch Gouty on 08/12/2021 09:12:40 -------------------------------------------------------------------------------- Multi-Disciplinary Care Plan Details Patient Name: Date of Service: Brandi Morgans A. 08/12/2021 8:30 A M Medical Record Number: [id number] Patient Account Number: [id number] Date of Birth/Sex: Treating RN: May 15, 1951 (70 y.o. [name] Primary Care Starling Jessie: [name] Other Clinician: Referring Jacci Ruberg: Treating Nahjae Hoeg/Extender: Angelica Pou in Treatment: 1 Active Inactive Venous Leg Ulcer Nursing Diagnoses: Actual venous Insuffiency (use after diagnosis is confirmed) Goals: Patient will maintain optimal edema control Date Initiated:  08/05/2021 Target Resolution Date: 09/02/2021 Goal Status: Active Interventions: Assess peripheral edema status every visit. Compression as ordered Provide education on venous insufficiency Treatment Activities: Therapeutic compression applied : 08/05/2021 Notes: Wound/Skin Impairment Nursing Diagnoses: Impaired tissue integrity Goals: Patient/caregiver will verbalize understanding of skin care regimen Date Initiated: 08/05/2021 Target Resolution Date: 09/02/2021 Goal Status: Active Ulcer/skin breakdown will have a volume reduction of 30% by week 4 Date Initiated: 08/05/2021 Target Resolution Date: 09/02/2021 Goal Status: Active Interventions: Assess patient/caregiver ability to obtain necessary supplies Assess patient/caregiver ability to perform ulcer/skin care regimen upon admission and as needed Assess ulceration(s) every visit Provide education on ulcer and skin care Treatment Activities: Topical wound management initiated : 08/05/2021 Notes: Electronic Signature(s) Signed: 08/12/2021 5:55:37 PM By: Baruch Gouty RN, BSN Entered By: Baruch Gouty on 08/12/2021 09:03:26 -------------------------------------------------------------------------------- Pain Assessment Details Patient Name: Date of Service: Brandi Morgans A. 08/12/2021 8:30 A M Medical Record Number: [id number] Patient Account Number: [id number] Date of Birth/Sex: Treating RN: August 29, 1951 (70 y.o. [name] Primary Care Taylia Berber: [name] Other Clinician: Referring Tyrann Donaho: Treating Harris Penton/Extender: [name], [name] in Treatment: 1 Active Problems Location of Pain Severity and Description of Pain Patient Has Paino Yes Site Locations Pain Location: Generalized Pain With Dressing Change: No Duration of the Pain. Constant / Intermittento Intermittent Rate the pain. Current Pain Level: 5 Character of Pain Describe the Pain: Aching Pain Management and  Medication Current Pain Management: Notes reports chronic pain in bilateral knees Electronic Signature(s) Signed: 08/12/2021 5:55:37 PM By: Baruch Gouty RN, BSN Entered By: Baruch Gouty on 08/12/2021 08:38:57 -------------------------------------------------------------------------------- Patient/Caregiver Education Details Patient Name: Date of Service: Brandi Morgans A. 11/16/2022andnbsp8:30 A M Medical Record Number: [id number] Patient Account Number: [id number] Date of Birth/Gender: Treating RN: Dec 18, 1950 (70 y.o. [name] Primary Care Physician: [name] Other Clinician: Referring Physician: Treating Physician/Extender: Angelica Pou in Treatment: 1 Education Assessment Education Provided To: Patient Education Topics Provided Venous: Methods: Explain/Verbal Responses: Reinforcements needed, State content correctly Wound/Skin Impairment: Methods: Explain/Verbal Responses: Reinforcements needed, State content correctly Electronic Signature(s) Signed: 08/12/2021 5:55:37 PM By: Baruch Gouty RN, BSN Entered By: Baruch Gouty on 08/12/2021 09:03:48 -------------------------------------------------------------------------------- Wound Assessment Details Patient Name: Date of Service: Brandi Morgans A. 08/12/2021 8:30 A M Medical Record Number: [id number] Patient Account Number: [id number] Date of Birth/Sex: Treating RN: 1950-10-27 (70 y.o. [name] Primary Care Timoth Schara: [name] Other Clinician: Referring Sanora Cunanan: Treating Maymie Brunke/Extender: [name], [name] in Treatment: 1 Wound Status Wound Number: 1 Primary Lymphedema Etiology: Wound Location: Left, Anterior Lower Leg Wound Open Wounding Event: Blister Status: Date Acquired: 06/23/2021 Comorbid Cataracts,  [name], [name] ([id number]) Visit Report for 08/12/2021 Arrival Information Details Patient Name: Date of Service: [name], [name] 08/12/2021 8:30 A M Medical Record Number: [id number] Patient Account Number: [id number] Date of Birth/Sex: Treating RN: May 07, 1951 (70 y.o. [name] Primary Care Sherril Shipman: [name] Other Clinician: Referring Kada Friesen: Treating Cheridan Kibler/Extender: Angelica Pou in Treatment: 1 Visit Information History Since Last Visit Added or deleted any medications: No Patient Arrived: Wheel Chair Any new allergies or adverse reactions: No Arrival Time: 08:31 Had a fall or experienced change in No Accompanied By: son activities of daily living that may affect Transfer Assistance: Manual risk of falls: Patient Identification Verified: Yes Signs or symptoms of abuse/neglect since last visito No Secondary Verification Process Completed: Yes Hospitalized since last visit: No Patient Requires Transmission-Based Precautions: No Implantable device outside of the clinic excluding No Patient Has Alerts: Yes cellular tissue based products placed in the center Patient Alerts: Patient on Blood Thinner since last visit: ABI's Bilat =March ARB Has Dressing in Place as Prescribed: Yes Has Compression in Place as Prescribed: Yes Pain Present Now: Yes Electronic Signature(s) Signed: 08/12/2021 5:55:37 PM By: Baruch Gouty RN, BSN Entered By: Baruch Gouty on 08/12/2021 08:37:33 -------------------------------------------------------------------------------- Compression Therapy Details Patient Name: Date of Service: Brandi Morgans A. 08/12/2021 8:30 A M Medical Record Number: [id number] Patient Account Number: [id number] Date of Birth/Sex: Treating RN: 1951/04/23 (70 y.o. [name] Primary Care Laquetta Racey: [name] Other Clinician: Referring Katia Hannen: Treating Dawnyel Leven/Extender: [name], [name] in Treatment: 1 Compression Therapy Performed for Wound Assessment: Wound #1 Left,Anterior Lower Leg Performed By: Clinician Baruch Gouty, RN Compression Type: Rolena Infante Post Procedure Diagnosis Same as Pre-procedure Electronic Signature(s) Signed: 08/12/2021 5:55:37 PM By: Baruch Gouty RN, BSN Entered By: Baruch Gouty on 08/12/2021 09:21:55 -------------------------------------------------------------------------------- Compression Therapy Details Patient Name: Date of Service: Brandi Morgans A. 08/12/2021 8:30 A M Medical Record Number: [id number] Patient Account Number: [id number] Date of Birth/Sex: Treating RN: 1951/07/28 (70 y.o. [name] Primary Care Lakia Gritton: [name] Other Clinician: Referring Babatunde Seago: Treating Jemari Hallum/Extender: [name], [name] in Treatment: 1 Compression Therapy Performed for Wound Assessment: Wound #2 Right,Medial Lower Leg Performed By: Clinician Baruch Gouty, RN Compression Type: Rolena Infante Post Procedure Diagnosis Same as Pre-procedure Electronic Signature(s) Signed: 08/12/2021 5:55:37 PM By: Baruch Gouty RN, BSN Entered By: Baruch Gouty on 08/12/2021 09:21:55 -------------------------------------------------------------------------------- Encounter Discharge Information Details Patient Name: Date of Service: Brandi Morgans A. 08/12/2021 8:30 A M Medical Record Number: [id number] Patient Account Number: [id number] Date of Birth/Sex: Treating RN: 10-20-1950 (70 y.o. [name] Primary Care Latese Dufault: [name] Other Clinician: Referring Brooklynne Pereida: Treating Shaunte Weissinger/Extender: Angelica Pou in Treatment: 1 Encounter Discharge Information Items Discharge Condition: Stable Ambulatory Status: Wheelchair Discharge Destination: Home Transportation: Private Auto Accompanied By: son Schedule  Follow-up Appointment: Yes Clinical Summary of Care: Patient Declined Electronic Signature(s) Signed: 08/12/2021 5:55:37 PM By: Baruch Gouty RN, BSN Entered By: Baruch Gouty on 08/12/2021 09:51:58 -------------------------------------------------------------------------------- Lower Extremity Assessment Details Patient Name: Date of Service: Brandi Morgans A. 08/12/2021 8:30 A M Medical Record Number: [id number] Patient Account Number: [id number] Date of Birth/Sex: Treating RN: 11-25-1950 (70 y.o. [name] Primary Care Suriya Kovarik: [name] Other Clinician: Referring Kaylee Trivett: Treating Mayeli Bornhorst/Extender: [name], [name] in Treatment: 1 Edema Assessment Assessed: [Left: No] [Right: No] Edema: [Left: Yes] [Right: Yes] Calf Left: Right: Point of Measurement: 29 cm From  Medial Instep 46 cm 48.5 cm Ankle Left: Right: Point of Measurement: 8 cm From Medial Instep 28.5 cm 27 cm Knee To Floor Left: Right: From Medial Instep 36 cm 36 cm Vascular Assessment Pulses: Dorsalis Pedis Palpable: [Left:No] [Right:Yes] Electronic Signature(s) Signed: 08/12/2021 5:55:37 PM By: Baruch Gouty RN, BSN Entered By: Baruch Gouty on 08/12/2021 09:12:40 -------------------------------------------------------------------------------- Multi-Disciplinary Care Plan Details Patient Name: Date of Service: Brandi Morgans A. 08/12/2021 8:30 A M Medical Record Number: [id number] Patient Account Number: [id number] Date of Birth/Sex: Treating RN: May 15, 1951 (70 y.o. [name] Primary Care Starling Jessie: [name] Other Clinician: Referring Jacci Ruberg: Treating Nahjae Hoeg/Extender: Angelica Pou in Treatment: 1 Active Inactive Venous Leg Ulcer Nursing Diagnoses: Actual venous Insuffiency (use after diagnosis is confirmed) Goals: Patient will maintain optimal edema control Date Initiated:  08/05/2021 Target Resolution Date: 09/02/2021 Goal Status: Active Interventions: Assess peripheral edema status every visit. Compression as ordered Provide education on venous insufficiency Treatment Activities: Therapeutic compression applied : 08/05/2021 Notes: Wound/Skin Impairment Nursing Diagnoses: Impaired tissue integrity Goals: Patient/caregiver will verbalize understanding of skin care regimen Date Initiated: 08/05/2021 Target Resolution Date: 09/02/2021 Goal Status: Active Ulcer/skin breakdown will have a volume reduction of 30% by week 4 Date Initiated: 08/05/2021 Target Resolution Date: 09/02/2021 Goal Status: Active Interventions: Assess patient/caregiver ability to obtain necessary supplies Assess patient/caregiver ability to perform ulcer/skin care regimen upon admission and as needed Assess ulceration(s) every visit Provide education on ulcer and skin care Treatment Activities: Topical wound management initiated : 08/05/2021 Notes: Electronic Signature(s) Signed: 08/12/2021 5:55:37 PM By: Baruch Gouty RN, BSN Entered By: Baruch Gouty on 08/12/2021 09:03:26 -------------------------------------------------------------------------------- Pain Assessment Details Patient Name: Date of Service: Brandi Morgans A. 08/12/2021 8:30 A M Medical Record Number: [id number] Patient Account Number: [id number] Date of Birth/Sex: Treating RN: August 29, 1951 (70 y.o. [name] Primary Care Taylia Berber: [name] Other Clinician: Referring Tyrann Donaho: Treating Harris Penton/Extender: [name], [name] in Treatment: 1 Active Problems Location of Pain Severity and Description of Pain Patient Has Paino Yes Site Locations Pain Location: Generalized Pain With Dressing Change: No Duration of the Pain. Constant / Intermittento Intermittent Rate the pain. Current Pain Level: 5 Character of Pain Describe the Pain: Aching Pain Management and  Medication Current Pain Management: Notes reports chronic pain in bilateral knees Electronic Signature(s) Signed: 08/12/2021 5:55:37 PM By: Baruch Gouty RN, BSN Entered By: Baruch Gouty on 08/12/2021 08:38:57 -------------------------------------------------------------------------------- Patient/Caregiver Education Details Patient Name: Date of Service: Brandi Morgans A. 11/16/2022andnbsp8:30 A M Medical Record Number: [id number] Patient Account Number: [id number] Date of Birth/Gender: Treating RN: Dec 18, 1950 (70 y.o. [name] Primary Care Physician: [name] Other Clinician: Referring Physician: Treating Physician/Extender: Angelica Pou in Treatment: 1 Education Assessment Education Provided To: Patient Education Topics Provided Venous: Methods: Explain/Verbal Responses: Reinforcements needed, State content correctly Wound/Skin Impairment: Methods: Explain/Verbal Responses: Reinforcements needed, State content correctly Electronic Signature(s) Signed: 08/12/2021 5:55:37 PM By: Baruch Gouty RN, BSN Entered By: Baruch Gouty on 08/12/2021 09:03:48 -------------------------------------------------------------------------------- Wound Assessment Details Patient Name: Date of Service: Brandi Morgans A. 08/12/2021 8:30 A M Medical Record Number: [id number] Patient Account Number: [id number] Date of Birth/Sex: Treating RN: 1950-10-27 (70 y.o. [name] Primary Care Timoth Schara: [name] Other Clinician: Referring Sanora Cunanan: Treating Maymie Brunke/Extender: [name], [name] in Treatment: 1 Wound Status Wound Number: 1 Primary Lymphedema Etiology: Wound Location: Left, Anterior Lower Leg Wound Open Wounding Event: Blister Status: Date Acquired: 06/23/2021 Comorbid Cataracts,

## 2021-08-13 NOTE — Progress Notes (Signed)
or changes patient will contact our office for additional recommendations. Electronic Signature(s) Signed: 08/05/2021 4:46:14 PM By: Worthy Keeler PA-C Entered By: Worthy Keeler on 08/05/2021 16:46:14 -------------------------------------------------------------------------------- HxROS Details Patient Name: Date of Service: Brandi Morgans A. 08/05/2021 7:30 A M Medical Record Number: [id number] Patient Account Number: [id number] Date of Birth/Sex: Treating RN: 05-Feb-1951 (70 y.o. [name] Primary Care [name]: [name] Other Clinician: Referring [name]: Treating [name]/Extender: [name], [name] in Treatment: 0 Information Obtained From Patient Eyes Complaints and Symptoms: Positive for: Glasses / Contacts Medical History: Positive for: Cataracts - Removed Ear/Nose/Mouth/Throat Complaints and Symptoms: Negative for: Chronic sinus problems or rhinitis Gastrointestinal Complaints and Symptoms: Negative for: Frequent diarrhea; Nausea;  Vomiting Genitourinary Complaints and Symptoms: Negative for: Frequent urination Integumentary (Skin) Complaints and Symptoms: Positive for: Wounds - Bilat Legs Psychiatric Complaints and Symptoms: Negative for: Claustrophobia; Suicidal Hematologic/Lymphatic Respiratory Medical History: Positive for: Sleep Apnea Past Medical History Notes: Lung Cancer-surgery 2010 Cardiovascular Medical History: Positive for: Arrhythmia; Congestive Heart Failure; Coronary Artery Disease; Hypertension; Peripheral Venous Disease Endocrine Medical History: Positive for: Type II Diabetes Time with diabetes: Since 1996 Treated with: Insulin Blood sugar tested every day: Yes Tested : Has CGM, doesn't wear Immunological Musculoskeletal Medical History: Positive for: Osteoarthritis Neurologic Medical History: Positive for: Neuropathy Oncologic Medical History: Past Medical History Notes: Lung Cancer, no radiation or chemo HBO Extended History Items Eyes: Cataracts Immunizations Pneumococcal Vaccine: Received Pneumococcal Vaccination: Yes Received Pneumococcal Vaccination On or After 60th Birthday: Yes Implantable Devices None Family and Social History Cancer: Yes - Siblings; Diabetes: Yes - Siblings; Heart Disease: Yes - Mother,Father; Hereditary Spherocytosis: No; Hypertension: Yes - Father,Mother; Kidney Disease: No; Lung Disease: No; Seizures: No; Stroke: No; Thyroid Problems: No; Tuberculosis: No; Former smoker - Quit 1986; Marital Status - Single; Alcohol Use: Never; Drug Use: No History; Caffeine Use: Moderate; Financial Concerns: No; Food, Clothing or Shelter Needs: No; Support System Lacking: No; Transportation Concerns: No Electronic Signature(s) Signed: 08/05/2021 5:39:20 PM By: Lorrin Jackson Signed: 08/13/2021 5:50:22 PM By: Worthy Keeler PA-C Entered By: Lorrin Jackson on 08/05/2021  08:21:21 -------------------------------------------------------------------------------- SuperBill Details Patient Name: Date of Service: Brandi Morgans A. 08/05/2021 Medical Record Number: [id number] Patient Account Number: [id number] Date of Birth/Sex: Treating RN: 08/04/1951 (70 y.o. [name] Primary Care [name]: [name] Other Clinician: Referring [name]: Treating [name]/Extender: [name], [name] in Treatment: 0 Diagnosis Coding ICD-10 Codes Code Description I89.0 Lymphedema, not elsewhere classified I87.2 Venous insufficiency (chronic) (peripheral) L97.822 Non-pressure chronic ulcer of other part of left lower leg with fat layer exposed L97.812 Non-pressure chronic ulcer of other part of right lower leg with fat layer exposed E11.622 Type 2 diabetes mellitus with other skin ulcer I73.89 Other specified peripheral vascular diseases I10 Essential (primary) hypertension Z79.01 Long term (current) use of anticoagulants Facility Procedures CPT4 Code: 22297989 Description: [phone] - WOUND CARE VISIT-LEV 5 EST PT Modifier: 25 Quantity: 1 CPT4 Code: 17408144 Description: 81856 - APPLY UNNA BOOT/PROFO BILATERAL ICD-10 Diagnosis Description I89.0 Lymphedema, not elsewhere classified Modifier: Quantity: 1 Physician Procedures : CPT4 Code Description Modifier [id number] - WC PHYS LEVEL 4 - NEW PT ICD-10 Diagnosis Description I89.0 Lymphedema, not elsewhere classified I87.2 Venous insufficiency (chronic) (peripheral) L97.822 Non-pressure chronic ulcer of other part of left  lower leg with fat layer exposed L97.812 Non-pressure chronic ulcer of other part of right lower leg with fat layer exposed Quantity: 1 Electronic Signature(s) Signed: 08/05/2021 4:46:42 PM By: Melburn Hake,  or changes patient will contact our office for additional recommendations. Electronic Signature(s) Signed: 08/05/2021 4:46:14 PM By: Worthy Keeler PA-C Entered By: Worthy Keeler on 08/05/2021 16:46:14 -------------------------------------------------------------------------------- HxROS Details Patient Name: Date of Service: Brandi Morgans A. 08/05/2021 7:30 A M Medical Record Number: [id number] Patient Account Number: [id number] Date of Birth/Sex: Treating RN: 05-Feb-1951 (70 y.o. [name] Primary Care [name]: [name] Other Clinician: Referring [name]: Treating [name]/Extender: [name], [name] in Treatment: 0 Information Obtained From Patient Eyes Complaints and Symptoms: Positive for: Glasses / Contacts Medical History: Positive for: Cataracts - Removed Ear/Nose/Mouth/Throat Complaints and Symptoms: Negative for: Chronic sinus problems or rhinitis Gastrointestinal Complaints and Symptoms: Negative for: Frequent diarrhea; Nausea;  Vomiting Genitourinary Complaints and Symptoms: Negative for: Frequent urination Integumentary (Skin) Complaints and Symptoms: Positive for: Wounds - Bilat Legs Psychiatric Complaints and Symptoms: Negative for: Claustrophobia; Suicidal Hematologic/Lymphatic Respiratory Medical History: Positive for: Sleep Apnea Past Medical History Notes: Lung Cancer-surgery 2010 Cardiovascular Medical History: Positive for: Arrhythmia; Congestive Heart Failure; Coronary Artery Disease; Hypertension; Peripheral Venous Disease Endocrine Medical History: Positive for: Type II Diabetes Time with diabetes: Since 1996 Treated with: Insulin Blood sugar tested every day: Yes Tested : Has CGM, doesn't wear Immunological Musculoskeletal Medical History: Positive for: Osteoarthritis Neurologic Medical History: Positive for: Neuropathy Oncologic Medical History: Past Medical History Notes: Lung Cancer, no radiation or chemo HBO Extended History Items Eyes: Cataracts Immunizations Pneumococcal Vaccine: Received Pneumococcal Vaccination: Yes Received Pneumococcal Vaccination On or After 60th Birthday: Yes Implantable Devices None Family and Social History Cancer: Yes - Siblings; Diabetes: Yes - Siblings; Heart Disease: Yes - Mother,Father; Hereditary Spherocytosis: No; Hypertension: Yes - Father,Mother; Kidney Disease: No; Lung Disease: No; Seizures: No; Stroke: No; Thyroid Problems: No; Tuberculosis: No; Former smoker - Quit 1986; Marital Status - Single; Alcohol Use: Never; Drug Use: No History; Caffeine Use: Moderate; Financial Concerns: No; Food, Clothing or Shelter Needs: No; Support System Lacking: No; Transportation Concerns: No Electronic Signature(s) Signed: 08/05/2021 5:39:20 PM By: Lorrin Jackson Signed: 08/13/2021 5:50:22 PM By: Worthy Keeler PA-C Entered By: Lorrin Jackson on 08/05/2021  08:21:21 -------------------------------------------------------------------------------- SuperBill Details Patient Name: Date of Service: Brandi Morgans A. 08/05/2021 Medical Record Number: [id number] Patient Account Number: [id number] Date of Birth/Sex: Treating RN: 08/04/1951 (70 y.o. [name] Primary Care [name]: [name] Other Clinician: Referring [name]: Treating [name]/Extender: [name], [name] in Treatment: 0 Diagnosis Coding ICD-10 Codes Code Description I89.0 Lymphedema, not elsewhere classified I87.2 Venous insufficiency (chronic) (peripheral) L97.822 Non-pressure chronic ulcer of other part of left lower leg with fat layer exposed L97.812 Non-pressure chronic ulcer of other part of right lower leg with fat layer exposed E11.622 Type 2 diabetes mellitus with other skin ulcer I73.89 Other specified peripheral vascular diseases I10 Essential (primary) hypertension Z79.01 Long term (current) use of anticoagulants Facility Procedures CPT4 Code: 22297989 Description: [phone] - WOUND CARE VISIT-LEV 5 EST PT Modifier: 25 Quantity: 1 CPT4 Code: [id number] Description: 81856 - APPLY UNNA BOOT/PROFO BILATERAL ICD-10 Diagnosis Description I89.0 Lymphedema, not elsewhere classified Modifier: Quantity: 1 Physician Procedures : CPT4 Code Description Modifier [id number] - WC PHYS LEVEL 4 - NEW PT ICD-10 Diagnosis Description I89.0 Lymphedema, not elsewhere classified I87.2 Venous insufficiency (chronic) (peripheral) L97.822 Non-pressure chronic ulcer of other part of left  lower leg with fat layer exposed L97.812 Non-pressure chronic ulcer of other part of right lower leg with fat layer exposed Quantity: 1 Electronic Signature(s) Signed: 08/05/2021 4:46:42 PM By: Melburn Hake,  [name], [name] ([id number]) Visit Report for 08/05/2021 Chief Complaint Document Details Patient Name: Date of Service: [name], [name] 08/05/2021 7:30 A M Medical Record Number: [id number] Patient Account Number: [id number] Date of Birth/Sex: Treating RN: 05/11/1951 (70 y.o. [name]: [name] Other Clinician: Referring [name]: Treating [name]/Extender: [name], [name] in Treatment: 0 Information Obtained from: Patient Chief Complaint Bilateral LE ULcers Electronic Signature(s) Signed: 08/05/2021 8:53:56 AM By: Worthy Keeler PA-C Entered By: Worthy Keeler on 08/05/2021 08:53:55 -------------------------------------------------------------------------------- HPI Details Patient Name: Date of Service: Brandi Morgans A. 08/05/2021 7:30 A M Medical Record Number: [id number] Patient Account Number: [id number] Date of Birth/Sex: Treating RN: 04/10/1951 (70 y.o. [name]: [name] Other Clinician: Referring [name]: Treating [name]/Extender: Angelica Pou in Treatment: 0 History of Present Illness HPI Description: 08/05/2021 upon evaluation today patient has bilateral wounds on her lower extremities. She has been tolerating the dressing changes without complication at home but unfortunately is not really seeing signs of improvement like she would like to see. She does have what appears to be bilateral lymphedema due to chronic venous insufficiency most likely. She is on fluid pills and takes them regularly. She really does not sit with her legs elevated much she has a recliner but again does not use that often. I think she should be using this more to get her feet up. Subsequently she tells me this has been going on for several weeks to even months. She finally decided to make a referral herself to come in to be seen. Patient does  have a history of diabetes mellitus type 2, some evidence of potential peripheral vascular disease as she is noncompressible with her ABIs in the office today, hypertension, and she is on long-term anticoagulant therapy. Electronic Signature(s) Signed: 08/05/2021 4:44:25 PM By: Worthy Keeler PA-C Entered By: Worthy Keeler on 08/05/2021 16:44:25 -------------------------------------------------------------------------------- Physical Exam Details Patient Name: Date of Service: [name], [name] 08/05/2021 7:30 A M Medical Record Number: [id number] Patient Account Number: [id number] Date of Birth/Sex: Treating RN: 1951-01-08 (70 y.o. [name]: Other Clinician: [name] Referring [name]: Treating [name]/Extender: [name], [name] in Treatment: 0 Constitutional patient is hypertensive.. pulse regular and within target range for patient.Marland Kitchen respirations regular, non-labored and within target range for patient.Marland Kitchen temperature within target range for patient.. Well-nourished and well-hydrated in no acute distress. Eyes conjunctiva clear no eyelid edema noted. pupils equal round and reactive to light and accommodation. Ears, Nose, Mouth, and Throat no gross abnormality of ear auricles or external auditory canals. normal hearing noted during conversation. mucus membranes moist. Respiratory normal breathing without difficulty. Cardiovascular 1+ dorsalis pedis/posterior tibialis pulses. 1+ pitting edema of the bilateral lower extremities. Musculoskeletal normal gait and posture. no significant deformity or arthritic changes, no loss or range of motion, no clubbing. Psychiatric this patient is able to make decisions and demonstrates good insight into disease process. Alert and Oriented x 3. pleasant and cooperative. Notes Upon inspection patient's wound bed actually showed signs of fairly good granulation and some areas  there are blisters and others and overall I think that she does need to have this appropriately managed I think compression therapy is going to be key. Nonetheless we do need to send her for formal arterial testing due to the fact that again her screening test here in the clinic was 9 compressible not  [name], [name] ([id number]) Visit Report for 08/05/2021 Chief Complaint Document Details Patient Name: Date of Service: [name], [name] 08/05/2021 7:30 A M Medical Record Number: [id number] Patient Account Number: [id number] Date of Birth/Sex: Treating RN: 05/11/1951 (70 y.o. [name]: [name] Other Clinician: Referring [name]: Treating [name]/Extender: [name], [name] in Treatment: 0 Information Obtained from: Patient Chief Complaint Bilateral LE ULcers Electronic Signature(s) Signed: 08/05/2021 8:53:56 AM By: Worthy Keeler PA-C Entered By: Worthy Keeler on 08/05/2021 08:53:55 -------------------------------------------------------------------------------- HPI Details Patient Name: Date of Service: Brandi Morgans A. 08/05/2021 7:30 A M Medical Record Number: [id number] Patient Account Number: [id number] Date of Birth/Sex: Treating RN: 04/10/1951 (70 y.o. [name]: [name] Other Clinician: Referring [name]: Treating [name]/Extender: Angelica Pou in Treatment: 0 History of Present Illness HPI Description: 08/05/2021 upon evaluation today patient has bilateral wounds on her lower extremities. She has been tolerating the dressing changes without complication at home but unfortunately is not really seeing signs of improvement like she would like to see. She does have what appears to be bilateral lymphedema due to chronic venous insufficiency most likely. She is on fluid pills and takes them regularly. She really does not sit with her legs elevated much she has a recliner but again does not use that often. I think she should be using this more to get her feet up. Subsequently she tells me this has been going on for several weeks to even months. She finally decided to make a referral herself to come in to be seen. Patient does  have a history of diabetes mellitus type 2, some evidence of potential peripheral vascular disease as she is noncompressible with her ABIs in the office today, hypertension, and she is on long-term anticoagulant therapy. Electronic Signature(s) Signed: 08/05/2021 4:44:25 PM By: Worthy Keeler PA-C Entered By: Worthy Keeler on 08/05/2021 16:44:25 -------------------------------------------------------------------------------- Physical Exam Details Patient Name: Date of Service: [name], [name] 08/05/2021 7:30 A M Medical Record Number: [id number] Patient Account Number: [id number] Date of Birth/Sex: Treating RN: 1951-01-08 (70 y.o. [name]: Other Clinician: [name] Referring [name]: Treating [name]/Extender: [name], [name] in Treatment: 0 Constitutional patient is hypertensive.. pulse regular and within target range for patient.Marland Kitchen respirations regular, non-labored and within target range for patient.Marland Kitchen temperature within target range for patient.. Well-nourished and well-hydrated in no acute distress. Eyes conjunctiva clear no eyelid edema noted. pupils equal round and reactive to light and accommodation. Ears, Nose, Mouth, and Throat no gross abnormality of ear auricles or external auditory canals. normal hearing noted during conversation. mucus membranes moist. Respiratory normal breathing without difficulty. Cardiovascular 1+ dorsalis pedis/posterior tibialis pulses. 1+ pitting edema of the bilateral lower extremities. Musculoskeletal normal gait and posture. no significant deformity or arthritic changes, no loss or range of motion, no clubbing. Psychiatric this patient is able to make decisions and demonstrates good insight into disease process. Alert and Oriented x 3. pleasant and cooperative. Notes Upon inspection patient's wound bed actually showed signs of fairly good granulation and some areas  there are blisters and others and overall I think that she does need to have this appropriately managed I think compression therapy is going to be key. Nonetheless we do need to send her for formal arterial testing due to the fact that again her screening test here in the clinic was 9 compressible not  [name], [name] ([id number]) Visit Report for 08/05/2021 Chief Complaint Document Details Patient Name: Date of Service: [name], [name] 08/05/2021 7:30 A M Medical Record Number: [id number] Patient Account Number: [id number] Date of Birth/Sex: Treating RN: 05/11/1951 (70 y.o. [name]: [name] Other Clinician: Referring [name]: Treating [name]/Extender: [name], [name] in Treatment: 0 Information Obtained from: Patient Chief Complaint Bilateral LE ULcers Electronic Signature(s) Signed: 08/05/2021 8:53:56 AM By: Worthy Keeler PA-C Entered By: Worthy Keeler on 08/05/2021 08:53:55 -------------------------------------------------------------------------------- HPI Details Patient Name: Date of Service: Brandi Morgans A. 08/05/2021 7:30 A M Medical Record Number: [id number] Patient Account Number: [id number] Date of Birth/Sex: Treating RN: 04/10/1951 (70 y.o. [name]: [name] Other Clinician: Referring [name]: Treating [name]/Extender: Angelica Pou in Treatment: 0 History of Present Illness HPI Description: 08/05/2021 upon evaluation today patient has bilateral wounds on her lower extremities. She has been tolerating the dressing changes without complication at home but unfortunately is not really seeing signs of improvement like she would like to see. She does have what appears to be bilateral lymphedema due to chronic venous insufficiency most likely. She is on fluid pills and takes them regularly. She really does not sit with her legs elevated much she has a recliner but again does not use that often. I think she should be using this more to get her feet up. Subsequently she tells me this has been going on for several weeks to even months. She finally decided to make a referral herself to come in to be seen. Patient does  have a history of diabetes mellitus type 2, some evidence of potential peripheral vascular disease as she is noncompressible with her ABIs in the office today, hypertension, and she is on long-term anticoagulant therapy. Electronic Signature(s) Signed: 08/05/2021 4:44:25 PM By: Worthy Keeler PA-C Entered By: Worthy Keeler on 08/05/2021 16:44:25 -------------------------------------------------------------------------------- Physical Exam Details Patient Name: Date of Service: [name], [name] 08/05/2021 7:30 A M Medical Record Number: [id number] Patient Account Number: [id number] Date of Birth/Sex: Treating RN: 1951-01-08 (70 y.o. [name]: Other Clinician: [name] Referring [name]: Treating [name]/Extender: [name], [name] in Treatment: 0 Constitutional patient is hypertensive.. pulse regular and within target range for patient.Marland Kitchen respirations regular, non-labored and within target range for patient.Marland Kitchen temperature within target range for patient.. Well-nourished and well-hydrated in no acute distress. Eyes conjunctiva clear no eyelid edema noted. pupils equal round and reactive to light and accommodation. Ears, Nose, Mouth, and Throat no gross abnormality of ear auricles or external auditory canals. normal hearing noted during conversation. mucus membranes moist. Respiratory normal breathing without difficulty. Cardiovascular 1+ dorsalis pedis/posterior tibialis pulses. 1+ pitting edema of the bilateral lower extremities. Musculoskeletal normal gait and posture. no significant deformity or arthritic changes, no loss or range of motion, no clubbing. Psychiatric this patient is able to make decisions and demonstrates good insight into disease process. Alert and Oriented x 3. pleasant and cooperative. Notes Upon inspection patient's wound bed actually showed signs of fairly good granulation and some areas  there are blisters and others and overall I think that she does need to have this appropriately managed I think compression therapy is going to be key. Nonetheless we do need to send her for formal arterial testing due to the fact that again her screening test here in the clinic was 9 compressible not  [name], [name] ([id number]) Visit Report for 08/05/2021 Chief Complaint Document Details Patient Name: Date of Service: [name], [name] 08/05/2021 7:30 A M Medical Record Number: [id number] Patient Account Number: [id number] Date of Birth/Sex: Treating RN: 05/11/1951 (70 y.o. [name]: [name] Other Clinician: Referring [name]: Treating [name]/Extender: [name], [name] in Treatment: 0 Information Obtained from: Patient Chief Complaint Bilateral LE ULcers Electronic Signature(s) Signed: 08/05/2021 8:53:56 AM By: Worthy Keeler PA-C Entered By: Worthy Keeler on 08/05/2021 08:53:55 -------------------------------------------------------------------------------- HPI Details Patient Name: Date of Service: Brandi Morgans A. 08/05/2021 7:30 A M Medical Record Number: [id number] Patient Account Number: [id number] Date of Birth/Sex: Treating RN: 04/10/1951 (70 y.o. [name]: [name] Other Clinician: Referring [name]: Treating [name]/Extender: Angelica Pou in Treatment: 0 History of Present Illness HPI Description: 08/05/2021 upon evaluation today patient has bilateral wounds on her lower extremities. She has been tolerating the dressing changes without complication at home but unfortunately is not really seeing signs of improvement like she would like to see. She does have what appears to be bilateral lymphedema due to chronic venous insufficiency most likely. She is on fluid pills and takes them regularly. She really does not sit with her legs elevated much she has a recliner but again does not use that often. I think she should be using this more to get her feet up. Subsequently she tells me this has been going on for several weeks to even months. She finally decided to make a referral herself to come in to be seen. Patient does  have a history of diabetes mellitus type 2, some evidence of potential peripheral vascular disease as she is noncompressible with her ABIs in the office today, hypertension, and she is on long-term anticoagulant therapy. Electronic Signature(s) Signed: 08/05/2021 4:44:25 PM By: Worthy Keeler PA-C Entered By: Worthy Keeler on 08/05/2021 16:44:25 -------------------------------------------------------------------------------- Physical Exam Details Patient Name: Date of Service: [name], [name] 08/05/2021 7:30 A M Medical Record Number: [id number] Patient Account Number: [id number] Date of Birth/Sex: Treating RN: 1951-01-08 (70 y.o. [name]: Other Clinician: [name] Referring [name]: Treating [name]/Extender: [name], [name] in Treatment: 0 Constitutional patient is hypertensive.. pulse regular and within target range for patient.Marland Kitchen respirations regular, non-labored and within target range for patient.Marland Kitchen temperature within target range for patient.. Well-nourished and well-hydrated in no acute distress. Eyes conjunctiva clear no eyelid edema noted. pupils equal round and reactive to light and accommodation. Ears, Nose, Mouth, and Throat no gross abnormality of ear auricles or external auditory canals. normal hearing noted during conversation. mucus membranes moist. Respiratory normal breathing without difficulty. Cardiovascular 1+ dorsalis pedis/posterior tibialis pulses. 1+ pitting edema of the bilateral lower extremities. Musculoskeletal normal gait and posture. no significant deformity or arthritic changes, no loss or range of motion, no clubbing. Psychiatric this patient is able to make decisions and demonstrates good insight into disease process. Alert and Oriented x 3. pleasant and cooperative. Notes Upon inspection patient's wound bed actually showed signs of fairly good granulation and some areas  there are blisters and others and overall I think that she does need to have this appropriately managed I think compression therapy is going to be key. Nonetheless we do need to send her for formal arterial testing due to the fact that again her screening test here in the clinic was 9 compressible not  [name], [name] ([id number]) Visit Report for 08/05/2021 Chief Complaint Document Details Patient Name: Date of Service: [name], [name] 08/05/2021 7:30 A M Medical Record Number: [id number] Patient Account Number: [id number] Date of Birth/Sex: Treating RN: 05/11/1951 (70 y.o. [name]: [name] Other Clinician: Referring [name]: Treating [name]/Extender: [name], [name] in Treatment: 0 Information Obtained from: Patient Chief Complaint Bilateral LE ULcers Electronic Signature(s) Signed: 08/05/2021 8:53:56 AM By: Worthy Keeler PA-C Entered By: Worthy Keeler on 08/05/2021 08:53:55 -------------------------------------------------------------------------------- HPI Details Patient Name: Date of Service: Brandi Morgans A. 08/05/2021 7:30 A M Medical Record Number: [id number] Patient Account Number: [id number] Date of Birth/Sex: Treating RN: 04/10/1951 (70 y.o. [name]: [name] Other Clinician: Referring [name]: Treating [name]/Extender: Angelica Pou in Treatment: 0 History of Present Illness HPI Description: 08/05/2021 upon evaluation today patient has bilateral wounds on her lower extremities. She has been tolerating the dressing changes without complication at home but unfortunately is not really seeing signs of improvement like she would like to see. She does have what appears to be bilateral lymphedema due to chronic venous insufficiency most likely. She is on fluid pills and takes them regularly. She really does not sit with her legs elevated much she has a recliner but again does not use that often. I think she should be using this more to get her feet up. Subsequently she tells me this has been going on for several weeks to even months. She finally decided to make a referral herself to come in to be seen. Patient does  have a history of diabetes mellitus type 2, some evidence of potential peripheral vascular disease as she is noncompressible with her ABIs in the office today, hypertension, and she is on long-term anticoagulant therapy. Electronic Signature(s) Signed: 08/05/2021 4:44:25 PM By: Worthy Keeler PA-C Entered By: Worthy Keeler on 08/05/2021 16:44:25 -------------------------------------------------------------------------------- Physical Exam Details Patient Name: Date of Service: [name], [name] 08/05/2021 7:30 A M Medical Record Number: [id number] Patient Account Number: [id number] Date of Birth/Sex: Treating RN: 1951-01-08 (70 y.o. [name]: Other Clinician: [name] Referring [name]: Treating [name]/Extender: [name], [name] in Treatment: 0 Constitutional patient is hypertensive.. pulse regular and within target range for patient.Marland Kitchen respirations regular, non-labored and within target range for patient.Marland Kitchen temperature within target range for patient.. Well-nourished and well-hydrated in no acute distress. Eyes conjunctiva clear no eyelid edema noted. pupils equal round and reactive to light and accommodation. Ears, Nose, Mouth, and Throat no gross abnormality of ear auricles or external auditory canals. normal hearing noted during conversation. mucus membranes moist. Respiratory normal breathing without difficulty. Cardiovascular 1+ dorsalis pedis/posterior tibialis pulses. 1+ pitting edema of the bilateral lower extremities. Musculoskeletal normal gait and posture. no significant deformity or arthritic changes, no loss or range of motion, no clubbing. Psychiatric this patient is able to make decisions and demonstrates good insight into disease process. Alert and Oriented x 3. pleasant and cooperative. Notes Upon inspection patient's wound bed actually showed signs of fairly good granulation and some areas  there are blisters and others and overall I think that she does need to have this appropriately managed I think compression therapy is going to be key. Nonetheless we do need to send her for formal arterial testing due to the fact that again her screening test here in the clinic was 9 compressible not

## 2021-08-19 ENCOUNTER — Encounter (HOSPITAL_BASED_OUTPATIENT_CLINIC_OR_DEPARTMENT_OTHER): Payer: HMO | Admitting: Physician Assistant

## 2021-08-19 ENCOUNTER — Other Ambulatory Visit: Payer: Self-pay

## 2021-08-19 DIAGNOSIS — L97812 Non-pressure chronic ulcer of other part of right lower leg with fat layer exposed: Secondary | ICD-10-CM | POA: Diagnosis not present

## 2021-08-19 DIAGNOSIS — I890 Lymphedema, not elsewhere classified: Secondary | ICD-10-CM | POA: Diagnosis not present

## 2021-08-19 DIAGNOSIS — I872 Venous insufficiency (chronic) (peripheral): Secondary | ICD-10-CM | POA: Diagnosis not present

## 2021-08-19 DIAGNOSIS — L97822 Non-pressure chronic ulcer of other part of left lower leg with fat layer exposed: Secondary | ICD-10-CM | POA: Diagnosis not present

## 2021-08-19 NOTE — Progress Notes (Signed)
Simple Wound Cleansing - one wound []  - 0 Complex Wound Cleansing - multiple wounds X- 1 5 Wound Imaging (photographs - any number of wounds) []  - 0 Wound Tracing (instead of photographs) []  - 0 Simple Wound Measurement - one wound []  - 0 Complex Wound Measurement - multiple wounds INTERVENTIONS - Wound Dressings []  - 0 Small Wound Dressing one or multiple wounds []  - 0 Medium Wound Dressing one or multiple wounds []  - 0 Large Wound Dressing one or multiple wounds []  - 0 Application of Medications - topical []  - 0 Application of Medications - injection INTERVENTIONS - Miscellaneous []  - 0 External ear exam []  - 0 Specimen Collection (cultures, biopsies, blood, body fluids, etc.) []  - 0 Specimen(s) / Culture(s) sent or taken to Lab for analysis []  - 0 Patient Transfer (multiple staff / Civil Service fast streamer / Similar devices) []  - 0 Simple Staple / Suture removal (25 or less) []  - 0 Complex Staple / Suture removal (26 or more) []  - 0 Hypo / Hyperglycemic Management (close monitor of Blood Glucose) []  - 0 Ankle / Brachial Index (ABI) - do not check if billed separately X- 1 5 Vital Signs Has the patient been seen at the hospital within the last three years: Yes Total Score: 65 Level Of Care: New/Established - Level 2 Electronic Signature(s) Signed: 08/19/2021 4:59:20 PM By: Lorrin Jackson Entered By: Lorrin Jackson on 08/19/2021 10:08:54 -------------------------------------------------------------------------------- Encounter Discharge Information Details Patient Name: Date of Service: Brandi Morgans A. 08/19/2021 8:45 A M Medical Record Number: [id number] Patient Account Number: [id number] Date of Birth/Sex: Treating RN: 03-25-1951 (70 y.o. [name] Primary  Care Arval Brandstetter: [name] Other Clinician: Referring Reiko Vinje: Treating Damascus Feldpausch/Extender: Angelica Pou in Treatment: 2 Encounter Discharge Information Items Discharge Condition: Stable Ambulatory Status: Wheelchair Discharge Destination: Home Transportation: Private Auto Schedule Follow-up Appointment: No Clinical Summary of Care: Provided on 08/19/2021 Form Type Recipient Paper Patient Patient Electronic Signature(s) Signed: 08/19/2021 12:44:17 PM By: Lorrin Jackson Entered By: Lorrin Jackson on 08/19/2021 12:44:16 -------------------------------------------------------------------------------- Lower Extremity Assessment Details Patient Name: Date of Service: [name], [name] 08/19/2021 8:45 A M Medical Record Number: [id number] Patient Account Number: [id number] Date of Birth/Sex: Treating RN: 05-Jan-1951 (70 y.o. [name] Primary Care Bush Murdoch: [name] Other Clinician: Referring Alfonzo Arca: Treating Kattleya Kuhnert/Extender: [name], [name] in Treatment: 2 Edema Assessment Assessed: [Left: Yes] [Right: Yes] Edema: [Left: Yes] [Right: Yes] Calf Left: Right: Point of Measurement: 29 cm From Medial Instep 46.5 cm 48.5 cm Ankle Left: Right: Point of Measurement: 8 cm From Medial Instep 25.5 cm 27 cm Vascular Assessment Pulses: Dorsalis Pedis Palpable: [Left:Yes] [Right:Yes] Electronic Signature(s) Signed: 08/19/2021 4:59:20 PM By: Lorrin Jackson Entered By: Lorrin Jackson on 08/19/2021 09:23:06 -------------------------------------------------------------------------------- Multi-Disciplinary Care Plan Details Patient Name: Date of Service: Brandi Morgans A. 08/19/2021 8:45 A M Medical Record Number: [id number] Patient Account Number: [id number] Date of Birth/Sex: Treating RN: 11/12/1950 (70 y.o. [name] Primary Care Caylea Foronda: [name] Other Clinician: Referring  Lakecia Deschamps: Treating Joshuajames Moehring/Extender: [name], [name] in Treatment: 2 Active Inactive Electronic Signature(s) Signed: 08/19/2021 4:59:20 PM By: Lorrin Jackson Entered By: Lorrin Jackson on 08/19/2021 10:09:44 -------------------------------------------------------------------------------- Pain Assessment Details Patient Name: Date of Service: Brandi Morgans A. 08/19/2021 8:45 A M Medical Record Number: [id number] Patient Account Number: [id number] Date of Birth/Sex: Treating RN: 01-02-1951 (70 y.o. [name] Primary Care Madinah Quarry: [name] Other Clinician: Referring Antonino Nienhuis: Treating Terryn Redner/Extender: Joaquim Lai  [name], [name] ([id number]) Visit Report for 08/19/2021 Arrival Information Details Patient Name: Date of Service: [name], [name] 08/19/2021 8:45 A M Medical Record Number: [id number] Patient Account Number: [id number] Date of Birth/Sex: Treating RN: 05-04-1951 (70 y.o. [name] Primary Care Ysabelle Goodroe: [name] Other Clinician: Referring Kawana Hegel: Treating Emanuelle Bastos/Extender: Angelica Pou in Treatment: 2 Visit Information History Since Last Visit Added or deleted any medications: No Patient Arrived: Wheel Chair Any new allergies or adverse reactions: No Arrival Time: 09:09 Had a fall or experienced change in No Transfer Assistance: Manual activities of daily living that may affect Patient Identification Verified: Yes risk of falls: Secondary Verification Process Completed: Yes Signs or symptoms of abuse/neglect since last visito No Patient Requires Transmission-Based Precautions: No Hospitalized since last visit: No Patient Has Alerts: Yes Implantable device outside of the clinic excluding No Patient Alerts: Patient on Blood Thinner cellular tissue based products placed in the center ABI's Bilat =McPherson since last visit: Has Dressing in Place as Prescribed: Yes Has Compression in Place as Prescribed: Yes Pain Present Now: No Electronic Signature(s) Signed: 08/19/2021 4:59:20 PM By: Lorrin Jackson Entered By: Lorrin Jackson on 08/19/2021 09:15:41 -------------------------------------------------------------------------------- Clinic Level of Care Assessment Details Patient Name: Date of Service: [name], [name] 08/19/2021 8:45 A M Medical Record Number: [id number] Patient Account Number: [id number] Date of Birth/Sex: Treating RN: 14-Apr-1951 (70 y.o. [name] Primary Care Laderrick Wilk: [name] Other Clinician: Referring Pennelope Basque: Treating Abyan Cadman/Extender: Angelica Pou in Treatment: 2 Clinic Level of Care Assessment Items TOOL 4 Quantity Score X- 1 0 Use when only an EandM is performed on FOLLOW-UP visit ASSESSMENTS - Nursing Assessment / Reassessment X- 1 10 Reassessment of Co-morbidities (includes updates in patient status) X- 1 5 Reassessment of Adherence to Treatment Plan ASSESSMENTS - Wound and Skin A ssessment / Reassessment []  - 0 Simple Wound Assessment / Reassessment - one wound X- 2 5 Complex Wound Assessment / Reassessment - multiple wounds []  - 0 Dermatologic / Skin Assessment (not related to wound area) ASSESSMENTS - Focused Assessment []  - 0 Circumferential Edema Measurements - multi extremities []  - 0 Nutritional Assessment / Counseling / Intervention []  - 0 Lower Extremity Assessment (monofilament, tuning fork, pulses) []  - 0 Peripheral Arterial Disease Assessment (using hand held doppler) ASSESSMENTS - Ostomy and/or Continence Assessment and Care []  - 0 Incontinence Assessment and Management []  - 0 Ostomy Care Assessment and Management (repouching, etc.) PROCESS - Coordination of Care []  - 0 Simple Patient / Family Education for ongoing care X- 1 20 Complex (extensive) Patient / Family Education for ongoing care []  - 0 Staff obtains Programmer, systems, Records, T Results / Process Orders est []  - 0 Staff telephones HHA, Nursing Homes / Clarify orders / etc []  - 0 Routine Transfer to another Facility (non-emergent condition) []  - 0 Routine Hospital Admission (non-emergent condition) []  - 0 New Admissions / Biomedical engineer / Ordering NPWT Apligraf, etc. , []  - 0 Emergency Hospital Admission (emergent condition) X- 1 10 Simple Discharge Coordination []  - 0 Complex (extensive) Discharge Coordination PROCESS - Special Needs []  - 0 Pediatric / Minor Patient Management []  - 0 Isolation Patient Management []  - 0 Hearing / Language / Visual special needs []  - 0 Assessment of Community assistance  (transportation, D/C planning, etc.) []  - 0 Additional assistance / Altered mentation []  - 0 Support Surface(s) Assessment (bed, cushion, seat, etc.) INTERVENTIONS - Wound Cleansing / Measurement []  - 0  Simple Wound Cleansing - one wound []  - 0 Complex Wound Cleansing - multiple wounds X- 1 5 Wound Imaging (photographs - any number of wounds) []  - 0 Wound Tracing (instead of photographs) []  - 0 Simple Wound Measurement - one wound []  - 0 Complex Wound Measurement - multiple wounds INTERVENTIONS - Wound Dressings []  - 0 Small Wound Dressing one or multiple wounds []  - 0 Medium Wound Dressing one or multiple wounds []  - 0 Large Wound Dressing one or multiple wounds []  - 0 Application of Medications - topical []  - 0 Application of Medications - injection INTERVENTIONS - Miscellaneous []  - 0 External ear exam []  - 0 Specimen Collection (cultures, biopsies, blood, body fluids, etc.) []  - 0 Specimen(s) / Culture(s) sent or taken to Lab for analysis []  - 0 Patient Transfer (multiple staff / Civil Service fast streamer / Similar devices) []  - 0 Simple Staple / Suture removal (25 or less) []  - 0 Complex Staple / Suture removal (26 or more) []  - 0 Hypo / Hyperglycemic Management (close monitor of Blood Glucose) []  - 0 Ankle / Brachial Index (ABI) - do not check if billed separately X- 1 5 Vital Signs Has the patient been seen at the hospital within the last three years: Yes Total Score: 65 Level Of Care: New/Established - Level 2 Electronic Signature(s) Signed: 08/19/2021 4:59:20 PM By: Lorrin Jackson Entered By: Lorrin Jackson on 08/19/2021 10:08:54 -------------------------------------------------------------------------------- Encounter Discharge Information Details Patient Name: Date of Service: Brandi Morgans A. 08/19/2021 8:45 A M Medical Record Number: [id number] Patient Account Number: [id number] Date of Birth/Sex: Treating RN: 03-25-1951 (70 y.o. [name] Primary  Care Arval Brandstetter: [name] Other Clinician: Referring Reiko Vinje: Treating Damascus Feldpausch/Extender: Angelica Pou in Treatment: 2 Encounter Discharge Information Items Discharge Condition: Stable Ambulatory Status: Wheelchair Discharge Destination: Home Transportation: Private Auto Schedule Follow-up Appointment: No Clinical Summary of Care: Provided on 08/19/2021 Form Type Recipient Paper Patient Patient Electronic Signature(s) Signed: 08/19/2021 12:44:17 PM By: Lorrin Jackson Entered By: Lorrin Jackson on 08/19/2021 12:44:16 -------------------------------------------------------------------------------- Lower Extremity Assessment Details Patient Name: Date of Service: [name], [name] 08/19/2021 8:45 A M Medical Record Number: [id number] Patient Account Number: [id number] Date of Birth/Sex: Treating RN: 05-Jan-1951 (70 y.o. [name] Primary Care Bush Murdoch: [name] Other Clinician: Referring Alfonzo Arca: Treating Kattleya Kuhnert/Extender: [name], [name] in Treatment: 2 Edema Assessment Assessed: [Left: Yes] [Right: Yes] Edema: [Left: Yes] [Right: Yes] Calf Left: Right: Point of Measurement: 29 cm From Medial Instep 46.5 cm 48.5 cm Ankle Left: Right: Point of Measurement: 8 cm From Medial Instep 25.5 cm 27 cm Vascular Assessment Pulses: Dorsalis Pedis Palpable: [Left:Yes] [Right:Yes] Electronic Signature(s) Signed: 08/19/2021 4:59:20 PM By: Lorrin Jackson Entered By: Lorrin Jackson on 08/19/2021 09:23:06 -------------------------------------------------------------------------------- Multi-Disciplinary Care Plan Details Patient Name: Date of Service: Brandi Morgans A. 08/19/2021 8:45 A M Medical Record Number: [id number] Patient Account Number: [id number] Date of Birth/Sex: Treating RN: 11/12/1950 (70 y.o. [name] Primary Care Caylea Foronda: [name] Other Clinician: Referring  Lakecia Deschamps: Treating Joshuajames Moehring/Extender: [name], [name] in Treatment: 2 Active Inactive Electronic Signature(s) Signed: 08/19/2021 4:59:20 PM By: Lorrin Jackson Entered By: Lorrin Jackson on 08/19/2021 10:09:44 -------------------------------------------------------------------------------- Pain Assessment Details Patient Name: Date of Service: Brandi Morgans A. 08/19/2021 8:45 A M Medical Record Number: [id number] Patient Account Number: [id number] Date of Birth/Sex: Treating RN: 01-02-1951 (70 y.o. [name] Primary Care Madinah Quarry: [name] Other Clinician: Referring Antonino Nienhuis: Treating Terryn Redner/Extender: Joaquim Lai

## 2021-08-19 NOTE — Progress Notes (Addendum)
[name], [name] ([id number]) Visit Report for 08/19/2021 Chief Complaint Document Details Patient Name: Date of Service: JOURY, ALLCORN A. 08/19/2021 8:45 A M Medical Record Number: [id number] Patient Account Number: [id number] Date of Birth/Sex: Treating RN: [birth year] (70 y.o. Elam Dutch Primary Care Provider: [name] Other Clinician: Referring Provider: Treating Provider/Extender: [name], [name] in Treatment: 2 Information Obtained from: Patient Chief Complaint Bilateral LE ULcers Electronic Signature(s) Signed: 08/19/2021 9:11:18 AM By: Worthy Keeler PA-C Entered By: Worthy Keeler on 08/19/2021 09:11:18 -------------------------------------------------------------------------------- HPI Details Patient Name: Date of Service: [name] A. 08/19/2021 8:45 A M Medical Record Number: [id number] Patient Account Number: [id number] Date of Birth/Sex: Treating RN: May 16, 1951 (70 y.o. Elam Dutch Primary Care Provider: [name] Other Clinician: Referring Provider: Treating Provider/Extender: Angelica Pou in Treatment: 2 History of Present Illness HPI Description: 08/05/2021 upon evaluation today patient has bilateral wounds on her lower extremities. She has been tolerating the dressing changes without complication at home but unfortunately is not really seeing signs of improvement like she would like to see. She does have what appears to be bilateral lymphedema due to chronic venous insufficiency most likely. She is on fluid pills and takes them regularly. She really does not sit with her legs elevated much she has a recliner but again does not use that often. I think she should be using this more to get her feet up. Subsequently she tells me this has been going on for several weeks to even months. She finally decided to make a referral herself to come in to be seen. Patient  does have a history of diabetes mellitus type 2, some evidence of potential peripheral vascular disease as she is noncompressible with her ABIs in the office today, hypertension, and she is on long-term anticoagulant therapy. 08/12/2021 upon evaluation today patient's wounds appear to be doing well in regard to her legs currently. Actually very pleased with where things stand and I do not see any signs of active infection which is great news. No fevers, chills, nausea, vomiting, or diarrhea. 08/19/2021 upon evaluation today patient appears to be doing well with regard to her legs. Everything actually appears to be healing quite nicely with doing extremely well. Fortunately there does not appear to be any signs of active infection which is great news. Electronic Signature(s) Signed: 08/19/2021 9:49:04 AM By: Worthy Keeler PA-C Entered By: Worthy Keeler on 08/19/2021 09:49:04 -------------------------------------------------------------------------------- Physical Exam Details Patient Name: Date of Service: [name] A. 08/19/2021 8:45 A M Medical Record Number: [id number] Patient Account Number: [id number] Date of Birth/Sex: Treating RN: 1951-02-26 (70 y.o. Elam Dutch Primary Care Provider: [name] Other Clinician: Referring Provider: Treating Provider/Extender: [name], [name] in Treatment: 2 Constitutional Well-nourished and well-hydrated in no acute distress. Respiratory normal breathing without difficulty. Psychiatric this patient is able to make decisions and demonstrates good insight into disease process. Alert and Oriented x 3. pleasant and cooperative. Notes Patient's wound bed showed signs of good epithelization there does not appear to be anything open on either leg which is great news she does have her juxta lites with her today. Electronic Signature(s) Signed: 08/19/2021 9:49:17 AM By: Worthy Keeler PA-C Entered By:  Worthy Keeler on 08/19/2021 09:49:17 -------------------------------------------------------------------------------- Physician Orders Details Patient Name: Date of Service: [name] A. 08/19/2021 8:45 A M Medical Record Number: [id number] Patient Account Number: [id number] Date of Birth/Sex:  Treating RN: 10-Oct-1950 (70 y.o. [name] Primary Care Provider: [name] Other Clinician: Referring Provider: Treating Provider/Extender: Angelica Pou in Treatment: 2 Verbal / Phone Orders: No Diagnosis Coding ICD-10 Coding Code Description I89.0 Lymphedema, not elsewhere classified I87.2 Venous insufficiency (chronic) (peripheral) L97.822 Non-pressure chronic ulcer of other part of left lower leg with fat layer exposed L97.812 Non-pressure chronic ulcer of other part of right lower leg with fat layer exposed E11.622 Type 2 diabetes mellitus with other skin ulcer I73.89 Other specified peripheral vascular diseases I10 Essential (primary) hypertension Z79.01 Long term (current) use of anticoagulants Discharge From [facility] Services Discharge from [place] - No further follow up needed, call if any issues. Edema Control - Lymphedema / SCD / Other Other Edema Control Orders/Instructions: - JuxtaLite Compression Wraps to both legs: apply first thing in morning and remove at bedtime. Additional Orders / Instructions Other: - Apply moisturizing lotion to legs in evening after removing JuxtaLites Electronic Signature(s) Signed: 08/19/2021 4:59:20 PM By: Lorrin Jackson Signed: 08/19/2021 5:33:12 PM By: Worthy Keeler PA-C Entered By: Lorrin Jackson on 08/19/2021 09:46:42 -------------------------------------------------------------------------------- Problem List Details Patient Name: Date of Service: [name] A. 08/19/2021 8:45 A M Medical Record Number: [id number] Patient Account Number: [id number] Date of  Birth/Sex: Treating RN: 11-18-1950 (70 y.o. [name] Primary Care Provider: [name] Other Clinician: Referring Provider: Treating Provider/Extender: Angelica Pou in Treatment: 2 Active Problems ICD-10 Encounter Code Description Active Date MDM Diagnosis I89.0 Lymphedema, not elsewhere classified 08/05/2021 No Yes I87.2 Venous insufficiency (chronic) (peripheral) 08/05/2021 No Yes L97.822 Non-pressure chronic ulcer of other part of left lower leg with fat layer exposed11/05/2021 No Yes L97.812 Non-pressure chronic ulcer of other part of right lower leg with fat layer 08/05/2021 No Yes exposed E11.622 Type 2 diabetes mellitus with other skin ulcer 08/05/2021 No Yes I73.89 Other specified peripheral vascular diseases 08/05/2021 No Yes I10 Essential (primary) hypertension 08/05/2021 No Yes Z79.01 Long term (current) use of anticoagulants 08/05/2021 No Yes Inactive Problems Resolved Problems Electronic Signature(s) Signed: 08/19/2021 9:11:13 AM By: Worthy Keeler PA-C Entered By: Worthy Keeler on 08/19/2021 09:11:12 -------------------------------------------------------------------------------- Progress Note Details Patient Name: Date of Service: [name] A. 08/19/2021 8:45 A M Medical Record Number: [id number] Patient Account Number: [id number] Date of Birth/Sex: Treating RN: 10-Sep-1951 (70 y.o. Elam Dutch Primary Care Provider: [name] Other Clinician: Referring Provider: Treating Provider/Extender: Angelica Pou in Treatment: 2 Subjective Chief Complaint Information obtained from Patient Bilateral LE ULcers History of Present Illness (HPI) 08/05/2021 upon evaluation today patient has bilateral wounds on her lower extremities. She has been tolerating the dressing changes without complication at home but unfortunately is not really seeing signs of improvement like she would  like to see. She does have what appears to be bilateral lymphedema due to chronic venous insufficiency most likely. She is on fluid pills and takes them regularly. She really does not sit with her legs elevated much she has a recliner but again does not use that often. I think she should be using this more to get her feet up. Subsequently she tells me this has been going on for several weeks to even months. She finally decided to make a referral herself to come in to be seen. Patient does have a history of diabetes mellitus type 2, some evidence of potential peripheral vascular disease as she is noncompressible with her ABIs in the office today,  hypertension, and she is on long-term anticoagulant therapy. 08/12/2021 upon evaluation today patient's wounds appear to be doing well in regard to her legs currently. Actually very pleased with where things stand and I do not see any signs of active infection which is great news. No fevers, chills, nausea, vomiting, or diarrhea. 08/19/2021 upon evaluation today patient appears to be doing well with regard to her legs. Everything actually appears to be healing quite nicely with doing extremely well. Fortunately there does not appear to be any signs of active infection which is great news. Objective Constitutional Well-nourished and well-hydrated in no acute distress. Vitals Time Taken: 9:15 AM, Height: 63 in, Weight: 282 lbs, BMI: 49.9, Temperature: 98.1 F, Pulse: 92 bpm, Respiratory Rate: 18 breaths/min, Blood Pressure: 136/78 mmHg. General Notes: Patient states she hasn't checked glucose this morning. Respiratory normal breathing without difficulty. Psychiatric this patient is able to make decisions and demonstrates good insight into disease process. Alert and Oriented x 3. pleasant and cooperative. General Notes: Patient's wound bed showed signs of good epithelization there does not appear to be anything open on either leg which is great news she  does have her juxta lites with her today. Integumentary (Hair, Skin) Wound #1 status is Healed - Epithelialized. Original cause of wound was Blister. The date acquired was: 06/23/2021. The wound has been in treatment 2 weeks. The wound is located on the Left,Anterior Lower Leg. The wound measures 0cm length x 0cm width x 0cm depth; 0cm^2 area and 0cm^3 volume. Wound #2 status is Healed - Epithelialized. Original cause of wound was Blister. The date acquired was: 06/23/2021. The wound has been in treatment 2 weeks. The wound is located on the Right,Medial Lower Leg. The wound measures 0cm length x 0cm width x 0cm depth; 0cm^2 area and 0cm^3 volume. Assessment Active Problems ICD-10 Lymphedema, not elsewhere classified Venous insufficiency (chronic) (peripheral) Non-pressure chronic ulcer of other part of left lower leg with fat layer exposed Non-pressure chronic ulcer of other part of right lower leg with fat layer exposed Type 2 diabetes mellitus with other skin ulcer Other specified peripheral vascular diseases Essential (primary) hypertension Long term (current) use of anticoagulants Plan Discharge From [facility] Services: Discharge from [place] - No further follow up needed, call if any issues. Edema Control - Lymphedema / SCD / Other: Other Edema Control Orders/Instructions: - JuxtaLite Compression Wraps to both legs: apply first thing in morning and remove at bedtime. Additional Orders / Instructions: Other: - Apply moisturizing lotion to legs in evening after removing JuxtaLites 1. Would recommend currently that we go ahead and initiate a continuation of treatment with the compression and she does have a juxta lites at this time for that which is perfect. 2. I am also can recommend that we have the patient continue to monitor for any signs of worsening or infection. I will see if she has any openings or draining areas she should let me know soon as possible. We will see the  patient back for follow-up visit as needed. Electronic Signature(s) Signed: 08/19/2021 9:51:37 AM By: Worthy Keeler PA-C Entered By: Worthy Keeler on 08/19/2021 09:51:37 -------------------------------------------------------------------------------- SuperBill Details Patient Name: Date of Service: [name] A. 08/19/2021 Medical Record Number: [id number] Patient Account Number: [id number] Date of Birth/Sex: Treating RN: 08-29-1951 (70 y.o. Elam Dutch Primary Care Provider: [name] Other Clinician: Referring Provider: Treating Provider/Extender: [name], [name] in Treatment: 2 Diagnosis Coding ICD-10 Codes Code Description I89.0 Lymphedema,  not elsewhere classified I87.2 Venous insufficiency (chronic) (peripheral) L97.822 Non-pressure chronic ulcer of other part of left lower leg with fat layer exposed L97.812 Non-pressure chronic ulcer of other part of right lower leg with fat layer exposed E11.622 Type 2 diabetes mellitus with other skin ulcer I73.89 Other specified peripheral vascular diseases I10 Essential (primary) hypertension Z79.01 Long term (current) use of anticoagulants Facility Procedures CPT4 Code: [id number] Description: [phone] - WOUND CARE VISIT-LEV 2 EST PT Modifier: Quantity: 1 Physician Procedures : CPT4 Code Description Modifier [id number] - WC PHYS LEVEL 3 - EST PT ICD-10 Diagnosis Description I89.0 Lymphedema, not elsewhere classified I87.2 Venous insufficiency (chronic) (peripheral) L97.822 Non-pressure chronic ulcer of other part of left  lower leg with fat layer exposed L97.812 Non-pressure chronic ulcer of other part of right lower leg with fat layer exposed Quantity: 1 Electronic Signature(s) Signed: 08/19/2021 4:59:20 PM By: Lorrin Jackson Signed: 08/19/2021 5:33:12 PM By: Worthy Keeler PA-C Previous Signature: 08/19/2021 9:52:02 AM Version By: Worthy Keeler PA-C Entered By: Lorrin Jackson on 08/19/2021 10:09:05

## 2021-09-02 ENCOUNTER — Ambulatory Visit (INDEPENDENT_AMBULATORY_CARE_PROVIDER_SITE_OTHER): Payer: HMO

## 2021-09-02 ENCOUNTER — Other Ambulatory Visit: Payer: Self-pay

## 2021-09-02 DIAGNOSIS — I480 Paroxysmal atrial fibrillation: Secondary | ICD-10-CM | POA: Diagnosis not present

## 2021-09-02 DIAGNOSIS — Z5181 Encounter for therapeutic drug level monitoring: Secondary | ICD-10-CM

## 2021-09-02 DIAGNOSIS — Z7901 Long term (current) use of anticoagulants: Secondary | ICD-10-CM | POA: Diagnosis not present

## 2021-09-02 LAB — POCT INR: INR: 1.7 — AB (ref 2.0–3.0)

## 2021-09-02 NOTE — Patient Instructions (Signed)
-  Take 1 tablet of warfarin today  -Then continue to take warfarin 1/2 a tablet daily except for 1 tablet on Tuesday and Thursdays. Recheck INR in 3 weeks. [facility] [phone]

## 2021-09-04 ENCOUNTER — Encounter (HOSPITAL_BASED_OUTPATIENT_CLINIC_OR_DEPARTMENT_OTHER): Payer: HMO | Admitting: Internal Medicine

## 2021-09-07 ENCOUNTER — Encounter: Payer: HMO | Admitting: Vascular Surgery

## 2021-09-07 ENCOUNTER — Encounter (HOSPITAL_COMMUNITY): Payer: HMO

## 2021-09-23 ENCOUNTER — Other Ambulatory Visit: Payer: Self-pay

## 2021-09-23 ENCOUNTER — Ambulatory Visit (INDEPENDENT_AMBULATORY_CARE_PROVIDER_SITE_OTHER): Payer: HMO

## 2021-09-23 DIAGNOSIS — Z7901 Long term (current) use of anticoagulants: Secondary | ICD-10-CM | POA: Diagnosis not present

## 2021-09-23 DIAGNOSIS — I480 Paroxysmal atrial fibrillation: Secondary | ICD-10-CM

## 2021-09-23 LAB — POCT INR: INR: 1.6 — AB (ref 2.0–3.0)

## 2021-09-23 NOTE — Patient Instructions (Signed)
Description   -Take 1 tablet of warfarin today  -Then START taking to take warfarin 1/2 a tablet daily except for 1 tablet on Sundays, Tuesdays and Thursdays. Recheck INR in 2 weeks. [facility] [phone]

## 2021-09-25 ENCOUNTER — Ambulatory Visit (HOSPITAL_COMMUNITY): Payer: HMO

## 2021-09-25 ENCOUNTER — Encounter: Payer: HMO | Admitting: Vascular Surgery

## 2021-09-29 DIAGNOSIS — H43813 Vitreous degeneration, bilateral: Secondary | ICD-10-CM | POA: Diagnosis not present

## 2021-09-29 DIAGNOSIS — H26491 Other secondary cataract, right eye: Secondary | ICD-10-CM | POA: Diagnosis not present

## 2021-09-29 DIAGNOSIS — H524 Presbyopia: Secondary | ICD-10-CM | POA: Diagnosis not present

## 2021-09-29 DIAGNOSIS — E113393 Type 2 diabetes mellitus with moderate nonproliferative diabetic retinopathy without macular edema, bilateral: Secondary | ICD-10-CM | POA: Diagnosis not present

## 2021-09-30 DIAGNOSIS — R5383 Other fatigue: Secondary | ICD-10-CM | POA: Diagnosis not present

## 2021-09-30 DIAGNOSIS — I10 Essential (primary) hypertension: Secondary | ICD-10-CM | POA: Diagnosis not present

## 2021-09-30 DIAGNOSIS — E1065 Type 1 diabetes mellitus with hyperglycemia: Secondary | ICD-10-CM | POA: Diagnosis not present

## 2021-09-30 DIAGNOSIS — Z0000 Encounter for general adult medical examination without abnormal findings: Secondary | ICD-10-CM | POA: Diagnosis not present

## 2021-09-30 DIAGNOSIS — E782 Mixed hyperlipidemia: Secondary | ICD-10-CM | POA: Diagnosis not present

## 2021-10-01 ENCOUNTER — Telehealth: Payer: Self-pay | Admitting: Cardiovascular Disease

## 2021-10-01 NOTE — Telephone Encounter (Signed)
°*  STAT* If patient is at the pharmacy, call can be transferred to refill team.   1. Which medications need to be refilled? (please list name of each medication and dose if known) metoprolol tartrate (LOPRESSOR) 25 MG tablet  2. Which pharmacy/location (including street and city if local pharmacy) is medication to be sent to?[facility] ([place] - [place], [place]  3. Do they need a 30 day or 90 day supply? 90 ds

## 2021-10-07 ENCOUNTER — Other Ambulatory Visit: Payer: Self-pay

## 2021-10-07 ENCOUNTER — Ambulatory Visit (INDEPENDENT_AMBULATORY_CARE_PROVIDER_SITE_OTHER): Payer: HMO

## 2021-10-07 ENCOUNTER — Telehealth: Payer: Self-pay | Admitting: Cardiovascular Disease

## 2021-10-07 DIAGNOSIS — Z0000 Encounter for general adult medical examination without abnormal findings: Secondary | ICD-10-CM | POA: Diagnosis not present

## 2021-10-07 DIAGNOSIS — Z7901 Long term (current) use of anticoagulants: Secondary | ICD-10-CM | POA: Diagnosis not present

## 2021-10-07 DIAGNOSIS — I129 Hypertensive chronic kidney disease with stage 1 through stage 4 chronic kidney disease, or unspecified chronic kidney disease: Secondary | ICD-10-CM | POA: Diagnosis not present

## 2021-10-07 DIAGNOSIS — E10319 Type 1 diabetes mellitus with unspecified diabetic retinopathy without macular edema: Secondary | ICD-10-CM | POA: Diagnosis not present

## 2021-10-07 DIAGNOSIS — D692 Other nonthrombocytopenic purpura: Secondary | ICD-10-CM | POA: Diagnosis not present

## 2021-10-07 DIAGNOSIS — Z5181 Encounter for therapeutic drug level monitoring: Secondary | ICD-10-CM

## 2021-10-07 DIAGNOSIS — I480 Paroxysmal atrial fibrillation: Secondary | ICD-10-CM | POA: Diagnosis not present

## 2021-10-07 DIAGNOSIS — I130 Hypertensive heart and chronic kidney disease with heart failure and stage 1 through stage 4 chronic kidney disease, or unspecified chronic kidney disease: Secondary | ICD-10-CM | POA: Diagnosis not present

## 2021-10-07 DIAGNOSIS — N1831 Chronic kidney disease, stage 3a: Secondary | ICD-10-CM | POA: Diagnosis not present

## 2021-10-07 DIAGNOSIS — D6869 Other thrombophilia: Secondary | ICD-10-CM | POA: Diagnosis not present

## 2021-10-07 DIAGNOSIS — I700 Atherosclerosis of aorta: Secondary | ICD-10-CM | POA: Diagnosis not present

## 2021-10-07 DIAGNOSIS — I2510 Atherosclerotic heart disease of native coronary artery without angina pectoris: Secondary | ICD-10-CM | POA: Diagnosis not present

## 2021-10-07 DIAGNOSIS — I4821 Permanent atrial fibrillation: Secondary | ICD-10-CM | POA: Diagnosis not present

## 2021-10-07 DIAGNOSIS — I10 Essential (primary) hypertension: Secondary | ICD-10-CM | POA: Diagnosis not present

## 2021-10-07 LAB — POCT INR: INR: 1.7 — AB (ref 2.0–3.0)

## 2021-10-07 MED ORDER — METOPROLOL SUCCINATE ER 50 MG PO TB24: 50.0000 mg | ORAL_TABLET | Freq: Every day | ORAL | 3 refills | Status: DC

## 2021-10-07 NOTE — Patient Instructions (Signed)
-  Take 1 tablet of warfarin tonight  -Then START taking 1  tablet daily except for 0.5 tablet on Monday and Friday. Recheck INR in 2 weeks. [facility] [phone]

## 2021-10-07 NOTE — Telephone Encounter (Signed)
° ° °*  STAT* If patient is at the pharmacy, call can be transferred to refill team.   1. Which medications need to be refilled? (please list name of each medication and dose if known) metoprolol succinate (TOPROL XL) 50 MG 24 hr tablet  2. Which pharmacy/location (including street and city if local pharmacy) is medication to be sent to? [id number] - [place], [place] - [place]  3. Do they need a 30 day or 90 day supply? 30 days  Pt is running our of meds, she requested 30 days to send to her local pharmacy the 60 days supplies to [facility] ([place], [place]

## 2021-10-08 ENCOUNTER — Telehealth: Payer: Self-pay | Admitting: Cardiovascular Disease

## 2021-10-08 MED ORDER — METOPROLOL SUCCINATE ER 50 MG PO TB24: 50.0000 mg | ORAL_TABLET | Freq: Every day | ORAL | 0 refills | Status: DC

## 2021-10-08 NOTE — Telephone Encounter (Signed)
Melissa from [facility] states they were told the patient takes 2 tablets daily and so they will need 180 tablets for a 90 day supply. Phone: [phone] Ref # [id number]

## 2021-10-08 NOTE — Telephone Encounter (Signed)
Pt c/o medication issue:  1. Name of Medication: metoprolol tartrate (LOPRESSOR) 25 MG tablet  2. How are you currently taking this medication (dosage and times per day)? Take 1 table as needed for heart rate above 120bpm.  3. Are you having a reaction (difficulty breathing--STAT)? no  4. What is your medication issue? Calling to verify dosage and verbal change

## 2021-10-08 NOTE — Telephone Encounter (Signed)
Spoke with Jerene Canny and clarified metoprolol tartrate order that was sent to a local pharmacy and the metoprolol succinate order that was sent to [facility] (ref # [id number]).

## 2021-10-08 NOTE — Telephone Encounter (Signed)
Spoke to [place] advised patient takes Toprol XL 50 mg daily.90 day refill phoned in. Spoke to patient she stated she will need Toprol XL 50  30 day [facility] sent in.

## 2021-10-21 ENCOUNTER — Other Ambulatory Visit: Payer: Self-pay

## 2021-10-21 ENCOUNTER — Ambulatory Visit (INDEPENDENT_AMBULATORY_CARE_PROVIDER_SITE_OTHER): Payer: HMO

## 2021-10-21 DIAGNOSIS — I480 Paroxysmal atrial fibrillation: Secondary | ICD-10-CM | POA: Diagnosis not present

## 2021-10-21 DIAGNOSIS — Z7901 Long term (current) use of anticoagulants: Secondary | ICD-10-CM

## 2021-10-21 DIAGNOSIS — Z5181 Encounter for therapeutic drug level monitoring: Secondary | ICD-10-CM | POA: Diagnosis not present

## 2021-10-21 LAB — POCT INR: INR: 1.9 — AB (ref 2.0–3.0)

## 2021-10-21 NOTE — Progress Notes (Signed)
Office Note     CC:  Nonhealing BLE ulcerations Requesting Provider:  [name], MD  HPI: [name] is a 71 y.o. (1951-09-22) female with history of CAD, PAD, chronic diastolic CHF, paroxysmal atrial fibrillation, type 2 diabetes on insulin, and morbid obesity presenting at the request of .[name], MD for bilateral lower extremity ulcerations on her calves.  [place] native, and graduate of [employer], [name] worked in Scientist, research (medical) for years.  She is now retired.  Over the last year, her health is declined quite a bit.  She is now spending the majority of her time in a wheelchair due to morbid obesity with a BMI of 50.  Over the last several months, she is appreciated thin skin on her bilateral calves.  The wounds that develop heel, however has intermittent pain.  He spends most of her day in her wheelchair, watching TV in the dependent position.  She has been followed in the wound care center for her bilateral lower extremity wounds.  Terica denies history of claudication, rest pain, tissue loss at the toes.  The pt is  on a statin for cholesterol management.  The pt is not on a daily aspirin.   Other AC:  warfarin The pt is  on medication for hypertension.   The pt is  diabetic.  Tobacco hx:  former  Past Medical History:  Diagnosis Date   Arthritis    "knees" (02/05/2016)   Cancer ([place]) 5/10   LUL Vats    CHF (congestive heart failure) (HCC)    Coronary artery disease    GERD (gastroesophageal reflux disease)    tx meds   H/O hiatal hernia    History of blood transfusion "several"   last april 2015 s/p knee surgery (02/05/2016)   Hypercholesteremia    Hypertension    Lung cancer ([place])    2010, surgery 18% left lung no radiation or chemo   Non-STEMI (non-ST elevated myocardial infarction) ([place]) 04/07/2012   See cath results below   Paroxysmal atrial fibrillation ([place]) 04/07/2012   On Warfarin   Peripheral vascular disease ([place]) 8/12   Lt SFA PTA    Presence of stent in LAD coronary artery 04/07/2012   Xience [employer]ition DES 2.75 mm x 18 mm (dilated to 3.0 mm)   Sleep apnea    does not wear CPAP   Type II diabetes mellitus (HCC)    insulin dependent    Past Surgical History:  Procedure Laterality Date   ABDOMINAL HYSTERECTOMY  1990   BREAST EXCISIONAL BIOPSY Right    CARDIAC CATHETERIZATION  11/04/2008   patent RCA, LM, and Circ, nl EF   CARDIAC CATHETERIZATION  02/20/2002   patent coronaries with the only abnormality being a smooth luminla irregularity in the mid intermediate ramus branch no felt to be hemodynamically significant, nl LV   CARDIAC CATHETERIZATION N/A 02/05/2016   Procedure: Left Heart Cath and Coronary Angiography;  Surgeon: Leonie Man, MD;  Location: [facility];  Service: Cardiovascular;  Laterality: N/A;   CARDIAC CATHETERIZATION N/A 02/05/2016   Procedure: Coronary Stent Intervention;  Surgeon: Leonie Man, MD;  Location: [facility];  Service: Cardiovascular;  Laterality: N/A;   CARDIOVERSION N/A 07/16/2021   Procedure: CARDIOVERSION;  Surgeon: Werner Lean, MD;  Location: [facility];  Service: Cardiovascular;  Laterality: N/A;   CATARACT EXTRACTION W/ INTRAOCULAR LENS  IMPLANT, BILATERAL  2013   CORONARY ANGIOPLASTY WITH STENT PLACEMENT  04/07/2012   Xience [employer]  DES 2.35m x 18 mm (dilated to 3.0 mm) to the prox LAD    CORONARY ANGIOPLASTY WITH STENT PLACEMENT  2013; 2015   CORONARY ARTERY BYPASS GRAFT N/A 08/17/2017   Procedure: CORONARY ARTERY BYPASS GRAFTING (CABG) TIMES 1 USING LEFT INTERNAL MAMMARY ARTERY;  Surgeon: HMelrose Nakayama MD;  [facility]  Service: Open Heart Surgery;  Laterality: N/A;   DILATION AND CURETTAGE OF UTERUS  <1990   ESOPHAGOGASTRODUODENOSCOPY N/A 05/23/2015   Procedure: ESOPHAGOGASTRODUODENOSCOPY (EGD);  Surgeon: PCarol Ada MD;  [facility];  Service: Endoscopy;  Laterality: N/A;   ESOPHAGOGASTRODUODENOSCOPY (EGD) WITH  PROPOFOL N/A 06/13/2015   Procedure: ESOPHAGOGASTRODUODENOSCOPY (EGD) WITH PROPOFOL;  Surgeon: PCarol Ada MD;  [facility];  Service: Endoscopy;  Laterality: N/A;   ESOPHAGOGASTRODUODENOSCOPY (EGD) WITH PROPOFOL N/A 04/07/2018   Procedure: ESOPHAGOGASTRODUODENOSCOPY (EGD) WITH PROPOFOL;  Surgeon: HCarol Ada MD;  [facility];  Service: Endoscopy;  Laterality: N/A;  fluoroscopy is needed   FRACTURE SURGERY     HAMMER TOE SURGERY Bilateral 1993   with screws, on screw removed   JOINT REPLACEMENT     KNEE ARTHROSCOPY Bilateral    left x2, right x1   LAPAROSCOPIC CHOLECYSTECTOMY     LEFT HEART CATH AND CORONARY ANGIOGRAPHY N/A 08/11/2017   Procedure: LEFT HEART CATH AND CORONARY ANGIOGRAPHY;  Surgeon: JMartinique Peter M, MD;  [facility];  Service: Cardiovascular;  Laterality: N/A;   LEFT HEART CATHETERIZATION WITH CORONARY ANGIOGRAM N/A 04/07/2012   Procedure: LEFT HEART CATHETERIZATION WITH CORONARY ANGIOGRAM;  Surgeon: JLorretta Harp MD;  [facility];  Service: Cardiovascular;  Laterality: N/A;   LEFT HEART CATHETERIZATION WITH CORONARY ANGIOGRAM N/A 05/27/2014   Procedure: LEFT HEART CATHETERIZATION WITH CORONARY ANGIOGRAM;  Surgeon: JLorretta Harp MD; LAD 99% ISR, CFX 50-60%, RCA (dominant) no sig dz, EF 60%   LOWER EXTREMITY ANGIOGRAM  05/17/11   directional atherectomy to the prox L SFA using a LX Man TurboHawk, ballooned with a [place] balloon    ORIF ANKLE FRACTURE  07/09/2012   Procedure: OPEN REDUCTION INTERNAL FIXATION (ORIF) ANKLE FRACTURE;  Surgeon: GMeredith Pel MD;  [facility];  Service: Orthopedics;  Laterality: Left;  open reduction internal fixation trimalleolar ankle fracture medial malleolous fixation   PERCUTANEOUS CORONARY STENT INTERVENTION (PCI-S) N/A 04/07/2012   Procedure: PERCUTANEOUS CORONARY STENT INTERVENTION (PCI-S);  Surgeon: JLorretta Harp MD;  [facility];  Service: Cardiovascular;   Laterality: N/A;   PERCUTANEOUS CORONARY STENT INTERVENTION (PCI-S)  05/27/2014   Procedure: PERCUTANEOUS CORONARY STENT INTERVENTION (PCI-S);  Surgeon: JLorretta Harp MD; 3 mm x 12 mm long Xience Xpedition DES to the proximal LAD   SAVORY DILATION N/A 06/13/2015   Procedure: SAVORY DILATION;  Surgeon: PCarol Ada MD;  [facility];  Service: Endoscopy;  Laterality: N/A;   SAVORY DILATION N/A 04/07/2018   Procedure: SAVORY DILATION;  Surgeon: HCarol Ada MD;  [facility];  Service: Endoscopy;  Laterality: N/A;   TEE WITHOUT CARDIOVERSION N/A 08/17/2017   Procedure: TRANSESOPHAGEAL ECHOCARDIOGRAM (TEE);  Surgeon: HMelrose Nakayama MD;  [facility]  Service: Open Heart Surgery;  Laterality: N/A;   TEE WITHOUT CARDIOVERSION N/A 07/16/2021   Procedure: TRANSESOPHAGEAL ECHOCARDIOGRAM (TEE);  Surgeon: CWerner Lean MD;  [facility];  Service: Cardiovascular;  Laterality: N/A;   TONSILLECTOMY     TOTAL KNEE ARTHROPLASTY Left 2003   TOTAL KNEE REVISION Left 11/05/2013   Procedure: LEFT TOTAL KNEE RESECTION;  Surgeon: Mauri Pole, MD;  [facility];  Service: Orthopedics;  Laterality: Left;   TOTAL KNEE REVISION Left 2007   opened in 2006 and cleaned out, 2007 revision   TOTAL KNEE REVISION Left 12/31/2013   Procedure: RE-INPLANTATION LEFT TOTAL KNEE ;  Surgeon: Mauri Pole, MD;  [facility];  Service: Orthopedics;  Laterality: Left;   VIDEO ASSISTED THORACOSCOPY (VATS)/ LOBECTOMY  01/2009    Social History   Socioeconomic History   Marital status: Single    Spouse name: Not on file   Number of children: Not on file   Years of education: Not on file   Highest education level: Not on file  Occupational History   Occupation: Retired  Tobacco Use   Smoking status: Former    Packs/day: 1.00    Years: 20.00    Pack years: 20.00    Types: Cigarettes    Quit date: 09/27/1984    Years since quitting: 37.0   Smokeless tobacco:  Never  Vaping Use   Vaping Use: Never used  Substance and Sexual Activity   Alcohol use: No   Drug use: No   Sexual activity: Not on file  Other Topics Concern   Not on file  Social History Narrative   Patient lives alone.   Social Determinants of Health   Financial Resource Strain: Not on file  Food Insecurity: Not on file  Transportation Needs: Not on file  Physical Activity: Not on file  Stress: Not on file  Social Connections: Not on file  Intimate Partner Violence: Not on file    Family History  Problem Relation Age of Onset   Hypertension Mother    Coronary artery disease Brother    Hypertension Sister     Current Outpatient Medications  Medication Sig Dispense Refill   Blood Glucose Monitoring Suppl ([place]) w/Device KIT      Continuous Blood Gluc Receiver (FREESTYLE LIBRE 2 READER) DEVI See admin instructions.     furosemide (LASIX) 40 MG tablet Take 1 tablet (40 mg total) by mouth 3 (three) times a week. TAKE Monday Wednesday AND FRIDAY 36 tablet 3   insulin NPH-regular Human (70-30) 100 UNIT/ML injection Inject 20-62 Units into the skin 2 (two) times daily with a meal. 62 units in the morning and 20 units at bedtme     Lancets (ONETOUCH DELICA PLUS [id number]) MISC Apply topically.     lisinopril-hydrochlorothiazide (PRINZIDE,ZESTORETIC) 20-25 MG per tablet Take 1 tablet by mouth daily.     metoCLOPramide (REGLAN) 10 MG tablet Take 10 mg by mouth 2 (two) times daily. Per patient     metoprolol succinate (TOPROL XL) 50 MG 24 hr tablet Take 1 tablet (50 mg total) by mouth daily. 30 tablet 0   metoprolol tartrate (LOPRESSOR) 25 MG tablet Take 1 table as needed for heart rate above 120bpm. 30 tablet 6   ONETOUCH VERIO test strip 1 each 2 (two) times daily.     pantoprazole (PROTONIX) 40 MG tablet Take 1 tablet (40 mg total) by mouth daily. 90 tablet 3   simvastatin (ZOCOR) 40 MG tablet TAKE 1 TABLET BY MOUTH DAILY AT 6 PM. 30 tablet 3    spironolactone (ALDACTONE) 25 MG tablet Take 1 tablet (25 mg total) by mouth 3 (three) times a week. TAKE Monday Wednesday AND Friday 36 tablet 3   Vitamin D, Cholecalciferol, 1000 units TABS Take 1,000 Units by mouth every morning.      warfarin (COUMADIN) 7.5  MG tablet TAKE 1/2 TO 1 TABLET DAILY AS DIRECTED BY COUMADIN CLINIC (Patient taking differently: Take 3.75-7.5 mg by mouth as directed. TAKE 1 TABLET (7.5 MG) ON (TUES & THURS) & TAKE 1/2 TABLET (3.75 MG) ALL OTHER DAYS AS DIRECTED BY COUMADIN CLINIC) 90 tablet 0   No current facility-administered medications for this visit.    Allergies  Allergen Reactions   Levofloxacin Itching   Morphine And Related Itching    Pt reports oxycodone history with no allergy.     REVIEW OF SYSTEMS:   [X]  denotes positive finding, [ ]  denotes negative finding Cardiac  Comments:  Chest pain or chest pressure:    Shortness of breath upon exertion:    Short of breath when lying flat:    Irregular heart rhythm:        Vascular    Pain in calf, thigh, or hip brought on by ambulation:    Pain in feet at night that wakes you up from your sleep:     Blood clot in your veins:    Leg swelling:         Pulmonary    Oxygen at home:    Productive cough:     Wheezing:         Neurologic    Sudden weakness in arms or legs:     Sudden numbness in arms or legs:     Sudden onset of difficulty speaking or slurred speech:    Temporary loss of vision in one eye:     Problems with dizziness:         Gastrointestinal    Blood in stool:     Vomited blood:         Genitourinary    Burning when urinating:     Blood in urine:        Psychiatric    Major depression:         Hematologic    Bleeding problems:    Problems with blood clotting too easily:        Skin    Rashes or ulcers:        Constitutional    Fever or chills:      PHYSICAL EXAMINATION:  There were no vitals filed for this visit.  General:  WDWN in NAD; vital signs  documented above Gait: Not observed HENT: WNL, normocephalic Pulmonary: normal non-labored breathing , without wheezing Cardiac: regular HR, Abdomen: soft, NT, no masses Skin: without rashes, lipodermatosclerosis appreciated on bilateral lower extremities Vascular Exam/Pulses:  Right Left  Radial 2+ (normal) 2+ (normal)  Ulnar 2+ (normal) 2+ (normal)  Femoral    Popliteal    DP absent absent  PT absent absent   Extremities: without ischemic changes, without Gangrene , without cellulitis; with open wounds small, punctate, 3 mm, superficial Musculoskeletal: no muscle wasting or atrophy  Neurologic: A&O X 3;  No focal weakness or paresthesias are detected Psychiatric:  The pt has Normal affect.   Non-Invasive Vascular Imaging:   ABI Findings:  +---------+------------------+-----+----------+--------+   Right     Rt Pressure (mmHg) Index Waveform   Comment    +---------+------------------+-----+----------+--------+   Brachial  174                                            +---------+------------------+-----+----------+--------+   PTA       255  1.39  monophasic            +---------+------------------+-----+----------+--------+   DP        255                1.39  monophasic            +---------+------------------+-----+----------+--------+   Great Toe 80                 0.44                        +---------+------------------+-----+----------+--------+   +---------+------------------+-----+----------+-------+   Left      Lt Pressure (mmHg) Index Waveform   Comment   +---------+------------------+-----+----------+-------+   Brachial  183                                           +---------+------------------+-----+----------+-------+   PTA       255                1.39  monophasic           +---------+------------------+-----+----------+-------+   DP        255                1.39  monophasic           +---------+------------------+-----+----------+-------+    Great Toe 80                 0.44                       +---------+------------------+-----+----------+-------+      ASSESSMENT/PLAN: AKIYAH EPPOLITO is a nonambulatory 71 y.o. female presenting with bilateral lower extremity heaviness and discoloration in the calves consistent with bilateral lower extremity chronic venous insufficiency.  ABIs were reviewed, and while there is peripheral arterial disease, the toe pressure is sufficient to heal wounds. There is also an element of lipedema.  There was a small 3 x 3 mm wound appreciated, however it was healing appropriately.  She has not had an issue with wounds healing up to this point, and was more concerned about the paraesthesias in the feet which is likely neuropathy.  The majority of her ulcerations occur from picking at dead skin that develops on her calves.  She is unable to reach her feet for necessary hygiene due to her obesity.  With mixed arterial venous disease, and lipedema from morbid obesity, Louellen would be best served with elevation and compression.  She has compression stockings that were given to her by the wound care center, but due to her current size she is having difficulty putting them on.  She lives with her son, who works third shift. She is going to work on coordinating placing her stockings on when he arrives home from work, and then removing prior to him leaving.  We also discussed that she should spend more time in a recliner than in the dependent position of her wheelchair, as this should help with some of the swelling.  Unfortunately she has lipodermatosclerosis and therefore there is a component of her lower leg swelling that is fibrotic and will not improve.  I had a long discussion with Cordie regarding follow-up in my clinic.  I told her I am happy to see her in 3 months for further assessment, however he could  also follow-up with me should new, nonhealing ulcerations occur.  Manreet chose to follow-up as  needed  Broadus John, MD [facility] [phone]

## 2021-10-21 NOTE — Patient Instructions (Signed)
-  Take 1.5 tablets of warfarin tonight  -Then START taking 1 tablet daily except for 0.5 tablet on Monday. Recheck INR in 4 weeks. [facility]in Clinic [phone]

## 2021-10-23 ENCOUNTER — Ambulatory Visit (HOSPITAL_COMMUNITY)
Admission: RE | Admit: 2021-10-23 | Discharge: 2021-10-23 | Disposition: A | Discharge status: Home / Self Care | Payer: HMO | Source: Ambulatory Visit | Attending: Vascular Surgery | Admitting: Vascular Surgery

## 2021-10-23 ENCOUNTER — Encounter: Payer: Self-pay | Admitting: Vascular Surgery

## 2021-10-23 ENCOUNTER — Other Ambulatory Visit: Payer: Self-pay

## 2021-10-23 ENCOUNTER — Ambulatory Visit: Payer: HMO | Admitting: Vascular Surgery

## 2021-10-23 VITALS — BP 134/70 | HR 70 | Temp 97.7°F | Resp 20 | Ht 63.0 in | Wt 282.0 lb

## 2021-10-23 DIAGNOSIS — I739 Peripheral vascular disease, unspecified: Secondary | ICD-10-CM | POA: Diagnosis not present

## 2021-10-23 DIAGNOSIS — I872 Venous insufficiency (chronic) (peripheral): Secondary | ICD-10-CM

## 2021-11-03 ENCOUNTER — Other Ambulatory Visit: Payer: Self-pay | Admitting: Cardiovascular Disease

## 2021-11-18 ENCOUNTER — Other Ambulatory Visit: Payer: Self-pay

## 2021-11-18 ENCOUNTER — Ambulatory Visit (INDEPENDENT_AMBULATORY_CARE_PROVIDER_SITE_OTHER): Payer: HMO

## 2021-11-18 DIAGNOSIS — Z5181 Encounter for therapeutic drug level monitoring: Secondary | ICD-10-CM | POA: Diagnosis not present

## 2021-11-18 DIAGNOSIS — I480 Paroxysmal atrial fibrillation: Secondary | ICD-10-CM

## 2021-11-18 DIAGNOSIS — Z7901 Long term (current) use of anticoagulants: Secondary | ICD-10-CM

## 2021-11-18 LAB — POCT INR: INR: 2.5 (ref 2.0–3.0)

## 2021-11-18 NOTE — Patient Instructions (Signed)
Continue 1 tablet daily except for 0.5 tablet on Monday. Recheck INR in 6 weeks. Coumadin Clinic [phone]

## 2021-11-24 DIAGNOSIS — E1165 Type 2 diabetes mellitus with hyperglycemia: Secondary | ICD-10-CM | POA: Diagnosis not present

## 2021-11-24 DIAGNOSIS — I129 Hypertensive chronic kidney disease with stage 1 through stage 4 chronic kidney disease, or unspecified chronic kidney disease: Secondary | ICD-10-CM | POA: Diagnosis not present

## 2021-11-24 DIAGNOSIS — E1169 Type 2 diabetes mellitus with other specified complication: Secondary | ICD-10-CM | POA: Diagnosis not present

## 2021-11-24 DIAGNOSIS — Z87891 Personal history of nicotine dependence: Secondary | ICD-10-CM | POA: Diagnosis not present

## 2021-11-24 DIAGNOSIS — I700 Atherosclerosis of aorta: Secondary | ICD-10-CM | POA: Diagnosis not present

## 2021-11-24 DIAGNOSIS — N1832 Chronic kidney disease, stage 3b: Secondary | ICD-10-CM | POA: Diagnosis not present

## 2021-11-24 DIAGNOSIS — Z794 Long term (current) use of insulin: Secondary | ICD-10-CM | POA: Diagnosis not present

## 2021-11-24 DIAGNOSIS — Z6841 Body Mass Index (BMI) 40.0 and over, adult: Secondary | ICD-10-CM | POA: Diagnosis not present

## 2021-11-24 DIAGNOSIS — E1122 Type 2 diabetes mellitus with diabetic chronic kidney disease: Secondary | ICD-10-CM | POA: Diagnosis not present

## 2021-12-04 ENCOUNTER — Ambulatory Visit (INDEPENDENT_AMBULATORY_CARE_PROVIDER_SITE_OTHER): Payer: HMO | Admitting: Cardiovascular Disease

## 2021-12-04 ENCOUNTER — Other Ambulatory Visit: Payer: Self-pay

## 2021-12-04 ENCOUNTER — Encounter: Payer: Self-pay | Admitting: Cardiovascular Disease

## 2021-12-04 VITALS — BP 114/60 | HR 77 | Ht 63.5 in | Wt 281.0 lb

## 2021-12-04 DIAGNOSIS — I5032 Chronic diastolic (congestive) heart failure: Secondary | ICD-10-CM | POA: Diagnosis not present

## 2021-12-04 DIAGNOSIS — E1151 Type 2 diabetes mellitus with diabetic peripheral angiopathy without gangrene: Secondary | ICD-10-CM

## 2021-12-04 DIAGNOSIS — I10 Essential (primary) hypertension: Secondary | ICD-10-CM | POA: Diagnosis not present

## 2021-12-04 DIAGNOSIS — I2510 Atherosclerotic heart disease of native coronary artery without angina pectoris: Secondary | ICD-10-CM

## 2021-12-04 DIAGNOSIS — I480 Paroxysmal atrial fibrillation: Secondary | ICD-10-CM

## 2021-12-04 DIAGNOSIS — I739 Peripheral vascular disease, unspecified: Secondary | ICD-10-CM

## 2021-12-04 DIAGNOSIS — D6869 Other thrombophilia: Secondary | ICD-10-CM

## 2021-12-04 MED ORDER — WARFARIN SODIUM 7.5 MG PO TABS: ORAL_TABLET | ORAL | 0 refills | Status: DC

## 2021-12-04 NOTE — Progress Notes (Signed)
Cardiology Office note   Patient Name: [name] Date of Encounter: 12/04/2021  Primary Care Provider:  [name], MD Primary Cardiologist:  Sanda Klein, MD  Patient Profile    [name] is a 71 y.o. female who presents for a follow-up evaluation of her CHF, CAD and atrial fibrillation  Past Medical History    Past Medical History:  Diagnosis Date   Arthritis    "knees" (02/05/2016)   Cancer ([place]) 5/10   LUL Vats    CHF (congestive heart failure) ([place])    Coronary artery disease    GERD (gastroesophageal reflux disease)    tx meds   H/O hiatal hernia    History of blood transfusion "several"   last april 2015 s/p knee surgery (02/05/2016)   Hypercholesteremia    Hypertension    Lung cancer ([place])    2010, surgery 18% left lung no radiation or chemo   Non-STEMI (non-ST elevated myocardial infarction) ([place]) 04/07/2012   See cath results below   Paroxysmal atrial fibrillation ([place]) 04/07/2012   On Warfarin   Peripheral vascular disease ([place]) 8/12   Lt SFA PTA   Presence of stent in LAD coronary artery 04/07/2012   Xience Expedition DES 2.75 mm x 18 mm (dilated to 3.0 mm)   Sleep apnea    does not wear CPAP   Type II diabetes mellitus ([place])    insulin dependent   Past Surgical History:  Procedure Laterality Date   ABDOMINAL HYSTERECTOMY  1990   BREAST EXCISIONAL BIOPSY Right    CARDIAC CATHETERIZATION  11/04/2008   patent RCA, LM, and Circ, nl EF   CARDIAC CATHETERIZATION  02/20/2002   patent coronaries with the only abnormality being a smooth luminla irregularity in the mid intermediate ramus branch no felt to be hemodynamically significant, nl LV   CARDIAC CATHETERIZATION N/A 02/05/2016   Procedure: Left Heart Cath and Coronary Angiography;  Surgeon: Leonie Man, MD;  Location: [facility];  Service: Cardiovascular;  Laterality: N/A;   CARDIAC CATHETERIZATION N/A 02/05/2016   Procedure: Coronary Stent Intervention;  Surgeon: Leonie Man, MD;  Location: [facility];  Service: Cardiovascular;  Laterality: N/A;   CARDIOVERSION N/A 07/16/2021   Procedure: CARDIOVERSION;  Surgeon: Werner Lean, MD;  Location: [facility];  Service: Cardiovascular;  Laterality: N/A;   CATARACT EXTRACTION W/ INTRAOCULAR LENS  IMPLANT, BILATERAL  2013   CORONARY ANGIOPLASTY WITH STENT PLACEMENT  04/07/2012   Xience Expedition DES 2.73mm x 18 mm (dilated to 3.0 mm) to the prox LAD    CORONARY ANGIOPLASTY WITH STENT PLACEMENT  2013; 2015   CORONARY ARTERY BYPASS GRAFT N/A 08/17/2017   Procedure: CORONARY ARTERY BYPASS GRAFTING (CABG) TIMES 1 USING LEFT INTERNAL MAMMARY ARTERY;  Surgeon: Melrose Nakayama, MD;  Location: [place] [facility];  Service: Open Heart Surgery;  Laterality: N/A;   DILATION AND CURETTAGE OF UTERUS  <1990   ESOPHAGOGASTRODUODENOSCOPY N/A 05/23/2015   Procedure: ESOPHAGOGASTRODUODENOSCOPY (EGD);  Surgeon: Carol Ada, MD;  Location: [facility];  Service: Endoscopy;  Laterality: N/A;   ESOPHAGOGASTRODUODENOSCOPY (EGD) WITH PROPOFOL N/A 06/13/2015   Procedure: ESOPHAGOGASTRODUODENOSCOPY (EGD) WITH PROPOFOL;  Surgeon: Carol Ada, MD;  Location: [facility];  Service: Endoscopy;  Laterality: N/A;   ESOPHAGOGASTRODUODENOSCOPY (EGD) WITH PROPOFOL N/A 04/07/2018   Procedure: ESOPHAGOGASTRODUODENOSCOPY (EGD) WITH PROPOFOL;  Surgeon: Carol Ada, MD;  Location: [facility];  Service: Endoscopy;  Laterality: N/A;  fluoroscopy is needed   FRACTURE SURGERY     HAMMER  TOE SURGERY Bilateral 1993   with screws, on screw removed   JOINT REPLACEMENT     KNEE ARTHROSCOPY Bilateral    left x2, right x1   LAPAROSCOPIC CHOLECYSTECTOMY     LEFT HEART CATH AND CORONARY ANGIOGRAPHY N/A 08/11/2017   Procedure: LEFT HEART CATH AND CORONARY ANGIOGRAPHY;  Surgeon: Martinique, Peter M, MD;  Location: [facility];  Service: Cardiovascular;  Laterality: N/A;   LEFT HEART CATHETERIZATION WITH CORONARY ANGIOGRAM N/A 04/07/2012    Procedure: LEFT HEART CATHETERIZATION WITH CORONARY ANGIOGRAM;  Surgeon: [name], MD;  Location: [facility];  Service: Cardiovascular;  Laterality: N/A;   LEFT HEART CATHETERIZATION WITH CORONARY ANGIOGRAM N/A 05/27/2014   Procedure: LEFT HEART CATHETERIZATION WITH CORONARY ANGIOGRAM;  Surgeon: [name], MD; LAD 99% ISR, CFX 50-60%, RCA (dominant) no sig dz, EF 60%   LOWER EXTREMITY ANGIOGRAM  05/17/11   directional atherectomy to the prox L SFA using a LX Man TurboHawk, ballooned with a [place] balloon    ORIF ANKLE FRACTURE  07/09/2012   Procedure: OPEN REDUCTION INTERNAL FIXATION (ORIF) ANKLE FRACTURE;  Surgeon: Meredith Pel, MD;  Location: [facility];  Service: Orthopedics;  Laterality: Left;  open reduction internal fixation trimalleolar ankle fracture medial malleolous fixation   PERCUTANEOUS CORONARY STENT INTERVENTION (PCI-S) N/A 04/07/2012   Procedure: PERCUTANEOUS CORONARY STENT INTERVENTION (PCI-S);  Surgeon: [name], MD;  Location: [facility] LAB;  Service: Cardiovascular;  Laterality: N/A;   PERCUTANEOUS CORONARY STENT INTERVENTION (PCI-S)  05/27/2014   Procedure: PERCUTANEOUS CORONARY STENT INTERVENTION (PCI-S);  Surgeon: [name], MD; 3 mm x 12 mm long Xience Xpedition DES to the proximal LAD   SAVORY DILATION N/A 06/13/2015   Procedure: SAVORY DILATION;  Surgeon: Carol Ada, MD;  [facility];  Service: Endoscopy;  Laterality: N/A;   SAVORY DILATION N/A 04/07/2018   Procedure: SAVORY DILATION;  Surgeon: Carol Ada, MD;  [facility];  Service: Endoscopy;  Laterality: N/A;   TEE WITHOUT CARDIOVERSION N/A 08/17/2017   Procedure: TRANSESOPHAGEAL ECHOCARDIOGRAM (TEE);  Surgeon: Melrose Nakayama, MD;  Location: [place] [facility];  Service: Open Heart Surgery;  Laterality: N/A;   TEE WITHOUT CARDIOVERSION N/A 07/16/2021   Procedure: TRANSESOPHAGEAL ECHOCARDIOGRAM (TEE);  Surgeon: Werner Lean, MD;  Location: [facility]COPY;   Service: Cardiovascular;  Laterality: N/A;   TONSILLECTOMY     TOTAL KNEE ARTHROPLASTY Left 2003   TOTAL KNEE REVISION Left 11/05/2013   Procedure: LEFT TOTAL KNEE RESECTION;  Surgeon: Mauri Pole, MD;  Location: [facility];  Service: Orthopedics;  Laterality: Left;   TOTAL KNEE REVISION Left 2007   opened in 2006 and cleaned out, 2007 revision   TOTAL KNEE REVISION Left 12/31/2013   Procedure: RE-INPLANTATION LEFT TOTAL KNEE ;  Surgeon: Mauri Pole, MD;  Location: [facility];  Service: Orthopedics;  Laterality: Left;   VIDEO ASSISTED THORACOSCOPY (VATS)/ LOBECTOMY  01/2009    Allergies  Allergies  Allergen Reactions   Levofloxacin Itching   Morphine And Related Itching    Pt reports oxycodone history with no allergy.    History of Present Illness    Ms. Grupe PMH of CABG (LIMA-LAD 11/18), PAD, chronic diastolic CHF, and paroxysmal atrial fibrillation.  Her PMH also includes type 2 diabetes on insulin, peripheral venous insufficiency, bilateral knee degenerative joint disease and morbid obesity.  She was hospitalized in October 2022 with rapid palpitations and was found to have atrial fibrillation/atypical atrial flutter with rapid ventricular response.  Initially  her warfarin anticoagulation was subtherapeutic and she was managed with rate control.  She eventually underwent TEE guided cardioversion and has successfully maintained sinus rhythm since then.  BNP was elevated that she was discharged on daily doses of furosemide, which we have gradually cut back since.  She is currently taking furosemide and spironolactone 3 days a week only.  INR was staying subtherapeutic for a while recently, she did not realize that coleslaw has vitamin K.  2 weeks ago her INR was therapeutic at 2.5.  She has chronic problems with lower extremity edema and has bilateral venous reflux.  T in the morning she has less edema, although this never completely resolved.  The edema is always worse at the end of the  day.  He does not have orthopnea or PND.  Her weight today is unchanged from January and is about 10 pounds less than it was when she was hospitalized with atrial fibrillation last fall.  She does not have angina at rest at rest or with activity.  She uses a walker around the house and today is in a wheelchair.  She denies palpitations, dizziness, syncope, falls, injuries or bleeding problems.  She has not had any focal neurological events.   She underwent CABG November 2018 for recurrent in-stent restenosis with multiple PCI.  (PCI with DES to LAD 2013, 2015 DES to distal LAD, 4/17 received DES in proximal LAD, 11/18 again with 3 stenosis and was referred for single-vessel CABG).  She is also status post left superficial femoral artery atherectomy.  Hospitalized with symptomatic atrial fibrillation rapid ventricular response in October 2022 when she underwent TEE guided cardioversion.   Home Medications    Prior to Admission medications   Medication Sig Start Date End Date Taking? Authorizing Provider  furosemide (LASIX) 40 MG tablet Take 40 mg by mouth as needed.    [provider]  insulin NPH-regular Human (70-30) 100 UNIT/ML injection Inject into the skin. 60 units in the morning and 20 units at bedtme    [provider]  lisinopril-hydrochlorothiazide (PRINZIDE,ZESTORETIC) 20-25 MG per tablet Take 1 tablet by mouth daily.    [provider]  metoCLOPramide (REGLAN) 10 MG tablet Take 10 mg by mouth 2 (two) times daily. Per patient    [provider]  metoprolol tartrate (LOPRESSOR) 50 MG tablet 50 mg in the morning and 25 mg at bedtime    [provider]  pantoprazole (PROTONIX) 40 MG tablet Take 1 tablet (40 mg total) by mouth daily. 06/13/20   Staphanie Harbison, MD  simvastatin (ZOCOR) 40 MG tablet Take 1 tablet (40 mg total) by mouth daily at 6 PM. 04/01/20   Ermine Spofford, MD  spironolactone (ALDACTONE) 25 MG tablet Take 25 mg by mouth as needed.     [provider]  Vitamin D, Cholecalciferol, 1000 units TABS Take 1,000 Units by mouth every morning.     [provider]  warfarin (COUMADIN) 7.5 MG tablet TAKE 1/2 TO 1 TABLET DAILY AS DIRECTED BY COUMADIN CLINIC 06/13/20   Rether Rison, MD    Family History    Family History  Problem Relation Age of Onset   Hypertension Mother    Coronary artery disease Brother    Hypertension Sister    She indicated that her mother is deceased. She indicated that her father is deceased. She indicated that the status of her sister is unknown. She indicated that the status of her brother is unknown.  Social History  Social History   Socioeconomic History   Marital status: Single    Spouse name: Not on file   Number of children: Not on file   Years of education: Not on file   Highest education level: Not on file  Occupational History   Occupation: Retired  Tobacco Use   Smoking status: Former    Packs/day: 1.00    Years: 20.00    Pack years: 20.00    Types: Cigarettes    Quit date: 09/27/1984    Years since quitting: 37.2   Smokeless tobacco: Never  Vaping Use   Vaping Use: Never used  Substance and Sexual Activity   Alcohol use: No   Drug use: No   Sexual activity: Not on file  Other Topics Concern   Not on file  Social History Narrative   Patient lives alone.   Social Determinants of Health   Financial Resource Strain: Not on file  Food Insecurity: Not on file  Transportation Needs: Not on file  Physical Activity: Not on file  Stress: Not on file  Social Connections: Not on file  Intimate Partner Violence: Not on file     Review of Systems    General:  No chills, fever, night sweats or weight changes.  Cardiovascular:  No chest pain, dyspnea on exertion, edema, orthopnea, palpitations, paroxysmal nocturnal dyspnea. Dermatological: No rash, lesions/masses Respiratory: No cough, dyspnea Urologic: No hematuria, dysuria Abdominal:   No nausea,  vomiting, diarrhea, bright red blood per rectum, melena, or hematemesis Neurologic:  No visual changes, wkns, changes in mental status. All other systems reviewed and are otherwise negative except as noted above.  Physical Exam    VS:  BP 114/60    Pulse 77    Ht 5' 3.5" (1.613 m)    Wt 281 lb (127.5 kg)    SpO2 99%    BMI 49.00 kg/m  , BMI Body mass index is 49 kg/m.   General: Alert, oriented x3, no distress, in a wheelchair.  Morbidly obese Head: no evidence of trauma, PERRL, EOMI, no exophtalmos or lid lag, no myxedema, no xanthelasma; normal ears, nose and oropharynx Neck: normal jugular venous pulsations and no hepatojugular reflux; brisk carotid pulses without delay and no carotid bruits Chest: clear to auscultation, no signs of consolidation by percussion or palpation, normal fremitus, symmetrical and full respiratory excursions Cardiovascular: normal position and quality of the apical impulse, regular rhythm, normal first and split second heart sounds, no murmurs, rubs or gallops Abdomen: no tenderness or distention, no masses by palpation, no abnormal pulsatility or arterial bruits, normal bowel sounds, no hepatosplenomegaly Extremities: no clubbing, cyanosis; symmetrical 2+ ankle and calf edema; 2+ radial, ulnar and brachial pulses bilaterally; 2+ right femoral, posterior tibial and dorsalis pedis pulses; 2+ left femoral, posterior tibial and dorsalis pedis pulses; no subclavian or femoral bruits Neurological: grossly nonfocal Psych: Normal mood and affect  Accessory Clinical Findings    Recent Labs: 07/14/2021: B Natriuretic Peptide 163.5; TSH 2.181 07/15/2021: ALT 26; BUN 14; Creatinine, Ser 1.10; Potassium 3.9; Sodium 134 07/17/2021: Hemoglobin 10.9; Platelets 234  Hemoglobin A1c 8.9%  Recent Lipid Panel    Component Value Date/Time   CHOL 115 07/15/2021 0348   TRIG 59 07/15/2021 0348   HDL 44 07/15/2021 0348   CHOLHDL 2.6 07/15/2021 0348   VLDL 12 07/15/2021 0348    LDLCALC 59 07/15/2021 0348   LDLDIRECT 143 (H) 10/10/2008 2042    ECG personally reviewed by me today-ordered today, shows normal sinus rhythm  and old right bundle branch block, no acute ischemic abnormalities  Echo 07/15/2021 1. Left ventricular ejection fraction, by estimation, is 55 to 60%. The  left ventricle has normal function. The left ventricle has no regional  wall motion abnormalities. There is moderate asymmetric left ventricular  hypertrophy of the septal segment.  Left ventricular diastolic parameters are indeterminate.   2. Right ventricular systolic function is mildly reduced. The right  ventricular size is mildly enlarged.   3. Right atrial size was mildly dilated.   4. The mitral valve is normal in structure. No evidence of mitral valve  regurgitation. No evidence of mitral stenosis.   5. Tricuspid valve regurgitation is moderate.   6. The aortic valve is normal in structure. Aortic valve regurgitation is  not visualized. Mild aortic valve sclerosis is present, with no evidence  of aortic valve stenosis.   7. The inferior vena cava is normal in size with greater than 50%  respiratory variability, suggesting right atrial pressure of 3 mmHg.    TEE with cardioversion  1. Left ventricular ejection fraction, by estimation, is 55 to 60%. The  left ventricle has normal function.   2. Right ventricular systolic function is normal. The right ventricular  size is normal.   3. Left atrial size was moderately dilated. No left atrial/left atrial  appendage thrombus was detected.   4. The mitral valve is normal in structure. Trivial mitral valve  regurgitation.   5. Tricuspid valve regurgitation is moderate.   6. The aortic valve is tricuspid. Aortic valve regurgitation is not  visualized.   7. Limited study performed: history of esophageal stricture s/p prior  dilation. Slight resistance when attempting gastric imaging so this was  deferred.    Once the TEE was complete,  the patient had the defibrillator pads placed in the anterior and posterior position. Once an appropriate level of sedation was confirmed, the patient was cardioverted x 1 with 200J of biphasic synchronized energy.  The patient converted to NSR.  There were no apparent complications.  The patient had normal neuro status and respiratory status post procedure with vitals stable as recorded elsewhere.  Adequate airway was maintained throughout and vital signs monitored per protocol.  Assessment & Plan   1. Chronic diastolic heart failure (HCC)   2. Paroxysmal atrial fibrillation ([place])   3. Acquired thrombophilia ([place])   4. Coronary artery disease involving native coronary artery of native heart without angina pectoris   5. DM (diabetes mellitus), type 2 with peripheral vascular complications ([place])   6. PAD (peripheral artery disease) ([place])   7. Essential hypertension   8. Morbid obesity ([place])      1. Chronic diastolic CHF: She has chronic edema due to peripheral venous insufficiency, but otherwise, as far as I can tell she is euvolemic.  Her physical exam is limited by morbid obesity.  Functional status is hard to assess since her knee problems make her very sedentary.  Weight has been steady at about 280 pounds over the last 3 months.  Advised her to call if she gains more than 5 pounds from the current weight (that is if she exceeds 285 pounds equivalent on our office scale). 2.  Paroxysmal/persistent AFib: Sometimes presents with an atypical atrial flutter with variable degrees of AV block, seems to have good ventricular rate control.  Usually with good ventricular rate control, but required cardioversion in October 2022.  She has a standing prescription for metoprolol succinate plus an additional "as needed" prescription for  metoprolol tartrate for rapid heartbeats.  Would be concerned if she has protracted heart rates lasting more than 24 hours.  CHA2DS2-VASc 6 (age, gender, diabetes, heart  failure, CAD/PAD, hypertension) 3.  Anticoagulation: INR in therapeutic range.  No bleeding problems. 4. Coronary artery disease status post CABG: He does not have angina pectoris.  Other than hyperglycemia and morbid obesity, her risk factors all appear to be reasonably well controlled. 5. DM: Glycemic control has deteriorated, most recent hemoglobin A1c 8.9%. 6. PAD: Denies claudication but this could well be masked by her sedentary lifestyle.  Attempted to check ABIs in January of this year, but her arteries are noncompressible.  TBI showed improvement from previous tests. 7.  Essential hypertension: Well-controlled.  No change in medications.  Patient Instructions  Medication Instructions:  No changes *If you need a refill on your cardiac medications before your next appointment, please call your pharmacy*   Lab Work: None ordered If you have labs (blood work) drawn today and your tests are completely normal, you will receive your results only by: [place] (if you have MyChart) OR A paper copy in the mail If you have any lab test that is abnormal or we need to change your treatment, we will call you to review the results.   Testing/Procedures: None ordered   Follow-Up: At [facility], you and your health needs are our priority.  As part of our continuing mission to provide you with exceptional heart care, we have created designated Provider Care Teams.  These Care Teams include your primary Cardiologist (physician) and Advanced Practice Providers (APPs -  Physician Assistants and Nurse Practitioners) who all work together to provide you with the care you need, when you need it.  We recommend signing up for the patient portal called "MyChart".  Sign up information is provided on this After Visit Summary.  MyChart is used to connect with patients for Virtual Visits (Telemedicine).  Patients are able to view lab/test results, encounter notes, upcoming appointments, etc.   Non-urgent messages can be sent to your provider as well.   To learn more about what you can do with MyChart, go to [email or web address].    Your next appointment:   6 month(s)  The format for your next appointment:   In Person  Provider:   Sanda Klein, MD {

## 2021-12-04 NOTE — Patient Instructions (Signed)

## 2022-01-01 ENCOUNTER — Ambulatory Visit (INDEPENDENT_AMBULATORY_CARE_PROVIDER_SITE_OTHER): Payer: HMO

## 2022-01-01 DIAGNOSIS — Z7901 Long term (current) use of anticoagulants: Secondary | ICD-10-CM | POA: Diagnosis not present

## 2022-01-01 DIAGNOSIS — I480 Paroxysmal atrial fibrillation: Secondary | ICD-10-CM | POA: Diagnosis not present

## 2022-01-01 DIAGNOSIS — Z5181 Encounter for therapeutic drug level monitoring: Secondary | ICD-10-CM | POA: Diagnosis not present

## 2022-01-01 LAB — POCT INR: INR: 3.4 — AB (ref 2.0–3.0)

## 2022-01-01 NOTE — Patient Instructions (Signed)
HOLD TONIGHT ONLY and then Continue 1 tablet daily except for 0.5 tablet on Monday. Recheck INR in 4 weeks. [facility] [phone] ?

## 2022-01-29 ENCOUNTER — Ambulatory Visit (INDEPENDENT_AMBULATORY_CARE_PROVIDER_SITE_OTHER): Payer: HMO

## 2022-01-29 DIAGNOSIS — Z7901 Long term (current) use of anticoagulants: Secondary | ICD-10-CM

## 2022-01-29 DIAGNOSIS — I480 Paroxysmal atrial fibrillation: Secondary | ICD-10-CM | POA: Diagnosis not present

## 2022-01-29 DIAGNOSIS — Z5181 Encounter for therapeutic drug level monitoring: Secondary | ICD-10-CM | POA: Diagnosis not present

## 2022-01-29 LAB — POCT INR: INR: 3.3 — AB (ref 2.0–3.0)

## 2022-01-29 NOTE — Patient Instructions (Signed)
HOLD TODAY ONLY and then decrease to 1 tablet daily except for 0.5 tablet on Monday and Friday. Recheck INR in 5 weeks. [facility] [phone] ?

## 2022-03-03 ENCOUNTER — Ambulatory Visit (INDEPENDENT_AMBULATORY_CARE_PROVIDER_SITE_OTHER): Payer: HMO

## 2022-03-03 DIAGNOSIS — I480 Paroxysmal atrial fibrillation: Secondary | ICD-10-CM | POA: Diagnosis not present

## 2022-03-03 DIAGNOSIS — Z7901 Long term (current) use of anticoagulants: Secondary | ICD-10-CM

## 2022-03-03 LAB — POCT INR: INR: 2.6 (ref 2.0–3.0)

## 2022-03-03 NOTE — Patient Instructions (Addendum)
Description   Continue taking 1 tablet daily except for 0.5 tablet on Monday and Friday. Recheck INR in 6 weeks.  [facility] [phone]

## 2022-03-11 DIAGNOSIS — I10 Essential (primary) hypertension: Secondary | ICD-10-CM | POA: Diagnosis not present

## 2022-03-11 DIAGNOSIS — I130 Hypertensive heart and chronic kidney disease with heart failure and stage 1 through stage 4 chronic kidney disease, or unspecified chronic kidney disease: Secondary | ICD-10-CM | POA: Diagnosis not present

## 2022-03-11 DIAGNOSIS — I2510 Atherosclerotic heart disease of native coronary artery without angina pectoris: Secondary | ICD-10-CM | POA: Diagnosis not present

## 2022-03-11 DIAGNOSIS — E10319 Type 1 diabetes mellitus with unspecified diabetic retinopathy without macular edema: Secondary | ICD-10-CM | POA: Diagnosis not present

## 2022-03-11 DIAGNOSIS — N1831 Chronic kidney disease, stage 3a: Secondary | ICD-10-CM | POA: Diagnosis not present

## 2022-03-11 DIAGNOSIS — I129 Hypertensive chronic kidney disease with stage 1 through stage 4 chronic kidney disease, or unspecified chronic kidney disease: Secondary | ICD-10-CM | POA: Diagnosis not present

## 2022-03-11 DIAGNOSIS — E782 Mixed hyperlipidemia: Secondary | ICD-10-CM | POA: Diagnosis not present

## 2022-03-17 DIAGNOSIS — E10319 Type 1 diabetes mellitus with unspecified diabetic retinopathy without macular edema: Secondary | ICD-10-CM | POA: Diagnosis not present

## 2022-03-17 DIAGNOSIS — I10 Essential (primary) hypertension: Secondary | ICD-10-CM | POA: Diagnosis not present

## 2022-03-17 DIAGNOSIS — I2510 Atherosclerotic heart disease of native coronary artery without angina pectoris: Secondary | ICD-10-CM | POA: Diagnosis not present

## 2022-03-17 DIAGNOSIS — I4821 Permanent atrial fibrillation: Secondary | ICD-10-CM | POA: Diagnosis not present

## 2022-03-17 DIAGNOSIS — I129 Hypertensive chronic kidney disease with stage 1 through stage 4 chronic kidney disease, or unspecified chronic kidney disease: Secondary | ICD-10-CM | POA: Diagnosis not present

## 2022-03-17 DIAGNOSIS — I700 Atherosclerosis of aorta: Secondary | ICD-10-CM | POA: Diagnosis not present

## 2022-03-17 DIAGNOSIS — I130 Hypertensive heart and chronic kidney disease with heart failure and stage 1 through stage 4 chronic kidney disease, or unspecified chronic kidney disease: Secondary | ICD-10-CM | POA: Diagnosis not present

## 2022-03-17 DIAGNOSIS — Z23 Encounter for immunization: Secondary | ICD-10-CM | POA: Diagnosis not present

## 2022-03-17 DIAGNOSIS — N1831 Chronic kidney disease, stage 3a: Secondary | ICD-10-CM | POA: Diagnosis not present

## 2022-03-17 DIAGNOSIS — D692 Other nonthrombocytopenic purpura: Secondary | ICD-10-CM | POA: Diagnosis not present

## 2022-03-17 DIAGNOSIS — D6869 Other thrombophilia: Secondary | ICD-10-CM | POA: Diagnosis not present

## 2022-04-07 ENCOUNTER — Telehealth: Payer: Self-pay | Admitting: Cardiovascular Disease

## 2022-04-07 DIAGNOSIS — I480 Paroxysmal atrial fibrillation: Secondary | ICD-10-CM

## 2022-04-07 NOTE — Telephone Encounter (Signed)
*  STAT* If patient is at the pharmacy, call can be transferred to refill team.   1. Which medications need to be refilled? (please list name of each medication and dose if known)  warfarin (COUMADIN) 7.5 MG tablet  2. Which pharmacy/location (including street and city if local pharmacy) is medication to be sent to? [facility] - [place], [place]  3. Do they need a 30 day or 90 day supply? 90 day   Patient is almost out of medication

## 2022-04-08 MED ORDER — WARFARIN SODIUM 7.5 MG PO TABS: ORAL_TABLET | ORAL | 1 refills | Status: DC

## 2022-04-14 ENCOUNTER — Ambulatory Visit (INDEPENDENT_AMBULATORY_CARE_PROVIDER_SITE_OTHER): Payer: PPO | Admitting: *Deleted

## 2022-04-14 DIAGNOSIS — I480 Paroxysmal atrial fibrillation: Secondary | ICD-10-CM

## 2022-04-14 DIAGNOSIS — Z7901 Long term (current) use of anticoagulants: Secondary | ICD-10-CM | POA: Diagnosis not present

## 2022-04-14 LAB — POCT INR: INR: 3 (ref 2.0–3.0)

## 2022-04-14 NOTE — Patient Instructions (Signed)
Description   Today take 1/2 tablet then continue taking 1 tablet daily except for 1/2 tablet on Monday and Friday. Recheck INR in 6 weeks. [facility]in Clinic [phone]

## 2022-04-29 ENCOUNTER — Telehealth: Payer: Self-pay | Admitting: Emergency Medicine

## 2022-04-29 NOTE — Telephone Encounter (Signed)
Patient called and she has office visit with [facility] on 07/09/22. She is wanting to schedule her CT scan.  She needs CT Scan before office visit please.  Thank you

## 2022-04-30 NOTE — Telephone Encounter (Signed)
Done pt aware °

## 2022-05-13 DIAGNOSIS — R600 Localized edema: Secondary | ICD-10-CM | POA: Diagnosis not present

## 2022-05-13 DIAGNOSIS — I872 Venous insufficiency (chronic) (peripheral): Secondary | ICD-10-CM | POA: Diagnosis not present

## 2022-05-13 DIAGNOSIS — E1142 Type 2 diabetes mellitus with diabetic polyneuropathy: Secondary | ICD-10-CM | POA: Diagnosis not present

## 2022-05-26 ENCOUNTER — Ambulatory Visit: Payer: HMO | Attending: Cardiovascular Disease | Admitting: *Deleted

## 2022-05-26 DIAGNOSIS — I480 Paroxysmal atrial fibrillation: Secondary | ICD-10-CM | POA: Diagnosis not present

## 2022-05-26 DIAGNOSIS — Z7901 Long term (current) use of anticoagulants: Secondary | ICD-10-CM | POA: Diagnosis not present

## 2022-05-26 LAB — POCT INR: INR: 3.9 — AB (ref 2.0–3.0)

## 2022-05-26 NOTE — Patient Instructions (Signed)
Description   Do not take any Warfarin today then continue taking 1 tablet daily except for 1/2 tablet on Monday and Friday. Recheck INR in 4 weeks. Coumadin Clinic [phone]

## 2022-06-09 ENCOUNTER — Encounter (HOSPITAL_BASED_OUTPATIENT_CLINIC_OR_DEPARTMENT_OTHER): Payer: HMO | Attending: Physician Assistant | Admitting: Physician Assistant

## 2022-06-09 DIAGNOSIS — I87331 Chronic venous hypertension (idiopathic) with ulcer and inflammation of right lower extremity: Secondary | ICD-10-CM | POA: Diagnosis not present

## 2022-06-09 DIAGNOSIS — E11622 Type 2 diabetes mellitus with other skin ulcer: Secondary | ICD-10-CM | POA: Insufficient documentation

## 2022-06-09 DIAGNOSIS — I890 Lymphedema, not elsewhere classified: Secondary | ICD-10-CM | POA: Insufficient documentation

## 2022-06-09 DIAGNOSIS — L97212 Non-pressure chronic ulcer of right calf with fat layer exposed: Secondary | ICD-10-CM | POA: Diagnosis not present

## 2022-06-09 DIAGNOSIS — Z7901 Long term (current) use of anticoagulants: Secondary | ICD-10-CM | POA: Insufficient documentation

## 2022-06-09 DIAGNOSIS — I872 Venous insufficiency (chronic) (peripheral): Secondary | ICD-10-CM | POA: Insufficient documentation

## 2022-06-09 DIAGNOSIS — I10 Essential (primary) hypertension: Secondary | ICD-10-CM | POA: Diagnosis not present

## 2022-06-09 DIAGNOSIS — E1151 Type 2 diabetes mellitus with diabetic peripheral angiopathy without gangrene: Secondary | ICD-10-CM | POA: Diagnosis not present

## 2022-06-09 DIAGNOSIS — L97812 Non-pressure chronic ulcer of other part of right lower leg with fat layer exposed: Secondary | ICD-10-CM | POA: Insufficient documentation

## 2022-06-09 NOTE — Progress Notes (Signed)
MISCHELL, BRANFORD ([id number]) Visit Report for 06/09/2022 Abuse Risk Screen Details Patient Name: Date of Service: [name], [name] 06/09/2022 9:45 A M Medical Record Number: [id number] Patient Account Number: [id number] Date of Birth/Sex: Treating RN: 1951-02-18 (71 y.o. [name] Primary Care Sylwia Cuervo: [name] Other Clinician: Referring Turner Baillie: Treating Jaysiah Marchetta/Extender: [name], [name] in Treatment: 0 Abuse Risk Screen Items Answer ABUSE RISK SCREEN: Has anyone close to you tried to hurt or harm you recentlyo No Do you feel uncomfortable with anyone in your familyo No Has anyone forced you do things that you didnt want to doo No Electronic Signature(s) Signed: 06/09/2022 5:59:54 PM By: Deon Pilling RN, BSN Entered By: Deon Pilling on 06/09/2022 10:03:44 -------------------------------------------------------------------------------- Activities of Daily Living Details Patient Name: Date of Service: [name], [name] 06/09/2022 9:45 A M Medical Record Number: [id number] Patient Account Number: [id number] Date of Birth/Sex: Treating RN: 1951/02/23 (71 y.o. [name] Primary Care Mir Fullilove: [name] Other Clinician: Referring Bailen Geffre: Treating Elizebeth Kluesner/Extender: [name], [name] in Treatment: 0 Activities of Daily Living Items Answer Activities of Daily Living (Please select one for each item) Drive Automobile Not Able T Medications ake Completely Able Use T elephone Completely Able Care for Appearance Need Assistance Use T oilet Completely Able Bath / Shower Completely Able Dress Self Completely Able Feed Self Completely Able Walk Need Assistance Get In / Out Bed Completely [place] Need Assistance Shop for Self Need Assistance Electronic Signature(s) Signed: 06/09/2022 5:59:54 PM By: Deon Pilling RN,  BSN Entered By: Deon Pilling on 06/09/2022 10:04:07 -------------------------------------------------------------------------------- Education Screening Details Patient Name: Date of Service: Brandi Morgans A. 06/09/2022 9:45 A M Medical Record Number: [id number] Patient Account Number: [id number] Date of Birth/Sex: Treating RN: Jul 29, 1951 (71 y.o. [name] Primary Care Channell Quattrone: [name] Other Clinician: Referring Keenen Roessner: Treating Larra Crunkleton/Extender: Angelica Pou in Treatment: 0 Primary Learner Assessed: Patient Learning Preferences/Education Level/Primary Language Learning Preference: Explanation, Demonstration, Printed Material Highest Education Level: High School Preferred Language: English Cognitive Barrier Language Barrier: No Translator Needed: No Memory Deficit: No Emotional Barrier: No Cultural/Religious Beliefs Affecting Medical Care: No Physical Barrier Impaired Vision: Yes Glasses, readers Impaired Hearing: No Decreased Hand dexterity: No Knowledge/Comprehension Knowledge Level: High Comprehension Level: High Ability to understand written instructions: High Ability to understand verbal instructions: High Motivation Anxiety Level: Calm Cooperation: Cooperative Education Importance: Acknowledges Need Interest in Health Problems: Asks Questions Perception: Coherent Willingness to Engage in Self-Management High Activities: Readiness to Engage in Self-Management High Activities: Electronic Signature(s) Signed: 06/09/2022 5:59:54 PM By: Deon Pilling RN, BSN Entered By: Deon Pilling on 06/09/2022 10:04:31 -------------------------------------------------------------------------------- Fall Risk Assessment Details Patient Name: Date of Service: Brandi Morgans A. 06/09/2022 9:45 A M Medical Record Number: [id number] Patient Account Number: [id number] Date of Birth/Sex: Treating RN: 17-Jul-1951 (71 y.o. [name] Primary Care [name]: [name] Other Clinician: Referring [name]: Treating [name]/Extender: [name], [name] in Treatment: 0 Fall Risk Assessment Items Have you had 2 or more falls in the last 12 monthso 0 No Have you had any fall that resulted in injury in the last 12 monthso 0 No FALLS RISK SCREEN History of falling - immediate or within 3 months 0 No Secondary diagnosis (Do you have 2 or more medical diagnoseso) 0 No Ambulatory aid None/bed rest/wheelchair/nurse 0 Yes Crutches/cane/walker 15 Yes Furniture 0 No Intravenous therapy Access/Saline/Heparin  Lock 0 No Gait/Transferring Normal/ bed rest/ wheelchair 0 No Weak (short steps with or without shuffle, stooped but able to lift head while walking, may seek 10 Yes support from furniture) Impaired (short steps with shuffle, may have difficulty arising from chair, head down, impaired 0 No balance) Mental Status Oriented to own ability 0 Yes Electronic Signature(s) Signed: 06/09/2022 5:59:54 PM By: Deon Pilling RN, BSN Entered By: Deon Pilling on 06/09/2022 10:06:11 -------------------------------------------------------------------------------- Foot Assessment Details Patient Name: Date of Service: Brandi Morgans A. 06/09/2022 9:45 A M Medical Record Number: [id number] Patient Account Number: [id number] Date of Birth/Sex: Treating RN: 1951-06-06 (71 y.o. [name] Primary Care Breslin Burklow: [name] Other Clinician: Referring Shaketha Jeon: Treating [name]/Extender: [name], [name] in Treatment: 0 Foot Assessment Items Site Locations + = Sensation present, - = Sensation absent, C = Callus, U = Ulcer R = Redness, W = Warmth, M = Maceration, PU = Pre-ulcerative lesion F = Fissure, S = Swelling, D = Dryness Assessment Right: Left: Other Deformity: No No Prior Foot Ulcer: No No Prior Amputation: No No Charcot Joint: No  No Ambulatory Status: Ambulatory With Help Assistance Device: Walker Gait: Administrator, arts) Signed: 06/09/2022 5:59:54 PM By: Deon Pilling RN, BSN Entered By: Deon Pilling on 06/09/2022 10:13:09 -------------------------------------------------------------------------------- Nutrition Risk Screening Details Patient Name: Date of Service: KI, LUCKMAN A. 06/09/2022 9:45 A M Medical Record Number: [id number] Patient Account Number: [id number] Date of Birth/Sex: Treating RN: 1950/10/28 (71 y.o. [name] Primary Care Theresia Pree: [name] Other Clinician: Referring Shaniqua Guillot: Treating Danya Spearman/Extender: [name], [name] in Treatment: 0 Height (in): 63 Weight (lbs): 284 Body Mass Index (BMI): 50.3 Nutrition Risk Screening Items Score Screening NUTRITION RISK SCREEN: I have an illness or condition that made me change the kind and/or amount of food I eat 2 Yes I eat fewer than two meals per day 0 No I eat few fruits and vegetables, or milk products 0 No I have three or more drinks of beer, liquor or wine almost every day 0 No I have tooth or mouth problems that make it hard for me to eat 0 No I don't always have enough money to buy the food I need 0 No I eat alone most of the time 0 No I take three or more different prescribed or over-the-counter drugs a day 1 Yes Without wanting to, I have lost or gained 10 pounds in the last six months 0 No I am not always physically able to shop, cook and/or feed myself 2 Yes Nutrition Protocols Good Risk Protocol Provide education on elevated blood Moderate Risk Protocol 0 sugars and impact on wound healing, as applicable High Risk Proctocol Risk Level: Moderate Risk Score: 5 Electronic Signature(s) Signed: 06/09/2022 5:59:54 PM By: Deon Pilling RN, BSN Entered By: Deon Pilling on 06/09/2022 10:07:07

## 2022-06-09 NOTE — Progress Notes (Signed)
PAMLA, PANGLE ([id number]) Visit Report for 06/09/2022 Chief Complaint Document Details Patient Name: Date of Service: MELEANE, [name] 06/09/2022 9:45 A M Medical Record Number: [id number] Patient Account Number: [id number] Date of Birth/Sex: Treating RN: Sep 27, 1951 (71 y.o. F) Primary Care Provider: [name] Other Clinician: Referring Provider: Treating Provider/Extender: [name], [name] in Treatment: 0 Information Obtained from: Patient Chief Complaint Right LE Ulcers Electronic Signature(s) Signed: 06/09/2022 10:37:56 AM By: Worthy Keeler PA-C Entered By: Worthy Keeler on 06/09/2022 10:37:56 -------------------------------------------------------------------------------- HPI Details Patient Name: Date of Service: Brandi Morgans A. 06/09/2022 9:45 A M Medical Record Number: [id number] Patient Account Number: [id number] Date of Birth/Sex: Treating RN: December 08, 1950 (71 y.o. F) Primary Care Provider: [name] Other Clinician: Referring Provider: Treating Provider/Extender: [name], [name] in Treatment: 0 History of Present Illness HPI Description: 08/05/2021 upon evaluation today patient has bilateral wounds on her lower extremities. She has been tolerating the dressing changes without complication at home but unfortunately is not really seeing signs of improvement like she would like to see. She does have what appears to be bilateral lymphedema due to chronic venous insufficiency most likely. She is on fluid pills and takes them regularly. She really does not sit with her legs elevated much she has a recliner but again does not use that often. I think she should be using this more to get her feet up. Subsequently she tells me this has been going on for several weeks to even months. She finally decided to make a referral herself to come in to be seen. Patient does have a history of diabetes mellitus  type 2, some evidence of potential peripheral vascular disease as she is noncompressible with her ABIs in the office today, hypertension, and she is on long-term anticoagulant therapy. 08/12/2021 upon evaluation today patient's wounds appear to be doing well in regard to her legs currently. Actually very pleased with where things stand and I do not see any signs of active infection which is great news. No fevers, chills, nausea, vomiting, or diarrhea. 08/19/2021 upon evaluation today patient appears to be doing well with regard to her legs. Everything actually appears to be healing quite nicely with doing extremely well. Fortunately there does not appear to be any signs of active infection which is great news. Readmission: 06-09-2022 upon evaluation today patient appears for reevaluation in the clinic concerning issues that she has been having with her leg. This is actually her right leg at this time though when I saw her previously was both legs. Fortunately there does not appear to be any evidence of active infection at this time which is great news. No fevers, chills, nausea, vomiting, or diarrhea. Her past medical history really has not changed significantly since last time I saw her. I did discuss with her today how she has been doing with wearing her compression socks she tells me she really has not been wearing those because "she cannot get them on her son is not able to help. She tells me that he works at night and by the time he comes and goes to bed she is not up yet and then she wakes up later in the past do not cross. I talked her today about actually having a time later in the day when he could help with getting the socks on and that is a possibility. With that being said she currently states that she had not thought through that completely I  am not sure if that something she has been worked out or not. Electronic Signature(s) Signed: 06/09/2022 5:40:55 PM By: Worthy Keeler PA-C Signed:  06/09/2022 5:40:55 PM By: Worthy Keeler PA-C Entered By: Worthy Keeler on 06/09/2022 17:40:54 -------------------------------------------------------------------------------- Physical Exam Details Patient Name: Date of Service: Brandi Morgans A. 06/09/2022 9:45 A M Medical Record Number: [id number] Patient Account Number: [id number] Date of Birth/Sex: Treating RN: [birth year] (71 y.o. F) Primary Care Provider: [name] Other Clinician: Referring Provider: Treating Provider/Extender: [name], [name] in Treatment: 0 Constitutional patient is hypertensive.. pulse regular and within target range for patient.Marland Kitchen respirations regular, non-labored and within target range for patient.Marland Kitchen temperature within target range for patient.. Chronically ill appearing but in no apparent acute distress. Eyes conjunctiva clear no eyelid edema noted. pupils equal round and reactive to light and accommodation. Ears, Nose, Mouth, and Throat no gross abnormality of ear auricles or external auditory canals. normal hearing noted during conversation. mucus membranes moist. Respiratory normal breathing without difficulty. Cardiovascular Absent posterior tibial and dorsalis pedis pulses bilateral lower extremities. 2+ pitting edema of the bilateral lower extremities. Musculoskeletal normal gait and posture. no significant deformity or arthritic changes, no loss or range of motion, no clubbing. Psychiatric this patient is able to make decisions and demonstrates good insight into disease process. Alert and Oriented x 3. pleasant and cooperative. Notes Upon inspection patient's wound bed actually showed signs of good granulation for the most part. There appears to be a very superficial wound I do not see anything that I think is good to be a major issue here. With that being said I do believe that the patient needs to be wearing compression appropriately which has not been the  case up to this point. Electronic Signature(s) Signed: 06/09/2022 5:44:16 PM By: Worthy Keeler PA-C Entered By: Worthy Keeler on 06/09/2022 17:44:16 -------------------------------------------------------------------------------- Physician Orders Details Patient Name: Date of Service: Brandi Morgans A. 06/09/2022 9:45 A M Medical Record Number: [id number] Patient Account Number: [id number] Date of Birth/Sex: Treating RN: [birth year] (71 y.o. 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 Jeri Cos, PA and [place], Room 8 Wednesday Return A Other: - Patient to follow up with Primary Care Provider to see if they can order you an aid to help you with activities of daily living. Ensure you call to see Vein and Vascular at the end of the month related to both legs edema, burning, and pain. Anesthetic (In clinic) Topical Lidocaine 5% applied to wound bed Bathing/ Shower/ Hygiene May shower with protection but do not get wound dressing(s) wet. Edema Control - Lymphedema / SCD / Other Elevate legs to the level of the heart or above for 30 minutes daily and/or when sitting, a frequency of: - 3-4 times a day throughout the day ensure to elevate legs. Avoid standing for long periods of time. Exercise  regularly Moisturize legs daily. - need lotion to left leg  at night. Apply Tubigrip size E in clinic and may use under the juxtalite HD if unable to get socks on. Compression stocking or Garment 20-30 mm/Hg pressure to: - Patient to work on wearing the juxtalite HD to left leg. Apply daily. Wound Treatment Wound #3 - Lower Leg Wound Laterality: Right, Posterior Cleanser: Soap and Water 1 x Per Week/30 Days Discharge Instructions: May shower and wash wound with dial antibacterial soap and water prior to dressing change. Peri-Wound Care: Triamcinolone 15 (g) 1 x Per Week/30 Days Discharge Instructions: Use triamcinolone 15 (g) as directed Peri-Wound Care: Sween Lotion (Moisturizing lotion) 1 x Per Week/30 Days Discharge Instructions: Apply moisturizing lotion as directed Prim Dressing: KerraCel Ag Gelling Fiber Dressing, 4x5 in (silver alginate) 1 x Per Week/30 Days ary Discharge Instructions: Apply silver alginate to wound bed as instructed Secondary Dressing: ABD Pad, 8x10 1 x Per Week/30 Days Discharge Instructions: Apply over primary dressing as directed. Compression Wrap: Unnaboot w/Calamine, 4x10 (in/yd) 1 x Per Week/30 Days Discharge Instructions: Apply Unnaboot as directed. Patient Medications llergies: morphine, levofloxacin A Notifications Medication Indication Start End 06/09/2022 lidocaine DOSE topical 5 % cream - cream topical applied only in clinic for any debridements. Electronic Signature(s) Signed: 06/09/2022 5:46:43 PM By: Worthy Keeler PA-C Signed: 06/09/2022 5:59:54 PM By: Deon Pilling RN, BSN Entered By: Deon Pilling on 06/09/2022 10:53:59 -------------------------------------------------------------------------------- Problem List Details Patient Name: Date of Service: Brandi Morgans A. 06/09/2022 9:45 A M Medical Record Number: [id number] Patient Account Number: [id number] Date of Birth/Sex: Treating RN: 1951-01-01 (71 y.o. F) Primary Care Provider:  [name] Other Clinician: Referring Provider: Treating Provider/Extender: [name], [name] in Treatment: 0 Active Problems ICD-10 Encounter Code Description Active Date MDM Diagnosis I89.0 Lymphedema, not elsewhere classified 06/09/2022 No Yes I87.331 Chronic venous hypertension (idiopathic) with ulcer and inflammation of right 06/09/2022 No Yes lower extremity L97.812 Non-pressure chronic ulcer of other part of right lower leg with fat layer 06/09/2022 No Yes exposed E11.622 Type 2 diabetes mellitus with other skin ulcer 06/09/2022 No Yes I73.89 Other specified peripheral vascular diseases 06/09/2022 No Yes I10 Essential (primary) hypertension 06/09/2022 No Yes Z79.01 Long term (current) use of anticoagulants 06/09/2022 No Yes Inactive Problems Resolved Problems Electronic Signature(s) Signed: 06/09/2022 10:37:36 AM By: Worthy Keeler PA-C Entered By: Worthy Keeler on 06/09/2022 10:37:36 -------------------------------------------------------------------------------- Progress Note Details Patient Name: Date of Service: Brandi Morgans A. 06/09/2022 9:45 A M Medical Record Number: [id number] Patient Account Number: [id number] Date of Birth/Sex: Treating RN: June 17, 1951 (71 y.o. F) Primary Care Provider: [name] Other Clinician: Referring Provider: Treating Provider/Extender: [name], [name] in Treatment: 0 Subjective Chief Complaint Information obtained from Patient Right LE Ulcers History of Present Illness (HPI) 08/05/2021 upon evaluation today patient has bilateral wounds on her lower extremities. She has been tolerating the dressing changes without complication at home but unfortunately is not really seeing signs of improvement like she would like to see. She does have what appears to be bilateral lymphedema due to chronic venous insufficiency most likely. She is on fluid pills and takes them  regularly. She really does not sit with her legs elevated much she has a recliner but again does not use that often. I think she should be using this more to get her feet up. Subsequently she tells me this has been going on for several weeks to even months. She finally decided to make a referral herself to come in to be seen. Patient  does have a history of diabetes mellitus type 2, some evidence of potential peripheral vascular disease as she is noncompressible with her ABIs in the office today, hypertension, and she is on long-term anticoagulant therapy. 08/12/2021 upon evaluation today patient's wounds appear to be doing well in regard to her legs currently. Actually very pleased with where things stand and I do not see any signs of active infection which is great news. No fevers, chills, nausea, vomiting, or diarrhea. 08/19/2021 upon evaluation today patient appears to be doing well with regard to her legs. Everything actually appears to be healing quite nicely with doing extremely well. Fortunately there does not appear to be any signs of active infection which is great news. Readmission: 06-09-2022 upon evaluation today patient appears for reevaluation in the clinic concerning issues that she has been having with her leg. This is actually her right leg at this time though when I saw her previously was both legs. Fortunately there does not appear to be any evidence of active infection at this time which is great news. No fevers, chills, nausea, vomiting, or diarrhea. Her past medical history really has not changed significantly since last time I saw her. I did discuss with her today how she has been doing with wearing her compression socks she tells me she really has not been wearing those because "she cannot get them on her son is not able to help. She tells me that he works at night and by the time he comes and goes to bed she is not up yet and then she wakes up later in the past do not cross. I  talked her today about actually having a time later in the day when he could help with getting the socks on and that is a possibility. With that being said she currently states that she had not thought through that completely I am not sure if that something she has been worked out or not. Patient History Information obtained from Patient. Allergies morphine, levofloxacin Family History Cancer - Siblings, Diabetes - Siblings, Heart Disease - Mother,Father, Hypertension - Father,Mother, No family history of Hereditary Spherocytosis, Kidney Disease, Lung Disease, Seizures, Stroke, Thyroid Problems, Tuberculosis. Social History Former smoker - Quit 1986, Marital Status - Single, Alcohol Use - Never, Drug Use - No History, Caffeine Use - Moderate. Medical History Eyes Patient has history of Cataracts - Removed Respiratory Patient has history of Sleep Apnea - no CPAP or BIPAP Cardiovascular Patient has history of Arrhythmia - Ballardfib, Coronary Artery Disease, Hypertension, Peripheral Venous Disease Denies history of Congestive Heart Failure Endocrine Patient has history of Type II Diabetes Musculoskeletal Patient has history of Osteoarthritis Neurologic Patient has history of Neuropathy Hospitalization/Surgery History - 0/20/2022 cardioverison. Medical A Surgical History Notes nd Respiratory Lung Cancer-surgery 2010 Oncologic Lung Cancer, no radiation or chemo Review of Systems (ROS) Integumentary (Skin) Complains or has symptoms of Wounds - BLE. Objective Constitutional patient is hypertensive.. pulse regular and within target range for patient.Marland Kitchen respirations regular, non-labored and within target range for patient.Marland Kitchen temperature within target range for patient.. Chronically ill appearing but in no apparent acute distress. Vitals Time Taken: 10:00 AM, Height: 63 in, Source: Stated, Weight: 284 lbs, Source: Stated, BMI: 50.3, Temperature: 98.3 F, Pulse: 109 bpm, Respiratory Rate: 20  breaths/min, Blood Pressure: 165/85 mmHg, Capillary Blood Glucose: 135 mg/dl. Eyes conjunctiva clear no eyelid edema noted. pupils equal round and reactive to light and accommodation. Ears, Nose, Mouth, and Throat no gross abnormality of ear auricles or external  auditory canals. normal hearing noted during conversation. mucus membranes moist. Respiratory normal breathing without difficulty. Cardiovascular Absent posterior tibial and dorsalis pedis pulses bilateral lower extremities. 2+ pitting edema of the bilateral lower extremities. Musculoskeletal normal gait and posture. no significant deformity or arthritic changes, no loss or range of motion, no clubbing. Psychiatric this patient is able to make decisions and demonstrates good insight into disease process. Alert and Oriented x 3. pleasant and cooperative. General Notes: Upon inspection patient's wound bed actually showed signs of good granulation for the most part. There appears to be a very superficial wound I do not see anything that I think is good to be a major issue here. With that being said I do believe that the patient needs to be wearing compression appropriately which has not been the case up to this point. Integumentary (Hair, Skin) Wound #3 status is Open. Original cause of wound was Blister. The date acquired was: 05/26/2022. The wound is located on the Right,Posterior Lower Leg. The wound measures 2.7cm length x 3cm width x 0.1cm depth; 6.362cm^2 area and 0.636cm^3 volume. There is Fat Layer (Subcutaneous Tissue) exposed. There is no tunneling or undermining noted. There is a medium amount of serosanguineous drainage noted. The wound margin is distinct with the outline attached to the wound base. There is large (67-100%) red, pink granulation within the wound bed. There is no necrotic tissue within the wound bed. Assessment Active Problems ICD-10 Lymphedema, not elsewhere classified Chronic venous hypertension (idiopathic)  with ulcer and inflammation of right lower extremity Non-pressure chronic ulcer of other part of right lower leg with fat layer exposed Type 2 diabetes mellitus with other skin ulcer Other specified peripheral vascular diseases Essential (primary) hypertension Long term (current) use of anticoagulants Procedures Wound #3 Pre-procedure diagnosis of Wound #3 is a Lymphedema located on the Right,Posterior Lower Leg . There was a Haematologist Compression Therapy Procedure by Deon Pilling, RN. Post procedure Diagnosis Wound #3: Same as Pre-Procedure Plan Follow-up Appointments: Return Appointment in 1 week. Jeri Cos, PA and [place], Room 8 Wednesday Other: - Patient to follow up with Primary Care Provider to see if they can order you an aid to help you with activities of daily living. Ensure you call to see Vein and Vascular at the end of the month related to both legs edema, burning, and pain. Anesthetic: (In clinic) Topical Lidocaine 5% applied to wound bed Bathing/ Shower/ Hygiene: May shower with protection but do not get wound dressing(s) wet. Edema Control - Lymphedema / SCD / Other: Elevate legs to the level of the heart or above for 30 minutes daily and/or when sitting, a frequency of: - 3-4 times a day throughout the day ensure to elevate legs. Avoid standing for long periods of time. Exercise regularly Moisturize legs daily. - need lotion to left leg at night. Apply Tubigrip size E in clinic and may use under the juxtalite HD if unable to get socks on. Compression stocking or Garment 20-30 mm/Hg pressure to: - Patient to work on wearing the juxtalite HD to left leg. Apply daily. The following medication(s) was prescribed: lidocaine topical 5 % cream cream topical applied only in clinic for any debridements. was prescribed at facility WOUND #3: - Lower Leg Wound Laterality: Right, Posterior Cleanser: Soap and Water 1 x Per Week/30 Days Discharge Instructions: May shower and wash  wound with dial antibacterial soap and water prior to dressing change. Peri-Wound Care: Triamcinolone 15 (g) 1 x Per Week/30 Days Discharge Instructions:  Use triamcinolone 15 (g) as directed Peri-Wound Care: Sween Lotion (Moisturizing lotion) 1 x Per Week/30 Days Discharge Instructions: Apply moisturizing lotion as directed Prim Dressing: KerraCel Ag Gelling Fiber Dressing, 4x5 in (silver alginate) 1 x Per Week/30 Days ary Discharge Instructions: Apply silver alginate to wound bed as instructed Secondary Dressing: ABD Pad, 8x10 1 x Per Week/30 Days Discharge Instructions: Apply over primary dressing as directed. Com pression Wrap: Unnaboot w/Calamine, 4x10 (in/yd) 1 x Per Week/30 Days Discharge Instructions: Apply Unnaboot as directed. 1. I would recommend currently that we going continue with the recommendation for wound care measures as before and the patient is in agreement with plan. This includes the use of the Unna boot wrap which she has tolerated well in the past without complication. 2. Were also going to utilize silver alginate dressing which I think should do quite well. 3. I am also going to suggest the patient needs to elevate her legs much as possible. 4. She is got a figure out some way to manage this on her own when she heals in order to prevent from having ongoing issues with not being able to keep her swelling under control at home when she heals. Again I discussed that with her again today. She voiced understanding. However I am not sure that this is really can translate into her really doing what needs to be done to be honest. We will see patient back for reevaluation in 1 week here in the clinic. If anything worsens or changes patient will contact our office for additional recommendations. Electronic Signature(s) Signed: 06/09/2022 5:46:08 PM By: Worthy Keeler PA-C Entered By: Worthy Keeler on 06/09/2022  17:46:08 -------------------------------------------------------------------------------- HxROS Details Patient Name: Date of Service: Brandi Morgans A. 06/09/2022 9:45 A M Medical Record Number: [id number] Patient Account Number: [id number] Date of Birth/Sex: Treating RN: 09-14-1951 (71 y.o. [name] Primary Care Provider: [name] Other Clinician: Referring Provider: Treating Provider/Extender: [name], [name] in Treatment: 0 Information Obtained From Patient Integumentary (Skin) Complaints and Symptoms: Positive for: Wounds - BLE Eyes Medical History: Positive for: Cataracts - Removed Respiratory Medical History: Positive for: Sleep Apnea - no CPAP or BIPAP Past Medical History Notes: Lung Cancer-surgery 2010 Cardiovascular Medical History: Positive for: Arrhythmia - Ballardfib; Coronary Artery Disease; Hypertension; Peripheral Venous Disease Negative for: Congestive Heart Failure Endocrine Medical History: Positive for: Type II Diabetes Time with diabetes: Since 1996 Treated with: Insulin Blood sugar tested every day: Yes Tested : Musculoskeletal Medical History: Positive for: Osteoarthritis Neurologic Medical History: Positive for: Neuropathy Oncologic Medical History: Past Medical History Notes: Lung Cancer, no radiation or chemo HBO Extended History Items Eyes: Cataracts Immunizations Pneumococcal Vaccine: Received Pneumococcal Vaccination: Yes Received Pneumococcal Vaccination On or After 60th Birthday: Yes Implantable Devices None Hospitalization / Surgery History Type of Hospitalization/Surgery 0/20/2022 cardioverison Family and Social History Cancer: Yes - Siblings; Diabetes: Yes - Siblings; Heart Disease: Yes - Mother,Father; Hereditary Spherocytosis: No; Hypertension: Yes - Father,Mother; Kidney Disease: No; Lung Disease: No; Seizures: No; Stroke: No; Thyroid Problems: No; Tuberculosis: No; Former  smoker - Quit 1986; Marital Status - Single; Alcohol Use: Never; Drug Use: No History; Caffeine Use: Moderate; Financial Concerns: No; Food, Clothing or Shelter Needs: No; Support System Lacking: No; Transportation Concerns: No Electronic Signature(s) Signed: 06/09/2022 5:46:43 PM By: Worthy Keeler PA-C Signed: 06/09/2022 5:59:54 PM By: Deon Pilling RN, BSN Entered By: Deon Pilling on 06/09/2022 10:09:21 -------------------------------------------------------------------------------- SuperBill Details Patient Name: Date of Service: FA Anise Salvo,  Beena A. 06/09/2022 Medical Record Number: [id number] Patient Account Number: [id number] Date of Birth/Sex: Treating RN: [birth year] (71 y.o. [name], Tammi Klippel Primary Care Provider: [name] Other Clinician: Referring Provider: Treating Provider/Extender: [name], [name] in Treatment: 0 Diagnosis Coding ICD-10 Codes Code Description I89.0 Lymphedema, not elsewhere classified I87.331 Chronic venous hypertension (idiopathic) with ulcer and inflammation of right lower extremity L97.812 Non-pressure chronic ulcer of other part of right lower leg with fat layer exposed E11.622 Type 2 diabetes mellitus with other skin ulcer I73.89 Other specified peripheral vascular diseases I10 Essential (primary) hypertension Z79.01 Long term (current) use of anticoagulants Facility Procedures CPT4 Code: [id number] Description: 99213 - WOUND CARE VISIT-LEV 3 EST PT Modifier: Quantity: 1 Physician Procedures : CPT4 Code Description Modifier [id number] - WC PHYS LEVEL 4 - EST PT ICD-10 Diagnosis Description I89.0 Lymphedema, not elsewhere classified I87.331 Chronic venous hypertension (idiopathic) with ulcer and inflammation of right lower extremity  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

## 2022-06-10 NOTE — Progress Notes (Signed)
0 Hearing / Language / Visual special needs _0  - 0 Assessment of Community assistance (transportation, D/C planning, etc.) _1  - 0 Additional assistance / Altered mentation _2  - 0 Support Surface(s) Assessment (bed, cushion, seat, etc.) INTERVENTIONS - Miscellaneous _3  - 0 External ear exam _4  - 0 Patient Transfer (multiple staff / Civil Service fast streamer / Similar devices) _5  - 0 Simple Staple / Suture removal (25 or less) _6  - 0 Complex Staple / Suture removal (26 or more) _7  - 0 Hypo/Hyperglycemic Management (do not check if billed separately) _8  - 0 Ankle / Brachial Index (ABI) - do not check if billed separately Has the patient been seen at the hospital within the last three years: Yes Total Score: 100 Level Of Care: New/Established - Level 3 Electronic Signature(s) Signed: 06/09/2022 5:59:54 PM By: Deon Pilling RN, BSN Entered By: Deon Pilling on 06/09/2022 10:54:35 -------------------------------------------------------------------------------- Compression Therapy Details Patient Name: Date of Service: Brandi Morgans A. 06/09/2022 9:45 A M Medical Record Number: [id number] Patient Account Number: [id number] Date of Birth/Sex: Treating RN: [birth year] (71 y.o. [name] Primary Care Marky Buresh: [name] Other Clinician: Referring Veora Fonte: Treating Setareh Rom/Extender: [name], [name] in Treatment: 0 Compression Therapy Performed for Wound Assessment: Wound #3 Right,Posterior Lower Leg Performed By: Clinician Deon Pilling, RN Compression Type: Rolena Infante Post Procedure Diagnosis Same as Pre-procedure Electronic  Signature(s) Signed: 06/09/2022 5:59:54 PM By: Deon Pilling RN, BSN Entered By: Deon Pilling on 06/09/2022 10:54:12 -------------------------------------------------------------------------------- Encounter Discharge Information Details Patient Name: Date of Service: Brandi Morgans A. 06/09/2022 9:45 A M Medical Record Number: [id number] Patient Account Number: [id number] Date of Birth/Sex: Treating RN: [birth year] (71 y.o. [name] Primary Care Ory Elting: [name] Other Clinician: Referring Amandalee Lacap: Treating Chadrick Sprinkle/Extender: [name], [name] in Treatment: 0 Encounter Discharge Information Items Discharge Condition: Stable Ambulatory Status: Wheelchair Discharge Destination: Home Transportation: Private Auto Accompanied By: self Schedule Follow-up Appointment: Yes Clinical Summary of Care: Electronic Signature(s) Signed: 06/09/2022 5:59:54 PM By: Deon Pilling RN, BSN Entered By: Deon Pilling on 06/09/2022 10:54:59 -------------------------------------------------------------------------------- Lower Extremity Assessment Details Patient Name: Date of Service: Brandi Morgans A. 06/09/2022 9:45 A M Medical Record Number: [id number] Patient Account Number: [id number] Date of Birth/Sex: Treating RN: December 09, 1950 (71 y.o. [name], [name] Primary Care Claretta Kendra: Other Clinician: [name] Referring Sheppard Luckenbach: Treating Walburga Hudman/Extender: [name], [name] in Treatment: 0 Edema Assessment Assessed: [Left: Yes] [Right: Yes] Edema: [Left: Yes] [Right: Yes] Calf Left: Right: Point of Measurement: 34.4 cm From Medial Instep 55.5 cm 57 cm Ankle Left: Right: Point of Measurement: 10 cm From Medial Instep 29.4 cm 28 cm Knee To Floor Left: Right: From Medial Instep 40 cm 40 cm Vascular Assessment Pulses: Dorsalis Pedis Palpable: [Left:Yes] [Right:Yes] Doppler Audible: [Left:Yes]  [Right:Yes] Posterior Tibial Palpable: [Left:Yes Yes] [Right:No Inaudible] Electronic Signature(s) Signed: 06/09/2022 5:59:54 PM By: Deon Pilling RN, BSN Entered By: Deon Pilling on 06/09/2022 10:17:42 -------------------------------------------------------------------------------- Kay Details Patient Name: Date of Service: Brandi Morgans A. 06/09/2022 9:45 A M Medical Record Number: [id number] Patient Account Number: [id number] Date of Birth/Sex: Treating RN: August 14, 1951 (71 y.o. [name] Primary Care Minh Jasper: [name] Other Clinician: Referring Eleonore Shippee: Treating Jhaniya Briski/Extender: [name], [name] in Treatment: 0 Active Inactive Orientation to the Wound Care Program Nursing Diagnoses: Knowledge deficit related to the wound healing center program Goals: Patient/caregiver will verbalize understanding of the [facility] Date Initiated: 06/09/2022 Target Resolution Date: 07/02/2022  cm) 6.362 Volume: (cm) 0.636 % Reduction in Area: 0% % Reduction in Volume:  0% Epithelialization: Small (1-33%) Tunneling: No Undermining: No Wound Description Classification: Full Thickness Without Exposed Support Structures Wound Margin: Distinct, outline attached Exudate Amount: Medium Exudate Type: Serosanguineous Exudate Color: red, brown Foul Odor After Cleansing: No Slough/Fibrino No Wound Bed Granulation Amount: Large (67-100%) Exposed Structure Granulation Quality: Red, Pink Fascia Exposed: No Necrotic Amount: None Present (0%) Fat Layer (Subcutaneous Tissue) Exposed: Yes Tendon Exposed: No Muscle Exposed: No Joint Exposed: No Bone Exposed: No Treatment Notes Wound #3 (Lower Leg) Wound Laterality: Right, Posterior Cleanser Soap and Water Discharge Instruction: May shower and wash wound with dial antibacterial soap and water prior to dressing change. Peri-Wound Care Triamcinolone 15 (g) Discharge Instruction: Use triamcinolone 15 (g) as directed Sween Lotion (Moisturizing lotion) Discharge Instruction: Apply moisturizing lotion as directed Topical Primary Dressing KerraCel Ag Gelling Fiber Dressing, 4x5 in (silver alginate) Discharge Instruction: Apply silver alginate to wound bed as instructed Secondary Dressing ABD Pad, 8x10 Discharge Instruction: Apply over primary dressing as directed. Secured With Compression Wrap Unnaboot w/Calamine, 4x10 (in/yd) Discharge Instruction: Apply Unnaboot as directed. Compression Stockings Add-Ons Electronic Signature(s) Signed: 06/09/2022 5:59:54 PM By: Deon Pilling RN, BSN Signed: 06/10/2022 4:42:06 PM By: Erenest Blank Entered By: Erenest Blank on 06/09/2022 10:19:54 -------------------------------------------------------------------------------- Vitals Details Patient Name: Date of Service: Brandi Morgans A. 06/09/2022 9:45 A M Medical Record Number: [id number] Patient Account Number: [id number] Date of Birth/Sex: Treating RN: [birth year] (71 y.o. [name], [name] Primary Care Orpah Hausner:  [name] Other Clinician: Referring Shadeed Colberg: Treating Keltie Labell/Extender: [name], [name] in Treatment: 0 Vital Signs Time Taken: 10:00 Temperature (F): 98.3 Height (in): 63 Pulse (bpm): 109 Source: Stated Respiratory Rate (breaths/min): 20 Weight (lbs): 284 Blood Pressure (mmHg): 165/85 Source: Stated Capillary Blood Glucose (mg/dl): 135 Body Mass Index (BMI): 50.3 Reference Range: 80 - 120 mg / dl Electronic Signature(s) Signed: 06/09/2022 5:59:54 PM By: Deon Pilling RN, BSN Entered By: Deon Pilling on 06/09/2022 10:03:05  cm) 6.362 Volume: (cm) 0.636 % Reduction in Area: 0% % Reduction in Volume:  0% Epithelialization: Small (1-33%) Tunneling: No Undermining: No Wound Description Classification: Full Thickness Without Exposed Support Structures Wound Margin: Distinct, outline attached Exudate Amount: Medium Exudate Type: Serosanguineous Exudate Color: red, brown Foul Odor After Cleansing: No Slough/Fibrino No Wound Bed Granulation Amount: Large (67-100%) Exposed Structure Granulation Quality: Red, Pink Fascia Exposed: No Necrotic Amount: None Present (0%) Fat Layer (Subcutaneous Tissue) Exposed: Yes Tendon Exposed: No Muscle Exposed: No Joint Exposed: No Bone Exposed: No Treatment Notes Wound #3 (Lower Leg) Wound Laterality: Right, Posterior Cleanser Soap and Water Discharge Instruction: May shower and wash wound with dial antibacterial soap and water prior to dressing change. Peri-Wound Care Triamcinolone 15 (g) Discharge Instruction: Use triamcinolone 15 (g) as directed Sween Lotion (Moisturizing lotion) Discharge Instruction: Apply moisturizing lotion as directed Topical Primary Dressing KerraCel Ag Gelling Fiber Dressing, 4x5 in (silver alginate) Discharge Instruction: Apply silver alginate to wound bed as instructed Secondary Dressing ABD Pad, 8x10 Discharge Instruction: Apply over primary dressing as directed. Secured With Compression Wrap Unnaboot w/Calamine, 4x10 (in/yd) Discharge Instruction: Apply Unnaboot as directed. Compression Stockings Add-Ons Electronic Signature(s) Signed: 06/09/2022 5:59:54 PM By: Deon Pilling RN, BSN Signed: 06/10/2022 4:42:06 PM By: Erenest Blank Entered By: Erenest Blank on 06/09/2022 10:19:54 -------------------------------------------------------------------------------- Vitals Details Patient Name: Date of Service: Brandi Morgans A. 06/09/2022 9:45 A M Medical Record Number: [id number] Patient Account Number: [id number] Date of Birth/Sex: Treating RN: [birth year] (71 y.o. [name], [name] Primary Care Orpah Hausner:  [name] Other Clinician: Referring Shadeed Colberg: Treating Keltie Labell/Extender: [name], [name] in Treatment: 0 Vital Signs Time Taken: 10:00 Temperature (F): 98.3 Height (in): 63 Pulse (bpm): 109 Source: Stated Respiratory Rate (breaths/min): 20 Weight (lbs): 284 Blood Pressure (mmHg): 165/85 Source: Stated Capillary Blood Glucose (mg/dl): 135 Body Mass Index (BMI): 50.3 Reference Range: 80 - 120 mg / dl Electronic Signature(s) Signed: 06/09/2022 5:59:54 PM By: Deon Pilling RN, BSN Entered By: Deon Pilling on 06/09/2022 10:03:05  cm) 6.362 Volume: (cm) 0.636 % Reduction in Area: 0% % Reduction in Volume:  0% Epithelialization: Small (1-33%) Tunneling: No Undermining: No Wound Description Classification: Full Thickness Without Exposed Support Structures Wound Margin: Distinct, outline attached Exudate Amount: Medium Exudate Type: Serosanguineous Exudate Color: red, brown Foul Odor After Cleansing: No Slough/Fibrino No Wound Bed Granulation Amount: Large (67-100%) Exposed Structure Granulation Quality: Red, Pink Fascia Exposed: No Necrotic Amount: None Present (0%) Fat Layer (Subcutaneous Tissue) Exposed: Yes Tendon Exposed: No Muscle Exposed: No Joint Exposed: No Bone Exposed: No Treatment Notes Wound #3 (Lower Leg) Wound Laterality: Right, Posterior Cleanser Soap and Water Discharge Instruction: May shower and wash wound with dial antibacterial soap and water prior to dressing change. Peri-Wound Care Triamcinolone 15 (g) Discharge Instruction: Use triamcinolone 15 (g) as directed Sween Lotion (Moisturizing lotion) Discharge Instruction: Apply moisturizing lotion as directed Topical Primary Dressing KerraCel Ag Gelling Fiber Dressing, 4x5 in (silver alginate) Discharge Instruction: Apply silver alginate to wound bed as instructed Secondary Dressing ABD Pad, 8x10 Discharge Instruction: Apply over primary dressing as directed. Secured With Compression Wrap Unnaboot w/Calamine, 4x10 (in/yd) Discharge Instruction: Apply Unnaboot as directed. Compression Stockings Add-Ons Electronic Signature(s) Signed: 06/09/2022 5:59:54 PM By: Deon Pilling RN, BSN Signed: 06/10/2022 4:42:06 PM By: Erenest Blank Entered By: Erenest Blank on 06/09/2022 10:19:54 -------------------------------------------------------------------------------- Vitals Details Patient Name: Date of Service: Brandi Morgans A. 06/09/2022 9:45 A M Medical Record Number: [id number] Patient Account Number: [id number] Date of Birth/Sex: Treating RN: [birth year] (71 y.o. [name], [name] Primary Care Orpah Hausner:  [name] Other Clinician: Referring Shadeed Colberg: Treating Keltie Labell/Extender: [name], [name] in Treatment: 0 Vital Signs Time Taken: 10:00 Temperature (F): 98.3 Height (in): 63 Pulse (bpm): 109 Source: Stated Respiratory Rate (breaths/min): 20 Weight (lbs): 284 Blood Pressure (mmHg): 165/85 Source: Stated Capillary Blood Glucose (mg/dl): 135 Body Mass Index (BMI): 50.3 Reference Range: 80 - 120 mg / dl Electronic Signature(s) Signed: 06/09/2022 5:59:54 PM By: Deon Pilling RN, BSN Entered By: Deon Pilling on 06/09/2022 10:03:05

## 2022-06-16 ENCOUNTER — Encounter (HOSPITAL_BASED_OUTPATIENT_CLINIC_OR_DEPARTMENT_OTHER): Payer: HMO | Admitting: Physician Assistant

## 2022-06-16 DIAGNOSIS — L97812 Non-pressure chronic ulcer of other part of right lower leg with fat layer exposed: Secondary | ICD-10-CM | POA: Diagnosis not present

## 2022-06-16 DIAGNOSIS — I890 Lymphedema, not elsewhere classified: Secondary | ICD-10-CM | POA: Diagnosis not present

## 2022-06-16 DIAGNOSIS — E11622 Type 2 diabetes mellitus with other skin ulcer: Secondary | ICD-10-CM | POA: Diagnosis not present

## 2022-06-16 NOTE — Progress Notes (Signed)
Nursing Diagnoses: Pain, acute or chronic: actual or potential Potential alteration in comfort, pain Goals: Patient will verbalize adequate pain control and receive pain control interventions during procedures as needed Date Initiated: 06/09/2022 Target Resolution Date: 07/02/2022 Goal Status: Active Patient/caregiver will verbalize comfort level met Date Initiated: 06/09/2022 Target Resolution Date: 07/02/2022 Goal Status: Active Interventions: Encourage patient to take pain medications as prescribed Provide education on pain management Reposition patient for comfort Treatment Activities: Administer pain control measures as ordered : 06/09/2022 Notes: Wound/Skin Impairment Nursing Diagnoses: Knowledge deficit related to ulceration/compromised skin integrity Goals: Patient/caregiver will verbalize understanding of skin care regimen Date Initiated: 06/09/2022 Target Resolution Date: 07/01/2022 Goal Status: Active Interventions: Assess patient/caregiver ability to obtain necessary supplies Assess patient/caregiver ability to perform ulcer/skin care regimen upon admission and as needed Assess ulceration(s) every  visit Provide education on ulcer and skin care Treatment Activities: Skin care regimen initiated : 06/09/2022 Topical wound management initiated : 06/09/2022 Notes: Electronic Signature(s) Signed: 06/16/2022 5:32:30 PM By: Deon Pilling RN, BSN Entered By: Deon Pilling on 06/16/2022 13:51:13 -------------------------------------------------------------------------------- Pain Assessment Details Patient Name: Date of Service: [name] A. 06/16/2022 1:30 PM Medical Record Number: [id number] Patient Account Number: [id number] Date of Birth/Sex: Treating RN: 1951-08-18 (71 y.o. [name] Primary Care Onedia Vargus: [name] Other Clinician: Referring [name]: Treating [name]/Extender: [name], [name] in Treatment: 1 Active Problems Location of Pain Severity and Description of Pain Patient Has Paino No Site Locations Rate the pain. Current Pain Level: 0 Pain Management and Medication Current Pain Management: Medication: No Cold Application: No Rest: No Massage: No Activity: No T.E.N.S.: No Heat Application: No Leg drop or elevation: No Is the Current Pain Management Adequate: Adequate How does your wound impact your activities of daily livingo Sleep: No Bathing: No Appetite: No Relationship With Others: No Bladder Continence: No Emotions: No Bowel Continence: No Work: No Toileting: No Drive: No Dressing: No Hobbies: No Engineer, maintenance) Signed: 06/16/2022 5:32:30 PM By: Deon Pilling RN, BSN Entered By: Deon Pilling on 06/16/2022 13:47:32 -------------------------------------------------------------------------------- Patient/Caregiver Education Details Patient Name: Date of Service: [name] 9/20/2023andnbsp1:30 PM Medical Record Number: [id number] Patient Account Number: [id number] Date of Birth/Gender: Treating RN: 08/30/1951 (71 y.o. [name] Primary Care Physician: [name] Other Clinician: Referring Physician: Treating Physician/Extender: [name] in Treatment: 1 Education Assessment Education Provided To: Patient Education Topics Provided Pain: Handouts: A Guide to Pain Control Methods: Explain/Verbal Responses: Reinforcements needed Electronic Signature(s) Signed: 06/16/2022 5:32:30 PM By: Deon Pilling RN, BSN Entered By: Deon Pilling on 06/16/2022 13:51:27 -------------------------------------------------------------------------------- Wound Assessment Details Patient Name: Date of Service: [name] A. 06/16/2022 1:30 PM Medical Record Number: [id number] Patient Account Number: [id number] Date of Birth/Sex: Treating RN: Oct 30, 1950 (71 y.o. [name], [name] Primary Care [name]: [name] Other Clinician: Referring Geremy Rister: Treating Jonika Critz/Extender: [name], [name] in Treatment: 1 Wound Status Wound Number: 3 Primary Lymphedema Etiology: Wound Location: Right, Posterior Lower Leg Wound Open Wounding Event: Blister Status: Date Acquired: 05/26/2022 Comorbid Cataracts, Sleep Apnea, Arrhythmia, Coronary Artery Disease, Weeks Of Treatment: 1 History: Hypertension, Peripheral Venous Disease, Type II Diabetes, Clustered Wound: Yes Osteoarthritis, Neuropathy Photos Wound Measurements Length: (cm) 0.8 Width: (cm) 2.8 Depth: (cm) 0.1 Clustered Quantity: 2 Area: (cm) 1.759 Volume: (cm) 0.176 % Reduction in Area: 72.4% % Reduction in Volume: 72.3% Epithelialization: Medium (34-66%) Tunneling: No Undermining: No Wound Description Classification: Full Thickness Without Exposed Support Structures Wound Margin: Distinct,  LEONETTE, TISCHER ([id number]) Visit Report for 06/16/2022 Arrival Information Details Patient Name: Date of Service: [name], [name] A. 06/16/2022 1:30 PM Medical Record Number: [id number] Patient Account Number: [id number] Date of Birth/Sex: Treating RN: [birth year] (71 y.o. [name], [name] Primary Care Jakaria Lavergne: [name] Other Clinician: Referring [name]: Treating [name]/Extender: [name] in Treatment: 1 Visit Information History Since Last Visit Added or deleted any medications: No Patient Arrived: Wheel Chair Any new allergies or adverse reactions: No Arrival Time: 13:46 Had a fall or experienced change in No Accompanied By: self activities of daily living that may affect Transfer Assistance: Manual risk of falls: Patient Identification Verified: Yes Signs or symptoms of abuse/neglect since last visito No Secondary Verification Process Completed: Yes Hospitalized since last visit: No Patient Requires Transmission-Based No Implantable device outside of the clinic excluding No Precautions: cellular tissue based products placed in the center Patient Has Alerts: Yes since last visit: Patient Alerts: [facility]VS ABI LandR  10/23/21 Has Dressing in Place as Prescribed: Yes TBI L 0.29 R 0.28 Has Compression in Place as Prescribed: Yes Pain Present Now: No Electronic Signature(s) Signed: 06/16/2022 5:32:30 PM By: Deon Pilling RN, BSN Entered By: Deon Pilling on 06/16/2022 13:47:23 -------------------------------------------------------------------------------- Compression Therapy Details Patient Name: Date of Service: [name] A. 06/16/2022 1:30 PM Medical Record Number: [id number] Patient Account Number: [id number] Date of Birth/Sex: Treating RN: November 03, 1950 (71 y.o. [name] Primary Care Broady Lafoy: [name] Other Clinician: Referring Bronislaus Verdell: Treating Lorely Bubb/Extender: [name],  [name] in Treatment: 1 Compression Therapy Performed for Wound Assessment: Wound #3 Right,Posterior Lower Leg Performed By: Clinician Deon Pilling, RN Compression Type: Rolena Infante Post Procedure Diagnosis Same as Pre-procedure Electronic Signature(s) Signed: 06/16/2022 5:32:30 PM By: Deon Pilling RN, BSN Entered By: Deon Pilling on 06/16/2022 14:05:34 -------------------------------------------------------------------------------- Encounter Discharge Information Details Patient Name: Date of Service: [name] A. 06/16/2022 1:30 PM Medical Record Number: [id number] Patient Account Number: [id number] Date of Birth/Sex: Treating RN: Mar 23, 1951 (71 y.o. [name] Primary Care Tarus Briski: [name] Other Clinician: Referring Jackelyn Illingworth: Treating Viha Kriegel/Extender: [name] in Treatment: 1 Encounter Discharge Information Items Discharge Condition: Stable Ambulatory Status: Wheelchair Discharge Destination: Home Transportation: Private Auto Accompanied By: self Schedule Follow-up Appointment: Yes Clinical Summary of Care: Electronic Signature(s) Signed: 06/16/2022 5:32:30 PM By: Deon Pilling RN, BSN Entered By: Deon Pilling on 06/16/2022 14:07:06 -------------------------------------------------------------------------------- Lower Extremity Assessment Details Patient Name: Date of Service: [name] A. 06/16/2022 1:30 PM Medical Record Number: [id number] Patient Account Number: [id number] Date of Birth/Sex: Treating RN: May 18, 1951 (71 y.o. [name] Primary Care Nova Schmuhl: [name] Other Clinician: Referring Phinley Schall: Treating Brighton Delio/Extender: [name], [name] in Treatment: 1 Edema Assessment Assessed: [Left: No] [Right: Yes] Edema: [Left: Yes] [Right: Yes] Calf Left: Right: Point of Measurement: 34.4 cm From Medial Instep 55 cm Ankle Left: Right: Point of  Measurement: 10 cm From Medial Instep 27 cm Vascular Assessment Pulses: Dorsalis Pedis Palpable: [Right:Yes] Electronic Signature(s) Signed: 06/16/2022 5:32:30 PM By: Deon Pilling RN, BSN Entered By: Deon Pilling on 06/16/2022 13:56:39 -------------------------------------------------------------------------------- [place] Details Patient Name: Date of Service: [name] A. 06/16/2022 1:30 PM Medical Record Number: [id number] Patient Account Number: [id number] Date of Birth/Sex: Treating RN: 11/16/1950 (71 y.o. [name] Primary Care Arizbeth Cawthorn: [name] Other Clinician: Referring Tajee Savant: Treating Margeaux Swantek/Extender: [name], [name] in Treatment: 1 Active Inactive Pain, Acute or Chronic  LEONETTE, TISCHER ([id number]) Visit Report for 06/16/2022 Arrival Information Details Patient Name: Date of Service: [name], [name] A. 06/16/2022 1:30 PM Medical Record Number: [id number] Patient Account Number: [id number] Date of Birth/Sex: Treating RN: [birth year] (71 y.o. [name], [name] Primary Care Jakaria Lavergne: [name] Other Clinician: Referring [name]: Treating [name]/Extender: [name] in Treatment: 1 Visit Information History Since Last Visit Added or deleted any medications: No Patient Arrived: Wheel Chair Any new allergies or adverse reactions: No Arrival Time: 13:46 Had a fall or experienced change in No Accompanied By: self activities of daily living that may affect Transfer Assistance: Manual risk of falls: Patient Identification Verified: Yes Signs or symptoms of abuse/neglect since last visito No Secondary Verification Process Completed: Yes Hospitalized since last visit: No Patient Requires Transmission-Based No Implantable device outside of the clinic excluding No Precautions: cellular tissue based products placed in the center Patient Has Alerts: Yes since last visit: Patient Alerts: VVS ABI LandR  10/23/21 Has Dressing in Place as Prescribed: Yes TBI L 0.29 R 0.28 Has Compression in Place as Prescribed: Yes Pain Present Now: No Electronic Signature(s) Signed: 06/16/2022 5:32:30 PM By: Deon Pilling RN, BSN Entered By: Deon Pilling on 06/16/2022 13:47:23 -------------------------------------------------------------------------------- Compression Therapy Details Patient Name: Date of Service: [name] A. 06/16/2022 1:30 PM Medical Record Number: [id number] Patient Account Number: [id number] Date of Birth/Sex: Treating RN: November 03, 1950 (71 y.o. [name] Primary Care Broady Lafoy: [name] Other Clinician: Referring Bronislaus Verdell: Treating Lorely Bubb/Extender: [name],  [name] in Treatment: 1 Compression Therapy Performed for Wound Assessment: Wound #3 Right,Posterior Lower Leg Performed By: Clinician Deon Pilling, RN Compression Type: Rolena Infante Post Procedure Diagnosis Same as Pre-procedure Electronic Signature(s) Signed: 06/16/2022 5:32:30 PM By: Deon Pilling RN, BSN Entered By: Deon Pilling on 06/16/2022 14:05:34 -------------------------------------------------------------------------------- Encounter Discharge Information Details Patient Name: Date of Service: [name] A. 06/16/2022 1:30 PM Medical Record Number: [id number] Patient Account Number: [id number] Date of Birth/Sex: Treating RN: Mar 23, 1951 (71 y.o. [name] Primary Care Tarus Briski: [name] Other Clinician: Referring Jackelyn Illingworth: Treating Viha Kriegel/Extender: [name] in Treatment: 1 Encounter Discharge Information Items Discharge Condition: Stable Ambulatory Status: Wheelchair Discharge Destination: Home Transportation: Private Auto Accompanied By: self Schedule Follow-up Appointment: Yes Clinical Summary of Care: Electronic Signature(s) Signed: 06/16/2022 5:32:30 PM By: Deon Pilling RN, BSN Entered By: Deon Pilling on 06/16/2022 14:07:06 -------------------------------------------------------------------------------- Lower Extremity Assessment Details Patient Name: Date of Service: [name] A. 06/16/2022 1:30 PM Medical Record Number: [id number] Patient Account Number: [id number] Date of Birth/Sex: Treating RN: May 18, 1951 (71 y.o. [name] Primary Care Nova Schmuhl: [name] Other Clinician: Referring Phinley Schall: Treating Brighton Delio/Extender: [name], [name] in Treatment: 1 Edema Assessment Assessed: [Left: No] [Right: Yes] Edema: [Left: Yes] [Right: Yes] Calf Left: Right: Point of Measurement: 34.4 cm From Medial Instep 55 cm Ankle Left: Right: Point of  Measurement: 10 cm From Medial Instep 27 cm Vascular Assessment Pulses: Dorsalis Pedis Palpable: [Right:Yes] Electronic Signature(s) Signed: 06/16/2022 5:32:30 PM By: Deon Pilling RN, BSN Entered By: Deon Pilling on 06/16/2022 13:56:39 -------------------------------------------------------------------------------- [place] Details Patient Name: Date of Service: [name] A. 06/16/2022 1:30 PM Medical Record Number: [id number] Patient Account Number: [id number] Date of Birth/Sex: Treating RN: 11/16/1950 (71 y.o. [name] Primary Care Arizbeth Cawthorn: [name] Other Clinician: Referring Tajee Savant: Treating Margeaux Swantek/Extender: [name], [name] in Treatment: 1 Active Inactive Pain, Acute or Chronic

## 2022-06-16 NOTE — Progress Notes (Addendum)
[name] ([id number]) Visit Report for 06/16/2022 Chief Complaint Document Details Patient Name: Date of Service: [name], [name] 06/16/2022 1:30 PM Medical Record Number: [id number] Patient Account Number: [id number] Date of Birth/Sex: Treating RN: 12-02-1950 (71 y.o. [name] Primary Care Provider: [name] Other Clinician: Referring Provider: Treating Provider/Extender: [name], [name] in Treatment: 1 Information Obtained from: Patient Chief Complaint Right LE Ulcers Electronic Signature(s) Signed: 06/16/2022 1:29:50 PM By: Worthy Keeler PA-C Entered By: Worthy Keeler on 06/16/2022 13:29:50 -------------------------------------------------------------------------------- HPI Details Patient Name: Date of Service: Brandi Morgans A. 06/16/2022 1:30 PM Medical Record Number: [id number] Patient Account Number: [id number] Date of Birth/Sex: Treating RN: 1951/08/01 (71 y.o. [name] Primary Care Provider: [name] Other Clinician: Referring Provider: Treating Provider/Extender: Angelica Pou in Treatment: 1 History of Present Illness HPI Description: 08/05/2021 upon evaluation today patient has bilateral wounds on her lower extremities. She has been tolerating the dressing changes without complication at home but unfortunately is not really seeing signs of improvement like she would like to see. She does have what appears to be bilateral lymphedema due to chronic venous insufficiency most likely. She is on fluid pills and takes them regularly. She really does not sit with her legs elevated much she has a recliner but again does not use that often. I think she should be using this more to get her feet up. Subsequently she tells me this has been going on for several weeks to even months. She finally decided to make a referral herself to come in to be seen. Patient does have a  history of diabetes mellitus type 2, some evidence of potential peripheral vascular disease as she is noncompressible with her ABIs in the office today, hypertension, and she is on long-term anticoagulant therapy. 08/12/2021 upon evaluation today patient's wounds appear to be doing well in regard to her legs currently. Actually very pleased with where things stand and I do not see any signs of active infection which is great news. No fevers, chills, nausea, vomiting, or diarrhea. 08/19/2021 upon evaluation today patient appears to be doing well with regard to her legs. Everything actually appears to be healing quite nicely with doing extremely well. Fortunately there does not appear to be any signs of active infection which is great news. Readmission: 06-09-2022 upon evaluation today patient appears for reevaluation in the clinic concerning issues that she has been having with her leg. This is actually her right leg at this time though when I saw her previously was both legs. Fortunately there does not appear to be any evidence of active infection at this time which is great news. No fevers, chills, nausea, vomiting, or diarrhea. Her past medical history really has not changed significantly since last time I saw her. I did discuss with her today how she has been doing with wearing her compression socks she tells me she really has not been wearing those because "she cannot get them on her son is not able to help. She tells me that he works at night and by the time he comes and goes to bed she is not up yet and then she wakes up later in the past do not cross. I talked her today about actually having a time later in the day when he could help with getting the socks on and that is a possibility. With that being said she currently states that she had not thought through that  completely I am not sure if that something she has been worked out or not. 06-16-2022 upon evaluation today patient appears to be  doing well currently in regard to her wounds all things considered. I am seeing excellent signs of improvement I think were getting very close to complete resolution which is great news and overall I do not see anything that seems to be worsening significantly also great news. In general I think we are on the right track here. She does have a follow-up with vein and vascular next Friday. Electronic Signature(s) Signed: 06/16/2022 5:35:27 PM By: Worthy Keeler PA-C Entered By: Worthy Keeler on 06/16/2022 17:35:26 -------------------------------------------------------------------------------- Physical Exam Details Patient Name: Date of Service: [name], [name] A. 06/16/2022 1:30 PM Medical Record Number: [id number] Patient Account Number: [id number] Date of Birth/Sex: Treating RN: [birth year] (71 y.o. [name] Primary Care Provider: [name] Other Clinician: Referring Provider: Treating Provider/Extender: [name], [name] in Treatment: 1 Constitutional Well-nourished and well-hydrated in no acute distress. Respiratory normal breathing without difficulty. Psychiatric this patient is able to make decisions and demonstrates good insight into disease process. Alert and Oriented x 3. pleasant and cooperative. Notes Upon inspection patient's wound bed actually showed signs of good granulation and epithelization at this point. Fortunately I am actually very pleased with where we stand and I think that she is doing well with Unna boot wrap and my suggestion would be that we continue as such. Electronic Signature(s) Signed: 06/16/2022 5:38:02 PM By: Worthy Keeler PA-C Entered By: Worthy Keeler on 06/16/2022 17:38:02 -------------------------------------------------------------------------------- Physician Orders Details Patient Name: Date of Service: Brandi Morgans A. 06/16/2022 1:30 PM Medical Record Number: [id number] Patient Account  Number: [id number] Date of Birth/Sex: Treating RN: Feb 23, 1951 (71 y.o. [name], Tammi Klippel Primary Care Provider: [name] Other Clinician: Referring Provider: Treating Provider/Extender: Angelica Pou in Treatment: 1 Verbal / Phone Orders: No Diagnosis Coding ICD-10 Coding Code Description I89.0 Lymphedema, not elsewhere classified I87.331 Chronic venous hypertension (idiopathic) with ulcer and inflammation of right lower extremity L97.812 Non-pressure chronic ulcer of other part of right lower leg with fat layer exposed E11.622 Type 2 diabetes mellitus with other skin ulcer I73.89 Other specified peripheral vascular diseases I10 Essential (primary) hypertension Z79.01 Long term (current) use of anticoagulants Follow-up Appointments ppointment in 1 week. Jeri Cos, PA and [place], Room 8 Wednesday Return A Other: - Follow up with Vein and Vascular on 06/25/2022 Anesthetic (In clinic) Topical Lidocaine 5% applied to wound bed Bathing/ Shower/ Hygiene May shower with protection but do not get wound dressing(s) wet. Edema Control - Lymphedema / SCD / Other Elevate legs to the level of the heart or above for 30 minutes daily and/or when sitting, a frequency of: - 3-4 times a day throughout the day ensure to elevate legs. Avoid standing for long periods of time. Exercise regularly Moisturize legs daily. - need lotion to left leg at night. Apply Tubigrip size E in clinic and may use under the juxtalite HD if unable to get socks on. Compression stocking or Garment 20-30 mm/Hg pressure to: - Patient to work on wearing the juxtalite HD to left leg. Apply daily. Wound Treatment Wound #3 - Lower Leg Wound Laterality: Right, Posterior Cleanser: Soap and Water 1 x Per Week/30 Days Discharge Instructions: May shower and wash wound with dial antibacterial soap and water prior to dressing change. Peri-Wound Care: Triamcinolone 15 (g) 1 x Per Week/30  Days Discharge  Instructions: Use triamcinolone 15 (g) as directed Peri-Wound Care: Sween Lotion (Moisturizing lotion) 1 x Per Week/30 Days Discharge Instructions: Apply moisturizing lotion as directed Prim Dressing: KerraCel Ag Gelling Fiber Dressing, 4x5 in (silver alginate) 1 x Per Week/30 Days ary Discharge Instructions: Apply silver alginate to wound bed as instructed Secondary Dressing: ABD Pad, 8x10 1 x Per Week/30 Days Discharge Instructions: Apply over primary dressing as directed. Compression Wrap: Unnaboot w/Calamine, 4x10 (in/yd) 1 x Per Week/30 Days Discharge Instructions: Apply Unnaboot as directed. Electronic Signature(s) Signed: 06/16/2022 5:32:30 PM By: Deon Pilling RN, BSN Signed: 06/16/2022 5:51:27 PM By: Worthy Keeler PA-C Entered By: Deon Pilling on 06/16/2022 14:06:32 -------------------------------------------------------------------------------- Problem List Details Patient Name: Date of Service: Brandi Morgans A. 06/16/2022 1:30 PM Medical Record Number: [id number] Patient Account Number: [id number] Date of Birth/Sex: Treating RN: 11/02/1950 (71 y.o. [name], Tammi Klippel Primary Care Provider: [name] Other Clinician: Referring Provider: Treating Provider/Extender: Angelica Pou in Treatment: 1 Active Problems ICD-10 Encounter Code Description Active Date MDM Diagnosis I89.0 Lymphedema, not elsewhere classified 06/09/2022 No Yes I87.331 Chronic venous hypertension (idiopathic) with ulcer and inflammation of right 06/09/2022 No Yes lower extremity L97.812 Non-pressure chronic ulcer of other part of right lower leg with fat layer 06/09/2022 No Yes exposed E11.622 Type 2 diabetes mellitus with other skin ulcer 06/09/2022 No Yes I73.89 Other specified peripheral vascular diseases 06/09/2022 No Yes I10 Essential (primary) hypertension 06/09/2022 No Yes Z79.01 Long term (current) use of anticoagulants 06/09/2022 No  Yes Inactive Problems Resolved Problems Electronic Signature(s) Signed: 06/16/2022 1:29:45 PM By: Worthy Keeler PA-C Entered By: Worthy Keeler on 06/16/2022 13:29:45 -------------------------------------------------------------------------------- Progress Note Details Patient Name: Date of Service: Brandi Morgans A. 06/16/2022 1:30 PM Medical Record Number: [id number] Patient Account Number: [id number] Date of Birth/Sex: Treating RN: Sep 26, 1951 (71 y.o. [name] Primary Care Provider: [name] Other Clinician: Referring Provider: Treating Provider/Extender: Angelica Pou in Treatment: 1 Subjective Chief Complaint Information obtained from Patient Right LE Ulcers History of Present Illness (HPI) 08/05/2021 upon evaluation today patient has bilateral wounds on her lower extremities. She has been tolerating the dressing changes without complication at home but unfortunately is not really seeing signs of improvement like she would like to see. She does have what appears to be bilateral lymphedema due to chronic venous insufficiency most likely. She is on fluid pills and takes them regularly. She really does not sit with her legs elevated much she has a recliner but again does not use that often. I think she should be using this more to get her feet up. Subsequently she tells me this has been going on for several weeks to even months. She finally decided to make a referral herself to come in to be seen. Patient does have a history of diabetes mellitus type 2, some evidence of potential peripheral vascular disease as she is noncompressible with her ABIs in the office today, hypertension, and she is on long-term anticoagulant therapy. 08/12/2021 upon evaluation today patient's wounds appear to be doing well in regard to her legs currently. Actually very pleased with where things stand and I do not see any signs of active infection which is  great news. No fevers, chills, nausea, vomiting, or diarrhea. 08/19/2021 upon evaluation today patient appears to be doing well with regard to her legs. Everything actually appears to be healing quite nicely with doing extremely well. Fortunately there does not appear to be any signs of  active infection which is great news. Readmission: 06-09-2022 upon evaluation today patient appears for reevaluation in the clinic concerning issues that she has been having with her leg. This is actually her right leg at this time though when I saw her previously was both legs. Fortunately there does not appear to be any evidence of active infection at this time which is great news. No fevers, chills, nausea, vomiting, or diarrhea. Her past medical history really has not changed significantly since last time I saw her. I did discuss with her today how she has been doing with wearing her compression socks she tells me she really has not been wearing those because "she cannot get them on her son is not able to help. She tells me that he works at night and by the time he comes and goes to bed she is not up yet and then she wakes up later in the past do not cross. I talked her today about actually having a time later in the day when he could help with getting the socks on and that is a possibility. With that being said she currently states that she had not thought through that completely I am not sure if that something she has been worked out or not. 06-16-2022 upon evaluation today patient appears to be doing well currently in regard to her wounds all things considered. I am seeing excellent signs of improvement I think were getting very close to complete resolution which is great news and overall I do not see anything that seems to be worsening significantly also great news. In general I think we are on the right track here. She does have a follow-up with vein and vascular next  Friday. Objective Constitutional Well-nourished and well-hydrated in no acute distress. Vitals Time Taken: 1:45 PM, Height: 63 in, Weight: 284 lbs, BMI: 50.3, Temperature: 98.2 F, Pulse: 90 bpm, Respiratory Rate: 20 breaths/min, Blood Pressure: 155/77 mmHg, Capillary Blood Glucose: 168 mg/dl. Respiratory normal breathing without difficulty. Psychiatric this patient is able to make decisions and demonstrates good insight into disease process. Alert and Oriented x 3. pleasant and cooperative. General Notes: Upon inspection patient's wound bed actually showed signs of good granulation and epithelization at this point. Fortunately I am actually very pleased with where we stand and I think that she is doing well with Unna boot wrap and my suggestion would be that we continue as such. Integumentary (Hair, Skin) Wound #3 status is Open. Original cause of wound was Blister. The date acquired was: 05/26/2022. The wound has been in treatment 1 weeks. The wound is located on the Right,Posterior Lower Leg. The wound measures 0.8cm length x 2.8cm width x 0.1cm depth; 1.759cm^2 area and 0.176cm^3 volume. There is Fat Layer (Subcutaneous Tissue) exposed. There is no tunneling or undermining noted. There is a medium amount of serosanguineous drainage noted. The wound margin is distinct with the outline attached to the wound base. There is large (67-100%) red, pink granulation within the wound bed. There is no necrotic tissue within the wound bed. Assessment Active Problems ICD-10 Lymphedema, not elsewhere classified Chronic venous hypertension (idiopathic) with ulcer and inflammation of right lower extremity Non-pressure chronic ulcer of other part of right lower leg with fat layer exposed Type 2 diabetes mellitus with other skin ulcer Other specified peripheral vascular diseases Essential (primary) hypertension Long term (current) use of anticoagulants Procedures Wound #3 Pre-procedure diagnosis of  Wound #3 is a Lymphedema located on the Right,Posterior Lower Leg . There was a  Unna Boot Compression Therapy Procedure by Deon Pilling, RN. Post procedure Diagnosis Wound #3: Same as Pre-Procedure Plan Follow-up Appointments: Return Appointment in 1 week. Jeri Cos, PA and [place], Room 8 Wednesday Other: - Follow up with Vein and Vascular on 06/25/2022 Anesthetic: (In clinic) Topical Lidocaine 5% applied to wound bed Bathing/ Shower/ Hygiene: May shower with protection but do not get wound dressing(s) wet. Edema Control - Lymphedema / SCD / Other: Elevate legs to the level of the heart or above for 30 minutes daily and/or when sitting, a frequency of: - 3-4 times a day throughout the day ensure to elevate legs. Avoid standing for long periods of time. Exercise regularly Moisturize legs daily. - need lotion to left leg at night. Apply Tubigrip size E in clinic and may use under the juxtalite HD if unable to get socks on. Compression stocking or Garment 20-30 mm/Hg pressure to: - Patient to work on wearing the juxtalite HD to left leg. Apply daily. WOUND #3: - Lower Leg Wound Laterality: Right, Posterior Cleanser: Soap and Water 1 x Per Week/30 Days Discharge Instructions: May shower and wash wound with dial antibacterial soap and water prior to dressing change. Peri-Wound Care: Triamcinolone 15 (g) 1 x Per Week/30 Days Discharge Instructions: Use triamcinolone 15 (g) as directed Peri-Wound Care: Sween Lotion (Moisturizing lotion) 1 x Per Week/30 Days Discharge Instructions: Apply moisturizing lotion as directed Prim Dressing: KerraCel Ag Gelling Fiber Dressing, 4x5 in (silver alginate) 1 x Per Week/30 Days ary Discharge Instructions: Apply silver alginate to wound bed as instructed Secondary Dressing: ABD Pad, 8x10 1 x Per Week/30 Days Discharge Instructions: Apply over primary dressing as directed. Com pression Wrap: Unnaboot w/Calamine, 4x10 (in/yd) 1 x Per Week/30 Days Discharge  Instructions: Apply Unnaboot as directed. 1. I am good recommend currently based on what I am seeing that we have the patient go ahead and continue to monitor for any signs of worsening or infection. Obviously I think she is doing quite well and hopefully will be very close to complete resolution shortly but at the same time this is still currently something that we need to keep a close eye on. 2. I a I am going to go ahead and recommend that we continue with the silver alginate dressing to cover followed by the Unna boot wraps which are doing a great job. 3. I would also suggest the patient should continue to elevate her legs much as possible to help with edema control. We will see patient back for reevaluation in 1 week here in the clinic. If anything worsens or changes patient will contact our office for additional recommendations. Electronic Signature(s) Signed: 06/16/2022 5:38:43 PM By: Worthy Keeler PA-C Entered By: Worthy Keeler on 06/16/2022 17:38:43 -------------------------------------------------------------------------------- SuperBill Details Patient Name: Date of Service: Brandi Morgans A. 06/16/2022 Medical Record Number: [id number] Patient Account Number: [id number] Date of Birth/Sex: Treating RN: 10/20/1950 (71 y.o. [name], Tammi Klippel Primary Care Provider: [name] Other Clinician: Referring Provider: Treating Provider/Extender: [name], [name] in Treatment: 1 Diagnosis Coding ICD-10 Codes Code Description I89.0 Lymphedema, not elsewhere classified I87.331 Chronic venous hypertension (idiopathic) with ulcer and inflammation of right lower extremity L97.812 Non-pressure chronic ulcer of other part of right lower leg with fat layer exposed E11.622 Type 2 diabetes mellitus with other skin ulcer I73.89 Other specified peripheral vascular diseases I10 Essential (primary) hypertension Z79.01 Long term (current) use of  anticoagulants Facility Procedures CPT4 Code: [id number] Description: (Facility Use Only) [phone] -  APPLY UNNA BOOT RT Modifier: Quantity: 1 Physician Procedures : CPT4 Code Description Modifier [id number] - WC PHYS LEVEL 3 - EST PT ICD-10 Diagnosis Description I89.0 Lymphedema, not elsewhere classified I87.331 Chronic venous hypertension (idiopathic) with ulcer and inflammation of right lower extremity  L97.812 Non-pressure chronic ulcer of other part of right lower leg with fat layer exposed E11.622 Type 2 diabetes mellitus with other skin ulcer Quantity: 1 Electronic Signature(s) Signed: 06/16/2022 5:38:58 PM By: Worthy Keeler PA-C Previous Signature: 06/16/2022 5:32:30 PM Version By: Deon Pilling RN, BSN Entered By: Worthy Keeler on 06/16/2022 17:38:57

## 2022-06-17 ENCOUNTER — Other Ambulatory Visit: Payer: Self-pay | Admitting: *Deleted

## 2022-06-17 DIAGNOSIS — I739 Peripheral vascular disease, unspecified: Secondary | ICD-10-CM

## 2022-06-22 NOTE — Progress Notes (Unsigned)
Office Note    HPI: [name] is a 71 y.o. (Mar 05, 1951) female with history of CAD, PAD, chronic diastolic CHF, paroxysmal atrial fibrillation, type 2 diabetes on insulin, and morbid obesity presenting at the request of .[name], MD for bilateral lower extremity ulcerations on her calves.  Patient was last seen 6 months ago and found to have mixed arterial venous disease. A [place] native, and graduate of [employer], [name] worked in Scientist, research (medical) for years.  She is now retired.  Living at home with her son.  She is in a wheelchair due to morbid obesity, but is able to turn and pivot.  She no longer ambulates beyond that.   [name] as wounds on bilateral calves, on today's visit these were compressed with Unna boots.  She stated the superficial ulcerations occurred after scratching areas that itch on her calf.  Skin was thin and there is significant edema due to her chronic diastolic heart failure.  Per [name], the wounds are healing. Denies wounds on the toes denies rest pain.    The pt is  on a statin for cholesterol management.  The pt is not on a daily aspirin.   Other AC:  warfarin The pt is  on medication for hypertension.   The pt is  diabetic.  Tobacco hx:  former  Past Medical History:  Diagnosis Date   Arthritis    "knees" (02/05/2016)   Cancer ([place]) 5/10   LUL Vats    CHF (congestive heart failure) (HCC)    Coronary artery disease    GERD (gastroesophageal reflux disease)    tx meds   H/O hiatal hernia    History of blood transfusion "several"   last april 2015 s/p knee surgery (02/05/2016)   Hypercholesteremia    Hypertension    Lung cancer ([place])    2010, surgery 18% left lung no radiation or chemo   Non-STEMI (non-ST elevated myocardial infarction) ([place]) 04/07/2012   See cath results below   Paroxysmal atrial fibrillation ([place]) 04/07/2012   On Warfarin   Peripheral vascular disease ([place]) 8/12   Lt SFA PTA   Presence of stent in LAD coronary artery  04/07/2012   Xience Expedition DES 2.75 mm x 18 mm (dilated to 3.0 mm)   Sleep apnea    does not wear CPAP   Type II diabetes mellitus (HCC)    insulin dependent    Past Surgical History:  Procedure Laterality Date   ABDOMINAL HYSTERECTOMY  1990   BREAST EXCISIONAL BIOPSY Right    CARDIAC CATHETERIZATION  11/04/2008   patent RCA, LM, and Circ, nl EF   CARDIAC CATHETERIZATION  02/20/2002   patent coronaries with the only abnormality being a smooth luminla irregularity in the mid intermediate ramus branch no felt to be hemodynamically significant, nl LV   CARDIAC CATHETERIZATION N/A 02/05/2016   Procedure: Left Heart Cath and Coronary Angiography;  Surgeon: Leonie Man, MD;  Location: [facility];  Service: Cardiovascular;  Laterality: N/A;   CARDIAC CATHETERIZATION N/A 02/05/2016   Procedure: Coronary Stent Intervention;  Surgeon: Leonie Man, MD;  Location: [facility];  Service: Cardiovascular;  Laterality: N/A;   CARDIOVERSION N/A 07/16/2021   Procedure: CARDIOVERSION;  Surgeon: Werner Lean, MD;  Location: [facility];  Service: Cardiovascular;  Laterality: N/A;   CATARACT EXTRACTION W/ INTRAOCULAR LENS  IMPLANT, BILATERAL  2013   CORONARY ANGIOPLASTY WITH STENT PLACEMENT  04/07/2012   Xience Expedition DES 2.84m x 18 mm (  dilated to 3.0 mm) to the prox LAD    CORONARY ANGIOPLASTY WITH STENT PLACEMENT  2013; 2015   CORONARY ARTERY BYPASS GRAFT N/A 08/17/2017   Procedure: CORONARY ARTERY BYPASS GRAFTING (CABG) TIMES 1 USING LEFT INTERNAL MAMMARY ARTERY;  Surgeon: Melrose Nakayama, MD;  Location: [facility];  Service: Open Heart Surgery;  Laterality: N/A;   DILATION AND CURETTAGE OF UTERUS  <1990   ESOPHAGOGASTRODUODENOSCOPY N/A 05/23/2015   Procedure: ESOPHAGOGASTRODUODENOSCOPY (EGD);  Surgeon: Carol Ada, MD;  Location: [facility];  Service: Endoscopy;  Laterality: N/A;   ESOPHAGOGASTRODUODENOSCOPY (EGD) WITH PROPOFOL N/A 06/13/2015   Procedure:  ESOPHAGOGASTRODUODENOSCOPY (EGD) WITH PROPOFOL;  Surgeon: Carol Ada, MD;  Location: [facility];  Service: Endoscopy;  Laterality: N/A;   ESOPHAGOGASTRODUODENOSCOPY (EGD) WITH PROPOFOL N/A 04/07/2018   Procedure: ESOPHAGOGASTRODUODENOSCOPY (EGD) WITH PROPOFOL;  Surgeon: Carol Ada, MD;  Location: [facility];  Service: Endoscopy;  Laterality: N/A;  fluoroscopy is needed   FRACTURE SURGERY     HAMMER TOE SURGERY Bilateral 1993   with screws, on screw removed   JOINT REPLACEMENT     KNEE ARTHROSCOPY Bilateral    left x2, right x1   LAPAROSCOPIC CHOLECYSTECTOMY     LEFT HEART CATH AND CORONARY ANGIOGRAPHY N/A 08/11/2017   Procedure: LEFT HEART CATH AND CORONARY ANGIOGRAPHY;  Surgeon: Martinique, Peter M, MD;  Location: [facility];  Service: Cardiovascular;  Laterality: N/A;   LEFT HEART CATHETERIZATION WITH CORONARY ANGIOGRAM N/A 04/07/2012   Procedure: LEFT HEART CATHETERIZATION WITH CORONARY ANGIOGRAM;  Surgeon: [name], MD;  Location: [facility];  Service: Cardiovascular;  Laterality: N/A;   LEFT HEART CATHETERIZATION WITH CORONARY ANGIOGRAM N/A 05/27/2014   Procedure: LEFT HEART CATHETERIZATION WITH CORONARY ANGIOGRAM;  Surgeon: [name], MD; LAD 99% ISR, CFX 50-60%, RCA (dominant) no sig dz, EF 60%   LOWER EXTREMITY ANGIOGRAM  05/17/11   directional atherectomy to the prox L SFA using a LX Man TurboHawk, ballooned with a [place] balloon    ORIF ANKLE FRACTURE  07/09/2012   Procedure: OPEN REDUCTION INTERNAL FIXATION (ORIF) ANKLE FRACTURE;  Surgeon: Meredith Pel, MD;  Location: [facility];  Service: Orthopedics;  Laterality: Left;  open reduction internal fixation trimalleolar ankle fracture medial malleolous fixation   PERCUTANEOUS CORONARY STENT INTERVENTION (PCI-S) N/A 04/07/2012   Procedure: PERCUTANEOUS CORONARY STENT INTERVENTION (PCI-S);  Surgeon: [name], MD;  Location[facility];  Service: Cardiovascular;  Laterality: N/A;   PERCUTANEOUS  CORONARY STENT INTERVENTION (PCI-S)  05/27/2014   Procedure: PERCUTANEOUS CORONARY STENT INTERVENTION (PCI-S);  Surgeon: [name], MD; 3 mm x 12 mm long Xience Xpedition DES to the proximal LAD   SAVORY DILATION N/A 06/13/2015   Procedure: SAVORY DILATION;  Surgeon: Carol Ada, MD;  [facility];  Service: Endoscopy;  Laterality: N/A;   SAVORY DILATION N/A 04/07/2018   Procedure: SAVORY DILATION;  Surgeon: Carol Ada, MD;  [facility];  Service: Endoscopy;  Laterality: N/A;   TEE WITHOUT CARDIOVERSION N/A 08/17/2017   Procedure: TRANSESOPHAGEAL ECHOCARDIOGRAM (TEE);  Surgeon: Melrose Nakayama, MD;  Location: [facility];  Service: Open Heart Surgery;  Laterality: N/A;   TEE WITHOUT CARDIOVERSION N/A 07/16/2021   Procedure: TRANSESOPHAGEAL ECHOCARDIOGRAM (TEE);  Surgeon: Werner Lean, MD;  Location: [facility];  Service: Cardiovascular;  Laterality: N/A;   TONSILLECTOMY     TOTAL KNEE ARTHROPLASTY Left 2003   TOTAL KNEE REVISION Left 11/05/2013   Procedure: LEFT TOTAL KNEE RESECTION;  Surgeon: Mauri Pole, MD;  Location: [facility];  Service: Orthopedics;  Laterality: Left;   TOTAL KNEE REVISION Left 2007   opened in 2006 and cleaned out, 2007 revision   TOTAL KNEE REVISION Left 12/31/2013   Procedure: RE-INPLANTATION LEFT TOTAL KNEE ;  Surgeon: Mauri Pole, MD;  Location: [facility];  Service: Orthopedics;  Laterality: Left;   VIDEO ASSISTED THORACOSCOPY (VATS)/ LOBECTOMY  01/2009    Social History   Socioeconomic History   Marital status: Single    Spouse name: Not on file   Number of children: Not on file   Years of education: Not on file   Highest education level: Not on file  Occupational History   Occupation: Retired  Tobacco Use   Smoking status: Former    Packs/day: 1.00    Years: 20.00    Total pack years: 20.00    Types: Cigarettes    Quit date: 09/27/1984    Years since quitting: 37.7   Smokeless tobacco: Never  Vaping Use   Vaping  Use: Never used  Substance and Sexual Activity   Alcohol use: No   Drug use: No   Sexual activity: Not on file  Other Topics Concern   Not on file  Social History Narrative   Patient lives alone.   Social Determinants of Health   Financial Resource Strain: Not on file  Food Insecurity: No Food Insecurity (05/07/2020)   Hunger Vital Sign    Worried About Running Out of Food in the Last Year: Never true    Ran Out of Food in the Last Year: Never true  Transportation Needs: No Transportation Needs (05/07/2020)   PRAPARE - Hydrologist (Medical): No    Lack of Transportation (Non-Medical): No  Physical Activity: Not on file  Stress: Stress Concern Present (11/14/2019)   [place]    Feeling of Stress : Very much  Social Connections: Not on file  Intimate Partner Violence: Not on file    Family History  Problem Relation Age of Onset   Hypertension Mother    Coronary artery disease Brother    Hypertension Sister     Current Outpatient Medications  Medication Sig Dispense Refill   Blood Glucose Monitoring Suppl ([place]) w/Device KIT      Continuous Blood Gluc Receiver (FREESTYLE LIBRE 2 READER) DEVI See admin instructions. (Patient not taking: Reported on 12/04/2021)     furosemide (LASIX) 40 MG tablet Take 1 tablet (40 mg total) by mouth 3 (three) times a week. TAKE Monday Wednesday AND FRIDAY (Patient not taking: Reported on 12/04/2021) 36 tablet 3   insulin NPH-regular Human (70-30) 100 UNIT/ML injection Inject 20-62 Units into the skin 2 (two) times daily with a meal. 62 units in the morning and 20 units at bedtme     Lancets (ONETOUCH DELICA PLUS [id number]) MISC Apply topically.     lisinopril-hydrochlorothiazide (PRINZIDE,ZESTORETIC) 20-25 MG per tablet Take 1 tablet by mouth daily.     metoCLOPramide (REGLAN) 10 MG tablet Take 10 mg by mouth 2 (two) times daily.  Per patient     metoprolol succinate (TOPROL-XL) 50 MG 24 hr tablet TAKE 1 TABLET BY MOUTH EVERY DAY 30 tablet 0   metoprolol tartrate (LOPRESSOR) 25 MG tablet Take 1 table as needed for heart rate above 120bpm. (Patient not taking: Reported on 12/04/2021) 30 tablet 6   ONETOUCH VERIO test strip 1 each 2 (two) times daily.  pantoprazole (PROTONIX) 40 MG tablet Take 1 tablet (40 mg total) by mouth daily. 90 tablet 3   simvastatin (ZOCOR) 40 MG tablet TAKE 1 TABLET BY MOUTH DAILY AT 6 PM. 30 tablet 3   spironolactone (ALDACTONE) 25 MG tablet Take 1 tablet (25 mg total) by mouth 3 (three) times a week. TAKE Monday Wednesday AND Friday (Patient not taking: Reported on 12/04/2021) 36 tablet 3   Vitamin D, Cholecalciferol, 1000 units TABS Take 1,000 Units by mouth every morning.      warfarin (COUMADIN) 7.5 MG tablet TAKE 1/2 TO 1 TABLET DAILY AS DIRECTED BY COUMADIN CLINIC 90 tablet 1   No current facility-administered medications for this visit.    Allergies  Allergen Reactions   Levofloxacin Itching   Morphine And Related Itching    Pt reports oxycodone history with no allergy.     REVIEW OF SYSTEMS:   _0  denotes positive finding, _1  denotes negative finding Cardiac  Comments:  Chest pain or chest pressure:    Shortness of breath upon exertion:    Short of breath when lying flat:    Irregular heart rhythm:        Vascular    Pain in calf, thigh, or hip brought on by ambulation:    Pain in feet at night that wakes you up from your sleep:     Blood clot in your veins:    Leg swelling:         Pulmonary    Oxygen at home:    Productive cough:     Wheezing:         Neurologic    Sudden weakness in arms or legs:     Sudden numbness in arms or legs:     Sudden onset of difficulty speaking or slurred speech:    Temporary loss of vision in one eye:     Problems with dizziness:         Gastrointestinal    Blood in stool:     Vomited blood:         Genitourinary    Burning  when urinating:     Blood in urine:        Psychiatric    Major depression:         Hematologic    Bleeding problems:    Problems with blood clotting too easily:        Skin    Rashes or ulcers:        Constitutional    Fever or chills:      PHYSICAL EXAMINATION:  There were no vitals filed for this visit.  General:  WDWN in NAD; vital signs documented above Gait: Not observed HENT: WNL, normocephalic Pulmonary: normal non-labored breathing , without wheezing Cardiac: regular HR, Abdomen: soft, NT, no masses Skin: without rashes, lipodermatosclerosis appreciated on bilateral lower extremities Vascular Exam/Pulses:  Right Left  Radial 2+ (normal) 2+ (normal)  Ulnar 2+ (normal) 2+ (normal)  Femoral    Popliteal    DP absent absent  PT absent absent   Extremities: without ischemic changes, without Gangrene , without cellulitis; with open wounds small, punctate, 3 mm, superficial Musculoskeletal: no muscle wasting or atrophy  Neurologic: A&O X 3;  No focal weakness or paresthesias are detected Psychiatric:  The pt has Normal affect.   Non-Invasive Vascular Imaging:   ABI Findings:  +---------+------------------+-----+----------+--------+  Right    Rt Pressure (mmHg)IndexWaveform  Comment   +---------+------------------+-----+----------+--------+  Brachial 174                                        +---------+------------------+-----+----------+--------+  PTA      255               1.39 monophasic          +---------+------------------+-----+----------+--------+  DP       255               1.39 monophasic          +---------+------------------+-----+----------+--------+  Great Toe80                0.44                     +---------+------------------+-----+----------+--------+   +---------+------------------+-----+----------+-------+  Left     Lt Pressure (mmHg)IndexWaveform  Comment   +---------+------------------+-----+----------+-------+  Brachial 183                                       +---------+------------------+-----+----------+-------+  PTA      255               1.39 monophasic         +---------+------------------+-----+----------+-------+  DP       255               1.39 monophasic         +---------+------------------+-----+----------+-------+  Great Toe80                0.44                    +---------+------------------+-----+----------+-------+      ASSESSMENT/PLAN: LANDRY LOOKINGBILL is a nonambulatory 71 y.o. female presenting with bilateral lower extremity heaviness and discoloration in the calves consistent with bilateral lower extremity chronic venous insufficiency.  Wounds from her initial visit with me several months ago healed.  Current wounds are from scratching her calf due to itching.  Legs are edematous with thin skin at baseline due to her chronic diastolic heart failure.  ABIs were reviewed, demonstrating a decrease in toe pressure bilaterally.  Fortunately, she does not have any toe wounds or rest pain.    With mixed arterial venous disease, and lipedema from morbid obesity, [name] would be best served with elevation and compression.  She is currently being treated with Unna boots.    My plan is to see her in short order follow-up.  I asked that she call my office should wound healing stagnate, or wounds worsen.  Due to her comorbidities, and her nonambulatory status, she is not an open surgical candidate.  We will discuss possible endovascular treatment should wounds worsen.   Broadus John, MD [facility] [phone]

## 2022-06-23 ENCOUNTER — Encounter (HOSPITAL_BASED_OUTPATIENT_CLINIC_OR_DEPARTMENT_OTHER): Payer: HMO | Admitting: Physician Assistant

## 2022-06-23 DIAGNOSIS — L97218 Non-pressure chronic ulcer of right calf with other specified severity: Secondary | ICD-10-CM | POA: Diagnosis not present

## 2022-06-23 DIAGNOSIS — E11622 Type 2 diabetes mellitus with other skin ulcer: Secondary | ICD-10-CM | POA: Diagnosis not present

## 2022-06-23 DIAGNOSIS — L97222 Non-pressure chronic ulcer of left calf with fat layer exposed: Secondary | ICD-10-CM | POA: Diagnosis not present

## 2022-06-23 NOTE — Progress Notes (Addendum)
[name], [name] ([id number]) Visit Report for 06/23/2022 Chief Complaint Document Details Patient Name: Date of Service: [name], [name] 06/23/2022 9:00 A M Medical Record Number: [id number] Patient Account Number: [id number] Date of Birth/Sex: Treating RN: 1950/12/27 (71 y.o. F) Primary Care Provider: [name] Other Clinician: Referring Provider: Treating Provider/Extender: [name], [name] in Treatment: 2 Information Obtained from: Patient Chief Complaint Right LE Ulcers Electronic Signature(s) Signed: 06/23/2022 9:29:01 AM By: Worthy Keeler PA-C Entered By: Worthy Keeler on 06/23/2022 09:29:01 -------------------------------------------------------------------------------- HPI Details Patient Name: Date of Service: Brandi Morgans A. 06/23/2022 9:00 [place] Record Number: [id number] Patient Account Number: [id number] Date of Birth/Sex: Treating RN: 02-27-1951 (71 y.o. F) Primary Care Provider: [name] Other Clinician: Referring Provider: Treating Provider/Extender: [name], [name] in Treatment: 2 History of Present Illness HPI Description: 08/05/2021 upon evaluation today patient has bilateral wounds on her lower extremities. She has been tolerating the dressing changes without complication at home but unfortunately is not really seeing signs of improvement like she would like to see. She does have what appears to be bilateral lymphedema due to chronic venous insufficiency most likely. She is on fluid pills and takes them regularly. She really does not sit with her legs elevated much she has a recliner but again does not use that often. I think she should be using this more to get her feet up. Subsequently she tells me this has been going on for several weeks to even months. She finally decided to make a referral herself to come in to be seen. Patient does have a history of diabetes mellitus  type 2, some evidence of potential peripheral vascular disease as she is noncompressible with her ABIs in the office today, hypertension, and she is on long-term anticoagulant therapy. 08/12/2021 upon evaluation today patient's wounds appear to be doing well in regard to her legs currently. Actually very pleased with where things stand and I do not see any signs of active infection which is great news. No fevers, chills, nausea, vomiting, or diarrhea. 08/19/2021 upon evaluation today patient appears to be doing well with regard to her legs. Everything actually appears to be healing quite nicely with doing extremely well. Fortunately there does not appear to be any signs of active infection which is great news. Readmission: 06-09-2022 upon evaluation today patient appears for reevaluation in the clinic concerning issues that she has been having with her leg. This is actually her right leg at this time though when I saw her previously was both legs. Fortunately there does not appear to be any evidence of active infection at this time which is great news. No fevers, chills, nausea, vomiting, or diarrhea. Her past medical history really has not changed significantly since last time I saw her. I did discuss with her today how she has been doing with wearing her compression socks she tells me she really has not been wearing those because "she cannot get them on her son is not able to help. She tells me that he works at night and by the time he comes and goes to bed she is not up yet and then she wakes up later in the past do not cross. I talked her today about actually having a time later in the day when he could help with getting the socks on and that is a possibility. With that being said she currently states that she had not thought through that completely I  am not sure if that something she has been worked out or not. 06-16-2022 upon evaluation today patient appears to be doing well currently in regard to  her wounds all things considered. I am seeing excellent signs of improvement I think were getting very close to complete resolution which is great news and overall I do not see anything that seems to be worsening significantly also great news. In general I think we are on the right track here. She does have a follow-up with vein and vascular next Friday. 06-23-2022 upon evaluation today the wound on patient's right leg actually appears to be healed unfortunately she has an area on the left leg which is still open. I am thinking that this may be brand-new skin on the right leg so I think we probably should still continue to wrap her for at least 1 more week and I discussed that with her today. Otherwise I think that we are on the right track for the most part. Electronic Signature(s) Signed: 06/23/2022 10:08:41 AM By: Worthy Keeler PA-C Entered By: Worthy Keeler on 06/23/2022 10:08:40 -------------------------------------------------------------------------------- Physical Exam Details Patient Name: Date of Service: [name], [name] 06/23/2022 9:00 A M Medical Record Number: [id number] Patient Account Number: [id number] Date of Birth/Sex: Treating RN: 13-Feb-1951 (71 y.o. F) Primary Care Provider: [name] Other Clinician: Referring Provider: Treating Provider/Extender: [name], [name] in Treatment: 2 Constitutional Obese and well-hydrated in no acute distress. Respiratory normal breathing without difficulty. Psychiatric this patient is able to make decisions and demonstrates good insight into disease process. Alert and Oriented x 3. pleasant and cooperative. Notes Upon inspection patient's wound bed actually showed signs of good granulation and epithelization at this point. Fortunately I do not see any signs of active infection locally or systemically which is great news and overall I am extremely pleased with where we stand today. I do think that  the right leg is almost completely closed and very thin skin covering in the left leg is a very superficial wound. Electronic Signature(s) Signed: 06/23/2022 10:09:05 AM By: Worthy Keeler PA-C Entered By: Worthy Keeler on 06/23/2022 10:09:05 -------------------------------------------------------------------------------- Physician Orders Details Patient Name: Date of Service: Brandi Morgans A. 06/23/2022 9:00 [place] Record Number: [id number] Patient Account Number: [id number] Date of Birth/Sex: Treating RN: 02-Mar-1951 (71 y.o. [name], Tammi Klippel Primary Care Provider: [name] Other Clinician: Referring Provider: Treating Provider/Extender: Angelica Pou in Treatment: 2 Verbal / Phone Orders: No Diagnosis Coding ICD-10 Coding Code Description I89.0 Lymphedema, not elsewhere classified I87.331 Chronic venous hypertension (idiopathic) with ulcer and inflammation of right lower extremity L97.812 Non-pressure chronic ulcer of other part of right lower leg with fat layer exposed E11.622 Type 2 diabetes mellitus with other skin ulcer I73.89 Other specified peripheral vascular diseases I10 Essential (primary) hypertension Z79.01 Long term (current) use of anticoagulants Follow-up Appointments ppointment in 1 week. Jeri Cos, PA and [place], Room 8 Wednesday Return A Other: - Follow up with Vein and Vascular on 06/25/2022 ****please take lasix as directed by PCP.**** Bring in compression garments juxtalite HDs next week. Anesthetic (In clinic) Topical Lidocaine 5% applied to wound bed Bathing/ Shower/ Hygiene May shower with protection but do not get wound dressing(s) wet. Edema Control - Lymphedema / SCD / Other Elevate legs to the level of the heart or above for 30 minutes daily and/or when sitting, a frequency of: - 3-4 times a day throughout the day ensure  to elevate legs. Avoid standing for long periods of time. Exercise  regularly Wound Treatment Wound #3 - Lower Leg Wound Laterality: Right, Posterior Cleanser: Soap and Water 1 x Per Week/30 Days Discharge Instructions: May shower and wash wound with dial antibacterial soap and water prior to dressing change. Peri-Wound Care: Triamcinolone 15 (g) 1 x Per Week/30 Days Discharge Instructions: Use triamcinolone 15 (g) as directed Peri-Wound Care: Sween Lotion (Moisturizing lotion) 1 x Per Week/30 Days Discharge Instructions: Apply moisturizing lotion as directed Prim Dressing: KerraCel Ag Gelling Fiber Dressing, 4x5 in (silver alginate) 1 x Per Week/30 Days ary Discharge Instructions: Apply silver alginate to wound bed as instructed Secondary Dressing: Woven Gauze Sponge, Non-Sterile 4x4 in 1 x Per Week/30 Days Discharge Instructions: Apply over primary dressing as directed. Compression Wrap: Unnaboot w/Calamine, 4x10 (in/yd) 1 x Per Week/30 Days Discharge Instructions: Apply Unnaboot as directed. Wound #4 - Lower Leg Wound Laterality: Left, Posterior Cleanser: Soap and Water 1 x Per Week/30 Days Discharge Instructions: May shower and wash wound with dial antibacterial soap and water prior to dressing change. Peri-Wound Care: Triamcinolone 15 (g) 1 x Per Week/30 Days Discharge Instructions: Use triamcinolone 15 (g) as directed Peri-Wound Care: Sween Lotion (Moisturizing lotion) 1 x Per Week/30 Days Discharge Instructions: Apply moisturizing lotion as directed Prim Dressing: KerraCel Ag Gelling Fiber Dressing, 4x5 in (silver alginate) 1 x Per Week/30 Days ary Discharge Instructions: Apply silver alginate to wound bed as instructed Secondary Dressing: Woven Gauze Sponge, Non-Sterile 4x4 in 1 x Per Week/30 Days Discharge Instructions: Apply over primary dressing as directed. Compression Wrap: Unnaboot w/Calamine, 4x10 (in/yd) 1 x Per Week/30 Days Discharge Instructions: Apply Unnaboot as directed. Electronic Signature(s) Signed: 06/23/2022 1:33:45 PM By:  Deon Pilling RN, BSN Signed: 06/23/2022 5:27:13 PM By: Worthy Keeler PA-C Entered By: Deon Pilling on 06/23/2022 09:53:34 -------------------------------------------------------------------------------- Problem List Details Patient Name: Date of Service: Brandi Morgans A. 06/23/2022 9:00 [place] Record Number: [id number] Patient Account Number: [id number] Date of Birth/Sex: Treating RN: August 10, 1951 (71 y.o. [name] Primary Care Provider: Other Clinician: [name] Referring Provider: Treating Provider/Extender: Angelica Pou in Treatment: 2 Active Problems ICD-10 Encounter Code Description Active Date MDM Diagnosis I89.0 Lymphedema, not elsewhere classified 06/09/2022 No Yes I87.331 Chronic venous hypertension (idiopathic) with ulcer and inflammation of right 06/09/2022 No Yes lower extremity L97.812 Non-pressure chronic ulcer of other part of right lower leg with fat layer 06/09/2022 No Yes exposed E11.622 Type 2 diabetes mellitus with other skin ulcer 06/09/2022 No Yes I73.89 Other specified peripheral vascular diseases 06/09/2022 No Yes I10 Essential (primary) hypertension 06/09/2022 No Yes Z79.01 Long term (current) use of anticoagulants 06/09/2022 No Yes Inactive Problems Resolved Problems Electronic Signature(s) Signed: 06/23/2022 9:28:53 AM By: Worthy Keeler PA-C Entered By: Worthy Keeler on 06/23/2022 09:28:52 -------------------------------------------------------------------------------- Progress Note Details Patient Name: Date of Service: Brandi Morgans A. 06/23/2022 9:00 [place] Record Number: [id number] Patient Account Number: [id number] Date of Birth/Sex: Treating RN: [birth year] (71 y.o. F) Primary Care Provider: [name] Other Clinician: Referring Provider: Treating Provider/Extender: Angelica Pou in Treatment: 2 Subjective Chief Complaint Information  obtained from Patient Right LE Ulcers History of Present Illness (HPI) 08/05/2021 upon evaluation today patient has bilateral wounds on her lower extremities. She has been tolerating the dressing changes without complication at home but unfortunately is not really seeing signs of improvement like she would like to see. She does have what appears to  be bilateral lymphedema due to chronic venous insufficiency most likely. She is on fluid pills and takes them regularly. She really does not sit with her legs elevated much she has a recliner but again does not use that often. I think she should be using this more to get her feet up. Subsequently she tells me this has been going on for several weeks to even months. She finally decided to make a referral herself to come in to be seen. Patient does have a history of diabetes mellitus type 2, some evidence of potential peripheral vascular disease as she is noncompressible with her ABIs in the office today, hypertension, and she is on long-term anticoagulant therapy. 08/12/2021 upon evaluation today patient's wounds appear to be doing well in regard to her legs currently. Actually very pleased with where things stand and I do not see any signs of active infection which is great news. No fevers, chills, nausea, vomiting, or diarrhea. 08/19/2021 upon evaluation today patient appears to be doing well with regard to her legs. Everything actually appears to be healing quite nicely with doing extremely well. Fortunately there does not appear to be any signs of active infection which is great news. Readmission: 06-09-2022 upon evaluation today patient appears for reevaluation in the clinic concerning issues that she has been having with her leg. This is actually her right leg at this time though when I saw her previously was both legs. Fortunately there does not appear to be any evidence of active infection at this time which is great news. No fevers, chills, nausea,  vomiting, or diarrhea. Her past medical history really has not changed significantly since last time I saw her. I did discuss with her today how she has been doing with wearing her compression socks she tells me she really has not been wearing those because "she cannot get them on her son is not able to help. She tells me that he works at night and by the time he comes and goes to bed she is not up yet and then she wakes up later in the past do not cross. I talked her today about actually having a time later in the day when he could help with getting the socks on and that is a possibility. With that being said she currently states that she had not thought through that completely I am not sure if that something she has been worked out or not. 06-16-2022 upon evaluation today patient appears to be doing well currently in regard to her wounds all things considered. I am seeing excellent signs of improvement I think were getting very close to complete resolution which is great news and overall I do not see anything that seems to be worsening significantly also great news. In general I think we are on the right track here. She does have a follow-up with vein and vascular next Friday. 06-23-2022 upon evaluation today the wound on patient's right leg actually appears to be healed unfortunately she has an area on the left leg which is still open. I am thinking that this may be brand-new skin on the right leg so I think we probably should still continue to wrap her for at least 1 more week and I discussed that with her today. Otherwise I think that we are on the right track for the most part. Objective Constitutional Obese and well-hydrated in no acute distress. Vitals Time Taken: 9:15 AM, Height: 63 in, Weight: 284 lbs, BMI: 50.3, Temperature: 98.7 F, Pulse:  93 bpm, Respiratory Rate: 20 breaths/min, Blood Pressure: 143/71 mmHg, Capillary Blood Glucose: 156 mg/dl. Respiratory normal breathing without  difficulty. Psychiatric this patient is able to make decisions and demonstrates good insight into disease process. Alert and Oriented x 3. pleasant and cooperative. General Notes: Upon inspection patient's wound bed actually showed signs of good granulation and epithelization at this point. Fortunately I do not see any signs of active infection locally or systemically which is great news and overall I am extremely pleased with where we stand today. I do think that the right leg is almost completely closed and very thin skin covering in the left leg is a very superficial wound. Integumentary (Hair, Skin) Wound #3 status is Open. Original cause of wound was Blister. The date acquired was: 05/26/2022. The wound has been in treatment 2 weeks. The wound is located on the Right,Posterior Lower Leg. The wound measures 0.1cm length x 0.1cm width x 0.1cm depth; 0.008cm^2 area and 0.001cm^3 volume. There is no tunneling or undermining noted. There is a none present amount of drainage noted. The wound margin is distinct with the outline attached to the wound base. There is no granulation within the wound bed. There is no necrotic tissue within the wound bed. Wound #4 status is Open. Original cause of wound was Gradually Appeared. The date acquired was: 06/16/2022. The wound is located on the Left,Posterior Lower Leg. The wound measures 0.7cm length x 0.6cm width x 0.1cm depth; 0.33cm^2 area and 0.033cm^3 volume. There is Fat Layer (Subcutaneous Tissue) exposed. There is no tunneling or undermining noted. There is a medium amount of serosanguineous drainage noted. The wound margin is distinct with the outline attached to the wound base. There is medium (34-66%) red granulation within the wound bed. There is a medium (34-66%) amount of necrotic tissue within the wound bed including Adherent Slough. Assessment Active Problems ICD-10 Lymphedema, not elsewhere classified Chronic venous hypertension (idiopathic)  with ulcer and inflammation of right lower extremity Non-pressure chronic ulcer of other part of right lower leg with fat layer exposed Type 2 diabetes mellitus with other skin ulcer Other specified peripheral vascular diseases Essential (primary) hypertension Long term (current) use of anticoagulants Plan Follow-up Appointments: Return Appointment in 1 week. Jeri Cos, PA and [place], Room 8 Wednesday Other: - Follow up with Vein and Vascular on 06/25/2022 ****please take lasix as directed by PCP.**** Bring in compression garments juxtalite HDs next week. Anesthetic: (In clinic) Topical Lidocaine 5% applied to wound bed Bathing/ Shower/ Hygiene: May shower with protection but do not get wound dressing(s) wet. Edema Control - Lymphedema / SCD / Other: Elevate legs to the level of the heart or above for 30 minutes daily and/or when sitting, a frequency of: - 3-4 times a day throughout the day ensure to elevate legs. Avoid standing for long periods of time. Exercise regularly WOUND #3: - Lower Leg Wound Laterality: Right, Posterior Cleanser: Soap and Water 1 x Per Week/30 Days Discharge Instructions: May shower and wash wound with dial antibacterial soap and water prior to dressing change. Peri-Wound Care: Triamcinolone 15 (g) 1 x Per Week/30 Days Discharge Instructions: Use triamcinolone 15 (g) as directed Peri-Wound Care: Sween Lotion (Moisturizing lotion) 1 x Per Week/30 Days Discharge Instructions: Apply moisturizing lotion as directed Prim Dressing: KerraCel Ag Gelling Fiber Dressing, 4x5 in (silver alginate) 1 x Per Week/30 Days ary Discharge Instructions: Apply silver alginate to wound bed as instructed Secondary Dressing: Woven Gauze Sponge, Non-Sterile 4x4 in 1 x Per Week/30 Days  Discharge Instructions: Apply over primary dressing as directed. Com pression Wrap: Unnaboot w/Calamine, 4x10 (in/yd) 1 x Per Week/30 Days Discharge Instructions: Apply Unnaboot as directed. WOUND #4:  - Lower Leg Wound Laterality: Left, Posterior Cleanser: Soap and Water 1 x Per Week/30 Days Discharge Instructions: May shower and wash wound with dial antibacterial soap and water prior to dressing change. Peri-Wound Care: Triamcinolone 15 (g) 1 x Per Week/30 Days Discharge Instructions: Use triamcinolone 15 (g) as directed Peri-Wound Care: Sween Lotion (Moisturizing lotion) 1 x Per Week/30 Days Discharge Instructions: Apply moisturizing lotion as directed Prim Dressing: KerraCel Ag Gelling Fiber Dressing, 4x5 in (silver alginate) 1 x Per Week/30 Days ary Discharge Instructions: Apply silver alginate to wound bed as instructed Secondary Dressing: Woven Gauze Sponge, Non-Sterile 4x4 in 1 x Per Week/30 Days Discharge Instructions: Apply over primary dressing as directed. Com pression Wrap: Unnaboot w/Calamine, 4x10 (in/yd) 1 x Per Week/30 Days Discharge Instructions: Apply Unnaboot as directed. 1. I would recommend currently that we go ahead and continue to monitor for any signs of worsening or infection. Obviously if anything changes patient should contact the office and let me know. 2. I am good recommend as well that the patient continue with the silver alginate dressing where he actually can wrap both legs with Unna boot wrap which I think would be better for this. Based on the fact that we have to open wounds 1 on each leg I think we will wrap both legs. 3. She does have a follow-up appointment with vascular on Friday we will see what they say as far as what they find from a arterial standpoint. Again they can rewrap her following she is planning to see the physician as well following the ABIs. We will see patient back for reevaluation in 1 week here in the clinic. If anything worsens or changes patient will contact our office for additional recommendations. Electronic Signature(s) Signed: 06/23/2022 10:09:55 AM By: Worthy Keeler PA-C Entered By: Worthy Keeler on 06/23/2022  10:09:55 -------------------------------------------------------------------------------- SuperBill Details Patient Name: Date of Service: Brandi Morgans A. 06/23/2022 Medical Record Number: [id number] Patient Account Number: [id number] Date of Birth/Sex: Treating RN: 01/07/1951 (71 y.o. F) Primary Care Provider: [name] Other Clinician: Referring Provider: Treating Provider/Extender: [name], [name] in Treatment: 2 Diagnosis Coding ICD-10 Codes Code Description I89.0 Lymphedema, not elsewhere classified I87.331 Chronic venous hypertension (idiopathic) with ulcer and inflammation of right lower extremity L97.812 Non-pressure chronic ulcer of other part of right lower leg with fat layer exposed E11.622 Type 2 diabetes mellitus with other skin ulcer I73.89 Other specified peripheral vascular diseases I10 Essential (primary) hypertension Z79.01 Long term (current) use of anticoagulants Facility Procedures CPT4 Code: [id number] Description: 29580 - APPLY UNNA BOOT/PROFO BILATERAL Modifier: Quantity: 1 Physician Procedures : CPT4 Code Description Modifier [id number] - WC PHYS LEVEL 3 - EST PT ICD-10 Diagnosis Description I89.0 Lymphedema, not elsewhere classified I87.331 Chronic venous hypertension (idiopathic) with ulcer and inflammation of right lower extremity  L97.812 Non-pressure chronic ulcer of other part of right lower leg with fat layer exposed E11.622 Type 2 diabetes mellitus with other skin ulcer Quantity: 1 Electronic Signature(s) Signed: 06/23/2022 5:27:13 PM By: Worthy Keeler PA-C Signed: 06/23/2022 5:54:22 PM By: Deon Pilling RN, BSN Previous Signature: 06/23/2022 10:10:47 AM Version By: Worthy Keeler PA-C Entered By: Deon Pilling on 06/23/2022 17:11:01

## 2022-06-23 NOTE — Progress Notes (Signed)
From Medial Instep 56 cm 54 cm Ankle Left: Right: Point of Measurement: 10 cm From Medial Instep 29.5 cm 27.4 cm Vascular Assessment Pulses: Dorsalis Pedis Palpable: [Left:Yes] [Right:Yes] Electronic Signature(s) Signed: 06/23/2022 1:33:45 PM By: Deon Pilling RN, BSN Entered By: Deon Pilling on 06/23/2022 09:25:00 -------------------------------------------------------------------------------- Multi-Disciplinary Care Plan Details Patient Name: Date of Service: Brandi Morgans A. 06/23/2022 9:00 [place] Record Number: [id number] Patient Account Number: [id number] Date of Birth/Sex: Treating RN: April 01, 1951 (71 y.o. [name], Tammi Klippel Primary Care Pamella Samons: [name] Other Clinician: Referring Addisson Frate: Treating Arora Coakley/Extender: [name], [name] in Treatment: 2 Active Inactive Pain, Acute or Chronic Nursing Diagnoses: Pain, acute or chronic: actual or potential Potential alteration in comfort, pain Goals: Patient will verbalize adequate pain control and receive pain control interventions during procedures as needed Date Initiated: 06/09/2022 Target Resolution Date:  08/19/2022 Goal Status: Active Patient/caregiver will verbalize comfort level met Date Initiated: 06/09/2022 Target Resolution Date: 08/26/2022 Goal Status: Active Interventions: Encourage patient to take pain medications as prescribed Provide education on pain management Reposition patient for comfort Treatment Activities: Administer pain control measures as ordered : 06/09/2022 Notes: Wound/Skin Impairment Nursing Diagnoses: Knowledge deficit related to ulceration/compromised skin integrity Goals: Patient/caregiver will verbalize understanding of skin care regimen Date Initiated: 06/09/2022 Target Resolution Date: 08/26/2022 Goal Status: Active Interventions: Assess patient/caregiver ability to obtain necessary supplies Assess patient/caregiver ability to perform ulcer/skin care regimen upon admission and as needed Assess ulceration(s) every visit Provide education on ulcer and skin care Treatment Activities: Skin care regimen initiated : 06/09/2022 Topical wound management initiated : 06/09/2022 Notes: Electronic Signature(s) Signed: 06/23/2022 1:33:45 PM By: Deon Pilling RN, BSN Entered By: Deon Pilling on 06/23/2022 09:26:46 -------------------------------------------------------------------------------- Pain Assessment Details Patient Name: Date of Service: Brandi Morgans A. 06/23/2022 9:00 A M Medical Record Number: [id number] Patient Account Number: [id number] Date of Birth/Sex: Treating RN: October 26, 1950 (71 y.o. [name] Primary Care Leith Hedlund: [name] Other Clinician: Referring Cyrilla Durkin: Treating Treyshon Buchanon/Extender: Truett Mainland Weeks in Treatment: 2 Active Problems Location of Pain Severity and Description of Pain Patient Has Paino No Site Locations Rate the pain. Current Pain Level: 0 Pain Management and Medication Current Pain Management: Medication: No Cold Application: No Rest: No Massage: No Activity:  No T.E.N.S.: No Heat Application: No Leg drop or elevation: No Is the Current Pain Management Adequate: Adequate How does your wound impact your activities of daily livingo Sleep: No Bathing: No Appetite: No Relationship With Others: No Bladder Continence: No Emotions: No Bowel Continence: No Work: No Toileting: No Drive: No Dressing: No Hobbies: No Engineer, maintenance) Signed: 06/23/2022 1:33:45 PM By: Deon Pilling RN, BSN Entered By: Deon Pilling on 06/23/2022 09:17:43 -------------------------------------------------------------------------------- Patient/Caregiver Education Details Patient Name: Date of Service: [name] 9/27/2023andnbsp9:00 A M Medical Record Number: [id number] Patient Account Number: [id number] Date of Birth/Gender: Treating RN: 09-29-1950 (71 y.o. [name] Primary Care Physician: [name] Other Clinician: Referring Physician: Treating Physician/Extender: Angelica Pou in Treatment: 2 Education Assessment Education Provided To: Patient Education Topics Provided Pain: Handouts: A Guide to Pain Control Methods: Explain/Verbal Responses: Reinforcements needed Electronic Signature(s) Signed: 06/23/2022 1:33:45 PM By: Deon Pilling RN, BSN Entered By: Deon Pilling on 06/23/2022 09:26:57 -------------------------------------------------------------------------------- Wound Assessment Details Patient Name: Date of Service: Brandi Morgans A. 06/23/2022 9:00 [place] Record Number: [id number] Patient Account Number: [id number] Date of Birth/Sex: Treating RN: Nov 20, 1950 (71 y.o. [name] Primary Care Bright Spielmann:  From Medial Instep 56 cm 54 cm Ankle Left: Right: Point of Measurement: 10 cm From Medial Instep 29.5 cm 27.4 cm Vascular Assessment Pulses: Dorsalis Pedis Palpable: [Left:Yes] [Right:Yes] Electronic Signature(s) Signed: 06/23/2022 1:33:45 PM By: Deon Pilling RN, BSN Entered By: Deon Pilling on 06/23/2022 09:25:00 -------------------------------------------------------------------------------- Multi-Disciplinary Care Plan Details Patient Name: Date of Service: Brandi Morgans A. 06/23/2022 9:00 [place] Record Number: [id number] Patient Account Number: [id number] Date of Birth/Sex: Treating RN: April 01, 1951 (71 y.o. [name], Tammi Klippel Primary Care Pamella Samons: [name] Other Clinician: Referring Addisson Frate: Treating Arora Coakley/Extender: [name], [name] in Treatment: 2 Active Inactive Pain, Acute or Chronic Nursing Diagnoses: Pain, acute or chronic: actual or potential Potential alteration in comfort, pain Goals: Patient will verbalize adequate pain control and receive pain control interventions during procedures as needed Date Initiated: 06/09/2022 Target Resolution Date:  08/19/2022 Goal Status: Active Patient/caregiver will verbalize comfort level met Date Initiated: 06/09/2022 Target Resolution Date: 08/26/2022 Goal Status: Active Interventions: Encourage patient to take pain medications as prescribed Provide education on pain management Reposition patient for comfort Treatment Activities: Administer pain control measures as ordered : 06/09/2022 Notes: Wound/Skin Impairment Nursing Diagnoses: Knowledge deficit related to ulceration/compromised skin integrity Goals: Patient/caregiver will verbalize understanding of skin care regimen Date Initiated: 06/09/2022 Target Resolution Date: 08/26/2022 Goal Status: Active Interventions: Assess patient/caregiver ability to obtain necessary supplies Assess patient/caregiver ability to perform ulcer/skin care regimen upon admission and as needed Assess ulceration(s) every visit Provide education on ulcer and skin care Treatment Activities: Skin care regimen initiated : 06/09/2022 Topical wound management initiated : 06/09/2022 Notes: Electronic Signature(s) Signed: 06/23/2022 1:33:45 PM By: Deon Pilling RN, BSN Entered By: Deon Pilling on 06/23/2022 09:26:46 -------------------------------------------------------------------------------- Pain Assessment Details Patient Name: Date of Service: Brandi Morgans A. 06/23/2022 9:00 A M Medical Record Number: [id number] Patient Account Number: [id number] Date of Birth/Sex: Treating RN: October 26, 1950 (71 y.o. [name] Primary Care Leith Hedlund: [name] Other Clinician: Referring Cyrilla Durkin: Treating Treyshon Buchanon/Extender: Truett Mainland Weeks in Treatment: 2 Active Problems Location of Pain Severity and Description of Pain Patient Has Paino No Site Locations Rate the pain. Current Pain Level: 0 Pain Management and Medication Current Pain Management: Medication: No Cold Application: No Rest: No Massage: No Activity:  No T.E.N.S.: No Heat Application: No Leg drop or elevation: No Is the Current Pain Management Adequate: Adequate How does your wound impact your activities of daily livingo Sleep: No Bathing: No Appetite: No Relationship With Others: No Bladder Continence: No Emotions: No Bowel Continence: No Work: No Toileting: No Drive: No Dressing: No Hobbies: No Engineer, maintenance) Signed: 06/23/2022 1:33:45 PM By: Deon Pilling RN, BSN Entered By: Deon Pilling on 06/23/2022 09:17:43 -------------------------------------------------------------------------------- Patient/Caregiver Education Details Patient Name: Date of Service: [name] 9/27/2023andnbsp9:00 A M Medical Record Number: [id number] Patient Account Number: [id number] Date of Birth/Gender: Treating RN: 09-29-1950 (71 y.o. [name] Primary Care Physician: [name] Other Clinician: Referring Physician: Treating Physician/Extender: Angelica Pou in Treatment: 2 Education Assessment Education Provided To: Patient Education Topics Provided Pain: Handouts: A Guide to Pain Control Methods: Explain/Verbal Responses: Reinforcements needed Electronic Signature(s) Signed: 06/23/2022 1:33:45 PM By: Deon Pilling RN, BSN Entered By: Deon Pilling on 06/23/2022 09:26:57 -------------------------------------------------------------------------------- Wound Assessment Details Patient Name: Date of Service: Brandi Morgans A. 06/23/2022 9:00 [place] Record Number: [id number] Patient Account Number: [id number] Date of Birth/Sex: Treating RN: Nov 20, 1950 (71 y.o. [name] Primary Care Bright Spielmann:  [name] Other Clinician: Referring Hansen Carino: Treating Quanetta Truss/Extender: [name], [name] in Treatment: 2 Wound Status Wound Number: 3 Primary Lymphedema Etiology: Wound Location: Right, Posterior Lower Leg Wound  Open Wounding Event: Blister Status: Date Acquired: 05/26/2022 Comorbid Cataracts, Sleep Apnea, Arrhythmia, Coronary Artery Disease, Weeks Of Treatment: 2 History: Hypertension, Peripheral Venous Disease, Type II Diabetes, Clustered Wound: Yes Osteoarthritis, Neuropathy Photos Wound Measurements Length: (cm) 0.1 Width: (cm) 0.1 Depth: (cm) 0.1 Clustered Quantity: 1 Area: (cm) 0.008 Volume: (cm) 0.001 % Reduction in Area: 99.9% % Reduction in Volume: 99.8% Epithelialization: Medium (34-66%) Tunneling: No Undermining: No Wound Description Classification: Full Thickness Without Exposed Support Structures Wound Margin: Distinct, outline attached Exudate Amount: None Present Foul Odor After Cleansing: No Slough/Fibrino No Wound Bed Granulation Amount: None Present (0%) Exposed Structure Necrotic Amount: None Present (0%) Fascia Exposed: No Fat Layer (Subcutaneous Tissue) Exposed: No Tendon Exposed: No Muscle Exposed: No Joint Exposed: No Bone Exposed: No Treatment Notes Wound #3 (Lower Leg) Wound Laterality: Right, Posterior Cleanser Soap and Water Discharge Instruction: May shower and wash wound with dial antibacterial soap and water prior to dressing change. Peri-Wound Care Triamcinolone 15 (g) Discharge Instruction: Use triamcinolone 15 (g) as directed Sween Lotion (Moisturizing lotion) Discharge Instruction: Apply moisturizing lotion as directed Topical Primary Dressing KerraCel Ag Gelling Fiber Dressing, 4x5 in (silver alginate) Discharge Instruction: Apply silver alginate to wound bed as instructed Secondary Dressing Woven Gauze Sponge, Non-Sterile 4x4 in Discharge Instruction: Apply over primary dressing as directed. Secured With Compression Wrap Unnaboot w/Calamine, 4x10 (in/yd) Discharge Instruction: Apply Unnaboot as directed. Compression Stockings Add-Ons Electronic Signature(s) Signed: 06/23/2022 1:33:45 PM By: Deon Pilling RN, BSN Signed:  06/23/2022 4:38:36 PM By: Erenest Blank Entered By: Erenest Blank on 06/23/2022 09:27:42 -------------------------------------------------------------------------------- Wound Assessment Details Patient Name: Date of Service: Brandi Morgans A. 06/23/2022 9:00 A M Medical Record Number: [id number] Patient Account Number: [id number] Date of Birth/Sex: Treating RN: June 09, 1951 (72 y.o. [name], [name] Primary Care Sebastain Fishbaugh: [name] Other Clinician: Referring Junior Huezo: Treating Lianna Sitzmann/Extender: [name], [name] in Treatment: 2 Wound Status Wound Number: 4 Primary Lymphedema Etiology: Wound Location: Left, Posterior Lower Leg Secondary Diabetic Wound/Ulcer of the Lower Extremity Wounding Event: Gradually Appeared Etiology: Date Acquired: 06/16/2022 Wound Open Weeks Of Treatment: 0 Status: Clustered Wound: No Comorbid Cataracts, Sleep Apnea, Arrhythmia, Coronary Artery Disease, History: Hypertension, Peripheral Venous Disease, Type II Diabetes, Osteoarthritis, Neuropathy Photos Wound Measurements Length: (cm) 0.7 Width: (cm) 0.6 Depth: (cm) 0.1 Area: (cm) 0.33 Volume: (cm) 0.033 % Reduction in Area: % Reduction in Volume: Epithelialization: Small (1-33%) Tunneling: No Undermining: No Wound Description Classification: Full Thickness Without Exposed Support Structures Wound Margin: Distinct, outline attached Exudate Amount: Medium Exudate Type: Serosanguineous Exudate Color: red, brown Foul Odor After Cleansing: No Slough/Fibrino Yes Wound Bed Granulation Amount: Medium (34-66%) Exposed Structure Granulation Quality: Red Fascia Exposed: No Necrotic Amount: Medium (34-66%) Fat Layer (Subcutaneous Tissue) Exposed: Yes Necrotic Quality: Adherent Slough Tendon Exposed: No Muscle Exposed: No Joint Exposed: No Bone Exposed: No Treatment Notes Wound #4 (Lower Leg) Wound Laterality: Left, Posterior Cleanser Soap and  Water Discharge Instruction: May shower and wash wound with dial antibacterial soap and water prior to dressing change. Peri-Wound Care Triamcinolone 15 (g) Discharge Instruction: Use triamcinolone 15 (g) as directed Sween Lotion (Moisturizing lotion) Discharge Instruction: Apply moisturizing lotion as directed Topical Primary Dressing KerraCel Ag Gelling Fiber Dressing, 4x5 in (silver alginate) Discharge Instruction: Apply silver alginate to wound bed as instructed Secondary Dressing Woven Gauze  From Medial Instep 56 cm 54 cm Ankle Left: Right: Point of Measurement: 10 cm From Medial Instep 29.5 cm 27.4 cm Vascular Assessment Pulses: Dorsalis Pedis Palpable: [Left:Yes] [Right:Yes] Electronic Signature(s) Signed: 06/23/2022 1:33:45 PM By: Deon Pilling RN, BSN Entered By: Deon Pilling on 06/23/2022 09:25:00 -------------------------------------------------------------------------------- Multi-Disciplinary Care Plan Details Patient Name: Date of Service: Brandi Morgans A. 06/23/2022 9:00 [place] Record Number: [id number] Patient Account Number: [id number] Date of Birth/Sex: Treating RN: April 01, 1951 (71 y.o. [name], Tammi Klippel Primary Care Pamella Samons: [name] Other Clinician: Referring Addisson Frate: Treating Arora Coakley/Extender: [name], [name] in Treatment: 2 Active Inactive Pain, Acute or Chronic Nursing Diagnoses: Pain, acute or chronic: actual or potential Potential alteration in comfort, pain Goals: Patient will verbalize adequate pain control and receive pain control interventions during procedures as needed Date Initiated: 06/09/2022 Target Resolution Date:  08/19/2022 Goal Status: Active Patient/caregiver will verbalize comfort level met Date Initiated: 06/09/2022 Target Resolution Date: 08/26/2022 Goal Status: Active Interventions: Encourage patient to take pain medications as prescribed Provide education on pain management Reposition patient for comfort Treatment Activities: Administer pain control measures as ordered : 06/09/2022 Notes: Wound/Skin Impairment Nursing Diagnoses: Knowledge deficit related to ulceration/compromised skin integrity Goals: Patient/caregiver will verbalize understanding of skin care regimen Date Initiated: 06/09/2022 Target Resolution Date: 08/26/2022 Goal Status: Active Interventions: Assess patient/caregiver ability to obtain necessary supplies Assess patient/caregiver ability to perform ulcer/skin care regimen upon admission and as needed Assess ulceration(s) every visit Provide education on ulcer and skin care Treatment Activities: Skin care regimen initiated : 06/09/2022 Topical wound management initiated : 06/09/2022 Notes: Electronic Signature(s) Signed: 06/23/2022 1:33:45 PM By: Deon Pilling RN, BSN Entered By: Deon Pilling on 06/23/2022 09:26:46 -------------------------------------------------------------------------------- Pain Assessment Details Patient Name: Date of Service: Brandi Morgans A. 06/23/2022 9:00 A M Medical Record Number: [id number] Patient Account Number: [id number] Date of Birth/Sex: Treating RN: October 26, 1950 (71 y.o. [name] Primary Care Leith Hedlund: [name] Other Clinician: Referring Cyrilla Durkin: Treating Treyshon Buchanon/Extender: Truett Mainland Weeks in Treatment: 2 Active Problems Location of Pain Severity and Description of Pain Patient Has Paino No Site Locations Rate the pain. Current Pain Level: 0 Pain Management and Medication Current Pain Management: Medication: No Cold Application: No Rest: No Massage: No Activity:  No T.E.N.S.: No Heat Application: No Leg drop or elevation: No Is the Current Pain Management Adequate: Adequate How does your wound impact your activities of daily livingo Sleep: No Bathing: No Appetite: No Relationship With Others: No Bladder Continence: No Emotions: No Bowel Continence: No Work: No Toileting: No Drive: No Dressing: No Hobbies: No Engineer, maintenance) Signed: 06/23/2022 1:33:45 PM By: Deon Pilling RN, BSN Entered By: Deon Pilling on 06/23/2022 09:17:43 -------------------------------------------------------------------------------- Patient/Caregiver Education Details Patient Name: Date of Service: [name] 9/27/2023andnbsp9:00 A M Medical Record Number: [id number] Patient Account Number: [id number] Date of Birth/Gender: Treating RN: 09-29-1950 (71 y.o. [name] Primary Care Physician: [name] Other Clinician: Referring Physician: Treating Physician/Extender: Angelica Pou in Treatment: 2 Education Assessment Education Provided To: Patient Education Topics Provided Pain: Handouts: A Guide to Pain Control Methods: Explain/Verbal Responses: Reinforcements needed Electronic Signature(s) Signed: 06/23/2022 1:33:45 PM By: Deon Pilling RN, BSN Entered By: Deon Pilling on 06/23/2022 09:26:57 -------------------------------------------------------------------------------- Wound Assessment Details Patient Name: Date of Service: Brandi Morgans A. 06/23/2022 9:00 [place] Record Number: [id number] Patient Account Number: [id number] Date of Birth/Sex: Treating RN: Nov 20, 1950 (71 y.o. [name] Primary Care Bright Spielmann:

## 2022-06-24 ENCOUNTER — Ambulatory Visit (INDEPENDENT_AMBULATORY_CARE_PROVIDER_SITE_OTHER): Payer: HMO

## 2022-06-24 ENCOUNTER — Ambulatory Visit: Payer: HMO | Attending: Cardiovascular Disease | Admitting: Cardiovascular Disease

## 2022-06-24 ENCOUNTER — Encounter: Payer: Self-pay | Admitting: Cardiovascular Disease

## 2022-06-24 VITALS — BP 118/66 | HR 92 | Ht 63.0 in | Wt 286.0 lb

## 2022-06-24 DIAGNOSIS — I10 Essential (primary) hypertension: Secondary | ICD-10-CM

## 2022-06-24 DIAGNOSIS — Z5181 Encounter for therapeutic drug level monitoring: Secondary | ICD-10-CM

## 2022-06-24 DIAGNOSIS — Z7901 Long term (current) use of anticoagulants: Secondary | ICD-10-CM | POA: Diagnosis not present

## 2022-06-24 DIAGNOSIS — D6869 Other thrombophilia: Secondary | ICD-10-CM

## 2022-06-24 DIAGNOSIS — I2510 Atherosclerotic heart disease of native coronary artery without angina pectoris: Secondary | ICD-10-CM | POA: Diagnosis not present

## 2022-06-24 DIAGNOSIS — I739 Peripheral vascular disease, unspecified: Secondary | ICD-10-CM | POA: Diagnosis not present

## 2022-06-24 DIAGNOSIS — E1151 Type 2 diabetes mellitus with diabetic peripheral angiopathy without gangrene: Secondary | ICD-10-CM

## 2022-06-24 DIAGNOSIS — I480 Paroxysmal atrial fibrillation: Secondary | ICD-10-CM

## 2022-06-24 DIAGNOSIS — I5032 Chronic diastolic (congestive) heart failure: Secondary | ICD-10-CM

## 2022-06-24 DIAGNOSIS — Z0289 Encounter for other administrative examinations: Secondary | ICD-10-CM | POA: Diagnosis not present

## 2022-06-24 DIAGNOSIS — E7800 Pure hypercholesterolemia, unspecified: Secondary | ICD-10-CM | POA: Diagnosis not present

## 2022-06-24 LAB — POCT INR: INR: 3.2 — AB (ref 2.0–3.0)

## 2022-06-24 MED ORDER — SIMVASTATIN 40 MG PO TABS: ORAL_TABLET | ORAL | 3 refills | Status: DC

## 2022-06-24 MED ORDER — PANTOPRAZOLE SODIUM 40 MG PO TBEC: 40.0000 mg | DELAYED_RELEASE_TABLET | Freq: Every day | ORAL | 3 refills | Status: DC

## 2022-06-24 NOTE — Patient Instructions (Signed)
Medication Instructions:  No changes *If you need a refill on your cardiac medications before your next appointment, please call your pharmacy*   Lab Work: None ordered If you have labs (blood work) drawn today and your tests are completely normal, you will receive your results only by: [place] (if you have MyChart) OR A paper copy in the mail If you have any lab test that is abnormal or we need to change your treatment, we will call you to review the results.   Testing/Procedures: None ordered3   Follow-Up: At [facility], you and your health needs are our priority.  As part of our continuing mission to provide you with exceptional heart care, we have created designated Provider Care Teams.  These Care Teams include your primary Cardiologist (physician) and Advanced Practice Providers (APPs -  Physician Assistants and Nurse Practitioners) who all work together to provide you with the care you need, when you need it.  We recommend signing up for the patient portal called "MyChart".  Sign up information is provided on this After Visit Summary.  MyChart is used to connect with patients for Virtual Visits (Telemedicine).  Patients are able to view lab/test results, encounter notes, upcoming appointments, etc.  Non-urgent messages can be sent to your provider as well.   To learn more about what you can do with MyChart, go to [email or web address].    Your next appointment:   6 month(s)  The format for your next appointment:   In Person  Provider:   Sanda Klein, MD       Important Information About Sugar

## 2022-06-24 NOTE — Patient Instructions (Signed)
DECREASE TO 1 tablet daily except for 1/2 tablet on Monday, Wednesday and Friday. Recheck INR in 4 weeks. [facility] [phone]

## 2022-06-24 NOTE — Progress Notes (Signed)
Cardiology Office note   Patient Name: [name] Date of Encounter: 06/29/2022  Primary Care Provider:  [name], MD Primary Cardiologist:  Sanda Klein, MD  Patient Profile    [name] is a 71 y.o. female who presents for a follow-up evaluation of her CHF, CAD and atrial fibrillation  Past Medical History    Past Medical History:  Diagnosis Date   Arthritis    "knees" (02/05/2016)   Cancer ([place]) 5/10   LUL Vats    CHF (congestive heart failure) ([place])    Coronary artery disease    GERD (gastroesophageal reflux disease)    tx meds   H/O hiatal hernia    History of blood transfusion "several"   last april 2015 s/p knee surgery (02/05/2016)   Hypercholesteremia    Hypertension    Lung cancer ([place])    2010, surgery 18% left lung no radiation or chemo   Non-STEMI (non-ST elevated myocardial infarction) ([place]) 04/07/2012   See cath results below   Paroxysmal atrial fibrillation ([place]) 04/07/2012   On Warfarin   Peripheral vascular disease ([place]) 8/12   Lt SFA PTA   Presence of stent in LAD coronary artery 04/07/2012   Xience Expedition DES 2.75 mm x 18 mm (dilated to 3.0 mm)   Sleep apnea    does not wear CPAP   Type II diabetes mellitus ([place])    insulin dependent   Past Surgical History:  Procedure Laterality Date   ABDOMINAL HYSTERECTOMY  1990   BREAST EXCISIONAL BIOPSY Right    CARDIAC CATHETERIZATION  11/04/2008   patent RCA, LM, and Circ, nl EF   CARDIAC CATHETERIZATION  02/20/2002   patent coronaries with the only abnormality being a smooth luminla irregularity in the mid intermediate ramus branch no felt to be hemodynamically significant, nl LV   CARDIAC CATHETERIZATION N/A 02/05/2016   Procedure: Left Heart Cath and Coronary Angiography;  Surgeon: Leonie Man, MD;  Location: [facility];  Service: Cardiovascular;  Laterality: N/A;   CARDIAC CATHETERIZATION N/A 02/05/2016   Procedure: Coronary Stent Intervention;  Surgeon:  Leonie Man, MD;  Location: [facility];  Service: Cardiovascular;  Laterality: N/A;   CARDIOVERSION N/A 07/16/2021   Procedure: CARDIOVERSION;  Surgeon: Werner Lean, MD;  Location: [facility];  Service: Cardiovascular;  Laterality: N/A;   CATARACT EXTRACTION W/ INTRAOCULAR LENS  IMPLANT, BILATERAL  2013   CORONARY ANGIOPLASTY WITH STENT PLACEMENT  04/07/2012   Xience Expedition DES 2.45mm x 18 mm (dilated to 3.0 mm) to the prox LAD    CORONARY ANGIOPLASTY WITH STENT PLACEMENT  2013; 2015   CORONARY ARTERY BYPASS GRAFT N/A 08/17/2017   Procedure: CORONARY ARTERY BYPASS GRAFTING (CABG) TIMES 1 USING LEFT INTERNAL MAMMARY ARTERY;  Surgeon: Melrose Nakayama, MD;  Location: [facility];  Service: Open Heart Surgery;  Laterality: N/A;   DILATION AND CURETTAGE OF UTERUS  <1990   ESOPHAGOGASTRODUODENOSCOPY N/A 05/23/2015   Procedure: ESOPHAGOGASTRODUODENOSCOPY (EGD);  Surgeon: Carol Ada, MD;  Location: [facility];  Service: Endoscopy;  Laterality: N/A;   ESOPHAGOGASTRODUODENOSCOPY (EGD) WITH PROPOFOL N/A 06/13/2015   Procedure: ESOPHAGOGASTRODUODENOSCOPY (EGD) WITH PROPOFOL;  Surgeon: Carol Ada, MD;  Location: [facility];  Service: Endoscopy;  Laterality: N/A;   ESOPHAGOGASTRODUODENOSCOPY (EGD) WITH PROPOFOL N/A 04/07/2018   Procedure: ESOPHAGOGASTRODUODENOSCOPY (EGD) WITH PROPOFOL;  Surgeon: Carol Ada, MD;  [facility]: [facility];  Service: Endoscopy;  Laterality: N/A;  fluoroscopy is needed   FRACTURE SURGERY  HAMMER TOE SURGERY Bilateral 1993   with screws, on screw removed   JOINT REPLACEMENT     KNEE ARTHROSCOPY Bilateral    left x2, right x1   LAPAROSCOPIC CHOLECYSTECTOMY     LEFT HEART CATH AND CORONARY ANGIOGRAPHY N/A 08/11/2017   Procedure: LEFT HEART CATH AND CORONARY ANGIOGRAPHY;  Surgeon: Martinique, Peter M, MD;  Location: [facility];  Service: Cardiovascular;  Laterality: N/A;   LEFT HEART CATHETERIZATION WITH CORONARY ANGIOGRAM N/A 04/07/2012    Procedure: LEFT HEART CATHETERIZATION WITH CORONARY ANGIOGRAM;  Surgeon: [name], MD;  Location: [facility];  Service: Cardiovascular;  Laterality: N/A;   LEFT HEART CATHETERIZATION WITH CORONARY ANGIOGRAM N/A 05/27/2014   Procedure: LEFT HEART CATHETERIZATION WITH CORONARY ANGIOGRAM;  Surgeon: [name], MD; LAD 99% ISR, CFX 50-60%, RCA (dominant) no sig dz, EF 60%   LOWER EXTREMITY ANGIOGRAM  05/17/11   directional atherectomy to the prox L SFA using a LX Man TurboHawk, ballooned with a [place] balloon    ORIF ANKLE FRACTURE  07/09/2012   Procedure: OPEN REDUCTION INTERNAL FIXATION (ORIF) ANKLE FRACTURE;  Surgeon: Meredith Pel, MD;  [facility];  Service: Orthopedics;  Laterality: Left;  open reduction internal fixation trimalleolar ankle fracture medial malleolous fixation   PERCUTANEOUS CORONARY STENT INTERVENTION (PCI-S) N/A 04/07/2012   Procedure: PERCUTANEOUS CORONARY STENT INTERVENTION (PCI-S);  Surgeon: [name], MD;  Location: [facility];  Service: Cardiovascular;  Laterality: N/A;   PERCUTANEOUS CORONARY STENT INTERVENTION (PCI-S)  05/27/2014   Procedure: PERCUTANEOUS CORONARY STENT INTERVENTION (PCI-S);  Surgeon: [name], MD; 3 mm x 12 mm long Xience Xpedition DES to the proximal LAD   SAVORY DILATION N/A 06/13/2015   Procedure: SAVORY DILATION;  Surgeon: Carol Ada, MD;  [facility];  Service: Endoscopy;  Laterality: N/A;   SAVORY DILATION N/A 04/07/2018   Procedure: SAVORY DILATION;  Surgeon: Carol Ada, MD;  [facility];  Service: Endoscopy;  Laterality: N/A;   TEE WITHOUT CARDIOVERSION N/A 08/17/2017   Procedure: TRANSESOPHAGEAL ECHOCARDIOGRAM (TEE);  Surgeon: Melrose Nakayama, MD;  Location: [facility];  Service: Open Heart Surgery;  Laterality: N/A;   TEE WITHOUT CARDIOVERSION N/A 07/16/2021   Procedure: TRANSESOPHAGEAL ECHOCARDIOGRAM (TEE);  Surgeon: Werner Lean, MD;  Location: [facility];   Service: Cardiovascular;  Laterality: N/A;   TONSILLECTOMY     TOTAL KNEE ARTHROPLASTY Left 2003   TOTAL KNEE REVISION Left 11/05/2013   Procedure: LEFT TOTAL KNEE RESECTION;  Surgeon: Mauri Pole, MD;  [facility]: [facility];  Service: Orthopedics;  Laterality: Left;   TOTAL KNEE REVISION Left 2007   opened in 2006 and cleaned out, 2007 revision   TOTAL KNEE REVISION Left 12/31/2013   Procedure: RE-INPLANTATION LEFT TOTAL KNEE ;  Surgeon: Mauri Pole, MD;  [facility]: [facility];  Service: Orthopedics;  Laterality: Left;   VIDEO ASSISTED THORACOSCOPY (VATS)/ LOBECTOMY  01/2009    Allergies  Allergies  Allergen Reactions   Levofloxacin Itching   Morphine And Related Itching    Pt reports oxycodone history with no allergy.    History of Present Illness    Ms. Bertucci PMH of CABG (LIMA-LAD 11/18), PAD, chronic diastolic CHF, and paroxysmal atrial fibrillation (TEE-cardioversion October 2022).  Her PMH also includes type 2 diabetes on insulin, peripheral venous insufficiency, bilateral knee degenerative joint disease and morbid obesity.  Biggest problem is worsening lower extremity edema.  Both her legs are wrapped in compressive bandages today because she has  superficial skin ulcerations in both calves. She has chronic problems with lower extremity edema and has bilateral venous reflux.  She denies orthopnea, PND or exertional dyspnea (she is very sedentary/nonambulatory).  Her weight is within 5 pounds of her weight at her previous appointment in the spring.    She is scheduled for ABI due to concern about the healing of her skin wounds and a follow-up visit with Dr. Orlie Pollen on September 29 (addendum ABI was noncompressible bilaterally, monophasic flow).  She has not had any chest pain.  Has not had any palpitations to suggest atrial fibrillation since her last appointment.  Has not had any falls or bleeding problems   She underwent CABG November 2018 for recurrent in-stent restenosis  with multiple PCI.  (PCI with DES to LAD 2013, 2015 DES to distal LAD, 4/17 received DES in proximal LAD, 11/18 again with 3 stenosis and was referred for single-vessel CABG).  She is also status post left superficial femoral artery atherectomy.  Hospitalized with symptomatic atrial fibrillation rapid ventricular response in October 2022 when she underwent TEE guided cardioversion.   Home Medications    Prior to Admission medications   Medication Sig Start Date End Date Taking? Authorizing Provider  furosemide (LASIX) 40 MG tablet Take 40 mg by mouth as needed.    [provider]  insulin NPH-regular Human (70-30) 100 UNIT/ML injection Inject into the skin. 60 units in the morning and 20 units at bedtme    [provider]  lisinopril-hydrochlorothiazide (PRINZIDE,ZESTORETIC) 20-25 MG per tablet Take 1 tablet by mouth daily.    [provider]  metoCLOPramide (REGLAN) 10 MG tablet Take 10 mg by mouth 2 (two) times daily. Per patient    [provider]  metoprolol tartrate (LOPRESSOR) 50 MG tablet 50 mg in the morning and 25 mg at bedtime    [provider]  pantoprazole (PROTONIX) 40 MG tablet Take 1 tablet (40 mg total) by mouth daily. 06/13/20   Kiasia Chou, MD  simvastatin (ZOCOR) 40 MG tablet Take 1 tablet (40 mg total) by mouth daily at 6 PM. 04/01/20   Woodrow Drab, MD  spironolactone (ALDACTONE) 25 MG tablet Take 25 mg by mouth as needed.    [provider]  Vitamin D, Cholecalciferol, 1000 units TABS Take 1,000 Units by mouth every morning.     [provider]  warfarin (COUMADIN) 7.5 MG tablet TAKE 1/2 TO 1 TABLET DAILY AS DIRECTED BY COUMADIN CLINIC 06/13/20   Madalyne Husk, MD    Family History    Family History  Problem Relation Age of Onset   Hypertension Mother    Coronary artery disease Brother    Hypertension Sister    She indicated that her mother is deceased. She indicated that her father is deceased. She  indicated that the status of her sister is unknown. She indicated that the status of her brother is unknown.  Social History    Social History   Socioeconomic History   Marital status: Single    Spouse name: Not on file   Number of children: Not on file   Years of education: Not on file   Highest education level: Not on file  Occupational History   Occupation: Retired  Tobacco Use   Smoking status: Former    Packs/day: 1.00    Years: 20.00    Total pack years: 20.00    Types: Cigarettes    Quit date: 09/27/1984    Years since quitting: 19.7  Smokeless tobacco: Never  Vaping Use   Vaping Use: Never used  Substance and Sexual Activity   Alcohol use: No   Drug use: No   Sexual activity: Not on file  Other Topics Concern   Not on file  Social History Narrative   Patient lives alone.   Social Determinants of Health   Financial Resource Strain: Not on file  Food Insecurity: No Food Insecurity (05/07/2020)   Hunger Vital Sign    Worried About Running Out of Food in the Last Year: Never true    Ran Out of Food in the Last Year: Never true  Transportation Needs: No Transportation Needs (05/07/2020)   PRAPARE - Hydrologist (Medical): No    Lack of Transportation (Non-Medical): No  Physical Activity: Not on file  Stress: Stress Concern Present (11/14/2019)   [place]    Feeling of Stress : Very much  Social Connections: Not on file  Intimate Partner Violence: Not on file     Review of Systems    General:  No chills, fever, night sweats or weight changes.  Cardiovascular:  No chest pain, dyspnea on exertion, edema, orthopnea, palpitations, paroxysmal nocturnal dyspnea. Dermatological: No rash, lesions/masses Respiratory: No cough, dyspnea Urologic: No hematuria, dysuria Abdominal:   No nausea, vomiting, diarrhea, bright red blood per rectum, melena, or hematemesis Neurologic:   No visual changes, wkns, changes in mental status. All other systems reviewed and are otherwise negative except as noted above.  Physical Exam     VS:  BP 118/66 (BP Location: Left Arm, Patient Position: Sitting, Cuff Size: Large)   Pulse 92   Ht 5\' 3"  (1.6 m)   Wt 286 lb (129.7 kg) Comment: Pt states last weighed three weeks ago  SpO2 99%   BMI 50.66 kg/m  , BMI Body mass index is 50.66 kg/m.    General: Alert, oriented x3, no distress, superobese Head: no evidence of trauma, PERRL, EOMI, no exophtalmos or lid lag, no myxedema, no xanthelasma; normal ears, nose and oropharynx Neck: normal jugular venous pulsations and no hepatojugular reflux; brisk carotid pulses without delay and no carotid bruits Chest: clear to auscultation, no signs of consolidation by percussion or palpation, normal fremitus, symmetrical and full respiratory excursions Cardiovascular: normal position and quality of the apical impulse, regular rhythm, normal first and second heart sounds, no murmurs, rubs or gallops Abdomen: no tenderness or distention, no masses by palpation, no abnormal pulsatility or arterial bruits, normal bowel sounds, no hepatosplenomegaly Extremities: no clubbing, cyanosis; bilateral [place] compression bandages Neurological: grossly nonfocal Psych: Normal mood and affect   Accessory Clinical Findings    Recent Labs: 07/14/2021: B Natriuretic Peptide 163.5; TSH 2.181 07/15/2021: ALT 26; BUN 14; Creatinine, Ser 1.10; Potassium 3.9; Sodium 134 07/17/2021: Hemoglobin 10.9; Platelets 234  Hemoglobin A1c 8.9%  Recent Lipid Panel    Component Value Date/Time   CHOL 115 07/15/2021 0348   TRIG 59 07/15/2021 0348   HDL 44 07/15/2021 0348   CHOLHDL 2.6 07/15/2021 0348   VLDL 12 07/15/2021 0348   LDLCALC 59 07/15/2021 0348   LDLDIRECT 143 (H) 10/10/2008 2042    ECG: Ordered today and personally reviewed shows sinus rhythm, right bundle branch block, voltage criteria for LVH, QTc prolonged  at 511 ms  Echo 07/15/2021 1. Left ventricular ejection fraction, by estimation, is 55 to 60%. The  left ventricle has normal function. The left ventricle has no regional  wall  motion abnormalities. There is moderate asymmetric left ventricular  hypertrophy of the septal segment.  Left ventricular diastolic parameters are indeterminate.   2. Right ventricular systolic function is mildly reduced. The right  ventricular size is mildly enlarged.   3. Right atrial size was mildly dilated.   4. The mitral valve is normal in structure. No evidence of mitral valve  regurgitation. No evidence of mitral stenosis.   5. Tricuspid valve regurgitation is moderate.   6. The aortic valve is normal in structure. Aortic valve regurgitation is  not visualized. Mild aortic valve sclerosis is present, with no evidence  of aortic valve stenosis.   7. The inferior vena cava is normal in size with greater than 50%  respiratory variability, suggesting right atrial pressure of 3 mmHg.    TEE with cardioversion  1. Left ventricular ejection fraction, by estimation, is 55 to 60%. The  left ventricle has normal function.   2. Right ventricular systolic function is normal. The right ventricular  size is normal.   3. Left atrial size was moderately dilated. No left atrial/left atrial  appendage thrombus was detected.   4. The mitral valve is normal in structure. Trivial mitral valve  regurgitation.   5. Tricuspid valve regurgitation is moderate.   6. The aortic valve is tricuspid. Aortic valve regurgitation is not  visualized.   7. Limited study performed: history of esophageal stricture s/p prior  dilation. Slight resistance when attempting gastric imaging so this was  deferred.    ;;cardioverted x 1 with 200J of biphasic synchronized energy.  The patient converted to NSR;;   Assessment & Plan   1. Chronic diastolic heart failure (HCC)   2. Paroxysmal atrial fibrillation ([place])   3. Acquired  thrombophilia ([place])   4. Coronary artery disease involving native coronary artery of native heart without angina pectoris   5. Hypercholesterolemia   6. DM (diabetes mellitus), type 2 with peripheral vascular complications ([place])   7. PAD (peripheral artery disease) ([place])   8. Essential hypertension       1. Chronic diastolic CHF: Most of the edema is related to peripheral venous insufficiency/lipedema always been very difficult to evaluate due to morbid obesity which limits the exam and extremely sedentary lifestyle.  She is essentially wheelchair-bound.  She is within close range of her previous weight (we had set to 185 pounds as the upper limit of desirable fluid weight).  I do not think she is in an episode of acute heart failure.  Continue same medications.  She is taking a very small dose of loop diuretics and is plenty of room to increase this if necessary. 2.  Paroxysmal/persistent AFib: No clinically evident events since her cardioversion in October 2022.  Sometimes presents with an atypical atrial flutter with variable degrees of AV block, seems to have good ventricular rate control.   She has a standing prescription for metoprolol succinate plus an additional "as needed" prescription for metoprolol tartrate for rapid heartbeats.  Would be concerned if she has protracted heart rates lasting more than 24 hours due to the risk of heart failure decompensation.  CHA2DS2-VASc 6 (age, gender, diabetes, heart failure, CAD/PAD, hypertension) 3.  Anticoagulation: She has not had a subtherapeutic INR since January.  Slightly supratherapeutic today at 3.2.  No bleeding problems. 4. Coronary artery disease status post CABG: Does not have angina pectoris.  Lipid profile in desirable range.  Not on aspirin due to anticoagulation with warfarin. 5. HLP: LDL 59 when last checked.  Continue statin. 6. DM: Glycemic control is a little better but still not in target range at 8.5% requiring insulin.  Not a great  candidate for SGLT2 inhibitors due to increased risk of bacterial and fungal infections. 7. PAD: Recently seen by Dr. Virl Cagey in the vascular clinic 8.  Essential hypertension: Controlled, continue same medications..  Patient Instructions  Medication Instructions:  No changes *If you need a refill on your cardiac medications before your next appointment, please call your pharmacy*   Lab Work: None ordered If you have labs (blood work) drawn today and your tests are completely normal, you will receive your results only by: [place] (if you have MyChart) OR A paper copy in the mail If you have any lab test that is abnormal or we need to change your treatment, we will call you to review the results.   Testing/Procedures: None ordered3   Follow-Up: At [facility], you and your health needs are our priority.  As part of our continuing mission to provide you with exceptional heart care, we have created designated Provider Care Teams.  These Care Teams include your primary Cardiologist (physician) and Advanced Practice Providers (APPs -  Physician Assistants and Nurse Practitioners) who all work together to provide you with the care you need, when you need it.  We recommend signing up for the patient portal called "MyChart".  Sign up information is provided on this After Visit Summary.  MyChart is used to connect with patients for Virtual Visits (Telemedicine).  Patients are able to view lab/test results, encounter notes, upcoming appointments, etc.  Non-urgent messages can be sent to your provider as well.   To learn more about what you can do with MyChart, go to [email or web address].    Your next appointment:   6 month(s)  The format for your next appointment:   In Person  Provider:   Sanda Klein, MD       Important Information About Sugar

## 2022-06-25 ENCOUNTER — Ambulatory Visit (HOSPITAL_COMMUNITY)
Admission: RE | Admit: 2022-06-25 | Discharge: 2022-06-25 | Disposition: A | Discharge status: Home / Self Care | Payer: HMO | Source: Ambulatory Visit | Attending: Vascular Surgery | Admitting: Vascular Surgery

## 2022-06-25 ENCOUNTER — Encounter: Payer: Self-pay | Admitting: Vascular Surgery

## 2022-06-25 ENCOUNTER — Ambulatory Visit: Payer: HMO | Admitting: Vascular Surgery

## 2022-06-25 VITALS — BP 157/83 | HR 85 | Temp 97.8°F | Resp 20 | Ht 63.0 in | Wt 286.0 lb

## 2022-06-25 DIAGNOSIS — I739 Peripheral vascular disease, unspecified: Secondary | ICD-10-CM

## 2022-06-25 DIAGNOSIS — I872 Venous insufficiency (chronic) (peripheral): Secondary | ICD-10-CM | POA: Diagnosis not present

## 2022-06-29 DIAGNOSIS — H43813 Vitreous degeneration, bilateral: Secondary | ICD-10-CM | POA: Diagnosis not present

## 2022-06-29 DIAGNOSIS — H04123 Dry eye syndrome of bilateral lacrimal glands: Secondary | ICD-10-CM | POA: Diagnosis not present

## 2022-06-29 DIAGNOSIS — H26491 Other secondary cataract, right eye: Secondary | ICD-10-CM | POA: Diagnosis not present

## 2022-06-29 DIAGNOSIS — E113393 Type 2 diabetes mellitus with moderate nonproliferative diabetic retinopathy without macular edema, bilateral: Secondary | ICD-10-CM | POA: Diagnosis not present

## 2022-06-30 ENCOUNTER — Encounter (HOSPITAL_COMMUNITY): Payer: Self-pay

## 2022-06-30 ENCOUNTER — Other Ambulatory Visit: Payer: Self-pay | Admitting: Internal Medicine

## 2022-06-30 ENCOUNTER — Emergency Department (HOSPITAL_COMMUNITY)
Admission: EM | Admit: 2022-06-30 | Discharge: 2022-06-30 | Disposition: A | Discharge status: Home / Self Care | Payer: HMO | Attending: Emergency Medicine | Admitting: Emergency Medicine

## 2022-06-30 ENCOUNTER — Ambulatory Visit (HOSPITAL_BASED_OUTPATIENT_CLINIC_OR_DEPARTMENT_OTHER): Payer: HMO | Admitting: Physician Assistant

## 2022-06-30 ENCOUNTER — Other Ambulatory Visit: Payer: Self-pay

## 2022-06-30 DIAGNOSIS — R040 Epistaxis: Secondary | ICD-10-CM | POA: Diagnosis not present

## 2022-06-30 DIAGNOSIS — Z7901 Long term (current) use of anticoagulants: Secondary | ICD-10-CM | POA: Insufficient documentation

## 2022-06-30 DIAGNOSIS — Z1231 Encounter for screening mammogram for malignant neoplasm of breast: Secondary | ICD-10-CM

## 2022-06-30 LAB — COMPREHENSIVE METABOLIC PANEL
ALT: 19 U/L (ref 0–44)
AST: 24 U/L (ref 15–41)
Albumin: 3.3 g/dL — ABNORMAL LOW (ref 3.5–5.0)
Alkaline Phosphatase: 100 U/L (ref 38–126)
Anion gap: 12 (ref 5–15)
BUN: 19 mg/dL (ref 8–23)
CO2: 26 mmol/L (ref 22–32)
Calcium: 9.6 mg/dL (ref 8.9–10.3)
Chloride: 97 mmol/L — ABNORMAL LOW (ref 98–111)
Creatinine, Ser: 1.26 mg/dL — ABNORMAL HIGH (ref 0.44–1.00)
GFR, Estimated: 45 mL/min — ABNORMAL LOW (ref >60)
Glucose, Bld: 182 mg/dL — ABNORMAL HIGH (ref 70–99)
Potassium: 3.9 mmol/L (ref 3.5–5.1)
Sodium: 135 mmol/L (ref 135–145)
Total Bilirubin: 0.4 mg/dL (ref 0.3–1.2)
Total Protein: 7.8 g/dL (ref 6.5–8.1)

## 2022-06-30 LAB — CBC WITH DIFFERENTIAL/PLATELET
Abs Immature Granulocytes: 0.04 10*3/uL (ref 0.00–0.07)
Basophils Absolute: 0.1 10*3/uL (ref 0.0–0.1)
Basophils Relative: 1 %
Eosinophils Absolute: 0.2 10*3/uL (ref 0.0–0.5)
Eosinophils Relative: 2 %
HCT: 37 % (ref 36.0–46.0)
Hemoglobin: 12.3 g/dL (ref 12.0–15.0)
Immature Granulocytes: 1 %
Lymphocytes Relative: 18 %
Lymphs Abs: 1.5 10*3/uL (ref 0.7–4.0)
MCH: 29.4 pg (ref 26.0–34.0)
MCHC: 33.2 g/dL (ref 30.0–36.0)
MCV: 88.3 fL (ref 80.0–100.0)
Monocytes Absolute: 0.7 10*3/uL (ref 0.1–1.0)
Monocytes Relative: 8 %
Neutro Abs: 5.9 10*3/uL (ref 1.7–7.7)
Neutrophils Relative %: 70 %
Platelets: 258 10*3/uL (ref 150–400)
RBC: 4.19 MIL/uL (ref 3.87–5.11)
RDW: 12.8 % (ref 11.5–15.5)
WBC: 8.3 10*3/uL (ref 4.0–10.5)
nRBC: 0 % (ref 0.0–0.2)

## 2022-06-30 LAB — PROTIME-INR
INR: 2.8 — ABNORMAL HIGH (ref 0.8–1.2)
Prothrombin Time: 29.1 seconds — ABNORMAL HIGH (ref 11.4–15.2)

## 2022-06-30 LAB — MAGNESIUM: Magnesium: 1.6 mg/dL — ABNORMAL LOW (ref 1.7–2.4)

## 2022-06-30 LAB — LIPASE, BLOOD: Lipase: 46 U/L (ref 11–51)

## 2022-06-30 MED ORDER — OXYMETAZOLINE HCL 0.05 % NA SOLN
1.0000 | Freq: Once | NASAL | Status: AC
  Administered 2022-06-30: 1 via NASAL
  Filled 2022-06-30: qty 30

## 2022-06-30 MED ORDER — MAGNESIUM OXIDE -MG SUPPLEMENT 400 (240 MG) MG PO TABS
400.0000 mg | ORAL_TABLET | Freq: Once | ORAL | Status: AC
  Administered 2022-06-30: 400 mg via ORAL
  Filled 2022-06-30: qty 1

## 2022-06-30 NOTE — ED Notes (Signed)
Patient unable to swallow mag pill so reordered so we can crush it and put it in applesauce.

## 2022-06-30 NOTE — ED Triage Notes (Signed)
Patient complains of nosebleed that started the past hour with mouth dryness and not feeling well. States symptoms resolved on assessment and has had some intermittent nausea with same. Alert and oriented

## 2022-06-30 NOTE — Discharge Instructions (Addendum)
Follow-up with your PCP. If you have recurrent continued nosebleeds, a referral to an ear nose and throat (ENT) specialist would be warranted.  If you have a recurrent bleed, take afrin and try holding pressure. Take Afrin no more than three 3 days in a row. Return for any uncontrolled bleeding.

## 2022-06-30 NOTE — ED Provider Notes (Signed)
[place] EMERGENCY DEPARTMENT Provider Note   CSN: [id number] Arrival date & time: 06/30/22  5631     History  Chief Complaint  Patient presents with   Epistaxis   Emesis    [name] is a 70 y.o. female.  HPI   71 year old female with medical history significant for atrial fibrillation on Coumadin who presents to the emergency department with a nosebleed.  She states that she swallowed some blood.  She endorses some dryness in her mouth and after the nosebleed endorse nausea.  Prior to the bleed, she was asymptomatic.  Currently, the bleed has stopped and she is again asymptomatic.  Denies any fevers, chills, cough, shortness of breath, muscle aches.  Home Medications Prior to Admission medications   Medication Sig Start Date End Date Taking? Authorizing Provider  Blood Glucose Monitoring Suppl ([place]) w/Device KIT  07/23/20   [provider]  Continuous Blood Gluc Receiver (FREESTYLE LIBRE 2 READER) [place] See admin instructions. 11/18/20   [provider]  furosemide (LASIX) 40 MG tablet Take 1 tablet (40 mg total) by mouth 3 (three) times a week. TAKE Monday Wednesday AND FRIDAY 08/05/21   Deberah Pelton, NP  insulin NPH-regular Human (70-30) 100 UNIT/ML injection Inject 20-62 Units into the skin 2 (two) times daily with a meal. 62 units in the morning and 20 units at bedtme    [provider]  Lancets (ONETOUCH DELICA PLUS [id number]) [place] Apply topically. 08/13/20   [provider]  lisinopril-hydrochlorothiazide (PRINZIDE,ZESTORETIC) 20-25 MG per tablet Take 1 tablet by mouth daily.    [provider]  metoCLOPramide (REGLAN) 10 MG tablet Take 10 mg by mouth 2 (two) times daily. Per patient    [provider]  metoprolol succinate (TOPROL-XL) 50 MG 24 hr tablet TAKE 1 TABLET BY MOUTH EVERY DAY 11/03/21   Croitoru, Mihai, MD  metoprolol tartrate (LOPRESSOR) 25 MG tablet Take 1  table as needed for heart rate above 120bpm. 07/17/21   Bhagat, Bhavinkumar, PA  ONETOUCH VERIO test strip 1 each 2 (two) times daily. 10/11/20   [provider]  pantoprazole (PROTONIX) 40 MG tablet Take 1 tablet (40 mg total) by mouth daily. 06/24/22   Croitoru, Mihai, MD  simvastatin (ZOCOR) 40 MG tablet TAKE 1 TABLET BY MOUTH DAILY AT 6 PM. 06/24/22   Croitoru, Mihai, MD  spironolactone (ALDACTONE) 25 MG tablet Take 1 tablet (25 mg total) by mouth 3 (three) times a week. TAKE Monday Wednesday AND Friday 08/05/21   Deberah Pelton, NP  Vitamin D, Cholecalciferol, 1000 units TABS Take 1,000 Units by mouth every morning.     [provider]  warfarin (COUMADIN) 7.5 MG tablet TAKE 1/2 TO 1 TABLET DAILY AS DIRECTED BY COUMADIN CLINIC 04/08/22   Croitoru, Mihai, MD      Allergies    Levofloxacin and Morphine and related    Review of Systems   Review of Systems  HENT:  Positive for nosebleeds.   All other systems reviewed and are negative.   Physical Exam Updated Vital Signs BP (!) 171/82 (BP Location: Right Arm)   Pulse (!) 103   Temp 98 F (36.7 C) (Oral)   Resp 17   SpO2 96%  Physical Exam Vitals and nursing note reviewed.  Constitutional:      General: She is not in acute distress.    Appearance: She is well-developed.  HENT:     Head: Normocephalic and atraumatic.  Comments: No active bleeding from the naris bilaterally    Nose:     Right Nostril: No epistaxis.     Left Nostril: No epistaxis.  Eyes:     Conjunctiva/sclera: Conjunctivae normal.  Cardiovascular:     Rate and Rhythm: Normal rate and regular rhythm.     Heart sounds: No murmur heard. Pulmonary:     Effort: Pulmonary effort is normal. No respiratory distress.     Breath sounds: Normal breath sounds.  Abdominal:     Palpations: Abdomen is soft.     Tenderness: There is no abdominal tenderness.  Musculoskeletal:        General: No swelling.     Cervical back: Neck supple.  Skin:     General: Skin is warm and dry.     Capillary Refill: Capillary refill takes less than 2 seconds.  Neurological:     Mental Status: She is alert.  Psychiatric:        Mood and Affect: Mood normal.     ED Results / Procedures / Treatments   Labs (all labs ordered are listed, but only abnormal results are displayed) Labs Reviewed  MAGNESIUM - Abnormal; Notable for the following components:      Result Value   Magnesium 1.6 (*)    All other components within normal limits  PROTIME-INR - Abnormal; Notable for the following components:   Prothrombin Time 29.1 (*)    INR 2.8 (*)    All other components within normal limits  COMPREHENSIVE METABOLIC PANEL - Abnormal; Notable for the following components:   Chloride 97 (*)    Glucose, Bld 182 (*)    Creatinine, Ser 1.26 (*)    Albumin 3.3 (*)    GFR, Estimated 45 (*)    All other components within normal limits  CBC WITH DIFFERENTIAL/PLATELET  LIPASE, BLOOD    EKG None  Radiology No results found.  Procedures Procedures    Medications Ordered in ED Medications  oxymetazoline (AFRIN) 0.05 % nasal spray 1 spray (has no administration in time range)  magnesium oxide (MAG-OX) tablet 400 mg (400 mg Oral Given 06/30/22 0913)    ED Course/ Medical Decision Making/ A&P                           Medical Decision Making Risk OTC drugs.     71 year old female with medical history significant for atrial fibrillation on Coumadin who presents to the emergency department with a nosebleed.  She states that she swallowed some blood.  She endorses some dryness in her mouth and after the nosebleed endorse nausea.  Prior to the bleed, she was asymptomatic.  Currently, the bleed has stopped and she is again asymptomatic.  Denies any fevers, chills, cough, shortness of breath, muscle aches.  On arrival, the patient was vitally stable. Physical exam revealed to active epistaxis. Has resolved since 7am this morning.  Screening labs  significant for INR which was therapeutic at 2.8, lipase normal, CMP with elevated serum creatinine to 1.26 but close to the patient's baseline between 1.2-1.4.  The patient had no leukocytosis, no anemia with a hemoglobin of 12.3.  Her magnesium was checked and found to be mildly hypomagnesemic to 1.6 which was replenished orally.  She was administered 2 sprays of Afrin in each nare and advised to follow-up outpatient.  Return precautions provided in the event of uncontrolled bleeding.  Patient is currently asymptomatic, had had some mild nausea after swallowing  blood but this has resolved.  Tolerating oral intake and able to rehydrate.  Plan for PCP follow-up, outpatient referral to ENT for recurrent episodes of bleeding. Stable for discharge.  Final Clinical Impression(s) / ED Diagnoses Final diagnoses:  Epistaxis  Hypomagnesemia    Rx / DC Orders ED Discharge Orders     None         Regan Lemming, MD 06/30/22 [phone]

## 2022-06-30 NOTE — ED Provider Triage Note (Signed)
  Emergency Medicine Provider Triage Evaluation Note  MRN:  [id number]  Arrival date & time: 06/30/22    Medically screening exam initiated at 6:34 AM.   CC:   Nausea, epistaxis  HPI:  [name] is a 71 y.o. year-old female presents to the ED with chief complaint of shaking, nose bleed, and nausea.  Onset this morning.  Nose is no longer bleeding.  Anticoagulated on Coumadin.  States that she feels shaky and cold.  History provided by patient. ROS:  -As included in HPI PE:   Vitals:   06/30/22 0632  BP: (!) 171/82  Pulse: (!) 103  Resp: 17  Temp: 98 F (36.7 C)  SpO2: 96%    Non-toxic appearing No respiratory distress No current bleeding Mild tremors in upper extremities MDM:   I've ordered labs in triage to expedite lab/diagnostic workup.  Patient was informed that the remainder of the evaluation will be completed by another provider, this initial triage assessment does not replace that evaluation, and the importance of remaining in the ED until their evaluation is complete.    Montine Circle, PA-C 06/30/22 [phone]

## 2022-07-02 DIAGNOSIS — N1832 Chronic kidney disease, stage 3b: Secondary | ICD-10-CM | POA: Diagnosis not present

## 2022-07-02 DIAGNOSIS — E1122 Type 2 diabetes mellitus with diabetic chronic kidney disease: Secondary | ICD-10-CM | POA: Diagnosis not present

## 2022-07-02 DIAGNOSIS — Z794 Long term (current) use of insulin: Secondary | ICD-10-CM | POA: Diagnosis not present

## 2022-07-06 ENCOUNTER — Ambulatory Visit
Admission: RE | Admit: 2022-07-06 | Discharge: 2022-07-06 | Disposition: A | Discharge status: Home / Self Care | Payer: HMO | Source: Ambulatory Visit | Attending: Emergency Medicine | Admitting: Emergency Medicine

## 2022-07-06 DIAGNOSIS — Z85110 Personal history of malignant carcinoid tumor of bronchus and lung: Secondary | ICD-10-CM | POA: Diagnosis not present

## 2022-07-06 DIAGNOSIS — R911 Solitary pulmonary nodule: Secondary | ICD-10-CM | POA: Diagnosis not present

## 2022-07-06 DIAGNOSIS — I700 Atherosclerosis of aorta: Secondary | ICD-10-CM | POA: Diagnosis not present

## 2022-07-07 ENCOUNTER — Encounter (HOSPITAL_BASED_OUTPATIENT_CLINIC_OR_DEPARTMENT_OTHER): Payer: HMO | Attending: Physician Assistant | Admitting: Physician Assistant

## 2022-07-07 DIAGNOSIS — Z7901 Long term (current) use of anticoagulants: Secondary | ICD-10-CM | POA: Insufficient documentation

## 2022-07-07 DIAGNOSIS — I890 Lymphedema, not elsewhere classified: Secondary | ICD-10-CM | POA: Insufficient documentation

## 2022-07-07 DIAGNOSIS — E11622 Type 2 diabetes mellitus with other skin ulcer: Secondary | ICD-10-CM | POA: Diagnosis not present

## 2022-07-07 DIAGNOSIS — I7389 Other specified peripheral vascular diseases: Secondary | ICD-10-CM | POA: Insufficient documentation

## 2022-07-07 DIAGNOSIS — L97222 Non-pressure chronic ulcer of left calf with fat layer exposed: Secondary | ICD-10-CM | POA: Diagnosis not present

## 2022-07-07 DIAGNOSIS — L97812 Non-pressure chronic ulcer of other part of right lower leg with fat layer exposed: Secondary | ICD-10-CM | POA: Diagnosis not present

## 2022-07-07 DIAGNOSIS — I87331 Chronic venous hypertension (idiopathic) with ulcer and inflammation of right lower extremity: Secondary | ICD-10-CM | POA: Diagnosis present

## 2022-07-07 DIAGNOSIS — L97212 Non-pressure chronic ulcer of right calf with fat layer exposed: Secondary | ICD-10-CM | POA: Diagnosis not present

## 2022-07-07 DIAGNOSIS — I10 Essential (primary) hypertension: Secondary | ICD-10-CM | POA: Diagnosis not present

## 2022-07-07 NOTE — Progress Notes (Signed)
Patient Management []  - 0 Hearing / Language / Visual special needs []  - 0 Assessment of Community assistance (transportation, D/C planning, etc.) []  - 0 Additional assistance / Altered mentation []  - 0 Support Surface(s) Assessment (bed, cushion, seat, etc.) INTERVENTIONS - Wound Cleansing / Measurement []  - 0 Simple Wound Cleansing - one wound X- 2 5 Complex Wound Cleansing - multiple wounds X- 1 5 Wound Imaging (photographs - any number of wounds) []  - 0 Wound Tracing (instead of photographs) []  - 0 Simple Wound Measurement - one wound X- 2 5 Complex Wound Measurement - multiple wounds INTERVENTIONS - Wound Dressings []  - 0 Small Wound Dressing one or multiple wounds []  - 0 Medium Wound Dressing one or multiple wounds []  - 0 Large Wound Dressing one or multiple wounds []  - 0 Application of Medications - topical []  - 0 Application of Medications - injection INTERVENTIONS - Miscellaneous []  - 0 External ear exam []  - 0 Specimen Collection (cultures, biopsies, blood, body fluids, etc.) []  - 0 Specimen(s) / Culture(s) sent or taken to Lab for analysis []  - 0 Patient Transfer (multiple staff / Harrel Lemon Lift / Similar devices) []  - 0 Simple Staple / Suture removal (25 or less) [name] ([id number]) [id number] Page 3 of 8 []  - 0 Complex Staple / Suture removal (26 or more) []  - 0 Hypo / Hyperglycemic Management (close monitor of Blood Glucose) []  - 0 Ankle / Brachial Index (ABI) - do not check if billed separately X- 1 5 Vital Signs Has the patient been seen at the hospital within the last three years: Yes Total Score: 110 Level Of Care: New/Established - Level 3 Electronic Signature(s) Signed: 07/07/2022 6:11:16  PM By: Deon Pilling RN, BSN Entered By: Deon Pilling on 07/07/2022 14:34:00 -------------------------------------------------------------------------------- Encounter Discharge Information Details Patient Name: Date of Service: [name] [name]. 07/07/2022 1:30 PM Medical Record Number: [id number] Patient Account Number: [id number] Date of Birth/Sex: Treating RN: 11/23/1950 (71 y.o. [name] Primary Care Veneda Kirksey: [name] Other Clinician: Referring Lurae Hornbrook: Treating Shaylynne Lunt/Extender: Angelica Pou in Treatment: 4 Encounter Discharge Information Items Discharge Condition: Stable Ambulatory Status: Wheelchair Discharge Destination: Home Transportation: Private Auto Schedule Follow-up Appointment: Yes Clinical Summary of Care: Notes applied juxtalite HD to both legs. Electronic Signature(s) Signed: 07/07/2022 6:11:16 PM By: Deon Pilling RN, BSN Entered By: Deon Pilling on 07/07/2022 14:34:39 -------------------------------------------------------------------------------- Lower Extremity Assessment Details Patient Name: Date of Service: [name] [name]. 07/07/2022 1:30 PM Medical Record Number: [id number] Patient Account Number: [id number] Date of Birth/Sex: Treating RN: 02-11-1951 (71 y.o. [name] Primary Care Emika Tiano: [name] Other Clinician: Referring Jaretzi Droz: Treating Imelda Dandridge/Extender: [name], [name] in Treatment: 4 Edema Assessment Assessed: [Left: Yes] [Right: No] Edema: [Left: Yes] [Right: Yes] Calf Left: Right: Point of Measurement: 34.4 cm From Medial Instep 56 cm 52 cm [name] [name] ([id number]) [id number] Page 4 of 8 Ankle Left: Right: Point of Measurement: 10 cm From Medial Instep 29.5 cm 24 cm Vascular Assessment Pulses: Dorsalis Pedis Palpable: [Left:Yes] [Right:Yes] Electronic Signature(s) Signed: 07/07/2022  6:11:16 PM By: Deon Pilling RN, BSN Entered By: Deon Pilling on 07/07/2022 13:53:54 -------------------------------------------------------------------------------- [place] Details Patient Name: Date of Service: [name] [name]. 07/07/2022 1:30 PM Medical Record Number: [id number] Patient Account Number: [id number] Date of Birth/Sex: Treating RN: 07/26/1951 (71 y.o. [name] Primary Care Tanielle Emigh: [name] Other Clinician: Referring Georgeanna Radziewicz: Treating [name]/Extender: [name], [name] in  Deon Pilling on 07/07/2022 13:57:54 -------------------------------------------------------------------------------- Wound Assessment Details Patient Name: Date of Service: [name]. 07/07/2022 1:30 PM Medical Record Number: [id number] Patient Account Number: [id number] Date of Birth/Sex: Treating RN: [birth year] (71 y.o. [name], [name] Primary Care Honest Vanleer: [name] Other Clinician: Referring Nickolai Rinks: Treating [name]/Extender: [name], [name] in Treatment: 4 Wound Status Wound Number: 4  Primary Lymphedema Etiology: Wound Location: Left, Posterior Lower Leg Secondary Diabetic Wound/Ulcer of the Lower Extremity Wounding Event: Gradually Appeared Etiology: Date Acquired: 06/16/2022 Wound Open Weeks Of Treatment: 2 Status: Clustered Wound: No Comorbid Cataracts, Sleep Apnea, Arrhythmia, Coronary Artery Disease, History: Hypertension, Peripheral Venous Disease, Type II Diabetes, Osteoarthritis, Neuropathy Photos Wound Measurements Length: (cm) Width: (cm) Depth: (cm) Area: (cm) Volume: (cm) 0 % Reduction in Area: 100% 0 % Reduction in Volume: 100% 0 Epithelialization: Large (67-100%) 0 Tunneling: No 0 Undermining: No Wound Description Classification: Full Thickness Without Exposed Support Structures Wound Margin: Distinct, outline attached Exudate Amount: None Present Foul Odor After Cleansing: No Slough/Fibrino No Wound Bed Granulation Amount: None Present (0%) Exposed Structure Necrotic Amount: None Present (0%) Fascia Exposed: No Fat Layer (Subcutaneous Tissue) Exposed: No Tendon Exposed: No Muscle Exposed: No Joint Exposed: No Bone Exposed: No Periwound Skin Texture Texture Color No Abnormalities Noted: No No Abnormalities Noted: No LORIN, HAUCK [name] ([id number]) [id number] Page 8 of 8 Moisture No Abnormalities Noted: No Electronic Signature(s) Signed: 07/07/2022 6:11:16 PM By: Deon Pilling RN, BSN Entered By: Deon Pilling on 07/07/2022 13:58:20 -------------------------------------------------------------------------------- Vitals Details Patient Name: Date of Service: Jeanie Cooks, Vickii Chafe [name]. 07/07/2022 1:30 PM Medical Record Number: [id number] Patient Account Number: [id number] Date of Birth/Sex: Treating RN: 27-Oct-1950 (71 y.o. [name], Tammi Klippel Primary Care Lennix Kneisel: [name] Other Clinician: Referring Nakota Elsen: Treating Candis Kabel/Extender: [name], [name] in Treatment:  4 Vital Signs Time Taken: 13:45 Temperature (F): 98.2 Height (in): 63 Pulse (bpm): 97 Weight (lbs): 284 Respiratory Rate (breaths/min): 20 Body Mass Index (BMI): 50.3 Blood Pressure (mmHg): 152/84 Capillary Blood Glucose (mg/dl): 182 Reference Range: 80 - 120 mg / dl Electronic Signature(s) Signed: 07/07/2022 6:11:16 PM By: Deon Pilling RN, BSN Entered By: Deon Pilling on 07/07/2022 13:47:39  Patient Management []  - 0 Hearing / Language / Visual special needs []  - 0 Assessment of Community assistance (transportation, D/C planning, etc.) []  - 0 Additional assistance / Altered mentation []  - 0 Support Surface(s) Assessment (bed, cushion, seat, etc.) INTERVENTIONS - Wound Cleansing / Measurement []  - 0 Simple Wound Cleansing - one wound X- 2 5 Complex Wound Cleansing - multiple wounds X- 1 5 Wound Imaging (photographs - any number of wounds) []  - 0 Wound Tracing (instead of photographs) []  - 0 Simple Wound Measurement - one wound X- 2 5 Complex Wound Measurement - multiple wounds INTERVENTIONS - Wound Dressings []  - 0 Small Wound Dressing one or multiple wounds []  - 0 Medium Wound Dressing one or multiple wounds []  - 0 Large Wound Dressing one or multiple wounds []  - 0 Application of Medications - topical []  - 0 Application of Medications - injection INTERVENTIONS - Miscellaneous []  - 0 External ear exam []  - 0 Specimen Collection (cultures, biopsies, blood, body fluids, etc.) []  - 0 Specimen(s) / Culture(s) sent or taken to Lab for analysis []  - 0 Patient Transfer (multiple staff / Harrel Lemon Lift / Similar devices) []  - 0 Simple Staple / Suture removal (25 or less) [name] ([id number]) [id number] Page 3 of 8 []  - 0 Complex Staple / Suture removal (26 or more) []  - 0 Hypo / Hyperglycemic Management (close monitor of Blood Glucose) []  - 0 Ankle / Brachial Index (ABI) - do not check if billed separately X- 1 5 Vital Signs Has the patient been seen at the hospital within the last three years: Yes Total Score: 110 Level Of Care: New/Established - Level 3 Electronic Signature(s) Signed: 07/07/2022 6:11:16  PM By: Deon Pilling RN, BSN Entered By: Deon Pilling on 07/07/2022 14:34:00 -------------------------------------------------------------------------------- Encounter Discharge Information Details Patient Name: Date of Service: [name] [name]. 07/07/2022 1:30 PM Medical Record Number: [id number] Patient Account Number: [id number] Date of Birth/Sex: Treating RN: 11/23/1950 (71 y.o. [name] Primary Care Veneda Kirksey: [name] Other Clinician: Referring Lurae Hornbrook: Treating Shaylynne Lunt/Extender: Angelica Pou in Treatment: 4 Encounter Discharge Information Items Discharge Condition: Stable Ambulatory Status: Wheelchair Discharge Destination: Home Transportation: Private Auto Schedule Follow-up Appointment: Yes Clinical Summary of Care: Notes applied juxtalite HD to both legs. Electronic Signature(s) Signed: 07/07/2022 6:11:16 PM By: Deon Pilling RN, BSN Entered By: Deon Pilling on 07/07/2022 14:34:39 -------------------------------------------------------------------------------- Lower Extremity Assessment Details Patient Name: Date of Service: [name] [name]. 07/07/2022 1:30 PM Medical Record Number: [id number] Patient Account Number: [id number] Date of Birth/Sex: Treating RN: 02-11-1951 (71 y.o. [name] Primary Care Emika Tiano: [name] Other Clinician: Referring Jaretzi Droz: Treating Imelda Dandridge/Extender: [name], [name] in Treatment: 4 Edema Assessment Assessed: [Left: Yes] [Right: No] Edema: [Left: Yes] [Right: Yes] Calf Left: Right: Point of Measurement: 34.4 cm From Medial Instep 56 cm 52 cm [name] [name] ([id number]) [id number] Page 4 of 8 Ankle Left: Right: Point of Measurement: 10 cm From Medial Instep 29.5 cm 24 cm Vascular Assessment Pulses: Dorsalis Pedis Palpable: [Left:Yes] [Right:Yes] Electronic Signature(s) Signed: 07/07/2022  6:11:16 PM By: Deon Pilling RN, BSN Entered By: Deon Pilling on 07/07/2022 13:53:54 -------------------------------------------------------------------------------- [place] Details Patient Name: Date of Service: [name] [name]. 07/07/2022 1:30 PM Medical Record Number: [id number] Patient Account Number: [id number] Date of Birth/Sex: Treating RN: 07/26/1951 (71 y.o. [name] Primary Care Tanielle Emigh: [name] Other Clinician: Referring Georgeanna Radziewicz: Treating [name]/Extender: [name], [name] in  Patient Management []  - 0 Hearing / Language / Visual special needs []  - 0 Assessment of Community assistance (transportation, D/C planning, etc.) []  - 0 Additional assistance / Altered mentation []  - 0 Support Surface(s) Assessment (bed, cushion, seat, etc.) INTERVENTIONS - Wound Cleansing / Measurement []  - 0 Simple Wound Cleansing - one wound X- 2 5 Complex Wound Cleansing - multiple wounds X- 1 5 Wound Imaging (photographs - any number of wounds) []  - 0 Wound Tracing (instead of photographs) []  - 0 Simple Wound Measurement - one wound X- 2 5 Complex Wound Measurement - multiple wounds INTERVENTIONS - Wound Dressings []  - 0 Small Wound Dressing one or multiple wounds []  - 0 Medium Wound Dressing one or multiple wounds []  - 0 Large Wound Dressing one or multiple wounds []  - 0 Application of Medications - topical []  - 0 Application of Medications - injection INTERVENTIONS - Miscellaneous []  - 0 External ear exam []  - 0 Specimen Collection (cultures, biopsies, blood, body fluids, etc.) []  - 0 Specimen(s) / Culture(s) sent or taken to Lab for analysis []  - 0 Patient Transfer (multiple staff / Harrel Lemon Lift / Similar devices) []  - 0 Simple Staple / Suture removal (25 or less) [name] ([id number]) [id number] Page 3 of 8 []  - 0 Complex Staple / Suture removal (26 or more) []  - 0 Hypo / Hyperglycemic Management (close monitor of Blood Glucose) []  - 0 Ankle / Brachial Index (ABI) - do not check if billed separately X- 1 5 Vital Signs Has the patient been seen at the hospital within the last three years: Yes Total Score: 110 Level Of Care: New/Established - Level 3 Electronic Signature(s) Signed: 07/07/2022 6:11:16  PM By: Deon Pilling RN, BSN Entered By: Deon Pilling on 07/07/2022 14:34:00 -------------------------------------------------------------------------------- Encounter Discharge Information Details Patient Name: Date of Service: [name] [name]. 07/07/2022 1:30 PM Medical Record Number: [id number] Patient Account Number: [id number] Date of Birth/Sex: Treating RN: 11/23/1950 (71 y.o. [name] Primary Care Veneda Kirksey: [name] Other Clinician: Referring Lurae Hornbrook: Treating Shaylynne Lunt/Extender: Angelica Pou in Treatment: 4 Encounter Discharge Information Items Discharge Condition: Stable Ambulatory Status: Wheelchair Discharge Destination: Home Transportation: Private Auto Schedule Follow-up Appointment: Yes Clinical Summary of Care: Notes applied juxtalite HD to both legs. Electronic Signature(s) Signed: 07/07/2022 6:11:16 PM By: Deon Pilling RN, BSN Entered By: Deon Pilling on 07/07/2022 14:34:39 -------------------------------------------------------------------------------- Lower Extremity Assessment Details Patient Name: Date of Service: [name] [name]. 07/07/2022 1:30 PM Medical Record Number: [id number] Patient Account Number: [id number] Date of Birth/Sex: Treating RN: 02-11-1951 (71 y.o. [name] Primary Care Emika Tiano: [name] Other Clinician: Referring Jaretzi Droz: Treating Imelda Dandridge/Extender: [name], [name] in Treatment: 4 Edema Assessment Assessed: [Left: Yes] [Right: No] Edema: [Left: Yes] [Right: Yes] Calf Left: Right: Point of Measurement: 34.4 cm From Medial Instep 56 cm 52 cm [name] [name] ([id number]) [id number] Page 4 of 8 Ankle Left: Right: Point of Measurement: 10 cm From Medial Instep 29.5 cm 24 cm Vascular Assessment Pulses: Dorsalis Pedis Palpable: [Left:Yes] [Right:Yes] Electronic Signature(s) Signed: 07/07/2022  6:11:16 PM By: Deon Pilling RN, BSN Entered By: Deon Pilling on 07/07/2022 13:53:54 -------------------------------------------------------------------------------- [place] Details Patient Name: Date of Service: [name] [name]. 07/07/2022 1:30 PM Medical Record Number: [id number] Patient Account Number: [id number] Date of Birth/Sex: Treating RN: 07/26/1951 (71 y.o. [name] Primary Care Tanielle Emigh: [name] Other Clinician: Referring Georgeanna Radziewicz: Treating [name]/Extender: [name], [name] in

## 2022-07-07 NOTE — Progress Notes (Signed)
DOMINGUE, COLTRAIN A ([id number]) [id number] Page 1 of 8 Visit Report for 07/07/2022 Chief Complaint Document Details Patient Name: Date of Service: [name], [name] 07/07/2022 1:30 PM Medical Record Number: [id number] Patient Account Number: [id number] Date of Birth/Sex: Treating RN: April 09, 1951 (71 y.o. F) Primary Care Provider: [name] Other Clinician: Referring Provider: Treating Provider/Extender: [name], [name] in Treatment: 4 Information Obtained from: Patient Chief Complaint Right LE Ulcers Electronic Signature(s) Signed: 07/07/2022 2:02:16 PM By: Worthy Keeler PA-C Entered By: Worthy Keeler on 07/07/2022 14:02:15 -------------------------------------------------------------------------------- HPI Details Patient Name: Date of Service: Brandi Morgans A. 07/07/2022 1:30 PM Medical Record Number: [id number] Patient Account Number: [id number] Date of Birth/Sex: Treating RN: 08-08-1951 (71 y.o. F) Primary Care Provider: [name] Other Clinician: Referring Provider: Treating Provider/Extender: [name], [name] in Treatment: 4 History of Present Illness HPI Description: 08/05/2021 upon evaluation today patient has bilateral wounds on her lower extremities. She has been tolerating the dressing changes without complication at home but unfortunately is not really seeing signs of improvement like she would like to see. She does have what appears to be bilateral lymphedema due to chronic venous insufficiency most likely. She is on fluid pills and takes them regularly. She really does not sit with her legs elevated much she has a recliner but again does not use that often. I think she should be using this more to get her feet up. Subsequently she tells me this has been going on for several weeks to even months. She finally decided to make a referral herself to come in to be  seen. Patient does have a history of diabetes mellitus type 2, some evidence of potential peripheral vascular disease as she is noncompressible with her ABIs in the office today, hypertension, and she is on long-term anticoagulant therapy. 08/12/2021 upon evaluation today patient's wounds appear to be doing well in regard to her legs currently. Actually very pleased with where things stand and I do not see any signs of active infection which is great news. No fevers, chills, nausea, vomiting, or diarrhea. 08/19/2021 upon evaluation today patient appears to be doing well with regard to her legs. Everything actually appears to be healing quite nicely with doing extremely well. Fortunately there does not appear to be any signs of active infection which is great news. Readmission: 06-09-2022 upon evaluation today patient appears for reevaluation in the clinic concerning issues that she has been having with her leg. This is actually her right leg at this time though when I saw her previously was both legs. Fortunately there does not appear to be any evidence of active infection at this time which is great news. No fevers, chills, nausea, vomiting, or diarrhea. Her past medical history really has not changed significantly since last time I saw her. I did discuss with her today how she has been doing with wearing her compression socks she tells me she really has not been wearing those because "she cannot get them on her son is not able to help. She tells me that he works at night and by the time he comes and goes to bed she is not up yet and then she wakes up later in the past do not cross. I talked her today about actually having a time later in the day when he could help with getting the socks on and that is a possibility. With that being said she currently states that she had not thought through  that completely I am not sure if that something she has been worked out or not. 06-16-2022 upon evaluation today  patient appears to be doing well currently in regard to her wounds all things considered. I am seeing excellent signs of improvement I think were getting very close to complete resolution which is great news and overall I do not see anything that seems to be worsening significantly also great news. In general I think we are on the right track here. She does have a follow-up with vein and vascular next Friday. 06-23-2022 upon evaluation today the wound on patient's right leg actually appears to be healed unfortunately she has an area on the left leg which is still open. [name], Brandi A ([id number]) [id number] Page 2 of 8 I am thinking that this may be brand-new skin on the right leg so I think we probably should still continue to wrap her for at least 1 more week and I discussed that with her today. Otherwise I think that we are on the right track for the most part. 07-07-2022 upon evaluation today patient appears to be doing well currently in regard to her wound she actually appears to be healed which is good news. Fortunately I do not see any signs of infection. The downside is that she actually did have her arterial studies that showed that on 06-25-2022 that her TBI on the right was 0.30 and on the left was 0.25. This is obviously very low and I think that that is going to continue to be an ongoing issue and something has to be monitored for her. I discussed that with the patient today. I did review Dr. Unk Lightning note had vascular surgery as well and he noted that to be honest right now being that she is not a great surgical candidate he would not want to proceed with any further intervention though she does not feel her stay healed then the neck step would be to do endovascular intervention. Electronic Signature(s) Signed: 07/07/2022 2:35:22 PM By: Worthy Keeler PA-C Entered By: Worthy Keeler on 07/07/2022  14:35:22 -------------------------------------------------------------------------------- Physical Exam Details Patient Name: Date of Service: [name], [name] 07/07/2022 1:30 PM Medical Record Number: [id number] Patient Account Number: [id number] Date of Birth/Sex: Treating RN: [birth year] (71 y.o. F) Primary Care Provider: [name] Other Clinician: Referring Provider: Treating Provider/Extender: [name], [name] in Treatment: 4 Constitutional Obese and well-hydrated in no acute distress. Respiratory normal breathing without difficulty. Psychiatric this patient is able to make decisions and demonstrates good insight into disease process. Alert and Oriented x 3. pleasant and cooperative. Notes Upon inspection patient's wound bed actually showed signs of good granulation and epithelization at this point. Fortunately I do not see any signs of infection locally or systemically which is great news and overall I am very pleased in fact she appears to be completely healed based on what I am seeing. Electronic Signature(s) Signed: 07/07/2022 2:35:39 PM By: Worthy Keeler PA-C Entered By: Worthy Keeler on 07/07/2022 14:35:39 -------------------------------------------------------------------------------- Physician Orders Details Patient Name: Date of Service: Brandi Morgans A. 07/07/2022 1:30 PM Medical Record Number: [id number] Patient Account Number: [id number] Date of Birth/Sex: Treating RN: Jan 30, 1951 (71 y.o. [name] Primary Care Provider: [name] Other Clinician: Referring Provider: Treating Provider/Extender: Angelica Pou in Treatment: 4 Verbal / Phone Orders: No Diagnosis Coding ICD-10 Coding Code Description I89.0 Lymphedema, not elsewhere classified I87.331 Chronic venous hypertension (idiopathic) with ulcer and  inflammation of right lower extremity L97.812 Non-pressure chronic  ulcer of other part of right lower leg with fat layer exposed E11.622 Type 2 diabetes mellitus with other skin ulcer [name], Brandi A ([id number]) [id number] Page 3 of 8 I73.89 Other specified peripheral vascular diseases I10 Essential (primary) hypertension Z79.01 Long term (current) use of anticoagulants Follow-up Appointments ppointment in 1 week. Jeri Cos, PA and [place], Room 8 Wednesday Return A Bringing back to ensure all the wounds remain closed and the juxtalite HD compression garments are helping the swelling in the legs. Edema Control - Lymphedema / SCD / Other Elevate legs to the level of the heart or above for 30 minutes daily and/or when sitting, a frequency of: - 3-4 times a day throughout the day ensure to elevate legs. Avoid standing for long periods of time. Exercise regularly Moisturize legs daily. - every night before bed. Compression stocking or Garment 20-30 mm/Hg pressure to: - apply in the morning and remove at night. Electronic Signature(s) Signed: 07/07/2022 5:12:24 PM By: Worthy Keeler PA-C Signed: 07/07/2022 6:11:16 PM By: Deon Pilling RN, BSN Entered By: Deon Pilling on 07/07/2022 14:33:27 -------------------------------------------------------------------------------- Problem List Details Patient Name: Date of Service: Brandi Morgans A. 07/07/2022 1:30 PM Medical Record Number: [id number] Patient Account Number: [id number] Date of Birth/Sex: Treating RN: [birth year] (71 y.o. F) Primary Care Provider: [name] Other Clinician: Referring Provider: Treating Provider/Extender: [name], [name] in Treatment: 4 Active Problems ICD-10 Encounter Code Description Active Date MDM Diagnosis I89.0 Lymphedema, not elsewhere classified 06/09/2022 No Yes I87.331 Chronic venous hypertension (idiopathic) with ulcer and inflammation of right 06/09/2022 No Yes lower extremity L97.812  Non-pressure chronic ulcer of other part of right lower leg with fat layer 06/09/2022 No Yes exposed E11.622 Type 2 diabetes mellitus with other skin ulcer 06/09/2022 No Yes I73.89 Other specified peripheral vascular diseases 06/09/2022 No Yes I10 Essential (primary) hypertension 06/09/2022 No Yes Z79.01 Long term (current) use of anticoagulants 06/09/2022 No Yes Inactive Problems [name], Brandi A ([id number]) [id number] Page 4 of 8 Resolved Problems Electronic Signature(s) Signed: 07/07/2022 2:02:07 PM By: Worthy Keeler PA-C Entered By: Worthy Keeler on 07/07/2022 14:02:07 -------------------------------------------------------------------------------- Progress Note Details Patient Name: Date of Service: Brandi Morgans A. 07/07/2022 1:30 PM Medical Record Number: [id number] Patient Account Number: [id number] Date of Birth/Sex: Treating RN: Aug 03, 1951 (71 y.o. F) Primary Care Provider: [name] Other Clinician: Referring Provider: Treating Provider/Extender: Angelica Pou in Treatment: 4 Subjective Chief Complaint Information obtained from Patient Right LE Ulcers History of Present Illness (HPI) 08/05/2021 upon evaluation today patient has bilateral wounds on her lower extremities. She has been tolerating the dressing changes without complication at home but unfortunately is not really seeing signs of improvement like she would like to see. She does have what appears to be bilateral lymphedema due to chronic venous insufficiency most likely. She is on fluid pills and takes them regularly. She really does not sit with her legs elevated much she has a recliner but again does not use that often. I think she should be using this more to get her feet up. Subsequently she tells me this has been going on for several weeks to even months. She finally decided to make a referral herself to come in to be seen. Patient does  have a history of diabetes mellitus type 2, some evidence of potential peripheral vascular disease as she is noncompressible with her ABIs in the office today, hypertension, and she  is on long-term anticoagulant therapy. 08/12/2021 upon evaluation today patient's wounds appear to be doing well in regard to her legs currently. Actually very pleased with where things stand and I do not see any signs of active infection which is great news. No fevers, chills, nausea, vomiting, or diarrhea. 08/19/2021 upon evaluation today patient appears to be doing well with regard to her legs. Everything actually appears to be healing quite nicely with doing extremely well. Fortunately there does not appear to be any signs of active infection which is great news. Readmission: 06-09-2022 upon evaluation today patient appears for reevaluation in the clinic concerning issues that she has been having with her leg. This is actually her right leg at this time though when I saw her previously was both legs. Fortunately there does not appear to be any evidence of active infection at this time which is great news. No fevers, chills, nausea, vomiting, or diarrhea. Her past medical history really has not changed significantly since last time I saw her. I did discuss with her today how she has been doing with wearing her compression socks she tells me she really has not been wearing those because "she cannot get them on her son is not able to help. She tells me that he works at night and by the time he comes and goes to bed she is not up yet and then she wakes up later in the past do not cross. I talked her today about actually having a time later in the day when he could help with getting the socks on and that is a possibility. With that being said she currently states that she had not thought through that completely I am not sure if that something she has been worked out or not. 06-16-2022 upon evaluation today patient appears to  be doing well currently in regard to her wounds all things considered. I am seeing excellent signs of improvement I think were getting very close to complete resolution which is great news and overall I do not see anything that seems to be worsening significantly also great news. In general I think we are on the right track here. She does have a follow-up with vein and vascular next Friday. 06-23-2022 upon evaluation today the wound on patient's right leg actually appears to be healed unfortunately she has an area on the left leg which is still open. I am thinking that this may be brand-new skin on the right leg so I think we probably should still continue to wrap her for at least 1 more week and I discussed that with her today. Otherwise I think that we are on the right track for the most part. 07-07-2022 upon evaluation today patient appears to be doing well currently in regard to her wound she actually appears to be healed which is good news. Fortunately I do not see any signs of infection. The downside is that she actually did have her arterial studies that showed that on 06-25-2022 that her TBI on the right was 0.30 and on the left was 0.25. This is obviously very low and I think that that is going to continue to be an ongoing issue and something has to be monitored for her. I discussed that with the patient today. I did review Dr. Unk Lightning note had vascular surgery as well and he noted that to be honest right now being that she is not a great surgical candidate he would not want to proceed with any further intervention though  she does not feel her stay healed then the neck step would be to do endovascular intervention. Patient History Information obtained from Patient. Family History Cancer - Siblings, Diabetes - Siblings, Heart Disease - Mother,Father, Hypertension - Father,Mother, No family history of Hereditary Spherocytosis, Kidney Disease, Lung Disease, Seizures, Stroke, Thyroid Problems,  Tuberculosis. Social History Former smoker - Quit 1986, Marital Status - Single, Alcohol Use - Never, Drug Use - No History, Caffeine Use - Moderate. Medical History Eyes [name], Brandi A ([id number]) [id number] Page 5 of 8 Patient has history of Cataracts - Removed Respiratory Patient has history of Sleep Apnea - no CPAP or BIPAP Cardiovascular Patient has history of Arrhythmia - Ballardfib, Coronary Artery Disease, Hypertension, Peripheral Venous Disease Denies history of Congestive Heart Failure Endocrine Patient has history of Type II Diabetes Musculoskeletal Patient has history of Osteoarthritis Neurologic Patient has history of Neuropathy Hospitalization/Surgery History - 0/20/2022 cardioverison. - 06/30/2022 low magnesium. Medical A Surgical History Notes nd Respiratory Lung Cancer-surgery 2010 Oncologic Lung Cancer, no radiation or chemo Objective Constitutional Obese and well-hydrated in no acute distress. Vitals Time Taken: 1:45 PM, Height: 63 in, Weight: 284 lbs, BMI: 50.3, Temperature: 98.2 F, Pulse: 97 bpm, Respiratory Rate: 20 breaths/min, Blood Pressure: 152/84 mmHg, Capillary Blood Glucose: 182 mg/dl. Respiratory normal breathing without difficulty. Psychiatric this patient is able to make decisions and demonstrates good insight into disease process. Alert and Oriented x 3. pleasant and cooperative. General Notes: Upon inspection patient's wound bed actually showed signs of good granulation and epithelization at this point. Fortunately I do not see any signs of infection locally or systemically which is great news and overall I am very pleased in fact she appears to be completely healed based on what I am seeing. Integumentary (Hair, Skin) Wound #3 status is Open. Original cause of wound was Blister. The date acquired was: 05/26/2022. The wound has been in treatment 4 weeks. The wound is located on the Right,Posterior Lower Leg. The  wound measures 0cm length x 0cm width x 0cm depth; 0cm^2 area and 0cm^3 volume. There is no tunneling or undermining noted. There is a none present amount of drainage noted. The wound margin is distinct with the outline attached to the wound base. There is no granulation within the wound bed. There is no necrotic tissue within the wound bed. The periwound skin appearance did not exhibit: Callus, Crepitus, Excoriation, Induration, Rash, Scarring, Dry/Scaly, Maceration, Atrophie Blanche, Cyanosis, Ecchymosis, Hemosiderin Staining, Mottled, Pallor, Rubor, Erythema. Wound #4 status is Open. Original cause of wound was Gradually Appeared. The date acquired was: 06/16/2022. The wound has been in treatment 2 weeks. The wound is located on the Left,Posterior Lower Leg. The wound measures 0cm length x 0cm width x 0cm depth; 0cm^2 area and 0cm^3 volume. There is no tunneling or undermining noted. There is a none present amount of drainage noted. The wound margin is distinct with the outline attached to the wound base. There is no granulation within the wound bed. There is no necrotic tissue within the wound bed. Assessment Active Problems ICD-10 Lymphedema, not elsewhere classified Chronic venous hypertension (idiopathic) with ulcer and inflammation of right lower extremity Non-pressure chronic ulcer of other part of right lower leg with fat layer exposed Type 2 diabetes mellitus with other skin ulcer Other specified peripheral vascular diseases Essential (primary) hypertension Long term (current) use of anticoagulants Plan Follow-up Appointments: Return Appointment in 1 week. Jeri Cos, PA and [place], Room 8 Wednesday Bringing back to ensure all the wounds remain  closed and the juxtalite HD [name], Brandi A ([id number]) [id number] Page 6 of 8 compression garments are helping the swelling in the legs. Edema Control - Lymphedema / SCD / Other: Elevate legs to the level of  the heart or above for 30 minutes daily and/or when sitting, a frequency of: - 3-4 times a day throughout the day ensure to elevate legs. Avoid standing for long periods of time. Exercise regularly Moisturize legs daily. - every night before bed. Compression stocking or Garment 20-30 mm/Hg pressure to: - apply in the morning and remove at night. 1. I am going to suggest that we have the patient going continue with the compression she actually is healed so this is good news for you to use the juxta lite compression wraps which I think is probably the best way to go. 2. I am also can recommend that we have the patient continue to monitor for any signs of worsening or infection. Obviously if anything changes she should let me know. 3. We will have her seen next week just to make sure things still closed up so that we will discharge her at that point. This will give her a week with the juxta lites to make sure she is doing well. We will see patient back for reevaluation in 1 week here in the clinic. If anything worsens or changes patient will contact our office for additional recommendations. Electronic Signature(s) Signed: 07/07/2022 2:36:17 PM By: Worthy Keeler PA-C Entered By: Worthy Keeler on 07/07/2022 14:36:17 -------------------------------------------------------------------------------- HxROS Details Patient Name: Date of Service: Brandi Morgans A. 07/07/2022 1:30 PM Medical Record Number: [id number] Patient Account Number: [id number] Date of Birth/Sex: Treating RN: 04-23-1951 (71 y.o. [name] Primary Care Provider: [name] Other Clinician: Referring Provider: Treating Provider/Extender: [name], [name] in Treatment: 4 Information Obtained From Patient Eyes Medical History: Positive for: Cataracts - Removed Respiratory Medical History: Positive for: Sleep Apnea - no CPAP or BIPAP Past Medical History Notes: Lung  Cancer-surgery 2010 Cardiovascular Medical History: Positive for: Arrhythmia - Ballardfib; Coronary Artery Disease; Hypertension; Peripheral Venous Disease Negative for: Congestive Heart Failure Endocrine Medical History: Positive for: Type II Diabetes Time with diabetes: Since 1996 Treated with: Insulin Blood sugar tested every day: Yes Tested : Musculoskeletal Medical History: Positive for: Osteoarthritis Neurologic [name], Brandi A ([id number]) [phone].pdf Page 7 of 8 Medical History: Positive for: Neuropathy Oncologic Medical History: Past Medical History Notes: Lung Cancer, no radiation or chemo HBO Extended History Items Eyes: Cataracts Immunizations Pneumococcal Vaccine: Received Pneumococcal Vaccination: Yes Received Pneumococcal Vaccination On or After 60th Birthday: Yes Implantable Devices None Hospitalization / Surgery History Type of Hospitalization/Surgery 0/20/2022 cardioverison 06/30/2022 low magnesium Family and Social History Cancer: Yes - Siblings; Diabetes: Yes - Siblings; Heart Disease: Yes - Mother,Father; Hereditary Spherocytosis: No; Hypertension: Yes - Father,Mother; Kidney Disease: No; Lung Disease: No; Seizures: No; Stroke: No; Thyroid Problems: No; Tuberculosis: No; Former smoker - Quit 1986; Marital Status - Single; Alcohol Use: Never; Drug Use: No History; Caffeine Use: Moderate; Financial Concerns: No; Food, Clothing or Shelter Needs: No; Support System Lacking: No; Transportation Concerns: No Electronic Signature(s) Signed: 07/07/2022 5:12:24 PM By: Worthy Keeler PA-C Signed: 07/07/2022 6:11:16 PM By: Deon Pilling RN, BSN Entered By: Deon Pilling on 07/07/2022 13:45:06 -------------------------------------------------------------------------------- SuperBill Details Patient Name: Date of Service: Brandi Morgans A. 07/07/2022 Medical Record Number: [id number] Patient Account Number: [id number] Date of  Birth/Sex: Treating RN: Jan 22, 1951 (71 y.o. [name] Primary Care Provider:  [name] Other Clinician: Referring Provider: Treating Provider/Extender: [name], [name] in Treatment: 4 Diagnosis Coding ICD-10 Codes Code Description I89.0 Lymphedema, not elsewhere classified I87.331 Chronic venous hypertension (idiopathic) with ulcer and inflammation of right lower extremity L97.812 Non-pressure chronic ulcer of other part of right lower leg with fat layer exposed E11.622 Type 2 diabetes mellitus with other skin ulcer I73.89 Other specified peripheral vascular diseases I10 Essential (primary) hypertension Z79.01 Long term (current) use of anticoagulants Facility Procedures Physician Procedures : CPT4 Code Description Modifier [id number] - WC PHYS LEVEL 3 - EST PT ICD-10 Diagnosis Description I89.0 Lymphedema, not elsewhere classified I87.331 Chronic venous hypertension (idiopathic) with ulcer and inflammation of right lower extremity  L97.812 Non-pressure chronic ulcer of other part of right lower leg with fat layer exposed E11.622 Type 2 diabetes mellitus with other skin ulcer Quantity: 1 Electronic Signature(s) Signed: 07/07/2022 2:36:28 PM By: Worthy Keeler PA-C Entered By: Worthy Keeler on 07/07/2022 14:36:28

## 2022-07-09 ENCOUNTER — Ambulatory Visit: Payer: HMO | Admitting: Emergency Medicine

## 2022-07-09 ENCOUNTER — Encounter: Payer: Self-pay | Admitting: Emergency Medicine

## 2022-07-09 DIAGNOSIS — R918 Other nonspecific abnormal finding of lung field: Secondary | ICD-10-CM

## 2022-07-09 DIAGNOSIS — C3492 Malignant neoplasm of unspecified part of left bronchus or lung: Secondary | ICD-10-CM | POA: Diagnosis not present

## 2022-07-09 NOTE — Progress Notes (Signed)
Subjective:    Patient ID: [name], female    DOB: Feb 11, 1951, 71 y.o.   MRN: [id number]  HPI 71 year old former smoker (20 pk-yrs) gt with a history of obesity, coronary disease, GERD with a hiatal hernia, atrial fibrillation, untreated obstructive sleep apnea, diabetes.  She has a history of left upper lobe lobectomy 2010 for stage I a non-small cell lung cancer, has been seen by Dr Gwenette Greet before, surgery by Dr Arlyce Dice. She underwent single vessel CABG in 07/2017 w Dr Roxan Hockey.    ROV 07/09/21 --follow-up visit 71 year old woman with history of CAD/CABG, GERD with hiatal hernia, A. fib, OSA (untreated), diabetes.  She also has a history of a left upper lobe lobectomy (2010) for a non-small cell lung cancer.  Also with mild COPD.  I had planned for her try albuterol to see if she would get benefit and her last visit in March 2022 - she never received it.  She had a small 2 mm right lower lobe pulmonary nodule that was followed for 2 years and deemed benign.  She had a CT scan of the chest 12/29/20 that confirmed this but also identified a new 3 mm right upper lobe nodule.  She returns after follow-up.  CT scan of the chest on 07/07/2021 and reviewed by me, shows that her right lower lobe nodule remains unchanged.  Likewise the 3 mm right upper lobe nodule is unchanged   ROV 07/09/2022 --71 year old woman with a history of a left upper lobe lobectomy for non-small cell lung cancer (2010.  Also with mild COPD, CAD/CABG, GERD, hilar hernia, A-fib, diabetes, untreated OSA.  We have been following small pulmonary nodular disease with serial imaging.  Specifically she had a new right upper lobe 3 mm nodule noted on her surveillance CT 1 year ago.  Follow-up as below. She is feeling well. Quite inactive due to severe knee OA and knee replacement L. No wheeze or cough.   CT chest 07/06/2022 reviewed by me, shows stable mild mediastinal adenopathy, left upper lobe lobectomy, no significant nodular  disease although there is some motion artifact present.   Review of Systems  Constitutional:  Negative for fever and unexpected weight change.  HENT:  Negative for congestion, dental problem, ear pain, nosebleeds, postnasal drip, rhinorrhea, sinus pressure, sneezing, sore throat and trouble swallowing.   Eyes:  Negative for redness and itching.  Respiratory:  Negative for cough, chest tightness, shortness of breath and wheezing.   Cardiovascular:  Positive for palpitations. Negative for leg swelling.  Gastrointestinal:  Negative for nausea and vomiting.  Genitourinary:  Negative for dysuria.  Musculoskeletal:  Negative for joint swelling.  Skin:  Negative for rash.  Neurological:  Negative for headaches.  Hematological:  Does not bruise/bleed easily.  Psychiatric/Behavioral:  Negative for dysphoric mood. The patient is not nervous/anxious.    Past Medical History:  Diagnosis Date   Arthritis    "knees" (02/05/2016)   Cancer ([place]) 5/10   LUL Vats    CHF (congestive heart failure) ([place])    Coronary artery disease    GERD (gastroesophageal reflux disease)    tx meds   H/O hiatal hernia    History of blood transfusion "several"   last april 2015 s/p knee surgery (02/05/2016)   Hypercholesteremia    Hypertension    Lung cancer ([place])    2010, surgery 18% left lung no radiation or chemo   Non-STEMI (non-ST elevated myocardial infarction) ([facility]) 04/07/2012   See cath results below  Paroxysmal atrial fibrillation ([facility]) 04/07/2012   On Warfarin   Peripheral vascular disease ([place]) 8/12   Lt SFA PTA   Presence of stent in LAD coronary artery 04/07/2012   Xience Expedition DES 2.75 mm x 18 mm (dilated to 3.0 mm)   Sleep apnea    does not wear CPAP   Type II diabetes mellitus ([facility])    insulin dependent     Family History  Problem Relation Age of Onset   Hypertension Mother    Coronary artery disease Brother    Hypertension Sister      Social History   Socioeconomic History    Marital status: Single    Spouse name: Not on file   Number of children: Not on file   Years of education: Not on file   Highest education level: Not on file  Occupational History   Occupation: Retired  Tobacco Use   Smoking status: Former    Packs/day: 1.00    Years: 20.00    Total pack years: 20.00    Types: Cigarettes    Quit date: 09/27/1984    Years since quitting: 37.8   Smokeless tobacco: Never  Vaping Use   Vaping Use: Never used  Substance and Sexual Activity   Alcohol use: No   Drug use: No   Sexual activity: Not on file  Other Topics Concern   Not on file  Social History Narrative   Patient lives alone.   Social Determinants of Health   Financial Resource Strain: Not on file  Food Insecurity: No Food Insecurity (05/07/2020)   Hunger Vital Sign    Worried About Running Out of Food in the Last Year: Never true    Ran Out of Food in the Last Year: Never true  Transportation Needs: No Transportation Needs (05/07/2020)   PRAPARE - Hydrologist (Medical): No    Lack of Transportation (Non-Medical): No  Physical Activity: Not on file  Stress: Stress Concern Present (11/14/2019)   [place]    Feeling of Stress : Very much  Social Connections: Not on file  Intimate Partner Violence: Not on file  worked [employer] company, the [employer] [place] native   Allergies  Allergen Reactions   Levofloxacin Itching   Morphine And Related Itching    Pt reports oxycodone history with no allergy.     Outpatient Medications Prior to Visit  Medication Sig Dispense Refill   Blood Glucose Monitoring Suppl ([place]) w/Device KIT      Continuous Blood Gluc Receiver (FREESTYLE LIBRE 2 READER) DEVI See admin instructions.     furosemide (LASIX) 40 MG tablet Take 1 tablet (40 mg total) by mouth 3 (three) times a week. TAKE Monday Wednesday AND FRIDAY 36 tablet 3   insulin  NPH-regular Human (70-30) 100 UNIT/ML injection Inject 20-62 Units into the skin 2 (two) times daily with a meal. 62 units in the morning and 20 units at bedtme     Lancets (ONETOUCH DELICA PLUS [id number]) MISC Apply topically.     lisinopril-hydrochlorothiazide (PRINZIDE,ZESTORETIC) 20-25 MG per tablet Take 1 tablet by mouth daily.     metoCLOPramide (REGLAN) 10 MG tablet Take 10 mg by mouth 2 (two) times daily. Per patient     metoprolol succinate (TOPROL-XL) 50 MG 24 hr tablet TAKE 1 TABLET BY MOUTH EVERY DAY 30 tablet 0   metoprolol tartrate (LOPRESSOR) 25 MG tablet Take 1 table as needed  for heart rate above 120bpm. 30 tablet 6   ONETOUCH VERIO test strip 1 each 2 (two) times daily.     pantoprazole (PROTONIX) 40 MG tablet Take 1 tablet (40 mg total) by mouth daily. 90 tablet 3   simvastatin (ZOCOR) 40 MG tablet TAKE 1 TABLET BY MOUTH DAILY AT 6 PM. 90 tablet 3   spironolactone (ALDACTONE) 25 MG tablet Take 1 tablet (25 mg total) by mouth 3 (three) times a week. TAKE Monday Wednesday AND Friday 36 tablet 3   Vitamin D, Cholecalciferol, 1000 units TABS Take 1,000 Units by mouth every morning.      warfarin (COUMADIN) 7.5 MG tablet TAKE 1/2 TO 1 TABLET DAILY AS DIRECTED BY COUMADIN CLINIC 90 tablet 1   No facility-administered medications prior to visit.        Objective:   Physical Exam Vitals:   07/09/22 1351  BP: 126/70  Pulse: 94  Temp: 98.4 F (36.9 C)  TempSrc: Oral  SpO2: 100%  Weight: 292 lb 9.6 oz (132.7 kg)  Height: 5' 3"  (1.6 m)   Gen: Pleasant, obese woman, in no distress,  normal affect, in a wheelchair  ENT: No lesions,  mouth clear,  oropharynx clear, no postnasal drip  Neck: No JVD, no stridor  Lungs: No use of accessory muscles, clear bilaterally  Cardiovascular: RRR, heart sounds normal, no murmur or gallops, no peripheral edema  Musculoskeletal: No deformities, no cyanosis or clubbing  Neuro: alert, non focal  Skin: Warm, no lesions or rash       Assessment & Plan:  Cancer of left lung parenchyma No evidence of recurrence  Pulmonary nodules/lesions, multiple Small nodules 2 to 3 mm.  Most recent CT had some motion artifact and precise size comparison not possible but there has not been any significant interval change, no alarming nodules seen.  She needs 1 more repeat CT in October 2024 to establish 2 years of stability.  If no change at that time then she likely will not need any further scans.  She quit smoking in 1986 so I do not believe she qualifies anymore for a transition to lung cancer screening CTs.  Baltazar Apo, MD, PhD 07/09/2022, 2:04 PM [facility] [phone] or if no answer [phone]

## 2022-07-09 NOTE — Addendum Note (Signed)
Addended by: Gavin Potters R on: 07/09/2022 02:21 PM   Modules accepted: Orders

## 2022-07-09 NOTE — Assessment & Plan Note (Signed)
Small nodules 2 to 3 mm.  Most recent CT had some motion artifact and precise size comparison not possible but there has not been any significant interval change, no alarming nodules seen.  She needs 1 more repeat CT in October 2024 to establish 2 years of stability.  If no change at that time then she likely will not need any further scans.  She quit smoking in 1986 so I do not believe she qualifies anymore for a transition to lung cancer screening CTs.

## 2022-07-09 NOTE — Assessment & Plan Note (Signed)
No evidence of recurrence 

## 2022-07-09 NOTE — Patient Instructions (Signed)
We reviewed your CT scan of the chest today. We will plan to repeat your CT chest without contrast in October 2024.  If your pulmonary nodules are stable at that time then we likely will not need to keep following. Follow with Dr. Lamonte Sakai in October 2024 to review your CT scan together.

## 2022-07-14 ENCOUNTER — Encounter (HOSPITAL_BASED_OUTPATIENT_CLINIC_OR_DEPARTMENT_OTHER): Payer: HMO | Admitting: Internal Medicine

## 2022-07-14 DIAGNOSIS — I87331 Chronic venous hypertension (idiopathic) with ulcer and inflammation of right lower extremity: Secondary | ICD-10-CM | POA: Diagnosis not present

## 2022-07-16 NOTE — Progress Notes (Signed)
1 5 []  - 0 Complex Wound Cleansing - multiple wounds X- 1 5 Wound Imaging (photographs - any number of wounds) []  - 0 Wound Tracing (instead of photographs) X- 1 5 Simple Wound Measurement - one wound []  - 0 Complex Wound Measurement - multiple wounds INTERVENTIONS - Wound Dressings []  - 0 Small Wound Dressing one or multiple wounds []  - 0 Medium Wound Dressing one or multiple wounds []  - 0 Large Wound Dressing one or multiple wounds []  - 0 Application of Medications - topical []  - 0 Application of Medications - injection INTERVENTIONS - Miscellaneous []  - 0 External ear exam []  - 0 Specimen Collection (cultures, biopsies, blood, body fluids, etc.) []  - 0 Specimen(s) / Culture(s) sent or taken to Lab for analysis []  - 0 Patient Transfer (multiple staff / Civil Service fast streamer / Similar devices) []  - 0 Simple Staple / Suture removal (25 or less) []  - 0 Complex Staple / Suture removal (26 or more) []  - 0 Hypo / Hyperglycemic Management (close monitor of Blood Glucose) []  - 0 Ankle / Brachial Index (ABI) - do not check if billed separately [name], [name] [name] ([id number]) [id number] Page 3 of 8 X- 1 5 Vital Signs Has the patient been seen at the hospital within the last three years: Yes Total Score: 80 Level Of Care: New/Established - Level 3 Electronic Signature(s) Signed: 07/15/2022 4:05:37 PM By: Rhae Hammock RN Entered By: Rhae Hammock on 07/14/2022 15:18:42 -------------------------------------------------------------------------------- Encounter Discharge Information Details Patient Name: Date of Service: [name]. 07/14/2022 2:15 PM Medical Record Number: [id number] Patient Account Number: [id number] Date of  Birth/Sex: Treating RN: [birth year] (71 y.o. [name], [name] Primary Care Marko Skalski: [name] Other Clinician: Referring Lynzy Rawles: Treating Artyom Stencel/Extender: [name] in Treatment: 5 Encounter Discharge Information Items Discharge Condition: Stable Ambulatory Status: Wheelchair Discharge Destination: Home Transportation: Private Auto Accompanied By: self Schedule Follow-up Appointment: Yes Clinical Summary of Care: Patient Declined Electronic Signature(s) Signed: 07/15/2022 4:05:37 PM By: Rhae Hammock RN Entered By: Rhae Hammock on 07/14/2022 15:18:15 -------------------------------------------------------------------------------- Lower Extremity Assessment Details Patient Name: Date of Service: [name]. 07/14/2022 2:15 PM Medical Record Number: [id number] Patient Account Number: [id number] Date of Birth/Sex: Treating RN: 04/29/1951 (71 y.o. [name] Primary Care Lynore Coscia: [name] Other Clinician: Referring Lennix Rotundo: Treating [name]/Extender: [name] in Treatment: 5 Edema Assessment Assessed: [name]: No] [Right: No] Edema: [Left: Yes] [Right: Yes] Calf Left: Right: Point of Measurement: 34.4 cm From Medial Instep 53.3 cm 52 cm Ankle Left: Right: Point of Measurement: 10 cm From Medial Instep 30.7 cm 24 cm Electronic Signature(s) Signed: 07/14/2022 4:47:05 PM By: Erenest Blank Signed: 07/15/2022 6:08:05 PM By: Deon Pilling RN, BSN [name] ([id number]) By: Deon Pilling RN, BSN [id number].pdf Page 4 of 8 Signed: 07/15/2022 6:08:05 PM Entered By: Erenest Blank on 07/14/2022 14:54:30 -------------------------------------------------------------------------------- Multi Wound Chart Details Patient Name: Date of Service: [name]. 07/14/2022 2:15 PM Medical Record Number: [id number] Patient Account  Number: [id number] Date of Birth/Sex: Treating RN: 20-Jan-1951 (71 y.o. 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 07/14/2022 2:15 PM Medical Record Number: [id number] Patient Account Number: [id number] Date of Birth/Sex: Treating RN: 12-16-1950 (71 y.o. [name], [name] Primary Care Sayyid Harewood: [name] Other Clinician: Referring Athanasius Kesling: Treating [name]/Extender: [name] in Treatment: 5 Visit Information History Since Last Visit Added or deleted any medications: No Patient Arrived: Wheel Chair Any new allergies or adverse reactions: No Arrival Time: 14:52 Had [name] fall or experienced change in No Accompanied By: Son activities of daily living that may affect Transfer Assistance: None risk of falls: Patient Requires Transmission-Based Precautions: No Signs or symptoms of abuse/neglect since last visito No Patient Has Alerts: Yes Hospitalized since last visit: No Patient Alerts: VVS ABI [facility] [place] 10/23/21 Implantable device outside of the clinic excluding No TBI L 0.29 R 0.28 cellular tissue based products placed in the center since last visit: Pain Present Now: No Electronic Signature(s) Signed: 07/14/2022 4:47:05 PM By: Erenest Blank Entered By: Erenest Blank on 07/14/2022 14:53:06 -------------------------------------------------------------------------------- Clinic Level of Care Assessment Details Patient Name: Date of Service: [name]. 07/14/2022 2:15 PM Medical Record Number: [id number] Patient Account Number: [id number] Date of Birth/Sex: Treating RN: 09-25-1951 (71 y.o. [name], [name] Primary Care Vershawn Westrup: [name] Other Clinician: Referring Merly Hinkson: Treating Kamen Hanken/Extender: [name] in Treatment: 5 Clinic Level of Care Assessment Items TOOL 4 Quantity Score X- 1 0 Use when  only an EandM is performed on FOLLOW-UP visit ASSESSMENTS - Nursing Assessment / Reassessment X- 1 10 Reassessment of Co-morbidities (includes updates in patient status) X- 1 5 Reassessment of Adherence to Treatment Plan ASSESSMENTS - Wound and Skin [name] ssessment / Reassessment X - Simple Wound Assessment / Reassessment - one wound 1 5 []  - 0 Complex Wound Assessment / Reassessment - multiple wounds []  - 0 Dermatologic / Skin Assessment (not related to wound area) ASSESSMENTS - Focused Assessment X- 1 5 Circumferential Edema Measurements - multi extremities []  - 0 Nutritional Assessment / Counseling / Intervention []  - 0 Lower Extremity Assessment (monofilament, tuning fork, pulses) [name] ([id number]) [id number] Page 2 of 8 []  - 0 Peripheral Arterial Disease Assessment (using hand held doppler) ASSESSMENTS - Ostomy and/or Continence Assessment and Care []  - 0 Incontinence Assessment and Management []  - 0 Ostomy Care Assessment and Management (repouching, etc.) PROCESS - Coordination of Care X - Simple Patient / Family Education for ongoing care 1 15 []  - 0 Complex (extensive) Patient / Family Education for ongoing care X- 1 10 Staff obtains Programmer, systems, Records, T Results / Process Orders est []  - 0 Staff telephones HHA, Nursing Homes / Clarify orders / etc []  - 0 Routine Transfer to another Facility (non-emergent condition) []  - 0 Routine Hospital Admission (non-emergent condition) []  - 0 New Admissions / Biomedical engineer / Ordering NPWT Apligraf, etc. , []  - 0 Emergency Hospital Admission (emergent condition) X- 1 10 Simple Discharge Coordination []  - 0 Complex (extensive) Discharge Coordination PROCESS - Special Needs []  - 0 Pediatric / Minor Patient Management []  - 0 Isolation Patient Management []  - 0 Hearing / Language / Visual special needs []  - 0 Assessment of Community assistance (transportation, D/C  planning, etc.) []  - 0 Additional assistance / Altered mentation []  - 0 Support Surface(s) Assessment (bed, cushion, seat, etc.) INTERVENTIONS - Wound Cleansing / Measurement X - Simple Wound Cleansing - one wound  Posterior Lower Leg Secondary Diabetic Wound/Ulcer of the Lower Extremity Wounding Event: Gradually Appeared Etiology: Date Acquired: 06/16/2022 Wound Healed - Epithelialized Weeks Of Treatment: 3 Status: Clustered Wound: No Comorbid Cataracts, Sleep Apnea, Arrhythmia, Coronary Artery Disease, History: Hypertension, Peripheral Venous  Disease, Type II Diabetes, Osteoarthritis, Neuropathy Photos Wound Measurements Length: (cm) 0 % Reduction in Area: 100% Width: (cm) 0 % Reduction in Volume: 100% Depth: (cm) 0 Epithelialization: Small (1-33%) Area: (cm) 0 Tunneling: No Volume: (cm) 0 Undermining: No Wound Description Classification: Full Thickness Without Exposed Support Structures Foul Odor After Cleansing: No Wound Margin: Distinct, outline attached Slough/Fibrino No Exudate Amount: None Present Wound Bed Granulation Amount: Large (67-100%) Exposed Structure Necrotic Amount: Small (1-33%) Fascia Exposed: No Fat Layer (Subcutaneous Tissue) Exposed: No Tendon Exposed: No Muscle Exposed: No Joint Exposed: No Bone Exposed: No Periwound Skin Texture Texture Color No Abnormalities Noted: No No Abnormalities Noted: No Callus: No Atrophie Blanche: No Crepitus: No Cyanosis: No Excoriation: No Ecchymosis: No Induration: No Erythema: No Rash: No Hemosiderin Staining: No Scarring: No Mottled: No Pallor: No Moisture Rubor: No No Abnormalities Noted: No Dry / Scaly: No Maceration: No Treatment Notes Wound #4 (Lower Leg) Wound Laterality: Left, Posterior Cleanser Peri-Wound Care [name] ([id number]) [id number] Page 8 of 8 Topical Primary Dressing Secondary Dressing Secured With Compression Wrap Compression Stockings Add-Ons Electronic Signature(s) Signed: 07/14/2022 4:47:05 PM By: Erenest Blank Signed: 07/15/2022 6:08:05 PM By: Deon Pilling RN, BSN Entered By: Erenest Blank on 07/14/2022 15:08:40 -------------------------------------------------------------------------------- Vitals Details Patient Name: Date of Service: [name]. 07/14/2022 2:15 PM Medical Record Number: [id number] Patient Account Number: [id number] Date of Birth/Sex: Treating RN: 12/05/1950 (71 y.o. [name], Tammi Klippel Primary Care Stryder Poitra: [name] Other  Clinician: Referring Pama Roskos: Treating Feige Lowdermilk/Extender: [name] in Treatment: 5 Vital Signs Time Taken: 14:53 Pulse (bpm): 90 Height (in): 63 Respiratory Rate (breaths/min): 18 Weight (lbs): 284 Blood Pressure (mmHg): 142/78 Body Mass Index (BMI): 50.3 Reference Range: 80 - 120 mg / dl Electronic Signature(s) Signed: 07/14/2022 4:47:05 PM By: Erenest Blank Entered By: Erenest Blank on 07/14/2022 14:53:45  [name] ([id number]) [id number] Page 1 of 8 Visit Report for 07/14/2022 Arrival Information Details Patient Name: Date of Service: [name]. 07/14/2022 2:15 PM Medical Record Number: [id number] Patient Account Number: [id number] Date of Birth/Sex: Treating RN: 12-16-1950 (71 y.o. [name], [name] Primary Care Sayyid Harewood: [name] Other Clinician: Referring Athanasius Kesling: Treating [name]/Extender: [name] in Treatment: 5 Visit Information History Since Last Visit Added or deleted any medications: No Patient Arrived: Wheel Chair Any new allergies or adverse reactions: No Arrival Time: 14:52 Had [name] fall or experienced change in No Accompanied By: Son activities of daily living that may affect Transfer Assistance: None risk of falls: Patient Requires Transmission-Based Precautions: No Signs or symptoms of abuse/neglect since last visito No Patient Has Alerts: Yes Hospitalized since last visit: No Patient Alerts: VVS ABI LandR [place] 10/23/21 Implantable device outside of the clinic excluding No TBI L 0.29 R 0.28 cellular tissue based products placed in the center since last visit: Pain Present Now: No Electronic Signature(s) Signed: 07/14/2022 4:47:05 PM By: Erenest Blank Entered By: Erenest Blank on 07/14/2022 14:53:06 -------------------------------------------------------------------------------- Clinic Level of Care Assessment Details Patient Name: Date of Service: [name]. 07/14/2022 2:15 PM Medical Record Number: [id number] Patient Account Number: [id number] Date of Birth/Sex: Treating RN: 09-25-1951 (71 y.o. [name], [name] Primary Care Vershawn Westrup: [name] Other Clinician: Referring Merly Hinkson: Treating Kamen Hanken/Extender: [name] in Treatment: 5 Clinic Level of Care Assessment Items TOOL 4 Quantity Score X- 1 0 Use when  only an EandM is performed on FOLLOW-UP visit ASSESSMENTS - Nursing Assessment / Reassessment X- 1 10 Reassessment of Co-morbidities (includes updates in patient status) X- 1 5 Reassessment of Adherence to Treatment Plan ASSESSMENTS - Wound and Skin [name] ssessment / Reassessment X - Simple Wound Assessment / Reassessment - one wound 1 5 []  - 0 Complex Wound Assessment / Reassessment - multiple wounds []  - 0 Dermatologic / Skin Assessment (not related to wound area) ASSESSMENTS - Focused Assessment X- 1 5 Circumferential Edema Measurements - multi extremities []  - 0 Nutritional Assessment / Counseling / Intervention []  - 0 Lower Extremity Assessment (monofilament, tuning fork, pulses) [name] ([id number]) [id number] Page 2 of 8 []  - 0 Peripheral Arterial Disease Assessment (using hand held doppler) ASSESSMENTS - Ostomy and/or Continence Assessment and Care []  - 0 Incontinence Assessment and Management []  - 0 Ostomy Care Assessment and Management (repouching, etc.) PROCESS - Coordination of Care X - Simple Patient / Family Education for ongoing care 1 15 []  - 0 Complex (extensive) Patient / Family Education for ongoing care X- 1 10 Staff obtains Programmer, systems, Records, T Results / Process Orders est []  - 0 Staff telephones HHA, Nursing Homes / Clarify orders / etc []  - 0 Routine Transfer to another Facility (non-emergent condition) []  - 0 Routine Hospital Admission (non-emergent condition) []  - 0 New Admissions / Biomedical engineer / Ordering NPWT Apligraf, etc. , []  - 0 Emergency Hospital Admission (emergent condition) X- 1 10 Simple Discharge Coordination []  - 0 Complex (extensive) Discharge Coordination PROCESS - Special Needs []  - 0 Pediatric / Minor Patient Management []  - 0 Isolation Patient Management []  - 0 Hearing / Language / Visual special needs []  - 0 Assessment of Community assistance (transportation, D/C  planning, etc.) []  - 0 Additional assistance / Altered mentation []  - 0 Support Surface(s) Assessment (bed, cushion, seat, etc.) INTERVENTIONS - Wound Cleansing / Measurement X - Simple Wound Cleansing - one wound

## 2022-07-21 ENCOUNTER — Encounter (HOSPITAL_BASED_OUTPATIENT_CLINIC_OR_DEPARTMENT_OTHER): Payer: HMO | Admitting: Physician Assistant

## 2022-07-22 ENCOUNTER — Ambulatory Visit: Payer: HMO | Attending: Cardiovascular Disease

## 2022-07-22 DIAGNOSIS — Z7901 Long term (current) use of anticoagulants: Secondary | ICD-10-CM

## 2022-07-22 DIAGNOSIS — I480 Paroxysmal atrial fibrillation: Secondary | ICD-10-CM | POA: Diagnosis not present

## 2022-07-22 LAB — POCT INR: INR: 2.3 (ref 2.0–3.0)

## 2022-07-22 NOTE — Patient Instructions (Signed)
Description   Continue 1 tablet daily except for 1/2 tablet on Monday, Wednesday and Friday. Recheck INR in 6 weeks. [facility] [phone]

## 2022-07-29 ENCOUNTER — Ambulatory Visit: Payer: HMO

## 2022-08-04 ENCOUNTER — Encounter (HOSPITAL_BASED_OUTPATIENT_CLINIC_OR_DEPARTMENT_OTHER): Payer: HMO | Attending: Physician Assistant | Admitting: Internal Medicine

## 2022-08-04 DIAGNOSIS — E1151 Type 2 diabetes mellitus with diabetic peripheral angiopathy without gangrene: Secondary | ICD-10-CM | POA: Insufficient documentation

## 2022-08-04 DIAGNOSIS — E11622 Type 2 diabetes mellitus with other skin ulcer: Secondary | ICD-10-CM | POA: Insufficient documentation

## 2022-08-04 DIAGNOSIS — I10 Essential (primary) hypertension: Secondary | ICD-10-CM | POA: Insufficient documentation

## 2022-08-04 DIAGNOSIS — I872 Venous insufficiency (chronic) (peripheral): Secondary | ICD-10-CM | POA: Insufficient documentation

## 2022-08-04 DIAGNOSIS — L97821 Non-pressure chronic ulcer of other part of left lower leg limited to breakdown of skin: Secondary | ICD-10-CM | POA: Insufficient documentation

## 2022-08-04 DIAGNOSIS — L97812 Non-pressure chronic ulcer of other part of right lower leg with fat layer exposed: Secondary | ICD-10-CM | POA: Insufficient documentation

## 2022-08-04 DIAGNOSIS — I890 Lymphedema, not elsewhere classified: Secondary | ICD-10-CM | POA: Insufficient documentation

## 2022-08-04 NOTE — Progress Notes (Signed)
GENIYA, FULGHAM A ([id number]) [id number] Page 1 of 6 Visit Report for 08/04/2022 HPI Details Patient Name: Date of Service: ANETRA, CZERWINSKI A. 08/04/2022 11:00 A M Medical Record Number: [id number] Patient Account Number: [id number] Date of Birth/Sex: Treating RN: [birth year] (71 y.o. F) Primary Care Provider: [name] Other Clinician: Referring Provider: Treating Provider/Extender: [name] in Treatment: 8 History of Present Illness HPI Description: 08/05/2021 upon evaluation today patient has bilateral wounds on her lower extremities. She has been tolerating the dressing changes without complication at home but unfortunately is not really seeing signs of improvement like she would like to see. She does have what appears to be bilateral lymphedema due to chronic venous insufficiency most likely. She is on fluid pills and takes them regularly. She really does not sit with her legs elevated much she has a recliner but again does not use that often. I think she should be using this more to get her feet up. Subsequently she tells me this has been going on for several weeks to even months. She finally decided to make a referral herself to come in to be seen. Patient does have a history of diabetes mellitus type 2, some evidence of potential peripheral vascular disease as she is noncompressible with her ABIs in the office today, hypertension, and she is on long-term anticoagulant therapy. 08/12/2021 upon evaluation today patient's wounds appear to be doing well in regard to her legs currently. Actually very pleased with where things stand and I do not see any signs of active infection which is great news. No fevers, chills, nausea, vomiting, or diarrhea. 08/19/2021 upon evaluation today patient appears to be doing well with regard to her legs. Everything actually appears to be healing quite nicely with doing extremely well.  Fortunately there does not appear to be any signs of active infection which is great news. Readmission: 06-09-2022 upon evaluation today patient appears for reevaluation in the clinic concerning issues that she has been having with her leg. This is actually her right leg at this time though when I saw her previously was both legs. Fortunately there does not appear to be any evidence of active infection at this time which is great news. No fevers, chills, nausea, vomiting, or diarrhea. Her past medical history really has not changed significantly since last time I saw her. I did discuss with her today how she has been doing with wearing her compression socks she tells me she really has not been wearing those because "she cannot get them on her son is not able to help. She tells me that he works at night and by the time he comes and goes to bed she is not up yet and then she wakes up later in the past do not cross. I talked her today about actually having a time later in the day when he could help with getting the socks on and that is a possibility. With that being said she currently states that she had not thought through that completely I am not sure if that something she has been worked out or not. 06-16-2022 upon evaluation today patient appears to be doing well currently in regard to her wounds all things considered. I am seeing excellent signs of improvement I think were getting very close to complete resolution which is great news and overall I do not see anything that seems to be worsening significantly also great news. In general I think we are on the right track here. She  does have a follow-up with vein and vascular next Friday. 06-23-2022 upon evaluation today the wound on patient's right leg actually appears to be healed unfortunately she has an area on the left leg which is still open. I am thinking that this may be brand-new skin on the right leg so I think we probably should still continue to  wrap her for at least 1 more week and I discussed that with her today. Otherwise I think that we are on the right track for the most part. 07-07-2022 upon evaluation today patient appears to be doing well currently in regard to her wound she actually appears to be healed which is good news. Fortunately I do not see any signs of infection. The downside is that she actually did have her arterial studies that showed that on 06-25-2022 that her TBI on the right was 0.30 and on the left was 0.25. This is obviously very low and I think that that is going to continue to be an ongoing issue and something has to be monitored for her. I discussed that with the patient today. I did review Dr. Unk Lightning note had vascular surgery as well and he noted that to be honest right now being that she is not a great surgical candidate he would not want to proceed with any further intervention though she does not feel her stay healed then the neck step would be to do endovascular intervention. 10/18; the patient comes in without her juxta lite stockings on since yesterday. She said they were too tight and were cutting into her upper posterior calf. She took them off but did not bring them in today. She does not have an open wound however her edema control is very poor and she is certainly at risk for reopening 11/8; patient was discharged 2 weeks ago in bilateral juxta lite stockings. Apparently she noted drainage coming out of both her legs about a week ago she has not been wearing the stockings since. She has reopening of the area on the right posterior calf and 2 small areas anteriorly and laterally on the left. Most substantial areas on the right posterior calf Electronic Signature(s) Signed: 08/04/2022 3:30:35 PM By: Linton Ham MD Entered By: Linton Ham on 08/04/2022 12:20:01 Jenetta Downer A ([id number]) [id number] Page 2 of  6 -------------------------------------------------------------------------------- Physical Exam Details Patient Name: Date of Service: MADALINA, ROSMAN A. 08/04/2022 11:00 A M Medical Record Number: [id number] Patient Account Number: [id number] Date of Birth/Sex: Treating RN: Feb 03, 1951 (71 y.o. F) Primary Care Provider: [name] Other Clinician: Referring Provider: Treating Provider/Extender: [name] in Treatment: 8 Cardiovascular Pedal pulses are palpable. Edema present in both extremities.This is largely nonpitting. There is no clear evidence of cellulitis and I do not believe there is any suspicion for DVT. Notes Wound exam; she has a large but superficial wound on the posterior right calf 2 small areas on the left medial anterior lower leg and the left lateral. These are draining as is the right leg. There is no evidence of infection and no evidence of an acute DVT Electronic Signature(s) Signed: 08/04/2022 3:30:35 PM By: Linton Ham MD Entered By: Linton Ham on 08/04/2022 12:30:58 -------------------------------------------------------------------------------- Physician Orders Details Patient Name: Date of Service: [name] 08/04/2022 11:00 A M Medical Record Number: [id number] Patient Account Number: [id number] Date of Birth/Sex: Treating RN: 01/23/1951 (71 y.o. [name], [name] Primary Care Provider: [name] Other Clinician: Referring Provider: Treating Provider/Extender: Drema Dallas  Weeks in Treatment: 8 Verbal / Phone Orders: No Diagnosis Coding Follow-up Appointments ppointment in 1 week. Burman Blacksmith on Wednesday's Return A Bathing/ Shower/ Hygiene May shower with protection but do not get wound dressing(s) wet. Edema Control - Lymphedema / SCD / Other Elevate legs to the level of the heart or above for 30 minutes daily and/or when sitting, a frequency of: Avoid  standing for long periods of time. Wound Treatment Wound #1 - Lower Leg Wound Laterality: Left, Anterior Cleanser: Soap and Water Discharge Instructions: May shower and wash wound with dial antibacterial soap and water prior to dressing change. Prim Dressing: KerraCel Ag Gelling Fiber Dressing, 4x5 in (silver alginate) ary Discharge Instructions: Apply silver alginate to wound bed as instructed Secondary Dressing: ABD Pad, 5x9 Discharge Instructions: Apply over primary dressing as directed. Compression Wrap: Unnaboot w/Calamine, 4x10 (in/yd) Discharge Instructions: Apply Unnaboot as directed. Wound #3R - Lower Leg Wound Laterality: Right, Posterior Cleanser: Soap and Water Discharge Instructions: May shower and wash wound with dial antibacterial soap and water prior to dressing change. Prim Dressing: KerraCel Ag Gelling Fiber Dressing, 4x5 in (silver alginate) ary Discharge Instructions: Apply silver alginate to wound bed as instructed Secondary Dressing: ABD Pad, 5x9 Discharge Instructions: Apply over primary dressing as directed. JASPER, RUMINSKI A ([id number]) [id number] Page 3 of 6 Compression Wrap: Unnaboot w/Calamine, 4x10 (in/yd) Discharge Instructions: Apply Unnaboot as directed. Electronic Signature(s) Signed: 08/04/2022 3:30:35 PM By: Linton Ham MD Signed: 08/04/2022 3:55:23 PM By: Rhae Hammock RN Entered By: Rhae Hammock on 08/04/2022 12:01:10 -------------------------------------------------------------------------------- Problem List Details Patient Name: Date of Service: [name] 08/04/2022 11:00 A M Medical Record Number: [id number] Patient Account Number: [id number] Date of Birth/Sex: Treating RN: [birth year] (70 y.o. F) Primary Care Provider: [name] Other Clinician: Referring Provider: Treating Provider/Extender: [name] in Treatment: 8 Active  Problems ICD-10 Encounter Code Description Active Date MDM Diagnosis I89.0 Lymphedema, not elsewhere classified 06/09/2022 No Yes I87.331 Chronic venous hypertension (idiopathic) with ulcer and inflammation of right 06/09/2022 No Yes lower extremity L97.821 Non-pressure chronic ulcer of other part of left lower leg limited to breakdown 08/04/2022 No Yes of skin L97.812 Non-pressure chronic ulcer of other part of right lower leg with fat layer 06/09/2022 No Yes exposed E11.622 Type 2 diabetes mellitus with other skin ulcer 06/09/2022 No Yes I73.89 Other specified peripheral vascular diseases 06/09/2022 No Yes I10 Essential (primary) hypertension 06/09/2022 No Yes Z79.01 Long term (current) use of anticoagulants 06/09/2022 No Yes Inactive Problems Resolved Problems Electronic Signature(s) Signed: 08/04/2022 3:30:35 PM By: Linton Ham MD Jenetta Downer A [phone]: Linton Ham MD [phone].pdf Page 4 of 6 Signed: 08/04/2022 3:30:35 PM Entered By: Linton Ham on 08/04/2022 12:33:21 -------------------------------------------------------------------------------- Progress Note Details Patient Name: Date of Service: LUCINDIA, LEMLEY A. 08/04/2022 11:00 A M Medical Record Number: [id number] Patient Account Number: [id number] Date of Birth/Sex: Treating RN: Apr 18, 1951 (71 y.o. F) Primary Care Provider: [name] Other Clinician: Referring Provider: Treating Provider/Extender: [name] in Treatment: 8 Subjective History of Present Illness (HPI) 08/05/2021 upon evaluation today patient has bilateral wounds on her lower extremities. She has been tolerating the dressing changes without complication at home but unfortunately is not really seeing signs of improvement like she would like to see. She does have what appears to be bilateral lymphedema due to chronic venous insufficiency most likely. She is on fluid pills  and takes them regularly. She really does not sit with her legs elevated much she  has a recliner but again does not use that often. I think she should be using this more to get her feet up. Subsequently she tells me this has been going on for several weeks to even months. She finally decided to make a referral herself to come in to be seen. Patient does have a history of diabetes mellitus type 2, some evidence of potential peripheral vascular disease as she is noncompressible with her ABIs in the office today, hypertension, and she is on long-term anticoagulant therapy. 08/12/2021 upon evaluation today patient's wounds appear to be doing well in regard to her legs currently. Actually very pleased with where things stand and I do not see any signs of active infection which is great news. No fevers, chills, nausea, vomiting, or diarrhea. 08/19/2021 upon evaluation today patient appears to be doing well with regard to her legs. Everything actually appears to be healing quite nicely with doing extremely well. Fortunately there does not appear to be any signs of active infection which is great news. Readmission: 06-09-2022 upon evaluation today patient appears for reevaluation in the clinic concerning issues that she has been having with her leg. This is actually her right leg at this time though when I saw her previously was both legs. Fortunately there does not appear to be any evidence of active infection at this time which is great news. No fevers, chills, nausea, vomiting, or diarrhea. Her past medical history really has not changed significantly since last time I saw her. I did discuss with her today how she has been doing with wearing her compression socks she tells me she really has not been wearing those because "she cannot get them on her son is not able to help. She tells me that he works at night and by the time he comes and goes to bed she is not up yet and then she wakes up later in the past  do not cross. I talked her today about actually having a time later in the day when he could help with getting the socks on and that is a possibility. With that being said she currently states that she had not thought through that completely I am not sure if that something she has been worked out or not. 06-16-2022 upon evaluation today patient appears to be doing well currently in regard to her wounds all things considered. I am seeing excellent signs of improvement I think were getting very close to complete resolution which is great news and overall I do not see anything that seems to be worsening significantly also great news. In general I think we are on the right track here. She does have a follow-up with vein and vascular next Friday. 06-23-2022 upon evaluation today the wound on patient's right leg actually appears to be healed unfortunately she has an area on the left leg which is still open. I am thinking that this may be brand-new skin on the right leg so I think we probably should still continue to wrap her for at least 1 more week and I discussed that with her today. Otherwise I think that we are on the right track for the most part. 07-07-2022 upon evaluation today patient appears to be doing well currently in regard to her wound she actually appears to be healed which is good news. Fortunately I do not see any signs of infection. The downside is that she actually did have her arterial studies that showed that on 06-25-2022 that her TBI on the  right was 0.30 and on the left was 0.25. This is obviously very low and I think that that is going to continue to be an ongoing issue and something has to be monitored for her. I discussed that with the patient today. I did review Dr. Unk Lightning note had vascular surgery as well and he noted that to be honest right now being that she is not a great surgical candidate he would not want to proceed with any further intervention though she does not feel her  stay healed then the neck step would be to do endovascular intervention. 10/18; the patient comes in without her juxta lite stockings on since yesterday. She said they were too tight and were cutting into her upper posterior calf. She took them off but did not bring them in today. She does not have an open wound however her edema control is very poor and she is certainly at risk for reopening 11/8; patient was discharged 2 weeks ago in bilateral juxta lite stockings. Apparently she noted drainage coming out of both her legs about a week ago she has not been wearing the stockings since. She has reopening of the area on the right posterior calf and 2 small areas anteriorly and laterally on the left. Most substantial areas on the right posterior calf Objective Constitutional Vitals Time Taken: 11:37 AM, Height: 63 in, Weight: 284 lbs, BMI: 50.3, Temperature: 97.9 F, Pulse: 103 bpm, Respiratory Rate: 18 breaths/min, Blood Pressure: 168/83 mmHg. FLOREAN, HOOBLER A ([id number]) [id number] Page 5 of 6 Cardiovascular Pedal pulses are palpable. Edema present in both extremities.This is largely nonpitting. There is no clear evidence of cellulitis and I do not believe there is any suspicion for DVT. General Notes: Wound exam; she has a large but superficial wound on the posterior right calf 2 small areas on the left medial anterior lower leg and the left lateral. These are draining as is the right leg. There is no evidence of infection and no evidence of an acute DVT Integumentary (Hair, Skin) Wound #1 status is Open. Original cause of wound was Blister. The date acquired was: 06/23/2021. The wound has been in treatment 52 weeks. The wound is located on the Left,Anterior Lower Leg. The wound measures 0.6cm length x 0.5cm width x 0.1cm depth; 0.236cm^2 area and 0.024cm^3 volume. There is no tunneling or undermining noted. There is a medium amount of drainage noted. The wound  margin is distinct with the outline attached to the wound base. There is small (1-33%) red granulation within the wound bed. There is no necrotic tissue within the wound bed. The periwound skin appearance exhibited: Dry/Scaly. The periwound skin appearance did not exhibit: Callus, Crepitus, Excoriation, Induration, Rash, Scarring, Maceration, Atrophie Blanche, Cyanosis, Ecchymosis, Hemosiderin Staining, Mottled, Pallor, Rubor, Erythema. Wound #3R status is Open. Original cause of wound was Blister. The date acquired was: 05/26/2022. The wound has been in treatment 8 weeks. The wound is located on the Right,Posterior Lower Leg. The wound measures 3.2cm length x 4cm width x 0.1cm depth; 10.053cm^2 area and 1.005cm^3 volume. There is no tunneling or undermining noted. There is a none present amount of drainage noted. The wound margin is distinct with the outline attached to the wound base. There is medium (34-66%) granulation within the wound bed. There is a medium (34-66%) amount of necrotic tissue within the wound bed including Adherent Slough. The periwound skin appearance exhibited: Maceration. The periwound skin appearance did not exhibit: Callus, Crepitus, Excoriation, Induration, Rash, Scarring, Dry/Scaly, Atrophie Blanche, Cyanosis,  Ecchymosis, Hemosiderin Staining, Mottled, Pallor, Rubor, Erythema. Assessment Active Problems ICD-10 Lymphedema, not elsewhere classified Chronic venous hypertension (idiopathic) with ulcer and inflammation of right lower extremity Non-pressure chronic ulcer of other part of right lower leg with fat layer exposed Type 2 diabetes mellitus with other skin ulcer Other specified peripheral vascular diseases Essential (primary) hypertension Long term (current) use of anticoagulants Plan Follow-up Appointments: Return Appointment in 1 week. Burman Blacksmith on Wednesday's Bathing/ Shower/ Hygiene: May shower with protection but do not get wound dressing(s) wet. Edema  Control - Lymphedema / SCD / Other: Elevate legs to the level of the heart or above for 30 minutes daily and/or when sitting, a frequency of: Avoid standing for long periods of time. WOUND #1: - Lower Leg Wound Laterality: Left, Anterior Cleanser: Soap and Water Discharge Instructions: May shower and wash wound with dial antibacterial soap and water prior to dressing change. Prim Dressing: KerraCel Ag Gelling Fiber Dressing, 4x5 in (silver alginate) ary Discharge Instructions: Apply silver alginate to wound bed as instructed Secondary Dressing: ABD Pad, 5x9 Discharge Instructions: Apply over primary dressing as directed. Com pression Wrap: Unnaboot w/Calamine, 4x10 (in/yd) Discharge Instructions: Apply Unnaboot as directed. WOUND #3R: - Lower Leg Wound Laterality: Right, Posterior Cleanser: Soap and Water Discharge Instructions: May shower and wash wound with dial antibacterial soap and water prior to dressing change. Prim Dressing: KerraCel Ag Gelling Fiber Dressing, 4x5 in (silver alginate) ary Discharge Instructions: Apply silver alginate to wound bed as instructed Secondary Dressing: ABD Pad, 5x9 Discharge Instructions: Apply over primary dressing as directed. Com pression Wrap: Unnaboot w/Calamine, 4x10 (in/yd) Discharge Instructions: Apply Unnaboot as directed. 1. We redressed are much the same as what we had done until last visit. This includes silver alginate under Unna boots bilaterally. According to the intake nurse she prefers Unna boots. 2. She has not had her compression stockings/o Juxta lites on for the last week. 3. I wonder about compression pumps in this patient but I did not actively explore this with her Electronic Signature(s) Signed: 08/04/2022 3:30:35 PM By: Linton Ham MD Entered By: Linton Ham on 08/04/2022 12:31:57 Jenetta Downer A ([id number]) [id number] Page 6 of  6 -------------------------------------------------------------------------------- SuperBill Details Patient Name: Date of Service: DERRICK, ORRIS A. 08/04/2022 Medical Record Number: [id number] Patient Account Number: [id number] Date of Birth/Sex: Treating RN: 11-May-1951 (71 y.o. F) Primary Care Provider: [name] Other Clinician: Referring Provider: Treating Provider/Extender: [name] in Treatment: 8 Diagnosis Coding ICD-10 Codes Code Description I89.0 Lymphedema, not elsewhere classified I87.331 Chronic venous hypertension (idiopathic) with ulcer and inflammation of right lower extremity L97.821 Non-pressure chronic ulcer of other part of left lower leg limited to breakdown of skin L97.812 Non-pressure chronic ulcer of other part of right lower leg with fat layer exposed E11.622 Type 2 diabetes mellitus with other skin ulcer I73.89 Other specified peripheral vascular diseases I10 Essential (primary) hypertension Z79.01 Long term (current) use of anticoagulants Physician Procedures : CPT4 Code Description Modifier [id number] - WC PHYS LEVEL 3 - EST PT ICD-10 Diagnosis Description L97.812 Non-pressure chronic ulcer of other part of right lower leg with fat layer exposed I89.0 Lymphedema, not elsewhere classified L97.821  Non-pressure chronic ulcer of other part of left lower leg limited to breakdown of skin Quantity: 1 Electronic Signature(s) Signed: 08/04/2022 3:30:35 PM By: Linton Ham MD Entered By: Linton Ham on 08/04/2022 12:33:51

## 2022-08-04 NOTE — Progress Notes (Signed)
Brandi, JIAN [name] ([id number]) [id number] Page 1 of 5 Visit Report for 08/04/2022 Arrival Information Details Patient Name: Date of Service: [name], [name]. 08/04/2022 11:00 [name] Medical Record Number: [id number] Patient Account Number: [id number] Date of Birth/Sex: Treating RN: 1951-02-06 (71 y.o. F) Primary Care [name]: [name] Other Clinician: Referring [name]: Treating [name]/Extender: [name] in Treatment: 8 Visit Information History Since Last Visit Added or deleted any medications: No Patient Arrived: Wheel Chair Any new allergies or adverse reactions: No Arrival Time: 11:37 Had [name] fall or experienced change in No Accompanied By: son activities of daily living that may affect Transfer Assistance: None risk of falls: Patient Identification Verified: Yes Signs or symptoms of abuse/neglect since last visito No Secondary Verification Process Completed: Yes Hospitalized since last visit: No Patient Requires Transmission-Based No Implantable device outside of the clinic excluding No Precautions: cellular tissue based products placed in the center Patient Has Alerts: Yes since last visit: Patient Alerts: VVS ABI [facility] [place] 10/23/21 Has Compression in Place as Prescribed: No TBI L 0.29 R 0.28 Pain Present Now: Yes Electronic Signature(s) Signed: 08/04/2022 3:44:50 PM By: Erenest Blank Entered By: Erenest Blank on 08/04/2022 11:37:47 -------------------------------------------------------------------------------- Lower Extremity Assessment Details Patient Name: Date of Service: Brandi Morgans [name]. 08/04/2022 11:00 [name] Medical Record Number: [id number] Patient Account Number: [id number] Date of Birth/Sex: Treating RN: 10/25/1950 (71 y.o. F) Primary Care [name]: [name] Other Clinician: Referring [name]: Treating [name]/Extender: [name] in  Treatment: 8 Edema Assessment Assessed: [Left: No] [Right: No] Edema: [Left: Yes] [Right: Yes] Calf Left: Right: Point of Measurement: 34.4 cm From Medial Instep 57.5 cm 58 cm Ankle Left: Right: Point of Measurement: 10 cm From Medial Instep 29.5 cm 27.5 cm Vascular Assessment Pulses: Dorsalis Pedis Palpable: [Left:Yes] [Right:Yes [id number] Page 2 of 5] Electronic Signature(s) Signed: 08/04/2022 3:44:50 PM By: Erenest Blank Entered By: Erenest Blank on 08/04/2022 11:47:47 -------------------------------------------------------------------------------- Multi-Disciplinary Care Plan Details Patient Name: Date of Service: Brandi Morgans [name]. 08/04/2022 11:00 [name] Medical Record Number: [id number] Patient Account Number: [id number] Date of Birth/Sex: Treating RN: 1951/08/19 (71 y.o. [name], [name] Primary Care [name]: [name] Other Clinician: Referring [name]: Treating [name]/Extender: [name] in Treatment: 8 Active Inactive Electronic Signature(s) Signed: 08/04/2022 3:55:23 PM By: Brandi Hammock RN Entered By: [name] on 08/04/2022 11:54:10 -------------------------------------------------------------------------------- Pain Assessment Details Patient Name: Date of Service: Brandi Morgans [name]. 08/04/2022 11:00 [name] Medical Record Number: [id number] Patient Account Number: [id number] Date of Birth/Sex: Treating RN: 06/29/1951 (71 y.o. F) Primary Care Granger Chui: [name] Other Clinician: Referring Paulanthony Gleaves: Treating Taje Tondreau/Extender: [name] in Treatment: 8 Active Problems Location of Pain Severity and Description of Pain Patient Has Paino Yes Site Locations Pain Location: Generalized Pain, Pain in Ulcers Rate the pain. Current Pain Level: 5 Character of Pain Describe the Pain: Burning Pain Management and Medication Current Pain  Management: MELLONY, DANZIGER [name] ([id number]) [id number] Page 3 of 5 Notes Worst at night Electronic Signature(s) Signed: 08/04/2022 3:44:50 PM By: Erenest Blank Entered By: Erenest Blank on 08/04/2022 11:38:36 -------------------------------------------------------------------------------- Patient/Caregiver Education Details Patient Name: Date of Service: Brandi Morgans [name]. 11/8/2023andnbsp11:00 [name] Medical Record Number: [id number] Patient Account Number: [id number] Date of Birth/Gender: Treating RN: 12/16/1950 (71 y.o. [name] Primary Care Physician: [name] Other Clinician: Referring Physician: Treating Physician/Extender: [name] in Treatment: 8 Education Assessment Education Provided To: Patient Education Topics  [name] Medical Record Number: [id number] Patient Account Number: [id number] Date of Birth/Sex: Treating RN: 1950-11-13 (71 y.o. F) Primary Care [name]: [name] Other Clinician: Referring [name]: Treating  [name]/Extender: [name] in Treatment: 8 Vital Signs Time Taken: 11:37 Temperature (F): 97.9 Height (in): 63 Pulse (bpm): 103 Weight (lbs): 284 Respiratory Rate (breaths/min): 18 Body Mass Index (BMI): 50.3 Blood Pressure (mmHg): 168/83 Reference Range: 80 - 120 mg / dl Electronic Signature(s) Signed: 08/04/2022 3:44:50 PM By: Erenest Blank Entered By: Erenest Blank on 08/04/2022 11:38:11  [name] Medical Record Number: [id number] Patient Account Number: [id number] Date of Birth/Sex: Treating RN: 1950-11-13 (71 y.o. F) Primary Care [name]: [name] Other Clinician: Referring [name]: Treating  [name]/Extender: [name] in Treatment: 8 Vital Signs Time Taken: 11:37 Temperature (F): 97.9 Height (in): 63 Pulse (bpm): 103 Weight (lbs): 284 Respiratory Rate (breaths/min): 18 Body Mass Index (BMI): 50.3 Blood Pressure (mmHg): 168/83 Reference Range: 80 - 120 mg / dl Electronic Signature(s) Signed: 08/04/2022 3:44:50 PM By: Erenest Blank Entered By: Erenest Blank on 08/04/2022 11:38:11

## 2022-08-09 ENCOUNTER — Telehealth: Payer: Self-pay | Admitting: Cardiovascular Disease

## 2022-08-09 NOTE — Telephone Encounter (Signed)
*  STAT* If patient is at the pharmacy, call can be transferred to refill team.   1. Which medications need to be refilled? (please list name of each medication and dose if known)  spironolactone (ALDACTONE) 25 MG tablet furosemide (LASIX) 40 MG tablet  2. Which pharmacy/location (including street and city if local pharmacy) is medication to be sent to? [facility] - [place], [place]   3. Do they need a 30 day or 90 day supply?  90 day supply

## 2022-08-10 MED ORDER — SPIRONOLACTONE 25 MG PO TABS: 25.0000 mg | ORAL_TABLET | ORAL | 3 refills | Status: DC

## 2022-08-10 MED ORDER — FUROSEMIDE 40 MG PO TABS: 40.0000 mg | ORAL_TABLET | ORAL | 3 refills | Status: DC

## 2022-08-10 NOTE — Telephone Encounter (Signed)
Refill sent to pharmacy.   

## 2022-08-11 ENCOUNTER — Encounter (HOSPITAL_BASED_OUTPATIENT_CLINIC_OR_DEPARTMENT_OTHER): Payer: HMO | Admitting: Physician Assistant

## 2022-08-11 DIAGNOSIS — E11622 Type 2 diabetes mellitus with other skin ulcer: Secondary | ICD-10-CM | POA: Diagnosis present

## 2022-08-11 DIAGNOSIS — L97812 Non-pressure chronic ulcer of other part of right lower leg with fat layer exposed: Secondary | ICD-10-CM | POA: Diagnosis not present

## 2022-08-11 DIAGNOSIS — E1151 Type 2 diabetes mellitus with diabetic peripheral angiopathy without gangrene: Secondary | ICD-10-CM | POA: Diagnosis not present

## 2022-08-11 DIAGNOSIS — I872 Venous insufficiency (chronic) (peripheral): Secondary | ICD-10-CM | POA: Diagnosis not present

## 2022-08-11 DIAGNOSIS — I890 Lymphedema, not elsewhere classified: Secondary | ICD-10-CM | POA: Diagnosis not present

## 2022-08-11 DIAGNOSIS — I10 Essential (primary) hypertension: Secondary | ICD-10-CM | POA: Diagnosis not present

## 2022-08-11 DIAGNOSIS — L97821 Non-pressure chronic ulcer of other part of left lower leg limited to breakdown of skin: Secondary | ICD-10-CM | POA: Diagnosis not present

## 2022-08-11 NOTE — Progress Notes (Signed)
[name] [name] ([id number]) [id number] Page 1 of 7 Visit Report for 08/11/2022 Chief Complaint Document Details Patient Name: Date of Service: [name], [name]. 08/11/2022 1:45 PM Medical Record Number: [id number] Patient Account Number: [id number] Date of Birth/Sex: Treating RN: 1951/01/25 (71 y.o. F) Primary Care Provider: [name] Other Clinician: Referring Provider: Treating Provider/Extender: [name], [name] in Treatment: 9 Information Obtained from: Patient Chief Complaint Right LE Ulcers Electronic Signature(s) Signed: 08/11/2022 2:17:21 PM By: Worthy Keeler PA-C Entered By: Worthy Keeler on 08/11/2022 14:17:21 -------------------------------------------------------------------------------- HPI Details Patient Name: Date of Service: [name]. 08/11/2022 1:45 PM Medical Record Number: [id number] Patient Account Number: [id number] Date of Birth/Sex: Treating RN: 07/02/1951 (71 y.o. F) Primary Care Provider: [name] Other Clinician: Referring Provider: Treating Provider/Extender: [name], [name] in Treatment: 9 History of Present Illness HPI Description: 08/05/2021 upon evaluation today patient has bilateral wounds on her lower extremities. She has been tolerating the dressing changes without complication at home but unfortunately is not really seeing signs of improvement like she would like to see. She does have what appears to be bilateral lymphedema due to chronic venous insufficiency most likely. She is on fluid pills and takes them regularly. She really does not sit with her legs elevated much she has [name] recliner but again does not use that often. I think she should be using this more to get her feet up. Subsequently she tells me this has been going on for several weeks to even months. She finally decided to make [name] referral herself to come in to be  seen. Patient does have [name] history of diabetes mellitus type 2, some evidence of potential peripheral vascular disease as she is noncompressible with her ABIs in the office today, hypertension, and she is on long-term anticoagulant therapy. 08/12/2021 upon evaluation today patient's wounds appear to be doing well in regard to her legs currently. Actually very pleased with where things stand and I do not see any signs of active infection which is great news. No fevers, chills, nausea, vomiting, or diarrhea. 08/19/2021 upon evaluation today patient appears to be doing well with regard to her legs. Everything actually appears to be healing quite nicely with doing extremely well. Fortunately there does not appear to be any signs of active infection which is great news. Readmission: 06-09-2022 upon evaluation today patient appears for reevaluation in the clinic concerning issues that she has been having with her leg. This is actually her right leg at this time though when I saw her previously was both legs. Fortunately there does not appear to be any evidence of active infection at this time which is great news. No fevers, chills, nausea, vomiting, or diarrhea. Her past medical history really has not changed significantly since last time I saw her. I did discuss with her today how she has been doing with wearing her compression socks she tells me she really has not been wearing those because "she cannot get them on her son is not able to help. She tells me that he works at night and by the time he comes and goes to bed she is not up yet and then she wakes up later in the past do not cross. I talked her today about actually having [name] time later in the day when he could help with getting the socks on and that is [name] possibility. With that being said she currently states that she had not thought through  that completely I am not sure if that something she has been worked out or not. 06-16-2022 upon evaluation today  patient appears to be doing well currently in regard to her wounds all things considered. I am seeing excellent signs of improvement I think were getting very close to complete resolution which is great news and overall I do not see anything that seems to be worsening significantly also great news. In general I think we are on the right track here. She does have [name] follow-up with vein and vascular next Friday. 06-23-2022 upon evaluation today the wound on patient's right leg actually appears to be healed unfortunately she has an area on the left leg which is still open. [name], [name] ([id number]) [id number] Page 2 of 7 I am thinking that this may be brand-new skin on the right leg so I think we probably should still continue to wrap her for at least 1 more week and I discussed that with her today. Otherwise I think that we are on the right track for the most part. 07-07-2022 upon evaluation today patient appears to be doing well currently in regard to her wound she actually appears to be healed which is good news. Fortunately I do not see any signs of infection. The downside is that she actually did have her arterial studies that showed that on 06-25-2022 that her TBI on the right was 0.30 and on the left was 0.25. This is obviously very low and I think that that is going to continue to be an ongoing issue and something has to be monitored for her. I discussed that with the patient today. I did review Dr. Unk Lightning note had vascular surgery as well and he noted that to be honest right now being that she is not [name] great surgical candidate he would not want to proceed with any further intervention though she does not feel her stay healed then the neck step would be to do endovascular intervention. 10/18; the patient comes in without her juxta lite stockings on since yesterday. She said they were too tight and were cutting into her upper posterior calf. She took them off but  did not bring them in today. She does not have an open wound however her edema control is very poor and she is certainly at risk for reopening 11/8; patient was discharged 2 weeks ago in bilateral juxta lite stockings. Apparently she noted drainage coming out of both her legs about [name] week ago she has not been wearing the stockings since. She has reopening of the area on the right posterior calf and 2 small areas anteriorly and laterally on the left. Most substantial areas on the right posterior calf 08-11-2022 upon evaluation today patient appears to be doing well currently in regard to her wound. She has been improving since last week. The good news is she does not show any signs of active infection at this time which is excellent. She does seem to be doing better especially in regard to the left leg which is completely healed the right leg unfortunately is still open and this is still giving her trouble. Electronic Signature(s) Signed: 08/11/2022 2:40:03 PM By: Worthy Keeler PA-C Entered By: Worthy Keeler on 08/11/2022 14:40:02 -------------------------------------------------------------------------------- Physical Exam Details Patient Name: Date of Service: [name], [name]. 08/11/2022 1:45 PM Medical Record Number: [id number] Patient Account Number: [id number] Date of Birth/Sex: Treating RN: Apr 28, 1951 (71 y.o. F) Primary Care Provider: [name] Other Clinician: Referring Provider: Treating Provider/Extender:  Stone III, Boyce Medici, [name] in Treatment: 9 Constitutional Well-nourished and well-hydrated in no acute distress. Respiratory normal breathing without difficulty. Psychiatric this patient is able to make decisions and demonstrates good insight into disease process. Alert and Oriented x 3. pleasant and cooperative. Notes Upon inspection patient's wound bed appears to be doing about the same in regard to right leg which is still open but does seem  to be showing signs of improvement. The left leg is completely closed and looks to be doing excellent at this point. I am very pleased in that regard. Electronic Signature(s) Signed: 08/11/2022 2:40:17 PM By: Worthy Keeler PA-C Entered By: Worthy Keeler on 08/11/2022 14:40:17 -------------------------------------------------------------------------------- Physician Orders Details Patient Name: Date of Service: [name]. 08/11/2022 1:45 PM Medical Record Number: [id number] Patient Account Number: [id number] Date of Birth/Sex: Treating RN: [birth year] (71 y.o. [name] Primary Care Provider: [name] Other Clinician: Referring Provider: Treating Provider/Extender: Angelica Pou in Treatment: 9 Verbal / Phone Orders: No [name], [name] ([id number]) [id number] Page 3 of 7 Diagnosis Coding ICD-10 Coding Code Description I89.0 Lymphedema, not elsewhere classified I87.331 Chronic venous hypertension (idiopathic) with ulcer and inflammation of right lower extremity L97.821 Non-pressure chronic ulcer of other part of left lower leg limited to breakdown of skin L97.812 Non-pressure chronic ulcer of other part of right lower leg with fat layer exposed E11.622 Type 2 diabetes mellitus with other skin ulcer I73.89 Other specified peripheral vascular diseases I10 Essential (primary) hypertension Z79.01 Long term (current) use of anticoagulants Follow-up Appointments ppointment in 2 weeks. Margarita Grizzle Wednesday. Return [name] Nurse Visit: - Wednesday 08/18/2022 Other: - [facility] 09/10/2022 appt time. Bathing/ Shower/ Hygiene May shower with protection but do not get wound dressing(s) wet. Edema Control - Lymphedema / SCD / Other Elevate legs to the level of the heart or above for 30 minutes daily and/or when sitting, [name] frequency of: Avoid standing for long periods of time. Other Edema Control Orders/Instructions: -  unna boot to left leg. Wound Treatment Wound #3R - Lower Leg Wound Laterality: Right, Posterior Cleanser: Soap and Water 1 x Per Week/30 Days Discharge Instructions: May shower and wash wound with dial antibacterial soap and water prior to dressing change. Prim Dressing: KerraCel Ag Gelling Fiber Dressing, 4x5 in (silver alginate) 1 x Per Week/30 Days ary Discharge Instructions: Apply silver alginate to wound bed as instructed Secondary Dressing: ABD Pad, 5x9 1 x Per Week/30 Days Discharge Instructions: Apply over primary dressing as directed. Compression Wrap: Unnaboot w/Calamine, 4x10 (in/yd) 1 x Per Week/30 Days Discharge Instructions: Apply Unnaboot as directed. Electronic Signature(s) Unsigned Entered By: Deon Pilling on 08/11/2022 14:33:28 -------------------------------------------------------------------------------- Problem List Details Patient Name: Date of Service: [name], [name]. 08/11/2022 1:45 PM Medical Record Number: [id number] Patient Account Number: [id number] Date of Birth/Sex: Treating RN: 08/14/1951 (71 y.o. F) Primary Care Provider: [name] Other Clinician: Referring Provider: Treating Provider/Extender: [name], [name] in Treatment: 9 Active Problems ICD-10 Encounter Code Description Active Date MDM Diagnosis I89.0 Lymphedema, not elsewhere classified 06/09/2022 No Yes MARIXA, MELLOTT [name] ([id number]) [phone].pdf Page 4 of 7 I87.331 Chronic venous hypertension (idiopathic) with ulcer and inflammation of right 06/09/2022 No Yes lower extremity L97.821 Non-pressure chronic ulcer of other part of left lower leg limited to breakdown 08/04/2022 No Yes of skin L97.812 Non-pressure chronic ulcer of other part of right lower leg with fat layer 06/09/2022 No Yes exposed E11.622 Type 2 diabetes mellitus with  other skin ulcer 06/09/2022 No Yes I73.89 Other specified peripheral vascular diseases  06/09/2022 No Yes I10 Essential (primary) hypertension 06/09/2022 No Yes Z79.01 Long term (current) use of anticoagulants 06/09/2022 No Yes Inactive Problems Resolved Problems Electronic Signature(s) Signed: 08/11/2022 2:04:09 PM By: Worthy Keeler PA-C Entered By: Worthy Keeler on 08/11/2022 14:04:08 -------------------------------------------------------------------------------- Progress Note Details Patient Name: Date of Service: [name]. 08/11/2022 1:45 PM Medical Record Number: [id number] Patient Account Number: [id number] Date of Birth/Sex: Treating RN: [birth year] (71 y.o. F) Primary Care Provider: [name] Other Clinician: Referring Provider: Treating Provider/Extender: Angelica Pou in Treatment: 9 Subjective Chief Complaint Information obtained from Patient Right LE Ulcers History of Present Illness (HPI) 08/05/2021 upon evaluation today patient has bilateral wounds on her lower extremities. She has been tolerating the dressing changes without complication at home but unfortunately is not really seeing signs of improvement like she would like to see. She does have what appears to be bilateral lymphedema due to chronic venous insufficiency most likely. She is on fluid pills and takes them regularly. She really does not sit with her legs elevated much she has [name] recliner but again does not use that often. I think she should be using this more to get her feet up. Subsequently she tells me this has been going on for several weeks to even months. She finally decided to make [name] referral herself to come in to be seen. Patient does have [name] history of diabetes mellitus type 2, some evidence of potential peripheral vascular disease as she is noncompressible with her ABIs in the office today, hypertension, and she is on long-term anticoagulant therapy. 08/12/2021 upon evaluation today patient's wounds appear to be doing well in regard to her  legs currently. Actually very pleased with where things stand and I do not see any signs of active infection which is great news. No fevers, chills, nausea, vomiting, or diarrhea. [name], [name] ([id number]) [id number] Page 5 of 7 08/19/2021 upon evaluation today patient appears to be doing well with regard to her legs. Everything actually appears to be healing quite nicely with doing extremely well. Fortunately there does not appear to be any signs of active infection which is great news. Readmission: 06-09-2022 upon evaluation today patient appears for reevaluation in the clinic concerning issues that she has been having with her leg. This is actually her right leg at this time though when I saw her previously was both legs. Fortunately there does not appear to be any evidence of active infection at this time which is great news. No fevers, chills, nausea, vomiting, or diarrhea. Her past medical history really has not changed significantly since last time I saw her. I did discuss with her today how she has been doing with wearing her compression socks she tells me she really has not been wearing those because "she cannot get them on her son is not able to help. She tells me that he works at night and by the time he comes and goes to bed she is not up yet and then she wakes up later in the past do not cross. I talked her today about actually having [name] time later in the day when he could help with getting the socks on and that is [name] possibility. With that being said she currently states that she had not thought through that completely I am not sure if that something she has been worked out or not. 06-16-2022 upon evaluation today  patient appears to be doing well currently in regard to her wounds all things considered. I am seeing excellent signs of improvement I think were getting very close to complete resolution which is great news and overall I do not see anything that  seems to be worsening significantly also great news. In general I think we are on the right track here. She does have [name] follow-up with vein and vascular next Friday. 06-23-2022 upon evaluation today the wound on patient's right leg actually appears to be healed unfortunately she has an area on the left leg which is still open. I am thinking that this may be brand-new skin on the right leg so I think we probably should still continue to wrap her for at least 1 more week and I discussed that with her today. Otherwise I think that we are on the right track for the most part. 07-07-2022 upon evaluation today patient appears to be doing well currently in regard to her wound she actually appears to be healed which is good news. Fortunately I do not see any signs of infection. The downside is that she actually did have her arterial studies that showed that on 06-25-2022 that her TBI on the right was 0.30 and on the left was 0.25. This is obviously very low and I think that that is going to continue to be an ongoing issue and something has to be monitored for her. I discussed that with the patient today. I did review Dr. Unk Lightning note had vascular surgery as well and he noted that to be honest right now being that she is not [name] great surgical candidate he would not want to proceed with any further intervention though she does not feel her stay healed then the neck step would be to do endovascular intervention. 10/18; the patient comes in without her juxta lite stockings on since yesterday. She said they were too tight and were cutting into her upper posterior calf. She took them off but did not bring them in today. She does not have an open wound however her edema control is very poor and she is certainly at risk for reopening 11/8; patient was discharged 2 weeks ago in bilateral juxta lite stockings. Apparently she noted drainage coming out of both her legs about [name] week ago she has not been wearing the stockings  since. She has reopening of the area on the right posterior calf and 2 small areas anteriorly and laterally on the left. Most substantial areas on the right posterior calf 08-11-2022 upon evaluation today patient appears to be doing well currently in regard to her wound. She has been improving since last week. The good news is she does not show any signs of active infection at this time which is excellent. She does seem to be doing better especially in regard to the left leg which is completely healed the right leg unfortunately is still open and this is still giving her trouble. Objective Constitutional Well-nourished and well-hydrated in no acute distress. Vitals Time Taken: 2:00 PM, Height: 63 in, Weight: 284 lbs, BMI: 50.3, Temperature: 98.3 F, Pulse: 74 bpm, Respiratory Rate: 20 breaths/min, Blood Pressure: 149/85 mmHg. Respiratory normal breathing without difficulty. Psychiatric this patient is able to make decisions and demonstrates good insight into disease process. Alert and Oriented x 3. pleasant and cooperative. General Notes: Upon inspection patient's wound bed appears to be doing about the same in regard to right leg which is still open but does seem to be showing  signs of improvement. The left leg is completely closed and looks to be doing excellent at this point. I am very pleased in that regard. Integumentary (Hair, Skin) Wound #1 status is Open. Original cause of wound was Blister. The date acquired was: 06/23/2021. The wound has been in treatment 53 weeks. The wound is located on the Left,Anterior Lower Leg. The wound measures 0cm length x 0cm width x 0cm depth; 0cm^2 area and 0cm^3 volume. There is no tunneling or undermining noted. There is [name] none present amount of drainage noted. The wound margin is distinct with the outline attached to the wound base. There is no granulation within the wound bed. There is no necrotic tissue within the wound bed. The periwound skin  appearance exhibited: Dry/Scaly. The periwound skin appearance did not exhibit: Callus, Crepitus, Excoriation, Induration, Rash, Scarring, Maceration, Atrophie Blanche, Cyanosis, Ecchymosis, Hemosiderin Staining, Mottled, Pallor, Rubor, Erythema. Wound #3R status is Open. Original cause of wound was Blister. The date acquired was: 05/26/2022. The wound has been in treatment 9 weeks. The wound is located on the Right,Posterior Lower Leg. The wound measures 3.5cm length x 4cm width x 0.1cm depth; 10.996cm^2 area and 1.1cm^3 volume. There is Fat Layer (Subcutaneous Tissue) exposed. There is no tunneling or undermining noted. There is [name] medium amount of serosanguineous drainage noted. The wound margin is distinct with the outline attached to the wound base. There is medium (34-66%) pink granulation within the wound bed. There is [name] medium (34-66%) amount of necrotic tissue within the wound bed including Adherent Slough. The periwound skin appearance exhibited: Maceration. The periwound skin appearance did not exhibit: Callus, Crepitus, Excoriation, Induration, Rash, Scarring, Dry/Scaly, Atrophie Blanche, Cyanosis, Ecchymosis, Hemosiderin Staining, Mottled, Pallor, Rubor, Erythema. Other Condition(s) Patient presents with Lymphedema located on the Left Leg. The skin appearance exhibited: Hemosiderin Staining, Scarring. The skin appearance did not exhibit: Atrophie Blanche, Callus, Crepitus, Cyanosis, Dry/Scaly, Ecchymosis, Erythema, Excoriation, Friable, Induration, Maceration, Mottled, Pallor, Rash, Rubor. General Notes: cobblestone appearance. [name], [name] ([id number]) [id number] Page 6 of 7 Assessment Active Problems ICD-10 Lymphedema, not elsewhere classified Chronic venous hypertension (idiopathic) with ulcer and inflammation of right lower extremity Non-pressure chronic ulcer of other part of left lower leg limited to breakdown of skin Non-pressure chronic  ulcer of other part of right lower leg with fat layer exposed Type 2 diabetes mellitus with other skin ulcer Other specified peripheral vascular diseases Essential (primary) hypertension Long term (current) use of anticoagulants Procedures Wound #3R Pre-procedure diagnosis of Wound #3R is [name] Lymphedema located on the Right,Posterior Lower Leg . There was [name] Haematologist Compression Therapy Procedure by Deon Pilling, RN. Post procedure Diagnosis Wound #3R: Same as Pre-Procedure There was [name] Haematologist Compression Therapy Procedure by Deon Pilling, RN. Post procedure Diagnosis Wound #: Same as Pre-Procedure Plan Follow-up Appointments: Return Appointment in 2 weeks. Margarita Grizzle Wednesday. Nurse Visit: - Wednesday 08/18/2022 Other: - [facility] 09/10/2022 appt time. Bathing/ Shower/ Hygiene: May shower with protection but do not get wound dressing(s) wet. Edema Control - Lymphedema / SCD / Other: Elevate legs to the level of the heart or above for 30 minutes daily and/or when sitting, [name] frequency of: Avoid standing for long periods of time. Other Edema Control Orders/Instructions: - unna boot to left leg. WOUND #3R: - Lower Leg Wound Laterality: Right, Posterior Cleanser: Soap and Water 1 x Per Week/30 Days Discharge Instructions: May shower and wash wound with dial antibacterial soap and water prior to dressing change. Prim Dressing: KerraCel Ag Gelling Fiber  Dressing, 4x5 in (silver alginate) 1 x Per Week/30 Days ary Discharge Instructions: Apply silver alginate to wound bed as instructed Secondary Dressing: ABD Pad, 5x9 1 x Per Week/30 Days Discharge Instructions: Apply over primary dressing as directed. Com pression Wrap: Unnaboot w/Calamine, 4x10 (in/yd) 1 x Per Week/30 Days Discharge Instructions: Apply Unnaboot as directed. 1. I am going to suggest that we continue with the Unna boot wraps as the patient appears to be doing excellent at this time. 2. I am also can recommend that we have the  patient continue with the silver alginate dressing to the open wound on the right leg the left leg were wrapped and just for protection sake at this point. 3. I am also going to suggest she bring her juxta lite compression wraps with her next week. Obviously I do believe that she could be [name] candidate to get into that at this point and we will see how things go. We will see patient back for reevaluation in 1 week here in the clinic. If anything worsens or changes patient will contact our office for additional recommendations. Electronic Signature(s) Signed: 08/11/2022 2:48:18 PM By: Worthy Keeler PA-C Entered By: Worthy Keeler on 08/11/2022 14:48:17 [name] ([id number]) [id number] Page 7 of 7 -------------------------------------------------------------------------------- SuperBill Details Patient Name: Date of Service: [name], [name]. 08/11/2022 Medical Record Number: [id number] Patient Account Number: [id number] Date of Birth/Sex: Treating RN: [birth year] (71 y.o. 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

## 2022-08-12 NOTE — Progress Notes (Signed)
Right: Yes] [name] ([id number]) [phone].pdf Page 3 of 8 Left: Right: Point of Measurement: 34.4 cm From Medial Instep 51 cm 57 cm Ankle Left: Right: Point of Measurement: 10 cm From Medial Instep 29 cm 27 cm Electronic Signature(s) Signed: 08/11/2022 5:03:28 PM By: Deon Pilling RN, BSN Entered By: Deon Pilling on 08/11/2022 14:08:52 -------------------------------------------------------------------------------- Brandi Details Patient Name: Date of Service: [name]. 08/11/2022 1:45 PM Medical Record Number: [id number] Patient Account Number: [id number] Date of Birth/Sex: Treating RN: Apr 14, 1951 (71 y.o. [name] Primary Care Alando Colleran: [name] Other Clinician: Referring Treg Diemer: Treating Yaileen Hofferber/Extender: Angelica Pou in Treatment: 9 Active Inactive Venous Leg Ulcer Nursing Diagnoses: Actual venous Insuffiency (use after diagnosis is confirmed) Goals: Patient will maintain optimal edema  control Date Initiated: 08/11/2022 Target Resolution Date: 10/01/2022 Goal Status: Active Interventions: Assess peripheral edema status every visit. Compression as ordered Provide education on venous insufficiency Treatment Activities: Therapeutic compression applied : 08/11/2022 Notes: Electronic Signature(s) Signed: 08/11/2022 5:03:28 PM By: Deon Pilling RN, BSN Entered By: Deon Pilling on 08/11/2022 14:28:53 -------------------------------------------------------------------------------- Non-Wound Condition Assessment Details Patient Name: Date of Service: [name]. 08/11/2022 1:45 PM Medical Record Number: [id number] Patient Account Number: [id number] Date of Birth/Sex: Treating RN: November 28, 1950 (71 y.o. [name] Primary Care Jerrard Bradburn: [name] Other Clinician: Referring Aneli Brandi: Treating Ricky Doan/Extender: [name], [name] in Treatment: 9 Non-Wound Condition: [name] ([id number]) [id number] Page 4 of 8 Condition: Lymphedema Location: Leg Side: Left Periwound Skin Texture Texture Color No Abnormalities Noted: No No Abnormalities Noted: No Callus: No Atrophie Blanche: No Crepitus: No Cyanosis: No Excoriation: No Ecchymosis: No Friable: No Erythema: No Induration: No Hemosiderin Staining: Yes Rash: No Mottled: No Scarring: Yes Pallor: No Rubor: No Moisture No Abnormalities Noted: No Dry / Scaly: No Maceration: No Notes cobblestone appearance. Electronic Signature(s) Signed: 08/11/2022 5:03:28 PM By: Deon Pilling RN, BSN Entered By: Deon Pilling on 08/11/2022 14:32:45 -------------------------------------------------------------------------------- Pain Assessment Details Patient Name: Date of Service: [name]. 08/11/2022 1:45 PM Medical Record Number: [id number] Patient Account Number: [id number] Date of Birth/Sex: Treating RN: 02-Jul-1951 (71 y.o.  [name] Primary Care Oviya Ammar: [name] Other Clinician: Referring Tia Hieronymus: Treating Reshard Guillet/Extender: Angelica Pou in Treatment: 9 Active Problems Location of Pain Severity and Description of Pain Patient Has Paino Yes Site Locations Pain Location: Generalized Pain, Pain in Ulcers Rate the pain. Current Pain Level: 5 Character of Pain Describe the Pain: Burning Pain Management and Medication Current Pain Management: Medication: No Cold Application: No Rest: No Massage: No Activity: No T.E.N.S.: No [name] ([id number]) [phone].pdf Page 5 of 8 Heat Application: No Leg drop or elevation: No Is the Current Pain Management Adequate: Adequate How does your wound impact your activities of daily livingo Sleep: No Bathing: No Appetite: No Relationship With Others: No Bladder Continence: No Emotions: No Bowel Continence: No Work: No Toileting: No Drive: No Dressing: No Hobbies: No Notes burning to both legs. Electronic Signature(s) Signed: 08/11/2022 5:03:28 PM By: Deon Pilling RN, BSN Entered By: Deon Pilling on 08/11/2022 14:03:28 -------------------------------------------------------------------------------- Patient/Caregiver Education Details Patient Name: Date of Service: [name] 11/15/2023andnbsp1:45 PM Medical Record Number: [id number] Patient Account Number: [id number] Date of Birth/Gender: Treating RN: 1951/03/24 (71 y.o. [name] Primary Care Physician: [name] Other Clinician: Referring Physician: Treating Physician/Extender: Angelica Pou in Treatment: 9 Education Assessment Education Provided To:  Patient Education Topics Provided Venous: Handouts: Controlling Swelling with Multilayered Compression Wraps Methods: Explain/Verbal Responses: Reinforcements needed Electronic Signature(s) Signed:  08/11/2022 5:03:28 PM By: Deon Pilling RN, BSN Entered By: Deon Pilling on 08/11/2022 14:29:10 -------------------------------------------------------------------------------- Wound Assessment Details Patient Name: Date of Service: [name]. 08/11/2022 1:45 PM Medical Record Number: [id number] Patient Account Number: [id number] Date of Birth/Sex: Treating RN: January 11, 1951 (71 y.o. [name] Primary Care Riordan Walle: [name] Other Clinician: Referring Clemencia Helzer: Treating Marcelino Campos/Extender: [name], [name] in Treatment: 9 Wound Status Wound Number: 1 Primary Lymphedema Etiology: Wound Location: Left, Anterior Lower Leg Wound Open Wounding Event: Blister Status: [name] ([id number]) [id number].pdf Page 6 of 8 Status: Date Acquired: 06/23/2021 Comorbid Cataracts, Sleep Apnea, Arrhythmia, Coronary Artery Disease, Weeks Of Treatment: 53 History: Hypertension, Peripheral Venous Disease, Type II Diabetes, Clustered Wound: No Osteoarthritis, Neuropathy Photos Wound Measurements Length: (cm) Width: (cm) Depth: (cm) Area: (cm) Volume: (cm) 0 % Reduction in Area: 100% 0 % Reduction in Volume: 100% 0 Epithelialization: Large (67-100%) 0 Tunneling: No 0 Undermining: No Wound Description Classification: Full Thickness Without Exposed Support S Wound Margin: Distinct, outline attached Exudate Amount: None Present tructures Foul Odor After Cleansing: No Slough/Fibrino No Wound Bed Granulation Amount: None Present (0%) Exposed Structure Necrotic Amount: None Present (0%) Fascia Exposed: No Fat Layer (Subcutaneous Tissue) Exposed: No Tendon Exposed: No Muscle Exposed: No Joint Exposed: No Bone Exposed: No Periwound Skin Texture Texture Color No Abnormalities Noted: No No Abnormalities Noted: No Callus: No Atrophie Blanche: No Crepitus: No Cyanosis: No Excoriation: No Ecchymosis:  No Induration: No Erythema: No Rash: No Hemosiderin Staining: No Scarring: No Mottled: No Pallor: No Moisture Rubor: No No Abnormalities Noted: No Dry / Scaly: Yes Maceration: No Electronic Signature(s) Signed: 08/11/2022 5:03:28 PM By: Deon Pilling RN, BSN Entered By: Deon Pilling on 08/11/2022 14:13:11 -------------------------------------------------------------------------------- Wound Assessment Details Patient Name: Date of Service: [name]. 08/11/2022 1:45 PM Medical Record Number: [id number] Patient Account Number: [id number] Date of Birth/Sex: Treating RN: March 30, 1951 (71 y.o. [name] Primary Care Royelle Hinchman: [name] Other Clinician: Referring Roschelle Calandra: Treating Athene Schuhmacher/Extender: [name], [name] in Treatment: [address], [place] [name] ([id number]) [id number].pdf Page 7 of 8 Wound Status Wound Number: 3R Primary Lymphedema Etiology: Wound Location: Right, Posterior Lower Leg Wound Open Wounding Event: Blister Status: Date Acquired: 05/26/2022 Comorbid Cataracts, Sleep Apnea, Arrhythmia, Coronary Artery Disease, Weeks Of Treatment: 9 History: Hypertension, Peripheral Venous Disease, Type II Diabetes, Clustered Wound: No Osteoarthritis, Neuropathy Photos Wound Measurements Length: (cm) 3.5 Width: (cm) 4 Depth: (cm) 0.1 Clustered Quantity: 1 Area: (cm) 10.996 Volume: (cm) 1.1 % Reduction in Area: -72.8% % Reduction in Volume: -73% Epithelialization: Small (1-33%) Tunneling: No Undermining: No Wound Description Classification: Full Thickness Without Exposed Sup Wound Margin: Distinct, outline attached Exudate Amount: Medium Exudate Type: Serosanguineous Exudate Color: red, brown port Structures Foul Odor After Cleansing: No Slough/Fibrino Yes Wound Bed Granulation Amount: Medium (34-66%) Exposed Structure Granulation Quality: Pink Fascia Exposed: No Necrotic Amount:  Medium (34-66%) Fat Layer (Subcutaneous Tissue) Exposed: Yes Necrotic Quality: Adherent Slough Tendon Exposed: No Muscle Exposed: No Joint Exposed: No Bone Exposed: No Periwound Skin Texture Texture Color No Abnormalities Noted: No No Abnormalities Noted: No Callus: No Atrophie Blanche: No Crepitus: No Cyanosis: No Excoriation: No Ecchymosis: No Induration: No Erythema: No Rash: No Hemosiderin Staining: No Scarring: No Mottled: No Pallor: No Moisture Rubor: No No Abnormalities Noted: No Dry / Scaly: No Maceration: Yes Treatment Notes Wound #  Right: Yes] [name] ([id number]) [phone].pdf Page 3 of 8 Left: Right: Point of Measurement: 34.4 cm From Medial Instep 51 cm 57 cm Ankle Left: Right: Point of Measurement: 10 cm From Medial Instep 29 cm 27 cm Electronic Signature(s) Signed: 08/11/2022 5:03:28 PM By: Deon Pilling RN, BSN Entered By: Deon Pilling on 08/11/2022 14:08:52 -------------------------------------------------------------------------------- Brandi Details Patient Name: Date of Service: [name]. 08/11/2022 1:45 PM Medical Record Number: [id number] Patient Account Number: [id number] Date of Birth/Sex: Treating RN: Apr 14, 1951 (71 y.o. [name] Primary Care Alando Colleran: [name] Other Clinician: Referring Treg Diemer: Treating Yaileen Hofferber/Extender: Angelica Pou in Treatment: 9 Active Inactive Venous Leg Ulcer Nursing Diagnoses: Actual venous Insuffiency (use after diagnosis is confirmed) Goals: Patient will maintain optimal edema  control Date Initiated: 08/11/2022 Target Resolution Date: 10/01/2022 Goal Status: Active Interventions: Assess peripheral edema status every visit. Compression as ordered Provide education on venous insufficiency Treatment Activities: Therapeutic compression applied : 08/11/2022 Notes: Electronic Signature(s) Signed: 08/11/2022 5:03:28 PM By: Deon Pilling RN, BSN Entered By: Deon Pilling on 08/11/2022 14:28:53 -------------------------------------------------------------------------------- Non-Wound Condition Assessment Details Patient Name: Date of Service: [name]. 08/11/2022 1:45 PM Medical Record Number: [id number] Patient Account Number: [id number] Date of Birth/Sex: Treating RN: November 28, 1950 (71 y.o. [name] Primary Care Jerrard Bradburn: [name] Other Clinician: Referring Aneli Brandi: Treating Ricky Doan/Extender: [name], [name] in Treatment: 9 Non-Wound Condition: [name] ([id number]) [id number] Page 4 of 8 Condition: Lymphedema Location: Leg Side: Left Periwound Skin Texture Texture Color No Abnormalities Noted: No No Abnormalities Noted: No Callus: No Atrophie Blanche: No Crepitus: No Cyanosis: No Excoriation: No Ecchymosis: No Friable: No Erythema: No Induration: No Hemosiderin Staining: Yes Rash: No Mottled: No Scarring: Yes Pallor: No Rubor: No Moisture No Abnormalities Noted: No Dry / Scaly: No Maceration: No Notes cobblestone appearance. Electronic Signature(s) Signed: 08/11/2022 5:03:28 PM By: Deon Pilling RN, BSN Entered By: Deon Pilling on 08/11/2022 14:32:45 -------------------------------------------------------------------------------- Pain Assessment Details Patient Name: Date of Service: [name]. 08/11/2022 1:45 PM Medical Record Number: [id number] Patient Account Number: [id number] Date of Birth/Sex: Treating RN: 02-Jul-1951 (71 y.o.  [name] Primary Care Oviya Ammar: [name] Other Clinician: Referring Tia Hieronymus: Treating Reshard Guillet/Extender: Angelica Pou in Treatment: 9 Active Problems Location of Pain Severity and Description of Pain Patient Has Paino Yes Site Locations Pain Location: Generalized Pain, Pain in Ulcers Rate the pain. Current Pain Level: 5 Character of Pain Describe the Pain: Burning Pain Management and Medication Current Pain Management: Medication: No Cold Application: No Rest: No Massage: No Activity: No T.E.N.S.: No [name] ([id number]) [phone].pdf Page 5 of 8 Heat Application: No Leg drop or elevation: No Is the Current Pain Management Adequate: Adequate How does your wound impact your activities of daily livingo Sleep: No Bathing: No Appetite: No Relationship With Others: No Bladder Continence: No Emotions: No Bowel Continence: No Work: No Toileting: No Drive: No Dressing: No Hobbies: No Notes burning to both legs. Electronic Signature(s) Signed: 08/11/2022 5:03:28 PM By: Deon Pilling RN, BSN Entered By: Deon Pilling on 08/11/2022 14:03:28 -------------------------------------------------------------------------------- Patient/Caregiver Education Details Patient Name: Date of Service: [name] 11/15/2023andnbsp1:45 PM Medical Record Number: [id number] Patient Account Number: [id number] Date of Birth/Gender: Treating RN: 1951/03/24 (71 y.o. [name] Primary Care Physician: [name] Other Clinician: Referring Physician: Treating Physician/Extender: Angelica Pou in Treatment: 9 Education Assessment Education Provided To:  [name] ([id number]) [id number] Page 1 of 8 Visit Report for 08/11/2022 Arrival Information Details Patient Name: Date of Service: [name]. 08/11/2022 1:45 PM Medical Record Number: [id number] Patient Account Number: [id number] Date of Birth/Sex: Treating RN: 1951/09/07 (71 y.o. [name], [name] Primary Care Zianne Schubring: [name] Other Clinician: Referring Jemeka Wagler: Treating Mahala Rommel/Extender: Angelica Pou in Treatment: 9 Visit Information History Since Last Visit Added or deleted any medications: No Patient Arrived: Wheel Chair Any new allergies or adverse reactions: No Arrival Time: 14:02 Had [name] fall or experienced change in No Accompanied By: self activities of daily living that may affect Transfer Assistance: Manual risk of falls: Patient Identification Verified: Yes Signs or symptoms of abuse/neglect since last visito No Secondary Verification Process Completed: Yes Hospitalized since last visit: No Patient Requires Transmission-Based No Implantable device outside of the clinic excluding No Precautions: cellular tissue based products placed in the center Patient Has Alerts: Yes since last visit: Patient Alerts: VVS ABI [place] 10/23/21 Has Dressing in Place as Prescribed: Yes TBI L 0.29 R 0.28 Has Compression in Place as Prescribed: Yes Pain Present Now: Yes Electronic Signature(s) Signed: 08/11/2022 5:03:28 PM By: Deon Pilling RN, BSN Entered By: Deon Pilling on 08/11/2022 14:03:07 -------------------------------------------------------------------------------- Compression Therapy Details Patient Name: Date of Service: [name]. 08/11/2022 1:45 PM Medical Record Number: [id number] Patient Account Number: [id number] Date of Birth/Sex: Treating RN: Apr 04, 1951 (72 y.o. [name] Primary Care Marina Desire: [name] Other Clinician: Referring  [name]: Treating Nasha Diss/Extender: Angelica Pou in Treatment: 9 Compression Therapy Performed for Wound Assessment: Wound #3R Right,Posterior Lower Leg Performed By: Clinician Deon Pilling, RN Compression Type: Rolena Infante Post Procedure Diagnosis Same as Pre-procedure Electronic Signature(s) Signed: 08/11/2022 5:03:28 PM By: Deon Pilling RN, BSN Entered By: Deon Pilling on 08/11/2022 14:30:25 [name] ([id number]) [id number].pdf Page 2 of 8 -------------------------------------------------------------------------------- Compression Therapy Details Patient Name: Date of Service: [name]. 08/11/2022 1:45 PM Medical Record Number: [id number] Patient Account Number: [id number] Date of Birth/Sex: Treating RN: 1951/08/23 (71 y.o. [name] Primary Care Mikeala Girdler: [name] Other Clinician: Referring Aaliyana Fredericks: Treating Brandi Amason/Extender: [name], [name] in Treatment: 9 Compression Therapy Performed for Wound Assessment: NonWound Condition Lymphedema - Left Leg Performed By: Clinician Deon Pilling, RN Compression Type: Rolena Infante Post Procedure Diagnosis Same as Pre-procedure Electronic Signature(s) Signed: 08/11/2022 5:03:28 PM By: Deon Pilling RN, BSN Entered By: Deon Pilling on 08/11/2022 14:33:00 -------------------------------------------------------------------------------- Encounter Discharge Information Details Patient Name: Date of Service: [name]. 08/11/2022 1:45 PM Medical Record Number: [id number] Patient Account Number: [id number] Date of Birth/Sex: Treating RN: [birth year] (71 y.o. [name] Primary Care Jolinda Pinkstaff: [name] Other Clinician: Referring [name]: Treating Junah Yam/Extender: Angelica Pou in Treatment: 9 Encounter Discharge Information Items Discharge Condition:  Stable Ambulatory Status: Wheelchair Discharge Destination: Home Transportation: Private Auto Accompanied By: self Schedule Follow-up Appointment: Yes Clinical Summary of Care: Electronic Signature(s) Signed: 08/11/2022 5:03:28 PM By: Deon Pilling RN, BSN Entered By: Deon Pilling on 08/11/2022 14:34:03 -------------------------------------------------------------------------------- Lower Extremity Assessment Details Patient Name: Date of Service: [name]. 08/11/2022 1:45 PM Medical Record Number: [id number] Patient Account Number: [id number] Date of Birth/Sex: Treating RN: [birth year] (71 y.o. [name] Primary Care Zamya Culhane: [name] Other Clinician: Referring Ike Maragh: Treating Zayven Powe/Extender: [name], [name] in Treatment: 9 Edema Assessment Assessed: [Left: Yes] [Right: Yes] Edema: [Left: Yes] [

## 2022-08-18 ENCOUNTER — Encounter (HOSPITAL_BASED_OUTPATIENT_CLINIC_OR_DEPARTMENT_OTHER): Payer: HMO | Admitting: Physician Assistant

## 2022-08-18 DIAGNOSIS — E11622 Type 2 diabetes mellitus with other skin ulcer: Secondary | ICD-10-CM | POA: Diagnosis not present

## 2022-08-19 NOTE — Progress Notes (Signed)
OLEA, ARAGON [name] ([id number]) [id number] Page 1 of 4 Visit Report for 08/18/2022 Arrival Information Details Patient Name: Date of Service: [name], [name]. 08/18/2022 1:15 PM Medical Record Number: [id number] Patient Account Number: [id number] Date of Birth/Sex: Treating RN: 06/22/1951 (71 y.o. F) Primary Care Avelyn Touch: Georgianne Fick Other Clinician: Referring Chanie Soucek: Treating Norinne Jeane/Extender: Kingsley Callander, [name] in Treatment: 10 Visit Information History Since Last Visit Added or deleted any medications: No Patient Arrived: Wheel Chair Any new allergies or adverse reactions: No Arrival Time: 13:47 Had [name] fall or experienced change in No Accompanied By: son activities of daily living that may affect Transfer Assistance: None risk of falls: Patient Identification Verified: Yes Signs or symptoms of abuse/neglect since last visito No Secondary Verification Process Completed: Yes Hospitalized since last visit: No Patient Requires Transmission-Based No Implantable device outside of the clinic excluding No Precautions: cellular tissue based products placed in the center Patient Has Alerts: Yes since last visit: Patient Alerts: VVS ABI LandR [place] 10/23/21 Has Compression in Place as Prescribed: Yes TBI L 0.29 R 0.28 Pain Present Now: Yes Electronic Signature(s) Signed: 08/18/2022 4:51:51 PM By: Thayer Dallas Entered By: Thayer Dallas on 08/18/2022 13:47:38 -------------------------------------------------------------------------------- Compression Therapy Details Patient Name: Date of Service: Brandi Huff [name]. 08/18/2022 1:15 PM Medical Record Number: [id number] Patient Account Number: [id number] Date of Birth/Sex: Treating RN: 1950/10/29 (71 y.o. F) Primary Care Darwyn Ponzo: Georgianne Fick Other Clinician: Referring Kynesha Guerin: Treating Lance Galas/Extender: Kingsley Callander, [name] in  Treatment: 10 Compression Therapy Performed for Wound Assessment: Wound #3R Right,Posterior Lower Leg Performed By: Clinician Thayer Dallas, Compression Type: Henriette Combs Notes bilateral legs wrapped Electronic Signature(s) Signed: 08/18/2022 4:51:51 PM By: Thayer Dallas Entered By: Thayer Dallas on 08/18/2022 16:43:30 Encounter Discharge Information Details -------------------------------------------------------------------------------- [name] ([id number]) [id number].pdf Page 2 of 4 Patient Name: Date of Service: [name], [name]. 08/18/2022 1:15 PM Medical Record Number: [id number] Patient Account Number: [id number] Date of Birth/Sex: Treating RN: [birth year] (71 y.o. F) Primary Care Yariana Hoaglund: Georgianne Fick Other Clinician: Thayer Dallas Referring Yunique Dearcos: Treating Monifa Blanchette/Extender: Duncan Dull in Treatment: 10 Encounter Discharge Information Items Discharge Condition: Stable Ambulatory Status: Wheelchair Discharge Destination: Home Transportation: Private Auto Accompanied By: son Schedule Follow-up Appointment: Yes Clinical Summary of Care: Electronic Signature(s) Signed: 08/18/2022 4:51:51 PM By: Thayer Dallas Entered By: Thayer Dallas on 08/18/2022 16:44:58 -------------------------------------------------------------------------------- Non-Wound Condition Assessment Details Patient Name: Date of Service: Brandi Huff [name]. 08/18/2022 1:15 PM Medical Record Number: [id number] Patient Account Number: [id number] Date of Birth/Sex: Treating RN: [birth year] (71 y.o. F) Primary Care Collin Rengel: Georgianne Fick Other Clinician: Referring Merek Niu: Treating Britani Beattie/Extender: Kingsley Callander, [name] in Treatment: 10 Non-Wound Condition: Condition: Lymphedema Location: Leg Side: Left Periwound Skin Texture Texture Color No Abnormalities Noted: No No Abnormalities Noted:  No Callus: No Atrophie Blanche: No Crepitus: No Cyanosis: No Excoriation: No Ecchymosis: No Friable: No Erythema: No Induration: No Hemosiderin Staining: Yes Rash: No Mottled: No Scarring: No Pallor: No Rubor: No Moisture No Abnormalities Noted: No Dry / Scaly: No Maceration: No Notes cobblestone appearance Electronic Signature(s) Signed: 08/18/2022 4:51:51 PM By: Thayer Dallas Entered By: Thayer Dallas on 08/18/2022 16:33:06 [name] ([id number]) [id number] Page 3 of 4 -------------------------------------------------------------------------------- Patient/Caregiver Education Details Patient Name: Date of Service: [name], [name] 11/22/2023andnbsp1:15 PM Medical Record Number: [id number] Patient Account Number: [id number] Date of Birth/Gender: Treating RN: [birth year] (71 y.o. F) Primary Care Physician: Georgianne Fick Other Clinician: Thayer Dallas  Referring Physician: Treating Physician/Extender: Duncan Dull in Treatment: 10 Education Assessment Education Provided To: Patient Education Topics Provided Electronic Signature(s) Signed: 08/18/2022 4:51:51 PM By: Thayer Dallas Entered By: Thayer Dallas on 08/18/2022 16:44:28 -------------------------------------------------------------------------------- Wound Assessment Details Patient Name: Date of Service: [name], [name]. 08/18/2022 1:15 PM Medical Record Number: [id number] Patient Account Number: [id number] Date of Birth/Sex: Treating RN: [birth year] (71 y.o. F) Primary Care Amyjo Mizrachi: Georgianne Fick Other Clinician: Referring Casidee Jann: Treating Adriann Ballweg/Extender: Kingsley Callander, [name] in Treatment: 10 Wound Status Wound Number: 3R Primary Etiology: Lymphedema Wound Location: Right, Posterior Lower Leg Wound Status: Open Wounding Event: Blister Date Acquired: 05/26/2022 Weeks Of Treatment:  10 Clustered Wound: No Wound Measurements Length: (cm) 3.5 Width: (cm) 4 Depth: (cm) 0.1 Area: (cm) 10.996 Volume: (cm) 1.1 % Reduction in Area: -72.8% % Reduction in Volume: -73% Wound Description Classification: Full Thickness Without Exposed Suppor Exudate Amount: Medium Exudate Type: Serosanguineous Exudate Color: red, brown t Structures Periwound Skin Texture Texture Color No Abnormalities Noted: No No Abnormalities Noted: No Moisture No Abnormalities Noted: No Treatment Notes Wound #3R (Lower Leg) Wound Laterality: Right, Posterior Cleanser Soap and Water Discharge Instruction: May shower and wash wound with dial antibacterial soap and water prior to dressing change. [name], [name] ([id number]) [id number] Page 4 of 4 Peri-Wound Care Topical Primary Dressing KerraCel Ag Gelling Fiber Dressing, 4x5 in (silver alginate) Discharge Instruction: Apply silver alginate to wound bed as instructed Secondary Dressing ABD Pad, 5x9 Discharge Instruction: Apply over primary dressing as directed. Secured With Compression Wrap Unnaboot w/Calamine, 4x10 (in/yd) Discharge Instruction: Apply Unnaboot as directed. Compression Stockings Add-Ons Electronic Signature(s) Signed: 08/18/2022 4:51:51 PM By: Thayer Dallas Entered By: Thayer Dallas on 08/18/2022 13:48:11 -------------------------------------------------------------------------------- Vitals Details Patient Name: Date of Service: Brandi Huff [name]. 08/18/2022 1:15 PM Medical Record Number: [id number] Patient Account Number: [id number] Date of Birth/Sex: Treating RN: Nov 13, 1950 (71 y.o. F) Primary Care Curtisha Bendix: Georgianne Fick Other Clinician: Referring Hubbert Landrigan: Treating Ugochi Henzler/Extender: Kingsley Callander, [name] in Treatment: 10 Vital Signs Time Taken: 13:47 Temperature (F): 98.1 Height (in): 63 Pulse (bpm): 70 Weight (lbs): 284 Respiratory Rate  (breaths/min): 18 Body Mass Index (BMI): 50.3 Blood Pressure (mmHg): 160/81 Reference Range: 80 - 120 mg / dl Electronic Signature(s) Signed: 08/18/2022 4:51:51 PM By: Thayer Dallas Entered By: Thayer Dallas on 08/18/2022 13:47:59

## 2022-08-23 NOTE — Progress Notes (Signed)
SHANEQUIA, KENDRICK A ([id number]) [id number] Page 1 of 1 Visit Report for 08/18/2022 SuperBill Details Patient Name: [name], [name] Date of Service: 08/18/2022 Medical Record Number: [id number] Patient Account Number: [id number] Date of Birth/Sex: Treating RN: 09/01/1951 (71 y.o. F) Primary Care Provider: [name] Other Clinician: Referring Provider: Treating Provider/Extender: [name], [name] in Treatment: 10 Diagnosis Coding ICD-10 Codes Code Description I89.0 Lymphedema, not elsewhere classified Chronic venous hypertension (idiopathic) with ulcer and inflammation of right lower I87.331 extremity L97.821 Non-pressure chronic ulcer of other part of left lower leg limited to breakdown of skin L97.812 Non-pressure chronic ulcer of other part of right lower leg with fat layer exposed E11.622 Type 2 diabetes mellitus with other skin ulcer I73.89 Other specified peripheral vascular diseases I10 Essential (primary) hypertension Z79.01 Long term (current) use of anticoagulants Facility Procedures CPT4 Code Description Modifier Quantity [id number] - APPLY UNNA BOOT/PROFO BILATERAL 1 Electronic Signature(s) Signed: 08/18/2022 4:51:51 PM By: Erenest Blank Signed: 08/23/2022 4:14:45 PM By: Worthy Keeler PA-C Entered By: Erenest Blank on 08/18/2022 16:51:03

## 2022-08-25 ENCOUNTER — Encounter (HOSPITAL_BASED_OUTPATIENT_CLINIC_OR_DEPARTMENT_OTHER): Payer: HMO | Admitting: General Surgery

## 2022-08-25 DIAGNOSIS — E11622 Type 2 diabetes mellitus with other skin ulcer: Secondary | ICD-10-CM | POA: Diagnosis not present

## 2022-08-25 NOTE — Progress Notes (Signed)
[name], [name] ([id number]) [id number] Page 1 of 4 Visit Report for 08/25/2022 Arrival Information Details Patient Name: Date of Service: [name], [name]. 08/25/2022 1:45 PM Medical Record Number: [id number] Patient Account Number: [id number] Date of Birth/Sex: Treating RN: [birth year] (71 y.o. F) Primary Care Malli Falotico: Georgianne Fick Other Clinician: Referring Torben Soloway: Treating Buddie Marston/Extender: Jacolyn Reedy in Treatment: 11 Visit Information History Since Last Visit Added or deleted any medications: No Patient Arrived: Wheel Chair Any new allergies or adverse reactions: No Arrival Time: 14:02 Had [name] fall or experienced change in No Accompanied By: self activities of daily living that may affect Transfer Assistance: None risk of falls: Patient Identification Verified: Yes Signs or symptoms of abuse/neglect since last visito No Secondary Verification Process Completed: Yes Hospitalized since last visit: No Patient Requires Transmission-Based No Implantable device outside of the clinic excluding No Precautions: cellular tissue based products placed in the center Patient Has Alerts: Yes since last visit: Patient Alerts: VVS ABI LandR [place] 10/23/21 Has Compression in Place as Prescribed: Yes TBI L 0.29 R 0.28 Pain Present Now: Yes Electronic Signature(s) Signed: 08/25/2022 4:47:29 PM By: Thayer Dallas Entered By: Thayer Dallas on 08/25/2022 14:02:53 -------------------------------------------------------------------------------- Compression Therapy Details Patient Name: Date of Service: Brandi Huff [name]. 08/25/2022 1:45 PM Medical Record Number: [id number] Patient Account Number: [id number] Date of Birth/Sex: Treating RN: 08/23/1951 (71 y.o. F) Primary Care Tayshaun Kroh: Georgianne Fick Other Clinician: Referring Mihcael Ledee: Treating Kitty Cadavid/Extender: Jacolyn Reedy in  Treatment: 11 Compression Therapy Performed for Wound Assessment: Wound #3R Right,Posterior Lower Leg Performed By: Clinician Thayer Dallas, Compression Type: Henriette Combs Electronic Signature(s) Signed: 08/25/2022 4:47:29 PM By: Thayer Dallas Entered By: Thayer Dallas on 08/25/2022 16:41:41 -------------------------------------------------------------------------------- Encounter Discharge Information Details Patient Name: Date of Service: Brandi Huff [name]. 08/25/2022 1:45 PM Medical Record Number: [id number] Patient Account Number: [id number] Date of Birth/Sex: Treating RN: Jun 09, 1951 (71 y.o. Laurence Spates, [name] ([id number]) [id number] Page 2 of 4 Primary Care Mel Langan: Georgianne Fick Other Clinician: Thayer Dallas Referring Deloy Archey: Treating Thaddius Manes/Extender: Jacolyn Reedy in Treatment: 11 Encounter Discharge Information Items Discharge Condition: Stable Ambulatory Status: Wheelchair Discharge Destination: Home Transportation: Private Auto Accompanied By: niece Schedule Follow-up Appointment: Yes Clinical Summary of Care: Electronic Signature(s) Signed: 08/25/2022 4:47:29 PM By: Thayer Dallas Entered By: Thayer Dallas on 08/25/2022 16:43:23 -------------------------------------------------------------------------------- Patient/Caregiver Education Details Patient Name: Date of Service: Brandi Huff [name]. 11/29/2023andnbsp1:45 PM Medical Record Number: [id number] Patient Account Number: [id number] Date of Birth/Gender: Treating RN: 1951/06/18 (71 y.o. F) Primary Care Physician: Georgianne Fick Other Clinician: Thayer Dallas Referring Physician: Treating Physician/Extender: Jacolyn Reedy in Treatment: 11 Education Assessment Education Provided To: Patient Education Topics Provided Electronic Signature(s) Signed: 08/25/2022 4:47:29 PM By: Thayer Dallas Entered  By: Thayer Dallas on 08/25/2022 16:43:04 -------------------------------------------------------------------------------- Wound Assessment Details Patient Name: Date of Service: [name], [name]. 08/25/2022 1:45 PM Medical Record Number: [id number] Patient Account Number: [id number] Date of Birth/Sex: Treating RN: 01-Jul-1951 (71 y.o. F) Primary Care Gertha Lichtenberg: Georgianne Fick Other Clinician: Referring Cyenna Rebello: Treating Lenaya Pietsch/Extender: Jacolyn Reedy in Treatment: 11 Wound Status Wound Number: 3R Primary Etiology: Lymphedema Wound Location: Right, Posterior Lower Leg Wound Status: Open Wounding Event: Blister Date Acquired: 05/26/2022 Weeks Of Treatment: 11 Clustered Wound: No Wound Measurements [name], [name] ([id number]) Length: (cm) 3.5 Width: (cm) 4 Depth: (cm) 0.1 Area: (cm) 10.996 Volume: (cm) 1.1 [id number] Page 3 of 4 % Reduction in Area: -72.8% % Reduction in  Volume: -73% Wound Description Classification: Full Thickness Without Exposed Suppor Exudate Amount: Medium Exudate Type: Serosanguineous Exudate Color: red, brown t Structures Periwound Skin Texture Texture Color No Abnormalities Noted: No No Abnormalities Noted: No Moisture No Abnormalities Noted: No Treatment Notes Wound #3R (Lower Leg) Wound Laterality: Right, Posterior Cleanser Soap and Water Discharge Instruction: May shower and wash wound with dial antibacterial soap and water prior to dressing change. Peri-Wound Care Topical Primary Dressing KerraCel Ag Gelling Fiber Dressing, 4x5 in (silver alginate) Discharge Instruction: Apply silver alginate to wound bed as instructed Secondary Dressing ABD Pad, 5x9 Discharge Instruction: Apply over primary dressing as directed. Secured With Compression Wrap Unnaboot w/Calamine, 4x10 (in/yd) Discharge Instruction: Apply Unnaboot as directed. Compression  Stockings Add-Ons Electronic Signature(s) Signed: 08/25/2022 4:47:29 PM By: Thayer Dallas Entered By: Thayer Dallas on 08/25/2022 14:03:12 -------------------------------------------------------------------------------- Vitals Details Patient Name: Date of Service: Brandi Huff [name]. 08/25/2022 1:45 PM Medical Record Number: [id number] Patient Account Number: [id number] Date of Birth/Sex: Treating RN: 12-05-1950 (71 y.o. F) Primary Care Demitrus Francisco: Georgianne Fick Other Clinician: Referring Barnet Benavides: Treating Airyana Sprunger/Extender: Jacolyn Reedy in Treatment: 11 Vital Signs Time Taken: 14:02 Reference Range: 80 - 120 mg / dl Height (in): 63 Weight (lbs): 284 Body Mass Index (BMI): 50.3 Hemme, [name] ([id number]) [id number] Page 4 of 4 Electronic Signature(s) Signed: 08/25/2022 4:47:29 PM By: Thayer Dallas Entered By: Thayer Dallas on 08/25/2022 14:03:00

## 2022-08-26 NOTE — Progress Notes (Signed)
[name], [name] ([id number]) [id number] Page 1 of 1 Visit Report for 08/25/2022 SuperBill Details Patient Name: [name], [name] Date of Service: 08/25/2022 Medical Record Number: [id number] Patient Account Number: [id number] Date of Birth/Sex: Treating RN: 04/24/1951 (71 y.o. F) Primary Care Provider: [name] Other Clinician: Referring Provider: Treating Provider/Extender: Rhodia Albright in Treatment: 11 Diagnosis Coding ICD-10 Codes Code Description I89.0 Lymphedema, not elsewhere classified I87.331 Chronic venous hypertension (idiopathic) with ulcer and inflammation of right lower extremity L97.821 Non-pressure chronic ulcer of other part of left lower leg limited to breakdown of skin L97.812 Non-pressure chronic ulcer of other part of right lower leg with fat layer exposed E11.622 Type 2 diabetes mellitus with other skin ulcer I73.89 Other specified peripheral vascular diseases I10 Essential (primary) hypertension Z79.01 Long term (current) use of anticoagulants Facility Procedures CPT4 Code Description Modifier Quantity [id number] - APPLY UNNA BOOT/PROFO BILATERAL 1 Electronic Signature(s) Signed: 08/25/2022 4:47:29 PM By: Erenest Blank Signed: 08/26/2022 8:16:37 AM By: Fredirick Maudlin MD FACS Entered By: Erenest Blank on 08/25/2022 16:43:50

## 2022-09-01 ENCOUNTER — Encounter (HOSPITAL_BASED_OUTPATIENT_CLINIC_OR_DEPARTMENT_OTHER): Payer: HMO | Attending: Physician Assistant | Admitting: Physician Assistant

## 2022-09-01 ENCOUNTER — Ambulatory Visit (INDEPENDENT_AMBULATORY_CARE_PROVIDER_SITE_OTHER): Payer: HMO

## 2022-09-01 ENCOUNTER — Ambulatory Visit (INDEPENDENT_AMBULATORY_CARE_PROVIDER_SITE_OTHER): Payer: HMO | Admitting: Orthopedic Surgery

## 2022-09-01 DIAGNOSIS — I10 Essential (primary) hypertension: Secondary | ICD-10-CM | POA: Insufficient documentation

## 2022-09-01 DIAGNOSIS — M25551 Pain in right hip: Secondary | ICD-10-CM

## 2022-09-01 DIAGNOSIS — G8929 Other chronic pain: Secondary | ICD-10-CM

## 2022-09-01 DIAGNOSIS — E11622 Type 2 diabetes mellitus with other skin ulcer: Secondary | ICD-10-CM | POA: Insufficient documentation

## 2022-09-01 DIAGNOSIS — M25561 Pain in right knee: Secondary | ICD-10-CM

## 2022-09-01 DIAGNOSIS — L97821 Non-pressure chronic ulcer of other part of left lower leg limited to breakdown of skin: Secondary | ICD-10-CM | POA: Diagnosis not present

## 2022-09-01 DIAGNOSIS — I890 Lymphedema, not elsewhere classified: Secondary | ICD-10-CM | POA: Insufficient documentation

## 2022-09-01 DIAGNOSIS — Z7901 Long term (current) use of anticoagulants: Secondary | ICD-10-CM | POA: Insufficient documentation

## 2022-09-01 DIAGNOSIS — M79604 Pain in right leg: Secondary | ICD-10-CM

## 2022-09-01 DIAGNOSIS — L97812 Non-pressure chronic ulcer of other part of right lower leg with fat layer exposed: Secondary | ICD-10-CM | POA: Insufficient documentation

## 2022-09-01 DIAGNOSIS — E1151 Type 2 diabetes mellitus with diabetic peripheral angiopathy without gangrene: Secondary | ICD-10-CM | POA: Insufficient documentation

## 2022-09-01 MED ORDER — TRAMADOL HCL 50 MG PO TABS: 50.0000 mg | ORAL_TABLET | Freq: Two times a day (BID) | ORAL | 0 refills | Status: DC | PRN

## 2022-09-01 NOTE — Progress Notes (Addendum)
ANALEIGH, ARIES A ([id number]) [id number] Page 1 of 7 Visit Report for 09/01/2022 Chief Complaint Document Details Patient Name: Date of Service: LAVRA, IMLER A. 09/01/2022 9:15 A M Medical Record Number: [id number] Patient Account Number: [id number] Date of Birth/Sex: Treating RN: August 02, 1951 (71 y.o. F) Primary Care Provider: [name] Other Clinician: Referring Provider: Treating Provider/Extender: [name], [name] in Treatment: 12 Information Obtained from: Patient Chief Complaint Right LE Ulcers Electronic Signature(s) Signed: 09/01/2022 9:28:30 AM By: Worthy Keeler PA-C Entered By: Worthy Keeler on 09/01/2022 09:28:30 -------------------------------------------------------------------------------- HPI Details Patient Name: Date of Service: [name] 09/01/2022 9:15 A M Medical Record Number: [id number] Patient Account Number: [id number] Date of Birth/Sex: Treating RN: 1950-11-30 (71 y.o. F) Primary Care Provider: [name] Other Clinician: Referring Provider: Treating Provider/Extender: [name], [name] in Treatment: 12 History of Present Illness HPI Description: 08/05/2021 upon evaluation today patient has bilateral wounds on her lower extremities. She has been tolerating the dressing changes without complication at home but unfortunately is not really seeing signs of improvement like she would like to see. She does have what appears to be bilateral lymphedema due to chronic venous insufficiency most likely. She is on fluid pills and takes them regularly. She really does not sit with her legs elevated much she has a recliner but again does not use that often. I think she should be using this more to get her feet up. Subsequently she tells me this has been going on for several weeks to even months. She finally decided to make a referral herself to come in to be  seen. Patient does have a history of diabetes mellitus type 2, some evidence of potential peripheral vascular disease as she is noncompressible with her ABIs in the office today, hypertension, and she is on long-term anticoagulant therapy. 08/12/2021 upon evaluation today patient's wounds appear to be doing well in regard to her legs currently. Actually very pleased with where things stand and I do not see any signs of active infection which is great news. No fevers, chills, nausea, vomiting, or diarrhea. 08/19/2021 upon evaluation today patient appears to be doing well with regard to her legs. Everything actually appears to be healing quite nicely with doing extremely well. Fortunately there does not appear to be any signs of active infection which is great news. Readmission: 06-09-2022 upon evaluation today patient appears for reevaluation in the clinic concerning issues that she has been having with her leg. This is actually her right leg at this time though when I saw her previously was both legs. Fortunately there does not appear to be any evidence of active infection at this time which is great news. No fevers, chills, nausea, vomiting, or diarrhea. Her past medical history really has not changed significantly since last time I saw her. I did discuss with her today how she has been doing with wearing her compression socks she tells me she really has not been wearing those because "she cannot get them on her son is not able to help. She tells me that he works at night and by the time he comes and goes to bed she is not up yet and then she wakes up later in the past do not cross. I talked her today about actually having a time later in the day when he could help with getting the socks on and that is a possibility. With that being said she currently states that she had not  thought through that completely I am not sure if that something she has been worked out or not. 06-16-2022 upon evaluation today  patient appears to be doing well currently in regard to her wounds all things considered. I am seeing excellent signs of improvement I think were getting very close to complete resolution which is great news and overall I do not see anything that seems to be worsening significantly also great news. In general I think we are on the right track here. She does have a follow-up with vein and vascular next Friday. 06-23-2022 upon evaluation today the wound on patient's right leg actually appears to be healed unfortunately she has an area on the left leg which is still open. DESHANNA, KAMA A ([id number]) [id number] Page 2 of 7 I am thinking that this may be brand-new skin on the right leg so I think we probably should still continue to wrap her for at least 1 more week and I discussed that with her today. Otherwise I think that we are on the right track for the most part. 07-07-2022 upon evaluation today patient appears to be doing well currently in regard to her wound she actually appears to be healed which is good news. Fortunately I do not see any signs of infection. The downside is that she actually did have her arterial studies that showed that on 06-25-2022 that her TBI on the right was 0.30 and on the left was 0.25. This is obviously very low and I think that that is going to continue to be an ongoing issue and something has to be monitored for her. I discussed that with the patient today. I did review Dr. Unk Lightning note had vascular surgery as well and he noted that to be honest right now being that she is not a great surgical candidate he would not want to proceed with any further intervention though she does not feel her stay healed then the neck step would be to do endovascular intervention. 10/18; the patient comes in without her juxta lite stockings on since yesterday. She said they were too tight and were cutting into her upper posterior calf. She took them off but  did not bring them in today. She does not have an open wound however her edema control is very poor and she is certainly at risk for reopening 11/8; patient was discharged 2 weeks ago in bilateral juxta lite stockings. Apparently she noted drainage coming out of both her legs about a week ago she has not been wearing the stockings since. She has reopening of the area on the right posterior calf and 2 small areas anteriorly and laterally on the left. Most substantial areas on the right posterior calf 08-11-2022 upon evaluation today patient appears to be doing well currently in regard to her wound. She has been improving since last week. The good news is she does not show any signs of active infection at this time which is excellent. She does seem to be doing better especially in regard to the left leg which is completely healed the right leg unfortunately is still open and this is still giving her trouble. 09-01-2022 upon evaluation today patient's right leg actually appears to be healed the left leg that she was post to be using her Velcro compression wrap on she did not over the past week and subsequently this has started to weep a little bit though there are any actual wounds open at this point. I had a discussion with the patient about  this today. I explained that she is going to absolutely need to wear her compression if she is can have any chance of keeping this closed and not ended up just back-and-forth here in the clinic. She voiced understanding. With that being said I do think that she needs to be wrapped on both legs today to allow the areas to toughen up regularly with wraps which seem to be doing a good job. Electronic Signature(s) Signed: 09/01/2022 10:44:43 AM By: Worthy Keeler PA-C Entered By: Worthy Keeler on 09/01/2022 10:44:43 -------------------------------------------------------------------------------- Physical Exam Details Patient Name: Date of Service: PENNIE, VANBLARCOM A. 09/01/2022 9:15 A M Medical Record Number: [id number] Patient Account Number: [id number] Date of Birth/Sex: Treating RN: April 18, 1951 (71 y.o. F) Primary Care Provider: [name] Other Clinician: Referring Provider: Treating Provider/Extender: [name], [name] in Treatment: 12 Constitutional Well-nourished and well-hydrated in no acute distress. Respiratory normal breathing without difficulty. Psychiatric this patient is able to make decisions and demonstrates good insight into disease process. Alert and Oriented x 3. pleasant and cooperative. Notes Upon inspection patient's wound bed actually showed signs elation of good great and epithelization at this point. Fortunately I do not see any evidence of active infection locally nor systemically which is great news and overall I am extremely pleased with where things stand currently. No fevers, chills, nausea, vomiting, or diarrhea. Electronic Signature(s) Signed: 09/01/2022 10:45:00 AM By: Worthy Keeler PA-C Entered By: Worthy Keeler on 09/01/2022 10:44:59 -------------------------------------------------------------------------------- Physician Orders Details Patient Name: Date of Service: [name] 09/01/2022 9:15 A M Medical Record Number: [id number] Patient Account Number: [id number] YARELIZ, THORSTENSON A ([id number]) ([id number]) [id number] Page 3 of 7 Date of Birth/Sex: Treating RN: [birth year] (71 y.o. [name], [name] Primary Care Provider: [name] Other Clinician: Referring Provider: Treating Provider/Extender: Angelica Pou in Treatment: 12 Verbal / Phone Orders: No Diagnosis Coding ICD-10 Coding Code Description I89.0 Lymphedema, not elsewhere classified I87.331 Chronic venous hypertension (idiopathic) with ulcer and inflammation of right lower extremity L97.821 Non-pressure chronic ulcer of other part of left  lower leg limited to breakdown of skin L97.812 Non-pressure chronic ulcer of other part of right lower leg with fat layer exposed E11.622 Type 2 diabetes mellitus with other skin ulcer I73.89 Other specified peripheral vascular diseases I10 Essential (primary) hypertension Z79.01 Long term (current) use of anticoagulants Follow-up Appointments ppointment in 2 weeks. Margarita Grizzle Wednesday. Return A Nurse Visit: - next Wednesday 09/08/22 Other: - [facility] 09/10/2022 appt time. Bathing/ Shower/ Hygiene May shower with protection but do not get wound dressing(s) wet. Edema Control - Lymphedema / SCD / Other Elevate legs to the level of the heart or above for 30 minutes daily and/or when sitting, a frequency of: Avoid standing for long periods of time. Other Edema Control Orders/Instructions: - unna boot to left leg. Wound Treatment Wound #3R - Lower Leg Wound Laterality: Right, Posterior Cleanser: Soap and Water 1 x Per Week/30 Days Discharge Instructions: May shower and wash wound with dial antibacterial soap and water prior to dressing change. Prim Dressing: KerraCel Ag Gelling Fiber Dressing, 4x5 in (silver alginate) 1 x Per Week/30 Days ary Discharge Instructions: Apply silver alginate to wound bed as instructed Secondary Dressing: ABD Pad, 5x9 1 x Per Week/30 Days Discharge Instructions: Apply over primary dressing as directed. Compression Wrap: Unnaboot w/Calamine, 4x10 (in/yd) 1 x Per Week/30 Days Discharge Instructions: Apply Unnaboot as directed. Electronic Signature(s) Signed: 09/01/2022 4:11:00 PM By: Melburn Hake,  Margarita Grizzle PA-C Signed: 09/29/2022 5:35:40 PM By: Rhae Hammock RN Entered By: Rhae Hammock on 09/01/2022 10:20:51 -------------------------------------------------------------------------------- Problem List Details Patient Name: Date of Service: [name] 09/01/2022 9:15 A M Medical Record Number: [id number] Patient Account Number: [id number] Date of Birth/Sex:  Treating RN: 02/19/1951 (71 y.o. F) Primary Care Provider: [name] Other Clinician: Referring Provider: Treating Provider/Extender: [name], Leonidas Romberg in Treatment: 12 Active Problems ICD-10 KIRSTON, LUTY A ([id number]) [id number] Page 4 of 7 Encounter Code Description Active Date MDM Diagnosis I89.0 Lymphedema, not elsewhere classified 06/09/2022 No Yes I87.331 Chronic venous hypertension (idiopathic) with ulcer and inflammation of right 06/09/2022 No Yes lower extremity L97.821 Non-pressure chronic ulcer of other part of left lower leg limited to breakdown 08/04/2022 No Yes of skin L97.812 Non-pressure chronic ulcer of other part of right lower leg with fat layer 06/09/2022 No Yes exposed E11.622 Type 2 diabetes mellitus with other skin ulcer 06/09/2022 No Yes I73.89 Other specified peripheral vascular diseases 06/09/2022 No Yes I10 Essential (primary) hypertension 06/09/2022 No Yes Z79.01 Long term (current) use of anticoagulants 06/09/2022 No Yes Inactive Problems Resolved Problems Electronic Signature(s) Signed: 09/01/2022 9:28:16 AM By: Worthy Keeler PA-C Entered By: Worthy Keeler on 09/01/2022 09:28:15 -------------------------------------------------------------------------------- Progress Note Details Patient Name: Date of Service: [name] 09/01/2022 9:15 A M Medical Record Number: [id number] Patient Account Number: [id number] Date of Birth/Sex: Treating RN: [birth year] (71 y.o. F) Primary Care Provider: [name] Other Clinician: Referring Provider: Treating Provider/Extender: Angelica Pou in Treatment: 12 Subjective Chief Complaint Information obtained from Patient Right LE Ulcers History of Present Illness (HPI) 08/05/2021 upon evaluation today patient has bilateral wounds on her lower extremities. She has been tolerating the dressing changes without  complication at home but unfortunately is not really seeing signs of improvement like she would like to see. She does have what appears to be bilateral lymphedema due to chronic venous insufficiency most likely. She is on fluid pills and takes them regularly. She really does not sit with her legs elevated much she has a recliner but again does not use that often. I think she should be using this more to get her feet up. Subsequently she tells me this has been going on for several weeks to even months. She finally decided to make a referral herself to come in to be seen. ZAWADI, APLIN A ([id number]) [id number] Page 5 of 7 Patient does have a history of diabetes mellitus type 2, some evidence of potential peripheral vascular disease as she is noncompressible with her ABIs in the office today, hypertension, and she is on long-term anticoagulant therapy. 08/12/2021 upon evaluation today patient's wounds appear to be doing well in regard to her legs currently. Actually very pleased with where things stand and I do not see any signs of active infection which is great news. No fevers, chills, nausea, vomiting, or diarrhea. 08/19/2021 upon evaluation today patient appears to be doing well with regard to her legs. Everything actually appears to be healing quite nicely with doing extremely well. Fortunately there does not appear to be any signs of active infection which is great news. Readmission: 06-09-2022 upon evaluation today patient appears for reevaluation in the clinic concerning issues that she has been having with her leg. This is actually her right leg at this time though when I saw her previously was both legs. Fortunately there does not appear to be any evidence of active infection at this time which  is great news. No fevers, chills, nausea, vomiting, or diarrhea. Her past medical history really has not changed significantly since last time I saw her. I did discuss  with her today how she has been doing with wearing her compression socks she tells me she really has not been wearing those because "she cannot get them on her son is not able to help. She tells me that he works at night and by the time he comes and goes to bed she is not up yet and then she wakes up later in the past do not cross. I talked her today about actually having a time later in the day when he could help with getting the socks on and that is a possibility. With that being said she currently states that she had not thought through that completely I am not sure if that something she has been worked out or not. 06-16-2022 upon evaluation today patient appears to be doing well currently in regard to her wounds all things considered. I am seeing excellent signs of improvement I think were getting very close to complete resolution which is great news and overall I do not see anything that seems to be worsening significantly also great news. In general I think we are on the right track here. She does have a follow-up with vein and vascular next Friday. 06-23-2022 upon evaluation today the wound on patient's right leg actually appears to be healed unfortunately she has an area on the left leg which is still open. I am thinking that this may be brand-new skin on the right leg so I think we probably should still continue to wrap her for at least 1 more week and I discussed that with her today. Otherwise I think that we are on the right track for the most part. 07-07-2022 upon evaluation today patient appears to be doing well currently in regard to her wound she actually appears to be healed which is good news. Fortunately I do not see any signs of infection. The downside is that she actually did have her arterial studies that showed that on 06-25-2022 that her TBI on the right was 0.30 and on the left was 0.25. This is obviously very low and I think that that is going to continue to be an ongoing issue and  something has to be monitored for her. I discussed that with the patient today. I did review Dr. Unk Lightning note had vascular surgery as well and he noted that to be honest right now being that she is not a great surgical candidate he would not want to proceed with any further intervention though she does not feel her stay healed then the neck step would be to do endovascular intervention. 10/18; the patient comes in without her juxta lite stockings on since yesterday. She said they were too tight and were cutting into her upper posterior calf. She took them off but did not bring them in today. She does not have an open wound however her edema control is very poor and she is certainly at risk for reopening 11/8; patient was discharged 2 weeks ago in bilateral juxta lite stockings. Apparently she noted drainage coming out of both her legs about a week ago she has not been wearing the stockings since. She has reopening of the area on the right posterior calf and 2 small areas anteriorly and laterally on the left. Most substantial areas on the right posterior calf 08-11-2022 upon evaluation today patient appears to be  doing well currently in regard to her wound. She has been improving since last week. The good news is she does not show any signs of active infection at this time which is excellent. She does seem to be doing better especially in regard to the left leg which is completely healed the right leg unfortunately is still open and this is still giving her trouble. 09-01-2022 upon evaluation today patient's right leg actually appears to be healed the left leg that she was post to be using her Velcro compression wrap on she did not over the past week and subsequently this has started to weep a little bit though there are any actual wounds open at this point. I had a discussion with the patient about this today. I explained that she is going to absolutely need to wear her compression if she is can have  any chance of keeping this closed and not ended up just back-and-forth here in the clinic. She voiced understanding. With that being said I do think that she needs to be wrapped on both legs today to allow the areas to toughen up regularly with wraps which seem to be doing a good job. Objective Constitutional Well-nourished and well-hydrated in no acute distress. Vitals Time Taken: 10:00 AM, Height: 63 in, Weight: 284 lbs, BMI: 50.3, Temperature: 98.5 F, Pulse: 93 bpm, Respiratory Rate: 20 breaths/min, Blood Pressure: 140/84 mmHg. Respiratory normal breathing without difficulty. Psychiatric this patient is able to make decisions and demonstrates good insight into disease process. Alert and Oriented x 3. pleasant and cooperative. General Notes: Upon inspection patient's wound bed actually showed signs elation of good great and epithelization at this point. Fortunately I do not see any evidence of active infection locally nor systemically which is great news and overall I am extremely pleased with where things stand currently. No fevers, chills, nausea, vomiting, or diarrhea. Integumentary (Hair, Skin) Wound #3R status is Open. Original cause of wound was Blister. The date acquired was: 05/26/2022. The wound has been in treatment 12 weeks. The wound is located on the Right,Posterior Lower Leg. The wound measures 0cm length x 0cm width x 0cm depth; 0cm^2 area and 0cm^3 volume. There is no tunneling or undermining noted. There is a none present amount of drainage noted. The wound margin is distinct with the outline attached to the wound base. There is no granulation within the wound bed. There is no necrotic tissue within the wound bed. The periwound skin appearance did not exhibit: Callus, Crepitus, Excoriation, Induration, Rash, Scarring, Dry/Scaly, Maceration, Atrophie Blanche, Cyanosis, Ecchymosis, Hemosiderin Staining, Mottled, Pallor, Rubor, Erythema. Other Condition(s) Patient presents with  Lymphedema located on the Left Leg. The skin appearance exhibited: Hemosiderin Staining. The skin appearance did not exhibit: Atrophie Blanche, Callus, Crepitus, Cyanosis, Dry/Scaly, Ecchymosis, Erythema, Excoriation, Friable, Induration, Maceration, Mottled, Pallor, Rash, Rubor, Scarring. YVAINE, JANKOWIAK A ([id number]) [id number] Page 6 of 7 General Notes: cobblestone appearance. blisters and weeping noted. Assessment Active Problems ICD-10 Lymphedema, not elsewhere classified Chronic venous hypertension (idiopathic) with ulcer and inflammation of right lower extremity Non-pressure chronic ulcer of other part of left lower leg limited to breakdown of skin Non-pressure chronic ulcer of other part of right lower leg with fat layer exposed Type 2 diabetes mellitus with other skin ulcer Other specified peripheral vascular diseases Essential (primary) hypertension Long term (current) use of anticoagulants Procedures Wound #3R Pre-procedure diagnosis of Wound #3R is a Lymphedema located on the Right,Posterior Lower Leg . There was a Haematologist Compression Therapy Procedure by  Rhae Hammock, RN. Post procedure Diagnosis Wound #3R: Same as Pre-Procedure Plan Follow-up Appointments: Return Appointment in 2 weeks. Margarita Grizzle Wednesday. Nurse Visit: - next Wednesday 09/08/22 Other: - [facility] 09/10/2022 appt time. Bathing/ Shower/ Hygiene: May shower with protection but do not get wound dressing(s) wet. Edema Control - Lymphedema / SCD / Other: Elevate legs to the level of the heart or above for 30 minutes daily and/or when sitting, a frequency of: Avoid standing for long periods of time. Other Edema Control Orders/Instructions: - unna boot to left leg. WOUND #3R: - Lower Leg Wound Laterality: Right, Posterior Cleanser: Soap and Water 1 x Per Week/30 Days Discharge Instructions: May shower and wash wound with dial antibacterial soap and water prior to dressing  change. Prim Dressing: KerraCel Ag Gelling Fiber Dressing, 4x5 in (silver alginate) 1 x Per Week/30 Days ary Discharge Instructions: Apply silver alginate to wound bed as instructed Secondary Dressing: ABD Pad, 5x9 1 x Per Week/30 Days Discharge Instructions: Apply over primary dressing as directed. Com pression Wrap: Unnaboot w/Calamine, 4x10 (in/yd) 1 x Per Week/30 Days Discharge Instructions: Apply Unnaboot as directed. 1. I am going to suggest that we have the patient go ahead and continue with the Unna boot wraps bilaterally. 2. I am also going to recommend at this time that we have the patient continue with the silver alginate dressing to any open weeping areas. 3. I am also can recommend that she should continue to elevate her legs much as possible. Based on what I am seeing I do believe that the patient would benefit from having a good plan going forward for her Velcro compression wraps. Again she needs to discuss with her family the best way to be able to get these on she has 2 weeks to do so as we can wrap her for the next 2 weeks and then subsequently will I will see her at that 2-week mark. At that point she will bring her wraps whether we will put him on at that time and then we will see how things do going forward. She voiced understanding. Electronic Signature(s) Signed: 09/01/2022 10:47:33 AM By: Worthy Keeler PA-C Entered By: Worthy Keeler on 09/01/2022 10:47:33 SuperBill Details -------------------------------------------------------------------------------- [name] ([id number]) [id number] Page 7 of 7 Patient Name: Date of Service: LEMON, WHITACRE A. 09/01/2022 Medical Record Number: [id number] Patient Account Number: [id number] Date of Birth/Sex: Treating RN: 1951-09-15 (71 y.o. [name], [name] Primary Care Provider: [name] Other Clinician: Referring Provider: Treating Provider/Extender: Angelica Pou in Treatment: 12 Diagnosis Coding ICD-10 Codes Code Description I89.0 Lymphedema, not elsewhere classified I87.331 Chronic venous hypertension (idiopathic) with ulcer and inflammation of right lower extremity L97.821 Non-pressure chronic ulcer of other part of left lower leg limited to breakdown of skin L97.812 Non-pressure chronic ulcer of other part of right lower leg with fat layer exposed E11.622 Type 2 diabetes mellitus with other skin ulcer I73.89 Other specified peripheral vascular diseases I10 Essential (primary) hypertension Z79.01 Long term (current) use of anticoagulants Facility Procedures CPT4 Code Description Modifier Quantity [id number] - APPLY UNNA BOOT/PROFO BILATERAL 1 Physician Procedures Quantity CPT4 Code Description Modifier [id number] - WC PHYS LEVEL 3 - EST PT 1 ICD-10 Diagnosis Description I89.0 Lymphedema, not elsewhere classified I87.331 Chronic venous hypertension (idiopathic) with ulcer and inflammation of right lower extremity L97.821 Non-pressure chronic ulcer of other part of left lower leg limited to breakdown of skin L97.812 Non-pressure chronic ulcer of other part of  right lower leg with fat layer exposed Electronic Signature(s) Signed: 09/01/2022 11:11:13 AM By: Worthy Keeler PA-C Entered By: Worthy Keeler on 09/01/2022 11:11:12

## 2022-09-02 ENCOUNTER — Ambulatory Visit: Payer: HMO

## 2022-09-04 ENCOUNTER — Encounter: Payer: Self-pay | Admitting: Orthopedic Surgery

## 2022-09-04 NOTE — Progress Notes (Signed)
Office Visit Note   Patient: [name]           Date of Birth: Nov 13, 1950           MRN: [id number] Visit Date: 09/01/2022 Requested by: [name], [address] [place],  [place] [address] PCP: [name], MD  Subjective: Chief Complaint  Patient presents with   Right Knee - Pain   Right Hip - Pain    HPI: [name] is a 71 y.o. female who presents to the office reporting right knee and right hip pain.  She states she has bilateral knee pain 24/7.  Worse on the right-hand side.  Currently she is in a wheelchair.  She also reports some groin pain.  Has a knee replacement on the left which is over 83 years old.  Denies any fevers or chills.  Hard for her to walk.  She does use a rollator.  Denies any low back pain.  Sitting is worse for her.  Localizes the pain on the right-hand side in the midshaft of the femur..                ROS: All systems reviewed are negative as they relate to the chief complaint within the history of present illness.  Patient denies fevers or chills.  Assessment & Plan: Visit Diagnoses:  1. Chronic pain of right knee   2. Pain in right hip   3. Pain in right leg     Plan: Impression is bilateral knee pain with well-functioning knee replacement noted on the left with no effusion.  Right side has end-stage severe arthritis.  Hip x-rays underwhelming for arthritis.  Needs MRI right femur to evaluate midshaft femoral pain.  Could be a stress reaction on that side.  Could also be referred pain from the back in terms of stenosis.  Tramadol also prescribed.  Follow-up after that study.  Follow-Up Instructions: No follow-ups on file.   Orders:  Orders Placed This Encounter  Procedures   XR HIP UNILAT W OR W/O PELVIS 2-3 VIEWS RIGHT   XR Knee 1-2 Views Right   MR FRMUR RIGHT WO CONTRAST   Meds ordered this encounter  Medications   traMADol (ULTRAM) 50 MG tablet    Sig: Take 1 tablet (50 mg total) by mouth  every 12 (twelve) hours as needed.    Dispense:  40 tablet    Refill:  0      Procedures: No procedures performed   Clinical Data: No additional findings.  Objective: Vital Signs: There were no vitals taken for this visit.  Physical Exam:  Constitutional: Patient appears well-developed HEENT:  Head: Normocephalic Eyes:EOM are normal Neck: Normal range of motion Cardiovascular: Normal rate Pulmonary/chest: Effort normal Neurologic: Patient is alert Skin: Skin is warm Psychiatric: Patient has normal mood and affect  Ortho Exam: Ortho exam demonstrates right knee range of motion of 10-90.  Extensor mechanism intact.  No calf tenderness negative Homans.  Left knee demonstrates no effusion no warmth over the incision and good range of motion past 90 degrees with full extension and intact extensor mechanism.  No groin pain on either side with internal or external rotation of the leg.  No other masses lymphadenopathy or skin changes noted in that leg region.  Specialty Comments:  No specialty comments available.  Imaging: No results found.   PMFS History: Patient Active Problem List   Diagnosis Date Noted   Atrial flutter ([place]) 07/14/2021  Chest pain with moderate risk for cardiac etiology 07/14/2021   COPD (chronic obstructive pulmonary disease) ([place]) 12/05/2020   Hypercholesterolemia 01/11/2018   Diabetes mellitus type 2 in obese ([place]) 01/11/2018   Pulmonary nodules/lesions, multiple 11/23/2017   RBBB 10/31/2017   S/P CABG x 1 08/17/2017   Abnormal nuclear stress test 02/05/2016   Chest pain with high risk for cardiac etiology 02/05/2016   CAD S/P percutaneous coronary angioplasty 02/05/2016   GERD (gastroesophageal reflux disease) 01/06/2016   CKD (chronic kidney disease) stage 3, GFR 30-59 ml/min (HCC) 01/06/2016   Chronic diastolic heart failure ([place]) 06/28/2015   Esophageal reflux 02/15/2014   Anemia 01/17/2014   S/P left TK revision 12/31/2013   Knee  osteomyelitis, left ([place]) 11/21/2013   Foreign body of knee with infection 11/18/2013   Unspecified constipation 11/07/2013   Expected blood loss anemia 11/06/2013   Left knee spacer, following removal of joint prosthesis 11/05/2013   DM (diabetes mellitus), type 2 with peripheral vascular complications ([place]) 53/66/4403   Onychomycosis 10/23/2013   Pain, foot 10/23/2013   DJD (degenerative joint disease) of knee 04/10/2013   Vaginal atrophy 01/01/2013   OAB (overactive bladder) 01/01/2013   Osteopenia 01/01/2013   Chest pain 11/28/2012   Palpitations 11/22/2012   Dyslipidemia 11/22/2012   Chronic anticoagulation 11/22/2012   Super obese 07/11/2012   Trimalleolar fracture 07/07/2012   Coronary artery disease involving native coronary artery of native heart without angina pectoris 04/08/2012   ICM, EF 40-45% cath 04/07/12- improved to 50-60% by echo Oct 2013 04/08/2012   Paroxysmal atrial fibrillation ([place]) 04/07/2012   NSTEMI (non-ST elevated myocardial infarction)- 7/13 04/07/2012   Unstable angina ([place]) 04/07/2012   PVD (peripheral vascular disease), Lt SFA PTA Aug 2012 04/07/2012   Cancer of left lung parenchyma ([place]) 01/13/2009   Insulin dependent diabetes mellitus ([place]) 11/27/2008   Essential hypertension 11/27/2008   Past Medical History:  Diagnosis Date   Arthritis    "knees" (02/05/2016)   Cancer ([place]) 5/10   LUL Vats    CHF (congestive heart failure) ([place])    Coronary artery disease    GERD (gastroesophageal reflux disease)    tx meds   H/O hiatal hernia    History of blood transfusion "several"   last april 2015 s/p knee surgery (02/05/2016)   Hypercholesteremia    Hypertension    Lung cancer ([place])    2010, surgery 18% left lung no radiation or chemo   Non-STEMI (non-ST elevated myocardial infarction) ([place]) 04/07/2012   See cath results below   Paroxysmal atrial fibrillation ([place]) 04/07/2012   On Warfarin   Peripheral vascular disease ([place]) 8/12   Lt SFA PTA    Presence of stent in LAD coronary artery 04/07/2012   Xience Expedition DES 2.75 mm x 18 mm (dilated to 3.0 mm)   Sleep apnea    does not wear CPAP   Type II diabetes mellitus (HCC)    insulin dependent    Family History  Problem Relation Age of Onset   Hypertension Mother    Coronary artery disease Brother    Hypertension Sister     Past Surgical History:  Procedure Laterality Date   ABDOMINAL HYSTERECTOMY  1990   BREAST EXCISIONAL BIOPSY Right    CARDIAC CATHETERIZATION  11/04/2008   patent RCA, LM, and Circ, nl EF   CARDIAC CATHETERIZATION  02/20/2002   patent coronaries with the only abnormality being a smooth luminla irregularity in the mid intermediate ramus branch no felt to be hemodynamically  significant, nl LV   CARDIAC CATHETERIZATION N/A 02/05/2016   Procedure: Left Heart Cath and Coronary Angiography;  Surgeon: Leonie Man, MD;  Location: [facility];  Service: Cardiovascular;  Laterality: N/A;   CARDIAC CATHETERIZATION N/A 02/05/2016   Procedure: Coronary Stent Intervention;  Surgeon: Leonie Man, MD;  Location: [facility];  Service: Cardiovascular;  Laterality: N/A;   CARDIOVERSION N/A 07/16/2021   Procedure: CARDIOVERSION;  Surgeon: Werner Lean, MD;  Location: [facility];  Service: Cardiovascular;  Laterality: N/A;   CATARACT EXTRACTION W/ INTRAOCULAR LENS  IMPLANT, BILATERAL  2013   CORONARY ANGIOPLASTY WITH STENT PLACEMENT  04/07/2012   Xience Expedition DES 2.1mm x 18 mm (dilated to 3.0 mm) to the prox LAD    CORONARY ANGIOPLASTY WITH STENT PLACEMENT  2013; 2015   CORONARY ARTERY BYPASS GRAFT N/A 08/17/2017   Procedure: CORONARY ARTERY BYPASS GRAFTING (CABG) TIMES 1 USING LEFT INTERNAL MAMMARY ARTERY;  Surgeon: Melrose Nakayama, MD;  Location: [facility];  Service: Open Heart Surgery;  Laterality: N/A;   DILATION AND CURETTAGE OF UTERUS  <1990   ESOPHAGOGASTRODUODENOSCOPY N/A 05/23/2015   Procedure: ESOPHAGOGASTRODUODENOSCOPY (EGD);   Surgeon: Carol Ada, MD;  Location: [facility];  Service: Endoscopy;  Laterality: N/A;   ESOPHAGOGASTRODUODENOSCOPY (EGD) WITH PROPOFOL N/A 06/13/2015   Procedure: ESOPHAGOGASTRODUODENOSCOPY (EGD) WITH PROPOFOL;  Surgeon: Carol Ada, MD;  Location: [facility];  Service: Endoscopy;  Laterality: N/A;   ESOPHAGOGASTRODUODENOSCOPY (EGD) WITH PROPOFOL N/A 04/07/2018   Procedure: ESOPHAGOGASTRODUODENOSCOPY (EGD) WITH PROPOFOL;  Surgeon: Carol Ada, MD;  Location: [facility];  Service: Endoscopy;  Laterality: N/A;  fluoroscopy is needed   FRACTURE SURGERY     HAMMER TOE SURGERY Bilateral 1993   with screws, on screw removed   JOINT REPLACEMENT     KNEE ARTHROSCOPY Bilateral    left x2, right x1   LAPAROSCOPIC CHOLECYSTECTOMY     LEFT HEART CATH AND CORONARY ANGIOGRAPHY N/A 08/11/2017   Procedure: LEFT HEART CATH AND CORONARY ANGIOGRAPHY;  Surgeon: Martinique, Peter M, MD;  Location: [facility];  Service: Cardiovascular;  Laterality: N/A;   LEFT HEART CATHETERIZATION WITH CORONARY ANGIOGRAM N/A 04/07/2012   Procedure: LEFT HEART CATHETERIZATION WITH CORONARY ANGIOGRAM;  Surgeon: [name], MD;  Location: [facility];  Service: Cardiovascular;  Laterality: N/A;   LEFT HEART CATHETERIZATION WITH CORONARY ANGIOGRAM N/A 05/27/2014   Procedure: LEFT HEART CATHETERIZATION WITH CORONARY ANGIOGRAM;  Surgeon: [name], MD; LAD 99% ISR, CFX 50-60%, RCA (dominant) no sig dz, EF 60%   LOWER EXTREMITY ANGIOGRAM  05/17/11   directional atherectomy to the prox L SFA using a LX Man TurboHawk, ballooned with a [place] balloon    ORIF ANKLE FRACTURE  07/09/2012   Procedure: OPEN REDUCTION INTERNAL FIXATION (ORIF) ANKLE FRACTURE;  Surgeon: Meredith Pel, MD;  [facility];  Service: Orthopedics;  Laterality: Left;  open reduction internal fixation trimalleolar ankle fracture medial malleolous fixation   PERCUTANEOUS CORONARY STENT INTERVENTION (PCI-S) N/A 04/07/2012   Procedure:  PERCUTANEOUS CORONARY STENT INTERVENTION (PCI-S);  Surgeon: [name], MD;  Location: [facility] CATH LAB;  Service: Cardiovascular;  Laterality: N/A;   PERCUTANEOUS CORONARY STENT INTERVENTION (PCI-S)  05/27/2014   Procedure: PERCUTANEOUS CORONARY STENT INTERVENTION (PCI-S);  Surgeon: [name], MD; 3 mm x 12 mm long Xience Xpedition DES to the proximal LAD   SAVORY DILATION N/A 06/13/2015   Procedure: SAVORY DILATION;  Surgeon: Carol Ada, MD;  [facility];  Service: Endoscopy;  Laterality: N/A;   SAVORY DILATION N/A 04/07/2018   Procedure: SAVORY DILATION;  Surgeon: Carol Ada, MD;  [facility];  Service: Endoscopy;  Laterality: N/A;   TEE WITHOUT CARDIOVERSION N/A 08/17/2017   Procedure: TRANSESOPHAGEAL ECHOCARDIOGRAM (TEE);  Surgeon: Melrose Nakayama, MD;  Location: [facility];  Service: Open Heart Surgery;  Laterality: N/A;   TEE WITHOUT CARDIOVERSION N/A 07/16/2021   Procedure: TRANSESOPHAGEAL ECHOCARDIOGRAM (TEE);  Surgeon: Werner Lean, MD;  Location: [facility]OS[facility];  Service: Cardiovascular;  Laterality: N/A;   TONSILLECTOMY     TOTAL KNEE ARTHROPLASTY Left 2003   TOTAL KNEE REVISION Left 11/05/2013   Procedure: LEFT TOTAL KNEE RESECTION;  Surgeon: Mauri Pole, MD;  [facility];  Service: Orthopedics;  Laterality: Left;   TOTAL KNEE REVISION Left 2007   opened in 2006 and cleaned out, 2007 revision   TOTAL KNEE REVISION Left 12/31/2013   Procedure: RE-INPLANTATION LEFT TOTAL KNEE ;  Surgeon: Mauri Pole, MD;  Location: [facility];  Service: Orthopedics;  Laterality: Left;   VIDEO ASSISTED THORACOSCOPY (VATS)/ LOBECTOMY  01/2009   Social History   Occupational History   Occupation: Retired  Tobacco Use   Smoking status: Former    Packs/day: 1.00    Years: 20.00    Total pack years: 20.00    Types: Cigarettes    Quit date: 09/27/1984    Years since quitting: 37.9   Smokeless tobacco: Never  Vaping Use   Vaping Use: Never used   Substance and Sexual Activity   Alcohol use: No   Drug use: No   Sexual activity: Not on file

## 2022-09-07 ENCOUNTER — Ambulatory Visit: Payer: HMO | Attending: Cardiovascular Disease

## 2022-09-07 DIAGNOSIS — I480 Paroxysmal atrial fibrillation: Secondary | ICD-10-CM | POA: Diagnosis not present

## 2022-09-07 DIAGNOSIS — Z7901 Long term (current) use of anticoagulants: Secondary | ICD-10-CM | POA: Diagnosis not present

## 2022-09-07 DIAGNOSIS — Z5181 Encounter for therapeutic drug level monitoring: Secondary | ICD-10-CM | POA: Diagnosis not present

## 2022-09-07 LAB — POCT INR: INR: 3 (ref 2.0–3.0)

## 2022-09-07 NOTE — Patient Instructions (Signed)
Continue 1 tablet daily except for 1/2 tablet on Monday, Wednesday and Friday. Recheck INR in 6 weeks. [facility] [phone]

## 2022-09-08 ENCOUNTER — Encounter (HOSPITAL_BASED_OUTPATIENT_CLINIC_OR_DEPARTMENT_OTHER): Payer: HMO | Admitting: Internal Medicine

## 2022-09-08 DIAGNOSIS — E11622 Type 2 diabetes mellitus with other skin ulcer: Secondary | ICD-10-CM | POA: Diagnosis not present

## 2022-09-08 NOTE — Progress Notes (Signed)
Brandi [name], TENNIS [name] ([id number]) [id number] Page 1 of 3 Visit Report for 09/08/2022 Arrival Information Details Patient Name: Date of Service: Brandi [name], Brandi [name]. 09/08/2022 10:00 [name] Medical Record Number: [id number] Patient Account Number: [id number] Date of Birth/Sex: Treating RN: December 07, 1950 (71 y.o. F) Primary Care Rendon Howell: Georgianne Fick Other Clinician: Referring Ashwath Lasch: Treating Eilidh Marcano/Extender: [place] Callas in Treatment: 13 Visit Information History Since Last Visit Added or deleted any medications: No Patient Arrived: Wheel Chair Any new allergies or adverse reactions: No Arrival Time: 10:11 Had [name] fall or experienced change in No Accompanied By: son activities of daily living that may affect Transfer Assistance: None risk of falls: Patient Identification Verified: Yes Signs or symptoms of abuse/neglect since last visito No Secondary Verification Process Completed: Yes Hospitalized since last visit: No Patient Requires Transmission-Based No Implantable device outside of the clinic excluding No Precautions: cellular tissue based products placed in the center Patient Has Alerts: Yes since last visit: Patient Alerts: VVS ABI Land[place] 10/23/21 Has Compression in Place as Prescribed: Yes TBI L 0.29 R 0.28 Pain Present Now: Yes Electronic Signature(s) Signed: 09/08/2022 12:00:46 PM By: Thayer Dallas Entered By: Thayer Dallas on 09/08/2022 10:14:22 -------------------------------------------------------------------------------- Encounter Discharge Information Details Patient Name: Date of Service: Brandi [name]. 09/08/2022 10:00 [name] Medical Record Number: [id number] Patient Account Number: [id number] Date of Birth/Sex: Treating RN: 1951/03/29 (71 y.o. F) Primary Care Kirby Argueta: Georgianne Fick Other Clinician: Thayer Dallas Referring Garima Chronis: Treating Jovanne Riggenbach/Extender: [place] Callas in Treatment: 13 Encounter Discharge Information Items Discharge Condition: Stable Ambulatory Status: Wheelchair Discharge Destination: Home Transportation: Private Auto Accompanied By: son Schedule Follow-up Appointment: Yes Clinical Summary of Care: Electronic Signature(s) Signed: 09/08/2022 12:00:46 PM By: Thayer Dallas Entered By: Thayer Dallas on 09/08/2022 11:53:05 Fuller Mandril [name] ([id number]) [id number] Page 2 of 3 -------------------------------------------------------------------------------- Patient/Caregiver Education Details Patient Name: Date of Service: Brandi [name], Brandi [name]. 12/13/2023andnbsp10:00 [name] Medical Record Number: [id number] Patient Account Number: [id number] Date of Birth/Gender: Treating RN: 1951-06-04 (71 y.o. F) Primary Care Physician: Georgianne Fick Other Clinician: Thayer Dallas Referring Physician: Treating Physician/Extender: [place] Callas in Treatment: 13 Education Assessment Education Provided To: Patient Education Topics Provided Electronic Signature(s) Signed: 09/08/2022 12:00:46 PM By: Thayer Dallas Entered By: Thayer Dallas on 09/08/2022 11:52:46 -------------------------------------------------------------------------------- Wound Assessment Details Patient Name: Date of Service: Brandi [name]. 09/08/2022 10:00 [name] Medical Record Number: [id number] Patient Account Number: [id number] Date of Birth/Sex: Treating RN: 1951/09/04 (71 y.o. F) Primary Care Brayten Komar: Georgianne Fick Other Clinician: Referring Alverto Shedd: Treating Nylee Barbuto/Extender: [place] Callas in Treatment: 13 Wound Status Wound Number: [id number] Primary Etiology: Lymphedema Wound Location: Right, Posterior Lower Leg Wound Status: Open Wounding Event: Blister Date Acquired: 05/26/2022 Weeks Of Treatment: 13 Clustered Wound: No Wound  Measurements Length: (cm) Width: (cm) Depth: (cm) Area: (cm) Volume: (cm) 0 % Reduction in Area: 100% 0 % Reduction in Volume: 100% 0 0 0 Wound Description Classification: Full Thickness Without Exposed Support S Exudate Amount: None Present tructures Periwound Skin Texture Texture Color No Abnormalities Noted: No No Abnormalities Noted: No Moisture No Abnormalities Noted: No Treatment Notes Wound #3R (Lower Leg) Wound Laterality: Right, Posterior EEVEE, Brandi [name] ([id number]) [id number] Page 3 of 3 Cleanser Soap and Water Discharge Instruction: May shower and wash wound with dial antibacterial soap and water prior to dressing change. Peri-Wound Care Topical Primary Dressing KerraCel Ag Gelling Fiber Dressing, 4x5 in (silver alginate) Discharge Instruction: Apply silver alginate to  wound bed as instructed Secondary Dressing ABD Pad, 5x9 Discharge Instruction: Apply over primary dressing as directed. Secured With Compression Wrap Unnaboot w/Calamine, 4x10 (in/yd) Discharge Instruction: Apply Unnaboot as directed. Compression Stockings Add-Ons Electronic Signature(s) Signed: 09/08/2022 12:00:46 PM By: Thayer Dallas Entered By: Thayer Dallas on 09/08/2022 11:52:00 -------------------------------------------------------------------------------- Vitals Details Patient Name: Date of Service: Brandi [name]. 09/08/2022 10:00 [name] Medical Record Number: [id number] Patient Account Number: [id number] Date of Birth/Sex: Treating RN: 1951/04/05 (71 y.o. F) Primary Care Blakely Gluth: Georgianne Fick Other Clinician: Referring Lynae Pederson: Treating Josemaria Brining/Extender: [place] Callas in Treatment: 13 Vital Signs Time Taken: 10:14 Temperature (F): 97.9 Height (in): 63 Pulse (bpm): 85 Weight (lbs): 284 Respiratory Rate (breaths/min): 18 Body Mass Index (BMI): 50.3 Blood Pressure (mmHg): 160/82 Reference  Range: 80 - 120 mg / dl Electronic Signature(s) Signed: 09/08/2022 12:00:46 PM By: Thayer Dallas Entered By: Thayer Dallas on 09/08/2022 10:16:21

## 2022-09-09 NOTE — Progress Notes (Signed)
[name], [name] ([id number]) [id number] Page 1 of 1 Visit Report for 09/08/2022 SuperBill Details Patient Name: [name], [name]. Date of Service: 09/08/2022 Medical Record Number: [id number] Patient Account Number: [id number] Date of Birth/Sex: Treating RN: 07-25-1951 (71 y.o. F) Primary Care Provider: [name] Other Clinician: Referring Provider: Treating Provider/Extender: [name] in Treatment: 13 Diagnosis Coding ICD-10 Codes Code Description I89.0 Lymphedema, not elsewhere classified I87.331 Chronic venous hypertension (idiopathic) with ulcer and inflammation of right lower extremity L97.821 Non-pressure chronic ulcer of other part of left lower leg limited to breakdown of skin L97.812 Non-pressure chronic ulcer of other part of right lower leg with fat layer exposed E11.622 Type 2 diabetes mellitus with other skin ulcer I73.89 Other specified peripheral vascular diseases I10 Essential (primary) hypertension Z79.01 Long term (current) use of anticoagulants Facility Procedures CPT4 Code Description Modifier Quantity [id number] - APPLY UNNA BOOT/PROFO BILATERAL 1 Electronic Signature(s) Signed: 09/08/2022 12:00:46 PM By: Erenest Blank Signed: 09/08/2022 5:47:10 PM By: Linton Ham MD Entered By: Erenest Blank on 09/08/2022 11:54:15

## 2022-09-10 ENCOUNTER — Ambulatory Visit: Payer: HMO

## 2022-09-15 ENCOUNTER — Encounter (HOSPITAL_BASED_OUTPATIENT_CLINIC_OR_DEPARTMENT_OTHER): Payer: HMO | Admitting: Physician Assistant

## 2022-09-15 DIAGNOSIS — E11622 Type 2 diabetes mellitus with other skin ulcer: Secondary | ICD-10-CM | POA: Diagnosis not present

## 2022-09-15 NOTE — Progress Notes (Signed)
[name] ([id number]) [id number] Page 1 of 7 Visit Report for 09/15/2022 Chief Complaint Document Details Patient Name: Date of Service: [name], [name]. 09/15/2022 10:30 [name] Medical Record Number: [id number] Patient Account Number: [id number] Date of Birth/Sex: Treating RN: 24-Feb-1951 (71 y.o. F) Primary Care Provider: [name] Other Clinician: Referring Provider: Treating Provider/Extender: [name], [name] in Treatment: 14 Information Obtained from: Patient Chief Complaint Right LE Ulcers Electronic Signature(s) Signed: 09/15/2022 11:01:18 AM By: Worthy Keeler PA-C Entered By: Worthy Keeler on 09/15/2022 11:01:18 -------------------------------------------------------------------------------- HPI Details Patient Name: Date of Service: [name]. 09/15/2022 10:30 [name] Medical Record Number: [id number] Patient Account Number: [id number] Date of Birth/Sex: Treating RN: 09-02-1951 (71 y.o. F) Primary Care Provider: [name] Other Clinician: Referring Provider: Treating Provider/Extender: [name], [name] in Treatment: 14 History of Present Illness HPI Description: 08/05/2021 upon evaluation today patient has bilateral wounds on her lower extremities. She has been tolerating the dressing changes without complication at home but unfortunately is not really seeing signs of improvement like she would like to see. She does have what appears to be bilateral lymphedema due to chronic venous insufficiency most likely. She is on fluid pills and takes them regularly. She really does not sit with her legs elevated much she has [name] recliner but again does not use that often. I think she should be using this more to get her feet up. Subsequently she tells me this has been going on for several weeks to even months. She finally decided to make [name] referral herself to come in to  be seen. Patient does have [name] history of diabetes mellitus type 2, some evidence of potential peripheral vascular disease as she is noncompressible with her ABIs in the office today, hypertension, and she is on long-term anticoagulant therapy. 08/12/2021 upon evaluation today patient's wounds appear to be doing well in regard to her legs currently. Actually very pleased with where things stand and I do not see any signs of active infection which is great news. No fevers, chills, nausea, vomiting, or diarrhea. 08/19/2021 upon evaluation today patient appears to be doing well with regard to her legs. Everything actually appears to be healing quite nicely with doing extremely well. Fortunately there does not appear to be any signs of active infection which is great news. Readmission: 06-09-2022 upon evaluation today patient appears for reevaluation in the clinic concerning issues that she has been having with her leg. This is actually her right leg at this time though when I saw her previously was both legs. Fortunately there does not appear to be any evidence of active infection at this time which is great news. No fevers, chills, nausea, vomiting, or diarrhea. Her past medical history really has not changed significantly since last time I saw her. I did discuss with her today how she has been doing with wearing her compression socks she tells me she really has not been wearing those because "she cannot get them on her son is not able to help. She tells me that he works at night and by the time he comes and goes to bed she is not up yet and then she wakes up later in the past do not cross. I talked her today about actually having [name] time later in the day when he could help with getting the socks on and that is [name] possibility. With that being said she currently states that she had not  thought through that completely I am not sure if that something she has been worked out or not. 06-16-2022 upon evaluation  today patient appears to be doing well currently in regard to her wounds all things considered. I am seeing excellent signs of improvement I think were getting very close to complete resolution which is great news and overall I do not see anything that seems to be worsening significantly also great news. In general I think we are on the right track here. She does have [name] follow-up with vein and vascular next Friday. 06-23-2022 upon evaluation today the wound on patient's right leg actually appears to be healed unfortunately she has an area on the left leg which is still open. [name] ([id number]) [id number] Page 2 of 7 I am thinking that this may be brand-new skin on the right leg so I think we probably should still continue to wrap her for at least 1 more week and I discussed that with her today. Otherwise I think that we are on the right track for the most part. 07-07-2022 upon evaluation today patient appears to be doing well currently in regard to her wound she actually appears to be healed which is good news. Fortunately I do not see any signs of infection. The downside is that she actually did have her arterial studies that showed that on 06-25-2022 that her TBI on the right was 0.30 and on the left was 0.25. This is obviously very low and I think that that is going to continue to be an ongoing issue and something has to be monitored for her. I discussed that with the patient today. I did review Dr. Unk Lightning note had vascular surgery as well and he noted that to be honest right now being that she is not [name] great surgical candidate he would not want to proceed with any further intervention though she does not feel her stay healed then the neck step would be to do endovascular intervention. 10/18; the patient comes in without her juxta lite stockings on since yesterday. She said they were too tight and were cutting into her upper posterior calf. She took them off  but did not bring them in today. She does not have an open wound however her edema control is very poor and she is certainly at risk for reopening 11/8; patient was discharged 2 weeks ago in bilateral juxta lite stockings. Apparently she noted drainage coming out of both her legs about [name] week ago she has not been wearing the stockings since. She has reopening of the area on the right posterior calf and 2 small areas anteriorly and laterally on the left. Most substantial areas on the right posterior calf 08-11-2022 upon evaluation today patient appears to be doing well currently in regard to her wound. She has been improving since last week. The good news is she does not show any signs of active infection at this time which is excellent. She does seem to be doing better especially in regard to the left leg which is completely healed the right leg unfortunately is still open and this is still giving her trouble. 09-01-2022 upon evaluation today patient's right leg actually appears to be healed the left leg that she was post to be using her Velcro compression wrap on she did not over the past week and subsequently this has started to weep [name] little bit though there are any actual wounds open at this point. I had [name] discussion with the patient about  this today. I explained that she is going to absolutely need to wear her compression if she is can have any chance of keeping this closed and not ended up just back-and-forth here in the clinic. She voiced understanding. With that being said I do think that she needs to be wrapped on both legs today to allow the areas to toughen up regularly with wraps which seem to be doing [name] good job. 09-15-2022 upon evaluation today patient actually appears to be doing excellent in fact she is completely healed based on what I am seeing. I do not see any signs of infection locally nor systemically which is great news. No fevers, chills, nausea, vomiting, or  diarrhea. Electronic Signature(s) Signed: 09/15/2022 2:40:05 PM By: Worthy Keeler PA-C Previous Signature: 09/15/2022 1:08:04 PM Version By: Worthy Keeler PA-C Entered By: Worthy Keeler on 09/15/2022 14:40:05 -------------------------------------------------------------------------------- Physical Exam Details Patient Name: Date of Service: ELDENE, PLOCHER [name]. 09/15/2022 10:30 [name] Medical Record Number: [id number] Patient Account Number: [id number] Date of Birth/Sex: Treating RN: 1951-08-26 (71 y.o. F) Primary Care Provider: [name] Other Clinician: Referring Provider: Treating Provider/Extender: [name], [name] in Treatment: 36 Constitutional Well-nourished and well-hydrated in no acute distress. Respiratory normal breathing without difficulty. Psychiatric this patient is able to make decisions and demonstrates good insight into disease process. Alert and Oriented x 3. pleasant and cooperative. Notes Upon inspection patient's wound again showed signs of complete resolution on both lower extremities I do not see anything open at this point and overall I am extremely pleased with where things stand currently. No fevers, chills, nausea, vomiting, or diarrhea. Electronic Signature(s) Signed: 09/15/2022 2:40:30 PM By: Worthy Keeler PA-C Previous Signature: 09/15/2022 1:08:18 PM Version By: Worthy Keeler PA-C Entered By: Worthy Keeler on 09/15/2022 14:40:30 [name] ([id number]) [id number] Page 3 of 7 -------------------------------------------------------------------------------- Physician Orders Details Patient Name: Date of Service: TERIE, LEAR [name]. 09/15/2022 10:30 [name] Medical Record Number: [id number] Patient Account Number: [id number] Date of Birth/Sex: Treating RN: September 17, 1951 (71 y.o. [name], [name] Primary Care Provider: [name] Other Clinician: Referring  Provider: Treating Provider/Extender: Angelica Pou in Treatment: [phone] Verbal / Phone Orders: No Diagnosis Coding ICD-10 Coding Code Description I89.0 Lymphedema, not elsewhere classified I87.331 Chronic venous hypertension (idiopathic) with ulcer and inflammation of right lower extremity L97.821 Non-pressure chronic ulcer of other part of left lower leg limited to breakdown of skin L97.812 Non-pressure chronic ulcer of other part of right lower leg with fat layer exposed E11.622 Type 2 diabetes mellitus with other skin ulcer I73.89 Other specified peripheral vascular diseases I10 Essential (primary) hypertension Z79.01 Long term (current) use of anticoagulants Discharge From [facility] Discharge from [facility] Edema Control - Lymphedema / SCD / Other Elevate legs to the level of the heart or above for 30 minutes daily and/or when sitting, [name] frequency of: Avoid standing for long periods of time. Patient to wear own compression stockings every day. - Apply in the morning and remove at night!!! Electronic Signature(s) Signed: 09/15/2022 5:39:23 PM By: Rhae Hammock RN Signed: 09/15/2022 6:40:18 PM By: Worthy Keeler PA-C Entered By: Rhae Hammock on 09/15/2022 11:17:41 -------------------------------------------------------------------------------- Problem List Details Patient Name: Date of Service: [name]. 09/15/2022 10:30 [name] Medical Record Number: [id number] Patient Account Number: [id number] Date of Birth/Sex: Treating RN: [birth year] (71 y.o. F) Primary Care Provider: [name] Other Clinician: Referring Provider: Treating Provider/Extender: Joaquim Lai  III, Boyce Medici, [name] in Treatment: 14 Active Problems ICD-10 Encounter Code Description Active Date MDM Diagnosis I89.0 Lymphedema, not elsewhere classified 06/09/2022 No Yes I87.331 Chronic venous hypertension (idiopathic) with ulcer and  inflammation of right 06/09/2022 No Yes lower extremity L97.821 Non-pressure chronic ulcer of other part of left lower leg limited to breakdown 08/04/2022 No Yes of skin HELINA, HULLUM [name] ([id number]) [phone].pdf Page 4 of 7 [phone] Non-pressure chronic ulcer of other part of right lower leg with fat layer 06/09/2022 No Yes exposed E11.622 Type 2 diabetes mellitus with other skin ulcer 06/09/2022 No Yes I73.89 Other specified peripheral vascular diseases 06/09/2022 No Yes I10 Essential (primary) hypertension 06/09/2022 No Yes Z79.01 Long term (current) use of anticoagulants 06/09/2022 No Yes Inactive Problems Resolved Problems Electronic Signature(s) Signed: 09/15/2022 11:01:12 AM By: Worthy Keeler PA-C Entered By: Worthy Keeler on 09/15/2022 11:01:12 -------------------------------------------------------------------------------- Progress Note Details Patient Name: Date of Service: [name]. 09/15/2022 10:30 [name] Medical Record Number: [id number] Patient Account Number: [id number] Date of Birth/Sex: Treating RN: 1951-09-23 (70 y.o. F) Primary Care Provider: [name] Other Clinician: Referring Provider: Treating Provider/Extender: Angelica Pou in Treatment: 14 Subjective Chief Complaint Information obtained from Patient Right LE Ulcers History of Present Illness (HPI) 08/05/2021 upon evaluation today patient has bilateral wounds on her lower extremities. She has been tolerating the dressing changes without complication at home but unfortunately is not really seeing signs of improvement like she would like to see. She does have what appears to be bilateral lymphedema due to chronic venous insufficiency most likely. She is on fluid pills and takes them regularly. She really does not sit with her legs elevated much she has [name] recliner but again does not use that often. I think she should be using this more to  get her feet up. Subsequently she tells me this has been going on for several weeks to even months. She finally decided to make [name] referral herself to come in to be seen. Patient does have [name] history of diabetes mellitus type 2, some evidence of potential peripheral vascular disease as she is noncompressible with her ABIs in the office today, hypertension, and she is on long-term anticoagulant therapy. 08/12/2021 upon evaluation today patient's wounds appear to be doing well in regard to her legs currently. Actually very pleased with where things stand and I do not see any signs of active infection which is great news. No fevers, chills, nausea, vomiting, or diarrhea. 08/19/2021 upon evaluation today patient appears to be doing well with regard to her legs. Everything actually appears to be healing quite nicely with doing extremely well. Fortunately there does not appear to be any signs of active infection which is great news. Readmission: 06-09-2022 upon evaluation today patient appears for reevaluation in the clinic concerning issues that she has been having with her leg. This is actually her right leg at this time though when I saw her previously was both legs. Fortunately there does not appear to be any evidence of active infection at this time which is great news. No fevers, chills, nausea, vomiting, or diarrhea. Her past medical history really has not changed significantly since last time I saw her. I did discuss with her today how she has been doing with wearing her compression socks she tells me she really has not been wearing those because "she cannot get them on her son is not able to help. She tells me that he works at night and  by the time he comes and goes to bed she is not up yet and then she wakes up later in the past do not cross. I talked her today about actually having [name] time later in the day when he could help with getting the socks on and that is [name] possibility. With that being said  she currently states that she had not thought through that completely I am not sure if that something she has been worked out or not. FERNANDE, TREIBER [name] ([id number]) [id number] Page 5 of 7 06-16-2022 upon evaluation today patient appears to be doing well currently in regard to her wounds all things considered. I am seeing excellent signs of improvement I think were getting very close to complete resolution which is great news and overall I do not see anything that seems to be worsening significantly also great news. In general I think we are on the right track here. She does have [name] follow-up with vein and vascular next Friday. 06-23-2022 upon evaluation today the wound on patient's right leg actually appears to be healed unfortunately she has an area on the left leg which is still open. I am thinking that this may be brand-new skin on the right leg so I think we probably should still continue to wrap her for at least 1 more week and I discussed that with her today. Otherwise I think that we are on the right track for the most part. 07-07-2022 upon evaluation today patient appears to be doing well currently in regard to her wound she actually appears to be healed which is good news. Fortunately I do not see any signs of infection. The downside is that she actually did have her arterial studies that showed that on 06-25-2022 that her TBI on the right was 0.30 and on the left was 0.25. This is obviously very low and I think that that is going to continue to be an ongoing issue and something has to be monitored for her. I discussed that with the patient today. I did review Dr. Unk Lightning note had vascular surgery as well and he noted that to be honest right now being that she is not [name] great surgical candidate he would not want to proceed with any further intervention though she does not feel her stay healed then the neck step would be to do endovascular intervention. 10/18; the  patient comes in without her juxta lite stockings on since yesterday. She said they were too tight and were cutting into her upper posterior calf. She took them off but did not bring them in today. She does not have an open wound however her edema control is very poor and she is certainly at risk for reopening 11/8; patient was discharged 2 weeks ago in bilateral juxta lite stockings. Apparently she noted drainage coming out of both her legs about [name] week ago she has not been wearing the stockings since. She has reopening of the area on the right posterior calf and 2 small areas anteriorly and laterally on the left. Most substantial areas on the right posterior calf 08-11-2022 upon evaluation today patient appears to be doing well currently in regard to her wound. She has been improving since last week. The good news is she does not show any signs of active infection at this time which is excellent. She does seem to be doing better especially in regard to the left leg which is completely healed the right leg unfortunately is still open and this is still giving  her trouble. 09-01-2022 upon evaluation today patient's right leg actually appears to be healed the left leg that she was post to be using her Velcro compression wrap on she did not over the past week and subsequently this has started to weep [name] little bit though there are any actual wounds open at this point. I had [name] discussion with the patient about this today. I explained that she is going to absolutely need to wear her compression if she is can have any chance of keeping this closed and not ended up just back-and-forth here in the clinic. She voiced understanding. With that being said I do think that she needs to be wrapped on both legs today to allow the areas to toughen up regularly with wraps which seem to be doing [name] good job. 09-15-2022 upon evaluation today patient actually appears to be doing excellent in fact she is completely healed  based on what I am seeing. I do not see any signs of infection locally nor systemically which is great news. No fevers, chills, nausea, vomiting, or diarrhea. Objective Constitutional Well-nourished and well-hydrated in no acute distress. Vitals Time Taken: 11:00 AM, Height: 63 in, Weight: 284 lbs, BMI: 50.3, Temperature: 98.2 F, Pulse: 109 bpm, Respiratory Rate: 18 breaths/min, Blood Pressure: 157/77 mmHg. Respiratory normal breathing without difficulty. Psychiatric this patient is able to make decisions and demonstrates good insight into disease process. Alert and Oriented x 3. pleasant and cooperative. General Notes: Upon inspection patient's wound again showed signs of complete resolution on both lower extremities I do not see anything open at this point and overall I am extremely pleased with where things stand currently. No fevers, chills, nausea, vomiting, or diarrhea. Integumentary (Hair, Skin) Wound #3R status is Open. Original cause of wound was Blister. The date acquired was: 05/26/2022. The wound has been in treatment 14 weeks. The wound is located on the Right,Posterior Lower Leg. The wound measures 0cm length x 0cm width x 0cm depth; 0cm^2 area and 0cm^3 volume. There is no tunneling or undermining noted. There is [name] none present amount of drainage noted. There is no granulation within the wound bed. There is no necrotic tissue within the wound bed. The periwound skin appearance had no abnormalities noted for texture. The periwound skin appearance had no abnormalities noted for moisture. The periwound skin appearance had no abnormalities noted for color. Periwound temperature was noted as No Abnormality. Assessment Active Problems ICD-10 Lymphedema, not elsewhere classified Chronic venous hypertension (idiopathic) with ulcer and inflammation of right lower extremity Non-pressure chronic ulcer of other part of left lower leg limited to breakdown of skin Non-pressure chronic  ulcer of other part of right lower leg with fat layer exposed Type 2 diabetes mellitus with other skin ulcer Other specified peripheral vascular diseases Essential (primary) hypertension Long term (current) use of anticoagulants ZIYONNA, CHRISTNER [name] ([id number]) [id number] Page 6 of 7 Plan Discharge From [facility] Services: Discharge from [place] Edema Control - Lymphedema / SCD / Other: Elevate legs to the level of the heart or above for 30 minutes daily and/or when sitting, [name] frequency of: Avoid standing for long periods of time. Patient to wear own compression stockings every day. - Apply in the morning and remove at night!!! 1. Based on what I am seeing I do believe that the patient is doing excellent she is completely healed she does have juxta lites and she is ready for discharge. 2. I did advise that she needs to make sure that she is  wearing the juxta light compression wraps every day. She should have them on first thing in the morning when she gets up wearing throughout the day and not take them off until she goes to bed at night. We will see the patient back for follow-up visit as needed. Electronic Signature(s) Signed: 09/15/2022 2:41:58 PM By: Worthy Keeler PA-C Entered By: Worthy Keeler on 09/15/2022 14:41:58 -------------------------------------------------------------------------------- SuperBill Details Patient Name: Date of Service: [name]. 09/15/2022 Medical Record Number: [id number] Patient Account Number: [id number] Date of Birth/Sex: Treating RN: January 16, 1951 (71 y.o. [name], [name] Primary Care Provider: [name] Other Clinician: Referring Provider: Treating Provider/Extender: Angelica Pou in Treatment: 14 Diagnosis Coding ICD-10 Codes Code Description I89.0 Lymphedema, not elsewhere classified I87.331 Chronic venous hypertension (idiopathic) with ulcer and inflammation  of right lower extremity L97.821 Non-pressure chronic ulcer of other part of left lower leg limited to breakdown of skin L97.812 Non-pressure chronic ulcer of other part of right lower leg with fat layer exposed E11.622 Type 2 diabetes mellitus with other skin ulcer I73.89 Other specified peripheral vascular diseases I10 Essential (primary) hypertension Z79.01 Long term (current) use of anticoagulants Facility Procedures : CPT4 Code: [id number] Description: 99213 - WOUND CARE VISIT-LEV 3 EST PT Modifier: Quantity: 1 Physician Procedures : CPT4 Code Description Modifier [id number] - WC PHYS LEVEL 3 - EST PT ICD-10 Diagnosis Description I89.0 Lymphedema, not elsewhere classified I87.331 Chronic venous hypertension (idiopathic) with ulcer and inflammation of right lower extremity  L97.821 Non-pressure chronic ulcer of other part of left lower leg limited to breakdown of skin L97.812 Non-pressure chronic ulcer of other part of right lower leg with fat layer exposed Quantity: 1 Electronic Signature(s) Signed: 09/15/2022 2:42:19 PM By: Jacki Cones, Lailany [name] ([id number]) By: Irean Hong [phone].pdf Page 7 of 7 Signed: 09/15/2022 2:42:19 PM Entered By: Worthy Keeler on 09/15/2022 14:42:18

## 2022-09-22 NOTE — Progress Notes (Signed)
HISTORY AND PHYSICAL     CC:  follow up. Requesting Provider:  [name], MD  HPI: This is a 71 y.o. female who is here today for follow up for PAD.  Pt has hx of  CAD, PAD, chronic diastolic CHF, paroxysmal atrial fibrillation, type 2 diabetes on insulin, and morbid obesity presenting at the request of .[name], MD for bilateral lower extremity ulcerations on her calves.   Pt was last seen 06/25/2022 by Dr. Virl Cagey and at that time, she had bilateral calf wounds that were compressed with unna boots.  She developed these superficial ulcerations after scratching areas that were itching on her calf.   She had significant edema due to her chronic diastolic heart failure.  On ABI, she did have decrease in toe pressure bilaterally but did not have any toe wounds or rest pain. She had mixed arterial venous disease and lipedema from morbid obesity.  She was instructed to continue compression with unna boots and elevation.she was not an open surgical candidate.  A discussion to be had for possible endovascular treatment if the wounds worsened.   The pt returns today for follow up.  She states that she had been going to the wound center every Wednesday and seeing Jeri Cos, PA for [employer] changes.  She states they took the unna boots off the Wednesday before Christmas as she had healed her wounds.  She states that her legs were itching and she scratched them and now she has superficial wounds with weeping from them.  She states she is not really able to elevate her legs.  She has thigh pain and is waiting to get an xray of her femur through ortho.  She also has bilateral knee pain.  She denies any pain in her feet.   She states that her legs felt better with the unna boots on.  The pt is on a statin for cholesterol management.    The pt is not on an aspirin.    Other AC:  coumadin The pt is on diuretic, ACEI, BB for hypertension.  The pt does  have diabetes. Tobacco hx:   former    Past Medical History:  Diagnosis Date   Arthritis    "knees" (02/05/2016)   Cancer ([place]) 5/10   LUL Vats    CHF (congestive heart failure) ([place])    Coronary artery disease    GERD (gastroesophageal reflux disease)    tx meds   H/O hiatal hernia    History of blood transfusion "several"   last april 2015 s/p knee surgery (02/05/2016)   Hypercholesteremia    Hypertension    Lung cancer ([place])    2010, surgery 18% left lung no radiation or chemo   Non-STEMI (non-ST elevated myocardial infarction) ([place]) 04/07/2012   See cath results below   Paroxysmal atrial fibrillation ([place]) 04/07/2012   On Warfarin   Peripheral vascular disease ([place]) 8/12   Lt SFA PTA   Presence of stent in LAD coronary artery 04/07/2012   Xience Expedition DES 2.75 mm x 18 mm (dilated to 3.0 mm)   Sleep apnea    does not wear CPAP   Type II diabetes mellitus (HCC)    insulin dependent    Past Surgical History:  Procedure Laterality Date   ABDOMINAL HYSTERECTOMY  1990   BREAST EXCISIONAL BIOPSY Right    CARDIAC CATHETERIZATION  11/04/2008   patent RCA, LM, and Circ, nl EF   CARDIAC CATHETERIZATION  02/20/2002   patent coronaries  with the only abnormality being a smooth luminla irregularity in the mid intermediate ramus branch no felt to be hemodynamically significant, nl LV   CARDIAC CATHETERIZATION N/A 02/05/2016   Procedure: Left Heart Cath and Coronary Angiography;  Surgeon: Leonie Man, MD;  Location: [facility];  Service: Cardiovascular;  Laterality: N/A;   CARDIAC CATHETERIZATION N/A 02/05/2016   Procedure: Coronary Stent Intervention;  Surgeon: Leonie Man, MD;  Location: [facility];  Service: Cardiovascular;  Laterality: N/A;   CARDIOVERSION N/A 07/16/2021   Procedure: CARDIOVERSION;  Surgeon: Werner Lean, MD;  Location: [facility];  Service: Cardiovascular;  Laterality: N/A;   CATARACT EXTRACTION W/ INTRAOCULAR LENS  IMPLANT, BILATERAL  2013   CORONARY  ANGIOPLASTY WITH STENT PLACEMENT  04/07/2012   Xience Expedition DES 2.8m x 18 mm (dilated to 3.0 mm) to the prox LAD    CORONARY ANGIOPLASTY WITH STENT PLACEMENT  2013; 2015   CORONARY ARTERY BYPASS GRAFT N/A 08/17/2017   Procedure: CORONARY ARTERY BYPASS GRAFTING (CABG) TIMES 1 USING LEFT INTERNAL MAMMARY ARTERY;  Surgeon: HMelrose Nakayama MD;  [facility]  Service: Open Heart Surgery;  Laterality: N/A;   DILATION AND CURETTAGE OF UTERUS  <1990   ESOPHAGOGASTRODUODENOSCOPY N/A 05/23/2015   Procedure: ESOPHAGOGASTRODUODENOSCOPY (EGD);  Surgeon: PCarol Ada MD;  [facility];  Service: Endoscopy;  Laterality: N/A;   ESOPHAGOGASTRODUODENOSCOPY (EGD) WITH PROPOFOL N/A 06/13/2015   Procedure: ESOPHAGOGASTRODUODENOSCOPY (EGD) WITH PROPOFOL;  Surgeon: PCarol Ada MD;  [facility];  Service: Endoscopy;  Laterality: N/A;   ESOPHAGOGASTRODUODENOSCOPY (EGD) WITH PROPOFOL N/A 04/07/2018   Procedure: ESOPHAGOGASTRODUODENOSCOPY (EGD) WITH PROPOFOL;  Surgeon: HCarol Ada MD;  [facility];  Service: Endoscopy;  Laterality: N/A;  fluoroscopy is needed   FRACTURE SURGERY     HAMMER TOE SURGERY Bilateral 1993   with screws, on screw removed   JOINT REPLACEMENT     KNEE ARTHROSCOPY Bilateral    left x2, right x1   LAPAROSCOPIC CHOLECYSTECTOMY     LEFT HEART CATH AND CORONARY ANGIOGRAPHY N/A 08/11/2017   Procedure: LEFT HEART CATH AND CORONARY ANGIOGRAPHY;  Surgeon: JMartinique Peter M, MD;  [facility];  Service: Cardiovascular;  Laterality: N/A;   LEFT HEART CATHETERIZATION WITH CORONARY ANGIOGRAM N/A 04/07/2012   Procedure: LEFT HEART CATHETERIZATION WITH CORONARY ANGIOGRAM;  Surgeon: JLorretta Harp MD;  [facility];  Service: Cardiovascular;  Laterality: N/A;   LEFT HEART CATHETERIZATION WITH CORONARY ANGIOGRAM N/A 05/27/2014   Procedure: LEFT HEART CATHETERIZATION WITH CORONARY ANGIOGRAM;  Surgeon: JLorretta Harp MD; LAD 99% ISR,  CFX 50-60%, RCA (dominant) no sig dz, EF 60%   LOWER EXTREMITY ANGIOGRAM  05/17/11   directional atherectomy to the prox L SFA using a LX Man TurboHawk, ballooned with a [place] balloon    ORIF ANKLE FRACTURE  07/09/2012   Procedure: OPEN REDUCTION INTERNAL FIXATION (ORIF) ANKLE FRACTURE;  Surgeon: GMeredith Pel MD;  [facility];  Service: Orthopedics;  Laterality: Left;  open reduction internal fixation trimalleolar ankle fracture medial malleolous fixation   PERCUTANEOUS CORONARY STENT INTERVENTION (PCI-S) N/A 04/07/2012   Procedure: PERCUTANEOUS CORONARY STENT INTERVENTION (PCI-S);  Surgeon: JLorretta Harp MD;  [facility];  Service: Cardiovascular;  Laterality: N/A;   PERCUTANEOUS CORONARY STENT INTERVENTION (PCI-S)  05/27/2014   Procedure: PERCUTANEOUS CORONARY STENT INTERVENTION (PCI-S);  Surgeon: JLorretta Harp MD; 3 mm x 12 mm long Xience Xpedition DES to the proximal LAD   SAVORY  DILATION N/A 06/13/2015   Procedure: SAVORY DILATION;  Surgeon: Carol Ada, MD;  [facility];  Service: Endoscopy;  Laterality: N/A;   SAVORY DILATION N/A 04/07/2018   Procedure: SAVORY DILATION;  Surgeon: Carol Ada, MD;  [facility];  Service: Endoscopy;  Laterality: N/A;   TEE WITHOUT CARDIOVERSION N/A 08/17/2017   Procedure: TRANSESOPHAGEAL ECHOCARDIOGRAM (TEE);  Surgeon: Melrose Nakayama, MD;  Location: [facility];  Service: Open Heart Surgery;  Laterality: N/A;   TEE WITHOUT CARDIOVERSION N/A 07/16/2021   Procedure: TRANSESOPHAGEAL ECHOCARDIOGRAM (TEE);  Surgeon: Werner Lean, MD;  Location[facility];  Service: Cardiovascular;  Laterality: N/A;   TONSILLECTOMY     TOTAL KNEE ARTHROPLASTY Left 2003   TOTAL KNEE REVISION Left 11/05/2013   Procedure: LEFT TOTAL KNEE RESECTION;  Surgeon: Mauri Pole, MD;  [facility];  Service: Orthopedics;  Laterality: Left;   TOTAL KNEE REVISION Left 2007   opened in 2006 and cleaned out, 2007 revision    TOTAL KNEE REVISION Left 12/31/2013   Procedure: RE-INPLANTATION LEFT TOTAL KNEE ;  Surgeon: Mauri Pole, MD;  [facility];  Service: Orthopedics;  Laterality: Left;   VIDEO ASSISTED THORACOSCOPY (VATS)/ LOBECTOMY  01/2009    Allergies  Allergen Reactions   Levofloxacin Itching   Morphine And Related Itching    Pt reports oxycodone history with no allergy.    Current Outpatient Medications  Medication Sig Dispense Refill   Blood Glucose Monitoring Suppl ([place]) w/Device KIT      Continuous Blood Gluc Receiver (FREESTYLE LIBRE 2 READER) DEVI See admin instructions.     furosemide (LASIX) 40 MG tablet Take 1 tablet (40 mg total) by mouth 3 (three) times a week. TAKE Monday Wednesday AND FRIDAY 36 tablet 3   insulin NPH-regular Human (70-30) 100 UNIT/ML injection Inject 20-62 Units into the skin 2 (two) times daily with a meal. 62 units in the morning and 20 units at bedtme     Lancets (ONETOUCH DELICA PLUS [id number]) MISC Apply topically.     lisinopril-hydrochlorothiazide (PRINZIDE,ZESTORETIC) 20-25 MG per tablet Take 1 tablet by mouth daily.     metoCLOPramide (REGLAN) 10 MG tablet Take 10 mg by mouth 2 (two) times daily. Per patient     metoprolol succinate (TOPROL-XL) 50 MG 24 hr tablet TAKE 1 TABLET BY MOUTH EVERY DAY 30 tablet 0   metoprolol tartrate (LOPRESSOR) 25 MG tablet Take 1 table as needed for heart rate above 120bpm. 30 tablet 6   ONETOUCH VERIO test strip 1 each 2 (two) times daily.     pantoprazole (PROTONIX) 40 MG tablet Take 1 tablet (40 mg total) by mouth daily. 90 tablet 3   simvastatin (ZOCOR) 40 MG tablet TAKE 1 TABLET BY MOUTH DAILY AT 6 PM. 90 tablet 3   spironolactone (ALDACTONE) 25 MG tablet Take 1 tablet (25 mg total) by mouth 3 (three) times a week. TAKE Monday Wednesday AND Friday 36 tablet 3   traMADol (ULTRAM) 50 MG tablet Take 1 tablet (50 mg total) by mouth every 12 (twelve) hours as needed. 40 tablet 0   Vitamin D,  Cholecalciferol, 1000 units TABS Take 1,000 Units by mouth every morning.      warfarin (COUMADIN) 7.5 MG tablet TAKE 1/2 TO 1 TABLET DAILY AS DIRECTED BY COUMADIN CLINIC 90 tablet 1   No current facility-administered medications for this visit.    Family History  Problem Relation Age of Onset   Hypertension Mother  Coronary artery disease Brother    Hypertension Sister     Social History   Socioeconomic History   Marital status: Single    Spouse name: Not on file   Number of children: Not on file   Years of education: Not on file   Highest education level: Not on file  Occupational History   Occupation: Retired  Tobacco Use   Smoking status: Former    Packs/day: 1.00    Years: 20.00    Total pack years: 20.00    Types: Cigarettes    Quit date: 09/27/1984    Years since quitting: 38.0   Smokeless tobacco: Never  Vaping Use   Vaping Use: Never used  Substance and Sexual Activity   Alcohol use: No   Drug use: No   Sexual activity: Not on file  Other Topics Concern   Not on file  Social History Narrative   Patient lives alone.   Social Determinants of Health   Financial Resource Strain: Not on file  Food Insecurity: No Food Insecurity (05/07/2020)   Hunger Vital Sign    Worried About Running Out of Food in the Last Year: Never true    Ran Out of Food in the Last Year: Never true  Transportation Needs: No Transportation Needs (05/07/2020)   PRAPARE - Hydrologist (Medical): No    Lack of Transportation (Non-Medical): No  Physical Activity: Not on file  Stress: Stress Concern Present (11/14/2019)   [place]    Feeling of Stress : Very much  Social Connections: Not on file  Intimate Partner Violence: Not on file     REVIEW OF SYSTEMS:   _0  denotes positive finding, _1  denotes negative finding Cardiac  Comments:  Chest pain or chest pressure:    Shortness of  breath upon exertion:    Short of breath when lying flat:    Irregular heart rhythm:        Vascular    Pain in calf, thigh, or hip brought on by ambulation:    Pain in feet at night that wakes you up from your sleep:     Blood clot in your veins:    Leg swelling:  x       Pulmonary    Oxygen at home:    Productive cough:     Wheezing:         Neurologic    Sudden weakness in arms or legs:     Sudden numbness in arms or legs:     Sudden onset of difficulty speaking or slurred speech:    Temporary loss of vision in one eye:     Problems with dizziness:         Gastrointestinal    Blood in stool:     Vomited blood:         Genitourinary    Burning when urinating:     Blood in urine:        Psychiatric    Major depression:         Hematologic    Bleeding problems:    Problems with blood clotting too easily:        Skin    Rashes or ulcers: x       Constitutional    Fever or chills:      PHYSICAL EXAMINATION:  Today's Vitals   09/24/22 1419  BP: (!) 141/61  Pulse: 97  Resp:  20  Temp: 97.6 F (36.4 C)  TempSrc: Temporal  SpO2: 95%  Height: _0  (1.6 m)  PainSc: 8   PainLoc: Leg   Body mass index is 51.83 kg/m.   General:  WDWN in NAD; vital signs documented above Gait: Not observed HENT: WNL, normocephalic Pulmonary: normal non-labored breathing , without wheezing Cardiac: irregular HR Abdomen: obese Skin: without rashes Vascular Exam/Pulses: 2+ radial pulses bialterally Extremities: +BLE swelling/lymphedema; superficial ulcerations on posterior lower legs with weeping  Right leg   Left leg   Bilateral legs   Musculoskeletal: no muscle wasting or atrophy  Neurologic: A&O X 3 Psychiatric:  The pt has Normal affect.      ASSESSMENT/PLAN:: 71 y.o. female here for follow up for wound checks BLE   -pt had unna boots removed last week as her wounds had healed and came in today for follow up.  She has an area on each leg that is  superficial and weeping that she scratched.  We are putting unna boots back on today.  The wound care center is closed today, so we will see her back next Wednesday and change her unna boot and get her a follow up appt with them there after.  We will also see her back in about 6-8 weeks as well to check her wounds.  -Dr. Virl Cagey had discussed possibly doing arteriogram if wounds do not heal.  Her other wounds had healed.  -encouraged her to elevate her legs if she is able.    Leontine Locket, [facility] [phone]  Clinic MD:   Stanford Breed on call MD

## 2022-09-23 ENCOUNTER — Ambulatory Visit
Admission: RE | Admit: 2022-09-23 | Discharge: 2022-09-23 | Disposition: A | Discharge status: Home / Self Care | Payer: HMO | Source: Ambulatory Visit | Attending: Internal Medicine | Admitting: Internal Medicine

## 2022-09-23 DIAGNOSIS — Z1231 Encounter for screening mammogram for malignant neoplasm of breast: Secondary | ICD-10-CM

## 2022-09-24 ENCOUNTER — Ambulatory Visit: Payer: HMO | Admitting: Physician Assistant

## 2022-09-24 VITALS — BP 141/61 | HR 97 | Temp 97.6°F | Resp 20 | Ht 63.0 in

## 2022-09-24 DIAGNOSIS — I872 Venous insufficiency (chronic) (peripheral): Secondary | ICD-10-CM | POA: Diagnosis not present

## 2022-09-28 ENCOUNTER — Telehealth: Payer: Self-pay

## 2022-09-28 NOTE — Telephone Encounter (Signed)
Pt called stating that she is unable to make appt for 09/29/22. She has leg wraps and is going to the wound care center next Wednesday. She is seeking advice on what to do.  Reviewed pt's chart, returned call for clarification, two identifiers used. Asked pt if she could just reschedule her appt for Thursday, but she refused stating that she was in too much pain. Strongly encouraged her to try to come in. Pt is having her niece, who is a CMA, cut off Unna boots. Pt will go to wound care center next week. Instructed pt to have her niece wrap her legs in ACE wrap to protect them and keep her from scratching them. Appt canceled per request. Confirmed understanding.

## 2022-09-29 ENCOUNTER — Encounter (HOSPITAL_COMMUNITY): Payer: Self-pay | Admitting: Emergency Medicine

## 2022-09-29 ENCOUNTER — Emergency Department (HOSPITAL_COMMUNITY): Payer: HMO

## 2022-09-29 ENCOUNTER — Other Ambulatory Visit: Payer: Self-pay

## 2022-09-29 ENCOUNTER — Emergency Department (HOSPITAL_COMMUNITY)
Admission: EM | Admit: 2022-09-29 | Discharge: 2022-09-29 | Disposition: A | Discharge status: Home / Self Care | Payer: HMO | Source: Home / Self Care | Attending: Emergency Medicine | Admitting: Emergency Medicine

## 2022-09-29 DIAGNOSIS — N183 Chronic kidney disease, stage 3 unspecified: Secondary | ICD-10-CM | POA: Diagnosis present

## 2022-09-29 DIAGNOSIS — E1151 Type 2 diabetes mellitus with diabetic peripheral angiopathy without gangrene: Secondary | ICD-10-CM | POA: Diagnosis present

## 2022-09-29 DIAGNOSIS — Z7901 Long term (current) use of anticoagulants: Secondary | ICD-10-CM | POA: Insufficient documentation

## 2022-09-29 DIAGNOSIS — L03115 Cellulitis of right lower limb: Secondary | ICD-10-CM | POA: Diagnosis present

## 2022-09-29 DIAGNOSIS — M25512 Pain in left shoulder: Secondary | ICD-10-CM | POA: Diagnosis not present

## 2022-09-29 DIAGNOSIS — R52 Pain, unspecified: Secondary | ICD-10-CM | POA: Diagnosis not present

## 2022-09-29 DIAGNOSIS — Z20822 Contact with and (suspected) exposure to covid-19: Secondary | ICD-10-CM | POA: Insufficient documentation

## 2022-09-29 DIAGNOSIS — Z1152 Encounter for screening for COVID-19: Secondary | ICD-10-CM | POA: Diagnosis not present

## 2022-09-29 DIAGNOSIS — R921 Mammographic calcification found on diagnostic imaging of breast: Secondary | ICD-10-CM | POA: Diagnosis not present

## 2022-09-29 DIAGNOSIS — E1169 Type 2 diabetes mellitus with other specified complication: Secondary | ICD-10-CM | POA: Diagnosis present

## 2022-09-29 DIAGNOSIS — S2242XA Multiple fractures of ribs, left side, initial encounter for closed fracture: Secondary | ICD-10-CM | POA: Diagnosis not present

## 2022-09-29 DIAGNOSIS — M1711 Unilateral primary osteoarthritis, right knee: Secondary | ICD-10-CM | POA: Diagnosis present

## 2022-09-29 DIAGNOSIS — S2232XA Fracture of one rib, left side, initial encounter for closed fracture: Secondary | ICD-10-CM | POA: Diagnosis present

## 2022-09-29 DIAGNOSIS — I10 Essential (primary) hypertension: Secondary | ICD-10-CM | POA: Diagnosis not present

## 2022-09-29 DIAGNOSIS — Z6841 Body Mass Index (BMI) 40.0 and over, adult: Secondary | ICD-10-CM | POA: Diagnosis not present

## 2022-09-29 DIAGNOSIS — I5043 Acute on chronic combined systolic (congestive) and diastolic (congestive) heart failure: Secondary | ICD-10-CM | POA: Diagnosis not present

## 2022-09-29 DIAGNOSIS — Z794 Long term (current) use of insulin: Secondary | ICD-10-CM | POA: Diagnosis not present

## 2022-09-29 DIAGNOSIS — I2510 Atherosclerotic heart disease of native coronary artery without angina pectoris: Secondary | ICD-10-CM | POA: Insufficient documentation

## 2022-09-29 DIAGNOSIS — I509 Heart failure, unspecified: Secondary | ICD-10-CM | POA: Insufficient documentation

## 2022-09-29 DIAGNOSIS — I872 Venous insufficiency (chronic) (peripheral): Secondary | ICD-10-CM | POA: Diagnosis not present

## 2022-09-29 DIAGNOSIS — Z87891 Personal history of nicotine dependence: Secondary | ICD-10-CM | POA: Diagnosis not present

## 2022-09-29 DIAGNOSIS — Z79899 Other long term (current) drug therapy: Secondary | ICD-10-CM | POA: Insufficient documentation

## 2022-09-29 DIAGNOSIS — M542 Cervicalgia: Secondary | ICD-10-CM | POA: Diagnosis not present

## 2022-09-29 DIAGNOSIS — I5023 Acute on chronic systolic (congestive) heart failure: Secondary | ICD-10-CM | POA: Diagnosis not present

## 2022-09-29 DIAGNOSIS — E871 Hypo-osmolality and hyponatremia: Secondary | ICD-10-CM | POA: Diagnosis present

## 2022-09-29 DIAGNOSIS — R0682 Tachypnea, not elsewhere classified: Secondary | ICD-10-CM | POA: Diagnosis present

## 2022-09-29 DIAGNOSIS — R609 Edema, unspecified: Secondary | ICD-10-CM | POA: Diagnosis not present

## 2022-09-29 DIAGNOSIS — M160 Bilateral primary osteoarthritis of hip: Secondary | ICD-10-CM | POA: Diagnosis present

## 2022-09-29 DIAGNOSIS — M25551 Pain in right hip: Secondary | ICD-10-CM | POA: Diagnosis not present

## 2022-09-29 DIAGNOSIS — I50813 Acute on chronic right heart failure: Secondary | ICD-10-CM | POA: Diagnosis not present

## 2022-09-29 DIAGNOSIS — Z043 Encounter for examination and observation following other accident: Secondary | ICD-10-CM | POA: Diagnosis not present

## 2022-09-29 DIAGNOSIS — R739 Hyperglycemia, unspecified: Secondary | ICD-10-CM | POA: Diagnosis not present

## 2022-09-29 DIAGNOSIS — I517 Cardiomegaly: Secondary | ICD-10-CM | POA: Diagnosis not present

## 2022-09-29 DIAGNOSIS — Z7401 Bed confinement status: Secondary | ICD-10-CM | POA: Diagnosis not present

## 2022-09-29 DIAGNOSIS — I959 Hypotension, unspecified: Secondary | ICD-10-CM | POA: Diagnosis not present

## 2022-09-29 DIAGNOSIS — R600 Localized edema: Secondary | ICD-10-CM | POA: Insufficient documentation

## 2022-09-29 DIAGNOSIS — R627 Adult failure to thrive: Secondary | ICD-10-CM | POA: Diagnosis present

## 2022-09-29 DIAGNOSIS — N1831 Chronic kidney disease, stage 3a: Secondary | ICD-10-CM | POA: Diagnosis not present

## 2022-09-29 DIAGNOSIS — M19011 Primary osteoarthritis, right shoulder: Secondary | ICD-10-CM | POA: Diagnosis not present

## 2022-09-29 DIAGNOSIS — I83012 Varicose veins of right lower extremity with ulcer of calf: Secondary | ICD-10-CM | POA: Diagnosis present

## 2022-09-29 DIAGNOSIS — Z7409 Other reduced mobility: Secondary | ICD-10-CM | POA: Insufficient documentation

## 2022-09-29 DIAGNOSIS — M6281 Muscle weakness (generalized): Secondary | ICD-10-CM | POA: Diagnosis not present

## 2022-09-29 DIAGNOSIS — W19XXXA Unspecified fall, initial encounter: Secondary | ICD-10-CM | POA: Diagnosis not present

## 2022-09-29 DIAGNOSIS — J449 Chronic obstructive pulmonary disease, unspecified: Secondary | ICD-10-CM | POA: Diagnosis not present

## 2022-09-29 DIAGNOSIS — S76311A Strain of muscle, fascia and tendon of the posterior muscle group at thigh level, right thigh, initial encounter: Secondary | ICD-10-CM | POA: Diagnosis not present

## 2022-09-29 DIAGNOSIS — S2232XS Fracture of one rib, left side, sequela: Secondary | ICD-10-CM | POA: Diagnosis not present

## 2022-09-29 DIAGNOSIS — E1165 Type 2 diabetes mellitus with hyperglycemia: Secondary | ICD-10-CM | POA: Diagnosis present

## 2022-09-29 DIAGNOSIS — L03116 Cellulitis of left lower limb: Secondary | ICD-10-CM | POA: Diagnosis not present

## 2022-09-29 DIAGNOSIS — E7800 Pure hypercholesterolemia, unspecified: Secondary | ICD-10-CM | POA: Diagnosis present

## 2022-09-29 DIAGNOSIS — I110 Hypertensive heart disease with heart failure: Secondary | ICD-10-CM | POA: Diagnosis not present

## 2022-09-29 DIAGNOSIS — I5032 Chronic diastolic (congestive) heart failure: Secondary | ICD-10-CM | POA: Diagnosis present

## 2022-09-29 DIAGNOSIS — R2689 Other abnormalities of gait and mobility: Secondary | ICD-10-CM | POA: Diagnosis not present

## 2022-09-29 DIAGNOSIS — I739 Peripheral vascular disease, unspecified: Secondary | ICD-10-CM | POA: Diagnosis not present

## 2022-09-29 DIAGNOSIS — S81802A Unspecified open wound, left lower leg, initial encounter: Secondary | ICD-10-CM | POA: Diagnosis not present

## 2022-09-29 DIAGNOSIS — G8929 Other chronic pain: Secondary | ICD-10-CM | POA: Diagnosis not present

## 2022-09-29 DIAGNOSIS — S76811A Strain of other specified muscles, fascia and tendons at thigh level, right thigh, initial encounter: Secondary | ICD-10-CM | POA: Diagnosis present

## 2022-09-29 DIAGNOSIS — I130 Hypertensive heart and chronic kidney disease with heart failure and stage 1 through stage 4 chronic kidney disease, or unspecified chronic kidney disease: Secondary | ICD-10-CM | POA: Insufficient documentation

## 2022-09-29 DIAGNOSIS — E785 Hyperlipidemia, unspecified: Secondary | ICD-10-CM | POA: Diagnosis not present

## 2022-09-29 DIAGNOSIS — E1122 Type 2 diabetes mellitus with diabetic chronic kidney disease: Secondary | ICD-10-CM | POA: Diagnosis present

## 2022-09-29 DIAGNOSIS — R531 Weakness: Secondary | ICD-10-CM | POA: Diagnosis not present

## 2022-09-29 DIAGNOSIS — M25561 Pain in right knee: Secondary | ICD-10-CM | POA: Diagnosis not present

## 2022-09-29 DIAGNOSIS — I480 Paroxysmal atrial fibrillation: Secondary | ICD-10-CM | POA: Diagnosis present

## 2022-09-29 DIAGNOSIS — M25461 Effusion, right knee: Secondary | ICD-10-CM | POA: Diagnosis not present

## 2022-09-29 DIAGNOSIS — I83022 Varicose veins of left lower extremity with ulcer of calf: Secondary | ICD-10-CM | POA: Diagnosis present

## 2022-09-29 DIAGNOSIS — X58XXXA Exposure to other specified factors, initial encounter: Secondary | ICD-10-CM | POA: Diagnosis present

## 2022-09-29 DIAGNOSIS — R5381 Other malaise: Secondary | ICD-10-CM | POA: Diagnosis present

## 2022-09-29 DIAGNOSIS — R102 Pelvic and perineal pain: Secondary | ICD-10-CM | POA: Diagnosis not present

## 2022-09-29 LAB — BASIC METABOLIC PANEL
Anion gap: 9 (ref 5–15)
BUN: 29 mg/dL — ABNORMAL HIGH (ref 8–23)
CO2: 27 mmol/L (ref 22–32)
Calcium: 9.5 mg/dL (ref 8.9–10.3)
Chloride: 96 mmol/L — ABNORMAL LOW (ref 98–111)
Creatinine, Ser: 1.37 mg/dL — ABNORMAL HIGH (ref 0.44–1.00)
GFR, Estimated: 41 mL/min — ABNORMAL LOW (ref >60)
Glucose, Bld: 261 mg/dL — ABNORMAL HIGH (ref 70–99)
Potassium: 4.5 mmol/L (ref 3.5–5.1)
Sodium: 132 mmol/L — ABNORMAL LOW (ref 135–145)

## 2022-09-29 LAB — URINALYSIS, ROUTINE W REFLEX MICROSCOPIC
Bacteria, UA: NONE SEEN
Bilirubin Urine: NEGATIVE
Glucose, UA: >=500 mg/dL — AB
Ketones, ur: NEGATIVE mg/dL
Leukocytes,Ua: NEGATIVE
Nitrite: NEGATIVE
Protein, ur: NEGATIVE mg/dL
Specific Gravity, Urine: 1.009 (ref 1.005–1.030)
pH: 6 (ref 5.0–8.0)

## 2022-09-29 LAB — CBC WITH DIFFERENTIAL/PLATELET
Abs Immature Granulocytes: 0.05 10*3/uL (ref 0.00–0.07)
Basophils Absolute: 0.1 10*3/uL (ref 0.0–0.1)
Basophils Relative: 1 %
Eosinophils Absolute: 0.1 10*3/uL (ref 0.0–0.5)
Eosinophils Relative: 1 %
HCT: 35.1 % — ABNORMAL LOW (ref 36.0–46.0)
Hemoglobin: 11.3 g/dL — ABNORMAL LOW (ref 12.0–15.0)
Immature Granulocytes: 0 %
Lymphocytes Relative: 9 %
Lymphs Abs: 1.1 10*3/uL (ref 0.7–4.0)
MCH: 28.6 pg (ref 26.0–34.0)
MCHC: 32.2 g/dL (ref 30.0–36.0)
MCV: 88.9 fL (ref 80.0–100.0)
Monocytes Absolute: 0.8 10*3/uL (ref 0.1–1.0)
Monocytes Relative: 7 %
Neutro Abs: 9.2 10*3/uL — ABNORMAL HIGH (ref 1.7–7.7)
Neutrophils Relative %: 82 %
Platelets: 250 10*3/uL (ref 150–400)
RBC: 3.95 MIL/uL (ref 3.87–5.11)
RDW: 13.2 % (ref 11.5–15.5)
WBC: 11.3 10*3/uL — ABNORMAL HIGH (ref 4.0–10.5)
nRBC: 0 % (ref 0.0–0.2)

## 2022-09-29 LAB — RESP PANEL BY RT-PCR (RSV, FLU A&B, COVID)  RVPGX2
Influenza A by PCR: NEGATIVE
Influenza B by PCR: NEGATIVE
Resp Syncytial Virus by PCR: NEGATIVE
SARS Coronavirus 2 by RT PCR: NEGATIVE

## 2022-09-29 LAB — BRAIN NATRIURETIC PEPTIDE: B Natriuretic Peptide: 34.5 pg/mL (ref 0.0–100.0)

## 2022-09-29 MED ORDER — LIDOCAINE 5 % EX PTCH
2.0000 | MEDICATED_PATCH | CUTANEOUS | Status: DC
  Administered 2022-09-29: 2 via TRANSDERMAL
  Filled 2022-09-29 (×2): qty 2

## 2022-09-29 MED ORDER — SODIUM CHLORIDE 0.9 % IV BOLUS
500.0000 mL | Freq: Once | INTRAVENOUS | Status: AC
  Administered 2022-09-29: 500 mL via INTRAVENOUS

## 2022-09-29 NOTE — ED Provider Notes (Signed)
[name] DEPT Provider Note   CSN: [id number] Arrival date & time: 09/29/22  1626     History  Chief Complaint  Patient presents with   Weakness    [name] is a 72 y.o. female with a past medical history of obesity, paroxysmal A-fib, hypertension, CAD, CHF and CKD stage III presenting today due to leg pain.  She tells me that over the past week she has been having more severe pain and swelling to the lower extremities that has resulted in decreased mobility.  She follows with wound care for lymphedema from chronic venous insufficiency and had both of her legs wrapped until yesterday.  Denies any fevers, chills or drainage from either leg at this time.  Denies any generalized weakness, fevers, chills, confusion, abdominal pain, nausea, vomiting, dysuria but does endorse some urinary frequency.  Weakness Associated symptoms: arthralgias   Associated symptoms: no dizziness, no fever and no myalgias        Home Medications Prior to Admission medications   Medication Sig Start Date End Date Taking? Authorizing Provider  Blood Glucose Monitoring Suppl ([place]) w/Device KIT  07/23/20   [provider]  Continuous Blood Gluc Receiver (FREESTYLE LIBRE 2 READER) [place] See admin instructions. 11/18/20   [provider]  furosemide (LASIX) 40 MG tablet Take 1 tablet (40 mg total) by mouth 3 (three) times a week. TAKE Monday Wednesday AND FRIDAY 08/11/22   Croitoru, Dani Gobble, MD  insulin NPH-regular Human (70-30) 100 UNIT/ML injection Inject 20-62 Units into the skin 2 (two) times daily with a meal. 62 units in the morning and 20 units at bedtme    [provider]  Lancets (ONETOUCH DELICA PLUS [id number]) [place] Apply topically. 08/13/20   [provider]  lisinopril-hydrochlorothiazide (PRINZIDE,ZESTORETIC) 20-25 MG per tablet Take 1 tablet by mouth daily.    [provider]  metoCLOPramide  (REGLAN) 10 MG tablet Take 10 mg by mouth 2 (two) times daily. Per patient    [provider]  metoprolol succinate (TOPROL-XL) 50 MG 24 hr tablet TAKE 1 TABLET BY MOUTH EVERY DAY 11/03/21   Croitoru, Mihai, MD  metoprolol tartrate (LOPRESSOR) 25 MG tablet Take 1 table as needed for heart rate above 120bpm. 07/17/21   Bhagat, Bhavinkumar, PA  ONETOUCH VERIO test strip 1 each 2 (two) times daily. 10/11/20   [provider]  pantoprazole (PROTONIX) 40 MG tablet Take 1 tablet (40 mg total) by mouth daily. 06/24/22   Croitoru, Mihai, MD  simvastatin (ZOCOR) 40 MG tablet TAKE 1 TABLET BY MOUTH DAILY AT 6 PM. 06/24/22   Croitoru, Mihai, MD  spironolactone (ALDACTONE) 25 MG tablet Take 1 tablet (25 mg total) by mouth 3 (three) times a week. TAKE Monday Wednesday AND Friday 08/11/22   Croitoru, Dani Gobble, MD  traMADol (ULTRAM) 50 MG tablet Take 1 tablet (50 mg total) by mouth every 12 (twelve) hours as needed. 09/01/22   Meredith Pel, MD  Vitamin D, Cholecalciferol, 1000 units TABS Take 1,000 Units by mouth every morning.     [provider]  warfarin (COUMADIN) 7.5 MG tablet TAKE 1/2 TO 1 TABLET DAILY AS DIRECTED BY COUMADIN CLINIC 04/08/22   Croitoru, Mihai, MD      Allergies    Levofloxacin and Morphine and related    Review of Systems   Review of Systems  Constitutional:  Negative for chills, fatigue and fever.  Musculoskeletal:  Positive for arthralgias and gait problem. Negative for back  pain and myalgias.  Neurological:  Negative for dizziness, weakness and light-headedness.  Psychiatric/Behavioral:  Negative for confusion.     Physical Exam Updated Vital Signs Pulse (!) 101   Temp 97.7 F (36.5 C) (Oral)   Resp 18   Ht _0  (1.6 m)   Wt 132 kg   SpO2 100%   BMI 51.55 kg/m  Physical Exam Vitals and nursing note reviewed.  Constitutional:      Appearance: Normal appearance.  HENT:     Head: Normocephalic and atraumatic.  Eyes:     General: No scleral  icterus.    Conjunctiva/sclera: Conjunctivae normal.  Pulmonary:     Effort: Pulmonary effort is normal. No respiratory distress.  Musculoskeletal:     Right lower leg: Edema present.     Left lower leg: Edema present.     Comments: Bilateral lower extremity edema.  Nonpitting.  Appears to have venous stasis, no weeping areas of skin breakdown.  Skin:    General: Skin is warm and dry.     Findings: No rash.  Neurological:     Mental Status: She is alert.  Psychiatric:        Mood and Affect: Mood normal.    ED Results / Procedures / Treatments   Labs (all labs ordered are listed, but only abnormal results are displayed) Labs Reviewed  CBC WITH DIFFERENTIAL/PLATELET - Abnormal; Notable for the following components:      Result Value   WBC 11.3 (*)    Hemoglobin 11.3 (*)    HCT 35.1 (*)    Neutro Abs 9.2 (*)    All other components within normal limits  BASIC METABOLIC PANEL - Abnormal; Notable for the following components:   Sodium 132 (*)    Chloride 96 (*)    Glucose, Bld 261 (*)    BUN 29 (*)    Creatinine, Ser 1.37 (*)    GFR, Estimated 41 (*)    All other components within normal limits  RESP PANEL BY RT-PCR (RSV, FLU A&B, COVID)  [id number]  BRAIN NATRIURETIC PEPTIDE  URINALYSIS, ROUTINE W REFLEX MICROSCOPIC    EKG EKG Interpretation  Date/Time:  Wednesday September 29 2022 20:16:01 EST Ventricular Rate:  97 PR Interval:  133 QRS Duration: 175 QT Interval:  409 QTC Calculation: 520 R Axis:   -19 Text Interpretation: Sinus rhythm IVCD, consider atypical RBBB LVH with IVCD and secondary repol abnrm Prolonged QT interval , increased since last tracing Confirmed by Dorie Rank [phone]) on 09/29/2022 8:23:25 PM  Radiology DG Pelvis Portable  Result Date: 09/29/2022 CLINICAL DATA:  Pain weakness EXAM: PORTABLE PELVIS 1-2 VIEWS COMPARISON:  11/26/2011, 09/01/2022 FINDINGS: SI joints are non widened. Pubic symphysis is intact. No fracture or malalignment. Vascular  calcifications. IMPRESSION: No acute osseous abnormality Electronically Signed   By: Donavan Foil M.D.   On: 09/29/2022 21:15    Procedures Procedures   Medications Ordered in ED Medications  sodium chloride 0.9 % bolus 500 mL (500 mLs Intravenous New Bag/Given 09/29/22 2034)    ED Course/ Medical Decision Making/ A&P                           Medical Decision Making Amount and/or Complexity of Data Reviewed Labs: ordered. Radiology: ordered.   72 year old female presenting with bilateral leg pain and swelling. Ddx includes venous stasis, dvt, PAD, CHF.  This is not an exhaustive differential.    Past Medical History / Co-morbidities /  Social History: Morbid obesity, CKD, venous insufficiency     Physical Exam: Pertinent physical exam findings include Venous stasis, no drainage Morbid obesity  Lab Tests: I ordered, and personally interpreted labs.  The pertinent results include: Baseline kidney function Glucosuria and elevated blood glucose.  Indicating poorly controlled diabetes.  Per chart review, last A1c was over 8 Mild hyponatremia and hypochloremia   Imaging Studies: Pelvic x-ray ordered, viewed and interpreted by me.  Agree with radiology that there are no findings  Cardiac Monitoring:  The patient was maintained on a cardiac monitor.  I viewed and interpreted the cardiac monitored which showed an underlying rhythm of: sinus, icd noted   Medications: Fluid bolus, patient does not appear fluid overloaded    MDM/Disposition: This is a 72 year old female presenting today with leg pain and swelling.  This is a chronic problem but acutely worse over the past week.  Her son is concerned he is unable to care for at home.  Patient is morbidly obese with very severe venous insufficiency.  Do not believe her lower extremity edema is related to heart failure, patient does not appear fluid overloaded and do not believe she needs diuresis.  Unfortunately, all of patient's  problems are chronic.  There is no indication to admit the patient to the hospital today as developed by her son.  I have placed a face-to-face order for home health and a consult to social work.  Patient's son is thankful for this.  Patient is hesitant on SNF but would like somebody to come out to the house to help her outpatient.  At this time patient is stable for discharge home with follow-up outpatient for her venous insufficiency, lymphedema and chronic pain.    I discussed this case with my attending physician Dr. Tomi Bamberger who cosigned this note including patient's presenting symptoms, physical exam, and planned diagnostics and interventions. Attending physician stated agreement with plan or made changes to plan which were implemented.     Final Clinical Impression(s) / ED Diagnoses Final diagnoses:  Declining mobility  Venous insufficiency of both lower extremities    Rx / DC Orders ED Discharge Orders     None      Results and diagnoses were explained to the patient. Return precautions discussed in full. Patient had no additional questions and expressed complete understanding.   This chart was dictated using voice recognition software.  Despite best efforts to proofread,  errors can occur which can change the documentation meaning.    Darliss Ridgel 09/30/22 1152    Dorie Rank, MD 10/01/22 0700

## 2022-09-29 NOTE — ED Triage Notes (Signed)
Pt bib ems from home c/o generalized weakness, bilateral lower leg swelling. Pt a&ox4

## 2022-09-29 NOTE — Discharge Instructions (Addendum)
You came to the emergency department with swelling and pain in your legs.  This is likely secondary to your chronic venous insufficiency.  Please read the information about this attached to these discharge papers.  It is very important for you to keep your legs elevated and wear your compression stockings if they are not wrapped from wound care.  Additionally, please look out for a phone call from social work either tomorrow or the following day.  They should be able to assist in getting you better outpatient care.  Do not hesitate to return with any worsening symptoms.  It was a pleasure to meet you and we hope you feel better!

## 2022-09-29 NOTE — Progress Notes (Signed)
Transition of Care [facility]) - Emergency Department Mini Assessment   Patient Details  Name: [name] MRN: [id number] Date of Birth: 1951/07/05  [facility]) CM/SW Contact:    Rodney Booze, LCSW Phone Number: 09/29/2022, 10:05 PM   Clinical Narrative:  CSW spoke to the patient at bedside, CSW will leave a note for AM TOC to set up [facility]. The patient and the son both agreed to do home health as the patient does want to be at home. The son stated that he just needs a little support with the mom be able to move around more on her own. CSW explained that home health would need to be set in the AM as its to late to do so now. TOC will reach out to family.   ED Mini Assessment: What brought you to the Emergency Department? : (P) Patient can not move generlized weakness . The patient's son would really like home health for the mom. CSW spoke to son and mom at bedside.  Barriers to Discharge: (P) No Barriers Identified  Barrier interventions: (P) N/A  Means of departure: (P) Car  Interventions which prevented an admission or readmission: (P) [place] or Services    Patient Contact and Communications        ,                 Admission diagnosis:  general weakness , right leg immobile , swelling Patient Active Problem List   Diagnosis Date Noted   Atrial flutter ([place]) 07/14/2021   Chest pain with moderate risk for cardiac etiology 07/14/2021   COPD (chronic obstructive pulmonary disease) ([place]) 12/05/2020   Hypercholesterolemia 01/11/2018   Diabetes mellitus type 2 in obese ([place]) 01/11/2018   Pulmonary nodules/lesions, multiple 11/23/2017   RBBB 10/31/2017   S/P CABG x 1 08/17/2017   Abnormal nuclear stress test 02/05/2016   Chest pain with high risk for cardiac etiology 02/05/2016   CAD S/P percutaneous coronary angioplasty 02/05/2016   GERD (gastroesophageal reflux disease) 01/06/2016   CKD (chronic kidney disease) stage 3, GFR 30-59 ml/min  (HCC) 01/06/2016   Chronic diastolic heart failure ([place]) 06/28/2015   Esophageal reflux 02/15/2014   Anemia 01/17/2014   S/P left TK revision 12/31/2013   Knee osteomyelitis, left ([place]) 11/21/2013   Foreign body of knee with infection 11/18/2013   Unspecified constipation 11/07/2013   Expected blood loss anemia 11/06/2013   Left knee spacer, following removal of joint prosthesis 11/05/2013   DM (diabetes mellitus), type 2 with peripheral vascular complications ([place]) 50/53/9767   Onychomycosis 10/23/2013   Pain, foot 10/23/2013   DJD (degenerative joint disease) of knee 04/10/2013   Vaginal atrophy 01/01/2013   OAB (overactive bladder) 01/01/2013   Osteopenia 01/01/2013   Chest pain 11/28/2012   Palpitations 11/22/2012   Dyslipidemia 11/22/2012   Chronic anticoagulation 11/22/2012   Super obese 07/11/2012   Trimalleolar fracture 07/07/2012   Coronary artery disease involving native coronary artery of native heart without angina pectoris 04/08/2012   ICM, EF 40-45% cath 04/07/12- improved to 50-60% by echo Oct 2013 04/08/2012   Paroxysmal atrial fibrillation ([place]) 04/07/2012   NSTEMI (non-ST elevated myocardial infarction)- 7/13 04/07/2012   Unstable angina ([place]) 04/07/2012   PVD (peripheral vascular disease), Lt SFA PTA Aug 2012 04/07/2012   Cancer of left lung parenchyma ([place]) 01/13/2009   Insulin dependent diabetes mellitus ([place]) 11/27/2008   Essential hypertension 11/27/2008   PCP:  [name], MD Pharmacy:   [facility] ([place], [place] [address] Phone: [phone] Fax: [phone]  [id number] - [place], [place] [address] Phone: [phone] Fax: [phone]

## 2022-09-30 ENCOUNTER — Telehealth: Payer: Self-pay

## 2022-09-30 NOTE — Telephone Encounter (Signed)
This RNCM spoke with patient to offer choice and to advise HHPT/OT, RN, SW, HHA, OT has been set up with [facility]. Patient reports her CM with [employer] [facility] request to speak with this RNCM or [facility]. This RNCM gave the patient my contact information and [facility] contact info.  No additional TOC needs at this time.

## 2022-09-30 NOTE — Telephone Encounter (Signed)
Called patient's son Ilona Sorrel, no answer

## 2022-09-30 NOTE — Progress Notes (Signed)
JENYA, PUTZ [name] ([id number]) [id number] Page 1 of 7 Visit Report for 09/01/2022 Arrival Information Details Patient Name: Date of Service: [name], [name]. 09/01/2022 9:15 [name] Medical Record Number: [id number] Patient Account Number: [id number] Date of Birth/Sex: Treating RN: 10-03-1950 (72 y.o. [name], [name] Primary Care Tremeka Helbling: [name] Other Clinician: Referring Drianna Chandran: Treating Shae Hinnenkamp/Extender: Angelica Pou in Treatment: 12 Visit Information History Since Last Visit Added or deleted any medications: No Patient Arrived: Gilford Rile Any new allergies or adverse reactions: No Arrival Time: 09:45 Had [name] fall or experienced change in No Accompanied By: self activities of daily living that may affect Transfer Assistance: None risk of falls: Patient Identification Verified: Yes Signs or symptoms of abuse/neglect since last visito No Secondary Verification Process Completed: Yes Hospitalized since last visit: No Patient Requires Transmission-Based No Implantable device outside of the clinic excluding No Precautions: cellular tissue based products placed in the center Patient Has Alerts: Yes since last visit: Patient Alerts: VVS ABI [place] 10/23/21 Has Dressing in Place as Prescribed: Yes TBI L 0.29 R 0.28 Has Compression in Place as Prescribed: No Pain Present Now: No Notes patient has not worn her compression stocking to left leg since last week. Blisters and edema noted to left lower leg. Refael Fulop made aware. Electronic Signature(s) Signed: 09/01/2022 5:04:42 PM By: Deon Pilling RN, BSN Entered By: Deon Pilling on 09/01/2022 09:58:41 -------------------------------------------------------------------------------- Compression Therapy Details Patient Name: Date of Service: [name]. 09/01/2022 9:15 [name] Medical Record Number: [id number] Patient Account Number: [id number] Date of Birth/Sex:  Treating RN: 08-Jan-1951 (72 y.o. [name], [name] Primary Care Shem Plemmons: [name] Other Clinician: Referring Kollen Armenti: Treating Cashmere Harmes/Extender: Angelica Pou in Treatment: 12 Compression Therapy Performed for Wound Assessment: Wound #3R Right,Posterior Lower Leg Performed By: Clinician Rhae Hammock, RN Compression Type: Rolena Infante Post Procedure Diagnosis Same as Pre-procedure Electronic Signature(s) Signed: 09/29/2022 5:35:40 PM By: Rhae Hammock RN Entered By: Rhae Hammock on 09/01/2022 10:19:31 [name] ([id number]) [id number] Page 2 of 7 -------------------------------------------------------------------------------- Encounter Discharge Information Details Patient Name: Date of Service: [name], [name]. 09/01/2022 9:15 [name] Medical Record Number: [id number] Patient Account Number: [id number] Date of Birth/Sex: Treating RN: [birth year] (72 y.o. [name], [name] Primary Care Liliani Bobo: [name] Other Clinician: Referring Briann Sarchet: Treating Brylie Sneath/Extender: Angelica Pou in Treatment: 12 Encounter Discharge Information Items Discharge Condition: Stable Ambulatory Status: Ambulatory Discharge Destination: Home Transportation: Private Auto Accompanied By: self Schedule Follow-up Appointment: Yes Clinical Summary of Care: Patient Declined Electronic Signature(s) Signed: 09/29/2022 5:35:40 PM By: Rhae Hammock RN Entered By: Rhae Hammock on 09/01/2022 10:22:16 -------------------------------------------------------------------------------- Lower Extremity Assessment Details Patient Name: Date of Service: [name]. 09/01/2022 9:15 [name] Medical Record Number: [id number] Patient Account Number: [id number] Date of Birth/Sex: Treating RN: [birth year] (72 y.o. [name] Primary Care Estie Sproule: [name] Other  Clinician: Referring Tammi Boulier: Treating Kyheem Bathgate/Extender: [name], [name] in Treatment: 12 Edema Assessment Assessed: [Left: Yes] Patrice Paradise: Yes] Edema: [Left: Yes] [Right: Yes] Calf Left: Right: Point of Measurement: 34.4 cm From Medial Instep 51 cm 56 cm Ankle Left: Right: Point of Measurement: 10 cm From Medial Instep 29 cm 26 cm Vascular Assessment Pulses: Dorsalis Pedis Palpable: [Left:Yes] [Right:Yes] Electronic Signature(s) Signed: 09/01/2022 5:04:42 PM By: Deon Pilling RN, BSN Entered By: Deon Pilling on 09/01/2022 09:59:27 [name] ([id number]) [id number] Page 3 of 7 -------------------------------------------------------------------------------- Multi-Disciplinary Care Plan Details Patient Name:  Date of Service: [name], [name]. 09/01/2022 9:15 [name] Medical Record Number: [id number] Patient Account Number: [id number] Date of Birth/Sex: Treating RN: 07-20-1951 (72 y.o. [name], [name] Primary Care Tekela Garguilo: [name] Other Clinician: Referring Raimundo Corbit: Treating Sorina Derrig/Extender: Angelica Pou in Treatment: 12 Active Inactive Venous Leg Ulcer Nursing Diagnoses: Actual venous Insuffiency (use after diagnosis is confirmed) Goals: Patient will maintain optimal edema control Date Initiated: 08/11/2022 Target Resolution Date: 10/01/2022 Goal Status: Active Interventions: Assess peripheral edema status every visit. Compression as ordered Provide education on venous insufficiency Treatment Activities: Therapeutic compression applied : 08/11/2022 Notes: Electronic Signature(s) Signed: 09/29/2022 5:35:40 PM By: Rhae Hammock RN Entered By: Rhae Hammock on 09/01/2022 10:21:09 -------------------------------------------------------------------------------- Non-Wound Condition Assessment Details Patient Name: Date of Service: [name].  09/01/2022 9:15 [name] Medical Record Number: [id number] Patient Account Number: [id number] Date of Birth/Sex: Treating RN: 01-02-1951 (72 y.o. [name] Primary Care Pierrette Scheu: [name] Other Clinician: Referring Bently Morath: Treating Sparsh Callens/Extender: [name], [name] in Treatment: 12 Non-Wound Condition: Condition: Lymphedema Location: Leg Side: Left Photos [name], [name] ([id number]) [phone].pdf Page 4 of 7 Periwound Skin Texture Texture Color No Abnormalities Noted: No No Abnormalities Noted: No Callus: No Atrophie Blanche: No Crepitus: No Cyanosis: No Excoriation: No Ecchymosis: No Friable: No Erythema: No Induration: No Hemosiderin Staining: Yes Rash: No Mottled: No Scarring: No Pallor: No Rubor: No Moisture No Abnormalities Noted: No Dry / Scaly: No Maceration: No Notes cobblestone appearance. blisters and weeping noted. Electronic Signature(s) Signed: 09/01/2022 5:04:42 PM By: Deon Pilling RN, BSN Entered By: Deon Pilling on 09/01/2022 10:02:50 -------------------------------------------------------------------------------- Pain Assessment Details Patient Name: Date of Service: [name]. 09/01/2022 9:15 [name] Medical Record Number: [id number] Patient Account Number: [id number] Date of Birth/Sex: Treating RN: Jan 26, 1951 (72 y.o. [name] Primary Care Gail Creekmore: [name] Other Clinician: Referring Tahesha Skeet: Treating Cherisa Brucker/Extender: Angelica Pou in Treatment: 12 Active Problems Location of Pain Severity and Description of Pain Patient Has Paino No Site Locations [place], [place] [name] ([id number]) [phone].pdf Page 5 of 7 Pain Management and Medication Current Pain Management: Notes patient at times hip and leg pain. Electronic Signature(s) Signed: 09/01/2022 5:04:42 PM By: Deon Pilling RN, BSN Entered By:  Deon Pilling on 09/01/2022 09:59:14 -------------------------------------------------------------------------------- Patient/Caregiver Education Details Patient Name: Date of Service: [name]. 12/6/2023andnbsp9:15 [name] Medical Record Number: [id number] Patient Account Number: [id number] Date of Birth/Gender: Treating RN: 11-05-1950 (72 y.o. [name], [name] Primary Care Physician: [name] Other Clinician: Referring Physician: Treating Physician/Extender: Angelica Pou in Treatment: 12 Education Assessment Education Provided To: Patient Education Topics Provided Venous: Methods: Explain/Verbal Responses: Reinforcements needed, State content correctly Electronic Signature(s) Signed: 09/29/2022 5:35:40 PM By: Rhae Hammock RN Entered By: Rhae Hammock on 09/01/2022 10:21:24 -------------------------------------------------------------------------------- Wound Assessment Details Patient Name: Date of Service: [name]. 09/01/2022 9:15 [name] Medical Record Number: [id number] Patient Account Number: [id number] Date of Birth/Sex: Treating RN: 1950/12/11 (72 y.o. [name] Primary Care Lakeena Downie: [name] Other Clinician: Referring Henleigh Robello: Treating Bleu Moisan/Extender: [name], [name] in Treatment: 12 Wound Status Wound Number: 3R Primary Lymphedema Etiology: Wound Location: Right, Posterior Lower Leg Wound Open Wounding Event: Blister Status: Date Acquired: 05/26/2022 Comorbid Cataracts, Sleep Apnea, Arrhythmia, Coronary Artery Disease, Weeks Of Treatment: 12 History: Hypertension, Peripheral Venous Disease, Type II Diabetes, Clustered Wound: No Osteoarthritis, Neuropathy Photos [name], [name] [name] ([id number]) [id number] Page  6 of 7 Wound Measurements Length: (cm) Width: (cm) Depth: (cm) Area: (cm) Volume: (cm) 0 % Reduction in  Area: 100% 0 % Reduction in Volume: 100% 0 Epithelialization: Large (67-100%) 0 Tunneling: No 0 Undermining: No Wound Description Classification: Full Thickness Without Exposed Support S Wound Margin: Distinct, outline attached Exudate Amount: None Present tructures Foul Odor After Cleansing: No Slough/Fibrino No Wound Bed Granulation Amount: None Present (0%) Exposed Structure Necrotic Amount: None Present (0%) Fascia Exposed: No Fat Layer (Subcutaneous Tissue) Exposed: No Tendon Exposed: No Muscle Exposed: No Joint Exposed: No Bone Exposed: No Periwound Skin Texture Texture Color No Abnormalities Noted: No No Abnormalities Noted: No Callus: No Atrophie Blanche: No Crepitus: No Cyanosis: No Excoriation: No Ecchymosis: No Induration: No Erythema: No Rash: No Hemosiderin Staining: No Scarring: No Mottled: No Pallor: No Moisture Rubor: No No Abnormalities Noted: No Dry / Scaly: No Maceration: No Electronic Signature(s) Signed: 09/01/2022 5:04:42 PM By: Deon Pilling RN, BSN Entered By: Deon Pilling on 09/01/2022 10:02:21 -------------------------------------------------------------------------------- Vitals Details Patient Name: Date of Service: [name]. 09/01/2022 9:15 [name] Medical Record Number: [id number] Patient Account Number: [id number] Date of Birth/Sex: Treating RN: Jan 28, 1951 (72 y.o. [name] Primary Care Shalamar Crays: [name] Other Clinician: Referring Kanon Novosel: Treating Amillion Scobee/Extender: [name], [name] in Treatment: 12 Vital Signs Time Taken: 10:00 Temperature (F): 98.5 Height (in): 63 Pulse (bpm): [place], [place] [name] ([id number]) [id number] Page 7 of 7 Weight (lbs): 284 Respiratory Rate (breaths/min): 20 Body Mass Index (BMI): 50.3 Blood Pressure (mmHg): 140/84 Reference Range: 80 - 120 mg / dl Electronic Signature(s) Signed: 09/01/2022 5:04:42 PM By:  Deon Pilling RN, BSN Entered By: Deon Pilling on 09/01/2022 10:01:32

## 2022-09-30 NOTE — Telephone Encounter (Signed)
This RNCM contacted [name] with [facility] for HHPT/OT, RN, SW, HHA services. This RNCM received secure chat that patient needed services however was discharged from the [facility] ED 09/29/22 after hours. [name] will follow patient for [facility] needs.   No additional TOC needs at this time.

## 2022-09-30 NOTE — Telephone Encounter (Signed)
Received inbound call from [place] with [facility] confirming [facility] services being set up. This RNCM advised HHPT/OT, SW, RN, HHA were ordered [place] can service all disciplines except HHA at this time. [name] will call the patient to schedule start of care.   No additional TOC needs at this time.

## 2022-10-01 ENCOUNTER — Emergency Department (HOSPITAL_COMMUNITY): Payer: HMO

## 2022-10-01 ENCOUNTER — Other Ambulatory Visit: Payer: Self-pay

## 2022-10-01 ENCOUNTER — Telehealth: Payer: Self-pay

## 2022-10-01 ENCOUNTER — Encounter (HOSPITAL_COMMUNITY): Payer: Self-pay

## 2022-10-01 ENCOUNTER — Inpatient Hospital Stay (HOSPITAL_COMMUNITY)
Admission: EM | Admit: 2022-10-01 | Discharge: 2022-10-08 | DRG: 554 | Disposition: A | Discharge status: Skilled Nursing Facility | Payer: HMO | Attending: Internal Medicine | Admitting: Internal Medicine

## 2022-10-01 DIAGNOSIS — I5032 Chronic diastolic (congestive) heart failure: Secondary | ICD-10-CM | POA: Diagnosis present

## 2022-10-01 DIAGNOSIS — I872 Venous insufficiency (chronic) (peripheral): Secondary | ICD-10-CM | POA: Diagnosis present

## 2022-10-01 DIAGNOSIS — N183 Chronic kidney disease, stage 3 unspecified: Secondary | ICD-10-CM | POA: Diagnosis present

## 2022-10-01 DIAGNOSIS — Z9841 Cataract extraction status, right eye: Secondary | ICD-10-CM

## 2022-10-01 DIAGNOSIS — M160 Bilateral primary osteoarthritis of hip: Secondary | ICD-10-CM | POA: Diagnosis present

## 2022-10-01 DIAGNOSIS — I480 Paroxysmal atrial fibrillation: Secondary | ICD-10-CM | POA: Diagnosis present

## 2022-10-01 DIAGNOSIS — I739 Peripheral vascular disease, unspecified: Secondary | ICD-10-CM | POA: Diagnosis not present

## 2022-10-01 DIAGNOSIS — I83022 Varicose veins of left lower extremity with ulcer of calf: Secondary | ICD-10-CM | POA: Diagnosis present

## 2022-10-01 DIAGNOSIS — Z1152 Encounter for screening for COVID-19: Secondary | ICD-10-CM | POA: Diagnosis not present

## 2022-10-01 DIAGNOSIS — N1831 Chronic kidney disease, stage 3a: Secondary | ICD-10-CM | POA: Diagnosis not present

## 2022-10-01 DIAGNOSIS — I83012 Varicose veins of right lower extremity with ulcer of calf: Secondary | ICD-10-CM | POA: Diagnosis present

## 2022-10-01 DIAGNOSIS — E871 Hypo-osmolality and hyponatremia: Secondary | ICD-10-CM | POA: Diagnosis present

## 2022-10-01 DIAGNOSIS — Z96652 Presence of left artificial knee joint: Secondary | ICD-10-CM | POA: Diagnosis present

## 2022-10-01 DIAGNOSIS — L03115 Cellulitis of right lower limb: Secondary | ICD-10-CM | POA: Diagnosis present

## 2022-10-01 DIAGNOSIS — W19XXXA Unspecified fall, initial encounter: Secondary | ICD-10-CM | POA: Diagnosis not present

## 2022-10-01 DIAGNOSIS — Z794 Long term (current) use of insulin: Secondary | ICD-10-CM

## 2022-10-01 DIAGNOSIS — Z79899 Other long term (current) drug therapy: Secondary | ICD-10-CM

## 2022-10-01 DIAGNOSIS — L03116 Cellulitis of left lower limb: Secondary | ICD-10-CM

## 2022-10-01 DIAGNOSIS — Z87891 Personal history of nicotine dependence: Secondary | ICD-10-CM | POA: Diagnosis not present

## 2022-10-01 DIAGNOSIS — E878 Other disorders of electrolyte and fluid balance, not elsewhere classified: Secondary | ICD-10-CM | POA: Diagnosis present

## 2022-10-01 DIAGNOSIS — I10 Essential (primary) hypertension: Secondary | ICD-10-CM | POA: Diagnosis present

## 2022-10-01 DIAGNOSIS — Z951 Presence of aortocoronary bypass graft: Secondary | ICD-10-CM

## 2022-10-01 DIAGNOSIS — E1151 Type 2 diabetes mellitus with diabetic peripheral angiopathy without gangrene: Secondary | ICD-10-CM | POA: Diagnosis present

## 2022-10-01 DIAGNOSIS — X58XXXA Exposure to other specified factors, initial encounter: Secondary | ICD-10-CM | POA: Diagnosis present

## 2022-10-01 DIAGNOSIS — I130 Hypertensive heart and chronic kidney disease with heart failure and stage 1 through stage 4 chronic kidney disease, or unspecified chronic kidney disease: Secondary | ICD-10-CM | POA: Diagnosis present

## 2022-10-01 DIAGNOSIS — Z8249 Family history of ischemic heart disease and other diseases of the circulatory system: Secondary | ICD-10-CM

## 2022-10-01 DIAGNOSIS — Z6841 Body Mass Index (BMI) 40.0 and over, adult: Secondary | ICD-10-CM

## 2022-10-01 DIAGNOSIS — E1165 Type 2 diabetes mellitus with hyperglycemia: Secondary | ICD-10-CM | POA: Diagnosis present

## 2022-10-01 DIAGNOSIS — M1711 Unilateral primary osteoarthritis, right knee: Principal | ICD-10-CM | POA: Diagnosis present

## 2022-10-01 DIAGNOSIS — E785 Hyperlipidemia, unspecified: Secondary | ICD-10-CM | POA: Diagnosis not present

## 2022-10-01 DIAGNOSIS — K219 Gastro-esophageal reflux disease without esophagitis: Secondary | ICD-10-CM | POA: Diagnosis present

## 2022-10-01 DIAGNOSIS — Z90710 Acquired absence of both cervix and uterus: Secondary | ICD-10-CM

## 2022-10-01 DIAGNOSIS — R0682 Tachypnea, not elsewhere classified: Secondary | ICD-10-CM

## 2022-10-01 DIAGNOSIS — R531 Weakness: Secondary | ICD-10-CM

## 2022-10-01 DIAGNOSIS — R627 Adult failure to thrive: Secondary | ICD-10-CM | POA: Diagnosis present

## 2022-10-01 DIAGNOSIS — S76811A Strain of other specified muscles, fascia and tendons at thigh level, right thigh, initial encounter: Secondary | ICD-10-CM | POA: Diagnosis present

## 2022-10-01 DIAGNOSIS — R5381 Other malaise: Secondary | ICD-10-CM | POA: Diagnosis present

## 2022-10-01 DIAGNOSIS — S81802A Unspecified open wound, left lower leg, initial encounter: Secondary | ICD-10-CM | POA: Diagnosis not present

## 2022-10-01 DIAGNOSIS — E1122 Type 2 diabetes mellitus with diabetic chronic kidney disease: Secondary | ICD-10-CM | POA: Diagnosis present

## 2022-10-01 DIAGNOSIS — R791 Abnormal coagulation profile: Secondary | ICD-10-CM | POA: Diagnosis present

## 2022-10-01 DIAGNOSIS — E66813 Obesity, class 3: Secondary | ICD-10-CM | POA: Diagnosis present

## 2022-10-01 DIAGNOSIS — E7800 Pure hypercholesterolemia, unspecified: Secondary | ICD-10-CM | POA: Diagnosis present

## 2022-10-01 DIAGNOSIS — E1169 Type 2 diabetes mellitus with other specified complication: Secondary | ICD-10-CM | POA: Diagnosis present

## 2022-10-01 DIAGNOSIS — R296 Repeated falls: Principal | ICD-10-CM | POA: Diagnosis present

## 2022-10-01 DIAGNOSIS — I509 Heart failure, unspecified: Secondary | ICD-10-CM

## 2022-10-01 DIAGNOSIS — Z85118 Personal history of other malignant neoplasm of bronchus and lung: Secondary | ICD-10-CM

## 2022-10-01 DIAGNOSIS — Z885 Allergy status to narcotic agent status: Secondary | ICD-10-CM

## 2022-10-01 DIAGNOSIS — Z881 Allergy status to other antibiotic agents status: Secondary | ICD-10-CM

## 2022-10-01 DIAGNOSIS — Z9842 Cataract extraction status, left eye: Secondary | ICD-10-CM

## 2022-10-01 DIAGNOSIS — R609 Edema, unspecified: Secondary | ICD-10-CM | POA: Diagnosis not present

## 2022-10-01 DIAGNOSIS — G4733 Obstructive sleep apnea (adult) (pediatric): Secondary | ICD-10-CM | POA: Diagnosis present

## 2022-10-01 DIAGNOSIS — Z961 Presence of intraocular lens: Secondary | ICD-10-CM | POA: Diagnosis present

## 2022-10-01 DIAGNOSIS — I4510 Unspecified right bundle-branch block: Secondary | ICD-10-CM | POA: Diagnosis present

## 2022-10-01 DIAGNOSIS — M25461 Effusion, right knee: Secondary | ICD-10-CM | POA: Diagnosis present

## 2022-10-01 DIAGNOSIS — Z91199 Patient's noncompliance with other medical treatment and regimen due to unspecified reason: Secondary | ICD-10-CM

## 2022-10-01 DIAGNOSIS — I50813 Acute on chronic right heart failure: Secondary | ICD-10-CM | POA: Diagnosis not present

## 2022-10-01 DIAGNOSIS — S2232XA Fracture of one rib, left side, initial encounter for closed fracture: Secondary | ICD-10-CM | POA: Diagnosis present

## 2022-10-01 DIAGNOSIS — Z7901 Long term (current) use of anticoagulants: Secondary | ICD-10-CM

## 2022-10-01 DIAGNOSIS — R52 Pain, unspecified: Secondary | ICD-10-CM | POA: Diagnosis not present

## 2022-10-01 DIAGNOSIS — Z955 Presence of coronary angioplasty implant and graft: Secondary | ICD-10-CM

## 2022-10-01 DIAGNOSIS — R350 Frequency of micturition: Secondary | ICD-10-CM | POA: Diagnosis present

## 2022-10-01 DIAGNOSIS — I2510 Atherosclerotic heart disease of native coronary artery without angina pectoris: Secondary | ICD-10-CM | POA: Diagnosis present

## 2022-10-01 DIAGNOSIS — I252 Old myocardial infarction: Secondary | ICD-10-CM

## 2022-10-01 LAB — COMPREHENSIVE METABOLIC PANEL
ALT: 18 U/L (ref 0–44)
AST: 30 U/L (ref 15–41)
Albumin: 3.1 g/dL — ABNORMAL LOW (ref 3.5–5.0)
Alkaline Phosphatase: 91 U/L (ref 38–126)
Anion gap: 11 (ref 5–15)
BUN: 16 mg/dL (ref 8–23)
CO2: 25 mmol/L (ref 22–32)
Calcium: 9.5 mg/dL (ref 8.9–10.3)
Chloride: 96 mmol/L — ABNORMAL LOW (ref 98–111)
Creatinine, Ser: 1.19 mg/dL — ABNORMAL HIGH (ref 0.44–1.00)
GFR, Estimated: 49 mL/min — ABNORMAL LOW (ref >60)
Glucose, Bld: 266 mg/dL — ABNORMAL HIGH (ref 70–99)
Potassium: 4.2 mmol/L (ref 3.5–5.1)
Sodium: 132 mmol/L — ABNORMAL LOW (ref 135–145)
Total Bilirubin: 1.3 mg/dL — ABNORMAL HIGH (ref 0.3–1.2)
Total Protein: 7.8 g/dL (ref 6.5–8.1)

## 2022-10-01 LAB — CBC WITH DIFFERENTIAL/PLATELET
Abs Immature Granulocytes: 0.08 10*3/uL — ABNORMAL HIGH (ref 0.00–0.07)
Basophils Absolute: 0.1 10*3/uL (ref 0.0–0.1)
Basophils Relative: 1 %
Eosinophils Absolute: 0.1 10*3/uL (ref 0.0–0.5)
Eosinophils Relative: 1 %
HCT: 36.2 % (ref 36.0–46.0)
Hemoglobin: 11.8 g/dL — ABNORMAL LOW (ref 12.0–15.0)
Immature Granulocytes: 1 %
Lymphocytes Relative: 7 %
Lymphs Abs: 0.9 10*3/uL (ref 0.7–4.0)
MCH: 28.9 pg (ref 26.0–34.0)
MCHC: 32.6 g/dL (ref 30.0–36.0)
MCV: 88.7 fL (ref 80.0–100.0)
Monocytes Absolute: 1.1 10*3/uL — ABNORMAL HIGH (ref 0.1–1.0)
Monocytes Relative: 8 %
Neutro Abs: 11.1 10*3/uL — ABNORMAL HIGH (ref 1.7–7.7)
Neutrophils Relative %: 82 %
Platelets: 278 10*3/uL (ref 150–400)
RBC: 4.08 MIL/uL (ref 3.87–5.11)
RDW: 13.2 % (ref 11.5–15.5)
WBC: 13.3 10*3/uL — ABNORMAL HIGH (ref 4.0–10.5)
nRBC: 0 % (ref 0.0–0.2)

## 2022-10-01 LAB — URINALYSIS, ROUTINE W REFLEX MICROSCOPIC
Bacteria, UA: NONE SEEN
Bilirubin Urine: NEGATIVE
Glucose, UA: 150 mg/dL — AB
Ketones, ur: 5 mg/dL — AB
Leukocytes,Ua: NEGATIVE
Nitrite: NEGATIVE
Protein, ur: NEGATIVE mg/dL
Specific Gravity, Urine: 1.008 (ref 1.005–1.030)
pH: 6 (ref 5.0–8.0)

## 2022-10-01 LAB — PROTIME-INR
INR: 4.2 (ref 0.8–1.2)
Prothrombin Time: 39.9 seconds — ABNORMAL HIGH (ref 11.4–15.2)

## 2022-10-01 LAB — BRAIN NATRIURETIC PEPTIDE: B Natriuretic Peptide: 174.2 pg/mL — ABNORMAL HIGH (ref 0.0–100.0)

## 2022-10-01 LAB — CBG MONITORING, ED: Glucose-Capillary: 249 mg/dL — ABNORMAL HIGH (ref 70–99)

## 2022-10-01 MED ORDER — METOPROLOL SUCCINATE ER 50 MG PO TB24
50.0000 mg | ORAL_TABLET | Freq: Every day | ORAL | Status: DC
  Administered 2022-10-02 – 2022-10-08 (×7): 50 mg via ORAL
  Filled 2022-10-01: qty 1
  Filled 2022-10-01: qty 2
  Filled 2022-10-01 (×5): qty 1

## 2022-10-01 MED ORDER — INSULIN ASPART PROT & ASPART (70-30 MIX) 100 UNIT/ML ~~LOC~~ SUSP
62.0000 [IU] | Freq: Every day | SUBCUTANEOUS | Status: DC
  Administered 2022-10-02 – 2022-10-03 (×2): 62 [IU] via SUBCUTANEOUS
  Filled 2022-10-01: qty 10

## 2022-10-01 MED ORDER — FUROSEMIDE 10 MG/ML IJ SOLN
40.0000 mg | Freq: Two times a day (BID) | INTRAMUSCULAR | Status: DC
  Administered 2022-10-02 – 2022-10-03 (×3): 40 mg via INTRAVENOUS
  Filled 2022-10-01 (×3): qty 4

## 2022-10-01 MED ORDER — HYDROCHLOROTHIAZIDE 25 MG PO TABS
25.0000 mg | ORAL_TABLET | Freq: Every day | ORAL | Status: DC
  Administered 2022-10-02 – 2022-10-03 (×2): 25 mg via ORAL
  Filled 2022-10-01 (×2): qty 1

## 2022-10-01 MED ORDER — ACETAMINOPHEN 325 MG PO TABS
650.0000 mg | ORAL_TABLET | Freq: Four times a day (QID) | ORAL | Status: DC | PRN
  Administered 2022-10-02 – 2022-10-07 (×5): 650 mg via ORAL
  Filled 2022-10-01 (×5): qty 2

## 2022-10-01 MED ORDER — ACETAMINOPHEN 650 MG RE SUPP: 650.0000 mg | Freq: Four times a day (QID) | RECTAL | Status: DC | PRN

## 2022-10-01 MED ORDER — WARFARIN - PHARMACIST DOSING INPATIENT
Freq: Every day | Status: DC
  Administered 2022-10-04: 1

## 2022-10-01 MED ORDER — FUROSEMIDE 10 MG/ML IJ SOLN
40.0000 mg | Freq: Every day | INTRAMUSCULAR | Status: DC
  Administered 2022-10-01: 40 mg via INTRAVENOUS
  Filled 2022-10-01: qty 4

## 2022-10-01 MED ORDER — METOPROLOL TARTRATE 25 MG PO TABS: 25.0000 mg | ORAL_TABLET | ORAL | Status: DC | PRN

## 2022-10-01 MED ORDER — FUROSEMIDE 40 MG PO TABS: 40.0000 mg | ORAL_TABLET | ORAL | Status: DC

## 2022-10-01 MED ORDER — SIMVASTATIN 20 MG PO TABS
40.0000 mg | ORAL_TABLET | Freq: Every day | ORAL | Status: DC
  Administered 2022-10-02 – 2022-10-07 (×6): 40 mg via ORAL
  Filled 2022-10-01 (×6): qty 2

## 2022-10-01 MED ORDER — INSULIN ASPART 100 UNIT/ML IJ SOLN
0.0000 [IU] | Freq: Three times a day (TID) | INTRAMUSCULAR | Status: DC
  Administered 2022-10-02: 1 [IU] via SUBCUTANEOUS
  Administered 2022-10-02: 3 [IU] via SUBCUTANEOUS
  Administered 2022-10-04: 2 [IU] via SUBCUTANEOUS
  Administered 2022-10-04: 5 [IU] via SUBCUTANEOUS
  Administered 2022-10-04: 2 [IU] via SUBCUTANEOUS
  Administered 2022-10-05 (×2): 3 [IU] via SUBCUTANEOUS
  Administered 2022-10-05: 2 [IU] via SUBCUTANEOUS
  Administered 2022-10-06: 3 [IU] via SUBCUTANEOUS
  Administered 2022-10-06: 5 [IU] via SUBCUTANEOUS
  Administered 2022-10-06 – 2022-10-07 (×2): 2 [IU] via SUBCUTANEOUS
  Administered 2022-10-07: 3 [IU] via SUBCUTANEOUS
  Administered 2022-10-07: 8 [IU] via SUBCUTANEOUS
  Administered 2022-10-08 (×2): 2 [IU] via SUBCUTANEOUS

## 2022-10-01 MED ORDER — SPIRONOLACTONE 25 MG PO TABS: 25.0000 mg | ORAL_TABLET | ORAL | Status: DC

## 2022-10-01 MED ORDER — LISINOPRIL 20 MG PO TABS
20.0000 mg | ORAL_TABLET | Freq: Every day | ORAL | Status: DC
  Administered 2022-10-02: 20 mg via ORAL
  Filled 2022-10-01 (×2): qty 1

## 2022-10-01 MED ORDER — HYDROCODONE-ACETAMINOPHEN 5-325 MG PO TABS
1.0000 | ORAL_TABLET | Freq: Once | ORAL | Status: AC
  Administered 2022-10-01: 1 via ORAL
  Filled 2022-10-01: qty 1

## 2022-10-01 MED ORDER — ALBUTEROL SULFATE (2.5 MG/3ML) 0.083% IN NEBU
2.5000 mg | INHALATION_SOLUTION | RESPIRATORY_TRACT | Status: DC | PRN
  Filled 2022-10-01: qty 3

## 2022-10-01 MED ORDER — VITAMIN D 25 MCG (1000 UNIT) PO TABS
2000.0000 [IU] | ORAL_TABLET | Freq: Every day | ORAL | Status: DC
  Administered 2022-10-01 – 2022-10-08 (×8): 2000 [IU] via ORAL
  Filled 2022-10-01 (×8): qty 2

## 2022-10-01 MED ORDER — TRAMADOL HCL 50 MG PO TABS
50.0000 mg | ORAL_TABLET | Freq: Two times a day (BID) | ORAL | Status: DC | PRN
  Administered 2022-10-01 – 2022-10-03 (×3): 50 mg via ORAL
  Filled 2022-10-01 (×3): qty 1

## 2022-10-01 MED ORDER — SODIUM CHLORIDE 0.9 % IV SOLN
1.0000 g | INTRAVENOUS | Status: DC
  Administered 2022-10-01: 1 g via INTRAVENOUS
  Filled 2022-10-01: qty 10

## 2022-10-01 MED ORDER — PANTOPRAZOLE SODIUM 40 MG PO TBEC
40.0000 mg | DELAYED_RELEASE_TABLET | Freq: Every day | ORAL | Status: DC
  Administered 2022-10-01 – 2022-10-07 (×7): 40 mg via ORAL
  Filled 2022-10-01 (×7): qty 1

## 2022-10-01 MED ORDER — LISINOPRIL-HYDROCHLOROTHIAZIDE 20-25 MG PO TABS: 1.0000 | ORAL_TABLET | Freq: Every day | ORAL | Status: DC

## 2022-10-01 MED ORDER — ONDANSETRON HCL 4 MG/2ML IJ SOLN: 4.0000 mg | Freq: Four times a day (QID) | INTRAMUSCULAR | Status: DC | PRN

## 2022-10-01 MED ORDER — ONDANSETRON HCL 4 MG PO TABS: 4.0000 mg | ORAL_TABLET | Freq: Four times a day (QID) | ORAL | Status: DC | PRN

## 2022-10-01 MED ORDER — INSULIN ASPART PROT & ASPART (70-30 MIX) 100 UNIT/ML ~~LOC~~ SUSP
20.0000 [IU] | Freq: Every day | SUBCUTANEOUS | Status: DC
  Administered 2022-10-01 – 2022-10-02 (×2): 20 [IU] via SUBCUTANEOUS
  Filled 2022-10-01: qty 10

## 2022-10-01 NOTE — H&P (Addendum)
History and Physical    [name] BZJ:[id number] DOB: [birth year] DOA: 10/01/2022  PCP: [name], MD  Patient coming from: falls  I have personally briefly reviewed patient's old medical records in [place]  Chief Complaint: recurrent falls at home need placement  HPI: [name] is a 72 y.o. female with medical history significant of  lung ca s/p surgery,hx of  CAD s/p stent LAD, HLD,Paroxysmal atrial fibrillation on warfarin ,  OSA does not wear cpap, CHFpef, PAD with chronic venostasis with b/l lower extremity ulcerations on calves followed by vascular and wound center, type 2 diabetes on insulin,  severe djd of b/l knees s/p TKR on Left,and morbid obesity who has had progressive debility on chronic debility with recurrent falls at home leading to multiple ED visits for falls.  Per patient she states her knees are weak and give out. She also complaint of leg pain due to her venostasis ulcers.  She states due this see is unable to perform he ADLS without risk of falling and family is not able to provide 24 hours care. Family and patient was seen for same in ED on 12/29 and discharged with home health.  Patient and family notes patient mobility issues are severe and due to this she is not safe at home as family is not able to provide 24 hours care.  Patient in reference to her leg swellings states he legs over time have continued to swell but noted with unna boots this swelling improves.  She note no new ulcerations. She also notes no increase sob/ chest pain /fever chills /chest pain /presyncope /n/v/d /dysuria, cough of URI symptoms.   ED Course:  Vitals: afeb, bp 148/72, hr 115, rr 20 sat 100%  Wbc :13.3, hgb 11.8,plt278 NA 132, K 4.2,  CL 96, gly 266, cr 1.19 , TB 1.3 INR:4.2 BNP 174.2/prior 34 CTH:  NAD Cervical spine:NAD Shoulder left/right xray: Nad Mild degenerative changes of the glenohumeral and acromioclavicular joints.  Left rib and chest xray   Displaced fracture of the left sixth rib posteriorly. No obvious pneumothorax on these limited supine radiographs.   Right hip MPRESSION: 1. No acute fracture. 2. Mild bilateral sacroiliac osteoarthritis.    UA neg Tx norco x 1, lasix 40mg   Review of Systems: As per HPI otherwise 10 point review of systems negative.   Past Medical History:  Diagnosis Date   Arthritis    "knees" (02/05/2016)   Cancer ([place]) 5/10   LUL Vats    CHF (congestive heart failure) ([place])    Coronary artery disease    GERD (gastroesophageal reflux disease)    tx meds   H/O hiatal hernia    History of blood transfusion "several"   last april 2015 s/p knee surgery (02/05/2016)   Hypercholesteremia    Hypertension    Lung cancer ([place])    2010, surgery 18% left lung no radiation or chemo   Non-STEMI (non-ST elevated myocardial infarction) ([place]) 04/07/2012   See cath results below   Paroxysmal atrial fibrillation ([place]) 04/07/2012   On Warfarin   Peripheral vascular disease ([place]) 8/12   Lt SFA PTA   Presence of stent in LAD coronary artery 04/07/2012   Xience Expedition DES 2.75 mm x 18 mm (dilated to 3.0 mm)   Sleep apnea    does not wear CPAP   Type II diabetes mellitus (HCC)    insulin dependent    Past Surgical History:  Procedure Laterality Date   ABDOMINAL  HYSTERECTOMY  1990   BREAST EXCISIONAL BIOPSY Right    CARDIAC CATHETERIZATION  11/04/2008   patent RCA, LM, and Circ, nl EF   CARDIAC CATHETERIZATION  02/20/2002   patent coronaries with the only abnormality being a smooth luminla irregularity in the mid intermediate ramus branch no felt to be hemodynamically significant, nl LV   CARDIAC CATHETERIZATION N/A 02/05/2016   Procedure: Left Heart Cath and Coronary Angiography;  Surgeon: Leonie Man, MD;  Location: [facility];  Service: Cardiovascular;  Laterality: N/A;   CARDIAC CATHETERIZATION N/A 02/05/2016   Procedure: Coronary Stent Intervention;  Surgeon: Leonie Man, MD;   Location: [facility];  Service: Cardiovascular;  Laterality: N/A;   CARDIOVERSION N/A 07/16/2021   Procedure: CARDIOVERSION;  Surgeon: Werner Lean, MD;  Location: [facility];  Service: Cardiovascular;  Laterality: N/A;   CATARACT EXTRACTION W/ INTRAOCULAR LENS  IMPLANT, BILATERAL  2013   CORONARY ANGIOPLASTY WITH STENT PLACEMENT  04/07/2012   Xience Expedition DES 2.8mm x 18 mm (dilated to 3.0 mm) to the prox LAD    CORONARY ANGIOPLASTY WITH STENT PLACEMENT  2013; 2015   CORONARY ARTERY BYPASS GRAFT N/A 08/17/2017   Procedure: CORONARY ARTERY BYPASS GRAFTING (CABG) TIMES 1 USING LEFT INTERNAL MAMMARY ARTERY;  Surgeon: Melrose Nakayama, MD;  Location: [facility];  Service: Open Heart Surgery;  Laterality: N/A;   DILATION AND CURETTAGE OF UTERUS  <1990   ESOPHAGOGASTRODUODENOSCOPY N/A 05/23/2015   Procedure: ESOPHAGOGASTRODUODENOSCOPY (EGD);  Surgeon: Carol Ada, MD;  Location: [facility];  Service: Endoscopy;  Laterality: N/A;   ESOPHAGOGASTRODUODENOSCOPY (EGD) WITH PROPOFOL N/A 06/13/2015   Procedure: ESOPHAGOGASTRODUODENOSCOPY (EGD) WITH PROPOFOL;  Surgeon: Carol Ada, MD;  Location: [facility];  Service: Endoscopy;  Laterality: N/A;   ESOPHAGOGASTRODUODENOSCOPY (EGD) WITH PROPOFOL N/A 04/07/2018   Procedure: ESOPHAGOGASTRODUODENOSCOPY (EGD) WITH PROPOFOL;  Surgeon: Carol Ada, MD;  Location[facility];  Service: Endoscopy;  Laterality: N/A;  fluoroscopy is needed   FRACTURE SURGERY     HAMMER TOE SURGERY Bilateral 1993   with screws, on screw removed   JOINT REPLACEMENT     KNEE ARTHROSCOPY Bilateral    left x2, right x1   LAPAROSCOPIC CHOLECYSTECTOMY     LEFT HEART CATH AND CORONARY ANGIOGRAPHY N/A 08/11/2017   Procedure: LEFT HEART CATH AND CORONARY ANGIOGRAPHY;  Surgeon: Martinique, Peter M, MD;  Location: [facility];  Service: Cardiovascular;  Laterality: N/A;   LEFT HEART CATHETERIZATION WITH CORONARY ANGIOGRAM N/A 04/07/2012   Procedure: LEFT  HEART CATHETERIZATION WITH CORONARY ANGIOGRAM;  Surgeon: [name], MD;  Location: [facility];  Service: Cardiovascular;  Laterality: N/A;   LEFT HEART CATHETERIZATION WITH CORONARY ANGIOGRAM N/A 05/27/2014   Procedure: LEFT HEART CATHETERIZATION WITH CORONARY ANGIOGRAM;  Surgeon: [name], MD; LAD 99% ISR, CFX 50-60%, RCA (dominant) no sig dz, EF 60%   LOWER EXTREMITY ANGIOGRAM  05/17/11   directional atherectomy to the prox L SFA using a LX Man TurboHawk, ballooned with a [place] balloon    ORIF ANKLE FRACTURE  07/09/2012   Procedure: OPEN REDUCTION INTERNAL FIXATION (ORIF) ANKLE FRACTURE;  Surgeon: Meredith Pel, MD;  [facility];  Service: Orthopedics;  Laterality: Left;  open reduction internal fixation trimalleolar ankle fracture medial malleolous fixation   PERCUTANEOUS CORONARY STENT INTERVENTION (PCI-S) N/A 04/07/2012   Procedure: PERCUTANEOUS CORONARY STENT INTERVENTION (PCI-S);  Surgeon: [name], MD;  Location: [facility] CATH LAB;  Service: Cardiovascular;  Laterality: N/A;   PERCUTANEOUS CORONARY  STENT INTERVENTION (PCI-S)  05/27/2014   Procedure: PERCUTANEOUS CORONARY STENT INTERVENTION (PCI-S);  Surgeon: [name], MD; 3 mm x 12 mm long Xience Xpedition DES to the proximal LAD   SAVORY DILATION N/A 06/13/2015   Procedure: SAVORY DILATION;  Surgeon: Carol Ada, MD;  [facility];  Service: Endoscopy;  Laterality: N/A;   SAVORY DILATION N/A 04/07/2018   Procedure: SAVORY DILATION;  Surgeon: Carol Ada, MD;  [facility];  Service: Endoscopy;  Laterality: N/A;   TEE WITHOUT CARDIOVERSION N/A 08/17/2017   Procedure: TRANSESOPHAGEAL ECHOCARDIOGRAM (TEE);  Surgeon: Melrose Nakayama, MD;  Location: [facility];  Service: Open Heart Surgery;  Laterality: N/A;   TEE WITHOUT CARDIOVERSION N/A 07/16/2021   Procedure: TRANSESOPHAGEAL ECHOCARDIOGRAM (TEE);  Surgeon: Werner Lean, MD;  Location: [facility] ENDOSCOPY;  Service:  Cardiovascular;  Laterality: N/A;   TONSILLECTOMY     TOTAL KNEE ARTHROPLASTY Left 2003   TOTAL KNEE REVISION Left 11/05/2013   Procedure: LEFT TOTAL KNEE RESECTION;  Surgeon: Mauri Pole, MD;  [facility];  Service: Orthopedics;  Laterality: Left;   TOTAL KNEE REVISION Left 2007   opened in 2006 and cleaned out, 2007 revision   TOTAL KNEE REVISION Left 12/31/2013   Procedure: RE-INPLANTATION LEFT TOTAL KNEE ;  Surgeon: Mauri Pole, MD;  Location: [facility];  Service: Orthopedics;  Laterality: Left;   VIDEO ASSISTED THORACOSCOPY (VATS)/ LOBECTOMY  01/2009     reports that she quit smoking about 38 years ago. Her smoking use included cigarettes. She has a 20.00 pack-year smoking history. She has never been exposed to tobacco smoke. She has never used smokeless tobacco. She reports that she does not drink alcohol and does not use drugs.  Allergies  Allergen Reactions   Levofloxacin Itching   Morphine And Related Itching    Pt reports oxycodone history with no allergy.    Family History  Problem Relation Age of Onset   Hypertension Mother    Coronary artery disease Brother    Hypertension Sister     Prior to Admission medications   Medication Sig Start Date End Date Taking? Authorizing Provider  Blood Glucose Monitoring Suppl ([place]) w/Device KIT  07/23/20   [provider]  Continuous Blood Gluc Receiver (FREESTYLE LIBRE 2 READER) [place] See admin instructions. 11/18/20   [provider]  furosemide (LASIX) 40 MG tablet Take 1 tablet (40 mg total) by mouth 3 (three) times a week. TAKE Monday Wednesday AND FRIDAY 08/11/22   Croitoru, Dani Gobble, MD  insulin NPH-regular Human (70-30) 100 UNIT/ML injection Inject 20-62 Units into the skin 2 (two) times daily with a meal. 62 units in the morning and 20 units at bedtme    [provider]  Lancets (ONETOUCH DELICA PLUS [id number]) [place] Apply topically. 08/13/20   [provider]   lisinopril-hydrochlorothiazide (PRINZIDE,ZESTORETIC) 20-25 MG per tablet Take 1 tablet by mouth daily.    [provider]  metoCLOPramide (REGLAN) 10 MG tablet Take 10 mg by mouth 2 (two) times daily. Per patient    [provider]  metoprolol succinate (TOPROL-XL) 50 MG 24 hr tablet TAKE 1 TABLET BY MOUTH EVERY DAY 11/03/21   Croitoru, Mihai, MD  metoprolol tartrate (LOPRESSOR) 25 MG tablet Take 1 table as needed for heart rate above 120bpm. 07/17/21   Bhagat, Bhavinkumar, PA  ONETOUCH VERIO test strip 1 each 2 (two) times daily. 10/11/20   [provider]  pantoprazole (PROTONIX) 40 MG tablet Take 1 tablet (  40 mg total) by mouth daily. 06/24/22   Croitoru, Mihai, MD  simvastatin (ZOCOR) 40 MG tablet TAKE 1 TABLET BY MOUTH DAILY AT 6 PM. 06/24/22   Croitoru, Mihai, MD  spironolactone (ALDACTONE) 25 MG tablet Take 1 tablet (25 mg total) by mouth 3 (three) times a week. TAKE Monday Wednesday AND Friday 08/11/22   Croitoru, Dani Gobble, MD  traMADol (ULTRAM) 50 MG tablet Take 1 tablet (50 mg total) by mouth every 12 (twelve) hours as needed. 09/01/22   Meredith Pel, MD  Vitamin D, Cholecalciferol, 1000 units TABS Take 1,000 Units by mouth every morning.     [provider]  warfarin (COUMADIN) 7.5 MG tablet TAKE 1/2 TO 1 TABLET DAILY AS DIRECTED BY COUMADIN CLINIC 04/08/22   Croitoru, Dani Gobble, MD    Physical Exam: Vitals:   10/01/22 1437 10/01/22 1645 10/01/22 1730 10/01/22 1832  BP: (!) 148/72 (!) 166/71 (!) 165/70 (!) 150/72  Pulse: (!) 115 (!) 105  (!) 110  Resp: 20 (!) 21  20  Temp: 98.9 F (37.2 C) 98.7 F (37.1 C)    TempSrc: Oral Oral    SpO2: 100% 100%  100%    Constitutional: NAD, calm, comfortable Vitals:   10/01/22 1437 10/01/22 1645 10/01/22 1730 10/01/22 1832  BP: (!) 148/72 (!) 166/71 (!) 165/70 (!) 150/72  Pulse: (!) 115 (!) 105  (!) 110  Resp: 20 (!) 21  20  Temp: 98.9 F (37.2 C) 98.7 F (37.1 C)    TempSrc: Oral Oral    SpO2: 100% 100%   100%   Eyes: PERRL, lids and conjunctivae normal ENMT: Mucous membranes are moist. Posterior pharynx clear of any exudate or lesions.Normal dentition.  Neck: normal, supple, no masses, no thyromegaly Respiratory: clear to auscultation bilaterally, no wheezing, no crackles. Normal respiratory effort. No accessory muscle use.  Cardiovascular: Regular rate and rhythm, no murmurs / rubs / gallops. No extremity edema. 2+ pedal pulses.  Abdomen: no tenderness, no masses palpated. No hepatosplenomegaly. Bowel sounds positive.  Musculoskeletal: no clubbing / cyanosis. No joint deformity upper and lower extremities. Good ROM, no contractures. Normal muscle tone.  Skin: chronic venostasis changes,  noted wounds with drainage left leg  Neurologic: CN 2-12 grossly intact. Sensation intact, Strength 5/5 in all 4.  Psychiatric: Normal judgment and insight. Alert and oriented x 3. Normal mood.    Labs on Admission: I have personally reviewed following labs and imaging studies  CBC: Recent Labs  Lab 09/29/22 1751 10/01/22 1509  WBC 11.3* 13.3*  NEUTROABS 9.2* 11.1*  HGB 11.3* 11.8*  HCT 35.1* 36.2  MCV 88.9 88.7  PLT 250 573   Basic Metabolic Panel: Recent Labs  Lab 09/29/22 1751 10/01/22 1509  NA 132* 132*  K 4.5 4.2  CL 96* 96*  CO2 27 25  GLUCOSE 261* 266*  BUN 29* 16  CREATININE 1.37* 1.19*  CALCIUM 9.5 9.5   GFR: Estimated Creatinine Clearance: 56.8 mL/min (A) (by C-G formula based on SCr of 1.19 mg/dL (H)). Liver Function Tests: Recent Labs  Lab 10/01/22 1509  AST 30  ALT 18  ALKPHOS 91  BILITOT 1.3*  PROT 7.8  ALBUMIN 3.1*   No results for input(s): "LIPASE", "AMYLASE" in the last 168 hours. No results for input(s): "AMMONIA" in the last 168 hours. Coagulation Profile: Recent Labs  Lab 10/01/22 1756  INR 4.2*   Cardiac Enzymes: No results for input(s): "CKTOTAL", "CKMB", "CKMBINDEX", "TROPONINI" in the last 168 hours. BNP (last 3 results) No results  for  input(s): "PROBNP" in the last 8760 hours. HbA1C: No results for input(s): "HGBA1C" in the last 72 hours. CBG: No results for input(s): "GLUCAP" in the last 168 hours. Lipid Profile: No results for input(s): "CHOL", "HDL", "LDLCALC", "TRIG", "CHOLHDL", "LDLDIRECT" in the last 72 hours. Thyroid Function Tests: No results for input(s): "TSH", "T4TOTAL", "FREET4", "T3FREE", "THYROIDAB" in the last 72 hours. Anemia Panel: No results for input(s): "VITAMINB12", "FOLATE", "FERRITIN", "TIBC", "IRON", "RETICCTPCT" in the last 72 hours. Urine analysis:    Component Value Date/Time   COLORURINE YELLOW 10/01/2022 1951   APPEARANCEUR CLEAR 10/01/2022 1951   LABSPEC 1.008 10/01/2022 1951   PHURINE 6.0 10/01/2022 1951   GLUCOSEU 150 (A) 10/01/2022 1951   HGBUR SMALL (A) 10/01/2022 [place] NEGATIVE 10/01/2022 1951   KETONESUR 5 (A) 10/01/2022 1951   PROTEINUR NEGATIVE 10/01/2022 1951   UROBILINOGEN 0.2 12/21/2013 1129   NITRITE NEGATIVE 10/01/2022 1951   LEUKOCYTESUR NEGATIVE 10/01/2022 1951    Radiological Exams on Admission: DG Ribs Unilateral W/Chest Left  Result Date: 10/01/2022 CLINICAL DATA:  pain EXAM: LEFT RIBS AND CHEST - 3+ VIEW COMPARISON:  Chest x-ray July 14, 2021. FINDINGS: Displaced fracture of the left sixth rib posteriorly. No obvious pneumothorax on these limited supine radiographs. No definite consolidation. Polyarticular degenerative change. Cardiomediastinal silhouette is enlarged, similar prior. Median sternotomy. IMPRESSION: Displaced fracture of the left sixth rib posteriorly. No obvious pneumothorax on these limited supine radiographs. Electronically Signed   By: Margaretha Sheffield M.D.   On: 10/01/2022 16:57   DG Hip Unilat With Pelvis 2-3 Views Left  Result Date: 10/01/2022 CLINICAL DATA:  Fall.  Pain. EXAM: DG HIP (WITH OR WITHOUT PELVIS) 2-3V LEFT; DG HIP (WITH OR WITHOUT PELVIS) 2-3V RIGHT COMPARISON:  Right hip radiographs 09/01/2022 FINDINGS: There is  diffuse decreased bone mineralization. Mild bilateral sacroiliac subchondral sclerosis. The bilateral femoroacetabular joint spaces are maintained. No acute fracture is seen. No dislocation. IMPRESSION: 1. No acute fracture. 2. Mild bilateral sacroiliac osteoarthritis. Electronically Signed   By: Yvonne Kendall M.D.   On: 10/01/2022 16:57   DG Hip Unilat With Pelvis 2-3 Views Right  Result Date: 10/01/2022 CLINICAL DATA:  Fall.  Pain. EXAM: DG HIP (WITH OR WITHOUT PELVIS) 2-3V LEFT; DG HIP (WITH OR WITHOUT PELVIS) 2-3V RIGHT COMPARISON:  Right hip radiographs 09/01/2022 FINDINGS: There is diffuse decreased bone mineralization. Mild bilateral sacroiliac subchondral sclerosis. The bilateral femoroacetabular joint spaces are maintained. No acute fracture is seen. No dislocation. IMPRESSION: 1. No acute fracture. 2. Mild bilateral sacroiliac osteoarthritis. Electronically Signed   By: Yvonne Kendall M.D.   On: 10/01/2022 16:57   DG Shoulder Right  Result Date: 10/01/2022 CLINICAL DATA:  Fall with shoulder pain EXAM: RIGHT SHOULDER - 3 VIEW COMPARISON:  Right shoulder radiographs dated 04/22/2008, CT chest dated 07/06/2022 FINDINGS: There is no evidence of fracture or dislocation. Mild degenerative changes of the glenohumeral and acromioclavicular joints. Soft tissues are unremarkable. Linear calcific density projecting inferomedial to the glenoid likely reflects atherosclerotic calcification in this region. Partially imaged calcifications in the right breast. IMPRESSION: 1. No acute fracture or dislocation. 2. Mild degenerative changes of the glenohumeral and acromioclavicular joints. Electronically Signed   By: Darrin Nipper M.D.   On: 10/01/2022 16:56   DG Shoulder Left  Result Date: 10/01/2022 CLINICAL DATA:  Pain, fall. EXAM: LEFT SHOULDER - 2+ VIEW COMPARISON:  None Available. FINDINGS: There is no evidence of fracture or dislocation. Soft tissue calcifications adjacent to the greater tuberosity are likely  related to calcific tendinitis. There is mild degenerative narrowing of the joint spaces. IMPRESSION: 1. No fracture or dislocation. Electronically Signed   By: Ronney Asters M.D.   On: 10/01/2022 16:53   CT Cervical Spine Wo Contrast  Result Date: 10/01/2022 CLINICAL DATA:  [place], right knee pain EXAM: CT CERVICAL SPINE WITHOUT CONTRAST TECHNIQUE: Multidetector CT imaging of the cervical spine was performed without intravenous contrast. Multiplanar CT image reconstructions were also generated. RADIATION DOSE REDUCTION: This exam was performed according to the departmental dose-optimization program which includes automated exposure control, adjustment of the mA and/or kV according to patient size and/or use of iterative reconstruction technique. COMPARISON:  None Available. FINDINGS: Alignment: Alignment is grossly anatomic. Skull base and vertebrae: No acute fracture. No primary bone lesion or focal pathologic process. Soft tissues and spinal canal: No prevertebral fluid or swelling. No visible canal hematoma. Disc levels:  Mild multilevel spondylosis greatest at C5-6. Upper chest: Airway is patent. Visualized portions of the lung apices are clear. Other: Reconstructed images demonstrate no additional findings. IMPRESSION: 1. No acute cervical spine fracture. Electronically Signed   By: Randa Ngo M.D.   On: 10/01/2022 16:10   CT Head Wo Contrast  Result Date: 10/01/2022 CLINICAL DATA:  [place] 2 days ago, anticoagulated EXAM: CT HEAD WITHOUT CONTRAST TECHNIQUE: Contiguous axial images were obtained from the base of the skull through the vertex without intravenous contrast. RADIATION DOSE REDUCTION: This exam was performed according to the departmental dose-optimization program which includes automated exposure control, adjustment of the mA and/or kV according to patient size and/or use of iterative reconstruction technique. COMPARISON:  01/31/2012 FINDINGS: Brain: No acute infarct or hemorrhage. Lateral  ventricles and midline structures are unremarkable. No acute extra-axial fluid collections. No mass effect. Vascular: No hyperdense vessel or unexpected calcification. Skull: Normal. Negative for fracture or focal lesion. Sinuses/Orbits: No acute finding. Other: None. IMPRESSION: 1. No acute intracranial process. Electronically Signed   By: Randa Ngo M.D.   On: 10/01/2022 16:08   DG Pelvis Portable  Result Date: 09/29/2022 CLINICAL DATA:  Pain weakness EXAM: PORTABLE PELVIS 1-2 VIEWS COMPARISON:  11/26/2011, 09/01/2022 FINDINGS: SI joints are non widened. Pubic symphysis is intact. No fracture or malalignment. Vascular calcifications. IMPRESSION: No acute osseous abnormality Electronically Signed   By: Donavan Foil M.D.   On: 09/29/2022 21:15    EKG: Independently reviewed. N/a  Assessment/Plan   Recurrent Fall due chronic progressive debility due to DJD b/l hips/knees -per patient not able to take care of self at home and family not able to provide 24 hour care -unsafe d/c due to falls  -PT to assess rehab needs    Chronic venostasis wounds with concern for possible associated wound infection /cellulitis  -patient with noted drainage, no significant warmth but noted pain  -patient also with increasing wbc count , no other source noted  -f/u on inflammatory markers -place on  ceftriaxone -check mrsa pcr  -monitor wbc count -wound care to see  -increase lasix to assist with lower extremity edema  Displaced fracture of the left sixth rib posteriorly. -asymptomatic no complaint of pain appears subacute   CHFpef  -no increase sob/ cxr clear/ bnp minimally elevated from prior  -not thought to be overt exacerbation  -s/p lasix 40mg  iv in ED -will increase oral dosing to daily and monitor to assist with venostasis monitor and transition back to oral dosing as able  -monitor renal function closely   OSA -not compliant with cpap  -monitor pulse ox  -  O2 qhs   Hx lung ca s/p  surgery\ -in remission   CAD s/p stent LAD -no active issues  -resume metoprolol, statin    HLD -continue statin    Paroxysmal atrial fibrillation - on warfarin  -has supratherapeutic inr at 4.1  -pharmacy consult placed   PAD with chronic venostasis - hx lower extremity ulcerations on calves  -followed by vascular and wound center -wound care to see while in house   Type 2 diabetes  -on insulin, resume home regimen 70/30 bid  -iss/fs    Severe djd of b/l knees/hip -PT/OT -resume home pain regimen    Morbid obesity -complicates prognosis  .DVT prophylaxis: warfarin Code Status: full/ as discussed per patient wishes in event of cardiac arrest  Family Communication: .son at bedsideFarrington,Bernard (Son) [phone] ([place])  Disposition Plan: patient  expected to be admitted greater than 2 midnights  Consults called: wound care Admission status: med tele   Clance Boll MD [facility]   If 7PM-7AM, please contact night-coverage [email or web address] Password [facility]  10/01/2022, 8:48 PM

## 2022-10-01 NOTE — ED Provider Notes (Signed)
[place] EMERGENCY DEPARTMENT Provider Note   CSN: [id number] Arrival date & time: 10/01/22  1426     History  No chief complaint on file.   [name] is a 72 y.o. female with a history of lung cancer in remission, pulmonary nodules, paroxysmal A-fib, congestive heart failure, chronic venous insufficiency, hypertension, type 2 diabetes, presenting to the emergency department with concern for recurring falls.  The patient was seen in the emergency department yesterday evening after falls at home, reporting that her legs have just been edematous and giving out on her.  She had an evaluation performed in the ER and was discharged home with social work consultation and effort to get home health.  Unfortunately within hours of returning home she had another fall again, and difficulty getting off the ground.  There was a discussion yesterday in the emergency department about potential placement into a rehab facility, and although the patient was reluctant at first, she now feels that she is not able to take care of herself at home and is requiring constant care to get around.  HPI     Home Medications Prior to Admission medications   Medication Sig Start Date End Date Taking? Authorizing Provider  Cholecalciferol (VITAMIN D3) 50 MCG (2000 UT) TABS Take 2,000 Units by mouth daily.   Yes [provider]  furosemide (LASIX) 40 MG tablet Take 1 tablet (40 mg total) by mouth 3 (three) times a week. TAKE Monday Wednesday AND FRIDAY Patient taking differently: Take 40 mg by mouth every Monday, Wednesday, and Friday. 08/11/22  Yes Croitoru, Mihai, MD  insulin NPH-regular Human (70-30) 100 UNIT/ML injection Inject 20-62 Units into the skin See admin instructions. Inject 62 units into the skin in the morning and 20 units at bedtime   Yes [provider]  lisinopril-hydrochlorothiazide (PRINZIDE,ZESTORETIC) 20-25 MG per tablet Take 1 tablet by mouth daily.    Yes [provider]  metoprolol succinate (TOPROL-XL) 50 MG 24 hr tablet TAKE 1 TABLET BY MOUTH EVERY DAY Patient taking differently: Take 50 mg by mouth daily. 11/03/21  Yes Croitoru, Mihai, MD  metoprolol tartrate (LOPRESSOR) 25 MG tablet Take 1 table as needed for heart rate above 120bpm. Patient taking differently: Take 25 mg by mouth as needed ("for a heart rate above 120 BPM"). 07/17/21  Yes Bhagat, Bhavinkumar, PA  pantoprazole (PROTONIX) 40 MG tablet Take 1 tablet (40 mg total) by mouth daily. Patient taking differently: Take 40 mg by mouth every evening. 06/24/22  Yes Croitoru, Mihai, MD  simvastatin (ZOCOR) 40 MG tablet TAKE 1 TABLET BY MOUTH DAILY AT 6 PM. Patient taking differently: Take 40 mg by mouth daily at 6 PM. 06/24/22  Yes Croitoru, Mihai, MD  spironolactone (ALDACTONE) 25 MG tablet Take 1 tablet (25 mg total) by mouth 3 (three) times a week. TAKE Monday Wednesday AND Friday Patient taking differently: Take 25 mg by mouth every Monday, Wednesday, and Friday. 08/11/22  Yes Croitoru, Mihai, MD  traMADol (ULTRAM) 50 MG tablet Take 1 tablet (50 mg total) by mouth every 12 (twelve) hours as needed. Patient taking differently: Take 50 mg by mouth every 12 (twelve) hours as needed (for pain). 09/01/22  Yes Meredith Pel, MD  warfarin (COUMADIN) 7.5 MG tablet TAKE 1/2 TO 1 TABLET DAILY AS DIRECTED BY COUMADIN CLINIC Patient taking differently: Take 3.75-7.5 mg by mouth See admin instructions. Take 7.5 mg by mouth in the evening on Sun/Tues/Thurs/Sat and 3.75 mg on Mon/Wed/Fri 04/08/22  Yes  Croitoru, Mihai, MD  Blood Glucose Monitoring Suppl ([place]) w/Device KIT  07/23/20   [provider]  Lancets (ONETOUCH DELICA PLUS [id number]) [place] Apply topically. 08/13/20   [provider]  Glory Rosebush VERIO test strip 1 each 2 (two) times daily. 10/11/20   [provider]      Allergies    Levofloxacin and Morphine and related    Review of  Systems   Review of Systems  Physical Exam Updated Vital Signs BP (!) 150/72   Pulse (!) 110   Temp 98.7 F (37.1 C) (Oral)   Resp 20   SpO2 100%  Physical Exam Constitutional:      General: She is not in acute distress.    Appearance: She is obese.  HENT:     Head: Normocephalic and atraumatic.  Eyes:     Conjunctiva/sclera: Conjunctivae normal.     Pupils: Pupils are equal, round, and reactive to light.  Cardiovascular:     Rate and Rhythm: Normal rate and regular rhythm.  Pulmonary:     Effort: Pulmonary effort is normal. No respiratory distress.     Comments: Tachypnea, elevated respiratory rate Abdominal:     General: There is no distension.     Tenderness: There is no abdominal tenderness.  Musculoskeletal:     Comments: Significant edema of the lower extremities bilaterally with tenderness of the knees and hips Anasarca  Skin:    General: Skin is warm and dry.  Neurological:     General: No focal deficit present.     Mental Status: She is alert. Mental status is at baseline.  Psychiatric:        Mood and Affect: Mood normal.        Behavior: Behavior normal.     ED Results / Procedures / Treatments   Labs (all labs ordered are listed, but only abnormal results are displayed) Labs Reviewed  COMPREHENSIVE METABOLIC PANEL - Abnormal; Notable for the following components:      Result Value   Sodium 132 (*)    Chloride 96 (*)    Glucose, Bld 266 (*)    Creatinine, Ser 1.19 (*)    Albumin 3.1 (*)    Total Bilirubin 1.3 (*)    GFR, Estimated 49 (*)    All other components within normal limits  CBC WITH DIFFERENTIAL/PLATELET - Abnormal; Notable for the following components:   WBC 13.3 (*)    Hemoglobin 11.8 (*)    Neutro Abs 11.1 (*)    Monocytes Absolute 1.1 (*)    Abs Immature Granulocytes 0.08 (*)    All other components within normal limits  URINALYSIS, ROUTINE W REFLEX MICROSCOPIC - Abnormal; Notable for the following components:   Glucose, UA 150  (*)    Hgb urine dipstick SMALL (*)    Ketones, ur 5 (*)    All other components within normal limits  PROTIME-INR - Abnormal; Notable for the following components:   Prothrombin Time 39.9 (*)    INR 4.2 (*)    All other components within normal limits  BRAIN NATRIURETIC PEPTIDE - Abnormal; Notable for the following components:   B Natriuretic Peptide 174.2 (*)    All other components within normal limits    EKG None  Radiology DG Ribs Unilateral W/Chest Left  Result Date: 10/01/2022 CLINICAL DATA:  pain EXAM: LEFT RIBS AND CHEST - 3+ VIEW COMPARISON:  Chest x-ray July 14, 2021. FINDINGS: Displaced fracture of the left sixth rib posteriorly. No  obvious pneumothorax on these limited supine radiographs. No definite consolidation. Polyarticular degenerative change. Cardiomediastinal silhouette is enlarged, similar prior. Median sternotomy. IMPRESSION: Displaced fracture of the left sixth rib posteriorly. No obvious pneumothorax on these limited supine radiographs. Electronically Signed   By: Margaretha Sheffield M.D.   On: 10/01/2022 16:57   DG Hip Unilat With Pelvis 2-3 Views Left  Result Date: 10/01/2022 CLINICAL DATA:  Fall.  Pain. EXAM: DG HIP (WITH OR WITHOUT PELVIS) 2-3V LEFT; DG HIP (WITH OR WITHOUT PELVIS) 2-3V RIGHT COMPARISON:  Right hip radiographs 09/01/2022 FINDINGS: There is diffuse decreased bone mineralization. Mild bilateral sacroiliac subchondral sclerosis. The bilateral femoroacetabular joint spaces are maintained. No acute fracture is seen. No dislocation. IMPRESSION: 1. No acute fracture. 2. Mild bilateral sacroiliac osteoarthritis. Electronically Signed   By: Yvonne Kendall M.D.   On: 10/01/2022 16:57   DG Hip Unilat With Pelvis 2-3 Views Right  Result Date: 10/01/2022 CLINICAL DATA:  Fall.  Pain. EXAM: DG HIP (WITH OR WITHOUT PELVIS) 2-3V LEFT; DG HIP (WITH OR WITHOUT PELVIS) 2-3V RIGHT COMPARISON:  Right hip radiographs 09/01/2022 FINDINGS: There is diffuse decreased bone  mineralization. Mild bilateral sacroiliac subchondral sclerosis. The bilateral femoroacetabular joint spaces are maintained. No acute fracture is seen. No dislocation. IMPRESSION: 1. No acute fracture. 2. Mild bilateral sacroiliac osteoarthritis. Electronically Signed   By: Yvonne Kendall M.D.   On: 10/01/2022 16:57   DG Shoulder Right  Result Date: 10/01/2022 CLINICAL DATA:  Fall with shoulder pain EXAM: RIGHT SHOULDER - 3 VIEW COMPARISON:  Right shoulder radiographs dated 04/22/2008, CT chest dated 07/06/2022 FINDINGS: There is no evidence of fracture or dislocation. Mild degenerative changes of the glenohumeral and acromioclavicular joints. Soft tissues are unremarkable. Linear calcific density projecting inferomedial to the glenoid likely reflects atherosclerotic calcification in this region. Partially imaged calcifications in the right breast. IMPRESSION: 1. No acute fracture or dislocation. 2. Mild degenerative changes of the glenohumeral and acromioclavicular joints. Electronically Signed   By: Darrin Nipper M.D.   On: 10/01/2022 16:56   DG Shoulder Left  Result Date: 10/01/2022 CLINICAL DATA:  Pain, fall. EXAM: LEFT SHOULDER - 2+ VIEW COMPARISON:  None Available. FINDINGS: There is no evidence of fracture or dislocation. Soft tissue calcifications adjacent to the greater tuberosity are likely related to calcific tendinitis. There is mild degenerative narrowing of the joint spaces. IMPRESSION: 1. No fracture or dislocation. Electronically Signed   By: Ronney Asters M.D.   On: 10/01/2022 16:53   CT Cervical Spine Wo Contrast  Result Date: 10/01/2022 CLINICAL DATA:  [place], right knee pain EXAM: CT CERVICAL SPINE WITHOUT CONTRAST TECHNIQUE: Multidetector CT imaging of the cervical spine was performed without intravenous contrast. Multiplanar CT image reconstructions were also generated. RADIATION DOSE REDUCTION: This exam was performed according to the departmental dose-optimization program which includes  automated exposure control, adjustment of the mA and/or kV according to patient size and/or use of iterative reconstruction technique. COMPARISON:  None Available. FINDINGS: Alignment: Alignment is grossly anatomic. Skull base and vertebrae: No acute fracture. No primary bone lesion or focal pathologic process. Soft tissues and spinal canal: No prevertebral fluid or swelling. No visible canal hematoma. Disc levels:  Mild multilevel spondylosis greatest at C5-6. Upper chest: Airway is patent. Visualized portions of the lung apices are clear. Other: Reconstructed images demonstrate no additional findings. IMPRESSION: 1. No acute cervical spine fracture. Electronically Signed   By: Randa Ngo M.D.   On: 10/01/2022 16:10   CT Head Wo Contrast  Result Date:  10/01/2022 CLINICAL DATA:  [place] 2 days ago, anticoagulated EXAM: CT HEAD WITHOUT CONTRAST TECHNIQUE: Contiguous axial images were obtained from the base of the skull through the vertex without intravenous contrast. RADIATION DOSE REDUCTION: This exam was performed according to the departmental dose-optimization program which includes automated exposure control, adjustment of the mA and/or kV according to patient size and/or use of iterative reconstruction technique. COMPARISON:  01/31/2012 FINDINGS: Brain: No acute infarct or hemorrhage. Lateral ventricles and midline structures are unremarkable. No acute extra-axial fluid collections. No mass effect. Vascular: No hyperdense vessel or unexpected calcification. Skull: Normal. Negative for fracture or focal lesion. Sinuses/Orbits: No acute finding. Other: None. IMPRESSION: 1. No acute intracranial process. Electronically Signed   By: Randa Ngo M.D.   On: 10/01/2022 16:08    Procedures Procedures    Medications Ordered in ED Medications  furosemide (LASIX) injection 40 mg (has no administration in time range)  HYDROcodone-acetaminophen (NORCO/VICODIN) 5-325 MG per tablet 1 tablet (1 tablet Oral Given  10/01/22 1647)    ED Course/ Medical Decision Making/ A&P Clinical Course as of 10/01/22 2127  Fri Oct 01, 2022  1754 UA on 09/29/22 with no evidence of infection [MT]    Clinical Course User Index [MT] Wyvonnia Dusky, MD                           Medical Decision Making Amount and/or Complexity of Data Reviewed Labs: ordered.  Risk Prescription drug management. Decision regarding hospitalization.   This patient presents to the ED with concern for generalized weakness and frequent falls; also shortness of breath and peripheral edema.  This involves an extensive number of treatment options, and is a complaint that carries with it a high risk of complications and morbidity.  The differential diagnosis includes edema due to venous insufficiency or vascular insufficiency as the most likely cause of her falls.  Her shortness of breath could be multifactorial but related to congestive heart failure exacerbation versus pleural effusion versus pneumonia versus other  Co-morbidities that complicate the patient evaluation: Diabetes, obesity, sedentary lifestyle, limited mobility  Additional history obtained from EMS  I ordered and personally interpreted labs.  The pertinent results include:   CT and x-ray imaging was ordered from triage for generalized trauma evaluation I independently visualized and interpreted imaging which showed Single displaced fracture of the left posterior sixth rib, with no pneumothorax.  Arthritis noted of the sacroiliac joint and the knees consistent with patient's body habitus.  No bleeding on CT head or acute cervical fracture I agree with the radiologist interpretation  The patient does not have tenderness on exam that would correlate with a rib fracture.  She is not having posterior chest pain.  It is not clear the chronicity of this rib fracture.  The patient was maintained on a cardiac monitor.  I personally viewed and interpreted the cardiac monitored which  showed an underlying rhythm of: sinus tachycardia  I ordered medication including IV Lasix for diuresis for tachypnea felt to be secondary to pulmonary edema or congestive heart failure.  She also has anasarca on exam.  Test Considered: I will lower suspicion for septic joint or acute pulmonary embolism or DVT and do not feel that this type of emergent imaging or arthrocentesis was indicated at this time    Dispostion:  After repeat evaluation I suspect the patient is experiencing volume overload and tachypnea consistent with a congestive heart failure exacerbation.  She would benefit  from inpatient management and IV diuresis.        Final Clinical Impression(s) / ED Diagnoses Final diagnoses:  Frequent falls  Closed fracture of one rib of left side, initial encounter  Weakness  Tachypnea  Acute on chronic congestive heart failure, unspecified heart failure type [facility])    Rx / DC Orders ED Discharge Orders     None         Wyvonnia Dusky, MD 10/01/22 2127

## 2022-10-01 NOTE — Telephone Encounter (Signed)
        Patient  visited [facility] on 1/3    Telephone encounter attempt :  1st  A HIPAA compliant voice message was left requesting a return call.  Instructed patient to call back.    [facility] [place], Care Management  [phone] [address], [place], [place] [address] Phone: [phone] Email: [email or web address]

## 2022-10-01 NOTE — ED Triage Notes (Signed)
Pt came in via [facility]  d/t chronic Rt knee pain for the past couple weeks. Was seen at  [facility] & was told pain was from "improper blood flow" & was d/c back to home Wed this week. The day she was d/c she fell in door way on same knee. Her pain worsened since the fall & she can't stand & has since been having difficulty caring for self. A/Ox4, 168/88, 102 bpm, CBG 292.

## 2022-10-01 NOTE — ED Provider Triage Note (Signed)
Emergency Medicine Provider Triage Evaluation Note  [name] , a 72 y.o. female  was evaluated in triage.  Pt complains of fall.  Patient reports chronic and right knee pain of which was seen most recently 2 days ago at [facility].  She has known bone-on-bone arthritis in her right knee as well as pain from chronic venous insufficiency in bilateral lower extremities.  Patient experienced fall Wednesday as well as Thursday.  Reports "knee giving out on her."  Currently complaining of bilateral knee pain, bilateral shoulder pain, left-sided rib pain.  Patient is anticoagulated on Coumadin.  Denies trauma to head or loss of consciousness.  States that pain has been worse in both of her knees since the fall.  States that she has outpatient physical therapy set up but feels like she is having difficulty caring for herself at home.  Review of Systems  Positive: See above Negative:   Physical Exam  BP (!) 148/72 (BP Location: Right Arm)   Pulse (!) 115   Temp 98.9 F (37.2 C) (Oral)   Resp 20   SpO2 100%  Gen:   Awake, no distress   Resp:  Normal effort  MSK:   Moves extremities without difficulty  Other:  Bilateral shoulder tenderness.  Bilateral knee tenderness.  Bilateral lower extremity edema.  Pedal pulses found via Doppler.  No abdominal tenderness.  Left-sided chest tenderness.  No midline tenderness of cervical, thoracic, lumbar spine with no obvious step-off or deformity appreciated.  Cranial nerves III through XII grossly intact.  Medical Decision Making  Medically screening exam initiated at 3:10 PM.  Appropriate orders placed.  [name] was informed that the remainder of the evaluation will be completed by another provider, this initial triage assessment does not replace that evaluation, and the importance of remaining in the ED until their evaluation is complete.     Wilnette Kales, [place] 10/01/22 1515

## 2022-10-01 NOTE — Progress Notes (Signed)
ANTICOAGULATION CONSULT NOTE - Initial Consult  Pharmacy Consult for warfarin  Indication: atrial fibrillation  Allergies  Allergen Reactions   Levofloxacin Itching   Morphine And Related Itching and Other (See Comments)    Pt reports oxycodone history with no allergy.    Vital Signs: Temp: 98.7 F (37.1 C) (01/05 1645) Temp Source: Oral (01/05 1645) BP: 150/72 (01/05 1832) Pulse Rate: 110 (01/05 1832)  Labs: Recent Labs    09/29/22 1751 10/01/22 1509 10/01/22 1756  HGB 11.3* 11.8*  --   HCT 35.1* 36.2  --   PLT 250 278  --   LABPROT  --   --  39.9*  INR  --   --  4.2*  CREATININE 1.37* 1.19*  --     Estimated Creatinine Clearance: 56.8 mL/min (A) (by C-G formula based on SCr of 1.19 mg/dL (H)).   Medical History: Past Medical History:  Diagnosis Date   Arthritis    "knees" (02/05/2016)   Cancer ([place]) 5/10   LUL Vats    CHF (congestive heart failure) (HCC)    Coronary artery disease    GERD (gastroesophageal reflux disease)    tx meds   H/O hiatal hernia    History of blood transfusion "several"   last april 2015 s/p knee surgery (02/05/2016)   Hypercholesteremia    Hypertension    Lung cancer ([place])    2010, surgery 18% left lung no radiation or chemo   Non-STEMI (non-ST elevated myocardial infarction) ([place]) 04/07/2012   See cath results below   Paroxysmal atrial fibrillation ([place]) 04/07/2012   On Warfarin   Peripheral vascular disease ([place]) 8/12   Lt SFA PTA   Presence of stent in LAD coronary artery 04/07/2012   Xience Expedition DES 2.75 mm x 18 mm (dilated to 3.0 mm)   Sleep apnea    does not wear CPAP   Type II diabetes mellitus (HCC)    insulin dependent   Assessment: Patient admitted with CC of knee pain and recurring falls. History of afib on warfarin. PTA patient was taking warfarin 7.5mg  daily except 1/2 tablet on M/W/F.   Goal of Therapy:  INR 2-3 Monitor platelets by anticoagulation protocol: Yes   Plan:  INR today supra-therapeutic at  4.2, hold warfarin dose.  Daily INR.   Esmeralda Arthur, PharmD, BCCCP  10/01/2022,9:48 PM

## 2022-10-01 NOTE — ED Notes (Signed)
Thomas MD paged regarding pts critical INR 4.2

## 2022-10-02 DIAGNOSIS — I50813 Acute on chronic right heart failure: Secondary | ICD-10-CM

## 2022-10-02 LAB — COMPREHENSIVE METABOLIC PANEL
ALT: 16 U/L (ref 0–44)
AST: 28 U/L (ref 15–41)
Albumin: 2.6 g/dL — ABNORMAL LOW (ref 3.5–5.0)
Alkaline Phosphatase: 80 U/L (ref 38–126)
Anion gap: 11 (ref 5–15)
BUN: 15 mg/dL (ref 8–23)
CO2: 28 mmol/L (ref 22–32)
Calcium: 9.2 mg/dL (ref 8.9–10.3)
Chloride: 95 mmol/L — ABNORMAL LOW (ref 98–111)
Creatinine, Ser: 1.22 mg/dL — ABNORMAL HIGH (ref 0.44–1.00)
GFR, Estimated: 47 mL/min — ABNORMAL LOW (ref >60)
Glucose, Bld: 137 mg/dL — ABNORMAL HIGH (ref 70–99)
Potassium: 3.8 mmol/L (ref 3.5–5.1)
Sodium: 134 mmol/L — ABNORMAL LOW (ref 135–145)
Total Bilirubin: 0.8 mg/dL (ref 0.3–1.2)
Total Protein: 6.3 g/dL — ABNORMAL LOW (ref 6.5–8.1)

## 2022-10-02 LAB — CBC
HCT: 30.7 % — ABNORMAL LOW (ref 36.0–46.0)
Hemoglobin: 10 g/dL — ABNORMAL LOW (ref 12.0–15.0)
MCH: 28.6 pg (ref 26.0–34.0)
MCHC: 32.6 g/dL (ref 30.0–36.0)
MCV: 87.7 fL (ref 80.0–100.0)
Platelets: 270 10*3/uL (ref 150–400)
RBC: 3.5 MIL/uL — ABNORMAL LOW (ref 3.87–5.11)
RDW: 13.1 % (ref 11.5–15.5)
WBC: 10.6 10*3/uL — ABNORMAL HIGH (ref 4.0–10.5)
nRBC: 0 % (ref 0.0–0.2)

## 2022-10-02 LAB — CBG MONITORING, ED
Glucose-Capillary: 175 mg/dL — ABNORMAL HIGH (ref 70–99)
Glucose-Capillary: 189 mg/dL — ABNORMAL HIGH (ref 70–99)

## 2022-10-02 LAB — GLUCOSE, CAPILLARY
Glucose-Capillary: 129 mg/dL — ABNORMAL HIGH (ref 70–99)
Glucose-Capillary: 127 mg/dL — ABNORMAL HIGH (ref 70–99)

## 2022-10-02 LAB — PROTIME-INR
INR: 4.6 (ref 0.8–1.2)
Prothrombin Time: 42.8 seconds — ABNORMAL HIGH (ref 11.4–15.2)

## 2022-10-02 LAB — PROCALCITONIN: Procalcitonin: 0.13 ng/mL

## 2022-10-02 LAB — C-REACTIVE PROTEIN: CRP: 12.5 mg/dL — ABNORMAL HIGH (ref <1.0)

## 2022-10-02 MED ORDER — SODIUM CHLORIDE 0.9 % IV SOLN
2.0000 g | INTRAVENOUS | Status: DC
  Administered 2022-10-02 – 2022-10-04 (×3): 2 g via INTRAVENOUS
  Filled 2022-10-02 (×3): qty 20

## 2022-10-02 MED ORDER — LINEZOLID 600 MG PO TABS
600.0000 mg | ORAL_TABLET | Freq: Two times a day (BID) | ORAL | Status: DC
  Administered 2022-10-02 – 2022-10-05 (×6): 600 mg via ORAL
  Filled 2022-10-02 (×7): qty 1

## 2022-10-02 MED ORDER — HYDROCODONE-ACETAMINOPHEN 5-325 MG PO TABS
1.0000 | ORAL_TABLET | Freq: Once | ORAL | Status: AC
  Administered 2022-10-02: 1 via ORAL
  Filled 2022-10-02: qty 1

## 2022-10-02 NOTE — ED Notes (Signed)
Dr. Marthenia Rolling notified that INR is continuing to trend up. Latest reading is 4.6 up from 4.2

## 2022-10-02 NOTE — ED Notes (Signed)
Pt transferred from stretcher to bariatric bed for comfort with minimal improvement. Ulcerations on back of legs dressed with nonadherent dressing and guaze. Foam guaze applied to R heel per patients request. Marcello Moores MD made aware of pts increased pain

## 2022-10-02 NOTE — Progress Notes (Signed)
ANTICOAGULATION CONSULT NOTE - Initial Consult  Pharmacy Consult for warfarin  Indication: atrial fibrillation  Allergies  Allergen Reactions   Levofloxacin Itching   Morphine And Related Itching and Other (See Comments)    Pt reports oxycodone history with no allergy.    Vital Signs: Temp: 98.8 F (37.1 C) (01/06 1058) Temp Source: Oral (01/06 1058) BP: 113/59 (01/06 1015) Pulse Rate: 120 (01/06 1015)  Labs: Recent Labs    09/29/22 1751 10/01/22 1509 10/01/22 1756 10/02/22 0512  HGB 11.3* 11.8*  --  10.0*  HCT 35.1* 36.2  --  30.7*  PLT 250 278  --  270  LABPROT  --   --  39.9* 42.8*  INR  --   --  4.2* 4.6*  CREATININE 1.37* 1.19*  --   --      Estimated Creatinine Clearance: 56.8 mL/min (A) (by C-G formula based on SCr of 1.19 mg/dL (H)).   Medical History: Past Medical History:  Diagnosis Date   Arthritis    "knees" (02/05/2016)   Cancer ([place]) 5/10   LUL Vats    CHF (congestive heart failure) (HCC)    Coronary artery disease    GERD (gastroesophageal reflux disease)    tx meds   H/O hiatal hernia    History of blood transfusion "several"   last april 2015 s/p knee surgery (02/05/2016)   Hypercholesteremia    Hypertension    Lung cancer ([place])    2010, surgery 18% left lung no radiation or chemo   Non-STEMI (non-ST elevated myocardial infarction) ([place]) 04/07/2012   See cath results below   Paroxysmal atrial fibrillation ([place]) 04/07/2012   On Warfarin   Peripheral vascular disease ([place]) 8/12   Lt SFA PTA   Presence of stent in LAD coronary artery 04/07/2012   Xience Expedition DES 2.75 mm x 18 mm (dilated to 3.0 mm)   Sleep apnea    does not wear CPAP   Type II diabetes mellitus (HCC)    insulin dependent   Assessment: Patient admitted with CC of knee pain and recurring falls. History of afib on warfarin. PTA patient was taking warfarin 7.5mg  daily except 1/2 tablet on M/W/F.   Goal of Therapy:  INR 2-3 Monitor platelets by anticoagulation  protocol: Yes   Plan:  INR today remains supra-therapeutic at 4.6, hold warfarin dose.  Daily INR.   Lorelei Pont, PharmD, BCPS 10/02/2022 11:00 AM ED Clinical Pharmacist -  [phone]

## 2022-10-02 NOTE — Evaluation (Signed)
Physical Therapy Evaluation Patient Details Name: [name] MRN: [id number] DOB: [birth year] Today's Date: 10/02/2022  History of Present Illness  Pt is a 72 y.o. female who presented 10/01/22 with weakness and multiple falls. Imaging revealed a displaced fracture of the left sixth rib posteriorly. PMH: lung ca s/p surgery, hx of CAD s/p stent LAD, HLD, paroxysmal afib on warfarin, OSA, CHF, PAD with chronic venostasis with b/l lower extremity ulcerations on calves, DM2, obesity, severe DJD of bil knees s/p TKR on left, non-STEMI   Clinical Impression  Pt presents with condition above and deficits mentioned below, see PT Problem List. PTA, she was living with her son (who works 3rd shift and thus has to sleep majority of the day) in a 1-level house with 1 STE. At baseline, pt was mod I using a rollator for short household distance mobility, but needed assistance for bathing, shopping, cooking, and cleaning. She endorses x3 in the past 6 months. Currently, pt is fearful of falling and also limited by her bil calf wound pain. In addition, she demonstrates deficits in overall strength, activity tolerance, balance, and power, needing mod-maxA for bed mobility and modA to come to stand briefly with a very flexed posture while using a RW. As pt does not have the needed assistance available at home and is functioning below her baseline, recommending rehab at a SNF. Will continue to follow acutely.     Recommendations for follow up therapy are one component of a multi-disciplinary discharge planning process, led by the attending physician.  Recommendations may be updated based on patient status, additional functional criteria and insurance authorization.  Follow Up Recommendations Skilled nursing-short term rehab (<3 hours/day) Can patient physically be transported by private vehicle: No    Assistance Recommended at Discharge Intermittent Supervision/Assistance  Patient can return home with the  following  Two people to help with walking and/or transfers;A lot of help with bathing/dressing/bathroom;Assistance with cooking/housework;Assist for transportation;Help with stairs or ramp for entrance    Equipment Recommendations Hospital bed;Wheelchair (measurements PT);Wheelchair cushion (measurements PT);Other (comment) (bariatric; hoyer)  Recommendations for Other Services  OT consult    Functional Status Assessment Patient has had a recent decline in their functional status and demonstrates the ability to make significant improvements in function in a reasonable and predictable amount of time.     Precautions / Restrictions Precautions Precautions: Fall;Other (comment) Precaution Comments: watch HR; bil calf wounds Restrictions Weight Bearing Restrictions: No      Mobility  Bed Mobility Overal bed mobility: Needs Assistance Bed Mobility: Supine to Sit, Sit to Supine     Supine to sit: Mod assist, HOB elevated Sit to supine: Max assist, HOB elevated   General bed mobility comments: Pt needing assistance to manage each leg in and out of bed, step-by-step. ModA to manage legs and some assistance to ascend trunk to sit up R EOB using bed rails. MaxA to manage legs and trunk to supine and midline in bed.    Transfers Overall transfer level: Needs assistance Equipment used: Rolling walker (2 wheels) Transfers: Sit to/from Stand Sit to Stand: Mod assist           General transfer comment: x2 attempts before successful in clearing buttocks with modA and pt maintaining a very flexed posture.    Ambulation/Gait               General Gait Details: unable  Science writer  Modified Rankin (Stroke Patients Only)       Balance                                             Pertinent Vitals/Pain Pain Assessment Pain Assessment: Faces Faces Pain Scale: Hurts even more Pain Location: bil calves at wounds Pain  Descriptors / Indicators: Discomfort, Grimacing, Guarding Pain Intervention(s): Limited activity within patient's tolerance, Monitored during session, Repositioned, Patient requesting pain meds-RN notified    Home Living Family/patient expects to be discharged to:: Private residence Living Arrangements: Children (son who works 3rd shift) Available Help at Discharge: Family;Available PRN/intermittently (son works 3rd shift and is asleep majority of day) Type of Home: House Home Access: Stairs to enter Entrance Stairs-Rails: None Entrance Stairs-Number of Steps: 1 (small step)   Home Layout: One level Home Equipment: Conservation officer, nature (2 wheels);Rollator (4 wheels);BSC/3in1;Shower seat;Wheelchair - Psychologist, educational (w/c too narrow (from neighbor))      Prior Function Prior Level of Function : Needs assist;Driving       Physical Assist : ADLs (physical)   ADLs (physical): Bathing;IADLs Mobility Comments: Uses rollator for short household distances. Multiple falls, x2 in past month and x3 in past 6 months. Only uses car to get to appointments, does not do shopping. ADLs Comments: Son does set-up for sponge baths as pt reports difficulty walking to bathroom to shower. She does drive. She dresses herself and uses AE for pericare. Son does cooking and cleaning. She manages her own meds and finances. Only uses car to get to appointments, does not do shopping.     Hand Dominance        Extremity/Trunk Assessment   Upper Extremity Assessment Upper Extremity Assessment: Defer to OT evaluation    Lower Extremity Assessment Lower Extremity Assessment: Generalized weakness (with bil calf wounds)    Cervical / Trunk Assessment Cervical / Trunk Assessment: Other exceptions Cervical / Trunk Exceptions: increased body habitus  Communication   Communication: HOH  Cognition Arousal/Alertness: Awake/alert Behavior During Therapy: Anxious Overall Cognitive Status: Within Functional  Limits for tasks assessed                                 General Comments: Anxious in regards to standing due to reported fear of falling        General Comments General comments (skin integrity, edema, etc.): HR varied up to 140s and then dropped to 110s throughout    Exercises     Assessment/Plan    PT Assessment Patient needs continued PT services  PT Problem List Decreased strength;Decreased activity tolerance;Decreased balance;Decreased mobility;Decreased range of motion;Obesity;Decreased skin integrity;Pain       PT Treatment Interventions Gait training;DME instruction;Functional mobility training;Therapeutic activities;Therapeutic exercise;Neuromuscular re-education;Balance training;Patient/family education;Wheelchair mobility training    PT Goals (Current goals can be found in the Care Plan section)  Acute Rehab PT Goals Patient Stated Goal: to get stronger PT Goal Formulation: With patient Time For Goal Achievement: 10/16/22 Potential to Achieve Goals: Good    Frequency Min 2X/week     Co-evaluation               AM-PAC PT "6 Clicks" Mobility  Outcome Measure Help needed turning from your back to your side while in a flat bed without using bedrails?: A Lot Help needed moving from lying  on your back to sitting on the side of a flat bed without using bedrails?: A Lot Help needed moving to and from a bed to a chair (including a wheelchair)?: Total Help needed standing up from a chair using your arms (e.g., wheelchair or bedside chair)?: A Lot Help needed to walk in hospital room?: Total Help needed climbing 3-5 steps with a railing? : Total 6 Click Score: 9    End of Session Equipment Utilized During Treatment: Gait belt Activity Tolerance: Patient limited by pain Patient left: in bed;with call bell/phone within reach;with bed alarm set Nurse Communication: Mobility status PT Visit Diagnosis: Unsteadiness on feet (R26.81);Muscle weakness  (generalized) (M62.81);Difficulty in walking, not elsewhere classified (R26.2);History of falling (Z91.81)    Time: 1010-1056 PT Time Calculation (min) (ACUTE ONLY): 46 min   Charges:   PT Evaluation $PT Eval Moderate Complexity: 1 Mod PT Treatments $Therapeutic Activity: 23-37 mins        Moishe Spice, PT, DPT Acute Rehabilitation Services  Office: [phone]   Orvan Falconer 10/02/2022, 1:07 PM

## 2022-10-02 NOTE — ED Notes (Signed)
PT just finished working with pt...  PT had some pain before, more after.

## 2022-10-02 NOTE — ED Notes (Signed)
Critical INR of 4.6 called from lab and reported to [facility]

## 2022-10-02 NOTE — ED Notes (Signed)
ED TO INPATIENT HANDOFF REPORT  ED Nurse Name and Phone #: Iona Coach Name/Age/Gender [name] 72 y.o. female Room/Bed: 044C/044C  Code Status   Code Status: Full Code  Home/SNF/Other Home Patient oriented to: self, place, time, and situation Is this baseline? Yes   Triage Complete: Triage complete  Chief Complaint CHF (congestive heart failure) ([place]) [I50.9]  Triage Note Pt came in via [facility]  d/t chronic Rt knee pain for the past couple weeks. Was seen at  [facility] & was told pain was from "improper blood flow" & was d/c back to home Wed this week. The day she was d/c she fell in door way on same knee. Her pain worsened since the fall & she can't stand & has since been having difficulty caring for self. A/Ox4, 168/88, 102 bpm, CBG 292.    Allergies Allergies  Allergen Reactions   Levofloxacin Itching   Morphine And Related Itching and Other (See Comments)    Pt reports oxycodone history with no allergy.    Level of Care/Admitting Diagnosis ED Disposition     ED Disposition  Admit   Condition  --   Comment  Hospital Area: [place] [[id number]]  Level of Care: Telemetry Medical [104]  May admit patient to [facility] or [facility] if equivalent level of care is available:: Yes  Covid Evaluation: Symptomatic Person Under Investigation (PUI) or recent exposure (last 10 days) *Testing Required*  Diagnosis: CHF (congestive heart failure) [facility]) [[id number]]  Admitting Physician: Clance Boll [[id number]]  Attending Physician: Clance Boll [[id number]]  Certification:: I certify this patient will need inpatient services for at least 2 midnights  Estimated Length of Stay: 3          B Medical/Surgery History Past Medical History:  Diagnosis Date   Arthritis    "knees" (02/05/2016)   Cancer ([place]) 5/10   LUL Vats    CHF (congestive heart failure) ([place])    Coronary artery disease    GERD (gastroesophageal reflux disease)    tx meds   H/O  hiatal hernia    History of blood transfusion "several"   last april 2015 s/p knee surgery (02/05/2016)   Hypercholesteremia    Hypertension    Lung cancer ([place])    2010, surgery 18% left lung no radiation or chemo   Non-STEMI (non-ST elevated myocardial infarction) ([place]) 04/07/2012   See cath results below   Paroxysmal atrial fibrillation ([place]) 04/07/2012   On Warfarin   Peripheral vascular disease ([place]) 8/12   Lt SFA PTA   Presence of stent in LAD coronary artery 04/07/2012   Xience Expedition DES 2.75 mm x 18 mm (dilated to 3.0 mm)   Sleep apnea    does not wear CPAP   Type II diabetes mellitus ([place])    insulin dependent   Past Surgical History:  Procedure Laterality Date   ABDOMINAL HYSTERECTOMY  1990   BREAST EXCISIONAL BIOPSY Right    CARDIAC CATHETERIZATION  11/04/2008   patent RCA, LM, and Circ, nl EF   CARDIAC CATHETERIZATION  02/20/2002   patent coronaries with the only abnormality being a smooth luminla irregularity in the mid intermediate ramus branch no felt to be hemodynamically significant, nl LV   CARDIAC CATHETERIZATION N/A 02/05/2016   Procedure: Left Heart Cath and Coronary Angiography;  Surgeon: Leonie Man, MD;  Location: [facility] CV LAB;  Service: Cardiovascular;  Laterality: N/A;   CARDIAC CATHETERIZATION N/A 02/05/2016   Procedure: Coronary  Stent Intervention;  Surgeon: Leonie Man, MD;  Location: [facility] CV LAB;  Service: Cardiovascular;  Laterality: N/A;   CARDIOVERSION N/A 07/16/2021   Procedure: CARDIOVERSION;  Surgeon: Werner Lean, MD;  Location: [facility] ENDOSCOPY;  Service: Cardiovascular;  Laterality: N/A;   CATARACT EXTRACTION W/ INTRAOCULAR LENS  IMPLANT, BILATERAL  2013   CORONARY ANGIOPLASTY WITH STENT PLACEMENT  04/07/2012   Xience Expedition DES 2.37mm x 18 mm (dilated to 3.0 mm) to the prox LAD    CORONARY ANGIOPLASTY WITH STENT PLACEMENT  2013; 2015   CORONARY ARTERY BYPASS GRAFT N/A 08/17/2017   Procedure: CORONARY ARTERY  BYPASS GRAFTING (CABG) TIMES 1 USING LEFT INTERNAL MAMMARY ARTERY;  Surgeon: Melrose Nakayama, MD;  Location: [facility];  Service: Open Heart Surgery;  Laterality: N/A;   DILATION AND CURETTAGE OF UTERUS  <1990   ESOPHAGOGASTRODUODENOSCOPY N/A 05/23/2015   Procedure: ESOPHAGOGASTRODUODENOSCOPY (EGD);  Surgeon: Carol Ada, MD;  Location: [facility];  Service: Endoscopy;  Laterality: N/A;   ESOPHAGOGASTRODUODENOSCOPY (EGD) WITH PROPOFOL N/A 06/13/2015   Procedure: ESOPHAGOGASTRODUODENOSCOPY (EGD) WITH PROPOFOL;  Surgeon: Carol Ada, MD;  [facility];  Service: Endoscopy;  Laterality: N/A;   ESOPHAGOGASTRODUODENOSCOPY (EGD) WITH PROPOFOL N/A 04/07/2018   Procedure: ESOPHAGOGASTRODUODENOSCOPY (EGD) WITH PROPOFOL;  Surgeon: Carol Ada, MD;  [facility];  Service: Endoscopy;  Laterality: N/A;  fluoroscopy is needed   FRACTURE SURGERY     HAMMER TOE SURGERY Bilateral 1993   with screws, on screw removed   JOINT REPLACEMENT     KNEE ARTHROSCOPY Bilateral    left x2, right x1   LAPAROSCOPIC CHOLECYSTECTOMY     LEFT HEART CATH AND CORONARY ANGIOGRAPHY N/A 08/11/2017   Procedure: LEFT HEART CATH AND CORONARY ANGIOGRAPHY;  Surgeon: Martinique, Peter M, MD;  Location: [facility] CV LAB;  Service: Cardiovascular;  Laterality: N/A;   LEFT HEART CATHETERIZATION WITH CORONARY ANGIOGRAM N/A 04/07/2012   Procedure: LEFT HEART CATHETERIZATION WITH CORONARY ANGIOGRAM;  Surgeon: [name], MD;  Location: [facility] CATH LAB;  Service: Cardiovascular;  Laterality: N/A;   LEFT HEART CATHETERIZATION WITH CORONARY ANGIOGRAM N/A 05/27/2014   Procedure: LEFT HEART CATHETERIZATION WITH CORONARY ANGIOGRAM;  Surgeon: [name], MD; LAD 99% ISR, CFX 50-60%, RCA (dominant) no sig dz, EF 60%   LOWER EXTREMITY ANGIOGRAM  05/17/11   directional atherectomy to the prox L SFA using a LX Man TurboHawk, ballooned with a [place] balloon    ORIF ANKLE FRACTURE  07/09/2012   Procedure: OPEN REDUCTION  INTERNAL FIXATION (ORIF) ANKLE FRACTURE;  Surgeon: Meredith Pel, MD;  [facility];  Service: Orthopedics;  Laterality: Left;  open reduction internal fixation trimalleolar ankle fracture medial malleolous fixation   PERCUTANEOUS CORONARY STENT INTERVENTION (PCI-S) N/A 04/07/2012   Procedure: PERCUTANEOUS CORONARY STENT INTERVENTION (PCI-S);  Surgeon: [name], MD;  Location: [facility] CATH LAB;  Service: Cardiovascular;  Laterality: N/A;   PERCUTANEOUS CORONARY STENT INTERVENTION (PCI-S)  05/27/2014   Procedure: PERCUTANEOUS CORONARY STENT INTERVENTION (PCI-S);  Surgeon: [name], MD; 3 mm x 12 mm long Xience Xpedition DES to the proximal LAD   SAVORY DILATION N/A 06/13/2015   Procedure: SAVORY DILATION;  Surgeon: Carol Ada, MD;  [facility];  Service: Endoscopy;  Laterality: N/A;   SAVORY DILATION N/A 04/07/2018   Procedure: SAVORY DILATION;  Surgeon: Carol Ada, MD;  [facility];  Service: Endoscopy;  Laterality: N/A;   TEE WITHOUT CARDIOVERSION N/A 08/17/2017   Procedure: TRANSESOPHAGEAL ECHOCARDIOGRAM (TEE);  Surgeon: Modesto Charon  C, MD;  Location: [facility];  Service: Open Heart Surgery;  Laterality: N/A;   TEE WITHOUT CARDIOVERSION N/A 07/16/2021   Procedure: TRANSESOPHAGEAL ECHOCARDIOGRAM (TEE);  Surgeon: Werner Lean, MD;  Location: [facility] ENDOSCOPY;  Service: Cardiovascular;  Laterality: N/A;   TONSILLECTOMY     TOTAL KNEE ARTHROPLASTY Left 2003   TOTAL KNEE REVISION Left 11/05/2013   Procedure: LEFT TOTAL KNEE RESECTION;  Surgeon: Mauri Pole, MD;  [facility];  Service: Orthopedics;  Laterality: Left;   TOTAL KNEE REVISION Left 2007   opened in 2006 and cleaned out, 2007 revision   TOTAL KNEE REVISION Left 12/31/2013   Procedure: RE-INPLANTATION LEFT TOTAL KNEE ;  Surgeon: Mauri Pole, MD;  [facility];  Service: Orthopedics;  Laterality: Left;   VIDEO ASSISTED THORACOSCOPY (VATS)/ LOBECTOMY  01/2009     A IV  Location/Drains/Wounds Patient Lines/Drains/Airways Status     Active Line/Drains/Airways     Name Placement date Placement time Site Days   Peripheral IV 10/01/22 22 G Left;Posterior Hand 10/01/22  1832  Hand  1            Intake/Output Last 24 hours  Intake/Output Summary (Last 24 hours) at 10/02/2022 1436 Last data filed at 10/01/2022 2355 Gross per 24 hour  Intake 99.82 ml  Output 800 ml  Net -700.18 ml    Labs/Imaging Results for orders placed or performed during the hospital encounter of 10/01/22 (from the past 48 hour(s))  Comprehensive metabolic panel     Status: Abnormal   Collection Time: 10/01/22  3:09 PM  Result Value Ref Range   Sodium 132 (L) 135 - 145 mmol/L   Potassium 4.2 3.5 - 5.1 mmol/L   Chloride 96 (L) 98 - 111 mmol/L   CO2 25 22 - 32 mmol/L   Glucose, Bld 266 (H) 70 - 99 mg/dL    Comment: Glucose reference range applies only to samples taken after fasting for at least 8 hours.   BUN 16 8 - 23 mg/dL   Creatinine, Ser 1.19 (H) 0.44 - 1.00 mg/dL   Calcium 9.5 8.9 - 10.3 mg/dL   Total Protein 7.8 6.5 - 8.1 g/dL   Albumin 3.1 (L) 3.5 - 5.0 g/dL   AST 30 15 - 41 U/L   ALT 18 0 - 44 U/L   Alkaline Phosphatase 91 38 - 126 U/L   Total Bilirubin 1.3 (H) 0.3 - 1.2 mg/dL   GFR, Estimated 49 (L) >60 mL/min    Comment: (NOTE) Calculated using the CKD-EPI Creatinine Equation (2021)    Anion gap 11 5 - 15    Comment: Performed at [facility] [address], [place], [place] [address]  CBC with Differential     Status: Abnormal   Collection Time: 10/01/22  3:09 PM  Result Value Ref Range   WBC 13.3 (H) 4.0 - 10.5 K/uL   RBC 4.08 3.87 - 5.11 MIL/uL   Hemoglobin 11.8 (L) 12.0 - 15.0 g/dL   HCT 36.2 36.0 - 46.0 %   MCV 88.7 80.0 - 100.0 fL   MCH 28.9 26.0 - 34.0 pg   MCHC 32.6 30.0 - 36.0 g/dL   RDW 13.2 11.5 - 15.5 %   Platelets 278 150 - 400 K/uL   nRBC 0.0 0.0 - 0.2 %   Neutrophils Relative % 82 %   Neutro Abs 11.1 (H) 1.7 - 7.7 K/uL    Lymphocytes Relative 7 %   Lymphs Abs 0.9 0.7 -  4.0 K/uL   Monocytes Relative 8 %   Monocytes Absolute 1.1 (H) 0.1 - 1.0 K/uL   Eosinophils Relative 1 %   Eosinophils Absolute 0.1 0.0 - 0.5 K/uL   Basophils Relative 1 %   Basophils Absolute 0.1 0.0 - 0.1 K/uL   Immature Granulocytes 1 %   Abs Immature Granulocytes 0.08 (H) 0.00 - 0.07 K/uL    Comment: Performed at [facility] [address], [place], [place] [address]  Protime-INR     Status: Abnormal   Collection Time: 10/01/22  5:56 PM  Result Value Ref Range   Prothrombin Time 39.9 (H) 11.4 - 15.2 seconds   INR 4.2 (HH) 0.8 - 1.2    Comment: CRITICAL RESULT CALLED TO, READ BACK BY AND VERIFIED WITH: REPEATED TO VERIFY SPECIMEN CHECKED FOR CLOTS A. THOMPSON RN 2012 10/01/22 LIN AUNG (NOTE) INR goal varies based on device and disease states. Performed at [facility], [place] [address] [place] [address] [place], [place] [address]   Brain natriuretic peptide     Status: Abnormal   Collection Time: 10/01/22  5:56 PM  Result Value Ref Range   B Natriuretic Peptide 174.2 (H) 0.0 - 100.0 pg/mL    Comment: Performed at [facility] [address], [place], [place] [address]  Urinalysis, Routine w reflex microscopic Urine, Clean Catch     Status: Abnormal   Collection Time: 10/01/22  7:51 PM  Result Value Ref Range   Color, Urine YELLOW YELLOW   APPearance CLEAR CLEAR   Specific Gravity, Urine 1.008 1.005 - 1.030   pH 6.0 5.0 - 8.0   Glucose, UA 150 (A) NEGATIVE mg/dL   Hgb urine dipstick SMALL (A) NEGATIVE   Bilirubin Urine NEGATIVE NEGATIVE   Ketones, ur 5 (A) NEGATIVE mg/dL   Protein, ur NEGATIVE NEGATIVE mg/dL   Nitrite NEGATIVE NEGATIVE   Leukocytes,Ua NEGATIVE NEGATIVE   RBC / HPF 0-5 0 - 5 RBC/hpf   WBC, UA 0-5 0 - 5 WBC/hpf   Bacteria, UA NONE SEEN NONE SEEN   Squamous Epithelial / HPF 0-5 0 - 5 /HPF    Comment: Performed at [facility], [place] [address], [place], [place] [address]  CBG monitoring, ED      Status: Abnormal   Collection Time: 10/01/22 11:47 PM  Result Value Ref Range   Glucose-Capillary 249 (H) 70 - 99 mg/dL    Comment: Glucose reference range applies only to samples taken after fasting for at least 8 hours.  C-reactive protein     Status: Abnormal   Collection Time: 10/01/22 11:48 PM  Result Value Ref Range   CRP 12.5 (H) <1.0 mg/dL    Comment: Performed at [facility], [place] [address], [place], [place] [address]  Procalcitonin - Baseline     Status: None   Collection Time: 10/01/22 11:48 PM  Result Value Ref Range   Procalcitonin 0.13 ng/mL    Comment:        Interpretation: PCT (Procalcitonin) <= 0.5 ng/mL: Systemic infection (sepsis) is not likely. Local bacterial infection is possible. (NOTE)       Sepsis PCT Algorithm           Lower Respiratory Tract                                      Infection PCT Algorithm    ----------------------------     ----------------------------  PCT < 0.25 ng/mL                PCT < 0.10 ng/mL          Strongly encourage             Strongly discourage   discontinuation of antibiotics    initiation of antibiotics    ----------------------------     -----------------------------       PCT 0.25 - 0.50 ng/mL            PCT 0.10 - 0.25 ng/mL               OR       >80% decrease in PCT            Discourage initiation of                                            antibiotics      Encourage discontinuation           of antibiotics    ----------------------------     -----------------------------         PCT >= 0.50 ng/mL              PCT 0.26 - 0.50 ng/mL               AND        <80% decrease in PCT             Encourage initiation of                                             antibiotics       Encourage continuation           of antibiotics    ----------------------------     -----------------------------        PCT >= 0.50 ng/mL                  PCT > 0.50 ng/mL               AND         increase in PCT                   Strongly encourage                                      initiation of antibiotics    Strongly encourage escalation           of antibiotics                                     -----------------------------                                           PCT <= 0.25 ng/mL  OR                                        > 80% decrease in PCT                                      Discontinue / Do not initiate                                             antibiotics  Performed at [facility], [place] [address] [place], [place] [address]   CBC     Status: Abnormal   Collection Time: 10/02/22  5:12 AM  Result Value Ref Range   WBC 10.6 (H) 4.0 - 10.5 K/uL   RBC 3.50 (L) 3.87 - 5.11 MIL/uL   Hemoglobin 10.0 (L) 12.0 - 15.0 g/dL   HCT 30.7 (L) 36.0 - 46.0 %   MCV 87.7 80.0 - 100.0 fL   MCH 28.6 26.0 - 34.0 pg   MCHC 32.6 30.0 - 36.0 g/dL   RDW 13.1 11.5 - 15.5 %   Platelets 270 150 - 400 K/uL   nRBC 0.0 0.0 - 0.2 %    Comment: Performed at [facility], [place] [address] [place], [place] [address]  Protime-INR     Status: Abnormal   Collection Time: 10/02/22  5:12 AM  Result Value Ref Range   Prothrombin Time 42.8 (H) 11.4 - 15.2 seconds   INR 4.6 (HH) 0.8 - 1.2    Comment: REPEATED TO VERIFY CRITICAL RESULT CALLED TO, READ BACK BY AND VERIFIED WITH: JAMIE BLUE RN 10/02/2022 0719 BTAYLOR (NOTE) INR goal varies based on device and disease states. Performed at [facility], [place] [address], [place], [place] [address]   CBG monitoring, ED     Status: Abnormal   Collection Time: 10/02/22 10:05 AM  Result Value Ref Range   Glucose-Capillary 175 (H) 70 - 99 mg/dL    Comment: Glucose reference range applies only to samples taken after fasting for at least 8 hours.  CBG monitoring, ED     Status: Abnormal   Collection Time: 10/02/22 11:53 AM  Result Value Ref Range   Glucose-Capillary 189 (H) 70 - 99 mg/dL    Comment: Glucose  reference range applies only to samples taken after fasting for at least 8 hours.   DG Ribs Unilateral W/Chest Left  Result Date: 10/01/2022 CLINICAL DATA:  pain EXAM: LEFT RIBS AND CHEST - 3+ VIEW COMPARISON:  Chest x-ray July 14, 2021. FINDINGS: Displaced fracture of the left sixth rib posteriorly. No obvious pneumothorax on these limited supine radiographs. No definite consolidation. Polyarticular degenerative change. Cardiomediastinal silhouette is enlarged, similar prior. Median sternotomy. IMPRESSION: Displaced fracture of the left sixth rib posteriorly. No obvious pneumothorax on these limited supine radiographs. Electronically Signed   By: Margaretha Sheffield M.D.   On: 10/01/2022 16:57   DG Hip Unilat With Pelvis 2-3 Views Left  Result Date: 10/01/2022 CLINICAL DATA:  Fall.  Pain. EXAM: DG HIP (WITH OR WITHOUT PELVIS) 2-3V LEFT; DG HIP (WITH OR WITHOUT PELVIS) 2-3V RIGHT COMPARISON:  Right hip radiographs 09/01/2022 FINDINGS: There is diffuse decreased bone mineralization. Mild bilateral  sacroiliac subchondral sclerosis. The bilateral femoroacetabular joint spaces are maintained. No acute fracture is seen. No dislocation. IMPRESSION: 1. No acute fracture. 2. Mild bilateral sacroiliac osteoarthritis. Electronically Signed   By: Yvonne Kendall M.D.   On: 10/01/2022 16:57   DG Hip Unilat With Pelvis 2-3 Views Right  Result Date: 10/01/2022 CLINICAL DATA:  Fall.  Pain. EXAM: DG HIP (WITH OR WITHOUT PELVIS) 2-3V LEFT; DG HIP (WITH OR WITHOUT PELVIS) 2-3V RIGHT COMPARISON:  Right hip radiographs 09/01/2022 FINDINGS: There is diffuse decreased bone mineralization. Mild bilateral sacroiliac subchondral sclerosis. The bilateral femoroacetabular joint spaces are maintained. No acute fracture is seen. No dislocation. IMPRESSION: 1. No acute fracture. 2. Mild bilateral sacroiliac osteoarthritis. Electronically Signed   By: Yvonne Kendall M.D.   On: 10/01/2022 16:57   DG Shoulder Right  Result Date:  10/01/2022 CLINICAL DATA:  Fall with shoulder pain EXAM: RIGHT SHOULDER - 3 VIEW COMPARISON:  Right shoulder radiographs dated 04/22/2008, CT chest dated 07/06/2022 FINDINGS: There is no evidence of fracture or dislocation. Mild degenerative changes of the glenohumeral and acromioclavicular joints. Soft tissues are unremarkable. Linear calcific density projecting inferomedial to the glenoid likely reflects atherosclerotic calcification in this region. Partially imaged calcifications in the right breast. IMPRESSION: 1. No acute fracture or dislocation. 2. Mild degenerative changes of the glenohumeral and acromioclavicular joints. Electronically Signed   By: Darrin Nipper M.D.   On: 10/01/2022 16:56   DG Shoulder Left  Result Date: 10/01/2022 CLINICAL DATA:  Pain, fall. EXAM: LEFT SHOULDER - 2+ VIEW COMPARISON:  None Available. FINDINGS: There is no evidence of fracture or dislocation. Soft tissue calcifications adjacent to the greater tuberosity are likely related to calcific tendinitis. There is mild degenerative narrowing of the joint spaces. IMPRESSION: 1. No fracture or dislocation. Electronically Signed   By: Ronney Asters M.D.   On: 10/01/2022 16:53   CT Cervical Spine Wo Contrast  Result Date: 10/01/2022 CLINICAL DATA:  [place], right knee pain EXAM: CT CERVICAL SPINE WITHOUT CONTRAST TECHNIQUE: Multidetector CT imaging of the cervical spine was performed without intravenous contrast. Multiplanar CT image reconstructions were also generated. RADIATION DOSE REDUCTION: This exam was performed according to the departmental dose-optimization program which includes automated exposure control, adjustment of the mA and/or kV according to patient size and/or use of iterative reconstruction technique. COMPARISON:  None Available. FINDINGS: Alignment: Alignment is grossly anatomic. Skull base and vertebrae: No acute fracture. No primary bone lesion or focal pathologic process. Soft tissues and spinal canal: No  prevertebral fluid or swelling. No visible canal hematoma. Disc levels:  Mild multilevel spondylosis greatest at C5-6. Upper chest: Airway is patent. Visualized portions of the lung apices are clear. Other: Reconstructed images demonstrate no additional findings. IMPRESSION: 1. No acute cervical spine fracture. Electronically Signed   By: Randa Ngo M.D.   On: 10/01/2022 16:10   CT Head Wo Contrast  Result Date: 10/01/2022 CLINICAL DATA:  [place] 2 days ago, anticoagulated EXAM: CT HEAD WITHOUT CONTRAST TECHNIQUE: Contiguous axial images were obtained from the base of the skull through the vertex without intravenous contrast. RADIATION DOSE REDUCTION: This exam was performed according to the departmental dose-optimization program which includes automated exposure control, adjustment of the mA and/or kV according to patient size and/or use of iterative reconstruction technique. COMPARISON:  01/31/2012 FINDINGS: Brain: No acute infarct or hemorrhage. Lateral ventricles and midline structures are unremarkable. No acute extra-axial fluid collections. No mass effect. Vascular: No hyperdense vessel or unexpected calcification. Skull: Normal. Negative for fracture or  focal lesion. Sinuses/Orbits: No acute finding. Other: None. IMPRESSION: 1. No acute intracranial process. Electronically Signed   By: Randa Ngo M.D.   On: 10/01/2022 16:08    Pending Labs Unresulted Labs (From admission, onward)     Start     Ordered   10/02/22 0500  Comprehensive metabolic panel  Tomorrow morning,   R        10/01/22 2138   10/02/22 0500  Protime-INR  Daily,   R      10/01/22 2156   10/01/22 2201  Culture, blood (Routine X 2) w Reflex to ID Panel  BLOOD CULTURE X 2,   R (with TIMED occurrences)     Comments: for severe disease only    10/01/22 2204   10/01/22 2157  MRSA Next Gen by PCR, Nasal  Once,   R        10/01/22 2204   10/01/22 2138  Hemoglobin A1c  Once,   R        10/01/22 2138   10/01/22 2137  Urine  Culture  (Urine Culture)  Once,   R       Question:  Indication  Answer:  Acute gross hematuria   10/01/22 2138   10/01/22 2135  Hemoglobin A1c  (Glycemic Control (SSI)  Q 4 Hours / Glycemic Control (SSI)  AC +/- HS)  Once,   R       Comments: To assess prior glycemic control    10/01/22 2138            Vitals/Pain Today's Vitals   10/02/22 1015 10/02/22 1058 10/02/22 1059 10/02/22 1240  BP: (!) 113/59     Pulse: (!) 120     Resp: 20     Temp:  98.8 F (37.1 C)    TempSrc:  Oral    SpO2: 99%     PainSc:   10-Worst pain ever 10-Worst pain ever    Isolation Precautions No active isolations  Medications Medications  furosemide (LASIX) injection 40 mg (40 mg Intravenous Given 10/02/22 0948)  traMADol (ULTRAM) tablet 50 mg (50 mg Oral Given 10/02/22 1100)  furosemide (LASIX) tablet 40 mg (40 mg Oral Not Given 10/01/22 2303)  metoprolol succinate (TOPROL-XL) 24 hr tablet 50 mg (50 mg Oral Given 10/02/22 0947)  metoprolol tartrate (LOPRESSOR) tablet 25 mg (has no administration in time range)  simvastatin (ZOCOR) tablet 40 mg (has no administration in time range)  spironolactone (ALDACTONE) tablet 25 mg (has no administration in time range)  insulin aspart protamine- aspart (NOVOLOG MIX 70/30) injection 20 Units (20 Units Subcutaneous Given 10/01/22 2347)  insulin aspart protamine- aspart (NOVOLOG MIX 70/30) injection 62 Units (62 Units Subcutaneous Given 10/02/22 1239)  pantoprazole (PROTONIX) EC tablet 40 mg (40 mg Oral Given 10/01/22 2313)  cholecalciferol (VITAMIN D3) 25 MCG (1000 UNIT) tablet 2,000 Units (2,000 Units Oral Given 10/02/22 0948)  insulin aspart (novoLOG) injection 0-15 Units (3 Units Subcutaneous Given 10/02/22 1238)  acetaminophen (TYLENOL) tablet 650 mg (650 mg Oral Given 10/02/22 1101)    Or  acetaminophen (TYLENOL) suppository 650 mg ( Rectal See Alternative 10/02/22 1101)  ondansetron (ZOFRAN) tablet 4 mg (has no administration in time range)    Or  ondansetron (ZOFRAN)  injection 4 mg (has no administration in time range)  albuterol (PROVENTIL) (2.5 MG/3ML) 0.083% nebulizer solution 2.5 mg (has no administration in time range)  lisinopril (ZESTRIL) tablet 20 mg (20 mg Oral Given 10/02/22 0949)    And  hydrochlorothiazide (HYDRODIURIL) tablet  25 mg (25 mg Oral Given 10/02/22 0948)  Warfarin - Pharmacist Dosing Inpatient (has no administration in time range)  cefTRIAXone (ROCEPHIN) 1 g in sodium chloride 0.9 % 100 mL IVPB (0 g Intravenous Stopped 10/01/22 2355)  HYDROcodone-acetaminophen (NORCO/VICODIN) 5-325 MG per tablet 1 tablet (has no administration in time range)  HYDROcodone-acetaminophen (NORCO/VICODIN) 5-325 MG per tablet 1 tablet (1 tablet Oral Given 10/01/22 1647)  HYDROcodone-acetaminophen (NORCO/VICODIN) 5-325 MG per tablet 1 tablet (1 tablet Oral Given 10/02/22 0248)    Mobility non-ambulatory Low fall risk   Focused Assessments     R Recommendations: See Admitting Provider Note  Report given to:   Additional Notes:

## 2022-10-02 NOTE — Progress Notes (Signed)
Due to her AKI and obesity, ok to change vanc to PO linezolid for wound infection per Dr. Marthenia Rolling.  Onnie Boer, PharmD, BCIDP, AAHIVP, CPP Infectious Disease Pharmacist 10/02/2022 5:52 PM

## 2022-10-02 NOTE — Progress Notes (Signed)
PROGRESS NOTE    [name]  GLO:[id number] DOB: 10/21/1950 DOA: 10/01/2022 PCP: [name], MD  Outpatient Specialists:     Brief Narrative:  Patient is a 72 year old African-American female past medical history significant for lung ca s/p surgery,hx of  CAD s/p stent LAD, HLD,Paroxysmal atrial fibrillation on warfarin ,  OSA does not wear cpap, CHFpef, PAD with chronic venostasis with b/l lower extremity ulcerations on calves followed by vascular and wound center, type 2 diabetes on insulin,  severe djd of b/l knees s/p TKR on Left,and morbid obesity who has had progressive debility on chronic debility with recurrent falls at home leading to multiple ED visits for falls.  Patient presents with recurrent falls, failure to thrive, with significant edema of bilateral lower extremities, worse on the right side.  Right lower leg is also warm to touch with some skin changes.  Will increase IV Rocephin to 2 g once daily.  Will add vancomycin.  Will get vascular Doppler ultrasound of lower extremities.   Assessment & Plan:   Principal Problem:   CHF (congestive heart failure) (HCC)  Recurrent Fall due chronic progressive debility due to DJD b/l hips/knees -per patient not able to take care of self at home and family not able to provide 24 hour care -unsafe d/c due to falls  -PT to assess rehab needs      Chronic venostasis wounds with concern for possible associated wound infection /cellulitis  -patient with noted drainage on presentation. -Continue IV Rocephin but 2 g daily. -Start IV vancomycin. -monitor wbc count -wound care to see  -Continue diuretics.   -Vascular Doppler ultrasound of lower extremities.   Displaced fracture of the left sixth rib posteriorly. -asymptomatic no complaint of pain appears subacute    CHFpef  -Compensated.     OSA -not compliant with cpap  -monitor pulse ox  -O2 qhs    Hx lung ca s/p surgery\ -in remission    CAD s/p stent  LAD -no active issues  -resume metoprolol, statin      HLD -continue statin      Paroxysmal atrial fibrillation - on warfarin  -has supratherapeutic inr at 4.6 (likely due to effect of antibiotics).   PAD with chronic venostasis - hx lower extremity ulcerations on calves  -followed by vascular and wound center -wound care to see while in house    Type 2 diabetes  -on insulin, resume home regimen 70/30 bid  -iss/fs     Severe djd of b/l knees/hip -PT/OT -resume home pain regimen     Morbid obesity -complicates prognosis     DVT prophylaxis: Warfarin. Code Status: Full code. Family Communication:  Disposition Plan: This will depend on hospital course.   Consultants:  None.  Procedures:  None.  Antimicrobials:  IV vancomycin. IV Rocephin.   Subjective: -No fever or chills. -Has bilateral lower extremity. -Skin changes right lower extremity.  Objective: Vitals:   10/02/22 1455 10/02/22 1502 10/02/22 1515 10/02/22 1530  BP: (!) 100/51 (!) 105/47 (!) 101/50 (!) 109/49  Pulse: (!) 118  (!) 110 (!) 114  Resp: 19  (!) 24 (!) 24  Temp: 98.7 F (37.1 C)     TempSrc:      SpO2: 100%  100% 98%    Intake/Output Summary (Last 24 hours) at 10/02/2022 1550 Last data filed at 10/02/2022 1535 Gross per 24 hour  Intake 99.82 ml  Output 1300 ml  Net -1200.18 ml   There were no vitals filed for this  visit.  Examination:  General exam: Appears calm and comfortable.  Patient is morbidly obese.  Patient is pale. Respiratory system: Clear to auscultation.  Cardiovascular system: S1 & S2 heard Gastrointestinal system: Abdomen is morbidly obese, soft and nontender.  Central nervous system: Alert and oriented.  Patient moves all extremities.   Extremities: Bilateral lower extremity edema, significantly worse on the right side.   Data Reviewed: I have personally reviewed following labs and imaging studies  CBC: Recent Labs  Lab 09/29/22 1751 10/01/22 1509  10/02/22 0512  WBC 11.3* 13.3* 10.6*  NEUTROABS 9.2* 11.1*  --   HGB 11.3* 11.8* 10.0*  HCT 35.1* 36.2 30.7*  MCV 88.9 88.7 87.7  PLT 250 278 154   Basic Metabolic Panel: Recent Labs  Lab 09/29/22 1751 10/01/22 1509  NA 132* 132*  K 4.5 4.2  CL 96* 96*  CO2 27 25  GLUCOSE 261* 266*  BUN 29* 16  CREATININE 1.37* 1.19*  CALCIUM 9.5 9.5   GFR: Estimated Creatinine Clearance: 56.8 mL/min (A) (by C-G formula based on SCr of 1.19 mg/dL (H)). Liver Function Tests: Recent Labs  Lab 10/01/22 1509  AST 30  ALT 18  ALKPHOS 91  BILITOT 1.3*  PROT 7.8  ALBUMIN 3.1*   No results for input(s): "LIPASE", "AMYLASE" in the last 168 hours. No results for input(s): "AMMONIA" in the last 168 hours. Coagulation Profile: Recent Labs  Lab 10/01/22 1756 10/02/22 0512  INR 4.2* 4.6*   Cardiac Enzymes: No results for input(s): "CKTOTAL", "CKMB", "CKMBINDEX", "TROPONINI" in the last 168 hours. BNP (last 3 results) No results for input(s): "PROBNP" in the last 8760 hours. HbA1C: No results for input(s): "HGBA1C" in the last 72 hours. CBG: Recent Labs  Lab 10/01/22 2347 10/02/22 1005 10/02/22 1153  GLUCAP 249* 175* 189*   Lipid Profile: No results for input(s): "CHOL", "HDL", "LDLCALC", "TRIG", "CHOLHDL", "LDLDIRECT" in the last 72 hours. Thyroid Function Tests: No results for input(s): "TSH", "T4TOTAL", "FREET4", "T3FREE", "THYROIDAB" in the last 72 hours. Anemia Panel: No results for input(s): "VITAMINB12", "FOLATE", "FERRITIN", "TIBC", "IRON", "RETICCTPCT" in the last 72 hours. Urine analysis:    Component Value Date/Time   COLORURINE YELLOW 10/01/2022 1951   APPEARANCEUR CLEAR 10/01/2022 1951   LABSPEC 1.008 10/01/2022 1951   PHURINE 6.0 10/01/2022 1951   GLUCOSEU 150 (A) 10/01/2022 1951   HGBUR SMALL (A) 10/01/2022 [place] NEGATIVE 10/01/2022 1951   KETONESUR 5 (A) 10/01/2022 1951   PROTEINUR NEGATIVE 10/01/2022 1951   UROBILINOGEN 0.2 12/21/2013 1129    NITRITE NEGATIVE 10/01/2022 1951   LEUKOCYTESUR NEGATIVE 10/01/2022 1951   Sepsis Labs: @LABRCNTIP (procalcitonin:4,lacticidven:4)  ) Recent Results (from the past 240 hour(s))  Resp panel by RT-PCR (RSV, Flu A&B, Covid) Anterior Nasal Swab     Status: None   Collection Time: 09/29/22  8:20 PM   Specimen: Anterior Nasal Swab  Result Value Ref Range Status   SARS Coronavirus 2 by RT PCR NEGATIVE NEGATIVE Final    Comment: (NOTE) SARS-CoV-2 target nucleic acids are NOT DETECTED.  The SARS-CoV-2 RNA is generally detectable in upper respiratory specimens during the acute phase of infection. The lowest concentration of SARS-CoV-2 viral copies this assay can detect is 138 copies/mL. A negative result does not preclude SARS-Cov-2 infection and should not be used as the sole basis for treatment or other patient management decisions. A negative result may occur with  improper specimen collection/handling, submission of specimen other than nasopharyngeal swab, presence of viral mutation(s) within the areas  targeted by this assay, and inadequate number of viral copies(<138 copies/mL). A negative result must be combined with clinical observations, patient history, and epidemiological information. The expected result is Negative.  Fact Sheet for Patients:  EntrepreneurPulse.com.au  Fact Sheet for Healthcare Providers:  IncredibleEmployment.be  This test is no t yet approved or cleared by the [place] FDA and  has been authorized for detection and/or diagnosis of SARS-CoV-2 by FDA under an Emergency Use Authorization (EUA). This EUA will remain  in effect (meaning this test can be used) for the duration of the COVID-19 declaration under Section 564(b)(1) of the Act, 21 U.S.C.section 360bbb-3(b)(1), unless the authorization is terminated  or revoked sooner.       Influenza A by PCR NEGATIVE NEGATIVE Final   Influenza B by PCR NEGATIVE NEGATIVE  Final    Comment: (NOTE) The Xpert Xpress SARS-CoV-2/FLU/RSV plus assay is intended as an aid in the diagnosis of influenza from Nasopharyngeal swab specimens and should not be used as a sole basis for treatment. Nasal washings and aspirates are unacceptable for Xpert Xpress SARS-CoV-2/FLU/RSV testing.  Fact Sheet for Patients: EntrepreneurPulse.com.au  Fact Sheet for Healthcare Providers: IncredibleEmployment.be  This test is not yet approved or cleared by the [place] FDA and has been authorized for detection and/or diagnosis of SARS-CoV-2 by FDA under an Emergency Use Authorization (EUA). This EUA will remain in effect (meaning this test can be used) for the duration of the COVID-19 declaration under Section 564(b)(1) of the Act, 21 U.S.C. section 360bbb-3(b)(1), unless the authorization is terminated or revoked.     Resp Syncytial Virus by PCR NEGATIVE NEGATIVE Final    Comment: (NOTE) Fact Sheet for Patients: EntrepreneurPulse.com.au  Fact Sheet for Healthcare Providers: IncredibleEmployment.be  This test is not yet approved or cleared by the [place] FDA and has been authorized for detection and/or diagnosis of SARS-CoV-2 by FDA under an Emergency Use Authorization (EUA). This EUA will remain in effect (meaning this test can be used) for the duration of the COVID-19 declaration under Section 564(b)(1) of the Act, 21 U.S.C. section 360bbb-3(b)(1), unless the authorization is terminated or revoked.  Performed at [facility], [place] [address], [place], [place] [address]          Radiology Studies: DG Ribs Unilateral W/Chest Left  Result Date: 10/01/2022 CLINICAL DATA:  pain EXAM: LEFT RIBS AND CHEST - 3+ VIEW COMPARISON:  Chest x-ray July 14, 2021. FINDINGS: Displaced fracture of the left sixth rib posteriorly. No obvious pneumothorax on these limited supine  radiographs. No definite consolidation. Polyarticular degenerative change. Cardiomediastinal silhouette is enlarged, similar prior. Median sternotomy. IMPRESSION: Displaced fracture of the left sixth rib posteriorly. No obvious pneumothorax on these limited supine radiographs. Electronically Signed   By: Margaretha Sheffield M.D.   On: 10/01/2022 16:57   DG Hip Unilat With Pelvis 2-3 Views Left  Result Date: 10/01/2022 CLINICAL DATA:  Fall.  Pain. EXAM: DG HIP (WITH OR WITHOUT PELVIS) 2-3V LEFT; DG HIP (WITH OR WITHOUT PELVIS) 2-3V RIGHT COMPARISON:  Right hip radiographs 09/01/2022 FINDINGS: There is diffuse decreased bone mineralization. Mild bilateral sacroiliac subchondral sclerosis. The bilateral femoroacetabular joint spaces are maintained. No acute fracture is seen. No dislocation. IMPRESSION: 1. No acute fracture. 2. Mild bilateral sacroiliac osteoarthritis. Electronically Signed   By: Yvonne Kendall M.D.   On: 10/01/2022 16:57   DG Hip Unilat With Pelvis 2-3 Views Right  Result Date: 10/01/2022 CLINICAL DATA:  Fall.  Pain. EXAM: DG HIP (WITH OR WITHOUT  PELVIS) 2-3V LEFT; DG HIP (WITH OR WITHOUT PELVIS) 2-3V RIGHT COMPARISON:  Right hip radiographs 09/01/2022 FINDINGS: There is diffuse decreased bone mineralization. Mild bilateral sacroiliac subchondral sclerosis. The bilateral femoroacetabular joint spaces are maintained. No acute fracture is seen. No dislocation. IMPRESSION: 1. No acute fracture. 2. Mild bilateral sacroiliac osteoarthritis. Electronically Signed   By: Yvonne Kendall M.D.   On: 10/01/2022 16:57   DG Shoulder Right  Result Date: 10/01/2022 CLINICAL DATA:  Fall with shoulder pain EXAM: RIGHT SHOULDER - 3 VIEW COMPARISON:  Right shoulder radiographs dated 04/22/2008, CT chest dated 07/06/2022 FINDINGS: There is no evidence of fracture or dislocation. Mild degenerative changes of the glenohumeral and acromioclavicular joints. Soft tissues are unremarkable. Linear calcific density projecting  inferomedial to the glenoid likely reflects atherosclerotic calcification in this region. Partially imaged calcifications in the right breast. IMPRESSION: 1. No acute fracture or dislocation. 2. Mild degenerative changes of the glenohumeral and acromioclavicular joints. Electronically Signed   By: Darrin Nipper M.D.   On: 10/01/2022 16:56   DG Shoulder Left  Result Date: 10/01/2022 CLINICAL DATA:  Pain, fall. EXAM: LEFT SHOULDER - 2+ VIEW COMPARISON:  None Available. FINDINGS: There is no evidence of fracture or dislocation. Soft tissue calcifications adjacent to the greater tuberosity are likely related to calcific tendinitis. There is mild degenerative narrowing of the joint spaces. IMPRESSION: 1. No fracture or dislocation. Electronically Signed   By: Ronney Asters M.D.   On: 10/01/2022 16:53   CT Cervical Spine Wo Contrast  Result Date: 10/01/2022 CLINICAL DATA:  [place], right knee pain EXAM: CT CERVICAL SPINE WITHOUT CONTRAST TECHNIQUE: Multidetector CT imaging of the cervical spine was performed without intravenous contrast. Multiplanar CT image reconstructions were also generated. RADIATION DOSE REDUCTION: This exam was performed according to the departmental dose-optimization program which includes automated exposure control, adjustment of the mA and/or kV according to patient size and/or use of iterative reconstruction technique. COMPARISON:  None Available. FINDINGS: Alignment: Alignment is grossly anatomic. Skull base and vertebrae: No acute fracture. No primary bone lesion or focal pathologic process. Soft tissues and spinal canal: No prevertebral fluid or swelling. No visible canal hematoma. Disc levels:  Mild multilevel spondylosis greatest at C5-6. Upper chest: Airway is patent. Visualized portions of the lung apices are clear. Other: Reconstructed images demonstrate no additional findings. IMPRESSION: 1. No acute cervical spine fracture. Electronically Signed   By: Randa Ngo M.D.   On:  10/01/2022 16:10   CT Head Wo Contrast  Result Date: 10/01/2022 CLINICAL DATA:  [place] 2 days ago, anticoagulated EXAM: CT HEAD WITHOUT CONTRAST TECHNIQUE: Contiguous axial images were obtained from the base of the skull through the vertex without intravenous contrast. RADIATION DOSE REDUCTION: This exam was performed according to the departmental dose-optimization program which includes automated exposure control, adjustment of the mA and/or kV according to patient size and/or use of iterative reconstruction technique. COMPARISON:  01/31/2012 FINDINGS: Brain: No acute infarct or hemorrhage. Lateral ventricles and midline structures are unremarkable. No acute extra-axial fluid collections. No mass effect. Vascular: No hyperdense vessel or unexpected calcification. Skull: Normal. Negative for fracture or focal lesion. Sinuses/Orbits: No acute finding. Other: None. IMPRESSION: 1. No acute intracranial process. Electronically Signed   By: Randa Ngo M.D.   On: 10/01/2022 16:08        Scheduled Meds:  cholecalciferol  2,000 Units Oral Daily   furosemide  40 mg Intravenous BID   furosemide  40 mg Oral Q M,W,F   lisinopril  20 mg  Oral Daily   And   hydrochlorothiazide  25 mg Oral Daily   insulin aspart  0-15 Units Subcutaneous TID WC   insulin aspart protamine- aspart  20 Units Subcutaneous QHS   insulin aspart protamine- aspart  62 Units Subcutaneous Q breakfast   metoprolol succinate  50 mg Oral Daily   pantoprazole  40 mg Oral QHS   simvastatin  40 mg Oral q1800   [START ON 10/04/2022] spironolactone  25 mg Oral Q M,W,F   Warfarin - Pharmacist Dosing Inpatient   Does not apply q1600   Continuous Infusions:  cefTRIAXone (ROCEPHIN)  IV Stopped (10/01/22 2355)     LOS: 1 day    Time spent: 55 minutes.    Dana Allan, MD  [facility] Pager #: [phone] 7PM-7AM contact night coverage as above

## 2022-10-03 ENCOUNTER — Inpatient Hospital Stay (HOSPITAL_COMMUNITY): Payer: HMO

## 2022-10-03 DIAGNOSIS — R609 Edema, unspecified: Secondary | ICD-10-CM | POA: Diagnosis not present

## 2022-10-03 DIAGNOSIS — R52 Pain, unspecified: Secondary | ICD-10-CM

## 2022-10-03 LAB — PROTIME-INR
INR: 3.2 — ABNORMAL HIGH (ref 0.8–1.2)
Prothrombin Time: 32.6 seconds — ABNORMAL HIGH (ref 11.4–15.2)

## 2022-10-03 LAB — CBC WITH DIFFERENTIAL/PLATELET
Abs Immature Granulocytes: 0.1 10*3/uL — ABNORMAL HIGH (ref 0.00–0.07)
Basophils Absolute: 0.1 10*3/uL (ref 0.0–0.1)
Basophils Relative: 1 %
Eosinophils Absolute: 0.3 10*3/uL (ref 0.0–0.5)
Eosinophils Relative: 3 %
HCT: 31.9 % — ABNORMAL LOW (ref 36.0–46.0)
Hemoglobin: 10.6 g/dL — ABNORMAL LOW (ref 12.0–15.0)
Immature Granulocytes: 1 %
Lymphocytes Relative: 17 %
Lymphs Abs: 1.9 10*3/uL (ref 0.7–4.0)
MCH: 29.3 pg (ref 26.0–34.0)
MCHC: 33.2 g/dL (ref 30.0–36.0)
MCV: 88.1 fL (ref 80.0–100.0)
Monocytes Absolute: 1 10*3/uL (ref 0.1–1.0)
Monocytes Relative: 9 %
Neutro Abs: 7.8 10*3/uL — ABNORMAL HIGH (ref 1.7–7.7)
Neutrophils Relative %: 69 %
Platelets: 259 10*3/uL (ref 150–400)
RBC: 3.62 MIL/uL — ABNORMAL LOW (ref 3.87–5.11)
RDW: 13.2 % (ref 11.5–15.5)
WBC: 11.1 10*3/uL — ABNORMAL HIGH (ref 4.0–10.5)
nRBC: 0 % (ref 0.0–0.2)

## 2022-10-03 LAB — URINE CULTURE

## 2022-10-03 LAB — RENAL FUNCTION PANEL
Albumin: 2.6 g/dL — ABNORMAL LOW (ref 3.5–5.0)
Anion gap: 13 (ref 5–15)
BUN: 19 mg/dL (ref 8–23)
CO2: 27 mmol/L (ref 22–32)
Calcium: 9.1 mg/dL (ref 8.9–10.3)
Chloride: 94 mmol/L — ABNORMAL LOW (ref 98–111)
Creatinine, Ser: 1.5 mg/dL — ABNORMAL HIGH (ref 0.44–1.00)
GFR, Estimated: 37 mL/min — ABNORMAL LOW (ref >60)
Glucose, Bld: 112 mg/dL — ABNORMAL HIGH (ref 70–99)
Phosphorus: 3.2 mg/dL (ref 2.5–4.6)
Potassium: 3.7 mmol/L (ref 3.5–5.1)
Sodium: 134 mmol/L — ABNORMAL LOW (ref 135–145)

## 2022-10-03 LAB — GLUCOSE, CAPILLARY
Glucose-Capillary: 66 mg/dL — ABNORMAL LOW (ref 70–99)
Glucose-Capillary: 115 mg/dL — ABNORMAL HIGH (ref 70–99)
Glucose-Capillary: 50 mg/dL — ABNORMAL LOW (ref 70–99)
Glucose-Capillary: 49 mg/dL — ABNORMAL LOW (ref 70–99)
Glucose-Capillary: 89 mg/dL (ref 70–99)
Glucose-Capillary: 74 mg/dL (ref 70–99)
Glucose-Capillary: 100 mg/dL — ABNORMAL HIGH (ref 70–99)
Glucose-Capillary: 108 mg/dL — ABNORMAL HIGH (ref 70–99)
Glucose-Capillary: 98 mg/dL (ref 70–99)
Glucose-Capillary: 138 mg/dL — ABNORMAL HIGH (ref 70–99)

## 2022-10-03 LAB — MRSA NEXT GEN BY PCR, NASAL: MRSA by PCR Next Gen: NOT DETECTED

## 2022-10-03 LAB — MAGNESIUM: Magnesium: 1.6 mg/dL — ABNORMAL LOW (ref 1.7–2.4)

## 2022-10-03 MED ORDER — GABAPENTIN 100 MG PO CAPS
100.0000 mg | ORAL_CAPSULE | Freq: Three times a day (TID) | ORAL | Status: DC
  Administered 2022-10-03 – 2022-10-08 (×15): 100 mg via ORAL
  Filled 2022-10-03 (×16): qty 1

## 2022-10-03 MED ORDER — ALBUMIN HUMAN 25 % IV SOLN
12.5000 g | Freq: Every day | INTRAVENOUS | Status: AC
  Administered 2022-10-03 – 2022-10-06 (×4): 12.5 g via INTRAVENOUS
  Filled 2022-10-03 (×4): qty 50

## 2022-10-03 MED ORDER — WARFARIN SODIUM 2 MG PO TABS
2.0000 mg | ORAL_TABLET | Freq: Once | ORAL | Status: AC
  Administered 2022-10-03: 2 mg via ORAL
  Filled 2022-10-03: qty 1

## 2022-10-03 MED ORDER — HYDROCERIN EX CREA
TOPICAL_CREAM | Freq: Every day | CUTANEOUS | Status: DC
  Administered 2022-10-04: 1 via TOPICAL
  Filled 2022-10-03 (×2): qty 113

## 2022-10-03 MED ORDER — OXYCODONE-ACETAMINOPHEN 5-325 MG PO TABS
1.0000 | ORAL_TABLET | Freq: Four times a day (QID) | ORAL | Status: DC | PRN
  Administered 2022-10-03 – 2022-10-06 (×9): 1 via ORAL
  Filled 2022-10-03 (×9): qty 1

## 2022-10-03 MED ORDER — MAGNESIUM SULFATE 2 GM/50ML IV SOLN
2.0000 g | Freq: Once | INTRAVENOUS | Status: AC
  Administered 2022-10-03: 2 g via INTRAVENOUS
  Filled 2022-10-03: qty 50

## 2022-10-03 NOTE — Progress Notes (Addendum)
PT had hypoglycemia this morning, POC was rechecked and PT ate a full breakfast. Given scheduled 70/30 insuline, verified by RN Rebekka, after meal was complete. POC checked for lunch, again hypoglycemic, not symptomatic. Dr. Marthenia Rolling notified via secure chat. PT was given juice.   1150, Provider d/c'd 70/30 order.

## 2022-10-03 NOTE — Progress Notes (Signed)
Patient CBG was 66. She was awake and alert. Hypoglycemic protocol ordered. Let her drink juice. Will re check sugar after 15 mins. MD notified.

## 2022-10-03 NOTE — Progress Notes (Signed)
ANTICOAGULATION CONSULT NOTE - Initial Consult  Pharmacy Consult for warfarin  Indication: atrial fibrillation  Allergies  Allergen Reactions   Levofloxacin Itching   Morphine And Related Itching and Other (See Comments)    Pt reports oxycodone history with no allergy.    Vital Signs: Temp: 98.2 F (36.8 C) (01/07 0905) Temp Source: Oral (01/07 0905) BP: 118/50 (01/07 0905) Pulse Rate: 110 (01/07 0906)  Labs: Recent Labs    10/01/22 1509 10/01/22 1756 10/02/22 0512 10/03/22 0056  HGB 11.8*  --  10.0* 10.6*  HCT 36.2  --  30.7* 31.9*  PLT 278  --  270 259  LABPROT  --  39.9* 42.8* 32.6*  INR  --  4.2* 4.6* 3.2*  CREATININE 1.19*  --  1.22* 1.50*     Estimated Creatinine Clearance: 44.5 mL/min (A) (by C-G formula based on SCr of 1.5 mg/dL (H)).   Medical History: Past Medical History:  Diagnosis Date   Arthritis    "knees" (02/05/2016)   Cancer ([place]) 5/10   [facility]    CHF (congestive heart failure) (HCC)    Coronary artery disease    GERD (gastroesophageal reflux disease)    tx meds   H/O hiatal hernia    History of blood transfusion "several"   last april 2015 s/p knee surgery (02/05/2016)   Hypercholesteremia    Hypertension    Lung cancer ([place])    2010, surgery 18% left lung no radiation or chemo   Non-STEMI (non-ST elevated myocardial infarction) ([place]) 04/07/2012   See cath results below   Paroxysmal atrial fibrillation ([place]) 04/07/2012   On Warfarin   Peripheral vascular disease ([place]) 8/12   Lt SFA PTA   Presence of stent in LAD coronary artery 04/07/2012   Xience [employer]ition DES 2.75 mm x 18 mm (dilated to 3.0 mm)   Sleep apnea    does not wear CPAP   Type II diabetes mellitus (HCC)    insulin dependent   Assessment: Patient admitted with CC of knee pain and recurring falls. History of afib on warfarin. PTA patient was taking warfarin 7.5mg  daily except 1/2 tablet on M/W/F.   INR trended down to 3.2 today. We will give a reduced dose today.    Goal of Therapy:  INR 2-3 Monitor platelets by anticoagulation protocol: Yes   Plan:  Coumadin 2mg  PO x1 Daily INR.  Onnie Boer, PharmD, BCIDP, AAHIVP, CPP Infectious Disease Pharmacist 10/03/2022 10:29 AM

## 2022-10-03 NOTE — Progress Notes (Signed)
PROGRESS NOTE    [name]  ZOX:[id number] DOB: 05-Feb-1951 DOA: 10/01/2022 PCP: [name], MD  Outpatient Specialists:     Brief Narrative:  Patient is a 72 year old African-American female past medical history significant for lung ca s/p surgery,hx of  CAD s/p stent LAD, HLD,Paroxysmal atrial fibrillation on warfarin ,  OSA does not wear cpap, CHFpef, PAD with chronic venostasis with b/l lower extremity ulcerations on calves followed by vascular and wound center, type 2 diabetes on insulin,  severe djd of b/l knees s/p TKR on Left,and morbid obesity who has had progressive debility on chronic debility with recurrent falls at home leading to multiple ED visits for falls.  Patient presents with recurrent falls, failure to thrive, with significant edema of bilateral lower extremities, worse on the right side.  Right lower leg is also warm to touch with some skin changes.  Will increase IV Rocephin to 2 g once daily.  Will add vancomycin.  Will get vascular Doppler ultrasound of lower extremities.  10/03/2022: Doppler ultrasound of lower extremities came back negative.  Patient is currently on IV Rocephin and linezolid.  Patient continues to report burning sensation of lower legs (suspect secondary to diabetic neuropathy).  Will start patient on gabapentin 100 Mg p.o. 3 times daily.  Will discontinue tramadol.  Will add OxyContin.  Lower legs are wrapped.   Assessment & Plan:   Principal Problem:   CHF (congestive heart failure) ([place])  Recurrent Fall due chronic progressive debility: -Due to DJD b/l hips/knees -per patient not able to take care of self at home and family not able to provide 24 hour care -unsafe d/c due to falls  -PT to assess rehab needs  -TOC team to assist with disposition.   Chronic venostasis wounds with concern for possible associated wound infection /cellulitis: -patient with noted drainage on presentation. -Continue IV Rocephin but 2 g daily and  linezolid. -Leukocytosis persists.  WBC is 11.1 today. -monitor wbc count -wound care to see  -Continue diuretics.   -Vascular Doppler ultrasound of lower extremities came back negative.Marland Kitchen   Displaced fracture of the left sixth rib posteriorly. -asymptomatic no complaint of pain appears subacute    CHFpef  -Compensated.     OSA -not compliant with cpap  -monitor pulse ox  -O2 qhs    Hx lung ca s/p surgery: -in remission    CAD s/p stent LAD -no active issues  -resume metoprolol, statin    HLD: -continue statin      Paroxysmal atrial fibrillation - on warfarin  -has supratherapeutic inr at 4.6 (likely due to effect of antibiotics).   PAD with chronic venostasis - hx lower extremity ulcerations on calves  -followed by vascular and wound center -wound care to see while in house    Type 2 diabetes  -on insulin, resume home regimen 70/30 bid  -iss/fs     Severe djd of b/l knees/hip -PT/OT -resume home pain regimen     Morbid obesity -complicates prognosis     DVT prophylaxis: Warfarin. Code Status: Full code. Family Communication:  Disposition Plan: This will depend on hospital course.   Consultants:  None.  Procedures:  None.  Antimicrobials:  IV linezolid.   IV Rocephin.   Subjective: -No fever or chills. -Patient reports burning sensation in both feet.  Objective: Vitals:   10/03/22 0616 10/03/22 0620 10/03/22 0905 10/03/22 0906  BP: (!) 121/53  (!) 118/50   Pulse: (!) 112  (!) 111 (!) 110  Resp: 18  18   Temp: 97.7 F (36.5 C)  98.2 F (36.8 C)   TempSrc: Oral  Oral   SpO2: 100%  98%   Weight:  129.5 kg    Height:        Intake/Output Summary (Last 24 hours) at 10/03/2022 1321 Last data filed at 10/03/2022 4461 Gross per 24 hour  Intake 340 ml  Output 850 ml  Net -510 ml    Filed Weights   10/02/22 1552 10/03/22 0620  Weight: 124.7 kg 129.5 kg    Examination:  General exam: Appears calm and comfortable.  Patient is  morbidly obese.  Patient is pale. Respiratory system: Clear to auscultation.  Cardiovascular system: S1 & S2 heard Gastrointestinal system: Abdomen is morbidly obese, soft and nontender.  Central nervous system: Alert and oriented.  Patient moves all extremities.   Extremities: Bilateral lower extremity edema, significantly worse on the right side.   Data Reviewed: I have personally reviewed following labs and imaging studies  CBC: Recent Labs  Lab 09/29/22 1751 10/01/22 1509 10/02/22 0512 10/03/22 0056  WBC 11.3* 13.3* 10.6* 11.1*  NEUTROABS 9.2* 11.1*  --  7.8*  HGB 11.3* 11.8* 10.0* 10.6*  HCT 35.1* 36.2 30.7* 31.9*  MCV 88.9 88.7 87.7 88.1  PLT 250 278 270 259    Basic Metabolic Panel: Recent Labs  Lab 09/29/22 1751 10/01/22 1509 10/02/22 0512 10/03/22 0056  NA 132* 132* 134* 134*  K 4.5 4.2 3.8 3.7  CL 96* 96* 95* 94*  CO2 27 25 28 27   GLUCOSE 261* 266* 137* 112*  BUN 29* 16 15 19   CREATININE 1.37* 1.19* 1.22* 1.50*  CALCIUM 9.5 9.5 9.2 9.1  MG  --   --   --  1.6*  PHOS  --   --   --  3.2    GFR: Estimated Creatinine Clearance: 44.5 mL/min (A) (by C-G formula based on SCr of 1.5 mg/dL (H)). Liver Function Tests: Recent Labs  Lab 10/01/22 1509 10/02/22 0512 10/03/22 0056  AST 30 28  --   ALT 18 16  --   ALKPHOS 91 80  --   BILITOT 1.3* 0.8  --   PROT 7.8 6.3*  --   ALBUMIN 3.1* 2.6* 2.6*    No results for input(s): "LIPASE", "AMYLASE" in the last 168 hours. No results for input(s): "AMMONIA" in the last 168 hours. Coagulation Profile: Recent Labs  Lab 10/01/22 1756 10/02/22 0512 10/03/22 0056  INR 4.2* 4.6* 3.2*    Cardiac Enzymes: No results for input(s): "CKTOTAL", "CKMB", "CKMBINDEX", "TROPONINI" in the last 168 hours. BNP (last 3 results) No results for input(s): "PROBNP" in the last 8760 hours. HbA1C: No results for input(s): "HGBA1C" in the last 72 hours. CBG: Recent Labs  Lab 10/03/22 0630 10/03/22 1126 10/03/22 1130  10/03/22 1154 10/03/22 1316  GLUCAP 115* 50* 49* 89 74    Lipid Profile: No results for input(s): "CHOL", "HDL", "LDLCALC", "TRIG", "CHOLHDL", "LDLDIRECT" in the last 72 hours. Thyroid Function Tests: No results for input(s): "TSH", "T4TOTAL", "FREET4", "T3FREE", "THYROIDAB" in the last 72 hours. Anemia Panel: No results for input(s): "VITAMINB12", "FOLATE", "FERRITIN", "TIBC", "IRON", "RETICCTPCT" in the last 72 hours. Urine analysis:    Component Value Date/Time   COLORURINE YELLOW 10/01/2022 1951   APPEARANCEUR CLEAR 10/01/2022 1951   LABSPEC 1.008 10/01/2022 1951   PHURINE 6.0 10/01/2022 1951   GLUCOSEU 150 (A) 10/01/2022 1951   HGBUR SMALL (A) 10/01/2022 1951   BILIRUBINUR NEGATIVE 10/01/2022 1951   KETONESUR  5 (A) 10/01/2022 1951   PROTEINUR NEGATIVE 10/01/2022 1951   UROBILINOGEN 0.2 12/21/2013 1129   NITRITE NEGATIVE 10/01/2022 1951   LEUKOCYTESUR NEGATIVE 10/01/2022 1951   Sepsis Labs: @LABRCNTIP (procalcitonin:4,lacticidven:4)  ) Recent Results (from the past 240 hour(s))  Resp panel by RT-PCR (RSV, Flu A&B, Covid) Anterior Nasal Swab     Status: None   Collection Time: 09/29/22  8:20 PM   Specimen: Anterior Nasal Swab  Result Value Ref Range Status   SARS Coronavirus 2 by RT PCR NEGATIVE NEGATIVE Final    Comment: (NOTE) SARS-CoV-2 target nucleic acids are NOT DETECTED.  The SARS-CoV-2 RNA is generally detectable in upper respiratory specimens during the acute phase of infection. The lowest concentration of SARS-CoV-2 viral copies this assay can detect is 138 copies/mL. A negative result does not preclude SARS-Cov-2 infection and should not be used as the sole basis for treatment or other patient management decisions. A negative result may occur with  improper specimen collection/handling, submission of specimen other than nasopharyngeal swab, presence of viral mutation(s) within the areas targeted by this assay, and inadequate number of viral copies(<138  copies/mL). A negative result must be combined with clinical observations, patient history, and epidemiological information. The expected result is Negative.  Fact Sheet for Patients:  EntrepreneurPulse.com.au  Fact Sheet for Healthcare Providers:  IncredibleEmployment.be  This test is no t yet approved or cleared by the [place] FDA and  has been authorized for detection and/or diagnosis of SARS-CoV-2 by FDA under an Emergency Use Authorization (EUA). This EUA will remain  in effect (meaning this test can be used) for the duration of the COVID-19 declaration under Section 564(b)(1) of the Act, 21 U.S.C.section 360bbb-3(b)(1), unless the authorization is terminated  or revoked sooner.       Influenza A by PCR NEGATIVE NEGATIVE Final   Influenza B by PCR NEGATIVE NEGATIVE Final    Comment: (NOTE) The Xpert Xpress SARS-CoV-2/FLU/RSV plus assay is intended as an aid in the diagnosis of influenza from Nasopharyngeal swab specimens and should not be used as a sole basis for treatment. Nasal washings and aspirates are unacceptable for Xpert Xpress SARS-CoV-2/FLU/RSV testing.  Fact Sheet for Patients: EntrepreneurPulse.com.au  Fact Sheet for Healthcare Providers: IncredibleEmployment.be  This test is not yet approved or cleared by the [place] FDA and has been authorized for detection and/or diagnosis of SARS-CoV-2 by FDA under an Emergency Use Authorization (EUA). This EUA will remain in effect (meaning this test can be used) for the duration of the COVID-19 declaration under Section 564(b)(1) of the Act, 21 U.S.C. section 360bbb-3(b)(1), unless the authorization is terminated or revoked.     Resp Syncytial Virus by PCR NEGATIVE NEGATIVE Final    Comment: (NOTE) Fact Sheet for Patients: EntrepreneurPulse.com.au  Fact Sheet for Healthcare  Providers: IncredibleEmployment.be  This test is not yet approved or cleared by the [place] FDA and has been authorized for detection and/or diagnosis of SARS-CoV-2 by FDA under an Emergency Use Authorization (EUA). This EUA will remain in effect (meaning this test can be used) for the duration of the COVID-19 declaration under Section 564(b)(1) of the Act, 21 U.S.C. section 360bbb-3(b)(1), unless the authorization is terminated or revoked.  Performed at [facility], [place] [address] [place], [place] [address]   Culture, blood (Routine X 2) w Reflex to ID Panel     Status: None (Preliminary result)   Collection Time: 10/01/22 11:48 PM   Specimen: BLOOD RIGHT FOREARM  Result Value Ref Range  Status   Specimen Description BLOOD RIGHT FOREARM  Final   Special Requests   Final    BOTTLES DRAWN AEROBIC AND ANAEROBIC Blood Culture results may not be optimal due to an excessive volume of blood received in culture bottles   Culture   Final    NO GROWTH 1 DAY Performed at [facility], [address] [place], [place] [address]    Report Status PENDING  Incomplete  Urine Culture     Status: Abnormal   Collection Time: 10/02/22  3:07 AM   Specimen: Urine, Clean Catch  Result Value Ref Range Status   Specimen Description URINE, CLEAN CATCH  Final   Special Requests   Final    NONE Performed at [facility], [address] [place], [place] [address]    Culture MULTIPLE SPECIES PRESENT, SUGGEST RECOLLECTION (A)  Final   Report Status 10/03/2022 FINAL  Final  Culture, blood (Routine X 2) w Reflex to ID Panel     Status: None (Preliminary result)   Collection Time: 10/02/22  5:01 PM   Specimen: BLOOD  Result Value Ref Range Status   Specimen Description BLOOD BLOOD RIGHT ARM  Final   Special Requests   Final    BOTTLES DRAWN AEROBIC AND ANAEROBIC Blood Culture results may not be optimal due to an inadequate volume of blood received in  culture bottles   Culture   Final    NO GROWTH < 24 HOURS Performed at [facility], [address] [place], [place] [address]    Report Status PENDING  Incomplete  MRSA Next Gen by PCR, Nasal     Status: None   Collection Time: 10/03/22  1:23 AM   Specimen: Nasal Mucosa; Nasal Swab  Result Value Ref Range Status   MRSA by PCR Next Gen NOT DETECTED NOT DETECTED Final    Comment: (NOTE) The GeneXpert MRSA Assay (FDA approved for NASAL specimens only), is one component of a comprehensive MRSA colonization surveillance program. It is not intended to diagnose MRSA infection nor to guide or monitor treatment for MRSA infections. Test performance is not FDA approved in patients less than 13 years old. Performed at [facility], [address], [place], [place] [address]          Radiology Studies: VAS [place] LOWER EXTREMITY VENOUS (DVT)  Result Date: 10/03/2022  Lower Venous DVT Study Patient Name:  [name]  Date of Exam:   10/03/2022 Medical Rec #: [id number]           Accession #:    [id number] Date of Birth: April 18, 1951            Patient Gender: F Patient Age:   34 years Exam Location:  [facility] Procedure:      VAS [place] LOWER EXTREMITY VENOUS (DVT) Referring Phys: Jacqlyn Krauss Tesla Boch[place]chio --------------------------------------------------------------------------------  Indications: Edema and pain bilaterally. Other Indications: Chronic venous stasis with ulceration,. Limitations: Today's examination was severely limited secondary to body habitus, tissue properties, patient pain, skin changes, and overlying edema. Comparison Study: No recent prior venous orders. Prior ABI performed                   06-25-2022. Performing Technologist: Jean Rosenthal RDMS, RVT  Examination Guidelines: A complete evaluation includes B-mode imaging, spectral Doppler, color Doppler, and power Doppler as needed of all accessible portions of each vessel. Bilateral testing is considered an integral  part of a complete examination. Limited  examinations for reoccurring indications may be performed as noted. The reflux portion of the exam is performed with the patient in reverse Trendelenburg.  +---------+---------------+---------+-----------+----------+-------------------+ RIGHT    CompressibilityPhasicitySpontaneityPropertiesThrombus Aging      +---------+---------------+---------+-----------+----------+-------------------+ CFV      Full           Yes      Yes                                      +---------+---------------+---------+-----------+----------+-------------------+ SFJ      Full                                                             +---------+---------------+---------+-----------+----------+-------------------+ FV Prox  Full                                                             +---------+---------------+---------+-----------+----------+-------------------+ FV Mid                  Yes      Yes                                      +---------+---------------+---------+-----------+----------+-------------------+ FV Distal                                             Not well visualized +---------+---------------+---------+-----------+----------+-------------------+ PFV      Full                                                             +---------+---------------+---------+-----------+----------+-------------------+ POP                     Yes      Yes                                      +---------+---------------+---------+-----------+----------+-------------------+ PTV                     Yes      Yes                                      +---------+---------------+---------+-----------+----------+-------------------+ PERO                                                  Not well visualized +---------+---------------+---------+-----------+----------+-------------------+    +---------+---------------+---------+-----------+----------+-------------------+  LEFT     CompressibilityPhasicitySpontaneityPropertiesThrombus Aging      +---------+---------------+---------+-----------+----------+-------------------+ CFV      Full           Yes      Yes                                      +---------+---------------+---------+-----------+----------+-------------------+ SFJ      Full                                                             +---------+---------------+---------+-----------+----------+-------------------+ FV Prox  Full                                                             +---------+---------------+---------+-----------+----------+-------------------+ FV Mid                  Yes      Yes                                      +---------+---------------+---------+-----------+----------+-------------------+ FV Distal               Yes      Yes                                      +---------+---------------+---------+-----------+----------+-------------------+ PFV      Full                                                             +---------+---------------+---------+-----------+----------+-------------------+ POP                     Yes      Yes                                      +---------+---------------+---------+-----------+----------+-------------------+ PTV                                                   Not well visualized +---------+---------------+---------+-----------+----------+-------------------+ PERO                                                  Not well visualized +---------+---------------+---------+-----------+----------+-------------------+     Summary: RIGHT: - There is no evidence of deep vein thrombosis in the lower extremity. However, portions of this examination  were limited- see technologist comments above.  - No cystic structure found in the popliteal fossa.  LEFT: - There is  no evidence of deep vein thrombosis in the lower extremity. However, portions of this examination were limited- see technologist comments above.  - No cystic structure found in the popliteal fossa.  *See table(s) above for measurements and observations.    Preliminary    DG Ribs Unilateral W/Chest Left  Result Date: 10/01/2022 CLINICAL DATA:  pain EXAM: LEFT RIBS AND CHEST - 3+ VIEW COMPARISON:  Chest x-ray July 14, 2021. FINDINGS: Displaced fracture of the left sixth rib posteriorly. No obvious pneumothorax on these limited supine radiographs. No definite consolidation. Polyarticular degenerative change. Cardiomediastinal silhouette is enlarged, similar prior. Median sternotomy. IMPRESSION: Displaced fracture of the left sixth rib posteriorly. No obvious pneumothorax on these limited supine radiographs. Electronically Signed   By: Feliberto Harts M.D.   On: 10/01/2022 16:57   DG Hip Unilat With Pelvis 2-3 Views Left  Result Date: 10/01/2022 CLINICAL DATA:  Fall.  Pain. EXAM: DG HIP (WITH OR WITHOUT PELVIS) 2-3V LEFT; DG HIP (WITH OR WITHOUT PELVIS) 2-3V RIGHT COMPARISON:  Right hip radiographs 09/01/2022 FINDINGS: There is diffuse decreased bone mineralization. Mild bilateral sacroiliac subchondral sclerosis. The bilateral femoroacetabular joint spaces are maintained. No acute fracture is seen. No dislocation. IMPRESSION: 1. No acute fracture. 2. Mild bilateral sacroiliac osteoarthritis. Electronically Signed   By: Neita Garnet M.D.   On: 10/01/2022 16:57   DG Hip Unilat With Pelvis 2-3 Views Right  Result Date: 10/01/2022 CLINICAL DATA:  Fall.  Pain. EXAM: DG HIP (WITH OR WITHOUT PELVIS) 2-3V LEFT; DG HIP (WITH OR WITHOUT PELVIS) 2-3V RIGHT COMPARISON:  Right hip radiographs 09/01/2022 FINDINGS: There is diffuse decreased bone mineralization. Mild bilateral sacroiliac subchondral sclerosis. The bilateral femoroacetabular joint spaces are maintained. No acute fracture is seen. No dislocation.  IMPRESSION: 1. No acute fracture. 2. Mild bilateral sacroiliac osteoarthritis. Electronically Signed   By: Neita Garnet M.D.   On: 10/01/2022 16:57   DG Shoulder Right  Result Date: 10/01/2022 CLINICAL DATA:  Fall with shoulder pain EXAM: RIGHT SHOULDER - 3 VIEW COMPARISON:  Right shoulder radiographs dated 04/22/2008, CT chest dated 07/06/2022 FINDINGS: There is no evidence of fracture or dislocation. Mild degenerative changes of the glenohumeral and acromioclavicular joints. Soft tissues are unremarkable. Linear calcific density projecting inferomedial to the glenoid likely reflects atherosclerotic calcification in this region. Partially imaged calcifications in the right breast. IMPRESSION: 1. No acute fracture or dislocation. 2. Mild degenerative changes of the glenohumeral and acromioclavicular joints. Electronically Signed   By: Agustin Cree M.D.   On: 10/01/2022 16:56   DG Shoulder Left  Result Date: 10/01/2022 CLINICAL DATA:  Pain, fall. EXAM: LEFT SHOULDER - 2+ VIEW COMPARISON:  None Available. FINDINGS: There is no evidence of fracture or dislocation. Soft tissue calcifications adjacent to the greater tuberosity are likely related to calcific tendinitis. There is mild degenerative narrowing of the joint spaces. IMPRESSION: 1. No fracture or dislocation. Electronically Signed   By: Darliss Cheney M.D.   On: 10/01/2022 16:53   CT Cervical Spine Wo Contrast  Result Date: 10/01/2022 CLINICAL DATA:  Larey Seat, right knee pain EXAM: CT CERVICAL SPINE WITHOUT CONTRAST TECHNIQUE: Multidetector CT imaging of the cervical spine was performed without intravenous contrast. Multiplanar CT image reconstructions were also generated. RADIATION DOSE REDUCTION: This exam was performed according to the departmental dose-optimization program which includes automated exposure control, adjustment of the mA and/or kV according to patient  size and/or use of iterative reconstruction technique. COMPARISON:  None Available.  FINDINGS: Alignment: Alignment is grossly anatomic. Skull base and vertebrae: No acute fracture. No primary bone lesion or focal pathologic process. Soft tissues and spinal canal: No prevertebral fluid or swelling. No visible canal hematoma. Disc levels:  Mild multilevel spondylosis greatest at C5-6. Upper chest: Airway is patent. Visualized portions of the lung apices are clear. Other: Reconstructed images demonstrate no additional findings. IMPRESSION: 1. No acute cervical spine fracture. Electronically Signed   By: Sharlet Salina M.D.   On: 10/01/2022 16:10   CT Head Wo Contrast  Result Date: 10/01/2022 CLINICAL DATA:  Larey Seat 2 days ago, anticoagulated EXAM: CT HEAD WITHOUT CONTRAST TECHNIQUE: Contiguous axial images were obtained from the base of the skull through the vertex without intravenous contrast. RADIATION DOSE REDUCTION: This exam was performed according to the departmental dose-optimization program which includes automated exposure control, adjustment of the mA and/or kV according to patient size and/or use of iterative reconstruction technique. COMPARISON:  01/31/2012 FINDINGS: Brain: No acute infarct or hemorrhage. Lateral ventricles and midline structures are unremarkable. No acute extra-axial fluid collections. No mass effect. Vascular: No hyperdense vessel or unexpected calcification. Skull: Normal. Negative for fracture or focal lesion. Sinuses/Orbits: No acute finding. Other: None. IMPRESSION: 1. No acute intracranial process. Electronically Signed   By: Sharlet Salina M.D.   On: 10/01/2022 16:08        Scheduled Meds:  cholecalciferol  2,000 Units Oral Daily   furosemide  40 mg Oral Q M,W,F   gabapentin  100 mg Oral TID   insulin aspart  0-15 Units Subcutaneous TID WC   linezolid  600 mg Oral BID   metoprolol succinate  50 mg Oral Daily   pantoprazole  40 mg Oral QHS   simvastatin  40 mg Oral q1800   warfarin  2 mg Oral ONCE-1600   Warfarin - Pharmacist Dosing Inpatient   Does  not apply q1600   Continuous Infusions:  albumin human     cefTRIAXone (ROCEPHIN)  IV Stopped (10/02/22 1847)   magnesium sulfate bolus IVPB       LOS: 2 days    Time spent: 55 minutes.    Berton Mount, MD  [facility] Pager #: [phone] 7PM-7AM contact night coverage as above

## 2022-10-03 NOTE — Progress Notes (Signed)
Lower extremity venous bilateral study completed.   Please see CV Proc for preliminary results.   Chesnee Floren, RDMS, RVT  

## 2022-10-03 NOTE — TOC Initial Note (Signed)
Transition of Care [facility]) - Initial/Assessment Note    Patient Details  Name: [name] MRN: [id number] Date of Birth: [birth year]  Transition of Care [facility]) CM/SW Contact:    [name], [place] Phone Number: 10/03/2022, 3:11 PM  Clinical Narrative:                  CSW spoke with pt, she is agreeable to SNF placement, she states she has been to SNF in the past but doesn't have a preference this time. CSW explained Medicare.gov ratings and insurance auth process explained, no further questions. TOC will continue to follow.         Patient Goals and CMS Choice            Expected Discharge Plan and Services                                              Prior Living Arrangements/Services                       Activities of Daily Living Home Assistive Devices/Equipment: Shower chair with back, Eyeglasses, Dentures (specify type), Wheelchair, Environmental consultant (specify type) ADL Screening (condition at time of admission) Patient's cognitive ability adequate to safely complete daily activities?: Yes Is the patient deaf or have difficulty hearing?: No Does the patient have difficulty seeing, even when wearing glasses/contacts?: No Does the patient have difficulty concentrating, remembering, or making decisions?: No Patient able to express need for assistance with ADLs?: Yes Does the patient have difficulty dressing or bathing?: Yes Independently performs ADLs?: No Does the patient have difficulty walking or climbing stairs?: Yes Weakness of Legs: Both Weakness of Arms/Hands: None  Permission Sought/Granted                  Emotional Assessment              Admission diagnosis:  Tachypnea [R06.82] CHF (congestive heart failure) ([place]) [I50.9] Weakness [R53.1] Frequent falls [R29.6] Closed fracture of one rib of left side, initial encounter [S22.32XA] Acute on chronic congestive heart failure, unspecified heart failure type ([place])  [I50.9] Patient Active Problem List   Diagnosis Date Noted   CHF (congestive heart failure) ([place]) 10/01/2022   Atrial flutter ([place]) 07/14/2021   Chest pain with moderate risk for cardiac etiology 07/14/2021   COPD (chronic obstructive pulmonary disease) ([place]) 12/05/2020   Hypercholesterolemia 01/11/2018   Diabetes mellitus type 2 in obese ([place]) 01/11/2018   Pulmonary nodules/lesions, multiple 11/23/2017   RBBB 10/31/2017   S/P CABG x 1 08/17/2017   Abnormal nuclear stress test 02/05/2016   Chest pain with high risk for cardiac etiology 02/05/2016   CAD S/P percutaneous coronary angioplasty 02/05/2016   GERD (gastroesophageal reflux disease) 01/06/2016   CKD (chronic kidney disease) stage 3, GFR 30-59 ml/min (HCC) 01/06/2016   Chronic diastolic heart failure ([place]) 06/28/2015   Esophageal reflux 02/15/2014   Anemia 01/17/2014   S/P left TK revision 12/31/2013   Knee osteomyelitis, left ([place]) 11/21/2013   Foreign body of knee with infection 11/18/2013   Unspecified constipation 11/07/2013   Expected blood loss anemia 11/06/2013   Left knee spacer, following removal of joint prosthesis 11/05/2013   DM (diabetes mellitus), type 2 with peripheral vascular complications ([place]) 38/46/6599   Onychomycosis 10/23/2013   Pain, foot 10/23/2013   DJD (degenerative joint disease) of  knee 04/10/2013   Vaginal atrophy 01/01/2013   OAB (overactive bladder) 01/01/2013   Osteopenia 01/01/2013   Chest pain 11/28/2012   Palpitations 11/22/2012   Dyslipidemia 11/22/2012   Chronic anticoagulation 11/22/2012   Super obese 07/11/2012   Trimalleolar fracture 07/07/2012   Coronary artery disease involving native coronary artery of native heart without angina pectoris 04/08/2012   ICM, EF 40-45% cath 04/07/12- improved to 50-60% by echo Oct 2013 04/08/2012   Paroxysmal atrial fibrillation ([place]) 04/07/2012   NSTEMI (non-ST elevated myocardial infarction)- 7/13 04/07/2012   Unstable angina ([place]) 04/07/2012    PVD (peripheral vascular disease), Lt SFA PTA Aug 2012 04/07/2012   Cancer of left lung parenchyma ([place]) 01/13/2009   Insulin dependent diabetes mellitus ([place]) 11/27/2008   Essential hypertension 11/27/2008   PCP:  [name], MD Pharmacy:   [id number] - [place], [place] [address] Phone: [phone] Fax: [phone]     Social Determinants of Health (SDOH) Social History: SDOH Screenings   Food Insecurity: No Food Insecurity (10/02/2022)  Housing: Low Risk  (10/02/2022)  Transportation Needs: No Transportation Needs (10/02/2022)  Utilities: Not At Risk (10/02/2022)  Depression (PHQ2-9): Low Risk  (05/07/2020)  Stress: Stress Concern Present (11/14/2019)  Tobacco Use: Medium Risk (10/01/2022)   SDOH Interventions:     Readmission Risk Interventions     No data to display

## 2022-10-03 NOTE — NC FL2 (Signed)
[place] LEVEL OF CARE FORM     IDENTIFICATION  Patient Name: [name] Birthdate: [birth year] Sex: female Admission Date (Current Location): 10/01/2022  [facility] and [place] Number:  Herbalist and Address:  [facility] [place], [place] [address], [place], [place] [address]      Provider Number: [id number]  Attending Physician Name and Address:  Bonnell Public, MD  Relative Name and Phone Number:  Zina Pitzer [phone] (    Current Level of Care: Hospital Recommended Level of Care: [place] Prior Approval Number:    Date Approved/Denied:   PASRR Number: [id number]  Discharge Plan: SNF    Current Diagnoses: Patient Active Problem List   Diagnosis Date Noted   CHF (congestive heart failure) ([place]) 10/01/2022   Atrial flutter ([place]) 07/14/2021   Chest pain with moderate risk for cardiac etiology 07/14/2021   COPD (chronic obstructive pulmonary disease) ([place]) 12/05/2020   Hypercholesterolemia 01/11/2018   Diabetes mellitus type 2 in obese ([place]) 01/11/2018   Pulmonary nodules/lesions, multiple 11/23/2017   RBBB 10/31/2017   S/P CABG x 1 08/17/2017   Abnormal nuclear stress test 02/05/2016   Chest pain with high risk for cardiac etiology 02/05/2016   CAD S/P percutaneous coronary angioplasty 02/05/2016   GERD (gastroesophageal reflux disease) 01/06/2016   CKD (chronic kidney disease) stage 3, GFR 30-59 ml/min (HCC) 01/06/2016   Chronic diastolic heart failure ([place]) 06/28/2015   Esophageal reflux 02/15/2014   Anemia 01/17/2014   S/P left TK revision 12/31/2013   Knee osteomyelitis, left ([place]) 11/21/2013   Foreign body of knee with infection 11/18/2013   Unspecified constipation 11/07/2013   Expected blood loss anemia 11/06/2013   Left knee spacer, following removal of joint prosthesis 11/05/2013   DM (diabetes mellitus), type 2 with peripheral vascular complications ([place]) 93/81/0175    Onychomycosis 10/23/2013   Pain, foot 10/23/2013   DJD (degenerative joint disease) of knee 04/10/2013   Vaginal atrophy 01/01/2013   OAB (overactive bladder) 01/01/2013   Osteopenia 01/01/2013   Chest pain 11/28/2012   Palpitations 11/22/2012   Dyslipidemia 11/22/2012   Chronic anticoagulation 11/22/2012   Super obese 07/11/2012   Trimalleolar fracture 07/07/2012   Coronary artery disease involving native coronary artery of native heart without angina pectoris 04/08/2012   ICM, EF 40-45% cath 04/07/12- improved to 50-60% by echo Oct 2013 04/08/2012   Paroxysmal atrial fibrillation ([place]) 04/07/2012   NSTEMI (non-ST elevated myocardial infarction)- 7/13 04/07/2012   Unstable angina ([place]) 04/07/2012   PVD (peripheral vascular disease), Lt SFA PTA Aug 2012 04/07/2012   Cancer of left lung parenchyma ([place]) 01/13/2009   Insulin dependent diabetes mellitus ([place]) 11/27/2008   Essential hypertension 11/27/2008    Orientation RESPIRATION BLADDER Height & Weight     Self, Time, Situation, Place  Normal Continent, External catheter Weight: 285 lb 7.9 oz (129.5 kg) Height:  5\' 3"  (160 cm)  BEHAVIORAL SYMPTOMS/MOOD NEUROLOGICAL BOWEL NUTRITION STATUS      Continent Diet (see dc summary)  AMBULATORY STATUS COMMUNICATION OF NEEDS Skin   Extensive Assist Verbally Normal                       Personal Care Assistance Level of Assistance  Bathing, Feeding, Dressing Bathing Assistance: Limited assistance Feeding assistance: Independent Dressing Assistance: Limited assistance     Functional Limitations Info  Sight, Hearing, Speech Sight Info: Adequate Hearing Info: Adequate Speech Info: Adequate    SPECIAL CARE FACTORS  FREQUENCY  OT (By licensed OT), PT (By licensed PT)     PT Frequency: 5x week OT Frequency: 5x week            Contractures Contractures Info: Not present    Additional Factors Info  Code Status, Psychotropic, Allergies, Insulin Sliding Scale Code Status  Info: Full Allergies Info: Levofloxacin   Morphine And Related Psychotropic Info: gabapentin (NEURONTIN) capsule 100 mg Insulin Sliding Scale Info: insulin aspart (novoLOG) injection 0-15 Units       Current Medications (10/03/2022):  This is the current hospital active medication list Current Facility-Administered Medications  Medication Dose Route Frequency Provider Last Rate Last Admin   acetaminophen (TYLENOL) tablet 650 mg  650 mg Oral Q6H PRN Clance Boll, MD   650 mg at 10/02/22 1101   Or   acetaminophen (TYLENOL) suppository 650 mg  650 mg Rectal Q6H PRN Clance Boll, MD       albumin human 25 % solution 12.5 g  12.5 g Intravenous Daily Dana Allan I, MD 60 mL/hr at 10/03/22 1423 12.5 g at 10/03/22 1423   albuterol (PROVENTIL) (2.5 MG/3ML) 0.083% nebulizer solution 2.5 mg  2.5 mg Nebulization Q2H PRN Clance Boll, MD       cefTRIAXone (ROCEPHIN) 2 g in sodium chloride 0.9 % 100 mL IVPB  2 g Intravenous Q24H Bonnell Public, MD   Stopped at 10/02/22 1847   cholecalciferol (VITAMIN D3) 25 MCG (1000 UNIT) tablet 2,000 Units  2,000 Units Oral Daily Myles Rosenthal A, MD   2,000 Units at 10/03/22 4709   furosemide (LASIX) tablet 40 mg  40 mg Oral Q M,W,F Myles Rosenthal A, MD       gabapentin (NEURONTIN) capsule 100 mg  100 mg Oral TID Bonnell Public, MD       hydrocerin (EUCERIN) cream   Topical Daily Dana Allan I, MD       insulin aspart (novoLOG) injection 0-15 Units  0-15 Units Subcutaneous TID WC Myles Rosenthal A, MD   1 Units at 10/02/22 1812   linezolid (ZYVOX) tablet 600 mg  600 mg Oral BID Pham, Minh Q, RPH-CPP   600 mg at 10/03/22 6283   magnesium sulfate IVPB 2 g 50 mL  2 g Intravenous Once Dana Allan I, MD 50 mL/hr at 10/03/22 1546 2 g at 10/03/22 1546   metoprolol succinate (TOPROL-XL) 24 hr tablet 50 mg  50 mg Oral Daily Myles Rosenthal A, MD   50 mg at 10/03/22 0920   metoprolol tartrate (LOPRESSOR) tablet 25 mg  25 mg  Oral PRN Clance Boll, MD       ondansetron [facility]) tablet 4 mg  4 mg Oral Q6H PRN Clance Boll, MD       Or   ondansetron [facility]) injection 4 mg  4 mg Intravenous Q6H PRN Clance Boll, MD       oxyCODONE-acetaminophen (PERCOCET/ROXICET) 5-325 MG per tablet 1 tablet  1 tablet Oral Q6H PRN Dana Allan I, MD   1 tablet at 10/03/22 1427   pantoprazole (PROTONIX) EC tablet 40 mg  40 mg Oral QHS Myles Rosenthal A, MD   40 mg at 10/02/22 2133   simvastatin (ZOCOR) tablet 40 mg  40 mg Oral q1800 Myles Rosenthal A, MD   40 mg at 10/02/22 1811   warfarin (COUMADIN) tablet 2 mg  2 mg Oral ONCE-1600 Pham, Odis Hollingshead, RPH-CPP       Warfarin - Pharmacist Dosing Inpatient  Does not apply [place], [facility]         Discharge Medications: Please see discharge summary for a list of discharge medications.  Relevant Imaging Results:  Relevant Lab Results:   Additional Information    Ubah Radke B Athena Baltz, LCSWA

## 2022-10-03 NOTE — Consult Note (Signed)
[facility] Nurse Consult Note: Reason for Consult:Concern for bilateral LEs. Patient is followed at the outpatient [facility]. Last seen on 09/15/22 when wounds were healed. Patient was to use her Juxtalite compression devices daily, applying in the morning and removing at night. She is inquiring about Unna's Boots.  Wound type:venous insufficiency, chronic Pressure Injury POA: N/A Measurement:N/A Wound bed:N/A Drainage (amount, consistency, odor)None  Periwound:edema Dressing procedure/placement/frequency: I will provide guidance for topical care to the bilateral LEs while in house using a daily skin cleanse and application of Eucerin cream. Areas of previous wounding will be protected with silicone foam dressings. This will be followed by a dry boot: Kerlix roll gauze topped with ACE bandages and applied from just below toes to just below knees.Patient to have feet placed into Prevalon boots while in bed.   Patient would benefit from a return visit to the outpatient wound care center to discuss obstacles to wearing her Juxtalite compression devices and the pros and cons of Unna's boot therapy with a provider well familiar with her care and diseases/ulcer management. If you agree, please order/advise she contact the outpatient [facility] upon discharge.  [facility] nursing team will not follow, but will remain available to this patient, the nursing and medical teams.  Please re-consult if needed.  Thank you for inviting [place] to participate in this patient's Plan of Care.  Maudie Flakes, MSN, RN, CNS, [place], Serita Grammes, [employer], [employer] phone:  [phone]

## 2022-10-04 ENCOUNTER — Inpatient Hospital Stay (HOSPITAL_COMMUNITY): Payer: HMO

## 2022-10-04 DIAGNOSIS — I50813 Acute on chronic right heart failure: Secondary | ICD-10-CM | POA: Diagnosis not present

## 2022-10-04 LAB — RENAL FUNCTION PANEL
Albumin: 2.4 g/dL — ABNORMAL LOW (ref 3.5–5.0)
Anion gap: 9 (ref 5–15)
BUN: 21 mg/dL (ref 8–23)
CO2: 30 mmol/L (ref 22–32)
Calcium: 8.7 mg/dL — ABNORMAL LOW (ref 8.9–10.3)
Chloride: 93 mmol/L — ABNORMAL LOW (ref 98–111)
Creatinine, Ser: 1.32 mg/dL — ABNORMAL HIGH (ref 0.44–1.00)
GFR, Estimated: 43 mL/min — ABNORMAL LOW (ref >60)
Glucose, Bld: 128 mg/dL — ABNORMAL HIGH (ref 70–99)
Phosphorus: 3.4 mg/dL (ref 2.5–4.6)
Potassium: 3.6 mmol/L (ref 3.5–5.1)
Sodium: 132 mmol/L — ABNORMAL LOW (ref 135–145)

## 2022-10-04 LAB — HEMOGLOBIN A1C
Hgb A1c MFr Bld: 8.8 % — ABNORMAL HIGH (ref 4.8–5.6)
Mean Plasma Glucose: 206 mg/dL

## 2022-10-04 LAB — GLUCOSE, CAPILLARY
Glucose-Capillary: 147 mg/dL — ABNORMAL HIGH (ref 70–99)
Glucose-Capillary: 203 mg/dL — ABNORMAL HIGH (ref 70–99)
Glucose-Capillary: 124 mg/dL — ABNORMAL HIGH (ref 70–99)
Glucose-Capillary: 125 mg/dL — ABNORMAL HIGH (ref 70–99)

## 2022-10-04 LAB — MAGNESIUM: Magnesium: 2 mg/dL (ref 1.7–2.4)

## 2022-10-04 LAB — PROTIME-INR
INR: 2.2 — ABNORMAL HIGH (ref 0.8–1.2)
Prothrombin Time: 23.9 seconds — ABNORMAL HIGH (ref 11.4–15.2)

## 2022-10-04 MED ORDER — INSULIN ASPART PROT & ASPART (70-30 MIX) 100 UNIT/ML ~~LOC~~ SUSP
25.0000 [IU] | Freq: Every day | SUBCUTANEOUS | Status: DC
  Administered 2022-10-04 – 2022-10-08 (×5): 25 [IU] via SUBCUTANEOUS

## 2022-10-04 MED ORDER — GABAPENTIN 300 MG PO CAPS
300.0000 mg | ORAL_CAPSULE | Freq: Once | ORAL | Status: AC
  Administered 2022-10-05: 300 mg via ORAL
  Filled 2022-10-04: qty 1

## 2022-10-04 MED ORDER — INSULIN ASPART PROT & ASPART (70-30 MIX) 100 UNIT/ML ~~LOC~~ SUSP
10.0000 [IU] | Freq: Every day | SUBCUTANEOUS | Status: DC
  Administered 2022-10-04 – 2022-10-07 (×4): 10 [IU] via SUBCUTANEOUS

## 2022-10-04 MED ORDER — FUROSEMIDE 40 MG PO TABS
40.0000 mg | ORAL_TABLET | Freq: Every day | ORAL | Status: DC
  Administered 2022-10-04 – 2022-10-08 (×5): 40 mg via ORAL
  Filled 2022-10-04 (×5): qty 1

## 2022-10-04 MED ORDER — WARFARIN SODIUM 7.5 MG PO TABS: 7.5000 mg | ORAL_TABLET | Freq: Once | ORAL | Status: DC

## 2022-10-04 MED ORDER — LIDOCAINE 5 % EX PTCH
1.0000 | MEDICATED_PATCH | CUTANEOUS | Status: DC
  Administered 2022-10-05: 1 via TRANSDERMAL
  Filled 2022-10-04 (×3): qty 1

## 2022-10-04 MED ORDER — DICLOFENAC SODIUM 1 % EX GEL
2.0000 g | Freq: Four times a day (QID) | CUTANEOUS | Status: DC
  Administered 2022-10-04 – 2022-10-08 (×9): 2 g via TOPICAL
  Filled 2022-10-04: qty 100

## 2022-10-04 MED ORDER — METHOCARBAMOL 500 MG PO TABS
500.0000 mg | ORAL_TABLET | Freq: Three times a day (TID) | ORAL | Status: DC
  Administered 2022-10-04 – 2022-10-08 (×13): 500 mg via ORAL
  Filled 2022-10-04 (×13): qty 1

## 2022-10-04 NOTE — Evaluation (Signed)
Occupational Therapy Evaluation Patient Details Name: [name] MRN: [id number] DOB: 01/07/1951 Today's Date: 10/04/2022   History of Present Illness Pt is a 72 y.o. female who presented 10/01/22 with weakness and multiple falls. Imaging revealed a displaced fracture of the left sixth rib posteriorly. PMH: lung ca s/p surgery, hx of CAD s/p stent LAD, HLD, paroxysmal afib on warfarin, OSA, CHF, PAD with chronic venostasis with b/l lower extremity ulcerations on calves, DM2, obesity, severe DJD of bil knees s/p TKR on left, non-STEMI   Clinical Impression   Pt reports independence at baseline with ADLs and functional mobility, lives with son who works 3rd shift and is not available to help as he sleeps during the day. Pt currently limited by R knee and bil calf pain (wounds), needing mod-max A for ADLs, max A + 2 for bed mobility and standing attempts with RW. Pt attempted x3 and pt unable to clear hips from bed. Pt presenting with impairments listed below, will follow acutely. Recommend SNF at d/c.      Recommendations for follow up therapy are one component of a multi-disciplinary discharge planning process, led by the attending physician.  Recommendations may be updated based on patient status, additional functional criteria and insurance authorization.   Follow Up Recommendations  Skilled nursing-short term rehab (<3 hours/day)     Assistance Recommended at Discharge Frequent or constant Supervision/Assistance  Patient can return home with the following A lot of help with walking and/or transfers;Two people to help with bathing/dressing/bathroom;Assistance with cooking/housework;Assist for transportation;Help with stairs or ramp for entrance    Functional Status Assessment  Patient has had a recent decline in their functional status and demonstrates the ability to make significant improvements in function in a reasonable and predictable amount of time.  Equipment Recommendations   Wheelchair (measurements OT);Wheelchair cushion (measurements OT)    Recommendations for Other Services PT consult     Precautions / Restrictions Precautions Precautions: Fall;Other (comment) Precaution Comments: watch HR; bil calf wounds Restrictions Weight Bearing Restrictions: No      Mobility Bed Mobility Overal bed mobility: Needs Assistance Bed Mobility: Supine to Sit, Sit to Supine     Supine to sit: Max assist Sit to supine: Max assist, +2 for physical assistance   General bed mobility comments: increased time/assist needed due to R knee pain/bil calf wound pain    Transfers Overall transfer level: Needs assistance Equipment used: Rolling walker (2 wheels) Transfers: Sit to/from Stand Sit to Stand: Max assist, +2 physical assistance                  Balance Overall balance assessment: Needs assistance Sitting-balance support: Feet supported Sitting balance-Leahy Scale: Fair     Standing balance support: During functional activity, Bilateral upper extremity supported Standing balance-Leahy Scale: Zero                             ADL either performed or assessed with clinical judgement   ADL Overall ADL's : Needs assistance/impaired Eating/Feeding: Set up   Grooming: Set up   Upper Body Bathing: Moderate assistance   Lower Body Bathing: Maximal assistance   Upper Body Dressing : Moderate assistance   Lower Body Dressing: Maximal assistance   Toilet Transfer: Maximal assistance;+2 for physical assistance   Toileting- Clothing Manipulation and Hygiene: Maximal assistance       Functional mobility during ADLs: Maximal assistance;+2 for physical assistance       Vision  Vision Assessment?: No apparent visual deficits     Perception Perception Perception Tested?: No   Praxis Praxis Praxis tested?: Not tested    Pertinent Vitals/Pain Pain Assessment Pain Assessment: Faces Pain Score: 10-Worst pain ever Faces Pain  Scale: Hurts worst Pain Location: R knee Pain Descriptors / Indicators: Discomfort, Grimacing, Guarding Pain Intervention(s): Limited activity within patient's tolerance, Monitored during session, Repositioned     Hand Dominance     Extremity/Trunk Assessment Upper Extremity Assessment Upper Extremity Assessment: Generalized weakness   Lower Extremity Assessment Lower Extremity Assessment: Defer to PT evaluation   Cervical / Trunk Assessment Cervical / Trunk Assessment: Other exceptions Cervical / Trunk Exceptions: increased body habitus   Communication Communication Communication: HOH   Cognition Arousal/Alertness: Awake/alert Behavior During Therapy: Anxious Overall Cognitive Status: Within Functional Limits for tasks assessed                                       General Comments  VSS on RA    Exercises     Shoulder Instructions      Home Living Family/patient expects to be discharged to:: Skilled nursing facility Living Arrangements: Children Available Help at Discharge: Family;Available PRN/intermittently (works 3rd shift, sleeps during the day) Type of Home: House Home Access: Stairs to enter [employer] of Steps: 1 Entrance Stairs-Rails: None Home Layout: One level     Bathroom Shower/Tub: Sponge bathes at baseline;Tub/shower unit   Bathroom Toilet: Handicapped height     Home Equipment: Conservation officer, nature (2 wheels);Rollator (4 wheels);BSC/3in1;Shower seat;Wheelchair - Scientist, physiological: Reacher;Sock aid;Long-handled Conservation officer, historic buildings;Other (Comment) (long handle toilet aid)        Prior Functioning/Environment Prior Level of Function : Needs assist;Driving  Cognitive Assist : ADLs (cognitive)         ADLs (physical): Bathing;IADLs Mobility Comments: Uses rollator for short household distances. Multiple falls, x2 in past month and x3 in past 6 months. Only uses car to get to appointments, does not do  shopping. ADLs Comments: Son does set-up for sponge baths as pt reports difficulty walking to bathroom to shower. She does drive. She dresses herself and uses AE for pericare. Son does cooking and cleaning. She manages her own meds and finances. Only uses car to get to appointments, does not do shopping.        OT Problem List: Decreased strength;Decreased range of motion;Decreased activity tolerance;Impaired balance (sitting and/or standing)      OT Treatment/Interventions: Self-care/ADL training;Therapeutic exercise;Neuromuscular education;DME and/or AE instruction;Therapeutic activities;Patient/family education;Balance training;Energy conservation    OT Goals(Current goals can be found in the care plan section) Acute Rehab OT Goals Patient Stated Goal: none stated OT Goal Formulation: With patient Time For Goal Achievement: 10/18/22 Potential to Achieve Goals: Good ADL Goals Pt Will Perform Upper Body Dressing: with min assist;sitting Pt Will Perform Lower Body Dressing: with mod assist;sitting/lateral leans;bed level;with adaptive equipment Pt Will Transfer to Toilet: with mod assist;with +2 assist;squat pivot transfer;stand pivot transfer;bedside commode Pt Will Perform Tub/Shower Transfer: Tub transfer;Shower transfer;with mod assist;ambulating;shower seat;rolling walker  OT Frequency: Min 2X/week    Co-evaluation              AM-PAC OT "6 Clicks" Daily Activity     Outcome Measure Help from another person eating meals?: None Help from another person taking care of personal grooming?: A Little Help from another person toileting, which includes using toliet, bedpan, or urinal?:  A Lot Help from another person bathing (including washing, rinsing, drying)?: A Lot Help from another person to put on and taking off regular upper body clothing?: A Lot Help from another person to put on and taking off regular lower body clothing?: A Lot 6 Click Score: 15   End of Session  Equipment Utilized During Treatment: Gait belt;Rolling walker (2 wheels) Nurse Communication: Mobility status  Activity Tolerance: Patient limited by fatigue;Patient limited by pain Patient left: in bed;with call bell/phone within reach;with bed alarm set;with nursing/sitter in room  OT Visit Diagnosis: Unsteadiness on feet (R26.81);Other abnormalities of gait and mobility (R26.89);Muscle weakness (generalized) (M62.81)                Time: 0827-0909 OT Time Calculation (min): 42 min Charges:  OT General Charges $OT Visit: 1 Visit OT Evaluation $OT Eval Moderate Complexity: 1 Mod OT Treatments $Therapeutic Activity: 23-37 mins  Renaye Rakers, OTD, OTR/L SecureChat Preferred Acute Rehab [phone]   Renaye Rakers [place] 10/04/2022, 9:16 AM

## 2022-10-04 NOTE — Care Management Important Message (Signed)
Important Message  Patient Details  Name: DMIYA MALPHRUS MRN: [id number] Date of Birth: 01/22/1951   Medicare Important Message Given:  Yes     Shelda Altes 10/04/2022, 7:52 AM

## 2022-10-04 NOTE — Progress Notes (Signed)
PROGRESS NOTE    [name]  RJJ:[id number] DOB: 07/11/1951 DOA: 10/01/2022 PCP: [name], MD   Brief Narrative:  Patient is a 72 year old African-American female past medical history significant for lung ca s/p surgery,hx of  CAD s/p stent LAD, HLD,Paroxysmal atrial fibrillation on warfarin ,  OSA does not wear cpap, CHFpef, PAD with chronic venostasis with b/l lower extremity ulcerations on calves followed by vascular and wound center, type 2 diabetes on insulin,  severe djd of b/l knees s/p TKR on Left,and morbid obesity who has had progressive debility, worsening right leg and knee pain with recurrent falls at home leading to multiple ED visits for falls.chronic wounds of left leg, started on antibiotics for possible cellulitis -Noted to have increased right knee pain, significantly limited range of motion  Assessment & Plan:   Right knee pain Recurrent Falls,  due chronic progressive debility due to DJD b/l hips/knees -Known advanced osteoarthritis right knee, followed by Dr. Marlou Sa, has declined knee replacement in the past -Will obtain x-ray R KNEE -Orthopedics consult, question if she may benefit from intra-articular injection, unfortunately she is also on warfarin -PT following, SNF recommended, she is unable to mobilize right leg at all so doubtful utility with rehab under the circumstances -Add Voltaren gel, Robaxin  -Morbid obesity complicates all this   Chronic venostasis wounds with concern for possible associated wound infection /cellulitis  -patient with noted drainage on presentation. -Continue IV Rocephin day 3 -wound care to see  -Continue diuretics, venous duplex negative for DVT   Displaced fracture of the left sixth rib posteriorly. -asymptomatic no complaint of pain appears subacute    Chronic diastolic CHF -Last echo 01/60 with EF of 55%, RV normal, moderate TR  -Clinically appears compensated.  Continue oral torsemide -Poor candidate for  SGLT2i   OSA -not compliant with cpap  -monitor pulse ox  -O2 qhs    Hx lung ca s/p surgery -in remission    CAD s/p stent LAD -no active issues  -resume metoprolol, statin   Paroxysmal atrial fibrillation - on warfarin  -INR therapeutic, will hold warfarin tonight, await orthopedics input   PAD with chronic venostasis - hx lower extremity ulcerations on calves  -followed by vascular and wound center -wound care following   Type 2 diabetes  -on insulin, resume insulin 70/30 bid at lower dose -iss/fs     Morbid obesity -complicates prognosis     DVT prophylaxis: Warfarin. Code Status: Full code. Family Communication: none present Disposition Plan: SNF   Consultants:  Orthopedics  Procedures:  None.  Antimicrobials:  IV Rocephin.   Subjective: -Continues to report pain and difficulty moving her right leg Objective: Vitals:   10/03/22 2038 10/04/22 0209 10/04/22 0544 10/04/22 0912  BP: (!) 125/56  118/64 (!) 118/58  Pulse: (!) 111  99 (!) 102  Resp: 18  18 (!) 22  Temp: 98.1 F (36.7 C)  97.8 F (36.6 C) 97.9 F (36.6 C)  TempSrc: Oral  Oral Oral  SpO2: 98%  100%   Weight:  131.2 kg    Height:        Intake/Output Summary (Last 24 hours) at 10/04/2022 1011 Last data filed at 10/04/2022 0917 Gross per 24 hour  Intake 562.63 ml  Output 1600 ml  Net -1037.37 ml   Filed Weights   10/02/22 1552 10/03/22 0620 10/04/22 0209  Weight: 124.7 kg 129.5 kg 131.2 kg    Examination:  General exam: Obese chronically ill female sitting up in bed,  AAOx3, no distress HEENT: Neck obese unable to assess JVD CVS: S1-S2, regular rhythm Lungs: Decreased breath sounds to bases otherwise clear Abdomen: Soft, nontender, bowel sounds present Extremities: Left leg with small superficial wounds,, right knee with pain, significantly limited range of motion   Data Reviewed: I have personally reviewed following labs and imaging studies  CBC: Recent Labs  Lab  09/29/22 1751 10/01/22 1509 10/02/22 0512 10/03/22 0056  WBC 11.3* 13.3* 10.6* 11.1*  NEUTROABS 9.2* 11.1*  --  7.8*  HGB 11.3* 11.8* 10.0* 10.6*  HCT 35.1* 36.2 30.7* 31.9*  MCV 88.9 88.7 87.7 88.1  PLT 250 278 270 638   Basic Metabolic Panel: Recent Labs  Lab 09/29/22 1751 10/01/22 1509 10/02/22 0512 10/03/22 0056 10/04/22 0042  NA 132* 132* 134* 134* 132*  K 4.5 4.2 3.8 3.7 3.6  CL 96* 96* 95* 94* 93*  CO2 27 25 28 27 30   GLUCOSE 261* 266* 137* 112* 128*  BUN 29* 16 15 19 21   CREATININE 1.37* 1.19* 1.22* 1.50* 1.32*  CALCIUM 9.5 9.5 9.2 9.1 8.7*  MG  --   --   --  1.6* 2.0  PHOS  --   --   --  3.2 3.4   GFR: Estimated Creatinine Clearance: 51 mL/min (A) (by C-G formula based on SCr of 1.32 mg/dL (H)). Liver Function Tests: Recent Labs  Lab 10/01/22 1509 10/02/22 0512 10/03/22 0056 10/04/22 0042  AST 30 28  --   --   ALT 18 16  --   --   ALKPHOS 91 80  --   --   BILITOT 1.3* 0.8  --   --   PROT 7.8 6.3*  --   --   ALBUMIN 3.1* 2.6* 2.6* 2.4*   No results for input(s): "LIPASE", "AMYLASE" in the last 168 hours. No results for input(s): "AMMONIA" in the last 168 hours. Coagulation Profile: Recent Labs  Lab 10/01/22 1756 10/02/22 0512 10/03/22 0056 10/04/22 0042  INR 4.2* 4.6* 3.2* 2.2*   Cardiac Enzymes: No results for input(s): "CKTOTAL", "CKMB", "CKMBINDEX", "TROPONINI" in the last 168 hours. BNP (last 3 results) No results for input(s): "PROBNP" in the last 8760 hours. HbA1C: No results for input(s): "HGBA1C" in the last 72 hours. CBG: Recent Labs  Lab 10/03/22 1348 10/03/22 1541 10/03/22 1656 10/03/22 2036 10/04/22 0604  GLUCAP 100* 108* 98 138* 147*   Lipid Profile: No results for input(s): "CHOL", "HDL", "LDLCALC", "TRIG", "CHOLHDL", "LDLDIRECT" in the last 72 hours. Thyroid Function Tests: No results for input(s): "TSH", "T4TOTAL", "FREET4", "T3FREE", "THYROIDAB" in the last 72 hours. Anemia Panel: No results for input(s):  "VITAMINB12", "FOLATE", "FERRITIN", "TIBC", "IRON", "RETICCTPCT" in the last 72 hours. Urine analysis:    Component Value Date/Time   COLORURINE YELLOW 10/01/2022 1951   APPEARANCEUR CLEAR 10/01/2022 1951   LABSPEC 1.008 10/01/2022 1951   PHURINE 6.0 10/01/2022 1951   GLUCOSEU 150 (A) 10/01/2022 1951   HGBUR SMALL (A) 10/01/2022 [place] NEGATIVE 10/01/2022 1951   KETONESUR 5 (A) 10/01/2022 1951   PROTEINUR NEGATIVE 10/01/2022 1951   UROBILINOGEN 0.2 12/21/2013 1129   NITRITE NEGATIVE 10/01/2022 1951   LEUKOCYTESUR NEGATIVE 10/01/2022 1951   Sepsis Labs: @LABRCNTIP (procalcitonin:4,lacticidven:4)  ) Recent Results (from the past 240 hour(s))  Resp panel by RT-PCR (RSV, Flu A&B, Covid) Anterior Nasal Swab     Status: None   Collection Time: 09/29/22  8:20 PM   Specimen: Anterior Nasal Swab  Result Value Ref Range Status   SARS  Coronavirus 2 by RT PCR NEGATIVE NEGATIVE Final    Comment: (NOTE) SARS-CoV-2 target nucleic acids are NOT DETECTED.  The SARS-CoV-2 RNA is generally detectable in upper respiratory specimens during the acute phase of infection. The lowest concentration of SARS-CoV-2 viral copies this assay can detect is 138 copies/mL. A negative result does not preclude SARS-Cov-2 infection and should not be used as the sole basis for treatment or other patient management decisions. A negative result may occur with  improper specimen collection/handling, submission of specimen other than nasopharyngeal swab, presence of viral mutation(s) within the areas targeted by this assay, and inadequate number of viral copies(<138 copies/mL). A negative result must be combined with clinical observations, patient history, and epidemiological information. The expected result is Negative.  Fact Sheet for Patients:  EntrepreneurPulse.com.au  Fact Sheet for Healthcare Providers:  IncredibleEmployment.be  This test is no t yet approved  or cleared by the [place] FDA and  has been authorized for detection and/or diagnosis of SARS-CoV-2 by FDA under an Emergency Use Authorization (EUA). This EUA will remain  in effect (meaning this test can be used) for the duration of the COVID-19 declaration under Section 564(b)(1) of the Act, 21 U.S.C.section 360bbb-3(b)(1), unless the authorization is terminated  or revoked sooner.       Influenza A by PCR NEGATIVE NEGATIVE Final   Influenza B by PCR NEGATIVE NEGATIVE Final    Comment: (NOTE) The Xpert Xpress SARS-CoV-2/FLU/RSV plus assay is intended as an aid in the diagnosis of influenza from Nasopharyngeal swab specimens and should not be used as a sole basis for treatment. Nasal washings and aspirates are unacceptable for Xpert Xpress SARS-CoV-2/FLU/RSV testing.  Fact Sheet for Patients: EntrepreneurPulse.com.au  Fact Sheet for Healthcare Providers: IncredibleEmployment.be  This test is not yet approved or cleared by the [place] FDA and has been authorized for detection and/or diagnosis of SARS-CoV-2 by FDA under an Emergency Use Authorization (EUA). This EUA will remain in effect (meaning this test can be used) for the duration of the COVID-19 declaration under Section 564(b)(1) of the Act, 21 U.S.C. section 360bbb-3(b)(1), unless the authorization is terminated or revoked.     Resp Syncytial Virus by PCR NEGATIVE NEGATIVE Final    Comment: (NOTE) Fact Sheet for Patients: EntrepreneurPulse.com.au  Fact Sheet for Healthcare Providers: IncredibleEmployment.be  This test is not yet approved or cleared by the [place] FDA and has been authorized for detection and/or diagnosis of SARS-CoV-2 by FDA under an Emergency Use Authorization (EUA). This EUA will remain in effect (meaning this test can be used) for the duration of the COVID-19 declaration under Section 564(b)(1) of the Act,  21 U.S.C. section 360bbb-3(b)(1), unless the authorization is terminated or revoked.  Performed at [facility], [place] [address] [place], [place] [address]   Culture, blood (Routine X 2) w Reflex to ID Panel     Status: None (Preliminary result)   Collection Time: 10/01/22 11:48 PM   Specimen: BLOOD RIGHT FOREARM  Result Value Ref Range Status   Specimen Description BLOOD RIGHT FOREARM  Final   Special Requests   Final    BOTTLES DRAWN AEROBIC AND ANAEROBIC Blood Culture results may not be optimal due to an excessive volume of blood received in culture bottles   Culture   Final    NO GROWTH 2 DAYS Performed at [facility], [place] [address] [place], [place] [address]    Report Status PENDING  Incomplete  Urine Culture  Status: Abnormal   Collection Time: 10/02/22  3:07 AM   Specimen: Urine, Clean Catch  Result Value Ref Range Status   Specimen Description URINE, CLEAN CATCH  Final   Special Requests   Final    NONE Performed at [facility], [address] [place], [place] [address]    Culture MULTIPLE SPECIES PRESENT, SUGGEST RECOLLECTION (A)  Final   Report Status 10/03/2022 FINAL  Final  Culture, blood (Routine X 2) w Reflex to ID Panel     Status: None (Preliminary result)   Collection Time: 10/02/22  5:01 PM   Specimen: BLOOD  Result Value Ref Range Status   Specimen Description BLOOD BLOOD RIGHT ARM  Final   Special Requests   Final    BOTTLES DRAWN AEROBIC AND ANAEROBIC Blood Culture results may not be optimal due to an inadequate volume of blood received in culture bottles   Culture   Final    NO GROWTH 2 DAYS Performed at [facility], [place] [address] [place], [place] [address]    Report Status PENDING  Incomplete  MRSA Next Gen by PCR, Nasal     Status: None   Collection Time: 10/03/22  1:23 AM   Specimen: Nasal Mucosa; Nasal Swab  Result Value Ref Range Status   MRSA by PCR Next Gen NOT DETECTED NOT DETECTED Final     Comment: (NOTE) The GeneXpert MRSA Assay (FDA approved for NASAL specimens only), is one component of a comprehensive MRSA colonization surveillance program. It is not intended to diagnose MRSA infection nor to guide or monitor treatment for MRSA infections. Test performance is not FDA approved in patients less than 62 years old. Performed at [facility], [place] [address] [place], [place] [address]          Radiology Studies: VAS [place] LOWER EXTREMITY VENOUS (DVT)  Result Date: 10/03/2022  Lower Venous DVT Study Patient Name:  ZYLPHA POYNOR  Date of Exam:   10/03/2022 Medical Rec #: [id number]           Accession #:    [id number] Date of Birth: 21-Dec-1950            Patient Gender: F Patient Age:   20 years Exam Location:  [facility] Procedure:      VAS [place] LOWER EXTREMITY VENOUS (DVT) Referring Phys: Yehuda Savannah OGBATA --------------------------------------------------------------------------------  Indications: Edema and pain bilaterally. Other Indications: Chronic venous stasis with ulceration,. Limitations: Today's examination was severely limited secondary to body habitus, tissue properties, patient pain, skin changes, and overlying edema. Comparison Study: No recent prior venous orders. Prior ABI performed                   06-25-2022. Performing Technologist: Darlin Coco RDMS, RVT  Examination Guidelines: A complete evaluation includes B-mode imaging, spectral Doppler, color Doppler, and power Doppler as needed of all accessible portions of each vessel. Bilateral testing is considered an integral part of a complete examination. Limited examinations for reoccurring indications may be performed as noted. The reflux portion of the exam is performed with the patient in reverse Trendelenburg.  +---------+---------------+---------+-----------+----------+-------------------+ RIGHT    CompressibilityPhasicitySpontaneityPropertiesThrombus Aging       +---------+---------------+---------+-----------+----------+-------------------+ CFV      Full           Yes      Yes                                      +---------+---------------+---------+-----------+----------+-------------------+  SFJ      Full                                                             +---------+---------------+---------+-----------+----------+-------------------+ FV Prox  Full                                                             +---------+---------------+---------+-----------+----------+-------------------+ FV Mid                  Yes      Yes                                      +---------+---------------+---------+-----------+----------+-------------------+ FV Distal                                             Not well visualized +---------+---------------+---------+-----------+----------+-------------------+ PFV      Full                                                             +---------+---------------+---------+-----------+----------+-------------------+ POP                     Yes      Yes                                      +---------+---------------+---------+-----------+----------+-------------------+ PTV                     Yes      Yes                                      +---------+---------------+---------+-----------+----------+-------------------+ PERO                                                  Not well visualized +---------+---------------+---------+-----------+----------+-------------------+   +---------+---------------+---------+-----------+----------+-------------------+ LEFT     CompressibilityPhasicitySpontaneityPropertiesThrombus Aging      +---------+---------------+---------+-----------+----------+-------------------+ CFV      Full           Yes      Yes                                      +---------+---------------+---------+-----------+----------+-------------------+  SFJ      Full                                                             +---------+---------------+---------+-----------+----------+-------------------+  FV Prox  Full                                                             +---------+---------------+---------+-----------+----------+-------------------+ FV Mid                  Yes      Yes                                      +---------+---------------+---------+-----------+----------+-------------------+ FV Distal               Yes      Yes                                      +---------+---------------+---------+-----------+----------+-------------------+ PFV      Full                                                             +---------+---------------+---------+-----------+----------+-------------------+ POP                     Yes      Yes                                      +---------+---------------+---------+-----------+----------+-------------------+ PTV                                                   Not well visualized +---------+---------------+---------+-----------+----------+-------------------+ PERO                                                  Not well visualized +---------+---------------+---------+-----------+----------+-------------------+     Summary: RIGHT: - There is no evidence of deep vein thrombosis in the lower extremity. However, portions of this examination were limited- see technologist comments above.  - No cystic structure found in the popliteal fossa.  LEFT: - There is no evidence of deep vein thrombosis in the lower extremity. However, portions of this examination were limited- see technologist comments above.  - No cystic structure found in the popliteal fossa.  *See table(s) above for measurements and observations. Electronically signed by Orlie Pollen on 10/03/2022 at 3:02:15 PM.    Final         Scheduled Meds:  cholecalciferol  2,000 Units Oral Daily    furosemide  40 mg Oral Q M,W,F   gabapentin  100 mg Oral TID   hydrocerin   Topical Daily   insulin aspart  0-15 Units Subcutaneous TID WC   insulin aspart protamine- aspart  10 Units Subcutaneous Q supper   insulin aspart protamine-  aspart  25 Units Subcutaneous Q breakfast   linezolid  600 mg Oral BID   metoprolol succinate  50 mg Oral Daily   pantoprazole  40 mg Oral QHS   simvastatin  40 mg Oral q1800   warfarin  7.5 mg Oral ONCE-1600   Warfarin - Pharmacist Dosing Inpatient   Does not apply q1600   Continuous Infusions:  albumin human Stopped (10/03/22 1502)   cefTRIAXone (ROCEPHIN)  IV Stopped (10/03/22 2305)     LOS: 3 days    Time spent: 45 minutes.    Domenic Polite, MD 7PM-7AM contact night coverage as above

## 2022-10-04 NOTE — TOC Progression Note (Signed)
Transition of Care [facility]) - Progression Note    Patient Details  Name: [name] MRN: [id number] Date of Birth: [birth year]  Transition of [facility]) CM/SW [place], LCSW Phone Number: 10/04/2022, 1:22 PM  Clinical Narrative:  CSW met with pt at bedside to provide list of SNF bed offers. Pt was agreeable to [facility]. CSW will call [employer] to get insurance auth. CSW reached out to [facility] to inform them that pt should dc to facility between 1-2 days.     Expected Discharge Plan: [place] Barriers to Discharge: Continued Medical Work up  Expected Discharge Plan and Services In-house Referral: Clinical Social Work     Living arrangements for the past 2 months: Single Family Home                                       Social Determinants of Health (SDOH) Interventions [place]: No Food Insecurity (10/02/2022)  Housing: Low Risk  (10/02/2022)  Transportation Needs: No Transportation Needs (10/02/2022)  Utilities: Not At Risk (10/02/2022)  Depression (PHQ2-9): Low Risk  (05/07/2020)  Stress: Stress Concern Present (11/14/2019)  Tobacco Use: Medium Risk (10/01/2022)    Readmission Risk Interventions     No data to display         Beckey Rutter, MSW, LCSWA, LCASA Transitions of Care  Clinical Social Worker I

## 2022-10-04 NOTE — Progress Notes (Signed)
ANTICOAGULATION CONSULT NOTE - Initial Consult  Pharmacy Consult for warfarin  Indication: atrial fibrillation  Allergies  Allergen Reactions   Levofloxacin Itching   Morphine And Related Itching and Other (See Comments)    Pt reports oxycodone history with no allergy.    Vital Signs: Temp: 97.8 F (36.6 C) (01/08 0544) Temp Source: Oral (01/08 0544) BP: 118/64 (01/08 0544) Pulse Rate: 99 (01/08 0544)  Labs: Recent Labs    10/01/22 1509 10/01/22 1756 10/02/22 0512 10/03/22 0056 10/04/22 0042  HGB 11.8*  --  10.0* 10.6*  --   HCT 36.2  --  30.7* 31.9*  --   PLT 278  --  270 259  --   LABPROT  --    < > 42.8* 32.6* 23.9*  INR  --    < > 4.6* 3.2* 2.2*  CREATININE 1.19*  --  1.22* 1.50* 1.32*   < > = values in this interval not displayed.     Estimated Creatinine Clearance: 51 mL/min (A) (by C-G formula based on SCr of 1.32 mg/dL (H)).   Medical History: Past Medical History:  Diagnosis Date   Arthritis    "knees" (02/05/2016)   Cancer ([place]) 5/10   LUL [facility]    CHF (congestive heart failure) (HCC)    Coronary artery disease    GERD (gastroesophageal reflux disease)    tx meds   H/O hiatal hernia    History of blood transfusion "several"   last april 2015 s/p knee surgery (02/05/2016)   Hypercholesteremia    Hypertension    Lung cancer ([place])    2010, surgery 18% left lung no radiation or chemo   Non-STEMI (non-ST elevated myocardial infarction) ([place]) 04/07/2012   See cath results below   Paroxysmal atrial fibrillation ([place]) 04/07/2012   On Warfarin   Peripheral vascular disease ([place]) 8/12   Lt SFA PTA   Presence of stent in LAD coronary artery 04/07/2012   Xience [employer]ition DES 2.75 mm x 18 mm (dilated to 3.0 mm)   Sleep apnea    does not wear CPAP   Type II diabetes mellitus (HCC)    insulin dependent   Assessment: Patient admitted with CC of knee pain and recurring falls. History of afib on warfarin. PTA patient was taking warfarin 7.5mg  daily except  1/2 tablet on M/W/F.   INR trended down to 3.2 today. We will give a reduced dose today.   Goal of Therapy:  INR 2-3 Monitor platelets by anticoagulation protocol: Yes   Plan:  Warfarin 7.5mg  x1 to prevent further INR drop Monitor daily INR, weekly CBC Monitor for signs/symptoms of bleeding    Benetta Spar, PharmD, BCPS, BCCP Clinical Pharmacist  Please check AMION for all [place] phone numbers After 10:00 PM, call [place]

## 2022-10-05 ENCOUNTER — Inpatient Hospital Stay (HOSPITAL_COMMUNITY): Payer: HMO

## 2022-10-05 DIAGNOSIS — I50813 Acute on chronic right heart failure: Secondary | ICD-10-CM | POA: Diagnosis not present

## 2022-10-05 DIAGNOSIS — M1711 Unilateral primary osteoarthritis, right knee: Principal | ICD-10-CM

## 2022-10-05 LAB — GLUCOSE, CAPILLARY
Glucose-Capillary: 138 mg/dL — ABNORMAL HIGH (ref 70–99)
Glucose-Capillary: 193 mg/dL — ABNORMAL HIGH (ref 70–99)
Glucose-Capillary: 196 mg/dL — ABNORMAL HIGH (ref 70–99)
Glucose-Capillary: 207 mg/dL — ABNORMAL HIGH (ref 70–99)

## 2022-10-05 LAB — CBC
HCT: 31.9 % — ABNORMAL LOW (ref 36.0–46.0)
Hemoglobin: 10.4 g/dL — ABNORMAL LOW (ref 12.0–15.0)
MCH: 28.9 pg (ref 26.0–34.0)
MCHC: 32.6 g/dL (ref 30.0–36.0)
MCV: 88.6 fL (ref 80.0–100.0)
Platelets: 305 10*3/uL (ref 150–400)
RBC: 3.6 MIL/uL — ABNORMAL LOW (ref 3.87–5.11)
RDW: 13.1 % (ref 11.5–15.5)
WBC: 9.8 10*3/uL (ref 4.0–10.5)
nRBC: 0 % (ref 0.0–0.2)

## 2022-10-05 LAB — BASIC METABOLIC PANEL
Anion gap: 11 (ref 5–15)
BUN: 21 mg/dL (ref 8–23)
CO2: 27 mmol/L (ref 22–32)
Calcium: 8.9 mg/dL (ref 8.9–10.3)
Chloride: 94 mmol/L — ABNORMAL LOW (ref 98–111)
Creatinine, Ser: 1.36 mg/dL — ABNORMAL HIGH (ref 0.44–1.00)
GFR, Estimated: 41 mL/min — ABNORMAL LOW (ref >60)
Glucose, Bld: 168 mg/dL — ABNORMAL HIGH (ref 70–99)
Potassium: 3.7 mmol/L (ref 3.5–5.1)
Sodium: 132 mmol/L — ABNORMAL LOW (ref 135–145)

## 2022-10-05 LAB — PROTIME-INR
INR: 2 — ABNORMAL HIGH (ref 0.8–1.2)
Prothrombin Time: 22.2 seconds — ABNORMAL HIGH (ref 11.4–15.2)

## 2022-10-05 MED ORDER — METHYLPREDNISOLONE ACETATE 40 MG/ML IJ SUSP
40.0000 mg | Freq: Once | INTRAMUSCULAR | Status: AC
  Administered 2022-10-05: 40 mg via INTRA_ARTICULAR
  Filled 2022-10-05 (×2): qty 1

## 2022-10-05 MED ORDER — POTASSIUM CHLORIDE CRYS ER 20 MEQ PO TBCR
40.0000 meq | EXTENDED_RELEASE_TABLET | Freq: Once | ORAL | Status: AC
  Administered 2022-10-05: 40 meq via ORAL
  Filled 2022-10-05: qty 2

## 2022-10-05 MED ORDER — BUPIVACAINE HCL (PF) 0.5 % IJ SOLN
10.0000 mL | Freq: Once | INTRAMUSCULAR | Status: AC
  Administered 2022-10-05: 10 mL
  Filled 2022-10-05: qty 10

## 2022-10-05 MED ORDER — LORAZEPAM 2 MG/ML IJ SOLN
1.0000 mg | Freq: Every day | INTRAMUSCULAR | Status: AC | PRN
  Administered 2022-10-05: 1 mg via INTRAVENOUS
  Filled 2022-10-05: qty 1

## 2022-10-05 MED ORDER — CEPHALEXIN 250 MG PO CAPS
250.0000 mg | ORAL_CAPSULE | Freq: Three times a day (TID) | ORAL | Status: AC
  Administered 2022-10-05 – 2022-10-07 (×6): 250 mg via ORAL
  Filled 2022-10-05 (×6): qty 1

## 2022-10-05 NOTE — Progress Notes (Signed)
Brandi, GADEA [name] ([id number]) [id number] Page 1 of 6 Visit Report for 09/15/2022 Arrival Information Details Patient Name: Date of Service: [name], MCMANAWAY [name]. 09/15/2022 10:30 [name] Medical Record Number: [id number] Patient Account Number: [id number] Date of Birth/Sex: Treating RN: [birth year] (72 y.o. [name], [name] Primary Care Tytus Strahle: [name] Other Clinician: Referring Lawernce Earll: Treating Kaitland Lewellyn/Extender: Angelica Pou in Treatment: 14 Visit Information History Since Last Visit Added or deleted any medications: Yes Patient Arrived: Wheel Chair Any new allergies or adverse reactions: No Arrival Time: 10:51 Had [name] fall or experienced change in No Accompanied By: Son activities of daily living that may affect Transfer Assistance: None risk of falls: Patient Identification Verified: Yes Signs or symptoms of abuse/neglect since last visito No Secondary Verification Process Completed: Yes Hospitalized since last visit: No Patient Requires Transmission-Based No Implantable device outside of the clinic excluding No Precautions: cellular tissue based products placed in the center Patient Has Alerts: Yes since last visit: Patient Alerts: VVS ABI [facility] [place] 10/23/21 Pain Present Now: Yes TBI L 0.29 R 0.28 Electronic Signature(s) Signed: 10/04/2022 5:14:49 PM By: Sharyn Creamer RN, BSN Entered By: Sharyn Creamer on 09/15/2022 11:06:17 -------------------------------------------------------------------------------- Clinic Level of Care Assessment Details Patient Name: Date of Service: Brandi, LESKO [name]. 09/15/2022 10:30 [name] Medical Record Number: [id number] Patient Account Number: [id number] Date of Birth/Sex: Treating RN: [birth year] (72 y.o. [name], [name] Primary Care Ketty Bitton: [name] Other Clinician: Referring Deonta Bomberger: Treating Ladarrian Asencio/Extender: Angelica Pou  in Treatment: 14 Clinic Level of Care Assessment Items TOOL 4 Quantity Score X- 1 0 Use when only an EandM is performed on FOLLOW-UP visit ASSESSMENTS - Nursing Assessment / Reassessment X- 1 10 Reassessment of Co-morbidities (includes updates in patient status) X- 1 5 Reassessment of Adherence to Treatment Plan ASSESSMENTS - Wound and Skin [name] ssessment / Reassessment X - Simple Wound Assessment / Reassessment - one wound 1 5 []  - 0 Complex Wound Assessment / Reassessment - multiple wounds []  - 0 Dermatologic / Skin Assessment (not related to wound area) ASSESSMENTS - Focused Assessment X- 1 5 Circumferential Edema Measurements - multi extremities []  - 0 Nutritional Assessment / Counseling / Intervention []  - 0 Lower Extremity Assessment (monofilament, tuning fork, pulses) Artman, Gale [name] ([id number]) [id number] Page 2 of 6 []  - 0 Peripheral Arterial Disease Assessment (using hand held doppler) ASSESSMENTS - Ostomy and/or Continence Assessment and Care []  - 0 Incontinence Assessment and Management []  - 0 Ostomy Care Assessment and Management (repouching, etc.) PROCESS - Coordination of Care X - Simple Patient / Family Education for ongoing care 1 15 []  - 0 Complex (extensive) Patient / Family Education for ongoing care X- 1 10 Staff obtains Programmer, systems, Records, T Results / Process Orders est []  - 0 Staff telephones HHA, Nursing Homes / Clarify orders / etc []  - 0 Routine Transfer to another Facility (non-emergent condition) []  - 0 Routine Hospital Admission (non-emergent condition) []  - 0 New Admissions / Biomedical engineer / Ordering NPWT Apligraf, etc. , []  - 0 Emergency Hospital Admission (emergent condition) X- 1 10 Simple Discharge Coordination []  - 0 Complex (extensive) Discharge Coordination PROCESS - Special Needs []  - 0 Pediatric / Minor Patient Management []  - 0 Isolation Patient Management []  - 0 Hearing / Language  / Visual special needs []  - 0 Assessment of Community assistance (transportation, D/C planning, etc.) []  - 0 Additional assistance / Altered mentation []  - 0 Support Surface(s) Assessment (bed, cushion, seat,  etc.) INTERVENTIONS - Wound Cleansing / Measurement X - Simple Wound Cleansing - one wound 1 5 []  - 0 Complex Wound Cleansing - multiple wounds X- 1 5 Wound Imaging (photographs - any number of wounds) []  - 0 Wound Tracing (instead of photographs) X- 1 5 Simple Wound Measurement - one wound []  - 0 Complex Wound Measurement - multiple wounds INTERVENTIONS - Wound Dressings []  - 0 Small Wound Dressing one or multiple wounds X- 1 15 Medium Wound Dressing one or multiple wounds []  - 0 Large Wound Dressing one or multiple wounds X- 1 5 Application of Medications - topical []  - 0 Application of Medications - injection INTERVENTIONS - Miscellaneous []  - 0 External ear exam []  - 0 Specimen Collection (cultures, biopsies, blood, body fluids, etc.) []  - 0 Specimen(s) / Culture(s) sent or taken to Lab for analysis []  - 0 Patient Transfer (multiple staff / Civil Service fast streamer / Similar devices) []  - 0 Simple Staple / Suture removal (25 or less) []  - 0 Complex Staple / Suture removal (26 or more) []  - 0 Hypo / Hyperglycemic Management (close monitor of Blood Glucose) []  - 0 Ankle / Brachial Index (ABI) - do not check if billed separately ELVENA, OYER [name] ([id number]) [id number] Page 3 of 6 X- 1 5 Vital Signs Has the patient been seen at the hospital within the last three years: Yes Total Score: 100 Level Of Care: New/Established - Level 3 Electronic Signature(s) Signed: 09/15/2022 5:39:23 PM By: Rhae Hammock RN Entered By: Rhae Hammock on 09/15/2022 11:20:54 -------------------------------------------------------------------------------- Encounter Discharge Information Details Patient Name: Date of Service: Brandi Morgans [name].  09/15/2022 10:30 [name] Medical Record Number: [id number] Patient Account Number: [id number] Date of Birth/Sex: Treating RN: [birth year] (72 y.o. [name], [name] Primary Care Dewitte Vannice: [name] Other Clinician: Referring Darvin Dials: Treating Gerrianne Aydelott/Extender: Angelica Pou in Treatment: 14 Encounter Discharge Information Items Discharge Condition: Stable Ambulatory Status: Ambulatory Discharge Destination: Home Transportation: Private Auto Accompanied By: self Schedule Follow-up Appointment: Yes Clinical Summary of Care: Patient Declined Electronic Signature(s) Signed: 09/15/2022 5:39:23 PM By: Rhae Hammock RN Entered By: Rhae Hammock on 09/15/2022 11:21:23 -------------------------------------------------------------------------------- Lower Extremity Assessment Details Patient Name: Date of Service: Brandi Morgans [name]. 09/15/2022 10:30 [name] Medical Record Number: [id number] Patient Account Number: [id number] Date of Birth/Sex: Treating RN: 1950/10/04 (72 y.o. Donalda Ewings Primary Care Lattie Riege: [name] Other Clinician: Referring Izabella Marcantel: Treating Ariea Rochin/Extender: [name], [name] in Treatment: 14 Edema Assessment Assessed: [Left: No] [Right: No] Edema: [Left: Yes] [Right: Yes] Calf Left: Right: Point of Measurement: 34.4 cm From Medial Instep 47.5 cm 52 cm Ankle Left: Right: Point of Measurement: 10 cm From Medial Instep 28.5 cm 28 cm Vascular Assessment Pulses: ATHEA, HALEY [name] ([id number]) [Right:[id number] Page 4 of 6] Dorsalis Pedis Palpable: [Left:Yes] [Right:Yes] Electronic Signature(s) Signed: 10/04/2022 5:14:49 PM By: Sharyn Creamer RN, BSN Entered By: Sharyn Creamer on 09/15/2022 11:10:02 -------------------------------------------------------------------------------- Multi-Disciplinary Care Plan Details Patient Name: Date of Service: Brandi Morgans [name]. 09/15/2022 10:30 [name] Medical Record Number: [id number] Patient Account Number: [id number] Date of Birth/Sex: Treating RN: [birth year] (72 y.o. [name], [name] Primary Care Cally Nygard: [name] Other Clinician: Referring Marika Mahaffy: Treating Cottrell Gentles/Extender: [name], [name] in Treatment: 14 Active Inactive Electronic Signature(s) Signed: 09/15/2022 5:39:23 PM By: Rhae Hammock RN Entered By: Rhae Hammock on 09/15/2022 11:17:50 -------------------------------------------------------------------------------- Pain Assessment Details Patient Name: Date of Service: Brandi Morgans [name]. 09/15/2022 10:30 [name] Medical  Record Number: [id number] Patient Account Number: [id number] Date of Birth/Sex: Treating RN: January 24, 1951 (72 y.o. Donalda Ewings Primary Care Regis Wiland: [name] Other Clinician: Referring Nykolas Bacallao: Treating Allia Wiltsey/Extender: Angelica Pou in Treatment: 14 Active Problems Location of Pain Severity and Description of Pain Patient Has Paino Yes Site Locations Rate the pain. Current Pain Level: 6 Character of Pain Describe the Pain: ELLENA, KAMEN [name] ([id number]) [id number] Page 5 of 6 Pain Management and Medication Current Pain Management: Notes Thigh - ortho managed Electronic Signature(s) Signed: 10/04/2022 5:14:49 PM By: Sharyn Creamer RN, BSN Entered By: Sharyn Creamer on 09/15/2022 11:09:14 -------------------------------------------------------------------------------- Patient/Caregiver Education Details Patient Name: Date of Service: Zara Council 12/20/2023andnbsp10:30 [name] Medical Record Number: [id number] Patient Account Number: [id number] Date of Birth/Gender: Treating RN: 02-20-1951 (72 y.o. [name], [name] Primary Care Physician: [name] Other Clinician: Referring Physician: Treating  Physician/Extender: Angelica Pou in Treatment: 14 Education Assessment Education Provided To: Patient Education Topics Provided Wound/Skin Impairment: Methods: Explain/Verbal Responses: Reinforcements needed, State content correctly Electronic Signature(s) Signed: 09/15/2022 5:39:23 PM By: Rhae Hammock RN Entered By: Rhae Hammock on 09/15/2022 11:18:02 -------------------------------------------------------------------------------- Wound Assessment Details Patient Name: Date of Service: Brandi Morgans [name]. 09/15/2022 10:30 [name] Medical Record Number: [id number] Patient Account Number: [id number] Date of Birth/Sex: Treating RN: 05-13-1951 (72 y.o. Donalda Ewings Primary Care Thekla Colborn: [name] Other Clinician: Referring Joleene Burnham: Treating Edris Friedt/Extender: [name], [name] in Treatment: 14 Wound Status Wound Number: [id number] Primary Lymphedema Etiology: Wound Location: Right, Posterior Lower Leg Wound Open Wounding Event: Blister Status: Date Acquired: 05/26/2022 Comorbid Cataracts, Sleep Apnea, Arrhythmia, Coronary Artery Disease, Weeks Of Treatment: 14 History: Hypertension, Peripheral Venous Disease, Type II Diabetes, Clustered Wound: No Osteoarthritis, Neuropathy Photos TISHANA, CLINKENBEARD [name] ([id number]) [id number] Page 6 of 6 Wound Measurements Length: (cm) Width: (cm) Depth: (cm) Area: (cm) Volume: (cm) 0 % Reduction in Area: 100% 0 % Reduction in Volume: 100% 0 Epithelialization: Large (67-100%) 0 Tunneling: No 0 Undermining: No Wound Description Classification: Full Thickness Without Exposed Support Structures Exudate Amount: None Present Foul Odor After Cleansing: No Slough/Fibrino No Wound Bed Granulation Amount: None Present (0%) Necrotic Amount: None Present (0%) Periwound Skin Texture Texture Color No Abnormalities Noted: Yes No Abnormalities Noted:  Yes Moisture Temperature / Pain No Abnormalities Noted: Yes Temperature: No Abnormality Electronic Signature(s) Signed: 10/04/2022 5:14:49 PM By: Sharyn Creamer RN, BSN Entered By: Sharyn Creamer on 09/15/2022 11:11:43 -------------------------------------------------------------------------------- Vitals Details Patient Name: Date of Service: Brandi Morgans [name]. 09/15/2022 10:30 [name] Medical Record Number: [id number] Patient Account Number: [id number] Date of Birth/Sex: Treating RN: 1951/08/13 (72 y.o. Donalda Ewings Primary Care Andrew Blasius: [name] Other Clinician: Referring Kierstan Auer: Treating Kipton Skillen/Extender: [name], [name] in Treatment: 14 Vital Signs Time Taken: 11:00 Temperature (F): 98.2 Height (in): 63 Pulse (bpm): 109 Weight (lbs): 284 Respiratory Rate (breaths/min): 18 Body Mass Index (BMI): 50.3 Blood Pressure (mmHg): 157/77 Reference Range: 80 - 120 mg / dl Electronic Signature(s) Signed: 10/04/2022 5:14:49 PM By: Sharyn Creamer RN, BSN Entered By: Sharyn Creamer on 09/15/2022 11:08:12

## 2022-10-05 NOTE — Progress Notes (Signed)
ANTICOAGULATION CONSULT NOTE - Initial Consult  Pharmacy Consult for warfarin  Indication: atrial fibrillation  Allergies  Allergen Reactions   Levofloxacin Itching   Morphine And Related Itching and Other (See Comments)    Pt reports oxycodone history with no allergy.    Vital Signs: Temp: 98.3 F (36.8 C) (01/09 0625) Temp Source: Oral (01/09 0625) BP: 121/53 (01/09 0625) Pulse Rate: 99 (01/09 0625)  Labs: Recent Labs    10/03/22 0056 10/04/22 0042 10/05/22 0109  HGB 10.6*  --  10.4*  HCT 31.9*  --  31.9*  PLT 259  --  305  LABPROT 32.6* 23.9* 22.2*  INR 3.2* 2.2* 2.0*  CREATININE 1.50* 1.32* 1.36*     Estimated Creatinine Clearance: 48.7 mL/min (A) (by C-G formula based on SCr of 1.36 mg/dL (H)).   Medical History: Past Medical History:  Diagnosis Date   Arthritis    "knees" (02/05/2016)   Cancer ([place]) 5/10   LUL Vats    CHF (congestive heart failure) (HCC)    Coronary artery disease    GERD (gastroesophageal reflux disease)    tx meds   H/O hiatal hernia    History of blood transfusion "several"   last april 2015 s/p knee surgery (02/05/2016)   Hypercholesteremia    Hypertension    Lung cancer ([place])    2010, surgery 18% left lung no radiation or chemo   Non-STEMI (non-ST elevated myocardial infarction) ([place]) 04/07/2012   See cath results below   Paroxysmal atrial fibrillation ([place]) 04/07/2012   On Warfarin   Peripheral vascular disease ([place]) 8/12   Lt SFA PTA   Presence of stent in LAD coronary artery 04/07/2012   Xience Expedition DES 2.75 mm x 18 mm (dilated to 3.0 mm)   Sleep apnea    does not wear CPAP   Type II diabetes mellitus (HCC)    insulin dependent   Assessment: Patient admitted with CC of knee pain and recurring falls. History of afib on warfarin. PTA patient was taking warfarin 7.5mg  daily except 1/2 tablet on M/W/F.   INR trended down to 2 today. Warfarin held 1/8 for steroid knee injection by ortho. Continue to hold 1/9 per MD.    Goal of Therapy:  INR 2-3 Monitor platelets by anticoagulation protocol: Yes   Plan:  Hold Warfarin, f/u restart  Monitor daily INR, weekly CBC Monitor for signs/symptoms of bleeding    Benetta Spar, PharmD, BCPS, BCCP Clinical Pharmacist  Please check AMION for all [place] phone numbers After 10:00 PM, call [facility] [phone]

## 2022-10-05 NOTE — Progress Notes (Signed)
PROGRESS NOTE    [name]  KLK:[id number] DOB: 1951/08/26 DOA: 10/01/2022 PCP: [name], MD   Brief Narrative:  Patient is a 72 year old African-American female past medical history significant for lung ca s/p surgery,hx of  CAD s/p stent LAD, HLD,Paroxysmal atrial fibrillation on warfarin ,  OSA does not wear cpap, CHFpef, PAD with chronic venostasis with b/l lower extremity ulcerations on calves followed by vascular and wound center, type 2 diabetes on insulin,  severe djd of b/l knees s/p TKR on Left,and morbid obesity who has had progressive debility, worsening right leg and knee pain with recurrent falls at home leading to multiple ED visits for falls.chronic wounds of left leg, started on antibiotics for possible cellulitis -Noted to have increased right knee pain, significantly limited range of motion -d/w Ortho abt intra-articular steroids, concern with Coumadin, they will get back to [place]  Assessment & Plan:   Right knee pain, OA Recurrent Falls,  due chronic progressive debility due to DJD b/l hips/knees -Known advanced OA, followed by Dr. Marlou Sa, has declined knee replacement in the past -X-ray right knee with severe tricompartmental osteoarthritis -Orthopedics consult, question if she may benefit from intra-articular injection, unfortunately she is also on warfarin, can give vitamin K -PT following, SNF recommended, she is unable to mobilize right leg at all so doubtful utility with rehab under the circumstances -Continue Voltaren gel, Robaxin  -Morbid obesity complicates all this   Chronic venostasis wounds with concern for possible associated wound infection /cellulitis  -patient with noted drainage on presentation. -On day 4 of IV Rocephin, changed to oral Keflex for 2 more days -Wound care following -Continue diuretics, venous duplex negative for DVT   Displaced fracture of the left sixth rib posteriorly. -asymptomatic no complaint of pain appears subacute     Chronic diastolic CHF -Last echo 97/94 with EF of 55%, RV normal, moderate TR  -Clinically appears compensated.  Continue oral torsemide -Poor candidate for SGLT2i   OSA -not compliant with cpap  -monitor pulse ox  -O2 qhs    Hx lung ca s/p surgery -in remission    CAD s/p stent LAD -no active issues  -resume metoprolol, statin   Paroxysmal atrial fibrillation - on warfarin  -INR therapeutic, will hold warfarin tonight, await orthopedics input   PAD with chronic venostasis - hx lower extremity ulcerations on calves  -followed by vascular and wound center -wound care following   Type 2 diabetes  -on insulin, resumed insulin 70/30 bid at lower dose -iss/fs     Morbid obesity -complicates prognosis     DVT prophylaxis: Warfarin. Code Status: Full code. Family Communication: none present Disposition Plan: SNF in 1 to 2 days   Consultants:  Orthopedics  Procedures:  None.  Antimicrobials:  IV Rocephin.   Subjective: -Continues to report pain and difficulty moving her right leg Objective: Vitals:   10/04/22 1914 10/05/22 0102 10/05/22 0625 10/05/22 0845  BP: 131/62 (!) 142/60 (!) 121/53 (!) 127/53  Pulse: 99 99 99 (!) 101  Resp:  20 20 18   Temp: 97.7 F (36.5 C) 98.3 F (36.8 C) 98.3 F (36.8 C) 98.1 F (36.7 C)  TempSrc: Oral Oral Oral Oral  SpO2: 100%   92%  Weight:  127.6 kg    Height:        Intake/Output Summary (Last 24 hours) at 10/05/2022 1149 Last data filed at 10/05/2022 0800 Gross per 24 hour  Intake 1209.53 ml  Output 1350 ml  Net -140.47 ml  Filed Weights   10/03/22 0620 10/04/22 0209 10/05/22 0102  Weight: 129.5 kg 131.2 kg 127.6 kg    Examination:  General exam: Obese chronically ill female sitting up in bed, AAOx3, no distress HEENT: Neck obese unable to assess JVD CVS: S1-S2, regular rhythm Lungs: Decreased breath sounds to bases otherwise clear Abdomen: Soft, nontender, bowel sounds present Extremities: Left leg  with small superficial wounds,, right knee with pain, significantly limited range of motion   Data Reviewed: I have personally reviewed following labs and imaging studies  CBC: Recent Labs  Lab 09/29/22 1751 10/01/22 1509 10/02/22 0512 10/03/22 0056 10/05/22 0109  WBC 11.3* 13.3* 10.6* 11.1* 9.8  NEUTROABS 9.2* 11.1*  --  7.8*  --   HGB 11.3* 11.8* 10.0* 10.6* 10.4*  HCT 35.1* 36.2 30.7* 31.9* 31.9*  MCV 88.9 88.7 87.7 88.1 88.6  PLT 250 278 270 259 856   Basic Metabolic Panel: Recent Labs  Lab 10/01/22 1509 10/02/22 0512 10/03/22 0056 10/04/22 0042 10/05/22 0109  NA 132* 134* 134* 132* 132*  K 4.2 3.8 3.7 3.6 3.7  CL 96* 95* 94* 93* 94*  CO2 25 28 27 30 27   GLUCOSE 266* 137* 112* 128* 168*  BUN 16 15 19 21 21   CREATININE 1.19* 1.22* 1.50* 1.32* 1.36*  CALCIUM 9.5 9.2 9.1 8.7* 8.9  MG  --   --  1.6* 2.0  --   PHOS  --   --  3.2 3.4  --    GFR: Estimated Creatinine Clearance: 48.7 mL/min (A) (by C-G formula based on SCr of 1.36 mg/dL (H)). Liver Function Tests: Recent Labs  Lab 10/01/22 1509 10/02/22 0512 10/03/22 0056 10/04/22 0042  AST 30 28  --   --   ALT 18 16  --   --   ALKPHOS 91 80  --   --   BILITOT 1.3* 0.8  --   --   PROT 7.8 6.3*  --   --   ALBUMIN 3.1* 2.6* 2.6* 2.4*   No results for input(s): "LIPASE", "AMYLASE" in the last 168 hours. No results for input(s): "AMMONIA" in the last 168 hours. Coagulation Profile: Recent Labs  Lab 10/01/22 1756 10/02/22 0512 10/03/22 0056 10/04/22 0042 10/05/22 0109  INR 4.2* 4.6* 3.2* 2.2* 2.0*   Cardiac Enzymes: No results for input(s): "CKTOTAL", "CKMB", "CKMBINDEX", "TROPONINI" in the last 168 hours. BNP (last 3 results) No results for input(s): "PROBNP" in the last 8760 hours. HbA1C: No results for input(s): "HGBA1C" in the last 72 hours. CBG: Recent Labs  Lab 10/04/22 1129 10/04/22 1612 10/04/22 2118 10/05/22 0622 10/05/22 1120  GLUCAP 203* 124* 125* 138* 193*   Lipid Profile: No  results for input(s): "CHOL", "HDL", "LDLCALC", "TRIG", "CHOLHDL", "LDLDIRECT" in the last 72 hours. Thyroid Function Tests: No results for input(s): "TSH", "T4TOTAL", "FREET4", "T3FREE", "THYROIDAB" in the last 72 hours. Anemia Panel: No results for input(s): "VITAMINB12", "FOLATE", "FERRITIN", "TIBC", "IRON", "RETICCTPCT" in the last 72 hours. Urine analysis:    Component Value Date/Time   COLORURINE YELLOW 10/01/2022 1951   APPEARANCEUR CLEAR 10/01/2022 1951   LABSPEC 1.008 10/01/2022 1951   PHURINE 6.0 10/01/2022 1951   GLUCOSEU 150 (A) 10/01/2022 1951   HGBUR SMALL (A) 10/01/2022 1951   BILIRUBINUR NEGATIVE 10/01/2022 1951   KETONESUR 5 (A) 10/01/2022 1951   PROTEINUR NEGATIVE 10/01/2022 1951   UROBILINOGEN 0.2 12/21/2013 1129   NITRITE NEGATIVE 10/01/2022 1951   LEUKOCYTESUR NEGATIVE 10/01/2022 1951   Sepsis Labs: @LABRCNTIP (procalcitonin:4,lacticidven:4)  ) Recent Results (  from the past 240 hour(s))  Resp panel by RT-PCR (RSV, Flu A&B, Covid) Anterior Nasal Swab     Status: None   Collection Time: 09/29/22  8:20 PM   Specimen: Anterior Nasal Swab  Result Value Ref Range Status   SARS Coronavirus 2 by RT PCR NEGATIVE NEGATIVE Final    Comment: (NOTE) SARS-CoV-2 target nucleic acids are NOT DETECTED.  The SARS-CoV-2 RNA is generally detectable in upper respiratory specimens during the acute phase of infection. The lowest concentration of SARS-CoV-2 viral copies this assay can detect is 138 copies/mL. A negative result does not preclude SARS-Cov-2 infection and should not be used as the sole basis for treatment or other patient management decisions. A negative result may occur with  improper specimen collection/handling, submission of specimen other than nasopharyngeal swab, presence of viral mutation(s) within the areas targeted by this assay, and inadequate number of viral copies(<138 copies/mL). A negative result must be combined with clinical observations, patient  history, and epidemiological information. The expected result is Negative.  Fact Sheet for Patients:  EntrepreneurPulse.com.au  Fact Sheet for Healthcare Providers:  IncredibleEmployment.be  This test is no t yet approved or cleared by the [place] FDA and  has been authorized for detection and/or diagnosis of SARS-CoV-2 by FDA under an Emergency Use Authorization (EUA). This EUA will remain  in effect (meaning this test can be used) for the duration of the COVID-19 declaration under Section 564(b)(1) of the Act, 21 U.S.C.section 360bbb-3(b)(1), unless the authorization is terminated  or revoked sooner.       Influenza A by PCR NEGATIVE NEGATIVE Final   Influenza B by PCR NEGATIVE NEGATIVE Final    Comment: (NOTE) The Xpert Xpress SARS-CoV-2/FLU/RSV plus assay is intended as an aid in the diagnosis of influenza from Nasopharyngeal swab specimens and should not be used as a sole basis for treatment. Nasal washings and aspirates are unacceptable for Xpert Xpress SARS-CoV-2/FLU/RSV testing.  Fact Sheet for Patients: EntrepreneurPulse.com.au  Fact Sheet for Healthcare Providers: IncredibleEmployment.be  This test is not yet approved or cleared by the [place] FDA and has been authorized for detection and/or diagnosis of SARS-CoV-2 by FDA under an Emergency Use Authorization (EUA). This EUA will remain in effect (meaning this test can be used) for the duration of the COVID-19 declaration under Section 564(b)(1) of the Act, 21 U.S.C. section 360bbb-3(b)(1), unless the authorization is terminated or revoked.     Resp Syncytial Virus by PCR NEGATIVE NEGATIVE Final    Comment: (NOTE) Fact Sheet for Patients: EntrepreneurPulse.com.au  Fact Sheet for Healthcare Providers: IncredibleEmployment.be  This test is not yet approved or cleared by the [place] FDA  and has been authorized for detection and/or diagnosis of SARS-CoV-2 by FDA under an Emergency Use Authorization (EUA). This EUA will remain in effect (meaning this test can be used) for the duration of the COVID-19 declaration under Section 564(b)(1) of the Act, 21 U.S.C. section 360bbb-3(b)(1), unless the authorization is terminated or revoked.  Performed at [facility], [place] [address], [place], [place] [address]   Culture, blood (Routine X 2) w Reflex to ID Panel     Status: None (Preliminary result)   Collection Time: 10/01/22 11:48 PM   Specimen: BLOOD RIGHT FOREARM  Result Value Ref Range Status   Specimen Description BLOOD RIGHT FOREARM  Final   Special Requests   Final    BOTTLES DRAWN AEROBIC AND ANAEROBIC Blood Culture results may not be optimal due to an excessive volume  of blood received in culture bottles   Culture   Final    NO GROWTH 3 DAYS Performed at [facility], [place] [address], [place], [place] [address]    Report Status PENDING  Incomplete  Urine Culture     Status: Abnormal   Collection Time: 10/02/22  3:07 AM   Specimen: Urine, Clean Catch  Result Value Ref Range Status   Specimen Description URINE, CLEAN CATCH  Final   Special Requests   Final    NONE Performed at [facility], [place] [address], [place], [place] [address]    Culture MULTIPLE SPECIES PRESENT, SUGGEST RECOLLECTION (A)  Final   Report Status 10/03/2022 FINAL  Final  Culture, blood (Routine X 2) w Reflex to ID Panel     Status: None (Preliminary result)   Collection Time: 10/02/22  5:01 PM   Specimen: BLOOD  Result Value Ref Range Status   Specimen Description BLOOD BLOOD RIGHT ARM  Final   Special Requests   Final    BOTTLES DRAWN AEROBIC AND ANAEROBIC Blood Culture results may not be optimal due to an inadequate volume of blood received in culture bottles   Culture   Final    NO GROWTH 3 DAYS Performed at [facility], [place] [address],  [place], [place] [address]    Report Status PENDING  Incomplete  MRSA Next Gen by PCR, Nasal     Status: None   Collection Time: 10/03/22  1:23 AM   Specimen: Nasal Mucosa; Nasal Swab  Result Value Ref Range Status   MRSA by PCR Next Gen NOT DETECTED NOT DETECTED Final    Comment: (NOTE) The GeneXpert MRSA Assay (FDA approved for NASAL specimens only), is one component of a comprehensive MRSA colonization surveillance program. It is not intended to diagnose MRSA infection nor to guide or monitor treatment for MRSA infections. Test performance is not FDA approved in patients less than 47 years old. Performed at [facility], [place] [address], [place], [place] [address]          Radiology Studies: DG Knee Complete 4 Views Right  Result Date: 10/04/2022 CLINICAL DATA:  Right knee pain. EXAM: RIGHT KNEE - COMPLETE 4+ VIEW COMPARISON:  Right knee radiographs 09/01/2022 FINDINGS: Severe medial and patellofemoral and moderate lateral compartment joint space narrowing, subchondral sclerosis, and peripheral osteophytosis. No joint effusion. No acute fracture or dislocation. Moderate vascular calcifications. IMPRESSION: Severe medial and patellofemoral and moderate lateral compartment osteoarthritis. Electronically Signed   By: Yvonne Kendall M.D.   On: 10/04/2022 18:54        Scheduled Meds:  cholecalciferol  2,000 Units Oral Daily   diclofenac Sodium  2 g Topical QID   furosemide  40 mg Oral Daily   gabapentin  100 mg Oral TID   hydrocerin   Topical Daily   insulin aspart  0-15 Units Subcutaneous TID WC   insulin aspart protamine- aspart  10 Units Subcutaneous Q supper   insulin aspart protamine- aspart  25 Units Subcutaneous Q breakfast   lidocaine  1 patch Transdermal Q24H   linezolid  600 mg Oral BID   methocarbamol  500 mg Oral TID   metoprolol succinate  50 mg Oral Daily   pantoprazole  40 mg Oral QHS   simvastatin  40 mg Oral q1800   Warfarin - Pharmacist Dosing Inpatient    Does not apply q1600   Continuous Infusions:  albumin human 12.5 g (10/05/22 0920)  cefTRIAXone (ROCEPHIN)  IV 2 g (10/04/22 1752)     LOS: 4 days    Time spent: 35 minutes.    Domenic Polite, MD 7PM-7AM contact night coverage as above

## 2022-10-05 NOTE — Progress Notes (Signed)
Physical Therapy Treatment Patient Details Name: [name] MRN: [id number] DOB: [birth year] Today's Date: 10/05/2022   History of Present Illness Pt is a 72 y.o. female who presented 10/01/22 with weakness and multiple falls. Imaging revealed a displaced fracture of the left sixth rib posteriorly. PMH: lung ca s/p surgery, hx of CAD s/p stent LAD, HLD, paroxysmal afib on warfarin, OSA, CHF, PAD with chronic venostasis with b/l lower extremity ulcerations on calves, DM2, obesity, severe DJD of bil knees s/p TKR on left, non-STEMI    PT Comments    Patient progressing slowly towards PT goals. Session focused on there ex, dynamic sitting balance and functional mobility. Requires Max A of 2 for bed mobility with increased time. Pain in right knee limiting WB today. Participated in there ex of LEs, back and core as well as balance exercises and reaching outside BoS. Lethargic initially but able to stay awake mostly when sitting EOB. VSS on RA throughout. Continue to recommend SNF. Will follow acutely.    Recommendations for follow up therapy are one component of a multi-disciplinary discharge planning process, led by the attending physician.  Recommendations may be updated based on patient status, additional functional criteria and insurance authorization.  Follow Up Recommendations  Skilled nursing-short term rehab (<3 hours/day) Can patient physically be transported by private vehicle: No   Assistance Recommended at Discharge Intermittent Supervision/Assistance  Patient can return home with the following Two people to help with walking and/or transfers;A lot of help with bathing/dressing/bathroom;Assistance with cooking/housework;Assist for transportation;Help with stairs or ramp for entrance   Equipment Recommendations       Recommendations for Other Services       Precautions / Restrictions Precautions Precautions: Fall;Other (comment) Precaution Comments: watch HR; bil calf  wounds Restrictions Weight Bearing Restrictions: No     Mobility  Bed Mobility Overal bed mobility: Needs Assistance Bed Mobility: Supine to Sit, Sit to Supine     Supine to sit: Max assist, +2 for physical assistance Sit to supine: Max assist, +2 for physical assistance   General bed mobility comments: Assist to bring LEs to EOB, scoot bottom and elevate trunk with increased time.    Transfers                   General transfer comment: Deferred due to lethargy    Ambulation/Gait               General Gait Details: unable   Stairs             Wheelchair Mobility    Modified Rankin (Stroke Patients Only)       Balance Overall balance assessment: Needs assistance Sitting-balance support: Feet supported, No upper extremity supported Sitting balance-Leahy Scale: Fair Sitting balance - Comments: supervision for safety. worked on dynamic sitting balance                                    Cognition Arousal/Alertness: Charity fundraiser During Therapy: Anxious Overall Cognitive Status: Within Functional Limits for tasks assessed                                 General Comments: Sleepy initially needing frequent verbal stimulus to stay awake but this improved with sitting EOB.        Exercises General Exercises - Lower Extremity Quad Sets: AROM, Both, 5  reps, Supine Long Arc Quad: AROM, Both, 10 reps, Seated (x2) Hip ABduction/ADduction: AROM, Both, 10 reps, Seated Toe Raises: AROM, Both, 10 reps, Seated Heel Raises: AROM, Both, 10 reps, Seated Other Exercises Other Exercises: Scapular retraction 2x10 Other Exercises: Reaching outside [place] in all directions with BUEs x20 Other Exercises: Reaching down towards feet and coming back up to seated position 2x10    General Comments General comments (skin integrity, edema, etc.): VSS on RA.      Pertinent Vitals/Pain Pain Assessment Pain Assessment: Faces Faces  Pain Scale: Hurts even more Pain Location: right knee with mobility Pain Descriptors / Indicators: Discomfort, Grimacing, Guarding Pain Intervention(s): Monitored during session, Repositioned, Limited activity within patient's tolerance    Home Living                          Prior Function            PT Goals (current goals can now be found in the care plan section) Progress towards PT goals: Progressing toward goals (slowly)    Frequency    Min 2X/week      PT Plan Current plan remains appropriate    Co-evaluation              AM-PAC PT "6 Clicks" Mobility   Outcome Measure  Help needed turning from your back to your side while in a flat bed without using bedrails?: A Lot Help needed moving from lying on your back to sitting on the side of a flat bed without using bedrails?: Total Help needed moving to and from a bed to a chair (including a wheelchair)?: Total Help needed standing up from a chair using your arms (e.g., wheelchair or bedside chair)?: Total Help needed to walk in hospital room?: Total Help needed climbing 3-5 steps with a railing? : Total 6 Click Score: 7    End of Session   Activity Tolerance: Patient limited by pain;Patient tolerated treatment well Patient left: in bed;with call bell/phone within reach;with bed alarm set Nurse Communication: Mobility status;Need for lift equipment PT Visit Diagnosis: Unsteadiness on feet (R26.81);Muscle weakness (generalized) (M62.81);Difficulty in walking, not elsewhere classified (R26.2);History of falling (Z91.81);Pain Pain - Right/Left: Right Pain - part of body: Knee     Time: 1106-1130 PT Time Calculation (min) (ACUTE ONLY): 24 min  Charges:  $Therapeutic Exercise: 8-22 mins $Therapeutic Activity: 8-22 mins                     Marisa Severin, PT, DPT Acute Rehabilitation Services Secure chat preferred Office [place] 10/05/2022, 1:46 PM

## 2022-10-05 NOTE — Consult Note (Signed)
Reason for Consult:Right knee pain Referring Physician: Domenic Polite Time called: 6160 Time at bedside: [name] is an 72 y.o. female.  HPI: [name] was admitted 4d ago with progressive debility and increase right knee pain. She has been seeing Dr. Marlou Sa and has end-stage OA of the knee. Hospitalist reached out to see if steroid injection may help with mobility.  Past Medical History:  Diagnosis Date   Arthritis    "knees" (02/05/2016)   Cancer ([place]) 5/10   LUL Vats    CHF (congestive heart failure) ([place])    Coronary artery disease    GERD (gastroesophageal reflux disease)    tx meds   H/O hiatal hernia    History of blood transfusion "several"   last april 2015 s/p knee surgery (02/05/2016)   Hypercholesteremia    Hypertension    Lung cancer ([place])    2010, surgery 18% left lung no radiation or chemo   Non-STEMI (non-ST elevated myocardial infarction) ([place]) 04/07/2012   See cath results below   Paroxysmal atrial fibrillation ([place]) 04/07/2012   On Warfarin   Peripheral vascular disease ([place]) 8/12   Lt SFA PTA   Presence of stent in LAD coronary artery 04/07/2012   Xience Expedition DES 2.75 mm x 18 mm (dilated to 3.0 mm)   Sleep apnea    does not wear CPAP   Type II diabetes mellitus (HCC)    insulin dependent    Past Surgical History:  Procedure Laterality Date   ABDOMINAL HYSTERECTOMY  1990   BREAST EXCISIONAL BIOPSY Right    CARDIAC CATHETERIZATION  11/04/2008   patent RCA, LM, and Circ, nl EF   CARDIAC CATHETERIZATION  02/20/2002   patent coronaries with the only abnormality being a smooth luminla irregularity in the mid intermediate ramus branch no felt to be hemodynamically significant, nl LV   CARDIAC CATHETERIZATION N/A 02/05/2016   Procedure: Left Heart Cath and Coronary Angiography;  Surgeon: Leonie Man, MD;  Location: [facility];  Service: Cardiovascular;  Laterality: N/A;   CARDIAC CATHETERIZATION N/A 02/05/2016   Procedure: Coronary  Stent Intervention;  Surgeon: Leonie Man, MD;  Location: [facility];  Service: Cardiovascular;  Laterality: N/A;   CARDIOVERSION N/A 07/16/2021   Procedure: CARDIOVERSION;  Surgeon: Werner Lean, MD;  Location: [facility];  Service: Cardiovascular;  Laterality: N/A;   CATARACT EXTRACTION W/ INTRAOCULAR LENS  IMPLANT, BILATERAL  2013   CORONARY ANGIOPLASTY WITH STENT PLACEMENT  04/07/2012   Xience Expedition DES 2.64mm x 18 mm (dilated to 3.0 mm) to the prox LAD    CORONARY ANGIOPLASTY WITH STENT PLACEMENT  2013; 2015   CORONARY ARTERY BYPASS GRAFT N/A 08/17/2017   Procedure: CORONARY ARTERY BYPASS GRAFTING (CABG) TIMES 1 USING LEFT INTERNAL MAMMARY ARTERY;  Surgeon: Melrose Nakayama, MD;  Location: [facility];  Service: Open Heart Surgery;  Laterality: N/A;   DILATION AND CURETTAGE OF UTERUS  <1990   ESOPHAGOGASTRODUODENOSCOPY N/A 05/23/2015   Procedure: ESOPHAGOGASTRODUODENOSCOPY (EGD);  Surgeon: Carol Ada, MD;  Location: [facility];  Service: Endoscopy;  Laterality: N/A;   ESOPHAGOGASTRODUODENOSCOPY (EGD) WITH PROPOFOL N/A 06/13/2015   Procedure: ESOPHAGOGASTRODUODENOSCOPY (EGD) WITH PROPOFOL;  Surgeon: Carol Ada, MD;  [facility];  Service: Endoscopy;  Laterality: N/A;   ESOPHAGOGASTRODUODENOSCOPY (EGD) WITH PROPOFOL N/A 04/07/2018   Procedure: ESOPHAGOGASTRODUODENOSCOPY (EGD) WITH PROPOFOL;  Surgeon: Carol Ada, MD;  [facility];  Service: Endoscopy;  Laterality: N/A;  fluoroscopy is needed   FRACTURE SURGERY  HAMMER TOE SURGERY Bilateral 1993   with screws, on screw removed   JOINT REPLACEMENT     KNEE ARTHROSCOPY Bilateral    left x2, right x1   LAPAROSCOPIC CHOLECYSTECTOMY     LEFT HEART CATH AND CORONARY ANGIOGRAPHY N/A 08/11/2017   Procedure: LEFT HEART CATH AND CORONARY ANGIOGRAPHY;  Surgeon: Martinique, Peter M, MD;  Location: [facility];  Service: Cardiovascular;  Laterality: N/A;   LEFT HEART CATHETERIZATION WITH  CORONARY ANGIOGRAM N/A 04/07/2012   Procedure: LEFT HEART CATHETERIZATION WITH CORONARY ANGIOGRAM;  Surgeon: [name], MD;  Location: [facility] CATH LAB;  Service: Cardiovascular;  Laterality: N/A;   LEFT HEART CATHETERIZATION WITH CORONARY ANGIOGRAM N/A 05/27/2014   Procedure: LEFT HEART CATHETERIZATION WITH CORONARY ANGIOGRAM;  Surgeon: [name], MD; LAD 99% ISR, CFX 50-60%, RCA (dominant) no sig dz, EF 60%   LOWER EXTREMITY ANGIOGRAM  05/17/11   directional atherectomy to the prox L SFA using a LX Man TurboHawk, ballooned with a [place] balloon    ORIF ANKLE FRACTURE  07/09/2012   Procedure: OPEN REDUCTION INTERNAL FIXATION (ORIF) ANKLE FRACTURE;  Surgeon: Meredith Pel, MD;  [facility];  Service: Orthopedics;  Laterality: Left;  open reduction internal fixation trimalleolar ankle fracture medial malleolous fixation   PERCUTANEOUS CORONARY STENT INTERVENTION (PCI-S) N/A 04/07/2012   Procedure: PERCUTANEOUS CORONARY STENT INTERVENTION (PCI-S);  Surgeon: [name], MD;  Location: [facility] CATH LAB;  Service: Cardiovascular;  Laterality: N/A;   PERCUTANEOUS CORONARY STENT INTERVENTION (PCI-S)  05/27/2014   Procedure: PERCUTANEOUS CORONARY STENT INTERVENTION (PCI-S);  Surgeon: [name], MD; 3 mm x 12 mm long Xience Xpedition DES to the proximal LAD   SAVORY DILATION N/A 06/13/2015   Procedure: SAVORY DILATION;  Surgeon: Carol Ada, MD;  [facility];  Service: Endoscopy;  Laterality: N/A;   SAVORY DILATION N/A 04/07/2018   Procedure: SAVORY DILATION;  Surgeon: Carol Ada, MD;  [facility];  Service: Endoscopy;  Laterality: N/A;   TEE WITHOUT CARDIOVERSION N/A 08/17/2017   Procedure: TRANSESOPHAGEAL ECHOCARDIOGRAM (TEE);  Surgeon: Melrose Nakayama, MD;  Location: [facility];  Service: Open Heart Surgery;  Laterality: N/A;   TEE WITHOUT CARDIOVERSION N/A 07/16/2021   Procedure: TRANSESOPHAGEAL ECHOCARDIOGRAM (TEE);  Surgeon: Werner Lean,  MD;  Location: [facility] ENDOSCOPY;  Service: Cardiovascular;  Laterality: N/A;   TONSILLECTOMY     TOTAL KNEE ARTHROPLASTY Left 2003   TOTAL KNEE REVISION Left 11/05/2013   Procedure: LEFT TOTAL KNEE RESECTION;  Surgeon: Mauri Pole, MD;  [facility];  Service: Orthopedics;  Laterality: Left;   TOTAL KNEE REVISION Left 2007   opened in 2006 and cleaned out, 2007 revision   TOTAL KNEE REVISION Left 12/31/2013   Procedure: RE-INPLANTATION LEFT TOTAL KNEE ;  Surgeon: Mauri Pole, MD;  [facility];  Service: Orthopedics;  Laterality: Left;   VIDEO ASSISTED THORACOSCOPY (VATS)/ LOBECTOMY  01/2009    Family History  Problem Relation Age of Onset   Hypertension Mother    Coronary artery disease Brother    Hypertension Sister     Social History:  reports that she quit smoking about 38 years ago. Her smoking use included cigarettes. She has a 20.00 pack-year smoking history. She has never been exposed to tobacco smoke. She has never used smokeless tobacco. She reports that she does not drink alcohol and does not use drugs.  Allergies:  Allergies  Allergen Reactions   Levofloxacin Itching   Morphine And Related Itching  and Other (See Comments)    Pt reports oxycodone history with no allergy.    Medications: I have reviewed the patient's current medications.  Results for orders placed or performed during the hospital encounter of 10/01/22 (from the past 48 hour(s))  Glucose, capillary     Status: Abnormal   Collection Time: 10/03/22  3:41 PM  Result Value Ref Range   Glucose-Capillary 108 (H) 70 - 99 mg/dL    Comment: Glucose reference range applies only to samples taken after fasting for at least 8 hours.  Glucose, capillary     Status: None   Collection Time: 10/03/22  4:56 PM  Result Value Ref Range   Glucose-Capillary 98 70 - 99 mg/dL    Comment: Glucose reference range applies only to samples taken after fasting for at least 8 hours.  Glucose, capillary     Status: Abnormal    Collection Time: 10/03/22  8:36 PM  Result Value Ref Range   Glucose-Capillary 138 (H) 70 - 99 mg/dL    Comment: Glucose reference range applies only to samples taken after fasting for at least 8 hours.  Protime-INR     Status: Abnormal   Collection Time: 10/04/22 12:42 AM  Result Value Ref Range   Prothrombin Time 23.9 (H) 11.4 - 15.2 seconds   INR 2.2 (H) 0.8 - 1.2    Comment: (NOTE) INR goal varies based on device and disease states. Performed at [facility], [place] [address] [place], [place] [address]   Renal function panel     Status: Abnormal   Collection Time: 10/04/22 12:42 AM  Result Value Ref Range   Sodium 132 (L) 135 - 145 mmol/L   Potassium 3.6 3.5 - 5.1 mmol/L   Chloride 93 (L) 98 - 111 mmol/L   CO2 30 22 - 32 mmol/L   Glucose, Bld 128 (H) 70 - 99 mg/dL    Comment: Glucose reference range applies only to samples taken after fasting for at least 8 hours.   BUN 21 8 - 23 mg/dL   Creatinine, Ser 1.32 (H) 0.44 - 1.00 mg/dL   Calcium 8.7 (L) 8.9 - 10.3 mg/dL   Phosphorus 3.4 2.5 - 4.6 mg/dL   Albumin 2.4 (L) 3.5 - 5.0 g/dL   GFR, Estimated 43 (L) >60 mL/min    Comment: (NOTE) Calculated using the CKD-EPI Creatinine Equation (2021)    Anion gap 9 5 - 15    Comment: Performed at [facility] [address] [place], [place] [address]  Magnesium     Status: None   Collection Time: 10/04/22 12:42 AM  Result Value Ref Range   Magnesium 2.0 1.7 - 2.4 mg/dL    Comment: Performed at [facility] [address] [place], [place] [address]  Glucose, capillary     Status: Abnormal   Collection Time: 10/04/22  6:04 AM  Result Value Ref Range   Glucose-Capillary 147 (H) 70 - 99 mg/dL    Comment: Glucose reference range applies only to samples taken after fasting for at least 8 hours.  Glucose, capillary     Status: Abnormal   Collection Time: 10/04/22 11:29 AM  Result Value Ref Range   Glucose-Capillary 203 (H) 70 - 99 mg/dL    Comment: Glucose  reference range applies only to samples taken after fasting for at least 8 hours.  Glucose, capillary     Status: Abnormal   Collection Time: 10/04/22  4:12 PM  Result Value Ref  Range   Glucose-Capillary 124 (H) 70 - 99 mg/dL    Comment: Glucose reference range applies only to samples taken after fasting for at least 8 hours.  Glucose, capillary     Status: Abnormal   Collection Time: 10/04/22  9:18 PM  Result Value Ref Range   Glucose-Capillary 125 (H) 70 - 99 mg/dL    Comment: Glucose reference range applies only to samples taken after fasting for at least 8 hours.   Comment 1 Notify RN    Comment 2 Document in Chart   Protime-INR     Status: Abnormal   Collection Time: 10/05/22  1:09 AM  Result Value Ref Range   Prothrombin Time 22.2 (H) 11.4 - 15.2 seconds   INR 2.0 (H) 0.8 - 1.2    Comment: (NOTE) INR goal varies based on device and disease states. Performed at [facility], [place] [address], [place], [place] [address]   Basic metabolic panel     Status: Abnormal   Collection Time: 10/05/22  1:09 AM  Result Value Ref Range   Sodium 132 (L) 135 - 145 mmol/L   Potassium 3.7 3.5 - 5.1 mmol/L   Chloride 94 (L) 98 - 111 mmol/L   CO2 27 22 - 32 mmol/L   Glucose, Bld 168 (H) 70 - 99 mg/dL    Comment: Glucose reference range applies only to samples taken after fasting for at least 8 hours.   BUN 21 8 - 23 mg/dL   Creatinine, Ser 1.36 (H) 0.44 - 1.00 mg/dL   Calcium 8.9 8.9 - 10.3 mg/dL   GFR, Estimated 41 (L) >60 mL/min    Comment: (NOTE) Calculated using the CKD-EPI Creatinine Equation (2021)    Anion gap 11 5 - 15    Comment: Performed at [facility] [address], [place], [place] [address]  CBC     Status: Abnormal   Collection Time: 10/05/22  1:09 AM  Result Value Ref Range   WBC 9.8 4.0 - 10.5 K/uL   RBC 3.60 (L) 3.87 - 5.11 MIL/uL   Hemoglobin 10.4 (L) 12.0 - 15.0 g/dL   HCT 31.9 (L) 36.0 - 46.0 %   MCV 88.6 80.0 - 100.0 fL   MCH 28.9 26.0 - 34.0 pg    MCHC 32.6 30.0 - 36.0 g/dL   RDW 13.1 11.5 - 15.5 %   Platelets 305 150 - 400 K/uL   nRBC 0.0 0.0 - 0.2 %    Comment: Performed at [facility], [place] [address], [place], [place] [address]  Glucose, capillary     Status: Abnormal   Collection Time: 10/05/22  6:22 AM  Result Value Ref Range   Glucose-Capillary 138 (H) 70 - 99 mg/dL    Comment: Glucose reference range applies only to samples taken after fasting for at least 8 hours.   Comment 1 Notify RN    Comment 2 Document in Chart   Glucose, capillary     Status: Abnormal   Collection Time: 10/05/22 11:20 AM  Result Value Ref Range   Glucose-Capillary 193 (H) 70 - 99 mg/dL    Comment: Glucose reference range applies only to samples taken after fasting for at least 8 hours.    DG Knee Complete 4 Views Right  Result Date: 10/04/2022 CLINICAL DATA:  Right knee pain. EXAM: RIGHT KNEE - COMPLETE 4+ VIEW COMPARISON:  Right knee radiographs 09/01/2022 FINDINGS: Severe medial and patellofemoral and moderate lateral compartment joint space narrowing, subchondral  sclerosis, and peripheral osteophytosis. No joint effusion. No acute fracture or dislocation. Moderate vascular calcifications. IMPRESSION: Severe medial and patellofemoral and moderate lateral compartment osteoarthritis. Electronically Signed   By: Yvonne Kendall M.D.   On: 10/04/2022 18:54    Review of Systems  HENT:  Negative for ear discharge, ear pain, hearing loss and tinnitus.   Eyes:  Negative for photophobia and pain.  Respiratory:  Negative for cough and shortness of breath.   Cardiovascular:  Negative for chest pain.  Gastrointestinal:  Negative for abdominal pain, nausea and vomiting.  Genitourinary:  Negative for dysuria, flank pain, frequency and urgency.  Musculoskeletal:  Positive for arthralgias (Right knee). Negative for back pain, myalgias and neck pain.  Neurological:  Negative for dizziness and headaches.  Hematological:  Does not bruise/bleed easily.   Psychiatric/Behavioral:  The patient is not nervous/anxious.    Blood pressure (!) 129/52, pulse 100, temperature 98 F (36.7 C), temperature source Oral, resp. rate 18, height 5\' 3"  (1.6 m), weight 127.6 kg, SpO2 94 %. Physical Exam Constitutional:      General: She is not in acute distress.    Appearance: She is well-developed. She is not diaphoretic.  HENT:     Head: Normocephalic and atraumatic.  Eyes:     General: No scleral icterus.       Right eye: No discharge.        Left eye: No discharge.     Conjunctiva/sclera: Conjunctivae normal.  Cardiovascular:     Rate and Rhythm: Normal rate and regular rhythm.  Pulmonary:     Effort: Pulmonary effort is normal. No respiratory distress.  Musculoskeletal:     Cervical back: Normal range of motion.     Comments: RLE No traumatic wounds, ecchymosis, or rash  Mod TTP diffuse, mod pain with PROM  No knee or ankle effusion  Sens DPN, SPN, TN intact  Motor EHL, ext, flex, evers 5/5  No significant edema  Skin:    General: Skin is warm and dry.  Neurological:     Mental Status: She is alert.  Psychiatric:        Mood and Affect: Mood normal.        Behavior: Behavior normal.     Assessment/Plan: Right knee OA -- Injected knee with 23ml 0.5% Marcaine and 40mg  depomedrol. F/u with Dr. Marlou Sa as directed. If going to be here another couple of days would recommend MRI right femur without contrast as suggested by Dr. Marlou Sa at office visit earlier in December. I will order but if ready for discharge sooner do not need to delay discharge.    Lisette Abu, PA-C Orthopedic Surgery [phone] 10/05/2022, 3:39 PM

## 2022-10-06 ENCOUNTER — Encounter (HOSPITAL_BASED_OUTPATIENT_CLINIC_OR_DEPARTMENT_OTHER): Payer: HMO | Admitting: Physician Assistant

## 2022-10-06 DIAGNOSIS — I5032 Chronic diastolic (congestive) heart failure: Secondary | ICD-10-CM | POA: Diagnosis not present

## 2022-10-06 DIAGNOSIS — E1169 Type 2 diabetes mellitus with other specified complication: Secondary | ICD-10-CM

## 2022-10-06 DIAGNOSIS — E785 Hyperlipidemia, unspecified: Secondary | ICD-10-CM

## 2022-10-06 DIAGNOSIS — M1711 Unilateral primary osteoarthritis, right knee: Secondary | ICD-10-CM | POA: Diagnosis present

## 2022-10-06 DIAGNOSIS — I739 Peripheral vascular disease, unspecified: Secondary | ICD-10-CM | POA: Diagnosis not present

## 2022-10-06 DIAGNOSIS — E66813 Obesity, class 3: Secondary | ICD-10-CM | POA: Diagnosis present

## 2022-10-06 DIAGNOSIS — I10 Essential (primary) hypertension: Secondary | ICD-10-CM

## 2022-10-06 LAB — BASIC METABOLIC PANEL
Anion gap: 9 (ref 5–15)
BUN: 21 mg/dL (ref 8–23)
CO2: 26 mmol/L (ref 22–32)
Calcium: 9.2 mg/dL (ref 8.9–10.3)
Chloride: 95 mmol/L — ABNORMAL LOW (ref 98–111)
Creatinine, Ser: 1.31 mg/dL — ABNORMAL HIGH (ref 0.44–1.00)
GFR, Estimated: 43 mL/min — ABNORMAL LOW (ref >60)
Glucose, Bld: 188 mg/dL — ABNORMAL HIGH (ref 70–99)
Potassium: 4.5 mmol/L (ref 3.5–5.1)
Sodium: 130 mmol/L — ABNORMAL LOW (ref 135–145)

## 2022-10-06 LAB — CBC
HCT: 31 % — ABNORMAL LOW (ref 36.0–46.0)
Hemoglobin: 10 g/dL — ABNORMAL LOW (ref 12.0–15.0)
MCH: 28.8 pg (ref 26.0–34.0)
MCHC: 32.3 g/dL (ref 30.0–36.0)
MCV: 89.3 fL (ref 80.0–100.0)
Platelets: 311 10*3/uL (ref 150–400)
RBC: 3.47 MIL/uL — ABNORMAL LOW (ref 3.87–5.11)
RDW: 13.2 % (ref 11.5–15.5)
WBC: 10 10*3/uL (ref 4.0–10.5)
nRBC: 0 % (ref 0.0–0.2)

## 2022-10-06 LAB — GLUCOSE, CAPILLARY
Glucose-Capillary: 168 mg/dL — ABNORMAL HIGH (ref 70–99)
Glucose-Capillary: 207 mg/dL — ABNORMAL HIGH (ref 70–99)
Glucose-Capillary: 142 mg/dL — ABNORMAL HIGH (ref 70–99)
Glucose-Capillary: 152 mg/dL — ABNORMAL HIGH (ref 70–99)

## 2022-10-06 LAB — PROTIME-INR
INR: 1.7 — ABNORMAL HIGH (ref 0.8–1.2)
Prothrombin Time: 19.4 seconds — ABNORMAL HIGH (ref 11.4–15.2)

## 2022-10-06 MED ORDER — WARFARIN SODIUM 3 MG PO TABS
6.0000 mg | ORAL_TABLET | Freq: Once | ORAL | Status: AC
  Administered 2022-10-06: 6 mg via ORAL
  Filled 2022-10-06: qty 2

## 2022-10-06 NOTE — Assessment & Plan Note (Addendum)
Hyponatremia.   Her renal function remained stable, at the time of  her discharge her serum cr is 1,31 with K at 4,5 and serum bicarbonate at 26. NA 130  Plan to continue diuresis with furosemide and follow up renal function as outpatient.

## 2022-10-06 NOTE — Assessment & Plan Note (Signed)
Calculated BMI is 50,3

## 2022-10-06 NOTE — Assessment & Plan Note (Addendum)
DJD.  Radiograph with severe tricompartmental osteoarthritis.  Orthopedics consulted and now patient is sp local injection to right knee with improvement of her symptoms.   Follow up right femur MRI with acute torn and moderately retracted right distal ilipsoas tendon.  Moderate subcutaneus edema in the right thigh especially medially without drainable abscess. There is atrophy and low grade edema in the right musculature.  Osteoarthritis of the right knee with a small right knee effusion.  No acute bony findings.   Continue to have difficulty with mobility, plan to continue PT and Ot at SNF.  Patient is medically stable.   For her left rib fracture, pain remained well controlled, plan to continue with physical therapy.

## 2022-10-06 NOTE — Progress Notes (Signed)
   10/06/22 1600  Mobility  Activity Stood at bedside  Level of Assistance Maximum assist, patient does 25-49% (+2)  Assistive Device Stedy  Activity Response Tolerated fair  Mobility Referral Yes  $Mobility charge 1 Mobility   Mobility Specialist Progress Note  Pt was in bed and agreeable. Had trouble standing d/t fatigue and c/o of R knee pain. Returned to bed w/ all needs met and call bell in reach.  Lucious Groves Mobility Specialist  Please contact via SecureChat or Rehab office at [phone]

## 2022-10-06 NOTE — TOC Progression Note (Signed)
Transition of Care [facility]) - Progression Note    Patient Details  Name: [name] MRN: [id number] Date of Birth: [birth year]  Transition of Care [facility]) CM/SW [place], LCSW Phone Number: 10/06/2022, 3:46 PM  Clinical Narrative:    CSW called [employer] to start insurance auth for pt to dc to SNF. CSW unable to speak with anyone and left VM.    Expected Discharge Plan: [place] Barriers to Discharge: Continued Medical Work up  Expected Discharge Plan and Services In-house Referral: Clinical Social Work     Living arrangements for the past 2 months: Single Family Home                                       Social Determinants of Health (SDOH) Interventions [place]: No Food Insecurity (10/02/2022)  Housing: Low Risk  (10/02/2022)  Transportation Needs: No Transportation Needs (10/02/2022)  Utilities: Not At Risk (10/02/2022)  Depression (PHQ2-9): Low Risk  (05/07/2020)  Stress: Stress Concern Present (11/14/2019)  Tobacco Use: Medium Risk (10/01/2022)    Readmission Risk Interventions     No data to display         Beckey Rutter, MSW, LCSWA, LCASA Transitions of Care  Clinical Social Worker I

## 2022-10-06 NOTE — Hospital Course (Addendum)
[name] was admitted to the hospital with the working diagnosis of right knee osteoarthritis with recurrent falls.   72 year old African-American female past medical history significant for lung ca s/p surgery,hx of  CAD s/p stent LAD, HLD,Paroxysmal atrial fibrillation on warfarin ,  OSA does not wear cpap, CHFpef, PAD with chronic venostasis with b/l lower extremity ulcerations on calves followed by vascular and wound center, type 2 diabetes on insulin,  severe djd of b/l knees s/p TKR on Left,and morbid obesity who has had progressive debility, worsening right leg and knee pain with recurrent falls at home leading to multiple ED visits for falls. Chronic wounds of left leg. Patient not able to perform further activities of daily living.  On her initial physical examination her blood pressure was 148/72, HR 115, RR 20 and 02 saturation 100%. Lungs with no wheezing or rales, heart with S1 and S2 present and rhythmic, abdomen with no distention, on lower extremity edema.   Na 132, K 4,2 CL 96 bicarbonate 25, glucose 266, bun 16 cr 1,19  BNP 174  Wbc 13,3 hgb 11,8 plt 278  INR 4, 2  Urine analysis SG 1,009, >500 glucose, negative protein, no leukocytes.   Head and cervical spine with no acute changes.   Right hip radiograph with no acute fractures  Bilateral shoulders radiograph with no acute fractures. \ Left ribs radiographs with displaced fracture of the left sixth rib posteriorly. No obvious pneumothorax on these limited supine radiographs.   Sars covid 19 negative   EKG 97 bpm, normal axis, right bundle branch block, qtc 520, sinus rhythm with no significant ST segment or T wave changes.   Started on antibiotics for possible cellulitis -Noted to have increased right knee pain, significantly limited range of motion  Orthopedics was consulted and patient had local marcaine and depomedrol injection with improvement of her symptoms.   Patient very weak and deconditioned, she is  being discharge to SNF to continue physical therapy.

## 2022-10-06 NOTE — Assessment & Plan Note (Addendum)
Continue insulin therapy with 70/30 Her glucose remained well controlled during her hospitalization.

## 2022-10-06 NOTE — Progress Notes (Signed)
ANTICOAGULATION CONSULT NOTE - Initial Consult  Pharmacy Consult for warfarin  Indication: atrial fibrillation  Allergies  Allergen Reactions   Levofloxacin Itching   Morphine And Related Itching and Other (See Comments)    Pt reports oxycodone history with no allergy.    Vital Signs: Temp: 98.4 F (36.9 C) (01/10 0304) Temp Source: Oral (01/10 0304) BP: 153/58 (01/10 0304) Pulse Rate: 111 (01/10 0304)  Labs: Recent Labs    10/04/22 0042 10/05/22 0109 10/06/22 0036  HGB  --  10.4* 10.0*  HCT  --  31.9* 31.0*  PLT  --  305 311  LABPROT 23.9* 22.2* 19.4*  INR 2.2* 2.0* 1.7*  CREATININE 1.32* 1.36* 1.31*     Estimated Creatinine Clearance: 50.9 mL/min (A) (by C-G formula based on SCr of 1.31 mg/dL (H)).   Medical History: Past Medical History:  Diagnosis Date   Arthritis    "knees" (02/05/2016)   Cancer ([place]) 5/10   LUL Vats    CHF (congestive heart failure) (HCC)    Coronary artery disease    GERD (gastroesophageal reflux disease)    tx meds   H/O hiatal hernia    History of blood transfusion "several"   last april 2015 s/p knee surgery (02/05/2016)   Hypercholesteremia    Hypertension    Lung cancer ([place])    2010, surgery 18% left lung no radiation or chemo   Non-STEMI (non-ST elevated myocardial infarction) ([place]) 04/07/2012   See cath results below   Paroxysmal atrial fibrillation ([place]) 04/07/2012   On Warfarin   Peripheral vascular disease ([place]) 8/12   Lt SFA PTA   Presence of stent in LAD coronary artery 04/07/2012   Xience Expedition DES 2.75 mm x 18 mm (dilated to 3.0 mm)   Sleep apnea    does not wear CPAP   Type II diabetes mellitus (HCC)    insulin dependent   Assessment: Patient admitted with CC of knee pain and recurring falls. History of afib on warfarin. PTA patient was taking warfarin 7.5mg  daily except 1/2 tablet on M/W/F. Admit INR up to 4.6.   INR trended down to 2 today. Warfarin held 1/8 for steroid knee injection by ortho. Continue  to hold 1/9 per MD. OK to restart on 1/10 per team. INR down to 1.7 today. CBC stable. Eating 50-100% of meals.  Goal of Therapy:  INR 2-3 Monitor platelets by anticoagulation protocol: Yes   Plan:  Warfarin 6 mg x1   Monitor daily INR, weekly CBC Monitor for signs/symptoms of bleeding    Benetta Spar, PharmD, BCPS, BCCP Clinical Pharmacist  Please check AMION for all [place] phone numbers After 10:00 PM, call [place] [phone]

## 2022-10-06 NOTE — Assessment & Plan Note (Addendum)
Echocardiogram with preserved LV systolic function. Clinically euvolemic.  Plan to continue diuresis with furosemide.

## 2022-10-06 NOTE — Assessment & Plan Note (Signed)
Continue blood pressure control and statins . Superficial wounds with dressing in place.  Follow up with wound care.

## 2022-10-06 NOTE — Assessment & Plan Note (Addendum)
Rate control with metoprolol, continue anticoagulation with warfarin.  Discharge INR is 1.4

## 2022-10-06 NOTE — Progress Notes (Addendum)
  Progress Note   Patient: [name] YQM:[id number] DOB: [birth year] DOA: 10/01/2022     5 DOS: the patient was seen and examined on 10/06/2022   Brief hospital course: [name] was admitted to the hospital with the working diagnosis of right knee osteoarthritis with recurrent falls.   Patient is a 72 year old African-American female past medical history significant for lung ca s/p surgery,hx of  CAD s/p stent LAD, HLD,Paroxysmal atrial fibrillation on warfarin ,  OSA does not wear cpap, CHFpef, PAD with chronic venostasis with b/l lower extremity ulcerations on calves followed by vascular and wound center, type 2 diabetes on insulin,  severe djd of b/l knees s/p TKR on Left,and morbid obesity who has had progressive debility, worsening right leg and knee pain with recurrent falls at home leading to multiple ED visits for falls.chronic wounds of left leg, started on antibiotics for possible cellulitis -Noted to have increased right knee pain, significantly limited range of motion -d/w Ortho abt intra-articular steroids, concern with Coumadin, they will get back to [place]  Assessment and Plan: * Chronic diastolic CHF (congestive heart failure) (HCC) Echocardiogram with preserved LV systolic function. Clinically euvolemic.  Plan to continue diuresis with furosemide.   Arthritis of right knee DJD.  Radiograph with severe tricompartmental osteoarthritis.  Orthopedics consulted and now patient is sp local injection to right knee with improvement of her symptoms.  Continue to have difficulty with mobility, plan to continue PT and Ot at SNF.  Patient is medically stable.   PVD (peripheral vascular disease), Lt SFA PTA Aug 2012 Continue blood pressure control and statins . Superficial wounds with dressing in place.  Follow up with wound care.   Type 2 diabetes mellitus with hyperlipidemia (HCC) Continue insulin therapy with 70/30 Insulin sliding scale for glucose cover and monitoring   Patient is tolerating po well.   Essential hypertension Continue blood pressure control with metoprolol.   Paroxysmal atrial fibrillation (HCC) Rate control with metoprolol and will resume anticoagulation with warfarin Follow up INR per pharmacy protocol.   CKD (chronic kidney disease) stage 3, GFR 30-59 ml/min (HCC) No active chest pain, continue with metoprolol and statin therapy.   Class 3 obesity (HCC) Calculated BMI is 50,3        Subjective: Patient with improvement in right knee pain, continue very debilitated and not back to her baseline   Physical Exam: Vitals:   10/05/22 1918 10/06/22 0304 10/06/22 0729 10/06/22 1211  BP: (!) 144/66 (!) 153/58 (!) 148/69 (!) 141/90  Pulse: (!) 111 (!) 111 100 (!) 102  Resp: 18 18 19 18   Temp: 98.3 F (36.8 C) 98.4 F (36.9 C) 98.3 F (36.8 C) 98.3 F (36.8 C)  TempSrc: Oral Oral Oral Oral  SpO2: 98%  96% 97%  Weight:  128.8 kg    Height:       Neurology awake and alert ENT with mild pallor Cardiovascular with S1 and S2 present with no gallops Respiratory with no wheezing Abdomen with no distention  Lower extremity with wraps in place, non pitting edema  Data Reviewed:    Family Communication: no family at the bedside   Disposition: Status is: Inpatient Remains inpatient appropriate because: pending transfer to SNF   Planned Discharge Destination: Skilled nursing facility     Author: Tawni Millers, MD 10/06/2022 2:22 PM  For on call review [email or web address].

## 2022-10-06 NOTE — Assessment & Plan Note (Signed)
Continue blood pressure control with metoprolol.  

## 2022-10-07 DIAGNOSIS — I739 Peripheral vascular disease, unspecified: Secondary | ICD-10-CM | POA: Diagnosis not present

## 2022-10-07 DIAGNOSIS — I5032 Chronic diastolic (congestive) heart failure: Secondary | ICD-10-CM | POA: Diagnosis not present

## 2022-10-07 DIAGNOSIS — M1711 Unilateral primary osteoarthritis, right knee: Secondary | ICD-10-CM | POA: Diagnosis not present

## 2022-10-07 DIAGNOSIS — E1169 Type 2 diabetes mellitus with other specified complication: Secondary | ICD-10-CM | POA: Diagnosis not present

## 2022-10-07 LAB — GLUCOSE, CAPILLARY
Glucose-Capillary: 128 mg/dL — ABNORMAL HIGH (ref 70–99)
Glucose-Capillary: 177 mg/dL — ABNORMAL HIGH (ref 70–99)
Glucose-Capillary: 254 mg/dL — ABNORMAL HIGH (ref 70–99)
Glucose-Capillary: 184 mg/dL — ABNORMAL HIGH (ref 70–99)

## 2022-10-07 LAB — CULTURE, BLOOD (ROUTINE X 2)
Culture: NO GROWTH
Culture: NO GROWTH

## 2022-10-07 LAB — PROTIME-INR
INR: 1.5 — ABNORMAL HIGH (ref 0.8–1.2)
Prothrombin Time: 17.6 seconds — ABNORMAL HIGH (ref 11.4–15.2)

## 2022-10-07 MED ORDER — WARFARIN SODIUM 7.5 MG PO TABS
7.5000 mg | ORAL_TABLET | Freq: Once | ORAL | Status: AC
  Administered 2022-10-07: 7.5 mg via ORAL
  Filled 2022-10-07: qty 1

## 2022-10-07 NOTE — Progress Notes (Signed)
Occupational Therapy Treatment Patient Details Name: [name] MRN: [id number] DOB: March 22, 1951 Today's Date: 10/07/2022   History of present illness Pt is a 72 y.o. female who presented 10/01/22 with weakness and multiple falls. Imaging revealed a displaced fracture of the left sixth rib posteriorly. PMH: lung ca s/p surgery, hx of CAD s/p stent LAD, HLD, paroxysmal afib on warfarin, OSA, CHF, PAD with chronic venostasis with b/l lower extremity ulcerations on calves, DM2, obesity, severe DJD of bil knees s/p TKR on left, non-STEMI   OT comments  Planned to progress OOB activity with use of Stedy. However, pt with bowel incontinence noted on entry and reports unaware of this. Pt requires Max A x 2 for rolling side to side for Total A cleanup though reported need for continued BM. Ultimately unable to attempt EOB/OOB d/t incontinence today. Will continue to follow acutely and progress independence within tolerance. Continue to rec SNF rehab as pt requires significant assist for completion of ADLs/transfers.   Recommendations for follow up therapy are one component of a multi-disciplinary discharge planning process, led by the attending physician.  Recommendations may be updated based on patient status, additional functional criteria and insurance authorization.    Follow Up Recommendations  Skilled nursing-short term rehab (<3 hours/day)     Assistance Recommended at Discharge Frequent or constant Supervision/Assistance  Patient can return home with the following  Two people to help with bathing/dressing/bathroom;Assistance with cooking/housework;Assist for transportation;Help with stairs or ramp for entrance;Two people to help with walking and/or transfers   Equipment Recommendations  Wheelchair (measurements OT);Wheelchair cushion (measurements OT);Hospital bed    Recommendations for Other Services      Precautions / Restrictions Precautions Precautions: Fall;Other  (comment) Precaution Comments: watch HR; bil calf wounds Restrictions Weight Bearing Restrictions: No       Mobility Bed Mobility Overal bed mobility: Needs Assistance Bed Mobility: Rolling Rolling: Max assist, +2 for physical assistance, +2 for safety/equipment         General bed mobility comments: Assist to bend B knees, pt able to reach to bedrail though requires Max A x 2 to successfully roll hips/LEs    Transfers                   General transfer comment: unable to attempt due to bowel incontinence     Balance                                           ADL either performed or assessed with clinical judgement   ADL Overall ADL's : Needs assistance/impaired                             Toileting- Clothing Manipulation and Hygiene: Total assistance;Bed level;+2 for physical assistance;+2 for safety/equipment Toileting - Clothing Manipulation Details (indicate cue type and reason): +2 to roll in bed for initial cleanup attempts with pt reporting need to continue w/ BM. Unable to successfully place bedpan but able to place clean pad under pt to maximize skin integrity - RN and NT aware that pt instructed to call out when she feels she is ready to be fully cleaned up            Extremity/Trunk Assessment Upper Extremity Assessment Upper Extremity Assessment: Generalized weakness   Lower Extremity Assessment Lower Extremity Assessment: Defer to PT evaluation  Vision   Vision Assessment?: No apparent visual deficits   Perception     Praxis      Cognition Arousal/Alertness: Awake/alert Behavior During Therapy: WFL for tasks assessed/performed, Anxious Overall Cognitive Status: No family/caregiver present to determine baseline cognitive functioning                                 General Comments: pleasant, some inconsistent statements w/ pt unaware of BM in bed on entry with inconsistent reports of  continence (later reported she sat in this overnight and did not call out for assistance). follows directions consistently for clean up        Exercises      Shoulder Instructions       General Comments HR low 100s    Pertinent Vitals/ Pain       Pain Assessment Pain Assessment: Faces Faces Pain Scale: Hurts little more Pain Location: right knee with ROM Pain Descriptors / Indicators: Grimacing, Guarding Pain Intervention(s): Monitored during session, Limited activity within patient's tolerance  Home Living                                          Prior Functioning/Environment              Frequency  Min 2X/week        Progress Toward Goals  OT Goals(current goals can now be found in the care plan section)  Progress towards OT goals: OT to reassess next treatment  Acute Rehab OT Goals Patient Stated Goal: decrease knee pain OT Goal Formulation: With patient Time For Goal Achievement: 10/18/22 Potential to Achieve Goals: Good ADL Goals Pt Will Perform Upper Body Dressing: with min assist;sitting Pt Will Perform Lower Body Dressing: with mod assist;sitting/lateral leans;bed level;with adaptive equipment Pt Will Transfer to Toilet: with mod assist;with +2 assist;squat pivot transfer;stand pivot transfer;bedside commode Pt Will Perform Tub/Shower Transfer: Tub transfer;Shower transfer;with mod assist;ambulating;shower seat;rolling walker  Plan Discharge plan remains appropriate    Co-evaluation                 AM-PAC OT "6 Clicks" Daily Activity     Outcome Measure   Help from another person eating meals?: None Help from another person taking care of personal grooming?: A Little Help from another person toileting, which includes using toliet, bedpan, or urinal?: Total Help from another person bathing (including washing, rinsing, drying)?: A Lot Help from another person to put on and taking off regular upper body clothing?: A  Lot Help from another person to put on and taking off regular lower body clothing?: Total 6 Click Score: 13    End of Session    OT Visit Diagnosis: Unsteadiness on feet (R26.81);Other abnormalities of gait and mobility (R26.89);Muscle weakness (generalized) (M62.81)   Activity Tolerance Treatment limited secondary to medical complications (Comment)   Patient Left in bed;with call bell/phone within reach   Nurse Communication Mobility status;Need for lift equipment        Time: [phone] OT Time Calculation (min): 23 min  Charges: OT General Charges $OT Visit: 1 Visit OT Treatments $Self Care/Home Management : 8-22 mins $Therapeutic Activity: 8-22 mins  Malachy Chamber, OTR/L Acute Rehab Services Office: [phone]   Layla Maw 10/07/2022, 10:10 AM

## 2022-10-07 NOTE — Progress Notes (Signed)
ANTICOAGULATION CONSULT NOTE - Initial Consult  Pharmacy Consult for warfarin  Indication: atrial fibrillation  Allergies  Allergen Reactions   Levofloxacin Itching   Morphine And Related Itching and Other (See Comments)    Pt reports oxycodone history with no allergy.    Vital Signs: Temp: 97.6 F (36.4 C) (01/11 0437) Temp Source: Oral (01/11 0437) BP: 147/64 (01/11 0437) Pulse Rate: 101 (01/11 0437)  Labs: Recent Labs    10/05/22 0109 10/06/22 0036 10/07/22 0206  HGB 10.4* 10.0*  --   HCT 31.9* 31.0*  --   PLT 305 311  --   LABPROT 22.2* 19.4* 17.6*  INR 2.0* 1.7* 1.5*  CREATININE 1.36* 1.31*  --     Estimated Creatinine Clearance: 49.4 mL/min (A) (by C-G formula based on SCr of 1.31 mg/dL (H)).   Medical History: Past Medical History:  Diagnosis Date   Arthritis    "knees" (02/05/2016)   Cancer ([place]) 5/10   [facility]    CHF (congestive heart failure) (HCC)    Coronary artery disease    GERD (gastroesophageal reflux disease)    tx meds   H/O hiatal hernia    History of blood transfusion "several"   last april 2015 s/p knee surgery (02/05/2016)   Hypercholesteremia    Hypertension    Lung cancer ([place])    2010, surgery 18% left lung no radiation or chemo   Non-STEMI (non-ST elevated myocardial infarction) ([place]) 04/07/2012   See cath results below   Paroxysmal atrial fibrillation ([place]) 04/07/2012   On Warfarin   Peripheral vascular disease ([place]) 8/12   Lt SFA PTA   Presence of stent in LAD coronary artery 04/07/2012   Xience Expedition DES 2.75 mm x 18 mm (dilated to 3.0 mm)   Sleep apnea    does not wear CPAP   Type II diabetes mellitus (HCC)    insulin dependent   Assessment: Patient admitted with CC of knee pain and recurring falls. History of afib on warfarin. PTA patient was taking warfarin 7.5mg  TuThSaSu and 3.75mg  on M/W/F. Admit INR up to 4.6.   INR trended down to 1.5 today.  CBC stable. Eating 50-100% of meals. Warfarin held 1/8-1/9  for  steroid knee injection by ortho, restarted 1/10.   Goal of Therapy:  INR 2-3 Monitor platelets by anticoagulation protocol: Yes   Plan:  Warfarin 7.5 mg x1   Monitor daily INR, weekly CBC Monitor for signs/symptoms of bleeding    Benetta Spar, PharmD, BCPS, BCCP Clinical Pharmacist  Please check AMION for all [place] phone numbers After 10:00 PM, call [place] [phone]

## 2022-10-07 NOTE — TOC Progression Note (Signed)
Transition of Care [facility]) - Progression Note    Patient Details  Name: [name] MRN: [id number] Date of Birth: [birth year]  Transition of Care [facility]) CM/SW [place], LCSW Phone Number: 10/07/2022, 12:13 PM  Clinical Narrative:    CSW spoke with [employer] to start insurance auth for pt to dc to [facility]. [employer] mentioned they would possibly be able to call CSW back this afternoon with insurance auth. Pt will dc to SNF once insurance auth is complete. TOC will continue to follow.    Expected Discharge Plan: [place] Barriers to Discharge: Continued Medical Work up  Expected Discharge Plan and Services In-house Referral: Clinical Social Work     Living arrangements for the past 2 months: Single Family Home                                       Social Determinants of Health (SDOH) Interventions [place]: No Food Insecurity (10/02/2022)  Housing: Low Risk  (10/02/2022)  Transportation Needs: No Transportation Needs (10/02/2022)  Utilities: Not At Risk (10/02/2022)  Depression (PHQ2-9): Low Risk  (05/07/2020)  Stress: Stress Concern Present (11/14/2019)  Tobacco Use: Medium Risk (10/01/2022)    Readmission Risk Interventions     No data to display         Beckey Rutter, MSW, LCSWA, LCASA Transitions of Care  Clinical Social Worker I

## 2022-10-07 NOTE — Progress Notes (Signed)
  Progress Note   Patient: [name] HDQ:[id number] DOB: 01/04/1951 DOA: 10/01/2022     6 DOS: the patient was seen and examined on 10/07/2022   Brief hospital course: Mrs. Rund was admitted to the hospital with the working diagnosis of right knee osteoarthritis with recurrent falls.   Patient is a 72 year old African-American female past medical history significant for lung ca s/p surgery,hx of  CAD s/p stent LAD, HLD,Paroxysmal atrial fibrillation on warfarin ,  OSA does not wear cpap, CHFpef, PAD with chronic venostasis with b/l lower extremity ulcerations on calves followed by vascular and wound center, type 2 diabetes on insulin,  severe djd of b/l knees s/p TKR on Left,and morbid obesity who has had progressive debility, worsening right leg and knee pain with recurrent falls at home leading to multiple ED visits for falls.chronic wounds of left leg, started on antibiotics for possible cellulitis -Noted to have increased right knee pain, significantly limited range of motion -d/w Ortho abt intra-articular steroids, concern with Coumadin, they will get back to [place]  Assessment and Plan: * Chronic diastolic CHF (congestive heart failure) (HCC) Echocardiogram with preserved LV systolic function. Clinically euvolemic.  Plan to continue diuresis with furosemide.   Arthritis of right knee DJD.  Radiograph with severe tricompartmental osteoarthritis.  Orthopedics consulted and now patient is sp local injection to right knee with improvement of her symptoms.  Continue to have difficulty with mobility, plan to continue PT and Ot at SNF.  Patient is medically stable.   PVD (peripheral vascular disease), Lt SFA PTA Aug 2012 Continue blood pressure control and statins . Superficial wounds with dressing in place.  Follow up with wound care.   Type 2 diabetes mellitus with hyperlipidemia (HCC) Continue insulin therapy with 70/30 Insulin sliding scale for glucose cover and monitoring   Patient is tolerating po well.   Essential hypertension Continue blood pressure control with metoprolol.   Paroxysmal atrial fibrillation (HCC) Rate control with metoprolol and will resume anticoagulation with warfarin Follow up INR per pharmacy protocol.   CKD (chronic kidney disease) stage 3, GFR 30-59 ml/min (HCC) No active chest pain, continue with metoprolol and statin therapy.   Class 3 obesity (HCC) Calculated BMI is 50,3        Subjective: knee pain has been stable, at this point waiting for insurance authorization   Physical Exam: Vitals:   10/07/22 0437 10/07/22 0845 10/07/22 1128 10/07/22 1652  BP: (!) 147/64 (!) 136/58 (!) 144/63 138/64  Pulse: (!) 101  98 97  Resp: 18 18 18 18   Temp: 97.6 F (36.4 C) 98.7 F (37.1 C) 98.1 F (36.7 C) 98.3 F (36.8 C)  TempSrc: Oral Oral Oral Oral  SpO2: 98% 96% 99% 99%  Weight: 123 kg     Height:       Neurology awake and alert ENT with mild pallor Cardiovascular with S1 and S2 present and rhythmic Respiratory with no rales or wheezing Abdomen with no distention Lower extremities with non pitting edema  Data Reviewed:    Family Communication: no family at the bedside   Disposition: Status is: Inpatient Remains inpatient appropriate because: pending insurance authorization   Planned Discharge Destination: Skilled nursing facility      Author: Tawni Millers, MD 10/07/2022 5:34 PM  For on call review [email or web address].

## 2022-10-08 DIAGNOSIS — Z7401 Bed confinement status: Secondary | ICD-10-CM | POA: Diagnosis not present

## 2022-10-08 DIAGNOSIS — I5022 Chronic systolic (congestive) heart failure: Secondary | ICD-10-CM | POA: Diagnosis not present

## 2022-10-08 DIAGNOSIS — M1711 Unilateral primary osteoarthritis, right knee: Secondary | ICD-10-CM | POA: Diagnosis not present

## 2022-10-08 DIAGNOSIS — I10 Essential (primary) hypertension: Secondary | ICD-10-CM | POA: Diagnosis not present

## 2022-10-08 DIAGNOSIS — N1831 Chronic kidney disease, stage 3a: Secondary | ICD-10-CM

## 2022-10-08 DIAGNOSIS — R2689 Other abnormalities of gait and mobility: Secondary | ICD-10-CM | POA: Diagnosis not present

## 2022-10-08 DIAGNOSIS — L89612 Pressure ulcer of right heel, stage 2: Secondary | ICD-10-CM | POA: Diagnosis not present

## 2022-10-08 DIAGNOSIS — J449 Chronic obstructive pulmonary disease, unspecified: Secondary | ICD-10-CM | POA: Diagnosis not present

## 2022-10-08 DIAGNOSIS — I959 Hypotension, unspecified: Secondary | ICD-10-CM | POA: Diagnosis not present

## 2022-10-08 DIAGNOSIS — E785 Hyperlipidemia, unspecified: Secondary | ICD-10-CM | POA: Diagnosis not present

## 2022-10-08 DIAGNOSIS — E1169 Type 2 diabetes mellitus with other specified complication: Secondary | ICD-10-CM | POA: Diagnosis not present

## 2022-10-08 DIAGNOSIS — I480 Paroxysmal atrial fibrillation: Secondary | ICD-10-CM | POA: Diagnosis not present

## 2022-10-08 DIAGNOSIS — Z9181 History of falling: Secondary | ICD-10-CM | POA: Diagnosis not present

## 2022-10-08 DIAGNOSIS — I5032 Chronic diastolic (congestive) heart failure: Secondary | ICD-10-CM | POA: Diagnosis not present

## 2022-10-08 DIAGNOSIS — I5023 Acute on chronic systolic (congestive) heart failure: Secondary | ICD-10-CM | POA: Diagnosis not present

## 2022-10-08 DIAGNOSIS — M6281 Muscle weakness (generalized): Secondary | ICD-10-CM | POA: Diagnosis not present

## 2022-10-08 DIAGNOSIS — M179 Osteoarthritis of knee, unspecified: Secondary | ICD-10-CM | POA: Diagnosis not present

## 2022-10-08 DIAGNOSIS — L299 Pruritus, unspecified: Secondary | ICD-10-CM | POA: Diagnosis not present

## 2022-10-08 DIAGNOSIS — C3490 Malignant neoplasm of unspecified part of unspecified bronchus or lung: Secondary | ICD-10-CM | POA: Diagnosis not present

## 2022-10-08 DIAGNOSIS — I5043 Acute on chronic combined systolic (congestive) and diastolic (congestive) heart failure: Secondary | ICD-10-CM | POA: Diagnosis not present

## 2022-10-08 DIAGNOSIS — S2232XS Fracture of one rib, left side, sequela: Secondary | ICD-10-CM | POA: Diagnosis not present

## 2022-10-08 DIAGNOSIS — I739 Peripheral vascular disease, unspecified: Secondary | ICD-10-CM | POA: Diagnosis not present

## 2022-10-08 LAB — PROTIME-INR
INR: 1.4 — ABNORMAL HIGH (ref 0.8–1.2)
Prothrombin Time: 17.4 seconds — ABNORMAL HIGH (ref 11.4–15.2)

## 2022-10-08 LAB — GLUCOSE, CAPILLARY
Glucose-Capillary: 139 mg/dL — ABNORMAL HIGH (ref 70–99)
Glucose-Capillary: 132 mg/dL — ABNORMAL HIGH (ref 70–99)

## 2022-10-08 MED ORDER — POLYETHYLENE GLYCOL 3350 17 G PO PACK: 17.0000 g | PACK | Freq: Every day | ORAL | 0 refills | Status: DC

## 2022-10-08 MED ORDER — WARFARIN SODIUM 5 MG PO TABS: 10.0000 mg | ORAL_TABLET | Freq: Once | ORAL | Status: DC

## 2022-10-08 MED ORDER — INSULIN NPH ISOPHANE & REGULAR (70-30) 100 UNIT/ML ~~LOC~~ SUSP: 20.0000 [IU] | Freq: Two times a day (BID) | SUBCUTANEOUS | 0 refills | Status: AC

## 2022-10-08 MED ORDER — WARFARIN SODIUM 7.5 MG PO TABS: ORAL_TABLET | ORAL | 1 refills | Status: DC

## 2022-10-08 MED ORDER — OXYCODONE-ACETAMINOPHEN 5-325 MG PO TABS: 1.0000 | ORAL_TABLET | Freq: Four times a day (QID) | ORAL | 0 refills | Status: DC | PRN

## 2022-10-08 MED ORDER — DICLOFENAC SODIUM 1 % EX GEL: 2.0000 g | Freq: Four times a day (QID) | CUTANEOUS | 0 refills | Status: DC

## 2022-10-08 MED ORDER — WARFARIN SODIUM 7.5 MG PO TABS: 7.5000 mg | ORAL_TABLET | Freq: Once | ORAL | Status: DC

## 2022-10-08 MED ORDER — POLYETHYLENE GLYCOL 3350 17 G PO PACK: 17.0000 g | PACK | Freq: Every day | ORAL | Status: DC

## 2022-10-08 NOTE — Discharge Summary (Addendum)
Physician Discharge Summary   Patient: [name] MRN: [id number] DOB: [birth year]  Admit date:     10/01/2022  Discharge date: 10/08/22  Discharge Physician: [name]   PCP: Brandi Seashore, MD   Recommendations at discharge:    Patient had local steroid injection to her right knee on 01.09.24 with good toleration. Follow up renal function and electrolytes in 7 days. Follow up with [name] in 7 to 10 days.  Follow up INR on 10/11/22, target INR is 2 to 3.   Discharge Diagnoses: Principal Problem:   Chronic diastolic CHF (congestive heart failure) (HCC) Active Problems:   Arthritis of right knee   PVD (peripheral vascular disease), Lt SFA PTA Aug 2012   Type 2 diabetes mellitus with hyperlipidemia (HCC)   Essential hypertension   Paroxysmal atrial fibrillation (HCC)   CKD (chronic kidney disease) stage 3, GFR 30-59 ml/min (HCC)   Class 3 obesity ([place])  Resolved Problems:   * No resolved hospital problems. [facility] Course: [name] was admitted to the hospital with the working diagnosis of right knee osteoarthritis with recurrent falls.   72 year old African-American female past medical history significant for lung ca s/p surgery,hx of  CAD s/p stent LAD, HLD,Paroxysmal atrial fibrillation on warfarin ,  OSA does not wear cpap, CHFpef, PAD with chronic venostasis with b/l lower extremity ulcerations on calves followed by vascular and wound center, type 2 diabetes on insulin,  severe djd of b/l knees s/p TKR on Left,and morbid obesity who has had progressive debility, worsening right leg and knee pain with recurrent falls at home leading to multiple ED visits for falls. Chronic wounds of left leg. Patient not able to perform further activities of daily living.  On her initial physical examination her blood pressure was 148/72, HR 115, RR 20 and 02 saturation 100%. Lungs with no wheezing or rales, heart with S1 and S2 present and rhythmic,  abdomen with no distention, on lower extremity edema.   Na 132, K 4,2 CL 96 bicarbonate 25, glucose 266, bun 16 cr 1,19  BNP 174  Wbc 13,3 hgb 11,8 plt 278  INR 4, 2  Urine analysis SG 1,009, >500 glucose, negative protein, no leukocytes.   Head and cervical spine with no acute changes.   Right hip radiograph with no acute fractures  Bilateral shoulders radiograph with no acute fractures. \ Left ribs radiographs with displaced fracture of the left sixth rib posteriorly. No obvious pneumothorax on these limited supine radiographs.   Sars covid 19 negative   EKG 97 bpm, normal axis, right bundle branch block, qtc 520, sinus rhythm with no significant ST segment or T wave changes.   Started on antibiotics for possible cellulitis -Noted to have increased right knee pain, significantly limited range of motion  Orthopedics was consulted and patient had local marcaine and depomedrol injection with improvement of her symptoms.   Patient very weak and deconditioned, she is being discharge to SNF to continue physical therapy.   Assessment and Plan: * Chronic diastolic CHF (congestive heart failure) (HCC) Echocardiogram with preserved LV systolic function. Clinically euvolemic.  Plan to continue diuresis with furosemide.   Arthritis of right knee DJD.  Radiograph with severe tricompartmental osteoarthritis.  Orthopedics consulted and now patient is sp local injection to right knee with improvement of her symptoms.   Follow up right femur MRI with acute torn and moderately retracted right distal ilipsoas tendon.  Moderate subcutaneus edema in the right thigh especially medially without  drainable abscess. There is atrophy and low grade edema in the right musculature.  Osteoarthritis of the right knee with a small right knee effusion.  No acute bony findings.   Continue to have difficulty with mobility, plan to continue PT and Ot at SNF.  Patient is medically stable.   For her left rib  fracture, pain remained well controlled, plan to continue with physical therapy.   PVD (peripheral vascular disease), Lt SFA PTA Aug 2012 Continue blood pressure control and statins . Superficial wounds with dressing in place.  Follow up with wound care.   Type 2 diabetes mellitus with hyperlipidemia (HCC) Continue insulin therapy with 70/30 Her glucose remained well controlled during her hospitalization.   Essential hypertension Continue blood pressure control with metoprolol.   Paroxysmal atrial fibrillation (HCC) Rate control with metoprolol, continue anticoagulation with warfarin.  Discharge INR is 1.4  CKD (chronic kidney disease) stage 3, GFR 30-59 ml/min (HCC) Hyponatremia.   Her renal function remained stable, at the time of  her discharge her serum cr is 1,31 with K at 4,5 and serum bicarbonate at 26. NA 130  Plan to continue diuresis with furosemide and follow up renal function as outpatient.   Class 3 obesity (HCC) Calculated BMI is 50,3         Consultants: orthopedics  Procedures performed: right knee local injection (steroids)   Disposition: Skilled nursing facility Diet recommendation:  Cardiac and Carb modified diet DISCHARGE MEDICATION: Allergies as of 10/08/2022       Reactions   Levofloxacin Itching   Morphine And Related Itching, Other (See Comments)   Pt reports oxycodone history with no allergy.        Medication List     STOP taking these medications    traMADol 50 MG tablet Commonly known as: ULTRAM       TAKE these medications    diclofenac Sodium 1 % Gel Commonly known as: VOLTAREN Apply 2 g topically 4 (four) times daily. Right knee   furosemide 40 MG tablet Commonly known as: LASIX Take 1 tablet (40 mg total) by mouth 3 (three) times a week. TAKE Monday Wednesday AND FRIDAY What changed:  when to take this additional instructions   insulin NPH-regular Human (70-30) 100 UNIT/ML injection Inject 20 Units into the skin  2 (two) times daily with a meal. What changed:  how much to take when to take this additional instructions   lisinopril-hydrochlorothiazide 20-25 MG tablet Commonly known as: ZESTORETIC Take 1 tablet by mouth daily.   metoprolol succinate 50 MG 24 hr tablet Commonly known as: TOPROL-XL TAKE 1 TABLET BY MOUTH EVERY DAY   metoprolol tartrate 25 MG tablet Commonly known as: LOPRESSOR Take 1 table as needed for heart rate above 120bpm. What changed:  how much to take how to take this when to take this reasons to take this additional instructions   OneTouch Delica Plus Lancet33G Misc Apply topically.   OneTouch Verio Flex System w/Device Kit   OneTouch Verio test strip Generic drug: glucose blood 1 each 2 (two) times daily.   oxyCODONE-acetaminophen 5-325 MG tablet Commonly known as: PERCOCET/ROXICET Take 1 tablet by mouth every 6 (six) hours as needed for severe pain.   pantoprazole 40 MG tablet Commonly known as: PROTONIX Take 1 tablet (40 mg total) by mouth daily. What changed: when to take this   polyethylene glycol 17 g packet Commonly known as: MIRALAX / GLYCOLAX Take 17 g by mouth daily.   simvastatin 40 MG tablet  Commonly known as: ZOCOR TAKE 1 TABLET BY MOUTH DAILY AT 6 PM. What changed:  how much to take how to take this when to take this additional instructions   spironolactone 25 MG tablet Commonly known as: ALDACTONE Take 1 tablet (25 mg total) by mouth 3 (three) times a week. TAKE Monday Wednesday AND Friday What changed:  when to take this additional instructions   Vitamin D3 50 MCG (2000 UT) Tabs Take 2,000 Units by mouth daily.   warfarin 7.5 MG tablet Commonly known as: COUMADIN Take as directed. If you are unsure how to take this medication, talk to your nurse or doctor. Original instructions: 10/09/22 and 1/14:  Take 7.5mg  (1 tablet) daily 1/15 and on: resume previous dose of 3.75mg  (half tablet) on Mon/Wed/Fri and 7.5mg  (1 tablet) on  Tue/Thr/Sat/Sun What changed: additional instructions        Discharge Exam: Filed Weights   10/06/22 0304 10/07/22 0437 10/08/22 0235  Weight: 128.8 kg 123 kg 125.4 kg   BP (!) 141/65 (BP Location: Left Arm)   Pulse 90   Temp 98.2 F (36.8 C) (Oral)   Resp 18   Ht 5\' 3"  (1.6 m)   Wt 125.4 kg   SpO2 96%   BMI 48.97 kg/m   Patient is feeling better, her right knee pain is controlled, continue to be very weak and deconditioned.  Neurology sleeping at the time of my examination but easy to arouse ENT with no pallor Cardiovascular with S1 and S2 present with no gallops, regular rhythm Respiratory with no rales or wheezing Abdomen with no distention  Non pitting lower extremity edema   Condition at discharge: stable  The results of significant diagnostics from this hospitalization (including imaging, microbiology, ancillary and laboratory) are listed below for reference.   Imaging Studies: MR [facility] RIGHT WO CONTRAST  Result Date: 10/05/2022 CLINICAL DATA:  Right upper leg pain EXAM: MRI OF THE RIGHT FEMUR WITHOUT CONTRAST TECHNIQUE: Multiplanar, multisequence MR imaging of the right femur was performed. No intravenous contrast was administered. COMPARISON:  None Available. FINDINGS: Bones/Joint/Cartilage Both the hip and the knee joints are poorly seen due to boundary artifact. However, there is evidence of osteoarthritis of the right knee with a small right knee effusion. No compelling findings of right femoral fracture. Ligaments N/A Muscles and Tendons Acutely torn and moderately retracted right iliopsoas tendon. Surrounding edema noted along with edema in the distal tendon. Trace generalized edema tracking along muscular structures including the hip adductor musculature, hamstrings, and vastus musculature, probably related to third spacing of fluid. There is regional muscular atrophy as well. Soft tissues Moderate subcutaneous edema in the right thigh especially medially. No  drainable abscess. IMPRESSION: 1. Acutely torn and moderately retracted right distal iliopsoas tendon. 2. Moderate subcutaneous edema in the right thigh especially medially, without drainable abscess. There is atrophy and low-grade edema in the thigh musculature. 3. Osteoarthritis of the right knee with a small right knee effusion. 4. No acute bony findings. Electronically Signed   By: Van Clines M.D.   On: 10/05/2022 18:54   DG Knee Complete 4 Views Right  Result Date: 10/04/2022 CLINICAL DATA:  Right knee pain. EXAM: RIGHT KNEE - COMPLETE 4+ VIEW COMPARISON:  Right knee radiographs 09/01/2022 FINDINGS: Severe medial and patellofemoral and moderate lateral compartment joint space narrowing, subchondral sclerosis, and peripheral osteophytosis. No joint effusion. No acute fracture or dislocation. Moderate vascular calcifications. IMPRESSION: Severe medial and patellofemoral and moderate lateral compartment osteoarthritis. Electronically Signed   By:  Yvonne Kendall M.D.   On: 10/04/2022 18:54   VAS [place] LOWER EXTREMITY VENOUS (DVT)  Result Date: 10/03/2022  Lower Venous DVT Study Patient Name:  BRITINI GARCILAZO  Date of Exam:   10/03/2022 Medical Rec #: [id number]           Accession #:    [id number] Date of Birth: 01-28-1951            Patient Gender: F Patient Age:   34 years Exam Location:  [facility] Procedure:      VAS [place] LOWER EXTREMITY VENOUS (DVT) Referring Phys: Yehuda Savannah OGBATA --------------------------------------------------------------------------------  Indications: Edema and pain bilaterally. Other Indications: Chronic venous stasis with ulceration,. Limitations: Today's examination was severely limited secondary to body habitus, tissue properties, patient pain, skin changes, and overlying edema. Comparison Study: No recent prior venous orders. Prior ABI performed                   06-25-2022. Performing Technologist: Darlin Coco RDMS, RVT  Examination Guidelines: A complete  evaluation includes B-mode imaging, spectral Doppler, color Doppler, and power Doppler as needed of all accessible portions of each vessel. Bilateral testing is considered an integral part of a complete examination. Limited examinations for reoccurring indications may be performed as noted. The reflux portion of the exam is performed with the patient in reverse Trendelenburg.  +---------+---------------+---------+-----------+----------+-------------------+ RIGHT    CompressibilityPhasicitySpontaneityPropertiesThrombus Aging      +---------+---------------+---------+-----------+----------+-------------------+ CFV      Full           Yes      Yes                                      +---------+---------------+---------+-----------+----------+-------------------+ SFJ      Full                                                             +---------+---------------+---------+-----------+----------+-------------------+ FV Prox  Full                                                             +---------+---------------+---------+-----------+----------+-------------------+ FV Mid                  Yes      Yes                                      +---------+---------------+---------+-----------+----------+-------------------+ FV Distal                                             Not well visualized +---------+---------------+---------+-----------+----------+-------------------+ PFV      Full                                                             +---------+---------------+---------+-----------+----------+-------------------+  POP                     Yes      Yes                                      +---------+---------------+---------+-----------+----------+-------------------+ PTV                     Yes      Yes                                      +---------+---------------+---------+-----------+----------+-------------------+ PERO                                                   Not well visualized +---------+---------------+---------+-----------+----------+-------------------+   +---------+---------------+---------+-----------+----------+-------------------+ LEFT     CompressibilityPhasicitySpontaneityPropertiesThrombus Aging      +---------+---------------+---------+-----------+----------+-------------------+ CFV      Full           Yes      Yes                                      +---------+---------------+---------+-----------+----------+-------------------+ SFJ      Full                                                             +---------+---------------+---------+-----------+----------+-------------------+ FV Prox  Full                                                             +---------+---------------+---------+-----------+----------+-------------------+ FV Mid                  Yes      Yes                                      +---------+---------------+---------+-----------+----------+-------------------+ FV Distal               Yes      Yes                                      +---------+---------------+---------+-----------+----------+-------------------+ PFV      Full                                                             +---------+---------------+---------+-----------+----------+-------------------+ POP  Yes      Yes                                      +---------+---------------+---------+-----------+----------+-------------------+ PTV                                                   Not well visualized +---------+---------------+---------+-----------+----------+-------------------+ PERO                                                  Not well visualized +---------+---------------+---------+-----------+----------+-------------------+     Summary: RIGHT: - There is no evidence of deep vein thrombosis in the lower extremity. However, portions of  this examination were limited- see technologist comments above.  - No cystic structure found in the popliteal fossa.  LEFT: - There is no evidence of deep vein thrombosis in the lower extremity. However, portions of this examination were limited- see technologist comments above.  - No cystic structure found in the popliteal fossa.  *See table(s) above for measurements and observations. Electronically signed by Gerarda Fraction on 10/03/2022 at 3:02:15 PM.    Final    DG Ribs Unilateral W/Chest Left  Result Date: 10/01/2022 CLINICAL DATA:  pain EXAM: LEFT RIBS AND CHEST - 3+ VIEW COMPARISON:  Chest x-ray July 14, 2021. FINDINGS: Displaced fracture of the left sixth rib posteriorly. No obvious pneumothorax on these limited supine radiographs. No definite consolidation. Polyarticular degenerative change. Cardiomediastinal silhouette is enlarged, similar prior. Median sternotomy. IMPRESSION: Displaced fracture of the left sixth rib posteriorly. No obvious pneumothorax on these limited supine radiographs. Electronically Signed   By: Feliberto Harts M.D.   On: 10/01/2022 16:57   DG Hip Unilat With Pelvis 2-3 Views Left  Result Date: 10/01/2022 CLINICAL DATA:  Fall.  Pain. EXAM: DG HIP (WITH OR WITHOUT PELVIS) 2-3V LEFT; DG HIP (WITH OR WITHOUT PELVIS) 2-3V RIGHT COMPARISON:  Right hip radiographs 09/01/2022 FINDINGS: There is diffuse decreased bone mineralization. Mild bilateral sacroiliac subchondral sclerosis. The bilateral femoroacetabular joint spaces are maintained. No acute fracture is seen. No dislocation. IMPRESSION: 1. No acute fracture. 2. Mild bilateral sacroiliac osteoarthritis. Electronically Signed   By: Neita Garnet M.D.   On: 10/01/2022 16:57   DG Hip Unilat With Pelvis 2-3 Views Right  Result Date: 10/01/2022 CLINICAL DATA:  Fall.  Pain. EXAM: DG HIP (WITH OR WITHOUT PELVIS) 2-3V LEFT; DG HIP (WITH OR WITHOUT PELVIS) 2-3V RIGHT COMPARISON:  Right hip radiographs 09/01/2022 FINDINGS: There is  diffuse decreased bone mineralization. Mild bilateral sacroiliac subchondral sclerosis. The bilateral femoroacetabular joint spaces are maintained. No acute fracture is seen. No dislocation. IMPRESSION: 1. No acute fracture. 2. Mild bilateral sacroiliac osteoarthritis. Electronically Signed   By: Neita Garnet M.D.   On: 10/01/2022 16:57   DG Shoulder Right  Result Date: 10/01/2022 CLINICAL DATA:  Fall with shoulder pain EXAM: RIGHT SHOULDER - 3 VIEW COMPARISON:  Right shoulder radiographs dated 04/22/2008, CT chest dated 07/06/2022 FINDINGS: There is no evidence of fracture or dislocation. Mild degenerative changes of the glenohumeral and acromioclavicular joints. Soft tissues are unremarkable. Linear calcific density projecting inferomedial to the glenoid likely reflects atherosclerotic calcification  in this region. Partially imaged calcifications in the right breast. IMPRESSION: 1. No acute fracture or dislocation. 2. Mild degenerative changes of the glenohumeral and acromioclavicular joints. Electronically Signed   By: Agustin Cree M.D.   On: 10/01/2022 16:56   DG Shoulder Left  Result Date: 10/01/2022 CLINICAL DATA:  Pain, fall. EXAM: LEFT SHOULDER - 2+ VIEW COMPARISON:  None Available. FINDINGS: There is no evidence of fracture or dislocation. Soft tissue calcifications adjacent to the greater tuberosity are likely related to calcific tendinitis. There is mild degenerative narrowing of the joint spaces. IMPRESSION: 1. No fracture or dislocation. Electronically Signed   By: Darliss Cheney M.D.   On: 10/01/2022 16:53   CT Cervical Spine Wo Contrast  Result Date: 10/01/2022 CLINICAL DATA:  Larey Seat, right knee pain EXAM: CT CERVICAL SPINE WITHOUT CONTRAST TECHNIQUE: Multidetector CT imaging of the cervical spine was performed without intravenous contrast. Multiplanar CT image reconstructions were also generated. RADIATION DOSE REDUCTION: This exam was performed according to the departmental dose-optimization  program which includes automated exposure control, adjustment of the mA and/or kV according to patient size and/or use of iterative reconstruction technique. COMPARISON:  None Available. FINDINGS: Alignment: Alignment is grossly anatomic. Skull base and vertebrae: No acute fracture. No primary bone lesion or focal pathologic process. Soft tissues and spinal canal: No prevertebral fluid or swelling. No visible canal hematoma. Disc levels:  Mild multilevel spondylosis greatest at C5-6. Upper chest: Airway is patent. Visualized portions of the lung apices are clear. Other: Reconstructed images demonstrate no additional findings. IMPRESSION: 1. No acute cervical spine fracture. Electronically Signed   By: Sharlet Salina M.D.   On: 10/01/2022 16:10   CT Head Wo Contrast  Result Date: 10/01/2022 CLINICAL DATA:  Larey Seat 2 days ago, anticoagulated EXAM: CT HEAD WITHOUT CONTRAST TECHNIQUE: Contiguous axial images were obtained from the base of the skull through the vertex without intravenous contrast. RADIATION DOSE REDUCTION: This exam was performed according to the departmental dose-optimization program which includes automated exposure control, adjustment of the mA and/or kV according to patient size and/or use of iterative reconstruction technique. COMPARISON:  01/31/2012 FINDINGS: Brain: No acute infarct or hemorrhage. Lateral ventricles and midline structures are unremarkable. No acute extra-axial fluid collections. No mass effect. Vascular: No hyperdense vessel or unexpected calcification. Skull: Normal. Negative for fracture or focal lesion. Sinuses/Orbits: No acute finding. Other: None. IMPRESSION: 1. No acute intracranial process. Electronically Signed   By: Sharlet Salina M.D.   On: 10/01/2022 16:08   DG Pelvis Portable  Result Date: 09/29/2022 CLINICAL DATA:  Pain weakness EXAM: PORTABLE PELVIS 1-2 VIEWS COMPARISON:  11/26/2011, 09/01/2022 FINDINGS: SI joints are non widened. Pubic symphysis is intact. No  fracture or malalignment. Vascular calcifications. IMPRESSION: No acute osseous abnormality Electronically Signed   By: Jasmine Pang M.D.   On: 09/29/2022 21:15   MM 3D SCREEN BREAST BILATERAL  Result Date: 09/28/2022 CLINICAL DATA:  Screening. EXAM: DIGITAL SCREENING BILATERAL MAMMOGRAM WITH TOMOSYNTHESIS AND CAD TECHNIQUE: Bilateral screening digital craniocaudal and mediolateral oblique mammograms were obtained. Bilateral screening digital breast tomosynthesis was performed. The images were evaluated with computer-aided detection. COMPARISON:  Previous exam(s). ACR Breast Density Category b: There are scattered areas of fibroglandular density. FINDINGS: There are no findings suspicious for malignancy. IMPRESSION: No mammographic evidence of malignancy. A result letter of this screening mammogram will be mailed directly to the patient. RECOMMENDATION: Screening mammogram in one year. (Code:SM-B-01Y) BI-RADS CATEGORY  1: Negative. Electronically Signed   By: Norva Pavlov M.D.  On: 09/28/2022 09:35    Microbiology: Results for orders placed or performed during the hospital encounter of 10/01/22  Culture, blood (Routine X 2) w Reflex to ID Panel     Status: None   Collection Time: 10/01/22 11:48 PM   Specimen: BLOOD RIGHT FOREARM  Result Value Ref Range Status   Specimen Description BLOOD RIGHT FOREARM  Final   Special Requests   Final    BOTTLES DRAWN AEROBIC AND ANAEROBIC Blood Culture results may not be optimal due to an excessive volume of blood received in culture bottles   Culture   Final    NO GROWTH 5 DAYS Performed at [facility], [address] [place], [place] [address]    Report Status 10/07/2022 FINAL  Final  Urine Culture     Status: Abnormal   Collection Time: 10/02/22  3:07 AM   Specimen: Urine, Clean Catch  Result Value Ref Range Status   Specimen Description URINE, CLEAN CATCH  Final   Special Requests   Final    NONE Performed at [facility], [address], [place], [place] [address]    Culture MULTIPLE SPECIES PRESENT, SUGGEST RECOLLECTION (A)  Final   Report Status 10/03/2022 FINAL  Final  Culture, blood (Routine X 2) w Reflex to ID Panel     Status: None   Collection Time: 10/02/22  5:01 PM   Specimen: BLOOD  Result Value Ref Range Status   Specimen Description BLOOD BLOOD RIGHT ARM  Final   Special Requests   Final    BOTTLES DRAWN AEROBIC AND ANAEROBIC Blood Culture results may not be optimal due to an inadequate volume of blood received in culture bottles   Culture   Final    NO GROWTH 5 DAYS Performed at [facility], [address], [place], [place] [address]    Report Status 10/07/2022 FINAL  Final  MRSA Next Gen by PCR, Nasal     Status: None   Collection Time: 10/03/22  1:23 AM   Specimen: Nasal Mucosa; Nasal Swab  Result Value Ref Range Status   MRSA by PCR Next Gen NOT DETECTED NOT DETECTED Final    Comment: (NOTE) The GeneXpert MRSA Assay (FDA approved for NASAL specimens only), is one component of a comprehensive MRSA colonization surveillance program. It is not intended to diagnose MRSA infection nor to guide or monitor treatment for MRSA infections. Test performance is not FDA approved in patients less than 54 years old. Performed at [facility], [address], [place], [place] [address]     Labs: CBC: Recent Labs  Lab 10/01/22 1509 10/02/22 0512 10/03/22 0056 10/05/22 0109 10/06/22 0036  WBC 13.3* 10.6* 11.1* 9.8 10.0  NEUTROABS 11.1*  --  7.8*  --   --   HGB 11.8* 10.0* 10.6* 10.4* 10.0*  HCT 36.2 30.7* 31.9* 31.9* 31.0*  MCV 88.7 87.7 88.1 88.6 89.3  PLT 278 270 259 305 311   Basic Metabolic Panel: Recent Labs  Lab 10/02/22 0512 10/03/22 0056 10/04/22 0042 10/05/22 0109 10/06/22 0036  NA 134* 134* 132* 132* 130*  K 3.8 3.7 3.6 3.7 4.5  CL 95* 94* 93* 94* 95*  CO2 28 27 30 27 26   GLUCOSE 137* 112* 128* 168* 188*  BUN 15 19 21 21 21   CREATININE 1.22* 1.50* 1.32* 1.36*  1.31*  CALCIUM 9.2 9.1 8.7* 8.9 9.2  MG  --  1.6* 2.0  --   --  PHOS  --  3.2 3.4  --   --    Liver Function Tests: Recent Labs  Lab 10/01/22 1509 10/02/22 0512 10/03/22 0056 10/04/22 0042  AST 30 28  --   --   ALT 18 16  --   --   ALKPHOS 91 80  --   --   BILITOT 1.3* 0.8  --   --   PROT 7.8 6.3*  --   --   ALBUMIN 3.1* 2.6* 2.6* 2.4*   CBG: Recent Labs  Lab 10/07/22 1127 10/07/22 1559 10/07/22 2056 10/08/22 0628 10/08/22 1128  GLUCAP 177* 254* 184* 139* 132*    Discharge time spent: greater than 30 minutes.  Signed: Coralie Keens, MD [facility] 10/08/2022

## 2022-10-08 NOTE — TOC Transition Note (Addendum)
Transition of Care [facility]) - CM/SW Discharge Note   Patient Details  Name: [name] MRN: [id number] Date of Birth: May 01, 1951  Transition of Care [facility]) CM/SW Contact:  Leander Rams, LCSW Phone Number: 10/08/2022, 1:23 PM   Clinical Narrative:    Patient will DC to: [facility] Anticipated DC date: 10/08/2022 Family notified: Son is aware as he is in room with pt now.  Transport by: [facility]   Per MD patient ready for DC to [facility]. RN, patient, patient's family, and facility notified of DC. Discharge Summary and FL2 sent to facility. RN to call report prior to discharge [phone]. DC packet on chart. Ambulance transport requested for patient.   Insurance auth for SNF is [phone] and Berkley Harvey for [facility] is [phone].  CSW will sign off for now as social work intervention is no longer needed. Please consult [place] again if new needs arise.    Final next level of care: Skilled Nursing Facility Barriers to Discharge: No Barriers Identified   Patient Goals and CMS Choice      Discharge Placement                Patient chooses bed at: [facility] Patient to be transferred to facility by: [facility] Name of family member notified: N/A Patient and family notified of of transfer: 10/08/22  Discharge Plan and Services Additional resources added to the After Visit Summary for   In-house Referral: Clinical Social Work                                   Social Determinants of Health (SDOH) Interventions SDOH Screenings   Food Insecurity: No Food Insecurity (10/02/2022)  Housing: Low Risk  (10/02/2022)  Transportation Needs: No Transportation Needs (10/02/2022)  Utilities: Not At Risk (10/02/2022)  Depression (PHQ2-9): Low Risk  (05/07/2020)  Stress: Stress Concern Present (11/14/2019)  Tobacco Use: Medium Risk (10/01/2022)     Readmission Risk Interventions     No data to display          Oletta Lamas, MSW, LCSWA, LCASA Transitions of Care  Clinical  Social Worker I

## 2022-10-08 NOTE — Progress Notes (Addendum)
ANTICOAGULATION CONSULT NOTE - Initial Consult  Pharmacy Consult for warfarin  Indication: atrial fibrillation  Allergies  Allergen Reactions   Levofloxacin Itching   Morphine And Related Itching and Other (See Comments)    Pt reports oxycodone history with no allergy.    Vital Signs: Temp: 98.2 F (36.8 C) (01/12 0235) Temp Source: Oral (01/12 0235) BP: 141/65 (01/12 0235) Pulse Rate: 90 (01/12 0235)  Labs: Recent Labs    10/06/22 0036 10/07/22 0206 10/08/22 0104  HGB 10.0*  --   --   HCT 31.0*  --   --   PLT 311  --   --   LABPROT 19.4* 17.6* 17.4*  INR 1.7* 1.5* 1.4*  CREATININE 1.31*  --   --     Estimated Creatinine Clearance: 50 mL/min (A) (by C-G formula based on SCr of 1.31 mg/dL (H)).   Medical History: Past Medical History:  Diagnosis Date   Arthritis    "knees" (02/05/2016)   Cancer (HCC) 5/10   [facility]    CHF (congestive heart failure) (HCC)    Coronary artery disease    GERD (gastroesophageal reflux disease)    tx meds   H/O hiatal hernia    History of blood transfusion "several"   last april 2015 s/p knee surgery (02/05/2016)   Hypercholesteremia    Hypertension    Lung cancer (HCC)    2010, surgery 18% left lung no radiation or chemo   Non-STEMI (non-ST elevated myocardial infarction) (HCC) 04/07/2012   See cath results below   Paroxysmal atrial fibrillation (HCC) 04/07/2012   On Warfarin   Peripheral vascular disease (HCC) 8/12   Lt SFA PTA   Presence of stent in LAD coronary artery 04/07/2012   Xience Expedition DES 2.75 mm x 18 mm (dilated to 3.0 mm)   Sleep apnea    does not wear CPAP   Type II diabetes mellitus (HCC)    insulin dependent   Assessment: Patient admitted with CC of knee pain and recurring falls. History of afib on warfarin. PTA patient was taking warfarin 7.5mg  TuThSaSu and 3.75mg  on M/W/F. Admit INR up to 4.6.   INR trended down to 1.4 today, downtrend is slowing. Eating 100% of meals. Warfarin held 1/8-1/9  for  steroid knee injection by ortho, restarted 1/10.   Goal of Therapy:  INR 2-3 Monitor platelets by anticoagulation protocol: Yes   Plan:  For discharge, suggest warfarin 7.5mg  daily on 1/12 - 1/14 then resume home dose of 3.75mg  on MWF and 7.5mg  TuThSaSu with INR check by Monday 1/15   Alphia Moh, PharmD, BCPS, BCCP Clinical Pharmacist  Please check AMION for all [facility] phone numbers After 10:00 PM, call Main Pharmacy [phone]

## 2022-10-11 DIAGNOSIS — S2232XS Fracture of one rib, left side, sequela: Secondary | ICD-10-CM | POA: Diagnosis not present

## 2022-10-11 DIAGNOSIS — I480 Paroxysmal atrial fibrillation: Secondary | ICD-10-CM | POA: Diagnosis not present

## 2022-10-11 DIAGNOSIS — I10 Essential (primary) hypertension: Secondary | ICD-10-CM | POA: Diagnosis not present

## 2022-10-11 DIAGNOSIS — M179 Osteoarthritis of knee, unspecified: Secondary | ICD-10-CM | POA: Diagnosis not present

## 2022-10-12 DIAGNOSIS — J449 Chronic obstructive pulmonary disease, unspecified: Secondary | ICD-10-CM | POA: Diagnosis not present

## 2022-10-13 DIAGNOSIS — Z9181 History of falling: Secondary | ICD-10-CM | POA: Diagnosis not present

## 2022-10-13 DIAGNOSIS — I5022 Chronic systolic (congestive) heart failure: Secondary | ICD-10-CM | POA: Diagnosis not present

## 2022-10-13 DIAGNOSIS — M6281 Muscle weakness (generalized): Secondary | ICD-10-CM | POA: Diagnosis not present

## 2022-10-13 DIAGNOSIS — R2689 Other abnormalities of gait and mobility: Secondary | ICD-10-CM | POA: Diagnosis not present

## 2022-10-13 DIAGNOSIS — I480 Paroxysmal atrial fibrillation: Secondary | ICD-10-CM | POA: Diagnosis not present

## 2022-10-13 DIAGNOSIS — M179 Osteoarthritis of knee, unspecified: Secondary | ICD-10-CM | POA: Diagnosis not present

## 2022-10-14 DIAGNOSIS — I480 Paroxysmal atrial fibrillation: Secondary | ICD-10-CM | POA: Diagnosis not present

## 2022-10-18 DIAGNOSIS — I480 Paroxysmal atrial fibrillation: Secondary | ICD-10-CM | POA: Diagnosis not present

## 2022-10-20 ENCOUNTER — Telehealth: Payer: Self-pay | Admitting: Orthopedic Surgery

## 2022-10-20 DIAGNOSIS — Z9181 History of falling: Secondary | ICD-10-CM | POA: Diagnosis not present

## 2022-10-20 DIAGNOSIS — I480 Paroxysmal atrial fibrillation: Secondary | ICD-10-CM | POA: Diagnosis not present

## 2022-10-20 DIAGNOSIS — R2689 Other abnormalities of gait and mobility: Secondary | ICD-10-CM | POA: Diagnosis not present

## 2022-10-20 DIAGNOSIS — M179 Osteoarthritis of knee, unspecified: Secondary | ICD-10-CM | POA: Diagnosis not present

## 2022-10-20 DIAGNOSIS — I5022 Chronic systolic (congestive) heart failure: Secondary | ICD-10-CM | POA: Diagnosis not present

## 2022-10-20 DIAGNOSIS — M6281 Muscle weakness (generalized): Secondary | ICD-10-CM | POA: Diagnosis not present

## 2022-10-20 NOTE — Telephone Encounter (Signed)
Pt called requesting Dr Marlou Sa to call her son to discuss surgery right knee. Pt states she is in a facility and can not do an appt. Please call pt's son Ilona Sorrel at [phone].

## 2022-10-21 DIAGNOSIS — L299 Pruritus, unspecified: Secondary | ICD-10-CM | POA: Diagnosis not present

## 2022-10-23 DIAGNOSIS — R2689 Other abnormalities of gait and mobility: Secondary | ICD-10-CM | POA: Diagnosis not present

## 2022-10-23 DIAGNOSIS — I480 Paroxysmal atrial fibrillation: Secondary | ICD-10-CM | POA: Diagnosis not present

## 2022-10-23 DIAGNOSIS — Z9181 History of falling: Secondary | ICD-10-CM | POA: Diagnosis not present

## 2022-10-23 DIAGNOSIS — M6281 Muscle weakness (generalized): Secondary | ICD-10-CM | POA: Diagnosis not present

## 2022-10-23 DIAGNOSIS — I5022 Chronic systolic (congestive) heart failure: Secondary | ICD-10-CM | POA: Diagnosis not present

## 2022-10-23 DIAGNOSIS — M179 Osteoarthritis of knee, unspecified: Secondary | ICD-10-CM | POA: Diagnosis not present

## 2022-10-23 NOTE — Telephone Encounter (Signed)
I called she will be dced from facility on 2/2 not really a candidate for tka but she will come on 2/9

## 2022-10-25 DIAGNOSIS — I480 Paroxysmal atrial fibrillation: Secondary | ICD-10-CM | POA: Diagnosis not present

## 2022-10-25 DIAGNOSIS — L89612 Pressure ulcer of right heel, stage 2: Secondary | ICD-10-CM | POA: Diagnosis not present

## 2022-10-26 DIAGNOSIS — L299 Pruritus, unspecified: Secondary | ICD-10-CM | POA: Diagnosis not present

## 2022-10-26 DIAGNOSIS — C3490 Malignant neoplasm of unspecified part of unspecified bronchus or lung: Secondary | ICD-10-CM | POA: Diagnosis not present

## 2022-10-26 DIAGNOSIS — I480 Paroxysmal atrial fibrillation: Secondary | ICD-10-CM | POA: Diagnosis not present

## 2022-10-27 DIAGNOSIS — M179 Osteoarthritis of knee, unspecified: Secondary | ICD-10-CM | POA: Diagnosis not present

## 2022-10-27 DIAGNOSIS — I480 Paroxysmal atrial fibrillation: Secondary | ICD-10-CM | POA: Diagnosis not present

## 2022-10-27 DIAGNOSIS — R2689 Other abnormalities of gait and mobility: Secondary | ICD-10-CM | POA: Diagnosis not present

## 2022-10-27 DIAGNOSIS — M6281 Muscle weakness (generalized): Secondary | ICD-10-CM | POA: Diagnosis not present

## 2022-10-27 DIAGNOSIS — Z9181 History of falling: Secondary | ICD-10-CM | POA: Diagnosis not present

## 2022-10-27 DIAGNOSIS — I5022 Chronic systolic (congestive) heart failure: Secondary | ICD-10-CM | POA: Diagnosis not present

## 2022-10-28 DIAGNOSIS — L299 Pruritus, unspecified: Secondary | ICD-10-CM | POA: Diagnosis not present

## 2022-10-28 DIAGNOSIS — C3490 Malignant neoplasm of unspecified part of unspecified bronchus or lung: Secondary | ICD-10-CM | POA: Diagnosis not present

## 2022-10-30 DIAGNOSIS — S91301A Unspecified open wound, right foot, initial encounter: Secondary | ICD-10-CM | POA: Diagnosis not present

## 2022-11-01 NOTE — Progress Notes (Deleted)
Cardiology Clinic Note   Patient Name: [name] Date of Encounter: 11/01/2022  Primary Care Provider:  [name], MD Primary Cardiologist:  Sanda Klein, MD  Patient Profile    [name] 72 year old female presents the clinic today for follow-up evaluation of her tachycardia  Past Medical History    Past Medical History:  Diagnosis Date   Arthritis    "knees" (02/05/2016)   Cancer ([place]) 5/10   LUL Vats    CHF (congestive heart failure) ([place])    Coronary artery disease    GERD (gastroesophageal reflux disease)    tx meds   H/O hiatal hernia    History of blood transfusion "several"   last april 2015 s/p knee surgery (02/05/2016)   Hypercholesteremia    Hypertension    Lung cancer ([place])    2010, surgery 18% left lung no radiation or chemo   Non-STEMI (non-ST elevated myocardial infarction) ([place]) 04/07/2012   See cath results below   Paroxysmal atrial fibrillation ([place]) 04/07/2012   On Warfarin   Peripheral vascular disease ([place]) 8/12   Lt SFA PTA   Presence of stent in LAD coronary artery 04/07/2012   Xience Expedition DES 2.75 mm x 18 mm (dilated to 3.0 mm)   Sleep apnea    does not wear CPAP   Type II diabetes mellitus (HCC)    insulin dependent   Past Surgical History:  Procedure Laterality Date   ABDOMINAL HYSTERECTOMY  1990   BREAST EXCISIONAL BIOPSY Right    CARDIAC CATHETERIZATION  11/04/2008   patent RCA, LM, and Circ, nl EF   CARDIAC CATHETERIZATION  02/20/2002   patent coronaries with the only abnormality being a smooth luminla irregularity in the mid intermediate ramus branch no felt to be hemodynamically significant, nl LV   CARDIAC CATHETERIZATION N/A 02/05/2016   Procedure: Left Heart Cath and Coronary Angiography;  Surgeon: Leonie Man, MD;  Location: [facility];  Service: Cardiovascular;  Laterality: N/A;   CARDIAC CATHETERIZATION N/A 02/05/2016   Procedure: Coronary Stent Intervention;  Surgeon: Leonie Man,  MD;  Location: [facility];  Service: Cardiovascular;  Laterality: N/A;   CARDIOVERSION N/A 07/16/2021   Procedure: CARDIOVERSION;  Surgeon: Werner Lean, MD;  Location: [facility];  Service: Cardiovascular;  Laterality: N/A;   CATARACT EXTRACTION W/ INTRAOCULAR LENS  IMPLANT, BILATERAL  2013   CORONARY ANGIOPLASTY WITH STENT PLACEMENT  04/07/2012   Xience Expedition DES 2.62mm x 18 mm (dilated to 3.0 mm) to the prox LAD    CORONARY ANGIOPLASTY WITH STENT PLACEMENT  2013; 2015   CORONARY ARTERY BYPASS GRAFT N/A 08/17/2017   Procedure: CORONARY ARTERY BYPASS GRAFTING (CABG) TIMES 1 USING LEFT INTERNAL MAMMARY ARTERY;  Surgeon: Melrose Nakayama, MD;  Location: [facility];  Service: Open Heart Surgery;  Laterality: N/A;   DILATION AND CURETTAGE OF UTERUS  <1990   ESOPHAGOGASTRODUODENOSCOPY N/A 05/23/2015   Procedure: ESOPHAGOGASTRODUODENOSCOPY (EGD);  Surgeon: Carol Ada, MD;  Location: [facility];  Service: Endoscopy;  Laterality: N/A;   ESOPHAGOGASTRODUODENOSCOPY (EGD) WITH PROPOFOL N/A 06/13/2015   Procedure: ESOPHAGOGASTRODUODENOSCOPY (EGD) WITH PROPOFOL;  Surgeon: Carol Ada, MD;  Location: [facility];  Service: Endoscopy;  Laterality: N/A;   ESOPHAGOGASTRODUODENOSCOPY (EGD) WITH PROPOFOL N/A 04/07/2018   Procedure: ESOPHAGOGASTRODUODENOSCOPY (EGD) WITH PROPOFOL;  Surgeon: Carol Ada, MD;  [facility];  Service: Endoscopy;  Laterality: N/A;  fluoroscopy is needed   FRACTURE SURGERY     HAMMER TOE SURGERY Bilateral 1993  with screws, on screw removed   JOINT REPLACEMENT     KNEE ARTHROSCOPY Bilateral    left x2, right x1   LAPAROSCOPIC CHOLECYSTECTOMY     LEFT HEART CATH AND CORONARY ANGIOGRAPHY N/A 08/11/2017   Procedure: LEFT HEART CATH AND CORONARY ANGIOGRAPHY;  Surgeon: Martinique, Peter M, MD;  Location: [facility];  Service: Cardiovascular;  Laterality: N/A;   LEFT HEART CATHETERIZATION WITH CORONARY ANGIOGRAM N/A 04/07/2012   Procedure: LEFT  HEART CATHETERIZATION WITH CORONARY ANGIOGRAM;  Surgeon: [name], MD;  Location: [facility];  Service: Cardiovascular;  Laterality: N/A;   LEFT HEART CATHETERIZATION WITH CORONARY ANGIOGRAM N/A 05/27/2014   Procedure: LEFT HEART CATHETERIZATION WITH CORONARY ANGIOGRAM;  Surgeon: [name], MD; LAD 99% ISR, CFX 50-60%, RCA (dominant) no sig dz, EF 60%   LOWER EXTREMITY ANGIOGRAM  05/17/11   directional atherectomy to the prox L SFA using a LX Man TurboHawk, ballooned with a [place] balloon    ORIF ANKLE FRACTURE  07/09/2012   Procedure: OPEN REDUCTION INTERNAL FIXATION (ORIF) ANKLE FRACTURE;  Surgeon: Meredith Pel, MD;  [facility];  Service: Orthopedics;  Laterality: Left;  open reduction internal fixation trimalleolar ankle fracture medial malleolous fixation   PERCUTANEOUS CORONARY STENT INTERVENTION (PCI-S) N/A 04/07/2012   Procedure: PERCUTANEOUS CORONARY STENT INTERVENTION (PCI-S);  Surgeon: [name], MD;  Location: [facility] CATH LAB;  Service: Cardiovascular;  Laterality: N/A;   PERCUTANEOUS CORONARY STENT INTERVENTION (PCI-S)  05/27/2014   Procedure: PERCUTANEOUS CORONARY STENT INTERVENTION (PCI-S);  Surgeon: [name], MD; 3 mm x 12 mm long Xience Xpedition DES to the proximal LAD   SAVORY DILATION N/A 06/13/2015   Procedure: SAVORY DILATION;  Surgeon: Carol Ada, MD;  [facility];  Service: Endoscopy;  Laterality: N/A;   SAVORY DILATION N/A 04/07/2018   Procedure: SAVORY DILATION;  Surgeon: Carol Ada, MD;  [facility];  Service: Endoscopy;  Laterality: N/A;   TEE WITHOUT CARDIOVERSION N/A 08/17/2017   Procedure: TRANSESOPHAGEAL ECHOCARDIOGRAM (TEE);  Surgeon: Melrose Nakayama, MD;  Location: [facility];  Service: Open Heart Surgery;  Laterality: N/A;   TEE WITHOUT CARDIOVERSION N/A 07/16/2021   Procedure: TRANSESOPHAGEAL ECHOCARDIOGRAM (TEE);  Surgeon: Werner Lean, MD;  Location: [facility];  Service:  Cardiovascular;  Laterality: N/A;   TONSILLECTOMY     TOTAL KNEE ARTHROPLASTY Left 2003   TOTAL KNEE REVISION Left 11/05/2013   Procedure: LEFT TOTAL KNEE RESECTION;  Surgeon: Mauri Pole, MD;  [facility];  Service: Orthopedics;  Laterality: Left;   TOTAL KNEE REVISION Left 2007   opened in 2006 and cleaned out, 2007 revision   TOTAL KNEE REVISION Left 12/31/2013   Procedure: RE-INPLANTATION LEFT TOTAL KNEE ;  Surgeon: Mauri Pole, MD;  [facility]: [facility];  Service: Orthopedics;  Laterality: Left;   VIDEO ASSISTED THORACOSCOPY (VATS)/ LOBECTOMY  01/2009    Allergies  Allergies  Allergen Reactions   Levofloxacin Itching   Morphine And Related Itching and Other (See Comments)    Pt reports oxycodone history with no allergy.    History of Present Illness    Aronda A Matusik has a PMH of coronary artery disease, peripheral arterial disease, chronic diastolic CHF, paroxysmal atrial fibrillation, type 2 diabetes on insulin, palpitations, and morbid obesity.  She underwent CABG 11/18 for recurrent in-stent restenosis with multiple PCI.  Her PCI with DES to LAD 2013, DES to distal LAD 2015, 4/17 received DES to proximal LAD, 11/18 was noted to  have 3 stenosis and referred for single-vessel CABG.  She also underwent left superficial femoral artery arthrectomy.  She woke up early on 10/18 to check on her furnace which was not working.  She noticed that she felt sick and had palpitations.  She denied chest pain.  She checked her pulse using her pulse oximeter and noted that her pulse was in the 240 bpm range.  She checked her pulse again a few minutes later and noticed that her pulse was 120s.  It continued to sustain in the 120s and she presented to the emergency department for evaluation.  Her EKG showed atrial fibrillation versus atypical atrial flutter and the same pattern was noted on her telemetry.  She reported that she had not taken her/received her medications the previous evening.   The discrepancies/inaccuracies with pulse oximetry and the importance of medication compliance were discussed.  Her INR was checked and noted to be 1.5 on admission.  She was bridged with Lovenox.  Her echocardiogram showed normal EF.  She remained stable on her metoprolol 50 mg daily.  She underwent successful TEE and cardioversion.  She maintained sinus rhythm overnight and her INR improved to 2.3 and her hemoglobin was noted to be 10.9.  Her Lovenox was discontinued with plan to continue her home warfarin.  Her metoprolol tartrate was changed to metoprolol succinate.  At discharge her heart rate was stable in the 80s.  She was advised to use an extra metoprolol tartrate 25 mg as needed.  Her BNP was mildly elevated which was felt to be a possible cause of her converting to atrial flutter and her mild decompensation.  She was given p.o. Lasix 80 mg and given home dose of 40 mg as needed.  Her HCTZ was continued.  She received sliding scale insulin during her hospital admission.  She was asked to resume her home medication regimen at discharge.  She presents to the clinic today for follow-up evaluation states she feels tired this morning.  She is not used to getting up this early.  She reports that she has not been taking her furosemide or spironolactone daily.  Her weight is stable today.  I recommended that she take her spironolactone and furosemide 3 days/week, Monday Wednesday Friday.  Her EKG today shows normal sinus rhythm 84 bpm.  Her blood pressure is well controlled at 134/66.  She reports compliance with her warfarin and denies bleeding issues.  She reports that she has a appointment with the wound center for continued evaluation and treatment of her lower extremities.  I will give her the salty 6 diet sheet, give her the [place] support stocking sheet, have her increase her physical activity as tolerated, and plan follow-up in 3 to 4 months.  Today she denies chest pain, shortness of breath, lower  extremity edema, fatigue, palpitations, melena, hematuria, hemoptysis, diaphoresis, weakness, presyncope, syncope, orthopnea, and PND.    Atrial flutter-EKG today shows normal sinus rhythm left axis deviation right bundle branch block LVH 84 BPM.  Reports compliance with warfarin and denies bleeding issues.  Recently admitted to the hospital on 07/15/2021 and discharged on 07/17/2021.  She was noted to be in atrial flutter versus coarse atrial fibrillation on telemetry and EKG.  She underwent TEE and DCCV.  She converted to sinus rhythm. Continue metoprolol, warfarin Heart healthy low-sodium diet-salty 6 given Increase physical activity as tolerated Avoid triggers caffeine, chocolate, EtOH, dehydration etc.  Chronic diastolic CHF-euvolemic today.  No increased DOE or activity intolerance.  Weight  stable.  1 cm x 1 cm anterior left shin blister, nondraining Continue metoprolol, furosemide, spironolactone, furosimide Heart healthy low-sodium diet-salty 6 given Increase physical activity as tolerated Follow-up with wound care-has made an appointment. Lower extremity support stockings-[place] support stocking sheet given  Essential hypertension-BP today 134/66.  Well-controlled at home. Continue metoprolol, lisinopril, HCTZ, spironolactone Heart healthy low-sodium diet-salty 6 given Increase physical activity as tolerated  Diabetes mellitus type 2-A1c 8.9 on 07/16/2021 Continue insulin Heart healthy low-sodium carb modified diet Increase physical activity as tolerated Follows with PCP  Disposition: Follow-up with Dr. Sallyanne Kuster in 3-4 months.  Home Medications    Prior to Admission medications   Medication Sig Start Date End Date Taking? Authorizing Provider  Blood Glucose Monitoring Suppl ([place]) w/Device KIT  07/23/20   [provider]  Continuous Blood Gluc Receiver (FREESTYLE LIBRE 2 READER) [place] See admin instructions. 11/18/20   [provider]   furosemide (LASIX) 40 MG tablet Take 1 tablet (40 mg total) by mouth daily as needed for edema (For a weight over 295 lbs). 12/18/20   Croitoru, Mihai, MD  insulin NPH-regular Human (70-30) 100 UNIT/ML injection Inject 20-62 Units into the skin 2 (two) times daily with a meal. 62 units in the morning and 20 units at bedtme    [provider]  Lancets (ONETOUCH DELICA PLUS [id number]) [place] Apply topically. 08/13/20   [provider]  lisinopril-hydrochlorothiazide (PRINZIDE,ZESTORETIC) 20-25 MG per tablet Take 1 tablet by mouth daily.    [provider]  metoCLOPramide (REGLAN) 10 MG tablet Take 10 mg by mouth 2 (two) times daily. Per patient    [provider]  metoprolol succinate (TOPROL XL) 50 MG 24 hr tablet Take 1 tablet (50 mg total) by mouth daily. 07/17/21 07/17/22  Leanor Kail, PA  metoprolol tartrate (LOPRESSOR) 25 MG tablet Take 1 table as needed for heart rate above 120bpm. 07/17/21   Bhagat, Bhavinkumar, PA  ONETOUCH VERIO test strip 1 each 2 (two) times daily. 10/11/20   [provider]  pantoprazole (PROTONIX) 40 MG tablet Take 1 tablet (40 mg total) by mouth daily. 06/17/21   Croitoru, Mihai, MD  simvastatin (ZOCOR) 40 MG tablet TAKE 1 TABLET BY MOUTH DAILY AT 6 PM. 07/31/21   Croitoru, Dani Gobble, MD  spironolactone (ALDACTONE) 25 MG tablet Take 1 tablet (25 mg total) by mouth daily as needed (For a weight over 295 lbs). 12/18/20   Croitoru, Mihai, MD  Vitamin D, Cholecalciferol, 1000 units TABS Take 1,000 Units by mouth every morning.     [provider]  warfarin (COUMADIN) 7.5 MG tablet TAKE 1/2 TO 1 TABLET DAILY AS DIRECTED BY COUMADIN CLINIC Patient taking differently: Take 3.75-7.5 mg by mouth as directed. TAKE 1 TABLET (7.5 MG) ON (TUES & THURS) & TAKE 1/2 TABLET (3.75 MG) ALL OTHER DAYS AS DIRECTED BY COUMADIN CLINIC 05/26/21   Deberah Pelton, NP    Family History    Family History  Problem Relation Age of Onset    Hypertension Mother    Coronary artery disease Brother    Hypertension Sister    She indicated that her mother is deceased. She indicated that her father is deceased. She indicated that the status of her sister is unknown. She indicated that the status of her brother is unknown.   Social History    Social History   Socioeconomic History   Marital status: Single    Spouse name: Not on file   Number of children:  Not on file   Years of education: Not on file   Highest education level: Not on file  Occupational History   Occupation: Retired  Tobacco Use   Smoking status: Former    Packs/day: 1.00    Years: 20.00    Total pack years: 20.00    Types: Cigarettes    Quit date: 09/27/1984    Years since quitting: 38.1    Passive exposure: Never   Smokeless tobacco: Never  Vaping Use   Vaping Use: Never used  Substance and Sexual Activity   Alcohol use: No   Drug use: No   Sexual activity: Not on file  Other Topics Concern   Not on file  Social History Narrative   Patient lives alone.   Social Determinants of Health   Financial Resource Strain: Not on file  Food Insecurity: No Food Insecurity (10/02/2022)   Hunger Vital Sign    Worried About Running Out of Food in the Last Year: Never true    Ran Out of Food in the Last Year: Never true  Transportation Needs: No Transportation Needs (10/02/2022)   PRAPARE - Hydrologist (Medical): No    Lack of Transportation (Non-Medical): No  Physical Activity: Not on file  Stress: Stress Concern Present (11/14/2019)   [place]    Feeling of Stress : Very much  Social Connections: Not on file  Intimate Partner Violence: Not At Risk (10/02/2022)   Humiliation, Afraid, Rape, and Kick questionnaire    Fear of Current or Ex-Partner: No    Emotionally Abused: No    Physically Abused: No    Sexually Abused: No     Review of Systems    General:   No chills, fever, night sweats or weight changes.  Cardiovascular:  No chest pain, dyspnea on exertion, edema, orthopnea, palpitations, paroxysmal nocturnal dyspnea. Dermatological: No rash, lesions/masses Respiratory: No cough, dyspnea Urologic: No hematuria, dysuria Abdominal:   No nausea, vomiting, diarrhea, bright red blood per rectum, melena, or hematemesis Neurologic:  No visual changes, wkns, changes in mental status. All other systems reviewed and are otherwise negative except as noted above.  Physical Exam    VS:  There were no vitals taken for this visit. , BMI There is no height or weight on file to calculate BMI. GEN: Well nourished, well developed, in no acute distress. HEENT: normal. Neck: Supple, no JVD, carotid bruits, or masses. Cardiac: RRR, no murmurs, rubs, or gallops. No clubbing, cyanosis, edema.  Radials/DP/PT 2+ and equal bilaterally.  Respiratory:  Respirations regular and unlabored, clear to auscultation bilaterally. GI: Soft, nontender, nondistended, BS + x 4. MS: no deformity or atrophy. Skin: warm and dry, no rash.  1 cm x 1 cm blister left anterior shin Neuro:  Strength and sensation are intact. Psych: Normal affect.  Accessory Clinical Findings    Recent Labs: 10/01/2022: B Natriuretic Peptide 174.2 10/02/2022: ALT 16 10/04/2022: Magnesium 2.0 10/06/2022: BUN 21; Creatinine, Ser 1.31; Hemoglobin 10.0; Platelets 311; Potassium 4.5; Sodium 130   Recent Lipid Panel    Component Value Date/Time   CHOL 115 07/15/2021 0348   TRIG 59 07/15/2021 0348   HDL 44 07/15/2021 0348   CHOLHDL 2.6 07/15/2021 0348   VLDL 12 07/15/2021 0348   LDLCALC 59 07/15/2021 0348   LDLDIRECT 143 (H) 10/10/2008 2042    ECG personally reviewed by me today- normal sinus rhythm left axis deviation right bundle branch block LVH  84 BPM  Assessment & Plan   1.  ***  Jossie Ng. Krissa Utke NP-C    11/01/2022, 6:55 AM [place] Office  [phone] Fax [phone]  Notice: This dictation was prepared with Dragon dictation along with smaller phrase technology. Any transcriptional errors that result from this process are unintentional and may not be corrected upon review.  I spent 14 minutes examining this patient, reviewing medications, and using patient centered shared decision making involving her cardiac care.  Prior to her visit I spent greater than 20 minutes reviewing her past medical history,  medications, and prior cardiac tests.

## 2022-11-03 ENCOUNTER — Ambulatory Visit: Payer: HMO | Admitting: General Practice

## 2022-11-05 ENCOUNTER — Ambulatory Visit: Payer: HMO

## 2022-11-05 ENCOUNTER — Ambulatory Visit: Payer: HMO | Admitting: Orthopedic Surgery

## 2022-11-05 DIAGNOSIS — S91301D Unspecified open wound, right foot, subsequent encounter: Secondary | ICD-10-CM | POA: Diagnosis not present

## 2022-11-08 ENCOUNTER — Other Ambulatory Visit: Payer: Self-pay | Admitting: Cardiovascular Disease

## 2022-11-08 DIAGNOSIS — I480 Paroxysmal atrial fibrillation: Secondary | ICD-10-CM

## 2022-11-08 DIAGNOSIS — M6281 Muscle weakness (generalized): Secondary | ICD-10-CM | POA: Diagnosis not present

## 2022-11-08 DIAGNOSIS — M1711 Unilateral primary osteoarthritis, right knee: Secondary | ICD-10-CM | POA: Diagnosis not present

## 2022-11-08 DIAGNOSIS — I5032 Chronic diastolic (congestive) heart failure: Secondary | ICD-10-CM | POA: Diagnosis not present

## 2022-11-08 DIAGNOSIS — I5023 Acute on chronic systolic (congestive) heart failure: Secondary | ICD-10-CM | POA: Diagnosis not present

## 2022-11-08 DIAGNOSIS — J449 Chronic obstructive pulmonary disease, unspecified: Secondary | ICD-10-CM | POA: Diagnosis not present

## 2022-11-08 DIAGNOSIS — I5043 Acute on chronic combined systolic (congestive) and diastolic (congestive) heart failure: Secondary | ICD-10-CM | POA: Diagnosis not present

## 2022-11-08 DIAGNOSIS — S2232XS Fracture of one rib, left side, sequela: Secondary | ICD-10-CM | POA: Diagnosis not present

## 2022-11-08 NOTE — Telephone Encounter (Signed)
*  STAT* If patient is at the pharmacy, call can be transferred to refill team.   1. Which medications need to be refilled? (please list name of each medication and dose if known)   metoprolol tartrate (LOPRESSOR) 25 MG tablet  warfarin (COUMADIN) 7.5 MG tablet   2. Which pharmacy/location (including street and city if local pharmacy) is medication to be sent to?  [facility] [employer], [place]   3. Do they need a 30 day or 90 day supply? 90 day   Patient stated she has 10 days left of these medications.

## 2022-11-09 DIAGNOSIS — I10 Essential (primary) hypertension: Secondary | ICD-10-CM | POA: Diagnosis not present

## 2022-11-09 DIAGNOSIS — I5032 Chronic diastolic (congestive) heart failure: Secondary | ICD-10-CM | POA: Diagnosis not present

## 2022-11-09 DIAGNOSIS — E782 Mixed hyperlipidemia: Secondary | ICD-10-CM | POA: Diagnosis not present

## 2022-11-09 DIAGNOSIS — R531 Weakness: Secondary | ICD-10-CM | POA: Diagnosis not present

## 2022-11-09 DIAGNOSIS — Z09 Encounter for follow-up examination after completed treatment for conditions other than malignant neoplasm: Secondary | ICD-10-CM | POA: Diagnosis not present

## 2022-11-09 DIAGNOSIS — L8990 Pressure ulcer of unspecified site, unspecified stage: Secondary | ICD-10-CM | POA: Diagnosis not present

## 2022-11-09 DIAGNOSIS — I4821 Permanent atrial fibrillation: Secondary | ICD-10-CM | POA: Diagnosis not present

## 2022-11-09 DIAGNOSIS — I130 Hypertensive heart and chronic kidney disease with heart failure and stage 1 through stage 4 chronic kidney disease, or unspecified chronic kidney disease: Secondary | ICD-10-CM | POA: Diagnosis not present

## 2022-11-09 DIAGNOSIS — D6869 Other thrombophilia: Secondary | ICD-10-CM | POA: Diagnosis not present

## 2022-11-09 DIAGNOSIS — E1142 Type 2 diabetes mellitus with diabetic polyneuropathy: Secondary | ICD-10-CM | POA: Diagnosis not present

## 2022-11-09 MED ORDER — METOPROLOL TARTRATE 25 MG PO TABS: ORAL_TABLET | ORAL | 1 refills | Status: DC

## 2022-11-09 NOTE — Telephone Encounter (Signed)
Prescription refill request received for warfarin [place]: 06/24/22 (Croitoru)  Next INR check: 10/19/22 Warfarin tablet strength: 7.5mg   Overdue for Coumadin Clinic appt.  Called and spoke with pt to make her aware. Pt stated she has been in a rehab facility and was recently discharged on 10/28/22. Pt stated she has issues with transportation and has her INR check scheduled on the same day as her appt with Dr Jacquiline Doe. I educated pt on the importance of coming to Coumadin Clinic to check INR sooner; however pt wishes to keep INR appt on 12/14/22. Pt states she still has 30 Warfarin tablets left and does NOT need Warfarin refill until she comes to Coumadin Clinic.

## 2022-11-13 DIAGNOSIS — K219 Gastro-esophageal reflux disease without esophagitis: Secondary | ICD-10-CM | POA: Diagnosis not present

## 2022-11-13 DIAGNOSIS — E1151 Type 2 diabetes mellitus with diabetic peripheral angiopathy without gangrene: Secondary | ICD-10-CM | POA: Diagnosis not present

## 2022-11-13 DIAGNOSIS — M1711 Unilateral primary osteoarthritis, right knee: Secondary | ICD-10-CM | POA: Diagnosis not present

## 2022-11-13 DIAGNOSIS — S2232XD Fracture of one rib, left side, subsequent encounter for fracture with routine healing: Secondary | ICD-10-CM | POA: Diagnosis not present

## 2022-11-13 DIAGNOSIS — I5032 Chronic diastolic (congestive) heart failure: Secondary | ICD-10-CM | POA: Diagnosis not present

## 2022-11-13 DIAGNOSIS — Z90710 Acquired absence of both cervix and uterus: Secondary | ICD-10-CM | POA: Diagnosis not present

## 2022-11-13 DIAGNOSIS — L89612 Pressure ulcer of right heel, stage 2: Secondary | ICD-10-CM | POA: Diagnosis not present

## 2022-11-13 DIAGNOSIS — Z9049 Acquired absence of other specified parts of digestive tract: Secondary | ICD-10-CM | POA: Diagnosis not present

## 2022-11-13 DIAGNOSIS — Z794 Long term (current) use of insulin: Secondary | ICD-10-CM | POA: Diagnosis not present

## 2022-11-13 DIAGNOSIS — I4821 Permanent atrial fibrillation: Secondary | ICD-10-CM | POA: Diagnosis not present

## 2022-11-13 DIAGNOSIS — Z6841 Body Mass Index (BMI) 40.0 and over, adult: Secondary | ICD-10-CM | POA: Diagnosis not present

## 2022-11-13 DIAGNOSIS — E782 Mixed hyperlipidemia: Secondary | ICD-10-CM | POA: Diagnosis not present

## 2022-11-13 DIAGNOSIS — N183 Chronic kidney disease, stage 3 unspecified: Secondary | ICD-10-CM | POA: Diagnosis not present

## 2022-11-13 DIAGNOSIS — Z7901 Long term (current) use of anticoagulants: Secondary | ICD-10-CM | POA: Diagnosis not present

## 2022-11-13 DIAGNOSIS — I130 Hypertensive heart and chronic kidney disease with heart failure and stage 1 through stage 4 chronic kidney disease, or unspecified chronic kidney disease: Secondary | ICD-10-CM | POA: Diagnosis not present

## 2022-11-13 DIAGNOSIS — Z951 Presence of aortocoronary bypass graft: Secondary | ICD-10-CM | POA: Diagnosis not present

## 2022-11-13 DIAGNOSIS — E1142 Type 2 diabetes mellitus with diabetic polyneuropathy: Secondary | ICD-10-CM | POA: Diagnosis not present

## 2022-11-13 DIAGNOSIS — I2510 Atherosclerotic heart disease of native coronary artery without angina pectoris: Secondary | ICD-10-CM | POA: Diagnosis not present

## 2022-11-13 DIAGNOSIS — E1122 Type 2 diabetes mellitus with diabetic chronic kidney disease: Secondary | ICD-10-CM | POA: Diagnosis not present

## 2022-11-13 DIAGNOSIS — Z96652 Presence of left artificial knee joint: Secondary | ICD-10-CM | POA: Diagnosis not present

## 2022-11-13 DIAGNOSIS — Z9181 History of falling: Secondary | ICD-10-CM | POA: Diagnosis not present

## 2022-11-15 ENCOUNTER — Telehealth: Payer: Self-pay | Admitting: Cardiovascular Disease

## 2022-11-15 DIAGNOSIS — Z9181 History of falling: Secondary | ICD-10-CM | POA: Diagnosis not present

## 2022-11-15 DIAGNOSIS — I509 Heart failure, unspecified: Secondary | ICD-10-CM | POA: Diagnosis not present

## 2022-11-15 DIAGNOSIS — I480 Paroxysmal atrial fibrillation: Secondary | ICD-10-CM | POA: Diagnosis not present

## 2022-11-15 NOTE — Telephone Encounter (Signed)
*  STAT* If patient is at the pharmacy, call can be transferred to refill team.   1. Which medications need to be refilled? (please list name of each medication and dose if known)  metoprolol succinate (TOPROL-XL) 50 MG 24 hr tablet   2. Which pharmacy/location (including street and city if local pharmacy) is medication to be sent to? [facility] [employer], [place]   3. Do they need a 30 day or 90 day supply? 90 day supply   Only has 9 tablets left.

## 2022-11-16 DIAGNOSIS — I2510 Atherosclerotic heart disease of native coronary artery without angina pectoris: Secondary | ICD-10-CM | POA: Diagnosis not present

## 2022-11-16 DIAGNOSIS — Z96652 Presence of left artificial knee joint: Secondary | ICD-10-CM | POA: Diagnosis not present

## 2022-11-16 DIAGNOSIS — Z794 Long term (current) use of insulin: Secondary | ICD-10-CM | POA: Diagnosis not present

## 2022-11-16 DIAGNOSIS — Z90710 Acquired absence of both cervix and uterus: Secondary | ICD-10-CM | POA: Diagnosis not present

## 2022-11-16 DIAGNOSIS — Z6841 Body Mass Index (BMI) 40.0 and over, adult: Secondary | ICD-10-CM | POA: Diagnosis not present

## 2022-11-16 DIAGNOSIS — M1711 Unilateral primary osteoarthritis, right knee: Secondary | ICD-10-CM | POA: Diagnosis not present

## 2022-11-16 DIAGNOSIS — K219 Gastro-esophageal reflux disease without esophagitis: Secondary | ICD-10-CM | POA: Diagnosis not present

## 2022-11-16 DIAGNOSIS — E1151 Type 2 diabetes mellitus with diabetic peripheral angiopathy without gangrene: Secondary | ICD-10-CM | POA: Diagnosis not present

## 2022-11-16 DIAGNOSIS — Z9049 Acquired absence of other specified parts of digestive tract: Secondary | ICD-10-CM | POA: Diagnosis not present

## 2022-11-16 DIAGNOSIS — L89612 Pressure ulcer of right heel, stage 2: Secondary | ICD-10-CM | POA: Diagnosis not present

## 2022-11-16 DIAGNOSIS — Z951 Presence of aortocoronary bypass graft: Secondary | ICD-10-CM | POA: Diagnosis not present

## 2022-11-16 DIAGNOSIS — I4821 Permanent atrial fibrillation: Secondary | ICD-10-CM | POA: Diagnosis not present

## 2022-11-16 DIAGNOSIS — I130 Hypertensive heart and chronic kidney disease with heart failure and stage 1 through stage 4 chronic kidney disease, or unspecified chronic kidney disease: Secondary | ICD-10-CM | POA: Diagnosis not present

## 2022-11-16 DIAGNOSIS — E1122 Type 2 diabetes mellitus with diabetic chronic kidney disease: Secondary | ICD-10-CM | POA: Diagnosis not present

## 2022-11-16 DIAGNOSIS — Z9181 History of falling: Secondary | ICD-10-CM | POA: Diagnosis not present

## 2022-11-16 DIAGNOSIS — E1142 Type 2 diabetes mellitus with diabetic polyneuropathy: Secondary | ICD-10-CM | POA: Diagnosis not present

## 2022-11-16 DIAGNOSIS — Z7901 Long term (current) use of anticoagulants: Secondary | ICD-10-CM | POA: Diagnosis not present

## 2022-11-16 DIAGNOSIS — N183 Chronic kidney disease, stage 3 unspecified: Secondary | ICD-10-CM | POA: Diagnosis not present

## 2022-11-16 DIAGNOSIS — E782 Mixed hyperlipidemia: Secondary | ICD-10-CM | POA: Diagnosis not present

## 2022-11-16 DIAGNOSIS — S2232XD Fracture of one rib, left side, subsequent encounter for fracture with routine healing: Secondary | ICD-10-CM | POA: Diagnosis not present

## 2022-11-16 DIAGNOSIS — I5032 Chronic diastolic (congestive) heart failure: Secondary | ICD-10-CM | POA: Diagnosis not present

## 2022-11-19 DIAGNOSIS — E782 Mixed hyperlipidemia: Secondary | ICD-10-CM | POA: Diagnosis not present

## 2022-11-19 DIAGNOSIS — M1711 Unilateral primary osteoarthritis, right knee: Secondary | ICD-10-CM | POA: Diagnosis not present

## 2022-11-19 DIAGNOSIS — Z794 Long term (current) use of insulin: Secondary | ICD-10-CM | POA: Diagnosis not present

## 2022-11-19 DIAGNOSIS — Z6841 Body Mass Index (BMI) 40.0 and over, adult: Secondary | ICD-10-CM | POA: Diagnosis not present

## 2022-11-19 DIAGNOSIS — E1151 Type 2 diabetes mellitus with diabetic peripheral angiopathy without gangrene: Secondary | ICD-10-CM | POA: Diagnosis not present

## 2022-11-19 DIAGNOSIS — S2232XD Fracture of one rib, left side, subsequent encounter for fracture with routine healing: Secondary | ICD-10-CM | POA: Diagnosis not present

## 2022-11-19 DIAGNOSIS — Z9049 Acquired absence of other specified parts of digestive tract: Secondary | ICD-10-CM | POA: Diagnosis not present

## 2022-11-19 DIAGNOSIS — Z90710 Acquired absence of both cervix and uterus: Secondary | ICD-10-CM | POA: Diagnosis not present

## 2022-11-19 DIAGNOSIS — Z951 Presence of aortocoronary bypass graft: Secondary | ICD-10-CM | POA: Diagnosis not present

## 2022-11-19 DIAGNOSIS — Z96652 Presence of left artificial knee joint: Secondary | ICD-10-CM | POA: Diagnosis not present

## 2022-11-19 DIAGNOSIS — I4821 Permanent atrial fibrillation: Secondary | ICD-10-CM | POA: Diagnosis not present

## 2022-11-19 DIAGNOSIS — I130 Hypertensive heart and chronic kidney disease with heart failure and stage 1 through stage 4 chronic kidney disease, or unspecified chronic kidney disease: Secondary | ICD-10-CM | POA: Diagnosis not present

## 2022-11-19 DIAGNOSIS — I2510 Atherosclerotic heart disease of native coronary artery without angina pectoris: Secondary | ICD-10-CM | POA: Diagnosis not present

## 2022-11-19 DIAGNOSIS — K219 Gastro-esophageal reflux disease without esophagitis: Secondary | ICD-10-CM | POA: Diagnosis not present

## 2022-11-19 DIAGNOSIS — N183 Chronic kidney disease, stage 3 unspecified: Secondary | ICD-10-CM | POA: Diagnosis not present

## 2022-11-19 DIAGNOSIS — L89612 Pressure ulcer of right heel, stage 2: Secondary | ICD-10-CM | POA: Diagnosis not present

## 2022-11-19 DIAGNOSIS — Z7901 Long term (current) use of anticoagulants: Secondary | ICD-10-CM | POA: Diagnosis not present

## 2022-11-19 DIAGNOSIS — Z9181 History of falling: Secondary | ICD-10-CM | POA: Diagnosis not present

## 2022-11-19 DIAGNOSIS — E1122 Type 2 diabetes mellitus with diabetic chronic kidney disease: Secondary | ICD-10-CM | POA: Diagnosis not present

## 2022-11-19 DIAGNOSIS — E1142 Type 2 diabetes mellitus with diabetic polyneuropathy: Secondary | ICD-10-CM | POA: Diagnosis not present

## 2022-11-19 DIAGNOSIS — I5032 Chronic diastolic (congestive) heart failure: Secondary | ICD-10-CM | POA: Diagnosis not present

## 2022-11-22 ENCOUNTER — Telehealth: Payer: Self-pay | Admitting: Cardiovascular Disease

## 2022-11-22 DIAGNOSIS — I5032 Chronic diastolic (congestive) heart failure: Secondary | ICD-10-CM | POA: Diagnosis not present

## 2022-11-22 DIAGNOSIS — E1122 Type 2 diabetes mellitus with diabetic chronic kidney disease: Secondary | ICD-10-CM | POA: Diagnosis not present

## 2022-11-22 DIAGNOSIS — L89612 Pressure ulcer of right heel, stage 2: Secondary | ICD-10-CM | POA: Diagnosis not present

## 2022-11-22 DIAGNOSIS — I130 Hypertensive heart and chronic kidney disease with heart failure and stage 1 through stage 4 chronic kidney disease, or unspecified chronic kidney disease: Secondary | ICD-10-CM | POA: Diagnosis not present

## 2022-11-22 MED ORDER — METOPROLOL SUCCINATE ER 50 MG PO TB24: 50.0000 mg | ORAL_TABLET | Freq: Every day | ORAL | 1 refills | Status: DC

## 2022-11-22 NOTE — Telephone Encounter (Signed)
Refills has been sent to the pharmacy. 

## 2022-11-22 NOTE — Telephone Encounter (Signed)
*  STAT* If patient is at the pharmacy, call can be transferred to refill team.   1. Which medications need to be refilled? (please list name of each medication and dose if known) metoprolol succinate (TOPROL-XL) 50 MG 24 hr tablet   2. Which pharmacy/location (including street and city if local pharmacy) is medication to be sent to?[id number] [place]   3. Do they need a 30 day or 90 day supply? [place]

## 2022-11-24 DIAGNOSIS — I130 Hypertensive heart and chronic kidney disease with heart failure and stage 1 through stage 4 chronic kidney disease, or unspecified chronic kidney disease: Secondary | ICD-10-CM | POA: Diagnosis not present

## 2022-11-24 DIAGNOSIS — E1151 Type 2 diabetes mellitus with diabetic peripheral angiopathy without gangrene: Secondary | ICD-10-CM | POA: Diagnosis not present

## 2022-11-24 DIAGNOSIS — E1122 Type 2 diabetes mellitus with diabetic chronic kidney disease: Secondary | ICD-10-CM | POA: Diagnosis not present

## 2022-11-24 DIAGNOSIS — L89612 Pressure ulcer of right heel, stage 2: Secondary | ICD-10-CM | POA: Diagnosis not present

## 2022-11-24 DIAGNOSIS — I4821 Permanent atrial fibrillation: Secondary | ICD-10-CM | POA: Diagnosis not present

## 2022-11-24 DIAGNOSIS — Z7901 Long term (current) use of anticoagulants: Secondary | ICD-10-CM | POA: Diagnosis not present

## 2022-11-24 DIAGNOSIS — E1142 Type 2 diabetes mellitus with diabetic polyneuropathy: Secondary | ICD-10-CM | POA: Diagnosis not present

## 2022-11-24 DIAGNOSIS — M1711 Unilateral primary osteoarthritis, right knee: Secondary | ICD-10-CM | POA: Diagnosis not present

## 2022-11-24 DIAGNOSIS — Z9181 History of falling: Secondary | ICD-10-CM | POA: Diagnosis not present

## 2022-11-24 DIAGNOSIS — Z951 Presence of aortocoronary bypass graft: Secondary | ICD-10-CM | POA: Diagnosis not present

## 2022-11-24 DIAGNOSIS — E782 Mixed hyperlipidemia: Secondary | ICD-10-CM | POA: Diagnosis not present

## 2022-11-24 DIAGNOSIS — S2232XD Fracture of one rib, left side, subsequent encounter for fracture with routine healing: Secondary | ICD-10-CM | POA: Diagnosis not present

## 2022-11-24 DIAGNOSIS — Z90710 Acquired absence of both cervix and uterus: Secondary | ICD-10-CM | POA: Diagnosis not present

## 2022-11-24 DIAGNOSIS — I2510 Atherosclerotic heart disease of native coronary artery without angina pectoris: Secondary | ICD-10-CM | POA: Diagnosis not present

## 2022-11-24 DIAGNOSIS — N183 Chronic kidney disease, stage 3 unspecified: Secondary | ICD-10-CM | POA: Diagnosis not present

## 2022-11-24 DIAGNOSIS — Z96652 Presence of left artificial knee joint: Secondary | ICD-10-CM | POA: Diagnosis not present

## 2022-11-24 DIAGNOSIS — Z9049 Acquired absence of other specified parts of digestive tract: Secondary | ICD-10-CM | POA: Diagnosis not present

## 2022-11-24 DIAGNOSIS — Z6841 Body Mass Index (BMI) 40.0 and over, adult: Secondary | ICD-10-CM | POA: Diagnosis not present

## 2022-11-24 DIAGNOSIS — I5032 Chronic diastolic (congestive) heart failure: Secondary | ICD-10-CM | POA: Diagnosis not present

## 2022-11-24 DIAGNOSIS — Z794 Long term (current) use of insulin: Secondary | ICD-10-CM | POA: Diagnosis not present

## 2022-11-24 DIAGNOSIS — K219 Gastro-esophageal reflux disease without esophagitis: Secondary | ICD-10-CM | POA: Diagnosis not present

## 2022-11-25 DIAGNOSIS — M6281 Muscle weakness (generalized): Secondary | ICD-10-CM | POA: Diagnosis not present

## 2022-11-25 DIAGNOSIS — E1065 Type 1 diabetes mellitus with hyperglycemia: Secondary | ICD-10-CM | POA: Diagnosis not present

## 2022-11-25 DIAGNOSIS — I130 Hypertensive heart and chronic kidney disease with heart failure and stage 1 through stage 4 chronic kidney disease, or unspecified chronic kidney disease: Secondary | ICD-10-CM | POA: Diagnosis not present

## 2022-11-25 DIAGNOSIS — M1711 Unilateral primary osteoarthritis, right knee: Secondary | ICD-10-CM | POA: Diagnosis not present

## 2022-11-25 DIAGNOSIS — S2232XS Fracture of one rib, left side, sequela: Secondary | ICD-10-CM | POA: Diagnosis not present

## 2022-11-25 DIAGNOSIS — E1059 Type 1 diabetes mellitus with other circulatory complications: Secondary | ICD-10-CM | POA: Diagnosis not present

## 2022-11-25 DIAGNOSIS — I5023 Acute on chronic systolic (congestive) heart failure: Secondary | ICD-10-CM | POA: Diagnosis not present

## 2022-11-25 DIAGNOSIS — J449 Chronic obstructive pulmonary disease, unspecified: Secondary | ICD-10-CM | POA: Diagnosis not present

## 2022-11-25 DIAGNOSIS — I5043 Acute on chronic combined systolic (congestive) and diastolic (congestive) heart failure: Secondary | ICD-10-CM | POA: Diagnosis not present

## 2022-11-25 DIAGNOSIS — I5032 Chronic diastolic (congestive) heart failure: Secondary | ICD-10-CM | POA: Diagnosis not present

## 2022-11-25 DIAGNOSIS — I10 Essential (primary) hypertension: Secondary | ICD-10-CM | POA: Diagnosis not present

## 2022-11-26 DIAGNOSIS — I130 Hypertensive heart and chronic kidney disease with heart failure and stage 1 through stage 4 chronic kidney disease, or unspecified chronic kidney disease: Secondary | ICD-10-CM | POA: Diagnosis not present

## 2022-11-26 DIAGNOSIS — E1142 Type 2 diabetes mellitus with diabetic polyneuropathy: Secondary | ICD-10-CM | POA: Diagnosis not present

## 2022-11-26 DIAGNOSIS — M1711 Unilateral primary osteoarthritis, right knee: Secondary | ICD-10-CM | POA: Diagnosis not present

## 2022-11-26 DIAGNOSIS — E782 Mixed hyperlipidemia: Secondary | ICD-10-CM | POA: Diagnosis not present

## 2022-11-26 DIAGNOSIS — Z951 Presence of aortocoronary bypass graft: Secondary | ICD-10-CM | POA: Diagnosis not present

## 2022-11-26 DIAGNOSIS — Z9049 Acquired absence of other specified parts of digestive tract: Secondary | ICD-10-CM | POA: Diagnosis not present

## 2022-11-26 DIAGNOSIS — N183 Chronic kidney disease, stage 3 unspecified: Secondary | ICD-10-CM | POA: Diagnosis not present

## 2022-11-26 DIAGNOSIS — K219 Gastro-esophageal reflux disease without esophagitis: Secondary | ICD-10-CM | POA: Diagnosis not present

## 2022-11-26 DIAGNOSIS — Z6841 Body Mass Index (BMI) 40.0 and over, adult: Secondary | ICD-10-CM | POA: Diagnosis not present

## 2022-11-26 DIAGNOSIS — Z7901 Long term (current) use of anticoagulants: Secondary | ICD-10-CM | POA: Diagnosis not present

## 2022-11-26 DIAGNOSIS — E1122 Type 2 diabetes mellitus with diabetic chronic kidney disease: Secondary | ICD-10-CM | POA: Diagnosis not present

## 2022-11-26 DIAGNOSIS — I4821 Permanent atrial fibrillation: Secondary | ICD-10-CM | POA: Diagnosis not present

## 2022-11-26 DIAGNOSIS — I5032 Chronic diastolic (congestive) heart failure: Secondary | ICD-10-CM | POA: Diagnosis not present

## 2022-11-26 DIAGNOSIS — S2232XD Fracture of one rib, left side, subsequent encounter for fracture with routine healing: Secondary | ICD-10-CM | POA: Diagnosis not present

## 2022-11-26 DIAGNOSIS — L89612 Pressure ulcer of right heel, stage 2: Secondary | ICD-10-CM | POA: Diagnosis not present

## 2022-11-26 DIAGNOSIS — Z9181 History of falling: Secondary | ICD-10-CM | POA: Diagnosis not present

## 2022-11-26 DIAGNOSIS — Z90710 Acquired absence of both cervix and uterus: Secondary | ICD-10-CM | POA: Diagnosis not present

## 2022-11-26 DIAGNOSIS — Z794 Long term (current) use of insulin: Secondary | ICD-10-CM | POA: Diagnosis not present

## 2022-11-26 DIAGNOSIS — I2510 Atherosclerotic heart disease of native coronary artery without angina pectoris: Secondary | ICD-10-CM | POA: Diagnosis not present

## 2022-11-26 DIAGNOSIS — E1151 Type 2 diabetes mellitus with diabetic peripheral angiopathy without gangrene: Secondary | ICD-10-CM | POA: Diagnosis not present

## 2022-11-26 DIAGNOSIS — Z96652 Presence of left artificial knee joint: Secondary | ICD-10-CM | POA: Diagnosis not present

## 2022-12-01 DIAGNOSIS — E1151 Type 2 diabetes mellitus with diabetic peripheral angiopathy without gangrene: Secondary | ICD-10-CM | POA: Diagnosis not present

## 2022-12-01 DIAGNOSIS — L84 Corns and callosities: Secondary | ICD-10-CM | POA: Diagnosis not present

## 2022-12-01 DIAGNOSIS — M2041 Other hammer toe(s) (acquired), right foot: Secondary | ICD-10-CM | POA: Diagnosis not present

## 2022-12-01 DIAGNOSIS — B351 Tinea unguium: Secondary | ICD-10-CM | POA: Diagnosis not present

## 2022-12-01 DIAGNOSIS — M24571 Contracture, right ankle: Secondary | ICD-10-CM | POA: Diagnosis not present

## 2022-12-01 DIAGNOSIS — M79675 Pain in left toe(s): Secondary | ICD-10-CM | POA: Diagnosis not present

## 2022-12-02 DIAGNOSIS — L89612 Pressure ulcer of right heel, stage 2: Secondary | ICD-10-CM | POA: Diagnosis not present

## 2022-12-03 DIAGNOSIS — M1711 Unilateral primary osteoarthritis, right knee: Secondary | ICD-10-CM | POA: Diagnosis not present

## 2022-12-03 DIAGNOSIS — S2232XD Fracture of one rib, left side, subsequent encounter for fracture with routine healing: Secondary | ICD-10-CM | POA: Diagnosis not present

## 2022-12-03 DIAGNOSIS — Z9049 Acquired absence of other specified parts of digestive tract: Secondary | ICD-10-CM | POA: Diagnosis not present

## 2022-12-03 DIAGNOSIS — N183 Chronic kidney disease, stage 3 unspecified: Secondary | ICD-10-CM | POA: Diagnosis not present

## 2022-12-03 DIAGNOSIS — I4821 Permanent atrial fibrillation: Secondary | ICD-10-CM | POA: Diagnosis not present

## 2022-12-03 DIAGNOSIS — I5032 Chronic diastolic (congestive) heart failure: Secondary | ICD-10-CM | POA: Diagnosis not present

## 2022-12-03 DIAGNOSIS — Z9181 History of falling: Secondary | ICD-10-CM | POA: Diagnosis not present

## 2022-12-03 DIAGNOSIS — Z6841 Body Mass Index (BMI) 40.0 and over, adult: Secondary | ICD-10-CM | POA: Diagnosis not present

## 2022-12-03 DIAGNOSIS — Z7901 Long term (current) use of anticoagulants: Secondary | ICD-10-CM | POA: Diagnosis not present

## 2022-12-03 DIAGNOSIS — E1122 Type 2 diabetes mellitus with diabetic chronic kidney disease: Secondary | ICD-10-CM | POA: Diagnosis not present

## 2022-12-03 DIAGNOSIS — I130 Hypertensive heart and chronic kidney disease with heart failure and stage 1 through stage 4 chronic kidney disease, or unspecified chronic kidney disease: Secondary | ICD-10-CM | POA: Diagnosis not present

## 2022-12-03 DIAGNOSIS — I2510 Atherosclerotic heart disease of native coronary artery without angina pectoris: Secondary | ICD-10-CM | POA: Diagnosis not present

## 2022-12-03 DIAGNOSIS — Z794 Long term (current) use of insulin: Secondary | ICD-10-CM | POA: Diagnosis not present

## 2022-12-03 DIAGNOSIS — K219 Gastro-esophageal reflux disease without esophagitis: Secondary | ICD-10-CM | POA: Diagnosis not present

## 2022-12-03 DIAGNOSIS — E782 Mixed hyperlipidemia: Secondary | ICD-10-CM | POA: Diagnosis not present

## 2022-12-03 DIAGNOSIS — Z96652 Presence of left artificial knee joint: Secondary | ICD-10-CM | POA: Diagnosis not present

## 2022-12-03 DIAGNOSIS — Z951 Presence of aortocoronary bypass graft: Secondary | ICD-10-CM | POA: Diagnosis not present

## 2022-12-03 DIAGNOSIS — E1142 Type 2 diabetes mellitus with diabetic polyneuropathy: Secondary | ICD-10-CM | POA: Diagnosis not present

## 2022-12-03 DIAGNOSIS — E1151 Type 2 diabetes mellitus with diabetic peripheral angiopathy without gangrene: Secondary | ICD-10-CM | POA: Diagnosis not present

## 2022-12-03 DIAGNOSIS — Z90710 Acquired absence of both cervix and uterus: Secondary | ICD-10-CM | POA: Diagnosis not present

## 2022-12-03 DIAGNOSIS — L89612 Pressure ulcer of right heel, stage 2: Secondary | ICD-10-CM | POA: Diagnosis not present

## 2022-12-07 DIAGNOSIS — M1711 Unilateral primary osteoarthritis, right knee: Secondary | ICD-10-CM | POA: Diagnosis not present

## 2022-12-07 DIAGNOSIS — I5032 Chronic diastolic (congestive) heart failure: Secondary | ICD-10-CM | POA: Diagnosis not present

## 2022-12-07 DIAGNOSIS — I5043 Acute on chronic combined systolic (congestive) and diastolic (congestive) heart failure: Secondary | ICD-10-CM | POA: Diagnosis not present

## 2022-12-07 DIAGNOSIS — J449 Chronic obstructive pulmonary disease, unspecified: Secondary | ICD-10-CM | POA: Diagnosis not present

## 2022-12-07 DIAGNOSIS — I5023 Acute on chronic systolic (congestive) heart failure: Secondary | ICD-10-CM | POA: Diagnosis not present

## 2022-12-07 DIAGNOSIS — M6281 Muscle weakness (generalized): Secondary | ICD-10-CM | POA: Diagnosis not present

## 2022-12-07 DIAGNOSIS — S2232XS Fracture of one rib, left side, sequela: Secondary | ICD-10-CM | POA: Diagnosis not present

## 2022-12-08 DIAGNOSIS — Z951 Presence of aortocoronary bypass graft: Secondary | ICD-10-CM | POA: Diagnosis not present

## 2022-12-08 DIAGNOSIS — S2232XD Fracture of one rib, left side, subsequent encounter for fracture with routine healing: Secondary | ICD-10-CM | POA: Diagnosis not present

## 2022-12-08 DIAGNOSIS — M1711 Unilateral primary osteoarthritis, right knee: Secondary | ICD-10-CM | POA: Diagnosis not present

## 2022-12-08 DIAGNOSIS — Z7901 Long term (current) use of anticoagulants: Secondary | ICD-10-CM | POA: Diagnosis not present

## 2022-12-08 DIAGNOSIS — K219 Gastro-esophageal reflux disease without esophagitis: Secondary | ICD-10-CM | POA: Diagnosis not present

## 2022-12-08 DIAGNOSIS — I2510 Atherosclerotic heart disease of native coronary artery without angina pectoris: Secondary | ICD-10-CM | POA: Diagnosis not present

## 2022-12-08 DIAGNOSIS — Z9181 History of falling: Secondary | ICD-10-CM | POA: Diagnosis not present

## 2022-12-08 DIAGNOSIS — E1142 Type 2 diabetes mellitus with diabetic polyneuropathy: Secondary | ICD-10-CM | POA: Diagnosis not present

## 2022-12-08 DIAGNOSIS — L89612 Pressure ulcer of right heel, stage 2: Secondary | ICD-10-CM | POA: Diagnosis not present

## 2022-12-08 DIAGNOSIS — E782 Mixed hyperlipidemia: Secondary | ICD-10-CM | POA: Diagnosis not present

## 2022-12-08 DIAGNOSIS — I130 Hypertensive heart and chronic kidney disease with heart failure and stage 1 through stage 4 chronic kidney disease, or unspecified chronic kidney disease: Secondary | ICD-10-CM | POA: Diagnosis not present

## 2022-12-08 DIAGNOSIS — Z9049 Acquired absence of other specified parts of digestive tract: Secondary | ICD-10-CM | POA: Diagnosis not present

## 2022-12-08 DIAGNOSIS — E1122 Type 2 diabetes mellitus with diabetic chronic kidney disease: Secondary | ICD-10-CM | POA: Diagnosis not present

## 2022-12-08 DIAGNOSIS — I5032 Chronic diastolic (congestive) heart failure: Secondary | ICD-10-CM | POA: Diagnosis not present

## 2022-12-08 DIAGNOSIS — N183 Chronic kidney disease, stage 3 unspecified: Secondary | ICD-10-CM | POA: Diagnosis not present

## 2022-12-08 DIAGNOSIS — E1151 Type 2 diabetes mellitus with diabetic peripheral angiopathy without gangrene: Secondary | ICD-10-CM | POA: Diagnosis not present

## 2022-12-08 DIAGNOSIS — Z96652 Presence of left artificial knee joint: Secondary | ICD-10-CM | POA: Diagnosis not present

## 2022-12-08 DIAGNOSIS — Z794 Long term (current) use of insulin: Secondary | ICD-10-CM | POA: Diagnosis not present

## 2022-12-08 DIAGNOSIS — Z6841 Body Mass Index (BMI) 40.0 and over, adult: Secondary | ICD-10-CM | POA: Diagnosis not present

## 2022-12-08 DIAGNOSIS — Z90710 Acquired absence of both cervix and uterus: Secondary | ICD-10-CM | POA: Diagnosis not present

## 2022-12-08 DIAGNOSIS — I4821 Permanent atrial fibrillation: Secondary | ICD-10-CM | POA: Diagnosis not present

## 2022-12-09 DIAGNOSIS — I130 Hypertensive heart and chronic kidney disease with heart failure and stage 1 through stage 4 chronic kidney disease, or unspecified chronic kidney disease: Secondary | ICD-10-CM | POA: Diagnosis not present

## 2022-12-09 DIAGNOSIS — R531 Weakness: Secondary | ICD-10-CM | POA: Diagnosis not present

## 2022-12-09 DIAGNOSIS — D6869 Other thrombophilia: Secondary | ICD-10-CM | POA: Diagnosis not present

## 2022-12-09 DIAGNOSIS — E1142 Type 2 diabetes mellitus with diabetic polyneuropathy: Secondary | ICD-10-CM | POA: Diagnosis not present

## 2022-12-09 DIAGNOSIS — I4821 Permanent atrial fibrillation: Secondary | ICD-10-CM | POA: Diagnosis not present

## 2022-12-09 DIAGNOSIS — E782 Mixed hyperlipidemia: Secondary | ICD-10-CM | POA: Diagnosis not present

## 2022-12-09 DIAGNOSIS — R269 Unspecified abnormalities of gait and mobility: Secondary | ICD-10-CM | POA: Diagnosis not present

## 2022-12-09 DIAGNOSIS — E1065 Type 1 diabetes mellitus with hyperglycemia: Secondary | ICD-10-CM | POA: Diagnosis not present

## 2022-12-09 DIAGNOSIS — I10 Essential (primary) hypertension: Secondary | ICD-10-CM | POA: Diagnosis not present

## 2022-12-13 DIAGNOSIS — I10 Essential (primary) hypertension: Secondary | ICD-10-CM | POA: Diagnosis not present

## 2022-12-13 DIAGNOSIS — Z794 Long term (current) use of insulin: Secondary | ICD-10-CM | POA: Diagnosis not present

## 2022-12-13 DIAGNOSIS — Z515 Encounter for palliative care: Secondary | ICD-10-CM | POA: Diagnosis not present

## 2022-12-13 DIAGNOSIS — Z6841 Body Mass Index (BMI) 40.0 and over, adult: Secondary | ICD-10-CM | POA: Diagnosis not present

## 2022-12-13 DIAGNOSIS — E1165 Type 2 diabetes mellitus with hyperglycemia: Secondary | ICD-10-CM | POA: Diagnosis not present

## 2022-12-13 DIAGNOSIS — Z87891 Personal history of nicotine dependence: Secondary | ICD-10-CM | POA: Diagnosis not present

## 2022-12-14 ENCOUNTER — Ambulatory Visit: Payer: HMO | Admitting: Cardiovascular Disease

## 2022-12-14 ENCOUNTER — Ambulatory Visit: Payer: HMO

## 2022-12-16 DIAGNOSIS — Z0000 Encounter for general adult medical examination without abnormal findings: Secondary | ICD-10-CM | POA: Diagnosis not present

## 2022-12-16 DIAGNOSIS — E10319 Type 1 diabetes mellitus with unspecified diabetic retinopathy without macular edema: Secondary | ICD-10-CM | POA: Diagnosis not present

## 2022-12-16 DIAGNOSIS — D6869 Other thrombophilia: Secondary | ICD-10-CM | POA: Diagnosis not present

## 2022-12-16 DIAGNOSIS — I2510 Atherosclerotic heart disease of native coronary artery without angina pectoris: Secondary | ICD-10-CM | POA: Diagnosis not present

## 2022-12-16 DIAGNOSIS — I4821 Permanent atrial fibrillation: Secondary | ICD-10-CM | POA: Diagnosis not present

## 2022-12-16 DIAGNOSIS — E782 Mixed hyperlipidemia: Secondary | ICD-10-CM | POA: Diagnosis not present

## 2022-12-16 DIAGNOSIS — I129 Hypertensive chronic kidney disease with stage 1 through stage 4 chronic kidney disease, or unspecified chronic kidney disease: Secondary | ICD-10-CM | POA: Diagnosis not present

## 2022-12-16 DIAGNOSIS — N1831 Chronic kidney disease, stage 3a: Secondary | ICD-10-CM | POA: Diagnosis not present

## 2022-12-16 DIAGNOSIS — I130 Hypertensive heart and chronic kidney disease with heart failure and stage 1 through stage 4 chronic kidney disease, or unspecified chronic kidney disease: Secondary | ICD-10-CM | POA: Diagnosis not present

## 2022-12-16 DIAGNOSIS — I10 Essential (primary) hypertension: Secondary | ICD-10-CM | POA: Diagnosis not present

## 2022-12-16 DIAGNOSIS — I700 Atherosclerosis of aorta: Secondary | ICD-10-CM | POA: Diagnosis not present

## 2022-12-16 DIAGNOSIS — D692 Other nonthrombocytopenic purpura: Secondary | ICD-10-CM | POA: Diagnosis not present

## 2022-12-21 ENCOUNTER — Ambulatory Visit: Payer: HMO

## 2022-12-21 ENCOUNTER — Ambulatory Visit: Payer: HMO | Attending: Cardiology | Admitting: *Deleted

## 2022-12-21 DIAGNOSIS — I480 Paroxysmal atrial fibrillation: Secondary | ICD-10-CM

## 2022-12-21 DIAGNOSIS — Z7901 Long term (current) use of anticoagulants: Secondary | ICD-10-CM

## 2022-12-21 LAB — POCT INR: POC INR: 5.1

## 2022-12-21 NOTE — Patient Instructions (Signed)
Description   Hold warfarin today and tomorrow Then START taking warfarin 1/2 a tablet daily except for 1 tablet on Sunday, Tuesday and Thursday. Recheck INR in 1 week.  Coumadin Clinic [phone]

## 2022-12-26 DIAGNOSIS — S2232XS Fracture of one rib, left side, sequela: Secondary | ICD-10-CM | POA: Diagnosis not present

## 2022-12-26 DIAGNOSIS — I5032 Chronic diastolic (congestive) heart failure: Secondary | ICD-10-CM | POA: Diagnosis not present

## 2022-12-26 DIAGNOSIS — J449 Chronic obstructive pulmonary disease, unspecified: Secondary | ICD-10-CM | POA: Diagnosis not present

## 2022-12-26 DIAGNOSIS — I5043 Acute on chronic combined systolic (congestive) and diastolic (congestive) heart failure: Secondary | ICD-10-CM | POA: Diagnosis not present

## 2022-12-26 DIAGNOSIS — M1711 Unilateral primary osteoarthritis, right knee: Secondary | ICD-10-CM | POA: Diagnosis not present

## 2022-12-26 DIAGNOSIS — M6281 Muscle weakness (generalized): Secondary | ICD-10-CM | POA: Diagnosis not present

## 2022-12-26 DIAGNOSIS — I5023 Acute on chronic systolic (congestive) heart failure: Secondary | ICD-10-CM | POA: Diagnosis not present

## 2022-12-30 ENCOUNTER — Ambulatory Visit: Payer: HMO | Attending: Cardiovascular Disease

## 2022-12-30 DIAGNOSIS — I480 Paroxysmal atrial fibrillation: Secondary | ICD-10-CM

## 2022-12-30 DIAGNOSIS — Z7901 Long term (current) use of anticoagulants: Secondary | ICD-10-CM

## 2022-12-30 LAB — POCT INR: INR: 2 (ref 2.0–3.0)

## 2022-12-30 MED ORDER — WARFARIN SODIUM 7.5 MG PO TABS: ORAL_TABLET | ORAL | 1 refills | Status: DC

## 2022-12-30 NOTE — Patient Instructions (Addendum)
Description   Take 1.5 tablets today and then continue taking warfarin 1/2 a tablet daily except for 1 tablet on Sunday, Tuesday and Thursday.  Recheck INR in 2 week.   Coumadin Clinic [phone]

## 2023-01-07 DIAGNOSIS — Z794 Long term (current) use of insulin: Secondary | ICD-10-CM | POA: Diagnosis not present

## 2023-01-07 DIAGNOSIS — E782 Mixed hyperlipidemia: Secondary | ICD-10-CM | POA: Diagnosis not present

## 2023-01-07 DIAGNOSIS — N183 Chronic kidney disease, stage 3 unspecified: Secondary | ICD-10-CM | POA: Diagnosis not present

## 2023-01-07 DIAGNOSIS — E1151 Type 2 diabetes mellitus with diabetic peripheral angiopathy without gangrene: Secondary | ICD-10-CM | POA: Diagnosis not present

## 2023-01-07 DIAGNOSIS — L89612 Pressure ulcer of right heel, stage 2: Secondary | ICD-10-CM | POA: Diagnosis not present

## 2023-01-07 DIAGNOSIS — K219 Gastro-esophageal reflux disease without esophagitis: Secondary | ICD-10-CM | POA: Diagnosis not present

## 2023-01-07 DIAGNOSIS — E1122 Type 2 diabetes mellitus with diabetic chronic kidney disease: Secondary | ICD-10-CM | POA: Diagnosis not present

## 2023-01-07 DIAGNOSIS — I4821 Permanent atrial fibrillation: Secondary | ICD-10-CM | POA: Diagnosis not present

## 2023-01-07 DIAGNOSIS — Z951 Presence of aortocoronary bypass graft: Secondary | ICD-10-CM | POA: Diagnosis not present

## 2023-01-07 DIAGNOSIS — I2510 Atherosclerotic heart disease of native coronary artery without angina pectoris: Secondary | ICD-10-CM | POA: Diagnosis not present

## 2023-01-07 DIAGNOSIS — Z96652 Presence of left artificial knee joint: Secondary | ICD-10-CM | POA: Diagnosis not present

## 2023-01-07 DIAGNOSIS — E1142 Type 2 diabetes mellitus with diabetic polyneuropathy: Secondary | ICD-10-CM | POA: Diagnosis not present

## 2023-01-07 DIAGNOSIS — S2232XS Fracture of one rib, left side, sequela: Secondary | ICD-10-CM | POA: Diagnosis not present

## 2023-01-07 DIAGNOSIS — Z6841 Body Mass Index (BMI) 40.0 and over, adult: Secondary | ICD-10-CM | POA: Diagnosis not present

## 2023-01-07 DIAGNOSIS — Z90710 Acquired absence of both cervix and uterus: Secondary | ICD-10-CM | POA: Diagnosis not present

## 2023-01-07 DIAGNOSIS — M1711 Unilateral primary osteoarthritis, right knee: Secondary | ICD-10-CM | POA: Diagnosis not present

## 2023-01-07 DIAGNOSIS — I130 Hypertensive heart and chronic kidney disease with heart failure and stage 1 through stage 4 chronic kidney disease, or unspecified chronic kidney disease: Secondary | ICD-10-CM | POA: Diagnosis not present

## 2023-01-07 DIAGNOSIS — I5023 Acute on chronic systolic (congestive) heart failure: Secondary | ICD-10-CM | POA: Diagnosis not present

## 2023-01-07 DIAGNOSIS — S2232XD Fracture of one rib, left side, subsequent encounter for fracture with routine healing: Secondary | ICD-10-CM | POA: Diagnosis not present

## 2023-01-07 DIAGNOSIS — Z9181 History of falling: Secondary | ICD-10-CM | POA: Diagnosis not present

## 2023-01-07 DIAGNOSIS — Z9049 Acquired absence of other specified parts of digestive tract: Secondary | ICD-10-CM | POA: Diagnosis not present

## 2023-01-07 DIAGNOSIS — I5032 Chronic diastolic (congestive) heart failure: Secondary | ICD-10-CM | POA: Diagnosis not present

## 2023-01-07 DIAGNOSIS — Z7901 Long term (current) use of anticoagulants: Secondary | ICD-10-CM | POA: Diagnosis not present

## 2023-01-07 DIAGNOSIS — J449 Chronic obstructive pulmonary disease, unspecified: Secondary | ICD-10-CM | POA: Diagnosis not present

## 2023-01-07 DIAGNOSIS — M6281 Muscle weakness (generalized): Secondary | ICD-10-CM | POA: Diagnosis not present

## 2023-01-07 DIAGNOSIS — I5043 Acute on chronic combined systolic (congestive) and diastolic (congestive) heart failure: Secondary | ICD-10-CM | POA: Diagnosis not present

## 2023-01-09 NOTE — Progress Notes (Deleted)
Cardiology Clinic Note   Date: 01/09/2023 ID: Eileene, Yzquierdo 31-Jul-1951, MRN [id number]  Primary Cardiologist:  Thurmon Fair, MD  Patient Profile    [name] is a 72 y.o. female who presents to the clinic today for 64-month follow-up.  Past medical history significant for: CAD. LHC 11/04/2008 (unstable angina): Angiographically patent right coronary artery, left main coronary artery, circumflex coronary artery.  30% LAD just beyond diagonal branch. LHC 04/07/2012 (NSTEMI): Severe discrete proximal stenosis of LAD 99%.  Anterior wall hypokinesis, EF 40 to 45%.  LVEDP 24 mmHg.  PCI with DES to proximal LAD. LHC 05/27/2014 (unstable angina): In-stent restenosis of proximal LAD stent.  LCx 50 to 60% mid AV groove.  Cutting Balloon PTCA and DES to proximal LAD. LHC 02/05/2016 (positive nuclear stress test): In-stent restenosis proximal LAD and previously placed overlapping DES x 2.  Proximal Cx 55%.  Ramus 20%.  Cutting Balloon PTCA and DES to proximal LAD. LHC 08/11/2017 (unstable angina): Severe in-stent restenosis proximal LAD.  CTS consult. CABG x 1 08/17/2017: LIMA to LAD. Ischemic cardiomyopathy. EF 40 to 45% July 2013 with recovered LV function (EF 55 to 60% per echo) October 2013. Chronic diastolic heart failure. Echo 07/15/2021: EF 55 to 60%.  Moderate asymmetric LVH septal segment.  Indeterminate diastolic parameters.  Mildly reduced RV systolic function.  Mild RVH.  Mild RAE.  Moderate TR.  Mild aortic valve sclerosis without stenosis. PAF/a-flutter. DCCV 07/16/2021: Successful. RBBB. PAD. S/p left SFA atherectomy 2012. ABI 06/25/2022: Bilateral ABI indicates noncompressible lower extremity arteries.  Abnormal TBI bilaterally. Hypertension. Hyperlipidemia. COPD. Lung CA. GERD. Insulin-dependent T2DM. Lymphedema. Chronic venous insufficiency.   History of Present Illness    [name] is a longtime patient of cardiology followed by Dr. Royann Shivers for  the above outlined history.  Patient was last seen in the office by Dr. Royann Shivers on 06/24/2022.  She was doing well at that time and no changes were made.  More recently, patient presented to the ED via EMS from home on 09/29/2022 with generalized weakness and bilateral lower leg edema.  This chronic problem was reported as worsening over the past week.  It was felt there was no indication to admit patient to hospital.  Patient was discharged home with plan for home health and a consult with social work for potential SNF placement.  Unfortunately, patient returned to the ED via EMS on 10/01/2022 with lower extremity pain and several falls at home.  Patient was admitted to undergo IV diuresis for tachypnea and workup for pulmonary edema versus CHF and anasarca evaluation.  Patient was discharged home on 09/16/2023 to rehab facility.  Today, patient ***  CAD.  Patient with multiple interventions secondary to in-stent restenosis of proximal LAD x 3.  S/p CABG x 1 in November 2018.  Patient*** Continue metoprolol and simvastatin.  Patient not on aspirin secondary to Coumadin. Chronic diastolic heart failure.  Echo October 2022 with normal LV function.  Patient*** Euvolemic and well compensated on exam.  Continue Lasix, lisinopril/hydrochlorothiazide, metoprolol, spironolactone. PAF/A-flutter.  DCCV October 2022.  Patient*** Continue metoprolol succinate daily with metoprolol tartrate as needed HR >120 bpm. PAD.  S/p left SFA atherectomy 2012.  ABI September 2023 indicated noncompressible lower extremity arteries bilaterally with abnormal TBI's bilaterally.  Patient*** Continue simvastatin. Hypertension.  BP today*** Patient denies headaches or dizziness.  Continue lisinopril/hydrochlorothiazide and metoprolol. Hyperlipidemia.***  ROS: All other systems reviewed and are otherwise negative except as noted in History of Present Illness.  Studies  Reviewed    ECG personally reviewed by me today: ***  No  significant changes from ***  Risk Assessment/Calculations    {Does this patient have ATRIAL FIBRILLATION?:[phone]} No BP recorded.  {Refresh Note OR Click here to enter BP  :1}***        Physical Exam    VS:  There were no vitals taken for this visit. , BMI There is no height or weight on file to calculate BMI.  GEN: Well nourished, well developed, in no acute distress. Neck: No JVD or carotid bruits. Cardiac: *** RRR. No murmurs. No rubs or gallops.   Respiratory:  Respirations regular and unlabored. Clear to auscultation without rales, wheezing or rhonchi. GI: Soft, nontender, nondistended. Extremities: Radials/DP/PT 2+ and equal bilaterally. No clubbing or cyanosis. No edema ***  Skin: Warm and dry, no rash. Neuro: Strength intact.  Assessment & Plan   ***  Disposition: ***     {Are you ordering a CV Procedure (e.g. stress test, cath, DCCV, TEE, etc)?   Press F2        :[id number]}   Signed, Etta Grandchild. Avish Torry, DNP, NP-C

## 2023-01-11 ENCOUNTER — Ambulatory Visit: Payer: HMO | Admitting: Student

## 2023-01-11 ENCOUNTER — Ambulatory Visit: Payer: HMO

## 2023-01-12 DIAGNOSIS — E139 Other specified diabetes mellitus without complications: Secondary | ICD-10-CM | POA: Diagnosis not present

## 2023-01-12 DIAGNOSIS — E10319 Type 1 diabetes mellitus with unspecified diabetic retinopathy without macular edema: Secondary | ICD-10-CM | POA: Diagnosis not present

## 2023-01-12 DIAGNOSIS — I482 Chronic atrial fibrillation, unspecified: Secondary | ICD-10-CM | POA: Diagnosis not present

## 2023-01-19 ENCOUNTER — Ambulatory Visit: Payer: HMO | Attending: Internal Medicine | Admitting: Pharmacist

## 2023-01-19 DIAGNOSIS — I4892 Unspecified atrial flutter: Secondary | ICD-10-CM

## 2023-01-19 DIAGNOSIS — Z7901 Long term (current) use of anticoagulants: Secondary | ICD-10-CM

## 2023-01-19 DIAGNOSIS — I480 Paroxysmal atrial fibrillation: Secondary | ICD-10-CM | POA: Diagnosis not present

## 2023-01-19 LAB — POCT INR: INR: 2.8 (ref 2.0–3.0)

## 2023-01-19 NOTE — Patient Instructions (Signed)
Description   Take 1.5 tablets today and then continue taking warfarin 1/2 a tablet daily except for 1 tablet on Sunday, Tuesday and Thursday.  Recheck INR in 4 weeks [facility] [phone]

## 2023-01-25 DIAGNOSIS — I5043 Acute on chronic combined systolic (congestive) and diastolic (congestive) heart failure: Secondary | ICD-10-CM | POA: Diagnosis not present

## 2023-01-25 DIAGNOSIS — S2232XS Fracture of one rib, left side, sequela: Secondary | ICD-10-CM | POA: Diagnosis not present

## 2023-01-25 DIAGNOSIS — I5032 Chronic diastolic (congestive) heart failure: Secondary | ICD-10-CM | POA: Diagnosis not present

## 2023-01-25 DIAGNOSIS — J449 Chronic obstructive pulmonary disease, unspecified: Secondary | ICD-10-CM | POA: Diagnosis not present

## 2023-01-25 DIAGNOSIS — M6281 Muscle weakness (generalized): Secondary | ICD-10-CM | POA: Diagnosis not present

## 2023-01-25 DIAGNOSIS — I5023 Acute on chronic systolic (congestive) heart failure: Secondary | ICD-10-CM | POA: Diagnosis not present

## 2023-01-25 DIAGNOSIS — M1711 Unilateral primary osteoarthritis, right knee: Secondary | ICD-10-CM | POA: Diagnosis not present

## 2023-02-06 DIAGNOSIS — J449 Chronic obstructive pulmonary disease, unspecified: Secondary | ICD-10-CM | POA: Diagnosis not present

## 2023-02-06 DIAGNOSIS — I5023 Acute on chronic systolic (congestive) heart failure: Secondary | ICD-10-CM | POA: Diagnosis not present

## 2023-02-06 DIAGNOSIS — I5032 Chronic diastolic (congestive) heart failure: Secondary | ICD-10-CM | POA: Diagnosis not present

## 2023-02-06 DIAGNOSIS — M6281 Muscle weakness (generalized): Secondary | ICD-10-CM | POA: Diagnosis not present

## 2023-02-06 DIAGNOSIS — I5043 Acute on chronic combined systolic (congestive) and diastolic (congestive) heart failure: Secondary | ICD-10-CM | POA: Diagnosis not present

## 2023-02-06 DIAGNOSIS — S2232XS Fracture of one rib, left side, sequela: Secondary | ICD-10-CM | POA: Diagnosis not present

## 2023-02-06 DIAGNOSIS — M1711 Unilateral primary osteoarthritis, right knee: Secondary | ICD-10-CM | POA: Diagnosis not present

## 2023-02-11 DIAGNOSIS — I5032 Chronic diastolic (congestive) heart failure: Secondary | ICD-10-CM | POA: Diagnosis not present

## 2023-02-11 DIAGNOSIS — I110 Hypertensive heart disease with heart failure: Secondary | ICD-10-CM | POA: Diagnosis not present

## 2023-02-11 DIAGNOSIS — Z515 Encounter for palliative care: Secondary | ICD-10-CM | POA: Diagnosis not present

## 2023-02-11 DIAGNOSIS — Z87891 Personal history of nicotine dependence: Secondary | ICD-10-CM | POA: Diagnosis not present

## 2023-02-11 DIAGNOSIS — Z6841 Body Mass Index (BMI) 40.0 and over, adult: Secondary | ICD-10-CM | POA: Diagnosis not present

## 2023-02-11 DIAGNOSIS — Z794 Long term (current) use of insulin: Secondary | ICD-10-CM | POA: Diagnosis not present

## 2023-02-11 DIAGNOSIS — E1165 Type 2 diabetes mellitus with hyperglycemia: Secondary | ICD-10-CM | POA: Diagnosis not present

## 2023-02-17 NOTE — Progress Notes (Signed)
Cardiology Clinic Note   Date: 02/18/2023 ID: [name], [name] 11-09-1950, MRN [id number]  Primary Cardiologist:  Thurmon Fair, MD  Patient Profile    [name] is a 72 y.o. female who presents to the clinic today for hospital follow-up.  Past medical history significant for: CAD. LHC 11/04/2008 (unstable angina): Angiographically patent right coronary artery, left main coronary artery, circumflex coronary artery.  30% LAD just beyond diagonal branch. LHC 04/07/2012 (NSTEMI): Severe discrete proximal stenosis of LAD 99%.  Anterior wall hypokinesis, EF 40 to 45%.  LVEDP 24 mmHg.  PCI with DES to proximal LAD. LHC 05/27/2014 (unstable angina): In-stent restenosis of proximal LAD stent.  LCx 50 to 60% mid AV groove.  Cutting Balloon PTCA and DES to proximal LAD. LHC 02/05/2016 (positive nuclear stress test): In-stent restenosis proximal LAD and previously placed overlapping DES x 2.  Proximal Cx 55%.  Ramus 20%.  Cutting Balloon PTCA and DES to proximal LAD. LHC 08/11/2017 (unstable angina): Severe in-stent restenosis proximal LAD.  CTS consult. CABG x 1 08/17/2017: LIMA to LAD. Ischemic cardiomyopathy. EF 40 to 45% July 2013 with recovered LV function (EF 55 to 60% per echo) October 2013. Chronic diastolic heart failure. Echo 07/15/2021: EF 55 to 60%.  Moderate asymmetric LVH septal segment.  Indeterminate diastolic parameters.  Mildly reduced RV systolic function.  Mild RVH.  Mild RAE.  Moderate TR.  Mild aortic valve sclerosis without stenosis. PAF/a-flutter. DCCV 07/16/2021: Successful. RBBB. PAD. S/p left SFA atherectomy 2012. ABI 06/25/2022: Bilateral ABI indicates noncompressible lower extremity arteries.  Abnormal TBI bilaterally. Hypertension. Hyperlipidemia. COPD. Lung CA. GERD. Insulin-dependent T2DM. Lymphedema. Chronic venous insufficiency.   History of Present Illness    [name] is a longtime patient of cardiology followed by Dr. Royann Shivers for  the above outlined history.  Patient was last seen in the office by Dr. Royann Shivers on 06/24/2022.  She was doing well at that time and no changes were made.   More recently, patient presented to the ED via EMS from home on 09/29/2022 with generalized weakness and bilateral lower leg edema.  This chronic problem was reported as worsening over the past week.  It was felt there was no indication to admit patient to hospital.  Patient was discharged home with plan for home health and a consult with social work for potential SNF placement.  Unfortunately, patient returned to the ED via EMS on 10/01/2022 with lower extremity pain and several falls at home.  Patient was admitted to undergo IV diuresis for tachypnea and workup for pulmonary edema versus CHF and anasarca evaluation.  Patient was discharged home on 10/16/2022 to rehab facility.   Today, patient is here alone (her son is in the waiting room). She was discharged from rehab on 10/28/2022. She did not feel she got a lot of benefit from being in inpatient rehab.  She has been doing well at home.  Began by lifting from bed to wheelchair and bedside commode.  She is now able to walk down her hall with a walker and her son walking behind her with a wheelchair. She is working to increase that distance. She sleeps in a hospital bed but is able to sleep flat and denies PND or DOE. She reports lower extremity edema that is at its best in the morning and worse throughout the day. Her legs are typically in a dependent position throughout the day. She reports only taking Lasix and spironolactone once a week, as she tends to get up  after 11 am and does not like to take the diuretics so late. She agrees to work on taking them three days a week. She denies chest pain, tightness or pressure. No blood in stool or urine or other bleeding concerns.       ROS: All other systems reviewed and are otherwise negative except as noted in History of Present Illness.  Studies Reviewed     ECG is not ordered today.  Risk Assessment/Calculations     CHA2DS2-VASc Score = 6   This indicates a 9.7% annual risk of stroke. The patient's score is based upon: CHF History: 1 HTN History: 1 Diabetes History: 1 Stroke History: 0 Vascular Disease History: 1 Age Score: 1 Gender Score: 1             Physical Exam    VS:  BP 129/65   Pulse 79   Ht 5\' 3"  (1.6 m)   Wt 277 lb 12.8 oz (126 kg)   SpO2 97%   BMI 49.21 kg/m  , BMI Body mass index is 49.21 kg/m.  GEN: Well nourished, well developed, in no acute distress. Neck: No JVD or carotid bruits. Cardiac:  RRR. No murmurs. No rubs or gallops.   Respiratory:  Respirations regular and unlabored. Clear to auscultation without rales, wheezing or rhonchi. GI: Soft, nontender, nondistended. Extremities: Radials/DP/PT 2+ and equal bilaterally. No clubbing or cyanosis.  Mild edema bilateral ankles, trace edema pretibial bilaterally. Skin: Warm and dry, no rash. Neuro: Strength intact.  Assessment & Plan    CAD.  Patient with multiple interventions secondary to in-stent restenosis of proximal LAD x 3.  S/p CABG x 1 in November 2018.  Patient denies chest pain, pressure, or tightness. She has started walking her hallway with a walker and her son behind her with the wheel chair. She is working on increasing her walking distance. Continue metoprolol and simvastatin.  Patient not on aspirin secondary to Coumadin. Chronic diastolic heart failure.  Echo October 2022 with normal LV function.  Patient reports lower extremity edema that is best in the morning and worse throughout the day. Her legs are typically in a dependent position throughout the day. She only takes Lasix and spironolactone once a week. She has mild edema bilateral ankles and trace pretibial edema other euvolemic and well compensated on exam.  She is encouraged to take Lasix and spironolactone 3 times a week as prescribed.  Continue lisinopril/hydrochlorothiazide,  metoprolol. PAF/A-flutter.  DCCV October 2022.  Patient has palpitations.  She has not needed metoprolol tartrate in quite some time.  Denies spontaneous bleeding concerns.  RRR on exam today. Continue Coumadin, metoprolol succinate daily with metoprolol tartrate as needed HR >120 bpm. PAD.  S/p left SFA atherectomy 2012.  ABI September 2023 indicated noncompressible lower extremity arteries bilaterally with abnormal TBI's bilaterally.  Patient denies claudication.  Continue simvastatin. Hypertension.  BP today 129/65.  Patient denies headaches or dizziness.  Continue lisinopril/hydrochlorothiazide and metoprolol.  Disposition: Return in 6 months or sooner as needed.         Signed, Etta Grandchild. Katrianna Friesenhahn, DNP, NP-C

## 2023-02-18 ENCOUNTER — Ambulatory Visit: Payer: HMO | Attending: Cardiovascular Disease | Admitting: Student

## 2023-02-18 ENCOUNTER — Encounter: Payer: Self-pay | Admitting: Student

## 2023-02-18 ENCOUNTER — Ambulatory Visit (INDEPENDENT_AMBULATORY_CARE_PROVIDER_SITE_OTHER): Payer: HMO | Admitting: *Deleted

## 2023-02-18 VITALS — BP 129/65 | HR 79 | Ht 63.0 in | Wt 277.8 lb

## 2023-02-18 DIAGNOSIS — Z7901 Long term (current) use of anticoagulants: Secondary | ICD-10-CM | POA: Diagnosis not present

## 2023-02-18 DIAGNOSIS — I2510 Atherosclerotic heart disease of native coronary artery without angina pectoris: Secondary | ICD-10-CM | POA: Diagnosis not present

## 2023-02-18 DIAGNOSIS — I10 Essential (primary) hypertension: Secondary | ICD-10-CM | POA: Diagnosis not present

## 2023-02-18 DIAGNOSIS — I480 Paroxysmal atrial fibrillation: Secondary | ICD-10-CM

## 2023-02-18 DIAGNOSIS — I5032 Chronic diastolic (congestive) heart failure: Secondary | ICD-10-CM

## 2023-02-18 DIAGNOSIS — I484 Atypical atrial flutter: Secondary | ICD-10-CM | POA: Diagnosis not present

## 2023-02-18 DIAGNOSIS — I739 Peripheral vascular disease, unspecified: Secondary | ICD-10-CM

## 2023-02-18 LAB — POCT INR: INR: 2.5 (ref 2.0–3.0)

## 2023-02-18 NOTE — Patient Instructions (Signed)
Description   Continue taking warfarin 1/2 a tablet daily except for 1 tablet on Sunday, Tuesday and Thursday. Recheck INR in 4 weeks [facility] [phone]

## 2023-02-18 NOTE — Patient Instructions (Signed)
Medication Instructions:  Your physician recommends that you continue on your current medications as directed. Please refer to the Current Medication list given to you today.  *If you need a refill on your cardiac medications before your next appointment, please call your pharmacy*   Lab Work: NONE If you have labs (blood work) drawn today and your tests are completely normal, you will receive your results only by: MyChart Message (if you have MyChart) OR A paper copy in the mail If you have any lab test that is abnormal or we need to change your treatment, we will call you to review the results.   Testing/Procedures: NONE   Follow-Up: At [facility], you and your health needs are our priority.  As part of our continuing mission to provide you with exceptional heart care, we have created designated Provider Care Teams.  These Care Teams include your primary Cardiologist (physician) and Advanced Practice Providers (APPs -  Physician Assistants and Nurse Practitioners) who all work together to provide you with the care you need, when you need it.  We recommend signing up for the patient portal called "MyChart".  Sign up information is provided on this After Visit Summary.  MyChart is used to connect with patients for Virtual Visits (Telemedicine).  Patients are able to view lab/test results, encounter notes, upcoming appointments, etc.  Non-urgent messages can be sent to your provider as well.   To learn more about what you can do with MyChart, go to [email or web address].    Your next appointment:   6 month(s)  Provider:   Mihai Croitoru, MD   

## 2023-02-25 DIAGNOSIS — I5023 Acute on chronic systolic (congestive) heart failure: Secondary | ICD-10-CM | POA: Diagnosis not present

## 2023-02-25 DIAGNOSIS — I5043 Acute on chronic combined systolic (congestive) and diastolic (congestive) heart failure: Secondary | ICD-10-CM | POA: Diagnosis not present

## 2023-02-25 DIAGNOSIS — J449 Chronic obstructive pulmonary disease, unspecified: Secondary | ICD-10-CM | POA: Diagnosis not present

## 2023-02-25 DIAGNOSIS — M1711 Unilateral primary osteoarthritis, right knee: Secondary | ICD-10-CM | POA: Diagnosis not present

## 2023-02-25 DIAGNOSIS — I130 Hypertensive heart and chronic kidney disease with heart failure and stage 1 through stage 4 chronic kidney disease, or unspecified chronic kidney disease: Secondary | ICD-10-CM | POA: Diagnosis not present

## 2023-02-25 DIAGNOSIS — E1065 Type 1 diabetes mellitus with hyperglycemia: Secondary | ICD-10-CM | POA: Diagnosis not present

## 2023-02-25 DIAGNOSIS — S2232XS Fracture of one rib, left side, sequela: Secondary | ICD-10-CM | POA: Diagnosis not present

## 2023-02-25 DIAGNOSIS — M6281 Muscle weakness (generalized): Secondary | ICD-10-CM | POA: Diagnosis not present

## 2023-02-25 DIAGNOSIS — I10 Essential (primary) hypertension: Secondary | ICD-10-CM | POA: Diagnosis not present

## 2023-02-25 DIAGNOSIS — I5032 Chronic diastolic (congestive) heart failure: Secondary | ICD-10-CM | POA: Diagnosis not present

## 2023-02-25 DIAGNOSIS — E1059 Type 1 diabetes mellitus with other circulatory complications: Secondary | ICD-10-CM | POA: Diagnosis not present

## 2023-03-01 DIAGNOSIS — H26491 Other secondary cataract, right eye: Secondary | ICD-10-CM | POA: Diagnosis not present

## 2023-03-01 DIAGNOSIS — H524 Presbyopia: Secondary | ICD-10-CM | POA: Diagnosis not present

## 2023-03-01 DIAGNOSIS — E113393 Type 2 diabetes mellitus with moderate nonproliferative diabetic retinopathy without macular edema, bilateral: Secondary | ICD-10-CM | POA: Diagnosis not present

## 2023-03-01 DIAGNOSIS — H52203 Unspecified astigmatism, bilateral: Secondary | ICD-10-CM | POA: Diagnosis not present

## 2023-03-01 DIAGNOSIS — H04123 Dry eye syndrome of bilateral lacrimal glands: Secondary | ICD-10-CM | POA: Diagnosis not present

## 2023-03-01 DIAGNOSIS — H43813 Vitreous degeneration, bilateral: Secondary | ICD-10-CM | POA: Diagnosis not present

## 2023-03-09 DIAGNOSIS — I5032 Chronic diastolic (congestive) heart failure: Secondary | ICD-10-CM | POA: Diagnosis not present

## 2023-03-09 DIAGNOSIS — I5023 Acute on chronic systolic (congestive) heart failure: Secondary | ICD-10-CM | POA: Diagnosis not present

## 2023-03-09 DIAGNOSIS — M6281 Muscle weakness (generalized): Secondary | ICD-10-CM | POA: Diagnosis not present

## 2023-03-09 DIAGNOSIS — I5043 Acute on chronic combined systolic (congestive) and diastolic (congestive) heart failure: Secondary | ICD-10-CM | POA: Diagnosis not present

## 2023-03-09 DIAGNOSIS — J449 Chronic obstructive pulmonary disease, unspecified: Secondary | ICD-10-CM | POA: Diagnosis not present

## 2023-03-09 DIAGNOSIS — S2232XS Fracture of one rib, left side, sequela: Secondary | ICD-10-CM | POA: Diagnosis not present

## 2023-03-09 DIAGNOSIS — M1711 Unilateral primary osteoarthritis, right knee: Secondary | ICD-10-CM | POA: Diagnosis not present

## 2023-03-15 ENCOUNTER — Ambulatory Visit: Payer: HMO | Attending: Cardiovascular Disease

## 2023-03-15 DIAGNOSIS — I129 Hypertensive chronic kidney disease with stage 1 through stage 4 chronic kidney disease, or unspecified chronic kidney disease: Secondary | ICD-10-CM | POA: Diagnosis not present

## 2023-03-15 DIAGNOSIS — Z7901 Long term (current) use of anticoagulants: Secondary | ICD-10-CM | POA: Diagnosis not present

## 2023-03-15 DIAGNOSIS — E10319 Type 1 diabetes mellitus with unspecified diabetic retinopathy without macular edema: Secondary | ICD-10-CM | POA: Diagnosis not present

## 2023-03-15 DIAGNOSIS — I480 Paroxysmal atrial fibrillation: Secondary | ICD-10-CM | POA: Diagnosis not present

## 2023-03-15 DIAGNOSIS — E139 Other specified diabetes mellitus without complications: Secondary | ICD-10-CM | POA: Diagnosis not present

## 2023-03-15 DIAGNOSIS — E782 Mixed hyperlipidemia: Secondary | ICD-10-CM | POA: Diagnosis not present

## 2023-03-15 DIAGNOSIS — I482 Chronic atrial fibrillation, unspecified: Secondary | ICD-10-CM | POA: Diagnosis not present

## 2023-03-15 DIAGNOSIS — N1831 Chronic kidney disease, stage 3a: Secondary | ICD-10-CM | POA: Diagnosis not present

## 2023-03-15 LAB — POCT INR: INR: 2.5 (ref 2.0–3.0)

## 2023-03-15 NOTE — Patient Instructions (Signed)
-  Continue taking warfarin 1/2 a tablet daily except for 1 tablet on Sunday, Tuesday and Thursday. Recheck INR in 6 weeks. Coumadin Clinic [phone] ?

## 2023-03-23 ENCOUNTER — Other Ambulatory Visit: Payer: Self-pay | Admitting: Cardiovascular Disease

## 2023-03-27 DIAGNOSIS — I5023 Acute on chronic systolic (congestive) heart failure: Secondary | ICD-10-CM | POA: Diagnosis not present

## 2023-03-27 DIAGNOSIS — I5032 Chronic diastolic (congestive) heart failure: Secondary | ICD-10-CM | POA: Diagnosis not present

## 2023-03-27 DIAGNOSIS — M1711 Unilateral primary osteoarthritis, right knee: Secondary | ICD-10-CM | POA: Diagnosis not present

## 2023-03-27 DIAGNOSIS — I5043 Acute on chronic combined systolic (congestive) and diastolic (congestive) heart failure: Secondary | ICD-10-CM | POA: Diagnosis not present

## 2023-03-27 DIAGNOSIS — S2232XS Fracture of one rib, left side, sequela: Secondary | ICD-10-CM | POA: Diagnosis not present

## 2023-03-27 DIAGNOSIS — J449 Chronic obstructive pulmonary disease, unspecified: Secondary | ICD-10-CM | POA: Diagnosis not present

## 2023-03-27 DIAGNOSIS — M6281 Muscle weakness (generalized): Secondary | ICD-10-CM | POA: Diagnosis not present

## 2023-03-29 DIAGNOSIS — E139 Other specified diabetes mellitus without complications: Secondary | ICD-10-CM | POA: Diagnosis not present

## 2023-03-30 ENCOUNTER — Other Ambulatory Visit: Payer: Self-pay

## 2023-03-30 DIAGNOSIS — E139 Other specified diabetes mellitus without complications: Secondary | ICD-10-CM | POA: Diagnosis not present

## 2023-03-30 MED ORDER — METOPROLOL TARTRATE 25 MG PO TABS: ORAL_TABLET | ORAL | 3 refills | Status: DC

## 2023-04-08 DIAGNOSIS — I5023 Acute on chronic systolic (congestive) heart failure: Secondary | ICD-10-CM | POA: Diagnosis not present

## 2023-04-08 DIAGNOSIS — J449 Chronic obstructive pulmonary disease, unspecified: Secondary | ICD-10-CM | POA: Diagnosis not present

## 2023-04-08 DIAGNOSIS — I5043 Acute on chronic combined systolic (congestive) and diastolic (congestive) heart failure: Secondary | ICD-10-CM | POA: Diagnosis not present

## 2023-04-08 DIAGNOSIS — I5032 Chronic diastolic (congestive) heart failure: Secondary | ICD-10-CM | POA: Diagnosis not present

## 2023-04-08 DIAGNOSIS — S2232XS Fracture of one rib, left side, sequela: Secondary | ICD-10-CM | POA: Diagnosis not present

## 2023-04-08 DIAGNOSIS — M6281 Muscle weakness (generalized): Secondary | ICD-10-CM | POA: Diagnosis not present

## 2023-04-08 DIAGNOSIS — M1711 Unilateral primary osteoarthritis, right knee: Secondary | ICD-10-CM | POA: Diagnosis not present

## 2023-04-09 ENCOUNTER — Emergency Department (HOSPITAL_COMMUNITY): Payer: HMO

## 2023-04-09 ENCOUNTER — Inpatient Hospital Stay (HOSPITAL_COMMUNITY)
Admission: EM | Admit: 2023-04-09 | Discharge: 2023-04-12 | DRG: 683 | Disposition: A | Discharge status: Home Health | Payer: HMO | Attending: Internal Medicine | Admitting: Internal Medicine

## 2023-04-09 ENCOUNTER — Encounter (HOSPITAL_COMMUNITY): Payer: Self-pay

## 2023-04-09 ENCOUNTER — Other Ambulatory Visit: Payer: Self-pay

## 2023-04-09 DIAGNOSIS — I10 Essential (primary) hypertension: Secondary | ICD-10-CM | POA: Diagnosis present

## 2023-04-09 DIAGNOSIS — Z90710 Acquired absence of both cervix and uterus: Secondary | ICD-10-CM

## 2023-04-09 DIAGNOSIS — R1314 Dysphagia, pharyngoesophageal phase: Secondary | ICD-10-CM | POA: Diagnosis present

## 2023-04-09 DIAGNOSIS — Z881 Allergy status to other antibiotic agents status: Secondary | ICD-10-CM

## 2023-04-09 DIAGNOSIS — Z955 Presence of coronary angioplasty implant and graft: Secondary | ICD-10-CM | POA: Diagnosis not present

## 2023-04-09 DIAGNOSIS — G4733 Obstructive sleep apnea (adult) (pediatric): Secondary | ICD-10-CM | POA: Diagnosis present

## 2023-04-09 DIAGNOSIS — Z85118 Personal history of other malignant neoplasm of bronchus and lung: Secondary | ICD-10-CM

## 2023-04-09 DIAGNOSIS — R1013 Epigastric pain: Secondary | ICD-10-CM

## 2023-04-09 DIAGNOSIS — I2510 Atherosclerotic heart disease of native coronary artery without angina pectoris: Secondary | ICD-10-CM | POA: Diagnosis present

## 2023-04-09 DIAGNOSIS — E1165 Type 2 diabetes mellitus with hyperglycemia: Secondary | ICD-10-CM | POA: Diagnosis present

## 2023-04-09 DIAGNOSIS — I130 Hypertensive heart and chronic kidney disease with heart failure and stage 1 through stage 4 chronic kidney disease, or unspecified chronic kidney disease: Secondary | ICD-10-CM | POA: Diagnosis present

## 2023-04-09 DIAGNOSIS — K219 Gastro-esophageal reflux disease without esophagitis: Secondary | ICD-10-CM | POA: Diagnosis present

## 2023-04-09 DIAGNOSIS — R9431 Abnormal electrocardiogram [ECG] [EKG]: Secondary | ICD-10-CM | POA: Diagnosis present

## 2023-04-09 DIAGNOSIS — Z6841 Body Mass Index (BMI) 40.0 and over, adult: Secondary | ICD-10-CM

## 2023-04-09 DIAGNOSIS — Z713 Dietary counseling and surveillance: Secondary | ICD-10-CM

## 2023-04-09 DIAGNOSIS — N189 Chronic kidney disease, unspecified: Secondary | ICD-10-CM | POA: Diagnosis not present

## 2023-04-09 DIAGNOSIS — I5032 Chronic diastolic (congestive) heart failure: Secondary | ICD-10-CM | POA: Diagnosis present

## 2023-04-09 DIAGNOSIS — M170 Bilateral primary osteoarthritis of knee: Secondary | ICD-10-CM | POA: Diagnosis present

## 2023-04-09 DIAGNOSIS — K5730 Diverticulosis of large intestine without perforation or abscess without bleeding: Secondary | ICD-10-CM | POA: Diagnosis not present

## 2023-04-09 DIAGNOSIS — E7800 Pure hypercholesterolemia, unspecified: Secondary | ICD-10-CM | POA: Diagnosis present

## 2023-04-09 DIAGNOSIS — Z87891 Personal history of nicotine dependence: Secondary | ICD-10-CM

## 2023-04-09 DIAGNOSIS — D631 Anemia in chronic kidney disease: Secondary | ICD-10-CM | POA: Diagnosis present

## 2023-04-09 DIAGNOSIS — R739 Hyperglycemia, unspecified: Secondary | ICD-10-CM | POA: Diagnosis not present

## 2023-04-09 DIAGNOSIS — Z91148 Patient's other noncompliance with medication regimen for other reason: Secondary | ICD-10-CM

## 2023-04-09 DIAGNOSIS — E1122 Type 2 diabetes mellitus with diabetic chronic kidney disease: Secondary | ICD-10-CM | POA: Diagnosis present

## 2023-04-09 DIAGNOSIS — Z7985 Long-term (current) use of injectable non-insulin antidiabetic drugs: Secondary | ICD-10-CM

## 2023-04-09 DIAGNOSIS — Z7901 Long term (current) use of anticoagulants: Secondary | ICD-10-CM

## 2023-04-09 DIAGNOSIS — E1143 Type 2 diabetes mellitus with diabetic autonomic (poly)neuropathy: Secondary | ICD-10-CM | POA: Diagnosis present

## 2023-04-09 DIAGNOSIS — Z8249 Family history of ischemic heart disease and other diseases of the circulatory system: Secondary | ICD-10-CM

## 2023-04-09 DIAGNOSIS — I4510 Unspecified right bundle-branch block: Secondary | ICD-10-CM | POA: Diagnosis not present

## 2023-04-09 DIAGNOSIS — T450X6A Underdosing of antiallergic and antiemetic drugs, initial encounter: Secondary | ICD-10-CM | POA: Diagnosis present

## 2023-04-09 DIAGNOSIS — N179 Acute kidney failure, unspecified: Secondary | ICD-10-CM | POA: Diagnosis not present

## 2023-04-09 DIAGNOSIS — Z794 Long term (current) use of insulin: Secondary | ICD-10-CM

## 2023-04-09 DIAGNOSIS — E1169 Type 2 diabetes mellitus with other specified complication: Secondary | ICD-10-CM | POA: Diagnosis present

## 2023-04-09 DIAGNOSIS — N1832 Chronic kidney disease, stage 3b: Secondary | ICD-10-CM | POA: Diagnosis present

## 2023-04-09 DIAGNOSIS — I480 Paroxysmal atrial fibrillation: Secondary | ICD-10-CM | POA: Diagnosis present

## 2023-04-09 DIAGNOSIS — R109 Unspecified abdominal pain: Secondary | ICD-10-CM | POA: Diagnosis not present

## 2023-04-09 DIAGNOSIS — R197 Diarrhea, unspecified: Secondary | ICD-10-CM | POA: Diagnosis present

## 2023-04-09 DIAGNOSIS — R0789 Other chest pain: Secondary | ICD-10-CM | POA: Diagnosis present

## 2023-04-09 DIAGNOSIS — R079 Chest pain, unspecified: Secondary | ICD-10-CM | POA: Diagnosis not present

## 2023-04-09 DIAGNOSIS — E8809 Other disorders of plasma-protein metabolism, not elsewhere classified: Secondary | ICD-10-CM | POA: Diagnosis present

## 2023-04-09 DIAGNOSIS — E871 Hypo-osmolality and hyponatremia: Secondary | ICD-10-CM | POA: Diagnosis present

## 2023-04-09 DIAGNOSIS — Z9049 Acquired absence of other specified parts of digestive tract: Secondary | ICD-10-CM

## 2023-04-09 DIAGNOSIS — Z961 Presence of intraocular lens: Secondary | ICD-10-CM | POA: Diagnosis present

## 2023-04-09 DIAGNOSIS — K3184 Gastroparesis: Secondary | ICD-10-CM | POA: Diagnosis present

## 2023-04-09 DIAGNOSIS — I252 Old myocardial infarction: Secondary | ICD-10-CM

## 2023-04-09 DIAGNOSIS — Z902 Acquired absence of lung [part of]: Secondary | ICD-10-CM

## 2023-04-09 DIAGNOSIS — Z79899 Other long term (current) drug therapy: Secondary | ICD-10-CM

## 2023-04-09 DIAGNOSIS — Z951 Presence of aortocoronary bypass graft: Secondary | ICD-10-CM

## 2023-04-09 DIAGNOSIS — Z885 Allergy status to narcotic agent status: Secondary | ICD-10-CM

## 2023-04-09 DIAGNOSIS — Z96653 Presence of artificial knee joint, bilateral: Secondary | ICD-10-CM | POA: Diagnosis present

## 2023-04-09 DIAGNOSIS — E1151 Type 2 diabetes mellitus with diabetic peripheral angiopathy without gangrene: Secondary | ICD-10-CM | POA: Diagnosis present

## 2023-04-09 LAB — BASIC METABOLIC PANEL
Anion gap: 19 — ABNORMAL HIGH (ref 5–15)
BUN: 33 mg/dL — ABNORMAL HIGH (ref 8–23)
CO2: 25 mmol/L (ref 22–32)
Calcium: 10.4 mg/dL — ABNORMAL HIGH (ref 8.9–10.3)
Chloride: 91 mmol/L — ABNORMAL LOW (ref 98–111)
Creatinine, Ser: 2.58 mg/dL — ABNORMAL HIGH (ref 0.44–1.00)
GFR, Estimated: 19 mL/min — ABNORMAL LOW (ref >60)
Glucose, Bld: 262 mg/dL — ABNORMAL HIGH (ref 70–99)
Potassium: 4.1 mmol/L (ref 3.5–5.1)
Sodium: 135 mmol/L (ref 135–145)

## 2023-04-09 LAB — CBC
HCT: 35.5 % — ABNORMAL LOW (ref 36.0–46.0)
Hemoglobin: 11.7 g/dL — ABNORMAL LOW (ref 12.0–15.0)
MCH: 29.5 pg (ref 26.0–34.0)
MCHC: 33 g/dL (ref 30.0–36.0)
MCV: 89.4 fL (ref 80.0–100.0)
Platelets: 244 10*3/uL (ref 150–400)
RBC: 3.97 MIL/uL (ref 3.87–5.11)
RDW: 12.4 % (ref 11.5–15.5)
WBC: 9.6 10*3/uL (ref 4.0–10.5)
nRBC: 0 % (ref 0.0–0.2)

## 2023-04-09 LAB — TROPONIN I (HIGH SENSITIVITY): Troponin I (High Sensitivity): 10 ng/L (ref <18)

## 2023-04-09 MED ORDER — ALUM & MAG HYDROXIDE-SIMETH 200-200-20 MG/5ML PO SUSP
30.0000 mL | Freq: Once | ORAL | Status: AC
  Administered 2023-04-09: 30 mL via ORAL
  Filled 2023-04-09: qty 30

## 2023-04-09 MED ORDER — METOCLOPRAMIDE HCL 5 MG/ML IJ SOLN
20.0000 mg | Freq: Once | INTRAVENOUS | Status: AC
  Administered 2023-04-09: 20 mg via INTRAVENOUS
  Filled 2023-04-09: qty 4

## 2023-04-09 MED ORDER — LACTATED RINGERS IV BOLUS
1000.0000 mL | Freq: Once | INTRAVENOUS | Status: AC
  Administered 2023-04-10: 1000 mL via INTRAVENOUS

## 2023-04-09 MED ORDER — FAMOTIDINE 20 MG PO TABS
20.0000 mg | ORAL_TABLET | Freq: Once | ORAL | Status: AC
  Administered 2023-04-09: 20 mg via ORAL
  Filled 2023-04-09: qty 1

## 2023-04-09 MED ORDER — LIDOCAINE VISCOUS HCL 2 % MT SOLN
15.0000 mL | Freq: Once | OROMUCOSAL | Status: AC
  Administered 2023-04-09: 15 mL via ORAL
  Filled 2023-04-09: qty 15

## 2023-04-09 NOTE — ED Provider Notes (Signed)
[place] [facility] AT [facility] Provider Note  MDM   HPI/ROS:  [name] is a 72 y.o. female with a medical history as below who presents for evaluation of nausea and vomiting, and dyspepsia.  [name] reports that [name] has had "chest pain that feels like GERD."  On Tuesday [name] had a single episode of emesis, and Wednesday [name] had another episode of emesis.  [name] has not had any emesis since then.  Reports that [name] has had no bowel movement since Thursday, [name] does take Reglan on a regular basis but has not been taking it this week because [name] has been able to get into see her GI doctor.  [name] does report [name] has had poor p.o. intake because every time [name] eats [name] feels as though something is sitting in the "top of her stomach and then it comes back up."  Reports several instances of feeling stomach acid in her throat.  Denies any fevers, chills, cough.  Denies any new symptoms of angina or difficulty with walking or changes in her ability to ambulate.  Physical exam is notable for: - Abdomen soft, nontender to palpation   On my initial evaluation, patient is:  -Vital signs stable. Patient afebrile, hemodynamically stable, and non-toxic appearing. -Additional history obtained from chart review  This patient's current presentation, including their history and physical exam, is most consistent with dyspepsia vs gastroparesis. Lower suspicion for ACS, but given her history it is a distinct possibility. Differentials include bowel obstruction or gastric outlet obstruction.    Will plan for broad workup including labs, CT abdomen pelvis, famotidine and GI cocktail, Reglan.  Interpretations, interventions, and the patient's course of care are documented below.    Clinical Course as of 04/09/23 2319  Sat Apr 09, 2023  2133 Stable 14 YOF with a chief complaint of CAD s/p CABG 5 days of chest discomfort that is postprandial High risk for CAD ACS eval and reassess  [CC]   2226 DG Chest 2 View No evidence of pneumonia [BB]  2234 Troponin I (High Sensitivity): 10 [BB]  2234 Basic metabolic panel(!) AKI, baseline appears to be 1.3.  Additionally, patient appears to have an anion gap.  Will plan to give fluid resuscitation, change abdomen pelvis to scan without contrast, and add a lactic acid for further delineation of anion gap. [BB]    Clinical Course User Index [BB] Fayrene Helper, MD [CC] Glyn Ade, MD      Disposition:  Patient care handed off to oncoming team. Please see their documentation for continuation of her care.   Clinical Impression: No diagnosis found.  Rx / DC Orders ED Discharge Orders     None       The plan for this patient was discussed with Dr. Doran Durand, who voiced agreement and who oversaw evaluation and treatment of this patient.   Clinical Complexity A medically appropriate history, review of systems, and physical exam was performed.  My independent interpretations of EKG, labs, and radiology are documented in the ED course above.   If decision rules were used in this patient's evaluation, they are listed below.   Click here for ABCD2, HEART and other calculatorsREFRESH Note before signing   Patient's presentation is most consistent with acute presentation with potential threat to life or bodily function.  Medical Decision Making Amount and/or Complexity of Data Reviewed Labs: ordered. Decision-making details documented in ED Course. Radiology: ordered. Decision-making details documented in ED Course.  Risk OTC drugs. Prescription drug  management.    HPI/ROS      See MDM section for pertinent HPI and ROS. A complete ROS was performed with pertinent positives/negatives noted above.   Past Medical History:  Diagnosis Date   Arthritis    "knees" (02/05/2016)   Cancer (HCC) 5/10   LUL Vats    CHF (congestive heart failure) (HCC)    Coronary artery disease    GERD (gastroesophageal reflux  disease)    tx meds   H/O hiatal hernia    History of blood transfusion "several"   last april 2015 s/p knee surgery (02/05/2016)   Hypercholesteremia    Hypertension    Lung cancer (HCC)    2010, surgery 18% left lung no radiation or chemo   Non-STEMI (non-ST elevated myocardial infarction) (HCC) 04/07/2012   See cath results below   Paroxysmal atrial fibrillation (HCC) 04/07/2012   On Warfarin   Peripheral vascular disease (HCC) 8/12   Lt SFA PTA   Presence of stent in LAD coronary artery 04/07/2012   Xience Expedition DES 2.75 mm x 18 mm (dilated to 3.0 mm)   Sleep apnea    does not wear CPAP   Type II diabetes mellitus (HCC)    insulin dependent    Past Surgical History:  Procedure Laterality Date   ABDOMINAL HYSTERECTOMY  1990   BREAST EXCISIONAL BIOPSY Right    CARDIAC CATHETERIZATION  11/04/2008   patent RCA, LM, and Circ, nl EF   CARDIAC CATHETERIZATION  02/20/2002   patent coronaries with the only abnormality being a smooth luminla irregularity in the mid intermediate ramus branch no felt to be hemodynamically significant, nl LV   CARDIAC CATHETERIZATION N/A 02/05/2016   Procedure: Left Heart Cath and Coronary Angiography;  Surgeon: Marykay Lex, MD;  Location: [facility] INVASIVE CV LAB;  Service: Cardiovascular;  Laterality: N/A;   CARDIAC CATHETERIZATION N/A 02/05/2016   Procedure: Coronary Stent Intervention;  Surgeon: Marykay Lex, MD;  Location: [facility] INVASIVE CV LAB;  Service: Cardiovascular;  Laterality: N/A;   CARDIOVERSION N/A 07/16/2021   Procedure: CARDIOVERSION;  Surgeon: Christell Constant, MD;  Location: [facility] ENDOSCOPY;  Service: Cardiovascular;  Laterality: N/A;   CATARACT EXTRACTION W/ INTRAOCULAR LENS  IMPLANT, BILATERAL  2013   CORONARY ANGIOPLASTY WITH STENT PLACEMENT  04/07/2012   Xience Expedition DES 2.65mm x 18 mm (dilated to 3.0 mm) to the prox LAD    CORONARY ANGIOPLASTY WITH STENT PLACEMENT  2013; 2015   CORONARY ARTERY BYPASS GRAFT N/A 08/17/2017    Procedure: CORONARY ARTERY BYPASS GRAFTING (CABG) TIMES 1 USING LEFT INTERNAL MAMMARY ARTERY;  Surgeon: Loreli Slot, MD;  Location: [facility] OR;  Service: Open Heart Surgery;  Laterality: N/A;   DILATION AND CURETTAGE OF UTERUS  <1990   ESOPHAGOGASTRODUODENOSCOPY N/A 05/23/2015   Procedure: ESOPHAGOGASTRODUODENOSCOPY (EGD);  Surgeon: Jeani Hawking, MD;  Location: [facility] ENDOSCOPY;  Service: Endoscopy;  Laterality: N/A;   ESOPHAGOGASTRODUODENOSCOPY (EGD) WITH PROPOFOL N/A 06/13/2015   Procedure: ESOPHAGOGASTRODUODENOSCOPY (EGD) WITH PROPOFOL;  Surgeon: Jeani Hawking, MD;  Location: [facility] ENDOSCOPY;  Service: Endoscopy;  Laterality: N/A;   ESOPHAGOGASTRODUODENOSCOPY (EGD) WITH PROPOFOL N/A 04/07/2018   Procedure: ESOPHAGOGASTRODUODENOSCOPY (EGD) WITH PROPOFOL;  Surgeon: Jeani Hawking, MD;  Location: [facility] ENDOSCOPY;  Service: Endoscopy;  Laterality: N/A;  fluoroscopy is needed   FRACTURE SURGERY     HAMMER TOE SURGERY Bilateral 1993   with screws, on screw removed   JOINT REPLACEMENT     KNEE ARTHROSCOPY Bilateral    left x2, right x1  LAPAROSCOPIC CHOLECYSTECTOMY     LEFT HEART CATH AND CORONARY ANGIOGRAPHY N/A 08/11/2017   Procedure: LEFT HEART CATH AND CORONARY ANGIOGRAPHY;  Surgeon: Swaziland, Peter M, MD;  Location: [facility] INVASIVE CV LAB;  Service: Cardiovascular;  Laterality: N/A;   LEFT HEART CATHETERIZATION WITH CORONARY ANGIOGRAM N/A 04/07/2012   Procedure: LEFT HEART CATHETERIZATION WITH CORONARY ANGIOGRAM;  Surgeon: Runell Gess, MD;  Location: [facility] CATH LAB;  Service: Cardiovascular;  Laterality: N/A;   LEFT HEART CATHETERIZATION WITH CORONARY ANGIOGRAM N/A 05/27/2014   Procedure: LEFT HEART CATHETERIZATION WITH CORONARY ANGIOGRAM;  Surgeon: Runell Gess, MD; LAD 99% ISR, CFX 50-60%, RCA (dominant) no sig dz, EF 60%   LOWER EXTREMITY ANGIOGRAM  05/17/11   directional atherectomy to the prox L SFA using a LX Man TurboHawk, ballooned with a [place] balloon    ORIF ANKLE FRACTURE  07/09/2012    Procedure: OPEN REDUCTION INTERNAL FIXATION (ORIF) ANKLE FRACTURE;  Surgeon: Cammy Copa, MD;  Location: [facility] ORS;  Service: Orthopedics;  Laterality: Left;  open reduction internal fixation trimalleolar ankle fracture medial malleolous fixation   PERCUTANEOUS CORONARY STENT INTERVENTION (PCI-S) N/A 04/07/2012   Procedure: PERCUTANEOUS CORONARY STENT INTERVENTION (PCI-S);  Surgeon: Runell Gess, MD;  Location: [facility] CATH LAB;  Service: Cardiovascular;  Laterality: N/A;   PERCUTANEOUS CORONARY STENT INTERVENTION (PCI-S)  05/27/2014   Procedure: PERCUTANEOUS CORONARY STENT INTERVENTION (PCI-S);  Surgeon: Runell Gess, MD; 3 mm x 12 mm long Xience Xpedition DES to the proximal LAD   SAVORY DILATION N/A 06/13/2015   Procedure: SAVORY DILATION;  Surgeon: Jeani Hawking, MD;  Location: [facility] ENDOSCOPY;  Service: Endoscopy;  Laterality: N/A;   SAVORY DILATION N/A 04/07/2018   Procedure: SAVORY DILATION;  Surgeon: Jeani Hawking, MD;  Location: [facility] ENDOSCOPY;  Service: Endoscopy;  Laterality: N/A;   TEE WITHOUT CARDIOVERSION N/A 08/17/2017   Procedure: TRANSESOPHAGEAL ECHOCARDIOGRAM (TEE);  Surgeon: Loreli Slot, MD;  Location: [facility] OR;  Service: Open Heart Surgery;  Laterality: N/A;   TEE WITHOUT CARDIOVERSION N/A 07/16/2021   Procedure: TRANSESOPHAGEAL ECHOCARDIOGRAM (TEE);  Surgeon: Christell Constant, MD;  Location: [facility] ENDOSCOPY;  Service: Cardiovascular;  Laterality: N/A;   TONSILLECTOMY     TOTAL KNEE ARTHROPLASTY Left 2003   TOTAL KNEE REVISION Left 11/05/2013   Procedure: LEFT TOTAL KNEE RESECTION;  Surgeon: Shelda Pal, MD;  Location: [facility] ORS;  Service: Orthopedics;  Laterality: Left;   TOTAL KNEE REVISION Left 2007   opened in 2006 and cleaned out, 2007 revision   TOTAL KNEE REVISION Left 12/31/2013   Procedure: RE-INPLANTATION LEFT TOTAL KNEE ;  Surgeon: Shelda Pal, MD;  Location: [facility] ORS;  Service: Orthopedics;  Laterality: Left;   VIDEO ASSISTED THORACOSCOPY (VATS)/ LOBECTOMY   01/2009      Physical Exam   Vitals:   04/09/23 2115 04/09/23 2116  BP: (!) 140/60 (!) 140/60  Pulse: 84 82  Resp: 17 (!) 22  Temp:  98.3 F (36.8 C)  TempSrc:  Oral  SpO2: 100% 100%  Weight: 110.2 kg   Height: 5\' 3"  (1.6 m)     Physical Exam Vitals and nursing note reviewed.  Constitutional:      General: [name] is not in acute distress.    Appearance: [name] is well-developed. [name] is obese. [name] is not ill-appearing or toxic-appearing.  HENT:     Head: Normocephalic and atraumatic.     Nose: Nose normal.     Mouth/Throat:     Mouth: Mucous membranes are moist.  Pharynx: Oropharynx is clear.  Eyes:     Conjunctiva/sclera: Conjunctivae normal.     Pupils: Pupils are equal, round, and reactive to light.  Cardiovascular:     Rate and Rhythm: Normal rate and regular rhythm.     Heart sounds: Normal heart sounds. No murmur heard. Pulmonary:     Effort: Pulmonary effort is normal. No respiratory distress.     Breath sounds: Normal breath sounds. No decreased breath sounds, wheezing, rhonchi or rales.  Abdominal:     General: Bowel sounds are normal.     Palpations: Abdomen is soft.     Tenderness: There is no abdominal tenderness. There is no guarding or rebound. Negative signs include Murphy's sign.     Hernia: No hernia is present.  Musculoskeletal:        General: No swelling.     Cervical back: Neck supple.     Right lower leg: Edema present.     Left lower leg: Edema present.  Skin:    General: Skin is warm and dry.     Capillary Refill: Capillary refill takes less than 2 seconds.  Neurological:     Mental Status: [name] is alert and oriented to person, place, and time.  Psychiatric:        Mood and Affect: Mood normal.      Procedures   If procedures were preformed on this patient, they are listed below:  Procedures   Fayrene Helper, MD Emergency Medicine PGY-2   Please note that this documentation was produced with the assistance of voice-to-text  technology and may contain errors.    Fayrene Helper, MD 04/09/23 2322    Glyn Ade, MD 04/10/23 1752

## 2023-04-09 NOTE — ED Triage Notes (Signed)
Arrives EMS from home with c/o sternal chest pains ongoing for ~5 days.   Pain is familiar and usually intermittent. Has attempted baking soda with water and Mylanta with no improvement.   Hc of CABG in 2018, CHF, and CKD.

## 2023-04-10 ENCOUNTER — Encounter (HOSPITAL_COMMUNITY): Payer: Self-pay | Admitting: Family Medicine

## 2023-04-10 ENCOUNTER — Other Ambulatory Visit: Payer: Self-pay

## 2023-04-10 DIAGNOSIS — R1013 Epigastric pain: Secondary | ICD-10-CM | POA: Diagnosis present

## 2023-04-10 DIAGNOSIS — E1169 Type 2 diabetes mellitus with other specified complication: Secondary | ICD-10-CM

## 2023-04-10 DIAGNOSIS — R0789 Other chest pain: Secondary | ICD-10-CM | POA: Diagnosis not present

## 2023-04-10 DIAGNOSIS — R079 Chest pain, unspecified: Secondary | ICD-10-CM | POA: Diagnosis not present

## 2023-04-10 DIAGNOSIS — R197 Diarrhea, unspecified: Secondary | ICD-10-CM | POA: Diagnosis not present

## 2023-04-10 DIAGNOSIS — I5032 Chronic diastolic (congestive) heart failure: Secondary | ICD-10-CM | POA: Diagnosis not present

## 2023-04-10 DIAGNOSIS — I130 Hypertensive heart and chronic kidney disease with heart failure and stage 1 through stage 4 chronic kidney disease, or unspecified chronic kidney disease: Secondary | ICD-10-CM | POA: Diagnosis not present

## 2023-04-10 DIAGNOSIS — N189 Chronic kidney disease, unspecified: Secondary | ICD-10-CM | POA: Diagnosis not present

## 2023-04-10 DIAGNOSIS — I480 Paroxysmal atrial fibrillation: Secondary | ICD-10-CM

## 2023-04-10 DIAGNOSIS — N1832 Chronic kidney disease, stage 3b: Secondary | ICD-10-CM

## 2023-04-10 DIAGNOSIS — R112 Nausea with vomiting, unspecified: Secondary | ICD-10-CM | POA: Diagnosis not present

## 2023-04-10 DIAGNOSIS — G4733 Obstructive sleep apnea (adult) (pediatric): Secondary | ICD-10-CM | POA: Diagnosis not present

## 2023-04-10 DIAGNOSIS — E1143 Type 2 diabetes mellitus with diabetic autonomic (poly)neuropathy: Secondary | ICD-10-CM | POA: Diagnosis not present

## 2023-04-10 DIAGNOSIS — K3184 Gastroparesis: Secondary | ICD-10-CM | POA: Diagnosis not present

## 2023-04-10 DIAGNOSIS — Z955 Presence of coronary angioplasty implant and graft: Secondary | ICD-10-CM | POA: Diagnosis not present

## 2023-04-10 DIAGNOSIS — E1151 Type 2 diabetes mellitus with diabetic peripheral angiopathy without gangrene: Secondary | ICD-10-CM | POA: Diagnosis not present

## 2023-04-10 DIAGNOSIS — E785 Hyperlipidemia, unspecified: Secondary | ICD-10-CM

## 2023-04-10 DIAGNOSIS — E871 Hypo-osmolality and hyponatremia: Secondary | ICD-10-CM | POA: Diagnosis not present

## 2023-04-10 DIAGNOSIS — R9431 Abnormal electrocardiogram [ECG] [EKG]: Secondary | ICD-10-CM | POA: Diagnosis not present

## 2023-04-10 DIAGNOSIS — E7800 Pure hypercholesterolemia, unspecified: Secondary | ICD-10-CM | POA: Diagnosis not present

## 2023-04-10 DIAGNOSIS — R1314 Dysphagia, pharyngoesophageal phase: Secondary | ICD-10-CM | POA: Diagnosis not present

## 2023-04-10 DIAGNOSIS — E1122 Type 2 diabetes mellitus with diabetic chronic kidney disease: Secondary | ICD-10-CM | POA: Diagnosis not present

## 2023-04-10 DIAGNOSIS — N179 Acute kidney failure, unspecified: Principal | ICD-10-CM

## 2023-04-10 DIAGNOSIS — K219 Gastro-esophageal reflux disease without esophagitis: Secondary | ICD-10-CM | POA: Diagnosis not present

## 2023-04-10 DIAGNOSIS — Z794 Long term (current) use of insulin: Secondary | ICD-10-CM | POA: Diagnosis not present

## 2023-04-10 DIAGNOSIS — D631 Anemia in chronic kidney disease: Secondary | ICD-10-CM | POA: Diagnosis not present

## 2023-04-10 DIAGNOSIS — E1165 Type 2 diabetes mellitus with hyperglycemia: Secondary | ICD-10-CM | POA: Diagnosis not present

## 2023-04-10 DIAGNOSIS — R109 Unspecified abdominal pain: Secondary | ICD-10-CM | POA: Diagnosis not present

## 2023-04-10 DIAGNOSIS — Z6841 Body Mass Index (BMI) 40.0 and over, adult: Secondary | ICD-10-CM | POA: Diagnosis not present

## 2023-04-10 DIAGNOSIS — I10 Essential (primary) hypertension: Secondary | ICD-10-CM | POA: Diagnosis not present

## 2023-04-10 DIAGNOSIS — K5730 Diverticulosis of large intestine without perforation or abscess without bleeding: Secondary | ICD-10-CM | POA: Diagnosis not present

## 2023-04-10 DIAGNOSIS — R1310 Dysphagia, unspecified: Secondary | ICD-10-CM | POA: Diagnosis not present

## 2023-04-10 DIAGNOSIS — I2510 Atherosclerotic heart disease of native coronary artery without angina pectoris: Secondary | ICD-10-CM | POA: Diagnosis not present

## 2023-04-10 DIAGNOSIS — E8809 Other disorders of plasma-protein metabolism, not elsewhere classified: Secondary | ICD-10-CM | POA: Diagnosis not present

## 2023-04-10 LAB — BASIC METABOLIC PANEL
Anion gap: 10 (ref 5–15)
BUN: 31 mg/dL — ABNORMAL HIGH (ref 8–23)
CO2: 25 mmol/L (ref 22–32)
Calcium: 9.8 mg/dL (ref 8.9–10.3)
Chloride: 97 mmol/L — ABNORMAL LOW (ref 98–111)
Creatinine, Ser: 2.44 mg/dL — ABNORMAL HIGH (ref 0.44–1.00)
GFR, Estimated: 20 mL/min — ABNORMAL LOW (ref >60)
Glucose, Bld: 218 mg/dL — ABNORMAL HIGH (ref 70–99)
Potassium: 3.6 mmol/L (ref 3.5–5.1)
Sodium: 132 mmol/L — ABNORMAL LOW (ref 135–145)

## 2023-04-10 LAB — GLUCOSE, CAPILLARY
Glucose-Capillary: 130 mg/dL — ABNORMAL HIGH (ref 70–99)
Glucose-Capillary: 132 mg/dL — ABNORMAL HIGH (ref 70–99)
Glucose-Capillary: 175 mg/dL — ABNORMAL HIGH (ref 70–99)
Glucose-Capillary: 178 mg/dL — ABNORMAL HIGH (ref 70–99)
Glucose-Capillary: 189 mg/dL — ABNORMAL HIGH (ref 70–99)
Glucose-Capillary: 238 mg/dL — ABNORMAL HIGH (ref 70–99)

## 2023-04-10 LAB — CBC
HCT: 33.8 % — ABNORMAL LOW (ref 36.0–46.0)
Hemoglobin: 11 g/dL — ABNORMAL LOW (ref 12.0–15.0)
MCH: 28.6 pg (ref 26.0–34.0)
MCHC: 32.5 g/dL (ref 30.0–36.0)
MCV: 88 fL (ref 80.0–100.0)
Platelets: 214 10*3/uL (ref 150–400)
RBC: 3.84 MIL/uL — ABNORMAL LOW (ref 3.87–5.11)
RDW: 12.5 % (ref 11.5–15.5)
WBC: 10.2 10*3/uL (ref 4.0–10.5)
nRBC: 0 % (ref 0.0–0.2)

## 2023-04-10 LAB — BETA-HYDROXYBUTYRIC ACID: Beta-Hydroxybutyric Acid: 0.18 mmol/L (ref 0.05–0.27)

## 2023-04-10 LAB — LACTIC ACID, PLASMA: Lactic Acid, Venous: 1.5 mmol/L (ref 0.5–1.9)

## 2023-04-10 LAB — PROTIME-INR
INR: 2.5 — ABNORMAL HIGH (ref 0.8–1.2)
Prothrombin Time: 27.6 seconds — ABNORMAL HIGH (ref 11.4–15.2)

## 2023-04-10 LAB — HEPATIC FUNCTION PANEL
ALT: 16 U/L (ref 0–44)
AST: 20 U/L (ref 15–41)
Albumin: 3.3 g/dL — ABNORMAL LOW (ref 3.5–5.0)
Alkaline Phosphatase: 77 U/L (ref 38–126)
Bilirubin, Direct: 0.1 mg/dL (ref 0.0–0.2)
Indirect Bilirubin: 0.7 mg/dL (ref 0.3–0.9)
Total Bilirubin: 0.8 mg/dL (ref 0.3–1.2)
Total Protein: 7.5 g/dL (ref 6.5–8.1)

## 2023-04-10 LAB — C DIFFICILE QUICK SCREEN W PCR REFLEX
C Diff antigen: NEGATIVE
C Diff interpretation: NOT DETECTED
C Diff toxin: NEGATIVE

## 2023-04-10 LAB — HEMOGLOBIN A1C
Hgb A1c MFr Bld: 8.6 % — ABNORMAL HIGH (ref 4.8–5.6)
Mean Plasma Glucose: 200.12 mg/dL

## 2023-04-10 LAB — LIPASE, BLOOD: Lipase: 63 U/L — ABNORMAL HIGH (ref 11–51)

## 2023-04-10 LAB — TROPONIN I (HIGH SENSITIVITY): Troponin I (High Sensitivity): 8 ng/L (ref <18)

## 2023-04-10 MED ORDER — PANTOPRAZOLE SODIUM 40 MG PO TBEC
40.0000 mg | DELAYED_RELEASE_TABLET | Freq: Two times a day (BID) | ORAL | Status: DC
  Administered 2023-04-10 – 2023-04-12 (×5): 40 mg via ORAL
  Filled 2023-04-10 (×5): qty 1

## 2023-04-10 MED ORDER — INSULIN ASPART 100 UNIT/ML IJ SOLN
0.0000 [IU] | INTRAMUSCULAR | Status: DC
  Administered 2023-04-10 (×2): 1 [IU] via SUBCUTANEOUS
  Administered 2023-04-10: 2 [IU] via SUBCUTANEOUS
  Administered 2023-04-10: 1 [IU] via SUBCUTANEOUS
  Administered 2023-04-11: 2 [IU] via SUBCUTANEOUS
  Administered 2023-04-11: 1 [IU] via SUBCUTANEOUS

## 2023-04-10 MED ORDER — METOPROLOL SUCCINATE ER 50 MG PO TB24
50.0000 mg | ORAL_TABLET | Freq: Every day | ORAL | Status: DC
  Administered 2023-04-10 – 2023-04-12 (×3): 50 mg via ORAL
  Filled 2023-04-10 (×3): qty 1

## 2023-04-10 MED ORDER — ACETAMINOPHEN 325 MG PO TABS: 650.0000 mg | ORAL_TABLET | Freq: Four times a day (QID) | ORAL | Status: DC | PRN

## 2023-04-10 MED ORDER — ACETAMINOPHEN 650 MG RE SUPP: 650.0000 mg | Freq: Four times a day (QID) | RECTAL | Status: DC | PRN

## 2023-04-10 MED ORDER — FENTANYL CITRATE PF 50 MCG/ML IJ SOSY: 12.5000 ug | PREFILLED_SYRINGE | INTRAMUSCULAR | Status: DC | PRN

## 2023-04-10 MED ORDER — WARFARIN - PHARMACIST DOSING INPATIENT: Freq: Every day | Status: DC

## 2023-04-10 MED ORDER — PANTOPRAZOLE SODIUM 40 MG PO TBEC: 40.0000 mg | DELAYED_RELEASE_TABLET | Freq: Every evening | ORAL | Status: DC

## 2023-04-10 MED ORDER — INSULIN GLARGINE-YFGN 100 UNIT/ML ~~LOC~~ SOLN
10.0000 [IU] | Freq: Every day | SUBCUTANEOUS | Status: DC
  Administered 2023-04-10 – 2023-04-12 (×3): 10 [IU] via SUBCUTANEOUS
  Filled 2023-04-10 (×5): qty 0.1

## 2023-04-10 MED ORDER — PROCHLORPERAZINE EDISYLATE 10 MG/2ML IJ SOLN: 5.0000 mg | Freq: Four times a day (QID) | INTRAMUSCULAR | Status: DC | PRN

## 2023-04-10 MED ORDER — WARFARIN SODIUM 7.5 MG PO TABS
7.5000 mg | ORAL_TABLET | Freq: Once | ORAL | Status: AC
  Administered 2023-04-10: 7.5 mg via ORAL
  Filled 2023-04-10: qty 1

## 2023-04-10 MED ORDER — SODIUM CHLORIDE 0.9 % IV SOLN: INTRAVENOUS | Status: AC

## 2023-04-10 MED ORDER — ORAL CARE MOUTH RINSE: 15.0000 mL | OROMUCOSAL | Status: DC | PRN

## 2023-04-10 MED ORDER — ROSUVASTATIN CALCIUM 5 MG PO TABS
10.0000 mg | ORAL_TABLET | Freq: Every day | ORAL | Status: DC
  Administered 2023-04-10 – 2023-04-12 (×3): 10 mg via ORAL
  Filled 2023-04-10 (×3): qty 2

## 2023-04-10 NOTE — Progress Notes (Signed)
PROGRESS NOTE    [name]  EAV:[id number] DOB: 06/05/1951 DOA: 04/09/2023 PCP: Georgianne Fick, MD   Brief Narrative:  Care started prior to midnight in the emergency room patient was admitted early this morning after midnight by Dr. Marcial Pacas Opyd and I am in current agreement with his assessment and plan.  Additional changes to the plan of care been made accordingly.  The patient is a pleasant 72 year old African-American female with past medical history significant for but not limited to insulin-dependent diabetes mellitus type 2, CAD status post CABG chronic heart failure with preserved ejection fraction and diastolic dysfunction as well as history of atrial fibrillation on anticoagulation with warfarin, OSA not on CPAP as well as history of CKD stage IIIb and other comorbidities who presented with epigastric pain, nausea and vomiting.  She reports a long history of acid reflux and takes Protonix daily and uses over-the-counter antiacid as needed.  She reports that she has a history of gastroparesis attributed to her diabetes and has been previously taking Reglan regularly but ran out.  Had an EGD done in 2019 with a benign-appearing stenosis that was dilated as well as a hiatal hernia and a normal-appearing stomach and duodenum.  She continued to have epigastric pain, nausea vomiting which she experienced in the past with as having it last longer and has an appointment to see her GI doctor Dr. [name] on 04/13/2013.  She came to the ED and she was afebrile and was found to have an AKI and no other acute findings on chest x-ray or CT scan of the abdomen pelvis.  She was given a 1 L bolus of LR, Pepcid, GI cocktail and Reglan and admitted for further evaluation.  She was placed on a clear liquid diet and will advance diet slowly and continue to hydrate the patient with normal saline at 100 MLS per hour for 15 hours total.  Assessment and Plan:  AKI on CKD Stage 3b -BUN/Cr Trend: Recent Labs   Lab 04/09/23 2120 04/10/23 0235  BUN 33* 31*  CREATININE 2.58* 2.44*  -C/w NS at 100 mL/hr at 15 mL/hr -Avoid Nephrotoxic Medications, Contrast Dyes, Hypotension and Dehydration to Ensure Adequate Renal Perfusion and will need to Renally Adjust Meds -Continue to Monitor and Trend Renal Function carefully and repeat CMP in the AM   Hyponatremia -Na+ Trend:  Recent Labs  Lab 04/09/23 2120 04/10/23 0235  NA 135 132*  -C/w NS at 100 mL/hr -Continue to Monitor and Trend and repeat CMP in the AM  Abdominal pain, N/V and Diarrhea -No acute findings on CT and showed "No acute abnormality in the abdomen or pelvis. Aortic Atherosclerosis"; exam benign  -She reports hx of GERD and gastroparesis and is scheduled to see GI on 04/14/23  -Check LFTs and lipas -Allow clears and advance as tolerated -Continue symptomatic treatments with IVF and Antiemetics -Checking C Difficile per Protocol    CAD status post CABG -No anginal complaints   -Continue beta-blocker and statin     PAF  -Continue warfarin with Pharmacy to Dose -C/w Metoprolol    Chronic HFpEF  -Holding diuretics as above  -Getting Gentle IVF Hydration with -Strict I's and O's and Daily Weights;  Intake/Output Summary (Last 24 hours) at 04/10/2023 1524 Last data filed at 04/10/2023 1300 Gross per 24 hour  Intake 532.84 ml  Output 750 ml  Net -217.16 ml  -Continue to Monitor for S/Sx of Volume Overload   Hypertension  -Hold lisinopril-hydrochlorothiazide as above  -Continue to  Monitor and BP per Protocol  -Last BP reading was 132/66  Uncontrolled Insulin Dependent Diabetes Mellitus Type 2 -HbA1c was 8.6 this visit and was 8.8 in January 2024 -CBG and Glucose Trend: Recent Labs  Lab 04/09/23 2120 04/10/23 0228 04/10/23 0235 04/10/23 0729  GLUCOSE 262*  --  218*  --   GLUCAP  --    < >  --  130*   < > = values in this interval not displayed.  -C/w Semglee 10 units sq Daily and Very Sensitive Novolog Insulin 0-6  units  Normocytic Anemia -Hgb/Hct Trend: Recent Labs  Lab 04/09/23 2120 04/10/23 0235  HGB 11.7* 11.0*  HCT 35.5* 33.8*  MCV 89.4 88.0  -Check Anemia Panel in the AM -Continue to Monitor for S/Sx of Bleeding; No overt bleeding noted -Repeat CBC in the AM   GERD/GI Prophylaxis and Gastroparesis -C/w Pantoprazole 40 mg po BID -Given Metaclopramide 20 mg IV x1 -May discuss with her Primary GI Physician   Hypoalbuminemia -Patient's Albumin Trend: Recent Labs  Lab 04/10/23 0235  ALBUMIN 3.3*  -Continue to Monitor and Trend and repeat CMP in the AM  Morbid Obesity -Complicates overall prognosis and care -Estimated body mass index is 43.05 kg/m as calculated from the following:   Height as of this encounter: 5\' 3"  (1.6 m).   Weight as of this encounter: 110.2 kg.  -Weight Loss and Dietary Counseling given   Antimicrobials:  Anti-infectives (From admission, onward)    None       Objective: Vitals:   04/10/23 0000 04/10/23 0119 04/10/23 0220 04/10/23 0855  BP:  (!) 145/64 (!) 138/49 132/66  Pulse: 97 87 79 79  Resp: 17 17  18   Temp:  97.6 F (36.4 C) (!) 97.5 F (36.4 C) 98 F (36.7 C)  TempSrc:  Oral Oral Oral  SpO2: 100% 100% 100% 100%  Weight:      Height:        Intake/Output Summary (Last 24 hours) at 04/10/2023 1535 Last data filed at 04/10/2023 1300 Gross per 24 hour  Intake 532.84 ml  Output 750 ml  Net -217.16 ml   Filed Weights   04/09/23 2115  Weight: 110.2 kg   CBC: Recent Labs  Lab 04/09/23 2120 04/10/23 0235  WBC 9.6 10.2  HGB 11.7* 11.0*  HCT 35.5* 33.8*  MCV 89.4 88.0  PLT 244 214   Basic Metabolic Panel: Recent Labs  Lab 04/09/23 2120 04/10/23 0235  NA 135 132*  K 4.1 3.6  CL 91* 97*  CO2 25 25  GLUCOSE 262* 218*  BUN 33* 31*  CREATININE 2.58* 2.44*  CALCIUM 10.4* 9.8   GFR: Estimated Creatinine Clearance: 24.5 mL/min (A) (by C-G formula based on SCr of 2.44 mg/dL (H)). Liver Function Tests: Recent Labs  Lab  04/10/23 0235  AST 20  ALT 16  ALKPHOS 77  BILITOT 0.8  PROT 7.5  ALBUMIN 3.3*   Recent Labs  Lab 04/10/23 0235  LIPASE 63*   No results for input(s): "AMMONIA" in the last 168 hours. Coagulation Profile: Recent Labs  Lab 04/10/23 0235  INR 2.5*   Cardiac Enzymes: No results for input(s): "CKTOTAL", "CKMB", "CKMBINDEX", "TROPONINI" in the last 168 hours. BNP (last 3 results) No results for input(s): "PROBNP" in the last 8760 hours. HbA1C: Recent Labs    04/10/23 0235  HGBA1C 8.6*   CBG: Recent Labs  Lab 04/10/23 0228 04/10/23 0729 04/10/23 1126  GLUCAP 178* 130* 132*   Lipid Profile: No results  for input(s): "CHOL", "HDL", "LDLCALC", "TRIG", "CHOLHDL", "LDLDIRECT" in the last 72 hours. Thyroid Function Tests: No results for input(s): "TSH", "T4TOTAL", "FREET4", "T3FREE", "THYROIDAB" in the last 72 hours. Anemia Panel: No results for input(s): "VITAMINB12", "FOLATE", "FERRITIN", "TIBC", "IRON", "RETICCTPCT" in the last 72 hours. Sepsis Labs: Recent Labs  Lab 04/10/23 0007  LATICACIDVEN 1.5    No results found for this or any previous visit (from the past 240 hour(s)).   Radiology Studies: CT ABDOMEN PELVIS WO CONTRAST  Result Date: 04/09/2023 CLINICAL DATA:  Abdominal pain.  Bowel obstruction suspected. EXAM: CT ABDOMEN AND PELVIS WITHOUT CONTRAST TECHNIQUE: Multidetector CT imaging of the abdomen and pelvis was performed following the standard protocol without IV contrast. RADIATION DOSE REDUCTION: This exam was performed according to the departmental dose-optimization program which includes automated exposure control, adjustment of the mA and/or kV according to patient size and/or use of iterative reconstruction technique. COMPARISON:  CT abdomen and pelvis 10/08/2010 FINDINGS: Lower chest: No acute abnormality. Hepatobiliary: Cholecystectomy. Unremarkable liver and biliary tree. Pancreas: Unremarkable. Spleen: Unremarkable. Adrenals/Urinary Tract: Normal  adrenal glands. No urinary calculi or hydronephrosis. Unremarkable bladder. Stomach/Bowel: Normal caliber large and small bowel. Colonic diverticulosis without diverticulitis. Normal appendix. Stomach is within normal limits. Vascular/Lymphatic: Aortic atherosclerosis. No enlarged abdominal or pelvic lymph nodes. Reproductive: Status post hysterectomy. No adnexal masses. Other: No free intraperitoneal fluid or air. Musculoskeletal: No acute osseous abnormality. IMPRESSION: 1. No acute abnormality in the abdomen or pelvis. Aortic Atherosclerosis (ICD10-I70.0). Electronically Signed   By: Minerva Fester M.D.   On: 04/09/2023 23:48   DG Chest 2 View  Result Date: 04/09/2023 CLINICAL DATA:  Chest pain EXAM: CHEST - 2 VIEW COMPARISON:  Radiographs 10/01/2022 FINDINGS: Stable cardiomediastinal silhouette. Sternotomy and CABG. Coronary stenting. Hazy opacity in the left lower lung is likely due to soft tissue overlap. The lungs are clear. No definite acute displaced rib fracture. IMPRESSION: No active cardiopulmonary disease. Electronically Signed   By: Minerva Fester M.D.   On: 04/09/2023 22:01    Scheduled Meds:  insulin aspart  0-6 Units Subcutaneous Q4H   insulin glargine-yfgn  10 Units Subcutaneous Daily   metoprolol succinate  50 mg Oral Daily   pantoprazole  40 mg Oral BID   rosuvastatin  10 mg Oral Daily   warfarin  7.5 mg Oral ONCE-1600   Warfarin - Pharmacist Dosing Inpatient   Does not apply q1600   Continuous Infusions:  sodium chloride 100 mL/hr at 04/10/23 1243    LOS: 0 days   Marguerita Merles, DO [facility] Available via Epic secure chat 7am-7pm After these hours, please refer to coverage provider listed on [email or web address] 04/10/2023, 3:35 PM

## 2023-04-10 NOTE — H&P (Signed)
History and Physical    [name] WUJ:[id number] DOB: September 01, 1951 DOA: 04/09/2023  PCP: Georgianne Fick, MD   Patient coming from: Home   Chief Complaint: Epigastric pain, N/V   HPI: [name] is a pleasant 72 y.o. female with medical history significant for insulin-dependent diabetes mellitus, CAD status post CABG, chronic HFpEF, atrial fibrillation on warfarin, OSA not on CPAP, and CKD 3B who presents with epigastric pain, nausea, and vomiting.  Patient reports a long history of acid reflux, takes daily Protonix, and uses OTC antacids as needed.  She also reports a history of gastroparesis attributed to her diabetes and had previously been taking Reglan regularly but ran out.  She had an EGD in 2019 with benign-appearing stenoses that were dilated, hiatal hernia, and normal-appearing stomach and duodenum.   The current symptoms are the same as what she has experienced in the past, but have been lasting longer.  She has an appointment to see Dr. [name] of GI on 04/14/2023.  She denies fevers, diarrhea, diaphoresis, or shortness of breath.  ED Course: Upon arrival to the ED, patient is found to be afebrile and saturating well on room air with stable blood pressure.  Labs are most notable for creatinine 2.58.  There are no acute findings on chest x-ray or CT of the abdomen and pelvis.  Patient was treated in the ED with 1 L of LR, Pepcid, GI cocktail, and Reglan.  Review of Systems:  All other systems reviewed and apart from HPI, are negative.  Past Medical History:  Diagnosis Date   Arthritis    "knees" (02/05/2016)   Cancer (HCC) 5/10   [facility]    CHF (congestive heart failure) (HCC)    Coronary artery disease    GERD (gastroesophageal reflux disease)    tx meds   H/O hiatal hernia    History of blood transfusion "several"   last april 2015 s/p knee surgery (02/05/2016)   Hypercholesteremia    Hypertension    Lung cancer (HCC)    2010, surgery 18% left lung  no radiation or chemo   Non-STEMI (non-ST elevated myocardial infarction) (HCC) 04/07/2012   See cath results below   Paroxysmal atrial fibrillation (HCC) 04/07/2012   On Warfarin   Peripheral vascular disease (HCC) 8/12   Lt SFA PTA   Presence of stent in LAD coronary artery 04/07/2012   Xience Expedition DES 2.75 mm x 18 mm (dilated to 3.0 mm)   Sleep apnea    does not wear CPAP   Type II diabetes mellitus (HCC)    insulin dependent    Past Surgical History:  Procedure Laterality Date   ABDOMINAL HYSTERECTOMY  1990   BREAST EXCISIONAL BIOPSY Right    CARDIAC CATHETERIZATION  11/04/2008   patent RCA, LM, and Circ, nl EF   CARDIAC CATHETERIZATION  02/20/2002   patent coronaries with the only abnormality being a smooth luminla irregularity in the mid intermediate ramus branch no felt to be hemodynamically significant, nl LV   CARDIAC CATHETERIZATION N/A 02/05/2016   Procedure: Left Heart Cath and Coronary Angiography;  Surgeon: Marykay Lex, MD;  Location: [facility] INVASIVE CV LAB;  Service: Cardiovascular;  Laterality: N/A;   CARDIAC CATHETERIZATION N/A 02/05/2016   Procedure: Coronary Stent Intervention;  Surgeon: Marykay Lex, MD;  Location: [facility] INVASIVE CV LAB;  Service: Cardiovascular;  Laterality: N/A;   CARDIOVERSION N/A 07/16/2021   Procedure: CARDIOVERSION;  Surgeon: Christell Constant, MD;  Location: [facility] ENDOSCOPY;  Service: Cardiovascular;  Laterality: N/A;   CATARACT EXTRACTION W/ INTRAOCULAR LENS  IMPLANT, BILATERAL  2013   CORONARY ANGIOPLASTY WITH STENT PLACEMENT  04/07/2012   Xience Expedition DES 2.27mm x 18 mm (dilated to 3.0 mm) to the prox LAD    CORONARY ANGIOPLASTY WITH STENT PLACEMENT  2013; 2015   CORONARY ARTERY BYPASS GRAFT N/A 08/17/2017   Procedure: CORONARY ARTERY BYPASS GRAFTING (CABG) TIMES 1 USING LEFT INTERNAL MAMMARY ARTERY;  Surgeon: Loreli Slot, MD;  Location: [facility] OR;  Service: Open Heart Surgery;  Laterality: N/A;   DILATION AND CURETTAGE OF  UTERUS  <1990   ESOPHAGOGASTRODUODENOSCOPY N/A 05/23/2015   Procedure: ESOPHAGOGASTRODUODENOSCOPY (EGD);  Surgeon: Jeani Hawking, MD;  Location: [facility] ENDOSCOPY;  Service: Endoscopy;  Laterality: N/A;   ESOPHAGOGASTRODUODENOSCOPY (EGD) WITH PROPOFOL N/A 06/13/2015   Procedure: ESOPHAGOGASTRODUODENOSCOPY (EGD) WITH PROPOFOL;  Surgeon: Jeani Hawking, MD;  Location: [facility] ENDOSCOPY;  Service: Endoscopy;  Laterality: N/A;   ESOPHAGOGASTRODUODENOSCOPY (EGD) WITH PROPOFOL N/A 04/07/2018   Procedure: ESOPHAGOGASTRODUODENOSCOPY (EGD) WITH PROPOFOL;  Surgeon: Jeani Hawking, MD;  Location: [facility] ENDOSCOPY;  Service: Endoscopy;  Laterality: N/A;  fluoroscopy is needed   FRACTURE SURGERY     HAMMER TOE SURGERY Bilateral 1993   with screws, on screw removed   JOINT REPLACEMENT     KNEE ARTHROSCOPY Bilateral    left x2, right x1   LAPAROSCOPIC CHOLECYSTECTOMY     LEFT HEART CATH AND CORONARY ANGIOGRAPHY N/A 08/11/2017   Procedure: LEFT HEART CATH AND CORONARY ANGIOGRAPHY;  Surgeon: Swazil[place], Peter M, MD;  Location: [facility] INVASIVE CV LAB;  Service: Cardiovascular;  Laterality: N/A;   LEFT HEART CATHETERIZATION WITH CORONARY ANGIOGRAM N/A 04/07/2012   Procedure: LEFT HEART CATHETERIZATION WITH CORONARY ANGIOGRAM;  Surgeon: Runell Gess, MD;  Location: [facility] CATH LAB;  Service: Cardiovascular;  Laterality: N/A;   LEFT HEART CATHETERIZATION WITH CORONARY ANGIOGRAM N/A 05/27/2014   Procedure: LEFT HEART CATHETERIZATION WITH CORONARY ANGIOGRAM;  Surgeon: Runell Gess, MD; LAD 99% ISR, CFX 50-60%, RCA (dominant) no sig dz, EF 60%   LOWER EXTREMITY ANGIOGRAM  05/17/11   directional atherectomy to the prox L SFA using a LX Man TurboHawk, ballooned with a [place] balloon    ORIF ANKLE FRACTURE  07/09/2012   Procedure: OPEN REDUCTION INTERNAL FIXATION (ORIF) ANKLE FRACTURE;  Surgeon: Cammy Copa, MD;  Location: [facility] ORS;  Service: Orthopedics;  Laterality: Left;  open reduction internal fixation trimalleolar ankle fracture  medial malleolous fixation   PERCUTANEOUS CORONARY STENT INTERVENTION (PCI-S) N/A 04/07/2012   Procedure: PERCUTANEOUS CORONARY STENT INTERVENTION (PCI-S);  Surgeon: Runell Gess, MD;  Location: [facility] CATH LAB;  Service: Cardiovascular;  Laterality: N/A;   PERCUTANEOUS CORONARY STENT INTERVENTION (PCI-S)  05/27/2014   Procedure: PERCUTANEOUS CORONARY STENT INTERVENTION (PCI-S);  Surgeon: Runell Gess, MD; 3 mm x 12 mm long Xience Xpedition DES to the proximal LAD   SAVORY DILATION N/A 06/13/2015   Procedure: SAVORY DILATION;  Surgeon: Jeani Hawking, MD;  Location: [facility] ENDOSCOPY;  Service: Endoscopy;  Laterality: N/A;   SAVORY DILATION N/A 04/07/2018   Procedure: SAVORY DILATION;  Surgeon: Jeani Hawking, MD;  Location: [facility] ENDOSCOPY;  Service: Endoscopy;  Laterality: N/A;   TEE WITHOUT CARDIOVERSION N/A 08/17/2017   Procedure: TRANSESOPHAGEAL ECHOCARDIOGRAM (TEE);  Surgeon: Loreli Slot, MD;  Location: [facility] OR;  Service: Open Heart Surgery;  Laterality: N/A;   TEE WITHOUT CARDIOVERSION N/A 07/16/2021   Procedure: TRANSESOPHAGEAL ECHOCARDIOGRAM (TEE);  Surgeon: Christell Constant, MD;  Location: [facility] ENDOSCOPY;  Service: Cardiovascular;  Laterality: N/A;  TONSILLECTOMY     TOTAL KNEE ARTHROPLASTY Left 2003   TOTAL KNEE REVISION Left 11/05/2013   Procedure: LEFT TOTAL KNEE RESECTION;  Surgeon: Shelda Pal, MD;  Location: [facility] ORS;  Service: Orthopedics;  Laterality: Left;   TOTAL KNEE REVISION Left 2007   opened in 2006 and cleaned out, 2007 revision   TOTAL KNEE REVISION Left 12/31/2013   Procedure: RE-INPLANTATION LEFT TOTAL KNEE ;  Surgeon: Shelda Pal, MD;  Location: [facility] ORS;  Service: Orthopedics;  Laterality: Left;   VIDEO ASSISTED THORACOSCOPY (VATS)/ LOBECTOMY  01/2009    Social History:   reports that she quit smoking about 38 years ago. Her smoking use included cigarettes. She started smoking about 58 years ago. She has a 20 pack-year smoking history. She has never been exposed  to tobacco smoke. She has never used smokeless tobacco. She reports that she does not drink alcohol and does not use drugs.  Allergies  Allergen Reactions   Levofloxacin Itching   Morphine And Codeine Itching and Other (See Comments)    Pt reports oxycodone history with no allergy.    Family History  Problem Relation Age of Onset   Hypertension Mother    Coronary artery disease Brother    Hypertension Sister      Prior to Admission medications   Medication Sig Start Date End Date Taking? Authorizing Provider  Blood Glucose Monitoring Suppl (ONETOUCH VERIO FLEX SYSTEM) w/Device KIT  07/23/20   [provider]  Cholecalciferol (VITAMIN D3) 50 MCG (2000 UT) TABS Take 2,000 Units by mouth daily.    [provider]  diclofenac Sodium (VOLTAREN) 1 % GEL Apply 2 g topically 4 (four) times daily. Right knee 10/08/22   Arrien, York Ram, MD  furosemide (LASIX) 40 MG tablet Take 1 tablet (40 mg total) by mouth 3 (three) times a week. TAKE Monday Wednesday AND FRIDAY Patient taking differently: Take 40 mg by mouth every Monday, Wednesday, and Friday. 08/11/22   Croitoru, Mihai, MD  insulin NPH-regular Human (70-30) 100 UNIT/ML injection Inject 20 Units into the skin 2 (two) times daily with a meal. 10/08/22 11/07/22  Arrien, York Ram, MD  Lancets [facility] DELICA PLUS [place]) MISC Apply topically. 08/13/20   [provider]  lisinopril-hydrochlorothiazide (PRINZIDE,ZESTORETIC) 20-25 MG per tablet Take 1 tablet by mouth daily.    [provider]  metoprolol succinate (TOPROL-XL) 50 MG 24 hr tablet TAKE 1 TABLET BY MOUTH DAILY. TAKE WITH OR IMMEDIATELY FOLLOWING A MEAL. 03/23/23   Carlos Levering, NP  metoprolol tartrate (LOPRESSOR) 25 MG tablet Take 1 table as needed for heart rate above 120bpm. 03/30/23   Wittenborn, Gavin Pound, NP  ONETOUCH VERIO test strip 1 each 2 (two) times daily. 10/11/20   [provider]  oxyCODONE-acetaminophen  (PERCOCET/ROXICET) 5-325 MG tablet Take 1 tablet by mouth every 6 (six) hours as needed for severe pain. 10/08/22   Arrien, York Ram, MD  pantoprazole (PROTONIX) 40 MG tablet Take 1 tablet (40 mg total) by mouth daily. Patient taking differently: Take 40 mg by mouth every evening. 06/24/22   Croitoru, Mihai, MD  polyethylene glycol (MIRALAX / GLYCOLAX) 17 g packet Take 17 g by mouth daily. 10/08/22   Arrien, York Ram, MD  rosuvastatin (CRESTOR) 40 MG tablet Take 40 mg by mouth daily.    [provider]  Semaglutide,0.25 or 0.5MG /DOS, (OZEMPIC, 0.25 OR 0.5 MG/DOSE,) 2 MG/1.5ML SOPN Inject into the skin.    [provider]  spironolactone (ALDACTONE) 25 MG tablet Take  1 tablet (25 mg total) by mouth 3 (three) times a week. TAKE Monday Wednesday AND Friday Patient taking differently: Take 25 mg by mouth every Monday, Wednesday, and Friday. 08/11/22   Croitoru, Mihai, MD  warfarin (COUMADIN) 7.5 MG tablet TAKE 1/2 TABLET TO 1 TABLET BY MOUTH DAILY AS DIRECTED BY COUMADIN CLINIC 12/30/22   Croitoru, Rachelle Hora, MD    Physical Exam: Vitals:   04/09/23 2200 04/09/23 2300 04/10/23 0000 04/10/23 0119  BP: (!) 141/52 (!) 123/49  (!) 145/64  Pulse: (!) 104 81 97 87  Resp: 14 15 17 17   Temp:    97.6 F (36.4 C)  TempSrc:    Oral  SpO2: 100% 100% 100% 100%  Weight:      Height:        Constitutional: NAD, calm  Eyes: PERTLA, lids and conjunctivae normal ENMT: Mucous membranes are moist. Posterior pharynx clear of any exudate or lesions.   Neck: supple, no masses  Respiratory: no wheezing, no crackles. No accessory muscle use.  Cardiovascular: S1 & S2 heard, regular rate and rhythm. No extremity edema.   Abdomen: no tenderness, soft. Bowel sounds active.  Musculoskeletal: no clubbing / cyanosis. No joint deformity upper and lower extremities.   Skin: no significant rashes, lesions, ulcers. Warm, dry, well-perfused. Neurologic: CN 2-12 grossly intact. Moving all extremities.  Alert and oriented.  Psychiatric: Pleasant. Cooperative.    Labs and Imaging on Admission: I have personally reviewed following labs and imaging studies  CBC: Recent Labs  Lab 04/09/23 2120  WBC 9.6  HGB 11.7*  HCT 35.5*  MCV 89.4  PLT 244   Basic Metabolic Panel: Recent Labs  Lab 04/09/23 2120  NA 135  K 4.1  CL 91*  CO2 25  GLUCOSE 262*  BUN 33*  CREATININE 2.58*  CALCIUM 10.4*   GFR: Estimated Creatinine Clearance: 23.1 mL/min (A) (by C-G formula based on SCr of 2.58 mg/dL (H)). Liver Function Tests: No results for input(s): "AST", "ALT", "ALKPHOS", "BILITOT", "PROT", "ALBUMIN" in the last 168 hours. No results for input(s): "LIPASE", "AMYLASE" in the last 168 hours. No results for input(s): "AMMONIA" in the last 168 hours. Coagulation Profile: No results for input(s): "INR", "PROTIME" in the last 168 hours. Cardiac Enzymes: No results for input(s): "CKTOTAL", "CKMB", "CKMBINDEX", "TROPONINI" in the last 168 hours. BNP (last 3 results) No results for input(s): "PROBNP" in the last 8760 hours. HbA1C: No results for input(s): "HGBA1C" in the last 72 hours. CBG: No results for input(s): "GLUCAP" in the last 168 hours. Lipid Profile: No results for input(s): "CHOL", "HDL", "LDLCALC", "TRIG", "CHOLHDL", "LDLDIRECT" in the last 72 hours. Thyroid Function Tests: No results for input(s): "TSH", "T4TOTAL", "FREET4", "T3FREE", "THYROIDAB" in the last 72 hours. Anemia Panel: No results for input(s): "VITAMINB12", "FOLATE", "FERRITIN", "TIBC", "IRON", "RETICCTPCT" in the last 72 hours. Urine analysis:    Component Value Date/Time   COLORURINE YELLOW 10/01/2022 1951   APPEARANCEUR CLEAR 10/01/2022 1951   LABSPEC 1.008 10/01/2022 1951   PHURINE 6.0 10/01/2022 1951   GLUCOSEU 150 (A) 10/01/2022 1951   HGBUR SMALL (A) 10/01/2022 1951   BILIRUBINUR NEGATIVE 10/01/2022 1951   KETONESUR 5 (A) 10/01/2022 1951   PROTEINUR NEGATIVE 10/01/2022 1951   UROBILINOGEN 0.2  12/21/2013 1129   NITRITE NEGATIVE 10/01/2022 1951   LEUKOCYTESUR NEGATIVE 10/01/2022 1951   Sepsis Labs: @LABRCNTIP (procalcitonin:4,lacticidven:4) )No results found for this or any previous visit (from the past 240 hour(s)).   Radiological Exams on Admission: CT ABDOMEN PELVIS WO CONTRAST  Result Date: 04/09/2023 CLINICAL DATA:  Abdominal pain.  Bowel obstruction suspected. EXAM: CT ABDOMEN AND PELVIS WITHOUT CONTRAST TECHNIQUE: Multidetector CT imaging of the abdomen and pelvis was performed following the standard protocol without IV contrast. RADIATION DOSE REDUCTION: This exam was performed according to the departmental dose-optimization program which includes automated exposure control, adjustment of the mA and/or kV according to patient size and/or use of iterative reconstruction technique. COMPARISON:  CT abdomen and pelvis 10/08/2010 FINDINGS: Lower chest: No acute abnormality. Hepatobiliary: Cholecystectomy. Unremarkable liver and biliary tree. Pancreas: Unremarkable. Spleen: Unremarkable. Adrenals/Urinary Tract: Normal adrenal glands. No urinary calculi or hydronephrosis. Unremarkable bladder. Stomach/Bowel: Normal caliber large and small bowel. Colonic diverticulosis without diverticulitis. Normal appendix. Stomach is within normal limits. Vascular/Lymphatic: Aortic atherosclerosis. No enlarged abdominal or pelvic lymph nodes. Reproductive: Status post hysterectomy. No adnexal masses. Other: No free intraperitoneal fluid or air. Musculoskeletal: No acute osseous abnormality. IMPRESSION: 1. No acute abnormality in the abdomen or pelvis. Aortic Atherosclerosis (ICD10-I70.0). Electronically Signed   By: Minerva Fester M.D.   On: 04/09/2023 23:48   DG Chest 2 View  Result Date: 04/09/2023 CLINICAL DATA:  Chest pain EXAM: CHEST - 2 VIEW COMPARISON:  Radiographs 10/01/2022 FINDINGS: Stable cardiomediastinal silhouette. Sternotomy and CABG. Coronary stenting. Hazy opacity in the left lower lung  is likely due to soft tissue overlap. The lungs are clear. No definite acute displaced rib fracture. IMPRESSION: No active cardiopulmonary disease. Electronically Signed   By: Minerva Fester M.D.   On: 04/09/2023 22:01    EKG: Independently reviewed. Sinus rhythm, 1st degree AV block, RBBB, LVH with repolarization abnormality.   Assessment/Plan   1. AKI superimposed on CKD 3B  - SCr is 2.58 on admission, up from baseline of ~1.3  - Hold diuretics and ACE-i, renally-dose medications, gentle IVF hydration, repeat chem panel   2. Abdominal pain, N/V - No acute findings on CT; exam benign  - She reports hx of GERD and gastroparesis and is scheduled to see GI on 04/14/23  - Check LFTs and lipase, allow clears and advance as tolerated, continue symptomatic treatments    3. CAD  - No anginal complaints   - Continue beta-blocker and statin    4. PAF  - Continue warfarin, metoprolol   5. Chronic HFpEF  - Holding diuretics as above   6. Hypertension  - Hold lisinopril-hydrochlorothiazide as above   7. Insulin-dependent DM  - A1c was 8.8% in January 2024  - Check CBGs, continue insulin with dose-reduction in light of poor oral intake and decreased GFR    DVT prophylaxis: warfarin  Code Status: Full  Level of Care: Level of care: Med-Surg Family Communication: Son at bedside   Disposition Plan:  Patient is from: Home   Anticipated d/c is to: TBD Anticipated d/c date is: 04/13/23  Patient currently: Pending improved/stable renal function, tolerance of adequate oral intake  Consults called: None  Admission status: Inpatient    Briscoe Deutscher, MD [facility]  04/10/2023, 1:30 AM

## 2023-04-10 NOTE — Evaluation (Signed)
Physical Therapy Evaluation Patient Details Name: [name] MRN: [id number] DOB: 1951/02/13 Today's Date: 04/10/2023  History of Present Illness  Pt is a 71 y/o female who presents 7/13 to ED with c/o familiar sternal chest pain ongoing for ~ 5 days, N/V, and epigastric pain. PMH significant for insulin-dependent diabetes mellitus, CAD status post CABG, chronic HFpEF, atrial fibrillation on warfarin, OSA not on CPAP, and CKD 3B. CT abdomen/pelvis imaging 7/13 unremarkable.   Clinical Impression  PTA pt reports living at home with her son who is available PRN for assistance. Pt had a recent stay at SNF but has since returned home where she reports increased need for assistance in mobility and ADL's from her son. Reports receiving HH PT x1 /week from [facility]. Pt states she has been independently transferring to her w/c and self-propelling in the home, although more difficulty and assistance needed as of recent. Today, pt with min A in bed mobility and mod A x2 person stand pivot using the RW. Observed knee buckling and difficulty with foot clearance but demos a L lateral step to the recliner. Patient will benefit from intensive inpatient follow up therapy, <3 hours/day at this time, to further facilitate improvements in functional mobility to promote independence. Potential to progress to HHPT with improvements.       Assistance Recommended at Discharge Frequent or constant Supervision/Assistance  If plan is discharge home, recommend the following:  Can travel by private vehicle  A lot of help with walking and/or transfers;A lot of help with bathing/dressing/bathroom;Assistance with cooking/housework;Help with stairs or ramp for entrance;Assist for transportation   No    Equipment Recommendations None recommended by PT  Recommendations for Other Services  OT consult    Functional Status Assessment Patient has had a recent decline in their functional status and demonstrates the  ability to make significant improvements in function in a reasonable and predictable amount of time.     Precautions / Restrictions Precautions Precautions: Fall Restrictions Weight Bearing Restrictions: No      Mobility  Bed Mobility Overal bed mobility: Needs Assistance Bed Mobility: Supine to Sit     Supine to sit: Min assist, HOB elevated     General bed mobility comments: Initiates BLE navigation towards EOB, requests therapist assist in RUE grasp to pull to seated - redirected grasp to bed rail and min A provided to push to upright seated.    Transfers Overall transfer level: Needs assistance Equipment used: Rolling walker (2 wheels), 2 person hand held assist Transfers: Sit to/from Stand, Bed to chair/wheelchair/BSC Sit to Stand: +2 physical assistance, Mod assist Stand pivot transfers: +2 physical assistance, Mod assist         General transfer comment: Heavy x2 person assist to stand with increased time for hand placement on RW, increased trunk flexion, able to step laterally to L and pivot turn but impulsive with stability. Recliner positioned closer to support premature sit.    Ambulation/Gait                  Stairs            Wheelchair Mobility     Tilt Bed    Modified Rankin (Stroke Patients Only)       Balance Overall balance assessment: Needs assistance Sitting-balance support: Feet supported, No upper extremity supported Sitting balance-Leahy Scale: Fair     Standing balance support: Bilateral upper extremity supported, During functional activity, Reliant on assistive device for balance Standing balance-Leahy Scale: Zero Standing  balance comment: Difficulty with reaching complete extension in stand, increased trunk flexion, reliant on external device. Intermittent buckling of knees.                             Pertinent Vitals/Pain Pain Assessment Pain Assessment: Faces Faces Pain Scale: Hurts little more Pain  Location: bilat knees, L>R Pain Descriptors / Indicators: Discomfort, Grimacing Pain Intervention(s): Limited activity within patient's tolerance, Monitored during session    Home Living Family/patient expects to be discharged to:: Private residence Living Arrangements: Children Available Help at Discharge: Family;Available PRN/intermittently Type of Home: House Home Access: Stairs to enter Entrance Stairs-Rails: None Entrance Stairs-Number of Steps: 1   Home Layout: One level Home Equipment: Agricultural consultant (2 wheels);Rollator (4 wheels);BSC/3in1;Shower seat;Wheelchair - Actor Comments: Home setup gathered 10/04/22 OT eval, confirmed today with pt no change.    Prior Function Prior Level of Function : Needs assist       Physical Assist : ADLs (physical);Mobility (physical) Mobility (physical): Transfers;Gait ADLs (physical): Bathing;IADLs Mobility Comments:  (Reports hospital bed to w/c transfer, w/c to [facility] transfer, and w/c to couch transfer. States she has been able to complete this by herself with manual self-propulsion in the w/c, only recently has required more assist with mobility ADLs.) ADLs Comments: Son is the primary driver/transporter as of recent, reports recieving assistance from son for sponge baths and dressing.     Hand Dominance        Extremity/Trunk Assessment   Upper Extremity Assessment Upper Extremity Assessment: Defer to OT evaluation    Lower Extremity Assessment Lower Extremity Assessment: Generalized weakness    Cervical / Trunk Assessment Cervical / Trunk Assessment: Kyphotic  Communication   Communication: HOH  Cognition Arousal/Alertness: Awake/alert Behavior During Therapy: WFL for tasks assessed/performed Overall Cognitive Status: No family/caregiver present to determine baseline cognitive functioning                                 General Comments: Inconsistent with reports of ADL/mobility  assistance. Some decreased level of deficit awareness.        General Comments General comments (skin integrity, edema, etc.): Donned depends briefs.    Exercises     Assessment/Plan    PT Assessment Patient needs continued PT services  PT Problem List Decreased activity tolerance;Decreased balance;Decreased mobility;Pain       PT Treatment Interventions Functional mobility training;Therapeutic activities;Therapeutic exercise    PT Goals (Current goals can be found in the Care Plan section)  Acute Rehab PT Goals Patient Stated Goal: go home PT Goal Formulation: With patient Time For Goal Achievement: 04/24/23 Potential to Achieve Goals: Good    Frequency Min 3X/week     Co-evaluation               AM-PAC PT "6 Clicks" Mobility  Outcome Measure Help needed turning from your back to your side while in a flat bed without using bedrails?: A Little Help needed moving from lying on your back to sitting on the side of a flat bed without using bedrails?: A Lot Help needed moving to and from a bed to a chair (including a wheelchair)?: A Lot Help needed standing up from a chair using your arms (e.g., wheelchair or bedside chair)?: A Lot Help needed to walk in hospital room?: A Lot Help needed climbing 3-5 steps with a railing? : Total 6 Click  Score: 12    End of Session Equipment Utilized During Treatment: Gait belt Activity Tolerance: Patient tolerated treatment well Patient left: in chair;with call bell/phone within reach;with chair alarm set;with nursing/sitter in room Nurse Communication: Mobility status PT Visit Diagnosis: Unsteadiness on feet (R26.81);History of falling (Z91.81);Difficulty in walking, not elsewhere classified (R26.2)    Time: 1610-9604 PT Time Calculation (min) (ACUTE ONLY): 22 min   Charges:   PT Evaluation $PT Eval Moderate Complexity: 1 Mod   PT General Charges $$ ACUTE PT VISIT: 1 Visit         Hendricks Milo, SPT  Acute  Rehabilitation Services   Hendricks Milo 04/10/2023, 10:54 AM

## 2023-04-10 NOTE — Progress Notes (Signed)
New Admission Note:   Arrival Method: Via stretcher from ED Mental Orientation:  A & O x 4 Telemetry: N/A Assessment: Completed Skin:  Skin is dry but intact IV:  Right AC PIV Pain: Denies Tubes:  Purwick placed on patient per MD order Safety Measures: Safety Fall Prevention Plan has been given, discussed and signed Admission: Completed 5 MW Orientation: Patient has been orientated to the room, unit and staff.  Family:  Son is at bedside  Patient brought her cell phone with charger, upper and lower dentures, clothing and a pair of earrings.  She was advised of the [employer].  Orders have been reviewed and implemented. Will continue to monitor the patient. Call light has been placed within reach and bed alarm has been activated.   Bernie Covey RN Phone number: [phone]

## 2023-04-10 NOTE — Progress Notes (Signed)
ANTICOAGULATION CONSULT NOTE - Follow Up Consult  Pharmacy Consult for Coumadin Indication: atrial fibrillation    Allergies  Allergen Reactions   Levofloxacin Itching   Morphine And Codeine Itching and Other (See Comments)    Pt reports oxycodone history with no allergy.    Patient Measurements: Height: 5\' 3"  (160 cm) Weight: 110.2 kg (243 lb) IBW/kg (Calculated) : 52.4  Vital Signs: Temp: 97.5 F (36.4 C) (07/14 0220) Temp Source: Oral (07/14 0220) BP: 138/49 (07/14 0220) Pulse Rate: 79 (07/14 0220)  Labs: Recent Labs    04/09/23 2120 04/10/23 0007 04/10/23 0235  HGB 11.7*  --  11.0*  HCT 35.5*  --  33.8*  PLT 244  --  214  LABPROT  --   --  27.6*  INR  --   --  2.5*  CREATININE 2.58*  --  2.44*  TROPONINIHS 10 8  --     Estimated Creatinine Clearance: 24.5 mL/min (A) (by C-G formula based on SCr of 2.44 mg/dL (H)).   Medications:  Scheduled:   insulin aspart  0-6 Units Subcutaneous Q4H   insulin glargine-yfgn  10 Units Subcutaneous Daily   metoprolol succinate  50 mg Oral Daily   pantoprazole  40 mg Oral BID   rosuvastatin  10 mg Oral Daily   Warfarin - Pharmacist Dosing Inpatient   Does not apply q1600   PRN: acetaminophen **OR** acetaminophen, fentaNYL (SUBLIMAZE) injection, mouth rinse, prochlorperazine  Assessment: 72 y.o. female admitted with abdominal pain, h/o atrial fibrillation, to continue warfarin. Patient reported home regimen as one tablet (7.5 mg) on Sun/Tues/Thurs and 1/2 tablet (3.75 mg) all other days. She reported her only recent missed dose was yesterday (7/13). Patient's INR today (7/14) was 2.5, therefore, it is appropriate to resume patient's home regimen. CBC stable.   Goal of Therapy:  INR 2-3 Monitor platelets by anticoagulation protocol: Yes   Plan:  Warfarin 7.5 mg x 1 today Daily PT/INR. CBC intermittently. Monitor for signs/symptoms of bleeding.  [name], PharmD Pharmacy Resident

## 2023-04-10 NOTE — Progress Notes (Signed)
ANTICOAGULATION CONSULT NOTE - Initial Consult  Pharmacy Consult for Coumadin Indication: atrial fibrillation  Allergies  Allergen Reactions   Levofloxacin Itching   Morphine And Codeine Itching and Other (See Comments)    Pt reports oxycodone history with no allergy.    Patient Measurements: Height: 5\' 3"  (160 cm) Weight: 110.2 kg (243 lb) IBW/kg (Calculated) : 52.4  Vital Signs: Temp: 97.5 F (36.4 C) (07/14 0220) Temp Source: Oral (07/14 0220) BP: 138/49 (07/14 0220) Pulse Rate: 79 (07/14 0220)  Labs: Recent Labs    04/09/23 2120 04/10/23 0007  HGB 11.7*  --   HCT 35.5*  --   PLT 244  --   CREATININE 2.58*  --   TROPONINIHS 10 8    Estimated Creatinine Clearance: 23.1 mL/min (A) (by C-G formula based on SCr of 2.58 mg/dL (H)).   Medical History: Past Medical History:  Diagnosis Date   Arthritis    "knees" (02/05/2016)   Cancer (HCC) 5/10   [facility]    CHF (congestive heart failure) (HCC)    Coronary artery disease    GERD (gastroesophageal reflux disease)    tx meds   H/O hiatal hernia    History of blood transfusion "several"   last april 2015 s/p knee surgery (02/05/2016)   Hypercholesteremia    Hypertension    Lung cancer (HCC)    2010, surgery 18% left lung no radiation or chemo   Non-STEMI (non-ST elevated myocardial infarction) (HCC) 04/07/2012   See cath results below   Paroxysmal atrial fibrillation (HCC) 04/07/2012   On Warfarin   Peripheral vascular disease (HCC) 8/12   Lt SFA PTA   Presence of stent in LAD coronary artery 04/07/2012   Xience Expedition DES 2.75 mm x 18 mm (dilated to 3.0 mm)   Sleep apnea    does not wear CPAP   Type II diabetes mellitus (HCC)    insulin dependent    Medications:  Medications Prior to Admission  Medication Sig Dispense Refill Last Dose   Blood Glucose Monitoring Suppl (ONETOUCH VERIO FLEX SYSTEM) w/Device KIT       Cholecalciferol (VITAMIN D3) 50 MCG (2000 UT) TABS Take 2,000 Units by mouth daily.       diclofenac Sodium (VOLTAREN) 1 % GEL Apply 2 g topically 4 (four) times daily. Right knee 50 g 0    furosemide (LASIX) 40 MG tablet Take 1 tablet (40 mg total) by mouth 3 (three) times a week. TAKE Monday Wednesday AND FRIDAY (Patient taking differently: Take 40 mg by mouth every Monday, Wednesday, and Friday.) 36 tablet 3    insulin NPH-regular Human (70-30) 100 UNIT/ML injection Inject 20 Units into the skin 2 (two) times daily with a meal. 12 mL 0    Lancets (ONETOUCH DELICA PLUS LANCET33G) MISC Apply topically.      lisinopril-hydrochlorothiazide (PRINZIDE,ZESTORETIC) 20-25 MG per tablet Take 1 tablet by mouth daily.      metoprolol succinate (TOPROL-XL) 50 MG 24 hr tablet TAKE 1 TABLET BY MOUTH DAILY. TAKE WITH OR IMMEDIATELY FOLLOWING A MEAL. 90 tablet 1    metoprolol tartrate (LOPRESSOR) 25 MG tablet Take 1 table as needed for heart rate above 120bpm. 90 tablet 3    ONETOUCH VERIO test strip 1 each 2 (two) times daily.      oxyCODONE-acetaminophen (PERCOCET/ROXICET) 5-325 MG tablet Take 1 tablet by mouth every 6 (six) hours as needed for severe pain. 10 tablet 0    pantoprazole (PROTONIX) 40 MG tablet Take 1 tablet (  40 mg total) by mouth daily. (Patient taking differently: Take 40 mg by mouth every evening.) 90 tablet 3    polyethylene glycol (MIRALAX / GLYCOLAX) 17 g packet Take 17 g by mouth daily. 14 each 0    rosuvastatin (CRESTOR) 40 MG tablet Take 40 mg by mouth daily.      Semaglutide,0.25 or 0.5MG /DOS, (OZEMPIC, 0.25 OR 0.5 MG/DOSE,) 2 MG/1.5ML SOPN Inject into the skin.      spironolactone (ALDACTONE) 25 MG tablet Take 1 tablet (25 mg total) by mouth 3 (three) times a week. TAKE Monday Wednesday AND Friday (Patient taking differently: Take 25 mg by mouth every Monday, Wednesday, and Friday.) 36 tablet 3    warfarin (COUMADIN) 7.5 MG tablet TAKE 1/2 TABLET TO 1 TABLET BY MOUTH DAILY AS DIRECTED BY COUMADIN CLINIC 65 tablet 1     Assessment: 72 y.o. female admitted with abdominal  pain, h/o Afib, to continue Coumadin  Goal of Therapy:  INR 2-3 Monitor platelets by anticoagulation protocol: Yes   Plan:  F/U current home Coumadin regimen F/U INR  Wynton Hufstetler, Gary Fleet 04/10/2023,2:28 AM

## 2023-04-10 NOTE — Hospital Course (Addendum)
The patient is a pleasant 72 year old African-American female with past medical history significant for but not limited to insulin-dependent diabetes mellitus type 2, CAD status post CABG chronic heart failure with preserved ejection fraction and diastolic dysfunction as well as history of atrial fibrillation on anticoagulation with warfarin, OSA not on CPAP as well as history of CKD stage IIIb and other comorbidities who presented with epigastric pain, nausea and vomiting.  She reports a long history of acid reflux and takes Protonix daily and uses over-the-counter antiacid as needed.  She reports that she has a history of gastroparesis attributed to her diabetes and has been previously taking Reglan regularly but ran out.  Had an EGD done in 2019 with a benign-appearing stenosis that was dilated as well as a hiatal hernia and a normal-appearing stomach and duodenum.  She continued to have epigastric pain, nausea vomiting which she experienced in the past with as having it last longer and has an appointment to see her GI doctor Dr. [name] on 04/13/2013.  She came to the ED and she was afebrile and was found to have an AKI and no other acute findings on chest x-ray or CT scan of the abdomen pelvis.  She was given a 1 L bolus of LR, Pepcid, GI cocktail and Reglan and admitted for further evaluation.  She was placed on a clear liquid diet and will advance diet slowly and continue to hydrate the patient with normal saline at 100 MLS per hour for 15 hours total and this expired but we will renew at 75 MLS per hour.  She had issues with trouble swallowing some of her pills so SLP evaluated and felt that she had esophageal dysphagia and recommended GI consultation.  GI was consulted and I spoke with Dr. Ledell Noss spoke with the patient and she will follow the patient in outpatient setting.  Given that her renal function continues to prove and she tolerated a soft diet without issues she was felt that she is from a medical  standpoint stable for discharge.  PT OT recommending home health.  At this time she is stable to discharge and will follow-up with her PCP and gastroenterologist in the outpatient setting.  Assessment and Plan:  AKI on CKD Stage 3b -BUN/Cr Trend: Recent Labs  Lab 04/09/23 2120 04/10/23 0235 04/11/23 0141 04/12/23 0101  BUN 33* 31* 23 17  CREATININE 2.58* 2.44* 1.69* 1.51*  -Continued NS at 100 mL/hr for 15 hours and expired however will renew at 75 MLS per hour with normal saline hydrate through the night -Avoid Nephrotoxic Medications, Contrast Dyes, Hypotension and Dehydration to Ensure Adequate Renal Perfusion and will need to Renally Adjust Meds and did so for discharge and discontinued her diuretic -Continue to Monitor and Trend Renal Function carefully and repeat CMP in 1 week  Hyponatremia -Na+ Trend:  Recent Labs  Lab 04/09/23 2120 04/10/23 0235 04/11/23 0141 04/12/23 0101  NA 135 132* 133* 134*  -NS at 100 mL/hr has finished but will renew at 75 MLS per hour and hydrate her overnight -Continue to Monitor and Trend and repeat CMP within 1 week  Abdominal pain, N/V and Diarrhea -No acute findings on CT and showed "No acute abnormality in the abdomen or pelvis. Aortic Atherosclerosis"; exam benign  -She reports hx of GERD and gastroparesis and is scheduled to see GI on 04/14/23  -Check LFTs and lipase; lipase was slightly elevated at 63 and LFTs were normalized -Allow clears and advance as tolerated and will go to  soft today -Continue symptomatic treatments with IVF and Antiemetics -Checking C Difficile per Protocol and was negative so we will discontinue enteric precautions -Patient is following up with her GI physician on Thursday  Dysphagia, improved -Patient is having trouble swallowing her pills even with applesauce -Check SLP and the feel her swallowing issues are related to her GERD/esophageal dysphagia and are not concerned with aspiration or any pharyngeal phase  impairments.  -Speech therapy is recommended a GI consultation or an esophagram and trying her on a soft diet. -Work with GI who went to evaluate the patient but the patient is no longer nauseous and spoke to the patient at length and recommending that the patient be followed up in the outpatient clinic at her regular scheduled appointment on 04/14/2023.   CAD status post CABG -No anginal complaints   -Continue beta-blocker and statin; resume home cardiac meds but hold hydrochlorothiazide for discharge   PAF  -Continue Warfarin with Pharmacy to Dose -C/w Metoprolol Succinate 50 mg po Daily   Chronic HFpEF  -Holding diuretics as above and discontinued hydrochlorothiazide -Getting Gentle IVF Hydration with 75 MLS per hour now -Strict I's and O's and Daily Weights;  Intake/Output Summary (Last 24 hours) at 04/12/2023 2051 Last data filed at 04/12/2023 1300 Gross per 24 hour  Intake 480 ml  Output 1950 ml  Net -1470 ml  -IV fluid hydration has now stopped and patient is renal function is improved   Hypertension  -Hold lisinopril-hydrochlorothiazide as above  -Continue to Monitor and BP per Protocol  -Last BP reading was 123/45  Uncontrolled Insulin Dependent Diabetes Mellitus Type 2 -HbA1c was 8.6 this visit and was 8.8 in January 2024 -CBG and Glucose Trend: Recent Labs  Lab 04/09/23 2120 04/10/23 0228 04/10/23 0235 04/10/23 0729 04/11/23 0141 04/11/23 0512 04/12/23 0101 04/12/23 0714 04/12/23 1128  GLUCOSE 262*  --  218*  --  103*  --  145*  --   --   GLUCAP  --    < >  --    < >  --    < >  --    < > 173*   < > = values in this interval not displayed.  -C/w Semglee 10 units sq Daily and Very Sensitive Novolog Insulin 0-6 units  Normocytic Anemia -Hgb/Hct Trend: Recent Labs  Lab 04/09/23 2120 04/10/23 0235 04/11/23 0141 04/12/23 0101  HGB 11.7* 11.0* 10.5* 10.7*  HCT 35.5* 33.8* 31.7* 32.1*  MCV 89.4 88.0 89.5 89.7  -Checked Anemia Panel showed an iron level of  52, UIBC of 232, TIBC of 284, saturation ratios of 18%, ferritin level of 133, follow-up with 12.8, vitamin B12 level 465 -Continue to Monitor for S/Sx of Bleeding; No overt bleeding noted -Repeat CBC within 1 week  GERD/GI Prophylaxis and Gastroparesis -C/w Pantoprazole 40 mg po BID -Given Metaclopramide 20 mg IV x1 -May discuss with her Primary GI Physician and patient is to follow-up with them in outpatient setting  Hypoalbuminemia -Patient's Albumin Trend: Recent Labs  Lab 04/10/23 0235 04/11/23 0141 04/12/23 0101  ALBUMIN 3.3* 2.9* 2.9*  -Continue to Monitor and Trend and repeat CMP in the AM  Morbid Obesity -Complicates overall prognosis and care -Estimated body mass index is 43.27 kg/m as calculated from the following:   Height as of this encounter: 5\' 3"  (1.6 m).   Weight as of this encounter: 110.8 kg.  -Weight Loss and Dietary Counseling given

## 2023-04-10 NOTE — ED Notes (Signed)
ED TO INPATIENT HANDOFF REPORT  ED Nurse Name and Phone #:  Johnna Acosta [phone]  S Name/Age/Gender [name] 72 y.o. female Room/Bed: 022C/022C  Code Status   Code Status: Full Code  Home/SNF/Other Home Patient oriented to: self, place, time, and situation Is this baseline? Yes   Triage Complete: Triage complete  Chief Complaint Acute renal failure superimposed on stage 3b chronic kidney disease (HCC) [N17.9, N18.32]  Triage Note Arrives EMS from home with c/o sternal chest pains ongoing for ~5 days.   Pain is familiar and usually intermittent. Has attempted baking soda with water and Mylanta with no improvement.   Hc of CABG in 2018, CHF, and CKD.    Allergies Allergies  Allergen Reactions   Levofloxacin Itching   Morphine And Codeine Itching and Other (See Comments)    Pt reports oxycodone history with no allergy.    Level of Care/Admitting Diagnosis ED Disposition     ED Disposition  Admit   Condition  --   Comment  Hospital Area: [facility] [[id number]]  Level of Care: Med-Surg [16]  May admit patient to [facility] or [facility] if equivalent level of care is available:: Yes  Covid Evaluation: Asymptomatic - no recent exposure (last 10 days) testing not required  Diagnosis: Acute renal failure superimposed on stage 3b chronic kidney disease [facility]) [[id number]]  Admitting Physician: Briscoe Deutscher [[id number]]  Attending Physician: Briscoe Deutscher [[id number]]  Certification:: I certify this patient will need inpatient services for at least 2 midnights  Estimated Length of Stay: 3          B Medical/Surgery History Past Medical History:  Diagnosis Date   Arthritis    "knees" (02/05/2016)   Cancer (HCC) 5/10   LUL Vats    CHF (congestive heart failure) (HCC)    Coronary artery disease    GERD (gastroesophageal reflux disease)    tx meds   H/O hiatal hernia    History of blood transfusion "several"   last april 2015 s/p knee  surgery (02/05/2016)   Hypercholesteremia    Hypertension    Lung cancer (HCC)    2010, surgery 18% left lung no radiation or chemo   Non-STEMI (non-ST elevated myocardial infarction) (HCC) 04/07/2012   See cath results below   Paroxysmal atrial fibrillation (HCC) 04/07/2012   On Warfarin   Peripheral vascular disease (HCC) 8/12   Lt SFA PTA   Presence of stent in LAD coronary artery 04/07/2012   Xience Expedition DES 2.75 mm x 18 mm (dilated to 3.0 mm)   Sleep apnea    does not wear CPAP   Type II diabetes mellitus (HCC)    insulin dependent   Past Surgical History:  Procedure Laterality Date   ABDOMINAL HYSTERECTOMY  1990   BREAST EXCISIONAL BIOPSY Right    CARDIAC CATHETERIZATION  11/04/2008   patent RCA, LM, and Circ, nl EF   CARDIAC CATHETERIZATION  02/20/2002   patent coronaries with the only abnormality being a smooth luminla irregularity in the mid intermediate ramus branch no felt to be hemodynamically significant, nl LV   CARDIAC CATHETERIZATION N/A 02/05/2016   Procedure: Left Heart Cath and Coronary Angiography;  Surgeon: Marykay Lex, MD;  Location: [facility] INVASIVE CV LAB;  Service: Cardiovascular;  Laterality: N/A;   CARDIAC CATHETERIZATION N/A 02/05/2016   Procedure: Coronary Stent Intervention;  Surgeon: Marykay Lex, MD;  Location: [facility] INVASIVE CV LAB;  Service: Cardiovascular;  Laterality: N/A;   CARDIOVERSION N/A  07/16/2021   Procedure: CARDIOVERSION;  Surgeon: Christell Constant, MD;  Location: [facility] ENDOSCOPY;  Service: Cardiovascular;  Laterality: N/A;   CATARACT EXTRACTION W/ INTRAOCULAR LENS  IMPLANT, BILATERAL  2013   CORONARY ANGIOPLASTY WITH STENT PLACEMENT  04/07/2012   Xience Expedition DES 2.25mm x 18 mm (dilated to 3.0 mm) to the prox LAD    CORONARY ANGIOPLASTY WITH STENT PLACEMENT  2013; 2015   CORONARY ARTERY BYPASS GRAFT N/A 08/17/2017   Procedure: CORONARY ARTERY BYPASS GRAFTING (CABG) TIMES 1 USING LEFT INTERNAL MAMMARY ARTERY;  Surgeon: Loreli Slot, MD;  Location: [facility] OR;  Service: Open Heart Surgery;  Laterality: N/A;   DILATION AND CURETTAGE OF UTERUS  <1990   ESOPHAGOGASTRODUODENOSCOPY N/A 05/23/2015   Procedure: ESOPHAGOGASTRODUODENOSCOPY (EGD);  Surgeon: Jeani Hawking, MD;  Location: [facility] ENDOSCOPY;  Service: Endoscopy;  Laterality: N/A;   ESOPHAGOGASTRODUODENOSCOPY (EGD) WITH PROPOFOL N/A 06/13/2015   Procedure: ESOPHAGOGASTRODUODENOSCOPY (EGD) WITH PROPOFOL;  Surgeon: Jeani Hawking, MD;  Location: [facility] ENDOSCOPY;  Service: Endoscopy;  Laterality: N/A;   ESOPHAGOGASTRODUODENOSCOPY (EGD) WITH PROPOFOL N/A 04/07/2018   Procedure: ESOPHAGOGASTRODUODENOSCOPY (EGD) WITH PROPOFOL;  Surgeon: Jeani Hawking, MD;  Location: [facility] ENDOSCOPY;  Service: Endoscopy;  Laterality: N/A;  fluoroscopy is needed   FRACTURE SURGERY     HAMMER TOE SURGERY Bilateral 1993   with screws, on screw removed   JOINT REPLACEMENT     KNEE ARTHROSCOPY Bilateral    left x2, right x1   LAPAROSCOPIC CHOLECYSTECTOMY     LEFT HEART CATH AND CORONARY ANGIOGRAPHY N/A 08/11/2017   Procedure: LEFT HEART CATH AND CORONARY ANGIOGRAPHY;  Surgeon: Swaziland, Peter M, MD;  Location: [facility] INVASIVE CV LAB;  Service: Cardiovascular;  Laterality: N/A;   LEFT HEART CATHETERIZATION WITH CORONARY ANGIOGRAM N/A 04/07/2012   Procedure: LEFT HEART CATHETERIZATION WITH CORONARY ANGIOGRAM;  Surgeon: Runell Gess, MD;  Location: [facility] CATH LAB;  Service: Cardiovascular;  Laterality: N/A;   LEFT HEART CATHETERIZATION WITH CORONARY ANGIOGRAM N/A 05/27/2014   Procedure: LEFT HEART CATHETERIZATION WITH CORONARY ANGIOGRAM;  Surgeon: Runell Gess, MD; LAD 99% ISR, CFX 50-60%, RCA (dominant) no sig dz, EF 60%   LOWER EXTREMITY ANGIOGRAM  05/17/11   directional atherectomy to the prox L SFA using a LX Man TurboHawk, ballooned with a [place] balloon    ORIF ANKLE FRACTURE  07/09/2012   Procedure: OPEN REDUCTION INTERNAL FIXATION (ORIF) ANKLE FRACTURE;  Surgeon: Cammy Copa, MD;  Location: [facility] ORS;   Service: Orthopedics;  Laterality: Left;  open reduction internal fixation trimalleolar ankle fracture medial malleolous fixation   PERCUTANEOUS CORONARY STENT INTERVENTION (PCI-S) N/A 04/07/2012   Procedure: PERCUTANEOUS CORONARY STENT INTERVENTION (PCI-S);  Surgeon: Runell Gess, MD;  Location: [facility] CATH LAB;  Service: Cardiovascular;  Laterality: N/A;   PERCUTANEOUS CORONARY STENT INTERVENTION (PCI-S)  05/27/2014   Procedure: PERCUTANEOUS CORONARY STENT INTERVENTION (PCI-S);  Surgeon: Runell Gess, MD; 3 mm x 12 mm long Xience Xpedition DES to the proximal LAD   SAVORY DILATION N/A 06/13/2015   Procedure: SAVORY DILATION;  Surgeon: Jeani Hawking, MD;  Location: [facility] ENDOSCOPY;  Service: Endoscopy;  Laterality: N/A;   SAVORY DILATION N/A 04/07/2018   Procedure: SAVORY DILATION;  Surgeon: Jeani Hawking, MD;  Location: [facility] ENDOSCOPY;  Service: Endoscopy;  Laterality: N/A;   TEE WITHOUT CARDIOVERSION N/A 08/17/2017   Procedure: TRANSESOPHAGEAL ECHOCARDIOGRAM (TEE);  Surgeon: Loreli Slot, MD;  Location: [facility] OR;  Service: Open Heart Surgery;  Laterality: N/A;   TEE WITHOUT CARDIOVERSION N/A 07/16/2021   Procedure:  TRANSESOPHAGEAL ECHOCARDIOGRAM (TEE);  Surgeon: Christell Constant, MD;  Location: [facility] ENDOSCOPY;  Service: Cardiovascular;  Laterality: N/A;   TONSILLECTOMY     TOTAL KNEE ARTHROPLASTY Left 2003   TOTAL KNEE REVISION Left 11/05/2013   Procedure: LEFT TOTAL KNEE RESECTION;  Surgeon: Shelda Pal, MD;  Location: [facility] ORS;  Service: Orthopedics;  Laterality: Left;   TOTAL KNEE REVISION Left 2007   opened in 2006 and cleaned out, 2007 revision   TOTAL KNEE REVISION Left 12/31/2013   Procedure: RE-INPLANTATION LEFT TOTAL KNEE ;  Surgeon: Shelda Pal, MD;  Location: [facility] ORS;  Service: Orthopedics;  Laterality: Left;   VIDEO ASSISTED THORACOSCOPY (VATS)/ LOBECTOMY  01/2009     A IV Location/Drains/Wounds Patient Lines/Drains/Airways Status     Active Line/Drains/Airways      Name Placement date Placement time Site Days   Peripheral IV 04/09/23 20 G Right Arm 04/09/23  2121  Arm  1   Wound / Incision (Open or Dehisced) 10/04/22 Venous stasis ulcer Leg Right;Lower;Posterior 10/04/22  0800  Leg  188   Wound / Incision (Open or Dehisced) 10/04/22 Venous stasis ulcer Tibial Left;Posterior 10/04/22  0800  Tibial  188            Intake/Output Last 24 hours  Intake/Output Summary (Last 24 hours) at 04/10/2023 0140 Last data filed at 04/09/2023 2242 Gross per 24 hour  Intake 54 ml  Output --  Net 54 ml    Labs/Imaging Results for orders placed or performed during the hospital encounter of 04/09/23 (from the past 48 hour(s))  Basic metabolic panel     Status: Abnormal   Collection Time: 04/09/23  9:20 PM  Result Value Ref Range   Sodium 135 135 - 145 mmol/L   Potassium 4.1 3.5 - 5.1 mmol/L   Chloride 91 (L) 98 - 111 mmol/L   CO2 25 22 - 32 mmol/L   Glucose, Bld 262 (H) 70 - 99 mg/dL    Comment: Glucose reference range applies only to samples taken after fasting for at least 8 hours.   BUN 33 (H) 8 - 23 mg/dL   Creatinine, Ser 1.02 (H) 0.44 - 1.00 mg/dL   Calcium 72.5 (H) 8.9 - 10.3 mg/dL   GFR, Estimated 19 (L) >60 mL/min    Comment: (NOTE) Calculated using the CKD-EPI Creatinine Equation (2021)    Anion gap 19 (H) 5 - 15    Comment: Performed at [facility], [address] [place], [place] [address]  CBC     Status: Abnormal   Collection Time: 04/09/23  9:20 PM  Result Value Ref Range   WBC 9.6 4.0 - 10.5 K/uL   RBC 3.97 3.87 - 5.11 MIL/uL   Hemoglobin 11.7 (L) 12.0 - 15.0 g/dL   HCT 03.4 (L) 74.2 - 59.5 %   MCV 89.4 80.0 - 100.0 fL   MCH 29.5 26.0 - 34.0 pg   MCHC 33.0 30.0 - 36.0 g/dL   RDW 63.8 75.6 - 43.3 %   Platelets 244 150 - 400 K/uL   nRBC 0.0 0.0 - 0.2 %    Comment: Performed at [facility], [address] [place], [place] [address]  Troponin I (High Sensitivity)     Status: None   Collection Time: 04/09/23  9:20 PM   Result Value Ref Range   Troponin I (High Sensitivity) 10 <18 ng/L    Comment: (NOTE) Elevated high sensitivity troponin I (hsTnI) values and significant  changes  across serial measurements may suggest ACS but many other  chronic and acute conditions are known to elevate hsTnI results.  Refer to the "Links" section for chest pain algorithms and additional  guidance. Performed at [facility], [address], [place], [place] [address]   Lactic acid, plasma     Status: None   Collection Time: 04/10/23 12:07 AM  Result Value Ref Range   Lactic Acid, Venous 1.5 0.5 - 1.9 mmol/L    Comment: Performed at [facility], [address] [place], [place] [address]  Troponin I (High Sensitivity)     Status: None   Collection Time: 04/10/23 12:07 AM  Result Value Ref Range   Troponin I (High Sensitivity) 8 <18 ng/L    Comment: (NOTE) Elevated high sensitivity troponin I (hsTnI) values and significant  changes across serial measurements may suggest ACS but many other  chronic and acute conditions are known to elevate hsTnI results.  Refer to the "Links" section for chest pain algorithms and additional  guidance. Performed at [facility], [address] [place], [place] [address]   Beta-hydroxybutyric acid     Status: None   Collection Time: 04/10/23 12:07 AM  Result Value Ref Range   Beta-Hydroxybutyric Acid 0.18 0.05 - 0.27 mmol/L    Comment: Performed at [facility], [address], [place], [place] [address]   CT ABDOMEN PELVIS WO CONTRAST  Result Date: 04/09/2023 CLINICAL DATA:  Abdominal pain.  Bowel obstruction suspected. EXAM: CT ABDOMEN AND PELVIS WITHOUT CONTRAST TECHNIQUE: Multidetector CT imaging of the abdomen and pelvis was performed following the standard protocol without IV contrast. RADIATION DOSE REDUCTION: This exam was performed according to the departmental dose-optimization program which includes automated exposure control, adjustment of the  mA and/or kV according to patient size and/or use of iterative reconstruction technique. COMPARISON:  CT abdomen and pelvis 10/08/2010 FINDINGS: Lower chest: No acute abnormality. Hepatobiliary: Cholecystectomy. Unremarkable liver and biliary tree. Pancreas: Unremarkable. Spleen: Unremarkable. Adrenals/Urinary Tract: Normal adrenal glands. No urinary calculi or hydronephrosis. Unremarkable bladder. Stomach/Bowel: Normal caliber large and small bowel. Colonic diverticulosis without diverticulitis. Normal appendix. Stomach is within normal limits. Vascular/Lymphatic: Aortic atherosclerosis. No enlarged abdominal or pelvic lymph nodes. Reproductive: Status post hysterectomy. No adnexal masses. Other: No free intraperitoneal fluid or air. Musculoskeletal: No acute osseous abnormality. IMPRESSION: 1. No acute abnormality in the abdomen or pelvis. Aortic Atherosclerosis (ICD10-I70.0). Electronically Signed   By: Minerva Fester M.D.   On: 04/09/2023 23:48   DG Chest 2 View  Result Date: 04/09/2023 CLINICAL DATA:  Chest pain EXAM: CHEST - 2 VIEW COMPARISON:  Radiographs 10/01/2022 FINDINGS: Stable cardiomediastinal silhouette. Sternotomy and CABG. Coronary stenting. Hazy opacity in the left lower lung is likely due to soft tissue overlap. The lungs are clear. No definite acute displaced rib fracture. IMPRESSION: No active cardiopulmonary disease. Electronically Signed   By: Minerva Fester M.D.   On: 04/09/2023 22:01    Pending Labs Unresulted Labs (From admission, onward)     Start     Ordered   04/10/23 0500  Hemoglobin A1c  Tomorrow morning,   R       Comments: To assess prior glycemic control    04/10/23 0129   04/10/23 0500  Protime-INR  Tomorrow morning,   R        04/10/23 0129   04/10/23 0500  Hepatic function panel  Tomorrow morning,   R        04/10/23 0129  04/10/23 0500  Lipase, blood  Tomorrow morning,   R        04/10/23 0129   04/10/23 0500  Basic metabolic panel  Daily,   R       04/10/23 0129   04/10/23 0500  CBC  Daily,   R      04/10/23 0129            Vitals/Pain Today's Vitals   04/09/23 2200 04/09/23 2300 04/10/23 0000 04/10/23 0119  BP: (!) 141/52 (!) 123/49  (!) 145/64  Pulse: (!) 104 81 97 87  Resp: 14 15 17 17   Temp:    97.6 F (36.4 C)  TempSrc:    Oral  SpO2: 100% 100% 100% 100%  Weight:      Height:      PainSc:    0-No pain    Isolation Precautions No active isolations  Medications Medications  metoprolol succinate (TOPROL-XL) 24 hr tablet 50 mg (has no administration in time range)  rosuvastatin (CRESTOR) tablet 10 mg (has no administration in time range)  pantoprazole (PROTONIX) EC tablet 40 mg (has no administration in time range)  insulin glargine-yfgn (SEMGLEE) injection 10 Units (has no administration in time range)  insulin aspart (novoLOG) injection 0-6 Units (has no administration in time range)  acetaminophen (TYLENOL) tablet 650 mg (has no administration in time range)    Or  acetaminophen (TYLENOL) suppository 650 mg (has no administration in time range)  fentaNYL (SUBLIMAZE) injection 12.5-25 mcg (has no administration in time range)  prochlorperazine (COMPAZINE) injection 5 mg (has no administration in time range)  famotidine (PEPCID) tablet 20 mg (20 mg Oral Given 04/09/23 2159)  alum & mag hydroxide-simeth (MAALOX/MYLANTA) 200-200-20 MG/5ML suspension 30 mL (30 mLs Oral Given 04/09/23 2159)    And  lidocaine (XYLOCAINE) 2 % viscous mouth solution 15 mL (15 mLs Oral Given 04/09/23 2159)  metoCLOPramide (REGLAN) 20 mg in dextrose 5 % 50 mL IVPB (0 mg Intravenous Stopped 04/09/23 2242)  lactated ringers bolus 1,000 mL (1,000 mLs Intravenous New Bag/Given 04/10/23 0004)    Mobility walks with device     Focused Assessments Renal Assessment Handoff:  Hemodialysis Schedule:   Last Hemodialysis date and time:    Restricted appendage:     R Recommendations: See Admitting Provider Note  Report given to:    Additional Notes:

## 2023-04-10 NOTE — ED Provider Notes (Signed)
  Physical Exam  BP (!) 145/64 (BP Location: Right Arm)   Pulse 87   Temp 97.6 F (36.4 C) (Oral)   Resp 17   Ht 5\' 3"  (1.6 m)   Wt 110.2 kg   SpO2 100%   BMI 43.05 kg/m   Physical Exam  Procedures  Procedures  ED Course / MDM   Clinical Course as of 04/10/23 0120  Sat Apr 09, 2023  2133 Stable 74 YOF with a chief complaint of CAD s/p CABG 5 days of chest discomfort that is postprandial High risk for CAD ACS eval and reassess  [CC]  2226 DG Chest 2 View No evidence of pneumonia [BB]  2234 Troponin I (High Sensitivity): 10 [BB]  2234 Basic metabolic panel(!) AKI, baseline appears to be 1.3.  Additionally, patient appears to have an anion gap.  Will plan to give fluid resuscitation, change abdomen pelvis to scan without contrast, and add a lactic acid for further delineation of anion gap. [BB]    Clinical Course User Index [BB] Fayrene Helper, MD [CC] Glyn Ade, MD   Medical Decision Making Amount and/or Complexity of Data Reviewed Labs: ordered. Decision-making details documented in ED Course. Radiology: ordered. Decision-making details documented in ED Course.  Risk OTC drugs. Prescription drug management. Decision regarding hospitalization.   Patient care assumed from Dr. Willeen Cass at shift handoff. Please see his note for full details.  In short, female patient with past medical history significant for CHF, CAD, GERD presents to the emergency department for evaluation of nausea, vomiting, dyspepsia.  She had a single episode of emesis on Tuesday with an additional episode on Wednesday.  She has had no further episodes of emesis. She normally takes Reglan but has not seen her GI doctor recently and is out.   Prior to my assumption of care lab work was remarkable for AKI and anion gap. CT abdomen pelvis, beta hydroxybutyric acid, lactic acid ordered.   CT abdomen pelvis without contrast due to AKI shows no acute abnormality Troponins downtrending from 10 to 8.   No lactic acidosis.   Discussed case with Dr.Opyd who agrees to see patient for admission due to AKI       Pamala Duffel 04/10/23 Earleen Newport, MD 04/10/23 [phone]

## 2023-04-11 DIAGNOSIS — I5032 Chronic diastolic (congestive) heart failure: Secondary | ICD-10-CM | POA: Diagnosis not present

## 2023-04-11 DIAGNOSIS — R1310 Dysphagia, unspecified: Secondary | ICD-10-CM

## 2023-04-11 DIAGNOSIS — N179 Acute kidney failure, unspecified: Secondary | ICD-10-CM | POA: Diagnosis not present

## 2023-04-11 DIAGNOSIS — E1169 Type 2 diabetes mellitus with other specified complication: Secondary | ICD-10-CM | POA: Diagnosis not present

## 2023-04-11 DIAGNOSIS — I2510 Atherosclerotic heart disease of native coronary artery without angina pectoris: Secondary | ICD-10-CM | POA: Diagnosis not present

## 2023-04-11 LAB — COMPREHENSIVE METABOLIC PANEL
ALT: 16 U/L (ref 0–44)
AST: 18 U/L (ref 15–41)
Albumin: 2.9 g/dL — ABNORMAL LOW (ref 3.5–5.0)
Alkaline Phosphatase: 69 U/L (ref 38–126)
Anion gap: 6 (ref 5–15)
BUN: 23 mg/dL (ref 8–23)
CO2: 25 mmol/L (ref 22–32)
Calcium: 9.2 mg/dL (ref 8.9–10.3)
Chloride: 102 mmol/L (ref 98–111)
Creatinine, Ser: 1.69 mg/dL — ABNORMAL HIGH (ref 0.44–1.00)
GFR, Estimated: 32 mL/min — ABNORMAL LOW (ref >60)
Glucose, Bld: 103 mg/dL — ABNORMAL HIGH (ref 70–99)
Potassium: 3.3 mmol/L — ABNORMAL LOW (ref 3.5–5.1)
Sodium: 133 mmol/L — ABNORMAL LOW (ref 135–145)
Total Bilirubin: 0.7 mg/dL (ref 0.3–1.2)
Total Protein: 6.7 g/dL (ref 6.5–8.1)

## 2023-04-11 LAB — PHOSPHORUS: Phosphorus: 2.6 mg/dL (ref 2.5–4.6)

## 2023-04-11 LAB — MAGNESIUM: Magnesium: 2.1 mg/dL (ref 1.7–2.4)

## 2023-04-11 LAB — CBC WITH DIFFERENTIAL/PLATELET
Abs Immature Granulocytes: 0.03 10*3/uL (ref 0.00–0.07)
Basophils Absolute: 0 10*3/uL (ref 0.0–0.1)
Basophils Relative: 0 %
Eosinophils Absolute: 0.2 10*3/uL (ref 0.0–0.5)
Eosinophils Relative: 2 %
HCT: 31.7 % — ABNORMAL LOW (ref 36.0–46.0)
Hemoglobin: 10.5 g/dL — ABNORMAL LOW (ref 12.0–15.0)
Immature Granulocytes: 0 %
Lymphocytes Relative: 19 %
Lymphs Abs: 1.5 10*3/uL (ref 0.7–4.0)
MCH: 29.7 pg (ref 26.0–34.0)
MCHC: 33.1 g/dL (ref 30.0–36.0)
MCV: 89.5 fL (ref 80.0–100.0)
Monocytes Absolute: 0.5 10*3/uL (ref 0.1–1.0)
Monocytes Relative: 7 %
Neutro Abs: 5.5 10*3/uL (ref 1.7–7.7)
Neutrophils Relative %: 72 %
Platelets: 201 10*3/uL (ref 150–400)
RBC: 3.54 MIL/uL — ABNORMAL LOW (ref 3.87–5.11)
RDW: 12.5 % (ref 11.5–15.5)
WBC: 7.7 10*3/uL (ref 4.0–10.5)
nRBC: 0 % (ref 0.0–0.2)

## 2023-04-11 LAB — GLUCOSE, CAPILLARY
Glucose-Capillary: 54 mg/dL — ABNORMAL LOW (ref 70–99)
Glucose-Capillary: 126 mg/dL — ABNORMAL HIGH (ref 70–99)
Glucose-Capillary: 137 mg/dL — ABNORMAL HIGH (ref 70–99)
Glucose-Capillary: 155 mg/dL — ABNORMAL HIGH (ref 70–99)
Glucose-Capillary: 218 mg/dL — ABNORMAL HIGH (ref 70–99)
Glucose-Capillary: 296 mg/dL — ABNORMAL HIGH (ref 70–99)

## 2023-04-11 LAB — RETICULOCYTES
Immature Retic Fract: 6.7 % (ref 2.3–15.9)
RBC.: 3.53 MIL/uL — ABNORMAL LOW (ref 3.87–5.11)
Retic Count, Absolute: 72 10*3/uL (ref 19.0–186.0)
Retic Ct Pct: 2 % (ref 0.4–3.1)

## 2023-04-11 LAB — IRON AND TIBC
Iron: 52 ug/dL (ref 28–170)
Saturation Ratios: 18 % (ref 10.4–31.8)
TIBC: 284 ug/dL (ref 250–450)
UIBC: 232 ug/dL

## 2023-04-11 LAB — FOLATE: Folate: 12.8 ng/mL (ref >5.9)

## 2023-04-11 LAB — VITAMIN B12: Vitamin B-12: 465 pg/mL (ref 180–914)

## 2023-04-11 LAB — PROTIME-INR
INR: 2.7 — ABNORMAL HIGH (ref 0.8–1.2)
Prothrombin Time: 28.9 seconds — ABNORMAL HIGH (ref 11.4–15.2)

## 2023-04-11 LAB — FERRITIN: Ferritin: 133 ng/mL (ref 11–307)

## 2023-04-11 MED ORDER — INSULIN ASPART 100 UNIT/ML IJ SOLN
0.0000 [IU] | Freq: Every day | INTRAMUSCULAR | Status: DC
  Administered 2023-04-11: 3 [IU] via SUBCUTANEOUS

## 2023-04-11 MED ORDER — ALUM & MAG HYDROXIDE-SIMETH 200-200-20 MG/5ML PO SUSP
30.0000 mL | Freq: Four times a day (QID) | ORAL | Status: DC | PRN
  Administered 2023-04-11: 30 mL via ORAL
  Filled 2023-04-11: qty 30

## 2023-04-11 MED ORDER — SODIUM CHLORIDE 0.9 % IV SOLN: INTRAVENOUS | Status: DC

## 2023-04-11 MED ORDER — WARFARIN SODIUM 2.5 MG PO TABS
3.7500 mg | ORAL_TABLET | Freq: Once | ORAL | Status: AC
  Administered 2023-04-11: 3.75 mg via ORAL
  Filled 2023-04-11: qty 1

## 2023-04-11 MED ORDER — INSULIN ASPART 100 UNIT/ML IJ SOLN
0.0000 [IU] | Freq: Three times a day (TID) | INTRAMUSCULAR | Status: DC
  Administered 2023-04-12: 2 [IU] via SUBCUTANEOUS

## 2023-04-11 NOTE — Progress Notes (Signed)
Hypoglycemic Event  CBG: 54 @ 0512  Treatment: 8 oz juice/soda  Symptoms: None  Follow-up CBG: Time:0533 CBG Result:126  Possible Reasons for Event: Clear Liquid Diet  Comments/MD notified:Gave 8 oz Apple Juice   Bernie Covey RN

## 2023-04-11 NOTE — Progress Notes (Signed)
PROGRESS NOTE    [name]  ZOX:[id number] DOB: 1951/02/22 DOA: 04/09/2023 PCP: Georgianne Fick, MD   Brief Narrative:  The patient is a pleasant 72 year old African-American female with past medical history significant for but not limited to insulin-dependent diabetes mellitus type 2, CAD status post CABG chronic heart failure with preserved ejection fraction and diastolic dysfunction as well as history of atrial fibrillation on anticoagulation with warfarin, OSA not on CPAP as well as history of CKD stage IIIb and other comorbidities who presented with epigastric pain, nausea and vomiting.  She reports a long history of acid reflux and takes Protonix daily and uses over-the-counter antiacid as needed.  She reports that she has a history of gastroparesis attributed to her diabetes and has been previously taking Reglan regularly but ran out.  Had an EGD done in 2019 with a benign-appearing stenosis that was dilated as well as a hiatal hernia and a normal-appearing stomach and duodenum.  She continued to have epigastric pain, nausea vomiting which she experienced in the past with as having it last longer and has an appointment to see her GI doctor Dr. [name] on 04/13/2013.  She came to the ED and she was afebrile and was found to have an AKI and no other acute findings on chest x-ray or CT scan of the abdomen pelvis.  She was given a 1 L bolus of LR, Pepcid, GI cocktail and Reglan and admitted for further evaluation.  She was placed on a clear liquid diet and will advance diet slowly and continue to hydrate the patient with normal saline at 100 MLS per hour for 15 hours total and this expired but we will renew at 75 MLS per hour.  She had issues with trouble swallowing some of her pills so SLP evaluated and felt that she had esophageal dysphagia and recommended GI consultation.  GI was consulted and I spoke with Dr. Ledell Noss spoke with the patient and she will follow the patient in outpatient setting.   Anticipating discharging home in the next 24 to 48 hours if she tolerates her soft diet and if renal function continues to improve.  Assessment and Plan:  AKI on CKD Stage 3b -BUN/Cr Trend: Recent Labs  Lab 04/09/23 2120 04/10/23 0235 04/11/23 0141  BUN 33* 31* 23  CREATININE 2.58* 2.44* 1.69*  -Continued NS at 100 mL/hr for 15 hours and expired however will renew at 75 MLS per hour with normal saline hydrate through the night -Avoid Nephrotoxic Medications, Contrast Dyes, Hypotension and Dehydration to Ensure Adequate Renal Perfusion and will need to Renally Adjust Meds -Continue to Monitor and Trend Renal Function carefully and repeat CMP in the AM   Hyponatremia -Na+ Trend:  Recent Labs  Lab 04/09/23 2120 04/10/23 0235 04/11/23 0141  NA 135 132* 133*  -NS at 100 mL/hr has finished but will renew at 75 MLS per hour and hydrate her overnight -Continue to Monitor and Trend and repeat CMP in the AM  Abdominal pain, N/V and Diarrhea -No acute findings on CT and showed "No acute abnormality in the abdomen or pelvis. Aortic Atherosclerosis"; exam benign  -She reports hx of GERD and gastroparesis and is scheduled to see GI on 04/14/23  -Check LFTs and lipase; lipase was slightly elevated at 63 and LFTs were normalized -Allow clears and advance as tolerated and will go to soft today -Continue symptomatic treatments with IVF and Antiemetics -Checking C Difficile per Protocol and was negative so we will discontinue enteric precautions  Dysphagia -  Patient is having trouble swallowing her pills even with applesauce -Check SLP and the feel her swallowing issues are related to her GERD/esophageal dysphagia and are not concerned with aspiration or any pharyngeal phase impairments.  -Speech therapy is recommended a GI consultation or an esophagram and trying her on a soft diet. -Work with GI who went to evaluate the patient but the patient is no longer nauseous and spoke to the patient at  length and recommending that the patient be followed up in the outpatient clinic at her regular scheduled appointment on 04/14/2023.   CAD status post CABG -No anginal complaints   -Continue beta-blocker and statin     PAF  -Continue Warfarin with Pharmacy to Dose -C/w Metoprolol Succinate 50 mg po Daily   Chronic HFpEF  -Holding diuretics as above  -Getting Gentle IVF Hydration with 75 MLS per hour now -Strict I's and O's and Daily Weights;  Intake/Output Summary (Last 24 hours) at 04/11/2023 1755 Last data filed at 04/11/2023 0900 Gross per 24 hour  Intake 720 ml  Output 2875 ml  Net -2155 ml  -Continue to Monitor for S/Sx of Volume Overload as we are weaning IV fluid hydration at 75 MLS per hour   Hypertension  -Hold lisinopril-hydrochlorothiazide as above  -Continue to Monitor and BP per Protocol  -Last BP reading was 123/45  Uncontrolled Insulin Dependent Diabetes Mellitus Type 2 -HbA1c was 8.6 this visit and was 8.8 in January 2024 -CBG and Glucose Trend: Recent Labs  Lab 04/09/23 2120 04/10/23 0228 04/10/23 0235 04/10/23 0729 04/11/23 0141 04/11/23 0512 04/11/23 1605  GLUCOSE 262*  --  218*  --  103*  --   --   GLUCAP  --    < >  --    < >  --    < > 218*   < > = values in this interval not displayed.  -C/w Semglee 10 units sq Daily and Very Sensitive Novolog Insulin 0-6 units  Normocytic Anemia -Hgb/Hct Trend: Recent Labs  Lab 04/09/23 2120 04/10/23 0235 04/11/23 0141  HGB 11.7* 11.0* 10.5*  HCT 35.5* 33.8* 31.7*  MCV 89.4 88.0 89.5  -Checked Anemia Panel showed an iron level of 52, UIBC of 232, TIBC of 284, saturation ratios of 18%, ferritin level of 133, follow-up with 12.8, vitamin B12 level 465 -Continue to Monitor for S/Sx of Bleeding; No overt bleeding noted -Repeat CBC in the AM   GERD/GI Prophylaxis and Gastroparesis -C/w Pantoprazole 40 mg po BID -Given Metaclopramide 20 mg IV x1 -May discuss with her Primary GI Physician    Hypoalbuminemia -Patient's Albumin Trend: Recent Labs  Lab 04/10/23 0235 04/11/23 0141  ALBUMIN 3.3* 2.9*  -Continue to Monitor and Trend and repeat CMP in the AM  Morbid Obesity -Complicates overall prognosis and care -Estimated body mass index is 43.05 kg/m as calculated from the following:   Height as of this encounter: 5\' 3"  (1.6 m).   Weight as of this encounter: 110.2 kg.  -Weight Loss and Dietary Counseling given   DVT prophylaxis: Anticoagulated with Coumadin    Code Status: Full Code Family Communication: No family present at bedside  Disposition Plan:  Level of care: Med-Surg Status is: Inpatient Remains inpatient appropriate because: Needs to tolerate a Diet and needs further clinical improvement in her Renal Fxn   Consultants:  Discussed with Gastroenterology   Procedures:  As delineated as above  Antimicrobials:  Anti-infectives (From admission, onward)    None  Subjective: Seen and examined and think she is doing better.  States her nausea vomiting is resolved.  Hoping that her diet can be advanced.  Apparently she choked when eating her pills in applesauce so SLP was consulted and they feel that she has esophageal dysphagia.  Case was discussed with GI who feels that she can continue on her diet and follow-up in our clinic.  She denies any lightheadedness or dizziness and renal function continues to improve.  Objective: Vitals:   04/10/23 2141 04/11/23 0514 04/11/23 0802 04/11/23 1603  BP: (!) 119/42 (!) 122/46 (!) 145/58 (!) 123/45  Pulse: 77 76 96 77  Resp: 18 18 16 16   Temp: (!) 97.4 F (36.3 C) 98.2 F (36.8 C) (!) 97.5 F (36.4 C) 97.8 F (36.6 C)  TempSrc: Oral Oral    SpO2: 99% 100% 99% 100%  Weight:      Height:        Intake/Output Summary (Last 24 hours) at 04/11/2023 1805 Last data filed at 04/11/2023 0900 Gross per 24 hour  Intake 720 ml  Output 2875 ml  Net -2155 ml   Filed Weights   04/09/23 2115  Weight: 110.2  kg   Examination: Physical Exam:  Constitutional: WN/WD morbidly obese AAF in NAD Respiratory: Diminished to auscultation bilaterally, no wheezing, rales, rhonchi or crackles. Normal respiratory effort and patient is not tachypenic. No accessory muscle use. Unlabored breathing  Cardiovascular: RRR, no murmurs / rubs / gallops. S1 and S2 auscultated.  Mild LE edema Abdomen: Soft, non-tender, non-distended. No masses palpated. No appreciable hepatosplenomegaly. Bowel sounds positive.  GU: Deferred. Musculoskeletal: No clubbing / cyanosis of digits/nails. No joint deformity upper and lower extremities.   Skin: No rashes, lesions, ulcers on a limited skin evaluation. No induration; Warm and dry.  Neurologic: CN 2-12 grossly intact with no focal deficits. Romberg sign and cerebellar reflexes not assessed.  Psychiatric: Normal judgment and insight. Alert and oriented x 3. Normal mood and appropriate affect.   Data Reviewed: I have personally reviewed following labs and imaging studies  CBC: Recent Labs  Lab 04/09/23 2120 04/10/23 0235 04/11/23 0141  WBC 9.6 10.2 7.7  NEUTROABS  --   --  5.5  HGB 11.7* 11.0* 10.5*  HCT 35.5* 33.8* 31.7*  MCV 89.4 88.0 89.5  PLT 244 214 201   Basic Metabolic Panel: Recent Labs  Lab 04/09/23 2120 04/10/23 0235 04/11/23 0141  NA 135 132* 133*  K 4.1 3.6 3.3*  CL 91* 97* 102  CO2 25 25 25   GLUCOSE 262* 218* 103*  BUN 33* 31* 23  CREATININE 2.58* 2.44* 1.69*  CALCIUM 10.4* 9.8 9.2  MG  --   --  2.1  PHOS  --   --  2.6   GFR: Estimated Creatinine Clearance: 35.3 mL/min (A) (by C-G formula based on SCr of 1.69 mg/dL (H)). Liver Function Tests: Recent Labs  Lab 04/10/23 0235 04/11/23 0141  AST 20 18  ALT 16 16  ALKPHOS 77 69  BILITOT 0.8 0.7  PROT 7.5 6.7  ALBUMIN 3.3* 2.9*   Recent Labs  Lab 04/10/23 0235  LIPASE 63*   No results for input(s): "AMMONIA" in the last 168 hours. Coagulation Profile: Recent Labs  Lab 04/10/23 0235  04/11/23 0141  INR 2.5* 2.7*   Cardiac Enzymes: No results for input(s): "CKTOTAL", "CKMB", "CKMBINDEX", "TROPONINI" in the last 168 hours. BNP (last 3 results) No results for input(s): "PROBNP" in the last 8760 hours. HbA1C: Recent Labs  04/10/23 0235  HGBA1C 8.6*   CBG: Recent Labs  Lab 04/11/23 0512 04/11/23 0533 04/11/23 0721 04/11/23 1106 04/11/23 1605  GLUCAP 54* 126* 137* 155* 218*   Lipid Profile: No results for input(s): "CHOL", "HDL", "LDLCALC", "TRIG", "CHOLHDL", "LDLDIRECT" in the last 72 hours. Thyroid Function Tests: No results for input(s): "TSH", "T4TOTAL", "FREET4", "T3FREE", "THYROIDAB" in the last 72 hours. Anemia Panel: Recent Labs    04/11/23 0141  VITAMINB12 465  FOLATE 12.8  FERRITIN 133  TIBC 284  IRON 52  RETICCTPCT 2.0   Sepsis Labs: Recent Labs  Lab 04/10/23 0007  LATICACIDVEN 1.5   Recent Results (from the past 240 hour(s))  C Difficile Quick Screen w PCR reflex     Status: None   Collection Time: 04/10/23  8:28 PM   Specimen: STOOL  Result Value Ref Range Status   C Diff antigen NEGATIVE NEGATIVE Final   C Diff toxin NEGATIVE NEGATIVE Final   C Diff interpretation No C. difficile detected.  Final    Comment: Performed at [facility], [address], [place], [place] [address]    Radiology Studies: CT ABDOMEN PELVIS WO CONTRAST  Result Date: 04/09/2023 CLINICAL DATA:  Abdominal pain.  Bowel obstruction suspected. EXAM: CT ABDOMEN AND PELVIS WITHOUT CONTRAST TECHNIQUE: Multidetector CT imaging of the abdomen and pelvis was performed following the standard protocol without IV contrast. RADIATION DOSE REDUCTION: This exam was performed according to the departmental dose-optimization program which includes automated exposure control, adjustment of the mA and/or kV according to patient size and/or use of iterative reconstruction technique. COMPARISON:  CT abdomen and pelvis 10/08/2010 FINDINGS: Lower chest: No acute  abnormality. Hepatobiliary: Cholecystectomy. Unremarkable liver and biliary tree. Pancreas: Unremarkable. Spleen: Unremarkable. Adrenals/Urinary Tract: Normal adrenal glands. No urinary calculi or hydronephrosis. Unremarkable bladder. Stomach/Bowel: Normal caliber large and small bowel. Colonic diverticulosis without diverticulitis. Normal appendix. Stomach is within normal limits. Vascular/Lymphatic: Aortic atherosclerosis. No enlarged abdominal or pelvic lymph nodes. Reproductive: Status post hysterectomy. No adnexal masses. Other: No free intraperitoneal fluid or air. Musculoskeletal: No acute osseous abnormality. IMPRESSION: 1. No acute abnormality in the abdomen or pelvis. Aortic Atherosclerosis (ICD10-I70.0). Electronically Signed   By: Minerva Fester M.D.   On: 04/09/2023 23:48   DG Chest 2 View  Result Date: 04/09/2023 CLINICAL DATA:  Chest pain EXAM: CHEST - 2 VIEW COMPARISON:  Radiographs 10/01/2022 FINDINGS: Stable cardiomediastinal silhouette. Sternotomy and CABG. Coronary stenting. Hazy opacity in the left lower lung is likely due to soft tissue overlap. The lungs are clear. No definite acute displaced rib fracture. IMPRESSION: No active cardiopulmonary disease. Electronically Signed   By: Minerva Fester M.D.   On: 04/09/2023 22:01    Scheduled Meds:  insulin aspart  0-5 Units Subcutaneous QHS   [START ON 04/12/2023] insulin aspart  0-9 Units Subcutaneous TID WC   insulin glargine-yfgn  10 Units Subcutaneous Daily   metoprolol succinate  50 mg Oral Daily   pantoprazole  40 mg Oral BID   rosuvastatin  10 mg Oral Daily   Warfarin - Pharmacist Dosing Inpatient   Does not apply q1600   Continuous Infusions:  sodium chloride 75 mL/hr at 04/11/23 1757    LOS: 1 day   Marguerita Merles, DO [facility] Available via Epic secure chat 7am-7pm After these hours, please refer to coverage provider listed on [email or web address] 04/11/2023, 6:05 PM

## 2023-04-11 NOTE — Inpatient Diabetes Management (Signed)
Inpatient Diabetes Program Recommendations  AACE/ADA: New Consensus Statement on Inpatient Glycemic Control  Target Ranges:  Prepandial:   less than 140 mg/dL      Peak postprandial:   less than 180 mg/dL (1-2 hours)      Critically ill patients:  140 - 180 mg/dL     Latest Reference Range & Units 04/10/23 07:29 04/10/23 11:26 04/10/23 16:32 04/10/23 21:41 04/10/23 23:42 04/11/23 05:12 04/11/23 05:33 04/11/23 07:21 04/11/23 11:06  Glucose-Capillary 70 - 99 mg/dL 161 (H) 096 (H) 045 (H) 238 (H) 175 (H) 54 (L) 126 (H) 137 (H) 155 (H)   Review of Glycemic Control  Diabetes history: DM2 Outpatient Diabetes medications: Ozempic Qweek Current orders for Inpatient glycemic control: Semglee 10 units daily, Novolog 0-6 units Q4H  Inpatient Diabetes Program Recommendations:    Insulin: CBG down to 54 mg/dl at 4:09 am (following Novolog correction).  May want to consider changing frequency of CBGs and Semglee to AC&HS.  Thanks, Orlando Penner, RN, MSN, CDCES Diabetes Coordinator Inpatient Diabetes Program [phone] (Team Pager from 8am to 5pm)

## 2023-04-11 NOTE — Progress Notes (Signed)
ANTICOAGULATION CONSULT NOTE - Follow Up Consult  Pharmacy Consult for Coumadin Indication: atrial fibrillation    Allergies  Allergen Reactions   Levofloxacin Itching   Morphine And Codeine Itching and Other (See Comments)    Pt reports oxycodone history with no allergy.    Patient Measurements: Height: 5\' 3"  (160 cm) Weight: 110.2 kg (243 lb) IBW/kg (Calculated) : 52.4  Vital Signs: Temp: 98.2 F (36.8 C) (07/15 0514) Temp Source: Oral (07/15 0514) BP: 122/46 (07/15 0514) Pulse Rate: 76 (07/15 0514)  Labs: Recent Labs    04/09/23 2120 04/10/23 0007 04/10/23 0235 04/11/23 0141  HGB 11.7*  --  11.0* 10.5*  HCT 35.5*  --  33.8* 31.7*  PLT 244  --  214 201  LABPROT  --   --  27.6* 28.9*  INR  --   --  2.5* 2.7*  CREATININE 2.58*  --  2.44* 1.69*  TROPONINIHS 10 8  --   --     Estimated Creatinine Clearance: 35.3 mL/min (A) (by C-G formula based on SCr of 1.69 mg/dL (H)).   Medications:  Scheduled:   insulin aspart  0-6 Units Subcutaneous Q4H   insulin glargine-yfgn  10 Units Subcutaneous Daily   metoprolol succinate  50 mg Oral Daily   pantoprazole  40 mg Oral BID   rosuvastatin  10 mg Oral Daily   Warfarin - Pharmacist Dosing Inpatient   Does not apply q1600   PRN: acetaminophen **OR** acetaminophen, fentaNYL (SUBLIMAZE) injection, mouth rinse, prochlorperazine  Assessment: 72 y.o. female admitted with abdominal pain, h/o atrial fibrillation, to continue warfarin. Patient reported home regimen as one tablet (7.5 mg) on Sun/Tues/Thurs and 1/2 tablet (3.75 mg) all other days. She reported her only recent missed dose was 7/13.   INR 2.7 - therapeutic  CBC: Hgb 10.5/Plt 201   Goal of Therapy:  INR 2-3 Monitor platelets by anticoagulation protocol: Yes   Plan:  Warfarin 3.75 mg x 1 today (as per PTA regimen) Daily PT/INR. CBC intermittently. Monitor for signs/symptoms of bleeding.   Calton Dach, PharmD Clinical Pharmacist 04/11/2023 7:24 AM

## 2023-04-11 NOTE — Evaluation (Signed)
Clinical/Bedside Swallow Evaluation Patient Details  Name: [name] MRN: [id number] Date of Birth: Feb 07, 1951  Today's Date: 04/11/2023 Time: SLP Start Time (ACUTE ONLY): 1345 SLP Stop Time (ACUTE ONLY): 1400 SLP Time Calculation (min) (ACUTE ONLY): 15 min  Past Medical History:  Past Medical History:  Diagnosis Date   Arthritis    "knees" (02/05/2016)   Cancer (HCC) 5/10   LUL Vats    CHF (congestive heart failure) (HCC)    Coronary artery disease    GERD (gastroesophageal reflux disease)    tx meds   H/O hiatal hernia    History of blood transfusion "several"   last april 2015 s/p knee surgery (02/05/2016)   Hypercholesteremia    Hypertension    Lung cancer (HCC)    2010, surgery 18% left lung no radiation or chemo   Non-STEMI (non-ST elevated myocardial infarction) (HCC) 04/07/2012   See cath results below   Paroxysmal atrial fibrillation (HCC) 04/07/2012   On Warfarin   Peripheral vascular disease (HCC) 8/12   Lt SFA PTA   Presence of stent in LAD coronary artery 04/07/2012   Xience Expedition DES 2.75 mm x 18 mm (dilated to 3.0 mm)   Sleep apnea    does not wear CPAP   Type II diabetes mellitus (HCC)    insulin dependent   Past Surgical History:  Past Surgical History:  Procedure Laterality Date   ABDOMINAL HYSTERECTOMY  1990   BREAST EXCISIONAL BIOPSY Right    CARDIAC CATHETERIZATION  11/04/2008   patent RCA, LM, and Circ, nl EF   CARDIAC CATHETERIZATION  02/20/2002   patent coronaries with the only abnormality being a smooth luminla irregularity in the mid intermediate ramus branch no felt to be hemodynamically significant, nl LV   CARDIAC CATHETERIZATION N/A 02/05/2016   Procedure: Left Heart Cath and Coronary Angiography;  Surgeon: Marykay Lex, MD;  Location: [facility] INVASIVE CV LAB;  Service: Cardiovascular;  Laterality: N/A;   CARDIAC CATHETERIZATION N/A 02/05/2016   Procedure: Coronary Stent Intervention;  Surgeon: Marykay Lex, MD;  Location: [facility]  INVASIVE CV LAB;  Service: Cardiovascular;  Laterality: N/A;   CARDIOVERSION N/A 07/16/2021   Procedure: CARDIOVERSION;  Surgeon: Christell Constant, MD;  Location: [facility] ENDOSCOPY;  Service: Cardiovascular;  Laterality: N/A;   CATARACT EXTRACTION W/ INTRAOCULAR LENS  IMPLANT, BILATERAL  2013   CORONARY ANGIOPLASTY WITH STENT PLACEMENT  04/07/2012   Xience Expedition DES 2.44mm x 18 mm (dilated to 3.0 mm) to the prox LAD    CORONARY ANGIOPLASTY WITH STENT PLACEMENT  2013; 2015   CORONARY ARTERY BYPASS GRAFT N/A 08/17/2017   Procedure: CORONARY ARTERY BYPASS GRAFTING (CABG) TIMES 1 USING LEFT INTERNAL MAMMARY ARTERY;  Surgeon: Loreli Slot, MD;  Location: [facility] OR;  Service: Open Heart Surgery;  Laterality: N/A;   DILATION AND CURETTAGE OF UTERUS  <1990   ESOPHAGOGASTRODUODENOSCOPY N/A 05/23/2015   Procedure: ESOPHAGOGASTRODUODENOSCOPY (EGD);  Surgeon: Jeani Hawking, MD;  Location: [facility] ENDOSCOPY;  Service: Endoscopy;  Laterality: N/A;   ESOPHAGOGASTRODUODENOSCOPY (EGD) WITH PROPOFOL N/A 06/13/2015   Procedure: ESOPHAGOGASTRODUODENOSCOPY (EGD) WITH PROPOFOL;  Surgeon: Jeani Hawking, MD;  Location: [facility] ENDOSCOPY;  Service: Endoscopy;  Laterality: N/A;   ESOPHAGOGASTRODUODENOSCOPY (EGD) WITH PROPOFOL N/A 04/07/2018   Procedure: ESOPHAGOGASTRODUODENOSCOPY (EGD) WITH PROPOFOL;  Surgeon: Jeani Hawking, MD;  Location: [facility] ENDOSCOPY;  Service: Endoscopy;  Laterality: N/A;  fluoroscopy is needed   FRACTURE SURGERY     HAMMER TOE SURGERY Bilateral 1993   with screws, on screw removed  JOINT REPLACEMENT     KNEE ARTHROSCOPY Bilateral    left x2, right x1   LAPAROSCOPIC CHOLECYSTECTOMY     LEFT HEART CATH AND CORONARY ANGIOGRAPHY N/A 08/11/2017   Procedure: LEFT HEART CATH AND CORONARY ANGIOGRAPHY;  Surgeon: Swaziland, Peter M, MD;  Location: [facility] INVASIVE CV LAB;  Service: Cardiovascular;  Laterality: N/A;   LEFT HEART CATHETERIZATION WITH CORONARY ANGIOGRAM N/A 04/07/2012   Procedure: LEFT HEART  CATHETERIZATION WITH CORONARY ANGIOGRAM;  Surgeon: Runell Gess, MD;  Location: [facility] CATH LAB;  Service: Cardiovascular;  Laterality: N/A;   LEFT HEART CATHETERIZATION WITH CORONARY ANGIOGRAM N/A 05/27/2014   Procedure: LEFT HEART CATHETERIZATION WITH CORONARY ANGIOGRAM;  Surgeon: Runell Gess, MD; LAD 99% ISR, CFX 50-60%, RCA (dominant) no sig dz, EF 60%   LOWER EXTREMITY ANGIOGRAM  05/17/11   directional atherectomy to the prox L SFA using a LX Man TurboHawk, ballooned with a [place] balloon    ORIF ANKLE FRACTURE  07/09/2012   Procedure: OPEN REDUCTION INTERNAL FIXATION (ORIF) ANKLE FRACTURE;  Surgeon: Cammy Copa, MD;  Location: [facility] ORS;  Service: Orthopedics;  Laterality: Left;  open reduction internal fixation trimalleolar ankle fracture medial malleolous fixation   PERCUTANEOUS CORONARY STENT INTERVENTION (PCI-S) N/A 04/07/2012   Procedure: PERCUTANEOUS CORONARY STENT INTERVENTION (PCI-S);  Surgeon: Runell Gess, MD;  Location: [facility] CATH LAB;  Service: Cardiovascular;  Laterality: N/A;   PERCUTANEOUS CORONARY STENT INTERVENTION (PCI-S)  05/27/2014   Procedure: PERCUTANEOUS CORONARY STENT INTERVENTION (PCI-S);  Surgeon: Runell Gess, MD; 3 mm x 12 mm long Xience Xpedition DES to the proximal LAD   SAVORY DILATION N/A 06/13/2015   Procedure: SAVORY DILATION;  Surgeon: Jeani Hawking, MD;  Location: [facility] ENDOSCOPY;  Service: Endoscopy;  Laterality: N/A;   SAVORY DILATION N/A 04/07/2018   Procedure: SAVORY DILATION;  Surgeon: Jeani Hawking, MD;  Location: [facility] ENDOSCOPY;  Service: Endoscopy;  Laterality: N/A;   TEE WITHOUT CARDIOVERSION N/A 08/17/2017   Procedure: TRANSESOPHAGEAL ECHOCARDIOGRAM (TEE);  Surgeon: Loreli Slot, MD;  Location: [facility] OR;  Service: Open Heart Surgery;  Laterality: N/A;   TEE WITHOUT CARDIOVERSION N/A 07/16/2021   Procedure: TRANSESOPHAGEAL ECHOCARDIOGRAM (TEE);  Surgeon: Christell Constant, MD;  Location: [facility] ENDOSCOPY;  Service: Cardiovascular;   Laterality: N/A;   TONSILLECTOMY     TOTAL KNEE ARTHROPLASTY Left 2003   TOTAL KNEE REVISION Left 11/05/2013   Procedure: LEFT TOTAL KNEE RESECTION;  Surgeon: Shelda Pal, MD;  Location: [facility] ORS;  Service: Orthopedics;  Laterality: Left;   TOTAL KNEE REVISION Left 2007   opened in 2006 and cleaned out, 2007 revision   TOTAL KNEE REVISION Left 12/31/2013   Procedure: RE-INPLANTATION LEFT TOTAL KNEE ;  Surgeon: Shelda Pal, MD;  Location: [facility] ORS;  Service: Orthopedics;  Laterality: Left;   VIDEO ASSISTED THORACOSCOPY (VATS)/ LOBECTOMY  01/2009   HPI:  Patient is a 72 y.o. female with PMH: IDDM, CAD s/p CABG, chronic HFpEF, a-fib on Warfarin, OSA not on CPAP and CKD 3B. She presented to the hospital on 04/10/23 with epigastric pain, nausea and vomiting. She has h/o hiatal hernia, mild stricture at GE junction, severe focal stricture in proximal esophagus at C5-6 level. She has had EGD with dilation three times with most recent being 2019. She had already made an OP GI appointment for 04/14/23 due to her reflux getting worse.    Assessment / Plan / Recommendation  Clinical Impression  Patient is not currently presenting with clinical s/s of oropharyngeal dysphagia but  is presenting with s/s of pharyngeal phase dysphagia. Swallow initiation was timely and no overt s/s aspiration with straw sips of water (thin consistency). She exhibited almost immediate belching after approximately 6 successive sips. SLP suspects that patient's current dysphagia symptoms are related to her h/o esophageal dysphagia and GERD. After discussion with MD, decision was made to advance her diet from full liquids to diet soft (GI soft diet) and consult with GI for further recommendations. SLP Visit Diagnosis: Dysphagia, pharyngoesophageal phase (R13.14)    Aspiration Risk  Mild aspiration risk    Diet Recommendation Regular;Thin liquid    Liquid Administration via: Cup;Straw Medication Administration: Other (Comment) (as  tolerated) Supervision: Patient able to self feed Compensations: Slow rate;Small sips/bites Postural Changes: Remain upright for at least 30 minutes after po intake;Seated upright at 90 degrees    Other  Recommendations Recommended Consults: Consider GI evaluation;Consider esophageal assessment Oral Care Recommendations: Oral care BID;Patient independent with oral care    Recommendations for follow up therapy are one component of a multi-disciplinary discharge planning process, led by the attending physician.  Recommendations may be updated based on patient status, additional functional criteria and insurance authorization.  Follow up Recommendations No SLP follow up      Assistance Recommended at Discharge    Functional Status Assessment Patient has not had a recent decline in their functional status  Frequency and Duration   N/A         Prognosis        Swallow Study   General Date of Onset: 04/10/23 HPI: Patient is a 72 y.o. female with PMH: IDDM, CAD s/p CABG, chronic HFpEF, a-fib on Warfarin, OSA not on CPAP and CKD 3B. She presented to the hospital on 04/10/23 with epigastric pain, nausea and vomiting. She has h/o hiatal hernia, mild stricture at GE junction, severe focal stricture in proximal esophagus at C5-6 level. She has had EGD with dilation three times with most recent being 2019. She had already made an OP GI appointment for 04/14/23 due to her reflux getting worse. Type of Study: Bedside Swallow Evaluation Previous Swallow Assessment: all GI related Diet Prior to this Study: Thin liquids (Level 0);Full liquid diet Temperature Spikes Noted: No Respiratory Status: Room air History of Recent Intubation: No Behavior/Cognition: Alert;Cooperative;Pleasant mood Oral Cavity Assessment: Within Functional Limits Oral Care Completed by SLP: No Oral Cavity - Dentition: Dentures, top;Dentures, bottom Vision: Functional for self-feeding Self-Feeding Abilities: Able to feed  self Patient Positioning: Upright in bed Baseline Vocal Quality: Normal Volitional Cough: Strong Volitional Swallow: Able to elicit    Oral/Motor/Sensory Function Overall Oral Motor/Sensory Function: Within functional limits   Ice Chips     Thin Liquid Thin Liquid: Within functional limits Presentation: Straw;Self Fed    Nectar Thick     Honey Thick     Puree Puree: Not tested   Solid     Solid: Not tested      Angela Nevin, MA, CCC-SLP Speech Therapy

## 2023-04-11 NOTE — TOC Initial Note (Signed)
Transition of Care [facility]) - Initial/Assessment Note    Patient Details  Name: [name] MRN: [id number] Date of Birth: 1951/04/27  Transition of Care [facility] CM/SW Contact:    Ralene Bathe, LCSW Phone Number: 04/11/2023, 12:53 PM  Clinical Narrative:                 CSW received consult for possible SNF placement at time of discharge. CSW spoke with patient. Patient expressed understanding of PT recommendation and prefers to go home with home health at discharge.  The patient reports that she was at [facility]) in January of this year and discharged on 10/28/2022.  Patient reports that she left because she had entered copay days and could not afford to stay.  The patient has not had the 60 days of wellness to restart SNF days and is still in copay days.  The patient reports that she had home health services previously through [facility].  The patient reports that this would be her agency of choice.  The patient reports having the following DME: hospital bed, wheelchair, walker, and bedside commode.  The patient's son is at the home with her and will assist.    TOC following.     Expected Discharge Plan: Home w Home Health Services Barriers to Discharge: Continued Medical Work up   Patient Goals and CMS Choice Patient states their goals for this hospitalization and ongoing recovery are:: To return home and get stonger CMS Medicare.gov Compare Post Acute Care list provided to:: Patient Choice offered to / list presented to : Patient      Expected Discharge Plan and Services In-house Referral: Clinical Social Work     Living arrangements for the past 2 months: Single Family Home                                      Prior Living Arrangements/Services Living arrangements for the past 2 months: Single Family Home Lives with:: Adult Children Patient language and need for interpreter reviewed:: Yes Do you feel safe going back to the place where you live?: Yes       Need for Family Participation in Patient Care: Yes (Comment) Care giver support system in place?: Yes (comment) Current home services: DME Criminal Activity/Legal Involvement Pertinent to Current Situation/Hospitalization: No - Comment as needed  Activities of Daily Living Home Assistive Devices/Equipment: Bedside commode/3-in-1, CBG Meter, Dentures (specify type), Eyeglasses, Walker (specify type), Wheelchair ADL Screening (condition at time of admission) Patient's cognitive ability adequate to safely complete daily activities?: Yes Is the patient deaf or have difficulty hearing?: No Does the patient have difficulty seeing, even when wearing glasses/contacts?: No Does the patient have difficulty concentrating, remembering, or making decisions?: No Patient able to express need for assistance with ADLs?: Yes Does the patient have difficulty dressing or bathing?: Yes Independently performs ADLs?: No Communication: Independent Dressing (OT): Needs assistance Is this a change from baseline?: Pre-admission baseline Grooming: Independent Feeding: Independent Bathing: Needs assistance Is this a change from baseline?: Pre-admission baseline Toileting: Needs assistance Is this a change from baseline?: Pre-admission baseline In/Out Bed: Needs assistance Is this a change from baseline?: Pre-admission baseline Walks in Home: Needs assistance Is this a change from baseline?: Pre-admission baseline Does the patient have difficulty walking or climbing stairs?: Yes Weakness of Legs: Both Weakness of Arms/Hands: Both  Permission Sought/Granted   Permission granted to share information with : Yes,  Verbal Permission Granted     Permission granted to share info w AGENCY: home health agencyu        Emotional Assessment Appearance:: Appears stated age Attitude/Demeanor/Rapport: Engaged Affect (typically observed): Adaptable, Pleasant Orientation: : Oriented to Situation, Oriented to  Time,  Oriented to Place, Oriented to Self Alcohol / Substance Use: Not Applicable Psych Involvement: No (comment)  Admission diagnosis:  Dyspepsia [R10.13] AKI (acute kidney injury) (HCC) [N17.9] Acute renal failure superimposed on stage 3b chronic kidney disease (HCC) [N17.9, N18.32] Patient Active Problem List   Diagnosis Date Noted   Acute renal failure superimposed on stage 3b chronic kidney disease (HCC) 04/10/2023   Prolonged QT interval 04/10/2023   Arthritis of right knee 10/06/2022   Class 3 obesity (HCC) 10/06/2022   Chronic diastolic CHF (congestive heart failure) (HCC) 10/01/2022   Atrial flutter (HCC) 07/14/2021   Chest pain with moderate risk for cardiac etiology 07/14/2021   COPD (chronic obstructive pulmonary disease) (HCC) 12/05/2020   Hypercholesterolemia 01/11/2018   Diabetes mellitus type 2 in obese 01/11/2018   Pulmonary nodules/lesions, multiple 11/23/2017   RBBB 10/31/2017   S/P CABG x 1 08/17/2017   Abnormal nuclear stress test 02/05/2016   Chest pain with high risk for cardiac etiology 02/05/2016   CAD S/P percutaneous coronary angioplasty 02/05/2016   GERD (gastroesophageal reflux disease) 01/06/2016   CKD (chronic kidney disease) stage 3, GFR 30-59 ml/min (HCC) 01/06/2016   Chronic diastolic heart failure (HCC) 06/28/2015   Esophageal reflux 02/15/2014   Anemia 01/17/2014   S/P left TK revision 12/31/2013   Knee osteomyelitis, left (HCC) 11/21/2013   Foreign body of knee with infection 11/18/2013   Unspecified constipation 11/07/2013   Expected blood loss anemia 11/06/2013   Left knee spacer, following removal of joint prosthesis 11/05/2013   DM (diabetes mellitus), type 2 with peripheral vascular complications (HCC) 11/05/2013   Onychomycosis 10/23/2013   Pain, foot 10/23/2013   DJD (degenerative joint disease) of knee 04/10/2013   Vaginal atrophy 01/01/2013   OAB (overactive bladder) 01/01/2013   Osteopenia 01/01/2013   Chest pain 11/28/2012    Palpitations 11/22/2012   Type 2 diabetes mellitus with hyperlipidemia (HCC) 11/22/2012   Chronic anticoagulation 11/22/2012   Super obese 07/11/2012   Trimalleolar fracture 07/07/2012   Coronary artery disease involving native coronary artery of native heart without angina pectoris 04/08/2012   ICM, EF 40-45% cath 04/07/12- improved to 50-60% by echo Oct 2013 04/08/2012   Paroxysmal atrial fibrillation (HCC) 04/07/2012   NSTEMI (non-ST elevated myocardial infarction)- 7/13 04/07/2012   Unstable angina (HCC) 04/07/2012   PVD (peripheral vascular disease), Lt SFA PTA Aug 2012 04/07/2012   Cancer of left lung parenchyma (HCC) 01/13/2009   Insulin dependent diabetes mellitus (HCC) 11/27/2008   Essential hypertension 11/27/2008   PCP:  Georgianne Fick, MD Pharmacy:   [facility] [phone] Gin[place] Otto, [place] - [address] AT [facility] [address] [place] [address] Phone: [phone] Fax: [phone]  [facility] [place], [place] - [address] [place] [address] [place] [place] [address] Phone: [phone] Fax: [phone]     Social Determinants of Health (SDOH) Social History: SDOH Screenings   Food Insecurity: No Food Insecurity (04/10/2023)  Housing: Low Risk  (04/10/2023)  Transportation Needs: No Transportation Needs (04/10/2023)  Utilities: Not At Risk (04/10/2023)  Depression (PHQ2-9): Low Risk  (05/07/2020)  Stress: Stress Concern Present (11/14/2019)  Tobacco Use: Medium Risk (04/10/2023)   SDOH Interventions:  Readmission Risk Interventions     No data to display

## 2023-04-11 NOTE — Evaluation (Signed)
Occupational Therapy Evaluation Patient Details Name: [name] MRN: [id number] DOB: 09/03/1951 Today's Date: 04/11/2023   History of Present Illness Pt is a 72 y/o female who presents 7/13 to ED with c/o familiar sternal chest pain ongoing for ~ 5 days, N/V, and epigastric pain. PMH significant for insulin-dependent diabetes mellitus, CAD status post CABG, chronic HFpEF, atrial fibrillation on warfarin, OSA not on CPAP, and CKD 3B. CT abdomen/pelvis imaging 7/13 unremarkable.   Clinical Impression   Pt evaluated s/p above admission list. Pt reports receiving assistance from son for ADL/IADLs, transportation and occasional assist for stand pivot transfers to/from w/c using RW at baseline.  Pt presents this session with generalized weakness and decreased balance. Pt currently requires setup A for seated UB ADLs and max A for LB ADLs, pt utilizes AE for LB dressing at baseline. Pt completed STS transfer from recliner using RW with mod A +2 on first trial progressing to min guard A +2 on second trial with repositioning. Pt completed room level mobility of approx 8' using RW with min guard A + chair follow assist. Pt would benefit from continued acute OT services to maximize functional independence and facilitate transition to pt's natural home environment with [facility] OT services upon discharge.     Recommendations for follow up therapy are one component of a multi-disciplinary discharge planning process, led by the attending physician.  Recommendations may be updated based on patient status, additional functional criteria and insurance authorization.   Assistance Recommended at Discharge Frequent or constant Supervision/Assistance  Patient can return home with the following A little help with walking and/or transfers;A lot of help with bathing/dressing/bathroom;Assistance with cooking/housework;Direct supervision/assist for financial management;Direct supervision/assist for medications  management;Assist for transportation;Help with stairs or ramp for entrance    Functional Status Assessment  Patient has had a recent decline in their functional status and demonstrates the ability to make significant improvements in function in a reasonable and predictable amount of time.  Equipment Recommendations  None recommended by OT    Recommendations for Other Services       Precautions / Restrictions Precautions Precautions: Fall Restrictions Weight Bearing Restrictions: No      Mobility Bed Mobility               General bed mobility comments: Not assessed, pt in chair upon arrival    Transfers Overall transfer level: Needs assistance Equipment used: Rolling walker (2 wheels) Transfers: Sit to/from Stand Sit to Stand: Mod assist, Min guard, +2 physical assistance, +2 safety/equipment           General transfer comment: STS transfer from recliner using RW with mod A +2 on first attempt with pt unable to achieve full stand. Pt able to achieve full stand on second attempt with RW and min guard A +2. Pt room level mobility approx 8' using RW with min guard A + chair follow.      Balance Overall balance assessment: Needs assistance Sitting-balance support: Feet supported, No upper extremity supported Sitting balance-Leahy Scale: Fair Sitting balance - Comments: unsupported sitting in chair   Standing balance support: Bilateral upper extremity supported, During functional activity, Reliant on assistive device for balance Standing balance-Leahy Scale: Poor Standing balance comment: BUE support on RW for stability                           ADL either performed or assessed with clinical judgement   ADL Overall ADL's : Needs assistance/impaired  Eating/Feeding: Modified independent;Sitting   Grooming: Set up;Sitting   Upper Body Bathing: Set up;Sitting   Lower Body Bathing: Maximal assistance;Sit to/from stand   Upper Body Dressing : Set  up;Sitting Upper Body Dressing Details (indicate cue type and reason): donned hospital gown at chair level with setup A Lower Body Dressing: Maximal assistance;Sit to/from stand Lower Body Dressing Details (indicate cue type and reason): donned bilat socks at chair level with max A, uses AE for LB dressing at baseline Toilet Transfer: Min guard;+2 for physical assistance;+2 for safety/equipment;Ambulation;Comfort height toilet;Rolling walker (2 wheels) Toilet Transfer Details (indicate cue type and reason): simulated Toileting- Clothing Manipulation and Hygiene: Maximal assistance;Sit to/from stand       Functional mobility during ADLs: Min guard;+2 for physical assistance;+2 for safety/equipment;Rolling walker (2 wheels) General ADL Comments: limited secondary to generalized weakness, decreased balance     Vision Baseline Vision/History: 0 No visual deficits Ability to See in Adequate Light: 0 Adequate Vision Assessment?: No apparent visual deficits     Perception Perception Perception Tested?: No   Praxis Praxis Praxis tested?: Not tested    Pertinent Vitals/Pain Pain Assessment Pain Assessment: No/denies pain Pain Intervention(s): Monitored during session     Hand Dominance Right   Extremity/Trunk Assessment Upper Extremity Assessment Upper Extremity Assessment: Generalized weakness   Lower Extremity Assessment Lower Extremity Assessment: Defer to PT evaluation   Cervical / Trunk Assessment Cervical / Trunk Assessment: Kyphotic   Communication Communication Communication: HOH   Cognition Arousal/Alertness: Awake/alert Behavior During Therapy: WFL for tasks assessed/performed Overall Cognitive Status: No family/caregiver present to determine baseline cognitive functioning                                 General Comments: Pleasant and cooperative, some inconsistancies given when reporting assist level during transfers at home     General Comments   VSS on RA    Exercises     Shoulder Instructions      Home Living Family/patient expects to be discharged to:: Private residence Living Arrangements: Children Available Help at Discharge: Family;Available PRN/intermittently Type of Home: House Home Access: Stairs to enter Entrance Stairs-Number of Steps: 1 Entrance Stairs-Rails: None Home Layout: One level     Bathroom Shower/Tub: Sponge bathes at baseline;Tub/shower unit   Bathroom Toilet: Handicapped height Bathroom Accessibility: Yes How Accessible: Accessible via walker Home Equipment: Rolling Walker (2 wheels);Rollator (4 wheels);BSC/3in1;Shower seat;Wheelchair - Building surveyor;Hospital bed Adaptive Equipment: Reacher;Sock aid;Long-handled shoe horn;Other (Comment) Additional Comments: Son works 3rd shift and available during the day      Prior Functioning/Environment Prior Level of Function : Needs assist             Mobility Comments: Pt completes stand pivot transfers from hospital bed<>w/c, able to self-propel in w/c. Walks short distance to bathroom using RW as w/c does not fit. Son present and assists as needed during transfers. ADLs Comments: Pt receives assistance from son for LB sponge bathing, IADLs, and transportation. Wears a brief at night for incontinence.        OT Problem List: Decreased strength;Decreased activity tolerance;Impaired balance (sitting and/or standing)      OT Treatment/Interventions: Self-care/ADL training;Therapeutic exercise;Energy conservation;DME and/or AE instruction;Therapeutic activities;Patient/family education;Balance training    OT Goals(Current goals can be found in the care plan section) Acute Rehab OT Goals Patient Stated Goal: to go home OT Goal Formulation: With patient Time For Goal Achievement: 04/25/23 Potential to Achieve Goals: Good  ADL Goals Pt Will Perform Lower Body Bathing: with min assist;sit to/from stand;with adaptive equipment Pt Will  Perform Lower Body Dressing: with modified independence;sit to/from stand;with adaptive equipment Pt Will Transfer to Toilet: with supervision;ambulating;regular height toilet Pt Will Perform Toileting - Clothing Manipulation and hygiene: with supervision;sit to/from stand  OT Frequency: Min 2X/week    Co-evaluation              AM-PAC OT "6 Clicks" Daily Activity     Outcome Measure Help from another person eating meals?: None Help from another person taking care of personal grooming?: A Little Help from another person toileting, which includes using toliet, bedpan, or urinal?: A Little Help from another person bathing (including washing, rinsing, drying)?: A Lot Help from another person to put on and taking off regular upper body clothing?: A Little Help from another person to put on and taking off regular lower body clothing?: A Lot 6 Click Score: 17   End of Session Equipment Utilized During Treatment: Gait belt;Rolling walker (2 wheels) Nurse Communication: Mobility status  Activity Tolerance: Patient tolerated treatment well Patient left: in chair;with call bell/phone within reach  OT Visit Diagnosis: Unsteadiness on feet (R26.81);Muscle weakness (generalized) (M62.81)                Time: 1039-1050 OT Time Calculation (min): 11 min Charges:     Sherley Bounds, OTS Acute Rehabilitation Services Office [phone] Secure Chat Communication Preferred   Sherley Bounds 04/11/2023, 11:29 AM

## 2023-04-12 ENCOUNTER — Other Ambulatory Visit: Payer: Self-pay | Admitting: Cardiovascular Disease

## 2023-04-12 DIAGNOSIS — N179 Acute kidney failure, unspecified: Secondary | ICD-10-CM | POA: Diagnosis not present

## 2023-04-12 DIAGNOSIS — R112 Nausea with vomiting, unspecified: Secondary | ICD-10-CM

## 2023-04-12 DIAGNOSIS — E1169 Type 2 diabetes mellitus with other specified complication: Secondary | ICD-10-CM | POA: Diagnosis not present

## 2023-04-12 DIAGNOSIS — I480 Paroxysmal atrial fibrillation: Secondary | ICD-10-CM

## 2023-04-12 DIAGNOSIS — I5032 Chronic diastolic (congestive) heart failure: Secondary | ICD-10-CM | POA: Diagnosis not present

## 2023-04-12 DIAGNOSIS — I2510 Atherosclerotic heart disease of native coronary artery without angina pectoris: Secondary | ICD-10-CM | POA: Diagnosis not present

## 2023-04-12 LAB — CBC WITH DIFFERENTIAL/PLATELET
Abs Immature Granulocytes: 0.03 10*3/uL (ref 0.00–0.07)
Basophils Absolute: 0.1 10*3/uL (ref 0.0–0.1)
Basophils Relative: 1 %
Eosinophils Absolute: 0.2 10*3/uL (ref 0.0–0.5)
Eosinophils Relative: 3 %
HCT: 32.1 % — ABNORMAL LOW (ref 36.0–46.0)
Hemoglobin: 10.7 g/dL — ABNORMAL LOW (ref 12.0–15.0)
Immature Granulocytes: 0 %
Lymphocytes Relative: 21 %
Lymphs Abs: 1.6 10*3/uL (ref 0.7–4.0)
MCH: 29.9 pg (ref 26.0–34.0)
MCHC: 33.3 g/dL (ref 30.0–36.0)
MCV: 89.7 fL (ref 80.0–100.0)
Monocytes Absolute: 0.6 10*3/uL (ref 0.1–1.0)
Monocytes Relative: 8 %
Neutro Abs: 5.2 10*3/uL (ref 1.7–7.7)
Neutrophils Relative %: 67 %
Platelets: 200 10*3/uL (ref 150–400)
RBC: 3.58 MIL/uL — ABNORMAL LOW (ref 3.87–5.11)
RDW: 12.5 % (ref 11.5–15.5)
WBC: 7.8 10*3/uL (ref 4.0–10.5)
nRBC: 0 % (ref 0.0–0.2)

## 2023-04-12 LAB — COMPREHENSIVE METABOLIC PANEL
ALT: 16 U/L (ref 0–44)
AST: 18 U/L (ref 15–41)
Albumin: 2.9 g/dL — ABNORMAL LOW (ref 3.5–5.0)
Alkaline Phosphatase: 77 U/L (ref 38–126)
Anion gap: 6 (ref 5–15)
BUN: 17 mg/dL (ref 8–23)
CO2: 26 mmol/L (ref 22–32)
Calcium: 9.1 mg/dL (ref 8.9–10.3)
Chloride: 102 mmol/L (ref 98–111)
Creatinine, Ser: 1.51 mg/dL — ABNORMAL HIGH (ref 0.44–1.00)
GFR, Estimated: 36 mL/min — ABNORMAL LOW (ref >60)
Glucose, Bld: 145 mg/dL — ABNORMAL HIGH (ref 70–99)
Potassium: 3.8 mmol/L (ref 3.5–5.1)
Sodium: 134 mmol/L — ABNORMAL LOW (ref 135–145)
Total Bilirubin: 0.5 mg/dL (ref 0.3–1.2)
Total Protein: 6.8 g/dL (ref 6.5–8.1)

## 2023-04-12 LAB — PROTIME-INR
INR: 3 — ABNORMAL HIGH (ref 0.8–1.2)
Prothrombin Time: 31.5 seconds — ABNORMAL HIGH (ref 11.4–15.2)

## 2023-04-12 LAB — MAGNESIUM: Magnesium: 2 mg/dL (ref 1.7–2.4)

## 2023-04-12 LAB — PHOSPHORUS: Phosphorus: 2.6 mg/dL (ref 2.5–4.6)

## 2023-04-12 LAB — GLUCOSE, CAPILLARY
Glucose-Capillary: 116 mg/dL — ABNORMAL HIGH (ref 70–99)
Glucose-Capillary: 173 mg/dL — ABNORMAL HIGH (ref 70–99)

## 2023-04-12 MED ORDER — ACETAMINOPHEN 325 MG PO TABS: 650.0000 mg | ORAL_TABLET | Freq: Four times a day (QID) | ORAL | 0 refills | Status: AC | PRN

## 2023-04-12 MED ORDER — LISINOPRIL 10 MG PO TABS: 10.0000 mg | ORAL_TABLET | Freq: Every day | ORAL | 0 refills | Status: DC

## 2023-04-12 MED ORDER — DICLOFENAC SODIUM 1 % EX GEL
2.0000 g | Freq: Four times a day (QID) | CUTANEOUS | Status: DC
  Filled 2023-04-12: qty 100

## 2023-04-12 NOTE — TOC Transition Note (Signed)
Transition of Care [facility]) - CM/SW Discharge Note   Patient Details  Name: [name] MRN: [id number] Date of Birth: 07-07-1951  [facility] of [facility]) CM/SW Contact:  Tom-Johnson, Hershal Coria, RN Phone Number: 04/12/2023, 3:42 PM   Clinical Narrative:     Patient is scheduled for discharge today.  Readmission Risk Assessment done. Home health info, Outpatient f/u, hospital f/u and discharge instructions on AVS. Son, Adela Glimpse to transport at discharge.  No further TOC needs noted.      Final next level of care: Home w Home Health Services Barriers to Discharge: Barriers Resolved   Patient Goals and CMS Choice CMS Medicare.gov Compare Post Acute Care list provided to:: Patient Choice offered to / list presented to : Patient  Discharge Placement                  Patient to be transferred to facility by: Son Name of family member notified: Adela Glimpse    Discharge Plan and Services Additional resources added to the After Visit Summary for   In-house Referral: Clinical Social Work                        HH Arranged: PT, OT, RN, Nurse's Aide HH Agency: [facility] Date HH Agency Contacted: 04/12/23 Time HH Agency Contacted: 1330 Representative spoke with at [facility] Agency: Haywood Lasso  Social Determinants of Health (SDOH) Interventions SDOH Screenings   Food Insecurity: No Food Insecurity (04/10/2023)  Housing: Low Risk  (04/10/2023)  Transportation Needs: No Transportation Needs (04/10/2023)  Utilities: Not At Risk (04/10/2023)  Depression (PHQ2-9): Low Risk  (05/07/2020)  Stress: Stress Concern Present (11/14/2019)  Tobacco Use: Medium Risk (04/10/2023)     Readmission Risk Interventions    04/12/2023    3:40 PM  Readmission Risk Prevention Plan  Transportation Screening Complete  PCP or Specialist Appt within 3-5 Days Complete  HRI or Home Care Consult Complete  Social Work Consult for Recovery Care Planning/Counseling Complete  Palliative Care  Screening Not Applicable  Medication Review Oceanographer) Referral to Pharmacy

## 2023-04-12 NOTE — TOC Progression Note (Signed)
Transition of Care [facility]) - Progression Note    Patient Details  Name: [name] MRN: [id number] Date of Birth: [birth year]  Transition [facility]) CM/SW Contact  Tom-Johnson, Hershal Coria, RN Phone Number: 04/12/2023, 1:34 PM  Clinical Narrative:     CM spoke with patient at bedside about Home Health request. Patient states her son will be home assisting with her care. Patient requests [facility]. CM called in referral to [facility] and [facility] voiced acceptance, info on AVS.   CM will continue to follow as patient progresses with care towards discharge.         Expected Discharge Plan: Home w Home Health Services Barriers to Discharge: Continued Medical Work up  Expected Discharge Plan and Services In-house Referral: Clinical Social Work     Living arrangements for the past 2 months: Single Family Home                                       Social Determinants of Health (SDOH) Interventions SDOH Screenings   Food Insecurity: No Food Insecurity (04/10/2023)  Housing: Low Risk  (04/10/2023)  Transportation Needs: No Transportation Needs (04/10/2023)  Utilities: Not At Risk (04/10/2023)  Depression (PHQ2-9): Low Risk  (05/07/2020)  Stress: Stress Concern Present (11/14/2019)  Tobacco Use: Medium Risk (04/10/2023)    Readmission Risk Interventions     No data to display

## 2023-04-12 NOTE — Discharge Summary (Signed)
Physician Discharge Summary   Patient: [name] MRN: [id number] DOB: October 07, 1950  Admit date:     04/09/2023  Discharge date: 04/12/23  Discharge Physician: Marguerita Merles, DO   PCP: Georgianne Fick, MD   Recommendations at discharge:   Follow-up with PCP within 1 to 2 weeks and repeat CBC, CMP, mag, Phos within 1 week Follow-up with gastroenterology in outpatient setting and go to your regular scheduled appointment on 04/14/2023 with Dr. [name] Follow-up with the Coumadin clinic for continued monitoring of INR and adjustments  Discharge Diagnoses: Principal Problem:   Acute renal failure superimposed on stage 3b chronic kidney disease (HCC) Active Problems:   Coronary artery disease involving native coronary artery of native heart without angina pectoris   Chronic diastolic CHF (congestive heart failure) (HCC)   Type 2 diabetes mellitus with hyperlipidemia (HCC)   Essential hypertension   Paroxysmal atrial fibrillation (HCC)   Prolonged QT interval  Resolved Problems:   * No resolved hospital problems. [facility] Course: The patient is a pleasant 72 year old African-American female with past medical history significant for but not limited to insulin-dependent diabetes mellitus type 2, CAD status post CABG chronic heart failure with preserved ejection fraction and diastolic dysfunction as well as history of atrial fibrillation on anticoagulation with warfarin, OSA not on CPAP as well as history of CKD stage IIIb and other comorbidities who presented with epigastric pain, nausea and vomiting.  She reports a long history of acid reflux and takes Protonix daily and uses over-the-counter antiacid as needed.  She reports that she has a history of gastroparesis attributed to her diabetes and has been previously taking Reglan regularly but ran out.  Had an EGD done in 2019 with a benign-appearing stenosis that was dilated as well as a hiatal hernia and a normal-appearing stomach and  duodenum.  She continued to have epigastric pain, nausea vomiting which she experienced in the past with as having it last longer and has an appointment to see her GI doctor Dr. [name] on 04/13/2013.  She came to the ED and she was afebrile and was found to have an AKI and no other acute findings on chest x-ray or CT scan of the abdomen pelvis.  She was given a 1 L bolus of LR, Pepcid, GI cocktail and Reglan and admitted for further evaluation.  She was placed on a clear liquid diet and will advance diet slowly and continue to hydrate the patient with normal saline at 100 MLS per hour for 15 hours total and this expired but we will renew at 75 MLS per hour.  She had issues with trouble swallowing some of her pills so SLP evaluated and felt that she had esophageal dysphagia and recommended GI consultation.  GI was consulted and I spoke with Dr. Ledell Noss spoke with the patient and she will follow the patient in outpatient setting.  Given that her renal function continues to prove and she tolerated a soft diet without issues she was felt that she is from a medical standpoint stable for discharge.  PT OT recommending home health.  At this time she is stable to discharge and will follow-up with her PCP and gastroenterologist in the outpatient setting.  Assessment and Plan:  AKI on CKD Stage 3b -BUN/Cr Trend: Recent Labs  Lab 04/09/23 2120 04/10/23 0235 04/11/23 0141 04/12/23 0101  BUN 33* 31* 23 17  CREATININE 2.58* 2.44* 1.69* 1.51*  -Continued NS at 100 mL/hr for 15 hours and expired however will renew at  75 MLS per hour with normal saline hydrate through the night -Avoid Nephrotoxic Medications, Contrast Dyes, Hypotension and Dehydration to Ensure Adequate Renal Perfusion and will need to Renally Adjust Meds and did so for discharge and discontinued her diuretic -Continue to Monitor and Trend Renal Function carefully and repeat CMP in 1 week  Hyponatremia -Na+ Trend:  Recent Labs  Lab 04/09/23 2120  04/10/23 0235 04/11/23 0141 04/12/23 0101  NA 135 132* 133* 134*  -NS at 100 mL/hr has finished but will renew at 75 MLS per hour and hydrate her overnight -Continue to Monitor and Trend and repeat CMP within 1 week  Abdominal pain, N/V and Diarrhea -No acute findings on CT and showed "No acute abnormality in the abdomen or pelvis. Aortic Atherosclerosis"; exam benign  -She reports hx of GERD and gastroparesis and is scheduled to see GI on 04/14/23  -Check LFTs and lipase; lipase was slightly elevated at 63 and LFTs were normalized -Allow clears and advance as tolerated and will go to soft today -Continue symptomatic treatments with IVF and Antiemetics -Checking C Difficile per Protocol and was negative so we will discontinue enteric precautions -Patient is following up with her GI physician on Thursday  Dysphagia, improved -Patient is having trouble swallowing her pills even with applesauce -Check SLP and the feel her swallowing issues are related to her GERD/esophageal dysphagia and are not concerned with aspiration or any pharyngeal phase impairments.  -Speech therapy is recommended a GI consultation or an esophagram and trying her on a soft diet. -Work with GI who went to evaluate the patient but the patient is no longer nauseous and spoke to the patient at length and recommending that the patient be followed up in the outpatient clinic at her regular scheduled appointment on 04/14/2023.   CAD status post CABG -No anginal complaints   -Continue beta-blocker and statin; resume home cardiac meds but hold hydrochlorothiazide for discharge   PAF  -Continue Warfarin with Pharmacy to Dose -C/w Metoprolol Succinate 50 mg po Daily   Chronic HFpEF  -Holding diuretics as above and discontinued hydrochlorothiazide -Getting Gentle IVF Hydration with 75 MLS per hour now -Strict I's and O's and Daily Weights;  Intake/Output Summary (Last 24 hours) at 04/12/2023 2051 Last data filed at  04/12/2023 1300 Gross per 24 hour  Intake 480 ml  Output 1950 ml  Net -1470 ml  -IV fluid hydration has now stopped and patient is renal function is improved   Hypertension  -Hold lisinopril-hydrochlorothiazide as above  -Continue to Monitor and BP per Protocol  -Last BP reading was 123/45  Uncontrolled Insulin Dependent Diabetes Mellitus Type 2 -HbA1c was 8.6 this visit and was 8.8 in January 2024 -CBG and Glucose Trend: Recent Labs  Lab 04/09/23 2120 04/10/23 0228 04/10/23 0235 04/10/23 0729 04/11/23 0141 04/11/23 0512 04/12/23 0101 04/12/23 0714 04/12/23 1128  GLUCOSE 262*  --  218*  --  103*  --  145*  --   --   GLUCAP  --    < >  --    < >  --    < >  --    < > 173*   < > = values in this interval not displayed.  -C/w Semglee 10 units sq Daily and Very Sensitive Novolog Insulin 0-6 units  Normocytic Anemia -Hgb/Hct Trend: Recent Labs  Lab 04/09/23 2120 04/10/23 0235 04/11/23 0141 04/12/23 0101  HGB 11.7* 11.0* 10.5* 10.7*  HCT 35.5* 33.8* 31.7* 32.1*  MCV 89.4 88.0 89.5 89.7  -  Checked Anemia Panel showed an iron level of 52, UIBC of 232, TIBC of 284, saturation ratios of 18%, ferritin level of 133, follow-up with 12.8, vitamin B12 level 465 -Continue to Monitor for S/Sx of Bleeding; No overt bleeding noted -Repeat CBC within 1 week  GERD/GI Prophylaxis and Gastroparesis -C/w Pantoprazole 40 mg po BID -Given Metaclopramide 20 mg IV x1 -May discuss with her Primary GI Physician and patient is to follow-up with them in outpatient setting  Hypoalbuminemia -Patient's Albumin Trend: Recent Labs  Lab 04/10/23 0235 04/11/23 0141 04/12/23 0101  ALBUMIN 3.3* 2.9* 2.9*  -Continue to Monitor and Trend and repeat CMP in the AM  Morbid Obesity -Complicates overall prognosis and care -Estimated body mass index is 43.27 kg/m as calculated from the following:   Height as of this encounter: 5\' 3"  (1.6 m).   Weight as of this encounter: 110.8 kg.  -Weight Loss and  Dietary Counseling given  Consultants: Discussed the case with GI Procedures performed: As delineated above Disposition: Home health Diet recommendation:  Discharge Diet Orders (From admission, onward)     Start     Ordered   04/12/23 0000  Diet - low sodium heart healthy        04/12/23 1448   04/12/23 0000  Diet Carb Modified        04/12/23 1448           Cardiac and Carb modified diet DISCHARGE MEDICATION: Allergies as of 04/12/2023       Reactions   Levofloxacin Itching   Morphine And Codeine Itching, Other (See Comments)   Pt reports oxycodone history with no allergy.        Medication List     STOP taking these medications    lisinopril-hydrochlorothiazide 20-25 MG tablet Commonly known as: ZESTORETIC       TAKE these medications    acetaminophen 325 MG tablet Commonly known as: TYLENOL Take 2 tablets (650 mg total) by mouth every 6 (six) hours as needed for mild pain (or Fever >/= 101).   diclofenac Sodium 1 % Gel Commonly known as: VOLTAREN Apply 2 g topically 4 (four) times daily. Right knee   furosemide 40 MG tablet Commonly known as: LASIX Take 1 tablet (40 mg total) by mouth 3 (three) times a week. TAKE Monday Wednesday AND FRIDAY What changed:  when to take this additional instructions   insulin NPH-regular Human (70-30) 100 UNIT/ML injection Inject 20 Units into the skin 2 (two) times daily with a meal.   lisinopril 10 MG tablet Commonly known as: ZESTRIL Take 1 tablet (10 mg total) by mouth daily.   metoprolol succinate 50 MG 24 hr tablet Commonly known as: TOPROL-XL TAKE 1 TABLET BY MOUTH DAILY. TAKE WITH OR IMMEDIATELY FOLLOWING A MEAL.   metoprolol tartrate 25 MG tablet Commonly known as: LOPRESSOR Take 1 table as needed for heart rate above 120bpm.   OneTouch Delica Plus Lancet33G Misc Apply topically.   OneTouch Verio Flex System w/Device Kit   OneTouch Verio test strip Generic drug: glucose blood 1 each 2 (two)  times daily.   oxyCODONE-acetaminophen 5-325 MG tablet Commonly known as: PERCOCET/ROXICET Take 1 tablet by mouth every 6 (six) hours as needed for severe pain.   Ozempic (0.25 or 0.5 MG/DOSE) 2 MG/1.5ML Sopn Generic drug: Semaglutide(0.25 or 0.5MG /DOS) Inject into the skin.   pantoprazole 40 MG tablet Commonly known as: PROTONIX Take 1 tablet (40 mg total) by mouth daily. What changed: when to take this  polyethylene glycol 17 g packet Commonly known as: MIRALAX / GLYCOLAX Take 17 g by mouth daily.   rosuvastatin 40 MG tablet Commonly known as: CRESTOR Take 40 mg by mouth daily.   spironolactone 25 MG tablet Commonly known as: ALDACTONE Take 1 tablet (25 mg total) by mouth 3 (three) times a week. TAKE Monday Wednesday AND Friday What changed:  when to take this additional instructions   Vitamin D3 50 MCG (2000 UT) Tabs Take 2,000 Units by mouth daily.   warfarin 7.5 MG tablet Commonly known as: COUMADIN Take as directed. If you are unsure how to take this medication, talk to your nurse or doctor. Original instructions: TAKE 1/2 TABLET TO 1 TABLET BY MOUTH DAILY AS DIRECTED BY COUMADIN CLINIC What changed: See the new instructions.         Follow-up Information     [facility] Follow up.   Specialty: Home Health Services Why: Someone will call you to schedule first home visit. If you have not received a call after two days of discharging home, call their number listed. If no one comes to assess, call Case Manager at [phone]. Contact information: [address] [place] [address] [phone]                Discharge Exam: Ceasar Mons Weights   04/09/23 2115 04/12/23 0500  Weight: 110.2 kg 110.8 kg   Vitals:   04/12/23 0515 04/12/23 0938  BP: (!) 136/45 (!) 134/53  Pulse: 78 77  Resp: 18 18  Temp: 98 F (36.7 C) 98 F (36.7 C)  SpO2: 100% 100%   Examination: Physical Exam:  Constitutional: WN/WD  morbidly obese American female in no acute distress Respiratory: Diminished to auscultation bilaterally, no wheezing, rales, rhonchi or crackles. Normal respiratory effort and patient is not tachypenic. No accessory muscle use.  Unlabored breathing Cardiovascular: RRR, no murmurs / rubs / gallops. S1 and S2 auscultated.  No appreciable extremity edema Abdomen: Soft, non-tender, distended secondary to body habitus. Bowel sounds positive.  GU: Deferred. Musculoskeletal: No clubbing / cyanosis of digits/nails. No joint deformity upper and lower extremities. Skin: No rashes, lesions, ulcers on limited skin evaluation. No induration; Warm and dry.  Neurologic: CN 2-12 grossly intact with no focal deficits. Romberg sign and cerebellar reflexes not assessed.  Psychiatric: Normal judgment and insight. Alert and oriented x 3. Normal mood and appropriate affect.   Condition at discharge: stable  The results of significant diagnostics from this hospitalization (including imaging, microbiology, ancillary and laboratory) are listed below for reference.   Imaging Studies: CT ABDOMEN PELVIS WO CONTRAST  Result Date: 04/09/2023 CLINICAL DATA:  Abdominal pain.  Bowel obstruction suspected. EXAM: CT ABDOMEN AND PELVIS WITHOUT CONTRAST TECHNIQUE: Multidetector CT imaging of the abdomen and pelvis was performed following the standard protocol without IV contrast. RADIATION DOSE REDUCTION: This exam was performed according to the departmental dose-optimization program which includes automated exposure control, adjustment of the mA and/or kV according to patient size and/or use of iterative reconstruction technique. COMPARISON:  CT abdomen and pelvis 10/08/2010 FINDINGS: Lower chest: No acute abnormality. Hepatobiliary: Cholecystectomy. Unremarkable liver and biliary tree. Pancreas: Unremarkable. Spleen: Unremarkable. Adrenals/Urinary Tract: Normal adrenal glands. No urinary calculi or hydronephrosis. Unremarkable  bladder. Stomach/Bowel: Normal caliber large and small bowel. Colonic diverticulosis without diverticulitis. Normal appendix. Stomach is within normal limits. Vascular/Lymphatic: Aortic atherosclerosis. No enlarged abdominal or pelvic lymph nodes. Reproductive: Status post hysterectomy. No adnexal masses. Other: No free intraperitoneal fluid  or air. Musculoskeletal: No acute osseous abnormality. IMPRESSION: 1. No acute abnormality in the abdomen or pelvis. Aortic Atherosclerosis (ICD10-I70.0). Electronically Signed   By: Minerva Fester M.D.   On: 04/09/2023 23:48   DG Chest 2 View  Result Date: 04/09/2023 CLINICAL DATA:  Chest pain EXAM: CHEST - 2 VIEW COMPARISON:  Radiographs 10/01/2022 FINDINGS: Stable cardiomediastinal silhouette. Sternotomy and CABG. Coronary stenting. Hazy opacity in the left lower lung is likely due to soft tissue overlap. The lungs are clear. No definite acute displaced rib fracture. IMPRESSION: No active cardiopulmonary disease. Electronically Signed   By: Minerva Fester M.D.   On: 04/09/2023 22:01    Microbiology: Results for orders placed or performed during the hospital encounter of 04/09/23  C Difficile Quick Screen w PCR reflex     Status: None   Collection Time: 04/10/23  8:28 PM   Specimen: STOOL  Result Value Ref Range Status   C Diff antigen NEGATIVE NEGATIVE Final   C Diff toxin NEGATIVE NEGATIVE Final   C Diff interpretation No C. difficile detected.  Final    Comment: Performed at [facility], [address], [place], [place] [address]   Labs: CBC: Recent Labs  Lab 04/09/23 2120 04/10/23 0235 04/11/23 0141 04/12/23 0101  WBC 9.6 10.2 7.7 7.8  NEUTROABS  --   --  5.5 5.2  HGB 11.7* 11.0* 10.5* 10.7*  HCT 35.5* 33.8* 31.7* 32.1*  MCV 89.4 88.0 89.5 89.7  PLT 244 214 201 200   Basic Metabolic Panel: Recent Labs  Lab 04/09/23 2120 04/10/23 0235 04/11/23 0141 04/12/23 0101  NA 135 132* 133* 134*  K 4.1 3.6 3.3* 3.8  CL 91* 97* 102 102   CO2 25 25 25 26   GLUCOSE 262* 218* 103* 145*  BUN 33* 31* 23 17  CREATININE 2.58* 2.44* 1.69* 1.51*  CALCIUM 10.4* 9.8 9.2 9.1  MG  --   --  2.1 2.0  PHOS  --   --  2.6 2.6   Liver Function Tests: Recent Labs  Lab 04/10/23 0235 04/11/23 0141 04/12/23 0101  AST 20 18 18   ALT 16 16 16   ALKPHOS 77 69 77  BILITOT 0.8 0.7 0.5  PROT 7.5 6.7 6.8  ALBUMIN 3.3* 2.9* 2.9*   CBG: Recent Labs  Lab 04/11/23 1106 04/11/23 1605 04/11/23 2056 04/12/23 0714 04/12/23 1128  GLUCAP 155* 218* 296* 116* 173*   Discharge time spent: greater than 30 minutes.  Signed: Marguerita Merles, DO [facility] 04/12/2023

## 2023-04-12 NOTE — Progress Notes (Signed)
DISCHARGE NOTE HOME [name] to be discharged Home per MD order. Diagnosis, treatment, medications, and follow up appointments discussed with the patient. Patient verbalized understanding.  Skin clean, dry and intact without evidence of skin break down, no evidence of skin tears noted. IV catheter discontinued intact. Pt denies pain at the site currently. No complaints noted. VSS.  An After Visit Summary (AVS) was printed and given to the patient.   Tresa Endo, RN

## 2023-04-12 NOTE — Progress Notes (Signed)
ANTICOAGULATION CONSULT NOTE - Follow Up Consult  Pharmacy Consult for warfarin Indication: atrial fibrillation  Allergies  Allergen Reactions   Levofloxacin Itching   Morphine And Codeine Itching and Other (See Comments)    Pt reports oxycodone history with no allergy.    Patient Measurements: Height: 5\' 3"  (160 cm) Weight: 110.8 kg (244 lb 4.3 oz) IBW/kg (Calculated) : 52.4  Vital Signs: Temp: 98 F (36.7 C) (07/16 0515) Temp Source: Oral (07/16 0515) BP: 136/45 (07/16 0515) Pulse Rate: 78 (07/16 0515)  Labs: Recent Labs    04/09/23 2120 04/10/23 0007 04/10/23 0235 04/11/23 0141 04/12/23 0101  HGB 11.7*  --  11.0* 10.5* 10.7*  HCT 35.5*  --  33.8* 31.7* 32.1*  PLT 244  --  214 201 200  LABPROT  --   --  27.6* 28.9* 31.5*  INR  --   --  2.5* 2.7* 3.0*  CREATININE 2.58*  --  2.44* 1.69* 1.51*  TROPONINIHS 10 8  --   --   --     Estimated Creatinine Clearance: 39.7 mL/min (A) (by C-G formula based on SCr of 1.51 mg/dL (H)).   Medications:  Scheduled:   insulin aspart  0-5 Units Subcutaneous QHS   insulin aspart  0-9 Units Subcutaneous TID WC   insulin glargine-yfgn  10 Units Subcutaneous Daily   metoprolol succinate  50 mg Oral Daily   pantoprazole  40 mg Oral BID   rosuvastatin  10 mg Oral Daily   Warfarin - Pharmacist Dosing Inpatient   Does not apply q1600    Assessment: 72 y.o. female admitted with abdominal pain, h/o atrial fibrillation, to continue warfarin. Patient reported home regimen as one tablet (7.5 mg) on Sun/Tues/Thurs and 1/2 tablet (3.75 mg) all other days. She reported her only recent missed dose was 7/13.    INR 3 - therapeutic  CBC: Hgb 10.7/Plt 200  While patient's INR is therapeutic at 3, she is on the higher end of her 2-3 goal, therefore, it is appropriate to hold today's (7/16) dose to avoid patient becoming supratherapeutic. CBC stable and no signs of bleeding or complications reported.    Goal of Therapy:  INR 2-3 Monitor  platelets by anticoagulation protocol: Yes   Plan:  Omit warfarin for today  Daily PT/INR. CBC intermittently. Monitor for signs/symptoms of bleeding.  Roslyn Smiling, PharmD Pharmacy Resident

## 2023-04-12 NOTE — Progress Notes (Signed)
Physical Therapy Re-Evaluation Patient Details Name: [name] MRN: [id number] DOB: [birth year] Today's Date: 04/12/2023  History of Present Illness  Pt is a 72 y/o female who presents 7/13 to ED with c/o familiar sternal chest pain ongoing for ~ 5 days, N/V, and epigastric pain. PMH significant for insulin-dependent diabetes mellitus, CAD status post CABG, chronic HFpEF, atrial fibrillation on warfarin, OSA not on CPAP, and CKD 3B. CT abdomen/pelvis imaging 7/13 unremarkable.   Clinical Impression  Prior to admission, patient reported needing assistance with functional mobility and ADLs. Pt reported use of w/c for household mobility. She is supported at home by her son who assists with transfers, w/c mobility and ADLs (cooking). She presents with decreased LE strength, endurance, activity tolerance and pain in her bilateral knees. Currently, patient demonstrated significant progress with transfers and ambulation. Progressed transfer from recliner from modA to minG within same session with cues for forward trunk lean for momentum and LE placement. Pt demonstrated improvement with ambulation distance. Pt walked 60 ft with RW; required min guard assist from PT and second PT for chair follow. Based on today's performance, PT recommends patient to return to home with continued skilled physical therapy in order to improve functional mobility, independence and maximize safety.       Assistance Recommended at Discharge Frequent or constant Supervision/Assistance  If plan is discharge home, recommend the following:  Can travel by private vehicle  A lot of help with walking and/or transfers;A lot of help with bathing/dressing/bathroom;Assistance with cooking/housework;Help with stairs or ramp for entrance;Assist for transportation   No    Equipment Recommendations None recommended by PT  Recommendations for Other Services       Functional Status Assessment       Precautions / Restrictions  Precautions Precautions: Fall Restrictions Weight Bearing Restrictions: No      Mobility  Bed Mobility               General bed mobility comments: Patient received sitting in recliner.    Transfers Overall transfer level: Needs assistance Equipment used: Rolling walker (2 wheels) Transfers: Sit to/from Stand Sit to Stand: Mod assist, Min guard, +2 physical assistance, +2 safety/equipment           General transfer comment: Patient required modA for powerup on initial sit to stand trial. Able to progress to minG for steady with verbal cues to rock forward and bring LE closer to chair.    Ambulation/Gait Ambulation/Gait assistance: Min guard Gait Distance (Feet): 60 Feet Assistive device: Rolling walker (2 wheels) Gait Pattern/deviations: Step-to pattern, Decreased step length - right, Decreased step length - left, Decreased stride length, Trunk flexed Gait velocity: Decreased Gait velocity interpretation: <1.31 ft/sec, indicative of household ambulator   General Gait Details: Presented with decreased velocity, trunk flexed and step to pattern. Patient able to ambulate 30 ft into hallway before requiring seated rest break. Able to ambulate another 30 ft before return to room on recliner.  Stairs            Wheelchair Mobility     Tilt Bed    Modified Rankin (Stroke Patients Only)       Balance Overall balance assessment: Needs assistance Sitting-balance support: Feet supported, No upper extremity supported Sitting balance-Leahy Scale: Fair Sitting balance - Comments: Sitting edge of recliner without UE support.   Standing balance support: Bilateral upper extremity supported, During functional activity, Reliant on assistive device for balance Standing balance-Leahy Scale: Poor Standing balance comment: Reliant on RW throughout  static and dynamic activities.                             Pertinent Vitals/Pain Pain Assessment Pain  Assessment: 0-10 Pain Score: 8  Pain Location: Bilateral knees L > R Pain Descriptors / Indicators: Discomfort, Grimacing Pain Intervention(s): Limited activity within patient's tolerance, Monitored during session    Home Living                          Prior Function                       Hand Dominance        Extremity/Trunk Assessment                Communication      Cognition Arousal/Alertness: Awake/alert Behavior During Therapy: WFL for tasks assessed/performed Overall Cognitive Status: No family/caregiver present to determine baseline cognitive functioning                                 General Comments: Pleasant and cooperative; followed one step commands consistently.        General Comments General comments (skin integrity, edema, etc.): VSS on RA    Exercises     Assessment/Plan    PT Assessment    PT Problem List         PT Treatment Interventions      PT Goals (Current goals can be found in the Care Plan section)  Acute Rehab PT Goals Patient Stated Goal: [name] home PT Goal Formulation: With patient Time For Goal Achievement: 04/24/23 Potential to Achieve Goals: Good    Frequency Min 3X/week     Co-evaluation               AM-PAC PT "6 Clicks" Mobility  Outcome Measure Help needed turning from your back to your side while in a flat bed without using bedrails?: A Little Help needed moving from lying on your back to sitting on the side of a flat bed without using bedrails?: A Little Help needed moving to and from a bed to a chair (including a wheelchair)?: A Little Help needed standing up from a chair using your arms (e.g., wheelchair or bedside chair)?: A Little Help needed to walk in hospital room?: A Little Help needed climbing 3-5 steps with a railing? : Total 6 Click Score: 16    End of Session Equipment Utilized During Treatment: Gait belt Activity Tolerance: Patient tolerated treatment  well Patient left: in chair;with call bell/phone within reach;with chair alarm set;with nursing/sitter in room Nurse Communication: Mobility status PT Visit Diagnosis: Unsteadiness on feet (R26.81);History of falling (Z91.81);Difficulty in walking, not elsewhere classified (R26.2)    Time: 0155-0220 PT Time Calculation (min) (ACUTE ONLY): 25 min   Charges:     PT Treatments $Gait Training: 23-37 mins PT General Charges $$ ACUTE PT VISIT: 1 Visit         Christene Lye, SPT Acute Rehabilitation Services [phone] Secure chat preferred    Christene Lye 04/12/2023, 3:39 PM

## 2023-04-12 NOTE — Progress Notes (Signed)
Mobility Specialist Progress Note   04/12/23 1117  Mobility  Activity Ambulated with assistance in room  Level of Assistance Moderate assist, patient does 50-74%  Assistive Device Front wheel walker  Distance Ambulated (ft) 24 ft (21ft + 35ft)  Activity Response Tolerated well  Mobility Referral Yes  $Mobility charge 1 Mobility  Mobility Specialist Start Time (ACUTE ONLY) 1040  Mobility Specialist Stop Time (ACUTE ONLY) 1115  Mobility Specialist Time Calculation (min) (ACUTE ONLY) 35 min   Received pt in chair c/o knee stiffness but agreeable to mobility. X2 attempts w/ ModA to stand from chair. Pt having difficulty standing upright d/t increased knee pain resulting in an antalgic like gait w/ a flexed trunk throughout ambulation in room but able to return back to chair w/o fault. Left w/ call bell in reach and MD present in room.   Frederico Hamman Mobility Specialist Please contact via SecureChat or  Rehab office at [phone]

## 2023-04-13 DIAGNOSIS — Z09 Encounter for follow-up examination after completed treatment for conditions other than malignant neoplasm: Secondary | ICD-10-CM | POA: Diagnosis not present

## 2023-04-13 DIAGNOSIS — Z87448 Personal history of other diseases of urinary system: Secondary | ICD-10-CM | POA: Diagnosis not present

## 2023-04-14 DIAGNOSIS — E8809 Other disorders of plasma-protein metabolism, not elsewhere classified: Secondary | ICD-10-CM | POA: Diagnosis not present

## 2023-04-14 DIAGNOSIS — K449 Diaphragmatic hernia without obstruction or gangrene: Secondary | ICD-10-CM | POA: Diagnosis not present

## 2023-04-14 DIAGNOSIS — R1013 Epigastric pain: Secondary | ICD-10-CM | POA: Diagnosis not present

## 2023-04-14 DIAGNOSIS — D631 Anemia in chronic kidney disease: Secondary | ICD-10-CM | POA: Diagnosis not present

## 2023-04-14 DIAGNOSIS — G4733 Obstructive sleep apnea (adult) (pediatric): Secondary | ICD-10-CM | POA: Diagnosis not present

## 2023-04-14 DIAGNOSIS — K219 Gastro-esophageal reflux disease without esophagitis: Secondary | ICD-10-CM | POA: Diagnosis not present

## 2023-04-14 DIAGNOSIS — I480 Paroxysmal atrial fibrillation: Secondary | ICD-10-CM | POA: Diagnosis not present

## 2023-04-14 DIAGNOSIS — Z951 Presence of aortocoronary bypass graft: Secondary | ICD-10-CM | POA: Diagnosis not present

## 2023-04-14 DIAGNOSIS — I700 Atherosclerosis of aorta: Secondary | ICD-10-CM | POA: Diagnosis not present

## 2023-04-14 DIAGNOSIS — R1310 Dysphagia, unspecified: Secondary | ICD-10-CM | POA: Diagnosis not present

## 2023-04-14 DIAGNOSIS — I2510 Atherosclerotic heart disease of native coronary artery without angina pectoris: Secondary | ICD-10-CM | POA: Diagnosis not present

## 2023-04-14 DIAGNOSIS — N1832 Chronic kidney disease, stage 3b: Secondary | ICD-10-CM | POA: Diagnosis not present

## 2023-04-14 DIAGNOSIS — E1122 Type 2 diabetes mellitus with diabetic chronic kidney disease: Secondary | ICD-10-CM | POA: Diagnosis not present

## 2023-04-14 DIAGNOSIS — Z1211 Encounter for screening for malignant neoplasm of colon: Secondary | ICD-10-CM | POA: Diagnosis not present

## 2023-04-14 DIAGNOSIS — Z7985 Long-term (current) use of injectable non-insulin antidiabetic drugs: Secondary | ICD-10-CM | POA: Diagnosis not present

## 2023-04-14 DIAGNOSIS — Z7901 Long term (current) use of anticoagulants: Secondary | ICD-10-CM | POA: Diagnosis not present

## 2023-04-14 DIAGNOSIS — I130 Hypertensive heart and chronic kidney disease with heart failure and stage 1 through stage 4 chronic kidney disease, or unspecified chronic kidney disease: Secondary | ICD-10-CM | POA: Diagnosis not present

## 2023-04-14 DIAGNOSIS — E871 Hypo-osmolality and hyponatremia: Secondary | ICD-10-CM | POA: Diagnosis not present

## 2023-04-14 DIAGNOSIS — E1151 Type 2 diabetes mellitus with diabetic peripheral angiopathy without gangrene: Secondary | ICD-10-CM | POA: Diagnosis not present

## 2023-04-14 DIAGNOSIS — Z85118 Personal history of other malignant neoplasm of bronchus and lung: Secondary | ICD-10-CM | POA: Diagnosis not present

## 2023-04-14 DIAGNOSIS — I252 Old myocardial infarction: Secondary | ICD-10-CM | POA: Diagnosis not present

## 2023-04-14 DIAGNOSIS — I5032 Chronic diastolic (congestive) heart failure: Secondary | ICD-10-CM | POA: Diagnosis not present

## 2023-04-14 DIAGNOSIS — K5904 Chronic idiopathic constipation: Secondary | ICD-10-CM | POA: Diagnosis not present

## 2023-04-14 DIAGNOSIS — Z86010 Personal history of colonic polyps: Secondary | ICD-10-CM | POA: Diagnosis not present

## 2023-04-14 DIAGNOSIS — E669 Obesity, unspecified: Secondary | ICD-10-CM | POA: Diagnosis not present

## 2023-04-14 DIAGNOSIS — E7800 Pure hypercholesterolemia, unspecified: Secondary | ICD-10-CM | POA: Diagnosis not present

## 2023-04-14 DIAGNOSIS — N179 Acute kidney failure, unspecified: Secondary | ICD-10-CM | POA: Diagnosis not present

## 2023-04-21 DIAGNOSIS — N1832 Chronic kidney disease, stage 3b: Secondary | ICD-10-CM | POA: Diagnosis not present

## 2023-04-21 DIAGNOSIS — I130 Hypertensive heart and chronic kidney disease with heart failure and stage 1 through stage 4 chronic kidney disease, or unspecified chronic kidney disease: Secondary | ICD-10-CM | POA: Diagnosis not present

## 2023-04-21 DIAGNOSIS — E1122 Type 2 diabetes mellitus with diabetic chronic kidney disease: Secondary | ICD-10-CM | POA: Diagnosis not present

## 2023-04-21 DIAGNOSIS — I5032 Chronic diastolic (congestive) heart failure: Secondary | ICD-10-CM | POA: Diagnosis not present

## 2023-04-24 DIAGNOSIS — Z1212 Encounter for screening for malignant neoplasm of rectum: Secondary | ICD-10-CM | POA: Diagnosis not present

## 2023-04-24 DIAGNOSIS — Z1211 Encounter for screening for malignant neoplasm of colon: Secondary | ICD-10-CM | POA: Diagnosis not present

## 2023-04-26 ENCOUNTER — Ambulatory Visit: Payer: PPO | Attending: Cardiovascular Disease

## 2023-04-26 DIAGNOSIS — I480 Paroxysmal atrial fibrillation: Secondary | ICD-10-CM

## 2023-04-26 DIAGNOSIS — Z7901 Long term (current) use of anticoagulants: Secondary | ICD-10-CM

## 2023-04-26 LAB — POCT INR: INR: 2.2 (ref 2.0–3.0)

## 2023-04-26 NOTE — Patient Instructions (Signed)
-  Continue taking warfarin 1/2 a tablet daily except for 1 tablet on Sunday, Tuesday and Thursday. Recheck INR in 6 weeks. Coumadin Clinic [phone] ?

## 2023-04-27 DIAGNOSIS — M1711 Unilateral primary osteoarthritis, right knee: Secondary | ICD-10-CM | POA: Diagnosis not present

## 2023-04-27 DIAGNOSIS — J449 Chronic obstructive pulmonary disease, unspecified: Secondary | ICD-10-CM | POA: Diagnosis not present

## 2023-04-27 DIAGNOSIS — I5043 Acute on chronic combined systolic (congestive) and diastolic (congestive) heart failure: Secondary | ICD-10-CM | POA: Diagnosis not present

## 2023-04-27 DIAGNOSIS — I5032 Chronic diastolic (congestive) heart failure: Secondary | ICD-10-CM | POA: Diagnosis not present

## 2023-04-27 DIAGNOSIS — M6281 Muscle weakness (generalized): Secondary | ICD-10-CM | POA: Diagnosis not present

## 2023-04-27 DIAGNOSIS — I5023 Acute on chronic systolic (congestive) heart failure: Secondary | ICD-10-CM | POA: Diagnosis not present

## 2023-04-27 DIAGNOSIS — S2232XS Fracture of one rib, left side, sequela: Secondary | ICD-10-CM | POA: Diagnosis not present

## 2023-04-28 DIAGNOSIS — Z794 Long term (current) use of insulin: Secondary | ICD-10-CM | POA: Diagnosis not present

## 2023-04-28 DIAGNOSIS — E1169 Type 2 diabetes mellitus with other specified complication: Secondary | ICD-10-CM | POA: Diagnosis not present

## 2023-04-28 DIAGNOSIS — I130 Hypertensive heart and chronic kidney disease with heart failure and stage 1 through stage 4 chronic kidney disease, or unspecified chronic kidney disease: Secondary | ICD-10-CM | POA: Diagnosis not present

## 2023-04-28 DIAGNOSIS — Z7901 Long term (current) use of anticoagulants: Secondary | ICD-10-CM | POA: Diagnosis not present

## 2023-04-28 DIAGNOSIS — I5032 Chronic diastolic (congestive) heart failure: Secondary | ICD-10-CM | POA: Diagnosis not present

## 2023-04-28 DIAGNOSIS — I480 Paroxysmal atrial fibrillation: Secondary | ICD-10-CM | POA: Diagnosis not present

## 2023-04-28 DIAGNOSIS — E785 Hyperlipidemia, unspecified: Secondary | ICD-10-CM | POA: Diagnosis not present

## 2023-04-28 DIAGNOSIS — I2510 Atherosclerotic heart disease of native coronary artery without angina pectoris: Secondary | ICD-10-CM | POA: Diagnosis not present

## 2023-04-28 DIAGNOSIS — I4821 Permanent atrial fibrillation: Secondary | ICD-10-CM | POA: Diagnosis not present

## 2023-04-28 DIAGNOSIS — E10319 Type 1 diabetes mellitus with unspecified diabetic retinopathy without macular edema: Secondary | ICD-10-CM | POA: Diagnosis not present

## 2023-04-28 DIAGNOSIS — I110 Hypertensive heart disease with heart failure: Secondary | ICD-10-CM | POA: Diagnosis not present

## 2023-04-28 DIAGNOSIS — Z7985 Long-term (current) use of injectable non-insulin antidiabetic drugs: Secondary | ICD-10-CM | POA: Diagnosis not present

## 2023-04-28 DIAGNOSIS — Z6841 Body Mass Index (BMI) 40.0 and over, adult: Secondary | ICD-10-CM | POA: Diagnosis not present

## 2023-04-28 DIAGNOSIS — N1831 Chronic kidney disease, stage 3a: Secondary | ICD-10-CM | POA: Diagnosis not present

## 2023-05-03 DIAGNOSIS — I129 Hypertensive chronic kidney disease with stage 1 through stage 4 chronic kidney disease, or unspecified chronic kidney disease: Secondary | ICD-10-CM | POA: Diagnosis not present

## 2023-05-03 DIAGNOSIS — I130 Hypertensive heart and chronic kidney disease with heart failure and stage 1 through stage 4 chronic kidney disease, or unspecified chronic kidney disease: Secondary | ICD-10-CM | POA: Diagnosis not present

## 2023-05-03 DIAGNOSIS — E1059 Type 1 diabetes mellitus with other circulatory complications: Secondary | ICD-10-CM | POA: Diagnosis not present

## 2023-05-03 DIAGNOSIS — I2510 Atherosclerotic heart disease of native coronary artery without angina pectoris: Secondary | ICD-10-CM | POA: Diagnosis not present

## 2023-05-03 DIAGNOSIS — I4821 Permanent atrial fibrillation: Secondary | ICD-10-CM | POA: Diagnosis not present

## 2023-05-03 DIAGNOSIS — E10319 Type 1 diabetes mellitus with unspecified diabetic retinopathy without macular edema: Secondary | ICD-10-CM | POA: Diagnosis not present

## 2023-05-03 DIAGNOSIS — N1831 Chronic kidney disease, stage 3a: Secondary | ICD-10-CM | POA: Diagnosis not present

## 2023-05-03 DIAGNOSIS — E782 Mixed hyperlipidemia: Secondary | ICD-10-CM | POA: Diagnosis not present

## 2023-05-09 DIAGNOSIS — I5032 Chronic diastolic (congestive) heart failure: Secondary | ICD-10-CM | POA: Diagnosis not present

## 2023-05-09 DIAGNOSIS — M6281 Muscle weakness (generalized): Secondary | ICD-10-CM | POA: Diagnosis not present

## 2023-05-09 DIAGNOSIS — S2232XS Fracture of one rib, left side, sequela: Secondary | ICD-10-CM | POA: Diagnosis not present

## 2023-05-09 DIAGNOSIS — I5023 Acute on chronic systolic (congestive) heart failure: Secondary | ICD-10-CM | POA: Diagnosis not present

## 2023-05-09 DIAGNOSIS — J449 Chronic obstructive pulmonary disease, unspecified: Secondary | ICD-10-CM | POA: Diagnosis not present

## 2023-05-09 DIAGNOSIS — I130 Hypertensive heart and chronic kidney disease with heart failure and stage 1 through stage 4 chronic kidney disease, or unspecified chronic kidney disease: Secondary | ICD-10-CM | POA: Diagnosis not present

## 2023-05-09 DIAGNOSIS — E10319 Type 1 diabetes mellitus with unspecified diabetic retinopathy without macular edema: Secondary | ICD-10-CM | POA: Diagnosis not present

## 2023-05-09 DIAGNOSIS — M1711 Unilateral primary osteoarthritis, right knee: Secondary | ICD-10-CM | POA: Diagnosis not present

## 2023-05-09 DIAGNOSIS — N1831 Chronic kidney disease, stage 3a: Secondary | ICD-10-CM | POA: Diagnosis not present

## 2023-05-09 DIAGNOSIS — I4821 Permanent atrial fibrillation: Secondary | ICD-10-CM | POA: Diagnosis not present

## 2023-05-09 DIAGNOSIS — I5043 Acute on chronic combined systolic (congestive) and diastolic (congestive) heart failure: Secondary | ICD-10-CM | POA: Diagnosis not present

## 2023-05-18 ENCOUNTER — Telehealth: Payer: Self-pay

## 2023-05-18 NOTE — Telephone Encounter (Signed)
Returned call to pt in regards to b/l LE swelling and tightness. Pt stated that her legs are swollen constantly despite keeping them elevated when she can. She stated that she does no see the wound center anymore because she does not have any wounds. Pt denied pain/tenderness, warmth and color change to LE. OV scheduled.

## 2023-05-19 ENCOUNTER — Other Ambulatory Visit: Payer: Self-pay | Admitting: *Deleted

## 2023-05-19 DIAGNOSIS — I872 Venous insufficiency (chronic) (peripheral): Secondary | ICD-10-CM

## 2023-05-26 ENCOUNTER — Ambulatory Visit (HOSPITAL_COMMUNITY)
Admission: RE | Admit: 2023-05-26 | Discharge: 2023-05-26 | Disposition: A | Discharge status: Home / Self Care | Payer: PPO | Source: Ambulatory Visit | Attending: Vascular Surgery | Admitting: Vascular Surgery

## 2023-05-26 DIAGNOSIS — I872 Venous insufficiency (chronic) (peripheral): Secondary | ICD-10-CM | POA: Diagnosis not present

## 2023-05-27 DIAGNOSIS — I110 Hypertensive heart disease with heart failure: Secondary | ICD-10-CM | POA: Diagnosis not present

## 2023-05-27 DIAGNOSIS — R1310 Dysphagia, unspecified: Secondary | ICD-10-CM | POA: Diagnosis not present

## 2023-05-27 DIAGNOSIS — I2510 Atherosclerotic heart disease of native coronary artery without angina pectoris: Secondary | ICD-10-CM | POA: Diagnosis not present

## 2023-05-27 DIAGNOSIS — E1169 Type 2 diabetes mellitus with other specified complication: Secondary | ICD-10-CM | POA: Diagnosis not present

## 2023-05-27 DIAGNOSIS — I5022 Chronic systolic (congestive) heart failure: Secondary | ICD-10-CM | POA: Diagnosis not present

## 2023-05-27 DIAGNOSIS — I480 Paroxysmal atrial fibrillation: Secondary | ICD-10-CM | POA: Diagnosis not present

## 2023-05-27 DIAGNOSIS — R54 Age-related physical debility: Secondary | ICD-10-CM | POA: Diagnosis not present

## 2023-05-27 DIAGNOSIS — I10 Essential (primary) hypertension: Secondary | ICD-10-CM | POA: Diagnosis not present

## 2023-05-27 DIAGNOSIS — E785 Hyperlipidemia, unspecified: Secondary | ICD-10-CM | POA: Diagnosis not present

## 2023-05-28 DIAGNOSIS — I5043 Acute on chronic combined systolic (congestive) and diastolic (congestive) heart failure: Secondary | ICD-10-CM | POA: Diagnosis not present

## 2023-05-28 DIAGNOSIS — M1711 Unilateral primary osteoarthritis, right knee: Secondary | ICD-10-CM | POA: Diagnosis not present

## 2023-05-28 DIAGNOSIS — M6281 Muscle weakness (generalized): Secondary | ICD-10-CM | POA: Diagnosis not present

## 2023-05-28 DIAGNOSIS — S2232XS Fracture of one rib, left side, sequela: Secondary | ICD-10-CM | POA: Diagnosis not present

## 2023-05-28 DIAGNOSIS — I5032 Chronic diastolic (congestive) heart failure: Secondary | ICD-10-CM | POA: Diagnosis not present

## 2023-05-28 DIAGNOSIS — J449 Chronic obstructive pulmonary disease, unspecified: Secondary | ICD-10-CM | POA: Diagnosis not present

## 2023-05-28 DIAGNOSIS — I5023 Acute on chronic systolic (congestive) heart failure: Secondary | ICD-10-CM | POA: Diagnosis not present

## 2023-06-03 ENCOUNTER — Ambulatory Visit: Payer: PPO

## 2023-06-07 ENCOUNTER — Ambulatory Visit: Payer: PPO | Attending: Cardiology

## 2023-06-07 DIAGNOSIS — Z7901 Long term (current) use of anticoagulants: Secondary | ICD-10-CM

## 2023-06-07 DIAGNOSIS — I480 Paroxysmal atrial fibrillation: Secondary | ICD-10-CM | POA: Diagnosis not present

## 2023-06-07 LAB — POCT INR: INR: 1.6 — AB (ref 2.0–3.0)

## 2023-06-07 NOTE — Patient Instructions (Signed)
TAKE 2 TABLETS TODAY ONLY THEN Continue taking warfarin 1/2 a tablet daily except for 1 tablet on Sunday, Tuesday and Thursday. Recheck INR in 3 weeks [facility] [phone]

## 2023-06-09 ENCOUNTER — Ambulatory Visit: Payer: PPO | Admitting: Physician Assistant

## 2023-06-09 VITALS — BP 156/84 | HR 76 | Temp 98.0°F | Resp 16 | Ht 63.0 in | Wt 244.0 lb

## 2023-06-09 DIAGNOSIS — I5032 Chronic diastolic (congestive) heart failure: Secondary | ICD-10-CM | POA: Diagnosis not present

## 2023-06-09 DIAGNOSIS — M6281 Muscle weakness (generalized): Secondary | ICD-10-CM | POA: Diagnosis not present

## 2023-06-09 DIAGNOSIS — M1711 Unilateral primary osteoarthritis, right knee: Secondary | ICD-10-CM | POA: Diagnosis not present

## 2023-06-09 DIAGNOSIS — M7989 Other specified soft tissue disorders: Secondary | ICD-10-CM

## 2023-06-09 DIAGNOSIS — S2232XS Fracture of one rib, left side, sequela: Secondary | ICD-10-CM | POA: Diagnosis not present

## 2023-06-09 DIAGNOSIS — J449 Chronic obstructive pulmonary disease, unspecified: Secondary | ICD-10-CM | POA: Diagnosis not present

## 2023-06-09 DIAGNOSIS — I5043 Acute on chronic combined systolic (congestive) and diastolic (congestive) heart failure: Secondary | ICD-10-CM | POA: Diagnosis not present

## 2023-06-09 DIAGNOSIS — I5023 Acute on chronic systolic (congestive) heart failure: Secondary | ICD-10-CM | POA: Diagnosis not present

## 2023-06-09 NOTE — Progress Notes (Signed)
[facility] [place]     She was last seen 09/24/22 for LE edema with history of wounds requiring wound center [place] boot care.  She had significant edema due to her chronic diastolic heart failure and staying in a dependent position all day.  She does minimal ambulation with a walker, but stays seated or WC dependent most of the time.  She has a HH RN/aide that helps her with daily ADL's.  She has chronic LE skin changes without new open wounds. She denies rest pain or non healing wound on her feet and toes.  She denies short distance claudication.    She has maintained active B LE motor in a seated position and states she tries to do seated exercises daily.    The pt is on a statin for cholesterol management.    The pt is not on an aspirin.    Other AC:  coumadin The pt is on diuretic, ACEI, BB for hypertension.  The pt does  have diabetes. Tobacco hx:  former  Past Medical History:  Diagnosis Date   Arthritis    "knees" (02/05/2016)   Cancer (HCC) 5/10   LUL Vats    CHF (congestive heart failure) (HCC)    Coronary artery disease    GERD (gastroesophageal reflux disease)    tx meds   H/O hiatal hernia    History of blood transfusion "several"   last april 2015 s/p knee surgery (02/05/2016)   Hypercholesteremia    Hypertension    Lung cancer (HCC)    2010, surgery 18% left lung no radiation or chemo   Non-STEMI (non-ST elevated myocardial infarction) (HCC) 04/07/2012   See cath results below   Paroxysmal atrial fibrillation (HCC) 04/07/2012   On Warfarin   Peripheral vascular disease (HCC) 8/12   Lt SFA PTA   Presence of stent in LAD coronary artery 04/07/2012   Xience Expedition DES 2.75 mm x 18 mm (dilated to 3.0 mm)   Sleep apnea    does not wear CPAP   Type II diabetes mellitus (HCC)    insulin dependent    Past Surgical History:  Procedure Laterality Date   ABDOMINAL HYSTERECTOMY  1990   BREAST EXCISIONAL BIOPSY Right    CARDIAC CATHETERIZATION   11/04/2008   patent RCA, LM, and Circ, nl EF   CARDIAC CATHETERIZATION  02/20/2002   patent coronaries with the only abnormality being a smooth luminla irregularity in the mid intermediate ramus branch no felt to be hemodynamically significant, nl LV   CARDIAC CATHETERIZATION N/A 02/05/2016   Procedure: Left Heart Cath and Coronary Angiography;  Surgeon: Marykay Lex, MD;  Location: [facility] INVASIVE CV LAB;  Service: Cardiovascular;  Laterality: N/A;   CARDIAC CATHETERIZATION N/A 02/05/2016   Procedure: Coronary Stent Intervention;  Surgeon: Marykay Lex, MD;  Location: [facility] INVASIVE CV LAB;  Service: Cardiovascular;  Laterality: N/A;   CARDIOVERSION N/A 07/16/2021   Procedure: CARDIOVERSION;  Surgeon: Christell Constant, MD;  Location: [facility] ENDOSCOPY;  Service: Cardiovascular;  Laterality: N/A;   CATARACT EXTRACTION W/ INTRAOCULAR LENS  IMPLANT, BILATERAL  2013   CORONARY ANGIOPLASTY WITH STENT PLACEMENT  04/07/2012   Xience Expedition DES 2.53mm x 18 mm (dilated to 3.0 mm) to the prox LAD    CORONARY ANGIOPLASTY WITH STENT PLACEMENT  2013; 2015   CORONARY ARTERY BYPASS GRAFT N/A 08/17/2017   Procedure: CORONARY ARTERY BYPASS GRAFTING (CABG) TIMES 1 USING LEFT INTERNAL MAMMARY ARTERY;  Surgeon: Loreli Slot, MD;  Location: [facility] OR;  Service: Open Heart Surgery;  Laterality: N/A;   DILATION AND CURETTAGE OF UTERUS  <1990   ESOPHAGOGASTRODUODENOSCOPY N/A 05/23/2015   Procedure: ESOPHAGOGASTRODUODENOSCOPY (EGD);  Surgeon: Jeani Hawking, MD;  Location: [facility] ENDOSCOPY;  Service: Endoscopy;  Laterality: N/A;   ESOPHAGOGASTRODUODENOSCOPY (EGD) WITH PROPOFOL N/A 06/13/2015   Procedure: ESOPHAGOGASTRODUODENOSCOPY (EGD) WITH PROPOFOL;  Surgeon: Jeani Hawking, MD;  Location: [facility] ENDOSCOPY;  Service: Endoscopy;  Laterality: N/A;   ESOPHAGOGASTRODUODENOSCOPY (EGD) WITH PROPOFOL N/A 04/07/2018   Procedure: ESOPHAGOGASTRODUODENOSCOPY (EGD) WITH PROPOFOL;  Surgeon: Jeani Hawking, MD;  Location: [facility] ENDOSCOPY;   Service: Endoscopy;  Laterality: N/A;  fluoroscopy is needed   FRACTURE SURGERY     HAMMER TOE SURGERY Bilateral 1993   with screws, on screw removed   JOINT REPLACEMENT     KNEE ARTHROSCOPY Bilateral    left x2, right x1   LAPAROSCOPIC CHOLECYSTECTOMY     LEFT HEART CATH AND CORONARY ANGIOGRAPHY N/A 08/11/2017   Procedure: LEFT HEART CATH AND CORONARY ANGIOGRAPHY;  Surgeon: [place]azil[place], Peter M, MD;  Location: [facility] INVASIVE CV LAB;  Service: Cardiovascular;  Laterality: N/A;   LEFT HEART CATHETERIZATION WITH CORONARY ANGIOGRAM N/A 04/07/2012   Procedure: LEFT HEART CATHETERIZATION WITH CORONARY ANGIOGRAM;  Surgeon: Runell Gess, MD;  Location: [facility] CATH LAB;  Service: Cardiovascular;  Laterality: N/A;   LEFT HEART CATHETERIZATION WITH CORONARY ANGIOGRAM N/A 05/27/2014   Procedure: LEFT HEART CATHETERIZATION WITH CORONARY ANGIOGRAM;  Surgeon: Runell Gess, MD; LAD 99% ISR, CFX 50-60%, RCA (dominant) no sig dz, EF 60%   LOWER EXTREMITY ANGIOGRAM  05/17/11   directional atherectomy to the prox L SFA using a LX Man TurboHawk, ballooned with a [place] balloon    ORIF ANKLE FRACTURE  07/09/2012   Procedure: OPEN REDUCTION INTERNAL FIXATION (ORIF) ANKLE FRACTURE;  Surgeon: Cammy Copa, MD;  Location: [facility] ORS;  Service: Orthopedics;  Laterality: Left;  open reduction internal fixation trimalleolar ankle fracture medial malleolous fixation   PERCUTANEOUS CORONARY STENT INTERVENTION (PCI-S) N/A 04/07/2012   Procedure: PERCUTANEOUS CORONARY STENT INTERVENTION (PCI-S);  Surgeon: Runell Gess, MD;  Location: [facility] CATH LAB;  Service: Cardiovascular;  Laterality: N/A;   PERCUTANEOUS CORONARY STENT INTERVENTION (PCI-S)  05/27/2014   Procedure: PERCUTANEOUS CORONARY STENT INTERVENTION (PCI-S);  Surgeon: Runell Gess, MD; 3 mm x 12 mm long Xience Xpedition DES to the proximal LAD   SAVORY DILATION N/A 06/13/2015   Procedure: SAVORY DILATION;  Surgeon: Jeani Hawking, MD;  Location: [facility] ENDOSCOPY;  Service:  Endoscopy;  Laterality: N/A;   SAVORY DILATION N/A 04/07/2018   Procedure: SAVORY DILATION;  Surgeon: Jeani Hawking, MD;  Location: [facility] ENDOSCOPY;  Service: Endoscopy;  Laterality: N/A;   TEE WITHOUT CARDIOVERSION N/A 08/17/2017   Procedure: TRANSESOPHAGEAL ECHOCARDIOGRAM (TEE);  Surgeon: Loreli Slot, MD;  Location: [facility] OR;  Service: Open Heart Surgery;  Laterality: N/A;   TEE WITHOUT CARDIOVERSION N/A 07/16/2021   Procedure: TRANSESOPHAGEAL ECHOCARDIOGRAM (TEE);  Surgeon: Christell Constant, MD;  Location: [facility] ENDOSCOPY;  Service: Cardiovascular;  Laterality: N/A;   TONSILLECTOMY     TOTAL KNEE ARTHROPLASTY Left 2003   TOTAL KNEE REVISION Left 11/05/2013   Procedure: LEFT TOTAL KNEE RESECTION;  Surgeon: Shelda Pal, MD;  Location: [facility] ORS;  Service: Orthopedics;  Laterality: Left;   TOTAL KNEE REVISION Left 2007   opened in 2006 and cleaned out, 2007 revision   TOTAL KNEE REVISION Left 12/31/2013   Procedure: RE-INPLANTATION LEFT TOTAL KNEE ;  Surgeon: Shelda Pal, MD;  Location: [facility] ORS;  Service: Orthopedics;  Laterality: Left;   VIDEO ASSISTED THORACOSCOPY (VATS)/ LOBECTOMY  01/2009    Social History   Socioeconomic History   Marital status: Single    Spouse name: Not on file   Number of children: Not on file   Years of education: Not on file   Highest education level: Not on file  Occupational History   Occupation: Retired  Tobacco Use   Smoking status: Former    Current packs/day: 0.00    Average packs/day: 1 pack/day for 20.0 years (20.0 ttl pk-yrs)    Types: Cigarettes    Start date: 09/27/1964    Quit date: 09/27/1984    Years since quitting: 38.7    Passive exposure: Never   Smokeless tobacco: Never  Vaping Use   Vaping status: Never Used  Substance and Sexual Activity   Alcohol use: No   Drug use: No   Sexual activity: Not on file  Other Topics Concern   Not on file  Social History Narrative   Per patient, she lives with son   Social Determinants of  Health   Financial Resource Strain: Not on file  Food Insecurity: No Food Insecurity (04/10/2023)   Hunger Vital Sign    Worried About Running Out of Food in the Last Year: Never true    Ran Out of Food in the Last Year: Never true  Transportation Needs: No Transportation Needs (04/10/2023)   PRAPARE - Administrator, Civil Service (Medical): No    Lack of Transportation (Non-Medical): No  Physical Activity: Not on file  Stress: Stress Concern Present (11/14/2019)   [employer] of Occupational Health - Occupational Stress Questionnaire    Feeling of Stress : Very much  Social Connections: Not on file  Intimate Partner Violence: Not At Risk (04/10/2023)   Humiliation, Afraid, Rape, and Kick questionnaire    Fear of Current or Ex-Partner: No    Emotionally Abused: No    Physically Abused: No    Sexually Abused: No    Family History  Problem Relation Age of Onset   Hypertension Mother    Coronary artery disease Brother    Hypertension Sister     Current Outpatient Medications on File Prior to Visit  Medication Sig Dispense Refill   acetaminophen (TYLENOL) 325 MG tablet Take 2 tablets (650 mg total) by mouth every 6 (six) hours as needed for mild pain (or Fever >/= 101). 20 tablet 0   Blood Glucose Monitoring Suppl (ONETOUCH VERIO FLEX SYSTEM) w/Device KIT      Cholecalciferol (VITAMIN D3) 50 MCG (2000 UT) TABS Take 2,000 Units by mouth daily.     diclofenac Sodium (VOLTAREN) 1 % GEL Apply 2 g topically 4 (four) times daily. Right knee 50 g 0   furosemide (LASIX) 40 MG tablet Take 1 tablet (40 mg total) by mouth 3 (three) times a week. TAKE Monday Wednesday AND FRIDAY (Patient taking differently: Take 40 mg by mouth every Monday, Wednesday, and Friday.) 36 tablet 3   Lancets (ONETOUCH DELICA PLUS LANCET33G) MISC Apply topically.     lisinopril (ZESTRIL) 10 MG tablet Take 1 tablet (10 mg total) by mouth daily. 30 tablet 0   metoprolol succinate (TOPROL-XL) 50 MG 24 hr  tablet TAKE 1 TABLET BY MOUTH DAILY. TAKE WITH OR IMMEDIATELY FOLLOWING A MEAL. 90 tablet 1   metoprolol tartrate (LOPRESSOR) 25 MG tablet Take 1 table as needed for heart rate above 120bpm. 90 tablet 3  ONETOUCH VERIO test strip 1 each 2 (two) times daily.     oxyCODONE-acetaminophen (PERCOCET/ROXICET) 5-325 MG tablet Take 1 tablet by mouth every 6 (six) hours as needed for severe pain. 10 tablet 0   pantoprazole (PROTONIX) 40 MG tablet Take 1 tablet (40 mg total) by mouth daily. (Patient taking differently: Take 40 mg by mouth every evening.) 90 tablet 3   polyethylene glycol (MIRALAX / GLYCOLAX) 17 g packet Take 17 g by mouth daily. 14 each 0   rosuvastatin (CRESTOR) 40 MG tablet Take 40 mg by mouth daily.     Semaglutide,0.25 or 0.5MG /DOS, (OZEMPIC, 0.25 OR 0.5 MG/DOSE,) 2 MG/1.5ML SOPN Inject into the skin.     spironolactone (ALDACTONE) 25 MG tablet Take 1 tablet (25 mg total) by mouth 3 (three) times a week. TAKE Monday Wednesday AND Friday (Patient taking differently: Take 25 mg by mouth every Monday, Wednesday, and Friday.) 36 tablet 3   warfarin (COUMADIN) 7.5 MG tablet TAKE 1/2 TABLET TO 1 TABLET BY MOUTH DAILY AS DIRECTED BY COUMADIN CLINIC 65 tablet 1   insulin NPH-regular Human (70-30) 100 UNIT/ML injection Inject 20 Units into the skin 2 (two) times daily with a meal. 12 mL 0   No current facility-administered medications on file prior to visit.    Allergies as of 06/09/2023 - Review Complete 04/10/2023  Allergen Reaction Noted   Levofloxacin Itching 11/22/2011   Morphine and codeine Itching and Other (See Comments) 11/22/2011     ROS:   General:  No weight loss, Fever, chills  HEENT: No recent headaches, no nasal bleeding, no visual changes, no sore throat  Neurologic: No dizziness, blackouts, seizures. No recent symptoms of stroke or mini- stroke. No recent episodes of slurred speech, or temporary blindness.  Cardiac: No recent episodes of chest pain/pressure, no  shortness of breath at rest.  No shortness of breath with exertion.  Denies history of atrial fibrillation or irregular heartbeat  Vascular: No history of rest pain in feet.  No history of claudication.  No history of non-healing ulcer, No history of DVT   Pulmonary: No home oxygen, no productive cough, no hemoptysis,  No asthma or wheezing  Musculoskeletal:  [ ]  Arthritis, [ ]  Low back pain,  [ ]  Joint pain  Hematologic:No history of hypercoagulable state.  No history of easy bleeding.  No history of anemia  Gastrointestinal: No hematochezia or melena,  No gastroesophageal reflux, no trouble swallowing  Urinary: [ ]  chronic Kidney disease, [ ]  on HD - [ ]  MWF or [ ]  TTHS, [ ]  Burning with urination, [ ]  Frequent urination, [ ]  Difficulty urinating;   Skin: No rashes  Psychological: No history of anxiety,  No history of depression  Physical Examination  Vitals:   06/09/23 1444  BP: (!) 156/84  Pulse: 76  Resp: 16  Temp: 98 F (36.7 C)  TempSrc: Temporal  SpO2: 98%  Weight: 244 lb (110.7 kg)  Height: 5\' 3"  (1.6 m)    Body mass index is 43.22 kg/m.  General:  Alert and oriented, no acute distress HEENT: Normal Neck: No bruit or JVD Pulmonary: Clear to auscultation bilaterally Cardiac: Regular Rate and Rhythm without murmur Abdomen: Soft, non-tender, non-distended, no mass, no scars Skin: No rash Extremity Pulses: PT/DP doppler signals intact B LE, no ischemic changes to feet or toes Musculoskeletal: chronic edema  Neurologic: Upper and lower extremity motor grossly intact and symmetric  DATA: +--------------+---------+------+-----------+------------+-------------+  LEFT         Reflux  NoRefluxReflux TimeDiameter cmsComments                               Yes                                        +--------------+---------+------+-----------+------------+-------------+  CFV          no                                                    +--------------+---------+------+-----------+------------+-------------+  FV mid        no                                                   +--------------+---------+------+-----------+------------+-------------+  Popliteal    no                                                   +--------------+---------+------+-----------+------------+-------------+  GSV at [facility]    no                            0.66                   +--------------+---------+------+-----------+------------+-------------+  GSV prox thigh          yes    >500 ms      0.58    out of fascia  +--------------+---------+------+-----------+------------+-------------+  GSV mid thigh                                NV                    +--------------+---------+------+-----------+------------+-------------+  GSV dist thigh                               NV                    +--------------+---------+------+-----------+------------+-------------+  GSV at knee                                  NV                    +--------------+---------+------+-----------+------------+-------------+  GSV prox calf                                NV                    +--------------+---------+------+-----------+------------+-------------+  GSV mid calf  NV                    +--------------+---------+------+-----------+------------+-------------+  SSV Pop Fossa no                            0.43                   +--------------+---------+------+-----------+------------+-------------+  SSV prox calf no                            0.26                   +--------------+---------+------+-----------+------------+-------------+  SSV mid calf  no                            0.27                   +--------------+---------+------+-----------+------------+-------------+  AASV o                                       NV                     +--------------+---------+------+-----------+------------+-------------+         Summary:  Left:  - No evidence of obvious deep vein thrombosis seen in the Left lower  extremity, from the common femoral through the popliteal veins, limited  visualiztion.    - No evidence of superficial venous reflux seen in the left short  saphenous vein.  - Venous reflux is noted in the left greater saphenous vein in the  proximal thigh. Varicosity noted branching from the proximal Greater  Saphenous vein extending distally with no obvious reflux.   Assessment/Plan: Chronic CHF with dependent edema  She had mixed arterial venous disease and lipedema from morbid obesity. She was instructed to continue compression with elevation.  Her HH RN will order compression garments.  I did go over seated LE exercises with demonstration to help her keep her LE's strong and increase venous return.  She was not an open surgical candidate. A discussion to be had for possible endovascular treatment if the wounds worsened. If she develops non healing wound she will need serial una boots likely.              Mosetta Pigeon PA-C [facility] [place] Office: [phone]  MD in clinic [place]

## 2023-06-10 ENCOUNTER — Encounter: Payer: Self-pay | Admitting: Physician Assistant

## 2023-06-20 DIAGNOSIS — B351 Tinea unguium: Secondary | ICD-10-CM | POA: Diagnosis not present

## 2023-06-20 DIAGNOSIS — M2041 Other hammer toe(s) (acquired), right foot: Secondary | ICD-10-CM | POA: Diagnosis not present

## 2023-06-20 DIAGNOSIS — M722 Plantar fascial fibromatosis: Secondary | ICD-10-CM | POA: Diagnosis not present

## 2023-06-20 DIAGNOSIS — M24571 Contracture, right ankle: Secondary | ICD-10-CM | POA: Diagnosis not present

## 2023-06-20 DIAGNOSIS — E1151 Type 2 diabetes mellitus with diabetic peripheral angiopathy without gangrene: Secondary | ICD-10-CM | POA: Diagnosis not present

## 2023-06-20 DIAGNOSIS — M79675 Pain in left toe(s): Secondary | ICD-10-CM | POA: Diagnosis not present

## 2023-06-20 DIAGNOSIS — M7661 Achilles tendinitis, right leg: Secondary | ICD-10-CM | POA: Diagnosis not present

## 2023-06-20 DIAGNOSIS — L84 Corns and callosities: Secondary | ICD-10-CM | POA: Diagnosis not present

## 2023-06-24 DIAGNOSIS — E785 Hyperlipidemia, unspecified: Secondary | ICD-10-CM | POA: Diagnosis not present

## 2023-06-24 DIAGNOSIS — E1169 Type 2 diabetes mellitus with other specified complication: Secondary | ICD-10-CM | POA: Diagnosis not present

## 2023-06-24 DIAGNOSIS — I480 Paroxysmal atrial fibrillation: Secondary | ICD-10-CM | POA: Diagnosis not present

## 2023-06-24 DIAGNOSIS — I10 Essential (primary) hypertension: Secondary | ICD-10-CM | POA: Diagnosis not present

## 2023-06-24 DIAGNOSIS — I2510 Atherosclerotic heart disease of native coronary artery without angina pectoris: Secondary | ICD-10-CM | POA: Diagnosis not present

## 2023-06-24 DIAGNOSIS — R54 Age-related physical debility: Secondary | ICD-10-CM | POA: Diagnosis not present

## 2023-06-27 DIAGNOSIS — I8393 Asymptomatic varicose veins of bilateral lower extremities: Secondary | ICD-10-CM | POA: Diagnosis not present

## 2023-06-27 DIAGNOSIS — M24571 Contracture, right ankle: Secondary | ICD-10-CM | POA: Diagnosis not present

## 2023-06-27 DIAGNOSIS — Z794 Long term (current) use of insulin: Secondary | ICD-10-CM | POA: Diagnosis not present

## 2023-06-27 DIAGNOSIS — I480 Paroxysmal atrial fibrillation: Secondary | ICD-10-CM | POA: Diagnosis not present

## 2023-06-27 DIAGNOSIS — M722 Plantar fascial fibromatosis: Secondary | ICD-10-CM | POA: Diagnosis not present

## 2023-06-27 DIAGNOSIS — M6281 Muscle weakness (generalized): Secondary | ICD-10-CM | POA: Diagnosis not present

## 2023-06-27 DIAGNOSIS — B351 Tinea unguium: Secondary | ICD-10-CM | POA: Diagnosis not present

## 2023-06-27 DIAGNOSIS — L84 Corns and callosities: Secondary | ICD-10-CM | POA: Diagnosis not present

## 2023-06-27 DIAGNOSIS — G4730 Sleep apnea, unspecified: Secondary | ICD-10-CM | POA: Diagnosis not present

## 2023-06-27 DIAGNOSIS — I5023 Acute on chronic systolic (congestive) heart failure: Secondary | ICD-10-CM | POA: Diagnosis not present

## 2023-06-27 DIAGNOSIS — I252 Old myocardial infarction: Secondary | ICD-10-CM | POA: Diagnosis not present

## 2023-06-27 DIAGNOSIS — E785 Hyperlipidemia, unspecified: Secondary | ICD-10-CM | POA: Diagnosis not present

## 2023-06-27 DIAGNOSIS — I110 Hypertensive heart disease with heart failure: Secondary | ICD-10-CM | POA: Diagnosis not present

## 2023-06-27 DIAGNOSIS — M7661 Achilles tendinitis, right leg: Secondary | ICD-10-CM | POA: Diagnosis not present

## 2023-06-27 DIAGNOSIS — Z556 Problems related to health literacy: Secondary | ICD-10-CM | POA: Diagnosis not present

## 2023-06-27 DIAGNOSIS — M1990 Unspecified osteoarthritis, unspecified site: Secondary | ICD-10-CM | POA: Diagnosis not present

## 2023-06-27 DIAGNOSIS — Z7901 Long term (current) use of anticoagulants: Secondary | ICD-10-CM | POA: Diagnosis not present

## 2023-06-27 DIAGNOSIS — Z951 Presence of aortocoronary bypass graft: Secondary | ICD-10-CM | POA: Diagnosis not present

## 2023-06-27 DIAGNOSIS — M2041 Other hammer toe(s) (acquired), right foot: Secondary | ICD-10-CM | POA: Diagnosis not present

## 2023-06-27 DIAGNOSIS — Z85118 Personal history of other malignant neoplasm of bronchus and lung: Secondary | ICD-10-CM | POA: Diagnosis not present

## 2023-06-27 DIAGNOSIS — K219 Gastro-esophageal reflux disease without esophagitis: Secondary | ICD-10-CM | POA: Diagnosis not present

## 2023-06-27 DIAGNOSIS — I2510 Atherosclerotic heart disease of native coronary artery without angina pectoris: Secondary | ICD-10-CM | POA: Diagnosis not present

## 2023-06-27 DIAGNOSIS — S2232XS Fracture of one rib, left side, sequela: Secondary | ICD-10-CM | POA: Diagnosis not present

## 2023-06-27 DIAGNOSIS — E1136 Type 2 diabetes mellitus with diabetic cataract: Secondary | ICD-10-CM | POA: Diagnosis not present

## 2023-06-27 DIAGNOSIS — J449 Chronic obstructive pulmonary disease, unspecified: Secondary | ICD-10-CM | POA: Diagnosis not present

## 2023-06-27 DIAGNOSIS — I5043 Acute on chronic combined systolic (congestive) and diastolic (congestive) heart failure: Secondary | ICD-10-CM | POA: Diagnosis not present

## 2023-06-27 DIAGNOSIS — E1151 Type 2 diabetes mellitus with diabetic peripheral angiopathy without gangrene: Secondary | ICD-10-CM | POA: Diagnosis not present

## 2023-06-27 DIAGNOSIS — M1711 Unilateral primary osteoarthritis, right knee: Secondary | ICD-10-CM | POA: Diagnosis not present

## 2023-06-27 DIAGNOSIS — I509 Heart failure, unspecified: Secondary | ICD-10-CM | POA: Diagnosis not present

## 2023-06-27 DIAGNOSIS — I5032 Chronic diastolic (congestive) heart failure: Secondary | ICD-10-CM | POA: Diagnosis not present

## 2023-06-27 DIAGNOSIS — D649 Anemia, unspecified: Secondary | ICD-10-CM | POA: Diagnosis not present

## 2023-06-28 ENCOUNTER — Ambulatory Visit: Payer: PPO | Attending: Cardiology | Admitting: *Deleted

## 2023-06-28 DIAGNOSIS — Z7901 Long term (current) use of anticoagulants: Secondary | ICD-10-CM | POA: Diagnosis not present

## 2023-06-28 DIAGNOSIS — Z5181 Encounter for therapeutic drug level monitoring: Secondary | ICD-10-CM

## 2023-06-28 DIAGNOSIS — I480 Paroxysmal atrial fibrillation: Secondary | ICD-10-CM | POA: Diagnosis not present

## 2023-06-28 LAB — POCT INR: INR: 1.8 — AB (ref 2.0–3.0)

## 2023-06-28 NOTE — Patient Instructions (Signed)
Description   TAKE 2 TABLETS TODAY ONLY THEN START taking warfarin 1 tablet daily except for 1/2 tablet on Monday, Wednesday, and Friday. Recheck INR in 3 weeks [facility]in [facility] [phone]

## 2023-07-01 DIAGNOSIS — E1169 Type 2 diabetes mellitus with other specified complication: Secondary | ICD-10-CM | POA: Diagnosis not present

## 2023-07-01 DIAGNOSIS — I110 Hypertensive heart disease with heart failure: Secondary | ICD-10-CM | POA: Diagnosis not present

## 2023-07-01 DIAGNOSIS — Z794 Long term (current) use of insulin: Secondary | ICD-10-CM | POA: Diagnosis not present

## 2023-07-01 DIAGNOSIS — I5022 Chronic systolic (congestive) heart failure: Secondary | ICD-10-CM | POA: Diagnosis not present

## 2023-07-01 DIAGNOSIS — E785 Hyperlipidemia, unspecified: Secondary | ICD-10-CM | POA: Diagnosis not present

## 2023-07-01 DIAGNOSIS — R54 Age-related physical debility: Secondary | ICD-10-CM | POA: Diagnosis not present

## 2023-07-01 DIAGNOSIS — I480 Paroxysmal atrial fibrillation: Secondary | ICD-10-CM | POA: Diagnosis not present

## 2023-07-07 DIAGNOSIS — Z7901 Long term (current) use of anticoagulants: Secondary | ICD-10-CM | POA: Diagnosis not present

## 2023-07-07 DIAGNOSIS — I509 Heart failure, unspecified: Secondary | ICD-10-CM | POA: Diagnosis not present

## 2023-07-07 DIAGNOSIS — I480 Paroxysmal atrial fibrillation: Secondary | ICD-10-CM | POA: Diagnosis not present

## 2023-07-07 DIAGNOSIS — D6869 Other thrombophilia: Secondary | ICD-10-CM | POA: Diagnosis not present

## 2023-07-07 DIAGNOSIS — I110 Hypertensive heart disease with heart failure: Secondary | ICD-10-CM | POA: Diagnosis not present

## 2023-07-07 DIAGNOSIS — Z515 Encounter for palliative care: Secondary | ICD-10-CM | POA: Diagnosis not present

## 2023-07-07 DIAGNOSIS — Z6841 Body Mass Index (BMI) 40.0 and over, adult: Secondary | ICD-10-CM | POA: Diagnosis not present

## 2023-07-08 ENCOUNTER — Other Ambulatory Visit: Payer: PPO

## 2023-07-08 ENCOUNTER — Other Ambulatory Visit: Payer: Self-pay | Admitting: Emergency Medicine

## 2023-07-08 DIAGNOSIS — R918 Other nonspecific abnormal finding of lung field: Secondary | ICD-10-CM

## 2023-07-09 DIAGNOSIS — J449 Chronic obstructive pulmonary disease, unspecified: Secondary | ICD-10-CM | POA: Diagnosis not present

## 2023-07-09 DIAGNOSIS — I5032 Chronic diastolic (congestive) heart failure: Secondary | ICD-10-CM | POA: Diagnosis not present

## 2023-07-09 DIAGNOSIS — M6281 Muscle weakness (generalized): Secondary | ICD-10-CM | POA: Diagnosis not present

## 2023-07-09 DIAGNOSIS — I5043 Acute on chronic combined systolic (congestive) and diastolic (congestive) heart failure: Secondary | ICD-10-CM | POA: Diagnosis not present

## 2023-07-09 DIAGNOSIS — I5023 Acute on chronic systolic (congestive) heart failure: Secondary | ICD-10-CM | POA: Diagnosis not present

## 2023-07-09 DIAGNOSIS — M1711 Unilateral primary osteoarthritis, right knee: Secondary | ICD-10-CM | POA: Diagnosis not present

## 2023-07-09 DIAGNOSIS — S2232XS Fracture of one rib, left side, sequela: Secondary | ICD-10-CM | POA: Diagnosis not present

## 2023-07-13 ENCOUNTER — Ambulatory Visit: Payer: PPO | Admitting: Emergency Medicine

## 2023-07-19 ENCOUNTER — Ambulatory Visit: Payer: PPO | Attending: Cardiovascular Disease | Admitting: *Deleted

## 2023-07-19 DIAGNOSIS — Z5181 Encounter for therapeutic drug level monitoring: Secondary | ICD-10-CM

## 2023-07-19 DIAGNOSIS — Z7901 Long term (current) use of anticoagulants: Secondary | ICD-10-CM | POA: Diagnosis not present

## 2023-07-19 DIAGNOSIS — E139 Other specified diabetes mellitus without complications: Secondary | ICD-10-CM | POA: Diagnosis not present

## 2023-07-19 DIAGNOSIS — I480 Paroxysmal atrial fibrillation: Secondary | ICD-10-CM | POA: Diagnosis not present

## 2023-07-19 LAB — POCT INR: INR: 1.7 — AB (ref 2.0–3.0)

## 2023-07-19 NOTE — Patient Instructions (Signed)
Description   TAKE 2 TABLETS TODAY ONLY THEN START taking warfarin 1 tablet daily except for 1/2 tablet on Monday and Friday. Recheck INR in 3 weeks Coumadin Clinic [phone]

## 2023-07-26 ENCOUNTER — Emergency Department (HOSPITAL_COMMUNITY): Payer: PPO

## 2023-07-26 ENCOUNTER — Emergency Department (HOSPITAL_COMMUNITY)
Admission: EM | Admit: 2023-07-26 | Discharge: 2023-07-26 | Disposition: A | Discharge status: Home / Self Care | Payer: PPO | Attending: Emergency Medicine | Admitting: Emergency Medicine

## 2023-07-26 ENCOUNTER — Encounter (HOSPITAL_COMMUNITY): Payer: Self-pay

## 2023-07-26 ENCOUNTER — Other Ambulatory Visit: Payer: PPO

## 2023-07-26 ENCOUNTER — Other Ambulatory Visit: Payer: Self-pay

## 2023-07-26 DIAGNOSIS — I10 Essential (primary) hypertension: Secondary | ICD-10-CM | POA: Insufficient documentation

## 2023-07-26 DIAGNOSIS — R1013 Epigastric pain: Secondary | ICD-10-CM | POA: Diagnosis not present

## 2023-07-26 DIAGNOSIS — I4891 Unspecified atrial fibrillation: Secondary | ICD-10-CM | POA: Insufficient documentation

## 2023-07-26 DIAGNOSIS — R0689 Other abnormalities of breathing: Secondary | ICD-10-CM | POA: Diagnosis not present

## 2023-07-26 DIAGNOSIS — Z79899 Other long term (current) drug therapy: Secondary | ICD-10-CM | POA: Insufficient documentation

## 2023-07-26 DIAGNOSIS — R739 Hyperglycemia, unspecified: Secondary | ICD-10-CM | POA: Diagnosis not present

## 2023-07-26 DIAGNOSIS — I700 Atherosclerosis of aorta: Secondary | ICD-10-CM | POA: Diagnosis not present

## 2023-07-26 DIAGNOSIS — Z7901 Long term (current) use of anticoagulants: Secondary | ICD-10-CM | POA: Insufficient documentation

## 2023-07-26 DIAGNOSIS — R112 Nausea with vomiting, unspecified: Secondary | ICD-10-CM | POA: Diagnosis not present

## 2023-07-26 DIAGNOSIS — Z9049 Acquired absence of other specified parts of digestive tract: Secondary | ICD-10-CM | POA: Diagnosis not present

## 2023-07-26 DIAGNOSIS — R1111 Vomiting without nausea: Secondary | ICD-10-CM | POA: Diagnosis not present

## 2023-07-26 DIAGNOSIS — Z794 Long term (current) use of insulin: Secondary | ICD-10-CM | POA: Insufficient documentation

## 2023-07-26 DIAGNOSIS — R1114 Bilious vomiting: Secondary | ICD-10-CM | POA: Diagnosis not present

## 2023-07-26 DIAGNOSIS — K449 Diaphragmatic hernia without obstruction or gangrene: Secondary | ICD-10-CM | POA: Insufficient documentation

## 2023-07-26 LAB — URINALYSIS, W/ REFLEX TO CULTURE (INFECTION SUSPECTED)
Bacteria, UA: NONE SEEN
Bilirubin Urine: NEGATIVE
Glucose, UA: >=500 mg/dL — AB
Ketones, ur: 5 mg/dL — AB
Leukocytes,Ua: NEGATIVE
Nitrite: NEGATIVE
Protein, ur: 100 mg/dL — AB
Specific Gravity, Urine: 1.012 (ref 1.005–1.030)
pH: 7 (ref 5.0–8.0)

## 2023-07-26 LAB — CBC WITH DIFFERENTIAL/PLATELET
Abs Immature Granulocytes: 0.03 10*3/uL (ref 0.00–0.07)
Basophils Absolute: 0.1 10*3/uL (ref 0.0–0.1)
Basophils Relative: 1 %
Eosinophils Absolute: 0 10*3/uL (ref 0.0–0.5)
Eosinophils Relative: 0 %
HCT: 36.7 % (ref 36.0–46.0)
Hemoglobin: 12.2 g/dL (ref 12.0–15.0)
Immature Granulocytes: 0 %
Lymphocytes Relative: 12 %
Lymphs Abs: 1.1 10*3/uL (ref 0.7–4.0)
MCH: 28.6 pg (ref 26.0–34.0)
MCHC: 33.2 g/dL (ref 30.0–36.0)
MCV: 86.2 fL (ref 80.0–100.0)
Monocytes Absolute: 0.7 10*3/uL (ref 0.1–1.0)
Monocytes Relative: 8 %
Neutro Abs: 7.2 10*3/uL (ref 1.7–7.7)
Neutrophils Relative %: 79 %
Platelets: 223 10*3/uL (ref 150–400)
RBC: 4.26 MIL/uL (ref 3.87–5.11)
RDW: 12.3 % (ref 11.5–15.5)
WBC: 9.2 10*3/uL (ref 4.0–10.5)
nRBC: 0 % (ref 0.0–0.2)

## 2023-07-26 LAB — COMPREHENSIVE METABOLIC PANEL
ALT: 17 U/L (ref 0–44)
AST: 18 U/L (ref 15–41)
Albumin: 3.3 g/dL — ABNORMAL LOW (ref 3.5–5.0)
Alkaline Phosphatase: 92 U/L (ref 38–126)
Anion gap: 11 (ref 5–15)
BUN: 10 mg/dL (ref 8–23)
CO2: 27 mmol/L (ref 22–32)
Calcium: 9.7 mg/dL (ref 8.9–10.3)
Chloride: 95 mmol/L — ABNORMAL LOW (ref 98–111)
Creatinine, Ser: 0.98 mg/dL (ref 0.44–1.00)
GFR, Estimated: >60 mL/min (ref >60)
Glucose, Bld: 287 mg/dL — ABNORMAL HIGH (ref 70–99)
Potassium: 3.7 mmol/L (ref 3.5–5.1)
Sodium: 133 mmol/L — ABNORMAL LOW (ref 135–145)
Total Bilirubin: 0.5 mg/dL (ref 0.3–1.2)
Total Protein: 7.7 g/dL (ref 6.5–8.1)

## 2023-07-26 LAB — TROPONIN I (HIGH SENSITIVITY): Troponin I (High Sensitivity): 10 ng/L (ref <18)

## 2023-07-26 LAB — LIPASE, BLOOD: Lipase: 48 U/L (ref 11–51)

## 2023-07-26 LAB — PROTIME-INR
INR: 2.4 — ABNORMAL HIGH (ref 0.8–1.2)
Prothrombin Time: 26.6 s — ABNORMAL HIGH (ref 11.4–15.2)

## 2023-07-26 LAB — ETHANOL: Alcohol, Ethyl (B): <10 mg/dL (ref <10)

## 2023-07-26 MED ORDER — FENTANYL CITRATE PF 50 MCG/ML IJ SOSY
25.0000 ug | PREFILLED_SYRINGE | Freq: Once | INTRAMUSCULAR | Status: AC
  Administered 2023-07-26: 25 ug via INTRAVENOUS
  Filled 2023-07-26: qty 1

## 2023-07-26 MED ORDER — FAMOTIDINE IN NACL 20-0.9 MG/50ML-% IV SOLN
20.0000 mg | Freq: Once | INTRAVENOUS | Status: AC
  Administered 2023-07-26: 20 mg via INTRAVENOUS
  Filled 2023-07-26: qty 50

## 2023-07-26 MED ORDER — SUCRALFATE 1 GM/10ML PO SUSP
1.0000 g | Freq: Once | ORAL | Status: AC
  Administered 2023-07-26: 1 g via ORAL
  Filled 2023-07-26: qty 10

## 2023-07-26 MED ORDER — LACTATED RINGERS IV BOLUS
1000.0000 mL | Freq: Once | INTRAVENOUS | Status: AC
  Administered 2023-07-26: 1000 mL via INTRAVENOUS

## 2023-07-26 MED ORDER — SUCRALFATE 1 G PO TABS: 1.0000 g | ORAL_TABLET | Freq: Three times a day (TID) | ORAL | 0 refills | Status: DC

## 2023-07-26 MED ORDER — IOHEXOL 350 MG/ML SOLN
75.0000 mL | Freq: Once | INTRAVENOUS | Status: AC | PRN
  Administered 2023-07-26: 75 mL via INTRAVENOUS

## 2023-07-26 MED ORDER — METOCLOPRAMIDE HCL 5 MG/ML IJ SOLN
10.0000 mg | Freq: Once | INTRAMUSCULAR | Status: AC
  Administered 2023-07-26: 10 mg via INTRAVENOUS
  Filled 2023-07-26: qty 2

## 2023-07-26 NOTE — ED Provider Notes (Signed)
[place] EMERGENCY DEPARTMENT AT [facility] Provider Note   CSN: [id number] Arrival date & time: 07/26/23  4098     History  Chief Complaint  Patient presents with   Emesis    [name] is a 72 y.o. female.  HPI Patient presents with nausea, vomiting, epigastric pain.  She acknowledges multiple medical problems, denies any obvious precipitating events.  Over the past 2 days she has had persistent nausea, vomiting, anorexia as well as epigastric discomfort.  Note no upper chest pain, no lower abdominal pain, no lateral or flank pain.  No fever, confusion, syncope.  She is able to take her home medications yesterday but not today.  EMS reports patient was hypertensive, 200 systolic, heart rate 110.  She has known A-fib.    Home Medications Prior to Admission medications   Medication Sig Start Date End Date Taking? Authorizing Provider  sucralfate (CARAFATE) 1 g tablet Take 1 tablet (1 g total) by mouth 4 (four) times daily -  with meals and at bedtime. 07/26/23  Yes Gerhard Munch, MD  acetaminophen (TYLENOL) 325 MG tablet Take 2 tablets (650 mg total) by mouth every 6 (six) hours as needed for mild pain (or Fever >/= 101). 04/12/23   Sheikh, Omair Latif, DO  Blood Glucose Monitoring Suppl (ONETOUCH VERIO FLEX SYSTEM) w/Device KIT  07/23/20   [provider]  Cholecalciferol (VITAMIN D3) 50 MCG (2000 UT) TABS Take 2,000 Units by mouth daily.    [provider]  diclofenac Sodium (VOLTAREN) 1 % GEL Apply 2 g topically 4 (four) times daily. Right knee 10/08/22   Arrien, York Ram, MD  furosemide (LASIX) 40 MG tablet Take 1 tablet (40 mg total) by mouth 3 (three) times a week. TAKE Monday Wednesday AND FRIDAY Patient taking differently: Take 40 mg by mouth every Monday, Wednesday, and Friday. 08/11/22   Croitoru, Mihai, MD  insulin NPH-regular Human (70-30) 100 UNIT/ML injection Inject 20 Units into the skin 2 (two) times daily with a meal.  10/08/22 11/07/22  Arrien, York Ram, MD  Lancets [facility] DELICA PLUS [place]) MISC Apply topically. 08/13/20   [provider]  lisinopril (ZESTRIL) 10 MG tablet Take 1 tablet (10 mg total) by mouth daily. 04/12/23 04/11/24  Marguerita Merles Latif, DO  metoprolol succinate (TOPROL-XL) 50 MG 24 hr tablet TAKE 1 TABLET BY MOUTH DAILY. TAKE WITH OR IMMEDIATELY FOLLOWING A MEAL. 03/23/23   Carlos Levering, NP  metoprolol tartrate (LOPRESSOR) 25 MG tablet Take 1 table as needed for heart rate above 120bpm. 03/30/23   Wittenborn, Gavin Pound, NP  ONETOUCH VERIO test strip 1 each 2 (two) times daily. 10/11/20   [provider]  oxyCODONE-acetaminophen (PERCOCET/ROXICET) 5-325 MG tablet Take 1 tablet by mouth every 6 (six) hours as needed for severe pain. 10/08/22   Arrien, York Ram, MD  pantoprazole (PROTONIX) 40 MG tablet Take 1 tablet (40 mg total) by mouth daily. Patient taking differently: Take 40 mg by mouth every evening. 06/24/22   Croitoru, Mihai, MD  polyethylene glycol (MIRALAX / GLYCOLAX) 17 g packet Take 17 g by mouth daily. 10/08/22   Arrien, York Ram, MD  rosuvastatin (CRESTOR) 40 MG tablet Take 40 mg by mouth daily.    [provider]  Semaglutide,0.25 or 0.5MG /DOS, (OZEMPIC, 0.25 OR 0.5 MG/DOSE,) 2 MG/1.5ML SOPN Inject into the skin.    [provider]  spironolactone (ALDACTONE) 25 MG tablet Take 1 tablet (25 mg total) by mouth 3 (three) times a week. TAKE Monday  Wednesday AND Friday Patient taking differently: Take 25 mg by mouth every Monday, Wednesday, and Friday. 08/11/22   Croitoru, Mihai, MD  warfarin (COUMADIN) 7.5 MG tablet TAKE 1/2 TABLET TO 1 TABLET BY MOUTH DAILY AS DIRECTED BY COUMADIN CLINIC 04/12/23   Croitoru, Mihai, MD      Allergies    Levofloxacin and Morphine and codeine    Review of Systems   Review of Systems  Physical Exam Updated Vital Signs BP (!) 158/70   Pulse (!) 110   Temp 98.5 F (36.9 C) (Oral)   Resp  (!) 24   SpO2 97%  Physical Exam Vitals and nursing note reviewed.  Constitutional:      Appearance: She is well-developed. She is obese.  HENT:     Head: Normocephalic and atraumatic.  Eyes:     Conjunctiva/sclera: Conjunctivae normal.  Cardiovascular:     Rate and Rhythm: Tachycardia present. Rhythm irregularly irregular.  Pulmonary:     Effort: Pulmonary effort is normal. Tachypnea present. No respiratory distress.     Breath sounds: Normal breath sounds. No stridor.  Abdominal:     General: There is no distension.     Tenderness: There is abdominal tenderness in the epigastric area.  Skin:    General: Skin is warm and dry.  Neurological:     Mental Status: She is alert and oriented to person, place, and time.     Cranial Nerves: No cranial nerve deficit.  Psychiatric:        Mood and Affect: Mood normal.     ED Results / Procedures / Treatments   Labs (all labs ordered are listed, but only abnormal results are displayed) Labs Reviewed  COMPREHENSIVE METABOLIC PANEL - Abnormal; Notable for the following components:      Result Value   Sodium 133 (*)    Chloride 95 (*)    Glucose, Bld 287 (*)    Albumin 3.3 (*)    All other components within normal limits  URINALYSIS, W/ REFLEX TO CULTURE (INFECTION SUSPECTED) - Abnormal; Notable for the following components:   Glucose, UA >=500 (*)    Hgb urine dipstick MODERATE (*)    Ketones, ur 5 (*)    Protein, ur 100 (*)    All other components within normal limits  PROTIME-INR - Abnormal; Notable for the following components:   Prothrombin Time 26.6 (*)    INR 2.4 (*)    All other components within normal limits  ETHANOL  LIPASE, BLOOD  CBC WITH DIFFERENTIAL/PLATELET  TROPONIN I (HIGH SENSITIVITY)    EKG None  Radiology CT ABDOMEN PELVIS W CONTRAST  Result Date: 07/26/2023 CLINICAL DATA:  Vomiting.  Bowel obstruction suspected. EXAM: CT ABDOMEN AND PELVIS WITH CONTRAST TECHNIQUE: Multidetector CT imaging of the  abdomen and pelvis was performed using the standard protocol following bolus administration of intravenous contrast. RADIATION DOSE REDUCTION: This exam was performed according to the departmental dose-optimization program which includes automated exposure control, adjustment of the mA and/or kV according to patient size and/or use of iterative reconstruction technique. CONTRAST:  75mL OMNIPAQUE IOHEXOL 350 MG/ML SOLN COMPARISON:  04/09/2023 FINDINGS: Lower chest: No acute findings.  Small hiatal hernia. Hepatobiliary: No focal liver abnormality is seen. Status post cholecystectomy. No biliary dilatation. Pancreas: No focal abnormality or ductal dilatation. Spleen: No focal abnormality.  Normal size. Adrenals/Urinary Tract: No adrenal abnormality. No focal renal abnormality. No stones or hydronephrosis. Urinary bladder is unremarkable. Stomach/Bowel: Normal appendix. Stomach, large and small bowel grossly unremarkable. No  bowel obstruction. Vascular/Lymphatic: Diffuse aortoiliac atherosclerosis. No evidence of aneurysm or adenopathy. Reproductive: Prior hysterectomy.  No adnexal masses. Other: No free fluid or free air. Musculoskeletal: No acute bony abnormality. IMPRESSION: No evidence of bowel obstruction. Small hiatal hernia. Aortic atherosclerosis. Electronically Signed   By: Charlett Nose M.D.   On: 07/26/2023 13:15    Procedures Procedures    Medications Ordered in ED Medications  famotidine (PEPCID) IVPB 20 mg premix (0 mg Intravenous Stopped 07/26/23 0824)  sucralfate (CARAFATE) 1 GM/10ML suspension 1 g (1 g Oral Given 07/26/23 0753)  fentaNYL (SUBLIMAZE) injection 25 mcg (25 mcg Intravenous Given 07/26/23 1010)  lactated ringers bolus 1,000 mL (0 mLs Intravenous Stopped 07/26/23 1228)  metoCLOPramide (REGLAN) injection 10 mg (10 mg Intravenous Given 07/26/23 1128)  iohexol (OMNIPAQUE) 350 MG/ML injection 75 mL (75 mLs Intravenous Contrast Given 07/26/23 1212)    ED Course/ Medical Decision  Making/ A&P                                 Medical Decision Making Adult female with multiple medical issues including obesity, A-fib, prior episodes of nausea, vomiting leading to AKI now presents with nausea, vomiting, epigastric pain.  As above history is notable for similar episodes.  Concern for gastritis, less likely ACS, with consideration of electrolyte disturbances, dehydration.  Abdomen is nonperitoneal, but with tachycardia, tachypnea, consideration of bacteremia, sepsis also warranted. Cardiac 110 A-fib abnormal Pulse ox 100% room air normal   Amount and/or Complexity of Data Reviewed Independent Historian: EMS    Details: EMS rhythm strip sinus tach, 105 intraventricular conduction delay abnormal with period of A-fib. External Data Reviewed: notes.    Details: Hospital course from recent admission reviewed Labs: ordered. Decision-making details documented in ED Course. Radiology: ordered. ECG/medicine tests: ordered and independent interpretation performed. Decision-making details documented in ED Course.  Risk Prescription drug management.   Update: I discussed the patient's presentation with her son via telephone.   3:57 PM Patient awake, alert, sitting upright eating a sandwich.  She does have some mild ongoing discomfort, but is tolerant of p.o., and with reassuring labs as above, CT without evidence for obstruction or other acute findings, but with recognition of hiatal hernia some suspicion for this versus gastroesophagitis or gastroparesis contributing to her presentation.  Given p.o. tolerance, after fluid resuscitation here, patient will follow-up closely with her GI team.  Patient will continue Protonix, though she is also on Coumadin, her INR level is appropriate.  She will discuss this with her physician.         Final Clinical Impression(s) / ED Diagnoses Final diagnoses:  Epigastric pain  Bilious vomiting with nausea    Rx / DC Orders ED  Discharge Orders          Ordered    sucralfate (CARAFATE) 1 g tablet  3 times daily with meals & bedtime       Note to Pharmacy: Take for one week   07/26/23 1556              Gerhard Munch, MD 07/26/23 1557

## 2023-07-26 NOTE — ED Triage Notes (Signed)
PT BIB EMS for nausea and vomiting for 3 days, patient states she has been having foul smelling urine for the past few days.  200/90 HR 110 AFIB Warfarin CBG 292 98 % RA Cap 35 98.5

## 2023-07-26 NOTE — Discharge Instructions (Addendum)
Today's evaluation has been generally reassuring.  Please continue taking all your previously prescribed medications and the newly prescribed Carafate.  Follow-up with your gastroenterologist and your primary care physician.  In particular, with your physicians discuss your medication regimen.  Return here for concerning changes in your condition.

## 2023-07-28 DIAGNOSIS — M6281 Muscle weakness (generalized): Secondary | ICD-10-CM | POA: Diagnosis not present

## 2023-07-28 DIAGNOSIS — I2510 Atherosclerotic heart disease of native coronary artery without angina pectoris: Secondary | ICD-10-CM | POA: Diagnosis not present

## 2023-07-28 DIAGNOSIS — I5043 Acute on chronic combined systolic (congestive) and diastolic (congestive) heart failure: Secondary | ICD-10-CM | POA: Diagnosis not present

## 2023-07-28 DIAGNOSIS — I480 Paroxysmal atrial fibrillation: Secondary | ICD-10-CM | POA: Diagnosis not present

## 2023-07-28 DIAGNOSIS — I10 Essential (primary) hypertension: Secondary | ICD-10-CM | POA: Diagnosis not present

## 2023-07-28 DIAGNOSIS — M1711 Unilateral primary osteoarthritis, right knee: Secondary | ICD-10-CM | POA: Diagnosis not present

## 2023-07-28 DIAGNOSIS — E785 Hyperlipidemia, unspecified: Secondary | ICD-10-CM | POA: Diagnosis not present

## 2023-07-28 DIAGNOSIS — J449 Chronic obstructive pulmonary disease, unspecified: Secondary | ICD-10-CM | POA: Diagnosis not present

## 2023-07-28 DIAGNOSIS — I5023 Acute on chronic systolic (congestive) heart failure: Secondary | ICD-10-CM | POA: Diagnosis not present

## 2023-07-28 DIAGNOSIS — S2232XS Fracture of one rib, left side, sequela: Secondary | ICD-10-CM | POA: Diagnosis not present

## 2023-07-28 DIAGNOSIS — R54 Age-related physical debility: Secondary | ICD-10-CM | POA: Diagnosis not present

## 2023-07-28 DIAGNOSIS — E1169 Type 2 diabetes mellitus with other specified complication: Secondary | ICD-10-CM | POA: Diagnosis not present

## 2023-07-28 DIAGNOSIS — I5032 Chronic diastolic (congestive) heart failure: Secondary | ICD-10-CM | POA: Diagnosis not present

## 2023-08-01 DIAGNOSIS — B029 Zoster without complications: Secondary | ICD-10-CM | POA: Diagnosis not present

## 2023-08-01 DIAGNOSIS — I5032 Chronic diastolic (congestive) heart failure: Secondary | ICD-10-CM | POA: Diagnosis not present

## 2023-08-01 DIAGNOSIS — E785 Hyperlipidemia, unspecified: Secondary | ICD-10-CM | POA: Diagnosis not present

## 2023-08-01 DIAGNOSIS — E1169 Type 2 diabetes mellitus with other specified complication: Secondary | ICD-10-CM | POA: Diagnosis not present

## 2023-08-01 DIAGNOSIS — K2100 Gastro-esophageal reflux disease with esophagitis, without bleeding: Secondary | ICD-10-CM | POA: Diagnosis not present

## 2023-08-09 ENCOUNTER — Ambulatory Visit: Payer: PPO | Attending: Cardiovascular Disease

## 2023-08-09 DIAGNOSIS — J449 Chronic obstructive pulmonary disease, unspecified: Secondary | ICD-10-CM | POA: Diagnosis not present

## 2023-08-09 DIAGNOSIS — I5032 Chronic diastolic (congestive) heart failure: Secondary | ICD-10-CM | POA: Diagnosis not present

## 2023-08-09 DIAGNOSIS — B029 Zoster without complications: Secondary | ICD-10-CM | POA: Diagnosis not present

## 2023-08-09 DIAGNOSIS — I480 Paroxysmal atrial fibrillation: Secondary | ICD-10-CM | POA: Diagnosis not present

## 2023-08-09 DIAGNOSIS — Z7901 Long term (current) use of anticoagulants: Secondary | ICD-10-CM

## 2023-08-09 DIAGNOSIS — M1711 Unilateral primary osteoarthritis, right knee: Secondary | ICD-10-CM | POA: Diagnosis not present

## 2023-08-09 DIAGNOSIS — K5900 Constipation, unspecified: Secondary | ICD-10-CM | POA: Diagnosis not present

## 2023-08-09 DIAGNOSIS — S2232XS Fracture of one rib, left side, sequela: Secondary | ICD-10-CM | POA: Diagnosis not present

## 2023-08-09 DIAGNOSIS — I5023 Acute on chronic systolic (congestive) heart failure: Secondary | ICD-10-CM | POA: Diagnosis not present

## 2023-08-09 DIAGNOSIS — K2100 Gastro-esophageal reflux disease with esophagitis, without bleeding: Secondary | ICD-10-CM | POA: Diagnosis not present

## 2023-08-09 DIAGNOSIS — M6281 Muscle weakness (generalized): Secondary | ICD-10-CM | POA: Diagnosis not present

## 2023-08-09 DIAGNOSIS — I5043 Acute on chronic combined systolic (congestive) and diastolic (congestive) heart failure: Secondary | ICD-10-CM | POA: Diagnosis not present

## 2023-08-09 LAB — POCT INR: INR: 3.5 — AB (ref 2.0–3.0)

## 2023-08-09 NOTE — Patient Instructions (Addendum)
HOLD TODAY ONLY THEN Continue taking warfarin 1 tablet daily except for 1/2 tablet on Monday and Friday. Recheck INR in 2 weeks Coumadin Clinic [phone]

## 2023-08-17 DIAGNOSIS — E1165 Type 2 diabetes mellitus with hyperglycemia: Secondary | ICD-10-CM | POA: Diagnosis not present

## 2023-08-17 DIAGNOSIS — K219 Gastro-esophageal reflux disease without esophagitis: Secondary | ICD-10-CM | POA: Diagnosis not present

## 2023-08-17 DIAGNOSIS — M25511 Pain in right shoulder: Secondary | ICD-10-CM | POA: Diagnosis not present

## 2023-08-19 ENCOUNTER — Ambulatory Visit
Admission: RE | Admit: 2023-08-19 | Discharge: 2023-08-19 | Disposition: A | Discharge status: Home / Self Care | Payer: PPO | Source: Ambulatory Visit | Attending: Emergency Medicine | Admitting: Emergency Medicine

## 2023-08-19 DIAGNOSIS — I700 Atherosclerosis of aorta: Secondary | ICD-10-CM | POA: Diagnosis not present

## 2023-08-19 DIAGNOSIS — R918 Other nonspecific abnormal finding of lung field: Secondary | ICD-10-CM

## 2023-08-19 DIAGNOSIS — I2510 Atherosclerotic heart disease of native coronary artery without angina pectoris: Secondary | ICD-10-CM | POA: Diagnosis not present

## 2023-08-19 DIAGNOSIS — K449 Diaphragmatic hernia without obstruction or gangrene: Secondary | ICD-10-CM | POA: Diagnosis not present

## 2023-08-19 DIAGNOSIS — R911 Solitary pulmonary nodule: Secondary | ICD-10-CM | POA: Diagnosis not present

## 2023-08-19 DIAGNOSIS — I288 Other diseases of pulmonary vessels: Secondary | ICD-10-CM | POA: Diagnosis not present

## 2023-08-19 DIAGNOSIS — Z85118 Personal history of other malignant neoplasm of bronchus and lung: Secondary | ICD-10-CM | POA: Diagnosis not present

## 2023-08-22 ENCOUNTER — Telehealth: Payer: Self-pay | Admitting: Emergency Medicine

## 2023-08-22 NOTE — Telephone Encounter (Signed)
Company Dropped off the CD. Will put in Dr. Kavin Leech box.

## 2023-08-23 ENCOUNTER — Ambulatory Visit: Payer: PPO | Attending: Cardiovascular Disease | Admitting: Cardiovascular Disease

## 2023-08-23 ENCOUNTER — Encounter: Payer: Self-pay | Admitting: Cardiovascular Disease

## 2023-08-23 ENCOUNTER — Ambulatory Visit (INDEPENDENT_AMBULATORY_CARE_PROVIDER_SITE_OTHER): Payer: PPO | Admitting: *Deleted

## 2023-08-23 VITALS — BP 134/74 | HR 82 | Ht 63.0 in | Wt 264.8 lb

## 2023-08-23 DIAGNOSIS — Z79899 Other long term (current) drug therapy: Secondary | ICD-10-CM | POA: Diagnosis not present

## 2023-08-23 DIAGNOSIS — I480 Paroxysmal atrial fibrillation: Secondary | ICD-10-CM

## 2023-08-23 DIAGNOSIS — D6869 Other thrombophilia: Secondary | ICD-10-CM | POA: Diagnosis not present

## 2023-08-23 DIAGNOSIS — E1151 Type 2 diabetes mellitus with diabetic peripheral angiopathy without gangrene: Secondary | ICD-10-CM | POA: Diagnosis not present

## 2023-08-23 DIAGNOSIS — Z7901 Long term (current) use of anticoagulants: Secondary | ICD-10-CM

## 2023-08-23 DIAGNOSIS — I5033 Acute on chronic diastolic (congestive) heart failure: Secondary | ICD-10-CM | POA: Diagnosis not present

## 2023-08-23 DIAGNOSIS — I10 Essential (primary) hypertension: Secondary | ICD-10-CM

## 2023-08-23 DIAGNOSIS — I5032 Chronic diastolic (congestive) heart failure: Secondary | ICD-10-CM

## 2023-08-23 DIAGNOSIS — I2510 Atherosclerotic heart disease of native coronary artery without angina pectoris: Secondary | ICD-10-CM | POA: Diagnosis not present

## 2023-08-23 LAB — POCT INR: INR: 3.8 — AB (ref 2.0–3.0)

## 2023-08-23 MED ORDER — FUROSEMIDE 40 MG PO TABS: ORAL_TABLET | ORAL | 3 refills | Status: DC

## 2023-08-23 MED ORDER — SPIRONOLACTONE 25 MG PO TABS: 25.0000 mg | ORAL_TABLET | Freq: Every day | ORAL | 3 refills | Status: DC

## 2023-08-23 NOTE — Progress Notes (Unsigned)
Cardiology Office Note:  .   Date:  08/25/2023  ID:  [name], DOB 26-Aug-1951, MRN [id number] PCP: Georgianne Fick, MD  [facility] Providers Cardiologist:  Thurmon Fair, MD    History of Present Illness: .   MILCA MCERLEAN is a 72 y.o. female with history of  CABG (LIMA-LAD 11/18), PAD, chronic diastolic CHF, and paroxysmal atrial fibrillation (TEE-cardioversion October 2022), type 2 diabetes on insulin, peripheral venous insufficiency with history of venous ulcers, bilateral knee degenerative joint disease with limited mobility and morbid obesity.   Since her last appointment just to months ago she has gained about 20 pounds.  She has not been taking any furosemide or spironolactone, since it did not seem to help her edema.  She has right flank discomfort and early satiety.  Her INR is unexpectedly high despite no change in diet or medications.  She is very sedentary due to her knee problems and has not noticed much worsening of her shortness of breath.  She was hospitalized with protracted nausea and vomiting complicated by acute kidney injury in July when her creatinine peaked at 2.58.  After appropriate rehydration at discharge she weighed 110.8 kg (roughly 244 pounds, which was also her weight at her last office appointment in September).  ROS: The patient specifically denies any chest pain at rest or with exertion, dyspnea at rest or with exertion, orthopnea, paroxysmal nocturnal dyspnea, syncope, palpitations, focal neurological deficits, intermittent claudication, unexplained weight gain, cough, hemoptysis or wheezing. Her leg wounds have healed.  Studies Reviewed: Marland Kitchen        ECG: Ordered 04/09/2023 and personally reviewed shows sinus rhythm with 1st deg AV block, atypical right bundle branch block, QTc prolonged at 511 ms   Echo 07/15/2021 1. Left ventricular ejection fraction, by estimation, is 55 to 60%. The  left ventricle has normal function. The left  ventricle has no regional  wall motion abnormalities. There is moderate asymmetric left ventricular  hypertrophy of the septal segment.  Left ventricular diastolic parameters are indeterminate.   2. Right ventricular systolic function is mildly reduced. The right  ventricular size is mildly enlarged.   3. Right atrial size was mildly dilated.   4. The mitral valve is normal in structure. No evidence of mitral valve  regurgitation. No evidence of mitral stenosis.   5. Tricuspid valve regurgitation is moderate.   6. The aortic valve is normal in structure. Aortic valve regurgitation is  not visualized. Mild aortic valve sclerosis is present, with no evidence  of aortic valve stenosis.   7. The inferior vena cava is normal in size with greater than 50%  respiratory variability, suggesting right atrial pressure of 3 mmHg.    TEE with cardioversion  1. Left ventricular ejection fraction, by estimation, is 55 to 60%. The  left ventricle has normal function.   2. Right ventricular systolic function is normal. The right ventricular  size is normal.   3. Left atrial size was moderately dilated. No left atrial/left atrial  appendage thrombus was detected.   4. The mitral valve is normal in structure. Trivial mitral valve  regurgitation.   5. Tricuspid valve regurgitation is moderate.   6. The aortic valve is tricuspid. Aortic valve regurgitation is not  visualized.   7. Limited study performed: history of esophageal stricture s/p prior  dilation. Slight resistance when attempting gastric imaging so this was  deferred.    ;;cardioverted x 1 with 200J of biphasic synchronized energy.  The patient converted  to NSR;; Risk Assessment/Calculations:    CHA2DS2-VASc Score = 6   This indicates a 9.7% annual risk of stroke. The patient's score is based upon: CHF History: 1 HTN History: 1 Diabetes History: 1 Stroke History: 0 Vascular Disease History: 1 Age Score: 1 Gender Score: 1      Physical Exam:   VS:  BP 134/74 (BP Location: Left Arm, Patient Position: Sitting, Cuff Size: Large)   Pulse 82   Ht 5\' 3"  (1.6 m)   Wt 264 lb 12.8 oz (120.1 kg)   SpO2 94%   BMI 46.91 kg/m    Wt Readings from Last 3 Encounters:  08/23/23 264 lb 12.8 oz (120.1 kg)  06/09/23 244 lb (110.7 kg)  04/12/23 244 lb 4.3 oz (110.8 kg)    GEN: Morbidly obese, well developed in no acute distress NECK: 10 cm JVD; No carotid bruits CARDIAC: Widely split S2, RRR, no murmurs, rubs, gallops RESPIRATORY:  Clear to auscultation without rales, wheezing or rhonchi  ABDOMEN: Soft, non-tender, non-distended EXTREMITIES:  3+ bilateral edema despite compression hose; No deformity   ASSESSMENT AND PLAN: .   CHF: Preserved LVEF.  Acute exacerbation due to noncompliance with diuretics, predominantly right heart failure findings.  And spironolactone in her previous doses, taken daily until her weight reaches 244 pounds, then go back to the original prescription of diuretics every other day.  Reviewed the importance of sodium restriction.  Her most recent creatinine was completely normal at 0.98, although typically she has had a creatinine in the 1.2 range when at baseline.  She did have transient AKI in July when her creatinine peaked at 2.58 when she was hospitalized with protracted nausea and vomiting. AFib: Regular rhythm today.  Chronically anticoagulated.  Has not had a clinically evident episodes since cardioversion in October 2022. Anticoagulation: Suspect her INR is higher due to passive hepatic congestion from heart failure exacerbation. CAD: Asymptomatic since bypass surgery.  Her lipids are in appropriate range.  Not on aspirin due to full anticoagulation. DM: Not ideally controlled.  Most recent hemoglobin A1c was 8.6% in July. HTN: Adequate blood pressure control.       Dispo:  Patient Instructions  Medication Instructions:  Furosemide- Take 40 mg daily until weight is 244 pounds. Once weight  reaches 244, take 40 mg every other day.  Spironolactone- Take 25 mg daily until your weight is 244 pounds. Once your weight reaches 244 pounds, then take 25 mg every other day.  *If you need a refill on your cardiac medications before your next appointment, please call your pharmacy*   Lab Work: [facility]- 09/13/23 If you have labs (blood work) drawn today and your tests are completely normal, you will receive your results only by: MyChart Message (if you have MyChart) OR A paper copy in the mail If you have any lab test that is abnormal or we need to change your treatment, we will call you to review the results.   Follow-Up: At [facility], you and your health needs are our priority.  As part of our continuing mission to provide you with exceptional heart care, we have created designated Provider Care Teams.  These Care Teams include your primary Cardiologist (physician) and Advanced Practice Providers (APPs -  Physician Assistants and Nurse Practitioners) who all work together to provide you with the care you need, when you need it.  We recommend signing up for the patient portal called "MyChart".  Sign up information is provided on this After Visit Summary.  MyChart is used to connect with patients for Virtual Visits (Telemedicine).  Patients are able to view lab/test results, encounter notes, upcoming appointments, etc.  Non-urgent messages can be sent to your provider as well.   To learn more about what you can do with MyChart, go to [email or web address].    Your next appointment:   December 17 th with an APP before or after Coumadin clinic appt.     Signed, Thurmon Fair, MD

## 2023-08-23 NOTE — Patient Instructions (Addendum)
Description   DO NOT TAKE ANY WARFARIN TODAY THEN START taking warfarin 1 tablet daily except for 1/2 tablet on Monday, Wednesday, and Friday. Recheck INR in 2 weeks Coumadin Clinic [phone]

## 2023-08-23 NOTE — Patient Instructions (Signed)
Medication Instructions:  Furosemide- Take 40 mg daily until weight is 244 pounds. Once weight reaches 244, take 40 mg every other day.  Spironolactone- Take 25 mg daily until your weight is 244 pounds. Once your weight reaches 244 pounds, then take 25 mg every other day.  *If you need a refill on your cardiac medications before your next appointment, please call your pharmacy*   Lab Work: [facility]- 09/13/23 If you have labs (blood work) drawn today and your tests are completely normal, you will receive your results only by: MyChart Message (if you have MyChart) OR A paper copy in the mail If you have any lab test that is abnormal or we need to change your treatment, we will call you to review the results.   Follow-Up: At [facility], you and your health needs are our priority.  As part of our continuing mission to provide you with exceptional heart care, we have created designated Provider Care Teams.  These Care Teams include your primary Cardiologist (physician) and Advanced Practice Providers (APPs -  Physician Assistants and Nurse Practitioners) who all work together to provide you with the care you need, when you need it.  We recommend signing up for the patient portal called "MyChart".  Sign up information is provided on this After Visit Summary.  MyChart is used to connect with patients for Virtual Visits (Telemedicine).  Patients are able to view lab/test results, encounter notes, upcoming appointments, etc.  Non-urgent messages can be sent to your provider as well.   To learn more about what you can do with MyChart, go to [email or web address].    Your next appointment:   December 17 th with an APP before or after Coumadin clinic appt.

## 2023-08-27 DIAGNOSIS — M1711 Unilateral primary osteoarthritis, right knee: Secondary | ICD-10-CM | POA: Diagnosis not present

## 2023-08-27 DIAGNOSIS — M6281 Muscle weakness (generalized): Secondary | ICD-10-CM | POA: Diagnosis not present

## 2023-08-27 DIAGNOSIS — I5043 Acute on chronic combined systolic (congestive) and diastolic (congestive) heart failure: Secondary | ICD-10-CM | POA: Diagnosis not present

## 2023-08-27 DIAGNOSIS — I5023 Acute on chronic systolic (congestive) heart failure: Secondary | ICD-10-CM | POA: Diagnosis not present

## 2023-08-27 DIAGNOSIS — S2232XS Fracture of one rib, left side, sequela: Secondary | ICD-10-CM | POA: Diagnosis not present

## 2023-08-27 DIAGNOSIS — J449 Chronic obstructive pulmonary disease, unspecified: Secondary | ICD-10-CM | POA: Diagnosis not present

## 2023-08-27 DIAGNOSIS — I5032 Chronic diastolic (congestive) heart failure: Secondary | ICD-10-CM | POA: Diagnosis not present

## 2023-08-29 ENCOUNTER — Other Ambulatory Visit: Payer: Self-pay | Admitting: Internal Medicine

## 2023-08-29 DIAGNOSIS — Z1231 Encounter for screening mammogram for malignant neoplasm of breast: Secondary | ICD-10-CM

## 2023-08-31 ENCOUNTER — Encounter: Payer: Self-pay | Admitting: Nurse Practitioner

## 2023-08-31 ENCOUNTER — Ambulatory Visit: Payer: PPO | Admitting: Nurse Practitioner

## 2023-08-31 VITALS — BP 130/64 | HR 73 | Ht 63.0 in | Wt 264.0 lb

## 2023-08-31 DIAGNOSIS — R918 Other nonspecific abnormal finding of lung field: Secondary | ICD-10-CM

## 2023-08-31 DIAGNOSIS — C3492 Malignant neoplasm of unspecified part of left bronchus or lung: Secondary | ICD-10-CM | POA: Diagnosis not present

## 2023-08-31 DIAGNOSIS — I5032 Chronic diastolic (congestive) heart failure: Secondary | ICD-10-CM

## 2023-08-31 NOTE — Progress Notes (Unsigned)
@Patient  ID: [name], female    DOB: [birth year], 72 y.o.   MRN: [id number]  Chief Complaint  Patient presents with   Follow-up    CT F/U visit    Referring provider: Georgianne Fick, MD  HPI: 72 year old female, former smoker followed for pulmonary nodules and history of NSCLC LUL s/p lobectomy. She is a patient of Dr. Kavin Leech and last seen in office 07/09/2022. Past medical history significant for CAD/CABG, GERD with hiatal hernia, a fib, OSA, insulin dependent DM, hx of NSTEMI, CHF, CKD.   TEST/EVENTS:  08/12/2017 PFT: FVC 67, FEV1 73, ratio 79, DLCOcor 61. No BD 07/15/2021 echo: EF 55-60%. RV function mildly reduced; RV mildly enlarged. RA mildly dilated. TR moderate.  07/06/2022 Super D CT chest: atherosclerosis. Enlarged pulmonic trunk. Mediastinal lymph nodes, stable. LU lobectomy. Postop changes LLL.   07/09/2022: OV with Dr. Delton Coombes. Followed for small pulmonary nodular disease. New RUL 3 mm nodule on last CT 1 year ago. Feeling quite well. Relatively inactive due to severe knee OA and knee replacement. CT chest 07/06/2022 shows stable mild mediastinal adenopathy, LUL lobectomy, no significant nodular disease although motion artifact present. Needs 1 more repeat CT in October 2024 to establish 2 years stability. If no change, would not need any further scans. Not a candidate for lung cancer screening CT.  08/31/2023: Today - follow up Patient presents today for follow up after yearly CT scan. There were no new pulmonary nodules or suspicious masses, which documents 2 years of stability. She has a history of previous LUL lobectomy and there is no evidence of recurrence in this area either. She is not having any issues with weight loss, anorexia, hemoptysis, night sweats. She's not concerned with any breathing issues or cough. She is relatively sedentary so doesn't really notice shortness of breath.  She has been having some issues with leg swelling and a 20 lb weight gain  within a month due to stopping her diuretics. She was treated for acute CHF exacerbation by cardiology 11/26 with instructions to resume spironolactone and furosemide until weight reaches 244 lb. She does still have leg swelling. Weight hasn't changed based on our scale today. She has follow up with cardiology. Denies orthopnea, CP, PND, palpitations.   Allergies  Allergen Reactions   Levofloxacin Itching   Morphine And Codeine Itching and Other (See Comments)    Pt reports oxycodone history with no allergy.    Immunization History  Administered Date(s) Administered   Fluad Quad(high Dose 65+) 07/17/2021   Influenza Split 07/08/2012   Influenza, High Dose Seasonal PF 07/16/2015, 07/06/2016, 07/28/2018, 06/28/2019   Influenza, Quadrivalent, Recombinant, Inj, Pf 08/02/2017, 07/28/2018, 05/29/2019, 07/02/2020   Influenza,inj,Quad PF,6+ Mos 05/20/2014   Influenza-Unspecified 06/27/2013   PFIZER(Purple Top)SARS-COV-2 Vaccination 12/16/2019, 01/06/2020, 08/28/2020   Pneumococcal Conjugate-13 12/23/2015   Pneumococcal-Unspecified 11/26/2015, 02/15/2017   Tdap 01/01/2013    Past Medical History:  Diagnosis Date   Arthritis    "knees" (02/05/2016)   Cancer (HCC) 5/10   LUL Vats    CHF (congestive heart failure) (HCC)    Coronary artery disease    GERD (gastroesophageal reflux disease)    tx meds   H/O hiatal hernia    History of blood transfusion "several"   last april 2015 s/p knee surgery (02/05/2016)   Hypercholesteremia    Hypertension    Lung cancer (HCC)    2010, surgery 18% left lung no radiation or chemo   Non-STEMI (non-ST elevated myocardial infarction) (HCC) 04/07/2012  See cath results below   Paroxysmal atrial fibrillation (HCC) 04/07/2012   On Warfarin   Peripheral vascular disease (HCC) 8/12   Lt SFA PTA   Presence of stent in LAD coronary artery 04/07/2012   Xience Expedition DES 2.75 mm x 18 mm (dilated to 3.0 mm)   Sleep apnea    does not wear CPAP   Type II  diabetes mellitus (HCC)    insulin dependent    Tobacco History: Social History   Tobacco Use  Smoking Status Former   Current packs/day: 0.00   Average packs/day: 1 pack/day for 20.0 years (20.0 ttl pk-yrs)   Types: Cigarettes   Start date: 09/27/1964   Quit date: 09/27/1984   Years since quitting: 38.9   Passive exposure: Never  Smokeless Tobacco Never   Counseling given: Not Answered   Outpatient Medications Prior to Visit  Medication Sig Dispense Refill   acetaminophen (TYLENOL) 325 MG tablet Take 2 tablets (650 mg total) by mouth every 6 (six) hours as needed for mild pain (or Fever >/= 101). 20 tablet 0   Blood Glucose Monitoring Suppl (ONETOUCH VERIO FLEX SYSTEM) w/Device KIT      Cholecalciferol (VITAMIN D3) 50 MCG (2000 UT) TABS Take 2,000 Units by mouth daily.     diclofenac Sodium (VOLTAREN) 1 % GEL Apply 2 g topically 4 (four) times daily. Right knee 50 g 0   furosemide (LASIX) 40 MG tablet Take 40 mg daily until weight is 244 pounds. Once weight reaches 244, take 40 mg every other day. 90 tablet 3   Lancets (ONETOUCH DELICA PLUS LANCET33G) MISC Apply topically.     lisinopril (ZESTRIL) 10 MG tablet Take 1 tablet (10 mg total) by mouth daily. 30 tablet 0   metoprolol succinate (TOPROL-XL) 50 MG 24 hr tablet TAKE 1 TABLET BY MOUTH DAILY. TAKE WITH OR IMMEDIATELY FOLLOWING A MEAL. 90 tablet 1   metoprolol tartrate (LOPRESSOR) 25 MG tablet Take 1 table as needed for heart rate above 120bpm. 90 tablet 3   ONETOUCH VERIO test strip 1 each 2 (two) times daily.     rosuvastatin (CRESTOR) 40 MG tablet Take 40 mg by mouth daily.     spironolactone (ALDACTONE) 25 MG tablet Take 1 tablet (25 mg total) by mouth daily. Take 25 mg daily until your weight is 244 pounds. Once your weight reaches 244 pounds, then take 25 mg every other day. 90 tablet 3   sucralfate (CARAFATE) 1 g tablet Take 1 tablet (1 g total) by mouth 4 (four) times daily -  with meals and at bedtime. 21 tablet 0    warfarin (COUMADIN) 7.5 MG tablet Take 7.5 mg by mouth. 1 tablet by mouth Tuesday, Thursday,and Saturday 1/2 tablet on the other days     insulin NPH-regular Human (70-30) 100 UNIT/ML injection Inject 20 Units into the skin 2 (two) times daily with a meal. (Patient taking differently: Inject 20-50 Units into the skin 2 (two) times daily with a meal.) 12 mL 0   polyethylene glycol (MIRALAX / GLYCOLAX) 17 g packet Take 17 g by mouth daily. (Patient not taking: Reported on 08/23/2023) 14 each 0   No facility-administered medications prior to visit.     Review of Systems:   Constitutional: No night sweats, fevers, chills, fatigue, or lassitude. +weight gain HEENT: No headaches, difficulty swallowing, tooth/dental problems, or sore throat. No sneezing, itching, ear ache, nasal congestion, or post nasal drip CV:  +swelling in lower extremities. No chest pain, orthopnea, PND,  anasarca, dizziness, palpitations, syncope Resp: No shortness of breath with exertion or at rest. No excess mucus or change in color of mucus. No productive or non-productive. No hemoptysis. No wheezing.  No chest wall deformity GI:  No heartburn, indigestion, abdominal pain, nausea, vomiting, diarrhea, change in bowel habits, loss of appetite, bloody stools.  GU: No dysuria, change in color of urine, urgency or frequency.  No flank pain, no hematuria  Skin: No rash, lesions, ulcerations MSK:  No joint pain or swelling.   Neuro: No dizziness or lightheadedness.  Psych: No depression or anxiety. Mood stable.     Physical Exam:  BP 130/64 (BP Location: Right Arm, Cuff Size: Large)   Pulse 73   Ht 5\' 3"  (1.6 m)   Wt 264 lb (119.7 kg)   SpO2 100%   BMI 46.77 kg/m   GEN: Pleasant, interactive, well-kempt; chronically-ill appearing; morbidly obese; in no acute distress HEENT:  Normocephalic and atraumatic. PERRLA. Sclera white. Nasal turbinates pink, moist and patent bilaterally. No rhinorrhea present. Oropharynx pink and  moist, without exudate or edema. No lesions, ulcerations, or postnasal drip.  NECK:  Supple w/ fair ROM. No JVD present. Normal carotid impulses w/o bruits. Thyroid symmetrical with no goiter or nodules palpated. No lymphadenopathy.   CV: RRR, no m/r/g, +2 BLE peripheral edema. Pulses intact, +2 bilaterally. No cyanosis, pallor or clubbing. PULMONARY:  Unlabored, regular breathing. Clear bilaterally A&P w/o wheezes/rales/rhonchi. No accessory muscle use.  GI: BS present and normoactive. Soft, non-tender to palpation. No organomegaly or masses detected.  MSK: No erythema, warmth or tenderness. Cap refil <2 sec all extrem. No deformities or joint swelling noted.  Neuro: A/Ox3. No focal deficits noted.   Skin: Warm, no lesions or rashe Psych: Normal affect and behavior. Judgement and thought content appropriate.     Lab Results:  CBC    Component Value Date/Time   WBC 9.2 07/26/2023 0757   RBC 4.26 07/26/2023 0757   HGB 12.2 07/26/2023 0757   HGB 13.0 09/15/2015 0958   HCT 36.7 07/26/2023 0757   HCT 38.8 09/15/2015 0958   PLT 223 07/26/2023 0757   PLT 164 09/15/2015 0958   MCV 86.2 07/26/2023 0757   MCV 85.7 09/15/2015 0958   MCH 28.6 07/26/2023 0757   MCHC 33.2 07/26/2023 0757   RDW 12.3 07/26/2023 0757   RDW 12.9 09/15/2015 0958   LYMPHSABS 1.1 07/26/2023 0757   LYMPHSABS 1.4 09/15/2015 0958   MONOABS 0.7 07/26/2023 0757   MONOABS 0.6 09/15/2015 0958   EOSABS 0.0 07/26/2023 0757   EOSABS 0.2 09/15/2015 0958   BASOSABS 0.1 07/26/2023 0757   BASOSABS 0.0 09/15/2015 0958    BMET    Component Value Date/Time   NA 133 (L) 07/26/2023 0757   NA 137 11/16/2019 1552   NA 137 09/15/2015 0959   K 3.7 07/26/2023 0757   K 4.0 09/15/2015 0959   CL 95 (L) 07/26/2023 0757   CL 99 09/12/2012 0959   CO2 27 07/26/2023 0757   CO2 27 09/15/2015 0959   GLUCOSE 287 (H) 07/26/2023 0757   GLUCOSE 206 (H) 09/15/2015 0959   GLUCOSE 217 (H) 09/12/2012 0959   BUN 10 07/26/2023 0757   BUN  12 11/16/2019 1552   BUN 16.1 09/15/2015 0959   CREATININE 0.98 07/26/2023 0757   CREATININE 1.03 (H) 01/30/2016 1058   CREATININE 1.2 (H) 09/15/2015 0959   CALCIUM 9.7 07/26/2023 0757   CALCIUM 9.9 09/15/2015 0959   GFRNONAA >60 07/26/2023 0757  GFRAA 52 (L) 11/16/2019 1552    BNP    Component Value Date/Time   BNP 174.2 (H) 10/01/2022 1756     Imaging:  CT Super D Chest Wo Contrast  Result Date: 08/31/2023 CLINICAL DATA:  Lung nodule.  History of lung cancer. EXAM: CT CHEST WITHOUT CONTRAST TECHNIQUE: Multidetector CT imaging of the chest was performed using thin slice collimation for electromagnetic bronchoscopy planning purposes, without intravenous contrast. RADIATION DOSE REDUCTION: This exam was performed according to the departmental dose-optimization program which includes automated exposure control, adjustment of the mA and/or kV according to patient size and/or use of iterative reconstruction technique. COMPARISON:  07/06/2022. FINDINGS: Cardiovascular: Atherosclerotic calcification of the aorta, aortic valve and coronary arteries. Heart Enlarged pulmonic trunk and heart. No pericardial effusion. Mediastinum/Nodes: No pathologically enlarged mediastinal or axillary lymph nodes. Hilar regions are difficult to definitively evaluate without IV contrast. Esophagus is grossly unremarkable. Lungs/Pleura: Image quality is degraded by respiratory motion and expiratory phase imaging. Left upper lobectomy. Postoperative scarring in the left lower lobe. No pleural fluid. Airway is otherwise unremarkable. Upper Abdomen: Cholecystectomy. Small hiatal hernia. Visualized portions of the liver, adrenal glands, right kidney, spleen, pancreas, stomach and bowel are otherwise grossly unremarkable. No upper abdominal adenopathy. Musculoskeletal: Degenerative changes in the spine. Left thoracotomy. No worrisome lytic or sclerotic lesions. IMPRESSION: 1. No evidence of recurrent or metastatic disease. 2.  Small hiatal hernia. 3. Aortic atherosclerosis (ICD10-I70.0). Coronary artery calcification. 4. Enlarged pulmonic trunk, indicative of pulmonary arterial hypertension. Electronically Signed   By: Leanna Battles M.D.   On: 08/31/2023 13:41    Administration History     None          Latest Ref Rng & Units 08/12/2017    1:33 PM  PFT Results  FVC-Pre L 1.57   FVC-Predicted Pre % 67   FVC-Post L 1.38   FVC-Predicted Post % 59   Pre FEV1/FVC % % 84   Post FEV1/FCV % % 79   FEV1-Pre L 1.32   FEV1-Predicted Pre % 73   FEV1-Post L 1.09   DLCO uncorrected ml/min/mmHg 12.97   DLCO UNC% % 56   DLCO corrected ml/min/mmHg 14.13   DLCO COR %Predicted % 61   DLVA Predicted % 113     No results found for: "NITRICOXIDE"      Assessment & Plan:   Pulmonary nodules/lesions, multiple Surveillance CT chest without new or suspicious nodules/masses, documenting 2 years stability. No dedicated follow up necessary at this time. Quit smoking 1986 so she is not a candidate for lung cancer screening program.  Patient Instructions  Your CT chest scan has been stable for the last 2 years without any new nodules or masses. You do not need any further dedicated chest imaging and you do not qualify for the lung cancer screening program since you quit smoking so long ago.   Let us know if you ever develop new respiratory symptoms that are bothersome and we will see you back. Otherwise, you do not have to have scheduled follow up with [place]   Cancer of left lung parenchyma S/p LUL lobectomy 01/2009. No evidence of recurrence.   Chronic diastolic heart failure (HCC) She is still relatively hypervolemic. Encouraged to contact cardiology if she does not start to have improvement in swelling or weight loss over the next few days. Follow up with cardiology as scheduled or sooner, if needed   Advised if symptoms do not improve or worsen, to please contact office for sooner follow up  or seek emergency care.    I spent 25 minutes of dedicated to the care of this patient on the date of this encounter to include pre-visit review of records, face-to-face time with the patient discussing conditions above, post visit ordering of testing, clinical documentation with the electronic health record, making appropriate referrals as documented, and communicating necessary findings to members of the patients care team.  Noemi Chapel, NP 09/01/2023  Pt aware and understands NP's role.

## 2023-08-31 NOTE — Assessment & Plan Note (Signed)
Surveillance CT chest without new or suspicious nodules/masses, documenting 2 years stability. No dedicated follow up necessary at this time. Quit smoking 1986 so she is not a candidate for lung cancer screening program.  Patient Instructions  Your CT chest scan has been stable for the last 2 years without any new nodules or masses. You do not need any further dedicated chest imaging and you do not qualify for the lung cancer screening program since you quit smoking so long ago.   Let us know if you ever develop new respiratory symptoms that are bothersome and we will see you back. Otherwise, you do not have to have scheduled follow up with [place]

## 2023-08-31 NOTE — Patient Instructions (Addendum)
Your CT chest scan has been stable for the last 2 years without any new nodules or masses. You do not need any further dedicated chest imaging and you do not qualify for the lung cancer screening program since you quit smoking so long ago.   Let us know if you ever develop new respiratory symptoms that are bothersome and we will see you back. Otherwise, you do not have to have scheduled follow up with [place]

## 2023-09-01 ENCOUNTER — Encounter: Payer: Self-pay | Admitting: Nurse Practitioner

## 2023-09-01 NOTE — Assessment & Plan Note (Signed)
She is still relatively hypervolemic. Encouraged to contact cardiology if she does not start to have improvement in swelling or weight loss over the next few days. Follow up with cardiology as scheduled or sooner, if needed

## 2023-09-01 NOTE — Assessment & Plan Note (Addendum)
S/p LUL lobectomy 01/2009. No evidence of recurrence.

## 2023-09-05 ENCOUNTER — Telehealth: Payer: Self-pay | Admitting: Emergency Medicine

## 2023-09-05 NOTE — Telephone Encounter (Signed)
DRI disk will be placed in [facility]'s box.

## 2023-09-08 DIAGNOSIS — I5043 Acute on chronic combined systolic (congestive) and diastolic (congestive) heart failure: Secondary | ICD-10-CM | POA: Diagnosis not present

## 2023-09-08 DIAGNOSIS — I5032 Chronic diastolic (congestive) heart failure: Secondary | ICD-10-CM | POA: Diagnosis not present

## 2023-09-08 DIAGNOSIS — S2232XS Fracture of one rib, left side, sequela: Secondary | ICD-10-CM | POA: Diagnosis not present

## 2023-09-08 DIAGNOSIS — J449 Chronic obstructive pulmonary disease, unspecified: Secondary | ICD-10-CM | POA: Diagnosis not present

## 2023-09-08 DIAGNOSIS — I5023 Acute on chronic systolic (congestive) heart failure: Secondary | ICD-10-CM | POA: Diagnosis not present

## 2023-09-08 DIAGNOSIS — M6281 Muscle weakness (generalized): Secondary | ICD-10-CM | POA: Diagnosis not present

## 2023-09-08 DIAGNOSIS — M1711 Unilateral primary osteoarthritis, right knee: Secondary | ICD-10-CM | POA: Diagnosis not present

## 2023-09-08 NOTE — Progress Notes (Signed)
Cardiology Office Note:  .   Date:  09/13/2023  ID:  [name], DOB 22-Apr-1951, MRN [id number] PCP: Georgianne Fick, MD  [facility] Providers Cardiologist:  Thurmon Fair, MD  }   History of Present Illness: .   [name] is a 72 y.o. female with history of  CABG (LIMA-LAD 11/18), PAD, chronic diastolic CHF, and paroxysmal atrial fibrillation (TEE-cardioversion October 2022), type 2 diabetes on insulin, peripheral venous insufficiency with history of venous ulcers, bilateral knee degenerative joint disease with limited mobility and morbid obesity.  Last seen by Dr. Royann Shivers on 08/23/2023, he added spironolactone to 25 mg daily to be taken daily until her weight reaches 244 pounds and then go back to original prescription of diuretics of 40 mg of Lasix every other day.  He reviewed her need to restrict her sodium.  She was to continue her Coumadin as directed.  She is here for follow-up to evaluate her response to medication changes.  She comes today without any new cardiac complaints.  She has not taken spironolactone or Lasix today as she has been going to doctors appointments.  Blood pressure is elevated as a result.  She does not normally take her blood pressure at home so she has no documentation of blood pressures on this medication.  She denies any dizziness, palpitations, fatigue, or chest discomfort.  She has been seen by Coumadin clinic today and has been ordered labs to evaluate kidney function and liver function.  She is medically compliant.  She is doing her best concerning salt.  She continues to have complaints of venous insufficiency and chronic lower extremity edema.  She does not always wears compression hose as they are difficult for her to put on.  ROS: As above otherwise negative  Studies Reviewed: .      Echocardiogram 07/14/2021  1. Left ventricular ejection fraction, by estimation, is 55 to 60%. The  left ventricle has normal function. The left  ventricle has no regional  wall motion abnormalities. There is moderate asymmetric left ventricular  hypertrophy of the septal segment.  Left ventricular diastolic parameters are indeterminate.   2. Right ventricular systolic function is mildly reduced. The right  ventricular size is mildly enlarged.   3. Right atrial size was mildly dilated.   4. The mitral valve is normal in structure. No evidence of mitral valve  regurgitation. No evidence of mitral stenosis.   5. Tricuspid valve regurgitation is moderate.   6. The aortic valve is normal in structure. Aortic valve regurgitation is  not visualized. Mild aortic valve sclerosis is present, with no evidence  of aortic valve stenosis.   7. The inferior vena cava is normal in size with greater than 50%  respiratory variability, suggesting right atrial pressure of 3 mmHg.    Physical Exam:   VS:  BP (!) 154/70   Pulse 81   Ht 5\' 3"  (1.6 m)   Wt 262 lb (118.8 kg)   SpO2 99%   BMI 46.41 kg/m    Wt Readings from Last 3 Encounters:  09/13/23 262 lb (118.8 kg)  08/31/23 264 lb (119.7 kg)  08/23/23 264 lb 12.8 oz (120.1 kg)    GEN: Well nourished, well developed in no acute distress NECK: No JVD; No carotid bruits CARDIAC: RRR, no murmurs, rubs, gallops RESPIRATORY:  Clear to auscultation without rales, wheezing or rhonchi  ABDOMEN: Soft, non-tender, non-distended EXTREMITIES: Bilateral woody edema; with mild puffiness in the ankles.  No deformity  ASSESSMENT AND PLAN: .    Paroxysmal atrial fibrillation: Patient continues on Coumadin therapy with an INR of 3.6 today.  She is being sent to the labs for B med and LFTs.  She denies any excessive bleeding or bruising on Coumadin.  Heart rate is well-controlled today.  2.  Hypertension: Elevated today in the office.  She has not taken Lasix or spironolactone before coming today.  Unable to assess her response to medication changes as she had been increased to spironolactone daily from 3  times a week.  She does not take her blood pressure at home.  I have asked her to do so so that we can have some documentation to compare what her blood pressures are away from the office on the medication regimen.  She verbalizes understanding.  She is to adhere to salt avoidance is much as possible.  3.  Chronic diastolic heart failure: No evidence of volume overload today.  Weight comparison from last office visit is stable from 264 to 262 pounds.  No changes in her regimen at this time she is doing avoid salt.  4.  Chronic venous insufficiency: She has been advised to wear support hose is much as possible and to put them on prior to getting out of bed before her legs began to swell.  She is to keep her feet elevated is much as possible when she is out of bed.  Low-sodium diet is essential.  Continue diuretics and blood pressure control.         Signed, Bettey Mare. Liborio Nixon, ANP, AACC

## 2023-09-13 ENCOUNTER — Ambulatory Visit (INDEPENDENT_AMBULATORY_CARE_PROVIDER_SITE_OTHER): Payer: PPO | Admitting: Adult Health

## 2023-09-13 ENCOUNTER — Encounter: Payer: Self-pay | Admitting: Adult Health

## 2023-09-13 ENCOUNTER — Ambulatory Visit: Payer: PPO | Attending: Cardiology

## 2023-09-13 VITALS — BP 154/70 | HR 81 | Ht 63.0 in | Wt 262.0 lb

## 2023-09-13 DIAGNOSIS — I5032 Chronic diastolic (congestive) heart failure: Secondary | ICD-10-CM | POA: Diagnosis not present

## 2023-09-13 DIAGNOSIS — I10 Essential (primary) hypertension: Secondary | ICD-10-CM | POA: Diagnosis not present

## 2023-09-13 DIAGNOSIS — Z7901 Long term (current) use of anticoagulants: Secondary | ICD-10-CM | POA: Diagnosis not present

## 2023-09-13 DIAGNOSIS — I480 Paroxysmal atrial fibrillation: Secondary | ICD-10-CM

## 2023-09-13 DIAGNOSIS — I872 Venous insufficiency (chronic) (peripheral): Secondary | ICD-10-CM

## 2023-09-13 LAB — POCT INR: INR: 3.9 — AB (ref 2.0–3.0)

## 2023-09-13 NOTE — Patient Instructions (Signed)
Medication Instructions:  No Changes *If you need a refill on your cardiac medications before your next appointment, please call your pharmacy*   Lab Work: No Labs If you have labs (blood work) drawn today and your tests are completely normal, you will receive your results only by: MyChart Message (if you have MyChart) OR A paper copy in the mail If you have any lab test that is abnormal or we need to change your treatment, we will call you to review the results.   Testing/Procedures: No Testing   Follow-Up: At [facility], you and your health needs are our priority.  As part of our continuing mission to provide you with exceptional heart care, we have created designated Provider Care Teams.  These Care Teams include your primary Cardiologist (physician) and Advanced Practice Providers (APPs -  Physician Assistants and Nurse Practitioners) who all work together to provide you with the care you need, when you need it.  We recommend signing up for the patient portal called "MyChart".  Sign up information is provided on this After Visit Summary.  MyChart is used to connect with patients for Virtual Visits (Telemedicine).  Patients are able to view lab/test results, encounter notes, upcoming appointments, etc.  Non-urgent messages can be sent to your provider as well.   To learn more about what you can do with MyChart, go to [email or web address].    Your next appointment:   6 month(s)  Provider:   Mihai Croitoru, MD   

## 2023-09-13 NOTE — Patient Instructions (Signed)
DO NOT TAKE ANY WARFARIN TODAY THEN START taking warfarin 0.5 tablet daily except for 1 tablet on Tuesday and Thursday. Recheck INR in 3 weeks [facility]in [facility] [phone]

## 2023-09-15 DIAGNOSIS — Z515 Encounter for palliative care: Secondary | ICD-10-CM | POA: Diagnosis not present

## 2023-09-15 DIAGNOSIS — Z6841 Body Mass Index (BMI) 40.0 and over, adult: Secondary | ICD-10-CM | POA: Diagnosis not present

## 2023-09-15 DIAGNOSIS — D6869 Other thrombophilia: Secondary | ICD-10-CM | POA: Diagnosis not present

## 2023-09-15 DIAGNOSIS — E119 Type 2 diabetes mellitus without complications: Secondary | ICD-10-CM | POA: Diagnosis not present

## 2023-09-15 DIAGNOSIS — I480 Paroxysmal atrial fibrillation: Secondary | ICD-10-CM | POA: Diagnosis not present

## 2023-09-15 DIAGNOSIS — Z7985 Long-term (current) use of injectable non-insulin antidiabetic drugs: Secondary | ICD-10-CM | POA: Diagnosis not present

## 2023-09-18 ENCOUNTER — Other Ambulatory Visit: Payer: Self-pay | Admitting: Student

## 2023-09-20 DIAGNOSIS — I10 Essential (primary) hypertension: Secondary | ICD-10-CM | POA: Diagnosis not present

## 2023-09-20 DIAGNOSIS — E785 Hyperlipidemia, unspecified: Secondary | ICD-10-CM | POA: Diagnosis not present

## 2023-09-20 DIAGNOSIS — E1169 Type 2 diabetes mellitus with other specified complication: Secondary | ICD-10-CM | POA: Diagnosis not present

## 2023-09-20 DIAGNOSIS — I2510 Atherosclerotic heart disease of native coronary artery without angina pectoris: Secondary | ICD-10-CM | POA: Diagnosis not present

## 2023-09-20 DIAGNOSIS — I480 Paroxysmal atrial fibrillation: Secondary | ICD-10-CM | POA: Diagnosis not present

## 2023-09-20 DIAGNOSIS — R54 Age-related physical debility: Secondary | ICD-10-CM | POA: Diagnosis not present

## 2023-09-27 ENCOUNTER — Ambulatory Visit: Payer: PPO

## 2023-09-27 DIAGNOSIS — M6281 Muscle weakness (generalized): Secondary | ICD-10-CM | POA: Diagnosis not present

## 2023-09-27 DIAGNOSIS — I5043 Acute on chronic combined systolic (congestive) and diastolic (congestive) heart failure: Secondary | ICD-10-CM | POA: Diagnosis not present

## 2023-09-27 DIAGNOSIS — J449 Chronic obstructive pulmonary disease, unspecified: Secondary | ICD-10-CM | POA: Diagnosis not present

## 2023-09-27 DIAGNOSIS — M1711 Unilateral primary osteoarthritis, right knee: Secondary | ICD-10-CM | POA: Diagnosis not present

## 2023-09-27 DIAGNOSIS — I5032 Chronic diastolic (congestive) heart failure: Secondary | ICD-10-CM | POA: Diagnosis not present

## 2023-09-27 DIAGNOSIS — I5023 Acute on chronic systolic (congestive) heart failure: Secondary | ICD-10-CM | POA: Diagnosis not present

## 2023-09-27 DIAGNOSIS — S2232XS Fracture of one rib, left side, sequela: Secondary | ICD-10-CM | POA: Diagnosis not present

## 2023-10-04 DIAGNOSIS — M79675 Pain in left toe(s): Secondary | ICD-10-CM | POA: Diagnosis not present

## 2023-10-04 DIAGNOSIS — E1151 Type 2 diabetes mellitus with diabetic peripheral angiopathy without gangrene: Secondary | ICD-10-CM | POA: Diagnosis not present

## 2023-10-04 DIAGNOSIS — M2041 Other hammer toe(s) (acquired), right foot: Secondary | ICD-10-CM | POA: Diagnosis not present

## 2023-10-04 DIAGNOSIS — B351 Tinea unguium: Secondary | ICD-10-CM | POA: Diagnosis not present

## 2023-10-04 DIAGNOSIS — L84 Corns and callosities: Secondary | ICD-10-CM | POA: Diagnosis not present

## 2023-10-04 DIAGNOSIS — M24571 Contracture, right ankle: Secondary | ICD-10-CM | POA: Diagnosis not present

## 2023-10-05 ENCOUNTER — Ambulatory Visit
Admission: RE | Admit: 2023-10-05 | Discharge: 2023-10-05 | Disposition: A | Discharge status: Home / Self Care | Payer: PPO | Source: Ambulatory Visit | Attending: Internal Medicine | Admitting: Internal Medicine

## 2023-10-05 ENCOUNTER — Ambulatory Visit: Payer: PPO | Attending: Internal Medicine

## 2023-10-05 DIAGNOSIS — I480 Paroxysmal atrial fibrillation: Secondary | ICD-10-CM | POA: Diagnosis not present

## 2023-10-05 DIAGNOSIS — Z1231 Encounter for screening mammogram for malignant neoplasm of breast: Secondary | ICD-10-CM

## 2023-10-05 DIAGNOSIS — Z7901 Long term (current) use of anticoagulants: Secondary | ICD-10-CM | POA: Diagnosis not present

## 2023-10-05 LAB — POCT INR: INR: 1.4 — AB (ref 2.0–3.0)

## 2023-10-05 NOTE — Patient Instructions (Signed)
 Description   Take 1 tablet today and 1.5 tablets tomorrow and then resume taking warfarin 0.5 tablet daily except for 1 tablet on Tuesday and Thursday.  Recheck INR in 2 weeks [facility] [phone]

## 2023-10-06 ENCOUNTER — Telehealth: Payer: Self-pay

## 2023-10-06 NOTE — Telephone Encounter (Signed)
 [name] in the lab reports patient came in to have  blood drawn yesterday. She stated this is the second time coming in for labs and she could not get her blood. Patient was not well hydrated both times. Patient is due a BMP from office visit on 11/26.

## 2023-10-09 DIAGNOSIS — J449 Chronic obstructive pulmonary disease, unspecified: Secondary | ICD-10-CM | POA: Diagnosis not present

## 2023-10-09 DIAGNOSIS — I5043 Acute on chronic combined systolic (congestive) and diastolic (congestive) heart failure: Secondary | ICD-10-CM | POA: Diagnosis not present

## 2023-10-09 DIAGNOSIS — I5032 Chronic diastolic (congestive) heart failure: Secondary | ICD-10-CM | POA: Diagnosis not present

## 2023-10-09 DIAGNOSIS — M1711 Unilateral primary osteoarthritis, right knee: Secondary | ICD-10-CM | POA: Diagnosis not present

## 2023-10-09 DIAGNOSIS — I5023 Acute on chronic systolic (congestive) heart failure: Secondary | ICD-10-CM | POA: Diagnosis not present

## 2023-10-09 DIAGNOSIS — S2232XS Fracture of one rib, left side, sequela: Secondary | ICD-10-CM | POA: Diagnosis not present

## 2023-10-09 DIAGNOSIS — M6281 Muscle weakness (generalized): Secondary | ICD-10-CM | POA: Diagnosis not present

## 2023-10-09 NOTE — Telephone Encounter (Signed)
 Maybe we should try drawing elsewhere?

## 2023-10-10 NOTE — Telephone Encounter (Signed)
 Spoke with the patient and she reports that she was not very well hydrated the last 2 times they tried to draw her blood- she had not drank hardly anything. Asked her if she could try going to another [facility] to get blood drawn and she is not familiar with any other locations. She reports that she has a Coumadin  Clinic Appt on 1/21- she said that she will make sure to be hydrated and will try again to get lab work done on that day. Instructed her to start working on being will hydrated 24 hours before her appointment- drink plenty of fluids for 24 hours up until her appt time. She verbalized understanding.

## 2023-10-18 ENCOUNTER — Ambulatory Visit: Payer: PPO | Attending: Cardiology | Admitting: *Deleted

## 2023-10-18 DIAGNOSIS — I480 Paroxysmal atrial fibrillation: Secondary | ICD-10-CM | POA: Diagnosis not present

## 2023-10-18 DIAGNOSIS — H26491 Other secondary cataract, right eye: Secondary | ICD-10-CM | POA: Diagnosis not present

## 2023-10-18 DIAGNOSIS — E113393 Type 2 diabetes mellitus with moderate nonproliferative diabetic retinopathy without macular edema, bilateral: Secondary | ICD-10-CM | POA: Diagnosis not present

## 2023-10-18 DIAGNOSIS — Z7901 Long term (current) use of anticoagulants: Secondary | ICD-10-CM | POA: Diagnosis not present

## 2023-10-18 DIAGNOSIS — I5032 Chronic diastolic (congestive) heart failure: Secondary | ICD-10-CM | POA: Diagnosis not present

## 2023-10-18 DIAGNOSIS — H52203 Unspecified astigmatism, bilateral: Secondary | ICD-10-CM | POA: Diagnosis not present

## 2023-10-18 DIAGNOSIS — Z79899 Other long term (current) drug therapy: Secondary | ICD-10-CM | POA: Diagnosis not present

## 2023-10-18 DIAGNOSIS — H04123 Dry eye syndrome of bilateral lacrimal glands: Secondary | ICD-10-CM | POA: Diagnosis not present

## 2023-10-18 LAB — POCT INR: INR: 1.4 — AB (ref 2.0–3.0)

## 2023-10-18 NOTE — Patient Instructions (Addendum)
Description   Take 1.5 tablets today and 1 tablet tomorrow and then START taking warfarin 1/2 tablet daily except for 1 tablet on Tuesday, Thursday, and Saturday.  Recheck INR in 2 weeks. Coumadin Clinic [phone]

## 2023-10-19 LAB — BASIC METABOLIC PANEL
BUN/Creatinine Ratio: 18 (ref 12–28)
BUN: 22 mg/dL (ref 8–27)
CO2: 26 mmol/L (ref 20–29)
Calcium: 9.8 mg/dL (ref 8.7–10.3)
Chloride: 97 mmol/L (ref 96–106)
Creatinine, Ser: 1.19 mg/dL — ABNORMAL HIGH (ref 0.57–1.00)
Glucose: 255 mg/dL — ABNORMAL HIGH (ref 70–99)
Potassium: 4.4 mmol/L (ref 3.5–5.2)
Sodium: 136 mmol/L (ref 134–144)
eGFR: 48 mL/min/{1.73_m2} — ABNORMAL LOW (ref >59)

## 2023-10-22 ENCOUNTER — Other Ambulatory Visit: Payer: Self-pay | Admitting: Cardiovascular Disease

## 2023-10-22 DIAGNOSIS — I480 Paroxysmal atrial fibrillation: Secondary | ICD-10-CM

## 2023-10-28 DIAGNOSIS — M1711 Unilateral primary osteoarthritis, right knee: Secondary | ICD-10-CM | POA: Diagnosis not present

## 2023-10-28 DIAGNOSIS — I5032 Chronic diastolic (congestive) heart failure: Secondary | ICD-10-CM | POA: Diagnosis not present

## 2023-10-28 DIAGNOSIS — M6281 Muscle weakness (generalized): Secondary | ICD-10-CM | POA: Diagnosis not present

## 2023-10-28 DIAGNOSIS — S2232XS Fracture of one rib, left side, sequela: Secondary | ICD-10-CM | POA: Diagnosis not present

## 2023-10-28 DIAGNOSIS — J449 Chronic obstructive pulmonary disease, unspecified: Secondary | ICD-10-CM | POA: Diagnosis not present

## 2023-10-28 DIAGNOSIS — I5023 Acute on chronic systolic (congestive) heart failure: Secondary | ICD-10-CM | POA: Diagnosis not present

## 2023-10-28 DIAGNOSIS — I5043 Acute on chronic combined systolic (congestive) and diastolic (congestive) heart failure: Secondary | ICD-10-CM | POA: Diagnosis not present

## 2023-11-02 ENCOUNTER — Ambulatory Visit: Payer: PPO | Attending: Cardiology | Admitting: *Deleted

## 2023-11-02 DIAGNOSIS — I480 Paroxysmal atrial fibrillation: Secondary | ICD-10-CM

## 2023-11-02 DIAGNOSIS — Z7901 Long term (current) use of anticoagulants: Secondary | ICD-10-CM

## 2023-11-02 DIAGNOSIS — Z5181 Encounter for therapeutic drug level monitoring: Secondary | ICD-10-CM | POA: Diagnosis not present

## 2023-11-02 LAB — POCT INR: INR: 1.5 — AB (ref 2.0–3.0)

## 2023-11-02 NOTE — Patient Instructions (Signed)
 Description   Take 1.5 tablets today and 1.5 tablets tomorrow and then START taking warfarin 1 tablet daily except for 1/2 tablet on Monday and Wednesday.  Recheck INR in 2 weeks. Coumadin  Clinic [phone]

## 2023-11-03 ENCOUNTER — Telehealth: Payer: Self-pay | Admitting: Cardiovascular Disease

## 2023-11-03 MED ORDER — METOPROLOL SUCCINATE ER 50 MG PO TB24: 50.0000 mg | ORAL_TABLET | Freq: Every day | ORAL | 3 refills | Status: AC

## 2023-11-03 MED ORDER — METOPROLOL TARTRATE 25 MG PO TABS: ORAL_TABLET | ORAL | 3 refills | Status: AC

## 2023-11-03 NOTE — Telephone Encounter (Signed)
*  STAT* If patient is at the pharmacy, call can be transferred to refill team.   1. Which medications need to be refilled? (please list name of each medication and dose if known) Meto[rolol   2. Would you like to learn more about the convenience, safety, & potential cost savings by using the [facility]?    3. Are you open to using the [facility] (Type [facility].    4. Which pharmacy/location (including street and city if local pharmacy) is medication to be sent to?[facility] [address]   5. Do they need a 30 day or 90 day supply? 90 days and refills

## 2023-11-03 NOTE — Telephone Encounter (Signed)
 Pt's medication was sent to pt's pharmacy as requested. Confirmation received.

## 2023-11-07 DIAGNOSIS — Z7901 Long term (current) use of anticoagulants: Secondary | ICD-10-CM | POA: Diagnosis not present

## 2023-11-07 DIAGNOSIS — I480 Paroxysmal atrial fibrillation: Secondary | ICD-10-CM | POA: Diagnosis not present

## 2023-11-07 DIAGNOSIS — E1169 Type 2 diabetes mellitus with other specified complication: Secondary | ICD-10-CM | POA: Diagnosis not present

## 2023-11-07 DIAGNOSIS — R1310 Dysphagia, unspecified: Secondary | ICD-10-CM | POA: Diagnosis not present

## 2023-11-07 DIAGNOSIS — E785 Hyperlipidemia, unspecified: Secondary | ICD-10-CM | POA: Diagnosis not present

## 2023-11-07 DIAGNOSIS — I5022 Chronic systolic (congestive) heart failure: Secondary | ICD-10-CM | POA: Diagnosis not present

## 2023-11-07 DIAGNOSIS — R54 Age-related physical debility: Secondary | ICD-10-CM | POA: Diagnosis not present

## 2023-11-07 DIAGNOSIS — I110 Hypertensive heart disease with heart failure: Secondary | ICD-10-CM | POA: Diagnosis not present

## 2023-11-09 DIAGNOSIS — M6281 Muscle weakness (generalized): Secondary | ICD-10-CM | POA: Diagnosis not present

## 2023-11-09 DIAGNOSIS — S2232XS Fracture of one rib, left side, sequela: Secondary | ICD-10-CM | POA: Diagnosis not present

## 2023-11-09 DIAGNOSIS — E10319 Type 1 diabetes mellitus with unspecified diabetic retinopathy without macular edema: Secondary | ICD-10-CM | POA: Diagnosis not present

## 2023-11-09 DIAGNOSIS — E1065 Type 1 diabetes mellitus with hyperglycemia: Secondary | ICD-10-CM | POA: Diagnosis not present

## 2023-11-09 DIAGNOSIS — I130 Hypertensive heart and chronic kidney disease with heart failure and stage 1 through stage 4 chronic kidney disease, or unspecified chronic kidney disease: Secondary | ICD-10-CM | POA: Diagnosis not present

## 2023-11-09 DIAGNOSIS — I4821 Permanent atrial fibrillation: Secondary | ICD-10-CM | POA: Diagnosis not present

## 2023-11-09 DIAGNOSIS — I5043 Acute on chronic combined systolic (congestive) and diastolic (congestive) heart failure: Secondary | ICD-10-CM | POA: Diagnosis not present

## 2023-11-09 DIAGNOSIS — N1831 Chronic kidney disease, stage 3a: Secondary | ICD-10-CM | POA: Diagnosis not present

## 2023-11-09 DIAGNOSIS — I5023 Acute on chronic systolic (congestive) heart failure: Secondary | ICD-10-CM | POA: Diagnosis not present

## 2023-11-09 DIAGNOSIS — M1711 Unilateral primary osteoarthritis, right knee: Secondary | ICD-10-CM | POA: Diagnosis not present

## 2023-11-09 DIAGNOSIS — J449 Chronic obstructive pulmonary disease, unspecified: Secondary | ICD-10-CM | POA: Diagnosis not present

## 2023-11-09 DIAGNOSIS — I5032 Chronic diastolic (congestive) heart failure: Secondary | ICD-10-CM | POA: Diagnosis not present

## 2023-11-14 DIAGNOSIS — D692 Other nonthrombocytopenic purpura: Secondary | ICD-10-CM | POA: Diagnosis not present

## 2023-11-14 DIAGNOSIS — E10319 Type 1 diabetes mellitus with unspecified diabetic retinopathy without macular edema: Secondary | ICD-10-CM | POA: Diagnosis not present

## 2023-11-14 DIAGNOSIS — I10 Essential (primary) hypertension: Secondary | ICD-10-CM | POA: Diagnosis not present

## 2023-11-14 DIAGNOSIS — I130 Hypertensive heart and chronic kidney disease with heart failure and stage 1 through stage 4 chronic kidney disease, or unspecified chronic kidney disease: Secondary | ICD-10-CM | POA: Diagnosis not present

## 2023-11-14 DIAGNOSIS — I4821 Permanent atrial fibrillation: Secondary | ICD-10-CM | POA: Diagnosis not present

## 2023-11-14 DIAGNOSIS — I129 Hypertensive chronic kidney disease with stage 1 through stage 4 chronic kidney disease, or unspecified chronic kidney disease: Secondary | ICD-10-CM | POA: Diagnosis not present

## 2023-11-14 DIAGNOSIS — E782 Mixed hyperlipidemia: Secondary | ICD-10-CM | POA: Diagnosis not present

## 2023-11-14 DIAGNOSIS — I2510 Atherosclerotic heart disease of native coronary artery without angina pectoris: Secondary | ICD-10-CM | POA: Diagnosis not present

## 2023-11-14 DIAGNOSIS — D6869 Other thrombophilia: Secondary | ICD-10-CM | POA: Diagnosis not present

## 2023-11-14 DIAGNOSIS — N1831 Chronic kidney disease, stage 3a: Secondary | ICD-10-CM | POA: Diagnosis not present

## 2023-11-15 ENCOUNTER — Ambulatory Visit: Payer: HMO | Attending: Cardiovascular Disease

## 2023-11-15 DIAGNOSIS — I480 Paroxysmal atrial fibrillation: Secondary | ICD-10-CM

## 2023-11-15 DIAGNOSIS — Z7901 Long term (current) use of anticoagulants: Secondary | ICD-10-CM | POA: Diagnosis not present

## 2023-11-15 LAB — POCT INR: INR: 2.6 (ref 2.0–3.0)

## 2023-11-15 NOTE — Patient Instructions (Signed)
Continue taking warfarin 1 tablet daily except for 1/2 tablet on Monday and Wednesday.  Recheck INR in 4 weeks. [facility] [phone]

## 2023-11-22 DIAGNOSIS — H26491 Other secondary cataract, right eye: Secondary | ICD-10-CM | POA: Diagnosis not present

## 2023-11-24 DIAGNOSIS — E785 Hyperlipidemia, unspecified: Secondary | ICD-10-CM | POA: Diagnosis not present

## 2023-11-24 DIAGNOSIS — I10 Essential (primary) hypertension: Secondary | ICD-10-CM | POA: Diagnosis not present

## 2023-11-24 DIAGNOSIS — R54 Age-related physical debility: Secondary | ICD-10-CM | POA: Diagnosis not present

## 2023-11-24 DIAGNOSIS — I2510 Atherosclerotic heart disease of native coronary artery without angina pectoris: Secondary | ICD-10-CM | POA: Diagnosis not present

## 2023-11-24 DIAGNOSIS — I480 Paroxysmal atrial fibrillation: Secondary | ICD-10-CM | POA: Diagnosis not present

## 2023-11-24 DIAGNOSIS — E1169 Type 2 diabetes mellitus with other specified complication: Secondary | ICD-10-CM | POA: Diagnosis not present

## 2023-12-06 DIAGNOSIS — I2510 Atherosclerotic heart disease of native coronary artery without angina pectoris: Secondary | ICD-10-CM | POA: Diagnosis not present

## 2023-12-06 DIAGNOSIS — E1142 Type 2 diabetes mellitus with diabetic polyneuropathy: Secondary | ICD-10-CM | POA: Diagnosis not present

## 2023-12-06 DIAGNOSIS — I10 Essential (primary) hypertension: Secondary | ICD-10-CM | POA: Diagnosis not present

## 2023-12-06 DIAGNOSIS — I130 Hypertensive heart and chronic kidney disease with heart failure and stage 1 through stage 4 chronic kidney disease, or unspecified chronic kidney disease: Secondary | ICD-10-CM | POA: Diagnosis not present

## 2023-12-06 DIAGNOSIS — E782 Mixed hyperlipidemia: Secondary | ICD-10-CM | POA: Diagnosis not present

## 2023-12-06 DIAGNOSIS — E1165 Type 2 diabetes mellitus with hyperglycemia: Secondary | ICD-10-CM | POA: Diagnosis not present

## 2023-12-06 DIAGNOSIS — E10319 Type 1 diabetes mellitus with unspecified diabetic retinopathy without macular edema: Secondary | ICD-10-CM | POA: Diagnosis not present

## 2023-12-13 ENCOUNTER — Ambulatory Visit: Payer: HMO

## 2023-12-15 ENCOUNTER — Ambulatory Visit: Attending: Cardiovascular Disease

## 2023-12-15 DIAGNOSIS — Z7901 Long term (current) use of anticoagulants: Secondary | ICD-10-CM

## 2023-12-15 DIAGNOSIS — I480 Paroxysmal atrial fibrillation: Secondary | ICD-10-CM

## 2023-12-15 LAB — POCT INR: INR: 2.3 (ref 2.0–3.0)

## 2023-12-15 NOTE — Patient Instructions (Signed)
 Description   Continue taking warfarin 1 tablet daily except for 1/2 tablet on Mondays and Wednesdays.  Recheck INR in 4 weeks. [facility]in Clinic [phone]

## 2023-12-19 DIAGNOSIS — H6123 Impacted cerumen, bilateral: Secondary | ICD-10-CM | POA: Diagnosis not present

## 2023-12-26 DIAGNOSIS — I480 Paroxysmal atrial fibrillation: Secondary | ICD-10-CM | POA: Diagnosis not present

## 2023-12-26 DIAGNOSIS — E785 Hyperlipidemia, unspecified: Secondary | ICD-10-CM | POA: Diagnosis not present

## 2023-12-26 DIAGNOSIS — R54 Age-related physical debility: Secondary | ICD-10-CM | POA: Diagnosis not present

## 2023-12-26 DIAGNOSIS — I2510 Atherosclerotic heart disease of native coronary artery without angina pectoris: Secondary | ICD-10-CM | POA: Diagnosis not present

## 2023-12-26 DIAGNOSIS — E1169 Type 2 diabetes mellitus with other specified complication: Secondary | ICD-10-CM | POA: Diagnosis not present

## 2023-12-26 DIAGNOSIS — I10 Essential (primary) hypertension: Secondary | ICD-10-CM | POA: Diagnosis not present

## 2024-01-04 DIAGNOSIS — L84 Corns and callosities: Secondary | ICD-10-CM | POA: Diagnosis not present

## 2024-01-04 DIAGNOSIS — L97511 Non-pressure chronic ulcer of other part of right foot limited to breakdown of skin: Secondary | ICD-10-CM | POA: Diagnosis not present

## 2024-01-04 DIAGNOSIS — B351 Tinea unguium: Secondary | ICD-10-CM | POA: Diagnosis not present

## 2024-01-04 DIAGNOSIS — E1151 Type 2 diabetes mellitus with diabetic peripheral angiopathy without gangrene: Secondary | ICD-10-CM | POA: Diagnosis not present

## 2024-01-04 DIAGNOSIS — M24571 Contracture, right ankle: Secondary | ICD-10-CM | POA: Diagnosis not present

## 2024-01-04 DIAGNOSIS — I739 Peripheral vascular disease, unspecified: Secondary | ICD-10-CM | POA: Diagnosis not present

## 2024-01-04 DIAGNOSIS — M79675 Pain in left toe(s): Secondary | ICD-10-CM | POA: Diagnosis not present

## 2024-01-06 DIAGNOSIS — E785 Hyperlipidemia, unspecified: Secondary | ICD-10-CM | POA: Diagnosis not present

## 2024-01-06 DIAGNOSIS — R54 Age-related physical debility: Secondary | ICD-10-CM | POA: Diagnosis not present

## 2024-01-06 DIAGNOSIS — I5032 Chronic diastolic (congestive) heart failure: Secondary | ICD-10-CM | POA: Diagnosis not present

## 2024-01-06 DIAGNOSIS — E1169 Type 2 diabetes mellitus with other specified complication: Secondary | ICD-10-CM | POA: Diagnosis not present

## 2024-01-10 ENCOUNTER — Ambulatory Visit: Attending: Cardiology

## 2024-01-10 DIAGNOSIS — I480 Paroxysmal atrial fibrillation: Secondary | ICD-10-CM | POA: Diagnosis not present

## 2024-01-10 DIAGNOSIS — Z7901 Long term (current) use of anticoagulants: Secondary | ICD-10-CM

## 2024-01-10 LAB — POCT INR: INR: 2.1 (ref 2.0–3.0)

## 2024-01-10 NOTE — Patient Instructions (Signed)
 Take 1.5 tablets today only then Continue taking warfarin 1 tablet daily except for 1/2 tablet on Mondays and Wednesdays.  Recheck INR in 6 weeks. Coumadin Clinic [phone]

## 2024-01-11 DIAGNOSIS — I890 Lymphedema, not elsewhere classified: Secondary | ICD-10-CM | POA: Diagnosis not present

## 2024-01-11 DIAGNOSIS — I83893 Varicose veins of bilateral lower extremities with other complications: Secondary | ICD-10-CM | POA: Diagnosis not present

## 2024-01-11 DIAGNOSIS — M79661 Pain in right lower leg: Secondary | ICD-10-CM | POA: Diagnosis not present

## 2024-01-11 DIAGNOSIS — M79662 Pain in left lower leg: Secondary | ICD-10-CM | POA: Diagnosis not present

## 2024-01-12 DIAGNOSIS — R54 Age-related physical debility: Secondary | ICD-10-CM | POA: Diagnosis not present

## 2024-01-12 DIAGNOSIS — E1169 Type 2 diabetes mellitus with other specified complication: Secondary | ICD-10-CM | POA: Diagnosis not present

## 2024-01-12 DIAGNOSIS — I10 Essential (primary) hypertension: Secondary | ICD-10-CM | POA: Diagnosis not present

## 2024-01-12 DIAGNOSIS — I2510 Atherosclerotic heart disease of native coronary artery without angina pectoris: Secondary | ICD-10-CM | POA: Diagnosis not present

## 2024-01-12 DIAGNOSIS — E785 Hyperlipidemia, unspecified: Secondary | ICD-10-CM | POA: Diagnosis not present

## 2024-01-12 DIAGNOSIS — I480 Paroxysmal atrial fibrillation: Secondary | ICD-10-CM | POA: Diagnosis not present

## 2024-01-18 DIAGNOSIS — I890 Lymphedema, not elsewhere classified: Secondary | ICD-10-CM | POA: Diagnosis not present

## 2024-01-21 ENCOUNTER — Other Ambulatory Visit: Payer: Self-pay | Admitting: Cardiovascular Disease

## 2024-01-21 DIAGNOSIS — I480 Paroxysmal atrial fibrillation: Secondary | ICD-10-CM

## 2024-01-23 DIAGNOSIS — I130 Hypertensive heart and chronic kidney disease with heart failure and stage 1 through stage 4 chronic kidney disease, or unspecified chronic kidney disease: Secondary | ICD-10-CM | POA: Diagnosis not present

## 2024-01-23 DIAGNOSIS — E782 Mixed hyperlipidemia: Secondary | ICD-10-CM | POA: Diagnosis not present

## 2024-01-23 DIAGNOSIS — E1165 Type 2 diabetes mellitus with hyperglycemia: Secondary | ICD-10-CM | POA: Diagnosis not present

## 2024-01-23 DIAGNOSIS — I10 Essential (primary) hypertension: Secondary | ICD-10-CM | POA: Diagnosis not present

## 2024-01-23 DIAGNOSIS — I129 Hypertensive chronic kidney disease with stage 1 through stage 4 chronic kidney disease, or unspecified chronic kidney disease: Secondary | ICD-10-CM | POA: Diagnosis not present

## 2024-01-23 NOTE — Telephone Encounter (Signed)
 Refill request for warfarin:  Last INR was 2.1 on 01/10/24 Next INR due 02/21/24 LOV was 09/13/23  Refill approved.

## 2024-01-24 DIAGNOSIS — I890 Lymphedema, not elsewhere classified: Secondary | ICD-10-CM | POA: Diagnosis not present

## 2024-01-31 DIAGNOSIS — I890 Lymphedema, not elsewhere classified: Secondary | ICD-10-CM | POA: Diagnosis not present

## 2024-02-14 DIAGNOSIS — M7731 Calcaneal spur, right foot: Secondary | ICD-10-CM | POA: Diagnosis not present

## 2024-02-14 DIAGNOSIS — M19071 Primary osteoarthritis, right ankle and foot: Secondary | ICD-10-CM | POA: Diagnosis not present

## 2024-02-21 ENCOUNTER — Ambulatory Visit

## 2024-02-22 DIAGNOSIS — I739 Peripheral vascular disease, unspecified: Secondary | ICD-10-CM | POA: Diagnosis not present

## 2024-02-22 DIAGNOSIS — M24572 Contracture, left ankle: Secondary | ICD-10-CM | POA: Diagnosis not present

## 2024-02-22 DIAGNOSIS — M7661 Achilles tendinitis, right leg: Secondary | ICD-10-CM | POA: Diagnosis not present

## 2024-02-22 DIAGNOSIS — L84 Corns and callosities: Secondary | ICD-10-CM | POA: Diagnosis not present

## 2024-02-22 DIAGNOSIS — E1151 Type 2 diabetes mellitus with diabetic peripheral angiopathy without gangrene: Secondary | ICD-10-CM | POA: Diagnosis not present

## 2024-02-22 DIAGNOSIS — M24571 Contracture, right ankle: Secondary | ICD-10-CM | POA: Diagnosis not present

## 2024-02-24 DIAGNOSIS — E785 Hyperlipidemia, unspecified: Secondary | ICD-10-CM | POA: Diagnosis not present

## 2024-02-24 DIAGNOSIS — I480 Paroxysmal atrial fibrillation: Secondary | ICD-10-CM | POA: Diagnosis not present

## 2024-02-24 DIAGNOSIS — I10 Essential (primary) hypertension: Secondary | ICD-10-CM | POA: Diagnosis not present

## 2024-02-24 DIAGNOSIS — I890 Lymphedema, not elsewhere classified: Secondary | ICD-10-CM | POA: Diagnosis not present

## 2024-02-24 DIAGNOSIS — I2510 Atherosclerotic heart disease of native coronary artery without angina pectoris: Secondary | ICD-10-CM | POA: Diagnosis not present

## 2024-02-24 DIAGNOSIS — E1169 Type 2 diabetes mellitus with other specified complication: Secondary | ICD-10-CM | POA: Diagnosis not present

## 2024-02-24 DIAGNOSIS — R54 Age-related physical debility: Secondary | ICD-10-CM | POA: Diagnosis not present

## 2024-03-02 ENCOUNTER — Ambulatory Visit: Attending: Cardiovascular Disease

## 2024-03-02 DIAGNOSIS — Z7901 Long term (current) use of anticoagulants: Secondary | ICD-10-CM

## 2024-03-02 DIAGNOSIS — I480 Paroxysmal atrial fibrillation: Secondary | ICD-10-CM | POA: Diagnosis not present

## 2024-03-02 LAB — POCT INR: INR: 2.3 (ref 2.0–3.0)

## 2024-03-02 NOTE — Patient Instructions (Signed)
 Continue taking warfarin 1 tablet daily except for 1/2 tablet on Mondays and Wednesdays.  Recheck INR in 6 weeks. Coumadin  Clinic [phone]

## 2024-03-07 DIAGNOSIS — Z85118 Personal history of other malignant neoplasm of bronchus and lung: Secondary | ICD-10-CM | POA: Diagnosis not present

## 2024-03-07 DIAGNOSIS — I119 Hypertensive heart disease without heart failure: Secondary | ICD-10-CM | POA: Diagnosis not present

## 2024-03-07 DIAGNOSIS — E785 Hyperlipidemia, unspecified: Secondary | ICD-10-CM | POA: Diagnosis not present

## 2024-03-07 DIAGNOSIS — E1151 Type 2 diabetes mellitus with diabetic peripheral angiopathy without gangrene: Secondary | ICD-10-CM | POA: Diagnosis not present

## 2024-03-07 DIAGNOSIS — Z794 Long term (current) use of insulin: Secondary | ICD-10-CM | POA: Diagnosis not present

## 2024-03-07 DIAGNOSIS — I8393 Asymptomatic varicose veins of bilateral lower extremities: Secondary | ICD-10-CM | POA: Diagnosis not present

## 2024-03-07 DIAGNOSIS — Z7901 Long term (current) use of anticoagulants: Secondary | ICD-10-CM | POA: Diagnosis not present

## 2024-03-07 DIAGNOSIS — I2510 Atherosclerotic heart disease of native coronary artery without angina pectoris: Secondary | ICD-10-CM | POA: Diagnosis not present

## 2024-03-07 DIAGNOSIS — H539 Unspecified visual disturbance: Secondary | ICD-10-CM | POA: Diagnosis not present

## 2024-03-07 DIAGNOSIS — M24571 Contracture, right ankle: Secondary | ICD-10-CM | POA: Diagnosis not present

## 2024-03-07 DIAGNOSIS — Z951 Presence of aortocoronary bypass graft: Secondary | ICD-10-CM | POA: Diagnosis not present

## 2024-03-07 DIAGNOSIS — M7661 Achilles tendinitis, right leg: Secondary | ICD-10-CM | POA: Diagnosis not present

## 2024-03-07 DIAGNOSIS — Z90710 Acquired absence of both cervix and uterus: Secondary | ICD-10-CM | POA: Diagnosis not present

## 2024-03-07 DIAGNOSIS — Z87891 Personal history of nicotine dependence: Secondary | ICD-10-CM | POA: Diagnosis not present

## 2024-03-07 DIAGNOSIS — Z9181 History of falling: Secondary | ICD-10-CM | POA: Diagnosis not present

## 2024-03-07 DIAGNOSIS — I4891 Unspecified atrial fibrillation: Secondary | ICD-10-CM | POA: Diagnosis not present

## 2024-03-07 DIAGNOSIS — L84 Corns and callosities: Secondary | ICD-10-CM | POA: Diagnosis not present

## 2024-03-07 DIAGNOSIS — M24572 Contracture, left ankle: Secondary | ICD-10-CM | POA: Diagnosis not present

## 2024-03-15 DIAGNOSIS — I130 Hypertensive heart and chronic kidney disease with heart failure and stage 1 through stage 4 chronic kidney disease, or unspecified chronic kidney disease: Secondary | ICD-10-CM | POA: Diagnosis not present

## 2024-03-15 DIAGNOSIS — R5383 Other fatigue: Secondary | ICD-10-CM | POA: Diagnosis not present

## 2024-03-15 DIAGNOSIS — E782 Mixed hyperlipidemia: Secondary | ICD-10-CM | POA: Diagnosis not present

## 2024-03-15 DIAGNOSIS — N1831 Chronic kidney disease, stage 3a: Secondary | ICD-10-CM | POA: Diagnosis not present

## 2024-03-15 DIAGNOSIS — D6869 Other thrombophilia: Secondary | ICD-10-CM | POA: Diagnosis not present

## 2024-03-15 DIAGNOSIS — I2510 Atherosclerotic heart disease of native coronary artery without angina pectoris: Secondary | ICD-10-CM | POA: Diagnosis not present

## 2024-03-15 DIAGNOSIS — E1142 Type 2 diabetes mellitus with diabetic polyneuropathy: Secondary | ICD-10-CM | POA: Diagnosis not present

## 2024-03-16 DIAGNOSIS — N1831 Chronic kidney disease, stage 3a: Secondary | ICD-10-CM | POA: Diagnosis not present

## 2024-03-16 DIAGNOSIS — E782 Mixed hyperlipidemia: Secondary | ICD-10-CM | POA: Diagnosis not present

## 2024-03-16 DIAGNOSIS — I2510 Atherosclerotic heart disease of native coronary artery without angina pectoris: Secondary | ICD-10-CM | POA: Diagnosis not present

## 2024-03-16 DIAGNOSIS — E1142 Type 2 diabetes mellitus with diabetic polyneuropathy: Secondary | ICD-10-CM | POA: Diagnosis not present

## 2024-03-16 DIAGNOSIS — I130 Hypertensive heart and chronic kidney disease with heart failure and stage 1 through stage 4 chronic kidney disease, or unspecified chronic kidney disease: Secondary | ICD-10-CM | POA: Diagnosis not present

## 2024-03-16 DIAGNOSIS — R5383 Other fatigue: Secondary | ICD-10-CM | POA: Diagnosis not present

## 2024-03-20 DIAGNOSIS — I2510 Atherosclerotic heart disease of native coronary artery without angina pectoris: Secondary | ICD-10-CM | POA: Diagnosis not present

## 2024-03-20 DIAGNOSIS — E10319 Type 1 diabetes mellitus with unspecified diabetic retinopathy without macular edema: Secondary | ICD-10-CM | POA: Diagnosis not present

## 2024-03-20 DIAGNOSIS — I4821 Permanent atrial fibrillation: Secondary | ICD-10-CM | POA: Diagnosis not present

## 2024-03-20 DIAGNOSIS — D6869 Other thrombophilia: Secondary | ICD-10-CM | POA: Diagnosis not present

## 2024-03-20 DIAGNOSIS — I130 Hypertensive heart and chronic kidney disease with heart failure and stage 1 through stage 4 chronic kidney disease, or unspecified chronic kidney disease: Secondary | ICD-10-CM | POA: Diagnosis not present

## 2024-03-20 DIAGNOSIS — E782 Mixed hyperlipidemia: Secondary | ICD-10-CM | POA: Diagnosis not present

## 2024-03-20 DIAGNOSIS — D692 Other nonthrombocytopenic purpura: Secondary | ICD-10-CM | POA: Diagnosis not present

## 2024-03-20 DIAGNOSIS — Z0000 Encounter for general adult medical examination without abnormal findings: Secondary | ICD-10-CM | POA: Diagnosis not present

## 2024-03-20 DIAGNOSIS — N1831 Chronic kidney disease, stage 3a: Secondary | ICD-10-CM | POA: Diagnosis not present

## 2024-03-20 DIAGNOSIS — I10 Essential (primary) hypertension: Secondary | ICD-10-CM | POA: Diagnosis not present

## 2024-03-20 DIAGNOSIS — I129 Hypertensive chronic kidney disease with stage 1 through stage 4 chronic kidney disease, or unspecified chronic kidney disease: Secondary | ICD-10-CM | POA: Diagnosis not present

## 2024-03-26 DIAGNOSIS — I890 Lymphedema, not elsewhere classified: Secondary | ICD-10-CM | POA: Diagnosis not present

## 2024-04-04 DIAGNOSIS — I890 Lymphedema, not elsewhere classified: Secondary | ICD-10-CM | POA: Diagnosis not present

## 2024-04-04 DIAGNOSIS — R600 Localized edema: Secondary | ICD-10-CM | POA: Diagnosis not present

## 2024-04-06 ENCOUNTER — Ambulatory Visit: Admitting: Podiatry

## 2024-04-10 ENCOUNTER — Ambulatory Visit

## 2024-04-13 ENCOUNTER — Ambulatory Visit: Attending: Cardiovascular Disease | Admitting: *Deleted

## 2024-04-13 DIAGNOSIS — I480 Paroxysmal atrial fibrillation: Secondary | ICD-10-CM

## 2024-04-13 DIAGNOSIS — Z7901 Long term (current) use of anticoagulants: Secondary | ICD-10-CM

## 2024-04-13 LAB — POCT INR: INR: 2.2 (ref 2.0–3.0)

## 2024-04-13 NOTE — Patient Instructions (Signed)
 Description   Continue taking warfarin 1 tablet daily except for 1/2 tablet on Mondays and Wednesdays.  Recheck INR in 6 weeks. Coumadin  Clinic [phone]

## 2024-04-13 NOTE — Progress Notes (Signed)
 INR-2.2 Please see anticoagulation encounter

## 2024-04-20 ENCOUNTER — Ambulatory Visit: Admitting: Podiatry

## 2024-04-20 ENCOUNTER — Ambulatory Visit (INDEPENDENT_AMBULATORY_CARE_PROVIDER_SITE_OTHER)

## 2024-04-20 ENCOUNTER — Encounter: Payer: Self-pay | Admitting: Podiatry

## 2024-04-20 DIAGNOSIS — M7732 Calcaneal spur, left foot: Secondary | ICD-10-CM

## 2024-04-20 DIAGNOSIS — M7661 Achilles tendinitis, right leg: Secondary | ICD-10-CM | POA: Diagnosis not present

## 2024-04-20 DIAGNOSIS — M7662 Achilles tendinitis, left leg: Secondary | ICD-10-CM

## 2024-04-20 DIAGNOSIS — M7731 Calcaneal spur, right foot: Secondary | ICD-10-CM

## 2024-04-20 NOTE — Patient Instructions (Signed)

## 2024-04-20 NOTE — Progress Notes (Signed)
 Chief Complaint  Patient presents with   Heel Spurs    Bilateral heel spurs. Original imaging was completed by [facility]. New Xrays up. Was told she has heel spurs and achilles tendinitis. She is diabetic, last A1c was 7.7 in June and takes cumidine.    HPI: 73 y.o. female presents today with concern for bilateral posterior heel pain. She has been told she has heel spurs previously.  She presents using a wheelchair.  She reports minimal ambulation.  She does have extensive medical history as discussed below including PAD with intervention, history of MI, CHF, diabetes, lymphedema.  Last A1c around 7.7.  Past Medical History:  Diagnosis Date   Arthritis    knees (02/05/2016)   Cancer (HCC) 5/10   LUL Vats    CHF (congestive heart failure) (HCC)    Coronary artery disease    GERD (gastroesophageal reflux disease)    tx meds   H/O hiatal hernia    History of blood transfusion several   last april 2015 s/p knee surgery (02/05/2016)   Hypercholesteremia    Hypertension    Lung cancer (HCC)    2010, surgery 18% left lung no radiation or chemo   Non-STEMI (non-ST elevated myocardial infarction) (HCC) 04/07/2012   See cath results below   Paroxysmal atrial fibrillation (HCC) 04/07/2012   On Warfarin   Peripheral vascular disease (HCC) 8/12   Lt SFA PTA   Presence of stent in LAD coronary artery 04/07/2012   Xience Expedition DES 2.75 mm x 18 mm (dilated to 3.0 mm)   Sleep apnea    does not wear CPAP   Type II diabetes mellitus (HCC)    insulin  dependent    Past Surgical History:  Procedure Laterality Date   ABDOMINAL HYSTERECTOMY  1990   BREAST EXCISIONAL BIOPSY Right    CARDIAC CATHETERIZATION  11/04/2008   patent RCA, LM, and Circ, nl EF   CARDIAC CATHETERIZATION  02/20/2002   patent coronaries with the only abnormality being a smooth luminla irregularity in the mid intermediate ramus branch no felt to be hemodynamically significant, nl LV   CARDIAC CATHETERIZATION N/A  02/05/2016   Procedure: Left Heart Cath and Coronary Angiography;  Surgeon: Alm LELON Clay, MD;  Location: [facility] INVASIVE CV LAB;  Service: Cardiovascular;  Laterality: N/A;   CARDIAC CATHETERIZATION N/A 02/05/2016   Procedure: Coronary Stent Intervention;  Surgeon: Alm LELON Clay, MD;  Location: [facility] INVASIVE CV LAB;  Service: Cardiovascular;  Laterality: N/A;   CARDIOVERSION N/A 07/16/2021   Procedure: CARDIOVERSION;  Surgeon: Santo Stanly LABOR, MD;  Location: [facility] ENDOSCOPY;  Service: Cardiovascular;  Laterality: N/A;   CATARACT EXTRACTION W/ INTRAOCULAR LENS  IMPLANT, BILATERAL  2013   CORONARY ANGIOPLASTY WITH STENT PLACEMENT  04/07/2012   Xience Expedition DES 2.40mm x 18 mm (dilated to 3.0 mm) to the prox LAD    CORONARY ANGIOPLASTY WITH STENT PLACEMENT  2013; 2015   CORONARY ARTERY BYPASS GRAFT N/A 08/17/2017   Procedure: CORONARY ARTERY BYPASS GRAFTING (CABG) TIMES 1 USING LEFT INTERNAL MAMMARY ARTERY;  Surgeon: Kerrin Elspeth BROCKS, MD;  Location: [facility] OR;  Service: Open Heart Surgery;  Laterality: N/A;   DILATION AND CURETTAGE OF UTERUS  <1990   ESOPHAGOGASTRODUODENOSCOPY N/A 05/23/2015   Procedure: ESOPHAGOGASTRODUODENOSCOPY (EGD);  Surgeon: Belvie Just, MD;  Location: [facility];  Service: Endoscopy;  Laterality: N/A;   ESOPHAGOGASTRODUODENOSCOPY (EGD) WITH PROPOFOL  N/A 06/13/2015   Procedure: ESOPHAGOGASTRODUODENOSCOPY (EGD) WITH PROPOFOL ;  Surgeon: Belvie Just, MD;  Location:  [facility]OSCOPY;  Service: Endoscopy;  Laterality: N/A;   ESOPHAGOGASTRODUODENOSCOPY (EGD) WITH PROPOFOL  N/A 04/07/2018   Procedure: ESOPHAGOGASTRODUODENOSCOPY (EGD) WITH PROPOFOL ;  Surgeon: Rollin Dover, MD;  Location: [facility];  Service: Endoscopy;  Laterality: N/A;  fluoroscopy is needed   FRACTURE SURGERY     HAMMER TOE SURGERY Bilateral 1993   with screws, on screw removed   JOINT REPLACEMENT     KNEE ARTHROSCOPY Bilateral    left x2, right x1   LAPAROSCOPIC CHOLECYSTECTOMY     LEFT HEART CATH AND  CORONARY ANGIOGRAPHY N/A 08/11/2017   Procedure: LEFT HEART CATH AND CORONARY ANGIOGRAPHY;  Surgeon: Swaziland, Peter M, MD;  Location: [facility] INVASIVE CV LAB;  Service: Cardiovascular;  Laterality: N/A;   LEFT HEART CATHETERIZATION WITH CORONARY ANGIOGRAM N/A 04/07/2012   Procedure: LEFT HEART CATHETERIZATION WITH CORONARY ANGIOGRAM;  Surgeon: Dorn JINNY Lesches, MD;  Location: [facility] CATH LAB;  Service: Cardiovascular;  Laterality: N/A;   LEFT HEART CATHETERIZATION WITH CORONARY ANGIOGRAM N/A 05/27/2014   Procedure: LEFT HEART CATHETERIZATION WITH CORONARY ANGIOGRAM;  Surgeon: Dorn JINNY Lesches, MD; LAD 99% ISR, CFX 50-60%, RCA (dominant) no sig dz, EF 60%   LOWER EXTREMITY ANGIOGRAM  05/17/11   directional atherectomy to the prox L SFA using a LX Man TurboHawk, ballooned with a [place] balloon    ORIF ANKLE FRACTURE  07/09/2012   Procedure: OPEN REDUCTION INTERNAL FIXATION (ORIF) ANKLE FRACTURE;  Surgeon: Cordella Glendia Hutchinson, MD;  Location: [facility]S;  Service: Orthopedics;  Laterality: Left;  open reduction internal fixation trimalleolar ankle fracture medial malleolous fixation   PERCUTANEOUS CORONARY STENT INTERVENTION (PCI-S) N/A 04/07/2012   Procedure: PERCUTANEOUS CORONARY STENT INTERVENTION (PCI-S);  Surgeon: Dorn JINNY Lesches, MD;  Location: [facility] CATH LAB;  Service: Cardiovascular;  Laterality: N/A;   PERCUTANEOUS CORONARY STENT INTERVENTION (PCI-S)  05/27/2014   Procedure: PERCUTANEOUS CORONARY STENT INTERVENTION (PCI-S);  Surgeon: Dorn JINNY Lesches, MD; 3 mm x 12 mm long Xience Xpedition DES to the proximal LAD   SAVORY DILATION N/A 06/13/2015   Procedure: SAVORY DILATION;  Surgeon: Dover Rollin, MD;  Location: [facility];  Service: Endoscopy;  Laterality: N/A;   SAVORY DILATION N/A 04/07/2018   Procedure: SAVORY DILATION;  Surgeon: Rollin Dover, MD;  Location: [facility];  Service: Endoscopy;  Laterality: N/A;   TEE WITHOUT CARDIOVERSION N/A 08/17/2017   Procedure: TRANSESOPHAGEAL ECHOCARDIOGRAM (TEE);   Surgeon: Kerrin Elspeth BROCKS, MD;  Location: [facility] OR;  Service: Open Heart Surgery;  Laterality: N/A;   TEE WITHOUT CARDIOVERSION N/A 07/16/2021   Procedure: TRANSESOPHAGEAL ECHOCARDIOGRAM (TEE);  Surgeon: Santo Stanly LABOR, MD;  Location: [facility] ENDOSCOPY;  Service: Cardiovascular;  Laterality: N/A;   TONSILLECTOMY     TOTAL KNEE ARTHROPLASTY Left 2003   TOTAL KNEE REVISION Left 11/05/2013   Procedure: LEFT TOTAL KNEE RESECTION;  Surgeon: Donnice JONETTA Car, MD;  Location: [facility];  Service: Orthopedics;  Laterality: Left;   TOTAL KNEE REVISION Left 2007   opened in 2006 and cleaned out, 2007 revision   TOTAL KNEE REVISION Left 12/31/2013   Procedure: RE-INPLANTATION LEFT TOTAL KNEE ;  Surgeon: Donnice JONETTA Car, MD;  Location: [facility];  Service: Orthopedics;  Laterality: Left;   VIDEO ASSISTED THORACOSCOPY (VATS)/ LOBECTOMY  01/2009    Allergies  Allergen Reactions   Levofloxacin Itching   Morphine  And Codeine Itching and Other (See Comments)    Pt reports oxycodone  history with no allergy.    ROS    Physical Exam: There were no vitals filed for this visit.  General: The patient is alert and oriented x3 in no acute distress.  Dermatology: Changes to skin of lower legs consistent with chronic venous stasis dermatitis and lymphedema.  Nailplates to have some thickened, yellow appearance but appear well-maintained overall.  Vascular: Difficult to palpate DP and PT pulses due to extent of edema.  Feet are warm and well-perfused.  Pedal hair growth absent.  Capillary refill 3 seconds to the digits.  +1 pitting edema present significant nonpitting edema bilaterally.  Neurological: Protective sensation decreased.  Musculoskeletal Exam: Muscle strength decreased bilaterally, 4/5 in dorsiflexion, plantarflexion, inversion, eversion.  There is decreased ankle joint range of motion, subtalar joint range of motion, lower extremities as a whole or stiff.  Tenderness on palpation of posterior heels  bilaterally.  Tenderness on palpation of distal Achilles tendon, no pain in watershed area.  No Achilles tendon thickening noted.  Radiographic Exam: Left and right foot radiographs 3 views 04/20/2024  No fractures or acute osseous irregularities noted.  Left foot ankle hardware in place from prior ORIF.  Decreased tibiotalar joint space seen.  Generalized osteopenia.  Spurring present posterior calcaneus with calcifications noted within the Achilles tendon left foot.  Right side similar findings of generalized osteopenia, there does appear to be some ankle arthritis and hindfoot arthritis as well.  Calcaneal spurring present posteriorly.  Vascular calcifications present.  Assessment/Plan of Care: 1. Heel spur, right   2. Heel spur, left   3. Achilles tendinitis of both lower extremities      No orders of the defined types were placed in this encounter.  None  Discussed clinical findings with patient today.  Radiographs reviewed with patient.  Given her comorbidities, there is little that can be done as far as oral medication is concerned.  Did discuss findings of Achilles tendinitis with posterior calcaneal spurring.  She does report minimal ambulation.  I did did advise that she can perform some ankle stretches to increase ankle joint dorsiflexion using bands, towels or belts.  She can perform other ankle joint range of motion and Achilles stretching range of motion to the best of her ability.  Also advised that she attempt ambulate 3-4 times a week for 15 to 20 minutes at a time.  Instructed patient to keep posterior heels offloaded and to try padding for her shoes which may decrease pain to posterior calcaneus.  Will have her follow-up for diabetic footcare.   Felita Bump L. Lamount MAUL, AACFAS [facility]     [address] [place], [place] [address]                Office [phone]  Fax [phone]

## 2024-04-23 ENCOUNTER — Ambulatory Visit: Admitting: Podiatry

## 2024-04-25 DIAGNOSIS — I890 Lymphedema, not elsewhere classified: Secondary | ICD-10-CM | POA: Diagnosis not present

## 2024-05-21 DIAGNOSIS — L84 Corns and callosities: Secondary | ICD-10-CM | POA: Diagnosis not present

## 2024-05-21 DIAGNOSIS — M79675 Pain in left toe(s): Secondary | ICD-10-CM | POA: Diagnosis not present

## 2024-05-21 DIAGNOSIS — M722 Plantar fascial fibromatosis: Secondary | ICD-10-CM | POA: Diagnosis not present

## 2024-05-21 DIAGNOSIS — E1151 Type 2 diabetes mellitus with diabetic peripheral angiopathy without gangrene: Secondary | ICD-10-CM | POA: Diagnosis not present

## 2024-05-21 DIAGNOSIS — B351 Tinea unguium: Secondary | ICD-10-CM | POA: Diagnosis not present

## 2024-05-21 DIAGNOSIS — I739 Peripheral vascular disease, unspecified: Secondary | ICD-10-CM | POA: Diagnosis not present

## 2024-05-21 DIAGNOSIS — M24571 Contracture, right ankle: Secondary | ICD-10-CM | POA: Diagnosis not present

## 2024-05-21 DIAGNOSIS — M7661 Achilles tendinitis, right leg: Secondary | ICD-10-CM | POA: Diagnosis not present

## 2024-05-22 ENCOUNTER — Encounter

## 2024-05-23 ENCOUNTER — Ambulatory Visit: Attending: Cardiovascular Disease | Admitting: *Deleted

## 2024-05-23 DIAGNOSIS — Z7901 Long term (current) use of anticoagulants: Secondary | ICD-10-CM

## 2024-05-23 DIAGNOSIS — I480 Paroxysmal atrial fibrillation: Secondary | ICD-10-CM

## 2024-05-23 LAB — POCT INR: INR: 3.1 — AB (ref 2.0–3.0)

## 2024-05-23 NOTE — Patient Instructions (Addendum)
 Description   INR-3.1; Do not take any warfarin today then continue taking warfarin 1 tablet daily except for 1/2 tablet on Mondays and Wednesdays.  Recheck INR in 6 weeks. Coumadin  Clinic [phone]

## 2024-05-23 NOTE — Progress Notes (Signed)
 Description   INR-3.1; Do not take any warfarin today then continue taking warfarin 1 tablet daily except for 1/2 tablet on Mondays and Wednesdays.  Recheck INR in 6 weeks. Coumadin  Clinic [phone]

## 2024-05-26 DIAGNOSIS — I890 Lymphedema, not elsewhere classified: Secondary | ICD-10-CM | POA: Diagnosis not present

## 2024-06-05 ENCOUNTER — Other Ambulatory Visit: Payer: Self-pay | Admitting: Cardiovascular Disease

## 2024-06-05 DIAGNOSIS — I480 Paroxysmal atrial fibrillation: Secondary | ICD-10-CM

## 2024-06-08 ENCOUNTER — Encounter: Payer: Self-pay | Admitting: Podiatry

## 2024-06-08 ENCOUNTER — Ambulatory Visit (INDEPENDENT_AMBULATORY_CARE_PROVIDER_SITE_OTHER): Admitting: Podiatry

## 2024-06-08 VITALS — Ht 63.0 in | Wt 268.0 lb

## 2024-06-08 DIAGNOSIS — M722 Plantar fascial fibromatosis: Secondary | ICD-10-CM

## 2024-06-08 DIAGNOSIS — M7662 Achilles tendinitis, left leg: Secondary | ICD-10-CM

## 2024-06-08 DIAGNOSIS — M7661 Achilles tendinitis, right leg: Secondary | ICD-10-CM

## 2024-06-08 MED ORDER — TRIAMCINOLONE ACETONIDE 10 MG/ML IJ SUSP
10.0000 mg | Freq: Once | INTRAMUSCULAR | Status: AC
  Administered 2024-06-08: 10 mg

## 2024-06-08 NOTE — Progress Notes (Signed)
 Chief Complaint  Patient presents with   Foot Pain    Bilateral foot pain very painful where arch and heel connect. Pain 10. R foot is a little worse. Hurts with shoes on or off.  Diabetic A1c 7.7.  Coumadin      HPI: 73 y.o. female presents today following up for bilateral heel pain.  Today the pain is more located on the plantar aspect of the heels, previously was seen more for insertional Achilles tendinitis with posterior heel spurring.  She is very sensitive to any touch and started today.  Right slightly worse than left.  Minimal to no ambulation and primarily uses wheelchair.  Past Medical History:  Diagnosis Date   Arthritis    knees (02/05/2016)   Cancer (HCC) 5/10   LUL Vats    CHF (congestive heart failure) (HCC)    Coronary artery disease    GERD (gastroesophageal reflux disease)    tx meds   H/O hiatal hernia    History of blood transfusion several   last april 2015 s/p knee surgery (02/05/2016)   Hypercholesteremia    Hypertension    Lung cancer (HCC)    2010, surgery 18% left lung no radiation or chemo   Non-STEMI (non-ST elevated myocardial infarction) (HCC) 04/07/2012   See cath results below   Paroxysmal atrial fibrillation (HCC) 04/07/2012   On Warfarin   Peripheral vascular disease (HCC) 8/12   Lt SFA PTA   Presence of stent in LAD coronary artery 04/07/2012   Xience Expedition DES 2.75 mm x 18 mm (dilated to 3.0 mm)   Sleep apnea    does not wear CPAP   Type II diabetes mellitus (HCC)    insulin  dependent    Past Surgical History:  Procedure Laterality Date   ABDOMINAL HYSTERECTOMY  1990   BREAST EXCISIONAL BIOPSY Right    CARDIAC CATHETERIZATION  11/04/2008   patent RCA, LM, and Circ, nl EF   CARDIAC CATHETERIZATION  02/20/2002   patent coronaries with the only abnormality being a smooth luminla irregularity in the mid intermediate ramus branch no felt to be hemodynamically significant, nl LV   CARDIAC CATHETERIZATION N/A 02/05/2016    Procedure: Left Heart Cath and Coronary Angiography;  Surgeon: Alm LELON Clay, MD;  Location: [facility] INVASIVE CV LAB;  Service: Cardiovascular;  Laterality: N/A;   CARDIAC CATHETERIZATION N/A 02/05/2016   Procedure: Coronary Stent Intervention;  Surgeon: Alm LELON Clay, MD;  Location: [facility] INVASIVE CV LAB;  Service: Cardiovascular;  Laterality: N/A;   CARDIOVERSION N/A 07/16/2021   Procedure: CARDIOVERSION;  Surgeon: Santo Stanly LABOR, MD;  Location: [facility] ENDOSCOPY;  Service: Cardiovascular;  Laterality: N/A;   CATARACT EXTRACTION W/ INTRAOCULAR LENS  IMPLANT, BILATERAL  2013   CORONARY ANGIOPLASTY WITH STENT PLACEMENT  04/07/2012   Xience Expedition DES 2.58mm x 18 mm (dilated to 3.0 mm) to the prox LAD    CORONARY ANGIOPLASTY WITH STENT PLACEMENT  2013; 2015   CORONARY ARTERY BYPASS GRAFT N/A 08/17/2017   Procedure: CORONARY ARTERY BYPASS GRAFTING (CABG) TIMES 1 USING LEFT INTERNAL MAMMARY ARTERY;  Surgeon: Kerrin Elspeth BROCKS, MD;  Location: [facility] OR;  Service: Open Heart Surgery;  Laterality: N/A;   DILATION AND CURETTAGE OF UTERUS  <1990   ESOPHAGOGASTRODUODENOSCOPY N/A 05/23/2015   Procedure: ESOPHAGOGASTRODUODENOSCOPY (EGD);  Surgeon: Belvie Just, MD;  Location: [facility];  Service: Endoscopy;  Laterality: N/A;   ESOPHAGOGASTRODUODENOSCOPY (EGD) WITH PROPOFOL  N/A 06/13/2015   Procedure: ESOPHAGOGASTRODUODENOSCOPY (EGD) WITH PROPOFOL ;  Surgeon: Belvie  Rollin, MD;  Location: [facility];  Service: Endoscopy;  Laterality: N/A;   ESOPHAGOGASTRODUODENOSCOPY (EGD) WITH PROPOFOL  N/A 04/07/2018   Procedure: ESOPHAGOGASTRODUODENOSCOPY (EGD) WITH PROPOFOL ;  Surgeon: Rollin Dover, MD;  Location: [facility];  Service: Endoscopy;  Laterality: N/A;  fluoroscopy is needed   FRACTURE SURGERY     HAMMER TOE SURGERY Bilateral 1993   with screws, on screw removed   JOINT REPLACEMENT     KNEE ARTHROSCOPY Bilateral    left x2, right x1   LAPAROSCOPIC CHOLECYSTECTOMY     LEFT HEART CATH AND CORONARY  ANGIOGRAPHY N/A 08/11/2017   Procedure: LEFT HEART CATH AND CORONARY ANGIOGRAPHY;  Surgeon: Swaziland, Peter M, MD;  Location: [facility] INVASIVE CV LAB;  Service: Cardiovascular;  Laterality: N/A;   LEFT HEART CATHETERIZATION WITH CORONARY ANGIOGRAM N/A 04/07/2012   Procedure: LEFT HEART CATHETERIZATION WITH CORONARY ANGIOGRAM;  Surgeon: Dorn JINNY Lesches, MD;  Location: [facility] CATH LAB;  Service: Cardiovascular;  Laterality: N/A;   LEFT HEART CATHETERIZATION WITH CORONARY ANGIOGRAM N/A 05/27/2014   Procedure: LEFT HEART CATHETERIZATION WITH CORONARY ANGIOGRAM;  Surgeon: Dorn JINNY Lesches, MD; LAD 99% ISR, CFX 50-60%, RCA (dominant) no sig dz, EF 60%   LOWER EXTREMITY ANGIOGRAM  05/17/11   directional atherectomy to the prox L SFA using a LX Man TurboHawk, ballooned with a [place] balloon    ORIF ANKLE FRACTURE  07/09/2012   Procedure: OPEN REDUCTION INTERNAL FIXATION (ORIF) ANKLE FRACTURE;  Surgeon: Cordella Glendia Hutchinson, MD;  Location: [facility];  Service: Orthopedics;  Laterality: Left;  open reduction internal fixation trimalleolar ankle fracture medial malleolous fixation   PERCUTANEOUS CORONARY STENT INTERVENTION (PCI-S) N/A 04/07/2012   Procedure: PERCUTANEOUS CORONARY STENT INTERVENTION (PCI-S);  Surgeon: Dorn JINNY Lesches, MD;  Location: [facility] CATH LAB;  Service: Cardiovascular;  Laterality: N/A;   PERCUTANEOUS CORONARY STENT INTERVENTION (PCI-S)  05/27/2014   Procedure: PERCUTANEOUS CORONARY STENT INTERVENTION (PCI-S);  Surgeon: Dorn JINNY Lesches, MD; 3 mm x 12 mm long Xience Xpedition DES to the proximal LAD   SAVORY DILATION N/A 06/13/2015   Procedure: SAVORY DILATION;  Surgeon: Dover Rollin, MD;  Location: [facility];  Service: Endoscopy;  Laterality: N/A;   SAVORY DILATION N/A 04/07/2018   Procedure: SAVORY DILATION;  Surgeon: Rollin Dover, MD;  Location: [facility];  Service: Endoscopy;  Laterality: N/A;   TEE WITHOUT CARDIOVERSION N/A 08/17/2017   Procedure: TRANSESOPHAGEAL ECHOCARDIOGRAM (TEE);  Surgeon:  Kerrin Elspeth BROCKS, MD;  Location: [facility] OR;  Service: Open Heart Surgery;  Laterality: N/A;   TEE WITHOUT CARDIOVERSION N/A 07/16/2021   Procedure: TRANSESOPHAGEAL ECHOCARDIOGRAM (TEE);  Surgeon: Santo Stanly LABOR, MD;  Location: [facility] ENDOSCOPY;  Service: Cardiovascular;  Laterality: N/A;   TONSILLECTOMY     TOTAL KNEE ARTHROPLASTY Left 2003   TOTAL KNEE REVISION Left 11/05/2013   Procedure: LEFT TOTAL KNEE RESECTION;  Surgeon: Donnice JONETTA Car, MD;  Location: [facility];  Service: Orthopedics;  Laterality: Left;   TOTAL KNEE REVISION Left 2007   opened in 2006 and cleaned out, 2007 revision   TOTAL KNEE REVISION Left 12/31/2013   Procedure: RE-INPLANTATION LEFT TOTAL KNEE ;  Surgeon: Donnice JONETTA Car, MD;  Location: [facility];  Service: Orthopedics;  Laterality: Left;   VIDEO ASSISTED THORACOSCOPY (VATS)/ LOBECTOMY  01/2009    Allergies  Allergen Reactions   Levofloxacin Itching   Morphine  And Codeine Itching and Other (See Comments)    Pt reports oxycodone  history with no allergy.    ROS    Physical Exam: There were no vitals filed  for this visit.  General: The patient is alert and oriented x3 in no acute distress.  Dermatology: Changes to skin of lower legs consistent with chronic venous stasis dermatitis and lymphedema.  Nailplates to have some thickened, yellow appearance but appear well-maintained overall.  Vascular: Difficult to palpate DP and PT pulses due to extent of edema.  Feet are warm and well-perfused.  Pedal hair growth absent.  Capillary refill 3 seconds to the digits.  +1 pitting edema present significant nonpitting edema bilaterally.  Neurological: Protective sensation decreased.  Musculoskeletal Exam: Muscle strength decreased bilaterally, 4/5 in dorsiflexion, plantarflexion, inversion, eversion.  There is decreased ankle joint range of motion, subtalar joint range of motion, lower extremities as a whole or stiff.  Today most significant area of tenderness is on bilateral  heels plantar medial aspect and plantar central aspect.  There is also tenderness on palpation of the plantar fat pad most proximally.  The  Radiographic Exam: Left and right foot radiographs 3 views 04/20/2024  No fractures or acute osseous irregularities noted.  Left foot ankle hardware in place from prior ORIF.  Decreased tibiotalar joint space seen.  Generalized osteopenia.  Spurring present posterior calcaneus with calcifications noted within the Achilles tendon left foot.  Right side similar findings of generalized osteopenia, there does appear to be some ankle arthritis and hindfoot arthritis as well.  Calcaneal spurring present posteriorly.  Vascular calcifications present.  Assessment/Plan of Care: 1. Plantar fasciitis, bilateral   2. Achilles tendinitis of both lower extremities      Meds ordered this encounter  Medications   triamcinolone  acetonide (KENALOG ) 10 MG/ML injection 10 mg   None  Discussed clinical findings with patient today.  Today did discuss findings of plantar fasciitis.  She does have limited mobility and cannot tolerate oral medication.  Did discuss home stretching regimen but patient will try to to the best of their ability due to limited mobility.  Discussed over-the-counter night splints as well.  The plantar heel pain seems more painful in the posterior calcaneal, Achilles insertional tendinitis pain today.  Verbal consent obtained to minister bilateral corticosteroid injection to the left and the right plantar fascia via plantar medial approach.  Alcohol skin prep.  Injected each heel with 0.5 cc each of 2% lidocaine  plain mixed with 0.5% Marcaine  plain and with 10 mg Kenalog .  Band-Aid applied.  Patient tolerated well.  Return to clinic in about 3 to 4 weeks.   Damaree Sargent L. Lamount MAUL, AACFAS [facility]     [address] [place], [place] [address]                Office [phone]  Fax [phone]

## 2024-06-08 NOTE — Patient Instructions (Addendum)

## 2024-06-14 DIAGNOSIS — K5909 Other constipation: Secondary | ICD-10-CM | POA: Diagnosis not present

## 2024-06-14 DIAGNOSIS — K3184 Gastroparesis: Secondary | ICD-10-CM | POA: Diagnosis not present

## 2024-06-14 DIAGNOSIS — K219 Gastro-esophageal reflux disease without esophagitis: Secondary | ICD-10-CM | POA: Diagnosis not present

## 2024-06-14 DIAGNOSIS — R140 Abdominal distension (gaseous): Secondary | ICD-10-CM | POA: Diagnosis not present

## 2024-06-19 ENCOUNTER — Telehealth: Payer: Self-pay | Admitting: Lab

## 2024-06-19 ENCOUNTER — Telehealth: Payer: Self-pay | Admitting: Podiatry

## 2024-06-19 NOTE — Telephone Encounter (Signed)
 Patient is requesting pain management for the right foot. She reports a significant amount of ongoing pain, making it difficult to wear a shoe for extended periods. She has not experienced relief since the last cortisone injection, and extra strength Tylenol  is not helping.  If a pain medication is prescribed, please send the prescription to the [facility] pharmacy on file. Thank you

## 2024-06-19 NOTE — Telephone Encounter (Signed)
Spoke to patient informed

## 2024-06-19 NOTE — Telephone Encounter (Signed)
 Patient states is in need for something for pain injection was not effective please advise.

## 2024-06-20 ENCOUNTER — Telehealth: Payer: Self-pay | Admitting: Podiatry

## 2024-06-20 ENCOUNTER — Ambulatory Visit: Admitting: Podiatry

## 2024-06-20 NOTE — Telephone Encounter (Signed)
 Patient called requesting additional recommendations for pain relief. She reports continued pressure and significant pain, especially when placing her foot on the floor or attempting to put on a shoe. States she has tried elevating the foot on a pillow, but pain persists. Seeking guidance on any other measures she can take to alleviate the pain.

## 2024-06-21 ENCOUNTER — Ambulatory Visit

## 2024-06-21 NOTE — Telephone Encounter (Signed)
 Spoke to patient and advised of recommendations.

## 2024-06-22 ENCOUNTER — Encounter

## 2024-06-25 ENCOUNTER — Ambulatory Visit: Admitting: Orthopedic Surgery

## 2024-06-26 ENCOUNTER — Ambulatory Visit

## 2024-06-26 ENCOUNTER — Encounter (HOSPITAL_BASED_OUTPATIENT_CLINIC_OR_DEPARTMENT_OTHER): Attending: Internal Medicine | Admitting: Internal Medicine

## 2024-06-26 DIAGNOSIS — I890 Lymphedema, not elsewhere classified: Secondary | ICD-10-CM | POA: Diagnosis not present

## 2024-06-26 DIAGNOSIS — L97812 Non-pressure chronic ulcer of other part of right lower leg with fat layer exposed: Secondary | ICD-10-CM | POA: Diagnosis not present

## 2024-06-26 DIAGNOSIS — I87311 Chronic venous hypertension (idiopathic) with ulcer of right lower extremity: Secondary | ICD-10-CM | POA: Diagnosis not present

## 2024-06-29 ENCOUNTER — Ambulatory Visit: Admitting: Podiatry

## 2024-07-01 ENCOUNTER — Inpatient Hospital Stay (HOSPITAL_COMMUNITY)
Admission: EM | Admit: 2024-07-01 | Discharge: 2024-07-06 | DRG: 602 | Disposition: A | Discharge status: Skilled Nursing Facility | Attending: Internal Medicine | Admitting: Internal Medicine

## 2024-07-01 ENCOUNTER — Other Ambulatory Visit: Payer: Self-pay

## 2024-07-01 DIAGNOSIS — L03119 Cellulitis of unspecified part of limb: Secondary | ICD-10-CM | POA: Diagnosis not present

## 2024-07-01 DIAGNOSIS — Z951 Presence of aortocoronary bypass graft: Secondary | ICD-10-CM

## 2024-07-01 DIAGNOSIS — I130 Hypertensive heart and chronic kidney disease with heart failure and stage 1 through stage 4 chronic kidney disease, or unspecified chronic kidney disease: Secondary | ICD-10-CM | POA: Diagnosis present

## 2024-07-01 DIAGNOSIS — I5032 Chronic diastolic (congestive) heart failure: Secondary | ICD-10-CM | POA: Diagnosis present

## 2024-07-01 DIAGNOSIS — I2510 Atherosclerotic heart disease of native coronary artery without angina pectoris: Secondary | ICD-10-CM | POA: Diagnosis present

## 2024-07-01 DIAGNOSIS — T502X5A Adverse effect of carbonic-anhydrase inhibitors, benzothiadiazides and other diuretics, initial encounter: Secondary | ICD-10-CM | POA: Diagnosis present

## 2024-07-01 DIAGNOSIS — Z85118 Personal history of other malignant neoplasm of bronchus and lung: Secondary | ICD-10-CM

## 2024-07-01 DIAGNOSIS — I10 Essential (primary) hypertension: Secondary | ICD-10-CM | POA: Diagnosis present

## 2024-07-01 DIAGNOSIS — L0390 Cellulitis, unspecified: Secondary | ICD-10-CM | POA: Diagnosis not present

## 2024-07-01 DIAGNOSIS — R791 Abnormal coagulation profile: Secondary | ICD-10-CM | POA: Diagnosis not present

## 2024-07-01 DIAGNOSIS — Z96652 Presence of left artificial knee joint: Secondary | ICD-10-CM | POA: Diagnosis present

## 2024-07-01 DIAGNOSIS — R600 Localized edema: Secondary | ICD-10-CM | POA: Diagnosis not present

## 2024-07-01 DIAGNOSIS — Z794 Long term (current) use of insulin: Secondary | ICD-10-CM

## 2024-07-01 DIAGNOSIS — Z7901 Long term (current) use of anticoagulants: Secondary | ICD-10-CM

## 2024-07-01 DIAGNOSIS — N183 Chronic kidney disease, stage 3 unspecified: Secondary | ICD-10-CM | POA: Diagnosis present

## 2024-07-01 DIAGNOSIS — Z90710 Acquired absence of both cervix and uterus: Secondary | ICD-10-CM

## 2024-07-01 DIAGNOSIS — M79606 Pain in leg, unspecified: Secondary | ICD-10-CM | POA: Diagnosis not present

## 2024-07-01 DIAGNOSIS — R1030 Lower abdominal pain, unspecified: Secondary | ICD-10-CM | POA: Diagnosis not present

## 2024-07-01 DIAGNOSIS — L03115 Cellulitis of right lower limb: Principal | ICD-10-CM | POA: Diagnosis present

## 2024-07-01 DIAGNOSIS — N1832 Chronic kidney disease, stage 3b: Secondary | ICD-10-CM | POA: Diagnosis not present

## 2024-07-01 DIAGNOSIS — I252 Old myocardial infarction: Secondary | ICD-10-CM

## 2024-07-01 DIAGNOSIS — E66813 Obesity, class 3: Secondary | ICD-10-CM | POA: Diagnosis present

## 2024-07-01 DIAGNOSIS — Z6841 Body Mass Index (BMI) 40.0 and over, adult: Secondary | ICD-10-CM

## 2024-07-01 DIAGNOSIS — I480 Paroxysmal atrial fibrillation: Secondary | ICD-10-CM | POA: Diagnosis present

## 2024-07-01 DIAGNOSIS — Z79899 Other long term (current) drug therapy: Secondary | ICD-10-CM

## 2024-07-01 DIAGNOSIS — N179 Acute kidney failure, unspecified: Secondary | ICD-10-CM | POA: Diagnosis present

## 2024-07-01 DIAGNOSIS — Z961 Presence of intraocular lens: Secondary | ICD-10-CM | POA: Diagnosis present

## 2024-07-01 DIAGNOSIS — Z87891 Personal history of nicotine dependence: Secondary | ICD-10-CM

## 2024-07-01 DIAGNOSIS — D631 Anemia in chronic kidney disease: Secondary | ICD-10-CM | POA: Diagnosis present

## 2024-07-01 DIAGNOSIS — D649 Anemia, unspecified: Secondary | ICD-10-CM | POA: Diagnosis present

## 2024-07-01 DIAGNOSIS — E1122 Type 2 diabetes mellitus with diabetic chronic kidney disease: Secondary | ICD-10-CM | POA: Diagnosis present

## 2024-07-01 DIAGNOSIS — E1169 Type 2 diabetes mellitus with other specified complication: Secondary | ICD-10-CM | POA: Diagnosis present

## 2024-07-01 DIAGNOSIS — E1151 Type 2 diabetes mellitus with diabetic peripheral angiopathy without gangrene: Secondary | ICD-10-CM | POA: Diagnosis present

## 2024-07-01 DIAGNOSIS — Z955 Presence of coronary angioplasty implant and graft: Secondary | ICD-10-CM

## 2024-07-01 DIAGNOSIS — K219 Gastro-esophageal reflux disease without esophagitis: Secondary | ICD-10-CM | POA: Diagnosis present

## 2024-07-01 DIAGNOSIS — Z9841 Cataract extraction status, right eye: Secondary | ICD-10-CM

## 2024-07-01 DIAGNOSIS — L03116 Cellulitis of left lower limb: Secondary | ICD-10-CM | POA: Diagnosis present

## 2024-07-01 DIAGNOSIS — E7800 Pure hypercholesterolemia, unspecified: Secondary | ICD-10-CM | POA: Diagnosis present

## 2024-07-01 DIAGNOSIS — Z9842 Cataract extraction status, left eye: Secondary | ICD-10-CM

## 2024-07-01 DIAGNOSIS — Z8249 Family history of ischemic heart disease and other diseases of the circulatory system: Secondary | ICD-10-CM

## 2024-07-01 DIAGNOSIS — I5033 Acute on chronic diastolic (congestive) heart failure: Secondary | ICD-10-CM | POA: Diagnosis present

## 2024-07-01 DIAGNOSIS — E7849 Other hyperlipidemia: Secondary | ICD-10-CM | POA: Diagnosis present

## 2024-07-01 NOTE — ED Triage Notes (Signed)
 Pt bib EMS from home c/o bilat leg pain, reports she's currently seeing wound care for her right leg. Family reports they are concerned that pt may have UTI as she has had foul smelling urine for a few days Pt denies any s/s.

## 2024-07-02 ENCOUNTER — Encounter (HOSPITAL_COMMUNITY): Payer: Self-pay | Admitting: Internal Medicine

## 2024-07-02 ENCOUNTER — Emergency Department (HOSPITAL_COMMUNITY)

## 2024-07-02 DIAGNOSIS — L03119 Cellulitis of unspecified part of limb: Secondary | ICD-10-CM | POA: Diagnosis not present

## 2024-07-02 DIAGNOSIS — I517 Cardiomegaly: Secondary | ICD-10-CM | POA: Diagnosis not present

## 2024-07-02 DIAGNOSIS — M1712 Unilateral primary osteoarthritis, left knee: Secondary | ICD-10-CM | POA: Diagnosis not present

## 2024-07-02 DIAGNOSIS — S81801A Unspecified open wound, right lower leg, initial encounter: Secondary | ICD-10-CM | POA: Diagnosis not present

## 2024-07-02 DIAGNOSIS — R791 Abnormal coagulation profile: Secondary | ICD-10-CM | POA: Diagnosis present

## 2024-07-02 DIAGNOSIS — R600 Localized edema: Secondary | ICD-10-CM | POA: Diagnosis not present

## 2024-07-02 DIAGNOSIS — L0390 Cellulitis, unspecified: Secondary | ICD-10-CM | POA: Diagnosis present

## 2024-07-02 LAB — COMPREHENSIVE METABOLIC PANEL WITH GFR
ALT: 20 U/L (ref 0–44)
AST: 29 U/L (ref 15–41)
Albumin: 3.4 g/dL — ABNORMAL LOW (ref 3.5–5.0)
Alkaline Phosphatase: 160 U/L — ABNORMAL HIGH (ref 38–126)
Anion gap: 10 (ref 5–15)
BUN: 15 mg/dL (ref 8–23)
CO2: 24 mmol/L (ref 22–32)
Calcium: 9.7 mg/dL (ref 8.9–10.3)
Chloride: 103 mmol/L (ref 98–111)
Creatinine, Ser: 1.3 mg/dL — ABNORMAL HIGH (ref 0.44–1.00)
GFR, Estimated: 43 mL/min — ABNORMAL LOW (ref >60)
Glucose, Bld: 87 mg/dL (ref 70–99)
Potassium: 4 mmol/L (ref 3.5–5.1)
Sodium: 137 mmol/L (ref 135–145)
Total Bilirubin: 1.1 mg/dL (ref 0.0–1.2)
Total Protein: 7.2 g/dL (ref 6.5–8.1)

## 2024-07-02 LAB — PROTIME-INR
INR: 5.9 (ref 0.8–1.2)
Prothrombin Time: 55.1 s — ABNORMAL HIGH (ref 11.4–15.2)

## 2024-07-02 LAB — URINALYSIS, ROUTINE W REFLEX MICROSCOPIC
Bacteria, UA: NONE SEEN
Bilirubin Urine: NEGATIVE
Glucose, UA: NEGATIVE mg/dL
Ketones, ur: NEGATIVE mg/dL
Leukocytes,Ua: NEGATIVE
Nitrite: NEGATIVE
Protein, ur: 100 mg/dL — AB
Specific Gravity, Urine: 1.012 (ref 1.005–1.030)
pH: 5 (ref 5.0–8.0)

## 2024-07-02 LAB — HEMOGLOBIN A1C
Hgb A1c MFr Bld: 8.5 % — ABNORMAL HIGH (ref 4.8–5.6)
Mean Plasma Glucose: 197.25 mg/dL

## 2024-07-02 LAB — CBC WITH DIFFERENTIAL/PLATELET
Abs Immature Granulocytes: 0.04 K/uL (ref 0.00–0.07)
Basophils Absolute: 0 K/uL (ref 0.0–0.1)
Basophils Relative: 1 %
Eosinophils Absolute: 0.1 K/uL (ref 0.0–0.5)
Eosinophils Relative: 2 %
HCT: 36.8 % (ref 36.0–46.0)
Hemoglobin: 10.9 g/dL — ABNORMAL LOW (ref 12.0–15.0)
Immature Granulocytes: 1 %
Lymphocytes Relative: 15 %
Lymphs Abs: 1.2 K/uL (ref 0.7–4.0)
MCH: 25.8 pg — ABNORMAL LOW (ref 26.0–34.0)
MCHC: 29.6 g/dL — ABNORMAL LOW (ref 30.0–36.0)
MCV: 87.2 fL (ref 80.0–100.0)
Monocytes Absolute: 0.7 K/uL (ref 0.1–1.0)
Monocytes Relative: 9 %
Neutro Abs: 5.8 K/uL (ref 1.7–7.7)
Neutrophils Relative %: 72 %
Platelets: 217 K/uL (ref 150–400)
RBC: 4.22 MIL/uL (ref 3.87–5.11)
RDW: 16 % — ABNORMAL HIGH (ref 11.5–15.5)
WBC: 7.9 K/uL (ref 4.0–10.5)
nRBC: 0 % (ref 0.0–0.2)

## 2024-07-02 LAB — PRO BRAIN NATRIURETIC PEPTIDE: Pro Brain Natriuretic Peptide: 2537 pg/mL — ABNORMAL HIGH (ref <300.0)

## 2024-07-02 LAB — GLUCOSE, CAPILLARY
Glucose-Capillary: 87 mg/dL (ref 70–99)
Glucose-Capillary: 149 mg/dL — ABNORMAL HIGH (ref 70–99)
Glucose-Capillary: 135 mg/dL — ABNORMAL HIGH (ref 70–99)
Glucose-Capillary: 185 mg/dL — ABNORMAL HIGH (ref 70–99)

## 2024-07-02 LAB — LACTIC ACID, PLASMA: Lactic Acid, Venous: 1.3 mmol/L (ref 0.5–1.9)

## 2024-07-02 MED ORDER — FUROSEMIDE 10 MG/ML IJ SOLN
40.0000 mg | Freq: Once | INTRAMUSCULAR | Status: AC
  Administered 2024-07-02: 40 mg via INTRAVENOUS
  Filled 2024-07-02: qty 4

## 2024-07-02 MED ORDER — WARFARIN - PHARMACIST DOSING INPATIENT: Freq: Every day | Status: DC

## 2024-07-02 MED ORDER — VANCOMYCIN HCL IN DEXTROSE 1-5 GM/200ML-% IV SOLN: 1000.0000 mg | Freq: Once | INTRAVENOUS | Status: DC

## 2024-07-02 MED ORDER — VANCOMYCIN HCL 2000 MG/400ML IV SOLN
2000.0000 mg | Freq: Once | INTRAVENOUS | Status: AC
Start: 2024-07-02 — End: 2024-07-02
  Administered 2024-07-02: 2000 mg via INTRAVENOUS
  Filled 2024-07-02: qty 400

## 2024-07-02 MED ORDER — ROSUVASTATIN CALCIUM 20 MG PO TABS
40.0000 mg | ORAL_TABLET | Freq: Every day | ORAL | Status: DC
  Administered 2024-07-02 – 2024-07-06 (×5): 40 mg via ORAL
  Filled 2024-07-02 (×5): qty 2

## 2024-07-02 MED ORDER — SODIUM CHLORIDE 0.9 % IV SOLN
2.0000 g | Freq: Once | INTRAVENOUS | Status: AC
  Administered 2024-07-02: 2 g via INTRAVENOUS
  Filled 2024-07-02: qty 12.5

## 2024-07-02 MED ORDER — METOPROLOL SUCCINATE ER 50 MG PO TB24
50.0000 mg | ORAL_TABLET | Freq: Every day | ORAL | Status: DC
  Administered 2024-07-02 – 2024-07-06 (×5): 50 mg via ORAL
  Filled 2024-07-02 (×5): qty 1

## 2024-07-02 MED ORDER — INSULIN ASPART 100 UNIT/ML IJ SOLN
0.0000 [IU] | Freq: Three times a day (TID) | INTRAMUSCULAR | Status: DC
  Administered 2024-07-02 – 2024-07-05 (×6): 1 [IU] via SUBCUTANEOUS
  Administered 2024-07-06: 2 [IU] via SUBCUTANEOUS

## 2024-07-02 MED ORDER — ACETAMINOPHEN 325 MG PO TABS
650.0000 mg | ORAL_TABLET | Freq: Four times a day (QID) | ORAL | Status: DC | PRN
  Administered 2024-07-03: 650 mg via ORAL
  Filled 2024-07-02: qty 2

## 2024-07-02 MED ORDER — LISINOPRIL 10 MG PO TABS
10.0000 mg | ORAL_TABLET | Freq: Every day | ORAL | Status: DC
  Administered 2024-07-03: 10 mg via ORAL
  Filled 2024-07-02: qty 1

## 2024-07-02 MED ORDER — INSULIN ASPART PROT & ASPART (70-30 MIX) 100 UNIT/ML ~~LOC~~ SUSP
40.0000 [IU] | Freq: Every day | SUBCUTANEOUS | Status: DC
  Administered 2024-07-03 – 2024-07-05 (×3): 40 [IU] via SUBCUTANEOUS
  Filled 2024-07-02: qty 10

## 2024-07-02 MED ORDER — SPIRONOLACTONE 25 MG PO TABS
25.0000 mg | ORAL_TABLET | Freq: Every day | ORAL | Status: DC
  Administered 2024-07-02 – 2024-07-04 (×3): 25 mg via ORAL
  Filled 2024-07-02 (×3): qty 1

## 2024-07-02 MED ORDER — ONDANSETRON HCL 4 MG PO TABS
4.0000 mg | ORAL_TABLET | Freq: Four times a day (QID) | ORAL | Status: DC | PRN
  Administered 2024-07-02: 4 mg via ORAL
  Filled 2024-07-02: qty 1

## 2024-07-02 MED ORDER — ONDANSETRON HCL 4 MG/2ML IJ SOLN: 4.0000 mg | Freq: Four times a day (QID) | INTRAMUSCULAR | Status: DC | PRN

## 2024-07-02 MED ORDER — OXYCODONE HCL 5 MG PO TABS
5.0000 mg | ORAL_TABLET | ORAL | Status: DC | PRN
Start: 2024-07-02 — End: 2024-07-06
  Administered 2024-07-03 – 2024-07-05 (×5): 5 mg via ORAL
  Filled 2024-07-02 (×6): qty 1

## 2024-07-02 MED ORDER — PANTOPRAZOLE SODIUM 40 MG PO TBEC
40.0000 mg | DELAYED_RELEASE_TABLET | Freq: Every day | ORAL | Status: DC
  Administered 2024-07-02 – 2024-07-06 (×5): 40 mg via ORAL
  Filled 2024-07-02 (×5): qty 1

## 2024-07-02 MED ORDER — FENTANYL CITRATE PF 50 MCG/ML IJ SOSY
25.0000 ug | PREFILLED_SYRINGE | Freq: Once | INTRAMUSCULAR | Status: AC
  Administered 2024-07-02: 25 ug via INTRAVENOUS
  Filled 2024-07-02: qty 1

## 2024-07-02 MED ORDER — CEFEPIME HCL 2 G IV SOLR
2.0000 g | Freq: Two times a day (BID) | INTRAVENOUS | Status: DC
  Administered 2024-07-02 – 2024-07-04 (×4): 2 g via INTRAVENOUS
  Filled 2024-07-02 (×4): qty 12.5

## 2024-07-02 MED ORDER — VANCOMYCIN HCL 1750 MG/350ML IV SOLN
1750.0000 mg | INTRAVENOUS | Status: DC
  Administered 2024-07-04: 1750 mg via INTRAVENOUS
  Filled 2024-07-02: qty 350

## 2024-07-02 MED ORDER — ACETAMINOPHEN 650 MG RE SUPP: 650.0000 mg | Freq: Four times a day (QID) | RECTAL | Status: DC | PRN

## 2024-07-02 MED ORDER — FUROSEMIDE 40 MG PO TABS
40.0000 mg | ORAL_TABLET | Freq: Every day | ORAL | Status: DC
  Administered 2024-07-03: 40 mg via ORAL
  Filled 2024-07-02: qty 1

## 2024-07-02 NOTE — Progress Notes (Signed)
 Pharmacy Antibiotic Note  [name] is a 73 y.o. female admitted on 07/01/2024 with UTI and RLE cellulitis.  Pharmacy has been consulted for vancomycin  and cefepime dosing.   Received cefepime 2 g IV at 03:45 and vancomycin  2 g IV at 04:18. She is afebrile, WBC are normal, and SCr 1.3 (baseline ~1.1-1.3). Blood cultures are pending.   Plan: Vancomycin  1750 mg IV q48h Goal AUC 400-550, eAUC 472.4, SCr used 1.3, Vd 0.5 Cefepime 2 g IV q12h Monitor renal function, clinical progress, cultures/sensitivities F/U LOT and de-escalate as able Vancomycin  levels as clinically indicated     Temp (24hrs), Avg:98.3 F (36.8 C), Min:98.2 F (36.8 C), Max:98.4 F (36.9 C)  Recent Labs  Lab 07/02/24 0028 07/02/24 0039 07/02/24 0311  WBC  --  7.9  --   CREATININE  --   --  1.30*  LATICACIDVEN 1.3  --   --     CrCl cannot be calculated (Unknown ideal weight.).    Allergies  Allergen Reactions   Levofloxacin Itching   Morphine  And Codeine Itching and Other (See Comments)    Pt reports oxycodone  history with no allergy.    Antimicrobials this admission: Vanc 10/6 >> Cefepime 10/6 >>  Dose adjustments this admission:   Microbiology results: 10/6 BCx: pending    Thank you for involving pharmacy in this patient's care.  Delon Sax, PharmD, BCPS Clinical Pharmacist Clinical phone for 07/02/2024 is [phone] 07/02/2024 9:22 AM

## 2024-07-02 NOTE — Care Management Obs Status (Signed)
 MEDICARE OBSERVATION STATUS NOTIFICATION   Patient Details  Name: [name] MRN: [id number] Date of Birth: 12/10/1950   Medicare Observation Status Notification Given:  Yes  Obs was verbally given to the patient and a copy will be left in the patient room  Jamera Vanloan 07/02/2024, 9:40 AM

## 2024-07-02 NOTE — Progress Notes (Signed)
 PHARMACY - ANTICOAGULATION CONSULT NOTE  Pharmacy Consult for warfarin Indication: atrial fibrillation  Allergies  Allergen Reactions   Levofloxacin Itching   Morphine  And Codeine Itching and Other (See Comments)    Pt reports oxycodone  history with no allergy.    Patient Measurements:    Vital Signs: Temp: 98.2 F (36.8 C) (10/06 0831) Temp Source: Oral (10/06 0831) BP: 135/61 (10/06 0831) Pulse Rate: 92 (10/06 0831)  Labs: Recent Labs    07/02/24 0039 07/02/24 0311  HGB 10.9*  --   HCT 36.8  --   PLT 217  --   LABPROT  --  55.1*  INR  --  5.9*  CREATININE  --  1.30*    CrCl cannot be calculated (Unknown ideal weight.).   Medical History: Past Medical History:  Diagnosis Date   Arthritis    knees (02/05/2016)   Cancer (HCC) 5/10   LUL Vats    CHF (congestive heart failure) (HCC)    Coronary artery disease    GERD (gastroesophageal reflux disease)    tx meds   H/O hiatal hernia    History of blood transfusion several   last april 2015 s/p knee surgery (02/05/2016)   Hypercholesteremia    Hypertension    Lung cancer (HCC)    2010, surgery 18% left lung no radiation or chemo   Non-STEMI (non-ST elevated myocardial infarction) (HCC) 04/07/2012   See cath results below   Paroxysmal atrial fibrillation (HCC) 04/07/2012   On Warfarin   Peripheral vascular disease 8/12   Lt SFA PTA   Presence of stent in LAD coronary artery 04/07/2012   Xience Expedition DES 2.75 mm x 18 mm (dilated to 3.0 mm)   Sleep apnea    does not wear CPAP   Type II diabetes mellitus (HCC)    insulin  dependent    Medications:  Medications Prior to Admission  Medication Sig Dispense Refill Last Dose/Taking   acetaminophen  (TYLENOL ) 325 MG tablet Take 2 tablets (650 mg total) by mouth every 6 (six) hours as needed for mild pain (or Fever >/= 101). 20 tablet 0 Unknown   Cholecalciferol  (VITAMIN D3) 50 MCG (2000 UT) TABS Take 2,000 Units by mouth daily.   07/01/2024 Morning    furosemide  (LASIX ) 40 MG tablet Take 40 mg daily until weight is 244 pounds. Once weight reaches 244, take 40 mg every other day. 90 tablet 3 Unknown   insulin  NPH-regular Human (70-30) 100 UNIT/ML injection Inject 20 Units into the skin 2 (two) times daily with a meal. (Patient taking differently: Inject 20-50 Units into the skin 2 (two) times daily with a meal. 50 Units in the AM and PRN in the PM) 12 mL 0 07/01/2024 Morning   Lancets (ONETOUCH DELICA PLUS LANCET33G) MISC Apply topically.   Unknown   lisinopril  (ZESTRIL ) 10 MG tablet Take 1 tablet (10 mg total) by mouth daily. 30 tablet 0 07/01/2024 Morning   metoCLOPramide  (REGLAN ) 10 MG tablet Take 10 mg by mouth 4 (four) times daily.   07/01/2024 Morning   metoprolol  succinate (TOPROL -XL) 50 MG 24 hr tablet Take 1 tablet (50 mg total) by mouth daily. Take with or immediately following a meal. 90 tablet 3 07/01/2024 Morning   metoprolol  tartrate (LOPRESSOR ) 25 MG tablet Take 1 table as needed for heart rate above 120bpm. 90 tablet 3 Unknown   ONETOUCH VERIO test strip 1 each 2 (two) times daily.   Unknown   pantoprazole  (PROTONIX ) 40 MG tablet Take 40 mg by mouth  daily.   07/01/2024 Morning   rosuvastatin  (CRESTOR ) 40 MG tablet Take 40 mg by mouth daily.   07/01/2024 Morning   spironolactone  (ALDACTONE ) 25 MG tablet Take 1 tablet (25 mg total) by mouth daily. Take 25 mg daily until your weight is 244 pounds. Once your weight reaches 244 pounds, then take 25 mg every other day. 90 tablet 3 Unknown   warfarin (COUMADIN ) 7.5 MG tablet TAKE 1/2 TABLET TO 1 TABLET BY MOUTH DAILY AS DIRECTED BY COUMADIN  CLINIC (Patient taking differently: Take 3.75-7.5 mg by mouth as directed. 3.75 MG : Monday And Wednesday 7.5 MG : Tues, Thurs, Fri, Sat, Sun) 65 tablet 1 07/01/2024 at  7:30 PM   Blood Glucose Monitoring Suppl (ONETOUCH VERIO FLEX SYSTEM) w/Device KIT    Unknown   diclofenac  Sodium (VOLTAREN ) 1 % GEL Apply 2 g topically 4 (four) times daily. Right knee (Patient  not taking: Reported on 07/02/2024) 50 g 0 Not Taking   sucralfate  (CARAFATE ) 1 g tablet Take 1 tablet (1 g total) by mouth 4 (four) times daily -  with meals and at bedtime. (Patient not taking: Reported on 07/02/2024) 21 tablet 0 Not Taking    Assessment: 16 yof admitted with RLE cellulitis and possible UTI started on antibiotics. She takes warfarin PTA for Afib. Pharmacy consulted to manage warfarin inpatient.   INR is supratherapeutic on admit at 5.9. No bleeding noted, Hgb 10.9 and this appears to be baseline, platelets are normal. No drug interactions noted.  PTA regimen: 7.5 mg daily except 3.75 mg MW, last dose 10/5  Goal of Therapy:  INR 2-3 Monitor platelets by anticoagulation protocol: Yes   Plan:  Hold warfarin tonight INR daily Monitor for s/sx of bleeding  Thank you for involving pharmacy in this patient's care.  Delon Sax, PharmD, BCPS Clinical Pharmacist Clinical phone for 07/02/2024 is [phone] 07/02/2024 9:23 AM

## 2024-07-02 NOTE — H&P (Signed)
 History and Physical    Patient: [name] FMW:[id number] DOB: 29-May-1951 DOA: 07/01/2024 DOS: the patient was seen and examined on 07/02/2024 PCP: Verdia Lombard, MD  Patient coming from: Home  Chief Complaint:  Chief Complaint  Patient presents with   Leg Pain   HPI: [name] is a 73 y.o. female with medical history significant of osteoarthritis, GERD/hiatal hernia, type 2 diabetes, hyperlipidemia, hypertension, CAD, history of LAD stent, paroxysmal atrial fibrillation on warfarin, chronic diastolic heart failure, peripheral vascular disease, sleep apnea not on CPAP, history of lung cancer and LUL VATS, chronic lower extremity wounds following at the wound center who presented to the emergency department with complaints of bilateral lower extremity pain.  She has been having drainage, but not bleeding from the ulcers.  She has also had some urinary frequency, thought she had an UTI but urinalysis was not consistent with infection.  She denied fever, chills, rhinorrhea, sore throat, wheezing or hemoptysis.  No chest pain, palpitations, diaphoresis, PND, orthopnea or pitting edema of the lower extremities.  No abdominal pain, nausea, emesis, diarrhea, constipation, melena or hematochezia.  No flank pain, dysuria, frequency or hematuria.  No polyuria, polydipsia, polyphagia or blurred vision.   Lab work: Urinalysis showed moderate hemoglobin and protein of 100 mg/dL.  CBC showed a white count 7.9, hemoglobin 10.9 g/dL and platelets 782.  PT 55.1 seconds and INR 5.9..  Lactic acid was normal.  proBNP 2537.0 pg/mL.  CMP showed normal electrolytes and glucose.  Total bilirubin 1.1, BUN was 15 and creatinine 1.30 mg/dL with a GFR of 43 mL/min.  LFTs show an albumin  of 3.4 g/dL and alkaline phosphatase 160 units/L, the rest of the CMP measurements were normal.  Imaging: Portable 1 view chest radiograph showing a stable cardiomegaly and central vascular prominence without overt edema.   No other acute chest disease.  Right tibia x-ray showing diffuse stranding edema throughout the right foreleg ankle and foot.  No soft tissue gas or visible foreign body.  No evidence of PVL/fibular fractures or other focal bone lesions.  Vascular calcifications.  Advance 3 compartmental arthrosis of the left knee.   ED course: Initial vital signs were temperature 98.4 F, pulse 80, respiration 20, BP 133/56 mmHg and O2 sat 100% on room air.  The patient received cefepime 2 g IVPB, furosemide  40 mg IVP and vancomycin  2000 mg IVPB.  Review of Systems: As mentioned in the history of present illness. All other systems reviewed and are negative. Past Medical History:  Diagnosis Date   Arthritis    knees (02/05/2016)   Cancer (HCC) 5/10   LUL Vats    CHF (congestive heart failure) (HCC)    Coronary artery disease    GERD (gastroesophageal reflux disease)    tx meds   H/O hiatal hernia    History of blood transfusion several   last april 2015 s/p knee surgery (02/05/2016)   Hypercholesteremia    Hypertension    Lung cancer (HCC)    2010, surgery 18% left lung no radiation or chemo   Non-STEMI (non-ST elevated myocardial infarction) (HCC) 04/07/2012   See cath results below   Paroxysmal atrial fibrillation (HCC) 04/07/2012   On Warfarin   Peripheral vascular disease 8/12   Lt SFA PTA   Presence of stent in LAD coronary artery 04/07/2012   Xience Expedition DES 2.75 mm x 18 mm (dilated to 3.0 mm)   Sleep apnea    does not wear CPAP   Type II diabetes  mellitus (HCC)    insulin  dependent   Past Surgical History:  Procedure Laterality Date   ABDOMINAL HYSTERECTOMY  1990   BREAST EXCISIONAL BIOPSY Right    CARDIAC CATHETERIZATION  11/04/2008   patent RCA, LM, and Circ, nl EF   CARDIAC CATHETERIZATION  02/20/2002   patent coronaries with the only abnormality being a smooth luminla irregularity in the mid intermediate ramus branch no felt to be hemodynamically significant, nl LV   CARDIAC  CATHETERIZATION N/A 02/05/2016   Procedure: Left Heart Cath and Coronary Angiography;  Surgeon: Alm LELON Clay, MD;  Location: [facility] INVASIVE CV LAB;  Service: Cardiovascular;  Laterality: N/A;   CARDIAC CATHETERIZATION N/A 02/05/2016   Procedure: Coronary Stent Intervention;  Surgeon: Alm LELON Clay, MD;  Location: [facility] INVASIVE CV LAB;  Service: Cardiovascular;  Laterality: N/A;   CARDIOVERSION N/A 07/16/2021   Procedure: CARDIOVERSION;  Surgeon: Santo Stanly LABOR, MD;  Location: [facility] ENDOSCOPY;  Service: Cardiovascular;  Laterality: N/A;   CATARACT EXTRACTION W/ INTRAOCULAR LENS  IMPLANT, BILATERAL  2013   CORONARY ANGIOPLASTY WITH STENT PLACEMENT  04/07/2012   Xience Expedition DES 2.16mm x 18 mm (dilated to 3.0 mm) to the prox LAD    CORONARY ANGIOPLASTY WITH STENT PLACEMENT  2013; 2015   CORONARY ARTERY BYPASS GRAFT N/A 08/17/2017   Procedure: CORONARY ARTERY BYPASS GRAFTING (CABG) TIMES 1 USING LEFT INTERNAL MAMMARY ARTERY;  Surgeon: Kerrin Elspeth BROCKS, MD;  Location: [facility] OR;  Service: Open Heart Surgery;  Laterality: N/A;   DILATION AND CURETTAGE OF UTERUS  <1990   ESOPHAGOGASTRODUODENOSCOPY N/A 05/23/2015   Procedure: ESOPHAGOGASTRODUODENOSCOPY (EGD);  Surgeon: Belvie Just, MD;  Location: [facility];  Service: Endoscopy;  Laterality: N/A;   ESOPHAGOGASTRODUODENOSCOPY (EGD) WITH PROPOFOL  N/A 06/13/2015   Procedure: ESOPHAGOGASTRODUODENOSCOPY (EGD) WITH PROPOFOL ;  Surgeon: Belvie Just, MD;  Location: [facility] ENDOSCOPY;  Service: Endoscopy;  Laterality: N/A;   ESOPHAGOGASTRODUODENOSCOPY (EGD) WITH PROPOFOL  N/A 04/07/2018   Procedure: ESOPHAGOGASTRODUODENOSCOPY (EGD) WITH PROPOFOL ;  Surgeon: Just Belvie, MD;  Location: [facility] ENDOSCOPY;  Service: Endoscopy;  Laterality: N/A;  fluoroscopy is needed   FRACTURE SURGERY     HAMMER TOE SURGERY Bilateral 1993   with screws, on screw removed   JOINT REPLACEMENT     KNEE ARTHROSCOPY Bilateral    left x2, right x1   LAPAROSCOPIC CHOLECYSTECTOMY      LEFT HEART CATH AND CORONARY ANGIOGRAPHY N/A 08/11/2017   Procedure: LEFT HEART CATH AND CORONARY ANGIOGRAPHY;  Surgeon: Swazil[place], Peter M, MD;  Location: [facility] INVASIVE CV LAB;  Service: Cardiovascular;  Laterality: N/A;   LEFT HEART CATHETERIZATION WITH CORONARY ANGIOGRAM N/A 04/07/2012   Procedure: LEFT HEART CATHETERIZATION WITH CORONARY ANGIOGRAM;  Surgeon: Dorn JINNY Lesches, MD;  Location: [facility] CATH LAB;  Service: Cardiovascular;  Laterality: N/A;   LEFT HEART CATHETERIZATION WITH CORONARY ANGIOGRAM N/A 05/27/2014   Procedure: LEFT HEART CATHETERIZATION WITH CORONARY ANGIOGRAM;  Surgeon: Dorn JINNY Lesches, MD; LAD 99% ISR, CFX 50-60%, RCA (dominant) no sig dz, EF 60%   LOWER EXTREMITY ANGIOGRAM  05/17/11   directional atherectomy to the prox L SFA using a LX Man TurboHawk, ballooned with a [place] balloon    ORIF ANKLE FRACTURE  07/09/2012   Procedure: OPEN REDUCTION INTERNAL FIXATION (ORIF) ANKLE FRACTURE;  Surgeon: Cordella Glendia Hutchinson, MD;  Location: [facility] ORS;  Service: Orthopedics;  Laterality: Left;  open reduction internal fixation trimalleolar ankle fracture medial malleolous fixation   PERCUTANEOUS CORONARY STENT INTERVENTION (PCI-S) N/A 04/07/2012   Procedure: PERCUTANEOUS CORONARY STENT INTERVENTION (PCI-S);  Surgeon:  Dorn JINNY Lesches, MD;  Location: [facility] CATH LAB;  Service: Cardiovascular;  Laterality: N/A;   PERCUTANEOUS CORONARY STENT INTERVENTION (PCI-S)  05/27/2014   Procedure: PERCUTANEOUS CORONARY STENT INTERVENTION (PCI-S);  Surgeon: Dorn JINNY Lesches, MD; 3 mm x 12 mm long Xience Xpedition DES to the proximal LAD   SAVORY DILATION N/A 06/13/2015   Procedure: SAVORY DILATION;  Surgeon: Belvie Just, MD;  Location: [facility] ENDOSCOPY;  Service: Endoscopy;  Laterality: N/A;   SAVORY DILATION N/A 04/07/2018   Procedure: SAVORY DILATION;  Surgeon: Just Belvie, MD;  Location: [facility] ENDOSCOPY;  Service: Endoscopy;  Laterality: N/A;   TEE WITHOUT CARDIOVERSION N/A 08/17/2017   Procedure: TRANSESOPHAGEAL  ECHOCARDIOGRAM (TEE);  Surgeon: Kerrin Elspeth BROCKS, MD;  Location: [facility] OR;  Service: Open Heart Surgery;  Laterality: N/A;   TEE WITHOUT CARDIOVERSION N/A 07/16/2021   Procedure: TRANSESOPHAGEAL ECHOCARDIOGRAM (TEE);  Surgeon: Santo Stanly LABOR, MD;  Location: [facility] ENDOSCOPY;  Service: Cardiovascular;  Laterality: N/A;   TONSILLECTOMY     TOTAL KNEE ARTHROPLASTY Left 2003   TOTAL KNEE REVISION Left 11/05/2013   Procedure: LEFT TOTAL KNEE RESECTION;  Surgeon: Donnice JONETTA Car, MD;  Location: [facility] ORS;  Service: Orthopedics;  Laterality: Left;   TOTAL KNEE REVISION Left 2007   opened in 2006 and cleaned out, 2007 revision   TOTAL KNEE REVISION Left 12/31/2013   Procedure: RE-INPLANTATION LEFT TOTAL KNEE ;  Surgeon: Donnice JONETTA Car, MD;  Location: [facility] ORS;  Service: Orthopedics;  Laterality: Left;   VIDEO ASSISTED THORACOSCOPY (VATS)/ LOBECTOMY  01/2009   Social History:  reports that she quit smoking about 39 years ago. Her smoking use included cigarettes. She started smoking about 59 years ago. She has a 20 pack-year smoking history. She has never been exposed to tobacco smoke. She has never used smokeless tobacco. She reports that she does not drink alcohol and does not use drugs.  Allergies  Allergen Reactions   Levofloxacin Itching   Morphine  And Codeine Itching and Other (See Comments)    Pt reports oxycodone  history with no allergy.    Family History  Problem Relation Age of Onset   Hypertension Mother    Coronary artery disease Brother    Hypertension Sister     Prior to Admission medications   Medication Sig Start Date End Date Taking? Authorizing Provider  acetaminophen  (TYLENOL ) 325 MG tablet Take 2 tablets (650 mg total) by mouth every 6 (six) hours as needed for mild pain (or Fever >/= 101). 04/12/23  Yes Sheikh, Omair Latif, DO  Cholecalciferol  (VITAMIN D3) 50 MCG (2000 UT) TABS Take 2,000 Units by mouth daily.   Yes [provider]  furosemide  (LASIX ) 40 MG tablet Take  40 mg daily until weight is 244 pounds. Once weight reaches 244, take 40 mg every other day. 08/23/23  Yes Croitoru, Mihai, MD  insulin  NPH-regular Human (70-30) 100 UNIT/ML injection Inject 20 Units into the skin 2 (two) times daily with a meal. Patient taking differently: Inject 20-50 Units into the skin 2 (two) times daily with a meal. 50 Units in the AM and PRN in the PM 10/08/22 07/02/24 Yes Arrien, Elidia Sieving, MD  Lancets [facility] DELICA PLUS [place]) MISC Apply topically. 08/13/20  Yes [provider]  lisinopril  (ZESTRIL ) 10 MG tablet Take 1 tablet (10 mg total) by mouth daily. 04/12/23 07/02/24 Yes Sheikh, Omair Latif, DO  metoCLOPramide  (REGLAN ) 10 MG tablet Take 10 mg by mouth 4 (four) times daily. 01/04/24  Yes [provider]  metoprolol  succinate (  TOPROL -XL) 50 MG 24 hr tablet Take 1 tablet (50 mg total) by mouth daily. Take with or immediately following a meal. 11/03/23  Yes Croitoru, Mihai, MD  metoprolol  tartrate (LOPRESSOR ) 25 MG tablet Take 1 table as needed for heart rate above 120bpm. 11/03/23  Yes Croitoru, Mihai, MD  ONETOUCH VERIO test strip 1 each 2 (two) times daily. 10/11/20  Yes [provider]  pantoprazole  (PROTONIX ) 40 MG tablet Take 40 mg by mouth daily. 03/05/24  Yes [provider]  rosuvastatin  (CRESTOR ) 40 MG tablet Take 40 mg by mouth daily.   Yes [provider]  spironolactone  (ALDACTONE ) 25 MG tablet Take 1 tablet (25 mg total) by mouth daily. Take 25 mg daily until your weight is 244 pounds. Once your weight reaches 244 pounds, then take 25 mg every other day. 08/23/23  Yes Croitoru, Mihai, MD  warfarin (COUMADIN ) 7.5 MG tablet TAKE 1/2 TABLET TO 1 TABLET BY MOUTH DAILY AS DIRECTED BY COUMADIN  CLINIC Patient taking differently: Take 3.75-7.5 mg by mouth as directed. 3.75 MG : Monday And Wednesday 7.5 MG : Tues, Thurs, Fri, Sat, Sun 06/05/24  Yes Croitoru, Mihai, MD  Blood Glucose Monitoring Suppl (ONETOUCH VERIO FLEX  SYSTEM) w/Device KIT  07/23/20   [provider]  diclofenac  Sodium (VOLTAREN ) 1 % GEL Apply 2 g topically 4 (four) times daily. Right knee Patient not taking: Reported on 07/02/2024 10/08/22   Arrien, Elidia Sieving, MD  sucralfate  (CARAFATE ) 1 g tablet Take 1 tablet (1 g total) by mouth 4 (four) times daily -  with meals and at bedtime. Patient not taking: Reported on 07/02/2024 07/26/23   Garrick Charleston, MD    Physical Exam: Vitals:   07/02/24 0645 07/02/24 0700 07/02/24 0715 07/02/24 0730  BP: 139/62 117/60 (!) 145/57 127/73  Pulse: 81 79 98 89  Resp: 16     Temp:      TempSrc:      SpO2: 97% 98% 99% 97%   Physical Exam Vitals reviewed.  Constitutional:      General: She is awake. She is not in acute distress.    Appearance: She is morbidly obese. She is ill-appearing.  HENT:     Head: Normocephalic.     Nose: No rhinorrhea.     Mouth/Throat:     Mouth: Mucous membranes are moist.  Eyes:     General: No scleral icterus.    Pupils: Pupils are equal, round, and reactive to light.  Neck:     Vascular: No JVD.  Cardiovascular:     Rate and Rhythm: Rhythm irregular.     Heart sounds: S1 normal and S2 normal.  Pulmonary:     Effort: Pulmonary effort is normal.     Breath sounds: Normal breath sounds. No wheezing, rhonchi or rales.  Abdominal:     General: Bowel sounds are normal. There is no distension.     Palpations: Abdomen is soft.     Tenderness: There is no abdominal tenderness. There is no right CVA tenderness or left CVA tenderness.  Musculoskeletal:     Cervical back: Neck supple.     Right lower leg: Edema present.     Left lower leg: Edema present.  Skin:    General: Skin is warm and dry.  Neurological:     General: No focal deficit present.     Mental Status: She is alert and oriented to person, place, and time.  Psychiatric:        Mood and Affect: Mood  normal.        Behavior: Behavior normal. Behavior is cooperative.      Data  Reviewed:  Results are pending, will review when available. 07/15/2021 echocardiogram report. IMPRESSIONS:   1. Left ventricular ejection fraction, by estimation, is 55 to 60%. The  left ventricle has normal function. The left ventricle has no regional  wall motion abnormalities. There is moderate asymmetric left ventricular  hypertrophy of the septal segment.  Left ventricular diastolic parameters are indeterminate.   2. Right ventricular systolic function is mildly reduced. The right  ventricular size is mildly enlarged.   3. Right atrial size was mildly dilated.   4. The mitral valve is normal in structure. No evidence of mitral valve  regurgitation. No evidence of mitral stenosis.   5. Tricuspid valve regurgitation is moderate.   6. The aortic valve is normal in structure. Aortic valve regurgitation is  not visualized. Mild aortic valve sclerosis is present, with no evidence  of aortic valve stenosis.   7. The inferior vena cava is normal in size with greater than 50%  respiratory variability, suggesting right atrial pressure of 3 mmHg.   Assessment and Plan: Principal Problem:   Cellulitis of lower extremities Admit to MedSurg/inpatient. Continue IV fluids. Analgesics as needed. Continue cefepime 2 g every 8 hours.   Continue vancomycin  per pharmacy. Follow-up blood culture and sensitivity Follow CBC and CMP in a.m. Patient to follow-up at the wound care center as an outpatient. The patient mobility is limited to wheelchair. -She uses a walker to transfer from bed to the wheelchair. -Will consult PT and OT tomorrow morning.  Active Problems:   Chronic diastolic CHF (congestive heart failure) (HCC) Hold lisinopril  and furosemide  today due to mildly increased creatinine. Will resume tomorrow morning. Continue beta-blocker and spironolactone .    Type 2 diabetes mellitus with hyperlipidemia (HCC) Carbohydrate modified diet. Continue NPH insulin  50 units SQ daily. CBG  monitoring with RI SS. Check hemoglobin A1c.    Essential hypertension Continue metoprolol  succinate 50 mg p.o. daily.    Paroxysmal atrial fibrillation (HCC) Continue metoprolol  for rate control. Holding warfarin due to supratherapeutic INR.    Supratherapeutic INR Follow PT/INR daily.    CKD (chronic kidney disease) stage 3b, GFR 30-59 ml/min (HCC) Monitor renal function and electrolytes.    Class 3 obesity (HCC) Current BMI 47.49.  Kg/m. Would benefit from lifestyle modifications. Follow-up closely with PCP and/or bariatric clinic.    Anemia, normocytic Monitor hematocrit and hemoglobin.    Esophageal reflux Continue pantoprazole  40 mg p.o. daily.SABRA    CAD S/P percutaneous coronary angioplasty Continue beta-blocker and statin. No ASA therapy while on warfarin.    Hypercholesterolemia Continue rosuvastatin  40 mg p.o. daily.    Advance Care Planning:   Code Status: Full Code   Consults:   Family Communication:   Severity of Illness: The appropriate patient status for this patient is OBSERVATION. Observation status is judged to be reasonable and necessary in order to provide the required intensity of service to ensure the patient's safety. The patient's presenting symptoms, physical exam findings, and initial radiographic and laboratory data in the context of their medical condition is felt to place them at decreased risk for further clinical deterioration. Furthermore, it is anticipated that the patient will be medically stable for discharge from the hospital within 2 midnights of admission.   Author: Alm Dorn Castor, MD 07/02/2024 8:14 AM  For on call review [email or web address].   This document was prepared using [employer]  software and may contain some unintended transcription errors.

## 2024-07-02 NOTE — Plan of Care (Signed)

## 2024-07-02 NOTE — ED Provider Notes (Signed)
 [place] EMERGENCY DEPARTMENT AT [facility] Provider Note   CSN: [id number] Arrival date & time: 07/01/24  2311     Patient presents with: Leg Pain   [name] is a 73 y.o. female.   Patient with a history of hypertension, PVD, atrial fibrillation on Coumadin , diabetes, lung cancer, CHF presents with wound to her right leg.  Does have lymphedema and is nonambulatory at baseline but transitions from wheelchair to lift chair.  Today EMS was called because she was having difficulty getting up from her lift chair due to pain in her right leg.  Did not fall or hit her head.  Does have chronic leg swelling and lymphedema which is unchanged.  Was recently seen at wound center and has a draining wound to her right posterior leg that is foul-smelling.  No fever.  Family also concerned about foul-smelling urine.  Denies chest pain or shortness of breath.  Denies abdominal pain, nausea, vomiting, diarrhea. States she always has pain and swelling in her legs but is worse today and she called EMS because she almost fell.  Did not hit her head or lose consciousness.  No head, neck, back, chest or abdominal pain.  No shortness of breath  The history is provided by the patient and the EMS personnel.  Leg Pain Associated symptoms: no fever        Prior to Admission medications   Medication Sig Start Date End Date Taking? Authorizing Provider  acetaminophen  (TYLENOL ) 325 MG tablet Take 2 tablets (650 mg total) by mouth every 6 (six) hours as needed for mild pain (or Fever >/= 101). 04/12/23   Sheikh, Omair Latif, DO  Blood Glucose Monitoring Suppl (ONETOUCH VERIO FLEX SYSTEM) w/Device KIT  07/23/20   [provider]  Cholecalciferol  (VITAMIN D3) 50 MCG (2000 UT) TABS Take 2,000 Units by mouth daily.    [provider]  diclofenac  Sodium (VOLTAREN ) 1 % GEL Apply 2 g topically 4 (four) times daily. Right knee 10/08/22   Arrien, Elidia Sieving, MD  furosemide  (LASIX )  40 MG tablet Take 40 mg daily until weight is 244 pounds. Once weight reaches 244, take 40 mg every other day. 08/23/23   Croitoru, Mihai, MD  insulin  NPH-regular Human (70-30) 100 UNIT/ML injection Inject 20 Units into the skin 2 (two) times daily with a meal. Patient taking differently: Inject 20-50 Units into the skin 2 (two) times daily with a meal. 10/08/22 06/08/24  Arrien, Elidia Sieving, MD  Lancets [facility] DELICA PLUS [place]) MISC Apply topically. 08/13/20   [provider]  lisinopril  (ZESTRIL ) 10 MG tablet Take 1 tablet (10 mg total) by mouth daily. 04/12/23 06/08/24  Sherrill Cable Latif, DO  metoCLOPramide  (REGLAN ) 10 MG tablet Take 10 mg by mouth 4 (four) times daily. 01/04/24   [provider]  metoprolol  succinate (TOPROL -XL) 50 MG 24 hr tablet Take 1 tablet (50 mg total) by mouth daily. Take with or immediately following a meal. 11/03/23   Croitoru, Mihai, MD  metoprolol  tartrate (LOPRESSOR ) 25 MG tablet Take 1 table as needed for heart rate above 120bpm. 11/03/23   Croitoru, Mihai, MD  [facility] VERIO test strip 1 each 2 (two) times daily. 10/11/20   [provider]  pantoprazole  (PROTONIX ) 40 MG tablet Take 40 mg by mouth daily. 03/05/24   [provider]  rosuvastatin  (CRESTOR ) 40 MG tablet Take 40 mg by mouth daily.    [provider]  spironolactone  (ALDACTONE ) 25 MG tablet Take 1 tablet (  25 mg total) by mouth daily. Take 25 mg daily until your weight is 244 pounds. Once your weight reaches 244 pounds, then take 25 mg every other day. 08/23/23   Croitoru, Mihai, MD  sucralfate  (CARAFATE ) 1 g tablet Take 1 tablet (1 g total) by mouth 4 (four) times daily -  with meals and at bedtime. 07/26/23   Garrick Charleston, MD  warfarin (COUMADIN ) 7.5 MG tablet TAKE 1/2 TABLET TO 1 TABLET BY MOUTH DAILY AS DIRECTED BY COUMADIN  CLINIC 06/05/24   Croitoru, Mihai, MD    Allergies: Levofloxacin and Morphine  and codeine    Review of Systems  Constitutional:   Negative for activity change, appetite change and fever.  HENT:  Negative for congestion and rhinorrhea.   Respiratory:  Negative for chest tightness and shortness of breath.   Cardiovascular:  Negative for chest pain.  Gastrointestinal:  Negative for abdominal pain, nausea and vomiting.  Genitourinary:  Negative for dysuria and menstrual problem.  Musculoskeletal:  Positive for arthralgias and myalgias.  Skin:  Positive for wound.  Neurological:  Negative for dizziness, weakness and headaches.   all other systems are negative except as noted in the HPI and PMH.    Updated Vital Signs BP 119/60   Pulse 79   Temp 98.4 F (36.9 C) (Oral)   Resp 20   SpO2 98%   Physical Exam Vitals and nursing note reviewed.  Constitutional:      General: She is not in acute distress.    Appearance: She is well-developed. She is obese.  HENT:     Head: Normocephalic and atraumatic.     Mouth/Throat:     Pharynx: No oropharyngeal exudate.  Eyes:     Conjunctiva/sclera: Conjunctivae normal.     Pupils: Pupils are equal, round, and reactive to light.  Neck:     Comments: No meningismus. Cardiovascular:     Rate and Rhythm: Normal rate and regular rhythm.     Heart sounds: Normal heart sounds. No murmur heard. Pulmonary:     Effort: Pulmonary effort is normal. No respiratory distress.     Breath sounds: Normal breath sounds.  Abdominal:     Palpations: Abdomen is soft.     Tenderness: There is no abdominal tenderness. There is no guarding or rebound.  Musculoskeletal:        General: No tenderness. Normal range of motion.     Cervical back: Normal range of motion and neck supple.     Right lower leg: Edema present.     Left lower leg: Edema present.     Comments: Bilateral lower extremity edema.  Intact DP and PT pulses.  Foul-smelling draining wound to right posterior calf as depicted.  No crepitus.  Skin:    General: Skin is warm.  Neurological:     Mental Status: She is alert and  oriented to person, place, and time.     Cranial Nerves: No cranial nerve deficit.     Motor: No abnormal muscle tone.     Coordination: Coordination normal.     Comments:  5/5 strength throughout. CN 2-12 intact.Equal grip strength.   Psychiatric:        Behavior: Behavior normal.     (all labs ordered are listed, but only abnormal results are displayed) Labs Reviewed  URINALYSIS, ROUTINE W REFLEX MICROSCOPIC - Abnormal; Notable for the following components:      Result Value   Hgb urine dipstick MODERATE (*)    Protein, ur 100 (*)  All other components within normal limits  CBC WITH DIFFERENTIAL/PLATELET - Abnormal; Notable for the following components:   Hemoglobin 10.9 (*)    MCH 25.8 (*)    MCHC 29.6 (*)    RDW 16.0 (*)    All other components within normal limits  PROTIME-INR - Abnormal; Notable for the following components:   Prothrombin Time 55.1 (*)    INR 5.9 (*)    All other components within normal limits  COMPREHENSIVE METABOLIC PANEL WITH GFR - Abnormal; Notable for the following components:   Creatinine, Ser 1.30 (*)    Albumin  3.4 (*)    Alkaline Phosphatase 160 (*)    GFR, Estimated 43 (*)    All other components within normal limits  PRO BRAIN NATRIURETIC PEPTIDE - Abnormal; Notable for the following components:   Pro Brain Natriuretic Peptide 2,537.0 (*)    All other components within normal limits  CULTURE, BLOOD (ROUTINE X 2)  CULTURE, BLOOD (ROUTINE X 2)  LACTIC ACID, PLASMA    EKG: None  Radiology: DG Tibia/Fibula Right Result Date: 07/02/2024 CLINICAL DATA:  Leg wound. EXAM: RIGHT TIBIA AND FIBULA - 2 VIEW; PORTABLE CHEST - 1 VIEW COMPARISON:  Chest CT without contrast 08/19/2023, AP Lat chest 04/09/2023, right knee series 10/04/2022, left foot series 04/20/2024. FINDINGS: Chest AP portable 2:09 a.m.: Stable cardiomegaly. CABG changes. Old left upper lobectomy. Central vascular prominence is again seen without overt edema. The lungs are clear.  The mediastinum is stable. There is aortic atherosclerosis. Thoracic spondylosis. No new osseous abnormality. AP Lat right tibia fibula two views in 4 films: Vascular calcifications, heaviest in the superficial femoral artery. There is diffuse stranding edema throughout the foreleg, ankle and foot. No soft tissue gas or visible foreign body. No evidence of tibial/fibular fractures or other focal bone lesions. There is advanced tricompartmental arthrosis of the left knee. Mild arthrosis of the ankle. Vascular calcifications continue throughout the foreleg into the foot. IMPRESSION: 1. Stable cardiomegaly and central vascular prominence without overt edema. 2. No other evidence of acute chest disease. 3. Diffuse stranding edema throughout the right foreleg, ankle and foot. No soft tissue gas or visible foreign body. 4. No evidence of tibial/fibular fractures or other focal bone lesions. 5. Vascular calcifications. 6. Advanced tricompartmental arthrosis of the left knee. Electronically Signed   By: Francis Quam M.D.   On: 07/02/2024 03:07   DG Chest Portable 1 View Result Date: 07/02/2024 CLINICAL DATA:  Leg wound. EXAM: RIGHT TIBIA AND FIBULA - 2 VIEW; PORTABLE CHEST - 1 VIEW COMPARISON:  Chest CT without contrast 08/19/2023, AP Lat chest 04/09/2023, right knee series 10/04/2022, left foot series 04/20/2024. FINDINGS: Chest AP portable 2:09 a.m.: Stable cardiomegaly. CABG changes. Old left upper lobectomy. Central vascular prominence is again seen without overt edema. The lungs are clear. The mediastinum is stable. There is aortic atherosclerosis. Thoracic spondylosis. No new osseous abnormality. AP Lat right tibia fibula two views in 4 films: Vascular calcifications, heaviest in the superficial femoral artery. There is diffuse stranding edema throughout the foreleg, ankle and foot. No soft tissue gas or visible foreign body. No evidence of tibial/fibular fractures or other focal bone lesions. There is advanced  tricompartmental arthrosis of the left knee. Mild arthrosis of the ankle. Vascular calcifications continue throughout the foreleg into the foot. IMPRESSION: 1. Stable cardiomegaly and central vascular prominence without overt edema. 2. No other evidence of acute chest disease. 3. Diffuse stranding edema throughout the right foreleg, ankle and foot. No soft tissue  gas or visible foreign body. 4. No evidence of tibial/fibular fractures or other focal bone lesions. 5. Vascular calcifications. 6. Advanced tricompartmental arthrosis of the left knee. Electronically Signed   By: Francis Quam M.D.   On: 07/02/2024 03:07     Procedures   Medications Ordered in the ED - No data to display                                  Medical Decision Making Amount and/or Complexity of Data Reviewed Labs: ordered. Decision-making details documented in ED Course. Radiology: ordered and independent interpretation performed. Decision-making details documented in ED Course. ECG/medicine tests: ordered and independent interpretation performed. Decision-making details documented in ED Course.  Risk Prescription drug management. Decision regarding hospitalization.   Acute on chronic leg pain and swelling.  No fall or injury.  Vital stable.  Intact distal pulses.  Pain and draining wound to right posterior leg as depicted.  Xrays shows soft tissue swelling without evidence of fracture.  Results reviewed and interpreted by me.  No soft tissue gas or foreign body.  Vital stable.  No fever.  No hypoxia or increased work of breathing.''  Creatinine slightly elevated at 1.3.  INR elevated at 5.9.  No obvious bleeding with stable hemoglobin. Does have vascular congestion on chest x-ray but no hypoxia or shortness of breath.  Echocardiogram in 2022 showed normal ejection fraction.  BNP today elevated over 2500.  Appears to have acute on chronic wound to her right leg with some foul-smelling drainage.  Does attend wound  center.  Her leg swelling is at baseline by her report.  X-ray negative for fracture or soft tissue gas or foreign body.  Intact distal pulses.  Given foul-smelling wound with drainage broad-spectrum antibiotics initiated after cultures obtained.  Lactate is normal.  No significant leukocytosis.  Part of her discomfort in her legs may be due to her edema possible diurese some as well.  Given her elevated INR, low concern for DVT.  Respiratory status remained stable without increased work of breathing or hypoxia.  Admission discussed with Dr. Shona.      Final diagnoses:  None    ED Discharge Orders     None          Jalene Demo, Garnette, MD 07/02/24 [id number]

## 2024-07-02 NOTE — ED Notes (Signed)
 [facility] called for transport.

## 2024-07-02 NOTE — Progress Notes (Signed)
 Transition of Care [facility]) - Inpatient Brief Assessment   Patient Details  Name: [name] MRN: [id number] Date of Birth: 05/29/1951  [facility] CM/SW Contact:    Rosaline JONELLE Joe, RN Phone Number: 07/02/2024, 3:59 PM   Clinical Narrative: Patient admitted with right lower leg pain/ wounds.  Patient lives with son at the home that provides 24 hour supervision since he is disabled per patient.  DME at the home includes Rw, WC and bedside commode that is too small.  I provided Medicare choice regarding DMe company and patient does not have a preference.  [employer] was called to deliver 3: (bariatric sized) to the bedside.  CM will continue to follow the patient for IP Care management needs - patient may need home health RN if patient needs wound care management prior to discharge.   Transition of Care Asessment: Insurance and Status: (P) Insurance coverage has been reviewed Patient has primary care physician: (P) Yes Home environment has been reviewed: (P) from home with son Prior level of function:: (P) self Prior/Current Home Services: (P) Current home services (DME at the home includes Rw, WC, bedside commode that is too small) Social Drivers of Health Review: (P) SDOH reviewed interventions complete Readmission risk has been reviewed: (P) Yes Transition of care needs: (P) transition of care needs identified, TOC will continue to follow

## 2024-07-03 ENCOUNTER — Ambulatory Visit (HOSPITAL_BASED_OUTPATIENT_CLINIC_OR_DEPARTMENT_OTHER): Admitting: Internal Medicine

## 2024-07-03 DIAGNOSIS — L03119 Cellulitis of unspecified part of limb: Secondary | ICD-10-CM | POA: Diagnosis not present

## 2024-07-03 DIAGNOSIS — R531 Weakness: Secondary | ICD-10-CM | POA: Diagnosis not present

## 2024-07-03 DIAGNOSIS — I10 Essential (primary) hypertension: Secondary | ICD-10-CM | POA: Diagnosis not present

## 2024-07-03 DIAGNOSIS — N179 Acute kidney failure, unspecified: Secondary | ICD-10-CM | POA: Diagnosis not present

## 2024-07-03 DIAGNOSIS — I5032 Chronic diastolic (congestive) heart failure: Secondary | ICD-10-CM | POA: Diagnosis not present

## 2024-07-03 DIAGNOSIS — R791 Abnormal coagulation profile: Secondary | ICD-10-CM | POA: Diagnosis not present

## 2024-07-03 DIAGNOSIS — I517 Cardiomegaly: Secondary | ICD-10-CM | POA: Diagnosis not present

## 2024-07-03 DIAGNOSIS — D631 Anemia in chronic kidney disease: Secondary | ICD-10-CM | POA: Diagnosis not present

## 2024-07-03 DIAGNOSIS — E66813 Obesity, class 3: Secondary | ICD-10-CM | POA: Diagnosis not present

## 2024-07-03 DIAGNOSIS — N1832 Chronic kidney disease, stage 3b: Secondary | ICD-10-CM

## 2024-07-03 DIAGNOSIS — M6281 Muscle weakness (generalized): Secondary | ICD-10-CM | POA: Diagnosis not present

## 2024-07-03 DIAGNOSIS — Z951 Presence of aortocoronary bypass graft: Secondary | ICD-10-CM | POA: Diagnosis not present

## 2024-07-03 DIAGNOSIS — Z8249 Family history of ischemic heart disease and other diseases of the circulatory system: Secondary | ICD-10-CM | POA: Diagnosis not present

## 2024-07-03 DIAGNOSIS — L0390 Cellulitis, unspecified: Secondary | ICD-10-CM | POA: Diagnosis not present

## 2024-07-03 DIAGNOSIS — L03116 Cellulitis of left lower limb: Secondary | ICD-10-CM | POA: Diagnosis not present

## 2024-07-03 DIAGNOSIS — E7800 Pure hypercholesterolemia, unspecified: Secondary | ICD-10-CM | POA: Diagnosis not present

## 2024-07-03 DIAGNOSIS — R262 Difficulty in walking, not elsewhere classified: Secondary | ICD-10-CM | POA: Diagnosis not present

## 2024-07-03 DIAGNOSIS — I890 Lymphedema, not elsewhere classified: Secondary | ICD-10-CM | POA: Diagnosis not present

## 2024-07-03 DIAGNOSIS — Z794 Long term (current) use of insulin: Secondary | ICD-10-CM | POA: Diagnosis not present

## 2024-07-03 DIAGNOSIS — I130 Hypertensive heart and chronic kidney disease with heart failure and stage 1 through stage 4 chronic kidney disease, or unspecified chronic kidney disease: Secondary | ICD-10-CM | POA: Diagnosis not present

## 2024-07-03 DIAGNOSIS — E1122 Type 2 diabetes mellitus with diabetic chronic kidney disease: Secondary | ICD-10-CM | POA: Diagnosis not present

## 2024-07-03 DIAGNOSIS — K219 Gastro-esophageal reflux disease without esophagitis: Secondary | ICD-10-CM | POA: Diagnosis not present

## 2024-07-03 DIAGNOSIS — E1151 Type 2 diabetes mellitus with diabetic peripheral angiopathy without gangrene: Secondary | ICD-10-CM | POA: Diagnosis not present

## 2024-07-03 DIAGNOSIS — D500 Iron deficiency anemia secondary to blood loss (chronic): Secondary | ICD-10-CM | POA: Diagnosis not present

## 2024-07-03 DIAGNOSIS — I2510 Atherosclerotic heart disease of native coronary artery without angina pectoris: Secondary | ICD-10-CM | POA: Diagnosis not present

## 2024-07-03 DIAGNOSIS — Z87891 Personal history of nicotine dependence: Secondary | ICD-10-CM | POA: Diagnosis not present

## 2024-07-03 DIAGNOSIS — E1142 Type 2 diabetes mellitus with diabetic polyneuropathy: Secondary | ICD-10-CM | POA: Diagnosis not present

## 2024-07-03 DIAGNOSIS — E1169 Type 2 diabetes mellitus with other specified complication: Secondary | ICD-10-CM | POA: Diagnosis not present

## 2024-07-03 DIAGNOSIS — Z79899 Other long term (current) drug therapy: Secondary | ICD-10-CM | POA: Diagnosis not present

## 2024-07-03 DIAGNOSIS — Z7401 Bed confinement status: Secondary | ICD-10-CM | POA: Diagnosis not present

## 2024-07-03 DIAGNOSIS — E113493 Type 2 diabetes mellitus with severe nonproliferative diabetic retinopathy without macular edema, bilateral: Secondary | ICD-10-CM | POA: Diagnosis not present

## 2024-07-03 DIAGNOSIS — E8809 Other disorders of plasma-protein metabolism, not elsewhere classified: Secondary | ICD-10-CM | POA: Diagnosis not present

## 2024-07-03 DIAGNOSIS — I252 Old myocardial infarction: Secondary | ICD-10-CM | POA: Diagnosis not present

## 2024-07-03 DIAGNOSIS — I5033 Acute on chronic diastolic (congestive) heart failure: Secondary | ICD-10-CM | POA: Diagnosis not present

## 2024-07-03 DIAGNOSIS — S81801A Unspecified open wound, right lower leg, initial encounter: Secondary | ICD-10-CM | POA: Diagnosis not present

## 2024-07-03 DIAGNOSIS — Z6841 Body Mass Index (BMI) 40.0 and over, adult: Secondary | ICD-10-CM | POA: Diagnosis not present

## 2024-07-03 DIAGNOSIS — Z955 Presence of coronary angioplasty implant and graft: Secondary | ICD-10-CM | POA: Diagnosis not present

## 2024-07-03 DIAGNOSIS — I739 Peripheral vascular disease, unspecified: Secondary | ICD-10-CM | POA: Diagnosis not present

## 2024-07-03 DIAGNOSIS — G4733 Obstructive sleep apnea (adult) (pediatric): Secondary | ICD-10-CM | POA: Diagnosis not present

## 2024-07-03 DIAGNOSIS — L03115 Cellulitis of right lower limb: Secondary | ICD-10-CM | POA: Diagnosis not present

## 2024-07-03 DIAGNOSIS — I480 Paroxysmal atrial fibrillation: Secondary | ICD-10-CM | POA: Diagnosis not present

## 2024-07-03 DIAGNOSIS — M1712 Unilateral primary osteoarthritis, left knee: Secondary | ICD-10-CM | POA: Diagnosis not present

## 2024-07-03 DIAGNOSIS — R600 Localized edema: Secondary | ICD-10-CM | POA: Diagnosis not present

## 2024-07-03 DIAGNOSIS — Z7901 Long term (current) use of anticoagulants: Secondary | ICD-10-CM | POA: Diagnosis not present

## 2024-07-03 DIAGNOSIS — M47894 Other spondylosis, thoracic region: Secondary | ICD-10-CM | POA: Diagnosis not present

## 2024-07-03 LAB — GLUCOSE, CAPILLARY
Glucose-Capillary: 141 mg/dL — ABNORMAL HIGH (ref 70–99)
Glucose-Capillary: 104 mg/dL — ABNORMAL HIGH (ref 70–99)
Glucose-Capillary: 139 mg/dL — ABNORMAL HIGH (ref 70–99)
Glucose-Capillary: 144 mg/dL — ABNORMAL HIGH (ref 70–99)

## 2024-07-03 LAB — PROTIME-INR
INR: 5.9 (ref 0.8–1.2)
Prothrombin Time: 55.2 s — ABNORMAL HIGH (ref 11.4–15.2)

## 2024-07-03 MED ORDER — FUROSEMIDE 40 MG PO TABS
40.0000 mg | ORAL_TABLET | Freq: Two times a day (BID) | ORAL | Status: DC
Start: 2024-07-03 — End: 2024-07-04
  Administered 2024-07-03: 40 mg via ORAL
  Filled 2024-07-03: qty 1

## 2024-07-03 NOTE — Plan of Care (Signed)

## 2024-07-03 NOTE — Evaluation (Signed)
 Occupational Therapy Evaluation Patient Details Name: [name] MRN: [id number] DOB: 08-Feb-1951 Today's Date: 07/03/2024   History of Present Illness   43F with a Hx of GERD/hiatal hernia, DM 2, HLD, HTN, CAD with LAD stent, P A-fib on warfarin, HFpEF, PVD, OSA not on CPAP, history of lung cancer and LUL VATS, chronic lower extremity wounds, presenting with worsening BL LE pain and redness.     Clinical Impressions Patient admitted for the diagnosis above.  PTA she lives at home with her son, and has a PCA for 1 hour 3x/wk to assist with bathing and dressing.  Sounds as if she was able to perform stand pivot transfers with her son, but did not participate with iADL or meal prep.  Deficits impacting independence are listed below.  Patient needs heavy Max A of two to stand bedside, and unable to weight shift or take steps to the recliner.  OT will continue efforts in the acute setting to address deficits and Patient will benefit from continued inpatient follow up therapy, <3 hours/day.     If plan is discharge home, recommend the following:   Assist for transportation;Assistance with cooking/housework;Two people to help with walking and/or transfers;A lot of help with bathing/dressing/bathroom     Functional Status Assessment   Patient has had a recent decline in their functional status and demonstrates the ability to make significant improvements in function in a reasonable and predictable amount of time.     Equipment Recommendations   None recommended by OT     Recommendations for Other Services         Precautions/Restrictions   Precautions Precautions: Fall Recall of Precautions/Restrictions: Intact Restrictions Weight Bearing Restrictions Per Provider Order: No Other Position/Activity Restrictions: Prefers to wear shoes with transfers     Mobility Bed Mobility Overal bed mobility: Needs Assistance Bed Mobility: Supine to Sit, Sit to Supine      Supine to sit: Max assist, +2 for physical assistance Sit to supine: Max assist, +2 for physical assistance        Transfers Overall transfer level: Needs assistance   Transfers: Sit to/from Stand Sit to Stand: Max assist, +2 physical assistance           General transfer comment: unable to take steps or weight shift      Balance Overall balance assessment: Needs assistance Sitting-balance support: Feet supported Sitting balance-Leahy Scale: Fair     Standing balance support: Reliant on assistive device for balance, Bilateral upper extremity supported Standing balance-Leahy Scale: Poor                             ADL either performed or assessed with clinical judgement   ADL Overall ADL's : Needs assistance/impaired Eating/Feeding: Set up;Bed level   Grooming: Wash/dry hands;Wash/dry face;Supervision/safety;Sitting   Upper Body Bathing: Minimal assistance;Moderate assistance;Sitting   Lower Body Bathing: Total assistance;Bed level   Upper Body Dressing : Minimal assistance;Sitting   Lower Body Dressing: Total assistance;Bed level       Toileting- Clothing Manipulation and Hygiene: Total assistance;Bed level               Vision Patient Visual Report: No change from baseline       Perception Perception: Not tested       Praxis Praxis: Not tested       Pertinent Vitals/Pain Pain Assessment Pain Assessment: Faces Faces Pain Scale: Hurts even more Pain Location: knee Pain Descriptors /  Indicators: Aching, Discomfort Pain Intervention(s): Premedicated before session     Extremity/Trunk Assessment Upper Extremity Assessment Upper Extremity Assessment: Generalized weakness;Right hand dominant   Lower Extremity Assessment Lower Extremity Assessment: Defer to PT evaluation   Cervical / Trunk Assessment Cervical / Trunk Assessment: Other exceptions Cervical / Trunk Exceptions: Body habitus   Communication  Communication Communication: No apparent difficulties   Cognition Arousal: Alert Behavior During Therapy: WFL for tasks assessed/performed Cognition: No apparent impairments                               Following commands: Intact       Cueing  General Comments   Cueing Techniques: Verbal cues   VSS on RA   Exercises     Shoulder Instructions      Home Living Family/patient expects to be discharged to:: Private residence Living Arrangements: Children Available Help at Discharge: Family;Personal care attendant Type of Home: House Home Access: Level entry;Ramped entrance     Home Layout: One level     Bathroom Shower/Tub: Sponge bathes at baseline   Bathroom Toilet: Handicapped height Bathroom Accessibility: Yes How Accessible: Accessible via walker Home Equipment: Wheelchair - manual;Hospital bed;BSC/3in1          Prior Functioning/Environment Prior Level of Function : Needs assist             Mobility Comments: stand pivot to wheelchair from hospital bed, able to use hands to self propel ADLs Comments: son helps to get to doctor, has an aide for washing up (sponge bath) 3 days a week for about an hour, pt pays her out of pocket    OT Problem List: Decreased strength;Decreased range of motion;Decreased activity tolerance;Impaired balance (sitting and/or standing);Pain   OT Treatment/Interventions: Self-care/ADL training;Therapeutic activities;Patient/family education;Balance training;DME and/or AE instruction      OT Goals(Current goals can be found in the care plan section)   Acute Rehab OT Goals Patient Stated Goal: Return home OT Goal Formulation: With patient Time For Goal Achievement: 07/17/24 Potential to Achieve Goals: Good ADL Goals Pt Will Perform Grooming: with set-up;sitting Pt Will Perform Upper Body Bathing: with set-up;sitting Pt Will Perform Upper Body Dressing: with set-up;sitting Pt Will Transfer to Toilet: with mod  assist;stand pivot transfer;bedside commode   OT Frequency:  Min 2X/week    Co-evaluation PT/OT/SLP Co-Evaluation/Treatment: Yes Reason for Co-Treatment: Complexity of the patient's impairments (multi-system involvement)   OT goals addressed during session: ADL's and self-care      AM-PAC OT 6 Clicks Daily Activity     Outcome Measure Help from another person eating meals?: None Help from another person taking care of personal grooming?: A Little Help from another person toileting, which includes using toliet, bedpan, or urinal?: Total Help from another person bathing (including washing, rinsing, drying)?: A Lot Help from another person to put on and taking off regular upper body clothing?: A Lot Help from another person to put on and taking off regular lower body clothing?: Total 6 Click Score: 13   End of Session Equipment Utilized During Treatment: Gait belt;Rolling walker (2 wheels) Nurse Communication: Mobility status  Activity Tolerance: Patient tolerated treatment well Patient left: in bed;with call bell/phone within reach;with nursing/sitter in room  OT Visit Diagnosis: Unsteadiness on feet (R26.81);Muscle weakness (generalized) (M62.81);Pain Pain - Right/Left: Right Pain - part of body: Ankle and joints of foot  Time: 8854-8779 OT Time Calculation (min): 35 min Charges:  OT General Charges $OT Visit: 1 Visit OT Evaluation $OT Eval Moderate Complexity: 1 Mod  07/03/2024  RP, OTR/L  Acute Rehabilitation Services  Office:  [phone]   Charlie JONETTA Halsted 07/03/2024, 12:53 PM

## 2024-07-03 NOTE — Evaluation (Signed)
 Physical Therapy Evaluation Patient Details Name: [name] MRN: [id number] DOB: [birth year] Today's Date: 07/03/2024  History of Present Illness  45F with a Hx of GERD/hiatal hernia, DM 2, HLD, HTN, CAD with LAD stent, P A-fib on warfarin, HFpEF, PVD, OSA not on CPAP, history of lung cancer and LUL VATS, chronic lower extremity wounds, presenting with worsening BL LE pain and redness.  Clinical Impression  Patient presents with decreased mobility due to generalized weakness, pain, imbalance and difficulty with ROM and strength in LE's.  Previously living at home with son and transferring to wheelchair or [facility] on her own.  She had aide 3 days a week for bathing.  She needed max A of 2 today for 4 standing trials poorly tolerated with R knee pain and needing +2 for bed mobility as well.  Feel she will benefit from skilled PT in the acute setting and from post-acute inpatient rehab (<3 hours/day) prior to d/c home.        If plan is discharge home, recommend the following: Two people to help with walking and/or transfers;Two people to help with bathing/dressing/bathroom   Can travel by private vehicle   No    Equipment Recommendations BSC/3in1 (bariatric)  Recommendations for Other Services       Functional Status Assessment Patient has had a recent decline in their functional status and demonstrates the ability to make significant improvements in function in a reasonable and predictable amount of time.     Precautions / Restrictions Precautions Precautions: Fall Recall of Precautions/Restrictions: Intact Restrictions Other Position/Activity Restrictions: Prefers to wear shoes with transfers      Mobility  Bed Mobility Overal bed mobility: Needs Assistance Bed Mobility: Supine to Sit, Sit to Supine     Supine to sit: Max assist, +2 for physical assistance Sit to supine: Max assist, +2 for physical assistance   General bed mobility comments: increased time and pt using  rails to start, then needing +2 to lift trunk and scoot hips    Transfers Overall transfer level: Needs assistance   Transfers: Sit to/from Stand Sit to Stand: Max assist, +2 physical assistance           General transfer comment: performed x 4 to attempt hygiene, bed change and for balance; could not attempt to step to recliner today    Ambulation/Gait               General Gait Details: unable to take steps today  Stairs            Wheelchair Mobility     Tilt Bed    Modified Rankin (Stroke Patients Only)       Balance Overall balance assessment: Needs assistance Sitting-balance support: Feet supported Sitting balance-Leahy Scale: Fair     Standing balance support: Reliant on assistive device for balance, Bilateral upper extremity supported Standing balance-Leahy Scale: Poor Standing balance comment: heavy UE support and 2 A for standing, up to maybe 15 seconds after three trials to move bed down and change linen over several standing trials                             Pertinent Vitals/Pain Pain Assessment Pain Assessment: Faces Pain Score: 6  Faces Pain Scale: Hurts even more Pain Location: R knee Pain Descriptors / Indicators: Aching, Discomfort Pain Intervention(s): Monitored during session, Limited activity within patient's tolerance, Premedicated before session    Home Living Family/patient expects to  be discharged to:: Private residence Living Arrangements: Children Available Help at Discharge: Family;Personal care attendant Type of Home: House Home Access: Level entry;Ramped entrance       Home Layout: One level Home Equipment: Wheelchair - manual;Hospital bed;BSC/3in1      Prior Function Prior Level of Function : Needs assist             Mobility Comments: stand pivot to wheelchair from hospital bed, able to use hands to self propel ADLs Comments: son helps to get to doctor, has an aide for washing up (sponge  bath) 3 days a week for about an hour, pt pays her out of pocket     Extremity/Trunk Assessment   Upper Extremity Assessment Upper Extremity Assessment: Defer to OT evaluation    Lower Extremity Assessment Lower Extremity Assessment: RLE deficits/detail;LLE deficits/detail RLE Deficits / Details: significant knee pain pt relates bone on bone changes, can flex minimally in supine, in sitting noted maybe 70 degrees knee flexion max, moving leg to the edge of the bed with increased time and effort RLE: Unable to fully assess due to pain RLE Sensation: WNL LLE Deficits / Details: Healed scar on medial ankle and on knee with pt relates 5 surgeries on the knee due to infection and revisions; flexes knee to about 30 in supine, maybe 80 in sitting, ankle WFL LLE Sensation: WNL    Cervical / Trunk Assessment Cervical / Trunk Assessment: Other exceptions Cervical / Trunk Exceptions: Body habitus  Communication   Communication Communication: No apparent difficulties    Cognition Arousal: Alert Behavior During Therapy: WFL for tasks assessed/performed   PT - Cognitive impairments: No apparent impairments                         Following commands: Intact       Cueing Cueing Techniques: Verbal cues     General Comments General comments (skin integrity, edema, etc.): incontinent of urine at baseline, NT changed purewick    Exercises     Assessment/Plan    PT Assessment Patient needs continued PT services  PT Problem List Decreased strength;Decreased range of motion;Decreased activity tolerance;Decreased balance;Decreased mobility;Pain       PT Treatment Interventions DME instruction;Functional mobility training;Therapeutic activities;Therapeutic exercise;Balance training;Wheelchair mobility training;Patient/family education    PT Goals (Current goals can be found in the Care Plan section)  Acute Rehab PT Goals Patient Stated Goal: agreeable to rehab if it will  help PT Goal Formulation: With patient Time For Goal Achievement: 07/17/24 Potential to Achieve Goals: Good    Frequency Min 2X/week     Co-evaluation PT/OT/SLP Co-Evaluation/Treatment: Yes Reason for Co-Treatment: Complexity of the patient's impairments (multi-system involvement);For patient/therapist safety;To address functional/ADL transfers PT goals addressed during session: Mobility/safety with mobility;Balance;Proper use of DME OT goals addressed during session: ADL's and self-care       AM-PAC PT 6 Clicks Mobility  Outcome Measure Help needed turning from your back to your side while in a flat bed without using bedrails?: Total Help needed moving from lying on your back to sitting on the side of a flat bed without using bedrails?: Total Help needed moving to and from a bed to a chair (including a wheelchair)?: Total Help needed standing up from a chair using your arms (e.g., wheelchair or bedside chair)?: Total Help needed to walk in hospital room?: Total Help needed climbing 3-5 steps with a railing? : Total 6 Click Score: 6    End of Session  Equipment Utilized During Treatment: Gait belt Activity Tolerance: Patient limited by pain Patient left: in bed;with call bell/phone within reach;with nursing/sitter in room Nurse Communication: Mobility status PT Visit Diagnosis: Other abnormalities of gait and mobility (R26.89);Muscle weakness (generalized) (M62.81);Difficulty in walking, not elsewhere classified (R26.2);Pain Pain - Right/Left: Right Pain - part of body: Knee    Time: 8854-8779 PT Time Calculation (min) (ACUTE ONLY): 35 min   Charges:   PT Evaluation $PT Eval Moderate Complexity: 1 Mod   PT General Charges $$ ACUTE PT VISIT: 1 Visit         Micheline Portal, PT Acute Rehabilitation Services Office:[phone] 07/03/2024   Montie Portal 07/03/2024, 1:41 PM

## 2024-07-03 NOTE — Progress Notes (Signed)
 Progress Note   Patient: [name] FMW:[id number] DOB: 1951-03-30 DOA: 07/01/2024  DOS: the patient was seen and examined on 07/03/2024   Brief hospital course:  20F with a Hx of GERD/hiatal hernia, DM 2, HLD, HTN, CAD with LAD stent, P A-fib on warfarin, HFpEF, PVD, OSA not on CPAP, history of lung cancer and LUL VATS, chronic lower extremity wounds, presenting with worsening BL LE pain and redness.  Assessment and Plan:  Cellulitis of the lower extremities - Mild erythema and noted chronic appearing wound.  On empiric broad-spectrum coverage with cefepime and vancomycin .  Patient follows with wound care in the outpatient setting.  Will continue antibiotic coverage for now.  Dressing changes.  Anticipate transition to p.o. antibiotics, discharge in AM.  Acute exacerbation of chronic HFpEF - BL LE pitting edema on presentation.  Responding well to initial IV diuresis, -1.3 L since yesterday.  Will increase IV Lasix  to 40 mg twice daily (from daily).  Continue to monitor urine output, recheck kidney function in AM.  Beta-blocker, spironolactone  on board.  Supratherapeutic INR - INR 5.9 again this morning.  No active bleeding appreciated.  Will hold warfarin.  Paroxysmal atrial fibrillation - Metoprolol  on board.  Holding warfarin due to supratherapeutic INR.  CKD 3B - Creatinine around baseline.  Will monitor urine output and recheck BMP in AM.  CAD/HTN/HLD - Warfarin, beta-blocker, statin.  Class III obesity - BMI greater than 47.  Encourage lifestyle modifications.  Goals of care - Showing improvement.  Anticipate transition to p.o. antibiotics and discharge tomorrow.   Subjective: Patient resting comfortably this morning.  Feeling improved.  Denies any fever, chills, shortness of breath, chest pain, nausea, vomiting, abdominal pain.  Responded well to diuresis.  Physical Exam:  Vitals:   07/02/24 2123 07/03/24 0511 07/03/24 0723 07/03/24 1128  BP: 138/63 (!) 151/64  (!) 148/59 (!) 138/54  Pulse: 87 87 86 79  Resp: 19 17    Temp: 99.3 F (37.4 C) 98.4 F (36.9 C) 98.2 F (36.8 C) 98.2 F (36.8 C)  TempSrc: Oral Oral Oral Oral  SpO2: 98% 97% 98% 99%  Weight:        GENERAL:  Alert, pleasant, no acute distress  HEENT:  EOMI CARDIOVASCULAR:  RRR, no murmurs appreciated RESPIRATORY:  Clear to auscultation, no wheezing, rales, or rhonchi GASTROINTESTINAL:  Soft, nontender, nondistended EXTREMITIES: 2+ BL LE pitting edema  NEURO:  No new focal deficits appreciated SKIN: Lower extremity wounds  PSYCH:  Appropriate mood and affect     Data Reviewed:  Imaging Studies: DG Tibia/Fibula Right Result Date: 07/02/2024 CLINICAL DATA:  Leg wound. EXAM: RIGHT TIBIA AND FIBULA - 2 VIEW; PORTABLE CHEST - 1 VIEW COMPARISON:  Chest CT without contrast 08/19/2023, AP Lat chest 04/09/2023, right knee series 10/04/2022, left foot series 04/20/2024. FINDINGS: Chest AP portable 2:09 a.m.: Stable cardiomegaly. CABG changes. Old left upper lobectomy. Central vascular prominence is again seen without overt edema. The lungs are clear. The mediastinum is stable. There is aortic atherosclerosis. Thoracic spondylosis. No new osseous abnormality. AP Lat right tibia fibula two views in 4 films: Vascular calcifications, heaviest in the superficial femoral artery. There is diffuse stranding edema throughout the foreleg, ankle and foot. No soft tissue gas or visible foreign body. No evidence of tibial/fibular fractures or other focal bone lesions. There is advanced tricompartmental arthrosis of the left knee. Mild arthrosis of the ankle. Vascular calcifications continue throughout the foreleg into the foot. IMPRESSION: 1. Stable cardiomegaly and central vascular prominence  without overt edema. 2. No other evidence of acute chest disease. 3. Diffuse stranding edema throughout the right foreleg, ankle and foot. No soft tissue gas or visible foreign body. 4. No evidence of tibial/fibular  fractures or other focal bone lesions. 5. Vascular calcifications. 6. Advanced tricompartmental arthrosis of the left knee. Electronically Signed   By: Francis Quam M.D.   On: 07/02/2024 03:07   DG Chest Portable 1 View Result Date: 07/02/2024 CLINICAL DATA:  Leg wound. EXAM: RIGHT TIBIA AND FIBULA - 2 VIEW; PORTABLE CHEST - 1 VIEW COMPARISON:  Chest CT without contrast 08/19/2023, AP Lat chest 04/09/2023, right knee series 10/04/2022, left foot series 04/20/2024. FINDINGS: Chest AP portable 2:09 a.m.: Stable cardiomegaly. CABG changes. Old left upper lobectomy. Central vascular prominence is again seen without overt edema. The lungs are clear. The mediastinum is stable. There is aortic atherosclerosis. Thoracic spondylosis. No new osseous abnormality. AP Lat right tibia fibula two views in 4 films: Vascular calcifications, heaviest in the superficial femoral artery. There is diffuse stranding edema throughout the foreleg, ankle and foot. No soft tissue gas or visible foreign body. No evidence of tibial/fibular fractures or other focal bone lesions. There is advanced tricompartmental arthrosis of the left knee. Mild arthrosis of the ankle. Vascular calcifications continue throughout the foreleg into the foot. IMPRESSION: 1. Stable cardiomegaly and central vascular prominence without overt edema. 2. No other evidence of acute chest disease. 3. Diffuse stranding edema throughout the right foreleg, ankle and foot. No soft tissue gas or visible foreign body. 4. No evidence of tibial/fibular fractures or other focal bone lesions. 5. Vascular calcifications. 6. Advanced tricompartmental arthrosis of the left knee. Electronically Signed   By: Francis Quam M.D.   On: 07/02/2024 03:07    There are no new results to review at this time.  Previous records (including but not limited to H&P, progress notes, nursing notes, TOC management) were reviewed in assessment of this patient.  Labs: CBC: Recent Labs  Lab  07/02/24 0039  WBC 7.9  NEUTROABS 5.8  HGB 10.9*  HCT 36.8  MCV 87.2  PLT 217   Basic Metabolic Panel: Recent Labs  Lab 07/02/24 0311  NA 137  K 4.0  CL 103  CO2 24  GLUCOSE 87  BUN 15  CREATININE 1.30*  CALCIUM  9.7   Liver Function Tests: Recent Labs  Lab 07/02/24 0311  AST 29  ALT 20  ALKPHOS 160*  BILITOT 1.1  PROT 7.2  ALBUMIN  3.4*   CBG: Recent Labs  Lab 07/02/24 1156 07/02/24 1708 07/02/24 2124 07/03/24 0751 07/03/24 1127  GLUCAP 149* 135* 185* 141* 104*    Scheduled Meds:  furosemide   40 mg Oral Daily   insulin  aspart  0-9 Units Subcutaneous TID WC   insulin  aspart protamine - aspart  40 Units Subcutaneous Q breakfast   lisinopril   10 mg Oral Daily   metoprolol  succinate  50 mg Oral Daily   pantoprazole   40 mg Oral Daily   rosuvastatin   40 mg Oral Daily   spironolactone   25 mg Oral Daily   Warfarin - Pharmacist Dosing Inpatient   Does not apply q1600   Continuous Infusions:  ceFEPime (MAXIPIME) IV 2 g (07/03/24 0543)   [START ON 07/04/2024] vancomycin      PRN Meds:.acetaminophen  **OR** acetaminophen , ondansetron  **OR** ondansetron  (ZOFRAN ) IV, oxyCODONE   Family Communication: None at bedside  Disposition: Status is: Observation The patient remains OBS appropriate and will d/c before 2 midnights.     Time spent: 38 minutes  Length of inpatient stay: 0 days  Author: Carliss LELON Canales, DO 07/03/2024 12:13 PM  For on call review [email or web address].

## 2024-07-03 NOTE — Progress Notes (Signed)
 PHARMACY - ANTICOAGULATION CONSULT NOTE  Pharmacy Consult for warfarin Indication: atrial fibrillation  Allergies  Allergen Reactions   Levofloxacin Itching   Morphine  And Codeine Itching and Other (See Comments)    Pt reports oxycodone  history with no allergy.    Patient Measurements: Weight: 121.6 kg (268 lb 1.3 oz) (OV 06/08/24)  Vital Signs: Temp: 98.2 F (36.8 C) (10/07 1128) Temp Source: Oral (10/07 1128) BP: 138/54 (10/07 1128) Pulse Rate: 79 (10/07 1128)  Labs: Recent Labs    07/02/24 0039 07/02/24 0311 07/03/24 0502  HGB 10.9*  --   --   HCT 36.8  --   --   PLT 217  --   --   LABPROT  --  55.1* 55.2*  INR  --  5.9* 5.9*  CREATININE  --  1.30*  --     Estimated Creatinine Clearance: 48 mL/min (A) (by C-G formula based on SCr of 1.3 mg/dL (H)).   Medical History: Past Medical History:  Diagnosis Date   Arthritis    knees (02/05/2016)   Cancer (HCC) 5/10   LUL Vats    CHF (congestive heart failure) (HCC)    Coronary artery disease    GERD (gastroesophageal reflux disease)    tx meds   H/O hiatal hernia    History of blood transfusion several   last april 2015 s/p knee surgery (02/05/2016)   Hypercholesteremia    Hypertension    Lung cancer (HCC)    2010, surgery 18% left lung no radiation or chemo   Non-STEMI (non-ST elevated myocardial infarction) (HCC) 04/07/2012   See cath results below   Paroxysmal atrial fibrillation (HCC) 04/07/2012   On Warfarin   Peripheral vascular disease 8/12   Lt SFA PTA   Presence of stent in LAD coronary artery 04/07/2012   Xience Expedition DES 2.75 mm x 18 mm (dilated to 3.0 mm)   Sleep apnea    does not wear CPAP   Type II diabetes mellitus (HCC)    insulin  dependent    Medications:  Medications Prior to Admission  Medication Sig Dispense Refill Last Dose/Taking   acetaminophen  (TYLENOL ) 325 MG tablet Take 2 tablets (650 mg total) by mouth every 6 (six) hours as needed for mild pain (or Fever >/= 101). 20  tablet 0 Unknown   Cholecalciferol  (VITAMIN D3) 50 MCG (2000 UT) TABS Take 2,000 Units by mouth daily.   07/01/2024 Morning   furosemide  (LASIX ) 40 MG tablet Take 40 mg daily until weight is 244 pounds. Once weight reaches 244, take 40 mg every other day. 90 tablet 3 Unknown   insulin  NPH-regular Human (70-30) 100 UNIT/ML injection Inject 20 Units into the skin 2 (two) times daily with a meal. (Patient taking differently: Inject 20-50 Units into the skin 2 (two) times daily with a meal. 50 Units in the AM and PRN in the PM) 12 mL 0 07/01/2024 Morning   Lancets (ONETOUCH DELICA PLUS LANCET33G) MISC Apply topically.   Unknown   lisinopril  (ZESTRIL ) 10 MG tablet Take 1 tablet (10 mg total) by mouth daily. 30 tablet 0 07/01/2024 Morning   metoCLOPramide  (REGLAN ) 10 MG tablet Take 10 mg by mouth 4 (four) times daily.   07/01/2024 Morning   metoprolol  succinate (TOPROL -XL) 50 MG 24 hr tablet Take 1 tablet (50 mg total) by mouth daily. Take with or immediately following a meal. 90 tablet 3 07/01/2024 Morning   metoprolol  tartrate (LOPRESSOR ) 25 MG tablet Take 1 table as needed for heart rate above  120bpm. 90 tablet 3 Unknown   ONETOUCH VERIO test strip 1 each 2 (two) times daily.   Unknown   pantoprazole  (PROTONIX ) 40 MG tablet Take 40 mg by mouth daily.   07/01/2024 Morning   rosuvastatin  (CRESTOR ) 40 MG tablet Take 40 mg by mouth daily.   07/01/2024 Morning   spironolactone  (ALDACTONE ) 25 MG tablet Take 1 tablet (25 mg total) by mouth daily. Take 25 mg daily until your weight is 244 pounds. Once your weight reaches 244 pounds, then take 25 mg every other day. 90 tablet 3 Unknown   warfarin (COUMADIN ) 7.5 MG tablet TAKE 1/2 TABLET TO 1 TABLET BY MOUTH DAILY AS DIRECTED BY COUMADIN  CLINIC (Patient taking differently: Take 3.75-7.5 mg by mouth as directed. 3.75 MG : Monday And Wednesday 7.5 MG : Tues, Thurs, Fri, Sat, Sun) 65 tablet 1 07/01/2024 at  7:30 PM   Blood Glucose Monitoring Suppl (ONETOUCH VERIO FLEX  SYSTEM) w/Device KIT    Unknown   diclofenac  Sodium (VOLTAREN ) 1 % GEL Apply 2 g topically 4 (four) times daily. Right knee (Patient not taking: Reported on 07/02/2024) 50 g 0 Not Taking   sucralfate  (CARAFATE ) 1 g tablet Take 1 tablet (1 g total) by mouth 4 (four) times daily -  with meals and at bedtime. (Patient not taking: Reported on 07/02/2024) 21 tablet 0 Not Taking    Assessment: 73 yo F admitted with RLE cellulitis and possible UTI started on antibiotics. She takes warfarin PTA for Afib. Pharmacy consulted to manage warfarin inpatient.   INR remains supratherapeutic at 5.9. No bleeding noted, Hgb 10.9 and this appears to be baseline, platelets are normal. No drug interactions noted.  PTA regimen: 7.5 mg daily except 3.75 mg MW, last dose 10/5  Goal of Therapy:  INR 2-3 Monitor platelets by anticoagulation protocol: Yes   Plan:  Hold warfarin tonight INR daily Monitor for s/sx of bleeding  Thank you for involving pharmacy in this patient's care.  Suzen Lovelace, PharmD, BCPS Clinical Pharmacist Clinical phone for 07/03/2024 is [phone] 07/03/2024 3:26 PM

## 2024-07-03 NOTE — Hospital Course (Signed)
 72F with a Hx of GERD/hiatal hernia, DM 2, HLD, HTN, CAD with LAD stent, P A-fib on warfarin, HFpEF, PVD, OSA not on CPAP, history of lung cancer and LUL VATS, chronic lower extremity wounds, presenting with worsening BL LE pain and redness.   Assessment and Plan:   Cellulitis of the lower extremities - Mild erythema and noted chronic appearing wound.  On empiric broad-spectrum coverage with cefepime and vancomycin .  Patient follows with wound care in the outpatient setting.  Will continue antibiotic coverage for now.  Dressing changes.  Anticipate transition to p.o. antibiotics, discharge in AM.   Acute exacerbation of chronic HFpEF - BL LE pitting edema on presentation.  Responding well to initial IV diuresis, -1.3 L since yesterday.  Will increase IV Lasix  to 40 mg twice daily (from daily).  Continue to monitor urine output, recheck kidney function in AM.  Beta-blocker, spironolactone  on board.   Supratherapeutic INR - INR 5.9 again this morning.  No active bleeding appreciated.  Will hold warfarin.   Paroxysmal atrial fibrillation - Metoprolol  on board.  Holding warfarin due to supratherapeutic INR.   CKD 3B - Creatinine around baseline.  Will monitor urine output and recheck BMP in AM.   CAD/HTN/HLD - Warfarin, beta-blocker, statin.   Class III obesity - BMI greater than 47.  Encourage lifestyle modifications.   Goals of care - Showing improvement.  Anticipate transition to p.o. antibiotics and discharge tomorrow.

## 2024-07-03 NOTE — TOC Progression Note (Addendum)
 Transition of Care [facility]) - Progression Note    Patient Details  Name: [name] MRN: [id number] Date of Birth: 1950-11-25  [facility] CM/SW Contact  Rosaline JONELLE Joe, RN Phone Number: 07/03/2024, 1:54 PM  Clinical Narrative:    CM met with the patient at the bedside after patient was evaluated by PT.  SNF placement was recommended and the patient is agreeable to SNF placement.    Patient states that she has been to [facility] in the past and is agreeable to return if bed offer is made at the facility.  [employer] was called and bariatric 3:1 was placed on hold for delivery since patient needs SNF placement.  SNF work up started today - MSW with IP Care Management will follow up once bed offers are available.                     Expected Discharge Plan and Services                                               Social Drivers of Health (SDOH) Interventions SDOH Screenings   Food Insecurity: No Food Insecurity (07/02/2024)  Housing: Low Risk  (07/02/2024)  Transportation Needs: No Transportation Needs (07/02/2024)  Utilities: Not At Risk (07/02/2024)  Depression (PHQ2-9): Low Risk  (05/07/2020)  Social Connections: Unknown (07/02/2024)  Stress: Stress Concern Present (11/14/2019)  Tobacco Use: Medium Risk (07/02/2024)    Readmission Risk Interventions    04/12/2023    3:40 PM  Readmission Risk Prevention Plan  Transportation Screening Complete  PCP or Specialist Appt within 3-5 Days Complete  HRI or Home Care Consult Complete  Social Work Consult for Recovery Care Planning/Counseling Complete  Palliative Care Screening Not Applicable  Medication Review Oceanographer) Referral to Pharmacy

## 2024-07-03 NOTE — [place] FL2 (Signed)
 [place]  MEDICAID FL2 LEVEL OF CARE FORM     IDENTIFICATION  Patient Name: [name] Birthdate: 1951-06-10 Sex: female Admission Date (Current Location): 07/01/2024  [facility] and [place] Number:  Producer, television/film/video and Address:  [facility], [address], [place], [place] [address]      Provider Number: [id number]  Attending Physician Name and Address:  Arlon Carliss ORN, DO  Relative Name and Phone Number:  Casmira Cramer, son - [phone]    Current Level of Care: Hospital Recommended Level of Care: Skilled Nursing Facility Prior Approval Number:    Date Approved/Denied:   PASRR Number:    Discharge Plan: SNF    Current Diagnoses: Patient Active Problem List   Diagnosis Date Noted   Cellulitis 07/02/2024   Supratherapeutic INR 07/02/2024   Acute renal failure superimposed on stage 3b chronic kidney disease (HCC) 04/10/2023   Prolonged QT interval 04/10/2023   Arthritis of right knee 10/06/2022   Class 3 obesity (HCC) 10/06/2022   Chronic diastolic CHF (congestive heart failure) (HCC) 10/01/2022   Atrial flutter (HCC) 07/14/2021   Chest pain with moderate risk for cardiac etiology 07/14/2021   Hypercholesterolemia 01/11/2018   Type 2 diabetes mellitus with obesity 01/11/2018   Pulmonary nodules/lesions, multiple 11/23/2017   RBBB 10/31/2017   S/P CABG x 1 08/17/2017   Abnormal nuclear stress test 02/05/2016   Chest pain with high risk for cardiac etiology 02/05/2016   CAD S/P percutaneous coronary angioplasty 02/05/2016   GERD (gastroesophageal reflux disease) 01/06/2016   CKD (chronic kidney disease) stage 3, GFR 30-59 ml/min (HCC) 01/06/2016   Chronic diastolic heart failure (HCC) 06/28/2015   Esophageal reflux 02/15/2014   Anemia 01/17/2014   S/P left TK revision 12/31/2013   Knee osteomyelitis, left (HCC) 11/21/2013   Foreign body of knee with infection 11/18/2013   Constipation 11/07/2013   Expected blood loss  anemia 11/06/2013   Left knee spacer, following removal of joint prosthesis 11/05/2013   DM (diabetes mellitus), type 2 with peripheral vascular complications (HCC) 11/05/2013   Onychomycosis 10/23/2013   Pain, foot 10/23/2013   DJD (degenerative joint disease) of knee 04/10/2013   Vaginal atrophy 01/01/2013   OAB (overactive bladder) 01/01/2013   Osteopenia 01/01/2013   Chest pain 11/28/2012   Palpitations 11/22/2012   Type 2 diabetes mellitus with hyperlipidemia (HCC) 11/22/2012   Chronic anticoagulation 11/22/2012   Super obese 07/11/2012   Trimalleolar fracture 07/07/2012   Coronary artery disease involving native coronary artery of native heart without angina pectoris 04/08/2012   ICM, EF 40-45% cath 04/07/12- improved to 50-60% by echo Oct 2013 04/08/2012   Paroxysmal atrial fibrillation (HCC) 04/07/2012   NSTEMI (non-ST elevated myocardial infarction)- 7/13 04/07/2012   Unstable angina (HCC) 04/07/2012   PVD (peripheral vascular disease), Lt SFA PTA Aug 2012 04/07/2012   Cancer of left lung parenchyma (HCC) 01/13/2009   Insulin  dependent diabetes mellitus (HCC) 11/27/2008   Essential hypertension 11/27/2008    Orientation RESPIRATION BLADDER Height & Weight     Self, Time, Situation, Place  Normal External catheter Weight: 121.6 kg (OV 06/08/24) Height:     BEHAVIORAL SYMPTOMS/MOOD NEUROLOGICAL BOWEL NUTRITION STATUS      Continent Diet (See discharge summary)  AMBULATORY STATUS COMMUNICATION OF NEEDS Skin   Total Care Verbally Other (Comment) (wound to right lower extremity)                       Personal Care Assistance Level  of Assistance  Bathing, Feeding, Dressing Bathing Assistance: Maximum assistance Feeding assistance: Independent Dressing Assistance: Maximum assistance     Functional Limitations Info  Sight, Hearing, Speech Sight Info: Adequate Hearing Info: Adequate Speech Info: Adequate    SPECIAL CARE FACTORS FREQUENCY  PT (By licensed PT), OT  (By licensed OT)     PT Frequency: 5 x per week OT Frequency: 5 x per week            Contractures Contractures Info: Not present    Additional Factors Info  Code Status, Allergies Code Status Info: Full Code Allergies Info: Levofloxacin, morphine , codeine           Current Medications (07/03/2024):  This is the current hospital active medication list Current Facility-Administered Medications  Medication Dose Route Frequency Provider Last Rate Last Admin   acetaminophen  (TYLENOL ) tablet 650 mg  650 mg Oral Q6H PRN Celinda Alm Lot, MD   650 mg at 07/03/24 1119   Or   acetaminophen  (TYLENOL ) suppository 650 mg  650 mg Rectal Q6H PRN Celinda Alm Lot, MD       ceFEPIme (MAXIPIME) 2 g in sodium chloride  0.9 % 100 mL IVPB  2 g Intravenous Q12H Bari Delon BIRCH, RPH 200 mL/hr at 07/03/24 0543 2 g at 07/03/24 0543   furosemide  (LASIX ) tablet 40 mg  40 mg Oral BID Arlon Carliss ORN, DO       insulin  aspart (novoLOG ) injection 0-9 Units  0-9 Units Subcutaneous TID WC Celinda Alm Lot, MD   1 Units at 07/03/24 0900   insulin  aspart protamine - aspart (NOVOLOG  MIX 70/30) injection 40 Units  40 Units Subcutaneous Q breakfast Celinda Alm Lot, MD   40 Units at 07/03/24 9090   lisinopril  (ZESTRIL ) tablet 10 mg  10 mg Oral Daily Celinda Alm Lot, MD   10 mg at 07/03/24 0900   metoprolol  succinate (TOPROL -XL) 24 hr tablet 50 mg  50 mg Oral Daily Celinda Alm Lot, MD   50 mg at 07/03/24 0900   ondansetron  (ZOFRAN ) tablet 4 mg  4 mg Oral Q6H PRN Celinda Alm Lot, MD   4 mg at 07/02/24 2338   Or   ondansetron  (ZOFRAN ) injection 4 mg  4 mg Intravenous Q6H PRN Celinda Alm Lot, MD       oxyCODONE  (Oxy IR/ROXICODONE ) immediate release tablet 5 mg  5 mg Oral Q4H PRN Celinda Alm Lot, MD   5 mg at 07/03/24 1119   pantoprazole  (PROTONIX ) EC tablet 40 mg  40 mg Oral Daily Celinda Alm Lot, MD   40 mg at 07/03/24 0900   rosuvastatin  (CRESTOR ) tablet 40 mg  40 mg Oral Daily  Celinda Alm Lot, MD   40 mg at 07/03/24 0900   spironolactone  (ALDACTONE ) tablet 25 mg  25 mg Oral Daily Celinda Alm Lot, MD   25 mg at 07/03/24 0900   [START ON 07/04/2024] vancomycin  (VANCOREADY) IVPB 1750 mg/350 mL  1,750 mg Intravenous Q48H , Delon BIRCH, [facility]       Warfarin - Pharmacist Dosing Inpatient   Does not apply q1600 Bari Delon BIRCH, [facility]         Discharge Medications: Please see discharge summary for a list of discharge medications.  Relevant Imaging Results:  Relevant Lab Results:   Additional Information SS# [id number]  Rosaline JONELLE Joe, RN

## 2024-07-04 ENCOUNTER — Ambulatory Visit

## 2024-07-04 ENCOUNTER — Encounter

## 2024-07-04 DIAGNOSIS — R600 Localized edema: Secondary | ICD-10-CM

## 2024-07-04 DIAGNOSIS — L03119 Cellulitis of unspecified part of limb: Secondary | ICD-10-CM | POA: Diagnosis not present

## 2024-07-04 LAB — CBC
HCT: 30.7 % — ABNORMAL LOW (ref 36.0–46.0)
Hemoglobin: 9.3 g/dL — ABNORMAL LOW (ref 12.0–15.0)
MCH: 26.2 pg (ref 26.0–34.0)
MCHC: 30.3 g/dL (ref 30.0–36.0)
MCV: 86.5 fL (ref 80.0–100.0)
Platelets: 190 K/uL (ref 150–400)
RBC: 3.55 MIL/uL — ABNORMAL LOW (ref 3.87–5.11)
RDW: 15.5 % (ref 11.5–15.5)
WBC: 6.3 K/uL (ref 4.0–10.5)
nRBC: 0 % (ref 0.0–0.2)

## 2024-07-04 LAB — BASIC METABOLIC PANEL WITH GFR
Anion gap: 10 (ref 5–15)
BUN: 16 mg/dL (ref 8–23)
CO2: 27 mmol/L (ref 22–32)
Calcium: 8.8 mg/dL — ABNORMAL LOW (ref 8.9–10.3)
Chloride: 98 mmol/L (ref 98–111)
Creatinine, Ser: 1.64 mg/dL — ABNORMAL HIGH (ref 0.44–1.00)
GFR, Estimated: 33 mL/min — ABNORMAL LOW (ref >60)
Glucose, Bld: 146 mg/dL — ABNORMAL HIGH (ref 70–99)
Potassium: 3.6 mmol/L (ref 3.5–5.1)
Sodium: 135 mmol/L (ref 135–145)

## 2024-07-04 LAB — GLUCOSE, CAPILLARY
Glucose-Capillary: 140 mg/dL — ABNORMAL HIGH (ref 70–99)
Glucose-Capillary: 87 mg/dL (ref 70–99)
Glucose-Capillary: 60 mg/dL — ABNORMAL LOW (ref 70–99)
Glucose-Capillary: 69 mg/dL — ABNORMAL LOW (ref 70–99)
Glucose-Capillary: 97 mg/dL (ref 70–99)
Glucose-Capillary: 155 mg/dL — ABNORMAL HIGH (ref 70–99)

## 2024-07-04 LAB — PROTIME-INR
INR: 4.3 (ref 0.8–1.2)
Prothrombin Time: 43.1 s — ABNORMAL HIGH (ref 11.4–15.2)

## 2024-07-04 MED ORDER — HYDROXYZINE HCL 10 MG PO TABS
10.0000 mg | ORAL_TABLET | Freq: Three times a day (TID) | ORAL | Status: DC | PRN
Start: 2024-07-04 — End: 2024-07-06
  Administered 2024-07-04: 10 mg via ORAL
  Filled 2024-07-04: qty 1

## 2024-07-04 MED ORDER — FUROSEMIDE 40 MG PO TABS
40.0000 mg | ORAL_TABLET | Freq: Every day | ORAL | Status: DC
Start: 2024-07-05 — End: 2024-07-05

## 2024-07-04 MED ORDER — DOXYCYCLINE HYCLATE 100 MG PO TABS
100.0000 mg | ORAL_TABLET | Freq: Two times a day (BID) | ORAL | Status: DC
  Administered 2024-07-04 – 2024-07-06 (×5): 100 mg via ORAL
  Filled 2024-07-04 (×5): qty 1

## 2024-07-04 MED ORDER — CEFADROXIL 500 MG PO CAPS
500.0000 mg | ORAL_CAPSULE | Freq: Two times a day (BID) | ORAL | Status: DC
  Administered 2024-07-04 – 2024-07-06 (×5): 500 mg via ORAL
  Filled 2024-07-04 (×5): qty 1

## 2024-07-04 NOTE — Progress Notes (Signed)
 PHARMACY - ANTICOAGULATION CONSULT NOTE  Pharmacy Consult for warfarin Indication: atrial fibrillation  Allergies  Allergen Reactions   Levofloxacin Itching   Morphine  And Codeine Itching and Other (See Comments)    Pt reports oxycodone  history with no allergy.    Patient Measurements: Weight: 121.6 kg (268 lb 1.3 oz) (OV 06/08/24)  Vital Signs: Temp: 98.5 F (36.9 C) (10/08 0824) Temp Source: Oral (10/08 0824) BP: 128/47 (10/08 1217) Pulse Rate: 79 (10/08 1217)  Labs: Recent Labs    07/02/24 0039 07/02/24 0311 07/03/24 0502 07/04/24 0340  HGB 10.9*  --   --  9.3*  HCT 36.8  --   --  30.7*  PLT 217  --   --  190  LABPROT  --  55.1* 55.2* 43.1*  INR  --  5.9* 5.9* 4.3*  CREATININE  --  1.30*  --  1.64*    Estimated Creatinine Clearance: 38.1 mL/min (A) (by C-G formula based on SCr of 1.64 mg/dL (H)).   Medical History: Past Medical History:  Diagnosis Date   Arthritis    knees (02/05/2016)   Cancer (HCC) 5/10   LUL Vats    CHF (congestive heart failure) (HCC)    Coronary artery disease    GERD (gastroesophageal reflux disease)    tx meds   H/O hiatal hernia    History of blood transfusion several   last april 2015 s/p knee surgery (02/05/2016)   Hypercholesteremia    Hypertension    Lung cancer (HCC)    2010, surgery 18% left lung no radiation or chemo   Non-STEMI (non-ST elevated myocardial infarction) (HCC) 04/07/2012   See cath results below   Paroxysmal atrial fibrillation (HCC) 04/07/2012   On Warfarin   Peripheral vascular disease 8/12   Lt SFA PTA   Presence of stent in LAD coronary artery 04/07/2012   Xience Expedition DES 2.75 mm x 18 mm (dilated to 3.0 mm)   Sleep apnea    does not wear CPAP   Type II diabetes mellitus (HCC)    insulin  dependent    Medications:  Medications Prior to Admission  Medication Sig Dispense Refill Last Dose/Taking   acetaminophen  (TYLENOL ) 325 MG tablet Take 2 tablets (650 mg total) by mouth every 6 (six)  hours as needed for mild pain (or Fever >/= 101). 20 tablet 0 Unknown   Cholecalciferol  (VITAMIN D3) 50 MCG (2000 UT) TABS Take 2,000 Units by mouth daily.   07/01/2024 Morning   furosemide  (LASIX ) 40 MG tablet Take 40 mg daily until weight is 244 pounds. Once weight reaches 244, take 40 mg every other day. 90 tablet 3 Unknown   insulin  NPH-regular Human (70-30) 100 UNIT/ML injection Inject 20 Units into the skin 2 (two) times daily with a meal. (Patient taking differently: Inject 20-50 Units into the skin 2 (two) times daily with a meal. 50 Units in the AM and PRN in the PM) 12 mL 0 07/01/2024 Morning   Lancets (ONETOUCH DELICA PLUS LANCET33G) MISC Apply topically.   Unknown   lisinopril  (ZESTRIL ) 10 MG tablet Take 1 tablet (10 mg total) by mouth daily. 30 tablet 0 07/01/2024 Morning   metoCLOPramide  (REGLAN ) 10 MG tablet Take 10 mg by mouth 4 (four) times daily.   07/01/2024 Morning   metoprolol  succinate (TOPROL -XL) 50 MG 24 hr tablet Take 1 tablet (50 mg total) by mouth daily. Take with or immediately following a meal. 90 tablet 3 07/01/2024 Morning   metoprolol  tartrate (LOPRESSOR ) 25 MG tablet Take  1 table as needed for heart rate above 120bpm. 90 tablet 3 Unknown   ONETOUCH VERIO test strip 1 each 2 (two) times daily.   Unknown   pantoprazole  (PROTONIX ) 40 MG tablet Take 40 mg by mouth daily.   07/01/2024 Morning   rosuvastatin  (CRESTOR ) 40 MG tablet Take 40 mg by mouth daily.   07/01/2024 Morning   spironolactone  (ALDACTONE ) 25 MG tablet Take 1 tablet (25 mg total) by mouth daily. Take 25 mg daily until your weight is 244 pounds. Once your weight reaches 244 pounds, then take 25 mg every other day. 90 tablet 3 Unknown   warfarin (COUMADIN ) 7.5 MG tablet TAKE 1/2 TABLET TO 1 TABLET BY MOUTH DAILY AS DIRECTED BY COUMADIN  CLINIC (Patient taking differently: Take 3.75-7.5 mg by mouth as directed. 3.75 MG : Monday And Wednesday 7.5 MG : Tues, Thurs, Fri, Sat, Sun) 65 tablet 1 07/01/2024 at  7:30 PM   Blood  Glucose Monitoring Suppl (ONETOUCH VERIO FLEX SYSTEM) w/Device KIT    Unknown   diclofenac  Sodium (VOLTAREN ) 1 % GEL Apply 2 g topically 4 (four) times daily. Right knee (Patient not taking: Reported on 07/02/2024) 50 g 0 Not Taking   sucralfate  (CARAFATE ) 1 g tablet Take 1 tablet (1 g total) by mouth 4 (four) times daily -  with meals and at bedtime. (Patient not taking: Reported on 07/02/2024) 21 tablet 0 Not Taking    Assessment: 73 yo F admitted with RLE cellulitis and possible UTI started on antibiotics. She takes warfarin PTA for Afib. Pharmacy consulted to manage warfarin inpatient.   INR remains supratherapeutic at 4.3 but trending down after warfarin doses held. No bleeding noted.  Antibiotics changed to Doxycycline  and Cefadroxil both of which have a moderate DDI with warfarin and could elevate INR.  Will monitor.  PTA regimen: 7.5 mg daily except 3.75 mg MW, last dose 10/5  Goal of Therapy:  INR 2-3 Monitor platelets by anticoagulation protocol: Yes   Plan:  Hold warfarin tonight INR daily Monitor for s/sx of bleeding  Thank you for involving pharmacy in this patient's care.  Suzen Lovelace, PharmD, BCPS Clinical Pharmacist Clinical phone for 07/04/2024 is [phone] 07/04/2024 1:49 PM

## 2024-07-04 NOTE — TOC Progression Note (Addendum)
 Transition of Care [facility]) - Progression Note    Patient Details  Name: [name] MRN: [id number] Date of Birth: 07-19-1951  Transition of [facility]) CM/SW Contact  Sherline Clack, [place] Phone Number: 07/04/2024, 1:01 PM  Clinical Narrative:     Update 3:43 PM: [employer] will call CSW back with insurance decision.  CSW presented facility list with Medicare ratings to patient. Patient indicated [facility] as her choice facility. CSW will accept them in the hub and start insurance authorization with [employer].   Expected Discharge Plan: Skilled Nursing Facility Barriers to Discharge: Continued Medical Work up, English as a second language teacher               Expected Discharge Plan and Services       Living arrangements for the past 2 months: Single Family Home                                       Social Drivers of Health (SDOH) Interventions SDOH Screenings   Food Insecurity: No Food Insecurity (07/02/2024)  Housing: Low Risk  (07/02/2024)  Transportation Needs: No Transportation Needs (07/02/2024)  Utilities: Not At Risk (07/02/2024)  Depression (PHQ2-9): Low Risk  (05/07/2020)  Social Connections: Unknown (07/02/2024)  Stress: Stress Concern Present (11/14/2019)  Tobacco Use: Medium Risk (07/02/2024)    Readmission Risk Interventions    04/12/2023    3:40 PM  Readmission Risk Prevention Plan  Transportation Screening Complete  PCP or Specialist Appt within 3-5 Days Complete  HRI or Home Care Consult Complete  Social Work Consult for Recovery Care Planning/Counseling Complete  Palliative Care Screening Not Applicable  Medication Review Oceanographer) Referral to Pharmacy

## 2024-07-04 NOTE — Progress Notes (Signed)
 Progress Note   Patient: [name] FMW:[id number] DOB: 09-22-1951 DOA: 07/01/2024  DOS: the patient was seen and examined on 07/04/2024   Brief hospital course: 43F with a Hx of GERD/hiatal hernia, DM 2, HLD, HTN, CAD with LAD stent, P A-fib on warfarin, HFpEF, PVD, OSA not on CPAP, history of lung cancer and LUL VATS, chronic lower extremity wounds, presenting with worsening BL LE pain and redness.  Assessment and Plan:  Cellulitis of the lower extremities Patient was found to have erythema involving the lower extremities right greater than left.  Patient was initially placed on broad-spectrum antibiotics.  Erythema is improved.  Swelling is improved.  Will change to doxycycline  and cefadroxil today.  Acute exacerbation of chronic HFpEF Was also noted to have significant swelling of the lower extremities.  Was placed on IV diuretics with good diuresis.  Changed over to oral diuretics twice a day.  Creatinine noted to be higher today.  Will change it back to once a day since his swelling has improved. Patient is otherwise stable.  Noted to be on metoprolol  and spironolactone  as well.  Supratherapeutic INR INR has been elevated.  Warfarin on hold.  Pharmacy is managing.  INR is improved to 4.3 today.  No active bleeding.  Paroxysmal atrial fibrillation Metoprolol  on board.  Holding warfarin due to supratherapeutic INR.  CKD 3B Slight worsening creatinine noted today.  Will recheck labs in the morning.  Monitor urine output.  Avoid nephrotoxic agents.  CAD/HTN/HLD Warfarin, beta-blocker, statin.  Class III obesity Estimated body mass index is 47.49 kg/m as calculated from the following:   Height as of 06/08/24: 5' 3 (1.6 m).   Weight as of this encounter: 121.6 kg.   DVT prophylaxis: On warfarin CODE STATUS: Full code Family communication: Discussed with patient Disposition: Anticipate discharge tomorrow if labs stable.   Subjective: Patient feels well.  Legs have  improved.  Denies any pain issues.  No shortness of breath.  Looking forward to going home soon.  Physical Exam:  Vitals:   07/03/24 1627 07/03/24 2025 07/04/24 0448 07/04/24 0824  BP: (!) 134/47 (!) 126/59 (!) 109/49 (!) 122/50  Pulse: 78 80 79 79  Resp:   17   Temp: 98.2 F (36.8 C) 97.8 F (36.6 C) 97.8 F (36.6 C) 98.5 F (36.9 C)  TempSrc: Oral Oral  Oral  SpO2: 100%  91% 98%  Weight:        General appearance: Awake alert.  In no distress Resp: Clear to auscultation bilaterally.  Normal effort Cardio: S1-S2 is normal regular.  No S3-S4.  No rubs murmurs or bruit GI: Abdomen is soft.  Nontender nondistended.  Bowel sounds are present normal.  No masses organomegaly Extremities: Improved erythema and swelling of the lower extremities.  No active drainage noted. No obvious focal neurological deficits.   Data Reviewed:  Imaging Studies: DG Tibia/Fibula Right Result Date: 07/02/2024 CLINICAL DATA:  Leg wound. EXAM: RIGHT TIBIA AND FIBULA - 2 VIEW; PORTABLE CHEST - 1 VIEW COMPARISON:  Chest CT without contrast 08/19/2023, AP Lat chest 04/09/2023, right knee series 10/04/2022, left foot series 04/20/2024. FINDINGS: Chest AP portable 2:09 a.m.: Stable cardiomegaly. CABG changes. Old left upper lobectomy. Central vascular prominence is again seen without overt edema. The lungs are clear. The mediastinum is stable. There is aortic atherosclerosis. Thoracic spondylosis. No new osseous abnormality. AP Lat right tibia fibula two views in 4 films: Vascular calcifications, heaviest in the superficial femoral artery. There is diffuse stranding edema throughout  the foreleg, ankle and foot. No soft tissue gas or visible foreign body. No evidence of tibial/fibular fractures or other focal bone lesions. There is advanced tricompartmental arthrosis of the left knee. Mild arthrosis of the ankle. Vascular calcifications continue throughout the foreleg into the foot. IMPRESSION: 1. Stable cardiomegaly  and central vascular prominence without overt edema. 2. No other evidence of acute chest disease. 3. Diffuse stranding edema throughout the right foreleg, ankle and foot. No soft tissue gas or visible foreign body. 4. No evidence of tibial/fibular fractures or other focal bone lesions. 5. Vascular calcifications. 6. Advanced tricompartmental arthrosis of the left knee. Electronically Signed   By: Francis Quam M.D.   On: 07/02/2024 03:07   DG Chest Portable 1 View Result Date: 07/02/2024 CLINICAL DATA:  Leg wound. EXAM: RIGHT TIBIA AND FIBULA - 2 VIEW; PORTABLE CHEST - 1 VIEW COMPARISON:  Chest CT without contrast 08/19/2023, AP Lat chest 04/09/2023, right knee series 10/04/2022, left foot series 04/20/2024. FINDINGS: Chest AP portable 2:09 a.m.: Stable cardiomegaly. CABG changes. Old left upper lobectomy. Central vascular prominence is again seen without overt edema. The lungs are clear. The mediastinum is stable. There is aortic atherosclerosis. Thoracic spondylosis. No new osseous abnormality. AP Lat right tibia fibula two views in 4 films: Vascular calcifications, heaviest in the superficial femoral artery. There is diffuse stranding edema throughout the foreleg, ankle and foot. No soft tissue gas or visible foreign body. No evidence of tibial/fibular fractures or other focal bone lesions. There is advanced tricompartmental arthrosis of the left knee. Mild arthrosis of the ankle. Vascular calcifications continue throughout the foreleg into the foot. IMPRESSION: 1. Stable cardiomegaly and central vascular prominence without overt edema. 2. No other evidence of acute chest disease. 3. Diffuse stranding edema throughout the right foreleg, ankle and foot. No soft tissue gas or visible foreign body. 4. No evidence of tibial/fibular fractures or other focal bone lesions. 5. Vascular calcifications. 6. Advanced tricompartmental arthrosis of the left knee. Electronically Signed   By: Francis Quam M.D.   On:  07/02/2024 03:07   Labs: CBC: Recent Labs  Lab 07/02/24 0039 07/04/24 0340  WBC 7.9 6.3  NEUTROABS 5.8  --   HGB 10.9* 9.3*  HCT 36.8 30.7*  MCV 87.2 86.5  PLT 217 190   Basic Metabolic Panel: Recent Labs  Lab 07/02/24 0311 07/04/24 0340  NA 137 135  K 4.0 3.6  CL 103 98  CO2 24 27  GLUCOSE 87 146*  BUN 15 16  CREATININE 1.30* 1.64*  CALCIUM  9.7 8.8*   Liver Function Tests: Recent Labs  Lab 07/02/24 0311  AST 29  ALT 20  ALKPHOS 160*  BILITOT 1.1  PROT 7.2  ALBUMIN  3.4*   CBG: Recent Labs  Lab 07/03/24 0751 07/03/24 1127 07/03/24 1625 07/03/24 2056 07/04/24 0821  GLUCAP 141* 104* 139* 144* 140*    Scheduled Meds:  cefadroxil  500 mg Oral BID   doxycycline   100 mg Oral Q12H   [START ON 07/05/2024] furosemide   40 mg Oral Daily   insulin  aspart  0-9 Units Subcutaneous TID WC   insulin  aspart protamine - aspart  40 Units Subcutaneous Q breakfast   metoprolol  succinate  50 mg Oral Daily   pantoprazole   40 mg Oral Daily   rosuvastatin   40 mg Oral Daily   spironolactone   25 mg Oral Daily   Warfarin - Pharmacist Dosing Inpatient   Does not apply q1600   Continuous Infusions:   PRN Meds:.acetaminophen  **OR** acetaminophen , ondansetron  **  OR** ondansetron  (ZOFRAN ) IV, oxyCODONE   Joette Pebbles, MD 07/04/2024 9:46 AM  For on call review [email or web address].

## 2024-07-04 NOTE — Plan of Care (Signed)

## 2024-07-04 NOTE — Plan of Care (Signed)

## 2024-07-04 NOTE — Progress Notes (Signed)
 Physical Therapy Treatment Patient Details Name: [name] MRN: [id number] DOB: 23-Oct-1950 Today's Date: 07/04/2024   History of Present Illness 26F with a Hx of GERD/hiatal hernia, DM 2, HLD, HTN, CAD with LAD stent, P A-fib on warfarin, HFpEF, PVD, OSA not on CPAP, history of lung cancer and LUL VATS, chronic lower extremity wounds, presenting with worsening BL LE pain and redness.    PT Comments  Patient with limited progress.  Knees a little more stiff today and needing A for bed mobility and balance.  She participated well though needs to have meds prior to PT and was distracted by severe itching.  RN aware.  PT will continue to follow.     If plan is discharge home, recommend the following: Two people to help with walking and/or transfers;Two people to help with bathing/dressing/bathroom   Can travel by private vehicle     No  Equipment Recommendations  BSC/3in1 (bariatric)    Recommendations for Other Services       Precautions / Restrictions Precautions Precautions: Fall Recall of Precautions/Restrictions: Intact Restrictions Other Position/Activity Restrictions: Prefers to wear shoes with transfers     Mobility  Bed Mobility Overal bed mobility: Needs Assistance Bed Mobility: Supine to Sit, Sit to Supine     Supine to sit: HOB elevated, Used rails, Max assist, +2 for safety/equipment Sit to supine: Max assist, +2 for physical assistance   General bed mobility comments: initiating lifting trunk and moving legs though unable to scoot hips and then needing A to lift trunk upright, to supine A for legs and trunk    Transfers Overall transfer level: Needs assistance Equipment used: Rolling walker (2 wheels) Transfers: Sit to/from Stand Sit to Stand: Max assist, +2 physical assistance, From elevated surface           General transfer comment: performed x 3 though unable to maintain due to pain in knee (R>L) and instability    Ambulation/Gait                    Stairs             Wheelchair Mobility     Tilt Bed    Modified Rankin (Stroke Patients Only)       Balance Overall balance assessment: Needs assistance   Sitting balance-Leahy Scale: Fair Sitting balance - Comments: on EOB reaching around to scratch and applied vaseline and RN aware     Standing balance-Leahy Scale: Poor Standing balance comment: heavy UE support and 2 A for standing, up to maybe 5 seconds today                            Communication Communication Communication: No apparent difficulties  Cognition Arousal: Alert Behavior During Therapy: WFL for tasks assessed/performed   PT - Cognitive impairments: No apparent impairments                         Following commands: Intact      Cueing Cueing Techniques: Verbal cues  Exercises General Exercises - Lower Extremity Ankle Circles/Pumps: AROM, Both, 10 reps, Supine Heel Slides: AAROM, Both, 5 reps, Supine    General Comments        Pertinent Vitals/Pain Pain Assessment Pain Assessment: Faces Faces Pain Scale: Hurts whole lot Pain Location: R knee Pain Descriptors / Indicators: Aching, Discomfort Pain Intervention(s): Monitored during session, Limited activity within patient's tolerance, Patient requesting pain  meds-RN notified    Home Living                          Prior Function            PT Goals (current goals can now be found in the care plan section) Progress towards PT goals: Progressing toward goals    Frequency    Min 2X/week      PT Plan      Co-evaluation              AM-PAC PT 6 Clicks Mobility   Outcome Measure  Help needed turning from your back to your side while in a flat bed without using bedrails?: A Lot Help needed moving from lying on your back to sitting on the side of a flat bed without using bedrails?: Total Help needed moving to and from a bed to a chair (including a wheelchair)?:  Total Help needed standing up from a chair using your arms (e.g., wheelchair or bedside chair)?: Total Help needed to walk in hospital room?: Total Help needed climbing 3-5 steps with a railing? : Total 6 Click Score: 7    End of Session Equipment Utilized During Treatment: Gait belt Activity Tolerance: Patient limited by pain Patient left: in bed;with call bell/phone within reach;with bed alarm set   PT Visit Diagnosis: Other abnormalities of gait and mobility (R26.89);Muscle weakness (generalized) (M62.81);Difficulty in walking, not elsewhere classified (R26.2);Pain Pain - Right/Left: Right Pain - part of body: Knee     Time: 8594-8566 PT Time Calculation (min) (ACUTE ONLY): 28 min  Charges:    $Therapeutic Activity: 23-37 mins PT General Charges $$ ACUTE PT VISIT: 1 Visit                     [name], PT Acute Rehabilitation Services Office:[phone] 07/04/2024     [name] 07/04/2024, 4:08 PM

## 2024-07-05 DIAGNOSIS — L03119 Cellulitis of unspecified part of limb: Secondary | ICD-10-CM | POA: Diagnosis not present

## 2024-07-05 DIAGNOSIS — N179 Acute kidney failure, unspecified: Secondary | ICD-10-CM

## 2024-07-05 LAB — BASIC METABOLIC PANEL WITH GFR
Anion gap: 11 (ref 5–15)
BUN: 17 mg/dL (ref 8–23)
CO2: 26 mmol/L (ref 22–32)
Calcium: 9.1 mg/dL (ref 8.9–10.3)
Chloride: 98 mmol/L (ref 98–111)
Creatinine, Ser: 1.87 mg/dL — ABNORMAL HIGH (ref 0.44–1.00)
GFR, Estimated: 28 mL/min — ABNORMAL LOW (ref >60)
Glucose, Bld: 130 mg/dL — ABNORMAL HIGH (ref 70–99)
Potassium: 4.2 mmol/L (ref 3.5–5.1)
Sodium: 135 mmol/L (ref 135–145)

## 2024-07-05 LAB — GLUCOSE, CAPILLARY
Glucose-Capillary: 129 mg/dL — ABNORMAL HIGH (ref 70–99)
Glucose-Capillary: 113 mg/dL — ABNORMAL HIGH (ref 70–99)
Glucose-Capillary: 86 mg/dL (ref 70–99)
Glucose-Capillary: 115 mg/dL — ABNORMAL HIGH (ref 70–99)

## 2024-07-05 LAB — PROTIME-INR
INR: 3.3 — ABNORMAL HIGH (ref 0.8–1.2)
Prothrombin Time: 34.7 s — ABNORMAL HIGH (ref 11.4–15.2)

## 2024-07-05 MED ORDER — SODIUM CHLORIDE 0.45 % IV SOLN: INTRAVENOUS | Status: AC

## 2024-07-05 MED ORDER — WARFARIN SODIUM 3 MG PO TABS
3.0000 mg | ORAL_TABLET | Freq: Once | ORAL | Status: AC
  Administered 2024-07-05: 3 mg via ORAL
  Filled 2024-07-05: qty 1

## 2024-07-05 MED ORDER — INSULIN ASPART PROT & ASPART (70-30 MIX) 100 UNIT/ML ~~LOC~~ SUSP
32.0000 [IU] | Freq: Every day | SUBCUTANEOUS | Status: DC
  Administered 2024-07-06: 32 [IU] via SUBCUTANEOUS

## 2024-07-05 NOTE — Progress Notes (Signed)
 Occupational Therapy Treatment Patient Details Name: [name] MRN: [id number] DOB: 1951/06/19 Today's Date: 07/05/2024   History of present illness 26F with a Hx of GERD/hiatal hernia, DM 2, HLD, HTN, CAD with LAD stent, P A-fib on warfarin, HFpEF, PVD, OSA not on CPAP, history of lung cancer and LUL VATS, chronic lower extremity wounds, presenting with worsening BL LE pain and redness.   OT comments  Patient agreeable to OT treatment session this date.  Patient completed a partial supine to sit EOB although unable to complete fully 2/2 patient c/o LE pain and requires max A x1 for sit to supine.  Patient completed UB grooming task of washing face and oral hygiene with Supervision s/p setup.  Patient seems to be limited by LE pain at this time.  Patient would benefit from additional OT intervention to address functional deficits in order for patient to return to PLOF.      If plan is discharge home, recommend the following:  Assist for transportation;Assistance with cooking/housework;Two people to help with walking and/or transfers;A lot of help with bathing/dressing/bathroom   Equipment Recommendations  None recommended by OT    Recommendations for Other Services      Precautions / Restrictions Precautions Precautions: Fall Recall of Precautions/Restrictions: Intact       Mobility Bed Mobility Overal bed mobility: Needs Assistance Bed Mobility: Supine to Sit, Sit to Supine     Supine to sit: HOB elevated, Used rails, Max assist, +2 for safety/equipment Sit to supine: Max assist, Used rails, HOB elevated (attempted supine to sit but patient only able to get to partial sitttng EOB before requesting to get back in bed bc of b/l LE pain)   General bed mobility comments:  (verbal cues for reaching for L bed rail to A trunk to seated position)    Transfers                         Balance Overall balance assessment: Needs assistance Sitting-balance support: Feet  supported                                       ADL either performed or assessed with clinical judgement   ADL Overall ADL's : Needs assistance/impaired     Grooming: Wash/dry hands;Wash/dry face;Oral care;Set up;Sitting;Bed level                                      Extremity/Trunk Assessment              Vision       Perception     Praxis     Communication Communication Communication: No apparent difficulties   Cognition Arousal: Alert Behavior During Therapy: WFL for tasks assessed/performed                                 Following commands: Intact        Cueing   Cueing Techniques: Verbal cues  Exercises      Shoulder Instructions       General Comments      Pertinent Vitals/ Pain       Pain Assessment Pain Assessment: 0-10 Pain Score: 5  Pain Location:  (b/l LEs) Pain Descriptors / Indicators:  Aching, Discomfort Pain Intervention(s): Monitored during session  Home Living                                          Prior Functioning/Environment              Frequency  Min 2X/week        Progress Toward Goals  OT Goals(current goals can now be found in the care plan section)  Progress towards OT goals: Progressing toward goals  Acute Rehab OT Goals OT Goal Formulation: With patient Time For Goal Achievement: 07/17/24 Potential to Achieve Goals: Good ADL Goals Pt Will Perform Grooming: with set-up;sitting Pt Will Perform Upper Body Bathing: with set-up;sitting Pt Will Perform Upper Body Dressing: with set-up;sitting Pt Will Transfer to Toilet: with mod assist;stand pivot transfer;bedside commode  Plan      Co-evaluation                 AM-PAC OT 6 Clicks Daily Activity     Outcome Measure   Help from another person eating meals?: None Help from another person taking care of personal grooming?: A Little Help from another person toileting, which  includes using toliet, bedpan, or urinal?: Total Help from another person bathing (including washing, rinsing, drying)?: A Lot Help from another person to put on and taking off regular upper body clothing?: A Lot Help from another person to put on and taking off regular lower body clothing?: Total 6 Click Score: 13    End of Session    OT Visit Diagnosis: Unsteadiness on feet (R26.81);Muscle weakness (generalized) (M62.81);Pain   Activity Tolerance Patient limited by pain   Patient Left in bed;with call bell/phone within reach;with nursing/sitter in room   Nurse Communication          Time: 8599-8576 OT Time Calculation (min): 23 min  Charges: OT General Charges $OT Visit: 1 Visit OT Treatments $Self Care/Home Management : 23-37 mins  Lamarr Pouch OT/L  Lamarr JONETTA Pouch 07/05/2024, 5:00 PM

## 2024-07-05 NOTE — Progress Notes (Signed)
 PHARMACY - ANTICOAGULATION CONSULT NOTE  Pharmacy Consult for warfarin Indication: atrial fibrillation  Allergies  Allergen Reactions   Levofloxacin Itching   Morphine  And Codeine Itching and Other (See Comments)    Pt reports oxycodone  history with no allergy.    Patient Measurements: Weight: 121.6 kg (268 lb 1.3 oz) (OV 06/08/24)  Vital Signs: Temp: 98.1 F (36.7 C) (10/09 0749) Temp Source: Oral (10/09 0636) BP: 120/64 (10/09 0749) Pulse Rate: 93 (10/09 0749)  Labs: Recent Labs    07/03/24 0502 07/04/24 0340 07/05/24 0319  HGB  --  9.3*  --   HCT  --  30.7*  --   PLT  --  190  --   LABPROT 55.2* 43.1* 34.7*  INR 5.9* 4.3* 3.3*  CREATININE  --  1.64* 1.87*    Estimated Creatinine Clearance: 33.4 mL/min (A) (by C-G formula based on SCr of 1.87 mg/dL (H)).   Medical History: Past Medical History:  Diagnosis Date   Arthritis    knees (02/05/2016)   Cancer (HCC) 5/10   LUL Vats    CHF (congestive heart failure) (HCC)    Coronary artery disease    GERD (gastroesophageal reflux disease)    tx meds   H/O hiatal hernia    History of blood transfusion several   last april 2015 s/p knee surgery (02/05/2016)   Hypercholesteremia    Hypertension    Lung cancer (HCC)    2010, surgery 18% left lung no radiation or chemo   Non-STEMI (non-ST elevated myocardial infarction) (HCC) 04/07/2012   See cath results below   Paroxysmal atrial fibrillation (HCC) 04/07/2012   On Warfarin   Peripheral vascular disease 8/12   Lt SFA PTA   Presence of stent in LAD coronary artery 04/07/2012   Xience Expedition DES 2.75 mm x 18 mm (dilated to 3.0 mm)   Sleep apnea    does not wear CPAP   Type II diabetes mellitus (HCC)    insulin  dependent    Medications:  Medications Prior to Admission  Medication Sig Dispense Refill Last Dose/Taking   acetaminophen  (TYLENOL ) 325 MG tablet Take 2 tablets (650 mg total) by mouth every 6 (six) hours as needed for mild pain (or Fever >/=  101). 20 tablet 0 Unknown   Cholecalciferol  (VITAMIN D3) 50 MCG (2000 UT) TABS Take 2,000 Units by mouth daily.   07/01/2024 Morning   furosemide  (LASIX ) 40 MG tablet Take 40 mg daily until weight is 244 pounds. Once weight reaches 244, take 40 mg every other day. 90 tablet 3 Unknown   insulin  NPH-regular Human (70-30) 100 UNIT/ML injection Inject 20 Units into the skin 2 (two) times daily with a meal. (Patient taking differently: Inject 20-50 Units into the skin 2 (two) times daily with a meal. 50 Units in the AM and PRN in the PM) 12 mL 0 07/01/2024 Morning   Lancets (ONETOUCH DELICA PLUS LANCET33G) MISC Apply topically.   Unknown   lisinopril  (ZESTRIL ) 10 MG tablet Take 1 tablet (10 mg total) by mouth daily. 30 tablet 0 07/01/2024 Morning   metoCLOPramide  (REGLAN ) 10 MG tablet Take 10 mg by mouth 4 (four) times daily.   07/01/2024 Morning   metoprolol  succinate (TOPROL -XL) 50 MG 24 hr tablet Take 1 tablet (50 mg total) by mouth daily. Take with or immediately following a meal. 90 tablet 3 07/01/2024 Morning   metoprolol  tartrate (LOPRESSOR ) 25 MG tablet Take 1 table as needed for heart rate above 120bpm. 90 tablet 3 Unknown  ONETOUCH VERIO test strip 1 each 2 (two) times daily.   Unknown   pantoprazole  (PROTONIX ) 40 MG tablet Take 40 mg by mouth daily.   07/01/2024 Morning   rosuvastatin  (CRESTOR ) 40 MG tablet Take 40 mg by mouth daily.   07/01/2024 Morning   spironolactone  (ALDACTONE ) 25 MG tablet Take 1 tablet (25 mg total) by mouth daily. Take 25 mg daily until your weight is 244 pounds. Once your weight reaches 244 pounds, then take 25 mg every other day. 90 tablet 3 Unknown   warfarin (COUMADIN ) 7.5 MG tablet TAKE 1/2 TABLET TO 1 TABLET BY MOUTH DAILY AS DIRECTED BY COUMADIN  CLINIC (Patient taking differently: Take 3.75-7.5 mg by mouth as directed. 3.75 MG : Monday And Wednesday 7.5 MG : Tues, Thurs, Fri, Sat, Sun) 65 tablet 1 07/01/2024 at  7:30 PM   Blood Glucose Monitoring Suppl (ONETOUCH VERIO  FLEX SYSTEM) w/Device KIT    Unknown    Assessment: 73 yo F admitted with RLE cellulitis and possible UTI started on antibiotics. She takes warfarin PTA for Afib. Pharmacy consulted to manage warfarin inpatient.   Antibiotics changed to Doxycycline  and Cefadroxil both of which have a moderate DDI with warfarin and could elevate INR.  Will monitor.  INR down to 3.3. Will restart warfarin to prevent drop to subtherapeutic level.  PTA regimen: 7.5 mg daily except 3.75 mg MW, last dose 10/5  Goal of Therapy:  INR 2-3 Monitor platelets by anticoagulation protocol: Yes   Plan:  Warfarin 3mg  x1  Monitor daily INR, signs/symptoms of bleeding    Jinnie Door, PharmD, BCPS, BCCP Clinical Pharmacist  Please check AMION for all [facility] Pharmacy phone numbers After 10:00 PM, call Main Pharmacy [phone]

## 2024-07-05 NOTE — Plan of Care (Signed)

## 2024-07-05 NOTE — Inpatient Diabetes Management (Signed)
 Inpatient Diabetes Program Recommendations  AACE/ADA: New Consensus Statement on Inpatient Glycemic Control (2015)  Target Ranges:  Prepandial:   less than 140 mg/dL      Peak postprandial:   less than 180 mg/dL (1-2 hours)      Critically ill patients:  140 - 180 mg/dL   Lab Results  Component Value Date   GLUCAP 129 (H) 07/05/2024   HGBA1C 8.5 (H) 07/02/2024    Review of Glycemic Control  Latest Reference Range & Units 07/04/24 08:21 07/04/24 12:14 07/04/24 17:38 07/04/24 18:14 07/04/24 18:36 07/04/24 22:16 07/05/24 07:48  Glucose-Capillary 70 - 99 mg/dL 859 (H) 87 60 (L) 69 (L) 97 155 (H) 129 (H)  (H): Data is abnormally high (L): Data is abnormally low Diabetes history: Type 2 DM Outpatient Diabetes medications: Novolog  70/30 20-50 units BID Current orders for Inpatient glycemic control: Novolog  70/30 40 units QAM, Novolog  0-9 units TID  Inpatient Diabetes Program Recommendations:    Noted hypoglycemia following 70/30. Consider reducing Novolog  70/30 to 32 units every day.   Thanks, Tinnie Minus, MSN, RNC-OB Diabetes Coordinator [phone] (8a-5p)

## 2024-07-05 NOTE — Consult Note (Signed)
 WOC Nurse Consult Note:  WOC consult performed remotely utilizing imaging and chart review. Reason for Consult:oozing RLE wounds  Wound type: Full thickness RLE wound in setting of PVD Pressure Injury POA: NA Measurement: see nursing flow sheets Wound bed: 100% red, moist Drainage (amount, consistency, odor) see nursing flow sheets Periwound: intact, edematous Dressing procedure/placement/frequency:  Cleanse with NS, pat dry.  Place Calcium  alginate (lawson [phone]) over wound bed, cover with dry 4x4 gauze and wrap with Kerlix or cover with silicone foam dressing which ever is preferred. Change daily.   WOC team will not follow patient at this time. Please re consult if new needs arise.   Thank you,  Doyal Polite, MSN, RN, [facility] WOC Team [phone] (Available Mon-Fri 0700-1500)

## 2024-07-05 NOTE — Progress Notes (Signed)
 Progress Note   Patient: [name] FMW:[id number] DOB: [birth year] DOA: 07/01/2024  DOS: the patient was seen and examined on 07/05/2024   Brief hospital course: 16F with a Hx of GERD/hiatal hernia, DM 2, HLD, HTN, CAD with LAD stent, P A-fib on warfarin, HFpEF, PVD, OSA not on CPAP, history of lung cancer and LUL VATS, chronic lower extremity wounds, presenting with worsening BL LE pain and redness.  Assessment and Plan:  Cellulitis of the lower extremities Patient was found to have erythema involving the lower extremities right greater than left.  Patient was initially placed on broad-spectrum antibiotics.  Erythema is improved.  Swelling is improved.  Patient was changed over to doxycycline  and cefadroxil.    Acute exacerbation of chronic HFpEF Was also noted to have significant swelling of the lower extremities.  Was placed on IV diuretics with good diuresis.  Changed over to oral diuretics twice a day.  Patient had increase in creatinine.  Furosemide  was changed to once a day.  Creatinine again higher today.  Will discontinue furosemide  and spironolactone .  Will gently hydrate and recheck labs in the morning.    AKI on CKD 3B Creatinine continues to rise.  Will hold her diuretics for today.  Gently hydrate and recheck labs in the morning.  Monitor urine output.   Avoid nephrotoxic agents.  Supratherapeutic INR INR is improving.  Noted to be 3.3.  Pharmacy is managing.  Paroxysmal atrial fibrillation Daily metoprolol .  On warfarin for anticoagulation.  Pharmacy is managing.  CAD/HTN/HLD Warfarin, beta-blocker, statin.  Class III obesity Estimated body mass index is 47.49 kg/m as calculated from the following:   Height as of 06/08/24: 5' 3 (1.6 m).   Weight as of this encounter: 121.6 kg.   DVT prophylaxis: On warfarin CODE STATUS: Full code Family communication: Discussed with patient Disposition: SNF is recommended for short-term rehab.   Subjective: Patient feels  well.  Worried about her rising creatinine.  No other complaints offered.  Physical Exam:  Vitals:   07/04/24 1700 07/04/24 2026 07/05/24 0636 07/05/24 0749  BP: (!) 112/43 (!) 112/47 (!) 113/39 120/64  Pulse: 78 78 82 93  Resp:  18 18   Temp: 98.6 F (37 C) 98.5 F (36.9 C) 98.5 F (36.9 C) 98.1 F (36.7 C)  TempSrc: Oral Oral Oral   SpO2: 96% 100% 98% 99%  Weight:        General appearance: Awake alert.  In no distress Resp: Clear to auscultation bilaterally.  Normal effort Cardio: S1-S2 is normal regular.  No S3-S4.  No rubs murmurs or bruit GI: Abdomen is soft.  Nontender nondistended.  Bowel sounds are present normal.  No masses organomegaly Extremities: Improved erythema and swelling of the lower extremities especially the right lower extremity Neurologic: Alert and oriented x3.  No focal neurological deficits.    Data Reviewed:  Imaging Studies: DG Tibia/Fibula Right Result Date: 07/02/2024 CLINICAL DATA:  Leg wound. EXAM: RIGHT TIBIA AND FIBULA - 2 VIEW; PORTABLE CHEST - 1 VIEW COMPARISON:  Chest CT without contrast 08/19/2023, AP Lat chest 04/09/2023, right knee series 10/04/2022, left foot series 04/20/2024. FINDINGS: Chest AP portable 2:09 a.m.: Stable cardiomegaly. CABG changes. Old left upper lobectomy. Central vascular prominence is again seen without overt edema. The lungs are clear. The mediastinum is stable. There is aortic atherosclerosis. Thoracic spondylosis. No new osseous abnormality. AP Lat right tibia fibula two views in 4 films: Vascular calcifications, heaviest in the superficial femoral artery. There is diffuse stranding edema  throughout the foreleg, ankle and foot. No soft tissue gas or visible foreign body. No evidence of tibial/fibular fractures or other focal bone lesions. There is advanced tricompartmental arthrosis of the left knee. Mild arthrosis of the ankle. Vascular calcifications continue throughout the foreleg into the foot. IMPRESSION: 1. Stable  cardiomegaly and central vascular prominence without overt edema. 2. No other evidence of acute chest disease. 3. Diffuse stranding edema throughout the right foreleg, ankle and foot. No soft tissue gas or visible foreign body. 4. No evidence of tibial/fibular fractures or other focal bone lesions. 5. Vascular calcifications. 6. Advanced tricompartmental arthrosis of the left knee. Electronically Signed   By: Francis Quam M.D.   On: 07/02/2024 03:07   DG Chest Portable 1 View Result Date: 07/02/2024 CLINICAL DATA:  Leg wound. EXAM: RIGHT TIBIA AND FIBULA - 2 VIEW; PORTABLE CHEST - 1 VIEW COMPARISON:  Chest CT without contrast 08/19/2023, AP Lat chest 04/09/2023, right knee series 10/04/2022, left foot series 04/20/2024. FINDINGS: Chest AP portable 2:09 a.m.: Stable cardiomegaly. CABG changes. Old left upper lobectomy. Central vascular prominence is again seen without overt edema. The lungs are clear. The mediastinum is stable. There is aortic atherosclerosis. Thoracic spondylosis. No new osseous abnormality. AP Lat right tibia fibula two views in 4 films: Vascular calcifications, heaviest in the superficial femoral artery. There is diffuse stranding edema throughout the foreleg, ankle and foot. No soft tissue gas or visible foreign body. No evidence of tibial/fibular fractures or other focal bone lesions. There is advanced tricompartmental arthrosis of the left knee. Mild arthrosis of the ankle. Vascular calcifications continue throughout the foreleg into the foot. IMPRESSION: 1. Stable cardiomegaly and central vascular prominence without overt edema. 2. No other evidence of acute chest disease. 3. Diffuse stranding edema throughout the right foreleg, ankle and foot. No soft tissue gas or visible foreign body. 4. No evidence of tibial/fibular fractures or other focal bone lesions. 5. Vascular calcifications. 6. Advanced tricompartmental arthrosis of the left knee. Electronically Signed   By: Francis Quam M.D.    On: 07/02/2024 03:07   Labs: CBC: Recent Labs  Lab 07/02/24 0039 07/04/24 0340  WBC 7.9 6.3  NEUTROABS 5.8  --   HGB 10.9* 9.3*  HCT 36.8 30.7*  MCV 87.2 86.5  PLT 217 190   Basic Metabolic Panel: Recent Labs  Lab 07/02/24 0311 07/04/24 0340 07/05/24 0319  NA 137 135 135  K 4.0 3.6 4.2  CL 103 98 98  CO2 24 27 26   GLUCOSE 87 146* 130*  BUN 15 16 17   CREATININE 1.30* 1.64* 1.87*  CALCIUM  9.7 8.8* 9.1   Liver Function Tests: Recent Labs  Lab 07/02/24 0311  AST 29  ALT 20  ALKPHOS 160*  BILITOT 1.1  PROT 7.2  ALBUMIN  3.4*   CBG: Recent Labs  Lab 07/04/24 1738 07/04/24 1814 07/04/24 1836 07/04/24 2216 07/05/24 0748  GLUCAP 60* 69* 97 155* 129*    Scheduled Meds:  cefadroxil  500 mg Oral BID   doxycycline   100 mg Oral Q12H   insulin  aspart  0-9 Units Subcutaneous TID WC   insulin  aspart protamine - aspart  40 Units Subcutaneous Q breakfast   metoprolol  succinate  50 mg Oral Daily   pantoprazole   40 mg Oral Daily   rosuvastatin   40 mg Oral Daily   Warfarin - Pharmacist Dosing Inpatient   Does not apply q1600   Continuous Infusions:  sodium chloride  75 mL/hr at 07/05/24 0822    PRN Meds:.acetaminophen  **OR** acetaminophen ,  hydrOXYzine, ondansetron  **OR** ondansetron  (ZOFRAN ) IV, oxyCODONE   Joette Pebbles, MD 07/05/2024 9:31 AM  For on call review [email or web address].

## 2024-07-06 DIAGNOSIS — K219 Gastro-esophageal reflux disease without esophagitis: Secondary | ICD-10-CM | POA: Diagnosis not present

## 2024-07-06 DIAGNOSIS — M47894 Other spondylosis, thoracic region: Secondary | ICD-10-CM | POA: Diagnosis not present

## 2024-07-06 DIAGNOSIS — Z951 Presence of aortocoronary bypass graft: Secondary | ICD-10-CM | POA: Diagnosis not present

## 2024-07-06 DIAGNOSIS — I739 Peripheral vascular disease, unspecified: Secondary | ICD-10-CM | POA: Diagnosis not present

## 2024-07-06 DIAGNOSIS — R531 Weakness: Secondary | ICD-10-CM | POA: Diagnosis not present

## 2024-07-06 DIAGNOSIS — E7800 Pure hypercholesterolemia, unspecified: Secondary | ICD-10-CM | POA: Diagnosis not present

## 2024-07-06 DIAGNOSIS — I517 Cardiomegaly: Secondary | ICD-10-CM | POA: Diagnosis not present

## 2024-07-06 DIAGNOSIS — E1142 Type 2 diabetes mellitus with diabetic polyneuropathy: Secondary | ICD-10-CM | POA: Diagnosis not present

## 2024-07-06 DIAGNOSIS — I130 Hypertensive heart and chronic kidney disease with heart failure and stage 1 through stage 4 chronic kidney disease, or unspecified chronic kidney disease: Secondary | ICD-10-CM | POA: Diagnosis not present

## 2024-07-06 DIAGNOSIS — D500 Iron deficiency anemia secondary to blood loss (chronic): Secondary | ICD-10-CM | POA: Diagnosis not present

## 2024-07-06 DIAGNOSIS — R609 Edema, unspecified: Secondary | ICD-10-CM | POA: Diagnosis not present

## 2024-07-06 DIAGNOSIS — K449 Diaphragmatic hernia without obstruction or gangrene: Secondary | ICD-10-CM | POA: Diagnosis not present

## 2024-07-06 DIAGNOSIS — E1151 Type 2 diabetes mellitus with diabetic peripheral angiopathy without gangrene: Secondary | ICD-10-CM | POA: Diagnosis not present

## 2024-07-06 DIAGNOSIS — L03119 Cellulitis of unspecified part of limb: Secondary | ICD-10-CM | POA: Diagnosis not present

## 2024-07-06 DIAGNOSIS — Z6841 Body Mass Index (BMI) 40.0 and over, adult: Secondary | ICD-10-CM | POA: Diagnosis not present

## 2024-07-06 DIAGNOSIS — L0390 Cellulitis, unspecified: Secondary | ICD-10-CM | POA: Diagnosis not present

## 2024-07-06 DIAGNOSIS — I252 Old myocardial infarction: Secondary | ICD-10-CM | POA: Diagnosis not present

## 2024-07-06 DIAGNOSIS — Z7401 Bed confinement status: Secondary | ICD-10-CM | POA: Diagnosis not present

## 2024-07-06 DIAGNOSIS — E113493 Type 2 diabetes mellitus with severe nonproliferative diabetic retinopathy without macular edema, bilateral: Secondary | ICD-10-CM | POA: Diagnosis not present

## 2024-07-06 DIAGNOSIS — E8809 Other disorders of plasma-protein metabolism, not elsewhere classified: Secondary | ICD-10-CM | POA: Diagnosis not present

## 2024-07-06 DIAGNOSIS — R262 Difficulty in walking, not elsewhere classified: Secondary | ICD-10-CM | POA: Diagnosis not present

## 2024-07-06 DIAGNOSIS — I10 Essential (primary) hypertension: Secondary | ICD-10-CM | POA: Diagnosis not present

## 2024-07-06 DIAGNOSIS — R791 Abnormal coagulation profile: Secondary | ICD-10-CM | POA: Diagnosis not present

## 2024-07-06 DIAGNOSIS — I509 Heart failure, unspecified: Secondary | ICD-10-CM | POA: Diagnosis not present

## 2024-07-06 DIAGNOSIS — M25561 Pain in right knee: Secondary | ICD-10-CM | POA: Diagnosis not present

## 2024-07-06 DIAGNOSIS — G4733 Obstructive sleep apnea (adult) (pediatric): Secondary | ICD-10-CM | POA: Diagnosis not present

## 2024-07-06 DIAGNOSIS — M6281 Muscle weakness (generalized): Secondary | ICD-10-CM | POA: Diagnosis not present

## 2024-07-06 DIAGNOSIS — Z7901 Long term (current) use of anticoagulants: Secondary | ICD-10-CM | POA: Diagnosis not present

## 2024-07-06 DIAGNOSIS — I878 Other specified disorders of veins: Secondary | ICD-10-CM | POA: Diagnosis not present

## 2024-07-06 DIAGNOSIS — Z7409 Other reduced mobility: Secondary | ICD-10-CM | POA: Diagnosis not present

## 2024-07-06 DIAGNOSIS — I890 Lymphedema, not elsewhere classified: Secondary | ICD-10-CM | POA: Diagnosis not present

## 2024-07-06 DIAGNOSIS — N1832 Chronic kidney disease, stage 3b: Secondary | ICD-10-CM | POA: Diagnosis not present

## 2024-07-06 DIAGNOSIS — I480 Paroxysmal atrial fibrillation: Secondary | ICD-10-CM | POA: Diagnosis not present

## 2024-07-06 DIAGNOSIS — E1159 Type 2 diabetes mellitus with other circulatory complications: Secondary | ICD-10-CM | POA: Diagnosis not present

## 2024-07-06 DIAGNOSIS — I5032 Chronic diastolic (congestive) heart failure: Secondary | ICD-10-CM | POA: Diagnosis not present

## 2024-07-06 DIAGNOSIS — Z9861 Coronary angioplasty status: Secondary | ICD-10-CM | POA: Diagnosis not present

## 2024-07-06 DIAGNOSIS — K5900 Constipation, unspecified: Secondary | ICD-10-CM | POA: Diagnosis not present

## 2024-07-06 DIAGNOSIS — E66813 Obesity, class 3: Secondary | ICD-10-CM | POA: Diagnosis not present

## 2024-07-06 DIAGNOSIS — I2510 Atherosclerotic heart disease of native coronary artery without angina pectoris: Secondary | ICD-10-CM | POA: Diagnosis not present

## 2024-07-06 DIAGNOSIS — I152 Hypertension secondary to endocrine disorders: Secondary | ICD-10-CM | POA: Diagnosis not present

## 2024-07-06 DIAGNOSIS — M25562 Pain in left knee: Secondary | ICD-10-CM | POA: Diagnosis not present

## 2024-07-06 LAB — BASIC METABOLIC PANEL WITH GFR
Anion gap: 13 (ref 5–15)
BUN: 15 mg/dL (ref 8–23)
CO2: 25 mmol/L (ref 22–32)
Calcium: 9.1 mg/dL (ref 8.9–10.3)
Chloride: 97 mmol/L — ABNORMAL LOW (ref 98–111)
Creatinine, Ser: 1.53 mg/dL — ABNORMAL HIGH (ref 0.44–1.00)
GFR, Estimated: 35 mL/min — ABNORMAL LOW (ref >60)
Glucose, Bld: 113 mg/dL — ABNORMAL HIGH (ref 70–99)
Potassium: 4.2 mmol/L (ref 3.5–5.1)
Sodium: 135 mmol/L (ref 135–145)

## 2024-07-06 LAB — GLUCOSE, CAPILLARY
Glucose-Capillary: 121 mg/dL — ABNORMAL HIGH (ref 70–99)
Glucose-Capillary: 165 mg/dL — ABNORMAL HIGH (ref 70–99)

## 2024-07-06 LAB — PROTIME-INR
INR: 2.3 — ABNORMAL HIGH (ref 0.8–1.2)
Prothrombin Time: 26.6 s — ABNORMAL HIGH (ref 11.4–15.2)

## 2024-07-06 MED ORDER — DOXYCYCLINE HYCLATE 100 MG PO TABS: 100.0000 mg | ORAL_TABLET | Freq: Two times a day (BID) | ORAL | Status: AC

## 2024-07-06 MED ORDER — WARFARIN SODIUM 7.5 MG PO TABS: 3.7500 mg | ORAL_TABLET | Freq: Every day | ORAL | Status: AC

## 2024-07-06 MED ORDER — FUROSEMIDE 40 MG PO TABS: 20.0000 mg | ORAL_TABLET | Freq: Every day | ORAL | Status: AC

## 2024-07-06 MED ORDER — WARFARIN SODIUM 4 MG PO TABS
8.0000 mg | ORAL_TABLET | Freq: Once | ORAL | Status: AC
  Administered 2024-07-06: 8 mg via ORAL

## 2024-07-06 MED ORDER — SENNOSIDES-DOCUSATE SODIUM 8.6-50 MG PO TABS
2.0000 | ORAL_TABLET | Freq: Two times a day (BID) | ORAL | Status: DC
  Administered 2024-07-06: 2 via ORAL
  Filled 2024-07-06: qty 2

## 2024-07-06 MED ORDER — OXYCODONE HCL 5 MG PO TABS: 5.0000 mg | ORAL_TABLET | Freq: Four times a day (QID) | ORAL | 0 refills | Status: AC | PRN

## 2024-07-06 MED ORDER — WARFARIN SODIUM 10 MG PO TABS
10.0000 mg | ORAL_TABLET | Freq: Once | ORAL | Status: DC
Start: 2024-07-06 — End: 2024-07-06

## 2024-07-06 MED ORDER — CEFADROXIL 500 MG PO CAPS: 500.0000 mg | ORAL_CAPSULE | Freq: Two times a day (BID) | ORAL | Status: AC

## 2024-07-06 MED ORDER — WARFARIN SODIUM 4 MG PO TABS: 8.0000 mg | ORAL_TABLET | Freq: Once | ORAL | Status: DC

## 2024-07-06 MED ORDER — WARFARIN SODIUM 4 MG PO TABS
8.0000 mg | ORAL_TABLET | Freq: Once | ORAL | Status: DC
  Filled 2024-07-06: qty 2

## 2024-07-06 MED ORDER — SENNOSIDES-DOCUSATE SODIUM 8.6-50 MG PO TABS: 2.0000 | ORAL_TABLET | Freq: Two times a day (BID) | ORAL | Status: AC

## 2024-07-06 MED ORDER — SPIRONOLACTONE 25 MG PO TABS: 12.5000 mg | ORAL_TABLET | Freq: Every day | ORAL | Status: AC

## 2024-07-06 NOTE — TOC Progression Note (Addendum)
 Transition of Care [facility]) - Progression Note    Patient Details  Name: [name] MRN: [id number] Date of Birth: December 04, 1950  Transition of Care [facility]) CM/SW Contact  Sherline Clack, [place] Phone Number: 07/06/2024, 8:31 AM  Clinical Narrative:     Update 11:30 AM: Bed opened up at [facility]. Provider made aware. Patient anticipated to leave today.   Update 10:25 AM:CSW received a call back from [place] with [employer]. Patient's insurance was approved. Auth ID: [id number], patient has (5) days to transfer to SNF and once they are at the SNF, insurance has approved for (7) days. [facility] does not have beds at this moment, but will keep CSW updated if a bed opens up. CSW will reach out to weekend coverage.   CSW spoke with Jori from [facility] regarding patient's insurance authorization for SNF. [facility] is requesting a peer to peer to be completed by 2 pm today. The number to call is [phone]. Provider made aware. Patient's request for ambulance was approved, and the auth ID is [id number]. CSW will update patient and will continue to follow as discharge plan develops.   Expected Discharge Plan: Skilled Nursing Facility Barriers to Discharge: Continued Medical Work up, English as a second language teacher               Expected Discharge Plan and Services       Living arrangements for the past 2 months: Single Family Home                                       Social Drivers of Health (SDOH) Interventions SDOH Screenings   Food Insecurity: No Food Insecurity (07/02/2024)  Housing: Low Risk  (07/02/2024)  Transportation Needs: No Transportation Needs (07/02/2024)  Utilities: Not At Risk (07/02/2024)  Depression (PHQ2-9): Low Risk  (05/07/2020)  Social Connections: Unknown (07/02/2024)  Stress: Stress Concern Present (11/14/2019)  Tobacco Use: Medium Risk (07/02/2024)    Readmission Risk Interventions    04/12/2023    3:40 PM  Readmission Risk Prevention Plan   Transportation Screening Complete  PCP or Specialist Appt within 3-5 Days Complete  HRI or Home Care Consult Complete  Social Work Consult for Recovery Care Planning/Counseling Complete  Palliative Care Screening Not Applicable  Medication Review Oceanographer) Referral to Pharmacy

## 2024-07-06 NOTE — TOC Transition Note (Addendum)
 Transition of Care [facility]) - Discharge Note   Patient Details  Name: [name] MRN: [id number] Date of Birth: Jan 08, 1951  Transition of Care [facility]) CM/SW Contact:  Sherline Clack, LCSWA Phone Number: 07/06/2024, 2:03 PM   Clinical Narrative:     Patient will DC to: [facility] Anticipated DC date: 07/06/24  Family notified: son Transport by: [facility]   Per MD patient ready for DC to [facility]. RN to call report prior to discharge [phone], rm [phone]). RN, patient, patient's family, and facility notified of DC. Discharge Summary and FL2 sent to facility. DC packet on chart. Ambulance transport requested for patient.   CSW will sign off for now as social work intervention is no longer needed. Please consult us  again if new needs arise.    Final next level of care: Skilled Nursing Facility Barriers to Discharge: Barriers Resolved   Patient Goals and CMS Choice   CMS Medicare.gov Compare Post Acute Care list provided to:: Patient Choice offered to / list presented to : Patient      Discharge Placement   Existing PASRR number confirmed : 07/06/24          Patient [facility] bed at: [facility] Patient to be transferred to facility by: [facility] Name of family member notified: [facility] Patient and family notified of of transfer: 07/06/24  Discharge Plan and Services Additional resources added to the After Visit Summary for                                       Social Drivers of Health (SDOH) Interventions SDOH Screenings   Food Insecurity: No Food Insecurity (07/02/2024)  Housing: Low Risk  (07/02/2024)  Transportation Needs: No Transportation Needs (07/02/2024)  Utilities: Not At Risk (07/02/2024)  Depression (PHQ2-9): Low Risk  (05/07/2020)  Social Connections: Unknown (07/02/2024)  Stress: Stress Concern Present (11/14/2019)  Tobacco Use: Medium Risk (07/02/2024)     Readmission Risk Interventions     04/12/2023    3:40 PM  Readmission Risk Prevention Plan  Transportation Screening Complete  PCP or Specialist Appt within 3-5 Days Complete  HRI or Home Care Consult Complete  Social Work Consult for Recovery Care Planning/Counseling Complete  Palliative Care Screening Not Applicable  Medication Review Oceanographer) Referral to Pharmacy

## 2024-07-06 NOTE — Care Management Important Message (Signed)
 Important Message  Patient Details  Name: [name] MRN: [id number] Date of Birth: 26-Feb-1951   Important Message Given:  Yes - Medicare IM     [name] 07/06/2024, 2:50 PM

## 2024-07-06 NOTE — Plan of Care (Signed)

## 2024-07-06 NOTE — Discharge Instructions (Signed)
 Please get INR by Monday 10/13 at the latest

## 2024-07-06 NOTE — Progress Notes (Addendum)
 RN called report to [facility] and spoke with Alayne, LPN.

## 2024-07-06 NOTE — Discharge Summary (Addendum)
 [facility]  Physician Discharge Summary   Patient ID: [name] MRN: [id number] DOB/AGE: 74/12/1950 73 y.o.  Admit date: 07/01/2024 Discharge date:   07/06/2024   PCP: Verdia Lombard, MD  DISCHARGE DIAGNOSES:    Cellulitis involving right lower extremity   Chronic diastolic CHF (congestive heart failure) (HCC)   Type 2 diabetes mellitus with hyperlipidemia (HCC)   Essential hypertension   Paroxysmal atrial fibrillation (HCC)   CKD (chronic kidney disease) stage 3, GFR 30-59 ml/min (HCC)   Class 3 obesity (HCC)   Anemia   Esophageal reflux   CAD S/P percutaneous coronary angioplasty   Hypercholesterolemia   Supratherapeutic INR   RECOMMENDATIONS FOR OUTPATIENT FOLLOW UP: Please check PT/INR on daily basis until INR is stable between 2 and 3 for 3 consecutive days and then you can check as per protocol. Check CBC and basic metabolic panel in 3 to 4 days Daily wound care to right leg wounds as mentioned below   Home Health: SNF Equipment/Devices: None none  CODE STATUS: Full code  DISCHARGE CONDITION: fair  Diet recommendation: Modified carbohydrate  INITIAL HISTORY: 44F with a Hx of GERD/hiatal hernia, DM 2, HLD, HTN, CAD with LAD stent, P A-fib on warfarin, HFpEF, PVD, OSA not on CPAP, history of lung cancer and LUL VATS, chronic lower extremity wounds, presenting with worsening BL LE pain and redness.   HOSPITAL COURSE:   Cellulitis of the lower extremities Patient was found to have erythema involving the lower extremities right greater than left.  Patient was initially placed on broad-spectrum antibiotics.  Erythema is improved.  Swelling is improved.  Patient was changed over to doxycycline  and cefadroxil. Requiring daily wound care.   Acute exacerbation of chronic HFpEF Was also noted to have significant swelling of the lower extremities.  Was placed on IV diuretics with good diuresis.  Changed over to oral diuretics twice a day.   Furosemide  had to be held due to rising creatinine.  Creatinine improved with IV hydration.  Will resume at a lower dose from tomorrow.  Will also resume spironolactone  from tomorrow.  Labs will need to be checked in a few days.      AKI on CKD 3B Given IV fluids with improvement in renal function today.  AKI was most likely due to diuretics.  Check labs periodically in the outpatient setting.     Supratherapeutic INR INR is improving. .   Paroxysmal atrial fibrillation Daily metoprolol .  On warfarin for anticoagulation.  INR to be between 2 and 3.   CAD/HTN/HLD Warfarin, beta-blocker, statin.   Class III obesity Estimated body mass index is 47.49 kg/m as calculated from the following:   Height as of 06/08/24: 5' 3 (1.6 m).   Weight as of this encounter: 121.6 kg.  Patient is stable.  Okay for discharge to SNF when bed is available.   PERTINENT LABS:  The results of significant diagnostics from this hospitalization (including imaging, microbiology, ancillary and laboratory) are listed below for reference.    Microbiology: Recent Results (from the past 240 hours)  Blood culture (routine x 2)     Status: None (Preliminary result)   Collection Time: 07/02/24 12:41 AM   Specimen: BLOOD  Result Value Ref Range Status   Specimen Description   Final    BLOOD BLOOD LEFT FOREARM Performed at [facility], [address] [place], [place] [address]    Special Requests   Final    BOTTLES DRAWN AEROBIC AND ANAEROBIC Blood Culture  results may not be optimal due to an inadequate volume of blood received in culture bottles Performed at [facility], [address], [place], [place] [address]    Culture   Final    NO GROWTH 4 DAYS Performed at [facility], [address], [place], [place] [address]    Report Status PENDING  Incomplete  Blood culture (routine x 2)     Status: None (Preliminary result)   Collection Time: 07/02/24 10:40 AM    Specimen: BLOOD  Result Value Ref Range Status   Specimen Description BLOOD SITE NOT SPECIFIED  Final   Special Requests   Final    BOTTLES DRAWN AEROBIC AND ANAEROBIC Blood Culture adequate volume   Culture   Final    NO GROWTH 4 DAYS Performed at [facility], [address], [place], [place] [address]    Report Status PENDING  Incomplete     Labs:   Basic Metabolic Panel: Recent Labs  Lab 07/02/24 0311 07/04/24 0340 07/05/24 0319 07/06/24 0527  NA 137 135 135 135  K 4.0 3.6 4.2 4.2  CL 103 98 98 97*  CO2 24 27 26 25   GLUCOSE 87 146* 130* 113*  BUN 15 16 17 15   CREATININE 1.30* 1.64* 1.87* 1.53*  CALCIUM  9.7 8.8* 9.1 9.1   Liver Function Tests: Recent Labs  Lab 07/02/24 0311  AST 29  ALT 20  ALKPHOS 160*  BILITOT 1.1  PROT 7.2  ALBUMIN  3.4*   CBC: Recent Labs  Lab 07/02/24 0039 07/04/24 0340  WBC 7.9 6.3  NEUTROABS 5.8  --   HGB 10.9* 9.3*  HCT 36.8 30.7*  MCV 87.2 86.5  PLT 217 190    ProBNP (last 3 results) Recent Labs    07/02/24 0311  PROBNP 2,537.0*    CBG: Recent Labs  Lab 07/05/24 0748 07/05/24 1203 07/05/24 1605 07/05/24 2059 07/06/24 0804  GLUCAP 129* 113* 86 115* 121*     IMAGING STUDIES DG Tibia/Fibula Right Result Date: 07/02/2024 CLINICAL DATA:  Leg wound. EXAM: RIGHT TIBIA AND FIBULA - 2 VIEW; PORTABLE CHEST - 1 VIEW COMPARISON:  Chest CT without contrast 08/19/2023, AP Lat chest 04/09/2023, right knee series 10/04/2022, left foot series 04/20/2024. FINDINGS: Chest AP portable 2:09 a.m.: Stable cardiomegaly. CABG changes. Old left upper lobectomy. Central vascular prominence is again seen without overt edema. The lungs are clear. The mediastinum is stable. There is aortic atherosclerosis. Thoracic spondylosis. No new osseous abnormality. AP Lat right tibia fibula two views in 4 films: Vascular calcifications, heaviest in the superficial femoral artery. There is diffuse stranding edema throughout the foreleg, ankle and  foot. No soft tissue gas or visible foreign body. No evidence of tibial/fibular fractures or other focal bone lesions. There is advanced tricompartmental arthrosis of the left knee. Mild arthrosis of the ankle. Vascular calcifications continue throughout the foreleg into the foot. IMPRESSION: 1. Stable cardiomegaly and central vascular prominence without overt edema. 2. No other evidence of acute chest disease. 3. Diffuse stranding edema throughout the right foreleg, ankle and foot. No soft tissue gas or visible foreign body. 4. No evidence of tibial/fibular fractures or other focal bone lesions. 5. Vascular calcifications. 6. Advanced tricompartmental arthrosis of the left knee. Electronically Signed   By: Francis Quam M.D.   On: 07/02/2024 03:07   DG Chest Portable 1 View Result Date: 07/02/2024 CLINICAL DATA:  Leg wound. EXAM: RIGHT TIBIA AND FIBULA - 2 VIEW; PORTABLE CHEST - 1 VIEW  COMPARISON:  Chest CT without contrast 08/19/2023, AP Lat chest 04/09/2023, right knee series 10/04/2022, left foot series 04/20/2024. FINDINGS: Chest AP portable 2:09 a.m.: Stable cardiomegaly. CABG changes. Old left upper lobectomy. Central vascular prominence is again seen without overt edema. The lungs are clear. The mediastinum is stable. There is aortic atherosclerosis. Thoracic spondylosis. No new osseous abnormality. AP Lat right tibia fibula two views in 4 films: Vascular calcifications, heaviest in the superficial femoral artery. There is diffuse stranding edema throughout the foreleg, ankle and foot. No soft tissue gas or visible foreign body. No evidence of tibial/fibular fractures or other focal bone lesions. There is advanced tricompartmental arthrosis of the left knee. Mild arthrosis of the ankle. Vascular calcifications continue throughout the foreleg into the foot. IMPRESSION: 1. Stable cardiomegaly and central vascular prominence without overt edema. 2. No other evidence of acute chest disease. 3. Diffuse  stranding edema throughout the right foreleg, ankle and foot. No soft tissue gas or visible foreign body. 4. No evidence of tibial/fibular fractures or other focal bone lesions. 5. Vascular calcifications. 6. Advanced tricompartmental arthrosis of the left knee. Electronically Signed   By: Francis Quam M.D.   On: 07/02/2024 03:07    DISCHARGE EXAMINATION: Vitals:   07/05/24 2106 07/05/24 2355 07/06/24 0410 07/06/24 0803  BP: (!) 112/46 123/61 (!) 126/55 (!) 159/83  Pulse: 86 91 77 (!) 106  Resp: 18 18 16    Temp: 98.4 F (36.9 C) 98 F (36.7 C) 98 F (36.7 C) 98 F (36.7 C)  TempSrc: Oral Oral Oral   SpO2: 95% 100% 97% 98%  Weight:       General appearance: Awake alert.  In no distress Resp: Clear to auscultation bilaterally.  Normal effort Cardio: S1-S2 is normal regular.  No S3-S4.  No rubs murmurs or bruit GI: Abdomen is soft.  Nontender nondistended.  Bowel sounds are present normal.  No masses organomegaly Extremities: Right lower extremity covered in dressing.  Physical deconditioning is noted. No obvious focal neurological deficits  DISPOSITION: SNF  Discharge Instructions     Call MD for:  difficulty breathing, headache or visual disturbances   Complete by: As directed    Call MD for:  extreme fatigue   Complete by: As directed    Call MD for:  persistant dizziness or light-headedness   Complete by: As directed    Call MD for:  persistant nausea and vomiting   Complete by: As directed    Call MD for:  redness, tenderness, or signs of infection (pain, swelling, redness, odor or green/yellow discharge around incision site)   Complete by: As directed    Call MD for:  severe uncontrolled pain   Complete by: As directed    Call MD for:  temperature >100.4   Complete by: As directed    Diet - low sodium heart healthy   Complete by: As directed    Discharge instructions   Complete by: As directed    Please review instructions on the discharge summary.  You were  cared for by a hospitalist during your hospital stay. If you have any questions about your discharge medications or the care you received while you were in the hospital after you are discharged, you can call the unit and asked to speak with the hospitalist on call if the hospitalist that took care of you is not available. Once you are discharged, your primary care physician will handle any further medical issues. Please note that NO REFILLS for any discharge  medications will be authorized once you are discharged, as it is imperative that you return to your primary care physician (or establish a relationship with a primary care physician if you do not have one) for your aftercare needs so that they can reassess your need for medications and monitor your lab values. If you do not have a primary care physician, you can call [phone] for a physician referral.   Discharge wound care:   Complete by: As directed    Wound care  Every shift      Comments: Cleanse with NS, pat dry.  Place Calcium  alginate (lawson [phone]) over wound bed, cover with dry 4x4 gauze and wrap with Kerlix or cover with silicone foam dressing which ever is preferred. Change daily.   Increase activity slowly   Complete by: As directed         Allergies as of 07/06/2024       Reactions   Levofloxacin Itching   Morphine  And Codeine Itching, Other (See Comments)   Pt reports oxycodone  history with no allergy.        Medication List     STOP taking these medications    lisinopril  10 MG tablet Commonly known as: ZESTRIL        TAKE these medications    acetaminophen  325 MG tablet Commonly known as: TYLENOL  Take 2 tablets (650 mg total) by mouth every 6 (six) hours as needed for mild pain (or Fever >/= 101).   cefadroxil 500 MG capsule Commonly known as: DURICEF Take 1 capsule (500 mg total) by mouth 2 (two) times daily for 5 days.   doxycycline  100 MG tablet Commonly known as: VIBRA -TABS Take 1 tablet (100 mg  total) by mouth every 12 (twelve) hours for 5 days.   furosemide  40 MG tablet Commonly known as: LASIX  Take 0.5 tablets (20 mg total) by mouth daily. Resume on 07/07/2024 Start taking on: July 07, 2024 What changed:  how much to take how to take this when to take this additional instructions   insulin  NPH-regular Human (70-30) 100 UNIT/ML injection Inject 20 Units into the skin 2 (two) times daily with a meal. What changed:  how much to take additional instructions   metoCLOPramide  10 MG tablet Commonly known as: REGLAN  Take 10 mg by mouth 4 (four) times daily.   metoprolol  succinate 50 MG 24 hr tablet Commonly known as: TOPROL -XL Take 1 tablet (50 mg total) by mouth daily. Take with or immediately following a meal.   metoprolol  tartrate 25 MG tablet Commonly known as: LOPRESSOR  Take 1 table as needed for heart rate above 120bpm.   OneTouch Delica Plus Lancet33G Misc Apply topically.   OneTouch Verio Flex System w/Device Kit   OneTouch Verio test strip Generic drug: glucose blood 1 each 2 (two) times daily.   oxyCODONE  5 MG immediate release tablet Commonly known as: Oxy IR/ROXICODONE  Take 1 tablet (5 mg total) by mouth every 6 (six) hours as needed for severe pain (pain score 7-10).   pantoprazole  40 MG tablet Commonly known as: PROTONIX  Take 40 mg by mouth daily.   rosuvastatin  40 MG tablet Commonly known as: CRESTOR  Take 40 mg by mouth daily.   senna-docusate 8.6-50 MG tablet Commonly known as: Senokot-S Take 2 tablets by mouth 2 (two) times daily.   spironolactone  25 MG tablet Commonly known as: ALDACTONE  Take 0.5 tablets (12.5 mg total) by mouth daily. Resume on 07/07/2024 Start taking on: July 07, 2024 What changed:  how much to take additional  instructions   Vitamin D3 50 MCG (2000 UT) Tabs Take 2,000 Units by mouth daily.   warfarin 7.5 MG tablet Commonly known as: COUMADIN  Take as directed. If you are unsure how to take this  medication, talk to your nurse or doctor. Original instructions: Take 0.5-1 tablets (3.75-7.5 mg total) by mouth daily. Take 3.75mg  (half tablet) on Mon/Wed and 7.5mg  (1 tablet) on all other days. OR as directed by Coumadin  clinic What changed: See the new instructions. Notes to patient: **Please get INR by Monday 10/13 at the latest**               Durable Medical Equipment  (From admission, onward)           Start     Ordered   07/02/24 1604  For home use only DME Bedside commode  Once       Comments: Please provide bariatric 3:1 since patient is unable to mobilize to bathroom facilities at the home. Patient is 5'3 and 121 kg.  Question Answer Comment  Patient needs a bedside commode to treat with the following condition Leg pain, anterior, right   Patient needs a bedside commode to treat with the following condition Physical deconditioning      07/02/24 1603              Discharge Care Instructions  (From admission, onward)           Start     Ordered   07/06/24 0000  Discharge wound care:       Comments: Wound care  Every shift      Comments: Cleanse with NS, pat dry.  Place Calcium  alginate (lawson [phone]) over wound bed, cover with dry 4x4 gauze and wrap with Kerlix or cover with silicone foam dressing which ever is preferred. Change daily.   07/06/24 0955              Contact information for follow-up providers     [facility], [facility] Follow up.   Why: [facility] will provide a bariatric 3:1 to the bedside prior to discharge. Contact information: [address] [place] [address] [phone]         Verdia Lombard, MD. Schedule an appointment as soon as possible for a visit in 1 week(s).   Specialty: Internal Medicine Why: post hospitalization follow up Contact information: [address] [place] [address] [phone]              Contact information for after-discharge care     Destination      [facility] .   Service: Skilled Nursing Contact information: [address] [place]  [address] [phone]                     TOTAL DISCHARGE TIME: 35 minutes  Divine Imber Verdene  [facility] Pager on [email or web address]  07/06/2024, 11:38 AM

## 2024-07-06 NOTE — Progress Notes (Addendum)
 PHARMACY - ANTICOAGULATION CONSULT NOTE  Pharmacy Consult for warfarin Indication: atrial fibrillation  Allergies  Allergen Reactions   Levofloxacin Itching   Morphine  And Codeine Itching and Other (See Comments)    Pt reports oxycodone  history with no allergy.    Patient Measurements: Weight: 121.6 kg (268 lb 1.3 oz) (OV 06/08/24)  Vital Signs: Temp: 98 F (36.7 C) (10/10 0803) Temp Source: Oral (10/10 0410) BP: 159/83 (10/10 0803) Pulse Rate: 106 (10/10 0803)  Labs: Recent Labs    07/04/24 0340 07/05/24 0319 07/06/24 0527  HGB 9.3*  --   --   HCT 30.7*  --   --   PLT 190  --   --   LABPROT 43.1* 34.7* 26.6*  INR 4.3* 3.3* 2.3*  CREATININE 1.64* 1.87* 1.53*    Estimated Creatinine Clearance: 40.8 mL/min (A) (by C-G formula based on SCr of 1.53 mg/dL (H)).   Medical History: Past Medical History:  Diagnosis Date   Arthritis    knees (02/05/2016)   Cancer (HCC) 5/10   L[facility] Vats    CHF (congestive heart failure) (HCC)    Coronary artery disease    GERD (gastroesophageal reflux disease)    tx meds   H/O hiatal hernia    History of blood transfusion several   last april 2015 s/p knee surgery (02/05/2016)   Hypercholesteremia    Hypertension    Lung cancer (HCC)    2010, surgery 18% left lung no radiation or chemo   Non-STEMI (non-ST elevated myocardial infarction) (HCC) 04/07/2012   See cath results below   Paroxysmal atrial fibrillation (HCC) 04/07/2012   On Warfarin   Peripheral vascular disease 8/12   Lt SFA PTA   Presence of stent in LAD coronary artery 04/07/2012   Xience Expedition DES 2.75 mm x 18 mm (dilated to 3.0 mm)   Sleep apnea    does not wear CPAP   Type II diabetes mellitus (HCC)    insulin  dependent    Medications:  Medications Prior to Admission  Medication Sig Dispense Refill Last Dose/Taking   acetaminophen  (TYLENOL ) 325 MG tablet Take 2 tablets (650 mg total) by mouth every 6 (six) hours as needed for mild pain (or Fever >/=  101). 20 tablet 0 Unknown   Cholecalciferol  (VITAMIN D3) 50 MCG (2000 UT) TABS Take 2,000 Units by mouth daily.   07/01/2024 Morning   furosemide  (LASIX ) 40 MG tablet Take 40 mg daily until weight is 244 pounds. Once weight reaches 244, take 40 mg every other day. 90 tablet 3 Unknown   insulin  NPH-regular Human (70-30) 100 UNIT/ML injection Inject 20 Units into the skin 2 (two) times daily with a meal. (Patient taking differently: Inject 20-50 Units into the skin 2 (two) times daily with a meal. 50 Units in the AM and PRN in the PM) 12 mL 0 07/01/2024 Morning   Lancets (ONETOUCH DELICA PLUS LANCET33G) MISC Apply topically.   Unknown   lisinopril  (ZESTRIL ) 10 MG tablet Take 1 tablet (10 mg total) by mouth daily. 30 tablet 0 07/01/2024 Morning   metoCLOPramide  (REGLAN ) 10 MG tablet Take 10 mg by mouth 4 (four) times daily.   07/01/2024 Morning   metoprolol  succinate (TOPROL -XL) 50 MG 24 hr tablet Take 1 tablet (50 mg total) by mouth daily. Take with or immediately following a meal. 90 tablet 3 07/01/2024 Morning   metoprolol  tartrate (LOPRESSOR ) 25 MG tablet Take 1 table as needed for heart rate above 120bpm. 90 tablet 3 Unknown   ONETOUCH  VERIO test strip 1 each 2 (two) times daily.   Unknown   pantoprazole  (PROTONIX ) 40 MG tablet Take 40 mg by mouth daily.   07/01/2024 Morning   rosuvastatin  (CRESTOR ) 40 MG tablet Take 40 mg by mouth daily.   07/01/2024 Morning   spironolactone  (ALDACTONE ) 25 MG tablet Take 1 tablet (25 mg total) by mouth daily. Take 25 mg daily until your weight is 244 pounds. Once your weight reaches 244 pounds, then take 25 mg every other day. 90 tablet 3 Unknown   warfarin (COUMADIN ) 7.5 MG tablet TAKE 1/2 TABLET TO 1 TABLET BY MOUTH DAILY AS DIRECTED BY COUMADIN  CLINIC (Patient taking differently: Take 3.75-7.5 mg by mouth as directed. 3.75 MG : Monday And Wednesday 7.5 MG : Tues, Thurs, Fri, Sat, Sun) 65 tablet 1 07/01/2024 at  7:30 PM   Blood Glucose Monitoring Suppl (ONETOUCH VERIO  FLEX SYSTEM) w/Device KIT    Unknown    Assessment: 73 yo F admitted with RLE cellulitis and possible UTI started on antibiotics. She takes warfarin PTA for Afib. Pharmacy consulted to manage warfarin inpatient.   Antibiotics changed to Doxycycline  and Cefadroxil both of which have a moderate DDI with warfarin and could elevate INR.  Will monitor.  INR down to 2.3 despite 3mg  dose. PO intake not charted.   PTA regimen: 7.5 mg daily except 3.75 mg MW, last dose 10/5  Goal of Therapy:  INR 2-3 Monitor platelets by anticoagulation protocol: Yes   Plan:  Warfarin 8 mg x1  Monitor daily INR, signs/symptoms of bleeding    Jinnie Door, PharmD, BCPS, BCCP Clinical Pharmacist  Please check AMION for all [facility] Pharm[facility] phone numbers After 10:00 PM, call Main Pharmacy [phone]

## 2024-07-07 DIAGNOSIS — I517 Cardiomegaly: Secondary | ICD-10-CM | POA: Diagnosis not present

## 2024-07-07 DIAGNOSIS — K219 Gastro-esophageal reflux disease without esophagitis: Secondary | ICD-10-CM | POA: Diagnosis not present

## 2024-07-07 DIAGNOSIS — I2510 Atherosclerotic heart disease of native coronary artery without angina pectoris: Secondary | ICD-10-CM | POA: Diagnosis not present

## 2024-07-07 DIAGNOSIS — N1832 Chronic kidney disease, stage 3b: Secondary | ICD-10-CM | POA: Diagnosis not present

## 2024-07-07 DIAGNOSIS — Z6841 Body Mass Index (BMI) 40.0 and over, adult: Secondary | ICD-10-CM | POA: Diagnosis not present

## 2024-07-07 DIAGNOSIS — Z9861 Coronary angioplasty status: Secondary | ICD-10-CM | POA: Diagnosis not present

## 2024-07-07 DIAGNOSIS — Z7901 Long term (current) use of anticoagulants: Secondary | ICD-10-CM | POA: Diagnosis not present

## 2024-07-07 DIAGNOSIS — I890 Lymphedema, not elsewhere classified: Secondary | ICD-10-CM | POA: Diagnosis not present

## 2024-07-07 DIAGNOSIS — R262 Difficulty in walking, not elsewhere classified: Secondary | ICD-10-CM | POA: Diagnosis not present

## 2024-07-07 DIAGNOSIS — K449 Diaphragmatic hernia without obstruction or gangrene: Secondary | ICD-10-CM | POA: Diagnosis not present

## 2024-07-07 DIAGNOSIS — L0390 Cellulitis, unspecified: Secondary | ICD-10-CM | POA: Diagnosis not present

## 2024-07-07 DIAGNOSIS — I5032 Chronic diastolic (congestive) heart failure: Secondary | ICD-10-CM | POA: Diagnosis not present

## 2024-07-07 LAB — CULTURE, BLOOD (ROUTINE X 2)
Culture: NO GROWTH
Culture: NO GROWTH
Special Requests: ADEQUATE

## 2024-07-08 DIAGNOSIS — Z7409 Other reduced mobility: Secondary | ICD-10-CM | POA: Diagnosis not present

## 2024-07-08 DIAGNOSIS — M25561 Pain in right knee: Secondary | ICD-10-CM | POA: Diagnosis not present

## 2024-07-08 DIAGNOSIS — M25562 Pain in left knee: Secondary | ICD-10-CM | POA: Diagnosis not present

## 2024-07-08 DIAGNOSIS — K5900 Constipation, unspecified: Secondary | ICD-10-CM | POA: Diagnosis not present

## 2024-07-08 DIAGNOSIS — R262 Difficulty in walking, not elsewhere classified: Secondary | ICD-10-CM | POA: Diagnosis not present

## 2024-07-08 DIAGNOSIS — M6281 Muscle weakness (generalized): Secondary | ICD-10-CM | POA: Diagnosis not present

## 2024-07-09 DIAGNOSIS — M25562 Pain in left knee: Secondary | ICD-10-CM | POA: Diagnosis not present

## 2024-07-09 DIAGNOSIS — R262 Difficulty in walking, not elsewhere classified: Secondary | ICD-10-CM | POA: Diagnosis not present

## 2024-07-09 DIAGNOSIS — Z7409 Other reduced mobility: Secondary | ICD-10-CM | POA: Diagnosis not present

## 2024-07-09 DIAGNOSIS — M25561 Pain in right knee: Secondary | ICD-10-CM | POA: Diagnosis not present

## 2024-07-09 DIAGNOSIS — M6281 Muscle weakness (generalized): Secondary | ICD-10-CM | POA: Diagnosis not present

## 2024-07-10 DIAGNOSIS — R531 Weakness: Secondary | ICD-10-CM | POA: Diagnosis not present

## 2024-07-10 DIAGNOSIS — E1151 Type 2 diabetes mellitus with diabetic peripheral angiopathy without gangrene: Secondary | ICD-10-CM | POA: Diagnosis not present

## 2024-07-10 DIAGNOSIS — E1142 Type 2 diabetes mellitus with diabetic polyneuropathy: Secondary | ICD-10-CM | POA: Diagnosis not present

## 2024-07-10 DIAGNOSIS — E66813 Obesity, class 3: Secondary | ICD-10-CM | POA: Diagnosis not present

## 2024-07-10 DIAGNOSIS — I480 Paroxysmal atrial fibrillation: Secondary | ICD-10-CM | POA: Diagnosis not present

## 2024-07-10 DIAGNOSIS — N1832 Chronic kidney disease, stage 3b: Secondary | ICD-10-CM | POA: Diagnosis not present

## 2024-07-10 DIAGNOSIS — Z7409 Other reduced mobility: Secondary | ICD-10-CM | POA: Diagnosis not present

## 2024-07-10 DIAGNOSIS — M25561 Pain in right knee: Secondary | ICD-10-CM | POA: Diagnosis not present

## 2024-07-10 DIAGNOSIS — Z7901 Long term (current) use of anticoagulants: Secondary | ICD-10-CM | POA: Diagnosis not present

## 2024-07-10 DIAGNOSIS — L0390 Cellulitis, unspecified: Secondary | ICD-10-CM | POA: Diagnosis not present

## 2024-07-10 DIAGNOSIS — I5032 Chronic diastolic (congestive) heart failure: Secondary | ICD-10-CM | POA: Diagnosis not present

## 2024-07-10 DIAGNOSIS — M25562 Pain in left knee: Secondary | ICD-10-CM | POA: Diagnosis not present

## 2024-07-11 DIAGNOSIS — Z7901 Long term (current) use of anticoagulants: Secondary | ICD-10-CM | POA: Diagnosis not present

## 2024-07-11 DIAGNOSIS — K5900 Constipation, unspecified: Secondary | ICD-10-CM | POA: Diagnosis not present

## 2024-07-11 DIAGNOSIS — R531 Weakness: Secondary | ICD-10-CM | POA: Diagnosis not present

## 2024-07-11 DIAGNOSIS — Z7409 Other reduced mobility: Secondary | ICD-10-CM | POA: Diagnosis not present

## 2024-07-11 DIAGNOSIS — I480 Paroxysmal atrial fibrillation: Secondary | ICD-10-CM | POA: Diagnosis not present

## 2024-07-12 DIAGNOSIS — Z7901 Long term (current) use of anticoagulants: Secondary | ICD-10-CM | POA: Diagnosis not present

## 2024-07-12 DIAGNOSIS — R531 Weakness: Secondary | ICD-10-CM | POA: Diagnosis not present

## 2024-07-12 DIAGNOSIS — I480 Paroxysmal atrial fibrillation: Secondary | ICD-10-CM | POA: Diagnosis not present

## 2024-07-12 DIAGNOSIS — I5032 Chronic diastolic (congestive) heart failure: Secondary | ICD-10-CM | POA: Diagnosis not present

## 2024-07-12 DIAGNOSIS — Z7409 Other reduced mobility: Secondary | ICD-10-CM | POA: Diagnosis not present

## 2024-07-15 DIAGNOSIS — R531 Weakness: Secondary | ICD-10-CM | POA: Diagnosis not present

## 2024-07-15 DIAGNOSIS — E1159 Type 2 diabetes mellitus with other circulatory complications: Secondary | ICD-10-CM | POA: Diagnosis not present

## 2024-07-15 DIAGNOSIS — L0390 Cellulitis, unspecified: Secondary | ICD-10-CM | POA: Diagnosis not present

## 2024-07-15 DIAGNOSIS — I152 Hypertension secondary to endocrine disorders: Secondary | ICD-10-CM | POA: Diagnosis not present

## 2024-07-15 DIAGNOSIS — I480 Paroxysmal atrial fibrillation: Secondary | ICD-10-CM | POA: Diagnosis not present

## 2024-07-15 DIAGNOSIS — N1832 Chronic kidney disease, stage 3b: Secondary | ICD-10-CM | POA: Diagnosis not present

## 2024-07-15 DIAGNOSIS — Z7901 Long term (current) use of anticoagulants: Secondary | ICD-10-CM | POA: Diagnosis not present

## 2024-07-15 DIAGNOSIS — Z7409 Other reduced mobility: Secondary | ICD-10-CM | POA: Diagnosis not present

## 2024-07-15 DIAGNOSIS — I5032 Chronic diastolic (congestive) heart failure: Secondary | ICD-10-CM | POA: Diagnosis not present

## 2024-07-17 DIAGNOSIS — R531 Weakness: Secondary | ICD-10-CM | POA: Diagnosis not present

## 2024-07-17 DIAGNOSIS — Z7901 Long term (current) use of anticoagulants: Secondary | ICD-10-CM | POA: Diagnosis not present

## 2024-07-17 DIAGNOSIS — L0390 Cellulitis, unspecified: Secondary | ICD-10-CM | POA: Diagnosis not present

## 2024-07-17 DIAGNOSIS — R609 Edema, unspecified: Secondary | ICD-10-CM | POA: Diagnosis not present

## 2024-07-17 DIAGNOSIS — Z7409 Other reduced mobility: Secondary | ICD-10-CM | POA: Diagnosis not present

## 2024-07-17 DIAGNOSIS — I480 Paroxysmal atrial fibrillation: Secondary | ICD-10-CM | POA: Diagnosis not present

## 2024-07-18 DIAGNOSIS — I878 Other specified disorders of veins: Secondary | ICD-10-CM | POA: Diagnosis not present

## 2024-07-18 DIAGNOSIS — R609 Edema, unspecified: Secondary | ICD-10-CM | POA: Diagnosis not present

## 2024-07-18 DIAGNOSIS — L0390 Cellulitis, unspecified: Secondary | ICD-10-CM | POA: Diagnosis not present

## 2024-07-18 DIAGNOSIS — I509 Heart failure, unspecified: Secondary | ICD-10-CM | POA: Diagnosis not present

## 2024-07-18 DIAGNOSIS — I480 Paroxysmal atrial fibrillation: Secondary | ICD-10-CM | POA: Diagnosis not present

## 2024-07-19 DIAGNOSIS — Z7409 Other reduced mobility: Secondary | ICD-10-CM | POA: Diagnosis not present

## 2024-07-19 DIAGNOSIS — I878 Other specified disorders of veins: Secondary | ICD-10-CM | POA: Diagnosis not present

## 2024-07-19 DIAGNOSIS — N1832 Chronic kidney disease, stage 3b: Secondary | ICD-10-CM | POA: Diagnosis not present

## 2024-07-19 DIAGNOSIS — I130 Hypertensive heart and chronic kidney disease with heart failure and stage 1 through stage 4 chronic kidney disease, or unspecified chronic kidney disease: Secondary | ICD-10-CM | POA: Diagnosis not present

## 2024-07-19 DIAGNOSIS — Z7901 Long term (current) use of anticoagulants: Secondary | ICD-10-CM | POA: Diagnosis not present

## 2024-07-19 DIAGNOSIS — R531 Weakness: Secondary | ICD-10-CM | POA: Diagnosis not present

## 2024-07-19 DIAGNOSIS — I480 Paroxysmal atrial fibrillation: Secondary | ICD-10-CM | POA: Diagnosis not present

## 2024-07-22 DIAGNOSIS — R531 Weakness: Secondary | ICD-10-CM | POA: Diagnosis not present

## 2024-07-22 DIAGNOSIS — I130 Hypertensive heart and chronic kidney disease with heart failure and stage 1 through stage 4 chronic kidney disease, or unspecified chronic kidney disease: Secondary | ICD-10-CM | POA: Diagnosis not present

## 2024-07-22 DIAGNOSIS — N1832 Chronic kidney disease, stage 3b: Secondary | ICD-10-CM | POA: Diagnosis not present

## 2024-07-22 DIAGNOSIS — I480 Paroxysmal atrial fibrillation: Secondary | ICD-10-CM | POA: Diagnosis not present

## 2024-07-22 DIAGNOSIS — Z7901 Long term (current) use of anticoagulants: Secondary | ICD-10-CM | POA: Diagnosis not present

## 2024-07-22 DIAGNOSIS — Z7409 Other reduced mobility: Secondary | ICD-10-CM | POA: Diagnosis not present

## 2024-07-23 DIAGNOSIS — Z7901 Long term (current) use of anticoagulants: Secondary | ICD-10-CM | POA: Diagnosis not present

## 2024-07-23 DIAGNOSIS — Z7409 Other reduced mobility: Secondary | ICD-10-CM | POA: Diagnosis not present

## 2024-07-23 DIAGNOSIS — I480 Paroxysmal atrial fibrillation: Secondary | ICD-10-CM | POA: Diagnosis not present

## 2024-07-23 DIAGNOSIS — N1832 Chronic kidney disease, stage 3b: Secondary | ICD-10-CM | POA: Diagnosis not present

## 2024-07-23 DIAGNOSIS — R531 Weakness: Secondary | ICD-10-CM | POA: Diagnosis not present

## 2024-07-24 DIAGNOSIS — R531 Weakness: Secondary | ICD-10-CM | POA: Diagnosis not present

## 2024-07-24 DIAGNOSIS — I480 Paroxysmal atrial fibrillation: Secondary | ICD-10-CM | POA: Diagnosis not present

## 2024-07-24 DIAGNOSIS — Z7901 Long term (current) use of anticoagulants: Secondary | ICD-10-CM | POA: Diagnosis not present

## 2024-07-24 DIAGNOSIS — R791 Abnormal coagulation profile: Secondary | ICD-10-CM | POA: Diagnosis not present

## 2024-07-24 DIAGNOSIS — Z7409 Other reduced mobility: Secondary | ICD-10-CM | POA: Diagnosis not present

## 2024-07-25 DIAGNOSIS — I480 Paroxysmal atrial fibrillation: Secondary | ICD-10-CM | POA: Diagnosis not present

## 2024-07-25 DIAGNOSIS — R791 Abnormal coagulation profile: Secondary | ICD-10-CM | POA: Diagnosis not present

## 2024-07-25 DIAGNOSIS — Z7901 Long term (current) use of anticoagulants: Secondary | ICD-10-CM | POA: Diagnosis not present

## 2024-07-25 DIAGNOSIS — Z7409 Other reduced mobility: Secondary | ICD-10-CM | POA: Diagnosis not present

## 2024-07-25 DIAGNOSIS — R531 Weakness: Secondary | ICD-10-CM | POA: Diagnosis not present

## 2024-07-26 DIAGNOSIS — Z7409 Other reduced mobility: Secondary | ICD-10-CM | POA: Diagnosis not present

## 2024-07-26 DIAGNOSIS — Z7901 Long term (current) use of anticoagulants: Secondary | ICD-10-CM | POA: Diagnosis not present

## 2024-07-26 DIAGNOSIS — R531 Weakness: Secondary | ICD-10-CM | POA: Diagnosis not present

## 2024-07-26 DIAGNOSIS — I480 Paroxysmal atrial fibrillation: Secondary | ICD-10-CM | POA: Diagnosis not present

## 2024-09-17 ENCOUNTER — Other Ambulatory Visit: Payer: Self-pay | Admitting: Internal Medicine

## 2024-09-17 DIAGNOSIS — Z1231 Encounter for screening mammogram for malignant neoplasm of breast: Secondary | ICD-10-CM

## 2024-09-25 ENCOUNTER — Ambulatory Visit: Admitting: Cardiovascular Disease

## 2024-10-03 ENCOUNTER — Ambulatory Visit: Admitting: Cardiovascular Disease

## 2024-10-03 ENCOUNTER — Ambulatory Visit

## 2024-10-09 ENCOUNTER — Ambulatory Visit
Admission: RE | Admit: 2024-10-09 | Discharge: 2024-10-09 | Disposition: A | Source: Ambulatory Visit | Attending: Internal Medicine | Admitting: Internal Medicine

## 2024-10-09 DIAGNOSIS — Z1231 Encounter for screening mammogram for malignant neoplasm of breast: Secondary | ICD-10-CM

## 2024-10-10 ENCOUNTER — Ambulatory Visit

## 2024-10-22 ENCOUNTER — Ambulatory Visit: Admitting: Cardiology

## 2024-10-22 ENCOUNTER — Ambulatory Visit

## 2024-10-29 ENCOUNTER — Ambulatory Visit

## 2024-10-29 ENCOUNTER — Encounter (HOSPITAL_BASED_OUTPATIENT_CLINIC_OR_DEPARTMENT_OTHER): Admitting: General Surgery

## 2024-10-30 ENCOUNTER — Ambulatory Visit

## 2024-11-15 ENCOUNTER — Ambulatory Visit: Admitting: Physician Assistant

## 2024-11-15 ENCOUNTER — Ambulatory Visit

## 2024-11-19 ENCOUNTER — Encounter (HOSPITAL_BASED_OUTPATIENT_CLINIC_OR_DEPARTMENT_OTHER): Admitting: General Surgery
